# Patient Record
Sex: Male | Born: 1985
Health system: Southern US, Community
[De-identification: ages and names within clinical notes are randomized; demographics above are authoritative.]

## PROBLEM LIST (undated history)

## (undated) DIAGNOSIS — I428 Other cardiomyopathies: Secondary | ICD-10-CM

## (undated) DIAGNOSIS — F1410 Cocaine abuse, uncomplicated: Secondary | ICD-10-CM

## (undated) DIAGNOSIS — Z720 Tobacco use: Secondary | ICD-10-CM

## (undated) DIAGNOSIS — Z9889 Other specified postprocedural states: Secondary | ICD-10-CM

## (undated) DIAGNOSIS — I5020 Unspecified systolic (congestive) heart failure: Secondary | ICD-10-CM

## (undated) DIAGNOSIS — R002 Palpitations: Secondary | ICD-10-CM

## (undated) DIAGNOSIS — G4730 Sleep apnea, unspecified: Secondary | ICD-10-CM

## (undated) DIAGNOSIS — I509 Heart failure, unspecified: Secondary | ICD-10-CM

## (undated) DIAGNOSIS — I514 Myocarditis, unspecified: Secondary | ICD-10-CM

## (undated) HISTORY — DX: Unspecified systolic (congestive) heart failure: I50.20

## (undated) HISTORY — DX: Other cardiomyopathies: I42.8

## (undated) HISTORY — DX: Myocarditis, unspecified: I51.4

## (undated) HISTORY — PX: NO PAST SURGERIES: SHX2092

## (undated) HISTORY — DX: Other specified postprocedural states: Z98.890

## (undated) HISTORY — DX: Heart failure, unspecified: I50.9

## (undated) HISTORY — DX: Cocaine abuse, uncomplicated: F14.10

## (undated) HISTORY — PX: CORONARY ANGIOPLASTY: SHX604

---
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
## 2004-01-03 ENCOUNTER — Ambulatory Visit (HOSPITAL_COMMUNITY): Admission: RE | Admit: 2004-01-03 | Discharge: 2004-01-03 | Payer: Self-pay | Admitting: Internal Medicine

## 2005-03-10 ENCOUNTER — Emergency Department: Payer: Self-pay | Admitting: Unknown Physician Specialty

## 2006-03-17 HISTORY — PX: WISDOM TOOTH EXTRACTION: SHX21

## 2009-05-28 ENCOUNTER — Emergency Department: Payer: Self-pay | Admitting: Emergency Medicine

## 2012-08-03 ENCOUNTER — Emergency Department: Payer: Self-pay | Admitting: Emergency Medicine

## 2012-11-12 ENCOUNTER — Emergency Department: Payer: Self-pay | Admitting: Emergency Medicine

## 2013-07-19 ENCOUNTER — Emergency Department: Payer: Self-pay | Admitting: Emergency Medicine

## 2013-11-22 ENCOUNTER — Emergency Department: Payer: Self-pay | Admitting: Emergency Medicine

## 2014-04-23 ENCOUNTER — Emergency Department: Payer: Self-pay | Admitting: Emergency Medicine

## 2014-05-01 ENCOUNTER — Emergency Department: Payer: Self-pay | Admitting: Student

## 2014-09-04 ENCOUNTER — Emergency Department (HOSPITAL_COMMUNITY): Payer: Self-pay

## 2014-09-04 ENCOUNTER — Emergency Department (HOSPITAL_COMMUNITY)
Admission: EM | Admit: 2014-09-04 | Discharge: 2014-09-04 | Disposition: A | Discharge status: Home / Self Care | Payer: Self-pay | Attending: Emergency Medicine | Admitting: Emergency Medicine

## 2014-09-04 ENCOUNTER — Encounter (HOSPITAL_COMMUNITY): Payer: Self-pay | Admitting: *Deleted

## 2014-09-04 DIAGNOSIS — Z720 Tobacco use: Secondary | ICD-10-CM | POA: Insufficient documentation

## 2014-09-04 DIAGNOSIS — IMO0001 Reserved for inherently not codable concepts without codable children: Secondary | ICD-10-CM

## 2014-09-04 DIAGNOSIS — R1011 Right upper quadrant pain: Secondary | ICD-10-CM | POA: Insufficient documentation

## 2014-09-04 DIAGNOSIS — R630 Anorexia: Secondary | ICD-10-CM | POA: Insufficient documentation

## 2014-09-04 DIAGNOSIS — R030 Elevated blood-pressure reading, without diagnosis of hypertension: Secondary | ICD-10-CM | POA: Insufficient documentation

## 2014-09-04 LAB — COMPREHENSIVE METABOLIC PANEL
ALT: 56 U/L (ref 17–63)
AST: 88 U/L — ABNORMAL HIGH (ref 15–41)
Albumin: 3.5 g/dL (ref 3.5–5.0)
Alkaline Phosphatase: 87 U/L (ref 38–126)
Anion gap: 7 (ref 5–15)
BUN: 12 mg/dL (ref 6–20)
CO2: 28 mmol/L (ref 22–32)
Calcium: 8.9 mg/dL (ref 8.9–10.3)
Chloride: 104 mmol/L (ref 101–111)
Creatinine, Ser: 1.24 mg/dL (ref 0.61–1.24)
GFR calc Af Amer: >60 mL/min (ref >60)
GFR calc non Af Amer: >60 mL/min (ref >60)
Glucose, Bld: 107 mg/dL — ABNORMAL HIGH (ref 65–99)
Potassium: 3.6 mmol/L (ref 3.5–5.1)
Sodium: 139 mmol/L (ref 135–145)
Total Bilirubin: 0.5 mg/dL (ref 0.3–1.2)
Total Protein: 7 g/dL (ref 6.5–8.1)

## 2014-09-04 LAB — CBC WITH DIFFERENTIAL/PLATELET
Basophils Absolute: 0 10*3/uL (ref 0.0–0.1)
Basophils Relative: 1 % (ref 0–1)
Eosinophils Absolute: 0.1 10*3/uL (ref 0.0–0.7)
Eosinophils Relative: 1 % (ref 0–5)
HCT: 40.3 % (ref 39.0–52.0)
Hemoglobin: 13.7 g/dL (ref 13.0–17.0)
Lymphocytes Relative: 35 % (ref 12–46)
Lymphs Abs: 3.1 10*3/uL (ref 0.7–4.0)
MCH: 29.9 pg (ref 26.0–34.0)
MCHC: 34 g/dL (ref 30.0–36.0)
MCV: 88 fL (ref 78.0–100.0)
Monocytes Absolute: 0.5 10*3/uL (ref 0.1–1.0)
Monocytes Relative: 5 % (ref 3–12)
Neutro Abs: 5 10*3/uL (ref 1.7–7.7)
Neutrophils Relative %: 58 % (ref 43–77)
Platelets: 261 10*3/uL (ref 150–400)
RBC: 4.58 MIL/uL (ref 4.22–5.81)
RDW: 12.1 % (ref 11.5–15.5)
WBC: 8.7 10*3/uL (ref 4.0–10.5)

## 2014-09-04 LAB — LIPASE, BLOOD: Lipase: 47 U/L (ref 22–51)

## 2014-09-04 MED ORDER — MORPHINE SULFATE 4 MG/ML IJ SOLN
4.0000 mg | Freq: Once | INTRAMUSCULAR | Status: AC
  Administered 2014-09-04: 4 mg via INTRAVENOUS
  Filled 2014-09-04: qty 1

## 2014-09-04 MED ORDER — ONDANSETRON HCL 4 MG/2ML IJ SOLN
4.0000 mg | Freq: Once | INTRAMUSCULAR | Status: AC
  Administered 2014-09-04: 4 mg via INTRAVENOUS
  Filled 2014-09-04: qty 2

## 2014-09-04 MED ORDER — NAPROXEN 250 MG PO TABS: 250.0000 mg | ORAL_TABLET | Freq: Two times a day (BID) | ORAL | Status: DC

## 2014-09-04 MED ORDER — ONDANSETRON 4 MG PO TBDP
4.0000 mg | ORAL_TABLET | Freq: Three times a day (TID) | ORAL | Status: DC | PRN
Start: 2014-09-04 — End: 2016-07-10

## 2014-09-04 NOTE — ED Provider Notes (Signed)
CSN: [id number]     Arrival date & time 09/04/14  0654 History   First MD Initiated Contact with Patient 09/04/14 [phone]     Chief Complaint  Patient presents with  . Chest Pain   Carlos Brewer. is a 29 y.o. male who is otherwise healthy who presents to the ED complaining of right sided chest pain and right upper quadrant abdominal pain for the past 3 days and worsened last night. He his currently complaining of 7/10 right chest and RUQ pain that is worse with deep breath. He denies radiation to his back or shoulder. He describes his pain as pressure. He has taken nothing for pain today. He reports his pain increased last night about 4-5 hours after eating. He last ate a ham and cheese this morning at 0300. He denies previous abdominal surgeries. The patient denies personal or family history of MI. Patient is a smoker and smokes 5 cigarettes per day. Patient reports was utilized to enter the past days. The patient denies fevers, chills, injuries to his chest, shortness of breath, nausea, vomiting, diarrhea, constipation, palpitations, cough, hemoptysis, wheezing, urinary symptoms, previous abdominal surgeries. He denies history of hypertension.  (Consider location/radiation/quality/duration/timing/severity/associated sxs/prior Treatment) HPI  History reviewed. No pertinent past medical history. History reviewed. No pertinent past surgical history. History reviewed. No pertinent family history. History  Substance Use Topics  . Smoking status: Current Every Day Smoker -- 0.50 packs/day    Types: Cigarettes  . Smokeless tobacco: Never Used  . Alcohol Use: No    Review of Systems  Constitutional: Positive for appetite change (decrease ). Negative for fever and chills.  HENT: Negative for congestion and sore throat.   Eyes: Negative for visual disturbance.  Respiratory: Negative for cough, shortness of breath and wheezing.   Cardiovascular: Positive for chest pain. Negative for  palpitations and leg swelling.  Gastrointestinal: Positive for abdominal pain. Negative for nausea, vomiting and diarrhea.  Genitourinary: Negative for dysuria, hematuria and difficulty urinating.  Musculoskeletal: Negative for back pain and neck pain.  Skin: Negative for rash.  Neurological: Negative for dizziness, weakness and light-headedness.      Allergies  Review of patient's allergies indicates no known allergies.  Home Medications   Prior to Admission medications   Medication Sig Start Date End Date Taking? Authorizing Provider  naproxen (NAPROSYN) 250 MG tablet Take 1 tablet (250 mg total) by mouth 2 (two) times daily with a meal. 09/04/14   Waynetta Pean, PA-C  ondansetron (ZOFRAN ODT) 4 MG disintegrating tablet Take 1 tablet (4 mg total) by mouth every 8 (eight) hours as needed for nausea or vomiting. 09/04/14   Waynetta Pean, PA-C   BP 148/93 mmHg  Pulse 67  Temp(Src) 98.7 F (37.1 C) (Oral)  Resp 17  Ht 5\' 11"  (1.803 m)  Wt 280 lb (127.007 kg)  BMI 39.07 kg/m2  SpO2 95% Physical Exam  Constitutional: He is oriented to person, place, and time. He appears well-developed and well-nourished. No distress.  Nontoxic appearing.  HENT:  Head: Normocephalic and atraumatic.  Right Ear: External ear normal.  Left Ear: External ear normal.  Mouth/Throat: Oropharynx is clear and moist. No oropharyngeal exudate.  Eyes: Conjunctivae are normal. Pupils are equal, round, and reactive to light. Right eye exhibits no discharge. Left eye exhibits no discharge.  Neck: Normal range of motion. Neck supple. No JVD present. No tracheal deviation present.  Cardiovascular: Normal rate, regular rhythm, normal heart sounds and intact distal pulses.  Exam reveals no  gallop and no friction rub.   No murmur heard. Bilateral radial pulses are intact.  Pulmonary/Chest: Effort normal and breath sounds normal. No respiratory distress. He has no wheezes. He has no rales. He exhibits no tenderness.   Lungs are clear to auscultation bilaterally. No chest tenderness.  Abdominal: Soft. Bowel sounds are normal. He exhibits no distension and no mass. There is tenderness. There is no rebound and no guarding.  Abdomen is soft. Bowel sounds are present. Patient with right upper quadrant abdominal tenderness with positive Murphy's sign. No right lower quadrant tenderness. No epigastric tenderness. Negative psoas and obturator sign.  Musculoskeletal: He exhibits no edema or tenderness.  No lower extremity edema or tenderness.  Lymphadenopathy:    He has no cervical adenopathy.  Neurological: He is alert and oriented to person, place, and time. Coordination normal.  Skin: Skin is warm and dry. No rash noted. He is not diaphoretic. No erythema. No pallor.  Psychiatric: He has a normal mood and affect. His behavior is normal.  Nursing note and vitals reviewed.   ED Course  Procedures (including critical care time) Labs Review Labs Reviewed  COMPREHENSIVE METABOLIC PANEL - Abnormal; Notable for the following:    Glucose, Bld 107 (*)    AST 88 (*)    All other components within normal limits  LIPASE, BLOOD  CBC WITH DIFFERENTIAL/PLATELET    Imaging Review US Abdomen Complete  09/04/2014   CLINICAL DATA:  Right upper quadrant pain.  EXAM: ULTRASOUND ABDOMEN COMPLETE  COMPARISON:  None.  FINDINGS: Gallbladder: Gallbladder is partially contracted, the patient was not NPO. No gallstones noted. Gallbladder wall thickness 1.7 mm. Negative Murphy sign.  Common bile duct: Diameter: 6.1 mm  Liver: Liver is echogenic consistent with fatty infiltration. Knee area of focal fatty sparing appears be present adjacent to the gallbladder fossa.  IVC: No abnormality visualized.  Pancreas: Visualized portion unremarkable.  Spleen: Size and appearance within normal limits.  Right Kidney: Length: 11.5 cm. Echogenicity within normal limits. No mass or hydronephrosis visualized.  Left Kidney: Length: 11.6 cm.  Echogenicity within normal limits. No mass or hydronephrosis visualized.  Abdominal aorta: No aneurysm visualized.  Other findings: None.  IMPRESSION: Echogenic liver consistent with fatty infiltration and/or hepatocellular disease. Focal fatty sparing noted adjacent to the gallbladder fossa. Exam otherwise unremarkable.   Electronically Signed   By: Marcello Moores  Register   On: 09/04/2014 08:44     EKG Interpretation   Date/Time:  Monday September 04 2014 07:00:58 EDT Ventricular Rate:  87 PR Interval:  174 QRS Duration: 94 QT Interval:  378 QTC Calculation: 455 R Axis:   38 Text Interpretation:  Sinus rhythm Confirmed by ZAVITZ  MD, JOSHUA ([id number])  on 09/04/2014 7:05:09 AM      Filed Vitals:   09/04/14 [id number] 09/04/14 0715 09/04/14 0843 09/04/14 0845  BP: 155/108 146/105 140/103 148/93  Pulse: 89 83 73 67  Temp:      TempSrc:      Resp: 17 24 15 17   Height:      Weight:      SpO2: 99% 99% 98% 95%     MDM   Meds given in ED:  Medications  morphine 4 MG/ML injection 4 mg (4 mg Intravenous Given 09/04/14 0729)  ondansetron (ZOFRAN) injection 4 mg (4 mg Intravenous Given 09/04/14 0729)    New Prescriptions   NAPROXEN (NAPROSYN) 250 MG TABLET    Take 1 tablet (250 mg total) by mouth 2 (two) times daily with a  meal.   ONDANSETRON (ZOFRAN ODT) 4 MG DISINTEGRATING TABLET    Take 1 tablet (4 mg total) by mouth every 8 (eight) hours as needed for nausea or vomiting.    Final diagnoses:  RUQ abdominal pain  Elevated blood pressure   This is a 29 y.o. male who is otherwise healthy who presents to the ED complaining of right sided chest pain and right upper quadrant abdominal pain for the past 3 days and worsened last night. He his currently complaining of 7/10 right chest and RUQ pain that is worse with deep breath. He denies radiation to his back or shoulder. He describes his pain as pressure. He has taken nothing for pain today. He reports his pain increased last night about 4-5 hours after  eating. He last ate a ham and cheese this morning at 0300. On exam patient is afebrile and nontoxic appearing. His heart is regular rate and rhythm. He has no chest wall tenderness. Patient has right upper quadrant abdominal tenderness with a positive Murphy's sign. Patient denies previous abdominal surgeries. CMP is remarkable for mildly elevated AST at 88. His normal lipase. CBC is within normal limits. Abdominal ultrasound indicates an echogenic liver consistent fatty infiltration. There is focal fatty sparing noted adjacent to the gallbladder fossa. The exam is otherwise unremarkable. Is a normal common bile duct diameter. Normal gallbladder wall thickness. No gallstones noted. EKG shows normal sinus rhythm. Patient's pain is consistent with biliary colic. Extensive education provided on food choices to help with his pain. Patient given follow-up instructions with general surgery for further evaluation as well as his primary care provider to further manage his blood pressure. Patient denies history of hypertension. Patient needs to follow-up with his PCP for further check of his blood pressures. I advised the patient to follow-up with their primary care provider this week. I advised the patient to return to the emergency department with new or worsening symptoms or new concerns. The patient verbalized understanding and agreement with plan.    This patient was discussed with and evaluated by Dr. Reather Converse who agrees with assessment and plan.   Waynetta Pean, PA-C 09/04/14 [id number]  Elnora Morrison, MD 09/04/14 [id number]

## 2014-09-04 NOTE — ED Notes (Signed)
Patient transported to Ultrasound 

## 2014-09-04 NOTE — ED Notes (Signed)
Patient still out at [place].

## 2014-09-04 NOTE — ED Notes (Signed)
Patient returned from Ultrasound. 

## 2014-09-04 NOTE — ED Notes (Signed)
PA at bedside.

## 2014-09-04 NOTE — ED Notes (Signed)
Pt has been have right sided chest pain that starts under the breast and radiates down to the stomach. Denies NV and SOB. Pain comes and goes.

## 2014-09-04 NOTE — Discharge Instructions (Signed)
Abdominal Pain Many things can cause abdominal pain. Usually, abdominal pain is not caused by a disease and will improve without treatment. It can often be observed and treated at home. Your health care provider will do a physical exam and possibly order blood tests and X-rays to help determine the seriousness of your pain. However, in many cases, more time must pass before a clear cause of the pain can be found. Before that point, your health care provider may not know if you need more testing or further treatment. HOME CARE INSTRUCTIONS  Monitor your abdominal pain for any changes. The following actions may help to alleviate any discomfort you are experiencing:  Only take over-the-counter or prescription medicines as directed by your health care provider.  Do not take laxatives unless directed to do so by your health care provider.  Try a clear liquid diet (broth, tea, or water) as directed by your health care provider. Slowly move to a bland diet as tolerated. SEEK MEDICAL CARE IF:  You have unexplained abdominal pain.  You have abdominal pain associated with nausea or diarrhea.  You have pain when you urinate or have a bowel movement.  You experience abdominal pain that wakes you in the night.  You have abdominal pain that is worsened or improved by eating food.  You have abdominal pain that is worsened with eating fatty foods.  You have a fever. SEEK IMMEDIATE MEDICAL CARE IF:   Your pain does not go away within 2 hours.  You keep throwing up (vomiting).  Your pain is felt only in portions of the abdomen, such as the right side or the left lower portion of the abdomen.  You pass bloody or black tarry stools. MAKE SURE YOU:  Understand these instructions.   Will watch your condition.   Will get help right away if you are not doing well or get worse.  Document Released: 12/11/2004 Document Revised: 03/08/2013 Document Reviewed: 11/10/2012 [facility] Patient Information  2015 [place], [place]. This information is not intended to replace advice given to you by your health care provider. Make sure you discuss any questions you have with your health care provider.  Biliary Colic  Biliary colic is a steady or irregular pain in the upper abdomen. It is usually under the right side of the rib cage. It happens when gallstones interfere with the normal flow of bile from the gallbladder. Bile is a liquid that helps to digest fats. Bile is made in the liver and stored in the gallbladder. When you eat a meal, bile passes from the gallbladder through the cystic duct and the common bile duct into the small intestine. There, it mixes with partially digested food. If a gallstone blocks either of these ducts, the normal flow of bile is blocked. The muscle cells in the bile duct contract forcefully to try to move the stone. This causes the pain of biliary colic.  SYMPTOMS   A person with biliary colic usually complains of pain in the upper abdomen. This pain can be:  In the center of the upper abdomen just below the breastbone.  In the upper-right part of the abdomen, near the gallbladder and liver.  Spread back toward the right shoulder blade.  Nausea and vomiting.  The pain usually occurs after eating.  Biliary colic is usually triggered by the digestive system's demand for bile. The demand for bile is high after fatty meals. Symptoms can also occur when a person who has been fasting suddenly eats a very  large meal. Most episodes of biliary colic pass after 1 to 5 hours. After the most intense pain passes, your abdomen may continue to ache mildly for about 24 hours. DIAGNOSIS  After you describe your symptoms, your caregiver will perform a physical exam. He or she will pay attention to the upper right portion of your belly (abdomen). This is the area of your liver and gallbladder. An ultrasound will help your caregiver look for gallstones. Specialized scans of the gallbladder may  also be done. Blood tests may be done, especially if you have fever or if your pain persists. PREVENTION  Biliary colic can be prevented by controlling the risk factors for gallstones. Some of these risk factors, such as heredity, increasing age, and pregnancy are a normal part of life. Obesity and a high-fat diet are risk factors you can change through a healthy lifestyle. Women going through menopause who take hormone replacement therapy (estrogen) are also more likely to develop biliary colic. TREATMENT   Pain medication may be prescribed.  You may be encouraged to eat a fat-free diet.  If the first episode of biliary colic is severe, or episodes of colic keep retuning, surgery to remove the gallbladder (cholecystectomy) is usually recommended. This procedure can be done through small incisions using an instrument called a laparoscope. The procedure often requires a brief stay in the hospital. Some people can leave the hospital the same day. It is the most widely used treatment in people troubled by painful gallstones. It is effective and safe, with no complications in more than 90% of cases.  If surgery cannot be done, medication that dissolves gallstones may be used. This medication is expensive and can take months or years to work. Only small stones will dissolve.  Rarely, medication to dissolve gallstones is combined with a procedure called shock-wave lithotripsy. This procedure uses carefully aimed shock waves to break up gallstones. In many people treated with this procedure, gallstones form again within a few years. PROGNOSIS  If gallstones block your cystic duct or common bile duct, you are at risk for repeated episodes of biliary colic. There is also a 25% chance that you will develop a gallbladder infection(acute cholecystitis), or some other complication of gallstones within 10 to 20 years. If you have surgery, schedule it at a time that is convenient for you and at a time when you are  not sick. HOME CARE INSTRUCTIONS   Drink plenty of clear fluids.  Avoid fatty, greasy or fried foods, or any foods that make your pain worse.  Take medications as directed. SEEK MEDICAL CARE IF:   You develop a fever over 100.5 F (38.1 C).  Your pain gets worse over time.  You develop nausea that prevents you from eating and drinking.  You develop vomiting. SEEK IMMEDIATE MEDICAL CARE IF:   You have continuous or severe belly (abdominal) pain which is not relieved with medications.  You develop nausea and vomiting which is not relieved with medications.  You have symptoms of biliary colic and you suddenly develop a fever and shaking chills. This may signal cholecystitis. Call your caregiver immediately.  You develop a yellow color to your skin or the white part of your eyes (jaundice). Document Released: 08/04/2005 Document Revised: 05/26/2011 Document Reviewed: 10/14/2007 [facility] Patient Information 2015 [place], [place]. This information is not intended to replace advice given to you by your health care provider. Make sure you discuss any questions you have with your health care provider.   DASH Eating Plan  DASH stands for "Dietary Approaches to Stop Hypertension." The DASH eating plan is a healthy eating plan that has been shown to reduce high blood pressure (hypertension). Additional health benefits may include reducing the risk of type 2 diabetes mellitus, heart disease, and stroke. The DASH eating plan may also help with weight loss. WHAT DO I NEED TO KNOW ABOUT THE DASH EATING PLAN? For the DASH eating plan, you will follow these general guidelines:  Choose foods with a percent daily value for sodium of less than 5% (as listed on the food label).  Use salt-free seasonings or herbs instead of table salt or sea salt.  Check with your health care provider or pharmacist before using salt substitutes.  Eat lower-sodium products, often labeled as "lower sodium" or "no salt  added."  Eat fresh foods.  Eat more vegetables, fruits, and low-fat dairy products.  Choose whole grains. Look for the word "whole" as the first word in the ingredient list.  Choose fish and skinless chicken or [place] more often than red meat. Limit fish, poultry, and meat to 6 oz (170 g) each day.  Limit sweets, desserts, sugars, and sugary drinks.  Choose heart-healthy fats.  Limit cheese to 1 oz (28 g) per day.  Eat more home-cooked food and less restaurant, buffet, and fast food.  Limit fried foods.  Cook foods using methods other than frying.  Limit canned vegetables. If you do use them, rinse them well to decrease the sodium.  When eating at a restaurant, ask that your food be prepared with less salt, or no salt if possible. WHAT FOODS CAN I EAT? Seek help from a dietitian for individual calorie needs. Grains Whole grain or whole wheat bread. Brown rice. Whole grain or whole wheat pasta. Quinoa, bulgur, and whole grain cereals. Low-sodium cereals. Corn or whole wheat flour tortillas. Whole grain cornbread. Whole grain crackers. Low-sodium crackers. Vegetables Fresh or frozen vegetables (raw, steamed, roasted, or grilled). Low-sodium or reduced-sodium tomato and vegetable juices. Low-sodium or reduced-sodium tomato sauce and paste. Low-sodium or reduced-sodium canned vegetables.  Fruits All fresh, canned (in natural juice), or frozen fruits. Meat and Other Protein Products Ground beef (85% or leaner), grass-fed beef, or beef trimmed of fat. Skinless chicken or [place]. Ground chicken or [place]. Pork trimmed of fat. All fish and seafood. Eggs. Dried beans, peas, or lentils. Unsalted nuts and seeds. Unsalted canned beans. Dairy Low-fat dairy products, such as skim or 1% milk, 2% or reduced-fat cheeses, low-fat ricotta or cottage cheese, or plain low-fat yogurt. Low-sodium or reduced-sodium cheeses. Fats and Oils Tub margarines without trans fats. Light or reduced-fat  mayonnaise and salad dressings (reduced sodium). Avocado. Safflower, olive, or canola oils. Natural peanut or almond butter. Other Unsalted popcorn and pretzels. The items listed above may not be a complete list of recommended foods or beverages. Contact your dietitian for more options. WHAT FOODS ARE NOT RECOMMENDED? Grains White bread. White pasta. White rice. Refined cornbread. Bagels and croissants. Crackers that contain trans fat. Vegetables Creamed or fried vegetables. Vegetables in a cheese sauce. Regular canned vegetables. Regular canned tomato sauce and paste. Regular tomato and vegetable juices. Fruits Dried fruits. Canned fruit in light or heavy syrup. Fruit juice. Meat and Other Protein Products Fatty cuts of meat. Ribs, chicken wings, bacon, sausage, bologna, salami, chitterlings, fatback, hot dogs, bratwurst, and packaged luncheon meats. Salted nuts and seeds. Canned beans with salt. Dairy Whole or 2% milk, cream, half-and-half, and cream cheese. Whole-fat or sweetened yogurt. Full-fat cheeses or  blue cheese. Nondairy creamers and whipped toppings. Processed cheese, cheese spreads, or cheese curds. Condiments Onion and garlic salt, seasoned salt, table salt, and sea salt. Canned and packaged gravies. Worcestershire sauce. Tartar sauce. Barbecue sauce. Teriyaki sauce. Soy sauce, including reduced sodium. Steak sauce. Fish sauce. Oyster sauce. Cocktail sauce. Horseradish. Ketchup and mustard. Meat flavorings and tenderizers. Bouillon cubes. Hot sauce. Tabasco sauce. Marinades. Taco seasonings. Relishes. Fats and Oils Butter, stick margarine, lard, shortening, ghee, and bacon fat. Coconut, palm kernel, or palm oils. Regular salad dressings. Other Pickles and olives. Salted popcorn and pretzels. The items listed above may not be a complete list of foods and beverages to avoid. Contact your dietitian for more information. WHERE CAN I FIND MORE INFORMATION? National [employer], [employer], and  [employer] Institute: travelstabloid.com Document Released: 02/20/2011 Document Revised: 07/18/2013 Document Reviewed: 01/05/2013 [facility] Patient Information 2015 [place], [place]. This information is not intended to replace advice given to you by your health care provider. Make sure you discuss any questions you have with your health care provider.

## 2014-09-04 NOTE — ED Notes (Signed)
MD at bedside. 

## 2015-11-01 ENCOUNTER — Emergency Department
Admission: EM | Admit: 2015-11-01 | Discharge: 2015-11-01 | Disposition: A | Discharge status: Home / Self Care | Payer: Self-pay | Attending: Emergency Medicine | Admitting: Emergency Medicine

## 2015-11-01 ENCOUNTER — Emergency Department: Payer: Self-pay

## 2015-11-01 ENCOUNTER — Encounter: Payer: Self-pay | Admitting: Emergency Medicine

## 2015-11-01 DIAGNOSIS — R51 Headache: Secondary | ICD-10-CM | POA: Insufficient documentation

## 2015-11-01 DIAGNOSIS — R519 Headache, unspecified: Secondary | ICD-10-CM

## 2015-11-01 DIAGNOSIS — F17210 Nicotine dependence, cigarettes, uncomplicated: Secondary | ICD-10-CM | POA: Insufficient documentation

## 2015-11-01 DIAGNOSIS — R350 Frequency of micturition: Secondary | ICD-10-CM | POA: Insufficient documentation

## 2015-11-01 LAB — URINALYSIS COMPLETE WITH MICROSCOPIC (ARMC ONLY)
Bacteria, UA: NONE SEEN
Bilirubin Urine: NEGATIVE
Glucose, UA: NEGATIVE mg/dL
Hgb urine dipstick: NEGATIVE
Ketones, ur: NEGATIVE mg/dL
Leukocytes, UA: NEGATIVE
Nitrite: NEGATIVE
Protein, ur: NEGATIVE mg/dL
RBC / HPF: NONE SEEN RBC/hpf (ref 0–5)
Specific Gravity, Urine: 1.015 (ref 1.005–1.030)
Squamous Epithelial / LPF: NONE SEEN
pH: 7 (ref 5.0–8.0)

## 2015-11-01 LAB — CHLAMYDIA/NGC RT PCR (ARMC ONLY)
Chlamydia Tr: NOT DETECTED
N gonorrhoeae: NOT DETECTED

## 2015-11-01 LAB — GLUCOSE, CAPILLARY: Glucose-Capillary: 107 mg/dL — ABNORMAL HIGH (ref 65–99)

## 2015-11-01 MED ORDER — OXYCODONE-ACETAMINOPHEN 5-325 MG PO TABS
ORAL_TABLET | ORAL | Status: AC
  Filled 2015-11-01: qty 1

## 2015-11-01 MED ORDER — PROCHLORPERAZINE EDISYLATE 5 MG/ML IJ SOLN
10.0000 mg | Freq: Once | INTRAMUSCULAR | Status: AC
  Administered 2015-11-01: 10 mg via INTRAVENOUS
  Filled 2015-11-01: qty 2

## 2015-11-01 MED ORDER — SODIUM CHLORIDE 0.9 % IV BOLUS (SEPSIS)
1000.0000 mL | Freq: Once | INTRAVENOUS | Status: AC
  Administered 2015-11-01: 1000 mL via INTRAVENOUS

## 2015-11-01 MED ORDER — KETOROLAC TROMETHAMINE 30 MG/ML IJ SOLN
30.0000 mg | Freq: Once | INTRAMUSCULAR | Status: AC
  Administered 2015-11-01: 30 mg via INTRAVENOUS
  Filled 2015-11-01: qty 1

## 2015-11-01 MED ORDER — OXYCODONE-ACETAMINOPHEN 5-325 MG PO TABS
1.0000 | ORAL_TABLET | ORAL | Status: DC | PRN
  Administered 2015-11-01: 1 via ORAL

## 2015-11-01 MED ORDER — DIPHENHYDRAMINE HCL 50 MG/ML IJ SOLN
50.0000 mg | Freq: Once | INTRAMUSCULAR | Status: AC
  Administered 2015-11-01: 50 mg via INTRAVENOUS
  Filled 2015-11-01: qty 1

## 2015-11-01 NOTE — ED Triage Notes (Signed)
C/O headache since Sunday.  Also c/o nausea, chills, dizziness.  C/O pain to forehead and eyes.

## 2015-11-01 NOTE — ED Provider Notes (Signed)
[facility] Emergency Department Provider Note  ____________________________________________   First MD Initiated Contact with Patient 11/01/15 1525     (approximate)  I have reviewed the triage vital signs and the nursing notes.   HISTORY  Chief Complaint Migraine   HPI Carlos Brewer. is a 30 y.o. male without any chronic medical conditions was presenting to the emergency department with 3 days of worsening frontal headache. He says it feels like a throbbing and is a 78 out of 10 at this time. He says that it started out mild and has been progressively worse. Denies any fevers but says he has had chills. Says that he is also had some dizziness as well as nausea. Says that he does not a history of migraine headaches.  Denies any weakness or numbness. Says that he does feel restless.   History reviewed. No pertinent past medical history.  There are no active problems to display for this patient.   History reviewed. No pertinent surgical history.  Prior to Admission medications   Medication Sig Start Date End Date Taking? Authorizing Provider  naproxen (NAPROSYN) 250 MG tablet Take 1 tablet (250 mg total) by mouth 2 (two) times daily with a meal. 09/04/14   Waynetta Pean, PA-C  ondansetron (ZOFRAN ODT) 4 MG disintegrating tablet Take 1 tablet (4 mg total) by mouth every 8 (eight) hours as needed for nausea or vomiting. 09/04/14   Waynetta Pean, PA-C    Allergies Review of patient's allergies indicates no known allergies.  No family history on file.  Social History Social History  Substance Use Topics  . Smoking status: Current Every Day Smoker    Packs/day: 0.50    Types: Cigarettes  . Smokeless tobacco: Never Used  . Alcohol use No    Review of Systems Constitutional: No fever/chills Eyes: No visual changes. ENT: No sore throat. Cardiovascular: Denies chest pain. Respiratory: Denies shortness of breath. Gastrointestinal: No  abdominal pain.  no vomiting.  No diarrhea.  No constipation. Genitourinary: Negative for dysuria. Musculoskeletal: Negative for back pain. Skin: Negative for rash. Neurological: Negative for  focal weakness or numbness.  10-point ROS otherwise negative.  ____________________________________________   PHYSICAL EXAM:  VITAL SIGNS: ED Triage Vitals  Enc Vitals Group     BP 11/01/15 1337 (!) 139/103     Pulse Rate 11/01/15 1337 91     Resp 11/01/15 1337 20     Temp 11/01/15 1337 97.6 F (36.4 C)     Temp Source 11/01/15 1337 Oral     SpO2 11/01/15 1337 100 %     Weight 11/01/15 1337 270 lb (122.5 kg)     Height 11/01/15 1337 5\' 11"  (1.803 m)     Head Circumference --      Peak Flow --      Pain Score 11/01/15 1343 8     Pain Loc --      Pain Edu? --      Excl. in [place]? --     Constitutional: Alert and oriented. Well appearing and in no acute distress. Eyes: Conjunctivae are normal. PERRL. EOMI. Head: Atraumatic. Nose: No congestion/rhinnorhea. Mouth/Throat: Mucous membranes are moist.   Neck: No stridor.   Cardiovascular: Normal rate, regular rhythm. Grossly normal heart sounds.   Respiratory: Normal respiratory effort.  No retractions. Lungs CTAB. Gastrointestinal: Soft and nontender. No distention. Musculoskeletal: No lower extremity tenderness nor edema.  No joint effusions. Neurologic:  Normal speech and language. No gross focal neurologic deficits  are appreciated.  Skin:  Skin is warm, dry and intact. No rash noted. Psychiatric: Mood and affect are normal. Speech and behavior are normal.  ____________________________________________   LABS (all labs ordered are listed, but only abnormal results are displayed)  Labs Reviewed - No data to display ____________________________________________  EKG   ____________________________________________  RADIOLOGY  CT Head Wo Contrast (Accession [id number]) (Order [id number])  Imaging  Date: 11/01/2015 Department:  [facility] EMERGENCY DEPARTMENT Released By/Authorizing: Orbie Pyo, MD (auto-released)  PACS Images   Show images for CT Head Wo Contrast  Study Result   CLINICAL DATA:  Headache for 4 days. No relief with NSAIDs. Light sensitivity.  EXAM: CT HEAD WITHOUT CONTRAST  TECHNIQUE: Contiguous axial images were obtained from the base of the skull through the vertex without intravenous contrast.  COMPARISON:  None.  FINDINGS: Brain: No acute infarct, hemorrhage, or mass lesion is present. The ventricles are of normal size. No significant extraaxial fluid collection is present.  Vascular: No focal hyperdense vessels are present. There is no significant vascular calcification.  Skull: The calvarium is intact.  Sinuses/Orbits: The paranasal sinuses and mastoid air cells are clear. The globes and orbits are intact.  Other: No significant extracranial soft tissue lesion is present.  IMPRESSION: 1. Negative CT of the head.   Electronically Signed   By: San Morelle M.D.   On: 11/01/2015 16:44     ____________________________________________   PROCEDURES  Procedure(s) performed:  Angiocath insertion Performed by: Doran Stabler  Consent: Verbal consent obtained. Risks and benefits: risks, benefits and alternatives were discussed Time out: Immediately prior to procedure a "time out" was called to verify the correct patient, procedure, equipment, support staff and site/side marked as required.  Preparation: Patient was prepped and draped in the usual sterile fashion.  Vein Location: Right anterior fossa  Ultrasound Guided  Gauge: 20  Normal blood return and flush without difficulty Patient tolerance: Patient tolerated the procedure well with no immediate complications.    Procedures  Critical Care performed:   ____________________________________________   INITIAL IMPRESSION / ASSESSMENT AND PLAN /  ED COURSE  Pertinent labs & imaging results that were available during my care of the patient were reviewed by me and considered in my medical decision making (see chart for details).  ----------------------------------------- 7:01 PM on 11/01/2015 -----------------------------------------  Patient with resolution of his headache after the medicated. He is sedated after having the Benadryl but is arousable. Likely tension headache. Very reassuring CAT scan. Patient is saying that he has urinary frequency but with only very mildly elevated glucose and a reassuring urinalysis. I updated the patient about this as well as need for follow-up with primary care. The patient has primary care at the [facility]. He will be following her. Will be discharged home.  Clinical Course     ____________________________________________   FINAL CLINICAL IMPRESSION(S) / ED DIAGNOSES  Headache. Urinary frequency.    NEW MEDICATIONS STARTED DURING THIS VISIT:  New Prescriptions   No medications on file     Note:  This document was prepared using Dragon voice recognition software and may include unintentional dictation errors.    Orbie Pyo, MD 11/01/15 [phone]

## 2015-11-01 NOTE — ED Notes (Signed)
Pt in via triage with complaints of a headache since Sunday, taking ibuprofen but with no relief.  Pt states, "I just can't stay still."  Pt reports light sensitivity, denies any changes to vision.  Pt A/Ox4, no immediate distress at this time

## 2016-06-01 ENCOUNTER — Emergency Department
Admission: EM | Admit: 2016-06-01 | Discharge: 2016-06-01 | Disposition: A | Discharge status: Home / Self Care | Payer: Commercial Managed Care - PPO | Attending: Emergency Medicine | Admitting: Emergency Medicine

## 2016-06-01 ENCOUNTER — Emergency Department: Payer: Commercial Managed Care - PPO

## 2016-06-01 ENCOUNTER — Encounter: Payer: Self-pay | Admitting: Emergency Medicine

## 2016-06-01 DIAGNOSIS — X501XXA Overexertion from prolonged static or awkward postures, initial encounter: Secondary | ICD-10-CM | POA: Diagnosis not present

## 2016-06-01 DIAGNOSIS — S99911A Unspecified injury of right ankle, initial encounter: Secondary | ICD-10-CM | POA: Diagnosis present

## 2016-06-01 DIAGNOSIS — Y9389 Activity, other specified: Secondary | ICD-10-CM | POA: Diagnosis not present

## 2016-06-01 DIAGNOSIS — F17210 Nicotine dependence, cigarettes, uncomplicated: Secondary | ICD-10-CM | POA: Diagnosis not present

## 2016-06-01 DIAGNOSIS — Y929 Unspecified place or not applicable: Secondary | ICD-10-CM | POA: Insufficient documentation

## 2016-06-01 DIAGNOSIS — S93491A Sprain of other ligament of right ankle, initial encounter: Secondary | ICD-10-CM | POA: Insufficient documentation

## 2016-06-01 DIAGNOSIS — Y990 Civilian activity done for income or pay: Secondary | ICD-10-CM | POA: Insufficient documentation

## 2016-06-01 MED ORDER — IBUPROFEN 600 MG PO TABS: 600.0000 mg | ORAL_TABLET | Freq: Three times a day (TID) | ORAL | 0 refills | Status: DC | PRN

## 2016-06-01 NOTE — ED Notes (Signed)
When brace was put on patients rt. Ankle, pt. Stated noticeable decrease in pain.  Pt. Will follow up with PCP if condition worsens.

## 2016-06-01 NOTE — ED Notes (Signed)
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  Pt. Has swelling to outside of rt. Ankle.  No deformity noted.  No laceration noted.

## 2016-06-01 NOTE — Discharge Instructions (Signed)
It is normal for an ankle sprain to take up to a month to fully heal. Your pain will be worse tomorrow in the next day. Please keep your ankle wrapped and take ibuprofen as needed for pain control. Please establish care with a primary care physician within one week for recheck as needed.  It was a pleasure to take care of you today, and thank you for coming to our emergency department.  If you have any questions or concerns before leaving please ask the nurse to grab me and I'm more than happy to go through your aftercare instructions again.  If you were prescribed any opioid pain medication today such as Norco, Vicodin, Percocet, morphine, hydrocodone, or oxycodone please make sure you do not drive when you are taking this medication as it can alter your ability to drive safely.  If you have any concerns once you are home that you are not improving or are in fact getting worse before you can make it to your follow-up appointment, please do not hesitate to call 911 and come back for further evaluation.  Darel Hong MD  Results for orders placed or performed during the hospital encounter of 11/01/15  [place] rt PCR ([place] only)  Result Value Ref Range   Specimen source GC/Chlam ENDOCERVICAL    Chlamydia Tr NOT DETECTED NOT DETECTED   N gonorrhoeae NOT DETECTED NOT DETECTED  Glucose, capillary  Result Value Ref Range   Glucose-Capillary 107 (H) 65 - 99 mg/dL  Urinalysis complete, with microscopic ([facility] only)  Result Value Ref Range   Color, Urine YELLOW (A) YELLOW   APPearance CLOUDY (A) CLEAR   Glucose, UA NEGATIVE NEGATIVE mg/dL   Bilirubin Urine NEGATIVE NEGATIVE   Ketones, ur NEGATIVE NEGATIVE mg/dL   Specific Gravity, Urine 1.015 1.005 - 1.030   Hgb urine dipstick NEGATIVE NEGATIVE   pH 7.0 5.0 - 8.0   Protein, ur NEGATIVE NEGATIVE mg/dL   Nitrite NEGATIVE NEGATIVE   Leukocytes, UA NEGATIVE NEGATIVE   RBC / HPF NONE SEEN 0 - 5 RBC/hpf   WBC, UA 0-5 0 - 5 WBC/hpf   Bacteria, UA NONE SEEN NONE SEEN   Squamous Epithelial / LPF NONE SEEN NONE SEEN   Dg Ankle Complete Right  Result Date: 06/01/2016 CLINICAL DATA:  Ankle twisting injury EXAM: RIGHT ANKLE - COMPLETE 3+ VIEW COMPARISON:  None. FINDINGS: There is moderate lateral right ankle soft tissue swelling. The ankle mortise is approximated. There is no acute fracture. No ankle effusion. IMPRESSION: Moderate lateral right ankle soft tissue swelling. No acute osseous abnormality. Electronically Signed   By: Ulyses Jarred M.D.   On: 06/01/2016 01:30

## 2016-06-01 NOTE — ED Provider Notes (Addendum)
[facility] Emergency Department Provider Note  ____________________________________________   First MD Initiated Contact with Patient 06/01/16 [phone]     (approximate)  I have reviewed the triage vital signs and the nursing notes.   HISTORY  Chief Complaint Ankle Pain    HPI Carlos Brewer. is a 31 y.o. male who comes to the emergency department with moderate severity nonradiating sharp aching right lateral ankle pain that happened an hour prior to arrival when he rolled his ankle while getting out of a truck. He has been able to ambulate since. No knee pain. No numbness or weakness. He did not fall and strike his head.   History reviewed. No pertinent past medical history.  There are no active problems to display for this patient.   History reviewed. No pertinent surgical history.  Prior to Admission medications   Medication Sig Start Date End Date Taking? Authorizing Provider  ibuprofen (ADVIL,MOTRIN) 600 MG tablet Take 1 tablet (600 mg total) by mouth every 8 (eight) hours as needed. 06/01/16   Darel Hong, MD  naproxen (NAPROSYN) 250 MG tablet Take 1 tablet (250 mg total) by mouth 2 (two) times daily with a meal. 09/04/14   Waynetta Pean, PA-C  ondansetron (ZOFRAN ODT) 4 MG disintegrating tablet Take 1 tablet (4 mg total) by mouth every 8 (eight) hours as needed for nausea or vomiting. 09/04/14   Waynetta Pean, PA-C    Allergies Patient has no known allergies.  No family history on file.  Social History Social History  Substance Use Topics  . Smoking status: Current Every Day Smoker    Packs/day: 0.50    Types: Cigarettes  . Smokeless tobacco: Never Used  . Alcohol use No    Review of Systems Constitutional: No fever/chills Eyes: No visual changes. ENT: No sore throat. Cardiovascular: Denies chest pain. Respiratory: Denies shortness of breath. Gastrointestinal: No abdominal pain.  No nausea, no vomiting.  No diarrhea.  No  constipation. Genitourinary: Negative for dysuria. Musculoskeletal: Negative for back pain. Skin: Negative for rash. Neurological: Negative for headaches, focal weakness or numbness.  10-point ROS otherwise negative.  ____________________________________________   PHYSICAL EXAM:  VITAL SIGNS: ED Triage Vitals [06/01/16 0025]  Enc Vitals Group     BP      Pulse      Resp      Temp      Temp src      SpO2      Weight 210 lb (95.3 kg)     Height 5\' 11"  (1.803 m)     Head Circumference      Peak Flow      Pain Score 6     Pain Loc      Pain Edu?      Excl. in [place]?     Constitutional: Alert and oriented x 4 well appearing nontoxic no diaphoresis speaks in full, clear sentences Eyes: PERRL EOMI. Head: Atraumatic. Nose: No congestion/rhinnorhea. Mouth/Throat: No trismus Neck: No stridor.   Respiratory: Normal respiratory effort.  Musculoskeletal: No tenderness over medial mal some tenderness over lateral malleolus with significant tenderness over his ATFL. He can fire EHL, EDL, FHL, FDL, TA, G.  SILT sural, saphenous, tibial, dp, sp.  Skin closed, compartments soft No tenderness over proximal tibia or fibula Neurologic:  Normal speech and language. No gross focal neurologic deficits are appreciated. Skin:  Skin is warm, dry and intact. No rash noted. Psychiatric: Mood and affect are normal. Speech and behavior are  normal.    ____________________________________________   DIFFERENTIAL  Ankle sprain, ankle fracture, ankle dislocation ____________________________________________   LABS (all labs ordered are listed, but only abnormal results are displayed)  Labs Reviewed - No data to display   __________________________________________  EKG   ____________________________________________  RADIOLOGY  X-ray negative for fracture or dislocation ____________________________________________   PROCEDURES  Procedure(s) performed: yes  SPLINT  APPLICATION Authorized by: Darel Hong Consent: Verbal consent obtained. Risks and benefits: risks, benefits and alternatives were discussed Consent given by: patient Splint applied by: er technician Location details: right ankle Splint type: short leg wrap Post-procedure: The splinted body part was neurovascularly unchanged following the procedure. Patient tolerance: Patient tolerated the procedure well with no immediate complications.     Procedures  Critical Care performed: no  ____________________________________________   INITIAL IMPRESSION / ASSESSMENT AND PLAN / ED COURSE  Pertinent labs & imaging results that were available during my care of the patient were reviewed by me and considered in my medical decision making (see chart for details).  By the time I had seen the patient here any had x-rays which were negative for acute fracture dislocation. He is most tender over his ATFL which is consistent with an ankle sprain. He is able to bear weight but with some discomfort. Placed him in a splint for comfort and he will be discharged home with primary care follow-up. He is neurovascularly intact. He is stable to bear weight.      ____________________________________________   FINAL CLINICAL IMPRESSION(S) / ED DIAGNOSES  Final diagnoses:  Sprain of anterior talofibular ligament of right ankle, initial encounter      NEW MEDICATIONS STARTED DURING THIS VISIT:  Discharge Medication List as of 06/01/2016  3:54 AM    START taking these medications   Details  ibuprofen (ADVIL,MOTRIN) 600 MG tablet Take 1 tablet (600 mg total) by mouth every 8 (eight) hours as needed., Starting Sun 06/01/2016, Print         Note:  This document was prepared using Dragon voice recognition software and may include unintentional dictation errors.     Darel Hong, MD 06/05/16 [place], MD 06/05/16 [phone]

## 2016-06-01 NOTE — ED Triage Notes (Signed)
Patient states that he "rolled" his right ankle.

## 2016-06-23 ENCOUNTER — Emergency Department: Payer: Commercial Managed Care - PPO

## 2016-06-23 ENCOUNTER — Emergency Department
Admission: EM | Admit: 2016-06-23 | Discharge: 2016-06-23 | Disposition: A | Discharge status: Home / Self Care | Payer: Commercial Managed Care - PPO | Attending: Emergency Medicine | Admitting: Emergency Medicine

## 2016-06-23 ENCOUNTER — Encounter: Payer: Self-pay | Admitting: Emergency Medicine

## 2016-06-23 DIAGNOSIS — F17210 Nicotine dependence, cigarettes, uncomplicated: Secondary | ICD-10-CM | POA: Diagnosis not present

## 2016-06-23 DIAGNOSIS — R0789 Other chest pain: Secondary | ICD-10-CM | POA: Insufficient documentation

## 2016-06-23 DIAGNOSIS — R002 Palpitations: Secondary | ICD-10-CM | POA: Insufficient documentation

## 2016-06-23 DIAGNOSIS — R05 Cough: Secondary | ICD-10-CM | POA: Diagnosis not present

## 2016-06-23 DIAGNOSIS — R0602 Shortness of breath: Secondary | ICD-10-CM | POA: Insufficient documentation

## 2016-06-23 LAB — CBC
HCT: 42.6 % (ref 40.0–52.0)
Hemoglobin: 14.7 g/dL (ref 13.0–18.0)
MCH: 30.3 pg (ref 26.0–34.0)
MCHC: 34.4 g/dL (ref 32.0–36.0)
MCV: 88 fL (ref 80.0–100.0)
PLATELETS: 260 10*3/uL (ref 150–440)
RBC: 4.84 MIL/uL (ref 4.40–5.90)
RDW: 11.9 % (ref 11.5–14.5)
WBC: 8.7 10*3/uL (ref 3.8–10.6)

## 2016-06-23 LAB — BASIC METABOLIC PANEL
Anion gap: 7 (ref 5–15)
BUN: 12 mg/dL (ref 6–20)
CALCIUM: 9.4 mg/dL (ref 8.9–10.3)
CHLORIDE: 104 mmol/L (ref 101–111)
CO2: 27 mmol/L (ref 22–32)
CREATININE: 1.03 mg/dL (ref 0.61–1.24)
GFR calc non Af Amer: >60 mL/min (ref >60)
Glucose, Bld: 122 mg/dL — ABNORMAL HIGH (ref 65–99)
Potassium: 3.7 mmol/L (ref 3.5–5.1)
SODIUM: 138 mmol/L (ref 135–145)

## 2016-06-23 LAB — TROPONIN I

## 2016-06-23 LAB — TSH: TSH: 1.247 u[IU]/mL (ref 0.350–4.500)

## 2016-06-23 MED ORDER — ASPIRIN 81 MG PO CHEW
324.0000 mg | CHEWABLE_TABLET | Freq: Once | ORAL | Status: AC
  Administered 2016-06-23: 324 mg via ORAL

## 2016-06-23 MED ORDER — ASPIRIN 81 MG PO CHEW
CHEWABLE_TABLET | ORAL | Status: AC
  Filled 2016-06-23: qty 4

## 2016-06-23 NOTE — ED Provider Notes (Signed)
[facility] Emergency Department Provider Note  ____________________________________________  Time seen: Approximately 5:54 PM  I have reviewed the triage vital signs and the nursing notes.   HISTORY  Chief Complaint Chest Pain   HPI Carlos Brewer. is a 31 y.o. male no significant past medical history who presents for evaluation of of palpitations. Patient reports over the course of the last week he has had daily episodes where he feels his heart racing associated with left sided chest tightness/ squeezing. No dizziness, no syncopal episodes, no personal or family history of ischemic heart disease or sudden death. Patient is a smoker. He reports several episodes daily. Some episodes last 10 minutes, lasts a few hours. Today he had a more severe episode which was associated with shortness of breath. Patient is asymptomatic at this time. Patient also has had a cough productive of yellow phlegm over the course of the same week. No fever or chills, no wheezing, no nausea, no vomiting, no diarrhea.Patient reports that he drinks a lot of caffeine and multiple glasses of caffeinated soda daily.  History reviewed. No pertinent past medical history.  There are no active problems to display for this patient.   History reviewed. No pertinent surgical history.  Prior to Admission medications   Medication Sig Start Date End Date Taking? Authorizing Provider  ibuprofen (ADVIL,MOTRIN) 600 MG tablet Take 1 tablet (600 mg total) by mouth every 8 (eight) hours as needed. 06/01/16   Darel Hong, MD  naproxen (NAPROSYN) 250 MG tablet Take 1 tablet (250 mg total) by mouth 2 (two) times daily with a meal. 09/04/14   Waynetta Pean, PA-C  ondansetron (ZOFRAN ODT) 4 MG disintegrating tablet Take 1 tablet (4 mg total) by mouth every 8 (eight) hours as needed for nausea or vomiting. 09/04/14   Waynetta Pean, PA-C    Allergies Patient has no known allergies.  No family  history on file.  Social History Social History  Substance Use Topics  . Smoking status: Current Every Day Smoker    Packs/day: 0.50    Types: Cigarettes  . Smokeless tobacco: Never Used  . Alcohol use No    Review of Systems  Constitutional: Negative for fever. Eyes: Negative for visual changes. ENT: Negative for sore throat. Neck: No neck pain  Cardiovascular: + palpitations and chest tightness Respiratory: + shortness of breath. Gastrointestinal: Negative for abdominal pain, vomiting or diarrhea. Genitourinary: Negative for dysuria. Musculoskeletal: Negative for back pain. Skin: Negative for rash. Neurological: Negative for headaches, weakness or numbness. Psych: No SI or HI  ____________________________________________   PHYSICAL EXAM:  VITAL SIGNS: ED Triage Vitals  Enc Vitals Group     BP 06/23/16 1550 138/90     Pulse Rate 06/23/16 1550 (!) 102     Resp 06/23/16 1550 18     Temp 06/23/16 1550 98.6 F (37 C)     Temp Source 06/23/16 1550 Oral     SpO2 06/23/16 1550 96 %     Weight 06/23/16 1546 270 lb (122.5 kg)     Height 06/23/16 1546 5\' 11"  (1.803 m)     Head Circumference --      Peak Flow --      Pain Score 06/23/16 1546 4     Pain Loc --      Pain Edu? --      Excl. in [place]? --     Constitutional: Alert and oriented. Well appearing and in no apparent distress. HEENT:  Head: Normocephalic and atraumatic.         Eyes: Conjunctivae are normal. Sclera is non-icteric. EOMI. PERRL      Mouth/Throat: Mucous membranes are moist.       Neck: Supple with no signs of meningismus. Cardiovascular: Regular rate and rhythm. No murmurs, gallops, or rubs. 2+ symmetrical distal pulses are present in all extremities. No JVD. Respiratory: Normal respiratory effort. Lungs are clear to auscultation bilaterally. No wheezes, crackles, or rhonchi.  Gastrointestinal: Soft, non tender, and non distended with positive bowel sounds. No rebound or  guarding. Genitourinary: No CVA tenderness. Musculoskeletal: Nontender with normal range of motion in all extremities. No edema, cyanosis, or erythema of extremities. Neurologic: Normal speech and language. Face is symmetric. Moving all extremities. No gross focal neurologic deficits are appreciated. Skin: Skin is warm, dry and intact. No rash noted. Psychiatric: Mood and affect are normal. Speech and behavior are normal.  ____________________________________________   LABS (all labs ordered are listed, but only abnormal results are displayed)  Labs Reviewed  BASIC METABOLIC PANEL - Abnormal; Notable for the following:       Result Value   Glucose, Bld 122 (*)    All other components within normal limits  CBC  TROPONIN I  TROPONIN I  TSH   ____________________________________________  EKG  ED ECG REPORT I, Rudene Re, the attending physician, personally viewed and interpreted this ECG.  Normal sinus rhythm, normal axis, no STE or depressions, no evidence of HOCM, AV block, delta wave, ARVD, prolonged QTc, WPW. No changes when compared to EKG from 2016.  ____________________________________________  RADIOLOGY  CXR: negative  ____________________________________________   PROCEDURES  Procedure(s) performed: None Procedures Critical Care performed:  None ____________________________________________   INITIAL IMPRESSION / ASSESSMENT AND PLAN / ED COURSE   32 y.o. male no significant past medical history who presents for evaluation of of palpitations x 1 week. Patient is asymptomatic at this time. We'll watch patient on telemetry for any episodes. We'll check TSH, psych cardiac enzymes, check electrolytes, check blood work for anemia. If everything is negative patient be discharged with close follow-up with cardiology for a Holter monitor.     Clinical Course as of Jun 23 1949  Mon Jun 23, 2016  4827 Patient was monitored for 2.5 hours in the ED with no evidence  of arrhythmia. He remained stable with no CP, SOB or palpitations. Labs including troponin x 2 negative. Will dc with referral to Dr. Fletcher Anon for Holter monitoring. Discuss return precautions with patient for any episodes of CP, syncope, or new symptoms  [CV]    Clinical Course User Index [CV] Rudene Re, MD    Pertinent labs & imaging results that were available during my care of the patient were reviewed by me and considered in my medical decision making (see chart for details).    ____________________________________________   FINAL CLINICAL IMPRESSION(S) / ED DIAGNOSES  Final diagnoses:  Palpitations      NEW MEDICATIONS STARTED DURING THIS VISIT:  New Prescriptions   No medications on file     Note:  This document was prepared using Dragon voice recognition software and may include unintentional dictation errors.    Rudene Re, MD 06/23/16 [phone]

## 2016-06-23 NOTE — ED Notes (Signed)
States chest pain and SOB with cough, states "feeling as if his heart is racing" , states yellow productive cough, resp even and unlabored

## 2016-06-23 NOTE — ED Triage Notes (Signed)
Pt in via POV with complaints of intermittent left side chest pain x approximately one week.  Pt reports associated shortness of breath since this morning, denies any other symptoms.  Pt ambulatory to triage room without difficulty; NAD noted at this time.

## 2016-07-10 ENCOUNTER — Encounter: Payer: Self-pay | Admitting: Cardiology

## 2016-07-10 ENCOUNTER — Ambulatory Visit (INDEPENDENT_AMBULATORY_CARE_PROVIDER_SITE_OTHER): Payer: Commercial Managed Care - PPO | Admitting: Cardiology

## 2016-07-10 VITALS — BP 132/96 | HR 86 | Ht 71.0 in | Wt 288.5 lb

## 2016-07-10 DIAGNOSIS — Z6841 Body Mass Index (BMI) 40.0 and over, adult: Secondary | ICD-10-CM | POA: Diagnosis not present

## 2016-07-10 DIAGNOSIS — R002 Palpitations: Secondary | ICD-10-CM | POA: Diagnosis not present

## 2016-07-10 NOTE — Patient Instructions (Addendum)
Testing/Procedures: Decrease Caffeine and if symptoms persist the call to scheduled holter monitor  Your physician has recommended that you wear a holter monitor. Holter monitors are medical devices that record the heart's electrical activity. Doctors most often use these monitors to diagnose arrhythmias. Arrhythmias are problems with the speed or rhythm of the heartbeat. The monitor is a small, portable device. You can wear one while you do your normal daily activities. This is usually used to diagnose what is causing palpitations/syncope (passing out).    Follow-Up: Your physician recommends that you schedule a follow-up appointment as needed. Please call if you would like to schedule the holter monitor.    It was a pleasure seeing you today here in the office. Please do not hesitate to give [place] a call back if you have any further questions. Welch, BSN     Holter Monitoring A Holter monitor is a small device that is used to detect abnormal heart rhythms. It clips to your clothing and is connected by wires to flat, sticky disks (electrodes) that attach to your chest. It is worn continuously for 24-48 hours. Follow these instructions at home:  Wear your Holter monitor at all times, even while exercising and sleeping, for as long as directed by your health care provider.  Make sure that the Holter monitor is safely clipped to your clothing or close to your body as recommended by your health care provider.  Do not get the monitor or wires wet.  Do not put body lotion or moisturizer on your chest.  Keep your skin clean.  Keep a diary of your daily activities, such as walking and doing chores. If you feel that your heartbeat is abnormal or that your heart is fluttering or skipping a beat:  Record what you are doing when it happens.  Record what time of day the symptoms occur.  Return your Holter monitor as directed by your health care provider.  Keep all  follow-up visits as directed by your health care provider. This is important. Get help right away if:  You feel lightheaded or you faint.  You have trouble breathing.  You feel pain in your chest, upper arm, or jaw.  You feel sick to your stomach and your skin is pale, cool, or damp.  You heartbeat feels unusual or abnormal. This information is not intended to replace advice given to you by your health care provider. Make sure you discuss any questions you have with your health care provider. Document Released: 11/30/2003 Document Revised: 08/09/2015 Document Reviewed: 10/10/2013 Elsevier Interactive Patient Education  2017 [employer].

## 2016-07-10 NOTE — Progress Notes (Signed)
Cardiology Office Note   Date:  07/10/2016   ID:  Carlos Brewer., DOB 1985/07/29, MRN [id number]  Referring Doctor:  Lorelee Market, MD   Cardiologist:   Wende Bushy, MD   Reason for consultation:  Chief Complaint  Patient presents with  . other    ED f/u.  Pt c/o fast heart rate, states that when his emotions start to change thats when those episodes happen cocaine use but has stopped in the last 3 months.  Meds verbally reviewed with patient.       History of Present Illness: Carlos Brewer. is a 31 y.o. male who is being seen today for the evaluation of Palpitations at the request of Lorelee Market, MD.  Palpitations have been going on for 2 months now. Randomly occurring, symptoms on the chest, described as racing heartbeat, moderate intensity, last 15-20 minutes at a time. Denies chest pain and shortness of breath. No known precipitating or palliating factors. On further inquiry, patient reports that he was using cocaine, last time was in January. He did not have symptoms at that time.  Again on further inquiry, patient has been drinking probably more than 7 cans of regular soda a day. Once, he had an episode of fast heartbeat that woke him up from sleep and he had to sit up. He was given antacid by his wife/significant other, and he reported relief with that medication.  He was in the ER for 9 2018 for episode of palpitation. He was ruled out for acute coronary syndrome with 2 negative troponins, negative TSH as well.  He has not been to his PCP recently.  Otherwise, patient denies PND, orthopnea, edema. No chest pain, shortness of breath, lightheadedness.    ROS:  Please see the history of present illness. Aside from mentioned under HPI, all other systems are reviewed and negative.     History reviewed. No pertinent past medical history.  Past Surgical History:  Procedure Laterality Date  . WISDOM TOOTH EXTRACTION  2008     reports that he  has been smoking Cigarettes.  He has been smoking about 0.25 packs per day. He has never used smokeless tobacco. He reports that he does not drink alcohol or use drugs.   family history is not on file. No known CAD or SCD in the family  Outpatient Medications Prior to Visit  Medication Sig Dispense Refill  . ibuprofen (ADVIL,MOTRIN) 600 MG tablet Take 1 tablet (600 mg total) by mouth every 8 (eight) hours as needed. 30 tablet 0  . naproxen (NAPROSYN) 250 MG tablet Take 1 tablet (250 mg total) by mouth 2 (two) times daily with a meal. (Patient not taking: Reported on 07/10/2016) 30 tablet 0  . ondansetron (ZOFRAN ODT) 4 MG disintegrating tablet Take 1 tablet (4 mg total) by mouth every 8 (eight) hours as needed for nausea or vomiting. (Patient not taking: Reported on 07/10/2016) 10 tablet 0   No facility-administered medications prior to visit.      Allergies: Patient has no known allergies.    PHYSICAL EXAM: VS:  BP (!) 132/96 (BP Location: Left Arm, Patient Position: Sitting, Cuff Size: Large)   Pulse 86   Ht 5\' 11"  (1.803 m)   Wt 288 lb 8 oz (130.9 kg)   BMI 40.24 kg/m  , Body mass index is 40.24 kg/m. Wt Readings from Last 3 Encounters:  07/10/16 288 lb 8 oz (130.9 kg)  06/23/16 270 lb (122.5 kg)  06/01/16 210  lb (95.3 kg)    GENERAL:  well developed, well nourished, Morbidly obese, not in acute distress HEENT: normocephalic, pink conjunctivae, anicteric sclerae, no xanthelasma, normal dentition, oropharynx clear NECK:  no neck vein engorgement, JVP normal, no hepatojugular reflux, carotid upstroke brisk and symmetric, no bruit, no thyromegaly, no lymphadenopathy LUNGS:  good respiratory effort, clear to auscultation bilaterally CV:  PMI not displaced, no thrills, no lifts, S1 and S2 within normal limits, no palpable S3 or S4, no murmurs, no rubs, no gallops ABD:  Soft, nontender, nondistended, normoactive bowel sounds, no abdominal aortic bruit, no hepatomegaly, no  splenomegaly MS: nontender back, no kyphosis, no scoliosis, no joint deformities EXT:  2+ DP/PT pulses, no edema, no varicosities, no cyanosis, no clubbing SKIN: warm, nondiaphoretic, normal turgor, no ulcers NEUROPSYCH: alert, oriented to person, place, and time, sensory/motor grossly intact, normal mood, appropriate affect  Recent Labs: 06/23/2016: BUN 12; Creatinine, Ser 1.03; Hemoglobin 14.7; Platelets 260; Potassium 3.7; Sodium 138; TSH 1.247   Lipid Panel No results found for: CHOL, TRIG, HDL, CHOLHDL, VLDL, LDLCALC, LDLDIRECT   Other studies Reviewed:  EKG:  The ekg from 07/10/2016 was personally reviewed by me and it revealed sinus rhythm, 86 BPM.  Additional studies/ records that were reviewed personally reviewed by me today include: None available   ASSESSMENT AND PLAN: Palpitations Likely related to excessive intake of soda/caffeinated beverage. He reports that when he started decreasing the amount of soda that he drinks in a day, he noted some improvement in his palpitations. Recommend either doing a Holter now or wean off of the soda and do the Holter if his symptoms persisted. Patient would prefer that he try to wean off the soda and will call our office if he continued to have symptoms.  Morbid obesity Body mass index is 40.24 kg/m. Recommend aggressive weight loss through diet and increased physical activity.  Recommend regular follow up with PCP for screening blood work, etc. Patient verbalized understanding.   Current medicines are reviewed at length with the patient today.  The patient does not have concerns regarding medicines.  Labs/ tests ordered today include:  Orders Placed This Encounter  Procedures  . EKG 12-Lead    I had a lengthy and detailed discussion with the patient regarding diagnoses, prognosis, diagnostic options.  I counseled the patient on importance of lifestyle modification including heart healthy diet, regular physical  activity.   Disposition:   FU with Cardiology after tests   Thank you for this consultation. We will forwarding this consultation to referring physician.   I spent at least 60 minutes with the patient today and more than 50% of the time was spent counseling the patient and coordinating care.    Signed, Wende Bushy, MD  07/10/2016 11:10 AM    [place]  This note was generated in part with voice recognition software and I apologize for any typographical errors that were not detected and corrected.

## 2016-08-25 ENCOUNTER — Encounter: Payer: Self-pay | Admitting: Emergency Medicine

## 2016-08-25 ENCOUNTER — Emergency Department
Admission: EM | Admit: 2016-08-25 | Discharge: 2016-08-25 | Disposition: A | Discharge status: Home / Self Care | Payer: Commercial Managed Care - PPO | Attending: Emergency Medicine | Admitting: Emergency Medicine

## 2016-08-25 DIAGNOSIS — M5432 Sciatica, left side: Secondary | ICD-10-CM | POA: Insufficient documentation

## 2016-08-25 DIAGNOSIS — F17210 Nicotine dependence, cigarettes, uncomplicated: Secondary | ICD-10-CM | POA: Diagnosis not present

## 2016-08-25 DIAGNOSIS — M545 Low back pain: Secondary | ICD-10-CM | POA: Diagnosis present

## 2016-08-25 MED ORDER — PREDNISONE 10 MG (21) PO TBPK: ORAL_TABLET | ORAL | 0 refills | Status: DC

## 2016-08-25 MED ORDER — CYCLOBENZAPRINE HCL 10 MG PO TABS: 10.0000 mg | ORAL_TABLET | Freq: Three times a day (TID) | ORAL | 0 refills | Status: DC | PRN

## 2016-08-25 MED ORDER — HYDROCODONE-ACETAMINOPHEN 5-325 MG PO TABS: 1.0000 | ORAL_TABLET | ORAL | 0 refills | Status: DC | PRN

## 2016-08-25 NOTE — ED Provider Notes (Signed)
[facility] Emergency Department Provider Note ____________________________________________  Time seen: Approximately 4:53 PM  I have reviewed the triage vital signs and the nursing notes.   HISTORY  Chief Complaint Back Pain    HPI Carlos Alicea. is a 31 y.o. male who presents to the emergency department for evaluation of nontraumatic low back pain that started about 4 days ago. He states that he stepped off of an uneven surface and may have twisted his back, but didn't feel pain immediately afterward. Pain is in the left lower back and radiates into the left leg. He has not attempted any alleviating measures for this pain. He denies similar previous pain.  History reviewed. No pertinent past medical history.  There are no active problems to display for this patient.   Past Surgical History:  Procedure Laterality Date  . [place] EXTRACTION  2008    Prior to Admission medications   Medication Sig Start Date End Date Taking? Authorizing Provider  cyclobenzaprine (FLEXERIL) 10 MG tablet Take 1 tablet (10 mg total) by mouth 3 (three) times daily as needed for muscle spasms. 08/25/16   Lillieanna Tuohy, Johnette Abraham B, FNP  HYDROcodone-acetaminophen (NORCO/VICODIN) 5-325 MG tablet Take 1 tablet by mouth every 4 (four) hours as needed for moderate pain. 08/25/16 08/25/17  Jaliyah Fotheringham, Dessa Phi, FNP  ibuprofen (ADVIL,MOTRIN) 600 MG tablet Take 1 tablet (600 mg total) by mouth every 8 (eight) hours as needed. 06/01/16   Darel Hong, MD  predniSONE (STERAPRED UNI-PAK 21 TAB) 10 MG (21) TBPK tablet Take 6 tablets on day 1 Take 5 tablets on day 2 Take 4 tablets on day 3 Take 3 tablets on day 4 Take 2 tablets on day 5 Take 1 tablet on day 6 08/25/16   Sherrie George B, FNP    Allergies Patient has no known allergies.  No family history on file.  Social History Social History  Substance Use Topics  . Smoking status: Current Every Day Smoker    Packs/day: 0.25   Types: Cigarettes  . Smokeless tobacco: Never Used     Comment: about 5 cigarettes day.    . Alcohol use No     Comment: none    Review of Systems Constitutional: Negative for recent illness or injury Respiratory: Negative for cough or shortness of breath. Musculoskeletal: Positive for lower back pain. Skin: Negative for rash, lesion, or wound.  Neurological: Positive for radiculopathy--left lower back into posterior upper left leg  ____________________________________________   PHYSICAL EXAM:  VITAL SIGNS: ED Triage Vitals  Enc Vitals Group     BP 08/25/16 1620 127/88     Pulse Rate 08/25/16 1620 87     Resp 08/25/16 1620 20     Temp 08/25/16 1620 98.2 F (36.8 C)     Temp Source 08/25/16 1620 Oral     SpO2 08/25/16 1620 98 %     Weight 08/25/16 1620 280 lb (127 kg)     Height 08/25/16 1620 5\' 11"  (1.803 m)     Head Circumference --      Peak Flow --      Pain Score 08/25/16 1622 10     Pain Loc --      Pain Edu? --      Excl. in [place]? --     Constitutional: Alert and oriented. Well appearing and in no acute distress. Eyes: Conjunctivae are normal without erythema or drainage.  Head: Atraumatic Neck: Active, full range of motion. Respiratory: Respirations even and unlabored.  Musculoskeletal: Tender over the left lower back. Neurologic: Straight leg raise is positive on the left at about 40*.  Skin: Atraumatic  Psychiatric: Flat affect. Normal behavior.  ____________________________________________   LABS (all labs ordered are listed, but only abnormal results are displayed)  Labs Reviewed - No data to display ____________________________________________  RADIOLOGY  Not indicated. ____________________________________________   PROCEDURES  Procedure(s) performed: None  ____________________________________________   INITIAL IMPRESSION / ASSESSMENT AND PLAN / ED COURSE  Carlos Baston. is a 31 y.o. male who presents to the emergency department  for evaluation of lower back pain. Symptoms and exam are consistent with sciatica and he will be treated as such. Prednisone, flexeril, and norco prescriptions written. He is to follow up with his PCP for symptoms that are not improving over the week or return to the ER for symptoms that change or worsen if unable to schedule an appointment.   Pertinent labs & imaging results that were available during my care of the patient were reviewed by me and considered in my medical decision making (see chart for details).  _________________________________________   FINAL CLINICAL IMPRESSION(S) / ED DIAGNOSES  Final diagnoses:  Sciatica of left side    Discharge Medication List as of 08/25/2016  5:00 PM    START taking these medications   Details  cyclobenzaprine (FLEXERIL) 10 MG tablet Take 1 tablet (10 mg total) by mouth 3 (three) times daily as needed for muscle spasms., Starting Mon 08/25/2016, Print    HYDROcodone-acetaminophen (NORCO/VICODIN) 5-325 MG tablet Take 1 tablet by mouth every 4 (four) hours as needed for moderate pain., Starting Mon 08/25/2016, Until Tue 08/25/2017, Print    predniSONE (STERAPRED UNI-PAK 21 TAB) 10 MG (21) TBPK tablet Take 6 tablets on day 1 Take 5 tablets on day 2 Take 4 tablets on day 3 Take 3 tablets on day 4 Take 2 tablets on day 5 Take 1 tablet on day 6, Print        If controlled substance prescribed during this visit, 12 month history viewed on the [place] prior to issuing an initial prescription for Schedule II or III opiod.    Victorino Dike, FNP 08/25/16 1901    Orbie Pyo, MD 08/25/16 2112

## 2016-08-25 NOTE — ED Notes (Addendum)
See triage note  Developed lower back pain about 4 days ago   Unsure of injury  But may have twisted back when he stepped wrong. States pain moves into left leg Ambulates well to treatment room

## 2016-08-25 NOTE — ED Triage Notes (Signed)
Low back pain x 4 days, twisted while walking.

## 2016-10-12 ENCOUNTER — Ambulatory Visit (INDEPENDENT_AMBULATORY_CARE_PROVIDER_SITE_OTHER): Payer: Commercial Managed Care - PPO

## 2016-10-12 ENCOUNTER — Ambulatory Visit
Admission: EM | Admit: 2016-10-12 | Discharge: 2016-10-12 | Disposition: A | Discharge status: Home / Self Care | Payer: Commercial Managed Care - PPO | Attending: Registered Nurse | Admitting: Registered Nurse

## 2016-10-12 DIAGNOSIS — J301 Allergic rhinitis due to pollen: Secondary | ICD-10-CM

## 2016-10-12 DIAGNOSIS — H65112 Acute and subacute allergic otitis media (mucoid) (sanguinous) (serous), left ear: Secondary | ICD-10-CM | POA: Diagnosis not present

## 2016-10-12 DIAGNOSIS — J181 Lobar pneumonia, unspecified organism: Secondary | ICD-10-CM

## 2016-10-12 DIAGNOSIS — R05 Cough: Secondary | ICD-10-CM

## 2016-10-12 DIAGNOSIS — R0602 Shortness of breath: Secondary | ICD-10-CM | POA: Diagnosis not present

## 2016-10-12 DIAGNOSIS — J189 Pneumonia, unspecified organism: Secondary | ICD-10-CM

## 2016-10-12 MED ORDER — SALINE SPRAY 0.65 % NA SOLN: 2.0000 | NASAL | 0 refills | Status: DC

## 2016-10-12 MED ORDER — IPRATROPIUM-ALBUTEROL 0.5-2.5 (3) MG/3ML IN SOLN
3.0000 mL | Freq: Once | RESPIRATORY_TRACT | Status: AC
  Administered 2016-10-12: 3 mL via RESPIRATORY_TRACT

## 2016-10-12 MED ORDER — FLUTICASONE PROPIONATE 50 MCG/ACT NA SUSP: 1.0000 | Freq: Two times a day (BID) | NASAL | 0 refills | Status: DC

## 2016-10-12 MED ORDER — DOXYCYCLINE HYCLATE 100 MG PO CAPS: 100.0000 mg | ORAL_CAPSULE | Freq: Two times a day (BID) | ORAL | 0 refills | Status: DC

## 2016-10-12 MED ORDER — LORATADINE 10 MG PO TABS: 10.0000 mg | ORAL_TABLET | Freq: Every day | ORAL | 1 refills | Status: DC

## 2016-10-12 MED ORDER — AMOXICILLIN-POT CLAVULANATE 875-125 MG PO TABS: 1.0000 | ORAL_TABLET | Freq: Two times a day (BID) | ORAL | 0 refills | Status: DC

## 2016-10-12 MED ORDER — BENZONATATE 100 MG PO CAPS: 100.0000 mg | ORAL_CAPSULE | Freq: Three times a day (TID) | ORAL | 0 refills | Status: DC

## 2016-10-12 MED ORDER — ACETAMINOPHEN 500 MG PO TABS: 1000.0000 mg | ORAL_TABLET | Freq: Four times a day (QID) | ORAL | 0 refills | Status: DC | PRN

## 2016-10-12 NOTE — ED Provider Notes (Signed)
CSN: [id number]     Arrival date & time 10/12/16  1015 History   First MD Initiated Contact with Patient 10/12/16 1034     Chief Complaint  Patient presents with  . Cough  . Nasal Congestion  . Shortness of Breath   (Consider location/radiation/quality/duration/timing/severity/associated sxs/prior Treatment) 31y/o married african [place] male new patient here for evaluation SOB that started today along with  Productive cough x 1 week a lot of phelgm.  PMHx obesity, palpitations, anxiety, allergies pollen.  Has tried claritin and mucinex in the past for allergies none in the past month.  Denied others sick at home or work.   Has felt hot the past week.  Relationship stressors.  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  Was seen at ER a month ago for back strain.  Hx cocaine use denied use x 4 months.      History reviewed. No pertinent past medical history. Past Surgical History:  Procedure Laterality Date  . [place] EXTRACTION  2008   History reviewed. No pertinent family history. Social History  Substance Use Topics  . Smoking status: Current Every Day Smoker    Packs/day: 0.25    Types: Cigarettes  . Smokeless tobacco: Never Used     Comment: about 5 cigarettes day.    . Alcohol use No     Comment: none    Review of Systems  Constitutional: Positive for fever. Negative for activity change, appetite change, chills, diaphoresis, fatigue and unexpected weight change.  HENT: Positive for congestion, postnasal drip and rhinorrhea. Negative for dental problem, drooling, ear discharge, ear pain, facial swelling, hearing loss, mouth sores, nosebleeds, sinus pain, sinus pressure, sneezing, sore throat, tinnitus, trouble swallowing and voice change.   Eyes: Negative for photophobia, pain, discharge, redness, itching and visual disturbance.  Respiratory: Positive for cough, shortness of breath and wheezing. Negative for choking, chest tightness and stridor.   Cardiovascular: Negative for  chest pain, palpitations and leg swelling.  Gastrointestinal: Negative for abdominal distention, abdominal pain, blood in stool, constipation, diarrhea, nausea and vomiting.  Endocrine: Negative for cold intolerance and heat intolerance.  Genitourinary: Negative for dysuria.  Musculoskeletal: Negative for arthralgias, back pain, gait problem, joint swelling, myalgias, neck pain and neck stiffness.  Skin: Negative for color change, pallor, rash and wound.  Allergic/Immunologic: Positive for environmental allergies. Negative for food allergies and immunocompromised state.  Neurological: Negative for dizziness, tremors, seizures, syncope, facial asymmetry, speech difficulty, weakness, light-headedness, numbness and headaches.  Hematological: Negative for adenopathy. Does not bruise/bleed easily.  Psychiatric/Behavioral: Negative for agitation, behavioral problems, confusion and sleep disturbance.    Allergies  Patient has no known allergies.  Home Medications   Prior to Admission medications   Medication Sig Start Date End Date Taking? Authorizing Provider  acetaminophen (TYLENOL) 500 MG tablet Take 2 tablets (1,000 mg total) by mouth every 6 (six) hours as needed. 10/12/16   Betancourt, Aura Fey, NP  amoxicillin-clavulanate (AUGMENTIN) 875-125 MG tablet Take 1 tablet by mouth every 12 (twelve) hours. 10/12/16   Betancourt, Aura Fey, NP  benzonatate (TESSALON) 100 MG capsule Take 1 capsule (100 mg total) by mouth every 8 (eight) hours. 10/12/16   Betancourt, Aura Fey, NP  doxycycline (VIBRAMYCIN) 100 MG capsule Take 1 capsule (100 mg total) by mouth 2 (two) times daily. 10/12/16   Betancourt, Aura Fey, NP  fluticasone (FLONASE) 50 MCG/ACT nasal spray Place 1 spray into both nostrils 2 (two) times daily. 10/12/16   Betancourt, Aura Fey, NP  loratadine (CLARITIN) 10  MG tablet Take 1 tablet (10 mg total) by mouth daily. 10/12/16   Betancourt, Aura Fey, NP  sodium chloride (OCEAN) 0.65 % SOLN nasal spray Place 2  sprays into both nostrils every 2 (two) hours while awake. 10/12/16 11/11/16  BetancourtAura Fey, NP   Meds Ordered and Administered this Visit   Medications  ipratropium-albuterol (DUONEB) 0.5-2.5 (3) MG/3ML nebulizer solution 3 mL (3 mLs Nebulization Given 10/12/16 1058)    BP (!) 141/115 (BP Location: Left Arm)   Pulse 99   Temp 97.9 F (36.6 C) (Oral)   Resp 20   Ht 6' (1.829 m)   Wt 280 lb (127 kg)   SpO2 98%   BMI 37.97 kg/m  No data found.   Physical Exam  Constitutional: He is oriented to person, place, and time. Vital signs are normal. He appears well-developed and well-nourished. He is active and cooperative.  Non-toxic appearance. He does not have a sickly appearance. He appears ill. No distress.  HENT:  Head: Normocephalic and atraumatic.  Right Ear: Hearing, external ear and ear canal normal. A middle ear effusion is present.  Left Ear: Hearing, external ear and ear canal normal. Tympanic membrane is erythematous and bulging. A middle ear effusion is present.  Nose: Mucosal edema and rhinorrhea present. No nose lacerations, sinus tenderness, nasal deformity, septal deviation or nasal septal hematoma. No epistaxis.  No foreign bodies. Right sinus exhibits no maxillary sinus tenderness and no frontal sinus tenderness. Left sinus exhibits no maxillary sinus tenderness and no frontal sinus tenderness.  Mouth/Throat: Uvula is midline and mucous membranes are normal. Mucous membranes are not pale, not dry and not cyanotic. He does not have dentures. No oral lesions. No trismus in the jaw. Normal dentition. No dental abscesses, uvula swelling, lacerations or dental caries. Posterior oropharyngeal edema and posterior oropharyngeal erythema present. No oropharyngeal exudate or tonsillar abscesses. Tonsils are 2+ on the right. Tonsils are 2+ on the left.  Bilateral allergic shiners; lower eyelid swelling nonpitting 1-2+/4 bilaterally; cobblestoning posterior pharynx; bilateral TMs with  air fluid level left with erythema centrally; tonsils 2+/4 edema erythema;   Eyes: Pupils are equal, round, and reactive to light. Conjunctivae, EOM and lids are normal. Right eye exhibits no chemosis, no discharge, no exudate and no hordeolum. No foreign body present in the right eye. Left eye exhibits no chemosis, no discharge, no exudate and no hordeolum. No foreign body present in the left eye. Right conjunctiva is not injected. Right conjunctiva has no hemorrhage. Left conjunctiva is not injected. Left conjunctiva has no hemorrhage. No scleral icterus. Right eye exhibits normal extraocular motion and no nystagmus. Left eye exhibits normal extraocular motion and no nystagmus. Right pupil is round and reactive. Left pupil is round and reactive. Pupils are equal.  Neck: Trachea normal and normal range of motion. Neck supple. No tracheal tenderness, no spinous process tenderness and no muscular tenderness present. No neck rigidity. No tracheal deviation, no edema, no erythema and normal range of motion present. No thyroid mass and no thyromegaly present.  Cardiovascular: Normal rate, regular rhythm, S1 normal, S2 normal, normal heart sounds and intact distal pulses.  PMI is not displaced.  Exam reveals no gallop and no friction rub.   No murmur heard. Pulses:      Radial pulses are 2+ on the right side, and 2+ on the left side.  Pulmonary/Chest: Effort normal. No stridor. No respiratory distress. He has no decreased breath sounds. He has no wheezes. He has no rhonchi.  He has rales in the left middle field and the left lower field.  Frequent nonproductive cough noted in exam room; rales that cleared with coughing LMF and LLF; negative egophany all fields  Abdominal: Soft. Normal appearance. He exhibits no distension, no fluid wave and no ascites. There is no rigidity and no guarding. Hernia confirmed negative in the ventral area.  Musculoskeletal: Normal range of motion. He exhibits no edema or tenderness.        Right shoulder: Normal.       Left shoulder: Normal.       Right elbow: Normal.      Left elbow: Normal.       Right hip: Normal.       Left hip: Normal.       Right knee: Normal.       Left knee: Normal.       Cervical back: Normal.       Thoracic back: He exhibits pain. He exhibits normal range of motion, no tenderness, no bony tenderness, no swelling, no edema, no deformity, no laceration, no spasm and normal pulse.       Lumbar back: Normal.       Back:       Right hand: Normal.       Left hand: Normal.  Pain with cough mid thoracic left  Lymphadenopathy:       Head (right side): No submental, no submandibular, no tonsillar, no preauricular, no posterior auricular and no occipital adenopathy present.       Head (left side): No submental, no submandibular, no tonsillar, no preauricular, no posterior auricular and no occipital adenopathy present.    He has no cervical adenopathy.       Right cervical: No superficial cervical, no deep cervical and no posterior cervical adenopathy present.      Left cervical: No superficial cervical, no deep cervical and no posterior cervical adenopathy present.  Neurological: He is alert and oriented to person, place, and time. He has normal strength. He displays no atrophy and no tremor. No cranial nerve deficit or sensory deficit. He exhibits normal muscle tone. He displays no seizure activity. Coordination and gait normal. GCS eye subscore is 4. GCS verbal subscore is 5. GCS motor subscore is 6.  In/out of chair without difficulty; gait sure and smooth steady in hall  Skin: Skin is warm, dry and intact. Capillary refill takes less than 2 seconds. No abrasion, no bruising, no burn, no ecchymosis, no laceration, no lesion, no petechiae and no rash noted. He is not diaphoretic. No cyanosis or erythema. No pallor. Nails show no clubbing.  Psychiatric: He has a normal mood and affect. His speech is normal and behavior is normal. Judgment and thought  content normal. He is not actively hallucinating. Cognition and memory are normal. He is attentive.  Nursing note and vitals reviewed.   Urgent Care Course     Procedures (including critical care time)  Labs Review Labs Reviewed - No data to display  Imaging Review Dg Chest 2 View  Result Date: 10/12/2016 CLINICAL DATA:  Shortness of breath, cough EXAM: CHEST  2 VIEW COMPARISON:  06/23/2016 FINDINGS: Patchy bilateral airspace opacities in the lower lobes. Heart is mildly enlarged. Mild peribronchial thickening. No effusions or acute bony abnormality. IMPRESSION: Cardiomegaly. Bronchitic changes. Patchy bilateral lower lobe airspace opacities concerning for pneumonia. Electronically Signed   By: Rolm Baptise M.D.   On: 10/12/2016 11:05    CXR completed ambulatory to radiology without difficulty  Duoneb completed at 1058 by RN Norm Parcel.  1125 re-evaluated patient improved airflow BLL rare rales with inspiration.  Cough decreased after duoneb.  Patient reported he is feeling better pain and SOB decreased.  Discussed CXR results pneumonia and bronchitis and given copy of report.  Discussed treatment with patient agreed with plan of care except refused albuterol MDI Rx.  Will use OTC cough suppressant prn.  Work excuse for 48 hours. Spouse and children minors in room with patient now.  Spouse and  Patient verbalized understanding information/instructions, agreed with plan of care and had no further questions at this time.   MDM   1. Community acquired pneumonia of left lower lobe of lung ([place])   2. Acute allergic otitis media of left ear, recurrence not specified   3. Seasonal allergic rhinitis due to pollen    augmentin 875mg  po BID x 10 days #20 RF0 Treatment as ordered.  Symptomatic therapy suggested fluids, NSAIDs and rest.  May take Tylenol or Motrin for fevers.  Call or return to clinic as needed if these symptoms worsen or fail to improve as anticipated. Exitcare handout on  otitis media given to patient.  Patient verbalized agreement and understanding of treatment plan.   P2:  Hand washing    Patient may use normal saline nasal spray as needed.  Restart flonase 1 spray each nostril BID, saline 2 sprays each nostril q2h prn congestion and restart claritin 10mg  po daily #30 Rf0.  Avoid triggers if possible.  Shower prior to bedtime if exposed to triggers.  If allergic dust/dust mites recommend mattress/pillow covers/encasements; washing linens, vacuuming, sweeping, dusting weekly.  Call or return to clinic as needed if these symptoms worsen or fail to improve as anticipated.   Exitcare handout on sinus rinse, allergic rhinitis given to patient.  Patient verbalized understanding of instructions, agreed with plan of care and had no further questions at this time.  P2:  Avoidance and hand washing.  Refused steroids, albuterol inhaler.  Bronchitis simple, community acquired, may have started as viral (probably respiratory syncytial, parainfluenza, influenza, or adenovirus), but now evidence of acute purulent bronchitis with resultant bronchial edema and mucus formation.  Viruses are the most common cause of bronchial inflammation in otherwise healthy adults with acute bronchitis.  The appearance of sputum is not predictive of whether a bacterial infection is present.  Purulent sputum is most often caused by viral infections.  There are a small portion of those caused by non-viral agents being Mycoplamsa pneumonia.  Microscopic examination or C&S of sputum in the healthy adult with acute bronchitis is generally not helpful (usually negative or normal respiratory flora) other considerations being cough from upper respiratory tract infections, sinusitis or allergic syndromes (mild asthma or viral pneumonia).  Differential Diagnosis:  reactive airway disease (asthma, allergic aspergillosis (eosinophilia), chronic bronchitis, respiratory infection (Sinusitis, Common cold, pneumonia),  congestive heart failure, reflux esophagitis, bronchogenic tumor, aspiration syndromes and/or exposure irritants/tobacco smoke.  In this case, there is no evidence of any invasive bacterial illness.  Most likely viral etiology so will hold on antibiotic treatment.  Advise supportive care with rest, encourage fluids, good hygiene and watch for any worsening symptoms.  If they were to develop:  come back to the office or go to the emergency room if after hours. Without high fever, severe dyspnea, lack of physical findings or other risk factors, I will hold on  CBC at this time. I discussed that approximately 50% of patients with acute bronchitis have a cough  that lasts up to three weeks, and 25% for over a month.  Tylenol, one to two tablets every four hours as needed for fever or myalgias.   No aspirin.  Patient instructed to follow up in one week or sooner if symptoms worsen. Patient verbalized agreement and understanding of treatment plan.  P2:  hand washing and cover cough  Pneumonia community acquired simple, community acquired, may have started as viral (probably respiratory syncytial, parainfluenza, influenza, or adenovirus), but now evidence of acute periobronchial pneumonia BLL on chest xray.  Doxycycline 100mg  po BID x 10 days #20 RF0 and augmentin 875mg  po BID x 10 days #20 RF0 per up to date guidelines.  Differential Diagnoses:  Reactive Airway Disease (asthma, allergic aspergillosis eosinophilia), chronic bronchitis, respiratory infection (sinusitis, common cold, pneumonia), congestive heart failure, smoke/irritant exposure, reflux esophagitis, bronchogenic tumor, and/or aspiration syndromes.  I will give   I discussed that approximately 50% of patients with acute bronchitis have a cough that lasts up to three weeks, and 25% for over a month. Tylenol, 500mg  one to two tablets every four hours as needed for fever or myalgias. Patient instructed to follow up with PCM or sooner if symptoms worsen.  exitcare handout on community acquired pneumonia given to patient along with 48 hours work excuse.  Patient verbalized agreement and understanding of treatment plan.  P2:  hand washing and cover cough     Olen Cordial, NP 10/12/16 1132

## 2016-10-12 NOTE — ED Triage Notes (Signed)
Pt with several days of cough and nasal congestion. Productive of yellow green mucous. Today feels like he can't take a deep breath and feels like there's a knot in his back. Reports subjective fever 3 days ago but none since. Denies pain.

## 2016-10-20 ENCOUNTER — Ambulatory Visit (INDEPENDENT_AMBULATORY_CARE_PROVIDER_SITE_OTHER): Payer: Commercial Managed Care - PPO

## 2016-10-20 ENCOUNTER — Ambulatory Visit
Admission: EM | Admit: 2016-10-20 | Discharge: 2016-10-20 | Disposition: A | Discharge status: Home / Self Care | Payer: Commercial Managed Care - PPO | Attending: Family Medicine | Admitting: Family Medicine

## 2016-10-20 DIAGNOSIS — S51822A Laceration with foreign body of left forearm, initial encounter: Secondary | ICD-10-CM | POA: Diagnosis not present

## 2016-10-20 DIAGNOSIS — S51819A Laceration without foreign body of unspecified forearm, initial encounter: Secondary | ICD-10-CM | POA: Diagnosis not present

## 2016-10-20 DIAGNOSIS — W268XXA Contact with other sharp object(s), not elsewhere classified, initial encounter: Secondary | ICD-10-CM | POA: Diagnosis not present

## 2016-10-20 MED ORDER — MUPIROCIN 2 % EX OINT: 1.0000 "application " | TOPICAL_OINTMENT | Freq: Two times a day (BID) | CUTANEOUS | 0 refills | Status: DC

## 2016-10-20 NOTE — ED Triage Notes (Signed)
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 He said his last tdap was 2016 and his laceration is on his left arm. This happened today he did have a 4x4 on it and tape on it.

## 2016-10-20 NOTE — ED Provider Notes (Signed)
[facility]    CSN: [id number] Arrival date & time: 10/20/16  1908     History   Chief Complaint Chief Complaint  Patient presents with  . Extremity Laceration    HPI Carlos Brewer. is a 31 y.o. male.   Patient is a 31 year old black male with a laceration of the left forearm. This happened just before he arrived. He states he was looking at some trailers when he bumped against a uneven smooth surface and lacerated his left forearm. Last tetanus he states was in 2016 so should be up-to-date no known drug allergies only surgery is with him to physical extraction. He's on no chronic medications no chronic medical problems he does smoke. No pertinent family medical history relevant to today's visit. He is very nervous about receiving any injections when mentioned that we probably will need to trim away some of the mucosa tissue from the wound he was somewhat taken back. Interval he has tattoos he states does not like needles.   The history is provided by the patient. No language interpreter was used.    History reviewed. No pertinent past medical history.  There are no active problems to display for this patient.   Past Surgical History:  Procedure Laterality Date  . [place] EXTRACTION  2008       Home Medications    Prior to Admission medications   Medication Sig Start Date End Date Taking? Authorizing Provider  acetaminophen (TYLENOL) 500 MG tablet Take 2 tablets (1,000 mg total) by mouth every 6 (six) hours as needed. 10/12/16  Yes Betancourt, Aura Fey, NP  amoxicillin-clavulanate (AUGMENTIN) 875-125 MG tablet Take 1 tablet by mouth every 12 (twelve) hours. 10/12/16  Yes Betancourt, Aura Fey, NP  benzonatate (TESSALON) 100 MG capsule Take 1 capsule (100 mg total) by mouth every 8 (eight) hours. 10/12/16  Yes Betancourt, Aura Fey, NP  doxycycline (VIBRAMYCIN) 100 MG capsule Take 1 capsule (100 mg total) by mouth 2 (two) times daily. 10/12/16  Yes Betancourt,  Aura Fey, NP  fluticasone (FLONASE) 50 MCG/ACT nasal spray Place 1 spray into both nostrils 2 (two) times daily. 10/12/16  Yes Betancourt, Aura Fey, NP  loratadine (CLARITIN) 10 MG tablet Take 1 tablet (10 mg total) by mouth daily. 10/12/16   Betancourt, Aura Fey, NP  mupirocin ointment (BACTROBAN) 2 % Apply 1 application topically 2 (two) times daily. 10/20/16   Frederich Cha, MD  sodium chloride (OCEAN) 0.65 % SOLN nasal spray Place 2 sprays into both nostrils every 2 (two) hours while awake. 10/12/16 11/11/16  BetancourtAura Fey, NP    Family History No family history on file.  Social History Social History  Substance Use Topics  . Smoking status: Current Every Day Smoker    Packs/day: 0.25    Types: Cigarettes  . Smokeless tobacco: Never Used     Comment: about 5 cigarettes day.    . Alcohol use No     Comment: none     Allergies   Patient has no known allergies.   Review of Systems Review of Systems  Skin: Positive for wound.  All other systems reviewed and are negative.    Physical Exam Triage Vital Signs ED Triage Vitals  Enc Vitals Group     BP 10/20/16 1921 138/86     Pulse Rate 10/20/16 1921 97     Resp 10/20/16 1921 18     Temp 10/20/16 1921 98.2 F (36.8 C)     Temp Source  10/20/16 1921 Oral     SpO2 10/20/16 1921 99 %     Weight --      Height --      Head Circumference --      Peak Flow --      Pain Score 10/20/16 1924 1     Pain Loc --      Pain Edu? --      Excl. in [place]? --    No data found.   Updated Vital Signs BP 138/86 (BP Location: Right Arm)   Pulse 97   Temp 98.2 F (36.8 C) (Oral)   Resp 18   SpO2 99%   Visual Acuity Right Eye Distance:   Left Eye Distance:   Bilateral Distance:    Right Eye Near:   Left Eye Near:    Bilateral Near:     Physical Exam  Constitutional: He is oriented to person, place, and time. He appears well-developed and well-nourished. No distress.  HENT:  Head: Normocephalic and atraumatic.  Right Ear:  External ear normal.  Left Ear: External ear normal.  Mouth/Throat: Oropharynx is clear and moist.  Eyes: Pupils are equal, round, and reactive to light. Conjunctivae are normal.  Neck: Normal range of motion. Neck supple.  Pulmonary/Chest: Effort normal.  Musculoskeletal: Normal range of motion.  Neurological: He is alert and oriented to person, place, and time.  Skin: Skin is warm. Laceration noted. He is not diaphoretic.     Wound on the left forearm with a silver metal that was found as the wound was repaired.  Psychiatric: He has a normal mood and affect.  Vitals reviewed.    UC Treatments / Results  Labs (all labs ordered are listed, but only abnormal results are displayed) Labs Reviewed - No data to display  EKG  EKG Interpretation None       Radiology Dg Forearm Left  Result Date: 10/20/2016 CLINICAL DATA:  Laceration with removal of metallic soft tissue foreign body. EXAM: LEFT FOREARM - 2 VIEW COMPARISON:  None. FINDINGS: Two view exam of the left forearm shows skin staples over the posterolateral aspect of the proximal soft tissues. Gas in the soft tissues of this region is compatible with the reported history of laceration. There is no associated radiopaque soft tissue foreign body. IMPRESSION: No unexpected radiopaque soft tissue foreign body in the forearm. Electronically Signed   By: Misty Stanley M.D.   On: 10/20/2016 20:53    Procedures .Marland KitchenLaceration Repair Date/Time: 10/20/2016 8:47 PM Performed by: Frederich Cha Authorized by: Frederich Cha   Consent:    Consent obtained:  Verbal   Consent given by:  Patient   Risks discussed:  Infection and retained foreign body Anesthesia (see MAR for exact dosages):    Anesthesia method:  Local infiltration   Local anesthetic:  Lidocaine 1% WITH epi Laceration details:    Location:  Shoulder/arm   Shoulder/arm location:  L lower arm   Length (cm):  5 Repair type:    Repair type:  Intermediate Pre-procedure  details:    Preparation:  Patient was prepped and draped in usual sterile fashion Exploration:    Wound exploration: wound explored through full range of motion     Wound extent: fascia violated and foreign bodies/material     Wound extent: no nerve damage noted, no tendon damage noted, no underlying fracture noted and no vascular damage noted     Contaminated: yes   Treatment:    Area cleansed with:  Betadine and  soap and water   Amount of cleaning:  Standard   Visualized foreign bodies/material removed: yes   Skin repair:    Repair method:  Staples   Number of staples:  5 Approximation:    Approximation:  Close   Vermilion border: well-aligned   Post-procedure details:    Dressing:  Antibiotic ointment   Patient tolerance of procedure:  Tolerated well, no immediate complications Comments:     Patient sent after the sliver metal was found for x-rays of the wound to make sure no further slivers  were presernt.    (including critical care time)  Medications Ordered in UC Medications - No data to display   Initial Impression / Assessment and Plan / UC Course  I have reviewed the triage vital signs and the nursing notes.  Pertinent labs & imaging results that were available during my care of the patient were reviewed by me and considered in my medical decision making (see chart for details).   patient placed on Bactroban ointment twice a day for wound infection return in about 10 days for staple removal. Patient tolerated suturing well should also be noted offered or question whether this needs draw Worker's Comp. and he declined states he is handling it.    Final Clinical Impressions(s) / UC Diagnoses   Final diagnoses:  Forearm laceration with complication, initial encounter    New Prescriptions New Prescriptions   MUPIROCIN OINTMENT (BACTROBAN) 2 %    Apply 1 application topically 2 (two) times daily.     Controlled Substance Prescriptions [place] Controlled Substance  Registry consulted? Not Applicable   Frederich Cha, MD 10/20/16 2123

## 2016-10-24 ENCOUNTER — Emergency Department: Payer: Commercial Managed Care - PPO

## 2016-10-24 ENCOUNTER — Inpatient Hospital Stay
Admission: EM | Admit: 2016-10-24 | Discharge: 2016-10-30 | DRG: 280 | Disposition: A | Discharge status: Home / Self Care | Payer: Commercial Managed Care - PPO | Attending: Internal Medicine | Admitting: Internal Medicine

## 2016-10-24 ENCOUNTER — Inpatient Hospital Stay (HOSPITAL_COMMUNITY)
Admit: 2016-10-24 | Discharge: 2016-10-24 | Disposition: A | Discharge status: Home / Self Care | Payer: Commercial Managed Care - PPO | Attending: Internal Medicine | Admitting: Internal Medicine

## 2016-10-24 DIAGNOSIS — F17210 Nicotine dependence, cigarettes, uncomplicated: Secondary | ICD-10-CM | POA: Diagnosis present

## 2016-10-24 DIAGNOSIS — I272 Pulmonary hypertension, unspecified: Secondary | ICD-10-CM | POA: Diagnosis present

## 2016-10-24 DIAGNOSIS — Z79899 Other long term (current) drug therapy: Secondary | ICD-10-CM | POA: Diagnosis not present

## 2016-10-24 DIAGNOSIS — I214 Non-ST elevation (NSTEMI) myocardial infarction: Principal | ICD-10-CM | POA: Diagnosis present

## 2016-10-24 DIAGNOSIS — F4310 Post-traumatic stress disorder, unspecified: Secondary | ICD-10-CM | POA: Diagnosis present

## 2016-10-24 DIAGNOSIS — Z6838 Body mass index (BMI) 38.0-38.9, adult: Secondary | ICD-10-CM | POA: Diagnosis not present

## 2016-10-24 DIAGNOSIS — E785 Hyperlipidemia, unspecified: Secondary | ICD-10-CM | POA: Diagnosis present

## 2016-10-24 DIAGNOSIS — F1410 Cocaine abuse, uncomplicated: Secondary | ICD-10-CM | POA: Diagnosis present

## 2016-10-24 DIAGNOSIS — I5021 Acute systolic (congestive) heart failure: Secondary | ICD-10-CM | POA: Diagnosis not present

## 2016-10-24 DIAGNOSIS — I509 Heart failure, unspecified: Secondary | ICD-10-CM

## 2016-10-24 DIAGNOSIS — F1490 Cocaine use, unspecified, uncomplicated: Secondary | ICD-10-CM | POA: Diagnosis present

## 2016-10-24 DIAGNOSIS — I110 Hypertensive heart disease with heart failure: Secondary | ICD-10-CM | POA: Diagnosis present

## 2016-10-24 DIAGNOSIS — R7989 Other specified abnormal findings of blood chemistry: Secondary | ICD-10-CM

## 2016-10-24 DIAGNOSIS — J189 Pneumonia, unspecified organism: Secondary | ICD-10-CM | POA: Diagnosis present

## 2016-10-24 DIAGNOSIS — I400 Infective myocarditis: Secondary | ICD-10-CM | POA: Diagnosis present

## 2016-10-24 DIAGNOSIS — I428 Other cardiomyopathies: Secondary | ICD-10-CM | POA: Diagnosis not present

## 2016-10-24 DIAGNOSIS — R0603 Acute respiratory distress: Secondary | ICD-10-CM | POA: Diagnosis not present

## 2016-10-24 DIAGNOSIS — R739 Hyperglycemia, unspecified: Secondary | ICD-10-CM | POA: Diagnosis present

## 2016-10-24 DIAGNOSIS — J449 Chronic obstructive pulmonary disease, unspecified: Secondary | ICD-10-CM | POA: Diagnosis present

## 2016-10-24 HISTORY — DX: Tobacco use: Z72.0

## 2016-10-24 HISTORY — DX: Palpitations: R00.2

## 2016-10-24 HISTORY — DX: Morbid (severe) obesity due to excess calories: E66.01

## 2016-10-24 LAB — URINE DRUG SCREEN, QUALITATIVE (ARMC ONLY)
Amphetamines, Ur Screen: NOT DETECTED
BENZODIAZEPINE, UR SCRN: NOT DETECTED
Barbiturates, Ur Screen: NOT DETECTED
Cannabinoid 50 Ng, Ur ~~LOC~~: NOT DETECTED
Cocaine Metabolite,Ur ~~LOC~~: POSITIVE — AB
MDMA (Ecstasy)Ur Screen: NOT DETECTED
METHADONE SCREEN, URINE: NOT DETECTED
OPIATE, UR SCREEN: NOT DETECTED
PHENCYCLIDINE (PCP) UR S: NOT DETECTED
Tricyclic, Ur Screen: NOT DETECTED

## 2016-10-24 LAB — BASIC METABOLIC PANEL
ANION GAP: 10 (ref 5–15)
BUN: 10 mg/dL (ref 6–20)
CALCIUM: 9.5 mg/dL (ref 8.9–10.3)
CO2: 27 mmol/L (ref 22–32)
Chloride: 101 mmol/L (ref 101–111)
Creatinine, Ser: 1.14 mg/dL (ref 0.61–1.24)
Glucose, Bld: 125 mg/dL — ABNORMAL HIGH (ref 65–99)
POTASSIUM: 3.7 mmol/L (ref 3.5–5.1)
SODIUM: 138 mmol/L (ref 135–145)

## 2016-10-24 LAB — BRAIN NATRIURETIC PEPTIDE: B Natriuretic Peptide: 676 pg/mL — ABNORMAL HIGH (ref 0.0–100.0)

## 2016-10-24 LAB — TROPONIN I
TROPONIN I: 3.81 ng/mL — AB (ref <0.03)
TROPONIN I: 5.38 ng/mL — AB (ref <0.03)
TROPONIN I: 4.17 ng/mL — AB (ref <0.03)

## 2016-10-24 LAB — PROCALCITONIN: Procalcitonin: <0.1 ng/mL

## 2016-10-24 LAB — PROTIME-INR
INR: 0.95
PROTHROMBIN TIME: 12.7 s (ref 11.4–15.2)

## 2016-10-24 LAB — CBC
HEMATOCRIT: 42.7 % (ref 40.0–52.0)
HEMOGLOBIN: 14.6 g/dL (ref 13.0–18.0)
MCH: 30.3 pg (ref 26.0–34.0)
MCHC: 34.2 g/dL (ref 32.0–36.0)
MCV: 88.6 fL (ref 80.0–100.0)
Platelets: 306 10*3/uL (ref 150–440)
RBC: 4.82 MIL/uL (ref 4.40–5.90)
RDW: 12.7 % (ref 11.5–14.5)
WBC: 13.3 10*3/uL — AB (ref 3.8–10.6)

## 2016-10-24 LAB — HEPARIN LEVEL (UNFRACTIONATED): Heparin Unfractionated: 0.42 IU/mL (ref 0.30–0.70)

## 2016-10-24 LAB — ECHOCARDIOGRAM COMPLETE
Height: 72 in
Weight: 4480 oz

## 2016-10-24 LAB — LACTIC ACID, PLASMA
LACTIC ACID, VENOUS: 1.7 mmol/L (ref 0.5–1.9)
LACTIC ACID, VENOUS: 1.8 mmol/L (ref 0.5–1.9)

## 2016-10-24 LAB — APTT: APTT: 27 s (ref 24–36)

## 2016-10-24 MED ORDER — DEXTROSE 5 % IV SOLN
1.0000 g | Freq: Once | INTRAVENOUS | Status: AC
  Administered 2016-10-24: 1 g via INTRAVENOUS
  Filled 2016-10-24: qty 10

## 2016-10-24 MED ORDER — DEXTROSE 5 % IV SOLN
1.0000 g | INTRAVENOUS | Status: DC
  Administered 2016-10-25: 1 g via INTRAVENOUS
  Filled 2016-10-24 (×2): qty 10

## 2016-10-24 MED ORDER — CARVEDILOL 3.125 MG PO TABS
3.1250 mg | ORAL_TABLET | Freq: Two times a day (BID) | ORAL | Status: DC
  Administered 2016-10-24 – 2016-10-30 (×11): 3.125 mg via ORAL
  Filled 2016-10-24 (×13): qty 1

## 2016-10-24 MED ORDER — BENZONATATE 100 MG PO CAPS: 100.0000 mg | ORAL_CAPSULE | Freq: Three times a day (TID) | ORAL | Status: DC | PRN

## 2016-10-24 MED ORDER — ATORVASTATIN CALCIUM 20 MG PO TABS
80.0000 mg | ORAL_TABLET | Freq: Every day | ORAL | Status: DC
  Administered 2016-10-24 – 2016-10-26 (×3): 80 mg via ORAL
  Filled 2016-10-24 (×3): qty 4

## 2016-10-24 MED ORDER — ASPIRIN EC 81 MG PO TBEC
81.0000 mg | DELAYED_RELEASE_TABLET | Freq: Every day | ORAL | Status: DC
  Administered 2016-10-24 – 2016-10-30 (×7): 81 mg via ORAL
  Filled 2016-10-24 (×7): qty 1

## 2016-10-24 MED ORDER — BUDESONIDE 0.5 MG/2ML IN SUSP
0.5000 mg | Freq: Two times a day (BID) | RESPIRATORY_TRACT | Status: DC
  Administered 2016-10-25 – 2016-10-28 (×8): 0.5 mg via RESPIRATORY_TRACT
  Filled 2016-10-24 (×10): qty 2

## 2016-10-24 MED ORDER — IPRATROPIUM-ALBUTEROL 0.5-2.5 (3) MG/3ML IN SOLN
3.0000 mL | Freq: Four times a day (QID) | RESPIRATORY_TRACT | Status: DC
  Administered 2016-10-24 – 2016-10-25 (×5): 3 mL via RESPIRATORY_TRACT
  Filled 2016-10-24 (×5): qty 3

## 2016-10-24 MED ORDER — FUROSEMIDE 10 MG/ML IJ SOLN
40.0000 mg | Freq: Once | INTRAMUSCULAR | Status: AC
  Administered 2016-10-24: 40 mg via INTRAVENOUS
  Filled 2016-10-24: qty 4

## 2016-10-24 MED ORDER — DEXTROSE 5 % IV SOLN
500.0000 mg | INTRAVENOUS | Status: DC
  Administered 2016-10-25: 500 mg via INTRAVENOUS
  Filled 2016-10-24 (×2): qty 500

## 2016-10-24 MED ORDER — METHYLPREDNISOLONE SODIUM SUCC 40 MG IJ SOLR
40.0000 mg | Freq: Every day | INTRAMUSCULAR | Status: DC
  Administered 2016-10-24 – 2016-10-25 (×2): 40 mg via INTRAVENOUS
  Filled 2016-10-24 (×2): qty 1

## 2016-10-24 MED ORDER — ACETAMINOPHEN 650 MG RE SUPP: 650.0000 mg | Freq: Four times a day (QID) | RECTAL | Status: DC | PRN

## 2016-10-24 MED ORDER — ACETAMINOPHEN 325 MG PO TABS
650.0000 mg | ORAL_TABLET | Freq: Four times a day (QID) | ORAL | Status: DC | PRN
  Administered 2016-10-28: 650 mg via ORAL
  Filled 2016-10-24: qty 2

## 2016-10-24 MED ORDER — NICOTINE 21 MG/24HR TD PT24
21.0000 mg | MEDICATED_PATCH | Freq: Every day | TRANSDERMAL | Status: DC
  Administered 2016-10-24 – 2016-10-30 (×7): 21 mg via TRANSDERMAL
  Filled 2016-10-24 (×7): qty 1

## 2016-10-24 MED ORDER — HEPARIN (PORCINE) IN NACL 100-0.45 UNIT/ML-% IJ SOLN
1400.0000 [IU]/h | INTRAMUSCULAR | Status: DC
Start: 2016-10-24 — End: 2016-10-27
  Administered 2016-10-24 – 2016-10-26 (×2): 1400 [IU]/h via INTRAVENOUS
  Filled 2016-10-24 (×3): qty 250
  Filled 2016-10-24: qty 3500

## 2016-10-24 MED ORDER — FLUTICASONE PROPIONATE 50 MCG/ACT NA SUSP
1.0000 | Freq: Every day | NASAL | Status: DC
  Administered 2016-10-24 – 2016-10-29 (×6): 1 via NASAL
  Filled 2016-10-24: qty 16

## 2016-10-24 MED ORDER — HEPARIN BOLUS VIA INFUSION
4000.0000 [IU] | Freq: Once | INTRAVENOUS | Status: AC
  Administered 2016-10-24: 4000 [IU] via INTRAVENOUS
  Filled 2016-10-24: qty 4000

## 2016-10-24 MED ORDER — AZITHROMYCIN 500 MG IV SOLR
500.0000 mg | Freq: Once | INTRAVENOUS | Status: AC
  Administered 2016-10-24: 500 mg via INTRAVENOUS
  Filled 2016-10-24: qty 500

## 2016-10-24 MED ORDER — LORATADINE 10 MG PO TABS
10.0000 mg | ORAL_TABLET | Freq: Every day | ORAL | Status: DC
  Administered 2016-10-25 – 2016-10-30 (×6): 10 mg via ORAL
  Filled 2016-10-24 (×6): qty 1

## 2016-10-24 MED ORDER — LOSARTAN POTASSIUM 25 MG PO TABS
12.5000 mg | ORAL_TABLET | Freq: Every day | ORAL | Status: DC
  Administered 2016-10-24 – 2016-10-25 (×2): 12.5 mg via ORAL
  Filled 2016-10-24 (×2): qty 1

## 2016-10-24 MED ORDER — MUPIROCIN 2 % EX OINT
1.0000 "application " | TOPICAL_OINTMENT | Freq: Two times a day (BID) | CUTANEOUS | Status: DC
  Administered 2016-10-24 – 2016-10-30 (×12): 1 via TOPICAL
  Filled 2016-10-24: qty 22

## 2016-10-24 NOTE — Consult Note (Signed)
Cardiology Consult    Patient ID: [name] MRN: [id number], DOB/AGE: 09/19/1985   Admit date: 10/24/2016 Date of Consult: 10/24/2016  Primary Physician: Patient, No Pcp Per Primary Cardiologist: prev seen by A. Yvone Neu, MD now M. Fletcher Anon, MD  Requesting Provider: R. Leslye Peer, MD  Patient Profile    Carlos Brewer. is a 31 y.o. male with a recent h/o CAP, who is being seen today for the evaluation of chest pain and NSTEMI at the request of Carlos Brewer.  Past Medical History   Past Medical History:  Diagnosis Date  . Community acquired pneumonia    a. Bilat - dx 10/12/2016.  . Morbid obesity ([place])   . Palpitations   . Tobacco abuse     Past Surgical History:  Procedure Laterality Date  . [place] EXTRACTION  2008    Allergies  No Known Allergies  History of Present Illness    31 y/o ? with a h/o tobacco abuse.  He was seen by A. Ingal, MD for palpitations in 06/2016 in the setting of up to 7 caffeinated beverages daily.  He was advised to cut caffeine and no other testing was performed.  He was recently evaluated @ urgent care on 7/29 for dyspnea, cough, and nasal congestion.  CXR showed bibasilar opacities worrisome for pna.  He was also felt to have otitis media of the left ear.  He was placed on augmentin and doxycycline after a few days, noted improvement in dyspnea and congestion. He continued to work throughout the duration of his illness. He does exert himself a fair amount of work and he was not having limitations in work-related activities.  He did note mild wheezing yesterday morning but this eventually resolved.  This morning, he awoke at about 7 AM and noted wheezing with dyspnea and retrosternal chest discomfort. It felt like there was a bar squeezing down on his chest.he took his morning medications which included antibiotics, without relief.after 2 hours of ongoing symptoms, he po the emergency department. Here, ECG was nonacute however, troponin was  elevated at 3.81.  Chest x-ray showed worsening of bibasilar pneumonia. He was placed on IV heparin and also treated with IV rocephin and azithromycin.  He was admitted and we've been asked to eval.  He currently denies c/p or dyspnea.  Troponin has risen further to 5.38.  Inpatient Medications    . aspirin EC  81 mg Oral Daily  . atorvastatin  80 mg Oral q1800  . budesonide (PULMICORT) nebulizer solution  0.5 mg Nebulization BID  . carvedilol  3.125 mg Oral BID WC  . fluticasone  1 spray Each Nare QHS  . ipratropium-albuterol  3 mL Nebulization Q6H  . [START ON 10/25/2016] loratadine  10 mg Oral Daily  . losartan  12.5 mg Oral Daily  . methylPREDNISolone (SOLU-MEDROL) injection  40 mg Intravenous Daily  . mupirocin ointment  1 application Topical BID    Family History    Family History  Problem Relation Age of Onset  . Healthy Mother   . Healthy Father     Social History    Social History   Social History  . Marital status: Single    Spouse name: N/A  . Number of children: N/A  . Years of education: N/A   Occupational History  . Not on file.   Social History Main Topics  . Smoking status: Current Every Day Smoker    Packs/day: 0.25    Types: Cigarettes  .  Smokeless tobacco: Never Used     Comment: about 5 cigarettes day.    . Alcohol use No     Comment: none  . Drug use: No     Comment: Used cocaine, stopped 03/2016  . Sexual activity: Yes   Other Topics Concern  . Not on file   Social History Narrative   Lives in [place] with his spouse.  He does not routinely exercise.     Review of Systems    General:  No chills, fever, night sweats or weight changes.  Cardiovascular:  +++ chest pain, +++ dyspnea on exertion, no edema, orthopnea, palpitations, paroxysmal nocturnal dyspnea. Dermatological: No rash, lesions/masses Respiratory: +++ cough, +++ dyspnea, +++ wheezing Urologic: No hematuria, dysuria Abdominal:   +++ nausea, vomiting after prolonged coughing  episode, no diarrhea, bright red blood per rectum, melena, or hematemesis Neurologic:  No visual changes, wkns, changes in mental status. All other systems reviewed and are otherwise negative except as noted above.  Physical Exam    Blood pressure (!) 144/96, pulse 89, temperature 97.8 F (36.6 C), temperature source Oral, resp. rate 18, height 6' (1.829 m), weight 280 lb (127 kg), SpO2 96 %.  General: Pleasant, NAD Psych: Normal affect. Neuro: Alert and oriented X 3. Moves all extremities spontaneously. HEENT: Normal  Neck: Supple without bruits or JVD. Lungs:  Resp regular and unlabored, CTA. Heart: RRR no s3, s4, or murmurs. Abdomen: Soft, non-tender, non-distended, BS + x 4.  Extremities: No clubbing, cyanosis or edema. DP/PT/Radials 2+ and equal bilaterally.  Labs     Recent Labs  10/24/16 0954 10/24/16 1437  TROPONINI 3.81* 5.38*   Lab Results  Component Value Date   WBC 13.3 (H) 10/24/2016   HGB 14.6 10/24/2016   HCT 42.7 10/24/2016   MCV 88.6 10/24/2016   PLT 306 10/24/2016     Recent Labs Lab 10/24/16 0954  NA 138  K 3.7  CL 101  CO2 27  BUN 10  CREATININE 1.14  CALCIUM 9.5  GLUCOSE 125*    Radiology Studies    Dg Chest 2 View  Result Date: 10/24/2016 CLINICAL DATA:  Chest pain. EXAM: CHEST  2 VIEW COMPARISON:  10/12/2016 FINDINGS: Interval progression of the bibasilar airspace disease suggests worsening multifocal pneumonia. Cardiopericardial silhouette is at upper limits of normal for size. The visualized bony structures of the thorax are intact. IMPRESSION: Interval progression of bibasilar airspace disease suggests worsening multifocal pneumonia. Electronically Signed   By: Misty Stanley CarlosD.   On: 10/24/2016 10:20   Dg Chest 2 View  Result Date: 10/12/2016 CLINICAL DATA:  Shortness of breath, cough EXAM: CHEST  2 VIEW COMPARISON:  06/23/2016 FINDINGS: Patchy bilateral airspace opacities in the lower lobes. Heart is mildly enlarged. Mild  peribronchial thickening. No effusions or acute bony abnormality. IMPRESSION: Cardiomegaly. Bronchitic changes. Patchy bilateral lower lobe airspace opacities concerning for pneumonia. Electronically Signed   By: Rolm Baptise CarlosD.   On: 10/12/2016 11:05   Dg Forearm Left  Result Date: 10/20/2016 CLINICAL DATA:  Laceration with removal of metallic soft tissue foreign body. EXAM: LEFT FOREARM - 2 VIEW COMPARISON:  None. FINDINGS: Two view exam of the left forearm shows skin staples over the posterolateral aspect of the proximal soft tissues. Gas in the soft tissues of this region is compatible with the reported history of laceration. There is no associated radiopaque soft tissue foreign body. IMPRESSION: No unexpected radiopaque soft tissue foreign body in the forearm. Electronically Signed   By:  Misty Stanley CarlosD.   On: 10/20/2016 20:53    ECG & Cardiac Imaging    ST, 107, RAE, LVH.  Assessment & Plan    1.  NSTEMI:  Pt w/o prior personal or FH of CAD.  RF include HTN, tob abuse.  He remotely used cocaine.  He was recently dx and treated for PNA in the outpt setting with initial clinical improvement but then he developed mild wheezing yesterday and again today.  Today he was also more profoundly dyspneic and developed retrosternal chest tightness, prompting ER visitation.  Here, CXR shows worsening PNA.  ECG non-acute however, troponin elevated @ 3.81  5.38.  He is currently chest pain free.  Prelim echo shows severe LV dysfxn.  Though myocarditis is high on the differential given youth and recent/ongoing resp illness, we will also need to exclude obstructive CAD.  Cont asa/heparin.  I will add oral  blocker, high potency statin, and low dose ARB.  Plan on cath on Monday once he recovers some from PNA.  2.  CAP:  Worsening PNA noted on cxr despite outpt abx since 7/29.  He was noting improvement in Ss until this AM.  Abx per IM.  3.  Elevated blood pressure: No prior h/o HTN.  Adding low dose   blocker and ARB in the setting of above.  4.  Cardiomyopathy:  Marked LV dysfxn on echo in setting of above.  Adding low dose  blocker and ARB.  Will consider entresto as we move forward with eval - would like to see how bp tolerates  blocker/ARB first.  5.  Lipids: Add high potency statin in setting of ACS.  F/u lipids  6.  Tob Abuse:  Cessation advised.  7.  H/o cocaine abuse:  UDS pending.  He denies any recent drug use.  Signed, Murray Hodgkins, NP 10/24/2016, 4:50 PM

## 2016-10-24 NOTE — Progress Notes (Signed)
ANTICOAGULATION CONSULT NOTE - Initial Consult  Pharmacy Consult for Heparin Drip Indication: chest pain/ACS  No Known Allergies  Patient Measurements: Height: 6' (182.9 cm) Weight: 280 lb (127 kg) IBW/kg (Calculated) : 77.6 Heparin Dosing Weight: 106 kg  Vital Signs: Temp: 98.6 F (37 C) (08/10 1001) Temp Source: Oral (08/10 1001) BP: 121/95 (08/10 1201) Pulse Rate: 96 (08/10 1201)  Labs:  Recent Labs  10/24/16 0954 10/24/16 1133  HGB 14.6  --   HCT 42.7  --   PLT 306  --   APTT  --  27  LABPROT  --  12.7  INR  --  0.95  CREATININE 1.14  --   TROPONINI 3.81*  --     Estimated Creatinine Clearance: 128.2 mL/min (by C-G formula based on SCr of 1.14 mg/dL).   Medical History: History reviewed. No pertinent past medical history.  Medications:  Scheduled:  . budesonide (PULMICORT) nebulizer solution  0.5 mg Nebulization BID  . fluticasone  1 spray Each Nare QHS  . ipratropium-albuterol  3 mL Nebulization Q6H  . [START ON 10/25/2016] loratadine  10 mg Oral Daily  . methylPREDNISolone (SOLU-MEDROL) injection  40 mg Intravenous Daily  . mupirocin ointment  1 application Topical BID   Infusions:  . [START ON 10/25/2016] azithromycin    . cefTRIAXone (ROCEPHIN)  IV    . [START ON 10/25/2016] cefTRIAXone (ROCEPHIN) 1 g IVPB    . heparin 1,400 Units/hr (10/24/16 1212)    Assessment: 31 yo M w/ CP, SOB and elevated troponin. Suspect ?myocarditis.   Hgb 14.6  Plt 306  INR 0.95  APTT 27  Goal of Therapy:  Heparin level 0.3-0.7 units/ml Monitor platelets by anticoagulation protocol: Yes   Plan:  Give 4000 units bolus x 1 Start heparin infusion at 1400 units/hr Check anti-Xa level in 6 hours and daily while on heparin Continue to monitor H&H and platelets  Carlos Brewer A 10/24/2016,12:50 PM

## 2016-10-24 NOTE — ED Notes (Signed)
Lab reported pt has a troponin of 3.81, charge nurse notified and pt taken to room 4.

## 2016-10-24 NOTE — H&P (Signed)
[facility] [place] NAME: Carlos Brewer    MR#:  [id number]  DATE OF BIRTH:  September 23, 1985  DATE OF ADMISSION:  10/24/2016  PRIMARY CARE PHYSICIAN: Patient, No Pcp Per   REQUESTING/REFERRING PHYSICIAN: Dr Lavonia Drafts  CHIEF COMPLAINT:   Chief Complaint  Patient presents with  . Chest Pain    HISTORY OF PRESENT ILLNESS:  Carlos Brewer  is a 31 y.o. male with A recent diagnosis of pneumonia on doxycycline and Augmentin. He presents with tightness in his chest starting this morning and it's been constant. Ranging from 4 out of 10 in intensity now and 9 out of 10 intensity when he came in. Lying back helps. It feels like a tightness in ball lying on his chest. He feels like he can't take her breath. He's been wheezing and coughing up white phlegm. He's been coughing so much she is actually been vomiting. In the ER, he is found to have a worsening pneumonia on chest x-ray and a troponin up at 3.81. Hospitalist services were contacted for further evaluation.  PAST MEDICAL HISTORY:   Past Medical History:  Diagnosis Date  . Medical history non-contributory     PAST SURGICAL HISTORY:   Past Surgical History:  Procedure Laterality Date  . WISDOM TOOTH EXTRACTION  2008    SOCIAL HISTORY:   Social History  Substance Use Topics  . Smoking status: Current Every Day Smoker    Packs/day: 0.25    Types: Cigarettes  . Smokeless tobacco: Never Used     Comment: about 5 cigarettes day.    . Alcohol use No     Comment: none    FAMILY HISTORY:   Family History  Problem Relation Age of Onset  . Healthy Mother   . Healthy Father     DRUG ALLERGIES:  No Known Allergies  REVIEW OF SYSTEMS:  CONSTITUTIONAL: No fever, Chills or sweats. Positive for fatigue.  EYES: No blurred or double vision.  EARS, NOSE, AND THROAT: No tinnitus or ear pain. No sore throat RESPIRATORY: Positive for cough, shortness of breath, and wheezing. No  hemoptysis.  CARDIOVASCULAR: No chest pain, orthopnea, edema.  GASTROINTESTINAL: Positive for nausea and vomiting. Positive for diarrhea. No abdominal pain. No blood in bowel movements GENITOURINARY: No dysuria, hematuria.  ENDOCRINE: No polyuria, nocturia,  HEMATOLOGY: No anemia, easy bruising or bleeding SKIN: No rash or lesion. MUSCULOSKELETAL: No joint pain or arthritis.   NEUROLOGIC: No tingling, numbness, weakness. Positive for dizziness. PSYCHIATRY: No anxiety or depression.   MEDICATIONS AT HOME:   Prior to Admission medications   Medication Sig Start Date End Date Taking? Authorizing Provider  amoxicillin-clavulanate (AUGMENTIN) 875-125 MG tablet Take 1 tablet by mouth every 12 (twelve) hours. 10/12/16  Yes Betancourt, Aura Fey, NP  benzonatate (TESSALON) 100 MG capsule Take 1 capsule (100 mg total) by mouth every 8 (eight) hours. 10/12/16  Yes Betancourt, Aura Fey, NP  doxycycline (VIBRAMYCIN) 100 MG capsule Take 1 capsule (100 mg total) by mouth 2 (two) times daily. 10/12/16  Yes Betancourt, Aura Fey, NP  acetaminophen (TYLENOL) 500 MG tablet Take 2 tablets (1,000 mg total) by mouth every 6 (six) hours as needed. 10/12/16   Betancourt, Aura Fey, NP  fluticasone (FLONASE) 50 MCG/ACT nasal spray Place 1 spray into both nostrils 2 (two) times daily. 10/12/16   Betancourt, Aura Fey, NP  loratadine (CLARITIN) 10 MG tablet Take 1 tablet (10 mg total) by mouth daily. 10/12/16   Gerarda Fraction  A, NP  mupirocin ointment (BACTROBAN) 2 % Apply 1 application topically 2 (two) times daily. 10/20/16   Frederich Cha, MD  sodium chloride (OCEAN) 0.65 % SOLN nasal spray Place 2 sprays into both nostrils every 2 (two) hours while awake. 10/12/16 11/11/16  Betancourt, Aura Fey, NP      VITAL SIGNS:  Blood pressure (!) 121/95, pulse 96, temperature 98.6 F (37 C), temperature source Oral, resp. rate (!) 30, height 6' (1.829 m), weight 127 kg (280 lb), SpO2 96 %.  PHYSICAL EXAMINATION:  GENERAL:  31 y.o.-year-old  patient lying in the bed with no acute distress.  EYES: Pupils equal, round, reactive to light and accommodation. No scleral icterus. Extraocular muscles intact.  HEENT: Head atraumatic, normocephalic. Oropharynx and nasopharynx clear.  NECK:  Supple, no jugular venous distention. No thyroid enlargement, no tenderness.  LUNGS: Decreased breath sounds bilaterally, upper airway wheezing. Positive for rhonchi at bases No use of accessory muscles of respiration.  CARDIOVASCULAR: S1, S2 normal. No murmurs, rubs, or gallops.  ABDOMEN: Soft, nontender, nondistended. Bowel sounds present. No organomegaly or mass.  EXTREMITIES: Trace edema. No cyanosis, or clubbing.  NEUROLOGIC: Cranial nerves II through XII are intact. Muscle strength 5/5 in all extremities. Sensation intact. Gait not checked.  PSYCHIATRIC: The patient is alert and oriented x 3.  SKIN: No rash, lesion, or ulcer.   LABORATORY PANEL:   CBC  Recent Labs Lab 10/24/16 0954  WBC 13.3*  HGB 14.6  HCT 42.7  PLT 306   ------------------------------------------------------------------------------------------------------------------  Chemistries   Recent Labs Lab 10/24/16 0954  NA 138  K 3.7  CL 101  CO2 27  GLUCOSE 125*  BUN 10  CREATININE 1.14  CALCIUM 9.5   ------------------------------------------------------------------------------------------------------------------  Cardiac Enzymes  Recent Labs Lab 10/24/16 0954  TROPONINI 3.81*   ------------------------------------------------------------------------------------------------------------------  RADIOLOGY:  Dg Chest 2 View  Result Date: 10/24/2016 CLINICAL DATA:  Chest pain. EXAM: CHEST  2 VIEW COMPARISON:  10/12/2016 FINDINGS: Interval progression of the bibasilar airspace disease suggests worsening multifocal pneumonia. Cardiopericardial silhouette is at upper limits of normal for size. The visualized bony structures of the thorax are intact. IMPRESSION:  Interval progression of bibasilar airspace disease suggests worsening multifocal pneumonia. Electronically Signed   By: Misty Stanley M.D.   On: 10/24/2016 10:20    EKG:   Sinus tachycardia 107 bpm, right atrial enlargement  IMPRESSION AND PLAN:   1. Bilateral worsening pneumonia. Appreciate was on outpatient Augmentin and doxycycline. Switch to IV Rocephin and Zithromax to cover atypicals. Pro calcitonin is negative which goes against a bacterial pneumonia but since the patient is sick at this point I will give antibiotics. Recheck another Pro calcitonin. 2. COPD exacerbation. Start IV Solu-Medrol budesonide and DuoNeb nebulizers. 3. Elevated troponin. This is likely demand ischemia from respiratory issues versus a myocarditis. Check an echocardiogram. Case discussed with Dr. Velva Harman cardiology and he started a heparin drip. I also give aspirin at this time. 4. Tobacco abuse smoking cessation counseling done 4 minutes by me. 5. Send off urine drug toxicology  All the records are reviewed and case discussed with ED provider. Management plans discussed with the patient, family and they are in agreement.  CODE STATUS: full code  TOTAL TIME TAKING CARE OF THIS PATIENT: 50 minutes.    Loletha Grayer M.D on 10/24/2016 at 12:55 PM  Between 7am to 6pm - Pager - [phone]  After 6pm call admission pager [phone]  [facility] Office  [phone]  CC: Primary care physician; Patient, No  Pcp Per

## 2016-10-24 NOTE — Progress Notes (Signed)
ANTICOAGULATION CONSULT NOTE - Initial Consult  Pharmacy Consult for Heparin Drip Indication: chest pain/ACS  No Known Allergies  Patient Measurements: Height: 6' (182.9 cm) Weight: 280 lb (127 kg) IBW/kg (Calculated) : 77.6 Heparin Dosing Weight: 106 kg  Vital Signs: Temp: 97.8 F (36.6 C) (08/10 1410) Temp Source: Oral (08/10 1410) BP: 144/96 (08/10 1410) Pulse Rate: 89 (08/10 1410)  Labs:  Recent Labs  10/24/16 0954 10/24/16 1133 10/24/16 1437 10/24/16 1847  HGB 14.6  --   --   --   HCT 42.7  --   --   --   PLT 306  --   --   --   APTT  --  27  --   --   LABPROT  --  12.7  --   --   INR  --  0.95  --   --   HEPARINUNFRC  --   --   --  0.42  CREATININE 1.14  --   --   --   TROPONINI 3.81*  --  5.38* 4.17*    Estimated Creatinine Clearance: 128.2 mL/min (by C-G formula based on SCr of 1.14 mg/dL).   Medical History: Past Medical History:  Diagnosis Date  . Community acquired pneumonia    a. Bilat - dx 10/12/2016.  . Morbid obesity ([place])   . Palpitations   . Tobacco abuse     Medications:  Scheduled:  . aspirin EC  81 mg Oral Daily  . atorvastatin  80 mg Oral q1800  . budesonide (PULMICORT) nebulizer solution  0.5 mg Nebulization BID  . carvedilol  3.125 mg Oral BID WC  . fluticasone  1 spray Each Nare QHS  . ipratropium-albuterol  3 mL Nebulization Q6H  . [START ON 10/25/2016] loratadine  10 mg Oral Daily  . losartan  12.5 mg Oral Daily  . methylPREDNISolone (SOLU-MEDROL) injection  40 mg Intravenous Daily  . mupirocin ointment  1 application Topical BID   Infusions:  . [START ON 10/25/2016] azithromycin    . [START ON 10/25/2016] cefTRIAXone (ROCEPHIN) 1 g IVPB    . heparin 1,400 Units/hr (10/24/16 1212)    Assessment: 31 yo M w/ CP, SOB and elevated troponin. Suspect ?myocarditis.   Hgb 14.6  Plt 306  INR 0.95  APTT 27  Goal of Therapy:  Heparin level 0.3-0.7 units/ml Monitor platelets by anticoagulation protocol: Yes   Plan:   8/10  1847 Anti-Xa level therapeutic at 0.42. Will continue current rate of heparin 1400units/hr and recheck level in 6 hours. CBC with AM labs per protocol.    Pernell Dupre, PharmD, BCPS Clinical Pharmacist 10/24/2016 7:54 PM

## 2016-10-24 NOTE — ED Triage Notes (Signed)
Pt states he was seen at [facility] urgent care last week and dx with pneumonia , states he continues to have cough with congestion, now having chest pain with increased SOB.. Pt is in NAD, ambulatory to triage with no distress noted.Marland Kitchen

## 2016-10-24 NOTE — Progress Notes (Signed)
Pharmacy Antibiotic Note  Carlos Brewer. is a 31 y.o. male admitted on 10/24/2016 with pneumonia.  Pharmacy has been consulted for azithomycin/ceftriaxone dosing.  Patient went to urgent care 10 days ago and was diagnosed with a pneumonia via x-ray he completed his course of antibiotics (Doxycycline/Augmentin) and had been feeling better then developed SOB again.    Plan: Patient received Azithromycin 500mg  x 1 and Ceftriaxone 1 gram x 1 in ER. Will continue with Azithromycin 500mg  Q24h and and Ceftriaxone 1 gram IV q24h.    Height: 6' (182.9 cm) Weight: 280 lb (127 kg) IBW/kg (Calculated) : 77.6  Temp (24hrs), Avg:98.6 F (37 C), Min:98.6 F (37 C), Max:98.6 F (37 C)   Recent Labs Lab 10/24/16 0954 10/24/16 1118  WBC 13.3*  --   CREATININE 1.14  --   LATICACIDVEN  --  1.7    Estimated Creatinine Clearance: 128.2 mL/min (by C-G formula based on SCr of 1.14 mg/dL).    No Known Allergies  Antimicrobials this admission: CTX 8/10 >>   Azith8/10   >>    Dose adjustments this admission:    Microbiology results: 8/10 BCx: pending   UCx:      Sputum:      MRSA PCR:    Thank you for allowing pharmacy to be a part of this patient's care.  Patsie Mccardle A 10/24/2016 12:45 PM

## 2016-10-24 NOTE — ED Provider Notes (Signed)
[facility] Emergency Department Provider Note   ____________________________________________    I have reviewed the triage vital signs and the nursing notes.   HISTORY  Chief Complaint Chest Pain     HPI Carlos Brewer. is a 31 y.o. male who presents with complaints of chest pain and shortness of breath. Patient reports he was seen at urgent care 10 days ago and was diagnosed with a pneumonia via x-ray he completed his course of antibiotics and had been feeling better however the last couple of days he developed shortness of breath again. This morning he then developed chest tightness which she has never had before. He denies fevers, reports intermittent cough. No recent travel. No history of DVT. No leg swelling. No history of cardiac disease.   History reviewed. No pertinent past medical history.  There are no active problems to display for this patient.   Past Surgical History:  Procedure Laterality Date  . [place] EXTRACTION  2008    Prior to Admission medications   Medication Sig Start Date End Date Taking? Authorizing Provider  acetaminophen (TYLENOL) 500 MG tablet Take 2 tablets (1,000 mg total) by mouth every 6 (six) hours as needed. 10/12/16   Betancourt, Aura Fey, NP  amoxicillin-clavulanate (AUGMENTIN) 875-125 MG tablet Take 1 tablet by mouth every 12 (twelve) hours. 10/12/16   Betancourt, Aura Fey, NP  benzonatate (TESSALON) 100 MG capsule Take 1 capsule (100 mg total) by mouth every 8 (eight) hours. 10/12/16   Betancourt, Aura Fey, NP  doxycycline (VIBRAMYCIN) 100 MG capsule Take 1 capsule (100 mg total) by mouth 2 (two) times daily. 10/12/16   Betancourt, Aura Fey, NP  fluticasone (FLONASE) 50 MCG/ACT nasal spray Place 1 spray into both nostrils 2 (two) times daily. 10/12/16   Betancourt, Aura Fey, NP  loratadine (CLARITIN) 10 MG tablet Take 1 tablet (10 mg total) by mouth daily. 10/12/16   Betancourt, Aura Fey, NP  mupirocin ointment  (BACTROBAN) 2 % Apply 1 application topically 2 (two) times daily. 10/20/16   Frederich Cha, MD  sodium chloride (OCEAN) 0.65 % SOLN nasal spray Place 2 sprays into both nostrils every 2 (two) hours while awake. 10/12/16 11/11/16  BetancourtAura Fey, NP     Allergies Patient has no known allergies.  No family history on file.  Social History Social History  Substance Use Topics  . Smoking status: Current Every Day Smoker    Packs/day: 0.25    Types: Cigarettes  . Smokeless tobacco: Never Used     Comment: about 5 cigarettes day.    . Alcohol use No     Comment: none    Review of Systems  Constitutional: No fever/chills Eyes: No visual changes.  ENT: No sore throat. Cardiovascular: As above Respiratory: As above Gastrointestinal: No abdominal pain.  No nausea, no vomiting.   Genitourinary: Negative for dysuria. Musculoskeletal: Negative for back pain. Skin: Negative for rash. Neurological: Negative for headaches   ____________________________________________   PHYSICAL EXAM:  VITAL SIGNS: ED Triage Vitals  Enc Vitals Group     BP 10/24/16 1001 (!) 149/92     Pulse Rate 10/24/16 1001 (!) 105     Resp 10/24/16 1001 (!) 24     Temp 10/24/16 1001 98.6 F (37 C)     Temp Source 10/24/16 1001 Oral     SpO2 10/24/16 1001 94 %     Weight 10/24/16 0952 127 kg (280 lb)     Height 10/24/16 0952  1.829 m (6')     Head Circumference --      Peak Flow --      Pain Score 10/24/16 0952 7     Pain Loc --      Pain Edu? --      Excl. in [place]? --     Constitutional: Alert and oriented. No acute distress.  Eyes: Conjunctivae are normal.  . Nose: No congestion/rhinnorhea. Mouth/Throat: Mucous membranes are moist.    Cardiovascular: Normal rate, regular rhythm. No apparent rub although patient complains of worse pain in his chest when he leans forward. Good peripheral circulation. Respiratory: Normal respiratory effort.  No retractions. Lungs CTAB. Gastrointestinal: Soft and  nontender. No distention.  No CVA tenderness. Genitourinary: deferred Musculoskeletal: No lower extremity tenderness nor edema.  Warm and well perfused Neurologic:  Normal speech and language. No gross focal neurologic deficits are appreciated.  Skin:  Skin is warm, dry and intact. No rash noted. Psychiatric: Mood and affect are normal. Speech and behavior are normal.  ____________________________________________   LABS (all labs ordered are listed, but only abnormal results are displayed)  Labs Reviewed  BASIC METABOLIC PANEL - Abnormal; Notable for the following:       Result Value   Glucose, Bld 125 (*)    All other components within normal limits  CBC - Abnormal; Notable for the following:    WBC 13.3 (*)    All other components within normal limits  TROPONIN I - Abnormal; Notable for the following:    Troponin I 3.81 (*)    All other components within normal limits  CULTURE, BLOOD (ROUTINE X 2)  CULTURE, BLOOD (ROUTINE X 2)  LACTIC ACID, PLASMA  LACTIC ACID, PLASMA  BRAIN NATRIURETIC PEPTIDE   ____________________________________________  EKG  ED ECG REPORT I, Lavonia Drafts, the attending physician, personally viewed and interpreted this ECG.  Date: 10/24/2016  Rhythm: normal sinus rhythm QRS Axis: normal Intervals: normal ST/T Wave abnormalities: normal Narrative Interpretation: No significant change from prior  ____________________________________________  RADIOLOGY  Multi lobar pneumonia on chest x-ray ____________________________________________   PROCEDURES  Procedure(s) performed: No    Critical Care performed:yes  CRITICAL CARE Performed by: Lavonia Drafts   Total critical care time: 30 minutes  Critical care time was exclusive of separately billable procedures and treating other patients.  Critical care was necessary to treat or prevent imminent or life-threatening deterioration.  Critical care was time spent personally by me on the  following activities: development of treatment plan with patient and/or surrogate as well as nursing, discussions with consultants, evaluation of patient's response to treatment, examination of patient, obtaining history from patient or surrogate, ordering and performing treatments and interventions, ordering and review of laboratory studies, ordering and review of radiographic studies, pulse oximetry and re-evaluation of patient's condition. ____________________________________________   INITIAL IMPRESSION / ASSESSMENT AND PLAN / ED COURSE  Pertinent labs & imaging results that were available during my care of the patient were reviewed by me and considered in my medical decision making (see chart for details).  Patient presents with chest pain and shortness of breath. Recently treated for pneumonia. His pneumonia seems to have worsened on chest x-ray, more alarmingly his troponin is significantly elevated. Chest pain is worse when lying forward, EKG is unchanged from prior. Strongly suspect myocarditis. Discussed with Dr. Fletcher Anon of cardiology who agrees and recommends starting heparin. Discussed with hospitalist for admission     ____________________________________________   FINAL CLINICAL IMPRESSION(S) / ED DIAGNOSES  Final diagnoses:  Acute infective myocarditis, due to unspecified organism      NEW MEDICATIONS STARTED DURING THIS VISIT:  New Prescriptions   No medications on file     Note:  This document was prepared using Dragon voice recognition software and may include unintentional dictation errors.    Lavonia Drafts, MD 10/24/16 [phone]

## 2016-10-24 NOTE — Progress Notes (Signed)
*  PRELIMINARY RESULTS* Echocardiogram 2D Echocardiogram has been performed.  Sherrie Sport 10/24/2016, 3:55 PM

## 2016-10-25 LAB — HEPARIN LEVEL (UNFRACTIONATED): HEPARIN UNFRACTIONATED: 0.34 [IU]/mL (ref 0.30–0.70)

## 2016-10-25 LAB — CBC
HCT: 41 % (ref 40.0–52.0)
HEMOGLOBIN: 14 g/dL (ref 13.0–18.0)
MCH: 30.5 pg (ref 26.0–34.0)
MCHC: 34.2 g/dL (ref 32.0–36.0)
MCV: 89.1 fL (ref 80.0–100.0)
Platelets: 295 10*3/uL (ref 150–440)
RBC: 4.6 MIL/uL (ref 4.40–5.90)
RDW: 13 % (ref 11.5–14.5)
WBC: 8.8 10*3/uL (ref 3.8–10.6)

## 2016-10-25 LAB — BASIC METABOLIC PANEL
ANION GAP: 8 (ref 5–15)
BUN: 15 mg/dL (ref 6–20)
CHLORIDE: 104 mmol/L (ref 101–111)
CO2: 27 mmol/L (ref 22–32)
Calcium: 9.5 mg/dL (ref 8.9–10.3)
Creatinine, Ser: 1.26 mg/dL — ABNORMAL HIGH (ref 0.61–1.24)
GFR calc Af Amer: >60 mL/min (ref >60)
GFR calc non Af Amer: >60 mL/min (ref >60)
Glucose, Bld: 201 mg/dL — ABNORMAL HIGH (ref 65–99)
POTASSIUM: 3.7 mmol/L (ref 3.5–5.1)
SODIUM: 139 mmol/L (ref 135–145)

## 2016-10-25 LAB — HIV ANTIBODY (ROUTINE TESTING W REFLEX): HIV SCREEN 4TH GENERATION: NONREACTIVE

## 2016-10-25 MED ORDER — FUROSEMIDE 10 MG/ML IJ SOLN
40.0000 mg | Freq: Two times a day (BID) | INTRAMUSCULAR | Status: DC
  Administered 2016-10-25 – 2016-10-26 (×2): 40 mg via INTRAVENOUS
  Filled 2016-10-25 (×2): qty 4

## 2016-10-25 MED ORDER — SODIUM CHLORIDE 0.9% FLUSH
3.0000 mL | Freq: Two times a day (BID) | INTRAVENOUS | Status: DC
  Administered 2016-10-25 – 2016-10-29 (×4): 3 mL via INTRAVENOUS

## 2016-10-25 MED ORDER — SPIRONOLACTONE 25 MG PO TABS
12.5000 mg | ORAL_TABLET | Freq: Every day | ORAL | Status: DC
  Administered 2016-10-25 – 2016-10-30 (×6): 12.5 mg via ORAL
  Filled 2016-10-25 (×6): qty 1

## 2016-10-25 MED ORDER — POTASSIUM CHLORIDE CRYS ER 20 MEQ PO TBCR
40.0000 meq | EXTENDED_RELEASE_TABLET | Freq: Once | ORAL | Status: AC
  Administered 2016-10-25: 40 meq via ORAL
  Filled 2016-10-25: qty 2

## 2016-10-25 MED ORDER — IPRATROPIUM-ALBUTEROL 0.5-2.5 (3) MG/3ML IN SOLN
3.0000 mL | Freq: Three times a day (TID) | RESPIRATORY_TRACT | Status: DC
  Administered 2016-10-25 – 2016-10-26 (×3): 3 mL via RESPIRATORY_TRACT
  Filled 2016-10-25 (×3): qty 3

## 2016-10-25 MED ORDER — LOSARTAN POTASSIUM 25 MG PO TABS
12.5000 mg | ORAL_TABLET | Freq: Two times a day (BID) | ORAL | Status: DC
  Administered 2016-10-25 – 2016-10-26 (×2): 12.5 mg via ORAL
  Filled 2016-10-25 (×2): qty 1

## 2016-10-25 NOTE — Progress Notes (Signed)
[facility] [place] NAME: Carlos Brewer    MR#:  [id number]  DATE OF BIRTH:  December 09, 1985  SUBJECTIVE:  CHIEF COMPLAINT:   Chief Complaint  Patient presents with  . Chest Pain   Still chest pain and shortness of breath, on O2 [place] 2L this am. REVIEW OF SYSTEMS:  Review of Systems  Constitutional: Negative for chills, fever and malaise/fatigue.  HENT: Negative for sore throat.   Eyes: Negative for blurred vision and double vision.  Respiratory: Positive for cough, sputum production and shortness of breath. Negative for hemoptysis, wheezing and stridor.   Cardiovascular: Positive for chest pain. Negative for palpitations, orthopnea and leg swelling.  Gastrointestinal: Negative for abdominal pain, blood in stool, diarrhea, melena, nausea and vomiting.  Genitourinary: Negative for dysuria, flank pain and hematuria.  Musculoskeletal: Negative for back pain and joint pain.  Skin: Negative for rash.  Neurological: Negative for dizziness, sensory change, focal weakness, seizures, loss of consciousness, weakness and headaches.  Endo/Heme/Allergies: Negative for polydipsia.  Psychiatric/Behavioral: Negative for depression. The patient is nervous/anxious.     DRUG ALLERGIES:  No Known Allergies VITALS:  Blood pressure 118/82, pulse 91, temperature (!) 97.4 F (36.3 C), temperature source Oral, resp. rate 18, height 6' (1.829 m), weight 280 lb (127 kg), SpO2 97 %. PHYSICAL EXAMINATION:  Physical Exam  Constitutional: He is oriented to person, place, and time and well-developed, well-nourished, and in no distress.  Morbid obesity.  HENT:  Head: Normocephalic.  Mouth/Throat: Oropharynx is clear and moist.  Eyes: Pupils are equal, round, and reactive to light. Conjunctivae and EOM are normal. No scleral icterus.  Neck: Normal range of motion. Neck supple. No JVD present. No tracheal deviation present.  Cardiovascular: Normal rate, regular  rhythm and normal heart sounds.  Exam reveals no gallop.   No murmur heard. Pulmonary/Chest: Effort normal and breath sounds normal. No respiratory distress. He has no wheezes. He has no rales.  Abdominal: Soft. Bowel sounds are normal. He exhibits no distension. There is no tenderness. There is no rebound.  Musculoskeletal: Normal range of motion. He exhibits no edema or tenderness.  Neurological: He is alert and oriented to person, place, and time. No cranial nerve deficit.  Skin: No rash noted. No erythema.  Psychiatric: Affect normal.   LABORATORY PANEL:  Male CBC  Recent Labs Lab 10/25/16 0106  WBC 8.8  HGB 14.0  HCT 41.0  PLT 295   ------------------------------------------------------------------------------------------------------------------ Chemistries   Recent Labs Lab 10/25/16 0106  NA 139  K 3.7  CL 104  CO2 27  GLUCOSE 201*  BUN 15  CREATININE 1.26*  CALCIUM 9.5   RADIOLOGY:  No results found. ASSESSMENT AND PLAN:    1.  Acute systolic CHF with cardiomyopathy: Echo this admission with moderate LV dilation, EF 15-20%.  Per Dr. Aundra Dubin,  [place] with bilateral airspace disease, may actually be all pulmonary edema rather than PNA.  - Lasix 40 mg IV bid, give dose now and replace K.  - Can continue low dose Coreg.  - Increase losartan to 12.5 mg bid.  Eventual transition to [facility].  - Add spironolactone 12.5 mg daily.  - Plan for right/left heart cath on Monday.  2.  NSTEMI continue heparin drip, ASA and Lipitor. Plan for right/left heart cath on Monday.  3. Bilateral pulmonary edema, not pneumonia.  He was on outpatient Augmentin and doxycycline.  Discontinue IV Rocephin and Zithromax. Pro calcitonin are negative which is against bacterial pneumonia.  Repeat chest x-ray in a.m.  Not COPD exacerbation. Discontinue IV Solu-Medrol, continue budesonide, Continue DuoNeb nebulizers.  4. Tobacco abuse smoking cessation counseling done 4 minutes by me. 5.  Cocaine abuse. Urine toxicology showed positive cocaine.   Morbid obesity. Check lipid panel and hemoglobin A1c.  Pt was in the [employer] and has PTSD. Follow up psych consult.  All the records are reviewed and case discussed with Care Management/Social Worker. Management plans discussed with the patient, family and they are in agreement.  CODE STATUS: Full Code  TOTAL TIME TAKING CARE OF THIS PATIENT: 42 minutes.   More than 50% of the time was spent in counseling/coordination of care: YES  POSSIBLE D/C IN 3 DAYS, DEPENDING ON CLINICAL CONDITION.   Demetrios Loll M.D on 10/25/2016 at 3:19 PM  Between 7am to 6pm - Pager - [phone]  After 6pm go to [email or web address] - Proofreader  [facility]  Office  [phone]  CC: Primary care physician; Patient, No Pcp Per  Note: This dictation was prepared with Dragon dictation along with smaller phrase technology. Any transcriptional errors that result from this process are unintentional.

## 2016-10-25 NOTE — Progress Notes (Signed)
Patient ID: Carlos Brewer., male   DOB: 1985-08-07, 31 y.o.   MRN: [id number]      Subjective:    Breathing better today after getting IV Lasix yesterday.  Diuresed well. No chest pain.   Objective:   Weight Range: 280 lb (127 kg) Body mass index is 37.97 kg/m.   Vital Signs:   Temp:  [97.8 F (36.6 C)-98.6 F (37 C)] 98.6 F (37 C) (08/10 2010) Pulse Rate:  [89-98] 98 (08/11 0538) Resp:  [18-24] 18 (08/11 0538) BP: (125-144)/(72-96) 125/88 (08/11 0538) SpO2:  [94 %-98 %] 95 % (08/11 0538) Last BM Date: 10/23/16  Weight change: Filed Weights   10/24/16 0952  Weight: 280 lb (127 kg)    Intake/Output:   Intake/Output Summary (Last 24 hours) at 10/25/16 1247 Last data filed at 10/25/16 1127  Gross per 24 hour  Intake            489.2 ml  Output             3250 ml  Net          -2760.8 ml      Physical Exam    General:  Well appearing. No resp difficulty HEENT: Normal Neck: Supple. JVP 14 cm. Carotids 2+ bilat; no bruits. No lymphadenopathy or thyromegaly appreciated. Cor: PMI nondisplaced. Regular rate & rhythm. +S3.  No murmur. Lungs: Clear Abdomen: Soft, nontender, nondistended. No hepatosplenomegaly. No bruits or masses. Good bowel sounds. Extremities: No cyanosis, clubbing, rash, edema Neuro: Alert & orientedx3, cranial nerves grossly intact. moves all 4 extremities w/o difficulty. Affect pleasant   Telemetry   Personally reviewed, NSR  Labs    CBC  Recent Labs  10/24/16 0954 10/25/16 0106  WBC 13.3* 8.8  HGB 14.6 14.0  HCT 42.7 41.0  MCV 88.6 89.1  PLT 306 518   Basic Metabolic Panel  Recent Labs  10/24/16 0954 10/25/16 0106  NA 138 139  K 3.7 3.7  CL 101 104  CO2 27 27  GLUCOSE 125* 201*  BUN 10 15  CREATININE 1.14 1.26*  CALCIUM 9.5 9.5   Liver Function Tests No results for input(s): AST, ALT, ALKPHOS, BILITOT, PROT, ALBUMIN in the last 72 hours. No results for input(s): LIPASE, AMYLASE in the last 72 hours. Cardiac  Enzymes  Recent Labs  10/24/16 0954 10/24/16 1437 10/24/16 1847  TROPONINI 3.81* 5.38* 4.17*    BNP: BNP (last 3 results)  Recent Labs  10/24/16 1133  BNP 676.0*    ProBNP (last 3 results) No results for input(s): PROBNP in the last 8760 hours.   D-Dimer No results for input(s): DDIMER in the last 72 hours. Hemoglobin A1C No results for input(s): HGBA1C in the last 72 hours. Fasting Lipid Panel No results for input(s): CHOL, HDL, LDLCALC, TRIG, CHOLHDL, LDLDIRECT in the last 72 hours. Thyroid Function Tests No results for input(s): TSH, T4TOTAL, T3FREE, THYROIDAB in the last 72 hours.  Invalid input(s): FREET3  Other results:   Imaging     No results found.   Medications:     Scheduled Medications: . aspirin EC  81 mg Oral Daily  . atorvastatin  80 mg Oral q1800  . budesonide (PULMICORT) nebulizer solution  0.5 mg Nebulization BID  . carvedilol  3.125 mg Oral BID WC  . fluticasone  1 spray Each Nare QHS  . furosemide  40 mg Intravenous BID  . ipratropium-albuterol  3 mL Nebulization Q6H  . loratadine  10 mg Oral Daily  .  losartan  12.5 mg Oral BID  . methylPREDNISolone (SOLU-MEDROL) injection  40 mg Intravenous Daily  . mupirocin ointment  1 application Topical BID  . nicotine  21 mg Transdermal Daily  . potassium chloride  40 mEq Oral Once  . sodium chloride flush  3 mL Intravenous Q12H  . spironolactone  12.5 mg Oral Daily     Infusions: . azithromycin 500 mg (10/25/16 1122)  . cefTRIAXone (ROCEPHIN) 1 g IVPB 1 g (10/25/16 1123)  . heparin 1,400 Units/hr (10/24/16 1212)     PRN Medications:  acetaminophen **OR** acetaminophen, benzonatate    Patient Profile   31 recently diagnosed with PNA presented with worsening dyspnea and chest discomfort. CXR with bilateral infiltrates, elevated troponin, EF 15-20% on echo.   Assessment/Plan   1. Acute systolic CHF: Echo this admission with moderate LV dilation, EF 15-20%.  CXR with bilateral  airspace disease, may actually be all pulmonary edema rather than PNA. No family history of cardiomyopathy.  He does have history of HTN.  Urine positive for cocaine. Troponin elevated, peaked at 5.38.  Differential for cardiomyopathy = cocaine-related, CAD, viral myocarditis.  HIV negative. On exam, he is volume overloaded.  He diuresed well with IV Lasix yesterday.  - Lasix 40 mg IV bid, give dose now and replace K.  - Can continue low dose Coreg.  - Increase losartan to 12.5 mg bid.  Eventual transition to [facility].  - Add spironolactone 12.5 mg daily.  - Plan for right/left heart cath on Monday to assess filling pressure, cardiac output, and for coronary disease.  If no coronary disease, should eventually have cardiac MRI.  2. PNA: CXR may actually be reflective of pulmonary edema.  No fever or elevation in WBCs.  He is currently being covered with ceftriaxone/azithromycin.  3. Cocaine abuse: Needs to stop completely.   Length of Stay: 1  Loralie Champagne, MD  10/25/2016, 12:47 PM  Advanced Heart Failure Team Pager [phone] (M-F; 7a - 4p)  Please contact [facility] for night-coverage after hours (4p -7a ) and weekends on [email or web address]

## 2016-10-25 NOTE — Progress Notes (Signed)
ANTICOAGULATION CONSULT NOTE - Initial Consult  Pharmacy Consult for Heparin Drip Indication: chest pain/ACS  No Known Allergies  Patient Measurements: Height: 6' (182.9 cm) Weight: 280 lb (127 kg) IBW/kg (Calculated) : 77.6 Heparin Dosing Weight: 106 kg  Vital Signs: Temp: 98.6 F (37 C) (08/10 2010) Temp Source: Oral (08/10 2010) BP: 125/72 (08/10 2010) Pulse Rate: 95 (08/10 2010)  Labs:  Recent Labs  10/24/16 0954 10/24/16 1133 10/24/16 1437 10/24/16 1847 10/25/16 0106  HGB 14.6  --   --   --  14.0  HCT 42.7  --   --   --  41.0  PLT 306  --   --   --  295  APTT  --  27  --   --   --   LABPROT  --  12.7  --   --   --   INR  --  0.95  --   --   --   HEPARINUNFRC  --   --   --  0.42 0.34  CREATININE 1.14  --   --   --  1.26*  TROPONINI 3.81*  --  5.38* 4.17*  --     Estimated Creatinine Clearance: 116 mL/min (A) (by C-G formula based on SCr of 1.26 mg/dL (H)).   Medical History: Past Medical History:  Diagnosis Date  . Community acquired pneumonia    a. Bilat - dx 10/12/2016.  . Morbid obesity ([place])   . Palpitations   . Tobacco abuse     Medications:  Scheduled:  . aspirin EC  81 mg Oral Daily  . atorvastatin  80 mg Oral q1800  . budesonide (PULMICORT) nebulizer solution  0.5 mg Nebulization BID  . carvedilol  3.125 mg Oral BID WC  . fluticasone  1 spray Each Nare QHS  . ipratropium-albuterol  3 mL Nebulization Q6H  . loratadine  10 mg Oral Daily  . losartan  12.5 mg Oral Daily  . methylPREDNISolone (SOLU-MEDROL) injection  40 mg Intravenous Daily  . mupirocin ointment  1 application Topical BID  . nicotine  21 mg Transdermal Daily   Infusions:  . azithromycin    . cefTRIAXone (ROCEPHIN) 1 g IVPB    . heparin 1,400 Units/hr (10/24/16 1212)    Assessment: 31 yo M w/ CP, SOB and elevated troponin. Suspect ?myocarditis.   Hgb 14.6  Plt 306  INR 0.95  APTT 27  Goal of Therapy:  Heparin level 0.3-0.7 units/ml Monitor platelets by  anticoagulation protocol: Yes   Plan:   8/10 1847 Anti-Xa level therapeutic at 0.42. Will continue current rate of heparin 1400units/hr and recheck level in 6 hours. CBC with AM labs per protocol.   8/11 0100 heparin level 0.34. Continue current regimen. Recheck heparin level and CBC with tomorrow AM labs.   Eloise Harman, PharmD, BCPS Clinical Pharmacist 10/25/2016 4:32 AM

## 2016-10-25 NOTE — Progress Notes (Signed)
Pt was in the [employer] and has PTSD. MD notified for psych consult. I will continue to assess.

## 2016-10-25 NOTE — Progress Notes (Signed)
Attempted to show patient and SO video on congestive heart failure, however video would not play. Gave patient hand outs and extensive verbal education.

## 2016-10-26 LAB — LIPID PANEL
CHOL/HDL RATIO: 3.4 ratio
Cholesterol: 213 mg/dL — ABNORMAL HIGH (ref 0–200)
HDL: 62 mg/dL (ref >40)
LDL CALC: 130 mg/dL — AB (ref 0–99)
Triglycerides: 106 mg/dL (ref <150)
VLDL: 21 mg/dL (ref 0–40)

## 2016-10-26 LAB — CBC
HEMATOCRIT: 40.1 % (ref 40.0–52.0)
Hemoglobin: 13.5 g/dL (ref 13.0–18.0)
MCH: 30.7 pg (ref 26.0–34.0)
MCHC: 33.8 g/dL (ref 32.0–36.0)
MCV: 90.8 fL (ref 80.0–100.0)
Platelets: 281 10*3/uL (ref 150–440)
RBC: 4.42 MIL/uL (ref 4.40–5.90)
RDW: 12.6 % (ref 11.5–14.5)
WBC: 16.7 10*3/uL — ABNORMAL HIGH (ref 3.8–10.6)

## 2016-10-26 LAB — BASIC METABOLIC PANEL
Anion gap: 7 (ref 5–15)
BUN: 16 mg/dL (ref 6–20)
CALCIUM: 9.1 mg/dL (ref 8.9–10.3)
CHLORIDE: 104 mmol/L (ref 101–111)
CO2: 29 mmol/L (ref 22–32)
Creatinine, Ser: 1.12 mg/dL (ref 0.61–1.24)
GFR calc non Af Amer: >60 mL/min (ref >60)
GLUCOSE: 238 mg/dL — AB (ref 65–99)
Potassium: 3.5 mmol/L (ref 3.5–5.1)
Sodium: 140 mmol/L (ref 135–145)

## 2016-10-26 LAB — GLUCOSE, CAPILLARY
GLUCOSE-CAPILLARY: 169 mg/dL — AB (ref 65–99)
Glucose-Capillary: 155 mg/dL — ABNORMAL HIGH (ref 65–99)
Glucose-Capillary: 176 mg/dL — ABNORMAL HIGH (ref 65–99)

## 2016-10-26 LAB — MAGNESIUM: Magnesium: 2.1 mg/dL (ref 1.7–2.4)

## 2016-10-26 LAB — HEPARIN LEVEL (UNFRACTIONATED): HEPARIN UNFRACTIONATED: 0.34 [IU]/mL (ref 0.30–0.70)

## 2016-10-26 LAB — TROPONIN I: Troponin I: 1.26 ng/mL (ref <0.03)

## 2016-10-26 LAB — FIBRIN DERIVATIVES D-DIMER (ARMC ONLY): FIBRIN DERIVATIVES D-DIMER (ARMC): 227.94 (ref 0.00–499.00)

## 2016-10-26 MED ORDER — IPRATROPIUM-ALBUTEROL 0.5-2.5 (3) MG/3ML IN SOLN
3.0000 mL | Freq: Two times a day (BID) | RESPIRATORY_TRACT | Status: DC
  Administered 2016-10-26 – 2016-10-30 (×7): 3 mL via RESPIRATORY_TRACT
  Filled 2016-10-26 (×8): qty 3

## 2016-10-26 MED ORDER — SODIUM CHLORIDE 0.9 % IV SOLN: INTRAVENOUS | Status: DC

## 2016-10-26 MED ORDER — NITROGLYCERIN 0.4 MG SL SUBL
0.4000 mg | SUBLINGUAL_TABLET | SUBLINGUAL | Status: DC | PRN
  Administered 2016-10-27: 0.4 mg via SUBLINGUAL
  Filled 2016-10-26 (×2): qty 1

## 2016-10-26 MED ORDER — MORPHINE SULFATE (PF) 2 MG/ML IV SOLN
2.0000 mg | INTRAVENOUS | Status: DC | PRN
  Administered 2016-10-27: 2 mg via INTRAVENOUS
  Filled 2016-10-26 (×3): qty 1

## 2016-10-26 MED ORDER — INSULIN ASPART 100 UNIT/ML ~~LOC~~ SOLN
0.0000 [IU] | Freq: Three times a day (TID) | SUBCUTANEOUS | Status: DC
  Administered 2016-10-27 – 2016-10-28 (×4): 1 [IU] via SUBCUTANEOUS
  Filled 2016-10-26 (×5): qty 1

## 2016-10-26 MED ORDER — INSULIN ASPART 100 UNIT/ML ~~LOC~~ SOLN: 0.0000 [IU] | Freq: Every day | SUBCUTANEOUS | Status: DC

## 2016-10-26 MED ORDER — SACUBITRIL-VALSARTAN 24-26 MG PO TABS
1.0000 | ORAL_TABLET | Freq: Two times a day (BID) | ORAL | Status: DC
  Administered 2016-10-27 – 2016-10-30 (×6): 1 via ORAL
  Filled 2016-10-26 (×6): qty 1

## 2016-10-26 MED ORDER — MORPHINE SULFATE (PF) 4 MG/ML IV SOLN
4.0000 mg | INTRAVENOUS | Status: DC | PRN
  Filled 2016-10-26: qty 1

## 2016-10-26 MED ORDER — SODIUM CHLORIDE 0.9% FLUSH: 3.0000 mL | INTRAVENOUS | Status: DC | PRN

## 2016-10-26 MED ORDER — ASPIRIN 81 MG PO CHEW
81.0000 mg | CHEWABLE_TABLET | ORAL | Status: AC
  Administered 2016-10-27: 81 mg via ORAL
  Filled 2016-10-26: qty 1

## 2016-10-26 MED ORDER — OXYCODONE-ACETAMINOPHEN 5-325 MG PO TABS
1.0000 | ORAL_TABLET | Freq: Four times a day (QID) | ORAL | Status: DC | PRN
  Administered 2016-10-26 – 2016-10-27 (×2): 1 via ORAL
  Filled 2016-10-26 (×3): qty 1

## 2016-10-26 MED ORDER — FUROSEMIDE 40 MG PO TABS: 40.0000 mg | ORAL_TABLET | Freq: Every day | ORAL | Status: DC

## 2016-10-26 MED ORDER — POTASSIUM CHLORIDE CRYS ER 20 MEQ PO TBCR
40.0000 meq | EXTENDED_RELEASE_TABLET | Freq: Every day | ORAL | Status: DC
  Administered 2016-10-26 – 2016-10-28 (×3): 40 meq via ORAL
  Filled 2016-10-26 (×4): qty 2

## 2016-10-26 MED ORDER — SODIUM CHLORIDE 0.9% FLUSH
3.0000 mL | Freq: Two times a day (BID) | INTRAVENOUS | Status: DC
  Administered 2016-10-26: 3 mL via INTRAVENOUS

## 2016-10-26 MED ORDER — SODIUM CHLORIDE 0.9 % IV SOLN: 250.0000 mL | INTRAVENOUS | Status: DC | PRN

## 2016-10-26 NOTE — Progress Notes (Signed)
Patient ID: Carlos Slovak., male   DOB: 15-Nov-1985, 31 y.o.   MRN: [id number]      Subjective:    Good diuresis yesterday with IV Lasix.  Overnight, he had chest pain, non-pleuritic.  Upset because of slow nursing response.  Had morphine this morning and is chest pain-free currently.  Pain was not pleuritic.  Breathing is much better today after diuresis.   Objective:   Weight Range: 280 lb (127 kg) Body mass index is 37.97 kg/m.   Vital Signs:   Temp:  [97.4 F (36.3 C)-98 F (36.7 C)] 97.6 F (36.4 C) (08/12 1016) Pulse Rate:  [91-101] 94 (08/12 1016) Resp:  [18] 18 (08/12 0754) BP: (112-130)/(73-83) 112/73 (08/12 1016) SpO2:  [95 %-100 %] 97 % (08/12 1016) Last BM Date: 10/26/16  Weight change: Filed Weights   10/24/16 0952  Weight: 280 lb (127 kg)    Intake/Output:   Intake/Output Summary (Last 24 hours) at 10/26/16 1140 Last data filed at 10/26/16 1018  Gross per 24 hour  Intake           807.57 ml  Output             2930 ml  Net         -2122.43 ml      Physical Exam    General: NAD Neck: JVP 7-8 cm, no thyromegaly or thyroid nodule.  Lungs: Clear to auscultation bilaterally with normal respiratory effort. CV: Nondisplaced PMI.  Heart regular S1/S2, +S3, no murmur.  No peripheral edema.   Abdomen: Soft, nontender, no hepatosplenomegaly, no distention.  Skin: Intact without lesions or rashes.  Neurologic: Alert and oriented x 3.  Psych: Normal affect. Extremities: No clubbing or cyanosis.  HEENT: Normal.    Telemetry   Personally reviewed, NSR  Labs    CBC  Recent Labs  10/25/16 0106 10/26/16 0444  WBC 8.8 16.7*  HGB 14.0 13.5  HCT 41.0 40.1  MCV 89.1 90.8  PLT 295 440   Basic Metabolic Panel  Recent Labs  10/25/16 0106 10/26/16 0444  NA 139 140  K 3.7 3.5  CL 104 104  CO2 27 29  GLUCOSE 201* 238*  BUN 15 16  CREATININE 1.26* 1.12  CALCIUM 9.5 9.1  MG  --  2.1   Liver Function Tests No results for input(s): AST,  ALT, ALKPHOS, BILITOT, PROT, ALBUMIN in the last 72 hours. No results for input(s): LIPASE, AMYLASE in the last 72 hours. Cardiac Enzymes  Recent Labs  10/24/16 0954 10/24/16 1437 10/24/16 1847  TROPONINI 3.81* 5.38* 4.17*    BNP: BNP (last 3 results)  Recent Labs  10/24/16 1133  BNP 676.0*    ProBNP (last 3 results) No results for input(s): PROBNP in the last 8760 hours.   D-Dimer No results for input(s): DDIMER in the last 72 hours. Hemoglobin A1C No results for input(s): HGBA1C in the last 72 hours. Fasting Lipid Panel  Recent Labs  10/26/16 0444  CHOL 213*  HDL 62  LDLCALC 130*  TRIG 106  CHOLHDL 3.4   Thyroid Function Tests No results for input(s): TSH, T4TOTAL, T3FREE, THYROIDAB in the last 72 hours.  Invalid input(s): FREET3  Other results:   Imaging    No results found.   Medications:     Scheduled Medications: . aspirin EC  81 mg Oral Daily  . atorvastatin  80 mg Oral q1800  . budesonide (PULMICORT) nebulizer solution  0.5 mg Nebulization BID  . carvedilol  3.125 mg Oral BID WC  . fluticasone  1 spray Each Nare QHS  . [START ON 10/27/2016] furosemide  40 mg Oral Daily  . insulin aspart  0-5 Units Subcutaneous QHS  . insulin aspart  0-9 Units Subcutaneous TID WC  . ipratropium-albuterol  3 mL Nebulization TID  . loratadine  10 mg Oral Daily  . mupirocin ointment  1 application Topical BID  . nicotine  21 mg Transdermal Daily  . sacubitril-valsartan  1 tablet Oral BID  . sodium chloride flush  3 mL Intravenous Q12H  . spironolactone  12.5 mg Oral Daily    Infusions: . heparin 1,400 Units/hr (10/24/16 1212)    PRN Medications: acetaminophen **OR** acetaminophen, benzonatate, morphine injection, nitroGLYCERIN, oxyCODONE-acetaminophen    Patient Profile   31 recently diagnosed with PNA presented with worsening dyspnea and chest discomfort. CXR with bilateral infiltrates, elevated troponin, EF 15-20% on echo.   Assessment/Plan     1. Acute systolic CHF: Echo this admission with moderate LV dilation, EF 15-20%.  CXR with bilateral airspace disease, may actually be all pulmonary edema rather than PNA. No family history of cardiomyopathy.  He does have history of HTN.  Urine positive for cocaine. Troponin elevated, peaked at 5.38 => ?related to myocarditis versus coronary disease versus demand ischemia from volume overload (seems high for demand ischemia).  Differential for cardiomyopathy = cocaine-related, CAD, viral myocarditis.  HIV negative. Volume status much better on exam after IV diuresis.   - Got Lasix 40 mg IV x 1 this morning, transition to Lasix 40 mg po daily tomorrow.  - Can continue low dose Coreg.  - Stable BP, will stop losartan and start Entresto 24/26 bid.  - Continue spironolactone 12.5 mg daily.  - Plan for right/left heart cath on Monday to assess filling pressure, cardiac output, and for coronary disease.  Discussed risks/benefits, patient agrees to proceed.  If no coronary disease, should eventually have cardiac MRI.  2. PNA: CXR may have actually been reflective of pulmonary edema.  No fever or elevation in WBCs.  He is currently being covered with ceftriaxone/azithromycin.  3. Cocaine abuse: Needs to stop completely.  4. Chest pain: Had further chest pain overnight, now CP-free.  Possibly myopericarditis though cannot rule out CAD.  - D dimer sent (think PE is unlikely).  - Would repeat troponin - No acute ECG changes at admission, need to repeat ECG.  - As above, will have cath tomorrow.   Length of Stay: 2  Loralie Champagne, MD  10/26/2016, 11:40 AM  Advanced Heart Failure Team Pager [phone] (M-F; 7a - 4p)  Please contact [facility] for night-coverage after hours (4p -7a ) and weekends on [email or web address]

## 2016-10-26 NOTE — Progress Notes (Addendum)
ANTICOAGULATION CONSULT NOTE - Initial Consult  Pharmacy Consult for Heparin Drip Indication: chest pain/ACS  No Known Allergies  Patient Measurements: Height: 6' (182.9 cm) Weight: 280 lb (127 kg) IBW/kg (Calculated) : 77.6 Heparin Dosing Weight: 106 kg  Vital Signs: Temp: 98 F (36.7 C) (08/12 0510) Temp Source: Oral (08/12 0510) BP: 130/83 (08/12 0510) Pulse Rate: 95 (08/12 0510)  Labs:  Recent Labs  10/24/16 0954 10/24/16 1133 10/24/16 1437 10/24/16 1847 10/25/16 0106 10/26/16 0444  HGB 14.6  --   --   --  14.0 13.5  HCT 42.7  --   --   --  41.0 40.1  PLT 306  --   --   --  295 281  APTT  --  27  --   --   --   --   LABPROT  --  12.7  --   --   --   --   INR  --  0.95  --   --   --   --   HEPARINUNFRC  --   --   --  0.42 0.34 0.34  CREATININE 1.14  --   --   --  1.26* 1.12  TROPONINI 3.81*  --  5.38* 4.17*  --   --     Estimated Creatinine Clearance: 130.4 mL/min (by C-G formula based on SCr of 1.12 mg/dL).   Medical History: Past Medical History:  Diagnosis Date  . Community acquired pneumonia    a. Bilat - dx 10/12/2016.  . Morbid obesity ([place])   . Palpitations   . Tobacco abuse     Medications:  Scheduled:  . aspirin EC  81 mg Oral Daily  . atorvastatin  80 mg Oral q1800  . budesonide (PULMICORT) nebulizer solution  0.5 mg Nebulization BID  . carvedilol  3.125 mg Oral BID WC  . fluticasone  1 spray Each Nare QHS  . furosemide  40 mg Intravenous BID  . ipratropium-albuterol  3 mL Nebulization TID  . loratadine  10 mg Oral Daily  . losartan  12.5 mg Oral BID  . mupirocin ointment  1 application Topical BID  . nicotine  21 mg Transdermal Daily  . sodium chloride flush  3 mL Intravenous Q12H  . spironolactone  12.5 mg Oral Daily   Infusions:  . heparin 1,400 Units/hr (10/24/16 1212)    Assessment: 31 yo M w/ CP, SOB and elevated troponin. Suspect ?myocarditis.   Hgb 14.6  Plt 306  INR 0.95  APTT 27  Goal of Therapy:  Heparin level  0.3-0.7 units/ml Monitor platelets by anticoagulation protocol: Yes   Plan:   8/10 1847 Anti-Xa level therapeutic at 0.42. Will continue current rate of heparin 1400units/hr and recheck level in 6 hours. CBC with AM labs per protocol.   8/11 0100 heparin level 0.34. Continue current regimen. Recheck heparin level and CBC with tomorrow AM labs.  8/12 AM heparin level 0.34. Continue current regimen. Recheck heparin level and CBC with tomorrow AM labs.   Eloise Harman, PharmD, BCPS Clinical Pharmacist 10/26/2016 5:45 AM

## 2016-10-26 NOTE — Progress Notes (Signed)
Casemanagement notified. Pt is new HF patient and needs a home scale. CM acknowledged and will provide scale tomorrow.

## 2016-10-26 NOTE — Plan of Care (Signed)
Problem: Pain Managment: Goal: General experience of comfort will improve Outcome: Not Progressing Patient request morphine for pain 5/10, explained to patient about using appropriate pain medication for pain level.

## 2016-10-26 NOTE — Progress Notes (Addendum)
Carlos Brewer NAME: Carlos Brewer    MR#:  [id number]  DATE OF BIRTH:  1985-11-07  SUBJECTIVE:  CHIEF COMPLAINT:   Chief Complaint  Patient presents with  . Chest Pain   Still chest pain last night, no cough or shortness of breath, off O2 [place]. REVIEW OF SYSTEMS:  Review of Systems  Constitutional: Negative for chills, fever and malaise/fatigue.  HENT: Negative for sore throat.   Eyes: Negative for blurred vision and double vision.  Respiratory: Negative for cough, hemoptysis, sputum production, shortness of breath, wheezing and stridor.   Cardiovascular: Positive for chest pain. Negative for palpitations, orthopnea and leg swelling.  Gastrointestinal: Negative for abdominal pain, blood in stool, diarrhea, melena, nausea and vomiting.  Genitourinary: Negative for dysuria, flank pain and hematuria.  Musculoskeletal: Negative for back pain and joint pain.  Skin: Negative for rash.  Neurological: Negative for dizziness, sensory change, focal weakness, seizures, loss of consciousness, weakness and headaches.  Endo/Heme/Allergies: Negative for polydipsia.  Psychiatric/Behavioral: Negative for depression. The patient is not nervous/anxious.     DRUG ALLERGIES:  No Known Allergies VITALS:  Blood pressure 112/73, pulse 94, temperature 97.6 F (36.4 C), temperature source Oral, resp. rate 18, height 6' (1.829 m), weight 280 lb (127 kg), SpO2 97 %. PHYSICAL EXAMINATION:  Physical Exam  Constitutional: He is oriented to person, place, and time and well-developed, well-nourished, and in no distress.  Morbid obesity.  HENT:  Head: Normocephalic.  Mouth/Throat: Oropharynx is clear and moist.  Eyes: Pupils are equal, round, and reactive to light. Conjunctivae and EOM are normal. No scleral icterus.  Neck: Normal range of motion. Neck supple. No JVD present. No tracheal deviation present.  Cardiovascular: Normal rate, regular rhythm and  normal heart sounds.  Exam reveals no gallop.   No murmur heard. Pulmonary/Chest: Effort normal and breath sounds normal. No respiratory distress. He has no wheezes. He has no rales.  Abdominal: Soft. Bowel sounds are normal. He exhibits no distension. There is no tenderness. There is no rebound.  Musculoskeletal: Normal range of motion. He exhibits no edema or tenderness.  Neurological: He is alert and oriented to person, place, and time. No cranial nerve deficit.  Skin: No rash noted. No erythema.  Psychiatric: Affect normal.   LABORATORY PANEL:  Male CBC  Recent Labs Lab 10/26/16 0444  WBC 16.7*  HGB 13.5  HCT 40.1  PLT 281   ------------------------------------------------------------------------------------------------------------------ Chemistries   Recent Labs Lab 10/26/16 0444  NA 140  K 3.5  CL 104  CO2 29  GLUCOSE 238*  BUN 16  CREATININE 1.12  CALCIUM 9.1  MG 2.1   RADIOLOGY:  No results found. ASSESSMENT AND PLAN:    1.  Acute systolic CHF with cardiomyopathy: Echo this admission with moderate LV dilation, EF 15-20%.  Per Dr. Aundra Dubin,  [place] with bilateral airspace disease, may actually be all pulmonary edema rather than PNA.  - Lasix 40 mg IV bid, give dose now and replace K.  - Can continue low dose Coreg.  - Increased losartan to 12.5 mg bid.  Eventual transition to [facility].  - Added spironolactone 12.5 mg daily.  - Plan for right/left heart cath tomorrow.  2.  NSTEMI continue heparin drip, ASA and Lipitor. Plan for right/left heart cath on Monday.  3. Bilateral pulmonary edema, not pneumonia.  He was on outpatient Augmentin and doxycycline.  Discontinued IV Rocephin and Zithromax. Pro calcitonin are negative which is against bacterial pneumonia. Marland Kitchen  Not COPD exacerbation. Discontinued IV Solu-Medrol, continue budesonide, Continue DuoNeb nebulizers.  Leukocytosis. WBC increased today. Possible due to steroids. Follow-up CBC.  4. Tobacco abuse  smoking cessation counseling done 4 minutes. 5. Cocaine abuse. Urine toxicology showed positive cocaine.  Per psych consult,  Dr. Einar Grad, he need to call the substance abuse program at the [facility] once he is discharged. Number to call [phone]  Hyperlipidemia. Continue Lipitor. LDL 131. Morbid obesity. Check lipid panel and hemoglobin A1c.  PTSD. Consider starting him on Zoloft at 25 mg once daily and trazodone 50 mg at bedtime to help him sleep if medically okay per psych consult.  All the records are reviewed and case discussed with Care Management/Social Worker. Management plans discussed with the patient, family and they are in agreement.  CODE STATUS: Full Code  TOTAL TIME TAKING CARE OF THIS PATIENT: 42 minutes.   More than 50% of the time was spent in counseling/coordination of care: YES  POSSIBLE D/C IN 3 DAYS, DEPENDING ON CLINICAL CONDITION.   Demetrios Loll M.D on 10/26/2016 at 1:08 PM  Between 7am to 6pm - Pager - [phone]  After 6pm go to [email or web address] - Proofreader  [facility]  Office  [phone]  CC: Primary care physician; Patient, No Pcp Per  Note: This dictation was prepared with Dragon dictation along with smaller phrase technology. Any transcriptional errors that result from this process are unintentional.

## 2016-10-26 NOTE — Consult Note (Signed)
[facility] Consult   Reason for Consult:  PTSD, Cocaine abuse Referring Physician:  Dr.Chen Patient Identification: Carlos Brewer. MRN:  [id number] Principal Diagnosis: <principal problem not specified> Diagnosis:   Patient Active Problem List   Diagnosis Date Noted  . Pneumonia [J18.9] 10/24/2016    Total Time spent with patient: 1 hour  Subjective:   Carlos Brewer. is a 31 y.o. male patient admitted with Chest pain and a history of PTSD and cocaine abuse.  HPI:  Patient is a 31 year old African-American male who was admitted with the tightness in his chest and having severe chest pain. He was initially thought to have pneumonia and is being worked up for other cardiac issues as well. A consult was called to assess patient for his PTSD and ongoing cocaine abuse. Patient is pleasant and cooperative. He wanted to speak alone with this clinician. Patient did endorse that he was in the [place] for 4 years and was exposed to combat during that time. He also endorsed that he had a severe alcohol problem at that time and was in rehabilitation. Currently he reports to using the cocaine for about 4 years and over the past year using cocaine through IV. 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 States that he stopped using for a bit when his girlfriend was pregnant. He started using back again after she had a miscarriage. He does endorse significant anxiety and having constant thoughts about the combat and intrusive thoughts about the combat while he was in [place]. He denies any suicidal thoughts. Denies any homicidal thoughts. He  denies any visual or auditory hallucinations. We discussed the importance of him getting help for his cocaine abuse and patient is agreeable to this. States that he did contact the [place] but they given him an appointment 2 months down the line in September. He also reports he was prescribed Wellbutrin in the past and  it did not do anything for him.  Past Psychiatric History:   Risk to Self: Is patient at risk for suicide?: No Risk to Others:   Prior Inpatient Therapy:  none Prior Outpatient Therapy:  none per patient  Past Medical History:  Past Medical History:  Diagnosis Date  . Community acquired pneumonia    a. Bilat - dx 10/12/2016.  . Morbid obesity ([place])   . Palpitations   . Tobacco abuse     Past Surgical History:  Procedure Laterality Date  . WISDOM TOOTH EXTRACTION  2008   Family History:  Family History  Problem Relation Age of Onset  . Healthy Mother   . Healthy Father    Family Psychiatric  History: Denies Social History:  History  Alcohol Use No    Comment: none     History  Drug Use No    Comment: Used cocaine, stopped 03/2016    Social History   Social History  . Marital status: Single    Spouse name: N/A  . Number of children: N/A  . Years of education: N/A   Social History Main Topics  . Smoking status: Current Every Day Smoker    Packs/day: 0.25    Types: Cigarettes  . Smokeless tobacco: Never Used     Comment: about 5 cigarettes day.    . Alcohol use No     Comment: none  . Drug use: No     Comment: Used cocaine, stopped 03/2016  . Sexual activity: Yes   Other  Topics Concern  . None   Social History Narrative   Lives in [place] with his spouse.  He does not routinely exercise.   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 VALUE IS CONSISTENT WITH PREVIOUSLY  REPORTED/CALLED VALUE JJB  Procalcitonin - Baseline     Status: None   Collection Time: 10/24/16  2:37 PM  Result Value Ref Range   Procalcitonin <0.10 ng/mL    Comment:        Interpretation: PCT (Procalcitonin) <= 0.5 ng/mL: Systemic infection (sepsis) is not likely. Local bacterial infection is possible. (NOTE)         ICU PCT Algorithm               Non ICU PCT Algorithm    ----------------------------     ------------------------------         PCT < 0.25 ng/mL                 PCT < 0.1 ng/mL     Stopping of antibiotics            Stopping of antibiotics       strongly encouraged.               strongly encouraged.    ----------------------------     ------------------------------       PCT level decrease by               PCT < 0.25 ng/mL       >= 80% from peak PCT       OR PCT 0.25 - 0.5 ng/mL          Stopping of antibiotics                                             encouraged.     Stopping of antibiotics           encouraged.    ----------------------------     ------------------------------       PCT level decrease by              PCT >= 0.25 ng/mL       < 80% from peak PCT        AND PCT >= 0.5 ng/mL            Continuin g antibiotics                                              encouraged.       Continuing antibiotics            encouraged.    ----------------------------     ------------------------------  PCT level increase compared          PCT > 0.5 ng/mL         with peak PCT AND          PCT >= 0.5 ng/mL             Escalation of antibiotics                                          strongly encouraged.      Escalation of antibiotics        strongly encouraged.   HIV antibody (Routine Testing)     Status: None   Collection Time: 10/24/16  2:37 PM  Result Value Ref Range   HIV Screen 4th Generation wRfx Non Reactive Non Reactive    Comment: (NOTE) Performed At: [facility] [address] [place], [place] [id number] Lindon Romp MD  PT:[id number]   Urine Drug Screen, Qualitative ([place] only)     Status: Abnormal   Collection Time: 10/24/16  5:37 PM  Result Value Ref Range   Tricyclic, Ur Screen NONE DETECTED NONE DETECTED   Amphetamines, Ur Screen NONE DETECTED NONE DETECTED   MDMA (Ecstasy)Ur Screen NONE DETECTED NONE DETECTED   Cocaine Metabolite,Ur [place] POSITIVE (A) NONE DETECTED   Opiate, Ur Screen NONE DETECTED NONE DETECTED   Phencyclidine (PCP) Ur S NONE DETECTED NONE DETECTED   Cannabinoid 50 Ng, Ur [place] NONE DETECTED NONE DETECTED   Barbiturates, Ur Screen NONE DETECTED NONE DETECTED   Benzodiazepine, Ur Scrn NONE DETECTED NONE DETECTED   Methadone Scn, Ur NONE DETECTED NONE DETECTED    Comment: (NOTE) 353  Tricyclics, urine               Cutoff 1000 ng/mL 200  Amphetamines, urine             Cutoff 1000 ng/mL 300  MDMA (Ecstasy), urine           Cutoff 500 ng/mL 400  Cocaine Metabolite, urine       Cutoff 300 ng/mL 500  Opiate, urine                   Cutoff 300 ng/mL 600  Phencyclidine (PCP), urine      Cutoff 25 ng/mL 700  Cannabinoid, urine              Cutoff 50 ng/mL 800  Barbiturates, urine             Cutoff 200 ng/mL 900  Benzodiazepine, urine           Cutoff 200 ng/mL 1000 Methadone, urine                Cutoff 300 ng/mL 1100 1200 The urine drug screen provides only a preliminary, unconfirmed 1300 analytical test result and should not be used for non-medical 1400 purposes. Clinical consideration and professional judgment should 1500 be applied to any positive drug screen result due to possible 1600 interfering substances. A more specific alternate chemical method 1700 must be used in order to obtain a confirmed analytical result.  1800 Gas chromato graphy / mass spectrometry (GC/MS) is the preferred 1900 confirmatory method.   Heparin level (unfractionated)     Status: None   Collection Time: 10/24/16  6:47 PM  Result Value Ref Range   Heparin Unfractionated 0.42 0.30 - 0.70 IU/mL  Comment:        IF HEPARIN RESULTS ARE BELOW EXPECTED VALUES, AND PATIENT DOSAGE HAS BEEN CONFIRMED, SUGGEST FOLLOW UP TESTING OF ANTITHROMBIN III LEVELS.   Troponin I     Status: Abnormal   Collection Time: 10/24/16  6:47 PM  Result Value Ref Range   Troponin I 4.17 (HH) <0.03 ng/mL    Comment: CRITICAL VALUE NOTED. VALUE IS CONSISTENT WITH PREVIOUSLY REPORTED/CALLED VALUE JJB  CBC     Status: None   Collection Time: 10/25/16  1:06 AM  Result Value Ref Range   WBC 8.8 3.8 - 10.6 K/uL   RBC 4.60 4.40 - 5.90 MIL/uL   Hemoglobin 14.0 13.0 - 18.0 g/dL   HCT 41.0 40.0 - 52.0 %   MCV 89.1 80.0 - 100.0 fL   MCH 30.5 26.0 - 34.0 pg   MCHC 34.2 32.0 - 36.0 g/dL   RDW 13.0 11.5 - 14.5 %   Platelets 295 150 - 440 K/uL  Basic metabolic panel     Status: Abnormal   Collection Time: 10/25/16  1:06 AM  Result Value Ref Range   Sodium 139 135 - 145 mmol/L   Potassium 3.7 3.5 - 5.1 mmol/L   Chloride 104 101 - 111 mmol/L   CO2 27 22 - 32 mmol/L   Glucose, Bld 201 (H) 65 - 99 mg/dL   BUN 15 6 - 20 mg/dL   Creatinine, Ser 1.26 (H) 0.61 - 1.24 mg/dL   Calcium 9.5 8.9 - 10.3 mg/dL   GFR calc non Af Amer >60 >60 mL/min   GFR calc Af Amer >60 >60 mL/min    Comment: (NOTE) The eGFR has been calculated using the CKD EPI equation. This calculation has not been validated in all clinical situations. eGFR's persistently <60 mL/min signify possible Chronic Kidney Disease.    Anion gap 8 5 - 15  Heparin level (unfractionated)     Status: None   Collection Time: 10/25/16  1:06 AM  Result Value Ref Range   Heparin Unfractionated 0.34 0.30 - 0.70 IU/mL    Comment:        IF HEPARIN RESULTS ARE BELOW EXPECTED VALUES, AND PATIENT DOSAGE HAS BEEN CONFIRMED, SUGGEST FOLLOW UP TESTING OF ANTITHROMBIN III LEVELS.   Heparin level (unfractionated)     Status: None   Collection Time: 10/26/16  4:44 AM  Result Value Ref Range   Heparin Unfractionated 0.34 0.30 - 0.70 IU/mL    Comment:        IF  HEPARIN RESULTS ARE BELOW EXPECTED VALUES, AND PATIENT DOSAGE HAS BEEN CONFIRMED, SUGGEST FOLLOW UP TESTING OF ANTITHROMBIN III LEVELS.   CBC     Status: Abnormal   Collection Time: 10/26/16  4:44 AM  Result Value Ref Range   WBC 16.7 (H) 3.8 - 10.6 K/uL   RBC 4.42 4.40 - 5.90 MIL/uL   Hemoglobin 13.5 13.0 - 18.0 g/dL   HCT 40.1 40.0 - 52.0 %   MCV 90.8 80.0 - 100.0 fL   MCH 30.7 26.0 - 34.0 pg   MCHC 33.8 32.0 - 36.0 g/dL   RDW 12.6 11.5 - 14.5 %   Platelets 281 150 - 440 K/uL  Basic metabolic panel     Status: Abnormal   Collection Time: 10/26/16  4:44 AM  Result Value Ref Range   Sodium 140 135 - 145 mmol/L   Potassium 3.5 3.5 - 5.1 mmol/L   Chloride 104 101 - 111 mmol/L   CO2 29 22 - 32  mmol/L   Glucose, Bld 238 (H) 65 - 99 mg/dL   BUN 16 6 - 20 mg/dL   Creatinine, Ser 1.12 0.61 - 1.24 mg/dL   Calcium 9.1 8.9 - 10.3 mg/dL   GFR calc non Af Amer >60 >60 mL/min   GFR calc Af Amer >60 >60 mL/min    Comment: (NOTE) The eGFR has been calculated using the CKD EPI equation. This calculation has not been validated in all clinical situations. eGFR's persistently <60 mL/min signify possible Chronic Kidney Disease.    Anion gap 7 5 - 15  Magnesium     Status: None   Collection Time: 10/26/16  4:44 AM  Result Value Ref Range   Magnesium 2.1 1.7 - 2.4 mg/dL  Lipid panel     Status: Abnormal   Collection Time: 10/26/16  4:44 AM  Result Value Ref Range   Cholesterol 213 (H) 0 - 200 mg/dL   Triglycerides 106 <150 mg/dL   HDL 62 >40 mg/dL   Total CHOL/HDL Ratio 3.4 RATIO   VLDL 21 0 - 40 mg/dL   LDL Cholesterol 130 (H) 0 - 99 mg/dL    Comment:        Total Cholesterol/HDL:CHD Risk Coronary Heart Disease Risk Table                     Men   Women  1/2 Average Risk   3.4   3.3  Average Risk       5.0   4.4  2 X Average Risk   9.6   7.1  3 X Average Risk  23.4   11.0        Use the calculated Patient Ratio above and the CHD Risk Table to determine the patient's CHD  Risk.        ATP III CLASSIFICATION (LDL):  <100     mg/dL   Optimal  100-129  mg/dL   Near or Above                    Optimal  130-159  mg/dL   Borderline  160-189  mg/dL   High  >190     mg/dL   Very High   Troponin I     Status: Abnormal   Collection Time: 10/26/16  4:44 AM  Result Value Ref Range   Troponin I 1.26 (HH) <0.03 ng/mL    Comment: CRITICAL VALUE NOTED. VALUE IS CONSISTENT WITH PREVIOUSLY REPORTED/CALLED VALUE  SDR   Fibrin derivatives D-Dimer ([place] only)     Status: None   Collection Time: 10/26/16 10:46 AM  Result Value Ref Range   Fibrin derivatives D-dimer ([facility]) 227.94 0.00 - 499.00    Comment: (NOTE) <> Exclusion of Venous Thromboembolism (VTE) - OUTPATIENT ONLY   (Emergency Department or [place])   0-499 ng/ml (FEU): With a low to intermediate pretest probability                      for VTE this test result excludes the diagnosis                      of VTE.   >499 ng/ml (FEU) : VTE not excluded; additional work up for VTE is                      required. <> Testing on Inpatients and Evaluation of Disseminated Intravascular   Coagulation (DIC) Reference Range:  0-499 ng/ml (FEU)   Glucose, capillary     Status: Abnormal   Collection Time: 10/26/16 12:21 PM  Result Value Ref Range   Glucose-Capillary 169 (H) 65 - 99 mg/dL    Current Facility-Administered Medications  Medication Dose Route Frequency Provider Last Rate Last Dose  . acetaminophen (TYLENOL) tablet 650 mg  650 mg Oral Q6H PRN Loletha Grayer, MD       Or  . acetaminophen (TYLENOL) suppository 650 mg  650 mg Rectal Q6H PRN Loletha Grayer, MD      . aspirin EC tablet 81 mg  81 mg Oral Daily Loletha Grayer, MD   81 mg at 10/26/16 1014  . atorvastatin (LIPITOR) tablet 80 mg  80 mg Oral q1800 Rogelia Mire, NP   80 mg at 10/25/16 1816  . benzonatate (TESSALON) capsule 100 mg  100 mg Oral TID PRN Loletha Grayer, MD      . budesonide (PULMICORT) nebulizer solution 0.5 mg  0.5 mg  Nebulization BID Loletha Grayer, MD   0.5 mg at 10/26/16 0747  . carvedilol (COREG) tablet 3.125 mg  3.125 mg Oral BID WC Murray Hodgkins R, NP   3.125 mg at 10/26/16 1014  . fluticasone (FLONASE) 50 MCG/ACT nasal spray 1 spray  1 spray Each Nare QHS Loletha Grayer, MD   1 spray at 10/25/16 2107  . [START ON 10/27/2016] furosemide (LASIX) tablet 40 mg  40 mg Oral Daily Larey Dresser, MD      . heparin ADULT infusion 100 units/mL (25000 units/234m sodium chloride 0.45%)  1,400 Units/hr Intravenous Continuous KLavonia Drafts MD 14 mL/hr at 10/24/16 1212 1,400 Units/hr at 10/24/16 1212  . insulin aspart (novoLOG) injection 0-5 Units  0-5 Units Subcutaneous QHS CDemetrios Loll MD      . insulin aspart (novoLOG) injection 0-9 Units  0-9 Units Subcutaneous TID WC CDemetrios Loll MD      . ipratropium-albuterol (DUONEB) 0.5-2.5 (3) MG/3ML nebulizer solution 3 mL  3 mL Nebulization TID WLoletha Grayer MD   3 mL at 10/26/16 0747  . loratadine (CLARITIN) tablet 10 mg  10 mg Oral Daily WLoletha Grayer MD   10 mg at 10/26/16 1014  . morphine 2 MG/ML injection 2 mg  2 mg Intravenous Q4H PRN CDemetrios Loll MD      . mupirocin ointment (BACTROBAN) 2 % 1 application  1 application Topical BID WLoletha Grayer MD   1 application at [id number]  . nicotine (NICODERM CQ - dosed in mg/24 hours) patch 21 mg  21 mg Transdermal Daily DHarrie Foreman MD   21 mg at 10/26/16 1016  . nitroGLYCERIN (NITROSTAT) SL tablet 0.4 mg  0.4 mg Sublingual Q5 min PRN CDemetrios Loll MD      . oxyCODONE-acetaminophen (PERCOCET/ROXICET) 5-325 MG per tablet 1 tablet  1 tablet Oral Q6H PRN CDemetrios Loll MD      . [Derrill MemoON 10/27/2016] sacubitril-valsartan (ENTRESTO) 24-26 mg per tablet  1 tablet Oral BID MLarey Dresser MD      . sodium chloride flush (NS) 0.9 % injection 3 mL  3 mL Intravenous Q12H WLoletha Grayer MD   3 mL at 10/26/16 1018  . spironolactone (ALDACTONE) tablet 12.5 mg  12.5 mg Oral Daily MLarey Dresser MD   12.5 mg  at 10/26/16 1013    Musculoskeletal: Strength & Muscle Tone: within normal limits Gait & Station: normal Patient leans: N/A  Psychiatric Specialty Exam: Physical Exam  ROS  Blood pressure 112/73, pulse 94, temperature  97.6 F (36.4 C), temperature source Oral, resp. rate 18, height 6' (1.829 m), weight 280 lb (127 kg), SpO2 97 %.Body mass index is 37.97 kg/m.  General Appearance: Casual  Eye Contact:  Fair  Speech:  Clear and Coherent  Volume:  Decreased  Mood:  Anxious and Depressed  Affect:  Constricted  Thought Process:  Coherent  Orientation:  Full (Time, Place, and Person)  Thought Content:  WDL  Suicidal Thoughts:  No  Homicidal Thoughts:  No  Memory:  Immediate;   Fair Recent;   Fair Remote;   Fair  Judgement:  Impaired  Insight:  Shallow  Psychomotor Activity:  NA and Normal  Concentration:  Concentration: Fair and Attention Span: Fair  Recall:  [employer] of Knowledge:  Fair  Language:  Fair  Akathisia:  No  Handed:  Right  AIMS (if indicated):     Assets:  Communication Skills Desire for Improvement Financial Resources/Insurance Housing Social Support Vocational/Educational  ADL's:  Intact  Cognition:  WNL  Sleep:   poor     Treatment Plan Summary:Patient is a 31 year old Afro-American male exposed to combat during his time in [place] when he was in [employer] and subsequently having developed PTSD and anxiety. Per patient he started abusing cocaine to cope with his symptoms. Currently cocaine abuse is significant and has led to his medical problems as well. Discussed with patient the importance of obtaining treatment and told him to call the substance abuse program at the [facility] once he is discharged. Number to call [phone]  Discussed with him the importance of going to an intensive outpatient program or residential program to help with the cocaine addiction. Consider starting him on Zoloft at 25 mg once daily and trazodone 50 mg at  bedtime to help him sleep if medically okay. Thanks for the consult and I will sign off this consult for now and if needed please consult with the on-call physician tomorrow.  Disposition: No evidence of imminent risk to self or others at present.   Patient does not meet criteria for psychiatric inpatient admission. Supportive therapy provided about ongoing stressors. Refer to IOP. Discussed crisis plan, support from social network, calling 911, coming to the Emergency Department, and calling Suicide Hotline.  Elvin So, MD 10/26/2016 12:42 PM

## 2016-10-26 NOTE — Progress Notes (Signed)
Pt complains of left side chest pain 7/10. Pt also complains of pain with inspiration. MD notified, RN suggest d-dimer check and protocol orders for chest pain. RN will await orders and continue to assess.

## 2016-10-27 ENCOUNTER — Encounter
Admission: EM | Disposition: A | Discharge status: Home / Self Care | Payer: Self-pay | Source: Home / Self Care | Attending: Internal Medicine

## 2016-10-27 ENCOUNTER — Inpatient Hospital Stay: Payer: Commercial Managed Care - PPO

## 2016-10-27 ENCOUNTER — Encounter: Payer: Self-pay | Admitting: Cardiovascular Disease

## 2016-10-27 DIAGNOSIS — I5021 Acute systolic (congestive) heart failure: Secondary | ICD-10-CM

## 2016-10-27 HISTORY — PX: RIGHT/LEFT HEART CATH AND CORONARY ANGIOGRAPHY: CATH118266

## 2016-10-27 LAB — MISC LABCORP TEST (SEND OUT): Labcorp test code: 1453

## 2016-10-27 LAB — BASIC METABOLIC PANEL
Anion gap: 9 (ref 5–15)
BUN: 20 mg/dL (ref 6–20)
CHLORIDE: 104 mmol/L (ref 101–111)
CO2: 26 mmol/L (ref 22–32)
CREATININE: 1.09 mg/dL (ref 0.61–1.24)
Calcium: 9.3 mg/dL (ref 8.9–10.3)
GFR calc non Af Amer: >60 mL/min (ref >60)
GLUCOSE: 115 mg/dL — AB (ref 65–99)
Potassium: 3.7 mmol/L (ref 3.5–5.1)
Sodium: 139 mmol/L (ref 135–145)

## 2016-10-27 LAB — CBC
HEMATOCRIT: 43.5 % (ref 40.0–52.0)
Hemoglobin: 14.6 g/dL (ref 13.0–18.0)
MCH: 30.2 pg (ref 26.0–34.0)
MCHC: 33.6 g/dL (ref 32.0–36.0)
MCV: 89.9 fL (ref 80.0–100.0)
Platelets: 298 10*3/uL (ref 150–440)
RBC: 4.84 MIL/uL (ref 4.40–5.90)
RDW: 13 % (ref 11.5–14.5)
WBC: 16.9 10*3/uL — AB (ref 3.8–10.6)

## 2016-10-27 LAB — GLUCOSE, CAPILLARY
GLUCOSE-CAPILLARY: 122 mg/dL — AB (ref 65–99)
Glucose-Capillary: 128 mg/dL — ABNORMAL HIGH (ref 65–99)
Glucose-Capillary: 170 mg/dL — ABNORMAL HIGH (ref 65–99)

## 2016-10-27 LAB — HEPARIN LEVEL (UNFRACTIONATED): Heparin Unfractionated: 0.35 IU/mL (ref 0.30–0.70)

## 2016-10-27 SURGERY — RIGHT/LEFT HEART CATH AND CORONARY ANGIOGRAPHY
Anesthesia: Moderate Sedation

## 2016-10-27 MED ORDER — IOPAMIDOL (ISOVUE-300) INJECTION 61%
INTRAVENOUS | Status: DC | PRN
  Administered 2016-10-27: 60 mL via INTRA_ARTERIAL

## 2016-10-27 MED ORDER — SODIUM CHLORIDE 0.9 % IV SOLN: 250.0000 mL | INTRAVENOUS | Status: DC | PRN

## 2016-10-27 MED ORDER — SODIUM CHLORIDE 0.9 % IV SOLN: INTRAVENOUS | Status: DC

## 2016-10-27 MED ORDER — MIDAZOLAM HCL 2 MG/2ML IJ SOLN
INTRAMUSCULAR | Status: AC
  Filled 2016-10-27: qty 2

## 2016-10-27 MED ORDER — HEPARIN (PORCINE) IN NACL 2-0.9 UNIT/ML-% IJ SOLN
INTRAMUSCULAR | Status: AC
  Filled 2016-10-27: qty 1000

## 2016-10-27 MED ORDER — SODIUM CHLORIDE 0.9% FLUSH
3.0000 mL | Freq: Two times a day (BID) | INTRAVENOUS | Status: DC
  Administered 2016-10-27 – 2016-10-30 (×6): 3 mL via INTRAVENOUS

## 2016-10-27 MED ORDER — SODIUM CHLORIDE 0.9% FLUSH: 3.0000 mL | Freq: Two times a day (BID) | INTRAVENOUS | Status: DC

## 2016-10-27 MED ORDER — LIDOCAINE HCL (PF) 1 % IJ SOLN
INTRAMUSCULAR | Status: AC
  Filled 2016-10-27: qty 30

## 2016-10-27 MED ORDER — OXYCODONE-ACETAMINOPHEN 5-325 MG PO TABS
1.0000 | ORAL_TABLET | Freq: Four times a day (QID) | ORAL | Status: DC | PRN
  Filled 2016-10-27: qty 2

## 2016-10-27 MED ORDER — TRAZODONE HCL 50 MG PO TABS
50.0000 mg | ORAL_TABLET | Freq: Every day | ORAL | Status: DC
  Administered 2016-10-27 – 2016-10-29 (×3): 50 mg via ORAL
  Filled 2016-10-27 (×3): qty 1

## 2016-10-27 MED ORDER — ALUM & MAG HYDROXIDE-SIMETH 200-200-20 MG/5ML PO SUSP
30.0000 mL | Freq: Four times a day (QID) | ORAL | Status: DC | PRN
  Administered 2016-10-27: 30 mL via ORAL
  Filled 2016-10-27: qty 30

## 2016-10-27 MED ORDER — HEPARIN SODIUM (PORCINE) 1000 UNIT/ML IJ SOLN
INTRAMUSCULAR | Status: DC | PRN
  Administered 2016-10-27: 6000 [IU] via INTRAVENOUS

## 2016-10-27 MED ORDER — SODIUM CHLORIDE 0.9% FLUSH: 3.0000 mL | INTRAVENOUS | Status: DC | PRN

## 2016-10-27 MED ORDER — MIDAZOLAM HCL 2 MG/2ML IJ SOLN
INTRAMUSCULAR | Status: DC | PRN
  Administered 2016-10-27: 1 mg via INTRAVENOUS

## 2016-10-27 MED ORDER — FENTANYL CITRATE (PF) 100 MCG/2ML IJ SOLN
INTRAMUSCULAR | Status: AC
  Filled 2016-10-27: qty 2

## 2016-10-27 MED ORDER — VERAPAMIL HCL 2.5 MG/ML IV SOLN
INTRAVENOUS | Status: AC
  Filled 2016-10-27: qty 2

## 2016-10-27 MED ORDER — FENTANYL CITRATE (PF) 100 MCG/2ML IJ SOLN
INTRAMUSCULAR | Status: DC | PRN
  Administered 2016-10-27 (×2): 50 ug via INTRAVENOUS

## 2016-10-27 MED ORDER — FUROSEMIDE 10 MG/ML IJ SOLN
40.0000 mg | Freq: Two times a day (BID) | INTRAMUSCULAR | Status: DC
  Administered 2016-10-27 – 2016-10-29 (×4): 40 mg via INTRAVENOUS
  Filled 2016-10-27 (×4): qty 4

## 2016-10-27 MED ORDER — SERTRALINE HCL 50 MG PO TABS
25.0000 mg | ORAL_TABLET | Freq: Every day | ORAL | Status: DC
  Administered 2016-10-27 – 2016-10-30 (×4): 25 mg via ORAL
  Filled 2016-10-27 (×4): qty 1

## 2016-10-27 MED ORDER — ENOXAPARIN SODIUM 40 MG/0.4ML ~~LOC~~ SOLN
40.0000 mg | SUBCUTANEOUS | Status: DC
  Administered 2016-10-28 – 2016-10-30 (×3): 40 mg via SUBCUTANEOUS
  Filled 2016-10-27 (×3): qty 0.4

## 2016-10-27 MED ORDER — HEPARIN SODIUM (PORCINE) 1000 UNIT/ML IJ SOLN
INTRAMUSCULAR | Status: AC
  Filled 2016-10-27: qty 1

## 2016-10-27 SURGICAL SUPPLY — 10 items
CATH BALLN WEDGE 5F 110CM (CATHETERS) ×2 IMPLANT
CATH OPTITORQUE JACKY 4.0 5F (CATHETERS) ×2 IMPLANT
DEVICE RAD COMP TR BAND LRG (VASCULAR PRODUCTS) ×2 IMPLANT
GLIDESHEATH SLEND SS 6F .021 (SHEATH) ×2 IMPLANT
GUIDEWIRE .025 260CM (WIRE) ×2 IMPLANT
KIT MANI 3VAL PERCEP (MISCELLANEOUS) ×2 IMPLANT
KIT RIGHT HEART (MISCELLANEOUS) ×2 IMPLANT
PACK CARDIAC CATH (CUSTOM PROCEDURE TRAY) ×2 IMPLANT
SHEATH GLIDE SLENDER 4/5FR (SHEATH) ×2 IMPLANT
WIRE ROSEN-J .035X260CM (WIRE) ×2 IMPLANT

## 2016-10-27 NOTE — Progress Notes (Signed)
ANTICOAGULATION CONSULT NOTE - Initial Consult  Pharmacy Consult for Heparin Drip Indication: chest pain/ACS  No Known Allergies  Patient Measurements: Height: 6' (182.9 cm) Weight: 283 lb 3.2 oz (128.5 kg) IBW/kg (Calculated) : 77.6 Heparin Dosing Weight: 106 kg  Vital Signs: Temp: 97.8 F (36.6 C) (08/13 0337) Temp Source: Oral (08/13 0337) BP: 113/53 (08/13 0337) Pulse Rate: 79 (08/13 0337)  Labs:  Recent Labs  10/24/16 1133 10/24/16 1437  10/24/16 1847 10/25/16 0106 10/26/16 0444 10/27/16 0455  HGB  --   --   --   --  14.0 13.5 14.6  HCT  --   --   --   --  41.0 40.1 43.5  PLT  --   --   --   --  295 281 298  APTT 27  --   --   --   --   --   --   LABPROT 12.7  --   --   --   --   --   --   INR 0.95  --   --   --   --   --   --   HEPARINUNFRC  --   --   < > 0.42 0.34 0.34 0.35  CREATININE  --   --   --   --  1.26* 1.12 1.09  TROPONINI  --  5.38*  --  4.17*  --  1.26*  --   < > = values in this interval not displayed.  Estimated Creatinine Clearance: 134.9 mL/min (by C-G formula based on SCr of 1.09 mg/dL).   Medical History: Past Medical History:  Diagnosis Date  . Community acquired pneumonia    a. Bilat - dx 10/12/2016.  . Morbid obesity ([place])   . Palpitations   . Tobacco abuse     Medications:  Scheduled:  . aspirin EC  81 mg Oral Daily  . atorvastatin  80 mg Oral q1800  . budesonide (PULMICORT) nebulizer solution  0.5 mg Nebulization BID  . carvedilol  3.125 mg Oral BID WC  . fluticasone  1 spray Each Nare QHS  . furosemide  40 mg Oral Daily  . insulin aspart  0-5 Units Subcutaneous QHS  . insulin aspart  0-9 Units Subcutaneous TID WC  . ipratropium-albuterol  3 mL Nebulization BID  . loratadine  10 mg Oral Daily  . mupirocin ointment  1 application Topical BID  . nicotine  21 mg Transdermal Daily  . potassium chloride  40 mEq Oral Daily  . sacubitril-valsartan  1 tablet Oral BID  . sodium chloride flush  3 mL Intravenous Q12H  . sodium  chloride flush  3 mL Intravenous Q12H  . spironolactone  12.5 mg Oral Daily   Infusions:  . sodium chloride    . sodium chloride    . heparin 1,400 Units/hr (10/26/16 1534)    Assessment: 31 yo M w/ CP, SOB and elevated troponin. Suspect ?myocarditis.   Hgb 14.6  Plt 306  INR 0.95  APTT 27  Goal of Therapy:  Heparin level 0.3-0.7 units/ml Monitor platelets by anticoagulation protocol: Yes   Plan:   8/10 1847 Anti-Xa level therapeutic at 0.42. Will continue current rate of heparin 1400units/hr and recheck level in 6 hours. CBC with AM labs per protocol.   8/11 0100 heparin level 0.34. Continue current regimen. Recheck heparin level and CBC with tomorrow AM labs.  8/12 AM heparin level 0.34. Continue current regimen. Recheck heparin level and CBC with tomorrow  AM labs.  8/13 AM heparin level 0.35. Continue current regimen. Recheck heparin level and CBC with tomorrow AM labs.   Eloise Harman, PharmD, BCPS Clinical Pharmacist 10/27/2016 6:26 AM

## 2016-10-27 NOTE — Progress Notes (Signed)
Patient off the floor to specials

## 2016-10-27 NOTE — Progress Notes (Signed)
Patient complains of chest pain 6/10 after receiving sublingual nitroglycerin, maalox and morphine. Notified Dr.Gollan about current condition. Dr.Gollan increased dosage of percocet and frequency. Will notify rounding physician to discus causes of recurrent chest pain.

## 2016-10-27 NOTE — Progress Notes (Signed)
[facility] [place] NAME: Carlos Brewer    MR#:  [id number]  DATE OF BIRTH:  1985/09/28  SUBJECTIVE:  CHIEF COMPLAINT:   Chief Complaint  Patient presents with  . Chest Pain   No chest pain, cough or shortness of breath. REVIEW OF SYSTEMS:  Review of Systems  Constitutional: Negative for chills, fever and malaise/fatigue.  HENT: Negative for sore throat.   Eyes: Negative for blurred vision and double vision.  Respiratory: Negative for cough, hemoptysis, sputum production, shortness of breath, wheezing and stridor.   Cardiovascular: Negative for chest pain, palpitations, orthopnea and leg swelling.  Gastrointestinal: Negative for abdominal pain, blood in stool, diarrhea, melena, nausea and vomiting.  Genitourinary: Negative for dysuria, flank pain and hematuria.  Musculoskeletal: Negative for back pain and joint pain.  Skin: Negative for rash.  Neurological: Negative for dizziness, sensory change, focal weakness, seizures, loss of consciousness, weakness and headaches.  Endo/Heme/Allergies: Negative for polydipsia.  Psychiatric/Behavioral: Negative for depression. The patient is not nervous/anxious.     DRUG ALLERGIES:  No Known Allergies VITALS:  Blood pressure (!) 97/44, pulse 97, temperature 98.2 F (36.8 C), temperature source Oral, resp. rate 20, height 6' (1.829 m), weight 283 lb (128.4 kg), SpO2 95 %. PHYSICAL EXAMINATION:  Physical Exam  Constitutional: He is oriented to person, place, and time and well-developed, well-nourished, and in no distress.  Morbid obesity.  HENT:  Head: Normocephalic.  Mouth/Throat: Oropharynx is clear and moist.  Eyes: Pupils are equal, round, and reactive to light. Conjunctivae and EOM are normal. No scleral icterus.  Neck: Normal range of motion. Neck supple. No JVD present. No tracheal deviation present.  Cardiovascular: Normal rate, regular rhythm and normal heart sounds.  Exam reveals  no gallop.   No murmur heard. Pulmonary/Chest: Effort normal and breath sounds normal. No respiratory distress. He has no wheezes. He has no rales.  Abdominal: Soft. Bowel sounds are normal. He exhibits no distension. There is no tenderness. There is no rebound.  Musculoskeletal: Normal range of motion. He exhibits no edema or tenderness.  Neurological: He is alert and oriented to person, place, and time. No cranial nerve deficit.  Skin: No rash noted. No erythema.  Psychiatric: Affect normal.   LABORATORY PANEL:  Male CBC  Recent Labs Lab 10/27/16 0455  WBC 16.9*  HGB 14.6  HCT 43.5  PLT 298   ------------------------------------------------------------------------------------------------------------------ Chemistries   Recent Labs Lab 10/26/16 0444 10/27/16 0455  NA 140 139  K 3.5 3.7  CL 104 104  CO2 29 26  GLUCOSE 238* 115*  BUN 16 20  CREATININE 1.12 1.09  CALCIUM 9.1 9.3  MG 2.1  --    RADIOLOGY:  Dg Chest 1 View  Result Date: 10/27/2016 CLINICAL DATA:  Congestive heart failure, morbid obesity, smoker. EXAM: CHEST 1 VIEW COMPARISON:  PA and lateral chest x-ray of October 24, 2016 FINDINGS: The lungs are well-expanded. The interstitial markings have improved markedly and remain only minimally prominent at the bases. There is no pleural effusion or pneumothorax. The cardiac silhouette remains enlarged. The pulmonary vascularity is less engorged and more distinct. IMPRESSION: Marked improvement in the appearance of the pulmonary interstitium consistent with resolving pneumonia or edema. Stable mild cardiomegaly. Electronically Signed   By: David  Martinique M.D.   On: 10/27/2016 07:21   ASSESSMENT AND PLAN:    1.  Acute systolic CHF with cardiomyopathy: Echo this admission with moderate LV dilation, EF 15-20%.  Per Dr. Aundra Dubin,  CXR with bilateral airspace disease, may actually be all pulmonary edema rather than PNA.  require at least 2-3 days of IV lasix per Dr.  Fletcher Anon. - Continue low dose Coreg and spironolactone 12.5 mg daily.  Started Entresto and discontined losartan to 12.5 mg bid.  2.  NSTEMI Cardiac cath:  1. Minimal luminal irregularities with no evidence of obstructive coronary artery disease. 2. Severely reduced LV systolic function with an EF of 15% with global hypokinesis. 3. Right heart catheterization showed severely elevated filling pressures, moderate pulmonary hypertension and normal cardiac output. Off heparin drip, continue ASA and Lipitor.   3. Bilateral pulmonary edema, not pneumonia.  He was on outpatient Augmentin and doxycycline.  Discontinued IV Rocephin and Zithromax. Pro calcitonin are negative which is against bacterial pneumonia. .  Not COPD exacerbation. Discontinued IV Solu-Medrol, continue budesonide, Continue DuoNeb nebulizers.  Leukocytosis. WBC increased today. Possible due to steroids. Follow-up CBC.  4. Tobacco abuse smoking cessation counseling done 4 minutes. 5. Cocaine abuse. Urine toxicology showed positive cocaine.  Per psych consult,  Dr. Einar Grad, he need to call the substance abuse program at the [facility] once he is discharged. Number to call [phone]  Hyperlipidemia. Continue Lipitor. LDL 131. Morbid obesity. Check lipid panel and hemoglobin A1c.  PTSD. Consider starting him on Zoloft at 25 mg once daily and trazodone 50 mg at bedtime to help him sleep if medically okay per psych consult.  I discussed with Dr. Fletcher Anon. All the records are reviewed and case discussed with Care Management/Social Worker. Management plans discussed with the patient, family and they are in agreement.  CODE STATUS: Full Code  TOTAL TIME TAKING CARE OF THIS PATIENT: 35 minutes.   More than 50% of the time was spent in counseling/coordination of care: YES  POSSIBLE D/C IN 2-3 DAYS, DEPENDING ON CLINICAL CONDITION.   Demetrios Loll M.D on 10/27/2016 at 2:04 PM  Between 7am to 6pm - Pager -  [phone]  After 6pm go to [email or web address] - Proofreader  [facility]  Office  [phone]  CC: Primary care physician; Patient, No Pcp Per  Note: This dictation was prepared with Dragon dictation along with smaller phrase technology. Any transcriptional errors that result from this process are unintentional.

## 2016-10-27 NOTE — Progress Notes (Signed)
Chest pain still 5/10, nitro 0.5 sublingual given at this time, family at bedside,

## 2016-10-27 NOTE — Interval H&P Note (Signed)
Cath Lab Visit (complete for each Cath Lab visit)  Clinical Evaluation Leading to the Procedure:   ACS: Yes.    Non-ACS:  n/a   History and Physical Interval Note:  10/27/2016 9:06 AM  Carlos Brewer.  has presented today for surgery, with the diagnosis of nstemi  The various methods of treatment have been discussed with the patient and family. After consideration of risks, benefits and other options for treatment, the patient has consented to  Procedure(s): RIGHT/LEFT HEART CATH AND CORONARY ANGIOGRAPHY (N/A) as a surgical intervention .  The patient's history has been reviewed, patient examined, no change in status, stable for surgery.  I have reviewed the patient's chart and labs.  Questions were answered to the patient's satisfaction.     Kathlyn Sacramento

## 2016-10-27 NOTE — H&P (View-Only) (Signed)
Patient ID: Carlos Brewer., male   DOB: Dec 24, 1985, 31 y.o.   MRN: [id number]      Subjective:    Good diuresis yesterday with IV Lasix.  Overnight, he had chest pain, non-pleuritic.  Upset because of slow nursing response.  Had morphine this morning and is chest pain-free currently.  Pain was not pleuritic.  Breathing is much better today after diuresis.   Objective:   Weight Range: 280 lb (127 kg) Body mass index is 37.97 kg/m.   Vital Signs:   Temp:  [97.4 F (36.3 C)-98 F (36.7 C)] 97.6 F (36.4 C) (08/12 1016) Pulse Rate:  [91-101] 94 (08/12 1016) Resp:  [18] 18 (08/12 0754) BP: (112-130)/(73-83) 112/73 (08/12 1016) SpO2:  [95 %-100 %] 97 % (08/12 1016) Last BM Date: 10/26/16  Weight change: Filed Weights   10/24/16 0952  Weight: 280 lb (127 kg)    Intake/Output:   Intake/Output Summary (Last 24 hours) at 10/26/16 1140 Last data filed at 10/26/16 1018  Gross per 24 hour  Intake           807.57 ml  Output             2930 ml  Net         -2122.43 ml      Physical Exam    General: NAD Neck: JVP 7-8 cm, no thyromegaly or thyroid nodule.  Lungs: Clear to auscultation bilaterally with normal respiratory effort. CV: Nondisplaced PMI.  Heart regular S1/S2, +S3, no murmur.  No peripheral edema.   Abdomen: Soft, nontender, no hepatosplenomegaly, no distention.  Skin: Intact without lesions or rashes.  Neurologic: Alert and oriented x 3.  Psych: Normal affect. Extremities: No clubbing or cyanosis.  HEENT: Normal.    Telemetry   Personally reviewed, NSR  Labs    CBC  Recent Labs  10/25/16 0106 10/26/16 0444  WBC 8.8 16.7*  HGB 14.0 13.5  HCT 41.0 40.1  MCV 89.1 90.8  PLT 295 275   Basic Metabolic Panel  Recent Labs  10/25/16 0106 10/26/16 0444  NA 139 140  K 3.7 3.5  CL 104 104  CO2 27 29  GLUCOSE 201* 238*  BUN 15 16  CREATININE 1.26* 1.12  CALCIUM 9.5 9.1  MG  --  2.1   Liver Function Tests No results for input(s): AST,  ALT, ALKPHOS, BILITOT, PROT, ALBUMIN in the last 72 hours. No results for input(s): LIPASE, AMYLASE in the last 72 hours. Cardiac Enzymes  Recent Labs  10/24/16 0954 10/24/16 1437 10/24/16 1847  TROPONINI 3.81* 5.38* 4.17*    BNP: BNP (last 3 results)  Recent Labs  10/24/16 1133  BNP 676.0*    ProBNP (last 3 results) No results for input(s): PROBNP in the last 8760 hours.   D-Dimer No results for input(s): DDIMER in the last 72 hours. Hemoglobin A1C No results for input(s): HGBA1C in the last 72 hours. Fasting Lipid Panel  Recent Labs  10/26/16 0444  CHOL 213*  HDL 62  LDLCALC 130*  TRIG 106  CHOLHDL 3.4   Thyroid Function Tests No results for input(s): TSH, T4TOTAL, T3FREE, THYROIDAB in the last 72 hours.  Invalid input(s): FREET3  Other results:   Imaging    No results found.   Medications:     Scheduled Medications: . aspirin EC  81 mg Oral Daily  . atorvastatin  80 mg Oral q1800  . budesonide (PULMICORT) nebulizer solution  0.5 mg Nebulization BID  . carvedilol  3.125 mg Oral BID WC  . fluticasone  1 spray Each Nare QHS  . [START ON 10/27/2016] furosemide  40 mg Oral Daily  . insulin aspart  0-5 Units Subcutaneous QHS  . insulin aspart  0-9 Units Subcutaneous TID WC  . ipratropium-albuterol  3 mL Nebulization TID  . loratadine  10 mg Oral Daily  . mupirocin ointment  1 application Topical BID  . nicotine  21 mg Transdermal Daily  . sacubitril-valsartan  1 tablet Oral BID  . sodium chloride flush  3 mL Intravenous Q12H  . spironolactone  12.5 mg Oral Daily    Infusions: . heparin 1,400 Units/hr (10/24/16 1212)    PRN Medications: acetaminophen **OR** acetaminophen, benzonatate, morphine injection, nitroGLYCERIN, oxyCODONE-acetaminophen    Patient Profile   31 recently diagnosed with PNA presented with worsening dyspnea and chest discomfort. CXR with bilateral infiltrates, elevated troponin, EF 15-20% on echo.   Assessment/Plan     1. Acute systolic CHF: Echo this admission with moderate LV dilation, EF 15-20%.  CXR with bilateral airspace disease, may actually be all pulmonary edema rather than PNA. No family history of cardiomyopathy.  He does have history of HTN.  Urine positive for cocaine. Troponin elevated, peaked at 5.38 => ?related to myocarditis versus coronary disease versus demand ischemia from volume overload (seems high for demand ischemia).  Differential for cardiomyopathy = cocaine-related, CAD, viral myocarditis.  HIV negative. Volume status much better on exam after IV diuresis.   - Got Lasix 40 mg IV x 1 this morning, transition to Lasix 40 mg po daily tomorrow.  - Can continue low dose Coreg.  - Stable BP, will stop losartan and start Entresto 24/26 bid.  - Continue spironolactone 12.5 mg daily.  - Plan for right/left heart cath on Monday to assess filling pressure, cardiac output, and for coronary disease.  Discussed risks/benefits, patient agrees to proceed.  If no coronary disease, should eventually have cardiac MRI.  2. PNA: CXR may have actually been reflective of pulmonary edema.  No fever or elevation in WBCs.  He is currently being covered with ceftriaxone/azithromycin.  3. Cocaine abuse: Needs to stop completely.  4. Chest pain: Had further chest pain overnight, now CP-free.  Possibly myopericarditis though cannot rule out CAD.  - D dimer sent (think PE is unlikely).  - Would repeat troponin - No acute ECG changes at admission, need to repeat ECG.  - As above, will have cath tomorrow.   Length of Stay: 2  Loralie Champagne, MD  10/26/2016, 11:40 AM  Advanced Heart Failure Team Pager [phone] (M-F; 7a - 4p)  Please contact [facility] for night-coverage after hours (4p -7a ) and weekends on [email or web address]

## 2016-10-27 NOTE — Care Management (Signed)
Cardiac cath did not show any occlusive disease but has a very low EF of < 25%.  It is possible may trial entresto.

## 2016-10-27 NOTE — Progress Notes (Signed)
Patient c/o chest pain 6/10 , 2 morphine administer via Iv given, will continue to monitor

## 2016-10-28 DIAGNOSIS — I428 Other cardiomyopathies: Secondary | ICD-10-CM

## 2016-10-28 LAB — CBC
HCT: 43.9 % (ref 40.0–52.0)
HEMOGLOBIN: 14.9 g/dL (ref 13.0–18.0)
MCH: 30.5 pg (ref 26.0–34.0)
MCHC: 34 g/dL (ref 32.0–36.0)
MCV: 89.6 fL (ref 80.0–100.0)
Platelets: 289 10*3/uL (ref 150–440)
RBC: 4.9 MIL/uL (ref 4.40–5.90)
RDW: 12.8 % (ref 11.5–14.5)
WBC: 12.5 10*3/uL — ABNORMAL HIGH (ref 3.8–10.6)

## 2016-10-28 LAB — GLUCOSE, CAPILLARY
GLUCOSE-CAPILLARY: 130 mg/dL — AB (ref 65–99)
GLUCOSE-CAPILLARY: 145 mg/dL — AB (ref 65–99)
Glucose-Capillary: 115 mg/dL — ABNORMAL HIGH (ref 65–99)
Glucose-Capillary: 127 mg/dL — ABNORMAL HIGH (ref 65–99)
Glucose-Capillary: 106 mg/dL — ABNORMAL HIGH (ref 65–99)

## 2016-10-28 LAB — BASIC METABOLIC PANEL
ANION GAP: 9 (ref 5–15)
BUN: 17 mg/dL (ref 6–20)
CALCIUM: 9.5 mg/dL (ref 8.9–10.3)
CO2: 27 mmol/L (ref 22–32)
CREATININE: 0.96 mg/dL (ref 0.61–1.24)
Chloride: 102 mmol/L (ref 101–111)
GFR calc Af Amer: >60 mL/min (ref >60)
GFR calc non Af Amer: >60 mL/min (ref >60)
Glucose, Bld: 116 mg/dL — ABNORMAL HIGH (ref 65–99)
Potassium: 4 mmol/L (ref 3.5–5.1)
Sodium: 138 mmol/L (ref 135–145)

## 2016-10-28 MED ORDER — ATORVASTATIN CALCIUM 20 MG PO TABS
40.0000 mg | ORAL_TABLET | Freq: Every day | ORAL | Status: DC
  Administered 2016-10-28 – 2016-10-29 (×2): 40 mg via ORAL
  Filled 2016-10-28 (×2): qty 2

## 2016-10-28 NOTE — Care Management (Signed)
Have not received the PA Entresto form and contacted [employer].  Informed that "the test claim did not go through" so can not start authorization.  CM discussed  was not trying to get a claimed denied to initiate authorization.  Informed that the prior authorization has not been started and that "there is no way for an authorization form to be faxed to CM to expedite the process.

## 2016-10-28 NOTE — Care Management (Signed)
CM was unable to find if patient had any pharmacy benefits through [employer] and informed there was no pharmacy coverage.  CM contacted human resources at patient's employer and informed the pharmacy coverage is through [place] 1 [phone].  Information on file for patient does not meet  the required 3 data sources for [employer]'s HIPPA criteria. discussed with patient and he has since moved.  Instructed patient he will need to contact his employer and provide updated phone number and address.  [employer] then able to confirm patient does have pharmacy coverage/. 30 day generic copay: 5 dollars, Formulary meds: 25 dollars; Non formulary- 40 dollars. Delene Loll will require prior auth and informed that prescription needs to be sent to a pharmacy and denied before prior auth could be started.  CM explained care team wish to have the prior auth initiated prior to discharge to prevent issues obtaining medication. Company will initiate and fax form to CM.  Prior approval usually takes 48 hours.  An appointment has been made at the heart failure clinic.  Living with Heart Failure Education Booklet has been provided.  Provided patient with scales.  Provided with Entresto 30 day trial coupon and the copay assist coupon.  Instructed patient to activate the copay card prior to leaving hospital in case there are issues with the card.

## 2016-10-28 NOTE — Progress Notes (Signed)
[facility] at [place] NAME: Carlos Brewer    MR#:  [id number]  DATE OF BIRTH:  05/04/1985  SUBJECTIVE:  CHIEF COMPLAINT:   Chief Complaint  Patient presents with  . Chest Pain   No chest pain, cough or shortness of breath. He had an episode of right upper quadrant and chest pain last night  REVIEW OF SYSTEMS:  Review of Systems  Constitutional: Negative for chills, fever and malaise/fatigue.  HENT: Negative for sore throat.   Eyes: Negative for blurred vision and double vision.  Respiratory: Negative for cough, hemoptysis, sputum production, shortness of breath, wheezing and stridor.   Cardiovascular: Negative for chest pain, palpitations, orthopnea and leg swelling.  Gastrointestinal: Negative for abdominal pain, blood in stool, diarrhea, melena, nausea and vomiting.  Genitourinary: Negative for dysuria, flank pain and hematuria.  Musculoskeletal: Negative for back pain and joint pain.  Skin: Negative for rash.  Neurological: Negative for dizziness, sensory change, focal weakness, seizures, loss of consciousness, weakness and headaches.  Endo/Heme/Allergies: Negative for polydipsia.  Psychiatric/Behavioral: Negative for depression. The patient is not nervous/anxious.     DRUG ALLERGIES:  No Known Allergies VITALS:  Blood pressure (!) 99/42, pulse 100, temperature 98 F (36.7 C), temperature source Oral, resp. rate 18, height 6' (1.829 m), weight 287 lb 6.4 oz (130.4 kg), SpO2 98 %. PHYSICAL EXAMINATION:  Physical Exam  Constitutional: He is oriented to person, place, and time and well-developed, well-nourished, and in no distress.  Morbid obesity.  HENT:  Head: Normocephalic.  Mouth/Throat: Oropharynx is clear and moist.  Eyes: Pupils are equal, round, and reactive to light. Conjunctivae and EOM are normal. No scleral icterus.  Neck: Normal range of motion. Neck supple. No JVD present. No tracheal deviation present.    Cardiovascular: Normal rate, regular rhythm and normal heart sounds.  Exam reveals no gallop.   No murmur heard. Pulmonary/Chest: Effort normal and breath sounds normal. No respiratory distress. He has no wheezes. He has no rales.  Abdominal: Soft. Bowel sounds are normal. He exhibits no distension. There is no tenderness. There is no rebound.  Musculoskeletal: Normal range of motion. He exhibits no edema or tenderness.  Neurological: He is alert and oriented to person, place, and time. No cranial nerve deficit.  Skin: No rash noted. No erythema.  Psychiatric: Affect normal.   LABORATORY PANEL:  Male CBC  Recent Labs Lab 10/28/16 0619  WBC 12.5*  HGB 14.9  HCT 43.9  PLT 289   ------------------------------------------------------------------------------------------------------------------ Chemistries   Recent Labs Lab 10/26/16 0444  10/28/16 0619  NA 140  < > 138  K 3.5  < > 4.0  CL 104  < > 102  CO2 29  < > 27  GLUCOSE 238*  < > 116*  BUN 16  < > 17  CREATININE 1.12  < > 0.96  CALCIUM 9.1  < > 9.5  MG 2.1  --   --   < > = values in this interval not displayed. RADIOLOGY:  No results found. ASSESSMENT AND PLAN:    1.  Acute systolic CHF secondary to nonischemic cardiomyopathycardiomyopathy:  Echo this admission with moderate LV dilation, EF 15-20%.  Per Dr. Aundra Dubin,  CXR with bilateral airspace disease, may actually be all pulmonary edema rather than PNA.  require at least 2-3 days of IV lasix per Dr. Fletcher Anon. - Continue low dose Coreg and spironolactone 12.5 mg daily.  Started Entresto and discontined losartan to 12.5 mg bid. Per  Dr. Saunders Revel, he will likely need cardiac MRI and referral to the advanced heart failure clinic for further assessment and management of his nonischemic cardiomyopathy.  2.  NSTEMI Cardiac cath:  1. Minimal luminal irregularities with no evidence of obstructive coronary artery disease. 2. Severely reduced LV systolic function with an EF of  15% with global hypokinesis. 3. Right heart catheterization showed severely elevated filling pressures, moderate pulmonary hypertension and normal cardiac output. Off heparin drip, continue ASA and Lipitor.   3. Bilateral pulmonary edema, not pneumonia.  He was on outpatient Augmentin and doxycycline.  Discontinued IV Rocephin and Zithromax. Pro calcitonin are negative which is against bacterial pneumonia. .  Not COPD exacerbation. Discontinued IV Solu-Medrol, continue budesonide, Continue DuoNeb nebulizers.  Leukocytosis. WBC increased today. Possible due to steroids.  4. Tobacco abuse smoking cessation counseling done 4 minutes. 5. Cocaine abuse. Urine toxicology showed positive cocaine.  Per psych consult,  Dr. Einar Grad, he need to call the substance abuse program at the [facility] once he is discharged. Number to call [phone]  Hyperlipidemia. Continue Lipitor. LDL 131. Morbid obesity. Check lipid panel and hemoglobin A1c.  PTSD.  stared Zoloft at 25 mg once daily and trazodone 50 mg at bedtime to help him sleep.  All the records are reviewed and case discussed with Care Management/Social Worker. Management plans discussed with the patient, family and they are in agreement.  CODE STATUS: Full Code  TOTAL TIME TAKING CARE OF THIS PATIENT: 32 minutes.   More than 50% of the time was spent in counseling/coordination of care: YES  POSSIBLE D/C IN 2-3 DAYS, DEPENDING ON CLINICAL CONDITION.   Demetrios Loll M.D on 10/28/2016 at 1:40 PM  Between 7am to 6pm - Pager - [phone]  After 6pm go to [email or web address] - Proofreader  [facility]  Office  [phone]  CC: Primary care physician; Patient, No Pcp Per  Note: This dictation was prepared with Dragon dictation along with smaller phrase technology. Any transcriptional errors that result from this process are unintentional.

## 2016-10-28 NOTE — Progress Notes (Signed)
Progress Note  Patient Name: Carlos Brewer. Date of Encounter: 10/28/2016  Primary Cardiologist: M. Fletcher Anon, MD   Subjective   Breathing stable.  Had some focal, right sided chest discomfort last night.  Resolved w/ morphine - no recurrence.  Inpatient Medications    Scheduled Meds: . aspirin EC  81 mg Oral Daily  . budesonide (PULMICORT) nebulizer solution  0.5 mg Nebulization BID  . carvedilol  3.125 mg Oral BID WC  . enoxaparin (LOVENOX) injection  40 mg Subcutaneous Q24H  . fluticasone  1 spray Each Nare QHS  . furosemide  40 mg Intravenous Q12H  . insulin aspart  0-5 Units Subcutaneous QHS  . insulin aspart  0-9 Units Subcutaneous TID WC  . ipratropium-albuterol  3 mL Nebulization BID  . loratadine  10 mg Oral Daily  . mupirocin ointment  1 application Topical BID  . nicotine  21 mg Transdermal Daily  . potassium chloride  40 mEq Oral Daily  . sacubitril-valsartan  1 tablet Oral BID  . sertraline  25 mg Oral Daily  . sodium chloride flush  3 mL Intravenous Q12H  . sodium chloride flush  3 mL Intravenous Q12H  . spironolactone  12.5 mg Oral Daily  . traZODone  50 mg Oral QHS   Continuous Infusions: . sodium chloride     PRN Meds: sodium chloride, acetaminophen **OR** acetaminophen, alum & mag hydroxide-simeth, benzonatate, morphine injection, nitroGLYCERIN, oxyCODONE-acetaminophen, sodium chloride flush   Vital Signs    Vitals:   10/27/16 1943 10/27/16 1955 10/28/16 0330 10/28/16 0814  BP:  (!) 117/58 (!) 108/58 (!) 99/42  Pulse:  91 96 100  Resp:  17 17 18   Temp:  97.7 F (36.5 C) 97.9 F (36.6 C) 98 F (36.7 C)  TempSrc:  Oral Oral Oral  SpO2: 97% 100% 96% 98%  Weight:   287 lb 6.4 oz (130.4 kg)   Height:        Intake/Output Summary (Last 24 hours) at 10/28/16 0912 Last data filed at 10/28/16 0631  Gross per 24 hour  Intake              240 ml  Output             1400 ml  Net            -1160 ml   Filed Weights   10/27/16 0500 10/27/16  0831 10/28/16 0330  Weight: 283 lb 3.2 oz (128.5 kg) 283 lb (128.4 kg) 287 lb 6.4 oz (130.4 kg)    Physical Exam   GEN: Well nourished, well developed, in no acute distress.  HEENT: Grossly normal.  Neck: Supple, no JVD, carotid bruits, or masses. Cardiac: RRR, distant, no murmurs, rubs, or gallops. No clubbing, cyanosis, edema.  Radials/DP/PT 2+ and equal bilaterally. R wrist and brachial cath sites w/o bleeding/bruit/hematomas. Respiratory:  Respirations regular and unlabored, clear to auscultation bilaterally. GI: Soft, nontender, nondistended, BS + x 4. MS: no deformity or atrophy. Skin: warm and dry, no rash. Neuro:  Strength and sensation are intact. Psych: AAOx3.  Normal affect.  Labs    Chemistry Recent Labs Lab 10/26/16 0444 10/27/16 0455 10/28/16 0619  NA 140 139 138  K 3.5 3.7 4.0  CL 104 104 102  CO2 29 26 27   GLUCOSE 238* 115* 116*  BUN 16 20 17   CREATININE 1.12 1.09 0.96  CALCIUM 9.1 9.3 9.5  GFRNONAA >60 >60 >60  GFRAA >60 >60 >60  ANIONGAP 7 9 9  Hematology Recent Labs Lab 10/26/16 0444 10/27/16 0455 10/28/16 0619  WBC 16.7* 16.9* 12.5*  RBC 4.42 4.84 4.90  HGB 13.5 14.6 14.9  HCT 40.1 43.5 43.9  MCV 90.8 89.9 89.6  MCH 30.7 30.2 30.5  MCHC 33.8 33.6 34.0  RDW 12.6 13.0 12.8  PLT 281 298 289    Cardiac Enzymes Recent Labs Lab 10/24/16 0954 10/24/16 1437 10/24/16 1847 10/26/16 0444  TROPONINI 3.81* 5.38* 4.17* 1.26*     BNP Recent Labs Lab 10/24/16 1133  BNP 676.0*      Radiology    Dg Chest 1 View  Result Date: 10/27/2016 CLINICAL DATA:  Congestive heart failure, morbid obesity, smoker. EXAM: CHEST 1 VIEW COMPARISON:  PA and lateral chest x-ray of October 24, 2016 FINDINGS: The lungs are well-expanded. The interstitial markings have improved markedly and remain only minimally prominent at the bases. There is no pleural effusion or pneumothorax. The cardiac silhouette remains enlarged. The pulmonary vascularity is less  engorged and more distinct. IMPRESSION: Marked improvement in the appearance of the pulmonary interstitium consistent with resolving pneumonia or edema. Stable mild cardiomegaly. Electronically Signed   By: David  Martinique M.D.   On: 10/27/2016 07:21    Telemetry    RSR - Personally Reviewed  Cardiac Studies   2D Echocardiogram 8.10.2018  Study Conclusions   - Procedure narrative: Transthoracic echocardiography. The study   was technically difficult. - Left ventricle: The cavity size was moderately dilated. Wall   thickness was normal. Systolic function was severely reduced. The   estimated ejection fraction was in the range of 15% to 20%.   Features are consistent with a pseudonormal left ventricular   filling pattern, with concomitant abnormal relaxation and   increased filling pressure (grade 2 diastolic dysfunction). - Left atrium: The atrium was mildly dilated. - Right atrium: The atrium was mildly dilated. - Pulmonary arteries: Systolic pressure could not be accurately   estimated. _____________  Cardiac Catheterization 8.13.2018   There is severe left ventricular systolic dysfunction.  LV end diastolic pressure is severely elevated.  The left ventricular ejection fraction is less than 25% by visual estimate.   1. Minimal luminal irregularities with no evidence of obstructive coronary artery disease. 2. Severely reduced LV systolic function with an EF of 15% with global hypokinesis. 3. Right heart catheterization showed severely elevated filling pressures, moderate pulmonary hypertension and normal cardiac output. RA: 13 mmHg, RV: 59 over 18 mmHg, PA pressure: 51/33 with a mean of 42 mmHg, pulmonary capillary wedge pressure: 42 mmHg, left ventricular end-diastolic pressure was 47 mmHg. Cardiac output was 6.07 with a cardiac index of 2.46.   Patient Profile     31 y.o. male admitted 25/10 with a 2 wk h/o progressive dyspnea and acute resp failure, ? Pna, found to have  severe LV dysfxn (15-20%), + UDS for cocaine, and relatively nl cors (cath 8/13).  Assessment & Plan    1. Acute systolic CHF/NICM:  2-3 wk h/o progressive dyspnea despite outpt treatment for bilat PNA.  Admitted 8/10 with acut resp failure.  Initial CXR suggestive of worsening PNA.  EF 15-20% by echo.  Suspect CXR findings more likely pulmonary edema.  [place] 8.13 showed relatively nl cors with PAH and PCWP of 42.  He remains on IV lasix and is diuresing well.  Minus 1.2L overnight, minus 6.5L since admission.  Wts appear to be inaccurate and he will need to be stood.  Renal fxn stable.  Cont IV lasix - currently on 40  IV q12h.  Pressures soft - may not tolerate a higher dose or increased frequency of dosing.  Would cont to diurese until we see some bump in BUN/Creat/Bicarb.  Cont  blocker, entresto, spiro.  BP's soft.  No room to add bidil right now.  UDS + cocaine and cessation has been advised.  He will need f/u echo in 40 days to determine candidacy for ICD therapy, assuming he is compliant with meds/lifestyle.  2.  NSTEMI:  Peak trop 5.38.  Minimal CAD on cath.  ? Role of cocaine induced coronary vasospasm vs demand ischemia in setting of severe CHF/NICM.  Cont ASA.  He is on carvedilol and we have previously discussed the dangers of cocaine usage in the setting of underlying  blocker therapy.  Will add statin with LDL 130 and some amt of coronary dzs.  3.  Cocaine Abuse:  Cessation advised.  4.  PNA:  Now off of abx.  WBC coming down - had been on steroids.  Afebrile.  5.  Hyperglycemia:  SSI per IM.  Check A1c.  Signed, Murray Hodgkins, NP  10/28/2016, 9:12 AM

## 2016-10-29 DIAGNOSIS — F1410 Cocaine abuse, uncomplicated: Secondary | ICD-10-CM

## 2016-10-29 DIAGNOSIS — R0603 Acute respiratory distress: Secondary | ICD-10-CM

## 2016-10-29 LAB — BASIC METABOLIC PANEL
ANION GAP: 9 (ref 5–15)
BUN: 21 mg/dL — ABNORMAL HIGH (ref 6–20)
CHLORIDE: 101 mmol/L (ref 101–111)
CO2: 26 mmol/L (ref 22–32)
Calcium: 9.3 mg/dL (ref 8.9–10.3)
Creatinine, Ser: 1.29 mg/dL — ABNORMAL HIGH (ref 0.61–1.24)
GFR calc non Af Amer: >60 mL/min (ref >60)
Glucose, Bld: 114 mg/dL — ABNORMAL HIGH (ref 65–99)
Potassium: 3.9 mmol/L (ref 3.5–5.1)
Sodium: 136 mmol/L (ref 135–145)

## 2016-10-29 LAB — GLUCOSE, CAPILLARY
GLUCOSE-CAPILLARY: 110 mg/dL — AB (ref 65–99)
GLUCOSE-CAPILLARY: 115 mg/dL — AB (ref 65–99)
Glucose-Capillary: 116 mg/dL — ABNORMAL HIGH (ref 65–99)
Glucose-Capillary: 123 mg/dL — ABNORMAL HIGH (ref 65–99)

## 2016-10-29 LAB — FERRITIN: Ferritin: 336 ng/mL (ref 24–336)

## 2016-10-29 LAB — CULTURE, BLOOD (ROUTINE X 2)
Culture: NO GROWTH
Culture: NO GROWTH

## 2016-10-29 LAB — MISC LABCORP TEST (SEND OUT): Labcorp test code: 1453

## 2016-10-29 MED ORDER — SPIRONOLACTONE 25 MG PO TABS: 12.5000 mg | ORAL_TABLET | Freq: Every day | ORAL | 0 refills | Status: DC

## 2016-10-29 MED ORDER — FUROSEMIDE 20 MG PO TABS
20.0000 mg | ORAL_TABLET | Freq: Two times a day (BID) | ORAL | Status: DC
  Administered 2016-10-29 – 2016-10-30 (×2): 20 mg via ORAL
  Filled 2016-10-29 (×2): qty 1

## 2016-10-29 MED ORDER — FUROSEMIDE 20 MG PO TABS
20.0000 mg | ORAL_TABLET | Freq: Once | ORAL | Status: AC
  Administered 2016-10-29: 20 mg via ORAL
  Filled 2016-10-29: qty 1

## 2016-10-29 MED ORDER — ATORVASTATIN CALCIUM 40 MG PO TABS: 40.0000 mg | ORAL_TABLET | Freq: Every day | ORAL | 2 refills | Status: DC

## 2016-10-29 MED ORDER — SACUBITRIL-VALSARTAN 24-26 MG PO TABS: 1.0000 | ORAL_TABLET | Freq: Two times a day (BID) | ORAL | 0 refills | Status: DC

## 2016-10-29 MED ORDER — TRAZODONE HCL 50 MG PO TABS: 50.0000 mg | ORAL_TABLET | Freq: Every day | ORAL | 0 refills | Status: DC

## 2016-10-29 MED ORDER — FUROSEMIDE 20 MG PO TABS: 20.0000 mg | ORAL_TABLET | Freq: Two times a day (BID) | ORAL | 2 refills | Status: DC

## 2016-10-29 MED ORDER — CARVEDILOL 3.125 MG PO TABS: 3.1250 mg | ORAL_TABLET | Freq: Two times a day (BID) | ORAL | 2 refills | Status: DC

## 2016-10-29 MED ORDER — NITROGLYCERIN 0.4 MG SL SUBL: 0.4000 mg | SUBLINGUAL_TABLET | SUBLINGUAL | 2 refills | Status: DC | PRN

## 2016-10-29 MED ORDER — SERTRALINE HCL 25 MG PO TABS: 25.0000 mg | ORAL_TABLET | Freq: Every day | ORAL | 0 refills | Status: DC

## 2016-10-29 MED ORDER — ASPIRIN 81 MG PO TBEC: 81.0000 mg | DELAYED_RELEASE_TABLET | Freq: Every day | ORAL | 2 refills | Status: AC

## 2016-10-29 NOTE — Progress Notes (Signed)
[facility] [place] NAME: Samuele Storey    MR#:  [id number]  DATE OF BIRTH:  [birth year]  SUBJECTIVE:  CHIEF COMPLAINT:   Chief Complaint  Patient presents with  . Chest Pain   No chest pain, cough or shortness of breath.  REVIEW OF SYSTEMS:  Review of Systems  Constitutional: Negative for chills, fever and malaise/fatigue.  HENT: Negative for sore throat.   Eyes: Negative for blurred vision and double vision.  Respiratory: Negative for cough, hemoptysis, sputum production, shortness of breath, wheezing and stridor.   Cardiovascular: Negative for chest pain, palpitations, orthopnea and leg swelling.  Gastrointestinal: Negative for abdominal pain, blood in stool, diarrhea, melena, nausea and vomiting.  Genitourinary: Negative for dysuria, flank pain and hematuria.  Musculoskeletal: Negative for back pain and joint pain.  Skin: Negative for rash.  Neurological: Negative for dizziness, sensory change, focal weakness, seizures, loss of consciousness, weakness and headaches.  Endo/Heme/Allergies: Negative for polydipsia.  Psychiatric/Behavioral: Negative for depression. The patient is not nervous/anxious.     DRUG ALLERGIES:  No Known Allergies VITALS:  Blood pressure (!) 92/54, pulse 97, temperature 98.2 F (36.8 C), temperature source Oral, resp. rate 19, height 6' (1.829 m), weight 285 lb 1.6 oz (129.3 kg), SpO2 97 %. PHYSICAL EXAMINATION:  Physical Exam  Constitutional: He is oriented to person, place, and time and well-developed, well-nourished, and in no distress.  Morbid obesity.  HENT:  Head: Normocephalic.  Mouth/Throat: Oropharynx is clear and moist.  Eyes: Pupils are equal, round, and reactive to light. Conjunctivae and EOM are normal. No scleral icterus.  Neck: Normal range of motion. Neck supple. No JVD present. No tracheal deviation present.  Cardiovascular: Normal rate, regular rhythm and normal heart sounds.  Exam  reveals no gallop.   No murmur heard. Pulmonary/Chest: Effort normal and breath sounds normal. No respiratory distress. He has no wheezes. He has no rales.  Abdominal: Soft. Bowel sounds are normal. He exhibits no distension. There is no tenderness. There is no rebound.  Musculoskeletal: Normal range of motion. He exhibits no edema or tenderness.  Neurological: He is alert and oriented to person, place, and time. No cranial nerve deficit.  Skin: No rash noted. No erythema.  Psychiatric: Affect normal.   LABORATORY PANEL:  Male CBC  Recent Labs Lab 10/28/16 0619  WBC 12.5*  HGB 14.9  HCT 43.9  PLT 289   ------------------------------------------------------------------------------------------------------------------ Chemistries   Recent Labs Lab 10/26/16 0444  10/29/16 0421  NA 140  < > 136  K 3.5  < > 3.9  CL 104  < > 101  CO2 29  < > 26  GLUCOSE 238*  < > 114*  BUN 16  < > 21*  CREATININE 1.12  < > 1.29*  CALCIUM 9.1  < > 9.3  MG 2.1  --   --   < > = values in this interval not displayed. RADIOLOGY:  No results found. ASSESSMENT AND PLAN:    1.  Acute systolic CHF secondary to nonischemic cardiomyopathycardiomyopathy:  Echo this admission with moderate LV dilation, EF 15-20%.  Per Dr. Aundra Dubin,  CXR with bilateral airspace disease, may actually be all pulmonary edema rather than PNA.  require at least 2-3 days of IV lasix per Dr. Fletcher Anon. - Continue low dose Coreg and spironolactone 12.5 mg daily.  Started Entresto and discontined losartan to 12.5 mg bid. Per Dr. Saunders Revel, he will likely need cardiac MRI and referral to the advanced heart  failure clinic for further assessment and management of his nonischemic cardiomyopathy. Hold lasix due to elevated Creatinine. Start by mouth Lasix tonight and follow-up BMP in a.m.  2.  NSTEMI Cardiac cath:  1. Minimal luminal irregularities with no evidence of obstructive coronary artery disease. 2. Severely reduced LV systolic  function with an EF of 15% with global hypokinesis. 3. Right heart catheterization showed severely elevated filling pressures, moderate pulmonary hypertension and normal cardiac output. Off heparin drip, continue ASA and Lipitor.   3. Bilateral pulmonary edema, not pneumonia.  He was on outpatient Augmentin and doxycycline.  Discontinued IV Rocephin and Zithromax. Pro calcitonin are negative which is against bacterial pneumonia. .  Not COPD exacerbation. Discontinued IV Solu-Medrol, continue budesonide, Continue DuoNeb nebulizers.  Leukocytosis. Possible due to steroids. Improving.  4. Tobacco abuse smoking cessation counseling done 4 minutes. 5. Cocaine abuse. Urine toxicology showed positive cocaine.  Per psych consult,  Dr. Einar Grad, he need to call the substance abuse program at the [facility] once he is discharged. Number to call [phone]  Hyperlipidemia. Continue Lipitor. LDL 131. Morbid obesity. pending hemoglobin A1c.  PTSD.  stared Zoloft at 25 mg once daily and trazodone 50 mg at bedtime to help him sleep.  All the records are reviewed and case discussed with Care Management/Social Worker. Management plans discussed with the patient, family and they are in agreement.  CODE STATUS: Full Code  TOTAL TIME TAKING CARE OF THIS PATIENT: 26 minutes.   More than 50% of the time was spent in counseling/coordination of care: YES  POSSIBLE D/C IN 2-3 DAYS, DEPENDING ON CLINICAL CONDITION.   Demetrios Loll M.D on 10/29/2016 at 1:55 PM  Between 7am to 6pm - Pager - [phone]  After 6pm go to [email or web address] - Proofreader  [facility]  Office  [phone]  CC: Primary care physician; Patient, No Pcp Per  Note: This dictation was prepared with Dragon dictation along with smaller phrase technology. Any transcriptional errors that result from this process are unintentional.

## 2016-10-29 NOTE — Discharge Instructions (Signed)
Heart Failure Clinic appointment on November 04 2016 at 0900 with Darylene Price, [place]. Please call [phone] to reschedule.  Heart healthy diet. Smoking cessation Drug abuse cessation call the substance abuse program at the [facility] once he is discharged. Number to call [phone]

## 2016-10-29 NOTE — Progress Notes (Signed)
Patient Name: Carlos Brewer. Date of Encounter: 10/29/2016  Primary Cardiologist: [facility] Problem List     Active Problems:   Pneumonia   Acute systolic heart failure (HCC)     Subjective   Breathing continues to improve. No chest pain. Net - 6.2 L for the admission. + 200 mL for the past 24 hours. Renal function bumped this morning from 0.96-->1.29 with BUN 17-->21 and stable Bicarb. Not much of an appetite.   Inpatient Medications    Scheduled Meds: . aspirin EC  81 mg Oral Daily  . atorvastatin  40 mg Oral q1800  . budesonide (PULMICORT) nebulizer solution  0.5 mg Nebulization BID  . carvedilol  3.125 mg Oral BID WC  . enoxaparin (LOVENOX) injection  40 mg Subcutaneous Q24H  . fluticasone  1 spray Each Nare QHS  . furosemide  40 mg Intravenous Q12H  . insulin aspart  0-5 Units Subcutaneous QHS  . insulin aspart  0-9 Units Subcutaneous TID WC  . ipratropium-albuterol  3 mL Nebulization BID  . loratadine  10 mg Oral Daily  . mupirocin ointment  1 application Topical BID  . nicotine  21 mg Transdermal Daily  . potassium chloride  40 mEq Oral Daily  . sacubitril-valsartan  1 tablet Oral BID  . sertraline  25 mg Oral Daily  . sodium chloride flush  3 mL Intravenous Q12H  . sodium chloride flush  3 mL Intravenous Q12H  . spironolactone  12.5 mg Oral Daily  . traZODone  50 mg Oral QHS   Continuous Infusions: . sodium chloride     PRN Meds: sodium chloride, acetaminophen **OR** acetaminophen, alum & mag hydroxide-simeth, benzonatate, morphine injection, nitroGLYCERIN, oxyCODONE-acetaminophen, sodium chloride flush   Vital Signs    Vitals:   10/28/16 1656 10/28/16 1924 10/28/16 2012 10/29/16 0502  BP: (!) 100/51 97/67  (!) 101/52  Pulse: (!) 103 99  87  Resp:  17  18  Temp:  98.3 F (36.8 C)  98 F (36.7 C)  TempSrc:  Oral  Oral  SpO2:  95% 98% 93%  Weight:    285 lb 1.6 oz (129.3 kg)  Height:        Intake/Output Summary (Last 24 hours) at  10/29/16 0732 Last data filed at 10/29/16 0100  Gross per 24 hour  Intake              240 ml  Output                0 ml  Net              240 ml   Filed Weights   10/27/16 0831 10/28/16 0330 10/29/16 0502  Weight: 283 lb (128.4 kg) 287 lb 6.4 oz (130.4 kg) 285 lb 1.6 oz (129.3 kg)    Physical Exam    GEN: Well nourished, well developed, in no acute distress.  HEENT: Grossly normal.  Neck: Supple, no JVD, carotid bruits, or masses. Cardiac: RRR, no murmurs, rubs, or gallops. No clubbing, cyanosis, trace pre-tibial edema.  Radials/DP/PT 2+ and equal bilaterally.  Respiratory:  Respirations regular and unlabored, clear to auscultation bilaterally. GI: Soft, nontender, nondistended, BS + x 4. MS: no deformity or atrophy. Skin: warm and dry, no rash. Neuro:  Strength and sensation are intact. Psych: AAOx3.  Normal affect.  Labs    CBC  Recent Labs  10/27/16 0455 10/28/16 0619  WBC 16.9* 12.5*  HGB 14.6 14.9  HCT 43.5  43.9  MCV 89.9 89.6  PLT 298 308   Basic Metabolic Panel  Recent Labs  10/28/16 0619 10/29/16 0421  NA 138 136  K 4.0 3.9  CL 102 101  CO2 27 26  GLUCOSE 116* 114*  BUN 17 21*  CREATININE 0.96 1.29*  CALCIUM 9.5 9.3   Liver Function Tests No results for input(s): AST, ALT, ALKPHOS, BILITOT, PROT, ALBUMIN in the last 72 hours. No results for input(s): LIPASE, AMYLASE in the last 72 hours. Cardiac Enzymes No results for input(s): CKTOTAL, CKMB, CKMBINDEX, TROPONINI in the last 72 hours. BNP Invalid input(s): POCBNP D-Dimer No results for input(s): DDIMER in the last 72 hours. Hemoglobin A1C No results for input(s): HGBA1C in the last 72 hours. Fasting Lipid Panel No results for input(s): CHOL, HDL, LDLCALC, TRIG, CHOLHDL, LDLDIRECT in the last 72 hours. Thyroid Function Tests No results for input(s): TSH, T4TOTAL, T3FREE, THYROIDAB in the last 72 hours.  Invalid input(s): FREET3  Telemetry    NSR - Personally Reviewed  ECG    n/a -  Personally Reviewed  Radiology    No results found.  Cardiac Studies   TTE 10/24/2016: Study Conclusions  - Procedure narrative: Transthoracic echocardiography. The study   was technically difficult. - Left ventricle: The cavity size was moderately dilated. Wall   thickness was normal. Systolic function was severely reduced. The   estimated ejection fraction was in the range of 15% to 20%.   Features are consistent with a pseudonormal left ventricular   filling pattern, with concomitant abnormal relaxation and   increased filling pressure (grade 2 diastolic dysfunction). - Left atrium: The atrium was mildly dilated. - Right atrium: The atrium was mildly dilated. - Pulmonary arteries: Systolic pressure could not be accurately   estimated.  R/LHC 10/27/2016: Conclusion     There is severe left ventricular systolic dysfunction.  LV end diastolic pressure is severely elevated.  The left ventricular ejection fraction is less than 25% by visual estimate.   1. Minimal luminal irregularities with no evidence of obstructive coronary artery disease. 2. Severely reduced LV systolic function with an EF of 15% with global hypokinesis. 3. Right heart catheterization showed severely elevated filling pressures, moderate pulmonary hypertension and normal cardiac output. RA: 13 mmHg, RV: 59 over 18 mmHg, PA pressure: 51/33 with a mean of 42 mmHg, pulmonary capillary wedge pressure: 42 mmHg, left ventricular end-diastolic pressure was 47 mmHg. Cardiac output was 6.07 with a cardiac index of 2.46.  Recommendations: The patient has nonischemic cardiomyopathy with severely reduced LV systolic function. His filling pressures are severely elevated and I suspect that his infiltrates on x-ray are all due to pulmonary edema and heart failure. The patient will require at least 2-3 days of IV diuresis. I switched him to furosemide 40 mg intravenously twice daily. Continue other heart medications.      Patient Profile     31 y.o. male admitted 96/10 with a 2 wk h/o progressive dyspnea and acute resp failure, ? Pna, found to have severe LV dysfxn (15-20%), + UDS for cocaine, and relatively nl cors (cath 8/13).  Assessment & Plan    1. Acute systolic CHF/NICM:  2-3 wk h/o progressive dyspnea despite outpt treatment for bilat PNA.  Admitted 8/10 with acut resp failure.  Initial CXR suggestive of worsening PNA.  EF 15-20% by echo. Suspect CXR findings more likely pulmonary edema.  [place] 8.13 showed relatively nl cors with PAH and PCWP of 42.  He remains on IV Lasix 40  mg bid and is diuresing well.  Minus 6.2L for the admission, + 200 mL for the past 24 hours.  Wts appear to be inaccurate and he will need to be stood.  Renal fxn bumped this morning.  Hold IV lasix this AM.  Cont ? blocker, entresto, spiro.  BP's soft.  No room to add bidil right now.  UDS + cocaine and cessation has been advised.  He will need f/u echo in 40 days to determine candidacy for ICD therapy, assuming he is compliant with meds/lifestyle.  2.  NSTEMI:  Peak trop 5.38.  Minimal CAD on cath.  ? Role of cocaine induced coronary vasospasm vs demand ischemia in setting of severe CHF/NICM.  Cont ASA.  He is on carvedilol and we have previously discussed the dangers of cocaine usage in the setting of underlying ? blocker therapy.  Continue statin with LDL 130 and some amt of coronary dzs.  3.  Cocaine Abuse:  Cessation advised.  4.  PNA:  Now off of abx.  WBC coming down - had been on steroids.  Afebrile.  5.  Hyperglycemia:  SSI per IM.  Check A1c.  Signed, Christell Faith, PA-C [place] Pager: [phone] 10/29/2016, 7:32 AM   Attending Note Patient seen and examined, agree with detailed note above,  Patient presentation and plan discussed on rounds.   EKG lab work, chest x-ray, echocardiogram reviewed independently by myself  Feeling better, has not ambulated out of bed very much Shortness of breath  improving Reported having tightness around his abdomen and chest, this is improving as diuresis has progressed  On physical exam lungs clear to auscultation bilaterally, heart sounds regular, unable to estimate JVP, abdomen soft nontender, no significant lower extremity edema  --- Acute Systolic CHF Nonischemic, felt secondary to possible cocaine abuse Medications as above We'll change IV Lasix to 20 mg by mouth twice a day CHF education provided including daily weights, limiting fluid and salt intake, eating close follow-up in cardiology clinic in CHF clinic  --- Non-STEMI Nonobstructive disease, on catheterization Cocaine positive tox screen    Greater than 50% was spent in counseling and coordination of care with patient Total encounter time 25 minutes or more   Signed: Esmond Plants  M.D., Ph.D. [facility]

## 2016-10-29 NOTE — Care Management (Addendum)
Was able to initiate prior authorization with [employer]. [place]  Heart Failure Class is 3.  Application will be faxed to this CM ASAP per representative.  Provided patient with approval letter.  Approval Number [id number].  Placed on letter in the paper chart and provided patient with a copy to take to his pharmacy.  Patient has spoke of need for home nebulizer.  Per attending, patient is not going to require home nebulizer treatments.

## 2016-10-30 ENCOUNTER — Telehealth: Payer: Self-pay | Admitting: *Deleted

## 2016-10-30 LAB — BASIC METABOLIC PANEL
ANION GAP: 7 (ref 5–15)
BUN: 23 mg/dL — ABNORMAL HIGH (ref 6–20)
CALCIUM: 9.2 mg/dL (ref 8.9–10.3)
CO2: 28 mmol/L (ref 22–32)
Chloride: 104 mmol/L (ref 101–111)
Creatinine, Ser: 1.28 mg/dL — ABNORMAL HIGH (ref 0.61–1.24)
Glucose, Bld: 118 mg/dL — ABNORMAL HIGH (ref 65–99)
Potassium: 4.1 mmol/L (ref 3.5–5.1)
SODIUM: 139 mmol/L (ref 135–145)

## 2016-10-30 LAB — GLUCOSE, CAPILLARY: GLUCOSE-CAPILLARY: 125 mg/dL — AB (ref 65–99)

## 2016-10-30 MED ORDER — FUROSEMIDE 40 MG PO TABS
40.0000 mg | ORAL_TABLET | Freq: Two times a day (BID) | ORAL | Status: DC
  Administered 2016-10-30: 40 mg via ORAL
  Filled 2016-10-30: qty 1

## 2016-10-30 MED ORDER — FUROSEMIDE 40 MG PO TABS: 40.0000 mg | ORAL_TABLET | Freq: Two times a day (BID) | ORAL | 2 refills | Status: DC

## 2016-10-30 NOTE — Progress Notes (Addendum)
[place] at [place] was admitted to the Hospital on 10/24/2016 and Discharged  10/30/2016 and should be excused from work/school   for 21 days starting 10/24/2016 , until the patient see the PCP or cardiologist as outpatient.  Demetrios Loll M.D on 10/30/2016,at 1:30 PM  [place] at [facility]  [phone]

## 2016-10-30 NOTE — Care Management (Signed)
Called in discharge prescriptions because faxes were not going thru.  CM has reminded patient to activate his Entresto- monthly co pay coupon card.

## 2016-10-30 NOTE — Progress Notes (Signed)
Patient Name: Carlos Brewer. Date of Encounter: 10/30/2016  Primary Cardiologist: [facility] Problem List     Active Problems:   Pneumonia   Acute systolic heart failure (HCC)     Subjective   Breathing much improved. Not much UOP on Lasix 20 mg bid. Minus 6.3 L for the admission. BP on the soft side, though stable. Renal function mildly bumped, though stable.   Inpatient Medications    Scheduled Meds: . aspirin EC  81 mg Oral Daily  . atorvastatin  40 mg Oral q1800  . carvedilol  3.125 mg Oral BID WC  . enoxaparin (LOVENOX) injection  40 mg Subcutaneous Q24H  . fluticasone  1 spray Each Nare QHS  . furosemide  40 mg Oral BID  . insulin aspart  0-5 Units Subcutaneous QHS  . insulin aspart  0-9 Units Subcutaneous TID WC  . ipratropium-albuterol  3 mL Nebulization BID  . loratadine  10 mg Oral Daily  . mupirocin ointment  1 application Topical BID  . nicotine  21 mg Transdermal Daily  . sacubitril-valsartan  1 tablet Oral BID  . sertraline  25 mg Oral Daily  . sodium chloride flush  3 mL Intravenous Q12H  . sodium chloride flush  3 mL Intravenous Q12H  . spironolactone  12.5 mg Oral Daily  . traZODone  50 mg Oral QHS   Continuous Infusions: . sodium chloride     PRN Meds: sodium chloride, acetaminophen **OR** acetaminophen, alum & mag hydroxide-simeth, benzonatate, morphine injection, nitroGLYCERIN, oxyCODONE-acetaminophen, sodium chloride flush   Vital Signs    Vitals:   10/29/16 2149 10/30/16 0453 10/30/16 0500 10/30/16 0823  BP: 110/78 110/65  96/64  Pulse: 90 (!) 108  99  Resp:  18  14  Temp:  99.5 F (37.5 C)  98 F (36.7 C)  TempSrc:  Oral  Oral  SpO2:  100%  97%  Weight:   273 lb 4.8 oz (124 kg)   Height:        Intake/Output Summary (Last 24 hours) at 10/30/16 0930 Last data filed at 10/29/16 2030  Gross per 24 hour  Intake              120 ml  Output              400 ml  Net             -280 ml   Filed Weights   10/28/16  0330 10/29/16 0502 10/30/16 0500  Weight: 287 lb 6.4 oz (130.4 kg) 285 lb 1.6 oz (129.3 kg) 273 lb 4.8 oz (124 kg)    Physical Exam    GEN: Well nourished, well developed, in no acute distress.  HEENT: Grossly normal.  Neck: Supple, no JVD, carotid bruits, or masses. Cardiac: RRR, no murmurs, rubs, or gallops. No clubbing, cyanosis, edema.  Radials/DP/PT 2+ and equal bilaterally.  Respiratory:  Respirations regular and unlabored, clear to auscultation bilaterally. GI: Soft, nontender, nondistended, BS + x 4. MS: no deformity or atrophy. Skin: warm and dry, no rash. Neuro:  Strength and sensation are intact. Psych: AAOx3.  Normal affect.  Labs    CBC  Recent Labs  10/28/16 0619  WBC 12.5*  HGB 14.9  HCT 43.9  MCV 89.6  PLT 825   Basic Metabolic Panel  Recent Labs  10/29/16 0421 10/30/16 0444  NA 136 139  K 3.9 4.1  CL 101 104  CO2 26 28  GLUCOSE 114* 118*  BUN 21* 23*  CREATININE 1.29* 1.28*  CALCIUM 9.3 9.2   Liver Function Tests No results for input(s): AST, ALT, ALKPHOS, BILITOT, PROT, ALBUMIN in the last 72 hours. No results for input(s): LIPASE, AMYLASE in the last 72 hours. Cardiac Enzymes No results for input(s): CKTOTAL, CKMB, CKMBINDEX, TROPONINI in the last 72 hours. BNP Invalid input(s): POCBNP D-Dimer No results for input(s): DDIMER in the last 72 hours. Hemoglobin A1C No results for input(s): HGBA1C in the last 72 hours. Fasting Lipid Panel No results for input(s): CHOL, HDL, LDLCALC, TRIG, CHOLHDL, LDLDIRECT in the last 72 hours. Thyroid Function Tests No results for input(s): TSH, T4TOTAL, T3FREE, THYROIDAB in the last 72 hours.  Invalid input(s): FREET3  Telemetry    NSR - Personally Reviewed  ECG    n/a - Personally Reviewed  Radiology    No results found.  Cardiac Studies   TTE 10/24/2016: Study Conclusions  - Procedure narrative: Transthoracic echocardiography. The study was technically difficult. - Left ventricle:  The cavity size was moderately dilated. Wall thickness was normal. Systolic function was severely reduced. The estimated ejection fraction was in the range of 15% to 20%. Features are consistent with a pseudonormal left ventricular filling pattern, with concomitant abnormal relaxation and increased filling pressure (grade 2 diastolic dysfunction). - Left atrium: The atrium was mildly dilated. - Right atrium: The atrium was mildly dilated. - Pulmonary arteries: Systolic pressure could not be accurately estimated.  R/LHC 10/27/2016: Conclusion     There is severe left ventricular systolic dysfunction.  LV end diastolic pressure is severely elevated.  The left ventricular ejection fraction is less than 25% by visual estimate.  1. Minimal luminal irregularities with no evidence of obstructive coronary artery disease. 2. Severely reduced LV systolic function with an EF of 15% with global hypokinesis. 3. Right heart catheterization showed severely elevated filling pressures, moderate pulmonary hypertension and normal cardiac output. RA: 13 mmHg, RV: 59 over 18 mmHg, PA pressure: 51/33 with a mean of 42 mmHg, pulmonary capillary wedge pressure: 42 mmHg, left ventricular end-diastolic pressure was 47 mmHg. Cardiac output was 6.07 with a cardiac index of 2.46.  Recommendations: The patient has nonischemic cardiomyopathy with severely reduced LV systolic function. His filling pressures are severely elevated and I suspect that his infiltrates on x-ray are all due to pulmonary edema and heart failure. The patient will require at least 2-3 days of IV diuresis. I switched him to furosemide 40 mg intravenously twice daily. Continue other heart medications.     Patient Profile     31 y.o. male admitted 16/10 with a 2 wk h/o progressive dyspnea and acute resp failure, ? Pna, found to have severe LV dysfxn (15-20%), + UDS for cocaine, and relatively nl cors (cath 8/13).  Assessment  & Plan    1. Acute systolic CHF/NICM: 2-3 wk h/o progressive dyspnea despite outpt treatment for bilat PNA. Admitted 8/10 with acut resp failure. Initial CXR suggestive of worsening PNA. EF 15-20% by echo. Suspect CXR findings more likely pulmonary edema. [place] 8.13 showed relatively nl cors with PAH and PCWP of 42. Transitioned to PO Lasix 20 mg bid on 8/15 with patient reported decreased UOP. Minus 6.3L for the admission. Wts appear to be inaccurate and he will need to be stood. Renal fxn bumped though stable. Increase PO Lasix from 20 mg bid to 40 mg bid, would hold for decreasing weight and call for increasing weight. Cont ?blocker, entresto, spiro. BP's soft, though stable. Risks of cocaine abuse with  BB discussed. No room to add bidil right now. UDS + cocaine and cessation has been advised. He will need f/u echo in 40 days to determine candidacy for ICD therapy, assuming he is compliant with meds/lifestyle.  2. NSTEMI: Peak trop 5.38. Minimal CAD on cath. ? Role of cocaine induced coronary vasospasm vs demand ischemia in setting of severe CHF/NICM. Cont ASA. He is on carvedilol and we have previously discussed the dangers of cocaine usage in the setting of underlying ?blocker therapy. Continue statin with LDL 130 and some amt of coronary dzs.  3. Cocaine Abuse: Cessation advised.  4. PNA: Now off of abx. WBC coming down - had been on steroids. Afebrile.  5. Hyperglycemia: SSI per IM. Check A1c.  Signed, Christell Faith, PA-C [place] Pager: [phone] 10/30/2016, 9:30 AM   Attending Note Patient seen and examined, agree with detailed note above,  Patient presentation and plan discussed on rounds.   Reports feeling well, ambulating without significant shortness of breath Does not feel that Lasix 20 twice a day was strong enough, reports that he did not have much urine output, feels like he needs higher dose Tightness in chest has resolved even  when supine  On physical exam  JVP 10+ lungs clear to auscultation bilaterally,  heart sounds regular,  abdomen soft nontender, no significant lower extremity edema  Creatinine stable 1.28, BUN 23  A/P --- Acute Systolic CHF Nonischemic, felt secondary to possible cocaine abuse Again, CHF education provided including daily weights, limiting fluid and salt intake, eating close follow-up in cardiology clinic in CHF clinic He may benefit from cardiac rehabilitation We'll increase Lasix up to 40 twice a day Continue carvedilol, entresto, Aldactone  --- Non-STEMI Nonobstructive disease, on catheterization Cocaine positive tox screen   Long discussion with patient concerning titrating of diuretics, close monitoring of weight Greater than 50% was spent in counseling and coordination of care with patient Total encounter time 35 minutes or more   Signed: Esmond Plants  M.D., Ph.D. [facility]

## 2016-10-30 NOTE — Telephone Encounter (Signed)
Left detailed voicemail message per release form for patient to call back if he should have any questions regarding his discharge instructions or medications with appointment information and our return number.

## 2016-10-30 NOTE — Care Management (Signed)
Contacted [facility] in [place] and informed does not have Entresto in stock and unable to identify how long it would take to get a supply.  [place] has the  medication.  Manually faxed all of his discharge meds to [facility] per patient request.

## 2016-10-30 NOTE — Telephone Encounter (Signed)
-----   Message from Blain Pais sent at 10/30/2016  8:13 AM EDT ----- Regarding: tcm/ph 8/30 8am Christell Faith, PA

## 2016-10-30 NOTE — Plan of Care (Signed)
Problem: Education: Goal: Ability to demonstrate managment of disease process will improve Outcome: Completed/Met Date Met: 10/30/16 Reviewed better living with CHF.  Reviewed medications and their indications.  Stressed follow up appointments

## 2016-10-30 NOTE — Discharge Summary (Signed)
[place] [facility] [place] NAME: Carlos Brewer    MR#:  [id number]  DATE OF BIRTH:  28-Sep-1985  DATE OF ADMISSION:  10/24/2016   ADMITTING PHYSICIAN: Loletha Grayer, MD  DATE OF DISCHARGE: 10/30/2016 PRIMARY CARE PHYSICIAN: Patient, No Pcp Per   ADMISSION DIAGNOSIS:  Acute infective myocarditis, due to unspecified organism [I40.0] DISCHARGE DIAGNOSIS:  Active Problems:   Pneumonia   Acute systolic heart failure ([place])  SECONDARY DIAGNOSIS:   Past Medical History:  Diagnosis Date  . Community acquired pneumonia    a. Bilat - dx 10/12/2016.  . Morbid obesity ([place])   . Palpitations   . Tobacco abuse    HOSPITAL COURSE:  1. Acute systolic CHF secondary to nonischemic cardiomyopathycardiomyopathy:  Echo this admission with moderate LV dilation, EF 15-20%.  Per Dr. Aundra Dubin, CXR with bilateral airspace disease, may actually be all pulmonary edema rather than PNA.  require at least 2-3 days of IV lasix per Dr. Fletcher Anon. - Continue low dose Coreg and spironolactone 12.5 mg daily.  Started Entresto and discontined losartan to 12.5 mg bid. Per Dr. Saunders Revel, he will likely need cardiac MRI and referral to the advanced heart failure clinic for further assessment and management of his nonischemic cardiomyopathy. Hold lasix due to elevated Creatinine. Started by mouth Lasix 40 mg po bid since creatinine is stable.  2.  NSTEMI Cardiac cath:  1. Minimal luminal irregularities with no evidence of obstructive coronary artery disease. 2. Severely reduced LV systolic function with an EF of 15% with global hypokinesis. 3. Right heart catheterization showed severely elevated filling pressures, moderate pulmonary hypertension and normal cardiac output. Off heparin drip, continue ASA and Lipitor.   3. Bilateral pulmonary edema, not pneumonia.  He was on outpatient Augmentin and doxycycline.  Discontinued IV Rocephin and Zithromax. Pro calcitonin are negative  which is against bacterial pneumonia. .  Not COPD exacerbation. Discontinued IV Solu-Medrol, continue budesonide, Continue DuoNeb nebulizers.  Leukocytosis. Possible due to steroids. Improving.  4. Tobacco abuse smoking cessation counseling done 4 minutes. 5. Cocaine abuse. Urine toxicology showed positive cocaine.  Per psych consult,  Dr. Einar Grad, he need to call the substance abuse program [facility] once he is discharged. Number to call [phone]  Hyperlipidemia. Continue Lipitor. LDL 131. Morbid obesity. pending hemoglobin A1c.  PTSD.  stared Zoloft at 25 mg once daily and trazodone 50 mg at bedtime to help him sleep.  DISCHARGE CONDITIONS:  Stable, discharge to home today. CONSULTS OBTAINED:  Treatment Team:  Wellington Hampshire, MD Elvin So, MD DRUG ALLERGIES:  No Known Allergies DISCHARGE MEDICATIONS:   Allergies as of 10/30/2016   No Known Allergies     Medication List    STOP taking these medications   amoxicillin-clavulanate 875-125 MG tablet Commonly known as:  AUGMENTIN   benzonatate 100 MG capsule Commonly known as:  TESSALON   doxycycline 100 MG capsule Commonly known as:  VIBRAMYCIN     TAKE these medications   acetaminophen 500 MG tablet Commonly known as:  TYLENOL Take 2 tablets (1,000 mg total) by mouth every 6 (six) hours as needed.   aspirin 81 MG EC tablet Take 1 tablet (81 mg total) by mouth daily.   atorvastatin 40 MG tablet Commonly known as:  LIPITOR Take 1 tablet (40 mg total) by mouth daily at 6 PM.   carvedilol 3.125 MG tablet Commonly known as:  COREG Take 1 tablet (3.125 mg total) by mouth 2 (two)  times daily with a meal.   fluticasone 50 MCG/ACT nasal spray Commonly known as:  FLONASE Place 1 spray into both nostrils 2 (two) times daily.   furosemide 40 MG tablet Commonly known as:  LASIX Take 1 tablet (40 mg total) by mouth 2 (two) times daily.   loratadine 10 MG tablet Commonly known as:   CLARITIN Take 1 tablet (10 mg total) by mouth daily.   mupirocin ointment 2 % Commonly known as:  BACTROBAN Apply 1 application topically 2 (two) times daily.   nitroGLYCERIN 0.4 MG SL tablet Commonly known as:  NITROSTAT Place 1 tablet (0.4 mg total) under the tongue every 5 (five) minutes as needed for chest pain.   sacubitril-valsartan 24-26 MG Commonly known as:  ENTRESTO Take 1 tablet by mouth 2 (two) times daily.   sertraline 25 MG tablet Commonly known as:  ZOLOFT Take 1 tablet (25 mg total) by mouth daily.   sodium chloride 0.65 % Soln nasal spray Commonly known as:  OCEAN Place 2 sprays into both nostrils every 2 (two) hours while awake.   spironolactone 25 MG tablet Commonly known as:  ALDACTONE Take 0.5 tablets (12.5 mg total) by mouth daily.   traZODone 50 MG tablet Commonly known as:  DESYREL Take 1 tablet (50 mg total) by mouth at bedtime.        DISCHARGE INSTRUCTIONS:  See AVS.  If you experience worsening of your admission symptoms, develop shortness of breath, life threatening emergency, suicidal or homicidal thoughts you must seek medical attention immediately by calling 911 or calling your MD immediately  if symptoms less severe.  You Must read complete instructions/literature along with all the possible adverse reactions/side effects for all the Medicines you take and that have been prescribed to you. Take any new Medicines after you have completely understood and accpet all the possible adverse reactions/side effects.   Please note  You were cared for by a hospitalist during your hospital stay. If you have any questions about your discharge medications or the care you received while you were in the hospital after you are discharged, you can call the unit and asked to speak with the hospitalist on call if the hospitalist that took care of you is not available. Once you are discharged, your primary care physician will handle any further medical issues.  Please note that NO REFILLS for any discharge medications will be authorized once you are discharged, as it is imperative that you return to your primary care physician (or establish a relationship with a primary care physician if you do not have one) for your aftercare needs so that they can reassess your need for medications and monitor your lab values.    On the day of Discharge:  VITAL SIGNS:  Blood pressure 96/64, pulse 99, temperature 98 F (36.7 C), temperature source Oral, resp. rate 14, height 6' (1.829 m), weight 273 lb 4.8 oz (124 kg), SpO2 97 %. PHYSICAL EXAMINATION:  GENERAL:  31 y.o.-year-old patient lying in the bed with no acute distress. Morbid obesity. EYES: Pupils equal, round, reactive to light and accommodation. No scleral icterus. Extraocular muscles intact.  HEENT: Head atraumatic, normocephalic. Oropharynx and nasopharynx clear.  NECK:  Supple, no jugular venous distention. No thyroid enlargement, no tenderness.  LUNGS: Normal breath sounds bilaterally, no wheezing, rales,rhonchi or crepitation. No use of accessory muscles of respiration.  CARDIOVASCULAR: S1, S2 normal. No murmurs, rubs, or gallops.  ABDOMEN: Soft, non-tender, non-distended. Bowel sounds present. No organomegaly or mass.  EXTREMITIES: No pedal edema, cyanosis, or clubbing.  NEUROLOGIC: Cranial nerves II through XII are intact. Muscle strength 5/5 in all extremities. Sensation intact. Gait not checked.  PSYCHIATRIC: The patient is alert and oriented x 3.  SKIN: No obvious rash, lesion, or ulcer.  DATA REVIEW:   CBC  Recent Labs Lab 10/28/16 0619  WBC 12.5*  HGB 14.9  HCT 43.9  PLT 289    Chemistries   Recent Labs Lab 10/26/16 0444  10/30/16 0444  NA 140  < > 139  K 3.5  < > 4.1  CL 104  < > 104  CO2 29  < > 28  GLUCOSE 238*  < > 118*  BUN 16  < > 23*  CREATININE 1.12  < > 1.28*  CALCIUM 9.1  < > 9.2  MG 2.1  --   --   < > = values in this interval not displayed.   Microbiology  Results  Results for orders placed or performed during the hospital encounter of 10/24/16  Blood Culture (routine x 2)     Status: None   Collection Time: 10/24/16 11:18 AM  Result Value Ref Range Status   Specimen Description BLOOD RIGHT ANTECUBITAL  Final   Special Requests   Final    BOTTLES DRAWN AEROBIC AND ANAEROBIC Blood Culture results may not be optimal due to an excessive volume of blood received in culture bottles   Culture NO GROWTH 5 DAYS  Final   Report Status 10/29/2016 FINAL  Final  Blood Culture (routine x 2)     Status: None   Collection Time: 10/24/16 11:18 AM  Result Value Ref Range Status   Specimen Description BLOOD BLOOD LEFT HAND  Final   Special Requests   Final    BOTTLES DRAWN AEROBIC AND ANAEROBIC Blood Culture results may not be optimal due to an excessive volume of blood received in culture bottles   Culture NO GROWTH 5 DAYS  Final   Report Status 10/29/2016 FINAL  Final    RADIOLOGY:  No results found.   Management plans discussed with the patient, family and they are in agreement.  CODE STATUS: Full Code   TOTAL TIME TAKING CARE OF THIS PATIENT: 37 minutes.    Demetrios Loll M.D on 10/30/2016 at 9:56 AM  Between 7am to 6pm - Pager - [phone]  After 6pm go to [email or web address] - Proofreader  [facility]  Office  [phone]  CC: Primary care physician; Patient, No Pcp Per   Note: This dictation was prepared with Dragon dictation along with smaller phrase technology. Any transcriptional errors that result from this process are unintentional.

## 2016-10-31 ENCOUNTER — Telehealth: Payer: Self-pay | Admitting: Cardiovascular Disease

## 2016-10-31 NOTE — Telephone Encounter (Signed)
No answer/Voicemail box is full.  

## 2016-10-31 NOTE — Telephone Encounter (Signed)
PT came by to schedule a HFU for heart attack Was scheduled with PA but prefers to see [facility] Scheduled next available for 8/24 PT is interested in Cardiac Rehab

## 2016-10-31 NOTE — Telephone Encounter (Signed)
Patient dropped off fmla forms.  Signed roi and submitted $25 money order. Sent to [facility] via interoffice mail.

## 2016-11-04 ENCOUNTER — Ambulatory Visit: Payer: Commercial Managed Care - PPO | Admitting: Family

## 2016-11-05 ENCOUNTER — Telehealth: Payer: Self-pay | Admitting: Cardiovascular Disease

## 2016-11-05 ENCOUNTER — Encounter: Payer: Self-pay | Admitting: Family

## 2016-11-05 ENCOUNTER — Ambulatory Visit: Payer: Commercial Managed Care - PPO | Attending: Family | Admitting: Family

## 2016-11-05 DIAGNOSIS — F1490 Cocaine use, unspecified, uncomplicated: Secondary | ICD-10-CM | POA: Diagnosis not present

## 2016-11-05 DIAGNOSIS — I272 Pulmonary hypertension, unspecified: Secondary | ICD-10-CM | POA: Insufficient documentation

## 2016-11-05 DIAGNOSIS — Z87891 Personal history of nicotine dependence: Secondary | ICD-10-CM | POA: Insufficient documentation

## 2016-11-05 DIAGNOSIS — F419 Anxiety disorder, unspecified: Secondary | ICD-10-CM | POA: Insufficient documentation

## 2016-11-05 DIAGNOSIS — I5042 Chronic combined systolic (congestive) and diastolic (congestive) heart failure: Secondary | ICD-10-CM | POA: Insufficient documentation

## 2016-11-05 DIAGNOSIS — I214 Non-ST elevation (NSTEMI) myocardial infarction: Secondary | ICD-10-CM | POA: Insufficient documentation

## 2016-11-05 DIAGNOSIS — I5022 Chronic systolic (congestive) heart failure: Secondary | ICD-10-CM

## 2016-11-05 DIAGNOSIS — Z7982 Long term (current) use of aspirin: Secondary | ICD-10-CM | POA: Diagnosis not present

## 2016-11-05 DIAGNOSIS — E8771 Transfusion associated circulatory overload: Secondary | ICD-10-CM | POA: Diagnosis not present

## 2016-11-05 DIAGNOSIS — F1990 Other psychoactive substance use, unspecified, uncomplicated: Secondary | ICD-10-CM | POA: Insufficient documentation

## 2016-11-05 DIAGNOSIS — F4310 Post-traumatic stress disorder, unspecified: Secondary | ICD-10-CM | POA: Diagnosis not present

## 2016-11-05 LAB — BASIC METABOLIC PANEL
Anion gap: 11 (ref 5–15)
BUN: 18 mg/dL (ref 6–20)
CO2: 26 mmol/L (ref 22–32)
CREATININE: 1.01 mg/dL (ref 0.61–1.24)
Calcium: 9.7 mg/dL (ref 8.9–10.3)
Chloride: 104 mmol/L (ref 101–111)
Glucose, Bld: 115 mg/dL — ABNORMAL HIGH (ref 65–99)
POTASSIUM: 3.9 mmol/L (ref 3.5–5.1)
SODIUM: 141 mmol/L (ref 135–145)

## 2016-11-05 NOTE — Progress Notes (Signed)
Patient ID: Carlos Brewer., male    DOB: [birth year], 31 y.o.   MRN: [id number]  HPI  Carlos Brewer is a 31 y/o male with a history of PTSD, anxiety, previous tobacco use, recent cocaine use and chronic heart failure.  Echo report from 10/24/16 reviewed and shows an EF of 15-20%. Cardiac catheterization was done 10/27/16 and showed an EF of 25% along with LV end diastolic pressure being severely elevated with moderate pulmonary HTN.   Admitted 10/24/16 due to HF. Cardiology consult obtained. Initially needed IV diuretics and then transitioned to oral diuretics. Right/Left cardiac catheterization done due to NSTEMI without evidence of obstructive CAD. Psychiatry consult also obtained due to PTSD and cocaine use. Discharged home after 6 days. Was in the ED 5 times from 06/01/16-10/20/16 with various issues.   He presents today for his initial visit without a chief complaint. He denies any fatigue, shortness of breath, chest pain, edema or dizziness. Did have an overnight weight gain of 3 pounds after eating nachos bellgrande, taco, BBQ ribs and some steak all in one day.  Past Medical History:  Diagnosis Date  . CHF (congestive heart failure) ([place])   . Community acquired pneumonia    a. Bilat - dx 10/12/2016.  . Morbid obesity ([place])   . Palpitations   . Tobacco abuse    Past Surgical History:  Procedure Laterality Date  . CORONARY ANGIOPLASTY    . RIGHT/LEFT HEART CATH AND CORONARY ANGIOGRAPHY N/A 10/27/2016   Procedure: RIGHT/LEFT HEART CATH AND CORONARY ANGIOGRAPHY;  Surgeon: Wellington Hampshire, MD;  Location: [facility];  Service: Cardiovascular;  Laterality: N/A;  . WISDOM TOOTH EXTRACTION  2008   Family History  Problem Relation Age of Onset  . Healthy Mother   . Healthy Father    Social History  Substance Use Topics  . Smoking status: Former Smoker    Packs/day: 0.25    Types: Cigarettes    Quit date: 10/23/2016  . Smokeless tobacco: Never Used  . Alcohol use No      Comment: none   No Known Allergies Prior to Admission medications   Medication Sig Start Date End Date Taking? Authorizing Provider  acetaminophen (TYLENOL) 500 MG tablet Take 2 tablets (1,000 mg total) by mouth every 6 (six) hours as needed. 10/12/16  Yes Betancourt, Aura Fey, NP  aspirin EC 81 MG EC tablet Take 1 tablet (81 mg total) by mouth daily. 10/30/16  Yes Demetrios Loll, MD  atorvastatin (LIPITOR) 40 MG tablet Take 1 tablet (40 mg total) by mouth daily at 6 PM. 10/29/16  Yes Demetrios Loll, MD  carvedilol (COREG) 3.125 MG tablet Take 1 tablet (3.125 mg total) by mouth 2 (two) times daily with a meal. 10/29/16  Yes Demetrios Loll, MD  fluticasone [facility]) 50 MCG/ACT nasal spray Place 1 spray into both nostrils 2 (two) times daily. 10/12/16  Yes Betancourt, Aura Fey, NP  furosemide (LASIX) 40 MG tablet Take 1 tablet (40 mg total) by mouth 2 (two) times daily. 10/30/16  Yes Demetrios Loll, MD  loratadine (CLARITIN) 10 MG tablet Take 1 tablet (10 mg total) by mouth daily. 10/12/16  Yes Betancourt, Aura Fey, NP  mupirocin ointment (BACTROBAN) 2 % Apply 1 application topically 2 (two) times daily. 10/20/16  Yes Frederich Cha, MD  nitroGLYCERIN (NITROSTAT) 0.4 MG SL tablet Place 1 tablet (0.4 mg total) under the tongue every 5 (five) minutes as needed for chest pain. 10/29/16  Yes Demetrios Loll, MD  sacubitril-valsartan (  ENTRESTO) 24-26 MG Take 1 tablet by mouth 2 (two) times daily. 10/29/16  Yes Demetrios Loll, MD  sertraline (ZOLOFT) 25 MG tablet Take 1 tablet (25 mg total) by mouth daily. 10/30/16  Yes Demetrios Loll, MD  sodium chloride (OCEAN) 0.65 % SOLN nasal spray Place 2 sprays into both nostrils every 2 (two) hours while awake. 10/12/16 11/11/16 Yes Betancourt, Aura Fey, NP  spironolactone (ALDACTONE) 25 MG tablet Take 25 mg by mouth daily.   Yes [provider]  traZODone (DESYREL) 50 MG tablet Take 1 tablet (50 mg total) by mouth at bedtime. 10/29/16  Yes Demetrios Loll, MD   Review of Systems  Constitutional: Negative for  appetite change and fatigue.  HENT: Negative for congestion, postnasal drip and sore throat.   Eyes: Negative.   Respiratory: Negative for cough, chest tightness and shortness of breath.   Cardiovascular: Negative for chest pain and palpitations.  Gastrointestinal: Negative for abdominal distention and abdominal pain.  Endocrine: Negative.   Genitourinary: Negative.   Musculoskeletal: Negative for back pain and neck pain.  Skin: Negative.   Allergic/Immunologic: Negative.   Neurological: Negative for dizziness and light-headedness.  Hematological: Negative for adenopathy. Does not bruise/bleed easily.  Psychiatric/Behavioral: Negative for dysphoric mood and sleep disturbance (sleeping better). The patient is not nervous/anxious.    Vitals:   11/05/16 1020  BP: 110/80  Pulse: 79  Resp: 18  SpO2: 98%  Weight: 278 lb 8 oz (126.3 kg)  Height: 6' (1.829 m)   Wt Readings from Last 3 Encounters:  11/05/16 278 lb 8 oz (126.3 kg)  10/30/16 273 lb 4.8 oz (124 kg)  10/12/16 280 lb (127 kg)   Lab Results  Component Value Date   CREATININE 1.01 11/05/2016   CREATININE 1.28 (H) 10/30/2016   CREATININE 1.29 (H) 10/29/2016   Physical Exam  Constitutional: He is oriented to person, place, and time. He appears well-developed and well-nourished.  HENT:  Head: Normocephalic and atraumatic.  Neck: Normal range of motion. Neck supple. No JVD present.  Cardiovascular: Normal rate and regular rhythm.   Pulmonary/Chest: Effort normal. He has no wheezes. He has no rales.  Abdominal: Soft. He exhibits no distension. There is no tenderness.  Musculoskeletal: He exhibits no edema or tenderness.  Neurological: He is alert and oriented to person, place, and time.  Skin: Skin is warm and dry.  Psychiatric: He has a normal mood and affect. His behavior is normal. Thought content normal.  Nursing note and vitals reviewed.  Assessment & Plan:  1: Chronic heart failure with reduced ejection  fraction- - NYHA class I - euvolemic today - weighing daily with recent overnight weight gain of 3 pounds. Instructed to call for an overnight weight gain of >2 pounds or a weekly weight gain of >5 pounds - not adding salt but did eat quite a bit of salty foods the day prior (nachos, taco, BBQ ribs & steak). Discussed the importance of closely following a 2000mg  sodium diet and written dietary information was given to him about this - unsure of fluid intake, says that he was told to drink until he's not thirsty anymore. Instructed to maintain a fluid intake of 60 ounces of fluid daily - looking into get a bicycle for cardio as he says that he doesn't enjoy running - discussed increasing entresto/carvedilol in the future but may need to back off on diuretic when doing so due to BP - patient increased his spironolactone to 25mg  daily on his own because he felt  like the diuretic wasn't doing its job. Explained that all the extra fluid may be gone which is why he's not urinating like he previously was. Also discussed the importance of not adjusting medications on his own.  - will check a BMP today.  - PharmD went in and reviewed medications with the patient - has an appointment with cardiologist Carlos Brewer) 11/07/16 - does not meet ReDS vest criteria as patient is currently asymptomatic  2: Anxiety- - appears to be stable at this time - on sertaline and trazodone  3: Substance use- - recently tested positive for cocaine on 10/24/16; says he's no longer doing drugs - quit smoking January 2018  Patient did not bring his medications nor a list. Each medication was verbally reviewed with the patient and he was encouraged to bring the bottles to every visit to confirm accuracy of list.  Return in 1 month or sooner for any questions/problems before then.

## 2016-11-05 NOTE — Telephone Encounter (Signed)
Coverage Determination Request Form from [facility] Rx for Entresto completed and faxed to [phone]

## 2016-11-05 NOTE — Patient Instructions (Signed)
Continue weighing daily and call for an overnight weight gain of > 2 pounds or a weekly weight gain of >5 pounds. 

## 2016-11-06 ENCOUNTER — Telehealth: Payer: Self-pay | Admitting: Family

## 2016-11-06 NOTE — Telephone Encounter (Signed)
LM on voicemail that labs drawn yesterday (11/05/16) showed normal potassium and renal function.

## 2016-11-06 NOTE — Progress Notes (Signed)
Cardiology Office Note  Date:  11/07/2016   ID:  Carlos Brewer., DOB [birth year], MRN [id number]  PCP:  Patient, No Pcp Per   Chief Complaint  Patient presents with  . other    Follow up [name];  s/p cardiac cath & acute infective myocarditis. Pt. c/o while running around at a youths obstacle course, felt very fatigue, lightheaded and had shortness of breath.     HPI:  Carlos Brewer. is a 31 y.o. male  Hx of cocaine smoker Morbid obesity Cardiomyopathy, EF 15% CATH NONOBSTRUCTIVE DISEASE 10/27/16  D/c from hospital with lasix 40 BID Was previously on Lasix 20 twice a day prior to hospitalization Feels he is not eating enough Dry Weight at home 271 and 275 Cutting back on his meals out and fluid intake Tolerating current medications   went to all of his medications, denies any significant side effects Tested himself out, ran an obstacle course, had significant lightheadedness after short sprint Took him short time to recover  Mother involved in his care, presents with him today  EKG personally reviewed by myself on todays visit Normal sinus rhythm with rate 79 bpm no significant ST or T-wave changes  PMH:   has a past medical history of Acute infective myocarditis (10/2016); CHF (congestive heart failure) ([place]); Chronic CHF (congestive heart failure) ([place]); Community acquired pneumonia; Morbid obesity ([place]); Palpitations; and Tobacco abuse.  PSH:    Past Surgical History:  Procedure Laterality Date  . CORONARY ANGIOPLASTY    . RIGHT/LEFT HEART CATH AND CORONARY ANGIOGRAPHY N/A 10/27/2016   Procedure: RIGHT/LEFT HEART CATH AND CORONARY ANGIOGRAPHY;  Surgeon: Wellington Hampshire, MD;  Location: [facility];  Service: Cardiovascular;  Laterality: N/A;  . WISDOM TOOTH EXTRACTION  2008    Current Outpatient Prescriptions  Medication Sig Dispense Refill  . acetaminophen (TYLENOL) 500 MG tablet Take 2 tablets (1,000 mg total) by mouth every 6 (six) hours as  needed. 30 tablet 0  . aspirin EC 81 MG EC tablet Take 1 tablet (81 mg total) by mouth daily. 30 tablet 2  . atorvastatin (LIPITOR) 40 MG tablet Take 1 tablet (40 mg total) by mouth daily at 6 PM. 30 tablet 2  . carvedilol (COREG) 3.125 MG tablet Take 1 tablet (3.125 mg total) by mouth 2 (two) times daily with a meal. 60 tablet 2  . fluticasone (FLONASE) 50 MCG/ACT nasal spray Place 1 spray into both nostrils 2 (two) times daily. 16 g 0  . loratadine (CLARITIN) 10 MG tablet Take 1 tablet (10 mg total) by mouth daily. 30 tablet 1  . mupirocin ointment (BACTROBAN) 2 % Apply 1 application topically 2 (two) times daily. 22 g 0  . nitroGLYCERIN (NITROSTAT) 0.4 MG SL tablet Place 1 tablet (0.4 mg total) under the tongue every 5 (five) minutes as needed for chest pain. 30 tablet 2  . sacubitril-valsartan (ENTRESTO) 24-26 MG Take 1 tablet by mouth 2 (two) times daily. 60 tablet 0  . sertraline (ZOLOFT) 25 MG tablet Take 1 tablet (25 mg total) by mouth daily. 30 tablet 0  . spironolactone (ALDACTONE) 25 MG tablet Take 25 mg by mouth daily.    . traZODone (DESYREL) 50 MG tablet Take 1 tablet (50 mg total) by mouth at bedtime. 30 tablet 0  . torsemide (DEMADEX) 20 MG tablet Take 2 tablets (40 mg total) by mouth 2 (two) times daily. 120 tablet 7   No current facility-administered medications for this visit.  Allergies:   Patient has no known allergies.   Social History:  The patient  reports that he quit smoking about 2 weeks ago. His smoking use included Cigarettes. He smoked 0.25 packs per day. He has never used smokeless tobacco. He reports that he does not drink alcohol or use drugs.   Family History:   family history includes Healthy in his father and mother.    Review of Systems: Review of Systems  Constitutional: Negative.   Respiratory: Positive for shortness of breath.   Cardiovascular: Negative.   Gastrointestinal: Negative.   Musculoskeletal: Negative.   Neurological: Positive for  dizziness.  Psychiatric/Behavioral: Negative.   All other systems reviewed and are negative.    PHYSICAL EXAM: VS:  BP 92/60 (BP Location: Left Arm, Patient Position: Sitting, Cuff Size: Large)   Pulse 77   Ht 6' (1.829 m)   Wt 280 lb 4 oz (127.1 kg)   BMI 38.01 kg/m  , BMI Body mass index is 38.01 kg/m. GEN: Well nourished, well developed, in no acute distress , obese HEENT: normal  Neck: no JVD, carotid bruits, or masses Cardiac: RRR; no murmurs, rubs, or gallops,no edema  Respiratory:  clear to auscultation bilaterally, normal work of breathing GI: soft, nontender, nondistended, + BS MS: no deformity or atrophy  Skin: warm and dry, no rash Neuro:  Strength and sensation are intact Psych: euthymic mood, full affect    Recent Labs: 06/23/2016: TSH 1.247 10/24/2016: B Natriuretic Peptide 676.0 10/26/2016: Magnesium 2.1 10/28/2016: Hemoglobin 14.9; Platelets 289 11/05/2016: BUN 18; Creatinine, Ser 1.01; Potassium 3.9; Sodium 141    Lipid Panel Lab Results  Component Value Date   CHOL 213 (H) 10/26/2016   HDL 62 10/26/2016   LDLCALC 130 (H) 10/26/2016   TRIG 106 10/26/2016      Wt Readings from Last 3 Encounters:  11/07/16 280 lb 4 oz (127.1 kg)  11/05/16 278 lb 8 oz (126.3 kg)  10/30/16 273 lb 4.8 oz (124 kg)       ASSESSMENT AND PLAN:  Chronic systolic heart failure (HCC) -  We have recommended he stop Lasix, start torsemide 40 twice a day Recommended he call us for any significant weight change By our notes weight is up 7 pounds since his hospital discharge He reports it has been stable  Substance use disorder - Plan: EKG 12-Lead, AMB referral to cardiac rehabilitation Strongly recommended he avoid cocaine and other drugs of abuse Discussed effect on the heart Also talked about alcohol   Nonischemic cardiomyopathy ([place]) We will repeat echo in follow-up visit 3 months Unable to advance his medications at this time given low blood pressure Stressed  importance of watching his weight  Disposition:   F/U  3 months   Total encounter time more than 45 minutes  Greater than 50% was spent in counseling and coordination of care with the patient    Orders Placed This Encounter  Procedures  . AMB referral to cardiac rehabilitation  . EKG 12-Lead     Signed, Esmond Plants, M.D., Ph.D. 11/07/2016  [place], [place]

## 2016-11-07 ENCOUNTER — Telehealth: Payer: Self-pay | Admitting: Cardiovascular Disease

## 2016-11-07 ENCOUNTER — Encounter: Payer: Self-pay | Admitting: Cardiovascular Disease

## 2016-11-07 ENCOUNTER — Ambulatory Visit (INDEPENDENT_AMBULATORY_CARE_PROVIDER_SITE_OTHER): Payer: Commercial Managed Care - PPO | Admitting: Cardiovascular Disease

## 2016-11-07 VITALS — BP 92/60 | HR 77 | Ht 72.0 in | Wt 280.2 lb

## 2016-11-07 DIAGNOSIS — I5022 Chronic systolic (congestive) heart failure: Secondary | ICD-10-CM

## 2016-11-07 DIAGNOSIS — F1990 Other psychoactive substance use, unspecified, uncomplicated: Secondary | ICD-10-CM

## 2016-11-07 DIAGNOSIS — I428 Other cardiomyopathies: Secondary | ICD-10-CM | POA: Diagnosis not present

## 2016-11-07 MED ORDER — TORSEMIDE 20 MG PO TABS: 40.0000 mg | ORAL_TABLET | Freq: Two times a day (BID) | ORAL | 7 refills | Status: DC

## 2016-11-07 NOTE — Telephone Encounter (Signed)
Left voicemail message to call back. Discussed with Dr. Rockey Situ and recommendations are to check with patients PCP for referral for evaluation.

## 2016-11-07 NOTE — Patient Instructions (Addendum)
Medication Instructions:   Please stop lasix Change to torsemide 40 mg twice a day Eat banana  Labwork:  No new labs needed  Testing/Procedures:  No further testing at this time  We have placed a referral for cardiac rehab. They will call in about a week or so to schedule this. Their number is [phone] if you would like to call and check the status.   Follow-Up: It was a pleasure seeing you in the office today. Please call us if you have new issues that need to be addressed before your next appt.  [phone]  Your physician wants you to follow-up in: 3 months.  You will receive a reminder letter in the mail two months in advance. If you don't receive a letter, please call our office to schedule the follow-up appointment.  If you need a refill on your cardiac medications before your next appointment, please call your pharmacy.

## 2016-11-07 NOTE — Telephone Encounter (Signed)
Pt states primary had diagnosed him 6 years ago with sleep apnea He states he had a sleep study 6 years ago, unsure of name it was taken place but remembers it on [place] He would like a referral to have another sleep study  Please call to advise

## 2016-11-09 ENCOUNTER — Emergency Department: Payer: Commercial Managed Care - PPO

## 2016-11-09 ENCOUNTER — Emergency Department
Admission: EM | Admit: 2016-11-09 | Discharge: 2016-11-09 | Disposition: A | Discharge status: Home / Self Care | Payer: Commercial Managed Care - PPO | Attending: Emergency Medicine | Admitting: Emergency Medicine

## 2016-11-09 DIAGNOSIS — Z87891 Personal history of nicotine dependence: Secondary | ICD-10-CM | POA: Insufficient documentation

## 2016-11-09 DIAGNOSIS — F1410 Cocaine abuse, uncomplicated: Secondary | ICD-10-CM | POA: Insufficient documentation

## 2016-11-09 DIAGNOSIS — Z79899 Other long term (current) drug therapy: Secondary | ICD-10-CM | POA: Diagnosis not present

## 2016-11-09 DIAGNOSIS — Z7982 Long term (current) use of aspirin: Secondary | ICD-10-CM | POA: Diagnosis not present

## 2016-11-09 DIAGNOSIS — I5022 Chronic systolic (congestive) heart failure: Secondary | ICD-10-CM | POA: Insufficient documentation

## 2016-11-09 DIAGNOSIS — R079 Chest pain, unspecified: Secondary | ICD-10-CM | POA: Insufficient documentation

## 2016-11-09 LAB — TROPONIN I
Troponin I: 0.04 ng/mL (ref <0.03)
Troponin I: 0.03 ng/mL (ref <0.03)
Troponin I: 0.03 ng/mL (ref <0.03)

## 2016-11-09 LAB — URINE DRUG SCREEN, QUALITATIVE (ARMC ONLY)
Amphetamines, Ur Screen: NOT DETECTED
Barbiturates, Ur Screen: NOT DETECTED
Benzodiazepine, Ur Scrn: NOT DETECTED
CANNABINOID 50 NG, UR ~~LOC~~: NOT DETECTED
COCAINE METABOLITE, UR ~~LOC~~: POSITIVE — AB
MDMA (ECSTASY) UR SCREEN: NOT DETECTED
Methadone Scn, Ur: NOT DETECTED
OPIATE, UR SCREEN: NOT DETECTED
PHENCYCLIDINE (PCP) UR S: NOT DETECTED
Tricyclic, Ur Screen: NOT DETECTED

## 2016-11-09 LAB — CBC
HEMATOCRIT: 36.7 % — AB (ref 40.0–52.0)
HEMOGLOBIN: 13 g/dL (ref 13.0–18.0)
MCH: 31.4 pg (ref 26.0–34.0)
MCHC: 35.6 g/dL (ref 32.0–36.0)
MCV: 88.2 fL (ref 80.0–100.0)
Platelets: 264 10*3/uL (ref 150–440)
RBC: 4.16 MIL/uL — ABNORMAL LOW (ref 4.40–5.90)
RDW: 12.5 % (ref 11.5–14.5)
WBC: 7.8 10*3/uL (ref 3.8–10.6)

## 2016-11-09 LAB — BASIC METABOLIC PANEL
Anion gap: 8 (ref 5–15)
BUN: 21 mg/dL — AB (ref 6–20)
CALCIUM: 9.4 mg/dL (ref 8.9–10.3)
CO2: 27 mmol/L (ref 22–32)
CREATININE: 1.2 mg/dL (ref 0.61–1.24)
Chloride: 105 mmol/L (ref 101–111)
GFR calc non Af Amer: >60 mL/min (ref >60)
Glucose, Bld: 111 mg/dL — ABNORMAL HIGH (ref 65–99)
Potassium: 3.5 mmol/L (ref 3.5–5.1)
SODIUM: 140 mmol/L (ref 135–145)

## 2016-11-09 LAB — BRAIN NATRIURETIC PEPTIDE: B Natriuretic Peptide: 151 pg/mL — ABNORMAL HIGH (ref 0.0–100.0)

## 2016-11-09 MED ORDER — LORAZEPAM 2 MG/ML IJ SOLN
0.5000 mg | Freq: Once | INTRAMUSCULAR | Status: AC
  Administered 2016-11-09: 0.5 mg via INTRAVENOUS
  Filled 2016-11-09: qty 1

## 2016-11-09 MED ORDER — ASPIRIN 81 MG PO CHEW
324.0000 mg | CHEWABLE_TABLET | Freq: Once | ORAL | Status: AC
  Administered 2016-11-09: 324 mg via ORAL
  Filled 2016-11-09: qty 4

## 2016-11-09 NOTE — ED Provider Notes (Signed)
[facility] Emergency Department Provider Note   ____________________________________________   First MD Initiated Contact with Patient 11/09/16 0117     (approximate)  I have reviewed the triage vital signs and the nursing notes.   HISTORY  Chief Complaint Chest Pain    HPI Carlos Brewer. is a 31 y.o. male who presents to the ED from home with a chief complaint of chest pain.patient has a recent admission for acute infective myocarditis, acute systolic heart failure, cocaine abuse with EF 15%. Reports central chest tightness while resting approximately 2 hours prior to arrival. Symptoms were not associated with diaphoresis, shortness of breath, nauseating, palpitations or dizziness. States symptoms are very different from his presenting symptoms at last hospitalization. In total, patient took 4 nitroglycerin tablets without effect. Denies recent fever, chills, abdominal pain, diarrhea. Denies recent travel or trauma. Upon multiple queries, patient reluctantly admits to cocaine use yesterday.   Past Medical History:  Diagnosis Date  . Acute infective myocarditis 10/2016  . CHF (congestive heart failure) ([place])   . Chronic CHF (congestive heart failure) ([place])   . Community acquired pneumonia    a. Bilat - dx 10/12/2016.  . Morbid obesity ([place])   . Palpitations   . Tobacco abuse     Patient Active Problem List   Diagnosis Date Noted  . Nonischemic cardiomyopathy ([place]) 11/07/2016  . Chronic systolic heart failure ([place]) 11/05/2016  . Anxiety 11/05/2016  . Substance use disorder 11/05/2016  . Acute systolic heart failure ([place])   . Pneumonia 10/24/2016    Past Surgical History:  Procedure Laterality Date  . CORONARY ANGIOPLASTY    . RIGHT/LEFT HEART CATH AND CORONARY ANGIOGRAPHY N/A 10/27/2016   Procedure: RIGHT/LEFT HEART CATH AND CORONARY ANGIOGRAPHY;  Surgeon: Wellington Hampshire, MD;  Location: [place] [facility];  Service: Cardiovascular;   Laterality: N/A;  . WISDOM TOOTH EXTRACTION  2008    Prior to Admission medications   Medication Sig Start Date End Date Taking? Authorizing Provider  acetaminophen (TYLENOL) 500 MG tablet Take 2 tablets (1,000 mg total) by mouth every 6 (six) hours as needed. Patient taking differently: Take 1,000 mg by mouth every 6 (six) hours as needed for mild pain or fever.  10/12/16  Yes Betancourt, Aura Fey, NP  aspirin EC 81 MG EC tablet Take 1 tablet (81 mg total) by mouth daily. 10/30/16  Yes Demetrios Loll, MD  atorvastatin (LIPITOR) 40 MG tablet Take 1 tablet (40 mg total) by mouth daily at 6 PM. 10/29/16  Yes Demetrios Loll, MD  carvedilol (COREG) 3.125 MG tablet Take 1 tablet (3.125 mg total) by mouth 2 (two) times daily with a meal. 10/29/16  Yes Demetrios Loll, MD  fluticasone [facility]) 50 MCG/ACT nasal spray Place 1 spray into both nostrils 2 (two) times daily. 10/12/16  Yes Betancourt, Aura Fey, NP  loratadine (CLARITIN) 10 MG tablet Take 1 tablet (10 mg total) by mouth daily. 10/12/16  Yes Betancourt, Aura Fey, NP  mupirocin ointment (BACTROBAN) 2 % Apply 1 application topically 2 (two) times daily. 10/20/16  Yes Frederich Cha, MD  nitroGLYCERIN (NITROSTAT) 0.4 MG SL tablet Place 1 tablet (0.4 mg total) under the tongue every 5 (five) minutes as needed for chest pain. 10/29/16  Yes Demetrios Loll, MD  sacubitril-valsartan (ENTRESTO) 24-26 MG Take 1 tablet by mouth 2 (two) times daily. 10/29/16  Yes Demetrios Loll, MD  sertraline (ZOLOFT) 25 MG tablet Take 1 tablet (25 mg total) by mouth daily. 10/30/16  Yes  Demetrios Loll, MD  spironolactone (ALDACTONE) 25 MG tablet Take 25 mg by mouth daily.   Yes [provider]  torsemide (DEMADEX) 20 MG tablet Take 2 tablets (40 mg total) by mouth 2 (two) times daily. 11/07/16  Yes Minna Merritts, MD  traZODone (DESYREL) 50 MG tablet Take 1 tablet (50 mg total) by mouth at bedtime. 10/29/16  Yes Demetrios Loll, MD    Allergies Patient has no known allergies.  Family History  Problem  Relation Age of Onset  . Healthy Mother   . Healthy Father     Social History Social History  Substance Use Topics  . Smoking status: Former Smoker    Packs/day: 0.25    Types: Cigarettes    Quit date: 10/23/2016  . Smokeless tobacco: Never Used  . Alcohol use No     Comment: none    Review of Systems  Constitutional: No fever/chills. Eyes: No visual changes. ENT: No sore throat. Cardiovascular: Positive for chest pain. Respiratory: Denies shortness of breath. Gastrointestinal: No abdominal pain.  No nausea, no vomiting.  No diarrhea.  No constipation. Genitourinary: Negative for dysuria. Musculoskeletal: Negative for back pain. Skin: Negative for rash. Neurological: Negative for headaches, focal weakness or numbness.   ____________________________________________   PHYSICAL EXAM:  VITAL SIGNS: ED Triage Vitals  Enc Vitals Group     BP 11/09/16 0029 115/62     Pulse Rate 11/09/16 0029 75     Resp 11/09/16 0029 16     Temp --      Temp src --      SpO2 11/09/16 0029 100 %     Weight 11/09/16 0030 280 lb (127 kg)     Height 11/09/16 0030 6' (1.829 m)     Head Circumference --      Peak Flow --      Pain Score 11/09/16 0029 3     Pain Loc --      Pain Edu? --      Excl. in [place]? --     Constitutional: Alert and oriented. Well appearing and in no acute distress. Eyes: Conjunctivae are normal. PERRL. EOMI. Head: Atraumatic. Nose: No congestion/rhinnorhea. Mouth/Throat: Mucous membranes are moist.  Oropharynx non-erythematous. Neck: No stridor.   Cardiovascular: Normal rate, regular rhythm. Grossly normal heart sounds.  Good peripheral circulation. Respiratory: Normal respiratory effort.  No retractions. Lungs CTAB. Gastrointestinal: Soft and nontender. No distention. No abdominal bruits. No CVA tenderness. Musculoskeletal: No lower extremity tenderness nor edema.  No joint effusions. Neurologic:  Normal speech and language. No gross focal neurologic deficits  are appreciated. No gait instability. Skin:  Skin is warm, dry and intact. No rash noted. Psychiatric: Mood and affect are normal. Speech and behavior are normal.  ____________________________________________   LABS (all labs ordered are listed, but only abnormal results are displayed)  Labs Reviewed  BASIC METABOLIC PANEL - Abnormal; Notable for the following:       Result Value   Glucose, Bld 111 (*)    BUN 21 (*)    All other components within normal limits  CBC - Abnormal; Notable for the following:    RBC 4.16 (*)    HCT 36.7 (*)    All other components within normal limits  TROPONIN I - Abnormal; Notable for the following:    Troponin I 0.04 (*)    All other components within normal limits  URINE DRUG SCREEN, QUALITATIVE ([facility] ONLY) - Abnormal; Notable for the following:    Cocaine Metabolite,Ur  [place] POSITIVE (*)    All other components within normal limits  BRAIN NATRIURETIC PEPTIDE - Abnormal; Notable for the following:    B Natriuretic Peptide 151.0 (*)    All other components within normal limits  TROPONIN I - Abnormal; Notable for the following:    Troponin I 0.03 (*)    All other components within normal limits  TROPONIN I - Abnormal; Notable for the following:    Troponin I 0.03 (*)    All other components within normal limits   ____________________________________________  EKG  ED ECG REPORT I, SUNG,JADE J, the attending physician, personally viewed and interpreted this ECG.   Date: 11/09/2016  EKG Time: 0026  Rate: 78  Rhythm: normal EKG, normal sinus rhythm  Axis: normal  Intervals:none  ST&T Change: Nonspecific  ____________________________________________  RADIOLOGY  Dg Chest 2 View  Result Date: 11/09/2016 CLINICAL DATA:  Central chest pain. EXAM: CHEST  2 VIEW COMPARISON:  Radiograph 10/27/2016, additional priors FINDINGS: Decreasing cardiomegaly from prior exam. Mediastinal contours are unchanged. The lungs are clear. Pulmonary vasculature is  normal. No consolidation, pleural effusion, or pneumothorax. No acute osseous abnormalities are seen. IMPRESSION: Mild cardiomegaly, improved from prior.  No acute pulmonary process. Electronically Signed   By: Jeb Levering M.D.   On: 11/09/2016 01:12    ____________________________________________   PROCEDURES  Procedure(s) performed: None  Procedures  Critical Care performed: No  ____________________________________________   INITIAL IMPRESSION / ASSESSMENT AND PLAN / ED COURSE  Pertinent labs & imaging results that were available during my care of the patient were reviewed by me and considered in my medical decision making (see chart for details).  31 year old male presenting with chest pain with recent hospitalization for myocarditis, heart failure with EF 15%, in the setting of cocaine abuse.he is currently chest pain-free and resting in no acute distress.BMP, chest x-ray, troponin improved from prior. Will repeat timed troponin. Will administer aspirin, low-dose Ativan for cocaine-induced chest pain.  ----------------------------------------- 2:13 AM on 11/09/2016 -----------------------------------------   OBSERVATION CARE: This patient is being placed under observation care for the following reasons: Chest pain with repeat testing to rule out ischemia    Clinical Course as of Nov 09 520  Sun Nov 09, 2016  1610 No recurrence of CP. Strongly encouraged patient to discontinue cocaine use. Strict return precautions given. Patient verbalizes understanding and agrees with plan of care.  [JS]    Clinical Course User Index [JS] Paulette Blanch, MD    ----------------------------------------- 4:13 AM on 11/09/2016 -----------------------------------------   END OF OBSERVATION STATUS: After an appropriate period of observation, this patient is being discharged due to the following reason(s):  Improved troponin, bnp from recent admission. No CP. Cocaine use.    ____________________________________________   FINAL CLINICAL IMPRESSION(S) / ED DIAGNOSES  Final diagnoses:  Chest pain, unspecified type  Cocaine abuse      NEW MEDICATIONS STARTED DURING THIS VISIT:  New Prescriptions   No medications on file     Note:  This document was prepared using Dragon voice recognition software and may include unintentional dictation errors.    Paulette Blanch, MD 11/09/16 [phone]

## 2016-11-09 NOTE — ED Notes (Signed)
ED Provider at bedside. 

## 2016-11-09 NOTE — ED Triage Notes (Signed)
Pt states central chest pain that began approx one hour pta. Pt states he took 4ntg at home with improvement in pain. Pt with recent cardiac cath last week. Pt states he has an ejection frac of 15%. Pt appears in no acute distress.

## 2016-11-09 NOTE — ED Notes (Signed)
Verbal report given to Selinda Eon, [place]

## 2016-11-09 NOTE — Discharge Instructions (Signed)
1. Avoid cocaine use as this is making your heart function worse. 2. Return to the ER for worsening symptoms, persistent vomiting, difficulty breathing or other concerns.

## 2016-11-10 ENCOUNTER — Telehealth: Payer: Self-pay | Admitting: Cardiovascular Disease

## 2016-11-10 NOTE — Telephone Encounter (Signed)
Looks like pt's ED visit was 2/2 cocaine use.  Would recommend he f/u as scheduled.

## 2016-11-10 NOTE — Telephone Encounter (Signed)
Pt was seen in ED on 11/09/16 for CP, but patient was just in office on 11/07/16  Please advise if patient needs appointment

## 2016-11-11 ENCOUNTER — Telehealth: Payer: Self-pay | Admitting: Cardiovascular Disease

## 2016-11-11 NOTE — Telephone Encounter (Signed)
Received records request from [place], forwarded to [facility] for processing.

## 2016-11-11 NOTE — Telephone Encounter (Signed)
Faxed new roi to [employer].

## 2016-11-11 NOTE — Telephone Encounter (Signed)
Patient came to the office to sign roi for [employer] form.  Please call patient to discuss needs for an additional payment or if this is included in prior forms and fee .

## 2016-11-12 NOTE — Telephone Encounter (Signed)
Patient returning call.

## 2016-11-12 NOTE — Telephone Encounter (Signed)
Spoke with patient and let him know that he would need to check with his PCP for referral for possible sleep study. He verbalized understanding and stated he would check with the [facility] for that. He also wanted to know if his paperwork has been processed for short term disability. Advised him that they do require a fee to process any forms and that they are sent out and then back to the physician. He verbalized understanding and states that he will come by to take care of that so we can get those forms processed.

## 2016-11-13 ENCOUNTER — Ambulatory Visit: Payer: Commercial Managed Care - PPO | Admitting: Physician Assistant

## 2016-11-13 NOTE — Telephone Encounter (Signed)
Patient came to office  With $25 money order .  Faxed to [employer] . Mailed interoffice mail.  Patient needs asap .

## 2016-11-14 NOTE — Telephone Encounter (Signed)
Received paper work from [employer] in [place]

## 2016-11-14 NOTE — Telephone Encounter (Signed)
[employer] folder placed in Dr. Simon Rhein for review and signature.

## 2016-11-18 ENCOUNTER — Telehealth: Payer: Self-pay | Admitting: Cardiovascular Disease

## 2016-11-18 NOTE — Telephone Encounter (Signed)
Spoke with patient and he states that he went to see his PCP over at the [facility] and that they just called due to his low blood pressure. He denied any symptoms with that low pressure but states that his PCP physician wanted [place] to be aware. Advised him to start monitoring his blood pressures at home and keep a log of those readings so we can see how his blood pressure trends. He did want to know if we had received his paperwork that needs to be completed. Let him know that we do have it and it is pending physician review. He verbalized understanding of our conversation, agreement with plan, and had no further questions at this time.

## 2016-11-18 NOTE — Telephone Encounter (Signed)
Dr Tamala Julian from [place] (pcp)  Calling stating pt had low BP today States it was about 78/60 They are worried that was a low reading on patient Wanted to know if we had an advise for patient Pt is switching over to [facility] heart care but would like to know until then if we can help patient.

## 2016-11-18 NOTE — Telephone Encounter (Signed)
pcp Dr Tamala Julian d

## 2016-11-21 NOTE — Telephone Encounter (Signed)
[employer] packet placed in folder for Dr. Rockey Situ to review and sign.

## 2016-11-26 ENCOUNTER — Telehealth: Payer: Self-pay | Admitting: Cardiovascular Disease

## 2016-11-26 NOTE — Telephone Encounter (Signed)
Sent completed forms to [employer] forms

## 2016-11-26 NOTE — Telephone Encounter (Signed)
[employer] forms completed, signed, and placed in outgoing mail.

## 2016-11-27 ENCOUNTER — Encounter: Payer: Commercial Managed Care - PPO | Attending: Cardiovascular Disease

## 2016-11-27 DIAGNOSIS — Z79899 Other long term (current) drug therapy: Secondary | ICD-10-CM | POA: Insufficient documentation

## 2016-11-27 DIAGNOSIS — I5022 Chronic systolic (congestive) heart failure: Secondary | ICD-10-CM | POA: Insufficient documentation

## 2016-11-27 DIAGNOSIS — I214 Non-ST elevation (NSTEMI) myocardial infarction: Secondary | ICD-10-CM | POA: Insufficient documentation

## 2016-11-27 DIAGNOSIS — F1990 Other psychoactive substance use, unspecified, uncomplicated: Secondary | ICD-10-CM | POA: Insufficient documentation

## 2016-11-27 DIAGNOSIS — Z87891 Personal history of nicotine dependence: Secondary | ICD-10-CM | POA: Insufficient documentation

## 2016-11-27 DIAGNOSIS — Z7982 Long term (current) use of aspirin: Secondary | ICD-10-CM | POA: Insufficient documentation

## 2016-12-05 ENCOUNTER — Other Ambulatory Visit: Payer: Self-pay

## 2016-12-05 MED ORDER — SACUBITRIL-VALSARTAN 24-26 MG PO TABS: 1.0000 | ORAL_TABLET | Freq: Two times a day (BID) | ORAL | 3 refills | Status: DC

## 2016-12-05 NOTE — Telephone Encounter (Signed)
Please advise if ok to refill Entresto.

## 2016-12-08 ENCOUNTER — Telehealth: Payer: Self-pay | Admitting: Family

## 2016-12-08 ENCOUNTER — Ambulatory Visit: Payer: Commercial Managed Care - PPO | Admitting: Family

## 2016-12-08 NOTE — Telephone Encounter (Signed)
Patient did not show for his [facility] appointment on 12/08/16. Will attempt to reschedule.

## 2016-12-11 ENCOUNTER — Encounter: Payer: Commercial Managed Care - PPO | Admitting: *Deleted

## 2016-12-11 ENCOUNTER — Encounter: Payer: Self-pay | Admitting: *Deleted

## 2016-12-11 VITALS — Ht 72.0 in | Wt 282.3 lb

## 2016-12-11 DIAGNOSIS — Z79899 Other long term (current) drug therapy: Secondary | ICD-10-CM | POA: Diagnosis not present

## 2016-12-11 DIAGNOSIS — I5022 Chronic systolic (congestive) heart failure: Secondary | ICD-10-CM | POA: Diagnosis present

## 2016-12-11 DIAGNOSIS — I214 Non-ST elevation (NSTEMI) myocardial infarction: Secondary | ICD-10-CM | POA: Diagnosis not present

## 2016-12-11 DIAGNOSIS — Z7982 Long term (current) use of aspirin: Secondary | ICD-10-CM | POA: Diagnosis not present

## 2016-12-11 DIAGNOSIS — F1990 Other psychoactive substance use, unspecified, uncomplicated: Secondary | ICD-10-CM | POA: Diagnosis not present

## 2016-12-11 DIAGNOSIS — Z87891 Personal history of nicotine dependence: Secondary | ICD-10-CM | POA: Diagnosis not present

## 2016-12-11 NOTE — Progress Notes (Signed)
Cardiac Individual Treatment Plan  Patient Details  Name: Carlos Brewer. MRN: [id number] Date of Birth: [birth year] Referring Provider:     Cardiac Rehab from 12/11/2016 in [facility] Cardiac and Pulmonary Rehab  Referring Provider  Gollan      Initial Encounter Date:    Cardiac Rehab from 12/11/2016 in [facility] Cardiac and Pulmonary Rehab  Date  12/11/16  Referring Provider  Rockey Situ      Visit Diagnosis: NSTEMI (non-ST elevated myocardial infarction) ([place])  Heart failure, chronic systolic ([place])  Patient's Home Medications on Admission:  Current Outpatient Prescriptions:  .  acetaminophen (TYLENOL) 500 MG tablet, Take 2 tablets (1,000 mg total) by mouth every 6 (six) hours as needed. (Patient taking differently: Take 1,000 mg by mouth every 6 (six) hours as needed for mild pain or fever. ), Disp: 30 tablet, Rfl: 0 .  aspirin EC 81 MG EC tablet, Take 1 tablet (81 mg total) by mouth daily., Disp: 30 tablet, Rfl: 2 .  atorvastatin (LIPITOR) 40 MG tablet, Take 1 tablet (40 mg total) by mouth daily at 6 PM., Disp: 30 tablet, Rfl: 2 .  carvedilol (COREG) 3.125 MG tablet, Take 1 tablet (3.125 mg total) by mouth 2 (two) times daily with a meal., Disp: 60 tablet, Rfl: 2 .  fluticasone (FLONASE) 50 MCG/ACT nasal spray, Place 1 spray into both nostrils 2 (two) times daily., Disp: 16 g, Rfl: 0 .  loratadine (CLARITIN) 10 MG tablet, Take 1 tablet (10 mg total) by mouth daily., Disp: 30 tablet, Rfl: 1 .  nitroGLYCERIN (NITROSTAT) 0.4 MG SL tablet, Place 1 tablet (0.4 mg total) under the tongue every 5 (five) minutes as needed for chest pain., Disp: 30 tablet, Rfl: 2 .  sacubitril-valsartan (ENTRESTO) 24-26 MG, Take 1 tablet by mouth 2 (two) times daily., Disp: 60 tablet, Rfl: 3 .  sertraline (ZOLOFT) 100 MG tablet, TK 1 T PO QD, Disp: , Rfl: 2 .  spironolactone (ALDACTONE) 25 MG tablet, Take 25 mg by mouth daily., Disp: , Rfl:  .  torsemide (DEMADEX) 20 MG tablet, Take 2 tablets (40 mg total) by mouth 2  (two) times daily., Disp: 120 tablet, Rfl: 7 .  traZODone (DESYREL) 50 MG tablet, Take 1 tablet (50 mg total) by mouth at bedtime., Disp: 30 tablet, Rfl: 0 .  mupirocin ointment (BACTROBAN) 2 %, Apply 1 application topically 2 (two) times daily. (Patient not taking: Reported on 12/11/2016), Disp: 22 g, Rfl: 0 .  sertraline (ZOLOFT) 25 MG tablet, Take 1 tablet (25 mg total) by mouth daily. (Patient not taking: Reported on 12/11/2016), Disp: 30 tablet, Rfl: 0  Past Medical History: Past Medical History:  Diagnosis Date  . Acute infective myocarditis 10/2016  . CHF (congestive heart failure) ([employer])   . Chronic CHF (congestive heart failure) ([place])   . Community acquired pneumonia    a. Bilat - dx 10/12/2016.  . Morbid obesity ([place])   . Palpitations   . Tobacco abuse     Tobacco Use: History  Smoking Status  . Former Smoker  . Packs/day: 0.25  . Types: Cigarettes  . Quit date: 10/23/2016  Smokeless Tobacco  . Never Used    Labs: Recent Review Flowsheet Data    Labs for ITP Cardiac and Pulmonary Rehab Latest Ref Rng & Units 10/26/2016   Cholestrol 0 - 200 mg/dL 213(H)   LDLCALC 0 - 99 mg/dL 130(H)   HDL >40 mg/dL 62   Trlycerides <150 mg/dL 106       Exercise  Target Goals: Date: 12/11/16  Exercise Program Goal: Individual exercise prescription set with THRR, safety & activity barriers. Participant demonstrates ability to understand and report RPE using BORG scale, to self-measure pulse accurately, and to acknowledge the importance of the exercise prescription.  Exercise Prescription Goal: Starting with aerobic activity 30 plus minutes a day, 3 days per week for initial exercise prescription. Provide home exercise prescription and guidelines that participant acknowledges understanding prior to discharge.  Activity Barriers & Risk Stratification:     Activity Barriers & Cardiac Risk Stratification - 12/11/16 1424      Activity Barriers & Cardiac Risk Stratification   Activity  Barriers Chest Pain/Angina  He does not like to take his Nitro because he does not like the side effects. He does take his Aspirin and says that tends to help if he were to have pain.    Cardiac Risk Stratification High      6 Minute Walk:     6 Minute Walk    Row Name 12/11/16 1437         6 Minute Walk   Phase Initial     Distance 1548 feet     Walk Time 6 minutes     # of Rest Breaks 0     MPH 2.93     METS 5.1     RPE 11     VO2 Peak 17.85     Symptoms No     Resting HR 80 bpm     Resting BP 110/76     Resting Oxygen Saturation  98 %     Exercise Oxygen Saturation  during 6 min walk 98 %     Max Ex. HR 115 bpm     Max Ex. BP 154/92     2 Minute Post BP 116/74        Oxygen Initial Assessment:   Oxygen Re-Evaluation:   Oxygen Discharge (Final Oxygen Re-Evaluation):   Initial Exercise Prescription:     Initial Exercise Prescription - 12/11/16 1400      Date of Initial Exercise RX and Referring Provider   Date 12/11/16   Referring Provider Gollan     Treadmill   MPH 3   Grade 4   Minutes 15   METs 4.95     Recumbant Bike   Level 8   RPM 60   Watts 120   Minutes 15   METs 5     Elliptical   Level 1   Speed 3   Minutes 15   METs 5     REL-XR   Level 10   Speed 50   Minutes 15   METs 5     Prescription Details   Frequency (times per week) 3   Duration Progress to 45 minutes of aerobic exercise without signs/symptoms of physical distress     Intensity   THRR 40-80% of Max Heartrate 123-166   Ratings of Perceived Exertion 11-15   Perceived Dyspnea 0-4     Resistance Training   Training Prescription Yes   Weight 5 lb.   Reps 10-15      Perform Capillary Blood Glucose checks as needed.  Exercise Prescription Changes:     Exercise Prescription Changes    Row Name 12/11/16 1400             Response to Exercise   Blood Pressure (Admit) 110/76       Blood Pressure (Exercise) 154/94       Blood Pressure (  Exit) 116/74        Heart Rate (Admit) 80 bpm       Heart Rate (Exercise) 115 bpm       Heart Rate (Exit) 77 bpm       Oxygen Saturation (Admit) 98 %       Oxygen Saturation (Exercise) 98 %       Rating of Perceived Exertion (Exercise) 11          Exercise Comments:   Exercise Goals and Review:     Exercise Goals    Row Name 12/11/16 1439             Exercise Goals   Increase Physical Activity Yes       Intervention Provide advice, education, support and counseling about physical activity/exercise needs.;Develop an individualized exercise prescription for aerobic and resistive training based on initial evaluation findings, risk stratification, comorbidities and participant's personal goals.       Expected Outcomes Achievement of increased cardiorespiratory fitness and enhanced flexibility, muscular endurance and strength shown through measurements of functional capacity and personal statement of participant.       Increase Strength and Stamina Yes       Intervention Provide advice, education, support and counseling about physical activity/exercise needs.;Develop an individualized exercise prescription for aerobic and resistive training based on initial evaluation findings, risk stratification, comorbidities and participant's personal goals.       Expected Outcomes Achievement of increased cardiorespiratory fitness and enhanced flexibility, muscular endurance and strength shown through measurements of functional capacity and personal statement of participant.       Able to understand and use rate of perceived exertion (RPE) scale Yes       Intervention Provide education and explanation on how to use RPE scale       Expected Outcomes Short Term: Able to use RPE daily in rehab to express subjective intensity level;Long Term:  Able to use RPE to guide intensity level when exercising independently       Able to understand and use Dyspnea scale Yes       Intervention Provide education and explanation on  how to use Dyspnea scale       Expected Outcomes Short Term: Able to use Dyspnea scale daily in rehab to express subjective sense of shortness of breath during exertion;Long Term: Able to use Dyspnea scale to guide intensity level when exercising independently       Knowledge and understanding of Target Heart Rate Range (THRR) Yes       Intervention Provide education and explanation of THRR including how the numbers were predicted and where they are located for reference       Expected Outcomes Short Term: Able to state/look up THRR;Long Term: Able to use THRR to govern intensity when exercising independently;Short Term: Able to use daily as guideline for intensity in rehab       Able to check pulse independently Yes       Intervention Provide education and demonstration on how to check pulse in carotid and radial arteries.;Review the importance of being able to check your own pulse for safety during independent exercise       Expected Outcomes Short Term: Able to explain why pulse checking is important during independent exercise;Long Term: Able to check pulse independently and accurately       Understanding of Exercise Prescription Yes       Intervention Provide education, explanation, and written materials on patient's individual exercise prescription  Expected Outcomes Short Term: Able to explain program exercise prescription;Long Term: Able to explain home exercise prescription to exercise independently          Exercise Goals Re-Evaluation :   Discharge Exercise Prescription (Final Exercise Prescription Changes):     Exercise Prescription Changes - 12/11/16 1400      Response to Exercise   Blood Pressure (Admit) 110/76   Blood Pressure (Exercise) 154/94   Blood Pressure (Exit) 116/74   Heart Rate (Admit) 80 bpm   Heart Rate (Exercise) 115 bpm   Heart Rate (Exit) 77 bpm   Oxygen Saturation (Admit) 98 %   Oxygen Saturation (Exercise) 98 %   Rating of Perceived Exertion  (Exercise) 11      Nutrition:  Target Goals: Understanding of nutrition guidelines, daily intake of sodium 1500mg , cholesterol 200mg , calories 30% from fat and 7% or less from saturated fats, daily to have 5 or more servings of fruits and vegetables.  Biometrics:     Pre Biometrics - 12/11/16 1436      Pre Biometrics   Height 6' (1.829 m)   Weight 282 lb 4.8 oz (128.1 kg)   Waist Circumference 47.5 inches   Hip Circumference 50 inches   Waist to Hip Ratio 0.95 %   BMI (Calculated) 38.28   Single Leg Stand 30 seconds       Nutrition Therapy Plan and Nutrition Goals:   Nutrition Discharge: Rate Your Plate Scores:     Nutrition Assessments - 12/11/16 1408      MEDFICTS Scores   Pre Score 42      Nutrition Goals Re-Evaluation:   Nutrition Goals Discharge (Final Nutrition Goals Re-Evaluation):   Psychosocial: Target Goals: Acknowledge presence or absence of significant depression and/or stress, maximize coping skills, provide positive support system. Participant is able to verbalize types and ability to use techniques and skills needed for reducing stress and depression.   Initial Review & Psychosocial Screening:     Initial Psych Review & Screening - 12/11/16 1414      Initial Review   Current issues with Current Depression;History of Depression;Current Anxiety/Panic;Current Psychotropic Meds;Current Sleep Concerns;Current Stress Concerns   Source of Stress Concerns Poor Coping Skills;Financial;Chronic Illness;Unable to participate in former interests or hobbies   Comments Aydian has a history of insomnia dating back to his days in the [employer]. Even with sleep medicine, he still usually sleeps only about 3 hours a night. For his depression, he was put on Zoloft and it was originally helping, but now he is starting to feel more down lately. He wants to see if this program helps, but is stressed about other issues in his life that he did not want to talk about at this  time.      Family Dynamics   Good Support System? Yes  Girlfriend and Mom      Screening Interventions   Interventions Yes;Encouraged to exercise;Program counselor consult   Expected Outcomes Short Term goal: Utilizing psychosocial counselor, staff and physician to assist with identification of specific Stressors or current issues interfering with healing process. Setting desired goal for each stressor or current issue identified.;Long Term Goal: Stressors or current issues are controlled or eliminated.;Short Term goal: Identification and review with participant of any Quality of Life or Depression concerns found by scoring the questionnaire.;Long Term goal: The participant improves quality of Life and PHQ9 Scores as seen by post scores and/or verbalization of changes      Quality of Life Scores:  Quality of Life - 12/11/16 1420      Quality of Life Scores   Health/Function Pre 21 %   Socioeconomic Pre 19.71 %   Psych/Spiritual Pre 20.29 %   Family Pre 20.2 %   GLOBAL Pre 20.47 %      PHQ-9: Recent Review Flowsheet Data    Depression screen [facility] 2/9 12/11/2016 11/05/2016   Decreased Interest 2 0   Down, Depressed, Hopeless 3 0   PHQ - 2 Score 5 0   Altered sleeping 3 -   Tired, decreased energy 2 -   Change in appetite 0 -   Feeling bad or failure about yourself  3 -   Trouble concentrating 3 -   Moving slowly or fidgety/restless 0 -   Suicidal thoughts 1 -   PHQ-9 Score 17 -   Difficult doing work/chores Extremely dIfficult  -     Interpretation of Total Score  Total Score Depression Severity:  1-4 = Minimal depression, 5-9 = Mild depression, 10-14 = Moderate depression, 15-19 = Moderately severe depression, 20-27 = Severe depression   Psychosocial Evaluation and Intervention:   Psychosocial Re-Evaluation:   Psychosocial Discharge (Final Psychosocial Re-Evaluation):   Vocational Rehabilitation: Provide vocational rehab assistance to qualifying  candidates.   Vocational Rehab Evaluation & Intervention:     Vocational Rehab - 12/11/16 1421      Initial Vocational Rehab Evaluation & Intervention   Assessment shows need for Vocational Rehabilitation No      Education: Education Goals: Education classes will be provided on a variety of topics geared toward better understanding of heart health and risk factor modification. Participant will state understanding/return demonstration of topics presented as noted by education test scores.  Learning Barriers/Preferences:     Learning Barriers/Preferences - 12/11/16 1420      Learning Barriers/Preferences   Learning Barriers None   Learning Preferences None      Education Topics: General Nutrition Guidelines/Fats and Fiber: -Group instruction provided by verbal, written material, models and posters to present the general guidelines for heart healthy nutrition. Gives an explanation and review of dietary fats and fiber.   Controlling Sodium/Reading Food Labels: -Group verbal and written material supporting the discussion of sodium use in heart healthy nutrition. Review and explanation with models, verbal and written materials for utilization of the food label.   Exercise Physiology & Risk Factors: - Group verbal and written instruction with models to review the exercise physiology of the cardiovascular system and associated critical values. Details cardiovascular disease risk factors and the goals associated with each risk factor.   Aerobic Exercise & Resistance Training: - Gives group verbal and written discussion on the health impact of inactivity. On the components of aerobic and resistive training programs and the benefits of this training and how to safely progress through these programs.   Flexibility, Balance, General Exercise Guidelines: - Provides group verbal and written instruction on the benefits of flexibility and balance training programs. Provides general  exercise guidelines with specific guidelines to those with heart or lung disease. Demonstration and skill practice provided.   Stress Management: - Provides group verbal and written instruction about the health risks of elevated stress, cause of high stress, and healthy ways to reduce stress.   Depression: - Provides group verbal and written instruction on the correlation between heart/lung disease and depressed mood, treatment options, and the stigmas associated with seeking treatment.   Anatomy & Physiology of the Heart: - Group verbal and written instruction and models provide  basic cardiac anatomy and physiology, with the coronary electrical and arterial systems. Review of: AMI, Angina, Valve disease, Heart Failure, Cardiac Arrhythmia, Pacemakers, and the ICD.   Cardiac Procedures: - Group verbal and written instruction to review commonly prescribed medications for heart disease. Reviews the medication, class of the drug, and side effects. Includes the steps to properly store meds and maintain the prescription regimen. (beta blockers and nitrates)   Cardiac Medications I: - Group verbal and written instruction to review commonly prescribed medications for heart disease. Reviews the medication, class of the drug, and side effects. Includes the steps to properly store meds and maintain the prescription regimen.   Cardiac Medications II: -Group verbal and written instruction to review commonly prescribed medications for heart disease. Reviews the medication, class of the drug, and side effects. (all other drug classes)    Go Sex-Intimacy & Heart Disease, Get SMART - Goal Setting: - Group verbal and written instruction through game format to discuss heart disease and the return to sexual intimacy. Provides group verbal and written material to discuss and apply goal setting through the application of the S.M.A.R.T. Method.   Other Matters of the Heart: - Provides group verbal, written  materials and models to describe Heart Failure, Angina, Valve Disease, Peripheral Artery Disease, and Diabetes in the realm of heart disease. Includes description of the disease process and treatment options available to the cardiac patient.   Exercise & Equipment Safety: - Individual verbal instruction and demonstration of equipment use and safety with use of the equipment.   Cardiac Rehab from 12/11/2016 in [facility] Cardiac and Pulmonary Rehab  Date  12/11/16  Educator  KS  Instruction Review Code  1- Verbalizes Understanding      Infection Prevention: - Provides verbal and written material to individual with discussion of infection control including proper hand washing and proper equipment cleaning during exercise session.   Cardiac Rehab from 12/11/2016 in [facility] Cardiac and Pulmonary Rehab  Date  12/11/16  Educator  KS  Instruction Review Code  1- Verbalizes Understanding      Falls Prevention: - Provides verbal and written material to individual with discussion of falls prevention and safety.   Cardiac Rehab from 12/11/2016 in [facility] Cardiac and Pulmonary Rehab  Date  12/11/16  Educator  KS  Instruction Review Code  1- Verbalizes Understanding      Diabetes: - Individual verbal and written instruction to review signs/symptoms of diabetes, desired ranges of glucose level fasting, after meals and with exercise. Acknowledge that pre and post exercise glucose checks will be done for 3 sessions at entry of program.   Other: -Provides group and verbal instruction on various topics (see comments)    Knowledge Questionnaire Score:     Knowledge Questionnaire Score - 12/11/16 1420      Knowledge Questionnaire Score   Pre Score 22/28  Correct answers reviewed with Kacey      Core Components/Risk Factors/Patient Goals at Admission:     Personal Goals and Risk Factors at Admission - 12/11/16 1353      Core Components/Risk Factors/Patient Goals on Admission    Weight  Management Yes;Weight Loss;Obesity   Intervention Weight Management: Develop a combined nutrition and exercise program designed to reach desired caloric intake, while maintaining appropriate intake of nutrient and fiber, sodium and fats, and appropriate energy expenditure required for the weight goal.;Weight Management: Provide education and appropriate resources to help participant work on and attain dietary goals.;Weight Management/Obesity: Establish reasonable short term and long term  weight goals.;Obesity: Provide education and appropriate resources to help participant work on and attain dietary goals.   Admit Weight 282 lb 4.8 oz (128.1 kg)   Goal Weight: Short Term 278 lb (126.1 kg)   Goal Weight: Long Term 240 lb (108.9 kg)   Expected Outcomes Short Term: Continue to assess and modify interventions until short term weight is achieved;Long Term: Adherence to nutrition and physical activity/exercise program aimed toward attainment of established weight goal;Weight Loss: Understanding of general recommendations for a balanced deficit meal plan, which promotes 1-2 lb weight loss per week and includes a negative energy balance of [phone] kcal/d;Understanding recommendations for meals to include 15-35% energy as protein, 25-35% energy from fat, 35-60% energy from carbohydrates, less than 200mg  of dietary cholesterol, 20-35 gm of total fiber daily;Understanding of distribution of calorie intake throughout the day with the consumption of 4-5 meals/snacks   Heart Failure Yes   Intervention Provide a combined exercise and nutrition program that is supplemented with education, support and counseling about heart failure. Directed toward relieving symptoms such as shortness of breath, decreased exercise tolerance, and extremity edema.   Expected Outcomes Improve functional capacity of life;Short term: Attendance in program 2-3 days a week with increased exercise capacity. Reported lower sodium intake. Reported  increased fruit and vegetable intake. Reports medication compliance.;Short term: Daily weights obtained and reported for increase. Utilizing diuretic protocols set by physician.;Long term: Adoption of self-care skills and reduction of barriers for early signs and symptoms recognition and intervention leading to self-care maintenance.   Stress Yes  He was surprised by his new heart event. He also stated having issues with poor coping skills, and financial issues.    Intervention Offer individual and/or small group education and counseling on adjustment to heart disease, stress management and health-related lifestyle change. Teach and support self-help strategies.;Refer participants experiencing significant psychosocial distress to appropriate mental health specialists for further evaluation and treatment. When possible, include family members and significant others in education/counseling sessions.   Expected Outcomes Short Term: Participant demonstrates changes in health-related behavior, relaxation and other stress management skills, ability to obtain effective social support, and compliance with psychotropic medications if prescribed.;Long Term: Emotional wellbeing is indicated by absence of clinically significant psychosocial distress or social isolation.      Core Components/Risk Factors/Patient Goals Review:    Core Components/Risk Factors/Patient Goals at Discharge (Final Review):    ITP Comments:     ITP Comments    Row Name 12/11/16 1347           ITP Comments Med Review Completed. Initial ITP created. Diagnosis can be found in Office visit 11/07/16          Comments: Initial ITP

## 2016-12-11 NOTE — Progress Notes (Signed)
Daily Session Note  Patient Details  Name: Carlos Brewer. MRN: [id number] Date of Birth: 1985-03-25 Referring Provider:     Cardiac Rehab from 12/11/2016 in [facility]  Referring Provider  Gollan      Encounter Date: 12/11/2016  Check In:     Session Check In - 12/11/16 1346      Check-In   Location [facility]   Staff Present Renita Papa, RN Geralyn Corwin, RN Vickki Hearing, BA, ACSM CEP, Exercise Physiologist   Supervising physician immediately available to respond to emergencies See telemetry face sheet for immediately available ER MD   Medication changes reported     No   Fall or balance concerns reported    No   Warm-up and Cool-down Performed on first and last piece of equipment   Resistance Training Performed Yes   VAD Patient? No     Pain Assessment   Currently in Pain? No/denies           Exercise Prescription Changes - 12/11/16 1400      Response to Exercise   Blood Pressure (Admit) 110/76   Blood Pressure (Exercise) 154/94   Blood Pressure (Exit) 116/74   Heart Rate (Admit) 80 bpm   Heart Rate (Exercise) 115 bpm   Heart Rate (Exit) 77 bpm   Oxygen Saturation (Admit) 98 %   Oxygen Saturation (Exercise) 98 %   Rating of Perceived Exertion (Exercise) 11      History  Smoking Status  . Former Smoker  . Packs/day: 0.25  . Types: Cigarettes  . Quit date: 10/23/2016  Smokeless Tobacco  . Never Used    Goals Met:  Proper associated with RPD/PD & O2 Sat Exercise tolerated well Personal goals reviewed No report of cardiac concerns or symptoms Strength training completed today  Goals Unmet:  Not Applicable  Comments: Med Review Completed   Dr. Emily Filbert is Medical Director for [facility].

## 2016-12-11 NOTE — Patient Instructions (Signed)
Patient Instructions  Patient Details  Name: Carlos Brewer. MRN: [id number] Date of Birth: 1985/03/20 Referring Provider:  Minna Merritts, MD  Below are the personal goals you chose as well as exercise and nutrition goals. Our goal is to help you keep on track towards obtaining and maintaining your goals. We will be discussing your progress on these goals with you throughout the program.  Initial Exercise Prescription:     Initial Exercise Prescription - 12/11/16 1400      Date of Initial Exercise RX and Referring Provider   Date 12/11/16   Referring Provider Gollan     Treadmill   MPH 3   Grade 4   Minutes 15   METs 4.95     Recumbant Bike   Level 8   RPM 60   Watts 120   Minutes 15   METs 5     Elliptical   Level 1   Speed 3   Minutes 15   METs 5     REL-XR   Level 10   Speed 50   Minutes 15   METs 5     Prescription Details   Frequency (times per week) 3   Duration Progress to 45 minutes of aerobic exercise without signs/symptoms of physical distress     Intensity   THRR 40-80% of Max Heartrate 123-166   Ratings of Perceived Exertion 11-15   Perceived Dyspnea 0-4     Resistance Training   Training Prescription Yes   Weight 5 lb.   Reps 10-15      Exercise Goals: Frequency: Be able to perform aerobic exercise three times per week working toward 3-5 days per week.  Intensity: Work with a perceived exertion of 11 (fairly light) - 15 (hard) as tolerated. Follow your new exercise prescription and watch for changes in prescription as you progress with the program. Changes will be reviewed with you when they are made.  Duration: You should be able to do 30 minutes of continuous aerobic exercise in addition to a 5 minute warm-up and a 5 minute cool-down routine.  Nutrition Goals: Your personal nutrition goals will be established when you do your nutrition analysis with the dietician.  The following are nutrition guidelines to  follow: Cholesterol < 200mg /day Sodium < 1500mg /day Fiber: Men under 50 yrs - 38 grams per day  Personal Goals:     Personal Goals and Risk Factors at Admission - 12/11/16 1353      Core Components/Risk Factors/Patient Goals on Admission    Weight Management Yes;Weight Loss;Obesity   Intervention Weight Management: Develop a combined nutrition and exercise program designed to reach desired caloric intake, while maintaining appropriate intake of nutrient and fiber, sodium and fats, and appropriate energy expenditure required for the weight goal.;Weight Management: Provide education and appropriate resources to help participant work on and attain dietary goals.;Weight Management/Obesity: Establish reasonable short term and long term weight goals.;Obesity: Provide education and appropriate resources to help participant work on and attain dietary goals.   Admit Weight 282 lb 4.8 oz (128.1 kg)   Goal Weight: Short Term 278 lb (126.1 kg)   Goal Weight: Long Term 240 lb (108.9 kg)   Expected Outcomes Short Term: Continue to assess and modify interventions until short term weight is achieved;Long Term: Adherence to nutrition and physical activity/exercise program aimed toward attainment of established weight goal;Weight Loss: Understanding of general recommendations for a balanced deficit meal plan, which promotes 1-2 lb weight loss per week and includes  a negative energy balance of [id number] kcal/d;Understanding recommendations for meals to include 15-35% energy as protein, 25-35% energy from fat, 35-60% energy from carbohydrates, less than 200mg  of dietary cholesterol, 20-35 gm of total fiber daily;Understanding of distribution of calorie intake throughout the day with the consumption of 4-5 meals/snacks   Heart Failure Yes   Intervention Provide a combined exercise and nutrition program that is supplemented with education, support and counseling about heart failure. Directed toward relieving symptoms  such as shortness of breath, decreased exercise tolerance, and extremity edema.   Expected Outcomes Improve functional capacity of life;Short term: Attendance in program 2-3 days a week with increased exercise capacity. Reported lower sodium intake. Reported increased fruit and vegetable intake. Reports medication compliance.;Short term: Daily weights obtained and reported for increase. Utilizing diuretic protocols set by physician.;Long term: Adoption of self-care skills and reduction of barriers for early signs and symptoms recognition and intervention leading to self-care maintenance.   Stress Yes  He was surprised by his new heart event. He also stated having issues with poor coping skills, and financial issues.    Intervention Offer individual and/or small group education and counseling on adjustment to heart disease, stress management and health-related lifestyle change. Teach and support self-help strategies.;Refer participants experiencing significant psychosocial distress to appropriate mental health specialists for further evaluation and treatment. When possible, include family members and significant others in education/counseling sessions.   Expected Outcomes Short Term: Participant demonstrates changes in health-related behavior, relaxation and other stress management skills, ability to obtain effective social support, and compliance with psychotropic medications if prescribed.;Long Term: Emotional wellbeing is indicated by absence of clinically significant psychosocial distress or social isolation.      Tobacco Use Initial Evaluation: History  Smoking Status  . Former Smoker  . Packs/day: 0.25  . Types: Cigarettes  . Quit date: 10/23/2016  Smokeless Tobacco  . Never Used    Exercise Goals and Review:     Exercise Goals    Row Name 12/11/16 1439             Exercise Goals   Increase Physical Activity Yes       Intervention Provide advice, education, support and counseling  about physical activity/exercise needs.;Develop an individualized exercise prescription for aerobic and resistive training based on initial evaluation findings, risk stratification, comorbidities and participant's personal goals.       Expected Outcomes Achievement of increased cardiorespiratory fitness and enhanced flexibility, muscular endurance and strength shown through measurements of functional capacity and personal statement of participant.       Increase Strength and Stamina Yes       Intervention Provide advice, education, support and counseling about physical activity/exercise needs.;Develop an individualized exercise prescription for aerobic and resistive training based on initial evaluation findings, risk stratification, comorbidities and participant's personal goals.       Expected Outcomes Achievement of increased cardiorespiratory fitness and enhanced flexibility, muscular endurance and strength shown through measurements of functional capacity and personal statement of participant.       Able to understand and use rate of perceived exertion (RPE) scale Yes       Intervention Provide education and explanation on how to use RPE scale       Expected Outcomes Short Term: Able to use RPE daily in rehab to express subjective intensity level;Long Term:  Able to use RPE to guide intensity level when exercising independently       Able to understand and use Dyspnea scale Yes  Intervention Provide education and explanation on how to use Dyspnea scale       Expected Outcomes Short Term: Able to use Dyspnea scale daily in rehab to express subjective sense of shortness of breath during exertion;Long Term: Able to use Dyspnea scale to guide intensity level when exercising independently       Knowledge and understanding of Target Heart Rate Range (THRR) Yes       Intervention Provide education and explanation of THRR including how the numbers were predicted and where they are located for reference        Expected Outcomes Short Term: Able to state/look up THRR;Long Term: Able to use THRR to govern intensity when exercising independently;Short Term: Able to use daily as guideline for intensity in rehab       Able to check pulse independently Yes       Intervention Provide education and demonstration on how to check pulse in carotid and radial arteries.;Review the importance of being able to check your own pulse for safety during independent exercise       Expected Outcomes Short Term: Able to explain why pulse checking is important during independent exercise;Long Term: Able to check pulse independently and accurately       Understanding of Exercise Prescription Yes       Intervention Provide education, explanation, and written materials on patient's individual exercise prescription       Expected Outcomes Short Term: Able to explain program exercise prescription;Long Term: Able to explain home exercise prescription to exercise independently          Copy of goals given to participant.

## 2016-12-15 ENCOUNTER — Encounter: Payer: Commercial Managed Care - PPO | Attending: Cardiovascular Disease | Admitting: *Deleted

## 2016-12-15 DIAGNOSIS — Z7982 Long term (current) use of aspirin: Secondary | ICD-10-CM | POA: Insufficient documentation

## 2016-12-15 DIAGNOSIS — Z79899 Other long term (current) drug therapy: Secondary | ICD-10-CM | POA: Diagnosis not present

## 2016-12-15 DIAGNOSIS — I5022 Chronic systolic (congestive) heart failure: Secondary | ICD-10-CM | POA: Insufficient documentation

## 2016-12-15 DIAGNOSIS — Z87891 Personal history of nicotine dependence: Secondary | ICD-10-CM | POA: Diagnosis not present

## 2016-12-15 DIAGNOSIS — I214 Non-ST elevation (NSTEMI) myocardial infarction: Secondary | ICD-10-CM | POA: Diagnosis not present

## 2016-12-15 DIAGNOSIS — F1990 Other psychoactive substance use, unspecified, uncomplicated: Secondary | ICD-10-CM | POA: Diagnosis not present

## 2016-12-15 NOTE — Progress Notes (Signed)
Daily Session Note  Patient Details  Name: Carlos Brewer. MRN: [id number] Date of Birth: 1985-12-28 Referring Provider:     Cardiac Rehab from 12/11/2016 in [facility]  Referring Provider  Gollan      Encounter Date: 12/15/2016  Check In:     Session Check In - 12/15/16 0753      Check-In   Location [facility]   Staff Present Alberteen Sam, MA, ACSM RCEP, Exercise Physiologist;Meredith Sherryll Burger, RN Moises Blood, BS, ACSM CEP, Exercise Physiologist   Supervising physician immediately available to respond to emergencies See telemetry face sheet for immediately available ER MD   Medication changes reported     No   Fall or balance concerns reported    No   Warm-up and Cool-down Performed on first and last piece of equipment   Resistance Training Performed Yes   VAD Patient? No     Pain Assessment   Currently in Pain? No/denies   Multiple Pain Sites No         History  Smoking Status  . Former Smoker  . Packs/day: 0.25  . Types: Cigarettes  . Quit date: 10/23/2016  Smokeless Tobacco  . Never Used    Goals Met:  Exercise tolerated well Personal goals reviewed No report of cardiac concerns or symptoms Strength training completed today  Goals Unmet:  Not Applicable  Comments: First full day of exercise!  Patient was oriented to gym and equipment including functions, settings, policies, and procedures.  Patient's individual exercise prescription and treatment plan were reviewed.  All starting workloads were established based on the results of the 6 minute walk test done at initial orientation visit.  The plan for exercise progression was also introduced and progression will be customized based on patient's performance and goals.    Dr. Emily Filbert is Medical Director for [facility].

## 2016-12-19 ENCOUNTER — Telehealth: Payer: Self-pay | Admitting: *Deleted

## 2016-12-19 NOTE — Telephone Encounter (Signed)
Called Carlos Brewer to see why he has missed class. He reported feeling fine, he had forgotten about class. We talked about his medications and importance of taking his fluid pill. Encouragement given to weigh self everyday and to monitor fluid intake and diet. [name] said he should be there Monday.

## 2016-12-24 ENCOUNTER — Encounter: Payer: Self-pay | Admitting: *Deleted

## 2016-12-24 NOTE — Progress Notes (Signed)
Cardiac Individual Treatment Plan  Patient Details  Name: Carlos Brewer. MRN: [id number] Date of Birth: September 17, 1985 Referring Provider:     Cardiac Rehab from 12/11/2016 in [facility] Cardiac and Pulmonary Rehab  Referring Provider  Gollan      Initial Encounter Date:    Cardiac Rehab from 12/11/2016 in [facility] Cardiac and Pulmonary Rehab  Date  12/11/16  Referring Provider  Rockey Situ      Visit Diagnosis: No diagnosis found.  Patient's Home Medications on Admission:  Current Outpatient Prescriptions:  .  acetaminophen (TYLENOL) 500 MG tablet, Take 2 tablets (1,000 mg total) by mouth every 6 (six) hours as needed. (Patient taking differently: Take 1,000 mg by mouth every 6 (six) hours as needed for mild pain or fever. ), Disp: 30 tablet, Rfl: 0 .  aspirin EC 81 MG EC tablet, Take 1 tablet (81 mg total) by mouth daily., Disp: 30 tablet, Rfl: 2 .  atorvastatin (LIPITOR) 40 MG tablet, Take 1 tablet (40 mg total) by mouth daily at 6 PM., Disp: 30 tablet, Rfl: 2 .  carvedilol (COREG) 3.125 MG tablet, Take 1 tablet (3.125 mg total) by mouth 2 (two) times daily with a meal., Disp: 60 tablet, Rfl: 2 .  fluticasone (FLONASE) 50 MCG/ACT nasal spray, Place 1 spray into both nostrils 2 (two) times daily., Disp: 16 g, Rfl: 0 .  loratadine (CLARITIN) 10 MG tablet, Take 1 tablet (10 mg total) by mouth daily., Disp: 30 tablet, Rfl: 1 .  mupirocin ointment (BACTROBAN) 2 %, Apply 1 application topically 2 (two) times daily. (Patient not taking: Reported on 12/11/2016), Disp: 22 g, Rfl: 0 .  nitroGLYCERIN (NITROSTAT) 0.4 MG SL tablet, Place 1 tablet (0.4 mg total) under the tongue every 5 (five) minutes as needed for chest pain., Disp: 30 tablet, Rfl: 2 .  sacubitril-valsartan (ENTRESTO) 24-26 MG, Take 1 tablet by mouth 2 (two) times daily., Disp: 60 tablet, Rfl: 3 .  sertraline (ZOLOFT) 100 MG tablet, TK 1 T PO QD, Disp: , Rfl: 2 .  sertraline (ZOLOFT) 25 MG tablet, Take 1 tablet (25 mg total) by mouth daily.  (Patient not taking: Reported on 12/11/2016), Disp: 30 tablet, Rfl: 0 .  spironolactone (ALDACTONE) 25 MG tablet, Take 25 mg by mouth daily., Disp: , Rfl:  .  torsemide (DEMADEX) 20 MG tablet, Take 2 tablets (40 mg total) by mouth 2 (two) times daily., Disp: 120 tablet, Rfl: 7 .  traZODone (DESYREL) 50 MG tablet, Take 1 tablet (50 mg total) by mouth at bedtime., Disp: 30 tablet, Rfl: 0  Past Medical History: Past Medical History:  Diagnosis Date  . Acute infective myocarditis 10/2016  . CHF (congestive heart failure) ([place])   . Chronic CHF (congestive heart failure) ([place])   . Community acquired pneumonia    a. Bilat - dx 10/12/2016.  . Morbid obesity ([place])   . Palpitations   . Tobacco abuse     Tobacco Use: History  Smoking Status  . Former Smoker  . Packs/day: 0.25  . Types: Cigarettes  . Quit date: 10/23/2016  Smokeless Tobacco  . Never Used    Labs: Recent Review Flowsheet Data    Labs for ITP Cardiac and Pulmonary Rehab Latest Ref Rng & Units 10/26/2016   Cholestrol 0 - 200 mg/dL 213(H)   LDLCALC 0 - 99 mg/dL 130(H)   HDL >40 mg/dL 62   Trlycerides <150 mg/dL 106       Exercise Target Goals:    Exercise Program Goal: Individual  exercise prescription set with THRR, safety & activity barriers. Participant demonstrates ability to understand and report RPE using BORG scale, to self-measure pulse accurately, and to acknowledge the importance of the exercise prescription.  Exercise Prescription Goal: Starting with aerobic activity 30 plus minutes a day, 3 days per week for initial exercise prescription. Provide home exercise prescription and guidelines that participant acknowledges understanding prior to discharge.  Activity Barriers & Risk Stratification:     Activity Barriers & Cardiac Risk Stratification - 12/11/16 1424      Activity Barriers & Cardiac Risk Stratification   Activity Barriers Chest Pain/Angina  He does not like to take his Nitro because he does not  like the side effects. He does take his Aspirin and says that tends to help if he were to have pain.    Cardiac Risk Stratification High      6 Minute Walk:     6 Minute Walk    Row Name 12/11/16 1437         6 Minute Walk   Phase Initial     Distance 1548 feet     Walk Time 6 minutes     # of Rest Breaks 0     MPH 2.93     METS 5.1     RPE 11     VO2 Peak 17.85     Symptoms No     Resting HR 80 bpm     Resting BP 110/76     Resting Oxygen Saturation  98 %     Exercise Oxygen Saturation  during 6 min walk 98 %     Max Ex. HR 115 bpm     Max Ex. BP 154/92     2 Minute Post BP 116/74        Oxygen Initial Assessment:   Oxygen Re-Evaluation:   Oxygen Discharge (Final Oxygen Re-Evaluation):   Initial Exercise Prescription:     Initial Exercise Prescription - 12/11/16 1400      Date of Initial Exercise RX and Referring Provider   Date 12/11/16   Referring Provider Gollan     Treadmill   MPH 3   Grade 4   Minutes 15   METs 4.95     Recumbant Bike   Level 8   RPM 60   Watts 120   Minutes 15   METs 5     Elliptical   Level 1   Speed 3   Minutes 15   METs 5     REL-XR   Level 10   Speed 50   Minutes 15   METs 5     Prescription Details   Frequency (times per week) 3   Duration Progress to 45 minutes of aerobic exercise without signs/symptoms of physical distress     Intensity   THRR 40-80% of Max Heartrate 123-166   Ratings of Perceived Exertion 11-15   Perceived Dyspnea 0-4     Resistance Training   Training Prescription Yes   Weight 5 lb.   Reps 10-15      Perform Capillary Blood Glucose checks as needed.  Exercise Prescription Changes:     Exercise Prescription Changes    Row Name 12/11/16 1400 12/18/16 1300           Response to Exercise   Blood Pressure (Admit) 110/76 126/78      Blood Pressure (Exercise) 154/94 154/80      Blood Pressure (Exit) 116/74 114/60      Heart  Rate (Admit) 80 bpm 83 bpm      Heart Rate  (Exercise) 115 bpm 131 bpm      Heart Rate (Exit) 77 bpm 98 bpm      Oxygen Saturation (Admit) 98 %  -      Oxygen Saturation (Exercise) 98 %  -      Rating of Perceived Exertion (Exercise) 11 12      Symptoms  - none      Comments  - first full day of exercise      Duration  - Progress to 45 minutes of aerobic exercise without signs/symptoms of physical distress      Intensity  - THRR unchanged        Progression   Progression  - Continue to progress workloads to maintain intensity without signs/symptoms of physical distress.      Average METs  - 4.5        Resistance Training   Training Prescription  - Yes      Weight  - 5 lb.      Reps  - 10-15        Interval Training   Interval Training  - No        Treadmill   MPH  - 3      Grade  - 4      Minutes  - 15      METs  - 4.95        REL-XR   Level  - 8      Minutes  - 15      METs  - 4.1         Exercise Comments:     Exercise Comments    Row Name 12/15/16 0755           Exercise Comments First full day of exercise!  Patient was oriented to gym and equipment including functions, settings, policies, and procedures.  Patient's individual exercise prescription and treatment plan were reviewed.  All starting workloads were established based on the results of the 6 minute walk test done at initial orientation visit.  The plan for exercise progression was also introduced and progression will be customized based on patient's performance and goals.          Exercise Goals and Review:     Exercise Goals    Row Name 12/11/16 1439             Exercise Goals   Increase Physical Activity Yes       Intervention Provide advice, education, support and counseling about physical activity/exercise needs.;Develop an individualized exercise prescription for aerobic and resistive training based on initial evaluation findings, risk stratification, comorbidities and participant's personal goals.       Expected Outcomes  Achievement of increased cardiorespiratory fitness and enhanced flexibility, muscular endurance and strength shown through measurements of functional capacity and personal statement of participant.       Increase Strength and Stamina Yes       Intervention Provide advice, education, support and counseling about physical activity/exercise needs.;Develop an individualized exercise prescription for aerobic and resistive training based on initial evaluation findings, risk stratification, comorbidities and participant's personal goals.       Expected Outcomes Achievement of increased cardiorespiratory fitness and enhanced flexibility, muscular endurance and strength shown through measurements of functional capacity and personal statement of participant.       Able to understand and use rate of perceived exertion (RPE) scale Yes  Intervention Provide education and explanation on how to use RPE scale       Expected Outcomes Short Term: Able to use RPE daily in rehab to express subjective intensity level;Long Term:  Able to use RPE to guide intensity level when exercising independently       Able to understand and use Dyspnea scale Yes       Intervention Provide education and explanation on how to use Dyspnea scale       Expected Outcomes Short Term: Able to use Dyspnea scale daily in rehab to express subjective sense of shortness of breath during exertion;Long Term: Able to use Dyspnea scale to guide intensity level when exercising independently       Knowledge and understanding of Target Heart Rate Range (THRR) Yes       Intervention Provide education and explanation of THRR including how the numbers were predicted and where they are located for reference       Expected Outcomes Short Term: Able to state/look up THRR;Long Term: Able to use THRR to govern intensity when exercising independently;Short Term: Able to use daily as guideline for intensity in rehab       Able to check pulse independently Yes        Intervention Provide education and demonstration on how to check pulse in carotid and radial arteries.;Review the importance of being able to check your own pulse for safety during independent exercise       Expected Outcomes Short Term: Able to explain why pulse checking is important during independent exercise;Long Term: Able to check pulse independently and accurately       Understanding of Exercise Prescription Yes       Intervention Provide education, explanation, and written materials on patient's individual exercise prescription       Expected Outcomes Short Term: Able to explain program exercise prescription;Long Term: Able to explain home exercise prescription to exercise independently          Exercise Goals Re-Evaluation :     Exercise Goals Re-Evaluation    Row Name 12/15/16 0754 12/18/16 1342           Exercise Goal Re-Evaluation   Exercise Goals Review Understanding of Exercise Prescription;Knowledge and understanding of Target Heart Rate Range (THRR);Able to understand and use rate of perceived exertion (RPE) scale Increase Physical Activity;Increase Strength and Stamina      Comments Reviewed RPE scale, THR and program prescription with pt today.  Pt voiced understanding and was given a copy of goals to take home.  Riyan has completed his first full day of exercise.  We will continue to monitor his progression throughout the program.      Expected Outcomes Short: Use RPE daily to regulate intensity.  Long: Follow program prescription in THR. Short: Attend classes regularly.  Long: Continue to follow program prescription.          Discharge Exercise Prescription (Final Exercise Prescription Changes):     Exercise Prescription Changes - 12/18/16 1300      Response to Exercise   Blood Pressure (Admit) 126/78   Blood Pressure (Exercise) 154/80   Blood Pressure (Exit) 114/60   Heart Rate (Admit) 83 bpm   Heart Rate (Exercise) 131 bpm   Heart Rate (Exit) 98 bpm    Rating of Perceived Exertion (Exercise) 12   Symptoms none   Comments first full day of exercise   Duration Progress to 45 minutes of aerobic exercise without signs/symptoms of physical distress   Intensity  THRR unchanged     Progression   Progression Continue to progress workloads to maintain intensity without signs/symptoms of physical distress.   Average METs 4.5     Resistance Training   Training Prescription Yes   Weight 5 lb.   Reps 10-15     Interval Training   Interval Training No     Treadmill   MPH 3   Grade 4   Minutes 15   METs 4.95     REL-XR   Level 8   Minutes 15   METs 4.1      Nutrition:  Target Goals: Understanding of nutrition guidelines, daily intake of sodium <1562m, cholesterol <2080m calories 30% from fat and 7% or less from saturated fats, daily to have 5 or more servings of fruits and vegetables.  Biometrics:     Pre Biometrics - 12/11/16 1436      Pre Biometrics   Height 6' (1.829 m)   Weight 282 lb 4.8 oz (128.1 kg)   Waist Circumference 47.5 inches   Hip Circumference 50 inches   Waist to Hip Ratio 0.95 %   BMI (Calculated) 38.28   Single Leg Stand 30 seconds       Nutrition Therapy Plan and Nutrition Goals:   Nutrition Discharge: Rate Your Plate Scores:     Nutrition Assessments - 12/11/16 1408      MEDFICTS Scores   Pre Score 42      Nutrition Goals Re-Evaluation:   Nutrition Goals Discharge (Final Nutrition Goals Re-Evaluation):   Psychosocial: Target Goals: Acknowledge presence or absence of significant depression and/or stress, maximize coping skills, provide positive support system. Participant is able to verbalize types and ability to use techniques and skills needed for reducing stress and depression.   Initial Review & Psychosocial Screening:     Initial Psych Review & Screening - 12/11/16 1414      Initial Review   Current issues with Current Depression;History of Depression;Current  Anxiety/Panic;Current Psychotropic Meds;Current Sleep Concerns;Current Stress Concerns   Source of Stress Concerns Poor Coping Skills;Financial;Chronic Illness;Unable to participate in former interests or hobbies   Comments LaShaun[place]as a history of insomnia dating back to his days in the [employer] with sleep medicine, he still usually sleeps only about 3 hours a night. For his depression, he was put on Zoloft and it was originally helping, but now he is starting to feel more down lately. He wants to see if this program helps, but is stressed about other issues in his life that he did not want to talk about at this time.      Family Dynamics   Good Support System? Yes  Girlfriend and Mom      Screening Interventions   Interventions Yes;Encouraged to exercise;Program counselor consult   Expected Outcomes Short Term goal: Utilizing psychosocial counselor, staff and physician to assist with identification of specific Stressors or current issues interfering with healing process. Setting desired goal for each stressor or current issue identified.;Long Term Goal: Stressors or current issues are controlled or eliminated.;Short Term goal: Identification and review with participant of any Quality of Life or Depression concerns found by scoring the questionnaire.;Long Term goal: The participant improves quality of Life and PHQ9 Scores as seen by post scores and/or verbalization of changes      Quality of Life Scores:      Quality of Life - 12/11/16 1420      Quality of Life Scores   Health/Function Pre 21 %   Socioeconomic  Pre 19.71 %   Psych/Spiritual Pre 20.29 %   Family Pre 20.2 %   GLOBAL Pre 20.47 %      PHQ-9: Recent Review Flowsheet Data    Depression screen [facility] 2/9 12/11/2016 11/05/2016   Decreased Interest 2 0   Down, Depressed, Hopeless 3 0   PHQ - 2 Score 5 0   Altered sleeping 3 -   Tired, decreased energy 2 -   Change in appetite 0 -   Feeling bad or failure about yourself  3 -    Trouble concentrating 3 -   Moving slowly or fidgety/restless 0 -   Suicidal thoughts 1 -   PHQ-9 Score 17 -   Difficult doing work/chores Extremely dIfficult  -     Interpretation of Total Score  Total Score Depression Severity:  1-4 = Minimal depression, 5-9 = Mild depression, 10-14 = Moderate depression, 15-19 = Moderately severe depression, 20-27 = Severe depression   Psychosocial Evaluation and Intervention:     Psychosocial Evaluation - 12/15/16 0947      Psychosocial Evaluation & Interventions   Interventions Encouraged to exercise with the program and follow exercise prescription;Relaxation education;Stress management education   Comments Counselor met with Mr. Seppala Ma Rings) today for initial psychosocial evaluation.  He is a 31 year old who is experiencing low-ejection fraction and some symptoms of Angina.  He has a limited support system with a mother and significant other he lives with.  He is an [place] who has experienced PTSD symptoms since then; with symptoms of problems sleeping; feeling bad about himself; and depressive symptoms with his PHQ-9 being a "17" indicating moderately severe depression. He is on medications for mood and for sleep reporting the Zoloft has been increased and is being helpful; but the sleep meds are "hit or miss" with an average of (3) hours per night of sleep.  He admits to multiple stressors currently with finances and concerns about losing his job if he doesn't return soon.  He has goals to increase his confidence in his ability to work out regularly and decrease his anxiety about his health issues and ability to work out.  Counselor encouraged Hasnain to consider a group for PTSD through the [place] to develop skills to cope better.  Counselor will be following with Ma Rings throughout the course of this program.    Expected Outcomes Atanacio will benefit from consistent exercise to achieve his stated goals.  The educational and psychoeducational  components will be helpful in understanding and learning to cope better with his health and mental health condition.  [facility] groups for PTSD and consider attending.  Counselor will follow.   Continue Psychosocial Services  Follow up required by counselor      Psychosocial Re-Evaluation:   Psychosocial Discharge (Final Psychosocial Re-Evaluation):   Vocational Rehabilitation: Provide vocational rehab assistance to qualifying candidates.   Vocational Rehab Evaluation & Intervention:     Vocational Rehab - 12/11/16 1421      Initial Vocational Rehab Evaluation & Intervention   Assessment shows need for Vocational Rehabilitation No      Education: Education Goals: Education classes will be provided on a variety of topics geared toward better understanding of heart health and risk factor modification. Participant will state understanding/return demonstration of topics presented as noted by education test scores.  Learning Barriers/Preferences:     Learning Barriers/Preferences - 12/11/16 1420      Learning Barriers/Preferences   Learning Barriers None  Learning Preferences None      Education Topics: General Nutrition Guidelines/Fats and Fiber: -Group instruction provided by verbal, written material, models and posters to present the general guidelines for heart healthy nutrition. Gives an explanation and review of dietary fats and fiber.   Controlling Sodium/Reading Food Labels: -Group verbal and written material supporting the discussion of sodium use in heart healthy nutrition. Review and explanation with models, verbal and written materials for utilization of the food label.   Exercise Physiology & Risk Factors: - Group verbal and written instruction with models to review the exercise physiology of the cardiovascular system and associated critical values. Details cardiovascular disease risk factors and the goals associated with  each risk factor.   Aerobic Exercise & Resistance Training: - Gives group verbal and written discussion on the health impact of inactivity. On the components of aerobic and resistive training programs and the benefits of this training and how to safely progress through these programs.   Flexibility, Balance, General Exercise Guidelines: - Provides group verbal and written instruction on the benefits of flexibility and balance training programs. Provides general exercise guidelines with specific guidelines to those with heart or lung disease. Demonstration and skill practice provided.   Stress Management: - Provides group verbal and written instruction about the health risks of elevated stress, cause of high stress, and healthy ways to reduce stress.   Depression: - Provides group verbal and written instruction on the correlation between heart/lung disease and depressed mood, treatment options, and the stigmas associated with seeking treatment.   Anatomy & Physiology of the Heart: - Group verbal and written instruction and models provide basic cardiac anatomy and physiology, with the coronary electrical and arterial systems. Review of: AMI, Angina, Valve disease, Heart Failure, Cardiac Arrhythmia, Pacemakers, and the ICD.   Cardiac Procedures: - Group verbal and written instruction to review commonly prescribed medications for heart disease. Reviews the medication, class of the drug, and side effects. Includes the steps to properly store meds and maintain the prescription regimen. (beta blockers and nitrates)   Cardiac Medications I: - Group verbal and written instruction to review commonly prescribed medications for heart disease. Reviews the medication, class of the drug, and side effects. Includes the steps to properly store meds and maintain the prescription regimen.   Cardiac Rehab from 12/15/2016 in [facility] Cardiac and Pulmonary Rehab  Date  12/15/16  Educator  [facility]  Instruction Review  Code  1- Verbalizes Understanding      Cardiac Medications II: -Group verbal and written instruction to review commonly prescribed medications for heart disease. Reviews the medication, class of the drug, and side effects. (all other drug classes)    Go Sex-Intimacy & Heart Disease, Get SMART - Goal Setting: - Group verbal and written instruction through game format to discuss heart disease and the return to sexual intimacy. Provides group verbal and written material to discuss and apply goal setting through the application of the S.M.A.R.T. Method.   Other Matters of the Heart: - Provides group verbal, written materials and models to describe Heart Failure, Angina, Valve Disease, Peripheral Artery Disease, and Diabetes in the realm of heart disease. Includes description of the disease process and treatment options available to the cardiac patient.   Exercise & Equipment Safety: - Individual verbal instruction and demonstration of equipment use and safety with use of the equipment.   Cardiac Rehab from 12/15/2016 in [facility] Cardiac and Pulmonary Rehab  Date  12/11/16  Educator  [place]  Instruction Review Code  1- Verbalizes Understanding      Infection Prevention: - Provides verbal and written material to individual with discussion of infection control including proper hand washing and proper equipment cleaning during exercise session.   Cardiac Rehab from 12/15/2016 in [facility] Cardiac and Pulmonary Rehab  Date  12/11/16  Educator  [place]  Instruction Review Code  1- Verbalizes Understanding      Falls Prevention: - Provides verbal and written material to individual with discussion of falls prevention and safety.   Cardiac Rehab from 12/15/2016 in [facility] Cardiac and Pulmonary Rehab  Date  12/11/16  Educator  [place]  Instruction Review Code  1- Verbalizes Understanding      Diabetes: - Individual verbal and written instruction to review signs/symptoms of diabetes, desired ranges of glucose level  fasting, after meals and with exercise. Acknowledge that pre and post exercise glucose checks will be done for 3 sessions at entry of program.   Other: -Provides group and verbal instruction on various topics (see comments)    Knowledge Questionnaire Score:     Knowledge Questionnaire Score - 12/11/16 1420      Knowledge Questionnaire Score   Pre Score 22/28  Correct answers reviewed with Jermaine      Core Components/Risk Factors/Patient Goals at Admission:     Personal Goals and Risk Factors at Admission - 12/11/16 1353      Core Components/Risk Factors/Patient Goals on Admission    Weight Management Yes;Weight Loss;Obesity   Intervention Weight Management: Develop a combined nutrition and exercise program designed to reach desired caloric intake, while maintaining appropriate intake of nutrient and fiber, sodium and fats, and appropriate energy expenditure required for the weight goal.;Weight Management: Provide education and appropriate resources to help participant work on and attain dietary goals.;Weight Management/Obesity: Establish reasonable short term and long term weight goals.;Obesity: Provide education and appropriate resources to help participant work on and attain dietary goals.   Admit Weight 282 lb 4.8 oz (128.1 kg)   Goal Weight: Short Term 278 lb (126.1 kg)   Goal Weight: Long Term 240 lb (108.9 kg)   Expected Outcomes Short Term: Continue to assess and modify interventions until short term weight is achieved;Long Term: Adherence to nutrition and physical activity/exercise program aimed toward attainment of established weight goal;Weight Loss: Understanding of general recommendations for a balanced deficit meal plan, which promotes 1-2 lb weight loss per week and includes a negative energy balance of [phone] kcal/d;Understanding recommendations for meals to include 15-35% energy as protein, 25-35% energy from fat, 35-60% energy from carbohydrates, less than 297m of  dietary cholesterol, 20-35 gm of total fiber daily;Understanding of distribution of calorie intake throughout the day with the consumption of 4-5 meals/snacks   Heart Failure Yes   Intervention Provide a combined exercise and nutrition program that is supplemented with education, support and counseling about heart failure. Directed toward relieving symptoms such as shortness of breath, decreased exercise tolerance, and extremity edema.   Expected Outcomes Improve functional capacity of life;Short term: Attendance in program 2-3 days a week with increased exercise capacity. Reported lower sodium intake. Reported increased fruit and vegetable intake. Reports medication compliance.;Short term: Daily weights obtained and reported for increase. Utilizing diuretic protocols set by physician.;Long term: Adoption of self-care skills and reduction of barriers for early signs and symptoms recognition and intervention leading to self-care maintenance.   Stress Yes  He was surprised by his new heart event. He also stated having issues with poor coping skills, and financial issues.  Intervention Offer individual and/or small group education and counseling on adjustment to heart disease, stress management and health-related lifestyle change. Teach and support self-help strategies.;Refer participants experiencing significant psychosocial distress to appropriate mental health specialists for further evaluation and treatment. When possible, include family members and significant others in education/counseling sessions.   Expected Outcomes Short Term: Participant demonstrates changes in health-related behavior, relaxation and other stress management skills, ability to obtain effective social support, and compliance with psychotropic medications if prescribed.;Long Term: Emotional wellbeing is indicated by absence of clinically significant psychosocial distress or social isolation.      Core Components/Risk  Factors/Patient Goals Review:    Core Components/Risk Factors/Patient Goals at Discharge (Final Review):    ITP Comments:     ITP Comments    Row Name 12/11/16 1347 12/24/16 0628         ITP Comments Med Review Completed. Initial ITP created. Diagnosis can be found in Office visit 11/07/16 30 day Review. Continue with ITP unless directed changes per Medical Director Review.          Comments:

## 2016-12-25 ENCOUNTER — Encounter: Payer: Self-pay | Admitting: *Deleted

## 2016-12-25 NOTE — Progress Notes (Signed)
Cardiac Individual Treatment Plan  Patient Details  Name: Carlos Brewer. MRN: [id number] Date of Birth: 12-04-1985 Referring Provider:     Cardiac Rehab from 12/11/2016 in [facility] Cardiac and Pulmonary Rehab  Referring Provider  Gollan      Initial Encounter Date:    Cardiac Rehab from 12/11/2016 in [facility] Cardiac and Pulmonary Rehab  Date  12/11/16  Referring Provider  Rockey Situ      Visit Diagnosis: NSTEMI (non-ST elevated myocardial infarction) ([place])  Heart failure, chronic systolic ([place])  Patient's Home Medications on Admission:  Current Outpatient Prescriptions:  .  acetaminophen (TYLENOL) 500 MG tablet, Take 2 tablets (1,000 mg total) by mouth every 6 (six) hours as needed. (Patient taking differently: Take 1,000 mg by mouth every 6 (six) hours as needed for mild pain or fever. ), Disp: 30 tablet, Rfl: 0 .  aspirin EC 81 MG EC tablet, Take 1 tablet (81 mg total) by mouth daily., Disp: 30 tablet, Rfl: 2 .  atorvastatin (LIPITOR) 40 MG tablet, Take 1 tablet (40 mg total) by mouth daily at 6 PM., Disp: 30 tablet, Rfl: 2 .  carvedilol (COREG) 3.125 MG tablet, Take 1 tablet (3.125 mg total) by mouth 2 (two) times daily with a meal., Disp: 60 tablet, Rfl: 2 .  fluticasone (FLONASE) 50 MCG/ACT nasal spray, Place 1 spray into both nostrils 2 (two) times daily., Disp: 16 g, Rfl: 0 .  loratadine (CLARITIN) 10 MG tablet, Take 1 tablet (10 mg total) by mouth daily., Disp: 30 tablet, Rfl: 1 .  mupirocin ointment (BACTROBAN) 2 %, Apply 1 application topically 2 (two) times daily. (Patient not taking: Reported on 12/11/2016), Disp: 22 g, Rfl: 0 .  nitroGLYCERIN (NITROSTAT) 0.4 MG SL tablet, Place 1 tablet (0.4 mg total) under the tongue every 5 (five) minutes as needed for chest pain., Disp: 30 tablet, Rfl: 2 .  sacubitril-valsartan (ENTRESTO) 24-26 MG, Take 1 tablet by mouth 2 (two) times daily., Disp: 60 tablet, Rfl: 3 .  sertraline (ZOLOFT) 100 MG tablet, TK 1 T PO QD, Disp: , Rfl: 2 .   sertraline (ZOLOFT) 25 MG tablet, Take 1 tablet (25 mg total) by mouth daily. (Patient not taking: Reported on 12/11/2016), Disp: 30 tablet, Rfl: 0 .  spironolactone (ALDACTONE) 25 MG tablet, Take 25 mg by mouth daily., Disp: , Rfl:  .  torsemide (DEMADEX) 20 MG tablet, Take 2 tablets (40 mg total) by mouth 2 (two) times daily., Disp: 120 tablet, Rfl: 7 .  traZODone (DESYREL) 50 MG tablet, Take 1 tablet (50 mg total) by mouth at bedtime., Disp: 30 tablet, Rfl: 0  Past Medical History: Past Medical History:  Diagnosis Date  . Acute infective myocarditis 10/2016  . CHF (congestive heart failure) ([place])   . Chronic CHF (congestive heart failure) ([place])   . Community acquired pneumonia    a. Bilat - dx 10/12/2016.  . Morbid obesity ([place])   . Palpitations   . Tobacco abuse     Tobacco Use: History  Smoking Status  . Former Smoker  . Packs/day: 0.25  . Types: Cigarettes  . Quit date: 10/23/2016  Smokeless Tobacco  . Never Used    Labs: Recent Review Flowsheet Data    Labs for ITP Cardiac and Pulmonary Rehab Latest Ref Rng & Units 10/26/2016   Cholestrol 0 - 200 mg/dL 213(H)   LDLCALC 0 - 99 mg/dL 130(H)   HDL >40 mg/dL 62   Trlycerides <150 mg/dL 106       Exercise  Target Goals:    Exercise Program Goal: Individual exercise prescription set with THRR, safety & activity barriers. Participant demonstrates ability to understand and report RPE using BORG scale, to self-measure pulse accurately, and to acknowledge the importance of the exercise prescription.  Exercise Prescription Goal: Starting with aerobic activity 30 plus minutes a day, 3 days per week for initial exercise prescription. Provide home exercise prescription and guidelines that participant acknowledges understanding prior to discharge.  Activity Barriers & Risk Stratification:     Activity Barriers & Cardiac Risk Stratification - 12/11/16 1424      Activity Barriers & Cardiac Risk Stratification   Activity  Barriers Chest Pain/Angina  He does not like to take his Nitro because he does not like the side effects. He does take his Aspirin and says that tends to help if he were to have pain.    Cardiac Risk Stratification High      6 Minute Walk:     6 Minute Walk    Row Name 12/11/16 1437         6 Minute Walk   Phase Initial     Distance 1548 feet     Walk Time 6 minutes     # of Rest Breaks 0     MPH 2.93     METS 5.1     RPE 11     VO2 Peak 17.85     Symptoms No     Resting HR 80 bpm     Resting BP 110/76     Resting Oxygen Saturation  98 %     Exercise Oxygen Saturation  during 6 min walk 98 %     Max Ex. HR 115 bpm     Max Ex. BP 154/92     2 Minute Post BP 116/74        Oxygen Initial Assessment:   Oxygen Re-Evaluation:   Oxygen Discharge (Final Oxygen Re-Evaluation):   Initial Exercise Prescription:     Initial Exercise Prescription - 12/11/16 1400      Date of Initial Exercise RX and Referring Provider   Date 12/11/16   Referring Provider Gollan     Treadmill   MPH 3   Grade 4   Minutes 15   METs 4.95     Recumbant Bike   Level 8   RPM 60   Watts 120   Minutes 15   METs 5     Elliptical   Level 1   Speed 3   Minutes 15   METs 5     REL-XR   Level 10   Speed 50   Minutes 15   METs 5     Prescription Details   Frequency (times per week) 3   Duration Progress to 45 minutes of aerobic exercise without signs/symptoms of physical distress     Intensity   THRR 40-80% of Max Heartrate 123-166   Ratings of Perceived Exertion 11-15   Perceived Dyspnea 0-4     Resistance Training   Training Prescription Yes   Weight 5 lb.   Reps 10-15      Perform Capillary Blood Glucose checks as needed.  Exercise Prescription Changes:     Exercise Prescription Changes    Row Name 12/11/16 1400 12/18/16 1300           Response to Exercise   Blood Pressure (Admit) 110/76 126/78      Blood Pressure (Exercise) 154/94 154/80      Blood  Pressure (Exit) 116/74 114/60      Heart Rate (Admit) 80 bpm 83 bpm      Heart Rate (Exercise) 115 bpm 131 bpm      Heart Rate (Exit) 77 bpm 98 bpm      Oxygen Saturation (Admit) 98 %  -      Oxygen Saturation (Exercise) 98 %  -      Rating of Perceived Exertion (Exercise) 11 12      Symptoms  - none      Comments  - first full day of exercise      Duration  - Progress to 45 minutes of aerobic exercise without signs/symptoms of physical distress      Intensity  - THRR unchanged        Progression   Progression  - Continue to progress workloads to maintain intensity without signs/symptoms of physical distress.      Average METs  - 4.5        Resistance Training   Training Prescription  - Yes      Weight  - 5 lb.      Reps  - 10-15        Interval Training   Interval Training  - No        Treadmill   MPH  - 3      Grade  - 4      Minutes  - 15      METs  - 4.95        REL-XR   Level  - 8      Minutes  - 15      METs  - 4.1         Exercise Comments:     Exercise Comments    Row Name 12/15/16 0755           Exercise Comments First full day of exercise!  Patient was oriented to gym and equipment including functions, settings, policies, and procedures.  Patient's individual exercise prescription and treatment plan were reviewed.  All starting workloads were established based on the results of the 6 minute walk test done at initial orientation visit.  The plan for exercise progression was also introduced and progression will be customized based on patient's performance and goals.          Exercise Goals and Review:     Exercise Goals    Row Name 12/11/16 1439             Exercise Goals   Increase Physical Activity Yes       Intervention Provide advice, education, support and counseling about physical activity/exercise needs.;Develop an individualized exercise prescription for aerobic and resistive training based on initial evaluation findings, risk  stratification, comorbidities and participant's personal goals.       Expected Outcomes Achievement of increased cardiorespiratory fitness and enhanced flexibility, muscular endurance and strength shown through measurements of functional capacity and personal statement of participant.       Increase Strength and Stamina Yes       Intervention Provide advice, education, support and counseling about physical activity/exercise needs.;Develop an individualized exercise prescription for aerobic and resistive training based on initial evaluation findings, risk stratification, comorbidities and participant's personal goals.       Expected Outcomes Achievement of increased cardiorespiratory fitness and enhanced flexibility, muscular endurance and strength shown through measurements of functional capacity and personal statement of participant.       Able to understand and use rate  of perceived exertion (RPE) scale Yes       Intervention Provide education and explanation on how to use RPE scale       Expected Outcomes Short Term: Able to use RPE daily in rehab to express subjective intensity level;Long Term:  Able to use RPE to guide intensity level when exercising independently       Able to understand and use Dyspnea scale Yes       Intervention Provide education and explanation on how to use Dyspnea scale       Expected Outcomes Short Term: Able to use Dyspnea scale daily in rehab to express subjective sense of shortness of breath during exertion;Long Term: Able to use Dyspnea scale to guide intensity level when exercising independently       Knowledge and understanding of Target Heart Rate Range (THRR) Yes       Intervention Provide education and explanation of THRR including how the numbers were predicted and where they are located for reference       Expected Outcomes Short Term: Able to state/look up THRR;Long Term: Able to use THRR to govern intensity when exercising independently;Short Term: Able to use  daily as guideline for intensity in rehab       Able to check pulse independently Yes       Intervention Provide education and demonstration on how to check pulse in carotid and radial arteries.;Review the importance of being able to check your own pulse for safety during independent exercise       Expected Outcomes Short Term: Able to explain why pulse checking is important during independent exercise;Long Term: Able to check pulse independently and accurately       Understanding of Exercise Prescription Yes       Intervention Provide education, explanation, and written materials on patient's individual exercise prescription       Expected Outcomes Short Term: Able to explain program exercise prescription;Long Term: Able to explain home exercise prescription to exercise independently          Exercise Goals Re-Evaluation :     Exercise Goals Re-Evaluation    Row Name 12/15/16 0754 12/18/16 1342           Exercise Goal Re-Evaluation   Exercise Goals Review Understanding of Exercise Prescription;Knowledge and understanding of Target Heart Rate Range (THRR);Able to understand and use rate of perceived exertion (RPE) scale Increase Physical Activity;Increase Strength and Stamina      Comments Reviewed RPE scale, THR and program prescription with pt today.  Pt voiced understanding and was given a copy of goals to take home.  Carlos Brewer has completed his first full day of exercise.  We will continue to monitor his progression throughout the program.      Expected Outcomes Short: Use RPE daily to regulate intensity.  Long: Follow program prescription in THR. Short: Attend classes regularly.  Long: Continue to follow program prescription.          Discharge Exercise Prescription (Final Exercise Prescription Changes):     Exercise Prescription Changes - 12/18/16 1300      Response to Exercise   Blood Pressure (Admit) 126/78   Blood Pressure (Exercise) 154/80   Blood Pressure (Exit) 114/60    Heart Rate (Admit) 83 bpm   Heart Rate (Exercise) 131 bpm   Heart Rate (Exit) 98 bpm   Rating of Perceived Exertion (Exercise) 12   Symptoms none   Comments first full day of exercise   Duration Progress to 45  minutes of aerobic exercise without signs/symptoms of physical distress   Intensity THRR unchanged     Progression   Progression Continue to progress workloads to maintain intensity without signs/symptoms of physical distress.   Average METs 4.5     Resistance Training   Training Prescription Yes   Weight 5 lb.   Reps 10-15     Interval Training   Interval Training No     Treadmill   MPH 3   Grade 4   Minutes 15   METs 4.95     REL-XR   Level 8   Minutes 15   METs 4.1      Nutrition:  Target Goals: Understanding of nutrition guidelines, daily intake of sodium <1531m, cholesterol <2068m calories 30% from fat and 7% or less from saturated fats, daily to have 5 or more servings of fruits and vegetables.  Biometrics:     Pre Biometrics - 12/11/16 1436      Pre Biometrics   Height 6' (1.829 m)   Weight 282 lb 4.8 oz (128.1 kg)   Waist Circumference 47.5 inches   Hip Circumference 50 inches   Waist to Hip Ratio 0.95 %   BMI (Calculated) 38.28   Single Leg Stand 30 seconds       Nutrition Therapy Plan and Nutrition Goals:     Nutrition Therapy & Goals - 12/25/16 1356      Nutrition Therapy   RD appointment defered Yes  for now      Nutrition Discharge: Rate Your Plate Scores:     Nutrition Assessments - 12/11/16 1408      MEDFICTS Scores   Pre Score 42      Nutrition Goals Re-Evaluation:   Nutrition Goals Discharge (Final Nutrition Goals Re-Evaluation):   Psychosocial: Target Goals: Acknowledge presence or absence of significant depression and/or stress, maximize coping skills, provide positive support system. Participant is able to verbalize types and ability to use techniques and skills needed for reducing stress and depression.    Initial Review & Psychosocial Screening:     Initial Psych Review & Screening - 12/11/16 1414      Initial Review   Current issues with Current Depression;History of Depression;Current Anxiety/Panic;Current Psychotropic Meds;Current Sleep Concerns;Current Stress Concerns   Source of Stress Concerns Poor Coping Skills;Financial;Chronic Illness;Unable to participate in former interests or hobbies   Comments LaVinayakas a history of insomnia dating back to his days in the [employer] with sleep medicine, he still usually sleeps only about 3 hours a night. For his depression, he was put on Zoloft and it was originally helping, but now he is starting to feel more down lately. He wants to see if this program helps, but is stressed about other issues in his life that he did not want to talk about at this time.      Family Dynamics   Good Support System? Yes  Girlfriend and Mom      Screening Interventions   Interventions Yes;Encouraged to exercise;Program counselor consult   Expected Outcomes Short Term goal: Utilizing psychosocial counselor, staff and physician to assist with identification of specific Stressors or current issues interfering with healing process. Setting desired goal for each stressor or current issue identified.;Long Term Goal: Stressors or current issues are controlled or eliminated.;Short Term goal: Identification and review with participant of any Quality of Life or Depression concerns found by scoring the questionnaire.;Long Term goal: The participant improves quality of Life and PHQ9 Scores as seen by post scores  and/or verbalization of changes      Quality of Life Scores:      Quality of Life - 12/11/16 1420      Quality of Life Scores   Health/Function Pre 21 %   Socioeconomic Pre 19.71 %   Psych/Spiritual Pre 20.29 %   Family Pre 20.2 %   GLOBAL Pre 20.47 %      PHQ-9: Recent Review Flowsheet Data    Depression screen [facility] 2/9 12/11/2016 11/05/2016   Decreased  Interest 2 0   Down, Depressed, Hopeless 3 0   PHQ - 2 Score 5 0   Altered sleeping 3 -   Tired, decreased energy 2 -   Change in appetite 0 -   Feeling bad or failure about yourself  3 -   Trouble concentrating 3 -   Moving slowly or fidgety/restless 0 -   Suicidal thoughts 1 -   PHQ-9 Score 17 -   Difficult doing work/chores Extremely dIfficult  -     Interpretation of Total Score  Total Score Depression Severity:  1-4 = Minimal depression, 5-9 = Mild depression, 10-14 = Moderate depression, 15-19 = Moderately severe depression, 20-27 = Severe depression   Psychosocial Evaluation and Intervention:     Psychosocial Evaluation - 12/15/16 0947      Psychosocial Evaluation & Interventions   Interventions Encouraged to exercise with the program and follow exercise prescription;Relaxation education;Stress management education   Comments Counselor met with Mr. Hincapie Ma Rings) today for initial psychosocial evaluation.  He is a 31 year old who is experiencing low-ejection fraction and some symptoms of Angina.  He has a limited support system with a mother and significant other he lives with.  He is an [place] who has experienced PTSD symptoms since then; with symptoms of problems sleeping; feeling bad about himself; and depressive symptoms with his PHQ-9 being a "17" indicating moderately severe depression. He is on medications for mood and for sleep reporting the Zoloft has been increased and is being helpful; but the sleep meds are "hit or miss" with an average of (3) hours per night of sleep.  He admits to multiple stressors currently with finances and concerns about losing his job if he doesn't return soon.  He has goals to increase his confidence in his ability to work out regularly and decrease his anxiety about his health issues and ability to work out.  Counselor encouraged Guerin to consider a group for PTSD through the [facility] to develop skills to cope better.  Counselor will be  following with Ma Rings throughout the course of this program.    Expected Outcomes [place] will benefit from consistent exercise to achieve his stated goals.  The educational and psychoeducational components will be helpful in understanding and learning to cope better with his health and mental health condition.  [facility] possible support groups for PTSD and consider attending.  Counselor will follow.   Continue Psychosocial Services  Follow up required by counselor      Psychosocial Re-Evaluation:   Psychosocial Discharge (Final Psychosocial Re-Evaluation):   Vocational Rehabilitation: Provide vocational rehab assistance to qualifying candidates.   Vocational Rehab Evaluation & Intervention:     Vocational Rehab - 12/11/16 1421      Initial Vocational Rehab Evaluation & Intervention   Assessment shows need for Vocational Rehabilitation No      Education: Education Goals: Education classes will be provided on a variety of topics geared toward better understanding of heart health  and risk factor modification. Participant will state understanding/return demonstration of topics presented as noted by education test scores.  Learning Barriers/Preferences:     Learning Barriers/Preferences - 12/11/16 1420      Learning Barriers/Preferences   Learning Barriers None   Learning Preferences None      Education Topics: General Nutrition Guidelines/Fats and Fiber: -Group instruction provided by verbal, written material, models and posters to present the general guidelines for heart healthy nutrition. Gives an explanation and review of dietary fats and fiber.   Controlling Sodium/Reading Food Labels: -Group verbal and written material supporting the discussion of sodium use in heart healthy nutrition. Review and explanation with models, verbal and written materials for utilization of the food label.   Exercise Physiology & Risk Factors: - Group verbal and  written instruction with models to review the exercise physiology of the cardiovascular system and associated critical values. Details cardiovascular disease risk factors and the goals associated with each risk factor.   Aerobic Exercise & Resistance Training: - Gives group verbal and written discussion on the health impact of inactivity. On the components of aerobic and resistive training programs and the benefits of this training and how to safely progress through these programs.   Flexibility, Balance, General Exercise Guidelines: - Provides group verbal and written instruction on the benefits of flexibility and balance training programs. Provides general exercise guidelines with specific guidelines to those with heart or lung disease. Demonstration and skill practice provided.   Stress Management: - Provides group verbal and written instruction about the health risks of elevated stress, cause of high stress, and healthy ways to reduce stress.   Depression: - Provides group verbal and written instruction on the correlation between heart/lung disease and depressed mood, treatment options, and the stigmas associated with seeking treatment.   Anatomy & Physiology of the Heart: - Group verbal and written instruction and models provide basic cardiac anatomy and physiology, with the coronary electrical and arterial systems. Review of: AMI, Angina, Valve disease, Heart Failure, Cardiac Arrhythmia, Pacemakers, and the ICD.   Cardiac Procedures: - Group verbal and written instruction to review commonly prescribed medications for heart disease. Reviews the medication, class of the drug, and side effects. Includes the steps to properly store meds and maintain the prescription regimen. (beta blockers and nitrates)   Cardiac Medications I: - Group verbal and written instruction to review commonly prescribed medications for heart disease. Reviews the medication, class of the drug, and side effects.  Includes the steps to properly store meds and maintain the prescription regimen.   Cardiac Rehab from 12/15/2016 in [facility] Cardiac and Pulmonary Rehab  Date  12/15/16  Educator  [facility]  Instruction Review Code  1- Verbalizes Understanding      Cardiac Medications II: -Group verbal and written instruction to review commonly prescribed medications for heart disease. Reviews the medication, class of the drug, and side effects. (all other drug classes)    Go Sex-Intimacy & Heart Disease, Get SMART - Goal Setting: - Group verbal and written instruction through game format to discuss heart disease and the return to sexual intimacy. Provides group verbal and written material to discuss and apply goal setting through the application of the S.M.A.R.T. Method.   Other Matters of the Heart: - Provides group verbal, written materials and models to describe Heart Failure, Angina, Valve Disease, Peripheral Artery Disease, and Diabetes in the realm of heart disease. Includes description of the disease process and treatment options available to the cardiac patient.   Exercise &  Equipment Safety: - Individual verbal instruction and demonstration of equipment use and safety with use of the equipment.   Cardiac Rehab from 12/15/2016 in [facility] Cardiac and Pulmonary Rehab  Date  12/11/16  Educator  [place]  Instruction Review Code  1- Verbalizes Understanding      Infection Prevention: - Provides verbal and written material to individual with discussion of infection control including proper hand washing and proper equipment cleaning during exercise session.   Cardiac Rehab from 12/15/2016 in [facility] Cardiac and Pulmonary Rehab  Date  12/11/16  Educator  [place]  Instruction Review Code  1- Verbalizes Understanding      Falls Prevention: - Provides verbal and written material to individual with discussion of falls prevention and safety.   Cardiac Rehab from 12/15/2016 in [facility] Cardiac and Pulmonary Rehab  Date  12/11/16   Educator  [place]  Instruction Review Code  1- Verbalizes Understanding      Diabetes: - Individual verbal and written instruction to review signs/symptoms of diabetes, desired ranges of glucose level fasting, after meals and with exercise. Acknowledge that pre and post exercise glucose checks will be done for 3 sessions at entry of program.   Other: -Provides group and verbal instruction on various topics (see comments)    Knowledge Questionnaire Score:     Knowledge Questionnaire Score - 12/11/16 1420      Knowledge Questionnaire Score   Pre Score 22/28  Correct answers reviewed with Duvid      Core Components/Risk Factors/Patient Goals at Admission:     Personal Goals and Risk Factors at Admission - 12/11/16 1353      Core Components/Risk Factors/Patient Goals on Admission    Weight Management Yes;Weight Loss;Obesity   Intervention Weight Management: Develop a combined nutrition and exercise program designed to reach desired caloric intake, while maintaining appropriate intake of nutrient and fiber, sodium and fats, and appropriate energy expenditure required for the weight goal.;Weight Management: Provide education and appropriate resources to help participant work on and attain dietary goals.;Weight Management/Obesity: Establish reasonable short term and long term weight goals.;Obesity: Provide education and appropriate resources to help participant work on and attain dietary goals.   Admit Weight 282 lb 4.8 oz (128.1 kg)   Goal Weight: Short Term 278 lb (126.1 kg)   Goal Weight: Long Term 240 lb (108.9 kg)   Expected Outcomes Short Term: Continue to assess and modify interventions until short term weight is achieved;Long Term: Adherence to nutrition and physical activity/exercise program aimed toward attainment of established weight goal;Weight Loss: Understanding of general recommendations for a balanced deficit meal plan, which promotes 1-2 lb weight loss per week and  includes a negative energy balance of [phone] kcal/d;Understanding recommendations for meals to include 15-35% energy as protein, 25-35% energy from fat, 35-60% energy from carbohydrates, less than 222m of dietary cholesterol, 20-35 gm of total fiber daily;Understanding of distribution of calorie intake throughout the day with the consumption of 4-5 meals/snacks   Heart Failure Yes   Intervention Provide a combined exercise and nutrition program that is supplemented with education, support and counseling about heart failure. Directed toward relieving symptoms such as shortness of breath, decreased exercise tolerance, and extremity edema.   Expected Outcomes Improve functional capacity of life;Short term: Attendance in program 2-3 days a week with increased exercise capacity. Reported lower sodium intake. Reported increased fruit and vegetable intake. Reports medication compliance.;Short term: Daily weights obtained and reported for increase. Utilizing diuretic protocols set by physician.;Long term: Adoption of self-care skills and  reduction of barriers for early signs and symptoms recognition and intervention leading to self-care maintenance.   Stress Yes  He was surprised by his new heart event. He also stated having issues with poor coping skills, and financial issues.    Intervention Offer individual and/or small group education and counseling on adjustment to heart disease, stress management and health-related lifestyle change. Teach and support self-help strategies.;Refer participants experiencing significant psychosocial distress to appropriate mental health specialists for further evaluation and treatment. When possible, include family members and significant others in education/counseling sessions.   Expected Outcomes Short Term: Participant demonstrates changes in health-related behavior, relaxation and other stress management skills, ability to obtain effective social support, and compliance with  psychotropic medications if prescribed.;Long Term: Emotional wellbeing is indicated by absence of clinically significant psychosocial distress or social isolation.      Core Components/Risk Factors/Patient Goals Review:    Core Components/Risk Factors/Patient Goals at Discharge (Final Review):    ITP Comments:     ITP Comments    Row Name 12/11/16 1347 12/24/16 0628         ITP Comments Med Review Completed. Initial ITP created. Diagnosis can be found in Office visit 11/07/16 30 day Review. Continue with ITP unless directed changes per Medical Director Review.          Comments:

## 2016-12-29 ENCOUNTER — Encounter: Payer: Commercial Managed Care - PPO | Admitting: *Deleted

## 2016-12-29 DIAGNOSIS — I214 Non-ST elevation (NSTEMI) myocardial infarction: Secondary | ICD-10-CM

## 2016-12-29 DIAGNOSIS — I5022 Chronic systolic (congestive) heart failure: Secondary | ICD-10-CM

## 2016-12-29 NOTE — Progress Notes (Signed)
Daily Session Note  Patient Details  Name: Carlos Brewer. MRN: [id number] Date of Birth: [birth year] Referring Provider:     Cardiac Rehab from 12/11/2016 in [facility]  Referring Provider  [place]      Encounter Date: 12/29/2016  Check In:     Session Check In - 12/29/16 0836      Check-In   Location [facility]-Cardiac & Pulmonary Rehab   Staff Present Earlean Shawl, BS, ACSM CEP, Exercise Physiologist;Carroll Enterkin, RN, BSN;Laureen Owens Shark, BS, RRT, Respiratory Therapist   Supervising physician immediately available to respond to emergencies See telemetry face sheet for immediately available ER MD   Medication changes reported     No   Fall or balance concerns reported    No   Warm-up and Cool-down Performed on first and last piece of equipment   Resistance Training Performed Yes   VAD Patient? No     Pain Assessment   Currently in Pain? No/denies   Multiple Pain Sites No         History  Smoking Status  . Former Smoker  . Packs/day: 0.25  . Types: Cigarettes  . Quit date: 10/23/2016  Smokeless Tobacco  . Never Used    Goals Met:  Independence with exercise equipment Exercise tolerated well No report of cardiac concerns or symptoms Strength training completed today  Goals Unmet:  Not Applicable  Comments: Pt able to follow exercise prescription today without complaint.  Will continue to monitor for progression.    Dr. Emily Filbert is Medical Director for [facility].

## 2017-01-08 ENCOUNTER — Telehealth: Payer: Self-pay | Admitting: Cardiovascular Disease

## 2017-01-08 NOTE — Telephone Encounter (Signed)
-----   Message from Herminio Commons sent at 01/08/2017  9:00 AM EDT ----- Regarding: RE: FMLA  Contact: [phone] Good Morning,  Forms have been scanned into Epic. On 11/28/16 forms were faxed and originals mailed to Mr. Helget. He should have the fax confirmation and the form copies. If further assistance is needed just let me know.  Thanks, Glenard Haring ----- Message ----- From: Rudi Coco Sent: 01/08/2017   8:37 AM To: Herminio Commons Subject: FMLA                                           Morning,  I am trying to follow up on a request we received and finished on patient above.  The company called yesterday stating they never received any paper work for patient on his [place] like we sent it to [place] on 11/26/16   Would you be able to help [place] locate the competed forms  [place]

## 2017-01-08 NOTE — Telephone Encounter (Signed)
Lmov for patient to be aware of below.

## 2017-01-09 ENCOUNTER — Emergency Department
Admission: EM | Admit: 2017-01-09 | Discharge: 2017-01-09 | Disposition: A | Discharge status: Home / Self Care | Payer: Commercial Managed Care - PPO | Attending: Emergency Medicine | Admitting: Emergency Medicine

## 2017-01-09 ENCOUNTER — Encounter: Payer: Self-pay | Admitting: Emergency Medicine

## 2017-01-09 ENCOUNTER — Emergency Department: Payer: Commercial Managed Care - PPO

## 2017-01-09 DIAGNOSIS — Z7982 Long term (current) use of aspirin: Secondary | ICD-10-CM | POA: Insufficient documentation

## 2017-01-09 DIAGNOSIS — Z87891 Personal history of nicotine dependence: Secondary | ICD-10-CM | POA: Insufficient documentation

## 2017-01-09 DIAGNOSIS — Z79899 Other long term (current) drug therapy: Secondary | ICD-10-CM | POA: Insufficient documentation

## 2017-01-09 DIAGNOSIS — E876 Hypokalemia: Secondary | ICD-10-CM

## 2017-01-09 DIAGNOSIS — R079 Chest pain, unspecified: Secondary | ICD-10-CM | POA: Diagnosis present

## 2017-01-09 DIAGNOSIS — I509 Heart failure, unspecified: Secondary | ICD-10-CM | POA: Insufficient documentation

## 2017-01-09 LAB — CBC
HCT: 41.7 % (ref 40.0–52.0)
Hemoglobin: 14.3 g/dL (ref 13.0–18.0)
MCH: 30.7 pg (ref 26.0–34.0)
MCHC: 34.2 g/dL (ref 32.0–36.0)
MCV: 89.6 fL (ref 80.0–100.0)
PLATELETS: 286 10*3/uL (ref 150–440)
RBC: 4.65 MIL/uL (ref 4.40–5.90)
RDW: 12.5 % (ref 11.5–14.5)
WBC: 6.9 10*3/uL (ref 3.8–10.6)

## 2017-01-09 LAB — BASIC METABOLIC PANEL
Anion gap: 12 (ref 5–15)
BUN: 18 mg/dL (ref 6–20)
CHLORIDE: 103 mmol/L (ref 101–111)
CO2: 27 mmol/L (ref 22–32)
Calcium: 9.5 mg/dL (ref 8.9–10.3)
Creatinine, Ser: 1.26 mg/dL — ABNORMAL HIGH (ref 0.61–1.24)
Glucose, Bld: 115 mg/dL — ABNORMAL HIGH (ref 65–99)
Potassium: 3.3 mmol/L — ABNORMAL LOW (ref 3.5–5.1)
SODIUM: 142 mmol/L (ref 135–145)

## 2017-01-09 LAB — TROPONIN I

## 2017-01-09 MED ORDER — POTASSIUM CHLORIDE CRYS ER 20 MEQ PO TBCR
20.0000 meq | EXTENDED_RELEASE_TABLET | Freq: Once | ORAL | Status: AC
Start: 2017-01-09 — End: 2017-01-09
  Administered 2017-01-09: 20 meq via ORAL
  Filled 2017-01-09: qty 1

## 2017-01-09 MED ORDER — GI COCKTAIL ~~LOC~~
30.0000 mL | Freq: Once | ORAL | Status: AC
  Administered 2017-01-09: 30 mL via ORAL
  Filled 2017-01-09: qty 30

## 2017-01-09 NOTE — Discharge Instructions (Signed)
You are evaluated for chest discomfort, and although no certain cause was found, your exam and evaluation are overall reassuring in the emergency department today.  Return to the emergency department immediately for any worsening or new chest pain, or any associated nausea, sweats, dizziness, passing out, weakness, numbness, confusion or altered mental status, fevers, or any other symptoms concerning to you.

## 2017-01-09 NOTE — ED Triage Notes (Signed)
FIRST NURSE NOTE-c/o CP. Talking on cell phone at check in. Ambulatory. No distress.

## 2017-01-09 NOTE — ED Provider Notes (Signed)
[facility] Emergency Department Provider Note ____________________________________________   I have reviewed the triage vital signs and the triage nursing note.  HISTORY  Chief Complaint Chest Pain   Historian Patient  HPI Carlos Brewer. is a 31 y.o. male with a past medical history positive for CHF, morbid obesity, tobacco abuse, nonischemic cardiomyopathy, systolic heart failure, prior heart attack, as well as anxiety and PTSD from [place] war, presents today with chest discomfort.  He follows with Dr. Rockey Situ for cardiology.  He states he almost every day has left-sided twinges of chest discomfort.  He states he had nitroglycerin prescribed, but he really does not take it anymore.  What was different today is that he woke up this morning with a feeling of pressure or fullness in the epigastrium or lower part of the chest.  When he was found to have a heart attack he felt like there was a bar across his entire chest and so in some ways the symptoms feel similar, however its in a much smaller area.  States that he has not previously been diagnosed with indigestion or acid reflux, but he was questioning himself was not symptoms could be related to that.  In any case, he is pretty anxious about any chest discomfort or symptoms given his cardiac history.  Discomfort is considered mild, nothing makes it worse or better.  No associated symptoms of shortness of breath, coughing, nausea, sweats, dizziness or passing out or weakness.   Past Medical History:  Diagnosis Date  . Acute infective myocarditis 10/2016  . CHF (congestive heart failure) ([place])   . Chronic CHF (congestive heart failure) ([place])   . Community acquired pneumonia    a. Bilat - dx 10/12/2016.  . Morbid obesity ([place])   . Palpitations   . Tobacco abuse     Patient Active Problem List   Diagnosis Date Noted  . Nonischemic cardiomyopathy ([place]) 11/07/2016  . Chronic systolic heart failure ([place])  11/05/2016  . Anxiety 11/05/2016  . Substance use disorder 11/05/2016  . Acute systolic heart failure ([place])   . Pneumonia 10/24/2016    Past Surgical History:  Procedure Laterality Date  . CORONARY ANGIOPLASTY    . RIGHT/LEFT HEART CATH AND CORONARY ANGIOGRAPHY N/A 10/27/2016   Procedure: RIGHT/LEFT HEART CATH AND CORONARY ANGIOGRAPHY;  Surgeon: Wellington Hampshire, MD;  Location: [facility];  Service: Cardiovascular;  Laterality: N/A;  . WISDOM TOOTH EXTRACTION  2008    Prior to Admission medications   Medication Sig Start Date End Date Taking? Authorizing Provider  acetaminophen (TYLENOL) 500 MG tablet Take 2 tablets (1,000 mg total) by mouth every 6 (six) hours as needed. Patient taking differently: Take 1,000 mg by mouth every 6 (six) hours as needed for mild pain or fever.  10/12/16   Betancourt, Aura Fey, NP  aspirin EC 81 MG EC tablet Take 1 tablet (81 mg total) by mouth daily. 10/30/16   Demetrios Loll, MD  atorvastatin (LIPITOR) 40 MG tablet Take 1 tablet (40 mg total) by mouth daily at 6 PM. 10/29/16   Demetrios Loll, MD  carvedilol (COREG) 3.125 MG tablet Take 1 tablet (3.125 mg total) by mouth 2 (two) times daily with a meal. 10/29/16   Demetrios Loll, MD  fluticasone [facility]) 50 MCG/ACT nasal spray Place 1 spray into both nostrils 2 (two) times daily. 10/12/16   Betancourt, Aura Fey, NP  loratadine (CLARITIN) 10 MG tablet Take 1 tablet (10 mg total) by mouth daily. 10/12/16  Betancourt, Aura Fey, NP  mupirocin ointment (BACTROBAN) 2 % Apply 1 application topically 2 (two) times daily. Patient not taking: Reported on 12/11/2016 10/20/16   Frederich Cha, MD  nitroGLYCERIN (NITROSTAT) 0.4 MG SL tablet Place 1 tablet (0.4 mg total) under the tongue every 5 (five) minutes as needed for chest pain. 10/29/16   Demetrios Loll, MD  sacubitril-valsartan (ENTRESTO) 24-26 MG Take 1 tablet by mouth 2 (two) times daily. 12/05/16   Minna Merritts, MD  sertraline (ZOLOFT) 100 MG tablet TK 1 T PO QD 11/12/16    [provider]  sertraline (ZOLOFT) 25 MG tablet Take 1 tablet (25 mg total) by mouth daily. Patient not taking: Reported on 12/11/2016 10/30/16   Demetrios Loll, MD  spironolactone (ALDACTONE) 25 MG tablet Take 25 mg by mouth daily.    [provider]  torsemide (DEMADEX) 20 MG tablet Take 2 tablets (40 mg total) by mouth 2 (two) times daily. 11/07/16   Minna Merritts, MD  traZODone (DESYREL) 50 MG tablet Take 1 tablet (50 mg total) by mouth at bedtime. 10/29/16   Demetrios Loll, MD    No Known Allergies  Family History  Problem Relation Age of Onset  . Healthy Mother   . Healthy Father     Social History Social History  Substance Use Topics  . Smoking status: Former Smoker    Packs/day: 0.25    Types: Cigarettes    Quit date: 10/23/2016  . Smokeless tobacco: Never Used  . Alcohol use No     Comment: none    Review of Systems  Constitutional: Negative for fever. Eyes: Negative for visual changes. ENT: Negative for sore throat. Cardiovascular: Positive for chest pain. Respiratory: Negative for shortness of breath. Gastrointestinal: Negative for abdominal pain, vomiting and diarrhea. Genitourinary: Negative for dysuria. Musculoskeletal: Negative for back pain. Skin: Negative for rash. Neurological: Negative for headache.  ____________________________________________   PHYSICAL EXAM:  VITAL SIGNS: ED Triage Vitals  Enc Vitals Group     BP 01/09/17 1543 134/84     Pulse Rate 01/09/17 1543 88     Resp 01/09/17 1543 16     Temp 01/09/17 1543 (!) 97.3 F (36.3 C)     Temp Source 01/09/17 1543 Oral     SpO2 01/09/17 1543 97 %     Weight 01/09/17 1544 282 lb (127.9 kg)     Height 01/09/17 1544 6' (1.829 m)     Head Circumference --      Peak Flow --      Pain Score 01/09/17 1543 7     Pain Loc --      Pain Edu? --      Excl. in [place]? --      Constitutional: Alert and oriented. Well appearing and in no distress. HEENT   Head: Normocephalic and  atraumatic.      Eyes: Conjunctivae are normal. Pupils equal and round.       Ears:         Nose: No congestion/rhinnorhea.   Mouth/Throat: Mucous membranes are moist.   Neck: No stridor. Cardiovascular/Chest: Normal rate, regular rhythm.  No murmurs, rubs, or gallops. Respiratory: Normal respiratory effort without tachypnea nor retractions. Breath sounds are clear and equal bilaterally. No wheezes/rales/rhonchi. Gastrointestinal: Soft. No distention, no guarding, no rebound. Nontender.  Obese. Genitourinary/rectal:Deferred Musculoskeletal: Nontender with normal range of motion in all extremities. No joint effusions.  No lower extremity tenderness.  No edema. Neurologic:  Normal speech and language. No gross  or focal neurologic deficits are appreciated. Skin:  Skin is warm, dry and intact. No rash noted. Psychiatric: Mood and affect are normal. Speech and behavior are normal. Patient exhibits appropriate insight and judgment.   ____________________________________________  LABS (pertinent positives/negatives) I, Lisa Roca, MD the attending physician have reviewed the labs noted below.  Labs Reviewed  BASIC METABOLIC PANEL - Abnormal; Notable for the following:       Result Value   Potassium 3.3 (*)    Glucose, Bld 115 (*)    Creatinine, Ser 1.26 (*)    All other components within normal limits  CBC  TROPONIN I    ____________________________________________    EKG I, Lisa Roca, MD, the attending physician have personally viewed and interpreted all ECGs.  86 bpm.  Normal sinus rhythm.  Narrow QRS.  Normal axis.  Nonspecific T wave.  T wave anteriorly. ____________________________________________  RADIOLOGY All Xrays were viewed by me.  Imaging interpreted by Radiologist, and I, Lisa Roca, MD the attending physician have reviewed the radiologist interpretation noted below.  Chest x-ray two-view: Normal  chest. __________________________________________  PROCEDURES  Procedure(s) performed: None  Critical Care performed: None  ____________________________________________  No current facility-administered medications on file prior to encounter.    Current Outpatient Prescriptions on File Prior to Encounter  Medication Sig Dispense Refill  . acetaminophen (TYLENOL) 500 MG tablet Take 2 tablets (1,000 mg total) by mouth every 6 (six) hours as needed. (Patient taking differently: Take 1,000 mg by mouth every 6 (six) hours as needed for mild pain or fever. ) 30 tablet 0  . aspirin EC 81 MG EC tablet Take 1 tablet (81 mg total) by mouth daily. 30 tablet 2  . atorvastatin (LIPITOR) 40 MG tablet Take 1 tablet (40 mg total) by mouth daily at 6 PM. 30 tablet 2  . carvedilol (COREG) 3.125 MG tablet Take 1 tablet (3.125 mg total) by mouth 2 (two) times daily with a meal. 60 tablet 2  . fluticasone (FLONASE) 50 MCG/ACT nasal spray Place 1 spray into both nostrils 2 (two) times daily. 16 g 0  . loratadine (CLARITIN) 10 MG tablet Take 1 tablet (10 mg total) by mouth daily. 30 tablet 1  . mupirocin ointment (BACTROBAN) 2 % Apply 1 application topically 2 (two) times daily. (Patient not taking: Reported on 12/11/2016) 22 g 0  . nitroGLYCERIN (NITROSTAT) 0.4 MG SL tablet Place 1 tablet (0.4 mg total) under the tongue every 5 (five) minutes as needed for chest pain. 30 tablet 2  . sacubitril-valsartan (ENTRESTO) 24-26 MG Take 1 tablet by mouth 2 (two) times daily. 60 tablet 3  . sertraline (ZOLOFT) 100 MG tablet TK 1 T PO QD  2  . sertraline (ZOLOFT) 25 MG tablet Take 1 tablet (25 mg total) by mouth daily. (Patient not taking: Reported on 12/11/2016) 30 tablet 0  . spironolactone (ALDACTONE) 25 MG tablet Take 25 mg by mouth daily.    Marland Kitchen torsemide (DEMADEX) 20 MG tablet Take 2 tablets (40 mg total) by mouth 2 (two) times daily. 120 tablet 7  . traZODone (DESYREL) 50 MG tablet Take 1 tablet (50 mg total) by mouth  at bedtime. 30 tablet 0    ____________________________________________  ED COURSE / ASSESSMENT AND PLAN  Pertinent labs & imaging results that were available during my care of the patient were reviewed by me and considered in my medical decision making (see chart for details).    Carlos Brewer is overall well-appearing and states that his discomfort  is mild, but because of his cardiac history, he wanted to be evaluated.  His EKG is reassuring overall.  His laboratory studies are overall reassuring.  His troponin is negative and symptoms have been going on all day at this point, I doubt that he needs repeat testing.  Does not seem consistent with other emergency causes of chest or epigastric/abd pain.   He is quite anxious, both underlying symptoms with his PTSD, but certainly with having had cardiac issues, he is hypersensitive to chest discomfort however mild.  We discussed and he is reassured with his evaluation today.  He will follow-up with his cardiologist.  We discussed return precautions.  DIFFERENTIAL DIAGNOSIS: Differential diagnosis includes, but is not limited to, ACS, aortic dissection, pulmonary embolism, cardiac tamponade, pneumothorax, pneumonia, pericarditis/myocarditis, GI-related causes including esophagitis/gastritis, and musculoskeletal chest wall pain.    CONSULTATIONS:   None   Patient / Family / Caregiver informed of clinical course, medical decision-making process, and agree with plan.   I discussed return precautions, follow-up instructions, and discharge instructions with patient and/or family.  Discharge Instructions : You are evaluated for chest discomfort, and although no certain cause was found, your exam and evaluation are overall reassuring in the emergency department today.  Return to the emergency department immediately for any worsening or new chest pain, or any associated nausea, sweats, dizziness, passing out, weakness, numbness, confusion or altered  mental status, fevers, or any other symptoms concerning to you.  ___________________________________________   FINAL CLINICAL IMPRESSION(S) / ED DIAGNOSES   Final diagnoses:  Nonspecific chest pain  Hypokalemia              Note: This dictation was prepared with Dragon dictation. Any transcriptional errors that result from this process are unintentional    Lisa Roca, MD 01/09/17 1701

## 2017-01-09 NOTE — ED Triage Notes (Signed)
Pt to ED via POV c/o central chest pain since this morning. Pt states that he has hx/o MI, CHF, and myocarditis. Pt denies radiation of pain or any associated symptoms. Pt is in NAD at this time.

## 2017-01-14 ENCOUNTER — Encounter: Payer: Self-pay | Admitting: *Deleted

## 2017-01-14 ENCOUNTER — Telehealth: Payer: Self-pay | Admitting: *Deleted

## 2017-01-14 DIAGNOSIS — I214 Non-ST elevation (NSTEMI) myocardial infarction: Secondary | ICD-10-CM

## 2017-01-14 DIAGNOSIS — I5022 Chronic systolic (congestive) heart failure: Secondary | ICD-10-CM

## 2017-01-14 NOTE — Telephone Encounter (Signed)
Called to check on status of return.  Left voicemail.  According to chart, pt was in ED last week for atypical chest pain with no known cause and is to follow up as outpatient.

## 2017-01-16 ENCOUNTER — Encounter: Payer: Commercial Managed Care - PPO | Attending: Cardiovascular Disease

## 2017-01-16 DIAGNOSIS — Z7982 Long term (current) use of aspirin: Secondary | ICD-10-CM | POA: Insufficient documentation

## 2017-01-16 DIAGNOSIS — I5022 Chronic systolic (congestive) heart failure: Secondary | ICD-10-CM | POA: Insufficient documentation

## 2017-01-16 DIAGNOSIS — Z87891 Personal history of nicotine dependence: Secondary | ICD-10-CM | POA: Insufficient documentation

## 2017-01-16 DIAGNOSIS — I214 Non-ST elevation (NSTEMI) myocardial infarction: Secondary | ICD-10-CM | POA: Insufficient documentation

## 2017-01-16 DIAGNOSIS — Z79899 Other long term (current) drug therapy: Secondary | ICD-10-CM | POA: Insufficient documentation

## 2017-01-16 DIAGNOSIS — F1990 Other psychoactive substance use, unspecified, uncomplicated: Secondary | ICD-10-CM | POA: Insufficient documentation

## 2017-01-21 ENCOUNTER — Encounter: Payer: Self-pay | Admitting: *Deleted

## 2017-01-21 DIAGNOSIS — I5022 Chronic systolic (congestive) heart failure: Secondary | ICD-10-CM

## 2017-01-21 NOTE — Progress Notes (Signed)
Cardiac Individual Treatment Plan  Patient Details  Name: Carlos Brewer. MRN: [id number] Date of Birth: 11-Aug-1985 Referring Provider:     Cardiac Rehab from 12/11/2016 in [facility] Cardiac and Pulmonary Rehab  Referring Provider  Gollan      Initial Encounter Date:    Cardiac Rehab from 12/11/2016 in [facility] Cardiac and Pulmonary Rehab  Date  12/11/16  Referring Provider  Rockey Situ      Visit Diagnosis: Heart failure, chronic systolic ([place])  Patient's Home Medications on Admission:  Current Outpatient Medications:  .  acetaminophen (TYLENOL) 500 MG tablet, Take 2 tablets (1,000 mg total) by mouth every 6 (six) hours as needed. (Patient taking differently: Take 1,000 mg by mouth every 6 (six) hours as needed for mild pain or fever. ), Disp: 30 tablet, Rfl: 0 .  aspirin EC 81 MG EC tablet, Take 1 tablet (81 mg total) by mouth daily., Disp: 30 tablet, Rfl: 2 .  atorvastatin (LIPITOR) 40 MG tablet, Take 1 tablet (40 mg total) by mouth daily at 6 PM., Disp: 30 tablet, Rfl: 2 .  carvedilol (COREG) 3.125 MG tablet, Take 1 tablet (3.125 mg total) by mouth 2 (two) times daily with a meal., Disp: 60 tablet, Rfl: 2 .  fluticasone (FLONASE) 50 MCG/ACT nasal spray, Place 1 spray into both nostrils 2 (two) times daily., Disp: 16 g, Rfl: 0 .  loratadine (CLARITIN) 10 MG tablet, Take 1 tablet (10 mg total) by mouth daily., Disp: 30 tablet, Rfl: 1 .  mupirocin ointment (BACTROBAN) 2 %, Apply 1 application topically 2 (two) times daily. (Patient not taking: Reported on 12/11/2016), Disp: 22 g, Rfl: 0 .  nitroGLYCERIN (NITROSTAT) 0.4 MG SL tablet, Place 1 tablet (0.4 mg total) under the tongue every 5 (five) minutes as needed for chest pain., Disp: 30 tablet, Rfl: 2 .  sacubitril-valsartan (ENTRESTO) 24-26 MG, Take 1 tablet by mouth 2 (two) times daily., Disp: 60 tablet, Rfl: 3 .  sertraline (ZOLOFT) 100 MG tablet, TK 1 T PO QD, Disp: , Rfl: 2 .  sertraline (ZOLOFT) 25 MG tablet, Take 1 tablet (25 mg total)  by mouth daily. (Patient not taking: Reported on 12/11/2016), Disp: 30 tablet, Rfl: 0 .  spironolactone (ALDACTONE) 25 MG tablet, Take 25 mg by mouth daily., Disp: , Rfl:  .  torsemide (DEMADEX) 20 MG tablet, Take 2 tablets (40 mg total) by mouth 2 (two) times daily., Disp: 120 tablet, Rfl: 7 .  traZODone (DESYREL) 50 MG tablet, Take 1 tablet (50 mg total) by mouth at bedtime., Disp: 30 tablet, Rfl: 0  Past Medical History: Past Medical History:  Diagnosis Date  . Acute infective myocarditis 10/2016  . CHF (congestive heart failure) ([place])   . Chronic CHF (congestive heart failure) ([place])   . Community acquired pneumonia    a. Bilat - dx 10/12/2016.  . Morbid obesity ([place])   . Palpitations   . Tobacco abuse     Tobacco Use: Social History   Tobacco Use  Smoking Status Former Smoker  . Packs/day: 0.25  . Types: Cigarettes  . Last attempt to quit: 10/23/2016  . Years since quitting: 0.2  Smokeless Tobacco Never Used    Labs: Recent Review Scientist, physiological    Labs for ITP Cardiac and Pulmonary Rehab Latest Ref Rng & Units 10/26/2016   Cholestrol 0 - 200 mg/dL 213(H)   LDLCALC 0 - 99 mg/dL 130(H)   HDL >40 mg/dL 62   Trlycerides <150 mg/dL 106  Exercise Target Goals:    Exercise Program Goal: Individual exercise prescription set with THRR, safety & activity barriers. Participant demonstrates ability to understand and report RPE using BORG scale, to self-measure pulse accurately, and to acknowledge the importance of the exercise prescription.  Exercise Prescription Goal: Starting with aerobic activity 30 plus minutes a day, 3 days per week for initial exercise prescription. Provide home exercise prescription and guidelines that participant acknowledges understanding prior to discharge.  Activity Barriers & Risk Stratification: Activity Barriers & Cardiac Risk Stratification - 12/11/16 1424      Activity Barriers & Cardiac Risk Stratification   Activity Barriers  Chest  Pain/Angina He does not like to take his Nitro because he does not like the side effects. He does take his Aspirin and says that tends to help if he were to have pain.    He does not like to take his Nitro because he does not like the side effects. He does take his Aspirin and says that tends to help if he were to have pain.    Cardiac Risk Stratification  High       6 Minute Walk: 6 Minute Walk    Row Name 12/11/16 1437         6 Minute Walk   Phase  Initial     Distance  1548 feet     Walk Time  6 minutes     # of Rest Breaks  0     MPH  2.93     METS  5.1     RPE  11     VO2 Peak  17.85     Symptoms  No     Resting HR  80 bpm     Resting BP  110/76     Resting Oxygen Saturation   98 %     Exercise Oxygen Saturation  during 6 min walk  98 %     Max Ex. HR  115 bpm     Max Ex. BP  154/92     2 Minute Post BP  116/74        Oxygen Initial Assessment:   Oxygen Re-Evaluation:   Oxygen Discharge (Final Oxygen Re-Evaluation):   Initial Exercise Prescription: Initial Exercise Prescription - 12/11/16 1400      Date of Initial Exercise RX and Referring Provider   Date  12/11/16    Referring Provider  Gollan      Treadmill   MPH  3    Grade  4    Minutes  15    METs  4.95      Recumbant Bike   Level  8    RPM  60    Watts  120    Minutes  15    METs  5      Elliptical   Level  1    Speed  3    Minutes  15    METs  5      REL-XR   Level  10    Speed  50    Minutes  15    METs  5      Prescription Details   Frequency (times per week)  3    Duration  Progress to 45 minutes of aerobic exercise without signs/symptoms of physical distress      Intensity   THRR 40-80% of Max Heartrate  123-166    Ratings of Perceived Exertion  11-15    Perceived Dyspnea  0-4      Resistance Training   Training Prescription  Yes    Weight  5 lb.    Reps  10-15       Perform Capillary Blood Glucose checks as needed.  Exercise Prescription Changes: Exercise  Prescription Changes    Row Name 12/11/16 1400 12/18/16 1300 12/31/16 1500         Response to Exercise   Blood Pressure (Admit)  110/76  126/78  138/80     Blood Pressure (Exercise)  154/94  154/80  160/88     Blood Pressure (Exit)  116/74  114/60  122/72     Heart Rate (Admit)  80 bpm  83 bpm  97 bpm     Heart Rate (Exercise)  115 bpm  131 bpm  133 bpm     Heart Rate (Exit)  77 bpm  98 bpm  96 bpm     Oxygen Saturation (Admit)  98 %  -  -     Oxygen Saturation (Exercise)  98 %  -  -     Rating of Perceived Exertion (Exercise)  11  12  12      Symptoms  -  none  none     Comments  -  first full day of exercise  -     Duration  -  Progress to 45 minutes of aerobic exercise without signs/symptoms of physical distress  Progress to 45 minutes of aerobic exercise without signs/symptoms of physical distress     Intensity  -  THRR unchanged  THRR unchanged       Progression   Progression  -  Continue to progress workloads to maintain intensity without signs/symptoms of physical distress.  Continue to progress workloads to maintain intensity without signs/symptoms of physical distress.     Average METs  -  4.5  4.1       Resistance Training   Training Prescription  -  Yes  Yes     Weight  -  5 lb.  5 lb     Reps  -  10-15  10-15       Interval Training   Interval Training  -  No  No       Treadmill   MPH  -  3  -     Grade  -  4  -     Minutes  -  15  -     METs  -  4.95  -       Recumbant Bike   Level  -  -  8     RPM  -  -  60     Watts  -  -  86     Minutes  -  -  15     METs  -  -  4.06       REL-XR   Level  -  8  10     Speed  -  -  50     Minutes  -  15  15     METs  -  4.1  4.2        Exercise Comments: Exercise Comments    Row Name 12/15/16 0755           Exercise Comments  First full day of exercise!  Patient was oriented to gym and equipment including functions, settings, policies, and procedures.  Patient's individual exercise prescription and treatment  plan were reviewed.  All starting workloads were established based on the results of the 6 minute walk test done at initial orientation visit.  The plan for exercise progression was also introduced and progression will be customized based on patient's performance and goals.          Exercise Goals and Review: Exercise Goals    Row Name 12/11/16 1439             Exercise Goals   Increase Physical Activity  Yes       Intervention  Provide advice, education, support and counseling about physical activity/exercise needs.;Develop an individualized exercise prescription for aerobic and resistive training based on initial evaluation findings, risk stratification, comorbidities and participant's personal goals.       Expected Outcomes  Achievement of increased cardiorespiratory fitness and enhanced flexibility, muscular endurance and strength shown through measurements of functional capacity and personal statement of participant.       Increase Strength and Stamina  Yes       Intervention  Provide advice, education, support and counseling about physical activity/exercise needs.;Develop an individualized exercise prescription for aerobic and resistive training based on initial evaluation findings, risk stratification, comorbidities and participant's personal goals.       Expected Outcomes  Achievement of increased cardiorespiratory fitness and enhanced flexibility, muscular endurance and strength shown through measurements of functional capacity and personal statement of participant.       Able to understand and use rate of perceived exertion (RPE) scale  Yes       Intervention  Provide education and explanation on how to use RPE scale       Expected Outcomes  Short Term: Able to use RPE daily in rehab to express subjective intensity level;Long Term:  Able to use RPE to guide intensity level when exercising independently       Able to understand and use Dyspnea scale  Yes       Intervention  Provide  education and explanation on how to use Dyspnea scale       Expected Outcomes  Short Term: Able to use Dyspnea scale daily in rehab to express subjective sense of shortness of breath during exertion;Long Term: Able to use Dyspnea scale to guide intensity level when exercising independently       Knowledge and understanding of Target Heart Rate Range (THRR)  Yes       Intervention  Provide education and explanation of THRR including how the numbers were predicted and where they are located for reference       Expected Outcomes  Short Term: Able to state/look up THRR;Long Term: Able to use THRR to govern intensity when exercising independently;Short Term: Able to use daily as guideline for intensity in rehab       Able to check pulse independently  Yes       Intervention  Provide education and demonstration on how to check pulse in carotid and radial arteries.;Review the importance of being able to check your own pulse for safety during independent exercise       Expected Outcomes  Short Term: Able to explain why pulse checking is important during independent exercise;Long Term: Able to check pulse independently and accurately       Understanding of Exercise Prescription  Yes       Intervention  Provide education, explanation, and written materials on patient's individual exercise prescription       Expected Outcomes  Short Term: Able to explain program exercise prescription;Long Term: Able to explain  home exercise prescription to exercise independently          Exercise Goals Re-Evaluation : Exercise Goals Re-Evaluation    Row Name 12/15/16 0754 12/18/16 1342 12/31/16 1547 01/14/17 1542       Exercise Goal Re-Evaluation   Exercise Goals Review  Understanding of Exercise Prescription;Knowledge and understanding of Target Heart Rate Range (THRR);Able to understand and use rate of perceived exertion (RPE) scale  Increase Physical Activity;Increase Strength and Stamina  Increase Physical  Activity;Increase Strength and Stamina  -    Comments  Reviewed RPE scale, THR and program prescription with pt today.  Pt voiced understanding and was given a copy of goals to take home.   Vin has completed his first full day of exercise.  We will continue to monitor his progression throughout the program.  Shaquon continues to tolerate exercise well. Staff will monitor progress.  Out since last review    Expected Outcomes  Short: Use RPE daily to regulate intensity.  Long: Follow program prescription in THR.  Short: Attend classes regularly.  Long: Continue to follow program prescription.   Short - Staff will review hpme exercise with Melo.  Long - he will add 1-2 of exercise at home  -       Discharge Exercise Prescription (Final Exercise Prescription Changes): Exercise Prescription Changes - 12/31/16 1500      Response to Exercise   Blood Pressure (Admit)  138/80    Blood Pressure (Exercise)  160/88    Blood Pressure (Exit)  122/72    Heart Rate (Admit)  97 bpm    Heart Rate (Exercise)  133 bpm    Heart Rate (Exit)  96 bpm    Rating of Perceived Exertion (Exercise)  12    Symptoms  none    Duration  Progress to 45 minutes of aerobic exercise without signs/symptoms of physical distress    Intensity  THRR unchanged      Progression   Progression  Continue to progress workloads to maintain intensity without signs/symptoms of physical distress.    Average METs  4.1      Resistance Training   Training Prescription  Yes    Weight  5 lb    Reps  10-15      Interval Training   Interval Training  No      Recumbant Bike   Level  8    RPM  60    Watts  86    Minutes  15    METs  4.06      REL-XR   Level  10    Speed  50    Minutes  15    METs  4.2       Nutrition:  Target Goals: Understanding of nutrition guidelines, daily intake of sodium <1537m, cholesterol <2072m calories 30% from fat and 7% or less from saturated fats, daily to have 5 or more servings of fruits  and vegetables.  Biometrics: Pre Biometrics - 12/11/16 1436      Pre Biometrics   Height  6' (1.829 m)    Weight  282 lb 4.8 oz (128.1 kg)    Waist Circumference  47.5 inches    Hip Circumference  50 inches    Waist to Hip Ratio  0.95 %    BMI (Calculated)  38.28    Single Leg Stand  30 seconds        Nutrition Therapy Plan and Nutrition Goals: Nutrition Therapy & Goals - 12/25/16 1356  Nutrition Therapy   RD appointment defered  Yes for now   for now      Nutrition Discharge: Rate Your Plate Scores: Nutrition Assessments - 12/11/16 1408      MEDFICTS Scores   Pre Score  42       Nutrition Goals Re-Evaluation:   Nutrition Goals Discharge (Final Nutrition Goals Re-Evaluation):   Psychosocial: Target Goals: Acknowledge presence or absence of significant depression and/or stress, maximize coping skills, provide positive support system. Participant is able to verbalize types and ability to use techniques and skills needed for reducing stress and depression.   Initial Review & Psychosocial Screening: Initial Psych Review & Screening - 12/11/16 1414      Initial Review   Current issues with  Current Depression;History of Depression;Current Anxiety/Panic;Current Psychotropic Meds;Current Sleep Concerns;Current Stress Concerns    Source of Stress Concerns  Poor Coping Skills;Financial;Chronic Illness;Unable to participate in former interests or hobbies    Comments  Tryce has a history of insomnia dating back to his days in the [employer]. Even with sleep medicine, he still usually sleeps only about 3 hours a night. For his depression, he was put on Zoloft and it was originally helping, but now he is starting to feel more down lately. He wants to see if this program helps, but is stressed about other issues in his life that he did not want to talk about at this time.       Family Dynamics   Good Support System?  Yes Girlfriend and Mom    Girlfriend and Mom      Screening  Interventions   Interventions  Yes;Encouraged to exercise;Program counselor consult    Expected Outcomes  Short Term goal: Utilizing psychosocial counselor, staff and physician to assist with identification of specific Stressors or current issues interfering with healing process. Setting desired goal for each stressor or current issue identified.;Long Term Goal: Stressors or current issues are controlled or eliminated.;Short Term goal: Identification and review with participant of any Quality of Life or Depression concerns found by scoring the questionnaire.;Long Term goal: The participant improves quality of Life and PHQ9 Scores as seen by post scores and/or verbalization of changes       Quality of Life Scores:  Quality of Life - 12/11/16 1420      Quality of Life Scores   Health/Function Pre  21 %    Socioeconomic Pre  19.71 %    Psych/Spiritual Pre  20.29 %    Family Pre  20.2 %    GLOBAL Pre  20.47 %       PHQ-9: Recent Review Flowsheet Data    Depression screen [facility] 2/9 12/11/2016 11/05/2016   Decreased Interest 2 0   Down, Depressed, Hopeless 3 0   PHQ - 2 Score 5 0   Altered sleeping 3 -   Tired, decreased energy 2 -   Change in appetite 0 -   Feeling bad or failure about yourself  3 -   Trouble concentrating 3 -   Moving slowly or fidgety/restless 0 -   Suicidal thoughts 1 -   PHQ-9 Score 17 -   Difficult doing work/chores Extremely dIfficult  -     Interpretation of Total Score  Total Score Depression Severity:  1-4 = Minimal depression, 5-9 = Mild depression, 10-14 = Moderate depression, 15-19 = Moderately severe depression, 20-27 = Severe depression   Psychosocial Evaluation and Intervention: Psychosocial Evaluation - 12/15/16 0947      Psychosocial Evaluation &  Interventions   Interventions  Encouraged to exercise with the program and follow exercise prescription;Relaxation education;Stress management education    Comments  Counselor met with Mr. Quinney  Grobe) today for initial psychosocial evaluation.  He is a 31 year old who is experiencing low-ejection fraction and some symptoms of Angina.  He has a limited support system with a mother and significant other he lives with.  He is an [place] who has experienced PTSD symptoms since then; with symptoms of problems sleeping; feeling bad about himself; and depressive symptoms with his PHQ-9 being a "17" indicating moderately severe depression. He is on medications for mood and for sleep reporting the Zoloft has been increased and is being helpful; but the sleep meds are "hit or miss" with an average of (3) hours per night of sleep.  He admits to multiple stressors currently with finances and concerns about losing his job if he doesn't return soon.  He has goals to increase his confidence in his ability to work out regularly and decrease his anxiety about his health issues and ability to work out.  Counselor encouraged Efton to consider a group for PTSD through the [place] to develop skills to cope better.  Counselor will be following with Ma Rings throughout the course of this program.     Expected Outcomes  Baldwin will benefit from consistent exercise to achieve his stated goals.  The educational and psychoeducational components will be helpful in understanding and learning to cope better with his health and mental health condition.  [facility]ino [facility] groups for PTSD and consider attending.  Counselor will follow.    Continue Psychosocial Services   Follow up required by counselor       Psychosocial Re-Evaluation: Psychosocial Re-Evaluation    Narka Name 12/29/16 1002             Psychosocial Re-Evaluation   Current issues with  Current Sleep Concerns;History of Depression;Current Anxiety/Panic;Current Psychotropic Meds;Current Stress Concerns       Comments  Counselor met with Mr. Corella Klipfel) today for follow up.  He reports not being consistent with class due  to he and his significant other are moving currently.  He reports the increased Zoloft has been helpful and he especially recognized this when he forgot to take it two days and became extremely irritable.  Counselor had educated Arvo on his personality seemingly being that of an introvert during our initial evaluation.  He stated this was very helpful in his relationship with his significant other as they researched this and he reports now she understands him better vs stating "something is wrong with him."  Vollie reports his mood has remained about the same.  He continues to struggle with sleep problems and states the [employer]psy [employer]ore is "NOT WORKING!"  Counselor encouraged Kellyn to mention this to the [place] again since he was diagnosed years ago with sleep apnea and never got the equipment to use to sleep at night.  Destan agreed to do this.  Counselor mentioned a natural OTC sustained release sleep aid to mention to his Dr. or pharmacist in the meantime.  Counselor will continue to follow with Jaqualin.         Expected Outcomes  Ala will become more consistent with exercise to help with his physiological and emotional/stress issues.  He will speak with his Dr. about an updated sleep study and the OTC natural aid PRN in the meantime.         Interventions  Stress management education;Relaxation education       Continue Psychosocial Services   Follow up required by counselor          Psychosocial Discharge (Final Psychosocial Re-Evaluation): Psychosocial Re-Evaluation - 12/29/16 1002      Psychosocial Re-Evaluation   Current issues with  Current Sleep Concerns;History of Depression;Current Anxiety/Panic;Current Psychotropic Meds;Current Stress Concerns    Comments  Counselor met with Mr. Weninger Dansereau) today for follow up.  He reports not being consistent with class due to he and his significant other are moving currently.  He reports the increased Zoloft has been helpful and he especially  recognized this when he forgot to take it two days and became extremely irritable.  Counselor had educated Nicanor on his personality seemingly being that of an introvert during our initial evaluation.  He stated this was very helpful in his relationship with his significant other as they researched this and he reports now she understands him better vs stating "something is wrong with him."  Avari reports his mood has remained about the same.  He continues to struggle with sleep problems and states the Gypsy Lore is "NOT WORKING!"  Counselor encouraged Markis to mention this to the [place] again since he was diagnosed years ago with sleep apnea and never got the equipment to use to sleep at night.  Broedy agreed to do this.  Counselor mentioned a natural OTC sustained release sleep aid to mention to his Dr. or pharmacist in the meantime.  Counselor will continue to follow with Anterio.      Expected Outcomes  Lennix will become more consistent with exercise to help with his physiological and emotional/stress issues.  He will speak with his Dr. about an updated sleep study and the OTC natural aid PRN in the meantime.      Interventions  Stress management education;Relaxation education    Continue Psychosocial Services   Follow up required by counselor       Vocational Rehabilitation: Provide vocational rehab assistance to qualifying candidates.   Vocational Rehab Evaluation & Intervention: Vocational Rehab - 12/11/16 1421      Initial Vocational Rehab Evaluation & Intervention   Assessment shows need for Vocational Rehabilitation  No       Education: Education Goals: Education classes will be provided on a variety of topics geared toward better understanding of heart health and risk factor modification. Participant will state understanding/return demonstration of topics presented as noted by education test scores.  Learning Barriers/Preferences: Learning Barriers/Preferences - 12/11/16 1420       Learning Barriers/Preferences   Learning Barriers  None    Learning Preferences  None       Education Topics: General Nutrition Guidelines/Fats and Fiber: -Group instruction provided by verbal, written material, models and posters to present the general guidelines for heart healthy nutrition. Gives an explanation and review of dietary fats and fiber.   Cardiac Rehab from 12/29/2016 in [facility] Cardiac and Pulmonary Rehab  Date  12/29/16  Educator  CR  Instruction Review Code  1- Verbalizes Understanding      Controlling Sodium/Reading Food Labels: -Group verbal and written material supporting the discussion of sodium use in heart healthy nutrition. Review and explanation with models, verbal and written materials for utilization of the food label.   Exercise Physiology & Risk Factors: - Group verbal and written instruction with models to review the exercise physiology of the cardiovascular system and associated critical values. Details cardiovascular disease risk factors and the goals associated with each  risk factor.   Aerobic Exercise & Resistance Training: - Gives group verbal and written discussion on the health impact of inactivity. On the components of aerobic and resistive training programs and the benefits of this training and how to safely progress through these programs.   Flexibility, Balance, General Exercise Guidelines: - Provides group verbal and written instruction on the benefits of flexibility and balance training programs. Provides general exercise guidelines with specific guidelines to those with heart or lung disease. Demonstration and skill practice provided.   Stress Management: - Provides group verbal and written instruction about the health risks of elevated stress, cause of high stress, and healthy ways to reduce stress.   Depression: - Provides group verbal and written instruction on the correlation between heart/lung disease and depressed mood, treatment  options, and the stigmas associated with seeking treatment.   Anatomy & Physiology of the Heart: - Group verbal and written instruction and models provide basic cardiac anatomy and physiology, with the coronary electrical and arterial systems. Review of: AMI, Angina, Valve disease, Heart Failure, Cardiac Arrhythmia, Pacemakers, and the ICD.   Cardiac Procedures: - Group verbal and written instruction to review commonly prescribed medications for heart disease. Reviews the medication, class of the drug, and side effects. Includes the steps to properly store meds and maintain the prescription regimen. (beta blockers and nitrates)   Cardiac Medications I: - Group verbal and written instruction to review commonly prescribed medications for heart disease. Reviews the medication, class of the drug, and side effects. Includes the steps to properly store meds and maintain the prescription regimen.   Cardiac Rehab from 12/29/2016 in [facility] Cardiac and Pulmonary Rehab  Date  12/15/16  Educator  [facility]  Instruction Review Code  1- Verbalizes Understanding      Cardiac Medications II: -Group verbal and written instruction to review commonly prescribed medications for heart disease. Reviews the medication, class of the drug, and side effects. (all other drug classes)    Go Sex-Intimacy & Heart Disease, Get SMART - Goal Setting: - Group verbal and written instruction through game format to discuss heart disease and the return to sexual intimacy. Provides group verbal and written material to discuss and apply goal setting through the application of the S.M.A.R.T. Method.   Other Matters of the Heart: - Provides group verbal, written materials and models to describe Heart Failure, Angina, Valve Disease, Peripheral Artery Disease, and Diabetes in the realm of heart disease. Includes description of the disease process and treatment options available to the cardiac patient.   Exercise & Equipment Safety: -  Individual verbal instruction and demonstration of equipment use and safety with use of the equipment.   Cardiac Rehab from 12/29/2016 in [facility] Cardiac and Pulmonary Rehab  Date  12/11/16  Educator  [place]  Instruction Review Code  1- Verbalizes Understanding      Infection Prevention: - Provides verbal and written material to individual with discussion of infection control including proper hand washing and proper equipment cleaning during exercise session.   Cardiac Rehab from 12/29/2016 in [facility] Cardiac and Pulmonary Rehab  Date  12/11/16  Educator  [place]  Instruction Review Code  1- Verbalizes Understanding      Falls Prevention: - Provides verbal and written material to individual with discussion of falls prevention and safety.   Cardiac Rehab from 12/29/2016 in [facility] Cardiac and Pulmonary Rehab  Date  12/11/16  Educator  [place]  Instruction Review Code  1- Verbalizes Understanding      Diabetes: -  Individual verbal and written instruction to review signs/symptoms of diabetes, desired ranges of glucose level fasting, after meals and with exercise. Acknowledge that pre and post exercise glucose checks will be done for 3 sessions at entry of program.   Other: -Provides group and verbal instruction on various topics (see comments)    Knowledge Questionnaire Score: Knowledge Questionnaire Score - 12/11/16 1420      Knowledge Questionnaire Score   Pre Score  22/28 Correct answers reviewed with Khalid   Correct answers reviewed with Ma Rings      Core Components/Risk Factors/Patient Goals at Admission: Personal Goals and Risk Factors at Admission - 12/11/16 1353      Core Components/Risk Factors/Patient Goals on Admission    Weight Management  Yes;Weight Loss;Obesity    Intervention  Weight Management: Develop a combined nutrition and exercise program designed to reach desired caloric intake, while maintaining appropriate intake of nutrient and fiber, sodium and fats, and appropriate  energy expenditure required for the weight goal.;Weight Management: Provide education and appropriate resources to help participant work on and attain dietary goals.;Weight Management/Obesity: Establish reasonable short term and long term weight goals.;Obesity: Provide education and appropriate resources to help participant work on and attain dietary goals.    Admit Weight  282 lb 4.8 oz (128.1 kg)    Goal Weight: Short Term  278 lb (126.1 kg)    Goal Weight: Long Term  240 lb (108.9 kg)    Expected Outcomes  Short Term: Continue to assess and modify interventions until short term weight is achieved;Long Term: Adherence to nutrition and physical activity/exercise program aimed toward attainment of established weight goal;Weight Loss: Understanding of general recommendations for a balanced deficit meal plan, which promotes 1-2 lb weight loss per week and includes a negative energy balance of [phone] kcal/d;Understanding recommendations for meals to include 15-35% energy as protein, 25-35% energy from fat, 35-60% energy from carbohydrates, less than 268m of dietary cholesterol, 20-35 gm of total fiber daily;Understanding of distribution of calorie intake throughout the day with the consumption of 4-5 meals/snacks    Heart Failure  Yes    Intervention  Provide a combined exercise and nutrition program that is supplemented with education, support and counseling about heart failure. Directed toward relieving symptoms such as shortness of breath, decreased exercise tolerance, and extremity edema.    Expected Outcomes  Improve functional capacity of life;Short term: Attendance in program 2-3 days a week with increased exercise capacity. Reported lower sodium intake. Reported increased fruit and vegetable intake. Reports medication compliance.;Short term: Daily weights obtained and reported for increase. Utilizing diuretic protocols set by physician.;Long term: Adoption of self-care skills and reduction of barriers  for early signs and symptoms recognition and intervention leading to self-care maintenance.    Stress  Yes He was surprised by his new heart event. He also stated having issues with poor coping skills, and financial issues.    He was surprised by his new heart event. He also stated having issues with poor coping skills, and financial issues.    Intervention  Offer individual and/or small group education and counseling on adjustment to heart disease, stress management and health-related lifestyle change. Teach and support self-help strategies.;Refer participants experiencing significant psychosocial distress to appropriate mental health specialists for further evaluation and treatment. When possible, include family members and significant others in education/counseling sessions.    Expected Outcomes  Short Term: Participant demonstrates changes in health-related behavior, relaxation and other stress management skills, ability to obtain effective social support, and  compliance with psychotropic medications if prescribed.;Long Term: Emotional wellbeing is indicated by absence of clinically significant psychosocial distress or social isolation.       Core Components/Risk Factors/Patient Goals Review:    Core Components/Risk Factors/Patient Goals at Discharge (Final Review):    ITP Comments: ITP Comments    Row Name 12/11/16 1347 12/24/16 0628 01/14/17 1541 01/21/17 0629     ITP Comments  Med Review Completed. Initial ITP created. Diagnosis can be found in Office visit 11/07/16  30 day Review. Continue with ITP unless directed changes per Medical Director Review.   Called to check on status of return.  Left voicemail.  According to chart, pt was in ED last week for atypical chest pain with no known cause and is to follow up as outpatient.   30 day review. Continue with ITP unless directed changes per Medical Director review.  Remains absent from program with medical concerns       Comments:

## 2017-01-26 ENCOUNTER — Encounter: Payer: Self-pay | Admitting: *Deleted

## 2017-01-26 ENCOUNTER — Telehealth: Payer: Self-pay | Admitting: *Deleted

## 2017-01-26 DIAGNOSIS — I214 Non-ST elevation (NSTEMI) myocardial infarction: Secondary | ICD-10-CM

## 2017-01-26 DIAGNOSIS — I5022 Chronic systolic (congestive) heart failure: Secondary | ICD-10-CM

## 2017-01-26 NOTE — Telephone Encounter (Signed)
Called to check on status of return.  He has been busy with his move and has not had gas money to get here. He will try to get here on Wednesday and we will give him a gas card to help.

## 2017-01-28 DIAGNOSIS — Z87891 Personal history of nicotine dependence: Secondary | ICD-10-CM | POA: Diagnosis not present

## 2017-01-28 DIAGNOSIS — I214 Non-ST elevation (NSTEMI) myocardial infarction: Secondary | ICD-10-CM | POA: Diagnosis not present

## 2017-01-28 DIAGNOSIS — I5022 Chronic systolic (congestive) heart failure: Secondary | ICD-10-CM

## 2017-01-28 DIAGNOSIS — F1990 Other psychoactive substance use, unspecified, uncomplicated: Secondary | ICD-10-CM | POA: Diagnosis not present

## 2017-01-28 DIAGNOSIS — Z7982 Long term (current) use of aspirin: Secondary | ICD-10-CM | POA: Diagnosis not present

## 2017-01-28 DIAGNOSIS — Z79899 Other long term (current) drug therapy: Secondary | ICD-10-CM | POA: Diagnosis not present

## 2017-01-28 NOTE — Progress Notes (Signed)
Daily Session Note  Patient Details  Name: Carlos Brewer. MRN: [id number] Date of Birth: 22-Dec-1985 Referring Provider:     Cardiac Rehab from 12/11/2016 in [facility] Cardiac and Pulmonary Rehab  Referring Provider  [place]      Encounter Date: 01/28/2017  Check In: Session Check In - 01/28/17 0826      Check-In   Location  [facility]-Cardiac & Pulmonary Rehab    Staff Present  Justin Mend RCP,RRT,BSRT;Krista Frederico [place]man, RN BSN;Jessica Luan Pulling, MA, ACSM RCEP, Exercise Physiologist    Supervising physician immediately available to respond to emergencies  See telemetry face sheet for immediately available ER MD    Medication changes reported      No    Fall or balance concerns reported     No    Warm-up and Cool-down  Performed on first and last piece of equipment    Resistance Training Performed  Yes    VAD Patient?  No      Pain Assessment   Currently in Pain?  No/denies          Social History   Tobacco Use  Smoking Status Former Smoker  . Packs/day: 0.25  . Types: Cigarettes  . Last attempt to quit: 10/23/2016  . Years since quitting: 0.2  Smokeless Tobacco Never Used    Goals Met:  Independence with exercise equipment Exercise tolerated well No report of cardiac concerns or symptoms Strength training completed today  Goals Unmet:  Not Applicable  Comments: Reviewed home exercise with pt today.  Pt plans to walk at home 2 days a week and play basketball 1 day a week for exercise.  Reviewed THR, pulse, RPE, sign and symptoms, NTG use, and when to call 911 or MD.  Also discussed weather considerations and indoor options.  Pt voiced understanding. Pt able to follow exercise prescription today without complaint.  Will continue to monitor for progression.   Dr. Emily Filbert is Medical Director for [facility].

## 2017-02-02 ENCOUNTER — Encounter: Payer: Commercial Managed Care - PPO | Admitting: *Deleted

## 2017-02-04 ENCOUNTER — Encounter: Payer: Self-pay | Admitting: Nurse Practitioner

## 2017-02-04 ENCOUNTER — Ambulatory Visit: Payer: Commercial Managed Care - PPO | Admitting: Nurse Practitioner

## 2017-02-07 NOTE — Progress Notes (Deleted)
Cardiology Office Note  Date:  02/07/2017   ID:  Carlos Brewer., DOB 04/23/1985, MRN [id number]  PCP:  Shearon Stalls, MD   No chief complaint on file.   HPI:  Carlos Brewer. is a 31 y.o. male  Hx of cocaine smoker Morbid obesity Cardiomyopathy, EF 15% CATH NONOBSTRUCTIVE DISEASE 10/27/16  D/c from hospital with lasix 40 BID Was previously on Lasix 20 twice a day prior to hospitalization Feels he is not eating enough Dry Weight at home 271 and 275 Cutting back on his meals out and fluid intake Tolerating current medications   went to all of his medications, denies any significant side effects Tested himself out, ran an obstacle course, had significant lightheadedness after short sprint Took him short time to recover  Mother involved in his care, presents with him today  EKG personally reviewed by myself on todays visit Normal sinus rhythm with rate 79 bpm no significant ST or T-wave changes  PMH:   has a past medical history of Cocaine abuse ([place]), HFrEF (heart failure with reduced ejection fraction) ([place]), History of cardiac cath, Morbid obesity ([place]), Myocarditis ([place]), NICM (nonischemic cardiomyopathy) ([place]), Palpitations, and Tobacco abuse.  PSH:    Past Surgical History:  Procedure Laterality Date  . CORONARY ANGIOPLASTY    . RIGHT/LEFT HEART CATH AND CORONARY ANGIOGRAPHY N/A 10/27/2016   Procedure: RIGHT/LEFT HEART CATH AND CORONARY ANGIOGRAPHY;  Surgeon: Wellington Hampshire, MD;  Location: [facility];  Service: Cardiovascular;  Laterality: N/A;  . WISDOM TOOTH EXTRACTION  2008    Current Outpatient Medications  Medication Sig Dispense Refill  . acetaminophen (TYLENOL) 500 MG tablet Take 2 tablets (1,000 mg total) by mouth every 6 (six) hours as needed. (Patient taking differently: Take 1,000 mg by mouth every 6 (six) hours as needed for mild pain or fever. ) 30 tablet 0  . aspirin EC 81 MG EC tablet Take 1 tablet (81 mg total) by mouth daily. 30  tablet 2  . atorvastatin (LIPITOR) 40 MG tablet Take 1 tablet (40 mg total) by mouth daily at 6 PM. 30 tablet 2  . carvedilol (COREG) 3.125 MG tablet Take 1 tablet (3.125 mg total) by mouth 2 (two) times daily with a meal. 60 tablet 2  . fluticasone (FLONASE) 50 MCG/ACT nasal spray Place 1 spray into both nostrils 2 (two) times daily. 16 g 0  . loratadine (CLARITIN) 10 MG tablet Take 1 tablet (10 mg total) by mouth daily. 30 tablet 1  . mupirocin ointment (BACTROBAN) 2 % Apply 1 application topically 2 (two) times daily. (Patient not taking: Reported on 12/11/2016) 22 g 0  . nitroGLYCERIN (NITROSTAT) 0.4 MG SL tablet Place 1 tablet (0.4 mg total) under the tongue every 5 (five) minutes as needed for chest pain. 30 tablet 2  . sacubitril-valsartan (ENTRESTO) 24-26 MG Take 1 tablet by mouth 2 (two) times daily. 60 tablet 3  . sertraline (ZOLOFT) 100 MG tablet TK 1 T PO QD  2  . sertraline (ZOLOFT) 25 MG tablet Take 1 tablet (25 mg total) by mouth daily. (Patient not taking: Reported on 12/11/2016) 30 tablet 0  . spironolactone (ALDACTONE) 25 MG tablet Take 25 mg by mouth daily.    Marland Kitchen torsemide (DEMADEX) 20 MG tablet Take 2 tablets (40 mg total) by mouth 2 (two) times daily. 120 tablet 7  . traZODone (DESYREL) 50 MG tablet Take 1 tablet (50 mg total) by mouth at bedtime. 30 tablet 0   No  current facility-administered medications for this visit.      Allergies:   Patient has no known allergies.   Social History:  The patient  reports that he quit smoking about 3 months ago. His smoking use included cigarettes. He smoked 0.25 packs per day. he has never used smokeless tobacco. He reports that he does not drink alcohol or use drugs.   Family History:   family history includes Healthy in his father and mother.    Review of Systems: Review of Systems  Constitutional: Negative.   Respiratory: Positive for shortness of breath.   Cardiovascular: Negative.   Gastrointestinal: Negative.    Musculoskeletal: Negative.   Neurological: Positive for dizziness.  Psychiatric/Behavioral: Negative.   All other systems reviewed and are negative.    PHYSICAL EXAM: VS:  There were no vitals taken for this visit. , BMI There is no height or weight on file to calculate BMI. GEN: Well nourished, well developed, in no acute distress , obese HEENT: normal  Neck: no JVD, carotid bruits, or masses Cardiac: RRR; no murmurs, rubs, or gallops,no edema  Respiratory:  clear to auscultation bilaterally, normal work of breathing GI: soft, nontender, nondistended, + BS MS: no deformity or atrophy  Skin: warm and dry, no rash Neuro:  Strength and sensation are intact Psych: euthymic mood, full affect    Recent Labs: 06/23/2016: TSH 1.247 10/26/2016: Magnesium 2.1 11/09/2016: B Natriuretic Peptide 151.0 01/09/2017: BUN 18; Creatinine, Ser 1.26; Hemoglobin 14.3; Platelets 286; Potassium 3.3; Sodium 142    Lipid Panel Lab Results  Component Value Date   CHOL 213 (H) 10/26/2016   HDL 62 10/26/2016   LDLCALC 130 (H) 10/26/2016   TRIG 106 10/26/2016      Wt Readings from Last 3 Encounters:  01/09/17 282 lb (127.9 kg)  12/11/16 282 lb 4.8 oz (128.1 kg)  11/09/16 280 lb (127 kg)       ASSESSMENT AND PLAN:  Chronic systolic heart failure ([place]) -  We have recommended he stop Lasix, start torsemide 40 twice a day Recommended he call us for any significant weight change By our notes weight is up 7 pounds since his hospital discharge He reports it has been stable  Substance use disorder - Plan: EKG 12-Lead, AMB referral to cardiac rehabilitation Strongly recommended he avoid cocaine and other drugs of abuse Discussed effect on the heart Also talked about alcohol   Nonischemic cardiomyopathy ([place]) We will repeat echo in follow-up visit 3 months Unable to advance his medications at this time given low blood pressure Stressed importance of watching his weight  Disposition:   F/U  3  months   Total encounter time more than 45 minutes  Greater than 50% was spent in counseling and coordination of care with the patient    No orders of the defined types were placed in this encounter.    Signed, Esmond Plants, M.D., Ph.D. 02/07/2017  [place], [place]

## 2017-02-09 ENCOUNTER — Ambulatory Visit: Payer: Commercial Managed Care - PPO | Admitting: Cardiovascular Disease

## 2017-02-10 ENCOUNTER — Telehealth: Payer: Self-pay | Admitting: *Deleted

## 2017-02-10 ENCOUNTER — Encounter: Payer: Self-pay | Admitting: *Deleted

## 2017-02-10 ENCOUNTER — Encounter: Payer: Self-pay | Admitting: Cardiovascular Disease

## 2017-02-10 DIAGNOSIS — I214 Non-ST elevation (NSTEMI) myocardial infarction: Secondary | ICD-10-CM

## 2017-02-10 DIAGNOSIS — I5022 Chronic systolic (congestive) heart failure: Secondary | ICD-10-CM

## 2017-02-10 NOTE — Telephone Encounter (Signed)
Called to check on status of return.  Voicemail box was full, unable to leave message

## 2017-02-16 ENCOUNTER — Encounter: Payer: Commercial Managed Care - PPO | Attending: Cardiovascular Disease

## 2017-02-16 DIAGNOSIS — F1990 Other psychoactive substance use, unspecified, uncomplicated: Secondary | ICD-10-CM | POA: Insufficient documentation

## 2017-02-16 DIAGNOSIS — Z87891 Personal history of nicotine dependence: Secondary | ICD-10-CM | POA: Insufficient documentation

## 2017-02-16 DIAGNOSIS — Z7982 Long term (current) use of aspirin: Secondary | ICD-10-CM | POA: Insufficient documentation

## 2017-02-16 DIAGNOSIS — I214 Non-ST elevation (NSTEMI) myocardial infarction: Secondary | ICD-10-CM | POA: Insufficient documentation

## 2017-02-16 DIAGNOSIS — Z79899 Other long term (current) drug therapy: Secondary | ICD-10-CM | POA: Insufficient documentation

## 2017-02-16 DIAGNOSIS — I5022 Chronic systolic (congestive) heart failure: Secondary | ICD-10-CM | POA: Insufficient documentation

## 2017-02-18 ENCOUNTER — Encounter: Payer: Self-pay | Admitting: *Deleted

## 2017-02-18 DIAGNOSIS — I214 Non-ST elevation (NSTEMI) myocardial infarction: Secondary | ICD-10-CM

## 2017-02-18 NOTE — Progress Notes (Signed)
Cardiac Individual Treatment Plan  Patient Details  Name: Webb Weed. MRN: [id number] Date of Birth: 05-Apr-1985 Referring Provider:     Cardiac Rehab from 12/11/2016 in [facility] Cardiac and Pulmonary Rehab  Referring Provider  Gollan      Initial Encounter Date:    Cardiac Rehab from 12/11/2016 in [facility] Cardiac and Pulmonary Rehab  Date  12/11/16  Referring Provider  Rockey Situ      Visit Diagnosis: NSTEMI (non-ST elevated myocardial infarction) [facility])  Patient's Home Medications on Admission:  Current Outpatient Medications:  .  acetaminophen (TYLENOL) 500 MG tablet, Take 2 tablets (1,000 mg total) by mouth every 6 (six) hours as needed. (Patient taking differently: Take 1,000 mg by mouth every 6 (six) hours as needed for mild pain or fever. ), Disp: 30 tablet, Rfl: 0 .  aspirin EC 81 MG EC tablet, Take 1 tablet (81 mg total) by mouth daily., Disp: 30 tablet, Rfl: 2 .  atorvastatin (LIPITOR) 40 MG tablet, Take 1 tablet (40 mg total) by mouth daily at 6 PM., Disp: 30 tablet, Rfl: 2 .  carvedilol (COREG) 3.125 MG tablet, Take 1 tablet (3.125 mg total) by mouth 2 (two) times daily with a meal., Disp: 60 tablet, Rfl: 2 .  fluticasone (FLONASE) 50 MCG/ACT nasal spray, Place 1 spray into both nostrils 2 (two) times daily., Disp: 16 g, Rfl: 0 .  loratadine (CLARITIN) 10 MG tablet, Take 1 tablet (10 mg total) by mouth daily., Disp: 30 tablet, Rfl: 1 .  mupirocin ointment (BACTROBAN) 2 %, Apply 1 application topically 2 (two) times daily. (Patient not taking: Reported on 12/11/2016), Disp: 22 g, Rfl: 0 .  nitroGLYCERIN (NITROSTAT) 0.4 MG SL tablet, Place 1 tablet (0.4 mg total) under the tongue every 5 (five) minutes as needed for chest pain., Disp: 30 tablet, Rfl: 2 .  sacubitril-valsartan (ENTRESTO) 24-26 MG, Take 1 tablet by mouth 2 (two) times daily., Disp: 60 tablet, Rfl: 3 .  sertraline (ZOLOFT) 100 MG tablet, TK 1 T PO QD, Disp: , Rfl: 2 .  sertraline (ZOLOFT) 25 MG tablet, Take 1 tablet  (25 mg total) by mouth daily. (Patient not taking: Reported on 12/11/2016), Disp: 30 tablet, Rfl: 0 .  spironolactone (ALDACTONE) 25 MG tablet, Take 25 mg by mouth daily., Disp: , Rfl:  .  torsemide (DEMADEX) 20 MG tablet, Take 2 tablets (40 mg total) by mouth 2 (two) times daily., Disp: 120 tablet, Rfl: 7 .  traZODone (DESYREL) 50 MG tablet, Take 1 tablet (50 mg total) by mouth at bedtime., Disp: 30 tablet, Rfl: 0  Past Medical History: Past Medical History:  Diagnosis Date  . Cocaine abuse ([place])   . HFrEF (heart failure with reduced ejection fraction) ([employer])    a. 10/2016 Echo: EF 15-20%, Gr2 DD, mildly dil LA/RA.  Marland Kitchen History of cardiac cath    a. 10/2016 Cath: LM nl, LAD min irregs, LCX nl, RCA nl, EF 15%.  . Morbid obesity ([place])   . Myocarditis ([place])    a. 10/2016 Admit w/ CHF and trop elevation; b. 10/2016 Echo: EF 15-20%; c. 10/2016 Cath: Min irregs in LAD otw nl cors, EF 15%, glob HK.  Marland Kitchen NICM (nonischemic cardiomyopathy) ([place])    a. 10/2016 Echo: EF 15-20%, Gr2 DD; b. 10/2016 Cath: Nl cors.  . Palpitations   . Tobacco abuse     Tobacco Use: Social History   Tobacco Use  Smoking Status Former Smoker  . Packs/day: 0.25  . Types: Cigarettes  . Last  attempt to quit: 10/23/2016  . Years since quitting: 0.3  Smokeless Tobacco Never Used    Labs: Recent Review [facility] Cardiac and Pulmonary Rehab Latest Ref Rng & Units 10/26/2016   Cholestrol 0 - 200 mg/dL 213(H)   LDLCALC 0 - 99 mg/dL 130(H)   HDL >40 mg/dL 62   Trlycerides <150 mg/dL 106       Exercise Target Goals:    Exercise Program Goal: Individual exercise prescription set with THRR, safety & activity barriers. Participant demonstrates ability to understand and report RPE using BORG scale, to self-measure pulse accurately, and to acknowledge the importance of the exercise prescription.  Exercise Prescription Goal: Starting with aerobic activity 30 plus minutes a day, 3 days per week for initial  exercise prescription. Provide home exercise prescription and guidelines that participant acknowledges understanding prior to discharge.  Activity Barriers & Risk Stratification: Activity Barriers & Cardiac Risk Stratification - 12/11/16 1424      Activity Barriers & Cardiac Risk Stratification   Activity Barriers  Chest Pain/Angina He does not like to take his Nitro because he does not like the side effects. He does take his Aspirin and says that tends to help if he were to have pain.     Cardiac Risk Stratification  High       6 Minute Walk: 6 Minute Walk    Row Name 12/11/16 1437         6 Minute Walk   Phase  Initial     Distance  1548 feet     Walk Time  6 minutes     # of Rest Breaks  0     MPH  2.93     METS  5.1     RPE  11     VO2 Peak  17.85     Symptoms  No     Resting HR  80 bpm     Resting BP  110/76     Resting Oxygen Saturation   98 %     Exercise Oxygen Saturation  during 6 min walk  98 %     Max Ex. HR  115 bpm     Max Ex. BP  154/92     2 Minute Post BP  116/74        Oxygen Initial Assessment:   Oxygen Re-Evaluation:   Oxygen Discharge (Final Oxygen Re-Evaluation):   Initial Exercise Prescription: Initial Exercise Prescription - 12/11/16 1400      Date of Initial Exercise RX and Referring Provider   Date  12/11/16    Referring Provider  Gollan      Treadmill   MPH  3    Grade  4    Minutes  15    METs  4.95      Recumbant Bike   Level  8    RPM  60    Watts  120    Minutes  15    METs  5      Elliptical   Level  1    Speed  3    Minutes  15    METs  5      REL-XR   Level  10    Speed  50    Minutes  15    METs  5      Prescription Details   Frequency (times per week)  3    Duration  Progress to 45 minutes  of aerobic exercise without signs/symptoms of physical distress      Intensity   THRR 40-80% of Max Heartrate  123-166    Ratings of Perceived Exertion  11-15    Perceived Dyspnea  0-4      Resistance Training     Training Prescription  Yes    Weight  5 lb.    Reps  10-15       Perform Capillary Blood Glucose checks as needed.  Exercise Prescription Changes: Exercise Prescription Changes    Row Name 12/11/16 1400 12/18/16 1300 12/31/16 1500 01/28/17 0800 02/12/17 1300     Response to Exercise   Blood Pressure (Admit)  110/76  126/78  138/80  -  148/96   Blood Pressure (Exercise)  154/94  154/80  160/88  -  150/92   Blood Pressure (Exit)  116/74  114/60  122/72  -  130/82   Heart Rate (Admit)  80 bpm  83 bpm  97 bpm  -  88 bpm   Heart Rate (Exercise)  115 bpm  131 bpm  133 bpm  -  140 bpm   Heart Rate (Exit)  77 bpm  98 bpm  96 bpm  -  85 bpm   Oxygen Saturation (Admit)  98 %  -  -  -  -   Oxygen Saturation (Exercise)  98 %  -  -  -  -   Rating of Perceived Exertion (Exercise)  _0 -  12   Symptoms  -  none  none  -  none   Comments  -  first full day of exercise  -  -  -   Duration  -  Progress to 45 minutes of aerobic exercise without signs/symptoms of physical distress  Progress to 45 minutes of aerobic exercise without signs/symptoms of physical distress  -  Progress to 45 minutes of aerobic exercise without signs/symptoms of physical distress   Intensity  -  THRR unchanged  THRR unchanged  -  THRR unchanged     Progression   Progression  -  Continue to progress workloads to maintain intensity without signs/symptoms of physical distress.  Continue to progress workloads to maintain intensity without signs/symptoms of physical distress.  -  Continue to progress workloads to maintain intensity without signs/symptoms of physical distress.   Average METs  -  4.5  4.1  -  3.1     Resistance Training   Training Prescription  -  Yes  Yes  -  Yes   Weight  -  5 lb.  5 lb  -  5 lb   Reps  -  10-15  10-15  -  10-15     Interval Training   Interval Training  -  No  No  -  No     Treadmill   MPH  -  3  -  -  -   Grade  -  4  -  -  -   Minutes  -  15  -  -  -   METs  -  4.95  -  -  -      Recumbant Bike   Level  -  -  8  -  -   RPM  -  -  60  -  -   Watts  -  -  86  -  -   Minutes  -  -  15  -  -  METs  -  -  4.06  -  -     Elliptical   Level  -  -  -  -  1   Speed  -  -  -  -  3.5   Minutes  -  -  -  -  15     REL-XR   Level  -  8  10  -  10   Speed  -  -  50  -  -   Minutes  -  15  15  -  15   METs  -  4.1  4.2  -  3.1     Home Exercise Plan   Plans to continue exercise at  -  -  -  Home (comment) plans to walk 2 days a week and play basketball 1  Home (comment) plans to walk 2 days a week and play basketball 1   Frequency  -  -  -  Add 2 additional days to program exercise sessions.  Add 2 additional days to program exercise sessions.   Initial Home Exercises Provided  -  -  -  01/28/17  01/28/17      Exercise Comments: Exercise Comments    Row Name 12/15/16 0755           Exercise Comments  First full day of exercise!  Patient was oriented to gym and equipment including functions, settings, policies, and procedures.  Patient's individual exercise prescription and treatment plan were reviewed.  All starting workloads were established based on the results of the 6 minute walk test done at initial orientation visit.  The plan for exercise progression was also introduced and progression will be customized based on patient's performance and goals.          Exercise Goals and Review: Exercise Goals    Row Name 12/11/16 1439             Exercise Goals   Increase Physical Activity  Yes       Intervention  Provide advice, education, support and counseling about physical activity/exercise needs.;Develop an individualized exercise prescription for aerobic and resistive training based on initial evaluation findings, risk stratification, comorbidities and participant's personal goals.       Expected Outcomes  Achievement of increased cardiorespiratory fitness and enhanced flexibility, muscular endurance and strength shown through measurements of functional  capacity and personal statement of participant.       Increase Strength and Stamina  Yes       Intervention  Provide advice, education, support and counseling about physical activity/exercise needs.;Develop an individualized exercise prescription for aerobic and resistive training based on initial evaluation findings, risk stratification, comorbidities and participant's personal goals.       Expected Outcomes  Achievement of increased cardiorespiratory fitness and enhanced flexibility, muscular endurance and strength shown through measurements of functional capacity and personal statement of participant.       Able to understand and use rate of perceived exertion (RPE) scale  Yes       Intervention  Provide education and explanation on how to use RPE scale       Expected Outcomes  Short Term: Able to use RPE daily in rehab to express subjective intensity level;Long Term:  Able to use RPE to guide intensity level when exercising independently       Able to understand and use Dyspnea scale  Yes       Intervention  Provide education and explanation  on how to use Dyspnea scale       Expected Outcomes  Short Term: Able to use Dyspnea scale daily in rehab to express subjective sense of shortness of breath during exertion;Long Term: Able to use Dyspnea scale to guide intensity level when exercising independently       Knowledge and understanding of Target Heart Rate Range (THRR)  Yes       Intervention  Provide education and explanation of THRR including how the numbers were predicted and where they are located for reference       Expected Outcomes  Short Term: Able to state/look up THRR;Long Term: Able to use THRR to govern intensity when exercising independently;Short Term: Able to use daily as guideline for intensity in rehab       Able to check pulse independently  Yes       Intervention  Provide education and demonstration on how to check pulse in carotid and radial arteries.;Review the importance of  being able to check your own pulse for safety during independent exercise       Expected Outcomes  Short Term: Able to explain why pulse checking is important during independent exercise;Long Term: Able to check pulse independently and accurately       Understanding of Exercise Prescription  Yes       Intervention  Provide education, explanation, and written materials on patient's individual exercise prescription       Expected Outcomes  Short Term: Able to explain program exercise prescription;Long Term: Able to explain home exercise prescription to exercise independently          Exercise Goals Re-Evaluation : Exercise Goals Re-Evaluation    Row Name 12/15/16 0754 12/18/16 1342 12/31/16 1547 01/14/17 1542 01/26/17 1307     Exercise Goal Re-Evaluation   Exercise Goals Review  Understanding of Exercise Prescription;Knowledge and understanding of Target Heart Rate Range (THRR);Able to understand and use rate of perceived exertion (RPE) scale  Increase Physical Activity;Increase Strength and Stamina  Increase Physical Activity;Increase Strength and Stamina  -  -   Comments  Reviewed RPE scale, THR and program prescription with pt today.  Pt voiced understanding and was given a copy of goals to take home.   Ova has completed his first full day of exercise.  We will continue to monitor his progression throughout the program.  [place] continues to tolerate exercise well. Staff will monitor progress.  Out since last review  Out since last review, hopes to return on Wednesday   Expected Outcomes  Short: Use RPE daily to regulate intensity.  Long: Follow program prescription in THR.  Short: Attend classes regularly.  Long: Continue to follow program prescription.   Short - Staff will review hpme exercise with Reilley.  Long - he will add 1-2 of exercise at home  -  -   Row Name 01/28/17 0850 02/12/17 1334           Exercise Goal Re-Evaluation   Exercise Goals Review  Increase Physical  Activity;Increase Strength and Stamina  Increase Physical Activity;Increase Strength and Stamina      Comments  Reviewed home exercise with pt today.  Pt plans to walk at home 2 days a week and play basketball 1 day a week for exercise.  Reviewed THR, pulse, RPE, sign and symptoms, NTG use, and when to call 911 or MD.  Also discussed weather considerations and indoor options.  Pt voiced understanding.  Lane has only attended once since last reivew.  He  is now up to level 10 on the XR and using 5 lb weights.  We will continue to monitor his progression.       Expected Outcomes  Short: walk 2 days a week at home and play basketball 1 day a week. Long: add and manitain home exercise routine.  Short: Return to rehab regularly.  Long: Exercise routinely.         Discharge Exercise Prescription (Final Exercise Prescription Changes): Exercise Prescription Changes - 02/12/17 1300      Response to Exercise   Blood Pressure (Admit)  148/96    Blood Pressure (Exercise)  150/92    Blood Pressure (Exit)  130/82    Heart Rate (Admit)  88 bpm    Heart Rate (Exercise)  140 bpm    Heart Rate (Exit)  85 bpm    Rating of Perceived Exertion (Exercise)  12    Symptoms  none    Duration  Progress to 45 minutes of aerobic exercise without signs/symptoms of physical distress    Intensity  THRR unchanged      Progression   Progression  Continue to progress workloads to maintain intensity without signs/symptoms of physical distress.    Average METs  3.1      Resistance Training   Training Prescription  Yes    Weight  5 lb    Reps  10-15      Interval Training   Interval Training  No      Elliptical   Level  1    Speed  3.5    Minutes  15      REL-XR   Level  10    Minutes  15    METs  3.1      Home Exercise Plan   Plans to continue exercise at  Home (comment) plans to walk 2 days a week and play basketball 1    Frequency  Add 2 additional days to program exercise sessions.    Initial Home  Exercises Provided  01/28/17       Nutrition:  Target Goals: Understanding of nutrition guidelines, daily intake of sodium <1548m, cholesterol <2041m calories 30% from fat and 7% or less from saturated fats, daily to have 5 or more servings of fruits and vegetables.  Biometrics: Pre Biometrics - 12/11/16 1436      Pre Biometrics   Height  6' (1.829 m)    Weight  282 lb 4.8 oz (128.1 kg)    Waist Circumference  47.5 inches    Hip Circumference  50 inches    Waist to Hip Ratio  0.95 %    BMI (Calculated)  38.28    Single Leg Stand  30 seconds        Nutrition Therapy Plan and Nutrition Goals: Nutrition Therapy & Goals - 12/25/16 1356      Nutrition Therapy   RD appointment defered  Yes for now       Nutrition Discharge: Rate Your Plate Scores: Nutrition Assessments - 12/11/16 1408      MEDFICTS Scores   Pre Score  42       Nutrition Goals Re-Evaluation:   Nutrition Goals Discharge (Final Nutrition Goals Re-Evaluation):   Psychosocial: Target Goals: Acknowledge presence or absence of significant depression and/or stress, maximize coping skills, provide positive support system. Participant is able to verbalize types and ability to use techniques and skills needed for reducing stress and depression.   Initial Review & Psychosocial Screening: Initial Psych Review &  Screening - 12/11/16 1414      Initial Review   Current issues with  Current Depression;History of Depression;Current Anxiety/Panic;Current Psychotropic Meds;Current Sleep Concerns;Current Stress Concerns    Source of Stress Concerns  Poor Coping Skills;Financial;Chronic Illness;Unable to participate in former interests or hobbies    Comments  Jakobie has a history of insomnia dating back to his days in the [employer]. Even with sleep medicine, he still usually sleeps only about 3 hours a night. For his depression, he was put on Zoloft and it was originally helping, but now he is starting to feel more down  lately. He wants to see if this program helps, but is stressed about other issues in his life that he did not want to talk about at this time.       Family Dynamics   Good Support System?  Yes Girlfriend and Mom       Screening Interventions   Interventions  Yes;Encouraged to exercise;Program counselor consult    Expected Outcomes  Short Term goal: Utilizing psychosocial counselor, staff and physician to assist with identification of specific Stressors or current issues interfering with healing process. Setting desired goal for each stressor or current issue identified.;Long Term Goal: Stressors or current issues are controlled or eliminated.;Short Term goal: Identification and review with participant of any Quality of Life or Depression concerns found by scoring the questionnaire.;Long Term goal: The participant improves quality of Life and PHQ9 Scores as seen by post scores and/or verbalization of changes       Quality of Life Scores:  Quality of Life - 12/11/16 1420      Quality of Life Scores   Health/Function Pre  21 %    Socioeconomic Pre  19.71 %    Psych/Spiritual Pre  20.29 %    Family Pre  20.2 %    GLOBAL Pre  20.47 %       PHQ-9: Recent Review Flowsheet Data    Depression screen [facility] 2/9 12/11/2016 11/05/2016   Decreased Interest 2 0   Down, Depressed, Hopeless 3 0   PHQ - 2 Score 5 0   Altered sleeping 3 -   Tired, decreased energy 2 -   Change in appetite 0 -   Feeling bad or failure about yourself  3 -   Trouble concentrating 3 -   Moving slowly or fidgety/restless 0 -   Suicidal thoughts 1 -   PHQ-9 Score 17 -   Difficult doing work/chores Extremely dIfficult  -     Interpretation of Total Score  Total Score Depression Severity:  1-4 = Minimal depression, 5-9 = Mild depression, 10-14 = Moderate depression, 15-19 = Moderately severe depression, 20-27 = Severe depression   Psychosocial Evaluation and Intervention: Psychosocial Evaluation - 12/15/16 0947       Psychosocial Evaluation & Interventions   Interventions  Encouraged to exercise with the program and follow exercise prescription;Relaxation education;Stress management education    Comments  Counselor met with Mr. Lia Ma Rings) today for initial psychosocial evaluation.  He is a 31 year old who is experiencing low-ejection fraction and some symptoms of Angina.  He has a limited support system with a mother and significant other he lives with.  He is an [place] who has experienced PTSD symptoms since then; with symptoms of problems sleeping; feeling bad about himself; and depressive symptoms with his PHQ-9 being a "17" indicating moderately severe depression. He is on medications for mood and for sleep reporting the Zoloft has been increased  and is being helpful; but the sleep meds are "hit or miss" with an average of (3) hours per night of sleep.  He admits to multiple stressors currently with finances and concerns about losing his job if he doesn't return soon.  He has goals to increase his confidence in his ability to work out regularly and decrease his anxiety about his health issues and ability to work out.  Counselor encouraged Tyris to consider a group for PTSD through the [facility] to develop skills to cope better.  Counselor will be following with Ma Rings throughout the course of this program.     Expected Outcomes  Torence will benefit from consistent exercise to achieve his stated goals.  The educational and psychoeducational components will be helpful in understanding and learning to cope better with his health and mental health condition.  [facility] for [facility] and consider attending.  Counselor will follow.    Continue Psychosocial Services   Follow up required by counselor       Psychosocial Re-Evaluation: Psychosocial Re-Evaluation    [place] Name 12/29/16 1002 01/26/17 1306           Psychosocial Re-Evaluation   Current issues with  Current  Sleep Concerns;History of Depression;Current Anxiety/Panic;Current Psychotropic Meds;Current Stress Concerns  Current Stress Concerns      Comments  Counselor met with Mr. Schueller Drone) today for follow up.  He reports not being consistent with class due to he and his significant other are moving currently.  He reports the increased Zoloft has been helpful and he especially recognized this when he forgot to take it two days and became extremely irritable.  Counselor had educated Jehiel on his personality seemingly being that of an introvert during our initial evaluation.  He stated this was very helpful in his relationship with his significant other as they researched this and he reports now she understands him better vs stating "something is wrong with him."  Elazar reports his mood has remained about the same.  He continues to struggle with sleep problems and states the [employer] is "NOT WORKING!"  Counselor encouraged Keilan to mention this to the [place] again since he was diagnosed years ago with sleep apnea and never got the equipment to use to sleep at night.  Javares agreed to do this.  Counselor mentioned a natural OTC sustained release sleep aid to mention to his Dr. or pharmacist in the meantime.  Counselor will continue to follow with Bralyn.    Called to check on status of return.  He has been busy with his move and has not had gas money to get here. He will try to get here on Wednesday and we will give him a gas card to help.       Expected Outcomes  Cote will become more consistent with exercise to help with his physiological and emotional/stress issues.  He will speak with his Dr. about an updated sleep study and the OTC natural aid PRN in the meantime.    Short: Return to rehab and use gas card.  Long: Attend regularly and given gas card every 3 visits      Interventions  Stress management education;Relaxation education  -      Continue Psychosocial Services   Follow up required by  counselor  Follow up required by counselor        Initial Review   Source of Stress Concerns  -  Transportation  Psychosocial Discharge (Final Psychosocial Re-Evaluation): Psychosocial Re-Evaluation - 01/26/17 1306      Psychosocial Re-Evaluation   Current issues with  Current Stress Concerns    Comments  Called to check on status of return.  He has been busy with his move and has not had gas money to get here. He will try to get here on Wednesday and we will give him a gas card to help.     Expected Outcomes  Short: Return to rehab and use gas card.  Long: Attend regularly and given gas card every 3 visits    Continue Psychosocial Services   Follow up required by counselor      Initial Review   Source of Stress Concerns  Transportation       Vocational Rehabilitation: Provide vocational rehab assistance to qualifying candidates.   Vocational Rehab Evaluation & Intervention: Vocational Rehab - 12/11/16 1421      Initial Vocational Rehab Evaluation & Intervention   Assessment shows need for Vocational Rehabilitation  No       Education: Education Goals: Education classes will be provided on a variety of topics geared toward better understanding of heart health and risk factor modification. Participant will state understanding/return demonstration of topics presented as noted by education test scores.  Learning Barriers/Preferences: Learning Barriers/Preferences - 12/11/16 1420      Learning Barriers/Preferences   Learning Barriers  None    Learning Preferences  None       Education Topics: General Nutrition Guidelines/Fats and Fiber: -Group instruction provided by verbal, written material, models and posters to present the general guidelines for heart healthy nutrition. Gives an explanation and review of dietary fats and fiber.   Cardiac Rehab from 01/28/2017 in [facility] Cardiac and Pulmonary Rehab  Date  12/29/16  Educator  CR  Instruction Review Code  1-  Verbalizes Understanding      Controlling Sodium/Reading Food Labels: -Group verbal and written material supporting the discussion of sodium use in heart healthy nutrition. Review and explanation with models, verbal and written materials for utilization of the food label.   Exercise Physiology & Risk Factors: - Group verbal and written instruction with models to review the exercise physiology of the cardiovascular system and associated critical values. Details cardiovascular disease risk factors and the goals associated with each risk factor.   Aerobic Exercise & Resistance Training: - Gives group verbal and written discussion on the health impact of inactivity. On the components of aerobic and resistive training programs and the benefits of this training and how to safely progress through these programs.   Flexibility, Balance, General Exercise Guidelines: - Provides group verbal and written instruction on the benefits of flexibility and balance training programs. Provides general exercise guidelines with specific guidelines to those with heart or lung disease. Demonstration and skill practice provided.   Stress Management: - Provides group verbal and written instruction about the health risks of elevated stress, cause of high stress, and healthy ways to reduce stress.   Cardiac Rehab from 01/28/2017 in [facility] Cardiac and Pulmonary Rehab  Date  01/28/17  Educator  [facility]  Instruction Review Code  1- Verbalizes Understanding      Depression: - Provides group verbal and written instruction on the correlation between heart/lung disease and depressed mood, treatment options, and the stigmas associated with seeking treatment.   Anatomy & Physiology of the Heart: - Group verbal and written instruction and models provide basic cardiac anatomy and physiology, with the coronary electrical and arterial systems. Review  of: AMI, Angina, Valve disease, Heart Failure, Cardiac Arrhythmia, Pacemakers,  and the ICD.   Cardiac Procedures: - Group verbal and written instruction to review commonly prescribed medications for heart disease. Reviews the medication, class of the drug, and side effects. Includes the steps to properly store meds and maintain the prescription regimen. (beta blockers and nitrates)   Cardiac Medications I: - Group verbal and written instruction to review commonly prescribed medications for heart disease. Reviews the medication, class of the drug, and side effects. Includes the steps to properly store meds and maintain the prescription regimen.   Cardiac Rehab from 01/28/2017 in [facility] Cardiac and Pulmonary Rehab  Date  12/15/16  Educator  [facility]  Instruction Review Code  1- Verbalizes Understanding      Cardiac Medications II: -Group verbal and written instruction to review commonly prescribed medications for heart disease. Reviews the medication, class of the drug, and side effects. (all other drug classes)    Go Sex-Intimacy & Heart Disease, Get SMART - Goal Setting: - Group verbal and written instruction through game format to discuss heart disease and the return to sexual intimacy. Provides group verbal and written material to discuss and apply goal setting through the application of the S.M.A.R.T. Method.   Other Matters of the Heart: - Provides group verbal, written materials and models to describe Heart Failure, Angina, Valve Disease, Peripheral Artery Disease, and Diabetes in the realm of heart disease. Includes description of the disease process and treatment options available to the cardiac patient.   Exercise & Equipment Safety: - Individual verbal instruction and demonstration of equipment use and safety with use of the equipment.   Cardiac Rehab from 01/28/2017 in [facility] Cardiac and Pulmonary Rehab  Date  12/11/16  Educator  [place]  Instruction Review Code  1- Verbalizes Understanding      Infection Prevention: - Provides verbal and written material to  individual with discussion of infection control including proper hand washing and proper equipment cleaning during exercise session.   Cardiac Rehab from 01/28/2017 in [facility] Cardiac and Pulmonary Rehab  Date  12/11/16  Educator  [place]  Instruction Review Code  1- Verbalizes Understanding      Falls Prevention: - Provides verbal and written material to individual with discussion of falls prevention and safety.   Cardiac Rehab from 01/28/2017 in [facility] Cardiac and Pulmonary Rehab  Date  12/11/16  Educator  [place]  Instruction Review Code  1- Verbalizes Understanding      Diabetes: - Individual verbal and written instruction to review signs/symptoms of diabetes, desired ranges of glucose level fasting, after meals and with exercise. Acknowledge that pre and post exercise glucose checks will be done for 3 sessions at entry of program.   Other: -Provides group and verbal instruction on various topics (see comments)    Knowledge Questionnaire Score: Knowledge Questionnaire Score - 12/11/16 1420      Knowledge Questionnaire Score   Pre Score  22/28 Correct answers reviewed with Domique       Core Components/Risk Factors/Patient Goals at Admission: Personal Goals and Risk Factors at Admission - 12/11/16 1353      Core Components/Risk Factors/Patient Goals on Admission    Weight Management  Yes;Weight Loss;Obesity    Intervention  Weight Management: Develop a combined nutrition and exercise program designed to reach desired caloric intake, while maintaining appropriate intake of nutrient and fiber, sodium and fats, and appropriate energy expenditure required for the weight goal.;Weight Management: Provide education and appropriate resources to help participant  work on and attain dietary goals.;Weight Management/Obesity: Establish reasonable short term and long term weight goals.;Obesity: Provide education and appropriate resources to help participant work on and attain dietary goals.    Admit  Weight  282 lb 4.8 oz (128.1 kg)    Goal Weight: Short Term  278 lb (126.1 kg)    Goal Weight: Long Term  240 lb (108.9 kg)    Expected Outcomes  Short Term: Continue to assess and modify interventions until short term weight is achieved;Long Term: Adherence to nutrition and physical activity/exercise program aimed toward attainment of established weight goal;Weight Loss: Understanding of general recommendations for a balanced deficit meal plan, which promotes 1-2 lb weight loss per week and includes a negative energy balance of [phone] kcal/d;Understanding recommendations for meals to include 15-35% energy as protein, 25-35% energy from fat, 35-60% energy from carbohydrates, less than 267m of dietary cholesterol, 20-35 gm of total fiber daily;Understanding of distribution of calorie intake throughout the day with the consumption of 4-5 meals/snacks    Heart Failure  Yes    Intervention  Provide a combined exercise and nutrition program that is supplemented with education, support and counseling about heart failure. Directed toward relieving symptoms such as shortness of breath, decreased exercise tolerance, and extremity edema.    Expected Outcomes  Improve functional capacity of life;Short term: Attendance in program 2-3 days a week with increased exercise capacity. Reported lower sodium intake. Reported increased fruit and vegetable intake. Reports medication compliance.;Short term: Daily weights obtained and reported for increase. Utilizing diuretic protocols set by physician.;Long term: Adoption of self-care skills and reduction of barriers for early signs and symptoms recognition and intervention leading to self-care maintenance.    Stress  Yes He was surprised by his new heart event. He also stated having issues with poor coping skills, and financial issues.     Intervention  Offer individual and/or small group education and counseling on adjustment to heart disease, stress management and  health-related lifestyle change. Teach and support self-help strategies.;Refer participants experiencing significant psychosocial distress to appropriate mental health specialists for further evaluation and treatment. When possible, include family members and significant others in education/counseling sessions.    Expected Outcomes  Short Term: Participant demonstrates changes in health-related behavior, relaxation and other stress management skills, ability to obtain effective social support, and compliance with psychotropic medications if prescribed.;Long Term: Emotional wellbeing is indicated by absence of clinically significant psychosocial distress or social isolation.       Core Components/Risk Factors/Patient Goals Review:  Goals and Risk Factor Review    Row Name 02/12/17 1339             Core Components/Risk Factors/Patient Goals Review   Review  Only attended once since last review.           Core Components/Risk Factors/Patient Goals at Discharge (Final Review):  Goals and Risk Factor Review - 02/12/17 1339      Core Components/Risk Factors/Patient Goals Review   Review  Only attended once since last review.        ITP Comments: ITP Comments    Row Name 12/11/16 1347 12/24/16 0628 01/14/17 1541 01/21/17 0629 01/26/17 1306   ITP Comments  Med Review Completed. Initial ITP created. Diagnosis can be found in Office visit 11/07/16  30 day Review. Continue with ITP unless directed changes per Medical Director Review.   Called to check on status of return.  Left voicemail.  According to chart, pt was in ED last week for  atypical chest pain with no known cause and is to follow up as outpatient.   30 day review. Continue with ITP unless directed changes per Medical Director review.  Remains absent from program with medical concerns  Called to check on status of return.  He has been busy with his move and has not had gas money to get here. He will try to get here on Wednesday and we  will give him a gas card to help.    [place] Name 02/10/17 1330 02/18/17 0555         ITP Comments  Called to check on status of return.  Voicemail box was full, unable to leave message  30 day review. Continue with ITP unless directed changes per Medical Director review.          Comments:

## 2017-02-24 ENCOUNTER — Encounter: Payer: Self-pay | Admitting: *Deleted

## 2017-02-24 DIAGNOSIS — I214 Non-ST elevation (NSTEMI) myocardial infarction: Secondary | ICD-10-CM

## 2017-02-24 DIAGNOSIS — I5022 Chronic systolic (congestive) heart failure: Secondary | ICD-10-CM

## 2017-02-24 NOTE — Progress Notes (Signed)
Discharge Progress Report  Patient Details  Name: Carlos Brewer. MRN: [id number] Date of Birth: 1985-10-12 Referring Provider:     Cardiac Rehab from 12/11/2016 in [facility] Cardiac and Pulmonary Rehab  Referring Provider  [place]       Number of Visits: 7/36  Reason for Discharge:  Early Exit:  Lack of attendance  Smoking History:  Social History   Tobacco Use  Smoking Status Former Smoker  . Packs/day: 0.25  . Types: Cigarettes  . Last attempt to quit: 10/23/2016  . Years since quitting: 0.3  Smokeless Tobacco Never Used    Diagnosis:  NSTEMI (non-ST elevated myocardial infarction) ([place])  Heart failure, chronic systolic ([place])  ADL UCSD:   Initial Exercise Prescription: Initial Exercise Prescription - 12/11/16 1400      Date of Initial Exercise RX and Referring Provider   Date  12/11/16    Referring Provider  [place]      Treadmill   MPH  3    Grade  4    Minutes  15    METs  4.95      Recumbant Bike   Level  8    RPM  60    Watts  120    Minutes  15    METs  5      Elliptical   Level  1    Speed  3    Minutes  15    METs  5      REL-XR   Level  10    Speed  50    Minutes  15    METs  5      Prescription Details   Frequency (times per week)  3    Duration  Progress to 45 minutes of aerobic exercise without signs/symptoms of physical distress      Intensity   THRR 40-80% of Max Heartrate  123-166    Ratings of Perceived Exertion  11-15    Perceived Dyspnea  0-4      Resistance Training   Training Prescription  Yes    Weight  5 lb.    Reps  10-15       Discharge Exercise Prescription (Final Exercise Prescription Changes): Exercise Prescription Changes - 02/12/17 1300      Response to Exercise   Blood Pressure (Admit)  148/96    Blood Pressure (Exercise)  150/92    Blood Pressure (Exit)  130/82    Heart Rate (Admit)  88 bpm    Heart Rate (Exercise)  140 bpm    Heart Rate (Exit)  85 bpm    Rating of Perceived Exertion (Exercise)   12    Symptoms  none    Duration  Progress to 45 minutes of aerobic exercise without signs/symptoms of physical distress    Intensity  THRR unchanged      Progression   Progression  Continue to progress workloads to maintain intensity without signs/symptoms of physical distress.    Average METs  3.1      Resistance Training   Training Prescription  Yes    Weight  5 lb    Reps  10-15      Interval Training   Interval Training  No      Elliptical   Level  1    Speed  3.5    Minutes  15      REL-XR   Level  10    Minutes  15    METs  3.1      Home Exercise Plan   Plans to continue exercise at  Home (comment) plans to walk 2 days a week and play basketball 1    Frequency  Add 2 additional days to program exercise sessions.    Initial Home Exercises Provided  01/28/17       Functional Capacity: 6 Minute Walk    Row Name 12/11/16 1437         6 Minute Walk   Phase  Initial     Distance  1548 feet     Walk Time  6 minutes     # of Rest Breaks  0     MPH  2.93     METS  5.1     RPE  11     VO2 Peak  17.85     Symptoms  No     Resting HR  80 bpm     Resting BP  110/76     Resting Oxygen Saturation   98 %     Exercise Oxygen Saturation  during 6 min walk  98 %     Max Ex. HR  115 bpm     Max Ex. BP  154/92     2 Minute Post BP  116/74        Psychological, QOL, Others - Outcomes: PHQ 2/9: Depression screen [facility] 2/9 12/11/2016 11/05/2016  Decreased Interest 2 0  Down, Depressed, Hopeless 3 0  PHQ - 2 Score 5 0  Altered sleeping 3 -  Tired, decreased energy 2 -  Change in appetite 0 -  Feeling bad or failure about yourself  3 -  Trouble concentrating 3 -  Moving slowly or fidgety/restless 0 -  Suicidal thoughts 1 -  PHQ-9 Score 17 -  Difficult doing work/chores Extremely dIfficult -    Quality of Life: Quality of Life - 12/11/16 1420      Quality of Life Scores   Health/Function Pre  21 %    Socioeconomic Pre  19.71 %    Psych/Spiritual Pre  20.29 %     Family Pre  20.2 %    GLOBAL Pre  20.47 %       Personal Goals: Goals established at orientation with interventions provided to work toward goal. Personal Goals and Risk Factors at Admission - 12/11/16 1353      Core Components/Risk Factors/Patient Goals on Admission    Weight Management  Yes;Weight Loss;Obesity    Intervention  Weight Management: Develop a combined nutrition and exercise program designed to reach desired caloric intake, while maintaining appropriate intake of nutrient and fiber, sodium and fats, and appropriate energy expenditure required for the weight goal.;Weight Management: Provide education and appropriate resources to help participant work on and attain dietary goals.;Weight Management/Obesity: Establish reasonable short term and long term weight goals.;Obesity: Provide education and appropriate resources to help participant work on and attain dietary goals.    Admit Weight  282 lb 4.8 oz (128.1 kg)    Goal Weight: Short Term  278 lb (126.1 kg)    Goal Weight: Long Term  240 lb (108.9 kg)    Expected Outcomes  Short Term: Continue to assess and modify interventions until short term weight is achieved;Long Term: Adherence to nutrition and physical activity/exercise program aimed toward attainment of established weight goal;Weight Loss: Understanding of general recommendations for a balanced deficit meal plan, which promotes 1-2 lb weight loss per week and includes a negative energy balance of  [phone] kcal/d;Understanding recommendations for meals to include 15-35% energy as protein, 25-35% energy from fat, 35-60% energy from carbohydrates, less than 200mg  of dietary cholesterol, 20-35 gm of total fiber daily;Understanding of distribution of calorie intake throughout the day with the consumption of 4-5 meals/snacks    Heart Failure  Yes    Intervention  Provide a combined exercise and nutrition program that is supplemented with education, support and counseling about heart  failure. Directed toward relieving symptoms such as shortness of breath, decreased exercise tolerance, and extremity edema.    Expected Outcomes  Improve functional capacity of life;Short term: Attendance in program 2-3 days a week with increased exercise capacity. Reported lower sodium intake. Reported increased fruit and vegetable intake. Reports medication compliance.;Short term: Daily weights obtained and reported for increase. Utilizing diuretic protocols set by physician.;Long term: Adoption of self-care skills and reduction of barriers for early signs and symptoms recognition and intervention leading to self-care maintenance.    Stress  Yes He was surprised by his new heart event. He also stated having issues with poor coping skills, and financial issues.     Intervention  Offer individual and/or small group education and counseling on adjustment to heart disease, stress management and health-related lifestyle change. Teach and support self-help strategies.;Refer participants experiencing significant psychosocial distress to appropriate mental health specialists for further evaluation and treatment. When possible, include family members and significant others in education/counseling sessions.    Expected Outcomes  Short Term: Participant demonstrates changes in health-related behavior, relaxation and other stress management skills, ability to obtain effective social support, and compliance with psychotropic medications if prescribed.;Long Term: Emotional wellbeing is indicated by absence of clinically significant psychosocial distress or social isolation.        Personal Goals Discharge: Goals and Risk Factor Review    Row Name 02/12/17 1339             Core Components/Risk Factors/Patient Goals Review   Review  Only attended once since last review.           Exercise Goals and Review: Exercise Goals    Row Name 12/11/16 1439             Exercise Goals   Increase Physical  Activity  Yes       Intervention  Provide advice, education, support and counseling about physical activity/exercise needs.;Develop an individualized exercise prescription for aerobic and resistive training based on initial evaluation findings, risk stratification, comorbidities and participant's personal goals.       Expected Outcomes  Achievement of increased cardiorespiratory fitness and enhanced flexibility, muscular endurance and strength shown through measurements of functional capacity and personal statement of participant.       Increase Strength and Stamina  Yes       Intervention  Provide advice, education, support and counseling about physical activity/exercise needs.;Develop an individualized exercise prescription for aerobic and resistive training based on initial evaluation findings, risk stratification, comorbidities and participant's personal goals.       Expected Outcomes  Achievement of increased cardiorespiratory fitness and enhanced flexibility, muscular endurance and strength shown through measurements of functional capacity and personal statement of participant.       Able to understand and use rate of perceived exertion (RPE) scale  Yes       Intervention  Provide education and explanation on how to use RPE scale       Expected Outcomes  Short Term: Able to use RPE daily in rehab to express subjective intensity level;Long  Term:  Able to use RPE to guide intensity level when exercising independently       Able to understand and use Dyspnea scale  Yes       Intervention  Provide education and explanation on how to use Dyspnea scale       Expected Outcomes  Short Term: Able to use Dyspnea scale daily in rehab to express subjective sense of shortness of breath during exertion;Long Term: Able to use Dyspnea scale to guide intensity level when exercising independently       Knowledge and understanding of Target Heart Rate Range (THRR)  Yes       Intervention  Provide education and  explanation of THRR including how the numbers were predicted and where they are located for reference       Expected Outcomes  Short Term: Able to state/look up THRR;Long Term: Able to use THRR to govern intensity when exercising independently;Short Term: Able to use daily as guideline for intensity in rehab       Able to check pulse independently  Yes       Intervention  Provide education and demonstration on how to check pulse in carotid and radial arteries.;Review the importance of being able to check your own pulse for safety during independent exercise       Expected Outcomes  Short Term: Able to explain why pulse checking is important during independent exercise;Long Term: Able to check pulse independently and accurately       Understanding of Exercise Prescription  Yes       Intervention  Provide education, explanation, and written materials on patient's individual exercise prescription       Expected Outcomes  Short Term: Able to explain program exercise prescription;Long Term: Able to explain home exercise prescription to exercise independently          Nutrition & Weight - Outcomes: Pre Biometrics - 12/11/16 1436      Pre Biometrics   Height  6' (1.829 m)    Weight  282 lb 4.8 oz (128.1 kg)    Waist Circumference  47.5 inches    Hip Circumference  50 inches    Waist to Hip Ratio  0.95 %    BMI (Calculated)  38.28    Single Leg Stand  30 seconds        Nutrition: Nutrition Therapy & Goals - 12/25/16 1356      Nutrition Therapy   RD appointment defered  Yes for now       Nutrition Discharge: Nutrition Assessments - 12/11/16 1408      MEDFICTS Scores   Pre Score  42       Education Questionnaire Score: Knowledge Questionnaire Score - 12/11/16 1420      Knowledge Questionnaire Score   Pre Score  22/28 Correct answers reviewed with Ma Rings       Goals reviewed with patient; copy given to patient.

## 2017-02-24 NOTE — Progress Notes (Signed)
Cardiac Individual Treatment Plan  Patient Details  Name: Carlos Brewer. MRN: [id number] Date of Birth: 1985/10/31 Referring Provider:     Cardiac Rehab from 12/11/2016 in [facility] Cardiac and Pulmonary Rehab  Referring Provider  Gollan      Initial Encounter Date:    Cardiac Rehab from 12/11/2016 in [facility] Cardiac and Pulmonary Rehab  Date  12/11/16  Referring Provider  Rockey Situ      Visit Diagnosis: NSTEMI (non-ST elevated myocardial infarction) ([place])  Heart failure, chronic systolic ([place])  Patient's Home Medications on Admission:  Current Outpatient Medications:  .  acetaminophen (TYLENOL) 500 MG tablet, Take 2 tablets (1,000 mg total) by mouth every 6 (six) hours as needed. (Patient taking differently: Take 1,000 mg by mouth every 6 (six) hours as needed for mild pain or fever. ), Disp: 30 tablet, Rfl: 0 .  aspirin EC 81 MG EC tablet, Take 1 tablet (81 mg total) by mouth daily., Disp: 30 tablet, Rfl: 2 .  atorvastatin (LIPITOR) 40 MG tablet, Take 1 tablet (40 mg total) by mouth daily at 6 PM., Disp: 30 tablet, Rfl: 2 .  carvedilol (COREG) 3.125 MG tablet, Take 1 tablet (3.125 mg total) by mouth 2 (two) times daily with a meal., Disp: 60 tablet, Rfl: 2 .  fluticasone (FLONASE) 50 MCG/ACT nasal spray, Place 1 spray into both nostrils 2 (two) times daily., Disp: 16 g, Rfl: 0 .  loratadine (CLARITIN) 10 MG tablet, Take 1 tablet (10 mg total) by mouth daily., Disp: 30 tablet, Rfl: 1 .  mupirocin ointment (BACTROBAN) 2 %, Apply 1 application topically 2 (two) times daily. (Patient not taking: Reported on 12/11/2016), Disp: 22 g, Rfl: 0 .  nitroGLYCERIN (NITROSTAT) 0.4 MG SL tablet, Place 1 tablet (0.4 mg total) under the tongue every 5 (five) minutes as needed for chest pain., Disp: 30 tablet, Rfl: 2 .  sacubitril-valsartan (ENTRESTO) 24-26 MG, Take 1 tablet by mouth 2 (two) times daily., Disp: 60 tablet, Rfl: 3 .  sertraline (ZOLOFT) 100 MG tablet, TK 1 T PO QD, Disp: , Rfl: 2 .   sertraline (ZOLOFT) 25 MG tablet, Take 1 tablet (25 mg total) by mouth daily. (Patient not taking: Reported on 12/11/2016), Disp: 30 tablet, Rfl: 0 .  spironolactone (ALDACTONE) 25 MG tablet, Take 25 mg by mouth daily., Disp: , Rfl:  .  torsemide (DEMADEX) 20 MG tablet, Take 2 tablets (40 mg total) by mouth 2 (two) times daily., Disp: 120 tablet, Rfl: 7 .  traZODone (DESYREL) 50 MG tablet, Take 1 tablet (50 mg total) by mouth at bedtime., Disp: 30 tablet, Rfl: 0  Past Medical History: Past Medical History:  Diagnosis Date  . Cocaine abuse ([place])   . HFrEF (heart failure with reduced ejection fraction) ([place])    a. 10/2016 Echo: EF 15-20%, Gr2 DD, mildly dil LA/RA.  Marland Kitchen History of cardiac cath    a. 10/2016 Cath: LM nl, LAD min irregs, LCX nl, RCA nl, EF 15%.  . Morbid obesity ([place])   . Myocarditis ([place])    a. 10/2016 Admit w/ CHF and trop elevation; b. 10/2016 Echo: EF 15-20%; c. 10/2016 Cath: Min irregs in LAD otw nl cors, EF 15%, glob HK.  Marland Kitchen NICM (nonischemic cardiomyopathy) ([place])    a. 10/2016 Echo: EF 15-20%, Gr2 DD; b. 10/2016 Cath: Nl cors.  . Palpitations   . Tobacco abuse     Tobacco Use: Social History   Tobacco Use  Smoking Status Former Smoker  . Packs/day: 0.25  .  Types: Cigarettes  . Last attempt to quit: 10/23/2016  . Years since quitting: 0.3  Smokeless Tobacco Never Used    Labs: Recent Review [facility] for [facility] Cardiac and Pulmonary Rehab Latest Ref Rng & Units 10/26/2016   Cholestrol 0 - 200 mg/dL 213(H)   LDLCALC 0 - 99 mg/dL 130(H)   HDL >40 mg/dL 62   Trlycerides <150 mg/dL 106       Exercise Target Goals:    Exercise Program Goal: Individual exercise prescription set with THRR, safety & activity barriers. Participant demonstrates ability to understand and report RPE using BORG scale, to self-measure pulse accurately, and to acknowledge the importance of the exercise prescription.  Exercise Prescription Goal: Starting with aerobic activity 30 plus  minutes a day, 3 days per week for initial exercise prescription. Provide home exercise prescription and guidelines that participant acknowledges understanding prior to discharge.  Activity Barriers & Risk Stratification: Activity Barriers & Cardiac Risk Stratification - 12/11/16 1424      Activity Barriers & Cardiac Risk Stratification   Activity Barriers  Chest Pain/Angina He does not like to take his Nitro because he does not like the side effects. He does take his Aspirin and says that tends to help if he were to have pain.     Cardiac Risk Stratification  High       6 Minute Walk: 6 Minute Walk    Row Name 12/11/16 1437         6 Minute Walk   Phase  Initial     Distance  1548 feet     Walk Time  6 minutes     # of Rest Breaks  0     MPH  2.93     METS  5.1     RPE  11     VO2 Peak  17.85     Symptoms  No     Resting HR  80 bpm     Resting BP  110/76     Resting Oxygen Saturation   98 %     Exercise Oxygen Saturation  during 6 min walk  98 %     Max Ex. HR  115 bpm     Max Ex. BP  154/92     2 Minute Post BP  116/74        Oxygen Initial Assessment:   Oxygen Re-Evaluation:   Oxygen Discharge (Final Oxygen Re-Evaluation):   Initial Exercise Prescription: Initial Exercise Prescription - 12/11/16 1400      Date of Initial Exercise RX and Referring Provider   Date  12/11/16    Referring Provider  Gollan      Treadmill   MPH  3    Grade  4    Minutes  15    METs  4.95      Recumbant Bike   Level  8    RPM  60    Watts  120    Minutes  15    METs  5      Elliptical   Level  1    Speed  3    Minutes  15    METs  5      REL-XR   Level  10    Speed  50    Minutes  15    METs  5      Prescription Details   Frequency (times per week)  3    Duration  Progress to 45 minutes of aerobic exercise without signs/symptoms of physical distress      Intensity   THRR 40-80% of Max Heartrate  123-166    Ratings of Perceived Exertion  11-15     Perceived Dyspnea  0-4      Resistance Training   Training Prescription  Yes    Weight  5 lb.    Reps  10-15       Perform Capillary Blood Glucose checks as needed.  Exercise Prescription Changes: Exercise Prescription Changes    Row Name 12/11/16 1400 12/18/16 1300 12/31/16 1500 01/28/17 0800 02/12/17 1300     Response to Exercise   Blood Pressure (Admit)  110/76  126/78  138/80  -  148/96   Blood Pressure (Exercise)  154/94  154/80  160/88  -  150/92   Blood Pressure (Exit)  116/74  114/60  122/72  -  130/82   Heart Rate (Admit)  80 bpm  83 bpm  97 bpm  -  88 bpm   Heart Rate (Exercise)  115 bpm  131 bpm  133 bpm  -  140 bpm   Heart Rate (Exit)  77 bpm  98 bpm  96 bpm  -  85 bpm   Oxygen Saturation (Admit)  98 %  -  -  -  -   Oxygen Saturation (Exercise)  98 %  -  -  -  -   Rating of Perceived Exertion (Exercise)  11  12  12   -  12   Symptoms  -  none  none  -  none   Comments  -  first full day of exercise  -  -  -   Duration  -  Progress to 45 minutes of aerobic exercise without signs/symptoms of physical distress  Progress to 45 minutes of aerobic exercise without signs/symptoms of physical distress  -  Progress to 45 minutes of aerobic exercise without signs/symptoms of physical distress   Intensity  -  THRR unchanged  THRR unchanged  -  THRR unchanged     Progression   Progression  -  Continue to progress workloads to maintain intensity without signs/symptoms of physical distress.  Continue to progress workloads to maintain intensity without signs/symptoms of physical distress.  -  Continue to progress workloads to maintain intensity without signs/symptoms of physical distress.   Average METs  -  4.5  4.1  -  3.1     Resistance Training   Training Prescription  -  Yes  Yes  -  Yes   Weight  -  5 lb.  5 lb  -  5 lb   Reps  -  10-15  10-15  -  10-15     Interval Training   Interval Training  -  No  No  -  No     Treadmill   MPH  -  3  -  -  -   Grade  -  4  -  -  -    Minutes  -  15  -  -  -   METs  -  4.95  -  -  -     Recumbant Bike   Level  -  -  8  -  -   RPM  -  -  60  -  -   Watts  -  -  86  -  -   Minutes  -  -  15  -  -   METs  -  -  4.06  -  -     Elliptical   Level  -  -  -  -  1   Speed  -  -  -  -  3.5   Minutes  -  -  -  -  15     REL-XR   Level  -  8  10  -  10   Speed  -  -  50  -  -   Minutes  -  15  15  -  15   METs  -  4.1  4.2  -  3.1     Home Exercise Plan   Plans to continue exercise at  -  -  -  Home (comment) plans to walk 2 days a week and play basketball 1  Home (comment) plans to walk 2 days a week and play basketball 1   Frequency  -  -  -  Add 2 additional days to program exercise sessions.  Add 2 additional days to program exercise sessions.   Initial Home Exercises Provided  -  -  -  01/28/17  01/28/17      Exercise Comments: Exercise Comments    Row Name 12/15/16 0755           Exercise Comments  First full day of exercise!  Patient was oriented to gym and equipment including functions, settings, policies, and procedures.  Patient's individual exercise prescription and treatment plan were reviewed.  All starting workloads were established based on the results of the 6 minute walk test done at initial orientation visit.  The plan for exercise progression was also introduced and progression will be customized based on patient's performance and goals.          Exercise Goals and Review: Exercise Goals    Row Name 12/11/16 1439             Exercise Goals   Increase Physical Activity  Yes       Intervention  Provide advice, education, support and counseling about physical activity/exercise needs.;Develop an individualized exercise prescription for aerobic and resistive training based on initial evaluation findings, risk stratification, comorbidities and participant's personal goals.       Expected Outcomes  Achievement of increased cardiorespiratory fitness and enhanced flexibility, muscular endurance and  strength shown through measurements of functional capacity and personal statement of participant.       Increase Strength and Stamina  Yes       Intervention  Provide advice, education, support and counseling about physical activity/exercise needs.;Develop an individualized exercise prescription for aerobic and resistive training based on initial evaluation findings, risk stratification, comorbidities and participant's personal goals.       Expected Outcomes  Achievement of increased cardiorespiratory fitness and enhanced flexibility, muscular endurance and strength shown through measurements of functional capacity and personal statement of participant.       Able to understand and use rate of perceived exertion (RPE) scale  Yes       Intervention  Provide education and explanation on how to use RPE scale       Expected Outcomes  Short Term: Able to use RPE daily in rehab to express subjective intensity level;Long Term:  Able to use RPE to guide intensity level when exercising independently       Able to understand and use Dyspnea scale  Yes  Intervention  Provide education and explanation on how to use Dyspnea scale       Expected Outcomes  Short Term: Able to use Dyspnea scale daily in rehab to express subjective sense of shortness of breath during exertion;Long Term: Able to use Dyspnea scale to guide intensity level when exercising independently       Knowledge and understanding of Target Heart Rate Range (THRR)  Yes       Intervention  Provide education and explanation of THRR including how the numbers were predicted and where they are located for reference       Expected Outcomes  Short Term: Able to state/look up THRR;Long Term: Able to use THRR to govern intensity when exercising independently;Short Term: Able to use daily as guideline for intensity in rehab       Able to check pulse independently  Yes       Intervention  Provide education and demonstration on how to check pulse in carotid  and radial arteries.;Review the importance of being able to check your own pulse for safety during independent exercise       Expected Outcomes  Short Term: Able to explain why pulse checking is important during independent exercise;Long Term: Able to check pulse independently and accurately       Understanding of Exercise Prescription  Yes       Intervention  Provide education, explanation, and written materials on patient's individual exercise prescription       Expected Outcomes  Short Term: Able to explain program exercise prescription;Long Term: Able to explain home exercise prescription to exercise independently          Exercise Goals Re-Evaluation : Exercise Goals Re-Evaluation    Row Name 12/15/16 0754 12/18/16 1342 12/31/16 1547 01/14/17 1542 01/26/17 1307     Exercise Goal Re-Evaluation   Exercise Goals Review  Understanding of Exercise Prescription;Knowledge and understanding of Target Heart Rate Range (THRR);Able to understand and use rate of perceived exertion (RPE) scale  Increase Physical Activity;Increase Strength and Stamina  Increase Physical Activity;Increase Strength and Stamina  -  -   Comments  Reviewed RPE scale, THR and program prescription with pt today.  Pt voiced understanding and was given a copy of goals to take home.   Kinta has completed his first full day of exercise.  We will continue to monitor his progression throughout the program.  Eder continues to tolerate exercise well. Staff will monitor progress.  Out since last review  Out since last review, hopes to return on Wednesday   Expected Outcomes  Short: Use RPE daily to regulate intensity.  Long: Follow program prescription in THR.  Short: Attend classes regularly.  Long: Continue to follow program prescription.   Short - Staff will review hpme exercise with Broox.  Long - he will add 1-2 of exercise at home  -  -   Row Name 01/28/17 0850 02/12/17 1334           Exercise Goal Re-Evaluation   Exercise  Goals Review  Increase Physical Activity;Increase Strength and Stamina  Increase Physical Activity;Increase Strength and Stamina      Comments  Reviewed home exercise with pt today.  Pt plans to walk at home 2 days a week and play basketball 1 day a week for exercise.  Reviewed THR, pulse, RPE, sign and symptoms, NTG use, and when to call 911 or MD.  Also discussed weather considerations and indoor options.  Pt voiced understanding.  Teoman has only attended  once since last reivew.  He is now up to level 10 on the XR and using 5 lb weights.  We will continue to monitor his progression.       Expected Outcomes  Short: walk 2 days a week at home and play basketball 1 day a week. Long: add and manitain home exercise routine.  Short: Return to rehab regularly.  Long: Exercise routinely.         Discharge Exercise Prescription (Final Exercise Prescription Changes): Exercise Prescription Changes - 02/12/17 1300      Response to Exercise   Blood Pressure (Admit)  148/96    Blood Pressure (Exercise)  150/92    Blood Pressure (Exit)  130/82    Heart Rate (Admit)  88 bpm    Heart Rate (Exercise)  140 bpm    Heart Rate (Exit)  85 bpm    Rating of Perceived Exertion (Exercise)  12    Symptoms  none    Duration  Progress to 45 minutes of aerobic exercise without signs/symptoms of physical distress    Intensity  THRR unchanged      Progression   Progression  Continue to progress workloads to maintain intensity without signs/symptoms of physical distress.    Average METs  3.1      Resistance Training   Training Prescription  Yes    Weight  5 lb    Reps  10-15      Interval Training   Interval Training  No      Elliptical   Level  1    Speed  3.5    Minutes  15      REL-XR   Level  10    Minutes  15    METs  3.1      Home Exercise Plan   Plans to continue exercise at  Home (comment) plans to walk 2 days a week and play basketball 1    Frequency  Add 2 additional days to program exercise  sessions.    Initial Home Exercises Provided  01/28/17       Nutrition:  Target Goals: Understanding of nutrition guidelines, daily intake of sodium <1525m, cholesterol <2082m calories 30% from fat and 7% or less from saturated fats, daily to have 5 or more servings of fruits and vegetables.  Biometrics: Pre Biometrics - 12/11/16 1436      Pre Biometrics   Height  6' (1.829 m)    Weight  282 lb 4.8 oz (128.1 kg)    Waist Circumference  47.5 inches    Hip Circumference  50 inches    Waist to Hip Ratio  0.95 %    BMI (Calculated)  38.28    Single Leg Stand  30 seconds        Nutrition Therapy Plan and Nutrition Goals: Nutrition Therapy & Goals - 12/25/16 1356      Nutrition Therapy   RD appointment defered  Yes for now       Nutrition Discharge: Rate Your Plate Scores: Nutrition Assessments - 12/11/16 1408      MEDFICTS Scores   Pre Score  42       Nutrition Goals Re-Evaluation:   Nutrition Goals Discharge (Final Nutrition Goals Re-Evaluation):   Psychosocial: Target Goals: Acknowledge presence or absence of significant depression and/or stress, maximize coping skills, provide positive support system. Participant is able to verbalize types and ability to use techniques and skills needed for reducing stress and depression.   Initial Review &  Psychosocial Screening: Initial Psych Review & Screening - 12/11/16 1414      Initial Review   Current issues with  Current Depression;History of Depression;Current Anxiety/Panic;Current Psychotropic Meds;Current Sleep Concerns;Current Stress Concerns    Source of Stress Concerns  Poor Coping Skills;Financial;Chronic Illness;Unable to participate in former interests or hobbies    Comments  Jarred has a history of insomnia dating back to his days in the [employer]. Even with sleep medicine, he still usually sleeps only about 3 hours a night. For his depression, he was put on Zoloft and it was originally helping, but now he is  starting to feel more down lately. He wants to see if this program helps, but is stressed about other issues in his life that he did not want to talk about at this time.       Family Dynamics   Good Support System?  Yes Girlfriend and Mom       Screening Interventions   Interventions  Yes;Encouraged to exercise;Program counselor consult    Expected Outcomes  Short Term goal: Utilizing psychosocial counselor, staff and physician to assist with identification of specific Stressors or current issues interfering with healing process. Setting desired goal for each stressor or current issue identified.;Long Term Goal: Stressors or current issues are controlled or eliminated.;Short Term goal: Identification and review with participant of any Quality of Life or Depression concerns found by scoring the questionnaire.;Long Term goal: The participant improves quality of Life and PHQ9 Scores as seen by post scores and/or verbalization of changes       Quality of Life Scores:  Quality of Life - 12/11/16 1420      Quality of Life Scores   Health/Function Pre  21 %    Socioeconomic Pre  19.71 %    Psych/Spiritual Pre  20.29 %    Family Pre  20.2 %    GLOBAL Pre  20.47 %       PHQ-9: Recent Review Flowsheet Data    Depression screen [facility] 2/9 12/11/2016 11/05/2016   Decreased Interest 2 0   Down, Depressed, Hopeless 3 0   PHQ - 2 Score 5 0   Altered sleeping 3 -   Tired, decreased energy 2 -   Change in appetite 0 -   Feeling bad or failure about yourself  3 -   Trouble concentrating 3 -   Moving slowly or fidgety/restless 0 -   Suicidal thoughts 1 -   PHQ-9 Score 17 -   Difficult doing work/chores Extremely dIfficult  -     Interpretation of Total Score  Total Score Depression Severity:  1-4 = Minimal depression, 5-9 = Mild depression, 10-14 = Moderate depression, 15-19 = Moderately severe depression, 20-27 = Severe depression   Psychosocial Evaluation and Intervention: Psychosocial  Evaluation - 12/15/16 0947      Psychosocial Evaluation & Interventions   Interventions  Encouraged to exercise with the program and follow exercise prescription;Relaxation education;Stress management education    Comments  Counselor met with Mr. Pascal Ma Rings) today for initial psychosocial evaluation.  He is a 31 year old who is experiencing low-ejection fraction and some symptoms of Angina.  He has a limited support system with a mother and significant other he lives with.  He is an [place] who has experienced PTSD symptoms since then; with symptoms of problems sleeping; feeling bad about himself; and depressive symptoms with his PHQ-9 being a "17" indicating moderately severe depression. He is on medications for mood and for sleep  reporting the Zoloft has been increased and is being helpful; but the sleep meds are "hit or miss" with an average of (3) hours per night of sleep.  He admits to multiple stressors currently with finances and concerns about losing his job if he doesn't return soon.  He has goals to increase his confidence in his ability to work out regularly and decrease his anxiety about his health issues and ability to work out.  Counselor encouraged Daegan to consider a group for PTSD through the [facility] to develop skills to cope better.  Counselor will be following with Ma Rings throughout the course of this program.     Expected Outcomes  Ashrith will benefit from consistent exercise to achieve his stated goals.  The educational and psychoeducational components will be helpful in understanding and learning to cope better with his health and mental health condition.  [facility] for [facility] and consider attending.  Counselor will follow.    Continue Psychosocial Services   Follow up required by counselor       Psychosocial Re-Evaluation: Psychosocial Re-Evaluation    [place] Name 12/29/16 1002 01/26/17 1306           Psychosocial  Re-Evaluation   Current issues with  Current Sleep Concerns;History of Depression;Current Anxiety/Panic;Current Psychotropic Meds;Current Stress Concerns  Current Stress Concerns      Comments  Counselor met with Mr. Flanigan Cohen) today for follow up.  He reports not being consistent with class due to he and his significant other are moving currently.  He reports the increased Zoloft has been helpful and he especially recognized this when he forgot to take it two days and became extremely irritable.  Counselor had educated Kaesen on his personality seemingly being that of an introvert during our initial evaluation.  He stated this was very helpful in his relationship with his significant other as they researched this and he reports now she understands him better vs stating "something is wrong with him."  Ousmane reports his mood has remained about the same.  He continues to struggle with sleep problems and states the [employer] is "NOT WORKING!"  Counselor encouraged Anuar to mention this to the [place] again since he was diagnosed years ago with sleep apnea and never got the equipment to use to sleep at night.  Davidmichael agreed to do this.  Counselor mentioned a natural OTC sustained release sleep aid to mention to his Dr. or pharmacist in the meantime.  Counselor will continue to follow with Stiven.    Called to check on status of return.  He has been busy with his move and has not had gas money to get here. He will try to get here on Wednesday and we will give him a gas card to help.       Expected Outcomes  Lavern will become more consistent with exercise to help with his physiological and emotional/stress issues.  He will speak with his Dr. about an updated sleep study and the OTC natural aid PRN in the meantime.    Short: Return to rehab and use gas card.  Long: Attend regularly and given gas card every 3 visits      Interventions  Stress management education;Relaxation education  -      Continue  Psychosocial Services   Follow up required by counselor  Follow up required by counselor        Initial Review   Source of Stress Concerns  -  Transportation         Psychosocial Discharge (Final Psychosocial Re-Evaluation): Psychosocial Re-Evaluation - 01/26/17 1306      Psychosocial Re-Evaluation   Current issues with  Current Stress Concerns    Comments  Called to check on status of return.  He has been busy with his move and has not had gas money to get here. He will try to get here on Wednesday and we will give him a gas card to help.     Expected Outcomes  Short: Return to rehab and use gas card.  Long: Attend regularly and given gas card every 3 visits    Continue Psychosocial Services   Follow up required by counselor      Initial Review   Source of Stress Concerns  Transportation       Vocational Rehabilitation: Provide vocational rehab assistance to qualifying candidates.   Vocational Rehab Evaluation & Intervention: Vocational Rehab - 12/11/16 1421      Initial Vocational Rehab Evaluation & Intervention   Assessment shows need for Vocational Rehabilitation  No       Education: Education Goals: Education classes will be provided on a variety of topics geared toward better understanding of heart health and risk factor modification. Participant will state understanding/return demonstration of topics presented as noted by education test scores.  Learning Barriers/Preferences: Learning Barriers/Preferences - 12/11/16 1420      Learning Barriers/Preferences   Learning Barriers  None    Learning Preferences  None       Education Topics: General Nutrition Guidelines/Fats and Fiber: -Group instruction provided by verbal, written material, models and posters to present the general guidelines for heart healthy nutrition. Gives an explanation and review of dietary fats and fiber.   Cardiac Rehab from 01/28/2017 in [facility]  Date  12/29/16    Educator  CR  Instruction Review Code  1- Verbalizes Understanding      Controlling Sodium/Reading Food Labels: -Group verbal and written material supporting the discussion of sodium use in heart healthy nutrition. Review and explanation with models, verbal and written materials for utilization of the food label.   Exercise Physiology & Risk Factors: - Group verbal and written instruction with models to review the exercise physiology of the cardiovascular system and associated critical values. Details cardiovascular disease risk factors and the goals associated with each risk factor.   Aerobic Exercise & Resistance Training: - Gives group verbal and written discussion on the health impact of inactivity. On the components of aerobic and resistive training programs and the benefits of this training and how to safely progress through these programs.   Flexibility, Balance, General Exercise Guidelines: - Provides group verbal and written instruction on the benefits of flexibility and balance training programs. Provides general exercise guidelines with specific guidelines to those with heart or lung disease. Demonstration and skill practice provided.   Stress Management: - Provides group verbal and written instruction about the health risks of elevated stress, cause of high stress, and healthy ways to reduce stress.   Cardiac Rehab from 01/28/2017 in [facility]  Date  01/28/17  Educator  [facility]  Instruction Review Code  1- Verbalizes Understanding      Depression: - Provides group verbal and written instruction on the correlation between heart/lung disease and depressed mood, treatment options, and the stigmas associated with seeking treatment.   Anatomy & Physiology of the Heart: - Group verbal and written instruction and models provide basic cardiac anatomy  and physiology, with the coronary electrical and arterial systems. Review of: AMI, Angina, Valve disease,  Heart Failure, Cardiac Arrhythmia, Pacemakers, and the ICD.   Cardiac Procedures: - Group verbal and written instruction to review commonly prescribed medications for heart disease. Reviews the medication, class of the drug, and side effects. Includes the steps to properly store meds and maintain the prescription regimen. (beta blockers and nitrates)   Cardiac Medications I: - Group verbal and written instruction to review commonly prescribed medications for heart disease. Reviews the medication, class of the drug, and side effects. Includes the steps to properly store meds and maintain the prescription regimen.   Cardiac Rehab from 01/28/2017 in [facility] Cardiac [facility] Pulmonary Rehab  Date  12/15/16  Educator  [facility]  Instruction Review Code  1- Verbalizes Understanding      Cardiac Medications II: -Group verbal and written instruction to review commonly prescribed medications for heart disease. Reviews the medication, class of the drug, and side effects. (all other drug classes)    Go Sex-Intimacy & Heart Disease, Get SMART - Goal Setting: - Group verbal and written instruction through game format to discuss heart disease and the return to sexual intimacy. Provides group verbal and written material to discuss and apply goal setting through the application of the S.M.A.R.T. Method.   Other Matters of the Heart: - Provides group verbal, written materials and models to describe Heart Failure, Angina, Valve Disease, Peripheral Artery Disease, and Diabetes in the realm of heart disease. Includes description of the disease process and treatment options available to the cardiac patient.   Exercise & Equipment Safety: - Individual verbal instruction and demonstration of equipment use and safety with use of the equipment.   Cardiac Rehab from 01/28/2017 in [facility] Cardiac and Pulmonary Rehab  Date  12/11/16  Educator  [place]  Instruction Review Code  1- Verbalizes Understanding      Infection  Prevention: - Provides verbal and written material to individual with discussion of infection control including proper hand washing and proper equipment cleaning during exercise session.   Cardiac Rehab from 01/28/2017 in [facility] Rehab  Date  12/11/16  Educator  [place]  Instruction Review Code  1- Verbalizes Understanding      Falls Prevention: - Provides verbal and written material to individual with discussion of falls prevention and safety.   Cardiac Rehab from 01/28/2017 in [facility] Cardiac and Pulmonary Rehab  Date  12/11/16  Educator  [place]  Instruction Review Code  1- Verbalizes Understanding      Diabetes: - Individual verbal and written instruction to review signs/symptoms of diabetes, desired ranges of glucose level fasting, after meals and with exercise. Acknowledge that pre and post exercise glucose checks will be done for 3 sessions at entry of program.   Other: -Provides group and verbal instruction on various topics (see comments)    Knowledge Questionnaire Score: Knowledge Questionnaire Score - 12/11/16 1420      Knowledge Questionnaire Score   Pre Score  22/28 Correct answers reviewed with Darvell       Core Components/Risk Factors/Patient Goals at Admission: Personal Goals and Risk Factors at Admission - 12/11/16 1353      Core Components/Risk Factors/Patient Goals on Admission    Weight Management  Yes;Weight Loss;Obesity    Intervention  Weight Management: Develop a combined nutrition and exercise program designed to reach desired caloric intake, while maintaining appropriate intake of nutrient and fiber, sodium and fats, and appropriate energy expenditure required for the weight  goal.;Weight Management: Provide education and appropriate resources to help participant work on and attain dietary goals.;Weight Management/Obesity: Establish reasonable short term and long term weight goals.;Obesity: Provide education and appropriate resources to help  participant work on and attain dietary goals.    Admit Weight  282 lb 4.8 oz (128.1 kg)    Goal Weight: Short Term  278 lb (126.1 kg)    Goal Weight: Long Term  240 lb (108.9 kg)    Expected Outcomes  Short Term: Continue to assess and modify interventions until short term weight is achieved;Long Term: Adherence to nutrition and physical activity/exercise program aimed toward attainment of established weight goal;Weight Loss: Understanding of general recommendations for a balanced deficit meal plan, which promotes 1-2 lb weight loss per week and includes a negative energy balance of [phone] kcal/d;Understanding recommendations for meals to include 15-35% energy as protein, 25-35% energy from fat, 35-60% energy from carbohydrates, less than 26m of dietary cholesterol, 20-35 gm of total fiber daily;Understanding of distribution of calorie intake throughout the day with the consumption of 4-5 meals/snacks    Heart Failure  Yes    Intervention  Provide a combined exercise and nutrition program that is supplemented with education, support and counseling about heart failure. Directed toward relieving symptoms such as shortness of breath, decreased exercise tolerance, and extremity edema.    Expected Outcomes  Improve functional capacity of life;Short term: Attendance in program 2-3 days a week with increased exercise capacity. Reported lower sodium intake. Reported increased fruit and vegetable intake. Reports medication compliance.;Short term: Daily weights obtained and reported for increase. Utilizing diuretic protocols set by physician.;Long term: Adoption of self-care skills and reduction of barriers for early signs and symptoms recognition and intervention leading to self-care maintenance.    Stress  Yes He was surprised by his new heart event. He also stated having issues with poor coping skills, and financial issues.     Intervention  Offer individual and/or small group education and counseling on  adjustment to heart disease, stress management and health-related lifestyle change. Teach and support self-help strategies.;Refer participants experiencing significant psychosocial distress to appropriate mental health specialists for further evaluation and treatment. When possible, include family members and significant others in education/counseling sessions.    Expected Outcomes  Short Term: Participant demonstrates changes in health-related behavior, relaxation and other stress management skills, ability to obtain effective social support, and compliance with psychotropic medications if prescribed.;Long Term: Emotional wellbeing is indicated by absence of clinically significant psychosocial distress or social isolation.       Core Components/Risk Factors/Patient Goals Review:  Goals and Risk Factor Review    Row Name 02/12/17 1339             Core Components/Risk Factors/Patient Goals Review   Review  Only attended once since last review.           Core Components/Risk Factors/Patient Goals at Discharge (Final Review):  Goals and Risk Factor Review - 02/12/17 1339      Core Components/Risk Factors/Patient Goals Review   Review  Only attended once since last review.        ITP Comments: ITP Comments    Row Name 12/11/16 1347 12/24/16 0628 01/14/17 1541 01/21/17 0629 01/26/17 1306   ITP Comments  Med Review Completed. Initial ITP created. Diagnosis can be found in Office visit 11/07/16  30 day Review. Continue with ITP unless directed changes per Medical Director Review.   Called to check on status of return.  Left voicemail.  According to chart, pt was in ED last week for atypical chest pain with no known cause and is to follow up as outpatient.   30 day review. Continue with ITP unless directed changes per Medical Director review.  Remains absent from program with medical concerns  Called to check on status of return.  He has been busy with his move and has not had gas money to get  here. He will try to get here on Wednesday and we will give him a gas card to help.    [place] Name 02/10/17 1330 02/18/17 0555 02/24/17 1411       ITP Comments  Called to check on status of return.  Voicemail box was full, unable to leave message  30 day review. Continue with ITP unless directed changes per Medical Director review.   Johsua has not attended since 01/28/17.  We will discharge him at this time.   Discharge ITP sent and signed by Dr. Sabra Heck.  Discharge Summary routed to PCP and cardiologist.        Comments: Discharge ITP

## 2017-05-14 ENCOUNTER — Ambulatory Visit
Admission: EM | Admit: 2017-05-14 | Discharge: 2017-05-14 | Disposition: A | Discharge status: Home / Self Care | Payer: Commercial Managed Care - PPO | Attending: Family Medicine | Admitting: Family Medicine

## 2017-05-14 ENCOUNTER — Encounter: Payer: Self-pay | Admitting: *Deleted

## 2017-05-14 ENCOUNTER — Other Ambulatory Visit: Payer: Self-pay

## 2017-05-14 DIAGNOSIS — K1120 Sialoadenitis, unspecified: Secondary | ICD-10-CM | POA: Diagnosis not present

## 2017-05-14 MED ORDER — AMOXICILLIN-POT CLAVULANATE 875-125 MG PO TABS: 1.0000 | ORAL_TABLET | Freq: Two times a day (BID) | ORAL | 0 refills | Status: DC

## 2017-05-14 MED ORDER — NAPROXEN 500 MG PO TABS: 500.0000 mg | ORAL_TABLET | Freq: Two times a day (BID) | ORAL | 0 refills | Status: DC

## 2017-05-14 NOTE — Discharge Instructions (Signed)
Suck on sour hard candy throughout the day.  Follow up with ear nose and throat physician if not improving

## 2017-05-14 NOTE — ED Triage Notes (Signed)
PAtient started having right side lower jaw pain 3 days ago. No previous history of jaw pain.

## 2017-05-14 NOTE — ED Provider Notes (Signed)
[facility]    CSN: [id number] Arrival date & time: 05/14/17  1110     History   Chief Complaint Chief Complaint  Patient presents with  . Jaw Pain    HPI Carlos Brewer. is a 32 y.o. male.   HPI  32 year old male who presents with right-sided lower jaw pain that he has had for 3 days.  He states that it does hurt more when he chews.  He indicates that the area just below the angle of the jaw.  Also feels the pain at the base of his tongue posteriorly but deep in his throat where he cannot actually touch it.  He has had no problem with his teeth.  Denies any fever or chills.    Past Medical History:  Diagnosis Date  . Cocaine abuse ([place])   . HFrEF (heart failure with reduced ejection fraction) ([place])    a. 10/2016 Echo: EF 15-20%, Gr2 DD, mildly dil LA/RA.  Marland Kitchen History of cardiac cath    a. 10/2016 Cath: LM nl, LAD min irregs, LCX nl, RCA nl, EF 15%.  . Morbid obesity ([place])   . Myocarditis ([place])    a. 10/2016 Admit w/ CHF and trop elevation; b. 10/2016 Echo: EF 15-20%; c. 10/2016 Cath: Min irregs in LAD otw nl cors, EF 15%, glob HK.  Marland Kitchen NICM (nonischemic cardiomyopathy) ([place])    a. 10/2016 Echo: EF 15-20%, Gr2 DD; b. 10/2016 Cath: Nl cors.  . Palpitations   . Tobacco abuse     Patient Active Problem List   Diagnosis Date Noted  . Morbid obesity ([place]) 02/07/2017  . Nonischemic cardiomyopathy ([place]) 11/07/2016  . Chronic systolic heart failure ([place]) 11/05/2016  . Anxiety 11/05/2016  . Substance use disorder 11/05/2016  . Pneumonia 10/24/2016    Past Surgical History:  Procedure Laterality Date  . CORONARY ANGIOPLASTY    . RIGHT/LEFT HEART CATH AND CORONARY ANGIOGRAPHY N/A 10/27/2016   Procedure: RIGHT/LEFT HEART CATH AND CORONARY ANGIOGRAPHY;  Surgeon: Wellington Hampshire, MD;  Location: [place] [facility];  Service: Cardiovascular;  Laterality: N/A;  . WISDOM TOOTH EXTRACTION  2008       Home Medications    Prior to Admission medications     Medication Sig Start Date End Date Taking? Authorizing Provider  aspirin EC 81 MG EC tablet Take 1 tablet (81 mg total) by mouth daily. 10/30/16  Yes Demetrios Loll, MD  atorvastatin (LIPITOR) 40 MG tablet Take 1 tablet (40 mg total) by mouth daily at 6 PM. 10/29/16  Yes Demetrios Loll, MD  carvedilol (COREG) 3.125 MG tablet Take 1 tablet (3.125 mg total) by mouth 2 (two) times daily with a meal. 10/29/16  Yes Demetrios Loll, MD  sacubitril-valsartan (ENTRESTO) 24-26 MG Take 1 tablet by mouth 2 (two) times daily. 12/05/16  Yes Minna Merritts, MD  sertraline (ZOLOFT) 100 MG tablet TK 1 T PO QD 11/12/16  Yes [provider]  spironolactone (ALDACTONE) 25 MG tablet Take 25 mg by mouth daily.   Yes [provider]  torsemide (DEMADEX) 20 MG tablet Take 2 tablets (40 mg total) by mouth 2 (two) times daily. 11/07/16  Yes Minna Merritts, MD  traZODone (DESYREL) 50 MG tablet Take 1 tablet (50 mg total) by mouth at bedtime. 10/29/16  Yes Demetrios Loll, MD  acetaminophen (TYLENOL) 500 MG tablet Take 2 tablets (1,000 mg total) by mouth every 6 (six) hours as needed. Patient taking differently: Take 1,000 mg by mouth every 6 (  six) hours as needed for mild pain or fever.  10/12/16   Betancourt, Aura Fey, NP  amoxicillin-clavulanate (AUGMENTIN) 875-125 MG tablet Take 1 tablet by mouth every 12 (twelve) hours. 05/14/17   Lorin Picket, PA-C  naproxen (NAPROSYN) 500 MG tablet Take 1 tablet (500 mg total) by mouth 2 (two) times daily with a meal. 05/14/17   Lorin Picket, PA-C  nitroGLYCERIN (NITROSTAT) 0.4 MG SL tablet Place 1 tablet (0.4 mg total) under the tongue every 5 (five) minutes as needed for chest pain. 10/29/16   Demetrios Loll, MD    Family History Family History  Problem Relation Age of Onset  . Healthy Mother   . Healthy Father     Social History Social History   Tobacco Use  . Smoking status: Former Smoker    Packs/day: 0.25    Types: Cigarettes    Last attempt to quit: 10/23/2016     Years since quitting: 0.5  . Smokeless tobacco: Never Used  Substance Use Topics  . Alcohol use: No    Comment: none  . Drug use: No    Comment: 10/23/2016     Allergies   Patient has no known allergies.   Review of Systems Review of Systems  Constitutional: Positive for activity change. Negative for chills, fatigue and fever.  HENT: Positive for ear pain and trouble swallowing.   All other systems reviewed and are negative.    Physical Exam Triage Vital Signs ED Triage Vitals  Enc Vitals Group     BP 05/14/17 1140 139/88     Pulse Rate 05/14/17 1140 86     Resp 05/14/17 1140 16     Temp 05/14/17 1140 98.4 F (36.9 C)     Temp Source 05/14/17 1140 Oral     SpO2 05/14/17 1140 100 %     Weight 05/14/17 1144 273 lb (123.8 kg)     Height 05/14/17 1144 6' (1.829 m)     Head Circumference --      Peak Flow --      Pain Score 05/14/17 1143 4     Pain Loc --      Pain Edu? --      Excl. in [place]? --    No data found.  Updated Vital Signs BP 139/88 (BP Location: Left Arm)   Pulse 86   Temp 98.4 F (36.9 C) (Oral)   Resp 16   Ht 6' (1.829 m)   Wt 273 lb (123.8 kg)   SpO2 100%   BMI 37.03 kg/m   Visual Acuity Right Eye Distance:   Left Eye Distance:   Bilateral Distance:    Right Eye Near:   Left Eye Near:    Bilateral Near:     Physical Exam  Constitutional: He is oriented to person, place, and time. He appears well-developed and well-nourished. No distress.  HENT:  Head: Normocephalic.  Right Ear: External ear normal.  Left Ear: External ear normal.  Nose: Nose normal.  Mouth/Throat: Oropharynx is clear and moist. No oropharyngeal exudate.  Examination shows the patient to have a full beard obtruding contoures of his face.  Tenderness is maximal just below the angle of the jaw on the right reproducing his symptoms.  Patient inside his mouth on the right at the base of his tongue also is tender.  Teeth are in good repair.  No swelling of the gums no  evidence of any purulence.  He has no induration or fluctuance in the soft  tissues of the neck.  He does have tenderness with mastication over the TM joint this is mild.  Eyes: Pupils are equal, round, and reactive to light. Right eye exhibits no discharge. Left eye exhibits no discharge.  Neck: Normal range of motion. Neck supple.  Musculoskeletal: Normal range of motion.  Neurological: He is alert and oriented to person, place, and time.  Skin: Skin is warm and dry. He is not diaphoretic.  Psychiatric: He has a normal mood and affect. His behavior is normal. Judgment and thought content normal.  Nursing note and vitals reviewed.    UC Treatments / Results  Labs (all labs ordered are listed, but only abnormal results are displayed) Labs Reviewed - No data to display  EKG  EKG Interpretation None       Radiology No results found.  Procedures Procedures (including critical care time)  Medications Ordered in UC Medications - No data to display   Initial Impression / Assessment and Plan / UC Course  I have reviewed the triage vital signs and the nursing notes.  Pertinent labs & imaging results that were available during my care of the patient were reviewed by me and considered in my medical decision making (see chart for details).     Plan: 1. Test/x-ray results and diagnosis reviewed with patient 2. rx as per orders; risks, benefits, potential side effects reviewed with patient 3. Recommend supportive treatment with going on sour hard candy throughout the day.  We will place him on Augmentin for 7 days.  Also provide him with Naprosyn for pain control.  Provided him the name of an ENT that he may follow-up if he is not improving 4. F/u prn if symptoms worsen or don't improve   Final Clinical Impressions(s) / UC Diagnoses   Final diagnoses:  Sialadenitis    ED Discharge Orders        Ordered    amoxicillin-clavulanate (AUGMENTIN) 875-125 MG tablet  Every 12 hours,    Status:  Discontinued     05/14/17 1217    naproxen (NAPROSYN) 500 MG tablet  2 times daily with meals,   Status:  Discontinued     05/14/17 1217    amoxicillin-clavulanate (AUGMENTIN) 875-125 MG tablet  Every 12 hours     05/14/17 1224    naproxen (NAPROSYN) 500 MG tablet  2 times daily with meals     05/14/17 1224       Controlled Substance Prescriptions [place] Controlled Substance Registry consulted? Not Applicable   Lorin Picket, PA-C 05/14/17 1231

## 2017-07-01 ENCOUNTER — Other Ambulatory Visit: Payer: Self-pay | Admitting: Cardiovascular Disease

## 2017-07-10 ENCOUNTER — Telehealth: Payer: Self-pay | Admitting: Cardiovascular Disease

## 2017-07-10 NOTE — Telephone Encounter (Signed)
-----   Message from Blain Pais sent at 07/03/2017  1:16 PM EDT ----- Regarding: RE: patient needs an appointment  l mom to call and schedule f/u appt ----- Message ----- From: Janan Ridge, CMA Sent: 07/01/2017  11:52 AM To: [employer] Support Pool Subject: patient needs an appointment                   Can you please try to contact patient and schedule an appointment with Dr. Rockey Situ. Patient was last seen 10/2016 and was instructed to follow up in 3 months and never did.   Thank you!

## 2017-07-10 NOTE — Telephone Encounter (Signed)
No ans no vm   °

## 2017-07-14 NOTE — Telephone Encounter (Signed)
Lmov for patient to call back and schedule

## 2017-07-17 ENCOUNTER — Encounter: Payer: Self-pay | Admitting: Cardiovascular Disease

## 2017-07-17 NOTE — Telephone Encounter (Signed)
No ans no vm .   °Mailed Letter  °

## 2017-08-03 ENCOUNTER — Emergency Department: Payer: Commercial Managed Care - PPO

## 2017-08-03 ENCOUNTER — Encounter: Payer: Self-pay | Admitting: Emergency Medicine

## 2017-08-03 ENCOUNTER — Emergency Department
Admission: EM | Admit: 2017-08-03 | Discharge: 2017-08-03 | Disposition: A | Discharge status: Home / Self Care | Payer: Commercial Managed Care - PPO | Attending: Emergency Medicine | Admitting: Emergency Medicine

## 2017-08-03 ENCOUNTER — Other Ambulatory Visit: Payer: Self-pay

## 2017-08-03 DIAGNOSIS — S3992XA Unspecified injury of lower back, initial encounter: Secondary | ICD-10-CM | POA: Diagnosis not present

## 2017-08-03 DIAGNOSIS — Y92411 Interstate highway as the place of occurrence of the external cause: Secondary | ICD-10-CM | POA: Insufficient documentation

## 2017-08-03 DIAGNOSIS — Z79899 Other long term (current) drug therapy: Secondary | ICD-10-CM | POA: Diagnosis not present

## 2017-08-03 DIAGNOSIS — Z87891 Personal history of nicotine dependence: Secondary | ICD-10-CM | POA: Diagnosis not present

## 2017-08-03 DIAGNOSIS — Y999 Unspecified external cause status: Secondary | ICD-10-CM | POA: Diagnosis not present

## 2017-08-03 DIAGNOSIS — Z7982 Long term (current) use of aspirin: Secondary | ICD-10-CM | POA: Diagnosis not present

## 2017-08-03 DIAGNOSIS — Y939 Activity, unspecified: Secondary | ICD-10-CM | POA: Insufficient documentation

## 2017-08-03 MED ORDER — KETOROLAC TROMETHAMINE 10 MG PO TABS: 10.0000 mg | ORAL_TABLET | Freq: Four times a day (QID) | ORAL | 0 refills | Status: DC | PRN

## 2017-08-03 MED ORDER — KETOROLAC TROMETHAMINE 30 MG/ML IJ SOLN
30.0000 mg | Freq: Once | INTRAMUSCULAR | Status: AC
  Administered 2017-08-03: 30 mg via INTRAVENOUS
  Filled 2017-08-03: qty 1
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
## 2017-08-31 ENCOUNTER — Ambulatory Visit
Admission: EM | Admit: 2017-08-31 | Discharge: 2017-08-31 | Disposition: A | Discharge status: Home / Self Care | Payer: Commercial Managed Care - PPO | Attending: Family Medicine | Admitting: Family Medicine

## 2017-08-31 ENCOUNTER — Encounter: Payer: Self-pay | Admitting: Emergency Medicine

## 2017-08-31 ENCOUNTER — Other Ambulatory Visit: Payer: Self-pay

## 2017-08-31 ENCOUNTER — Ambulatory Visit (INDEPENDENT_AMBULATORY_CARE_PROVIDER_SITE_OTHER): Payer: Commercial Managed Care - PPO

## 2017-08-31 DIAGNOSIS — R0981 Nasal congestion: Secondary | ICD-10-CM

## 2017-08-31 DIAGNOSIS — R05 Cough: Secondary | ICD-10-CM | POA: Diagnosis not present

## 2017-08-31 DIAGNOSIS — J029 Acute pharyngitis, unspecified: Secondary | ICD-10-CM

## 2017-08-31 DIAGNOSIS — J988 Other specified respiratory disorders: Secondary | ICD-10-CM

## 2017-08-31 MED ORDER — IPRATROPIUM-ALBUTEROL 0.5-2.5 (3) MG/3ML IN SOLN
3.0000 mL | Freq: Once | RESPIRATORY_TRACT | Status: AC
  Administered 2017-08-31: 3 mL via RESPIRATORY_TRACT

## 2017-08-31 MED ORDER — ALBUTEROL SULFATE HFA 108 (90 BASE) MCG/ACT IN AERS: 1.0000 | INHALATION_SPRAY | Freq: Four times a day (QID) | RESPIRATORY_TRACT | 0 refills | Status: DC | PRN

## 2017-08-31 MED ORDER — PREDNISONE 50 MG PO TABS: ORAL_TABLET | ORAL | 0 refills | Status: DC

## 2017-08-31 NOTE — ED Triage Notes (Addendum)
Patient in today c/o productive cough, chest congestion and sob, headache and body aches x 2 days. Patient states he has been feverish, but hasn't taken temperature. Patient states he has a lot of "white stuff" on his tongue.

## 2017-08-31 NOTE — ED Provider Notes (Signed)
[facility]    CSN: [id number] Arrival date & time: 08/31/17  1532     History   Chief Complaint Chief Complaint  Patient presents with  . Cough  . chest congestion   HPI  32 year old male with an extensive past medical history including congestive heart failure and nonischemic cardia myopathy presents with the above complaints.  Patient states that his symptoms started abruptly yesterday.  He reports productive cough, sneezing, runny nose, sore throat.  He reports ongoing shortness of breath.  He has stopped many of his heart failure medications as of 6 months ago.  No fever or chills.  No known exacerbating relieving factors.  No reports of weight gain or edema.  No other associated symptoms.  No other complaints.  Past Medical History:  Diagnosis Date  . Cocaine abuse ([place])   . HFrEF (heart failure with reduced ejection fraction) ([place])    a. 10/2016 Echo: EF 15-20%, Gr2 DD, mildly dil LA/RA.  Marland Kitchen History of cardiac cath    a. 10/2016 Cath: LM nl, LAD min irregs, LCX nl, RCA nl, EF 15%.  . Morbid obesity ([place])   . Myocarditis ([place])    a. 10/2016 Admit w/ CHF and trop elevation; b. 10/2016 Echo: EF 15-20%; c. 10/2016 Cath: Min irregs in LAD otw nl cors, EF 15%, glob HK.  Marland Kitchen NICM (nonischemic cardiomyopathy) ([place])    a. 10/2016 Echo: EF 15-20%, Gr2 DD; b. 10/2016 Cath: Nl cors.  . Palpitations   . Tobacco abuse     Patient Active Problem List   Diagnosis Date Noted  . Morbid obesity ([place]) 02/07/2017  . Nonischemic cardiomyopathy ([place]) 11/07/2016  . Chronic systolic heart failure ([place]) 11/05/2016  . Anxiety 11/05/2016  . Substance use disorder 11/05/2016  . Pneumonia 10/24/2016    Past Surgical History:  Procedure Laterality Date  . CORONARY ANGIOPLASTY    . RIGHT/LEFT HEART CATH AND CORONARY ANGIOGRAPHY N/A 10/27/2016   Procedure: RIGHT/LEFT HEART CATH AND CORONARY ANGIOGRAPHY;  Surgeon: Wellington Hampshire, MD;  Location: [facility];  Service:  Cardiovascular;  Laterality: N/A;  . WISDOM TOOTH EXTRACTION  2008       Home Medications    Prior to Admission medications   Medication Sig Start Date End Date Taking? Authorizing Provider  aspirin EC 81 MG EC tablet Take 1 tablet (81 mg total) by mouth daily. 10/30/16  Yes Demetrios Loll, MD  sertraline (ZOLOFT) 100 MG tablet TK 1 T PO QD 11/12/16  Yes [provider]  torsemide (DEMADEX) 20 MG tablet Take 2 tablets (40 mg total) by mouth 2 (two) times daily. 11/07/16  Yes Minna Merritts, MD  acetaminophen (TYLENOL) 500 MG tablet Take 2 tablets (1,000 mg total) by mouth every 6 (six) hours as needed. Patient taking differently: Take 1,000 mg by mouth every 6 (six) hours as needed for mild pain or fever.  10/12/16   Betancourt, Aura Fey, NP  albuterol (PROVENTIL HFA;VENTOLIN HFA) 108 (90 Base) MCG/ACT inhaler Inhale 1-2 puffs into the lungs every 6 (six) hours as needed for wheezing or shortness of breath. 08/31/17   Coral Spikes, DO  atorvastatin (LIPITOR) 40 MG tablet Take 1 tablet (40 mg total) by mouth daily at 6 PM. 10/29/16   Demetrios Loll, MD  carvedilol (COREG) 3.125 MG tablet Take 1 tablet (3.125 mg total) by mouth 2 (two) times daily with a meal. 10/29/16   Demetrios Loll, MD  nitroGLYCERIN (NITROSTAT) 0.4 MG SL tablet Place 1 tablet (  0.4 mg total) under the tongue every 5 (five) minutes as needed for chest pain. 10/29/16   Demetrios Loll, MD  predniSONE (DELTASONE) 50 MG tablet 1 tablet daily x 5 days. 08/31/17   Gladis Soley, Barnie Del, DO  sacubitril-valsartan (ENTRESTO) 24-26 MG Take 1 tablet by mouth 2 (two) times daily. 12/05/16   Minna Merritts, MD  spironolactone (ALDACTONE) 25 MG tablet Take 25 mg by mouth daily.    [provider]  traZODone (DESYREL) 50 MG tablet Take 1 tablet (50 mg total) by mouth at bedtime. 10/29/16   Demetrios Loll, MD    Family History Family History  Problem Relation Age of Onset  . Healthy Mother   . Healthy Father     Social History Social History    Tobacco Use  . Smoking status: Former Smoker    Packs/day: 0.25    Types: Cigarettes    Last attempt to quit: 10/23/2016    Years since quitting: 0.8  . Smokeless tobacco: Never Used  Substance Use Topics  . Alcohol use: No    Comment: none  . Drug use: Not Currently    Types: Cocaine    Comment: quit 10/23/2016      Allergies   Patient has no known allergies.   Review of Systems Review of Systems  Constitutional: Negative for fever.  HENT: Positive for congestion, rhinorrhea, sneezing and sore throat.   Respiratory: Positive for cough and shortness of breath.    Physical Exam Triage Vital Signs ED Triage Vitals  Enc Vitals Group     BP 08/31/17 1556 123/76     Pulse Rate 08/31/17 1556 (!) 103     Resp 08/31/17 1556 (!) 24     Temp 08/31/17 1556 98.6 F (37 C)     Temp Source 08/31/17 1556 Oral     SpO2 08/31/17 1556 96 %     Weight 08/31/17 1557 300 lb (136.1 kg)     Height 08/31/17 1557 6' (1.829 m)     Head Circumference --      Peak Flow --      Pain Score 08/31/17 1556 4     Pain Loc --      Pain Edu? --      Excl. in [place]? --    Updated Vital Signs BP 123/76 (BP Location: Left Arm)   Pulse (!) 103   Temp 98.6 F (37 C) (Oral)   Resp (!) 24   Ht 6' (1.829 m)   Wt 300 lb (136.1 kg)   SpO2 96%   BMI 40.69 kg/m   Physical Exam  Constitutional: He is oriented to person, place, and time. He appears well-developed.  Mild increased work of breathing.  HENT:  Head: Normocephalic and atraumatic.  Oropharynx with mild erythema.  Cardiovascular:  Tachycardic.  Regular rhythm.  Pulmonary/Chest:  Mild increased work of breathing.  No adventitious breath sounds.  Neurological: He is alert and oriented to person, place, and time.  Psychiatric: He has a normal mood and affect. His behavior is normal.  Nursing note and vitals reviewed.  UC Treatments / Results  Labs (all labs ordered are listed, but only abnormal results are displayed) Labs Reviewed - No  data to display  EKG None  Radiology Dg Chest 2 View  Result Date: 08/31/2017 CLINICAL DATA:  Cough and shortness of breath EXAM: CHEST - 2 VIEW COMPARISON:  January 09, 2017 FINDINGS: Lungs are clear. Heart size and pulmonary vascularity are normal. No adenopathy. No  bone lesions. IMPRESSION: No edema or consolidation. Electronically Signed   By: Lowella Grip III M.D.   On: 08/31/2017 16:30    Procedures Procedures (including critical care time)  Medications Ordered in UC Medications  ipratropium-albuterol (DUONEB) 0.5-2.5 (3) MG/3ML nebulizer solution 3 mL (3 mLs Nebulization Given 08/31/17 1639)    Initial Impression / Assessment and Plan / UC Course  I have reviewed the triage vital signs and the nursing notes.  Pertinent labs & imaging results that were available during my care of the patient were reviewed by me and considered in my medical decision making (see chart for details).    32 year old male presents with a respiratory infection.  X-ray was obtained given his issues with congestive heart failure.  No evidence of consolidation or edema.  Advised that he needs to take his medications as prescribed.  Needs follow-up with cardiology.  Patient had improvement with DuoNeb.  Sending home on albuterol.  Prednisone as prescribed.  Final Clinical Impressions(s) / UC Diagnoses   Final diagnoses:  Respiratory infection     Discharge Instructions     Meds as prescribed.  Go see your heart doctor.  Take care  Dr. Lacinda Axon    ED Prescriptions    Medication Sig Dispense Auth. Provider   albuterol (PROVENTIL HFA;VENTOLIN HFA) 108 (90 Base) MCG/ACT inhaler Inhale 1-2 puffs into the lungs every 6 (six) hours as needed for wheezing or shortness of breath. 1 Inhaler Minnie Legros G, DO   predniSONE (DELTASONE) 50 MG tablet 1 tablet daily x 5 days. 5 tablet Coral Spikes, DO     Controlled Substance Prescriptions [place] Controlled Substance Registry consulted? Not Applicable     Coral Spikes, DO 08/31/17 1736

## 2017-08-31 NOTE — Discharge Instructions (Signed)
Meds as prescribed.  Go see your heart doctor.  Take care  Dr. Lacinda Axon

## 2017-09-30 ENCOUNTER — Emergency Department
Admission: EM | Admit: 2017-09-30 | Discharge: 2017-09-30 | Disposition: A | Discharge status: Home / Self Care | Payer: Commercial Managed Care - PPO | Attending: Emergency Medicine | Admitting: Emergency Medicine

## 2017-09-30 ENCOUNTER — Encounter: Payer: Self-pay | Admitting: *Deleted

## 2017-09-30 ENCOUNTER — Emergency Department: Payer: Commercial Managed Care - PPO

## 2017-09-30 ENCOUNTER — Other Ambulatory Visit: Payer: Self-pay

## 2017-09-30 DIAGNOSIS — I5022 Chronic systolic (congestive) heart failure: Secondary | ICD-10-CM | POA: Insufficient documentation

## 2017-09-30 DIAGNOSIS — Z79899 Other long term (current) drug therapy: Secondary | ICD-10-CM | POA: Diagnosis not present

## 2017-09-30 DIAGNOSIS — Z87891 Personal history of nicotine dependence: Secondary | ICD-10-CM | POA: Insufficient documentation

## 2017-09-30 DIAGNOSIS — Z7982 Long term (current) use of aspirin: Secondary | ICD-10-CM | POA: Insufficient documentation

## 2017-09-30 DIAGNOSIS — R0602 Shortness of breath: Secondary | ICD-10-CM | POA: Insufficient documentation

## 2017-09-30 MED ORDER — ALBUTEROL SULFATE (2.5 MG/3ML) 0.083% IN NEBU
INHALATION_SOLUTION | RESPIRATORY_TRACT | Status: AC
  Administered 2017-09-30: 5 mg via RESPIRATORY_TRACT
  Filled 2017-09-30: qty 6

## 2017-09-30 MED ORDER — ALBUTEROL SULFATE (2.5 MG/3ML) 0.083% IN NEBU
5.0000 mg | INHALATION_SOLUTION | Freq: Once | RESPIRATORY_TRACT | Status: AC
  Administered 2017-09-30: 5 mg via RESPIRATORY_TRACT

## 2017-09-30 NOTE — ED Provider Notes (Signed)
[facility] Emergency Department Provider Note  ____________________________________________   First MD Initiated Contact with Patient 09/30/17 1658     (approximate)  I have reviewed the triage vital signs and the nursing notes.   HISTORY  Chief Complaint Shortness of Breath    HPI Carlos Brewer. is a 32 y.o. male whose past medical history includes cocaine abuse leading to cardiomyopathy with an ejection fraction between 15 and 20% as of approximately 1 year ago.  He presents for evaluation of increased shortness of breath over the last 2 days.  The shortness of breath is worse at night and he says he knows he has sleep apnea and he is awaiting an appointment at the [facility] for further evaluation both of his heart problems and of the sleep apnea.  He states he thinks he has gained some fluid weight.  Exertion and position do not seem to make the symptoms any worse.  He has had no swelling in his abdomen or his legs.  He states that the cardiologist, Dr. Rockey Situ, put him on too much medication last year so he does not take any of it except for torsemide 20 mg once daily (he was prescribed, in addition to everything else on his list, torsemide 40 mg twice daily).  He states he has been doing fine on this regimen and does not need to take anything else.  He was seen a month ago by his PCP for shortness of breath and was given a prescription for an albuterol inhaler but he did not fill the prescription.  He describes some wheezing in the mornings.  He is currently asymptomatic.  Nothing in particular made the symptoms better.  He describes them as mild to moderate overall.  Past Medical History:  Diagnosis Date  . Cocaine abuse ([place])   . HFrEF (heart failure with reduced ejection fraction) ([place])    a. 10/2016 Echo: EF 15-20%, Gr2 DD, mildly dil LA/RA.  Marland Kitchen History of cardiac cath    a. 10/2016 Cath: LM nl, LAD min irregs, LCX nl, RCA nl, EF 15%.  . Morbid obesity ([place])    . Myocarditis ([place])    a. 10/2016 Admit w/ CHF and trop elevation; b. 10/2016 Echo: EF 15-20%; c. 10/2016 Cath: Min irregs in LAD otw nl cors, EF 15%, glob HK.  Marland Kitchen NICM (nonischemic cardiomyopathy) ([place])    a. 10/2016 Echo: EF 15-20%, Gr2 DD; b. 10/2016 Cath: Nl cors.  . Palpitations   . Tobacco abuse     Patient Active Problem List   Diagnosis Date Noted  . Morbid obesity ([place]) 02/07/2017  . Nonischemic cardiomyopathy ([place]) 11/07/2016  . Chronic systolic heart failure ([place]) 11/05/2016  . Anxiety 11/05/2016  . Substance use disorder 11/05/2016  . Pneumonia 10/24/2016    Past Surgical History:  Procedure Laterality Date  . CORONARY ANGIOPLASTY    . RIGHT/LEFT HEART CATH AND CORONARY ANGIOGRAPHY N/A 10/27/2016   Procedure: RIGHT/LEFT HEART CATH AND CORONARY ANGIOGRAPHY;  Surgeon: Wellington Hampshire, MD;  Location: [facility];  Service: Cardiovascular;  Laterality: N/A;  . WISDOM TOOTH EXTRACTION  2008    Prior to Admission medications   Medication Sig Start Date End Date Taking? Authorizing Provider  acetaminophen (TYLENOL) 500 MG tablet Take 2 tablets (1,000 mg total) by mouth every 6 (six) hours as needed. Patient taking differently: Take 1,000 mg by mouth every 6 (six) hours as needed for mild pain or fever.  10/12/16   Gerarda Fraction  A, NP  albuterol (PROVENTIL HFA;VENTOLIN HFA) 108 (90 Base) MCG/ACT inhaler Inhale 1-2 puffs into the lungs every 6 (six) hours as needed for wheezing or shortness of breath. 08/31/17   Coral Spikes, DO  aspirin EC 81 MG EC tablet Take 1 tablet (81 mg total) by mouth daily. 10/30/16   Demetrios Loll, MD  atorvastatin (LIPITOR) 40 MG tablet Take 1 tablet (40 mg total) by mouth daily at 6 PM. 10/29/16   Demetrios Loll, MD  carvedilol (COREG) 3.125 MG tablet Take 1 tablet (3.125 mg total) by mouth 2 (two) times daily with a meal. 10/29/16   Demetrios Loll, MD  nitroGLYCERIN (NITROSTAT) 0.4 MG SL tablet Place 1 tablet (0.4 mg total) under the tongue every 5 (five)  minutes as needed for chest pain. 10/29/16   Demetrios Loll, MD  predniSONE (DELTASONE) 50 MG tablet 1 tablet daily x 5 days. 08/31/17   Cook, Barnie Del, DO  sacubitril-valsartan (ENTRESTO) 24-26 MG Take 1 tablet by mouth 2 (two) times daily. 12/05/16   Minna Merritts, MD  sertraline (ZOLOFT) 100 MG tablet TK 1 T PO QD 11/12/16   [provider]  spironolactone (ALDACTONE) 25 MG tablet Take 25 mg by mouth daily.    [provider]  torsemide (DEMADEX) 20 MG tablet Take 2 tablets (40 mg total) by mouth 2 (two) times daily. 11/07/16   Minna Merritts, MD  traZODone (DESYREL) 50 MG tablet Take 1 tablet (50 mg total) by mouth at bedtime. 10/29/16   Demetrios Loll, MD    Allergies Patient has no known allergies.  Family History  Problem Relation Age of Onset  . Healthy Mother   . Healthy Father     Social History Social History   Tobacco Use  . Smoking status: Former Smoker    Packs/day: 0.25    Types: Cigarettes    Last attempt to quit: 10/23/2016    Years since quitting: 0.9  . Smokeless tobacco: Never Used  Substance Use Topics  . Alcohol use: No    Comment: none  . Drug use: Not Currently    Types: Cocaine    Comment: quit 10/23/2016     Review of Systems Constitutional: No fever/chills Eyes: No visual changes. ENT: No sore throat. Cardiovascular: Denies chest pain. Respiratory: Shortness of breath as described above Gastrointestinal: No abdominal pain.  No nausea, no vomiting.  No diarrhea.  No constipation. Genitourinary: Negative for dysuria. Musculoskeletal: Negative for neck pain.  Negative for back pain.  No swelling in the legs or abdomen Integumentary: Negative for rash. Neurological: Negative for headaches, focal weakness or numbness.   ____________________________________________   PHYSICAL EXAM:  VITAL SIGNS: ED Triage Vitals  Enc Vitals Group     BP 09/30/17 1553 122/76     Pulse Rate 09/30/17 1553 (!) 103     Resp 09/30/17 1553 20     Temp  09/30/17 1553 98.5 F (36.9 C)     Temp Source 09/30/17 1553 Oral     SpO2 09/30/17 1553 98 %     Weight 09/30/17 1556 136.1 kg (300 lb)     Height 09/30/17 1556 1.803 m (5\' 11" )     Head Circumference --      Peak Flow --      Pain Score 09/30/17 1601 0     Pain Loc --      Pain Edu? --      Excl. in [place]? --     Constitutional: Alert and  oriented. Well appearing and in no acute distress. Eyes: Conjunctivae are normal.  Head: Atraumatic. Nose: No congestion/rhinnorhea. Mouth/Throat: Mucous membranes are moist. Neck: No stridor.  No meningeal signs.   Cardiovascular: Normal rate, regular rhythm. Good peripheral circulation. Grossly normal heart sounds. Respiratory: Normal respiratory effort.  No retractions. Lungs CTAB. Gastrointestinal: Soft and nontender. No distention.  Musculoskeletal: No lower extremity tenderness nor edema. No gross deformities of extremities. Neurologic:  Normal speech and language. No gross focal neurologic deficits are appreciated.  Skin:  Skin is warm, dry and intact. No rash noted. Psychiatric: Mood and affect are normal. Speech and behavior are normal.  ____________________________________________   LABS (all labs ordered are listed, but only abnormal results are displayed)  Labs Reviewed - No data to display ____________________________________________  EKG  ED ECG REPORT I, Hinda Kehr, the attending physician, personally viewed and interpreted this ECG.  Date: 09/30/2017 EKG Time: 16: 07 Rate: 89 Rhythm: normal sinus rhythm QRS Axis: normal Intervals: Incomplete left bundle branch block ST/T Wave abnormalities: Non-specific ST segment / T-wave changes, but no evidence of acute ischemia. Narrative Interpretation: no evidence of acute ischemia   ____________________________________________  RADIOLOGY I, Hinda Kehr, personally viewed and evaluated these images (plain radiographs) as part of my medical decision making, as well as  reviewing the written report by the radiologist.  ED MD interpretation: No acute abnormalities on chest x-ray  Official radiology report(s): Dg Chest 2 View  Result Date: 09/30/2017 CLINICAL DATA:  32 year old male with a history of shortness of breath EXAM: CHEST - 2 VIEW COMPARISON:  A 08/31/2017, 01/09/2017 FINDINGS: Cardiomediastinal silhouette unchanged in size and contour. No evidence of central vascular congestion. No pneumothorax or pleural effusion. No confluent airspace disease. No acute displaced fracture IMPRESSION: Negative for acute cardiopulmonary disease Electronically Signed   By: Corrie Mckusick D.O.   On: 09/30/2017 16:39    ____________________________________________   PROCEDURES  Critical Care performed: No   Procedure(s) performed:   Procedures   ____________________________________________   INITIAL IMPRESSION / ASSESSMENT AND PLAN / ED COURSE  As part of my medical decision making, I reviewed the following data within the [place] notes reviewed and incorporated, EKG interpreted , Old chart reviewed, Radiograph reviewed  and Notes from prior ED visits    Differential diagnosis includes but is not limited to CHF exacerbation, restrictive lung disease secondary to morbid obesity, sleep apnea, and pneumonia, viral illness, asthma/COPD (the patient is a tobacco smoker), less likely ACS.  The patient's vital signs are within normal limits; the initial mild tachycardia resolved after he settled down in the exam room.  He has no chest pain and is asymptomatic at this time.  Lungs are clear and he has no evidence of anasarca that I can appreciate and no evidence of peripheral edema.  I had a long talk with him about medication compliance and he is absolutely resistant to the idea of taking the medications as prescribed.  I have explained to them that there is a reason for each of them including helping his heart get more healthy and helping  push the fluid out of his body and he refuses to take any more than 1 torsemide daily.  [facility] and I am also giving him the name and number for the heart failure clinic.  His chest x-ray and EKG are reassuring.  No labs were drawn in triage so I have no lab results to review.  I discussed this with him and encouraged him to let me send lab work specifically so I can check kidney function, BNP, exercise, etc., but he is refusing or other follow-up as an outpatient.  I gave my usual customary return precautions.     ____________________________________________  FINAL CLINICAL IMPRESSION(S) / ED DIAGNOSES  Final diagnoses:  SOB (shortness of breath)     MEDICATIONS GIVEN DURING THIS VISIT:  Medications  albuterol (PROVENTIL) (2.5 MG/3ML) 0.083% nebulizer solution 5 mg (5 mg Nebulization Given 09/30/17 1608)     ED Discharge Orders    None       Note:  This document was prepared using Dragon voice recognition software and may include unintentional dictation errors.    Hinda Kehr, MD 09/30/17 1726

## 2017-09-30 NOTE — ED Notes (Signed)
E-signature pad unavailable at this time.  Patient verbalized understanding of all discharge instructions and follow up care.

## 2017-09-30 NOTE — ED Triage Notes (Signed)
Pt reports increased SOB for the past 2 days. Pt takes lasix to treat edema r/t CHF. Pt denies having gained more fluid weight. Pt had a breathing treatment at PCP a month ago that he reports helped. PT does not have a hx of asthma and reported never filling his inhaler prescription. Pt denies cough or congestion and denies chest pain at this time. No dizziness or lightheadedness.

## 2017-09-30 NOTE — Discharge Instructions (Signed)
Your workup in the Emergency Department today was reassuring.  We did not find any specific abnormalities.  It is important that you take your medications as prescribed and follow-up either with your doctor at the [place], with the heart failure clinic, or with Dr. Rockey Situ.  Please read through the included information for additional tips about managing your cardiomyopathy (heart failure).  Please also discussed the possibility of the sleep apnea with your doctor.  Return to the Emergency Department if you develop new or worsening symptoms that concern you.

## 2017-10-14 ENCOUNTER — Emergency Department: Payer: Commercial Managed Care - PPO

## 2017-10-14 ENCOUNTER — Emergency Department
Admission: EM | Admit: 2017-10-14 | Discharge: 2017-10-14 | Disposition: A | Discharge status: Home / Self Care | Payer: Commercial Managed Care - PPO | Attending: Emergency Medicine | Admitting: Emergency Medicine

## 2017-10-14 ENCOUNTER — Encounter: Payer: Self-pay | Admitting: Emergency Medicine

## 2017-10-14 ENCOUNTER — Other Ambulatory Visit: Payer: Self-pay

## 2017-10-14 DIAGNOSIS — R1032 Left lower quadrant pain: Secondary | ICD-10-CM | POA: Diagnosis present

## 2017-10-14 DIAGNOSIS — Z955 Presence of coronary angioplasty implant and graft: Secondary | ICD-10-CM | POA: Diagnosis not present

## 2017-10-14 DIAGNOSIS — Z79899 Other long term (current) drug therapy: Secondary | ICD-10-CM | POA: Diagnosis not present

## 2017-10-14 DIAGNOSIS — I5022 Chronic systolic (congestive) heart failure: Secondary | ICD-10-CM | POA: Diagnosis not present

## 2017-10-14 DIAGNOSIS — F17210 Nicotine dependence, cigarettes, uncomplicated: Secondary | ICD-10-CM | POA: Diagnosis not present

## 2017-10-14 DIAGNOSIS — Z7982 Long term (current) use of aspirin: Secondary | ICD-10-CM | POA: Insufficient documentation

## 2017-10-14 DIAGNOSIS — K529 Noninfective gastroenteritis and colitis, unspecified: Secondary | ICD-10-CM

## 2017-10-14 DIAGNOSIS — K659 Peritonitis, unspecified: Secondary | ICD-10-CM | POA: Diagnosis not present

## 2017-10-14 DIAGNOSIS — K6389 Other specified diseases of intestine: Secondary | ICD-10-CM

## 2017-10-14 LAB — URINALYSIS, COMPLETE (UACMP) WITH MICROSCOPIC
BILIRUBIN URINE: NEGATIVE
Glucose, UA: NEGATIVE mg/dL
Hgb urine dipstick: NEGATIVE
Ketones, ur: NEGATIVE mg/dL
Leukocytes, UA: NEGATIVE
NITRITE: NEGATIVE
PROTEIN: NEGATIVE mg/dL
SPECIFIC GRAVITY, URINE: 1.029 (ref 1.005–1.030)
Squamous Epithelial / LPF: NONE SEEN (ref 0–5)
pH: 5 (ref 5.0–8.0)

## 2017-10-14 LAB — COMPREHENSIVE METABOLIC PANEL
ALBUMIN: 4.1 g/dL (ref 3.5–5.0)
ALK PHOS: 76 U/L (ref 38–126)
ALT: 32 U/L (ref 0–44)
AST: 30 U/L (ref 15–41)
Anion gap: 9 (ref 5–15)
BUN: 14 mg/dL (ref 6–20)
CALCIUM: 9.6 mg/dL (ref 8.9–10.3)
CHLORIDE: 106 mmol/L (ref 98–111)
CO2: 27 mmol/L (ref 22–32)
Creatinine, Ser: 1.07 mg/dL (ref 0.61–1.24)
GFR calc Af Amer: >60 mL/min (ref >60)
GFR calc non Af Amer: >60 mL/min (ref >60)
GLUCOSE: 126 mg/dL — AB (ref 70–99)
Potassium: 3.5 mmol/L (ref 3.5–5.1)
SODIUM: 142 mmol/L (ref 135–145)
Total Bilirubin: 0.3 mg/dL (ref 0.3–1.2)
Total Protein: 7.6 g/dL (ref 6.5–8.1)

## 2017-10-14 LAB — CBC
HCT: 42.3 % (ref 40.0–52.0)
HEMOGLOBIN: 14.8 g/dL (ref 13.0–18.0)
MCH: 31.6 pg (ref 26.0–34.0)
MCHC: 34.9 g/dL (ref 32.0–36.0)
MCV: 90.4 fL (ref 80.0–100.0)
PLATELETS: 233 10*3/uL (ref 150–440)
RBC: 4.68 MIL/uL (ref 4.40–5.90)
RDW: 12.5 % (ref 11.5–14.5)
WBC: 8.7 10*3/uL (ref 3.8–10.6)

## 2017-10-14 LAB — LIPASE, BLOOD: Lipase: 57 U/L — ABNORMAL HIGH (ref 11–51)

## 2017-10-14 MED ORDER — IBUPROFEN 600 MG PO TABS
ORAL_TABLET | ORAL | Status: AC
  Filled 2017-10-14: qty 1

## 2017-10-14 MED ORDER — IBUPROFEN 400 MG PO TABS
600.0000 mg | ORAL_TABLET | Freq: Once | ORAL | Status: AC
  Administered 2017-10-14: 600 mg via ORAL

## 2017-10-14 NOTE — ED Triage Notes (Signed)
Pt presents to ED with left lower abd pain that started this morning. Denies similar pain previously. Denies urinary symptoms or constipation. Pain has increased throughout the day.

## 2017-10-14 NOTE — ED Notes (Signed)
Patient discharged to home per MD order. Patient in stable condition, and deemed medically cleared by ED provider for discharge. Discharge instructions reviewed with patient/family using "Teach Back"; verbalized understanding of medication education and administration, and information about follow-up care. Denies further concerns. ° °

## 2017-10-14 NOTE — ED Notes (Signed)
Pt in with co LLQ pain that started this am, no hx of the same. Pt denies any n.v.d. Or dysuria. Pt states pain worse when he walks, abd tender on palpation to all quadrants.

## 2017-10-14 NOTE — ED Notes (Signed)
Patient transported to CT 

## 2017-10-14 NOTE — Discharge Instructions (Addendum)
Epiploic appendages are normal outpouchings of peritoneal fat on the anti-mesenteric surface of the colon. Epiploic appendagitis is usually a benign and self-limited condition.  It happens when this outpouching gets twisted.  Take Motrin, up to 2400 total milligrams a day, 604 times a day as useful, as tolerated and directed with food for the next few days.  If you have severe pain, high fever, persistent vomiting or other concerns return to the emergency room.  Follow-up with surgery if you have recurrent episodes, and make your primary care doctor aware.

## 2017-10-14 NOTE — ED Provider Notes (Signed)
[facility] Emergency Department Provider Note  ____________________________________________   I have reviewed the triage vital signs and the nursing notes. Where available I have reviewed prior notes and, if possible and indicated, outside hospital notes.    HISTORY  Chief Complaint Abdominal Pain    HPI Carlos Brewer. is a 32 y.o. male who presents today complaining of left lower quadrant abdominal pain, nothing makes it better nothing makes it worse is been there since he woke up this morning no other alleviating or aggravating factors no radiation, no other associated symptoms, no testicular pain or swelling, no trauma, no dysuria no urinary frequency, states he is never had this before.  No other complaints it is sharp sometimes significant.  Denies other antecedent treatment    Past Medical History:  Diagnosis Date  . CHF (congestive heart failure) ([place])   . Cocaine abuse ([place])   . HFrEF (heart failure with reduced ejection fraction) ([place])    a. 10/2016 Echo: EF 15-20%, Gr2 DD, mildly dil LA/RA.  Marland Kitchen History of cardiac cath    a. 10/2016 Cath: LM nl, LAD min irregs, LCX nl, RCA nl, EF 15%.  . Morbid obesity ([place])   . Myocarditis ([place])    a. 10/2016 Admit w/ CHF and trop elevation; b. 10/2016 Echo: EF 15-20%; c. 10/2016 Cath: Min irregs in LAD otw nl cors, EF 15%, glob HK.  Marland Kitchen NICM (nonischemic cardiomyopathy) ([place])    a. 10/2016 Echo: EF 15-20%, Gr2 DD; b. 10/2016 Cath: Nl cors.  . Palpitations   . Tobacco abuse     Patient Active Problem List   Diagnosis Date Noted  . Morbid obesity ([place]) 02/07/2017  . Nonischemic cardiomyopathy ([place]) 11/07/2016  . Chronic systolic heart failure ([place]) 11/05/2016  . Anxiety 11/05/2016  . Substance use disorder 11/05/2016  . Pneumonia 10/24/2016    Past Surgical History:  Procedure Laterality Date  . CORONARY ANGIOPLASTY    . RIGHT/LEFT HEART CATH AND CORONARY ANGIOGRAPHY N/A 10/27/2016   Procedure:  RIGHT/LEFT HEART CATH AND CORONARY ANGIOGRAPHY;  Surgeon: Wellington Hampshire, MD;  Location: [place] CV LAB;  Service: Cardiovascular;  Laterality: N/A;  . WISDOM TOOTH EXTRACTION  2008    Prior to Admission medications   Medication Sig Start Date End Date Taking? Authorizing Provider  acetaminophen (TYLENOL) 500 MG tablet Take 2 tablets (1,000 mg total) by mouth every 6 (six) hours as needed. Patient taking differently: Take 1,000 mg by mouth every 6 (six) hours as needed for mild pain or fever.  10/12/16   Betancourt, Aura Fey, NP  albuterol (PROVENTIL HFA;VENTOLIN HFA) 108 (90 Base) MCG/ACT inhaler Inhale 1-2 puffs into the lungs every 6 (six) hours as needed for wheezing or shortness of breath. 08/31/17   Coral Spikes, DO  aspirin EC 81 MG EC tablet Take 1 tablet (81 mg total) by mouth daily. 10/30/16   Demetrios Loll, MD  atorvastatin (LIPITOR) 40 MG tablet Take 1 tablet (40 mg total) by mouth daily at 6 PM. 10/29/16   Demetrios Loll, MD  carvedilol (COREG) 3.125 MG tablet Take 1 tablet (3.125 mg total) by mouth 2 (two) times daily with a meal. 10/29/16   Demetrios Loll, MD  nitroGLYCERIN (NITROSTAT) 0.4 MG SL tablet Place 1 tablet (0.4 mg total) under the tongue every 5 (five) minutes as needed for chest pain. 10/29/16   Demetrios Loll, MD  predniSONE (DELTASONE) 50 MG tablet 1 tablet daily x 5 days. 08/31/17   Coral Spikes,  DO  sacubitril-valsartan (ENTRESTO) 24-26 MG Take 1 tablet by mouth 2 (two) times daily. 12/05/16   Minna Merritts, MD  sertraline (ZOLOFT) 100 MG tablet TK 1 T PO QD 11/12/16   [provider]  spironolactone (ALDACTONE) 25 MG tablet Take 25 mg by mouth daily.    [provider]  torsemide (DEMADEX) 20 MG tablet Take 2 tablets (40 mg total) by mouth 2 (two) times daily. 11/07/16   Minna Merritts, MD  traZODone (DESYREL) 50 MG tablet Take 1 tablet (50 mg total) by mouth at bedtime. 10/29/16   Demetrios Loll, MD    Allergies Patient has no known allergies.  Family  History  Problem Relation Age of Onset  . Healthy Mother   . Healthy Father     Social History Social History   Tobacco Use  . Smoking status: Current Every Day Smoker    Packs/day: 0.50    Types: Cigarettes    Last attempt to quit: 10/23/2016    Years since quitting: 0.9  . Smokeless tobacco: Never Used  Substance Use Topics  . Alcohol use: No    Comment: none  . Drug use: Not Currently    Types: Cocaine    Comment: quit 10/23/2016     Review of Systems Constitutional: No fever/chills Eyes: No visual changes. ENT: No sore throat. No stiff neck no neck pain Cardiovascular: Denies chest pain. Respiratory: Denies shortness of breath. Gastrointestinal:   no vomiting.  No diarrhea.  No constipation. Genitourinary: Negative for dysuria. Musculoskeletal: Negative lower extremity swelling Skin: Negative for rash. Neurological: Negative for severe headaches, focal weakness or numbness.   ____________________________________________   PHYSICAL EXAM:  VITAL SIGNS: ED Triage Vitals  Enc Vitals Group     BP 10/14/17 1918 127/69     Pulse Rate 10/14/17 1918 (!) 102     Resp 10/14/17 1918 16     Temp 10/14/17 1918 98.4 F (36.9 C)     Temp Source 10/14/17 1918 Oral     SpO2 10/14/17 1918 100 %     Weight 10/14/17 1920 300 lb (136.1 kg)     Height 10/14/17 1920 5\' 11"  (1.803 m)     Head Circumference --      Peak Flow --      Pain Score 10/14/17 1920 7     Pain Loc --      Pain Edu? --      Excl. in [place]? --     Constitutional: Alert and oriented. Well appearing and in no acute distress. Eyes: Conjunctivae are normal Head: Atraumatic HEENT: No congestion/rhinnorhea. Mucous membranes are moist.  Oropharynx non-erythematous Neck:   Nontender with no meningismus, no masses, no stridor Cardiovascular: Normal rate, regular rhythm. Grossly normal heart sounds.  Good peripheral circulation. Respiratory: Normal respiratory effort.  No retractions. Lungs CTAB. Abdominal: Soft  and tennis palpation left lower quadrant no guarding no rebound nonsurgical abdomen. No distentionBack:  There is no focal tenderness or step off.  there is no midline tenderness there are no lesions noted. there is no CVA tenderness GU: Declines Musculoskeletal: No lower extremity tenderness, no upper extremity tenderness. No joint effusions, no DVT signs strong distal pulses no edema Neurologic:  Normal speech and language. No gross focal neurologic deficits are appreciated.  Skin:  Skin is warm, dry and intact. No rash noted. Psychiatric: Mood and affect are normal. Speech and behavior are normal.  ____________________________________________   LABS (all labs ordered are listed, but only abnormal  results are displayed)  Labs Reviewed  URINALYSIS, COMPLETE (UACMP) WITH MICROSCOPIC - Abnormal; Notable for the following components:      Result Value   Color, Urine YELLOW (*)    APPearance CLEAR (*)    Bacteria, UA RARE (*)    All other components within normal limits  COMPREHENSIVE METABOLIC PANEL - Abnormal; Notable for the following components:   Glucose, Bld 126 (*)    All other components within normal limits  LIPASE, BLOOD - Abnormal; Notable for the following components:   Lipase 57 (*)    All other components within normal limits  CBC    Pertinent labs  results that were available during my care of the patient were reviewed by me and considered in my medical decision making (see chart for details). ____________________________________________  EKG  I personally interpreted any EKGs ordered by me or triage  ____________________________________________  RADIOLOGY  Pertinent labs & imaging results that were available during my care of the patient were reviewed by me and considered in my medical decision making (see chart for details). If possible, patient and/or family made aware of any abnormal findings.  Ct Renal Stone Study  Result Date: 10/14/2017 CLINICAL DATA:   Left flank pain, onset today. Stone disease suspected. EXAM: CT ABDOMEN AND PELVIS WITHOUT CONTRAST TECHNIQUE: Multidetector CT imaging of the abdomen and pelvis was performed following the standard protocol without IV contrast. COMPARISON:  None. FINDINGS: Lower chest: Mild hypoventilatory atelectasis, partially motion obscured. Borderline cardiomegaly, particularly for age. Hepatobiliary: The liver is enlarged spanning 22 cm in cranial caudal. Diffusely decreased density consistent with steatosis, mild focal fatty sparing adjacent gallbladder fossa. Gallbladder physiologically distended, no calcified stone. No biliary dilatation. Pancreas: No ductal dilatation or inflammation. Spleen: Normal in size without focal abnormality. Adrenals/Urinary Tract: Normal adrenal glands. The kidneys are symmetric in size without stones or hydronephrosis. There is no perinephric stranding. Both ureters are decompressed without stones along the course. Urinary bladder is partially distended without stone. Stomach/Bowel: Inflamed fat lobule adjacent to the anti mesenteric border of the sigmoid/descending colon junction with mild fat stranding consistent with acute epiploic appendagitis. No associated bowel wall thickening. Stomach, small bowel, colon, and appendix are otherwise normal. Vascular/Lymphatic: Normal course and caliber of noncontrast abdominopelvic vasculature. Small pelvic lymph nodes not enlarged by size criteria. Reproductive: Prostate is unremarkable. Other: No free air, free fluid, or intra-abdominal fluid collection. Minimal fat in the inguinal canals. Small fat containing umbilical hernia. Musculoskeletal: There are no acute or suspicious osseous abnormalities. IMPRESSION: 1. Acute epiploic appendagitis arising from the descending/sigmoid colon junction in the left lower quadrant. 2.  No renal stones or obstructive uropathy. 3. Hepatomegaly and hepatic steatosis. Electronically Signed   By: Jeb Levering M.D.    On: 10/14/2017 21:45   ____________________________________________    PROCEDURES  Procedure(s) performed: None  Procedures  Critical Care performed: None  ____________________________________________   INITIAL IMPRESSION / ASSESSMENT AND PLAN / ED COURSE  Pertinent labs & imaging results that were available during my care of the patient were reviewed by me and considered in my medical decision making (see chart for details).  Patient no acute distress, has left lower quadrant pain since this morning, CT scan done to rule out kidney stone or diverticular disease is most likely entities, however, patient has epiploic appendagitis which certainly fits the clinical picture we will start her on nonsteroidals will refer him as needed to surgery, return precautions but given understood patient explained all of the findings and  he is very comfort with the plan.  He is in no acute distress, and happy to take Motrin as pain medication for this.    ____________________________________________   FINAL CLINICAL IMPRESSION(S) / ED DIAGNOSES  Final diagnoses:  None      This chart was dictated using voice recognition software.  Despite best efforts to proofread,  errors can occur which can change meaning.      Schuyler Amor, MD 10/14/17 2204

## 2017-10-14 NOTE — ED Notes (Signed)
MD at bedside. 

## 2017-10-15 ENCOUNTER — Telehealth: Payer: Self-pay | Admitting: Family

## 2017-10-15 ENCOUNTER — Ambulatory Visit: Payer: Commercial Managed Care - PPO | Admitting: Family

## 2017-10-15 NOTE — Telephone Encounter (Signed)
Patient did not show for his [facility] appointment on 10/15/17. Will attempt to reschedule.

## 2018-01-08 ENCOUNTER — Encounter: Payer: Self-pay | Admitting: Emergency Medicine

## 2018-01-08 ENCOUNTER — Emergency Department: Payer: Commercial Managed Care - PPO

## 2018-01-08 ENCOUNTER — Other Ambulatory Visit: Payer: Self-pay

## 2018-01-08 ENCOUNTER — Emergency Department
Admission: EM | Admit: 2018-01-08 | Discharge: 2018-01-08 | Disposition: A | Discharge status: Home / Self Care | Payer: Commercial Managed Care - PPO | Attending: Emergency Medicine | Admitting: Emergency Medicine

## 2018-01-08 DIAGNOSIS — F17210 Nicotine dependence, cigarettes, uncomplicated: Secondary | ICD-10-CM | POA: Diagnosis not present

## 2018-01-08 DIAGNOSIS — R0602 Shortness of breath: Secondary | ICD-10-CM | POA: Diagnosis present

## 2018-01-08 DIAGNOSIS — Z7982 Long term (current) use of aspirin: Secondary | ICD-10-CM | POA: Insufficient documentation

## 2018-01-08 DIAGNOSIS — I5023 Acute on chronic systolic (congestive) heart failure: Secondary | ICD-10-CM | POA: Insufficient documentation

## 2018-01-08 DIAGNOSIS — I509 Heart failure, unspecified: Secondary | ICD-10-CM

## 2018-01-08 DIAGNOSIS — Z79899 Other long term (current) drug therapy: Secondary | ICD-10-CM | POA: Insufficient documentation

## 2018-01-08 LAB — COMPREHENSIVE METABOLIC PANEL
ALBUMIN: 3.7 g/dL (ref 3.5–5.0)
ALK PHOS: 99 U/L (ref 38–126)
ALT: 96 U/L — ABNORMAL HIGH (ref 0–44)
ANION GAP: 6 (ref 5–15)
AST: 55 U/L — ABNORMAL HIGH (ref 15–41)
BILIRUBIN TOTAL: 0.7 mg/dL (ref 0.3–1.2)
BUN: 19 mg/dL (ref 6–20)
CALCIUM: 9 mg/dL (ref 8.9–10.3)
CO2: 25 mmol/L (ref 22–32)
Chloride: 109 mmol/L (ref 98–111)
Creatinine, Ser: 1.14 mg/dL (ref 0.61–1.24)
GFR calc Af Amer: >60 mL/min (ref >60)
GFR calc non Af Amer: >60 mL/min (ref >60)
Glucose, Bld: 135 mg/dL — ABNORMAL HIGH (ref 70–99)
POTASSIUM: 3.8 mmol/L (ref 3.5–5.1)
Sodium: 140 mmol/L (ref 135–145)
TOTAL PROTEIN: 6.9 g/dL (ref 6.5–8.1)

## 2018-01-08 LAB — CBC
HEMATOCRIT: 39.8 % (ref 39.0–52.0)
HEMOGLOBIN: 13.2 g/dL (ref 13.0–17.0)
MCH: 30.9 pg (ref 26.0–34.0)
MCHC: 33.2 g/dL (ref 30.0–36.0)
MCV: 93.2 fL (ref 80.0–100.0)
Platelets: 244 10*3/uL (ref 150–400)
RBC: 4.27 MIL/uL (ref 4.22–5.81)
RDW: 13 % (ref 11.5–15.5)
WBC: 6.5 10*3/uL (ref 4.0–10.5)
nRBC: 0 % (ref 0.0–0.2)

## 2018-01-08 LAB — TROPONIN I: TROPONIN I: 0.03 ng/mL — AB (ref <0.03)

## 2018-01-08 LAB — BRAIN NATRIURETIC PEPTIDE: B Natriuretic Peptide: 713 pg/mL — ABNORMAL HIGH (ref 0.0–100.0)

## 2018-01-08 MED ORDER — POTASSIUM CHLORIDE CRYS ER 20 MEQ PO TBCR
40.0000 meq | EXTENDED_RELEASE_TABLET | Freq: Once | ORAL | Status: AC
  Administered 2018-01-08: 40 meq via ORAL
  Filled 2018-01-08: qty 2

## 2018-01-08 MED ORDER — FUROSEMIDE 10 MG/ML IJ SOLN
60.0000 mg | Freq: Once | INTRAMUSCULAR | Status: AC
  Administered 2018-01-08: 60 mg via INTRAVENOUS
  Filled 2018-01-08: qty 8

## 2018-01-08 NOTE — ED Triage Notes (Signed)
C/O difficulty breathing and SOB x 3-4 days.  States had been coughing up gastric content a few days ago, but nothing since then.  Uses CPAP at night, which helps symptoms.  States breathing "worsens" when taking CPAP off.

## 2018-01-08 NOTE — ED Notes (Signed)
Pt reports he feels better. Pt's breathing remains unlabored regular breathing

## 2018-01-08 NOTE — ED Provider Notes (Signed)
[facility] Emergency Department Provider Note  ____________________________________________   First MD Initiated Contact with Patient 01/08/18 1059     (approximate)  I have reviewed the triage vital signs and the nursing notes.   HISTORY  Chief Complaint Shortness of breath   HPI Carlos Brewer. is a 32 y.o. male here for evaluation for shortness of breath  Reports 3 to 4 days increasing shortness of breath especially with walking and exerting himself.  Also reports it is noticeable when he lays down at night but he is using his home CPAP for sleep apnea that seems to provide him "more oxygen" and he feels better while wearing that.  Denies leg swelling.  No nausea vomiting.  No chest pain.  He has been coughing up a sort of strange yellow we will like sputum at times.  Does not feel infected though does not think he would have pneumonia  Patient does report that he occasionally takes his torsemide but not often, yesterday he did take 1 pill he reports he thinks it helped a little bit as he was able to urinate more shortness of breath seemed just a little better     Past Medical History:  Diagnosis Date  . CHF (congestive heart failure) ([place])   . Cocaine abuse ([place])   . HFrEF (heart failure with reduced ejection fraction) ([place])    a. 10/2016 Echo: EF 15-20%, Gr2 DD, mildly dil LA/RA.  Marland Kitchen History of cardiac cath    a. 10/2016 Cath: LM nl, LAD min irregs, LCX nl, RCA nl, EF 15%.  . Morbid obesity ([place])   . Myocarditis ([place])    a. 10/2016 Admit w/ CHF and trop elevation; b. 10/2016 Echo: EF 15-20%; c. 10/2016 Cath: Min irregs in LAD otw nl cors, EF 15%, glob HK.  Marland Kitchen NICM (nonischemic cardiomyopathy) ([place])    a. 10/2016 Echo: EF 15-20%, Gr2 DD; b. 10/2016 Cath: Nl cors.  . Palpitations   . Tobacco abuse     Patient Active Problem List   Diagnosis Date Noted  . Morbid obesity ([place]) 02/07/2017  . Nonischemic cardiomyopathy ([place]) 11/07/2016  . Chronic  systolic heart failure ([place]) 11/05/2016  . Anxiety 11/05/2016  . Substance use disorder 11/05/2016  . Pneumonia 10/24/2016    Past Surgical History:  Procedure Laterality Date  . CORONARY ANGIOPLASTY    . RIGHT/LEFT HEART CATH AND CORONARY ANGIOGRAPHY N/A 10/27/2016   Procedure: RIGHT/LEFT HEART CATH AND CORONARY ANGIOGRAPHY;  Surgeon: Wellington Hampshire, MD;  Location: [place] CV LAB;  Service: Cardiovascular;  Laterality: N/A;  . WISDOM TOOTH EXTRACTION  2008    Prior to Admission medications   Medication Sig Start Date End Date Taking? Authorizing Provider  acetaminophen (TYLENOL) 500 MG tablet Take 2 tablets (1,000 mg total) by mouth every 6 (six) hours as needed. Patient taking differently: Take 1,000 mg by mouth every 6 (six) hours as needed for mild pain or fever.  10/12/16   Betancourt, Aura Fey, NP  albuterol (PROVENTIL HFA;VENTOLIN HFA) 108 (90 Base) MCG/ACT inhaler Inhale 1-2 puffs into the lungs every 6 (six) hours as needed for wheezing or shortness of breath. 08/31/17   Coral Spikes, DO  aspirin EC 81 MG EC tablet Take 1 tablet (81 mg total) by mouth daily. 10/30/16   Demetrios Loll, MD  atorvastatin (LIPITOR) 40 MG tablet Take 1 tablet (40 mg total) by mouth daily at 6 PM. 10/29/16   Demetrios Loll, MD  carvedilol (COREG) 3.125  MG tablet Take 1 tablet (3.125 mg total) by mouth 2 (two) times daily with a meal. 10/29/16   Demetrios Loll, MD  nitroGLYCERIN (NITROSTAT) 0.4 MG SL tablet Place 1 tablet (0.4 mg total) under the tongue every 5 (five) minutes as needed for chest pain. 10/29/16   Demetrios Loll, MD  predniSONE (DELTASONE) 50 MG tablet 1 tablet daily x 5 days. 08/31/17   Cook, Barnie Del, DO  sacubitril-valsartan (ENTRESTO) 24-26 MG Take 1 tablet by mouth 2 (two) times daily. 12/05/16   Minna Merritts, MD  sertraline (ZOLOFT) 100 MG tablet TK 1 T PO QD 11/12/16   [provider]  spironolactone (ALDACTONE) 25 MG tablet Take 25 mg by mouth daily.    [provider]    torsemide (DEMADEX) 20 MG tablet Take 2 tablets (40 mg total) by mouth 2 (two) times daily. 11/07/16   Minna Merritts, MD  traZODone (DESYREL) 50 MG tablet Take 1 tablet (50 mg total) by mouth at bedtime. 10/29/16   Demetrios Loll, MD    Allergies Patient has no known allergies.  Family History  Problem Relation Age of Onset  . Healthy Mother   . Healthy Father     Social History Social History   Tobacco Use  . Smoking status: Current Every Day Smoker    Packs/day: 0.50    Types: Cigarettes    Last attempt to quit: 10/23/2016    Years since quitting: 1.2  . Smokeless tobacco: Never Used  Substance Use Topics  . Alcohol use: No    Comment: none  . Drug use: Not Currently    Types: Cocaine    Comment: quit 10/23/2016     Review of Systems Constitutional: No fever/chills Eyes: No visual changes. ENT: No sore throat. Cardiovascular: Denies chest pain. Respiratory: See HPI Gastrointestinal: No abdominal pain.   Genitourinary: Negative for dysuria. Musculoskeletal: Negative for back pain. Skin: Negative for rash. Neurological: Negative for headaches, areas of focal weakness or numbness.    ____________________________________________   PHYSICAL EXAM:  VITAL SIGNS: ED Triage Vitals  Enc Vitals Group     BP 01/08/18 1022 127/90     Pulse Rate 01/08/18 1022 (!) 145     Resp 01/08/18 1022 18     Temp 01/08/18 1022 97.8 F (36.6 C)     Temp Source 01/08/18 1022 Oral     SpO2 01/08/18 1022 92 %     Weight 01/08/18 1023 300 lb (136.1 kg)     Height 01/08/18 1023 6' (1.829 m)     Head Circumference --      Peak Flow --      Pain Score 01/08/18 1023 0     Pain Loc --      Pain Edu? --      Excl. in [place]? --    Constitutional: Alert and oriented. Well appearing and in no acute distress. Eyes: Conjunctivae are normal. Head: Atraumatic. Nose: No congestion/rhinnorhea. Mouth/Throat: Mucous membranes are moist. Neck: No stridor.  Cardiovascular: Normal rate, regular  rhythm. Grossly normal heart sounds.  Good peripheral circulation. Respiratory: Normal respiratory effort for slightly elevated heart rate.  No retractions.  There are faint crackles in the lower bases bilaterally.  Normal oxygen saturation while sitting up on room air. Gastrointestinal: Soft and nontender. No distention. Musculoskeletal: No lower extremity tenderness nor edema. Neurologic:  Normal speech and language. No gross focal neurologic deficits are appreciated.  Skin:  Skin is warm, dry and intact. No rash noted.  Psychiatric: Mood and affect are normal. Speech and behavior are normal.  ____________________________________________   LABS (all labs ordered are listed, but only abnormal results are displayed)  Labs Reviewed  COMPREHENSIVE METABOLIC PANEL - Abnormal; Notable for the following components:      Result Value   Glucose, Bld 135 (*)    AST 55 (*)    ALT 96 (*)    All other components within normal limits  TROPONIN I - Abnormal; Notable for the following components:   Troponin I 0.03 (*)    All other components within normal limits  BRAIN NATRIURETIC PEPTIDE - Abnormal; Notable for the following components:   B Natriuretic Peptide 713.0 (*)    All other components within normal limits  CBC   ____________________________________________  EKG  Reviewed and entered by me at 1125 Heart rate 95 QRS 110 QTc 430 Normal sinus rhythm, somewhat left bundle branch block like morphology, but no evidence of acute ischemia denoted. ____________________________________________  RADIOLOGY  Dg Chest Port 1 View  Result Date: 01/08/2018 CLINICAL DATA:  Difficulty breathing 3-4 days with coughing and vomiting. EXAM: PORTABLE CHEST 1 VIEW COMPARISON:  09/30/2017 and 08/31/2017 FINDINGS: Lordotic technique demonstrated. Lungs are adequately inflated without focal airspace consolidation or effusion. Mild hazy prominence of the perihilar markings which may be due to mild vascular  congestion. Borderline cardiomegaly. Remainder of the exam is unchanged. IMPRESSION: Borderline cardiomegaly with possible mild vascular congestion. Electronically Signed   By: Marin Olp M.D.   On: 01/08/2018 11:28    Chest x-ray reviewed, mild vascular congestion noted.  No notation of pulmonary edema, I reviewed the x-ray myself I do not see any evidence of acute edema. ____________________________________________   PROCEDURES  Procedure(s) performed: None  Procedures  Critical Care performed: No  ____________________________________________   INITIAL IMPRESSION / ASSESSMENT AND PLAN / ED COURSE  Pertinent labs & imaging results that were available during my care of the patient were reviewed by me and considered in my medical decision making (see chart for details).   Patient returns for dyspnea.  He also is experiencing orthopnea, has bilateral faint rales in the lower bases bilateral.  No fever no elevated white count no infectious symptoms.  I suspect he has at least mild to moderate volume overload, vascular congestion.  He was quite tachycardic on arrival but at rest his heart rate normalizes and oxygen level is normal.  No evidence of acute cardiac disease, previous history of slightly elevated troponins and known cardiomyopathy.   No pleuritic pain.  No chest pain at all.  ----------------------------------------- 11:36 AM on 01/08/2018 -----------------------------------------  Discussed with patient plan of care, will diurese with Lasix.  The patient reports that he strongly desires to be able to go to work at 3 PM today, I offered him a work note and also further observation in the hospital but he reports that he strongly desires to be able to be discharged and does not wish to stay.  He understands that offered to him, will trial a dose of Lasix evaluate for diuresis and reassess his symptoms and this is clear goal is to be discharged and really wishes to be at work  today at 3 PM.    Clinical Course as of Jan 09 1304  Fri Jan 08, 2018  1253 Patient is diuresed 1700 mL's at this point, he reports he is feeling much better and his lungs feel much better.  Patient alert and oriented, no distress with normal vital signs.  Alert  and oriented, appears appropriate for ongoing close outpatient follow-up in reports she will call to schedule appointment Monday with cardiology.  Return precautions and treatment recommendations and follow-up discussed with the patient who is agreeable with the plan.    [MQ]    Clinical Course User Index [MQ] Delman Kitten, MD   ----------------------------------------- 1:05 PM on 01/08/2018 -----------------------------------------  Return precautions and treatment recommendations and follow-up discussed with the patient who is agreeable with the plan.  Discussed medication compliance, importance of taking his torsemide as prescribed.  Patient agreeable.  ____________________________________________   FINAL CLINICAL IMPRESSION(S) / ED DIAGNOSES  Final diagnoses:  Acute on chronic congestive heart failure, unspecified heart failure type [facility])        Note:  This document was prepared using Dragon voice recognition software and may include unintentional dictation errors       Delman Kitten, MD 01/08/18 1305

## 2018-01-20 ENCOUNTER — Encounter: Payer: Self-pay | Admitting: Emergency Medicine

## 2018-01-20 ENCOUNTER — Emergency Department: Payer: Commercial Managed Care - PPO

## 2018-01-20 ENCOUNTER — Other Ambulatory Visit: Payer: Self-pay

## 2018-01-20 ENCOUNTER — Emergency Department
Admission: EM | Admit: 2018-01-20 | Discharge: 2018-01-20 | Disposition: A | Discharge status: Home / Self Care | Payer: Commercial Managed Care - PPO | Attending: Emergency Medicine | Admitting: Emergency Medicine

## 2018-01-20 DIAGNOSIS — S6992XA Unspecified injury of left wrist, hand and finger(s), initial encounter: Secondary | ICD-10-CM | POA: Diagnosis present

## 2018-01-20 DIAGNOSIS — Y929 Unspecified place or not applicable: Secondary | ICD-10-CM | POA: Insufficient documentation

## 2018-01-20 DIAGNOSIS — Y9389 Activity, other specified: Secondary | ICD-10-CM | POA: Diagnosis not present

## 2018-01-20 DIAGNOSIS — W2209XA Striking against other stationary object, initial encounter: Secondary | ICD-10-CM | POA: Diagnosis not present

## 2018-01-20 DIAGNOSIS — S60222A Contusion of left hand, initial encounter: Secondary | ICD-10-CM | POA: Diagnosis not present

## 2018-01-20 DIAGNOSIS — Y999 Unspecified external cause status: Secondary | ICD-10-CM | POA: Diagnosis not present

## 2018-01-20 MED ORDER — SERTRALINE HCL 50 MG PO TABS
ORAL_TABLET | ORAL | Status: AC
  Filled 2018-01-20: qty 4

## 2018-01-20 MED ORDER — SERTRALINE HCL 50 MG PO TABS
200.0000 mg | ORAL_TABLET | Freq: Every day | ORAL | Status: DC
  Administered 2018-01-20: 200 mg via ORAL

## 2018-01-20 NOTE — ED Triage Notes (Signed)
Patient ambulatory to triage with steady gait, without difficulty or distress noted; pt reports injuring left hand by "hitting it on refrigerator"; abrasion noted between 4th & 5th digit

## 2018-01-20 NOTE — ED Provider Notes (Signed)
[facility] Emergency Department Provider Note _________________________   First MD Initiated Contact with Patient 01/20/18 [phone]     (approximate)  I have reviewed the triage vital signs and the nursing notes.   HISTORY  Chief Complaint Hand Injury    HPI Parish Dubose is a 32 y.o. male presents to the emergency department with left hand fourth and fifth finger pain currently 4 out of 10 after punching a refrigerator.  Patient states that pain is worse with any movement of the hand.  Patient also states that he ran out of his Zoloft and is waiting for medication to arrive in the mail.  Patient requesting tablet of Zoloft at this time.  Patient denies any SI or HI.  Past medical history Anxiety disorder There are no active problems to display for this patient.   Past surgical history None  Prior to Admission medications   Not on File    Allergies No known drug allergies No family history on file.  Social History Social History   Tobacco Use  . Smoking status: Not on file  Substance Use Topics  . Alcohol use: Not on file  . Drug use: Not on file    Review of Systems Constitutional: No fever/chills Eyes: No visual changes. ENT: No sore throat. Cardiovascular: Denies chest pain. Respiratory: Denies shortness of breath. Gastrointestinal: No abdominal pain.  No nausea, no vomiting.  No diarrhea.  No constipation. Genitourinary: Negative for dysuria. Musculoskeletal: Negative for neck pain.  Negative for back pain.  Positive for left hand pain Integumentary: Negative for rash. Neurological: Negative for headaches, focal weakness or numbness.  ____________________________________________   PHYSICAL EXAM:  VITAL SIGNS: ED Triage Vitals  Enc Vitals Group     BP 01/20/18 0244 115/70     Pulse Rate 01/20/18 0244 90     Resp 01/20/18 0244 20     Temp 01/20/18 0244 98 F (36.7 C)     Temp Source 01/20/18 0244 Oral     SpO2 01/20/18  0244 97 %     Weight 01/20/18 0243 127 kg (280 lb)     Height 01/20/18 0243 1.829 m (6')     Head Circumference --      Peak Flow --      Pain Score 01/20/18 0243 6     Pain Loc --      Pain Edu? --      Excl. in [place]? --     Constitutional: Alert and oriented. Well appearing and in no acute distress. Eyes: Conjunctivae are normal. Head: Atraumatic. Mouth/Throat: Mucous membranes are moist.  Oropharynx non-erythematous Neck: No stridor.   Cardiovascular: Normal rate, regular rhythm. Good peripheral circulation. Grossly normal heart sounds. Respiratory: Normal respiratory effort.  No retractions. Lungs CTAB. Musculoskeletal: Abrasion noted over the left fourth and fifth knuckle.Marland Kitchen Neurologic:  Normal speech and language. No gross focal neurologic deficits are appreciated.  Skin:  Skin is warm, dry and intact. No rash noted. Psychiatric: Mood and affect are normal. Speech and behavior are normal.  ___________________________________________  RADIOLOGY I, Mingo N BROWN, personally viewed and evaluated these images (plain radiographs) as part of my medical decision making, as well as reviewing the written report by the radiologist.  ED MD interpretation: Left hand x-ray reveals soft tissue swelling however no fracture or dislocation.  Official radiology report(s): Dg Hand Complete Left  Result Date: 01/20/2018 CLINICAL DATA:  Left hand injury with abrasions between the fourth and fifth digits. EXAM: LEFT HAND -  COMPLETE 3+ VIEW COMPARISON:  None. FINDINGS: Soft tissue swelling over the dorsum of the left hand. No underlying fractures identified. No focal bone lesion or bone destruction. Bone cortex appears intact. No radiopaque soft tissue foreign bodies. IMPRESSION: Soft tissue swelling. No acute bony abnormalities. Electronically Signed   By: Lucienne Capers M.D.   On: 01/20/2018 03:30      Procedures   ____________________________________________   INITIAL IMPRESSION /  ASSESSMENT AND PLAN / ED COURSE  As part of my medical decision making, I reviewed the following data within the electronic MEDICAL RECORD NUMBER  32 year old male presenting with left hand injury after punching a refrigerator.  No fracture or dislocation noted on x-ray.  Patient states injury occurred secondary to verbal disagreement with his girlfriend.  Patient given 200 mg tablet of Zoloft in the emergency department. ____________________________________________  FINAL CLINICAL IMPRESSION(S) / ED DIAGNOSES  Final diagnoses:  Contusion of left hand, initial encounter     MEDICATIONS GIVEN DURING THIS VISIT:  Medications  sertraline (ZOLOFT) tablet 200 mg (200 mg Oral Given 01/20/18 3295)     ED Discharge Orders    None       Note:  This document was prepared using Dragon voice recognition software and may include unintentional dictation errors.    Gregor Hams, MD 01/20/18 2200

## 2018-02-04 ENCOUNTER — Emergency Department
Admission: EM | Admit: 2018-02-04 | Discharge: 2018-02-04 | Disposition: A | Discharge status: Home / Self Care | Payer: Commercial Managed Care - PPO | Attending: Emergency Medicine | Admitting: Emergency Medicine

## 2018-02-04 ENCOUNTER — Emergency Department: Payer: Commercial Managed Care - PPO

## 2018-02-04 ENCOUNTER — Other Ambulatory Visit: Payer: Self-pay

## 2018-02-04 ENCOUNTER — Encounter: Payer: Self-pay | Admitting: Emergency Medicine

## 2018-02-04 DIAGNOSIS — I5023 Acute on chronic systolic (congestive) heart failure: Secondary | ICD-10-CM | POA: Diagnosis not present

## 2018-02-04 DIAGNOSIS — F17210 Nicotine dependence, cigarettes, uncomplicated: Secondary | ICD-10-CM | POA: Insufficient documentation

## 2018-02-04 DIAGNOSIS — R0602 Shortness of breath: Secondary | ICD-10-CM | POA: Diagnosis present

## 2018-02-04 DIAGNOSIS — K852 Alcohol induced acute pancreatitis without necrosis or infection: Secondary | ICD-10-CM | POA: Insufficient documentation

## 2018-02-04 DIAGNOSIS — Z955 Presence of coronary angioplasty implant and graft: Secondary | ICD-10-CM | POA: Diagnosis not present

## 2018-02-04 LAB — BASIC METABOLIC PANEL
Anion gap: 7 (ref 5–15)
BUN: 15 mg/dL (ref 6–20)
CHLORIDE: 103 mmol/L (ref 98–111)
CO2: 30 mmol/L (ref 22–32)
Calcium: 8.7 mg/dL — ABNORMAL LOW (ref 8.9–10.3)
Creatinine, Ser: 1.25 mg/dL — ABNORMAL HIGH (ref 0.61–1.24)
GFR calc non Af Amer: >60 mL/min (ref >60)
Glucose, Bld: 155 mg/dL — ABNORMAL HIGH (ref 70–99)
POTASSIUM: 3.5 mmol/L (ref 3.5–5.1)
Sodium: 140 mmol/L (ref 135–145)

## 2018-02-04 LAB — CBC
HEMATOCRIT: 39.8 % (ref 39.0–52.0)
HEMOGLOBIN: 13.2 g/dL (ref 13.0–17.0)
MCH: 30.8 pg (ref 26.0–34.0)
MCHC: 33.2 g/dL (ref 30.0–36.0)
MCV: 93 fL (ref 80.0–100.0)
NRBC: 0 % (ref 0.0–0.2)
Platelets: 230 10*3/uL (ref 150–400)
RBC: 4.28 MIL/uL (ref 4.22–5.81)
RDW: 12.3 % (ref 11.5–15.5)
WBC: 7.6 10*3/uL (ref 4.0–10.5)

## 2018-02-04 LAB — HEPATIC FUNCTION PANEL
ALBUMIN: 3.6 g/dL (ref 3.5–5.0)
ALT: 46 U/L — ABNORMAL HIGH (ref 0–44)
AST: 38 U/L (ref 15–41)
Alkaline Phosphatase: 78 U/L (ref 38–126)
BILIRUBIN DIRECT: 0.1 mg/dL (ref 0.0–0.2)
Indirect Bilirubin: 0.4 mg/dL (ref 0.3–0.9)
TOTAL PROTEIN: 6.6 g/dL (ref 6.5–8.1)
Total Bilirubin: 0.5 mg/dL (ref 0.3–1.2)

## 2018-02-04 LAB — URINE DRUG SCREEN, QUALITATIVE (ARMC ONLY)
AMPHETAMINES, UR SCREEN: NOT DETECTED
Barbiturates, Ur Screen: NOT DETECTED
Benzodiazepine, Ur Scrn: NOT DETECTED
CANNABINOID 50 NG, UR ~~LOC~~: NOT DETECTED
Cocaine Metabolite,Ur ~~LOC~~: NOT DETECTED
MDMA (ECSTASY) UR SCREEN: NOT DETECTED
METHADONE SCREEN, URINE: NOT DETECTED
Opiate, Ur Screen: NOT DETECTED
Phencyclidine (PCP) Ur S: NOT DETECTED
TRICYCLIC, UR SCREEN: NOT DETECTED

## 2018-02-04 LAB — BRAIN NATRIURETIC PEPTIDE: B NATRIURETIC PEPTIDE 5: 760 pg/mL — AB (ref 0.0–100.0)

## 2018-02-04 LAB — ETHANOL

## 2018-02-04 LAB — LIPASE, BLOOD: LIPASE: 69 U/L — AB (ref 11–51)

## 2018-02-04 LAB — TROPONIN I: Troponin I: 0.03 ng/mL (ref <0.03)

## 2018-02-04 MED ORDER — OXYCODONE-ACETAMINOPHEN 5-325 MG PO TABS: 1.0000 | ORAL_TABLET | Freq: Three times a day (TID) | ORAL | 0 refills | Status: DC | PRN

## 2018-02-04 MED ORDER — HYDROMORPHONE HCL 1 MG/ML IJ SOLN
0.5000 mg | Freq: Once | INTRAMUSCULAR | Status: AC
  Administered 2018-02-04: 0.5 mg via INTRAVENOUS
  Filled 2018-02-04: qty 1

## 2018-02-04 MED ORDER — FAMOTIDINE 20 MG PO TABS: 20.0000 mg | ORAL_TABLET | Freq: Two times a day (BID) | ORAL | 1 refills | Status: DC

## 2018-02-04 MED ORDER — FUROSEMIDE 10 MG/ML IJ SOLN
80.0000 mg | Freq: Once | INTRAMUSCULAR | Status: AC
  Administered 2018-02-04: 80 mg via INTRAVENOUS
  Filled 2018-02-04: qty 8

## 2018-02-04 NOTE — ED Notes (Signed)
500 ml urine output in urinal.

## 2018-02-04 NOTE — ED Triage Notes (Signed)
Pt reports since Friday he has had intermittent heavy chest pain to his mid epigastric area that radiates around. Pt reports when he walks he gets short of breath. Pt does admit to "partying hard" this weekend but states he only drank a lot and did not use drugs. Pt was seen at the [facility] yesterday with no relief.

## 2018-02-04 NOTE — ED Notes (Signed)
Pt oxygen saturation dropped to 85% RA while sleeping.  Pt easily aroused and encouraged to take deep breaths and oxygen increased to 98% RA.  Pt tired after pain medication so placed on 2L  at this time.

## 2018-02-04 NOTE — ED Notes (Signed)
Date and time results received: 02/04/18 1150   Test: Troponin Critical Value: 0.03 ng/mL  Name of Provider Notified: Dr. Jimmye Norman

## 2018-02-04 NOTE — ED Notes (Signed)
Mother here to take patient home. Patient requesting change in lasix and sertraline. Advised patient doctor in with critical patient and would have to wait 15 minutes. Patient states he couldn't wait and wanted to leave. Patient ambulated out of ED with steady gait in no distress.

## 2018-02-04 NOTE — ED Notes (Signed)
Pt received IV narcotics and will remain in ER until 1601 for med hold as he does not have a ride home. Pt moved to hall bed.

## 2018-02-04 NOTE — ED Provider Notes (Signed)
[facility] Emergency Department Provider Note       Time seen: ----------------------------------------- 9:13 AM on 02/04/2018 -----------------------------------------   I have reviewed the triage vital signs and the nursing notes.  HISTORY   Chief Complaint Chest Pain and Shortness of Breath    HPI Carlos Londo. is a 32 y.o. male with a history of CHF, cocaine abuse, morbid obesity, myocarditis who presents for intermittent heavy chest pain to his mid epigastric area that radiates around.  Patient states when he walks he gets short of breath.  He admits to partying hard this weekend but states he only drank alcohol and does not use any drugs.  Patient was seen at the [facility] yesterday but did not have any relief.    Past Medical History:  Diagnosis Date  . CHF (congestive heart failure) ([place])   . Cocaine abuse ([place])   . HFrEF (heart failure with reduced ejection fraction) ([place])    a. 10/2016 Echo: EF 15-20%, Gr2 DD, mildly dil LA/RA.  Marland Kitchen History of cardiac cath    a. 10/2016 Cath: LM nl, LAD min irregs, LCX nl, RCA nl, EF 15%.  . Morbid obesity ([place])   . Myocarditis ([place])    a. 10/2016 Admit w/ CHF and trop elevation; b. 10/2016 Echo: EF 15-20%; c. 10/2016 Cath: Min irregs in LAD otw nl cors, EF 15%, glob HK.  Marland Kitchen NICM (nonischemic cardiomyopathy) ([place])    a. 10/2016 Echo: EF 15-20%, Gr2 DD; b. 10/2016 Cath: Nl cors.  . Palpitations   . Tobacco abuse     Patient Active Problem List   Diagnosis Date Noted  . Morbid obesity ([place]) 02/07/2017  . Nonischemic cardiomyopathy ([place]) 11/07/2016  . Chronic systolic heart failure ([place]) 11/05/2016  . Anxiety 11/05/2016  . Substance use disorder 11/05/2016  . Pneumonia 10/24/2016    Past Surgical History:  Procedure Laterality Date  . CORONARY ANGIOPLASTY    . RIGHT/LEFT HEART CATH AND CORONARY ANGIOGRAPHY N/A 10/27/2016   Procedure: RIGHT/LEFT HEART CATH AND CORONARY ANGIOGRAPHY;  Surgeon: Wellington Hampshire, MD;  Location: [place] [facility];  Service: Cardiovascular;  Laterality: N/A;  . WISDOM TOOTH EXTRACTION  2008    Allergies Patient has no known allergies.  Social History Social History   Tobacco Use  . Smoking status: Current Every Day Smoker    Packs/day: 0.50    Types: Cigarettes    Last attempt to quit: 10/23/2016    Years since quitting: 1.2  Substance Use Topics  . Alcohol use: No    Comment: none  . Drug use: Not Currently    Types: Cocaine    Comment: quit 10/23/2016    Review of Systems Constitutional: Negative for fever. Cardiovascular: Positive for chest pain Respiratory: Positive for shortness of breath Gastrointestinal: Negative for abdominal pain, vomiting and diarrhea. Musculoskeletal: Negative for back pain. Skin: Negative for rash. Neurological: Negative for headaches, focal weakness or numbness.  All systems negative/normal/unremarkable except as stated in the HPI  ____________________________________________   PHYSICAL EXAM:  VITAL SIGNS: ED Triage Vitals  Enc Vitals Group     BP 02/04/18 0906 116/84     Pulse Rate 02/04/18 0906 (!) 51     Resp 02/04/18 0906 20     Temp 02/04/18 0906 98.1 F (36.7 C)     Temp Source 02/04/18 0906 Oral     SpO2 02/04/18 0906 95 %     Weight 02/04/18 0907 290 lb (131.5 kg)  Height 02/04/18 0907 6' (1.829 m)     Head Circumference --      Peak Flow --      Pain Score 02/04/18 0907 6     Pain Loc --      Pain Edu? --      Excl. in [place]? --    Constitutional: Alert and oriented. Well appearing and in no distress. Eyes: Conjunctivae are normal. Normal extraocular movements. ENT   Head: Normocephalic and atraumatic.   Nose: No congestion/rhinnorhea.   Mouth/Throat: Mucous membranes are moist.   Neck: No stridor. Cardiovascular: Rapid rate, regular rhythm. No murmurs, rubs, or gallops. Respiratory: Normal respiratory effort without tachypnea nor retractions. Breath sounds are clear and  equal bilaterally. No wheezes/rales/rhonchi. Gastrointestinal: Soft and nontender. Normal bowel sounds Musculoskeletal: Nontender with normal range of motion in extremities. No lower extremity tenderness nor edema. Neurologic:  Normal speech and language. No gross focal neurologic deficits are appreciated.  Skin:  Skin is warm, dry and intact. No rash noted. Psychiatric: Mood and affect are normal. Speech and behavior are normal.  ____________________________________________  EKG: Interpreted by me.  Sinus tachycardia with rate of 112 bpm, biatrial enlargement, rightward axis, normal QT.  ____________________________________________  ED COURSE:  As part of my medical decision making, I reviewed the following data within the [place] History obtained from family if available, nursing notes, old chart and ekg, as well as notes from prior ED visits. Patient presented for chest pain and shortness of breath, we will assess with labs and imaging as indicated at this time.   Procedures ____________________________________________   LABS (pertinent positives/negatives)  Labs Reviewed  BASIC METABOLIC PANEL - Abnormal; Notable for the following components:      Result Value   Glucose, Bld 155 (*)    Creatinine, Ser 1.25 (*)    Calcium 8.7 (*)    All other components within normal limits  TROPONIN I - Abnormal; Notable for the following components:   Troponin I 0.03 (*)    All other components within normal limits  LIPASE, BLOOD - Abnormal; Notable for the following components:   Lipase 69 (*)    All other components within normal limits  BRAIN NATRIURETIC PEPTIDE - Abnormal; Notable for the following components:   B Natriuretic Peptide 760.0 (*)    All other components within normal limits  HEPATIC FUNCTION PANEL - Abnormal; Notable for the following components:   ALT 46 (*)    All other components within normal limits  CBC  URINE DRUG SCREEN, QUALITATIVE ([facility]  ONLY)  ETHANOL    RADIOLOGY Images were viewed by me  Chest x-ray IMPRESSION: Cardiomegaly with pulmonary venous congestion and bilateral mild interstitial prominence. Mild CHF cannot be excluded. ____________________________________________  DIFFERENTIAL DIAGNOSIS   Gastritis, pancreatitis, PE, pneumonia, CHF, MI, unstable angina  FINAL ASSESSMENT AND PLAN  CHF exacerbation, mild pancreatitis    Plan: The patient had presented for chest pain or shortness of breath. Patient's labs are consistent with chronic abnormalities without any acute process. Patient's imaging revealed cardiomegaly with mild CHF.  He was given 80 mg of IV Lasix and has diuresed well.  He also has mild pancreatitis related to recent heavy alcohol intake this weekend and he states he drank so much liquor he impressed himself.  I will place him on an antacid with pain medicine and encourage close outpatient follow-up.   Laurence Aly, MD   Note: This note was generated in part or whole with voice  recognition software. Voice recognition is usually quite accurate but there are transcription errors that can and very often do occur. I apologize for any typographical errors that were not detected and corrected.     Earleen Newport, MD 02/04/18 [phone]

## 2018-02-17 ENCOUNTER — Ambulatory Visit
Admission: EM | Admit: 2018-02-17 | Discharge: 2018-02-17 | Disposition: A | Discharge status: Home / Self Care | Payer: Commercial Managed Care - PPO | Attending: Family Medicine | Admitting: Family Medicine

## 2018-02-17 ENCOUNTER — Encounter: Payer: Self-pay | Admitting: Emergency Medicine

## 2018-02-17 ENCOUNTER — Other Ambulatory Visit: Payer: Self-pay

## 2018-02-17 DIAGNOSIS — J020 Streptococcal pharyngitis: Secondary | ICD-10-CM | POA: Diagnosis not present

## 2018-02-17 LAB — RAPID STREP SCREEN (MED CTR MEBANE ONLY): Streptococcus, Group A Screen (Direct): POSITIVE — AB

## 2018-02-17 LAB — RAPID INFLUENZA A&B ANTIGENS
Influenza A (ARMC): NEGATIVE
Influenza B (ARMC): NEGATIVE

## 2018-02-17 MED ORDER — PENICILLIN G BENZATHINE 1200000 UNIT/2ML IM SUSP
1.2000 10*6.[IU] | Freq: Once | INTRAMUSCULAR | Status: AC
  Administered 2018-02-17: 1.2 10*6.[IU] via INTRAMUSCULAR

## 2018-02-17 NOTE — Discharge Instructions (Addendum)
Take over-the-counter Tylenol or ibuprofen as needed.  Rest. Drink plenty of fluids.   Follow up with your primary care physician this week as needed. Return to Urgent care for new or worsening concerns.

## 2018-02-17 NOTE — ED Triage Notes (Signed)
Pt c/o sore throat, fatigue, dizziness, felt feverish, night sweats, chills, body aches. Started 2 days ago.

## 2018-02-17 NOTE — ED Provider Notes (Addendum)
[facility] ____________________________________________  Time seen: Approximately 11:52 AM  I have reviewed the triage vital signs and the nursing notes.   HISTORY  Chief Complaint Sore Throat and Generalized Body Aches   HPI Carlos Brewer. is a 32 y.o. male presenting for evaluation of 2 days of chills, intermittent body aches, sore throat and subjective fevers.  Patient states that he feels overall tired.  Denies much nasal congestion, and states occasional cough.  States recent family member with strep throat.  Denies other known sick contacts.  States sore throat is currently mild.  Decreased appetite but continues to drink fluids well.  Has not taken any over-the-counter medicine today.  Denies abdominal pain, chest pain, extremity swelling, or recent sickness.  Reports he has chronic shortness of breath, but denies any change or increase.  States chocolate milk helped a sore throat but has not tried anything else.  Denies other aggravating alleviating factors.  States otherwise doing well.    Past Medical History:  Diagnosis Date  . CHF (congestive heart failure) ([place])   . Cocaine abuse ([place])   . HFrEF (heart failure with reduced ejection fraction) ([place])    a. 10/2016 Echo: EF 15-20%, Gr2 DD, mildly dil LA/RA.  Marland Kitchen History of cardiac cath    a. 10/2016 Cath: LM nl, LAD min irregs, LCX nl, RCA nl, EF 15%.  . Morbid obesity ([place])   . Myocarditis ([place])    a. 10/2016 Admit w/ CHF and trop elevation; b. 10/2016 Echo: EF 15-20%; c. 10/2016 Cath: Min irregs in LAD otw nl cors, EF 15%, glob HK.  Marland Kitchen NICM (nonischemic cardiomyopathy) ([place])    a. 10/2016 Echo: EF 15-20%, Gr2 DD; b. 10/2016 Cath: Nl cors.  . Palpitations   . Tobacco abuse     Patient Active Problem List   Diagnosis Date Noted  . Morbid obesity ([place]) 02/07/2017  . Nonischemic cardiomyopathy ([place]) 11/07/2016  . Chronic systolic heart failure ([place]) 11/05/2016  . Anxiety 11/05/2016  . Substance use  disorder 11/05/2016  . Pneumonia 10/24/2016    Past Surgical History:  Procedure Laterality Date  . CORONARY ANGIOPLASTY    . RIGHT/LEFT HEART CATH AND CORONARY ANGIOGRAPHY N/A 10/27/2016   Procedure: RIGHT/LEFT HEART CATH AND CORONARY ANGIOGRAPHY;  Surgeon: Wellington Hampshire, MD;  Location: [place] [facility];  Service: Cardiovascular;  Laterality: N/A;  . WISDOM TOOTH EXTRACTION  2008     No current facility-administered medications for this encounter.   Current Outpatient Medications:  .  acetaminophen (TYLENOL) 500 MG tablet, Take 2 tablets (1,000 mg total) by mouth every 6 (six) hours as needed. (Patient taking differently: Take 1,000 mg by mouth every 6 (six) hours as needed for mild pain or fever. ), Disp: 30 tablet, Rfl: 0 .  albuterol (PROVENTIL HFA;VENTOLIN HFA) 108 (90 Base) MCG/ACT inhaler, Inhale 1-2 puffs into the lungs every 6 (six) hours as needed for wheezing or shortness of breath., Disp: 1 Inhaler, Rfl: 0 .  aspirin EC 81 MG EC tablet, Take 1 tablet (81 mg total) by mouth daily., Disp: 30 tablet, Rfl: 2 .  atorvastatin (LIPITOR) 40 MG tablet, Take 1 tablet (40 mg total) by mouth daily at 6 PM., Disp: 30 tablet, Rfl: 2 .  famotidine (PEPCID) 20 MG tablet, Take 1 tablet (20 mg total) by mouth 2 (two) times daily., Disp: 60 tablet, Rfl: 1 .  oxyCODONE-acetaminophen (PERCOCET) 5-325 MG tablet, Take 1 tablet by mouth every 8 (eight) hours as needed., Disp: 20  tablet, Rfl: 0 .  sacubitril-valsartan (ENTRESTO) 24-26 MG, Take 1 tablet by mouth 2 (two) times daily., Disp: 60 tablet, Rfl: 3 .  sertraline (ZOLOFT) 100 MG tablet, TK 1 T PO QD, Disp: , Rfl: 2 .  spironolactone (ALDACTONE) 25 MG tablet, Take 25 mg by mouth daily., Disp: , Rfl:  .  torsemide (DEMADEX) 20 MG tablet, Take 2 tablets (40 mg total) by mouth 2 (two) times daily., Disp: 120 tablet, Rfl: 7 .  traZODone (DESYREL) 50 MG tablet, Take 1 tablet (50 mg total) by mouth at bedtime., Disp: 30 tablet, Rfl: 0 .   carvedilol (COREG) 3.125 MG tablet, Take 1 tablet (3.125 mg total) by mouth 2 (two) times daily with a meal., Disp: 60 tablet, Rfl: 2  Allergies Patient has no known allergies.  Family History  Problem Relation Age of Onset  . Healthy Mother   . Healthy Father     Social History Social History   Tobacco Use  . Smoking status: Current Every Day Smoker    Packs/day: 0.50    Types: Cigarettes    Last attempt to quit: 10/23/2016    Years since quitting: 1.3  . Smokeless tobacco: Never Used  Substance Use Topics  . Alcohol use: No    Comment: none  . Drug use: Not Currently    Types: Cocaine    Comment: quit 10/23/2016     Review of Systems Constitutional: Positive subjective fevers. ENT: Positive sore throat Cardiovascular: Denies chest pain. Respiratory: Denies any change in chronic shortness of breath. Gastrointestinal: No abdominal pain.   Musculoskeletal: Negative for back pain. Skin: Negative for rash.   ____________________________________________   PHYSICAL EXAM:  VITAL SIGNS: ED Triage Vitals  Enc Vitals Group     BP 02/17/18 1014 (!) 112/91     Pulse Rate 02/17/18 1014 94     Resp 02/17/18 1014 18     Temp 02/17/18 1014 97.7 F (36.5 C)     Temp Source 02/17/18 1014 Oral     SpO2 02/17/18 1014 96 %     Weight 02/17/18 1010 280 lb (127 kg)     Height 02/17/18 1010 6' (1.829 m)     Head Circumference --      Peak Flow --      Pain Score 02/17/18 1009 3     Pain Loc --      Pain Edu? --      Excl. in [place]? --    Constitutional: Alert and oriented. Well appearing and in no acute distress. Eyes: Conjunctivae are normal. PERRL. EOMI. Head: Atraumatic. No sinus tenderness to palpation. No swelling. No erythema.  Ears: no erythema, normal TMs bilaterally.   Nose:No nasal congestion  Mouth/Throat: Mucous membranes are moist.moderate pharyngeal erythema.  2+ bilateral tonsillar swelling.  No exudate.  No uvular shift or deviation. Neck: No stridor.  No  cervical spine tenderness to palpation. Hematological/Lymphatic/Immunilogical: Anterior bilateral cervical lymphadenopathy. Cardiovascular: Normal rate, regular rhythm. Grossly normal heart sounds.  Good peripheral circulation. Respiratory: Normal respiratory effort.  No retractions. No wheezes, rales or rhonchi. Good air movement.  Gastrointestinal: Soft and nontender.  Musculoskeletal: Ambulatory with steady gait. No cervical, thoracic or lumbar tenderness to palpation. Neurologic:  Normal speech and language. No gait instability. Skin:  Skin appears warm, dry and intact. No rash noted. Psychiatric: Mood and affect are normal. Speech and behavior are normal. ___________________________________________   LABS (all labs ordered are listed, but only abnormal results are displayed)  Labs Reviewed  RAPID STREP SCREEN ([facility] ONLY) - Abnormal; Notable for the following components:      Result Value   Streptococcus, Group A Screen (Direct) POSITIVE (*)    All other components within normal limits  RAPID INFLUENZA A&B ANTIGENS ([facility] ONLY)   ____________________________________________   PROCEDURES Procedures   INITIAL IMPRESSION / ASSESSMENT AND PLAN / ED COURSE  Pertinent labs & imaging results that were available during my care of the patient were reviewed by me and considered in my medical decision making (see chart for details).  Well-appearing patient.  No acute distress.  Influenza negative.  Strep positive.  Discussed treatment options with patient.  Patient elected for Bicillin.  Bicillin 1,200,000 units given once IM.  Encourage rest, fluids, supportive care.  Work note given for today and tomorrow.Discussed indication, risks and benefits of medications with patient.  Discussed follow up with Primary care physician this week. Discussed follow up and return parameters including no resolution or any worsening concerns. Patient verbalized understanding and agreed to plan.    ____________________________________________   FINAL CLINICAL IMPRESSION(S) / ED DIAGNOSES  Final diagnoses:  Strep pharyngitis     ED Discharge Orders    None       Note: This dictation was prepared with Dragon dictation along with smaller phrase technology. Any transcriptional errors that result from this process are unintentional.        Marylene Land, NP 02/17/18 1156

## 2018-04-09 DIAGNOSIS — Z7982 Long term (current) use of aspirin: Secondary | ICD-10-CM | POA: Diagnosis not present

## 2018-04-09 DIAGNOSIS — F17210 Nicotine dependence, cigarettes, uncomplicated: Secondary | ICD-10-CM | POA: Insufficient documentation

## 2018-04-09 DIAGNOSIS — Z79899 Other long term (current) drug therapy: Secondary | ICD-10-CM | POA: Insufficient documentation

## 2018-04-09 DIAGNOSIS — I509 Heart failure, unspecified: Secondary | ICD-10-CM | POA: Insufficient documentation

## 2018-04-09 DIAGNOSIS — R0600 Dyspnea, unspecified: Secondary | ICD-10-CM | POA: Diagnosis present

## 2018-04-09 MED ORDER — SODIUM CHLORIDE 0.9% FLUSH: 3.0000 mL | Freq: Once | INTRAVENOUS | Status: DC

## 2018-04-09 NOTE — ED Triage Notes (Signed)
Patient c/o chest pain, SOB. Patient reports hx of CHF.

## 2018-04-10 ENCOUNTER — Emergency Department: Payer: Commercial Managed Care - PPO

## 2018-04-10 ENCOUNTER — Other Ambulatory Visit: Payer: Self-pay

## 2018-04-10 ENCOUNTER — Emergency Department
Admission: EM | Admit: 2018-04-10 | Discharge: 2018-04-10 | Disposition: A | Discharge status: Home / Self Care | Payer: Commercial Managed Care - PPO | Attending: Emergency Medicine | Admitting: Emergency Medicine

## 2018-04-10 DIAGNOSIS — I509 Heart failure, unspecified: Secondary | ICD-10-CM

## 2018-04-10 LAB — BASIC METABOLIC PANEL
Anion gap: 7 (ref 5–15)
BUN: 22 mg/dL — AB (ref 6–20)
CO2: 24 mmol/L (ref 22–32)
Calcium: 8.6 mg/dL — ABNORMAL LOW (ref 8.9–10.3)
Chloride: 107 mmol/L (ref 98–111)
Creatinine, Ser: 1.56 mg/dL — ABNORMAL HIGH (ref 0.61–1.24)
GFR calc Af Amer: >60 mL/min (ref >60)
GFR calc non Af Amer: 58 mL/min — ABNORMAL LOW (ref >60)
Glucose, Bld: 147 mg/dL — ABNORMAL HIGH (ref 70–99)
Potassium: 3.5 mmol/L (ref 3.5–5.1)
Sodium: 138 mmol/L (ref 135–145)

## 2018-04-10 LAB — TROPONIN I
Troponin I: 0.03 ng/mL (ref <0.03)
Troponin I: 0.03 ng/mL (ref <0.03)

## 2018-04-10 LAB — CBC
HCT: 43.2 % (ref 39.0–52.0)
Hemoglobin: 14.4 g/dL (ref 13.0–17.0)
MCH: 30.4 pg (ref 26.0–34.0)
MCHC: 33.3 g/dL (ref 30.0–36.0)
MCV: 91.1 fL (ref 80.0–100.0)
PLATELETS: 319 10*3/uL (ref 150–400)
RBC: 4.74 MIL/uL (ref 4.22–5.81)
RDW: 12.4 % (ref 11.5–15.5)
WBC: 8.8 10*3/uL (ref 4.0–10.5)
nRBC: 0 % (ref 0.0–0.2)

## 2018-04-10 LAB — BRAIN NATRIURETIC PEPTIDE: B NATRIURETIC PEPTIDE 5: 798 pg/mL — AB (ref 0.0–100.0)

## 2018-04-10 MED ORDER — KETOROLAC TROMETHAMINE 30 MG/ML IJ SOLN: 15.0000 mg | Freq: Once | INTRAMUSCULAR | Status: DC

## 2018-04-10 MED ORDER — LIDOCAINE 5 % EX PTCH
2.0000 | MEDICATED_PATCH | CUTANEOUS | Status: DC
  Administered 2018-04-10: 2 via TRANSDERMAL
  Filled 2018-04-10: qty 2

## 2018-04-10 MED ORDER — IOHEXOL 350 MG/ML SOLN
100.0000 mL | Freq: Once | INTRAVENOUS | Status: AC | PRN
  Administered 2018-04-10: 100 mL via INTRAVENOUS

## 2018-04-10 NOTE — ED Notes (Signed)
Peripheral IV discontinued. Catheter intact. No signs of infiltration or redness. Gauze applied to IV site.   Discharge instructions reviewed with patient. Questions fielded by this RN. Patient verbalizes understanding of instructions. Patient discharged home in stable condition per brown. No acute distress noted at time of discharge.

## 2018-04-10 NOTE — ED Notes (Addendum)
Pt reports sciatica pain left side predominately, EDP notified, pt is driving

## 2018-04-10 NOTE — ED Notes (Signed)
Patient transported to CT 

## 2018-04-10 NOTE — ED Notes (Signed)
ED Provider at bedside. 

## 2018-04-10 NOTE — ED Provider Notes (Signed)
[facility] Emergency Department Provider Note  ____________________________________________   First MD Initiated Contact with Patient 04/10/18 0106     (approximate)  I have reviewed the triage vital signs and the nursing notes.   HISTORY  Chief Complaint Shortness of Breath and Chest Pain    HPI Carlos Brewer. is a 33 y.o. male with below list of chronic medical conditions presents to the emergency department with progressive dyspnea over the course of today.  Patient states that he noted increased dyspnea yesterday morning and as such took an additional dose of furosemide.  Patient states that in total he is taking 6 torsemide approximate 20 mg tablets today stating that he took his first at approximately 7 AM subsequently at 3 and then last night at 10 PM.  Patient states that he has had very little urine output before arrival to the emergency department but states that while in the waiting room he has had multiple episodes of urination with much improved dyspnea.  Patient denies any chest pain.  Patient denies any orthopnea.  Patient denies any lower extremity pain or swelling.  Past Medical History:  Diagnosis Date  . CHF (congestive heart failure) ([place])   . Cocaine abuse ([place])   . HFrEF (heart failure with reduced ejection fraction) ([place])    a. 10/2016 Echo: EF 15-20%, Gr2 DD, mildly dil LA/RA.  Marland Kitchen History of cardiac cath    a. 10/2016 Cath: LM nl, LAD min irregs, LCX nl, RCA nl, EF 15%.  . Morbid obesity ([place])   . Myocarditis ([place])    a. 10/2016 Admit w/ CHF and trop elevation; b. 10/2016 Echo: EF 15-20%; c. 10/2016 Cath: Min irregs in LAD otw nl cors, EF 15%, glob HK.  Marland Kitchen NICM (nonischemic cardiomyopathy) ([place])    a. 10/2016 Echo: EF 15-20%, Gr2 DD; b. 10/2016 Cath: Nl cors.  . Palpitations   . Tobacco abuse     Patient Active Problem List   Diagnosis Date Noted  . Morbid obesity ([place]) 02/07/2017  . Nonischemic cardiomyopathy ([place])  11/07/2016  . Chronic systolic heart failure ([place]) 11/05/2016  . Anxiety 11/05/2016  . Substance use disorder 11/05/2016  . Pneumonia 10/24/2016    Past Surgical History:  Procedure Laterality Date  . CORONARY ANGIOPLASTY    . RIGHT/LEFT HEART CATH AND CORONARY ANGIOGRAPHY N/A 10/27/2016   Procedure: RIGHT/LEFT HEART CATH AND CORONARY ANGIOGRAPHY;  Surgeon: Wellington Hampshire, MD;  Location: [place] CV LAB;  Service: Cardiovascular;  Laterality: N/A;  . WISDOM TOOTH EXTRACTION  2008    Prior to Admission medications   Medication Sig Start Date End Date Taking? Authorizing Provider  acetaminophen (TYLENOL) 500 MG tablet Take 2 tablets (1,000 mg total) by mouth every 6 (six) hours as needed. Patient taking differently: Take 1,000 mg by mouth every 6 (six) hours as needed for mild pain or fever.  10/12/16   Betancourt, Aura Fey, NP  albuterol (PROVENTIL HFA;VENTOLIN HFA) 108 (90 Base) MCG/ACT inhaler Inhale 1-2 puffs into the lungs every 6 (six) hours as needed for wheezing or shortness of breath. 08/31/17   Coral Spikes, DO  aspirin EC 81 MG EC tablet Take 1 tablet (81 mg total) by mouth daily. 10/30/16   Demetrios Loll, MD  atorvastatin (LIPITOR) 40 MG tablet Take 1 tablet (40 mg total) by mouth daily at 6 PM. 10/29/16   Demetrios Loll, MD  carvedilol (COREG) 3.125 MG tablet Take 1 tablet (3.125 mg total) by mouth 2 (two)  times daily with a meal. 10/29/16   Demetrios Loll, MD  famotidine (PEPCID) 20 MG tablet Take 1 tablet (20 mg total) by mouth 2 (two) times daily. 02/04/18   Earleen Newport, MD  oxyCODONE-acetaminophen (PERCOCET) 5-325 MG tablet Take 1 tablet by mouth every 8 (eight) hours as needed. 02/04/18   Earleen Newport, MD  sacubitril-valsartan (ENTRESTO) 24-26 MG Take 1 tablet by mouth 2 (two) times daily. 12/05/16   Minna Merritts, MD  sertraline (ZOLOFT) 100 MG tablet TK 1 T PO QD 11/12/16   [provider]  spironolactone (ALDACTONE) 25 MG tablet Take 25 mg by mouth  daily.    [provider]  torsemide (DEMADEX) 20 MG tablet Take 2 tablets (40 mg total) by mouth 2 (two) times daily. 11/07/16   Minna Merritts, MD  traZODone (DESYREL) 50 MG tablet Take 1 tablet (50 mg total) by mouth at bedtime. 10/29/16   Demetrios Loll, MD    Allergies Patient has no known allergies.  Family History  Problem Relation Age of Onset  . Healthy Mother   . Healthy Father     Social History Social History   Tobacco Use  . Smoking status: Current Every Day Smoker    Packs/day: 0.50    Types: Cigarettes    Last attempt to quit: 10/23/2016    Years since quitting: 1.4  . Smokeless tobacco: Never Used  Substance Use Topics  . Alcohol use: No    Comment: none  . Drug use: Not Currently    Types: Cocaine    Comment: quit 10/23/2016     Review of Systems Constitutional: No fever/chills Eyes: No visual changes. ENT: No sore throat. Cardiovascular: Denies chest pain. Respiratory: Positive for dyspnea Gastrointestinal: No abdominal pain.  No nausea, no vomiting.  No diarrhea.  No constipation. Genitourinary: Negative for dysuria. Musculoskeletal: Negative for neck pain.  Negative for back pain. Integumentary: Negative for rash. Neurological: Negative for headaches, focal weakness or numbness.  ____________________________________________   PHYSICAL EXAM:  VITAL SIGNS: ED Triage Vitals [04/09/18 2355]  Enc Vitals Group     BP 113/81     Pulse Rate (!) 105     Resp (!) 22     Temp 97.9 F (36.6 C)     Temp src      SpO2 98 %     Weight 127 kg (280 lb)     Height 1.829 m (6')     Head Circumference      Peak Flow      Pain Score 3     Pain Loc      Pain Edu?      Excl. in [place]?     Constitutional: Alert and oriented. Well appearing and in no acute distress. Eyes: Conjunctivae are normal.  Mouth/Throat: Mucous membranes are moist.  Oropharynx non-erythematous. Neck: No stridor.   Cardiovascular: Normal rate, regular rhythm. Good peripheral  circulation. Grossly normal heart sounds. Respiratory: Normal respiratory effort.  No retractions. Lungs CTAB. Gastrointestinal: Soft and nontender. No distention.  Musculoskeletal: No lower extremity tenderness nor edema. No gross deformities of extremities. Neurologic:  Normal speech and language. No gross focal neurologic deficits are appreciated.  Skin:  Skin is warm, dry and intact. No rash noted. Psychiatric: Mood and affect are normal. Speech and behavior are normal.  ____________________________________________   LABS (all labs ordered are listed, but only abnormal results are displayed)  Labs Reviewed  BASIC METABOLIC PANEL - Abnormal; Notable for the following  components:      Result Value   Glucose, Bld 147 (*)    BUN 22 (*)    Creatinine, Ser 1.56 (*)    Calcium 8.6 (*)    GFR calc non Af Amer 58 (*)    All other components within normal limits  TROPONIN I - Abnormal; Notable for the following components:   Troponin I 0.03 (*)    All other components within normal limits  BRAIN NATRIURETIC PEPTIDE - Abnormal; Notable for the following components:   B Natriuretic Peptide 798.0 (*)    All other components within normal limits  TROPONIN I - Abnormal; Notable for the following components:   Troponin I 0.03 (*)    All other components within normal limits  CBC   ____________________________________________  EKG  ED ECG REPORT I, [place] Tashiana Lamarca, the attending physician, personally viewed and interpreted this ECG.   Date: 04/09/2018  EKG Time: 9:53 PM  Rate: 109  Rhythm: Sinus tachycardia  Axis: Rightward axis deviation  Intervals: Normal  ST&T Change: None  ____________________________________________  RADIOLOGY I, [place] Yehudah Standing, personally viewed and evaluated these images (plain radiographs) as part of my medical decision making, as well as reviewing the written report by the radiologist.  ED MD interpretation: Chest x-ray revealed stable mild  cardiomegaly with mild residual vascular congestion improved from the prior study per radiologist  CT scan PE protocol revealed no pulmonary emboli moderate cardiomegaly with mild pulmonary edema per radiologist.  Official radiology report(s): Dg Chest 2 View  Result Date: 04/10/2018 CLINICAL DATA:  Chest pain and shortness of breath. EXAM: CHEST - 2 VIEW COMPARISON:  Radiographs 03/06/2018 FINDINGS: Mild cardiomegaly, unchanged from prior exam. Mild residual vascular congestion, improved from prior. No pulmonary edema. No confluent airspace disease. No pleural effusion or pneumothorax. No acute osseous abnormalities. IMPRESSION: Stable mild cardiomegaly. Mild residual vascular congestion, improved from prior. Electronically Signed   By: Keith Rake M.D.   On: 04/10/2018 00:28   Ct Angio Chest Pe W And/or Wo Contrast  Result Date: 04/10/2018 CLINICAL DATA:  Chest pain and shortness of breath EXAM: CT ANGIOGRAPHY CHEST WITH CONTRAST TECHNIQUE: Multidetector CT imaging of the chest was performed using the standard protocol during bolus administration of intravenous contrast. Multiplanar CT image reconstructions and MIPs were obtained to evaluate the vascular anatomy. CONTRAST:  167mL OMNIPAQUE IOHEXOL 350 MG/ML SOLN COMPARISON:  None. FINDINGS: Cardiovascular: --Pulmonary arteries: Contrast injection is sufficient to demonstrate satisfactory opacification of the pulmonary arteries to the segmental level. There is no pulmonary embolus. The main pulmonary artery is within normal limits for size. --Aorta: Limited opacification of the aorta due to bolus timing optimization for the pulmonary arteries. Conventional 3 vessel aortic branching pattern. The aortic course and caliber are normal. There is no aortic atherosclerosis. --Heart: Moderate cardiomegaly.  No pericardial effusion. Mediastinum/Nodes: No mediastinal, hilar or axillary lymphadenopathy. The visualized thyroid and thoracic esophageal course are  unremarkable. Lungs/Pleura: Mild ground-glass opacity within the lungs, likely pulmonary edema. No pleural effusion or pneumothorax. No focal consolidation. Upper Abdomen: Contrast bolus timing is not optimized for evaluation of the abdominal organs. Within this limitation, the visualized organs of the upper abdomen are normal. Musculoskeletal: No chest wall abnormality. No acute or significant osseous findings. Review of the MIP images confirms the above findings. IMPRESSION: 1. No pulmonary embolus. 2. Moderate cardiomegaly with mild pulmonary edema. Electronically Signed   By: Ulyses Jarred M.D.   On: 04/10/2018 04:12      Procedures  ____________________________________________   INITIAL IMPRESSION / ASSESSMENT AND PLAN / ED COURSE  As part of my medical decision making, I reviewed the following data within the electronic MEDICAL RECORD NUMBER 33 year old male presenting with above-stated history and physical exam secondary to dyspnea which is improved since arrival to the emergency department.  Patient with no apparent respiratory difficulty on exam.  Consider the possibility of CHF exacerbation with resultant pulmonary edema, also considered possibility of MI and pulmonary emboli.  Patient's BNP 798 consistent with previous BNP's troponin persistently elevated at 0.03.  Troponin was checked twice and remained at 0.03.  Patient's creatinine 1.56 which is increased from previous creatinine of 1.25 which was performed in November.  I suspect this to be secondary to the fact that the patient took in total six 20 mg tablets (although patient is unsure of the exact grams of each tablet) of Turosemide over the course of today.  Patient continued to have urine production while in the emergency department with progressive improvement with resolution of dyspnea.  Given current initial complaint however CT scan of the chest was performed which revealed moderate cardiomegaly with mild pulmonary edema.  Spoke  with the patient at length regarding the necessity of following up with cardiology within 48 hours.  I advised the patient at length regarding warning signs that would warrant immediate return to the emergency department including worsening dyspnea chest pain increasing lower extremity edema or any other emergency medical concerns.  Clinical Course as of Apr 10 2237  Sat Apr 10, 2018  0141 Creatinine(!): 1.56 [RB]    Clinical Course User Index [RB] Gregor Hams, MD    ____________________________________________  FINAL CLINICAL IMPRESSION(S) / ED DIAGNOSES  Final diagnoses:  Acute on chronic congestive heart failure, unspecified heart failure type ([place])     MEDICATIONS GIVEN DURING THIS VISIT:  Medications  iohexol (OMNIPAQUE) 350 MG/ML injection 100 mL (100 mLs Intravenous Contrast Given 04/10/18 0346)     ED Discharge Orders    None       Note:  This document was prepared using Dragon voice recognition software and may include unintentional dictation errors.    Gregor Hams, MD 04/10/18 [phone]

## 2018-04-10 NOTE — ED Notes (Signed)
CT called for proper IV established

## 2018-06-13 ENCOUNTER — Emergency Department (HOSPITAL_BASED_OUTPATIENT_CLINIC_OR_DEPARTMENT_OTHER): Payer: Commercial Managed Care - PPO

## 2018-06-13 ENCOUNTER — Other Ambulatory Visit: Payer: Self-pay

## 2018-06-13 ENCOUNTER — Emergency Department (HOSPITAL_BASED_OUTPATIENT_CLINIC_OR_DEPARTMENT_OTHER)
Admission: EM | Admit: 2018-06-13 | Discharge: 2018-06-13 | Disposition: A | Discharge status: Home / Self Care | Payer: Commercial Managed Care - PPO | Attending: Emergency Medicine | Admitting: Emergency Medicine

## 2018-06-13 ENCOUNTER — Encounter (HOSPITAL_BASED_OUTPATIENT_CLINIC_OR_DEPARTMENT_OTHER): Payer: Self-pay

## 2018-06-13 DIAGNOSIS — I509 Heart failure, unspecified: Secondary | ICD-10-CM | POA: Diagnosis not present

## 2018-06-13 DIAGNOSIS — R0789 Other chest pain: Secondary | ICD-10-CM | POA: Insufficient documentation

## 2018-06-13 DIAGNOSIS — F17210 Nicotine dependence, cigarettes, uncomplicated: Secondary | ICD-10-CM | POA: Insufficient documentation

## 2018-06-13 DIAGNOSIS — R0602 Shortness of breath: Secondary | ICD-10-CM | POA: Diagnosis present

## 2018-06-13 LAB — BASIC METABOLIC PANEL
Anion gap: 8 (ref 5–15)
BUN: 17 mg/dL (ref 6–20)
CO2: 23 mmol/L (ref 22–32)
Calcium: 9.2 mg/dL (ref 8.9–10.3)
Chloride: 105 mmol/L (ref 98–111)
Creatinine, Ser: 1.12 mg/dL (ref 0.61–1.24)
GFR calc Af Amer: >60 mL/min (ref >60)
GFR calc non Af Amer: >60 mL/min (ref >60)
GLUCOSE: 151 mg/dL — AB (ref 70–99)
Potassium: 3.4 mmol/L — ABNORMAL LOW (ref 3.5–5.1)
Sodium: 136 mmol/L (ref 135–145)

## 2018-06-13 LAB — CBC
HCT: 41.2 % (ref 39.0–52.0)
Hemoglobin: 13.2 g/dL (ref 13.0–17.0)
MCH: 29.9 pg (ref 26.0–34.0)
MCHC: 32 g/dL (ref 30.0–36.0)
MCV: 93.2 fL (ref 80.0–100.0)
Platelets: 222 10*3/uL (ref 150–400)
RBC: 4.42 MIL/uL (ref 4.22–5.81)
RDW: 12.8 % (ref 11.5–15.5)
WBC: 6.2 10*3/uL (ref 4.0–10.5)
nRBC: 0 % (ref 0.0–0.2)

## 2018-06-13 LAB — TROPONIN I
Troponin I: 0.04 ng/mL (ref <0.03)
Troponin I: 0.04 ng/mL (ref <0.03)

## 2018-06-13 LAB — BRAIN NATRIURETIC PEPTIDE: B Natriuretic Peptide: 853.6 pg/mL — ABNORMAL HIGH (ref 0.0–100.0)

## 2018-06-13 MED ORDER — FUROSEMIDE 10 MG/ML IJ SOLN
80.0000 mg | Freq: Once | INTRAMUSCULAR | Status: AC
  Administered 2018-06-13: 80 mg via INTRAVENOUS
  Filled 2018-06-13: qty 8

## 2018-06-13 MED ORDER — SODIUM CHLORIDE 0.9% FLUSH
3.0000 mL | Freq: Once | INTRAVENOUS | Status: DC
  Filled 2018-06-13: qty 3

## 2018-06-13 NOTE — ED Notes (Signed)
Patient transported to X-ray 

## 2018-06-13 NOTE — Discharge Instructions (Addendum)
You were evaluated in the Emergency Department and after careful evaluation, we did not find any emergent condition requiring admission or further testing in the hospital.  Your symptoms today seem to be due to a flare of your heart failure.  Please call your cardiologist as soon as possible to discuss your fluid pill dosing.  Please return to the Emergency Department if you experience any worsening of your condition.  We encourage you to follow up with a primary care provider.  Thank you for allowing [place] to be a part of your care.

## 2018-06-13 NOTE — ED Notes (Signed)
Dr. Sedonia Small aware that the troponin is 0.04

## 2018-06-13 NOTE — ED Provider Notes (Signed)
[facility] Emergency Department Provider Note MRN:  [id number]  Arrival date & time: 06/13/18     Chief Complaint   Shortness of Breath   History of Present Illness   Carlos Life. is a 33 y.o. year-old male with a history of CHF, cocaine abuse presenting to the ED with chief complaint of shortness of breath.  3 to 4 days of progressively worsening shortness of breath, made worse with exertion or laying flat.  Denies fever, mild cough.  Notes a pressure-like sensation in the middle of his chest when he tries to breathe.  Denies abdominal pain, no numbness weakness to the arms or legs.  Has been taking his torsemide as directed.  Symptoms are moderate, constant.  Review of Systems  A complete 10 system review of systems was obtained and all systems are negative except as noted in the HPI and PMH.   Patient's Health History    Past Medical History:  Diagnosis Date  . CHF (congestive heart failure) ([place])   . Cocaine abuse ([place])   . HFrEF (heart failure with reduced ejection fraction) ([place])    a. 10/2016 Echo: EF 15-20%, Gr2 DD, mildly dil LA/RA.  Marland Kitchen History of cardiac cath    a. 10/2016 Cath: LM nl, LAD min irregs, LCX nl, RCA nl, EF 15%.  . Morbid obesity ([place])   . Myocarditis ([place])    a. 10/2016 Admit w/ CHF and trop elevation; b. 10/2016 Echo: EF 15-20%; c. 10/2016 Cath: Min irregs in LAD otw nl cors, EF 15%, glob HK.  Marland Kitchen NICM (nonischemic cardiomyopathy) ([place])    a. 10/2016 Echo: EF 15-20%, Gr2 DD; b. 10/2016 Cath: Nl cors.  . Palpitations   . Tobacco abuse     Past Surgical History:  Procedure Laterality Date  . CORONARY ANGIOPLASTY    . RIGHT/LEFT HEART CATH AND CORONARY ANGIOGRAPHY N/A 10/27/2016   Procedure: RIGHT/LEFT HEART CATH AND CORONARY ANGIOGRAPHY;  Surgeon: Wellington Hampshire, MD;  Location: [place] CV LAB;  Service: Cardiovascular;  Laterality: N/A;  . WISDOM TOOTH EXTRACTION  2008    Family History  Problem Relation Age of  Onset  . Healthy Mother   . Healthy Father     Social History   Socioeconomic History  . Marital status: Single    Spouse name: Not on file  . Number of children: Not on file  . Years of education: Not on file  . Highest education level: Not on file  Occupational History  . Not on file  Social Needs  . Financial resource strain: Not on file  . Food insecurity:    Worry: Not on file    Inability: Not on file  . Transportation needs:    Medical: Not on file    Non-medical: Not on file  Tobacco Use  . Smoking status: Current Every Day Smoker    Packs/day: 0.50    Types: Cigarettes    Last attempt to quit: 10/23/2016    Years since quitting: 1.6  . Smokeless tobacco: Never Used  Substance and Sexual Activity  . Alcohol use: Yes  . Drug use: Not Currently    Types: Cocaine    Comment: quit 10/23/2016   . Sexual activity: Yes    Birth control/protection: None  Lifestyle  . Physical activity:    Days per week: Not on file    Minutes per session: Not on file  . Stress: Not on file  Relationships  . Social connections:  Talks on phone: Not on file    Gets together: Not on file    Attends religious service: Not on file    Active member of club or organization: Not on file    Attends meetings of clubs or organizations: Not on file    Relationship status: Not on file  . Intimate partner violence:    Fear of current or ex partner: Not on file    Emotionally abused: Not on file    Physically abused: Not on file    Forced sexual activity: Not on file  Other Topics Concern  . Not on file  Social History Narrative   ** Merged History Encounter **       Lives in [place] with his spouse.  He does not routinely exercise.     Physical Exam  Vital Signs and Nursing Notes reviewed Vitals:   06/13/18 2100 06/13/18 2210  BP: 117/74   Pulse: (!) 104   Resp: (!) 21 20  Temp:    SpO2: 96%     CONSTITUTIONAL: Well-appearing, NAD NEURO:  Alert and oriented x 3, no focal  deficits EYES:  eyes equal and reactive ENT/NECK:  no LAD, no JVD CARDIO: Tachycardic rate, well-perfused, normal S1 and S2 PULM: Decreased breath sounds bilateral bases GI/GU:  normal bowel sounds, non-distended, non-tender MSK/SPINE:  No gross deformities, no edema SKIN:  no rash, atraumatic PSYCH:  Appropriate speech and behavior  Diagnostic and Interventional Summary    EKG Interpretation  Date/Time:  Sunday June 13 2018 18:39:34 EDT Ventricular Rate:  104 PR Interval:    QRS Duration: 104 QT Interval:  368 QTC Calculation: 484 R Axis:   95 Text Interpretation:  Sinus tachycardia Borderline prolonged PR interval Left atrial enlargement Anterior infarct, old Nonspecific T abnormalities, lateral leads Confirmed by Gerlene Fee [phone]) on 06/13/2018 7:13:49 PM      Labs Reviewed  BASIC METABOLIC PANEL - Abnormal; Notable for the following components:      Result Value   Potassium 3.4 (*)    Glucose, Bld 151 (*)    All other components within normal limits  BRAIN NATRIURETIC PEPTIDE - Abnormal; Notable for the following components:   B Natriuretic Peptide 853.6 (*)    All other components within normal limits  TROPONIN I - Abnormal; Notable for the following components:   Troponin I 0.04 (*)    All other components within normal limits  TROPONIN I - Abnormal; Notable for the following components:   Troponin I 0.04 (*)    All other components within normal limits  CBC    DG Chest 2 View  Final Result      Medications  sodium chloride flush (NS) 0.9 % injection 3 mL (3 mLs Intravenous Not Given 06/13/18 1920)  furosemide (LASIX) injection 80 mg (80 mg Intravenous Given 06/13/18 2021)     Procedures Critical Care  ED Course and Medical Decision Making  I have reviewed the triage vital signs and the nursing notes.  Pertinent labs & imaging results that were available during my care of the patient were reviewed by me and considered in my medical decision making (see  below for details).  Favoring CHF exacerbation in this 33 year old male with history of the same.  No significant lower extremity edema but patient explains this is normal for him, does not usually swell.  Little to no concern for pulmonary embolism given lack of signs of DVT, little to no risk factors.  Chest x-ray reveals mild  pulmonary edema, cardiomegaly, correlating with elevated BNP greater than 800.  Patient was given 80 mg IV Lasix and was able to make nearly 1 L of urine.  Upon reassessment, patient feels much better, no longer tachypneic, resting comfortably, was able to ambulate in the emergency department with normal oxygenation saturations.  Patient's initial troponin was 0.04, likely strain in the setting of CHF will repeat to ensure it is not rising significantly and then attempt discharge with follow-up with his cardiologist.  Troponin is flat at 0.04, patient continues to feel much improved and is appropriate for discharge.  After the discussed management above, the patient was determined to be safe for discharge.  The patient was in agreement with this plan and all questions regarding their care were answered.  ED return precautions were discussed and the patient will return to the ED with any significant worsening of condition.  Barth Kirks. Sedonia Small, [place] [email or web address]  Final Clinical Impressions(s) / ED Diagnoses     ICD-10-CM   1. Acute congestive heart failure, unspecified heart failure type [facility]) I50.9     ED Discharge Orders    None         Maudie Flakes, MD 06/13/18 2218

## 2018-06-13 NOTE — ED Notes (Signed)
Pt ambulated in room. SpO2 remains 99% with activity. No dyspnea noted with exertion.

## 2018-06-13 NOTE — ED Triage Notes (Signed)
Pt c/o ShOB for a few days. Pt denies fever, cough, sore throat. Pt traveled to [facility] 3/14-15.

## 2018-06-13 NOTE — ED Notes (Signed)
Notified lab to add on troponin and BNP

## 2018-07-02 ENCOUNTER — Other Ambulatory Visit: Payer: Self-pay | Admitting: Family Medicine

## 2018-07-12 ENCOUNTER — Encounter (HOSPITAL_BASED_OUTPATIENT_CLINIC_OR_DEPARTMENT_OTHER): Payer: Self-pay | Admitting: *Deleted

## 2018-07-12 ENCOUNTER — Other Ambulatory Visit: Payer: Self-pay

## 2018-07-12 ENCOUNTER — Emergency Department (HOSPITAL_BASED_OUTPATIENT_CLINIC_OR_DEPARTMENT_OTHER): Payer: Commercial Managed Care - PPO

## 2018-07-12 ENCOUNTER — Inpatient Hospital Stay (HOSPITAL_BASED_OUTPATIENT_CLINIC_OR_DEPARTMENT_OTHER)
Admission: EM | Admit: 2018-07-12 | Discharge: 2018-07-17 | DRG: 292 | Disposition: A | Discharge status: Home / Self Care | Payer: Commercial Managed Care - PPO | Attending: Internal Medicine | Admitting: Internal Medicine

## 2018-07-12 DIAGNOSIS — Z9861 Coronary angioplasty status: Secondary | ICD-10-CM | POA: Diagnosis not present

## 2018-07-12 DIAGNOSIS — G4733 Obstructive sleep apnea (adult) (pediatric): Secondary | ICD-10-CM | POA: Diagnosis not present

## 2018-07-12 DIAGNOSIS — F1420 Cocaine dependence, uncomplicated: Secondary | ICD-10-CM | POA: Diagnosis present

## 2018-07-12 DIAGNOSIS — Z7982 Long term (current) use of aspirin: Secondary | ICD-10-CM | POA: Diagnosis not present

## 2018-07-12 DIAGNOSIS — E876 Hypokalemia: Secondary | ICD-10-CM | POA: Diagnosis present

## 2018-07-12 DIAGNOSIS — Z8249 Family history of ischemic heart disease and other diseases of the circulatory system: Secondary | ICD-10-CM

## 2018-07-12 DIAGNOSIS — Z20828 Contact with and (suspected) exposure to other viral communicable diseases: Secondary | ICD-10-CM | POA: Diagnosis present

## 2018-07-12 DIAGNOSIS — Z79899 Other long term (current) drug therapy: Secondary | ICD-10-CM

## 2018-07-12 DIAGNOSIS — I509 Heart failure, unspecified: Secondary | ICD-10-CM | POA: Diagnosis not present

## 2018-07-12 DIAGNOSIS — I428 Other cardiomyopathies: Secondary | ICD-10-CM | POA: Diagnosis present

## 2018-07-12 DIAGNOSIS — N182 Chronic kidney disease, stage 2 (mild): Secondary | ICD-10-CM | POA: Diagnosis not present

## 2018-07-12 DIAGNOSIS — Z8701 Personal history of pneumonia (recurrent): Secondary | ICD-10-CM | POA: Diagnosis not present

## 2018-07-12 DIAGNOSIS — F1920 Other psychoactive substance dependence, uncomplicated: Secondary | ICD-10-CM

## 2018-07-12 DIAGNOSIS — Z9989 Dependence on other enabling machines and devices: Secondary | ICD-10-CM

## 2018-07-12 DIAGNOSIS — Z6839 Body mass index (BMI) 39.0-39.9, adult: Secondary | ICD-10-CM | POA: Diagnosis not present

## 2018-07-12 DIAGNOSIS — E669 Obesity, unspecified: Secondary | ICD-10-CM

## 2018-07-12 DIAGNOSIS — I5023 Acute on chronic systolic (congestive) heart failure: Secondary | ICD-10-CM | POA: Diagnosis not present

## 2018-07-12 DIAGNOSIS — K08409 Partial loss of teeth, unspecified cause, unspecified class: Secondary | ICD-10-CM | POA: Diagnosis present

## 2018-07-12 HISTORY — DX: Sleep apnea, unspecified: G47.30

## 2018-07-12 LAB — BASIC METABOLIC PANEL
Anion gap: 10 (ref 5–15)
BUN: 14 mg/dL (ref 6–20)
CO2: 22 mmol/L (ref 22–32)
Calcium: 9.2 mg/dL (ref 8.9–10.3)
Chloride: 104 mmol/L (ref 98–111)
Creatinine, Ser: 1.26 mg/dL — ABNORMAL HIGH (ref 0.61–1.24)
GFR calc Af Amer: >60 mL/min (ref >60)
GFR calc non Af Amer: >60 mL/min (ref >60)
Glucose, Bld: 164 mg/dL — ABNORMAL HIGH (ref 70–99)
Potassium: 3.5 mmol/L (ref 3.5–5.1)
Sodium: 136 mmol/L (ref 135–145)

## 2018-07-12 LAB — CBC WITH DIFFERENTIAL/PLATELET
Abs Immature Granulocytes: 0.05 10*3/uL (ref 0.00–0.07)
Basophils Absolute: 0.1 10*3/uL (ref 0.0–0.1)
Basophils Relative: 0 %
Eosinophils Absolute: 0.1 10*3/uL (ref 0.0–0.5)
Eosinophils Relative: 1 %
HCT: 43 % (ref 39.0–52.0)
Hemoglobin: 13.9 g/dL (ref 13.0–17.0)
Immature Granulocytes: 0 %
Lymphocytes Relative: 14 %
Lymphs Abs: 1.6 10*3/uL (ref 0.7–4.0)
MCH: 30.4 pg (ref 26.0–34.0)
MCHC: 32.3 g/dL (ref 30.0–36.0)
MCV: 94.1 fL (ref 80.0–100.0)
Monocytes Absolute: 0.5 10*3/uL (ref 0.1–1.0)
Monocytes Relative: 5 %
Neutro Abs: 9.3 10*3/uL — ABNORMAL HIGH (ref 1.7–7.7)
Neutrophils Relative %: 80 %
Platelets: 282 10*3/uL (ref 150–400)
RBC: 4.57 MIL/uL (ref 4.22–5.81)
RDW: 13 % (ref 11.5–15.5)
WBC: 11.7 10*3/uL — ABNORMAL HIGH (ref 4.0–10.5)
nRBC: 0 % (ref 0.0–0.2)

## 2018-07-12 LAB — SARS CORONAVIRUS 2 BY RT PCR (HOSPITAL ORDER, PERFORMED IN ~~LOC~~ HOSPITAL LAB): SARS Coronavirus 2: NEGATIVE

## 2018-07-12 LAB — TROPONIN I: Troponin I: 0.04 ng/mL (ref <0.03)

## 2018-07-12 LAB — BRAIN NATRIURETIC PEPTIDE: B Natriuretic Peptide: 868.7 pg/mL — ABNORMAL HIGH (ref 0.0–100.0)

## 2018-07-12 MED ORDER — FUROSEMIDE 10 MG/ML IJ SOLN
80.0000 mg | Freq: Once | INTRAMUSCULAR | Status: AC
  Administered 2018-07-12: 80 mg via INTRAVENOUS
  Filled 2018-07-12: qty 8

## 2018-07-12 MED ORDER — SODIUM CHLORIDE 0.9% FLUSH
3.0000 mL | Freq: Two times a day (BID) | INTRAVENOUS | Status: DC
  Administered 2018-07-13 – 2018-07-17 (×9): 3 mL via INTRAVENOUS

## 2018-07-12 MED ORDER — ONDANSETRON HCL 4 MG/2ML IJ SOLN: 4.0000 mg | Freq: Four times a day (QID) | INTRAMUSCULAR | Status: DC | PRN

## 2018-07-12 MED ORDER — SODIUM CHLORIDE 0.9% FLUSH: 3.0000 mL | INTRAVENOUS | Status: DC | PRN

## 2018-07-12 MED ORDER — SODIUM CHLORIDE 0.9 % IV SOLN: 250.0000 mL | INTRAVENOUS | Status: DC | PRN

## 2018-07-12 MED ORDER — ACETAMINOPHEN 325 MG PO TABS: 650.0000 mg | ORAL_TABLET | ORAL | Status: DC | PRN

## 2018-07-12 MED ORDER — FUROSEMIDE 10 MG/ML IJ SOLN: 80.0000 mg | Freq: Every day | INTRAMUSCULAR | Status: DC

## 2018-07-12 MED ORDER — ENOXAPARIN SODIUM 40 MG/0.4ML ~~LOC~~ SOLN
40.0000 mg | SUBCUTANEOUS | Status: DC
  Administered 2018-07-13 – 2018-07-17 (×5): 40 mg via SUBCUTANEOUS
  Filled 2018-07-12 (×5): qty 0.4

## 2018-07-12 NOTE — Plan of Care (Signed)
33 yo M with ongoing cocaine abuse, recurrent episodes of CHF, usually able to be diuresed in ED and sent home.  Today has good UOP but still SOB after Lasix.  BNP again over 800 and Trop of 0.10 (his usual) claims last cocaine use was last week.  Putting in for tele obs.  Strongly urged them to double check with patient when care link arrives and if at all better consider discharge rather than sending in to hospital due to risk of nosocomial COVID.  Not requiring oxygen.  No fever, but COVID-19 test was ordered so technically he becomes a PUI until he tests negative.

## 2018-07-12 NOTE — ED Triage Notes (Signed)
Pt c/o increased SOB x 3 days  HX CHF

## 2018-07-12 NOTE — ED Notes (Signed)
ED Provider at bedside. 

## 2018-07-12 NOTE — ED Notes (Signed)
Pt spouse updated with plan of care.

## 2018-07-12 NOTE — ED Notes (Signed)
Pt ambulatory to BR- unable to measure output

## 2018-07-12 NOTE — ED Notes (Signed)
Pt reports possible fevers last night. Reports waking up in sweats and having chills. Pt denies chest pain. Reports SOB that has gotten worse over the last few days. Pt reports dyspnea w/ exertion. Pt able to talk in complete sentences during assessment. Pt reports smoking hx and chf hx.

## 2018-07-12 NOTE — ED Provider Notes (Signed)
[facility] EMERGENCY DEPARTMENT Provider Note   CSN: [id number] Arrival date & time: 07/12/18  1506    History   Chief Complaint Chief Complaint  Patient presents with  . Shortness of Breath    HPI Carlos Brewer. is a 33 y.o. male.     Patient is a 34 year old male with a history of CHF and smoking history and cocaine abuse who presents with shortness of breath.  He states over the last 3 to 4 days has had increasing shortness of breath.  He denies any leg swelling but states he never gets leg swelling with his CHF exacerbations.  He feels a little fatigued.  He has a cough which is mostly dry.  He denies any chest pain or tightness.  No known fevers.  No URI symptoms.  Lately he has been using a friend's nebulizer machine because he he feels like it helps although it has not been helping over the last couple days.  He states previously he was given albuterol MDI but he is out of it.  He denies any known history of asthma or COPD although he is a smoker.  He quit smoking 2 days ago he says.  He states he has not missed any doses of his Lasix although he has not taken it today.     Past Medical History:  Diagnosis Date  . CHF (congestive heart failure) ([place])   . Cocaine abuse ([place])   . HFrEF (heart failure with reduced ejection fraction) ([place])    a. 10/2016 Echo: EF 15-20%, Gr2 DD, mildly dil LA/RA.  Marland Kitchen History of cardiac cath    a. 10/2016 Cath: LM nl, LAD min irregs, LCX nl, RCA nl, EF 15%.  . Morbid obesity ([place])   . Myocarditis ([place])    a. 10/2016 Admit w/ CHF and trop elevation; b. 10/2016 Echo: EF 15-20%; c. 10/2016 Cath: Min irregs in LAD otw nl cors, EF 15%, glob HK.  Marland Kitchen NICM (nonischemic cardiomyopathy) ([place])    a. 10/2016 Echo: EF 15-20%, Gr2 DD; b. 10/2016 Cath: Nl cors.  . Palpitations   . Tobacco abuse     Patient Active Problem List   Diagnosis Date Noted  . Acute on chronic systolic CHF (congestive heart failure) ([place]) 07/12/2018  . Morbid  obesity ([place]) 02/07/2017  . Nonischemic cardiomyopathy ([place]) 11/07/2016  . Chronic systolic heart failure ([place]) 11/05/2016  . Anxiety 11/05/2016  . Substance use disorder 11/05/2016  . Pneumonia 10/24/2016    Past Surgical History:  Procedure Laterality Date  . CORONARY ANGIOPLASTY    . RIGHT/LEFT HEART CATH AND CORONARY ANGIOGRAPHY N/A 10/27/2016   Procedure: RIGHT/LEFT HEART CATH AND CORONARY ANGIOGRAPHY;  Surgeon: Wellington Hampshire, MD;  Location: [facility];  Service: Cardiovascular;  Laterality: N/A;  . WISDOM TOOTH EXTRACTION  2008        Home Medications    Prior to Admission medications   Medication Sig Start Date End Date Taking? Authorizing Provider  acetaminophen (TYLENOL) 500 MG tablet Take 2 tablets (1,000 mg total) by mouth every 6 (six) hours as needed. Patient taking differently: Take 1,000 mg by mouth every 6 (six) hours as needed for mild pain or fever.  10/12/16   Betancourt, Aura Fey, NP  albuterol (PROVENTIL HFA;VENTOLIN HFA) 108 (90 Base) MCG/ACT inhaler Inhale 1-2 puffs into the lungs every 6 (six) hours as needed for wheezing or shortness of breath. 08/31/17   Coral Spikes, DO  aspirin EC 81 MG EC  tablet Take 1 tablet (81 mg total) by mouth daily. 10/30/16   Demetrios Loll, MD  atorvastatin (LIPITOR) 40 MG tablet Take 1 tablet (40 mg total) by mouth daily at 6 PM. 10/29/16   Demetrios Loll, MD  carvedilol (COREG) 3.125 MG tablet Take 1 tablet (3.125 mg total) by mouth 2 (two) times daily with a meal. 10/29/16   Demetrios Loll, MD  famotidine (PEPCID) 20 MG tablet Take 1 tablet (20 mg total) by mouth 2 (two) times daily. 02/04/18   Earleen Newport, MD  oxyCODONE-acetaminophen (PERCOCET) 5-325 MG tablet Take 1 tablet by mouth every 8 (eight) hours as needed. 02/04/18   Earleen Newport, MD  sacubitril-valsartan (ENTRESTO) 24-26 MG Take 1 tablet by mouth 2 (two) times daily. 12/05/16   Minna Merritts, MD  sertraline (ZOLOFT) 100 MG tablet TK 1 T PO QD 11/12/16    [provider]  spironolactone (ALDACTONE) 25 MG tablet Take 25 mg by mouth daily.    [provider]  torsemide (DEMADEX) 20 MG tablet Take 2 tablets (40 mg total) by mouth 2 (two) times daily. 11/07/16   Minna Merritts, MD  traZODone (DESYREL) 50 MG tablet Take 1 tablet (50 mg total) by mouth at bedtime. 10/29/16   Demetrios Loll, MD    Family History Family History  Problem Relation Age of Onset  . Healthy Mother   . Healthy Father     Social History Social History   Tobacco Use  . Smoking status: Current Every Day Smoker    Packs/day: 0.50    Types: Cigarettes    Last attempt to quit: 10/23/2016    Years since quitting: 1.7  . Smokeless tobacco: Never Used  Substance Use Topics  . Alcohol use: Yes  . Drug use: Not Currently    Types: Cocaine    Comment: quit 10/23/2016      Allergies   Patient has no known allergies.   Review of Systems Review of Systems  Constitutional: Positive for fatigue. Negative for chills, diaphoresis and fever.  HENT: Negative for congestion, rhinorrhea and sneezing.   Eyes: Negative.   Respiratory: Positive for cough and shortness of breath. Negative for chest tightness.   Cardiovascular: Negative for chest pain and leg swelling.  Gastrointestinal: Negative for abdominal pain, blood in stool, diarrhea, nausea and vomiting.  Genitourinary: Negative for difficulty urinating, flank pain, frequency and hematuria.  Musculoskeletal: Negative for arthralgias and back pain.  Skin: Negative for rash.  Neurological: Negative for dizziness, speech difficulty, weakness, numbness and headaches.     Physical Exam Updated Vital Signs BP (!) 115/91   Pulse (!) 106   Temp 97.9 F (36.6 C) (Oral)   Resp 19   Ht 5\' 11"  (1.803 m)   Wt 127 kg   SpO2 98%   BMI 39.05 kg/m   Physical Exam Constitutional:      Appearance: He is well-developed.  HENT:     Head: Normocephalic and atraumatic.  Eyes:     Pupils: Pupils are equal,  round, and reactive to light.  Neck:     Musculoskeletal: Normal range of motion and neck supple.  Cardiovascular:     Rate and Rhythm: Normal rate and regular rhythm.     Heart sounds: Normal heart sounds.  Pulmonary:     Effort: Pulmonary effort is normal. No respiratory distress.     Breath sounds: Normal breath sounds. No wheezing or rales.  Chest:     Chest wall: No tenderness.  Abdominal:     General: Bowel sounds are normal.     Palpations: Abdomen is soft.     Tenderness: There is no abdominal tenderness. There is no guarding or rebound.  Musculoskeletal: Normal range of motion.     Right lower leg: No edema.     Left lower leg: No edema.     Comments: No edema or calf tenderness  Lymphadenopathy:     Cervical: No cervical adenopathy.  Skin:    General: Skin is warm and dry.     Findings: No rash.  Neurological:     Mental Status: He is alert and oriented to person, place, and time.      ED Treatments / Results  Labs (all labs ordered are listed, but only abnormal results are displayed) Labs Reviewed  BASIC METABOLIC PANEL - Abnormal; Notable for the following components:      Result Value   Glucose, Bld 164 (*)    Creatinine, Ser 1.26 (*)    All other components within normal limits  CBC WITH DIFFERENTIAL/PLATELET - Abnormal; Notable for the following components:   WBC 11.7 (*)    Neutro Abs 9.3 (*)    All other components within normal limits  BRAIN NATRIURETIC PEPTIDE - Abnormal; Notable for the following components:   B Natriuretic Peptide 868.7 (*)    All other components within normal limits  TROPONIN I - Abnormal; Notable for the following components:   Troponin I 0.04 (*)    All other components within normal limits  SARS CORONAVIRUS 2 (HOSPITAL ORDER, [facility])    EKG EKG Interpretation  Date/Time:  Monday July 12 2018 15:14:09 EDT Ventricular Rate:  111 PR Interval:  192 QRS Duration: 112 QT Interval:  332 QTC  Calculation: 451 R Axis:   116 Text Interpretation:  Sinus tachycardia Biatrial enlargement Right axis deviation Anterior infarct , age undetermined Abnormal ECG since last tracing no significant change Confirmed by Malvin Johns [phone]) on 07/12/2018 3:38:06 PM   Radiology Dg Chest 2 View  Result Date: 07/12/2018 CLINICAL DATA:  33 year old male with shortness of breath EXAM: CHEST - 2 VIEW COMPARISON:  06/13/2018, 04/10/2018, CT 04/10/2018 FINDINGS: Cardiomediastinal silhouette unchanged in size and contour. Fullness in the central vasculature with peribronchial thickening. Reticular opacities. No evidence of pleural effusion or pneumothorax. No displaced fracture IMPRESSION: Ill-defined reticular opacities bilaterally, may represent early pulmonary edema, or potentially infection. Correlation with lab values may be useful. Cardiomegaly Electronically Signed   By: Corrie Mckusick D.O.   On: 07/12/2018 16:05    Procedures Procedures (including critical care time)  Medications Ordered in ED Medications  furosemide (LASIX) injection 80 mg (80 mg Intravenous Given 07/12/18 1737)     Initial Impression / Assessment and Plan / ED Course  I have reviewed the triage vital signs and the nursing notes.  Pertinent labs & imaging results that were available during my care of the patient were reviewed by me and considered in my medical decision making (see chart for details).       Patient is a 33 year old male with a history of CHF who presents with shortness of breath.  He has a nonproductive cough.  No fever.  His chest x-ray shows bilateral pulmonary edema versus infection.  His BNP is markedly elevated at 800.  His troponin is mildly elevated but similar to prior values that he has had during CHF exacerbations.  He does not report any chest pain.  He has no  ischemic changes on EKG.  He did have a negative COVID test.  His creatinine is mildly elevated but similar to prior values.  He was given 80  mg of Lasix and has diuresed about 500 to 700 cc of clear urine.  He feels a little bit better but still feels short of breath.  He still little tachypneic and still tachycardic with a heart rate around 110.  Given his ongoing symptoms, I will consult the hospitalist for admission for further diuresis.  I spoke with Dr. Alcario Drought who will admit the pt.  Final Clinical Impressions(s) / ED Diagnoses   Final diagnoses:  Congestive heart failure, unspecified HF chronicity, unspecified heart failure type [facility])    ED Discharge Orders    None       Malvin Johns, MD 07/12/18 2200

## 2018-07-12 NOTE — ED Notes (Signed)
[facility] arrived to [facility] 

## 2018-07-12 NOTE — ED Notes (Signed)
Called [facility] - Advised that bed was ready  [facility] 3E09   Was told that all trucks were out and it would be a while.

## 2018-07-12 NOTE — ED Notes (Signed)
Patient transported to X-ray 

## 2018-07-13 ENCOUNTER — Observation Stay (HOSPITAL_BASED_OUTPATIENT_CLINIC_OR_DEPARTMENT_OTHER): Payer: Commercial Managed Care - PPO

## 2018-07-13 ENCOUNTER — Encounter (HOSPITAL_COMMUNITY): Payer: Self-pay | Admitting: General Practice

## 2018-07-13 DIAGNOSIS — I5023 Acute on chronic systolic (congestive) heart failure: Secondary | ICD-10-CM | POA: Diagnosis present

## 2018-07-13 DIAGNOSIS — Z9861 Coronary angioplasty status: Secondary | ICD-10-CM | POA: Diagnosis not present

## 2018-07-13 DIAGNOSIS — I340 Nonrheumatic mitral (valve) insufficiency: Secondary | ICD-10-CM

## 2018-07-13 DIAGNOSIS — Z79899 Other long term (current) drug therapy: Secondary | ICD-10-CM | POA: Diagnosis not present

## 2018-07-13 DIAGNOSIS — G4733 Obstructive sleep apnea (adult) (pediatric): Secondary | ICD-10-CM | POA: Diagnosis present

## 2018-07-13 DIAGNOSIS — I428 Other cardiomyopathies: Secondary | ICD-10-CM | POA: Diagnosis present

## 2018-07-13 DIAGNOSIS — F1920 Other psychoactive substance dependence, uncomplicated: Secondary | ICD-10-CM | POA: Diagnosis not present

## 2018-07-13 DIAGNOSIS — E876 Hypokalemia: Secondary | ICD-10-CM | POA: Diagnosis present

## 2018-07-13 DIAGNOSIS — N182 Chronic kidney disease, stage 2 (mild): Secondary | ICD-10-CM | POA: Diagnosis present

## 2018-07-13 DIAGNOSIS — I371 Nonrheumatic pulmonary valve insufficiency: Secondary | ICD-10-CM

## 2018-07-13 DIAGNOSIS — I509 Heart failure, unspecified: Secondary | ICD-10-CM | POA: Diagnosis present

## 2018-07-13 DIAGNOSIS — E669 Obesity, unspecified: Secondary | ICD-10-CM | POA: Diagnosis not present

## 2018-07-13 DIAGNOSIS — Z8249 Family history of ischemic heart disease and other diseases of the circulatory system: Secondary | ICD-10-CM | POA: Diagnosis not present

## 2018-07-13 DIAGNOSIS — Z8701 Personal history of pneumonia (recurrent): Secondary | ICD-10-CM | POA: Diagnosis not present

## 2018-07-13 DIAGNOSIS — Z7982 Long term (current) use of aspirin: Secondary | ICD-10-CM | POA: Diagnosis not present

## 2018-07-13 DIAGNOSIS — F1420 Cocaine dependence, uncomplicated: Secondary | ICD-10-CM | POA: Diagnosis present

## 2018-07-13 DIAGNOSIS — Z20828 Contact with and (suspected) exposure to other viral communicable diseases: Secondary | ICD-10-CM | POA: Diagnosis present

## 2018-07-13 DIAGNOSIS — Z6839 Body mass index (BMI) 39.0-39.9, adult: Secondary | ICD-10-CM | POA: Diagnosis not present

## 2018-07-13 DIAGNOSIS — K08409 Partial loss of teeth, unspecified cause, unspecified class: Secondary | ICD-10-CM | POA: Diagnosis present

## 2018-07-13 LAB — BASIC METABOLIC PANEL
Anion gap: 10 (ref 5–15)
BUN: 13 mg/dL (ref 6–20)
CO2: 24 mmol/L (ref 22–32)
Calcium: 9 mg/dL (ref 8.9–10.3)
Chloride: 104 mmol/L (ref 98–111)
Creatinine, Ser: 1.31 mg/dL — ABNORMAL HIGH (ref 0.61–1.24)
GFR calc Af Amer: >60 mL/min (ref >60)
GFR calc non Af Amer: >60 mL/min (ref >60)
Glucose, Bld: 135 mg/dL — ABNORMAL HIGH (ref 70–99)
Potassium: 3.1 mmol/L — ABNORMAL LOW (ref 3.5–5.1)
Sodium: 138 mmol/L (ref 135–145)

## 2018-07-13 LAB — RAPID URINE DRUG SCREEN, HOSP PERFORMED
Amphetamines: NOT DETECTED
Barbiturates: NOT DETECTED
Benzodiazepines: NOT DETECTED
Cocaine: NOT DETECTED
Opiates: NOT DETECTED
Tetrahydrocannabinol: NOT DETECTED

## 2018-07-13 LAB — HIV ANTIBODY (ROUTINE TESTING W REFLEX): HIV Screen 4th Generation wRfx: NONREACTIVE

## 2018-07-13 LAB — ECHOCARDIOGRAM COMPLETE
Height: 72 in
Weight: 4504 oz

## 2018-07-13 MED ORDER — FUROSEMIDE 10 MG/ML IJ SOLN
80.0000 mg | Freq: Two times a day (BID) | INTRAMUSCULAR | Status: DC
  Administered 2018-07-13 – 2018-07-16 (×7): 80 mg via INTRAVENOUS
  Filled 2018-07-13 (×7): qty 8

## 2018-07-13 MED ORDER — PNEUMOCOCCAL VAC POLYVALENT 25 MCG/0.5ML IJ INJ
0.5000 mL | INJECTION | INTRAMUSCULAR | Status: DC
  Filled 2018-07-13: qty 0.5

## 2018-07-13 MED ORDER — ACETAMINOPHEN 500 MG PO TABS: 1000.0000 mg | ORAL_TABLET | Freq: Four times a day (QID) | ORAL | Status: DC | PRN

## 2018-07-13 MED ORDER — POTASSIUM CHLORIDE CRYS ER 20 MEQ PO TBCR
20.0000 meq | EXTENDED_RELEASE_TABLET | Freq: Every day | ORAL | Status: DC
  Administered 2018-07-13 – 2018-07-17 (×5): 20 meq via ORAL
  Filled 2018-07-13 (×5): qty 1

## 2018-07-13 MED ORDER — SPIRONOLACTONE 25 MG PO TABS
25.0000 mg | ORAL_TABLET | Freq: Every day | ORAL | Status: DC
  Administered 2018-07-13 – 2018-07-17 (×5): 25 mg via ORAL
  Filled 2018-07-13 (×5): qty 1

## 2018-07-13 MED ORDER — FAMOTIDINE 20 MG PO TABS
20.0000 mg | ORAL_TABLET | Freq: Two times a day (BID) | ORAL | Status: DC
  Administered 2018-07-13 – 2018-07-17 (×9): 20 mg via ORAL
  Filled 2018-07-13 (×9): qty 1

## 2018-07-13 MED ORDER — TRAZODONE HCL 50 MG PO TABS
50.0000 mg | ORAL_TABLET | Freq: Every day | ORAL | Status: DC
  Administered 2018-07-13 – 2018-07-16 (×5): 50 mg via ORAL
  Filled 2018-07-13 (×5): qty 1

## 2018-07-13 MED ORDER — POTASSIUM CHLORIDE CRYS ER 20 MEQ PO TBCR
40.0000 meq | EXTENDED_RELEASE_TABLET | Freq: Once | ORAL | Status: AC
  Administered 2018-07-13: 40 meq via ORAL
  Filled 2018-07-13: qty 2

## 2018-07-13 MED ORDER — OXYCODONE-ACETAMINOPHEN 5-325 MG PO TABS
1.0000 | ORAL_TABLET | Freq: Three times a day (TID) | ORAL | Status: DC | PRN
  Administered 2018-07-13 – 2018-07-16 (×4): 1 via ORAL
  Filled 2018-07-13 (×4): qty 1

## 2018-07-13 MED ORDER — SERTRALINE HCL 100 MG PO TABS
100.0000 mg | ORAL_TABLET | Freq: Every day | ORAL | Status: DC
  Administered 2018-07-13 – 2018-07-17 (×5): 100 mg via ORAL
  Filled 2018-07-13 (×5): qty 1

## 2018-07-13 MED ORDER — ASPIRIN EC 81 MG PO TBEC
81.0000 mg | DELAYED_RELEASE_TABLET | Freq: Every day | ORAL | Status: DC
  Administered 2018-07-13 – 2018-07-17 (×5): 81 mg via ORAL
  Filled 2018-07-13 (×5): qty 1

## 2018-07-13 MED ORDER — DIGOXIN 125 MCG PO TABS
0.1250 mg | ORAL_TABLET | Freq: Every day | ORAL | Status: DC
  Administered 2018-07-13 – 2018-07-17 (×5): 0.125 mg via ORAL
  Filled 2018-07-13 (×5): qty 1

## 2018-07-13 MED ORDER — CARVEDILOL 3.125 MG PO TABS
3.1250 mg | ORAL_TABLET | Freq: Two times a day (BID) | ORAL | Status: DC
  Administered 2018-07-13 (×2): 3.125 mg via ORAL
  Filled 2018-07-13 (×2): qty 1

## 2018-07-13 MED ORDER — SACUBITRIL-VALSARTAN 24-26 MG PO TABS
1.0000 | ORAL_TABLET | Freq: Two times a day (BID) | ORAL | Status: DC
  Administered 2018-07-13 – 2018-07-17 (×9): 1 via ORAL
  Filled 2018-07-13 (×9): qty 1

## 2018-07-13 MED ORDER — ATORVASTATIN CALCIUM 40 MG PO TABS
40.0000 mg | ORAL_TABLET | Freq: Every day | ORAL | Status: DC
  Administered 2018-07-13 – 2018-07-16 (×4): 40 mg via ORAL
  Filled 2018-07-13 (×4): qty 1

## 2018-07-13 MED ORDER — ALBUTEROL SULFATE (2.5 MG/3ML) 0.083% IN NEBU: 3.0000 mL | INHALATION_SOLUTION | Freq: Four times a day (QID) | RESPIRATORY_TRACT | Status: DC | PRN

## 2018-07-13 NOTE — Plan of Care (Signed)
  Problem: Activity: Goal: Risk for activity intolerance will decrease Outcome: Progressing   Problem: Coping: Goal: Level of anxiety will decrease Outcome: Progressing   Problem: Elimination: Goal: Will not experience complications related to bowel motility Outcome: Progressing   

## 2018-07-13 NOTE — Progress Notes (Signed)
Nutrition Education Note  RD working remotely.  RD consulted for nutrition education regarding new onset CHF.  RD attempted to speak with pt on phone, however, call was not answered when placing call. Phone message reported "call cannot be completed as dialed".  RD provided "Low Sodium Nutrition Therapy" and "Sodium Free Seasoning Tips" handouts from the Academy of Nutrition and Dietetics. RD attached these handouts to after visit summary/ discharge instructions for pt to refer to after discharge.   Body mass index is 38.18 kg/m. Patient meets criteria for obesity, class II based on current BMI.   Current diet order is Heart Healthy, patient is consuming approximately 100% of meals at this time. Labs and medications reviewed.   No nutrition interventions warranted at this time. If nutrition issues arise, please consult RD.   Miki Labuda A. Jimmye Norman, RD, LDN, [place] Registered Dietitian II Certified Diabetes Care and Education Specialist Pager: [phone] After hours Pager: [phone]

## 2018-07-13 NOTE — Progress Notes (Signed)
Patient seen and examined personally, I reviewed the chart, history and physical and admission note, done by admitting physician this morning and agree with the same with following addendum.  Please refer to the morning admission note for more detailed plan of care.  Briefly, 33 year old male with hx of cocaine abuse, morbid obesity with BMI 38, systolic NICM CHF with EF 15-20% p/w 3-4 days  Of ncreasing shortness of breath, dry cough , quit smoking 2 days ago taking Bumex in QG:BEEF 0.04, BNP 868, no fever, WBC 11.7k.  BNP usually runs 700-800.  He was admitted for acute CHF exacerbation.  COVID 19 was negative  This Morning reports he still feels the same, no leg swelling but has some shortness of breath.Has mild dry cough but no chest pain nausea vomiting abdominal pain.  Continue on IV diuretics Lasix 80 mg twice daily, Aldactone, Entresto, Coreg.  Follow-up echocardiogram, if his EF is here is 10 to 15%,consulted cardiology for eval. Patient reports dietary indiscretion, does not watch his fluid or salt intake but reports he is compliant with his medication.  Hypokalemia at 3.1, will replete with 60 KCl today and continue 20 KCl daily while on lasix.

## 2018-07-13 NOTE — TOC Initial Note (Signed)
Transition of Care (TOC) - Initial/Assessment Note    Patient Details  Name: Carlos Brewer. MRN: [id number] Date of Birth: 1985/07/26  Transition of Care [facility]) CM/SW Contact:    Sherrilyn Rist Phone Number: [phone] 07/13/2018, 10:51 AM  Clinical Narrative:                 Patient lives at home with spouse; goes to the [facility] for medical care and medication; back up pharmacy is [facility]; patient is independent of all of his ADL's, works full time. He states that he eats out a lot and endorses sodium in the diet. Nutritional Consult placed; has scales and knows to weigh himself daily; CM also talked to patient about smoking, ETOH and cocaine use; he stated that he would stop. CM encouraged him to talk to his PCP at the [facility] for additional support / resources available. Lots of encouragement given.   Expected Discharge Plan: Home/Self Care Barriers to Discharge: No Barriers Identified   Patient Goals and CMS Choice Patient states their goals for this hospitalization and ongoing recovery are:: to breathe better CMS Medicare.gov Compare Post Acute Care list provided to:: Patient Choice offered to / list presented to : NA  Expected Discharge Plan and Services Expected Discharge Plan: Home/Self Care In-house Referral: NA Discharge Planning Services: CM Consult Post Acute Care Choice: NA Living arrangements for the past 2 months: Single Family Home                 DME Arranged: N/A DME Agency: NA       HH Arranged: NA HH Agency: NA        Prior Living Arrangements/Services Living arrangements for the past 2 months: Single Family Home Lives with:: Spouse Patient language and need for interpreter reviewed:: Yes Do you feel safe going back to the place where you live?: Yes      Need for Family Participation in Patient Care: No (Comment) Care giver support system in place?: Yes (comment)   Criminal Activity/Legal Involvement Pertinent to Current  Situation/Hospitalization: No - Comment as needed  Activities of Daily Living Home Assistive Devices/Equipment: CPAP ADL Screening (condition at time of admission) Patient's cognitive ability adequate to safely complete daily activities?: Yes Is the patient deaf or have difficulty hearing?: No Does the patient have difficulty seeing, even when wearing glasses/contacts?: No Does the patient have difficulty concentrating, remembering, or making decisions?: No Patient able to express need for assistance with ADLs?: Yes Does the patient have difficulty dressing or bathing?: No Independently performs ADLs?: Yes (appropriate for developmental age) Does the patient have difficulty walking or climbing stairs?: No Weakness of Legs: None Weakness of Arms/Hands: None  Permission Sought/Granted Permission sought to share information with : Case Manager Permission granted to share information with : Yes, Verbal Permission Granted  Share Information with NAME: wife [place]           Emotional Assessment Appearance:: Developmentally appropriate Attitude/Demeanor/Rapport: Gracious, Engaged Affect (typically observed): Accepting Orientation: : Oriented to Self, Oriented to  Time, Oriented to Place, Oriented to Situation Alcohol / Substance Use: Not Applicable Psych Involvement: No (comment)  Admission diagnosis:  Congestive heart failure, unspecified HF chronicity, unspecified heart failure type [facility]) [I50.9] Patient Active Problem List   Diagnosis Date Noted  . Acute on chronic systolic CHF (congestive heart failure) ([place]) 07/12/2018  . Morbid obesity ([place]) 02/07/2017  . Nonischemic cardiomyopathy ([place]) 11/07/2016  . Chronic systolic heart failure ([place]) 11/05/2016  . Anxiety 11/05/2016  .  Substance use disorder 11/05/2016  . Pneumonia 10/24/2016   PCP:  Patient, No Pcp Per Pharmacy:   [id number] [address] [place] [address] Phone: [phone] Fax: [phone]  [facility] [place] [place] [address] Phone: [phone] Fax: [phone]     Social Determinants of Health (SDOH) Interventions    Readmission Risk Interventions No flowsheet data found.

## 2018-07-13 NOTE — Consult Note (Addendum)
Cardiology Consultation:   Patient ID: Oriel Rumbold.; [id number]; 02-21-1986   Admit date: 07/12/2018 Date of Consult: 07/13/2018  Primary Care Provider: Patient, No Pcp Per Primary Cardiologist: Ida Rogue, MD 11/07/2016 Primary Electrophysiologist:  None   Patient Profile:   Haydn Hutsell. is a 33 y.o. male with a hx of S-D-CHF w/ EF 15-20% in 2018 (no f/u w/ cards since), cocaine abuse, OSA on CPAP, tob abuse, obesity, acute infective myocarditis 2018, who is being seen today for the evaluation of CHF at the request of Dr Lupita Leash.  History of Present Illness:   Mr. Viana has not been seen by cards since 2018. He has been followed at the [facility] for cardiac issues.  In 2018 was admitted for respiratory failure and seen by Dr. Rockey Situ. Cath with minimal luminal irregularities. EF 15%. RA: 13 mmHg, RV: 59 over 18 mmHg, PA pressure: 51/33 with a mean of 42 mmHg, pulmonary capillary wedge pressure: 42 mmHg, left ventricular end-diastolic pressure was 47 mmHg. Cardiac output was 6.07 with a cardiac index of 2.46.  Has not followed up with Dr. Rockey Situ. Says he follows intermittently at the [facility] [place] recently stopped due to low BP. He did not feel light-headed, on side effect from the [facility] was palpitations. SBP was in the 80s, but he felt ok except for the palpitations. He was supposedly taking 24-26 mg bid but has not been taking meds routinely.   04/10/2018 ER visit for SOB and chest pain, wt 280 lbs, he had taken extra torsemide pta, good UOP in ER>>sx improved, cards f/u recommended 06/13/2018 ER visit for SOB, BNP 853, trop 0.04, was given Lasix 80 mg IV x 1, 1 L UOP>>sx improved. No wt  Over last week has had progressive IIIb-IV symptoms with bendopnea, fatigue, orthopnea/PND and dyspnea with minimal exertion. No CP  He was admitted 04/28, wt 281 lbs. He was started on Lasix 80 mg IV BID. Urine output picking up a bit.   Says he has not done cocaine in a  week. Drinks at least a 12 pack of beer and several shots on weekend days.    Past Medical History:  Diagnosis Date  . CHF (congestive heart failure) ([place])   . Cocaine abuse ([place])   . HFrEF (heart failure with reduced ejection fraction) ([place])    a. 10/2016 Echo: EF 15-20%, Gr2 DD, mildly dil LA/RA.  Marland Kitchen History of cardiac cath    a. 10/2016 Cath: LM nl, LAD min irregs, LCX nl, RCA nl, EF 15%.  . Morbid obesity ([place])   . Myocarditis ([place])    a. 10/2016 Admit w/ CHF and trop elevation; b. 10/2016 Echo: EF 15-20%; c. 10/2016 Cath: Min irregs in LAD otw nl cors, EF 15%, glob HK.  Marland Kitchen NICM (nonischemic cardiomyopathy) ([place])    a. 10/2016 Echo: EF 15-20%, Gr2 DD; b. 10/2016 Cath: Nl cors.  . Palpitations   . Sleep apnea    USES CPAP  . Tobacco abuse     Past Surgical History:  Procedure Laterality Date  . CORONARY ANGIOPLASTY    . RIGHT/LEFT HEART CATH AND CORONARY ANGIOGRAPHY N/A 10/27/2016   Procedure: RIGHT/LEFT HEART CATH AND CORONARY ANGIOGRAPHY;  Surgeon: Wellington Hampshire, MD;  Location: [place] [facility];  Service: Cardiovascular;  Laterality: N/A;  . WISDOM TOOTH EXTRACTION  2008     Prior to Admission medications   Medication Sig Start Date End Date Taking? Authorizing Provider  acetaminophen (TYLENOL) 500 MG tablet Take  2 tablets (1,000 mg total) by mouth every 6 (six) hours as needed. Patient taking differently: Take 1,000 mg by mouth every 6 (six) hours as needed for mild pain or fever.  10/12/16  Yes Betancourt, Aura Fey, NP  aspirin EC 81 MG EC tablet Take 1 tablet (81 mg total) by mouth daily. 10/30/16  Yes Demetrios Loll, MD  atorvastatin (LIPITOR) 40 MG tablet Take 1 tablet (40 mg total) by mouth daily at 6 PM. 10/29/16  Yes Demetrios Loll, MD  carvedilol (COREG) 3.125 MG tablet Take 1 tablet (3.125 mg total) by mouth 2 (two) times daily with a meal. 10/29/16  Yes Demetrios Loll, MD  famotidine (PEPCID) 20 MG tablet Take 1 tablet (20 mg total) by mouth 2 (two) times daily. 02/04/18  Yes  Earleen Newport, MD  sacubitril-valsartan (ENTRESTO) 24-26 MG Take 1 tablet by mouth 2 (two) times daily. 12/05/16  Yes Minna Merritts, MD  sertraline (ZOLOFT) 100 MG tablet Take 100 mg by mouth daily.  11/12/16  Yes [provider]  spironolactone (ALDACTONE) 25 MG tablet Take 25 mg by mouth daily.   Yes [provider]  torsemide (DEMADEX) 20 MG tablet Take 2 tablets (40 mg total) by mouth 2 (two) times daily. 11/07/16  Yes Minna Merritts, MD  traZODone (DESYREL) 50 MG tablet Take 1 tablet (50 mg total) by mouth at bedtime. 10/29/16  Yes Demetrios Loll, MD  albuterol (PROVENTIL HFA;VENTOLIN HFA) 108 (90 Base) MCG/ACT inhaler Inhale 1-2 puffs into the lungs every 6 (six) hours as needed for wheezing or shortness of breath. Patient not taking: Reported on 07/13/2018 08/31/17   Coral Spikes, DO    Inpatient Medications: Scheduled Meds: . aspirin EC  81 mg Oral Daily  . atorvastatin  40 mg Oral q1800  . carvedilol  3.125 mg Oral BID WC  . enoxaparin (LOVENOX) injection  40 mg Subcutaneous Q24H  . famotidine  20 mg Oral BID  . furosemide  80 mg Intravenous BID  . [START ON 07/14/2018] pneumococcal 23 valent vaccine  0.5 mL Intramuscular Tomorrow-1000  . potassium chloride  20 mEq Oral Daily  . sacubitril-valsartan  1 tablet Oral BID  . sertraline  100 mg Oral Daily  . sodium chloride flush  3 mL Intravenous Q12H  . spironolactone  25 mg Oral Daily  . traZODone  50 mg Oral QHS   Continuous Infusions: . sodium chloride     PRN Meds: sodium chloride, acetaminophen, albuterol, ondansetron (ZOFRAN) IV, oxyCODONE-acetaminophen, sodium chloride flush  Allergies:   No Known Allergies  Social History:   Social History   Socioeconomic History  . Marital status: Single    Spouse name: Not on file  . Number of children: Not on file  . Years of education: Not on file  . Highest education level: Not on file  Occupational History  . Occupation: switches out  Electronics engineer: [employer]  . Financial resource strain: Not on file  . Food insecurity:    Worry: Not on file    Inability: Not on file  . Transportation needs:    Medical: Not on file    Non-medical: Not on file  Tobacco Use  . Smoking status: Current Every Day Smoker    Packs/day: 0.50    Types: Cigarettes  . Smokeless tobacco: Never Used  Substance and Sexual Activity  . Alcohol use: Yes  . Drug use: Not Currently    Types: Cocaine  Comment: quit 10/23/2016   . Sexual activity: Yes    Birth control/protection: None  Lifestyle  . Physical activity:    Days per week: Not on file    Minutes per session: Not on file  . Stress: Not on file  Relationships  . Social connections:    Talks on phone: Not on file    Gets together: Not on file    Attends religious service: Not on file    Active member of club or organization: Not on file    Attends meetings of clubs or organizations: Not on file    Relationship status: Not on file  . Intimate partner violence:    Fear of current or ex partner: Not on file    Emotionally abused: Not on file    Physically abused: Not on file    Forced sexual activity: Not on file  Other Topics Concern  . Not on file  Social History Narrative   ** Merged History Encounter **       Lives in [place] w/ girlfriend.  He does not routinely exercise.    Family History:   Family History  Problem Relation Age of Onset  . Healthy Mother   . Heart failure Father        EF is 35%   Family Status: No history of SCD Family Status  Relation Name Status  . Mother  Alive  . Father  Alive   Review of Systems: [y] = yes, [ ]  = no    General: Weight gain [y]; Weight loss [ ] ; Anorexia [ ] ; Fatigue [y]; Fever [ ] ; Chills [ ] ; Weakness Blue.Reese ]   Cardiac: Chest pain/pressure [ ] ; Resting SOB [ y]; Exertional SOB [y]; Orthopnea Blue.Reese ]; Pedal Edema [] ; Palpitations [ ] ; Syncope [ ] ; Presyncope [ ] ; Paroxysmal nocturnal  dyspnea[ ]    Pulmonary: Cough [ ] ; Wheezing[ ] ; Hemoptysis[ ] ; Sputum [ ] ; Snoring [ ]    GI: Vomiting[ ] ; Dysphagia[ ] ; Melena[ ] ; Hematochezia [ ] ; Heartburn[ ] ; Abdominal pain [ ] ; Constipation [ ] ; Diarrhea [ ] ; BRBPR [ ]    GU: Hematuria[ ] ; Dysuria [ ] ; Nocturia[ ]   Vascular: Pain in legs with walking [ ] ; Pain in feet with lying flat [ ] ; Non-healing sores [ ] ; Stroke [ ] ; TIA [ ] ; Slurred speech [ ] ;   Neuro: Headaches[ ] ; Vertigo[ ] ; Seizures[ ] ; Paresthesias[ ] ;Blurred vision [ ] ; Diplopia [ ] ; Vision changes [ ]    Ortho/Skin: Arthritis [ ] ; Joint pain [ ] ; Muscle pain [ ] ; Joint swelling [ ] ; Back Pain [ ] ; Rash [ ]    Psych: Depression[ ] ; Anxiety[ ]    Heme: Bleeding problems [ ] ; Clotting disorders [ ] ; Anemia [ ]    Endocrine: Diabetes [ ] ; Thyroid dysfunction[ ]    Physical Exam/Data:   Vitals:   07/12/18 2254 07/13/18 0517 07/13/18 0849 07/13/18 1243  BP: (!) 119/97 (!) 107/94 101/68 (!) 95/59  Pulse: (!) 107 99 (!) 102 97  Resp: 20 18  18   Temp: 98.3 F (36.8 C) 98.7 F (37.1 C) 97.8 F (36.6 C) 98.3 F (36.8 C)  TempSrc: Oral Oral Oral Oral  SpO2: 100% (!) 89% 95% 90%  Weight: 127.3 kg 127.7 kg    Height: 6' (1.829 m)       Intake/Output Summary (Last 24 hours) at 07/13/2018 1550 Last data filed at 07/13/2018 1246 Gross per 24 hour  Intake 480 ml  Output 1500 ml  Net -1020 ml  Filed Weights   07/12/18 1513 07/12/18 2254 07/13/18 0517  Weight: 127 kg 127.3 kg 127.7 kg   Body mass index is 38.18 kg/m.  General:  Well nourished, well developed, in no acute distress HEENT: normal Lymph: no adenopathy Neck: JVD approx 10 cm but difficult to assess 2nd body habitus Endocrine:  No thryomegaly Vascular: No carotid bruits; 4/4 extremity pulses 2+, without bruits  Cardiac:  normal S1, S2; RRR; no murmur, +S3 Lungs: decreased BS bases w/ rales bilaterally, no wheezing, rhonchi    Abd: soft, nontender, no hepatomegaly  Ext: trace edema  Musculoskeletal:  No deformities, BUE and BLE strength normal and equal Skin: warm and dry  Neuro:  CNs 2-12 intact, no focal abnormalities noted Psych:  Normal affect   EKG:  The EKG was personally reviewed and demonstrates:  04/27 ECG is ST, HR 111, biatrial enlargement, poor R wave progression, no sig change from 06/13/2018 Telemetry:  Telemetry was personally reviewed and demonstrates:  Generally sinus tach, minimal ectopy, sometimes HR overestimated due to large P waves  Relevant CV Studies:  ECHO: 07/13/2018  1. The left ventricle has a visually estimated ejection fraction of 10-15%. The cavity size was severely dilated. Left ventricular diastolic Doppler parameters are consistent with restrictive filling.  2. The right ventricle has normal systolic function. The cavity was normal. There is no increase in right ventricular wall thickness.  3. Left atrial size was mildly dilated.  4. Right atrial size was mildly dilated.  5. Mitral valve regurgitation is mild to moderate by color flow Doppler.  6. Aortic valve regurgitation is trivial by color flow Doppler. No stenosis of the aortic valve.  7. The inferior vena cava was dilated in size with <50% respiratory variability.  8. The interatrial septum was not assessed.  FINDINGS  Left Ventricle: The left ventricle has a visually estimated ejection fraction of 10-15. The cavity size was severely dilated. There is no increase in left ventricular wall thickness. Left ventricular diastolic Doppler parameters are consistent with  restrictive filling.  Right Ventricle: The right ventricle has normal systolic function. The cavity was normal. There is no increase in right ventricular wall thickness.  Left Atrium: Left atrial size was mildly dilated.  Right Atrium: Right atrial size was mildly dilated. Right atrial pressure is estimated at 10 mmHg.  Interatrial Septum: The interatrial septum was not assessed.  Pericardium: There is no  evidence of pericardial effusion.  Mitral Valve: The mitral valve is normal in structure. Mitral valve regurgitation is mild to moderate by color flow Doppler.  Tricuspid Valve: The tricuspid valve is normal in structure. Tricuspid valve regurgitation is trivial by color flow Doppler.  Aortic Valve: The aortic valve is normal in structure. Aortic valve regurgitation is trivial by color flow Doppler. There is No stenosis of the aortic valve, with a calculated valve area of 3.09 cm.  Pulmonic Valve: The pulmonic valve was grossly normal. Pulmonic valve regurgitation is mild by color flow Doppler.  Venous: The inferior vena cava is dilated in size with less than 50% respiratory variability.   ECHO: 10/24/2016 - Procedure narrative: Transthoracic echocardiography. The study   was technically difficult. - Left ventricle: The cavity size was moderately dilated. Wall   thickness was normal. Systolic function was severely reduced. The   estimated ejection fraction was in the range of 15% to 20%.   Features are consistent with a pseudonormal left ventricular   filling pattern, with concomitant abnormal relaxation and   increased filling pressure (  grade 2 diastolic dysfunction). - Left atrium: The atrium was mildly dilated. - Right atrium: The atrium was mildly dilated. - Pulmonary arteries: Systolic pressure could not be accurately   estimated.  CATH: 10/27/2016  There is severe left ventricular systolic dysfunction.  LV end diastolic pressure is severely elevated.  The left ventricular ejection fraction is less than 25% by visual estimate.   1. Minimal luminal irregularities with no evidence of obstructive coronary artery disease. 2. Severely reduced LV systolic function with an EF of 15% with global hypokinesis. 3. Right heart catheterization showed severely elevated filling pressures, moderate pulmonary hypertension and normal cardiac output. RA: 13 mmHg, RV: 59 over 18 mmHg,  PA pressure: 51/33 with a mean of 42 mmHg, pulmonary capillary wedge pressure: 42 mmHg, left ventricular end-diastolic pressure was 47 mmHg. Cardiac output was 6.07 with a cardiac index of 2.46.  Recommendations: The patient has nonischemic cardiomyopathy with severely reduced LV systolic function. His filling pressures are severely elevated and I suspect that his infiltrates on x-ray are all due to pulmonary edema and heart failure. The patient will require at least 2-3 days of IV diuresis. I switched him to furosemide 40 mg intravenously twice daily. Continue other heart medications.  Laboratory Data:  Chemistry Recent Labs  Lab 07/12/18 1601 07/13/18 0337  NA 136 138  K 3.5 3.1*  CL 104 104  CO2 22 24  GLUCOSE 164* 135*  BUN 14 13  CREATININE 1.26* 1.31*  CALCIUM 9.2 9.0  GFRNONAA >60 >60  GFRAA >60 >60  ANIONGAP 10 10    Lab Results  Component Value Date   ALT 46 (H) 02/04/2018   AST 38 02/04/2018   ALKPHOS 78 02/04/2018   BILITOT 0.5 02/04/2018   Hematology Recent Labs  Lab 07/12/18 1601  WBC 11.7*  RBC 4.57  HGB 13.9  HCT 43.0  MCV 94.1  MCH 30.4  MCHC 32.3  RDW 13.0  PLT 282   Cardiac Enzymes Recent Labs  Lab 07/12/18 1601  TROPONINI 0.04*     BNP Recent Labs  Lab 07/12/18 1601  BNP 868.7*    TSH:  Lab Results  Component Value Date   TSH 1.247 06/23/2016   Lipids: Lab Results  Component Value Date   CHOL 213 (H) 10/26/2016   HDL 62 10/26/2016   LDLCALC 130 (H) 10/26/2016   TRIG 106 10/26/2016   CHOLHDL 3.4 10/26/2016   HgbA1c:No results found for: HGBA1C Magnesium:  Magnesium  Date Value Ref Range Status  10/26/2016 2.1 1.7 - 2.4 mg/dL Final   Drugs of Abuse     Component Value Date/Time   LABOPIA NONE DETECTED 07/13/2018 0212   COCAINSCRNUR NONE DETECTED 07/13/2018 0212   COCAINSCRNUR NONE DETECTED 02/04/2018 1010   LABBENZ NONE DETECTED 07/13/2018 0212   AMPHETMU NONE DETECTED 07/13/2018 0212   THCU NONE DETECTED  07/13/2018 0212   LABBARB NONE DETECTED 07/13/2018 0212      Radiology/Studies:  Dg Chest 2 View  Result Date: 07/12/2018 CLINICAL DATA:  33 year old male with shortness of breath EXAM: CHEST - 2 VIEW COMPARISON:  06/13/2018, 04/10/2018, CT 04/10/2018 FINDINGS: Cardiomediastinal silhouette unchanged in size and contour. Fullness in the central vasculature with peribronchial thickening. Reticular opacities. No evidence of pleural effusion or pneumothorax. No displaced fracture IMPRESSION: Ill-defined reticular opacities bilaterally, may represent early pulmonary edema, or potentially infection. Correlation with lab values may be useful. Cardiomegaly Electronically Signed   By: Corrie Mckusick D.O.   On: 07/12/2018 16:05    Assessment  and Plan:   1. Acute on Chronic systolic (?diastolic also) CHF:  - pt is volume overloaded by exam and by history - has had Lasix 80 mg IV x 2 doses since admission, total UOP 1500 cc since admission, but only 200 cc output recorded since Lasix given this am. - consider increasing Lasix to 120 mg IV bid or q 8 h vs Lasix gtt - BP runs low, may need milrinone - may benefit from CVP and Co-Ox measurements as well. - discuss w/ MD if pt would benefit from decreasing or d/c spiro, if that would keep BP high enough to get Entresto and BB on board as outpt.   2. Cocaine use - UDS neg this admit, pt say has been a week since use - cessation discussed and encouraged  3. OSA - reports compliance with CPAP   Otherwise, per IM Principal Problem:   Acute on chronic systolic CHF (congestive heart failure) ([place])    For questions or updates, please contact [place] Please consult [email or web address] for contact info under Cardiology/STEMI.   Signed, Rosaria Ferries, PA-C  07/13/2018 3:50 PM  Patient seen and examined with the above-signed Advanced Practice Provider and/or Housestaff. I personally reviewed laboratory data, imaging studies and relevant notes. I  independently examined the patient and formulated the important aspects of the plan. I have edited the note to reflect any of my changes or salient points. I have personally discussed the plan with the patient and/or family.  33 y/o obese male with severe systolic HF due to NICM in the setting of OSA and polysubstance abuse.   Has been minimally complaint with meds and continues with substance abuse. Now admitted with Class IV symptoms and probable low output.   Echo reviewed personally and EF 10-15 (markedly dilated) with severe RV dysfunction .  On exam he has JVP to his ear Cor PMI lateral displaced. Tachy + s3 Lungs diminished at bases Ab mildly distended NT Ext warm. No edema. Multiple tattoos  Suspect he has low-output HF with volume overload likely due to ETOH/cocaine CM. Will stop carvedilol. Continue IV lasix ans spiro (can give a dose of metolazone as needed). Continue entresto as BP tolerates. Start digoxin. Supp K. If not responding well to IV lasix will need PICC line for CVP and co-ox. Long talk about high-risk for mortality and need to stop ETOH/cocaine and comply with meds. The HF team will follow.   Glori Bickers, MD  6:11 PM

## 2018-07-13 NOTE — Progress Notes (Signed)
  Echocardiogram 2D Echocardiogram has been performed.  Madelaine Etienne 07/13/2018, 10:54 AM

## 2018-07-13 NOTE — H&P (Signed)
History and Physical    Carlos Brewer. BOF:[id number] DOB: 1985/09/27 DOA: 07/12/2018  PCP: Patient, No Pcp Per  Patient coming from: Home  I have personally briefly reviewed patient's old medical records in [employer]  Chief Complaint: SOB  HPI: Carlos Brewer. is a 33 y.o. male with medical history significant of cocaine abuse, systolic NICM CHF with EF 15-20%.  Patient says last time he used cocaine was last week.  He states over the last 3 to 4 days has had increasing shortness of breath.  He denies any leg swelling but states he never gets leg swelling with his CHF exacerbations.  He feels a little fatigued.  He has a cough which is mostly dry.  He denies any chest pain or tightness.  No known fevers.  No URI symptoms.  Lately he has been using a friend's nebulizer machine because he he feels like it helps although it has not been helping over the last couple days.  He states previously he was given albuterol MDI but he is out of it.  He denies any known history of asthma or COPD although he is a smoker.  He quit smoking 2 days ago he says.  He states he has not missed any doses of his Bumex although he has not taken it today.   ED Course: Trop 0.04, BNP 868, no fever, WBC 11.7k.  BNP usually runs 700-800 historically with CHF exacerbations / presentations to the ED.  He has frequent presentations to ED with CHF, though usually responds more quickly to 80mg  IV lasix and can be discharged home (last was x1 month ago on 3/29).  Today despite given 80mg  of lasix and having "some improvement" after what sounds like well over 1L UOP.  EDP felt that he was still SOB and needed obs admit.  COVID-19 neg.   Review of Systems: As per HPI otherwise 10 point review of systems negative.   Past Medical History:  Diagnosis Date  . CHF (congestive heart failure) ([place])   . Cocaine abuse ([place])   . HFrEF (heart failure with reduced ejection fraction) ([place])    a. 10/2016  Echo: EF 15-20%, Gr2 DD, mildly dil LA/RA.  Marland Kitchen History of cardiac cath    a. 10/2016 Cath: LM nl, LAD min irregs, LCX nl, RCA nl, EF 15%.  . Morbid obesity ([place])   . Myocarditis ([place])    a. 10/2016 Admit w/ CHF and trop elevation; b. 10/2016 Echo: EF 15-20%; c. 10/2016 Cath: Min irregs in LAD otw nl cors, EF 15%, glob HK.  Marland Kitchen NICM (nonischemic cardiomyopathy) ([place])    a. 10/2016 Echo: EF 15-20%, Gr2 DD; b. 10/2016 Cath: Nl cors.  . Palpitations   . Tobacco abuse     Past Surgical History:  Procedure Laterality Date  . CORONARY ANGIOPLASTY    . RIGHT/LEFT HEART CATH AND CORONARY ANGIOGRAPHY N/A 10/27/2016   Procedure: RIGHT/LEFT HEART CATH AND CORONARY ANGIOGRAPHY;  Surgeon: Wellington Hampshire, MD;  Location: [facility];  Service: Cardiovascular;  Laterality: N/A;  . WISDOM TOOTH EXTRACTION  2008     reports that he has been smoking cigarettes. He has been smoking about 0.50 packs per day. He has never used smokeless tobacco. He reports current alcohol use. He reports previous drug use. Drug: Cocaine.  No Known Allergies  Family History  Problem Relation Age of Onset  . Healthy Mother   . Healthy Father      Prior to  Admission medications   Medication Sig Start Date End Date Taking? Authorizing Provider  acetaminophen (TYLENOL) 500 MG tablet Take 2 tablets (1,000 mg total) by mouth every 6 (six) hours as needed. Patient taking differently: Take 1,000 mg by mouth every 6 (six) hours as needed for mild pain or fever.  10/12/16   Betancourt, Aura Fey, NP  albuterol (PROVENTIL HFA;VENTOLIN HFA) 108 (90 Base) MCG/ACT inhaler Inhale 1-2 puffs into the lungs every 6 (six) hours as needed for wheezing or shortness of breath. 08/31/17   Coral Spikes, DO  aspirin EC 81 MG EC tablet Take 1 tablet (81 mg total) by mouth daily. 10/30/16   Demetrios Loll, MD  atorvastatin (LIPITOR) 40 MG tablet Take 1 tablet (40 mg total) by mouth daily at 6 PM. 10/29/16   Demetrios Loll, MD  carvedilol (COREG) 3.125 MG  tablet Take 1 tablet (3.125 mg total) by mouth 2 (two) times daily with a meal. 10/29/16   Demetrios Loll, MD  famotidine (PEPCID) 20 MG tablet Take 1 tablet (20 mg total) by mouth 2 (two) times daily. 02/04/18   Earleen Newport, MD  oxyCODONE-acetaminophen (PERCOCET) 5-325 MG tablet Take 1 tablet by mouth every 8 (eight) hours as needed. 02/04/18   Earleen Newport, MD  sacubitril-valsartan (ENTRESTO) 24-26 MG Take 1 tablet by mouth 2 (two) times daily. 12/05/16   Minna Merritts, MD  sertraline (ZOLOFT) 100 MG tablet TK 1 T PO QD 11/12/16   [provider]  spironolactone (ALDACTONE) 25 MG tablet Take 25 mg by mouth daily.    [provider]  torsemide (DEMADEX) 20 MG tablet Take 2 tablets (40 mg total) by mouth 2 (two) times daily. 11/07/16   Minna Merritts, MD  traZODone (DESYREL) 50 MG tablet Take 1 tablet (50 mg total) by mouth at bedtime. 10/29/16   Demetrios Loll, MD    Physical Exam: Vitals:   07/12/18 1830 07/12/18 2103 07/12/18 2130 07/12/18 2254  BP: (!) 115/95 104/75 (!) 115/91 (!) 119/97  Pulse: (!) 106 (!) 107 (!) 106 (!) 107  Resp: 17 18 19 20   Temp:    98.3 F (36.8 C)  TempSrc:    Oral  SpO2: 96% 92% 98% 100%  Weight:    127.3 kg  Height:    6' (1.829 m)    Constitutional: NAD, calm, comfortable Eyes: PERRL, lids and conjunctivae normal ENMT: Mucous membranes are moist. Posterior pharynx clear of any exudate or lesions.Normal dentition.  Neck: normal, supple, no masses, no thyromegaly Respiratory: clear to auscultation bilaterally, no wheezing, no crackles. Normal respiratory effort. No accessory muscle use.  Cardiovascular: Regular rate and rhythm, no murmurs / rubs / gallops. No extremity edema. 2+ pedal pulses. No carotid bruits.  Abdomen: no tenderness, no masses palpated. No hepatosplenomegaly. Bowel sounds positive.  Musculoskeletal: no clubbing / cyanosis. No joint deformity upper and lower extremities. Good ROM, no contractures. Normal  muscle tone.  Skin: no rashes, lesions, ulcers. No induration Neurologic: CN 2-12 grossly intact. Sensation intact, DTR normal. Strength 5/5 in all 4.  Psychiatric: Normal judgment and insight. Alert and oriented x 3. Normal mood.    Labs on Admission: I have personally reviewed following labs and imaging studies  CBC: Recent Labs  Lab 07/12/18 1601  WBC 11.7*  NEUTROABS 9.3*  HGB 13.9  HCT 43.0  MCV 94.1  PLT 892   Basic Metabolic Panel: Recent Labs  Lab 07/12/18 1601  NA 136  K 3.5  CL 104  CO2 22  GLUCOSE 164*  BUN 14  CREATININE 1.26*  CALCIUM 9.2   GFR: Estimated Creatinine Clearance: 113.9 mL/min (A) (by C-G formula based on SCr of 1.26 mg/dL (H)). Liver Function Tests: No results for input(s): AST, ALT, ALKPHOS, BILITOT, PROT, ALBUMIN in the last 168 hours. No results for input(s): LIPASE, AMYLASE in the last 168 hours. No results for input(s): AMMONIA in the last 168 hours. Coagulation Profile: No results for input(s): INR, PROTIME in the last 168 hours. Cardiac Enzymes: Recent Labs  Lab 07/12/18 1601  TROPONINI 0.04*   BNP (last 3 results) No results for input(s): PROBNP in the last 8760 hours. HbA1C: No results for input(s): HGBA1C in the last 72 hours. CBG: No results for input(s): GLUCAP in the last 168 hours. Lipid Profile: No results for input(s): CHOL, HDL, LDLCALC, TRIG, CHOLHDL, LDLDIRECT in the last 72 hours. Thyroid Function Tests: No results for input(s): TSH, T4TOTAL, FREET4, T3FREE, THYROIDAB in the last 72 hours. Anemia Panel: No results for input(s): VITAMINB12, FOLATE, FERRITIN, TIBC, IRON, RETICCTPCT in the last 72 hours. Urine analysis:    Component Value Date/Time   COLORURINE YELLOW (A) 10/14/2017 1923   APPEARANCEUR CLEAR (A) 10/14/2017 1923   LABSPEC 1.029 10/14/2017 1923   PHURINE 5.0 10/14/2017 1923   GLUCOSEU NEGATIVE 10/14/2017 1923   HGBUR NEGATIVE 10/14/2017 1923   BILIRUBINUR NEGATIVE 10/14/2017 1923   KETONESUR  NEGATIVE 10/14/2017 1923   PROTEINUR NEGATIVE 10/14/2017 1923   NITRITE NEGATIVE 10/14/2017 1923   LEUKOCYTESUR NEGATIVE 10/14/2017 1923    Radiological Exams on Admission: Dg Chest 2 View  Result Date: 07/12/2018 CLINICAL DATA:  33 year old male with shortness of breath EXAM: CHEST - 2 VIEW COMPARISON:  06/13/2018, 04/10/2018, CT 04/10/2018 FINDINGS: Cardiomediastinal silhouette unchanged in size and contour. Fullness in the central vasculature with peribronchial thickening. Reticular opacities. No evidence of pleural effusion or pneumothorax. No displaced fracture IMPRESSION: Ill-defined reticular opacities bilaterally, may represent early pulmonary edema, or potentially infection. Correlation with lab values may be useful. Cardiomegaly Electronically Signed   By: Corrie Mckusick D.O.   On: 07/12/2018 16:05    EKG: Independently reviewed.  Assessment/Plan Principal Problem:   Acute on chronic systolic CHF (congestive heart failure) ([place])    1. Acute on chronic systolic CHF - 1. CHF pathway 2. Lasix 80mg  IV BID 3. Continue home aldactone 4. Continue Entresto 5. Continue Coreg 6. 2d echo ordered for update, looks like last was in 2018.  If EF still reduced, likely warrants AICD evaluation given young age and severity of EF reduction. 7. UDS pending  DVT prophylaxis: Lovenox Code Status: Full Family Communication: No family in room Disposition Plan: Home after admit Consults called: None Admission status: Place in 25    Danyka Merlin, [place] Hospitalists  How to contact the [facility] Attending or Consulting provider [place] or covering provider during after hours [place], for this patient?  1. Check the care team in [facility] and look for a) attending/consulting [facility] provider listed and b) the [facility] team listed 2. Log into [email or web address]  Amion Physician Scheduling and messaging for groups and whole hospitals  On call and physician scheduling software for group practices, residents,  hospitalists and other medical providers for call, clinic, rotation and shift schedules. On[employer] is a hospital-wide system for scheduling doctors and paging doctors on call. EasyPlot is for scientific plotting and data analysis.  [email or web address]  and use [place]'s universal password to access. If you do not have the  password, please contact the hospital operator.  3. Locate the [facility] provider you are looking for under [facility] and page to a number that you can be directly reached. 4. If you still have difficulty reaching the provider, please page the [facility] (Director on Call) for the Hospitalists listed on amion for assistance.  07/13/2018, 12:24 AM

## 2018-07-13 NOTE — Discharge Instructions (Signed)

## 2018-07-14 DIAGNOSIS — E669 Obesity, unspecified: Secondary | ICD-10-CM

## 2018-07-14 DIAGNOSIS — Z9989 Dependence on other enabling machines and devices: Secondary | ICD-10-CM

## 2018-07-14 DIAGNOSIS — G4733 Obstructive sleep apnea (adult) (pediatric): Secondary | ICD-10-CM

## 2018-07-14 DIAGNOSIS — N182 Chronic kidney disease, stage 2 (mild): Secondary | ICD-10-CM

## 2018-07-14 DIAGNOSIS — F1920 Other psychoactive substance dependence, uncomplicated: Secondary | ICD-10-CM

## 2018-07-14 LAB — BASIC METABOLIC PANEL
Anion gap: 12 (ref 5–15)
BUN: 15 mg/dL (ref 6–20)
CO2: 21 mmol/L — ABNORMAL LOW (ref 22–32)
Calcium: 9.3 mg/dL (ref 8.9–10.3)
Chloride: 107 mmol/L (ref 98–111)
Creatinine, Ser: 1.25 mg/dL — ABNORMAL HIGH (ref 0.61–1.24)
GFR calc Af Amer: >60 mL/min (ref >60)
GFR calc non Af Amer: >60 mL/min (ref >60)
Glucose, Bld: 113 mg/dL — ABNORMAL HIGH (ref 70–99)
Potassium: 3.9 mmol/L (ref 3.5–5.1)
Sodium: 140 mmol/L (ref 135–145)

## 2018-07-14 LAB — MAGNESIUM: Magnesium: 2.3 mg/dL (ref 1.7–2.4)

## 2018-07-14 MED ORDER — METOLAZONE 2.5 MG PO TABS
2.5000 mg | ORAL_TABLET | Freq: Once | ORAL | Status: AC
  Administered 2018-07-14: 2.5 mg via ORAL
  Filled 2018-07-14: qty 1

## 2018-07-14 MED ORDER — POTASSIUM CHLORIDE CRYS ER 20 MEQ PO TBCR
40.0000 meq | EXTENDED_RELEASE_TABLET | Freq: Once | ORAL | Status: AC
  Administered 2018-07-14: 13:00:00 40 meq via ORAL
  Filled 2018-07-14: qty 2

## 2018-07-14 NOTE — Progress Notes (Signed)
Patient ID: Carlos Rotan., male   DOB: [birth year], 33 y.o.   MRN: [id number]     Advanced Heart Failure Rounding Note  PCP-Cardiologist: Ida Rogue, MD   Subjective:    Patient reports good UOP, breathing better, able to lie more flat.   Echo: Severely dilated LV, EF 10-15%, mild-moderate MR.   Objective:   Weight Range: 127 kg Body mass index is 37.96 kg/m.   Vital Signs:   Temp:  [97.3 F (36.3 C)-98.4 F (36.9 C)] 97.3 F (36.3 C) (04/29 0915) Pulse Rate:  [97-102] 97 (04/29 0915) Resp:  [16-21] 21 (04/29 0915) BP: (92-112)/(59-92) 106/63 (04/29 0915) SpO2:  [90 %-100 %] 100 % (04/29 0915) Weight:  [778 kg] 127 kg (04/29 0436) Last BM Date: 07/13/18  Weight change: Filed Weights   07/12/18 2254 07/13/18 0517 07/14/18 0436  Weight: 127.3 kg 127.7 kg 127 kg    Intake/Output:   Intake/Output Summary (Last 24 hours) at 07/14/2018 1014 Last data filed at 07/14/2018 0917 Gross per 24 hour  Intake 720 ml  Output 1950 ml  Net -1230 ml      Physical Exam    General:  Well appearing. No resp difficulty HEENT: Normal Neck: Supple. JVP 14+. Carotids 2+ bilat; no bruits. No lymphadenopathy or thyromegaly appreciated. Cor: PMI lateral. Regular rate & rhythm. No rubs, murmurs. +S3.  Lungs: Clear Abdomen: Soft, nontender, nondistended. No hepatosplenomegaly. No bruits or masses. Good bowel sounds. Extremities: No cyanosis, clubbing, rash. Trace ankle edema.  Neuro: Alert & orientedx3, cranial nerves grossly intact. moves all 4 extremities w/o difficulty. Affect pleasant   Telemetry   NSR 90s (personally reviewed).   Labs    CBC Recent Labs    07/12/18 1601  WBC 11.7*  NEUTROABS 9.3*  HGB 13.9  HCT 43.0  MCV 94.1  PLT 242   Basic Metabolic Panel Recent Labs    07/13/18 0337 07/14/18 0353  NA 138 140  K 3.1* 3.9  CL 104 107  CO2 24 21*  GLUCOSE 135* 113*  BUN 13 15  CREATININE 1.31* 1.25*  CALCIUM 9.0 9.3  MG  --  2.3   Liver  Function Tests No results for input(s): AST, ALT, ALKPHOS, BILITOT, PROT, ALBUMIN in the last 72 hours. No results for input(s): LIPASE, AMYLASE in the last 72 hours. Cardiac Enzymes Recent Labs    07/12/18 1601  TROPONINI 0.04*    BNP: BNP (last 3 results) Recent Labs    04/10/18 0000 06/13/18 1856 07/12/18 1601  BNP 798.0* 853.6* 868.7*    ProBNP (last 3 results) No results for input(s): PROBNP in the last 8760 hours.   D-Dimer No results for input(s): DDIMER in the last 72 hours. Hemoglobin A1C No results for input(s): HGBA1C in the last 72 hours. Fasting Lipid Panel No results for input(s): CHOL, HDL, LDLCALC, TRIG, CHOLHDL, LDLDIRECT in the last 72 hours. Thyroid Function Tests No results for input(s): TSH, T4TOTAL, T3FREE, THYROIDAB in the last 72 hours.  Invalid input(s): FREET3  Other results:   Imaging     No results found.   Medications:     Scheduled Medications: . aspirin EC  81 mg Oral Daily  . atorvastatin  40 mg Oral q1800  . digoxin  0.125 mg Oral Daily  . enoxaparin (LOVENOX) injection  40 mg Subcutaneous Q24H  . famotidine  20 mg Oral BID  . furosemide  80 mg Intravenous BID  . pneumococcal 23 valent vaccine  0.5 mL Intramuscular Tomorrow-1000  .  potassium chloride  20 mEq Oral Daily  . sacubitril-valsartan  1 tablet Oral BID  . sertraline  100 mg Oral Daily  . sodium chloride flush  3 mL Intravenous Q12H  . spironolactone  25 mg Oral Daily  . traZODone  50 mg Oral QHS     Infusions: . sodium chloride       PRN Medications:  sodium chloride, acetaminophen, albuterol, ondansetron (ZOFRAN) IV, oxyCODONE-acetaminophen, sodium chloride flush   Assessment/Plan   1. Acute on chronic systolic CHF: Nonischemic cardiomyopathy.  LHC in 2018 with no significant coronary disease.  Cause of cardiomyopathy thought to be heavy ETOH + cocaine.  He has continued to drink and to use cocaine up until recently.  He is now on IV Lasix, good  response so far and feeling better. Concern for possible low output.  - Continue Lasix 80 mg IV bid today, will give a dose of metolazone 2.5 x 1 today.  If inadequate diuresis or rise in creatinine, will place PICC to follow CVP/co-ox.  - Continue digoxin.  - Continue Entresto 24/26 bid and spironolactone 25 daily.  - Hold beta blocker for now, would try to restart low dose prior to discharge with understanding that he must say off cocaine. - Not candidates for advanced therapies/home inotropes with substance abuse.   2. OSA: CPAP at night.  3. Polysubstance abuse: Counseled to stay off cocaine and ETOH as likely cause of his cardiomyopathy.   Length of Stay: 1  Loralie Champagne, MD  07/14/2018, 10:14 AM  Advanced Heart Failure Team Pager [phone] (M-F; 7a - 4p)  Please contact [facility] for night-coverage after hours (4p -7a ) and weekends on [email or web address]

## 2018-07-14 NOTE — Progress Notes (Signed)
PROGRESS NOTE  Carlos Brewer. HTD:[id number] DOB: 1985/06/13 DOA: 07/12/2018 PCP: Patient, No Pcp Per  33 year old male with hx of cocaine abuse, morbid obesity with BMI 38, systolic NICM CHF with EF 15-20% p/w 3-4 days  Of ncreasing shortness of breath, dry cough , quit smoking 2 days ago taking Bumex in [place]:IOMB 0.04, BNP 868, no fever, WBC 11.7k.  BNP usually runs 700-800.  He was admitted for acute CHF exacerbation.  COVID 19 was negative    HPI/Recap of past 24 hours: Sitting up in chair, no edema, no fever, on room air at rest  report significant sob with slight exertion   Assessment/Plan: Principal Problem:   Acute on chronic systolic CHF (congestive heart failure) (HCC)  Acute on chronic systolic CHF -low EF 55-97% -Nonischemic cardiomyopathy.  LHC in 2018 with no significant coronary disease.   -Cause of cardiomyopathy thought to be heavy ETOH + cocaine.  -feeling better on diuresis, management per cardiology  CKDII Cr at baseline, renal dosing meds  Obesity/OSA on cpap qhs Body mass index is 37.96 kg/m.   Polysubstance abuse: + cocaine, alcohol Smoker Consulted on abstinence   Code Status: full  Family Communication: patient   Disposition Plan: not ready to discharge   Consultants:  cardiology  Procedures:  none  Antibiotics:  none   Objective: BP 100/61 (BP Location: Left Arm)   Pulse 99   Temp 98.2 F (36.8 C) (Oral)   Resp 20   Ht 6' (1.829 m)   Wt 127 kg Comment: A scale  SpO2 95%   BMI 37.96 kg/m   Intake/Output Summary (Last 24 hours) at 07/14/2018 1619 Last data filed at 07/14/2018 1459 Gross per 24 hour  Intake 1300 ml  Output 3050 ml  Net -1750 ml   Filed Weights   07/12/18 2254 07/13/18 0517 07/14/18 0436  Weight: 127.3 kg 127.7 kg 127 kg    Exam: Patient is examined daily including today on 07/14/2018, exams remain the same as of yesterday except that has changed    General:  NAD  Cardiovascular: RRR   Respiratory: diminished, no wheezing, no rhonchi  Abdomen: Soft/ND/NT, positive BS  Musculoskeletal: No Edema  Neuro: alert, oriented   Data Reviewed: Basic Metabolic Panel: Recent Labs  Lab 07/12/18 1601 07/13/18 0337 07/14/18 0353  NA 136 138 140  K 3.5 3.1* 3.9  CL 104 104 107  CO2 22 24 21*  GLUCOSE 164* 135* 113*  BUN 14 13 15   CREATININE 1.26* 1.31* 1.25*  CALCIUM 9.2 9.0 9.3  MG  --   --  2.3   Liver Function Tests: No results for input(s): AST, ALT, ALKPHOS, BILITOT, PROT, ALBUMIN in the last 168 hours. No results for input(s): LIPASE, AMYLASE in the last 168 hours. No results for input(s): AMMONIA in the last 168 hours. CBC: Recent Labs  Lab 07/12/18 1601  WBC 11.7*  NEUTROABS 9.3*  HGB 13.9  HCT 43.0  MCV 94.1  PLT 282   Cardiac Enzymes:   Recent Labs  Lab 07/12/18 1601  TROPONINI 0.04*   BNP (last 3 results) Recent Labs    04/10/18 0000 06/13/18 1856 07/12/18 1601  BNP 798.0* 853.6* 868.7*    ProBNP (last 3 results) No results for input(s): PROBNP in the last 8760 hours.  CBG: No results for input(s): GLUCAP in the last 168 hours.  Recent Results (from the past 240 hour(s))  SARS Coronavirus 2 [facility] order, Performed in [facility] hospital lab)  Status: None   Collection Time: 07/12/18  5:00 PM  Result Value Ref Range Status   SARS Coronavirus 2 NEGATIVE NEGATIVE Final    Comment: (NOTE) If result is NEGATIVE SARS-CoV-2 target nucleic acids are NOT DETECTED. The SARS-CoV-2 RNA is generally detectable in upper and lower  respiratory specimens during the acute phase of infection. The lowest  concentration of SARS-CoV-2 viral copies this assay can detect is 250  copies / mL. A negative result does not preclude SARS-CoV-2 infection  and should not be used as the sole basis for treatment or other  patient management decisions.  A negative result may occur with  improper specimen collection / handling, submission of specimen other   than nasopharyngeal swab, presence of viral mutation(s) within the  areas targeted by this assay, and inadequate number of viral copies  (<250 copies / mL). A negative result must be combined with clinical  observations, patient history, and epidemiological information. If result is POSITIVE SARS-CoV-2 target nucleic acids are DETECTED. The SARS-CoV-2 RNA is generally detectable in upper and lower  respiratory specimens dur ing the acute phase of infection.  Positive  results are indicative of active infection with SARS-CoV-2.  Clinical  correlation with patient history and other diagnostic information is  necessary to determine patient infection status.  Positive results do  not rule out bacterial infection or co-infection with other viruses. If result is PRESUMPTIVE POSTIVE SARS-CoV-2 nucleic acids MAY BE PRESENT.   A presumptive positive result was obtained on the submitted specimen  and confirmed on repeat testing.  While 2019 novel coronavirus  (SARS-CoV-2) nucleic acids may be present in the submitted sample  additional confirmatory testing may be necessary for epidemiological  and / or clinical management purposes  to differentiate between  SARS-CoV-2 and other Sarbecovirus currently known to infect humans.  If clinically indicated additional testing with an alternate test  methodology [phone]) is advised. The SARS-CoV-2 RNA is generally  detectable in upper and lower respiratory sp ecimens during the acute  phase of infection. The expected result is Negative. Fact Sheet for Patients:  StrictlyIdeas.no Fact Sheet for Healthcare Providers: BankingDealers.co.za This test is not yet approved or cleared by the [place] FDA and has been authorized for detection and/or diagnosis of SARS-CoV-2 by FDA under an Emergency Use Authorization (EUA).  This EUA will remain in effect (meaning this test can be used) for the duration of the  COVID-19 declaration under Section 564(b)(1) of the Act, 21 U.S.C. section 360bbb-3(b)(1), unless the authorization is terminated or revoked sooner. Performed at [facility], [place] [address], [place], [place] [address]      Studies: No results found.  Scheduled Meds: . aspirin EC  81 mg Oral Daily  . atorvastatin  40 mg Oral q1800  . digoxin  0.125 mg Oral Daily  . enoxaparin (LOVENOX) injection  40 mg Subcutaneous Q24H  . famotidine  20 mg Oral BID  . furosemide  80 mg Intravenous BID  . metolazone  2.5 mg Oral Once  . pneumococcal 23 valent vaccine  0.5 mL Intramuscular Tomorrow-1000  . potassium chloride  20 mEq Oral Daily  . sacubitril-valsartan  1 tablet Oral BID  . sertraline  100 mg Oral Daily  . sodium chloride flush  3 mL Intravenous Q12H  . spironolactone  25 mg Oral Daily  . traZODone  50 mg Oral QHS    Continuous Infusions: . sodium chloride       Time spent: 31mins I have  personally reviewed and interpreted on  07/14/2018 daily labs, tele strips, imagings as discussed above under date review session and assessment and plans.  I reviewed all nursing notes, pharmacy notes, consultant notes,  vitals, pertinent old records  I have discussed plan of care as described above with RN , patient on 07/14/2018   Florencia Reasons MD, PhD  [facility] Pager [phone]. If 7PM-7AM, please contact night-coverage at [email or web address], password [facility] 07/14/2018, 4:19 PM  LOS: 1 day

## 2018-07-14 NOTE — Progress Notes (Signed)
Placed patient on CPAP At this time as documented.

## 2018-07-15 LAB — BASIC METABOLIC PANEL
Anion gap: 13 (ref 5–15)
BUN: 18 mg/dL (ref 6–20)
CO2: 23 mmol/L (ref 22–32)
Calcium: 9.6 mg/dL (ref 8.9–10.3)
Chloride: 103 mmol/L (ref 98–111)
Creatinine, Ser: 1.3 mg/dL — ABNORMAL HIGH (ref 0.61–1.24)
GFR calc Af Amer: >60 mL/min (ref >60)
GFR calc non Af Amer: >60 mL/min (ref >60)
Glucose, Bld: 101 mg/dL — ABNORMAL HIGH (ref 70–99)
Potassium: 3.6 mmol/L (ref 3.5–5.1)
Sodium: 139 mmol/L (ref 135–145)

## 2018-07-15 MED ORDER — METOLAZONE 5 MG PO TABS
5.0000 mg | ORAL_TABLET | Freq: Once | ORAL | Status: AC
  Administered 2018-07-15: 10:00:00 5 mg via ORAL
  Filled 2018-07-15: qty 1

## 2018-07-15 MED ORDER — POTASSIUM CHLORIDE CRYS ER 20 MEQ PO TBCR
40.0000 meq | EXTENDED_RELEASE_TABLET | Freq: Once | ORAL | Status: AC
  Administered 2018-07-15: 40 meq via ORAL
  Filled 2018-07-15: qty 2

## 2018-07-15 NOTE — Progress Notes (Signed)
PROGRESS NOTE  Carlos Brewer. OTL:[id number] DOB: 24-Jul-1985 DOA: 07/12/2018 PCP: Patient, No Pcp Per  33 year old male with hx of cocaine abuse, morbid obesity with BMI 38, systolic NICM CHF with EF 15-20% p/w 3-4 days  Of ncreasing shortness of breath, dry cough , quit smoking 2 days ago taking Bumex in HR:CBUL 0.04, BNP 868, no fever, WBC 11.7k.  BNP usually runs 700-800.  He was admitted for acute CHF exacerbation.  COVID 19 was negative    HPI/Recap of past 24 hours:  3.6liters of urine last 24hrs, bp low normal, cr hovering from 1.25 to 1.3 He denies pain, continue to reports feeling sob but better  no fever, no cough, on room air at rest   Assessment/Plan: Principal Problem:   Acute on chronic systolic CHF (congestive heart failure) (HCC) Active Problems:   Obesity (BMI 30-39.9)   OSA on CPAP   CKD (chronic kidney disease), stage II   Polysubstance (excluding opioids) dependence (HCC)  Acute on chronic systolic CHF -low EF 84-53% -Nonischemic cardiomyopathy.  LHC in 2018 with no significant coronary disease.   -Cause of cardiomyopathy thought to be heavy ETOH + cocaine.  -feeling better on diuresis, management per cardiology  CKDII Cr at baseline, renal dosing meds  Obesity/OSA on cpap qhs Body mass index is 37.8 kg/m.   Polysubstance abuse: + cocaine, alcohol Smoker Consulted on abstinence   Code Status: full  Family Communication: patient   Disposition Plan: not ready to discharge   Consultants:  cardiology  Procedures:  none  Antibiotics:  none   Objective: BP 93/61 (BP Location: Left Arm)   Pulse 99   Temp 97.7 F (36.5 C) (Oral)   Resp 19   Ht 6' (1.829 m)   Wt 126.4 kg Comment: A scale  SpO2 98%   BMI 37.80 kg/m   Intake/Output Summary (Last 24 hours) at 07/15/2018 0736 Last data filed at 07/15/2018 0631 Gross per 24 hour  Intake 1740 ml  Output 3600 ml  Net -1860 ml   Filed Weights   07/13/18 0517 07/14/18  0436 07/15/18 0057  Weight: 127.7 kg 127 kg 126.4 kg    Exam: Patient is examined daily including today on 07/15/2018, exams remain the same as of yesterday except that has changed    General:  NAD  Cardiovascular: RRR  Respiratory: diminished, no wheezing, no rhonchi  Abdomen: Soft/ND/NT, positive BS  Musculoskeletal: No Edema  Neuro: alert, oriented   Data Reviewed: Basic Metabolic Panel: Recent Labs  Lab 07/12/18 1601 07/13/18 0337 07/14/18 0353 07/15/18 0406  NA 136 138 140 139  K 3.5 3.1* 3.9 3.6  CL 104 104 107 103  CO2 22 24 21* 23  GLUCOSE 164* 135* 113* 101*  BUN 14 13 15 18   CREATININE 1.26* 1.31* 1.25* 1.30*  CALCIUM 9.2 9.0 9.3 9.6  MG  --   --  2.3  --    Liver Function Tests: No results for input(s): AST, ALT, ALKPHOS, BILITOT, PROT, ALBUMIN in the last 168 hours. No results for input(s): LIPASE, AMYLASE in the last 168 hours. No results for input(s): AMMONIA in the last 168 hours. CBC: Recent Labs  Lab 07/12/18 1601  WBC 11.7*  NEUTROABS 9.3*  HGB 13.9  HCT 43.0  MCV 94.1  PLT 282   Cardiac Enzymes:   Recent Labs  Lab 07/12/18 1601  TROPONINI 0.04*   BNP (last 3 results) Recent Labs    04/10/18 0000 06/13/18 1856 07/12/18 1601  BNP  798.0* 853.6* 868.7*    ProBNP (last 3 results) No results for input(s): PROBNP in the last 8760 hours.  CBG: No results for input(s): GLUCAP in the last 168 hours.  Recent Results (from the past 240 hour(s))  SARS Coronavirus 2 [facility] order, Performed in [facility] hospital lab)     Status: None   Collection Time: 07/12/18  5:00 PM  Result Value Ref Range Status   SARS Coronavirus 2 NEGATIVE NEGATIVE Final    Comment: (NOTE) If result is NEGATIVE SARS-CoV-2 target nucleic acids are NOT DETECTED. The SARS-CoV-2 RNA is generally detectable in upper and lower  respiratory specimens during the acute phase of infection. The lowest  concentration of SARS-CoV-2 viral copies this assay can  detect is 250  copies / mL. A negative result does not preclude SARS-CoV-2 infection  and should not be used as the sole basis for treatment or other  patient management decisions.  A negative result may occur with  improper specimen collection / handling, submission of specimen other  than nasopharyngeal swab, presence of viral mutation(s) within the  areas targeted by this assay, and inadequate number of viral copies  (<250 copies / mL). A negative result must be combined with clinical  observations, patient history, and epidemiological information. If result is POSITIVE SARS-CoV-2 target nucleic acids are DETECTED. The SARS-CoV-2 RNA is generally detectable in upper and lower  respiratory specimens dur ing the acute phase of infection.  Positive  results are indicative of active infection with SARS-CoV-2.  Clinical  correlation with patient history and other diagnostic information is  necessary to determine patient infection status.  Positive results do  not rule out bacterial infection or co-infection with other viruses. If result is PRESUMPTIVE POSTIVE SARS-CoV-2 nucleic acids MAY BE PRESENT.   A presumptive positive result was obtained on the submitted specimen  and confirmed on repeat testing.  While 2019 novel coronavirus  (SARS-CoV-2) nucleic acids may be present in the submitted sample  additional confirmatory testing may be necessary for epidemiological  and / or clinical management purposes  to differentiate between  SARS-CoV-2 and other Sarbecovirus currently known to infect humans.  If clinically indicated additional testing with an alternate test  methodology [phone]) is advised. The SARS-CoV-2 RNA is generally  detectable in upper and lower respiratory sp ecimens during the acute  phase of infection. The expected result is Negative. Fact Sheet for Patients:  StrictlyIdeas.no Fact Sheet for Healthcare Providers:  BankingDealers.co.za This test is not yet approved or cleared by the [place] FDA and has been authorized for detection and/or diagnosis of SARS-CoV-2 by FDA under an Emergency Use Authorization (EUA).  This EUA will remain in effect (meaning this test can be used) for the duration of the COVID-19 declaration under Section 564(b)(1) of the Act, 21 U.S.C. section 360bbb-3(b)(1), unless the authorization is terminated or revoked sooner. Performed at [facility], [place] [address], [place], [place] [address]      Studies: No results found.  Scheduled Meds: . aspirin EC  81 mg Oral Daily  . atorvastatin  40 mg Oral q1800  . digoxin  0.125 mg Oral Daily  . enoxaparin (LOVENOX) injection  40 mg Subcutaneous Q24H  . famotidine  20 mg Oral BID  . furosemide  80 mg Intravenous BID  . pneumococcal 23 valent vaccine  0.5 mL Intramuscular Tomorrow-1000  . potassium chloride  20 mEq Oral Daily  . sacubitril-valsartan  1 tablet Oral BID  . sertraline  100  mg Oral Daily  . sodium chloride flush  3 mL Intravenous Q12H  . spironolactone  25 mg Oral Daily  . traZODone  50 mg Oral QHS    Continuous Infusions: . sodium chloride       Time spent: 32mins I have personally reviewed and interpreted on  07/15/2018 daily labs, tele strips, imagings as discussed above under date review session and assessment and plans.  I reviewed all nursing notes, pharmacy notes, consultant notes,  vitals, pertinent old records  I have discussed plan of care as described above with RN , patient on 07/15/2018   Florencia Reasons MD, PhD  [facility] Pager [phone]. If 7PM-7AM, please contact night-coverage at [email or web address], password [facility] 07/15/2018, 7:36 AM  LOS: 2 days

## 2018-07-15 NOTE — Progress Notes (Signed)
Patient ID: Carlos Brewer., male   DOB: [birth year], 33 y.o.   MRN: [id number]     Advanced Heart Failure Rounding Note  PCP-Cardiologist: Ida Rogue, MD   Subjective:    Good diuresis again yesterday, weight starting to come down.  Breathing better. Creatinine stable at 1.3.   Echo: Severely dilated LV, EF 10-15%, mild-moderate MR.   Objective:   Weight Range: 126.4 kg Body mass index is 37.8 kg/m.   Vital Signs:   Temp:  [97.3 F (36.3 C)-98.2 F (36.8 C)] 97.7 F (36.5 C) (04/30 0428) Pulse Rate:  [95-103] 99 (04/30 0428) Resp:  [17-24] 19 (04/30 0541) BP: (90-112)/(60-74) 93/61 (04/30 0428) SpO2:  [95 %-100 %] 98 % (04/30 0428) Weight:  [126.4 kg] 126.4 kg (04/30 0057) Last BM Date: 07/13/18  Weight change: Filed Weights   07/13/18 0517 07/14/18 0436 07/15/18 0057  Weight: 127.7 kg 127 kg 126.4 kg    Intake/Output:   Intake/Output Summary (Last 24 hours) at 07/15/2018 0758 Last data filed at 07/15/2018 0742 Gross per 24 hour  Intake 1380 ml  Output 3575 ml  Net -2195 ml      Physical Exam    General: NAD Neck: JVP 14+, no thyromegaly or thyroid nodule.  Lungs: Clear to auscultation bilaterally with normal respiratory effort. CV: Lateral PMI.  Heart regular S1/S2, no S3/S4, no murmur.  Trace ankle edema.  Abdomen: Soft, nontender, no hepatosplenomegaly, no distention.  Skin: Intact without lesions or rashes.  Neurologic: Alert and oriented x 3.  Psych: Normal affect. Extremities: No clubbing or cyanosis.  HEENT: Normal.    Telemetry   NSR 90s (personally reviewed).   Labs    CBC Recent Labs    07/12/18 1601  WBC 11.7*  NEUTROABS 9.3*  HGB 13.9  HCT 43.0  MCV 94.1  PLT 751   Basic Metabolic Panel Recent Labs    07/14/18 0353 07/15/18 0406  NA 140 139  K 3.9 3.6  CL 107 103  CO2 21* 23  GLUCOSE 113* 101*  BUN 15 18  CREATININE 1.25* 1.30*  CALCIUM 9.3 9.6  MG 2.3  --    Liver Function Tests No results for  input(s): AST, ALT, ALKPHOS, BILITOT, PROT, ALBUMIN in the last 72 hours. No results for input(s): LIPASE, AMYLASE in the last 72 hours. Cardiac Enzymes Recent Labs    07/12/18 1601  TROPONINI 0.04*    BNP: BNP (last 3 results) Recent Labs    04/10/18 0000 06/13/18 1856 07/12/18 1601  BNP 798.0* 853.6* 868.7*    ProBNP (last 3 results) No results for input(s): PROBNP in the last 8760 hours.   D-Dimer No results for input(s): DDIMER in the last 72 hours. Hemoglobin A1C No results for input(s): HGBA1C in the last 72 hours. Fasting Lipid Panel No results for input(s): CHOL, HDL, LDLCALC, TRIG, CHOLHDL, LDLDIRECT in the last 72 hours. Thyroid Function Tests No results for input(s): TSH, T4TOTAL, T3FREE, THYROIDAB in the last 72 hours.  Invalid input(s): FREET3  Other results:   Imaging    No results found.   Medications:     Scheduled Medications: . aspirin EC  81 mg Oral Daily  . atorvastatin  40 mg Oral q1800  . digoxin  0.125 mg Oral Daily  . enoxaparin (LOVENOX) injection  40 mg Subcutaneous Q24H  . famotidine  20 mg Oral BID  . furosemide  80 mg Intravenous BID  . metolazone  5 mg Oral Once  . pneumococcal 23 valent  vaccine  0.5 mL Intramuscular Tomorrow-1000  . potassium chloride  20 mEq Oral Daily  . potassium chloride  40 mEq Oral Once  . sacubitril-valsartan  1 tablet Oral BID  . sertraline  100 mg Oral Daily  . sodium chloride flush  3 mL Intravenous Q12H  . spironolactone  25 mg Oral Daily  . traZODone  50 mg Oral QHS    Infusions: . sodium chloride      PRN Medications: sodium chloride, acetaminophen, albuterol, ondansetron (ZOFRAN) IV, oxyCODONE-acetaminophen, sodium chloride flush   Assessment/Plan   1. Acute on chronic systolic CHF: Nonischemic cardiomyopathy.  LHC in 2018 with no significant coronary disease.  Cause of cardiomyopathy thought to be heavy ETOH + cocaine.  He has continued to drink and to use cocaine up until  recently.  He is now on IV Lasix + metolazone, good response so far and feeling better. Concern for possible low output.  - Continue Lasix 80 mg IV bid today, will give a dose of metolazone 5 mg x 1 today.  If inadequate diuresis or rise in creatinine before he is fully diuresed, will place PICC to follow CVP/co-ox.  - Continue digoxin.  - Continue Entresto 24/26 bid and spironolactone 25 daily.  No BP room to titrate meds.  - Hold beta blocker for now, would try to restart low dose prior to discharge with understanding that he must say off cocaine. - Not candidates for advanced therapies/home inotropes with substance abuse.  Discussed need to avoid.  2. OSA: CPAP at night.  3. Polysubstance abuse: Counseled to stay off cocaine and ETOH as likely cause of his cardiomyopathy.   Length of Stay: 2  Loralie Champagne, MD  07/15/2018, 7:58 AM  Advanced Heart Failure Team Pager [phone] (M-F; 7a - 4p)  Please contact [facility] for night-coverage after hours (4p -7a ) and weekends on [email or web address]

## 2018-07-16 LAB — BASIC METABOLIC PANEL
Anion gap: 15 (ref 5–15)
BUN: 26 mg/dL — ABNORMAL HIGH (ref 6–20)
CO2: 21 mmol/L — ABNORMAL LOW (ref 22–32)
Calcium: 9.8 mg/dL (ref 8.9–10.3)
Chloride: 100 mmol/L (ref 98–111)
Creatinine, Ser: 1.54 mg/dL — ABNORMAL HIGH (ref 0.61–1.24)
GFR calc Af Amer: >60 mL/min (ref >60)
GFR calc non Af Amer: 58 mL/min — ABNORMAL LOW (ref >60)
Glucose, Bld: 114 mg/dL — ABNORMAL HIGH (ref 70–99)
Potassium: 3.2 mmol/L — ABNORMAL LOW (ref 3.5–5.1)
Sodium: 136 mmol/L (ref 135–145)

## 2018-07-16 MED ORDER — TORSEMIDE 20 MG PO TABS
60.0000 mg | ORAL_TABLET | Freq: Two times a day (BID) | ORAL | Status: DC
  Administered 2018-07-17: 60 mg via ORAL
  Filled 2018-07-16: qty 3

## 2018-07-16 MED ORDER — ALBUTEROL SULFATE HFA 108 (90 BASE) MCG/ACT IN AERS: 1.0000 | INHALATION_SPRAY | Freq: Four times a day (QID) | RESPIRATORY_TRACT | Status: DC | PRN

## 2018-07-16 MED ORDER — POTASSIUM CHLORIDE CRYS ER 20 MEQ PO TBCR
40.0000 meq | EXTENDED_RELEASE_TABLET | Freq: Once | ORAL | Status: AC
  Administered 2018-07-16: 40 meq via ORAL
  Filled 2018-07-16: qty 2

## 2018-07-16 NOTE — Progress Notes (Signed)
RT Note: Pt asleep on RT arrival. Pt does not want CPAP at this time I told him to call if he needs assistance later.

## 2018-07-16 NOTE — Progress Notes (Signed)
Patient ID: Carlos Brewer., male   DOB: [birth year], 33 y.o.   MRN: [id number]     Advanced Heart Failure Rounding Note  PCP-Cardiologist: Ida Rogue, MD   Subjective:    Creatinine higher today, 1.54.  SBP 90s.  He feels much better, was able to walk fast down the hall. Good UOP, not really reflective in weight loss.    Echo: Severely dilated LV, EF 10-15%, mild-moderate MR.   Objective:   Weight Range: 126.1 kg Body mass index is 37.7 kg/m.   Vital Signs:   Temp:  [97.3 F (36.3 C)-97.7 F (36.5 C)] 97.7 F (36.5 C) (05/01 0940) Pulse Rate:  [67-103] 91 (05/01 0940) Resp:  [16-20] 20 (05/01 0700) BP: (91-103)/(50-69) 93/69 (05/01 0940) SpO2:  [92 %-95 %] 95 % (05/01 0940) FiO2 (%):  [95 %] 95 % (05/01 0031) Weight:  [126.1 kg] 126.1 kg (05/01 0134) Last BM Date: 07/13/18  Weight change: Filed Weights   07/14/18 0436 07/15/18 0057 07/16/18 0134  Weight: 127 kg 126.4 kg 126.1 kg    Intake/Output:   Intake/Output Summary (Last 24 hours) at 07/16/2018 1206 Last data filed at 07/16/2018 1000 Gross per 24 hour  Intake 580 ml  Output 1750 ml  Net -1170 ml      Physical Exam    General: NAD Neck: No JVD, no thyromegaly or thyroid nodule.  Lungs: Clear to auscultation bilaterally with normal respiratory effort. CV: Nondisplaced PMI.  Heart regular S1/S2, no S3/S4, no murmur.  No peripheral edema.   Abdomen: Soft, nontender, no hepatosplenomegaly, no distention.  Skin: Intact without lesions or rashes.  Neurologic: Alert and oriented x 3.  Psych: Normal affect. Extremities: No clubbing or cyanosis.  HEENT: Normal.   Telemetry   NSR 90s (personally reviewed).   Labs    CBC No results for input(s): WBC, NEUTROABS, HGB, HCT, MCV, PLT in the last 72 hours. Basic Metabolic Panel Recent Labs    07/14/18 0353 07/15/18 0406 07/16/18 0406  NA 140 139 136  K 3.9 3.6 3.2*  CL 107 103 100  CO2 21* 23 21*  GLUCOSE 113* 101* 114*  BUN 15 18 26*   CREATININE 1.25* 1.30* 1.54*  CALCIUM 9.3 9.6 9.8  MG 2.3  --   --    Liver Function Tests No results for input(s): AST, ALT, ALKPHOS, BILITOT, PROT, ALBUMIN in the last 72 hours. No results for input(s): LIPASE, AMYLASE in the last 72 hours. Cardiac Enzymes No results for input(s): CKTOTAL, CKMB, CKMBINDEX, TROPONINI in the last 72 hours.  BNP: BNP (last 3 results) Recent Labs    04/10/18 0000 06/13/18 1856 07/12/18 1601  BNP 798.0* 853.6* 868.7*    ProBNP (last 3 results) No results for input(s): PROBNP in the last 8760 hours.   D-Dimer No results for input(s): DDIMER in the last 72 hours. Hemoglobin A1C No results for input(s): HGBA1C in the last 72 hours. Fasting Lipid Panel No results for input(s): CHOL, HDL, LDLCALC, TRIG, CHOLHDL, LDLDIRECT in the last 72 hours. Thyroid Function Tests No results for input(s): TSH, T4TOTAL, T3FREE, THYROIDAB in the last 72 hours.  Invalid input(s): FREET3  Other results:   Imaging    No results found.   Medications:     Scheduled Medications: . aspirin EC  81 mg Oral Daily  . atorvastatin  40 mg Oral q1800  . digoxin  0.125 mg Oral Daily  . enoxaparin (LOVENOX) injection  40 mg Subcutaneous Q24H  . famotidine  20  mg Oral BID  . furosemide  80 mg Intravenous BID  . pneumococcal 23 valent vaccine  0.5 mL Intramuscular Tomorrow-1000  . potassium chloride  20 mEq Oral Daily  . potassium chloride  40 mEq Oral Once  . sacubitril-valsartan  1 tablet Oral BID  . sertraline  100 mg Oral Daily  . sodium chloride flush  3 mL Intravenous Q12H  . spironolactone  25 mg Oral Daily  . traZODone  50 mg Oral QHS    Infusions: . sodium chloride      PRN Medications: sodium chloride, acetaminophen, albuterol, ondansetron (ZOFRAN) IV, oxyCODONE-acetaminophen, sodium chloride flush   Assessment/Plan   1. Acute on chronic systolic CHF: Nonischemic cardiomyopathy.  LHC in 2018 with no significant coronary disease.  Cause of  cardiomyopathy thought to be heavy ETOH + cocaine.  He has continued to drink and to use cocaine up until recently.  He has had IV Lasix + metolazone, good UOP but weight not down as much as I would have expected.  He feels much better and on exam today, does not appear volume overloaded.  Creatinine up to 1.56. - He got 1 dose of IV Lasix this morning, will transition to torsemide 60 mg bid for home dosing tomorrow.  - Continue digoxin.  - Continue Entresto 24/26 bid and spironolactone 25 daily.  No BP room to titrate meds.  - Beta blocker held initially, can restart back low dose Coreg tomorrow if BP stable.  - Not candidate for advanced therapies/home inotropes with substance abuse.  Discussed need to avoid.  2. OSA: CPAP at night.  3. Polysubstance abuse: Counseled to stay off cocaine and ETOH as likely cause of his cardiomyopathy.   Length of Stay: 3  Loralie Champagne, MD  07/16/2018, 12:06 PM  Advanced Heart Failure Team Pager [phone] (M-F; 7a - 4p)  Please contact [facility] for night-coverage after hours (4p -7a ) and weekends on [email or web address]

## 2018-07-16 NOTE — Progress Notes (Addendum)
PROGRESS NOTE  Carlos Brewer. WPY:[id number] DOB: 25-Dec-1985 DOA: 07/12/2018 PCP: Patient, No Pcp Per  33 year old male with hx of cocaine abuse, morbid obesity with BMI 38, systolic NICM CHF with EF 15-20% p/w 3-4 days  Of ncreasing shortness of breath, dry cough , quit smoking 2 days ago taking Bumex in KN:LZJQ 0.04, BNP 868, no fever, WBC 11.7k.  BNP usually runs 700-800.  He was admitted for acute CHF exacerbation.  COVID 19 was negative    HPI/Recap of past 24 hours:  2 liters of urine last 24hrs, bp low normal, cr appears to trend up, today is 1.54 He denies pain, continue to feel better, he walked in the hallway today,   no fever, no cough, on room air at rest   Assessment/Plan: Principal Problem:   Acute on chronic systolic CHF (congestive heart failure) (HCC) Active Problems:   Obesity (BMI 30-39.9)   OSA on CPAP   CKD (chronic kidney disease), stage II   Polysubstance (excluding opioids) dependence (HCC)  Acute on chronic systolic CHF -low EF 73-41% -Nonischemic cardiomyopathy.  LHC in 2018 with no significant coronary disease.   -Cause of cardiomyopathy thought to be heavy ETOH + cocaine.  -feeling better on diuresis, management per cardiology  Hypokalemia: replace k  CKDII Cr at baseline, renal dosing meds  Obesity/OSA on cpap qhs Body mass index is 37.7 kg/m.   Polysubstance abuse: + cocaine, alcohol Smoker Consulted on abstinence   Code Status: full  Family Communication: patient   Disposition Plan: likely on 5/2 if cardiology clears him He reports he gets his meds for free from [facility] He requested inhalor prescription prior to discharge  Consultants:  cardiology  Procedures:  none  Antibiotics:  none   Objective: BP (!) 91/50 (BP Location: Right Arm)   Pulse 86   Temp (!) 97.3 F (36.3 C) (Oral)   Resp 20   Ht 6' (1.829 m)   Wt 126.1 kg Comment: Ascale  SpO2 92%   BMI 37.70 kg/m   Intake/Output Summary (Last  24 hours) at 07/16/2018 0805 Last data filed at 07/16/2018 0700 Gross per 24 hour  Intake 580 ml  Output 1650 ml  Net -1070 ml   Filed Weights   07/14/18 0436 07/15/18 0057 07/16/18 0134  Weight: 127 kg 126.4 kg 126.1 kg    Exam: Patient is examined daily including today on 07/16/2018, exams remain the same as of yesterday except that has changed    General:  NAD  Cardiovascular: RRR  Respiratory: improved aeration, mild bibasilar crackles, no wheezing, no rhonchi  Abdomen: Soft/ND/NT, positive BS  Musculoskeletal: No Edema  Neuro: alert, oriented   Data Reviewed: Basic Metabolic Panel: Recent Labs  Lab 07/12/18 1601 07/13/18 0337 07/14/18 0353 07/15/18 0406 07/16/18 0406  NA 136 138 140 139 136  K 3.5 3.1* 3.9 3.6 3.2*  CL 104 104 107 103 100  CO2 22 24 21* 23 21*  GLUCOSE 164* 135* 113* 101* 114*  BUN 14 13 15 18  26*  CREATININE 1.26* 1.31* 1.25* 1.30* 1.54*  CALCIUM 9.2 9.0 9.3 9.6 9.8  MG  --   --  2.3  --   --    Liver Function Tests: No results for input(s): AST, ALT, ALKPHOS, BILITOT, PROT, ALBUMIN in the last 168 hours. No results for input(s): LIPASE, AMYLASE in the last 168 hours. No results for input(s): AMMONIA in the last 168 hours. CBC: Recent Labs  Lab 07/12/18 1601  WBC 11.7*  NEUTROABS 9.3*  HGB 13.9  HCT 43.0  MCV 94.1  PLT 282   Cardiac Enzymes:   Recent Labs  Lab 07/12/18 1601  TROPONINI 0.04*   BNP (last 3 results) Recent Labs    04/10/18 0000 06/13/18 1856 07/12/18 1601  BNP 798.0* 853.6* 868.7*    ProBNP (last 3 results) No results for input(s): PROBNP in the last 8760 hours.  CBG: No results for input(s): GLUCAP in the last 168 hours.  Recent Results (from the past 240 hour(s))  SARS Coronavirus 2 [facility] order, Performed in [facility] hospital lab)     Status: None   Collection Time: 07/12/18  5:00 PM  Result Value Ref Range Status   SARS Coronavirus 2 NEGATIVE NEGATIVE Final    Comment: (NOTE) If result is  NEGATIVE SARS-CoV-2 target nucleic acids are NOT DETECTED. The SARS-CoV-2 RNA is generally detectable in upper and lower  respiratory specimens during the acute phase of infection. The lowest  concentration of SARS-CoV-2 viral copies this assay can detect is 250  copies / mL. A negative result does not preclude SARS-CoV-2 infection  and should not be used as the sole basis for treatment or other  patient management decisions.  A negative result may occur with  improper specimen collection / handling, submission of specimen other  than nasopharyngeal swab, presence of viral mutation(s) within the  areas targeted by this assay, and inadequate number of viral copies  (<250 copies / mL). A negative result must be combined with clinical  observations, patient history, and epidemiological information. If result is POSITIVE SARS-CoV-2 target nucleic acids are DETECTED. The SARS-CoV-2 RNA is generally detectable in upper and lower  respiratory specimens dur ing the acute phase of infection.  Positive  results are indicative of active infection with SARS-CoV-2.  Clinical  correlation with patient history and other diagnostic information is  necessary to determine patient infection status.  Positive results do  not rule out bacterial infection or co-infection with other viruses. If result is PRESUMPTIVE POSTIVE SARS-CoV-2 nucleic acids MAY BE PRESENT.   A presumptive positive result was obtained on the submitted specimen  and confirmed on repeat testing.  While 2019 novel coronavirus  (SARS-CoV-2) nucleic acids may be present in the submitted sample  additional confirmatory testing may be necessary for epidemiological  and / or clinical management purposes  to differentiate between  SARS-CoV-2 and other Sarbecovirus currently known to infect humans.  If clinically indicated additional testing with an alternate test  methodology [phone]) is advised. The SARS-CoV-2 RNA is generally  detectable  in upper and lower respiratory sp ecimens during the acute  phase of infection. The expected result is Negative. Fact Sheet for Patients:  StrictlyIdeas.no Fact Sheet for Healthcare Providers: BankingDealers.co.za This test is not yet approved or cleared by the [place] FDA and has been authorized for detection and/or diagnosis of SARS-CoV-2 by FDA under an Emergency Use Authorization (EUA).  This EUA will remain in effect (meaning this test can be used) for the duration of the COVID-19 declaration under Section 564(b)(1) of the Act, 21 U.S.C. section 360bbb-3(b)(1), unless the authorization is terminated or revoked sooner. Performed at [facility], [place] [address] [place], [place] [address]      Studies: No results found.  Scheduled Meds: . aspirin EC  81 mg Oral Daily  . atorvastatin  40 mg Oral q1800  . digoxin  0.125 mg Oral Daily  . enoxaparin (LOVENOX) injection  40 mg Subcutaneous Q24H  .  famotidine  20 mg Oral BID  . furosemide  80 mg Intravenous BID  . pneumococcal 23 valent vaccine  0.5 mL Intramuscular Tomorrow-1000  . potassium chloride  20 mEq Oral Daily  . potassium chloride  40 mEq Oral Once  . sacubitril-valsartan  1 tablet Oral BID  . sertraline  100 mg Oral Daily  . sodium chloride flush  3 mL Intravenous Q12H  . spironolactone  25 mg Oral Daily  . traZODone  50 mg Oral QHS    Continuous Infusions: . sodium chloride       Time spent: 47mins I have personally reviewed and interpreted on  07/16/2018 daily labs, tele strips, imagings as discussed above under date review session and assessment and plans.  I reviewed all nursing notes, pharmacy notes, consultant notes,  vitals, pertinent old records  I have discussed plan of care as described above with RN , patient on 07/16/2018   Florencia Reasons MD, PhD  [facility] Pager [phone]. If 7PM-7AM, please contact night-coverage at [email or web address],  password [facility] 07/16/2018, 8:05 AM  LOS: 3 days

## 2018-07-17 LAB — BASIC METABOLIC PANEL WITH GFR
Anion gap: 11 (ref 5–15)
BUN: 27 mg/dL — ABNORMAL HIGH (ref 6–20)
CO2: 23 mmol/L (ref 22–32)
Calcium: 10.1 mg/dL (ref 8.9–10.3)
Chloride: 101 mmol/L (ref 98–111)
Creatinine, Ser: 1.45 mg/dL — ABNORMAL HIGH (ref 0.61–1.24)
GFR calc Af Amer: >60 mL/min
GFR calc non Af Amer: >60 mL/min
Glucose, Bld: 151 mg/dL — ABNORMAL HIGH (ref 70–99)
Potassium: 3.7 mmol/L (ref 3.5–5.1)
Sodium: 135 mmol/L (ref 135–145)

## 2018-07-17 LAB — MAGNESIUM: Magnesium: 2.2 mg/dL (ref 1.7–2.4)

## 2018-07-17 MED ORDER — ALBUTEROL SULFATE HFA 108 (90 BASE) MCG/ACT IN AERS: 1.0000 | INHALATION_SPRAY | Freq: Four times a day (QID) | RESPIRATORY_TRACT | 0 refills | Status: AC | PRN

## 2018-07-17 MED ORDER — POTASSIUM CHLORIDE CRYS ER 20 MEQ PO TBCR
40.0000 meq | EXTENDED_RELEASE_TABLET | Freq: Once | ORAL | Status: AC
  Administered 2018-07-17: 40 meq via ORAL
  Filled 2018-07-17: qty 2

## 2018-07-17 MED ORDER — POTASSIUM CHLORIDE CRYS ER 20 MEQ PO TBCR: 20.0000 meq | EXTENDED_RELEASE_TABLET | Freq: Every day | ORAL | 0 refills | Status: DC

## 2018-07-17 MED ORDER — DIGOXIN 125 MCG PO TABS: 0.1250 mg | ORAL_TABLET | Freq: Every day | ORAL | 0 refills | Status: DC

## 2018-07-17 MED ORDER — METOLAZONE 2.5 MG PO TABS: 2.5000 mg | ORAL_TABLET | Freq: Every day | ORAL | 0 refills | Status: AC | PRN

## 2018-07-17 NOTE — Progress Notes (Signed)
SATURATION QUALIFICATIONS: (This note is used to comply with regulatory documentation for home oxygen)  Patient Saturations on Room Air at Rest = 100%  Patient Saturations on Room Air while Ambulating = 98% 

## 2018-07-17 NOTE — Discharge Summary (Signed)
Discharge Summary  Carlos Brewer. [id number] DOB: 12/31/1985  PCP: Patient, No Pcp Per  Admit date: 07/12/2018 Discharge date: 07/17/2018  Time spent: 18mins  Recommendations for Outpatient Follow-up:  1. F/u with PCP at [facility] within a week  for hospital discharge follow up, repeat cbc/bmp at follow up. 2. F/u with cardiology for heart failure mangement  Discharge Diagnoses:  Active Hospital Problems   Diagnosis Date Noted  . Acute on chronic systolic CHF (congestive heart failure) ([place]) 07/12/2018  . Obesity (BMI 30-39.9)   . OSA on CPAP   . CKD (chronic kidney disease), stage II   . Polysubstance (excluding opioids) dependence [facility])     Resolved Hospital Problems  No resolved problems to display.    Discharge Condition: stable  Diet recommendation: heart healthy  Filed Weights   07/15/18 0057 07/16/18 0134 07/17/18 0051  Weight: 126.4 kg 126.1 kg 125.3 kg    History of present illness: (per admitting MD Dr Alcario Drought) Patient coming from: Home  I have personally briefly reviewed patient's old medical records in [place]  Chief Complaint: SOB  HPI: Carlos Brewer. is a 33 y.o. male with medical history significant of cocaine abuse, systolic NICM CHF with EF 15-20%.  Patient says last time he used cocaine was last week.  He states over the last 3 to 4 days has had increasing shortness of breath. He denies any leg swelling but states he never gets leg swelling with his CHF exacerbations. He feels a little fatigued. He has a cough which is mostly dry. He denies any chest pain or tightness. No known fevers. No URI symptoms. Lately he has been using a friend's nebulizer machine because he he feels like it helps although it has not been helping over the last couple days. He states previously he was given albuterol MDI but he is out of it. He denies any known history of asthma or COPD although he is a smoker. He quit smoking 2 days  ago he says. He states he has not missed any doses of his Bumex although he has not taken it today.   ED Course: Trop 0.04, BNP 868, no fever, WBC 11.7k.  BNP usually runs 700-800 historically with CHF exacerbations / presentations to the ED.  He has frequent presentations to ED with CHF, though usually responds more quickly to 80mg  IV lasix and can be discharged home (last was x1 month ago on 3/29).  Today despite given 80mg  of lasix and having "some improvement" after what sounds like well over 1L UOP.  EDP felt that he was still SOB and needed obs admit.  COVID-19 neg.  Hospital Course:  Principal Problem:   Acute on chronic systolic CHF (congestive heart failure) (HCC) Active Problems:   Obesity (BMI 30-39.9)   OSA on CPAP   CKD (chronic kidney disease), stage II   Polysubstance (excluding opioids) dependence (HCC)  Acute on chronic systolic CHF -low EF 57-84% -Nonischemic cardiomyopathy. LHC in 2018 with no significant coronary disease.  -Cause of cardiomyopathy thought to be heavy ETOH + cocaine.  -feeling better on diuresis, negative 7liters,  -he is cleared by cardiology to discharge home on the following meds, he is to follow up with cardiology closely   Torsemide 40 bid  Entresto 26/24 bid Carvedilol 3.125 bid  Arlyce Harman 25 daily Digoxin 0.125 daily   Metolazone 2.5 and kcl 79meq take prn only for 5 pound weight gain.   Hypokalemia: replaced k, discharged on KCl 21meq  daily  CKDII Cr at baseline, renal dosing meds  Obesity/OSA on cpap qhs Body mass index is 37.7 kg/m.   Polysubstance abuse: + cocaine, alcohol Smoker Consulted on abstinence   Code Status: full  Family Communication: patient   Disposition Plan: home on  5/2 with cardiology  clearance He reports he gets his meds for free from [place]:  cardiology  Procedures:  none  Antibiotics:  none  Discharge Exam: BP 99/60   Pulse 98   Temp 97.9 F (36.6 C)  (Oral)   Resp 18   Ht 6' (1.829 m)   Wt 125.3 kg   SpO2 100%   BMI 37.47 kg/m    General:  NAD  Cardiovascular: RRR  Respiratory: improved aeration, mild bibasilar crackles, no wheezing, no rhonchi  Abdomen: Soft/ND/NT, positive BS  Musculoskeletal: No Edema  Neuro: alert, oriented    Discharge Instructions You were cared for by a hospitalist during your hospital stay. If you have any questions about your discharge medications or the care you received while you were in the hospital after you are discharged, you can call the unit and asked to speak with the hospitalist on call if the hospitalist that took care of you is not available. Once you are discharged, your primary care physician will handle any further medical issues. Please note that NO REFILLS for any discharge medications will be authorized once you are discharged, as it is imperative that you return to your primary care physician (or establish a relationship with a primary care physician if you do not have one) for your aftercare needs so that they can reassess your need for medications and monitor your lab values.  Discharge Instructions    Diet - low sodium heart healthy   Complete by:  As directed    Discharge instructions   Complete by:  As directed    Please weigh yourself daily, please call your cardiologist if your weight goes up by 3lbs in three days,   Increase activity slowly   Complete by:  As directed      Allergies as of 07/17/2018   No Known Allergies     Medication List    TAKE these medications   acetaminophen 500 MG tablet Commonly known as:  TYLENOL Take 2 tablets (1,000 mg total) by mouth every 6 (six) hours as needed. What changed:  reasons to take this   albuterol 108 (90 Base) MCG/ACT inhaler Commonly known as:  VENTOLIN HFA Inhale 1-2 puffs into the lungs every 6 (six) hours as needed for wheezing or shortness of breath.   aspirin 81 MG EC tablet Take 1 tablet (81 mg total) by mouth  daily.   atorvastatin 40 MG tablet Commonly known as:  LIPITOR Take 1 tablet (40 mg total) by mouth daily at 6 PM.   carvedilol 3.125 MG tablet Commonly known as:  COREG Take 1 tablet (3.125 mg total) by mouth 2 (two) times daily with a meal.   digoxin 0.125 MG tablet Commonly known as:  LANOXIN Take 1 tablet (0.125 mg total) by mouth daily. Start taking on:  Jul 18, 2018   famotidine 20 MG tablet Commonly known as:  Pepcid Take 1 tablet (20 mg total) by mouth 2 (two) times daily.   metolazone 2.5 MG tablet Commonly known as:  ZAROXOLYN Take 1 tablet (2.5 mg total) by mouth daily as needed for up to 30 days. Take metolazone 2.5mg  x1 only for 5pound weight gain, please take potassium  4meq x1 with metolazone.   potassium chloride SA 20 MEQ tablet Commonly known as:  K-DUR Take 1 tablet (20 mEq total) by mouth daily. Can take extra one if taking metolazone. Start taking on:  Jul 18, 2018   sacubitril-valsartan 24-26 MG Commonly known as:  ENTRESTO Take 1 tablet by mouth 2 (two) times daily.   sertraline 100 MG tablet Commonly known as:  ZOLOFT Take 100 mg by mouth daily.   spironolactone 25 MG tablet Commonly known as:  ALDACTONE Take 25 mg by mouth daily.   torsemide 20 MG tablet Commonly known as:  DEMADEX Take 2 tablets (40 mg total) by mouth 2 (two) times daily.   traZODone 50 MG tablet Commonly known as:  DESYREL Take 1 tablet (50 mg total) by mouth at bedtime.      No Known Allergies Follow-up Information    [facility] Follow up.   Specialty:  Cardiology Why:  We will call you for telehealth visit.  Contact information: [address] [id number] [place] [phone]       Bensimhon, Shaune Pascal, MD Follow up.   Specialty:  Cardiology Why:  for heart failure management Contact information: [place] [address] [phone]        f[facility]u wiht pcp  at [facility] Follow up.            The results of significant diagnostics from this hospitalization (including imaging, microbiology, ancillary and laboratory) are listed below for reference.    Significant Diagnostic Studies: Dg Chest 2 View  Result Date: 07/12/2018 CLINICAL DATA:  33 year old male with shortness of breath EXAM: CHEST - 2 VIEW COMPARISON:  06/13/2018, 04/10/2018, CT 04/10/2018 FINDINGS: Cardiomediastinal silhouette unchanged in size and contour. Fullness in the central vasculature with peribronchial thickening. Reticular opacities. No evidence of pleural effusion or pneumothorax. No displaced fracture IMPRESSION: Ill-defined reticular opacities bilaterally, may represent early pulmonary edema, or potentially infection. Correlation with lab values may be useful. Cardiomegaly Electronically Signed   By: Corrie Mckusick D.O.   On: 07/12/2018 16:05    Microbiology: Recent Results (from the past 240 hour(s))  SARS Coronavirus 2 [facility] order, Performed in [facility] hospital lab)     Status: None   Collection Time: 07/12/18  5:00 PM  Result Value Ref Range Status   SARS Coronavirus 2 NEGATIVE NEGATIVE Final    Comment: (NOTE) If result is NEGATIVE SARS-CoV-2 target nucleic acids are NOT DETECTED. The SARS-CoV-2 RNA is generally detectable in upper and lower  respiratory specimens during the acute phase of infection. The lowest  concentration of SARS-CoV-2 viral copies this assay can detect is 250  copies / mL. A negative result does not preclude SARS-CoV-2 infection  and should not be used as the sole basis for treatment or other  patient management decisions.  A negative result may occur with  improper specimen collection / handling, submission of specimen other  than nasopharyngeal swab, presence of viral mutation(s) within the  areas targeted by this assay, and inadequate number of viral copies  (<250 copies / mL). A negative result must be combined with clinical   observations, patient history, and epidemiological information. If result is POSITIVE SARS-CoV-2 target nucleic acids are DETECTED. The SARS-CoV-2 RNA is generally detectable in upper and lower  respiratory specimens dur ing the acute phase of infection.  Positive  results are indicative of active infection with SARS-CoV-2.  Clinical  correlation with  patient history and other diagnostic information is  necessary to determine patient infection status.  Positive results do  not rule out bacterial infection or co-infection with other viruses. If result is PRESUMPTIVE POSTIVE SARS-CoV-2 nucleic acids MAY BE PRESENT.   A presumptive positive result was obtained on the submitted specimen  and confirmed on repeat testing.  While 2019 novel coronavirus  (SARS-CoV-2) nucleic acids may be present in the submitted sample  additional confirmatory testing may be necessary for epidemiological  and / or clinical management purposes  to differentiate between  SARS-CoV-2 and other Sarbecovirus currently known to infect humans.  If clinically indicated additional testing with an alternate test  methodology [id number]) is advised. The SARS-CoV-2 RNA is generally  detectable in upper and lower respiratory sp ecimens during the acute  phase of infection. The expected result is Negative. Fact Sheet for Patients:  StrictlyIdeas.no Fact Sheet for Healthcare Providers: BankingDealers.co.za This test is not yet approved or cleared by the [place] FDA and has been authorized for detection and/or diagnosis of SARS-CoV-2 by FDA under an Emergency Use Authorization (EUA).  This EUA will remain in effect (meaning this test can be used) for the duration of the COVID-19 declaration under Section 564(b)(1) of the Act, 21 U.S.C. section 360bbb-3(b)(1), unless the authorization is terminated or revoked sooner. Performed at [facility], [place] [address],  [place], [place] [address]      Labs: Basic Metabolic Panel: Recent Labs  Lab 07/13/18 [phone] 07/14/18 0353 07/15/18 0406 07/16/18 0406 07/17/18 0244  NA 138 140 139 136 135  K 3.1* 3.9 3.6 3.2* 3.7  CL 104 107 103 100 101  CO2 24 21* 23 21* 23  GLUCOSE 135* 113* 101* 114* 151*  BUN 13 15 18  26* 27*  CREATININE 1.31* 1.25* 1.30* 1.54* 1.45*  CALCIUM 9.0 9.3 9.6 9.8 10.1  MG  --  2.3  --   --  2.2   Liver Function Tests: No results for input(s): AST, ALT, ALKPHOS, BILITOT, PROT, ALBUMIN in the last 168 hours. No results for input(s): LIPASE, AMYLASE in the last 168 hours. No results for input(s): AMMONIA in the last 168 hours. CBC: Recent Labs  Lab 07/12/18 1601  WBC 11.7*  NEUTROABS 9.3*  HGB 13.9  HCT 43.0  MCV 94.1  PLT 282   Cardiac Enzymes: Recent Labs  Lab 07/12/18 1601  TROPONINI 0.04*   BNP: BNP (last 3 results) Recent Labs    04/10/18 0000 06/13/18 1856 07/12/18 1601  BNP 798.0* 853.6* 868.7*    ProBNP (last 3 results) No results for input(s): PROBNP in the last 8760 hours.  CBG: No results for input(s): GLUCAP in the last 168 hours.     Signed:  Florencia Reasons MD, PhD  [facility] 07/17/2018, 12:00 PM

## 2018-07-17 NOTE — Progress Notes (Signed)
Patient discharged: Home with family  Via: Wheelchair   Discharge paperwork given: to patient and family  Reviewed with teach back  IV and telemetry disconnected  Belongings given to patient    

## 2018-07-17 NOTE — Progress Notes (Addendum)
Patient ID: Carlos Brewer., male   DOB: Oct 15, 1985, 33 y.o.   MRN: [id number]     Advanced Heart Failure Rounding Note  PCP-Cardiologist: Ida Rogue, MD   Subjective:    Feels good. Denies CP or SOB. No orthopnea or PND. Weight down 2 more pounds. Creatinine improved to 1.4.   Echo: Severely dilated LV, EF 10-15%, mild-moderate MR.   Objective:   Weight Range: 125.3 kg Body mass index is 37.47 kg/m.   Vital Signs:   Temp:  [97.7 F (36.5 C)-97.9 F (36.6 C)] 97.9 F (36.6 C) (05/02 0653) Pulse Rate:  [67-102] 98 (05/02 0859) Resp:  [18] 18 (05/02 0859) BP: (92-105)/(60-83) 99/60 (05/02 0859) SpO2:  [95 %-100 %] 100 % (05/02 0859) Weight:  [125.3 kg] 125.3 kg (05/02 0051) Last BM Date: 07/17/18  Weight change: Filed Weights   07/15/18 0057 07/16/18 0134 07/17/18 0051  Weight: 126.4 kg 126.1 kg 125.3 kg    Intake/Output:   Intake/Output Summary (Last 24 hours) at 07/17/2018 0922 Last data filed at 07/17/2018 0905 Gross per 24 hour  Intake 840 ml  Output 2150 ml  Net -1310 ml      Physical Exam    General:  Well appearing. No resp difficulty HEENT: normal Neck: supple. no JVD. Carotids 2+ bilat; no bruits. No lymphadenopathy or thryomegaly appreciated. Cor: PMI laterally displaced. Regular rate & rhythm. No rubs, gallops or murmurs. Lungs: clear Abdomen: obese soft, nontender, nondistended. No hepatosplenomegaly. No bruits or masses. Good bowel sounds. Extremities: no cyanosis, clubbing, rash, edema Neuro: alert & orientedx3, cranial nerves grossly intact. moves all 4 extremities w/o difficulty. Affect pleasant   Telemetry   NSR 90s (personally reviewed).   Labs    CBC No results for input(s): WBC, NEUTROABS, HGB, HCT, MCV, PLT in the last 72 hours. Basic Metabolic Panel Recent Labs    07/16/18 0406 07/17/18 0244  NA 136 135  K 3.2* 3.7  CL 100 101  CO2 21* 23  GLUCOSE 114* 151*  BUN 26* 27*  CREATININE 1.54* 1.45*  CALCIUM 9.8  10.1  MG  --  2.2   Liver Function Tests No results for input(s): AST, ALT, ALKPHOS, BILITOT, PROT, ALBUMIN in the last 72 hours. No results for input(s): LIPASE, AMYLASE in the last 72 hours. Cardiac Enzymes No results for input(s): CKTOTAL, CKMB, CKMBINDEX, TROPONINI in the last 72 hours.  BNP: BNP (last 3 results) Recent Labs    04/10/18 0000 06/13/18 1856 07/12/18 1601  BNP 798.0* 853.6* 868.7*    ProBNP (last 3 results) No results for input(s): PROBNP in the last 8760 hours.   D-Dimer No results for input(s): DDIMER in the last 72 hours. Hemoglobin A1C No results for input(s): HGBA1C in the last 72 hours. Fasting Lipid Panel No results for input(s): CHOL, HDL, LDLCALC, TRIG, CHOLHDL, LDLDIRECT in the last 72 hours. Thyroid Function Tests No results for input(s): TSH, T4TOTAL, T3FREE, THYROIDAB in the last 72 hours.  Invalid input(s): FREET3  Other results:   Imaging    No results found.   Medications:     Scheduled Medications: . aspirin EC  81 mg Oral Daily  . atorvastatin  40 mg Oral q1800  . digoxin  0.125 mg Oral Daily  . enoxaparin (LOVENOX) injection  40 mg Subcutaneous Q24H  . famotidine  20 mg Oral BID  . pneumococcal 23 valent vaccine  0.5 mL Intramuscular Tomorrow-1000  . potassium chloride  20 mEq Oral Daily  . sacubitril-valsartan  1  tablet Oral BID  . sertraline  100 mg Oral Daily  . sodium chloride flush  3 mL Intravenous Q12H  . spironolactone  25 mg Oral Daily  . torsemide  60 mg Oral BID  . traZODone  50 mg Oral QHS    Infusions: . sodium chloride      PRN Medications: sodium chloride, acetaminophen, albuterol, ondansetron (ZOFRAN) IV, oxyCODONE-acetaminophen, sodium chloride flush   Assessment/Plan   1. Acute on chronic systolic CHF: Nonischemic cardiomyopathy.  LHC in 2018 with no significant coronary disease.  Cause of cardiomyopathy thought to be heavy ETOH + cocaine.  He has continued to drink and to use cocaine up  until recently.  He has had IV Lasix + metolazone, good UOP but weight not down as much as I would have expected.  He feels much better and on exam today, does not appear volume overloaded.  Creatinine stable 1.4   - Continue digoxin.  - Continue Entresto 24/26 bid and spironolactone 25 daily.  No BP room to titrate meds.  - Beta blocker held initially, can restart back low dose Coreg tomorrow if BP stable.  - Not candidate for advanced therapies/home inotropes with substance abuse.  Discussed need to avoid.  2. OSA: CPAP at night.  3. Polysubstance abuse: Counseled to stay off cocaine and ETOH as likely cause of his cardiomyopathy.   Ok for d/c today on   Torsemide 40 bid  Entresto 26/24 bid Carvedilol 3.125 bid  Arlyce Harman 25 daily Digoxin 0.125 daily   Metolazone 2.5 and kcl 16meq take prn only for 5 pound weight gain.   F/u with me in HF clinic. Also f/u at [facility]   Length of Stay: Cameron, MD  07/17/2018, 9:22 AM  Advanced Heart Failure Team Pager [phone] (M-F; 7a - 4p)  Please contact [facility] for night-coverage after hours (4p -7a ) and weekends on [email or web address]

## 2018-07-19 NOTE — Progress Notes (Signed)
Heart Failure TeleHealth Note  Due to national recommendations of social distancing due to [place] 19, telehealth visit is felt to be most appropriate for this patient at this time.  I discussed the limitations, risks, security and privacy concerns of performing an evaluation and management service by telephone and the availability of in person appointments. I also discussed with the patient that there may be a patient responsible charge related to this service. The patient expressed understanding and agreed to proceed.   ID:  Carlos Reap., DOB 27-Apr-1985, MRN [id number]  Location: Home  Provider location: [address] [place] Type of Visit: Established patient, hospital follow up  PCP: [facility] in [place]  Cardiologist:  Ida Rogue, MD Primary HF: Dr Haroldine Laws  Chief Complaint: Hospital f/u   History of Present Illness: Carlos Dock. is a 33 y.o. male with a history of chronic systolic HF, cocaine abuse, OSA on CPAP, tobacco abuse, obesity and hx of acute myocarditis 2018. Previously followed by the [facility] for cardiology.   Admitted 4/28-07/17/18 with volume overload. Echo showed EF down to 10-15%. Diuresed with IV lasix and metolazone then transitioned to torsemide 40 mg BID. HF meds optimized. He was given metolazone to take PRN. DC weight 276 lbs.  Patient presents via audio conferencing for a telehealth visit today.  Overall feeling fine. Energy a little better.  Mild SOB later in the day. Denies PND/Orthopnea. Appetite ok. No fever or chills. Weight at home 275- 276  pounds.  Uses CPAP. Taking all medications but has not been taking carvedilol. He says carvedilol was not sent in to the pharmacy.  He has not been smoking since discharge.  He denies cocaine and ETOH use. Currently on short term disability.   Pt denies symptoms of cough, fevers, chills, or new SOB worrisome for COVID 19.    Past Medical History:  Diagnosis Date  . CHF (congestive heart  failure) ([place])   . Cocaine abuse ([place])   . HFrEF (heart failure with reduced ejection fraction) ([place])    a. 10/2016 Echo: EF 15-20%, Gr2 DD, mildly dil LA/RA.  Marland Kitchen History of cardiac cath    a. 10/2016 Cath: LM nl, LAD min irregs, LCX nl, RCA nl, EF 15%.  . Morbid obesity ([place])   . Myocarditis ([place])    a. 10/2016 Admit w/ CHF and trop elevation; b. 10/2016 Echo: EF 15-20%; c. 10/2016 Cath: Min irregs in LAD otw nl cors, EF 15%, glob HK.  Marland Kitchen NICM (nonischemic cardiomyopathy) ([place])    a. 10/2016 Echo: EF 15-20%, Gr2 DD; b. 10/2016 Cath: Nl cors.  . Palpitations   . Sleep apnea    USES CPAP  . Tobacco abuse    Past Surgical History:  Procedure Laterality Date  . CORONARY ANGIOPLASTY    . RIGHT/LEFT HEART CATH AND CORONARY ANGIOGRAPHY N/A 10/27/2016   Procedure: RIGHT/LEFT HEART CATH AND CORONARY ANGIOGRAPHY;  Surgeon: Wellington Hampshire, MD;  Location: [place] [facility];  Service: Cardiovascular;  Laterality: N/A;  . WISDOM TOOTH EXTRACTION  2008     Current Outpatient Medications  Medication Sig Dispense Refill  . acetaminophen (TYLENOL) 500 MG tablet Take 2 tablets (1,000 mg total) by mouth every 6 (six) hours as needed. (Patient taking differently: Take 1,000 mg by mouth every 6 (six) hours as needed for mild pain or fever. ) 30 tablet 0  . albuterol (VENTOLIN HFA) 108 (90 Base) MCG/ACT inhaler Inhale 1-2 puffs into the lungs every  6 (six) hours as needed for wheezing or shortness of breath. 1 Inhaler 0  . aspirin EC 81 MG EC tablet Take 1 tablet (81 mg total) by mouth daily. 30 tablet 2  . atorvastatin (LIPITOR) 40 MG tablet Take 1 tablet (40 mg total) by mouth daily at 6 PM. 30 tablet 2  . carvedilol (COREG) 3.125 MG tablet Take 1 tablet (3.125 mg total) by mouth 2 (two) times daily with a meal. 60 tablet 2  . digoxin (LANOXIN) 0.125 MG tablet Take 1 tablet (0.125 mg total) by mouth daily. 30 tablet 0  . famotidine (PEPCID) 20 MG tablet Take 1 tablet (20 mg total) by mouth 2 (two) times  daily. 60 tablet 1  . metolazone (ZAROXOLYN) 2.5 MG tablet Take 1 tablet (2.5 mg total) by mouth daily as needed for up to 30 days. Take metolazone 2.5mg  x1 only for 5pound weight gain, please take potassium 11meq x1 with metolazone. 30 tablet 0  . potassium chloride SA (K-DUR) 20 MEQ tablet Take 1 tablet (20 mEq total) by mouth daily. Can take extra one if taking metolazone. 30 tablet 0  . sacubitril-valsartan (ENTRESTO) 24-26 MG Take 1 tablet by mouth 2 (two) times daily. 60 tablet 3  . sertraline (ZOLOFT) 100 MG tablet Take 100 mg by mouth daily.   2  . spironolactone (ALDACTONE) 25 MG tablet Take 25 mg by mouth daily.    Marland Kitchen torsemide (DEMADEX) 20 MG tablet Take 2 tablets (40 mg total) by mouth 2 (two) times daily. 120 tablet 7  . traZODone (DESYREL) 50 MG tablet Take 1 tablet (50 mg total) by mouth at bedtime. 30 tablet 0   No current facility-administered medications for this encounter.     Allergies:   Patient has no known allergies.   Social History:  The patient  reports that he has been smoking cigarettes. He has been smoking about 0.50 packs per day. He has never used smokeless tobacco. He reports current alcohol use. He reports previous drug use. Drug: Cocaine.   Family History:  The patient's family history includes Healthy in his mother; Heart failure in his father.   ROS:  Please see the history of present illness.   All other systems are personally reviewed and negative.    Exam:  Tele Health Call; Exam is subjective  General:  Speaks in full sentences. No resp difficulty. Lungs: Normal respiratory effort with conversation.  Abdomen: No distension per patient report Extremities: Pt denies edema. Neuro: Alert & oriented x 3.   Recent Labs: 02/04/2018: ALT 46 07/12/2018: B Natriuretic Peptide 868.7; Hemoglobin 13.9; Platelets 282 07/17/2018: BUN 27; Creatinine, Ser 1.45; Magnesium 2.2; Potassium 3.7; Sodium 135  Personally reviewed   Wt Readings from Last 3 Encounters:   07/17/18 125.3 kg (276 lb 4.8 oz)  06/13/18 129.3 kg (285 lb)  04/09/18 127 kg (280 lb)      ASSESSMENT AND PLAN: 1. Chronic systolic CHF: Nonischemic cardiomyopathy. Echo 10/2016 EF 15-20%. LHC in 2018 with no significant coronary disease.  Cause of cardiomyopathy thought to be heavy ETOH + cocaine.  He has continued to drink and to use cocaine up until recently.   - He does not have ICD.  - Echo 06/2018: EF 10-15%. Plan to repeat in 3-4 months after HF meds optimized.  - NYHA II-III Volume status sounds stabe.  - Continue torsemide 40 mg BID. Has metolazone to take PRN - Continue digoxin. Check dig level next visit.  - Continue Entresto 24/26 bid  -  Continue spironolactone 25 daily.   - Restart coreg 3.125 mg BID. This was not sent to the pharmacy at discharge.  - Not candidate for advanced therapies/home inotropes with substance abuse.   2. OSA: CPAP at night.  3. Polysubstance abuse: Counseled to stay off cocaine and ETOH as likely cause of his cardiomyopathy.   COVID screen The patient does not have any symptoms that suggest any further testing/ screening at this time.  Social distancing reinforced today.  Patient Risk: After full review of this patients clinical status, I feel that they are at moderate risk for cardiac decompensation at this time.  Orders/Follow up: Follow up in the HF clinic with APP in 3 weeks. Plan to check dig level and bmet. Reorder carvedilol 3.125 mg twice a day.   Today, I have spent 15 minutes with the patient with telehealth technology discussing the above medications and recommendations. Jeanmarie Hubert, NP  07/23/2018 9:41 AM   [facility] [address] Heart and [place] [address] [phone] (office) [phone] (fax)

## 2018-07-23 ENCOUNTER — Ambulatory Visit (HOSPITAL_COMMUNITY)
Admit: 2018-07-23 | Discharge: 2018-07-23 | Disposition: A | Discharge status: Home / Self Care | Payer: Commercial Managed Care - PPO | Attending: Internal Medicine | Admitting: Internal Medicine

## 2018-07-23 ENCOUNTER — Other Ambulatory Visit: Payer: Self-pay

## 2018-07-23 ENCOUNTER — Telehealth (HOSPITAL_COMMUNITY): Payer: Self-pay

## 2018-07-23 DIAGNOSIS — F1990 Other psychoactive substance use, unspecified, uncomplicated: Secondary | ICD-10-CM

## 2018-07-23 DIAGNOSIS — I5022 Chronic systolic (congestive) heart failure: Secondary | ICD-10-CM

## 2018-07-23 DIAGNOSIS — G4733 Obstructive sleep apnea (adult) (pediatric): Secondary | ICD-10-CM

## 2018-07-23 DIAGNOSIS — Z9989 Dependence on other enabling machines and devices: Secondary | ICD-10-CM

## 2018-07-23 DIAGNOSIS — I428 Other cardiomyopathies: Secondary | ICD-10-CM

## 2018-07-23 MED ORDER — CARVEDILOL 3.125 MG PO TABS: 3.1250 mg | ORAL_TABLET | Freq: Two times a day (BID) | ORAL | 0 refills | Status: DC

## 2018-07-23 NOTE — Patient Instructions (Addendum)
Follow up in the Heart Failure Clinic on 1 June @2 :30 pm, the garage code will be 8007.  Lab appointment has been made for 12 May @10 :30 am, the garage code is 8006.   Reorder Carvedilol 3.125 mg twice a day, this has been sent to [facility] on [address].  The office fax number is [phone]  The office number is [phone], option 2 for the nurses.

## 2018-07-23 NOTE — Telephone Encounter (Signed)
Reviewed AVS:  Follow up in the HF clinic with APP in 3 weeks. /scheduled 1 June @230   Plan to check dig level and bmet. /scheduled 12 May @1030    Reorder carvedilol 3.125 mg twice a day. /confirmed correct pharm   Please mail him a copy of Avs/completed  Also relayed office fax # to pt for disability paperwork

## 2018-07-23 NOTE — Addendum Note (Signed)
Encounter addended by: Jovita Kussmaul, RN on: 07/23/2018 10:45 AM  Actions taken: Order list changed, Diagnosis association updated, Clinical Note Signed

## 2018-07-27 ENCOUNTER — Other Ambulatory Visit (HOSPITAL_COMMUNITY): Payer: Commercial Managed Care - PPO

## 2018-08-16 ENCOUNTER — Encounter (HOSPITAL_COMMUNITY): Payer: Commercial Managed Care - PPO

## 2018-08-23 ENCOUNTER — Other Ambulatory Visit: Payer: Self-pay

## 2018-08-23 ENCOUNTER — Ambulatory Visit (HOSPITAL_COMMUNITY)
Admission: RE | Admit: 2018-08-23 | Discharge: 2018-08-23 | Disposition: A | Discharge status: Home / Self Care | Payer: Commercial Managed Care - PPO | Source: Ambulatory Visit | Attending: Cardiology | Admitting: Cardiology

## 2018-08-23 ENCOUNTER — Other Ambulatory Visit (HOSPITAL_COMMUNITY): Payer: Self-pay | Admitting: *Deleted

## 2018-08-23 DIAGNOSIS — F1990 Other psychoactive substance use, unspecified, uncomplicated: Secondary | ICD-10-CM

## 2018-08-23 DIAGNOSIS — I428 Other cardiomyopathies: Secondary | ICD-10-CM

## 2018-08-23 DIAGNOSIS — I5022 Chronic systolic (congestive) heart failure: Secondary | ICD-10-CM

## 2018-08-23 DIAGNOSIS — G4733 Obstructive sleep apnea (adult) (pediatric): Secondary | ICD-10-CM

## 2018-08-23 DIAGNOSIS — Z9989 Dependence on other enabling machines and devices: Secondary | ICD-10-CM

## 2018-08-23 MED ORDER — DIGOXIN 125 MCG PO TABS: 0.1250 mg | ORAL_TABLET | Freq: Every day | ORAL | 6 refills | Status: AC

## 2018-08-23 MED ORDER — SPIRONOLACTONE 25 MG PO TABS: 25.0000 mg | ORAL_TABLET | Freq: Every day | ORAL | 6 refills | Status: DC

## 2018-08-23 MED ORDER — TORSEMIDE 20 MG PO TABS: 40.0000 mg | ORAL_TABLET | Freq: Two times a day (BID) | ORAL | 6 refills | Status: AC

## 2018-08-23 MED ORDER — SPIRONOLACTONE 25 MG PO TABS: 25.0000 mg | ORAL_TABLET | Freq: Every day | ORAL | 6 refills | Status: AC

## 2018-08-23 MED ORDER — CARVEDILOL 3.125 MG PO TABS: 3.1250 mg | ORAL_TABLET | Freq: Two times a day (BID) | ORAL | 6 refills | Status: DC

## 2018-08-23 MED ORDER — TORSEMIDE 20 MG PO TABS: 40.0000 mg | ORAL_TABLET | Freq: Two times a day (BID) | ORAL | 6 refills | Status: DC

## 2018-08-23 MED ORDER — CARVEDILOL 3.125 MG PO TABS: 3.1250 mg | ORAL_TABLET | Freq: Two times a day (BID) | ORAL | 6 refills | Status: AC

## 2018-08-23 MED ORDER — ATORVASTATIN CALCIUM 40 MG PO TABS: 40.0000 mg | ORAL_TABLET | Freq: Every day | ORAL | 6 refills | Status: DC

## 2018-08-23 MED ORDER — DIGOXIN 125 MCG PO TABS: 0.1250 mg | ORAL_TABLET | Freq: Every day | ORAL | 6 refills | Status: DC

## 2018-08-23 MED ORDER — POTASSIUM CHLORIDE CRYS ER 20 MEQ PO TBCR: 20.0000 meq | EXTENDED_RELEASE_TABLET | Freq: Every day | ORAL | 6 refills | Status: AC

## 2018-08-23 MED ORDER — SACUBITRIL-VALSARTAN 24-26 MG PO TABS: 1.0000 | ORAL_TABLET | Freq: Two times a day (BID) | ORAL | 6 refills | Status: DC

## 2018-08-23 MED ORDER — ATORVASTATIN CALCIUM 40 MG PO TABS: 40.0000 mg | ORAL_TABLET | Freq: Every day | ORAL | 6 refills | Status: AC

## 2018-08-23 MED ORDER — POTASSIUM CHLORIDE CRYS ER 20 MEQ PO TBCR: 20.0000 meq | EXTENDED_RELEASE_TABLET | Freq: Every day | ORAL | 6 refills | Status: DC

## 2018-08-23 MED ORDER — SACUBITRIL-VALSARTAN 24-26 MG PO TABS: 1.0000 | ORAL_TABLET | Freq: Two times a day (BID) | ORAL | 6 refills | Status: AC

## 2018-08-23 MED FILL — TORSEMIDE 20 MG TABLET: 20 | 30 days supply | Qty: 120 | Fill #0

## 2018-08-23 MED FILL — ATORVASTATIN 40 MG TABLET: 40 | 34 days supply | Qty: 34 | Fill #0

## 2018-08-23 MED FILL — SPIRONOLACTONE 25 MG TABLET: 25 | 34 days supply | Qty: 34 | Fill #0

## 2018-08-23 MED FILL — CARVEDILOL 3.125 MG TABLET: 3.125 | 34 days supply | Qty: 68 | Fill #0

## 2018-08-23 MED FILL — DIGOXIN 0.125 MG TABLET: 125 | 34 days supply | Qty: 34 | Fill #0

## 2018-08-23 MED FILL — POTASSIUM CHLORIDE CRYS ER: 20 | 34 days supply | Qty: 34 | Fill #0

## 2018-08-23 NOTE — Addendum Note (Signed)
Encounter addended by: Scarlette Calico, RN on: 08/23/2018 5:23 PM  Actions taken: Clinical Note Signed

## 2018-08-23 NOTE — Telephone Encounter (Signed)
Pt request RX for all heart meds be faxed to [facility] at [phone], rx printed out will have Dr Haroldine Laws sign and fax

## 2018-08-23 NOTE — Addendum Note (Signed)
Addended by: Scarlette Calico on: 08/23/2018 05:22 PM   Modules accepted: Orders

## 2018-08-23 NOTE — Progress Notes (Signed)
Spoke w/pt and sch f/u appt

## 2018-08-23 NOTE — Progress Notes (Signed)
Heart Failure TeleHealth Note  Due to national recommendations of social distancing due to [place] 19, Audio/video telehealth visit is felt to be most appropriate for this patient at this time.  See MyChart message from today for patient consent regarding telehealth for [facility].  Date:  08/23/2018   ID:  Carlos Reap., DOB 01-Sep-1985, MRN [id number]  Location: Home  Provider location:  Advanced Heart Failure Type of Visit: Established patient  PCP:  Patient, No Pcp Per  Cardiologist:  Ida Rogue, MD Primary HF: Dr Haroldine Laws   Chief Complaint: Heart Failure   History of Present Illness: Carlos Penson. is a 33 y.o. male with a history of chronic systolic HF, cocaine abuse, OSA on CPAP, tobacco abuse, obesity and hx of acute myocarditis 2018. Previously followed by the [facility] for cardiology.   Admitted 4/28-07/17/18 with volume overload. Echo showed EF down to 10-15%. Diuresed with IV lasix and metolazone then transitioned to torsemide 40 mg BID. HF meds optimized. He was given metolazone to take PRN. DC weight 276 lbs.  He presents via [employer], [employer] (consulting) for a telehealth visit today.  Last visit carvedilol was restarted at 3. 125 mg twice a day however he did not start . He has been out spiro, torsemide, atorvastatin, and potassium.  Feeling fair. Says he has started having some shortness of breath. Denies PND/Orthopnea. Appetite ok. No fever or chills. Weight at home 276-280 pounds. Says he cant afford his medications.   he denies symptoms worrisome for COVID 19.   Past Medical History:  Diagnosis Date  . CHF (congestive heart failure) ([place])   . Cocaine abuse ([place])   . HFrEF (heart failure with reduced ejection fraction) ([place])    a. 10/2016 Echo: EF 15-20%, Gr2 DD, mildly dil LA/RA.  Marland Kitchen History of cardiac cath    a. 10/2016 Cath: LM nl, LAD min irregs, LCX nl, RCA nl, EF 15%.  . Morbid obesity ([place])   . Myocarditis ([place])    a. 10/2016 Admit  w/ CHF and trop elevation; b. 10/2016 Echo: EF 15-20%; c. 10/2016 Cath: Min irregs in LAD otw nl cors, EF 15%, glob HK.  Marland Kitchen NICM (nonischemic cardiomyopathy) ([place])    a. 10/2016 Echo: EF 15-20%, Gr2 DD; b. 10/2016 Cath: Nl cors.  . Palpitations   . Sleep apnea    USES CPAP  . Tobacco abuse    Past Surgical History:  Procedure Laterality Date  . CORONARY ANGIOPLASTY    . RIGHT/LEFT HEART CATH AND CORONARY ANGIOGRAPHY N/A 10/27/2016   Procedure: RIGHT/LEFT HEART CATH AND CORONARY ANGIOGRAPHY;  Surgeon: Wellington Hampshire, MD;  Location: [facility] CV [facility];  Service: Cardiovascular;  Laterality: N/A;  . WISDOM TOOTH EXTRACTION  2008     Current Outpatient Medications  Medication Sig Dispense Refill  . acetaminophen (TYLENOL) 500 MG tablet Take 2 tablets (1,000 mg total) by mouth every 6 (six) hours as needed. (Patient taking differently: Take 1,000 mg by mouth every 6 (six) hours as needed for mild pain or fever. ) 30 tablet 0  . albuterol (VENTOLIN HFA) 108 (90 Base) MCG/ACT inhaler Inhale 1-2 puffs into the lungs every 6 (six) hours as needed for wheezing or shortness of breath. 1 Inhaler 0  . aspirin EC 81 MG EC tablet Take 1 tablet (81 mg total) by mouth daily. 30 tablet 2  . atorvastatin (LIPITOR) 40 MG tablet Take 1 tablet (40 mg total) by mouth daily at 6 PM. 30 tablet  6  . carvedilol (COREG) 3.125 MG tablet Take 1 tablet (3.125 mg total) by mouth 2 (two) times daily with a meal. 60 tablet 6  . digoxin (LANOXIN) 0.125 MG tablet Take 1 tablet (0.125 mg total) by mouth daily. 30 tablet 6  . famotidine (PEPCID) 20 MG tablet Take 1 tablet (20 mg total) by mouth 2 (two) times daily. 60 tablet 1  . metolazone (ZAROXOLYN) 2.5 MG tablet Take 1 tablet (2.5 mg total) by mouth daily as needed for up to 30 days. Take metolazone 2.5mg  x1 only for 5pound weight gain, please take potassium 81meq x1 with metolazone. (Patient not taking: Reported on 07/23/2018) 30 tablet 0  . potassium chloride SA (K-DUR) 20  MEQ tablet Take 1 tablet (20 mEq total) by mouth daily. Can take extra one if taking metolazone. 40 tablet 6  . sacubitril-valsartan (ENTRESTO) 24-26 MG Take 1 tablet by mouth 2 (two) times daily. 60 tablet 6  . sertraline (ZOLOFT) 100 MG tablet Take 100 mg by mouth daily.   2  . spironolactone (ALDACTONE) 25 MG tablet Take 1 tablet (25 mg total) by mouth daily. 30 tablet 6  . torsemide (DEMADEX) 20 MG tablet Take 2 tablets (40 mg total) by mouth 2 (two) times daily. 120 tablet 6  . traZODone (DESYREL) 50 MG tablet Take 1 tablet (50 mg total) by mouth at bedtime. 30 tablet 0   No current facility-administered medications for this encounter.     Allergies:   Patient has no known allergies.   Social History:  The patient  reports that he has been smoking cigarettes. He has been smoking about 0.50 packs per day. He has never used smokeless tobacco. He reports current alcohol use. He reports previous drug use. Drug: Cocaine.   Family History:  The patient's family history includes Healthy in his mother; Heart failure in his father.   ROS:  Please see the history of present illness.   All other systems are personally reviewed and negative.   Exam:  Tele Health Call; Exam is subjective  General:  Speaks in full sentences. No resp difficulty. Lungs: Normal respiratory effort with conversation.  Abdomen: Non-distended per patient report Extremities: Pt denies edema. Neuro: Alert & oriented x 3.   Recent Labs: 02/04/2018: ALT 46 07/12/2018: B Natriuretic Peptide 868.7; Hemoglobin 13.9; Platelets 282 07/17/2018: BUN 27; Creatinine, Ser 1.45; Magnesium 2.2; Potassium 3.7; Sodium 135  Personally reviewed   Wt Readings from Last 3 Encounters:  07/17/18 125.3 kg (276 lb 4.8 oz)  06/13/18 129.3 kg (285 lb)  04/09/18 127 kg (280 lb)      ASSESSMENT AND PLAN:  1. Chronic systolic CHF: Nonischemic cardiomyopathy. Echo 10/2016 EF 15-20%.LHC in 2018 with no significant coronary disease. Cause of  cardiomyopathy thought to be heavy ETOH + cocaine. He has continued to drink and to use cocaine up until recently.  - He does not have ICD.  - Echo 06/2018: EF 10-15%. Plan to repeat in 3-4 months after HF meds optimized.  - NYHA III. Restart HF medications.  Today we will provide digoxin, torsemide, spiro, and coreg through the HF fund.  Hopefully he will be able to obtain HF meds soon from the [place] in [place].  -- Continue Entresto 24/26 bid  -  Not candidate for advanced therapies/home inotropes with substance abuse.   2. OSA: CPAP at night.  3. Polysubstance abuse: Discussed again today. Counseled to stay off cocaine and ETOH as likely cause of his cardiomyopathy.  COVID screen The patient does not have any symptoms that suggest any further testing/ screening at this time.  Social distancing reinforced today.  Patient Risk: After full review of this patients clinical status, I feel that they are at moderate risk for cardiac decompensation at this time.  Relevant cardiac medications were reviewed at length with the patient today. The patient does not have concerns regarding their medications at this time.   The following changes were made today:  Refill HF meds through HF fund.  Recommended follow-up:  F/U HF clinic in 2 weeks.Idont feel like we can manage him with HF Paramedicine and in person visit.  Plan to check BMET at that time.   Today, I have spent 15 minutes with the patient with telehealth technology discussing the above issues .    Jeanmarie Hubert, NP  08/23/2018 11:14 AM  [facility] [address] Heart and [place] [address] [phone] (office) [phone] (fax)

## 2018-08-25 ENCOUNTER — Encounter (HOSPITAL_COMMUNITY): Payer: Self-pay | Admitting: *Deleted

## 2018-08-25 NOTE — Progress Notes (Signed)
Pt's forms from [employer], completed and signed by Dr Haroldine Laws, forms along w/past OV notes faxed to them at [phone].  Pt is aware and copy of forms mailed to him.

## 2018-08-27 ENCOUNTER — Encounter (HOSPITAL_COMMUNITY): Payer: Self-pay

## 2018-08-27 ENCOUNTER — Emergency Department (HOSPITAL_COMMUNITY): Payer: No Typology Code available for payment source

## 2018-08-27 ENCOUNTER — Other Ambulatory Visit: Payer: Self-pay

## 2018-08-27 ENCOUNTER — Emergency Department (HOSPITAL_COMMUNITY)
Admission: EM | Admit: 2018-08-27 | Discharge: 2018-08-28 | Disposition: A | Discharge status: Home / Self Care | Payer: No Typology Code available for payment source | Attending: Emergency Medicine | Admitting: Emergency Medicine

## 2018-08-27 DIAGNOSIS — R109 Unspecified abdominal pain: Secondary | ICD-10-CM | POA: Insufficient documentation

## 2018-08-27 DIAGNOSIS — G8911 Acute pain due to trauma: Secondary | ICD-10-CM | POA: Insufficient documentation

## 2018-08-27 DIAGNOSIS — S30811A Abrasion of abdominal wall, initial encounter: Secondary | ICD-10-CM | POA: Insufficient documentation

## 2018-08-27 DIAGNOSIS — S30810A Abrasion of lower back and pelvis, initial encounter: Secondary | ICD-10-CM | POA: Insufficient documentation

## 2018-08-27 DIAGNOSIS — Y999 Unspecified external cause status: Secondary | ICD-10-CM | POA: Insufficient documentation

## 2018-08-27 DIAGNOSIS — S40811A Abrasion of right upper arm, initial encounter: Secondary | ICD-10-CM | POA: Diagnosis not present

## 2018-08-27 DIAGNOSIS — Z23 Encounter for immunization: Secondary | ICD-10-CM | POA: Insufficient documentation

## 2018-08-27 DIAGNOSIS — S40812A Abrasion of left upper arm, initial encounter: Secondary | ICD-10-CM | POA: Diagnosis not present

## 2018-08-27 DIAGNOSIS — S20412A Abrasion of left back wall of thorax, initial encounter: Secondary | ICD-10-CM | POA: Insufficient documentation

## 2018-08-27 DIAGNOSIS — Y929 Unspecified place or not applicable: Secondary | ICD-10-CM | POA: Diagnosis not present

## 2018-08-27 DIAGNOSIS — R0789 Other chest pain: Secondary | ICD-10-CM | POA: Diagnosis not present

## 2018-08-27 DIAGNOSIS — T07XXXA Unspecified multiple injuries, initial encounter: Secondary | ICD-10-CM | POA: Diagnosis present

## 2018-08-27 DIAGNOSIS — Y939 Activity, unspecified: Secondary | ICD-10-CM | POA: Diagnosis not present

## 2018-08-27 MED ORDER — TETANUS-DIPHTH-ACELL PERTUSSIS 5-2.5-18.5 LF-MCG/0.5 IM SUSP
0.5000 mL | Freq: Once | INTRAMUSCULAR | Status: AC
  Administered 2018-08-28: 0.5 mL via INTRAMUSCULAR
  Filled 2018-08-27: qty 0.5

## 2018-08-27 MED ORDER — SODIUM CHLORIDE 0.9 % IV BOLUS
1000.0000 mL | Freq: Once | INTRAVENOUS | Status: AC
  Administered 2018-08-28: 1000 mL via INTRAVENOUS

## 2018-08-27 MED ORDER — FENTANYL CITRATE (PF) 100 MCG/2ML IJ SOLN
100.0000 ug | Freq: Once | INTRAMUSCULAR | Status: AC
  Administered 2018-08-27: 100 ug via INTRAVENOUS
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
## 2018-08-27 NOTE — ED Triage Notes (Signed)
Pt c/o generalized pain. Given 158mcg Fentanyl IN by EMS in enroute. EMS unable to C-collar due to pt size. A&O x4 on arrival.

## 2018-08-28 ENCOUNTER — Emergency Department (HOSPITAL_COMMUNITY): Payer: No Typology Code available for payment source

## 2018-08-28 LAB — CBC
HCT: 47.9 % (ref 39.0–52.0)
Hemoglobin: 17.3 g/dL — ABNORMAL HIGH (ref 13.0–17.0)
MCH: 30.8 pg (ref 26.0–34.0)
MCHC: 36.1 g/dL — ABNORMAL HIGH (ref 30.0–36.0)
MCV: 85.2 fL (ref 80.0–100.0)
Platelets: 322 10*3/uL (ref 150–400)
RBC: 5.62 MIL/uL (ref 4.22–5.81)
RDW: 11.3 % — ABNORMAL LOW (ref 11.5–15.5)
WBC: 10.8 10*3/uL — ABNORMAL HIGH (ref 4.0–10.5)
nRBC: 0 % (ref 0.0–0.2)

## 2018-08-28 LAB — URINALYSIS, ROUTINE W REFLEX MICROSCOPIC
Bacteria, UA: NONE SEEN
Bilirubin Urine: NEGATIVE
Glucose, UA: >=500 mg/dL — AB
Ketones, ur: NEGATIVE mg/dL
Leukocytes,Ua: NEGATIVE
Nitrite: NEGATIVE
Protein, ur: 30 mg/dL — AB
Specific Gravity, Urine: 1.015 (ref 1.005–1.030)
pH: 6 (ref 5.0–8.0)

## 2018-08-28 LAB — PROTIME-INR
INR: 0.9 (ref 0.8–1.2)
Prothrombin Time: 11.9 seconds (ref 11.4–15.2)

## 2018-08-28 LAB — COMPREHENSIVE METABOLIC PANEL
ALT: 59 U/L — ABNORMAL HIGH (ref 0–44)
AST: 57 U/L — ABNORMAL HIGH (ref 15–41)
Albumin: 3.7 g/dL (ref 3.5–5.0)
Alkaline Phosphatase: 118 U/L (ref 38–126)
Anion gap: 15 (ref 5–15)
BUN: 18 mg/dL (ref 6–20)
CO2: 25 mmol/L (ref 22–32)
Calcium: 9.1 mg/dL (ref 8.9–10.3)
Chloride: 95 mmol/L — ABNORMAL LOW (ref 98–111)
Creatinine, Ser: 1.43 mg/dL — ABNORMAL HIGH (ref 0.61–1.24)
GFR calc Af Amer: >60 mL/min (ref >60)
GFR calc non Af Amer: >60 mL/min (ref >60)
Glucose, Bld: 289 mg/dL — ABNORMAL HIGH (ref 70–99)
Potassium: 2.9 mmol/L — ABNORMAL LOW (ref 3.5–5.1)
Sodium: 135 mmol/L (ref 135–145)
Total Bilirubin: 0.7 mg/dL (ref 0.3–1.2)
Total Protein: 7.6 g/dL (ref 6.5–8.1)

## 2018-08-28 LAB — SAMPLE TO BLOOD BANK

## 2018-08-28 LAB — LACTIC ACID, PLASMA: Lactic Acid, Venous: 4.5 mmol/L (ref 0.5–1.9)

## 2018-08-28 LAB — CDS SEROLOGY

## 2018-08-28 LAB — ETHANOL: Alcohol, Ethyl (B): <10 mg/dL (ref <10)

## 2018-08-28 MED ORDER — BACITRACIN ZINC 500 UNIT/GM EX OINT
1.0000 "application " | TOPICAL_OINTMENT | Freq: Two times a day (BID) | CUTANEOUS | Status: DC
  Administered 2018-08-28: 1 via TOPICAL

## 2018-08-28 MED ORDER — HYDROCODONE-ACETAMINOPHEN 5-325 MG PO TABS
2.0000 | ORAL_TABLET | Freq: Once | ORAL | Status: AC
  Administered 2018-08-28: 2 via ORAL
  Filled 2018-08-28: qty 2

## 2018-08-28 MED ORDER — BACITRACIN ZINC 500 UNIT/GM EX OINT: 1.0000 "application " | TOPICAL_OINTMENT | Freq: Two times a day (BID) | CUTANEOUS | 0 refills | Status: AC

## 2018-08-28 MED ORDER — HYDROCODONE-ACETAMINOPHEN 5-325 MG PO TABS: 1.0000 | ORAL_TABLET | Freq: Four times a day (QID) | ORAL | 0 refills | Status: DC | PRN

## 2018-08-28 MED ORDER — FENTANYL CITRATE (PF) 100 MCG/2ML IJ SOLN
100.0000 ug | Freq: Once | INTRAMUSCULAR | Status: AC
  Administered 2018-08-28: 100 ug via INTRAVENOUS
  Filled 2018-08-28: qty 2

## 2018-08-28 MED ORDER — IOHEXOL 300 MG/ML  SOLN
100.0000 mL | Freq: Once | INTRAMUSCULAR | Status: AC | PRN
  Administered 2018-08-28: 100 mL via INTRAVENOUS
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
## 2018-08-28 NOTE — ED Notes (Signed)
Pt ambulated to bathroom with no assistance. Pt tolerated well.

## 2018-08-30 ENCOUNTER — Encounter (HOSPITAL_COMMUNITY): Payer: Self-pay | Admitting: General Practice

## 2018-09-06 ENCOUNTER — Encounter (HOSPITAL_COMMUNITY): Payer: Commercial Managed Care - PPO | Admitting: Internal Medicine

## 2018-09-13 MED ORDER — POTASSIUM CHLORIDE CRYS ER 10 MEQ PO TBCR
20.00 | EXTENDED_RELEASE_TABLET | ORAL | Status: DC
Start: 2018-09-12 — End: 2018-09-13

## 2018-09-13 MED ORDER — GENERIC EXTERNAL MEDICATION
12.50 | Status: DC
Start: ? — End: 2018-09-13

## 2018-09-13 MED ORDER — GENERIC EXTERNAL MEDICATION
Status: DC
Start: ? — End: 2018-09-13

## 2018-09-13 MED ORDER — ATORVASTATIN CALCIUM 40 MG PO TABS
40.00 | ORAL_TABLET | ORAL | Status: DC
Start: 2018-09-11 — End: 2018-09-13

## 2018-09-13 MED ORDER — ASPIRIN 81 MG PO CHEW
81.00 | CHEWABLE_TABLET | ORAL | Status: DC
Start: 2018-09-12 — End: 2018-09-13

## 2018-09-13 MED ORDER — MELATONIN 3 MG PO TABS
3.00 | ORAL_TABLET | ORAL | Status: DC
Start: ? — End: 2018-09-13

## 2018-09-13 MED ORDER — ENOXAPARIN SODIUM 40 MG/0.4ML ~~LOC~~ SOLN
40.00 | SUBCUTANEOUS | Status: DC
Start: 2018-09-11 — End: 2018-09-13

## 2018-09-13 MED ORDER — CARVEDILOL 3.125 MG PO TABS
3.13 | ORAL_TABLET | ORAL | Status: DC
Start: 2018-09-11 — End: 2018-09-13

## 2018-09-13 MED ORDER — GLUCOSE BLOOD VI STRP
ORAL_STRIP | Status: DC
Start: ? — End: 2018-09-13

## 2018-09-13 MED ORDER — INSULIN LISPRO 100 UNIT/ML ~~LOC~~ SOLN
1.00 | SUBCUTANEOUS | Status: DC
Start: 2018-09-11 — End: 2018-09-13

## 2018-09-13 MED ORDER — DOCUSATE SODIUM 100 MG PO CAPS
100.00 | ORAL_CAPSULE | ORAL | Status: DC
Start: 2018-09-11 — End: 2018-09-13

## 2018-09-13 MED ORDER — VITAMIN D (ERGOCALCIFEROL) 1.25 MG (50000 UNIT) PO CAPS
50000.00 | ORAL_CAPSULE | ORAL | Status: DC
Start: 2018-09-15 — End: 2018-09-13

## 2018-09-13 MED ORDER — EUCERIN EX CREA
1.00 | TOPICAL_CREAM | CUTANEOUS | Status: DC
Start: ? — End: 2018-09-13

## 2018-09-13 MED ORDER — POLYETHYLENE GLYCOL 3350 17 G PO PACK
17.00 | PACK | ORAL | Status: DC
Start: ? — End: 2018-09-13

## 2018-09-13 MED ORDER — OXYCODONE HCL 5 MG PO TABS
15.00 | ORAL_TABLET | ORAL | Status: DC
Start: ? — End: 2018-09-13

## 2018-09-13 MED ORDER — GENERIC EXTERNAL MEDICATION
1.00 | Status: DC
Start: 2018-09-12 — End: 2018-09-13

## 2018-09-13 MED ORDER — SPIRONOLACTONE 25 MG PO TABS
25.00 | ORAL_TABLET | ORAL | Status: DC
Start: 2018-09-12 — End: 2018-09-13

## 2018-09-13 MED ORDER — GABAPENTIN 300 MG PO CAPS
300.00 | ORAL_CAPSULE | ORAL | Status: DC
Start: 2018-09-11 — End: 2018-09-13

## 2018-09-13 MED ORDER — INSULIN GLARGINE 100 UNIT/ML ~~LOC~~ SOLN
40.00 | SUBCUTANEOUS | Status: DC
Start: 2018-09-11 — End: 2018-09-13

## 2018-09-13 MED ORDER — DIGOXIN 125 MCG PO TABS
125.00 | ORAL_TABLET | ORAL | Status: DC
Start: 2018-09-12 — End: 2018-09-13

## 2018-09-13 MED ORDER — METFORMIN HCL 500 MG PO TABS
500.00 | ORAL_TABLET | ORAL | Status: DC
Start: 2018-09-11 — End: 2018-09-13

## 2018-09-13 MED ORDER — IPRATROPIUM-ALBUTEROL 0.5-2.5 (3) MG/3ML IN SOLN
3.00 | RESPIRATORY_TRACT | Status: DC
Start: ? — End: 2018-09-13

## 2018-09-13 MED ORDER — BACITRACIN 500 UNIT/GM EX OINT
1.00 | TOPICAL_OINTMENT | CUTANEOUS | Status: DC
Start: ? — End: 2018-09-13

## 2018-09-13 MED ORDER — INSULIN LISPRO 100 UNIT/ML ~~LOC~~ SOLN
16.00 | SUBCUTANEOUS | Status: DC
Start: 2018-09-11 — End: 2018-09-13

## 2018-09-13 MED ORDER — ONDANSETRON HCL 8 MG PO TABS
4.00 | ORAL_TABLET | ORAL | Status: DC
Start: ? — End: 2018-09-13

## 2018-09-13 MED ORDER — TRAZODONE HCL 50 MG PO TABS
50.00 | ORAL_TABLET | ORAL | Status: DC
Start: 2018-09-11 — End: 2018-09-13

## 2018-09-13 MED ORDER — SILVER SULFADIAZINE 1 % EX CREA
1.00 | TOPICAL_CREAM | CUTANEOUS | Status: DC
Start: 2018-09-12 — End: 2018-09-13

## 2018-09-13 MED ORDER — SACUBITRIL-VALSARTAN 24-26 MG PO TABS
1.00 | ORAL_TABLET | ORAL | Status: DC
Start: 2018-09-11 — End: 2018-09-13

## 2018-09-13 MED ORDER — GENERIC EXTERNAL MEDICATION
500.00 | Status: DC
Start: 2018-09-12 — End: 2018-09-13

## 2018-09-13 MED ORDER — SERTRALINE HCL 100 MG PO TABS
100.00 | ORAL_TABLET | ORAL | Status: DC
Start: 2018-09-12 — End: 2018-09-13

## 2018-09-13 MED ORDER — TORSEMIDE 20 MG PO TABS
20.00 | ORAL_TABLET | ORAL | Status: DC
Start: 2018-09-12 — End: 2018-09-13

## 2018-09-13 MED ORDER — HYDROXYZINE HCL 25 MG PO TABS
50.00 | ORAL_TABLET | ORAL | Status: DC
Start: ? — End: 2018-09-13

## 2018-12-13 ENCOUNTER — Emergency Department (HOSPITAL_BASED_OUTPATIENT_CLINIC_OR_DEPARTMENT_OTHER): Payer: HRSA Program

## 2018-12-13 ENCOUNTER — Inpatient Hospital Stay (HOSPITAL_BASED_OUTPATIENT_CLINIC_OR_DEPARTMENT_OTHER)
Admission: EM | Admit: 2018-12-13 | Discharge: 2018-12-18 | DRG: 177 | Disposition: A | Discharge status: Home / Self Care | Payer: HRSA Program | Attending: Internal Medicine | Admitting: Internal Medicine

## 2018-12-13 ENCOUNTER — Encounter (HOSPITAL_BASED_OUTPATIENT_CLINIC_OR_DEPARTMENT_OTHER): Payer: Self-pay | Admitting: Emergency Medicine

## 2018-12-13 ENCOUNTER — Other Ambulatory Visit: Payer: Self-pay

## 2018-12-13 DIAGNOSIS — G4733 Obstructive sleep apnea (adult) (pediatric): Secondary | ICD-10-CM | POA: Diagnosis present

## 2018-12-13 DIAGNOSIS — Z7982 Long term (current) use of aspirin: Secondary | ICD-10-CM | POA: Diagnosis not present

## 2018-12-13 DIAGNOSIS — U071 COVID-19: Secondary | ICD-10-CM | POA: Diagnosis present

## 2018-12-13 DIAGNOSIS — R0602 Shortness of breath: Secondary | ICD-10-CM

## 2018-12-13 DIAGNOSIS — J1289 Other viral pneumonia: Secondary | ICD-10-CM | POA: Diagnosis present

## 2018-12-13 DIAGNOSIS — F1410 Cocaine abuse, uncomplicated: Secondary | ICD-10-CM | POA: Diagnosis present

## 2018-12-13 DIAGNOSIS — N182 Chronic kidney disease, stage 2 (mild): Secondary | ICD-10-CM | POA: Diagnosis present

## 2018-12-13 DIAGNOSIS — I2510 Atherosclerotic heart disease of native coronary artery without angina pectoris: Secondary | ICD-10-CM | POA: Diagnosis present

## 2018-12-13 DIAGNOSIS — E1122 Type 2 diabetes mellitus with diabetic chronic kidney disease: Secondary | ICD-10-CM | POA: Diagnosis present

## 2018-12-13 DIAGNOSIS — I130 Hypertensive heart and chronic kidney disease with heart failure and stage 1 through stage 4 chronic kidney disease, or unspecified chronic kidney disease: Secondary | ICD-10-CM | POA: Diagnosis present

## 2018-12-13 DIAGNOSIS — Z9861 Coronary angioplasty status: Secondary | ICD-10-CM | POA: Diagnosis not present

## 2018-12-13 DIAGNOSIS — F17210 Nicotine dependence, cigarettes, uncomplicated: Secondary | ICD-10-CM | POA: Diagnosis present

## 2018-12-13 DIAGNOSIS — I428 Other cardiomyopathies: Secondary | ICD-10-CM | POA: Diagnosis present

## 2018-12-13 DIAGNOSIS — Z6838 Body mass index (BMI) 38.0-38.9, adult: Secondary | ICD-10-CM

## 2018-12-13 DIAGNOSIS — Z8249 Family history of ischemic heart disease and other diseases of the circulatory system: Secondary | ICD-10-CM

## 2018-12-13 DIAGNOSIS — I5022 Chronic systolic (congestive) heart failure: Secondary | ICD-10-CM | POA: Diagnosis present

## 2018-12-13 DIAGNOSIS — I959 Hypotension, unspecified: Secondary | ICD-10-CM | POA: Diagnosis not present

## 2018-12-13 DIAGNOSIS — E669 Obesity, unspecified: Secondary | ICD-10-CM | POA: Diagnosis present

## 2018-12-13 DIAGNOSIS — I5042 Chronic combined systolic (congestive) and diastolic (congestive) heart failure: Secondary | ICD-10-CM | POA: Diagnosis present

## 2018-12-13 DIAGNOSIS — J9601 Acute respiratory failure with hypoxia: Secondary | ICD-10-CM | POA: Diagnosis present

## 2018-12-13 DIAGNOSIS — J988 Other specified respiratory disorders: Secondary | ICD-10-CM | POA: Diagnosis not present

## 2018-12-13 DIAGNOSIS — I509 Heart failure, unspecified: Secondary | ICD-10-CM | POA: Diagnosis not present

## 2018-12-13 LAB — CBC WITH DIFFERENTIAL/PLATELET
Abs Immature Granulocytes: 0.01 10*3/uL (ref 0.00–0.07)
Basophils Absolute: 0 10*3/uL (ref 0.0–0.1)
Basophils Relative: 1 %
Eosinophils Absolute: 0 10*3/uL (ref 0.0–0.5)
Eosinophils Relative: 0 %
HCT: 40.5 % (ref 39.0–52.0)
Hemoglobin: 13.3 g/dL (ref 13.0–17.0)
Immature Granulocytes: 0 %
Lymphocytes Relative: 22 %
Lymphs Abs: 1.1 10*3/uL (ref 0.7–4.0)
MCH: 30.7 pg (ref 26.0–34.0)
MCHC: 32.8 g/dL (ref 30.0–36.0)
MCV: 93.5 fL (ref 80.0–100.0)
Monocytes Absolute: 0.3 10*3/uL (ref 0.1–1.0)
Monocytes Relative: 6 %
Neutro Abs: 3.7 10*3/uL (ref 1.7–7.7)
Neutrophils Relative %: 71 %
Platelets: 227 10*3/uL (ref 150–400)
RBC: 4.33 MIL/uL (ref 4.22–5.81)
RDW: 12.2 % (ref 11.5–15.5)
WBC: 5.2 10*3/uL (ref 4.0–10.5)
nRBC: 0 % (ref 0.0–0.2)

## 2018-12-13 LAB — COMPREHENSIVE METABOLIC PANEL
ALT: 45 U/L — ABNORMAL HIGH (ref 0–44)
AST: 35 U/L (ref 15–41)
Albumin: 3.8 g/dL (ref 3.5–5.0)
Alkaline Phosphatase: 68 U/L (ref 38–126)
Anion gap: 11 (ref 5–15)
BUN: 12 mg/dL (ref 6–20)
CO2: 26 mmol/L (ref 22–32)
Calcium: 9.8 mg/dL (ref 8.9–10.3)
Chloride: 99 mmol/L (ref 98–111)
Creatinine, Ser: 1.44 mg/dL — ABNORMAL HIGH (ref 0.61–1.24)
GFR calc Af Amer: >60 mL/min (ref >60)
GFR calc non Af Amer: >60 mL/min (ref >60)
Glucose, Bld: 206 mg/dL — ABNORMAL HIGH (ref 70–99)
Potassium: 3.9 mmol/L (ref 3.5–5.1)
Sodium: 136 mmol/L (ref 135–145)
Total Bilirubin: 1 mg/dL (ref 0.3–1.2)
Total Protein: 6.9 g/dL (ref 6.5–8.1)

## 2018-12-13 LAB — DIGOXIN LEVEL: Digoxin Level: 0.4 ng/mL — ABNORMAL LOW (ref 0.8–2.0)

## 2018-12-13 LAB — TROPONIN I (HIGH SENSITIVITY)
Troponin I (High Sensitivity): 47 ng/L — ABNORMAL HIGH (ref <18)
Troponin I (High Sensitivity): 43 ng/L — ABNORMAL HIGH (ref <18)

## 2018-12-13 LAB — LACTIC ACID, PLASMA
Lactic Acid, Venous: 1.8 mmol/L (ref 0.5–1.9)
Lactic Acid, Venous: 1.4 mmol/L (ref 0.5–1.9)

## 2018-12-13 LAB — BRAIN NATRIURETIC PEPTIDE: B Natriuretic Peptide: 1156.1 pg/mL — ABNORMAL HIGH (ref 0.0–100.0)

## 2018-12-13 LAB — SARS CORONAVIRUS 2 BY RT PCR (HOSPITAL ORDER, PERFORMED IN ~~LOC~~ HOSPITAL LAB): SARS Coronavirus 2: POSITIVE — AB

## 2018-12-13 LAB — D-DIMER, QUANTITATIVE: D-Dimer, Quant: 0.53 ug/mL-FEU — ABNORMAL HIGH (ref 0.00–0.50)

## 2018-12-13 MED ORDER — DEXAMETHASONE SODIUM PHOSPHATE 10 MG/ML IJ SOLN
6.0000 mg | Freq: Once | INTRAMUSCULAR | Status: AC
  Administered 2018-12-13: 6 mg via INTRAVENOUS
  Filled 2018-12-13: qty 1

## 2018-12-13 MED ORDER — FUROSEMIDE 10 MG/ML IJ SOLN
40.0000 mg | Freq: Once | INTRAMUSCULAR | Status: AC
  Administered 2018-12-13: 40 mg via INTRAVENOUS
  Filled 2018-12-13: qty 4

## 2018-12-13 MED ORDER — SODIUM CHLORIDE 0.9 % IV SOLN
2.0000 g | Freq: Once | INTRAVENOUS | Status: AC
  Administered 2018-12-13: 2 g via INTRAVENOUS
  Filled 2018-12-13: qty 20

## 2018-12-13 MED ORDER — SODIUM CHLORIDE 0.9 % IV SOLN
500.0000 mg | Freq: Once | INTRAVENOUS | Status: AC
  Administered 2018-12-13: 500 mg via INTRAVENOUS
  Filled 2018-12-13: qty 500

## 2018-12-13 MED ORDER — IOHEXOL 350 MG/ML SOLN
100.0000 mL | Freq: Once | INTRAVENOUS | Status: AC
  Administered 2018-12-13: 19:00:00 100 mL via INTRAVENOUS

## 2018-12-13 NOTE — ED Notes (Signed)
ED TO INPATIENT HANDOFF REPORT  ED Nurse Name and Phone #: Shelda Pal Drummond Name/Age/Gender Carlos Brewer. 33 y.o. male Room/Bed: MH04/MH04  Code Status   Code Status: Prior  Home/SNF/Other Home Patient oriented to: Is this baseline? Yes   Triage Complete: Triage complete  Chief Complaint covid sx  Triage Note Pt c/o sob x 2 days.  Pt hass tachypnea  In triage.  Pt states he has some coughing  And headache.     Allergies No Known Allergies  Level of Care/Admitting Diagnosis ED Disposition    ED Disposition Condition [facility] [facility]: [place] [[id number]]  Level of Care: Telemetry [5]  Covid Evaluation: Confirmed COVID Positive  Diagnosis: [id number]  Admitting Physician: Phillips Grout [id number]  Attending Physician: Derrill Kay A [[id number]  Estimated length of stay: past midnight tomorrow  Certification:: I certify this patient will need inpatient services for at least 2 midnights  PT Class (Do Not Modify): Inpatient [101]  PT Acc Code (Do Not Modify): Private [1]       B Medical/Surgery History Past Medical History:  Diagnosis Date  . CHF (congestive heart failure) ([place])   . Cocaine abuse ([place])   . HFrEF (heart failure with reduced ejection fraction) ([place])    a. 10/2016 Echo: EF 15-20%, Gr2 DD, mildly dil LA/RA.  Marland Kitchen History of cardiac cath    a. 10/2016 Cath: LM nl, LAD min irregs, LCX nl, RCA nl, EF 15%.  . Morbid obesity ([place])   . Myocarditis ([place])    a. 10/2016 Admit w/ CHF and trop elevation; b. 10/2016 Echo: EF 15-20%; c. 10/2016 Cath: Min irregs in LAD otw nl cors, EF 15%, glob HK.  Marland Kitchen NICM (nonischemic cardiomyopathy) ([place])    a. 10/2016 Echo: EF 15-20%, Gr2 DD; b. 10/2016 Cath: Nl cors.  . Palpitations   . Sleep apnea    USES CPAP  . Tobacco abuse    Past Surgical History:  Procedure Laterality Date  . CORONARY ANGIOPLASTY    . NO PAST SURGERIES    . RIGHT/LEFT HEART CATH AND CORONARY  ANGIOGRAPHY N/A 10/27/2016   Procedure: RIGHT/LEFT HEART CATH AND CORONARY ANGIOGRAPHY;  Surgeon: Wellington Hampshire, MD;  Location: [facility];  Service: Cardiovascular;  Laterality: N/A;  . WISDOM TOOTH EXTRACTION  2008     A IV Location/Drains/Wounds Patient Lines/Drains/Airways Status   Active Line/Drains/Airways    Name:   Placement date:   Placement time:   Site:   Days:   Peripheral IV 12/13/18 Right Antecubital   12/13/18    1750    Antecubital   less than 1          Intake/Output Last 24 hours  Intake/Output Summary (Last 24 hours) at 12/13/2018 2331 Last data filed at 12/13/2018 2307 Gross per 24 hour  Intake 352.62 ml  Output 1500 ml  Net -1147.38 ml    Labs/Imaging Results for orders placed or performed during the hospital encounter of 12/13/18 (from the past 48 hour(s))  Brain natriuretic peptide     Status: Abnormal   Collection Time: 12/13/18  5:50 PM  Result Value Ref Range   B Natriuretic Peptide 1,156.1 (H) 0.0 - 100.0 pg/mL    Comment: Performed at [facility], [address] [place], [place] [address]  CBC with Differential     Status: None   Collection Time: 12/13/18  5:51 PM  Result Value Ref  Range   WBC 5.2 4.0 - 10.5 K/uL   RBC 4.33 4.22 - 5.81 MIL/uL   Hemoglobin 13.3 13.0 - 17.0 g/dL   HCT 40.5 39.0 - 52.0 %   MCV 93.5 80.0 - 100.0 fL   MCH 30.7 26.0 - 34.0 pg   MCHC 32.8 30.0 - 36.0 g/dL   RDW 12.2 11.5 - 15.5 %   Platelets 227 150 - 400 K/uL   nRBC 0.0 0.0 - 0.2 %   Neutrophils Relative % 71 %   Neutro Abs 3.7 1.7 - 7.7 K/uL   Lymphocytes Relative 22 %   Lymphs Abs 1.1 0.7 - 4.0 K/uL   Monocytes Relative 6 %   Monocytes Absolute 0.3 0.1 - 1.0 K/uL   Eosinophils Relative 0 %   Eosinophils Absolute 0.0 0.0 - 0.5 K/uL   Basophils Relative 1 %   Basophils Absolute 0.0 0.0 - 0.1 K/uL   Immature Granulocytes 0 %   Abs Immature Granulocytes 0.01 0.00 - 0.07 K/uL    Comment: Performed at [facility], [address] [place], [place] [address]  Comprehensive metabolic panel     Status: Abnormal   Collection Time: 12/13/18  5:51 PM  Result Value Ref Range   Sodium 136 135 - 145 mmol/L   Potassium 3.9 3.5 - 5.1 mmol/L   Chloride 99 98 - 111 mmol/L   CO2 26 22 - 32 mmol/L   Glucose, Bld 206 (H) 70 - 99 mg/dL   BUN 12 6 - 20 mg/dL   Creatinine, Ser 1.44 (H) 0.61 - 1.24 mg/dL   Calcium 9.8 8.9 - 10.3 mg/dL   Total Protein 6.9 6.5 - 8.1 g/dL   Albumin 3.8 3.5 - 5.0 g/dL   AST 35 15 - 41 U/L   ALT 45 (H) 0 - 44 U/L   Alkaline Phosphatase 68 38 - 126 U/L   Total Bilirubin 1.0 0.3 - 1.2 mg/dL   GFR calc non Af Amer >60 >60 mL/min   GFR calc Af Amer >60 >60 mL/min   Anion gap 11 5 - 15    Comment: Performed at [facility], [address] [place], [place] [address]  D-dimer, quantitative (not at [facility])     Status: Abnormal   Collection Time: 12/13/18  5:51 PM  Result Value Ref Range   D-Dimer, Quant 0.53 (H) 0.00 - 0.50 ug/mL-FEU    Comment: (NOTE) At the manufacturer cut-off of 0.50 ug/mL FEU, this assay has been documented to exclude PE with a sensitivity and negative predictive value of 97 to 99%.  At this time, this assay has not been approved by the FDA to exclude DVT/VTE. Results should be correlated with clinical presentation. Performed at [facility], [address] [place], [place] [address]   Lactic acid, plasma     Status: None   Collection Time: 12/13/18  5:51 PM  Result Value Ref Range   Lactic Acid, Venous 1.8 0.5 - 1.9 mmol/L    Comment: Performed at [facility], [address] [place], [place] [address]  Troponin I (High Sensitivity)     Status: Abnormal   Collection Time: 12/13/18  5:51 PM  Result Value Ref Range   Troponin I (High Sensitivity) 47 (H) <18 ng/L    Comment: (NOTE) Elevated high sensitivity troponin I (hsTnI) values and significant  changes across serial measurements may suggest ACS but many other  chronic and  acute  conditions are known to elevate hsTnI results.  Refer to the "Links" section for chest pain algorithms and additional  guidance. Performed at [facility], [address] [place], [place] [address]   Digoxin level     Status: Abnormal   Collection Time: 12/13/18  5:56 PM  Result Value Ref Range   Digoxin Level 0.4 (L) 0.8 - 2.0 ng/mL    Comment: Performed at [facility], [address] [place], [place] [address]  SARS Coronavirus 2 [facility] order, Performed in [facility] hospital lab) Nasopharyngeal Nasopharyngeal Swab     Status: Abnormal   Collection Time: 12/13/18  7:40 PM   Specimen: Nasopharyngeal Swab  Result Value Ref Range   SARS Coronavirus 2 POSITIVE (A) NEGATIVE    Comment: CRITICAL RESULT CALLED TO, READ BACK BY AND VERIFIED WITH: Marsa Aris RN @2028  12/13/2018 OLSONM (NOTE) If result is NEGATIVE SARS-CoV-2 target nucleic acids are NOT DETECTED. The SARS-CoV-2 RNA is generally detectable in upper and lower  respiratory specimens during the acute phase of infection. The lowest  concentration of SARS-CoV-2 viral copies this assay can detect is 250  copies / mL. A negative result does not preclude SARS-CoV-2 infection  and should not be used as the sole basis for treatment or other  patient management decisions.  A negative result may occur with  improper specimen collection / handling, submission of specimen other  than nasopharyngeal swab, presence of viral mutation(s) within the  areas targeted by this assay, and inadequate number of viral copies  (<250 copies / mL). A negative result must be combined with clinical  observations, patient history, and epidemiological information. If result is POSITIVE SARS-CoV-2 target nucleic acids are  DETECTED. The SARS-CoV-2 RNA is generally detectable in upper and lower  respiratory specimens during the acute phase of infection.  Positive  results are indicative of active infection with  SARS-CoV-2.  Clinical  correlation with patient history and other diagnostic information is  necessary to determine patient infection status.  Positive results do  not rule out bacterial infection or co-infection with other viruses. If result is PRESUMPTIVE POSTIVE SARS-CoV-2 nucleic acids MAY BE PRESENT.   A presumptive positive result was obtained on the submitted specimen  and confirmed on repeat testing.  While 2019 novel coronavirus  (SARS-CoV-2) nucleic acids may be present in the submitted sample  additional confirmatory testing may be necessary for epidemiological  and / or clinical management purposes  to differentiate between  SARS-CoV-2 and other Sarbecovirus currently known to infect humans.  If clinically indicated additional testing with an alternate test  methodology  [phone]) is advised. The SARS-CoV-2 RNA is generally  detectable in upper and lower respiratory specimens during the acute  phase of infection. The expected result is Negative. Fact Sheet for Patients:  StrictlyIdeas.no Fact Sheet for Healthcare Providers: BankingDealers.co.za This test is not yet approved or cleared by the [place] FDA and has been authorized for detection and/or diagnosis of SARS-CoV-2 by FDA under an Emergency Use Authorization (EUA).  This EUA will remain in effect (meaning this test can be used) for the duration of the COVID-19 declaration under Section 564(b)(1) of the Act, 21 U.S.C. section 360bbb-3(b)(1), unless the authorization is terminated or revoked sooner. Performed at [facility], [address] [place], [place] [address]   Lactic acid, plasma     Status: None   Collection Time: 12/13/18  7:50 PM  Result Value Ref Range  Lactic Acid, Venous 1.4 0.5 - 1.9 mmol/L    Comment: Performed at [facility], [address] [place], [place] [address]  Troponin I (High Sensitivity)     Status: Abnormal    Collection Time: 12/13/18  7:50 PM  Result Value Ref Range   Troponin I (High Sensitivity) 43 (H) <18 ng/L    Comment: (NOTE) Elevated high sensitivity troponin I (hsTnI) values and significant  changes across serial measurements may suggest ACS but many other  chronic and acute conditions are known to elevate hsTnI results.  Refer to the "Links" section for chest pain algorithms and additional  guidance. Performed at [facility], [address] [place], [place] [address]   Culture, blood (Routine X 2) w Reflex to ID Panel     Status: None (Preliminary result)   Collection Time: 12/13/18  8:30 PM   Specimen: BLOOD RIGHT HAND  Result Value Ref Range   Specimen Description      BLOOD RIGHT HAND Performed at [facility], [place], [place], [place] [address]    Special Requests      BOTTLES DRAWN AEROBIC ONLY Blood Culture results may not be optimal due to an inadequate volume of blood received in culture bottles Performed at [facility], [address] [place], [place] [address]    Culture PENDING    Report Status PENDING    Ct Angio Chest Pe W Or Wo Contrast  Result Date: 12/13/2018 CLINICAL DATA:  33 year old male with shortness of breath for 2 days. Abnormal D-dimer. EXAM: CT ANGIOGRAPHY CHEST WITH CONTRAST TECHNIQUE: Multidetector CT imaging of the chest was performed using the standard protocol during bolus administration of intravenous contrast. Multiplanar CT image reconstructions and MIPs were obtained to evaluate the vascular anatomy. CONTRAST:  147mL OMNIPAQUE IOHEXOL 350 MG/ML SOLN COMPARISON:  Portable chest the same day. Chest CTA 04/10/2018. FINDINGS: Cardiovascular: Good contrast bolus timing in the pulmonary arterial tree. No focal filling defect identified in the pulmonary arteries to suggest acute pulmonary embolism. Stable cardiomegaly. No pericardial effusion. No contrast in the aorta today. No aortic atherosclerosis identified.  Mediastinum/Nodes: Prominent right paratracheal lymph nodes individually up to 14 millimeters short axis are stable since January but nonspecific. Smaller subcentimeter prevascular nodes are stable. More normal appearing bilateral hilar nodes are also stable. No axillary or thoracic inlet lymphadenopathy. Lungs/Pleura: Major airways are patent. There is centrilobular ground-glass nodularity throughout the right upper lobe (series 5, image 37) with ventilation elsewhere appearing stable to that in January. Mild mosaic attenuation in both lower lobes again noted. Trace layering right pleural effusion. Trace left pleural fluid. Mild linear atelectasis or scarring along the left hilum. Upper Abdomen: Mild reflux of contrast into the hepatic IVC and hepatic veins. Questionable hepatic steatosis. Negative visible spleen, pancreas, adrenal glands, stomach and left renal upper pole. Musculoskeletal: Left anterior 5th rib fracture appears partially healed on series 5, image 61 and was not apparent in June. No superimposed No acute osseous abnormality identified. Review of the MIP images confirms the above findings. IMPRESSION: 1. No evidence of acute pulmonary embolus. 2. Isolated right upper lobe centrilobular ground-glass nodularity is most suspicious for an acute respiratory infection. Differential considerations include hypersensitivity pneumonitis, pulmonary vasculitis. 3. Chronic cardiomegaly with trace layering right and trace left pleural effusions today. Mild reflux of contrast into the hepatic veins raising the possibility of right heart failure. 4. Nonspecific mediastinal lymphadenopathy is stable since January. This might  be reactive in the setting of recurrent CHF (#3). 5. Partially healed left anterior 5th rib fracture. Electronically Signed   By: Genevie Ann M.D.   On: 12/13/2018 19:30   Dg Chest Port 1 View  Result Date: 12/13/2018 CLINICAL DATA:  33 year old male with shortness of breath for 2 days. Abnormal  D-dimer. EXAM: PORTABLE CHEST 1 VIEW COMPARISON:  Chest CTA today reported separately. FINDINGS: Portable AP upright view at 1907 hours. Lower lung volumes compared to radiographs in April. Stable cardiomegaly and mediastinal contours. Visualized tracheal air column is within normal limits. Pulmonary vascularity appears mildly increased since April. Right upper lobe centrilobular nodularity and small pleural effusions were better demonstrated by CT today. IMPRESSION: 1. Mildly lower lung volumes and increased pulmonary vascularity compared to April radiographs. Stable cardiomegaly. 2. See also Chest CTA findings today reported separately. Electronically Signed   By: Genevie Ann M.D.   On: 12/13/2018 19:32    Pending Labs Unresulted Labs (From admission, onward)    Start     Ordered   12/13/18 2008  Culture, blood (Routine X 2) w Reflex to ID Panel  BLOOD CULTURE X 2,   R (with STAT occurrences)     12/13/18 2008          Vitals/Pain Today's Vitals   12/13/18 2036 12/13/18 2203 12/13/18 2230 12/13/18 2319  BP: 100/74 97/70 97/71  96/75  Pulse: (!) 101 (!) 109 (!) 103 (!) 103  Resp: 20  13 (!) 24  Temp:      SpO2: 95% 98% 99% 100%    Isolation Precautions No active isolations  Medications Medications  iohexol (OMNIPAQUE) 350 MG/ML injection 100 mL (100 mLs Intravenous Contrast Given 12/13/18 1902)  furosemide (LASIX) injection 40 mg (40 mg Intravenous Given 12/13/18 1917)  cefTRIAXone (ROCEPHIN) 2 g in sodium chloride 0.9 % 100 mL IVPB (0 g Intravenous Stopped 12/13/18 2112)  azithromycin (ZITHROMAX) 500 mg in sodium chloride 0.9 % 250 mL IVPB (0 mg Intravenous Stopped 12/13/18 2307)  dexamethasone (DECADRON) injection 6 mg (6 mg Intravenous Given 12/13/18 2158)    Mobility walks Low fall risk   Focused Assessments Cardiac Assessment Handoff:    Lab Results  Component Value Date   TROPONINI 0.04 (HH) 07/12/2018   Lab Results  Component Value Date   DDIMER 0.53 (H) 12/13/2018    Does the Patient currently have chest pain? No     R Recommendations: See Admitting Provider Note  Report given to:   Additional Notes:

## 2018-12-13 NOTE — ED Provider Notes (Signed)
[facility] Emergency Department Provider Note MRN:  [id number]  Arrival date & time: 12/13/18     Chief Complaint   Shortness of Breath   History of Present Illness   Carlos Loga. is a 33 y.o. year-old male with a history of CHF presenting to the ED with chief complaint of shortness of breath.  Sudden onset shortness of breath this morning, progressively worsening.  Long car ride to [place] recently.  Denies leg pain or swelling.  Denies chest pain.  No fever or cough, no abdominal pain.  Symptoms are moderate in severity, constant, worse with ambulation.  Review of Systems  A complete 10 system review of systems was obtained and all systems are negative except as noted in the HPI and PMH.   Patient's Health History    Past Medical History:  Diagnosis Date  . CHF (congestive heart failure) ([place])   . Cocaine abuse ([place])   . HFrEF (heart failure with reduced ejection fraction) ([place])    a. 10/2016 Echo: EF 15-20%, Gr2 DD, mildly dil LA/RA.  Marland Kitchen History of cardiac cath    a. 10/2016 Cath: LM nl, LAD min irregs, LCX nl, RCA nl, EF 15%.  . Morbid obesity ([place])   . Myocarditis ([place])    a. 10/2016 Admit w/ CHF and trop elevation; b. 10/2016 Echo: EF 15-20%; c. 10/2016 Cath: Min irregs in LAD otw nl cors, EF 15%, glob HK.  Marland Kitchen NICM (nonischemic cardiomyopathy) ([place])    a. 10/2016 Echo: EF 15-20%, Gr2 DD; b. 10/2016 Cath: Nl cors.  . Palpitations   . Sleep apnea    USES CPAP  . Tobacco abuse     Past Surgical History:  Procedure Laterality Date  . CORONARY ANGIOPLASTY    . NO PAST SURGERIES    . RIGHT/LEFT HEART CATH AND CORONARY ANGIOGRAPHY N/A 10/27/2016   Procedure: RIGHT/LEFT HEART CATH AND CORONARY ANGIOGRAPHY;  Surgeon: Wellington [place], MD;  Location: [facility];  Service: Cardiovascular;  Laterality: N/A;  . WISDOM TOOTH EXTRACTION  2008    Family History  Problem Relation Age of Onset  . Healthy Mother   . Heart failure  Father        EF is 35%    Social History   Socioeconomic History  . Marital status: Married    Spouse name: Not on file  . Number of children: Not on file  . Years of education: Not on file  . Highest education level: Not on file  Occupational History  . Occupation: switches out Electronics engineer: [employer]  . Financial resource strain: Not on file  . Food insecurity    Worry: Not on file    Inability: Not on file  . Transportation needs    Medical: Not on file    Non-medical: Not on file  Tobacco Use  . Smoking status: Current Every Day Smoker    Packs/day: 0.50    Types: Cigarettes  . Smokeless tobacco: Never Used  Substance and Sexual Activity  . Alcohol use: Yes  . Drug use: Not Currently    Types: Cocaine    Comment: quit 10/23/2016   . Sexual activity: Yes    Birth control/protection: None  Lifestyle  . Physical activity    Days per week: Not on file    Minutes per session: Not on file  . Stress: Not on file  Relationships  . Social connections  Talks on phone: Not on file    Gets together: Not on file    Attends religious service: Not on file    Active member of club or organization: Not on file    Attends meetings of clubs or organizations: Not on file    Relationship status: Not on file  . Intimate partner violence    Fear of current or ex partner: Not on file    Emotionally abused: Not on file    Physically abused: Not on file    Forced sexual activity: Not on file  Other Topics Concern  . Not on file  Social History Narrative   ** Merged History Encounter **       ** Merged History Encounter **   Lives in [place] w/ girlfriend.  He does not routinely exercise.     Physical Exam  Vital Signs and Nursing Notes reviewed Vitals:   12/13/18 1921 12/13/18 2036  BP: 104/80 100/74  Pulse: (!) 105 (!) 101  Resp: 15 20  Temp:    SpO2: 100% 95%    CONSTITUTIONAL: Well-appearing, NAD NEURO:  Alert and oriented  x 3, no focal deficits EYES:  eyes equal and reactive ENT/NECK:  no LAD, no JVD CARDIO: Tachycardic rate, well-perfused, normal S1 and S2 PULM:  CTAB no wheezing or rhonchi, mildly tachypneic GI/GU:  normal bowel sounds, non-distended, non-tender MSK/SPINE:  No gross deformities, no edema SKIN:  no rash, atraumatic PSYCH:  Appropriate speech and behavior  Diagnostic and Interventional Summary    EKG Interpretation  Date/Time:  Monday December 13 2018 17:40:39 EDT Ventricular Rate:  104 PR Interval:  194 QRS Duration: 116 QT Interval:  376 QTC Calculation: 494 R Axis:   104 Text Interpretation:  Sinus tachycardia Biatrial enlargement Rightward axis Incomplete left bundle branch block Abnormal ECG Confirmed by Gerlene Fee [phone]) on 12/13/2018 6:42:30 PM      Labs Reviewed  SARS CORONAVIRUS 2 (HOSPITAL ORDER, [facility]) - Abnormal; Notable for the following components:      Result Value   SARS Coronavirus 2 POSITIVE (*)    All other components within normal limits  COMPREHENSIVE METABOLIC PANEL - Abnormal; Notable for the following components:   Glucose, Bld 206 (*)    Creatinine, Ser 1.44 (*)    ALT 45 (*)    All other components within normal limits  D-DIMER, QUANTITATIVE (NOT AT [facility]) - Abnormal; Notable for the following components:   D-Dimer, Quant 0.53 (*)    All other components within normal limits  BRAIN NATRIURETIC PEPTIDE - Abnormal; Notable for the following components:   B Natriuretic Peptide 1,156.1 (*)    All other components within normal limits  DIGOXIN LEVEL - Abnormal; Notable for the following components:   Digoxin Level 0.4 (*)    All other components within normal limits  TROPONIN I (HIGH SENSITIVITY) - Abnormal; Notable for the following components:   Troponin I (High Sensitivity) 47 (*)    All other components within normal limits  TROPONIN I (HIGH SENSITIVITY) - Abnormal; Notable for the following components:    Troponin I (High Sensitivity) 43 (*)    All other components within normal limits  CULTURE, BLOOD (ROUTINE X 2)  CULTURE, BLOOD (ROUTINE X 2)  CBC WITH DIFFERENTIAL/PLATELET  LACTIC ACID, PLASMA  LACTIC ACID, PLASMA    DG Chest Port 1 View  Final Result    CT ANGIO CHEST PE W OR WO CONTRAST  Final Result  Medications  azithromycin (ZITHROMAX) 500 mg in sodium chloride 0.9 % 250 mL IVPB (500 mg Intravenous New Bag/Given 12/13/18 2112)  dexamethasone (DECADRON) injection 6 mg (has no administration in time range)  iohexol (OMNIPAQUE) 350 MG/ML injection 100 mL (100 mLs Intravenous Contrast Given 12/13/18 1902)  furosemide (LASIX) injection 40 mg (40 mg Intravenous Given 12/13/18 1917)  cefTRIAXone (ROCEPHIN) 2 g in sodium chloride 0.9 % 100 mL IVPB (0 g Intravenous Stopped 12/13/18 2112)     Procedures Critical Care Critical Care Documentation Critical care time provided by me (excluding procedures): 32 minutes  Condition necessitating critical care: Respiratory distress and hypoxia in the setting of COVID-19 and heart failure exacerbation  Components of critical care management: reviewing of prior records, laboratory and imaging interpretation, frequent re-examination and reassessment of vital signs, administration of IV Lasix, IV Decadron, discussion with consulting services.    ED Course and Medical Decision Making  I have reviewed the triage vital signs and the nursing notes.  Pertinent labs & imaging results that were available during my care of the patient were reviewed by me and considered in my medical decision making (see below for details).  Considering CHF versus pulmonary realism in this 33 year old male with acute shortness of breath, no significant lower extremity edema.  D-dimer elevated, will obtain CTA chest.  CTA chest is without signs of PE, some edema, no evidence to suggest infection.  Patient's COVID test is positive.  Admitted to [facility] for further  care.  Barth Kirks. [place] Small, [place] [email or web address]  Final Clinical Impressions(s) / ED Diagnoses     ICD-10-CM   1. COVID-19  U07.1    J98.8   2. SOB (shortness of breath)  R06.02 DG Chest [facility] 1 View    DG Chest [facility] 1 View  3. Acute on chronic congestive heart failure, unspecified heart failure type ([place])  I50.9     ED Discharge Orders    None      Discharge Instructions Discussed with and Provided to Patient: Discharge Instructions   None       Maudie Flakes, MD 12/13/18 2117

## 2018-12-13 NOTE — ED Triage Notes (Signed)
Pt c/o sob x 2 days.  Pt hass tachypnea  In triage.  Pt states he has some coughing  And headache.

## 2018-12-14 DIAGNOSIS — U071 COVID-19: Principal | ICD-10-CM | POA: Diagnosis present

## 2018-12-14 DIAGNOSIS — J988 Other specified respiratory disorders: Secondary | ICD-10-CM

## 2018-12-14 LAB — HEMOGLOBIN A1C
Hgb A1c MFr Bld: 9.7 % — ABNORMAL HIGH (ref 4.8–5.6)
Mean Plasma Glucose: 231.69 mg/dL

## 2018-12-14 LAB — RAPID URINE DRUG SCREEN, HOSP PERFORMED
Amphetamines: NOT DETECTED
Barbiturates: NOT DETECTED
Benzodiazepines: NOT DETECTED
Cocaine: POSITIVE — AB
Opiates: NOT DETECTED
Tetrahydrocannabinol: NOT DETECTED

## 2018-12-14 LAB — ABO/RH: ABO/RH(D): B POS

## 2018-12-14 LAB — C-REACTIVE PROTEIN: CRP: 2.6 mg/dL — ABNORMAL HIGH (ref <1.0)

## 2018-12-14 LAB — CBC
HCT: 37.6 % — ABNORMAL LOW (ref 39.0–52.0)
Hemoglobin: 12.8 g/dL — ABNORMAL LOW (ref 13.0–17.0)
MCH: 31.1 pg (ref 26.0–34.0)
MCHC: 34 g/dL (ref 30.0–36.0)
MCV: 91.5 fL (ref 80.0–100.0)
Platelets: 224 10*3/uL (ref 150–400)
RBC: 4.11 MIL/uL — ABNORMAL LOW (ref 4.22–5.81)
RDW: 12.1 % (ref 11.5–15.5)
WBC: 4.2 10*3/uL (ref 4.0–10.5)
nRBC: 0 % (ref 0.0–0.2)

## 2018-12-14 LAB — D-DIMER, QUANTITATIVE: D-Dimer, Quant: 0.36 ug/mL-FEU (ref 0.00–0.50)

## 2018-12-14 LAB — CREATININE, SERUM
Creatinine, Ser: 1.28 mg/dL — ABNORMAL HIGH (ref 0.61–1.24)
GFR calc Af Amer: >60 mL/min (ref >60)
GFR calc non Af Amer: >60 mL/min (ref >60)

## 2018-12-14 LAB — GLUCOSE, CAPILLARY
Glucose-Capillary: 457 mg/dL — ABNORMAL HIGH (ref 70–99)
Glucose-Capillary: 522 mg/dL (ref 70–99)

## 2018-12-14 MED ORDER — ACETAMINOPHEN 325 MG PO TABS: 650.0000 mg | ORAL_TABLET | Freq: Four times a day (QID) | ORAL | Status: DC | PRN

## 2018-12-14 MED ORDER — ASPIRIN EC 81 MG PO TBEC
81.0000 mg | DELAYED_RELEASE_TABLET | Freq: Every day | ORAL | Status: DC
  Administered 2018-12-14 – 2018-12-18 (×5): 81 mg via ORAL
  Filled 2018-12-14 (×5): qty 1

## 2018-12-14 MED ORDER — SODIUM CHLORIDE 0.9 % IV SOLN
200.0000 mg | Freq: Once | INTRAVENOUS | Status: AC
  Administered 2018-12-14: 200 mg via INTRAVENOUS
  Filled 2018-12-14: qty 40

## 2018-12-14 MED ORDER — VITAMIN C 500 MG PO TABS
500.0000 mg | ORAL_TABLET | Freq: Every day | ORAL | Status: DC
  Administered 2018-12-14 – 2018-12-18 (×5): 500 mg via ORAL
  Filled 2018-12-14 (×5): qty 1

## 2018-12-14 MED ORDER — SPIRONOLACTONE 25 MG PO TABS
25.0000 mg | ORAL_TABLET | Freq: Every day | ORAL | Status: DC
  Administered 2018-12-14 – 2018-12-16 (×3): 25 mg via ORAL
  Filled 2018-12-14 (×6): qty 1

## 2018-12-14 MED ORDER — DEXAMETHASONE 6 MG PO TABS
6.0000 mg | ORAL_TABLET | Freq: Every day | ORAL | Status: DC
  Administered 2018-12-14 – 2018-12-18 (×5): 6 mg via ORAL
  Filled 2018-12-14 (×5): qty 1

## 2018-12-14 MED ORDER — ZINC SULFATE 220 (50 ZN) MG PO CAPS
220.0000 mg | ORAL_CAPSULE | Freq: Every day | ORAL | Status: DC
  Administered 2018-12-14 – 2018-12-18 (×5): 220 mg via ORAL
  Filled 2018-12-14 (×5): qty 1

## 2018-12-14 MED ORDER — TORSEMIDE 20 MG PO TABS
40.0000 mg | ORAL_TABLET | Freq: Two times a day (BID) | ORAL | Status: DC
  Administered 2018-12-14 – 2018-12-15 (×2): 40 mg via ORAL
  Filled 2018-12-14 (×4): qty 2

## 2018-12-14 MED ORDER — ONDANSETRON HCL 4 MG/2ML IJ SOLN: 4.0000 mg | Freq: Four times a day (QID) | INTRAMUSCULAR | Status: DC | PRN

## 2018-12-14 MED ORDER — HYDROCORTISONE 1 % EX CREA
TOPICAL_CREAM | Freq: Four times a day (QID) | CUTANEOUS | Status: DC | PRN
  Administered 2018-12-14 – 2018-12-15 (×2): 1 via TOPICAL
  Administered 2018-12-16: 10:00:00 via TOPICAL
  Filled 2018-12-14 (×2): qty 28

## 2018-12-14 MED ORDER — ATORVASTATIN CALCIUM 40 MG PO TABS
40.0000 mg | ORAL_TABLET | Freq: Every day | ORAL | Status: DC
  Administered 2018-12-14 – 2018-12-17 (×4): 40 mg via ORAL
  Filled 2018-12-14 (×5): qty 1

## 2018-12-14 MED ORDER — HYDROCOD POLST-CPM POLST ER 10-8 MG/5ML PO SUER: 5.0000 mL | Freq: Two times a day (BID) | ORAL | Status: DC | PRN

## 2018-12-14 MED ORDER — PANTOPRAZOLE SODIUM 40 MG PO TBEC
40.0000 mg | DELAYED_RELEASE_TABLET | Freq: Every day | ORAL | Status: DC
  Administered 2018-12-14 – 2018-12-18 (×5): 40 mg via ORAL
  Filled 2018-12-14 (×4): qty 1

## 2018-12-14 MED ORDER — ADULT MULTIVITAMIN W/MINERALS CH
1.0000 | ORAL_TABLET | Freq: Every day | ORAL | Status: DC
  Administered 2018-12-14 – 2018-12-18 (×5): 1 via ORAL
  Filled 2018-12-14 (×4): qty 1

## 2018-12-14 MED ORDER — INSULIN ASPART 100 UNIT/ML ~~LOC~~ SOLN
10.0000 [IU] | Freq: Once | SUBCUTANEOUS | Status: AC
  Administered 2018-12-14: 23:00:00 10 [IU] via SUBCUTANEOUS

## 2018-12-14 MED ORDER — SODIUM CHLORIDE 0.9 % IV SOLN
100.0000 mg | INTRAVENOUS | Status: AC
  Administered 2018-12-15 – 2018-12-18 (×4): 100 mg via INTRAVENOUS
  Filled 2018-12-14 (×4): qty 20

## 2018-12-14 MED ORDER — SODIUM CHLORIDE 0.9% FLUSH: 3.0000 mL | INTRAVENOUS | Status: DC | PRN

## 2018-12-14 MED ORDER — INSULIN ASPART 100 UNIT/ML ~~LOC~~ SOLN
0.0000 [IU] | Freq: Every day | SUBCUTANEOUS | Status: DC
  Administered 2018-12-14: 5 [IU] via SUBCUTANEOUS

## 2018-12-14 MED ORDER — ENOXAPARIN SODIUM 60 MG/0.6ML ~~LOC~~ SOLN
60.0000 mg | SUBCUTANEOUS | Status: DC
  Administered 2018-12-14 – 2018-12-18 (×5): 60 mg via SUBCUTANEOUS
  Filled 2018-12-14 (×5): qty 0.6

## 2018-12-14 MED ORDER — SODIUM CHLORIDE 0.9% FLUSH
3.0000 mL | Freq: Two times a day (BID) | INTRAVENOUS | Status: DC
  Administered 2018-12-14 – 2018-12-18 (×8): 3 mL via INTRAVENOUS

## 2018-12-14 MED ORDER — DIGOXIN 125 MCG PO TABS
0.1250 mg | ORAL_TABLET | Freq: Every day | ORAL | Status: DC
  Administered 2018-12-14 – 2018-12-18 (×5): 0.125 mg via ORAL
  Filled 2018-12-14 (×6): qty 1

## 2018-12-14 MED ORDER — SACUBITRIL-VALSARTAN 24-26 MG PO TABS
1.0000 | ORAL_TABLET | Freq: Two times a day (BID) | ORAL | Status: DC
  Administered 2018-12-14 – 2018-12-17 (×5): 1 via ORAL
  Filled 2018-12-14 (×10): qty 1

## 2018-12-14 MED ORDER — GUAIFENESIN-DM 100-10 MG/5ML PO SYRP
10.0000 mL | ORAL_SOLUTION | ORAL | Status: DC | PRN
  Administered 2018-12-16: 10 mL via ORAL
  Filled 2018-12-14: qty 10

## 2018-12-14 MED ORDER — SODIUM CHLORIDE 0.9 % IV SOLN: 250.0000 mL | INTRAVENOUS | Status: DC | PRN

## 2018-12-14 MED ORDER — ONDANSETRON HCL 4 MG PO TABS: 4.0000 mg | ORAL_TABLET | Freq: Four times a day (QID) | ORAL | Status: DC | PRN

## 2018-12-14 MED ORDER — CARVEDILOL 3.125 MG PO TABS
3.1250 mg | ORAL_TABLET | Freq: Two times a day (BID) | ORAL | Status: DC
  Administered 2018-12-14 – 2018-12-18 (×4): 3.125 mg via ORAL
  Filled 2018-12-14 (×6): qty 1

## 2018-12-14 MED ORDER — INSULIN ASPART 100 UNIT/ML ~~LOC~~ SOLN
0.0000 [IU] | Freq: Three times a day (TID) | SUBCUTANEOUS | Status: DC
  Administered 2018-12-15: 5 [IU] via SUBCUTANEOUS

## 2018-12-14 NOTE — Progress Notes (Signed)
RN called about pt needing cpap at night. Unfortunately due to covid, Cpap is not allowed. RN informed pt will need to wear O2 and possibly higher flow in place of cpap tonight. ICU charge RN and [facility] aware

## 2018-12-14 NOTE — Progress Notes (Signed)
Pharmacy Antibiotic Note  Carlos Brewer. is a 33 y.o. male admitted on 12/13/2018 with COVID 19.  Pharmacy has been consulted for Remdesivir dosing.  CT chest shows isolated right upper lobe centrilobular ground-glass nodularity is most suspicious for an acute respiratory infection. Pt requiring supplemental oxygen (2L ) ALT 45  Plan: Remdesivir 200mg  IV now then 100mg  IV daily x 4 days Will f/u ALT and pt's clinical condition   Height: 6' (182.9 cm) Weight: 280 lb 3.3 oz (127.1 kg) IBW/kg (Calculated) : 77.6  Temp (24hrs), Avg:98.2 F (36.8 C), Min:97.4 F (36.3 C), Max:98.9 F (37.2 C)  Recent Labs  Lab 12/13/18 1751 12/13/18 1950  WBC 5.2  --   CREATININE 1.44*  --   LATICACIDVEN 1.8 1.4    Estimated Creatinine Clearance: 99.6 mL/min (A) (by C-G formula based on SCr of 1.44 mg/dL (H)).    No Known Allergies  Antimicrobials this admission: 9/28 Azith/Rocephin x 1 9/29 Remdesivir >> 10/3  Microbiology results: 9/28 BCx:   Thank you for allowing pharmacy to be a part of this patient's care.  Sherlon Handing, PharmD, BCPS CGV Clinical pharmacist phone [phone] 12/14/2018 6:25 AM

## 2018-12-14 NOTE — H&P (Signed)
History and Physical    Carlos Brewer. LX:[id number] DOB: 1986-03-13 DOA: 12/13/2018  PCP: Patient, No Pcp Per  Patient coming from: home  Chief Complaint:  sob  HPI: Carlos Tally. is a 33 y.o. male with medical history significant of NICM, chronic systolic chf, osa comes in with several days of sob, cough and fever and traveling in from [place] by car tonight.  cta neg for pe but showed bilateral infiltrates covid positive.  Denies chest pain.  No le edema or swelling.  Hypoxic on arrival normalized on 2l iters.  Pt referred for admission for covid infection, given decadron already.   Review of Systems: As per HPI otherwise 10 point review of systems negative.   Past Medical History:  Diagnosis Date   CHF (congestive heart failure) (HCC)    Cocaine abuse (HCC)    HFrEF (heart failure with reduced ejection fraction) ([place])    a. 10/2016 Echo: EF 15-20%, Gr2 DD, mildly dil LA/RA.   History of cardiac cath    a. 10/2016 Cath: LM nl, LAD min irregs, LCX nl, RCA nl, EF 15%.   Morbid obesity ([place])    Myocarditis ([place])    a. 10/2016 Admit w/ CHF and trop elevation; b. 10/2016 Echo: EF 15-20%; c. 10/2016 Cath: Min irregs in LAD otw nl cors, EF 15%, glob HK.   NICM (nonischemic cardiomyopathy) ([place])    a. 10/2016 Echo: EF 15-20%, Gr2 DD; b. 10/2016 Cath: Nl cors.   Palpitations    Sleep apnea    USES CPAP   Tobacco abuse     Past Surgical History:  Procedure Laterality Date   CORONARY ANGIOPLASTY     NO PAST SURGERIES     RIGHT/LEFT HEART CATH AND CORONARY ANGIOGRAPHY N/A 10/27/2016   Procedure: RIGHT/LEFT HEART CATH AND CORONARY ANGIOGRAPHY;  Surgeon: Wellington Hampshire, MD;  Location: [facility];  Service: Cardiovascular;  Laterality: N/A;   [place] EXTRACTION  2008     reports that he has been smoking cigarettes. He has been smoking about 0.50 packs per day. He has never used smokeless tobacco. He reports current alcohol use. He reports  previous drug use. Drug: Cocaine.  No Known Allergies  Family History  Problem Relation Age of Onset   Healthy Mother    Heart failure Father        EF is 35%    Prior to Admission medications   Medication Sig Start Date End Date Taking? Authorizing Provider  acetaminophen (TYLENOL) 500 MG tablet Take 2 tablets (1,000 mg total) by mouth every 6 (six) hours as needed. Patient not taking: Reported on 08/23/2018 10/12/16   Olen Cordial, NP  albuterol (VENTOLIN HFA) 108 (90 Base) MCG/ACT inhaler Inhale 1-2 puffs into the lungs every 6 (six) hours as needed for wheezing or shortness of breath. 07/17/18   Florencia Reasons, MD  aspirin EC 81 MG EC tablet Take 1 tablet (81 mg total) by mouth daily. 10/30/16   Demetrios Loll, MD  atorvastatin (LIPITOR) 40 MG tablet Take 1 tablet (40 mg total) by mouth daily at 6 PM. 08/23/18   Bensimhon, Shaune Pascal, MD  bacitracin ointment Apply 1 application topically 2 (two) times daily. 08/28/18   Montine Circle, PA-C  carvedilol (COREG) 3.125 MG tablet Take 1 tablet (3.125 mg total) by mouth 2 (two) times daily with a meal. 08/23/18   Bensimhon, Shaune Pascal, MD  digoxin (LANOXIN) 0.125 MG tablet Take 1 tablet (0.125 mg total) by  mouth daily. 08/23/18   Bensimhon, Shaune Pascal, MD  famotidine (PEPCID) 20 MG tablet Take 1 tablet (20 mg total) by mouth 2 (two) times daily. 02/04/18   Earleen Newport, MD  HYDROcodone-acetaminophen (NORCO/VICODIN) 5-325 MG tablet Take 1-2 tablets by mouth every 6 (six) hours as needed. 08/28/18   Montine Circle, PA-C  metolazone (ZAROXOLYN) 2.5 MG tablet Take 1 tablet (2.5 mg total) by mouth daily as needed for up to 30 days. Take metolazone 2.5mg  x1 only for 5pound weight gain, please take potassium 38meq x1 with metolazone. Patient not taking: Reported on 07/23/2018 07/17/18 08/16/18  Florencia Reasons, MD  potassium chloride SA (K-DUR) 20 MEQ tablet Take 1 tablet (20 mEq total) by mouth daily. Can take extra one if taking metolazone. 08/23/18   Bensimhon, Shaune Pascal,  MD  sacubitril-valsartan (ENTRESTO) 24-26 MG Take 1 tablet by mouth 2 (two) times daily. 08/23/18   Bensimhon, Shaune Pascal, MD  sertraline (ZOLOFT) 100 MG tablet Take 100 mg by mouth daily.  11/12/16   [provider]  spironolactone (ALDACTONE) 25 MG tablet Take 1 tablet (25 mg total) by mouth daily. 08/23/18   Bensimhon, Shaune Pascal, MD  torsemide (DEMADEX) 20 MG tablet Take 2 tablets (40 mg total) by mouth 2 (two) times daily. 08/23/18   Bensimhon, Shaune Pascal, MD  traZODone (DESYREL) 50 MG tablet Take 1 tablet (50 mg total) by mouth at bedtime. 10/29/16   Demetrios Loll, MD    Physical Exam: Vitals:   12/14/18 0200 12/14/18 0230 12/14/18 0330 12/14/18 0430  BP: 109/84 101/72 91/63 95/73   Pulse: 91 91  71  Resp: (!) 33 (!) 26 (!) 33 (!) 25  Temp:    98.9 F (37.2 C)  TempSrc:    Oral  SpO2: 99% 94%  94%      Constitutional: NAD, calm, comfortable Vitals:   12/14/18 0200 12/14/18 0230 12/14/18 0330 12/14/18 0430  BP: 109/84 101/72 91/63 95/73   Pulse: 91 91  71  Resp: (!) 33 (!) 26 (!) 33 (!) 25  Temp:    98.9 F (37.2 C)  TempSrc:    Oral  SpO2: 99% 94%  94%   Eyes: PERRL, lids and conjunctivae normal ENMT: Mucous membranes are moist. Posterior pharynx clear of any exudate or lesions.Normal dentition.  Neck: normal, supple, no masses, no thyromegaly Respiratory: clear to auscultation bilaterally, no wheezing, no crackles. Normal respiratory effort. No accessory muscle use.  Cardiovascular: Regular rate and rhythm, no murmurs / rubs / gallops. No extremity edema. 2+ pedal pulses. No carotid bruits.  Abdomen: no tenderness, no masses palpated. No hepatosplenomegaly. Bowel sounds positive.  Musculoskeletal: no clubbing / cyanosis. No joint deformity upper and lower extremities. Good ROM, no contractures. Normal muscle tone.  Skin: no rashes, lesions, ulcers. No induration Neurologic: CN 2-12 grossly intact. Sensation intact, DTR normal. Strength 5/5 in all 4.  Psychiatric: Normal  judgment and insight. Alert and oriented x 3. Normal mood.    Labs on Admission: I have personally reviewed following labs and imaging studies  CBC: Recent Labs  Lab 12/13/18 1751  WBC 5.2  NEUTROABS 3.7  HGB 13.3  HCT 40.5  MCV 93.5  PLT [id number]   Basic Metabolic Panel: Recent Labs  Lab 12/13/18 1751  NA 136  K 3.9  CL 99  CO2 26  GLUCOSE 206*  BUN 12  CREATININE 1.44*  CALCIUM 9.8   GFR: CrCl cannot be calculated (Unknown ideal weight.). Liver Function Tests: Recent Labs  Lab 12/13/18 1751  AST 35  ALT 45*  ALKPHOS 68  BILITOT 1.0  PROT 6.9  ALBUMIN 3.8   No results for input(s): LIPASE, AMYLASE in the last 168 hours. No results for input(s): AMMONIA in the last 168 hours. Coagulation Profile: No results for input(s): INR, PROTIME in the last 168 hours. Cardiac Enzymes: No results for input(s): CKTOTAL, CKMB, CKMBINDEX, TROPONINI in the last 168 hours. BNP (last 3 results) No results for input(s): PROBNP in the last 8760 hours. HbA1C: No results for input(s): HGBA1C in the last 72 hours. CBG: No results for input(s): GLUCAP in the last 168 hours. Lipid Profile: No results for input(s): CHOL, HDL, LDLCALC, TRIG, CHOLHDL, LDLDIRECT in the last 72 hours. Thyroid Function Tests: No results for input(s): TSH, T4TOTAL, FREET4, T3FREE, THYROIDAB in the last 72 hours. Anemia Panel: No results for input(s): VITAMINB12, FOLATE, FERRITIN, TIBC, IRON, RETICCTPCT in the last 72 hours. Urine analysis:    Component Value Date/Time   COLORURINE YELLOW 08/28/2018 0045   APPEARANCEUR CLEAR 08/28/2018 0045   LABSPEC 1.015 08/28/2018 0045   PHURINE 6.0 08/28/2018 0045   GLUCOSEU >=500 (A) 08/28/2018 0045   HGBUR SMALL (A) 08/28/2018 0045   BILIRUBINUR NEGATIVE 08/28/2018 0045   KETONESUR NEGATIVE 08/28/2018 0045   PROTEINUR 30 (A) 08/28/2018 0045   NITRITE NEGATIVE 08/28/2018 0045   LEUKOCYTESUR NEGATIVE 08/28/2018 0045   Sepsis Labs:  !!!!!!!!!!!!!!!!!!!!!!!!!!!!!!!!!!!!!!!!!!!! @LABRCNTIP (procalcitonin:4,lacticidven:4) ) Recent Results (from the past 240 hour(s))  SARS Coronavirus 2 [facility] order, Performed in [facility] hospital lab) Nasopharyngeal Nasopharyngeal Swab     Status: Abnormal   Collection Time: 12/13/18  7:40 PM   Specimen: Nasopharyngeal Swab  Result Value Ref Range Status   SARS Coronavirus 2 POSITIVE (A) NEGATIVE Final    Comment: CRITICAL RESULT CALLED TO, READ BACK BY AND VERIFIED WITH: KELLIE NEAL RN @2028  12/13/2018 OLSONM (NOTE) If result is NEGATIVE SARS-CoV-2 target nucleic acids are NOT DETECTED. The SARS-CoV-2 RNA is generally detectable in upper and lower  respiratory specimens during the acute phase of infection. The lowest  concentration of SARS-CoV-2 viral copies this assay can detect is 250  copies / mL. A negative result does not preclude SARS-CoV-2 infection  and should not be used as the sole basis for treatment or other  patient management decisions.  A negative result may occur with  improper specimen collection / handling, submission of specimen other  than nasopharyngeal swab, presence of viral mutation(s) within the  areas targeted by this assay, and inadequate number of viral copies  (<250 copies / mL). A negative result must be combined with clinical  observations, patient history, and epidemiological information. If result is POSITIVE SARS-CoV-2 target nucleic acids are  DETECTED. The SARS-CoV-2 RNA is generally detectable in upper and lower  respiratory specimens during the acute phase of infection.  Positive  results are indicative of active infection with SARS-CoV-2.  Clinical  correlation with patient history and other diagnostic information is  necessary to determine patient infection status.  Positive results do  not rule out bacterial infection or co-infection with other viruses. If result is PRESUMPTIVE POSTIVE SARS-CoV-2 nucleic acids MAY BE PRESENT.   A  presumptive positive result was obtained on the submitted specimen  and confirmed on repeat testing.  While 2019 novel coronavirus  (SARS-CoV-2) nucleic acids may be present in the submitted sample  additional confirmatory testing may be necessary for epidemiological  and / or clinical management purposes  to differentiate between  SARS-CoV-2 and other Sarbecovirus currently known to infect  humans.  If clinically indicated additional testing with an alternate test  methodology  [phone]) is advised. The SARS-CoV-2 RNA is generally  detectable in upper and lower respiratory specimens during the acute  phase of infection. The expected result is Negative. Fact Sheet for Patients:  StrictlyIdeas.no Fact Sheet for Healthcare Providers: BankingDealers.co.za This test is not yet approved or cleared by the [place] FDA and has been authorized for detection and/or diagnosis of SARS-CoV-2 by FDA under an Emergency Use Authorization (EUA).  This EUA will remain in effect (meaning this test can be used) for the duration of the COVID-19 declaration under Section 564(b)(1) of the Act, 21 U.S.C. section 360bbb-3(b)(1), unless the authorization is terminated or revoked sooner. Performed at [facility], [address] [place], [place] [address]   Culture, blood (Routine X 2) w Reflex to ID Panel     Status: None (Preliminary result)   Collection Time: 12/13/18  8:30 PM   Specimen: BLOOD RIGHT HAND  Result Value Ref Range Status   Specimen Description   Final    BLOOD RIGHT HAND Performed at [facility], [place] [address] [place], [place] [address]    Special Requests   Final    BOTTLES DRAWN AEROBIC ONLY Blood Culture results may not be optimal due to an inadequate volume of blood received in culture bottles Performed at [facility], [address] [place], [place] [address]    Culture PENDING  Incomplete   Report  Status PENDING  Incomplete     Radiological Exams on Admission: Ct Angio Chest Pe W Or Wo Contrast  Result Date: 12/13/2018 CLINICAL DATA:  33 year old male with shortness of breath for 2 days. Abnormal D-dimer. EXAM: CT ANGIOGRAPHY CHEST WITH CONTRAST TECHNIQUE: Multidetector CT imaging of the chest was performed using the standard protocol during bolus administration of intravenous contrast. Multiplanar CT image reconstructions and MIPs were obtained to evaluate the vascular anatomy. CONTRAST:  16mL OMNIPAQUE IOHEXOL 350 MG/ML SOLN COMPARISON:  Portable chest the same day. Chest CTA 04/10/2018. FINDINGS: Cardiovascular: Good contrast bolus timing in the pulmonary arterial tree. No focal filling defect identified in the pulmonary arteries to suggest acute pulmonary embolism. Stable cardiomegaly. No pericardial effusion. No contrast in the aorta today. No aortic atherosclerosis identified. Mediastinum/Nodes: Prominent right paratracheal lymph nodes individually up to 14 millimeters short axis are stable since January but nonspecific. Smaller subcentimeter prevascular nodes are stable. More normal appearing bilateral hilar nodes are also stable. No axillary or thoracic inlet lymphadenopathy. Lungs/Pleura: Major airways are patent. There is centrilobular ground-glass nodularity throughout the right upper lobe (series 5, image 37) with ventilation elsewhere appearing stable to that in January. Mild mosaic attenuation in both lower lobes again noted. Trace layering right pleural effusion. Trace left pleural fluid. Mild linear atelectasis or scarring along the left hilum. Upper Abdomen: Mild reflux of contrast into the hepatic IVC and hepatic veins. Questionable hepatic steatosis. Negative visible spleen, pancreas, adrenal glands, stomach and left renal upper pole. Musculoskeletal: Left anterior 5th rib fracture appears partially healed on series 5, image 61 and was not apparent in June. No superimposed No acute  osseous abnormality identified. Review of the MIP images confirms the above findings. IMPRESSION: 1. No evidence of acute pulmonary embolus. 2. Isolated right upper lobe centrilobular ground-glass nodularity is most suspicious for an acute respiratory infection. Differential considerations include hypersensitivity pneumonitis, pulmonary vasculitis. 3. Chronic cardiomegaly with trace layering right and trace left pleural effusions today. Mild  reflux of contrast into the hepatic veins raising the possibility of right heart failure. 4. Nonspecific mediastinal lymphadenopathy is stable since January. This might be reactive in the setting of recurrent CHF (#3). 5. Partially healed left anterior 5th rib fracture. Electronically Signed   By: Genevie Ann M.D.   On: 12/13/2018 19:30   Dg Chest Port 1 View  Result Date: 12/13/2018 CLINICAL DATA:  33 year old male with shortness of breath for 2 days. Abnormal D-dimer. EXAM: PORTABLE CHEST 1 VIEW COMPARISON:  Chest CTA today reported separately. FINDINGS: Portable AP upright view at 1907 hours. Lower lung volumes compared to radiographs in April. Stable cardiomegaly and mediastinal contours. Visualized tracheal air column is within normal limits. Pulmonary vascularity appears mildly increased since April. Right upper lobe centrilobular nodularity and small pleural effusions were better demonstrated by CT today. IMPRESSION: 1. Mildly lower lung volumes and increased pulmonary vascularity compared to April radiographs. Stable cardiomegaly. 2. See also Chest CTA findings today reported separately. Electronically Signed   By: Genevie Ann M.D.   On: 12/13/2018 19:32    Old chart reviewed Case discussed with edp  Assessment/Plan 33 yo male with acute hypoxia due to covid pna  Principal Problem:   COVID-19 pna - lovenox, decadron, pt agrees to remdisivir. Vit c/zinc.  Aspirin.  Supplemental oxygen and wean as tolerates.  Active Problems:   Chronic systolic heart failure  ([place])- stable at this time.  Clarify home meds    Nonischemic cardiomyopathy ([place])- noted, stable   CKD (chronic kidney disease), stage II- at baseline    med rec pending pharm review   DVT prophylaxis:  lovenox Code Status:  full Family Communication: none Disposition Plan:  days Consults called:  none Admission status:  admission   Kolbi Tofte A MD [facility]  If 7PM-7AM, please contact night-coverage [email or web address] Password [facility]  12/14/2018, 6:15 AM

## 2018-12-14 NOTE — Progress Notes (Signed)
Day RN reports pt is "possibly new onset" diabetes.  On assessment, pt reports his vision is blurred and he believes it is his blood sugar.  Pt states the last time he went to the [place] his blood sugar was over 800 and that the [place] put him on Levimir and "small pen that was short acting".  CBG checked = 457  No orders to cover DM on the chart.  MD notified.

## 2018-12-14 NOTE — Progress Notes (Signed)
PROGRESS NOTE                                                                                                                                                                                                             Patient Demographics:    Carlos Brewer, is a 33 y.o. male, DOB - 01/10/1986, [id number]  Admit date - 12/13/2018   Admitting Physician Phillips Grout, MD  Outpatient Primary MD for the patient is Patient, No Pcp Per  LOS - 1   Chief Complaint  Patient presents with  . Shortness of Breath       Brief Narrative   This is a no charge note as patient was seen and admitted earlier today.  Carlos Sparaco. is a 33 y.o. male with medical history significant of NICM, chronic systolic elevated CR osa comes in with several days of sob, cough and fever and traveling in from [place] by car tonight.  cta neg for pe but showed bilateral infiltrates covid positive.  Denies chest pain.  No le edema or swelling.  Hypoxic on arrival normalized on 2l [place]. Patient transferred to [facility] for further management.   Subjective:    Carlos Brewer today still reports some dyspnea, denies any chest pain, fever or chills.    Assessment  & Plan :    Principal Problem:   COVID-19 Active Problems:   Chronic systolic heart failure (HCC)   Nonischemic cardiomyopathy (HCC)   CKD (chronic kidney disease), stage II   COVID-19 virus infection   COVID-19 for pneumonia -CT chest with evidence of opacity, there is no hypoxia today, but given his significant history of systolic CHF, with elevated CRP, and high risk for developing respiratory failure from COVID-19 now continue with IV steroids and Remdesivir. -Was encouraged use incentive spirometry  COVID-19 Labs  Recent Labs    12/13/18 1751 12/14/18 0715  DDIMER 0.53* 0.36  CRP  --  2.6*    Lab Results  Component Value Date   SARSCOV2NAA POSITIVE (A) 12/13/2018   SARSCOV2NAA NEGATIVE  [id number]   Chronic systolic CHF -Most recent echo and records April of this year showing low EF 10-15% -Nonischemic cardiomyopathy. LHC in 2018 with no significant coronary disease.  -Cause of cardiomyopathy thought to be heavy ETOH + cocaine.  -Continue home regimen including beta-blockers, Entresto, Aldactone and  Demadex  CKDII Cr at baseline, renal dosing meds  Obesity/OSA on cpap qhs  Polysubstance abuse: + cocaine    Code Status : Full  Family Communication  : D/W patient  Disposition Plan  : Home  Consults  :  None  Procedures  : None  DVT Prophylaxis  :  Lilly lovenox  Lab Results  Component Value Date   PLT 224 12/14/2018    Antibiotics  :    Anti-infectives (From admission, onward)   Start     Dose/Rate Route Frequency Ordered Stop   12/15/18 1000  remdesivir 100 mg in sodium chloride 0.9 % 250 mL IVPB     100 mg 500 mL/hr over 30 Minutes Intravenous Every 24 hours 12/14/18 0631 12/19/18 0959   12/14/18 0800  remdesivir 200 mg in sodium chloride 0.9 % 250 mL IVPB     200 mg 500 mL/hr over 30 Minutes Intravenous Once 12/14/18 0631 12/14/18 0939   12/13/18 2015  cefTRIAXone (ROCEPHIN) 2 g in sodium chloride 0.9 % 100 mL IVPB     2 g 200 mL/hr over 30 Minutes Intravenous  Once 12/13/18 2008 12/13/18 2112   12/13/18 2015  azithromycin (ZITHROMAX) 500 mg in sodium chloride 0.9 % 250 mL IVPB     500 mg 250 mL/hr over 60 Minutes Intravenous  Once 12/13/18 2008 12/13/18 2307        Objective:   Vitals:   12/14/18 0430 12/14/18 0608 12/14/18 0845 12/14/18 0915  BP: 95/73 115/83 128/80   Pulse: 71     Resp: (!) 25 11 (!) 27 18  Temp: 98.9 F (37.2 C) (!) 97.4 F (36.3 C) 98.2 F (36.8 C)   TempSrc: Oral Oral Oral   SpO2: 94% 99% 97%   Weight:  127.1 kg    Height:  6' (1.829 m)      Wt Readings from Last 3 Encounters:  12/14/18 127.1 kg  08/27/18 122.5 kg  07/17/18 125.3 kg     Intake/Output Summary (Last 24 hours) at 12/14/2018 1450  Last data filed at 12/14/2018 0013 Gross per 24 hour  Intake 832.62 ml  Output 2200 ml  Net -1367.38 ml     Physical Exam  Awake Alert, Oriented X 3, No new F.N deficits, Normal affect Symmetrical Chest wall movement, Good air movement bilaterally, CTAB RRR,No Gallops,Rubs or new Murmurs, No Parasternal Heave +ve B.Sounds, Abd Soft, No tenderness,No rebound - guarding or rigidity. No Cyanosis, Clubbing or edema, No new Rash or bruise      Data Review:    CBC Recent Labs  Lab 12/13/18 1751 12/14/18 0715  WBC 5.2 4.2  HGB 13.3 12.8*  HCT 40.5 37.6*  PLT 227 224  MCV 93.5 91.5  MCH 30.7 31.1  MCHC 32.8 34.0  RDW 12.2 12.1  LYMPHSABS 1.1  --   MONOABS 0.3  --   EOSABS 0.0  --   BASOSABS 0.0  --     Chemistries  Recent Labs  Lab 12/13/18 1751 12/14/18 0715  NA 136  --   K 3.9  --   CL 99  --   CO2 26  --   GLUCOSE 206*  --   BUN 12  --   CREATININE 1.44* 1.28*  CALCIUM 9.8  --   AST 35  --   ALT 45*  --   ALKPHOS 68  --   BILITOT 1.0  --    ------------------------------------------------------------------------------------------------------------------ No results for input(s): CHOL, HDL, LDLCALC, TRIG, CHOLHDL, LDLDIRECT  in the last 72 hours.  No results found for: HGBA1C ------------------------------------------------------------------------------------------------------------------ No results for input(s): TSH, T4TOTAL, T3FREE, THYROIDAB in the last 72 hours.  Invalid input(s): FREET3 ------------------------------------------------------------------------------------------------------------------ No results for input(s): VITAMINB12, FOLATE, FERRITIN, TIBC, IRON, RETICCTPCT in the last 72 hours.  Coagulation profile No results for input(s): INR, PROTIME in the last 168 hours.  Recent Labs    12/13/18 1751 12/14/18 0715  DDIMER 0.53* 0.36    Cardiac Enzymes No results for input(s): CKMB, TROPONINI, MYOGLOBIN in the last 168 hours.   Invalid input(s): CK ------------------------------------------------------------------------------------------------------------------    Component Value Date/Time   BNP 1,156.1 (H) 12/13/2018 1750    Inpatient Medications  Scheduled Meds: . aspirin EC  81 mg Oral Daily  . atorvastatin  40 mg Oral q1800  . carvedilol  3.125 mg Oral BID WC  . dexamethasone  6 mg Oral Q0600  . digoxin  0.125 mg Oral Daily  . enoxaparin (LOVENOX) injection  60 mg Subcutaneous Q24H  . multivitamin with minerals  1 tablet Oral Daily  . pantoprazole  40 mg Oral Daily  . sacubitril-valsartan  1 tablet Oral BID  . spironolactone  25 mg Oral Daily  . torsemide  40 mg Oral BID  . vitamin C  500 mg Oral Daily  . zinc sulfate  220 mg Oral Daily   Continuous Infusions: . sodium chloride    . [START ON 12/15/2018] remdesivir 100 mg in NS 250 mL     PRN Meds:.sodium chloride, acetaminophen, chlorpheniramine-HYDROcodone, guaiFENesin-dextromethorphan, ondansetron **OR** ondansetron (ZOFRAN) IV, sodium chloride flush  Micro Results Recent Results (from the past 240 hour(s))  SARS Coronavirus 2 [facility] order, Performed in [facility] hospital lab) Nasopharyngeal Nasopharyngeal Swab     Status: Abnormal   Collection Time: 12/13/18  7:40 PM   Specimen: Nasopharyngeal Swab  Result Value Ref Range Status   SARS Coronavirus 2 POSITIVE (A) NEGATIVE Final    Comment: CRITICAL RESULT CALLED TO, READ BACK BY AND VERIFIED WITH: KELLIE NEAL RN @2028  12/13/2018 OLSONM (NOTE) If result is NEGATIVE SARS-CoV-2 target nucleic acids are NOT DETECTED. The SARS-CoV-2 RNA is generally detectable in upper and lower  respiratory specimens during the acute phase of infection. The lowest  concentration of SARS-CoV-2 viral copies this assay can detect is 250  copies / mL. A negative result does not preclude SARS-CoV-2 infection  and should not be used as the sole basis for treatment or other  patient management decisions.  A  negative result may occur with  improper specimen collection / handling, submission of specimen other  than nasopharyngeal swab, presence of viral mutation(s) within the  areas targeted by this assay, and inadequate number of viral copies  (<250 copies / mL). A negative result must be combined with clinical  observations, patient history, and epidemiological information. If result is POSITIVE SARS-CoV-2 target nucleic acids are  DETECTED. The SARS-CoV-2 RNA is generally detectable in upper and lower  respiratory specimens during the acute phase of infection.  Positive  results are indicative of active infection with SARS-CoV-2.  Clinical  correlation with patient history and other diagnostic information is  necessary to determine patient infection status.  Positive results do  not rule out bacterial infection or co-infection with other viruses. If result is PRESUMPTIVE POSTIVE SARS-CoV-2 nucleic acids MAY BE PRESENT.   A presumptive positive result was obtained on the submitted specimen  and confirmed on repeat testing.  While 2019 novel coronavirus  (SARS-CoV-2) nucleic acids may be present in the submitted  sample  additional confirmatory testing may be necessary for epidemiological  and / or clinical management purposes  to differentiate between  SARS-CoV-2 and other Sarbecovirus currently known to infect humans.  If clinically indicated additional testing with an alternate test  methodology  [phone]) is advised. The SARS-CoV-2 RNA is generally  detectable in upper and lower respiratory specimens during the acute  phase of infection. The expected result is Negative. Fact Sheet for Patients:  StrictlyIdeas.no Fact Sheet for Healthcare Providers: BankingDealers.co.za This test is not yet approved or cleared by the [place] FDA and has been authorized for detection and/or diagnosis of SARS-CoV-2 by FDA under an Emergency Use  Authorization (EUA).  This EUA will remain in effect (meaning this test can be used) for the duration of the COVID-19 declaration under Section 564(b)(1) of the Act, 21 U.S.C. section 360bbb-3(b)(1), unless the authorization is terminated or revoked sooner. Performed at [facility], [address] [place], [place] [address]   Culture, blood (Routine X 2) w Reflex to ID Panel     Status: None (Preliminary result)   Collection Time: 12/13/18  8:30 PM   Specimen: BLOOD RIGHT HAND  Result Value Ref Range Status   Specimen Description   Final    BLOOD RIGHT HAND Performed at [facility], [place] [address], [place], [place] [address]    Special Requests   Final    BOTTLES DRAWN AEROBIC ONLY Blood Culture results may not be optimal due to an inadequate volume of blood received in culture bottles Performed at [facility], [address] [place], [place] [address]    Culture   Final    NO GROWTH < 12 HOURS Performed at [facility], [place] [address] [place], [place] [address]    Report Status PENDING  Incomplete  Culture, blood (Routine X 2) w Reflex to ID Panel     Status: None (Preliminary result)   Collection Time: 12/13/18  8:32 PM   Specimen: BLOOD  Result Value Ref Range Status   Specimen Description   Final    BLOOD RIGHT ANTECUBITAL Performed at [facility], [address] [place], [place] [address]    Special Requests   Final    BOTTLES DRAWN AEROBIC AND ANAEROBIC Blood Culture adequate volume Performed at [facility], [address] [place], [place] [address]    Culture   Final    NO GROWTH < 12 HOURS Performed at [facility], [place] [address], [place], [place] [address]    Report Status PENDING  Incomplete    Radiology Reports Ct Angio Chest Pe W Or Wo Contrast  Result Date: 12/13/2018 CLINICAL DATA:  33 year old male with shortness of breath for 2 days. Abnormal D-dimer. EXAM: CT ANGIOGRAPHY CHEST WITH  CONTRAST TECHNIQUE: Multidetector CT imaging of the chest was performed using the standard protocol during bolus administration of intravenous contrast. Multiplanar CT image reconstructions and MIPs were obtained to evaluate the vascular anatomy. CONTRAST:  125mL OMNIPAQUE IOHEXOL 350 MG/ML SOLN COMPARISON:  Portable chest the same day. Chest CTA 04/10/2018. FINDINGS: Cardiovascular: Good contrast bolus timing in the pulmonary arterial tree. No focal filling defect identified in the pulmonary arteries to suggest acute pulmonary embolism. Stable cardiomegaly. No pericardial effusion. No contrast in the aorta today. No aortic atherosclerosis identified. Mediastinum/Nodes: Prominent right paratracheal lymph nodes individually up to 14 millimeters short axis are stable since January but nonspecific. Smaller subcentimeter  prevascular nodes are stable. More normal appearing bilateral hilar nodes are also stable. No axillary or thoracic inlet lymphadenopathy. Lungs/Pleura: Major airways are patent. There is centrilobular ground-glass nodularity throughout the right upper lobe (series 5, image 37) with ventilation elsewhere appearing stable to that in January. Mild mosaic attenuation in both lower lobes again noted. Trace layering right pleural effusion. Trace left pleural fluid. Mild linear atelectasis or scarring along the left hilum. Upper Abdomen: Mild reflux of contrast into the hepatic IVC and hepatic veins. Questionable hepatic steatosis. Negative visible spleen, pancreas, adrenal glands, stomach and left renal upper pole. Musculoskeletal: Left anterior 5th rib fracture appears partially healed on series 5, image 61 and was not apparent in June. No superimposed No acute osseous abnormality identified. Review of the MIP images confirms the above findings. IMPRESSION: 1. No evidence of acute pulmonary embolus. 2. Isolated right upper lobe centrilobular ground-glass nodularity is most suspicious for an acute respiratory  infection. Differential considerations include hypersensitivity pneumonitis, pulmonary vasculitis. 3. Chronic cardiomegaly with trace layering right and trace left pleural effusions today. Mild reflux of contrast into the hepatic veins raising the possibility of right heart failure. 4. Nonspecific mediastinal lymphadenopathy is stable since January. This might be reactive in the setting of recurrent CHF (#3). 5. Partially healed left anterior 5th rib fracture. Electronically Signed   By: Genevie Ann M.D.   On: 12/13/2018 19:30   Dg Chest Port 1 View  Result Date: 12/13/2018 CLINICAL DATA:  33 year old male with shortness of breath for 2 days. Abnormal D-dimer. EXAM: PORTABLE CHEST 1 VIEW COMPARISON:  Chest CTA today reported separately. FINDINGS: Portable AP upright view at 1907 hours. Lower lung volumes compared to radiographs in April. Stable cardiomegaly and mediastinal contours. Visualized tracheal air column is within normal limits. Pulmonary vascularity appears mildly increased since April. Right upper lobe centrilobular nodularity and small pleural effusions were better demonstrated by CT today. IMPRESSION: 1. Mildly lower lung volumes and increased pulmonary vascularity compared to April radiographs. Stable cardiomegaly. 2. See also Chest CTA findings today reported separately. Electronically Signed   By: Genevie Ann M.D.   On: 12/13/2018 19:32    Time Spent in minutes  No Charge   Phillips Climes M.D on 12/14/2018 at 2:50 PM  Between 7am to 7pm - Pager - [phone]  After 7pm go to [email or web address] - password [facility]  [facility] -  Office  [phone]

## 2018-12-14 NOTE — Progress Notes (Signed)
Pt arrived to the unit @ around 0553.  When nurse entered room pt standing on side of bed on cell phone.  Transport said pt went to bathroom when they brought him up.  Pt stated he "urinated".  Placed back on 2L [place] as pt did state he was having some slight SOB.  Connected to monitors and VS obtained.  Pt alert and oriented.  Skin intact, although pt does have what appears to be a 'dry rash' to his abdominal area and back.  Good strength throughout.

## 2018-12-14 NOTE — TOC Initial Note (Signed)
Transition of Care (TOC) - Initial/Assessment Note    Patient Details  Name: Kaimi Pfisterer. MRN: [id number] Date of Birth: 09-13-1985  Transition of Care [facility]) CM/SW Contact:    Ninfa Meeker, RN Phone Number: [phone] (working remotely) 12/14/2018, 3:34 PM  Clinical Narrative:    33 yr old male admitted for COVID 19 treatment. Patient is on Remdesivir, oxygen at 2L [place]. Case manager will schedule telephonic hospital follow up appt for patient, he is uninsured. Will continue to monitor for needs as patient medically improves. May he be blessed to do so.                    Patient Goals and CMS Choice        Expected Discharge Plan and Services                                                Prior Living Arrangements/Services                       Activities of Daily Living Home Assistive Devices/Equipment: None ADL Screening (condition at time of admission) Patient's cognitive ability adequate to safely complete daily activities?: No Is the patient deaf or have difficulty hearing?: No Does the patient have difficulty seeing, even when wearing glasses/contacts?: No Does the patient have difficulty concentrating, remembering, or making decisions?: No Patient able to express need for assistance with ADLs?: Yes Does the patient have difficulty dressing or bathing?: No Independently performs ADLs?: Yes (appropriate for developmental age) Does the patient have difficulty walking or climbing stairs?: No Weakness of Legs: None Weakness of Arms/Hands: None  Permission Sought/Granted                  Emotional Assessment              Admission diagnosis:  SOB (shortness of breath) [R06.02] Acute on chronic congestive heart failure, unspecified heart failure type ([place]) [I50.9] COVID-19 [U07.1, J98.8] Patient Active Problem List   Diagnosis Date Noted  . COVID-19 virus infection 12/14/2018  . COVID-19 12/13/2018  . Obesity  (BMI 30-39.9)   . OSA on CPAP   . CKD (chronic kidney disease), stage II   . Polysubstance (excluding opioids) dependence ([place])   . Acute on chronic systolic CHF (congestive heart failure) ([place]) 07/12/2018  . Morbid obesity ([place]) 02/07/2017  . Nonischemic cardiomyopathy ([place]) 11/07/2016  . Chronic systolic heart failure ([place]) 11/05/2016  . Anxiety 11/05/2016  . Substance use disorder 11/05/2016  . Pneumonia 10/24/2016   PCP:  Patient, No Pcp Per Pharmacy:   [facility] [place], [address] [place] [address] Phone: [phone] Fax: [phone]     Social Determinants of Health (SDOH) Interventions    Readmission Risk Interventions No flowsheet data found.

## 2018-12-15 LAB — CBC WITH DIFFERENTIAL/PLATELET
Abs Immature Granulocytes: 0.01 10*3/uL (ref 0.00–0.07)
Basophils Absolute: 0 10*3/uL (ref 0.0–0.1)
Basophils Relative: 0 %
Eosinophils Absolute: 0 10*3/uL (ref 0.0–0.5)
Eosinophils Relative: 0 %
HCT: 38.2 % — ABNORMAL LOW (ref 39.0–52.0)
Hemoglobin: 12.8 g/dL — ABNORMAL LOW (ref 13.0–17.0)
Immature Granulocytes: 0 %
Lymphocytes Relative: 18 %
Lymphs Abs: 0.9 10*3/uL (ref 0.7–4.0)
MCH: 30.6 pg (ref 26.0–34.0)
MCHC: 33.5 g/dL (ref 30.0–36.0)
MCV: 91.4 fL (ref 80.0–100.0)
Monocytes Absolute: 0.3 10*3/uL (ref 0.1–1.0)
Monocytes Relative: 7 %
Neutro Abs: 3.6 10*3/uL (ref 1.7–7.7)
Neutrophils Relative %: 75 %
Platelets: 238 10*3/uL (ref 150–400)
RBC: 4.18 MIL/uL — ABNORMAL LOW (ref 4.22–5.81)
RDW: 11.9 % (ref 11.5–15.5)
WBC: 4.9 10*3/uL (ref 4.0–10.5)
nRBC: 0 % (ref 0.0–0.2)

## 2018-12-15 LAB — GLUCOSE, CAPILLARY
Glucose-Capillary: 406 mg/dL — ABNORMAL HIGH (ref 70–99)
Glucose-Capillary: 283 mg/dL — ABNORMAL HIGH (ref 70–99)
Glucose-Capillary: 270 mg/dL — ABNORMAL HIGH (ref 70–99)
Glucose-Capillary: 447 mg/dL — ABNORMAL HIGH (ref 70–99)
Glucose-Capillary: 565 mg/dL (ref 70–99)
Glucose-Capillary: 422 mg/dL — ABNORMAL HIGH (ref 70–99)

## 2018-12-15 LAB — BASIC METABOLIC PANEL
Anion gap: 12 (ref 5–15)
BUN: 28 mg/dL — ABNORMAL HIGH (ref 6–20)
CO2: 23 mmol/L (ref 22–32)
Calcium: 9.6 mg/dL (ref 8.9–10.3)
Chloride: 100 mmol/L (ref 98–111)
Creatinine, Ser: 1.42 mg/dL — ABNORMAL HIGH (ref 0.61–1.24)
GFR calc Af Amer: >60 mL/min (ref >60)
GFR calc non Af Amer: >60 mL/min (ref >60)
Glucose, Bld: 526 mg/dL (ref 70–99)
Potassium: 4.2 mmol/L (ref 3.5–5.1)
Sodium: 135 mmol/L (ref 135–145)

## 2018-12-15 LAB — COMPREHENSIVE METABOLIC PANEL
ALT: 41 U/L (ref 0–44)
AST: 22 U/L (ref 15–41)
Albumin: 3.6 g/dL (ref 3.5–5.0)
Alkaline Phosphatase: 83 U/L (ref 38–126)
Anion gap: 11 (ref 5–15)
BUN: 31 mg/dL — ABNORMAL HIGH (ref 6–20)
CO2: 23 mmol/L (ref 22–32)
Calcium: 9.3 mg/dL (ref 8.9–10.3)
Chloride: 102 mmol/L (ref 98–111)
Creatinine, Ser: 1.29 mg/dL — ABNORMAL HIGH (ref 0.61–1.24)
GFR calc Af Amer: >60 mL/min (ref >60)
GFR calc non Af Amer: >60 mL/min (ref >60)
Glucose, Bld: 345 mg/dL — ABNORMAL HIGH (ref 70–99)
Potassium: 3.3 mmol/L — ABNORMAL LOW (ref 3.5–5.1)
Sodium: 136 mmol/L (ref 135–145)
Total Bilirubin: 0.7 mg/dL (ref 0.3–1.2)
Total Protein: 7 g/dL (ref 6.5–8.1)

## 2018-12-15 LAB — C-REACTIVE PROTEIN: CRP: 1.6 mg/dL — ABNORMAL HIGH (ref <1.0)

## 2018-12-15 LAB — BRAIN NATRIURETIC PEPTIDE: B Natriuretic Peptide: 1161 pg/mL — ABNORMAL HIGH (ref 0.0–100.0)

## 2018-12-15 LAB — D-DIMER, QUANTITATIVE: D-Dimer, Quant: 0.31 ug/mL-FEU (ref 0.00–0.50)

## 2018-12-15 MED ORDER — INSULIN ASPART 100 UNIT/ML ~~LOC~~ SOLN
15.0000 [IU] | Freq: Once | SUBCUTANEOUS | Status: AC
  Administered 2018-12-15: 15 [IU] via SUBCUTANEOUS

## 2018-12-15 MED ORDER — INSULIN ASPART 100 UNIT/ML ~~LOC~~ SOLN
10.0000 [IU] | Freq: Three times a day (TID) | SUBCUTANEOUS | Status: DC
  Administered 2018-12-16 – 2018-12-18 (×7): 10 [IU] via SUBCUTANEOUS

## 2018-12-15 MED ORDER — TORSEMIDE 20 MG PO TABS
40.0000 mg | ORAL_TABLET | Freq: Two times a day (BID) | ORAL | Status: DC
  Administered 2018-12-17: 40 mg via ORAL
  Filled 2018-12-15 (×8): qty 2

## 2018-12-15 MED ORDER — INSULIN ASPART 100 UNIT/ML ~~LOC~~ SOLN
0.0000 [IU] | Freq: Three times a day (TID) | SUBCUTANEOUS | Status: DC
  Administered 2018-12-15 (×2): 20 [IU] via SUBCUTANEOUS
  Administered 2018-12-16: 15 [IU] via SUBCUTANEOUS
  Administered 2018-12-16: 11 [IU] via SUBCUTANEOUS
  Administered 2018-12-16: 20 [IU] via SUBCUTANEOUS
  Administered 2018-12-17: 4 [IU] via SUBCUTANEOUS
  Administered 2018-12-17 (×2): 15 [IU] via SUBCUTANEOUS
  Administered 2018-12-18: 7 [IU] via SUBCUTANEOUS

## 2018-12-15 MED ORDER — INSULIN ASPART 100 UNIT/ML ~~LOC~~ SOLN
0.0000 [IU] | Freq: Every day | SUBCUTANEOUS | Status: DC
  Administered 2018-12-15: 5 [IU] via SUBCUTANEOUS
  Administered 2018-12-16 – 2018-12-17 (×2): 4 [IU] via SUBCUTANEOUS

## 2018-12-15 MED ORDER — INSULIN DETEMIR 100 UNIT/ML ~~LOC~~ SOLN
25.0000 [IU] | Freq: Every day | SUBCUTANEOUS | Status: DC
  Administered 2018-12-15 – 2018-12-18 (×4): 25 [IU] via SUBCUTANEOUS
  Filled 2018-12-15 (×4): qty 0.25

## 2018-12-15 MED ORDER — POTASSIUM CHLORIDE CRYS ER 20 MEQ PO TBCR
40.0000 meq | EXTENDED_RELEASE_TABLET | Freq: Once | ORAL | Status: AC
  Administered 2018-12-15: 40 meq via ORAL
  Filled 2018-12-15: qty 2

## 2018-12-15 MED ORDER — INSULIN ASPART 100 UNIT/ML ~~LOC~~ SOLN
3.0000 [IU] | Freq: Three times a day (TID) | SUBCUTANEOUS | Status: DC
  Administered 2018-12-15: 3 [IU] via SUBCUTANEOUS

## 2018-12-15 NOTE — Progress Notes (Signed)
CRITICAL VALUE ALERT  Critical Value:  Glucose 526  Date & Time Notied: 12/15/18 0223 Provider Notified:Dr. Shanon Brow  Orders Received/Actions taken:

## 2018-12-15 NOTE — Progress Notes (Signed)
Inpatient Diabetes Program Recommendations  AACE/ADA: New Consensus Statement on Inpatient Glycemic Control   Target Ranges:  Prepandial:   less than 140 mg/dL      Peak postprandial:   less than 180 mg/dL (1-2 hours)      Critically ill patients:  140 - 180 mg/dL   Results for CACEY, BARTZ (MRN [id number]) as of 12/15/2018 08:31  Ref. Range 12/14/2018 20:29 12/14/2018 22:44 12/15/2018 02:42 12/15/2018 05:16 12/15/2018 07:38  Glucose-Capillary Latest Ref Range: 70 - 99 mg/dL 457 (H) 522 (HH) 406 (H) 283 (H) 270 (H)   Review of Glycemic Control  Diabetes history: DM2 Outpatient Diabetes medications: Levemir 40 units daily, Novolog 10 units TID with meals Current orders for Inpatient glycemic control: Novolog 0-9 units TID with meals, Novolog 0-5 units QHS; Decadron 6 mg daily  Inpatient Diabetes Program Recommendations:   Insulin-Basal: If steroids are continued, please consider ordering Levemir 25 units daily.  Insulin-Meal Coverage: If steroids are continued, please consider ordering Novolog 5 units TID with meals for meal coverage if patient eats at least 50% of meals.  Insulin-Correction: Please consider increasing Novolog correction to resistant scale (0-20 units).  Thanks, Barnie Alderman, RN, MSN, CDE Diabetes Coordinator Inpatient Diabetes Program [phone] (Team Pager from 8am to 5pm)

## 2018-12-15 NOTE — Progress Notes (Addendum)
   Vital Signs MEWS/VS Documentation      12/15/2018 0957 12/15/2018 1641 12/15/2018 1900 12/15/2018 1930   MEWS Score:  1  1  2  2    MEWS Score Color:  Green  Green  Yellow  Yellow   Resp:  18  18  (!) 25  -   Pulse:  (!) 101  94  88  -   BP:  (!) 88/71  93/81  97/74  -   Temp:  98.6 F (37 C)  98.1 F (36.7 C)  98.5 F (36.9 C)  -   O2 Device:  Nasal Cannula  Room Air  Room Air  -   O2 Flow Rate (L/min):  3 L/min  -  -  -   Level of Consciousness:  -  -  Alert  Alert      Systolic BP improved from earlier today.  Respiratory rate fluctuates with activity.    Will continue to monitor   Carlos Brewer 12/15/2018,7:54 PM      Vital Signs MEWS/VS Documentation      12/15/2018 1641 12/15/2018 1900 12/15/2018 1930 12/15/2018 2114   MEWS Score:  1  2  2  3    MEWS Score Color:  Green  Yellow  Yellow  Yellow   Resp:  18  (!) 25  -  (!) 26   Pulse:  94  88  -  88   BP:  93/81  97/74  -  99/60   Temp:  98.1 F (36.7 C)  98.5 F (36.9 C)  -  97.9 F (36.6 C)   O2 Device:  Room Air  Room Air  -  Nasal Cannula   O2 Flow Rate (L/min):  -  -  -  3 L/min   Level of Consciousness:  -  Alert  Alert  Alert      Systolic BP continues to improve.  RR fluctuates from 16-27  Will continue to monitor  [place], Carlos Brewer 12/15/2018,9:16 PM

## 2018-12-15 NOTE — Progress Notes (Signed)
PROGRESS NOTE  Carlos Brewer. XY:[id number] DOB: [birth year] DOA: 12/13/2018 PCP: Patient, No Pcp Per   LOS: 2 days   Brief Narrative / Interim history: 33 year old male with history of nonischemic cardiomyopathy, systolic CHF with most recent EF 10-15% was admitted to the hospital on 12/13/2018 with several days of shortness of breath, cough, fever.  [place] and [place].  CT angios on admission was negative for PE but it did show bilateral infiltrates.  He was found to be covered positive, hypoxic and was admitted to the hospital  Subjective / 24h Interval events: Feeling better this morning, appreciates his breathing is improved.  He denies any chest pain, denies any palpitations.  No abdominal pain, no nausea or vomiting  Assessment & Plan: Principal Problem:   COVID-19 Active Problems:   Chronic systolic heart failure (HCC)   Nonischemic cardiomyopathy (HCC)   CKD (chronic kidney disease), stage II   COVID-19 virus infection   Principal Problem Acute Hypoxic Respiratory Failure due to Covid-19 Viral Illness -Currently hypoxic requiring 3 L nasal cannula but satting in the upper 90s.  We will wean off as tolerated. -He was started on Remdesivir and Decadron, continue. -Continue tentative spirometry, flutter valve   COVID-19 Labs  Recent Labs    12/13/18 1751 12/14/18 0715 12/15/18 0405  DDIMER 0.53* 0.36 0.31  CRP  --  2.6* 1.6*    Lab Results  Component Value Date   SARSCOV2NAA POSITIVE (A) 12/13/2018   [place] NEGATIVE 07/12/2018    Active Problems Chronic systolic CHF -Most recent echo EF 10-15% earlier 2020.  Left heart cath in 2018 without significant coronary disease, thought to have cardiomyopathy due to alcohol and cocaine -Continue home regimen including beta-blockers, Aldactone, Entresto and Demadex.  Hypotensive this morning, will change Entresto and Aldactone with holding parameters  CKDII -Cr at baseline,  renal dosing meds  Obesity/OSA  -on cpap qhs, cannot use in the Covid hospital  Polysubstance abuse + cocaine  Scheduled Meds: . aspirin EC  81 mg Oral Daily  . atorvastatin  40 mg Oral q1800  . carvedilol  3.125 mg Oral BID WC  . dexamethasone  6 mg Oral Q0600  . digoxin  0.125 mg Oral Daily  . enoxaparin (LOVENOX) injection  60 mg Subcutaneous Q24H  . insulin aspart  0-20 Units Subcutaneous TID WC  . insulin aspart  0-5 Units Subcutaneous QHS  . insulin detemir  25 Units Subcutaneous Daily  . multivitamin with minerals  1 tablet Oral Daily  . pantoprazole  40 mg Oral Daily  . sacubitril-valsartan  1 tablet Oral BID  . sodium chloride flush  3 mL Intravenous Q12H  . spironolactone  25 mg Oral Daily  . torsemide  40 mg Oral BID  . vitamin C  500 mg Oral Daily  . zinc sulfate  220 mg Oral Daily   Continuous Infusions: . sodium chloride    . remdesivir 100 mg in NS 250 mL 100 mg (12/15/18 1034)   PRN Meds:.sodium chloride, acetaminophen, chlorpheniramine-HYDROcodone, guaiFENesin-dextromethorphan, hydrocortisone cream, ondansetron **OR** ondansetron (ZOFRAN) IV, sodium chloride flush  DVT prophylaxis: Lovenox Code Status: Full code Family Communication: d/w patient  Disposition Plan: home when ready   Consultants:  None   Procedures:  None   Microbiology: None   Antimicrobials: None    Objective: Vitals:   12/14/18 2023 12/15/18 0407 12/15/18 0739 12/15/18 0957  BP: 118/87 109/60 (!) 85/67 (!) 88/71  Pulse: 95 89 94 (!) 101  Resp: 20 20 (!) 21 18  Temp: (!) 97.3 F (36.3 C) 97.7 F (36.5 C) 98.4 F (36.9 C) 98.6 F (37 C)  TempSrc: Oral Oral Oral Oral  SpO2: 97% 98% 99% 99%  Weight:      Height:        Intake/Output Summary (Last 24 hours) at 12/15/2018 1207 Last data filed at 12/15/2018 0630 Gross per 24 hour  Intake 1920 ml  Output 1550 ml  Net 370 ml   Filed Weights   12/14/18 0608  Weight: 127.1 kg    Examination:  Constitutional: NAD  Eyes: PERRL, lids and conjunctivae normal ENMT: Mucous membranes are moist.  Respiratory: clear to auscultation bilaterally, no wheezing, no crackles. Normal respiratory effort.  Cardiovascular: Regular rate and rhythm, no murmurs / rubs / gallops. No LE edema. 2+ pedal pulses. Abdomen: no tenderness. Bowel sounds positive.  Musculoskeletal: no clubbing / cyanosis.  Skin: no rashes Neurologic: CN 2-12 grossly intact. Strength 5/5 in all 4.  Psychiatric: Normal judgment and insight. Alert and oriented x 3. Normal mood.    Data Reviewed: I have independently reviewed following labs and imaging studies   CBC: Recent Labs  Lab 12/13/18 1751 12/14/18 0715 12/15/18 0405  WBC 5.2 4.2 4.9  NEUTROABS 3.7  --  3.6  HGB 13.3 12.8* 12.8*  HCT 40.5 37.6* 38.2*  MCV 93.5 91.5 91.4  PLT 227 224 [id number]   Basic Metabolic Panel: Recent Labs  Lab 12/13/18 1751 12/14/18 0715 12/14/18 2315 12/15/18 0405  NA 136  --  135 136  K 3.9  --  4.2 3.3*  CL 99  --  100 102  CO2 26  --  23 23  GLUCOSE 206*  --  526* 345*  BUN 12  --  28* 31*  CREATININE 1.44* 1.28* 1.42* 1.29*  CALCIUM 9.8  --  9.6 9.3   GFR: Estimated Creatinine Clearance: 111.2 mL/min (A) (by C-G formula based on SCr of 1.29 mg/dL (H)). Liver Function Tests: Recent Labs  Lab 12/13/18 1751 12/15/18 0405  AST 35 22  ALT 45* 41  ALKPHOS 68 83  BILITOT 1.0 0.7  PROT 6.9 7.0  ALBUMIN 3.8 3.6   No results for input(s): LIPASE, AMYLASE in the last 168 hours. No results for input(s): AMMONIA in the last 168 hours. Coagulation Profile: No results for input(s): INR, PROTIME in the last 168 hours. Cardiac Enzymes: No results for input(s): CKTOTAL, CKMB, CKMBINDEX, TROPONINI in the last 168 hours. BNP (last 3 results) No results for input(s): PROBNP in the last 8760 hours. HbA1C: Recent Labs    12/14/18 2047  HGBA1C 9.7*   CBG: Recent Labs  Lab 12/14/18 2244 12/15/18 0242 12/15/18 0516 12/15/18 0738 12/15/18 1107   GLUCAP 522* 406* 283* 270* 447*   Lipid Profile: No results for input(s): CHOL, HDL, LDLCALC, TRIG, CHOLHDL, LDLDIRECT in the last 72 hours. Thyroid Function Tests: No results for input(s): TSH, T4TOTAL, FREET4, T3FREE, THYROIDAB in the last 72 hours. Anemia Panel: No results for input(s): VITAMINB12, FOLATE, FERRITIN, TIBC, IRON, RETICCTPCT in the last 72 hours. Urine analysis:    Component Value Date/Time   COLORURINE YELLOW 08/28/2018 0045   APPEARANCEUR CLEAR 08/28/2018 0045   LABSPEC 1.015 08/28/2018 0045   PHURINE 6.0 08/28/2018 0045   GLUCOSEU >=500 (A) 08/28/2018 0045   HGBUR SMALL (A) 08/28/2018 0045   BILIRUBINUR NEGATIVE 08/28/2018 0045   KETONESUR NEGATIVE 08/28/2018 0045   PROTEINUR 30 (A) 08/28/2018 0045   NITRITE NEGATIVE 08/28/2018 0045  LEUKOCYTESUR NEGATIVE 08/28/2018 0045   Sepsis Labs: Invalid input(s): PROCALCITONIN, LACTICIDVEN  Recent Results (from the past 240 hour(s))  SARS Coronavirus 2 [facility] order, Performed in [facility] hospital lab) Nasopharyngeal Nasopharyngeal Swab     Status: Abnormal   Collection Time: 12/13/18  7:40 PM   Specimen: Nasopharyngeal Swab  Result Value Ref Range Status   SARS Coronavirus 2 POSITIVE (A) NEGATIVE Final    Comment: CRITICAL RESULT CALLED TO, READ BACK BY AND VERIFIED WITH: KELLIE NEAL RN @2028  12/13/2018 OLSONM (NOTE) If result is NEGATIVE SARS-CoV-2 target nucleic acids are NOT DETECTED. The SARS-CoV-2 RNA is generally detectable in upper and lower  respiratory specimens during the acute phase of infection. The lowest  concentration of SARS-CoV-2 viral copies this assay can detect is 250  copies / mL. A negative result does not preclude SARS-CoV-2 infection  and should not be used as the sole basis for treatment or other  patient management decisions.  A negative result may occur with  improper specimen collection / handling, submission of specimen other  than nasopharyngeal swab, presence of viral  mutation(s) within the  areas targeted by this assay, and inadequate number of viral copies  (<250 copies / mL). A negative result must be combined with clinical  observations, patient history, and epidemiological information. If result is POSITIVE SARS-CoV-2 target nucleic acids are  DETECTED. The SARS-CoV-2 RNA is generally detectable in upper and lower  respiratory specimens during the acute phase of infection.  Positive  results are indicative of active infection with SARS-CoV-2.  Clinical  correlation with patient history and other diagnostic information is  necessary to determine patient infection status.  Positive results do  not rule out bacterial infection or co-infection with other viruses. If result is PRESUMPTIVE POSTIVE SARS-CoV-2 nucleic acids MAY BE PRESENT.   A presumptive positive result was obtained on the submitted specimen  and confirmed on repeat testing.  While 2019 novel coronavirus  (SARS-CoV-2) nucleic acids may be present in the submitted sample  additional confirmatory testing may be necessary for epidemiological  and / or clinical management purposes  to differentiate between  SARS-CoV-2 and other Sarbecovirus currently known to infect humans.  If clinically indicated additional testing with an alternate test  methodology  [phone]) is advised. The SARS-CoV-2 RNA is generally  detectable in upper and lower respiratory specimens during the acute  phase of infection. The expected result is Negative. Fact Sheet for Patients:  StrictlyIdeas.no Fact Sheet for Healthcare Providers: BankingDealers.co.za This test is not yet approved or cleared by the [place] FDA and has been authorized for detection and/or diagnosis of SARS-CoV-2 by FDA under an Emergency Use Authorization (EUA).  This EUA will remain in effect (meaning this test can be used) for the duration of the COVID-19 declaration under Section 564(b)(1)  of the Act, 21 U.S.C. section 360bbb-3(b)(1), unless the authorization is terminated or revoked sooner. Performed at [facility], [address] [place], [place] [address]   Culture, blood (Routine X 2) w Reflex to ID Panel     Status: None (Preliminary result)   Collection Time: 12/13/18  8:30 PM   Specimen: BLOOD RIGHT HAND  Result Value Ref Range Status   Specimen Description   Final    BLOOD RIGHT HAND Performed at [facility], [place] [address], [place], [place] [address]    Special Requests   Final    BOTTLES DRAWN AEROBIC ONLY Blood Culture results may not be optimal due  to an inadequate volume of blood received in culture bottles Performed at [facility], [address] [place], [place] [address]    Culture   Final    NO GROWTH 2 DAYS Performed at [facility], [place] [address], [place], [place] [address]    Report Status PENDING  Incomplete  Culture, blood (Routine X 2) w Reflex to ID Panel     Status: None (Preliminary result)   Collection Time: 12/13/18  8:32 PM   Specimen: BLOOD  Result Value Ref Range Status   Specimen Description   Final    BLOOD RIGHT ANTECUBITAL Performed at [facility], [address] [place], [place] [address]    Special Requests   Final    BOTTLES DRAWN AEROBIC AND ANAEROBIC Blood Culture adequate volume Performed at [facility], [address] [place], [place] [address]    Culture   Final    NO GROWTH 2 DAYS Performed at [facility], [place] [address] [place], [place] [address]    Report Status PENDING  Incomplete      Radiology Studies: Ct Angio Chest Pe W Or Wo Contrast  Result Date: 12/13/2018 CLINICAL DATA:  33 year old male with shortness of breath for 2 days. Abnormal D-dimer. EXAM: CT ANGIOGRAPHY CHEST WITH CONTRAST TECHNIQUE: Multidetector CT imaging of the chest was performed using the standard protocol during bolus administration of intravenous contrast.  Multiplanar CT image reconstructions and MIPs were obtained to evaluate the vascular anatomy. CONTRAST:  136mL OMNIPAQUE IOHEXOL 350 MG/ML SOLN COMPARISON:  Portable chest the same day. Chest CTA 04/10/2018. FINDINGS: Cardiovascular: Good contrast bolus timing in the pulmonary arterial tree. No focal filling defect identified in the pulmonary arteries to suggest acute pulmonary embolism. Stable cardiomegaly. No pericardial effusion. No contrast in the aorta today. No aortic atherosclerosis identified. Mediastinum/Nodes: Prominent right paratracheal lymph nodes individually up to 14 millimeters short axis are stable since January but nonspecific. Smaller subcentimeter prevascular nodes are stable. More normal appearing bilateral hilar nodes are also stable. No axillary or thoracic inlet lymphadenopathy. Lungs/Pleura: Major airways are patent. There is centrilobular ground-glass nodularity throughout the right upper lobe (series 5, image 37) with ventilation elsewhere appearing stable to that in January. Mild mosaic attenuation in both lower lobes again noted. Trace layering right pleural effusion. Trace left pleural fluid. Mild linear atelectasis or scarring along the left hilum. Upper Abdomen: Mild reflux of contrast into the hepatic IVC and hepatic veins. Questionable hepatic steatosis. Negative visible spleen, pancreas, adrenal glands, stomach and left renal upper pole. Musculoskeletal: Left anterior 5th rib fracture appears partially healed on series 5, image 61 and was not apparent in June. No superimposed No acute osseous abnormality identified. Review of the MIP images confirms the above findings. IMPRESSION: 1. No evidence of acute pulmonary embolus. 2. Isolated right upper lobe centrilobular ground-glass nodularity is most suspicious for an acute respiratory infection. Differential considerations include hypersensitivity pneumonitis, pulmonary vasculitis. 3. Chronic cardiomegaly with trace layering right and  trace left pleural effusions today. Mild reflux of contrast into the hepatic veins raising the possibility of right heart failure. 4. Nonspecific mediastinal lymphadenopathy is stable since January. This might be reactive in the setting of recurrent CHF (#3). 5. Partially healed left anterior 5th rib fracture. Electronically Signed   By: Genevie Ann M.D.   On: 12/13/2018 19:30   Dg Chest Port 1 View  Result Date: 12/13/2018 CLINICAL DATA:  33 year old male with shortness  of breath for 2 days. Abnormal D-dimer. EXAM: PORTABLE CHEST 1 VIEW COMPARISON:  Chest CTA today reported separately. FINDINGS: Portable AP upright view at 1907 hours. Lower lung volumes compared to radiographs in April. Stable cardiomegaly and mediastinal contours. Visualized tracheal air column is within normal limits. Pulmonary vascularity appears mildly increased since April. Right upper lobe centrilobular nodularity and small pleural effusions were better demonstrated by CT today. IMPRESSION: 1. Mildly lower lung volumes and increased pulmonary vascularity compared to April radiographs. Stable cardiomegaly. 2. See also Chest CTA findings today reported separately. Electronically Signed   By: Genevie Ann M.D.   On: 12/13/2018 19:32    Marzetta Board, MD, PhD [facility]  Contact via  [email or web address]  [place] P: [phone] F: [phone]

## 2018-12-15 NOTE — Progress Notes (Signed)
Souse attempted to be contacted. No answer.

## 2018-12-16 LAB — CBC WITH DIFFERENTIAL/PLATELET
Abs Immature Granulocytes: 0.05 10*3/uL (ref 0.00–0.07)
Basophils Absolute: 0 10*3/uL (ref 0.0–0.1)
Basophils Relative: 0 %
Eosinophils Absolute: 0 10*3/uL (ref 0.0–0.5)
Eosinophils Relative: 0 %
HCT: 39.2 % (ref 39.0–52.0)
Hemoglobin: 13.3 g/dL (ref 13.0–17.0)
Immature Granulocytes: 1 %
Lymphocytes Relative: 17 %
Lymphs Abs: 1.4 10*3/uL (ref 0.7–4.0)
MCH: 31.1 pg (ref 26.0–34.0)
MCHC: 33.9 g/dL (ref 30.0–36.0)
MCV: 91.6 fL (ref 80.0–100.0)
Monocytes Absolute: 0.5 10*3/uL (ref 0.1–1.0)
Monocytes Relative: 6 %
Neutro Abs: 6.3 10*3/uL (ref 1.7–7.7)
Neutrophils Relative %: 76 %
Platelets: 268 10*3/uL (ref 150–400)
RBC: 4.28 MIL/uL (ref 4.22–5.81)
RDW: 12.1 % (ref 11.5–15.5)
WBC: 8.2 10*3/uL (ref 4.0–10.5)
nRBC: 0 % (ref 0.0–0.2)

## 2018-12-16 LAB — COMPREHENSIVE METABOLIC PANEL
ALT: 37 U/L (ref 0–44)
AST: 19 U/L (ref 15–41)
Albumin: 3.5 g/dL (ref 3.5–5.0)
Alkaline Phosphatase: 83 U/L (ref 38–126)
Anion gap: 8 (ref 5–15)
BUN: 30 mg/dL — ABNORMAL HIGH (ref 6–20)
CO2: 27 mmol/L (ref 22–32)
Calcium: 9.2 mg/dL (ref 8.9–10.3)
Chloride: 100 mmol/L (ref 98–111)
Creatinine, Ser: 1.23 mg/dL (ref 0.61–1.24)
GFR calc Af Amer: >60 mL/min (ref >60)
GFR calc non Af Amer: >60 mL/min (ref >60)
Glucose, Bld: 289 mg/dL — ABNORMAL HIGH (ref 70–99)
Potassium: 4 mmol/L (ref 3.5–5.1)
Sodium: 135 mmol/L (ref 135–145)
Total Bilirubin: 0.4 mg/dL (ref 0.3–1.2)
Total Protein: 7 g/dL (ref 6.5–8.1)

## 2018-12-16 LAB — GLUCOSE, CAPILLARY
Glucose-Capillary: 282 mg/dL — ABNORMAL HIGH (ref 70–99)
Glucose-Capillary: 273 mg/dL — ABNORMAL HIGH (ref 70–99)
Glucose-Capillary: 331 mg/dL — ABNORMAL HIGH (ref 70–99)
Glucose-Capillary: 443 mg/dL — ABNORMAL HIGH (ref 70–99)
Glucose-Capillary: 333 mg/dL — ABNORMAL HIGH (ref 70–99)

## 2018-12-16 LAB — C-REACTIVE PROTEIN: CRP: 1.2 mg/dL — ABNORMAL HIGH (ref <1.0)

## 2018-12-16 LAB — D-DIMER, QUANTITATIVE: D-Dimer, Quant: 0.3 ug/mL-FEU (ref 0.00–0.50)

## 2018-12-16 MED ORDER — DIPHENHYDRAMINE HCL 25 MG PO CAPS
50.0000 mg | ORAL_CAPSULE | Freq: Every evening | ORAL | Status: DC | PRN
  Administered 2018-12-16: 50 mg via ORAL
  Filled 2018-12-16: qty 2

## 2018-12-16 MED ORDER — FLUOXETINE HCL 20 MG PO CAPS
20.0000 mg | ORAL_CAPSULE | Freq: Every day | ORAL | Status: DC
  Administered 2018-12-16 – 2018-12-18 (×3): 20 mg via ORAL
  Filled 2018-12-16 (×3): qty 1

## 2018-12-16 NOTE — Progress Notes (Signed)
PROGRESS NOTE  Carlos Brewer. [id number] DOB: [birth year] DOA: 12/13/2018 PCP: Patient, No Pcp Per   LOS: 3 days   Brief Narrative / Interim history: 33 year old male with history of nonischemic cardiomyopathy, systolic CHF with most recent EF 10-15% was admitted to the hospital on 12/13/2018 with several days of shortness of breath, cough, fever.  [place].  CT angios on admission was negative for PE but it did show bilateral infiltrates.  He was found to be covered positive, hypoxic and was admitted to the hospital  Subjective / 24h Interval events: Had to use more oxygen overnight because of CPAP use at home and inability to use that here but he is on room air this morning, no shortness of breath, no chest pain  Assessment & Plan: Principal Problem:   COVID-19 Active Problems:   Chronic systolic heart failure (HCC)   Nonischemic cardiomyopathy (HCC)   CKD (chronic kidney disease), stage II   COVID-19 virus infection   Principal Problem Acute Hypoxic Respiratory Failure due to Covid-19 Viral Illness -Currently on room air but did require 3 L overnight due to history of sleep apnea -He was started on Remdesivir and Decadron, continue.  Last dose of Remdesivir is on Saturday -Continue incentive spirometry, flutter valve -CRP improving   COVID-19 Labs  Recent Labs    12/14/18 0715 12/15/18 0405 12/16/18 0242  DDIMER 0.36 0.31 0.30  CRP 2.6* 1.6* 1.2*    Lab Results  Component Value Date   SARSCOV2NAA POSITIVE (A) 12/13/2018   [place] NEGATIVE 07/12/2018    Active Problems Chronic systolic CHF -Most recent echo EF 10-15% earlier 2020.  Left heart cath in 2018 without significant coronary disease, thought to have cardiomyopathy due to alcohol and cocaine -Continue home regimen including beta-blockers, Aldactone, Entresto and Demadex. -Normally blood pressure runs in the 90s at home.  Asymptomatic.  Euvolemic.   Type 2 diabetes mellitus -Recently diagnosed, currently on steroids and CBGs are running on the high side.  CBG (last 3)  Recent Labs    12/15/18 2119 12/16/18 0323 12/16/18 0750  GLUCAP 422* 282* 273*     CKDII -Creatinine at baseline  Obesity/OSA  -on cpap qhs, cannot use in the Covid hospital  Polysubstance abuse + cocaine  Depression/PTSD -Seen at [facility], was supposed to be started on Prozac however that medication was not called in and he was not given a prescription.  Initiate Prozac here  Scheduled Meds: . aspirin EC  81 mg Oral Daily  . atorvastatin  40 mg Oral q1800  . carvedilol  3.125 mg Oral BID WC  . dexamethasone  6 mg Oral Q0600  . digoxin  0.125 mg Oral Daily  . enoxaparin (LOVENOX) injection  60 mg Subcutaneous Q24H  . FLUoxetine  20 mg Oral Daily  . insulin aspart  0-20 Units Subcutaneous TID WC  . insulin aspart  0-5 Units Subcutaneous QHS  . insulin aspart  10 Units Subcutaneous TID WC  . insulin detemir  25 Units Subcutaneous Daily  . multivitamin with minerals  1 tablet Oral Daily  . pantoprazole  40 mg Oral Daily  . sacubitril-valsartan  1 tablet Oral BID  . sodium chloride flush  3 mL Intravenous Q12H  . spironolactone  25 mg Oral Daily  . torsemide  40 mg Oral BID  . vitamin C  500 mg Oral Daily  . zinc sulfate  220 mg Oral Daily   Continuous Infusions: . sodium chloride    .  remdesivir 100 mg in NS 250 mL 100 mg (12/16/18 0947)   PRN Meds:.sodium chloride, acetaminophen, chlorpheniramine-HYDROcodone, guaiFENesin-dextromethorphan, hydrocortisone cream, ondansetron **OR** ondansetron (ZOFRAN) IV, sodium chloride flush  DVT prophylaxis: Lovenox Code Status: Full code Family Communication: d/w patient  Disposition Plan: home when ready   Consultants:  None   Procedures:  None   Microbiology: None   Antimicrobials: None    Objective: Vitals:   12/15/18 2114 12/16/18 0044 12/16/18 0328 12/16/18 0753  BP: 99/60 94/61 95/67   98/65  Pulse: 88  86 87  Resp: (!) 26  12 14   Temp: 97.9 F (36.6 C) 98.6 F (37 C) 98.3 F (36.8 C) 98.5 F (36.9 C)  TempSrc: Oral Oral Oral Oral  SpO2: 100%  100% 100%  Weight:      Height:        Intake/Output Summary (Last 24 hours) at 12/16/2018 1113 Last data filed at 12/16/2018 0649 Gross per 24 hour  Intake 1920 ml  Output 2050 ml  Net -130 ml   Filed Weights   12/14/18 0608  Weight: 127.1 kg    Examination:  Constitutional: No distress Eyes: No scleral icterus ENMT: Moist mucous membranes Respiratory: Clear bilaterally, no wheezing, no crackles Cardiovascular: Regular rate and rhythm, no murmurs.  No peripheral edema Abdomen: Soft, no distention, bowel sounds positive Musculoskeletal: no clubbing / cyanosis.  Skin: No rashes seen Neurologic: Nonfocal, equal strength   Data Reviewed: I have independently reviewed following labs and imaging studies   CBC: Recent Labs  Lab 12/13/18 1751 12/14/18 0715 12/15/18 0405 12/16/18 0242  WBC 5.2 4.2 4.9 8.2  NEUTROABS 3.7  --  3.6 6.3  HGB 13.3 12.8* 12.8* 13.3  HCT 40.5 37.6* 38.2* 39.2  MCV 93.5 91.5 91.4 91.6  PLT 227 224 238 [id number]   Basic Metabolic Panel: Recent Labs  Lab 12/13/18 1751 12/14/18 0715 12/14/18 2315 12/15/18 0405 12/16/18 0242  NA 136  --  135 136 135  K 3.9  --  4.2 3.3* 4.0  CL 99  --  100 102 100  CO2 26  --  23 23 27   GLUCOSE 206*  --  526* 345* 289*  BUN 12  --  28* 31* 30*  CREATININE 1.44* 1.28* 1.42* 1.29* 1.23  CALCIUM 9.8  --  9.6 9.3 9.2   GFR: Estimated Creatinine Clearance: 116.6 mL/min (by C-G formula based on SCr of 1.23 mg/dL). Liver Function Tests: Recent Labs  Lab 12/13/18 1751 12/15/18 0405 12/16/18 0242  AST 35 22 19  ALT 45* 41 37  ALKPHOS 68 83 83  BILITOT 1.0 0.7 0.4  PROT 6.9 7.0 7.0  ALBUMIN 3.8 3.6 3.5   No results for input(s): LIPASE, AMYLASE in the last 168 hours. No results for input(s): AMMONIA in the last 168 hours. Coagulation  Profile: No results for input(s): INR, PROTIME in the last 168 hours. Cardiac Enzymes: No results for input(s): CKTOTAL, CKMB, CKMBINDEX, TROPONINI in the last 168 hours. BNP (last 3 results) No results for input(s): PROBNP in the last 8760 hours. HbA1C: Recent Labs    12/14/18 2047  HGBA1C 9.7*   CBG: Recent Labs  Lab 12/15/18 1107 12/15/18 1641 12/15/18 2119 12/16/18 0323 12/16/18 0750  GLUCAP 447* 565* 422* 282* 273*   Lipid Profile: No results for input(s): CHOL, HDL, LDLCALC, TRIG, CHOLHDL, LDLDIRECT in the last 72 hours. Thyroid Function Tests: No results for input(s): TSH, T4TOTAL, FREET4, T3FREE, THYROIDAB in the last 72 hours. Anemia Panel: No results for input(s):  VITAMINB12, FOLATE, FERRITIN, TIBC, IRON, RETICCTPCT in the last 72 hours. Urine analysis:    Component Value Date/Time   COLORURINE YELLOW 08/28/2018 0045   APPEARANCEUR CLEAR 08/28/2018 0045   LABSPEC 1.015 08/28/2018 0045   PHURINE 6.0 08/28/2018 0045   GLUCOSEU >=500 (A) 08/28/2018 0045   HGBUR SMALL (A) 08/28/2018 0045   BILIRUBINUR NEGATIVE 08/28/2018 0045   KETONESUR NEGATIVE 08/28/2018 0045   PROTEINUR 30 (A) 08/28/2018 0045   NITRITE NEGATIVE 08/28/2018 0045   LEUKOCYTESUR NEGATIVE 08/28/2018 0045   Sepsis Labs: Invalid input(s): PROCALCITONIN, LACTICIDVEN  Recent Results (from the past 240 hour(s))  SARS Coronavirus 2 [facility] order, Performed in [facility] hospital lab) Nasopharyngeal Nasopharyngeal Swab     Status: Abnormal   Collection Time: 12/13/18  7:40 PM   Specimen: Nasopharyngeal Swab  Result Value Ref Range Status   SARS Coronavirus 2 POSITIVE (A) NEGATIVE Final    Comment: CRITICAL RESULT CALLED TO, READ BACK BY AND VERIFIED WITH: KELLIE NEAL RN @2028  12/13/2018 OLSONM (NOTE) If result is NEGATIVE SARS-CoV-2 target nucleic acids are NOT DETECTED. The SARS-CoV-2 RNA is generally detectable in upper and lower  respiratory specimens during the acute phase of infection.  The lowest  concentration of SARS-CoV-2 viral copies this assay can detect is 250  copies / mL. A negative result does not preclude SARS-CoV-2 infection  and should not be used as the sole basis for treatment or other  patient management decisions.  A negative result may occur with  improper specimen collection / handling, submission of specimen other  than nasopharyngeal swab, presence of viral mutation(s) within the  areas targeted by this assay, and inadequate number of viral copies  (<250 copies / mL). A negative result must be combined with clinical  observations, patient history, and epidemiological information. If result is POSITIVE SARS-CoV-2 target nucleic acids are  DETECTED. The SARS-CoV-2 RNA is generally detectable in upper and lower  respiratory specimens during the acute phase of infection.  Positive  results are indicative of active infection with SARS-CoV-2.  Clinical  correlation with patient history and other diagnostic information is  necessary to determine patient infection status.  Positive results do  not rule out bacterial infection or co-infection with other viruses. If result is PRESUMPTIVE POSTIVE SARS-CoV-2 nucleic acids MAY BE PRESENT.   A presumptive positive result was obtained on the submitted specimen  and confirmed on repeat testing.  While 2019 novel coronavirus  (SARS-CoV-2) nucleic acids may be present in the submitted sample  additional confirmatory testing may be necessary for epidemiological  and / or clinical management purposes  to differentiate between  SARS-CoV-2 and other Sarbecovirus currently known to infect humans.  If clinically indicated additional testing with an alternate test  methodology  [phone]) is advised. The SARS-CoV-2 RNA is generally  detectable in upper and lower respiratory specimens during the acute  phase of infection. The expected result is Negative. Fact Sheet for Patients:  StrictlyIdeas.no  Fact Sheet for Healthcare Providers: BankingDealers.co.za This test is not yet approved or cleared by the [place] FDA and has been authorized for detection and/or diagnosis of SARS-CoV-2 by FDA under an Emergency Use Authorization (EUA).  This EUA will remain in effect (meaning this test can be used) for the duration of the COVID-19 declaration under Section 564(b)(1) of the Act, 21 U.S.C. section 360bbb-3(b)(1), unless the authorization is terminated or revoked sooner. Performed at [facility], [address] [place], [place] [address]   Culture, blood (Routine  X 2) w Reflex to ID Panel     Status: None (Preliminary result)   Collection Time: 12/13/18  8:30 PM   Specimen: BLOOD RIGHT HAND  Result Value Ref Range Status   Specimen Description   Final    BLOOD RIGHT HAND Performed at [facility], [place] [address], [place], [place] [address]    Special Requests   Final    BOTTLES DRAWN AEROBIC ONLY Blood Culture results may not be optimal due to an inadequate volume of blood received in culture bottles Performed at [facility], [address] [place], [place] [address]    Culture   Final    NO GROWTH 3 DAYS Performed at [facility], [place] [address], [place], [place] [address]    Report Status PENDING  Incomplete  Culture, blood (Routine X 2) w Reflex to ID Panel     Status: None (Preliminary result)   Collection Time: 12/13/18  8:32 PM   Specimen: BLOOD  Result Value Ref Range Status   Specimen Description   Final    BLOOD RIGHT ANTECUBITAL Performed at [facility], [address] [place], [place] [address]    Special Requests   Final    BOTTLES DRAWN AEROBIC AND ANAEROBIC Blood Culture adequate volume Performed at [facility], [address] [place], [place] [address]    Culture   Final    NO GROWTH 3 DAYS Performed at [facility], [place] [address] [place], [place] [address]     Report Status PENDING  Incomplete      Radiology Studies: No results found.  Marzetta Board, MD, PhD [facility]  Contact via  [email or web address]  [place] P: [phone] F: [phone]

## 2018-12-16 NOTE — Progress Notes (Signed)
Inpatient Diabetes Program Recommendations  AACE/ADA: New Consensus Statement on Inpatient Glycemic Control   Target Ranges:  Prepandial:   less than 140 mg/dL      Peak postprandial:   less than 180 mg/dL (1-2 hours)      Critically ill patients:  140 - 180 mg/dL   Results for UNDRA, BLAZIER (MRN [id number]) as of 12/16/2018 09:12  Ref. Range 12/15/2018 07:38 12/15/2018 11:07 12/15/2018 16:41 12/15/2018 21:19 12/16/2018 03:23 12/16/2018 07:50  Glucose-Capillary Latest Ref Range: 70 - 99 mg/dL 270 (H) 447 (H) 565 (HH) 422 (H) 282 (H) 273 (H)    Review of Glycemic Control  Diabetes history: DM2 Outpatient Diabetes medications: Levemir 40 units daily, Novolog 10 units TID with meals Current orders for Inpatient glycemic control: Levemir 25 units daily, Novolog 0-20 units TID with meals, Novolog 0-5 units QHS, Novolog 10 units TID with meals for meal coverage; Decadron 6 mg daily  Inpatient Diabetes Program Recommendations:   Insulin-Basal: If steroids are continued, please consider ordering Levemir to 35 units daily.  Thanks, Barnie Alderman, RN, MSN, CDE Diabetes Coordinator Inpatient Diabetes Program [phone] (Team Pager from 8am to 5pm)

## 2018-12-16 NOTE — Progress Notes (Signed)
Attempted call, no answer by wife

## 2018-12-17 LAB — BASIC METABOLIC PANEL
Anion gap: 9 (ref 5–15)
BUN: 24 mg/dL — ABNORMAL HIGH (ref 6–20)
CO2: 25 mmol/L (ref 22–32)
Calcium: 9 mg/dL (ref 8.9–10.3)
Chloride: 103 mmol/L (ref 98–111)
Creatinine, Ser: 1.05 mg/dL (ref 0.61–1.24)
GFR calc Af Amer: >60 mL/min (ref >60)
GFR calc non Af Amer: >60 mL/min (ref >60)
Glucose, Bld: 233 mg/dL — ABNORMAL HIGH (ref 70–99)
Potassium: 3.8 mmol/L (ref 3.5–5.1)
Sodium: 137 mmol/L (ref 135–145)

## 2018-12-17 LAB — D-DIMER, QUANTITATIVE: D-Dimer, Quant: 0.34 ug/mL-FEU (ref 0.00–0.50)

## 2018-12-17 LAB — COMPREHENSIVE METABOLIC PANEL
ALT: 32 U/L (ref 0–44)
AST: 15 U/L (ref 15–41)
Albumin: 3.5 g/dL (ref 3.5–5.0)
Alkaline Phosphatase: 83 U/L (ref 38–126)
Anion gap: 8 (ref 5–15)
BUN: 24 mg/dL — ABNORMAL HIGH (ref 6–20)
CO2: 25 mmol/L (ref 22–32)
Calcium: 9 mg/dL (ref 8.9–10.3)
Chloride: 103 mmol/L (ref 98–111)
Creatinine, Ser: 1.06 mg/dL (ref 0.61–1.24)
GFR calc Af Amer: >60 mL/min (ref >60)
GFR calc non Af Amer: >60 mL/min (ref >60)
Glucose, Bld: 224 mg/dL — ABNORMAL HIGH (ref 70–99)
Potassium: 3.9 mmol/L (ref 3.5–5.1)
Sodium: 136 mmol/L (ref 135–145)
Total Bilirubin: 0.6 mg/dL (ref 0.3–1.2)
Total Protein: 6.6 g/dL (ref 6.5–8.1)

## 2018-12-17 LAB — GLUCOSE, CAPILLARY
Glucose-Capillary: 152 mg/dL — ABNORMAL HIGH (ref 70–99)
Glucose-Capillary: 303 mg/dL — ABNORMAL HIGH (ref 70–99)
Glucose-Capillary: 340 mg/dL — ABNORMAL HIGH (ref 70–99)
Glucose-Capillary: 318 mg/dL — ABNORMAL HIGH (ref 70–99)

## 2018-12-17 LAB — C-REACTIVE PROTEIN: CRP: 0.8 mg/dL (ref <1.0)

## 2018-12-17 NOTE — Progress Notes (Signed)
PROGRESS NOTE  Carlos Brewer. XY:[id number] DOB: 1985/10/10 DOA: 12/13/2018 PCP: Patient, No Pcp Per   LOS: 4 days   Brief Narrative / Interim history: 33 year old male with history of nonischemic cardiomyopathy, systolic CHF with most recent EF 10-15% was admitted to the hospital on 12/13/2018 with several days of shortness of breath, cough, fever.  [place] and [place].  CT angios on admission was negative for PE but it did show bilateral infiltrates.  He was found to be covered positive, hypoxic and was admitted to the hospital  Subjective / 24h Interval events: No significant overnight events, on room air this morning.  Slept with oxygen on because he is on CPAP at home  Assessment & Plan: Principal Problem:   COVID-19 Active Problems:   Chronic systolic heart failure (HCC)   Nonischemic cardiomyopathy (HCC)   CKD (chronic kidney disease), stage II   COVID-19 virus infection   Principal Problem Acute Hypoxic Respiratory Failure due to Covid-19 Viral Illness -On room air during the daytime, on oxygen overnight due to inability to use CPAP -He was started on Remdesivir and Decadron, continue.  Last dose of Remdesivir is tomorrow and then anticipate discharge -Continue incentive spirometry, flutter valve -Inflammatory labs continue to improve   COVID-19 Labs  Recent Labs    12/15/18 0405 12/16/18 0242 12/17/18 0130  DDIMER 0.31 0.30 0.34  CRP 1.6* 1.2* 0.8    Lab Results  Component Value Date   SARSCOV2NAA POSITIVE (A) 12/13/2018   [place] NEGATIVE 07/12/2018    Active Problems Chronic systolic CHF -Most recent echo EF 10-15% earlier 2020.  Left heart cath in 2018 without significant coronary disease, thought to have cardiomyopathy due to alcohol and cocaine -Continue home regimen including beta-blockers, Aldactone, Entresto and Demadex. -Normally blood pressure runs in the 90s at home.  Asymptomatic.  Euvolemic.  Type 2  diabetes mellitus -Recently diagnosed, currently on steroids and CBGs are running on the high side.  CBG (last 3)  Recent Labs    12/16/18 1647 12/16/18 2103 12/17/18 0759  GLUCAP 443* 333* 152*    CKDII -Creatinine at baseline  Obesity/OSA  -on cpap qhs, cannot use in the Covid hospital  Polysubstance abuse + cocaine  Depression/PTSD -Seen at [facility], was supposed to be started on Prozac however that medication was not called in and he was not given a prescription.  Started on Prozac here, no reported side effects this morning  Scheduled Meds: . aspirin EC  81 mg Oral Daily  . atorvastatin  40 mg Oral q1800  . carvedilol  3.125 mg Oral BID WC  . dexamethasone  6 mg Oral Q0600  . digoxin  0.125 mg Oral Daily  . enoxaparin (LOVENOX) injection  60 mg Subcutaneous Q24H  . FLUoxetine  20 mg Oral Daily  . insulin aspart  0-20 Units Subcutaneous TID WC  . insulin aspart  0-5 Units Subcutaneous QHS  . insulin aspart  10 Units Subcutaneous TID WC  . insulin detemir  25 Units Subcutaneous Daily  . multivitamin with minerals  1 tablet Oral Daily  . pantoprazole  40 mg Oral Daily  . sacubitril-valsartan  1 tablet Oral BID  . sodium chloride flush  3 mL Intravenous Q12H  . spironolactone  25 mg Oral Daily  . torsemide  40 mg Oral BID  . vitamin C  500 mg Oral Daily  . zinc sulfate  220 mg Oral Daily   Continuous Infusions: . sodium chloride    .  remdesivir 100 mg in NS 250 mL 100 mg (12/17/18 1028)   PRN Meds:.sodium chloride, acetaminophen, chlorpheniramine-HYDROcodone, diphenhydrAMINE, guaiFENesin-dextromethorphan, hydrocortisone cream, ondansetron **OR** ondansetron (ZOFRAN) IV, sodium chloride flush  DVT prophylaxis: Lovenox Code Status: Full code Family Communication: d/w patient  Disposition Plan: home when ready   Consultants:  None   Procedures:  None   Microbiology: None   Antimicrobials: None    Objective: Vitals:   12/17/18 0551 12/17/18 0553 12/17/18  0801 12/17/18 1014  BP:  95/65 98/68 91/62   Pulse:  75 93 97  Resp:  (!) 21 (!) 23   Temp: 97.9 F (36.6 C)  98 F (36.7 C)   TempSrc: Oral  Oral   SpO2:  100% 100%   Weight:      Height:        Intake/Output Summary (Last 24 hours) at 12/17/2018 1148 Last data filed at 12/17/2018 0900 Gross per 24 hour  Intake 1360 ml  Output 1000 ml  Net 360 ml   Filed Weights   12/14/18 0608  Weight: 127.1 kg    Examination:  Constitutional: NAD Eyes: No scleral icterus ENMT: Moist mucous membranes Respiratory: Lungs are clear to auscultation bilaterally, no wheezing, no crackles, good air movement Cardiovascular: Regular rate and rhythm, no significant murmurs appreciated.  No edema Abdomen: Soft, nondistended, positive bowel sounds Musculoskeletal: no clubbing / cyanosis.  Skin: No rashes seen Neurologic: Nonfocal, equal strength   Data Reviewed: I have independently reviewed following labs and imaging studies   CBC: Recent Labs  Lab 12/13/18 1751 12/14/18 0715 12/15/18 0405 12/16/18 0242  WBC 5.2 4.2 4.9 8.2  NEUTROABS 3.7  --  3.6 6.3  HGB 13.3 12.8* 12.8* 13.3  HCT 40.5 37.6* 38.2* 39.2  MCV 93.5 91.5 91.4 91.6  PLT 227 224 238 [id number]   Basic Metabolic Panel: Recent Labs  Lab 12/13/18 1751 12/14/18 0715 12/14/18 2315 12/15/18 0405 12/16/18 0242 12/17/18 0130  NA 136  --  135 136 135 136  137  K 3.9  --  4.2 3.3* 4.0 3.9  3.8  CL 99  --  100 102 100 103  103  CO2 26  --  23 23 27 25  25   GLUCOSE 206*  --  526* 345* 289* 224*  233*  BUN 12  --  28* 31* 30* 24*  24*  CREATININE 1.44* 1.28* 1.42* 1.29* 1.23 1.06  1.05  CALCIUM 9.8  --  9.6 9.3 9.2 9.0  9.0   GFR: Estimated Creatinine Clearance: 136.6 mL/min (by C-G formula based on SCr of 1.05 mg/dL). Liver Function Tests: Recent Labs  Lab 12/13/18 1751 12/15/18 0405 12/16/18 0242 12/17/18 0130  AST 35 22 19 15   ALT 45* 41 37 32  ALKPHOS 68 83 83 83  BILITOT 1.0 0.7 0.4 0.6  PROT 6.9 7.0 7.0  6.6  ALBUMIN 3.8 3.6 3.5 3.5   No results for input(s): LIPASE, AMYLASE in the last 168 hours. No results for input(s): AMMONIA in the last 168 hours. Coagulation Profile: No results for input(s): INR, PROTIME in the last 168 hours. Cardiac Enzymes: No results for input(s): CKTOTAL, CKMB, CKMBINDEX, TROPONINI in the last 168 hours. BNP (last 3 results) No results for input(s): PROBNP in the last 8760 hours. HbA1C: Recent Labs    12/14/18 2047  HGBA1C 9.7*   CBG: Recent Labs  Lab 12/16/18 0750 12/16/18 1147 12/16/18 1647 12/16/18 2103 12/17/18 0759  GLUCAP 273* 331* 443* 333* 152*   Lipid Profile: No results  for input(s): CHOL, HDL, LDLCALC, TRIG, CHOLHDL, LDLDIRECT in the last 72 hours. Thyroid Function Tests: No results for input(s): TSH, T4TOTAL, FREET4, T3FREE, THYROIDAB in the last 72 hours. Anemia Panel: No results for input(s): VITAMINB12, FOLATE, FERRITIN, TIBC, IRON, RETICCTPCT in the last 72 hours. Urine analysis:    Component Value Date/Time   COLORURINE YELLOW 08/28/2018 0045   APPEARANCEUR CLEAR 08/28/2018 0045   LABSPEC 1.015 08/28/2018 0045   PHURINE 6.0 08/28/2018 0045   GLUCOSEU >=500 (A) 08/28/2018 0045   HGBUR SMALL (A) 08/28/2018 0045   BILIRUBINUR NEGATIVE 08/28/2018 0045   KETONESUR NEGATIVE 08/28/2018 0045   PROTEINUR 30 (A) 08/28/2018 0045   NITRITE NEGATIVE 08/28/2018 0045   LEUKOCYTESUR NEGATIVE 08/28/2018 0045   Sepsis Labs: Invalid input(s): PROCALCITONIN, LACTICIDVEN  Recent Results (from the past 240 hour(s))  SARS Coronavirus 2 [facility] order, Performed in [facility] hospital lab) Nasopharyngeal Nasopharyngeal Swab     Status: Abnormal   Collection Time: 12/13/18  7:40 PM   Specimen: Nasopharyngeal Swab  Result Value Ref Range Status   SARS Coronavirus 2 POSITIVE (A) NEGATIVE Final    Comment: CRITICAL RESULT CALLED TO, READ BACK BY AND VERIFIED WITH: Marsa Aris RN @2028  12/13/2018 OLSONM (NOTE) If result is NEGATIVE  SARS-CoV-2 target nucleic acids are NOT DETECTED. The SARS-CoV-2 RNA is generally detectable in upper and lower  respiratory specimens during the acute phase of infection. The lowest  concentration of SARS-CoV-2 viral copies this assay can detect is 250  copies / mL. A negative result does not preclude SARS-CoV-2 infection  and should not be used as the sole basis for treatment or other  patient management decisions.  A negative result may occur with  improper specimen collection / handling, submission of specimen other  than nasopharyngeal swab, presence of viral mutation(s) within the  areas targeted by this assay, and inadequate number of viral copies  (<250 copies / mL). A negative result must be combined with clinical  observations, patient history, and epidemiological information. If result is POSITIVE SARS-CoV-2 target nucleic acids are  DETECTED. The SARS-CoV-2 RNA is generally detectable in upper and lower  respiratory specimens during the acute phase of infection.  Positive  results are indicative of active infection with SARS-CoV-2.  Clinical  correlation with patient history and other diagnostic information is  necessary to determine patient infection status.  Positive results do  not rule out bacterial infection or co-infection with other viruses. If result is PRESUMPTIVE POSTIVE SARS-CoV-2 nucleic acids MAY BE PRESENT.   A presumptive positive result was obtained on the submitted specimen  and confirmed on repeat testing.  While 2019 novel coronavirus  (SARS-CoV-2) nucleic acids may be present in the submitted sample  additional confirmatory testing may be necessary for epidemiological  and / or clinical management purposes  to differentiate between  SARS-CoV-2 and other Sarbecovirus currently known to infect humans.  If clinically indicated additional testing with an alternate test  methodology  [id number]) is advised. The SARS-CoV-2 RNA is generally  detectable in upper  and lower respiratory specimens during the acute  phase of infection. The expected result is Negative. Fact Sheet for Patients:  StrictlyIdeas.no Fact Sheet for Healthcare Providers: BankingDealers.co.za This test is not yet approved or cleared by the [place] FDA and has been authorized for detection and/or diagnosis of SARS-CoV-2 by FDA under an Emergency Use Authorization (EUA).  This EUA will remain in effect (meaning this test can be used) for the duration of the COVID-19 declaration under  Section 564(b)(1) of the Act, 21 U.S.C. section 360bbb-3(b)(1), unless the authorization is terminated or revoked sooner. Performed at [facility], [address] [place], [place] [address]   Culture, blood (Routine X 2) w Reflex to ID Panel     Status: None (Preliminary result)   Collection Time: 12/13/18  8:30 PM   Specimen: BLOOD RIGHT HAND  Result Value Ref Range Status   Specimen Description   Final    BLOOD RIGHT HAND Performed at [facility], [place] [address], [place], [place] [address]    Special Requests   Final    BOTTLES DRAWN AEROBIC ONLY Blood Culture results may not be optimal due to an inadequate volume of blood received in culture bottles Performed at [facility], [address] [place], [place] [address]    Culture   Final    NO GROWTH 4 DAYS Performed at [facility], [place] [address], [place], [place] [address]    Report Status PENDING  Incomplete  Culture, blood (Routine X 2) w Reflex to ID Panel     Status: None (Preliminary result)   Collection Time: 12/13/18  8:32 PM   Specimen: BLOOD  Result Value Ref Range Status   Specimen Description   Final    BLOOD RIGHT ANTECUBITAL Performed at [facility], [address] [place], [place] [address]    Special Requests   Final    BOTTLES DRAWN AEROBIC AND ANAEROBIC Blood Culture adequate volume Performed at [facility], [address] [place], [place] [address]    Culture   Final    NO GROWTH 4 DAYS Performed at [facility], [place] [address], [place], [place] [address]    Report Status PENDING  Incomplete      Radiology Studies: No results found.  Marzetta Board, MD, PhD [facility]  Contact via  [email or web address]  [place] P: [phone] F: [phone]

## 2018-12-17 NOTE — Progress Notes (Addendum)
13 beats of V tach at 1320 per Tele. Pt asymptomatic, resting in chair, VSS on 4L O2 [place]. 97/76 (82). Will continue to monitor. MD aware.

## 2018-12-17 NOTE — Progress Notes (Signed)
Patient's spouse called for update on plan of care. All questions answered.

## 2018-12-18 DIAGNOSIS — N182 Chronic kidney disease, stage 2 (mild): Secondary | ICD-10-CM

## 2018-12-18 DIAGNOSIS — I428 Other cardiomyopathies: Secondary | ICD-10-CM

## 2018-12-18 DIAGNOSIS — I5022 Chronic systolic (congestive) heart failure: Secondary | ICD-10-CM

## 2018-12-18 DIAGNOSIS — I509 Heart failure, unspecified: Secondary | ICD-10-CM

## 2018-12-18 LAB — COMPREHENSIVE METABOLIC PANEL
ALT: 32 U/L (ref 0–44)
AST: 23 U/L (ref 15–41)
Albumin: 3.6 g/dL (ref 3.5–5.0)
Alkaline Phosphatase: 82 U/L (ref 38–126)
Anion gap: 11 (ref 5–15)
BUN: 27 mg/dL — ABNORMAL HIGH (ref 6–20)
CO2: 25 mmol/L (ref 22–32)
Calcium: 8.8 mg/dL — ABNORMAL LOW (ref 8.9–10.3)
Chloride: 100 mmol/L (ref 98–111)
Creatinine, Ser: 1.09 mg/dL (ref 0.61–1.24)
GFR calc Af Amer: >60 mL/min (ref >60)
GFR calc non Af Amer: >60 mL/min (ref >60)
Glucose, Bld: 276 mg/dL — ABNORMAL HIGH (ref 70–99)
Potassium: 3.5 mmol/L (ref 3.5–5.1)
Sodium: 136 mmol/L (ref 135–145)
Total Bilirubin: 0.4 mg/dL (ref 0.3–1.2)
Total Protein: 6.8 g/dL (ref 6.5–8.1)

## 2018-12-18 LAB — D-DIMER, QUANTITATIVE: D-Dimer, Quant: 0.28 ug/mL-FEU (ref 0.00–0.50)

## 2018-12-18 LAB — CULTURE, BLOOD (ROUTINE X 2)
Culture: NO GROWTH
Culture: NO GROWTH
Special Requests: ADEQUATE

## 2018-12-18 LAB — C-REACTIVE PROTEIN: CRP: 1.2 mg/dL — ABNORMAL HIGH (ref <1.0)

## 2018-12-18 LAB — GLUCOSE, CAPILLARY: Glucose-Capillary: 227 mg/dL — ABNORMAL HIGH (ref 70–99)

## 2018-12-18 MED ORDER — DEXAMETHASONE 6 MG PO TABS: 6.0000 mg | ORAL_TABLET | Freq: Every day | ORAL | 0 refills | Status: AC

## 2018-12-18 MED ORDER — FLUOXETINE HCL 20 MG PO CAPS: 20.0000 mg | ORAL_CAPSULE | Freq: Every day | ORAL | 0 refills | Status: AC

## 2018-12-18 NOTE — Progress Notes (Signed)
IV removed with tip intact. Discharge instructions prescription and thermometer given to patient. Reviewed discharge instructions with patient including changes made to home medication list, how to follow quarantine instructions, mask wearing, cleaning common surfaces. Patient verbalized understanding. All questions answered. Patient's mother will transport home. Asked patient who he wanted called to review instructions with. Patient asked this writer not to call family with instructions and that he would update them. Patient has all belongings. Patient has court papers from MD.

## 2018-12-18 NOTE — Discharge Summary (Signed)
Physician Discharge Summary  Carlos Brewer. [id number] DOB: 1985/08/22 DOA: 12/13/2018  PCP: Patient, No Pcp Per  Admit date: 12/13/2018 Discharge date: 12/18/2018  Admitted From: home Disposition:  home  Recommendations for Outpatient Follow-up:  1. Follow up with PCP in 1-2 weeks 2. Please obtain BMP/CBC in one week 3. Continue Decadron for 5 additional days  Home Health: none Equipment/Devices: none  Discharge Condition: stable CODE STATUS: Full code Diet recommendation: low sodium  HPI: Per admitting MD, Carlos Brewer. is a 33 y.o. male with medical history significant of NICM, chronic systolic chf, osa comes in with several days of sob, cough and fever and traveling in from [place] by car tonight.  cta neg for pe but showed bilateral infiltrates covid positive.  Denies chest pain.  No le edema or swelling.  Hypoxic on arrival normalized on 2l iters.  Pt referred for admission for covid infection, given decadron already.  Hospital Course: Acute Hypoxic Respiratory Failure due to Covid-19 Viral Illness -patient was admitted to the hospital due to hypoxic respiratory failure in the setting of COVID-19 viral illness.  Patient is started on Remdesivir and has completed a 5-day course while hospitalized.  He was also started on Decadron for 10 total days, and he is to finish treatment as an outpatient with 5 remaining days.  His clinical condition improved, he was comfortable on room air without significant respiratory issues.  His Covid specific inflammatory labs are minimally elevated and stable  COVID-19 Labs  Recent Labs    12/16/18 0242 12/17/18 0130 12/18/18 0427  DDIMER 0.30 0.34 0.28  CRP 1.2* 0.8 1.2*    Active Problems Chronic systolic CHF -Most recent echo EF 10-15% earlier 2020.  Left heart cath in 2018 without significant coronary disease, thought to have nonischemic cardiomyopathy. Continue home regimen. Euvolemic on discharge. Normal BP  at home soft, in the 90s per patient. Type 2 diabetes mellitus -Recently diagnosed, continue home insulin regimen CKDII -Creatinine at baseline Obesity/OSA  -continue CPAP Cocaine use -UDS positive for cocaine, patient would benefit from cessation Depression/PTSD -Seen at [facility], was supposed to be started on Prozac however that medication was not called in and he was not given a prescription per patient.  Started on Prozac here, tolerated well, will provide a short prescription and he is to [facility] use  Discharge Diagnoses:  Principal Problem:   COVID-19 Active Problems:   Chronic systolic heart failure (HCC)   Nonischemic cardiomyopathy ([place])   CKD (chronic kidney disease), stage II   COVID-19 virus infection  Discharge Instructions  Allergies as of 12/18/2018   No Known Allergies     Medication List    STOP taking these medications   famotidine 20 MG tablet Commonly known as: Pepcid   HYDROcodone-acetaminophen 5-325 MG tablet Commonly known as: NORCO/VICODIN   sertraline 100 MG tablet Commonly known as: ZOLOFT   traZODone 50 MG tablet Commonly known as: DESYREL     TAKE these medications   acetaminophen 500 MG tablet Commonly known as: TYLENOL Take 2 tablets (1,000 mg total) by mouth every 6 (six) hours as needed.   albuterol 108 (90 Base) MCG/ACT inhaler Commonly known as: VENTOLIN HFA Inhale 1-2 puffs into the lungs every 6 (six) hours as needed for wheezing or shortness of breath.   albuterol 1.25 MG/3ML nebulizer solution Commonly known as: ACCUNEB Take 3 mLs by nebulization 4 (four) times daily as needed for wheezing or shortness of breath.  aspirin 81 MG EC tablet Take 1 tablet (81 mg total) by mouth daily.   atorvastatin 40 MG tablet Commonly known as: LIPITOR Take 1 tablet (40 mg total) by mouth daily at 6 PM.   bacitracin ointment Apply 1 application topically 2 (two) times daily.   carvedilol 3.125 MG tablet Commonly known  as: COREG Take 1 tablet (3.125 mg total) by mouth 2 (two) times daily with a meal.   dexamethasone 6 MG tablet Commonly known as: DECADRON Take 1 tablet (6 mg total) by mouth daily at 6 (six) AM for 5 days. Start taking on: December 19, 2018   digoxin 0.125 MG tablet Commonly known as: LANOXIN Take 1 tablet (0.125 mg total) by mouth daily.   FLUoxetine 20 MG capsule Commonly known as: PROZAC Take 1 capsule (20 mg total) by mouth daily. Start taking on: December 19, 2018   ibuprofen 200 MG tablet Commonly known as: ADVIL Take 600 mg by mouth every 6 (six) hours as needed for moderate pain.   insulin aspart 100 UNIT/ML injection Commonly known as: novoLOG Inject 10 Units into the skin 3 (three) times daily before meals.   Levemir 100 UNIT/ML injection Generic drug: insulin detemir Inject 40 Units into the skin daily.   metolazone 2.5 MG tablet Commonly known as: ZAROXOLYN Take 1 tablet (2.5 mg total) by mouth daily as needed for up to 30 days. Take metolazone 2.5mg  x1 only for 5pound weight gain, please take potassium 39meq x1 with metolazone.   Multi-Vitamin tablet Take 1 tablet by mouth daily.   pantoprazole 40 MG tablet Commonly known as: PROTONIX Take 40 mg by mouth daily.   potassium chloride SA 20 MEQ tablet Commonly known as: KLOR-CON Take 1 tablet (20 mEq total) by mouth daily. Can take extra one if taking metolazone.   sacubitril-valsartan 24-26 MG Commonly known as: ENTRESTO Take 1 tablet by mouth 2 (two) times daily.   spironolactone 25 MG tablet Commonly known as: ALDACTONE Take 1 tablet (25 mg total) by mouth daily.   torsemide 20 MG tablet Commonly known as: DEMADEX Take 2 tablets (40 mg total) by mouth 2 (two) times daily. What changed: when to take this      [facility] Follow up.   Why: Your are scheduled for a telephonic hospital followup appointment on 01/12/19 @ 9:30am, please be  available for this call. Should you have to reschedule please call them.  Contact information: [place] [phone]         Consultations:  None   Procedures/Studies:  Ct Angio Chest Pe W Or Wo Contrast  Result Date: 12/13/2018 CLINICAL DATA:  33 year old male with shortness of breath for 2 days. Abnormal D-dimer. EXAM: CT ANGIOGRAPHY CHEST WITH CONTRAST TECHNIQUE: Multidetector CT imaging of the chest was performed using the standard protocol during bolus administration of intravenous contrast. Multiplanar CT image reconstructions and MIPs were obtained to evaluate the vascular anatomy. CONTRAST:  144mL OMNIPAQUE IOHEXOL 350 MG/ML SOLN COMPARISON:  Portable chest the same day. Chest CTA 04/10/2018. FINDINGS: Cardiovascular: Good contrast bolus timing in the pulmonary arterial tree. No focal filling defect identified in the pulmonary arteries to suggest acute pulmonary embolism. Stable cardiomegaly. No pericardial effusion. No contrast in the aorta today. No aortic atherosclerosis identified. Mediastinum/Nodes: Prominent right paratracheal lymph nodes individually up to 14 millimeters short axis are stable since January but nonspecific. Smaller subcentimeter prevascular nodes are stable. More normal  appearing bilateral hilar nodes are also stable. No axillary or thoracic inlet lymphadenopathy. Lungs/Pleura: Major airways are patent. There is centrilobular ground-glass nodularity throughout the right upper lobe (series 5, image 37) with ventilation elsewhere appearing stable to that in January. Mild mosaic attenuation in both lower lobes again noted. Trace layering right pleural effusion. Trace left pleural fluid. Mild linear atelectasis or scarring along the left hilum. Upper Abdomen: Mild reflux of contrast into the hepatic IVC and hepatic veins. Questionable hepatic steatosis. Negative visible spleen, pancreas, adrenal glands, stomach and left  renal upper pole. Musculoskeletal: Left anterior 5th rib fracture appears partially healed on series 5, image 61 and was not apparent in June. No superimposed No acute osseous abnormality identified. Review of the MIP images confirms the above findings. IMPRESSION: 1. No evidence of acute pulmonary embolus. 2. Isolated right upper lobe centrilobular ground-glass nodularity is most suspicious for an acute respiratory infection. Differential considerations include hypersensitivity pneumonitis, pulmonary vasculitis. 3. Chronic cardiomegaly with trace layering right and trace left pleural effusions today. Mild reflux of contrast into the hepatic veins raising the possibility of right heart failure. 4. Nonspecific mediastinal lymphadenopathy is stable since January. This might be reactive in the setting of recurrent CHF (#3). 5. Partially healed left anterior 5th rib fracture. Electronically Signed   By: Genevie Ann M.D.   On: 12/13/2018 19:30   Dg Chest Port 1 View  Result Date: 12/13/2018 CLINICAL DATA:  33 year old male with shortness of breath for 2 days. Abnormal D-dimer. EXAM: PORTABLE CHEST 1 VIEW COMPARISON:  Chest CTA today reported separately. FINDINGS: Portable AP upright view at 1907 hours. Lower lung volumes compared to radiographs in April. Stable cardiomegaly and mediastinal contours. Visualized tracheal air column is within normal limits. Pulmonary vascularity appears mildly increased since April. Right upper lobe centrilobular nodularity and small pleural effusions were better demonstrated by CT today. IMPRESSION: 1. Mildly lower lung volumes and increased pulmonary vascularity compared to April radiographs. Stable cardiomegaly. 2. See also Chest CTA findings today reported separately. Electronically Signed   By: Genevie Ann M.D.   On: 12/13/2018 19:32     Subjective: - no chest pain, shortness of breath, no abdominal pain, nausea or vomiting.   Discharge Exam: BP 95/67 (BP Location: Left Arm)     Pulse 90    Temp 97.9 F (36.6 C) (Oral)    Resp 17    Ht 6' (1.829 m)    Wt 127.1 kg    SpO2 100%    BMI 38.00 kg/m   General: Pt is alert, awake, not in acute distress Cardiovascular: RRR, S1/S2 +, no rubs, no gallops Respiratory: CTA bilaterally, no wheezing, no rhonchi Abdominal: Soft, NT, ND, bowel sounds + Extremities: no edema, no cyanosis    The results of significant diagnostics from this hospitalization (including imaging, microbiology, ancillary and laboratory) are listed below for reference.     Microbiology: Recent Results (from the past 240 hour(s))  SARS Coronavirus 2 [facility] order, Performed in [facility] hospital lab) Nasopharyngeal Nasopharyngeal Swab     Status: Abnormal   Collection Time: 12/13/18  7:40 PM   Specimen: Nasopharyngeal Swab  Result Value Ref Range Status   SARS Coronavirus 2 POSITIVE (A) NEGATIVE Final    Comment: CRITICAL RESULT CALLED TO, READ BACK BY AND VERIFIED WITH: KELLIE NEAL RN @2028  12/13/2018 OLSONM (NOTE) If result is NEGATIVE SARS-CoV-2 target nucleic acids are NOT DETECTED. The SARS-CoV-2 RNA is generally detectable in upper and lower  respiratory specimens during the  acute phase of infection. The lowest  concentration of SARS-CoV-2 viral copies this assay can detect is 250  copies / mL. A negative result does not preclude SARS-CoV-2 infection  and should not be used as the sole basis for treatment or other  patient management decisions.  A negative result may occur with  improper specimen collection / handling, submission of specimen other  than nasopharyngeal swab, presence of viral mutation(s) within the  areas targeted by this assay, and inadequate number of viral copies  (<250 copies / mL). A negative result must be combined with clinical  observations, patient history, and epidemiological information. If result is POSITIVE SARS-CoV-2 target nucleic acids are  DETECTED. The SARS-CoV-2 RNA is generally detectable in upper  and lower  respiratory specimens during the acute phase of infection.  Positive  results are indicative of active infection with SARS-CoV-2.  Clinical  correlation with patient history and other diagnostic information is  necessary to determine patient infection status.  Positive results do  not rule out bacterial infection or co-infection with other viruses. If result is PRESUMPTIVE POSTIVE SARS-CoV-2 nucleic acids MAY BE PRESENT.   A presumptive positive result was obtained on the submitted specimen  and confirmed on repeat testing.  While 2019 novel coronavirus  (SARS-CoV-2) nucleic acids may be present in the submitted sample  additional confirmatory testing may be necessary for epidemiological  and / or clinical management purposes  to differentiate between  SARS-CoV-2 and other Sarbecovirus currently known to infect humans.  If clinically indicated additional testing with an alternate test  methodology  [phone]) is advised. The SARS-CoV-2 RNA is generally  detectable in upper and lower respiratory specimens during the acute  phase of infection. The expected result is Negative. Fact Sheet for Patients:  StrictlyIdeas.no Fact Sheet for Healthcare Providers: BankingDealers.co.za This test is not yet approved or cleared by the [place] FDA and has been authorized for detection and/or diagnosis of SARS-CoV-2 by FDA under an Emergency Use Authorization (EUA).  This EUA will remain in effect (meaning this test can be used) for the duration of the COVID-19 declaration under Section 564(b)(1) of the Act, 21 U.S.C. section 360bbb-3(b)(1), unless the authorization is terminated or revoked sooner. Performed at [facility], [address] [place], [place] [address]   Culture, blood (Routine X 2) w Reflex to ID Panel     Status: None (Preliminary result)   Collection Time: 12/13/18  8:30 PM   Specimen: BLOOD RIGHT HAND    Result Value Ref Range Status   Specimen Description   Final    BLOOD RIGHT HAND Performed at [facility], [place] [address] [place], [place] [address]    Special Requests   Final    BOTTLES DRAWN AEROBIC ONLY Blood Culture results may not be optimal due to an inadequate volume of blood received in culture bottles Performed at [facility], [address] [place], [place] [address]    Culture   Final    NO GROWTH 4 DAYS Performed at [facility], [place] [address], [place], [place] [address]    Report Status PENDING  Incomplete  Culture, blood (Routine X 2) w Reflex to ID Panel     Status: None (Preliminary result)   Collection Time: 12/13/18  8:32 PM   Specimen: BLOOD  Result Value Ref Range Status   Specimen Description   Final    BLOOD RIGHT ANTECUBITAL Performed at [facility],  [place], [place] [address]    Special Requests   Final    BOTTLES DRAWN AEROBIC AND ANAEROBIC Blood Culture adequate volume Performed at [facility], [address] [place], [place] [address]    Culture   Final    NO GROWTH 4 DAYS Performed at [facility], [place] [address], [place], [place] [address]    Report Status PENDING  Incomplete     Labs: BNP (last 3 results) Recent Labs    07/12/18 1601 12/13/18 1750 12/15/18 0405  BNP 868.7* 1,156.1* [id number]*   Basic Metabolic Panel: Recent Labs  Lab 12/14/18 2315 12/15/18 0405 12/16/18 0242 12/17/18 0130 12/18/18 0427  NA 135 136 135 136   137 136  K 4.2 3.3* 4.0 3.9   3.8 3.5  CL 100 102 100 103   103 100  CO2 23 23 27 25   25 25   GLUCOSE 526* 345* 289* 224*   233* 276*  BUN 28* 31* 30* 24*   24* 27*  CREATININE 1.42* 1.29* 1.23 1.06   1.05 1.09  CALCIUM 9.6 9.3 9.2 9.0   9.0 8.8*   Liver Function Tests: Recent Labs  Lab 12/13/18 1751 12/15/18 0405 12/16/18 0242 12/17/18 0130 12/18/18 0427  AST 35 22 19 15 23   ALT 45* 41 37 32 32  ALKPHOS 68 83 83 83 82   BILITOT 1.0 0.7 0.4 0.6 0.4  PROT 6.9 7.0 7.0 6.6 6.8  ALBUMIN 3.8 3.6 3.5 3.5 3.6   No results for input(s): LIPASE, AMYLASE in the last 168 hours. No results for input(s): AMMONIA in the last 168 hours. CBC: Recent Labs  Lab 12/13/18 1751 12/14/18 0715 12/15/18 0405 12/16/18 0242  WBC 5.2 4.2 4.9 8.2  NEUTROABS 3.7  --  3.6 6.3  HGB 13.3 12.8* 12.8* 13.3  HCT 40.5 37.6* 38.2* 39.2  MCV 93.5 91.5 91.4 91.6  PLT 227 224 238 268   Cardiac Enzymes: No results for input(s): CKTOTAL, CKMB, CKMBINDEX, TROPONINI in the last 168 hours. BNP: Invalid input(s): POCBNP CBG: Recent Labs  Lab 12/17/18 0759 12/17/18 1150 12/17/18 1701 12/17/18 2113 12/18/18 0822  GLUCAP 152* 303* 340* 318* 227*   D-Dimer Recent Labs    12/17/18 0130 12/18/18 0427  DDIMER 0.34 0.28   Hgb A1c No results for input(s): HGBA1C in the last 72 hours. Lipid Profile No results for input(s): CHOL, HDL, LDLCALC, TRIG, CHOLHDL, LDLDIRECT in the last 72 hours. Thyroid function studies No results for input(s): TSH, T4TOTAL, T3FREE, THYROIDAB in the last 72 hours.  Invalid input(s): FREET3 Anemia work up No results for input(s): VITAMINB12, FOLATE, FERRITIN, TIBC, IRON, RETICCTPCT in the last 72 hours. Urinalysis    Component Value Date/Time   COLORURINE YELLOW 08/28/2018 0045   APPEARANCEUR CLEAR 08/28/2018 0045   LABSPEC 1.015 08/28/2018 0045   PHURINE 6.0 08/28/2018 0045   GLUCOSEU >=500 (A) 08/28/2018 0045   HGBUR SMALL (A) 08/28/2018 0045   BILIRUBINUR NEGATIVE 08/28/2018 0045   KETONESUR NEGATIVE 08/28/2018 0045   PROTEINUR 30 (A) 08/28/2018 0045   NITRITE NEGATIVE 08/28/2018 0045   LEUKOCYTESUR NEGATIVE 08/28/2018 0045   Sepsis Labs Invalid input(s): PROCALCITONIN,  WBC,  LACTICIDVEN  FURTHER DISCHARGE INSTRUCTIONS:   Get Medicines reviewed and adjusted: Please take all your medications with you for your next visit with your Primary MD   Laboratory/radiological data: Please  request your Primary MD to go over all hospital tests and procedure/radiological results at the follow  up, please ask your Primary MD to get all Hospital records sent to his/her office.   In some cases, they will be blood work, cultures and biopsy results pending at the time of your discharge. Please request that your primary care M.D. goes through all the records of your hospital data and follows up on these results.   Also Note the following: If you experience worsening of your admission symptoms, develop shortness of breath, life threatening emergency, suicidal or homicidal thoughts you must seek medical attention immediately by calling 911 or calling your MD immediately  if symptoms less severe.   You must read complete instructions/literature along with all the possible adverse reactions/side effects for all the Medicines you take and that have been prescribed to you. Take any new Medicines after you have completely understood and accpet all the possible adverse reactions/side effects.    Do not drive when taking Pain medications or sleeping medications (Benzodaizepines)   Do not take more than prescribed Pain, Sleep and Anxiety Medications. It is not advisable to combine anxiety,sleep and pain medications without talking with your primary care practitioner   Special Instructions: If you have smoked or chewed Tobacco  in the last 2 yrs please stop smoking, stop any regular Alcohol  and or any Recreational drug use.   Wear Seat belts while driving.   Please note: You were cared for by a hospitalist during your hospital stay. Once you are discharged, your primary care physician will handle any further medical issues. Please note that NO REFILLS for any discharge medications will be authorized once you are discharged, as it is imperative that you return to your primary care physician (or establish a relationship with a primary care physician if you do not have one) for your post hospital  discharge needs so that they can reassess your need for medications and monitor your lab values.  Time coordinating discharge: 35 minutes  SIGNED:  Marzetta Board, MD, PhD 12/18/2018, 10:27 AM

## 2018-12-18 NOTE — Progress Notes (Signed)
Patient had 11 beats of Vtach. Patient asymptomatic sitting up in chair. MD notified. No further orders obtained.

## 2018-12-18 NOTE — Care Management (Signed)
CM spoke with pt regarding discharge planning.  Pt informed CM that he has insurance benefits via [place] - pt can not be matched.  CM provided pt information on Good rx and prescription cost with coupons.  Pt informed CM that he can pay out of pocket for discharge medications.  [facility] and [facility] prescriptions.

## 2018-12-18 NOTE — Discharge Instructions (Signed)
Follow with PCP in 5-7 days  Please get a complete blood count and chemistry panel checked by your Primary MD at your next visit, and again as instructed by your Primary MD. Please get your medications reviewed and adjusted by your Primary MD.  Please request your Primary MD to go over all Hospital Tests and Procedure/Radiological results at the follow up, please get all Hospital records sent to your Prim MD by signing hospital release before you go home.  In some cases, there will be blood work, cultures and biopsy results pending at the time of your discharge. Please request that your primary care M.D. goes through all the records of your hospital data and follows up on these results.  If you had Pneumonia of Lung problems at the Hospital: Please get a 2 view Chest X ray done in 6-8 weeks after hospital discharge or sooner if instructed by your Primary MD.  If you have Congestive Heart Failure: Please call your Cardiologist or Primary MD anytime you have any of the following symptoms:  1) 3 pound weight gain in 24 hours or 5 pounds in 1 week  2) shortness of breath, with or without a dry hacking cough  3) swelling in the hands, feet or stomach  4) if you have to sleep on extra pillows at night in order to breathe  Follow cardiac low salt diet and 1.5 lit/day fluid restriction.  If you have diabetes Accuchecks 4 times/day, Once in AM empty stomach and then before each meal. Log in all results and show them to your primary doctor at your next visit. If any glucose reading is under 80 or above 300 call your primary MD immediately.  If you have Seizure/Convulsions/Epilepsy: Please do not drive, operate heavy machinery, participate in activities at heights or participate in high speed sports until you have seen by Primary MD or a Neurologist and advised to do so again. Per [employer] [facility] statutes, patients with seizures are not allowed to drive until they have been seizure-free for six  months.  Use caution when using heavy equipment or power tools. Avoid working on ladders or at heights. Take showers instead of baths. Ensure the water temperature is not too high on the home water heater. Do not go swimming alone. Do not lock yourself in a room alone (i.e. bathroom). When caring for infants or small children, sit down when holding, feeding, or changing them to minimize risk of injury to the child in the event you have a seizure. Maintain good sleep hygiene. Avoid alcohol.   If you had Gastrointestinal Bleeding: Please ask your Primary MD to check a complete blood count within one week of discharge or at your next visit. Your endoscopic/colonoscopic biopsies that are pending at the time of discharge, will also need to followed by your Primary MD.  Get Medicines reviewed and adjusted. Please take all your medications with you for your next visit with your Primary MD  Please request your Primary MD to go over all hospital tests and procedure/radiological results at the follow up, please ask your Primary MD to get all Hospital records sent to his/her office.  If you experience worsening of your admission symptoms, develop shortness of breath, life threatening emergency, suicidal or homicidal thoughts you must seek medical attention immediately by calling 911 or calling your MD immediately  if symptoms less severe.  You must read complete instructions/literature along with all the possible adverse reactions/side effects for all the Medicines you take and that  have been prescribed to you. Take any new Medicines after you have completely understood and accpet all the possible adverse reactions/side effects.   Do not drive or operate heavy machinery when taking Pain medications.   Do not take more than prescribed Pain, Sleep and Anxiety Medications  Special Instructions: If you have smoked or chewed Tobacco  in the last 2 yrs please stop smoking, stop any regular Alcohol  and or any  Recreational drug use.  Wear Seat belts while driving.  Please note You were cared for by a hospitalist during your hospital stay. If you have any questions about your discharge medications or the care you received while you were in the hospital after you are discharged, you can call the unit and asked to speak with the hospitalist on call if the hospitalist that took care of you is not available. Once you are discharged, your primary care physician will handle any further medical issues. Please note that NO REFILLS for any discharge medications will be authorized once you are discharged, as it is imperative that you return to your primary care physician (or establish a relationship with a primary care physician if you do not have one) for your aftercare needs so that they can reassess your need for medications and monitor your lab values.  You can reach the hospitalist office at phone [phone] or fax [phone]   If you do not have a primary care physician, you can call [phone] for a physician referral.  Activity: As tolerated with Full fall precautions use walker/cane & assistance as needed    Diet: low sodium  Disposition Home    People who have recovered from the coronavirus, can help a person still fighting the virus and potentially help them recover by giving them convalescent plasma.  What is COVID-19 convalescent plasma? Convalescent plasma (CPP) is plasma collected from people who have recovered from the coronavirus. People who recover from coronavirus infection have developed antibodies to the virus that remain in the plasma portion of their blood. Transfusing the plasma that contains the antibodies into a person still fighting the virus can provide a boost to the patients immune system and potentially help them recover. The experimental treatment is approved by the FDA to be used on an emergency basis and is called COVID-19 convalescent plasma." Critically ill patients who  meet the FDA criteria to receive this therapy can be treated for life-threatening COVID-19.  Can I donate convalescent plasma or blood after being diagnosed with COVID-19? Donors must meet all the required screening criteria for blood donation and the additional FDA criteria, as follows:   Prior diagnosis of COVID-19 documented by an FDA approved laboratory test   A positive diagnostic test at the time of illness OR   A positive serological test for SARS-CoV-2 antibodies after recovery  Complete resolution of symptoms at least 14 days prior to donation and a documented negative COVID-19 FDA approved test OR   Complete resolution of symptoms at least 28 days prior to donation As a part of your pre-donation process you will be required to provide your COVID-19 test result(s).  Where can I donate COVID-19 convalescent plasma? You can complete the pre-donation form at [email or web address] and a donor specialist will be in contact within 24-48 hours.  What is the process for donating COVID-19 Convalescent Plasma?  1. The first step in the donation process is to obtain a copy of your test results or a letter from the testing facility notifying you of your positive  result and the date it was taken 2. Complete the pre-donation form at [email or web address] 3. A representative will be in contact within 24-48 hours to provide additional insight into the process 4. Once the process begins, [facility] will ensure you   Meet the required screening criteria for blood donation   Have the required test results   Meet the criteria for resolution of your symptoms   Complete resolution of symptoms at least 14 days prior to donation and a documented negative COVID-19 FDA approved test OR   Complete resolution of symptoms at least 28 days prior to donation 5. [facility] will then schedule a collection date and time to collect your COVID-19 convalescent plasma  Is the COVID-19 convalescent plasma donation  safe? It is safe to donate COVID-19 convalescent plasma and done in the same way that thousands of blood donors donate blood products every day.  My family member or friend is being treated for COVID-19. Can I donate to help them if I am eligible? A directed donation will need to be arranged in conjunction with your family members care provider.  The care provider will determine whether COVID-19 convalescent plasma is the best course of treatment   A directed donation must be arranged through the Clinician Referral page that can be found at [email or web address]   The clinician will need to provide [facility] with information about both you and the patient for which the donation is directed   Please note there are additional qualifications that are required such as your blood type and the patients blood type We encourage you to consider beginning the pre-registration process even if your loved one will not be receiving COVID-19 convalescent plasma as a part of their medical treatment. The need for COVID-19 convalescent plasma increases daily and your donated COVID-19 convalescent plasma could be life-saving for another COVID-19 patient.  Please go to the website [email or web address] if you would like to consider volunteer for plasma donation.       Person Under Monitoring Name: Kendan Yokoyama.  Location: [address] [place] [address]   Infection Prevention Recommendations for Individuals Confirmed to have, or Being Evaluated for, 2019 Novel Coronavirus (COVID-19) Infection Who Receive Care at Home  Individuals who are confirmed to have, or are being evaluated for, COVID-19 should follow the prevention steps below until a healthcare provider or local or state health department says they can return to normal activities.  Stay home except to get medical care You should restrict activities outside your home, except for getting medical care. Do not go to work, school,  or public areas, and do not use public transportation or taxis.  Call ahead before visiting your doctor Before your medical appointment, call the healthcare provider and tell them that you have, or are being evaluated for, COVID-19 infection. This will help the healthcare providers office take steps to keep other people from getting infected. Ask your healthcare provider to call the local or state health department.  Monitor your symptoms Seek prompt medical attention if your illness is worsening (e.g., difficulty breathing). Before going to your medical appointment, call the healthcare provider and tell them that you have, or are being evaluated for, COVID-19 infection. Ask your healthcare provider to call the local or state health department.  Wear a facemask You should wear a facemask that covers your nose and mouth when you are in the same room with other people and when you visit a healthcare provider. People who live with or visit you should  also wear a facemask while they are in the same room with you.  Separate yourself from other people in your home As much as possible, you should stay in a different room from other people in your home. Also, you should use a separate bathroom, if available.  Avoid sharing household items You should not share dishes, drinking glasses, cups, eating utensils, towels, bedding, or other items with other people in your home. After using these items, you should wash them thoroughly with soap and water.  Cover your coughs and sneezes Cover your mouth and nose with a tissue when you cough or sneeze, or you can cough or sneeze into your sleeve. Throw used tissues in a lined trash can, and immediately wash your hands with soap and water for at least 20 seconds or use an alcohol-based hand rub.  Wash your [facility] your hands often and thoroughly with soap and water for at least 20 seconds. You can use an alcohol-based hand sanitizer if soap and  water are not available and if your hands are not visibly dirty. Avoid touching your eyes, nose, and mouth with unwashed hands.   Prevention Steps for Caregivers and Household Members of Individuals Confirmed to have, or Being Evaluated for, COVID-19 Infection Being Cared for in the Home  If you live with, or provide care at home for, a person confirmed to have, or being evaluated for, COVID-19 infection please follow these guidelines to prevent infection:  Follow healthcare providers instructions Make sure that you understand and can help the patient follow any healthcare provider instructions for all care.  Provide for the patients basic needs You should help the patient with basic needs in the home and provide support for getting groceries, prescriptions, and other personal needs.  Monitor the patients symptoms If they are getting sicker, call his or her medical provider and tell them that the patient has, or is being evaluated for, COVID-19 infection. This will help the healthcare providers office take steps to keep other people from getting infected. Ask the healthcare provider to call the local or state health department.  Limit the number of people who have contact with the patient  If possible, have only one caregiver for the patient.  Other household members should stay in another home or place of residence. If this is not possible, they should stay  in another room, or be separated from the patient as much as possible. Use a separate bathroom, if available.  Restrict visitors who do not have an essential need to be in the home.  Keep older adults, very young children, and other sick people away from the patient Keep older adults, very young children, and those who have compromised immune systems or chronic health conditions away from the patient. This includes people with chronic heart, lung, or kidney conditions, diabetes, and cancer.  Ensure good ventilation Make  sure that shared spaces in the home have good air flow, such as from an air conditioner or an opened window, weather permitting.  Wash your hands often  Wash your hands often and thoroughly with soap and water for at least 20 seconds. You can use an alcohol based hand sanitizer if soap and water are not available and if your hands are not visibly dirty.  Avoid touching your eyes, nose, and mouth with unwashed hands.  Use disposable paper towels to dry your hands. If not available, use dedicated cloth towels and replace them when they become wet.  Wear a facemask and gloves  Wear a disposable facemask at all times in the room and gloves when you touch or have contact with the patients blood, body fluids, and/or secretions or excretions, such as sweat, saliva, sputum, nasal mucus, vomit, urine, or feces.  Ensure the mask fits over your nose and mouth tightly, and do not touch it during use.  Throw out disposable facemasks and gloves after using them. Do not reuse.  Wash your hands immediately after removing your facemask and gloves.  If your personal clothing becomes contaminated, carefully remove clothing and launder. Wash your hands after handling contaminated clothing.  Place all used disposable facemasks, gloves, and other waste in a lined container before disposing them with other household waste.  Remove gloves and wash your hands immediately after handling these items.  Do not share dishes, glasses, or other household items with the patient  Avoid sharing household items. You should not share dishes, drinking glasses, cups, eating utensils, towels, bedding, or other items with a patient who is confirmed to have, or being evaluated for, COVID-19 infection.  After the person uses these items, you should wash them thoroughly with soap and water.  Wash laundry thoroughly  Immediately remove and wash clothes or bedding that have blood, body fluids, and/or secretions or excretions,  such as sweat, saliva, sputum, nasal mucus, vomit, urine, or feces, on them.  Wear gloves when handling laundry from the patient.  Read and follow directions on labels of laundry or clothing items and detergent. In general, wash and dry with the warmest temperatures recommended on the label.  Clean all areas the individual has used often  Clean all touchable surfaces, such as counters, tabletops, doorknobs, bathroom fixtures, toilets, phones, keyboards, tablets, and bedside tables, every day. Also, clean any surfaces that may have blood, body fluids, and/or secretions or excretions on them.  Wear gloves when cleaning surfaces the patient has come in contact with.  Use a diluted bleach solution (e.g., dilute bleach with 1 part bleach and 10 parts water) or a household disinfectant with a label that says EPA-registered for coronaviruses. To make a bleach solution at home, add 1 tablespoon of bleach to 1 quart (4 cups) of water. For a larger supply, add  cup of bleach to 1 gallon (16 cups) of water.  Read labels of cleaning products and follow recommendations provided on product labels. Labels contain instructions for safe and effective use of the cleaning product including precautions you should take when applying the product, such as wearing gloves or eye protection and making sure you have good ventilation during use of the product.  Remove gloves and wash hands immediately after cleaning.  Monitor yourself for signs and symptoms of illness Caregivers and household members are considered close contacts, should monitor their health, and will be asked to limit movement outside of the home to the extent possible. Follow the monitoring steps for close contacts listed on the symptom monitoring form.   ? If you have additional questions, contact your local health department or call the epidemiologist on call at [phone] (available 24/7). ? This guidance is subject to change. For the most  up-to-date guidance from [facility], please refer to their website: [email or web address]

## 2018-12-29 ENCOUNTER — Other Ambulatory Visit: Payer: Self-pay

## 2018-12-29 DIAGNOSIS — Z20822 Contact with and (suspected) exposure to covid-19: Secondary | ICD-10-CM

## 2018-12-30 LAB — NOVEL CORONAVIRUS, NAA: SARS-CoV-2, NAA: NOT DETECTED

## 2019-01-03 ENCOUNTER — Other Ambulatory Visit: Payer: Self-pay

## 2019-01-03 DIAGNOSIS — Z20822 Contact with and (suspected) exposure to covid-19: Secondary | ICD-10-CM

## 2019-01-05 LAB — NOVEL CORONAVIRUS, NAA: SARS-CoV-2, NAA: NOT DETECTED

## 2019-01-12 ENCOUNTER — Other Ambulatory Visit: Payer: Self-pay

## 2019-01-12 ENCOUNTER — Encounter (INDEPENDENT_AMBULATORY_CARE_PROVIDER_SITE_OTHER): Payer: Self-pay | Admitting: Primary Care

## 2019-01-12 NOTE — Progress Notes (Signed)
No longer having symptoms 

## 2019-03-14 ENCOUNTER — Ambulatory Visit: Payer: Non-veteran care | Attending: Internal Medicine

## 2019-03-14 DIAGNOSIS — Z20822 Contact with and (suspected) exposure to covid-19: Secondary | ICD-10-CM

## 2019-03-16 LAB — NOVEL CORONAVIRUS, NAA: SARS-CoV-2, NAA: NOT DETECTED

## 2019-03-21 ENCOUNTER — Ambulatory Visit: Payer: Non-veteran care | Attending: Internal Medicine

## 2019-03-21 DIAGNOSIS — Z20822 Contact with and (suspected) exposure to covid-19: Secondary | ICD-10-CM

## 2019-03-23 LAB — NOVEL CORONAVIRUS, NAA: SARS-CoV-2, NAA: NOT DETECTED

## 2019-03-28 ENCOUNTER — Ambulatory Visit: Payer: Non-veteran care | Attending: Internal Medicine

## 2019-04-04 ENCOUNTER — Ambulatory Visit: Payer: Non-veteran care | Attending: Internal Medicine

## 2019-04-05 ENCOUNTER — Ambulatory Visit: Payer: Non-veteran care | Attending: Plastic Surgery

## 2019-04-05 DIAGNOSIS — Z20822 Contact with and (suspected) exposure to covid-19: Secondary | ICD-10-CM

## 2019-04-06 LAB — NOVEL CORONAVIRUS, NAA: SARS-CoV-2, NAA: NOT DETECTED

## 2019-04-12 ENCOUNTER — Ambulatory Visit: Payer: Non-veteran care | Attending: Internal Medicine

## 2019-04-12 DIAGNOSIS — Z20822 Contact with and (suspected) exposure to covid-19: Secondary | ICD-10-CM

## 2019-04-13 LAB — NOVEL CORONAVIRUS, NAA: SARS-CoV-2, NAA: NOT DETECTED

## 2019-04-22 ENCOUNTER — Encounter (HOSPITAL_COMMUNITY): Payer: Self-pay | Admitting: Radiology

## 2019-04-22 ENCOUNTER — Inpatient Hospital Stay (HOSPITAL_COMMUNITY): Payer: No Typology Code available for payment source

## 2019-04-22 ENCOUNTER — Emergency Department (HOSPITAL_COMMUNITY): Payer: No Typology Code available for payment source

## 2019-04-22 ENCOUNTER — Encounter (HOSPITAL_COMMUNITY): Admission: EM | Disposition: E | Payer: Self-pay | Source: Home / Self Care | Attending: Pulmonary Disease

## 2019-04-22 ENCOUNTER — Other Ambulatory Visit: Payer: Self-pay

## 2019-04-22 ENCOUNTER — Emergency Department (HOSPITAL_COMMUNITY): Payer: No Typology Code available for payment source | Admitting: Registered Nurse

## 2019-04-22 ENCOUNTER — Inpatient Hospital Stay (HOSPITAL_COMMUNITY)
Admission: EM | Admit: 2019-04-22 | Discharge: 2019-07-16 | DRG: 003 | Disposition: E | Payer: No Typology Code available for payment source | Attending: Internal Medicine | Admitting: Internal Medicine

## 2019-04-22 DIAGNOSIS — I509 Heart failure, unspecified: Secondary | ICD-10-CM | POA: Diagnosis not present

## 2019-04-22 DIAGNOSIS — R6521 Severe sepsis with septic shock: Secondary | ICD-10-CM | POA: Diagnosis not present

## 2019-04-22 DIAGNOSIS — I639 Cerebral infarction, unspecified: Secondary | ICD-10-CM

## 2019-04-22 DIAGNOSIS — I428 Other cardiomyopathies: Secondary | ICD-10-CM | POA: Diagnosis present

## 2019-04-22 DIAGNOSIS — I63312 Cerebral infarction due to thrombosis of left middle cerebral artery: Secondary | ICD-10-CM | POA: Diagnosis not present

## 2019-04-22 DIAGNOSIS — I67848 Other cerebrovascular vasospasm and vasoconstriction: Secondary | ICD-10-CM | POA: Diagnosis not present

## 2019-04-22 DIAGNOSIS — Z9989 Dependence on other enabling machines and devices: Secondary | ICD-10-CM

## 2019-04-22 DIAGNOSIS — T383X5A Adverse effect of insulin and oral hypoglycemic [antidiabetic] drugs, initial encounter: Secondary | ICD-10-CM | POA: Diagnosis not present

## 2019-04-22 DIAGNOSIS — A419 Sepsis, unspecified organism: Secondary | ICD-10-CM | POA: Diagnosis not present

## 2019-04-22 DIAGNOSIS — R042 Hemoptysis: Secondary | ICD-10-CM | POA: Diagnosis not present

## 2019-04-22 DIAGNOSIS — J398 Other specified diseases of upper respiratory tract: Secondary | ICD-10-CM

## 2019-04-22 DIAGNOSIS — R001 Bradycardia, unspecified: Secondary | ICD-10-CM | POA: Diagnosis present

## 2019-04-22 DIAGNOSIS — R197 Diarrhea, unspecified: Secondary | ICD-10-CM | POA: Diagnosis not present

## 2019-04-22 DIAGNOSIS — E870 Hyperosmolality and hypernatremia: Secondary | ICD-10-CM | POA: Diagnosis not present

## 2019-04-22 DIAGNOSIS — I4891 Unspecified atrial fibrillation: Secondary | ICD-10-CM | POA: Diagnosis not present

## 2019-04-22 DIAGNOSIS — B999 Unspecified infectious disease: Secondary | ICD-10-CM

## 2019-04-22 DIAGNOSIS — J982 Interstitial emphysema: Secondary | ICD-10-CM

## 2019-04-22 DIAGNOSIS — N390 Urinary tract infection, site not specified: Secondary | ICD-10-CM | POA: Diagnosis present

## 2019-04-22 DIAGNOSIS — R34 Anuria and oliguria: Secondary | ICD-10-CM | POA: Diagnosis not present

## 2019-04-22 DIAGNOSIS — G9349 Other encephalopathy: Secondary | ICD-10-CM | POA: Diagnosis not present

## 2019-04-22 DIAGNOSIS — Z794 Long term (current) use of insulin: Secondary | ICD-10-CM

## 2019-04-22 DIAGNOSIS — F17210 Nicotine dependence, cigarettes, uncomplicated: Secondary | ICD-10-CM | POA: Diagnosis present

## 2019-04-22 DIAGNOSIS — I513 Intracardiac thrombosis, not elsewhere classified: Secondary | ICD-10-CM | POA: Diagnosis present

## 2019-04-22 DIAGNOSIS — Z20822 Contact with and (suspected) exposure to covid-19: Secondary | ICD-10-CM | POA: Diagnosis present

## 2019-04-22 DIAGNOSIS — R310 Gross hematuria: Secondary | ICD-10-CM | POA: Diagnosis not present

## 2019-04-22 DIAGNOSIS — Z430 Encounter for attention to tracheostomy: Secondary | ICD-10-CM

## 2019-04-22 DIAGNOSIS — R7401 Elevation of levels of liver transaminase levels: Secondary | ICD-10-CM

## 2019-04-22 DIAGNOSIS — I498 Other specified cardiac arrhythmias: Secondary | ICD-10-CM | POA: Diagnosis not present

## 2019-04-22 DIAGNOSIS — J9621 Acute and chronic respiratory failure with hypoxia: Secondary | ICD-10-CM | POA: Diagnosis present

## 2019-04-22 DIAGNOSIS — J9500 Unspecified tracheostomy complication: Secondary | ICD-10-CM

## 2019-04-22 DIAGNOSIS — R1112 Projectile vomiting: Secondary | ICD-10-CM | POA: Diagnosis not present

## 2019-04-22 DIAGNOSIS — K7200 Acute and subacute hepatic failure without coma: Secondary | ICD-10-CM | POA: Diagnosis not present

## 2019-04-22 DIAGNOSIS — I472 Ventricular tachycardia: Secondary | ICD-10-CM | POA: Diagnosis not present

## 2019-04-22 DIAGNOSIS — I6602 Occlusion and stenosis of left middle cerebral artery: Secondary | ICD-10-CM

## 2019-04-22 DIAGNOSIS — L89626 Pressure-induced deep tissue damage of left heel: Secondary | ICD-10-CM | POA: Diagnosis not present

## 2019-04-22 DIAGNOSIS — I63412 Cerebral infarction due to embolism of left middle cerebral artery: Principal | ICD-10-CM | POA: Diagnosis present

## 2019-04-22 DIAGNOSIS — H5500 Unspecified nystagmus: Secondary | ICD-10-CM | POA: Diagnosis not present

## 2019-04-22 DIAGNOSIS — D7582 Heparin induced thrombocytopenia (HIT): Secondary | ICD-10-CM | POA: Diagnosis present

## 2019-04-22 DIAGNOSIS — D682 Hereditary deficiency of other clotting factors: Secondary | ICD-10-CM | POA: Diagnosis present

## 2019-04-22 DIAGNOSIS — Z930 Tracheostomy status: Secondary | ICD-10-CM

## 2019-04-22 DIAGNOSIS — R410 Disorientation, unspecified: Secondary | ICD-10-CM | POA: Diagnosis not present

## 2019-04-22 DIAGNOSIS — R509 Fever, unspecified: Secondary | ICD-10-CM

## 2019-04-22 DIAGNOSIS — J9600 Acute respiratory failure, unspecified whether with hypoxia or hypercapnia: Secondary | ICD-10-CM | POA: Diagnosis not present

## 2019-04-22 DIAGNOSIS — J9622 Acute and chronic respiratory failure with hypercapnia: Secondary | ICD-10-CM | POA: Diagnosis not present

## 2019-04-22 DIAGNOSIS — Z431 Encounter for attention to gastrostomy: Secondary | ICD-10-CM

## 2019-04-22 DIAGNOSIS — D696 Thrombocytopenia, unspecified: Secondary | ICD-10-CM | POA: Diagnosis not present

## 2019-04-22 DIAGNOSIS — Z8616 Personal history of COVID-19: Secondary | ICD-10-CM

## 2019-04-22 DIAGNOSIS — Z781 Physical restraint status: Secondary | ICD-10-CM

## 2019-04-22 DIAGNOSIS — J939 Pneumothorax, unspecified: Secondary | ICD-10-CM | POA: Diagnosis not present

## 2019-04-22 DIAGNOSIS — F1490 Cocaine use, unspecified, uncomplicated: Secondary | ICD-10-CM | POA: Diagnosis present

## 2019-04-22 DIAGNOSIS — R570 Cardiogenic shock: Secondary | ICD-10-CM | POA: Diagnosis not present

## 2019-04-22 DIAGNOSIS — E1122 Type 2 diabetes mellitus with diabetic chronic kidney disease: Secondary | ICD-10-CM | POA: Diagnosis present

## 2019-04-22 DIAGNOSIS — J988 Other specified respiratory disorders: Secondary | ICD-10-CM | POA: Diagnosis not present

## 2019-04-22 DIAGNOSIS — R579 Shock, unspecified: Secondary | ICD-10-CM | POA: Diagnosis present

## 2019-04-22 DIAGNOSIS — G9340 Encephalopathy, unspecified: Secondary | ICD-10-CM | POA: Diagnosis not present

## 2019-04-22 DIAGNOSIS — Z716 Tobacco abuse counseling: Secondary | ICD-10-CM

## 2019-04-22 DIAGNOSIS — R4701 Aphasia: Secondary | ICD-10-CM | POA: Diagnosis present

## 2019-04-22 DIAGNOSIS — R9431 Abnormal electrocardiogram [ECG] [EKG]: Secondary | ICD-10-CM | POA: Diagnosis not present

## 2019-04-22 DIAGNOSIS — Z7289 Other problems related to lifestyle: Secondary | ICD-10-CM

## 2019-04-22 DIAGNOSIS — F17200 Nicotine dependence, unspecified, uncomplicated: Secondary | ICD-10-CM | POA: Diagnosis not present

## 2019-04-22 DIAGNOSIS — D689 Coagulation defect, unspecified: Secondary | ICD-10-CM | POA: Diagnosis present

## 2019-04-22 DIAGNOSIS — Z9282 Status post administration of tPA (rtPA) in a different facility within the last 24 hours prior to admission to current facility: Secondary | ICD-10-CM

## 2019-04-22 DIAGNOSIS — I471 Supraventricular tachycardia: Secondary | ICD-10-CM | POA: Diagnosis not present

## 2019-04-22 DIAGNOSIS — J189 Pneumonia, unspecified organism: Secondary | ICD-10-CM | POA: Diagnosis not present

## 2019-04-22 DIAGNOSIS — J9501 Hemorrhage from tracheostomy stoma: Secondary | ICD-10-CM | POA: Diagnosis not present

## 2019-04-22 DIAGNOSIS — N186 End stage renal disease: Secondary | ICD-10-CM | POA: Diagnosis present

## 2019-04-22 DIAGNOSIS — I132 Hypertensive heart and chronic kidney disease with heart failure and with stage 5 chronic kidney disease, or end stage renal disease: Secondary | ICD-10-CM | POA: Diagnosis present

## 2019-04-22 DIAGNOSIS — I69391 Dysphagia following cerebral infarction: Secondary | ICD-10-CM | POA: Diagnosis not present

## 2019-04-22 DIAGNOSIS — Z0189 Encounter for other specified special examinations: Secondary | ICD-10-CM

## 2019-04-22 DIAGNOSIS — I519 Heart disease, unspecified: Secondary | ICD-10-CM | POA: Diagnosis present

## 2019-04-22 DIAGNOSIS — R29810 Facial weakness: Secondary | ICD-10-CM | POA: Diagnosis present

## 2019-04-22 DIAGNOSIS — Z931 Gastrostomy status: Secondary | ICD-10-CM

## 2019-04-22 DIAGNOSIS — Z8249 Family history of ischemic heart disease and other diseases of the circulatory system: Secondary | ICD-10-CM

## 2019-04-22 DIAGNOSIS — G92 Toxic encephalopathy: Secondary | ICD-10-CM | POA: Diagnosis not present

## 2019-04-22 DIAGNOSIS — I5022 Chronic systolic (congestive) heart failure: Secondary | ICD-10-CM | POA: Diagnosis not present

## 2019-04-22 DIAGNOSIS — R471 Dysarthria and anarthria: Secondary | ICD-10-CM | POA: Diagnosis present

## 2019-04-22 DIAGNOSIS — Z452 Encounter for adjustment and management of vascular access device: Secondary | ICD-10-CM

## 2019-04-22 DIAGNOSIS — I5042 Chronic combined systolic (congestive) and diastolic (congestive) heart failure: Secondary | ICD-10-CM | POA: Diagnosis present

## 2019-04-22 DIAGNOSIS — R5081 Fever presenting with conditions classified elsewhere: Secondary | ICD-10-CM | POA: Diagnosis not present

## 2019-04-22 DIAGNOSIS — I4892 Unspecified atrial flutter: Secondary | ICD-10-CM | POA: Diagnosis not present

## 2019-04-22 DIAGNOSIS — J9601 Acute respiratory failure with hypoxia: Secondary | ICD-10-CM | POA: Diagnosis not present

## 2019-04-22 DIAGNOSIS — I255 Ischemic cardiomyopathy: Secondary | ICD-10-CM | POA: Diagnosis not present

## 2019-04-22 DIAGNOSIS — F1920 Other psychoactive substance dependence, uncomplicated: Secondary | ICD-10-CM | POA: Diagnosis present

## 2019-04-22 DIAGNOSIS — I9589 Other hypotension: Secondary | ICD-10-CM | POA: Diagnosis not present

## 2019-04-22 DIAGNOSIS — D75829 Heparin-induced thrombocytopenia, unspecified: Secondary | ICD-10-CM | POA: Diagnosis not present

## 2019-04-22 DIAGNOSIS — Z978 Presence of other specified devices: Secondary | ICD-10-CM

## 2019-04-22 DIAGNOSIS — Z6831 Body mass index (BMI) 31.0-31.9, adult: Secondary | ICD-10-CM

## 2019-04-22 DIAGNOSIS — Z713 Dietary counseling and surveillance: Secondary | ICD-10-CM

## 2019-04-22 DIAGNOSIS — R0902 Hypoxemia: Secondary | ICD-10-CM

## 2019-04-22 DIAGNOSIS — L89159 Pressure ulcer of sacral region, unspecified stage: Secondary | ICD-10-CM | POA: Diagnosis not present

## 2019-04-22 DIAGNOSIS — I447 Left bundle-branch block, unspecified: Secondary | ICD-10-CM | POA: Clinically undetermined

## 2019-04-22 DIAGNOSIS — T80211A Bloodstream infection due to central venous catheter, initial encounter: Secondary | ICD-10-CM | POA: Diagnosis not present

## 2019-04-22 DIAGNOSIS — I6340 Cerebral infarction due to embolism of unspecified cerebral artery: Secondary | ICD-10-CM | POA: Diagnosis not present

## 2019-04-22 DIAGNOSIS — E785 Hyperlipidemia, unspecified: Secondary | ICD-10-CM

## 2019-04-22 DIAGNOSIS — L8990 Pressure ulcer of unspecified site, unspecified stage: Secondary | ICD-10-CM | POA: Diagnosis not present

## 2019-04-22 DIAGNOSIS — R809 Proteinuria, unspecified: Secondary | ICD-10-CM | POA: Diagnosis present

## 2019-04-22 DIAGNOSIS — N170 Acute kidney failure with tubular necrosis: Secondary | ICD-10-CM | POA: Diagnosis not present

## 2019-04-22 DIAGNOSIS — D72829 Elevated white blood cell count, unspecified: Secondary | ICD-10-CM | POA: Diagnosis not present

## 2019-04-22 DIAGNOSIS — Z992 Dependence on renal dialysis: Secondary | ICD-10-CM

## 2019-04-22 DIAGNOSIS — T85598A Other mechanical complication of other gastrointestinal prosthetic devices, implants and grafts, initial encounter: Secondary | ICD-10-CM

## 2019-04-22 DIAGNOSIS — Y95 Nosocomial condition: Secondary | ICD-10-CM | POA: Diagnosis not present

## 2019-04-22 DIAGNOSIS — R778 Other specified abnormalities of plasma proteins: Secondary | ICD-10-CM | POA: Diagnosis not present

## 2019-04-22 DIAGNOSIS — E872 Acidosis, unspecified: Secondary | ICD-10-CM | POA: Diagnosis present

## 2019-04-22 DIAGNOSIS — R0603 Acute respiratory distress: Secondary | ICD-10-CM | POA: Diagnosis present

## 2019-04-22 DIAGNOSIS — D631 Anemia in chronic kidney disease: Secondary | ICD-10-CM | POA: Diagnosis present

## 2019-04-22 DIAGNOSIS — Z4659 Encounter for fitting and adjustment of other gastrointestinal appliance and device: Secondary | ICD-10-CM

## 2019-04-22 DIAGNOSIS — R1312 Dysphagia, oropharyngeal phase: Secondary | ICD-10-CM | POA: Diagnosis present

## 2019-04-22 DIAGNOSIS — Z9911 Dependence on respirator [ventilator] status: Secondary | ICD-10-CM

## 2019-04-22 DIAGNOSIS — R29700 NIHSS score 0: Secondary | ICD-10-CM | POA: Diagnosis present

## 2019-04-22 DIAGNOSIS — Z515 Encounter for palliative care: Secondary | ICD-10-CM

## 2019-04-22 DIAGNOSIS — I5020 Unspecified systolic (congestive) heart failure: Secondary | ICD-10-CM | POA: Diagnosis not present

## 2019-04-22 DIAGNOSIS — Y838 Other surgical procedures as the cause of abnormal reaction of the patient, or of later complication, without mention of misadventure at the time of the procedure: Secondary | ICD-10-CM | POA: Diagnosis not present

## 2019-04-22 DIAGNOSIS — T183XXA Foreign body in small intestine, initial encounter: Secondary | ICD-10-CM

## 2019-04-22 DIAGNOSIS — R29898 Other symptoms and signs involving the musculoskeletal system: Secondary | ICD-10-CM

## 2019-04-22 DIAGNOSIS — G4700 Insomnia, unspecified: Secondary | ICD-10-CM | POA: Diagnosis not present

## 2019-04-22 DIAGNOSIS — I69321 Dysphasia following cerebral infarction: Secondary | ICD-10-CM

## 2019-04-22 DIAGNOSIS — R000 Tachycardia, unspecified: Secondary | ICD-10-CM | POA: Diagnosis present

## 2019-04-22 DIAGNOSIS — J9690 Respiratory failure, unspecified, unspecified whether with hypoxia or hypercapnia: Secondary | ICD-10-CM

## 2019-04-22 DIAGNOSIS — E7220 Disorder of urea cycle metabolism, unspecified: Secondary | ICD-10-CM | POA: Clinically undetermined

## 2019-04-22 DIAGNOSIS — Z22322 Carrier or suspected carrier of Methicillin resistant Staphylococcus aureus: Secondary | ICD-10-CM

## 2019-04-22 DIAGNOSIS — E874 Mixed disorder of acid-base balance: Secondary | ICD-10-CM | POA: Diagnosis present

## 2019-04-22 DIAGNOSIS — I5023 Acute on chronic systolic (congestive) heart failure: Secondary | ICD-10-CM | POA: Diagnosis not present

## 2019-04-22 DIAGNOSIS — L89606 Pressure-induced deep tissue damage of unspecified heel: Secondary | ICD-10-CM | POA: Diagnosis not present

## 2019-04-22 DIAGNOSIS — G9389 Other specified disorders of brain: Secondary | ICD-10-CM | POA: Diagnosis present

## 2019-04-22 DIAGNOSIS — I4729 Other ventricular tachycardia: Secondary | ICD-10-CM

## 2019-04-22 DIAGNOSIS — F1410 Cocaine abuse, uncomplicated: Secondary | ICD-10-CM | POA: Diagnosis not present

## 2019-04-22 DIAGNOSIS — F4310 Post-traumatic stress disorder, unspecified: Secondary | ICD-10-CM | POA: Diagnosis present

## 2019-04-22 DIAGNOSIS — D638 Anemia in other chronic diseases classified elsewhere: Secondary | ICD-10-CM | POA: Diagnosis present

## 2019-04-22 DIAGNOSIS — I5084 End stage heart failure: Secondary | ICD-10-CM | POA: Diagnosis not present

## 2019-04-22 DIAGNOSIS — I959 Hypotension, unspecified: Secondary | ICD-10-CM | POA: Diagnosis not present

## 2019-04-22 DIAGNOSIS — E871 Hypo-osmolality and hyponatremia: Secondary | ICD-10-CM | POA: Diagnosis not present

## 2019-04-22 DIAGNOSIS — I5021 Acute systolic (congestive) heart failure: Secondary | ICD-10-CM | POA: Diagnosis not present

## 2019-04-22 DIAGNOSIS — J9503 Malfunction of tracheostomy stoma: Secondary | ICD-10-CM | POA: Diagnosis not present

## 2019-04-22 DIAGNOSIS — Z79899 Other long term (current) drug therapy: Secondary | ICD-10-CM

## 2019-04-22 DIAGNOSIS — I10 Essential (primary) hypertension: Secondary | ICD-10-CM | POA: Diagnosis present

## 2019-04-22 DIAGNOSIS — K761 Chronic passive congestion of liver: Secondary | ICD-10-CM | POA: Diagnosis not present

## 2019-04-22 DIAGNOSIS — F329 Major depressive disorder, single episode, unspecified: Secondary | ICD-10-CM | POA: Diagnosis present

## 2019-04-22 DIAGNOSIS — J690 Pneumonitis due to inhalation of food and vomit: Secondary | ICD-10-CM | POA: Diagnosis present

## 2019-04-22 DIAGNOSIS — Z7982 Long term (current) use of aspirin: Secondary | ICD-10-CM

## 2019-04-22 DIAGNOSIS — I240 Acute coronary thrombosis not resulting in myocardial infarction: Secondary | ICD-10-CM | POA: Diagnosis present

## 2019-04-22 DIAGNOSIS — Z720 Tobacco use: Secondary | ICD-10-CM | POA: Diagnosis present

## 2019-04-22 DIAGNOSIS — I5043 Acute on chronic combined systolic (congestive) and diastolic (congestive) heart failure: Secondary | ICD-10-CM | POA: Diagnosis present

## 2019-04-22 DIAGNOSIS — T85898A Other specified complication of other internal prosthetic devices, implants and grafts, initial encounter: Secondary | ICD-10-CM | POA: Diagnosis not present

## 2019-04-22 DIAGNOSIS — E160 Drug-induced hypoglycemia without coma: Secondary | ICD-10-CM | POA: Diagnosis not present

## 2019-04-22 DIAGNOSIS — F419 Anxiety disorder, unspecified: Secondary | ICD-10-CM | POA: Diagnosis present

## 2019-04-22 DIAGNOSIS — N179 Acute kidney failure, unspecified: Secondary | ICD-10-CM

## 2019-04-22 DIAGNOSIS — R491 Aphonia: Secondary | ICD-10-CM | POA: Diagnosis present

## 2019-04-22 DIAGNOSIS — R791 Abnormal coagulation profile: Secondary | ICD-10-CM | POA: Diagnosis present

## 2019-04-22 DIAGNOSIS — E876 Hypokalemia: Secondary | ICD-10-CM | POA: Diagnosis present

## 2019-04-22 DIAGNOSIS — R063 Periodic breathing: Secondary | ICD-10-CM

## 2019-04-22 DIAGNOSIS — E8809 Other disorders of plasma-protein metabolism, not elsewhere classified: Secondary | ICD-10-CM | POA: Clinically undetermined

## 2019-04-22 DIAGNOSIS — J9602 Acute respiratory failure with hypercapnia: Secondary | ICD-10-CM | POA: Diagnosis not present

## 2019-04-22 DIAGNOSIS — E875 Hyperkalemia: Secondary | ICD-10-CM | POA: Diagnosis present

## 2019-04-22 DIAGNOSIS — F05 Delirium due to known physiological condition: Secondary | ICD-10-CM | POA: Diagnosis not present

## 2019-04-22 DIAGNOSIS — E669 Obesity, unspecified: Secondary | ICD-10-CM | POA: Diagnosis present

## 2019-04-22 DIAGNOSIS — J90 Pleural effusion, not elsewhere classified: Secondary | ICD-10-CM | POA: Diagnosis present

## 2019-04-22 DIAGNOSIS — G8191 Hemiplegia, unspecified affecting right dominant side: Secondary | ICD-10-CM | POA: Diagnosis present

## 2019-04-22 DIAGNOSIS — E1165 Type 2 diabetes mellitus with hyperglycemia: Secondary | ICD-10-CM | POA: Diagnosis present

## 2019-04-22 DIAGNOSIS — E1169 Type 2 diabetes mellitus with other specified complication: Secondary | ICD-10-CM | POA: Diagnosis present

## 2019-04-22 DIAGNOSIS — R0689 Other abnormalities of breathing: Secondary | ICD-10-CM | POA: Diagnosis not present

## 2019-04-22 DIAGNOSIS — G9341 Metabolic encephalopathy: Secondary | ICD-10-CM | POA: Diagnosis present

## 2019-04-22 DIAGNOSIS — E778 Other disorders of glycoprotein metabolism: Secondary | ICD-10-CM | POA: Diagnosis present

## 2019-04-22 DIAGNOSIS — N19 Unspecified kidney failure: Secondary | ICD-10-CM | POA: Diagnosis not present

## 2019-04-22 DIAGNOSIS — G4733 Obstructive sleep apnea (adult) (pediatric): Secondary | ICD-10-CM | POA: Diagnosis present

## 2019-04-22 DIAGNOSIS — R0602 Shortness of breath: Secondary | ICD-10-CM

## 2019-04-22 DIAGNOSIS — T189XXA Foreign body of alimentary tract, part unspecified, initial encounter: Secondary | ICD-10-CM | POA: Diagnosis not present

## 2019-04-22 DIAGNOSIS — I5189 Other ill-defined heart diseases: Secondary | ICD-10-CM | POA: Diagnosis present

## 2019-04-22 DIAGNOSIS — R29706 NIHSS score 6: Secondary | ICD-10-CM | POA: Diagnosis not present

## 2019-04-22 HISTORY — PX: RADIOLOGY WITH ANESTHESIA: SHX6223

## 2019-04-22 HISTORY — PX: IR PERCUTANEOUS ART THROMBECTOMY/INFUSION INTRACRANIAL INC DIAG ANGIO: IMG6087

## 2019-04-22 HISTORY — PX: IR CT HEAD LTD: IMG2386

## 2019-04-22 LAB — COMPREHENSIVE METABOLIC PANEL
ALT: 23 U/L (ref 0–44)
AST: 21 U/L (ref 15–41)
Albumin: 3.4 g/dL — ABNORMAL LOW (ref 3.5–5.0)
Alkaline Phosphatase: 95 U/L (ref 38–126)
Anion gap: 14 (ref 5–15)
BUN: 17 mg/dL (ref 6–20)
CO2: 24 mmol/L (ref 22–32)
Calcium: 9.5 mg/dL (ref 8.9–10.3)
Chloride: 98 mmol/L (ref 98–111)
Creatinine, Ser: 1.17 mg/dL (ref 0.61–1.24)
GFR calc Af Amer: >60 mL/min (ref >60)
GFR calc non Af Amer: >60 mL/min (ref >60)
Glucose, Bld: 153 mg/dL — ABNORMAL HIGH (ref 70–99)
Potassium: 3.4 mmol/L — ABNORMAL LOW (ref 3.5–5.1)
Sodium: 136 mmol/L (ref 135–145)
Total Bilirubin: 0.5 mg/dL (ref 0.3–1.2)
Total Protein: 6.6 g/dL (ref 6.5–8.1)

## 2019-04-22 LAB — CBC
HCT: 42.8 % (ref 39.0–52.0)
Hemoglobin: 14.9 g/dL (ref 13.0–17.0)
MCH: 31 pg (ref 26.0–34.0)
MCHC: 34.8 g/dL (ref 30.0–36.0)
MCV: 89 fL (ref 80.0–100.0)
Platelets: 243 10*3/uL (ref 150–400)
RBC: 4.81 MIL/uL (ref 4.22–5.81)
RDW: 12.1 % (ref 11.5–15.5)
WBC: 7.2 10*3/uL (ref 4.0–10.5)
nRBC: 0 % (ref 0.0–0.2)

## 2019-04-22 LAB — POCT I-STAT 7, (LYTES, BLD GAS, ICA,H+H)
Acid-base deficit: 4 mmol/L — ABNORMAL HIGH (ref 0.0–2.0)
Acid-base deficit: 11 mmol/L — ABNORMAL HIGH (ref 0.0–2.0)
Bicarbonate: 22.4 mmol/L (ref 20.0–28.0)
Bicarbonate: 13 mmol/L — ABNORMAL LOW (ref 20.0–28.0)
Calcium, Ion: 1.22 mmol/L (ref 1.15–1.40)
Calcium, Ion: 1.02 mmol/L — ABNORMAL LOW (ref 1.15–1.40)
HCT: 43 % (ref 39.0–52.0)
HCT: 44 % (ref 39.0–52.0)
Hemoglobin: 14.6 g/dL (ref 13.0–17.0)
Hemoglobin: 15 g/dL (ref 13.0–17.0)
O2 Saturation: 97 %
O2 Saturation: 93 %
Patient temperature: 96.6
Potassium: 4.2 mmol/L (ref 3.5–5.1)
Potassium: 7.3 mmol/L (ref 3.5–5.1)
Sodium: 136 mmol/L (ref 135–145)
Sodium: 134 mmol/L — ABNORMAL LOW (ref 135–145)
TCO2: 24 mmol/L (ref 22–32)
TCO2: 14 mmol/L — ABNORMAL LOW (ref 22–32)
pCO2 arterial: 47.1 mmHg (ref 32.0–48.0)
pCO2 arterial: 24.1 mmHg — ABNORMAL LOW (ref 32.0–48.0)
pH, Arterial: 7.286 — ABNORMAL LOW (ref 7.350–7.450)
pH, Arterial: 7.336 — ABNORMAL LOW (ref 7.350–7.450)
pO2, Arterial: 103 mmHg (ref 83.0–108.0)
pO2, Arterial: 67 mmHg — ABNORMAL LOW (ref 83.0–108.0)

## 2019-04-22 LAB — APTT: aPTT: 24 seconds (ref 24–36)

## 2019-04-22 LAB — I-STAT CHEM 8, ED
BUN: 17 mg/dL (ref 6–20)
Calcium, Ion: 1.21 mmol/L (ref 1.15–1.40)
Chloride: 102 mmol/L (ref 98–111)
Creatinine, Ser: 1.1 mg/dL (ref 0.61–1.24)
Glucose, Bld: 149 mg/dL — ABNORMAL HIGH (ref 70–99)
HCT: 41 % (ref 39.0–52.0)
Hemoglobin: 13.9 g/dL (ref 13.0–17.0)
Potassium: 3.4 mmol/L — ABNORMAL LOW (ref 3.5–5.1)
Sodium: 136 mmol/L (ref 135–145)
TCO2: 28 mmol/L (ref 22–32)

## 2019-04-22 LAB — RESPIRATORY PANEL BY RT PCR (FLU A&B, COVID)
Influenza A by PCR: NEGATIVE
Influenza B by PCR: NEGATIVE
SARS Coronavirus 2 by RT PCR: NEGATIVE

## 2019-04-22 LAB — GLUCOSE, CAPILLARY
Glucose-Capillary: 216 mg/dL — ABNORMAL HIGH (ref 70–99)
Glucose-Capillary: 249 mg/dL — ABNORMAL HIGH (ref 70–99)
Glucose-Capillary: 141 mg/dL — ABNORMAL HIGH (ref 70–99)
Glucose-Capillary: 188 mg/dL — ABNORMAL HIGH (ref 70–99)
Glucose-Capillary: 218 mg/dL — ABNORMAL HIGH (ref 70–99)

## 2019-04-22 LAB — PROTIME-INR
INR: 1 (ref 0.8–1.2)
Prothrombin Time: 12.6 seconds (ref 11.4–15.2)

## 2019-04-22 LAB — DIFFERENTIAL
Abs Immature Granulocytes: 0.02 10*3/uL (ref 0.00–0.07)
Basophils Absolute: 0 10*3/uL (ref 0.0–0.1)
Basophils Relative: 0 %
Eosinophils Absolute: 0.1 10*3/uL (ref 0.0–0.5)
Eosinophils Relative: 1 %
Immature Granulocytes: 0 %
Lymphocytes Relative: 31 %
Lymphs Abs: 2.2 10*3/uL (ref 0.7–4.0)
Monocytes Absolute: 0.5 10*3/uL (ref 0.1–1.0)
Monocytes Relative: 8 %
Neutro Abs: 4.3 10*3/uL (ref 1.7–7.7)
Neutrophils Relative %: 60 %

## 2019-04-22 LAB — POTASSIUM
Potassium: 7.2 mmol/L (ref 3.5–5.1)
Potassium: 5.6 mmol/L — ABNORMAL HIGH (ref 3.5–5.1)

## 2019-04-22 LAB — ETHANOL: Alcohol, Ethyl (B): <10 mg/dL (ref <10)

## 2019-04-22 LAB — MAGNESIUM: Magnesium: 2.1 mg/dL (ref 1.7–2.4)

## 2019-04-22 LAB — ECHOCARDIOGRAM COMPLETE
Height: 73 in
Weight: 4380.98 oz

## 2019-04-22 LAB — MRSA PCR SCREENING: MRSA by PCR: POSITIVE — AB

## 2019-04-22 LAB — TSH: TSH: 2.237 u[IU]/mL (ref 0.350–4.500)

## 2019-04-22 LAB — HEMOGLOBIN A1C
Hgb A1c MFr Bld: 6.5 % — ABNORMAL HIGH (ref 4.8–5.6)
Mean Plasma Glucose: 139.85 mg/dL

## 2019-04-22 SURGERY — RADIOLOGY WITH ANESTHESIA
Anesthesia: General

## 2019-04-22 MED ORDER — PANTOPRAZOLE SODIUM 40 MG IV SOLR: 40.0000 mg | Freq: Every day | INTRAVENOUS | Status: DC

## 2019-04-22 MED ORDER — NITROGLYCERIN 1 MG/10 ML FOR IR/CATH LAB
INTRA_ARTERIAL | Status: AC
  Administered 2019-04-22: 50 ug via INTRA_ARTERIAL
  Administered 2019-04-22: 25 ug via INTRA_ARTERIAL
  Filled 2019-04-22: qty 10

## 2019-04-22 MED ORDER — EPHEDRINE SULFATE-NACL 50-0.9 MG/10ML-% IV SOSY
PREFILLED_SYRINGE | INTRAVENOUS | Status: DC | PRN
  Administered 2019-04-22: 5 mg via INTRAVENOUS

## 2019-04-22 MED ORDER — IOHEXOL 350 MG/ML SOLN
75.0000 mL | Freq: Once | INTRAVENOUS | Status: AC | PRN
  Administered 2019-04-22: 04:00:00 75 mL via INTRAVENOUS

## 2019-04-22 MED ORDER — ACETAMINOPHEN 160 MG/5ML PO SOLN
650.0000 mg | ORAL | Status: DC | PRN
  Administered 2019-04-22 – 2019-04-23 (×4): 650 mg
  Filled 2019-04-22 (×3): qty 20.3

## 2019-04-22 MED ORDER — PHENYLEPHRINE HCL-NACL 10-0.9 MG/250ML-% IV SOLN
0.0000 ug/min | INTRAVENOUS | Status: DC
  Administered 2019-04-22: 13:00:00 200 ug/min via INTRAVENOUS
  Administered 2019-04-22 (×2): 400 ug/min via INTRAVENOUS
  Administered 2019-04-22: 18:00:00 380 ug/min via INTRAVENOUS
  Administered 2019-04-22 (×2): 200 ug/min via INTRAVENOUS
  Administered 2019-04-22 (×2): 400 ug/min via INTRAVENOUS
  Administered 2019-04-22: 380 ug/min via INTRAVENOUS
  Administered 2019-04-22: 170 ug/min via INTRAVENOUS
  Administered 2019-04-22: 14:00:00 13.333 ug/min via INTRAVENOUS
  Administered 2019-04-22: 170 ug/min via INTRAVENOUS
  Filled 2019-04-22: qty 250
  Filled 2019-04-22: qty 500
  Filled 2019-04-22: qty 750
  Filled 2019-04-22: qty 500
  Filled 2019-04-22: qty 250
  Filled 2019-04-22: qty 1000

## 2019-04-22 MED ORDER — ATORVASTATIN CALCIUM 40 MG PO TABS
40.0000 mg | ORAL_TABLET | Freq: Every day | ORAL | Status: DC
  Administered 2019-04-22: 40 mg
  Filled 2019-04-22: qty 1

## 2019-04-22 MED ORDER — FLUOXETINE HCL 20 MG PO CAPS
20.0000 mg | ORAL_CAPSULE | Freq: Every day | ORAL | Status: DC
  Filled 2019-04-22: qty 1

## 2019-04-22 MED ORDER — SODIUM BICARBONATE 8.4 % IV SOLN
50.0000 meq | Freq: Once | INTRAVENOUS | Status: AC
  Administered 2019-04-22: 50 meq via INTRAVENOUS
  Filled 2019-04-22: qty 50

## 2019-04-22 MED ORDER — DIGOXIN 125 MCG PO TABS
0.1250 mg | ORAL_TABLET | Freq: Every day | ORAL | Status: DC
  Administered 2019-04-22 – 2019-04-23 (×2): 0.125 mg
  Filled 2019-04-22 (×2): qty 1

## 2019-04-22 MED ORDER — ACETAMINOPHEN 325 MG PO TABS: 650.0000 mg | ORAL_TABLET | ORAL | Status: DC | PRN

## 2019-04-22 MED ORDER — MUPIROCIN 2 % EX OINT
1.0000 "application " | TOPICAL_OINTMENT | Freq: Two times a day (BID) | CUTANEOUS | Status: AC
  Administered 2019-04-22 – 2019-04-27 (×10): 1 via NASAL
  Filled 2019-04-22: qty 22

## 2019-04-22 MED ORDER — CHLORHEXIDINE GLUCONATE CLOTH 2 % EX PADS
6.0000 | MEDICATED_PAD | Freq: Every day | CUTANEOUS | Status: DC
  Administered 2019-04-25: 6 via TOPICAL

## 2019-04-22 MED ORDER — DOCUSATE SODIUM 50 MG/5ML PO LIQD
100.0000 mg | Freq: Two times a day (BID) | ORAL | Status: DC | PRN
  Administered 2019-06-19: 100 mg
  Filled 2019-04-22: qty 10

## 2019-04-22 MED ORDER — CEFAZOLIN SODIUM-DEXTROSE 2-3 GM-%(50ML) IV SOLR
INTRAVENOUS | Status: DC | PRN
  Administered 2019-04-22: 2 g via INTRAVENOUS

## 2019-04-22 MED ORDER — SODIUM CHLORIDE 0.9 % IV SOLN: INTRAVENOUS | Status: DC

## 2019-04-22 MED ORDER — SODIUM CHLORIDE 0.9% FLUSH
10.0000 mL | INTRAVENOUS | Status: DC | PRN
  Administered 2019-06-20 – 2019-06-27 (×3): 10 mL

## 2019-04-22 MED ORDER — TICAGRELOR 90 MG PO TABS
ORAL_TABLET | ORAL | Status: AC
  Filled 2019-04-22: qty 2

## 2019-04-22 MED ORDER — DIGOXIN 125 MCG PO TABS
0.1250 mg | ORAL_TABLET | Freq: Every day | ORAL | Status: DC
  Filled 2019-04-22: qty 1

## 2019-04-22 MED ORDER — BISACODYL 10 MG RE SUPP: 10.0000 mg | Freq: Every day | RECTAL | Status: DC | PRN

## 2019-04-22 MED ORDER — TIROFIBAN HCL IN NACL 5-0.9 MG/100ML-% IV SOLN
INTRAVENOUS | Status: AC
  Filled 2019-04-22: qty 100

## 2019-04-22 MED ORDER — ETOMIDATE 2 MG/ML IV SOLN
INTRAVENOUS | Status: DC | PRN
  Administered 2019-04-22: 12 mg via INTRAVENOUS

## 2019-04-22 MED ORDER — ATORVASTATIN CALCIUM 40 MG PO TABS: 40.0000 mg | ORAL_TABLET | Freq: Every day | ORAL | Status: DC

## 2019-04-22 MED ORDER — ALTEPLASE (STROKE) FULL DOSE INFUSION
90.0000 mg | Freq: Once | INTRAVENOUS | Status: AC
  Administered 2019-04-22: 05:00:00 90 mg via INTRAVENOUS
  Filled 2019-04-22: qty 100

## 2019-04-22 MED ORDER — SODIUM CHLORIDE 0.9% FLUSH
10.0000 mL | Freq: Two times a day (BID) | INTRAVENOUS | Status: DC
  Administered 2019-04-23 (×2): 10 mL
  Administered 2019-04-24: 20 mL
  Administered 2019-04-24 – 2019-04-25 (×2): 10 mL
  Administered 2019-04-25: 40 mL
  Administered 2019-04-27 – 2019-05-01 (×10): 10 mL
  Administered 2019-05-02 (×2): 30 mL
  Administered 2019-05-03 – 2019-05-04 (×2): 10 mL
  Administered 2019-05-04: 20 mL
  Administered 2019-05-05: 30 mL
  Administered 2019-05-05: 20 mL
  Administered 2019-05-06: 10 mL
  Administered 2019-05-06: 30 mL
  Administered 2019-05-06: 40 mL
  Administered 2019-05-07 – 2019-05-08 (×4): 10 mL
  Administered 2019-05-09: 20 mL
  Administered 2019-05-10 – 2019-05-11 (×3): 10 mL
  Administered 2019-05-12: 40 mL
  Administered 2019-05-12: 10 mL
  Administered 2019-05-13: 30 mL
  Administered 2019-05-13 – 2019-05-15 (×5): 10 mL
  Administered 2019-05-16: 30 mL
  Administered 2019-05-16: 10 mL
  Administered 2019-05-17: 40 mL
  Administered 2019-05-17: 10 mL
  Administered 2019-05-18 – 2019-05-19 (×3): 20 mL
  Administered 2019-05-19 – 2019-06-16 (×44): 10 mL
  Administered 2019-06-16: 20 mL
  Administered 2019-06-17 – 2019-06-21 (×9): 10 mL
  Administered 2019-06-22: 20 mL
  Administered 2019-06-22 – 2019-07-08 (×28): 10 mL
  Administered 2019-07-09: 20 mL
  Administered 2019-07-09: 10 mL

## 2019-04-22 MED ORDER — DEXMEDETOMIDINE HCL IN NACL 400 MCG/100ML IV SOLN
INTRAVENOUS | Status: DC | PRN
  Administered 2019-04-22: .7 ug/kg/h via INTRAVENOUS

## 2019-04-22 MED ORDER — PHENYLEPHRINE HCL-NACL 10-0.9 MG/250ML-% IV SOLN
0.0000 ug/min | INTRAVENOUS | Status: DC
  Administered 2019-04-22: 09:00:00 100 ug/min via INTRAVENOUS
  Filled 2019-04-22: qty 500

## 2019-04-22 MED ORDER — DEXTROSE 50 % IV SOLN
1.0000 | Freq: Once | INTRAVENOUS | Status: AC
  Administered 2019-04-22: 50 mL via INTRAVENOUS
  Filled 2019-04-22: qty 50

## 2019-04-22 MED ORDER — DEXMEDETOMIDINE HCL IN NACL 400 MCG/100ML IV SOLN
0.0000 ug/kg/h | INTRAVENOUS | Status: DC
  Administered 2019-04-22: 18:00:00 0.4 ug/kg/h via INTRAVENOUS
  Administered 2019-04-22: 1 ug/kg/h via INTRAVENOUS
  Administered 2019-04-23 (×3): 1.2 ug/kg/h via INTRAVENOUS
  Filled 2019-04-22 (×5): qty 100

## 2019-04-22 MED ORDER — CHLORHEXIDINE GLUCONATE 0.12% ORAL RINSE (MEDLINE KIT)
15.0000 mL | Freq: Two times a day (BID) | OROMUCOSAL | Status: DC
  Administered 2019-04-22 – 2019-06-28 (×135): 15 mL via OROMUCOSAL

## 2019-04-22 MED ORDER — IOHEXOL 300 MG/ML  SOLN
150.0000 mL | Freq: Once | INTRAMUSCULAR | Status: AC | PRN
  Administered 2019-04-22: 75 mL via INTRA_ARTERIAL

## 2019-04-22 MED ORDER — EPTIFIBATIDE 20 MG/10ML IV SOLN
INTRAVENOUS | Status: AC
  Filled 2019-04-22: qty 10

## 2019-04-22 MED ORDER — CEFAZOLIN SODIUM-DEXTROSE 2-4 GM/100ML-% IV SOLN
INTRAVENOUS | Status: AC
  Filled 2019-04-22: qty 100

## 2019-04-22 MED ORDER — PHENYLEPHRINE HCL-NACL 10-0.9 MG/250ML-% IV SOLN
INTRAVENOUS | Status: DC | PRN
  Administered 2019-04-22: 25 ug/min via INTRAVENOUS

## 2019-04-22 MED ORDER — FENTANYL CITRATE (PF) 100 MCG/2ML IJ SOLN
50.0000 ug | INTRAMUSCULAR | Status: DC | PRN
  Administered 2019-04-22 (×3): 100 ug via INTRAVENOUS
  Administered 2019-04-22: 200 ug via INTRAVENOUS
  Filled 2019-04-22 (×2): qty 4
  Filled 2019-04-22 (×3): qty 2

## 2019-04-22 MED ORDER — NICARDIPINE HCL IN NACL 20-0.86 MG/200ML-% IV SOLN: 0.0000 mg/h | INTRAVENOUS | Status: DC | PRN

## 2019-04-22 MED ORDER — ACETAMINOPHEN 160 MG/5ML PO SOLN: 650.0000 mg | ORAL | Status: DC | PRN

## 2019-04-22 MED ORDER — ORAL CARE MOUTH RINSE: 15.0000 mL | OROMUCOSAL | Status: DC

## 2019-04-22 MED ORDER — LABETALOL HCL 5 MG/ML IV SOLN
10.0000 mg | Freq: Once | INTRAVENOUS | Status: DC | PRN
  Filled 2019-04-22: qty 4

## 2019-04-22 MED ORDER — STROKE: EARLY STAGES OF RECOVERY BOOK
Freq: Once | Status: AC
  Filled 2019-04-22: qty 1

## 2019-04-22 MED ORDER — MIDAZOLAM HCL 2 MG/2ML IJ SOLN
2.0000 mg | INTRAMUSCULAR | Status: DC | PRN
  Administered 2019-04-22 – 2019-05-09 (×38): 2 mg via INTRAVENOUS
  Filled 2019-04-22 (×42): qty 2

## 2019-04-22 MED ORDER — PHENYLEPHRINE CONCENTRATED 100MG/250ML (0.4 MG/ML) INFUSION SIMPLE
0.0000 ug/min | INTRAVENOUS | Status: DC
  Administered 2019-04-22: 20 ug/min via INTRAVENOUS
  Filled 2019-04-22 (×2): qty 250

## 2019-04-22 MED ORDER — ASPIRIN 81 MG PO CHEW
CHEWABLE_TABLET | ORAL | Status: AC
  Filled 2019-04-22: qty 1

## 2019-04-22 MED ORDER — FLUOXETINE HCL 20 MG PO CAPS
20.0000 mg | ORAL_CAPSULE | Freq: Every day | ORAL | Status: DC
  Administered 2019-04-22 – 2019-04-25 (×4): 20 mg
  Filled 2019-04-22 (×4): qty 1

## 2019-04-22 MED ORDER — CLOPIDOGREL BISULFATE 300 MG PO TABS
ORAL_TABLET | ORAL | Status: AC
  Filled 2019-04-22: qty 1

## 2019-04-22 MED ORDER — ACETAMINOPHEN 650 MG RE SUPP: 650.0000 mg | RECTAL | Status: DC | PRN

## 2019-04-22 MED ORDER — FENTANYL CITRATE (PF) 250 MCG/5ML IJ SOLN
INTRAMUSCULAR | Status: DC | PRN
  Administered 2019-04-22 (×2): 100 ug via INTRAVENOUS

## 2019-04-22 MED ORDER — ORAL CARE MOUTH RINSE
15.0000 mL | OROMUCOSAL | Status: DC
  Administered 2019-04-22 – 2019-06-13 (×503): 15 mL via OROMUCOSAL

## 2019-04-22 MED ORDER — ROCURONIUM BROMIDE 10 MG/ML (PF) SYRINGE
PREFILLED_SYRINGE | INTRAVENOUS | Status: DC | PRN
  Administered 2019-04-22: 30 mg via INTRAVENOUS
  Administered 2019-04-22: 20 mg via INTRAVENOUS
  Administered 2019-04-22: 40 mg via INTRAVENOUS
  Administered 2019-04-22: 60 mg via INTRAVENOUS

## 2019-04-22 MED ORDER — SUCCINYLCHOLINE CHLORIDE 200 MG/10ML IV SOSY
PREFILLED_SYRINGE | INTRAVENOUS | Status: DC | PRN
  Administered 2019-04-22: 120 mg via INTRAVENOUS

## 2019-04-22 MED ORDER — NOREPINEPHRINE 16 MG/250ML-% IV SOLN
0.0000 ug/min | INTRAVENOUS | Status: DC
  Administered 2019-04-22: 2 ug/min via INTRAVENOUS
  Administered 2019-04-23: 36 ug/min via INTRAVENOUS
  Administered 2019-04-23: 22 ug/min via INTRAVENOUS
  Administered 2019-04-23: 40 ug/min via INTRAVENOUS
  Administered 2019-04-24: 08:00:00 26 ug/min via INTRAVENOUS
  Administered 2019-04-24: 05:00:00 30 ug/min via INTRAVENOUS
  Administered 2019-04-25: 8 ug/min via INTRAVENOUS
  Administered 2019-04-26: 32 ug/min via INTRAVENOUS
  Administered 2019-04-26: 06:00:00 26 ug/min via INTRAVENOUS
  Administered 2019-04-27: 28 ug/min via INTRAVENOUS
  Filled 2019-04-22 (×10): qty 250

## 2019-04-22 MED ORDER — SODIUM CHLORIDE 0.9 % IV SOLN: 50.0000 mL | Freq: Once | INTRAVENOUS | Status: DC

## 2019-04-22 MED ORDER — PANTOPRAZOLE SODIUM 40 MG IV SOLR
40.0000 mg | Freq: Every day | INTRAVENOUS | Status: DC
  Administered 2019-04-22 – 2019-04-25 (×4): 40 mg via INTRAVENOUS
  Filled 2019-04-22 (×5): qty 40

## 2019-04-22 MED ORDER — INSULIN ASPART 100 UNIT/ML IV SOLN
10.0000 [IU] | Freq: Once | INTRAVENOUS | Status: AC
  Administered 2019-04-22: 10 [IU] via INTRAVENOUS

## 2019-04-22 MED ORDER — CALCIUM GLUCONATE 10 % IV SOLN
1.0000 g | Freq: Once | INTRAVENOUS | Status: DC
  Filled 2019-04-22: qty 10

## 2019-04-22 MED ORDER — PHENYLEPHRINE 40 MCG/ML (10ML) SYRINGE FOR IV PUSH (FOR BLOOD PRESSURE SUPPORT)
PREFILLED_SYRINGE | INTRAVENOUS | Status: DC | PRN
  Administered 2019-04-22 (×2): 80 ug via INTRAVENOUS

## 2019-04-22 MED ORDER — LACTATED RINGERS IV SOLN: INTRAVENOUS | Status: DC | PRN

## 2019-04-22 MED ORDER — FENTANYL CITRATE (PF) 100 MCG/2ML IJ SOLN
INTRAMUSCULAR | Status: AC
  Administered 2019-04-22: 200 ug via INTRAVENOUS
  Filled 2019-04-22: qty 2

## 2019-04-22 MED ORDER — FENTANYL CITRATE (PF) 250 MCG/5ML IJ SOLN
INTRAMUSCULAR | Status: AC
  Filled 2019-04-22: qty 5

## 2019-04-22 MED ORDER — INSULIN ASPART 100 UNIT/ML ~~LOC~~ SOLN
0.0000 [IU] | SUBCUTANEOUS | Status: DC
  Administered 2019-04-22: 5 [IU] via SUBCUTANEOUS
  Administered 2019-04-22: 3 [IU] via SUBCUTANEOUS
  Administered 2019-04-22: 2 [IU] via SUBCUTANEOUS
  Administered 2019-04-22: 5 [IU] via SUBCUTANEOUS
  Administered 2019-04-23: 17:00:00 8 [IU] via SUBCUTANEOUS
  Administered 2019-04-23: 3 [IU] via SUBCUTANEOUS
  Administered 2019-04-23 (×2): 5 [IU] via SUBCUTANEOUS
  Administered 2019-04-23 (×2): 3 [IU] via SUBCUTANEOUS
  Administered 2019-04-24: 5 [IU] via SUBCUTANEOUS
  Administered 2019-04-24: 8 [IU] via SUBCUTANEOUS
  Administered 2019-04-24: 04:00:00 5 [IU] via SUBCUTANEOUS

## 2019-04-22 MED ORDER — IOHEXOL 350 MG/ML SOLN
50.0000 mL | Freq: Once | INTRAVENOUS | Status: AC | PRN
  Administered 2019-04-22: 50 mL via INTRAVENOUS

## 2019-04-22 MED ORDER — ONDANSETRON HCL 4 MG/2ML IJ SOLN
INTRAMUSCULAR | Status: DC | PRN
  Administered 2019-04-22: 4 mg via INTRAVENOUS

## 2019-04-22 MED ORDER — VASOPRESSIN 20 UNIT/ML IV SOLN
INTRAVENOUS | Status: DC | PRN
  Administered 2019-04-22: 2 [IU] via INTRAVENOUS
  Administered 2019-04-22: 1 [IU] via INTRAVENOUS
  Administered 2019-04-22 (×3): 2 [IU] via INTRAVENOUS

## 2019-04-22 MED ORDER — SODIUM CHLORIDE 0.9 % IV SOLN: 50.0000 mL/h | INTRAVENOUS | Status: DC

## 2019-04-22 MED ORDER — CHLORHEXIDINE GLUCONATE 0.12% ORAL RINSE (MEDLINE KIT): 15.0000 mL | Freq: Two times a day (BID) | OROMUCOSAL | Status: DC

## 2019-04-22 MED ORDER — ALBUTEROL SULFATE (2.5 MG/3ML) 0.083% IN NEBU
10.0000 mg | INHALATION_SOLUTION | Freq: Once | RESPIRATORY_TRACT | Status: AC
  Administered 2019-04-22: 23:00:00 10 mg via RESPIRATORY_TRACT
  Filled 2019-04-22: qty 12

## 2019-04-22 NOTE — Procedures (Signed)
Central Venous Catheter Insertion Procedure Note Carlos Brewer PO:[id number] Dec 10, 1985  Procedure: Insertion of Central Venous Catheter Indications: Assessment of intravascular volume, Drug and/or fluid administration and Frequent blood sampling  Procedure Details Consent: Risks of procedure as well as the alternatives and risks of each were explained to the (patient/caregiver).  Consent for procedure obtained. Time Out: Verified patient identification, verified procedure, site/side was marked, verified correct patient position, special equipment/implants available, medications/allergies/relevent history reviewed, required imaging and test results available.  Performed  Maximum sterile technique was used including antiseptics, cap, gloves, gown, hand hygiene, mask and sheet. Skin prep: Chlorhexidine; local anesthetic administered A antimicrobial bonded/coated triple lumen catheter was placed in the left internal jugular vein using the Seldinger technique. Catheter placed to 20 cm. Blood aspirated via all 3 ports and then flushed x 3. Line sutured x 2 and dressing applied.  Ultrasound guidance used.Yes.    Evaluation Blood flow good Complications: No apparent complications Patient did tolerate procedure well. Chest X-ray ordered to verify placement.  CXR: pending.   Georgann Housekeeper, AGACNP-BC [place]  See Amion for personal pager PCCM on call pager [phone]  04/23/2019 6:41 PM

## 2019-04-22 NOTE — Progress Notes (Signed)
  Echocardiogram 2D Echocardiogram has been performed.  Carlos Brewer 04/25/2019, 10:58 AM

## 2019-04-22 NOTE — Transfer of Care (Signed)
Immediate Anesthesia Transfer of Care Note  Patient: Carlos Brewer.  Procedure(s) Performed: RADIOLOGY WITH ANESTHESIA (N/A )  Patient Location: ICU  Anesthesia Type:General  Level of Consciousness: sedated and Patient remains intubated per anesthesia plan  Airway & Oxygen Therapy: Patient remains intubated per anesthesia plan and Patient placed on Ventilator (see vital sign flow sheet for setting)  Post-op Assessment: Report given to RN and Post -op Vital signs reviewed and stable  Post vital signs: Reviewed and stable  Last Vitals:  Vitals Value Taken Time  BP 110/72 04/21/2019 0745  Temp    Pulse 85 05/09/2019 0747  Resp 16 04/29/2019 0747  SpO2 100 % 05/05/2019 0747  Vitals shown include unvalidated device data.  Last Pain:  Vitals:   04/26/2019 0545  PainSc: 0-No pain         Complications: No apparent anesthesia complications

## 2019-04-22 NOTE — ED Provider Notes (Signed)
[facility] EMERGENCY DEPARTMENT Provider Note   CSN: [id number] Arrival date & time: 04/21/2019  [id number]  An emergency department physician performed an initial assessment on this suspected stroke patient at 0356.  History No chief complaint on file.   LEVEL 5 CAVEAT 2/2 ACUITY OF CONDITION  Carlos Looker. is a 34 y.o. male.   34 y/o male with hx of NICM (LVEF 10-15% in 06/2018), myocarditis, cocaine abuse, sleep apnea, obesity presents to the emergency department as a code stroke.  Last known well between 1 AM and 2 AM.  Patient reportedly had trouble gripping with his right hand as well as difficulty expressing speech.  Has had some spontaneous improvement to these deficits, but continues to report difficulty with word finding.  EMS denies any reported drug use tonight.  Patient is not chronically anticoagulated.  The history is provided by the patient. No language interpreter was used.       Past Medical History:  Diagnosis Date  . CHF (congestive heart failure) ([place])   . Cocaine abuse ([place])   . HFrEF (heart failure with reduced ejection fraction) ([place])    a. 10/2016 Echo: EF 15-20%, Gr2 DD, mildly dil LA/RA.  Marland Kitchen History of cardiac cath    a. 10/2016 Cath: LM nl, LAD min irregs, LCX nl, RCA nl, EF 15%.  . Morbid obesity ([place])   . Myocarditis ([place])    a. 10/2016 Admit w/ CHF and trop elevation; b. 10/2016 Echo: EF 15-20%; c. 10/2016 Cath: Min irregs in LAD otw nl cors, EF 15%, glob HK.  Marland Kitchen NICM (nonischemic cardiomyopathy) ([place])    a. 10/2016 Echo: EF 15-20%, Gr2 DD; b. 10/2016 Cath: Nl cors.  . Palpitations   . Sleep apnea    USES CPAP  . Tobacco abuse     Patient Active Problem List   Diagnosis Date Noted  . COVID-19 virus infection 12/14/2018  . COVID-19 12/13/2018  . Obesity (BMI 30-39.9)   . OSA on CPAP   . CKD (chronic kidney disease), stage II   . Polysubstance (excluding opioids) dependence ([place])   . Acute on chronic systolic CHF (congestive  heart failure) ([place]) 07/12/2018  . Morbid obesity ([place]) 02/07/2017  . Nonischemic cardiomyopathy ([place]) 11/07/2016  . Chronic systolic heart failure ([place]) 11/05/2016  . Anxiety 11/05/2016  . Substance use disorder 11/05/2016  . Pneumonia 10/24/2016    Past Surgical History:  Procedure Laterality Date  . CORONARY ANGIOPLASTY    . NO PAST SURGERIES    . RIGHT/LEFT HEART CATH AND CORONARY ANGIOGRAPHY N/A 10/27/2016   Procedure: RIGHT/LEFT HEART CATH AND CORONARY ANGIOGRAPHY;  Surgeon: Wellington Hampshire, MD;  Location: [facility];  Service: Cardiovascular;  Laterality: N/A;  . WISDOM TOOTH EXTRACTION  2008       Family History  Problem Relation Age of Onset  . Healthy Mother   . Heart failure Father        EF is 35%    Social History   Tobacco Use  . Smoking status: Current Every Day Smoker    Packs/day: 0.50    Types: Cigarettes  . Smokeless tobacco: Never Used  Substance Use Topics  . Alcohol use: Yes  . Drug use: Not Currently    Types: Cocaine    Comment: quit 10/23/2016     Home Medications Prior to Admission medications   Medication Sig Start Date End Date Taking? Authorizing Provider  acetaminophen (TYLENOL) 500 MG tablet Take 2 tablets (1,000 mg  total) by mouth every 6 (six) hours as needed. Patient not taking: Reported on 08/23/2018 10/12/16   Gerarda Fraction A, NP  albuterol (ACCUNEB) 1.25 MG/3ML nebulizer solution Take 3 mLs by nebulization 4 (four) times daily as needed for wheezing or shortness of breath. 08/23/18   [provider]  albuterol (VENTOLIN HFA) 108 (90 Base) MCG/ACT inhaler Inhale 1-2 puffs into the lungs every 6 (six) hours as needed for wheezing or shortness of breath. 07/17/18   Florencia Reasons, MD  aspirin EC 81 MG EC tablet Take 1 tablet (81 mg total) by mouth daily. 10/30/16   Demetrios Loll, MD  atorvastatin (LIPITOR) 40 MG tablet Take 1 tablet (40 mg total) by mouth daily at 6 PM. 08/23/18   Bensimhon, Shaune Pascal, MD  bacitracin ointment  Apply 1 application topically 2 (two) times daily. 08/28/18   Montine Circle, PA-C  carvedilol (COREG) 3.125 MG tablet Take 1 tablet (3.125 mg total) by mouth 2 (two) times daily with a meal. 08/23/18   Bensimhon, Shaune Pascal, MD  digoxin (LANOXIN) 0.125 MG tablet Take 1 tablet (0.125 mg total) by mouth daily. 08/23/18   Bensimhon, Shaune Pascal, MD  FLUoxetine (PROZAC) 20 MG capsule Take 1 capsule (20 mg total) by mouth daily. 12/19/18   Caren Griffins, MD  ibuprofen (ADVIL) 200 MG tablet Take 600 mg by mouth every 6 (six) hours as needed for moderate pain.    [provider]  insulin aspart (NOVOLOG) 100 UNIT/ML injection Inject 10 Units into the skin 3 (three) times daily before meals.    [provider]  insulin detemir (LEVEMIR) 100 UNIT/ML injection Inject 40 Units into the skin daily.    [provider]  metolazone (ZAROXOLYN) 2.5 MG tablet Take 1 tablet (2.5 mg total) by mouth daily as needed for up to 30 days. Take metolazone 2.5mg  x1 only for 5pound weight gain, please take potassium 17meq x1 with metolazone. Patient not taking: Reported on 07/23/2018 07/17/18 08/16/18  Florencia Reasons, MD  Multiple Vitamin (MULTI-VITAMIN) tablet Take 1 tablet by mouth daily.    [provider]  pantoprazole (PROTONIX) 40 MG tablet Take 40 mg by mouth daily.    [provider]  potassium chloride SA (K-DUR) 20 MEQ tablet Take 1 tablet (20 mEq total) by mouth daily. Can take extra one if taking metolazone. 08/23/18   Bensimhon, Shaune Pascal, MD  sacubitril-valsartan (ENTRESTO) 24-26 MG Take 1 tablet by mouth 2 (two) times daily. 08/23/18   Bensimhon, Shaune Pascal, MD  spironolactone (ALDACTONE) 25 MG tablet Take 1 tablet (25 mg total) by mouth daily. 08/23/18   Bensimhon, Shaune Pascal, MD  torsemide (DEMADEX) 20 MG tablet Take 2 tablets (40 mg total) by mouth 2 (two) times daily. Patient taking differently: Take 40 mg by mouth daily.  08/23/18   Bensimhon, Shaune Pascal, MD    Allergies    Patient has no  known allergies.  Review of Systems   Review of Systems  Unable to perform ROS: Acuity of condition    Physical Exam Updated Vital Signs BP 110/60 (BP Location: Right Arm)   Pulse 100   Resp 17   SpO2 99%   Physical Exam Vitals and nursing note reviewed.  Constitutional:      General: He is not in acute distress.    Appearance: He is well-developed. He is not diaphoretic.     Comments: Obese AA male  HENT:     Head: Normocephalic and atraumatic.  Eyes:  General: No scleral icterus.    Conjunctiva/sclera: Conjunctivae normal.  Pulmonary:     Effort: Pulmonary effort is normal. No respiratory distress.     Comments: Respirations even and unlabored Musculoskeletal:        General: Normal range of motion.     Cervical back: Normal range of motion.  Skin:    General: Skin is warm and dry.     Coloration: Skin is not pale.     Findings: No erythema or rash.  Neurological:     Mental Status: He is alert.     GCS: GCS eye subscore is 4. GCS verbal subscore is 4. GCS motor subscore is 6.     Comments: GCS 15. Speech is aphasic. No cranial nerve deficits appreciated; symmetric eyebrow raise, no facial drooping, tongue midline. Patient has equal grip strength bilaterally with preserved strength against resistance in all major muscle groups bilaterally. Sensation to light touch intact. Patient moves extremities without ataxia. No pronator drift. Gait not assessed.  Psychiatric:        Behavior: Behavior normal.     ED Results / Procedures / Treatments   Labs (all labs ordered are listed, but only abnormal results are displayed) Labs Reviewed  I-STAT CHEM 8, ED - Abnormal; Notable for the following components:      Result Value   Potassium 3.4 (*)    Glucose, Bld 149 (*)    All other components within normal limits  RESPIRATORY PANEL BY RT PCR (FLU A&B, COVID)  ETHANOL  PROTIME-INR  APTT  CBC  DIFFERENTIAL  COMPREHENSIVE METABOLIC PANEL  RAPID URINE DRUG SCREEN, HOSP  PERFORMED  URINALYSIS, ROUTINE W REFLEX MICROSCOPIC    EKG None  Radiology CT Code Stroke CTA Head W/WO contrast  Result Date: 05/05/2019 CLINICAL DATA:  34 year old male code stroke presentation. Suspicious left MCA sylvian fissure branch hyperdensity. History of cardiac issues with decreased cardiac ejection fraction. EXAM: CT ANGIOGRAPHY HEAD AND NECK TECHNIQUE: Multidetector CT imaging of the head and neck was performed using the standard protocol during bolus administration of intravenous contrast. Multiplanar CT image reconstructions and MIPs were obtained to evaluate the vascular anatomy. Carotid stenosis measurements (when applicable) are obtained utilizing NASCET criteria, using the distal internal carotid diameter as the denominator. CONTRAST:  66mL OMNIPAQUE IOHEXOL 350 MG/ML SOLN COMPARISON:  Plain head CT 0402 hours today. FINDINGS: CTA NECK Skeleton: Lower cervical spine endplate degeneration. Mild reversal of lordosis. Upper chest: Negative upper lungs and mediastinum. Other neck: Negative. Aortic arch: Suboptimal but adequate contrast bolus. Three vessel arch configuration with no arch atherosclerosis. Right carotid system: Negative right CCA and right carotid bifurcation. Tortuous but otherwise negative cervical right ICA. Left carotid system: Negative left CCA and left carotid bifurcation. Negative cervical left ICA. Vertebral arteries: The proximal right subclavian artery and cervical right vertebral artery appear normal. The right vertebral is dominant. Normal proximal left subclavian artery. The non dominant left vertebral artery is diminutive but remains patent to the cisterna magna. CTA HEAD Posterior circulation: The non dominant left vertebral artery seems to functionally terminates in PICA and/or an anterior spinal artery. The right vertebral artery supplies the basilar. No right vertebral or basilar artery stenosis identified. But both vessels are somewhat diminutive. Both  posterior communicating arteries are present. Patent SCA origins. Overall fetal type PCAs. Bilateral PCA branches are grossly normal. Anterior circulation: Both ICA siphons are patent without plaque or stenosis. Normal posterior communicating artery origins. Patent carotid termini. Normal MCA and ACA origins. Bilateral ACA  branches are within normal limits. Right MCA M1 and bifurcation are patent without stenosis. Right MCA branches are within normal limits. The left MCA M1 is patent and gives off an anterior temporal artery on series 9, image 118. Then at the expected location of the left MCA bifurcation or trifurcation the vessel is occluded (series 11, image 20, series 5, image 57). There is reconstitution of the dominant posterior division branch. Venous sinuses: Early contrast timing, not evaluated. Anatomic variants: Dominant right vertebral artery, the left appears to terminates in PICA and/or the anterior spinal artery. Fetal type PCA origins. Review of the MIP images confirms the above findings IMPRESSION: 1. Positive for emergent large vessel occlusion at the Left MCA bifurcation. There is reconstitution of the dominant posterior division branch. This preliminary result was communicated to Dr. Leonel Ramsay at 0413 hours, and then we discussed by telephone. 2. Elsewhere negative CTA head and neck. Dominant right vertebral artery which supplies the basilar. Fetal type PCA origins. Electronically Signed   By: Genevie Ann M.D.   On: 05/04/2019 04:22   CT Code Stroke CTA Neck W/WO contrast  Result Date: 05/07/2019 CLINICAL DATA:  34 year old male code stroke presentation. Suspicious left MCA sylvian fissure branch hyperdensity. History of cardiac issues with decreased cardiac ejection fraction. EXAM: CT ANGIOGRAPHY HEAD AND NECK TECHNIQUE: Multidetector CT imaging of the head and neck was performed using the standard protocol during bolus administration of intravenous contrast. Multiplanar CT image  reconstructions and MIPs were obtained to evaluate the vascular anatomy. Carotid stenosis measurements (when applicable) are obtained utilizing NASCET criteria, using the distal internal carotid diameter as the denominator. CONTRAST:  64mL OMNIPAQUE IOHEXOL 350 MG/ML SOLN COMPARISON:  Plain head CT 0402 hours today. FINDINGS: CTA NECK Skeleton: Lower cervical spine endplate degeneration. Mild reversal of lordosis. Upper chest: Negative upper lungs and mediastinum. Other neck: Negative. Aortic arch: Suboptimal but adequate contrast bolus. Three vessel arch configuration with no arch atherosclerosis. Right carotid system: Negative right CCA and right carotid bifurcation. Tortuous but otherwise negative cervical right ICA. Left carotid system: Negative left CCA and left carotid bifurcation. Negative cervical left ICA. Vertebral arteries: The proximal right subclavian artery and cervical right vertebral artery appear normal. The right vertebral is dominant. Normal proximal left subclavian artery. The non dominant left vertebral artery is diminutive but remains patent to the cisterna magna. CTA HEAD Posterior circulation: The non dominant left vertebral artery seems to functionally terminates in PICA and/or an anterior spinal artery. The right vertebral artery supplies the basilar. No right vertebral or basilar artery stenosis identified. But both vessels are somewhat diminutive. Both posterior communicating arteries are present. Patent SCA origins. Overall fetal type PCAs. Bilateral PCA branches are grossly normal. Anterior circulation: Both ICA siphons are patent without plaque or stenosis. Normal posterior communicating artery origins. Patent carotid termini. Normal MCA and ACA origins. Bilateral ACA branches are within normal limits. Right MCA M1 and bifurcation are patent without stenosis. Right MCA branches are within normal limits. The left MCA M1 is patent and gives off an anterior temporal artery on series 9,  image 118. Then at the expected location of the left MCA bifurcation or trifurcation the vessel is occluded (series 11, image 20, series 5, image 57). There is reconstitution of the dominant posterior division branch. Venous sinuses: Early contrast timing, not evaluated. Anatomic variants: Dominant right vertebral artery, the left appears to terminates in PICA and/or the anterior spinal artery. Fetal type PCA origins. Review of the MIP images confirms the above  findings IMPRESSION: 1. Positive for emergent large vessel occlusion at the Left MCA bifurcation. There is reconstitution of the dominant posterior division branch. This preliminary result was communicated to Dr. Leonel Ramsay at 0413 hours, and then we discussed by telephone. 2. Elsewhere negative CTA head and neck. Dominant right vertebral artery which supplies the basilar. Fetal type PCA origins. Electronically Signed   By: Genevie Ann M.D.   On: 04/21/2019 04:22   CT HEAD CODE STROKE WO CONTRAST  Result Date: 04/21/2019 CLINICAL DATA:  Code stroke.  34 year old male EXAM: CT HEAD WITHOUT CONTRAST TECHNIQUE: Contiguous axial images were obtained from the base of the skull through the vertex without intravenous contrast. COMPARISON:  Head CT 08/28/2018. FINDINGS: Brain: No midline shift, mass effect, or evidence of intracranial mass lesion. No ventriculomegaly. No acute intracranial hemorrhage identified. Evidence of chronic cortical encephalomalacia in the right frontal operculum (series 3, image 23). No superimposed acute cortically based infarct identified. Vascular: Suspicious asymmetric hyperdensity of a left MCA branch in the sylvian fissure on series 3, image 16. Skull: Stable, negative. Sinuses/Orbits: Visualized paranasal sinuses and mastoids are stable and well pneumatized. Other: No acute orbit or scalp soft tissue findings. ASPECTS [facility] Stroke Program Early CT Score) Total score (0-10 with 10 being normal): 10 (left MCA territory, chronic  cortical encephalomalacia right frontal operculum). IMPRESSION: 1. Asymmetric hyperdensity of a Left MCA branch in the Sylvian fissure suspicious for emergent large vessel occlusion in the setting. 2. No associated acute cortically based infarct, ASPECTS 10. No acute hemorrhage. 3. Chronic right frontal operculum encephalomalacia. 4. These results were communicated to Dr. Leonel Ramsay at 4:06 am on 04/25/2019 by text page via the [facility] messaging system. Electronically Signed   By: Genevie Ann M.D.   On: 05/02/2019 04:10    Procedures .Critical Care Performed by: Antonietta Breach, PA-C Authorized by: Antonietta Breach, PA-C   Critical care provider statement:    Critical care time (minutes):  45   Critical care was necessary to treat or prevent imminent or life-threatening deterioration of the following conditions:  CNS failure or compromise (Acute CVA)   Critical care was time spent personally by me on the following activities:  Discussions with consultants, evaluation of patient's response to treatment, examination of patient, ordering and performing treatments and interventions, ordering and review of laboratory studies, ordering and review of radiographic studies, pulse oximetry, re-evaluation of patient's condition, obtaining history from patient or surrogate and review of old charts   (including critical care time)  Medications Ordered in ED Medications  iohexol (OMNIPAQUE) 350 MG/ML injection 75 mL (75 mLs Intravenous Contrast Given 04/24/2019 0409)    ED Course  I have reviewed the triage vital signs and the nursing notes.  Pertinent labs & imaging results that were available during my care of the patient were reviewed by me and considered in my medical decision making (see chart for details).  Clinical Course as of Apr 21 599  Fri Apr 22, 2019  0418 CTA concerning for M2 occlusion. Will require admission to the NICU.   [KH]  [phone] Per wife, last known well between 1-2AM. Dr. Leonel Ramsay to initiate  orders for TPA.   [KH]  0600 Patient OTF to IR for thrombectomy. Decision was made to proceed with IR intervention after aphasia worsened following administration of TPA.    [KH]    Clinical Course User Index [KH] Antonietta Breach, PA-C   MDM Rules/Calculators/A&P  34 y/o male with hx of NICM (LVEF 10-15%), cocaine abuse, obesity presenting as code stroke for aphasia. Noted to have left MCA occlusion. Initially given TPA and subsequently taken to IR for intervention given worsening aphasia with development of drift and facial droop. Will be admitted to Neuro ICU post procedure.   Final Clinical Impression(s) / ED Diagnoses Final diagnoses:  Acute CVA (cerebrovascular accident) [facility])    Rx / [place] Orders ED Discharge Orders    None       Antonietta Breach, PA-C 04/27/2019 0603    Fatima Blank, MD 05/11/2019 [phone]

## 2019-04-22 NOTE — Progress Notes (Signed)
Spoke with Dr. Tamala Julian (PCCM), next time patient wakes up, verbal order given to switch him to pressure support. As long as he pulls good volumes, Dr. Tamala Julian said it would be okay to extubate. I updated RT.

## 2019-04-22 NOTE — Progress Notes (Signed)
ETT retracted 2 cm and secured at 23 cm at the lip.

## 2019-04-22 NOTE — Anesthesia Preprocedure Evaluation (Addendum)
Anesthesia Evaluation  Patient identified by MRN, date of birth, ID band Patient confused    Reviewed: Allergy & Precautions, H&P , NPO status , Patient's Chart, lab work & pertinent test results, reviewed documented beta blocker date and time   Airway Mallampati: II  TM Distance: >3 FB Neck ROM: Full    Dental no notable dental hx. (+) Teeth Intact, Dental Advisory Given   Pulmonary sleep apnea , Current Smoker and Patient abstained from smoking.,    Pulmonary exam normal breath sounds clear to auscultation       Cardiovascular +CHF   Rhythm:Regular Rate:Tachycardia     Neuro/Psych Anxiety CVA, Residual Symptoms    GI/Hepatic negative GI ROS, Neg liver ROS,   Endo/Other  negative endocrine ROSMorbid obesity  Renal/GU negative Renal ROS  negative genitourinary   Musculoskeletal   Abdominal   Peds  Hematology negative hematology ROS (+)   Anesthesia Other Findings   Reproductive/Obstetrics negative OB ROS                            Anesthesia Physical Anesthesia Plan  ASA: IV and emergent  Anesthesia Plan: General   Post-op Pain Management:    Induction: Intravenous, Rapid sequence and Cricoid pressure planned  PONV Risk Score and Plan: 1 and Ondansetron and Treatment may vary due to age or medical condition  Airway Management Planned: Oral ETT  Additional Equipment: Arterial line  Intra-op Plan:   Post-operative Plan: Possible Post-op intubation/ventilation  Informed Consent: I have reviewed the patients History and Physical, chart, labs and discussed the procedure including the risks, benefits and alternatives for the proposed anesthesia with the patient or authorized representative who has indicated his/her understanding and acceptance.     Dental advisory given  Plan Discussed with: CRNA  Anesthesia Plan Comments:        Anesthesia Quick Evaluation

## 2019-04-22 NOTE — Anesthesia Procedure Notes (Signed)
Arterial Line Insertion Start/End02/10/2019 6:00 AM, 04/22/2019 6:01 AM Performed by: Jearld Pies, CRNA, CRNA  Patient location: OOR procedure area. Emergency situation Left, radial was placed Catheter size: 20 G Hand hygiene performed  and Seldinger technique used Allen's test indicative of satisfactory collateral circulation Attempts: 1 Procedure performed without using ultrasound guided technique. Following insertion, Biopatch and dressing applied. Post procedure assessment: normal  Patient tolerated the procedure well with no immediate complications.

## 2019-04-22 NOTE — Anesthesia Postprocedure Evaluation (Signed)
Anesthesia Post Note  Patient: Carlos Brewer.  Procedure(s) Performed: RADIOLOGY WITH ANESTHESIA (N/A )     Patient location during evaluation: SICU Anesthesia Type: General Level of consciousness: sedated Pain management: pain level controlled Vital Signs Assessment: post-procedure vital signs reviewed and stable Respiratory status: patient remains intubated per anesthesia plan Cardiovascular status: stable Postop Assessment: no apparent nausea or vomiting Anesthetic complications: no    Last Vitals:  Vitals:   05/05/2019 0737 04/29/2019 0745  BP: 105/61 110/72  Pulse: 88 85  Resp: 19 15  Temp: 37.1 C   SpO2: 100% 100%    Last Pain:  Vitals:   05/08/2019 0737  TempSrc: Axillary  PainSc:                  Carlos Brewer,W. EDMOND

## 2019-04-22 NOTE — Progress Notes (Signed)
eLink Physician-Brief Progress Note Patient Name: Carlos Brewer. DOB: Sep 01, 1985 MRN: [id number]   Date of Service  04/29/2019  HPI/Events of Note  Request for CXR review regarding central line placement  eICU Interventions  CXR done today 1949 reviewed. Left sided central line seen at the level of SVC and OK to use. ET tube 6.5 cm above the carina and spoke with bedside RN to have it pushed back in 1-2 cm. RT informed as well     Intervention Category Intermediate Interventions: Diagnostic test evaluation  Judd Lien 04/24/2019, 8:39 PM

## 2019-04-22 NOTE — Anesthesia Procedure Notes (Signed)
Procedure Name: Intubation Date/Time: 05/11/2019 5:46 AM Performed by: Jearld Pies, CRNA Pre-anesthesia Checklist: Patient identified, Emergency Drugs available, Suction available and Patient being monitored Patient Re-evaluated:Patient Re-evaluated prior to induction Oxygen Delivery Method: Circle System Utilized Preoxygenation: Pre-oxygenation with 100% oxygen Induction Type: IV induction, Rapid sequence and Cricoid Pressure applied Laryngoscope Size: Glidescope and 3 Grade View: Grade I Tube type: Oral Tube size: 7.5 mm Number of attempts: 1 Airway Equipment and Method: Stylet Placement Confirmation: ETT inserted through vocal cords under direct vision,  breath sounds checked- equal and bilateral and CO2 detector Secured at: 22 cm Tube secured with: Tape Dental Injury: Teeth and Oropharynx as per pre-operative assessment  Comments: Glidescope utilized d/t COVID19 precautions.

## 2019-04-22 NOTE — Progress Notes (Signed)
Informed Lottie Dawson, PA, that neo gtt was maxed, unable to reach SBP goals of 120-140 per Dr. Arlean Hopping order. She informed me that she informed Dr. Estanislado Pandy, as well. We are hoping to get sheath removed this afternoon and extubating him, later. Hoping that extubating will help resolve BP issues, as well. Will continue to monitor closely.

## 2019-04-22 NOTE — ED Notes (Signed)
Patient transported to IR on monitor with ED RN (WILL), TPA infusing.

## 2019-04-22 NOTE — Progress Notes (Signed)
ABG results hand-delivered to Dr. Halford Chessman.  Rate increased to 18 per his order.

## 2019-04-22 NOTE — Progress Notes (Signed)
eLink Physician-Brief Progress Note Patient Name: Carlos Brewer. DOB: 1985-10-05 MRN: [id number]   Date of Service  05/06/2019  HPI/Events of Note  Notified of K 7.3 with frequent ectopies. Also maxed on neosynephrine and norepinephrine  eICU Interventions  Ordered D50/insulin, albuterol and bicarb push. Held off on calcium until digoxin level checked.   Most recent K 5 and pressor requirement significantly decreased. Digoxin level low  Intervention Category Major Interventions: Hypotension - evaluation and management;Electrolyte abnormality - evaluation and management  Judd Lien 05/06/2019, 10:10 PM

## 2019-04-22 NOTE — Progress Notes (Signed)
Initial Nutrition Assessment  DOCUMENTATION CODES:   Obesity unspecified  INTERVENTION:   If unable to extubate recommend  Vital High Protein @ 60 ml/hr (1440 ml/day) via OG tube 30 ml Prostat TID  Provides: 1740 kcal, 171 grams protein, and 1203 ml free water.    NUTRITION DIAGNOSIS:   Inadequate oral intake related to inability to eat as evidenced by NPO status.  GOAL:   Patient will meet greater than or equal to 90% of their needs  MONITOR:   Vent status  REASON FOR ASSESSMENT:   Ventilator    ASSESSMENT:   Pt with PMH of CHF, OSA, poorly controlled DM, cocaine abuse admitted with L MCA stroke s/p tPA and IR thrombectomy.   Pt discussed during ICU rounds and with RN.  Possible extubation tomorrow.   Patient is currently intubated on ventilator support MV: 10.9 L/min Temp (24hrs), Avg:98.6 F (37 C), Min:98.5 F (36.9 C), Max:98.7 F (37.1 C)  Medications reviewed and include:  Neo @ 200 mcg  Labs reviewed:  CBG's: 216-249    NUTRITION - FOCUSED PHYSICAL EXAM:    Most Recent Value  Orbital Region  No depletion  Upper Arm Region  No depletion  Thoracic and Lumbar Region  No depletion  Buccal Region  Unable to assess  Temple Region  No depletion  Clavicle Bone Region  No depletion  Clavicle and Acromion Bone Region  No depletion  Scapular Bone Region  Unable to assess  Dorsal Hand  No depletion  Patellar Region  No depletion  Anterior Thigh Region  No depletion  Posterior Calf Region  No depletion  Edema (RD Assessment)  None  Hair  Reviewed  Eyes  Unable to assess  Mouth  Unable to assess  Skin  Reviewed  Nails  Reviewed       Diet Order:   Diet Order            Diet NPO time specified  Diet effective now              EDUCATION NEEDS:   No education needs have been identified at this time  Skin:  Skin Assessment: Reviewed RN Assessment  Last BM:  unknown  Height:   Ht Readings from Last 1 Encounters:  04/26/2019 6\' 1"   (1.854 m)    Weight:   Wt Readings from Last 1 Encounters:  04/28/2019 124.2 kg    Ideal Body Weight:  83.6 kg  BMI:  Body mass index is 36.13 kg/m.  Estimated Nutritional Needs:   Kcal:  1600-1800  Protein:  167 grams  Fluid:  2 L/day  Lockie Pares., RD, LDN, CNSC See AMiON for contact information

## 2019-04-22 NOTE — Progress Notes (Addendum)
RT obtained ABG on pt with the following results. RT awaiting to hear from CCM MD. RT increased pts FIO2 from 80% to 100% at this time. RT will continue to monitor.   PH 7.283 PCO2 35.1 PaO2  46 HCO3 17.8  Results for Carlos Brewer, Carlos Brewer (MRN [id number]) as of 04/23/2019 00:06  Ref. Range 05/04/2019 23:38  Sample type Unknown ARTERIAL  pH, Arterial Latest Ref Range: 7.350 - 7.450  7.283 (L)  pCO2 arterial Latest Ref Range: 32.0 - 48.0 mmHg 38.6  pO2, Arterial Latest Ref Range: 83.0 - 108.0 mmHg 46.0 (L)  TCO2 Latest Ref Range: 22 - 32 mmol/L 19 (L)  Acid-base deficit Latest Ref Range: 0.0 - 2.0 mmol/L 7.0 (H)  Bicarbonate Latest Ref Range: 20.0 - 28.0 mmol/L 17.8 (L)  O2 Saturation Latest Units: % 70.0  Patient temperature Unknown 102.5 F  Collection site Unknown ARTERIAL LINE

## 2019-04-22 NOTE — Procedures (Addendum)
S/P Lt common carotid arteriogram followed by complete revascularization of occluded LT MCA sup division mid M2 seg with x 1 pass with 53mx 40 mm solitaire X ret river device and penumbra aspiration with TICI 3 revascularization S.Soua Lenk MD.  Post procedure CT brain neg for ICH or mass effect. Patient left intubated with Rt groin 2F sheath to arterial flush. Distal pulses DPs and PTs dopplerable bilaterally. S.Analicia Skibinski MD

## 2019-04-22 NOTE — Progress Notes (Signed)
SLP Cancellation Note  Patient Details Name: Carlos Brewer. MRN: [id number] DOB: [birth year]   Cancelled treatment:       Reason Eval/Treat Not Completed: Medical issues which prohibited therapy. Pt intubated. Will f/u for readiness.    Yuritzy Zehring, Katherene Ponto 05/03/2019, 8:23 AM

## 2019-04-22 NOTE — Consult Note (Signed)
NAME:  Carlos Ratley., MRN:  [id number], DOB:  [birth year], LOS: 0 ADMISSION DATE:  05/06/2019, CONSULTATION DATE:  05/07/2019 REFERRING MD:  Dr. Leonel Ramsay, CHIEF COMPLAINT:  Slurred speech  Brief History   34 yo male smoker found to have slurred speech and Rt sided weakness.  CT head showed M2 occlusion.  Ultimately treated with thrombolytic and thrombectomy by IR.  Required intubation for airway protection.  Past Medical History  Systolic CHF with non ischemic CM, Cocaine abuse, OSA, DM  Significant Hospital Events   2/05 Admit, tPA, IR thrombectomy  Consults:  Neuro IR  Procedures:  ETT 2/05 >>   Significant Diagnostic Tests:  CT angio head/neck 2/05 >> occlusion of Lt MCA bifurcation Echo 2/05 >>   Micro Data:  SARS CoV2 PCR 2/05 >> negative Influenza PCR 2/05 >> negative  Antimicrobials:    Interim history/subjective:  Sedated, paralyzed on vent after neuro IR procedure.  Objective   Blood pressure (!) 120/101, pulse (!) 117, temperature 98.5 F (36.9 C), resp. rate (!) 30, height 6\' 1"  (1.854 m), SpO2 100 %.    Vent Mode: PRVC FiO2 (%):  [65 %] 65 % Set Rate:  [14 bmp] 14 bmp Vt Set:  [630 mL] 630 mL PEEP:  [5 cmH20] 5 cmH20 Plateau Pressure:  [26 cmH20] 26 cmH20   Intake/Output Summary (Last 24 hours) at 04/19/2019 0748 Last data filed at 05/08/2019 [id number] Gross per 24 hour  Intake 50 ml  Output 10 ml  Net 40 ml   There were no vitals filed for this visit.  Examination:  General - sedated Eyes - pupils reactive ENT - ETT in place Cardiac - regular rate/rhythm, no murmur Chest - no wheeze Abdomen - soft, non tender, + bowel sounds Extremities - no cyanosis, clubbing, or edema Skin - no rashes Neuro - sedated, paralyzed   Resolved Hospital Problem list     Assessment & Plan:   Acute respiratory failure with hypoxia and compromised airway in setting of CVA. Hx of OSA. Tobacco abuse. - full vent support until neuro status stable - goal  SpO2 > 92% - f/u CXR, ABG  Acute CVA with Lt MCA occlusion s/p thrombolytic and thrombectomy. - per neuro and neuro IR - RASS goal 0 to -1 while on vent  Hx of cocaine abuse. depression. - f/u UDS - continue prozac  Non ischemic CM with chronic systolic CHF. Hx of HTN, HLD. - goal SBP 120 to 140 for now per neuro IR - f/u Echo - followed by [facility] in [place] - continue lipitor, digoxin - resume ASA when okay with neurology - hold outpt coreg, zaroxolyn, entresto, aldactone, torsemide for now  DM type II poorly controlled. - SSI  Best practice:  Diet: NPO DVT prophylaxis: SCDs GI prophylaxis: protonix Mobility: bed rest Code Status: full code Disposition: unable to obtain  Labs   CBC: Recent Labs  Lab 04/29/2019 0355 04/28/2019 0430  WBC 7.2  --   NEUTROABS 4.3  --   HGB 14.9 13.9  HCT 42.8 41.0  MCV 89.0  --   PLT 243  --     Basic Metabolic Panel: Recent Labs  Lab 05/13/2019 0355 04/24/2019 0430  NA 136 136  K 3.4* 3.4*  CL 98 102  CO2 24  --   GLUCOSE 153* 149*  BUN 17 17  CREATININE 1.17 1.10  CALCIUM 9.5  --    GFR: CrCl cannot be calculated (Unknown ideal weight.). Recent Labs  Lab 05/03/2019 0355  WBC 7.2    Liver Function Tests: Recent Labs  Lab 04/19/2019 0355  AST 21  ALT 23  ALKPHOS 95  BILITOT 0.5  PROT 6.6  ALBUMIN 3.4*   No results for input(s): LIPASE, AMYLASE in the last 168 hours. No results for input(s): AMMONIA in the last 168 hours.  ABG    Component Value Date/Time   TCO2 28 05/04/2019 0430     Coagulation Profile: Recent Labs  Lab 04/23/2019 0355  INR 1.0    Cardiac Enzymes: No results for input(s): CKTOTAL, CKMB, CKMBINDEX, TROPONINI in the last 168 hours.  HbA1C: Hgb A1c MFr Bld  Date/Time Value Ref Range Status  04/20/2019 03:55 AM 6.5 (H) 4.8 - 5.6 % Final    Comment:    (NOTE) Pre diabetes:          5.7%-6.4% Diabetes:              >6.4% Glycemic control for   <7.0% adults with diabetes    12/14/2018 08:47 PM 9.7 (H) 4.8 - 5.6 % Final    Comment:    (NOTE) Pre diabetes:          5.7%-6.4% Diabetes:              >6.4% Glycemic control for   <7.0% adults with diabetes     CBG: No results for input(s): GLUCAP in the last 168 hours.  Review of Systems:   Unable to obtain  Past Medical History  He,  has a past medical history of CHF (congestive heart failure) ([place]), Cocaine abuse ([place]), HFrEF (heart failure with reduced ejection fraction) ([place]), History of cardiac cath, Morbid obesity ([place]), Myocarditis ([place]), NICM (nonischemic cardiomyopathy) ([place]), Palpitations, Sleep apnea, and Tobacco abuse.   Surgical History    Past Surgical History:  Procedure Laterality Date  . CORONARY ANGIOPLASTY    . NO PAST SURGERIES    . RIGHT/LEFT HEART CATH AND CORONARY ANGIOGRAPHY N/A 10/27/2016   Procedure: RIGHT/LEFT HEART CATH AND CORONARY ANGIOGRAPHY;  Surgeon: Wellington Hampshire, MD;  Location: [facility];  Service: Cardiovascular;  Laterality: N/A;  . WISDOM TOOTH EXTRACTION  2008     Social History   reports that he has been smoking cigarettes. He has been smoking about 0.50 packs per day. He has never used smokeless tobacco. He reports current alcohol use. He reports previous drug use. Drug: Cocaine.   Family History   His family history includes Healthy in his mother; Heart failure in his father.   Allergies No Known Allergies   Home Medications  Prior to Admission medications   Medication Sig Start Date End Date Taking? Authorizing Provider  acetaminophen (TYLENOL) 500 MG tablet Take 2 tablets (1,000 mg total) by mouth every 6 (six) hours as needed. Patient not taking: Reported on 08/23/2018 10/12/16   Gerarda Fraction A, NP  albuterol (ACCUNEB) 1.25 MG/3ML nebulizer solution Take 3 mLs by nebulization 4 (four) times daily as needed for wheezing or shortness of breath. 08/23/18   [provider]  albuterol (VENTOLIN HFA) 108 (90 Base) MCG/ACT inhaler  Inhale 1-2 puffs into the lungs every 6 (six) hours as needed for wheezing or shortness of breath. 07/17/18   Florencia Reasons, MD  aspirin EC 81 MG EC tablet Take 1 tablet (81 mg total) by mouth daily. 10/30/16   Demetrios Loll, MD  atorvastatin (LIPITOR) 40 MG tablet Take 1 tablet (40 mg total) by mouth daily at 6 PM. 08/23/18  Bensimhon, Shaune Pascal, MD  bacitracin ointment Apply 1 application topically 2 (two) times daily. 08/28/18   Montine Circle, PA-C  carvedilol (COREG) 3.125 MG tablet Take 1 tablet (3.125 mg total) by mouth 2 (two) times daily with a meal. 08/23/18   Bensimhon, Shaune Pascal, MD  digoxin (LANOXIN) 0.125 MG tablet Take 1 tablet (0.125 mg total) by mouth daily. 08/23/18   Bensimhon, Shaune Pascal, MD  FLUoxetine (PROZAC) 20 MG capsule Take 1 capsule (20 mg total) by mouth daily. 12/19/18   Caren Griffins, MD  ibuprofen (ADVIL) 200 MG tablet Take 600 mg by mouth every 6 (six) hours as needed for moderate pain.    [provider]  insulin aspart (NOVOLOG) 100 UNIT/ML injection Inject 10 Units into the skin 3 (three) times daily before meals.    [provider]  insulin detemir (LEVEMIR) 100 UNIT/ML injection Inject 40 Units into the skin daily.    [provider]  metolazone (ZAROXOLYN) 2.5 MG tablet Take 1 tablet (2.5 mg total) by mouth daily as needed for up to 30 days. Take metolazone 2.5mg  x1 only for 5pound weight gain, please take potassium 37meq x1 with metolazone. Patient not taking: Reported on 07/23/2018 07/17/18 08/16/18  Florencia Reasons, MD  Multiple Vitamin (MULTI-VITAMIN) tablet Take 1 tablet by mouth daily.    [provider]  pantoprazole (PROTONIX) 40 MG tablet Take 40 mg by mouth daily.    [provider]  potassium chloride SA (K-DUR) 20 MEQ tablet Take 1 tablet (20 mEq total) by mouth daily. Can take extra one if taking metolazone. 08/23/18   Bensimhon, Shaune Pascal, MD  sacubitril-valsartan (ENTRESTO) 24-26 MG Take 1 tablet by mouth 2 (two) times daily. 08/23/18    Bensimhon, Shaune Pascal, MD  spironolactone (ALDACTONE) 25 MG tablet Take 1 tablet (25 mg total) by mouth daily. 08/23/18   Bensimhon, Shaune Pascal, MD  torsemide (DEMADEX) 20 MG tablet Take 2 tablets (40 mg total) by mouth 2 (two) times daily. Patient taking differently: Take 40 mg by mouth daily.  08/23/18   Bensimhon, Shaune Pascal, MD     Critical care time: 13 minutes    Chesley Mires, MD [place] 04/27/2019, 8:10 AM

## 2019-04-22 NOTE — Progress Notes (Signed)
Pharmacist Code Stroke Response  Notified to mix tPA at 0428 by Dr. Leonel Ramsay Delivered tPA to RN at [phone]  tPA dose = 9mg  bolus over 1 minute followed by 81mg  for a total dose of 90mg  over 1 hour  Issues/delays encountered (if applicable): Sx initially improved then pt declined again, leading to decision to give alteplase.  Wynona Neat, PharmD, BCPS  05/09/2019 4:40 AM

## 2019-04-22 NOTE — H&P (Signed)
Neurology H&P  CC: Speech difficulty  History is obtained from: Patient  HPI: Carlos Brewer. is a 34 y.o. male who was last in his normal state of health "sometime between 1 and 2 AM." At that time, he spoke with his daughter and he was speaking normally.  Around 3 AM, he began having difficulty with his speech and right-sided weakness.  He was try to use his phone and was unable to.  He also states that he might have had some trouble with his left hand as well that he noticed, but was more noticing his right hand.   He was brought in as a code stroke, and en route he completely resolved.  On arrival he had an NIHSS of 0 with preserved naming, repetition, fluency.  A CT head and CTA were obtained which demonstrated M2 occlusion.  Despite this, with NIH of 0 and an asymptomatic patient, TPA and thrombectomy were not considered.  However, after returning the room, he began having reworsening of his aphasia.  His NIH increased to 2, however this appeared to be a disabling aphasia(though mildly) and therefore he was given IV TPA. A this point I asked who should make decisions if he was unable to and he indicated Carlos Brewer, his fiance.   Given these sympotms, and possible LVO, he was taken for CTP. By the end of his CTP, his aphasia had become quite severe with mild right drift and droop. His NIHSS was 6 at that time and I discussed thrombectomy with his   LKW: 1 AM tpa given?:  Yes IR Thrombectomy? Yes Modified Rankin Scale: 1-No significant post stroke disability and can perform usual duties with stroke symptoms NIHSS:    ROS: A complete ROS was performed and is negative except as noted in the HPI.   Past Medical History:  Diagnosis Date  . CHF (congestive heart failure) ([place])   . Cocaine abuse ([place])   . HFrEF (heart failure with reduced ejection fraction) ([place])    a. 10/2016 Echo: EF 15-20%, Gr2 DD, mildly dil LA/RA.  Marland Kitchen History of cardiac cath    a. 10/2016 Cath: LM nl, LAD  min irregs, LCX nl, RCA nl, EF 15%.  . Morbid obesity ([place])   . Myocarditis ([place])    a. 10/2016 Admit w/ CHF and trop elevation; b. 10/2016 Echo: EF 15-20%; c. 10/2016 Cath: Min irregs in LAD otw nl cors, EF 15%, glob HK.  Marland Kitchen NICM (nonischemic cardiomyopathy) ([place])    a. 10/2016 Echo: EF 15-20%, Gr2 DD; b. 10/2016 Cath: Nl cors.  . Palpitations   . Sleep apnea    USES CPAP  . Tobacco abuse      Family History  Problem Relation Age of Onset  . Healthy Mother   . Heart failure Father        EF is 35%     Social History:  reports that he has been smoking cigarettes. He has been smoking about 0.50 packs per day. He has never used smokeless tobacco. He reports current alcohol use. He reports previous drug use. Drug: Cocaine.   Prior to Admission medications   Medication Sig Start Date End Date Taking? Authorizing Provider  acetaminophen (TYLENOL) 500 MG tablet Take 2 tablets (1,000 mg total) by mouth every 6 (six) hours as needed. Patient not taking: Reported on 08/23/2018 10/12/16   Gerarda Fraction A, NP  albuterol (ACCUNEB) 1.25 MG/3ML nebulizer solution Take 3 mLs by nebulization 4 (four) times daily as needed for  wheezing or shortness of breath. 08/23/18   [provider]  albuterol (VENTOLIN HFA) 108 (90 Base) MCG/ACT inhaler Inhale 1-2 puffs into the lungs every 6 (six) hours as needed for wheezing or shortness of breath. 07/17/18   Florencia Reasons, MD  aspirin EC 81 MG EC tablet Take 1 tablet (81 mg total) by mouth daily. 10/30/16   Demetrios Loll, MD  atorvastatin (LIPITOR) 40 MG tablet Take 1 tablet (40 mg total) by mouth daily at 6 PM. 08/23/18   Bensimhon, Shaune Pascal, MD  bacitracin ointment Apply 1 application topically 2 (two) times daily. 08/28/18   Montine Circle, PA-C  carvedilol (COREG) 3.125 MG tablet Take 1 tablet (3.125 mg total) by mouth 2 (two) times daily with a meal. 08/23/18   Bensimhon, Shaune Pascal, MD  digoxin (LANOXIN) 0.125 MG tablet Take 1 tablet (0.125 mg total) by mouth daily.  08/23/18   Bensimhon, Shaune Pascal, MD  FLUoxetine (PROZAC) 20 MG capsule Take 1 capsule (20 mg total) by mouth daily. 12/19/18   Caren Griffins, MD  ibuprofen (ADVIL) 200 MG tablet Take 600 mg by mouth every 6 (six) hours as needed for moderate pain.    [provider]  insulin aspart (NOVOLOG) 100 UNIT/ML injection Inject 10 Units into the skin 3 (three) times daily before meals.    [provider]  insulin detemir (LEVEMIR) 100 UNIT/ML injection Inject 40 Units into the skin daily.    [provider]  metolazone (ZAROXOLYN) 2.5 MG tablet Take 1 tablet (2.5 mg total) by mouth daily as needed for up to 30 days. Take metolazone 2.5mg  x1 only for 5pound weight gain, please take potassium 32meq x1 with metolazone. Patient not taking: Reported on 07/23/2018 07/17/18 08/16/18  Florencia Reasons, MD  Multiple Vitamin (MULTI-VITAMIN) tablet Take 1 tablet by mouth daily.    [provider]  pantoprazole (PROTONIX) 40 MG tablet Take 40 mg by mouth daily.    [provider]  potassium chloride SA (K-DUR) 20 MEQ tablet Take 1 tablet (20 mEq total) by mouth daily. Can take extra one if taking metolazone. 08/23/18   Bensimhon, Shaune Pascal, MD  sacubitril-valsartan (ENTRESTO) 24-26 MG Take 1 tablet by mouth 2 (two) times daily. 08/23/18   Bensimhon, Shaune Pascal, MD  spironolactone (ALDACTONE) 25 MG tablet Take 1 tablet (25 mg total) by mouth daily. 08/23/18   Bensimhon, Shaune Pascal, MD  torsemide (DEMADEX) 20 MG tablet Take 2 tablets (40 mg total) by mouth 2 (two) times daily. Patient taking differently: Take 40 mg by mouth daily.  08/23/18   Bensimhon, Shaune Pascal, MD     Exam: Current vital signs: BP 110/60 (BP Location: Right Arm)   Pulse 100   Resp 17   SpO2 99%    Physical Exam  Constitutional: Appears well-developed and well-nourished.  Psych: Affect appropriate to situation Eyes: No scleral injection HENT: No OP obstrucion Head: Normocephalic.  Cardiovascular: Normal rate and regular  rhythm.  Respiratory: Effort normal and breath sounds normal to anterior ascultation GI: Soft.  No distension. There is no tenderness.  Skin: WDI  Neuro: Mental Status: Patient is awake, alert, oriented to person, place, month, year, and situation. Patient is able to give a clear and coherent history. No signs of aphasia or neglect Cranial Nerves: II: Visual Fields are full. Pupils are equal, round, and reactive to light.   III,IV, VI: EOMI without ptosis or diploplia.  V: Facial sensation is symmetric to temperature VII: Facial movement is symmetric.  VIII: hearing is intact to voice X: Uvula elevates symmetrically XI: Shoulder shrug is symmetric. XII: tongue is midline without atrophy or fasciculations.  Motor: Tone is normal. Bulk is normal. 5/5 strength was present in all four extremities.  Sensory: Sensation is symmetric to light touch and temperature in the arms and legs. Deep Tendon Reflexes: 2+ and symmetric in the biceps and patellae.  Plantars: Toes are downgoing bilaterally.  Cerebellar: FNF intact bilaterally   Subsequently, he had progressive worsening of his aphasia, initially mild and he was therefore taken for  subsequently becoming fairly severe.  I have reviewed labs in epic and the pertinent results are:  CMP-unremarkable CBC-normal  I have reviewed the images obtained: CT/CTA-MCA bifurcation occlusion with reconstitution of the sylvian fissure branch.  Primary Diagnosis:  Cerebral infarction due to embolism of  left middle cerebral artery.   Secondary Diagnosis: Chronic systolic (congestive) heart failure, Type 2 diabetes mellitus w/o complications and Obesity   Impression: 34 year old male with what is likely on embolic infarct of the left MCA due to low EF.  He was not considered for TPA or thrombectomy when he first arrived, but then subsequently mildly worsened and was therefore given IV TPA, and then further worsened and was considered for  thrombectomy.  He has continued to wax and wane, sometimes being able to name simple objects with ease and sometimes being completely unable to name any objects even once he has made before.  Due to this, I discussed consideration of mechanical thrombectomy with both the patient and with his fiance, they both agreed with proceeding.  Plan: Stroke- - HgbA1c, fasting lipid panel - MRI  of the brain without contrast - Frequent neuro checks - Echocardiogram - Prophylactic therapy-Antiplatelet med: Aspirin - dose 325mg  PO or 300mg  PR - Risk factor modification - Telemetry monitoring - PT consult, OT consult, Speech consult - Stroke team to follow  CHF Continue home digoxin Would consider CHF consult in the morning to see if other antihypertensives can be held or not.  Diabetes SSI    This patient is critically ill and at significant risk of neurological worsening, death and care requires constant monitoring of vital signs, hemodynamics,respiratory and cardiac monitoring, neurological assessment, discussion with family, other specialists and medical decision making of high complexity. I spent 90 minutes of neurocritical care time  in the care of  this patient. This was time spent independent of any time provided by nurse practitioner or PA.  Roland Rack, MD [facility] [phone]  If 7pm- 7am, please page neurology on call as listed in [place].

## 2019-04-22 NOTE — Progress Notes (Signed)
PT Cancellation Note  Patient Details Name: Carlos Brewer. MRN: [id number] DOB: 11/20/1985   Cancelled Treatment:    Reason Eval/Treat Not Completed: Medical issues which prohibited therapy.  Pt on bedrest post imminent femoral sheath removal.  Will see pt 2/6 as able. 04/29/2019  Ginger Carne., PT Acute Rehabilitation Services [phone]  (pager) [phone]  (office)   Tessie Fass Vidur Knust 05/10/2019, 5:33 PM

## 2019-04-22 NOTE — Progress Notes (Signed)
Spoke with RN Anderson Malta concerning PICC line placement today. I asked the nurse to get a PICC line order so that the patient could be added to the PICC list.

## 2019-04-22 NOTE — Progress Notes (Signed)
Patient ID: Desiderio Moskwa., male   DOB: [birth year], 34 y.o.   MRN: [id number] INR. 23 Y RT H M LSW Approx 1 am MRSS  0 New onset aphasia and rt sided weakness. CT brain NO ICH ASPECTS 10  CTA Lt MCA sup division mid M2 occlusiom CTP penumbra of 60 ml Endovascular treatment D/W patient and fiance in a 3 way call. Reasons,risks alternatives  Were reviewed. Risks of ICH of 10 %,worsening neuro deficit ,death inability to revascularize were discussed . Fiance expressed understanding and provided consent to proceed with the treatment. S.Emaleigh Guimond MD

## 2019-04-22 NOTE — Progress Notes (Addendum)
Referring Physician(s): Code Stroke- Greta Doom  Supervising Physician: Luanne Bras  Patient Status:  [facility] - In-pt  Chief Complaint: None- intubated with sedation  Subjective:  Acute CVA s/p cerebral arteriogram with emergent mechanical thrombectomy of left MCA superior division mid M2 segment achieving a TICI 3 revascularization 04/19/2019 by Dr. Estanislado Pandy. Patient laying in bed intubated with sedation. Accompanied by male at bedside. Per RN patient follows commands and can spontaneously move all extremities. Right groin incision c/d/i with sheath in place.   Allergies: Patient has no known allergies.  Medications: Prior to Admission medications   Medication Sig Start Date End Date Taking? Authorizing Provider  albuterol (ACCUNEB) 1.25 MG/3ML nebulizer solution Take 3 mLs by nebulization 4 (four) times daily as needed for wheezing or shortness of breath. 08/23/18   [provider]  albuterol (VENTOLIN HFA) 108 (90 Base) MCG/ACT inhaler Inhale 1-2 puffs into the lungs every 6 (six) hours as needed for wheezing or shortness of breath. 07/17/18   Florencia Reasons, MD  aspirin EC 81 MG EC tablet Take 1 tablet (81 mg total) by mouth daily. 10/30/16   Demetrios Loll, MD  atorvastatin (LIPITOR) 40 MG tablet Take 1 tablet (40 mg total) by mouth daily at 6 PM. 08/23/18   Bensimhon, Shaune Pascal, MD  bacitracin ointment Apply 1 application topically 2 (two) times daily. 08/28/18   Montine Circle, PA-C  carvedilol (COREG) 3.125 MG tablet Take 1 tablet (3.125 mg total) by mouth 2 (two) times daily with a meal. 08/23/18   Bensimhon, Shaune Pascal, MD  digoxin (LANOXIN) 0.125 MG tablet Take 1 tablet (0.125 mg total) by mouth daily. 08/23/18   Bensimhon, Shaune Pascal, MD  FLUoxetine (PROZAC) 20 MG capsule Take 1 capsule (20 mg total) by mouth daily. 12/19/18   Caren Griffins, MD  ibuprofen (ADVIL) 200 MG tablet Take 600 mg by mouth every 6 (six) hours as needed for moderate pain.    [provider]  insulin aspart (NOVOLOG) 100 UNIT/ML injection Inject 10 Units into the skin 3 (three) times daily before meals.    [provider]  insulin detemir (LEVEMIR) 100 UNIT/ML injection Inject 40 Units into the skin daily.    [provider]  metolazone (ZAROXOLYN) 2.5 MG tablet Take 1 tablet (2.5 mg total) by mouth daily as needed for up to 30 days. Take metolazone 2.5mg  x1 only for 5pound weight gain, please take potassium 4meq x1 with metolazone. Patient not taking: Reported on 07/23/2018 07/17/18 08/16/18  Florencia Reasons, MD  Multiple Vitamin (MULTI-VITAMIN) tablet Take 1 tablet by mouth daily.    [provider]  pantoprazole (PROTONIX) 40 MG tablet Take 40 mg by mouth daily.    [provider]  potassium chloride SA (K-DUR) 20 MEQ tablet Take 1 tablet (20 mEq total) by mouth daily. Can take extra one if taking metolazone. 08/23/18   Bensimhon, Shaune Pascal, MD  sacubitril-valsartan (ENTRESTO) 24-26 MG Take 1 tablet by mouth 2 (two) times daily. 08/23/18   Bensimhon, Shaune Pascal, MD  spironolactone (ALDACTONE) 25 MG tablet Take 1 tablet (25 mg total) by mouth daily. 08/23/18   Bensimhon, Shaune Pascal, MD  torsemide (DEMADEX) 20 MG tablet Take 2 tablets (40 mg total) by mouth 2 (two) times daily. Patient taking differently: Take 40 mg by mouth daily.  08/23/18   Bensimhon, Shaune Pascal, MD     Vital Signs: BP 115/82   Pulse 87   Temp 98.7 F (37.1 C) (Axillary)  Resp 18   Ht 6\' 1"  (1.854 m)   Wt 273 lb 13 oz (124.2 kg)   SpO2 93%   BMI 36.13 kg/m   Physical Exam Vitals and nursing note reviewed.  Constitutional:      General: He is not in acute distress.    Comments: Intubated with sedation.  Pulmonary:     Effort: Pulmonary effort is normal. No respiratory distress.     Comments: Intubated with sedation. Skin:    General: Skin is warm and dry.     Comments: Right groin incision soft with sheath intact, no active bleeding or hematoma.  Neurological:      Comments: Intubated with sedation. PERRL bilaterally. Distal pulses 1+ bilaterally.  Psychiatric:     Comments: Intubated with sedation.     Imaging: CT Code Stroke CTA Head W/WO contrast  Result Date: 04/19/2019 CLINICAL DATA:  34 year old male code stroke presentation. Suspicious left MCA sylvian fissure branch hyperdensity. History of cardiac issues with decreased cardiac ejection fraction. EXAM: CT ANGIOGRAPHY HEAD AND NECK TECHNIQUE: Multidetector CT imaging of the head and neck was performed using the standard protocol during bolus administration of intravenous contrast. Multiplanar CT image reconstructions and MIPs were obtained to evaluate the vascular anatomy. Carotid stenosis measurements (when applicable) are obtained utilizing NASCET criteria, using the distal internal carotid diameter as the denominator. CONTRAST:  75mL OMNIPAQUE IOHEXOL 350 MG/ML SOLN COMPARISON:  Plain head CT 0402 hours today. FINDINGS: CTA NECK Skeleton: Lower cervical spine endplate degeneration. Mild reversal of lordosis. Upper chest: Negative upper lungs and mediastinum. Other neck: Negative. Aortic arch: Suboptimal but adequate contrast bolus. Three vessel arch configuration with no arch atherosclerosis. Right carotid system: Negative right CCA and right carotid bifurcation. Tortuous but otherwise negative cervical right ICA. Left carotid system: Negative left CCA and left carotid bifurcation. Negative cervical left ICA. Vertebral arteries: The proximal right subclavian artery and cervical right vertebral artery appear normal. The right vertebral is dominant. Normal proximal left subclavian artery. The non dominant left vertebral artery is diminutive but remains patent to the cisterna magna. CTA HEAD Posterior circulation: The non dominant left vertebral artery seems to functionally terminates in PICA and/or an anterior spinal artery. The right vertebral artery supplies the basilar. No right vertebral or basilar artery  stenosis identified. But both vessels are somewhat diminutive. Both posterior communicating arteries are present. Patent SCA origins. Overall fetal type PCAs. Bilateral PCA branches are grossly normal. Anterior circulation: Both ICA siphons are patent without plaque or stenosis. Normal posterior communicating artery origins. Patent carotid termini. Normal MCA and ACA origins. Bilateral ACA branches are within normal limits. Right MCA M1 and bifurcation are patent without stenosis. Right MCA branches are within normal limits. The left MCA M1 is patent and gives off an anterior temporal artery on series 9, image 118. Then at the expected location of the left MCA bifurcation or trifurcation the vessel is occluded (series 11, image 20, series 5, image 57). There is reconstitution of the dominant posterior division branch. Venous sinuses: Early contrast timing, not evaluated. Anatomic variants: Dominant right vertebral artery, the left appears to terminates in PICA and/or the anterior spinal artery. Fetal type PCA origins. Review of the MIP images confirms the above findings IMPRESSION: 1. Positive for emergent large vessel occlusion at the Left MCA bifurcation. There is reconstitution of the dominant posterior division branch. This preliminary result was communicated to Dr. Leonel Ramsay at 0413 hours, and then we discussed by telephone. 2. Elsewhere negative CTA head and neck.  Dominant right vertebral artery which supplies the basilar. Fetal type PCA origins. Electronically Signed   By: Genevie Ann M.D.   On: 05/04/2019 04:22   CT Code Stroke CTA Neck W/WO contrast  Result Date: 04/30/2019 CLINICAL DATA:  34 year old male code stroke presentation. Suspicious left MCA sylvian fissure branch hyperdensity. History of cardiac issues with decreased cardiac ejection fraction. EXAM: CT ANGIOGRAPHY HEAD AND NECK TECHNIQUE: Multidetector CT imaging of the head and neck was performed using the standard protocol during bolus  administration of intravenous contrast. Multiplanar CT image reconstructions and MIPs were obtained to evaluate the vascular anatomy. Carotid stenosis measurements (when applicable) are obtained utilizing NASCET criteria, using the distal internal carotid diameter as the denominator. CONTRAST:  72mL OMNIPAQUE IOHEXOL 350 MG/ML SOLN COMPARISON:  Plain head CT 0402 hours today. FINDINGS: CTA NECK Skeleton: Lower cervical spine endplate degeneration. Mild reversal of lordosis. Upper chest: Negative upper lungs and mediastinum. Other neck: Negative. Aortic arch: Suboptimal but adequate contrast bolus. Three vessel arch configuration with no arch atherosclerosis. Right carotid system: Negative right CCA and right carotid bifurcation. Tortuous but otherwise negative cervical right ICA. Left carotid system: Negative left CCA and left carotid bifurcation. Negative cervical left ICA. Vertebral arteries: The proximal right subclavian artery and cervical right vertebral artery appear normal. The right vertebral is dominant. Normal proximal left subclavian artery. The non dominant left vertebral artery is diminutive but remains patent to the cisterna magna. CTA HEAD Posterior circulation: The non dominant left vertebral artery seems to functionally terminates in PICA and/or an anterior spinal artery. The right vertebral artery supplies the basilar. No right vertebral or basilar artery stenosis identified. But both vessels are somewhat diminutive. Both posterior communicating arteries are present. Patent SCA origins. Overall fetal type PCAs. Bilateral PCA branches are grossly normal. Anterior circulation: Both ICA siphons are patent without plaque or stenosis. Normal posterior communicating artery origins. Patent carotid termini. Normal MCA and ACA origins. Bilateral ACA branches are within normal limits. Right MCA M1 and bifurcation are patent without stenosis. Right MCA branches are within normal limits. The left MCA M1 is  patent and gives off an anterior temporal artery on series 9, image 118. Then at the expected location of the left MCA bifurcation or trifurcation the vessel is occluded (series 11, image 20, series 5, image 57). There is reconstitution of the dominant posterior division branch. Venous sinuses: Early contrast timing, not evaluated. Anatomic variants: Dominant right vertebral artery, the left appears to terminates in PICA and/or the anterior spinal artery. Fetal type PCA origins. Review of the MIP images confirms the above findings IMPRESSION: 1. Positive for emergent large vessel occlusion at the Left MCA bifurcation. There is reconstitution of the dominant posterior division branch. This preliminary result was communicated to Dr. Leonel Ramsay at 0413 hours, and then we discussed by telephone. 2. Elsewhere negative CTA head and neck. Dominant right vertebral artery which supplies the basilar. Fetal type PCA origins. Electronically Signed   By: Genevie Ann M.D.   On: 05/07/2019 04:22   CT CEREBRAL PERFUSION W CONTRAST  Result Date: 05/08/2019 CLINICAL DATA:  34 year old male code stroke presentation with left MCA bifurcation occlusion on CTA. EXAM: CT PERFUSION BRAIN TECHNIQUE: Multiphase CT imaging of the brain was performed following IV bolus contrast injection. Subsequent parametric perfusion maps were calculated using RAPID software. CONTRAST:  41mL OMNIPAQUE IOHEXOL 350 MG/ML SOLN COMPARISON:  CTA head and neck, plain CT earlier today. FINDINGS: CT Brain Perfusion Findings: CBF (<30%) Volume: 0 mL. There is  a small 6-8 mL volume of less stringent and CBF <38% detected. Perfusion (Tmax>6.0s) volume: 60 mL. Hypoperfusion index 0.5 with 32 mL of T-max greater than 10s detected. Mismatch Volume: 60 mL ASPECTS on noncontrast CT Head: 10 at 0402 hours today. Infarction Location:Left frontal operculum, deep white matter. IMPRESSION: 1. CTP detects evidence of Left MCA territory oligemia concordant with the CTA findings.  2. No infarct core detected with standard CBF <30%. 3. 60 mL of penumbra detected with standard T-max > 6s. Electronically Signed   By: Genevie Ann M.D.   On: 04/23/2019 05:06   DG Chest Port 1 View  Result Date: 04/20/2019 CLINICAL DATA:  Intubation. EXAM: PORTABLE CHEST 1 VIEW COMPARISON:  Chest x-ray 12/13/2018. FINDINGS: Endotracheal tube tip noted just above the carina. Proximal repositioning of 2-3 cm should be considered. Stable cardiomegaly. Diffuse bilateral pulmonary infiltrates/edema. Small left pleural effusion. No pneumothorax. IMPRESSION: 1. Endotracheal tube tip noted just above the carina. Proximal repositioning of 2-3 should be considered. 2. Cardiomegaly again noted. Diffuse bilateral pulmonary infiltrates/edema. Small left pleural effusion. These results will be called to the ordering clinician or representative by the Radiologist Assistant, and communication documented in the PACS or zVision Dashboard. Electronically Signed   By: Marcello Moores  Register   On: 04/30/2019 08:41   DG Abd Portable 1V  Result Date: 05/09/2019 CLINICAL DATA:  OG tube placement. EXAM: PORTABLE ABDOMEN - 1 VIEW COMPARISON:  None FINDINGS: OG tube tip is in the distal stomach. Bowel gas pattern is normal. Contrast seen in the renal collecting systems bilaterally. No bone abnormality. IMPRESSION: OG tube tip in the distal stomach. Electronically Signed   By: Lorriane Shire M.D.   On: 04/23/2019 10:07   CT HEAD CODE STROKE WO CONTRAST  Result Date: 04/24/2019 CLINICAL DATA:  Code stroke.  34 year old male EXAM: CT HEAD WITHOUT CONTRAST TECHNIQUE: Contiguous axial images were obtained from the base of the skull through the vertex without intravenous contrast. COMPARISON:  Head CT 08/28/2018. FINDINGS: Brain: No midline shift, mass effect, or evidence of intracranial mass lesion. No ventriculomegaly. No acute intracranial hemorrhage identified. Evidence of chronic cortical encephalomalacia in the right frontal operculum (series  3, image 23). No superimposed acute cortically based infarct identified. Vascular: Suspicious asymmetric hyperdensity of a left MCA branch in the sylvian fissure on series 3, image 16. Skull: Stable, negative. Sinuses/Orbits: Visualized paranasal sinuses and mastoids are stable and well pneumatized. Other: No acute orbit or scalp soft tissue findings. ASPECTS [facility] Stroke Program Early CT Score) Total score (0-10 with 10 being normal): 10 (left MCA territory, chronic cortical encephalomalacia right frontal operculum). IMPRESSION: 1. Asymmetric hyperdensity of a Left MCA branch in the Sylvian fissure suspicious for emergent large vessel occlusion in the setting. 2. No associated acute cortically based infarct, ASPECTS 10. No acute hemorrhage. 3. Chronic right frontal operculum encephalomalacia. 4. These results were communicated to Dr. Leonel Ramsay at 4:06 am on 05/11/2019 by text page via the [facility] messaging system. Electronically Signed   By: Genevie Ann M.D.   On: 05/14/2019 04:10    Labs:  CBC: Recent Labs    12/14/18 0715 12/14/18 0715 12/15/18 0405 12/15/18 0405 12/16/18 0242 04/27/2019 0355 04/28/2019 0430 04/30/2019 0905  WBC 4.2  --  4.9  --  8.2 7.2  --   --   HGB 12.8*   < > 12.8*   < > 13.3 14.9 13.9 14.6  HCT 37.6*   < > 38.2*   < > 39.2 42.8 41.0 43.0  PLT 224  --  238  --  268 243  --   --    < > = values in this interval not displayed.    COAGS: Recent Labs    08/27/18 2342 04/20/2019 0355  INR 0.9 1.0  APTT  --  24    BMP: Recent Labs    12/16/18 0242 12/16/18 0242 12/17/18 0130 12/17/18 0130 12/18/18 0427 05/13/2019 0355 04/27/2019 0430 05/02/2019 0905  NA 135   < > 136  137   < > 136 136 136 136  K 4.0   < > 3.9  3.8   < > 3.5 3.4* 3.4* 4.2  CL 100   < > 103  103  --  100 98 102  --   CO2 27  --  25  25  --  25 24  --   --   GLUCOSE 289*   < > 224*  233*  --  276* 153* 149*  --   BUN 30*   < > 24*  24*  --  27* 17 17  --   CALCIUM 9.2  --  9.0  9.0  --  8.8*  9.5  --   --   CREATININE 1.23   < > 1.06  1.05  --  1.09 1.17 1.10  --   GFRNONAA >60  --  >60  >60  --  >60 >60  --   --   GFRAA >60  --  >60  >60  --  >60 >60  --   --    < > = values in this interval not displayed.    LIVER FUNCTION TESTS: Recent Labs    12/16/18 0242 12/17/18 0130 12/18/18 0427 04/21/2019 0355  BILITOT 0.4 0.6 0.4 0.5  AST 19 15 23 21   ALT 37 32 32 23  ALKPHOS 83 83 82 95  PROT 7.0 6.6 6.8 6.6  ALBUMIN 3.5 3.5 3.6 3.4*    Assessment and Plan:  Acute CVA s/p cerebral arteriogram with emergent mechanical thrombectomy of left MCA superior division mid M2 segment achieving a TICI 3 revascularization 04/27/2019 by Dr. Estanislado Pandy. Patient's condition stable- remains intubated/sedated, per RN follows commands and spontaneously moves all extremities when sedation turned off. Right groin incision stable with sheath in place- plan for sheath removal today at approximately 1600, post-sheath removal orders in place. Further plans per neurology/CCM- appreciate and agree with management. NIR to follow.   ADDENDUM: Due to emergent code stroke procedure, Dr. Estanislado Pandy recommends sheath removal tomorrow AM. Anderson Malta, RN aware. NIR to follow.   Electronically Signed: Earley Abide, PA-C 04/28/2019, 2:41 PM   I spent a total of 25 Minutes at the the patient's bedside AND on the patient's hospital floor or unit, greater than 50% of which was counseling/coordinating care for left MCA superior division mid M2 segment occlusion s/p revascularization.

## 2019-04-23 ENCOUNTER — Inpatient Hospital Stay (HOSPITAL_COMMUNITY): Payer: No Typology Code available for payment source

## 2019-04-23 ENCOUNTER — Other Ambulatory Visit (HOSPITAL_COMMUNITY): Payer: No Typology Code available for payment source

## 2019-04-23 ENCOUNTER — Encounter (HOSPITAL_COMMUNITY): Payer: Self-pay | Admitting: Radiology

## 2019-04-23 DIAGNOSIS — I63312 Cerebral infarction due to thrombosis of left middle cerebral artery: Secondary | ICD-10-CM | POA: Diagnosis not present

## 2019-04-23 DIAGNOSIS — J9690 Respiratory failure, unspecified, unspecified whether with hypoxia or hypercapnia: Secondary | ICD-10-CM | POA: Diagnosis present

## 2019-04-23 DIAGNOSIS — I5043 Acute on chronic combined systolic (congestive) and diastolic (congestive) heart failure: Secondary | ICD-10-CM | POA: Diagnosis not present

## 2019-04-23 DIAGNOSIS — R579 Shock, unspecified: Secondary | ICD-10-CM | POA: Diagnosis not present

## 2019-04-23 DIAGNOSIS — I5022 Chronic systolic (congestive) heart failure: Secondary | ICD-10-CM | POA: Diagnosis not present

## 2019-04-23 DIAGNOSIS — K7200 Acute and subacute hepatic failure without coma: Secondary | ICD-10-CM | POA: Diagnosis not present

## 2019-04-23 DIAGNOSIS — J988 Other specified respiratory disorders: Secondary | ICD-10-CM | POA: Diagnosis not present

## 2019-04-23 DIAGNOSIS — I6602 Occlusion and stenosis of left middle cerebral artery: Secondary | ICD-10-CM | POA: Diagnosis not present

## 2019-04-23 HISTORY — PX: IR PATIENT EVAL TECH 0-60 MINS: IMG5564

## 2019-04-23 LAB — CBC WITH DIFFERENTIAL/PLATELET
Abs Immature Granulocytes: 0.25 10*3/uL — ABNORMAL HIGH (ref 0.00–0.07)
Abs Immature Granulocytes: 0.11 10*3/uL — ABNORMAL HIGH (ref 0.00–0.07)
Basophils Absolute: 0 10*3/uL (ref 0.0–0.1)
Basophils Absolute: 0 10*3/uL (ref 0.0–0.1)
Basophils Relative: 0 %
Basophils Relative: 0 %
Eosinophils Absolute: 0 10*3/uL (ref 0.0–0.5)
Eosinophils Absolute: 0 10*3/uL (ref 0.0–0.5)
Eosinophils Relative: 0 %
Eosinophils Relative: 0 %
HCT: 45.2 % (ref 39.0–52.0)
HCT: 44 % (ref 39.0–52.0)
Hemoglobin: 15.4 g/dL (ref 13.0–17.0)
Hemoglobin: 14.6 g/dL (ref 13.0–17.0)
Immature Granulocytes: 1 %
Immature Granulocytes: 1 %
Lymphocytes Relative: 3 %
Lymphocytes Relative: 11 %
Lymphs Abs: 0.8 10*3/uL (ref 0.7–4.0)
Lymphs Abs: 1.8 10*3/uL (ref 0.7–4.0)
MCH: 30.9 pg (ref 26.0–34.0)
MCH: 30.7 pg (ref 26.0–34.0)
MCHC: 34.1 g/dL (ref 30.0–36.0)
MCHC: 33.2 g/dL (ref 30.0–36.0)
MCV: 90.8 fL (ref 80.0–100.0)
MCV: 92.6 fL (ref 80.0–100.0)
Monocytes Absolute: 1.6 10*3/uL — ABNORMAL HIGH (ref 0.1–1.0)
Monocytes Absolute: 0.8 10*3/uL (ref 0.1–1.0)
Monocytes Relative: 8 %
Monocytes Relative: 5 %
Neutro Abs: 19.2 10*3/uL — ABNORMAL HIGH (ref 1.7–7.7)
Neutro Abs: 13.5 10*3/uL — ABNORMAL HIGH (ref 1.7–7.7)
Neutrophils Relative %: 88 %
Neutrophils Relative %: 83 %
Platelets: 171 10*3/uL (ref 150–400)
Platelets: 145 10*3/uL — ABNORMAL LOW (ref 150–400)
RBC: 4.98 MIL/uL (ref 4.22–5.81)
RBC: 4.75 MIL/uL (ref 4.22–5.81)
RDW: 12.2 % (ref 11.5–15.5)
RDW: 12.2 % (ref 11.5–15.5)
WBC: 21.9 10*3/uL — ABNORMAL HIGH (ref 4.0–10.5)
WBC: 16.2 10*3/uL — ABNORMAL HIGH (ref 4.0–10.5)
nRBC: 0 % (ref 0.0–0.2)
nRBC: 0 % (ref 0.0–0.2)

## 2019-04-23 LAB — COMPREHENSIVE METABOLIC PANEL
ALT: 11085 U/L — ABNORMAL HIGH (ref 0–44)
ALT: 10132 U/L — ABNORMAL HIGH (ref 0–44)
AST: >10000 U/L — ABNORMAL HIGH (ref 15–41)
AST: >10000 U/L — ABNORMAL HIGH (ref 15–41)
Albumin: 2.9 g/dL — ABNORMAL LOW (ref 3.5–5.0)
Albumin: 2.8 g/dL — ABNORMAL LOW (ref 3.5–5.0)
Alkaline Phosphatase: 88 U/L (ref 38–126)
Alkaline Phosphatase: 97 U/L (ref 38–126)
Anion gap: 15 (ref 5–15)
Anion gap: 18 — ABNORMAL HIGH (ref 5–15)
BUN: 23 mg/dL — ABNORMAL HIGH (ref 6–20)
BUN: 28 mg/dL — ABNORMAL HIGH (ref 6–20)
CO2: 15 mmol/L — ABNORMAL LOW (ref 22–32)
CO2: 13 mmol/L — ABNORMAL LOW (ref 22–32)
Calcium: 7.7 mg/dL — ABNORMAL LOW (ref 8.9–10.3)
Calcium: 7.5 mg/dL — ABNORMAL LOW (ref 8.9–10.3)
Chloride: 109 mmol/L (ref 98–111)
Chloride: 109 mmol/L (ref 98–111)
Creatinine, Ser: 2.64 mg/dL — ABNORMAL HIGH (ref 0.61–1.24)
Creatinine, Ser: 4.21 mg/dL — ABNORMAL HIGH (ref 0.61–1.24)
GFR calc Af Amer: 35 mL/min — ABNORMAL LOW (ref >60)
GFR calc Af Amer: 20 mL/min — ABNORMAL LOW (ref >60)
GFR calc non Af Amer: 30 mL/min — ABNORMAL LOW (ref >60)
GFR calc non Af Amer: 17 mL/min — ABNORMAL LOW (ref >60)
Glucose, Bld: 220 mg/dL — ABNORMAL HIGH (ref 70–99)
Glucose, Bld: 229 mg/dL — ABNORMAL HIGH (ref 70–99)
Potassium: 5 mmol/L (ref 3.5–5.1)
Potassium: 4.6 mmol/L (ref 3.5–5.1)
Sodium: 139 mmol/L (ref 135–145)
Sodium: 140 mmol/L (ref 135–145)
Total Bilirubin: 1.6 mg/dL — ABNORMAL HIGH (ref 0.3–1.2)
Total Bilirubin: 1.3 mg/dL — ABNORMAL HIGH (ref 0.3–1.2)
Total Protein: 6.1 g/dL — ABNORMAL LOW (ref 6.5–8.1)
Total Protein: 5.6 g/dL — ABNORMAL LOW (ref 6.5–8.1)

## 2019-04-23 LAB — URINALYSIS, ROUTINE W REFLEX MICROSCOPIC
Bilirubin Urine: NEGATIVE
Glucose, UA: 50 mg/dL — AB
Ketones, ur: NEGATIVE mg/dL
Leukocytes,Ua: NEGATIVE
Nitrite: NEGATIVE
Protein, ur: >=300 mg/dL — AB
RBC / HPF: >50 RBC/hpf — ABNORMAL HIGH (ref 0–5)
Specific Gravity, Urine: 1.025 (ref 1.005–1.030)
pH: 6 (ref 5.0–8.0)

## 2019-04-23 LAB — RAPID URINE DRUG SCREEN, HOSP PERFORMED
Amphetamines: NOT DETECTED
Barbiturates: NOT DETECTED
Benzodiazepines: POSITIVE — AB
Cocaine: NOT DETECTED
Opiates: NOT DETECTED
Tetrahydrocannabinol: NOT DETECTED

## 2019-04-23 LAB — POCT I-STAT 7, (LYTES, BLD GAS, ICA,H+H)
Acid-base deficit: 11 mmol/L — ABNORMAL HIGH (ref 0.0–2.0)
Acid-base deficit: 15 mmol/L — ABNORMAL HIGH (ref 0.0–2.0)
Acid-base deficit: 7 mmol/L — ABNORMAL HIGH (ref 0.0–2.0)
Bicarbonate: 13.5 mmol/L — ABNORMAL LOW (ref 20.0–28.0)
Bicarbonate: 14.7 mmol/L — ABNORMAL LOW (ref 20.0–28.0)
Bicarbonate: 17.8 mmol/L — ABNORMAL LOW (ref 20.0–28.0)
Calcium, Ion: 0.93 mmol/L — ABNORMAL LOW (ref 1.15–1.40)
Calcium, Ion: 0.99 mmol/L — ABNORMAL LOW (ref 1.15–1.40)
Calcium, Ion: 1.08 mmol/L — ABNORMAL LOW (ref 1.15–1.40)
HCT: 40 % (ref 39.0–52.0)
HCT: 41 % (ref 39.0–52.0)
HCT: 48 % (ref 39.0–52.0)
Hemoglobin: 13.6 g/dL (ref 13.0–17.0)
Hemoglobin: 13.9 g/dL (ref 13.0–17.0)
Hemoglobin: 16.3 g/dL (ref 13.0–17.0)
O2 Saturation: 70 %
O2 Saturation: 93 %
O2 Saturation: 98 %
Patient temperature: 102.5
Patient temperature: 98.6
Patient temperature: 99.1
Potassium: 4.7 mmol/L (ref 3.5–5.1)
Potassium: 4.9 mmol/L (ref 3.5–5.1)
Potassium: 5.1 mmol/L (ref 3.5–5.1)
Sodium: 139 mmol/L (ref 135–145)
Sodium: 139 mmol/L (ref 135–145)
Sodium: 140 mmol/L (ref 135–145)
TCO2: 15 mmol/L — ABNORMAL LOW (ref 22–32)
TCO2: 16 mmol/L — ABNORMAL LOW (ref 22–32)
TCO2: 19 mmol/L — ABNORMAL LOW (ref 22–32)
pCO2 arterial: 32.5 mmHg (ref 32.0–48.0)
pCO2 arterial: 38.6 mmHg (ref 32.0–48.0)
pCO2 arterial: 41.5 mmHg (ref 32.0–48.0)
pH, Arterial: 7.123 — CL (ref 7.350–7.450)
pH, Arterial: 7.263 — ABNORMAL LOW (ref 7.350–7.450)
pH, Arterial: 7.283 — ABNORMAL LOW (ref 7.350–7.450)
pO2, Arterial: 133 mmHg — ABNORMAL HIGH (ref 83.0–108.0)
pO2, Arterial: 46 mmHg — ABNORMAL LOW (ref 83.0–108.0)
pO2, Arterial: 74 mmHg — ABNORMAL LOW (ref 83.0–108.0)

## 2019-04-23 LAB — LACTIC ACID, PLASMA
Lactic Acid, Venous: 6.5 mmol/L (ref 0.5–1.9)
Lactic Acid, Venous: 6.8 mmol/L (ref 0.5–1.9)

## 2019-04-23 LAB — LIPASE, BLOOD: Lipase: 85 U/L — ABNORMAL HIGH (ref 11–51)

## 2019-04-23 LAB — LIPID PANEL
Cholesterol: 265 mg/dL — ABNORMAL HIGH (ref 0–200)
HDL: 24 mg/dL — ABNORMAL LOW (ref >40)
LDL Cholesterol: 203 mg/dL — ABNORMAL HIGH (ref 0–99)
Total CHOL/HDL Ratio: 11 RATIO
Triglycerides: 191 mg/dL — ABNORMAL HIGH (ref <150)
VLDL: 38 mg/dL (ref 0–40)

## 2019-04-23 LAB — POCT I-STAT EG7
Acid-base deficit: 12 mmol/L — ABNORMAL HIGH (ref 0.0–2.0)
Bicarbonate: 15.2 mmol/L — ABNORMAL LOW (ref 20.0–28.0)
Calcium, Ion: 1.07 mmol/L — ABNORMAL LOW (ref 1.15–1.40)
HCT: 41 % (ref 39.0–52.0)
Hemoglobin: 13.9 g/dL (ref 13.0–17.0)
O2 Saturation: 79 %
Patient temperature: 38
Potassium: 5 mmol/L (ref 3.5–5.1)
Sodium: 139 mmol/L (ref 135–145)
TCO2: 16 mmol/L — ABNORMAL LOW (ref 22–32)
pCO2, Ven: 39.5 mmHg — ABNORMAL LOW (ref 44.0–60.0)
pH, Ven: 7.2 — ABNORMAL LOW (ref 7.250–7.430)
pO2, Ven: 55 mmHg — ABNORMAL HIGH (ref 32.0–45.0)

## 2019-04-23 LAB — MAGNESIUM: Magnesium: 1.9 mg/dL (ref 1.7–2.4)

## 2019-04-23 LAB — GLUCOSE, CAPILLARY
Glucose-Capillary: 197 mg/dL — ABNORMAL HIGH (ref 70–99)
Glucose-Capillary: 179 mg/dL — ABNORMAL HIGH (ref 70–99)
Glucose-Capillary: 182 mg/dL — ABNORMAL HIGH (ref 70–99)
Glucose-Capillary: 259 mg/dL — ABNORMAL HIGH (ref 70–99)
Glucose-Capillary: 243 mg/dL — ABNORMAL HIGH (ref 70–99)
Glucose-Capillary: 283 mg/dL — ABNORMAL HIGH (ref 70–99)

## 2019-04-23 LAB — NA AND K (SODIUM & POTASSIUM), RAND UR
Potassium Urine: 96 mmol/L
Sodium, Ur: 17 mmol/L

## 2019-04-23 LAB — HEMOGLOBIN A1C
Hgb A1c MFr Bld: 6.5 % — ABNORMAL HIGH (ref 4.8–5.6)
Mean Plasma Glucose: 139.85 mg/dL

## 2019-04-23 LAB — CK: Total CK: 602 U/L — ABNORMAL HIGH (ref 49–397)

## 2019-04-23 LAB — FIBRINOGEN: Fibrinogen: 117 mg/dL — ABNORMAL LOW (ref 210–475)

## 2019-04-23 LAB — POTASSIUM
Potassium: 5 mmol/L (ref 3.5–5.1)
Potassium: 5 mmol/L (ref 3.5–5.1)
Potassium: 5.8 mmol/L — ABNORMAL HIGH (ref 3.5–5.1)
Potassium: 5.9 mmol/L — ABNORMAL HIGH (ref 3.5–5.1)
Potassium: 5 mmol/L (ref 3.5–5.1)

## 2019-04-23 LAB — TROPONIN I (HIGH SENSITIVITY)
Troponin I (High Sensitivity): 16274 ng/L (ref <18)
Troponin I (High Sensitivity): 14226 ng/L (ref <18)

## 2019-04-23 LAB — ABO/RH: ABO/RH(D): B POS

## 2019-04-23 LAB — AMMONIA: Ammonia: 68 umol/L — ABNORMAL HIGH (ref 9–35)

## 2019-04-23 LAB — PROTIME-INR
INR: 3.1 — ABNORMAL HIGH (ref 0.8–1.2)
Prothrombin Time: 31.7 seconds — ABNORMAL HIGH (ref 11.4–15.2)

## 2019-04-23 LAB — DIGOXIN LEVEL: Digoxin Level: 0.4 ng/mL — ABNORMAL LOW (ref 0.8–2.0)

## 2019-04-23 LAB — PROCALCITONIN: Procalcitonin: 8.21 ng/mL

## 2019-04-23 LAB — BRAIN NATRIURETIC PEPTIDE: B Natriuretic Peptide: 2909.9 pg/mL — ABNORMAL HIGH (ref 0.0–100.0)

## 2019-04-23 MED ORDER — EPINEPHRINE PF 1 MG/ML IJ SOLN
0.5000 ug/min | INTRAVENOUS | Status: DC
  Administered 2019-04-23: 13 ug/min via INTRAVENOUS
  Administered 2019-04-23: 1 ug/min via INTRAVENOUS
  Filled 2019-04-23 (×4): qty 4

## 2019-04-23 MED ORDER — SODIUM CHLORIDE 0.9 % IV SOLN
1.0000 g | Freq: Three times a day (TID) | INTRAVENOUS | Status: AC
  Administered 2019-04-24 – 2019-04-29 (×18): 1 g via INTRAVENOUS
  Filled 2019-04-23 (×18): qty 1

## 2019-04-23 MED ORDER — HYDROCORTISONE NA SUCCINATE PF 100 MG IJ SOLR
50.0000 mg | Freq: Four times a day (QID) | INTRAMUSCULAR | Status: DC
  Administered 2019-04-23 – 2019-04-25 (×8): 50 mg via INTRAVENOUS
  Filled 2019-04-23 (×8): qty 2

## 2019-04-23 MED ORDER — FENTANYL 2500MCG IN NS 250ML (10MCG/ML) PREMIX INFUSION
50.0000 ug/h | INTRAVENOUS | Status: DC
  Administered 2019-04-23: 09:00:00 50 ug/h via INTRAVENOUS
  Administered 2019-04-23: 21:00:00 200 ug/h via INTRAVENOUS
  Administered 2019-04-25: 04:00:00 100 ug/h via INTRAVENOUS
  Filled 2019-04-23 (×3): qty 250

## 2019-04-23 MED ORDER — SODIUM BICARBONATE 8.4 % IV SOLN
100.0000 meq | Freq: Two times a day (BID) | INTRAVENOUS | Status: AC
  Administered 2019-04-23 (×2): 100 meq via INTRAVENOUS
  Filled 2019-04-23: qty 50
  Filled 2019-04-23: qty 100

## 2019-04-23 MED ORDER — PRISMASOL BGK 4/2.5 32-4-2.5 MEQ/L REPLACEMENT SOLN
Status: DC
  Filled 2019-04-23: qty 5000

## 2019-04-23 MED ORDER — FUROSEMIDE 10 MG/ML IJ SOLN
120.0000 mg | Freq: Four times a day (QID) | INTRAVENOUS | Status: DC
  Administered 2019-04-23: 15:00:00 120 mg via INTRAVENOUS
  Filled 2019-04-23 (×4): qty 12

## 2019-04-23 MED ORDER — FENTANYL CITRATE (PF) 100 MCG/2ML IJ SOLN: 50.0000 ug | Freq: Once | INTRAMUSCULAR | Status: DC

## 2019-04-23 MED ORDER — STERILE WATER FOR INJECTION IV SOLN
INTRAVENOUS | Status: DC
  Filled 2019-04-23 (×4): qty 150

## 2019-04-23 MED ORDER — ATORVASTATIN CALCIUM 80 MG PO TABS: 80.0000 mg | ORAL_TABLET | Freq: Every day | ORAL | Status: DC

## 2019-04-23 MED ORDER — FUROSEMIDE 10 MG/ML IJ SOLN
60.0000 mg | Freq: Once | INTRAMUSCULAR | Status: AC
  Administered 2019-04-23: 12:00:00 60 mg via INTRAVENOUS
  Filled 2019-04-23: qty 6

## 2019-04-23 MED ORDER — PRISMASOL BGK 4/2.5 32-4-2.5 MEQ/L IV SOLN
INTRAVENOUS | Status: DC
  Filled 2019-04-23 (×113): qty 5000

## 2019-04-23 MED ORDER — FUROSEMIDE 10 MG/ML IJ SOLN
10.0000 mg/h | INTRAVENOUS | Status: DC
  Filled 2019-04-23: qty 25

## 2019-04-23 MED ORDER — DEXTROSE 5 % IV SOLN
500.0000 mg | Freq: Once | INTRAVENOUS | Status: AC
  Administered 2019-04-23: 14:00:00 500 mg via INTRAVENOUS
  Filled 2019-04-23: qty 500

## 2019-04-23 MED ORDER — DEXTROSE 5 % IV SOLN
500.0000 mg | Freq: Once | INTRAVENOUS | Status: AC
  Filled 2019-04-23: qty 500

## 2019-04-23 MED ORDER — SODIUM BICARBONATE 8.4 % IV SOLN
25.0000 meq | Freq: Once | INTRAVENOUS | Status: AC
  Administered 2019-04-23: 25 meq via INTRAVENOUS
  Filled 2019-04-23: qty 50

## 2019-04-23 MED ORDER — SODIUM CHLORIDE 0.9% IV SOLUTION: Freq: Once | INTRAVENOUS | Status: AC

## 2019-04-23 MED ORDER — PIPERACILLIN-TAZOBACTAM 3.375 G IVPB
3.3750 g | Freq: Three times a day (TID) | INTRAVENOUS | Status: DC
  Administered 2019-04-23: 3.375 g via INTRAVENOUS
  Filled 2019-04-23 (×2): qty 50

## 2019-04-23 MED ORDER — VASOPRESSIN 20 UNIT/ML IV SOLN
0.0300 [IU]/min | INTRAVENOUS | Status: DC
  Administered 2019-04-23 – 2019-04-24 (×2): 0.03 [IU]/min via INTRAVENOUS
  Filled 2019-04-23 (×4): qty 2

## 2019-04-23 MED ORDER — FENTANYL BOLUS VIA INFUSION
50.0000 ug | INTRAVENOUS | Status: DC | PRN
  Administered 2019-04-23: 20:00:00 50 ug via INTRAVENOUS
  Filled 2019-04-23: qty 50

## 2019-04-23 MED ORDER — HEPARIN SODIUM (PORCINE) 1000 UNIT/ML DIALYSIS
1000.0000 [IU] | INTRAMUSCULAR | Status: DC | PRN
  Administered 2019-04-28: 2400 [IU] via INTRAVENOUS_CENTRAL
  Filled 2019-04-23: qty 3
  Filled 2019-04-23 (×2): qty 6

## 2019-04-23 MED ORDER — FUROSEMIDE 10 MG/ML IJ SOLN
20.0000 mg | Freq: Once | INTRAMUSCULAR | Status: AC
  Administered 2019-04-23: 10:00:00 20 mg via INTRAVENOUS
  Filled 2019-04-23: qty 2

## 2019-04-23 MED ORDER — SODIUM CHLORIDE 0.9 % IV SOLN
INTRAVENOUS | Status: DC | PRN
  Administered 2019-04-23 (×2): 500 mL via INTRAVENOUS
  Administered 2019-04-27: 250 mL via INTRAVENOUS
  Administered 2019-05-24: 500 mL via INTRAVENOUS
  Administered 2019-05-27: 250 mL via INTRAVENOUS
  Administered 2019-06-01: 500 mL via INTRAVENOUS
  Administered 2019-06-03: 1000 mL via INTRAVENOUS
  Administered 2019-06-15: 250 mL via INTRAVENOUS
  Administered 2019-06-16: 500 mL via INTRAVENOUS

## 2019-04-23 MED ORDER — PRISMASOL BGK 4/2.5 32-4-2.5 MEQ/L REPLACEMENT SOLN
Status: DC
  Filled 2019-04-23 (×2): qty 5000

## 2019-04-23 MED ORDER — MIDAZOLAM 50MG/50ML (1MG/ML) PREMIX INFUSION
0.5000 mg/h | INTRAVENOUS | Status: DC
  Filled 2019-04-23 (×2): qty 50

## 2019-04-23 MED ORDER — STERILE WATER FOR INJECTION IV SOLN
INTRAVENOUS | Status: DC
  Filled 2019-04-23 (×13): qty 150

## 2019-04-23 MED ORDER — HEPARIN (PORCINE) 2000 UNITS/L FOR CRRT
INTRAVENOUS_CENTRAL | Status: DC | PRN
  Filled 2019-04-23 (×6): qty 1000

## 2019-04-23 MED ORDER — VANCOMYCIN HCL 1250 MG/250ML IV SOLN
1250.0000 mg | INTRAVENOUS | Status: DC
  Administered 2019-04-23 – 2019-04-25 (×3): 1250 mg via INTRAVENOUS
  Filled 2019-04-23 (×4): qty 250

## 2019-04-23 MED ORDER — SODIUM CHLORIDE 0.9 % IV SOLN
2.0000 g | Freq: Three times a day (TID) | INTRAVENOUS | Status: AC
  Administered 2019-04-23: 2 g via INTRAVENOUS
  Filled 2019-04-23 (×2): qty 2

## 2019-04-23 NOTE — Progress Notes (Signed)
Pharmacy Antibiotic Note  Carlos Brewer. is a 34 y.o. male admitted on 04/23/2019 with sepsis.  Pharmacy has been consulted for vanc/meropenem dosing.  Just started on vanc/piptazo today but patient is worsening and Dr. Duwayne Heck wanted to broaden to meropenem. Spoke with Elink Dr. Theadora Rama who confirms that was the sign out he received. Pt also started on CRRT for acidosis and hyperkalemia, tolerating well so far.    Plan: Give meropenem 2g once then 1g Q8 hr Stop pip/tazo Continue vancomycin 1250mg  Q24hr  F/u AM random vancomycin level, adjust dose as needed  Monitor cultures, clinical status, renal fx, dialysis plan Narrow abx as able and f/u duration    Height: 6\' 1"  (185.4 cm) Weight: 273 lb 13 oz (124.2 kg) IBW/kg (Calculated) : 79.9  Temp (24hrs), Avg:101.2 F (38.4 C), Min:94.5 F (34.7 C), Max:103.2 F (39.6 C)  Recent Labs  Lab 05/12/2019 0355 04/26/2019 0430 04/23/19 0002 04/23/19 0208 04/23/19 0945 04/23/19 1122  WBC 7.2  --   --  21.9* 16.2*  --   CREATININE 1.17 1.10 2.64*  --  4.21*  --   LATICACIDVEN  --   --   --   --  6.5* 6.8*    Estimated Creatinine Clearance: 34.1 mL/min (A) (by C-G formula based on SCr of 4.21 mg/dL (H)).    No Known Allergies  Antimicrobials this admission: Vanc 2/6>> Meropenem 2/6 >> Zosyn 2/6  Microbiology results: 2/6 BCx: pend  2/6 UCx: pend  2/6 Sputum: few GPC, few GNC, rare GPR  2/5 MRSA PCR: neg  Benetta Spar, PharmD, BCPS, BCCP Clinical Pharmacist  Please check AMION for all [place] phone numbers After 10:00 PM, call [place] [phone]

## 2019-04-23 NOTE — Progress Notes (Signed)
Referring Physician(s): Code Stroke- Greta Doom  Supervising Physician: Luanne Bras  Patient Status:  [facility] - In-pt  Chief Complaint: None- intubated with sedation  Subjective:  Acute CVA s/p cerebral arteriogram with emergent mechanical thrombectomy of left MCA superior division mid M2 segment achieving a TICI 3 revascularization 04/24/2019 by Dr. Estanislado Pandy. Patient laying in bed intubated with sedation. Accompanied by significant other at bedside. No spontaneous movements of extremities however heavily sedated. Right groin incision c/d/i s/p sheath removal.   Allergies: Patient has no known allergies.  Medications: Prior to Admission medications   Medication Sig Start Date End Date Taking? Authorizing Provider  albuterol (ACCUNEB) 1.25 MG/3ML nebulizer solution Take 3 mLs by nebulization 4 (four) times daily as needed for wheezing or shortness of breath. 08/23/18  Yes [provider]  albuterol (VENTOLIN HFA) 108 (90 Base) MCG/ACT inhaler Inhale 1-2 puffs into the lungs every 6 (six) hours as needed for wheezing or shortness of breath. 07/17/18  Yes Florencia Reasons, MD  aspirin EC 81 MG EC tablet Take 1 tablet (81 mg total) by mouth daily. Patient taking differently: Take 81 mg by mouth at bedtime.  10/30/16  Yes Demetrios Loll, MD  atorvastatin (LIPITOR) 40 MG tablet Take 1 tablet (40 mg total) by mouth daily at 6 PM. 08/23/18  Yes Bensimhon, Shaune Pascal, MD  bacitracin ointment Apply 1 application topically 2 (two) times daily. Patient taking differently: Apply 1 application topically 2 (two) times daily as needed for wound care.  08/28/18  Yes Montine Circle, PA-C  carvedilol (COREG) 3.125 MG tablet Take 1 tablet (3.125 mg total) by mouth 2 (two) times daily with a meal. 08/23/18  Yes Bensimhon, Shaune Pascal, MD  digoxin (LANOXIN) 0.125 MG tablet Take 1 tablet (0.125 mg total) by mouth daily. 08/23/18  Yes Bensimhon, Shaune Pascal, MD  FLUoxetine (PROZAC) 20 MG capsule Take 1  capsule (20 mg total) by mouth daily. 12/19/18  Yes Gherghe, Vella Redhead, MD  insulin aspart (NOVOLOG) 100 UNIT/ML injection Inject 10 Units into the skin 3 (three) times daily before meals.   Yes [provider]  insulin detemir (LEVEMIR) 100 UNIT/ML injection Inject 40 Units into the skin at bedtime.    Yes [provider]  metolazone (ZAROXOLYN) 2.5 MG tablet Take 1 tablet (2.5 mg total) by mouth daily as needed for up to 30 days. Take metolazone 2.5mg  x1 only for 5pound weight gain, please take potassium 64meq x1 with metolazone. Patient taking differently: Take 2.5 mg by mouth daily as needed (for 5 lb weight gain (take additional potassium 20 meq with each dose).  07/17/18 06/15/19 Yes Florencia Reasons, MD  Multiple Vitamin (MULTIVITAMIN WITH MINERALS) TABS tablet Take 1 tablet by mouth daily with lunch.   Yes [provider]  pantoprazole (PROTONIX) 40 MG tablet Take 40 mg by mouth daily as needed (acid reflux/indigestion).    Yes [provider]  potassium chloride SA (K-DUR) 20 MEQ tablet Take 1 tablet (20 mEq total) by mouth daily. Can take extra one if taking metolazone. Patient taking differently: Take 20 mEq by mouth See admin instructions. Take one tablet (20 meq) by mouth every morning, also take an extra tablet (20 meq) with each dose of metolazone 08/23/18  Yes Bensimhon, Shaune Pascal, MD  sacubitril-valsartan (ENTRESTO) 24-26 MG Take 1 tablet by mouth 2 (two) times daily. 08/23/18  Yes Bensimhon, Shaune Pascal, MD  spironolactone (ALDACTONE) 25 MG tablet Take 1 tablet (25 mg total) by mouth daily. Patient taking  differently: Take 25 mg by mouth at bedtime.  08/23/18  Yes Bensimhon, Shaune Pascal, MD  torsemide (DEMADEX) 20 MG tablet Take 2 tablets (40 mg total) by mouth 2 (two) times daily. Patient taking differently: Take 40 mg by mouth daily.  08/23/18  Yes Bensimhon, Shaune Pascal, MD  Vitamin D, Ergocalciferol, (DRISDOL) 1.25 MG (50000 UNIT) CAPS capsule Take 50,000 Units by mouth every  Thursday.   Yes [provider]     Vital Signs: BP (!) 89/48   Pulse (!) 102   Temp (!) 102.2 F (39 C)   Resp (!) 30   Ht 6\' 1"  (1.854 m)   Wt 273 lb 13 oz (124.2 kg)   SpO2 100%   BMI 36.13 kg/m   Physical Exam Vitals and nursing note reviewed.  Constitutional:      Appearance: He is ill-appearing.     Comments: Intubated with sedation.  Pulmonary:     Effort: Pulmonary effort is normal. No respiratory distress.     Comments: Intubated with sedation. Skin:    General: Skin is warm and dry.     Comments: Right groin incision soft without active bleeding or hematoma.  Neurological:     Comments: Intubated with sedation. PERRL bilaterally. No spontaneous movements of extremities. DPs palpable bilaterally with Doppler.  Psychiatric:     Comments: Intubated with sedation.     Imaging: CT Code Stroke CTA Head W/WO contrast  Result Date: 05/04/2019 CLINICAL DATA:  34 year old male code stroke presentation. Suspicious left MCA sylvian fissure branch hyperdensity. History of cardiac issues with decreased cardiac ejection fraction. EXAM: CT ANGIOGRAPHY HEAD AND NECK TECHNIQUE: Multidetector CT imaging of the head and neck was performed using the standard protocol during bolus administration of intravenous contrast. Multiplanar CT image reconstructions and MIPs were obtained to evaluate the vascular anatomy. Carotid stenosis measurements (when applicable) are obtained utilizing NASCET criteria, using the distal internal carotid diameter as the denominator. CONTRAST:  31mL OMNIPAQUE IOHEXOL 350 MG/ML SOLN COMPARISON:  Plain head CT 0402 hours today. FINDINGS: CTA NECK Skeleton: Lower cervical spine endplate degeneration. Mild reversal of lordosis. Upper chest: Negative upper lungs and mediastinum. Other neck: Negative. Aortic arch: Suboptimal but adequate contrast bolus. Three vessel arch configuration with no arch atherosclerosis. Right carotid system: Negative right CCA and  right carotid bifurcation. Tortuous but otherwise negative cervical right ICA. Left carotid system: Negative left CCA and left carotid bifurcation. Negative cervical left ICA. Vertebral arteries: The proximal right subclavian artery and cervical right vertebral artery appear normal. The right vertebral is dominant. Normal proximal left subclavian artery. The non dominant left vertebral artery is diminutive but remains patent to the cisterna magna. CTA HEAD Posterior circulation: The non dominant left vertebral artery seems to functionally terminates in PICA and/or an anterior spinal artery. The right vertebral artery supplies the basilar. No right vertebral or basilar artery stenosis identified. But both vessels are somewhat diminutive. Both posterior communicating arteries are present. Patent SCA origins. Overall fetal type PCAs. Bilateral PCA branches are grossly normal. Anterior circulation: Both ICA siphons are patent without plaque or stenosis. Normal posterior communicating artery origins. Patent carotid termini. Normal MCA and ACA origins. Bilateral ACA branches are within normal limits. Right MCA M1 and bifurcation are patent without stenosis. Right MCA branches are within normal limits. The left MCA M1 is patent and gives off an anterior temporal artery on series 9, image 118. Then at the expected location of the left MCA bifurcation or trifurcation the vessel is occluded (  series 11, image 20, series 5, image 57). There is reconstitution of the dominant posterior division branch. Venous sinuses: Early contrast timing, not evaluated. Anatomic variants: Dominant right vertebral artery, the left appears to terminates in PICA and/or the anterior spinal artery. Fetal type PCA origins. Review of the MIP images confirms the above findings IMPRESSION: 1. Positive for emergent large vessel occlusion at the Left MCA bifurcation. There is reconstitution of the dominant posterior division branch. This preliminary  result was communicated to Dr. Leonel Ramsay at 0413 hours, and then we discussed by telephone. 2. Elsewhere negative CTA head and neck. Dominant right vertebral artery which supplies the basilar. Fetal type PCA origins. Electronically Signed   By: Genevie Ann M.D.   On: 05/09/2019 04:22   CT HEAD WO CONTRAST  Result Date: 04/23/2019 CLINICAL DATA:  Followup stroke.  Left MCA occlusion. EXAM: CT HEAD WITHOUT CONTRAST TECHNIQUE: Contiguous axial images were obtained from the base of the skull through the vertex without intravenous contrast. COMPARISON:  CT studies done yesterday. FINDINGS: Brain: No abnormality is seen affecting the brainstem or cerebellum. I think there is some artifactual low-density in the posterior fossa. Cannot completely rule out cerebellar infarctions but think that would be unlikely given the previous studies. Right hemisphere remains normal. I think there is a small area gray-white differentiation loss in the left frontal operculum. No hemorrhage or swelling. Vascular: No abnormal vascular finding. Skull: Negative Sinuses/Orbits: Clear/normal Other: None IMPRESSION: Small region of loss of gray-white differentiation in the left frontal operculum consistent with a fairly small area of acute infarction compared to the previous perfusion abnormality. No hemorrhage or mass effect. Areas of low-density in the cerebellum that I favor are artifactual. There did not seem to be any abnormalities in that region on the previous examinations. Electronically Signed   By: Nelson Chimes M.D.   On: 04/23/2019 05:01   CT Code Stroke CTA Neck W/WO contrast  Result Date: 05/07/2019 CLINICAL DATA:  34 year old male code stroke presentation. Suspicious left MCA sylvian fissure branch hyperdensity. History of cardiac issues with decreased cardiac ejection fraction. EXAM: CT ANGIOGRAPHY HEAD AND NECK TECHNIQUE: Multidetector CT imaging of the head and neck was performed using the standard protocol during bolus  administration of intravenous contrast. Multiplanar CT image reconstructions and MIPs were obtained to evaluate the vascular anatomy. Carotid stenosis measurements (when applicable) are obtained utilizing NASCET criteria, using the distal internal carotid diameter as the denominator. CONTRAST:  10mL OMNIPAQUE IOHEXOL 350 MG/ML SOLN COMPARISON:  Plain head CT 0402 hours today. FINDINGS: CTA NECK Skeleton: Lower cervical spine endplate degeneration. Mild reversal of lordosis. Upper chest: Negative upper lungs and mediastinum. Other neck: Negative. Aortic arch: Suboptimal but adequate contrast bolus. Three vessel arch configuration with no arch atherosclerosis. Right carotid system: Negative right CCA and right carotid bifurcation. Tortuous but otherwise negative cervical right ICA. Left carotid system: Negative left CCA and left carotid bifurcation. Negative cervical left ICA. Vertebral arteries: The proximal right subclavian artery and cervical right vertebral artery appear normal. The right vertebral is dominant. Normal proximal left subclavian artery. The non dominant left vertebral artery is diminutive but remains patent to the cisterna magna. CTA HEAD Posterior circulation: The non dominant left vertebral artery seems to functionally terminates in PICA and/or an anterior spinal artery. The right vertebral artery supplies the basilar. No right vertebral or basilar artery stenosis identified. But both vessels are somewhat diminutive. Both posterior communicating arteries are present. Patent SCA origins. Overall fetal type PCAs. Bilateral PCA branches  are grossly normal. Anterior circulation: Both ICA siphons are patent without plaque or stenosis. Normal posterior communicating artery origins. Patent carotid termini. Normal MCA and ACA origins. Bilateral ACA branches are within normal limits. Right MCA M1 and bifurcation are patent without stenosis. Right MCA branches are within normal limits. The left MCA M1 is  patent and gives off an anterior temporal artery on series 9, image 118. Then at the expected location of the left MCA bifurcation or trifurcation the vessel is occluded (series 11, image 20, series 5, image 57). There is reconstitution of the dominant posterior division branch. Venous sinuses: Early contrast timing, not evaluated. Anatomic variants: Dominant right vertebral artery, the left appears to terminates in PICA and/or the anterior spinal artery. Fetal type PCA origins. Review of the MIP images confirms the above findings IMPRESSION: 1. Positive for emergent large vessel occlusion at the Left MCA bifurcation. There is reconstitution of the dominant posterior division branch. This preliminary result was communicated to Dr. Leonel Ramsay at 0413 hours, and then we discussed by telephone. 2. Elsewhere negative CTA head and neck. Dominant right vertebral artery which supplies the basilar. Fetal type PCA origins. Electronically Signed   By: Genevie Ann M.D.   On: 05/07/2019 04:22   CT CEREBRAL PERFUSION W CONTRAST  Result Date: 04/23/2019 CLINICAL DATA:  34 year old male code stroke presentation with left MCA bifurcation occlusion on CTA. EXAM: CT PERFUSION BRAIN TECHNIQUE: Multiphase CT imaging of the brain was performed following IV bolus contrast injection. Subsequent parametric perfusion maps were calculated using RAPID software. CONTRAST:  86mL OMNIPAQUE IOHEXOL 350 MG/ML SOLN COMPARISON:  CTA head and neck, plain CT earlier today. FINDINGS: CT Brain Perfusion Findings: CBF (<30%) Volume: 0 mL. There is a small 6-8 mL volume of less stringent and CBF <38% detected. Perfusion (Tmax>6.0s) volume: 60 mL. Hypoperfusion index 0.5 with 32 mL of T-max greater than 10s detected. Mismatch Volume: 60 mL ASPECTS on noncontrast CT Head: 10 at 0402 hours today. Infarction Location:Left frontal operculum, deep white matter. IMPRESSION: 1. CTP detects evidence of Left MCA territory oligemia concordant with the CTA findings.  2. No infarct core detected with standard CBF <30%. 3. 60 mL of penumbra detected with standard T-max > 6s. Electronically Signed   By: Genevie Ann M.D.   On: 04/28/2019 05:06   DG Chest Port 1 View  Result Date: 04/23/2019 CLINICAL DATA:  Fever EXAM: PORTABLE CHEST 1 VIEW COMPARISON:  04/27/2019 FINDINGS: An endotracheal tube with tip 4 cm above the carina, NG tube entering the stomach with tip off the field of view and LEFT IJ central venous catheter with tip overlying the mid SVC again noted. Cardiomegaly again identified. Increasing patchy bilateral airspace opacities noted. No definite pleural effusion or pneumothorax. IMPRESSION: Increasing patchy bilateral airspace opacities which may represent edema or infection. Electronically Signed   By: Margarette Canada M.D.   On: 04/23/2019 07:57   DG CHEST PORT 1 VIEW  Result Date: 04/21/2019 CLINICAL DATA:  Central line placement EXAM: PORTABLE CHEST 1 VIEW COMPARISON:  04/27/2019, 8:16 a.m. FINDINGS: Interval placement of like neck vascular catheter, tip positioned near the superior cavoatrial junction. Interval retraction of endotracheal tube, now positioned just above the thoracic inlet, approximately 6.5 cm above the carina. Interval placement of esophagogastric tube, tip and side port below the diaphragm. Unchanged cardiomegaly and mild, diffuse bilateral interstitial airspace opacity. IMPRESSION: 1. Interval placement of neck vascular catheter, tip positioned near the superior cavoatrial junction. 2. Interval retraction of endotracheal tube, now positioned  just above the thoracic inlet, approximately 6.5 cm above the carina. 3. Interval placement of esophagogastric tube, tip and side port below the diaphragm. 4. Stable cardiomegaly and diffuse bilateral interstitial airspace opacity, likely edema. Electronically Signed   By: Eddie Candle M.D.   On: 04/30/2019 19:58   DG Chest Port 1 View  Result Date: 04/25/2019 CLINICAL DATA:  Intubation. EXAM: PORTABLE CHEST 1  VIEW COMPARISON:  Chest x-ray 12/13/2018. FINDINGS: Endotracheal tube tip noted just above the carina. Proximal repositioning of 2-3 cm should be considered. Stable cardiomegaly. Diffuse bilateral pulmonary infiltrates/edema. Small left pleural effusion. No pneumothorax. IMPRESSION: 1. Endotracheal tube tip noted just above the carina. Proximal repositioning of 2-3 should be considered. 2. Cardiomegaly again noted. Diffuse bilateral pulmonary infiltrates/edema. Small left pleural effusion. These results will be called to the ordering clinician or representative by the Radiologist Assistant, and communication documented in the PACS or zVision Dashboard. Electronically Signed   By: Marcello Moores  Register   On: 04/21/2019 08:41   DG Abd Portable 1V  Result Date: 05/10/2019 CLINICAL DATA:  OG tube placement. EXAM: PORTABLE ABDOMEN - 1 VIEW COMPARISON:  None FINDINGS: OG tube tip is in the distal stomach. Bowel gas pattern is normal. Contrast seen in the renal collecting systems bilaterally. No bone abnormality. IMPRESSION: OG tube tip in the distal stomach. Electronically Signed   By: Lorriane Shire M.D.   On: 05/13/2019 10:07   IR PATIENT EVAL TECH 0-60 MINS  Result Date: 04/23/2019 Darnelle Spangle     04/23/2019 10:29 AM 33fr sheath pulled from right groin using manual pressure and quikclot hemostasis gauze at 0938 hrs.  Hemostasis achieved at 1003 hrs.  Groin reviewed with patient's RN Anderson Malta.  PT pulse on right side is dopplerable.  Unable to palpate or doppler DP pulse.  RN Anderson Malta aware of pedal pulse situation on right side.  Quikclot hemostasis gauze needs to be removed no later than 1000 hrs 04/24/2019.  ECHOCARDIOGRAM COMPLETE  Result Date: 04/28/2019   ECHOCARDIOGRAM REPORT   Patient Name:   Maureen Haagen. Date of Exam: 05/07/2019 Medical Rec #:  [id number]                 Height:       73.0 in Accession #:    [id number]                Weight:       273.8 lb Date of Birth:  1985/07/06                  BSA:          2.46 m Patient Age:    33 years                  BP:           121/95 mmHg Patient Gender: M                         HR:           87 bpm. Exam Location:  Inpatient Procedure: 2D Echo Indications:    stroke  History:        Patient has prior history of Echocardiogram examinations, most                 recent 07/13/2018. Chronic kidney disease; Risk Factors:Sleep                 Apnea and  substance abuse.  Sonographer:    Johny Chess Referring Phys: [phone] [place]  Sonographer Comments: Echo performed with patient supine and on artificial respirator. IMPRESSIONS  1. Left ventricular ejection fraction, by visual estimation, is <20%. The left ventricle has severely decreased function. There is no left ventricular hypertrophy.  2. Apex is not well-visualized. In setting of stroke with severe LV systolic dysfunction, recommend repeat TTE with contrast to rule out LV apical thrombus  3. Severely dilated left ventricular internal cavity size.  4. Left ventricular diastolic parameters are consistent with Grade III diastolic dysfunction (restrictive).  5. Elevated left atrial pressure.  6. The left ventricle demonstrates global hypokinesis.  7. Global right ventricle has normal systolic function.The right ventricular size is mildly enlarged.  8. Left atrial size was moderately dilated.  9. Right atrial size was normal. 10. The mitral valve is normal in structure. Mild mitral valve regurgitation. 11. The tricuspid valve is normal in structure. Tricuspid valve regurgitation is trivial. 12. The aortic valve is normal in structure. Aortic valve regurgitation is not visualized. No evidence of aortic valve sclerosis or stenosis. 13. The pulmonic valve was not well visualized. Pulmonic valve regurgitation is not visualized. FINDINGS  Left Ventricle: Left ventricular ejection fraction, by visual estimation, is <20%. The left ventricle has severely decreased function. The left ventricle demonstrates  global hypokinesis. The left ventricular internal cavity size was severely dilated left ventricle. There is no left ventricular hypertrophy. Left ventricular diastolic parameters are consistent with Grade III diastolic dysfunction (restrictive). Elevated left atrial pressure. Right Ventricle: The right ventricular size is mildly enlarged. No increase in right ventricular wall thickness. Global RV systolic function is has normal systolic function. Left Atrium: Left atrial size was moderately dilated. Right Atrium: Right atrial size was normal in size Pericardium: There is no evidence of pericardial effusion. Mitral Valve: The mitral valve is normal in structure. Mild mitral valve regurgitation. Tricuspid Valve: The tricuspid valve is normal in structure. Tricuspid valve regurgitation is trivial. Aortic Valve: The aortic valve is normal in structure. Aortic valve regurgitation is not visualized. The aortic valve is structurally normal, with no evidence of sclerosis or stenosis. Pulmonic Valve: The pulmonic valve was not well visualized. Pulmonic valve regurgitation is not visualized. Pulmonic regurgitation is not visualized. Aorta: The aortic root is normal in size and structure. Venous: IVC assessment for right atrial pressure unable to be performed due to mechanical ventilation. IAS/Shunts: No atrial level shunt detected by color flow Doppler.  LEFT VENTRICLE PLAX 2D LVIDd:         7.20 cm       Diastology LVIDs:         6.40 cm       LV e' lateral:   6.53 cm/s LV PW:         1.00 cm       LV E/e' lateral: 12.5 LV IVS:        0.80 cm       LV e' medial:    3.92 cm/s LVOT diam:     2.00 cm       LV E/e' medial:  20.8 LV SV:         64 ml LV SV Index:   24.74 LVOT Area:     3.14 cm  LV Volumes (MOD) LV area d, A4C:    56.30 cm LV area s, A4C:    49.80 cm LV major d, A4C:   10.20 cm LV major s, A4C:  10.00 cm LV vol d, MOD A4C: 255.0 ml LV vol s, MOD A4C: 208.0 ml LV SV MOD A4C:     255.0 ml RIGHT VENTRICLE RV S  prime:     11.20 cm/s TAPSE (M-mode): 1.8 cm LEFT ATRIUM              Index       RIGHT ATRIUM           Index LA diam:        4.30 cm  1.75 cm/m  RA Area:     22.40 cm LA Vol (A2C):   104.0 ml 42.29 ml/m RA Volume:   70.50 ml  28.67 ml/m LA Vol (A4C):   97.1 ml  39.49 ml/m LA Biplane Vol: 104.0 ml 42.29 ml/m  AORTIC VALVE LVOT Vmax:   47.00 cm/s LVOT Vmean:  34.500 cm/s LVOT VTI:    0.070 m  AORTA Ao Root diam: 3.00 cm MITRAL VALVE MV Area (PHT): 4.06 cm            SHUNTS MV PHT:        54.23 msec          Systemic VTI:  0.07 m MV Decel Time: 187 msec            Systemic Diam: 2.00 cm MR PISA:        0.57 cm MR PISA Radius: 0.30 cm MV E velocity: 81.40 cm/s 103 cm/s  Oswaldo Milian MD Electronically signed by Oswaldo Milian MD Signature Date/Time: 04/27/2019/4:40:48 PM    Final    CT HEAD CODE STROKE WO CONTRAST  Result Date: 04/28/2019 CLINICAL DATA:  Code stroke.  34 year old male EXAM: CT HEAD WITHOUT CONTRAST TECHNIQUE: Contiguous axial images were obtained from the base of the skull through the vertex without intravenous contrast. COMPARISON:  Head CT 08/28/2018. FINDINGS: Brain: No midline shift, mass effect, or evidence of intracranial mass lesion. No ventriculomegaly. No acute intracranial hemorrhage identified. Evidence of chronic cortical encephalomalacia in the right frontal operculum (series 3, image 23). No superimposed acute cortically based infarct identified. Vascular: Suspicious asymmetric hyperdensity of a left MCA branch in the sylvian fissure on series 3, image 16. Skull: Stable, negative. Sinuses/Orbits: Visualized paranasal sinuses and mastoids are stable and well pneumatized. Other: No acute orbit or scalp soft tissue findings. ASPECTS [facility] Stroke Program Early CT Score) Total score (0-10 with 10 being normal): 10 (left MCA territory, chronic cortical encephalomalacia right frontal operculum). IMPRESSION: 1. Asymmetric hyperdensity of a Left MCA branch in the Sylvian  fissure suspicious for emergent large vessel occlusion in the setting. 2. No associated acute cortically based infarct, ASPECTS 10. No acute hemorrhage. 3. Chronic right frontal operculum encephalomalacia. 4. These results were communicated to Dr. Leonel Ramsay at 4:06 am on 05/14/2019 by text page via the [facility] messaging system. Electronically Signed   By: Genevie Ann M.D.   On: 05/05/2019 04:10    Labs:  CBC: Recent Labs    12/16/18 0242 12/16/18 0242 05/10/2019 0355 05/14/2019 0430 05/09/2019 2338 04/23/19 0208 04/23/19 0945 04/23/19 1022  WBC 8.2  --  7.2  --   --  21.9* 16.2*  --   HGB 13.3   < > 14.9   < > 16.3 15.4 14.6 13.9  HCT 39.2   < > 42.8   < > 48.0 45.2 44.0 41.0  PLT 268  --  243  --   --  171 145*  --    < > = values  in this interval not displayed.    COAGS: Recent Labs    08/27/18 2342 04/23/2019 0355  INR 0.9 1.0  APTT  --  24    BMP: Recent Labs    12/17/18 0130 12/17/18 0130 12/18/18 0427 12/18/18 0427 04/30/2019 0355 04/21/2019 0355 04/30/2019 0430 05/12/2019 0905 04/23/2019 2108 05/02/2019 2123 05/15/2019 2338 04/23/19 0002 04/23/19 0608 04/23/19 1022  NA 136  137   < > 136   < > 136   < > 136   < > 134*  --  139 139  --  140  K 3.9  3.8   < > 3.5   < > 3.4*   < > 3.4*   < > 7.3*   < > 5.1 5.0 5.0 4.7  CL 103  103   < > 100  --  98  --  102  --   --   --   --  109  --   --   CO2 25  25  --  25  --  24  --   --   --   --   --   --  15*  --   --   GLUCOSE 224*  233*   < > 276*  --  153*  --  149*  --   --   --   --  220*  --   --   BUN 24*  24*   < > 27*  --  17  --  17  --   --   --   --  23*  --   --   CALCIUM 9.0  9.0  --  8.8*  --  9.5  --   --   --   --   --   --  7.7*  --   --   CREATININE 1.06  1.05   < > 1.09  --  1.17  --  1.10  --   --   --   --  2.64*  --   --   GFRNONAA >60  >60  --  >60  --  >60  --   --   --   --   --   --  30*  --   --   GFRAA >60  >60  --  >60  --  >60  --   --   --   --   --   --  35*  --   --    < > = values in this  interval not displayed.    LIVER FUNCTION TESTS: Recent Labs    12/17/18 0130 12/18/18 0427 04/20/2019 0355 04/23/19 0002  BILITOT 0.6 0.4 0.5 1.6*  AST 15 23 21  >10,000*  ALT 32 32 23 11,085*  ALKPHOS 83 82 95 88  PROT 6.6 6.8 6.6 6.1*  ALBUMIN 3.5 3.6 3.4* 2.9*    Assessment and Plan:  Acute CVA s/p cerebral arteriogram with emergent mechanical thrombectomy of left MCA superior division mid M2 segment achieving a TICI 3 revascularization 04/28/2019 by Dr. Estanislado Pandy. Patient's condition stable- remains intubated/sedated, no movements of extremities. Right groin incision stable. Further plans per neurology/CCM/cardiology- appreciate and agree with management. NIR to follow.   Electronically Signed: Earley Abide, PA-C 04/23/2019, 10:50 AM   I spent a total of 25 Minutes at the the patient's bedside AND on the patient's hospital floor or unit, greater than 50% of which was counseling/coordinating care for left MCA  superior division mid M2 segment occlusion s/p revascularization.

## 2019-04-23 NOTE — Progress Notes (Addendum)
STROKE TEAM PROGRESS NOTE   HISTORY OF PRESENT ILLNESS (per record) Pleas Carneal. is a 34 y.o. male who was last in his normal state of health "sometime between 1 and 2 AM." At that time, he spoke with his daughter and he was speaking normally.  Around 3 AM, he began having difficulty with his speech and right-sided weakness.  He was try to use his phone and was unable to.  He also states that he might have had some trouble with his left hand as well that he noticed, but was more noticing his right hand.  He was brought in as a code stroke, and en route he completely resolved.  On arrival he had an NIHSS of 0 with preserved naming, repetition, fluency.  A CT head and CTA were obtained which demonstrated M2 occlusion. Despite this, with NIH of 0 and an asymptomatic patient, TPA and thrombectomy were not considered.  However, after returning the room, he began having reworsening of his aphasia.  His NIH increased to 2, however this appeared to be a disabling aphasia(though mildly) and therefore he was given IV TPA. A this point I asked who should make decisions if he was unable to and he indicated Elsie Amis, his fiance.  Given these sympotms, and possible LVO, he was taken for CTP. By the end of his CTP, his aphasia had become quite severe with mild right drift and droop. His NIHSS was 6 at that time and I discussed thrombectomy with his  LKW: 1 AM tpa given?:  Yes IR Thrombectomy? Yes Modified Rankin Scale: 1-No significant post stroke disability and can perform usual duties with stroke symptoms NIHSS: 6   INTERVAL HISTORY His fiancee is at the bedside.  Patient's condition has declined since yesterday developed pain refractory hypotension and hypoxemia and respiratory failure requiring increased FiO2 as well as maximum dose of pressors.  He had temperature spike of 103 and lab work from this morning showed significantly elevated liver enzymes with normal bilirubin suggestive of shock  liver.  His neurological exam is limited by his sedation but follow-up CT scan from this morning shows only small left frontal upper collateral infarct without any hemorrhage.  2D echo shows diminished ejection fraction less than 20% with apex not well visualized.   OBJECTIVE Vitals:   04/23/19 0130 04/23/19 0200 04/23/19 0230 04/23/19 0400  BP: 139/75 124/89 129/83   Pulse: 98 99 99   Resp: (!) 33 (!) 32 (!) 34   Temp:    (!) 103.2 F (39.6 C)  TempSrc:    Oral  SpO2: 96% 96% 100%   Weight:      Height:        CBC:  Recent Labs  Lab 05/06/2019 0355 05/05/2019 0430 04/27/2019 2338 04/23/19 0208  WBC 7.2  --   --  21.9*  NEUTROABS 4.3  --   --  19.2*  HGB 14.9   < > 16.3 15.4  HCT 42.8   < > 48.0 45.2  MCV 89.0  --   --  90.8  PLT 243  --   --  171   < > = values in this interval not displayed.    Basic Metabolic Panel:  Recent Labs  Lab 04/25/2019 0355 04/20/2019 0355 05/02/2019 0430 05/10/2019 0905 05/01/2019 2123 04/27/2019 2208 04/28/2019 2338 04/23/19 0002 04/23/19 0208  NA 136   < > 136   < >  --    < > 139 139  --  K 3.4*   < > 3.4*   < > 7.2*   < > 5.1 5.0  --   CL 98   < > 102  --   --   --   --  109  --   CO2 24  --   --   --   --   --   --  15*  --   GLUCOSE 153*   < > 149*  --   --   --   --  220*  --   BUN 17   < > 17  --   --   --   --  23*  --   CREATININE 1.17   < > 1.10  --   --   --   --  2.64*  --   CALCIUM 9.5  --   --   --   --   --   --  7.7*  --   MG  --   --   --   --  2.1  --   --   --  1.9   < > = values in this interval not displayed.    Lipid Panel:     Component Value Date/Time   CHOL 265 (H) 04/23/2019 0327   TRIG 191 (H) 04/23/2019 0327   HDL 24 (L) 04/23/2019 0327   CHOLHDL 11.0 04/23/2019 0327   VLDL 38 04/23/2019 0327   LDLCALC 203 (H) 04/23/2019 0327   HgbA1c:  Lab Results  Component Value Date   HGBA1C 6.5 (H) 04/23/2019   Urine Drug Screen:     Component Value Date/Time   LABOPIA NONE DETECTED 12/14/2018 1234   COCAINSCRNUR  POSITIVE (A) 12/14/2018 1234   COCAINSCRNUR NONE DETECTED 02/04/2018 1010   LABBENZ NONE DETECTED 12/14/2018 1234   AMPHETMU NONE DETECTED 12/14/2018 1234   THCU NONE DETECTED 12/14/2018 1234   LABBARB NONE DETECTED 12/14/2018 1234    Alcohol Level     Component Value Date/Time   ETH <10 05/09/2019 0355    IMAGING  CT Code Stroke CTA Head W/WO contrast CT Code Stroke CTA Neck W/WO contrast 05/06/2019 IMPRESSION:  1. Positive for emergent large vessel occlusion at the Left MCA bifurcation. There is reconstitution of the dominant posterior division branch.  2. Elsewhere negative CTA head and neck. Dominant right vertebral artery which supplies the basilar. Fetal type PCA origins.   MRI Head WO Contrast - pending 04/23/2019  CT HEAD WO CONTRAST 04/23/2019 IMPRESSION:  Small region of loss of gray-white differentiation in the left frontal operculum consistent with a fairly small area of acute infarction compared to the previous perfusion abnormality. No hemorrhage or mass effect. Areas of low-density in the cerebellum that I favor are artifactual. There did not seem to be any abnormalities in that region on the previous examinations.   CT CEREBRAL PERFUSION W CONTRAST 04/23/2019 IMPRESSION:  1. CTP detects evidence of Left MCA territory oligemia concordant with the CTA findings.  2. No infarct core detected with standard CBF <30%.  3. 60 mL of penumbra detected with standard T-max > 6s.   CT HEAD CODE STROKE WO CONTRAST 05/02/2019 IMPRESSION:  1. Asymmetric hyperdensity of a Left MCA branch in the Sylvian fissure suspicious for emergent large vessel occlusion in the setting.  2. No associated acute cortically based infarct, ASPECTS 10. No acute hemorrhage.  3. Chronic right frontal operculum encephalomalacia.    DG CHEST PORT 1 VIEW 05/07/2019 IMPRESSION:  1. Interval  placement of neck vascular catheter, tip positioned near the superior cavoatrial junction.  2. Interval retraction of  endotracheal tube, now positioned just above the thoracic inlet, approximately 6.5 cm above the carina.  3. Interval placement of esophagogastric tube, tip and side port below the diaphragm.  4. Stable cardiomegaly and diffuse bilateral interstitial airspace opacity, likely edema.   DG Chest Port 1 View 05/04/2019 IMPRESSION:  1. Endotracheal tube tip noted just above the carina. Proximal repositioning of 2-3 should be considered.  2. Cardiomegaly again noted. Diffuse bilateral pulmonary infiltrates/edema. Small left pleural effusion.   DG Abd Portable 1V 05/03/2019 IMPRESSION:  OG tube tip in the distal stomach.   ECHOCARDIOGRAM COMPLETE 05/06/2019 IMPRESSIONS   1. Left ventricular ejection fraction, by visual estimation, is <20%. The left ventricle has severely decreased function. There is no left ventricular hypertrophy.   2. Apex is not well-visualized. In setting of stroke with severe LV systolic dysfunction, recommend repeat TTE with contrast to rule out LV apical thrombus   3. Severely dilated left ventricular internal cavity size.   4. Left ventricular diastolic parameters are consistent with Grade III diastolic dysfunction (restrictive).   5. Elevated left atrial pressure.   6. The left ventricle demonstrates global hypokinesis.   7. Global right ventricle has normal systolic function.The right ventricular size is mildly enlarged.   8. Left atrial size was moderately dilated.   9. Right atrial size was normal.  10. The mitral valve is normal in structure. Mild mitral valve regurgitation.  11. The tricuspid valve is normal in structure. Tricuspid valve regurgitation is trivial.  12. The aortic valve is normal in structure. Aortic valve regurgitation is not visualized. No evidence of aortic valve sclerosis or stenosis.  13. The pulmonic valve was not well visualized. Pulmonic valve regurgitation is not visualized.   Interventional Neuro Radiology - Cerebral Angiogram with  Intervention 05/11/2019 S/P Lt common carotid arteriogram followed by complete revascularization of occluded LT MCA sup division mid M2 seg with x 1 pass with 73mx 40 mm solitaire X ret river device and penumbra aspiration with TICI 3 revascularization  ECG - SR rate 93 BPM. (See cardiology reading for complete details)  PHYSICAL EXAM Blood pressure 129/83, pulse 99, temperature (!) 103.2 F (39.6 C), temperature source Oral, resp. rate (!) 34, height 6' 1"  (1.854 m), weight 124.2 kg, SpO2 100 %. Morbidly obese young African-American male who is sedated intubated and in respiratory distress. . Afebrile. Head is nontraumatic. Neck is supple without bruit.    Cardiac exam no murmur or gallop. Lungs are clear to auscultation. Distal pulses are well felt.  Neurological Exam :   Patient is unresponsive with eyes closed.  He does not follow any commands.  Eyes are in primary position.  Pupils 4 mm reactive.  Corneal reflexes are present.  Doll's eye movements are sluggish.  Fundi not visualized.  There is minimum response to sternal rub.  Trace withdrawal in the lower extremities to painful stimuli none in the upper extremities.   ASSESSMENT/PLAN Mr. LLong Brimage is a 34y.o. male with history of NICM EF 15-20%, CHF, DM, Obesity, tobacco use, OSA, and hx of cocaine use presenting with aphasia and right sided weakness.  The patient received IV t-PA on Friday 04/21/2019 at 4:45 AM. Mechanical thrombectomy left M2. Patient seems to have developed sepsis with cardiogenic versus septic shock and shock liver and refractory hypoxemia Stroke:  L MCA infarct - embolic - likely secondary to low EF  Resultant aphasia  Code Stroke CT Head -  Asymmetric hyperdensity of a Left MCA branch in the Sylvian fissure suspicious for emergent large vessel occlusion in the setting. No associated acute cortically based infarct, ASPECTS 10.   CT head - Small region of loss of gray-white differentiation in the left  frontal operculum consistent with a fairly small area of acute infarction compared to the previous perfusion abnormality. Areas of low-density in the cerebellum that I favor are artifactual.  MRI head - pending  MRA head - not ordered  CTA H&N - Positive for emergent large vessel occlusion at the Left MCA bifurcation. Elsewhere negative CTA head and neck.  CT Perfusion - CTP detects evidence of Left MCA territory oligemia concordant with the CTA findings. No infarct core detected with standard CBF <30%. 60 mL of penumbra detected with standard T-max > 6s.   Carotid Doppler - CTA neck performed - carotid dopplers not indicated.  2D Echo - EF < 20% TEE recommended  [employer] Virus 2 - negative  LDL - 203  HgbA1c - 6.5  UDS - pending  VTE prophylaxis - SCDs Diet  Diet Order            Diet NPO time specified  Diet effective now              aspirin 81 mg daily prior to admission, now on No antithrombotic s/p tPA  Patient counseled to be compliant with his antithrombotic medications  Ongoing aggressive stroke risk factor management  Therapy recommendations:  pending  Disposition:  Pending  Hypertension  Home BP meds: Coreg, Entresto  Current BP meds: Labetalol and Cardene prn - Levophed  Stable . Permissive hypertension (OK if < 220/120) but gradually normalize in 5-7 days . Long-term BP goal normotensive  Hyperlipidemia  Home Lipid lowering medication: Lipitor 40 mg daily  LDL 203, goal < 70  Current lipid lowering medication: Lipitor 80 mg daily   Continue statin at discharge  Diabetes  Home diabetic meds: insulin  Current diabetic meds: insulin  HgbA1c 6.5, goal < 7.0 Recent Labs    05/03/2019 1931 05/10/2019 2324 04/23/19 0337  GLUCAP 188* 218* 197*    Other Stroke Risk Factors  Cigarette smoker - advised to stop smoking  ETOH use, advised to drink no more than 1 alcoholic beverage per day.  Obesity, Body mass index is 36.13 kg/m.,  recommend weight loss, diet and exercise as appropriate   Obstructive sleep apnea - uses Cpap  Congestive Heart Failure  Substance Abuse Hx.  EF 15 - 20 %  Other Active Problems  Code status - Full code  Fever - 103.2 (WBCs 21.9) currently not on abxs. U/A pending. Will order CXR and Blood cultures  AKI - creatinine - 1.10->2.64  Hypotension - Levophed  Hyperkalemia - 7.2->5.0  Intubated / NPO  shock liver elevated transamines  Sepsis  Heart failure-acute on chronic systolic heart failure   Hospital day # 1 I have personally obtained history,examined this patient, reviewed notes, independently viewed imaging studies, participated in medical decision making and plan of care.ROS completed by me personally and pertinent positives fully documented  I have made any additions or clarifications directly to the above note.  Patient presented with aphasia due to left M2 occlusion and underwent successful thrombectomy and follow-up CT scan only shows a small left MCA branch infarct but unfortunately has medically declined and developed likely septic versus cardiogenic shock as well as shock liver remains in respiratory failure. Long  discussion with Dr. Marchelle Gearing critical care medicine as well as patient's fiancee at the bedside.  Continue cardiorespiratory support as well as treatment for infection and GI consult for hepatic failure and nephrology for renal failure.  Patient will remain intubated till medical parameters improve over the next few days.  Continue aspirin for stroke prevention for now unless it has to be held due to his bleeding and liver dysfunction. This patient is critically ill and at significant risk of neurological worsening, death and care requires constant monitoring of vital signs, hemodynamics,respiratory and cardiac monitoring, extensive review of multiple databases, frequent neurological assessment, discussion with family, other specialists and medical decision  making of high complexity.I have made any additions or clarifications directly to the above note.This critical care time does not reflect procedure time, or teaching time or supervisory time of PA/NP/Med Resident etc but could involve care discussion time.  I spent 50 minutes of neurocritical care time  in the care of  this patient.      Antony Contras, MD Medical Director [facility] Pager: [phone] 04/23/2019 11:25 AM   To contact Stroke Continuity provider, please refer to [email or web address] After hours, contact General Neurology

## 2019-04-23 NOTE — Progress Notes (Signed)
Pharmacy Antibiotic Note  Carlos Brewer. is a 34 y.o. male admitted on 05/04/2019 with sepsis. Admitted yesterday with acute stroke  In aki - SCr 1.1 >> 2.64  Plan: Zosyn iv 3.375 gm q8h Vanc 1250 mg q24h - est auc 487 Monitor renal fx cx vanc lvls prn  Height: 6\' 1"  (185.4 cm) Weight: 273 lb 13 oz (124.2 kg) IBW/kg (Calculated) : 79.9  Temp (24hrs), Avg:100.9 F (38.3 C), Min:96.6 F (35.9 C), Max:103.2 F (39.6 C)  Recent Labs  Lab 04/29/2019 0355 04/24/2019 0430 04/23/19 0002 04/23/19 0208  WBC 7.2  --   --  21.9*  CREATININE 1.17 1.10 2.64*  --     Estimated Creatinine Clearance: 54.4 mL/min (A) (by C-G formula based on SCr of 2.64 mg/dL (H)).    No Known Allergies  Barth Kirks, PharmD, BCPS, BCCCP Clinical Pharmacist [phone]  Please check AMION for all [facility] numbers  04/23/2019 9:08 AM

## 2019-04-23 NOTE — Progress Notes (Signed)
RT and RN transported pt from 4N27 to CT and back without complication. Pts respiratory status stable throughout transport. RT will continue to monitor.

## 2019-04-23 NOTE — Progress Notes (Signed)
RT talked to NP Gleason, and was instructed to increase pts PEEP from 5 to 10 based on ABG PaO2. RT will continue to monitor.

## 2019-04-23 NOTE — Progress Notes (Signed)
OT Cancellation Note  Patient Details Name: Carlos Brewer. MRN: [id number] DOB: 1985/05/16   Cancelled Treatment:    Reason Eval/Treat Not Completed: Medical issues which prohibited therapy;Patient not medically ready RN requesting hold on therapy this date due to medical status. Will continue to follow as available and appropriate to initiate OT POC.  Zenovia Jarred, MSOT, OTR/L Acute Rehabilitation Services [facility] Office Number: [phone]  Zenovia Jarred 04/23/2019, 11:23 AM

## 2019-04-23 NOTE — Progress Notes (Signed)
   Venous O2 sat 79% - findings more consistent with primary septic shock. Agree with nephrology recommendations about higher dose lasix to augment diuresis. Continue current pressors. Troponin trending down - CK minimally elevated.  Pixie Casino, MD, [facility], [place] Director of the Advanced Lipid Disorders &  Cardiovascular Risk Reduction Clinic Diplomate of the American Board of Clinical Lipidology Attending Cardiologist  Direct Dial: [phone]  Fax: [phone]  Website:  [email or web address]

## 2019-04-23 NOTE — Progress Notes (Signed)
NAME:  Carlos Brewer., MRN:  [id number], DOB:  [birth year], LOS: 1 ADMISSION DATE:  05/15/2019, CONSULTATION DATE:  05/07/2019 REFERRING MD:  Dr. Leonel Ramsay, CHIEF COMPLAINT:  Slurred speech  Brief History   33 yo male smoker found to have slurred speech and Rt sided weakness.  CT head showed M2 occlusion.  Ultimately treated with thrombolytic and thrombectomy by IR.  Required intubation for airway protection.  Early AM 2/5  Progressive aphasia, R Drift and droop   Hx of CHF (EF 15%)  Hx myocarditis Smoker 1/2 ppd  OSA    Past Medical History  Systolic CHF with non ischemic CM, Cocaine abuse, OSA, DM  Significant Hospital Events   2/05 Admit, tPA, IR thrombectomy  Consults:  Neuro IR  Procedures:  ETT 2/05 >>   Significant Diagnostic Tests:  CT angio head/neck 2/05 >> occlusion of Lt MCA bifurcation Echo 2/05 >>   Micro Data:  SARS CoV2 PCR 2/05 >> negative Influenza PCR 2/05 >> negative  Antimicrobials:    Interim history/subjective:  Hospital course summary:   2/5 TPA given at 428 am (total of 90 Mg) 520 am went to IR  S/P Lt common carotid arteriogram followed by complete revascularization of occluded LT MCA sup division mid M2 seg with x 1 pass with 23mx 40 mm solitaire X ret river device and penumbra aspiration with TICI 3 revascularization  Post procedure CT brain neg for ICH or mass effect. Patient left intubated with Rt groin 72F sheath to arterial flush. Distal pulses DPs and PTs dopplerable bilaterally.  4pm trying to switch to ps.  6pm getting  Central line for hypotension.  Phenylephrine had been titrated up over course of day.  Precedex had been started in AM.   10pm getting hyperkalemia cocktail  Increasing O2 needs and hypotension at 2000,  Fever 2300 100% on fvent at 2300 Overnight requiring increasing levophed and phenylephrine.    Tylenol x 4  Versed pushes x 5    Objective   Blood pressure 126/77, pulse (!) 104, temperature  (!) 103.2 F (39.6 C), temperature source Axillary, resp. rate (!) 32, height 6' 1"  (1.854 m), weight 124.2 kg, SpO2 100 %.    Vent Mode: PRVC FiO2 (%):  [40 %-100 %] 100 % Set Rate:  [18 bmp] 18 bmp Vt Set:  [630 mL] 630 mL PEEP:  [5 cmH20-10 cmH20] 10 cmH20 Plateau Pressure:  [25 cmH20-32 cmH20] 32 cmH20   Intake/Output Summary (Last 24 hours) at 04/23/2019 0830 Last data filed at 04/23/2019 0700 Gross per 24 hour  Intake 5743.02 ml  Output 875 ml  Net 4868.02 ml   Filed Weights   05/04/2019 0737  Weight: 124.2 kg    Examination:  General - non responsive, labored breathing on vent,  Eyes - pupils reactive ENT - ETT in place Cardiac - sinus tach, no murmur Chest - no wheeze Abdomen - soft, non tender, + bowel sounds Extremities - no cyanosis, clubbing, or edema Skin - no rashes Neuro - sedated   Resolved Hospital Problem list     Assessment & Plan:  L MCA CVA: s/p tpa 430 AM on 2/5 S/p IR clot removal.  Post procedure had been awake on vent moving all ext.  CT this am showed resolution.  Holding off on ASA for now due to bleeding risk from possible DIC, will start when safe.  BP goals now loosened since >24 hrs after stroke/tpa  Shock, septic:  Worsening septic shock with organ failure (  transaminitis and AKI).   Unclear source. Blood, sputum and Ucx pending. Foley placed this Am with thick, yellow blood tinged urine.  UA pending.  Starting zosyn vanc.  Stress dose steroids.  Metabolic acidosis, anion gap, lactic acidosis.   Cont Vent, intermittent bicarb.   May need RRT if inadequate UOP today. Fever -cooling measures, avoid acetaminophen if possible.   Some bleeding (mild from mouth and foley)- hold off on asa.  Check DIC labs.  Hold dig  Acute hypoxemic respiratory failure: B infiltrates, ARDS vs cardiogenic pulm edema.  Over 5L positive since admission.  Phenylephrine stopped, pure vasoconstriction not likely helping in the setting of his low EF at baseline.    Attempt aggressive diuresis today.  If not successful, will likely need RRT.  LTV ventilation as tolerates.  Currently acidemic on 12m/kg, will continue this for now.  RR in low 30s.    Transaminitis 2/2 hypoperfusion (shock liver) GI consult.  Doubt tylenol doses contributing significantly to liver failure, but will hold off on further doses for now.  Check Pt/INR.   AKI;  NO uop this am Diuresis, RRT if not effective  Hyperkalemia, monitor.  Renal consulting  Hypocalcemia: monitor, replete if needed.   Hx of cocaine abuse. depression. - not active per UDS  - continue prozac  Non ischemic CM with chronic systolic CHF. Hx of HTN, HLD. - goal SBP 120 to 140 for now per neuro IR - f/u Echo - followed by CWixomin BCaspar- continue lipitor, digoxin - resume ASA when okay with neurology - hold outpt coreg, zaroxolyn, entresto, aldactone, torsemide for now Cardiology consulting  DM type II poorly controlled. - SSI       Best practice:  Diet: NPO DVT prophylaxis: SCDs GI prophylaxis: protonix Mobility: bed rest Code Status: full code Disposition: ICU Discussed with mother and fiance at  Bedside.  Critically ill with poor prognosis.Plan for aggressive care, esp given his young age.  He has many supportive family and also has young children.   Labs   CBC: Recent Labs  Lab 05/12/2019 0355 05/14/2019 0355 04/23/2019 0430 05/13/2019 0905 05/01/2019 2108 04/28/2019 2338 04/23/19 0208  WBC 7.2  --   --   --   --   --  21.9*  NEUTROABS 4.3  --   --   --   --   --  19.2*  HGB 14.9   < > 13.9 14.6 15.0 16.3 15.4  HCT 42.8   < > 41.0 43.0 44.0 48.0 45.2  MCV 89.0  --   --   --   --   --  90.8  PLT 243  --   --   --   --   --  171   < > = values in this interval not displayed.    Basic Metabolic Panel: Recent Labs  Lab 05/02/2019 0355 04/24/2019 0355 04/25/2019 0430 05/09/2019 0430 05/04/2019 0905 04/21/2019 0905 05/02/2019 2108 05/08/2019 2108 05/15/2019 2123  04/21/2019 2208 04/30/2019 2338 04/23/19 0002 04/23/19 0208 04/23/19 0608  NA 136   < > 136  --  136  --  134*  --   --   --  139 139  --   --   K 3.4*   < > 3.4*   < > 4.2   < > 7.3*   < > 7.2* 5.6* 5.1 5.0  --  5.0  CL 98  --  102  --   --   --   --   --   --   --   --  109  --   --   CO2 24  --   --   --   --   --   --   --   --   --   --  15*  --   --   GLUCOSE 153*  --  149*  --   --   --   --   --   --   --   --  220*  --   --   BUN 17  --  17  --   --   --   --   --   --   --   --  23*  --   --   CREATININE 1.17  --  1.10  --   --   --   --   --   --   --   --  2.64*  --   --   CALCIUM 9.5  --   --   --   --   --   --   --   --   --   --  7.7*  --   --   MG  --   --   --   --   --   --   --   --  2.1  --   --   --  1.9  --    < > = values in this interval not displayed.   GFR: Estimated Creatinine Clearance: 54.4 mL/min (A) (by C-G formula based on SCr of 2.64 mg/dL (H)). Recent Labs  Lab 04/30/2019 0355 04/23/19 0208  WBC 7.2 21.9*    Liver Function Tests: Recent Labs  Lab 05/06/2019 0355 04/23/19 0002  AST 21 >10,000*  ALT 23 11,085*  ALKPHOS 95 88  BILITOT 0.5 1.6*  PROT 6.6 6.1*  ALBUMIN 3.4* 2.9*   No results for input(s): LIPASE, AMYLASE in the last 168 hours. No results for input(s): AMMONIA in the last 168 hours.  ABG    Component Value Date/Time   PHART 7.283 (L) 04/30/2019 2338   PCO2ART 38.6 04/21/2019 2338   PO2ART 46.0 (L) 04/23/2019 2338   HCO3 17.8 (L) 05/13/2019 2338   TCO2 19 (L) 05/06/2019 2338   ACIDBASEDEF 7.0 (H) 04/30/2019 2338   O2SAT 70.0 05/04/2019 2338     Coagulation Profile: Recent Labs  Lab 04/19/2019 0355  INR 1.0    Cardiac Enzymes: No results for input(s): CKTOTAL, CKMB, CKMBINDEX, TROPONINI in the last 168 hours.  HbA1C: Hgb A1c MFr Bld  Date/Time Value Ref Range Status  04/23/2019 03:27 AM 6.5 (H) 4.8 - 5.6 % Final    Comment:    (NOTE) Pre diabetes:          5.7%-6.4% Diabetes:              >6.4% Glycemic control  for   <7.0% adults with diabetes   04/21/2019 03:55 AM 6.5 (H) 4.8 - 5.6 % Final    Comment:    (NOTE) Pre diabetes:          5.7%-6.4% Diabetes:              >6.4% Glycemic control for   <7.0% adults with diabetes     CBG: Recent Labs  Lab 04/21/2019 1607 05/11/2019 1931 04/29/2019 2324 04/23/19 0337 04/23/19 0813  GLUCAP 141* 188* 218* 197* 179*    Review of Systems:   Unable to obtain  Past Medical History  He,  has a past medical history of CHF (congestive heart failure) ([place]), Cocaine abuse ([place]), HFrEF (heart failure with reduced ejection fraction) ([place]), History of cardiac cath, Morbid obesity ([employer]), Myocarditis ([place]), NICM (nonischemic cardiomyopathy) ([place]), Palpitations, Sleep apnea, and Tobacco abuse.   Surgical History    Past Surgical History:  Procedure Laterality Date  . CORONARY ANGIOPLASTY    . NO PAST SURGERIES    . RIGHT/LEFT HEART CATH AND CORONARY ANGIOGRAPHY N/A 10/27/2016   Procedure: RIGHT/LEFT HEART CATH AND CORONARY ANGIOGRAPHY;  Surgeon: Wellington Hampshire, MD;  Location: [facility];  Service: Cardiovascular;  Laterality: N/A;  . WISDOM TOOTH EXTRACTION  2008     Social History   reports that he has been smoking cigarettes. He has been smoking about 0.50 packs per day. He has never used smokeless tobacco. He reports current alcohol use. He reports previous drug use. Drug: Cocaine.   Family History   His family history includes Healthy in his mother; Heart failure in his father.   Allergies No Known Allergies   Home Medications  Prior to Admission medications   Medication Sig Start Date End Date Taking? Authorizing Provider  acetaminophen (TYLENOL) 500 MG tablet Take 2 tablets (1,000 mg total) by mouth every 6 (six) hours as needed. Patient not taking: Reported on 08/23/2018 10/12/16   Gerarda Fraction A, NP  albuterol (ACCUNEB) 1.25 MG/3ML nebulizer solution Take 3 mLs by nebulization 4 (four) times daily as needed for wheezing or  shortness of breath. 08/23/18   [provider]  albuterol (VENTOLIN HFA) 108 (90 Base) MCG/ACT inhaler Inhale 1-2 puffs into the lungs every 6 (six) hours as needed for wheezing or shortness of breath. 07/17/18   Florencia Reasons, MD  aspirin EC 81 MG EC tablet Take 1 tablet (81 mg total) by mouth daily. 10/30/16   Demetrios Loll, MD  atorvastatin (LIPITOR) 40 MG tablet Take 1 tablet (40 mg total) by mouth daily at 6 PM. 08/23/18   Bensimhon, Shaune Pascal, MD  bacitracin ointment Apply 1 application topically 2 (two) times daily. 08/28/18   Montine Circle, PA-C  carvedilol (COREG) 3.125 MG tablet Take 1 tablet (3.125 mg total) by mouth 2 (two) times daily with a meal. 08/23/18   Bensimhon, Shaune Pascal, MD  digoxin (LANOXIN) 0.125 MG tablet Take 1 tablet (0.125 mg total) by mouth daily. 08/23/18   Bensimhon, Shaune Pascal, MD  FLUoxetine (PROZAC) 20 MG capsule Take 1 capsule (20 mg total) by mouth daily. 12/19/18   Caren Griffins, MD  ibuprofen (ADVIL) 200 MG tablet Take 600 mg by mouth every 6 (six) hours as needed for moderate pain.    [provider]  insulin aspart (NOVOLOG) 100 UNIT/ML injection Inject 10 Units into the skin 3 (three) times daily before meals.    [provider]  insulin detemir (LEVEMIR) 100 UNIT/ML injection Inject 40 Units into the skin daily.    [provider]  metolazone (ZAROXOLYN) 2.5 MG tablet Take 1 tablet (2.5 mg total) by mouth daily as needed for up to 30 days. Take metolazone 2.76m x1 only for 5pound weight gain, please take potassium 235m x1 with metolazone. Patient not taking: Reported on 07/23/2018 07/17/18 08/16/18  XuFlorencia ReasonsMD  Multiple Vitamin (MULTI-VITAMIN) tablet Take 1 tablet by mouth daily.    [provider]  pantoprazole (PROTONIX) 40 MG tablet Take 40 mg by mouth daily.    [provider]  potassium chloride SA (K-DUR) 20 MEQ tablet Take 1 tablet (20  mEq total) by mouth daily. Can take extra one if taking metolazone. 08/23/18    Bensimhon, Shaune Pascal, MD  sacubitril-valsartan (ENTRESTO) 24-26 MG Take 1 tablet by mouth 2 (two) times daily. 08/23/18   Bensimhon, Shaune Pascal, MD  spironolactone (ALDACTONE) 25 MG tablet Take 1 tablet (25 mg total) by mouth daily. 08/23/18   Bensimhon, Shaune Pascal, MD  torsemide (DEMADEX) 20 MG tablet Take 2 tablets (40 mg total) by mouth 2 (two) times daily. Patient taking differently: Take 40 mg by mouth daily.  08/23/18   Bensimhon, Shaune Pascal, MD     Critical care time: 10 minutes       Collier Bullock MD

## 2019-04-23 NOTE — Progress Notes (Signed)
PT Cancellation Note  Patient Details Name: Carlos Brewer. MRN: [id number] DOB: [birth year]   Cancelled Treatment:    Reason Eval/Treat Not Completed: Medical issues which prohibited therapy, per nsg will hold   Jaymon Dudek J Amarah Brossman 04/23/2019, 8:40 AM

## 2019-04-23 NOTE — Progress Notes (Signed)
eLink Physician-Brief Progress Note Patient Name: Carlos Brewer. DOB: March 17, 1986 MRN: [id number]   Date of Service  04/23/2019  HPI/Events of Note  Conducted general review of this patient's clinical status given critically ill state.  Briefly, their major current problem is shock (believed to be primarily septic in etiology rather than cardiac) and acidosis (due to combination of lactic acidosis and renal failure).  He started CRRT in the last few hours and is tolerating this well so far. RN reports she is net negative 150cc for the past hour. Maintaining MAP >\= 65 mmHg (currently MAP 67) on epi 16, levo 44, and vaso 0.03.  He is somewhat dyssynchronous with ventilator despite analgesia with fent drip at 175 (and a 50 mcg bolus a short while ago). BP is marginal so will hold off on adding additional sedative agents at this time. I suspect as his acidosis improves on CRRT his strong respiratory drive will decrease and this will improve.  Spoke to RT about his ventilator settings and she identified that the patient was dramatically more synchronous on SIMV-PC mode, compared to other modes including regular PCV.   eICU Interventions  Will change to SIMV-PC mode as per RT's suggestion since he appears more comfortable and synchronous on this. Otherwise continue current management.     Intervention Category Major Interventions: Hypotension - evaluation and management;Acid-Base disturbance - evaluation and management  Charlott Rakes 04/23/2019, 8:46 PM

## 2019-04-23 NOTE — Progress Notes (Signed)
Per CCM's request, notified nephrology of worsening pH from prior blood gas. Received verbal order from Dr. Carolin Sicks to increase bicarb pre/post filter rate to 150 mL/hr.

## 2019-04-23 NOTE — Consult Note (Signed)
[facility] Consult Note    Assessment/ Plan:   1. Acute kidney injury w/ oligouria: Multifactorial in the setting of multisystem organ failure/hypotension (septic/cardiogenic shock), contrast/antibiotics, large intravascular hemolysis load s/p thrombectomy, and fluid overload w/ EF 10-15%. Cr 1.1 >2.64 >4.2 today (2/6) with metabolic acidosis. Baseline 1.2-1.3. Will proceed with IV Lasix, bicarb, and obtain renal U/S. If continues to escalate (suspected will given critical illness), can consider CRRT.    2. Shock, presumed septic: Intubated. Unclear source, possibly respiratory. Vanc/Zosyn and Levophed/vasopressin to maintain MAP >65, per PCCM and cardiology.    3. NICM  EF 10-15%: BNP 2,909. HF medications held d/t shock, on pressors but will require diuresis as above.  4. Left MCA stroke: S/p TPA and mechanical thrombectomy via IR, continued work up per neurology.  5. Severe transaminitis: >10,000. Likely in setting of shock. Per CCM.   6. Cocaine + tobacco use: Encourage cessation in future, esp as contributed to severe comorbidities.  7. T2DM: A1c 6.5, controlled. Per CCM.   HPI:   Carlos Brewer is a 34 year old gentleman with a history significant for NICM/CHF (EF 10-15%), cocaine abuse, depression, T2DM, and OSA who presented on 2/5 with worsening aphasia, found to have CVA due to left MCA embolism, subsequently treated with IV and mechanical thrombectomy.  Required ventilation due to respiratory failure in the setting of CVA, currently intubated since 2/5.  Hospital course further complicated by developing hypotension requiring pressors, increasing oxygen requirement, significant transaminitis, and febrile concerning for septic/cardiogenic shock.  On vancomycin and Zosyn for empiric coverage.  Currently under CCM care given critical illness, cardiology has also been consulted with neurology on board. Nephrology was consulted due to AKI with declining UOP.    Creatinine  4.2 today from 2.64 overnight and 1.1 on 2/5.  Baseline appears around 1.2-1.3, EGFR >60. I&O up 5L since admit.  U/a showing amber urine with > 300 proteinuria with large hematuria.  AST/ALT >10,000.  Fianc and mom are at bedside.  Already given Lasix 20 mg and 60 mg this morning with little urine output thus far.   Objective:   BP (!) 89/48   Pulse (!) 102   Temp (!) 102.2 F (39 C)   Resp (!) 30   Ht 6' 1"  (1.854 m)   Wt 124.2 kg   SpO2 98%   BMI 36.13 kg/m   Intake/Output Summary (Last 24 hours) at 04/23/2019 1109 Last data filed at 04/23/2019 1000 Gross per 24 hour  Intake 5162.09 ml  Output 965 ml  Net 4197.09 ml   Weight change:   Physical Exam: General: Ill-appearing, intubated, under sedation.  In reverse Trendelenburg position. HEENT: ETT tube in place  Cardiac: Tachycardic, regular rate Lungs: Tachypneic, bibasilar crackles, currently intubated on full ventilation support Abdomen: soft  GU: Scant pink-colored urine within catheter. Ext: Warm, dry, no pitting edema bilaterally Neuro: Unresponsive, sedated and intubated.   Imaging: CT Code Stroke CTA Head W/WO contrast  Result Date: 05/11/2019 CLINICAL DATA:  34 year old male code stroke presentation. Suspicious left MCA sylvian fissure branch hyperdensity. History of cardiac issues with decreased cardiac ejection fraction. EXAM: CT ANGIOGRAPHY HEAD AND NECK TECHNIQUE: Multidetector CT imaging of the head and neck was performed using the standard protocol during bolus administration of intravenous contrast. Multiplanar CT image reconstructions and MIPs were obtained to evaluate the vascular anatomy. Carotid stenosis measurements (when applicable) are obtained utilizing NASCET criteria, using the distal internal carotid diameter as the denominator. CONTRAST:  70m OMNIPAQUE IOHEXOL 350 MG/ML  SOLN COMPARISON:  Plain head CT 0402 hours today. FINDINGS: CTA NECK Skeleton: Lower cervical spine endplate degeneration. Mild reversal  of lordosis. Upper chest: Negative upper lungs and mediastinum. Other neck: Negative. Aortic arch: Suboptimal but adequate contrast bolus. Three vessel arch configuration with no arch atherosclerosis. Right carotid system: Negative right CCA and right carotid bifurcation. Tortuous but otherwise negative cervical right ICA. Left carotid system: Negative left CCA and left carotid bifurcation. Negative cervical left ICA. Vertebral arteries: The proximal right subclavian artery and cervical right vertebral artery appear normal. The right vertebral is dominant. Normal proximal left subclavian artery. The non dominant left vertebral artery is diminutive but remains patent to the cisterna magna. CTA HEAD Posterior circulation: The non dominant left vertebral artery seems to functionally terminates in PICA and/or an anterior spinal artery. The right vertebral artery supplies the basilar. No right vertebral or basilar artery stenosis identified. But both vessels are somewhat diminutive. Both posterior communicating arteries are present. Patent SCA origins. Overall fetal type PCAs. Bilateral PCA branches are grossly normal. Anterior circulation: Both ICA siphons are patent without plaque or stenosis. Normal posterior communicating artery origins. Patent carotid termini. Normal MCA and ACA origins. Bilateral ACA branches are within normal limits. Right MCA M1 and bifurcation are patent without stenosis. Right MCA branches are within normal limits. The left MCA M1 is patent and gives off an anterior temporal artery on series 9, image 118. Then at the expected location of the left MCA bifurcation or trifurcation the vessel is occluded (series 11, image 20, series 5, image 57). There is reconstitution of the dominant posterior division branch. Venous sinuses: Early contrast timing, not evaluated. Anatomic variants: Dominant right vertebral artery, the left appears to terminates in PICA and/or the anterior spinal artery. Fetal  type PCA origins. Review of the MIP images confirms the above findings IMPRESSION: 1. Positive for emergent large vessel occlusion at the Left MCA bifurcation. There is reconstitution of the dominant posterior division branch. This preliminary result was communicated to Dr. Leonel Ramsay at 0413 hours, and then we discussed by telephone. 2. Elsewhere negative CTA head and neck. Dominant right vertebral artery which supplies the basilar. Fetal type PCA origins. Electronically Signed   By: Genevie Ann M.D.   On: 05/05/2019 04:22   CT HEAD WO CONTRAST  Result Date: 04/23/2019 CLINICAL DATA:  Followup stroke.  Left MCA occlusion. EXAM: CT HEAD WITHOUT CONTRAST TECHNIQUE: Contiguous axial images were obtained from the base of the skull through the vertex without intravenous contrast. COMPARISON:  CT studies done yesterday. FINDINGS: Brain: No abnormality is seen affecting the brainstem or cerebellum. I think there is some artifactual low-density in the posterior fossa. Cannot completely rule out cerebellar infarctions but think that would be unlikely given the previous studies. Right hemisphere remains normal. I think there is a small area gray-white differentiation loss in the left frontal operculum. No hemorrhage or swelling. Vascular: No abnormal vascular finding. Skull: Negative Sinuses/Orbits: Clear/normal Other: None IMPRESSION: Small region of loss of gray-white differentiation in the left frontal operculum consistent with a fairly small area of acute infarction compared to the previous perfusion abnormality. No hemorrhage or mass effect. Areas of low-density in the cerebellum that I favor are artifactual. There did not seem to be any abnormalities in that region on the previous examinations. Electronically Signed   By: Nelson Chimes M.D.   On: 04/23/2019 05:01   CT Code Stroke CTA Neck W/WO contrast  Result Date: 04/18/2019 CLINICAL DATA:  34 year old male code  stroke presentation. Suspicious left MCA sylvian  fissure branch hyperdensity. History of cardiac issues with decreased cardiac ejection fraction. EXAM: CT ANGIOGRAPHY HEAD AND NECK TECHNIQUE: Multidetector CT imaging of the head and neck was performed using the standard protocol during bolus administration of intravenous contrast. Multiplanar CT image reconstructions and MIPs were obtained to evaluate the vascular anatomy. Carotid stenosis measurements (when applicable) are obtained utilizing NASCET criteria, using the distal internal carotid diameter as the denominator. CONTRAST:  63m OMNIPAQUE IOHEXOL 350 MG/ML SOLN COMPARISON:  Plain head CT 0402 hours today. FINDINGS: CTA NECK Skeleton: Lower cervical spine endplate degeneration. Mild reversal of lordosis. Upper chest: Negative upper lungs and mediastinum. Other neck: Negative. Aortic arch: Suboptimal but adequate contrast bolus. Three vessel arch configuration with no arch atherosclerosis. Right carotid system: Negative right CCA and right carotid bifurcation. Tortuous but otherwise negative cervical right ICA. Left carotid system: Negative left CCA and left carotid bifurcation. Negative cervical left ICA. Vertebral arteries: The proximal right subclavian artery and cervical right vertebral artery appear normal. The right vertebral is dominant. Normal proximal left subclavian artery. The non dominant left vertebral artery is diminutive but remains patent to the cisterna magna. CTA HEAD Posterior circulation: The non dominant left vertebral artery seems to functionally terminates in PICA and/or an anterior spinal artery. The right vertebral artery supplies the basilar. No right vertebral or basilar artery stenosis identified. But both vessels are somewhat diminutive. Both posterior communicating arteries are present. Patent SCA origins. Overall fetal type PCAs. Bilateral PCA branches are grossly normal. Anterior circulation: Both ICA siphons are patent without plaque or stenosis. Normal posterior  communicating artery origins. Patent carotid termini. Normal MCA and ACA origins. Bilateral ACA branches are within normal limits. Right MCA M1 and bifurcation are patent without stenosis. Right MCA branches are within normal limits. The left MCA M1 is patent and gives off an anterior temporal artery on series 9, image 118. Then at the expected location of the left MCA bifurcation or trifurcation the vessel is occluded (series 11, image 20, series 5, image 57). There is reconstitution of the dominant posterior division branch. Venous sinuses: Early contrast timing, not evaluated. Anatomic variants: Dominant right vertebral artery, the left appears to terminates in PICA and/or the anterior spinal artery. Fetal type PCA origins. Review of the MIP images confirms the above findings IMPRESSION: 1. Positive for emergent large vessel occlusion at the Left MCA bifurcation. There is reconstitution of the dominant posterior division branch. This preliminary result was communicated to Dr. KLeonel Ramsayat 0413 hours, and then we discussed by telephone. 2. Elsewhere negative CTA head and neck. Dominant right vertebral artery which supplies the basilar. Fetal type PCA origins. Electronically Signed   By: HGenevie AnnM.D.   On: 04/21/2019 04:22   CT CEREBRAL PERFUSION W CONTRAST  Result Date: 04/21/2019 CLINICAL DATA:  34year old male code stroke presentation with left MCA bifurcation occlusion on CTA. EXAM: CT PERFUSION BRAIN TECHNIQUE: Multiphase CT imaging of the brain was performed following IV bolus contrast injection. Subsequent parametric perfusion maps were calculated using RAPID software. CONTRAST:  557mOMNIPAQUE IOHEXOL 350 MG/ML SOLN COMPARISON:  CTA head and neck, plain CT earlier today. FINDINGS: CT Brain Perfusion Findings: CBF (<30%) Volume: 0 mL. There is a small 6-8 mL volume of less stringent and CBF <38% detected. Perfusion (Tmax>6.0s) volume: 60 mL. Hypoperfusion index 0.5 with 32 mL of T-max greater than 10s  detected. Mismatch Volume: 60 mL ASPECTS on noncontrast CT Head: 10 at 0402 hours today. Infarction  Location:Left frontal operculum, deep white matter. IMPRESSION: 1. CTP detects evidence of Left MCA territory oligemia concordant with the CTA findings. 2. No infarct core detected with standard CBF <30%. 3. 60 mL of penumbra detected with standard T-max > 6s. Electronically Signed   By: Genevie Ann M.D.   On: 04/18/2019 05:06   DG Chest Port 1 View  Result Date: 04/23/2019 CLINICAL DATA:  Fever EXAM: PORTABLE CHEST 1 VIEW COMPARISON:  05/11/2019 FINDINGS: An endotracheal tube with tip 4 cm above the carina, NG tube entering the stomach with tip off the field of view and LEFT IJ central venous catheter with tip overlying the mid SVC again noted. Cardiomegaly again identified. Increasing patchy bilateral airspace opacities noted. No definite pleural effusion or pneumothorax. IMPRESSION: Increasing patchy bilateral airspace opacities which may represent edema or infection. Electronically Signed   By: Margarette Canada M.D.   On: 04/23/2019 07:57   DG CHEST PORT 1 VIEW  Result Date: 05/13/2019 CLINICAL DATA:  Central line placement EXAM: PORTABLE CHEST 1 VIEW COMPARISON:  04/24/2019, 8:16 a.m. FINDINGS: Interval placement of like neck vascular catheter, tip positioned near the superior cavoatrial junction. Interval retraction of endotracheal tube, now positioned just above the thoracic inlet, approximately 6.5 cm above the carina. Interval placement of esophagogastric tube, tip and side port below the diaphragm. Unchanged cardiomegaly and mild, diffuse bilateral interstitial airspace opacity. IMPRESSION: 1. Interval placement of neck vascular catheter, tip positioned near the superior cavoatrial junction. 2. Interval retraction of endotracheal tube, now positioned just above the thoracic inlet, approximately 6.5 cm above the carina. 3. Interval placement of esophagogastric tube, tip and side port below the diaphragm. 4.  Stable cardiomegaly and diffuse bilateral interstitial airspace opacity, likely edema. Electronically Signed   By: Eddie Candle M.D.   On: 04/19/2019 19:58   DG Chest Port 1 View  Result Date: 04/25/2019 CLINICAL DATA:  Intubation. EXAM: PORTABLE CHEST 1 VIEW COMPARISON:  Chest x-ray 12/13/2018. FINDINGS: Endotracheal tube tip noted just above the carina. Proximal repositioning of 2-3 cm should be considered. Stable cardiomegaly. Diffuse bilateral pulmonary infiltrates/edema. Small left pleural effusion. No pneumothorax. IMPRESSION: 1. Endotracheal tube tip noted just above the carina. Proximal repositioning of 2-3 should be considered. 2. Cardiomegaly again noted. Diffuse bilateral pulmonary infiltrates/edema. Small left pleural effusion. These results will be called to the ordering clinician or representative by the Radiologist Assistant, and communication documented in the PACS or zVision Dashboard. Electronically Signed   By: Marcello Moores  Register   On: 04/26/2019 08:41   DG Abd Portable 1V  Result Date: 05/04/2019 CLINICAL DATA:  OG tube placement. EXAM: PORTABLE ABDOMEN - 1 VIEW COMPARISON:  None FINDINGS: OG tube tip is in the distal stomach. Bowel gas pattern is normal. Contrast seen in the renal collecting systems bilaterally. No bone abnormality. IMPRESSION: OG tube tip in the distal stomach. Electronically Signed   By: Lorriane Shire M.D.   On: 04/25/2019 10:07   IR PATIENT EVAL TECH 0-60 MINS  Result Date: 04/23/2019 Darnelle Spangle     04/23/2019 10:29 AM 11f sheath pulled from right groin using manual pressure and quikclot hemostasis gauze at 0938 hrs.  Hemostasis achieved at 1003 hrs.  Groin reviewed with patient's RN JAnderson Malta  PT pulse on right side is dopplerable.  Unable to palpate or doppler DP pulse.  RN JAnderson Maltaaware of pedal pulse situation on right side.  Quikclot hemostasis gauze needs to be removed no later than 1000 hrs 04/24/2019.  ECHOCARDIOGRAM COMPLETE  Result Date:  05/07/2019    ECHOCARDIOGRAM REPORT   Patient Name:   Carlos Brewer. Date of Exam: 05/11/2019 Medical Rec #:  [id number]                 Height:       73.0 in Accession #:    [id number]                Weight:       273.8 lb Date of Birth:  [birth year]                 BSA:          2.46 m Patient Age:    33 years                  BP:           121/95 mmHg Patient Gender: M                         HR:           87 bpm. Exam Location:  Inpatient Procedure: 2D Echo Indications:    stroke  History:        Patient has prior history of Echocardiogram examinations, most                 recent 07/13/2018. Chronic kidney disease; Risk Factors:Sleep                 Apnea and substance abuse.  Sonographer:    Johny Chess Referring Phys: [phone] Catron KIRKPATRICK  Sonographer Comments: Echo performed with patient supine and on artificial respirator. IMPRESSIONS  1. Left ventricular ejection fraction, by visual estimation, is <20%. The left ventricle has severely decreased function. There is no left ventricular hypertrophy.  2. Apex is not well-visualized. In setting of stroke with severe LV systolic dysfunction, recommend repeat TTE with contrast to rule out LV apical thrombus  3. Severely dilated left ventricular internal cavity size.  4. Left ventricular diastolic parameters are consistent with Grade III diastolic dysfunction (restrictive).  5. Elevated left atrial pressure.  6. The left ventricle demonstrates global hypokinesis.  7. Global right ventricle has normal systolic function.The right ventricular size is mildly enlarged.  8. Left atrial size was moderately dilated.  9. Right atrial size was normal. 10. The mitral valve is normal in structure. Mild mitral valve regurgitation. 11. The tricuspid valve is normal in structure. Tricuspid valve regurgitation is trivial. 12. The aortic valve is normal in structure. Aortic valve regurgitation is not visualized. No evidence of aortic valve sclerosis or stenosis. 13. The  pulmonic valve was not well visualized. Pulmonic valve regurgitation is not visualized. FINDINGS  Left Ventricle: Left ventricular ejection fraction, by visual estimation, is <20%. The left ventricle has severely decreased function. The left ventricle demonstrates global hypokinesis. The left ventricular internal cavity size was severely dilated left ventricle. There is no left ventricular hypertrophy. Left ventricular diastolic parameters are consistent with Grade III diastolic dysfunction (restrictive). Elevated left atrial pressure. Right Ventricle: The right ventricular size is mildly enlarged. No increase in right ventricular wall thickness. Global RV systolic function is has normal systolic function. Left Atrium: Left atrial size was moderately dilated. Right Atrium: Right atrial size was normal in size Pericardium: There is no evidence of pericardial effusion. Mitral Valve: The mitral valve is normal in structure. Mild mitral valve regurgitation. Tricuspid Valve: The tricuspid valve is normal in structure. Tricuspid valve  regurgitation is trivial. Aortic Valve: The aortic valve is normal in structure. Aortic valve regurgitation is not visualized. The aortic valve is structurally normal, with no evidence of sclerosis or stenosis. Pulmonic Valve: The pulmonic valve was not well visualized. Pulmonic valve regurgitation is not visualized. Pulmonic regurgitation is not visualized. Aorta: The aortic root is normal in size and structure. Venous: IVC assessment for right atrial pressure unable to be performed due to mechanical ventilation. IAS/Shunts: No atrial level shunt detected by color flow Doppler.  LEFT VENTRICLE PLAX 2D LVIDd:         7.20 cm       Diastology LVIDs:         6.40 cm       LV e' lateral:   6.53 cm/s LV PW:         1.00 cm       LV E/e' lateral: 12.5 LV IVS:        0.80 cm       LV e' medial:    3.92 cm/s LVOT diam:     2.00 cm       LV E/e' medial:  20.8 LV SV:         64 ml LV SV Index:    24.74 LVOT Area:     3.14 cm  LV Volumes (MOD) LV area d, A4C:    56.30 cm LV area s, A4C:    49.80 cm LV major d, A4C:   10.20 cm LV major s, A4C:   10.00 cm LV vol d, MOD A4C: 255.0 ml LV vol s, MOD A4C: 208.0 ml LV SV MOD A4C:     255.0 ml RIGHT VENTRICLE RV S prime:     11.20 cm/s TAPSE (M-mode): 1.8 cm LEFT ATRIUM              Index       RIGHT ATRIUM           Index LA diam:        4.30 cm  1.75 cm/m  RA Area:     22.40 cm LA Vol (A2C):   104.0 ml 42.29 ml/m RA Volume:   70.50 ml  28.67 ml/m LA Vol (A4C):   97.1 ml  39.49 ml/m LA Biplane Vol: 104.0 ml 42.29 ml/m  AORTIC VALVE LVOT Vmax:   47.00 cm/s LVOT Vmean:  34.500 cm/s LVOT VTI:    0.070 m  AORTA Ao Root diam: 3.00 cm MITRAL VALVE MV Area (PHT): 4.06 cm            SHUNTS MV PHT:        54.23 msec          Systemic VTI:  0.07 m MV Decel Time: 187 msec            Systemic Diam: 2.00 cm MR PISA:        0.57 cm MR PISA Radius: 0.30 cm MV E velocity: 81.40 cm/s 103 cm/s  Oswaldo Milian MD Electronically signed by Oswaldo Milian MD Signature Date/Time: 05/02/2019/4:40:48 PM    Final    CT HEAD CODE STROKE WO CONTRAST  Result Date: 05/08/2019 CLINICAL DATA:  Code stroke.  34 year old male EXAM: CT HEAD WITHOUT CONTRAST TECHNIQUE: Contiguous axial images were obtained from the base of the skull through the vertex without intravenous contrast. COMPARISON:  Head CT 08/28/2018. FINDINGS: Brain: No midline shift, mass effect, or evidence of intracranial mass lesion. No ventriculomegaly. No acute intracranial hemorrhage identified. Evidence of  chronic cortical encephalomalacia in the right frontal operculum (series 3, image 23). No superimposed acute cortically based infarct identified. Vascular: Suspicious asymmetric hyperdensity of a left MCA branch in the sylvian fissure on series 3, image 16. Skull: Stable, negative. Sinuses/Orbits: Visualized paranasal sinuses and mastoids are stable and well pneumatized. Other: No acute orbit or scalp  soft tissue findings. ASPECTS [facility] Stroke Program Early CT Score) Total score (0-10 with 10 being normal): 10 (left MCA territory, chronic cortical encephalomalacia right frontal operculum). IMPRESSION: 1. Asymmetric hyperdensity of a Left MCA branch in the Sylvian fissure suspicious for emergent large vessel occlusion in the setting. 2. No associated acute cortically based infarct, ASPECTS 10. No acute hemorrhage. 3. Chronic right frontal operculum encephalomalacia. 4. These results were communicated to Dr. Leonel Ramsay at 4:06 am on 05/08/2019 by text page via the [facility] messaging system. Electronically Signed   By: Genevie Ann M.D.   On: 05/05/2019 04:10    Labs: [facility] Recent Labs  Lab 04/26/2019 0355 05/07/2019 0355 04/20/2019 0430 05/05/2019 0430 04/23/2019 0905 05/01/2019 0905 04/20/2019 2108 05/04/2019 2123 04/23/2019 2208 05/09/2019 2338 04/23/19 0002 04/23/19 0608 04/23/19 1022  NA 136  --  136  --  136  --  134*  --   --  139 139  --  140  K 3.4*   < > 3.4*   < > 4.2   < > 7.3* 7.2* 5.6* 5.1 5.0 5.0 4.7  CL 98  --  102  --   --   --   --   --   --   --  109  --   --   CO2 24  --   --   --   --   --   --   --   --   --  15*  --   --   GLUCOSE 153*  --  149*  --   --   --   --   --   --   --  220*  --   --   BUN 17  --  17  --   --   --   --   --   --   --  23*  --   --   CREATININE 1.17  --  1.10  --   --   --   --   --   --   --  2.64*  --   --   CALCIUM 9.5  --   --   --   --   --   --   --   --   --  7.7*  --   --    < > = values in this interval not displayed.   CBC Recent Labs  Lab 04/25/2019 0355 05/12/2019 0430 05/02/2019 2338 04/23/19 0208 04/23/19 0945 04/23/19 1022  WBC 7.2  --   --  21.9* 16.2*  --   NEUTROABS 4.3  --   --  19.2* 13.5*  --   HGB 14.9   < > 16.3 15.4 14.6 13.9  HCT 42.8   < > 48.0 45.2 44.0 41.0  MCV 89.0  --   --  90.8 92.6  --   PLT 243  --   --  171 145*  --    < > = values in this interval not displayed.    Medications:    . atorvastatin  80 mg Per Tube  q1800  . chlorhexidine gluconate (MEDLINE  KIT)  15 mL Mouth Rinse BID  . Chlorhexidine Gluconate Cloth  6 each Topical Q0600  . digoxin  0.125 mg Per Tube Daily  . fentaNYL (SUBLIMAZE) injection  50 mcg Intravenous Once  . FLUoxetine  20 mg Per Tube Daily  . furosemide  60 mg Intravenous Once  . hydrocortisone sod succinate (SOLU-CORTEF) inj  50 mg Intravenous Q6H  . insulin aspart  0-15 Units Subcutaneous Q4H  . mouth rinse  15 mL Mouth Rinse 10 times per day  . mupirocin ointment  1 application Nasal BID  . pantoprazole (PROTONIX) IV  40 mg Intravenous Daily  . sodium chloride flush  10-40 mL Intracatheter Kershaw, DO  Family Medicine PGY-2  04/23/2019, 11:09 AM

## 2019-04-23 NOTE — Procedures (Signed)
Hemodialysis Insertion Procedure Note Carlos Brewer. [id number] 01-24-1986  Procedure: Insertion of Hemodialysis Catheter Type: 3 port  Indications: Hemodialysis   Procedure Details Consent: Risks of procedure as well as the alternatives and risks of each were explained to the (patient/caregiver).  Consent for procedure obtained. Time Out: Verified patient identification, verified procedure, site/side was marked, verified correct patient position, special equipment/implants available, medications/allergies/relevent history reviewed, required imaging and test results available.  Performed  Maximum sterile technique was used including antiseptics, cap, gloves, gown, hand hygiene, mask and sheet. Skin prep: Chlorhexidine; local anesthetic administered A antimicrobial bonded/coated triple lumen catheter was placed in the right internal jugular vein using the Seldinger technique. Ultrasound guidance used.Yes.   Catheter placed to 16 cm. Blood aspirated via all 3 ports and then flushed x 3. Line sutured x 2 and dressing applied. 2 units ffp for INR 3.1 Evaluation Blood flow good Complications: No apparent complications Patient did tolerate procedure well. Chest X-ray ordered to verify placement.  CXR: pending.     [name] [name] ACNP Acute Care Nurse Practitioner [facility] Pulmonary/Critical Care Please consult Amion 04/23/2019, 2:40 PM

## 2019-04-23 NOTE — Consult Note (Signed)
CONSULTATION NOTE   Patient Name: Carlos Brewer. Date of Encounter: 04/23/2019 Cardiologist: Ida Rogue, MD  Chief Complaint   None (intubated/sedated)  Patient Profile   34 yo male with history of NICM and myocarditis, followed by the advanced CHF service, presented with acute MCA stroke treated with tPA and IR thrombectomy, now hypotensive, concerning for possible septic versus cardiogenic shock  HPI   Carlos Brewer. is a 34 y.o. male who is being seen today for the evaluation of cardiogenic shock at the request of Dr. Halford Chessman. This is a 34 year old male patient of Dr. Rockey Situ, followed by Dr. Haroldine Laws in the advanced heart failure service for NICM and history of myocarditis in 2018.  His LVEF has been as low as 10 to 15% and was admitted with volume overload last in April to May 2020, requiring diuresis with IV Lasix and metolazone and then transitioned over to torsemide.  His dry weight at discharge was 276 pounds.  He was seen in follow-up in June via telehealth visit.  Weight has stayed fairly stable although he had difficulty affording his medications.  He was diagnosed with Covid and September but subsequently has had numerous negative tests.  He presented yesterday with weakness and slurred speech and was found to have acute MCA stroke.  He was given TPA as well as had an IR intervention.  Repeat echo was performed showing EF remains below 20% with global hypokinesis.  Contrast was not given and the LV apex was not well visualized to rule out thrombus (however the patient was given systemic TPA).  Left ventricle was severely dilated with grade 3 diastolic dysfunction.  Cardiology was consulted today as the patient may have symptoms concerning for cardiogenic versus septic shock.  He has been febrile in fact is T-max is up to 103.2.  Initially he was on high-dose Neo-Synephrine however has been transitioned to Levophed and vasopressin by critical care.  He is  intubated and remains critically ill.  I's/O's indicate he is volume up about 5 L.  Weight on 05/07/2019 was 124 kg (272.8 pounds). He was given lasix today with little urine output - urine is bloody with clots, ?urologic source of possible sepsis. CVP 20 supine, now 9 in reverse trendelenburg.  Has a left IJ catheter. On milrinone and levophed, MAP now 65 via ART line.  PMHx   Past Medical History:  Diagnosis Date  . CHF (congestive heart failure) ([place])   . Cocaine abuse ([place])   . HFrEF (heart failure with reduced ejection fraction) ([place])    a. 10/2016 Echo: EF 15-20%, Gr2 DD, mildly dil LA/RA.  Marland Kitchen History of cardiac cath    a. 10/2016 Cath: LM nl, LAD min irregs, LCX nl, RCA nl, EF 15%.  . Morbid obesity ([place])   . Myocarditis ([place])    a. 10/2016 Admit w/ CHF and trop elevation; b. 10/2016 Echo: EF 15-20%; c. 10/2016 Cath: Min irregs in LAD otw nl cors, EF 15%, glob HK.  Marland Kitchen NICM (nonischemic cardiomyopathy) ([place])    a. 10/2016 Echo: EF 15-20%, Gr2 DD; b. 10/2016 Cath: Nl cors.  . Palpitations   . Sleep apnea    USES CPAP  . Tobacco abuse     Past Surgical History:  Procedure Laterality Date  . CORONARY ANGIOPLASTY    . IR PATIENT EVAL TECH 0-60 MINS  04/23/2019  . NO PAST SURGERIES    . RIGHT/LEFT HEART CATH AND CORONARY ANGIOGRAPHY N/A 10/27/2016   Procedure: RIGHT/LEFT  HEART CATH AND CORONARY ANGIOGRAPHY;  Surgeon: Wellington Hampshire, MD;  Location: [facility];  Service: Cardiovascular;  Laterality: N/A;  . WISDOM TOOTH EXTRACTION  2008    FAMHx   Family History  Problem Relation Age of Onset  . Healthy Mother   . Heart failure Father        EF is 35%    SOCHx    reports that he has been smoking cigarettes. He has been smoking about 0.50 packs per day. He has never used smokeless tobacco. He reports current alcohol use. He reports previous drug use. Drug: Cocaine.  Outpatient Medications   No current facility-administered medications on file prior to encounter.    Current Outpatient Medications on File Prior to Encounter  Medication Sig Dispense Refill  . albuterol (ACCUNEB) 1.25 MG/3ML nebulizer solution Take 3 mLs by nebulization 4 (four) times daily as needed for wheezing or shortness of breath.    Marland Kitchen albuterol (VENTOLIN HFA) 108 (90 Base) MCG/ACT inhaler Inhale 1-2 puffs into the lungs every 6 (six) hours as needed for wheezing or shortness of breath. 1 Inhaler 0  . aspirin EC 81 MG EC tablet Take 1 tablet (81 mg total) by mouth daily. (Patient taking differently: Take 81 mg by mouth at bedtime. ) 30 tablet 2  . atorvastatin (LIPITOR) 40 MG tablet Take 1 tablet (40 mg total) by mouth daily at 6 PM. 30 tablet 6  . bacitracin ointment Apply 1 application topically 2 (two) times daily. (Patient taking differently: Apply 1 application topically 2 (two) times daily as needed for wound care. ) 120 g 0  . carvedilol (COREG) 3.125 MG tablet Take 1 tablet (3.125 mg total) by mouth 2 (two) times daily with a meal. 60 tablet 6  . digoxin (LANOXIN) 0.125 MG tablet Take 1 tablet (0.125 mg total) by mouth daily. 30 tablet 6  . FLUoxetine (PROZAC) 20 MG capsule Take 1 capsule (20 mg total) by mouth daily. 30 capsule 0  . insulin aspart (NOVOLOG) 100 UNIT/ML injection Inject 10 Units into the skin 3 (three) times daily before meals.    . insulin detemir (LEVEMIR) 100 UNIT/ML injection Inject 40 Units into the skin at bedtime.     . metolazone (ZAROXOLYN) 2.5 MG tablet Take 1 tablet (2.5 mg total) by mouth daily as needed for up to 30 days. Take metolazone 2.75m x1 only for 5pound weight gain, please take potassium 228m x1 with metolazone. (Patient taking differently: Take 2.5 mg by mouth daily as needed (for 5 lb weight gain (take additional potassium 20 meq with each dose). ) 30 tablet 0  . Multiple Vitamin (MULTIVITAMIN WITH MINERALS) TABS tablet Take 1 tablet by mouth daily with lunch.    . pantoprazole (PROTONIX) 40 MG tablet Take 40 mg by mouth daily as needed  (acid reflux/indigestion).     . potassium chloride SA (K-DUR) 20 MEQ tablet Take 1 tablet (20 mEq total) by mouth daily. Can take extra one if taking metolazone. (Patient taking differently: Take 20 mEq by mouth See admin instructions. Take one tablet (20 meq) by mouth every morning, also take an extra tablet (20 meq) with each dose of metolazone) 40 tablet 6  . sacubitril-valsartan (ENTRESTO) 24-26 MG Take 1 tablet by mouth 2 (two) times daily. 60 tablet 6  . spironolactone (ALDACTONE) 25 MG tablet Take 1 tablet (25 mg total) by mouth daily. (Patient taking differently: Take 25 mg by mouth at bedtime. ) 30 tablet 6  . torsemide (  DEMADEX) 20 MG tablet Take 2 tablets (40 mg total) by mouth 2 (two) times daily. (Patient taking differently: Take 40 mg by mouth daily. ) 120 tablet 6  . Vitamin D, Ergocalciferol, (DRISDOL) 1.25 MG (50000 UNIT) CAPS capsule Take 50,000 Units by mouth every Thursday.      Inpatient Medications    Scheduled Meds: . atorvastatin  80 mg Per Tube q1800  . chlorhexidine gluconate (MEDLINE KIT)  15 mL Mouth Rinse BID  . Chlorhexidine Gluconate Cloth  6 each Topical Q0600  . digoxin  0.125 mg Per Tube Daily  . fentaNYL (SUBLIMAZE) injection  50 mcg Intravenous Once  . FLUoxetine  20 mg Per Tube Daily  . hydrocortisone sod succinate (SOLU-CORTEF) inj  50 mg Intravenous Q6H  . insulin aspart  0-15 Units Subcutaneous Q4H  . mouth rinse  15 mL Mouth Rinse 10 times per day  . mupirocin ointment  1 application Nasal BID  . pantoprazole (PROTONIX) IV  40 mg Intravenous Daily  . sodium chloride flush  10-40 mL Intracatheter Q12H    Continuous Infusions: . sodium chloride    . sodium chloride 30 mL/hr at 04/23/19 9628  . dexmedetomidine (PRECEDEX) IV infusion 1.2 mcg/kg/hr (04/23/19 0545)  . epinephrine    . fentaNYL infusion INTRAVENOUS 50 mcg/hr (04/23/19 0836)  . midazolam    . niCARDipine    . norepinephrine (LEVOPHED) Adult infusion 22 mcg/min (04/23/19 0745)  .  piperacillin-tazobactam (ZOSYN)  IV    . vancomycin    . vasopressin (PITRESSIN) infusion - *FOR SHOCK* 0.03 Units/min (04/23/19 1043)    PRN Meds: bisacodyl, docusate, fentaNYL, labetalol **AND** niCARDipine, midazolam, sodium chloride flush   ALLERGIES   No Known Allergies  ROS   Review of systems not obtained due to patient factors.  Vitals   Vitals:   04/23/19 0915 04/23/19 0928 04/23/19 0930 04/23/19 1000  BP: 134/82  109/67 (!) 89/48  Pulse: (!) 102 (!) 106 (!) 105 (!) 102  Resp: (!) 32 (!) 30 (!) 35 (!) 30  Temp:  (!) 100.6 F (38.1 C)  (!) 102.2 F (39 C)  TempSrc:      SpO2: 99%  100% 100%  Weight:      Height:        Intake/Output Summary (Last 24 hours) at 04/23/2019 1050 Last data filed at 04/23/2019 0700 Gross per 24 hour  Intake 5415.77 ml  Output 875 ml  Net 4540.77 ml   Filed Weights   05/13/2019 0737  Weight: 124.2 kg    Physical Exam   General appearance: intubated, sedated, no distress on vent Neck: no carotid bruit, no JVD and thyroid not enlarged, symmetric, no tenderness/mass/nodules Lungs: diminished breath sounds bilaterally and rales bibasilar Heart: regular tachycardia, no murmur Abdomen: soft, non-tender; bowel sounds normal; no masses,  no organomegaly Extremities: cool, no edema Pulses: faint pulses Skin: cool, dry, pale Neurologic: Mental status: intubated, sedated on vent Psych: Could not assess  Labs   Results for orders placed or performed during the hospital encounter of 05/11/2019 (from the past 48 hour(s))  Ethanol     Status: None   Collection Time: 04/19/2019  3:55 AM  Result Value Ref Range   Alcohol, Ethyl (B) <10 <10 mg/dL    Comment: (NOTE) Lowest detectable limit for serum alcohol is 10 mg/dL. For medical purposes only. Performed at [facility], [place] [address], [place], [place] [address]   Protime-INR     Status: None   Collection Time: 05/04/2019  3:55 AM  Result Value Ref Range   Prothrombin Time 12.6  11.4 - 15.2 seconds   INR 1.0 0.8 - 1.2    Comment: (NOTE) INR goal varies based on device and disease states. Performed at [facility], [place] [address] [place], [place] [address]   APTT     Status: None   Collection Time: 04/25/2019  3:55 AM  Result Value Ref Range   aPTT 24 24 - 36 seconds    Comment: Performed at [facility] [address], [place], [place] [address]  CBC     Status: None   Collection Time: 05/13/2019  3:55 AM  Result Value Ref Range   WBC 7.2 4.0 - 10.5 K/uL   RBC 4.81 4.22 - 5.81 MIL/uL   Hemoglobin 14.9 13.0 - 17.0 g/dL   HCT 42.8 39.0 - 52.0 %   MCV 89.0 80.0 - 100.0 fL   MCH 31.0 26.0 - 34.0 pg   MCHC 34.8 30.0 - 36.0 g/dL   RDW 12.1 11.5 - 15.5 %   Platelets 243 150 - 400 K/uL    Comment: REPEATED TO VERIFY   nRBC 0.0 0.0 - 0.2 %    Comment: Performed at [facility], [place] [address] [place], [place] [address]  Differential     Status: None   Collection Time: 05/10/2019  3:55 AM  Result Value Ref Range   Neutrophils Relative % 60 %   Neutro Abs 4.3 1.7 - 7.7 K/uL   Lymphocytes Relative 31 %   Lymphs Abs 2.2 0.7 - 4.0 K/uL   Monocytes Relative 8 %   Monocytes Absolute 0.5 0.1 - 1.0 K/uL   Eosinophils Relative 1 %   Eosinophils Absolute 0.1 0.0 - 0.5 K/uL   Basophils Relative 0 %   Basophils Absolute 0.0 0.0 - 0.1 K/uL   Immature Granulocytes 0 %   Abs Immature Granulocytes 0.02 0.00 - 0.07 K/uL    Comment: Performed at [facility], [place] [address] [place], [place] [address]  Comprehensive metabolic panel     Status: Abnormal   Collection Time: 04/18/2019  3:55 AM  Result Value Ref Range   Sodium 136 135 - 145 mmol/L   Potassium 3.4 (L) 3.5 - 5.1 mmol/L   Chloride 98 98 - 111 mmol/L   CO2 24 22 - 32 mmol/L   Glucose, Bld 153 (H) 70 - 99 mg/dL   BUN 17 6 - 20 mg/dL   Creatinine, Ser 1.17 0.61 - 1.24 mg/dL   Calcium 9.5 8.9 - 10.3 mg/dL   Total Protein 6.6 6.5 - 8.1 g/dL   Albumin 3.4 (L) 3.5 - 5.0 g/dL   AST 21 15 - 41  U/L   ALT 23 0 - 44 U/L   Alkaline Phosphatase 95 38 - 126 U/L   Total Bilirubin 0.5 0.3 - 1.2 mg/dL   GFR calc non Af Amer >60 >60 mL/min   GFR calc Af Amer >60 >60 mL/min   Anion gap 14 5 - 15    Comment: Performed at [facility] [address], [place], [place] [address]  Hemoglobin A1c     Status: Abnormal   Collection Time: 05/14/2019  3:55 AM  Result Value Ref Range   Hgb A1c MFr Bld 6.5 (H) 4.8 - 5.6 %    Comment: (NOTE) Pre diabetes:          5.7%-6.4% Diabetes:              >  6.4% Glycemic control for   <7.0% adults with diabetes    Mean Plasma Glucose 139.85 mg/dL    Comment: Performed at [facility] [address] [place], [place] [address]  I-stat chem 8, ED     Status: Abnormal   Collection Time: 04/29/2019  4:30 AM  Result Value Ref Range   Sodium 136 135 - 145 mmol/L   Potassium 3.4 (L) 3.5 - 5.1 mmol/L   Chloride 102 98 - 111 mmol/L   BUN 17 6 - 20 mg/dL   Creatinine, Ser 1.10 0.61 - 1.24 mg/dL   Glucose, Bld 149 (H) 70 - 99 mg/dL   Calcium, Ion 1.21 1.15 - 1.40 mmol/L   TCO2 28 22 - 32 mmol/L   Hemoglobin 13.9 13.0 - 17.0 g/dL   HCT 41.0 39.0 - 52.0 %  Respiratory Panel by RT PCR (Flu A&B, Covid) - Nasopharyngeal Swab     Status: None   Collection Time: 05/13/2019  4:42 AM   Specimen: Nasopharyngeal Swab  Result Value Ref Range   SARS Coronavirus 2 by RT PCR NEGATIVE NEGATIVE    Comment: (NOTE) SARS-CoV-2 target nucleic acids are NOT DETECTED. The SARS-CoV-2 RNA is generally detectable in upper respiratoy specimens during the acute phase of infection. The lowest concentration of SARS-CoV-2 viral copies this assay can detect is 131 copies/mL. A negative result does not preclude SARS-Cov-2 infection and should not be used as the sole basis for treatment or other patient management decisions. A negative result may occur with  improper specimen collection/handling, submission of specimen other than nasopharyngeal swab, presence of viral  mutation(s) within the areas targeted by this assay, and inadequate number of viral copies (<131 copies/mL). A negative result must be combined with clinical observations, patient history, and epidemiological information. The expected result is Negative. Fact Sheet for Patients:  PinkCheek.be Fact Sheet for Healthcare Providers:  GravelBags.it This test is not yet ap proved or cleared by the [place] FDA and  has been authorized for detection and/or diagnosis of SARS-CoV-2 by FDA under an Emergency Use Authorization (EUA). This EUA will remain  in effect (meaning this test can be used) for the duration of the COVID-19 declaration under Section 564(b)(1) of the Act, 21 U.S.C. section 360bbb-3(b)(1), unless the authorization is terminated or revoked sooner.    Influenza A by PCR NEGATIVE NEGATIVE   Influenza B by PCR NEGATIVE NEGATIVE    Comment: (NOTE) The Xpert Xpress SARS-CoV-2/FLU/RSV assay is intended as an aid in  the diagnosis of influenza from Nasopharyngeal swab specimens and  should not be used as a sole basis for treatment. Nasal washings and  aspirates are unacceptable for Xpert Xpress SARS-CoV-2/FLU/RSV  testing. Fact Sheet for Patients: PinkCheek.be Fact Sheet for Healthcare Providers: GravelBags.it This test is not yet approved or cleared by the [place] FDA and  has been authorized for detection and/or diagnosis of SARS-CoV-2 by  FDA under an Emergency Use Authorization (EUA). This EUA will remain  in effect (meaning this test can be used) for the duration of the  Covid-19 declaration under Section 564(b)(1) of the Act, 21  U.S.C. section 360bbb-3(b)(1), unless the authorization is  terminated or revoked. Performed at [facility], [place] [address], [place], [place] [address]   I-STAT 7, (LYTES, BLD GAS, ICA, H+H)     Status: Abnormal    Collection Time: 05/05/2019  9:05 AM  Result Value Ref Range   pH, Arterial 7.286 (L) 7.350 - 7.450  pCO2 arterial 47.1 32.0 - 48.0 mmHg   pO2, Arterial 103.0 83.0 - 108.0 mmHg   Bicarbonate 22.4 20.0 - 28.0 mmol/L   TCO2 24 22 - 32 mmol/L   O2 Saturation 97.0 %   Acid-base deficit 4.0 (H) 0.0 - 2.0 mmol/L   Sodium 136 135 - 145 mmol/L   Potassium 4.2 3.5 - 5.1 mmol/L   Calcium, Ion 1.22 1.15 - 1.40 mmol/L   HCT 43.0 39.0 - 52.0 %   Hemoglobin 14.6 13.0 - 17.0 g/dL   Patient temperature HIDE    Collection site ARTERIAL LINE    Drawn by RT    Sample type ARTERIAL   Glucose, capillary     Status: Abnormal   Collection Time: 05/02/2019  9:10 AM  Result Value Ref Range   Glucose-Capillary 216 (H) 70 - 99 mg/dL  Glucose, capillary     Status: Abnormal   Collection Time: 05/05/2019 11:02 AM  Result Value Ref Range   Glucose-Capillary 249 (H) 70 - 99 mg/dL  TSH     Status: None   Collection Time: 04/28/2019 12:40 PM  Result Value Ref Range   TSH 2.237 0.350 - 4.500 uIU/mL    Comment: Performed by a 3rd Generation assay with a functional sensitivity of <=0.01 uIU/mL. Performed at [facility], [place] [address], [place], [place] [address]   MRSA PCR Screening     Status: Abnormal   Collection Time: 05/15/2019  1:56 PM   Specimen: Nasal Mucosa; Nasopharyngeal  Result Value Ref Range   MRSA by PCR POSITIVE (A) NEGATIVE    Comment:        The GeneXpert MRSA Assay (FDA approved for NASAL specimens only), is one component of a comprehensive MRSA colonization surveillance program. It is not intended to diagnose MRSA infection nor to guide or monitor treatment for MRSA infections. RESULT CALLED TO, READ BACK BY AND VERIFIED WITH: Adrienne Mocha RN 16:20 05/09/2019 ([email or web address]) Performed at [facility], [place] [address], [place], [place] [address]   Glucose, capillary     Status: Abnormal   Collection Time: 05/12/2019  4:07 PM  Result Value Ref Range   Glucose-Capillary 141 (H) 70 - 99  mg/dL  Glucose, capillary     Status: Abnormal   Collection Time: 04/28/2019  7:31 PM  Result Value Ref Range   Glucose-Capillary 188 (H) 70 - 99 mg/dL  I-STAT 7, (LYTES, BLD GAS, ICA, H+H)     Status: Abnormal   Collection Time: 05/07/2019  9:08 PM  Result Value Ref Range   pH, Arterial 7.336 (L) 7.350 - 7.450   pCO2 arterial 24.1 (L) 32.0 - 48.0 mmHg   pO2, Arterial 67.0 (L) 83.0 - 108.0 mmHg   Bicarbonate 13.0 (L) 20.0 - 28.0 mmol/L   TCO2 14 (L) 22 - 32 mmol/L   O2 Saturation 93.0 %   Acid-base deficit 11.0 (H) 0.0 - 2.0 mmol/L   Sodium 134 (L) 135 - 145 mmol/L   Potassium 7.3 (HH) 3.5 - 5.1 mmol/L   Calcium, Ion 1.02 (L) 1.15 - 1.40 mmol/L   HCT 44.0 39.0 - 52.0 %   Hemoglobin 15.0 13.0 - 17.0 g/dL   Patient temperature 96.6 F    Collection site ARTERIAL LINE    Drawn by RT    Sample type ARTERIAL    Comment NOTIFIED PHYSICIAN   Potassium     Status: Abnormal   Collection Time: 04/29/2019  9:23 PM  Result Value Ref Range  Potassium 7.2 (HH) 3.5 - 5.1 mmol/L    Comment: CRITICAL RESULT CALLED TO, READ BACK BY AND VERIFIED WITH: RN R COE _0  05/07/2019 BY S GEZAHEGN Performed at [facility], [place] [address] [place], [place] [address]   Magnesium     Status: None   Collection Time: 04/18/2019  9:23 PM  Result Value Ref Range   Magnesium 2.1 1.7 - 2.4 mg/dL    Comment: Performed at [facility], [place] [address] [place], [place] [address]  Potassium     Status: Abnormal   Collection Time: 05/14/2019 10:08 PM  Result Value Ref Range   Potassium 5.6 (H) 3.5 - 5.1 mmol/L    Comment: Performed at [facility] [address] [place], [place] [address]  Digoxin level     Status: Abnormal   Collection Time: 04/21/2019 10:08 PM  Result Value Ref Range   Digoxin Level 0.4 (L) 0.8 - 2.0 ng/mL    Comment: Performed at [facility], [place] [address], [place], [place] [address]  Glucose, capillary     Status: Abnormal   Collection Time: 05/13/2019 11:24 PM  Result Value  Ref Range   Glucose-Capillary 218 (H) 70 - 99 mg/dL  I-STAT 7, (LYTES, BLD GAS, ICA, H+H)     Status: Abnormal   Collection Time: 04/25/2019 11:38 PM  Result Value Ref Range   pH, Arterial 7.283 (L) 7.350 - 7.450   pCO2 arterial 38.6 32.0 - 48.0 mmHg   pO2, Arterial 46.0 (L) 83.0 - 108.0 mmHg   Bicarbonate 17.8 (L) 20.0 - 28.0 mmol/L   TCO2 19 (L) 22 - 32 mmol/L   O2 Saturation 70.0 %   Acid-base deficit 7.0 (H) 0.0 - 2.0 mmol/L   Sodium 139 135 - 145 mmol/L   Potassium 5.1 3.5 - 5.1 mmol/L   Calcium, Ion 0.99 (L) 1.15 - 1.40 mmol/L   HCT 48.0 39.0 - 52.0 %   Hemoglobin 16.3 13.0 - 17.0 g/dL   Patient temperature 102.5 F    Collection site ARTERIAL LINE    Drawn by RT    Sample type ARTERIAL   Comprehensive metabolic panel     Status: Abnormal   Collection Time: 04/23/19 12:02 AM  Result Value Ref Range   Sodium 139 135 - 145 mmol/L   Potassium 5.0 3.5 - 5.1 mmol/L   Chloride 109 98 - 111 mmol/L   CO2 15 (L) 22 - 32 mmol/L   Glucose, Bld 220 (H) 70 - 99 mg/dL   BUN 23 (H) 6 - 20 mg/dL   Creatinine, Ser 2.64 (H) 0.61 - 1.24 mg/dL    Comment: DELTA CHECK NOTED   Calcium 7.7 (L) 8.9 - 10.3 mg/dL   Total Protein 6.1 (L) 6.5 - 8.1 g/dL   Albumin 2.9 (L) 3.5 - 5.0 g/dL   AST >10,000 (H) 15 - 41 U/L    Comment: RESULTS CONFIRMED BY MANUAL DILUTION   ALT 11,085 (H) 0 - 44 U/L    Comment: RESULTS CONFIRMED BY MANUAL DILUTION   Alkaline Phosphatase 88 38 - 126 U/L   Total Bilirubin 1.6 (H) 0.3 - 1.2 mg/dL   GFR calc non Af Amer 30 (L) >60 mL/min   GFR calc Af Amer 35 (L) >60 mL/min   Anion gap 15 5 - 15    Comment: Performed at [facility], [place] [address] [place],  [address]  CBC with Differential/Platelet     Status: Abnormal  Collection Time: 04/23/19  2:08 AM  Result Value Ref Range   WBC 21.9 (H) 4.0 - 10.5 K/uL   RBC 4.98 4.22 - 5.81 MIL/uL   Hemoglobin 15.4 13.0 - 17.0 g/dL   HCT 45.2 39.0 - 52.0 %   MCV 90.8 80.0 - 100.0 fL   MCH 30.9 26.0 - 34.0 pg    MCHC 34.1 30.0 - 36.0 g/dL   RDW 12.2 11.5 - 15.5 %   Platelets 171 150 - 400 K/uL    Comment: REPEATED TO VERIFY   nRBC 0.0 0.0 - 0.2 %   Neutrophils Relative % 88 %   Neutro Abs 19.2 (H) 1.7 - 7.7 K/uL   Lymphocytes Relative 3 %   Lymphs Abs 0.8 0.7 - 4.0 K/uL   Monocytes Relative 8 %   Monocytes Absolute 1.6 (H) 0.1 - 1.0 K/uL   Eosinophils Relative 0 %   Eosinophils Absolute 0.0 0.0 - 0.5 K/uL   Basophils Relative 0 %   Basophils Absolute 0.0 0.0 - 0.1 K/uL   Immature Granulocytes 1 %   Abs Immature Granulocytes 0.25 (H) 0.00 - 0.07 K/uL    Comment: Performed at [facility], [address], [place], [place] [address]  Magnesium     Status: None   Collection Time: 04/23/19  2:08 AM  Result Value Ref Range   Magnesium 1.9 1.7 - 2.4 mg/dL    Comment: Performed at [facility] [address] [place], [place] [address]  Hemoglobin A1c     Status: Abnormal   Collection Time: 04/23/19  3:27 AM  Result Value Ref Range   Hgb A1c MFr Bld 6.5 (H) 4.8 - 5.6 %    Comment: (NOTE) Pre diabetes:          5.7%-6.4% Diabetes:              >6.4% Glycemic control for   <7.0% adults with diabetes    Mean Plasma Glucose 139.85 mg/dL    Comment: Performed at [facility] [address] [place],  [address]  Lipid panel     Status: Abnormal   Collection Time: 04/23/19  3:27 AM  Result Value Ref Range   Cholesterol 265 (H) 0 - 200 mg/dL   Triglycerides 191 (H) <150 mg/dL   HDL 24 (L) >40 mg/dL   Total CHOL/HDL Ratio 11.0 RATIO   VLDL 38 0 - 40 mg/dL   LDL Cholesterol 203 (H) 0 - 99 mg/dL    Comment:        Total Cholesterol/HDL:CHD Risk Coronary Heart Disease Risk Table                     Men   Women  1/2 Average Risk   3.4   3.3  Average Risk       5.0   4.4  2 X Average Risk   9.6   7.1  3 X Average Risk  23.4   11.0        Use the calculated Patient Ratio above and the CHD Risk Table to determine the patient's CHD Risk.        ATP III  CLASSIFICATION (LDL):  <100     mg/dL   Optimal  100-129  mg/dL   Near or Above                    Optimal  130-159  mg/dL   Borderline  160-189  mg/dL   High  >190     mg/dL   Very High Performed at [facility] [address] [place], [place] [address]   Glucose, capillary     Status: Abnormal   Collection Time: 04/23/19  3:37 AM  Result Value Ref Range   Glucose-Capillary 197 (H) 70 - 99 mg/dL  Potassium     Status: None   Collection Time: 04/23/19  6:08 AM  Result Value Ref Range   Potassium 5.0 3.5 - 5.1 mmol/L    Comment: Performed at [facility], [place] [address] [place], [place] [address]  Glucose, capillary     Status: Abnormal   Collection Time: 04/23/19  8:13 AM  Result Value Ref Range   Glucose-Capillary 179 (H) 70 - 99 mg/dL   Comment 1 Notify RN    Comment 2 Document in Chart   Na and K (sodium & potassium), rand urine     Status: None   Collection Time: 04/23/19  9:30 AM  Result Value Ref Range   Sodium, Ur 17 mmol/L   Potassium Urine 96 mmol/L    Comment: Performed at [facility] [address], [place], [place] [address]  Urinalysis, Routine w reflex microscopic     Status: Abnormal   Collection Time: 04/23/19  9:30 AM  Result Value Ref Range   Color, Urine AMBER (A) YELLOW    Comment: BIOCHEMICALS MAY BE AFFECTED BY COLOR   APPearance CLOUDY (A) CLEAR   Specific Gravity, Urine 1.025 1.005 - 1.030   pH 6.0 5.0 - 8.0   Glucose, UA 50 (A) NEGATIVE mg/dL   Hgb urine dipstick LARGE (A) NEGATIVE   Bilirubin Urine NEGATIVE NEGATIVE   Ketones, ur NEGATIVE NEGATIVE mg/dL   Protein, ur >=300 (A) NEGATIVE mg/dL   Nitrite NEGATIVE NEGATIVE   Leukocytes,Ua NEGATIVE NEGATIVE   RBC / HPF >50 (H) 0 - 5 RBC/hpf   WBC, UA 11-20 0 - 5 WBC/hpf   Bacteria, UA FEW (A) NONE SEEN    Comment: Performed at [facility], [place] [address] [place], [place] [address]  Lactic acid, plasma     Status: Abnormal   Collection Time: 04/23/19  9:45 AM    Result Value Ref Range   Lactic Acid, Venous 6.5 (HH) 0.5 - 1.9 mmol/L    Comment: CRITICAL RESULT CALLED TO, READ BACK BY AND VERIFIED WITH: T.BLACKBURN RN @ [phone] 04/23/2019 BY C.EDENS Performed at [facility], [place] [address] [place], [place] [address]   CBC with Differential/Platelet     Status: Abnormal   Collection Time: 04/23/19  9:45 AM  Result Value Ref Range   WBC 16.2 (H) 4.0 - 10.5 K/uL   RBC 4.75 4.22 - 5.81 MIL/uL   Hemoglobin 14.6 13.0 - 17.0 g/dL   HCT 44.0 39.0 - 52.0 %   MCV 92.6 80.0 - 100.0 fL   MCH 30.7 26.0 - 34.0 pg   MCHC 33.2 30.0 - 36.0 g/dL   RDW 12.2 11.5 - 15.5 %   Platelets 145 (L) 150 - 400 K/uL   nRBC 0.0 0.0 - 0.2 %   Neutrophils Relative % 83 %   Neutro Abs 13.5 (H) 1.7 - 7.7 K/uL   Lymphocytes Relative 11 %   Lymphs Abs 1.8 0.7 - 4.0 K/uL   Monocytes Relative 5 %   Monocytes Absolute 0.8 0.1 - 1.0 K/uL   Eosinophils Relative 0 %   Eosinophils Absolute 0.0 0.0 -  0.5 K/uL   Basophils Relative 0 %   Basophils Absolute 0.0 0.0 - 0.1 K/uL   Immature Granulocytes 1 %   Abs Immature Granulocytes 0.11 (H) 0.00 - 0.07 K/uL    Comment: Performed at [facility], [place] [address] [place], [place] [address]  I-STAT 7, (LYTES, BLD GAS, ICA, H+H)     Status: Abnormal   Collection Time: 04/23/19 10:22 AM  Result Value Ref Range   pH, Arterial 7.263 (L) 7.350 - 7.450   pCO2 arterial 32.5 32.0 - 48.0 mmHg   pO2, Arterial 74.0 (L) 83.0 - 108.0 mmHg   Bicarbonate 14.7 (L) 20.0 - 28.0 mmol/L   TCO2 16 (L) 22 - 32 mmol/L   O2 Saturation 93.0 %   Acid-base deficit 11.0 (H) 0.0 - 2.0 mmol/L   Sodium 140 135 - 145 mmol/L   Potassium 4.7 3.5 - 5.1 mmol/L   Calcium, Ion 1.08 (L) 1.15 - 1.40 mmol/L   HCT 41.0 39.0 - 52.0 %   Hemoglobin 13.9 13.0 - 17.0 g/dL   Patient temperature 98.6 F    Collection site RADIAL, ALLEN'S TEST ACCEPTABLE    Drawn by RT    Sample type ARTERIAL     ECG   Sinus tachycardia 106, IVCD with left bundle pattern,  nonspecific ST-T wave changes- Personally Reviewed  Telemetry   Sinus tachycardia- Personally Reviewed  Radiology   CT Code Stroke CTA Head W/WO contrast  Result Date: 04/29/2019 CLINICAL DATA:  35 year old male code stroke presentation. Suspicious left MCA sylvian fissure branch hyperdensity. History of cardiac issues with decreased cardiac ejection fraction. EXAM: CT ANGIOGRAPHY HEAD AND NECK TECHNIQUE: Multidetector CT imaging of the head and neck was performed using the standard protocol during bolus administration of intravenous contrast. Multiplanar CT image reconstructions and MIPs were obtained to evaluate the vascular anatomy. Carotid stenosis measurements (when applicable) are obtained utilizing NASCET criteria, using the distal internal carotid diameter as the denominator. CONTRAST:  78m OMNIPAQUE IOHEXOL 350 MG/ML SOLN COMPARISON:  Plain head CT 0402 hours today. FINDINGS: CTA NECK Skeleton: Lower cervical spine endplate degeneration. Mild reversal of lordosis. Upper chest: Negative upper lungs and mediastinum. Other neck: Negative. Aortic arch: Suboptimal but adequate contrast bolus. Three vessel arch configuration with no arch atherosclerosis. Right carotid system: Negative right CCA and right carotid bifurcation. Tortuous but otherwise negative cervical right ICA. Left carotid system: Negative left CCA and left carotid bifurcation. Negative cervical left ICA. Vertebral arteries: The proximal right subclavian artery and cervical right vertebral artery appear normal. The right vertebral is dominant. Normal proximal left subclavian artery. The non dominant left vertebral artery is diminutive but remains patent to the cisterna magna. CTA HEAD Posterior circulation: The non dominant left vertebral artery seems to functionally terminates in PICA and/or an anterior spinal artery. The right vertebral artery supplies the basilar. No right vertebral or basilar artery stenosis identified. But both  vessels are somewhat diminutive. Both posterior communicating arteries are present. Patent SCA origins. Overall fetal type PCAs. Bilateral PCA branches are grossly normal. Anterior circulation: Both ICA siphons are patent without plaque or stenosis. Normal posterior communicating artery origins. Patent carotid termini. Normal MCA and ACA origins. Bilateral ACA branches are within normal limits. Right MCA M1 and bifurcation are patent without stenosis. Right MCA branches are within normal limits. The left MCA M1 is patent and gives off an anterior temporal artery on series 9, image 118. Then at the expected location of the left MCA bifurcation or trifurcation the  vessel is occluded (series 11, image 20, series 5, image 57). There is reconstitution of the dominant posterior division branch. Venous sinuses: Early contrast timing, not evaluated. Anatomic variants: Dominant right vertebral artery, the left appears to terminates in PICA and/or the anterior spinal artery. Fetal type PCA origins. Review of the MIP images confirms the above findings IMPRESSION: 1. Positive for emergent large vessel occlusion at the Left MCA bifurcation. There is reconstitution of the dominant posterior division branch. This preliminary result was communicated to Dr. Leonel Ramsay at 0413 hours, and then we discussed by telephone. 2. Elsewhere negative CTA head and neck. Dominant right vertebral artery which supplies the basilar. Fetal type PCA origins. Electronically Signed   By: Genevie Ann M.D.   On: 04/24/2019 04:22   CT HEAD WO CONTRAST  Result Date: 04/23/2019 CLINICAL DATA:  Followup stroke.  Left MCA occlusion. EXAM: CT HEAD WITHOUT CONTRAST TECHNIQUE: Contiguous axial images were obtained from the base of the skull through the vertex without intravenous contrast. COMPARISON:  CT studies done yesterday. FINDINGS: Brain: No abnormality is seen affecting the brainstem or cerebellum. I think there is some artifactual low-density in the  posterior fossa. Cannot completely rule out cerebellar infarctions but think that would be unlikely given the previous studies. Right hemisphere remains normal. I think there is a small area gray-white differentiation loss in the left frontal operculum. No hemorrhage or swelling. Vascular: No abnormal vascular finding. Skull: Negative Sinuses/Orbits: Clear/normal Other: None IMPRESSION: Small region of loss of gray-white differentiation in the left frontal operculum consistent with a fairly small area of acute infarction compared to the previous perfusion abnormality. No hemorrhage or mass effect. Areas of low-density in the cerebellum that I favor are artifactual. There did not seem to be any abnormalities in that region on the previous examinations. Electronically Signed   By: Nelson Chimes M.D.   On: 04/23/2019 05:01   CT Code Stroke CTA Neck W/WO contrast  Result Date: 05/04/2019 CLINICAL DATA:  34 year old male code stroke presentation. Suspicious left MCA sylvian fissure branch hyperdensity. History of cardiac issues with decreased cardiac ejection fraction. EXAM: CT ANGIOGRAPHY HEAD AND NECK TECHNIQUE: Multidetector CT imaging of the head and neck was performed using the standard protocol during bolus administration of intravenous contrast. Multiplanar CT image reconstructions and MIPs were obtained to evaluate the vascular anatomy. Carotid stenosis measurements (when applicable) are obtained utilizing NASCET criteria, using the distal internal carotid diameter as the denominator. CONTRAST:  48m OMNIPAQUE IOHEXOL 350 MG/ML SOLN COMPARISON:  Plain head CT 0402 hours today. FINDINGS: CTA NECK Skeleton: Lower cervical spine endplate degeneration. Mild reversal of lordosis. Upper chest: Negative upper lungs and mediastinum. Other neck: Negative. Aortic arch: Suboptimal but adequate contrast bolus. Three vessel arch configuration with no arch atherosclerosis. Right carotid system: Negative right CCA and right  carotid bifurcation. Tortuous but otherwise negative cervical right ICA. Left carotid system: Negative left CCA and left carotid bifurcation. Negative cervical left ICA. Vertebral arteries: The proximal right subclavian artery and cervical right vertebral artery appear normal. The right vertebral is dominant. Normal proximal left subclavian artery. The non dominant left vertebral artery is diminutive but remains patent to the cisterna magna. CTA HEAD Posterior circulation: The non dominant left vertebral artery seems to functionally terminates in PICA and/or an anterior spinal artery. The right vertebral artery supplies the basilar. No right vertebral or basilar artery stenosis identified. But both vessels are somewhat diminutive. Both posterior communicating arteries are present. Patent SCA origins. Overall fetal type PCAs.  Bilateral PCA branches are grossly normal. Anterior circulation: Both ICA siphons are patent without plaque or stenosis. Normal posterior communicating artery origins. Patent carotid termini. Normal MCA and ACA origins. Bilateral ACA branches are within normal limits. Right MCA M1 and bifurcation are patent without stenosis. Right MCA branches are within normal limits. The left MCA M1 is patent and gives off an anterior temporal artery on series 9, image 118. Then at the expected location of the left MCA bifurcation or trifurcation the vessel is occluded (series 11, image 20, series 5, image 57). There is reconstitution of the dominant posterior division branch. Venous sinuses: Early contrast timing, not evaluated. Anatomic variants: Dominant right vertebral artery, the left appears to terminates in PICA and/or the anterior spinal artery. Fetal type PCA origins. Review of the MIP images confirms the above findings IMPRESSION: 1. Positive for emergent large vessel occlusion at the Left MCA bifurcation. There is reconstitution of the dominant posterior division branch. This preliminary result was  communicated to Dr. Leonel Ramsay at 0413 hours, and then we discussed by telephone. 2. Elsewhere negative CTA head and neck. Dominant right vertebral artery which supplies the basilar. Fetal type PCA origins. Electronically Signed   By: Genevie Ann M.D.   On: 05/14/2019 04:22   CT CEREBRAL PERFUSION W CONTRAST  Result Date: 05/07/2019 CLINICAL DATA:  34 year old male code stroke presentation with left MCA bifurcation occlusion on CTA. EXAM: CT PERFUSION BRAIN TECHNIQUE: Multiphase CT imaging of the brain was performed following IV bolus contrast injection. Subsequent parametric perfusion maps were calculated using RAPID software. CONTRAST:  44m OMNIPAQUE IOHEXOL 350 MG/ML SOLN COMPARISON:  CTA head and neck, plain CT earlier today. FINDINGS: CT Brain Perfusion Findings: CBF (<30%) Volume: 0 mL. There is a small 6-8 mL volume of less stringent and CBF <38% detected. Perfusion (Tmax>6.0s) volume: 60 mL. Hypoperfusion index 0.5 with 32 mL of T-max greater than 10s detected. Mismatch Volume: 60 mL ASPECTS on noncontrast CT Head: 10 at 0402 hours today. Infarction Location:Left frontal operculum, deep white matter. IMPRESSION: 1. CTP detects evidence of Left MCA territory oligemia concordant with the CTA findings. 2. No infarct core detected with standard CBF <30%. 3. 60 mL of penumbra detected with standard T-max > 6s. Electronically Signed   By: HGenevie AnnM.D.   On: 04/27/2019 05:06   DG Chest Port 1 View  Result Date: 04/23/2019 CLINICAL DATA:  Fever EXAM: PORTABLE CHEST 1 VIEW COMPARISON:  04/21/2019 FINDINGS: An endotracheal tube with tip 4 cm above the carina, NG tube entering the stomach with tip off the field of view and LEFT IJ central venous catheter with tip overlying the mid SVC again noted. Cardiomegaly again identified. Increasing patchy bilateral airspace opacities noted. No definite pleural effusion or pneumothorax. IMPRESSION: Increasing patchy bilateral airspace opacities which may represent edema or  infection. Electronically Signed   By: JMargarette CanadaM.D.   On: 04/23/2019 07:57   DG CHEST PORT 1 VIEW  Result Date: 05/02/2019 CLINICAL DATA:  Central line placement EXAM: PORTABLE CHEST 1 VIEW COMPARISON:  04/26/2019, 8:16 a.m. FINDINGS: Interval placement of like neck vascular catheter, tip positioned near the superior cavoatrial junction. Interval retraction of endotracheal tube, now positioned just above the thoracic inlet, approximately 6.5 cm above the carina. Interval placement of esophagogastric tube, tip and side port below the diaphragm. Unchanged cardiomegaly and mild, diffuse bilateral interstitial airspace opacity. IMPRESSION: 1. Interval placement of neck vascular catheter, tip positioned near the superior cavoatrial junction. 2. Interval retraction of endotracheal  tube, now positioned just above the thoracic inlet, approximately 6.5 cm above the carina. 3. Interval placement of esophagogastric tube, tip and side port below the diaphragm. 4. Stable cardiomegaly and diffuse bilateral interstitial airspace opacity, likely edema. Electronically Signed   By: Eddie Candle M.D.   On: 05/09/2019 19:58   DG Chest Port 1 View  Result Date: 05/07/2019 CLINICAL DATA:  Intubation. EXAM: PORTABLE CHEST 1 VIEW COMPARISON:  Chest x-ray 12/13/2018. FINDINGS: Endotracheal tube tip noted just above the carina. Proximal repositioning of 2-3 cm should be considered. Stable cardiomegaly. Diffuse bilateral pulmonary infiltrates/edema. Small left pleural effusion. No pneumothorax. IMPRESSION: 1. Endotracheal tube tip noted just above the carina. Proximal repositioning of 2-3 should be considered. 2. Cardiomegaly again noted. Diffuse bilateral pulmonary infiltrates/edema. Small left pleural effusion. These results will be called to the ordering clinician or representative by the Radiologist Assistant, and communication documented in the PACS or zVision Dashboard. Electronically Signed   By: Marcello Moores  Register   On:  04/27/2019 08:41   DG Abd Portable 1V  Result Date: 04/28/2019 CLINICAL DATA:  OG tube placement. EXAM: PORTABLE ABDOMEN - 1 VIEW COMPARISON:  None FINDINGS: OG tube tip is in the distal stomach. Bowel gas pattern is normal. Contrast seen in the renal collecting systems bilaterally. No bone abnormality. IMPRESSION: OG tube tip in the distal stomach. Electronically Signed   By: Lorriane Shire M.D.   On: 04/23/2019 10:07   IR PATIENT EVAL TECH 0-60 MINS  Result Date: 04/23/2019 Carlos Brewer     04/23/2019 10:29 AM 78f sheath pulled from right groin using manual pressure and quikclot hemostasis gauze at 0938 hrs.  Hemostasis achieved at 1003 hrs.  Groin reviewed with patient's RN JAnderson Malta  PT pulse on right side is dopplerable.  Unable to palpate or doppler DP pulse.  RN JAnderson Maltaaware of pedal pulse situation on right side.  Quikclot hemostasis gauze needs to be removed no later than 1000 hrs 04/24/2019.  ECHOCARDIOGRAM COMPLETE  Result Date: 05/05/2019   ECHOCARDIOGRAM REPORT   Patient Name:   LJustun Brewer Date of Exam: 05/11/2019 Medical Rec #:  [id number]                Height:       73.0 in Accession #:    [id number]               Weight:       273.8 lb Date of Birth:  [birth year]                BSA:          2.46 m Patient Age:    33 years                  BP:           121/95 mmHg Patient Gender: M                         HR:           87 bpm. Exam Location:  Inpatient Procedure: 2D Echo Indications:    stroke  History:        Patient has prior history of Echocardiogram examinations, most                 recent 07/13/2018. Chronic kidney disease; Risk Factors:Sleep  Apnea and substance abuse.  Sonographer:    Johny Chess Referring Phys: [phone] [place] KIRKPATRICK  Sonographer Comments: Echo performed with patient supine and on artificial respirator. IMPRESSIONS  1. Left ventricular ejection fraction, by visual estimation, is <20%. The left ventricle has severely  decreased function. There is no left ventricular hypertrophy.  2. Apex is not well-visualized. In setting of stroke with severe LV systolic dysfunction, recommend repeat TTE with contrast to rule out LV apical thrombus  3. Severely dilated left ventricular internal cavity size.  4. Left ventricular diastolic parameters are consistent with Grade III diastolic dysfunction (restrictive).  5. Elevated left atrial pressure.  6. The left ventricle demonstrates global hypokinesis.  7. Global right ventricle has normal systolic function.The right ventricular size is mildly enlarged.  8. Left atrial size was moderately dilated.  9. Right atrial size was normal. 10. The mitral valve is normal in structure. Mild mitral valve regurgitation. 11. The tricuspid valve is normal in structure. Tricuspid valve regurgitation is trivial. 12. The aortic valve is normal in structure. Aortic valve regurgitation is not visualized. No evidence of aortic valve sclerosis or stenosis. 13. The pulmonic valve was not well visualized. Pulmonic valve regurgitation is not visualized. FINDINGS  Left Ventricle: Left ventricular ejection fraction, by visual estimation, is <20%. The left ventricle has severely decreased function. The left ventricle demonstrates global hypokinesis. The left ventricular internal cavity size was severely dilated left ventricle. There is no left ventricular hypertrophy. Left ventricular diastolic parameters are consistent with Grade III diastolic dysfunction (restrictive). Elevated left atrial pressure. Right Ventricle: The right ventricular size is mildly enlarged. No increase in right ventricular wall thickness. Global RV systolic function is has normal systolic function. Left Atrium: Left atrial size was moderately dilated. Right Atrium: Right atrial size was normal in size Pericardium: There is no evidence of pericardial effusion. Mitral Valve: The mitral valve is normal in structure. Mild mitral valve regurgitation.  Tricuspid Valve: The tricuspid valve is normal in structure. Tricuspid valve regurgitation is trivial. Aortic Valve: The aortic valve is normal in structure. Aortic valve regurgitation is not visualized. The aortic valve is structurally normal, with no evidence of sclerosis or stenosis. Pulmonic Valve: The pulmonic valve was not well visualized. Pulmonic valve regurgitation is not visualized. Pulmonic regurgitation is not visualized. Aorta: The aortic root is normal in size and structure. Venous: IVC assessment for right atrial pressure unable to be performed due to mechanical ventilation. IAS/Shunts: No atrial level shunt detected by color flow Doppler.  LEFT VENTRICLE PLAX 2D LVIDd:         7.20 cm       Diastology LVIDs:         6.40 cm       LV e' lateral:   6.53 cm/s LV PW:         1.00 cm       LV E/e' lateral: 12.5 LV IVS:        0.80 cm       LV e' medial:    3.92 cm/s LVOT diam:     2.00 cm       LV E/e' medial:  20.8 LV SV:         64 ml LV SV Index:   24.74 LVOT Area:     3.14 cm  LV Volumes (MOD) LV area d, A4C:    56.30 cm LV area s, A4C:    49.80 cm LV major d, A4C:   10.20 cm LV major  s, A4C:   10.00 cm LV vol d, MOD A4C: 255.0 ml LV vol s, MOD A4C: 208.0 ml LV SV MOD A4C:     255.0 ml RIGHT VENTRICLE RV S prime:     11.20 cm/s TAPSE (M-mode): 1.8 cm LEFT ATRIUM              Index       RIGHT ATRIUM           Index LA diam:        4.30 cm  1.75 cm/m  RA Area:     22.40 cm LA Vol (A2C):   104.0 ml 42.29 ml/m RA Volume:   70.50 ml  28.67 ml/m LA Vol (A4C):   97.1 ml  39.49 ml/m LA Biplane Vol: 104.0 ml 42.29 ml/m  AORTIC VALVE LVOT Vmax:   47.00 cm/s LVOT Vmean:  34.500 cm/s LVOT VTI:    0.070 m  AORTA Ao Root diam: 3.00 cm MITRAL VALVE MV Area (PHT): 4.06 cm            SHUNTS MV PHT:        54.23 msec          Systemic VTI:  0.07 m MV Decel Time: 187 msec            Systemic Diam: 2.00 cm MR PISA:        0.57 cm MR PISA Radius: 0.30 cm MV E velocity: 81.40 cm/s 103 cm/s  Carlos Milian  MD Electronically signed by Carlos Milian MD Signature Date/Time: 04/23/2019/4:40:48 PM    Final    CT HEAD CODE STROKE WO CONTRAST  Result Date: 04/21/2019 CLINICAL DATA:  Code stroke.  35 year old male EXAM: CT HEAD WITHOUT CONTRAST TECHNIQUE: Contiguous axial images were obtained from the base of the skull through the vertex without intravenous contrast. COMPARISON:  Head CT 08/28/2018. FINDINGS: Brain: No midline shift, mass effect, or evidence of intracranial mass lesion. No ventriculomegaly. No acute intracranial hemorrhage identified. Evidence of chronic cortical encephalomalacia in the right frontal operculum (series 3, image 23). No superimposed acute cortically based infarct identified. Vascular: Suspicious asymmetric hyperdensity of a left MCA branch in the sylvian fissure on series 3, image 16. Skull: Stable, negative. Sinuses/Orbits: Visualized paranasal sinuses and mastoids are stable and well pneumatized. Other: No acute orbit or scalp soft tissue findings. ASPECTS [facility] Stroke Program Early CT Score) Total score (0-10 with 10 being normal): 10 (left MCA territory, chronic cortical encephalomalacia right frontal operculum). IMPRESSION: 1. Asymmetric hyperdensity of a Left MCA branch in the Sylvian fissure suspicious for emergent large vessel occlusion in the setting. 2. No associated acute cortically based infarct, ASPECTS 10. No acute hemorrhage. 3. Chronic right frontal operculum encephalomalacia. 4. These results were communicated to Dr. Leonel Ramsay at 4:06 am on 05/02/2019 by text page via the [facility] messaging system. Electronically Signed   By: Genevie Ann M.D.   On: 04/30/2019 04:10    Cardiac Studies   See echo above  Impression   1. Active Problems: 2.   Occlusion of left middle cerebral artery 3.   Middle cerebral artery embolism, left 4.   Cerebrovascular accident (CVA) due to thrombosis of left middle cerebral artery ([place]) 5.   Shock ([place]) 6.   Recommendation    1. Shock: Suspect his shock is primarily septic given fevers, leukocytosis (improving from 22K -> 16K today), elevated lactate and procalcitonin with abnormal urine and other possible sources of infection.  Coags are abnormal including elevated INR,  low fibrinogen, markedly elevated liver enzymes, suggesting possible DIC.  I agree with broad-spectrum antibiotics and further management per PCCM service.  Agree with switch from Neo-Synephrine to Levophed and vasopressin to maintain MAP of 65 as per neurology recommendations.  Will likely need diuresis as tolerated to keep CVP less than 10.  Check co-ox from central line and monitor daily.  2. Chronic systolic congestive heart failure/nonischemic cardiomyopathy: History of nonischemic cardiomyopathy with myocarditis in 2018 and persistently low EF less than 20%.  Currently off of heart failure medications due to hypotension, however digoxin has been continued.  His level was 0.4 yesterday. Creatinine yesterday was 2.64 -proceed cautiously if renal function worsens.  Nephrology has been consulted.  BNP is elevated 2909, previously was 1161 in September 2020.  Will need additional diuresis -appreciate nephrology recommendations but likely will need Lasix 60 to 80 mg IV twice daily based on urine output.  DC IV fluids.  Check co-oximetry panels daily. 3. Elevated LFTs: Greater than 11,000 AST/ALT.  Suspect shock liver versus hepatic congestion.  Avoid hepatotoxic agents.  Discontinue statin. 4. Dyslipidemia: Total cholesterol 265, HDL 24, LDL 203 and triglycerides 191, despite therapy on atorvastatin 40 mg daily at home.  Findings concerning for possible familial combined hyperlipidemia.  Unfortunately need to stop atorvastatin due to markedly elevated liver enzymes. 5. Left MCA stroke: Status post TPA and IR intervention-management per neurology.  CRITICAL CARE TIME: I have spent a total of 65 minutes with patient reviewing hospital notes, telemetry, EKGs,  labs and examining the patient as well as establishing an assessment and plan that was discussed with the patient. > 50% of time was spent in direct patient care. The patient is critically ill with multi-organ system failure and requires high complexity decision making for assessment and support, frequent evaluation and titration of therapies, application of advanced monitoring technologies and extensive interpretation of multiple databases.   Length of Stay:  LOS: 1 day   Carlos Casino, MD, [facility], [place] Director of the [facility] Diplomate of the American Board of Clinical Lipidology Attending Cardiologist  Direct Dial: [phone]  Fax: [phone]  Website:  [email or web address] 04/23/2019, 10:50 AM

## 2019-04-23 NOTE — Procedures (Signed)
88fr sheath pulled from right groin using manual pressure and quikclot hemostasis gauze at 0938 hrs.  Hemostasis achieved at 1003 hrs.  Groin reviewed with patient's RN Anderson Malta.  PT pulse on right side is dopplerable.  Unable to palpate or doppler DP pulse.  RN Anderson Malta aware of pedal pulse situation on right side.  Quikclot hemostasis gauze needs to be removed no later than 1000 hrs 04/24/2019.

## 2019-04-23 NOTE — Consult Note (Addendum)
Referring Provider: Dr. Duwayne Heck Primary Care Physician:  Patient, No Pcp Per Primary Gastroenterologist:  Althia Forts  Reason for Consultation:  Elevated LFTs  HPI: Carlos Brewer. is a 34 y.o. male with multiple medical problems as stated below admitted for an acute stroke and has a history of severe CHF and cocaine abuse. Septic shock on ventilator, pressors, and broad-spectrum antibiotics. He is seen for a consult due markedly elevated LFTs. Prior to admit his fiance does not think he was taking NSAIDs or Tylenol except on a rare basis. She reports that he was trying to cut down on his alcohol use but she is not sure how much he drank prior to admit. He received Tylenol on admit for fevers. S/P TPA on admit. No report of bleeding. AST > 10,000; ALT 10,132; TB 1.3, ALP 97, Cr 4.2. LFTs were initially normal yesterday. He is sedated and intubated. Fiance and mother in room with nurse.  Past Medical History:  Diagnosis Date  . CHF (congestive heart failure) ([place])   . Cocaine abuse ([place])   . HFrEF (heart failure with reduced ejection fraction) ([place])    a. 10/2016 Echo: EF 15-20%, Gr2 DD, mildly dil LA/RA.  Marland Kitchen History of cardiac cath    a. 10/2016 Cath: LM nl, LAD min irregs, LCX nl, RCA nl, EF 15%.  . Morbid obesity ([place])   . Myocarditis ([place])    a. 10/2016 Admit w/ CHF and trop elevation; b. 10/2016 Echo: EF 15-20%; c. 10/2016 Cath: Min irregs in LAD otw nl cors, EF 15%, glob HK.  Marland Kitchen NICM (nonischemic cardiomyopathy) ([place])    a. 10/2016 Echo: EF 15-20%, Gr2 DD; b. 10/2016 Cath: Nl cors.  . Palpitations   . Sleep apnea    USES CPAP  . Tobacco abuse     Past Surgical History:  Procedure Laterality Date  . CORONARY ANGIOPLASTY    . IR PATIENT EVAL TECH 0-60 MINS  04/23/2019  . NO PAST SURGERIES    . RIGHT/LEFT HEART CATH AND CORONARY ANGIOGRAPHY N/A 10/27/2016   Procedure: RIGHT/LEFT HEART CATH AND CORONARY ANGIOGRAPHY;  Surgeon: Wellington Hampshire, MD;  Location: [facility];   Service: Cardiovascular;  Laterality: N/A;  . WISDOM TOOTH EXTRACTION  2008    Prior to Admission medications   Medication Sig Start Date End Date Taking? Authorizing Provider  albuterol (ACCUNEB) 1.25 MG/3ML nebulizer solution Take 3 mLs by nebulization 4 (four) times daily as needed for wheezing or shortness of breath. 08/23/18  Yes [provider]  albuterol (VENTOLIN HFA) 108 (90 Base) MCG/ACT inhaler Inhale 1-2 puffs into the lungs every 6 (six) hours as needed for wheezing or shortness of breath. 07/17/18  Yes Florencia Reasons, MD  aspirin EC 81 MG EC tablet Take 1 tablet (81 mg total) by mouth daily. Patient taking differently: Take 81 mg by mouth at bedtime.  10/30/16  Yes Demetrios Loll, MD  atorvastatin (LIPITOR) 40 MG tablet Take 1 tablet (40 mg total) by mouth daily at 6 PM. 08/23/18  Yes Bensimhon, Shaune Pascal, MD  bacitracin ointment Apply 1 application topically 2 (two) times daily. Patient taking differently: Apply 1 application topically 2 (two) times daily as needed for wound care.  08/28/18  Yes Montine Circle, PA-C  carvedilol (COREG) 3.125 MG tablet Take 1 tablet (3.125 mg total) by mouth 2 (two) times daily with a meal. 08/23/18  Yes Bensimhon, Shaune Pascal, MD  digoxin (LANOXIN) 0.125 MG tablet Take 1 tablet (0.125 mg total) by mouth  daily. 08/23/18  Yes Bensimhon, Shaune Pascal, MD  FLUoxetine (PROZAC) 20 MG capsule Take 1 capsule (20 mg total) by mouth daily. 12/19/18  Yes Gherghe, Vella Redhead, MD  insulin aspart (NOVOLOG) 100 UNIT/ML injection Inject 10 Units into the skin 3 (three) times daily before meals.   Yes [provider]  insulin detemir (LEVEMIR) 100 UNIT/ML injection Inject 40 Units into the skin at bedtime.    Yes [provider]  metolazone (ZAROXOLYN) 2.5 MG tablet Take 1 tablet (2.5 mg total) by mouth daily as needed for up to 30 days. Take metolazone 2.89m x1 only for 5pound weight gain, please take potassium 236m x1 with metolazone. Patient taking differently: Take  2.5 mg by mouth daily as needed (for 5 lb weight gain (take additional potassium 20 meq with each dose).  07/17/18 06/15/19 Yes XuFlorencia ReasonsMD  Multiple Vitamin (MULTIVITAMIN WITH MINERALS) TABS tablet Take 1 tablet by mouth daily with lunch.   Yes [provider]  pantoprazole (PROTONIX) 40 MG tablet Take 40 mg by mouth daily as needed (acid reflux/indigestion).    Yes [provider]  potassium chloride SA (K-DUR) 20 MEQ tablet Take 1 tablet (20 mEq total) by mouth daily. Can take extra one if taking metolazone. Patient taking differently: Take 20 mEq by mouth See admin instructions. Take one tablet (20 meq) by mouth every morning, also take an extra tablet (20 meq) with each dose of metolazone 08/23/18  Yes Bensimhon, DaShaune PascalMD  sacubitril-valsartan (ENTRESTO) 24-26 MG Take 1 tablet by mouth 2 (two) times daily. 08/23/18  Yes Bensimhon, DaShaune PascalMD  spironolactone (ALDACTONE) 25 MG tablet Take 1 tablet (25 mg total) by mouth daily. Patient taking differently: Take 25 mg by mouth at bedtime.  08/23/18  Yes Bensimhon, DaShaune PascalMD  torsemide (DEMADEX) 20 MG tablet Take 2 tablets (40 mg total) by mouth 2 (two) times daily. Patient taking differently: Take 40 mg by mouth daily.  08/23/18  Yes Bensimhon, DaShaune PascalMD  Vitamin D, Ergocalciferol, (DRISDOL) 1.25 MG (50000 UNIT) CAPS capsule Take 50,000 Units by mouth every Thursday.   Yes [provider]    Scheduled Meds: . sodium chloride   Intravenous Once  . chlorhexidine gluconate (MEDLINE KIT)  15 mL Mouth Rinse BID  . Chlorhexidine Gluconate Cloth  6 each Topical Q0600  . digoxin  0.125 mg Per Tube Daily  . fentaNYL (SUBLIMAZE) injection  50 mcg Intravenous Once  . FLUoxetine  20 mg Per Tube Daily  . hydrocortisone sod succinate (SOLU-CORTEF) inj  50 mg Intravenous Q6H  . insulin aspart  0-15 Units Subcutaneous Q4H  . mouth rinse  15 mL Mouth Rinse 10 times per day  . mupirocin ointment  1 application Nasal BID  .  pantoprazole (PROTONIX) IV  40 mg Intravenous Daily  . sodium bicarbonate  100 mEq Intravenous Q12H  . sodium chloride flush  10-40 mL Intracatheter Q12H   Continuous Infusions: . sodium chloride    . sodium chloride 500 mL (04/23/19 1321)  . chlorothiazide (DIURIL) IV    . chlorothiazide (DIURIL) IV    . dexmedetomidine (PRECEDEX) IV infusion 1.2 mcg/kg/hr (04/23/19 0545)  . epinephrine 1 mcg/min (04/23/19 1215)  . fentaNYL infusion INTRAVENOUS 50 mcg/hr (04/23/19 0836)  . furosemide    . midazolam    . niCARDipine    . norepinephrine (LEVOPHED) Adult infusion 22 mcg/min (04/23/19 0745)  . piperacillin-tazobactam (ZOSYN)  IV 3.375 g (04/23/19 1323)  . vancomycin 1,250 mg (  04/23/19 1219)  . vasopressin (PITRESSIN) infusion - *FOR SHOCK* 0.03 Units/min (04/23/19 1043)   PRN Meds:.sodium chloride, bisacodyl, docusate, fentaNYL, labetalol **AND** niCARDipine, midazolam, sodium chloride flush  Allergies as of 05/12/2019  . (No Known Allergies)    Family History  Problem Relation Age of Onset  . Healthy Mother   . Heart failure Father        EF is 35%    Social History   Socioeconomic History  . Marital status: Married    Spouse name: Not on file  . Number of children: Not on file  . Years of education: Not on file  . Highest education level: Not on file  Occupational History  . Occupation: switches out Electronics engineer: [employer]  Tobacco Use  . Smoking status: Current Every Day Smoker    Packs/day: 0.50    Types: Cigarettes  . Smokeless tobacco: Never Used  Substance and Sexual Activity  . Alcohol use: Yes  . Drug use: Not Currently    Types: Cocaine    Comment: quit 10/23/2016   . Sexual activity: Yes    Birth control/protection: None  Other Topics Concern  . Not on file  Social History Narrative   ** Merged History Encounter **       ** Merged History Encounter **   Lives in [place] w/ girlfriend.  He does not routinely exercise.    Social Determinants of Health   Financial Resource Strain:   . Difficulty of Paying Living Expenses: Not on file  Food Insecurity:   . Worried About Charity fundraiser in the Last Year: Not on file  . Ran Out of Food in the Last Year: Not on file  Transportation Needs:   . Lack of Transportation (Medical): Not on file  . Lack of Transportation (Non-Medical): Not on file  Physical Activity:   . Days of Exercise per Week: Not on file  . Minutes of Exercise per Session: Not on file  Stress:   . Feeling of Stress : Not on file  Social Connections:   . Frequency of Communication with Friends and Family: Not on file  . Frequency of Social Gatherings with Friends and Family: Not on file  . Attends Religious Services: Not on file  . Active Member of Clubs or Organizations: Not on file  . Attends Archivist Meetings: Not on file  . Marital Status: Not on file  Intimate Partner Violence:   . Fear of Current or Ex-Partner: Not on file  . Emotionally Abused: Not on file  . Physically Abused: Not on file  . Sexually Abused: Not on file    Review of Systems: All negative except as stated above in HPI.  Physical Exam: Vital signs: Vitals:   04/23/19 1300 04/23/19 1315  BP: (!) 95/50 (!) 95/43  Pulse: (!) 104 (!) 105  Resp: (!) 30 (!) 30  Temp: (!) 101.3 F (38.5 C) (!) 94.5 F (34.7 C)  SpO2: 98% 99%   Last BM Date: (pta) General:  Sedated, intubated, obese Head: normocephalic, atraumatic Eyes: anicteric sclera ENT: oropharynx clear Neck: supple, nontender Lungs:  Clear throughout to auscultation.   No wheezes, crackles, or rhonchi. No acute distress. Heart:  Regular rate and rhythm; no murmurs, clicks, rubs,  or gallops. Abdomen: soft, nontender, nondistended, +BS, obese Rectal:  Deferred Ext: no edema  GI:  Lab Results: Recent Labs    04/19/2019 0355 04/25/2019 0430 04/23/19 0208 04/23/19 0208 04/23/19 0945 04/23/19  1022 04/23/19 1237  WBC 7.2  --   21.9*  --  16.2*  --   --   HGB 14.9   < > 15.4   < > 14.6 13.9 13.9  HCT 42.8   < > 45.2   < > 44.0 41.0 41.0  PLT 243  --  171  --  145*  --   --    < > = values in this interval not displayed.   BMET Recent Labs    05/09/2019 0355 05/10/2019 0355 05/03/2019 0430 04/29/2019 0905 04/23/19 0002 04/23/19 0608 04/23/19 0945 04/23/19 0945 04/23/19 1022 04/23/19 1122 04/23/19 1237  NA 136   < > 136   < > 139  --  140  --  140  --  139  K 3.4*   < > 3.4*   < > 5.0   < > 4.6   < > 4.7 5.0 5.0  CL 98   < > 102  --  109  --  109  --   --   --   --   CO2 24  --   --   --  15*  --  13*  --   --   --   --   GLUCOSE 153*   < > 149*  --  220*  --  229*  --   --   --   --   BUN 17   < > 17  --  23*  --  28*  --   --   --   --   CREATININE 1.17   < > 1.10  --  2.64*  --  4.21*  --   --   --   --   CALCIUM 9.5  --   --   --  7.7*  --  7.5*  --   --   --   --    < > = values in this interval not displayed.   LFT Recent Labs    04/23/19 0945  PROT 5.6*  ALBUMIN 2.8*  AST >10,000*  ALT 10,132*  ALKPHOS 97  BILITOT 1.3*   PT/INR Recent Labs    05/11/2019 0355 04/23/19 0945  LABPROT 12.6 31.7*  INR 1.0 3.1*     Studies/Results: CT Code Stroke CTA Head W/WO contrast  Result Date: 05/12/2019 CLINICAL DATA:  34 year old male code stroke presentation. Suspicious left MCA sylvian fissure branch hyperdensity. History of cardiac issues with decreased cardiac ejection fraction. EXAM: CT ANGIOGRAPHY HEAD AND NECK TECHNIQUE: Multidetector CT imaging of the head and neck was performed using the standard protocol during bolus administration of intravenous contrast. Multiplanar CT image reconstructions and MIPs were obtained to evaluate the vascular anatomy. Carotid stenosis measurements (when applicable) are obtained utilizing NASCET criteria, using the distal internal carotid diameter as the denominator. CONTRAST:  40m OMNIPAQUE IOHEXOL 350 MG/ML SOLN COMPARISON:  Plain head CT 0402 hours today.  FINDINGS: CTA NECK Skeleton: Lower cervical spine endplate degeneration. Mild reversal of lordosis. Upper chest: Negative upper lungs and mediastinum. Other neck: Negative. Aortic arch: Suboptimal but adequate contrast bolus. Three vessel arch configuration with no arch atherosclerosis. Right carotid system: Negative right CCA and right carotid bifurcation. Tortuous but otherwise negative cervical right ICA. Left carotid system: Negative left CCA and left carotid bifurcation. Negative cervical left ICA. Vertebral arteries: The proximal right subclavian artery and cervical right vertebral artery appear normal. The right vertebral is dominant. Normal proximal left subclavian artery. The non dominant  left vertebral artery is diminutive but remains patent to the cisterna magna. CTA HEAD Posterior circulation: The non dominant left vertebral artery seems to functionally terminates in PICA and/or an anterior spinal artery. The right vertebral artery supplies the basilar. No right vertebral or basilar artery stenosis identified. But both vessels are somewhat diminutive. Both posterior communicating arteries are present. Patent SCA origins. Overall fetal type PCAs. Bilateral PCA branches are grossly normal. Anterior circulation: Both ICA siphons are patent without plaque or stenosis. Normal posterior communicating artery origins. Patent carotid termini. Normal MCA and ACA origins. Bilateral ACA branches are within normal limits. Right MCA M1 and bifurcation are patent without stenosis. Right MCA branches are within normal limits. The left MCA M1 is patent and gives off an anterior temporal artery on series 9, image 118. Then at the expected location of the left MCA bifurcation or trifurcation the vessel is occluded (series 11, image 20, series 5, image 57). There is reconstitution of the dominant posterior division branch. Venous sinuses: Early contrast timing, not evaluated. Anatomic variants: Dominant right vertebral  artery, the left appears to terminates in PICA and/or the anterior spinal artery. Fetal type PCA origins. Review of the MIP images confirms the above findings IMPRESSION: 1. Positive for emergent large vessel occlusion at the Left MCA bifurcation. There is reconstitution of the dominant posterior division branch. This preliminary result was communicated to Dr. Leonel Ramsay at 0413 hours, and then we discussed by telephone. 2. Elsewhere negative CTA head and neck. Dominant right vertebral artery which supplies the basilar. Fetal type PCA origins. Electronically Signed   By: Genevie Ann M.D.   On: 05/11/2019 04:22   CT HEAD WO CONTRAST  Result Date: 04/23/2019 CLINICAL DATA:  Followup stroke.  Left MCA occlusion. EXAM: CT HEAD WITHOUT CONTRAST TECHNIQUE: Contiguous axial images were obtained from the base of the skull through the vertex without intravenous contrast. COMPARISON:  CT studies done yesterday. FINDINGS: Brain: No abnormality is seen affecting the brainstem or cerebellum. I think there is some artifactual low-density in the posterior fossa. Cannot completely rule out cerebellar infarctions but think that would be unlikely given the previous studies. Right hemisphere remains normal. I think there is a small area gray-white differentiation loss in the left frontal operculum. No hemorrhage or swelling. Vascular: No abnormal vascular finding. Skull: Negative Sinuses/Orbits: Clear/normal Other: None IMPRESSION: Small region of loss of gray-white differentiation in the left frontal operculum consistent with a fairly small area of acute infarction compared to the previous perfusion abnormality. No hemorrhage or mass effect. Areas of low-density in the cerebellum that I favor are artifactual. There did not seem to be any abnormalities in that region on the previous examinations. Electronically Signed   By: Nelson Chimes M.D.   On: 04/23/2019 05:01   CT Code Stroke CTA Neck W/WO contrast  Result Date:  05/07/2019 CLINICAL DATA:  34 year old male code stroke presentation. Suspicious left MCA sylvian fissure branch hyperdensity. History of cardiac issues with decreased cardiac ejection fraction. EXAM: CT ANGIOGRAPHY HEAD AND NECK TECHNIQUE: Multidetector CT imaging of the head and neck was performed using the standard protocol during bolus administration of intravenous contrast. Multiplanar CT image reconstructions and MIPs were obtained to evaluate the vascular anatomy. Carotid stenosis measurements (when applicable) are obtained utilizing NASCET criteria, using the distal internal carotid diameter as the denominator. CONTRAST:  67m OMNIPAQUE IOHEXOL 350 MG/ML SOLN COMPARISON:  Plain head CT 0402 hours today. FINDINGS: CTA NECK Skeleton: Lower cervical spine endplate degeneration. Mild reversal of  lordosis. Upper chest: Negative upper lungs and mediastinum. Other neck: Negative. Aortic arch: Suboptimal but adequate contrast bolus. Three vessel arch configuration with no arch atherosclerosis. Right carotid system: Negative right CCA and right carotid bifurcation. Tortuous but otherwise negative cervical right ICA. Left carotid system: Negative left CCA and left carotid bifurcation. Negative cervical left ICA. Vertebral arteries: The proximal right subclavian artery and cervical right vertebral artery appear normal. The right vertebral is dominant. Normal proximal left subclavian artery. The non dominant left vertebral artery is diminutive but remains patent to the cisterna magna. CTA HEAD Posterior circulation: The non dominant left vertebral artery seems to functionally terminates in PICA and/or an anterior spinal artery. The right vertebral artery supplies the basilar. No right vertebral or basilar artery stenosis identified. But both vessels are somewhat diminutive. Both posterior communicating arteries are present. Patent SCA origins. Overall fetal type PCAs. Bilateral PCA branches are grossly normal. Anterior  circulation: Both ICA siphons are patent without plaque or stenosis. Normal posterior communicating artery origins. Patent carotid termini. Normal MCA and ACA origins. Bilateral ACA branches are within normal limits. Right MCA M1 and bifurcation are patent without stenosis. Right MCA branches are within normal limits. The left MCA M1 is patent and gives off an anterior temporal artery on series 9, image 118. Then at the expected location of the left MCA bifurcation or trifurcation the vessel is occluded (series 11, image 20, series 5, image 57). There is reconstitution of the dominant posterior division branch. Venous sinuses: Early contrast timing, not evaluated. Anatomic variants: Dominant right vertebral artery, the left appears to terminates in PICA and/or the anterior spinal artery. Fetal type PCA origins. Review of the MIP images confirms the above findings IMPRESSION: 1. Positive for emergent large vessel occlusion at the Left MCA bifurcation. There is reconstitution of the dominant posterior division branch. This preliminary result was communicated to Dr. Leonel Ramsay at 0413 hours, and then we discussed by telephone. 2. Elsewhere negative CTA head and neck. Dominant right vertebral artery which supplies the basilar. Fetal type PCA origins. Electronically Signed   By: Genevie Ann M.D.   On: 05/06/2019 04:22   CT CEREBRAL PERFUSION W CONTRAST  Result Date: 05/01/2019 CLINICAL DATA:  34 year old male code stroke presentation with left MCA bifurcation occlusion on CTA. EXAM: CT PERFUSION BRAIN TECHNIQUE: Multiphase CT imaging of the brain was performed following IV bolus contrast injection. Subsequent parametric perfusion maps were calculated using RAPID software. CONTRAST:  72m OMNIPAQUE IOHEXOL 350 MG/ML SOLN COMPARISON:  CTA head and neck, plain CT earlier today. FINDINGS: CT Brain Perfusion Findings: CBF (<30%) Volume: 0 mL. There is a small 6-8 mL volume of less stringent and CBF <38% detected. Perfusion  (Tmax>6.0s) volume: 60 mL. Hypoperfusion index 0.5 with 32 mL of T-max greater than 10s detected. Mismatch Volume: 60 mL ASPECTS on noncontrast CT Head: 10 at 0402 hours today. Infarction Location:Left frontal operculum, deep white matter. IMPRESSION: 1. CTP detects evidence of Left MCA territory oligemia concordant with the CTA findings. 2. No infarct core detected with standard CBF <30%. 3. 60 mL of penumbra detected with standard T-max > 6s. Electronically Signed   By: HGenevie AnnM.D.   On: 04/21/2019 05:06   DG Chest Port 1 View  Result Date: 04/23/2019 CLINICAL DATA:  Fever EXAM: PORTABLE CHEST 1 VIEW COMPARISON:  04/27/2019 FINDINGS: An endotracheal tube with tip 4 cm above the carina, NG tube entering the stomach with tip off the field of view and LEFT IJ central venous  catheter with tip overlying the mid SVC again noted. Cardiomegaly again identified. Increasing patchy bilateral airspace opacities noted. No definite pleural effusion or pneumothorax. IMPRESSION: Increasing patchy bilateral airspace opacities which may represent edema or infection. Electronically Signed   By: Margarette Canada M.D.   On: 04/23/2019 07:57   DG CHEST PORT 1 VIEW  Result Date: 04/30/2019 CLINICAL DATA:  Central line placement EXAM: PORTABLE CHEST 1 VIEW COMPARISON:  04/21/2019, 8:16 a.m. FINDINGS: Interval placement of like neck vascular catheter, tip positioned near the superior cavoatrial junction. Interval retraction of endotracheal tube, now positioned just above the thoracic inlet, approximately 6.5 cm above the carina. Interval placement of esophagogastric tube, tip and side port below the diaphragm. Unchanged cardiomegaly and mild, diffuse bilateral interstitial airspace opacity. IMPRESSION: 1. Interval placement of neck vascular catheter, tip positioned near the superior cavoatrial junction. 2. Interval retraction of endotracheal tube, now positioned just above the thoracic inlet, approximately 6.5 cm above the carina. 3.  Interval placement of esophagogastric tube, tip and side port below the diaphragm. 4. Stable cardiomegaly and diffuse bilateral interstitial airspace opacity, likely edema. Electronically Signed   By: Eddie Candle M.D.   On: 05/05/2019 19:58   DG Chest Port 1 View  Result Date: 04/25/2019 CLINICAL DATA:  Intubation. EXAM: PORTABLE CHEST 1 VIEW COMPARISON:  Chest x-ray 12/13/2018. FINDINGS: Endotracheal tube tip noted just above the carina. Proximal repositioning of 2-3 cm should be considered. Stable cardiomegaly. Diffuse bilateral pulmonary infiltrates/edema. Small left pleural effusion. No pneumothorax. IMPRESSION: 1. Endotracheal tube tip noted just above the carina. Proximal repositioning of 2-3 should be considered. 2. Cardiomegaly again noted. Diffuse bilateral pulmonary infiltrates/edema. Small left pleural effusion. These results will be called to the ordering clinician or representative by the Radiologist Assistant, and communication documented in the PACS or zVision Dashboard. Electronically Signed   By: Marcello Moores  Register   On: 05/01/2019 08:41   DG Abd Portable 1V  Result Date: 05/10/2019 CLINICAL DATA:  OG tube placement. EXAM: PORTABLE ABDOMEN - 1 VIEW COMPARISON:  None FINDINGS: OG tube tip is in the distal stomach. Bowel gas pattern is normal. Contrast seen in the renal collecting systems bilaterally. No bone abnormality. IMPRESSION: OG tube tip in the distal stomach. Electronically Signed   By: Lorriane Shire M.D.   On: 05/05/2019 10:07   IR PATIENT EVAL TECH 0-60 MINS  Result Date: 04/23/2019 Darnelle Spangle     04/23/2019 10:29 AM 55f sheath pulled from right groin using manual pressure and quikclot hemostasis gauze at 0938 hrs.  Hemostasis achieved at 1003 hrs.  Groin reviewed with patient's RN JAnderson Malta  PT pulse on right side is dopplerable.  Unable to palpate or doppler DP pulse.  RN JAnderson Maltaaware of pedal pulse situation on right side.  Quikclot hemostasis gauze needs to be removed no  later than 1000 hrs 04/24/2019.  ECHOCARDIOGRAM COMPLETE  Result Date: 04/23/2019   ECHOCARDIOGRAM REPORT   Patient Name:   LQuirino Kakos Date of Exam: 04/19/2019 Medical Rec #:  [id number]                Height:       73.0 in Accession #:    [id number]               Weight:       273.8 lb Date of Birth:  [birth year]                BSA:  2.46 m Patient Age:    33 years                  BP:           121/95 mmHg Patient Gender: M                         HR:           87 bpm. Exam Location:  Inpatient Procedure: 2D Echo Indications:    stroke  History:        Patient has prior history of Echocardiogram examinations, most                 recent 07/13/2018. Chronic kidney disease; Risk Factors:Sleep                 Apnea and substance abuse.  Sonographer:    Johny Chess Referring Phys: [phone] [place] KIRKPATRICK  Sonographer Comments: Echo performed with patient supine and on artificial respirator. IMPRESSIONS  1. Left ventricular ejection fraction, by visual estimation, is <20%. The left ventricle has severely decreased function. There is no left ventricular hypertrophy.  2. Apex is not well-visualized. In setting of stroke with severe LV systolic dysfunction, recommend repeat TTE with contrast to rule out LV apical thrombus  3. Severely dilated left ventricular internal cavity size.  4. Left ventricular diastolic parameters are consistent with Grade III diastolic dysfunction (restrictive).  5. Elevated left atrial pressure.  6. The left ventricle demonstrates global hypokinesis.  7. Global right ventricle has normal systolic function.The right ventricular size is mildly enlarged.  8. Left atrial size was moderately dilated.  9. Right atrial size was normal. 10. The mitral valve is normal in structure. Mild mitral valve regurgitation. 11. The tricuspid valve is normal in structure. Tricuspid valve regurgitation is trivial. 12. The aortic valve is normal in structure. Aortic valve  regurgitation is not visualized. No evidence of aortic valve sclerosis or stenosis. 13. The pulmonic valve was not well visualized. Pulmonic valve regurgitation is not visualized. FINDINGS  Left Ventricle: Left ventricular ejection fraction, by visual estimation, is <20%. The left ventricle has severely decreased function. The left ventricle demonstrates global hypokinesis. The left ventricular internal cavity size was severely dilated left ventricle. There is no left ventricular hypertrophy. Left ventricular diastolic parameters are consistent with Grade III diastolic dysfunction (restrictive). Elevated left atrial pressure. Right Ventricle: The right ventricular size is mildly enlarged. No increase in right ventricular wall thickness. Global RV systolic function is has normal systolic function. Left Atrium: Left atrial size was moderately dilated. Right Atrium: Right atrial size was normal in size Pericardium: There is no evidence of pericardial effusion. Mitral Valve: The mitral valve is normal in structure. Mild mitral valve regurgitation. Tricuspid Valve: The tricuspid valve is normal in structure. Tricuspid valve regurgitation is trivial. Aortic Valve: The aortic valve is normal in structure. Aortic valve regurgitation is not visualized. The aortic valve is structurally normal, with no evidence of sclerosis or stenosis. Pulmonic Valve: The pulmonic valve was not well visualized. Pulmonic valve regurgitation is not visualized. Pulmonic regurgitation is not visualized. Aorta: The aortic root is normal in size and structure. Venous: IVC assessment for right atrial pressure unable to be performed due to mechanical ventilation. IAS/Shunts: No atrial level shunt detected by color flow Doppler.  LEFT VENTRICLE PLAX 2D LVIDd:         7.20 cm       Diastology LVIDs:  6.40 cm       LV e' lateral:   6.53 cm/s LV PW:         1.00 cm       LV E/e' lateral: 12.5 LV IVS:        0.80 cm       LV e' medial:    3.92 cm/s  LVOT diam:     2.00 cm       LV E/e' medial:  20.8 LV SV:         64 ml LV SV Index:   24.74 LVOT Area:     3.14 cm  LV Volumes (MOD) LV area d, A4C:    56.30 cm LV area s, A4C:    49.80 cm LV major d, A4C:   10.20 cm LV major s, A4C:   10.00 cm LV vol d, MOD A4C: 255.0 ml LV vol s, MOD A4C: 208.0 ml LV SV MOD A4C:     255.0 ml RIGHT VENTRICLE RV S prime:     11.20 cm/s TAPSE (M-mode): 1.8 cm LEFT ATRIUM              Index       RIGHT ATRIUM           Index LA diam:        4.30 cm  1.75 cm/m  RA Area:     22.40 cm LA Vol (A2C):   104.0 ml 42.29 ml/m RA Volume:   70.50 ml  28.67 ml/m LA Vol (A4C):   97.1 ml  39.49 ml/m LA Biplane Vol: 104.0 ml 42.29 ml/m  AORTIC VALVE LVOT Vmax:   47.00 cm/s LVOT Vmean:  34.500 cm/s LVOT VTI:    0.070 m  AORTA Ao Root diam: 3.00 cm MITRAL VALVE MV Area (PHT): 4.06 cm            SHUNTS MV PHT:        54.23 msec          Systemic VTI:  0.07 m MV Decel Time: 187 msec            Systemic Diam: 2.00 cm MR PISA:        0.57 cm MR PISA Radius: 0.30 cm MV E velocity: 81.40 cm/s 103 cm/s  Oswaldo Milian MD Electronically signed by Oswaldo Milian MD Signature Date/Time: 05/13/2019/4:40:48 PM    Final    CT HEAD CODE STROKE WO CONTRAST  Result Date: 05/02/2019 CLINICAL DATA:  Code stroke.  34 year old male EXAM: CT HEAD WITHOUT CONTRAST TECHNIQUE: Contiguous axial images were obtained from the base of the skull through the vertex without intravenous contrast. COMPARISON:  Head CT 08/28/2018. FINDINGS: Brain: No midline shift, mass effect, or evidence of intracranial mass lesion. No ventriculomegaly. No acute intracranial hemorrhage identified. Evidence of chronic cortical encephalomalacia in the right frontal operculum (series 3, image 23). No superimposed acute cortically based infarct identified. Vascular: Suspicious asymmetric hyperdensity of a left MCA branch in the sylvian fissure on series 3, image 16. Skull: Stable, negative. Sinuses/Orbits: Visualized paranasal  sinuses and mastoids are stable and well pneumatized. Other: No acute orbit or scalp soft tissue findings. ASPECTS [facility] Stroke Program Early CT Score) Total score (0-10 with 10 being normal): 10 (left MCA territory, chronic cortical encephalomalacia right frontal operculum). IMPRESSION: 1. Asymmetric hyperdensity of a Left MCA branch in the Sylvian fissure suspicious for emergent large vessel occlusion in the setting. 2. No associated acute cortically based infarct, ASPECTS 10. No acute  hemorrhage. 3. Chronic right frontal operculum encephalomalacia. 4. These results were communicated to Dr. Leonel Ramsay at 4:06 am on 05/15/2019 by text page via the [facility] messaging system. Electronically Signed   By: Genevie Ann M.D.   On: 04/21/2019 04:10    Impression/Plan: Marked transaminitis in the setting of shock, CVA, and acute kidney injury with Cr. 4.2. Elevated LFTs likely multifactorial due to shock liver but also could be due to rhabdomyolysis and CK has been ordered and is pending. Doubt recent Tylenol causing this elevation unless he was taking it prior to admit with alcohol. Doubt primary liver failure but suspect due to passive congestion with CHF and shock liver as source as well as possible rhabdomyolysis. Check Lipase. Agree with [facility] for elevated INR. Abd U/S and dopplers pending. Continue aggressive management and supportive care. Will follow.    LOS: 1 day   [name]  04/23/2019, 1:45 PM  Questions please call [phone]

## 2019-04-23 NOTE — Progress Notes (Signed)
Starting CRRT.  Metabolic acidosis, hyperkalemia, and hypoxemia.  Septic shock - CVP stable around 10, SCVO2 79%.  NSTEMI: trop down trending.  Likely 2/2 demand in setting of shock.

## 2019-04-23 NOTE — Significant Event (Signed)
Nephrology follow up.  No response with IV diuretics.  Patient is becoming more acidotic and hyperkalemic.  Discussed with ICU team, plan to start CRRT.  Family agreed.  Dialysis with 4K bath, UF goal 100 to 150 cc an hour night if tolerated by BP, heparin in dialysis circuit only.  The dialysis catheter will be placed by ICU team.    Discussed with Dr. Patsey Berthold.

## 2019-04-23 NOTE — Progress Notes (Addendum)
RT obtained post vent change ABG on pt with the following results. Elink MD Stretch notified of critical pH of 7.123. Increase rate to 32 to assist w/correction of metabolic acidosis. RT will continue to monitor.    Results for Carlos Brewer, Carlos Brewer (MRN [id number]) as of 04/23/2019 23:17  Ref. Range 04/23/2019 22:49  Sample type Unknown ARTERIAL  pH, Arterial Latest Ref Range: 7.350 - 7.450  7.123 (LL)  pCO2 arterial Latest Ref Range: 32.0 - 48.0 mmHg 41.5  pO2, Arterial Latest Ref Range: 83.0 - 108.0 mmHg 133.0 (H)  TCO2 Latest Ref Range: 22 - 32 mmol/L 15 (L)  Acid-base deficit Latest Ref Range: 0.0 - 2.0 mmol/L 15.0 (H)  Bicarbonate Latest Ref Range: 20.0 - 28.0 mmol/L 13.5 (L)  O2 Saturation Latest Units: % 98.0  Patient temperature Unknown 99.1 F  Collection site Unknown ARTERIAL LINE

## 2019-04-24 DIAGNOSIS — I5022 Chronic systolic (congestive) heart failure: Secondary | ICD-10-CM | POA: Diagnosis not present

## 2019-04-24 DIAGNOSIS — J988 Other specified respiratory disorders: Secondary | ICD-10-CM | POA: Diagnosis not present

## 2019-04-24 DIAGNOSIS — R579 Shock, unspecified: Secondary | ICD-10-CM | POA: Diagnosis not present

## 2019-04-24 DIAGNOSIS — I63312 Cerebral infarction due to thrombosis of left middle cerebral artery: Secondary | ICD-10-CM | POA: Diagnosis not present

## 2019-04-24 DIAGNOSIS — I6602 Occlusion and stenosis of left middle cerebral artery: Secondary | ICD-10-CM | POA: Diagnosis not present

## 2019-04-24 DIAGNOSIS — K7200 Acute and subacute hepatic failure without coma: Secondary | ICD-10-CM | POA: Diagnosis not present

## 2019-04-24 DIAGNOSIS — I5043 Acute on chronic combined systolic (congestive) and diastolic (congestive) heart failure: Secondary | ICD-10-CM | POA: Diagnosis not present

## 2019-04-24 LAB — COMPREHENSIVE METABOLIC PANEL
ALT: 7495 U/L — ABNORMAL HIGH (ref 0–44)
AST: >10000 U/L — ABNORMAL HIGH (ref 15–41)
Albumin: 2.9 g/dL — ABNORMAL LOW (ref 3.5–5.0)
Alkaline Phosphatase: 101 U/L (ref 38–126)
Anion gap: 14 (ref 5–15)
BUN: 28 mg/dL — ABNORMAL HIGH (ref 6–20)
CO2: 27 mmol/L (ref 22–32)
Calcium: 7.1 mg/dL — ABNORMAL LOW (ref 8.9–10.3)
Chloride: 98 mmol/L (ref 98–111)
Creatinine, Ser: 4.77 mg/dL — ABNORMAL HIGH (ref 0.61–1.24)
GFR calc Af Amer: 17 mL/min — ABNORMAL LOW (ref >60)
GFR calc non Af Amer: 15 mL/min — ABNORMAL LOW (ref >60)
Glucose, Bld: 206 mg/dL — ABNORMAL HIGH (ref 70–99)
Potassium: 5.3 mmol/L — ABNORMAL HIGH (ref 3.5–5.1)
Sodium: 139 mmol/L (ref 135–145)
Total Bilirubin: 2.6 mg/dL — ABNORMAL HIGH (ref 0.3–1.2)
Total Protein: 6.1 g/dL — ABNORMAL LOW (ref 6.5–8.1)

## 2019-04-24 LAB — POCT I-STAT 7, (LYTES, BLD GAS, ICA,H+H)
Acid-Base Excess: 3 mmol/L — ABNORMAL HIGH (ref 0.0–2.0)
Acid-base deficit: 3 mmol/L — ABNORMAL HIGH (ref 0.0–2.0)
Bicarbonate: 25 mmol/L (ref 20.0–28.0)
Bicarbonate: 28.7 mmol/L — ABNORMAL HIGH (ref 20.0–28.0)
Bicarbonate: 29.1 mmol/L — ABNORMAL HIGH (ref 20.0–28.0)
Calcium, Ion: 0.95 mmol/L — ABNORMAL LOW (ref 1.15–1.40)
Calcium, Ion: 0.93 mmol/L — ABNORMAL LOW (ref 1.15–1.40)
Calcium, Ion: 0.95 mmol/L — ABNORMAL LOW (ref 1.15–1.40)
HCT: 40 % (ref 39.0–52.0)
HCT: 40 % (ref 39.0–52.0)
HCT: 37 % — ABNORMAL LOW (ref 39.0–52.0)
Hemoglobin: 13.6 g/dL (ref 13.0–17.0)
Hemoglobin: 13.6 g/dL (ref 13.0–17.0)
Hemoglobin: 12.6 g/dL — ABNORMAL LOW (ref 13.0–17.0)
O2 Saturation: 97 %
O2 Saturation: 98 %
O2 Saturation: 95 %
Patient temperature: 36.2
Patient temperature: 98.6
Patient temperature: 37
Potassium: 5.3 mmol/L — ABNORMAL HIGH (ref 3.5–5.1)
Potassium: 5.3 mmol/L — ABNORMAL HIGH (ref 3.5–5.1)
Potassium: 4 mmol/L (ref 3.5–5.1)
Sodium: 136 mmol/L (ref 135–145)
Sodium: 137 mmol/L (ref 135–145)
Sodium: 137 mmol/L (ref 135–145)
TCO2: 27 mmol/L (ref 22–32)
TCO2: 31 mmol/L (ref 22–32)
TCO2: 31 mmol/L (ref 22–32)
pCO2 arterial: 55 mmHg — ABNORMAL HIGH (ref 32.0–48.0)
pCO2 arterial: 61.8 mmHg — ABNORMAL HIGH (ref 32.0–48.0)
pCO2 arterial: 52.4 mmHg — ABNORMAL HIGH (ref 32.0–48.0)
pH, Arterial: 7.261 — ABNORMAL LOW (ref 7.350–7.450)
pH, Arterial: 7.275 — ABNORMAL LOW (ref 7.350–7.450)
pH, Arterial: 7.353 (ref 7.350–7.450)
pO2, Arterial: 100 mmHg (ref 83.0–108.0)
pO2, Arterial: 114 mmHg — ABNORMAL HIGH (ref 83.0–108.0)
pO2, Arterial: 84 mmHg (ref 83.0–108.0)

## 2019-04-24 LAB — CBC WITH DIFFERENTIAL/PLATELET
Abs Immature Granulocytes: 0.11 10*3/uL — ABNORMAL HIGH (ref 0.00–0.07)
Basophils Absolute: 0 10*3/uL (ref 0.0–0.1)
Basophils Relative: 0 %
Eosinophils Absolute: 0.4 10*3/uL (ref 0.0–0.5)
Eosinophils Relative: 2 %
HCT: 39.8 % (ref 39.0–52.0)
Hemoglobin: 13.7 g/dL (ref 13.0–17.0)
Immature Granulocytes: 1 %
Lymphocytes Relative: 4 %
Lymphs Abs: 0.8 10*3/uL (ref 0.7–4.0)
MCH: 31.8 pg (ref 26.0–34.0)
MCHC: 34.4 g/dL (ref 30.0–36.0)
MCV: 92.3 fL (ref 80.0–100.0)
Monocytes Absolute: 0.5 10*3/uL (ref 0.1–1.0)
Monocytes Relative: 3 %
Neutro Abs: 15.8 10*3/uL — ABNORMAL HIGH (ref 1.7–7.7)
Neutrophils Relative %: 90 %
Platelets: 137 10*3/uL — ABNORMAL LOW (ref 150–400)
RBC: 4.31 MIL/uL (ref 4.22–5.81)
RDW: 12.3 % (ref 11.5–15.5)
WBC: 17.6 10*3/uL — ABNORMAL HIGH (ref 4.0–10.5)
nRBC: 0 % (ref 0.0–0.2)

## 2019-04-24 LAB — COOXEMETRY PANEL
Carboxyhemoglobin: 0.8 % (ref 0.5–1.5)
Methemoglobin: 0.7 % (ref 0.0–1.5)
O2 Saturation: 83 %
Total hemoglobin: 13.8 g/dL (ref 12.0–16.0)

## 2019-04-24 LAB — RENAL FUNCTION PANEL
Albumin: 3 g/dL — ABNORMAL LOW (ref 3.5–5.0)
Albumin: 2.8 g/dL — ABNORMAL LOW (ref 3.5–5.0)
Anion gap: 20 — ABNORMAL HIGH (ref 5–15)
Anion gap: 15 (ref 5–15)
BUN: 29 mg/dL — ABNORMAL HIGH (ref 6–20)
BUN: 28 mg/dL — ABNORMAL HIGH (ref 6–20)
CO2: 20 mmol/L — ABNORMAL LOW (ref 22–32)
CO2: 26 mmol/L (ref 22–32)
Calcium: 7.1 mg/dL — ABNORMAL LOW (ref 8.9–10.3)
Calcium: 7.3 mg/dL — ABNORMAL LOW (ref 8.9–10.3)
Chloride: 98 mmol/L (ref 98–111)
Chloride: 98 mmol/L (ref 98–111)
Creatinine, Ser: 5.17 mg/dL — ABNORMAL HIGH (ref 0.61–1.24)
Creatinine, Ser: 4.32 mg/dL — ABNORMAL HIGH (ref 0.61–1.24)
GFR calc Af Amer: 16 mL/min — ABNORMAL LOW (ref >60)
GFR calc Af Amer: 19 mL/min — ABNORMAL LOW (ref >60)
GFR calc non Af Amer: 13 mL/min — ABNORMAL LOW (ref >60)
GFR calc non Af Amer: 17 mL/min — ABNORMAL LOW (ref >60)
Glucose, Bld: 276 mg/dL — ABNORMAL HIGH (ref 70–99)
Glucose, Bld: 159 mg/dL — ABNORMAL HIGH (ref 70–99)
Phosphorus: 4.9 mg/dL — ABNORMAL HIGH (ref 2.5–4.6)
Phosphorus: 4.5 mg/dL (ref 2.5–4.6)
Potassium: 5.7 mmol/L — ABNORMAL HIGH (ref 3.5–5.1)
Potassium: 4.3 mmol/L (ref 3.5–5.1)
Sodium: 138 mmol/L (ref 135–145)
Sodium: 139 mmol/L (ref 135–145)

## 2019-04-24 LAB — BPAM FFP
Blood Product Expiration Date: 202102112359
Blood Product Expiration Date: 202102112359
ISSUE DATE / TIME: 202102061445
ISSUE DATE / TIME: 202102061445
Unit Type and Rh: 1700
Unit Type and Rh: 7300

## 2019-04-24 LAB — PREPARE FRESH FROZEN PLASMA
Unit division: 0
Unit division: 0

## 2019-04-24 LAB — POTASSIUM
Potassium: 5.5 mmol/L — ABNORMAL HIGH (ref 3.5–5.1)
Potassium: 5.4 mmol/L — ABNORMAL HIGH (ref 3.5–5.1)
Potassium: 5.5 mmol/L — ABNORMAL HIGH (ref 3.5–5.1)
Potassium: 4.6 mmol/L (ref 3.5–5.1)

## 2019-04-24 LAB — PROCALCITONIN: Procalcitonin: 26.87 ng/mL

## 2019-04-24 LAB — VANCOMYCIN, RANDOM: Vancomycin Rm: 13

## 2019-04-24 LAB — FIBRINOGEN: Fibrinogen: 306 mg/dL (ref 210–475)

## 2019-04-24 LAB — CALCIUM, IONIZED: Calcium, Ionized, Serum: 3.4 mg/dL — ABNORMAL LOW (ref 4.5–5.6)

## 2019-04-24 LAB — PROTIME-INR
INR: 1.8 — ABNORMAL HIGH (ref 0.8–1.2)
Prothrombin Time: 20.3 seconds — ABNORMAL HIGH (ref 11.4–15.2)

## 2019-04-24 LAB — LACTIC ACID, PLASMA
Lactic Acid, Venous: 4.3 mmol/L (ref 0.5–1.9)
Lactic Acid, Venous: 3.5 mmol/L (ref 0.5–1.9)

## 2019-04-24 LAB — GLUCOSE, CAPILLARY
Glucose-Capillary: 233 mg/dL — ABNORMAL HIGH (ref 70–99)
Glucose-Capillary: 213 mg/dL — ABNORMAL HIGH (ref 70–99)
Glucose-Capillary: 177 mg/dL — ABNORMAL HIGH (ref 70–99)
Glucose-Capillary: 158 mg/dL — ABNORMAL HIGH (ref 70–99)
Glucose-Capillary: 152 mg/dL — ABNORMAL HIGH (ref 70–99)
Glucose-Capillary: 180 mg/dL — ABNORMAL HIGH (ref 70–99)

## 2019-04-24 LAB — MAGNESIUM
Magnesium: 1.9 mg/dL (ref 1.7–2.4)
Magnesium: 1.9 mg/dL (ref 1.7–2.4)

## 2019-04-24 MED ORDER — CALCIUM GLUCONATE-NACL 1-0.675 GM/50ML-% IV SOLN
1.0000 g | Freq: Once | INTRAVENOUS | Status: AC
  Administered 2019-04-24: 18:00:00 1000 mg via INTRAVENOUS
  Filled 2019-04-24: qty 50

## 2019-04-24 MED ORDER — PRISMASOL BGK 4/2.5 32-4-2.5 MEQ/L REPLACEMENT SOLN
Status: DC
  Filled 2019-04-24 (×26): qty 5000

## 2019-04-24 MED ORDER — PRISMASOL BGK 0/2.5 32-2.5 MEQ/L REPLACEMENT SOLN
Status: DC
  Filled 2019-04-24 (×5): qty 5000

## 2019-04-24 MED ORDER — INSULIN ASPART 100 UNIT/ML ~~LOC~~ SOLN
0.0000 [IU] | SUBCUTANEOUS | Status: DC
  Administered 2019-04-24 – 2019-04-25 (×5): 4 [IU] via SUBCUTANEOUS
  Administered 2019-04-25: 7 [IU] via SUBCUTANEOUS
  Administered 2019-04-25 (×4): 4 [IU] via SUBCUTANEOUS
  Administered 2019-04-26 (×2): 7 [IU] via SUBCUTANEOUS
  Administered 2019-04-26: 4 [IU] via SUBCUTANEOUS
  Administered 2019-04-26 (×2): 7 [IU] via SUBCUTANEOUS
  Administered 2019-04-27: 4 [IU] via SUBCUTANEOUS
  Administered 2019-04-27: 7 [IU] via SUBCUTANEOUS
  Administered 2019-04-27: 11 [IU] via SUBCUTANEOUS
  Administered 2019-04-27: 4 [IU] via SUBCUTANEOUS
  Administered 2019-04-27 (×2): 7 [IU] via SUBCUTANEOUS
  Administered 2019-04-27: 11 [IU] via SUBCUTANEOUS
  Administered 2019-04-28 – 2019-04-29 (×6): 4 [IU] via SUBCUTANEOUS
  Administered 2019-04-29: 7 [IU] via SUBCUTANEOUS
  Administered 2019-04-29 – 2019-04-30 (×5): 4 [IU] via SUBCUTANEOUS
  Administered 2019-04-30: 7 [IU] via SUBCUTANEOUS
  Administered 2019-04-30 (×4): 4 [IU] via SUBCUTANEOUS
  Administered 2019-05-01: 3 [IU] via SUBCUTANEOUS
  Administered 2019-05-01 (×5): 4 [IU] via SUBCUTANEOUS
  Administered 2019-05-01 – 2019-05-02 (×3): 3 [IU] via SUBCUTANEOUS
  Administered 2019-05-02 (×2): 4 [IU] via SUBCUTANEOUS
  Administered 2019-05-02: 3 [IU] via SUBCUTANEOUS
  Administered 2019-05-02: 4 [IU] via SUBCUTANEOUS
  Administered 2019-05-03: 3 [IU] via SUBCUTANEOUS
  Administered 2019-05-03: 4 [IU] via SUBCUTANEOUS
  Administered 2019-05-03: 3 [IU] via SUBCUTANEOUS
  Administered 2019-05-03: 4 [IU] via SUBCUTANEOUS
  Administered 2019-05-03: 3 [IU] via SUBCUTANEOUS
  Administered 2019-05-04: 4 [IU] via SUBCUTANEOUS
  Administered 2019-05-04: 11 [IU] via SUBCUTANEOUS
  Administered 2019-05-04: 3 [IU] via SUBCUTANEOUS
  Administered 2019-05-05 (×2): 4 [IU] via SUBCUTANEOUS
  Administered 2019-05-05: 3 [IU] via SUBCUTANEOUS
  Administered 2019-05-05: 7 [IU] via SUBCUTANEOUS
  Administered 2019-05-06: 3 [IU] via SUBCUTANEOUS
  Administered 2019-05-06 (×4): 4 [IU] via SUBCUTANEOUS
  Administered 2019-05-06: 7 [IU] via SUBCUTANEOUS
  Administered 2019-05-07 (×2): 11 [IU] via SUBCUTANEOUS
  Administered 2019-05-07: 15 [IU] via SUBCUTANEOUS
  Administered 2019-05-07 (×2): 11 [IU] via SUBCUTANEOUS
  Administered 2019-05-07: 15 [IU] via SUBCUTANEOUS
  Administered 2019-05-08 (×2): 11 [IU] via SUBCUTANEOUS
  Administered 2019-05-08: 7 [IU] via SUBCUTANEOUS
  Administered 2019-05-08: 20 [IU] via SUBCUTANEOUS
  Administered 2019-05-08: 4 [IU] via SUBCUTANEOUS
  Administered 2019-05-08: 7 [IU] via SUBCUTANEOUS
  Administered 2019-05-09: 11 [IU] via SUBCUTANEOUS
  Administered 2019-05-09 (×3): 7 [IU] via SUBCUTANEOUS
  Administered 2019-05-09: 15 [IU] via SUBCUTANEOUS
  Administered 2019-05-10: 7 [IU] via SUBCUTANEOUS
  Administered 2019-05-10: 4 [IU] via SUBCUTANEOUS
  Administered 2019-05-10: 7 [IU] via SUBCUTANEOUS
  Administered 2019-05-10: 15 [IU] via SUBCUTANEOUS
  Administered 2019-05-10: 7 [IU] via SUBCUTANEOUS
  Administered 2019-05-10 (×2): 11 [IU] via SUBCUTANEOUS
  Administered 2019-05-11 (×2): 7 [IU] via SUBCUTANEOUS
  Administered 2019-05-11: 4 [IU] via SUBCUTANEOUS
  Administered 2019-05-11: 7 [IU] via SUBCUTANEOUS
  Administered 2019-05-11: 11 [IU] via SUBCUTANEOUS
  Administered 2019-05-12: 4 [IU] via SUBCUTANEOUS
  Administered 2019-05-12: 7 [IU] via SUBCUTANEOUS
  Administered 2019-05-12: 4 [IU] via SUBCUTANEOUS
  Administered 2019-05-12 (×2): 11 [IU] via SUBCUTANEOUS
  Administered 2019-05-12: 15 [IU] via SUBCUTANEOUS
  Administered 2019-05-13: 4 [IU] via SUBCUTANEOUS
  Administered 2019-05-13: 3 [IU] via SUBCUTANEOUS
  Administered 2019-05-13: 11 [IU] via SUBCUTANEOUS
  Administered 2019-05-13 (×2): 4 [IU] via SUBCUTANEOUS
  Administered 2019-05-13: 7 [IU] via SUBCUTANEOUS
  Administered 2019-05-13 – 2019-05-15 (×10): 4 [IU] via SUBCUTANEOUS
  Administered 2019-05-15: 7 [IU] via SUBCUTANEOUS
  Administered 2019-05-15: 4 [IU] via SUBCUTANEOUS
  Administered 2019-05-15: 3 [IU] via SUBCUTANEOUS
  Administered 2019-05-16 (×5): 4 [IU] via SUBCUTANEOUS
  Administered 2019-05-17: 7 [IU] via SUBCUTANEOUS
  Administered 2019-05-17 (×4): 3 [IU] via SUBCUTANEOUS
  Administered 2019-05-18 (×3): 4 [IU] via SUBCUTANEOUS
  Administered 2019-05-18 (×2): 3 [IU] via SUBCUTANEOUS
  Administered 2019-05-18 – 2019-05-19 (×2): 4 [IU] via SUBCUTANEOUS
  Administered 2019-05-19: 7 [IU] via SUBCUTANEOUS
  Administered 2019-05-19: 4 [IU] via SUBCUTANEOUS
  Administered 2019-05-19 (×3): 3 [IU] via SUBCUTANEOUS
  Administered 2019-05-20 (×2): 4 [IU] via SUBCUTANEOUS
  Administered 2019-05-20: 3 [IU] via SUBCUTANEOUS
  Administered 2019-05-20: 4 [IU] via SUBCUTANEOUS
  Administered 2019-05-20 – 2019-05-21 (×3): 7 [IU] via SUBCUTANEOUS
  Administered 2019-05-21: 4 [IU] via SUBCUTANEOUS
  Administered 2019-05-21 (×2): 7 [IU] via SUBCUTANEOUS
  Administered 2019-05-21: 11 [IU] via SUBCUTANEOUS
  Administered 2019-05-21: 4 [IU] via SUBCUTANEOUS
  Administered 2019-05-22 (×2): 7 [IU] via SUBCUTANEOUS
  Administered 2019-05-22 – 2019-05-23 (×4): 4 [IU] via SUBCUTANEOUS
  Administered 2019-05-23: 7 [IU] via SUBCUTANEOUS
  Administered 2019-05-23: 4 [IU] via SUBCUTANEOUS
  Administered 2019-05-23: 3 [IU] via SUBCUTANEOUS
  Administered 2019-05-23 (×2): 4 [IU] via SUBCUTANEOUS
  Administered 2019-05-23: 3 [IU] via SUBCUTANEOUS
  Administered 2019-05-24 (×2): 4 [IU] via SUBCUTANEOUS
  Administered 2019-05-24 (×2): 7 [IU] via SUBCUTANEOUS
  Administered 2019-05-24: 3 [IU] via SUBCUTANEOUS
  Administered 2019-05-25: 4 [IU] via SUBCUTANEOUS
  Administered 2019-05-25 (×2): 7 [IU] via SUBCUTANEOUS
  Administered 2019-05-25 – 2019-05-26 (×4): 4 [IU] via SUBCUTANEOUS
  Administered 2019-05-26: 11 [IU] via SUBCUTANEOUS
  Administered 2019-05-26 (×2): 7 [IU] via SUBCUTANEOUS
  Administered 2019-05-26 (×2): 4 [IU] via SUBCUTANEOUS
  Administered 2019-05-27: 11 [IU] via SUBCUTANEOUS
  Administered 2019-05-27: 4 [IU] via SUBCUTANEOUS
  Administered 2019-05-27: 7 [IU] via SUBCUTANEOUS
  Administered 2019-05-27: 15 [IU] via SUBCUTANEOUS
  Administered 2019-05-27: 4 [IU] via SUBCUTANEOUS
  Administered 2019-05-27 (×2): 11 [IU] via SUBCUTANEOUS
  Administered 2019-05-28: 15 [IU] via SUBCUTANEOUS
  Administered 2019-05-28 (×3): 7 [IU] via SUBCUTANEOUS
  Administered 2019-05-28 – 2019-05-29 (×2): 11 [IU] via SUBCUTANEOUS
  Administered 2019-05-29: 4 [IU] via SUBCUTANEOUS
  Administered 2019-05-29: 11 [IU] via SUBCUTANEOUS
  Administered 2019-05-29 (×2): 7 [IU] via SUBCUTANEOUS
  Administered 2019-05-29: 11 [IU] via SUBCUTANEOUS
  Administered 2019-05-29 – 2019-05-30 (×2): 4 [IU] via SUBCUTANEOUS
  Administered 2019-05-30: 7 [IU] via SUBCUTANEOUS
  Administered 2019-05-30: 4 [IU] via SUBCUTANEOUS
  Administered 2019-05-30: 7 [IU] via SUBCUTANEOUS
  Administered 2019-05-30: 11 [IU] via SUBCUTANEOUS
  Administered 2019-05-30: 7 [IU] via SUBCUTANEOUS
  Administered 2019-05-31: 11 [IU] via SUBCUTANEOUS
  Administered 2019-05-31: 7 [IU] via SUBCUTANEOUS
  Administered 2019-05-31 (×2): 11 [IU] via SUBCUTANEOUS
  Administered 2019-05-31: 20 [IU] via SUBCUTANEOUS
  Administered 2019-05-31: 3 [IU] via SUBCUTANEOUS
  Administered 2019-06-01: 4 [IU] via SUBCUTANEOUS
  Administered 2019-06-01: 11 [IU] via SUBCUTANEOUS
  Administered 2019-06-01: 15 [IU] via SUBCUTANEOUS
  Administered 2019-06-01: 4 [IU] via SUBCUTANEOUS
  Administered 2019-06-01: 3 [IU] via SUBCUTANEOUS
  Administered 2019-06-01: 4 [IU] via SUBCUTANEOUS
  Administered 2019-06-02 – 2019-06-03 (×2): 3 [IU] via SUBCUTANEOUS
  Administered 2019-06-04 (×2): 4 [IU] via SUBCUTANEOUS
  Administered 2019-06-04 – 2019-06-08 (×10): 3 [IU] via SUBCUTANEOUS
  Administered 2019-06-08 (×2): 4 [IU] via SUBCUTANEOUS
  Administered 2019-06-08: 7 [IU] via SUBCUTANEOUS
  Administered 2019-06-08: 4 [IU] via SUBCUTANEOUS
  Administered 2019-06-08: 7 [IU] via SUBCUTANEOUS
  Administered 2019-06-08 – 2019-06-09 (×3): 4 [IU] via SUBCUTANEOUS
  Administered 2019-06-09 (×2): 3 [IU] via SUBCUTANEOUS
  Administered 2019-06-09 – 2019-06-10 (×2): 4 [IU] via SUBCUTANEOUS
  Administered 2019-06-10 (×2): 3 [IU] via SUBCUTANEOUS
  Administered 2019-06-10: 4 [IU] via SUBCUTANEOUS
  Administered 2019-06-11: 3 [IU] via SUBCUTANEOUS
  Administered 2019-06-11 – 2019-06-14 (×6): 4 [IU] via SUBCUTANEOUS
  Administered 2019-06-14: 3 [IU] via SUBCUTANEOUS
  Administered 2019-06-14: 7 [IU] via SUBCUTANEOUS
  Administered 2019-06-14: 3 [IU] via SUBCUTANEOUS
  Administered 2019-06-14 (×2): 4 [IU] via SUBCUTANEOUS
  Administered 2019-06-15: 7 [IU] via SUBCUTANEOUS
  Administered 2019-06-15: 4 [IU] via SUBCUTANEOUS
  Administered 2019-06-15 – 2019-06-16 (×3): 7 [IU] via SUBCUTANEOUS
  Administered 2019-06-16 (×2): 4 [IU] via SUBCUTANEOUS
  Administered 2019-06-16 – 2019-06-17 (×4): 3 [IU] via SUBCUTANEOUS
  Administered 2019-06-17 (×2): 4 [IU] via SUBCUTANEOUS
  Administered 2019-06-18 (×2): 3 [IU] via SUBCUTANEOUS
  Administered 2019-06-18: 16:00:00 4 [IU] via SUBCUTANEOUS
  Administered 2019-06-19 – 2019-06-20 (×3): 3 [IU] via SUBCUTANEOUS
  Administered 2019-06-20 – 2019-06-21 (×4): 4 [IU] via SUBCUTANEOUS
  Administered 2019-06-21 – 2019-06-25 (×11): 3 [IU] via SUBCUTANEOUS
  Administered 2019-06-26: 4 [IU] via SUBCUTANEOUS
  Administered 2019-06-26: 3 [IU] via SUBCUTANEOUS
  Administered 2019-06-26 (×4): 4 [IU] via SUBCUTANEOUS
  Administered 2019-06-27 – 2019-06-30 (×4): 3 [IU] via SUBCUTANEOUS
  Administered 2019-07-02 (×3): 4 [IU] via SUBCUTANEOUS
  Administered 2019-07-02: 16:00:00 3 [IU] via SUBCUTANEOUS
  Administered 2019-07-03: 4 [IU] via SUBCUTANEOUS
  Administered 2019-07-03: 3 [IU] via SUBCUTANEOUS
  Administered 2019-07-03: 4 [IU] via SUBCUTANEOUS
  Administered 2019-07-03: 3 [IU] via SUBCUTANEOUS
  Administered 2019-07-04: 4 [IU] via SUBCUTANEOUS
  Administered 2019-07-04: 12:00:00 3 [IU] via SUBCUTANEOUS
  Administered 2019-07-04: 4 [IU] via SUBCUTANEOUS
  Administered 2019-07-04: 7 [IU] via SUBCUTANEOUS
  Administered 2019-07-04 (×2): 4 [IU] via SUBCUTANEOUS
  Administered 2019-07-04: 7 [IU] via SUBCUTANEOUS
  Administered 2019-07-05 (×3): 4 [IU] via SUBCUTANEOUS
  Administered 2019-07-05: 3 [IU] via SUBCUTANEOUS
  Administered 2019-07-06: 4 [IU] via SUBCUTANEOUS
  Administered 2019-07-06: 3 [IU] via SUBCUTANEOUS
  Administered 2019-07-06: 4 [IU] via SUBCUTANEOUS
  Administered 2019-07-06 (×2): 3 [IU] via SUBCUTANEOUS
  Administered 2019-07-06 – 2019-07-08 (×6): 4 [IU] via SUBCUTANEOUS
  Administered 2019-07-08: 7 [IU] via SUBCUTANEOUS
  Administered 2019-07-08 (×3): 4 [IU] via SUBCUTANEOUS
  Administered 2019-07-08: 12:00:00 3 [IU] via SUBCUTANEOUS
  Administered 2019-07-09: 15 [IU] via SUBCUTANEOUS
  Administered 2019-07-09: 11 [IU] via SUBCUTANEOUS
  Administered 2019-07-09 (×2): 4 [IU] via SUBCUTANEOUS

## 2019-04-24 NOTE — Progress Notes (Signed)
DAILY PROGRESS NOTE   Patient Name: Carlos Brewer. Date of Encounter: 04/24/2019 Cardiologist: Ida Rogue, MD  Chief Complaint   Intubated, sedated on vent  Patient Profile   34 yo male with history of NICM and myocarditis, followed by the advanced CHF service, presented with acute MCA stroke treated with tPA and IR thrombectomy, now hypotensive, concerning for possible septic versus cardiogenic shock  Subjective   No changes overnight. Progressive metabolic acidosis. Vent changed. Co-OX 83 this am. Leukocytosis slightly worse, liver enyzmes pending (likely still very high), INR 1.8. Platelets down to 137. BP stable on levophed and vasopressin - MAP 82 today, may be able to wean down.  Objective   Vitals:   04/24/19 1000 04/24/19 1015 04/24/19 1030 04/24/19 1045  BP: 104/65  (!) 100/59   Pulse: 80 81 81 76  Resp: (!) 23 (!) 23 (!) 25 (!) 32  Temp: 97.9 F (36.6 C) 97.9 F (36.6 C) 98.1 F (36.7 C)   TempSrc:      SpO2: 100% 100% 100% 100%  Weight:      Height:        Intake/Output Summary (Last 24 hours) at 04/24/2019 1059 Last data filed at 04/24/2019 1017 Gross per 24 hour  Intake 3724.03 ml  Output 3527 ml  Net 197.03 ml   Filed Weights   04/26/2019 0737 04/24/19 0500  Weight: 124.2 kg 133 kg    Physical Exam   General appearance: intubated, sedated on vent Neck: no carotid bruit, no JVD, thyroid not enlarged, symmetric, no tenderness/mass/nodules and left IJ line, on CRRT Lungs: clear to auscultation bilaterally Heart: regular rate and rhythm Abdomen: soft, non-tender; bowel sounds normal; no masses,  no organomegaly and obese Extremities: extremities normal, atraumatic, no cyanosis or edema Pulses: 2+ and symmetric Skin: Skin color, texture, turgor normal. No rashes or lesions Neurologic: Mental status: intubated, sedated Psych: Cannot assess  Inpatient Medications    Scheduled Meds:  sodium chloride   Intravenous Once   chlorhexidine  gluconate (MEDLINE KIT)  15 mL Mouth Rinse BID   Chlorhexidine Gluconate Cloth  6 each Topical Q0600   fentaNYL (SUBLIMAZE) injection  50 mcg Intravenous Once   FLUoxetine  20 mg Per Tube Daily   hydrocortisone sod succinate (SOLU-CORTEF) inj  50 mg Intravenous Q6H   insulin aspart  0-20 Units Subcutaneous Q4H   mouth rinse  15 mL Mouth Rinse 10 times per day   mupirocin ointment  1 application Nasal BID   pantoprazole (PROTONIX) IV  40 mg Intravenous Daily   sodium chloride flush  10-40 mL Intracatheter Q12H    Continuous Infusions:  sodium chloride     sodium chloride Stopped (04/24/19 0832)   dexmedetomidine (PRECEDEX) IV infusion Stopped (04/23/19 0758)   epinephrine Stopped (04/24/19 0604)   fentaNYL infusion INTRAVENOUS Stopped (04/24/19 0738)   meropenem (MERREM) IV Stopped (04/24/19 0540)   midazolam     norepinephrine (LEVOPHED) Adult infusion 24 mcg/min (04/24/19 1000)   prismasol BGK 4/2.5 2,000 mL/hr at 04/24/19 1028   sodium bicarbonate (isotonic) 1000 mL infusion 150 mL/hr at 04/24/19 0840   sodium bicarbonate (isotonic) 1000 mL infusion 150 mL/hr at 04/24/19 0956   vancomycin 1,250 mg (04/24/19 1000)   vasopressin (PITRESSIN) infusion - *FOR SHOCK* 0.03 Units/min (04/24/19 1000)    PRN Meds: sodium chloride, bisacodyl, docusate, fentaNYL, heparin, heparin, labetalol **AND** [DISCONTINUED] niCARDipine, midazolam, sodium chloride flush   Labs   Results for orders placed or performed during the hospital encounter  of 04/19/2019 (from the past 48 hour(s))  Glucose, capillary     Status: Abnormal   Collection Time: 05/08/2019 11:02 AM  Result Value Ref Range   Glucose-Capillary 249 (H) 70 - 99 mg/dL  TSH     Status: None   Collection Time: 04/21/2019 12:40 PM  Result Value Ref Range   TSH 2.237 0.350 - 4.500 uIU/mL    Comment: Performed by a 3rd Generation assay with a functional sensitivity of <=0.01 uIU/mL. Performed at [facility],  [place] [address], [place], [place] [address]   MRSA PCR Screening     Status: Abnormal   Collection Time: 05/15/2019  1:56 PM   Specimen: Nasal Mucosa; Nasopharyngeal  Result Value Ref Range   MRSA by PCR POSITIVE (A) NEGATIVE    Comment:        The GeneXpert MRSA Assay (FDA approved for NASAL specimens only), is one component of a comprehensive MRSA colonization surveillance program. It is not intended to diagnose MRSA infection nor to guide or monitor treatment for MRSA infections. RESULT CALLED TO, READ BACK BY AND VERIFIED WITH: Adrienne Mocha RN 16:20 05/07/2019 ([email or web address]) Performed at [facility], [place] [address], [place], [place] [address]   Glucose, capillary     Status: Abnormal   Collection Time: 04/24/2019  4:07 PM  Result Value Ref Range   Glucose-Capillary 141 (H) 70 - 99 mg/dL  Glucose, capillary     Status: Abnormal   Collection Time: 04/30/2019  7:31 PM  Result Value Ref Range   Glucose-Capillary 188 (H) 70 - 99 mg/dL  I-STAT 7, (LYTES, BLD GAS, ICA, H+H)     Status: Abnormal   Collection Time: 04/20/2019  9:08 PM  Result Value Ref Range   pH, Arterial 7.336 (L) 7.350 - 7.450   pCO2 arterial 24.1 (L) 32.0 - 48.0 mmHg   pO2, Arterial 67.0 (L) 83.0 - 108.0 mmHg   Bicarbonate 13.0 (L) 20.0 - 28.0 mmol/L   TCO2 14 (L) 22 - 32 mmol/L   O2 Saturation 93.0 %   Acid-base deficit 11.0 (H) 0.0 - 2.0 mmol/L   Sodium 134 (L) 135 - 145 mmol/L   Potassium 7.3 (HH) 3.5 - 5.1 mmol/L   Calcium, Ion 1.02 (L) 1.15 - 1.40 mmol/L   HCT 44.0 39.0 - 52.0 %   Hemoglobin 15.0 13.0 - 17.0 g/dL   Patient temperature 96.6 F    Collection site ARTERIAL LINE    Drawn by RT    Sample type ARTERIAL    Comment NOTIFIED PHYSICIAN   Potassium     Status: Abnormal   Collection Time: 04/23/2019  9:23 PM  Result Value Ref Range   Potassium 7.2 (HH) 3.5 - 5.1 mmol/L    Comment: CRITICAL RESULT CALLED TO, READ BACK BY AND VERIFIED WITH: RN R COE @2241  05/04/2019 BY S GEZAHEGN Performed at [facility], [address] [place], [place] [address]   Magnesium     Status: None   Collection Time: 04/26/2019  9:23 PM  Result Value Ref Range   Magnesium 2.1 1.7 - 2.4 mg/dL    Comment: Performed at [facility], [place] [address], [place], [place] [address]  Potassium     Status: Abnormal   Collection Time: 05/02/2019 10:08 PM  Result Value Ref Range   Potassium 5.6 (H) 3.5 - 5.1 mmol/L    Comment: Performed at [facility] [address], [place], [place] [address]  Digoxin  level     Status: Abnormal   Collection Time: 04/28/2019 10:08 PM  Result Value Ref Range   Digoxin Level 0.4 (L) 0.8 - 2.0 ng/mL    Comment: Performed at [facility], [place] [address], [place], [place] [address]  Glucose, capillary     Status: Abnormal   Collection Time: 05/14/2019 11:24 PM  Result Value Ref Range   Glucose-Capillary 218 (H) 70 - 99 mg/dL  I-STAT 7, (LYTES, BLD GAS, ICA, H+H)     Status: Abnormal   Collection Time: 05/12/2019 11:38 PM  Result Value Ref Range   pH, Arterial 7.283 (L) 7.350 - 7.450   pCO2 arterial 38.6 32.0 - 48.0 mmHg   pO2, Arterial 46.0 (L) 83.0 - 108.0 mmHg   Bicarbonate 17.8 (L) 20.0 - 28.0 mmol/L   TCO2 19 (L) 22 - 32 mmol/L   O2 Saturation 70.0 %   Acid-base deficit 7.0 (H) 0.0 - 2.0 mmol/L   Sodium 139 135 - 145 mmol/L   Potassium 5.1 3.5 - 5.1 mmol/L   Calcium, Ion 0.99 (L) 1.15 - 1.40 mmol/L   HCT 48.0 39.0 - 52.0 %   Hemoglobin 16.3 13.0 - 17.0 g/dL   Patient temperature 102.5 F    Collection site ARTERIAL LINE    Drawn by RT    Sample type ARTERIAL   Comprehensive metabolic panel     Status: Abnormal   Collection Time: 04/23/19 12:02 AM  Result Value Ref Range   Sodium 139 135 - 145 mmol/L   Potassium 5.0 3.5 - 5.1 mmol/L   Chloride 109 98 - 111 mmol/L   CO2 15 (L) 22 - 32 mmol/L   Glucose, Bld 220 (H) 70 - 99 mg/dL   BUN 23 (H) 6 - 20 mg/dL   Creatinine, Ser 2.64 (H) 0.61 - 1.24 mg/dL    Comment: DELTA CHECK NOTED   Calcium 7.7 (L) 8.9 - 10.3  mg/dL   Total Protein 6.1 (L) 6.5 - 8.1 g/dL   Albumin 2.9 (L) 3.5 - 5.0 g/dL   AST >10,000 (H) 15 - 41 U/L    Comment: RESULTS CONFIRMED BY MANUAL DILUTION   ALT 11,085 (H) 0 - 44 U/L    Comment: RESULTS CONFIRMED BY MANUAL DILUTION   Alkaline Phosphatase 88 38 - 126 U/L   Total Bilirubin 1.6 (H) 0.3 - 1.2 mg/dL   GFR calc non Af Amer 30 (L) >60 mL/min   GFR calc Af Amer 35 (L) >60 mL/min   Anion gap 15 5 - 15    Comment: Performed at [facility], [place] [address] [place], [place] [address]  CBC with Differential/Platelet     Status: Abnormal   Collection Time: 04/23/19  2:08 AM  Result Value Ref Range   WBC 21.9 (H) 4.0 - 10.5 K/uL   RBC 4.98 4.22 - 5.81 MIL/uL   Hemoglobin 15.4 13.0 - 17.0 g/dL   HCT 45.2 39.0 - 52.0 %   MCV 90.8 80.0 - 100.0 fL   MCH 30.9 26.0 - 34.0 pg   MCHC 34.1 30.0 - 36.0 g/dL   RDW 12.2 11.5 - 15.5 %   Platelets 171 150 - 400 K/uL    Comment: REPEATED TO VERIFY   nRBC 0.0 0.0 - 0.2 %   Neutrophils Relative % 88 %   Neutro Abs 19.2 (H) 1.7 - 7.7 K/uL   Lymphocytes Relative 3 %   Lymphs Abs 0.8 0.7 - 4.0 K/uL   Monocytes Relative  8 %   Monocytes Absolute 1.6 (H) 0.1 - 1.0 K/uL   Eosinophils Relative 0 %   Eosinophils Absolute 0.0 0.0 - 0.5 K/uL   Basophils Relative 0 %   Basophils Absolute 0.0 0.0 - 0.1 K/uL   Immature Granulocytes 1 %   Abs Immature Granulocytes 0.25 (H) 0.00 - 0.07 K/uL    Comment: Performed at [facility] [address], [place], [place] [address]  Magnesium     Status: None   Collection Time: 04/23/19  2:08 AM  Result Value Ref Range   Magnesium 1.9 1.7 - 2.4 mg/dL    Comment: Performed at [facility] [address] [place], [place] [address]  Hemoglobin A1c     Status: Abnormal   Collection Time: 04/23/19  3:27 AM  Result Value Ref Range   Hgb A1c MFr Bld 6.5 (H) 4.8 - 5.6 %    Comment: (NOTE) Pre diabetes:          5.7%-6.4% Diabetes:              >6.4% Glycemic control for   <7.0% adults with  diabetes    Mean Plasma Glucose 139.85 mg/dL    Comment: Performed at [facility] [address] [place],  [address]  Lipid panel     Status: Abnormal   Collection Time: 04/23/19  3:27 AM  Result Value Ref Range   Cholesterol 265 (H) 0 - 200 mg/dL   Triglycerides 191 (H) <150 mg/dL   HDL 24 (L) >40 mg/dL   Total CHOL/HDL Ratio 11.0 RATIO   VLDL 38 0 - 40 mg/dL   LDL Cholesterol 203 (H) 0 - 99 mg/dL    Comment:        Total Cholesterol/HDL:CHD Risk Coronary Heart Disease Risk Table                     Men   Women  1/2 Average Risk   3.4   3.3  Average Risk       5.0   4.4  2 X Average Risk   9.6   7.1  3 X Average Risk  23.4   11.0        Use the calculated Patient Ratio above and the CHD Risk Table to determine the patient's CHD Risk.        ATP III CLASSIFICATION (LDL):  <100     mg/dL   Optimal  100-129  mg/dL   Near or Above                    Optimal  130-159  mg/dL   Borderline  160-189  mg/dL   High  >190     mg/dL   Very High Performed at [facility] [address], [place], [place] [address]   Glucose, capillary     Status: Abnormal   Collection Time: 04/23/19  3:37 AM  Result Value Ref Range   Glucose-Capillary 197 (H) 70 - 99 mg/dL  Potassium     Status: None   Collection Time: 04/23/19  6:08 AM  Result Value Ref Range   Potassium 5.0 3.5 - 5.1 mmol/L    Comment: Performed at [facility], [place] [address], [place], [place] [address]  Glucose, capillary     Status: Abnormal   Collection Time: 04/23/19  8:13 AM  Result Value Ref Range   Glucose-Capillary 179 (H) 70 - 99  mg/dL   Comment 1 Notify RN    Comment 2 Document in Chart   Rapid urine drug screen (hospital performed)     Status: Abnormal   Collection Time: 04/23/19  9:30 AM  Result Value Ref Range   Opiates NONE DETECTED NONE DETECTED   Cocaine NONE DETECTED NONE DETECTED   Benzodiazepines POSITIVE (A) NONE DETECTED   Amphetamines NONE DETECTED NONE DETECTED    Tetrahydrocannabinol NONE DETECTED NONE DETECTED   Barbiturates NONE DETECTED NONE DETECTED    Comment: (NOTE) DRUG SCREEN FOR MEDICAL PURPOSES ONLY.  IF CONFIRMATION IS NEEDED FOR ANY PURPOSE, NOTIFY LAB WITHIN 5 DAYS. LOWEST DETECTABLE LIMITS FOR URINE DRUG SCREEN Drug Class                     Cutoff (ng/mL) Amphetamine and metabolites    1000 Barbiturate and metabolites    200 Benzodiazepine                 009 Tricyclics and metabolites     300 Opiates and metabolites        300 Cocaine and metabolites        300 THC                            50 Performed at [facility], [place] [address], [place], [place] [address]   Na and K (sodium & potassium), rand urine     Status: None   Collection Time: 04/23/19  9:30 AM  Result Value Ref Range   Sodium, Ur 17 mmol/L   Potassium Urine 96 mmol/L    Comment: Performed at [facility] [address], [place], [place] [address]  Urinalysis, Routine w reflex microscopic     Status: Abnormal   Collection Time: 04/23/19  9:30 AM  Result Value Ref Range   Color, Urine AMBER (A) YELLOW    Comment: BIOCHEMICALS MAY BE AFFECTED BY COLOR   APPearance CLOUDY (A) CLEAR   Specific Gravity, Urine 1.025 1.005 - 1.030   pH 6.0 5.0 - 8.0   Glucose, UA 50 (A) NEGATIVE mg/dL   Hgb urine dipstick LARGE (A) NEGATIVE   Bilirubin Urine NEGATIVE NEGATIVE   Ketones, ur NEGATIVE NEGATIVE mg/dL   Protein, ur >=300 (A) NEGATIVE mg/dL   Nitrite NEGATIVE NEGATIVE   Leukocytes,Ua NEGATIVE NEGATIVE   RBC / HPF >50 (H) 0 - 5 RBC/hpf   WBC, UA 11-20 0 - 5 WBC/hpf   Bacteria, UA FEW (A) NONE SEEN    Comment: Performed at [facility], [place] [address] [place], [place] [address]  Procalcitonin - Baseline     Status: None   Collection Time: 04/23/19  9:45 AM  Result Value Ref Range   Procalcitonin 8.21 ng/mL    Comment:        Interpretation: PCT > 2 ng/mL: Systemic infection (sepsis) is likely, unless other causes are known. (NOTE)        Sepsis PCT Algorithm           Lower Respiratory Tract                                      Infection PCT Algorithm    ----------------------------     ----------------------------         PCT < 0.25 ng/mL  PCT < 0.10 ng/mL         Strongly encourage             Strongly discourage   discontinuation of antibiotics    initiation of antibiotics    ----------------------------     -----------------------------       PCT 0.25 - 0.50 ng/mL            PCT 0.10 - 0.25 ng/mL               OR       >80% decrease in PCT            Discourage initiation of                                            antibiotics      Encourage discontinuation           of antibiotics    ----------------------------     -----------------------------         PCT >= 0.50 ng/mL              PCT 0.26 - 0.50 ng/mL               AND       <80% decrease in PCT              Encourage initiation of                                             antibiotics       Encourage continuation           of antibiotics    ----------------------------     -----------------------------        PCT >= 0.50 ng/mL                  PCT > 0.50 ng/mL               AND         increase in PCT                  Strongly encourage                                      initiation of antibiotics    Strongly encourage escalation           of antibiotics                                     -----------------------------                                           PCT <= 0.25 ng/mL                                                 OR                                        >  80% decrease in PCT                                     Discontinue / Do not initiate                                             antibiotics Performed at [facility], [place] [address] [place], [place] [address]   Troponin I (High Sensitivity)     Status: Abnormal   Collection Time: 04/23/19  9:45 AM  Result Value Ref Range   Troponin I (High Sensitivity) 16,274  (HH) <18 ng/L    Comment: CRITICAL RESULT CALLED TO, READ BACK BY AND VERIFIED WITH: RN J CARMICHAEL AT 1157 04/23/19 BY L BENFIELD (NOTE) Elevated high sensitivity troponin I (hsTnI) values and significant  changes across serial measurements may suggest ACS but many other  chronic and acute conditions are known to elevate hsTnI results.  Refer to the Links section for chest pain algorithms and additional  guidance. Performed at [facility], [place] [address] [place], [place] [address]   Lactic acid, plasma     Status: Abnormal   Collection Time: 04/23/19  9:45 AM  Result Value Ref Range   Lactic Acid, Venous 6.5 (HH) 0.5 - 1.9 mmol/L    Comment: CRITICAL RESULT CALLED TO, READ BACK BY AND VERIFIED WITH: T.BLACKBURN RN @ 2956 04/23/2019 BY C.EDENS Performed at [facility], [place] [address], [place], [place] [address]   Comprehensive metabolic panel     Status: Abnormal   Collection Time: 04/23/19  9:45 AM  Result Value Ref Range   Sodium 140 135 - 145 mmol/L   Potassium 4.6 3.5 - 5.1 mmol/L   Chloride 109 98 - 111 mmol/L   CO2 13 (L) 22 - 32 mmol/L   Glucose, Bld 229 (H) 70 - 99 mg/dL   BUN 28 (H) 6 - 20 mg/dL   Creatinine, Ser 4.21 (H) 0.61 - 1.24 mg/dL    Comment: DELTA CHECK NOTED   Calcium 7.5 (L) 8.9 - 10.3 mg/dL   Total Protein 5.6 (L) 6.5 - 8.1 g/dL   Albumin 2.8 (L) 3.5 - 5.0 g/dL   AST >10,000 (H) 15 - 41 U/L    Comment: RESULTS CONFIRMED BY MANUAL DILUTION   ALT 10,132 (H) 0 - 44 U/L    Comment: RESULTS CONFIRMED BY MANUAL DILUTION   Alkaline Phosphatase 97 38 - 126 U/L   Total Bilirubin 1.3 (H) 0.3 - 1.2 mg/dL   GFR calc non Af Amer 17 (L) >60 mL/min   GFR calc Af Amer 20 (L) >60 mL/min   Anion gap 18 (H) 5 - 15    Comment: Performed at [facility], [place] [address] [place], [place] [address]  Protime-INR     Status: Abnormal   Collection Time: 04/23/19  9:45 AM  Result Value Ref Range   Prothrombin Time 31.7 (H) 11.4 - 15.2 seconds   INR 3.1 (H)  0.8 - 1.2    Comment: (NOTE) INR goal varies based on device and disease states. Performed at [facility], [place] [address], [place], [place] [address]   Fibrinogen     Status: Abnormal   Collection Time: 04/23/19  9:45 AM  Result Value  Ref Range   Fibrinogen 117 (L) 210 - 475 mg/dL    Comment: Performed at [facility] [address], [place], [place] [address]  CBC with Differential/Platelet     Status: Abnormal   Collection Time: 04/23/19  9:45 AM  Result Value Ref Range   WBC 16.2 (H) 4.0 - 10.5 K/uL   RBC 4.75 4.22 - 5.81 MIL/uL   Hemoglobin 14.6 13.0 - 17.0 g/dL   HCT 44.0 39.0 - 52.0 %   MCV 92.6 80.0 - 100.0 fL   MCH 30.7 26.0 - 34.0 pg   MCHC 33.2 30.0 - 36.0 g/dL   RDW 12.2 11.5 - 15.5 %   Platelets 145 (L) 150 - 400 K/uL   nRBC 0.0 0.0 - 0.2 %   Neutrophils Relative % 83 %   Neutro Abs 13.5 (H) 1.7 - 7.7 K/uL   Lymphocytes Relative 11 %   Lymphs Abs 1.8 0.7 - 4.0 K/uL   Monocytes Relative 5 %   Monocytes Absolute 0.8 0.1 - 1.0 K/uL   Eosinophils Relative 0 %   Eosinophils Absolute 0.0 0.0 - 0.5 K/uL   Basophils Relative 0 %   Basophils Absolute 0.0 0.0 - 0.1 K/uL   Immature Granulocytes 1 %   Abs Immature Granulocytes 0.11 (H) 0.00 - 0.07 K/uL    Comment: Performed at [facility] [address] [place], [place] [address]  Brain natriuretic peptide     Status: Abnormal   Collection Time: 04/23/19  9:45 AM  Result Value Ref Range   B Natriuretic Peptide 2,909.9 (H) 0.0 - 100.0 pg/mL    Comment: Performed at [facility] [address] [place], [place] [address]  I-STAT 7, (LYTES, BLD GAS, ICA, H+H)     Status: Abnormal   Collection Time: 04/23/19 10:22 AM  Result Value Ref Range   pH, Arterial 7.263 (L) 7.350 - 7.450   pCO2 arterial 32.5 32.0 - 48.0 mmHg   pO2, Arterial 74.0 (L) 83.0 - 108.0 mmHg   Bicarbonate 14.7 (L) 20.0 - 28.0 mmol/L   TCO2 16 (L) 22 - 32 mmol/L   O2 Saturation 93.0 %   Acid-base deficit 11.0 (H) 0.0 - 2.0  mmol/L   Sodium 140 135 - 145 mmol/L   Potassium 4.7 3.5 - 5.1 mmol/L   Calcium, Ion 1.08 (L) 1.15 - 1.40 mmol/L   HCT 41.0 39.0 - 52.0 %   Hemoglobin 13.9 13.0 - 17.0 g/dL   Patient temperature 98.6 F    Collection site RADIAL, ALLEN'S TEST ACCEPTABLE    Drawn by RT    Sample type ARTERIAL   Culture, respiratory (non-expectorated)     Status: None (Preliminary result)   Collection Time: 04/23/19 10:58 AM   Specimen: Tracheal Aspirate; Respiratory  Result Value Ref Range   Specimen Description TRACHEAL ASPIRATE    Special Requests NONE    Gram Stain      FEW WBC PRESENT, PREDOMINANTLY PMN FEW GRAM NEGATIVE COCCOBACILLI FEW GRAM POSITIVE COCCI IN PAIRS IN CLUSTERS RARE GRAM POSITIVE RODS    Culture      CULTURE REINCUBATED FOR BETTER GROWTH Performed at [facility], [place] [address], [place], [place] [address]    Report Status PENDING   Culture, blood (Routine X 2) w Reflex to ID Panel     Status: None (Preliminary result)   Collection Time: 04/23/19 11:20 AM   Specimen: BLOOD  Result Value Ref Range   Specimen Description  BLOOD RIGHT ANTECUBITAL    Special Requests      AEROBIC BOTTLE ONLY Blood Culture results may not be optimal due to an inadequate volume of blood received in culture bottles Performed at [facility] [address] [place], [place] [address]    Culture NO GROWTH < 24 HOURS    Report Status PENDING   Culture, blood (Routine X 2) w Reflex to ID Panel     Status: None (Preliminary result)   Collection Time: 04/23/19 11:21 AM   Specimen: BLOOD  Result Value Ref Range   Specimen Description BLOOD RIGHT ANTECUBITAL    Special Requests      AEROBIC BOTTLE ONLY Blood Culture results may not be optimal due to an inadequate volume of blood received in culture bottles Performed at [facility] [address], [place], [place] [address]    Culture NO GROWTH < 24 HOURS    Report Status PENDING   Potassium     Status: None   Collection Time:  04/23/19 11:22 AM  Result Value Ref Range   Potassium 5.0 3.5 - 5.1 mmol/L    Comment: Performed at [facility], [place] [address] [place], [place] [address]  Lactic acid, plasma     Status: Abnormal   Collection Time: 04/23/19 11:22 AM  Result Value Ref Range   Lactic Acid, Venous 6.8 (HH) 0.5 - 1.9 mmol/L    Comment: CRITICAL VALUE NOTED.  VALUE IS CONSISTENT WITH PREVIOUSLY REPORTED AND CALLED VALUE. Performed at [facility], [place] [address] [place], [place] [address]   Ammonia     Status: Abnormal   Collection Time: 04/23/19 11:22 AM  Result Value Ref Range   Ammonia 68 (H) 9 - 35 umol/L    Comment: Performed at [facility], [place] [address] [place], [place] [address]  Troponin I (High Sensitivity)     Status: Abnormal   Collection Time: 04/23/19 11:22 AM  Result Value Ref Range   Troponin I (High Sensitivity) 14,226 (HH) <18 ng/L    Comment: CRITICAL VALUE NOTED.  VALUE IS CONSISTENT WITH PREVIOUSLY REPORTED AND CALLED VALUE. (NOTE) Elevated high sensitivity troponin I (hsTnI) values and significant  changes across serial measurements may suggest ACS but many other  chronic and acute conditions are known to elevate hsTnI results.  Refer to the Links section for chest pain algorithms and additional  guidance. Performed at [facility], [place] [address] [place], [place] [address]   Glucose, capillary     Status: Abnormal   Collection Time: 04/23/19 12:32 PM  Result Value Ref Range   Glucose-Capillary 182 (H) 70 - 99 mg/dL   Comment 1 Notify RN    Comment 2 Document in Chart   POCT I-Stat EG7     Status: Abnormal   Collection Time: 04/23/19 12:37 PM  Result Value Ref Range   pH, Ven 7.200 (L) 7.250 - 7.430   pCO2, Ven 39.5 (L) 44.0 - 60.0 mmHg   pO2, Ven 55.0 (H) 32.0 - 45.0 mmHg   Bicarbonate 15.2 (L) 20.0 - 28.0 mmol/L   TCO2 16 (L) 22 - 32 mmol/L   O2 Saturation 79.0 %   Acid-base deficit 12.0 (H) 0.0 - 2.0 mmol/L   Sodium 139 135 - 145 mmol/L    Potassium 5.0 3.5 - 5.1 mmol/L   Calcium, Ion 1.07 (L) 1.15 - 1.40 mmol/L   HCT 41.0 39.0 - 52.0 %   Hemoglobin 13.9  13.0 - 17.0 g/dL   Patient temperature 38.0 C    Sample type VENOUS   Prepare fresh frozen plasma     Status: None   Collection Time: 04/23/19  1:15 PM  Result Value Ref Range   Unit Number [id number]    Blood Component Type THAWED PLASMA    Unit division 00    Status of Unit ISSUED,FINAL    Transfusion Status      OK TO TRANSFUSE Performed at [facility], [place] [address], [place], [place] [address]    Unit Number [id number]    Blood Component Type THAWED PLASMA    Unit division 00    Status of Unit ISSUED,FINAL    Transfusion Status OK TO TRANSFUSE   Potassium     Status: Abnormal   Collection Time: 04/23/19  1:56 PM  Result Value Ref Range   Potassium 5.8 (H) 3.5 - 5.1 mmol/L    Comment: Performed at [facility], [place] [address] [place], [place] [address]  CK     Status: Abnormal   Collection Time: 04/23/19  1:56 PM  Result Value Ref Range   Total CK 602 (H) 49 - 397 U/L    Comment: Performed at [facility], [place] [address] [place], [place] [address]  ABO/Rh     Status: None   Collection Time: 04/23/19  2:15 PM  Result Value Ref Range   ABO/RH(D)      B POS Performed at [facility] [address], [place], [place] [address]   Potassium     Status: Abnormal   Collection Time: 04/23/19  4:56 PM  Result Value Ref Range   Potassium 5.9 (H) 3.5 - 5.1 mmol/L    Comment: Performed at [facility] [address], [place], [place] [address]  Lipase, blood     Status: Abnormal   Collection Time: 04/23/19  4:56 PM  Result Value Ref Range   Lipase 85 (H) 11 - 51 U/L    Comment: Performed at [facility] [address], [place], [place] [address]  Glucose, capillary     Status: Abnormal   Collection Time: 04/23/19  5:03 PM  Result Value Ref Range   Glucose-Capillary 259 (H) 70 - 99 mg/dL  Glucose, capillary      Status: Abnormal   Collection Time: 04/23/19  7:48 PM  Result Value Ref Range   Glucose-Capillary 243 (H) 70 - 99 mg/dL  Potassium     Status: None   Collection Time: 04/23/19 10:14 PM  Result Value Ref Range   Potassium 5.0 3.5 - 5.1 mmol/L    Comment: Performed at [facility], [place] [address], [place], [place] [address]  I-STAT 7, (LYTES, BLD GAS, ICA, H+H)     Status: Abnormal   Collection Time: 04/23/19 10:49 PM  Result Value Ref Range   pH, Arterial 7.123 (LL) 7.350 - 7.450   pCO2 arterial 41.5 32.0 - 48.0 mmHg   pO2, Arterial 133.0 (H) 83.0 - 108.0 mmHg   Bicarbonate 13.5 (L) 20.0 - 28.0 mmol/L   TCO2 15 (L) 22 - 32 mmol/L   O2 Saturation 98.0 %   Acid-base deficit 15.0 (H) 0.0 - 2.0 mmol/L   Sodium 139 135 - 145 mmol/L   Potassium 4.9 3.5 - 5.1 mmol/L   Calcium, Ion 0.93 (L) 1.15 - 1.40 mmol/L   HCT 40.0 39.0 - 52.0 %   Hemoglobin 13.6 13.0 -  17.0 g/dL   Patient temperature 99.1 F    Collection site ARTERIAL LINE    Drawn by RT    Sample type ARTERIAL    Comment NOTIFIED PHYSICIAN   Glucose, capillary     Status: Abnormal   Collection Time: 04/23/19 11:26 PM  Result Value Ref Range   Glucose-Capillary 283 (H) 70 - 99 mg/dL  Potassium     Status: Abnormal   Collection Time: 04/24/19  2:08 AM  Result Value Ref Range   Potassium 5.5 (H) 3.5 - 5.1 mmol/L    Comment: Performed at [facility], [place] [address] [place], [place] [address]  Glucose, capillary     Status: Abnormal   Collection Time: 04/24/19  3:26 AM  Result Value Ref Range   Glucose-Capillary 233 (H) 70 - 99 mg/dL  .Cooxemetry Panel (carboxy, met, total hgb, O2 sat)     Status: None   Collection Time: 04/24/19  4:16 AM  Result Value Ref Range   Total hemoglobin 13.8 12.0 - 16.0 g/dL   O2 Saturation 83.0 %   Carboxyhemoglobin 0.8 0.5 - 1.5 %   Methemoglobin 0.7 0.0 - 1.5 %    Comment: Performed at [facility], [place] [address] [place], [place] [address]  Procalcitonin     Status: None    Collection Time: 04/24/19  4:35 AM  Result Value Ref Range   Procalcitonin 26.87 ng/mL    Comment:        Interpretation: PCT >= 10 ng/mL: Important systemic inflammatory response, almost exclusively due to severe bacterial sepsis or septic shock. (NOTE)       Sepsis PCT Algorithm           Lower Respiratory Tract                                      Infection PCT Algorithm    ----------------------------     ----------------------------         PCT < 0.25 ng/mL                PCT < 0.10 ng/mL         Strongly encourage             Strongly discourage   discontinuation of antibiotics    initiation of antibiotics    ----------------------------     -----------------------------       PCT 0.25 - 0.50 ng/mL            PCT 0.10 - 0.25 ng/mL               OR       >80% decrease in PCT            Discourage initiation of                                            antibiotics      Encourage discontinuation           of antibiotics    ----------------------------     -----------------------------         PCT >= 0.50 ng/mL              PCT 0.26 - 0.50 ng/mL  AND       <80% decrease in PCT             Encourage initiation of                                             antibiotics       Encourage continuation           of antibiotics    ----------------------------     -----------------------------        PCT >= 0.50 ng/mL                  PCT > 0.50 ng/mL               AND         increase in PCT                  Strongly encourage                                      initiation of antibiotics    Strongly encourage escalation           of antibiotics                                     -----------------------------                                           PCT <= 0.25 ng/mL                                                 OR                                        > 80% decrease in PCT                                     Discontinue / Do not initiate                                              antibiotics Performed at [facility], [place] [address], [place], [place] [address]   Renal function panel (daily at 0500)     Status: Abnormal   Collection Time: 04/24/19  4:35 AM  Result Value Ref Range   Sodium 138 135 - 145 mmol/L   Potassium 5.7 (H) 3.5 - 5.1 mmol/L   Chloride 98 98 - 111 mmol/L   CO2 20 (L) 22 - 32 mmol/L   Glucose, Bld 276 (H) 70 - 99 mg/dL   BUN 29 (H) 6 - 20 mg/dL   Creatinine, Ser 5.17 (H) 0.61 - 1.24  mg/dL   Calcium 7.1 (L) 8.9 - 10.3 mg/dL   Phosphorus 4.9 (H) 2.5 - 4.6 mg/dL   Albumin 3.0 (L) 3.5 - 5.0 g/dL   GFR calc non Af Amer 13 (L) >60 mL/min   GFR calc Af Amer 16 (L) >60 mL/min   Anion gap 20 (H) 5 - 15    Comment: Performed at [facility] [address] [place], [place] [address]  Magnesium     Status: None   Collection Time: 04/24/19  4:35 AM  Result Value Ref Range   Magnesium 1.9 1.7 - 2.4 mg/dL    Comment: Performed at [facility] [address] [place], [place] [address]  Vancomycin, random     Status: None   Collection Time: 04/24/19  4:35 AM  Result Value Ref Range   Vancomycin Rm 13     Comment:        Random Vancomycin therapeutic range is dependent on dosage and time of specimen collection. A peak range is 20.0-40.0 ug/mL A trough range is 5.0-15.0 ug/mL        Performed at [facility] [address] [place], [place] [address]   Potassium     Status: Abnormal   Collection Time: 04/24/19  6:14 AM  Result Value Ref Range   Potassium 5.4 (H) 3.5 - 5.1 mmol/L    Comment: Performed at [facility] [address] [place], [place] [address]  I-STAT 7, (LYTES, BLD GAS, ICA, H+H)     Status: Abnormal   Collection Time: 04/24/19  6:33 AM  Result Value Ref Range   pH, Arterial 7.261 (L) 7.350 - 7.450   pCO2 arterial 55.0 (H) 32.0 - 48.0 mmHg   pO2, Arterial 100.0 83.0 - 108.0 mmHg   Bicarbonate 25.0 20.0 - 28.0 mmol/L   TCO2 27 22 - 32 mmol/L   O2 Saturation 97.0 %    Acid-base deficit 3.0 (H) 0.0 - 2.0 mmol/L   Sodium 136 135 - 145 mmol/L   Potassium 5.3 (H) 3.5 - 5.1 mmol/L   Calcium, Ion 0.95 (L) 1.15 - 1.40 mmol/L   HCT 40.0 39.0 - 52.0 %   Hemoglobin 13.6 13.0 - 17.0 g/dL   Patient temperature 36.2 C    Collection site ARTERIAL LINE    Drawn by Nurse    Sample type ARTERIAL   Glucose, capillary     Status: Abnormal   Collection Time: 04/24/19  8:21 AM  Result Value Ref Range   Glucose-Capillary 213 (H) 70 - 99 mg/dL   Comment 1 Notify RN    Comment 2 Document in Chart   CBC with Differential/Platelet     Status: Abnormal   Collection Time: 04/24/19  8:43 AM  Result Value Ref Range   WBC 17.6 (H) 4.0 - 10.5 K/uL   RBC 4.31 4.22 - 5.81 MIL/uL   Hemoglobin 13.7 13.0 - 17.0 g/dL   HCT 39.8 39.0 - 52.0 %   MCV 92.3 80.0 - 100.0 fL   MCH 31.8 26.0 - 34.0 pg   MCHC 34.4 30.0 - 36.0 g/dL   RDW 12.3 11.5 - 15.5 %   Platelets 137 (L) 150 - 400 K/uL   nRBC 0.0 0.0 - 0.2 %   Neutrophils Relative % 90 %   Neutro Abs 15.8 (H) 1.7 - 7.7 K/uL   Lymphocytes Relative 4 %   Lymphs Abs 0.8 0.7 - 4.0 K/uL   Monocytes Relative 3 %  Monocytes Absolute 0.5 0.1 - 1.0 K/uL   Eosinophils Relative 2 %   Eosinophils Absolute 0.4 0.0 - 0.5 K/uL   Basophils Relative 0 %   Basophils Absolute 0.0 0.0 - 0.1 K/uL   Immature Granulocytes 1 %   Abs Immature Granulocytes 0.11 (H) 0.00 - 0.07 K/uL    Comment: Performed at [facility] [address] [place], [place] [address]  Comprehensive metabolic panel     Status: Abnormal (Preliminary result)   Collection Time: 04/24/19  8:43 AM  Result Value Ref Range   Sodium 139 135 - 145 mmol/L   Potassium 5.3 (H) 3.5 - 5.1 mmol/L   Chloride 98 98 - 111 mmol/L   CO2 27 22 - 32 mmol/L   Glucose, Bld 206 (H) 70 - 99 mg/dL   BUN 28 (H) 6 - 20 mg/dL   Creatinine, Ser 4.77 (H) 0.61 - 1.24 mg/dL   Calcium 7.1 (L) 8.9 - 10.3 mg/dL   Total Protein 6.1 (L) 6.5 - 8.1 g/dL   Albumin 2.9 (L) 3.5 - 5.0 g/dL   AST PENDING  15 - 41 U/L   ALT PENDING 0 - 44 U/L   Alkaline Phosphatase 101 38 - 126 U/L   Total Bilirubin 2.6 (H) 0.3 - 1.2 mg/dL   GFR calc non Af Amer 15 (L) >60 mL/min   GFR calc Af Amer 17 (L) >60 mL/min   Anion gap 14 5 - 15    Comment: Performed at [facility], [place] [address] [place], [place] [address]  Protime-INR     Status: Abnormal   Collection Time: 04/24/19  8:43 AM  Result Value Ref Range   Prothrombin Time 20.3 (H) 11.4 - 15.2 seconds   INR 1.8 (H) 0.8 - 1.2    Comment: (NOTE) INR goal varies based on device and disease states. Performed at [facility], [place] [address], [place], [place] [address]   Fibrinogen     Status: None   Collection Time: 04/24/19  8:43 AM  Result Value Ref Range   Fibrinogen 306 210 - 475 mg/dL    Comment: Performed at [facility] [address] [place], [place] [address]  Lactic acid, plasma     Status: Abnormal   Collection Time: 04/24/19  8:43 AM  Result Value Ref Range   Lactic Acid, Venous 4.3 (HH) 0.5 - 1.9 mmol/L    Comment: CRITICAL VALUE NOTED.  VALUE IS CONSISTENT WITH PREVIOUSLY REPORTED AND CALLED VALUE. Performed at [facility], [place] [address] [place], [place] [address]   I-STAT 7, (LYTES, BLD GAS, ICA, H+H)     Status: Abnormal   Collection Time: 04/24/19  9:11 AM  Result Value Ref Range   pH, Arterial 7.275 (L) 7.350 - 7.450   pCO2 arterial 61.8 (H) 32.0 - 48.0 mmHg   pO2, Arterial 114.0 (H) 83.0 - 108.0 mmHg   Bicarbonate 28.7 (H) 20.0 - 28.0 mmol/L   TCO2 31 22 - 32 mmol/L   O2 Saturation 98.0 %   Sodium 137 135 - 145 mmol/L   Potassium 5.3 (H) 3.5 - 5.1 mmol/L   Calcium, Ion 0.93 (L) 1.15 - 1.40 mmol/L   HCT 40.0 39.0 - 52.0 %   Hemoglobin 13.6 13.0 - 17.0 g/dL   Patient temperature 98.6 F    Collection site RADIAL, ALLEN'S TEST ACCEPTABLE    Drawn by RT    Sample type ARTERIAL     ECG  N/A  Telemetry   Sinus rhythm in the 70s - Personally Reviewed  Radiology    CT HEAD WO  CONTRAST  Result Date: 04/23/2019 CLINICAL DATA:  Followup stroke.  Left MCA occlusion. EXAM: CT HEAD WITHOUT CONTRAST TECHNIQUE: Contiguous axial images were obtained from the base of the skull through the vertex without intravenous contrast. COMPARISON:  CT studies done yesterday. FINDINGS: Brain: No abnormality is seen affecting the brainstem or cerebellum. I think there is some artifactual low-density in the posterior fossa. Cannot completely rule out cerebellar infarctions but think that would be unlikely given the previous studies. Right hemisphere remains normal. I think there is a small area gray-white differentiation loss in the left frontal operculum. No hemorrhage or swelling. Vascular: No abnormal vascular finding. Skull: Negative Sinuses/Orbits: Clear/normal Other: None IMPRESSION: Small region of loss of gray-white differentiation in the left frontal operculum consistent with a fairly small area of acute infarction compared to the previous perfusion abnormality. No hemorrhage or mass effect. Areas of low-density in the cerebellum that I favor are artifactual. There did not seem to be any abnormalities in that region on the previous examinations. Electronically Signed   By: Nelson Chimes M.D.   On: 04/23/2019 05:01   US Abdomen Complete  Result Date: 04/23/2019 CLINICAL DATA:  34 year old male with elevated LFTs EXAM: ABDOMEN ULTRASOUND COMPLETE COMPARISON:  Hepatic liver Doppler examination performed concurrently. FINDINGS: Gallbladder: No gallstones or wall thickening visualized. No sonographic Murphy sign noted by sonographer. Common bile duct: Diameter: Normal at 3 mm Liver: The liver is diffusely heterogeneous and predominantly echogenic. Additionally, there is coarsening of the echotexture. No discrete lesion identified. Portal vein is patent on color Doppler imaging with normal direction of blood flow towards the liver. IVC: No abnormality visualized. Pancreas: Not well seen due to overlying  bowel gas. Spleen: Size and appearance within normal limits. Right Kidney: Length: 12.3 cm. Echogenicity within normal limits. No mass or hydronephrosis visualized. Left Kidney: Length: 12.5 cm. Echogenicity within normal limits. No mass or hydronephrosis visualized. Abdominal aorta: No aneurysm visualized. Other findings: None. IMPRESSION: 1. Diffusely heterogeneous and echogenic hepatic parenchyma. While nonspecific, the imaging appearance is most suggestive of hepatic steatosis. 2. No evidence of cholelithiasis or biliary ductal dilatation. Electronically Signed   By: Jacqulynn Cadet M.D.   On: 04/23/2019 14:54   DG Chest Port 1 View  Result Date: 04/23/2019 CLINICAL DATA:  Central line placement EXAM: PORTABLE CHEST 1 VIEW COMPARISON:  04/23/2019 FINDINGS: Interval placement of a right neck vascular sheath, tip positioned over the right brachiocephalic vein. Left neck vascular catheter remains in position, tip over the mid SVC. Endotracheal tube and esophagogastric tube in unchanged position. Slight interval increase in diffuse bilateral interstitial and heterogeneous airspace opacity. Cardiomegaly. IMPRESSION: 1. Interval placement of a right neck vascular sheath, tip positioned over the right brachiocephalic vein. 2.  Other support apparatus in unchanged position. 3. Slight interval increase in diffuse bilateral interstitial and heterogeneous airspace opacity, consistent with multifocal infection, edema, and/or ARDS. 4.  Cardiomegaly. Electronically Signed   By: Eddie Candle M.D.   On: 04/23/2019 16:36   DG Chest Port 1 View  Result Date: 04/23/2019 CLINICAL DATA:  Fever EXAM: PORTABLE CHEST 1 VIEW COMPARISON:  04/29/2019 FINDINGS: An endotracheal tube with tip 4 cm above the carina, NG tube entering the stomach with tip off the field of view and LEFT IJ central venous catheter with tip overlying the mid SVC again noted. Cardiomegaly again identified. Increasing patchy bilateral airspace opacities  noted. No definite pleural effusion or pneumothorax. IMPRESSION: Increasing patchy bilateral airspace opacities which may represent edema or infection. Electronically Signed   By: Margarette Canada M.D.   On: 04/23/2019 07:57   DG CHEST PORT 1 VIEW  Result Date: 05/14/2019 CLINICAL DATA:  Central line placement EXAM: PORTABLE CHEST 1 VIEW COMPARISON:  05/12/2019, 8:16 a.m. FINDINGS: Interval placement of like neck vascular catheter, tip positioned near the superior cavoatrial junction. Interval retraction of endotracheal tube, now positioned just above the thoracic inlet, approximately 6.5 cm above the carina. Interval placement of esophagogastric tube, tip and side port below the diaphragm. Unchanged cardiomegaly and mild, diffuse bilateral interstitial airspace opacity. IMPRESSION: 1. Interval placement of neck vascular catheter, tip positioned near the superior cavoatrial junction. 2. Interval retraction of endotracheal tube, now positioned just above the thoracic inlet, approximately 6.5 cm above the carina. 3. Interval placement of esophagogastric tube, tip and side port below the diaphragm. 4. Stable cardiomegaly and diffuse bilateral interstitial airspace opacity, likely edema. Electronically Signed   By: Eddie Candle M.D.   On: 04/26/2019 19:58   IR PATIENT EVAL TECH 0-60 MINS  Result Date: 04/23/2019 Darnelle Spangle     04/23/2019 10:29 AM 56f sheath pulled from right groin using manual pressure and quikclot hemostasis gauze at 0938 hrs.  Hemostasis achieved at 1003 hrs.  Groin reviewed with patient's RN JAnderson Malta  PT pulse on right side is dopplerable.  Unable to palpate or doppler DP pulse.  RN JAnderson Maltaaware of pedal pulse situation on right side.  Quikclot hemostasis gauze needs to be removed no later than 1000 hrs 04/24/2019.  [place]IVER DOPPLER  Result Date: 04/23/2019 CLINICAL DATA:  34year old male currently intubated and ventilated with elevated LFTs. EXAM: DUPLEX ULTRASOUND OF LIVER TECHNIQUE:  Color and duplex Doppler ultrasound was performed to evaluate the hepatic in-flow and out-flow vessels. COMPARISON:  None. Concurrently obtained abdominal ultrasound FINDINGS: Liver: Diffusely heterogeneous hepatic parenchyma which is coarsened in echotexture and predominantly echogenic. Normal hepatic contour without nodularity. Main Portal Vein size: 1 cm Portal Vein Velocities Main Prox:  48 cm/sec with normal hepatopetal flow Main Mid: 48 cm/sec Main Dist:  49 cm/sec Right: 55 cm/sec with normal hepatopetal flow Left: 56 cm/sec with normal hepatopetal flow Hepatic Vein Velocities Right:  44 cm/sec Middle:  132 cm/sec Left:  127 cm/sec IVC: Present and patent with normal respiratory phasicity. Hepatic Artery Velocity:  185 cm/sec Splenic Vein Velocity:  80 cm/sec Spleen: 5.9 x 4.4 x 10.1 cm with a total volume of 136 cm^3 (411 cm^3 is upper limit normal) Portal Vein Occlusion/Thrombus: No Splenic Vein Occlusion/Thrombus: No Ascites: None Varices: None IMPRESSION: 1. Widely patent portal veins with normal hepatopetal directional flow. 2. No evidence of hepatic venous thrombosis. Electronically Signed   By: HJacqulynn CadetM.D.   On: 04/23/2019 15:03    Cardiac Studies   N/A  Assessment   1. Active Problems: 2.   Occlusion of left middle cerebral artery 3.   Middle cerebral artery embolism, left 4.   Cerebrovascular accident (CVA) due to thrombosis of left middle cerebral artery ([facility] 5.   Shock ([place] 6.   Shock liver 7.   Respiratory failure, acute ([place] 8.   Plan   1. Continue current therapy, wean vaso preferentially over levophed to keep MAP >65. Now on CRRT to help with volume overload and acidemia. Co-ox is in the 80's - still feel this is predominantly septic shock.   Time Spent Directly with Patient:  I have spent a total of 35 minutes with the patient reviewing hospital notes, telemetry, EKGs, labs and examining the patient as well as establishing an assessment and plan that was  discussed personally with the patient.  > 50% of time was spent in direct patient care.  Length of Stay:  LOS: 2 days   Pixie Casino, MD, [facility], [place] Director of the Advanced Lipid Disorders &  Cardiovascular Risk Reduction Clinic Diplomate of the American Board of Clinical Lipidology Attending Cardiologist  Direct Dial: [phone]   Fax: [phone]  Website:  [email or web address] Lakynn Halvorsen 04/24/2019, 10:59 AM

## 2019-04-24 NOTE — Progress Notes (Signed)
CRITICAL VALUE ALERT  Critical Value:  Troponin 16,274  Date & Time Notied:  04/23/19 1000  Provider Notified: Dr. Duwayne Heck  Orders Received/Actions taken: MD aware.

## 2019-04-24 NOTE — Progress Notes (Signed)
Assisted tele visit to patient with family member.  Mahira Petras P, RN  

## 2019-04-24 NOTE — Progress Notes (Signed)
[facility] Gastroenterology Progress Note  Carlos Brewer. 34 y.o. 30-Apr-1985   Subjective: Intubated. Not responding. Pressors being weaned. No BM overnight. Family and nurse in room.  Objective: Vital signs: Vitals:   04/24/19 1015 04/24/19 1030  BP:    Pulse: 81 81  Resp: (!) 23 (!) 25  Temp: 97.9 F (36.6 C) 98.1 F (36.7 C)  SpO2: 100% 100%  BP 104/65  Physical Exam: Gen: intubated, obese  CV: RRR Chest: Coarse breath sounds Abd: soft, nontender (no facial grimace), nondistended, +BS Ext: no edema  Lab Results: Recent Labs    04/23/19 0208 04/23/19 0608 04/23/19 0945 04/23/19 1022 04/24/19 0435 04/24/19 0614 04/24/19 0633 04/24/19 0911  NA  --   --  140   < > 138   < > 136 137  K  --    < > 4.6   < > 5.7*   < > 5.3* 5.3*  CL  --   --  109  --  98  --   --   --   CO2  --   --  13*  --  20*  --   --   --   GLUCOSE  --   --  229*  --  276*  --   --   --   BUN  --   --  28*  --  29*  --   --   --   CREATININE  --   --  4.21*  --  5.17*  --   --   --   CALCIUM  --   --  7.5*  --  7.1*  --   --   --   MG 1.9  --   --   --  1.9  --   --   --   PHOS  --   --   --   --  4.9*  --   --   --    < > = values in this interval not displayed.   Recent Labs    04/23/19 0002 04/23/19 0002 04/23/19 0945 04/24/19 0435  AST >10,000*  --  >10,000*  --   ALT 11,085*  --  10,132*  --   ALKPHOS 88  --  97  --   BILITOT 1.6*  --  1.3*  --   PROT 6.1*  --  5.6*  --   ALBUMIN 2.9*   < > 2.8* 3.0*   < > = values in this interval not displayed.   Recent Labs    04/23/19 0945 04/23/19 1022 04/24/19 0843 04/24/19 0911  WBC 16.2*  --  17.6*  --   NEUTROABS 13.5*  --  15.8*  --   HGB 14.6   < > 13.7 13.6  HCT 44.0   < > 39.8 40.0  MCV 92.6  --  92.3  --   PLT 145*  --  137*  --    < > = values in this interval not displayed.   INR 1.8 (3.1)   Assessment/Plan: Shock liver with markedly elevated LFTs. Today's LFTs pending. Abd U/S with dopplers unrevealing. Ok  to give minimal doses of Tylenol for high fevers. Supportive care. No new GI recs. GI will sign off. Call us back if questions.   Carlos Brewer 04/24/2019, 10:33 AM  Questions please call [phone] ID: Carlos Brewer., male   DOB: 1986/01/15, 34 y.o.   MRN: [id number]

## 2019-04-24 NOTE — Progress Notes (Signed)
STROKE TEAM PROGRESS NOTE   INTERVAL HISTORY His RN is at the bedside.  Patient's condition remains critically ill with progressive metabolic acidosis, sepsis and worsening chest x-ray showing infiltrates versus ARDS.  Blood pressure has been stabilized on Levophed and vasopressin and FiO2 is weaned down to 0.9 this morning.  Sedation has been stopped a few hours ago but he remains unresponsive to noxious stimulation.  Repeat LFTs yet pending  OBJECTIVE Vitals:   04/24/19 0745 04/24/19 0800 04/24/19 0829 04/24/19 0830  BP:  105/69  (!) 99/49  Pulse: 81 78  85  Resp: (!) 26 (!) 27  (!) 24  Temp: (!) 97.3 F (36.3 C) (!) 97.5 F (36.4 C)  97.7 F (36.5 C)  TempSrc:      SpO2: 100% 100% 100% 100%  Weight:      Height:        CBC:  Recent Labs  Lab 04/23/19 0208 04/23/19 0208 04/23/19 0945 04/23/19 1022 04/23/19 2249 04/24/19 0633  WBC 21.9*  --  16.2*  --   --   --   NEUTROABS 19.2*  --  13.5*  --   --   --   HGB 15.4   < > 14.6   < > 13.6 13.6  HCT 45.2   < > 44.0   < > 40.0 40.0  MCV 90.8  --  92.6  --   --   --   PLT 171  --  145*  --   --   --    < > = values in this interval not displayed.    Basic Metabolic Panel:  Recent Labs  Lab 04/23/19 0208 04/23/19 0608 04/23/19 0945 04/23/19 1022 04/24/19 0435 04/24/19 0435 04/24/19 0614 04/24/19 0633  NA  --   --  140   < > 138  --   --  136  K  --    < > 4.6   < > 5.7*   < > 5.4* 5.3*  CL  --   --  109  --  98  --   --   --   CO2  --   --  13*  --  20*  --   --   --   GLUCOSE  --   --  229*  --  276*  --   --   --   BUN  --   --  28*  --  29*  --   --   --   CREATININE  --   --  4.21*  --  5.17*  --   --   --   CALCIUM  --   --  7.5*  --  7.1*  --   --   --   MG 1.9  --   --   --  1.9  --   --   --   PHOS  --   --   --   --  4.9*  --   --   --    < > = values in this interval not displayed.    Lipid Panel:     Component Value Date/Time   CHOL 265 (H) 04/23/2019 0327   TRIG 191 (H) 04/23/2019 0327   HDL  24 (L) 04/23/2019 0327   CHOLHDL 11.0 04/23/2019 0327   VLDL 38 04/23/2019 0327   LDLCALC 203 (H) 04/23/2019 0327   HgbA1c:  Lab Results  Component Value Date   HGBA1C 6.5 (H) 04/23/2019  Urine Drug Screen:     Component Value Date/Time   LABOPIA NONE DETECTED 04/23/2019 0930   COCAINSCRNUR NONE DETECTED 04/23/2019 0930   COCAINSCRNUR NONE DETECTED 02/04/2018 1010   LABBENZ POSITIVE (A) 04/23/2019 0930   AMPHETMU NONE DETECTED 04/23/2019 0930   THCU NONE DETECTED 04/23/2019 0930   LABBARB NONE DETECTED 04/23/2019 0930    Alcohol Level     Component Value Date/Time   ETH <10 05/06/2019 0355    IMAGING  CT Code Stroke CTA Head W/WO contrast CT Code Stroke CTA Neck W/WO contrast 05/02/2019 IMPRESSION:  1. Positive for emergent large vessel occlusion at the Left MCA bifurcation. There is reconstitution of the dominant posterior division branch.  2. Elsewhere negative CTA head and neck. Dominant right vertebral artery which supplies the basilar. Fetal type PCA origins.   MRI Head WO Contrast - pending 04/23/2019  CT HEAD WO CONTRAST 04/23/2019 IMPRESSION:  Small region of loss of gray-white differentiation in the left frontal operculum consistent with a fairly small area of acute infarction compared to the previous perfusion abnormality. No hemorrhage or mass effect. Areas of low-density in the cerebellum that I favor are artifactual. There did not seem to be any abnormalities in that region on the previous examinations.   CT CEREBRAL PERFUSION W CONTRAST 05/03/2019 IMPRESSION:  1. CTP detects evidence of Left MCA territory oligemia concordant with the CTA findings.  2. No infarct core detected with standard CBF <30%.  3. 60 mL of penumbra detected with standard T-max > 6s.   CT HEAD CODE STROKE WO CONTRAST 04/30/2019 IMPRESSION:  1. Asymmetric hyperdensity of a Left MCA branch in the Sylvian fissure suspicious for emergent large vessel occlusion in the setting.  2. No  associated acute cortically based infarct, ASPECTS 10. No acute hemorrhage.  3. Chronic right frontal operculum encephalomalacia.    DG CHEST PORT 1 VIEW 05/10/2019 IMPRESSION:  1. Interval placement of neck vascular catheter, tip positioned near the superior cavoatrial junction.  2. Interval retraction of endotracheal tube, now positioned just above the thoracic inlet, approximately 6.5 cm above the carina.  3. Interval placement of esophagogastric tube, tip and side port below the diaphragm.  4. Stable cardiomegaly and diffuse bilateral interstitial airspace opacity, likely edema.   DG Chest Port 1 View 04/29/2019 IMPRESSION:  1. Endotracheal tube tip noted just above the carina. Proximal repositioning of 2-3 should be considered.  2. Cardiomegaly again noted. Diffuse bilateral pulmonary infiltrates/edema. Small left pleural effusion.   DG Chest Port 1 View  04/23/19 IMPRESSION: 1. Interval placement of a right neck vascular sheath, tip positioned over the right brachiocephalic vein. 2.  Other support apparatus in unchanged position. 3. Slight interval increase in diffuse bilateral interstitial and heterogeneous airspace opacity, consistent with multifocal infection, edema, and/or ARDS. 4.  Cardiomegaly.  DG Abd Portable 1V 05/01/2019 IMPRESSION:  OG tube tip in the distal stomach.   ECHOCARDIOGRAM COMPLETE 04/19/2019 IMPRESSIONS   1. Left ventricular ejection fraction, by visual estimation, is <20%. The left ventricle has severely decreased function. There is no left ventricular hypertrophy.   2. Apex is not well-visualized. In setting of stroke with severe LV systolic dysfunction, recommend repeat TTE with contrast to rule out LV apical thrombus   3. Severely dilated left ventricular internal cavity size.   4. Left ventricular diastolic parameters are consistent with Grade III diastolic dysfunction (restrictive).   5. Elevated left atrial pressure.   6. The left ventricle  demonstrates global hypokinesis.   7. Global right  ventricle has normal systolic function.The right ventricular size is mildly enlarged.   8. Left atrial size was moderately dilated.   9. Right atrial size was normal.  10. The mitral valve is normal in structure. Mild mitral valve regurgitation.  11. The tricuspid valve is normal in structure. Tricuspid valve regurgitation is trivial.  12. The aortic valve is normal in structure. Aortic valve regurgitation is not visualized. No evidence of aortic valve sclerosis or stenosis.  13. The pulmonic valve was not well visualized. Pulmonic valve regurgitation is not visualized.   Interventional Neuro Radiology - Cerebral Angiogram with Intervention 05/13/2019 S/P Lt common carotid arteriogram followed by complete revascularization of occluded LT MCA sup division mid M2 seg with x 1 pass with 32mx 40 mm solitaire X ret river device and penumbra aspiration with TICI 3 revascularization  ECG - SR rate 93 BPM. (See cardiology reading for complete details)  PHYSICAL EXAM Blood pressure (!) 99/49, pulse 85, temperature 97.7 F (36.5 C), resp. rate (!) 24, height 6' 1"  (1.854 m), weight 133 kg, SpO2 100 %. Morbidly obese young African-American male who is sedated intubated and in respiratory distress. . Afebrile. Head is nontraumatic. Neck is supple without bruit.    Cardiac exam no murmur or gallop. Lungs are clear to auscultation. Distal pulses are well felt.  Neurological Exam :   Patient is unresponsive with eyes closed.  He does not follow any commands.  Eyes are in primary position.  Pupils 4 mm reactive.  Corneal reflexes are present.  Doll's eye movements are sluggish.  Fundi not visualized.  There is minimum response to sternal rub.  Trace withdrawal in the lower extremities to painful stimuli none in the upper extremities.   ASSESSMENT/PLAN Mr. LRenold Brewer is a 34y.o. male with history of NICM EF 15-20%, CHF, DM, Obesity, tobacco  use, OSA, and hx of cocaine use presenting with aphasia and right sided weakness.  The patient received IV t-PA on Friday 04/30/2019 at 4:45 AM. Mechanical thrombectomy left M2. Patient seems to have developed sepsis with cardiogenic versus septic shock and shock liver and refractory hypoxemia Stroke:  L MCA infarct - embolic - likely secondary to low EF  Resultant aphasia  Code Stroke CT Head -  Asymmetric hyperdensity of a Left MCA branch in the Sylvian fissure suspicious for emergent large vessel occlusion in the setting. No associated acute cortically based infarct, ASPECTS 10.   CT head - Small region of loss of gray-white differentiation in the left frontal operculum consistent with a fairly small area of acute infarction compared to the previous perfusion abnormality. Areas of low-density in the cerebellum that I favor are artifactual.  MRI head - pending  MRA head - not ordered  CTA H&N - Positive for emergent large vessel occlusion at the Left MCA bifurcation. Elsewhere negative CTA head and neck.  CT Perfusion - CTP detects evidence of Left MCA territory oligemia concordant with the CTA findings. No infarct core detected with standard CBF <30%. 60 mL of penumbra detected with standard T-max > 6s.   Carotid Doppler - CTA neck performed - carotid dopplers not indicated.  2D Echo - EF < 20% TEE recommended  SHilton HotelsVirus 2 - negative  LDL - 203  HgbA1c - 6.5  UDS - pending  VTE prophylaxis - SCDs Diet  Diet Order            Diet NPO time specified  Diet effective now  aspirin 81 mg daily prior to admission, now on No antithrombotic s/p tPA  Patient counseled to be compliant with his antithrombotic medications  Ongoing aggressive stroke risk factor management  Therapy recommendations:  pending  Disposition:  Pending  Hypertension  Home BP meds: Coreg, Entresto  Current BP meds: Labetalol and Cardene prn - Levophed  Stable . Permissive  hypertension (OK if < 220/120) but gradually normalize in 5-7 days . Long-term BP goal normotensive  Hyperlipidemia  Home Lipid lowering medication: Lipitor 40 mg daily  LDL 203, goal < 70  Current lipid lowering medication: Lipitor 80 mg daily   Continue statin at discharge  Diabetes  Home diabetic meds: insulin  Current diabetic meds: insulin  HgbA1c 6.5, goal < 7.0 Recent Labs    04/23/19 2326 04/24/19 0326 04/24/19 0821  GLUCAP 283* 233* 213*    Other Stroke Risk Factors  Cigarette smoker - advised to stop smoking  ETOH use, advised to drink no more than 1 alcoholic beverage per day.  Obesity, Body mass index is 38.68 kg/m., recommend weight loss, diet and exercise as appropriate   Obstructive sleep apnea - uses Cpap  Congestive Heart Failure  Substance Abuse Hx.  EF 15 - 20 %  FEVER   103.2->97.5  WBCs 21->pending   CXR - consistent with multifocal infection, edema, and/or ARDS   Vancomycin started 04/23/19  U/A - (a few bacteria ; wbcs - normal) Urine culture - pending   Blood cultures - no growth < 24 hrs  Other Active Problems  Code status - Full code  AKI - creatinine - 1.10->2.64->5.17  (renal consult 04/23/19)  Hypotension -> Levophed  Hyperkalemia - 7.2->5.0->5.7->5.4->5.3  Intubated / NPO  Shock liver elevated transaminases (GI consult 04/23/19)  Sepsis  Heart failure-acute on chronic systolic heart failure (cardiology consult 04/23/19)   Hospital day # 2    Patient presented with aphasia due to left M2 occlusion and underwent successful thrombectomy and follow-up CT scan only shows a small left MCA branch infarct but unfortunately has medically declined and developed likely septic versus cardiogenic shock as well as shock liver and remains in respiratory failure. Discussion with Dr. Marchelle Gearing critical care medicine..  Continue cardiorespiratory support as well as treatment for infection and appreciate GI consult for hepatic  failure and nephrology for renal failure and cardiology for heart failure.  Patient will remain intubated till medical parameters improve over the next few days.  Continue aspirin for stroke prevention for now unless it has to be held due to his bleeding and liver dysfunction. This patient is critically ill and at significant risk of neurological worsening, death and care requires constant monitoring of vital signs, hemodynamics,respiratory and cardiac monitoring, extensive review of multiple databases, frequent neurological assessment, discussion with family, other specialists and medical decision making of high complexity.I have made any additions or clarifications directly to the above note.This critical care time does not reflect procedure time, or teaching time or supervisory time of PA/NP/Med Resident etc but could involve care discussion time.  I spent 35 minutes of neurocritical care time  in the care of  this patient.   Antony Contras, MD     To contact Stroke Continuity provider, please refer to [email or web address] After hours, contact General Neurology

## 2019-04-24 NOTE — Progress Notes (Signed)
Referring Physician(s): Code Stroke- Greta Doom  Supervising Physician: Luanne Bras  Patient Status:  Carlos Brewer - In-pt  Chief Complaint: None- intubated with sedation  Subjective:  Acute CVA s/p cerebral arteriogram with emergent mechanical thrombectomy of left MCA superior division mid M2 segment achieving a TICI3revascularization 05/13/2019 by Dr. Estanislado Pandy. Patient laying in bed intubated with sedation. No spontaneous movements of extremities however heavily sedated. On CRRT. Right groin incision c/d/i.   Allergies: Patient has no known allergies.  Medications: Prior to Admission medications   Medication Sig Start Date End Date Taking? Authorizing Provider  albuterol (ACCUNEB) 1.25 MG/3ML nebulizer solution Take 3 mLs by nebulization 4 (four) times daily as needed for wheezing or shortness of breath. 08/23/18  Yes [provider]  albuterol (VENTOLIN HFA) 108 (90 Base) MCG/ACT inhaler Inhale 1-2 puffs into the lungs every 6 (six) hours as needed for wheezing or shortness of breath. 07/17/18  Yes Florencia Reasons, MD  aspirin EC 81 MG EC tablet Take 1 tablet (81 mg total) by mouth daily. Patient taking differently: Take 81 mg by mouth at bedtime.  10/30/16  Yes Demetrios Loll, MD  atorvastatin (LIPITOR) 40 MG tablet Take 1 tablet (40 mg total) by mouth daily at 6 PM. 08/23/18  Yes Bensimhon, Shaune Pascal, MD  bacitracin ointment Apply 1 application topically 2 (two) times daily. Patient taking differently: Apply 1 application topically 2 (two) times daily as needed for wound care.  08/28/18  Yes Montine Circle, PA-C  carvedilol (COREG) 3.125 MG tablet Take 1 tablet (3.125 mg total) by mouth 2 (two) times daily with a meal. 08/23/18  Yes Bensimhon, Shaune Pascal, MD  digoxin (LANOXIN) 0.125 MG tablet Take 1 tablet (0.125 mg total) by mouth daily. 08/23/18  Yes Bensimhon, Shaune Pascal, MD  FLUoxetine (PROZAC) 20 MG capsule Take 1 capsule (20 mg total) by mouth daily. 12/19/18  Yes Gherghe,  Vella Redhead, MD  insulin aspart (NOVOLOG) 100 UNIT/ML injection Inject 10 Units into the skin 3 (three) times daily before meals.   Yes [provider]  insulin detemir (LEVEMIR) 100 UNIT/ML injection Inject 40 Units into the skin at bedtime.    Yes [provider]  metolazone (ZAROXOLYN) 2.5 MG tablet Take 1 tablet (2.5 mg total) by mouth daily as needed for up to 30 days. Take metolazone 2.5mg  x1 only for 5pound weight gain, please take potassium 36meq x1 with metolazone. Patient taking differently: Take 2.5 mg by mouth daily as needed (for 5 lb weight gain (take additional potassium 20 meq with each dose).  07/17/18 06/15/19 Yes Florencia Reasons, MD  Multiple Vitamin (MULTIVITAMIN WITH MINERALS) TABS tablet Take 1 tablet by mouth daily with lunch.   Yes [provider]  pantoprazole (PROTONIX) 40 MG tablet Take 40 mg by mouth daily as needed (acid reflux/indigestion).    Yes [provider]  potassium chloride SA (K-DUR) 20 MEQ tablet Take 1 tablet (20 mEq total) by mouth daily. Can take extra one if taking metolazone. Patient taking differently: Take 20 mEq by mouth See admin instructions. Take one tablet (20 meq) by mouth every morning, also take an extra tablet (20 meq) with each dose of metolazone 08/23/18  Yes Bensimhon, Shaune Pascal, MD  sacubitril-valsartan (ENTRESTO) 24-26 MG Take 1 tablet by mouth 2 (two) times daily. 08/23/18  Yes Bensimhon, Shaune Pascal, MD  spironolactone (ALDACTONE) 25 MG tablet Take 1 tablet (25 mg total) by mouth daily. Patient taking differently: Take 25 mg by mouth at bedtime.  08/23/18  Yes Bensimhon, Shaune Pascal, MD  torsemide (DEMADEX) 20 MG tablet Take 2 tablets (40 mg total) by mouth 2 (two) times daily. Patient taking differently: Take 40 mg by mouth daily.  08/23/18  Yes Bensimhon, Shaune Pascal, MD  Vitamin D, Ergocalciferol, (DRISDOL) 1.25 MG (50000 UNIT) CAPS capsule Take 50,000 Units by mouth every Thursday.   Yes [provider]     Vital  Signs: BP 101/64   Pulse 79   Temp 97.9 F (36.6 C)   Resp (!) 26   Ht 6\' 1"  (1.854 m)   Wt 293 lb 3.4 oz (133 kg)   SpO2 100%   BMI 38.68 kg/m   Physical Exam Vitals and nursing note reviewed.  Constitutional:      Appearance: He is ill-appearing.     Comments: Intubated with sedation.   Pulmonary:     Effort: Pulmonary effort is normal. No respiratory distress.     Comments: Intubated with sedation. Skin:    General: Skin is warm and dry.  Neurological:     Comments: Intubated with sedation. PERRL bilaterally. No spontaneous movements of extremities. Distal pulses Dopplerable bilaterally (DPs and PTs).  Psychiatric:     Comments: Intubated with sedation.      Imaging: CT Code Stroke CTA Head W/WO contrast  Result Date: 04/27/2019 CLINICAL DATA:  34 year old male code stroke presentation. Suspicious left MCA sylvian fissure branch hyperdensity. History of cardiac issues with decreased cardiac ejection fraction. EXAM: CT ANGIOGRAPHY HEAD AND NECK TECHNIQUE: Multidetector CT imaging of the head and neck was performed using the standard protocol during bolus administration of intravenous contrast. Multiplanar CT image reconstructions and MIPs were obtained to evaluate the vascular anatomy. Carotid stenosis measurements (when applicable) are obtained utilizing NASCET criteria, using the distal internal carotid diameter as the denominator. CONTRAST:  20mL OMNIPAQUE IOHEXOL 350 MG/ML SOLN COMPARISON:  Plain head CT 0402 hours today. FINDINGS: CTA NECK Skeleton: Lower cervical spine endplate degeneration. Mild reversal of lordosis. Upper chest: Negative upper lungs and mediastinum. Other neck: Negative. Aortic arch: Suboptimal but adequate contrast bolus. Three vessel arch configuration with no arch atherosclerosis. Right carotid system: Negative right CCA and right carotid bifurcation. Tortuous but otherwise negative cervical right ICA. Left carotid system: Negative left CCA and left  carotid bifurcation. Negative cervical left ICA. Vertebral arteries: The proximal right subclavian artery and cervical right vertebral artery appear normal. The right vertebral is dominant. Normal proximal left subclavian artery. The non dominant left vertebral artery is diminutive but remains patent to the cisterna magna. CTA HEAD Posterior circulation: The non dominant left vertebral artery seems to functionally terminates in PICA and/or an anterior spinal artery. The right vertebral artery supplies the basilar. No right vertebral or basilar artery stenosis identified. But both vessels are somewhat diminutive. Both posterior communicating arteries are present. Patent SCA origins. Overall fetal type PCAs. Bilateral PCA branches are grossly normal. Anterior circulation: Both ICA siphons are patent without plaque or stenosis. Normal posterior communicating artery origins. Patent carotid termini. Normal MCA and ACA origins. Bilateral ACA branches are within normal limits. Right MCA M1 and bifurcation are patent without stenosis. Right MCA branches are within normal limits. The left MCA M1 is patent and gives off an anterior temporal artery on series 9, image 118. Then at the expected location of the left MCA bifurcation or trifurcation the vessel is occluded (series 11, image 20, series 5, image 57). There is reconstitution of the dominant posterior division branch. Venous sinuses: Early contrast timing,  not evaluated. Anatomic variants: Dominant right vertebral artery, the left appears to terminates in PICA and/or the anterior spinal artery. Fetal type PCA origins. Review of the MIP images confirms the above findings IMPRESSION: 1. Positive for emergent large vessel occlusion at the Left MCA bifurcation. There is reconstitution of the dominant posterior division branch. This preliminary result was communicated to Dr. Leonel Ramsay at 0413 hours, and then we discussed by telephone. 2. Elsewhere negative CTA head and  neck. Dominant right vertebral artery which supplies the basilar. Fetal type PCA origins. Electronically Signed   By: Genevie Ann M.D.   On: 05/01/2019 04:22   CT HEAD WO CONTRAST  Result Date: 04/23/2019 CLINICAL DATA:  Followup stroke.  Left MCA occlusion. EXAM: CT HEAD WITHOUT CONTRAST TECHNIQUE: Contiguous axial images were obtained from the base of the skull through the vertex without intravenous contrast. COMPARISON:  CT studies done yesterday. FINDINGS: Brain: No abnormality is seen affecting the brainstem or cerebellum. I think there is some artifactual low-density in the posterior fossa. Cannot completely rule out cerebellar infarctions but think that would be unlikely given the previous studies. Right hemisphere remains normal. I think there is a small area gray-white differentiation loss in the left frontal operculum. No hemorrhage or swelling. Vascular: No abnormal vascular finding. Skull: Negative Sinuses/Orbits: Clear/normal Other: None IMPRESSION: Small region of loss of gray-white differentiation in the left frontal operculum consistent with a fairly small area of acute infarction compared to the previous perfusion abnormality. No hemorrhage or mass effect. Areas of low-density in the cerebellum that I favor are artifactual. There did not seem to be any abnormalities in that region on the previous examinations. Electronically Signed   By: Nelson Chimes M.D.   On: 04/23/2019 05:01   CT Code Stroke CTA Neck W/WO contrast  Result Date: 04/28/2019 CLINICAL DATA:  33 year old male code stroke presentation. Suspicious left MCA sylvian fissure branch hyperdensity. History of cardiac issues with decreased cardiac ejection fraction. EXAM: CT ANGIOGRAPHY HEAD AND NECK TECHNIQUE: Multidetector CT imaging of the head and neck was performed using the standard protocol during bolus administration of intravenous contrast. Multiplanar CT image reconstructions and MIPs were obtained to evaluate the vascular anatomy.  Carotid stenosis measurements (when applicable) are obtained utilizing NASCET criteria, using the distal internal carotid diameter as the denominator. CONTRAST:  65mL OMNIPAQUE IOHEXOL 350 MG/ML SOLN COMPARISON:  Plain head CT 0402 hours today. FINDINGS: CTA NECK Skeleton: Lower cervical spine endplate degeneration. Mild reversal of lordosis. Upper chest: Negative upper lungs and mediastinum. Other neck: Negative. Aortic arch: Suboptimal but adequate contrast bolus. Three vessel arch configuration with no arch atherosclerosis. Right carotid system: Negative right CCA and right carotid bifurcation. Tortuous but otherwise negative cervical right ICA. Left carotid system: Negative left CCA and left carotid bifurcation. Negative cervical left ICA. Vertebral arteries: The proximal right subclavian artery and cervical right vertebral artery appear normal. The right vertebral is dominant. Normal proximal left subclavian artery. The non dominant left vertebral artery is diminutive but remains patent to the cisterna magna. CTA HEAD Posterior circulation: The non dominant left vertebral artery seems to functionally terminates in PICA and/or an anterior spinal artery. The right vertebral artery supplies the basilar. No right vertebral or basilar artery stenosis identified. But both vessels are somewhat diminutive. Both posterior communicating arteries are present. Patent SCA origins. Overall fetal type PCAs. Bilateral PCA branches are grossly normal. Anterior circulation: Both ICA siphons are patent without plaque or stenosis. Normal posterior communicating artery origins. Patent carotid termini.  Normal MCA and ACA origins. Bilateral ACA branches are within normal limits. Right MCA M1 and bifurcation are patent without stenosis. Right MCA branches are within normal limits. The left MCA M1 is patent and gives off an anterior temporal artery on series 9, image 118. Then at the expected location of the left MCA bifurcation or  trifurcation the vessel is occluded (series 11, image 20, series 5, image 57). There is reconstitution of the dominant posterior division branch. Venous sinuses: Early contrast timing, not evaluated. Anatomic variants: Dominant right vertebral artery, the left appears to terminates in PICA and/or the anterior spinal artery. Fetal type PCA origins. Review of the MIP images confirms the above findings IMPRESSION: 1. Positive for emergent large vessel occlusion at the Left MCA bifurcation. There is reconstitution of the dominant posterior division branch. This preliminary result was communicated to Dr. Leonel Ramsay at 0413 hours, and then we discussed by telephone. 2. Elsewhere negative CTA head and neck. Dominant right vertebral artery which supplies the basilar. Fetal type PCA origins. Electronically Signed   By: Genevie Ann M.D.   On: 05/09/2019 04:22   US Abdomen Complete  Result Date: 04/23/2019 CLINICAL DATA:  34 year old male with elevated LFTs EXAM: ABDOMEN ULTRASOUND COMPLETE COMPARISON:  Hepatic liver Doppler examination performed concurrently. FINDINGS: Gallbladder: No gallstones or wall thickening visualized. No sonographic Murphy sign noted by sonographer. Common bile duct: Diameter: Normal at 3 mm Liver: The liver is diffusely heterogeneous and predominantly echogenic. Additionally, there is coarsening of the echotexture. No discrete lesion identified. Portal vein is patent on color Doppler imaging with normal direction of blood flow towards the liver. IVC: No abnormality visualized. Pancreas: Not well seen due to overlying bowel gas. Spleen: Size and appearance within normal limits. Right Kidney: Length: 12.3 cm. Echogenicity within normal limits. No mass or hydronephrosis visualized. Left Kidney: Length: 12.5 cm. Echogenicity within normal limits. No mass or hydronephrosis visualized. Abdominal aorta: No aneurysm visualized. Other findings: None. IMPRESSION: 1. Diffusely heterogeneous and echogenic  hepatic parenchyma. While nonspecific, the imaging appearance is most suggestive of hepatic steatosis. 2. No evidence of cholelithiasis or biliary ductal dilatation. Electronically Signed   By: Jacqulynn Cadet M.D.   On: 04/23/2019 14:54   CT CEREBRAL PERFUSION W CONTRAST  Result Date: 05/14/2019 CLINICAL DATA:  34 year old male code stroke presentation with left MCA bifurcation occlusion on CTA. EXAM: CT PERFUSION BRAIN TECHNIQUE: Multiphase CT imaging of the brain was performed following IV bolus contrast injection. Subsequent parametric perfusion maps were calculated using RAPID software. CONTRAST:  38mL OMNIPAQUE IOHEXOL 350 MG/ML SOLN COMPARISON:  CTA head and neck, plain CT earlier today. FINDINGS: CT Brain Perfusion Findings: CBF (<30%) Volume: 0 mL. There is a small 6-8 mL volume of less stringent and CBF <38% detected. Perfusion (Tmax>6.0s) volume: 60 mL. Hypoperfusion index 0.5 with 32 mL of T-max greater than 10s detected. Mismatch Volume: 60 mL ASPECTS on noncontrast CT Head: 10 at 0402 hours today. Infarction Location:Left frontal operculum, deep white matter. IMPRESSION: 1. CTP detects evidence of Left MCA territory oligemia concordant with the CTA findings. 2. No infarct core detected with standard CBF <30%. 3. 60 mL of penumbra detected with standard T-max > 6s. Electronically Signed   By: Genevie Ann M.D.   On: 04/28/2019 05:06   DG Chest Port 1 View  Result Date: 04/23/2019 CLINICAL DATA:  Central line placement EXAM: PORTABLE CHEST 1 VIEW COMPARISON:  04/23/2019 FINDINGS: Interval placement of a right neck vascular sheath, tip positioned over the right brachiocephalic  vein. Left neck vascular catheter remains in position, tip over the mid SVC. Endotracheal tube and esophagogastric tube in unchanged position. Slight interval increase in diffuse bilateral interstitial and heterogeneous airspace opacity. Cardiomegaly. IMPRESSION: 1. Interval placement of a right neck vascular sheath, tip  positioned over the right brachiocephalic vein. 2.  Other support apparatus in unchanged position. 3. Slight interval increase in diffuse bilateral interstitial and heterogeneous airspace opacity, consistent with multifocal infection, edema, and/or ARDS. 4.  Cardiomegaly. Electronically Signed   By: Eddie Candle M.D.   On: 04/23/2019 16:36   DG Chest Port 1 View  Result Date: 04/23/2019 CLINICAL DATA:  Fever EXAM: PORTABLE CHEST 1 VIEW COMPARISON:  04/21/2019 FINDINGS: An endotracheal tube with tip 4 cm above the carina, NG tube entering the stomach with tip off the field of view and LEFT IJ central venous catheter with tip overlying the mid SVC again noted. Cardiomegaly again identified. Increasing patchy bilateral airspace opacities noted. No definite pleural effusion or pneumothorax. IMPRESSION: Increasing patchy bilateral airspace opacities which may represent edema or infection. Electronically Signed   By: Margarette Canada M.D.   On: 04/23/2019 07:57   DG CHEST PORT 1 VIEW  Result Date: 04/24/2019 CLINICAL DATA:  Central line placement EXAM: PORTABLE CHEST 1 VIEW COMPARISON:  05/14/2019, 8:16 a.m. FINDINGS: Interval placement of like neck vascular catheter, tip positioned near the superior cavoatrial junction. Interval retraction of endotracheal tube, now positioned just above the thoracic inlet, approximately 6.5 cm above the carina. Interval placement of esophagogastric tube, tip and side port below the diaphragm. Unchanged cardiomegaly and mild, diffuse bilateral interstitial airspace opacity. IMPRESSION: 1. Interval placement of neck vascular catheter, tip positioned near the superior cavoatrial junction. 2. Interval retraction of endotracheal tube, now positioned just above the thoracic inlet, approximately 6.5 cm above the carina. 3. Interval placement of esophagogastric tube, tip and side port below the diaphragm. 4. Stable cardiomegaly and diffuse bilateral interstitial airspace opacity, likely edema.  Electronically Signed   By: Eddie Candle M.D.   On: 05/12/2019 19:58   DG Chest Port 1 View  Result Date: 04/26/2019 CLINICAL DATA:  Intubation. EXAM: PORTABLE CHEST 1 VIEW COMPARISON:  Chest x-ray 12/13/2018. FINDINGS: Endotracheal tube tip noted just above the carina. Proximal repositioning of 2-3 cm should be considered. Stable cardiomegaly. Diffuse bilateral pulmonary infiltrates/edema. Small left pleural effusion. No pneumothorax. IMPRESSION: 1. Endotracheal tube tip noted just above the carina. Proximal repositioning of 2-3 should be considered. 2. Cardiomegaly again noted. Diffuse bilateral pulmonary infiltrates/edema. Small left pleural effusion. These results will be called to the ordering clinician or representative by the Radiologist Assistant, and communication documented in the PACS or zVision Dashboard. Electronically Signed   By: Marcello Moores  Register   On: 04/28/2019 08:41   DG Abd Portable 1V  Result Date: 05/06/2019 CLINICAL DATA:  OG tube placement. EXAM: PORTABLE ABDOMEN - 1 VIEW COMPARISON:  None FINDINGS: OG tube tip is in the distal stomach. Bowel gas pattern is normal. Contrast seen in the renal collecting systems bilaterally. No bone abnormality. IMPRESSION: OG tube tip in the distal stomach. Electronically Signed   By: Lorriane Shire M.D.   On: 04/20/2019 10:07   IR PATIENT EVAL TECH 0-60 MINS  Result Date: 04/23/2019 Darnelle Spangle     04/23/2019 10:29 AM 45fr sheath pulled from right groin using manual pressure and quikclot hemostasis gauze at 0938 hrs.  Hemostasis achieved at 1003 hrs.  Groin reviewed with patient's RN Anderson Malta.  PT pulse on right side is  dopplerable.  Unable to palpate or doppler DP pulse.  RN Anderson Malta aware of pedal pulse situation on right side.  Quikclot hemostasis gauze needs to be removed no later than 1000 hrs 04/24/2019.  ECHOCARDIOGRAM COMPLETE  Result Date: 05/06/2019   ECHOCARDIOGRAM REPORT   Patient Name:   Carlos Brewer. Date of Exam:  05/02/2019 Medical Rec #:  [id number]                 Height:       73.0 in Accession #:    [id number]                Weight:       273.8 lb Date of Birth:  05/19/1985                 BSA:          2.46 m Patient Age:    33 years                  BP:           121/95 mmHg Patient Gender: M                         HR:           87 bpm. Exam Location:  Inpatient Procedure: 2D Echo Indications:    stroke  History:        Patient has prior history of Echocardiogram examinations, most                 recent 07/13/2018. Chronic kidney disease; Risk Factors:Sleep                 Apnea and substance abuse.  Sonographer:    Johny Chess Referring Phys: [id number] [place] KIRKPATRICK  Sonographer Comments: Echo performed with patient supine and on artificial respirator. IMPRESSIONS  1. Left ventricular ejection fraction, by visual estimation, is <20%. The left ventricle has severely decreased function. There is no left ventricular hypertrophy.  2. Apex is not well-visualized. In setting of stroke with severe LV systolic dysfunction, recommend repeat TTE with contrast to rule out LV apical thrombus  3. Severely dilated left ventricular internal cavity size.  4. Left ventricular diastolic parameters are consistent with Grade III diastolic dysfunction (restrictive).  5. Elevated left atrial pressure.  6. The left ventricle demonstrates global hypokinesis.  7. Global right ventricle has normal systolic function.The right ventricular size is mildly enlarged.  8. Left atrial size was moderately dilated.  9. Right atrial size was normal. 10. The mitral valve is normal in structure. Mild mitral valve regurgitation. 11. The tricuspid valve is normal in structure. Tricuspid valve regurgitation is trivial. 12. The aortic valve is normal in structure. Aortic valve regurgitation is not visualized. No evidence of aortic valve sclerosis or stenosis. 13. The pulmonic valve was not well visualized. Pulmonic valve regurgitation is not visualized.  FINDINGS  Left Ventricle: Left ventricular ejection fraction, by visual estimation, is <20%. The left ventricle has severely decreased function. The left ventricle demonstrates global hypokinesis. The left ventricular internal cavity size was severely dilated left ventricle. There is no left ventricular hypertrophy. Left ventricular diastolic parameters are consistent with Grade III diastolic dysfunction (restrictive). Elevated left atrial pressure. Right Ventricle: The right ventricular size is mildly enlarged. No increase in right ventricular wall thickness. Global RV systolic function is has normal systolic function. Left Atrium: Left atrial size was moderately dilated. Right  Atrium: Right atrial size was normal in size Pericardium: There is no evidence of pericardial effusion. Mitral Valve: The mitral valve is normal in structure. Mild mitral valve regurgitation. Tricuspid Valve: The tricuspid valve is normal in structure. Tricuspid valve regurgitation is trivial. Aortic Valve: The aortic valve is normal in structure. Aortic valve regurgitation is not visualized. The aortic valve is structurally normal, with no evidence of sclerosis or stenosis. Pulmonic Valve: The pulmonic valve was not well visualized. Pulmonic valve regurgitation is not visualized. Pulmonic regurgitation is not visualized. Aorta: The aortic root is normal in size and structure. Venous: IVC assessment for right atrial pressure unable to be performed due to mechanical ventilation. IAS/Shunts: No atrial level shunt detected by color flow Doppler.  LEFT VENTRICLE PLAX 2D LVIDd:         7.20 cm       Diastology LVIDs:         6.40 cm       LV e' lateral:   6.53 cm/s LV PW:         1.00 cm       LV E/e' lateral: 12.5 LV IVS:        0.80 cm       LV e' medial:    3.92 cm/s LVOT diam:     2.00 cm       LV E/e' medial:  20.8 LV SV:         64 ml LV SV Index:   24.74 LVOT Area:     3.14 cm  LV Volumes (MOD) LV area d, A4C:    56.30 cm LV area s, A4C:     49.80 cm LV major d, A4C:   10.20 cm LV major s, A4C:   10.00 cm LV vol d, MOD A4C: 255.0 ml LV vol s, MOD A4C: 208.0 ml LV SV MOD A4C:     255.0 ml RIGHT VENTRICLE RV S prime:     11.20 cm/s TAPSE (M-mode): 1.8 cm LEFT ATRIUM              Index       RIGHT ATRIUM           Index LA diam:        4.30 cm  1.75 cm/m  RA Area:     22.40 cm LA Vol (A2C):   104.0 ml 42.29 ml/m RA Volume:   70.50 ml  28.67 ml/m LA Vol (A4C):   97.1 ml  39.49 ml/m LA Biplane Vol: 104.0 ml 42.29 ml/m  AORTIC VALVE LVOT Vmax:   47.00 cm/s LVOT Vmean:  34.500 cm/s LVOT VTI:    0.070 m  AORTA Ao Root diam: 3.00 cm MITRAL VALVE MV Area (PHT): 4.06 cm            SHUNTS MV PHT:        54.23 msec          Systemic VTI:  0.07 m MV Decel Time: 187 msec            Systemic Diam: 2.00 cm MR PISA:        0.57 cm MR PISA Radius: 0.30 cm MV E velocity: 81.40 cm/s 103 cm/s  Oswaldo Milian MD Electronically signed by Oswaldo Milian MD Signature Date/Time: 05/13/2019/4:40:48 PM    Final    US LIVER DOPPLER  Result Date: 04/23/2019 CLINICAL DATA:  34 year old male currently intubated and ventilated with elevated LFTs. EXAM: DUPLEX ULTRASOUND OF LIVER TECHNIQUE: Color and duplex  Doppler ultrasound was performed to evaluate the hepatic in-flow and out-flow vessels. COMPARISON:  None. Concurrently obtained abdominal ultrasound FINDINGS: Liver: Diffusely heterogeneous hepatic parenchyma which is coarsened in echotexture and predominantly echogenic. Normal hepatic contour without nodularity. Main Portal Vein size: 1 cm Portal Vein Velocities Main Prox:  48 cm/sec with normal hepatopetal flow Main Mid: 48 cm/sec Main Dist:  49 cm/sec Right: 55 cm/sec with normal hepatopetal flow Left: 56 cm/sec with normal hepatopetal flow Hepatic Vein Velocities Right:  44 cm/sec Middle:  132 cm/sec Left:  127 cm/sec IVC: Present and patent with normal respiratory phasicity. Hepatic Artery Velocity:  185 cm/sec Splenic Vein Velocity:  80 cm/sec Spleen:  5.9 x 4.4 x 10.1 cm with a total volume of 136 cm^3 (411 cm^3 is upper limit normal) Portal Vein Occlusion/Thrombus: No Splenic Vein Occlusion/Thrombus: No Ascites: None Varices: None IMPRESSION: 1. Widely patent portal veins with normal hepatopetal directional flow. 2. No evidence of hepatic venous thrombosis. Electronically Signed   By: Jacqulynn Cadet M.D.   On: 04/23/2019 15:03   CT HEAD CODE STROKE WO CONTRAST  Result Date: 04/29/2019 CLINICAL DATA:  Code stroke.  34 year old male EXAM: CT HEAD WITHOUT CONTRAST TECHNIQUE: Contiguous axial images were obtained from the base of the skull through the vertex without intravenous contrast. COMPARISON:  Head CT 08/28/2018. FINDINGS: Brain: No midline shift, mass effect, or evidence of intracranial mass lesion. No ventriculomegaly. No acute intracranial hemorrhage identified. Evidence of chronic cortical encephalomalacia in the right frontal operculum (series 3, image 23). No superimposed acute cortically based infarct identified. Vascular: Suspicious asymmetric hyperdensity of a left MCA branch in the sylvian fissure on series 3, image 16. Skull: Stable, negative. Sinuses/Orbits: Visualized paranasal sinuses and mastoids are stable and well pneumatized. Other: No acute orbit or scalp soft tissue findings. ASPECTS [facility] Stroke Program Early CT Score) Total score (0-10 with 10 being normal): 10 (left MCA territory, chronic cortical encephalomalacia right frontal operculum). IMPRESSION: 1. Asymmetric hyperdensity of a Left MCA branch in the Sylvian fissure suspicious for emergent large vessel occlusion in the setting. 2. No associated acute cortically based infarct, ASPECTS 10. No acute hemorrhage. 3. Chronic right frontal operculum encephalomalacia. 4. These results were communicated to Dr. Leonel Ramsay at 4:06 am on 04/30/2019 by text page via the [facility] messaging system. Electronically Signed   By: Genevie Ann M.D.   On: 05/07/2019 04:10    Labs:  CBC: Recent  Labs    05/04/2019 0355 05/02/2019 0430 04/23/19 0208 04/23/19 0208 04/23/19 0945 04/23/19 1022 04/23/19 2249 04/24/19 [id number] 04/24/19 0843 04/24/19 0911  WBC 7.2  --  21.9*  --  16.2*  --   --   --  17.6*  --   HGB 14.9   < > 15.4   < > 14.6   < > 13.6 13.6 13.7 13.6  HCT 42.8   < > 45.2   < > 44.0   < > 40.0 40.0 39.8 40.0  PLT 243  --  171  --  145*  --   --   --  137*  --    < > = values in this interval not displayed.    COAGS: Recent Labs    08/27/18 2342 05/02/2019 0355 04/23/19 0945 04/24/19 0843  INR 0.9 1.0 3.1* 1.8*  APTT  --  24  --   --     BMP: Recent Labs    05/08/2019 0355 04/21/2019 0355 04/23/2019 0430 05/14/2019 [id number] 04/23/19 0002 04/23/19 [id number] 04/23/19 0945 04/23/19  1022 04/23/19 2249 04/24/19 0208 04/24/19 0435 04/24/19 0614 04/24/19 0633 04/24/19 0911  NA 136   < > 136   < > 139  --  140   < > 139  --  138  --  136 137  K 3.4*   < > 3.4*   < > 5.0   < > 4.6   < > 4.9   < > 5.7* 5.4* 5.3* 5.3*  CL 98   < > 102  --  109  --  109  --   --   --  98  --   --   --   CO2 24  --   --   --  15*  --  13*  --   --   --  20*  --   --   --   GLUCOSE 153*   < > 149*  --  220*  --  229*  --   --   --  276*  --   --   --   BUN 17   < > 17  --  23*  --  28*  --   --   --  29*  --   --   --   CALCIUM 9.5  --   --   --  7.7*  --  7.5*  --   --   --  7.1*  --   --   --   CREATININE 1.17   < > 1.10  --  2.64*  --  4.21*  --   --   --  5.17*  --   --   --   GFRNONAA >60  --   --   --  30*  --  17*  --   --   --  13*  --   --   --   GFRAA >60  --   --   --  35*  --  20*  --   --   --  16*  --   --   --    < > = values in this interval not displayed.    LIVER FUNCTION TESTS: Recent Labs    12/18/18 0427 12/18/18 0427 05/10/2019 0355 04/23/19 0002 04/23/19 0945 04/24/19 0435  BILITOT 0.4  --  0.5 1.6* 1.3*  --   AST 23  --  21 >10,000* >10,000*  --   ALT 32  --  23 11,085* 10,132*  --   ALKPHOS 82  --  95 88 97  --   PROT 6.8  --  6.6 6.1* 5.6*  --   ALBUMIN 3.6    < > 3.4* 2.9* 2.8* 3.0*   < > = values in this interval not displayed.    Assessment and Plan:  Acute CVA s/p cerebral arteriogram with emergent mechanical thrombectomy of left MCA superior division mid M2 segment achieving a TICI3revascularization 05/15/2019 by Dr. Estanislado Pandy. Patient's condition stable- remains intubated/sedated, no movements of extremities while sedated. Right groin incision stable, distal pulses palpable bilaterally with Doppler. On CRRT. Further plans per neurology/CCM/cardiology- appreciate and agree with management. Please call NIR with questions/concerns.   Electronically Signed: Earley Abide, PA-C 04/24/2019, 9:55 AM   I spent a total of 25 Minutes at the the patient's bedside AND on the patient's [facility] floor or unit, greater than 50% of which was counseling/coordinating care for left MCA superior division mid M2 segment occlusion s/p revascularization.

## 2019-04-24 NOTE — Progress Notes (Signed)
[facility] NEPHROLOGY PROGRESS NOTE  Assessment/ Plan: Pt is a 34 y.o. yo male  with morbid Brewer, Carlos Brewer, Carlos Brewer, Carlos Brewer, Carlos with acute MCA stroke who was treated with TPA and IR thrombectomy.  Patient developed shock which is possibly due to cardiogenic and septic and was a started on pressors.  Consulted for AKI.  #Acute kidney injury likely ischemic ATN due to shock likely both cardiogenic and septic concomitant with the use of contrast: UA with UTI, ultrasound with unremarkable kidneys. -Refractory to IV diuretics therefore started  CRRT on 2/6, pre and post filter with sodium bicarbonate because of acidosis.  Today the CO2 level has improved to 27 therefore changing to PrismaSol. -Grossly volume overloaded therefore try to UF 100-150 cc an hour as tolerated by BP.  # Shock: cardiogenic vs septic.  On pressors and broad-spectrum antibiotics.  Monitor map.  PCCM following.  #Hyperkalemia: Managed medically.  Adjust dialysate fluid.  #Metabolic acidosis: Initially dialyzed with sodium bicarbonate.  Improving.  Now patient has mostly respiratory acidosis.  #CHF exacerbation/fluid overload: Attempt to diurese as above.  Cardiology is following.  #Acute stroke status post TPA and IR thrombectomy.  Neurology is on board.  Subjective: Seen and examined at bedside.  Patient is sedated intubated and tolerating CRRT well.  Patient's mother at bedside. Objective Vital signs in last 24 hours: Vitals:   04/24/19 1115 04/24/19 1130 04/24/19 1145 04/24/19 1200  BP:  109/70  107/66  Pulse: 78 76 76 77  Resp: (!) 28 (!) 32 (!) 32 (!) 32  Temp: 98.1 F (36.7 C) 98.1 F (36.7 C) 98.1 F (36.7 C) 98.1 F (36.7 C)  TempSrc:      SpO2: 100% 100% 100% 100%  Weight:      Height:       Weight change: 8.8 kg  Intake/Output Summary (Last 24 hours) at 04/24/2019 1225 Last data filed at 04/24/2019 1200 Gross per 24 hour  Intake 3851.93  ml  Output 4163 ml  Net -311.07 ml       Labs: Basic Metabolic Panel: Recent Labs  Lab 04/23/19 0945 04/23/19 1022 04/24/19 0435 04/24/19 0614 04/24/19 0633 04/24/19 0843 04/24/19 0911  NA 140   < > 138   < > 136 139 137  K 4.6   < > 5.7*   < > 5.3* 5.3* 5.3*  CL 109  --  98  --   --  98  --   CO2 13*  --  20*  --   --  27  --   GLUCOSE 229*  --  276*  --   --  206*  --   BUN 28*  --  29*  --   --  28*  --   CREATININE 4.21*  --  5.17*  --   --  4.77*  --   CALCIUM 7.5*  --  7.1*  --   --  7.1*  --   PHOS  --   --  4.9*  --   --   --   --    < > = values in this interval not displayed.   Liver Function Tests: Recent Labs  Lab 04/23/19 0002 04/23/19 0002 04/23/19 0945 04/24/19 0435 04/24/19 0843  AST >10,000*  --  >10,000*  --  >10,000*  ALT 11,085*  --  10,132*  --  7,495*  ALKPHOS 88  --  97  --  101  BILITOT  1.6*  --  1.3*  --  2.6*  PROT 6.1*  --  5.6*  --  6.1*  ALBUMIN 2.9*   < > 2.8* 3.0* 2.9*   < > = values in this interval not displayed.   Recent Labs  Lab 04/23/19 1656  LIPASE 85*   Recent Labs  Lab 04/23/19 1122  AMMONIA 68*   CBC: Recent Labs  Lab 05/03/2019 0355 04/21/2019 0430 04/23/19 0208 04/23/19 0208 04/23/19 0945 04/23/19 1022 04/24/19 0633 04/24/19 0843 04/24/19 0911  WBC 7.2   < > 21.9*  --  16.2*  --   --  17.6*  --   NEUTROABS 4.3   < > 19.2*  --  13.5*  --   --  15.8*  --   HGB 14.9   < > 15.4   < > 14.6   < > 13.6 13.7 13.6  HCT 42.8   < > 45.2   < > 44.0   < > 40.0 39.8 40.0  MCV 89.0  --  90.8  --  92.6  --   --  92.3  --   PLT 243   < > 171  --  145*  --   --  137*  --    < > = values in this interval not displayed.   Cardiac Enzymes: Recent Labs  Lab 04/23/19 1356  CKTOTAL 602*   CBG: Recent Labs  Lab 04/23/19 1703 04/23/19 1948 04/23/19 2326 04/24/19 0326 04/24/19 0821  GLUCAP 259* 243* 283* 233* 213*    Iron Studies: No results for input(s): IRON, TIBC, TRANSFERRIN, FERRITIN in the last 72  hours. Studies/Results: CT HEAD WO CONTRAST  Result Date: 04/23/2019 CLINICAL DATA:  Followup stroke.  Left MCA occlusion. EXAM: CT HEAD WITHOUT CONTRAST TECHNIQUE: Contiguous axial images were obtained from the base of the skull through the vertex without intravenous contrast. COMPARISON:  CT studies done yesterday. FINDINGS: Brain: No abnormality is seen affecting the brainstem or cerebellum. I think there is some artifactual low-density in the posterior fossa. Cannot completely rule out cerebellar infarctions but think that would be unlikely given the previous studies. Right hemisphere remains normal. I think there is a small area gray-white differentiation loss in the left frontal operculum. No hemorrhage or swelling. Vascular: No abnormal vascular finding. Skull: Negative Sinuses/Orbits: Clear/normal Other: None IMPRESSION: Small region of loss of gray-white differentiation in the left frontal operculum consistent with a fairly small area of acute infarction compared to the previous perfusion abnormality. No hemorrhage or mass effect. Areas of low-density in the cerebellum that I favor are artifactual. There did not seem to be any abnormalities in that region on the previous examinations. Electronically Signed   By: Nelson Chimes M.D.   On: 04/23/2019 05:01   US Abdomen Complete  Result Date: 04/23/2019 CLINICAL DATA:  34 year old male with elevated LFTs EXAM: ABDOMEN ULTRASOUND COMPLETE COMPARISON:  Hepatic liver Doppler examination performed concurrently. FINDINGS: Gallbladder: No gallstones or wall thickening visualized. No sonographic Murphy sign noted by sonographer. Common bile duct: Diameter: Normal at 3 mm Liver: The liver is diffusely heterogeneous and predominantly echogenic. Additionally, there is coarsening of the echotexture. No discrete lesion identified. Portal vein is patent on color Doppler imaging with normal direction of blood flow towards the liver. IVC: No abnormality visualized.  Pancreas: Not well seen due to overlying bowel gas. Spleen: Size and appearance within normal limits. Right Kidney: Length: 12.3 cm. Echogenicity within normal limits. No mass or hydronephrosis visualized. Left Kidney:  Length: 12.5 cm. Echogenicity within normal limits. No mass or hydronephrosis visualized. Abdominal aorta: No aneurysm visualized. Other findings: None. IMPRESSION: 1. Diffusely heterogeneous and echogenic hepatic parenchyma. While nonspecific, the imaging appearance is most suggestive of hepatic steatosis. 2. No evidence of cholelithiasis or biliary ductal dilatation. Electronically Signed   By: Jacqulynn Cadet M.D.   On: 04/23/2019 14:54   DG Chest Port 1 View  Result Date: 04/23/2019 CLINICAL DATA:  Central line placement EXAM: PORTABLE CHEST 1 VIEW COMPARISON:  04/23/2019 FINDINGS: Interval placement of a right neck vascular sheath, tip positioned over the right brachiocephalic vein. Left neck vascular catheter remains in position, tip over the mid SVC. Endotracheal tube and esophagogastric tube in unchanged position. Slight interval increase in diffuse bilateral interstitial and heterogeneous airspace opacity. Cardiomegaly. IMPRESSION: 1. Interval placement of a right neck vascular sheath, tip positioned over the right brachiocephalic vein. 2.  Other support apparatus in unchanged position. 3. Slight interval increase in diffuse bilateral interstitial and heterogeneous airspace opacity, consistent with multifocal infection, edema, and/or ARDS. 4.  Cardiomegaly. Electronically Signed   By: Eddie Candle M.D.   On: 04/23/2019 16:36   DG Chest Port 1 View  Result Date: 04/23/2019 CLINICAL DATA:  Fever EXAM: PORTABLE CHEST 1 VIEW COMPARISON:  05/12/2019 FINDINGS: An endotracheal tube with tip 4 cm above the carina, NG tube entering the stomach with tip off the field of view and LEFT IJ central venous catheter with tip overlying the mid SVC again noted. Cardiomegaly again identified. Increasing  patchy bilateral airspace opacities noted. No definite pleural effusion or pneumothorax. IMPRESSION: Increasing patchy bilateral airspace opacities which may represent edema or infection. Electronically Signed   By: Margarette Canada M.D.   On: 04/23/2019 07:57   DG CHEST PORT 1 VIEW  Result Date: 05/05/2019 CLINICAL DATA:  Central line placement EXAM: PORTABLE CHEST 1 VIEW COMPARISON:  05/15/2019, 8:16 a.m. FINDINGS: Interval placement of like neck vascular catheter, tip positioned near the superior cavoatrial junction. Interval retraction of endotracheal tube, now positioned just above the thoracic inlet, approximately 6.5 cm above the carina. Interval placement of esophagogastric tube, tip and side port below the diaphragm. Unchanged cardiomegaly and mild, diffuse bilateral interstitial airspace opacity. IMPRESSION: 1. Interval placement of neck vascular catheter, tip positioned near the superior cavoatrial junction. 2. Interval retraction of endotracheal tube, now positioned just above the thoracic inlet, approximately 6.5 cm above the carina. 3. Interval placement of esophagogastric tube, tip and side port below the diaphragm. 4. Stable cardiomegaly and diffuse bilateral interstitial airspace opacity, likely edema. Electronically Signed   By: Eddie Candle M.D.   On: 05/02/2019 19:58   IR PATIENT EVAL TECH 0-60 MINS  Result Date: 04/23/2019 Darnelle Spangle     04/23/2019 10:29 AM 50f sheath pulled from right groin using manual pressure and quikclot hemostasis gauze at 0938 hrs.  Hemostasis achieved at 1003 hrs.  Groin reviewed with patient's RN JAnderson Malta  PT pulse on right side is dopplerable.  Unable to palpate or doppler DP pulse.  RN JAnderson Maltaaware of pedal pulse situation on right side.  Quikclot hemostasis gauze needs to be removed no later than 1000 hrs 04/24/2019.  [place]PL[place]  Result Date: 04/23/2019 CLINICAL DATA:  34year old male currently intubated and ventilated with elevated LFTs. EXAM:  DUPLEX ULTRASOUND OF LIVER TECHNIQUE: Color and duplex Doppler ultrasound was performed to evaluate the hepatic in-flow and out-flow vessels. COMPARISON:  None. Concurrently obtained abdominal ultrasound FINDINGS: Liver: Diffusely heterogeneous hepatic parenchyma which is  coarsened in echotexture and predominantly echogenic. Normal hepatic contour without nodularity. Main Portal Vein size: 1 cm Portal Vein Velocities Main Prox:  48 cm/sec with normal hepatopetal flow Main Mid: 48 cm/sec Main Dist:  49 cm/sec Right: 55 cm/sec with normal hepatopetal flow Left: 56 cm/sec with normal hepatopetal flow Hepatic Vein Velocities Right:  44 cm/sec Middle:  132 cm/sec Left:  127 cm/sec IVC: Present and patent with normal respiratory phasicity. Hepatic Artery Velocity:  185 cm/sec Splenic Vein Velocity:  80 cm/sec Spleen: 5.9 x 4.4 x 10.1 cm with a total volume of 136 cm^3 (411 cm^3 is upper limit normal) Portal Vein Occlusion/Thrombus: No Splenic Vein Occlusion/Thrombus: No Ascites: None Varices: None IMPRESSION: 1. Widely patent portal veins with normal hepatopetal directional flow. 2. No evidence of hepatic venous thrombosis. Electronically Signed   By: Jacqulynn Cadet M.D.   On: 04/23/2019 15:03    Medications: Infusions: . sodium chloride    . sodium chloride Stopped (04/24/19 0832)  . dexmedetomidine (PRECEDEX) IV infusion Stopped (04/23/19 0758)  . epinephrine Stopped (04/24/19 0604)  . fentaNYL infusion INTRAVENOUS Stopped (04/24/19 0738)  . meropenem (MERREM) IV Stopped (04/24/19 0540)  . midazolam    . norepinephrine (LEVOPHED) Adult infusion 20 mcg/min (04/24/19 1200)  . prismasol BGK 4/2.5 2,000 mL/hr at 04/24/19 1028  . sodium bicarbonate (isotonic) 1000 mL infusion 150 mL/hr at 04/24/19 0840  . sodium bicarbonate (isotonic) 1000 mL infusion 150 mL/hr at 04/24/19 0956  . vancomycin Stopped (04/24/19 1133)  . vasopressin (PITRESSIN) infusion - *FOR SHOCK* 0.03 Units/min (04/24/19 1200)     Scheduled Medications: . sodium chloride   Intravenous Once  . chlorhexidine gluconate (MEDLINE KIT)  15 mL Mouth Rinse BID  . Chlorhexidine Gluconate Cloth  6 each Topical Q0600  . fentaNYL (SUBLIMAZE) injection  50 mcg Intravenous Once  . FLUoxetine  20 mg Per Tube Daily  . hydrocortisone sod succinate (SOLU-CORTEF) inj  50 mg Intravenous Q6H  . insulin aspart  0-20 Units Subcutaneous Q4H  . mouth rinse  15 mL Mouth Rinse 10 times per day  . mupirocin ointment  1 application Nasal BID  . pantoprazole (PROTONIX) IV  40 mg Intravenous Daily  . sodium chloride flush  10-40 mL Intracatheter Q12H    have reviewed scheduled and prn medications.  Physical Exam: General: Sedated, intubated Heart:RRR, s1s2 nl Lungs: Coarse breath sound bilateral b/l, no wheezing Abdomen:soft, Non-tender, non-distended Extremities: Anasarca Dialysis Access: Right IJ temporary dialysis catheter placed on 2/6 by ICU.  Betsaida [place] Tanna Furry 04/24/2019,12:25 PM  LOS: 2 days  Pager: [phone]

## 2019-04-24 NOTE — Progress Notes (Addendum)
At 1621, this nurse entered the room upon seeing the ART line reading 67/45 (51) and HR 128. Levo and vasopressin gtts titrated, cuff pressure (70/52 (59)) and changed the CRRT machine to pull even (instead of 150 ml/hr). Dr. Duwayne Heck notified  Dr. Duwayne Heck gave verbal orders to resume sedation and change the ventilator RR from 28 to 32.   50 mcg Fentanyl bolus given prior to starting the gtt.   @1624  ART 118/65 (76), Cuff 104/63 (73) and HR immediately converted to rate of 62.  Upon review of ECG history, the episode starting with a 3-beat run of non-sustained runs of VTach. This was recorded to the echart.

## 2019-04-24 NOTE — Progress Notes (Addendum)
 NAME:  Carlos Alvin Basu Jr., MRN:  [id number], DOB:  04/17/1985, LOS: 2 ADMISSION DATE:  04/27/2019, CONSULTATION DATE:  04/19/2019 REFERRING MD:  Dr. Kirkpatrick, CHIEF COMPLAINT:  Slurred speech  Brief History   33 yo male smoker found to have slurred speech and Rt sided weakness.  CT head showed M2 occlusion.  Ultimately treated with thrombolytic and thrombectomy by IR.  Required intubation for airway protection.  Early AM 2/5  Progressive aphasia, R Drift and droop   Hx of CHF (EF 15%)  Hx myocarditis Smoker 1/2 ppd  OSA    Past Medical History  Systolic CHF with non ischemic CM, Cocaine abuse, OSA, DM  Significant Hospital Events   2/05 Admit, tPA, IR thrombectomy  Consults:  Neuro IR  Procedures:  ETT 2/05 >>   Significant Diagnostic Tests:  CT angio head/neck 2/05 >> occlusion of Lt MCA bifurcation Echo 2/05 >>   Micro Data:  SARS CoV2 PCR 2/05 >> negative Influenza PCR 2/05 >> negative  Antimicrobials:    Interim history/subjective:  Hospital course summary:   2/5-2/6:  TPA given at 428 am (total of 90 Mg) 520 am went to IR  S/P Lt common carotid arteriogram followed by complete revascularization of occluded LT MCA sup division mid M2 seg with x 1 pass with 4mmx 40 mm solitaire X ret river device and penumbra aspiration with TICI 3 revascularization  Post procedure CT brain neg for ICH or mass effect. Patient left intubated with Rt groin 8F sheath to arterial flush. Distal pulses DPs and PTs dopplerable bilaterally.  4pm trying to switch to ps.  6pm getting  Central line for hypotension.  Phenylephrine had been titrated up over course of day.  Precedex had been started in AM.   10pm getting hyperkalemia cocktail  Increasing O2 needs and hypotension at 2000,  Fever 2300 100% on fvent at 2300 Overnight requiring increasing levophed and phenylephrine.    Tylenol x 4  Versed pushes x 5   2/6: switched to levophed, epi, started antibiotics,  started on CRRT.    Objective   Blood pressure 102/67, pulse 82, temperature (!) 97.3 F (36.3 C), resp. rate (!) 31, height 6' 1" (1.854 m), weight 133 kg, SpO2 100 %. CVP:  [9 mmHg-21 mmHg] 14 mmHg  Vent Mode: SIMV/PC/PS FiO2 (%):  [100 %] 100 % Set Rate:  [18 bmp-32 bmp] 32 bmp Vt Set:  [630 mL] 630 mL PEEP:  [10 cmH20] 10 cmH20 Pressure Support:  [10 cmH20] 10 cmH20 Plateau Pressure:  [18 cmH20-25 cmH20] 22 cmH20   Intake/Output Summary (Last 24 hours) at 04/24/2019 0746 Last data filed at 04/24/2019 0700 Gross per 24 hour  Intake 3765.96 ml  Output 3068 ml  Net 697.96 ml   Filed Weights   05/05/2019 0737 04/24/19 0500  Weight: 124.2 kg 133 kg    Examination:  General - non responsive, labored breathing on vent,  Eyes - pupils reactive ENT - ETT in place Cardiac - sinus tach, no murmur Chest - no wheeze Abdomen - soft, non tender, + bowel sounds Extremities - no cyanosis, clubbing, or edema Skin - no rashes Neuro - sedated   Resolved Hospital Problem list     Assessment & Plan:  L MCA CVA: s/p tpa 430 AM on 2/5 S/p IR clot removal.  Post procedure had been awake on vent moving all ext.  CT this am showed resolution.  Holding off on ASA for now due to bleeding risk. BP goals now   loosened since >24 hrs after stroke/tpa  Shock, septic:  Septic shock with organ failure (transaminitis and AKI).   Possibly UTI vs PNA.   Blood, sputum and Ucx pending. Foley placed 2/6 am thick, yellow blood tinged urine.  Remains on mero, vanc, stress dose steroids.  Metabolic acidosis, anion gap, lactic acidosis -- improving on CRRT.   Cont Vent, LTV as tolerates.  Fevers have improved.   Encephalopathy:  Minimally responsive today.  Likely metabolic (AKI, shock, transaminitis), combined with residual fentanyl.  Starting to move spontaneously, open eyes to stimulation today.  Cont to monitor.   Acute hypoxemic respiratory failure: B infiltrates, ARDS vs cardiogenic pulm edema.  Oxygenation has improved somewhat since initiated volume removal with CRRT.  LTV ventilation as tolerates.  Currently remains with a respiratory acidosis on 8ml/kg, RR 32 will continue this for now.     Transaminitis 2/2 hypoperfusion (shock liver) Appreciate GI consult.  Caution with acetaminophen if needed for fever.  INR improved today (FFP x 2 yesterday)  AKI;  Still minimal UOP>  Monitor electrolytes.   Hypocalcemia: monitor, replete if needed.  Giving 1 dose CaGluc today for persistent hypocalcemia.    Hx of cocaine abuse. depression. - not active per UDS  - continue prozac  Non ischemic CM with chronic systolic CHF. Hx of HTN, HLD. Dx 2018 - followed by [facility] in [place] - continue lipitor, digoxin - resume ASA when okay with neurology - hold outpt coreg, zaroxolyn, entresto, aldactone, torsemide for now Cardiology consulting Holding home digoxin.   DM type II poorly controlled. - SSI -some hyperglycemia today, monitor closely.    Best practice:  Diet: NPO, hold off on tube feeds today, still very unstable.  DVT prophylaxis: SCDs GI prophylaxis: protonix Mobility: bed rest Code Status: full code Disposition: ICU Discussed with mother at bedside.  Improved slightly today but still critically ill with multiple organ dysfunction. .Plan for aggressive care, esp given his young age.  He has many supportive family and also has young children.   Labs   CBC: Recent Labs  Lab 05/05/2019 0355 05/02/2019 0430 04/23/19 0208 04/23/19 0208 04/23/19 0945 04/23/19 1022 04/23/19 1237 04/23/19 2249 04/24/19 0633  WBC 7.2  --  21.9*  --  16.2*  --   --   --   --   NEUTROABS 4.3  --  19.2*  --  13.5*  --   --   --   --   HGB 14.9   < > 15.4   < > 14.6 13.9 13.9 13.6 13.6  HCT 42.8   < > 45.2   < > 44.0 41.0 41.0 40.0 40.0  MCV 89.0  --  90.8  --  92.6  --   --   --   --   PLT 243  --  171  --  145*  --   --   --   --    < > = values in this interval not  displayed.    Basic Metabolic Panel: Recent Labs  Lab  0000 05/07/2019 0355 05/02/2019 0355 04/25/2019 0430 05/11/2019 0905 04/21/2019 2123 05/04/2019 2208 04/23/19 0002 04/23/19 0208 04/23/19 0608 04/23/19 0945 04/23/19 0945 04/23/19 1022 04/23/19 1122 04/23/19 1237 04/23/19 1356 04/23/19 2249 04/24/19 0208 04/24/19 0435 04/24/19 0614 04/24/19 0633  NA   < > 136   < > 136   < >  --    < > 139  --   --  140   < >   140  --  139  --  139  --  138  --  136  K  --  3.4*   < > 3.4*   < > 7.2*   < > 5.0  --    < > 4.6   < > 4.7   < > 5.0   < > 4.9 5.5* 5.7* 5.4* 5.3*  CL  --  98  --  102  --   --   --  109  --   --  109  --   --   --   --   --   --   --  98  --   --   CO2  --  24  --   --   --   --   --  15*  --   --  13*  --   --   --   --   --   --   --  20*  --   --   GLUCOSE  --  153*  --  149*  --   --   --  220*  --   --  229*  --   --   --   --   --   --   --  276*  --   --   BUN  --  17  --  17  --   --   --  23*  --   --  28*  --   --   --   --   --   --   --  29*  --   --   CREATININE  --  1.17  --  1.10  --   --   --  2.64*  --   --  4.21*  --   --   --   --   --   --   --  5.17*  --   --   CALCIUM  --  9.5  --   --   --   --   --  7.7*  --   --  7.5*  --   --   --   --   --   --   --  7.1*  --   --   MG  --   --   --   --   --  2.1  --   --  1.9  --   --   --   --   --   --   --   --   --  1.9  --   --   PHOS  --   --   --   --   --   --   --   --   --   --   --   --   --   --   --   --   --   --  4.9*  --   --    < > = values in this interval not displayed.   GFR: Estimated Creatinine Clearance: 28.8 mL/min (A) (by C-G formula based on SCr of 5.17 mg/dL (H)). Recent Labs  Lab 04/23/2019 0355 04/23/19 0208 04/23/19 0945 04/23/19 1122 04/24/19 0435  PROCALCITON  --   --  8.21  --  26.87  WBC 7.2 21.9* 16.2*  --   --   LATICACIDVEN  --   --  6.5* 6.8*  --       Liver Function Tests: Recent Labs  Lab 05/03/2019 0355 04/23/19 0002 04/23/19 0945 04/24/19 0435  AST 21  >10,000* >10,000*  --   ALT 23 11,085* 10,132*  --   ALKPHOS 95 88 97  --   BILITOT 0.5 1.6* 1.3*  --   PROT 6.6 6.1* 5.6*  --   ALBUMIN 3.4* 2.9* 2.8* 3.0*   Recent Labs  Lab 04/23/19 1656  LIPASE 85*   Recent Labs  Lab 04/23/19 1122  AMMONIA 68*    ABG    Component Value Date/Time   PHART 7.261 (L) 04/24/2019 0633   PCO2ART 55.0 (H) 04/24/2019 0633   PO2ART 100.0 04/24/2019 0633   HCO3 25.0 04/24/2019 0633   TCO2 27 04/24/2019 0633   ACIDBASEDEF 3.0 (H) 04/24/2019 0633   O2SAT 97.0 04/24/2019 0633     Coagulation Profile: Recent Labs  Lab 04/30/2019 0355 04/23/19 0945  INR 1.0 3.1*    Cardiac Enzymes: Recent Labs  Lab 04/23/19 1356  CKTOTAL 602*    HbA1C: Hgb A1c MFr Bld  Date/Time Value Ref Range Status  04/23/2019 03:27 AM 6.5 (H) 4.8 - 5.6 % Final    Comment:    (NOTE) Pre diabetes:          5.7%-6.4% Diabetes:              >6.4% Glycemic control for   <7.0% adults with diabetes   05/11/2019 03:55 AM 6.5 (H) 4.8 - 5.6 % Final    Comment:    (NOTE) Pre diabetes:          5.7%-6.4% Diabetes:              >6.4% Glycemic control for   <7.0% adults with diabetes     CBG: Recent Labs  Lab 04/23/19 1232 04/23/19 1703 04/23/19 1948 04/23/19 2326 04/24/19 0326  GLUCAP 182* 259* 243* 283* 233*    Review of Systems:   Unable to obtain  Past Medical History  He,  has a past medical history of CHF (congestive heart failure) (HCC), Cocaine abuse (HCC), HFrEF (heart failure with reduced ejection fraction) (HCC), History of cardiac cath, Morbid obesity (HCC), Myocarditis (HCC), NICM (nonischemic cardiomyopathy) (HCC), Palpitations, Sleep apnea, and Tobacco abuse.   Surgical History    Past Surgical History:  Procedure Laterality Date  . CORONARY ANGIOPLASTY    . IR PATIENT EVAL TECH 0-60 MINS  04/23/2019  . NO PAST SURGERIES    . RIGHT/LEFT HEART CATH AND CORONARY ANGIOGRAPHY N/A 10/27/2016   Procedure: RIGHT/LEFT HEART CATH AND CORONARY  ANGIOGRAPHY;  Surgeon: Arida, Muhammad A, MD;  Location: [facility] INVASIVE CV LAB;  Service: Cardiovascular;  Laterality: N/A;  . WISDOM TOOTH EXTRACTION  2008     Social History   reports that he has been smoking cigarettes. He has been smoking about 0.50 packs per day. He has never used smokeless tobacco. He reports current alcohol use. He reports previous drug use. Drug: Cocaine.   Family History   His family history includes Healthy in his mother; Heart failure in his father.   Allergies No Known Allergies   Home Medications  Prior to Admission medications   Medication Sig Start Date End Date Taking? Authorizing Provider  acetaminophen (TYLENOL) 500 MG tablet Take 2 tablets (1,000 mg total) by mouth every 6 (six) hours as needed. Patient not taking: Reported on 08/23/2018 10/12/16   Betancourt, Tina A, NP  albuterol (ACCUNEB) 1.25 MG/3ML nebulizer solution Take 3 mLs by nebulization 4 (four) times daily as   needed for wheezing or shortness of breath. 08/23/18   [provider]  albuterol (VENTOLIN HFA) 108 (90 Base) MCG/ACT inhaler Inhale 1-2 puffs into the lungs every 6 (six) hours as needed for wheezing or shortness of breath. 07/17/18   Xu, Fang, MD  aspirin EC 81 MG EC tablet Take 1 tablet (81 mg total) by mouth daily. 10/30/16   Chen, Qing, MD  atorvastatin (LIPITOR) 40 MG tablet Take 1 tablet (40 mg total) by mouth daily at 6 PM. 08/23/18   Bensimhon, Daniel R, MD  bacitracin ointment Apply 1 application topically 2 (two) times daily. 08/28/18   Browning, Robert, PA-C  carvedilol (COREG) 3.125 MG tablet Take 1 tablet (3.125 mg total) by mouth 2 (two) times daily with a meal. 08/23/18   Bensimhon, Daniel R, MD  digoxin (LANOXIN) 0.125 MG tablet Take 1 tablet (0.125 mg total) by mouth daily. 08/23/18   Bensimhon, Daniel R, MD  FLUoxetine (PROZAC) 20 MG capsule Take 1 capsule (20 mg total) by mouth daily. 12/19/18   Gherghe, Costin M, MD  ibuprofen (ADVIL) 200 MG tablet Take 600 mg by mouth  every 6 (six) hours as needed for moderate pain.    [provider]  insulin aspart (NOVOLOG) 100 UNIT/ML injection Inject 10 Units into the skin 3 (three) times daily before meals.    [provider]  insulin detemir (LEVEMIR) 100 UNIT/ML injection Inject 40 Units into the skin daily.    [provider]  metolazone (ZAROXOLYN) 2.5 MG tablet Take 1 tablet (2.5 mg total) by mouth daily as needed for up to 30 days. Take metolazone 2.5mg x1 only for 5pound weight gain, please take potassium 20meq x1 with metolazone. Patient not taking: Reported on 07/23/2018 07/17/18 08/16/18  Xu, Fang, MD  Multiple Vitamin (MULTI-VITAMIN) tablet Take 1 tablet by mouth daily.    [provider]  pantoprazole (PROTONIX) 40 MG tablet Take 40 mg by mouth daily.    [provider]  potassium chloride SA (K-DUR) 20 MEQ tablet Take 1 tablet (20 mEq total) by mouth daily. Can take extra one if taking metolazone. 08/23/18   Bensimhon, Daniel R, MD  sacubitril-valsartan (ENTRESTO) 24-26 MG Take 1 tablet by mouth 2 (two) times daily. 08/23/18   Bensimhon, Daniel R, MD  spironolactone (ALDACTONE) 25 MG tablet Take 1 tablet (25 mg total) by mouth daily. 08/23/18   Bensimhon, Daniel R, MD  torsemide (DEMADEX) 20 MG tablet Take 2 tablets (40 mg total) by mouth 2 (two) times daily. Patient taking differently: Take 40 mg by mouth daily.  08/23/18   Bensimhon, Daniel R, MD     Critical care time: 75 minutes         MD 

## 2019-04-24 NOTE — Progress Notes (Signed)
Assisted tele visit to patient with family member.  Galo Sayed P, RN  

## 2019-04-24 NOTE — Progress Notes (Signed)
Changed vent mode per MD

## 2019-04-25 ENCOUNTER — Inpatient Hospital Stay (HOSPITAL_COMMUNITY): Payer: No Typology Code available for payment source

## 2019-04-25 DIAGNOSIS — A419 Sepsis, unspecified organism: Secondary | ICD-10-CM

## 2019-04-25 DIAGNOSIS — R6521 Severe sepsis with septic shock: Secondary | ICD-10-CM

## 2019-04-25 DIAGNOSIS — I639 Cerebral infarction, unspecified: Secondary | ICD-10-CM | POA: Diagnosis not present

## 2019-04-25 DIAGNOSIS — I6602 Occlusion and stenosis of left middle cerebral artery: Secondary | ICD-10-CM | POA: Diagnosis not present

## 2019-04-25 DIAGNOSIS — I9589 Other hypotension: Secondary | ICD-10-CM

## 2019-04-25 DIAGNOSIS — E785 Hyperlipidemia, unspecified: Secondary | ICD-10-CM | POA: Diagnosis not present

## 2019-04-25 DIAGNOSIS — I5023 Acute on chronic systolic (congestive) heart failure: Secondary | ICD-10-CM

## 2019-04-25 DIAGNOSIS — R5081 Fever presenting with conditions classified elsewhere: Secondary | ICD-10-CM

## 2019-04-25 DIAGNOSIS — D72829 Elevated white blood cell count, unspecified: Secondary | ICD-10-CM

## 2019-04-25 DIAGNOSIS — J9600 Acute respiratory failure, unspecified whether with hypoxia or hypercapnia: Secondary | ICD-10-CM

## 2019-04-25 DIAGNOSIS — I63312 Cerebral infarction due to thrombosis of left middle cerebral artery: Secondary | ICD-10-CM | POA: Diagnosis not present

## 2019-04-25 DIAGNOSIS — N179 Acute kidney failure, unspecified: Secondary | ICD-10-CM | POA: Diagnosis not present

## 2019-04-25 DIAGNOSIS — R579 Shock, unspecified: Secondary | ICD-10-CM | POA: Diagnosis not present

## 2019-04-25 LAB — CULTURE, RESPIRATORY W GRAM STAIN: Culture: NORMAL

## 2019-04-25 LAB — POCT I-STAT 7, (LYTES, BLD GAS, ICA,H+H)
Acid-Base Excess: 4 mmol/L — ABNORMAL HIGH (ref 0.0–2.0)
Bicarbonate: 30.2 mmol/L — ABNORMAL HIGH (ref 20.0–28.0)
Calcium, Ion: 1 mmol/L — ABNORMAL LOW (ref 1.15–1.40)
HCT: 34 % — ABNORMAL LOW (ref 39.0–52.0)
Hemoglobin: 11.6 g/dL — ABNORMAL LOW (ref 13.0–17.0)
O2 Saturation: 99 %
Patient temperature: 96.8
Potassium: 3.8 mmol/L (ref 3.5–5.1)
Sodium: 135 mmol/L (ref 135–145)
TCO2: 32 mmol/L (ref 22–32)
pCO2 arterial: 49.1 mmHg — ABNORMAL HIGH (ref 32.0–48.0)
pH, Arterial: 7.393 (ref 7.350–7.450)
pO2, Arterial: 167 mmHg — ABNORMAL HIGH (ref 83.0–108.0)

## 2019-04-25 LAB — RENAL FUNCTION PANEL
Albumin: 2.9 g/dL — ABNORMAL LOW (ref 3.5–5.0)
Albumin: 2.8 g/dL — ABNORMAL LOW (ref 3.5–5.0)
Anion gap: 19 — ABNORMAL HIGH (ref 5–15)
Anion gap: 17 — ABNORMAL HIGH (ref 5–15)
BUN: 31 mg/dL — ABNORMAL HIGH (ref 6–20)
BUN: 35 mg/dL — ABNORMAL HIGH (ref 6–20)
CO2: 26 mmol/L (ref 22–32)
CO2: 28 mmol/L (ref 22–32)
Calcium: 7.7 mg/dL — ABNORMAL LOW (ref 8.9–10.3)
Calcium: 7.6 mg/dL — ABNORMAL LOW (ref 8.9–10.3)
Chloride: 93 mmol/L — ABNORMAL LOW (ref 98–111)
Chloride: 93 mmol/L — ABNORMAL LOW (ref 98–111)
Creatinine, Ser: 4.09 mg/dL — ABNORMAL HIGH (ref 0.61–1.24)
Creatinine, Ser: 3.91 mg/dL — ABNORMAL HIGH (ref 0.61–1.24)
GFR calc Af Amer: 21 mL/min — ABNORMAL LOW (ref >60)
GFR calc Af Amer: 22 mL/min — ABNORMAL LOW (ref >60)
GFR calc non Af Amer: 18 mL/min — ABNORMAL LOW (ref >60)
GFR calc non Af Amer: 19 mL/min — ABNORMAL LOW (ref >60)
Glucose, Bld: 171 mg/dL — ABNORMAL HIGH (ref 70–99)
Glucose, Bld: 171 mg/dL — ABNORMAL HIGH (ref 70–99)
Phosphorus: 4 mg/dL (ref 2.5–4.6)
Phosphorus: 4.9 mg/dL — ABNORMAL HIGH (ref 2.5–4.6)
Potassium: 4.2 mmol/L (ref 3.5–5.1)
Potassium: 4.3 mmol/L (ref 3.5–5.1)
Sodium: 138 mmol/L (ref 135–145)
Sodium: 138 mmol/L (ref 135–145)

## 2019-04-25 LAB — GLUCOSE, CAPILLARY
Glucose-Capillary: 160 mg/dL — ABNORMAL HIGH (ref 70–99)
Glucose-Capillary: 162 mg/dL — ABNORMAL HIGH (ref 70–99)
Glucose-Capillary: 164 mg/dL — ABNORMAL HIGH (ref 70–99)
Glucose-Capillary: 168 mg/dL — ABNORMAL HIGH (ref 70–99)
Glucose-Capillary: 173 mg/dL — ABNORMAL HIGH (ref 70–99)
Glucose-Capillary: 206 mg/dL — ABNORMAL HIGH (ref 70–99)

## 2019-04-25 LAB — HEPATIC FUNCTION PANEL
ALT: 2219 U/L — ABNORMAL HIGH (ref 0–44)
AST: 1920 U/L — ABNORMAL HIGH (ref 15–41)
Albumin: 2.9 g/dL — ABNORMAL LOW (ref 3.5–5.0)
Alkaline Phosphatase: 94 U/L (ref 38–126)
Bilirubin, Direct: 1.5 mg/dL — ABNORMAL HIGH (ref 0.0–0.2)
Indirect Bilirubin: 2.4 mg/dL — ABNORMAL HIGH (ref 0.3–0.9)
Total Bilirubin: 3.9 mg/dL — ABNORMAL HIGH (ref 0.3–1.2)
Total Protein: 5.9 g/dL — ABNORMAL LOW (ref 6.5–8.1)

## 2019-04-25 LAB — CBC
HCT: 36.5 % — ABNORMAL LOW (ref 39.0–52.0)
Hemoglobin: 12.5 g/dL — ABNORMAL LOW (ref 13.0–17.0)
MCH: 30.6 pg (ref 26.0–34.0)
MCHC: 34.2 g/dL (ref 30.0–36.0)
MCV: 89.5 fL (ref 80.0–100.0)
Platelets: 124 10*3/uL — ABNORMAL LOW (ref 150–400)
RBC: 4.08 MIL/uL — ABNORMAL LOW (ref 4.22–5.81)
RDW: 12.1 % (ref 11.5–15.5)
WBC: 15.6 10*3/uL — ABNORMAL HIGH (ref 4.0–10.5)
nRBC: 0.3 % — ABNORMAL HIGH (ref 0.0–0.2)

## 2019-04-25 LAB — COOXEMETRY PANEL
Carboxyhemoglobin: 1.6 % — ABNORMAL HIGH (ref 0.5–1.5)
Methemoglobin: 1 % (ref 0.0–1.5)
O2 Saturation: 85 %
Total hemoglobin: 12.7 g/dL (ref 12.0–16.0)

## 2019-04-25 LAB — URINE CULTURE: Culture: NO GROWTH

## 2019-04-25 LAB — ECHOCARDIOGRAM LIMITED
Height: 73 in
Weight: 4515.02 oz

## 2019-04-25 LAB — POTASSIUM
Potassium: 4.4 mmol/L (ref 3.5–5.1)
Potassium: 3.9 mmol/L (ref 3.5–5.1)

## 2019-04-25 LAB — MAGNESIUM: Magnesium: 2.1 mg/dL (ref 1.7–2.4)

## 2019-04-25 LAB — PROCALCITONIN: Procalcitonin: 28.31 ng/mL

## 2019-04-25 MED ORDER — VITAL HIGH PROTEIN PO LIQD
1000.0000 mL | ORAL | Status: DC
  Administered 2019-04-25 – 2019-05-05 (×11): 1000 mL
  Filled 2019-04-25: qty 1000

## 2019-04-25 MED ORDER — PRISMASOL BGK 4/2.5 32-4-2.5 MEQ/L REPLACEMENT SOLN
Status: DC
  Filled 2019-04-25 (×10): qty 5000

## 2019-04-25 MED ORDER — VITAL HIGH PROTEIN PO LIQD: 1000.0000 mL | ORAL | Status: DC

## 2019-04-25 MED ORDER — FENTANYL CITRATE (PF) 100 MCG/2ML IJ SOLN
25.0000 ug | INTRAMUSCULAR | Status: DC | PRN
  Administered 2019-04-28 – 2019-05-02 (×22): 100 ug via INTRAVENOUS
  Administered 2019-05-02: 14:00:00 50 ug via INTRAVENOUS
  Administered 2019-05-02 – 2019-05-08 (×5): 100 ug via INTRAVENOUS
  Filled 2019-04-25 (×30): qty 2

## 2019-04-25 MED ORDER — HEPARIN SODIUM (PORCINE) 1000 UNIT/ML DIALYSIS: 1000.0000 [IU] | INTRAMUSCULAR | Status: DC | PRN

## 2019-04-25 MED ORDER — CHLORHEXIDINE GLUCONATE CLOTH 2 % EX PADS
6.0000 | MEDICATED_PAD | Freq: Every day | CUTANEOUS | Status: AC
  Administered 2019-04-26 (×2): 6 via TOPICAL

## 2019-04-25 MED ORDER — CHLORHEXIDINE GLUCONATE CLOTH 2 % EX PADS
6.0000 | MEDICATED_PAD | Freq: Every day | CUTANEOUS | Status: DC
  Administered 2019-04-28 – 2019-05-09 (×13): 6 via TOPICAL

## 2019-04-25 MED ORDER — PERFLUTREN LIPID MICROSPHERE
1.0000 mL | INTRAVENOUS | Status: DC | PRN
  Administered 2019-04-25: 3 mL via INTRAVENOUS
  Filled 2019-04-25: qty 10

## 2019-04-25 MED ORDER — PRO-STAT SUGAR FREE PO LIQD
60.0000 mL | Freq: Three times a day (TID) | ORAL | Status: DC
  Administered 2019-04-25 – 2019-05-19 (×69): 60 mL
  Filled 2019-04-25 (×68): qty 60

## 2019-04-25 MED ORDER — HYDROCORTISONE NA SUCCINATE PF 100 MG IJ SOLR
50.0000 mg | Freq: Two times a day (BID) | INTRAMUSCULAR | Status: DC
  Administered 2019-04-25 – 2019-04-28 (×6): 50 mg via INTRAVENOUS
  Filled 2019-04-25 (×6): qty 2

## 2019-04-25 MED ORDER — HEPARIN (PORCINE) 25000 UT/250ML-% IV SOLN
1300.0000 [IU]/h | INTRAVENOUS | Status: DC
  Administered 2019-04-25: 1250 [IU]/h via INTRAVENOUS
  Administered 2019-04-26: 1950 [IU]/h via INTRAVENOUS
  Administered 2019-04-27 (×2): 2000 [IU]/h via INTRAVENOUS
  Administered 2019-04-28 (×2): 1500 [IU]/h via INTRAVENOUS
  Administered 2019-04-29: 1000 [IU]/h via INTRAVENOUS
  Administered 2019-04-30: 1300 [IU]/h via INTRAVENOUS
  Administered 2019-04-30: 1250 [IU]/h via INTRAVENOUS
  Administered 2019-05-01: 1300 [IU]/h via INTRAVENOUS
  Filled 2019-04-25 (×9): qty 250

## 2019-04-25 NOTE — Progress Notes (Signed)
Nutrition Follow-up  DOCUMENTATION CODES:   Obesity unspecified  INTERVENTION:   Vital High Protein @ 20 ml/hr via OG tube  Recommend increase to goal:  Vital High Protein @ 50 ml/hr (1262ml/day) via OG tube 60 ml Prostat TID  Provides: 1800 kcal, 195 grams protein, and 1003 ml free water.    NUTRITION DIAGNOSIS:   Inadequate oral intake related to inability to eat as evidenced by NPO status. Ongoing.   GOAL:   Patient will meet greater than or equal to 90% of their needs Progressing.   MONITOR:   Vent status  REASON FOR ASSESSMENT:   Ventilator    ASSESSMENT:   Pt with PMH of CHF, OSA, poorly controlled DM, cocaine abuse admitted with L MCA stroke s/p tPA and IR thrombectomy.   Pt discussed during ICU rounds and with RN. Per Nephrology pt will continue CRRT as he has no evidence of renal recovery. Pt with negative balance of 5200/hr  2/6 started on CRRT with increasing pressor requirements.    Patient is currently intubated on ventilator support MV: 10.1 L/min Temp (24hrs), Avg:98.6 F (37 C), Min:97.7 F (36.5 C), Max:99.5 F (37.5 C)  Medications reviewed and include:  Solucortef, SSI Levo @ 6 mcg   Labs reviewed:  AST: 1920 (H) ALT: 2219 (H)   2 L positive  14 F OG tube CRRT: 4591 ml   Diet Order:   Diet Order            Diet NPO time specified  Diet effective now              EDUCATION NEEDS:   No education needs have been identified at this time  Skin:  Skin Assessment: Reviewed RN Assessment  Last BM:  unknown  Height:   Ht Readings from Last 1 Encounters:  04/21/2019 6\' 1"  (1.854 m)    Weight:   Wt Readings from Last 1 Encounters:  04/25/19 128 kg    Ideal Body Weight:  83.6 kg  BMI:  Body mass index is 37.23 kg/m.  Estimated Nutritional Needs:   Kcal:  1600-1800  Protein:  167 grams  Fluid:  2 L/day  Lockie Pares., RD, LDN, CNSC See AMiON for contact information

## 2019-04-25 NOTE — Progress Notes (Addendum)
UNASSIGNED PATIENT Subjective: [name], he is a 34 year old black male with multiple medical problems including severe CHF cocaine abuse septic shock on a ventilator requiring pressors and broad-spectrum antibiotics with markedly elevated LFTs admitted with an acute stroke on admission his AST was over 10,000 and ALT was 10,132.  He was admitted with slurred speech and right-sided weakness and CT of the head showed M2 occlusion he was treated with thrombolytics and thrombectomy by IR was done required intubation for airway protection he has an EF of 15% with a history of nonischemic cardiomyopathy sleep apnea and diabetes.    Objective: Vital signs in last 24 hours: Temp:  [96.4 F (35.8 C)-99.5 F (37.5 C)] 96.4 F (35.8 C) (02/08 1400) Pulse Rate:  [61-129] 70 (02/08 1400) Resp:  [21-32] 21 (02/08 1400) BP: (70-121)/(52-86) 107/70 (02/08 1400) SpO2:  [99 %-100 %] 100 % (02/08 1400) Arterial Line BP: (64-131)/(43-78) 111/68 (02/08 1200) FiO2 (%):  [50 %-70 %] 50 % (02/08 1108) Weight:  LB:1403352 kg] 128 kg (02/08 0500) Last BM Date: (pta)  Intake/Output from previous day: 02/07 0701 - 02/08 0700 In: 1479.3 [I.V.:708.3; NG/GT:60; IV Piggyback:700] Out: 4705 [Urine:14; Emesis/NG output:100] Intake/Output this shift: Total I/O In: 354.4 [I.V.:104.2; IV Piggyback:250.1] Out: 1140 [Other:1140]  General appearance: appears stated age and morbidly obese, intubated and sedated on the ventilator Resp: clear to auscultation bilaterally Cardio: regular rate and rhythm, S1, S2 normal, no murmur, click, rub or gallop GI: soft, non-tender; bowel sounds normal; no masses,  no organomegaly Extremities: extremities normal, atraumatic, no cyanosis or edema  Lab Results: Recent Labs    04/23/19 0945 04/23/19 1022 04/24/19 0843 04/24/19 0843 04/24/19 0911 04/24/19 1611 04/25/19 0358  WBC 16.2*  --  17.6*  --   --   --  15.6*  HGB 14.6   < > 13.7   < > 13.6 12.6* 12.5*  HCT  44.0   < > 39.8   < > 40.0 37.0* 36.5*  PLT 145*  --  137*  --   --   --  124*   < > = values in this interval not displayed.   BMET Recent Labs    04/24/19 0843 04/24/19 0911 04/24/19 1611 04/24/19 1611 04/24/19 1708 04/24/19 2023 04/24/19 2315 04/25/19 0358 04/25/19 1008  NA 139   < > 137  --  139  --   --  138  --   K 5.3*   < > 4.0   < > 4.3   < > 4.4 4.2 3.9  CL 98  --   --   --  98  --   --  93*  --   CO2 27  --   --   --  26  --   --  26  --   GLUCOSE 206*  --   --   --  159*  --   --  171*  --   BUN 28*  --   --   --  28*  --   --  31*  --   CREATININE 4.77*  --   --   --  4.32*  --   --  4.09*  --   CALCIUM 7.1*  --   --   --  7.3*  --   --  7.7*  --    < > = values in this interval not displayed.   LFT Recent Labs    04/25/19 1008  PROT 5.9*  ALBUMIN  2.9*  AST 1,920*  ALT 2,219*  ALKPHOS 94  BILITOT 3.9*  BILIDIR 1.5*  IBILI 2.4*   PT/INR Recent Labs    04/23/19 0945 04/24/19 0843  LABPROT 31.7* 20.3*  INR 3.1* 1.8*   Studies/Results: US Abdomen Complete  Result Date: 04/23/2019 CLINICAL DATA:  34 year old male with elevated LFTs EXAM: ABDOMEN ULTRASOUND COMPLETE COMPARISON:  Hepatic liver Doppler examination performed concurrently. FINDINGS: Gallbladder: No gallstones or wall thickening visualized. No sonographic Murphy sign noted by sonographer. Common bile duct: Diameter: Normal at 3 mm Liver: The liver is diffusely heterogeneous and predominantly echogenic. Additionally, there is coarsening of the echotexture. No discrete lesion identified. Portal vein is patent on color Doppler imaging with normal direction of blood flow towards the liver. IVC: No abnormality visualized. Pancreas: Not well seen due to overlying bowel gas. Spleen: Size and appearance within normal limits. Right Kidney: Length: 12.3 cm. Echogenicity within normal limits. No mass or hydronephrosis visualized. Left Kidney: Length: 12.5 cm. Echogenicity within normal limits. No mass or  hydronephrosis visualized. Abdominal aorta: No aneurysm visualized. Other findings: None. IMPRESSION: 1. Diffusely heterogeneous and echogenic hepatic parenchyma. While nonspecific, the imaging appearance is most suggestive of hepatic steatosis. 2. No evidence of cholelithiasis or biliary ductal dilatation. Electronically Signed   By: Jacqulynn Cadet M.D.   On: 04/23/2019 14:54   DG CHEST PORT 1 VIEW  Result Date: 04/25/2019 CLINICAL DATA:  Respiratory failure, COVID-19 positivity EXAM: PORTABLE CHEST 1 VIEW COMPARISON:  04/23/2019 FINDINGS: Endotracheal tube, gastric catheter and bilateral jugular central lines are again noted and stable. Cardiac shadow remains enlarged but accentuated by the portable technique. Previously seen airspace opacities have improved significantly with some mild persistent basilar changes. Small effusions are seen. No bony abnormality is noted. IMPRESSION: Overall improved aeration from the prior exam although persistent opacities and small effusions are noted. Electronically Signed   By: Inez Catalina M.D.   On: 04/25/2019 09:46   DG Chest Port 1 View  Result Date: 04/23/2019 CLINICAL DATA:  Central line placement EXAM: PORTABLE CHEST 1 VIEW COMPARISON:  04/23/2019 FINDINGS: Interval placement of a right neck vascular sheath, tip positioned over the right brachiocephalic vein. Left neck vascular catheter remains in position, tip over the mid SVC. Endotracheal tube and esophagogastric tube in unchanged position. Slight interval increase in diffuse bilateral interstitial and heterogeneous airspace opacity. Cardiomegaly. IMPRESSION: 1. Interval placement of a right neck vascular sheath, tip positioned over the right brachiocephalic vein. 2.  Other support apparatus in unchanged position. 3. Slight interval increase in diffuse bilateral interstitial and heterogeneous airspace opacity, consistent with multifocal infection, edema, and/or ARDS. 4.  Cardiomegaly. Electronically Signed    By: Eddie Candle M.D.   On: 04/23/2019 16:36   US LIVER DOPPLER  Result Date: 04/23/2019 CLINICAL DATA:  34 year old male currently intubated and ventilated with elevated LFTs. EXAM: DUPLEX ULTRASOUND OF LIVER TECHNIQUE: Color and duplex Doppler ultrasound was performed to evaluate the hepatic in-flow and out-flow vessels. COMPARISON:  None. Concurrently obtained abdominal ultrasound FINDINGS: Liver: Diffusely heterogeneous hepatic parenchyma which is coarsened in echotexture and predominantly echogenic. Normal hepatic contour without nodularity. Main Portal Vein size: 1 cm Portal Vein Velocities Main Prox:  48 cm/sec with normal hepatopetal flow Main Mid: 48 cm/sec Main Dist:  49 cm/sec Right: 55 cm/sec with normal hepatopetal flow Left: 56 cm/sec with normal hepatopetal flow Hepatic Vein Velocities Right:  44 cm/sec Middle:  132 cm/sec Left:  127 cm/sec IVC: Present and patent with normal respiratory phasicity. Hepatic Artery  Velocity:  185 cm/sec Splenic Vein Velocity:  80 cm/sec Spleen: 5.9 x 4.4 x 10.1 cm with a total volume of 136 cm^3 (411 cm^3 is upper limit normal) Portal Vein Occlusion/Thrombus: No Splenic Vein Occlusion/Thrombus: No Ascites: None Varices: None IMPRESSION: 1. Widely patent portal veins with normal hepatopetal directional flow. 2. No evidence of hepatic venous thrombosis. Electronically Signed   By: Jacqulynn Cadet M.D.   On: 04/23/2019 15:03   Medications: I have reviewed the patient's current medications.  Assessment/Plan: 1) Acute stroke with left middle cerebral artery embolism status post thrombolytics and thrombectomy by IR in acute septic shock with shock liver and significantly elevated LFTs that seem to be improving.  2) Acute respiratory failure. 3) Acute renal failure. 4) Nonischemic cardiomyopathy-EF 15 to 20%.  LOS: 3 days   Juanita Craver 04/25/2019, 2:20 PM

## 2019-04-25 NOTE — Progress Notes (Signed)
NAME:  Carlos Brewer., MRN:  [id number], DOB:  February 04, 1986, LOS: 3 ADMISSION DATE:  05/04/2019, CONSULTATION DATE:  05/01/2019 REFERRING MD:  Dr. Leonel Ramsay, CHIEF COMPLAINT:  Slurred speech  Brief History   34 yo male smoker found to have slurred speech and Rt sided weakness.  CT head showed M2 occlusion.  Ultimately treated with thrombolytic and thrombectomy by IR.  Required intubation for airway protection.    Past Medical History  Systolic CHF with non ischemic CM, Cocaine abuse, OSA, DM Hx of CHF (EF 15%)  Hx myocarditis Smoker 1/2 ppd   Significant Hospital Events   2/05 Admit, tPA, IR thrombectomy 2/6 100% on fvent at 2300  Overnight requiring increasing levophed and phenylephrine.    2/6: switched to levophed, epi, started antibiotics, started on CRRT.   Consults:  Neuro IR  Procedures:  ETT 2/05 >>  [facility] CVL 2/5 >> RIJ HD cath 2/6 >>  2/5-2/6:  TPA given at 428 am (total of 90 Mg) 520 am went to IR  S/P Lt common carotid arteriogram followed by complete revascularization of occluded LT MCA sup division mid M2 seg with x 1 pass with 52mx 40 mm solitaire X ret river device and penumbra aspiration with TICI 3 revascularization   Significant Diagnostic Tests:  CT angio head/neck 2/05 >> occlusion of Lt MCA bifurcation Echo 2/05 >> EF less than 20%, cannot rule out apical thrombus  Micro Data:  SARS CoV2 PCR 2/05 >> negative Influenza PCR 2/05 >> negative Urine 2/6 >> ng resp 2/6 >> BC 2/6 >>ng  Antimicrobials:  mero 2/7 >> vanc 2/6 >>  Interim history/subjective:   Critically ill, intubated, on low-dose fentanyl drip On CRRT On low-dose Levophed Anuric    Objective   Blood pressure 109/78, pulse 72, temperature 98.2 F (36.8 C), resp. rate (!) 24, height 6' 1"  (1.854 m), weight 128 kg, SpO2 100 %. CVP:  [10 mmHg-11 mmHg] 11 mmHg  Vent Mode: PCV FiO2 (%):  [50 %-70 %] 50 % Set Rate:  [24 bmp-65 bmp] 24 bmp Vt Set:  [630 mL] 630 mL PEEP:   [5 cmH20-10 cmH20] 5 cmH20 Plateau Pressure:  [25 cmH20-28 cmH20] 26 cmH20   Intake/Output Summary (Last 24 hours) at 04/25/2019 0914 Last data filed at 04/25/2019 0900 Gross per 24 hour  Intake 1452.95 ml  Output 4685 ml  Net -3232.05 ml   Filed Weights   05/14/2019 0737 04/24/19 0500 04/25/19 0500  Weight: 124.2 kg 133 kg 128 kg    Examination:  General -young obese well-built, no distress Eyes -left upward gaze with nystagmus ENT - ETT in place , mild pallor, no icterus Cardiac -S1-S2 distant, no murmur Chest - no wheeze, bilateral ventilated breath sounds Abdomen - soft, non tender, + bowel sounds Extremities - no cyanosis, clubbing, or edema Skin - no rashes Neuro -opens eyes to name, does not follow commands, right hemiplegia, moves left arm and leg to painful stimulus, left upward gaze with nystagmus   Chest x-ray 2/6 personally reviewed shows lines and tubes in position, increased bilateral interstitial and airspace infiltrates   Labs show normal electrolytes, high procalcitonin, low ionized calcium , slight decrease in leukocytosis , stable thrombocytopenia Resolved Hospital Problem list     Assessment & Plan:  L MCA CVA: s/p tpa 430 AM on 2/5 S/p IR clot removal.    Acute metabolic encephalopathy:   Likely metabolic (AKI, shock, transaminitis), combined with residual fentanyl.   Shock, septic:  with organ failure (  transaminitis and AKI).   Possibly UTI vs PNA.  Remains on mero, vanc-simplify once cultures back Decrease hydrocortisone to 50 every 12    Acute hypoxemic respiratory failure: B infiltrates, ARDS vs cardiogenic pulm edema. Oxygenation has improved  since initiated volume removal with CRRT.  LTV ventilation as tolerates.  -Decrease PEEP to 5 Decrease respiratory rate to 24 Start spontaneous breathing trials  Transaminitis 2/2 hypoperfusion (shock liver) Coagulopathy - INR improved  (FFP x 2  2/6) Appreciate GI consult.  Caution with  acetaminophen if needed for fever.  Follow LFTs   AKI;  Continue CRRT per renal, tolerating negative balance of 5200/hour Replete hypocalcemia as needed   Hx of cocaine abuse. depression. - not active per UDS  - continue prozac  #Cardiogenic shock Non ischemic CM with chronic systolic CHF. Hx of HTN, HLD. Dx 2018 - followed by [facility] in [place] - continue lipitor, digoxin - resume ASA when okay with neurology - hold outpt coreg, zaroxolyn, entresto, aldactone, torsemide for now Cardiology consulting Holding home digoxin.   DM type II poorly controlled. - SSI -CBG 1 40-1 8 acceptable  Summary-improving shock, remains to be seen with a regular renal recovery.  Mental status remains poor  Best practice:  Diet: NPO, start TFs DVT prophylaxis: SCDs GI prophylaxis: protonix Mobility: bed rest Code Status: full code Disposition: ICU Family - mother . Marland KitchenPlan for aggressive care, esp given his young age.  He has many supportive family and also has young children.    The patient is critically ill with multiple organ systems failure and requires high complexity decision making for assessment and support, frequent evaluation and titration of therapies, application of advanced monitoring technologies and extensive interpretation of multiple databases. Critical Care Time devoted to patient care services described in this note independent of APP/resident  time is 35 minutes.    [name] MD. [facility] Flood. [facility]  If no response to pager , please call [phone]   04/25/2019

## 2019-04-25 NOTE — Progress Notes (Addendum)
STROKE TEAM PROGRESS NOTE   INTERVAL HISTORY RN and pt mom at bedside. Pt is still intubated, and on CRRT at bedside. Able to open eyes with voice and pain stimulation, still left gaze preference and not following commands. Withdraw slightly in all extremities to pain. BP still soft on pressors.  Afebrile and leukocytosis is improving.    OBJECTIVE Vitals:   04/25/19 0645 04/25/19 0700 04/25/19 0726 04/25/19 0800  BP:  111/83  113/82  Pulse: 72 71  69  Resp: (!) 32 (!) 32  (!) 32  Temp: 99 F (37.2 C) 99 F (37.2 C)  98.8 F (37.1 C)  TempSrc:      SpO2: 100% 100% 100% 100%  Weight:      Height:        CBC:  Recent Labs  Lab 04/23/19 0945 04/23/19 1022 04/24/19 0843 04/24/19 0911 04/24/19 1611 04/25/19 0358  WBC 16.2*   < > 17.6*  --   --  15.6*  NEUTROABS 13.5*  --  15.8*  --   --   --   HGB 14.6   < > 13.7   < > 12.6* 12.5*  HCT 44.0   < > 39.8   < > 37.0* 36.5*  MCV 92.6   < > 92.3  --   --  89.5  PLT 145*   < > 137*  --   --  124*   < > = values in this interval not displayed.    Basic Metabolic Panel:  Recent Labs  Lab 04/24/19 1708 04/24/19 2023 04/24/19 2315 04/25/19 0358  NA 139  --   --  138  K 4.3   < > 4.4 4.2  CL 98  --   --  93*  CO2 26  --   --  26  GLUCOSE 159*  --   --  171*  BUN 28*  --   --  31*  CREATININE 4.32*  --   --  4.09*  CALCIUM 7.3*  --   --  7.7*  MG 1.9  --   --  2.1  PHOS 4.5  --   --  4.0   < > = values in this interval not displayed.   Lipid Panel:     Component Value Date/Time   CHOL 265 (H) 04/23/2019 0327   TRIG 191 (H) 04/23/2019 0327   HDL 24 (L) 04/23/2019 0327   CHOLHDL 11.0 04/23/2019 0327   VLDL 38 04/23/2019 0327   LDLCALC 203 (H) 04/23/2019 0327   HgbA1c:  Lab Results  Component Value Date   HGBA1C 6.5 (H) 04/23/2019   Urine Drug Screen:     Component Value Date/Time   LABOPIA NONE DETECTED 04/23/2019 0930   COCAINSCRNUR NONE DETECTED 04/23/2019 0930   COCAINSCRNUR NONE DETECTED 02/04/2018 1010    LABBENZ POSITIVE (A) 04/23/2019 0930   AMPHETMU NONE DETECTED 04/23/2019 0930   THCU NONE DETECTED 04/23/2019 0930   LABBARB NONE DETECTED 04/23/2019 0930    Alcohol Level     Component Value Date/Time   ETH <10 05/04/2019 0355    IMAGING  CT HEAD CODE STROKE WO CONTRAST 05/04/2019 IMPRESSION:  1. Asymmetric hyperdensity of a Left MCA branch in the Sylvian fissure suspicious for emergent large vessel occlusion in the setting.  2. No associated acute cortically based infarct, ASPECTS 10. No acute hemorrhage.  3. Chronic right frontal operculum encephalomalacia.   CT Code Stroke CTA Head W/WO contrast CT Code Stroke CTA Neck W/WO  contrast 05/11/2019 IMPRESSION:  1. Positive for emergent large vessel occlusion at the Left MCA bifurcation. There is reconstitution of the dominant posterior division branch.  2. Elsewhere negative CTA head and neck. Dominant right vertebral artery which supplies the basilar. Fetal type PCA origins.   CT CEREBRAL PERFUSION W CONTRAST 05/03/2019 IMPRESSION:  1. CTP detects evidence of Left MCA territory oligemia concordant with the CTA findings.  2. No infarct core detected with standard CBF <30%.  3. 60 mL of penumbra detected with standard T-max > 6s.   CT HEAD WO CONTRAST 04/23/2019 IMPRESSION:  Small region of loss of gray-white differentiation in the left frontal operculum consistent with a fairly small area of acute infarction compared to the previous perfusion abnormality. No hemorrhage or mass effect. Areas of low-density in the cerebellum that I favor are artifactual. There did not seem to be any abnormalities in that region on the previous examinations.   MRI Head WO Contrast - pending  DG CHEST PORT 1 VIEW 05/01/2019 IMPRESSION:  1. Interval placement of neck vascular catheter, tip positioned near the superior cavoatrial junction.  2. Interval retraction of endotracheal tube, now positioned just above the thoracic inlet, approximately 6.5 cm  above the carina.  3. Interval placement of esophagogastric tube, tip and side port below the diaphragm.  4. Stable cardiomegaly and diffuse bilateral interstitial airspace opacity, likely edema.   DG Chest Port 1 View 04/25/2019 IMPRESSION:  1. Endotracheal tube tip noted just above the carina. Proximal repositioning of 2-3 should be considered.  2. Cardiomegaly again noted. Diffuse bilateral pulmonary infiltrates/edema. Small left pleural effusion.   DG Chest Port 1 View  04/23/19 IMPRESSION: 1. Interval placement of a right neck vascular sheath, tip positioned over the right brachiocephalic vein. 2.  Other support apparatus in unchanged position. 3. Slight interval increase in diffuse bilateral interstitial and heterogeneous airspace opacity, consistent with multifocal infection, edema, and/or ARDS. 4.  Cardiomegaly.  ECHOCARDIOGRAM COMPLETE 04/18/2019 IMPRESSIONS   1. Left ventricular ejection fraction, by visual estimation, is <20%. The left ventricle has severely decreased function. There is no left ventricular hypertrophy.   2. Apex is not well-visualized. In setting of stroke with severe LV systolic dysfunction, recommend repeat TTE with contrast to rule out LV apical thrombus   3. Severely dilated left ventricular internal cavity size.   4. Left ventricular diastolic parameters are consistent with Grade III diastolic dysfunction (restrictive).   5. Elevated left atrial pressure.   6. The left ventricle demonstrates global hypokinesis.   7. Global right ventricle has normal systolic function.The right ventricular size is mildly enlarged.   8. Left atrial size was moderately dilated.   9. Right atrial size was normal.  10. The mitral valve is normal in structure. Mild mitral valve regurgitation.  11. The tricuspid valve is normal in structure. Tricuspid valve regurgitation is trivial.  12. The aortic valve is normal in structure. Aortic valve regurgitation is not visualized. No  evidence of aortic valve sclerosis or stenosis.  13. The pulmonic valve was not well visualized. Pulmonic valve regurgitation is not visualized.   Interventional Neuro Radiology - Cerebral Angiogram with Intervention 04/19/2019 S/P Lt common carotid arteriogram followed by complete revascularization of occluded LT MCA sup division mid M2 seg with x 1 pass with 53mx 40 mm solitaire X ret river device and penumbra aspiration with TICI 3 revascularization  ECG - SR rate 93 BPM. (See cardiology reading for complete details)  PHYSICAL EXAM   Temp:  [98.1 F (36.7  C)-99.5 F (37.5 C)] 98.1 F (36.7 C) (02/08 1000) Pulse Rate:  [61-129] 72 (02/08 1000) Resp:  [24-32] 24 (02/08 1000) BP: (70-121)/(52-86) 106/76 (02/08 1000) SpO2:  [99 %-100 %] 100 % (02/08 1000) Arterial Line BP: (64-131)/(43-78) 118/69 (02/08 1000) FiO2 (%):  [50 %-70 %] 50 % (02/08 0726) Weight:  [093 kg] 128 kg (02/08 0500)  General - Well nourished, well developed, intubated just off sedation.  Ophthalmologic - fundi not visualized due to noncooperation.  Cardiovascular - Regular rate and rhythm.  Neuro - intubated just off sedation, eyes closed initially but easily open on voice, not following commands. With eye opening, eyes in left gaze preference position, not able to cross midline but tracking on the left, blinking to visual threat bilaterally, PERRL. Corneal reflex present, gag and cough present. Breathing over the vent.  Facial symmetry not able to test due to ET tube.  Tongue protrusion not cooperative. On pain stimulation, slightly withdraw in all extremities. DTR 1+ and no babinski. Sensation, coordination and gait not tested.   ASSESSMENT/PLAN [name]. is a 34 y.o. male with history of NICM EF 15-20%, CHF, DM, Obesity, tobacco use, OSA, and hx of cocaine use presenting with aphasia and right sided weakness. The patient received IV t-PA on Friday 05/14/2019 at 4:45 AM. Mechanical thrombectomy  left M2.  Stroke:  L MCA infarct s/p tPA & IR with left M2 TICI3 reperfusion - embolic - likely secondary to severe cardiomyopathy with low EF  Code Stroke CT Head - left MCA hyperdensity.   CTA H&N - emergent large vessel occlusion at the Left MCA bifurcation.   CT Perfusion - 60 mL of penumbra detected.   CT head repeat - Small region of loss of gray-white differentiation in the left frontal operculum   MRI head - pending once stable   2D Echo - EF < 20%   2D with contrast - possible LV apical thrombus 0.8 x 0.6  Sars Corona Virus 2 - negative  LDL - 203  HgbA1c - 6.5  VTE prophylaxis - heparin IV  aspirin 81 mg daily prior to admission, now on heparin IV per stroke protocol.  Therapy recommendations:  pending  Disposition:  Pending  Acute Hypoxemic Respiratory failure  Intubated  sedated   CCM on board  CXR - consistent with multifocal infection, edema, and/or ARDS  Cardiogenic vs Septic Shock  Fever, TMax 103.2->97.5->99   Leukocytosis, WBCs 21->15.6   CXR - consistent with multifocal infection, edema, and/or ARDS   On Mero, Vancomycin started 04/23/19>>  On hydrocortisone  U/A neg, Urine culture no growth   Blood cultures - pending    On levophed  LV thrombus Severe cardiomyopathy CHF Elevated troponin  2D Echo - EF < 20%   2D with contrast - possible LV apical thrombus 0.8 x 0.6  On heparin IV  Cardiology on board  AKI  Creatinine - 1.10->2.64->5.17->4.09  renal consult 04/23/19  Minimal urinary output  On CRRT  Hypotension w/ shock Hx Hypertension  Home BP meds: Coreg, Entresto  Now on levophed  Vasopressin weaned off  SBP goal  >110  Stable  Shock liver   GI on board  AST/ALT - >10000/10132 ->1920/2219  INR 3.1->1.8-> pending  improving  Hyperlipidemia  Home Lipid lowering medication: Lipitor 40 mg daily  LDL 203, goal < 70  Current lipid lowering medication: Lipitor 80 mg daily   Continue statin at  discharge  Diabetes  Home diabetic meds: insulin  Current  diabetic meds: SS insulin    HgbA1c 6.5, goal < 7.0  CBG monitoring   Dysphagia . Secondary to stroke . NPO . On tube feeding @ 50 . Speech on board  Tobacco abuse  Current smoker  Mom confirmed tobacco use  Smoking cessation counseling will be provided  Other Stroke Risk Factors  ETOH use, advised to drink no more than 1 alcoholic beverage per day.  Hx cocaine abuse  Obesity, Body mass index is 37.23 kg/m., recommend weight loss, diet and exercise as appropriate   Obstructive sleep apnea - uses Cpap  Substance Abuse Hx of cocaine use  Other Active Problems  Code status - Full code  Hyperkalemia - 7.2->...8.3->5.0->7.5   Metabolic acidosis, resolved  Depression on prozac  Hospital day # 3  This patient is critically ill and at significant risk of neurological worsening, death and care requires constant monitoring of vital signs, hemodynamics,respiratory and cardiac monitoring, extensive review of multiple databases, frequent neurological assessment, discussion with family, other specialists and medical decision making of high complexity. I spent 40 minutes of neurocritical care time  in the care of  this patient. I had long discussion with mom at bedside, updated pt current condition, treatment plan and potential prognosis, and answered all the questions. She expressed understanding and appreciation. I also discussed with Dr. Elsworth Soho.  Rosalin Hawking, MD PhD Stroke Neurology 04/25/2019 7:35 PM   To contact Stroke Continuity provider, please refer to [email or web address] After hours, contact General Neurology

## 2019-04-25 NOTE — Progress Notes (Addendum)
The patient has been seen in conjunction with Rosaria Ferries, PA-C. All aspects of care have been considered and discussed. The patient has been personally interviewed, examined, and all clinical data has been reviewed.   Nothing additional to add.  Continue respiratory, renal, and vasopressor support.  Guarded prognosis for meaningful recovery.   DAILY PROGRESS NOTE   Patient Name: Carlos Brewer. Date of Encounter: 04/25/2019 Cardiologist: Ida Rogue, MD  Patient Profile   34 yo male with history of NICM and myocarditis, followed by the advanced CHF service, presented 02/05 with acute MCA stroke treated with tPA and IR thrombectomy, now hypotensive, concerning for possible septic versus cardiogenic shock  Subjective   Weaning sedation, pt opens eyes at times, gaze generally to the L, no directed movements  Objective   Vitals:   04/25/19 0700 04/25/19 0726 04/25/19 0800 04/25/19 0900  BP: 111/83  113/82 109/78  Pulse: 71  69 72  Resp: (!) 32  (!) 32 (!) 24  Temp: 99 F (37.2 C)  98.8 F (37.1 C) 98.2 F (36.8 C)  TempSrc:      SpO2: 100% 100% 100% 100%  Weight:      Height:        Intake/Output Summary (Last 24 hours) at 04/25/2019 5732 Last data filed at 04/25/2019 0900 Gross per 24 hour  Intake 1452.95 ml  Output 4685 ml  Net -3232.05 ml   Filed Weights   04/26/2019 0737 04/24/19 0500 04/25/19 0500  Weight: 124.2 kg 133 kg 128 kg    Physical Exam   General appearance: intubated, sedated on vent Neck: no carotid bruit, no JVD, thyroid not enlarged, symmetric, no tenderness/mass/nodules and left IJ line, on CRRT Lungs: clear to auscultation bilaterally Heart: regular rate and rhythm Abdomen: soft, non-tender; bowel sounds normal; no masses,  no organomegaly and obese Extremities: extremities normal, atraumatic, no cyanosis or edema Pulses: 2+ and symmetric Skin: Skin color, texture, turgor normal. No rashes or lesions Neurologic: Mental status:  intubated, sedated Psych: Cannot assess  Inpatient Medications    Scheduled Meds: . sodium chloride   Intravenous Once  . chlorhexidine gluconate (MEDLINE KIT)  15 mL Mouth Rinse BID  . Chlorhexidine Gluconate Cloth  6 each Topical Q0600  . fentaNYL (SUBLIMAZE) injection  50 mcg Intravenous Once  . FLUoxetine  20 mg Per Tube Daily  . hydrocortisone sod succinate (SOLU-CORTEF) inj  50 mg Intravenous Q12H  . insulin aspart  0-20 Units Subcutaneous Q4H  . mouth rinse  15 mL Mouth Rinse 10 times per day  . mupirocin ointment  1 application Nasal BID  . pantoprazole (PROTONIX) IV  40 mg Intravenous Daily  . sodium chloride flush  10-40 mL Intracatheter Q12H    Continuous Infusions: .  prismasol BGK 4/2.5 500 mL/hr at 04/25/19 0616  . sodium chloride    . sodium chloride Stopped (04/24/19 0832)  . fentaNYL infusion INTRAVENOUS 50 mcg/hr (04/25/19 0900)  . meropenem (MERREM) IV Stopped (04/25/19 0541)  . norepinephrine (LEVOPHED) Adult infusion 5 mcg/min (04/25/19 0900)  . prismasol BGK 4/2.5 2,000 mL/hr at 04/25/19 0818  . sodium bicarbonate (isotonic) 1000 mL infusion 150 mL/hr at 04/25/19 0355  . vancomycin Stopped (04/24/19 1133)    PRN Meds: sodium chloride, bisacodyl, docusate, fentaNYL, heparin, heparin, labetalol **AND** [DISCONTINUED] niCARDipine, midazolam, sodium chloride flush   Labs   Results for orders placed or performed during the hospital encounter of 04/27/2019 (from the past 48 hour(s))  Rapid urine drug screen (hospital performed)  Status: Abnormal   Collection Time: 04/23/19  9:30 AM  Result Value Ref Range   Opiates NONE DETECTED NONE DETECTED   Cocaine NONE DETECTED NONE DETECTED   Benzodiazepines POSITIVE (A) NONE DETECTED   Amphetamines NONE DETECTED NONE DETECTED   Tetrahydrocannabinol NONE DETECTED NONE DETECTED   Barbiturates NONE DETECTED NONE DETECTED    Comment: (NOTE) DRUG SCREEN FOR MEDICAL PURPOSES ONLY.  IF CONFIRMATION IS NEEDED FOR ANY  PURPOSE, NOTIFY LAB WITHIN 5 DAYS. LOWEST DETECTABLE LIMITS FOR URINE DRUG SCREEN Drug Class                     Cutoff (ng/mL) Amphetamine and metabolites    1000 Barbiturate and metabolites    200 Benzodiazepine                 945 Tricyclics and metabolites     300 Opiates and metabolites        300 Cocaine and metabolites        300 THC                            50 Performed at [facility], [place] [address], [place], [place] [address]   Na and K (sodium & potassium), rand urine     Status: None   Collection Time: 04/23/19  9:30 AM  Result Value Ref Range   Sodium, Ur 17 mmol/L   Potassium Urine 96 mmol/L    Comment: Performed at [facility] [address] [place], [place] [address]  Urinalysis, Routine w reflex microscopic     Status: Abnormal   Collection Time: 04/23/19  9:30 AM  Result Value Ref Range   Color, Urine AMBER (A) YELLOW    Comment: BIOCHEMICALS MAY BE AFFECTED BY COLOR   APPearance CLOUDY (A) CLEAR   Specific Gravity, Urine 1.025 1.005 - 1.030   pH 6.0 5.0 - 8.0   Glucose, UA 50 (A) NEGATIVE mg/dL   Hgb urine dipstick LARGE (A) NEGATIVE   Bilirubin Urine NEGATIVE NEGATIVE   Ketones, ur NEGATIVE NEGATIVE mg/dL   Protein, ur >=300 (A) NEGATIVE mg/dL   Nitrite NEGATIVE NEGATIVE   Leukocytes,Ua NEGATIVE NEGATIVE   RBC / HPF >50 (H) 0 - 5 RBC/hpf   WBC, UA 11-20 0 - 5 WBC/hpf   Bacteria, UA FEW (A) NONE SEEN    Comment: Performed at [facility], [place] [address] [place], [place] [address]  Procalcitonin - Baseline     Status: None   Collection Time: 04/23/19  9:45 AM  Result Value Ref Range   Procalcitonin 8.21 ng/mL    Comment:        Interpretation: PCT > 2 ng/mL: Systemic infection (sepsis) is likely, unless other causes are known. (NOTE)       Sepsis PCT Algorithm           Lower Respiratory Tract                                      Infection PCT Algorithm    ----------------------------     ----------------------------          PCT < 0.25 ng/mL                PCT < 0.10 ng/mL         Strongly encourage  Strongly discourage   discontinuation of antibiotics    initiation of antibiotics    ----------------------------     -----------------------------       PCT 0.25 - 0.50 ng/mL            PCT 0.10 - 0.25 ng/mL               OR       >80% decrease in PCT            Discourage initiation of                                            antibiotics      Encourage discontinuation           of antibiotics    ----------------------------     -----------------------------         PCT >= 0.50 ng/mL              PCT 0.26 - 0.50 ng/mL               AND       <80% decrease in PCT              Encourage initiation of                                             antibiotics       Encourage continuation           of antibiotics    ----------------------------     -----------------------------        PCT >= 0.50 ng/mL                  PCT > 0.50 ng/mL               AND         increase in PCT                  Strongly encourage                                      initiation of antibiotics    Strongly encourage escalation           of antibiotics                                     -----------------------------                                           PCT <= 0.25 ng/mL                                                 OR                                        >  80% decrease in PCT                                     Discontinue / Do not initiate                                             antibiotics Performed at [facility], [place] [address], [place], [place] [address]   Troponin I (High Sensitivity)     Status: Abnormal   Collection Time: 04/23/19  9:45 AM  Result Value Ref Range   Troponin I (High Sensitivity) 16,274 (HH) <18 ng/L    Comment: CRITICAL RESULT CALLED TO, READ BACK BY AND VERIFIED WITH: RN J CARMICHAEL AT 1157 04/23/19 BY L BENFIELD (NOTE) Elevated high sensitivity troponin I (hsTnI)  values and significant  changes across serial measurements may suggest ACS but many other  chronic and acute conditions are known to elevate hsTnI results.  Refer to the Links section for chest pain algorithms and additional  guidance. Performed at [facility], [place] [address], [place], [place] [address]   Lactic acid, plasma     Status: Abnormal   Collection Time: 04/23/19  9:45 AM  Result Value Ref Range   Lactic Acid, Venous 6.5 (HH) 0.5 - 1.9 mmol/L    Comment: CRITICAL RESULT CALLED TO, READ BACK BY AND VERIFIED WITH: T.BLACKBURN RN @ 8295 04/23/2019 BY C.EDENS Performed at [facility], [place] [address], [place], [place] [address]   Comprehensive metabolic panel     Status: Abnormal   Collection Time: 04/23/19  9:45 AM  Result Value Ref Range   Sodium 140 135 - 145 mmol/L   Potassium 4.6 3.5 - 5.1 mmol/L   Chloride 109 98 - 111 mmol/L   CO2 13 (L) 22 - 32 mmol/L   Glucose, Bld 229 (H) 70 - 99 mg/dL   BUN 28 (H) 6 - 20 mg/dL   Creatinine, Ser 4.21 (H) 0.61 - 1.24 mg/dL    Comment: DELTA CHECK NOTED   Calcium 7.5 (L) 8.9 - 10.3 mg/dL   Total Protein 5.6 (L) 6.5 - 8.1 g/dL   Albumin 2.8 (L) 3.5 - 5.0 g/dL   AST >10,000 (H) 15 - 41 U/L    Comment: RESULTS CONFIRMED BY MANUAL DILUTION   ALT 10,132 (H) 0 - 44 U/L    Comment: RESULTS CONFIRMED BY MANUAL DILUTION   Alkaline Phosphatase 97 38 - 126 U/L   Total Bilirubin 1.3 (H) 0.3 - 1.2 mg/dL   GFR calc non Af Amer 17 (L) >60 mL/min   GFR calc Af Amer 20 (L) >60 mL/min   Anion gap 18 (H) 5 - 15    Comment: Performed at [facility], [place] [address] [place], [place] [address]  Protime-INR     Status: Abnormal   Collection Time: 04/23/19  9:45 AM  Result Value Ref Range   Prothrombin Time 31.7 (H) 11.4 - 15.2 seconds   INR 3.1 (H) 0.8 - 1.2    Comment: (NOTE) INR goal varies based on device and disease states. Performed at [facility], [place] [address] [place], [place] [address]   Fibrinogen      Status: Abnormal   Collection Time: 04/23/19  9:45 AM  Result Value  Ref Range   Fibrinogen 117 (L) 210 - 475 mg/dL    Comment: Performed at [facility] [address], [place], [place] [address]  CBC with Differential/Platelet     Status: Abnormal   Collection Time: 04/23/19  9:45 AM  Result Value Ref Range   WBC 16.2 (H) 4.0 - 10.5 K/uL   RBC 4.75 4.22 - 5.81 MIL/uL   Hemoglobin 14.6 13.0 - 17.0 g/dL   HCT 44.0 39.0 - 52.0 %   MCV 92.6 80.0 - 100.0 fL   MCH 30.7 26.0 - 34.0 pg   MCHC 33.2 30.0 - 36.0 g/dL   RDW 12.2 11.5 - 15.5 %   Platelets 145 (L) 150 - 400 K/uL   nRBC 0.0 0.0 - 0.2 %   Neutrophils Relative % 83 %   Neutro Abs 13.5 (H) 1.7 - 7.7 K/uL   Lymphocytes Relative 11 %   Lymphs Abs 1.8 0.7 - 4.0 K/uL   Monocytes Relative 5 %   Monocytes Absolute 0.8 0.1 - 1.0 K/uL   Eosinophils Relative 0 %   Eosinophils Absolute 0.0 0.0 - 0.5 K/uL   Basophils Relative 0 %   Basophils Absolute 0.0 0.0 - 0.1 K/uL   Immature Granulocytes 1 %   Abs Immature Granulocytes 0.11 (H) 0.00 - 0.07 K/uL    Comment: Performed at [facility] [address], [place], [place] [address]  Brain natriuretic peptide     Status: Abnormal   Collection Time: 04/23/19  9:45 AM  Result Value Ref Range   B Natriuretic Peptide 2,909.9 (H) 0.0 - 100.0 pg/mL    Comment: Performed at [facility] [address], [place], [place] [address]  I-STAT 7, (LYTES, BLD GAS, ICA, H+H)     Status: Abnormal   Collection Time: 04/23/19 10:22 AM  Result Value Ref Range   pH, Arterial 7.263 (L) 7.350 - 7.450   pCO2 arterial 32.5 32.0 - 48.0 mmHg   pO2, Arterial 74.0 (L) 83.0 - 108.0 mmHg   Bicarbonate 14.7 (L) 20.0 - 28.0 mmol/L   TCO2 16 (L) 22 - 32 mmol/L   O2 Saturation 93.0 %   Acid-base deficit 11.0 (H) 0.0 - 2.0 mmol/L   Sodium 140 135 - 145 mmol/L   Potassium 4.7 3.5 - 5.1 mmol/L   Calcium, Ion 1.08 (L) 1.15 - 1.40 mmol/L   HCT 41.0 39.0 - 52.0 %   Hemoglobin 13.9 13.0 - 17.0 g/dL    Patient temperature 98.6 F    Collection site RADIAL, ALLEN'S TEST ACCEPTABLE    Drawn by RT    Sample type ARTERIAL   Culture, respiratory (non-expectorated)     Status: None (Preliminary result)   Collection Time: 04/23/19 10:58 AM   Specimen: Tracheal Aspirate; Respiratory  Result Value Ref Range   Specimen Description TRACHEAL ASPIRATE    Special Requests NONE    Gram Stain      FEW WBC PRESENT, PREDOMINANTLY PMN FEW GRAM NEGATIVE COCCOBACILLI FEW GRAM POSITIVE COCCI IN PAIRS IN CLUSTERS RARE GRAM POSITIVE RODS    Culture      CULTURE REINCUBATED FOR BETTER GROWTH Performed at [facility], [place] [address], [place], [place] [address]    Report Status PENDING   Culture, blood (Routine X 2) w Reflex to ID Panel     Status: None (Preliminary result)   Collection Time: 04/23/19 11:20 AM   Specimen: BLOOD  Result Value Ref Range   Specimen Description  BLOOD RIGHT ANTECUBITAL    Special Requests      AEROBIC BOTTLE ONLY Blood Culture results may not be optimal due to an inadequate volume of blood received in culture bottles Performed at [facility] [address], [place], [place] [address]    Culture NO GROWTH < 24 HOURS    Report Status PENDING   Culture, blood (Routine X 2) w Reflex to ID Panel     Status: None (Preliminary result)   Collection Time: 04/23/19 11:21 AM   Specimen: BLOOD  Result Value Ref Range   Specimen Description BLOOD RIGHT ANTECUBITAL    Special Requests      AEROBIC BOTTLE ONLY Blood Culture results may not be optimal due to an inadequate volume of blood received in culture bottles Performed at [facility] [address], [place], [place] [address]    Culture NO GROWTH < 24 HOURS    Report Status PENDING   Potassium     Status: None   Collection Time: 04/23/19 11:22 AM  Result Value Ref Range   Potassium 5.0 3.5 - 5.1 mmol/L    Comment: Performed at [facility], [place] [address] [place], [place] [address]  Lactic acid,  plasma     Status: Abnormal   Collection Time: 04/23/19 11:22 AM  Result Value Ref Range   Lactic Acid, Venous 6.8 (HH) 0.5 - 1.9 mmol/L    Comment: CRITICAL VALUE NOTED.  VALUE IS CONSISTENT WITH PREVIOUSLY REPORTED AND CALLED VALUE. Performed at [facility], [place] [address], [place], [place] [address]   Ammonia     Status: Abnormal   Collection Time: 04/23/19 11:22 AM  Result Value Ref Range   Ammonia 68 (H) 9 - 35 umol/L    Comment: Performed at [facility], [place] [address] [place], [place] [address]  Troponin I (High Sensitivity)     Status: Abnormal   Collection Time: 04/23/19 11:22 AM  Result Value Ref Range   Troponin I (High Sensitivity) 14,226 (HH) <18 ng/L    Comment: CRITICAL VALUE NOTED.  VALUE IS CONSISTENT WITH PREVIOUSLY REPORTED AND CALLED VALUE. (NOTE) Elevated high sensitivity troponin I (hsTnI) values and significant  changes across serial measurements may suggest ACS but many other  chronic and acute conditions are known to elevate hsTnI results.  Refer to the Links section for chest pain algorithms and additional  guidance. Performed at [facility], [place] [address], [place], [place] [address]   Glucose, capillary     Status: Abnormal   Collection Time: 04/23/19 12:32 PM  Result Value Ref Range   Glucose-Capillary 182 (H) 70 - 99 mg/dL   Comment 1 Notify RN    Comment 2 Document in Chart   POCT I-Stat EG7     Status: Abnormal   Collection Time: 04/23/19 12:37 PM  Result Value Ref Range   pH, Ven 7.200 (L) 7.250 - 7.430   pCO2, Ven 39.5 (L) 44.0 - 60.0 mmHg   pO2, Ven 55.0 (H) 32.0 - 45.0 mmHg   Bicarbonate 15.2 (L) 20.0 - 28.0 mmol/L   TCO2 16 (L) 22 - 32 mmol/L   O2 Saturation 79.0 %   Acid-base deficit 12.0 (H) 0.0 - 2.0 mmol/L   Sodium 139 135 - 145 mmol/L   Potassium 5.0 3.5 - 5.1 mmol/L   Calcium, Ion 1.07 (L) 1.15 - 1.40 mmol/L   HCT 41.0 39.0 - 52.0 %   Hemoglobin 13.9 13.0 -  17.0 g/dL   Patient temperature 38.0 C    Sample  type VENOUS   Prepare fresh frozen plasma     Status: None   Collection Time: 04/23/19  1:15 PM  Result Value Ref Range   Unit Number [id number]    Blood Component Type THAWED PLASMA    Unit division 00    Status of Unit ISSUED,FINAL    Transfusion Status      OK TO TRANSFUSE Performed at [facility], [place] [address] [place], [place] [address]    Unit Number [phone]    Blood Component Type THAWED PLASMA    Unit division 00    Status of Unit ISSUED,FINAL    Transfusion Status OK TO TRANSFUSE   Potassium     Status: Abnormal   Collection Time: 04/23/19  1:56 PM  Result Value Ref Range   Potassium 5.8 (H) 3.5 - 5.1 mmol/L    Comment: Performed at [facility], [place] [address] [place], [place] [address]  CK     Status: Abnormal   Collection Time: 04/23/19  1:56 PM  Result Value Ref Range   Total CK 602 (H) 49 - 397 U/L    Comment: Performed at [facility], [place] [address] [place], [place] [address]  Culture, Urine     Status: None   Collection Time: 04/23/19  1:56 PM   Specimen: Urine, Random  Result Value Ref Range   Specimen Description URINE, RANDOM    Special Requests      NONE Performed at [facility], [place] [address], [place], [place] [address]    Culture NO GROWTH    Report Status 04/25/2019 FINAL   ABO/Rh     Status: None   Collection Time: 04/23/19  2:15 PM  Result Value Ref Range   ABO/RH(D)      B POS Performed at [name], [place] [address] [place], [place] [address]   Potassium     Status: Abnormal   Collection Time: 04/23/19  4:56 PM  Result Value Ref Range   Potassium 5.9 (H) 3.5 - 5.1 mmol/L    Comment: Performed at [facility] [address], [place], [place] [address]  Lipase, blood     Status: Abnormal   Collection Time: 04/23/19  4:56 PM  Result Value Ref Range   Lipase 85 (H) 11 - 51 U/L    Comment: Performed at [facility] [address], [place], [place] [address]  Glucose, capillary      Status: Abnormal   Collection Time: 04/23/19  5:03 PM  Result Value Ref Range   Glucose-Capillary 259 (H) 70 - 99 mg/dL  Glucose, capillary     Status: Abnormal   Collection Time: 04/23/19  7:48 PM  Result Value Ref Range   Glucose-Capillary 243 (H) 70 - 99 mg/dL  Potassium     Status: None   Collection Time: 04/23/19 10:14 PM  Result Value Ref Range   Potassium 5.0 3.5 - 5.1 mmol/L    Comment: Performed at [facility], [place] [address], [place], [place] [address]  Calcium, ionized     Status: Abnormal   Collection Time: 04/23/19 10:14 PM  Result Value Ref Range   Calcium, Ionized, Serum 3.4 (L) 4.5 - 5.6 mg/dL    Comment: (NOTE) Performed At: [facility] [place], [place] [id number] Rush Farmer MD TA:[id number]   I-STAT 7, (LYTES, BLD  GAS, ICA, H+H)     Status: Abnormal   Collection Time: 04/23/19 10:49 PM  Result Value Ref Range   pH, Arterial 7.123 (LL) 7.350 - 7.450   pCO2 arterial 41.5 32.0 - 48.0 mmHg   pO2, Arterial 133.0 (H) 83.0 - 108.0 mmHg   Bicarbonate 13.5 (L) 20.0 - 28.0 mmol/L   TCO2 15 (L) 22 - 32 mmol/L   O2 Saturation 98.0 %   Acid-base deficit 15.0 (H) 0.0 - 2.0 mmol/L   Sodium 139 135 - 145 mmol/L   Potassium 4.9 3.5 - 5.1 mmol/L   Calcium, Ion 0.93 (L) 1.15 - 1.40 mmol/L   HCT 40.0 39.0 - 52.0 %   Hemoglobin 13.6 13.0 - 17.0 g/dL   Patient temperature 99.1 F    Collection site ARTERIAL LINE    Drawn by RT    Sample type ARTERIAL    Comment NOTIFIED PHYSICIAN   Glucose, capillary     Status: Abnormal   Collection Time: 04/23/19 11:26 PM  Result Value Ref Range   Glucose-Capillary 283 (H) 70 - 99 mg/dL  Potassium     Status: Abnormal   Collection Time: 04/24/19  2:08 AM  Result Value Ref Range   Potassium 5.5 (H) 3.5 - 5.1 mmol/L    Comment: Performed at [facility], [address], [place], [place] [address]  Glucose, capillary     Status: Abnormal   Collection Time: 04/24/19  3:26 AM  Result Value Ref  Range   Glucose-Capillary 233 (H) 70 - 99 mg/dL  .Cooxemetry Panel (carboxy, met, total hgb, O2 sat)     Status: None   Collection Time: 04/24/19  4:16 AM  Result Value Ref Range   Total hemoglobin 13.8 12.0 - 16.0 g/dL   O2 Saturation 83.0 %   Carboxyhemoglobin 0.8 0.5 - 1.5 %   Methemoglobin 0.7 0.0 - 1.5 %    Comment: Performed at [facility], [place] [address], [place], [place] [address]  Procalcitonin     Status: None   Collection Time: 04/24/19  4:35 AM  Result Value Ref Range   Procalcitonin 26.87 ng/mL    Comment:        Interpretation: PCT >= 10 ng/mL: Important systemic inflammatory response, almost exclusively due to severe bacterial sepsis or septic shock. (NOTE)       Sepsis PCT Algorithm           Lower Respiratory Tract                                      Infection PCT Algorithm    ----------------------------     ----------------------------         PCT < 0.25 ng/mL                PCT < 0.10 ng/mL         Strongly encourage             Strongly discourage   discontinuation of antibiotics    initiation of antibiotics    ----------------------------     -----------------------------       PCT 0.25 - 0.50 ng/mL            PCT 0.10 - 0.25 ng/mL               OR       >80% decrease in PCT  Discourage initiation of                                            antibiotics      Encourage discontinuation           of antibiotics    ----------------------------     -----------------------------         PCT >= 0.50 ng/mL              PCT 0.26 - 0.50 ng/mL                AND       <80% decrease in PCT             Encourage initiation of                                             antibiotics       Encourage continuation           of antibiotics    ----------------------------     -----------------------------        PCT >= 0.50 ng/mL                  PCT > 0.50 ng/mL               AND         increase in PCT                  Strongly encourage                                       initiation of antibiotics    Strongly encourage escalation           of antibiotics                                     -----------------------------                                           PCT <= 0.25 ng/mL                                                 OR                                        > 80% decrease in PCT                                     Discontinue / Do not initiate  antibiotics Performed at [facility], [place] [address], [place], [place] [address]   Renal function panel (daily at 0500)     Status: Abnormal   Collection Time: 04/24/19  4:35 AM  Result Value Ref Range   Sodium 138 135 - 145 mmol/L   Potassium 5.7 (H) 3.5 - 5.1 mmol/L   Chloride 98 98 - 111 mmol/L   CO2 20 (L) 22 - 32 mmol/L   Glucose, Bld 276 (H) 70 - 99 mg/dL   BUN 29 (H) 6 - 20 mg/dL   Creatinine, Ser 5.17 (H) 0.61 - 1.24 mg/dL   Calcium 7.1 (L) 8.9 - 10.3 mg/dL   Phosphorus 4.9 (H) 2.5 - 4.6 mg/dL   Albumin 3.0 (L) 3.5 - 5.0 g/dL   GFR calc non Af Amer 13 (L) >60 mL/min   GFR calc Af Amer 16 (L) >60 mL/min   Anion gap 20 (H) 5 - 15    Comment: Performed at [facility], [address] [place], [place] [address]  Magnesium     Status: None   Collection Time: 04/24/19  4:35 AM  Result Value Ref Range   Magnesium 1.9 1.7 - 2.4 mg/dL    Comment: Performed at [facility] [address], [place], [place] [address]  Vancomycin, random     Status: None   Collection Time: 04/24/19  4:35 AM  Result Value Ref Range   Vancomycin Rm 13     Comment:        Random Vancomycin therapeutic range is dependent on dosage and time of specimen collection. A peak range is 20.0-40.0 ug/mL A trough range is 5.0-15.0 ug/mL        Performed at [facility] [address], [place],  [address]   Potassium     Status: Abnormal   Collection Time: 04/24/19  6:14 AM  Result Value Ref Range   Potassium 5.4 (H) 3.5  - 5.1 mmol/L    Comment: Performed at [facility] [address] [place], [place] [address]  I-STAT 7, (LYTES, BLD GAS, ICA, H+H)     Status: Abnormal   Collection Time: 04/24/19  6:33 AM  Result Value Ref Range   pH, Arterial 7.261 (L) 7.350 - 7.450   pCO2 arterial 55.0 (H) 32.0 - 48.0 mmHg   pO2, Arterial 100.0 83.0 - 108.0 mmHg   Bicarbonate 25.0 20.0 - 28.0 mmol/L   TCO2 27 22 - 32 mmol/L   O2 Saturation 97.0 %   Acid-base deficit 3.0 (H) 0.0 - 2.0 mmol/L   Sodium 136 135 - 145 mmol/L   Potassium 5.3 (H) 3.5 - 5.1 mmol/L   Calcium, Ion 0.95 (L) 1.15 - 1.40 mmol/L   HCT 40.0 39.0 - 52.0 %   Hemoglobin 13.6 13.0 - 17.0 g/dL   Patient temperature 36.2 C    Collection site ARTERIAL LINE    Drawn by Nurse    Sample type ARTERIAL   Glucose, capillary     Status: Abnormal   Collection Time: 04/24/19  8:21 AM  Result Value Ref Range   Glucose-Capillary 213 (H) 70 - 99 mg/dL   Comment 1 Notify RN    Comment 2 Document in Chart   CBC with Differential/Platelet     Status: Abnormal   Collection Time: 04/24/19  8:43 AM  Result Value Ref Range   WBC 17.6 (H) 4.0 - 10.5 K/uL   RBC 4.31 4.22 - 5.81 MIL/uL  Hemoglobin 13.7 13.0 - 17.0 g/dL   HCT 39.8 39.0 - 52.0 %   MCV 92.3 80.0 - 100.0 fL   MCH 31.8 26.0 - 34.0 pg   MCHC 34.4 30.0 - 36.0 g/dL   RDW 12.3 11.5 - 15.5 %   Platelets 137 (L) 150 - 400 K/uL   nRBC 0.0 0.0 - 0.2 %   Neutrophils Relative % 90 %   Neutro Abs 15.8 (H) 1.7 - 7.7 K/uL   Lymphocytes Relative 4 %   Lymphs Abs 0.8 0.7 - 4.0 K/uL   Monocytes Relative 3 %   Monocytes Absolute 0.5 0.1 - 1.0 K/uL   Eosinophils Relative 2 %   Eosinophils Absolute 0.4 0.0 - 0.5 K/uL   Basophils Relative 0 %   Basophils Absolute 0.0 0.0 - 0.1 K/uL   Immature Granulocytes 1 %   Abs Immature Granulocytes 0.11 (H) 0.00 - 0.07 K/uL    Comment: Performed at [facility] [address], [place], [place] [address]  Comprehensive metabolic panel     Status: Abnormal    Collection Time: 04/24/19  8:43 AM  Result Value Ref Range   Sodium 139 135 - 145 mmol/L   Potassium 5.3 (H) 3.5 - 5.1 mmol/L   Chloride 98 98 - 111 mmol/L   CO2 27 22 - 32 mmol/L   Glucose, Bld 206 (H) 70 - 99 mg/dL   BUN 28 (H) 6 - 20 mg/dL   Creatinine, Ser 4.77 (H) 0.61 - 1.24 mg/dL   Calcium 7.1 (L) 8.9 - 10.3 mg/dL   Total Protein 6.1 (L) 6.5 - 8.1 g/dL   Albumin 2.9 (L) 3.5 - 5.0 g/dL   AST >10,000 (H) 15 - 41 U/L    Comment: RESULTS CONFIRMED BY MANUAL DILUTION   ALT 7,495 (H) 0 - 44 U/L    Comment: RESULTS CONFIRMED BY MANUAL DILUTION   Alkaline Phosphatase 101 38 - 126 U/L   Total Bilirubin 2.6 (H) 0.3 - 1.2 mg/dL   GFR calc non Af Amer 15 (L) >60 mL/min   GFR calc Af Amer 17 (L) >60 mL/min   Anion gap 14 5 - 15    Comment: Performed at [facility], [place] [address] [place], [place] [address]  Protime-INR     Status: Abnormal   Collection Time: 04/24/19  8:43 AM  Result Value Ref Range   Prothrombin Time 20.3 (H) 11.4 - 15.2 seconds   INR 1.8 (H) 0.8 - 1.2    Comment: (NOTE) INR goal varies based on device and disease states. Performed at [facility], [place] [address], [place], [place] [address]   Fibrinogen     Status: None   Collection Time: 04/24/19  8:43 AM  Result Value Ref Range   Fibrinogen 306 210 - 475 mg/dL    Comment: Performed at [facility] [address], [place], [place] [address]  Lactic acid, plasma     Status: Abnormal   Collection Time: 04/24/19  8:43 AM  Result Value Ref Range   Lactic Acid, Venous 4.3 (HH) 0.5 - 1.9 mmol/L    Comment: CRITICAL VALUE NOTED.  VALUE IS CONSISTENT WITH PREVIOUSLY REPORTED AND CALLED VALUE. Performed at [facility], [place] [address] [place], [place] [address]   I-STAT 7, (LYTES, BLD GAS, ICA, H+H)     Status: Abnormal   Collection Time: 04/24/19  9:11 AM  Result Value Ref Range   pH, Arterial 7.275 (L)  7.350 - 7.450   pCO2 arterial 61.8 (H) 32.0 - 48.0 mmHg   pO2, Arterial 114.0 (H)  83.0 - 108.0 mmHg   Bicarbonate 28.7 (H) 20.0 - 28.0 mmol/L   TCO2 31 22 - 32 mmol/L   O2 Saturation 98.0 %   Sodium 137 135 - 145 mmol/L   Potassium 5.3 (H) 3.5 - 5.1 mmol/L   Calcium, Ion 0.93 (L) 1.15 - 1.40 mmol/L   HCT 40.0 39.0 - 52.0 %   Hemoglobin 13.6 13.0 - 17.0 g/dL   Patient temperature 98.6 F    Collection site RADIAL, ALLEN'S TEST ACCEPTABLE    Drawn by RT    Sample type ARTERIAL   Lactic acid, plasma     Status: Abnormal   Collection Time: 04/24/19 11:59 AM  Result Value Ref Range   Lactic Acid, Venous 3.5 (HH) 0.5 - 1.9 mmol/L    Comment: CRITICAL VALUE NOTED.  VALUE IS CONSISTENT WITH PREVIOUSLY REPORTED AND CALLED VALUE. Performed at [facility], [place] [address], [place], [place] [address]   Glucose, capillary     Status: Abnormal   Collection Time: 04/24/19 12:55 PM  Result Value Ref Range   Glucose-Capillary 177 (H) 70 - 99 mg/dL  Potassium     Status: Abnormal   Collection Time: 04/24/19  1:07 PM  Result Value Ref Range   Potassium 5.5 (H) 3.5 - 5.1 mmol/L    Comment: Performed at [facility], [place] [address] [place], [place] [address]  Glucose, capillary     Status: Abnormal   Collection Time: 04/24/19  4:07 PM  Result Value Ref Range   Glucose-Capillary 158 (H) 70 - 99 mg/dL   Comment 1 Notify RN    Comment 2 Document in Chart   I-STAT 7, (LYTES, BLD GAS, ICA, H+H)     Status: Abnormal   Collection Time: 04/24/19  4:11 PM  Result Value Ref Range   pH, Arterial 7.353 7.350 - 7.450   pCO2 arterial 52.4 (H) 32.0 - 48.0 mmHg   pO2, Arterial 84.0 83.0 - 108.0 mmHg   Bicarbonate 29.1 (H) 20.0 - 28.0 mmol/L   TCO2 31 22 - 32 mmol/L   O2 Saturation 95.0 %   Acid-Base Excess 3.0 (H) 0.0 - 2.0 mmol/L   Sodium 137 135 - 145 mmol/L   Potassium 4.0 3.5 - 5.1 mmol/L   Calcium, Ion 0.95 (L) 1.15 - 1.40 mmol/L   HCT 37.0 (L) 39.0 - 52.0 %   Hemoglobin 12.6 (L) 13.0 - 17.0 g/dL   Patient temperature 37.0 C    Sample type ARTERIAL   Renal function  panel (daily at 1600)     Status: Abnormal   Collection Time: 04/24/19  5:08 PM  Result Value Ref Range   Sodium 139 135 - 145 mmol/L   Potassium 4.3 3.5 - 5.1 mmol/L   Chloride 98 98 - 111 mmol/L   CO2 26 22 - 32 mmol/L   Glucose, Bld 159 (H) 70 - 99 mg/dL   BUN 28 (H) 6 - 20 mg/dL   Creatinine, Ser 4.32 (H) 0.61 - 1.24 mg/dL   Calcium 7.3 (L) 8.9 - 10.3 mg/dL   Phosphorus 4.5 2.5 - 4.6 mg/dL   Albumin 2.8 (L) 3.5 - 5.0 g/dL   GFR calc non Af Amer 17 (L) >60 mL/min   GFR calc Af Amer 19 (L) >60 mL/min   Anion gap 15 5 - 15    Comment: Performed at [facility], [place] [address] [place], [place] [address]  Magnesium     Status: None   Collection Time: 04/24/19  5:08 PM  Result Value Ref Range   Magnesium 1.9 1.7 - 2.4 mg/dL    Comment: Performed at [facility], [place] [address], [place], [place] [address]  Glucose, capillary     Status: Abnormal   Collection Time: 04/24/19  8:21 PM  Result Value Ref Range   Glucose-Capillary 152 (H) 70 - 99 mg/dL  Potassium     Status: None   Collection Time: 04/24/19  8:23 PM  Result Value Ref Range   Potassium 4.6 3.5 - 5.1 mmol/L    Comment: Performed at [facility], [place] [address] [place], [place] [address]  Potassium     Status: None   Collection Time: 04/24/19 11:15 PM  Result Value Ref Range   Potassium 4.4 3.5 - 5.1 mmol/L    Comment: Performed at [facility] [address], [place], [place] [address]  Glucose, capillary     Status: Abnormal   Collection Time: 04/24/19 11:21 PM  Result Value Ref Range   Glucose-Capillary 180 (H) 70 - 99 mg/dL  .Cooxemetry Panel (carboxy, met, total hgb, O2 sat)     Status: Abnormal   Collection Time: 04/25/19  3:32 AM  Result Value Ref Range   Total hemoglobin 12.7 12.0 - 16.0 g/dL   O2 Saturation 85.0 %   Carboxyhemoglobin 1.6 (H) 0.5 - 1.5 %   Methemoglobin 1.0 0.0 - 1.5 %    Comment: Performed at [facility] [address] [place], [place] [address]    Glucose, capillary     Status: Abnormal   Collection Time: 04/25/19  3:47 AM  Result Value Ref Range   Glucose-Capillary 162 (H) 70 - 99 mg/dL  Procalcitonin     Status: None   Collection Time: 04/25/19  3:58 AM  Result Value Ref Range   Procalcitonin 28.31 ng/mL    Comment:        Interpretation: PCT >= 10 ng/mL: Important systemic inflammatory response, almost exclusively due to severe bacterial sepsis or septic shock. (NOTE)       Sepsis PCT Algorithm           Lower Respiratory Tract                                      Infection PCT Algorithm    ----------------------------     ----------------------------         PCT < 0.25 ng/mL                PCT < 0.10 ng/mL         Strongly encourage             Strongly discourage   discontinuation of antibiotics    initiation of antibiotics    ----------------------------     -----------------------------       PCT 0.25 - 0.50 ng/mL            PCT 0.10 - 0.25 ng/mL               OR       >80% decrease in PCT            Discourage initiation of  antibiotics      Encourage discontinuation           of antibiotics    ----------------------------     -----------------------------         PCT >= 0.50 ng/mL              PCT 0.26 - 0.50 ng/mL                AND       <80% decrease in PCT             Encourage initiation of                                             antibiotics       Encourage continuation           of antibiotics    ----------------------------     -----------------------------        PCT >= 0.50 ng/mL                  PCT > 0.50 ng/mL               AND         increase in PCT                  Strongly encourage                                      initiation of antibiotics    Strongly encourage escalation           of antibiotics                                     -----------------------------                                           PCT <= 0.25 ng/mL                                                  OR                                        > 80% decrease in PCT                                     Discontinue / Do not initiate                                             antibiotics Performed at [facility], [place] [address], [place], [place] [address]   Renal function panel (daily at 0500)     Status: Abnormal  Collection Time: 04/25/19  3:58 AM  Result Value Ref Range   Sodium 138 135 - 145 mmol/L   Potassium 4.2 3.5 - 5.1 mmol/L   Chloride 93 (L) 98 - 111 mmol/L   CO2 26 22 - 32 mmol/L   Glucose, Bld 171 (H) 70 - 99 mg/dL   BUN 31 (H) 6 - 20 mg/dL   Creatinine, Ser 4.09 (H) 0.61 - 1.24 mg/dL   Calcium 7.7 (L) 8.9 - 10.3 mg/dL   Phosphorus 4.0 2.5 - 4.6 mg/dL   Albumin 2.9 (L) 3.5 - 5.0 g/dL   GFR calc non Af Amer 18 (L) >60 mL/min   GFR calc Af Amer 21 (L) >60 mL/min   Anion gap 19 (H) 5 - 15    Comment: Performed at [facility], [address] [place], [place] [address]  Magnesium     Status: None   Collection Time: 04/25/19  3:58 AM  Result Value Ref Range   Magnesium 2.1 1.7 - 2.4 mg/dL    Comment: Performed at [facility] [address], [place], [place] [address]  CBC     Status: Abnormal   Collection Time: 04/25/19  3:58 AM  Result Value Ref Range   WBC 15.6 (H) 4.0 - 10.5 K/uL   RBC 4.08 (L) 4.22 - 5.81 MIL/uL   Hemoglobin 12.5 (L) 13.0 - 17.0 g/dL   HCT 36.5 (L) 39.0 - 52.0 %   MCV 89.5 80.0 - 100.0 fL   MCH 30.6 26.0 - 34.0 pg   MCHC 34.2 30.0 - 36.0 g/dL   RDW 12.1 11.5 - 15.5 %   Platelets 124 (L) 150 - 400 K/uL    Comment: REPEATED TO VERIFY   nRBC 0.3 (H) 0.0 - 0.2 %    Comment: Performed at [facility], [place] [address] [place] [address] [place], [place] [address]  Glucose, capillary     Status: Abnormal   Collection Time: 04/25/19  8:11 AM  Result Value Ref Range   Glucose-Capillary 164 (H) 70 - 99 mg/dL   Comment 1 Notify RN    Comment 2 Document in Chart     ECG   N/A  Telemetry   SR, ST, PVCs &  pairs, 3 bt salvos - Personally Reviewed  Radiology    US Abdomen Complete  Result Date: 04/23/2019 CLINICAL DATA:  34 year old male with elevated LFTs EXAM: ABDOMEN ULTRASOUND COMPLETE COMPARISON:  Hepatic liver Doppler examination performed concurrently. FINDINGS: Gallbladder: No gallstones or wall thickening visualized. No sonographic Murphy sign noted by sonographer. Common bile duct: Diameter: Normal at 3 mm Liver: The liver is diffusely heterogeneous and predominantly echogenic. Additionally, there is coarsening of the echotexture. No discrete lesion identified. Portal vein is patent on color Doppler imaging with normal direction of blood flow towards the liver. IVC: No abnormality visualized. Pancreas: Not well seen due to overlying bowel gas. Spleen: Size and appearance within normal limits. Right Kidney: Length: 12.3 cm. Echogenicity within normal limits. No mass or hydronephrosis visualized. Left Kidney: Length: 12.5 cm. Echogenicity within normal limits. No mass or hydronephrosis visualized. Abdominal aorta: No aneurysm visualized. Other findings: None. IMPRESSION: 1. Diffusely heterogeneous and echogenic hepatic parenchyma. While nonspecific, the imaging appearance is most suggestive of hepatic steatosis. 2. No evidence of cholelithiasis or biliary ductal dilatation. Electronically Signed   By: Jacqulynn Cadet M.D.   On: 04/23/2019 14:54   DG Chest Port 1 View  Result Date: 04/23/2019 CLINICAL DATA:  Central line placement EXAM:  PORTABLE CHEST 1 VIEW COMPARISON:  04/23/2019 FINDINGS: Interval placement of a right neck vascular sheath, tip positioned over the right brachiocephalic vein. Left neck vascular catheter remains in position, tip over the mid SVC. Endotracheal tube and esophagogastric tube in unchanged position. Slight interval increase in diffuse bilateral interstitial and heterogeneous airspace opacity. Cardiomegaly. IMPRESSION: 1. Interval placement of a right neck vascular sheath,  tip positioned over the right brachiocephalic vein. 2.  Other support apparatus in unchanged position. 3. Slight interval increase in diffuse bilateral interstitial and heterogeneous airspace opacity, consistent with multifocal infection, edema, and/or ARDS. 4.  Cardiomegaly. Electronically Signed   By: Eddie Candle M.D.   On: 04/23/2019 16:36   IR PATIENT EVAL TECH 0-60 MINS  Result Date: 04/23/2019 Darnelle Spangle     04/23/2019 10:29 AM 17f sheath pulled from right groin using manual pressure and quikclot hemostasis gauze at 0938 hrs.  Hemostasis achieved at 1003 hrs.  Groin reviewed with patient's RN JAnderson Malta  PT pulse on right side is dopplerable.  Unable to palpate or doppler DP pulse.  RN JAnderson Maltaaware of pedal pulse situation on right side.  Quikclot hemostasis gauze needs to be removed no later than 1000 hrs 04/24/2019.  [place]IVER DOPPLER  Result Date: 04/23/2019 CLINICAL DATA:  34year old male currently intubated and ventilated with elevated LFTs. EXAM: DUPLEX ULTRASOUND OF LIVER TECHNIQUE: Color and duplex Doppler ultrasound was performed to evaluate the hepatic in-flow and out-flow vessels. COMPARISON:  None. Concurrently obtained abdominal ultrasound FINDINGS: Liver: Diffusely heterogeneous hepatic parenchyma which is coarsened in echotexture and predominantly echogenic. Normal hepatic contour without nodularity. Main Portal Vein size: 1 cm Portal Vein Velocities Main Prox:  48 cm/sec with normal hepatopetal flow Main Mid: 48 cm/sec Main Dist:  49 cm/sec Right: 55 cm/sec with normal hepatopetal flow Left: 56 cm/sec with normal hepatopetal flow Hepatic Vein Velocities Right:  44 cm/sec Middle:  132 cm/sec Left:  127 cm/sec IVC: Present and patent with normal respiratory phasicity. Hepatic Artery Velocity:  185 cm/sec Splenic Vein Velocity:  80 cm/sec Spleen: 5.9 x 4.4 x 10.1 cm with a total volume of 136 cm^3 (411 cm^3 is upper limit normal) Portal Vein Occlusion/Thrombus: No Splenic Vein  Occlusion/Thrombus: No Ascites: None Varices: None IMPRESSION: 1. Widely patent portal veins with normal hepatopetal directional flow. 2. No evidence of hepatic venous thrombosis. Electronically Signed   By: HJacqulynn CadetM.D.   On: 04/23/2019 15:03    Cardiac Studies   ECHO: 04/24/2019 1. Left ventricular ejection fraction, by visual estimation, is <20%. The  left ventricle has severely decreased function. There is no left  ventricular hypertrophy.  2. Apex is not well-visualized. In setting of stroke with severe LV  systolic dysfunction, recommend repeat TTE with contrast to rule out LV  apical thrombus  3. Severely dilated left ventricular internal cavity size.  4. Left ventricular diastolic parameters are consistent with Grade III  diastolic dysfunction (restrictive).  5. Elevated left atrial pressure.  6. The left ventricle demonstrates global hypokinesis.  7. Global right ventricle has normal systolic function.The right  ventricular size is mildly enlarged.  8. Left atrial size was moderately dilated.  9. Right atrial size was normal.  10. The mitral valve is normal in structure. Mild mitral valve  regurgitation.  11. The tricuspid valve is normal in structure. Tricuspid valve  regurgitation is trivial.  12. The aortic valve is normal in structure. Aortic valve regurgitation is  not visualized. No evidence of aortic valve sclerosis or  stenosis.  13. The pulmonic valve was not well visualized. Pulmonic valve  regurgitation is not visualized.    Assessment and Plan   1. Shock - believe predominantly septic - at one point, on Epi, Norepi, phenylephrine and Vasopressin gtts to maintain BP - currently on Norepi only, at 5 mcg/min, Vasopressin being d/c'd this am - Neuro requests SBP >/= 110 -  [place] 85 today, was 83 yesterday - metabolic acidosis is improving  2. ARF - BUN/Cr 17/1.17 on admit - peak 29/5.17, now on CVVH w/ Cr 4.09  3. Shock liver - initial  LFTs ok, then AST>10,000 & ALT 11,085  - 02/07 AST > 10,000 & ALT 7,495 - CK 602 - GI has seen, continue supportive care  4. CVA - s/p TPA and thrombectomy by IR - Neuro following - concern that cause was apical thrombus, apex not well seen on initial echo - will order limited echo w/ contrast to look for apical thrombus  5. Elevated troponin, hx CHF - 06/2018 EF not significantly different from 04/21/2019 - hsTrop 16,274>>14,226 - volume mgt per Nephrology, on CVVH - MD advise if any further eval needed  Active Problems:   Occlusion of left middle cerebral artery   Middle cerebral artery embolism, left   Cerebrovascular accident (CVA) due to thrombosis of left middle cerebral artery ([place])   Shock ([place])   Shock liver   Respiratory failure, acute ([place])  >30" w/ pt and chart, > 50% w/ patient  Length of Stay:  LOS: 3 days   Rhonda Barrett 04/25/2019, 9:28 AM

## 2019-04-25 NOTE — Progress Notes (Signed)
  Echocardiogram 2D Echocardiogram has been performed.  04/25/2019, 11:20 AM

## 2019-04-25 NOTE — Progress Notes (Signed)
PT Cancellation Note  Patient Details Name: Carlos Brewer. MRN: [id number] DOB: [birth year]   Cancelled Treatment:    Reason Eval/Treat Not Completed: Medical issues which prohibited therapy; patient remains on CRRT and not ready to initiate PT per RN.  Will sign off and anticipate new orders when pt appropriate.  Thanks.   Reginia Naas 04/25/2019, 1:00 PM  Magda Kiel, [place] [phone] 04/25/2019

## 2019-04-25 NOTE — Progress Notes (Signed)
Patient ID: Carlos Brewer., male   DOB: September 17, 1985, 34 y.o.   MRN: [id number] [facility] Progress Note   Assessment/ Plan:   1. Acute kidney Injury: Anuric.  Consistent with ATN with ischemic/nephrotoxic etiologies and exacerbated by cardiogenic shock.  Continue current CRRT prescription as he does not have any evidence of renal recovery. 2.  Shock: Cardiogenic versus septic with data so far favoring the latter (pulmonary versus urinary etiology).  On meropenem and vancomycin.  Remains pressor dependent on Levophed and vasopressin.  With evidence of shock liver. 3.  Acute CVA from right MCA occlusion: Status post TPA/thrombectomy by IR. 4.  Acute hypoxic respiratory failure: Remains ventilator dependent with attempts to correct underlying metabolic acidosis with CRRT/bicarbonate.  Subjective:   Without acute events overnight.  CRRT filter clotted once at shift change.  Hematuria in urine collection bag.   Objective:   BP 113/82   Pulse 69   Temp 98.8 F (37.1 C)   Resp (!) 32   Ht _0  (1.854 m)   Wt 128 kg   SpO2 100%   BMI 37.23 kg/m   Intake/Output Summary (Last 24 hours) at 04/25/2019 4854 Last data filed at 04/25/2019 0800 Gross per 24 hour  Intake 1465.15 ml  Output 4691 ml  Net -3225.85 ml   Weight change: -5 kg  Physical Exam: Gen: Intubated, sedated, unresponsive CVS: Pulse regular rhythm, normal rate, S1 and S2 normal Resp: Anteriorly transmitted breath sounds on ventilator. Abd: Soft, obese, nontender Ext: Trace lower extremity edema  Imaging: US Abdomen Complete  Result Date: 04/23/2019 CLINICAL DATA:  34 year old male with elevated LFTs EXAM: ABDOMEN ULTRASOUND COMPLETE COMPARISON:  Hepatic liver Doppler examination performed concurrently. FINDINGS: Gallbladder: No gallstones or wall thickening visualized. No sonographic Murphy sign noted by sonographer. Common bile duct: Diameter: Normal at 3 mm Liver: The liver is diffusely heterogeneous  and predominantly echogenic. Additionally, there is coarsening of the echotexture. No discrete lesion identified. Portal vein is patent on color Doppler imaging with normal direction of blood flow towards the liver. IVC: No abnormality visualized. Pancreas: Not well seen due to overlying bowel gas. Spleen: Size and appearance within normal limits. Right Kidney: Length: 12.3 cm. Echogenicity within normal limits. No mass or hydronephrosis visualized. Left Kidney: Length: 12.5 cm. Echogenicity within normal limits. No mass or hydronephrosis visualized. Abdominal aorta: No aneurysm visualized. Other findings: None. IMPRESSION: 1. Diffusely heterogeneous and echogenic hepatic parenchyma. While nonspecific, the imaging appearance is most suggestive of hepatic steatosis. 2. No evidence of cholelithiasis or biliary ductal dilatation. Electronically Signed   By: Jacqulynn Cadet M.D.   On: 04/23/2019 14:54   DG Chest Port 1 View  Result Date: 04/23/2019 CLINICAL DATA:  Central line placement EXAM: PORTABLE CHEST 1 VIEW COMPARISON:  04/23/2019 FINDINGS: Interval placement of a right neck vascular sheath, tip positioned over the right brachiocephalic vein. Left neck vascular catheter remains in position, tip over the mid SVC. Endotracheal tube and esophagogastric tube in unchanged position. Slight interval increase in diffuse bilateral interstitial and heterogeneous airspace opacity. Cardiomegaly. IMPRESSION: 1. Interval placement of a right neck vascular sheath, tip positioned over the right brachiocephalic vein. 2.  Other support apparatus in unchanged position. 3. Slight interval increase in diffuse bilateral interstitial and heterogeneous airspace opacity, consistent with multifocal infection, edema, and/or ARDS. 4.  Cardiomegaly. Electronically Signed   By: Eddie Candle M.D.   On: 04/23/2019 16:36   IR PATIENT EVAL TECH 0-60 MINS  Result Date: 04/23/2019 [place] General  A     04/23/2019 10:29 AM 89f sheath pulled  from right groin using manual pressure and quikclot hemostasis gauze at 0938 hrs.  Hemostasis achieved at 1003 hrs.  Groin reviewed with patient's RN JAnderson Malta  PT pulse on right side is dopplerable.  Unable to palpate or doppler DP pulse.  RN JAnderson Maltaaware of pedal pulse situation on right side.  Quikclot hemostasis gauze needs to be removed no later than 1000 hrs 04/24/2019.  [place]IVER DOPPLER  Result Date: 04/23/2019 CLINICAL DATA:  34year old male currently intubated and ventilated with elevated LFTs. EXAM: DUPLEX ULTRASOUND OF LIVER TECHNIQUE: Color and duplex Doppler ultrasound was performed to evaluate the hepatic in-flow and out-flow vessels. COMPARISON:  None. Concurrently obtained abdominal ultrasound FINDINGS: Liver: Diffusely heterogeneous hepatic parenchyma which is coarsened in echotexture and predominantly echogenic. Normal hepatic contour without nodularity. Main Portal Vein size: 1 cm Portal Vein Velocities Main Prox:  48 cm/sec with normal hepatopetal flow Main Mid: 48 cm/sec Main Dist:  49 cm/sec Right: 55 cm/sec with normal hepatopetal flow Left: 56 cm/sec with normal hepatopetal flow Hepatic Vein Velocities Right:  44 cm/sec Middle:  132 cm/sec Left:  127 cm/sec IVC: Present and patent with normal respiratory phasicity. Hepatic Artery Velocity:  185 cm/sec Splenic Vein Velocity:  80 cm/sec Spleen: 5.9 x 4.4 x 10.1 cm with a total volume of 136 cm^3 (411 cm^3 is upper limit normal) Portal Vein Occlusion/Thrombus: No Splenic Vein Occlusion/Thrombus: No Ascites: None Varices: None IMPRESSION: 1. Widely patent portal veins with normal hepatopetal directional flow. 2. No evidence of hepatic venous thrombosis. Electronically Signed   By: HJacqulynn CadetM.D.   On: 04/23/2019 15:03    Labs: BDIRECTVRecent Labs  Lab 05/15/2019 0355 04/27/2019 0355 05/08/2019 0430 05/12/2019 0905 04/23/19 0002 04/23/19 0350002/06/21 0945 04/23/19 1022 04/24/19 0435 04/24/19 0938102/07/21 0829902/07/21 03716 04/24/19 0843 04/24/19 0843 04/24/19 0911 04/24/19 1307 04/24/19 1611 04/24/19 1708 04/24/19 2023 04/24/19 2315 04/25/19 0358  NA 136   < > 136   < > 139  --  140   < > 138  --  136  --  139  --  137  --  137 139  --   --  138  K 3.4*   < > 3.4*   < > 5.0   < > 4.6   < > 5.7*   < > 5.3*   < > 5.3*   < > 5.3* 5.5* 4.0 4.3 4.6 4.4 4.2  CL 98   < > 102  --  109  --  109  --  98  --   --   --  98  --   --   --   --  98  --   --  93*  CO2 24  --   --   --  15*  --  13*  --  20*  --   --   --  27  --   --   --   --  26  --   --  26  GLUCOSE 153*   < > 149*  --  220*  --  229*  --  276*  --   --   --  206*  --   --   --   --  159*  --   --  171*  BUN 17   < > 17  --  23*  --  28*  --  29*  --   --   --  28*  --   --   --   --  28*  --   --  31*  CREATININE 1.17   < > 1.10  --  2.64*  --  4.21*  --  5.17*  --   --   --  4.77*  --   --   --   --  4.32*  --   --  4.09*  CALCIUM 9.5  --   --   --  7.7*  --  7.5*  --  7.1*  --   --   --  7.1*  --   --   --   --  7.3*  --   --  7.7*  PHOS  --   --   --   --   --   --   --   --  4.9*  --   --   --   --   --   --   --   --  4.5  --   --  4.0   < > = values in this interval not displayed.   CBC Recent Labs  Lab 05/14/2019 0355 04/19/2019 0430 04/23/19 0208 04/23/19 0208 04/23/19 0945 04/23/19 1022 04/24/19 0843 04/24/19 0911 04/24/19 1611 04/25/19 0358  WBC 7.2   < > 21.9*  --  16.2*  --  17.6*  --   --  15.6*  NEUTROABS 4.3  --  19.2*  --  13.5*  --  15.8*  --   --   --   HGB 14.9   < > 15.4   < > 14.6   < > 13.7 13.6 12.6* 12.5*  HCT 42.8   < > 45.2   < > 44.0   < > 39.8 40.0 37.0* 36.5*  MCV 89.0   < > 90.8  --  92.6  --  92.3  --   --  89.5  PLT 243   < > 171  --  145*  --  137*  --   --  124*   < > = values in this interval not displayed.    Medications:    . sodium chloride   Intravenous Once  . chlorhexidine gluconate (MEDLINE KIT)  15 mL Mouth Rinse BID  . Chlorhexidine Gluconate Cloth  6 each Topical Q0600  . fentaNYL (SUBLIMAZE)  injection  50 mcg Intravenous Once  . FLUoxetine  20 mg Per Tube Daily  . hydrocortisone sod succinate (SOLU-CORTEF) inj  50 mg Intravenous Q6H  . insulin aspart  0-20 Units Subcutaneous Q4H  . mouth rinse  15 mL Mouth Rinse 10 times per day  . mupirocin ointment  1 application Nasal BID  . pantoprazole (PROTONIX) IV  40 mg Intravenous Daily  . sodium chloride flush  10-40 mL Intracatheter Q12H   Elmarie Shiley, MD 04/25/2019, 8:32 AM

## 2019-04-25 NOTE — Progress Notes (Signed)
Pharmacist Heart Failure Core Measure Documentation  Assessment: Carlos Brewer. has an EF documented as <20% on 05/04/2019 by Oswaldo Milian.  Rationale: Heart failure patients with left ventricular systolic dysfunction (LVSD) and an EF < 40% should be prescribed an angiotensin converting enzyme inhibitor (ACEI) or angiotensin receptor blocker (ARB) at discharge unless a contraindication is documented in the medical record.  This patient is not currently on an ACEI or ARB for HF.  This note is being placed in the record in order to provide documentation that a contraindication to the use of these agents is present for this encounter.  ACE Inhibitor or Angiotensin Receptor Blocker is contraindicated (specify all that apply)  []   ACEI allergy AND ARB allergy []   Angioedema []   Moderate or severe aortic stenosis []   Hyperkalemia []   Hypotension []   Renal artery stenosis [x]   Worsening renal function, preexisting renal disease or dysfunction   Elicia Lamp, PharmD, BCPS Please check AMION for all [place] contact numbers Clinical Pharmacist 04/25/2019 3:21 PM

## 2019-04-25 NOTE — Progress Notes (Signed)
ANTICOAGULATION CONSULT NOTE - Initial Consult  Pharmacy Consult for heparin Indication: stroke, LV thrombus   No Known Allergies  Patient Measurements: Height: 6\' 1"  (185.4 cm) Weight: 282 lb 3 oz (128 kg) IBW/kg (Calculated) : 79.9 Heparin Dosing Weight: 107 kg  Vital Signs: Temp: 96.4 F (35.8 C) (02/08 1900) BP: 103/72 (02/08 1900) Pulse Rate: 84 (02/08 1900)  Labs: Recent Labs    04/23/19 0945 04/23/19 1022 04/23/19 1122 04/23/19 1237 04/23/19 1356 04/23/19 2249 04/24/19 0843 04/24/19 0843 04/24/19 0911 04/24/19 0911 04/24/19 1611 04/24/19 1708 04/25/19 0358 04/25/19 1600  HGB 14.6   < >  --    < >  --    < > 13.7   < > 13.6   < > 12.6*  --  12.5*  --   HCT 44.0   < >  --    < >  --    < > 39.8   < > 40.0  --  37.0*  --  36.5*  --   PLT 145*  --   --   --   --   --  137*  --   --   --   --   --  124*  --   LABPROT 31.7*  --   --   --   --   --  20.3*  --   --   --   --   --   --   --   INR 3.1*  --   --   --   --   --  1.8*  --   --   --   --   --   --   --   CREATININE 4.21*  --   --   --   --    < > 4.77*   < >  --   --   --  4.32* 4.09* 3.91*  CKTOTAL  --   --   --   --  602*  --   --   --   --   --   --   --   --   --   TROPONINIHS 16,274*  --  14,226*  --   --   --   --   --   --   --   --   --   --   --    < > = values in this interval not displayed.    Estimated Creatinine Clearance: 37.3 mL/min (A) (by C-G formula based on SCr of 3.91 mg/dL (H)).  Assessment: 34 yo m initially presented with aphasia and right sided weaknesss. Found to have left MCA infarct - received TPA and revascularization with IR on 2/5  PMH: NICM EF 15 - 20%, HF, DM  Now found to have LV apical thrombus on ECHO  H&H 12.5/36.5, Plt 124  Goal of Therapy:  Heparin level 0.3 - 0.5 units/ml Monitor platelets by anticoagulation protocol: Yes   Plan:  Heparin gtt 1250 units/hr - no bolus d/t stroke Initial hep lvl 0200 Daily hep lvl cbc  Barth Kirks, PharmD, BCPS,  BCCCP Clinical Pharmacist [phone]  Please check AMION for all [place] numbers  04/25/2019 7:27 PM

## 2019-04-25 NOTE — Progress Notes (Signed)
OT Cancellation Note  Patient Details Name: Carlos Brewer. MRN: [id number] DOB: [birth year]   Cancelled Treatment:    Reason Eval/Treat Not Completed: Patient not medically ready(Intubated and on CRRT. Will return as schedule allows and pt medically ready.)  [place], OTR/L Acute Rehab Pager: [phone] Office: [phone] 04/25/2019, 1:10 PM

## 2019-04-25 NOTE — Progress Notes (Signed)
Assisted tele visit to patient with family member.  Janey Petron R, RN  

## 2019-04-26 ENCOUNTER — Inpatient Hospital Stay (HOSPITAL_COMMUNITY): Payer: No Typology Code available for payment source

## 2019-04-26 DIAGNOSIS — I6602 Occlusion and stenosis of left middle cerebral artery: Secondary | ICD-10-CM

## 2019-04-26 DIAGNOSIS — N179 Acute kidney failure, unspecified: Secondary | ICD-10-CM | POA: Diagnosis not present

## 2019-04-26 DIAGNOSIS — I471 Supraventricular tachycardia: Secondary | ICD-10-CM | POA: Diagnosis not present

## 2019-04-26 DIAGNOSIS — J9601 Acute respiratory failure with hypoxia: Secondary | ICD-10-CM | POA: Diagnosis not present

## 2019-04-26 DIAGNOSIS — Z978 Presence of other specified devices: Secondary | ICD-10-CM | POA: Diagnosis not present

## 2019-04-26 DIAGNOSIS — E785 Hyperlipidemia, unspecified: Secondary | ICD-10-CM | POA: Diagnosis not present

## 2019-04-26 DIAGNOSIS — I472 Ventricular tachycardia: Secondary | ICD-10-CM | POA: Diagnosis not present

## 2019-04-26 DIAGNOSIS — R579 Shock, unspecified: Secondary | ICD-10-CM | POA: Diagnosis not present

## 2019-04-26 DIAGNOSIS — F17200 Nicotine dependence, unspecified, uncomplicated: Secondary | ICD-10-CM

## 2019-04-26 DIAGNOSIS — I255 Ischemic cardiomyopathy: Secondary | ICD-10-CM

## 2019-04-26 DIAGNOSIS — I639 Cerebral infarction, unspecified: Secondary | ICD-10-CM | POA: Diagnosis not present

## 2019-04-26 DIAGNOSIS — A419 Sepsis, unspecified organism: Secondary | ICD-10-CM | POA: Diagnosis not present

## 2019-04-26 DIAGNOSIS — I63312 Cerebral infarction due to thrombosis of left middle cerebral artery: Secondary | ICD-10-CM | POA: Diagnosis not present

## 2019-04-26 LAB — CBC
HCT: 37.2 % — ABNORMAL LOW (ref 39.0–52.0)
Hemoglobin: 12.7 g/dL — ABNORMAL LOW (ref 13.0–17.0)
MCH: 30.9 pg (ref 26.0–34.0)
MCHC: 34.1 g/dL (ref 30.0–36.0)
MCV: 90.5 fL (ref 80.0–100.0)
Platelets: 157 10*3/uL (ref 150–400)
RBC: 4.11 MIL/uL — ABNORMAL LOW (ref 4.22–5.81)
RDW: 12.3 % (ref 11.5–15.5)
WBC: 20.4 10*3/uL — ABNORMAL HIGH (ref 4.0–10.5)
nRBC: 0.6 % — ABNORMAL HIGH (ref 0.0–0.2)

## 2019-04-26 LAB — COOXEMETRY PANEL
Carboxyhemoglobin: 1.1 % (ref 0.5–1.5)
Methemoglobin: 0.7 % (ref 0.0–1.5)
O2 Saturation: 98.4 %
Total hemoglobin: 13.4 g/dL (ref 12.0–16.0)

## 2019-04-26 LAB — COMPREHENSIVE METABOLIC PANEL
ALT: 4801 U/L — ABNORMAL HIGH (ref 0–44)
AST: 2846 U/L — ABNORMAL HIGH (ref 15–41)
Albumin: 2.8 g/dL — ABNORMAL LOW (ref 3.5–5.0)
Alkaline Phosphatase: 104 U/L (ref 38–126)
Anion gap: 14 (ref 5–15)
BUN: 42 mg/dL — ABNORMAL HIGH (ref 6–20)
CO2: 25 mmol/L (ref 22–32)
Calcium: 7.7 mg/dL — ABNORMAL LOW (ref 8.9–10.3)
Chloride: 96 mmol/L — ABNORMAL LOW (ref 98–111)
Creatinine, Ser: 3.64 mg/dL — ABNORMAL HIGH (ref 0.61–1.24)
GFR calc Af Amer: 24 mL/min — ABNORMAL LOW (ref >60)
GFR calc non Af Amer: 21 mL/min — ABNORMAL LOW (ref >60)
Glucose, Bld: 212 mg/dL — ABNORMAL HIGH (ref 70–99)
Potassium: 3.9 mmol/L (ref 3.5–5.1)
Sodium: 135 mmol/L (ref 135–145)
Total Bilirubin: 3.1 mg/dL — ABNORMAL HIGH (ref 0.3–1.2)
Total Protein: 6 g/dL — ABNORMAL LOW (ref 6.5–8.1)

## 2019-04-26 LAB — GLUCOSE, CAPILLARY
Glucose-Capillary: 175 mg/dL — ABNORMAL HIGH (ref 70–99)
Glucose-Capillary: 201 mg/dL — ABNORMAL HIGH (ref 70–99)
Glucose-Capillary: 207 mg/dL — ABNORMAL HIGH (ref 70–99)
Glucose-Capillary: 215 mg/dL — ABNORMAL HIGH (ref 70–99)
Glucose-Capillary: 220 mg/dL — ABNORMAL HIGH (ref 70–99)
Glucose-Capillary: 226 mg/dL — ABNORMAL HIGH (ref 70–99)

## 2019-04-26 LAB — RENAL FUNCTION PANEL
Albumin: 2.9 g/dL — ABNORMAL LOW (ref 3.5–5.0)
Anion gap: 14 (ref 5–15)
BUN: 42 mg/dL — ABNORMAL HIGH (ref 6–20)
CO2: 23 mmol/L (ref 22–32)
Calcium: 8.2 mg/dL — ABNORMAL LOW (ref 8.9–10.3)
Chloride: 99 mmol/L (ref 98–111)
Creatinine, Ser: 3.18 mg/dL — ABNORMAL HIGH (ref 0.61–1.24)
GFR calc Af Amer: 28 mL/min — ABNORMAL LOW (ref >60)
GFR calc non Af Amer: 24 mL/min — ABNORMAL LOW (ref >60)
Glucose, Bld: 231 mg/dL — ABNORMAL HIGH (ref 70–99)
Phosphorus: 4 mg/dL (ref 2.5–4.6)
Potassium: 4.7 mmol/L (ref 3.5–5.1)
Sodium: 136 mmol/L (ref 135–145)

## 2019-04-26 LAB — HEPARIN LEVEL (UNFRACTIONATED)
Heparin Unfractionated: <0.1 IU/mL — ABNORMAL LOW (ref 0.30–0.70)
Heparin Unfractionated: <0.1 IU/mL — ABNORMAL LOW (ref 0.30–0.70)
Heparin Unfractionated: 0.2 IU/mL — ABNORMAL LOW (ref 0.30–0.70)

## 2019-04-26 LAB — MAGNESIUM: Magnesium: 2.6 mg/dL — ABNORMAL HIGH (ref 1.7–2.4)

## 2019-04-26 LAB — PHOSPHORUS: Phosphorus: 4.1 mg/dL (ref 2.5–4.6)

## 2019-04-26 LAB — PROTIME-INR
INR: 1.2 (ref 0.8–1.2)
Prothrombin Time: 14.7 seconds (ref 11.4–15.2)

## 2019-04-26 LAB — TROPONIN I (HIGH SENSITIVITY): Troponin I (High Sensitivity): 2409 ng/L (ref <18)

## 2019-04-26 MED ORDER — VASOPRESSIN 20 UNIT/ML IV SOLN
0.0300 [IU]/min | INTRAVENOUS | Status: DC
  Administered 2019-04-26 – 2019-04-27 (×2): 0.03 [IU]/min via INTRAVENOUS
  Filled 2019-04-26 (×2): qty 2

## 2019-04-26 MED ORDER — AMIODARONE HCL IN DEXTROSE 360-4.14 MG/200ML-% IV SOLN
60.0000 mg/h | INTRAVENOUS | Status: DC
  Administered 2019-04-26 (×2): 60 mg/h via INTRAVENOUS
  Filled 2019-04-26: qty 200

## 2019-04-26 MED ORDER — AMIODARONE LOAD VIA INFUSION
150.0000 mg | Freq: Once | INTRAVENOUS | Status: AC
  Administered 2019-04-26: 03:00:00 150 mg via INTRAVENOUS
  Filled 2019-04-26: qty 83.34

## 2019-04-26 MED ORDER — CALCIUM GLUCONATE 10 % IV SOLN
2.0000 g | Freq: Once | INTRAVENOUS | Status: DC
  Filled 2019-04-26: qty 20

## 2019-04-26 MED ORDER — AMIODARONE IV BOLUS ONLY 150 MG/100ML
150.0000 mg | Freq: Once | INTRAVENOUS | Status: AC
  Administered 2019-04-26: 150 mg via INTRAVENOUS
  Filled 2019-04-26: qty 100

## 2019-04-26 MED ORDER — PANTOPRAZOLE SODIUM 40 MG PO PACK
40.0000 mg | PACK | Freq: Every day | ORAL | Status: DC
  Administered 2019-04-26 – 2019-07-09 (×67): 40 mg
  Filled 2019-04-26 (×70): qty 20

## 2019-04-26 MED ORDER — POTASSIUM CHLORIDE 10 MEQ/50ML IV SOLN: 10.0000 meq | INTRAVENOUS | Status: AC

## 2019-04-26 MED ORDER — INSULIN DETEMIR 100 UNIT/ML ~~LOC~~ SOLN
10.0000 [IU] | Freq: Every day | SUBCUTANEOUS | Status: DC
  Administered 2019-04-26: 10 [IU] via SUBCUTANEOUS
  Filled 2019-04-26 (×2): qty 0.1

## 2019-04-26 MED ORDER — CALCIUM GLUCONATE-NACL 2-0.675 GM/100ML-% IV SOLN
2.0000 g | Freq: Once | INTRAVENOUS | Status: AC
  Administered 2019-04-26: 2000 mg via INTRAVENOUS
  Filled 2019-04-26: qty 100

## 2019-04-26 MED ORDER — AMIODARONE HCL IN DEXTROSE 360-4.14 MG/200ML-% IV SOLN
30.0000 mg/h | INTRAVENOUS | Status: DC
  Administered 2019-04-26 – 2019-05-02 (×13): 30 mg/h via INTRAVENOUS
  Filled 2019-04-26 (×13): qty 200

## 2019-04-26 MED ORDER — AMIODARONE HCL IN DEXTROSE 360-4.14 MG/200ML-% IV SOLN
INTRAVENOUS | Status: AC
  Filled 2019-04-26: qty 200

## 2019-04-26 NOTE — Progress Notes (Signed)
eLink Physician-Brief Progress Note Patient Name: Carlos Brewer. DOB: [birth year] MRN: [id number]   Date of Service  04/26/2019  HPI/Events of Note  Wide complex tachycardia with a transient drop in his blood pressure  eICU Interventions  Calcium, potasium, Phosphorus repletion, reduce UF temporarily to 50 ml, check electrolytes, troponin. Amiodarone 150 mg iv x 1        Oday Ridings U Kalen Neidert 04/26/2019, 2:04 AM

## 2019-04-26 NOTE — Progress Notes (Addendum)
STROKE TEAM PROGRESS NOTE   INTERVAL HISTORY Pt still intubated on CRRT. Eyes spontaneous open, minimal tracking and not following commands. Not moving extremities even on pain. Weaning down on pressors. Afebrile, AST/ALT trending down. Discussed with Dr. Elsworth Soho, will consider MRI brain tomorrow once he is more stable and once between CRRT changing bags.   OBJECTIVE Vitals:   04/27/19 0645 04/27/19 0700 04/27/19 0800 04/27/19 0801  BP: (!) 124/92 (!) 130/92 (!) 126/100 133/84  Pulse: 79 79 85 82  Resp: (!) 24 (!) 24 (!) 21 (!) 24  Temp: 98.6 F (37 C) 98.6 F (37 C) 99 F (37.2 C)   TempSrc:      SpO2: 100% 100% 100% 100%  Weight:      Height:        CBC:  Recent Labs  Lab 04/23/19 0945 04/23/19 1022 04/24/19 0843 04/24/19 0911 04/25/19 0358 04/25/19 0358 04/25/19 2319 04/26/19 0158  WBC 16.2*   < > 17.6*   < > 15.6*  --   --  20.4*  NEUTROABS 13.5*  --  15.8*  --   --   --   --   --   HGB 14.6   < > 13.7   < > 12.5*   < > 11.6* 12.7*  HCT 44.0   < > 39.8   < > 36.5*   < > 34.0* 37.2*  MCV 92.6   < > 92.3   < > 89.5  --   --  90.5  PLT 145*   < > 137*   < > 124*  --   --  157   < > = values in this interval not displayed.    Basic Metabolic Panel:  Recent Labs  Lab 04/26/19 0158 04/26/19 0158 04/26/19 1710 04/27/19 0427  NA 135   < > 136 136  K 3.9   < > 4.7 4.8  CL 96*   < > 99 101  CO2 25   < > 23 21*  GLUCOSE 212*   < > 231* 217*  BUN 42*   < > 42* 44*  CREATININE 3.64*   < > 3.18* 3.10*  CALCIUM 7.7*   < > 8.2* 8.5*  MG 2.6*  --   --  2.7*  PHOS 4.1   < > 4.0 4.1   < > = values in this interval not displayed.   Lipid Panel:     Component Value Date/Time   CHOL 265 (H) 04/23/2019 0327   TRIG 191 (H) 04/23/2019 0327   HDL 24 (L) 04/23/2019 0327   CHOLHDL 11.0 04/23/2019 0327   VLDL 38 04/23/2019 0327   LDLCALC 203 (H) 04/23/2019 0327   HgbA1c:  Lab Results  Component Value Date   HGBA1C 6.5 (H) 04/23/2019   Urine Drug Screen:     Component  Value Date/Time   LABOPIA NONE DETECTED 04/23/2019 0930   COCAINSCRNUR NONE DETECTED 04/23/2019 0930   COCAINSCRNUR NONE DETECTED 02/04/2018 1010   LABBENZ POSITIVE (A) 04/23/2019 0930   AMPHETMU NONE DETECTED 04/23/2019 0930   THCU NONE DETECTED 04/23/2019 0930   LABBARB NONE DETECTED 04/23/2019 0930    Alcohol Level     Component Value Date/Time   ETH <10 05/11/2019 0355    IMAGING CT HEAD CODE STROKE WO CONTRAST 04/25/2019 1. Asymmetric hyperdensity of a Left MCA branch in the Sylvian fissure suspicious for emergent large vessel occlusion in the setting.  2. No associated acute cortically based infarct, ASPECTS 10.  No acute hemorrhage.  3. Chronic right frontal operculum encephalomalacia.   CT Code Stroke CTA Head W/WO contrast CT Code Stroke CTA Neck W/WO contrast 05/05/2019 1. Positive for emergent large vessel occlusion at the Left MCA bifurcation. There is reconstitution of the dominant posterior division branch.  2. Elsewhere negative CTA head and neck. Dominant right vertebral artery which supplies the basilar. Fetal type PCA origins.   CT CEREBRAL PERFUSION W CONTRAST 04/24/2019 1. CTP detects evidence of Left MCA territory oligemia concordant with the CTA findings.  2. No infarct core detected with standard CBF <30%.  3. 60 mL of penumbra detected with standard T-max > 6s.   CT HEAD WO CONTRAST 04/23/2019 Small region of loss of gray-white differentiation in the left frontal operculum consistent with a fairly small area of acute infarction compared to the previous perfusion abnormality. No hemorrhage or mass effect. Areas of low-density in the cerebellum that I favor are artifactual. There did not seem to be any abnormalities in that region on the previous examinations.   MRI Head WO Contrast - pending  DG CHEST PORT 1 VIEW 04/26/2019 0854 1. Lines and tubes as described above. 2. Cardiomegaly with basilar airspace disease, likely associated with effusions. 3. Interstitial  and alveolar opacities elsewhere may represent a combination of infection and edema and are little changed. 04/25/2019 0946 Overall improved aeration from the prior exam although persistent opacities and small effusions are noted. 04/23/19 1636 1. Interval placement of a right neck vascular sheath, tip positioned over the right brachiocephalic vein. 2.  Other support apparatus in unchanged position. 3. Slight interval increase in diffuse bilateral interstitial and heterogeneous airspace opacity, consistent with multifocal infection, edema, and/or ARDS. 4.  Cardiomegaly. 04/23/2019 0757 Increasing patchy bilateral airspace opacities which may represent edema or infection. 04/26/2019 1958 1. Interval placement of neck vascular catheter, tip positioned near the superior cavoatrial junction.  2. Interval retraction of endotracheal tube, now positioned just above the thoracic inlet, approximately 6.5 cm above the carina.  3. Interval placement of esophagogastric tube, tip and side port below the diaphragm. 4. Stable cardiomegaly and diffuse bilateral interstitial airspace opacity, likely edema.  04/24/2019 0841 1. Endotracheal tube tip noted just above the carina. Proximal repositioning of 2-3 should be considered. 2. Cardiomegaly again noted. Diffuse bilateral pulmonary infiltrates/edema. Small left pleural effusion.   ECHOCARDIOGRAM COMPLETE 05/14/2019 1. Left ventricular ejection fraction, by visual estimation, is <20%. The left ventricle has severely decreased function. There is no left ventricular hypertrophy.   2. Apex is not well-visualized. In setting of stroke with severe LV systolic dysfunction, recommend repeat TTE with contrast to rule out LV apical thrombus   3. Severely dilated left ventricular internal cavity size.   4. Left ventricular diastolic parameters are consistent with Grade III diastolic dysfunction (restrictive).   5. Elevated left atrial pressure.   6. The left ventricle demonstrates  global hypokinesis.   7. Global right ventricle has normal systolic function.The right ventricular size is mildly enlarged.   8. Left atrial size was moderately dilated.   9. Right atrial size was normal.  10. The mitral valve is normal in structure. Mild mitral valve regurgitation.  11. The tricuspid valve is normal in structure. Tricuspid valve regurgitation is trivial.  12. The aortic valve is normal in structure. Aortic valve regurgitation is not visualized. No evidence of aortic valve sclerosis or stenosis.  13. The pulmonic valve was not well visualized. Pulmonic valve regurgitation is not visualized.   Interventional Neuro Radiology -  Cerebral Angiogram with Intervention 04/18/2019 S/P Lt common carotid arteriogram followed by complete revascularization of occluded LT MCA sup division mid M2 seg with x 1 pass with 52mx 40 mm solitaire X ret river device and penumbra aspiration with TICI 3 revascularization  ECG - SR rate 93 BPM. (See cardiology reading for complete details)  PHYSICAL EXAM    Temp:  [96.4 F (35.8 C)-99 F (37.2 C)] 99 F (37.2 C) (02/10 0800) Pulse Rate:  [36-115] 82 (02/10 0801) Resp:  [13-29] 24 (02/10 0801) BP: (74-138)/(40-103) 133/84 (02/10 0801) SpO2:  [96 %-100 %] 100 % (02/10 0801) Arterial Line BP: (111-160)/(59-98) 136/89 (02/10 0800) FiO2 (%):  [40 %] 40 % (02/10 0801) Weight:  [124.7 kg] 124.7 kg (02/10 0424)  General - Well nourished, well developed, intubated off sedation.  Ophthalmologic - fundi not visualized due to noncooperation.  Cardiovascular - Regular rhythm but tachycardia.  Neuro - intubated off sedation still on pressors, eyes spontaneously open, but not following commands. Eyes in mid position but slow rolling movement, more right gaze than left gaze today, but incomplete bilaterally, blinking to visual threat bilaterally, PERRL. Corneal reflex strongly present, gag and cough present. Breathing over the vent.  Facial symmetry not  able to test due to ET tube.  Tongue protrusion not cooperative. On pain stimulation, no movement of all limbs. DTR 1+ and bilateral positive babinski. Sensation, coordination and gait not tested.   ASSESSMENT/PLAN Mr. LDawit Tankard is a 34y.o. male with history of NICM EF 15-20%, CHF, DM, Obesity, tobacco use, OSA, and hx of cocaine use presenting with aphasia and right sided weakness. The patient received IV t-PA on Friday 05/06/2019 at 4:45 AM. Mechanical thrombectomy left M2.  Stroke:  L MCA infarct s/p tPA & IR with left M2 TICI3 reperfusion - embolic - likely secondary to severe cardiomyopathy with low EF and LV thrombus  Code Stroke CT Head - left MCA hyperdensity.   CTA H&N - emergent large vessel occlusion at the Left MCA bifurcation.   CT Perfusion - 60 mL of penumbra detected.   CT head repeat - Small region of loss of gray-white differentiation in the left frontal operculum   MRI head - will consider tomorrow once stable and between CRRT changing bags  2D Echo - EF < 20%   2D with contrast - possible LV apical thrombus 0.8 x 0.6  Sars Corona Virus 2 - negative  LDL - 203  HgbA1c - 6.5  VTE prophylaxis - heparin IV  aspirin 81 mg daily prior to admission, now on heparin IV per stroke protocol.  Therapy recommendations:  pending  Disposition:  Pending  Acute Hypoxemic Respiratory failure  Intubated  Off sedation now   CCM on board  CXR - consistent with multifocal infection, edema, and/or ARDS (unchanged)  Cardiogenic vs Septic Shock  Fever, TMax 103.2->97.5->99->99.1->afebrile  Leukocytosis, WBCs 21->15.6->20.4   CXR - consistent with multifocal infection, edema, and/or ARDS (unchanged)  On Meropenum and Vancomycin 04/23/19>>  On hydrocortisone  U/A neg, Urine culture no growth   Blood cultures neg   On levophed  LV thrombus Severe cardiomyopathy CHF  2D Echo - EF < 20%   2D with contrast - possible LV apical thrombus 0.8 x  0.6  On heparin IV  On amiodarone gtt for wide-complex tachycardia and hypotension  Cardiology on board  V-tach Elevated troponin  On amiodarone drip  Troponin trending down 16274->14226->2409   Cardiology on board  May consider cardioversion if needed  AKI  Creatinine - 1.10->2.64->5.17->4.09->3.91->3.64->3.18->3.10  renal consult 04/23/19  Still minimal urinary output, no signs of renal recovery  On CRRT  Hypotension w/ shock Hx Hypertension  Home BP meds: Coreg, Entresto  Now on levophed  Vasopressin weaned off  SBP goal  >110  Stable  Shock liver   GI on board  AST/ALT - >10000/10132 ->1920/2219->2846/4801->1059/3520   INR 3.1->1.8->1.2  CK 602  Improving  Hyperlipidemia  Home Lipid lowering medication: Lipitor 40 mg daily  LDL 203, goal < 70  Current lipid lowering medication: Lipitor 80 mg daily   Continue statin at discharge  Diabetes type II, uncontrolled  Home diabetic meds: insulin  Current diabetic meds: SS insulin, Levemir 20, novolog 4u q6h  HgbA1c 6.5, goal < 7.0  CBG monitoring   SSI  Still has hyperglycemia 200s  Dysphagia  Secondary to stroke  NPO  On tube feeding @ 50  Speech on board  Tobacco abuse  Current smoker  Mom confirmed tobacco use  Smoking cessation counseling will be provided  Other Stroke Risk Factors  ETOH use, advised to drink no more than 1 alcoholic beverage per day.  Hx cocaine abuse, UDS neg   Obesity, Body mass index is 36.27 kg/m., recommend weight loss, diet and exercise as appropriate   Obstructive sleep apnea - uses Cpap  Substance Abuse Hx of cocaine use  Other Active Problems  Code status - Full code  Hyperkalemia - 3.6->...->6.2->9.4->7.6   Metabolic acidosis, resolved  Depression on prozac  Hospital day # 5  This patient is critically ill and at significant risk of neurological worsening, death and care requires constant monitoring of vital  signs, hemodynamics,respiratory and cardiac monitoring, extensive review of multiple databases, frequent neurological assessment, discussion with family, other specialists and medical decision making of high complexity. I spent 48mnutes of neurocritical care time in the care of this patient. I also discussed with Dr. AElsworth Soho   JRosalin Hawking MD PhD Stroke Neurology 04/27/2019 9:30 AM   To contact Stroke Continuity provider, please refer to [email or web address] After hours, contact General Neurology

## 2019-04-26 NOTE — Progress Notes (Signed)
ANTICOAGULATION CONSULT NOTE  Pharmacy Consult for heparin Indication: stroke, LV thrombus   No Known Allergies  Patient Measurements: Height: 6\' 1"  (185.4 cm) Weight: 282 lb 3 oz (128 kg) IBW/kg (Calculated) : 79.9 Heparin Dosing Weight: 107 kg  Vital Signs: Temp: 97.7 F (36.5 C) (02/09 0200) Temp Source: Bladder (02/09 0000) BP: 106/64 (02/09 0200) Pulse Rate: 128 (02/09 0212)  Labs: Recent Labs    04/23/19 0945 04/23/19 1022 04/23/19 1122 04/23/19 1237 04/23/19 1356 04/23/19 2249 04/24/19 0843 04/24/19 0911 04/24/19 1611 04/24/19 1708 04/25/19 0358 04/25/19 0358 04/25/19 1600 04/25/19 2319 04/26/19 0155 04/26/19 0158  HGB 14.6   < >  --    < >  --    < > 13.7   < >   < >  --  12.5*   < >  --  11.6*  --  12.7*  HCT 44.0   < >  --    < >  --    < > 39.8   < >   < >  --  36.5*  --   --  34.0*  --  37.2*  PLT 145*  --   --    < >  --   --  137*  --   --   --  124*  --   --   --   --  157  LABPROT 31.7*  --   --   --   --   --  20.3*  --   --   --   --   --   --   --   --  14.7  INR 3.1*  --   --   --   --   --  1.8*  --   --   --   --   --   --   --   --  1.2  HEPARINUNFRC  --   --   --   --   --   --   --   --   --   --   --   --   --   --  <0.10*  --   CREATININE 4.21*  --   --   --   --    < > 4.77*   < >  --  4.32* 4.09*  --  3.91*  --   --   --   CKTOTAL  --   --   --   --  602*  --   --   --   --   --   --   --   --   --   --   --   TROPONINIHS [id number]*  --  14,226*  --   --   --   --   --   --   --   --   --   --   --   --   --    < > = values in this interval not displayed.    Estimated Creatinine Clearance: 37.3 mL/min (A) (by C-G formula based on SCr of 3.91 mg/dL (H)).  Assessment: 34 yo m initially presented with aphasia and right sided weaknesss. Found to have left MCA infarct - received TPA and revascularization with IR on 2/5. Now found to have LV apical thrombus on ECHO. To begin heparin with no bolus and low goal.  Heparin level undetectable on  1250 units/hr. No issues with line or bleeding  reported per RN.  Goal of Therapy:  Heparin level 0.3 - 0.5 units/ml Monitor platelets by anticoagulation protocol: Yes   Plan:  Increase heparin gtt to 1600 units/hr. Will f/u 6 hr heparin level  Sherlon Handing, PharmD, BCPS Please see amion for complete clinical pharmacist phone list 04/26/2019 2:41 AM

## 2019-04-26 NOTE — Progress Notes (Signed)
OT Cancellation Note  Patient Details Name: Carlos Brewer. MRN: [id number] DOB: 15-Jan-1986   Cancelled Treatment:    Reason Eval/Treat Not Completed: Patient not medically ready.  Pt remains intubated, on pressors, with elevated troponins, and on CRRT. Will sign off at this time.  Please re order when medically appropriate for therapies.   Nilsa Nutting., OTR/L Acute Rehabilitation Services Pager [phone] Office [phone]   Lucille Passy M 04/26/2019, 5:41 AM

## 2019-04-26 NOTE — Progress Notes (Signed)
Assisted tele visit to patient with family member.  Mauri Temkin R, RN  

## 2019-04-26 NOTE — Progress Notes (Addendum)
ANTICOAGULATION CONSULT NOTE  Pharmacy Consult for heparin Indication: stroke, LV thrombus   No Known Allergies  Patient Measurements: Height: 6\' 1"  (185.4 cm) Weight: 276 lb 0.3 oz (125.2 kg) IBW/kg (Calculated) : 79.9 Heparin Dosing Weight: 107 kg  Vital Signs: Temp: 98.1 F (36.7 C) (02/09 1300) BP: 109/90 (02/09 1700) Pulse Rate: 70 (02/09 1700)  Labs: Recent Labs    04/24/19 0843 04/24/19 0911 04/25/19 0358 04/25/19 0358 04/25/19 1600 04/25/19 2319 04/26/19 0155 04/26/19 0158 04/26/19 0927 04/26/19 1700  HGB 13.7   < > 12.5*   < >  --  11.6*  --  12.7*  --   --   HCT 39.8   < > 36.5*  --   --  34.0*  --  37.2*  --   --   PLT 137*  --  124*  --   --   --   --  157  --   --   LABPROT 20.3*  --   --   --   --   --   --  14.7  --   --   INR 1.8*  --   --   --   --   --   --  1.2  --   --   HEPARINUNFRC  --   --   --   --   --   --  <0.10*  --  <0.10* 0.20*  CREATININE 4.77*   < > 4.09*  --  3.91*  --   --  3.64*  --   --   TROPONINIHS  --   --   --   --   --   --   --  2,409*  --   --    < > = values in this interval not displayed.    Estimated Creatinine Clearance: 39.6 mL/min (A) (by C-G formula based on SCr of 3.64 mg/dL (H)).  Assessment: 34 yr old male initially presented with aphasia and right-sided weakness and was found to have left MCA infarct - received TPA and revascularization with IR on 2/5. Now, found to have LV apical thrombus on ECHO - to begin heparin with no bolus and low goal.  Heparin level after heparin infusion was decreased to  2000 units/hr was 0.44 units/ml, which is within the desired goal range for this patient. No issues with line or bleeding reported per RN.  Goal of Therapy:  Heparin level 0.3 - 0.5 units/ml Monitor platelets by anticoagulation protocol: Yes   Plan:  Continue heparin infusion at 2000 units/hr Check confirmatory 6-hr heparin level Monitor daily heparin level, CBC Monitor for signs/symptoms of bleeding  Alanda Slim, PharmD, [facility] Clinical Pharmacist Please see AMION for all Pharmacists' Contact Phone Numbers 04/27/2019, 10:48 AM

## 2019-04-26 NOTE — Progress Notes (Addendum)
The patient has been seen in conjunction with Rosaria Ferries, PA-C. All aspects of care have been considered and discussed. The patient has been personally interviewed, examined, and all clinical data has been reviewed.   Arrhythmia difficulty during the night and early this morning that includes predominantly SVT but also nonsustained VT.  Agree with institution of IV amiodarone which should manage both.  Hopefully, with rhythm stability pressor requirement can be reduced.  Escalation of pressor therapy is a poor prognostic indicator in this situation.  No further cardiac recommendations  DAILY PROGRESS NOTE   Patient Name: Carlos Brewer. Date of Encounter: 04/26/2019 Cardiologist: Ida Rogue, MD  Patient Profile   34 yo male with history of NICM and myocarditis, followed by the advanced CHF service, presented 02/05 with acute MCA stroke treated with tPA and IR thrombectomy, now hypotensive, concerning for possible septic versus cardiogenic shock  Subjective   Opens eyes to touch. No response to commands, no directed movements  Objective   Vitals:   04/26/19 0645 04/26/19 0700 04/26/19 0715 04/26/19 0800  BP: 103/70 108/73 115/74 105/79  Pulse: (!) 118 (!) 119 (!) 118 (!) 116  Resp: (!) 30 (!) 30 (!) 29 (!) 29  Temp: 98.8 F (37.1 C) 98.6 F (37 C) 98.6 F (37 C) 98.4 F (36.9 C)  TempSrc:      SpO2: 100% 100% 100% 100%  Weight:      Height:        Intake/Output Summary (Last 24 hours) at 04/26/2019 0912 Last data filed at 04/26/2019 0800 Gross per 24 hour  Intake 2217.69 ml  Output 3936 ml  Net -1718.31 ml   Filed Weights   04/24/19 0500 04/25/19 0500 04/26/19 0500  Weight: 133 kg 128 kg 125.2 kg    Physical Exam   General: Well developed, well nourished, male in no acute distress Head: Eyes PERRLA, Head normocephalic and atraumatic Lungs: clear bilaterally to auscultation. Heart: HRRR S1 S2, without rub or gallop. No murmur. 4/4 extremity  pulses are 2+ & equal. No JVD. Abdomen: Bowel sounds are present, abdomen soft and non-tender without masses or  hernias noted. Extremities: No clubbing, cyanosis or edema.    Skin:  No rashes or lesions noted. Neuro: wakens to touch, no verbal response, no directed movements   Inpatient Medications    Scheduled Meds: . sodium chloride   Intravenous Once  . chlorhexidine gluconate (MEDLINE KIT)  15 mL Mouth Rinse BID  . Chlorhexidine Gluconate Cloth  6 each Topical Q0600  . Chlorhexidine Gluconate Cloth  6 each Topical Q0600  . feeding supplement (PRO-STAT SUGAR FREE 64)  60 mL Per Tube TID  . fentaNYL (SUBLIMAZE) injection  50 mcg Intravenous Once  . hydrocortisone sod succinate (SOLU-CORTEF) inj  50 mg Intravenous Q12H  . insulin aspart  0-20 Units Subcutaneous Q4H  . mouth rinse  15 mL Mouth Rinse 10 times per day  . mupirocin ointment  1 application Nasal BID  . pantoprazole sodium  40 mg Per Tube Q1200  . sodium chloride flush  10-40 mL Intracatheter Q12H    Continuous Infusions: .  prismasol BGK 4/2.5 500 mL/hr at 04/26/19 0517  .  prismasol BGK 4/2.5 200 mL/hr at 04/25/19 2349  . sodium chloride    . sodium chloride 10 mL/hr at 04/26/19 0800  . amiodarone 30 mg/hr (04/26/19 0905)  . feeding supplement (VITAL HIGH PROTEIN) 50 mL/hr at 04/26/19 0700  . heparin 1,600 Units/hr (04/26/19 0800)  . meropenem (  MERREM) IV Stopped (04/26/19 0630)  . norepinephrine (LEVOPHED) Adult infusion 30 mcg/min (04/26/19 0800)  . prismasol BGK 4/2.5 2,000 mL/hr at 04/26/19 0803  . vancomycin Stopped (04/25/19 1147)  . vasopressin (PITRESSIN) infusion - *FOR SHOCK*      PRN Meds: sodium chloride, bisacodyl, docusate, fentaNYL, fentaNYL (SUBLIMAZE) injection, heparin, heparin, labetalol **AND** [DISCONTINUED] niCARDipine, midazolam, sodium chloride flush   Labs   Results for orders placed or performed during the hospital encounter of 05/12/2019 (from the past 48 hour(s))  Lactic acid,  plasma     Status: Abnormal   Collection Time: 04/24/19 11:59 AM  Result Value Ref Range   Lactic Acid, Venous 3.5 (HH) 0.5 - 1.9 mmol/L    Comment: CRITICAL VALUE NOTED.  VALUE IS CONSISTENT WITH PREVIOUSLY REPORTED AND CALLED VALUE. Performed at [facility], [place] [address] [place], [place] [address]   Glucose, capillary     Status: Abnormal   Collection Time: 04/24/19 12:55 PM  Result Value Ref Range   Glucose-Capillary 177 (H) 70 - 99 mg/dL  Potassium     Status: Abnormal   Collection Time: 04/24/19  1:07 PM  Result Value Ref Range   Potassium 5.5 (H) 3.5 - 5.1 mmol/L    Comment: Performed at [facility], [place] [address], [place], [place] [address]  Glucose, capillary     Status: Abnormal   Collection Time: 04/24/19  4:07 PM  Result Value Ref Range   Glucose-Capillary 158 (H) 70 - 99 mg/dL   Comment 1 Notify RN    Comment 2 Document in Chart   I-STAT 7, (LYTES, BLD GAS, ICA, H+H)     Status: Abnormal   Collection Time: 04/24/19  4:11 PM  Result Value Ref Range   pH, Arterial 7.353 7.350 - 7.450   pCO2 arterial 52.4 (H) 32.0 - 48.0 mmHg   pO2, Arterial 84.0 83.0 - 108.0 mmHg   Bicarbonate 29.1 (H) 20.0 - 28.0 mmol/L   TCO2 31 22 - 32 mmol/L   O2 Saturation 95.0 %   Acid-Base Excess 3.0 (H) 0.0 - 2.0 mmol/L   Sodium 137 135 - 145 mmol/L   Potassium 4.0 3.5 - 5.1 mmol/L   Calcium, Ion 0.95 (L) 1.15 - 1.40 mmol/L   HCT 37.0 (L) 39.0 - 52.0 %   Hemoglobin 12.6 (L) 13.0 - 17.0 g/dL   Patient temperature 37.0 C    Sample type ARTERIAL   Renal function panel (daily at 1600)     Status: Abnormal   Collection Time: 04/24/19  5:08 PM  Result Value Ref Range   Sodium 139 135 - 145 mmol/L   Potassium 4.3 3.5 - 5.1 mmol/L   Chloride 98 98 - 111 mmol/L   CO2 26 22 - 32 mmol/L   Glucose, Bld 159 (H) 70 - 99 mg/dL   BUN 28 (H) 6 - 20 mg/dL   Creatinine, Ser 4.32 (H) 0.61 - 1.24 mg/dL   Calcium 7.3 (L) 8.9 - 10.3 mg/dL   Phosphorus 4.5 2.5 - 4.6 mg/dL   Albumin 2.8  (L) 3.5 - 5.0 g/dL   GFR calc non Af Amer 17 (L) >60 mL/min   GFR calc Af Amer 19 (L) >60 mL/min   Anion gap 15 5 - 15    Comment: Performed at [facility], [place] [address] [place], [place] [address]  Magnesium     Status: None   Collection Time: 04/24/19  5:08 PM  Result Value Ref Range  Magnesium 1.9 1.7 - 2.4 mg/dL    Comment: Performed at [facility], [place] [address] [place], [place] [address]  Glucose, capillary     Status: Abnormal   Collection Time: 04/24/19  8:21 PM  Result Value Ref Range   Glucose-Capillary 152 (H) 70 - 99 mg/dL  Potassium     Status: None   Collection Time: 04/24/19  8:23 PM  Result Value Ref Range   Potassium 4.6 3.5 - 5.1 mmol/L    Comment: Performed at [facility], [place] [address] [place], [place] [address]  Potassium     Status: None   Collection Time: 04/24/19 11:15 PM  Result Value Ref Range   Potassium 4.4 3.5 - 5.1 mmol/L    Comment: Performed at [facility] [address] [place], [place] [address]  Glucose, capillary     Status: Abnormal   Collection Time: 04/24/19 11:21 PM  Result Value Ref Range   Glucose-Capillary 180 (H) 70 - 99 mg/dL  .Cooxemetry Panel (carboxy, met, total hgb, O2 sat)     Status: Abnormal   Collection Time: 04/25/19  3:32 AM  Result Value Ref Range   Total hemoglobin 12.7 12.0 - 16.0 g/dL   O2 Saturation 85.0 %   Carboxyhemoglobin 1.6 (H) 0.5 - 1.5 %   Methemoglobin 1.0 0.0 - 1.5 %    Comment: Performed at [facility] [address] [place], [place] [address]  Glucose, capillary     Status: Abnormal   Collection Time: 04/25/19  3:47 AM  Result Value Ref Range   Glucose-Capillary 162 (H) 70 - 99 mg/dL  Procalcitonin     Status: None   Collection Time: 04/25/19  3:58 AM  Result Value Ref Range   Procalcitonin 28.31 ng/mL    Comment:        Interpretation: PCT >= 10 ng/mL: Important systemic inflammatory response, almost exclusively due to severe bacterial sepsis or  septic shock. (NOTE)       Sepsis PCT Algorithm           Lower Respiratory Tract                                      Infection PCT Algorithm    ----------------------------     ----------------------------         PCT < 0.25 ng/mL                PCT < 0.10 ng/mL         Strongly encourage             Strongly discourage   discontinuation of antibiotics    initiation of antibiotics    ----------------------------     -----------------------------       PCT 0.25 - 0.50 ng/mL            PCT 0.10 - 0.25 ng/mL               OR       >80% decrease in PCT            Discourage initiation of                                            antibiotics  Encourage discontinuation           of antibiotics    ----------------------------     -----------------------------         PCT >= 0.50 ng/mL              PCT 0.26 - 0.50 ng/mL                AND       <80% decrease in PCT             Encourage initiation of                                             antibiotics       Encourage continuation           of antibiotics    ----------------------------     -----------------------------        PCT >= 0.50 ng/mL                  PCT > 0.50 ng/mL               AND         increase in PCT                  Strongly encourage                                      initiation of antibiotics    Strongly encourage escalation           of antibiotics                                     -----------------------------                                           PCT <= 0.25 ng/mL                                                 OR                                        > 80% decrease in PCT                                     Discontinue / Do not initiate                                             antibiotics Performed at [facility], [place] [address], [place], [place] [address]   Renal function panel (daily at 0500)     Status: Abnormal   Collection Time: 04/25/19  3:58 AM  Result Value Ref Range    Sodium 138 135 - 145 mmol/L   Potassium 4.2 3.5 - 5.1 mmol/L   Chloride 93 (L) 98 - 111 mmol/L   CO2 26 22 - 32 mmol/L   Glucose, Bld 171 (H) 70 - 99 mg/dL   BUN 31 (H) 6 - 20 mg/dL   Creatinine, Ser 4.09 (H) 0.61 - 1.24 mg/dL   Calcium 7.7 (L) 8.9 - 10.3 mg/dL   Phosphorus 4.0 2.5 - 4.6 mg/dL   Albumin 2.9 (L) 3.5 - 5.0 g/dL   GFR calc non Af Amer 18 (L) >60 mL/min   GFR calc Af Amer 21 (L) >60 mL/min   Anion gap 19 (H) 5 - 15    Comment: Performed at [facility], [address], [place], [place] [address]  Magnesium     Status: None   Collection Time: 04/25/19  3:58 AM  Result Value Ref Range   Magnesium 2.1 1.7 - 2.4 mg/dL    Comment: Performed at [facility] [address], [place], [place] [address]  CBC     Status: Abnormal   Collection Time: 04/25/19  3:58 AM  Result Value Ref Range   WBC 15.6 (H) 4.0 - 10.5 K/uL   RBC 4.08 (L) 4.22 - 5.81 MIL/uL   Hemoglobin 12.5 (L) 13.0 - 17.0 g/dL   HCT 36.5 (L) 39.0 - 52.0 %   MCV 89.5 80.0 - 100.0 fL   MCH 30.6 26.0 - 34.0 pg   MCHC 34.2 30.0 - 36.0 g/dL   RDW 12.1 11.5 - 15.5 %   Platelets 124 (L) 150 - 400 K/uL    Comment: REPEATED TO VERIFY   nRBC 0.3 (H) 0.0 - 0.2 %    Comment: Performed at [facility], [place] [address], [place], [place] [address]  Glucose, capillary     Status: Abnormal   Collection Time: 04/25/19  8:11 AM  Result Value Ref Range   Glucose-Capillary 164 (H) 70 - 99 mg/dL   Comment 1 Notify RN    Comment 2 Document in Chart   Potassium     Status: None   Collection Time: 04/25/19 10:08 AM  Result Value Ref Range   Potassium 3.9 3.5 - 5.1 mmol/L    Comment: Performed at [facility], [place] [address], [place], [place] [address]  Hepatic function panel     Status: Abnormal   Collection Time: 04/25/19 10:08 AM  Result Value Ref Range   Total Protein 5.9 (L) 6.5 - 8.1 g/dL   Albumin 2.9 (L) 3.5 - 5.0 g/dL   AST 1,920 (H) 15 - 41 U/L    Comment: RESULTS CONFIRMED BY MANUAL  DILUTION   ALT 2,219 (H) 0 - 44 U/L    Comment: RESULTS CONFIRMED BY MANUAL DILUTION   Alkaline Phosphatase 94 38 - 126 U/L   Total Bilirubin 3.9 (H) 0.3 - 1.2 mg/dL   Bilirubin, Direct 1.5 (H) 0.0 - 0.2 mg/dL   Indirect Bilirubin 2.4 (H) 0.3 - 0.9 mg/dL    Comment: Performed at [facility] [address] [place] [address], [place], [place] [address]  Glucose, capillary     Status: Abnormal   Collection Time: 04/25/19 12:19 PM  Result Value Ref Range   Glucose-Capillary 168 (H) 70 - 99 mg/dL   Comment 1 Notify RN    Comment 2 Document in Chart   Glucose, capillary     Status: Abnormal   Collection Time:  04/25/19  3:53 PM  Result Value Ref Range   Glucose-Capillary 160 (H) 70 - 99 mg/dL   Comment 1 Notify RN    Comment 2 Document in Chart   Renal function panel (daily at 1600)     Status: Abnormal   Collection Time: 04/25/19  4:00 PM  Result Value Ref Range   Sodium 138 135 - 145 mmol/L   Potassium 4.3 3.5 - 5.1 mmol/L   Chloride 93 (L) 98 - 111 mmol/L   CO2 28 22 - 32 mmol/L   Glucose, Bld 171 (H) 70 - 99 mg/dL   BUN 35 (H) 6 - 20 mg/dL   Creatinine, Ser 3.91 (H) 0.61 - 1.24 mg/dL   Calcium 7.6 (L) 8.9 - 10.3 mg/dL   Phosphorus 4.9 (H) 2.5 - 4.6 mg/dL   Albumin 2.8 (L) 3.5 - 5.0 g/dL   GFR calc non Af Amer 19 (L) >60 mL/min   GFR calc Af Amer 22 (L) >60 mL/min   Anion gap 17 (H) 5 - 15    Comment: Performed at [facility], [address], [place], [place] [address]  Glucose, capillary     Status: Abnormal   Collection Time: 04/25/19  8:09 PM  Result Value Ref Range   Glucose-Capillary 173 (H) 70 - 99 mg/dL  I-STAT 7, (LYTES, BLD GAS, ICA, H+H)     Status: Abnormal   Collection Time: 04/25/19 11:19 PM  Result Value Ref Range   pH, Arterial 7.393 7.350 - 7.450   pCO2 arterial 49.1 (H) 32.0 - 48.0 mmHg   pO2, Arterial 167.0 (H) 83.0 - 108.0 mmHg   Bicarbonate 30.2 (H) 20.0 - 28.0 mmol/L   TCO2 32 22 - 32 mmol/L   O2 Saturation 99.0 %   Acid-Base Excess 4.0 (H) 0.0 - 2.0  mmol/L   Sodium 135 135 - 145 mmol/L   Potassium 3.8 3.5 - 5.1 mmol/L   Calcium, Ion 1.00 (L) 1.15 - 1.40 mmol/L   HCT 34.0 (L) 39.0 - 52.0 %   Hemoglobin 11.6 (L) 13.0 - 17.0 g/dL   Patient temperature 96.8 F    Collection site RADIAL, ALLEN'S TEST ACCEPTABLE    Drawn by RT    Sample type ARTERIAL   Glucose, capillary     Status: Abnormal   Collection Time: 04/25/19 11:41 PM  Result Value Ref Range   Glucose-Capillary 206 (H) 70 - 99 mg/dL  Heparin level (unfractionated)     Status: Abnormal   Collection Time: 04/26/19  1:55 AM  Result Value Ref Range   Heparin Unfractionated <0.10 (L) 0.30 - 0.70 IU/mL    Comment: (NOTE) If heparin results are below expected values, and patient dosage has  been confirmed, suggest follow up testing of antithrombin III levels. Performed at [facility], [place] [address], [place], [place] [address]   Magnesium     Status: Abnormal   Collection Time: 04/26/19  1:58 AM  Result Value Ref Range   Magnesium 2.6 (H) 1.7 - 2.4 mg/dL    Comment: Performed at [facility] [address] [place], [place] [address]  CBC     Status: Abnormal   Collection Time: 04/26/19  1:58 AM  Result Value Ref Range   WBC 20.4 (H) 4.0 - 10.5 K/uL   RBC 4.11 (L) 4.22 - 5.81 MIL/uL   Hemoglobin 12.7 (L) 13.0 - 17.0 g/dL   HCT 37.2 (L) 39.0 - 52.0 %   MCV 90.5 80.0 -  100.0 fL   MCH 30.9 26.0 - 34.0 pg   MCHC 34.1 30.0 - 36.0 g/dL   RDW 12.3 11.5 - 15.5 %   Platelets 157 150 - 400 K/uL   nRBC 0.6 (H) 0.0 - 0.2 %    Comment: Performed at [facility] [address] [place], [place] [address]  Comprehensive metabolic panel     Status: Abnormal   Collection Time: 04/26/19  1:58 AM  Result Value Ref Range   Sodium 135 135 - 145 mmol/L   Potassium 3.9 3.5 - 5.1 mmol/L   Chloride 96 (L) 98 - 111 mmol/L   CO2 25 22 - 32 mmol/L   Glucose, Bld 212 (H) 70 - 99 mg/dL   BUN 42 (H) 6 - 20 mg/dL   Creatinine, Ser 3.64 (H) 0.61 - 1.24 mg/dL   Calcium 7.7  (L) 8.9 - 10.3 mg/dL   Total Protein 6.0 (L) 6.5 - 8.1 g/dL   Albumin 2.8 (L) 3.5 - 5.0 g/dL   AST 2,846 (H) 15 - 41 U/L    Comment: RESULTS CONFIRMED BY MANUAL DILUTION   ALT 4,801 (H) 0 - 44 U/L    Comment: RESULTS CONFIRMED BY MANUAL DILUTION   Alkaline Phosphatase 104 38 - 126 U/L   Total Bilirubin 3.1 (H) 0.3 - 1.2 mg/dL   GFR calc non Af Amer 21 (L) >60 mL/min   GFR calc Af Amer 24 (L) >60 mL/min   Anion gap 14 5 - 15    Comment: Performed at [facility], [place] [address], [place], [place] [address]  Protime-INR     Status: None   Collection Time: 04/26/19  1:58 AM  Result Value Ref Range   Prothrombin Time 14.7 11.4 - 15.2 seconds   INR 1.2 0.8 - 1.2    Comment: (NOTE) INR goal varies based on device and disease states. Performed at [facility], [place] [address] [place], [place] [address]   Troponin I (High Sensitivity)     Status: Abnormal   Collection Time: 04/26/19  1:58 AM  Result Value Ref Range   Troponin I (High Sensitivity) 2,409 (HH) <18 ng/L    Comment: CRITICAL RESULT CALLED TO, READ BACK BY AND VERIFIED WITH: RN J Mellany Dinsmore @0306  04/26/19 BY S GEZAHEGN (NOTE) Elevated high sensitivity troponin I (hsTnI) values and significant  changes across serial measurements may suggest ACS but many other  chronic and acute conditions are known to elevate hsTnI results.  Refer to the Links section for chest pain algorithms and additional  guidance. Performed at [facility], [place] [address] [place], [place] [address]   Phosphorus     Status: None   Collection Time: 04/26/19  1:58 AM  Result Value Ref Range   Phosphorus 4.1 2.5 - 4.6 mg/dL    Comment: Performed at [facility] [address], [place], [place] [address]  Glucose, capillary     Status: Abnormal   Collection Time: 04/26/19  3:40 AM  Result Value Ref Range   Glucose-Capillary 226 (H) 70 - 99 mg/dL  .Cooxemetry Panel (carboxy, met, total hgb, O2 sat)     Status: None   Collection Time:  04/26/19  5:46 AM  Result Value Ref Range   Total hemoglobin 13.4 12.0 - 16.0 g/dL   O2 Saturation 98.4 %   Carboxyhemoglobin 1.1 0.5 - 1.5 %   Methemoglobin 0.7 0.0 - 1.5 %    Comment: Performed at [facility]  [facility], [place] [address], [place], [place] [address]  Glucose, capillary     Status: Abnormal   Collection Time: 04/26/19  8:11 AM  Result Value Ref Range   Glucose-Capillary 220 (H) 70 - 99 mg/dL   Comment 1 Notify RN    Comment 2 Document in Chart    Lab Results  Component Value Date   TSH 2.237 05/15/2019     ECG   ECG:  02/09 AT 1:48 am, ?atrial vs junctional tach, HR 125, QRS 158 ms 02/09 at 3:26 am, same morphology, HR 119 On previous ECG, QRS duration 114 ms  Telemetry   Last pm had 9 bt run NSVT, followed by SVT, ?Atrial tach - Personally Reviewed  Radiology    DG Chest Port 1 View  Result Date: 04/26/2019 CLINICAL DATA:  Acute respiratory failure, ET tube, post stroke. EXAM: PORTABLE CHEST 1 VIEW COMPARISON:  04/25/2019 9:32 a.m. FINDINGS: Endotracheal tube projects approximately 4.5 cm above the carina similar position between the clavicular heads as compared to most recent study. Left-sided central venous catheter terminates in proximal SVC. Right IJ catheter dual lumen terminating in the right brachiocephalic vein. Tip just at the brachiocephalic junction. Cardiomediastinal contours remain enlarged and are largely obscured particularly in the left chest. Interstitial prominence/congestive changes. Similar to prior study. Visualized skeletal structures are unremarkable. IMPRESSION: 1. Lines and tubes as described above. 2. Cardiomegaly with basilar airspace disease, likely associated with effusions. 3. Interstitial and alveolar opacities elsewhere may represent a combination of infection and edema and are little changed. Electronically Signed   By: Zetta Bills M.D.   On: 04/26/2019 08:54   DG CHEST PORT 1 VIEW  Result Date: 04/25/2019 CLINICAL DATA:   Respiratory failure, COVID-19 positivity EXAM: PORTABLE CHEST 1 VIEW COMPARISON:  04/23/2019 FINDINGS: Endotracheal tube, gastric catheter and bilateral jugular central lines are again noted and stable. Cardiac shadow remains enlarged but accentuated by the portable technique. Previously seen airspace opacities have improved significantly with some mild persistent basilar changes. Small effusions are seen. No bony abnormality is noted. IMPRESSION: Overall improved aeration from the prior exam although persistent opacities and small effusions are noted. Electronically Signed   By: Inez Catalina M.D.   On: 04/25/2019 09:46   ECHOCARDIOGRAM LIMITED  Result Date: 04/25/2019    ECHOCARDIOGRAM LIMITED REPORT   Patient Name:   Carlos Brewer. Date of Exam: 04/25/2019 Medical Rec #:  [id number]                 Height:       73.0 in Accession #:    [id number]                Weight:       282.2 lb Date of Birth:  1985-12-05                 BSA:          2.49 m Patient Age:    33 years                  BP:           106/76 mmHg Patient Gender: M                         HR:           70 bpm. Exam Location:  Inpatient Procedure: Limited Echo and Intracardiac Opacification Agent Indications:    Stoke I163.9;  Apex not seen well on initial echo, evauluation                 for LV thrombus.  History:        Patient has prior history of Echocardiogram examinations, most                 recent 04/25/2019. Non-ischemic Cardiomyopathy and CHF; Risk                 Factors:Cocaine Abuse and Current Smoker.  Sonographer:    Dustin Flock Referring Phys: [place]  1. Left ventricular ejection fraction, by estimation, is <20%. The left ventricle has severely decreased function. The left ventricle demonstrates global hypokinesis. The left ventricular internal cavity size was severely dilated. Possible small 0.8 x 0.6 cm apical thrombus. Somewhat subtle finding, could confirm with cardiac MRI. However, if  patient has had CVA, would be reasonable to anticoagulate given severity of LV dysfunction if no contraindications to anticoagulation.  2. Limited echo. FINDINGS  Left Ventricle: Left ventricular ejection fraction, by estimation, is <20%. The left ventricle has severely decreased function. The left ventricle demonstrates global hypokinesis. The left ventricular internal cavity size was severely dilated. There is no left ventricular hypertrophy. Loralie Champagne MD Electronically signed by Loralie Champagne MD Signature Date/Time: 04/25/2019/5:35:07 PM    Final     Cardiac Studies   ECHO Limited: 04/25/2019 1. Left ventricular ejection fraction, by estimation, is <20%. The left  ventricle has severely decreased function. The left ventricle demonstrates  global hypokinesis. The left ventricular internal cavity size was severely  dilated. Possible small 0.8 x 0.6 cm apical thrombus.  Somewhat subtle finding, could confirm with cardiac  MRI. However, if patient has had CVA, would be reasonable to  anticoagulate given severity of LV dysfunction if no contraindications to  anticoagulation.  2. Limited echo.   ECHO: 04/24/2019 1. Left ventricular ejection fraction, by visual estimation, is <20%. The  left ventricle has severely decreased function. There is no left  ventricular hypertrophy.  2. Apex is not well-visualized. In setting of stroke with severe LV  systolic dysfunction, recommend repeat TTE with contrast to rule out LV  apical thrombus  3. Severely dilated left ventricular internal cavity size.  4. Left ventricular diastolic parameters are consistent with Grade III  diastolic dysfunction (restrictive).  5. Elevated left atrial pressure.  6. The left ventricle demonstrates global hypokinesis.  7. Global right ventricle has normal systolic function.The right  ventricular size is mildly enlarged.  8. Left atrial size was moderately dilated.  9. Right atrial size was normal.  10. The  mitral valve is normal in structure. Mild mitral valve  regurgitation.  11. The tricuspid valve is normal in structure. Tricuspid valve  regurgitation is trivial.  12. The aortic valve is normal in structure. Aortic valve regurgitation is  not visualized. No evidence of aortic valve sclerosis or stenosis.  13. The pulmonic valve was not well visualized. Pulmonic valve  regurgitation is not visualized.    Assessment and Plan   1. Shock - after brief NSVT, pt in WCT, amio started, BP dropped - Levo increased and vasopressin ordered - believed to be septic shock - Coox today 98  2. ARF - BUN/Cr 17/1.17 on admit - peak BUN/Cr 29/5.17 - anuric - Nephrology managing but after NSVT, they are keeping him fluid even, will start ultrafiltration once more hemodynamically stable - CVP 22 today  3. Shock liver - LFTs initially  ok, then AST & ALT > 10,000 - CK 602 - Improving.  4. CVA - IR did TPA and thrombectomy of R-MCA - apical thrombus seen on limited echo w/ Definity - s/p TPA and thrombectomy by IR - on heparin  5. Elevated troponin, hx CHF - EF < 20% is unchanged - trop >16,000 - hx NICM - no further workup at this time  6. Tachycardia - brief NSVT, then WCT, believed to be either atrial tach or junctional - on amio - because of pressors, no BB/CCB - no need for DCCV - Keep K+ > 4.0, Mg > 2.0 - K+ 3.9 today, Mg 2.6  Otherwise, per Neuro, CCM Active Problems:   Occlusion of left middle cerebral artery   Middle cerebral artery embolism, left   Cerebrovascular accident (CVA) due to thrombosis of left middle cerebral artery ([place])   Shock ([place])   Shock liver   Respiratory failure, acute ([place])  > 30" w/ pt/chart, > 50% w/ pt  Length of Stay:  LOS: 4 days   Rhonda Barrett 04/26/2019, 9:12 AM

## 2019-04-26 NOTE — Progress Notes (Signed)
Inpatient Diabetes Program Recommendations  AACE/ADA: New Consensus Statement on Inpatient Glycemic Control (2015)  Target Ranges:  Prepandial:   less than 140 mg/dL      Peak postprandial:   less than 180 mg/dL (1-2 hours)      Critically ill patients:  140 - 180 mg/dL   Lab Results  Component Value Date   GLUCAP 215 (H) 04/26/2019   HGBA1C 6.5 (H) 04/23/2019    Review of Glycemic Control Results for BERTHEL, KAPAUN (MRN [id number]) as of 04/26/2019 14:27  Ref. Range 04/26/2019 03:40 04/26/2019 08:11 04/26/2019 11:24  Glucose-Capillary Latest Ref Range: 70 - 99 mg/dL 226 (H) 220 (H) 215 (H)   Diabetes history: Type 2 DM Outpatient Diabetes medications: Levemir 40 units QHS, Novolog 10 units TID Current orders for Inpatient glycemic control: Levemir 10 units QD, Novolog 0-20 units Q4H Solucortef 50 mg BID Vital @50ml /hr  Inpatient Diabetes Program Recommendations:    Consider adding Novolog 4 units Q4H for tube feed coverage (to be stopped or held if tube feeds are held).  Thanks, Bronson Curb, MSN, RNC-OB Diabetes Coordinator [phone] (8a-5p)

## 2019-04-26 NOTE — Progress Notes (Signed)
STROKE TEAM PROGRESS NOTE   INTERVAL HISTORY RN at bedside. Pt is still intubated, and on CRRT at bedside. Overnight had tachycardia with V-tach. Cardiology on board, on amiodarone IV drip. Initially planned for cardioversion but eventually not needed. This morning still has tachycardia at 110s. Still open eyes on stimulation but not following commands.     OBJECTIVE Vitals:   04/26/19 0715 04/26/19 0800 04/26/19 0900 04/26/19 1000  BP: 115/74 105/79 110/81   Pulse: (!) 118 (!) 116 (!) 115 (!) 115  Resp: (!) 29 (!) 29 (!) 29 (!) 29  Temp: 98.6 F (37 C) 98.4 F (36.9 C) 98.2 F (36.8 C) 98.1 F (36.7 C)  TempSrc:      SpO2: 100% 100% 99% 99%  Weight:      Height:        CBC:  Recent Labs  Lab 04/23/19 0945 04/23/19 1022 04/24/19 0843 04/24/19 0911 04/25/19 0358 04/25/19 0358 04/25/19 2319 04/26/19 0158  WBC 16.2*   < > 17.6*   < > 15.6*  --   --  20.4*  NEUTROABS 13.5*  --  15.8*  --   --   --   --   --   HGB 14.6   < > 13.7   < > 12.5*   < > 11.6* 12.7*  HCT 44.0   < > 39.8   < > 36.5*   < > 34.0* 37.2*  MCV 92.6   < > 92.3   < > 89.5  --   --  90.5  PLT 145*   < > 137*   < > 124*  --   --  157   < > = values in this interval not displayed.    Basic Metabolic Panel:  Recent Labs  Lab 04/25/19 0358 04/25/19 1008 04/25/19 1600 04/25/19 1600 04/25/19 2319 04/26/19 0158  NA 138  --  138   < > 135 135  K 4.2   < > 4.3   < > 3.8 3.9  CL 93*  --  93*  --   --  96*  CO2 26  --  28  --   --  25  GLUCOSE 171*  --  171*  --   --  212*  BUN 31*  --  35*  --   --  42*  CREATININE 4.09*  --  3.91*  --   --  3.64*  CALCIUM 7.7*  --  7.6*  --   --  7.7*  MG 2.1  --   --   --   --  2.6*  PHOS 4.0  --  4.9*  --   --  4.1   < > = values in this interval not displayed.   Lipid Panel:     Component Value Date/Time   CHOL 265 (H) 04/23/2019 0327   TRIG 191 (H) 04/23/2019 0327   HDL 24 (L) 04/23/2019 0327   CHOLHDL 11.0 04/23/2019 0327   VLDL 38 04/23/2019 0327    LDLCALC 203 (H) 04/23/2019 0327   HgbA1c:  Lab Results  Component Value Date   HGBA1C 6.5 (H) 04/23/2019   Urine Drug Screen:     Component Value Date/Time   LABOPIA NONE DETECTED 04/23/2019 0930   COCAINSCRNUR NONE DETECTED 04/23/2019 0930   COCAINSCRNUR NONE DETECTED 02/04/2018 1010   LABBENZ POSITIVE (A) 04/23/2019 0930   AMPHETMU NONE DETECTED 04/23/2019 0930   THCU NONE DETECTED 04/23/2019 0930   LABBARB NONE DETECTED 04/23/2019 0930  Alcohol Level     Component Value Date/Time   ETH <10 05/11/2019 0355    IMAGING  CT HEAD CODE STROKE WO CONTRAST 05/03/2019 IMPRESSION:  1. Asymmetric hyperdensity of a Left MCA branch in the Sylvian fissure suspicious for emergent large vessel occlusion in the setting.  2. No associated acute cortically based infarct, ASPECTS 10. No acute hemorrhage.  3. Chronic right frontal operculum encephalomalacia.   CT Code Stroke CTA Head W/WO contrast CT Code Stroke CTA Neck W/WO contrast 05/09/2019 IMPRESSION:  1. Positive for emergent large vessel occlusion at the Left MCA bifurcation. There is reconstitution of the dominant posterior division branch.  2. Elsewhere negative CTA head and neck. Dominant right vertebral artery which supplies the basilar. Fetal type PCA origins.   CT CEREBRAL PERFUSION W CONTRAST 05/05/2019 IMPRESSION:  1. CTP detects evidence of Left MCA territory oligemia concordant with the CTA findings.  2. No infarct core detected with standard CBF <30%.  3. 60 mL of penumbra detected with standard T-max > 6s.   CT HEAD WO CONTRAST 04/23/2019 IMPRESSION:  Small region of loss of gray-white differentiation in the left frontal operculum consistent with a fairly small area of acute infarction compared to the previous perfusion abnormality. No hemorrhage or mass effect. Areas of low-density in the cerebellum that I favor are artifactual. There did not seem to be any abnormalities in that region on the previous examinations.    DG CHEST PORT 1 VIEW 05/12/2019 IMPRESSION:  1. Interval placement of neck vascular catheter, tip positioned near the superior cavoatrial junction.  2. Interval retraction of endotracheal tube, now positioned just above the thoracic inlet, approximately 6.5 cm above the carina.  3. Interval placement of esophagogastric tube, tip and side port below the diaphragm.  4. Stable cardiomegaly and diffuse bilateral interstitial airspace opacity, likely edema.   DG Chest Port 1 View 04/19/2019 IMPRESSION:  1. Endotracheal tube tip noted just above the carina. Proximal repositioning of 2-3 should be considered.  2. Cardiomegaly again noted. Diffuse bilateral pulmonary infiltrates/edema. Small left pleural effusion.   DG Chest Port 1 View  04/23/19 IMPRESSION: 1. Interval placement of a right neck vascular sheath, tip positioned over the right brachiocephalic vein. 2.  Other support apparatus in unchanged position. 3. Slight interval increase in diffuse bilateral interstitial and heterogeneous airspace opacity, consistent with multifocal infection, edema, and/or ARDS. 4.  Cardiomegaly.  ECHOCARDIOGRAM COMPLETE 05/02/2019 IMPRESSIONS   1. Left ventricular ejection fraction, by visual estimation, is <20%. The left ventricle has severely decreased function. There is no left ventricular hypertrophy.   2. Apex is not well-visualized. In setting of stroke with severe LV systolic dysfunction, recommend repeat TTE with contrast to rule out LV apical thrombus   3. Severely dilated left ventricular internal cavity size.   4. Left ventricular diastolic parameters are consistent with Grade III diastolic dysfunction (restrictive).   5. Elevated left atrial pressure.   6. The left ventricle demonstrates global hypokinesis.   7. Global right ventricle has normal systolic function.The right ventricular size is mildly enlarged.   8. Left atrial size was moderately dilated.   9. Right atrial size was normal.  10.  The mitral valve is normal in structure. Mild mitral valve regurgitation.  11. The tricuspid valve is normal in structure. Tricuspid valve regurgitation is trivial.  12. The aortic valve is normal in structure. Aortic valve regurgitation is not visualized. No evidence of aortic valve sclerosis or stenosis.  13. The pulmonic valve was not well visualized.  Pulmonic valve regurgitation is not visualized.   Interventional Neuro Radiology - Cerebral Angiogram with Intervention 05/12/2019 S/P Lt common carotid arteriogram followed by complete revascularization of occluded LT MCA sup division mid M2 seg with x 1 pass with 63mx 40 mm solitaire X ret river device and penumbra aspiration with TICI 3 revascularization  ECG - SR rate 93 BPM. (See cardiology reading for complete details)  PHYSICAL EXAM   Temp:  [95.9 F (35.5 C)-99.1 F (37.3 C)] 98.1 F (36.7 C) (02/09 1000) Pulse Rate:  [63-129] 115 (02/09 1000) Resp:  [20-32] 29 (02/09 1000) BP: (92-117)/(57-81) 110/81 (02/09 0900) SpO2:  [96 %-100 %] 99 % (02/09 1000) Arterial Line BP: (88-127)/(51-88) 115/75 (02/09 0900) FiO2 (%):  [40 %-50 %] 40 % (02/09 0741) Weight:  [125.2 kg] 125.2 kg (02/09 0500)  General - Well nourished, well developed, intubated off sedation.  Ophthalmologic - fundi not visualized due to noncooperation.  Cardiovascular - Regular rhythm but tachycardia.  Neuro - intubated off sedation, eyes easily open on voice, not following commands. With eye opening, eyes in left gaze preference position, not able to cross midline, some tracking on the left, blinking to visual threat bilaterally, PERRL. Corneal reflex present, gag and cough present. Breathing over the vent.  Facial symmetry not able to test due to ET tube.  Tongue protrusion not cooperative. On pain stimulation, mild withdraw on RUE proximally, no significant movement BLEs or LUE. DTR 1+ and bilateral positive babinski. Sensation, coordination and gait not  tested.   ASSESSMENT/PLAN Mr. LLavarr President is a 34y.o. male with history of NICM EF 15-20%, CHF, DM, Obesity, tobacco use, OSA, and hx of cocaine use presenting with aphasia and right sided weakness. The patient received IV t-PA on Friday 04/21/2019 at 4:45 AM. Mechanical thrombectomy left M2.  Stroke:  L MCA infarct s/p tPA & IR with left M2 TICI3 reperfusion - embolic - likely secondary to severe cardiomyopathy with low EF  Code Stroke CT Head - left MCA hyperdensity.   CTA H&N - emergent large vessel occlusion at the Left MCA bifurcation.   CT Perfusion - 60 mL of penumbra detected.   CT head repeat - Small region of loss of gray-white differentiation in the left frontal operculum   MRI head - will consider once stable   2D Echo - EF < 20%   2D with contrast - possible LV apical thrombus 0.8 x 0.6  Sars Corona Virus 2 - negative  LDL - 203  HgbA1c - 6.5  VTE prophylaxis - heparin IV  aspirin 81 mg daily prior to admission, now on heparin IV per stroke protocol.  Therapy recommendations:  pending  Disposition:  Pending  Acute Hypoxemic Respiratory failure  Intubated  sedated   CCM on board  CXR - consistent with multifocal infection, edema, and/or ARDS  Cardiogenic vs Septic Shock  Fever, TMax 103.2->97.5->99   Leukocytosis, WBCs 21->15.6->20.4   CXR - consistent with multifocal infection, edema, and/or ARDS   On Meropenum and vanco 04/23/19>>  On hydrocortisone  U/A neg, Urine culture no growth   Blood cultures - pending    On levophed   LV thrombus Severe cardiomyopathy CHF   2D Echo - EF < 20%   2D with contrast - possible LV apical thrombus 0.8 x 0.6  On heparin IV  Cardiology on board  V-tach  Elevated troponin  On amiodarone drip  Troponin trending down 16274->14226->2409  Cardiology on board  May consider cardioversion if needed  AKI  Creatinine - 1.10->2.64->5.17->4.09->3.64  renal consult 04/23/19  Minimal  urinary output  On CRRT  Hypotension w/ shock Hx Hypertension  Home BP meds: Coreg, Entresto  Now on levophed  Vasopressin weaned off  SBP goal  >110  Stable  Shock liver   GI on board  AST/ALT - >10000/10132 ->1920/2219  INR 3.1->1.8-> 1.2  improving  Hyperlipidemia  Home Lipid lowering medication: Lipitor 40 mg daily  LDL 203, goal < 70  Current lipid lowering medication: Lipitor 80 mg daily   Continue statin at discharge  Diabetes  Home diabetic meds: insulin  HgbA1c 6.5, goal < 7.0  CBG monitoring  SSI  Still has hyperglycemia  200s  Dysphagia . Secondary to stroke . NPO . On tube feeding @ 50 . Speech on board  Tobacco abuse  Current smoker  Mom confirmed tobacco use  Smoking cessation counseling will be provided  Other Stroke Risk Factors  ETOH use, advised to drink no more than 1 alcoholic beverage per day.  Hx cocaine abuse  Obesity, Body mass index is 36.42 kg/m., recommend weight loss, diet and exercise as appropriate   Obstructive sleep apnea - uses Cpap  Substance Abuse Hx of cocaine use  Other Active Problems  Code status - Full code  Hyperkalemia - 7.2->...1.6->1.0->9.6->0.4  Metabolic acidosis, resolved  Depression on prozac  Hospital day # 4  This patient is critically ill and at significant risk of neurological worsening, death and care requires constant monitoring of vital signs, hemodynamics,respiratory and cardiac monitoring, extensive review of multiple databases, frequent neurological assessment, discussion with family, other specialists and medical decision making of high complexity. I spent 35 minutes of neurocritical care time  in the care of  this patient. I had long discussion with mom at bedside, updated pt current condition, treatment plan and potential prognosis, and answered all the questions. She expressed understanding and appreciation. I also discussed with Dr. Elsworth Soho.  Rosalin Hawking, MD PhD Stroke  Neurology 04/26/2019 10:03 AM   To contact Stroke Continuity provider, please refer to [email or web address] After hours, contact General Neurology

## 2019-04-26 NOTE — Progress Notes (Signed)
Patient ID: Carlos Satterly., male   DOB: 1985/08/02, 34 y.o.   MRN: [id number] [facility] Progress Note   Assessment/ Plan:   1. Acute kidney Injury: Anuric.  Consistent with ATN with ischemic/nephrotoxic etiologies and exacerbated by cardiogenic shock.  He does not have any evidence of renal recovery and I will continue CRRT with prescription to keep him "fluid even" until we have better hemodynamic stability when we can restart ultrafiltration. 2.  Shock: Cardiogenic versus septic with data so far favoring the latter (pulmonary versus urinary etiology).  On meropenem and vancomycin.  Remains pressor dependent and with higher LFTs today likely as a result of V. tach/transient hypotension yesterday. 3.  Acute CVA from right MCA occlusion: Status post TPA/thrombectomy by IR.  Likely with significant residual neurological injury. 4.  Acute hypoxic respiratory failure: Remains ventilator dependent with efforts at trying to volume unloading with CRRT.  Subjective:   With sustained ventricular tachycardia overnight and transient hypotension.   Objective:   BP 105/79   Pulse (!) 116   Temp 98.4 F (36.9 C)   Resp (!) 29   Ht 6' 1"  (1.854 m)   Wt 125.2 kg   SpO2 100%   BMI 36.42 kg/m   Intake/Output Summary (Last 24 hours) at 04/26/2019 0849 Last data filed at 04/26/2019 0800 Gross per 24 hour  Intake 2242.05 ml  Output 4108 ml  Net -1865.95 ml   Weight change: -2.8 kg  Physical Exam: Gen: Intubated, opens eyes to calling out his name CVS: Pulse regular rhythm, normal rate, S1 and S2 normal Resp: Anteriorly transmitted breath sounds on ventilator. Abd: Soft, obese, nontender Ext: Trace lower extremity edema, 1+ presacral edema  Imaging: DG CHEST PORT 1 VIEW  Result Date: 04/25/2019 CLINICAL DATA:  Respiratory failure, COVID-19 positivity EXAM: PORTABLE CHEST 1 VIEW COMPARISON:  04/23/2019 FINDINGS: Endotracheal tube, gastric catheter and bilateral jugular  central lines are again noted and stable. Cardiac shadow remains enlarged but accentuated by the portable technique. Previously seen airspace opacities have improved significantly with some mild persistent basilar changes. Small effusions are seen. No bony abnormality is noted. IMPRESSION: Overall improved aeration from the prior exam although persistent opacities and small effusions are noted. Electronically Signed   By: Inez Catalina M.D.   On: 04/25/2019 09:46   ECHOCARDIOGRAM LIMITED  Result Date: 04/25/2019    ECHOCARDIOGRAM LIMITED REPORT   Patient Name:   Carlos Brewer. Date of Exam: 04/25/2019 Medical Rec #:  [id number]                 Height:       73.0 in Accession #:    [id number]                Weight:       282.2 lb Date of Birth:  07/17/1985                 BSA:          2.49 m Patient Age:    33 years                  BP:           106/76 mmHg Patient Gender: M                         HR:           70 bpm. Exam Location:  Inpatient Procedure: Limited  Echo and Intracardiac Opacification Agent Indications:    Stoke I163.9; Apex not seen well on initial echo, evauluation                 for LV thrombus.  History:        Patient has prior history of Echocardiogram examinations, most                 recent 05/14/2019. Non-ischemic Cardiomyopathy and CHF; Risk                 Factors:Cocaine Abuse and Current Smoker.  Sonographer:    Dustin Flock Referring Phys: [place]  1. Left ventricular ejection fraction, by estimation, is <20%. The left ventricle has severely decreased function. The left ventricle demonstrates global hypokinesis. The left ventricular internal cavity size was severely dilated. Possible small 0.8 x 0.6 cm apical thrombus. Somewhat subtle finding, could confirm with cardiac MRI. However, if patient has had CVA, would be reasonable to anticoagulate given severity of LV dysfunction if no contraindications to anticoagulation.  2. Limited echo. FINDINGS   Left Ventricle: Left ventricular ejection fraction, by estimation, is <20%. The left ventricle has severely decreased function. The left ventricle demonstrates global hypokinesis. The left ventricular internal cavity size was severely dilated. There is no left ventricular hypertrophy. Loralie Champagne MD Electronically signed by Loralie Champagne MD Signature Date/Time: 04/25/2019/5:35:07 PM    Final     Labs: BMET Recent Labs  Lab 04/23/19 0945 04/23/19 1022 04/24/19 0435 04/24/19 2297 04/24/19 9892 04/24/19 1194 04/24/19 0911 04/24/19 1307 04/24/19 1611 04/24/19 1611 04/24/19 1708 04/24/19 1708 04/24/19 2023 04/24/19 2315 04/25/19 0358 04/25/19 1008 04/25/19 1600 04/25/19 2319 04/26/19 0158  NA 140   < > 138   < > 139   < > 137  --  137  --  139  --   --   --  138  --  138 135 135  K 4.6   < > 5.7*   < > 5.3*   < > 5.3*   < > 4.0   < > 4.3   < > 4.6 4.4 4.2 3.9 4.3 3.8 3.9  CL 109  --  98  --  98  --   --   --   --   --  98  --   --   --  93*  --  93*  --  96*  CO2 13*  --  20*  --  27  --   --   --   --   --  26  --   --   --  26  --  28  --  25  GLUCOSE 229*  --  276*  --  206*  --   --   --   --   --  159*  --   --   --  171*  --  171*  --  212*  BUN 28*  --  29*  --  28*  --   --   --   --   --  28*  --   --   --  31*  --  35*  --  42*  CREATININE 4.21*  --  5.17*  --  4.77*  --   --   --   --   --  4.32*  --   --   --  4.09*  --  3.91*  --  3.64*  CALCIUM  7.5*  --  7.1*  --  7.1*  --   --   --   --   --  7.3*  --   --   --  7.7*  --  7.6*  --  7.7*  PHOS  --   --  4.9*  --   --   --   --   --   --   --  4.5  --   --   --  4.0  --  4.9*  --  4.1   < > = values in this interval not displayed.   CBC Recent Labs  Lab 04/26/2019 0355 04/29/2019 0430 04/23/19 0208 04/23/19 0208 04/23/19 0945 04/23/19 1022 04/24/19 0843 04/24/19 0911 04/24/19 1611 04/25/19 0358 04/25/19 2319 04/26/19 0158  WBC 7.2   < > 21.9*   < > 16.2*  --  17.6*  --   --  15.6*  --  20.4*  NEUTROABS 4.3  --   19.2*  --  13.5*  --  15.8*  --   --   --   --   --   HGB 14.9   < > 15.4   < > 14.6   < > 13.7   < > 12.6* 12.5* 11.6* 12.7*  HCT 42.8   < > 45.2   < > 44.0   < > 39.8   < > 37.0* 36.5* 34.0* 37.2*  MCV 89.0   < > 90.8   < > 92.6  --  92.3  --   --  89.5  --  90.5  PLT 243   < > 171   < > 145*  --  137*  --   --  124*  --  157   < > = values in this interval not displayed.    Medications:    . sodium chloride   Intravenous Once  . chlorhexidine gluconate (MEDLINE KIT)  15 mL Mouth Rinse BID  . Chlorhexidine Gluconate Cloth  6 each Topical Q0600  . Chlorhexidine Gluconate Cloth  6 each Topical Q0600  . feeding supplement (PRO-STAT SUGAR FREE 64)  60 mL Per Tube TID  . fentaNYL (SUBLIMAZE) injection  50 mcg Intravenous Once  . FLUoxetine  20 mg Per Tube Daily  . hydrocortisone sod succinate (SOLU-CORTEF) inj  50 mg Intravenous Q12H  . insulin aspart  0-20 Units Subcutaneous Q4H  . mouth rinse  15 mL Mouth Rinse 10 times per day  . mupirocin ointment  1 application Nasal BID  . pantoprazole (PROTONIX) IV  40 mg Intravenous Daily  . sodium chloride flush  10-40 mL Intracatheter Q12H   Elmarie Shiley, MD 04/26/2019, 8:49 AM

## 2019-04-26 NOTE — Progress Notes (Signed)
NAME:  Carlos Brewer., MRN:  [id number], DOB:  12/19/1985, LOS: 4 ADMISSION DATE:  04/24/2019, CONSULTATION DATE:  04/26/2019 REFERRING MD:  Dr. Leonel Ramsay, CHIEF COMPLAINT:  Slurred speech  Brief History   34 yo male smoker found to have slurred speech and Rt sided weakness.  CT head showed M2 occlusion.  Ultimately treated with thrombolytic and thrombectomy by IR.  Required intubation for airway protection.    Past Medical History  Systolic CHF with non ischemic CM, Cocaine abuse, OSA, DM Hx of CHF (EF 15%)  Hx myocarditis Smoker 1/2 ppd   Significant Hospital Events   2/05 Admit, tPA, IR thrombectomy 2/6 100% on fvent at 2300  Overnight requiring increasing levophed and phenylephrine.    2/6: switched to levophed, epi, started antibiotics, started on CRRT.   Consults:  Neuro IR  Procedures:  ETT 2/05 >>  LIJ CVL 2/5 >> RIJ HD cath 2/6 >>  2/5-2/6:  TPA given at 428 am (total of 90 Mg) 520 am went to IR  S/P Lt common carotid arteriogram followed by complete revascularization of occluded LT MCA sup division mid M2 seg with x 1 pass with 13mx 40 mm solitaire X ret river device and penumbra aspiration with TICI 3 revascularization   Significant Diagnostic Tests:  CT angio head/neck 2/05 >> occlusion of Lt MCA bifurcation Echo 2/05 >> EF less than 20%, cannot rule out apical thrombus Limited echo 2/8 >> Possible small 0.8 x 0.6 cm apical thrombus  Micro Data:  SARS CoV2 PCR 2/05 >> negative Influenza PCR 2/05 >> negative Urine 2/6 >> ng resp 2/6 >> nml flora BC 2/6 >>ng  Antimicrobials:  mero 2/7 >> vanc 2/6 >> 2/9  Interim history/subjective:   Critically ill, intubated,On CRRT Anuric Early a.m., developed wide-complex tachycardia and hypotension, started on amiodarone drip     Objective   Blood pressure 110/81, pulse (!) 115, temperature 98.2 F (36.8 C), resp. rate (!) 29, height '6\' 1"'$  (1.854 m), weight 125.2 kg, SpO2 99 %. CVP:  [1 mmHg-21  mmHg] 1 mmHg  Vent Mode: PCV FiO2 (%):  [40 %-50 %] 40 % Set Rate:  [24 bmp] 24 bmp PEEP:  [5 cmH20] 5 cmH20 Pressure Support:  [10 cmH20] 10 cmH20 Plateau Pressure:  [20 cmH20-25 cmH20] 25 cmH20   Intake/Output Summary (Last 24 hours) at 04/26/2019 0947 Last data filed at 04/26/2019 0900 Gross per 24 hour  Intake 2355.03 ml  Output 4114 ml  Net -1758.97 ml   Filed Weights   04/24/19 0500 04/25/19 0500 04/26/19 0500  Weight: 133 kg 128 kg 125.2 kg    Examination:  General -young obese well-built, no distress Eyes - upward gaze no nystagmus ENT - ETT in place , mild pallor, no icterus Cardiac -S1-S2 distant, no murmur Chest - no wheeze, bilateral ventilated breath sounds Abdomen - soft, non tender, + bowel sounds Extremities - no cyanosis, clubbing, or edema Skin - no rashes Neuro -opens eyes to name, does not follow commands, right hemiplegia, moves left arm and leg to painful stimulus, bilateral upward gaze   Chest x-ray 2/9 personally reviewed shows lines and tubes in position, improved aeration from prior although bilateral interstitial alveolar opacities and small effusions persist   Labs show normal electrolytes, increase leukocytosis, stable thrombocytopenia, LFTs which were decreasing has increased slightly Resolved Hospital Problem list     Assessment & Plan:  Shock, septic + cardiogenic:   Increased pressor requirements 2/9 due to arrhythmia/amiodarone Possibly UTI vs PNA.  Remains on mero, discontinue vancomycin, negative cultures Ct  hydrocortisone to 50 every 12  #Cardiogenic shock LV apical thrombus Non ischemic CM with chronic systolic CHF. 2/9 wide-complex tachycardia Dx 2018 - followed by [facility] in [place] - continue lipitor, digoxin - hold outpt coreg, zaroxolyn, entresto, aldactone, torsemide for now Cardiology following, continue IV amiodarone -IV heparin for apical thrombus  Acute hypoxemic respiratory failure: B infiltrates, ARDS vs  cardiogenic pulm edema. Oxygenation has improved volume removal with CRRT.  LTV ventilation as tolerates.  -Lowered PEEP to 5 -Not ready for spontaneous breathing trials due to high pressor requirements  AKI;  Continue CRRT per renal, keep equal balance Replete hypocalcemia as needed  L MCA CVA: Likely embolic, s/p tpa 799 AM on 2/5 ,S/p IR clot removal.  Acute metabolic encephalopathy:  Likely metabolic (AKI, shock, transaminitis), combined with residual fentanyl.  -Using intermittent fentanyl, goal RASS 0   Transaminitis 2/2 hypoperfusion (shock liver) Coagulopathy - INR improved  (FFP x 2  2/6) , LFTs slight bump 2/9 due to increased hypotension Appreciate GI consult.  Caution with acetaminophen if needed for fever.  Follow LFTs   Hx of cocaine abuse. depression. - not active per UDS  - continue prozac   DM type II poorly controlled. - SSI -Add 10 units Levemir daily -CBG 1 40-180 acceptable  Summary-shock is worse which may be related to arrhythmia overnight, no other source of sepsis identified, culture negative  Best practice:  Diet: NPO, start TFs DVT prophylaxis: SCDs GI prophylaxis: protonix Mobility: bed rest Code Status: full code Disposition: ICU Family -fianc Shakita updated 2/9.Plan for aggressive care, esp given his young age.  He has many supportive family and also has young children.    The patient is critically ill with multiple organ systems failure and requires high complexity decision making for assessment and support, frequent evaluation and titration of therapies, application of advanced monitoring technologies and extensive interpretation of multiple databases. Critical Care Time devoted to patient care services described in this note independent of APP/resident  time is 35 minutes.  Care was coordinated with other consultants including cardiology and neurology   [name] MD. FCCP. [facility]  If no response to  pager , please call [phone]   04/26/2019

## 2019-04-26 NOTE — Progress Notes (Signed)
Subjective: No acute events.  Objective: Vital signs in last 24 hours: Temp:  [95.9 F (35.5 C)-99.1 F (37.3 C)] 98.1 F (36.7 C) (02/09 1300) Pulse Rate:  [36-129] 70 (02/09 1700) Resp:  [20-32] 23 (02/09 1700) BP: (74-118)/(40-99) 109/90 (02/09 1700) SpO2:  [96 %-100 %] 100 % (02/09 1700) Arterial Line BP: (88-160)/(51-88) 120/82 (02/09 1700) FiO2 (%):  [40 %-50 %] 40 % (02/09 1556) Weight:  [125.2 kg] 125.2 kg (02/09 0500) Last BM Date: 04/26/19  Intake/Output from previous day: 02/08 0701 - 02/09 0700 In: 2133 [I.V.:843.5; NG/GT:739.2; IV Piggyback:550.3] Out: 4031 [Urine:12] Intake/Output this shift: Total I/O In: 1423.8 [I.V.:824; NG/GT:500; IV Piggyback:99.9] Out: 1376 [Other:1351; Stool:25]  General appearance: intubated GI: soft, non-tender; bowel sounds normal; no masses,  no organomegaly  Lab Results: Recent Labs    04/24/19 0843 04/24/19 0911 04/25/19 0358 04/25/19 2319 04/26/19 0158  WBC 17.6*  --  15.6*  --  20.4*  HGB 13.7   < > 12.5* 11.6* 12.7*  HCT 39.8   < > 36.5* 34.0* 37.2*  PLT 137*  --  124*  --  157   < > = values in this interval not displayed.   BMET Recent Labs    04/25/19 0358 04/25/19 1008 04/25/19 1600 04/25/19 2319 04/26/19 0158  NA 138  --  138 135 135  K 4.2   < > 4.3 3.8 3.9  CL 93*  --  93*  --  96*  CO2 26  --  28  --  25  GLUCOSE 171*  --  171*  --  212*  BUN 31*  --  35*  --  42*  CREATININE 4.09*  --  3.91*  --  3.64*  CALCIUM 7.7*  --  7.6*  --  7.7*   < > = values in this interval not displayed.   LFT Recent Labs    04/25/19 1008 04/25/19 1600 04/26/19 0158  PROT 5.9*  --  6.0*  ALBUMIN 2.9*   < > 2.8*  AST 1,920*  --  2,846*  ALT 2,219*  --  4,801*  ALKPHOS 94  --  104  BILITOT 3.9*  --  3.1*  BILIDIR 1.5*  --   --   IBILI 2.4*  --   --    < > = values in this interval not displayed.   PT/INR Recent Labs    04/24/19 0843 04/26/19 0158  LABPROT 20.3* 14.7  INR 1.8* 1.2   Hepatitis Panel No  results for input(s): HEPBSAG, HCVAB, HEPAIGM, HEPBIGM in the last 72 hours. C-Diff No results for input(s): CDIFFTOX in the last 72 hours. Fecal Lactopherrin No results for input(s): FECLLACTOFRN in the last 72 hours.  Studies/Results: DG Chest Port 1 View  Result Date: 04/26/2019 CLINICAL DATA:  Acute respiratory failure, ET tube, post stroke. EXAM: PORTABLE CHEST 1 VIEW COMPARISON:  04/25/2019 9:32 a.m. FINDINGS: Endotracheal tube projects approximately 4.5 cm above the carina similar position between the clavicular heads as compared to most recent study. Left-sided central venous catheter terminates in proximal SVC. Right IJ catheter dual lumen terminating in the right brachiocephalic vein. Tip just at the brachiocephalic junction. Cardiomediastinal contours remain enlarged and are largely obscured particularly in the left chest. Interstitial prominence/congestive changes. Similar to prior study. Visualized skeletal structures are unremarkable. IMPRESSION: 1. Lines and tubes as described above. 2. Cardiomegaly with basilar airspace disease, likely associated with effusions. 3. Interstitial and alveolar opacities elsewhere may represent a combination of infection and edema and  are little changed. Electronically Signed   By: Zetta Bills M.D.   On: 04/26/2019 08:54   DG CHEST PORT 1 VIEW  Result Date: 04/25/2019 CLINICAL DATA:  Respiratory failure, COVID-19 positivity EXAM: PORTABLE CHEST 1 VIEW COMPARISON:  04/23/2019 FINDINGS: Endotracheal tube, gastric catheter and bilateral jugular central lines are again noted and stable. Cardiac shadow remains enlarged but accentuated by the portable technique. Previously seen airspace opacities have improved significantly with some mild persistent basilar changes. Small effusions are seen. No bony abnormality is noted. IMPRESSION: Overall improved aeration from the prior exam although persistent opacities and small effusions are noted. Electronically Signed    By: Inez Catalina M.D.   On: 04/25/2019 09:46   ECHOCARDIOGRAM LIMITED  Result Date: 04/25/2019    ECHOCARDIOGRAM LIMITED REPORT   Patient Name:   Carlos Brewer. Date of Exam: 04/25/2019 Medical Rec #:  [id number]                 Height:       73.0 in Accession #:    [id number]                Weight:       282.2 lb Date of Birth:  04/14/1985                 BSA:          2.49 m Patient Age:    34 years                  BP:           106/76 mmHg Patient Gender: M                         HR:           70 bpm. Exam Location:  Inpatient Procedure: Limited Echo and Intracardiac Opacification Agent Indications:    Stoke I163.9; Apex not seen well on initial echo, evauluation                 for LV thrombus.  History:        Patient has prior history of Echocardiogram examinations, most                 recent 04/21/2019. Non-ischemic Cardiomyopathy and CHF; Risk                 Factors:Cocaine Abuse and Current Smoker.  Sonographer:    Dustin Flock Referring Phys: [place]  1. Left ventricular ejection fraction, by estimation, is <20%. The left ventricle has severely decreased function. The left ventricle demonstrates global hypokinesis. The left ventricular internal cavity size was severely dilated. Possible small 0.8 x 0.6 cm apical thrombus. Somewhat subtle finding, could confirm with cardiac MRI. However, if patient has had CVA, would be reasonable to anticoagulate given severity of LV dysfunction if no contraindications to anticoagulation.  2. Limited echo. FINDINGS  Left Ventricle: Left ventricular ejection fraction, by estimation, is <20%. The left ventricle has severely decreased function. The left ventricle demonstrates global hypokinesis. The left ventricular internal cavity size was severely dilated. There is no left ventricular hypertrophy. Loralie Champagne MD Electronically signed by Loralie Champagne MD Signature Date/Time: 04/25/2019/5:35:07 PM    Final     Medications:   Scheduled: . sodium chloride   Intravenous Once  . chlorhexidine gluconate (MEDLINE KIT)  15 mL Mouth Rinse BID  . Chlorhexidine Gluconate Cloth  6 each Topical Q0600  . Chlorhexidine Gluconate Cloth  6 each Topical Q0600  . feeding supplement (PRO-STAT SUGAR FREE 64)  60 mL Per Tube TID  . fentaNYL (SUBLIMAZE) injection  50 mcg Intravenous Once  . hydrocortisone sod succinate (SOLU-CORTEF) inj  50 mg Intravenous Q12H  . insulin aspart  0-20 Units Subcutaneous Q4H  . insulin detemir  10 Units Subcutaneous Daily  . mouth rinse  15 mL Mouth Rinse 10 times per day  . mupirocin ointment  1 application Nasal BID  . pantoprazole sodium  40 mg Per Tube Q1200  . sodium chloride flush  10-40 mL Intracatheter Q12H   Continuous: .  prismasol BGK 4/2.5 500 mL/hr at 04/26/19 1548  .  prismasol BGK 4/2.5 200 mL/hr at 04/25/19 2349  . sodium chloride    . sodium chloride Stopped (04/26/19 1444)  . amiodarone 30 mg/hr (04/26/19 1700)  . amiodarone 150 mg (04/26/19 1708)  . feeding supplement (VITAL HIGH PROTEIN) 50 mL/hr at 04/26/19 0700  . heparin 1,950 Units/hr (04/26/19 1700)  . meropenem (MERREM) IV Stopped (04/26/19 1514)  . norepinephrine (LEVOPHED) Adult infusion 32 mcg/min (04/26/19 1700)  . prismasol BGK 4/2.5 2,000 mL/hr at 04/26/19 1557  . vasopressin (PITRESSIN) infusion - *FOR SHOCK* 0.03 Units/min (04/26/19 1700)    Assessment/Plan: 1) Shock liver. 2) CVA. 3) CHF.   The patient's liver enzymes did increase.  He was experiencing issues with V-Tach.  Vital signs were relatively preserved with his blood pressure, but there were a couple of readings that did show hypotension.  It is likely he suffered another shock liver event.  Fortunately his synthetic function is normal.  Plan: 1) Continue supportive care. 2) Management of arrhythmia and CHF per cardiology.  LOS: 4 days   Angles Trevizo D 04/26/2019, 5:16 PM

## 2019-04-26 NOTE — Progress Notes (Signed)
0148- Patient's heart rhythm changed from 1st degree heart block to v-tach rate in the 130s. Bedside EKG performed and CCM notified. Morning labs drawn at this time, troponin and phosphorous added to the morning labs. New order to give patient bolus dose only of amiorodone and 2G of calcium gluconate.   77- Cardiology notified of patient's status change. Only recommendation per Dr. Aundra Dubin was to give patient another bolus dose of amiorodone and start patient on a continuous drip afterwards. CCM made aware of recommendation and placed order.   0308- troponin level critical 2409, CCM made aware, Cardiology also made aware. Patient's rate still in 120s despite second dose of amiodorone, Cardiology recommending cardioversion. CCM made aware of second recommendation. Second EKG performed at 0326, based off of results, cardioversion held off for now. Verbal order from [place] with CCM to attempt to wean down the levophed to help with tachycardia. SBP goal per neurology is 110. New additional goal to keep MAP >70

## 2019-04-26 NOTE — Progress Notes (Signed)
ANTICOAGULATION CONSULT NOTE  Pharmacy Consult for heparin Indication: stroke, LV thrombus   No Known Allergies  Patient Measurements: Height: 6\' 1"  (185.4 cm) Weight: 276 lb 0.3 oz (125.2 kg) IBW/kg (Calculated) : 79.9 Heparin Dosing Weight: 107 kg  Vital Signs: Temp: 98.1 F (36.7 C) (02/09 1000) Temp Source: Bladder (02/09 0400) BP: 110/81 (02/09 0900) Pulse Rate: 115 (02/09 1000)  Labs: Recent Labs    04/23/19 1122 04/23/19 1237 04/23/19 1356 04/23/19 2249 04/24/19 0843 04/24/19 0911 04/25/19 0358 04/25/19 0358 04/25/19 1600 04/25/19 2319 04/26/19 0155 04/26/19 0158 04/26/19 0927  HGB  --    < >  --    < > 13.7   < > 12.5*   < >  --  11.6*  --  12.7*  --   HCT  --    < >  --    < > 39.8   < > 36.5*  --   --  34.0*  --  37.2*  --   PLT  --   --   --   --  137*  --  124*  --   --   --   --  157  --   LABPROT  --   --   --   --  20.3*  --   --   --   --   --   --  14.7  --   INR  --   --   --   --  1.8*  --   --   --   --   --   --  1.2  --   HEPARINUNFRC  --   --   --   --   --   --   --   --   --   --  <0.10*  --  <0.10*  CREATININE  --   --   --    < > 4.77*   < > 4.09*  --  3.91*  --   --  3.64*  --   CKTOTAL  --   --  602*  --   --   --   --   --   --   --   --   --   --   TROPONINIHS 14,226*  --   --   --   --   --   --   --   --   --   --  2,409*  --    < > = values in this interval not displayed.    Estimated Creatinine Clearance: 39.6 mL/min (A) (by C-G formula based on SCr of 3.64 mg/dL (H)).  Assessment: 34 yo m initially presented with aphasia and right sided weaknesss. Found to have left MCA infarct - received TPA and revascularization with IR on 2/5. Now found to have LV apical thrombus on ECHO. To begin heparin with no bolus and low goal.  Heparin level undetectable on 1600 units/hr. No issues with line or bleeding reported per RN.  Goal of Therapy:  Heparin level 0.3 - 0.5 units/ml Monitor platelets by anticoagulation protocol: Yes   Plan:   Increase heparin gtt to 1950 units/hr. Will f/u 6 hr heparin level  Alanda Slim, PharmD, [facility] Clinical Pharmacist Please see AMION for all Pharmacists' Contact Phone Numbers 04/26/2019, 10:35 AM

## 2019-04-26 NOTE — Progress Notes (Signed)
Nephrology paged after an ABG was drawn. Patient on bicarb for replacement fluid. Bicarb on ABG was 30.2. New order to switch replacement fluid to the normal bags of 4K/2.5Ca at 230mL.

## 2019-04-26 NOTE — Progress Notes (Addendum)
Pt rhythm changed to ventricular bigeminy for appx 1 minute after which he converted to NSR with rate in the 70s.  Cards and CCM notified and strip saved in the chart.  No new orders, may consider amio bolus if repeated and sustained (per Cards).

## 2019-04-26 NOTE — Progress Notes (Signed)
eLink Physician-Brief Progress Note Patient Name: Carlos Brewer. DOB: [birth year] MRN: [id number]   Date of Service  04/26/2019  HPI/Events of Note  Arrhythmia persists with RVR  eICU Interventions  Amiodarone load  + infusion.        Kerry Kass Kaylah Chiasson 04/26/2019, 2:51 AM

## 2019-04-26 NOTE — Progress Notes (Addendum)
  Pt has had 2 episodes of bigeminy, several minutes in duration. Will rebolus amio. If happens again, will need to rebolus and increase gtt back up to 60 mg/hr.   Will recheck BMET as well, to make sure K+ 4.0   Rosaria Ferries, PA-C 04/26/2019 5:05 PM

## 2019-04-26 NOTE — Progress Notes (Signed)
SLP Cancellation Note  Patient Details Name: Carlos Brewer. MRN: [id number] DOB: 03-04-1986   Cancelled treatment:       Reason Eval/Treat Not Completed: Patient not medically ready, remains on vent. SLP to sign off for now. Please reorder when ready.    Osie Bond., M.A. [facility] Pager [phone] Office [phone]  04/26/2019, 7:57 AM

## 2019-04-27 ENCOUNTER — Inpatient Hospital Stay (HOSPITAL_COMMUNITY): Payer: No Typology Code available for payment source

## 2019-04-27 DIAGNOSIS — N179 Acute kidney failure, unspecified: Secondary | ICD-10-CM | POA: Diagnosis not present

## 2019-04-27 DIAGNOSIS — I428 Other cardiomyopathies: Secondary | ICD-10-CM

## 2019-04-27 DIAGNOSIS — R509 Fever, unspecified: Secondary | ICD-10-CM

## 2019-04-27 DIAGNOSIS — I639 Cerebral infarction, unspecified: Secondary | ICD-10-CM | POA: Diagnosis not present

## 2019-04-27 DIAGNOSIS — K7200 Acute and subacute hepatic failure without coma: Secondary | ICD-10-CM | POA: Diagnosis not present

## 2019-04-27 DIAGNOSIS — A419 Sepsis, unspecified organism: Secondary | ICD-10-CM | POA: Diagnosis not present

## 2019-04-27 DIAGNOSIS — R6521 Severe sepsis with septic shock: Secondary | ICD-10-CM | POA: Diagnosis not present

## 2019-04-27 DIAGNOSIS — I6602 Occlusion and stenosis of left middle cerebral artery: Secondary | ICD-10-CM | POA: Diagnosis not present

## 2019-04-27 DIAGNOSIS — I63312 Cerebral infarction due to thrombosis of left middle cerebral artery: Secondary | ICD-10-CM | POA: Diagnosis not present

## 2019-04-27 DIAGNOSIS — J9601 Acute respiratory failure with hypoxia: Secondary | ICD-10-CM | POA: Diagnosis not present

## 2019-04-27 DIAGNOSIS — R579 Shock, unspecified: Secondary | ICD-10-CM | POA: Diagnosis not present

## 2019-04-27 LAB — RENAL FUNCTION PANEL
Albumin: 3 g/dL — ABNORMAL LOW (ref 3.5–5.0)
Albumin: 2.8 g/dL — ABNORMAL LOW (ref 3.5–5.0)
Anion gap: 14 (ref 5–15)
Anion gap: 13 (ref 5–15)
BUN: 44 mg/dL — ABNORMAL HIGH (ref 6–20)
BUN: 48 mg/dL — ABNORMAL HIGH (ref 6–20)
CO2: 21 mmol/L — ABNORMAL LOW (ref 22–32)
CO2: 23 mmol/L (ref 22–32)
Calcium: 8.5 mg/dL — ABNORMAL LOW (ref 8.9–10.3)
Calcium: 8.4 mg/dL — ABNORMAL LOW (ref 8.9–10.3)
Chloride: 101 mmol/L (ref 98–111)
Chloride: 100 mmol/L (ref 98–111)
Creatinine, Ser: 3.1 mg/dL — ABNORMAL HIGH (ref 0.61–1.24)
Creatinine, Ser: 3.09 mg/dL — ABNORMAL HIGH (ref 0.61–1.24)
GFR calc Af Amer: 29 mL/min — ABNORMAL LOW (ref >60)
GFR calc Af Amer: 29 mL/min — ABNORMAL LOW (ref >60)
GFR calc non Af Amer: 25 mL/min — ABNORMAL LOW (ref >60)
GFR calc non Af Amer: 25 mL/min — ABNORMAL LOW (ref >60)
Glucose, Bld: 217 mg/dL — ABNORMAL HIGH (ref 70–99)
Glucose, Bld: 160 mg/dL — ABNORMAL HIGH (ref 70–99)
Phosphorus: 4.1 mg/dL (ref 2.5–4.6)
Phosphorus: 3 mg/dL (ref 2.5–4.6)
Potassium: 4.8 mmol/L (ref 3.5–5.1)
Potassium: 4.3 mmol/L (ref 3.5–5.1)
Sodium: 136 mmol/L (ref 135–145)
Sodium: 136 mmol/L (ref 135–145)

## 2019-04-27 LAB — GLUCOSE, CAPILLARY
Glucose-Capillary: 195 mg/dL — ABNORMAL HIGH (ref 70–99)
Glucose-Capillary: 206 mg/dL — ABNORMAL HIGH (ref 70–99)
Glucose-Capillary: 282 mg/dL — ABNORMAL HIGH (ref 70–99)
Glucose-Capillary: 159 mg/dL — ABNORMAL HIGH (ref 70–99)
Glucose-Capillary: 196 mg/dL — ABNORMAL HIGH (ref 70–99)
Glucose-Capillary: 268 mg/dL — ABNORMAL HIGH (ref 70–99)

## 2019-04-27 LAB — HEPATIC FUNCTION PANEL
ALT: 3520 U/L — ABNORMAL HIGH (ref 0–44)
AST: 1059 U/L — ABNORMAL HIGH (ref 15–41)
Albumin: 2.9 g/dL — ABNORMAL LOW (ref 3.5–5.0)
Alkaline Phosphatase: 128 U/L — ABNORMAL HIGH (ref 38–126)
Bilirubin, Direct: 1.8 mg/dL — ABNORMAL HIGH (ref 0.0–0.2)
Indirect Bilirubin: 1.4 mg/dL — ABNORMAL HIGH (ref 0.3–0.9)
Total Bilirubin: 3.2 mg/dL — ABNORMAL HIGH (ref 0.3–1.2)
Total Protein: 6.6 g/dL (ref 6.5–8.1)

## 2019-04-27 LAB — HEPARIN LEVEL (UNFRACTIONATED)
Heparin Unfractionated: 0.53 IU/mL (ref 0.30–0.70)
Heparin Unfractionated: 0.44 IU/mL (ref 0.30–0.70)
Heparin Unfractionated: 0.81 IU/mL — ABNORMAL HIGH (ref 0.30–0.70)
Heparin Unfractionated: 0.88 IU/mL — ABNORMAL HIGH (ref 0.30–0.70)

## 2019-04-27 LAB — COOXEMETRY PANEL
Carboxyhemoglobin: 1.1 % (ref 0.5–1.5)
Methemoglobin: 0.7 % (ref 0.0–1.5)
O2 Saturation: 98.1 %
Total hemoglobin: 13.5 g/dL (ref 12.0–16.0)

## 2019-04-27 LAB — MAGNESIUM: Magnesium: 2.7 mg/dL — ABNORMAL HIGH (ref 1.7–2.4)

## 2019-04-27 MED ORDER — INSULIN ASPART 100 UNIT/ML ~~LOC~~ SOLN
4.0000 [IU] | SUBCUTANEOUS | Status: DC
  Administered 2019-04-27 – 2019-05-08 (×58): 4 [IU] via SUBCUTANEOUS

## 2019-04-27 MED ORDER — INSULIN ASPART 100 UNIT/ML ~~LOC~~ SOLN
4.0000 [IU] | Freq: Four times a day (QID) | SUBCUTANEOUS | Status: DC
  Administered 2019-04-27 (×2): 4 [IU] via SUBCUTANEOUS

## 2019-04-27 MED ORDER — INSULIN ASPART 100 UNIT/ML ~~LOC~~ SOLN: 4.0000 [IU] | SUBCUTANEOUS | Status: DC

## 2019-04-27 MED ORDER — INSULIN ASPART 100 UNIT/ML ~~LOC~~ SOLN: 4.0000 [IU] | Freq: Four times a day (QID) | SUBCUTANEOUS | Status: DC

## 2019-04-27 MED ORDER — INSULIN DETEMIR 100 UNIT/ML ~~LOC~~ SOLN
20.0000 [IU] | Freq: Every day | SUBCUTANEOUS | Status: DC
  Administered 2019-04-27 – 2019-05-07 (×11): 20 [IU] via SUBCUTANEOUS
  Filled 2019-04-27 (×11): qty 0.2

## 2019-04-27 NOTE — Progress Notes (Signed)
ANTICOAGULATION CONSULT NOTE - Follow Up Consult  Pharmacy Consult for heparin Indication: stroke and LV thrombus  Labs: Recent Labs    04/24/19 0843 04/24/19 0911 04/25/19 0358 04/25/19 0358 04/25/19 1600 04/25/19 2319 04/26/19 0155 04/26/19 0158 04/26/19 0927 04/26/19 1700 04/26/19 1710 04/27/19 0047  HGB 13.7   < > 12.5*   < >  --  11.6*  --  12.7*  --   --   --   --   HCT 39.8   < > 36.5*  --   --  34.0*  --  37.2*  --   --   --   --   PLT 137*  --  124*  --   --   --   --  157  --   --   --   --   LABPROT 20.3*  --   --   --   --   --   --  14.7  --   --   --   --   INR 1.8*  --   --   --   --   --   --  1.2  --   --   --   --   HEPARINUNFRC  --   --   --   --   --   --    < >  --  <0.10* 0.20*  --  0.53  CREATININE 4.77*   < > 4.09*   < > 3.91*  --   --  3.64*  --   --  3.18*  --   TROPONINIHS  --   --   --   --   --   --   --  2,409*  --   --   --   --    < > = values in this interval not displayed.    Assessment: 34yo male now slightly supratherapeutic on heparin after rate change.  Goal of Therapy:  Heparin level 0.3-0.5 units/ml   Plan:  Will decrease heparin gtt by 1 unit/kg/hr to 2000 units/hr and check level in 6-8 hours.    Wynona Neat, PharmD, BCPS  04/27/2019,1:21 AM

## 2019-04-27 NOTE — Progress Notes (Addendum)
The patient has been seen in conjunction with Cecilie Kicks, NP. All aspects of care have been considered and discussed. The patient has been personally interviewed, examined, and all clinical data has been reviewed.   Mother is at bedside.  When challenged physically and verbally he is unable to focus but has less disconjugation of gaze.  Continue support including IV amiodarone for rhythm control.  Neurological recovery is the most important prognostic indicator at this time.   Progress Note  Patient Name: Carlos Brewer. Date of Encounter: 04/27/2019  Primary Cardiologist: Ida Rogue, MD  AHF:  Dr. Haroldine Laws  Subjective   Sedated on vent   Inpatient Medications    Scheduled Meds: . sodium chloride   Intravenous Once  . chlorhexidine gluconate (MEDLINE KIT)  15 mL Mouth Rinse BID  . Chlorhexidine Gluconate Cloth  6 each Topical Q0600  . feeding supplement (PRO-STAT SUGAR FREE 64)  60 mL Per Tube TID  . fentaNYL (SUBLIMAZE) injection  50 mcg Intravenous Once  . hydrocortisone sod succinate (SOLU-CORTEF) inj  50 mg Intravenous Q12H  . insulin aspart  0-20 Units Subcutaneous Q4H  . insulin detemir  10 Units Subcutaneous Daily  . mouth rinse  15 mL Mouth Rinse 10 times per day  . mupirocin ointment  1 application Nasal BID  . pantoprazole sodium  40 mg Per Tube Q1200  . sodium chloride flush  10-40 mL Intracatheter Q12H   Continuous Infusions: .  prismasol BGK 4/2.5 500 mL/hr at 04/27/19 0301  .  prismasol BGK 4/2.5 200 mL/hr at 04/27/19 0245  . sodium chloride    . sodium chloride Stopped (04/27/19 0700)  . amiodarone 30 mg/hr (04/27/19 0800)  . feeding supplement (VITAL HIGH PROTEIN) 50 mL/hr at 04/27/19 0700  . heparin 2,000 Units/hr (04/27/19 0800)  . meropenem (MERREM) IV Stopped (04/27/19 6962)  . norepinephrine (LEVOPHED) Adult infusion 18 mcg/min (04/27/19 0800)  . prismasol BGK 4/2.5 2,000 mL/hr at 04/27/19 0848  . vasopressin (PITRESSIN)  infusion - *FOR SHOCK* 0.03 Units/min (04/27/19 0800)   PRN Meds: sodium chloride, bisacodyl, docusate, fentaNYL, fentaNYL (SUBLIMAZE) injection, heparin, heparin, labetalol **AND** [DISCONTINUED] niCARDipine, midazolam, sodium chloride flush   Vital Signs    Vitals:   04/27/19 0630 04/27/19 0645 04/27/19 0700 04/27/19 0800  BP: (!) 126/93 (!) 124/92 (!) 130/92 (!) 126/100  Pulse: 79 79 79 85  Resp: (!) 24 (!) 24 (!) 24 (!) 21  Temp: 98.6 F (37 C) 98.6 F (37 C) 98.6 F (37 C) 99 F (37.2 C)  TempSrc:      SpO2: 100% 100% 100% 100%  Weight:      Height:        Intake/Output Summary (Last 24 hours) at 04/27/2019 0901 Last data filed at 04/27/2019 0800 Gross per 24 hour  Intake 3388.97 ml  Output 3354 ml  Net 34.97 ml   Last 3 Weights 04/27/2019 04/26/2019 04/25/2019  Weight (lbs) 274 lb 14.6 oz 276 lb 0.3 oz 282 lb 3 oz  Weight (kg) 124.7 kg 125.2 kg 128 kg      Telemetry    ST some PVCs occ though lst PM run of Bigeminy  - Personally Reviewed  ECG    ST 119 LAD Q waves in III, AVF  - Personally Reviewed  Physical Exam   GEN: No acute distress.   Neck: No JVD Cardiac: RRR, no murmurs, rubs, or gallops.  Respiratory: Clear to auscultation bilaterally. GI: Soft, nontender, non-distended  MS: No edema; No  deformity. Neuro:  Nonfocal  Psych: Normal affect   Labs    High Sensitivity Troponin:   Recent Labs  Lab 04/23/19 0945 04/23/19 1122 04/26/19 0158  TROPONINIHS 16,274* 14,226* 2,409*      Chemistry Recent Labs  Lab 04/25/19 1008 04/25/19 1600 04/26/19 0158 04/26/19 1710 04/27/19 0427  NA  --    < > 135 136 136  K 3.9   < > 3.9 4.7 4.8  CL  --    < > 96* 99 101  CO2  --    < > 25 23 21*  GLUCOSE  --    < > 212* 231* 217*  BUN  --    < > 42* 42* 44*  CREATININE  --    < > 3.64* 3.18* 3.10*  CALCIUM  --    < > 7.7* 8.2* 8.5*  PROT 5.9*  --  6.0*  --  6.6  ALBUMIN 2.9*   < > 2.8* 2.9* 2.9*  3.0*  AST 1,920*  --  2,846*  --  1,059*  ALT 2,219*   --  4,801*  --  3,520*  ALKPHOS 94  --  104  --  128*  BILITOT 3.9*  --  3.1*  --  3.2*  GFRNONAA  --    < > 21* 24* 25*  GFRAA  --    < > 24* 28* 29*  ANIONGAP  --    < > 14 14 14    < > = values in this interval not displayed.     Hematology Recent Labs  Lab 04/24/19 1423 04/24/19 0911 04/25/19 0358 04/25/19 2319 04/26/19 0158  WBC 17.6*  --  15.6*  --  20.4*  RBC 4.31  --  4.08*  --  4.11*  HGB 13.7   < > 12.5* 11.6* 12.7*  HCT 39.8   < > 36.5* 34.0* 37.2*  MCV 92.3  --  89.5  --  90.5  MCH 31.8  --  30.6  --  30.9  MCHC 34.4  --  34.2  --  34.1  RDW 12.3  --  12.1  --  12.3  PLT 137*  --  124*  --  157   < > = values in this interval not displayed.    BNP Recent Labs  Lab 04/23/19 0945  BNP 2,909.9*     DDimer No results for input(s): DDIMER in the last 168 hours.   Radiology    DG Chest Port 1 View  Result Date: 04/27/2019 CLINICAL DATA:  Intubated patient, update status EXAM: PORTABLE CHEST 1 VIEW COMPARISON:  04/26/2019 FINDINGS: Bilateral internal jugular central venous lines are unchanged in position, tip of right IJ catheter in right brachycephalic vein. Left internal jugular catheter in the proximal SVC. Endotracheal tube projecting between the clavicles unchanged. Gastric tube in the stomach, tip off the field of the radiograph. Cardiomediastinal contours remain markedly enlarged. Patchy bilateral airspace and interstitial changes are similar with perhaps slightly improved aeration at the left lung base when compared to the prior study. Findings of small right pleural effusion again suggested with thickening of the minor fissure in the right chest likely with effusion at the left base as well. Visualized skeletal structures are unremarkable. IMPRESSION: Perhaps slightly improved aeration at the left lung base. Otherwise no change in the bilateral patchy airspace and interstitial changes, likely associated with small effusions. Lines and tubes unchanged.  Electronically Signed   By: Zetta Bills M.D.   On: 04/27/2019  08:03   DG Chest Port 1 View  Result Date: 04/26/2019 CLINICAL DATA:  Acute respiratory failure, ET tube, post stroke. EXAM: PORTABLE CHEST 1 VIEW COMPARISON:  04/25/2019 9:32 a.m. FINDINGS: Endotracheal tube projects approximately 4.5 cm above the carina similar position between the clavicular heads as compared to most recent study. Left-sided central venous catheter terminates in proximal SVC. Right IJ catheter dual lumen terminating in the right brachiocephalic vein. Tip just at the brachiocephalic junction. Cardiomediastinal contours remain enlarged and are largely obscured particularly in the left chest. Interstitial prominence/congestive changes. Similar to prior study. Visualized skeletal structures are unremarkable. IMPRESSION: 1. Lines and tubes as described above. 2. Cardiomegaly with basilar airspace disease, likely associated with effusions. 3. Interstitial and alveolar opacities elsewhere may represent a combination of infection and edema and are little changed. Electronically Signed   By: Zetta Bills M.D.   On: 04/26/2019 08:54   DG CHEST PORT 1 VIEW  Result Date: 04/25/2019 CLINICAL DATA:  Respiratory failure, COVID-19 positivity EXAM: PORTABLE CHEST 1 VIEW COMPARISON:  04/23/2019 FINDINGS: Endotracheal tube, gastric catheter and bilateral jugular central lines are again noted and stable. Cardiac shadow remains enlarged but accentuated by the portable technique. Previously seen airspace opacities have improved significantly with some mild persistent basilar changes. Small effusions are seen. No bony abnormality is noted. IMPRESSION: Overall improved aeration from the prior exam although persistent opacities and small effusions are noted. Electronically Signed   By: Inez Catalina M.D.   On: 04/25/2019 09:46   ECHOCARDIOGRAM LIMITED  Result Date: 04/25/2019    ECHOCARDIOGRAM LIMITED REPORT   Patient Name:   Carlos Brewer. Date of Exam: 04/25/2019 Medical Rec #:  [id number]                 Height:       73.0 in Accession #:    [id number]                Weight:       282.2 lb Date of Birth:  01-29-1986                 BSA:          2.49 m Patient Age:    33 years                  BP:           106/76 mmHg Patient Gender: M                         HR:           70 bpm. Exam Location:  Inpatient Procedure: Limited Echo and Intracardiac Opacification Agent Indications:    Stoke I163.9; Apex not seen well on initial echo, evauluation                 for LV thrombus.  History:        Patient has prior history of Echocardiogram examinations, most                 recent 05/10/2019. Non-ischemic Cardiomyopathy and CHF; Risk                 Factors:Cocaine Abuse and Current Smoker.  Sonographer:    Dustin Flock Referring Phys: [place]  1. Left ventricular ejection fraction, by estimation, is <20%. The left ventricle has severely decreased function. The left ventricle demonstrates global hypokinesis. The left  ventricular internal cavity size was severely dilated. Possible small 0.8 x 0.6 cm apical thrombus. Somewhat subtle finding, could confirm with cardiac MRI. However, if patient has had CVA, would be reasonable to anticoagulate given severity of LV dysfunction if no contraindications to anticoagulation.  2. Limited echo. FINDINGS  Left Ventricle: Left ventricular ejection fraction, by estimation, is <20%. The left ventricle has severely decreased function. The left ventricle demonstrates global hypokinesis. The left ventricular internal cavity size was severely dilated. There is no left ventricular hypertrophy. Loralie Champagne MD Electronically signed by Loralie Champagne MD Signature Date/Time: 04/25/2019/5:35:07 PM    Final     Cardiac Studies   ECHO Limited: 04/25/2019 1. Left ventricular ejection fraction, by estimation, is <20%. The left  ventricle has severely decreased function. The left ventricle  demonstrates  global hypokinesis. The left ventricular internal cavity size was severely  dilated. Possible small 0.8 x 0.6 cm apical thrombus.  Somewhat subtle finding, could confirm with cardiac  MRI. However, if patient has had CVA, would be reasonable to  anticoagulate given severity of LV dysfunction if no contraindications to  anticoagulation.  2. Limited echo.   ECHO: 04/30/2019 1. Left ventricular ejection fraction, by visual estimation, is <20%. The  left ventricle has severely decreased function. There is no left  ventricular hypertrophy.  2. Apex is not well-visualized. In setting of stroke with severe LV  systolic dysfunction, recommend repeat TTE with contrast to rule out LV  apical thrombus  3. Severely dilated left ventricular internal cavity size.  4. Left ventricular diastolic parameters are consistent with Grade III  diastolic dysfunction (restrictive).  5. Elevated left atrial pressure.  6. The left ventricle demonstrates global hypokinesis.  7. Global right ventricle has normal systolic function.The right  ventricular size is mildly enlarged.  8. Left atrial size was moderately dilated.  9. Right atrial size was normal.  10. The mitral valve is normal in structure. Mild mitral valve  regurgitation.  11. The tricuspid valve is normal in structure. Tricuspid valve  regurgitation is trivial.  12. The aortic valve is normal in structure. Aortic valve regurgitation is  not visualized. No evidence of aortic valve sclerosis or stenosis.  13. The pulmonic valve was not well visualized. Pulmonic valve  regurgitation is not visualized.    Patient Profile     34 y.o. male with history of NICM and myocarditis, followed by the advanced CHF service, presented 02/05 with acute MCA stroke treated with tPA and IR thrombectomy, now hypotensive, concerning for possible septic versus cardiogenic shock, SVT , VT.  Assessment & Plan    1.  Shock most likely septic    - after brief NSVT, pt in WCT, amio started, BP dropped - Levo weaning and vasopressin continues - believed to be septic shock - Coox today 98 --04/27/19  2. ARF - BUN/Cr 17/1.17 on admit - peak BUN/Cr 29/5.17 now 44/3.10  - anuric - Nephrology managing but after NSVT, they are keeping him fluid even, CRRT stable  - CVP 23 yesterday none today   3. Shock liver - LFTs initially ok, then AST & ALT > 10,000 - CK 602 - Improving. But still elevated  4. CVA acute  - IR did TPA and thrombectomy of R-MCA - apical thrombus seen on limited echo w/ Definity - s/p TPA and thrombectomy by IR - on heparin  5. Elevated troponin, hx CHF - EF < 20% is unchanged - trop >16,000 - hx NICM - no further workup at this  time  6. Tachycardia - brief NSVT, then WCT, believed to be either atrial tach or junctional today some obvious ST Does have PVCs last bigeminy last pm  - on amio - because of pressors, no BB/CCB - no need for DCCV - Keep K+ > 4.0, Mg > 2.0 - K+ 4.8 today, Mg 2.7      For questions or updates, please contact [facility] Please consult [email or web address] for contact info under        Signed, Cecilie Kicks, NP  04/27/2019, 9:02 AM

## 2019-04-27 NOTE — Progress Notes (Signed)
Patient ID: Carlos Brewer., male   DOB: 1986-03-05, 34 y.o.   MRN: [id number] [facility] Progress Note   Assessment/ Plan:   1. Acute kidney Injury: Anuric.  Consistent with ATN with ischemic/nephrotoxic etiologies and exacerbated by cardiogenic shock.  He does not have any evidence of renal recovery and I will continue him on CRRT with resumption of efforts at trying to volume unloading with ultrafiltration of net -50 cc an hour. He remains on Levophed with efforts to try and wean this. 2.  Shock: Cardiogenic versus septic with data so far favoring the latter (pulmonary versus urinary etiology).  On meropenem and vancomycin.  Remains on Levophed with attempt to wean.  LFTs trending down. 3.  Acute CVA from right MCA occlusion: Status post TPA/thrombectomy by IR.  Likely with significant residual neurological injury. 4.  Acute hypoxic respiratory failure: Remains ventilator dependent with efforts at trying to volume unloading with CRRT.  Subjective:   Without acute events noted overnight, continues to tolerate CRRT without problem.   Objective:   BP (!) 126/100   Pulse 85   Temp 99 F (37.2 C)   Resp (!) 21   Ht 6' 1"  (1.854 m)   Wt 124.7 kg   SpO2 100%   BMI 36.27 kg/m   Intake/Output Summary (Last 24 hours) at 04/27/2019 0835 Last data filed at 04/27/2019 0800 Gross per 24 hour  Intake 3526.31 ml  Output 3532 ml  Net -5.69 ml   Weight change: -0.5 kg  Physical Exam: Gen: Intubated, sedated CVS: Pulse regular rhythm, normal rate, S1 and S2 normal Resp: Anteriorly transmitted breath sounds on ventilator. Abd: Soft, obese, nontender Ext: Trace lower extremity edema, 1+ presacral edema  Imaging: DG Chest Port 1 View  Result Date: 04/27/2019 CLINICAL DATA:  Intubated patient, update status EXAM: PORTABLE CHEST 1 VIEW COMPARISON:  04/26/2019 FINDINGS: Bilateral internal jugular central venous lines are unchanged in position, tip of right IJ catheter in  right brachycephalic vein. Left internal jugular catheter in the proximal SVC. Endotracheal tube projecting between the clavicles unchanged. Gastric tube in the stomach, tip off the field of the radiograph. Cardiomediastinal contours remain markedly enlarged. Patchy bilateral airspace and interstitial changes are similar with perhaps slightly improved aeration at the left lung base when compared to the prior study. Findings of small right pleural effusion again suggested with thickening of the minor fissure in the right chest likely with effusion at the left base as well. Visualized skeletal structures are unremarkable. IMPRESSION: Perhaps slightly improved aeration at the left lung base. Otherwise no change in the bilateral patchy airspace and interstitial changes, likely associated with small effusions. Lines and tubes unchanged. Electronically Signed   By: Zetta Bills M.D.   On: 04/27/2019 08:03   DG Chest Port 1 View  Result Date: 04/26/2019 CLINICAL DATA:  Acute respiratory failure, ET tube, post stroke. EXAM: PORTABLE CHEST 1 VIEW COMPARISON:  04/25/2019 9:32 a.m. FINDINGS: Endotracheal tube projects approximately 4.5 cm above the carina similar position between the clavicular heads as compared to most recent study. Left-sided central venous catheter terminates in proximal SVC. Right IJ catheter dual lumen terminating in the right brachiocephalic vein. Tip just at the brachiocephalic junction. Cardiomediastinal contours remain enlarged and are largely obscured particularly in the left chest. Interstitial prominence/congestive changes. Similar to prior study. Visualized skeletal structures are unremarkable. IMPRESSION: 1. Lines and tubes as described above. 2. Cardiomegaly with basilar airspace disease, likely associated with effusions. 3. Interstitial and alveolar opacities  elsewhere may represent a combination of infection and edema and are little changed. Electronically Signed   By: Zetta Bills M.D.    On: 04/26/2019 08:54   DG CHEST PORT 1 VIEW  Result Date: 04/25/2019 CLINICAL DATA:  Respiratory failure, COVID-19 positivity EXAM: PORTABLE CHEST 1 VIEW COMPARISON:  04/23/2019 FINDINGS: Endotracheal tube, gastric catheter and bilateral jugular central lines are again noted and stable. Cardiac shadow remains enlarged but accentuated by the portable technique. Previously seen airspace opacities have improved significantly with some mild persistent basilar changes. Small effusions are seen. No bony abnormality is noted. IMPRESSION: Overall improved aeration from the prior exam although persistent opacities and small effusions are noted. Electronically Signed   By: Inez Catalina M.D.   On: 04/25/2019 09:46   ECHOCARDIOGRAM LIMITED  Result Date: 04/25/2019    ECHOCARDIOGRAM LIMITED REPORT   Patient Name:   Carlos Brewer. Date of Exam: 04/25/2019 Medical Rec #:  [id number]                 Height:       73.0 in Accession #:    [id number]                Weight:       282.2 lb Date of Birth:  03-22-1985                 BSA:          2.49 m Patient Age:    34 years                  BP:           106/76 mmHg Patient Gender: M                         HR:           70 bpm. Exam Location:  Inpatient Procedure: Limited Echo and Intracardiac Opacification Agent Indications:    Stoke I163.9; Apex not seen well on initial echo, evauluation                 for LV thrombus.  History:        Patient has prior history of Echocardiogram examinations, most                 recent 05/12/2019. Non-ischemic Cardiomyopathy and CHF; Risk                 Factors:Cocaine Abuse and Current Smoker.  Sonographer:    Dustin Flock Referring Phys: [place]  1. Left ventricular ejection fraction, by estimation, is <20%. The left ventricle has severely decreased function. The left ventricle demonstrates global hypokinesis. The left ventricular internal cavity size was severely dilated. Possible small 0.8 x 0.6  cm apical thrombus. Somewhat subtle finding, could confirm with cardiac MRI. However, if patient has had CVA, would be reasonable to anticoagulate given severity of LV dysfunction if no contraindications to anticoagulation.  2. Limited echo. FINDINGS  Left Ventricle: Left ventricular ejection fraction, by estimation, is <20%. The left ventricle has severely decreased function. The left ventricle demonstrates global hypokinesis. The left ventricular internal cavity size was severely dilated. There is no left ventricular hypertrophy. Loralie Champagne MD Electronically signed by Loralie Champagne MD Signature Date/Time: 04/25/2019/5:35:07 PM    Final     Labs: BMET Recent Labs  Lab 04/24/19 2841 04/24/19 3244 04/24/19 0102 04/24/19 7253 04/24/19 1708 04/24/19  2023 04/25/19 0358 04/25/19 1008 04/25/19 1600 04/25/19 2319 04/26/19 0158 04/26/19 1710 04/27/19 0427  NA 138   < > 139   < > 139  --  138  --  138 135 135 136 136  K 5.7*   < > 5.3*   < > 4.3   < > 4.2 3.9 4.3 3.8 3.9 4.7 4.8  CL 98   < > 98  --  98  --  93*  --  93*  --  96* 99 101  CO2 20*   < > 27  --  26  --  26  --  28  --  25 23 21*  GLUCOSE 276*   < > 206*  --  159*  --  171*  --  171*  --  212* 231* 217*  BUN 29*   < > 28*  --  28*  --  31*  --  35*  --  42* 42* 44*  CREATININE 5.17*   < > 4.77*  --  4.32*  --  4.09*  --  3.91*  --  3.64* 3.18* 3.10*  CALCIUM 7.1*   < > 7.1*  --  7.3*  --  7.7*  --  7.6*  --  7.7* 8.2* 8.5*  PHOS 4.9*  --   --   --  4.5  --  4.0  --  4.9*  --  4.1 4.0 4.1   < > = values in this interval not displayed.   CBC Recent Labs  Lab 04/30/2019 0355 05/15/2019 0430 04/23/19 0208 04/23/19 0208 04/23/19 0945 04/23/19 1022 04/24/19 0843 04/24/19 0911 04/24/19 1611 04/25/19 0358 04/25/19 2319 04/26/19 0158  WBC 7.2   < > 21.9*   < > 16.2*  --  17.6*  --   --  15.6*  --  20.4*  NEUTROABS 4.3  --  19.2*  --  13.5*  --  15.8*  --   --   --   --   --   HGB 14.9   < > 15.4   < > 14.6   < > 13.7   < >  12.6* 12.5* 11.6* 12.7*  HCT 42.8   < > 45.2   < > 44.0   < > 39.8   < > 37.0* 36.5* 34.0* 37.2*  MCV 89.0   < > 90.8   < > 92.6  --  92.3  --   --  89.5  --  90.5  PLT 243   < > 171   < > 145*  --  137*  --   --  124*  --  157   < > = values in this interval not displayed.    Medications:    . sodium chloride   Intravenous Once  . chlorhexidine gluconate (MEDLINE KIT)  15 mL Mouth Rinse BID  . Chlorhexidine Gluconate Cloth  6 each Topical Q0600  . feeding supplement (PRO-STAT SUGAR FREE 64)  60 mL Per Tube TID  . fentaNYL (SUBLIMAZE) injection  50 mcg Intravenous Once  . hydrocortisone sod succinate (SOLU-CORTEF) inj  50 mg Intravenous Q12H  . insulin aspart  0-20 Units Subcutaneous Q4H  . insulin detemir  10 Units Subcutaneous Daily  . mouth rinse  15 mL Mouth Rinse 10 times per day  . mupirocin ointment  1 application Nasal BID  . pantoprazole sodium  40 mg Per Tube Q1200  . sodium chloride flush  10-40 mL Intracatheter Q12H  Elmarie Shiley, MD 04/27/2019, 8:35 AM

## 2019-04-27 NOTE — Progress Notes (Addendum)
Inpatient Diabetes Program Recommendations  AACE/ADA: New Consensus Statement on Inpatient Glycemic Control (2015)  Target Ranges:  Prepandial:   less than 140 mg/dL      Peak postprandial:   less than 180 mg/dL (1-2 hours)      Critically ill patients:  140 - 180 mg/dL   Lab Results  Component Value Date   GLUCAP 206 (H) 04/27/2019   HGBA1C 6.5 (H) 04/23/2019    Review of Glycemic Control  Diabetes history: DM 2 Outpatient Diabetes medications: Levemir 40 units qhs, Novolog 10 units tid  Current orders for Inpatient glycemic control:  Levemir 10 units Daily Novolog 0-20 units Q4 hours  A1c 6.5% on 2/6 Solucortef 50 mg Q12 hours Vital HP 50 ml/hour  Inpatient Diabetes Program Recommendations:    Consider Novolog 4 units Q4 hours Tube Feed Coverage  Thanks,  Tama Headings RN, MSN, BC-ADM Inpatient Diabetes Coordinator Team Pager [phone] (8a-5p)

## 2019-04-27 NOTE — Progress Notes (Signed)
NAME:  Carlos Brewer., MRN:  [id number], DOB:  [birth year], LOS: 5 ADMISSION DATE:  04/23/2019, CONSULTATION DATE:  05/11/2019 REFERRING MD:  Dr. Leonel Brewer, CHIEF COMPLAINT:  Slurred speech  Brief History   34 yo male smoker found to have slurred speech and Rt sided weakness.  CT head showed M2 occlusion.  Ultimately treated with thrombolytic and thrombectomy by IR.  Required intubation for airway protection.    Past Medical History  Systolic CHF with non ischemic CM, Cocaine abuse, OSA, DM Hx of CHF (EF 15%)  Hx myocarditis Smoker 1/2 ppd   Significant Hospital Events   2/05 Admit, tPA, IR thrombectomy 2/6 100% on fvent at 2300  Overnight requiring increasing levophed and phenylephrine.    2/6: switched to levophed, epi, started antibiotics, started on CRRT.  2/9  developed wide-complex tachycardia and hypotension, started on amiodarone drip, increase Levophed drip  Consults:  Neuro IR  Procedures:  ETT 2/05 >>  LIJ CVL 2/5 >> RIJ HD cath 2/6 >>  2/5-2/6:  TPA given at 428 am (total of 90 Mg) 520 am went to IR  S/P Lt common carotid arteriogram followed by complete revascularization of occluded LT MCA sup division mid M2 seg with x 1 pass with 52mx 40 mm solitaire X ret river device and penumbra aspiration with TICI 3 revascularization   Significant Diagnostic Tests:  CT angio head/neck 2/05 >> occlusion of Lt MCA bifurcation Echo 2/05 >> EF less than 20%, cannot rule out apical thrombus Limited echo 2/8 >> Possible small 0.8 x 0.6 cm apical thrombus  Micro Data:  SARS CoV2 PCR 2/05 >> negative Influenza PCR 2/05 >> negative Urine 2/6 >> ng resp 2/6 >> nml flora BC 2/6 >>ng  Antimicrobials:  mero 2/7 >> vanc 2/6 >> 2/9  Interim history/subjective:   Remains critically ill, intubated On CRRT On amnio drip, developed bigeminy and then reverted to sinus rhythm Anuric    Objective   Blood pressure 133/84, pulse 82, temperature 99 F (37.2 C),  resp. rate (!) 24, height 6' 1"  (1.854 m), weight 124.7 kg, SpO2 100 %.    Vent Mode: PCV FiO2 (%):  [40 %] 40 % Set Rate:  [24 bmp] 24 bmp PEEP:  [5 cmH20] 5 cmH20 Plateau Pressure:  [20 cmH20-24 cmH20] 23 cmH20   Intake/Output Summary (Last 24 hours) at 04/27/2019 [id number] data filed at 04/27/2019 0800 Gross per 24 hour  Intake 3388.97 ml  Output 3354 ml  Net 34.97 ml   Filed Weights   04/25/19 0500 04/26/19 0500 04/27/19 0424  Weight: 128 kg 125.2 kg 124.7 kg    Examination:  General -young obese well-built, no distress Eyes - upward gaze with roving eye movements ,no nystagmus ENT - ETT in place , mild pallor, no icterus, catheter exit sites no erythema Cardiac -S1-S2 distant, no murmur Chest - no wheeze, bilateral ventilated breath sounds Abdomen - soft, non tender, + bowel sounds Extremities - no cyanosis, clubbing, or edema Skin - no rashes Neuro -opens eyes to name, roving eye movements, does not follow commands, right hemiplegia, moves left arm and leg to painful stimulus   Chest x-ray 2/10 personally reviewed shows lines and tubes in position, much improved aeration compared to prior   Labs show normal electrolytes, elevated sugars, decreasing LFTs Resolved Hospital Problem list     Assessment & Plan:  Shock, septic + cardiogenic:   Increased pressor requirements 2/9 due to arrhythmia/amiodarone -now decreasing Levophed again Possibly UTI vs PNA.  Continue meropenem, negative cultures Ct  hydrocortisone  50 q12 while on pressors  #Cardiogenic shock -excellent coox reassuring LV apical thrombus Non ischemic CM with chronic systolic CHF. 2/9 wide-complex tachycardia Dx 2018 - followed by [facility] in [place] - continue lipitor, digoxin - hold outpt coreg, zaroxolyn, entresto, aldactone, torsemide for now Cardiology following, continue IV amiodarone -IV heparin for apical thrombus   Acute hypoxemic respiratory failure: B infiltrates, ARDS vs  cardiogenic pulm edema. Oxygenation has improved volume removal with CRRT.  LTV ventilation as tolerates.  --Can start spontaneous breathing trials but will not extubate due to mental status  AKI;  Continue CRRT per renal, keep equal balance No signs of renal recovery  L MCA CVA: Likely embolic, s/p tpa 218 AM on 2/5 ,S/p IR clot removal.  Acute metabolic encephalopathy:  Likely metabolic (AKI, shock, transaminitis), combined with residual fentanyl-getting more concerned about anoxic damage,   -Using intermittent fentanyl, goal RASS 0 -May need repeat imaging at some point -Check ammonia in a.m.   Transaminitis 2/2 hypoperfusion (shock liver) Coagulopathy - INR improved  (FFP x 2  2/6) , LFTs slight bump 2/9 due to increased hypotension Appreciate GI consult.  Caution with acetaminophen if needed for fever.  Follow LFTs daily, now decreasing   Hx of cocaine abuse. depression. - not active per UDS  - continue prozac   DM type II poorly controlled. - SSI -Increase to 20 units Levemir daily -Add 4 units NovoLog every 4 hours to feed coverage -CBG 1 40-180 acceptable  Summary-shock improving again, main concern here is his poor mental status.  Not sure if we are to expect renal recovery  Best practice:  Diet: NPO, start TFs DVT prophylaxis: SCDs GI prophylaxis: protonix Mobility: bed rest Code Status: full code Disposition: ICU Family -fianc Carlos Brewer updated 2/10.Plan for aggressive care, esp given his young age.  He has many supportive family and also has young children.    The patient is critically ill with multiple organ systems failure and requires high complexity decision making for assessment and support, frequent evaluation and titration of therapies, application of advanced monitoring technologies and extensive interpretation of multiple databases. Critical Care Time devoted to patient care services described in this note independent of APP/resident  time is 35  minutes.   Care was coordinated with other consultants including cardiology and neurology   Carlos Mead MD. FCCP. [facility]  If no response to pager , please call [phone]   04/27/2019    04/27/2019

## 2019-04-27 NOTE — Progress Notes (Signed)
The chaplain visited as a result of rounding. The chaplain offered spiritual support and prayer for the patient and family. The chaplain will follow-up with this patient later.  Brion Aliment Chaplain Resident For questions concerning this note please contact me by pager [phone]

## 2019-04-27 NOTE — Progress Notes (Signed)
ANTICOAGULATION CONSULT NOTE  Pharmacy Consult for heparin Indication: stroke, LV thrombus   No Known Allergies  Patient Measurements: Height: 6\' 1"  (185.4 cm) Weight: 274 lb 14.6 oz (124.7 kg) IBW/kg (Calculated) : 79.9 Heparin Dosing Weight: 107 kg  Vital Signs: Temp: 98.4 F (36.9 C) (02/10 1700) BP: 118/80 (02/10 1700) Pulse Rate: 84 (02/10 1700)  Labs: Recent Labs    04/25/19 0358 04/25/19 1600 04/25/19 2319 04/26/19 0155 04/26/19 0158 04/26/19 0927 04/26/19 1710 04/27/19 0047 04/27/19 0427 04/27/19 0817 04/27/19 1608 04/27/19 1704  HGB 12.5*  --  11.6*  --  12.7*  --   --   --   --   --   --   --   HCT 36.5*  --  34.0*  --  37.2*  --   --   --   --   --   --   --   PLT 124*  --   --   --  157  --   --   --   --   --   --   --   LABPROT  --   --   --   --  14.7  --   --   --   --   --   --   --   INR  --   --   --   --  1.2  --   --   --   --   --   --   --   HEPARINUNFRC  --   --   --    < >  --    < >  --    < >  --  0.44 0.81* 0.88*  CREATININE 4.09*   < >  --   --  3.64*  --  3.18*  --  3.10*  --   --   --   TROPONINIHS  --   --   --   --  2,409*  --   --   --   --   --   --   --    < > = values in this interval not displayed.    Estimated Creatinine Clearance: 46.4 mL/min (A) (by C-G formula based on SCr of 3.1 mg/dL (H)).  Assessment: 34 yo m initially presented with aphasia and right sided weaknesss. Found to have left MCA infarct - received TPA and revascularization with IR on 2/5. Now found to have LV apical thrombus on ECHO.  Pharmacy consulted to dose IV heparin.  Heparin level is now supra-therapeutic (0.81, confirmed 0.88).  Appears to be accumulating.  No issue with lab draw and no bleeding reported.  Goal of Therapy:  Heparin level 0.3 - 0.5 units/ml Monitor platelets by anticoagulation protocol: Yes   Plan:  Reduce heparin infusion to 1800 units/hr Check 6 hr heparin level  Daviona Herbert D. Mina Marble, PharmD, BCPS, [place] 04/27/2019, 5:46 PM

## 2019-04-27 NOTE — Progress Notes (Addendum)
Assisted tele visit to patient with spouse.  Maryelizabeth Rowan, RN

## 2019-04-28 ENCOUNTER — Inpatient Hospital Stay (HOSPITAL_COMMUNITY): Payer: No Typology Code available for payment source

## 2019-04-28 DIAGNOSIS — I472 Ventricular tachycardia: Secondary | ICD-10-CM | POA: Diagnosis not present

## 2019-04-28 DIAGNOSIS — N179 Acute kidney failure, unspecified: Secondary | ICD-10-CM | POA: Diagnosis not present

## 2019-04-28 DIAGNOSIS — I639 Cerebral infarction, unspecified: Secondary | ICD-10-CM | POA: Diagnosis not present

## 2019-04-28 DIAGNOSIS — I6602 Occlusion and stenosis of left middle cerebral artery: Secondary | ICD-10-CM | POA: Diagnosis not present

## 2019-04-28 DIAGNOSIS — I5022 Chronic systolic (congestive) heart failure: Secondary | ICD-10-CM | POA: Diagnosis not present

## 2019-04-28 DIAGNOSIS — A419 Sepsis, unspecified organism: Secondary | ICD-10-CM | POA: Diagnosis not present

## 2019-04-28 DIAGNOSIS — Z4659 Encounter for fitting and adjustment of other gastrointestinal appliance and device: Secondary | ICD-10-CM

## 2019-04-28 DIAGNOSIS — I471 Supraventricular tachycardia: Secondary | ICD-10-CM | POA: Diagnosis not present

## 2019-04-28 DIAGNOSIS — I63312 Cerebral infarction due to thrombosis of left middle cerebral artery: Secondary | ICD-10-CM | POA: Diagnosis not present

## 2019-04-28 DIAGNOSIS — I5021 Acute systolic (congestive) heart failure: Secondary | ICD-10-CM

## 2019-04-28 DIAGNOSIS — E785 Hyperlipidemia, unspecified: Secondary | ICD-10-CM | POA: Diagnosis not present

## 2019-04-28 DIAGNOSIS — R6521 Severe sepsis with septic shock: Secondary | ICD-10-CM | POA: Diagnosis not present

## 2019-04-28 LAB — RENAL FUNCTION PANEL
Albumin: 2.8 g/dL — ABNORMAL LOW (ref 3.5–5.0)
Albumin: 2.7 g/dL — ABNORMAL LOW (ref 3.5–5.0)
Anion gap: 16 — ABNORMAL HIGH (ref 5–15)
Anion gap: 11 (ref 5–15)
BUN: 54 mg/dL — ABNORMAL HIGH (ref 6–20)
BUN: 56 mg/dL — ABNORMAL HIGH (ref 6–20)
CO2: 24 mmol/L (ref 22–32)
CO2: 24 mmol/L (ref 22–32)
Calcium: 8.7 mg/dL — ABNORMAL LOW (ref 8.9–10.3)
Calcium: 8.4 mg/dL — ABNORMAL LOW (ref 8.9–10.3)
Chloride: 97 mmol/L — ABNORMAL LOW (ref 98–111)
Chloride: 100 mmol/L (ref 98–111)
Creatinine, Ser: 3.11 mg/dL — ABNORMAL HIGH (ref 0.61–1.24)
Creatinine, Ser: 3.07 mg/dL — ABNORMAL HIGH (ref 0.61–1.24)
GFR calc Af Amer: 29 mL/min — ABNORMAL LOW (ref >60)
GFR calc Af Amer: 29 mL/min — ABNORMAL LOW (ref >60)
GFR calc non Af Amer: 25 mL/min — ABNORMAL LOW (ref >60)
GFR calc non Af Amer: 25 mL/min — ABNORMAL LOW (ref >60)
Glucose, Bld: 138 mg/dL — ABNORMAL HIGH (ref 70–99)
Glucose, Bld: 177 mg/dL — ABNORMAL HIGH (ref 70–99)
Phosphorus: 3.3 mg/dL (ref 2.5–4.6)
Phosphorus: 4.2 mg/dL (ref 2.5–4.6)
Potassium: 4 mmol/L (ref 3.5–5.1)
Potassium: 4 mmol/L (ref 3.5–5.1)
Sodium: 137 mmol/L (ref 135–145)
Sodium: 135 mmol/L (ref 135–145)

## 2019-04-28 LAB — CBC
HCT: 39.5 % (ref 39.0–52.0)
Hemoglobin: 12.8 g/dL — ABNORMAL LOW (ref 13.0–17.0)
MCH: 30.8 pg (ref 26.0–34.0)
MCHC: 32.4 g/dL (ref 30.0–36.0)
MCV: 95 fL (ref 80.0–100.0)
Platelets: 164 10*3/uL (ref 150–400)
RBC: 4.16 MIL/uL — ABNORMAL LOW (ref 4.22–5.81)
RDW: 13.4 % (ref 11.5–15.5)
WBC: 22.3 10*3/uL — ABNORMAL HIGH (ref 4.0–10.5)
nRBC: 1.4 % — ABNORMAL HIGH (ref 0.0–0.2)

## 2019-04-28 LAB — GLUCOSE, CAPILLARY
Glucose-Capillary: 157 mg/dL — ABNORMAL HIGH (ref 70–99)
Glucose-Capillary: 169 mg/dL — ABNORMAL HIGH (ref 70–99)
Glucose-Capillary: 193 mg/dL — ABNORMAL HIGH (ref 70–99)
Glucose-Capillary: 163 mg/dL — ABNORMAL HIGH (ref 70–99)
Glucose-Capillary: 145 mg/dL — ABNORMAL HIGH (ref 70–99)
Glucose-Capillary: 161 mg/dL — ABNORMAL HIGH (ref 70–99)

## 2019-04-28 LAB — HEPARIN LEVEL (UNFRACTIONATED)
Heparin Unfractionated: 0.8 IU/mL — ABNORMAL HIGH (ref 0.30–0.70)
Heparin Unfractionated: 0.72 IU/mL — ABNORMAL HIGH (ref 0.30–0.70)
Heparin Unfractionated: 0.73 IU/mL — ABNORMAL HIGH (ref 0.30–0.70)
Heparin Unfractionated: 0.55 IU/mL (ref 0.30–0.70)

## 2019-04-28 LAB — CULTURE, BLOOD (ROUTINE X 2)
Culture: NO GROWTH
Culture: NO GROWTH

## 2019-04-28 LAB — HEPATIC FUNCTION PANEL
ALT: 1909 U/L — ABNORMAL HIGH (ref 0–44)
AST: 402 U/L — ABNORMAL HIGH (ref 15–41)
Albumin: 2.8 g/dL — ABNORMAL LOW (ref 3.5–5.0)
Alkaline Phosphatase: 166 U/L — ABNORMAL HIGH (ref 38–126)
Bilirubin, Direct: 1 mg/dL — ABNORMAL HIGH (ref 0.0–0.2)
Indirect Bilirubin: 1.4 mg/dL — ABNORMAL HIGH (ref 0.3–0.9)
Total Bilirubin: 2.4 mg/dL — ABNORMAL HIGH (ref 0.3–1.2)
Total Protein: 6.3 g/dL — ABNORMAL LOW (ref 6.5–8.1)

## 2019-04-28 LAB — MAGNESIUM: Magnesium: 2.9 mg/dL — ABNORMAL HIGH (ref 1.7–2.4)

## 2019-04-28 LAB — COOXEMETRY PANEL
Carboxyhemoglobin: 1.7 % — ABNORMAL HIGH (ref 0.5–1.5)
Methemoglobin: 1 % (ref 0.0–1.5)
O2 Saturation: 99.1 %
Total hemoglobin: 12.9 g/dL (ref 12.0–16.0)

## 2019-04-28 MED ORDER — NOREPINEPHRINE 4 MG/250ML-% IV SOLN: 0.0000 ug/min | INTRAVENOUS | Status: DC

## 2019-04-28 MED ORDER — DEXMEDETOMIDINE HCL IN NACL 400 MCG/100ML IV SOLN
0.4000 ug/kg/h | INTRAVENOUS | Status: DC
  Administered 2019-04-28: 0.4 ug/kg/h via INTRAVENOUS
  Administered 2019-04-29 (×2): 0.6 ug/kg/h via INTRAVENOUS
  Administered 2019-04-29: 0.3 ug/kg/h via INTRAVENOUS
  Administered 2019-04-30: 23:00:00 0.6 ug/kg/h via INTRAVENOUS
  Administered 2019-04-30: 0.4 ug/kg/h via INTRAVENOUS
  Administered 2019-04-30: 0.7 ug/kg/h via INTRAVENOUS
  Administered 2019-04-30: 0.4 ug/kg/h via INTRAVENOUS
  Administered 2019-05-01 (×4): 0.6 ug/kg/h via INTRAVENOUS
  Administered 2019-05-02: 0.4 ug/kg/h via INTRAVENOUS
  Administered 2019-05-02 (×2): 0.6 ug/kg/h via INTRAVENOUS
  Administered 2019-05-03: 22:00:00 0.7 ug/kg/h via INTRAVENOUS
  Administered 2019-05-03 – 2019-05-04 (×2): 0.5 ug/kg/h via INTRAVENOUS
  Administered 2019-05-04: 0.7 ug/kg/h via INTRAVENOUS
  Administered 2019-05-04 (×2): 0.6 ug/kg/h via INTRAVENOUS
  Administered 2019-05-05: 0.8 ug/kg/h via INTRAVENOUS
  Administered 2019-05-05: 23:00:00 1 ug/kg/h via INTRAVENOUS
  Administered 2019-05-05 (×5): 1.2 ug/kg/h via INTRAVENOUS
  Administered 2019-05-06: 0.7 ug/kg/h via INTRAVENOUS
  Administered 2019-05-06: 18:00:00 0.5 ug/kg/h via INTRAVENOUS
  Administered 2019-05-06: 1 ug/kg/h via INTRAVENOUS
  Administered 2019-05-06: 0.5 ug/kg/h via INTRAVENOUS
  Administered 2019-05-06: 1 ug/kg/h via INTRAVENOUS
  Administered 2019-05-07: 0.5 ug/kg/h via INTRAVENOUS
  Filled 2019-04-28 (×3): qty 100
  Filled 2019-04-28: qty 300
  Filled 2019-04-28: qty 100
  Filled 2019-04-28: qty 200
  Filled 2019-04-28 (×2): qty 100
  Filled 2019-04-28: qty 200
  Filled 2019-04-28 (×4): qty 100
  Filled 2019-04-28: qty 200
  Filled 2019-04-28 (×9): qty 100
  Filled 2019-04-28: qty 200
  Filled 2019-04-28 (×6): qty 100

## 2019-04-28 MED ORDER — ONDANSETRON HCL 4 MG/2ML IJ SOLN: 4.0000 mg | Freq: Four times a day (QID) | INTRAMUSCULAR | Status: DC | PRN

## 2019-04-28 MED ORDER — NOREPINEPHRINE 16 MG/250ML-% IV SOLN
0.0000 ug/min | INTRAVENOUS | Status: DC
  Administered 2019-04-28: 4 ug/min via INTRAVENOUS
  Administered 2019-04-29: 24 ug/min via INTRAVENOUS
  Administered 2019-04-30: 18 ug/min via INTRAVENOUS
  Administered 2019-04-30: 02:00:00 21 ug/min via INTRAVENOUS
  Administered 2019-05-01: 14 ug/min via INTRAVENOUS
  Administered 2019-05-01: 35 ug/min via INTRAVENOUS
  Administered 2019-05-04: 2 ug/min via INTRAVENOUS
  Administered 2019-05-05: 9 ug/min via INTRAVENOUS
  Administered 2019-05-06: 13:00:00 2 ug/min via INTRAVENOUS
  Filled 2019-04-28 (×8): qty 250

## 2019-04-28 NOTE — Progress Notes (Signed)
Nutrition Follow-up  DOCUMENTATION CODES:   Obesity unspecified  INTERVENTION:   Vital High Protein @ 50 ml/hr (1225ml/day) via OG tube 60 ml Prostat TID  Provides: 1800 kcal, 195 grams protein, and 1003 ml free water.    NUTRITION DIAGNOSIS:   Inadequate oral intake related to inability to eat as evidenced by NPO status. Ongoing.   GOAL:   Patient will meet greater than or equal to 90% of their needs Meeting with TF.   MONITOR:   Vent status  REASON FOR ASSESSMENT:   Ventilator    ASSESSMENT:   Pt with PMH of CHF, OSA, poorly controlled DM, cocaine abuse admitted with L MCA stroke s/p tPA and IR thrombectomy.   Pt with cardiogenic vs septic shock and AKI. Per Nephrology pt will continue CRRT as he has no evidence of renal recovery. Pt with negative balance of 50/hr  LFTs decreasing, pt now off pressors.   2/6 started on CRRT with increasing pressor requirements.    Patient is currently intubated on ventilator support MV: 10.4 L/min Temp (24hrs), Avg:98.2 F (36.8 C), Min:97.7 F (36.5 C), Max:98.4 F (36.9 C)  Medications reviewed and include:  4 units novolog every 4 hours, 20 units levemir daily,  Labs reviewed:  AST: 402 (H) ALT: 1909 (H)   I&O: -775 ml  14 F OG tube CRRT: 3824 ml   Diet Order:   Diet Order            Diet NPO time specified  Diet effective now              EDUCATION NEEDS:   No education needs have been identified at this time  Skin:  Skin Assessment: Reviewed RN Assessment  Last BM:  150 ml via rectal tube  Height:   Ht Readings from Last 1 Encounters:  05/01/2019 6\' 1"  (1.854 m)    Weight:   Wt Readings from Last 1 Encounters:  04/28/19 122.3 kg    Ideal Body Weight:  83.6 kg  BMI:  Body mass index is 35.57 kg/m.  Estimated Nutritional Needs:   Kcal:  1800-2100  Protein:  167 -209 grams  Fluid:  2 L/day  Lockie Pares., RD, LDN, CNSC See AMiON for contact information

## 2019-04-28 NOTE — Progress Notes (Signed)
[facility] for heparin Indication: stroke, LV thrombus   No Known Allergies  Patient Measurements: Height: 6\' 1"  (185.4 cm) Weight: 274 lb 14.6 oz (124.7 kg) IBW/kg (Calculated) : 79.9 Heparin Dosing Weight: 107 kg  Vital Signs: Temp: 98.2 F (36.8 C) (02/11 0200) Temp Source: Bladder (02/11 0000) BP: 124/86 (02/11 0200) Pulse Rate: 93 (02/11 0200)  Labs: Recent Labs    04/25/19 0358 04/25/19 1600 04/25/19 2319 04/26/19 0155 04/26/19 0158 04/26/19 0927 04/26/19 1710 04/27/19 0047 04/27/19 0427 04/27/19 0817 04/27/19 1608 04/27/19 1704 04/28/19 0204  HGB 12.5*  --  11.6*  --  12.7*  --   --   --   --   --   --   --   --   HCT 36.5*  --  34.0*  --  37.2*  --   --   --   --   --   --   --   --   PLT 124*  --   --   --  157  --   --   --   --   --   --   --   --   LABPROT  --   --   --   --  14.7  --   --   --   --   --   --   --   --   INR  --   --   --   --  1.2  --   --   --   --   --   --   --   --   HEPARINUNFRC  --   --   --    < >  --    < >  --    < >  --    < > 0.81* 0.88* 0.80*  CREATININE 4.09*   < >  --   --  3.64*  --  3.18*  --  3.10*  --  3.09*  --   --   TROPONINIHS  --   --   --   --  2,409*  --   --   --   --   --   --   --   --    < > = values in this interval not displayed.    Estimated Creatinine Clearance: 46.6 mL/min (A) (by C-G formula based on SCr of 3.09 mg/dL (H)).  Assessment: 34 y.o. male with CVA and LV thrombus for heparin  Goal of Therapy:  Heparin level 0.3 - 0.5 units/ml Monitor platelets by anticoagulation protocol: Yes   Plan:  Decrease Heparin 1500 units/hr Check heparin level in 8 hours.   Phillis Knack, PharmD, BCPS  04/28/2019, 2:56 AM

## 2019-04-28 NOTE — Progress Notes (Signed)
STROKE TEAM PROGRESS NOTE   INTERVAL HISTORY Pt still intubated on CRRT. Eyes closed, not open on voice today. As per RN, he did not spontaneous open eyes this am and then bite on the ET tube, had to give fentanyl for sedation. AST/ALT continue improve, and off pressor now. AKI stable but no improvement. Will do MRI once able.    OBJECTIVE Vitals:   04/28/19 0745 04/28/19 0800 04/28/19 0900 04/28/19 1000  BP: 132/83 (!) 132/109 113/78 120/85  Pulse: 78 82 76 78  Resp: (!) 21 18 19 20   Temp:  98.2 F (36.8 C) 98.2 F (36.8 C) 98.1 F (36.7 C)  TempSrc:      SpO2: 100% 100% 100% 100%  Weight:      Height:        CBC:  Recent Labs  Lab 04/23/19 0945 04/23/19 1022 04/24/19 0843 04/24/19 0911 04/26/19 0158 04/28/19 0549  WBC 16.2*   < > 17.6*   < > 20.4* 22.3*  NEUTROABS 13.5*  --  15.8*  --   --   --   HGB 14.6   < > 13.7   < > 12.7* 12.8*  HCT 44.0   < > 39.8   < > 37.2* 39.5  MCV 92.6   < > 92.3   < > 90.5 95.0  PLT 145*   < > 137*   < > 157 164   < > = values in this interval not displayed.    Basic Metabolic Panel:  Recent Labs  Lab 04/27/19 0427 04/27/19 0427 04/27/19 1608 04/28/19 0549  NA 136   < > 136 137  K 4.8   < > 4.3 4.0  CL 101   < > 100 97*  CO2 21*   < > 23 24  GLUCOSE 217*   < > 160* 138*  BUN 44*   < > 48* 54*  CREATININE 3.10*   < > 3.09* 3.11*  CALCIUM 8.5*   < > 8.4* 8.7*  MG 2.7*  --   --  2.9*  PHOS 4.1   < > 3.0 3.3   < > = values in this interval not displayed.   Lipid Panel:     Component Value Date/Time   CHOL 265 (H) 04/23/2019 0327   TRIG 191 (H) 04/23/2019 0327   HDL 24 (L) 04/23/2019 0327   CHOLHDL 11.0 04/23/2019 0327   VLDL 38 04/23/2019 0327   LDLCALC 203 (H) 04/23/2019 0327   HgbA1c:  Lab Results  Component Value Date   HGBA1C 6.5 (H) 04/23/2019   Urine Drug Screen:     Component Value Date/Time   LABOPIA NONE DETECTED 04/23/2019 0930   COCAINSCRNUR NONE DETECTED 04/23/2019 0930   COCAINSCRNUR NONE DETECTED  02/04/2018 1010   LABBENZ POSITIVE (A) 04/23/2019 0930   AMPHETMU NONE DETECTED 04/23/2019 0930   THCU NONE DETECTED 04/23/2019 0930   LABBARB NONE DETECTED 04/23/2019 0930    Alcohol Level     Component Value Date/Time   ETH <10 05/05/2019 0355    IMAGING CT HEAD CODE STROKE WO CONTRAST 04/20/2019 1. Asymmetric hyperdensity of a Left MCA branch in the Sylvian fissure suspicious for emergent large vessel occlusion in the setting.  2. No associated acute cortically based infarct, ASPECTS 10. No acute hemorrhage.  3. Chronic right frontal operculum encephalomalacia.   CT Code Stroke CTA Head W/WO contrast CT Code Stroke CTA Neck W/WO contrast 05/02/2019 1. Positive for emergent large vessel occlusion at the Left  MCA bifurcation. There is reconstitution of the dominant posterior division branch.  2. Elsewhere negative CTA head and neck. Dominant right vertebral artery which supplies the basilar. Fetal type PCA origins.   CT CEREBRAL PERFUSION W CONTRAST 05/07/2019 1. CTP detects evidence of Left MCA territory oligemia concordant with the CTA findings.  2. No infarct core detected with standard CBF <30%.  3. 60 mL of penumbra detected with standard T-max > 6s.   CT HEAD WO CONTRAST 04/23/2019 Small region of loss of gray-white differentiation in the left frontal operculum consistent with a fairly small area of acute infarction compared to the previous perfusion abnormality. No hemorrhage or mass effect. Areas of low-density in the cerebellum that I favor are artifactual. There did not seem to be any abnormalities in that region on the previous examinations.   MRI Head WO Contrast - pending  DG CHEST PORT 1 VIEW 04/26/2019 0854 1. Lines and tubes as described above. 2. Cardiomegaly with basilar airspace disease, likely associated with effusions. 3. Interstitial and alveolar opacities elsewhere may represent a combination of infection and edema and are little changed. 04/25/2019 0946 Overall  improved aeration from the prior exam although persistent opacities and small effusions are noted. 04/23/19 1636 1. Interval placement of a right neck vascular sheath, tip positioned over the right brachiocephalic vein. 2.  Other support apparatus in unchanged position. 3. Slight interval increase in diffuse bilateral interstitial and heterogeneous airspace opacity, consistent with multifocal infection, edema, and/or ARDS. 4.  Cardiomegaly. 04/23/2019 0757 Increasing patchy bilateral airspace opacities which may represent edema or infection. 05/15/2019 1958 1. Interval placement of neck vascular catheter, tip positioned near the superior cavoatrial junction.  2. Interval retraction of endotracheal tube, now positioned just above the thoracic inlet, approximately 6.5 cm above the carina.  3. Interval placement of esophagogastric tube, tip and side port below the diaphragm. 4. Stable cardiomegaly and diffuse bilateral interstitial airspace opacity, likely edema.  05/06/2019 0841 1. Endotracheal tube tip noted just above the carina. Proximal repositioning of 2-3 should be considered. 2. Cardiomegaly again noted. Diffuse bilateral pulmonary infiltrates/edema. Small left pleural effusion.   ECHOCARDIOGRAM COMPLETE 05/15/2019 1. Left ventricular ejection fraction, by visual estimation, is <20%. The left ventricle has severely decreased function. There is no left ventricular hypertrophy.   2. Apex is not well-visualized. In setting of stroke with severe LV systolic dysfunction, recommend repeat TTE with contrast to rule out LV apical thrombus   3. Severely dilated left ventricular internal cavity size.   4. Left ventricular diastolic parameters are consistent with Grade III diastolic dysfunction (restrictive).   5. Elevated left atrial pressure.   6. The left ventricle demonstrates global hypokinesis.   7. Global right ventricle has normal systolic function.The right ventricular size is mildly enlarged.   8. Left  atrial size was moderately dilated.   9. Right atrial size was normal.  10. The mitral valve is normal in structure. Mild mitral valve regurgitation.  11. The tricuspid valve is normal in structure. Tricuspid valve regurgitation is trivial.  12. The aortic valve is normal in structure. Aortic valve regurgitation is not visualized. No evidence of aortic valve sclerosis or stenosis.  13. The pulmonic valve was not well visualized. Pulmonic valve regurgitation is not visualized.   Interventional Neuro Radiology - Cerebral Angiogram with Intervention 04/26/2019 S/P Lt common carotid arteriogram followed by complete revascularization of occluded LT MCA sup division mid M2 seg with x 1 pass with 39mx 40 mm solitaire X ret river device and  penumbra aspiration with TICI 3 revascularization  ECG - SR rate 93 BPM. (See cardiology reading for complete details)  PHYSICAL EXAM    Temp:  [98.1 F (36.7 C)-99.5 F (37.5 C)] 98.1 F (36.7 C) (02/11 1000) Pulse Rate:  [76-103] 78 (02/11 1000) Resp:  [18-25] 20 (02/11 1000) BP: (107-140)/(72-109) 120/85 (02/11 1000) SpO2:  [100 %] 100 % (02/11 1000) Arterial Line BP: (112-150)/(63-95) 136/83 (02/11 1000) FiO2 (%):  [40 %] 40 % (02/11 0745) Weight:  [122.3 kg] 122.3 kg (02/11 0500)  General - Well nourished, well developed, intubated off sedation.  Ophthalmologic - fundi not visualized due to noncooperation.  Cardiovascular - Regular rhythm but tachycardia.  Neuro - intubated off sedation and off pressors but just had fentanyl bolus, eyes closed not following commands. Eyes slow horizontal rolling movement, but incomplete bilaterally, not blinking to visual threat bilaterally, PERRL. Corneal reflex present, gag and cough present. Breathing over the vent.  Facial symmetry not able to test due to ET tube.  Tongue protrusion not cooperative. On pain stimulation, no movement of all limbs. DTR 1+ and bilateral positive babinski. Sensation, coordination and  gait not tested.   ASSESSMENT/PLAN Mr. Harrington Jobe. is a 34 y.o. male with history of NICM EF 15-20%, CHF, DM, Obesity, tobacco use, OSA, and hx of cocaine use presenting with aphasia and right sided weakness. The patient received IV t-PA on Friday 05/03/2019 at 4:45 AM. Mechanical thrombectomy left M2.  Stroke:  L MCA infarct s/p tPA & IR with left M2 TICI3 reperfusion - embolic - likely secondary to severe cardiomyopathy with low EF and LV thrombus  Code Stroke CT Head - left MCA hyperdensity.   CTA H&N - emergent large vessel occlusion at the Left MCA bifurcation.   CT Perfusion - 60 mL of penumbra detected.   CT head repeat - Small region of loss of gray-white differentiation in the left frontal operculum   MRI head - pending  2D Echo - EF < 20%   2D with contrast - possible LV apical thrombus 0.8 x 0.6  Sars Corona Virus 2 - negative  LDL - 203  HgbA1c - 6.5  VTE prophylaxis - heparin IV  aspirin 81 mg daily prior to admission, now on heparin IV per stroke protocol.  Therapy recommendations:  pending  Disposition:  Pending  Encephalopathy  Eyes spontaneous open yesterday  Today eyes closed but rolling horizontally  Not following commands or moving extremities  Concerning for encephalopathy vs. Anoxic brain injury  MRI pending  May consider EEG if needed for prognostication  Acute Hypoxemic Respiratory failure  Intubated  Off sedation now   CCM on board  CXR - consistent with multifocal infection, edema, and/or ARDS (unchanged)  Cardiogenic vs Septic Shock  Fever, TMax 103.2->97.5->99->99.1->afebrile  Leukocytosis, WBCs 21->15.6->20.4->22.3  CXR - consistent with multifocal infection, edema, and/or ARDS (unchanged)  On Meropenum and Vancomycin 04/23/19>>  On hydrocortisone  U/A neg, Urine culture no growth   Blood cultures neg   Off pressors  LV thrombus Severe cardiomyopathy CHF  2D Echo - EF < 20%   2D with contrast -  possible LV apical thrombus 0.8 x 0.6  On heparin IV  On amiodarone gtt for wide-complex tachycardia and hypotension  Cardiology on board  V-tach  Elevated troponin  On amiodarone drip  Troponin trending down 16274->14226->2409   Cardiology on board  May consider cardioversion if needed  AKI  Creatinine - 1.10->2.64->5.17->4.09->3.91->3.64->3.18->3.11  BUN 44->48->54  renal consult 04/23/19  Still minimal urinary output, no signs of renal recovery  On CRRT  Hypotension w/ shock Hx Hypertension  Home BP meds: Coreg, Entresto  Now off pressors  SBP goal  >110  Stable  Shock liver, improving  GI on board  AST/ALT - >10000/10132 ->1920/2219->2846/4801->1059/3520->402/1909  INR 3.1->1.8->1.2  CK 602  Hyperlipidemia  Home Lipid lowering medication: Lipitor 40 mg daily  LDL 203, goal < 70  Current lipid lowering medication: Lipitor 80 mg daily   Continue statin at discharge  Diabetes type II, uncontrolled  Home diabetic meds: insulin  Current diabetic meds: SS insulin, Levemir 20, novolog 4u q6h  HgbA1c 6.5, goal < 7.0  CBG monitoring   SSI  Dysphagia . Secondary to stroke . NPO . On tube feeding @ 50 . Speech on board  Tobacco abuse  Current smoker  Mom confirmed tobacco use  Smoking cessation counseling will be provided  Other Stroke Risk Factors  ETOH use, advised to drink no more than 1 alcoholic beverage per day.  Hx cocaine abuse, UDS neg   Obesity, Body mass index is 35.57 kg/m., recommend weight loss, diet and exercise as appropriate   Obstructive sleep apnea - uses Cpap  Substance Abuse Hx of cocaine use  Other Active Problems  Code status - Full code  Hyperkalemia - 1.6->...->1.0->9.6->0.4->5.4  Metabolic acidosis, resolved  Depression on prozac  Hospital day # 6  This patient is critically ill and at significant risk of neurological worsening, death and care requires constant monitoring of vital signs,  hemodynamics,respiratory and cardiac monitoring, extensive review of multiple databases, frequent neurological assessment, discussion with family, other specialists and medical decision making of high complexity. I spent 35 minutes of neurocritical care time  in the care of  this patient. I also discussed with Dr. Elsworth Soho. I had long discussion with mom at bedside, updated pt current condition, treatment plan and potential prognosis, and answered all the questions. She expressed understanding and appreciation.    Rosalin Hawking, MD PhD Stroke Neurology 04/28/2019 10:29 AM   To contact Stroke Continuity provider, please refer to [email or web address] After hours, contact General Neurology

## 2019-04-28 NOTE — Progress Notes (Signed)
ANTICOAGULATION CONSULT NOTE  Pharmacy Consult for Heparin Indication: stroke, LV thrombus   No Known Allergies  Patient Measurements: Height: 6\' 1"  (185.4 cm) Weight: 269 lb 10 oz (122.3 kg) IBW/kg (Calculated) : 79.9 Heparin Dosing Weight: 107 kg  Vital Signs: Temp: 99.9 F (37.7 C) (02/11 2300) Temp Source: Bladder (02/11 2000) BP: 93/57 (02/11 2300) Pulse Rate: 75 (02/11 2325)  Labs: Recent Labs    04/26/19 0158 04/26/19 0927 04/27/19 1608 04/27/19 1704 04/28/19 0549 04/28/19 1158 04/28/19 1602 04/28/19 2310  HGB 12.7*  --   --   --  12.8*  --   --   --   HCT 37.2*  --   --   --  39.5  --   --   --   PLT 157  --   --   --  164  --   --   --   LABPROT 14.7  --   --   --   --   --   --   --   INR 1.2  --   --   --   --   --   --   --   HEPARINUNFRC  --    < > 0.81*   < > 0.72* 0.73*  --  0.55  CREATININE 3.64*   < > 3.09*  --  3.11*  --  3.07*  --   TROPONINIHS 2,409*  --   --   --   --   --   --   --    < > = values in this interval not displayed.    Estimated Creatinine Clearance: 46.5 mL/min (A) (by C-G formula based on SCr of 3.07 mg/dL (H)).  Assessment: 34 yo m initially presented with aphasia and right sided weaknesss. Found to have left MCA infarct - received TPA and revascularization with IR on 2/5. Now found to have LV apical thrombus on ECHO.  Pharmacy consulted to dose IV heparin.  Heparin level remains just above goal  Goal of Therapy:  Heparin level 0.3 - 0.5 units/ml Monitor platelets by anticoagulation protocol: Yes   Plan:  Reduce heparin infusion to 1000 units/hr Re-check heparin level in 6-8 hours  Narda Bonds, PharmD, [place] Pharmacist Phone: [phone]

## 2019-04-28 NOTE — Progress Notes (Signed)
eLink Physician-Brief Progress Note Patient Name: Carlos Brewer. DOB: 07/10/1985 MRN: [id number]   Date of Service  04/28/2019  HPI/Events of Note  Hypotension - BP = 93/57. SBP goal > 110.  eICU Interventions  Will order: 1. Norepinephrine IV Infusion. Titrate to SBP  > 110. 2. Monitor CVP now and Q 4 hours.      Intervention Category Major Interventions: Hypotension - evaluation and management  Ariyan Sinnett Eugene 04/28/2019, 11:06 PM

## 2019-04-28 NOTE — Progress Notes (Deleted)
eLink Physician-Brief Progress Note Patient Name: Carlos Brewer. DOB: [birth year] MRN: [id number]   Date of Service  04/28/2019  HPI/Events of Note  Pt with nausea  eICU Interventions  Zofran 4 mg iv Q 6  hours PRN nausea        Frederik Pear 04/28/2019, 4:19 AM

## 2019-04-28 NOTE — Progress Notes (Signed)
Assisted tele visit to patient with family member.  Nyimah Shadduck R, RN  

## 2019-04-28 NOTE — Progress Notes (Addendum)
The patient has been seen in conjunction with Cecilie Kicks, NP. All aspects of care have been considered and discussed. The patient has been personally interviewed, examined, and all clinical data has been reviewed.  Verbal stimuli causes the patient to open his eyes.  There is still no purposeful eye movement and slight disconjugate action. The status is unchanged.  Will consider switching amiodarone to per NG sometime over the next few days.   Progress Note  Patient Name: Carlos Brewer. Date of Encounter: 04/28/2019  Primary Cardiologist: Ida Rogue, MD  AHF:  Dr. Haroldine Laws  Subjective   Sedated on vent -opens eyes spontaneous for MRI later  Inpatient Medications    Scheduled Meds:  sodium chloride   Intravenous Once   chlorhexidine gluconate (MEDLINE KIT)  15 mL Mouth Rinse BID   Chlorhexidine Gluconate Cloth  6 each Topical Q0600   feeding supplement (PRO-STAT SUGAR FREE 64)  60 mL Per Tube TID   fentaNYL (SUBLIMAZE) injection  50 mcg Intravenous Once   insulin aspart  0-20 Units Subcutaneous Q4H   insulin aspart  4 Units Subcutaneous Q4H   insulin detemir  20 Units Subcutaneous Daily   mouth rinse  15 mL Mouth Rinse 10 times per day   pantoprazole sodium  40 mg Per Tube Q1200   sodium chloride flush  10-40 mL Intracatheter Q12H   Continuous Infusions:   prismasol BGK 4/2.5 500 mL/hr at 04/28/19 1018    prismasol BGK 4/2.5 200 mL/hr at 04/27/19 0245   sodium chloride     sodium chloride Stopped (04/28/19 0631)   amiodarone 30 mg/hr (04/28/19 1300)   feeding supplement (VITAL HIGH PROTEIN) 1,000 mL (04/28/19 0953)   heparin 1,500 Units/hr (04/28/19 1300)   meropenem (MERREM) IV Stopped (04/28/19 0628)   prismasol BGK 4/2.5 2,000 mL/hr at 04/28/19 1257   PRN Meds: sodium chloride, bisacodyl, docusate, fentaNYL, fentaNYL (SUBLIMAZE) injection, heparin, heparin, labetalol **AND** [DISCONTINUED] niCARDipine, midazolam, sodium chloride flush   Vital  Signs    Vitals:   04/28/19 1000 04/28/19 1100 04/28/19 1146 04/28/19 1200  BP: 120/85 104/77 132/78 122/85  Pulse: 78 73 80 79  Resp: 20 20 20 20   Temp: 98.1 F (36.7 C) 98.1 F (36.7 C)  97.9 F (36.6 C)  TempSrc:      SpO2: 100% 100% 100% 100%  Weight:      Height:        Intake/Output Summary (Last 24 hours) at 04/28/2019 1313 Last data filed at 04/28/2019 1300 Gross per 24 hour  Intake 2484.72 ml  Output 3970 ml  Net -1485.28 ml   Last 3 Weights 04/28/2019 04/27/2019 04/26/2019  Weight (lbs) 269 lb 10 oz 274 lb 14.6 oz 276 lb 0.3 oz  Weight (kg) 122.3 kg 124.7 kg 125.2 kg      Telemetry    SR with PVCs rare bigeminy and short burst of NSVT 4 beats.  - Personally Reviewed  ECG    No new - Personally Reviewed  Physical Exam   GEN: No acute distress. On vent and sedated. On CRRT   Neck: No JVD Cardiac: RRR, no murmurs, rubs, or gallops.  Respiratory: Clear to auscultation bilaterally. GI: Soft, nontender, non-distended  MS: No to trace lower ext edema; No deformity. Neuro:  Nonfocal  Psych: Normal affect   Labs    High Sensitivity Troponin:   Recent Labs  Lab 04/23/19 0945 04/23/19 1122 04/26/19 0158  TROPONINIHS 16,274* 14,226* 2,409*      Chemistry  Recent Labs  Lab 04/26/19 0158 04/26/19 1710 04/27/19 0427 04/27/19 1608 04/28/19 0549  NA 135   < > 136 136 137  K 3.9   < > 4.8 4.3 4.0  CL 96*   < > 101 100 97*  CO2 25   < > 21* 23 24  GLUCOSE 212*   < > 217* 160* 138*  BUN 42*   < > 44* 48* 54*  CREATININE 3.64*   < > 3.10* 3.09* 3.11*  CALCIUM 7.7*   < > 8.5* 8.4* 8.7*  PROT 6.0*  --  6.6  --  6.3*  ALBUMIN 2.8*   < > 2.9*  3.0* 2.8* 2.8*  2.8*  AST 2,846*  --  1,059*  --  402*  ALT 4,801*  --  3,520*  --  1,909*  ALKPHOS 104  --  128*  --  166*  BILITOT 3.1*  --  3.2*  --  2.4*  GFRNONAA 21*   < > 25* 25* 25*  GFRAA 24*   < > 29* 29* 29*  ANIONGAP 14   < > 14 13 16*   < > = values in this interval not displayed.      Hematology Recent Labs  Lab 04/25/19 0358 04/25/19 0358 04/25/19 2319 04/26/19 0158 04/28/19 0549  WBC 15.6*  --   --  20.4* 22.3*  RBC 4.08*  --   --  4.11* 4.16*  HGB 12.5*   < > 11.6* 12.7* 12.8*  HCT 36.5*   < > 34.0* 37.2* 39.5  MCV 89.5  --   --  90.5 95.0  MCH 30.6  --   --  30.9 30.8  MCHC 34.2  --   --  34.1 32.4  RDW 12.1  --   --  12.3 13.4  PLT 124*  --   --  157 164   < > = values in this interval not displayed.    BNP Recent Labs  Lab 04/23/19 0945  BNP 2,909.9*     DDimer No results for input(s): DDIMER in the last 168 hours.   Radiology    DG Chest Port 1 View  Result Date: 04/27/2019 CLINICAL DATA:  Intubated patient, update status EXAM: PORTABLE CHEST 1 VIEW COMPARISON:  04/26/2019 FINDINGS: Bilateral internal jugular central venous lines are unchanged in position, tip of right IJ catheter in right brachycephalic vein. Left internal jugular catheter in the proximal SVC. Endotracheal tube projecting between the clavicles unchanged. Gastric tube in the stomach, tip off the field of the radiograph. Cardiomediastinal contours remain markedly enlarged. Patchy bilateral airspace and interstitial changes are similar with perhaps slightly improved aeration at the left lung base when compared to the prior study. Findings of small right pleural effusion again suggested with thickening of the minor fissure in the right chest likely with effusion at the left base as well. Visualized skeletal structures are unremarkable. IMPRESSION: Perhaps slightly improved aeration at the left lung base. Otherwise no change in the bilateral patchy airspace and interstitial changes, likely associated with small effusions. Lines and tubes unchanged. Electronically Signed   By: Zetta Bills M.D.   On: 04/27/2019 08:03    Cardiac Studies   04/25/19 limited echo IMPRESSIONS     1. Left ventricular ejection fraction, by estimation, is <20%. The left  ventricle has severely decreased  function. The left ventricle demonstrates  global hypokinesis. The left ventricular internal cavity size was severely  dilated. Possible small 0.8 x  0.6 cm apical  thrombus. Somewhat subtle finding, could confirm with  cardiac MRI. However, if patient has had CVA, would be reasonable to  anticoagulate given severity of LV dysfunction if no contraindications to  anticoagulation.   2. Limited echo.   FINDINGS   Left Ventricle: Left ventricular ejection fraction, by estimation, is  <20%. The left ventricle has severely decreased function. The left  ventricle demonstrates global hypokinesis. The left ventricular internal  cavity size was severely dilated. There is  no left ventricular hypertrophy.    ECHO 05/05/2019 IMPRESSIONS     1. Left ventricular ejection fraction, by visual estimation, is <20%. The  left ventricle has severely decreased function. There is no left  ventricular hypertrophy.   2. Apex is not well-visualized. In setting of stroke with severe LV  systolic dysfunction, recommend repeat TTE with contrast to rule out LV  apical thrombus   3. Severely dilated left ventricular internal cavity size.   4. Left ventricular diastolic parameters are consistent with Grade III  diastolic dysfunction (restrictive).   5. Elevated left atrial pressure.   6. The left ventricle demonstrates global hypokinesis.   7. Global right ventricle has normal systolic function.The right  ventricular size is mildly enlarged.   8. Left atrial size was moderately dilated.   9. Right atrial size was normal.  10. The mitral valve is normal in structure. Mild mitral valve  regurgitation.  11. The tricuspid valve is normal in structure. Tricuspid valve  regurgitation is trivial.  12. The aortic valve is normal in structure. Aortic valve regurgitation is  not visualized. No evidence of aortic valve sclerosis or stenosis.  13. The pulmonic valve was not well visualized. Pulmonic valve  regurgitation  is not visualized.   FINDINGS   Left Ventricle: Left ventricular ejection fraction, by visual estimation,  is <20%. The left ventricle has severely decreased function. The left  ventricle demonstrates global hypokinesis. The left ventricular internal  cavity size was severely dilated  left ventricle. There is no left ventricular hypertrophy. Left ventricular  diastolic parameters are consistent with Grade III diastolic dysfunction  (restrictive). Elevated left atrial pressure.   Right Ventricle: The right ventricular size is mildly enlarged. No  increase in right ventricular wall thickness. Global RV systolic function  is has normal systolic function.   Left Atrium: Left atrial size was moderately dilated.   Right Atrium: Right atrial size was normal in size   Pericardium: There is no evidence of pericardial effusion.   Mitral Valve: The mitral valve is normal in structure. Mild mitral valve  regurgitation.   Tricuspid Valve: The tricuspid valve is normal in structure. Tricuspid  valve regurgitation is trivial.   Aortic Valve: The aortic valve is normal in structure. Aortic valve  regurgitation is not visualized. The aortic valve is structurally normal,  with no evidence of sclerosis or stenosis.   Pulmonic Valve: The pulmonic valve was not well visualized. Pulmonic valve  regurgitation is not visualized. Pulmonic regurgitation is not visualized.   Aorta: The aortic root is normal in size and structure.   Venous: IVC assessment for right atrial pressure unable to be performed  due to mechanical ventilation.  Patient Profile     34 y.o. male with history of NICM and myocarditis, followed by the advanced CHF service, presented 02/05 with acute MCA stroke treated with tPA and IR thrombectomy, now hypotensive, concerning for possible septic versus cardiogenic shock, SVT , VT.  Assessment & Plan     Shock most likely septic  -  after brief NSVT, pt in WCT, amio started, BP  dropped - Levo weaning and vasopressin continues doing better - believed to be septic shock - Coox today 99 --04/28/19   2. ARF - BUN/Cr 17/1.17 on admit - peak BUN/Cr 29/5.17 now 54/3.11  - anuric - Nephrology managing but after NSVT, they are keeping him fluid even, CRRT stable  - CVP 23 04/26/19 none today    3. Shock liver - LFTs initially ok, then AST & ALT > 10,000 - CK 602 - Improving. But still elevated   4. CVA acute  - IR did TPA and thrombectomy of R-MCA - apical thrombus seen on limited echo w/ Definity - s/p TPA and thrombectomy by IR - on heparin --for MRI today   5. Elevated troponin, hx CHF - EF < 20% is unchanged - trop >16,000 - hx NICM - no further workup at this time   6. Tachycardia - brief NSVT, then WCT, believed to be either atrial tach or junctional today some obvious ST Does have PVCs last bigeminy last pm  - on amio - because of pressors, no BB/CCB but almost off - no need for DCCV - Keep K+ > 4.0, Mg > 2.0 - K+ 4.8 today, Mg 2.7       For questions or updates, please contact [facility] Please consult [email or web address] for contact info under        Signed, Cecilie Kicks, NP  04/28/2019, 1:13 PM

## 2019-04-28 NOTE — Progress Notes (Addendum)
ANTICOAGULATION CONSULT NOTE  Pharmacy Consult for heparin Indication: stroke, LV thrombus   No Known Allergies  Patient Measurements: Height: 6\' 1"  (185.4 cm) Weight: 269 lb 10 oz (122.3 kg) IBW/kg (Calculated) : 79.9 Heparin Dosing Weight: 107 kg  Vital Signs: Temp: 97.9 F (36.6 C) (02/11 1600) BP: 116/82 (02/11 1600) Pulse Rate: 88 (02/11 1600)  Labs: Recent Labs    04/25/19 2319 04/26/19 0155 04/26/19 0158 04/26/19 0927 04/27/19 0427 04/27/19 0817 04/27/19 1608 04/27/19 1704 04/28/19 0204 04/28/19 0549 04/28/19 1158  HGB 11.6*   < > 12.7*  --   --   --   --   --   --  12.8*  --   HCT 34.0*  --  37.2*  --   --   --   --   --   --  39.5  --   PLT  --   --  157  --   --   --   --   --   --  164  --   LABPROT  --   --  14.7  --   --   --   --   --   --   --   --   INR  --   --  1.2  --   --   --   --   --   --   --   --   HEPARINUNFRC  --    < >  --    < >  --    < > 0.81*   < > 0.80* 0.72* 0.73*  CREATININE  --   --  3.64*   < > 3.10*  --  3.09*  --   --  3.11*  --   TROPONINIHS  --   --  2,409*  --   --   --   --   --   --   --   --    < > = values in this interval not displayed.    Estimated Creatinine Clearance: 45.9 mL/min (A) (by C-G formula based on SCr of 3.11 mg/dL (H)).  Assessment: 34 yo m initially presented with aphasia and right sided weaknesss. Found to have left MCA infarct - received TPA and revascularization with IR on 2/5. Now found to have LV apical thrombus on ECHO.  Pharmacy consulted to dose IV heparin.  Heparin level remains supra-therapeutic despite multiple rate reductions.  Lab drawn appropriately and no bleeding per RN.  Not getting heparin through CRRT.  Goal of Therapy:  Heparin level 0.3 - 0.5 units/ml Monitor platelets by anticoagulation protocol: Yes   Plan:  Reduce heparin infusion to 1100 units/hr Check 6 hr heparin level  Aibhlinn Kalmar D. Mina Marble, PharmD, BCPS, [place] 04/28/2019, 4:16 PM

## 2019-04-28 NOTE — Progress Notes (Signed)
NAME:  Jane Broughton., MRN:  [id number], DOB:  03/15/1986, LOS: 6 ADMISSION DATE:  05/08/2019, CONSULTATION DATE:  04/21/2019 REFERRING MD:  Dr. Leonel Ramsay, CHIEF COMPLAINT:  Slurred speech  Brief History   34 yo male smoker found to have slurred speech and Rt sided weakness.  CT head showed M2 occlusion.  Ultimately treated with thrombolytic and thrombectomy by IR.  Required intubation for airway protection.    Past Medical History  Systolic CHF with non ischemic CM, Cocaine abuse, OSA, DM Hx of CHF (EF 15%)  Hx myocarditis Smoker 1/2 ppd   Significant Hospital Events   2/05 Admit, tPA, IR thrombectomy 2/6 100% on fvent at 2300  Overnight requiring increasing levophed and phenylephrine.    2/6: switched to levophed, epi, started antibiotics, started on CRRT.  2/9  developed wide-complex tachycardia and hypotension, started on amiodarone drip, increase Levophed drip  Consults:  Neuro IR  Procedures:  ETT 2/05 >>  LIJ CVL 2/5 >> RIJ HD cath 2/6 >>  2/5-2/6:  TPA given at 428 am (total of 90 Mg) 520 am went to IR  S/P Lt common carotid arteriogram followed by complete revascularization of occluded LT MCA sup division mid M2 seg with x 1 pass with 74mx 40 mm solitaire X ret river device and penumbra aspiration with TICI 3 revascularization   Significant Diagnostic Tests:  CT angio head/neck 2/05 >> occlusion of Lt MCA bifurcation Echo 2/05 >> EF less than 20%, cannot rule out apical thrombus Limited echo 2/8 >> Possible small 0.8 x 0.6 cm apical thrombus  Micro Data:  SARS CoV2 PCR 2/05 >> negative Influenza PCR 2/05 >> negative Urine 2/6 >> ng resp 2/6 >> nml flora BC 2/6 >>ng  Antimicrobials:  mero 2/7 >> vanc 2/6 >> 2/9  Interim history/subjective:  Off Levophed. But remains critically ill, intubated, on CRRT Biting ET tube this morning per RN, hence given fentanyl and Versed Anuric   Objective   Blood pressure (!) 132/109, pulse 82, temperature  98.2 F (36.8 C), resp. rate 18, height 6' 1"  (1.854 m), weight 122.3 kg, SpO2 100 %.    Vent Mode: PSV;CPAP FiO2 (%):  [40 %] 40 % Set Rate:  [24 bmp] 24 bmp Vt Set:  [630 mL] 630 mL PEEP:  [5 cmH20] 5 cmH20 Pressure Support:  [15 cmH20] 15 cmH20 Plateau Pressure:  [17 cmH20-24 cmH20] 17 cmH20   Intake/Output Summary (Last 24 hours) at 04/28/2019 0913 Last data filed at 04/28/2019 0900 Gross per 24 hour  Intake 2718.9 ml  Output 3856 ml  Net -1137.1 ml   Filed Weights   04/26/19 0500 04/27/19 0424 04/28/19 0500  Weight: 125.2 kg 124.7 kg 122.3 kg    Examination:  General -young obese well-built, no distress Eyes -mild pallor, no icterus,no nystagmus ENT - ETT in place , catheter exit sites no erythema Cardiac -S1-S2 distant, no murmur Chest - no wheeze, bilateral ventilated breath sounds Abdomen - soft, non tender, + bowel sounds Extremities - no cyanosis, clubbing, or edema Skin - no rashes Neuro -eyes closed, roving eye movements, does not follow commands, right hemiplegia, moves left arm and leg to painful stimulus   Chest x-ray 2/10 personally reviewed -improved infiltrates from prior   Labs show normal electrolytes, elevated sugars, decreasing LFTs, stable leukocytosis Resolved Hospital Problem list     Assessment & Plan:  Shock, septic + cardiogenic: Now off Levophed Possibly UTI vs PNA.  Continue meropenem x 7ds total , negative cultures dc  hydrocortisone   #Cardiogenic shock -excellent coox reassuring LV apical thrombus Non ischemic CM with chronic systolic CHF. 2/9 wide-complex tachycardia - Dx 2018 - followed by [facility] in [place] - continue lipitor, digoxin - hold outpt coreg, zaroxolyn, entresto, aldactone, torsemide for now Cardiology following, continue IV amiodarone -IV heparin for apical thrombus   Acute hypoxemic respiratory failure: B infiltrates, ARDS vs cardiogenic pulm edema. Oxygenation has improved volume removal with CRRT.   - LTV ventilation  --Can start spontaneous breathing trials but will not extubate due to mental status  AKI;  Continue CRRT per renal, neg 50/h No signs of renal recovery, but hopeful now that off pressors  L MCA CVA: Likely embolic, s/p tpa 967 AM on 2/5 ,S/p IR clot removal.  Acute metabolic encephalopathy:  Possibly metabolic (AKI, shock, transaminitis) but residual sedation should have cleared by now-getting more concerned about anoxic damage,   -Using intermittent fentanyl, goal RASS 0 -Plan for MRI -Await repeat ammonia level   Transaminitis 2/2 hypoperfusion (shock liver) Coagulopathy - INR improved  (FFP x 2  2/6) , LFTs slight bump 2/9 due to increased hypotension, trending back down again Appreciate GI consult.  Caution with acetaminophen if needed for fever.  Follow LFTs every other day, now decreasing   Hx of cocaine abuse. depression. - not active per UDS  - continue prozac   DM type II poorly controlled. - SSI -Ct 20 units Levemir daily -Ct 4 units NovoLog every 4 hours TF coverage -CBG 1 40-180 acceptable -Expect to improve now that off steroids  Summary-shock resolved, main concern here is his poor mental status -off sedative drips now for 3 days.  Not sure if we are to expect renal recovery  Best practice:  Diet: NPO, start TFs DVT prophylaxis: SCDs GI prophylaxis: protonix Mobility: bed rest Code Status: full code Disposition: ICU Family -fianc [name] updated 2/11 -Per neurology note he designated her as [facility] POA .mother visits daily .some disagreement between them but both agree to plan for aggressive care, esp given his young age.  He has many supportive family and also has young children.    Care was coordinated with other consultants including cardiology and neurology  The patient is critically ill with multiple organ systems failure and requires high complexity decision making for assessment and support, frequent evaluation and titration of  therapies, application of advanced monitoring technologies and extensive interpretation of multiple databases. Critical Care Time devoted to patient care services described in this note independent of APP/resident  time is 35 minutes.     [name] MD. Shade Flood. [facility]  If no response to pager , please call [phone]     04/28/2019

## 2019-04-28 NOTE — Progress Notes (Signed)
eLink Physician-Brief Progress Note Patient Name: Bernardo Jodoin. DOB: 1985-07-08 MRN: [id number]   Date of Service  04/28/2019  HPI/Events of Note  Agitation - Patient biting on ETT associated with episodes of hypoxia down into 70's and bradycardia. Patient has bite block.   eICU Interventions  Will order: 1. Precedex IV infusion. Titrate to RASS = 0 to -1.      Intervention Category Major Interventions: Delirium, psychosis, severe agitation - evaluation and management  Bryley Kovacevic Eugene 04/28/2019, 7:26 PM

## 2019-04-28 NOTE — Progress Notes (Signed)
Patient ID: Carlos Bones., male   DOB: Jul 27, 1985, 34 y.o.   MRN: [id number] [facility] Progress Note   Assessment/ Plan:   1. Acute kidney Injury: Anuric and without evidence of renal recovery (hematuria noted in collection bag).  Etiology likely ATN with ischemic/nephrotoxic insults and exacerbated by cardiogenic shock.  Continue current CRRT prescription with increased ultrafiltration goal now that he is off pressors. 2.  Shock: Cardiogenic versus septic with data so far favoring the latter (pulmonary versus urinary etiology).  On meropenem and vancomycin.  Off pressors with improving shock markers 3.  Acute CVA from right MCA occlusion: Status post TPA/thrombectomy by IR.  Likely with significant residual neurological injury. 4.  Acute hypoxic respiratory failure: Remains ventilator dependent with efforts at trying to volume unloading with CRRT.  Subjective:   Tolerating CRRT without problems, weaned off of pressors overnight.   Objective:   BP 104/77   Pulse 73   Temp 98.1 F (36.7 C)   Resp 20   Ht 6' 1"  (1.854 m)   Wt 122.3 kg   SpO2 100%   BMI 35.57 kg/m   Intake/Output Summary (Last 24 hours) at 04/28/2019 1126 Last data filed at 04/28/2019 1100 Gross per 24 hour  Intake 2516.99 ml  Output 3733 ml  Net -1216.01 ml   Weight change: -2.4 kg  Physical Exam: Gen: Intubated, awakens to touch/calling out his name CVS: Pulse regular rhythm, normal rate, S1 and S2 normal Resp: Anteriorly transmitted breath sounds on ventilator. Abd: Soft, obese, nontender Ext: Trace-1+ lower extremity edema, 1+ presacral edema  Imaging: DG Chest Port 1 View  Result Date: 04/27/2019 CLINICAL DATA:  Intubated patient, update status EXAM: PORTABLE CHEST 1 VIEW COMPARISON:  04/26/2019 FINDINGS: Bilateral internal jugular central venous lines are unchanged in position, tip of right IJ catheter in right brachycephalic vein. Left internal jugular catheter in the proximal  SVC. Endotracheal tube projecting between the clavicles unchanged. Gastric tube in the stomach, tip off the field of the radiograph. Cardiomediastinal contours remain markedly enlarged. Patchy bilateral airspace and interstitial changes are similar with perhaps slightly improved aeration at the left lung base when compared to the prior study. Findings of small right pleural effusion again suggested with thickening of the minor fissure in the right chest likely with effusion at the left base as well. Visualized skeletal structures are unremarkable. IMPRESSION: Perhaps slightly improved aeration at the left lung base. Otherwise no change in the bilateral patchy airspace and interstitial changes, likely associated with small effusions. Lines and tubes unchanged. Electronically Signed   By: Zetta Bills M.D.   On: 04/27/2019 08:03    Labs: BMET Recent Labs  Lab 04/25/19 0358 04/25/19 1008 04/25/19 1600 04/25/19 2319 04/26/19 0158 04/26/19 1710 04/27/19 0427 04/27/19 1608 04/28/19 0549  NA 138  --  138 135 135 136 136 136 137  K 4.2   < > 4.3 3.8 3.9 4.7 4.8 4.3 4.0  CL 93*  --  93*  --  96* 99 101 100 97*  CO2 26  --  28  --  25 23 21* 23 24  GLUCOSE 171*  --  171*  --  212* 231* 217* 160* 138*  BUN 31*  --  35*  --  42* 42* 44* 48* 54*  CREATININE 4.09*  --  3.91*  --  3.64* 3.18* 3.10* 3.09* 3.11*  CALCIUM 7.7*  --  7.6*  --  7.7* 8.2* 8.5* 8.4* 8.7*  PHOS 4.0  --  4.9*  --  4.1 4.0 4.1 3.0 3.3   < > = values in this interval not displayed.   CBC Recent Labs  Lab 05/03/2019 0355 05/11/2019 0430 04/23/19 0208 04/23/19 0208 04/23/19 0945 04/23/19 1022 04/24/19 0843 04/24/19 0911 04/25/19 0358 04/25/19 2319 04/26/19 0158 04/28/19 0549  WBC 7.2   < > 21.9*   < > 16.2*   < > 17.6*  --  15.6*  --  20.4* 22.3*  NEUTROABS 4.3  --  19.2*  --  13.5*  --  15.8*  --   --   --   --   --   HGB 14.9   < > 15.4   < > 14.6   < > 13.7   < > 12.5* 11.6* 12.7* 12.8*  HCT 42.8   < > 45.2   < >  44.0   < > 39.8   < > 36.5* 34.0* 37.2* 39.5  MCV 89.0   < > 90.8   < > 92.6   < > 92.3  --  89.5  --  90.5 95.0  PLT 243   < > 171   < > 145*   < > 137*  --  124*  --  157 164   < > = values in this interval not displayed.    Medications:    . sodium chloride   Intravenous Once  . chlorhexidine gluconate (MEDLINE KIT)  15 mL Mouth Rinse BID  . Chlorhexidine Gluconate Cloth  6 each Topical Q0600  . feeding supplement (PRO-STAT SUGAR FREE 64)  60 mL Per Tube TID  . fentaNYL (SUBLIMAZE) injection  50 mcg Intravenous Once  . insulin aspart  0-20 Units Subcutaneous Q4H  . insulin aspart  4 Units Subcutaneous Q4H  . insulin detemir  20 Units Subcutaneous Daily  . mouth rinse  15 mL Mouth Rinse 10 times per day  . pantoprazole sodium  40 mg Per Tube Q1200  . sodium chloride flush  10-40 mL Intracatheter Q12H   Elmarie Shiley, MD 04/28/2019, 11:26 AM

## 2019-04-29 ENCOUNTER — Inpatient Hospital Stay (HOSPITAL_COMMUNITY): Payer: No Typology Code available for payment source

## 2019-04-29 DIAGNOSIS — I63312 Cerebral infarction due to thrombosis of left middle cerebral artery: Secondary | ICD-10-CM | POA: Diagnosis not present

## 2019-04-29 DIAGNOSIS — K7200 Acute and subacute hepatic failure without coma: Secondary | ICD-10-CM | POA: Diagnosis not present

## 2019-04-29 DIAGNOSIS — Z452 Encounter for adjustment and management of vascular access device: Secondary | ICD-10-CM

## 2019-04-29 DIAGNOSIS — J9600 Acute respiratory failure, unspecified whether with hypoxia or hypercapnia: Secondary | ICD-10-CM | POA: Diagnosis not present

## 2019-04-29 DIAGNOSIS — I6340 Cerebral infarction due to embolism of unspecified cerebral artery: Secondary | ICD-10-CM

## 2019-04-29 DIAGNOSIS — I639 Cerebral infarction, unspecified: Secondary | ICD-10-CM | POA: Diagnosis not present

## 2019-04-29 DIAGNOSIS — I513 Intracardiac thrombosis, not elsewhere classified: Secondary | ICD-10-CM

## 2019-04-29 DIAGNOSIS — J9601 Acute respiratory failure with hypoxia: Secondary | ICD-10-CM | POA: Diagnosis not present

## 2019-04-29 DIAGNOSIS — I5023 Acute on chronic systolic (congestive) heart failure: Secondary | ICD-10-CM | POA: Diagnosis not present

## 2019-04-29 DIAGNOSIS — N179 Acute kidney failure, unspecified: Secondary | ICD-10-CM | POA: Diagnosis not present

## 2019-04-29 DIAGNOSIS — R6521 Severe sepsis with septic shock: Secondary | ICD-10-CM | POA: Diagnosis not present

## 2019-04-29 DIAGNOSIS — I472 Ventricular tachycardia: Secondary | ICD-10-CM | POA: Diagnosis not present

## 2019-04-29 DIAGNOSIS — E785 Hyperlipidemia, unspecified: Secondary | ICD-10-CM | POA: Diagnosis not present

## 2019-04-29 DIAGNOSIS — A419 Sepsis, unspecified organism: Secondary | ICD-10-CM | POA: Diagnosis not present

## 2019-04-29 LAB — RENAL FUNCTION PANEL
Albumin: 2.8 g/dL — ABNORMAL LOW (ref 3.5–5.0)
Albumin: 2.6 g/dL — ABNORMAL LOW (ref 3.5–5.0)
Anion gap: 14 (ref 5–15)
Anion gap: 13 (ref 5–15)
BUN: 63 mg/dL — ABNORMAL HIGH (ref 6–20)
BUN: 66 mg/dL — ABNORMAL HIGH (ref 6–20)
CO2: 23 mmol/L (ref 22–32)
CO2: 22 mmol/L (ref 22–32)
Calcium: 8.8 mg/dL — ABNORMAL LOW (ref 8.9–10.3)
Calcium: 8.4 mg/dL — ABNORMAL LOW (ref 8.9–10.3)
Chloride: 99 mmol/L (ref 98–111)
Chloride: 101 mmol/L (ref 98–111)
Creatinine, Ser: 3.23 mg/dL — ABNORMAL HIGH (ref 0.61–1.24)
Creatinine, Ser: 3.44 mg/dL — ABNORMAL HIGH (ref 0.61–1.24)
GFR calc Af Amer: 27 mL/min — ABNORMAL LOW (ref >60)
GFR calc Af Amer: 25 mL/min — ABNORMAL LOW (ref >60)
GFR calc non Af Amer: 24 mL/min — ABNORMAL LOW (ref >60)
GFR calc non Af Amer: 22 mL/min — ABNORMAL LOW (ref >60)
Glucose, Bld: 222 mg/dL — ABNORMAL HIGH (ref 70–99)
Glucose, Bld: 169 mg/dL — ABNORMAL HIGH (ref 70–99)
Phosphorus: 4.3 mg/dL (ref 2.5–4.6)
Phosphorus: 5 mg/dL — ABNORMAL HIGH (ref 2.5–4.6)
Potassium: 4 mmol/L (ref 3.5–5.1)
Potassium: 4 mmol/L (ref 3.5–5.1)
Sodium: 136 mmol/L (ref 135–145)
Sodium: 136 mmol/L (ref 135–145)

## 2019-04-29 LAB — HEPATIC FUNCTION PANEL
ALT: 1338 U/L — ABNORMAL HIGH (ref 0–44)
AST: 216 U/L — ABNORMAL HIGH (ref 15–41)
Albumin: 2.9 g/dL — ABNORMAL LOW (ref 3.5–5.0)
Alkaline Phosphatase: 160 U/L — ABNORMAL HIGH (ref 38–126)
Bilirubin, Direct: 0.7 mg/dL — ABNORMAL HIGH (ref 0.0–0.2)
Indirect Bilirubin: 1.1 mg/dL — ABNORMAL HIGH (ref 0.3–0.9)
Total Bilirubin: 1.8 mg/dL — ABNORMAL HIGH (ref 0.3–1.2)
Total Protein: 6.9 g/dL (ref 6.5–8.1)

## 2019-04-29 LAB — CBC
HCT: 42.2 % (ref 39.0–52.0)
Hemoglobin: 13.4 g/dL (ref 13.0–17.0)
MCH: 30.7 pg (ref 26.0–34.0)
MCHC: 31.8 g/dL (ref 30.0–36.0)
MCV: 96.6 fL (ref 80.0–100.0)
Platelets: 172 10*3/uL (ref 150–400)
RBC: 4.37 MIL/uL (ref 4.22–5.81)
RDW: 14 % (ref 11.5–15.5)
WBC: 24 10*3/uL — ABNORMAL HIGH (ref 4.0–10.5)
nRBC: 0.8 % — ABNORMAL HIGH (ref 0.0–0.2)

## 2019-04-29 LAB — GLUCOSE, CAPILLARY
Glucose-Capillary: 164 mg/dL — ABNORMAL HIGH (ref 70–99)
Glucose-Capillary: 177 mg/dL — ABNORMAL HIGH (ref 70–99)
Glucose-Capillary: 221 mg/dL — ABNORMAL HIGH (ref 70–99)
Glucose-Capillary: 160 mg/dL — ABNORMAL HIGH (ref 70–99)
Glucose-Capillary: 158 mg/dL — ABNORMAL HIGH (ref 70–99)
Glucose-Capillary: 157 mg/dL — ABNORMAL HIGH (ref 70–99)

## 2019-04-29 LAB — MAGNESIUM: Magnesium: 2.7 mg/dL — ABNORMAL HIGH (ref 1.7–2.4)

## 2019-04-29 LAB — HEPARIN LEVEL (UNFRACTIONATED)
Heparin Unfractionated: 0.26 IU/mL — ABNORMAL LOW (ref 0.30–0.70)
Heparin Unfractionated: 0.19 IU/mL — ABNORMAL LOW (ref 0.30–0.70)

## 2019-04-29 LAB — COOXEMETRY PANEL
Carboxyhemoglobin: 1.1 % (ref 0.5–1.5)
Methemoglobin: 0.5 % (ref 0.0–1.5)
O2 Saturation: 98.9 %
Total hemoglobin: 13.2 g/dL (ref 12.0–16.0)

## 2019-04-29 NOTE — Progress Notes (Signed)
STROKE TEAM PROGRESS NOTE   INTERVAL HISTORY Pt mom at bedside. Pt still open eyes spontaneously, eyes rolling horizontally, more right side rolling than left, not following commands, not moving extremities. MRI done and reviewed with mom at beside, showed bilaterally multifocal moderate infarcts and right large cerebellar infarct. Prognosis poor at this time, but mom still hopeful.    OBJECTIVE Vitals:   04/29/19 0930 04/29/19 0945 04/29/19 1000 04/29/19 1015  BP: 133/83  (!) 125/92   Pulse: 83 65 64 64  Resp: (!) 25 20 20 20   Temp:      TempSrc:      SpO2: 100% 100% 100% 100%  Weight:      Height:        CBC:  Recent Labs  Lab 04/23/19 0945 04/23/19 1022 04/24/19 0843 04/24/19 0911 04/28/19 0549 04/29/19 0512  WBC 16.2*   < > 17.6*   < > 22.3* 24.0*  NEUTROABS 13.5*  --  15.8*  --   --   --   HGB 14.6   < > 13.7   < > 12.8* 13.4  HCT 44.0   < > 39.8   < > 39.5 42.2  MCV 92.6   < > 92.3   < > 95.0 96.6  PLT 145*   < > 137*   < > 164 172   < > = values in this interval not displayed.    Basic Metabolic Panel:  Recent Labs  Lab 04/28/19 0549 04/28/19 0549 04/28/19 1602 04/29/19 0512  NA 137   < > 135 136  K 4.0   < > 4.0 4.0  CL 97*   < > 100 99  CO2 24   < > 24 23  GLUCOSE 138*   < > 177* 222*  BUN 54*   < > 56* 63*  CREATININE 3.11*   < > 3.07* 3.23*  CALCIUM 8.7*   < > 8.4* 8.8*  MG 2.9*  --   --  2.7*  PHOS 3.3   < > 4.2 4.3   < > = values in this interval not displayed.   Lipid Panel:     Component Value Date/Time   CHOL 265 (H) 04/23/2019 0327   TRIG 191 (H) 04/23/2019 0327   HDL 24 (L) 04/23/2019 0327   CHOLHDL 11.0 04/23/2019 0327   VLDL 38 04/23/2019 0327   LDLCALC 203 (H) 04/23/2019 0327   HgbA1c:  Lab Results  Component Value Date   HGBA1C 6.5 (H) 04/23/2019   Urine Drug Screen:     Component Value Date/Time   LABOPIA NONE DETECTED 04/23/2019 0930   COCAINSCRNUR NONE DETECTED 04/23/2019 0930   COCAINSCRNUR NONE DETECTED 02/04/2018  1010   LABBENZ POSITIVE (A) 04/23/2019 0930   AMPHETMU NONE DETECTED 04/23/2019 0930   THCU NONE DETECTED 04/23/2019 0930   LABBARB NONE DETECTED 04/23/2019 0930    Alcohol Level     Component Value Date/Time   ETH <10 05/04/2019 0355    IMAGING CT HEAD CODE STROKE WO CONTRAST 04/28/2019 1. Asymmetric hyperdensity of a Left MCA branch in the Sylvian fissure suspicious for emergent large vessel occlusion in the setting.  2. No associated acute cortically based infarct, ASPECTS 10. No acute hemorrhage.  3. Chronic right frontal operculum encephalomalacia.   CT Code Stroke CTA Head W/WO contrast CT Code Stroke CTA Neck W/WO contrast 05/05/2019 1. Positive for emergent large vessel occlusion at the Left MCA bifurcation. There is reconstitution of the dominant posterior division branch.  2. Elsewhere negative CTA head and neck. Dominant right vertebral artery which supplies the basilar. Fetal type PCA origins.   CT CEREBRAL PERFUSION W CONTRAST 05/03/2019 1. CTP detects evidence of Left MCA territory oligemia concordant with the CTA findings.  2. No infarct core detected with standard CBF <30%.  3. 60 mL of penumbra detected with standard T-max > 6s.   CT HEAD WO CONTRAST 04/23/2019 Small region of loss of gray-white differentiation in the left frontal operculum consistent with a fairly small area of acute infarction compared to the previous perfusion abnormality. No hemorrhage or mass effect. Areas of low-density in the cerebellum that I favor are artifactual. There did not seem to be any abnormalities in that region on the previous examinations.   MRI / MRA Head WO Contrast  04/28/2019 Extensive acute infarction in the right cerebellum with swelling and petechial blood products. Acute infarction of the superior cerebellar peduncle on the right, with some infarction in the mid brain. Extensive acute infarction in the right temporal lobe and insula. Small acute infarctions in the frontal  operculum region and right parietal region. Infarction affecting the right posterior basal ganglia and caudate nucleus. Petechial blood products present without frank hematoma. Acute infarction in the left insula and frontal operculum. Petechial blood products present without frank hematoma. Small punctate acute infarctions higher in the left frontoparietal junction region. No large or medium vessel occlusion seen presently. Restoration of flow in the left MCA. Flow appears to be present in posterior circulation main vessels and branch vessels presently.   DG CHEST PORT 1 VIEW 04/29/2019 Patchy opacities consistent with the given clinical history. Tubes and lines as described.  04/27/2019 Perhaps slightly improved aeration at the left lung base. Otherwise no change in the bilateral patchy airspace and interstitial changes, likely associated with small effusions. Lines and tubes unchanged. 04/26/2019 0854 1. Lines and tubes as described above. 2. Cardiomegaly with basilar airspace disease, likely associated with effusions. 3. Interstitial and alveolar opacities elsewhere may represent a combination of infection and edema and are little changed. 04/25/2019 0946 Overall improved aeration from the prior exam although persistent opacities and small effusions are noted. 04/23/19 1636 1. Interval placement of a right neck vascular sheath, tip positioned over the right brachiocephalic vein. 2.  Other support apparatus in unchanged position. 3. Slight interval increase in diffuse bilateral interstitial and heterogeneous airspace opacity, consistent with multifocal infection, edema, and/or ARDS. 4. Cardiomegaly. 04/23/2019 0757 Increasing patchy bilateral airspace opacities which may represent edema or infection. 05/06/2019 1958 1. Interval placement of neck vascular catheter, tip positioned near the superior cavoatrial junction.  2. Interval retraction of endotracheal tube, now positioned just above the thoracic inlet,  approximately 6.5 cm above the carina.  3. Interval placement of esophagogastric tube, tip and side port below the diaphragm. 4. Stable cardiomegaly and diffuse bilateral interstitial airspace opacity, likely edema.  05/10/2019 0841 1. Endotracheal tube tip noted just above the carina. Proximal repositioning of 2-3 should be considered. 2. Cardiomegaly again noted. Diffuse bilateral pulmonary infiltrates/edema. Small left pleural effusion.   ECHOCARDIOGRAM COMPLETE 05/10/2019 1. Left ventricular ejection fraction, by visual estimation, is <20%. The left ventricle has severely decreased function. There is no left ventricular hypertrophy.   2. Apex is not well-visualized. In setting of stroke with severe LV systolic dysfunction, recommend repeat TTE with contrast to rule out LV apical thrombus   3. Severely dilated left ventricular internal cavity size.   4. Left ventricular diastolic parameters are consistent with Grade III  diastolic dysfunction (restrictive).   5. Elevated left atrial pressure.   6. The left ventricle demonstrates global hypokinesis.   7. Global right ventricle has normal systolic function.The right ventricular size is mildly enlarged.   8. Left atrial size was moderately dilated.   9. Right atrial size was normal.  10. The mitral valve is normal in structure. Mild mitral valve regurgitation.  11. The tricuspid valve is normal in structure. Tricuspid valve regurgitation is trivial.  12. The aortic valve is normal in structure. Aortic valve regurgitation is not visualized. No evidence of aortic valve sclerosis or stenosis.  13. The pulmonic valve was not well visualized. Pulmonic valve regurgitation is not visualized.   Interventional Neuro Radiology - Cerebral Angiogram with Intervention 05/10/2019 S/P Lt common carotid arteriogram followed by complete revascularization of occluded LT MCA sup division mid M2 seg with x 1 pass with 48mx 40 mm solitaire X ret river device and penumbra  aspiration with TICI 3 revascularization  ECG - SR rate 93 BPM. (See cardiology reading for complete details)  PHYSICAL EXAM   Temp:  [97 F (36.1 C)-100 F (37.8 C)] 97.5 F (36.4 C) (02/12 0915) Pulse Rate:  [35-90] 64 (02/12 1015) Resp:  [17-25] 20 (02/12 1015) BP: (91-133)/(56-93) 125/92 (02/12 1000) SpO2:  [98 %-100 %] 100 % (02/12 1015) Arterial Line BP: (90-143)/(56-88) 116/69 (02/12 1015) FiO2 (%):  [40 %] 40 % (02/12 0810) Weight:  [121.5 kg] 121.5 kg (02/12 0500)  General - Well nourished, well developed, intubated off sedation.  Ophthalmologic - fundi not visualized due to noncooperation.  Cardiovascular - Regular rhythm but tachycardia.  Neuro - intubated off sedation and off pressors on weaning, eyes spontaneous open not following commands. Eyes slow horizontal rolling movement, right direction more than left, not consistently blinking to visual threat bilaterally, PERRL. Corneal reflex present, gag and cough present. Breathing over the vent.  Facial symmetry not able to test due to ET tube.  Tongue protrusion not cooperative. On pain stimulation, no movement of all limbs. DTR 1+ and bilateral positive babinski. Sensation, coordination and gait not tested.   ASSESSMENT/PLAN Mr. LLloyd Ayo is a 34y.o. male with history of NICM EF 15-20%, CHF, DM, Obesity, tobacco use, OSA, and hx of cocaine use presenting with aphasia and right sided weakness. The patient received IV t-PA on Friday 05/10/2019 at 4:45 AM. Mechanical thrombectomy left M2.  Stroke:  Extensive R cerebellar and R cerebral infarcts and L MCA infarcts s/p tPA & IR with left M2 TICI3 reperfusion - embolic - likely secondary to severe cardiomyopathy with low EF and LV thrombus  Code Stroke CT Head - left MCA hyperdensity.   CTA H&N - emergent large vessel occlusion at the Left MCA bifurcation.   CT Perfusion - 60 mL of penumbra detected.   CT head repeat - Small region of loss of gray-white  differentiation in the left frontal operculum   MRI head - extensive R cerebellar infarct w/ petechial hemorrhage. R cerebellar peduncle and midbrain infarct. Extensive R temporal lobe and insula infarct w/ R frontal operculum and parietal infarcts. R posterior basal ganglia and caudate head infarct w/ petechial hemorrhage. L insula and L frontal operculum infarcts w/ petechial hemorrhage. Small L frontoparietal jxn infarcts.   MRA head restoration L MCA flow. Flow in posterior circulation.   2D Echo - EF < 20%   2D with contrast - possible LV apical thrombus 0.8 x 0.6  Sars Corona Virus 2 - negative  LDL -  203  HgbA1c - 6.5  VTE prophylaxis - heparin IV  aspirin 81 mg daily prior to admission, now on heparin IV per stroke protocol.  Therapy recommendations:  pending  Disposition:  Pending  Encephalopathy  Eyes spontaneous open yesterday  Today eyes closed but rolling horizontally  Not following commands or moving extremities  MRI extensive R and L brain and R cerebellar infarcts  Prognosis is poor, concerning for vegetative state vs minimally conscious state  Mom still hopeful, will need ongoing [place] discussion  Acute Hypoxemic Respiratory failure  Intubated  Off sedation now   CCM on board  CXR - consistent with multifocal infection, edema, and/or ARDS (unchanged)  Prn precedex as BP will allow  Cardiogenic  Fever, TMax 103.2->97.5->99->99.1->afebrile->100  Leukocytosis, WBCs 21->15.6->20.4->22.3->24.0   CXR - consistent with multifocal infection, edema, and/or ARDS (unchanged)  On Meropenum and Vancomycin 04/23/19>>  On hydrocortisone   U/A neg, Urine culture no growth   Blood cultures neg   Off pressors  LV thrombus Severe nonischemic cardiomyopathy Chronic systolic CHF  2D Echo - EF < 20%   2D with contrast - possible LV apical thrombus 0.8 x 0.6  On heparin IV  On amiodarone gtt for wide-complex tachycardia and hypotension. Card plans  to change to enteral form over next few days  Cardiology on board  V-tach  Elevated troponin  On amiodarone drip  Troponin trending down 16274->14226->2409   Cardiology on board  May consider cardioversion if needed  AKI  Creatinine - 1.10->2.64->5.17->4.09->3.91->3.64->3.18->3.11->3.07->3.23   BUN 44->48->54->56->63   renal consult 04/23/19  Still minimal urinary output, no signs of renal recovery  On CRRT  Hypotension w/ shock Hx Hypertension  Home BP meds: Coreg, Entresto  Now off pressors  SBP goal  >110  Stable  Shock liver d/t hypoperfusion, improving  GI on board  AST/ALT - >10000/10132 ->2846/4801->1059/3520->402/1909->216/1338  INR 3.1->1.8->1.2  CK 602  Hyperlipidemia  Home Lipid lowering medication: Lipitor 40 mg daily  LDL 203, goal < 70  Current lipid lowering medication: Lipitor 80 mg daily   Continue statin at discharge  Diabetes type II, uncontrolled  Home diabetic meds: insulin  Current diabetic meds: SS insulin, Levemir 20, novolog 4u q6h  HgbA1c 6.5, goal < 7.0  CBG monitoring   SSI  Dysphagia . Secondary to stroke . NPO . On tube feeding @ 50 . Speech on board  Tobacco abuse  Current smoker  Mom confirmed tobacco use  Smoking cessation counseling will be provided  Other Stroke Risk Factors  ETOH use, advised to drink no more than 1 alcoholic beverage per day.  Hx cocaine abuse, UDS neg   Obesity, Body mass index is 35.34 kg/m., recommend weight loss, diet and exercise as appropriate   Obstructive sleep apnea - uses Cpap  Substance Abuse Hx of cocaine use  Other Active Problems  Code status - Full code  Hyperkalemia - 1.2->...->1.9->7.5->8.8->3.2->5.4->9.8  Metabolic acidosis, resolved  Depression on prozac  Hospital day # 7  This patient is critically ill and at significant risk of neurological worsening, death and care requires constant monitoring of vital signs, hemodynamics,respiratory  and cardiac monitoring, extensive review of multiple databases, frequent neurological assessment, discussion with family, other specialists and medical decision making of high complexity. I spent 40 minutes of neurocritical care time  in the care of  this patient. I had long discussion with mom at bedside, updated pt current condition, treatment plan and potential prognosis, and answered all the questions. She expressed understanding and appreciation.  Rosalin Hawking, MD PhD Stroke Neurology 04/29/2019 10:53 AM   To contact Stroke Continuity provider, please refer to [email or web address] After hours, contact General Neurology

## 2019-04-29 NOTE — Progress Notes (Signed)
ANTICOAGULATION CONSULT NOTE  Pharmacy Consult for heparin Indication: stroke, LV thrombus   No Known Allergies  Patient Measurements: Height: 6\' 1"  (185.4 cm) Weight: 267 lb 13.7 oz (121.5 kg) IBW/kg (Calculated) : 79.9 Heparin Dosing Weight: 107 kg  Vital Signs: Temp: 97.3 F (36.3 C) (02/12 0730) Temp Source: Bladder (02/12 0400) BP: 105/75 (02/12 0730) Pulse Rate: 60 (02/12 0730)  Labs: Recent Labs    04/28/19 0549 04/28/19 0549 04/28/19 1158 04/28/19 1602 04/28/19 2310 04/29/19 0512  HGB 12.8*  --   --   --   --  13.4  HCT 39.5  --   --   --   --  42.2  PLT 164  --   --   --   --  172  HEPARINUNFRC 0.72*   < > 0.73*  --  0.55 0.26*  CREATININE 3.11*  --   --  3.07*  --  3.23*   < > = values in this interval not displayed.    Estimated Creatinine Clearance: 44 mL/min (A) (by C-G formula based on SCr of 3.23 mg/dL (H)).  Assessment: 34 yo m initially presented with aphasia and right sided weaknesss. Found to have left MCA infarct - received TPA and revascularization with IR on 2/5. Now found to have LV apical thrombus on ECHO.  Pharmacy consulted to dose IV heparin.  Heparin level now slightly subtherapeutic after rate reductions for supratherapeutic levels. CBC wnl. No bleeding or issues with infusion per discussion with RN. Not getting heparin through CRRT.  Goal of Therapy:  Heparin level 0.3 - 0.5 units/ml Monitor platelets by anticoagulation protocol: Yes   Plan:  Increase heparin infusion slightly to 1050 units/hr Check 8 hr heparin level Monitor daily CBC, s/sx bleeding   Elicia Lamp, PharmD, BCPS Please check AMION for all [place] contact numbers Clinical Pharmacist 04/29/2019 7:45 AM

## 2019-04-29 NOTE — Progress Notes (Signed)
NAME:  Carlos Diaz., MRN:  [id number], DOB:  Oct 24, 1985, LOS: 7 ADMISSION DATE:  04/20/2019, CONSULTATION DATE:  05/15/2019 REFERRING MD:  Dr. Leonel Ramsay, CHIEF COMPLAINT:  Slurred speech  Brief History   34 yo male smoker found to have slurred speech and Rt sided weakness.  CT head showed M2 occlusion.  Ultimately treated with thrombolytic and thrombectomy by IR.  Required intubation for airway protection.  Past Medical History  Systolic CHF with non ischemic CM, Cocaine abuse, OSA, DM Hx of CHF (EF 15%)  Hx myocarditis Smoker 1/2 ppd   Significant Hospital Events   2/05 Admit, tPA, IR thrombectomy 2/6 100% on fvent at 2300  Overnight requiring increasing levophed and phenylephrine.    2/6: switched to levophed, epi, started antibiotics, started on CRRT.  2/9  developed wide-complex tachycardia and hypotension, started on amiodarone drip, increase Levophed drip 2/11 overnight agitation with significant biting of ETT requiring sedation with precedex  Consults:  Neuro IR  Procedures:  ETT 2/05 >>  [facility]J CVL 2/5 >> RIJ HD cath 2/6 >>  2/5-2/6:  TPA given at 428 am (total of 90 Mg) 520 am went to IR  S/P Lt common carotid arteriogram followed by complete revascularization of occluded LT MCA sup division mid M2 seg with x 1 pass with 65mx 40 mm solitaire X ret river device and penumbra aspiration with TICI 3 revascularization   Significant Diagnostic Tests:  CT angio head/neck 2/05 >> occlusion of Lt MCA bifurcation Echo 2/05 >> EF less than 20%, cannot rule out apical thrombus Limited echo 2/8 >> Possible small 0.8 x 0.6 cm apical thrombus MRI brain 2/11 > extensive acute infarction of multiple areas without large or medium vessel occlusion  Micro Data:  SARS CoV2 PCR 2/05 >> negative Influenza PCR 2/05 >> negative Urine 2/6 >> ng resp 2/6 >> nml flora BC 2/6 >>ng  Antimicrobials:  mero 2/7 >> vanc 2/6 >> 2/9  Interim history/subjective:  Back on low  levophed as well as precedex due to biting of tube overnight.   Objective   Blood pressure 105/75, pulse 60, temperature (!) 97.3 F (36.3 C), resp. rate (!) 24, height _0  (1.854 m), weight 121.5 kg, SpO2 100 %. CVP:  [14 mmHg-31 mmHg] 19 mmHg  Vent Mode: PCV FiO2 (%):  [40 %] 40 % Set Rate:  [24 bmp] 24 bmp PEEP:  [5 cmH20] 5 cmH20 Pressure Support:  [15 cmH20] 15 cmH20 Plateau Pressure:  [20 cOFH21-97cmH20] 20 cmH20   Intake/Output Summary (Last 24 hours) at 04/29/2019 0750 Last data filed at 04/29/2019 0700 Gross per 24 hour  Intake 2664.35 ml  Output 4187 ml  Net -1522.65 ml   Filed Weights   04/27/19 0424 04/28/19 0500 04/29/19 0500  Weight: 124.7 kg 122.3 kg 121.5 kg    Examination:  General -young obese well-built, no distress Eyes -mild pallor, no icterus,no nystagmus ENT - ETT in place  Cardiac - RRR, no M/R/G Chest - no wheeze, bilateral ventilated breath sounds Abdomen - soft, non tender, + bowel sounds Extremities - no cyanosis, clubbing, or edema Skin - no rashes Neuro - sedated, does not follow commands, right hemiplegia, moves left arm and leg to painful stimulus   Assessment & Plan:   Shock, septic + cardiogenic - possibly UTI vs PNA.  - Continue meropenem x 7ds total , negative cultures - Levophed back on (mainly for boost while needing extra precedex).  LV apical thrombus.  Non ischemic CM with chronic  systolic CHF. - continie digoxin, amio, heparin per cards - hold outpt lipitor, coreg, zaroxolyn, entresto, aldactone, torsemide for now.  Acute hypoxemic respiratory failure: B infiltrates, ARDS vs cardiogenic pulm edema. Oxygenation has improved volume removal with CRRT.  - LTV ventilation  - Can continue spontaneous breathing trials but will not extubate due to mental status  AKI - No signs of renal recovery quite yet but hopeful to see some soon - Continue CRRT per renal, neg 50/h  L MCA CVA: Likely embolic, s/p tpa 520 AM on 2/5 ,S/p IR  clot removal.  Acute metabolic encephalopathy: Possibly metabolic (AKI, shock, transaminitis) but residual sedation should have cleared by now.  MRI 2/11 with extensive infarct.  Suspect ongoing encephalopathy is due to the extent of his infarct.  - Continue sedation with precedex gtt and PRN fentanyl, goal RASS 0 - Per neuro. - Likely has poor prognosis for meaningful recovery.  Transaminitis 2/2 hypoperfusion (shock liver). Coagulopathy - INR improved  (FFP x 2  2/6), LFTs slight bump 2/9 due to increased hypotension, trending back down again - Appreciate GI input. - Hold lipitor - Caution with acetaminophen if needed for fever.  - Follow LFTs every other day, now decreasing  Hx of cocaine abuse (not active per UDS), depression. - continue prozac  DM type II poorly controlled.  Worsened while on steroids but expect to improve now that steroids are off. - SSI - Ct 20 units Levemir daily - Ct 4 units NovoLog every 4 hours TF coverage   Best practice:  Diet: TF's DVT prophylaxis: SCDs GI prophylaxis: protonix Mobility: bed rest Code Status: full code Disposition: ICU Family - fianc [name] updated 2/11 -Per neurology note he designated her as [facility] POA .mother visits daily .some disagreement between them but both agree to plan for aggressive care, esp given his young age.  He has many supportive family and also has young children.   CC time: 40 min.   Montey Hora, [facility] 04/29/2019, 8:16 AM

## 2019-04-29 NOTE — Progress Notes (Signed)
Levophed order was needed last night. After gtt was started, only pulled off net 6mL on CRRT machine for the rest of the night.

## 2019-04-29 NOTE — Progress Notes (Signed)
Progress Note  Patient Name: Carlos Brewer. Date of Encounter: 04/29/2019  Primary Cardiologist: Ida Rogue, MD   Subjective   The mother is at the bedside.  Very difficult clinical situation.  Inpatient Medications    Scheduled Meds: . sodium chloride   Intravenous Once  . chlorhexidine gluconate (MEDLINE KIT)  15 mL Mouth Rinse BID  . Chlorhexidine Gluconate Cloth  6 each Topical Q0600  . feeding supplement (PRO-STAT SUGAR FREE 64)  60 mL Per Tube TID  . fentaNYL (SUBLIMAZE) injection  50 mcg Intravenous Once  . insulin aspart  0-20 Units Subcutaneous Q4H  . insulin aspart  4 Units Subcutaneous Q4H  . insulin detemir  20 Units Subcutaneous Daily  . mouth rinse  15 mL Mouth Rinse 10 times per day  . pantoprazole sodium  40 mg Per Tube Q1200  . sodium chloride flush  10-40 mL Intracatheter Q12H   Continuous Infusions: .  prismasol BGK 4/2.5 500 mL/hr at 04/29/19 1025  .  prismasol BGK 4/2.5 200 mL/hr at 04/28/19 2359  . sodium chloride    . sodium chloride Stopped (04/29/19 1601)  . amiodarone 30 mg/hr (04/29/19 1200)  . dexmedetomidine (PRECEDEX) IV infusion Stopped (04/29/19 0815)  . feeding supplement (VITAL HIGH PROTEIN) 1,000 mL (04/29/19 0945)  . heparin 1,050 Units/hr (04/29/19 1200)  . meropenem (MERREM) IV Stopped (04/29/19 0636)  . norepinephrine (LEVOPHED) Adult infusion 24 mcg/min (04/29/19 1200)  . prismasol BGK 4/2.5 2,000 mL/hr at 04/29/19 1158   PRN Meds: sodium chloride, bisacodyl, docusate, fentaNYL, fentaNYL (SUBLIMAZE) injection, heparin, heparin, labetalol **AND** [DISCONTINUED] niCARDipine, midazolam, sodium chloride flush   Vital Signs    Vitals:   04/29/19 1100 04/29/19 1115 04/29/19 1155 04/29/19 1200  BP: 103/77  124/73 103/77  Pulse: 64 64 67 63  Resp: 20 20 (!) 21 20  Temp:      TempSrc:      SpO2: 100% 99% 100% 100%  Weight:      Height:        Intake/Output Summary (Last 24 hours) at 04/29/2019 1255 Last data  filed at 04/29/2019 1202 Gross per 24 hour  Intake 2878.69 ml  Output 4157 ml  Net -1278.31 ml   Last 3 Weights 04/29/2019 04/28/2019 04/27/2019  Weight (lbs) 267 lb 13.7 oz 269 lb 10 oz 274 lb 14.6 oz  Weight (kg) 121.5 kg 122.3 kg 124.7 kg      Telemetry    Sinus rhythm- Personally Reviewed  ECG    No new tracing 62/11/2019- Personally Reviewed  Physical Exam  Young, intubated, with no spontaneous movement. GEN:  Moderate obesity Neck:  Not easy to evaluate for JVD Cardiac: Difficult due to support normal was. Respiratory: Clear anterior lung fields.  Marland Kitchen GI: Soft, nontender, non-distended  MS: No edema; No deformity. Neuro:  Opens eyes but does not focus or track.   Labs    High Sensitivity Troponin:   Recent Labs  Lab 04/23/19 0945 04/23/19 1122 04/26/19 0158  TROPONINIHS 16,274* 14,226* 2,409*      Chemistry Recent Labs  Lab 04/27/19 0427 04/27/19 1608 04/28/19 0549 04/28/19 1602 04/29/19 0512  NA 136   < > 137 135 136  K 4.8   < > 4.0 4.0 4.0  CL 101   < > 97* 100 99  CO2 21*   < > _0 GLUCOSE 217*   < > 138* 177* 222*  BUN 44*   < > 54* 56* 63*  CREATININE  3.10*   < > 3.11* 3.07* 3.23*  CALCIUM 8.5*   < > 8.7* 8.4* 8.8*  PROT 6.6  --  6.3*  --  6.9  ALBUMIN 2.9*  3.0*   < > 2.8*  2.8* 2.7* 2.9*  2.8*  AST 1,059*  --  402*  --  216*  ALT 3,520*  --  1,909*  --  1,338*  ALKPHOS 128*  --  166*  --  160*  BILITOT 3.2*  --  2.4*  --  1.8*  GFRNONAA 25*   < > 25* 25* 24*  GFRAA 29*   < > 29* 29* 27*  ANIONGAP 14   < > 16* 11 14   < > = values in this interval not displayed.     Hematology Recent Labs  Lab 04/26/19 0158 04/28/19 0549 04/29/19 0512  WBC 20.4* 22.3* 24.0*  RBC 4.11* 4.16* 4.37  HGB 12.7* 12.8* 13.4  HCT 37.2* 39.5 42.2  MCV 90.5 95.0 96.6  MCH 30.9 30.8 30.7  MCHC 34.1 32.4 31.8  RDW 12.3 13.4 14.0  PLT 157 164 172    BNP Recent Labs  Lab 04/23/19 0945  BNP 2,909.9*     DDimer No results for input(s): DDIMER  in the last 168 hours.   Radiology    MR ANGIO HEAD WO CONTRAST  Result Date: 04/28/2019 CLINICAL DATA:  Stroke.  Follow-up tPA.  Left MCA occlusion. EXAM: MRI HEAD WITHOUT CONTRAST MRA HEAD WITHOUT CONTRAST TECHNIQUE: Multiplanar, multiecho pulse sequences of the brain and surrounding structures were obtained without intravenous contrast. Angiographic images of the head were obtained using MRA technique without contrast. COMPARISON:  CT 04/23/2019 FINDINGS: MRI HEAD FINDINGS Brain: Extensive infarction in the right cerebellum. Areas of infarction also affect the right superior cerebellar peduncle and mid brain. 3 cm region of acute infarction in the left insula and frontal operculum ir region. Right cerebral hemisphere shows multiple areas of infarction in the right temporal lobe, insula, frontal operculum ir region and parietal region. Infarction also affects the left posterior basal ganglia and caudate nucleus. Punctate acute infarction at the left frontoparietal vertex. Areas of infarction show mild swelling. Minor petechial blood products are present, particularly in the left frontal operculum ir infarction, but there is no frank hematoma. No hydrocephalus. No midline shift. No extra-axial collection. Vascular: See below. Skull and upper cervical spine: Negative Sinuses/Orbits: No significant sinus disease.  Orbits negative. Other: None MRA HEAD FINDINGS Both internal carotid arteries are patent through the skull base and siphon regions. There is flow within the anterior and middle cerebral vessels. I do not see any large or medium vessel occlusion presently. Dominant right vertebral artery patent to the basilar. Non dominant left vertebral artery serves PICA and then gives a minimal supply to the basilar. Basilar artery is patent. Flow is present within the anterior inferior cerebellar arteries, superior cerebellar arteries and posterior cerebral arteries. Posterior cerebral arteries receive most of  there supply from the anterior circulation, with only minor contributions from the basilar tip. IMPRESSION: Extensive acute infarction in the right cerebellum with swelling and petechial blood products. Acute infarction of the superior cerebellar peduncle on the right, with some infarction in the mid brain. Extensive acute infarction in the right temporal lobe and insula. Small acute infarctions in the frontal operculum region and right parietal region. Infarction affecting the right posterior basal ganglia and caudate nucleus. Petechial blood products present without frank hematoma. Acute infarction in the left insula and frontal operculum. Petechial  blood products present without frank hematoma. Small punctate acute infarctions higher in the left frontoparietal junction region. No large or medium vessel occlusion seen presently. Restoration of flow in the left MCA. Flow appears to be present in posterior circulation main vessels and branch vessels presently. Electronically Signed   By: Nelson Chimes M.D.   On: 04/28/2019 22:19   MR BRAIN WO CONTRAST  Result Date: 04/28/2019 CLINICAL DATA:  Stroke.  Follow-up tPA.  Left MCA occlusion. EXAM: MRI HEAD WITHOUT CONTRAST MRA HEAD WITHOUT CONTRAST TECHNIQUE: Multiplanar, multiecho pulse sequences of the brain and surrounding structures were obtained without intravenous contrast. Angiographic images of the head were obtained using MRA technique without contrast. COMPARISON:  CT 04/23/2019 FINDINGS: MRI HEAD FINDINGS Brain: Extensive infarction in the right cerebellum. Areas of infarction also affect the right superior cerebellar peduncle and mid brain. 3 cm region of acute infarction in the left insula and frontal operculum ir region. Right cerebral hemisphere shows multiple areas of infarction in the right temporal lobe, insula, frontal operculum ir region and parietal region. Infarction also affects the left posterior basal ganglia and caudate nucleus. Punctate acute  infarction at the left frontoparietal vertex. Areas of infarction show mild swelling. Minor petechial blood products are present, particularly in the left frontal operculum ir infarction, but there is no frank hematoma. No hydrocephalus. No midline shift. No extra-axial collection. Vascular: See below. Skull and upper cervical spine: Negative Sinuses/Orbits: No significant sinus disease.  Orbits negative. Other: None MRA HEAD FINDINGS Both internal carotid arteries are patent through the skull base and siphon regions. There is flow within the anterior and middle cerebral vessels. I do not see any large or medium vessel occlusion presently. Dominant right vertebral artery patent to the basilar. Non dominant left vertebral artery serves PICA and then gives a minimal supply to the basilar. Basilar artery is patent. Flow is present within the anterior inferior cerebellar arteries, superior cerebellar arteries and posterior cerebral arteries. Posterior cerebral arteries receive most of there supply from the anterior circulation, with only minor contributions from the basilar tip. IMPRESSION: Extensive acute infarction in the right cerebellum with swelling and petechial blood products. Acute infarction of the superior cerebellar peduncle on the right, with some infarction in the mid brain. Extensive acute infarction in the right temporal lobe and insula. Small acute infarctions in the frontal operculum region and right parietal region. Infarction affecting the right posterior basal ganglia and caudate nucleus. Petechial blood products present without frank hematoma. Acute infarction in the left insula and frontal operculum. Petechial blood products present without frank hematoma. Small punctate acute infarctions higher in the left frontoparietal junction region. No large or medium vessel occlusion seen presently. Restoration of flow in the left MCA. Flow appears to be present in posterior circulation main vessels and  branch vessels presently. Electronically Signed   By: Nelson Chimes M.D.   On: 04/28/2019 22:19   DG Chest Port 1 View  Result Date: 04/29/2019 CLINICAL DATA:  Respiratory failure EXAM: PORTABLE CHEST 1 VIEW COMPARISON:  04/27/2019 FINDINGS: Cardiac shadow is enlarged but stable. Endotracheal tube, gastric catheter and bilateral jugular catheters are again noted and stable. Patchy airspace opacities are again identified bilaterally. The overall appearance is stable from the prior exam. IMPRESSION: Patchy opacities consistent with the given clinical history. Tubes and lines as described. Electronically Signed   By: Inez Catalina M.D.   On: 04/29/2019 08:33    Cardiac Studies   2D Doppler echocardiogram April 25, 2019: IMPRESSIONS  1. Left ventricular ejection fraction, by estimation, is <20%. The left  ventricle has severely decreased function. The left ventricle demonstrates  global hypokinesis. The left ventricular internal cavity size was severely  dilated. Possible small 0.8 x  0.6 cm apical thrombus. Somewhat subtle finding, could confirm with  cardiac MRI. However, if patient has had CVA, would be reasonable to  anticoagulate given severity of LV dysfunction if no contraindications to  anticoagulation.  2. Limited echo.   Patient Profile     34 y.o. male with history of NICM and myocarditis, followed by the advanced CHF service, presented 02/05 with acute MCA stroke treated with tPA and IR thrombectomy, shock secondary to combined sepsis and cardiogenic etiology, apical thrombus noted on 2D Doppler echocardiogram, wide-complex and narrow complex tachycardia requiring amiodarone, acute anuric renal failure requiring dialysis, and persistent significant neurological defects.  Family wants aggressive care.  Assessment & Plan    1. Wide complex and narrow complex tachycardia: The WCT raise concern for ventricular tachycardia.  IV amiodarone was started to control both potential VT  and PSVT.  There is possibility that PSVT with aberration accounted for the [facility] 2. Nonischemic cardiomyopathy with left ventricular apical thrombus (subtle): The patient is on IV heparin anticoagulation 3. Nonischemic cardiomyopathy: Most recent echo as noted above reveals EF less than 20%.  Guideline directed therapy for systolic dysfunction is being held because of low blood pressures and hemodynamic instability. 4. Amiodarone therapy: Started when the patient developed wide-complex tachycardia on 04/26/2019.  Should be able to switch over to oral therapy this weekend.  Plan to continue aggressive support as is the wish of the family.      For questions or updates, please contact [place] Please consult [email or web address] for contact info under        Signed, Sinclair Grooms, MD  04/29/2019, 12:55 PM

## 2019-04-29 NOTE — Progress Notes (Signed)
NAME:  Carlos Deman., MRN:  [id number], DOB:  1985-08-02, LOS: 7 ADMISSION DATE:  05/13/2019, CONSULTATION DATE:  05/05/2019 REFERRING MD:  Dr. Leonel Ramsay, CHIEF COMPLAINT:  Slurred speech  Brief History   34 yo male smoker found to have slurred speech and Rt sided weakness.  CT head showed M2 occlusion.  Ultimately treated with thrombolytic and thrombectomy by IR.  Required intubation for airway protection.    Past Medical History  Systolic CHF with non ischemic CM, Cocaine abuse, OSA, DM Hx of CHF (EF 15%)  Hx myocarditis Smoker 1/2 ppd   Significant Hospital Events   2/05 Admit, tPA, IR thrombectomy 2/6 100% on fvent at 2300  Overnight requiring increasing levophed and phenylephrine.    2/6: switched to levophed, epi, started antibiotics, started on CRRT.  2/9  developed wide-complex tachycardia and hypotension, started on amiodarone drip, increase Levophed drip  Consults:  Neuro IR  Procedures:  ETT 2/05 >>  LIJ CVL 2/5 >> RIJ HD cath 2/6 >>  2/5-2/6:  TPA given at 428 am (total of 90 Mg) 520 am went to IR  S/P Lt common carotid arteriogram followed by complete revascularization of occluded LT MCA sup division mid M2 seg with x 1 pass with 57mx 40 mm solitaire X ret river device and penumbra aspiration with TICI 3 revascularization   Significant Diagnostic Tests:  CT angio head/neck 2/05 >> occlusion of Lt MCA bifurcation Echo 2/05 >> EF less than 20%, cannot rule out apical thrombus Limited echo 2/8 >> Possible small 0.8 x 0.6 cm apical thrombus MRI brain 2/11 > extensive acute infarction of multiple areas without large or medium vessel occlusion  Micro Data:  SARS CoV2 PCR 2/05 >> negative Influenza PCR 2/05 >> negative Urine 2/6 >> ng resp 2/6 >> nml flora BC 2/6 >>ng  Antimicrobials:  mero 2/7 >> vanc 2/6 >> 2/9  Interim history/subjective:   Intermittent agitation with biting of ET tube requiring sedation to be turned back on. Remains  critically ill, intubated, on CRRT Anuric Afebrile  Objective   Blood pressure 116/85, pulse 63, temperature (!) 97.5 F (36.4 C), resp. rate (!) 21, height 6' 1"  (1.854 m), weight 121.5 kg, SpO2 100 %. CVP:  [8 mmHg-31 mmHg] 10 mmHg  Vent Mode: PSV;CPAP FiO2 (%):  [40 %] 40 % Set Rate:  [24 bmp] 24 bmp PEEP:  [5 cmH20] 5 cmH20 Pressure Support:  [15 cmH20] 15 cmH20 Plateau Pressure:  [20 cQPY19-50cmH20] 20 cmH20   Intake/Output Summary (Last 24 hours) at 04/29/2019 09326Last data filed at 04/29/2019 0900 Gross per 24 hour  Intake 2669.24 ml  Output 4264 ml  Net -1594.76 ml   Filed Weights   04/27/19 0424 04/28/19 0500 04/29/19 0500  Weight: 124.7 kg 122.3 kg 121.5 kg    Examination:  General -young obese well-built, no distress Eyes -mild pallor, no icterus,no nystagmus ENT - ETT in place , catheter exit sites no erythema Cardiac -S1-S2 distant, no murmur Chest - no wheeze, bilateral ventilated breath sounds Abdomen - soft, non tender, + bowel sounds Extremities - no cyanosis, clubbing, or edema Skin - no rashes Neuro -eyes open on low-dose Precedex, , does not follow commands, right hemiplegia, moves left arm and leg to painful stimulus   Chest x-ray 2/12 personally reviewed -bilateral airspace disease and effusions Labs show leukocytosis, decreasing LFTs, normal electrolytes   Resolved Hospital Problem list     Assessment & Plan:  Shock, septic + cardiogenic: Back on Levophed  but this is more likely related to agitation/sedation rather than Possibly UTI vs PNA.  Continue meropenem x 7ds total , negative cultures Off steroids   #Cardiogenic shock -excellent coox reassuring LV apical thrombus Non ischemic CM with chronic systolic CHF. 2/9 wide-complex tachycardia - Dx 2018 - followed by [place] in [place] - continue lipitor, digoxin - hold outpt coreg, zaroxolyn, entresto, aldactone, torsemide for now Cardiology following, continue IV amiodarone  -Ct IV heparin for apical thrombus and emboli   Acute hypoxemic respiratory failure: B infiltrates, ARDS vs cardiogenic pulm edema. Oxygenation has improved volume removal with CRRT - LTV ventilation  --Can start spontaneous breathing trials but will not extubate due to mental status  AKI;  Continue CRRT per renal, neg balance as blood pressure permits No signs of renal recovery, DC Foley  L MCA CVA: Likely embolic, s/p tpa 568 AM on 2/5 ,S/p IR clot removal.  MRI 2/11 shows multiple embolic strokes which is likely cause of Acute  encephalopathy:   -Using intermittent fentanyl, goal RASS 0 -Low-dose Precedex okay if blood pressure permits   Transaminitis 2/2 hypoperfusion (shock liver) Coagulopathy - INR improved  (FFP x 2  2/6) , LFTs slight bump 2/9 due to increased hypotension, trending back down again Appreciate GI consult.  Caution with acetaminophen if needed for fever.  Follow LFTs every other day, now decreasing   Hx of cocaine abuse. depression. - not active per UDS  - continue prozac   DM type II poorly controlled. - SSI -Ct 20 units Levemir daily -Ct 4 units NovoLog every 4 hours TF coverage -CBG 1 40-180 acceptable   Summary-intermittent pressor requirement based on agitation and sedation, unfortunately poor mental status is due to multiple strokes and not sure how much improvement to expect here.  Also no signs of renal recovery yet.  We will need to plan for tracheostomy next week  Best practice:  Diet: NPO,  TFs DVT prophylaxis: SCDs GI prophylaxis: protonix Mobility: bed rest Code Status: full code Disposition: ICU Family -fianc [name] updated 2/12 -Per neurology note he designated her as [facility] POA .mother visits daily .some disagreement between them but both agree to plan for aggressive care, esp given his young age.  He has many supportive family and also has young children.    Care was coordinated with other consultants including cardiology and  neurology  The patient is critically ill with multiple organ systems failure and requires high complexity decision making for assessment and support, frequent evaluation and titration of therapies, application of advanced monitoring technologies and extensive interpretation of multiple databases. Critical Care Time devoted to patient care services described in this note independent of APP/resident  time is 35 minutes.     [name] MD. [facility] Flood. [facility]  If no response to pager , please call [phone]     04/29/2019

## 2019-04-29 NOTE — Progress Notes (Signed)
ANTICOAGULATION CONSULT NOTE  Pharmacy Consult for heparin Indication: stroke, LV thrombus   No Known Allergies  Patient Measurements: Height: 6\' 1"  (185.4 cm) Weight: 267 lb 13.7 oz (121.5 kg) IBW/kg (Calculated) : 79.9 Heparin Dosing Weight: 107 kg  Vital Signs: Temp: 97.6 F (36.4 C) (02/12 1434) Temp Source: Axillary (02/12 1200) BP: 111/76 (02/12 1600) Pulse Rate: 76 (02/12 1600)  Labs: Recent Labs    04/28/19 0549 04/28/19 1158 04/28/19 1602 04/28/19 2310 04/29/19 0512 04/29/19 1600  HGB 12.8*  --   --   --  13.4  --   HCT 39.5  --   --   --  42.2  --   PLT 164  --   --   --  172  --   HEPARINUNFRC 0.72*   < >  --  0.55 0.26* 0.19*  CREATININE 3.11*  --  3.07*  --  3.23* 3.44*   < > = values in this interval not displayed.    Estimated Creatinine Clearance: 41.3 mL/min (A) (by C-G formula based on SCr of 3.44 mg/dL (H)).  Assessment: 34 yo m initially presented with aphasia and right sided weaknesss. Found to have left MCA infarct - received TPA and revascularization with IR on 2/5. Now found to have LV apical thrombus on ECHO.  Pharmacy consulted to dose IV heparin.  Heparin level is now sub-therapeutic and trending down.  No issue with heparin infusion and labs have been consistently drawn from the [facility].  No bleeding reported.  Goal of Therapy:  Heparin level 0.3 - 0.5 units/ml Monitor platelets by anticoagulation protocol: Yes   Plan:  Increase heparin infusion to 1250 units/hr Check 8 hr heparin level  Kina Shiffman D. Mina Marble, PharmD, BCPS, [place] 04/29/2019, 4:47 PM

## 2019-04-29 NOTE — Progress Notes (Signed)
Patient ID: Carlos Brewer., male   DOB: [birth year], 34 y.o.   MRN: [id number] [facility] Progress Note   Assessment/ Plan:   1. Acute kidney Injury: Anuric and without evidence of renal recovery (bloody urine in collection bag).  Etiology likely ATN with ischemic/nephrotoxic insults and exacerbated by cardiogenic shock.  Continue current CRRT prescription with efforts at volume unloading with UF. 2.  Shock: Cardiogenic versus septic with data so far favoring the latter (pulmonary versus urinary etiology).  On meropenem and vancomycin.  Off pressors with improving shock markers 3.  Acute CVA from right MCA occlusion: Status post TPA/thrombectomy by IR.  Likely with significant residual neurological injury. 4.  Acute hypoxic respiratory failure: Remains ventilator dependent with efforts at trying to volume unloading with CRRT.  Subjective:   Pressors restarted overnight with adjustment of ultrafiltration goal to 50 cc/h.   Objective:   BP 92/62   Pulse 77   Temp (!) 97.3 F (36.3 C)   Resp (!) 22   Ht 6' 1"  (1.854 m)   Wt 121.5 kg   SpO2 100%   BMI 35.34 kg/m   Intake/Output Summary (Last 24 hours) at 04/29/2019 8841 Last data filed at 04/29/2019 0800 Gross per 24 hour  Intake 2698.61 ml  Output 4218 ml  Net -1519.39 ml   Weight change: -0.8 kg  Physical Exam: Gen: Intubated, attempts to open eyes when calling out his name CVS: Pulse regular rhythm, normal rate, S1 and S2 normal Resp: Anteriorly transmitted breath sounds on ventilator. Abd: Soft, obese, nontender Ext: Trace to 1+ bilateral ankle edema with 1+ presacral edema  Imaging: MR ANGIO HEAD WO CONTRAST  Result Date: 04/28/2019 CLINICAL DATA:  Stroke.  Follow-up tPA.  Left MCA occlusion. EXAM: MRI HEAD WITHOUT CONTRAST MRA HEAD WITHOUT CONTRAST TECHNIQUE: Multiplanar, multiecho pulse sequences of the brain and surrounding structures were obtained without intravenous contrast. Angiographic images  of the head were obtained using MRA technique without contrast. COMPARISON:  CT 04/23/2019 FINDINGS: MRI HEAD FINDINGS Brain: Extensive infarction in the right cerebellum. Areas of infarction also affect the right superior cerebellar peduncle and mid brain. 3 cm region of acute infarction in the left insula and frontal operculum ir region. Right cerebral hemisphere shows multiple areas of infarction in the right temporal lobe, insula, frontal operculum ir region and parietal region. Infarction also affects the left posterior basal ganglia and caudate nucleus. Punctate acute infarction at the left frontoparietal vertex. Areas of infarction show mild swelling. Minor petechial blood products are present, particularly in the left frontal operculum ir infarction, but there is no frank hematoma. No hydrocephalus. No midline shift. No extra-axial collection. Vascular: See below. Skull and upper cervical spine: Negative Sinuses/Orbits: No significant sinus disease.  Orbits negative. Other: None MRA HEAD FINDINGS Both internal carotid arteries are patent through the skull base and siphon regions. There is flow within the anterior and middle cerebral vessels. I do not see any large or medium vessel occlusion presently. Dominant right vertebral artery patent to the basilar. Non dominant left vertebral artery serves PICA and then gives a minimal supply to the basilar. Basilar artery is patent. Flow is present within the anterior inferior cerebellar arteries, superior cerebellar arteries and posterior cerebral arteries. Posterior cerebral arteries receive most of there supply from the anterior circulation, with only minor contributions from the basilar tip. IMPRESSION: Extensive acute infarction in the right cerebellum with swelling and petechial blood products. Acute infarction of the superior cerebellar peduncle on the right,  with some infarction in the mid brain. Extensive acute infarction in the right temporal lobe and  insula. Small acute infarctions in the frontal operculum region and right parietal region. Infarction affecting the right posterior basal ganglia and caudate nucleus. Petechial blood products present without frank hematoma. Acute infarction in the left insula and frontal operculum. Petechial blood products present without frank hematoma. Small punctate acute infarctions higher in the left frontoparietal junction region. No large or medium vessel occlusion seen presently. Restoration of flow in the left MCA. Flow appears to be present in posterior circulation main vessels and branch vessels presently. Electronically Signed   By: Nelson Chimes M.D.   On: 04/28/2019 22:19   MR BRAIN WO CONTRAST  Result Date: 04/28/2019 CLINICAL DATA:  Stroke.  Follow-up tPA.  Left MCA occlusion. EXAM: MRI HEAD WITHOUT CONTRAST MRA HEAD WITHOUT CONTRAST TECHNIQUE: Multiplanar, multiecho pulse sequences of the brain and surrounding structures were obtained without intravenous contrast. Angiographic images of the head were obtained using MRA technique without contrast. COMPARISON:  CT 04/23/2019 FINDINGS: MRI HEAD FINDINGS Brain: Extensive infarction in the right cerebellum. Areas of infarction also affect the right superior cerebellar peduncle and mid brain. 3 cm region of acute infarction in the left insula and frontal operculum ir region. Right cerebral hemisphere shows multiple areas of infarction in the right temporal lobe, insula, frontal operculum ir region and parietal region. Infarction also affects the left posterior basal ganglia and caudate nucleus. Punctate acute infarction at the left frontoparietal vertex. Areas of infarction show mild swelling. Minor petechial blood products are present, particularly in the left frontal operculum ir infarction, but there is no frank hematoma. No hydrocephalus. No midline shift. No extra-axial collection. Vascular: See below. Skull and upper cervical spine: Negative Sinuses/Orbits: No  significant sinus disease.  Orbits negative. Other: None MRA HEAD FINDINGS Both internal carotid arteries are patent through the skull base and siphon regions. There is flow within the anterior and middle cerebral vessels. I do not see any large or medium vessel occlusion presently. Dominant right vertebral artery patent to the basilar. Non dominant left vertebral artery serves PICA and then gives a minimal supply to the basilar. Basilar artery is patent. Flow is present within the anterior inferior cerebellar arteries, superior cerebellar arteries and posterior cerebral arteries. Posterior cerebral arteries receive most of there supply from the anterior circulation, with only minor contributions from the basilar tip. IMPRESSION: Extensive acute infarction in the right cerebellum with swelling and petechial blood products. Acute infarction of the superior cerebellar peduncle on the right, with some infarction in the mid brain. Extensive acute infarction in the right temporal lobe and insula. Small acute infarctions in the frontal operculum region and right parietal region. Infarction affecting the right posterior basal ganglia and caudate nucleus. Petechial blood products present without frank hematoma. Acute infarction in the left insula and frontal operculum. Petechial blood products present without frank hematoma. Small punctate acute infarctions higher in the left frontoparietal junction region. No large or medium vessel occlusion seen presently. Restoration of flow in the left MCA. Flow appears to be present in posterior circulation main vessels and branch vessels presently. Electronically Signed   By: Nelson Chimes M.D.   On: 04/28/2019 22:19    Labs: BMET Recent Labs  Lab 04/26/19 0158 04/26/19 1710 04/27/19 0427 04/27/19 1608 04/28/19 0549 04/28/19 1602 04/29/19 0512  NA 135 136 136 136 137 135 136  K 3.9 4.7 4.8 4.3 4.0 4.0 4.0  CL 96* 99 101 100  97* 100 99  CO2 25 23 21* 23 24 24 23    GLUCOSE 212* 231* 217* 160* 138* 177* 222*  BUN 42* 42* 44* 48* 54* 56* 63*  CREATININE 3.64* 3.18* 3.10* 3.09* 3.11* 3.07* 3.23*  CALCIUM 7.7* 8.2* 8.5* 8.4* 8.7* 8.4* 8.8*  PHOS 4.1 4.0 4.1 3.0 3.3 4.2 4.3   CBC Recent Labs  Lab 04/23/19 0208 04/23/19 0208 04/23/19 0945 04/23/19 1022 04/24/19 0843 04/24/19 0911 04/25/19 0358 04/25/19 0358 04/25/19 2319 04/26/19 0158 04/28/19 0549 04/29/19 0512  WBC 21.9*   < > 16.2*   < > 17.6*   < > 15.6*  --   --  20.4* 22.3* 24.0*  NEUTROABS 19.2*  --  13.5*  --  15.8*  --   --   --   --   --   --   --   HGB 15.4   < > 14.6   < > 13.7   < > 12.5*   < > 11.6* 12.7* 12.8* 13.4  HCT 45.2   < > 44.0   < > 39.8   < > 36.5*   < > 34.0* 37.2* 39.5 42.2  MCV 90.8   < > 92.6   < > 92.3   < > 89.5  --   --  90.5 95.0 96.6  PLT 171   < > 145*   < > 137*   < > 124*  --   --  157 164 172   < > = values in this interval not displayed.    Medications:    . sodium chloride   Intravenous Once  . chlorhexidine gluconate (MEDLINE KIT)  15 mL Mouth Rinse BID  . Chlorhexidine Gluconate Cloth  6 each Topical Q0600  . feeding supplement (PRO-STAT SUGAR FREE 64)  60 mL Per Tube TID  . fentaNYL (SUBLIMAZE) injection  50 mcg Intravenous Once  . insulin aspart  0-20 Units Subcutaneous Q4H  . insulin aspart  4 Units Subcutaneous Q4H  . insulin detemir  20 Units Subcutaneous Daily  . mouth rinse  15 mL Mouth Rinse 10 times per day  . pantoprazole sodium  40 mg Per Tube Q1200  . sodium chloride flush  10-40 mL Intracatheter Q12H   Elmarie Shiley, MD 04/29/2019, 8:32 AM

## 2019-04-30 ENCOUNTER — Inpatient Hospital Stay (HOSPITAL_COMMUNITY): Payer: No Typology Code available for payment source

## 2019-04-30 DIAGNOSIS — I63312 Cerebral infarction due to thrombosis of left middle cerebral artery: Secondary | ICD-10-CM | POA: Diagnosis not present

## 2019-04-30 DIAGNOSIS — J9600 Acute respiratory failure, unspecified whether with hypoxia or hypercapnia: Secondary | ICD-10-CM | POA: Diagnosis not present

## 2019-04-30 DIAGNOSIS — I472 Ventricular tachycardia: Secondary | ICD-10-CM | POA: Diagnosis not present

## 2019-04-30 DIAGNOSIS — R9431 Abnormal electrocardiogram [ECG] [EKG]: Secondary | ICD-10-CM | POA: Diagnosis not present

## 2019-04-30 DIAGNOSIS — I6602 Occlusion and stenosis of left middle cerebral artery: Secondary | ICD-10-CM | POA: Diagnosis not present

## 2019-04-30 DIAGNOSIS — I639 Cerebral infarction, unspecified: Secondary | ICD-10-CM | POA: Diagnosis not present

## 2019-04-30 DIAGNOSIS — R579 Shock, unspecified: Secondary | ICD-10-CM | POA: Diagnosis not present

## 2019-04-30 LAB — CBC
HCT: 41.2 % (ref 39.0–52.0)
Hemoglobin: 13.1 g/dL (ref 13.0–17.0)
MCH: 30.6 pg (ref 26.0–34.0)
MCHC: 31.8 g/dL (ref 30.0–36.0)
MCV: 96.3 fL (ref 80.0–100.0)
Platelets: 118 10*3/uL — ABNORMAL LOW (ref 150–400)
RBC: 4.28 MIL/uL (ref 4.22–5.81)
RDW: 14.6 % (ref 11.5–15.5)
WBC: 19.9 10*3/uL — ABNORMAL HIGH (ref 4.0–10.5)
nRBC: 0.5 % — ABNORMAL HIGH (ref 0.0–0.2)

## 2019-04-30 LAB — COOXEMETRY PANEL
Carboxyhemoglobin: 1.7 % — ABNORMAL HIGH (ref 0.5–1.5)
Methemoglobin: 1 % (ref 0.0–1.5)
O2 Saturation: 93.1 %
Total hemoglobin: 12.2 g/dL (ref 12.0–16.0)

## 2019-04-30 LAB — GLUCOSE, CAPILLARY
Glucose-Capillary: 164 mg/dL — ABNORMAL HIGH (ref 70–99)
Glucose-Capillary: 177 mg/dL — ABNORMAL HIGH (ref 70–99)
Glucose-Capillary: 225 mg/dL — ABNORMAL HIGH (ref 70–99)
Glucose-Capillary: 172 mg/dL — ABNORMAL HIGH (ref 70–99)
Glucose-Capillary: 176 mg/dL — ABNORMAL HIGH (ref 70–99)

## 2019-04-30 LAB — RENAL FUNCTION PANEL
Albumin: 2.8 g/dL — ABNORMAL LOW (ref 3.5–5.0)
Albumin: 2.7 g/dL — ABNORMAL LOW (ref 3.5–5.0)
Anion gap: 14 (ref 5–15)
Anion gap: 12 (ref 5–15)
BUN: 61 mg/dL — ABNORMAL HIGH (ref 6–20)
BUN: 62 mg/dL — ABNORMAL HIGH (ref 6–20)
CO2: 21 mmol/L — ABNORMAL LOW (ref 22–32)
CO2: 22 mmol/L (ref 22–32)
Calcium: 8.9 mg/dL (ref 8.9–10.3)
Calcium: 8.7 mg/dL — ABNORMAL LOW (ref 8.9–10.3)
Chloride: 100 mmol/L (ref 98–111)
Chloride: 102 mmol/L (ref 98–111)
Creatinine, Ser: 3.02 mg/dL — ABNORMAL HIGH (ref 0.61–1.24)
Creatinine, Ser: 3.2 mg/dL — ABNORMAL HIGH (ref 0.61–1.24)
GFR calc Af Amer: 30 mL/min — ABNORMAL LOW (ref >60)
GFR calc Af Amer: 28 mL/min — ABNORMAL LOW (ref >60)
GFR calc non Af Amer: 26 mL/min — ABNORMAL LOW (ref >60)
GFR calc non Af Amer: 24 mL/min — ABNORMAL LOW (ref >60)
Glucose, Bld: 174 mg/dL — ABNORMAL HIGH (ref 70–99)
Glucose, Bld: 178 mg/dL — ABNORMAL HIGH (ref 70–99)
Phosphorus: 5 mg/dL — ABNORMAL HIGH (ref 2.5–4.6)
Phosphorus: 5.2 mg/dL — ABNORMAL HIGH (ref 2.5–4.6)
Potassium: 4 mmol/L (ref 3.5–5.1)
Potassium: 4.4 mmol/L (ref 3.5–5.1)
Sodium: 135 mmol/L (ref 135–145)
Sodium: 136 mmol/L (ref 135–145)

## 2019-04-30 LAB — HEPATIC FUNCTION PANEL
ALT: 897 U/L — ABNORMAL HIGH (ref 0–44)
AST: 129 U/L — ABNORMAL HIGH (ref 15–41)
Albumin: 2.8 g/dL — ABNORMAL LOW (ref 3.5–5.0)
Alkaline Phosphatase: 152 U/L — ABNORMAL HIGH (ref 38–126)
Bilirubin, Direct: 0.5 mg/dL — ABNORMAL HIGH (ref 0.0–0.2)
Indirect Bilirubin: 1.3 mg/dL — ABNORMAL HIGH (ref 0.3–0.9)
Total Bilirubin: 1.8 mg/dL — ABNORMAL HIGH (ref 0.3–1.2)
Total Protein: 7 g/dL (ref 6.5–8.1)

## 2019-04-30 LAB — MAGNESIUM: Magnesium: 2.8 mg/dL — ABNORMAL HIGH (ref 1.7–2.4)

## 2019-04-30 LAB — PHOSPHORUS: Phosphorus: 4.8 mg/dL — ABNORMAL HIGH (ref 2.5–4.6)

## 2019-04-30 LAB — HEPARIN LEVEL (UNFRACTIONATED)
Heparin Unfractionated: 0.3 IU/mL (ref 0.30–0.70)
Heparin Unfractionated: 0.26 IU/mL — ABNORMAL LOW (ref 0.30–0.70)
Heparin Unfractionated: 0.31 IU/mL (ref 0.30–0.70)

## 2019-04-30 MED ORDER — MIDAZOLAM HCL 2 MG/2ML IJ SOLN
1.0000 mg | Freq: Once | INTRAMUSCULAR | Status: AC
  Administered 2019-04-30: 1 mg via INTRAVENOUS

## 2019-04-30 NOTE — Progress Notes (Signed)
Assisted tele visit to patient with family member.  Braya Habermehl Ann, RN  

## 2019-04-30 NOTE — Progress Notes (Signed)
When restarting the CRRT machine, the heparinized saline was not matching the order in the [facility] and prompting an error message when scanned. The pharmacist could not safely confirm that the current bag was ok to use. A representative from materials brought up both types of bags of heparinized saline, and neither matched the MAR.   After speaking with Dr. Mickel Crow, he gave the approval to use the bags of heparinized saline (2,000 units/L), if unable to scan again.

## 2019-04-30 NOTE — Progress Notes (Signed)
Unable to pull blood back from distal port of CVC. Patient needs AM co ox, IV consult placed.

## 2019-04-30 NOTE — Progress Notes (Signed)
eLink Physician-Brief Progress Note Patient Name: Carlos Brewer. DOB: 1986-01-02 MRN: [id number]   Date of Service  04/30/2019  HPI/Events of Note  Patient hadfrequent bigemeny, HRwent up to 140s,is onaAmiodoarone gtt at 30mg /hbut dropped to SR    eICU Interventions  Will ask bedside nurse to send AM labs now.      Intervention Category Major Interventions: Arrhythmia - evaluation and management  Rotha Cassels [place] 04/30/2019, 4:32 AM

## 2019-04-30 NOTE — Progress Notes (Signed)
ANTICOAGULATION CONSULT NOTE  Pharmacy Consult for Heparin Indication: stroke, LV thrombus   No Known Allergies  Patient Measurements: Height: 6\' 1"  (185.4 cm) Weight: 264 lb 5.3 oz (119.9 kg) IBW/kg (Calculated) : 79.9 Heparin Dosing Weight: 107 kg  Vital Signs: Temp: 98.3 F (36.8 C) (02/13 1600) Temp Source: Axillary (02/13 1600) BP: 114/80 (02/13 1700) Pulse Rate: 70 (02/13 1715)  Labs: Recent Labs    04/28/19 0549 04/28/19 1158 04/29/19 0512 04/29/19 0512 04/29/19 1600 04/29/19 1600 04/30/19 0101 04/30/19 0437 04/30/19 1612  HGB 12.8*  --  13.4  --   --   --   --  13.1  --   HCT 39.5  --  42.2  --   --   --   --  41.2  --   PLT 164  --  172  --   --   --   --  118*  --   HEPARINUNFRC 0.72*   < > 0.26*   < > 0.19*   < > 0.30 0.26* 0.31  CREATININE 3.11*   < > 3.23*   < > 3.44*  --   --  3.02* 3.20*   < > = values in this interval not displayed.    Estimated Creatinine Clearance: 44.1 mL/min (A) (by C-G formula based on SCr of 3.2 mg/dL (H)).  Assessment: 34 yo m initially presented with aphasia and right sided weaknesss. Found to have left MCA infarct - received TPA and revascularization with IR on 2/5. Now found to have LV apical thrombus on ECHO.  Pharmacy consulted to dose IV heparin.  Repeat heparin level therapeutic at 0.31.  Goal of Therapy:  Heparin level 0.3 - 0.5 units/ml Monitor platelets by anticoagulation protocol: Yes   Plan:  -Continue heparin 1300 units/h -Daily heparin level and CBC   Arrie Senate, PharmD, BCPS Clinical Pharmacist [phone] Please check AMION for all [place] numbers 04/30/2019

## 2019-04-30 NOTE — Progress Notes (Signed)
ANTICOAGULATION CONSULT NOTE  Pharmacy Consult for Heparin Indication: stroke, LV thrombus   No Known Allergies  Patient Measurements: Height: 6\' 1"  (185.4 cm) Weight: 267 lb 13.7 oz (121.5 kg) IBW/kg (Calculated) : 79.9 Heparin Dosing Weight: 107 kg  Vital Signs: Temp: 97.5 F (36.4 C) (02/13 0000) Temp Source: Oral (02/13 0000) BP: 103/75 (02/13 0100) Pulse Rate: 64 (02/13 0145)  Labs: Recent Labs    04/28/19 0549 04/28/19 1158 04/28/19 1602 04/28/19 2310 04/29/19 0512 04/29/19 1600 04/30/19 0101  HGB 12.8*  --   --   --  13.4  --   --   HCT 39.5  --   --   --  42.2  --   --   PLT 164  --   --   --  172  --   --   HEPARINUNFRC 0.72*   < >  --    < > 0.26* 0.19* 0.30  CREATININE 3.11*  --  3.07*  --  3.23* 3.44*  --    < > = values in this interval not displayed.    Estimated Creatinine Clearance: 41.3 mL/min (A) (by C-G formula based on SCr of 3.44 mg/dL (H)).  Assessment: 34 yo m initially presented with aphasia and right sided weaknesss. Found to have left MCA infarct - received TPA and revascularization with IR on 2/5. Now found to have LV apical thrombus on ECHO.  Pharmacy consulted to dose IV heparin.  Heparin level therapeutic x 1 after rate increase  Goal of Therapy:  Heparin level 0.3 - 0.5 units/ml Monitor platelets by anticoagulation protocol: Yes   Plan:  Cont heparin at 1250 units/hr Confirmatory heparin level with AM labs  Narda Bonds, PharmD, [place] Pharmacist Phone: [phone]

## 2019-04-30 NOTE — Progress Notes (Signed)
Assisted tele visit to patient with family member.  Keneshia Tena Ann, RN  

## 2019-04-30 NOTE — Progress Notes (Addendum)
STROKE TEAM PROGRESS NOTE   INTERVAL HISTORY Heavily sedated and intubated due biting down a lot on tube. Vent weaned down yesterday but not following commands. Case discussed with family on video.      OBJECTIVE Vitals:   04/30/19 0515 04/30/19 0530 04/30/19 0545 04/30/19 0600  BP:    113/89  Pulse: 62 61 62 62  Resp: (!) 22 (!) 24 (!) 22 (!) 24  Temp:      TempSrc:      SpO2: 100% 100% 100% 100%  Weight:      Height:        CBC:  Recent Labs  Lab 04/23/19 0945 04/23/19 1022 04/24/19 0843 04/24/19 0911 04/29/19 0512 04/30/19 0437  WBC 16.2*   < > 17.6*   < > 24.0* 19.9*  NEUTROABS 13.5*  --  15.8*  --   --   --   HGB 14.6   < > 13.7   < > 13.4 13.1  HCT 44.0   < > 39.8   < > 42.2 41.2  MCV 92.6   < > 92.3   < > 96.6 96.3  PLT 145*   < > 137*   < > 172 118*   < > = values in this interval not displayed.    Basic Metabolic Panel:  Recent Labs  Lab 04/29/19 0512 04/29/19 0512 04/29/19 1600 04/30/19 0437  NA 136   < > 136 135  K 4.0   < > 4.0 4.0  CL 99   < > 101 100  CO2 23   < > 22 21*  GLUCOSE 222*   < > 169* 174*  BUN 63*   < > 66* 61*  CREATININE 3.23*   < > 3.44* 3.02*  CALCIUM 8.8*   < > 8.4* 8.9  MG 2.7*  --   --  2.8*  PHOS 4.3   < > 5.0* 4.8*  5.0*   < > = values in this interval not displayed.   Lipid Panel:     Component Value Date/Time   CHOL 265 (H) 04/23/2019 0327   TRIG 191 (H) 04/23/2019 0327   HDL 24 (L) 04/23/2019 0327   CHOLHDL 11.0 04/23/2019 0327   VLDL 38 04/23/2019 0327   LDLCALC 203 (H) 04/23/2019 0327   HgbA1c:  Lab Results  Component Value Date   HGBA1C 6.5 (H) 04/23/2019   Urine Drug Screen:     Component Value Date/Time   LABOPIA NONE DETECTED 04/23/2019 0930   COCAINSCRNUR NONE DETECTED 04/23/2019 0930   COCAINSCRNUR NONE DETECTED 02/04/2018 1010   LABBENZ POSITIVE (A) 04/23/2019 0930   AMPHETMU NONE DETECTED 04/23/2019 0930   THCU NONE DETECTED 04/23/2019 0930   LABBARB NONE DETECTED 04/23/2019 0930     Alcohol Level     Component Value Date/Time   ETH <10 05/06/2019 0355    IMAGING CT HEAD CODE STROKE WO CONTRAST 04/21/2019 1. Asymmetric hyperdensity of a Left MCA branch in the Sylvian fissure suspicious for emergent large vessel occlusion in the setting.  2. No associated acute cortically based infarct, ASPECTS 10. No acute hemorrhage.  3. Chronic right frontal operculum encephalomalacia.   CT Code Stroke CTA Head W/WO contrast CT Code Stroke CTA Neck W/WO contrast 04/24/2019 1. Positive for emergent large vessel occlusion at the Left MCA bifurcation. There is reconstitution of the dominant posterior division branch.  2. Elsewhere negative CTA head and neck. Dominant right vertebral artery which supplies the basilar. Fetal type PCA origins.  CT CEREBRAL PERFUSION W CONTRAST 04/26/2019 1. CTP detects evidence of Left MCA territory oligemia concordant with the CTA findings.  2. No infarct core detected with standard CBF <30%.  3. 60 mL of penumbra detected with standard T-max > 6s.   CT HEAD WO CONTRAST 04/23/2019 Small region of loss of gray-white differentiation in the left frontal operculum consistent with a fairly small area of acute infarction compared to the previous perfusion abnormality. No hemorrhage or mass effect. Areas of low-density in the cerebellum that I favor are artifactual. There did not seem to be any abnormalities in that region on the previous examinations.   MRI / MRA Head WO Contrast  04/28/2019 Extensive acute infarction in the right cerebellum with swelling and petechial blood products. Acute infarction of the superior cerebellar peduncle on the right, with some infarction in the mid brain. Extensive acute infarction in the right temporal lobe and insula. Small acute infarctions in the frontal operculum region and right parietal region. Infarction affecting the right posterior basal ganglia and caudate nucleus. Petechial blood products present without frank  hematoma. Acute infarction in the left insula and frontal operculum. Petechial blood products present without frank hematoma. Small punctate acute infarctions higher in the left frontoparietal junction region. No large or medium vessel occlusion seen presently. Restoration of flow in the left MCA. Flow appears to be present in posterior circulation main vessels and branch vessels presently.   DG CHEST PORT 1 VIEW 04/29/2019 Patchy opacities consistent with the given clinical history. Tubes and lines as described.  04/27/2019 Perhaps slightly improved aeration at the left lung base. Otherwise no change in the bilateral patchy airspace and interstitial changes, likely associated with small effusions. Lines and tubes unchanged. 04/26/2019 0854 1. Lines and tubes as described above. 2. Cardiomegaly with basilar airspace disease, likely associated with effusions. 3. Interstitial and alveolar opacities elsewhere may represent a combination of infection and edema and are little changed. 04/25/2019 0946 Overall improved aeration from the prior exam although persistent opacities and small effusions are noted. 04/23/19 1636 1. Interval placement of a right neck vascular sheath, tip positioned over the right brachiocephalic vein. 2.  Other support apparatus in unchanged position. 3. Slight interval increase in diffuse bilateral interstitial and heterogeneous airspace opacity, consistent with multifocal infection, edema, and/or ARDS. 4. Cardiomegaly. 04/23/2019 0757 Increasing patchy bilateral airspace opacities which may represent edema or infection. 04/19/2019 1958 1. Interval placement of neck vascular catheter, tip positioned near the superior cavoatrial junction.  2. Interval retraction of endotracheal tube, now positioned just above the thoracic inlet, approximately 6.5 cm above the carina.  3. Interval placement of esophagogastric tube, tip and side port below the diaphragm. 4. Stable cardiomegaly and diffuse  bilateral interstitial airspace opacity, likely edema.  04/29/2019 0841 1. Endotracheal tube tip noted just above the carina. Proximal repositioning of 2-3 should be considered. 2. Cardiomegaly again noted. Diffuse bilateral pulmonary infiltrates/edema. Small left pleural effusion.   ECHOCARDIOGRAM COMPLETE 05/09/2019 1. Left ventricular ejection fraction, by visual estimation, is <20%. The left ventricle has severely decreased function. There is no left ventricular hypertrophy.   2. Apex is not well-visualized. In setting of stroke with severe LV systolic dysfunction, recommend repeat TTE with contrast to rule out LV apical thrombus   3. Severely dilated left ventricular internal cavity size.   4. Left ventricular diastolic parameters are consistent with Grade III diastolic dysfunction (restrictive).   5. Elevated left atrial pressure.   6. The left ventricle demonstrates global hypokinesis.  7. Global right ventricle has normal systolic function.The right ventricular size is mildly enlarged.   8. Left atrial size was moderately dilated.   9. Right atrial size was normal.  10. The mitral valve is normal in structure. Mild mitral valve regurgitation.  11. The tricuspid valve is normal in structure. Tricuspid valve regurgitation is trivial.  12. The aortic valve is normal in structure. Aortic valve regurgitation is not visualized. No evidence of aortic valve sclerosis or stenosis.  13. The pulmonic valve was not well visualized. Pulmonic valve regurgitation is not visualized.   Interventional Neuro Radiology - Cerebral Angiogram with Intervention 05/04/2019 S/P Lt common carotid arteriogram followed by complete revascularization of occluded LT MCA sup division mid M2 seg with x 1 pass with 85mx 40 mm solitaire X ret river device and penumbra aspiration with TICI 3 revascularization  ECG - SR rate 93 BPM. (See cardiology reading for complete details)  PHYSICAL EXAM   Temp:  [96.6 F (35.9  C)-98.3 F (36.8 C)] 97.5 F (36.4 C) (02/13 0400) Pulse Rate:  [29-253] 62 (02/13 0600) Resp:  [17-30] 24 (02/13 0600) BP: (92-133)/(62-92) 113/89 (02/13 0600) SpO2:  [87 %-100 %] 100 % (02/13 0600) Arterial Line BP: (93-177)/(55-101) 115/73 (02/13 0600) FiO2 (%):  [40 %] 40 % (02/13 0425) Weight:  [119.9 kg] 119.9 kg (02/13 0400)  General - Well nourished, well developed, intubated off sedation.  Ophthalmologic - fundi not visualized due to noncooperation.  Cardiovascular - Regular rhythm but tachycardia.  Neuro - intubated off sedation and off pressors on weaning; No eye opening to pain at this - on significant sedation; not consistently blinking to visual threat bilaterally, PERRL. Corneal reflex present, gag and cough present. Breathing over the vent.  Facial symmetry not able to test due to ET tube.  Tongue protrusion not cooperative. On pain stimulation, no movement of all limbs. DTR 1+ and bilateral positive babinski. Sensation, coordination and gait not tested.   MRI reviewed in person shows moderate bilateral frontal infarct L more than R with petechial hemorrhage on SWI. Also large R cerebellar infarct.   ASSESSMENT/PLAN [name] is a 34y.o. male with history of NICM EF 15-20%, CHF, DM, Obesity, tobacco use, OSA, and hx of cocaine use presenting with aphasia and right sided weakness. The patient received IV t-PA on Friday 04/27/2019 at 4:45 AM. Mechanical thrombectomy left M2.  Stroke:  Extensive R cerebellar and R cerebral infarcts and L MCA infarcts s/p tPA & IR with left M2 TICI3 reperfusion - embolic - likely secondary to severe cardiomyopathy with low EF and LV thrombus  Code Stroke CT Head - left MCA hyperdensity.   CTA H&N - emergent large vessel occlusion at the Left MCA bifurcation.   CT Perfusion - 60 mL of penumbra detected.   CT head repeat - Small region of loss of gray-white differentiation in the left frontal operculum   MRI head -  04/28/2019 - extensive R cerebellar infarct w/ petechial hemorrhage. R cerebellar peduncle and midbrain infarct. Extensive R temporal lobe and insula infarct w/ R frontal operculum and parietal infarcts. R posterior basal ganglia and caudate head infarct w/ petechial hemorrhage. L insula and L frontal operculum infarcts w/ petechial hemorrhage. Small L frontoparietal jxn infarcts.   MRA head restoration L MCA flow. Flow in posterior circulation.   2D Echo - EF < 20%   2D with contrast - possible LV apical thrombus 0.8 x 0.6  Sars Corona Virus 2 - negative  LDL - 203  HgbA1c - 6.5  VTE prophylaxis - heparin IV  aspirin 81 mg daily prior to admission, now on heparin IV per stroke protocol.  Therapy recommendations:  pending  Disposition:  Pending  Encephalopathy  Eyes spontaneous open yesterday  Today eyes closed but rolling horizontally  Not following commands or moving extremities  MRI extensive R and L brain and R cerebellar infarcts  Prognosis is poor, concerning for vegetative state vs minimally conscious state  Mom still hopeful, will need ongoing [place] discussion  Acute Hypoxemic Respiratory failure  Intubated  Off sedation now   CCM on board  CXR - consistent with multifocal infection, edema, and/or ARDS (unchanged)  Prn precedex as BP will allow  Cardiogenic  Fever, TMax 103.2->97.5->99->99.1->afebrile->100->97.5  Leukocytosis, WBCs 21->15.6->20.4->22.3->24.0->19.9  CXR - consistent with multifocal infection, edema, and/or ARDS (unchanged) CXR - pending Saturday  On Meropenum and Vancomycin 04/23/19>>antibiotics completed  On hydrocortisone->off  U/A neg, Urine culture no growth   Blood cultures neg   Off pressors  LV thrombus Severe nonischemic cardiomyopathy Chronic systolic CHF  2D Echo - EF < 20%   2D with contrast - possible LV apical thrombus 0.8 x 0.6  On heparin IV  On amiodarone gtt for wide-complex tachycardia and hypotension.  Card plans to change to enteral form over next few days  Cardiology on board  V-tach  Elevated troponin  On amiodarone drip  Troponin trending down 16274->14226->2409   Cardiology on board  May consider cardioversion if needed  E Link 4:30 AM - Patient hadfrequent bigemeny, HRwent up to 140s,is onaAmiodoarone gtt at 29m/hbut dropped to SR   AKI  Creatinine - 1.10->2.64->5.17->4.09->3.91->3.64->3.18->3.11->3.07->3.23->3.02  BUN 44->48->54->56->63->61  Renal consult 04/23/19  Still minimal urinary output, no signs of renal recovery  On CRRT  Hypotension w/ shock Hx Hypertension  Home BP meds: Coreg, Entresto  Now off pressors  SBP goal  >110  Stable  Shock liver d/t hypoperfusion, improving  GI on board  AST/ALT - >10000/10132 ->2846/4801->1059/3520->402/1909->216/1338  INR 3.1->1.8->1.2  CK 602  Hyperlipidemia  Home Lipid lowering medication: Lipitor 40 mg daily  LDL 203, goal < 70  Current lipid lowering medication: Lipitor 80 mg daily   Continue statin at discharge  Diabetes type II, uncontrolled  Home diabetic meds: insulin  Current diabetic meds: SS insulin, Levemir 20, novolog 4u q6h  HgbA1c 6.5, goal < 7.0  CBG monitoring   SSI  Dysphagia . Secondary to stroke . NPO . On tube feeding @ 50 . Speech on board  Tobacco abuse  Current smoker  Mom confirmed tobacco use  Smoking cessation counseling will be provided  Other Stroke Risk Factors  ETOH use, advised to drink no more than 1 alcoholic beverage per day.  Hx cocaine abuse, UDS neg   Obesity, Body mass index is 34.87 kg/m., recommend weight loss, diet and exercise as appropriate   Obstructive sleep apnea - uses Cpap  Substance Abuse Hx of cocaine use  Other Active Problems  Code status - Full code  Hyperkalemia - 70.2->...->4.0->9.7->3.5->3.2->9.9->2.4->2.6 Metabolic acidosis, resolved  Depression on prozac  Mild Thrombocytopenia -  118  Hypermagnesemia - 2.8  Hyperphosphatemia - 4.8/5.0   Hospital day # 8  This patient is critically ill and at significant risk of neurological worsening, death and care requires constant monitoring of vital signs, hemodynamics,respiratory and cardiac monitoring, extensive review of multiple databases, frequent neurological assessment, discussion with family, other specialists and medical decision making of high complexity. I spent 35 minutes of  neurocritical care time  in the care of  this patient. I had long discussion with mom at bedside, updated pt current condition, treatment plan and potential prognosis, and answered all the questions. She expressed understanding and appreciation.      To contact Stroke Continuity provider, please refer to [email or web address] After hours, contact General Neurology

## 2019-04-30 NOTE — Progress Notes (Signed)
Patient ID: Carlos Housman., male   DOB: 14-Dec-1985, 34 y.o.   MRN: [id number] [facility] Progress Note   Assessment/ Plan:   1. Acute kidney Injury: Anuric and without evidence of renal recovery (bloody urine in collection bag).  Etiology likely ATN with ischemic/nephrotoxic insults and exacerbated by cardiogenic shock.  Continue CRRT at this time with efforts at ultrafiltration.  Overall prognosis appears poor and I suspect that he will have long-term dialysis dependency.  Unable to transition to IHD at this point.  Foley catheter discontinued with daily bladder scan planned-32 cc this a.m. 2.  Shock: Cardiogenic versus septic with data so far favoring the latter (pulmonary versus urinary etiology).  On meropenem and vancomycin and Levophed while on CRRT. 3.  Acute CVA from right MCA occlusion: Status post TPA/thrombectomy by IR.  Likely with significant residual neurological injury.  Poor prognosis 4.  Acute hypoxic respiratory failure: Remains ventilator dependent with efforts at trying to volume unloading with CRRT.  Subjective:   Remains on pressors overnight and tolerating UF on CRRT.  Seen earlier today by Dr. Rayann Heman of EP for ventricular tachycardia-limited management options currently restricted to amiodarone.   Objective:   BP 116/90   Pulse 61   Temp (!) 96.7 F (35.9 C) (Axillary)   Resp 17   Ht 6' 1"  (1.854 m)   Wt 119.9 kg   SpO2 100%   BMI 34.87 kg/m   Intake/Output Summary (Last 24 hours) at 04/30/2019 1030 Last data filed at 04/30/2019 1014 Gross per 24 hour  Intake 3291.73 ml  Output 4265 ml  Net -973.27 ml   Weight change: -1.6 kg  Physical Exam: Gen: Intubated, unresponsive to calling out his voice.  Mother at bedside CVS: Pulse regular rhythm, normal rate, S1 and S2 normal Resp: Transmitted ventilator breath sounds bilaterally. Abd: Soft, obese, nontender Ext: 1+ bilateral lower extremity and dependent edema  Imaging: MR ANGIO HEAD  WO CONTRAST  Result Date: 04/28/2019 CLINICAL DATA:  Stroke.  Follow-up tPA.  Left MCA occlusion. EXAM: MRI HEAD WITHOUT CONTRAST MRA HEAD WITHOUT CONTRAST TECHNIQUE: Multiplanar, multiecho pulse sequences of the brain and surrounding structures were obtained without intravenous contrast. Angiographic images of the head were obtained using MRA technique without contrast. COMPARISON:  CT 04/23/2019 FINDINGS: MRI HEAD FINDINGS Brain: Extensive infarction in the right cerebellum. Areas of infarction also affect the right superior cerebellar peduncle and mid brain. 3 cm region of acute infarction in the left insula and frontal operculum ir region. Right cerebral hemisphere shows multiple areas of infarction in the right temporal lobe, insula, frontal operculum ir region and parietal region. Infarction also affects the left posterior basal ganglia and caudate nucleus. Punctate acute infarction at the left frontoparietal vertex. Areas of infarction show mild swelling. Minor petechial blood products are present, particularly in the left frontal operculum ir infarction, but there is no frank hematoma. No hydrocephalus. No midline shift. No extra-axial collection. Vascular: See below. Skull and upper cervical spine: Negative Sinuses/Orbits: No significant sinus disease.  Orbits negative. Other: None MRA HEAD FINDINGS Both internal carotid arteries are patent through the skull base and siphon regions. There is flow within the anterior and middle cerebral vessels. I do not see any large or medium vessel occlusion presently. Dominant right vertebral artery patent to the basilar. Non dominant left vertebral artery serves PICA and then gives a minimal supply to the basilar. Basilar artery is patent. Flow is present within the anterior inferior cerebellar arteries, superior cerebellar arteries  and posterior cerebral arteries. Posterior cerebral arteries receive most of there supply from the anterior circulation, with only minor  contributions from the basilar tip. IMPRESSION: Extensive acute infarction in the right cerebellum with swelling and petechial blood products. Acute infarction of the superior cerebellar peduncle on the right, with some infarction in the mid brain. Extensive acute infarction in the right temporal lobe and insula. Small acute infarctions in the frontal operculum region and right parietal region. Infarction affecting the right posterior basal ganglia and caudate nucleus. Petechial blood products present without frank hematoma. Acute infarction in the left insula and frontal operculum. Petechial blood products present without frank hematoma. Small punctate acute infarctions higher in the left frontoparietal junction region. No large or medium vessel occlusion seen presently. Restoration of flow in the left MCA. Flow appears to be present in posterior circulation main vessels and branch vessels presently. Electronically Signed   By: Nelson Chimes M.D.   On: 04/28/2019 22:19   MR BRAIN WO CONTRAST  Result Date: 04/28/2019 CLINICAL DATA:  Stroke.  Follow-up tPA.  Left MCA occlusion. EXAM: MRI HEAD WITHOUT CONTRAST MRA HEAD WITHOUT CONTRAST TECHNIQUE: Multiplanar, multiecho pulse sequences of the brain and surrounding structures were obtained without intravenous contrast. Angiographic images of the head were obtained using MRA technique without contrast. COMPARISON:  CT 04/23/2019 FINDINGS: MRI HEAD FINDINGS Brain: Extensive infarction in the right cerebellum. Areas of infarction also affect the right superior cerebellar peduncle and mid brain. 3 cm region of acute infarction in the left insula and frontal operculum ir region. Right cerebral hemisphere shows multiple areas of infarction in the right temporal lobe, insula, frontal operculum ir region and parietal region. Infarction also affects the left posterior basal ganglia and caudate nucleus. Punctate acute infarction at the left frontoparietal vertex. Areas of  infarction show mild swelling. Minor petechial blood products are present, particularly in the left frontal operculum ir infarction, but there is no frank hematoma. No hydrocephalus. No midline shift. No extra-axial collection. Vascular: See below. Skull and upper cervical spine: Negative Sinuses/Orbits: No significant sinus disease.  Orbits negative. Other: None MRA HEAD FINDINGS Both internal carotid arteries are patent through the skull base and siphon regions. There is flow within the anterior and middle cerebral vessels. I do not see any large or medium vessel occlusion presently. Dominant right vertebral artery patent to the basilar. Non dominant left vertebral artery serves PICA and then gives a minimal supply to the basilar. Basilar artery is patent. Flow is present within the anterior inferior cerebellar arteries, superior cerebellar arteries and posterior cerebral arteries. Posterior cerebral arteries receive most of there supply from the anterior circulation, with only minor contributions from the basilar tip. IMPRESSION: Extensive acute infarction in the right cerebellum with swelling and petechial blood products. Acute infarction of the superior cerebellar peduncle on the right, with some infarction in the mid brain. Extensive acute infarction in the right temporal lobe and insula. Small acute infarctions in the frontal operculum region and right parietal region. Infarction affecting the right posterior basal ganglia and caudate nucleus. Petechial blood products present without frank hematoma. Acute infarction in the left insula and frontal operculum. Petechial blood products present without frank hematoma. Small punctate acute infarctions higher in the left frontoparietal junction region. No large or medium vessel occlusion seen presently. Restoration of flow in the left MCA. Flow appears to be present in posterior circulation main vessels and branch vessels presently. Electronically Signed   By: Nelson Chimes M.D.   On:  04/28/2019 22:19   DG Chest Port 1 View  Result Date: 04/30/2019 CLINICAL DATA:  Respiratory failure. EXAM: PORTABLE CHEST 1 VIEW COMPARISON:  04/29/2019 FINDINGS: Endotracheal tube terminates at the clavicles, unchanged. Right jugular catheter terminates in the region of the right brachiocephalic vein, unchanged. Left jugular catheter terminates over the upper SVC, also unchanged. Enteric tube courses into the abdomen with tip not imaged. The cardiac silhouette remains enlarged. Patchy bilateral airspace opacities have decreased. No large pleural effusion or pneumothorax is identified. IMPRESSION: Improvement of bilateral airspace disease. Electronically Signed   By: Logan Bores M.D.   On: 04/30/2019 08:27   DG Chest Port 1 View  Result Date: 04/29/2019 CLINICAL DATA:  Respiratory failure EXAM: PORTABLE CHEST 1 VIEW COMPARISON:  04/27/2019 FINDINGS: Cardiac shadow is enlarged but stable. Endotracheal tube, gastric catheter and bilateral jugular catheters are again noted and stable. Patchy airspace opacities are again identified bilaterally. The overall appearance is stable from the prior exam. IMPRESSION: Patchy opacities consistent with the given clinical history. Tubes and lines as described. Electronically Signed   By: Inez Catalina M.D.   On: 04/29/2019 08:33    Labs: BMET Recent Labs  Lab 04/27/19 0427 04/27/19 1608 04/28/19 0549 04/28/19 1602 04/29/19 0512 04/29/19 1600 04/30/19 0437  NA 136 136 137 135 136 136 135  K 4.8 4.3 4.0 4.0 4.0 4.0 4.0  CL 101 100 97* 100 99 101 100  CO2 21* 23 24 24 23 22  21*  GLUCOSE 217* 160* 138* 177* 222* 169* 174*  BUN 44* 48* 54* 56* 63* 66* 61*  CREATININE 3.10* 3.09* 3.11* 3.07* 3.23* 3.44* 3.02*  CALCIUM 8.5* 8.4* 8.7* 8.4* 8.8* 8.4* 8.9  PHOS 4.1 3.0 3.3 4.2 4.3 5.0* 4.8*  5.0*   CBC Recent Labs  Lab 04/24/19 0843 04/24/19 0911 04/26/19 0158 04/28/19 0549 04/29/19 0512 04/30/19 0437  WBC 17.6*   < > 20.4* 22.3*  24.0* 19.9*  NEUTROABS 15.8*  --   --   --   --   --   HGB 13.7   < > 12.7* 12.8* 13.4 13.1  HCT 39.8   < > 37.2* 39.5 42.2 41.2  MCV 92.3   < > 90.5 95.0 96.6 96.3  PLT 137*   < > 157 164 172 118*   < > = values in this interval not displayed.    Medications:    . sodium chloride   Intravenous Once  . chlorhexidine gluconate (MEDLINE KIT)  15 mL Mouth Rinse BID  . Chlorhexidine Gluconate Cloth  6 each Topical Q0600  . feeding supplement (PRO-STAT SUGAR FREE 64)  60 mL Per Tube TID  . fentaNYL (SUBLIMAZE) injection  50 mcg Intravenous Once  . insulin aspart  0-20 Units Subcutaneous Q4H  . insulin aspart  4 Units Subcutaneous Q4H  . insulin detemir  20 Units Subcutaneous Daily  . mouth rinse  15 mL Mouth Rinse 10 times per day  . pantoprazole sodium  40 mg Per Tube Q1200  . sodium chloride flush  10-40 mL Intracatheter Q12H   Elmarie Shiley, MD 04/30/2019, 10:30 AM

## 2019-04-30 NOTE — Progress Notes (Signed)
CT because is stroke may be much larger initially: 1 1 stepdown vasopressin drip  NAME:  Carlos Vest., MRN:  [id number], DOB:  1986/02/06, LOS: 8 ADMISSION DATE:  04/18/2019, CONSULTATION DATE:  04/20/2019 REFERRING MD:  Dr. Leonel Ramsay, CHIEF COMPLAINT:  Slurred speech  Brief History   34 yo male smoker found to have slurred speech and Rt sided weakness.  CT head showed M2 occlusion.  Ultimately treated with thrombolytic and thrombectomy by IR.  Required intubation for airway protection.  Past Medical History  Systolic CHF with non ischemic CM, Cocaine abuse, OSA, DM Hx of CHF (EF 15%)  Hx myocarditis Smoker 1/2 ppd   Significant Hospital Events   2/05 Admit, tPA, IR thrombectomy 2/6 100% on fvent at 2300  Overnight requiring increasing levophed and phenylephrine.    2/6: switched to levophed, epi, started antibiotics, started on CRRT.  2/9  developed wide-complex tachycardia and hypotension, started on amiodarone drip, increase Levophed drip  Consults:  Neuro IR  Procedures:  ETT 2/05 >>  [facility] CVL 2/5 >> [facility] HD cath 2/6 >>  2/5-2/6:  TPA given at 428 am (total of 90 Mg) 520 am went to IR  S/P Lt common carotid arteriogram followed by complete revascularization of occluded LT MCA sup division mid M2 seg with x 1 pass with 81mx 40 mm solitaire X ret river device and penumbra aspiration with TICI 3 revascularization   Significant Diagnostic Tests:  CT angio head/neck 2/05 >> occlusion of Lt MCA bifurcation Echo 2/05 >> EF less than 20%, cannot rule out apical thrombus Limited echo 2/8 >> Possible small 0.8 x 0.6 cm apical thrombus MRI brain 2/11 > extensive acute infarction of multiple areas without large or medium vessel occlusion  Micro Data:  SARS CoV2 PCR 2/05 >> negative Influenza PCR 2/05 >> negative Urine 2/6 >> ng resp 2/6 >> nml flora BC 2/6 >>ng  Antimicrobials:  mero 2/7 >> vanc 2/6 >> 2/9  Interim history/subjective:   Intermittent agitation  with biting of ET tube requiring sedation to be turned back on. Remains critically ill, intubated, Anuric-on CRRT Afebrile  Objective   Blood pressure (!) 128/93, pulse 64, temperature (!) 96.7 F (35.9 C), temperature source Axillary, resp. rate 18, height 6' 1"  (1.854 m), weight 119.9 kg, SpO2 100 %. CVP:  [6 mmHg-20 mmHg] 16 mmHg  Vent Mode: CPAP;PSV FiO2 (%):  [40 %] 40 % Set Rate:  [24 bmp] 24 bmp PEEP:  [5 cmH20] 5 cmH20 Pressure Support:  [10 cmH20-15 cmH20] 10 cmH20 Plateau Pressure:  [16 cmH20-21 cmH20] 19 cmH20   Intake/Output Summary (Last 24 hours) at 04/30/2019 1202 Last data filed at 04/30/2019 1100 Gross per 24 hour  Intake 3164.98 ml  Output 4141 ml  Net -976.02 ml   Filed Weights   04/28/19 0500 04/29/19 0500 04/30/19 0400  Weight: 122.3 kg 121.5 kg 119.9 kg    Examination:  General -young obese well-built, no distress Eyes -pupils reactive ENT -ET tube in place Cardiac -S1-S2 distant, no murmur Chest -  clear breath sounds Abdomen - soft, non tender, + bowel sounds Extremities - no cyanosis, clubbing, or edema Skin - no rashes Neuro -not responsive to voice, not opening eyes   Chest x-ray 2/12-reviewed Labs reviewed   Resolved Hospital Problem list     Assessment & Plan:  Shock Septic, cardiogenic -On pressors -UTI versus pneumonia -7 days of meropenem -Off steroids  Cardiogenic shock Left ventricle apical thrombus Nonischemic cardiomyopathy with systolic congestive heart failure  Wide-complex tachycardia on 2/9 -Continue Lipitor, digoxin -Hold Coreg, hold Zaroxolyn, hold Entresto, hold Aldactone, hold torsemide  -cardiology following -Continue amiodarone -Continue anticoagulation with heparin  Acute hypoxemic respiratory failure -Continue ventilator support -Not a candidate for extubation secondary to altered mentation of present  Acute kidney injury -Continue CRRT -Appreciate renal input    Left MCA CVA Likely embolic  Encephalopathy -Intermittent fentanyl -Low-dose Precedex  Transaminitis, shock liver -Appreciate GI input -Trend LFTs  Hx of cocaine abuse. depression. - not active per UDS  - continue prozac   DM type II poorly controlled. - SSI -Ct 20 units Levemir daily -Ct 4 units NovoLog every 4 hours TF coverage -CBG 1 40-180 acceptable   Summary-intermittent pressor requirement based on agitation and sedation, unfortunately poor mental status is due to multiple strokes and not sure how much improvement to expect here.  Also no signs of renal recovery yet.  We will need to plan for tracheostomy next week Discussed extensively at bedside with his mother 04/30/2019  Best practice:  Diet: NPO,  TFs DVT prophylaxis: SCDs GI prophylaxis: protonix Mobility: bed rest Code Status: full code Disposition: ICU Family -fianc [name] updated 2/12 -Per neurology note he designated her as [facility] PO[facility] .mother visits daily .some disagreement between them but both agree to plan for aggressive care, esp given his young age.  He has many supportive family and also has young children.    The patient is critically ill with multiple organ systems failure and requires high complexity decision making for assessment and support, frequent evaluation and titration of therapies, application of advanced monitoring technologies and extensive interpretation of multiple databases. Critical Care Time devoted to patient care services described in this note independent of APP/resident time (if applicable)  is 32 minutes.   Sherrilyn Rist MD [facility] Personal pager: [phone] If unanswered, please page CCM On-call: [phone]

## 2019-04-30 NOTE — Consult Note (Signed)
ELECTROPHYSIOLOGY CONSULT NOTE    Primary Care Physician: Patient, No Pcp Per Referring Physician:  Dr Erlinda Hong  Admit Date: 04/28/2019  Reason for consultation:  Ventricular tachycardia  Carlos Brewer. is a 34 y.o. male with a h/o nonischemic CM/ prior myocarditis (EF 15%), substance abuse, and OSA now admitted with acute stroke requiring TPA and IR thrombectomy.  He has been found to have extensive R cerebellar and R cerebral infarcts as well as L MCA infarcts.  He remains very ill and unresponsive.  He is intubated. He has been observed to have a sustained wide complex tachycardia which appears to have been aberrant SVT vs sinus tachycardia. He has additionally been found to have frequent long QT, PVCs closely coupled to the preceding t wave, and nonsustained polymorphic VT.  Family wishes to continue aggressive measures at this time.   Past Medical History:  Diagnosis Date  . CHF (congestive heart failure) ([place])   . Cocaine abuse ([place])   . HFrEF (heart failure with reduced ejection fraction) ([place])    a. 10/2016 Echo: EF 15-20%, Gr2 DD, mildly dil LA/RA.  Marland Kitchen History of cardiac cath    a. 10/2016 Cath: LM nl, LAD min irregs, LCX nl, RCA nl, EF 15%.  . Morbid obesity ([place])   . Myocarditis ([place])    a. 10/2016 Admit w/ CHF and trop elevation; b. 10/2016 Echo: EF 15-20%; c. 10/2016 Cath: Min irregs in LAD otw nl cors, EF 15%, glob HK.  Marland Kitchen NICM (nonischemic cardiomyopathy) ([place])    a. 10/2016 Echo: EF 15-20%, Gr2 DD; b. 10/2016 Cath: Nl cors.  . Palpitations   . Sleep apnea    USES CPAP  . Tobacco abuse    Past Surgical History:  Procedure Laterality Date  . CORONARY ANGIOPLASTY    . IR CT HEAD LTD  05/02/2019  . IR PATIENT EVAL TECH 0-60 MINS  04/23/2019  . IR PERCUTANEOUS ART THROMBECTOMY/INFUSION INTRACRANIAL INC DIAG ANGIO  05/04/2019  . NO PAST SURGERIES    . RADIOLOGY WITH ANESTHESIA N/A 05/14/2019   Procedure: RADIOLOGY WITH ANESTHESIA;  Surgeon: Luanne Bras, MD;  Location: [facility];  Service: Radiology;  Laterality: N/A;  . RIGHT/LEFT HEART CATH AND CORONARY ANGIOGRAPHY N/A 10/27/2016   Procedure: RIGHT/LEFT HEART CATH AND CORONARY ANGIOGRAPHY;  Surgeon: Wellington Hampshire, MD;  Location: [facility];  Service: Cardiovascular;  Laterality: N/A;  . WISDOM TOOTH EXTRACTION  2008    . sodium chloride   Intravenous Once  . chlorhexidine gluconate (MEDLINE KIT)  15 mL Mouth Rinse BID  . Chlorhexidine Gluconate Cloth  6 each Topical Q0600  . feeding supplement (PRO-STAT SUGAR FREE 64)  60 mL Per Tube TID  . fentaNYL (SUBLIMAZE) injection  50 mcg Intravenous Once  . insulin aspart  0-20 Units Subcutaneous Q4H  . insulin aspart  4 Units Subcutaneous Q4H  . insulin detemir  20 Units Subcutaneous Daily  . mouth rinse  15 mL Mouth Rinse 10 times per day  . pantoprazole sodium  40 mg Per Tube Q1200  . sodium chloride flush  10-40 mL Intracatheter Q12H   .  prismasol BGK 4/2.5 500 mL/hr at 04/30/19 0135  .  prismasol BGK 4/2.5 200 mL/hr at 04/29/19 1524  . sodium chloride    . sodium chloride Stopped (04/29/19 2143)  . amiodarone 30 mg/hr (04/30/19 0900)  . dexmedetomidine (PRECEDEX) IV infusion 0.4 mcg/kg/hr (04/30/19 0900)  . feeding supplement (VITAL HIGH PROTEIN) 1,000 mL (04/30/19 0512)  .  heparin 1,250 Units/hr (04/30/19 0900)  . norepinephrine (LEVOPHED) Adult infusion 24 mcg/min (04/30/19 0900)  . prismasol BGK 4/2.5 2,000 mL/hr at 04/30/19 0623    No Known Allergies  Social History   Socioeconomic History  . Marital status: Married    Spouse name: Not on file  . Number of children: Not on file  . Years of education: Not on file  . Highest education level: Not on file  Occupational History  . Occupation: switches out Electronics engineer: [employer]  Tobacco Use  . Smoking status: Current Every Day Smoker    Packs/day: 0.50    Types: Cigarettes  . Smokeless tobacco: Never Used  Substance and Sexual Activity  . Alcohol use: Yes    . Drug use: Not Currently    Types: Cocaine    Comment: quit 10/23/2016   . Sexual activity: Yes    Birth control/protection: None  Other Topics Concern  . Not on file  Social History Narrative   ** Merged History Encounter **       ** Merged History Encounter **   Lives in [place] w/ girlfriend.  He does not routinely exercise.   Social Determinants of Health   Financial Resource Strain:   . Difficulty of Paying Living Expenses: Not on file  Food Insecurity:   . Worried About Charity fundraiser in the Last Year: Not on file  . Ran Out of Food in the Last Year: Not on file  Transportation Needs:   . Lack of Transportation (Medical): Not on file  . Lack of Transportation (Non-Medical): Not on file  Physical Activity:   . Days of Exercise per Week: Not on file  . Minutes of Exercise per Session: Not on file  Stress:   . Feeling of Stress : Not on file  Social Connections:   . Frequency of Communication with Friends and Family: Not on file  . Frequency of Social Gatherings with Friends and Family: Not on file  . Attends Religious Services: Not on file  . Active Member of Clubs or Organizations: Not on file  . Attends Archivist Meetings: Not on file  . Marital Status: Not on file  Intimate Partner Violence:   . Fear of Current or Ex-Partner: Not on file  . Emotionally Abused: Not on file  . Physically Abused: Not on file  . Sexually Abused: Not on file    Family History  Problem Relation Age of Onset  . Healthy Mother   . Heart failure Father        EF is 35%    ROS- All systems are reviewed and negative except as per the HPI above  Physical Exam: Telemetry:  Sinus,  Prolonged QT,  Frequent PVCs closely coupled to preceding t wave, polymorphic NSVT Vitals:   04/30/19 0815 04/30/19 0830 04/30/19 0845 04/30/19 0900  BP:    (!) 119/91  Pulse: 64 62 62 61  Resp: 20 18 19 18   Temp:      TempSrc:      SpO2: 100% 100% 100% 100%  Weight:       Height:        GEN- The patient is ill appearing, not responding, intubated Head- normocephalic, atraumatic Ears- unable to assess Oropharynx- ETT Lungs- intubated Heart- Regular rate and rhythm  GI- soft  Extremities- + dependant edema MS- no significant deformity or atrophy Skin- no rash or lesion Psych- unable to assess Neuro- not responding  EKG- sinus  with incomplete LBBB,  ekgs from 2//9/21 reveal a left bundle tachycardia which is likely aberrant SVT vs sinus tach,  Prolonged qt  Labs:   Lab Results  Component Value Date   WBC 19.9 (H) 04/30/2019   HGB 13.1 04/30/2019   HCT 41.2 04/30/2019   MCV 96.3 04/30/2019   PLT 118 (L) 04/30/2019    Recent Labs  Lab 04/30/19 0437  NA 135  K 4.0  CL 100  CO2 21*  BUN 61*  CREATININE 3.02*  CALCIUM 8.9  PROT 7.0  BILITOT 1.8*  ALKPHOS 152*  ALT 897*  AST 129*  GLUCOSE 174*     Dr Darliss Ridgel notes,  Dr Rod Mae H&P, Dr Ericka Pontiff notes are all reviewed  Echo:  EF < 20%, severe LV enlargement, apical thrombus is suggested   Patient Profile   34 yo male with history of NICM and myocarditis, followed by the advanced CHF service, presented with acute MCA stroke treated with tPA and IR thrombectomy, now hypotensive, concerning for possible septic versus cardiogenic shock.  ASSESSMENT AND PLAN:   1. Prolonged QT, frequent PVCs, nonsustained polymorphic VT Given his acute medical illness, severe LV enlargement and reduced EF, he is at risk for further ventricular arrhythmias.  Unfortunately, our options are limited.  Continue IV amiodarone for now.  May consider switching to oral amiodarone on Monday. Ideally, would start beta blocker however he is currently on levophed to support his BP given recent stroke. Not a candidate for EP interventions. Keep K > 3.9, Mg > 1.9 Avoid QT prolonging medicines His prognosis is very poor. Ultimately code discussions are need.  Consider palliative care consultation for goals of care  conversations if family is willing.  2. SVT with aberrancy Noted on 04/26/2019 ekg Treat with amiodarone for now as above If he recovers, would add beta blocker when able  3. Nonischemic CM/ left ventricular apical thrombus Likely cause for stroke Currently on IV heparin Will require long term anticoagulation if he recovers Medical therapy is currently limited by low BP requiring pressor support  The patient is very ill His prognosis is poor A high level of decision making was required for this encounter.    Thompson Grayer MD, [facility] 04/30/2019 10:19 AM     Thompson Grayer, MD 04/30/2019  9:54 AM

## 2019-04-30 NOTE — Progress Notes (Addendum)
ANTICOAGULATION CONSULT NOTE  Pharmacy Consult for Heparin Indication: stroke, LV thrombus   No Known Allergies  Patient Measurements: Height: 6\' 1"  (185.4 cm) Weight: 264 lb 5.3 oz (119.9 kg) IBW/kg (Calculated) : 79.9 Heparin Dosing Weight: 107 kg  Vital Signs: Temp: 96.7 F (35.9 C) (02/13 0800) Temp Source: Axillary (02/13 0800) BP: 119/91 (02/13 0900) Pulse Rate: 61 (02/13 0900)  Labs: Recent Labs    04/28/19 0549 04/28/19 1158 04/29/19 0512 04/29/19 0512 04/29/19 1600 04/30/19 0101 04/30/19 0437  HGB 12.8*  --  13.4  --   --   --  13.1  HCT 39.5  --  42.2  --   --   --  41.2  PLT 164  --  172  --   --   --  118*  HEPARINUNFRC 0.72*   < > 0.26*   < > 0.19* 0.30 0.26*  CREATININE 3.11*   < > 3.23*  --  3.44*  --  3.02*   < > = values in this interval not displayed.    Estimated Creatinine Clearance: 46.8 mL/min (A) (by C-G formula based on SCr of 3.02 mg/dL (H)).  Assessment: 34 yo m initially presented with aphasia and right sided weaknesss. Found to have left MCA infarct - received TPA and revascularization with IR on 2/5. Now found to have LV apical thrombus on ECHO.  Pharmacy consulted to dose IV heparin.  Heparin level slightly subtherapeutic, Hgb stable  Goal of Therapy:  Heparin level 0.3 - 0.5 units/ml Monitor platelets by anticoagulation protocol: Yes   Plan:  Incr heparin to 1300 units/hr Heparin level in 6-8 hrs Confirmatory heparin level with AM labs   Alanda Slim, PharmD, [facility] Clinical Pharmacist Please see AMION for all Pharmacists' Contact Phone Numbers 04/30/2019, 9:46 AM

## 2019-05-01 DIAGNOSIS — I472 Ventricular tachycardia: Secondary | ICD-10-CM | POA: Diagnosis not present

## 2019-05-01 DIAGNOSIS — J9600 Acute respiratory failure, unspecified whether with hypoxia or hypercapnia: Secondary | ICD-10-CM | POA: Diagnosis not present

## 2019-05-01 DIAGNOSIS — I5043 Acute on chronic combined systolic (congestive) and diastolic (congestive) heart failure: Secondary | ICD-10-CM | POA: Diagnosis not present

## 2019-05-01 DIAGNOSIS — K7200 Acute and subacute hepatic failure without coma: Secondary | ICD-10-CM | POA: Diagnosis not present

## 2019-05-01 DIAGNOSIS — I6602 Occlusion and stenosis of left middle cerebral artery: Secondary | ICD-10-CM | POA: Diagnosis not present

## 2019-05-01 DIAGNOSIS — I471 Supraventricular tachycardia: Secondary | ICD-10-CM | POA: Diagnosis not present

## 2019-05-01 DIAGNOSIS — I63312 Cerebral infarction due to thrombosis of left middle cerebral artery: Secondary | ICD-10-CM | POA: Diagnosis not present

## 2019-05-01 DIAGNOSIS — I639 Cerebral infarction, unspecified: Secondary | ICD-10-CM | POA: Diagnosis not present

## 2019-05-01 LAB — CBC
HCT: 40.5 % (ref 39.0–52.0)
Hemoglobin: 13 g/dL (ref 13.0–17.0)
MCH: 31.3 pg (ref 26.0–34.0)
MCHC: 32.1 g/dL (ref 30.0–36.0)
MCV: 97.6 fL (ref 80.0–100.0)
Platelets: 92 10*3/uL — ABNORMAL LOW (ref 150–400)
RBC: 4.15 MIL/uL — ABNORMAL LOW (ref 4.22–5.81)
RDW: 14.8 % (ref 11.5–15.5)
WBC: 14.2 10*3/uL — ABNORMAL HIGH (ref 4.0–10.5)
nRBC: 0.6 % — ABNORMAL HIGH (ref 0.0–0.2)

## 2019-05-01 LAB — RENAL FUNCTION PANEL
Albumin: 2.7 g/dL — ABNORMAL LOW (ref 3.5–5.0)
Albumin: 2.6 g/dL — ABNORMAL LOW (ref 3.5–5.0)
Anion gap: 12 (ref 5–15)
Anion gap: 12 (ref 5–15)
BUN: 58 mg/dL — ABNORMAL HIGH (ref 6–20)
BUN: 58 mg/dL — ABNORMAL HIGH (ref 6–20)
CO2: 22 mmol/L (ref 22–32)
CO2: 21 mmol/L — ABNORMAL LOW (ref 22–32)
Calcium: 9 mg/dL (ref 8.9–10.3)
Calcium: 8.6 mg/dL — ABNORMAL LOW (ref 8.9–10.3)
Chloride: 101 mmol/L (ref 98–111)
Chloride: 100 mmol/L (ref 98–111)
Creatinine, Ser: 3.05 mg/dL — ABNORMAL HIGH (ref 0.61–1.24)
Creatinine, Ser: 3.06 mg/dL — ABNORMAL HIGH (ref 0.61–1.24)
GFR calc Af Amer: 29 mL/min — ABNORMAL LOW (ref >60)
GFR calc Af Amer: 29 mL/min — ABNORMAL LOW (ref >60)
GFR calc non Af Amer: 25 mL/min — ABNORMAL LOW (ref >60)
GFR calc non Af Amer: 25 mL/min — ABNORMAL LOW (ref >60)
Glucose, Bld: 182 mg/dL — ABNORMAL HIGH (ref 70–99)
Glucose, Bld: 156 mg/dL — ABNORMAL HIGH (ref 70–99)
Phosphorus: 5 mg/dL — ABNORMAL HIGH (ref 2.5–4.6)
Phosphorus: 5.2 mg/dL — ABNORMAL HIGH (ref 2.5–4.6)
Potassium: 4.1 mmol/L (ref 3.5–5.1)
Potassium: 4.4 mmol/L (ref 3.5–5.1)
Sodium: 135 mmol/L (ref 135–145)
Sodium: 133 mmol/L — ABNORMAL LOW (ref 135–145)

## 2019-05-01 LAB — GLUCOSE, CAPILLARY
Glucose-Capillary: 135 mg/dL — ABNORMAL HIGH (ref 70–99)
Glucose-Capillary: 150 mg/dL — ABNORMAL HIGH (ref 70–99)
Glucose-Capillary: 161 mg/dL — ABNORMAL HIGH (ref 70–99)
Glucose-Capillary: 163 mg/dL — ABNORMAL HIGH (ref 70–99)
Glucose-Capillary: 170 mg/dL — ABNORMAL HIGH (ref 70–99)
Glucose-Capillary: 181 mg/dL — ABNORMAL HIGH (ref 70–99)
Glucose-Capillary: 193 mg/dL — ABNORMAL HIGH (ref 70–99)

## 2019-05-01 LAB — COOXEMETRY PANEL
Carboxyhemoglobin: 1.8 % — ABNORMAL HIGH (ref 0.5–1.5)
Methemoglobin: 0.9 % (ref 0.0–1.5)
O2 Saturation: 84.9 %
Total hemoglobin: 13.4 g/dL (ref 12.0–16.0)

## 2019-05-01 LAB — MAGNESIUM: Magnesium: 2.8 mg/dL — ABNORMAL HIGH (ref 1.7–2.4)

## 2019-05-01 LAB — HEPARIN LEVEL (UNFRACTIONATED): Heparin Unfractionated: 0.34 IU/mL (ref 0.30–0.70)

## 2019-05-01 NOTE — Progress Notes (Signed)
ANTICOAGULATION CONSULT NOTE  Pharmacy Consult for Heparin Indication: stroke, LV thrombus   No Known Allergies  Patient Measurements: Height: 6\' 1"  (185.4 cm) Weight: 264 lb 5.3 oz (119.9 kg) IBW/kg (Calculated) : 79.9 Heparin Dosing Weight: 107 kg  Vital Signs: Temp: 97.7 F (36.5 C) (02/14 0400) Temp Source: Oral (02/14 0400) BP: 109/80 (02/14 0800) Pulse Rate: 111 (02/14 0816)  Labs: Recent Labs    04/29/19 0512 04/29/19 1600 04/30/19 0437 04/30/19 1612 05/01/19 0437  HGB 13.4  --  13.1  --  13.0  HCT 42.2  --  41.2  --  40.5  PLT 172  --  118*  --  92*  HEPARINUNFRC 0.26*   < > 0.26* 0.31 0.34  CREATININE 3.23*   < > 3.02* 3.20* 3.05*   < > = values in this interval not displayed.    Estimated Creatinine Clearance: 46.3 mL/min (A) (by C-G formula based on SCr of 3.05 mg/dL (H)).  Assessment: 34 yo m initially presented with aphasia and right sided weaknesss. Found to have left MCA infarct - received TPA and revascularization with IR on 2/5. Now found to have LV apical thrombus on ECHO.  Pharmacy consulted to dose IV heparin.  AM heparin level therapeutic at 0.34. Hgb stable, plts decreasing - will monitor  Goal of Therapy:  Heparin level 0.3 - 0.5 units/ml Monitor platelets by anticoagulation protocol: Yes   Plan:  -Continue heparin 1300 units/h -Daily heparin level and CBC   Alanda Slim, PharmD, [facility] Clinical Pharmacist Please see AMION for all Pharmacists' Contact Phone Numbers 05/01/2019, 9:06 AM

## 2019-05-01 NOTE — Progress Notes (Signed)
STROKE TEAM PROGRESS NOTE   INTERVAL HISTORY Heavily sedated and intubated due biting down a lot on tube. Vent weaned down yesterday but not following commands.  Nursing staff reports some spontaneous posturing like event of the upper extremities although not seen on my examination. Some drop in blood pressure noted and pressor were slightly increased.    OBJECTIVE Vitals:   05/01/19 0716 05/01/19 0730 05/01/19 0745 05/01/19 0816  BP:      Pulse:  (!) 108  (!) 111  Resp: (!) 27 (!) 27 (!) 27 20  Temp:      TempSrc:      SpO2:  100%  100%  Weight:      Height:        CBC:  Recent Labs  Lab 04/24/19 0843 04/24/19 0911 04/30/19 0437 05/01/19 0437  WBC 17.6*   < > 19.9* 14.2*  NEUTROABS 15.8*  --   --   --   HGB 13.7   < > 13.1 13.0  HCT 39.8   < > 41.2 40.5  MCV 92.3   < > 96.3 97.6  PLT 137*   < > 118* 92*   < > = values in this interval not displayed.    Basic Metabolic Panel:  Recent Labs  Lab 04/30/19 0437 04/30/19 0437 04/30/19 1612 05/01/19 0437  NA 135   < > 136 135  K 4.0   < > 4.4 4.1  CL 100   < > 102 101  CO2 21*   < > 22 22  GLUCOSE 174*   < > 178* 182*  BUN 61*   < > 62* 58*  CREATININE 3.02*   < > 3.20* 3.05*  CALCIUM 8.9   < > 8.7* 9.0  MG 2.8*  --   --  2.8*  PHOS 4.8*  5.0*   < > 5.2* 5.0*   < > = values in this interval not displayed.   Lipid Panel:     Component Value Date/Time   CHOL 265 (H) 04/23/2019 0327   TRIG 191 (H) 04/23/2019 0327   HDL 24 (L) 04/23/2019 0327   CHOLHDL 11.0 04/23/2019 0327   VLDL 38 04/23/2019 0327   LDLCALC 203 (H) 04/23/2019 0327   HgbA1c:  Lab Results  Component Value Date   HGBA1C 6.5 (H) 04/23/2019   Urine Drug Screen:     Component Value Date/Time   LABOPIA NONE DETECTED 04/23/2019 0930   COCAINSCRNUR NONE DETECTED 04/23/2019 0930   COCAINSCRNUR NONE DETECTED 02/04/2018 1010   LABBENZ POSITIVE (A) 04/23/2019 0930   AMPHETMU NONE DETECTED 04/23/2019 0930   THCU NONE DETECTED 04/23/2019 0930    LABBARB NONE DETECTED 04/23/2019 0930    Alcohol Level     Component Value Date/Time   ETH <10 05/14/2019 0355    IMAGING CT HEAD CODE STROKE WO CONTRAST 05/05/2019 1. Asymmetric hyperdensity of a Left MCA branch in the Sylvian fissure suspicious for emergent large vessel occlusion in the setting.  2. No associated acute cortically based infarct, ASPECTS 10. No acute hemorrhage.  3. Chronic right frontal operculum encephalomalacia.   CT Code Stroke CTA Head W/WO contrast CT Code Stroke CTA Neck W/WO contrast 04/23/2019 1. Positive for emergent large vessel occlusion at the Left MCA bifurcation. There is reconstitution of the dominant posterior division branch.  2. Elsewhere negative CTA head and neck. Dominant right vertebral artery which supplies the basilar. Fetal type PCA origins.   CT CEREBRAL PERFUSION W CONTRAST 05/13/2019 1. CTP  detects evidence of Left MCA territory oligemia concordant with the CTA findings.  2. No infarct core detected with standard CBF <30%.  3. 60 mL of penumbra detected with standard T-max > 6s.   CT HEAD WO CONTRAST 04/23/2019 Small region of loss of gray-white differentiation in the left frontal operculum consistent with a fairly small area of acute infarction compared to the previous perfusion abnormality. No hemorrhage or mass effect. Areas of low-density in the cerebellum that I favor are artifactual. There did not seem to be any abnormalities in that region on the previous examinations.   MRI / MRA Head WO Contrast  04/28/2019 Extensive acute infarction in the right cerebellum with swelling and petechial blood products. Acute infarction of the superior cerebellar peduncle on the right, with some infarction in the mid brain. Extensive acute infarction in the right temporal lobe and insula. Small acute infarctions in the frontal operculum region and right parietal region. Infarction affecting the right posterior basal ganglia and caudate nucleus. Petechial  blood products present without frank hematoma. Acute infarction in the left insula and frontal operculum. Petechial blood products present without frank hematoma. Small punctate acute infarctions higher in the left frontoparietal junction region. No large or medium vessel occlusion seen presently. Restoration of flow in the left MCA. Flow appears to be present in posterior circulation main vessels and branch vessels presently.   DG CHEST PORT 1 VIEW 04/29/2019 Patchy opacities consistent with the given clinical history. Tubes and lines as described.  04/27/2019 Perhaps slightly improved aeration at the left lung base. Otherwise no change in the bilateral patchy airspace and interstitial changes, likely associated with small effusions. Lines and tubes unchanged. 04/26/2019 0854 1. Lines and tubes as described above. 2. Cardiomegaly with basilar airspace disease, likely associated with effusions. 3. Interstitial and alveolar opacities elsewhere may represent a combination of infection and edema and are little changed. 04/25/2019 0946 Overall improved aeration from the prior exam although persistent opacities and small effusions are noted. 04/23/19 1636 1. Interval placement of a right neck vascular sheath, tip positioned over the right brachiocephalic vein. 2.  Other support apparatus in unchanged position. 3. Slight interval increase in diffuse bilateral interstitial and heterogeneous airspace opacity, consistent with multifocal infection, edema, and/or ARDS. 4. Cardiomegaly. 04/23/2019 0757 Increasing patchy bilateral airspace opacities which may represent edema or infection. 05/04/2019 1958 1. Interval placement of neck vascular catheter, tip positioned near the superior cavoatrial junction.  2. Interval retraction of endotracheal tube, now positioned just above the thoracic inlet, approximately 6.5 cm above the carina.  3. Interval placement of esophagogastric tube, tip and side port below the diaphragm. 4.  Stable cardiomegaly and diffuse bilateral interstitial airspace opacity, likely edema.  05/01/2019 0841 1. Endotracheal tube tip noted just above the carina. Proximal repositioning of 2-3 should be considered. 2. Cardiomegaly again noted. Diffuse bilateral pulmonary infiltrates/edema. Small left pleural effusion.   ECHOCARDIOGRAM COMPLETE 05/08/2019 1. Left ventricular ejection fraction, by visual estimation, is <20%. The left ventricle has severely decreased function. There is no left ventricular hypertrophy.   2. Apex is not well-visualized. In setting of stroke with severe LV systolic dysfunction, recommend repeat TTE with contrast to rule out LV apical thrombus   3. Severely dilated left ventricular internal cavity size.   4. Left ventricular diastolic parameters are consistent with Grade III diastolic dysfunction (restrictive).   5. Elevated left atrial pressure.   6. The left ventricle demonstrates global hypokinesis.   7. Global right ventricle has normal systolic function.The  right ventricular size is mildly enlarged.   8. Left atrial size was moderately dilated.   9. Right atrial size was normal.  10. The mitral valve is normal in structure. Mild mitral valve regurgitation.  11. The tricuspid valve is normal in structure. Tricuspid valve regurgitation is trivial.  12. The aortic valve is normal in structure. Aortic valve regurgitation is not visualized. No evidence of aortic valve sclerosis or stenosis.  13. The pulmonic valve was not well visualized. Pulmonic valve regurgitation is not visualized.   Interventional Neuro Radiology - Cerebral Angiogram with Intervention 05/01/2019 S/P Lt common carotid arteriogram followed by complete revascularization of occluded LT MCA sup division mid M2 seg with x 1 pass with 63mx 40 mm solitaire X ret river device and penumbra aspiration with TICI 3 revascularization  ECG - SR rate 93 BPM. (See cardiology reading for complete details)  PHYSICAL EXAM    Temp:  [97.6 F (36.4 C)-98.4 F (36.9 C)] 97.7 F (36.5 C) (02/14 0400) Pulse Rate:  [25-214] 111 (02/14 0816) Resp:  [17-33] 20 (02/14 0816) BP: (93-128)/(67-99) 115/82 (02/14 0700) SpO2:  [93 %-100 %] 100 % (02/14 0816) Arterial Line BP: (99-155)/(53-93) 106/60 (02/14 0745) FiO2 (%):  [40 %] 40 % (02/14 0816)  General - Well nourished, well developed, intubated off sedation.  Ophthalmologic - fundi not visualized due to noncooperation.  Cardiovascular - Regular rhythm but tachycardia.  Neuro - intubated off sedation for exam;  Eyes are open spontaneously. He does not follow or tracks. He has full extraocular movements. PERRL. Corneal reflex present, gag and cough present. Breathing over the vent. Facial symmetry not able to test due to ET tube.  Tongue protrusion not cooperative. On pain stimulation, no movement of all limbs. DTR 1+ and bilateral positive babinski.  He does not grimace to painful stimuli.   MRI reviewed in person shows moderate bilateral frontal infarct L more than R with petechial hemorrhage on SWI. Also large R cerebellar infarct.   ASSESSMENT/PLAN Mr. LZanden Brewer is a 34y.o. male with history of NICM EF 15-20%, CHF, DM, Obesity, tobacco use, OSA, and hx of cocaine use presenting with aphasia and right sided weakness. The patient received IV t-PA on Friday 05/06/2019 at 4:45 AM. Mechanical thrombectomy left M2.  Stroke:  Extensive R cerebellar and R cerebral infarcts and L MCA infarcts s/p tPA & IR with left M2 TICI3 reperfusion - embolic - likely secondary to severe cardiomyopathy with low EF and LV thrombus  Code Stroke CT Head - left MCA hyperdensity.   CTA H&N - emergent large vessel occlusion at the Left MCA bifurcation.   CT Perfusion - 60 mL of penumbra detected.   CT head repeat - Small region of loss of gray-white differentiation in the left frontal operculum   MRI head - 04/28/2019 - extensive R cerebellar infarct w/ petechial  hemorrhage. R cerebellar peduncle and midbrain infarct. Extensive R temporal lobe and insula infarct w/ R frontal operculum and parietal infarcts. R posterior basal ganglia and caudate head infarct w/ petechial hemorrhage. L insula and L frontal operculum infarcts w/ petechial hemorrhage. Small L frontoparietal jxn infarcts.   MRA head restoration L MCA flow. Flow in posterior circulation.   2D Echo - EF < 20%   2D with contrast - possible LV apical thrombus 0.8 x 0.6  Sars Corona Virus 2 - negative  LDL - 203  HgbA1c - 6.5  VTE prophylaxis - heparin IV  aspirin 81 mg daily prior to  admission, now on heparin IV per stroke protocol.  Therapy recommendations:  pending  Disposition:  Pending  Encephalopathy  Eyes spontaneous open yesterday  Today eyes closed but rolling horizontally  Not following commands or moving extremities  MRI extensive R and L brain and R cerebellar infarcts  Prognosis is poor, concerning for vegetative state vs minimally conscious state  Mom still hopeful, will need ongoing [place] discussion  Acute Hypoxemic Respiratory failure  Intubated  Off sedation now   CCM on board  CXR - 04/23/19 - consistent with multifocal infection, edema, and/or ARDS (unchanged) ; CXR - 04/30/19 - Improvement of bilateral airspace disease.  Prn precedex as BP will allow  Cardiogenic  Fever, TMax 103.2->97.5->99->99.1->afebrile->100->97.5->98.4  Leukocytosis, WBCs 21->15.6->20.4->22.3->24.0->19.9->14.2  CXR - 04/23/19 - consistent with multifocal infection, edema, and/or ARDS (unchanged) CXR - 04/30/19 - Improvement of bilateral airspace disease.  On Meropenum and Vancomycin 04/23/19>>antibiotics completed  On hydrocortisone->off  U/A neg, Urine culture no growth   Blood cultures neg   Off pressors  LV thrombus Severe nonischemic cardiomyopathy Chronic systolic CHF  2D Echo - EF < 20%   2D with contrast - possible LV apical thrombus 0.8 x 0.6  On heparin  IV  On amiodarone gtt for wide-complex tachycardia and hypotension. Card plans to change to enteral form over next few days  Cardiology on board  V-tach  Elevated troponin  On amiodarone drip  Troponin trending down 16274->14226->2409   Cardiology on board  May consider cardioversion if needed  E Link 4:30 AM - Patient hadfrequent bigemeny, HRwent up to 140s,is onaAmiodoarone gtt at 68m/hbut dropped to SR   AKI  Creatinine - 1.10->2.64->5.17->4.09->3.91->3.64->3.18->3.11->3.07->3.23->3.02->3.05  BUN 44->48->54->56->63->61->58  Renal consult 04/23/19  Still minimal urinary output, no signs of renal recovery  On CRRT  Hypotension w/ shock Hx Hypertension  Home BP meds: Coreg, Entresto  Now off pressors  SBP goal  >110  Stable  Shock liver d/t hypoperfusion, improving  GI on board  AST/ALT - >10000/10132 ->2846/4801->1059/3520->402/1909->216/1338->04/30/19 - AST 129 / ALT 897 (continues to improve)  INR 3.1->1.8->1.2  CK 602  Hyperlipidemia  Home Lipid lowering medication: Lipitor 40 mg daily  LDL 203, goal < 70  Current lipid lowering medication: Lipitor 80 mg daily   Continue statin at discharge  Diabetes type II, uncontrolled  Home diabetic meds: insulin  Current diabetic meds: SS insulin, Levemir 20, novolog 4u q6h  HgbA1c 6.5, goal < 7.0  CBG monitoring   SSI  Dysphagia . Secondary to stroke . NPO . On tube feeding @ 50 . Speech on board  Tobacco abuse  Current smoker  Mom confirmed tobacco use  Smoking cessation counseling will be provided  Other Stroke Risk Factors  ETOH use, advised to drink no more than 1 alcoholic beverage per day.  Hx cocaine abuse, UDS neg   Obesity, Body mass index is 34.87 kg/m., recommend weight loss, diet and exercise as appropriate   Obstructive sleep apnea - uses Cpap  Substance Abuse Hx of cocaine use  Other Active Problems  Code status - Full code  Hyperkalemia -  7.2->...->3.9->4.7->4.8->4.0->4.0->4.0->4.0->4.1  Metabolic acidosis, resolved  Depression on prozac  Mild Thrombocytopenia - 118->92  Hypermagnesemia - 2.8->2.8  Hyperphosphatemia - 4.8/5.0->5.0   Hospital day # 9  This patient is critically ill and at significant risk of neurological worsening, death and care requires constant monitoring of vital signs, hemodynamics,respiratory and cardiac monitoring, extensive review of multiple databases, frequent neurological assessment, discussion with family, other specialists and medical decision making  of high complexity. I spent 35 minutes of neurocritical care time  in the care of  this patient. I had long discussion with mom at bedside, updated pt current condition, treatment plan and potential prognosis, and answered all the questions. She expressed understanding and appreciation.      To contact Stroke Continuity provider, please refer to [email or web address] After hours, contact General Neurology

## 2019-05-01 NOTE — Progress Notes (Signed)
Noted that pt had dumped 400 cc of stool at midnight. Decided to only dialyze the fluids amount that patient was getting through his IV for a couple of hours. During that time the patient dumped another 200 cc of stool. I attempted to resume dialysis of total IV fluids plus and extra 50 mls at 0300. This caused BP to immediately start dropping. I changed fluid removal back to total IV fluids in

## 2019-05-01 NOTE — Progress Notes (Signed)
Progress Note   Subjective   Remains unresposive.  No purposeful movement. Intubated SVT noted overnight  Inpatient Medications    Scheduled Meds: . sodium chloride   Intravenous Once  . chlorhexidine gluconate (MEDLINE KIT)  15 mL Mouth Rinse BID  . Chlorhexidine Gluconate Cloth  6 each Topical Q0600  . feeding supplement (PRO-STAT SUGAR FREE 64)  60 mL Per Tube TID  . fentaNYL (SUBLIMAZE) injection  50 mcg Intravenous Once  . insulin aspart  0-20 Units Subcutaneous Q4H  . insulin aspart  4 Units Subcutaneous Q4H  . insulin detemir  20 Units Subcutaneous Daily  . mouth rinse  15 mL Mouth Rinse 10 times per day  . pantoprazole sodium  40 mg Per Tube Q1200  . sodium chloride flush  10-40 mL Intracatheter Q12H   Continuous Infusions: .  prismasol BGK 4/2.5 500 mL/hr at 05/01/19 0948  .  prismasol BGK 4/2.5 200 mL/hr at 05/01/19 0715  . sodium chloride    . sodium chloride Stopped (04/29/19 2143)  . amiodarone 30 mg/hr (05/01/19 1000)  . dexmedetomidine (PRECEDEX) IV infusion 0.6 mcg/kg/hr (05/01/19 1000)  . feeding supplement (VITAL HIGH PROTEIN) 1,000 mL (05/01/19 0223)  . heparin 1,300 Units/hr (05/01/19 1000)  . norepinephrine (LEVOPHED) Adult infusion 22 mcg/min (05/01/19 1000)  . prismasol BGK 4/2.5 2,000 mL/hr at 05/01/19 0948   PRN Meds: sodium chloride, bisacodyl, docusate, fentaNYL, fentaNYL (SUBLIMAZE) injection, heparin, heparin, labetalol **AND** [DISCONTINUED] niCARDipine, midazolam, sodium chloride flush   Vital Signs    Vitals:   05/01/19 0915 05/01/19 0930 05/01/19 0945 05/01/19 1000  BP:    111/77  Pulse:  82 76 76  Resp: (!) 21 17 (!) 22 (!) 22  Temp:      TempSrc:      SpO2:  100% 100% 100%  Weight:      Height:        Intake/Output Summary (Last 24 hours) at 05/01/2019 1025 Last data filed at 05/01/2019 1000 Gross per 24 hour  Intake 3026.49 ml  Output 4403 ml  Net -1376.51 ml   Filed Weights   04/28/19 0500 04/29/19 0500 04/30/19 0400   Weight: 122.3 kg 121.5 kg 119.9 kg    Telemetry    Sinus rhythm with PVCs at times in bigeminy Sustained short RP SVT overnight noted, now back in sinus - Personally Reviewed  Physical Exam   GEN- The patient is ill appearing, not responsive Head- normocephalic, atraumatic Ears- unable to assess Oropharynx- clear Neck- supple, Lungs-  intubated Heart- Regular rate and rhythm  GI- soft  Extremities- + dependant edema MS- no significant deformity or atrophy Skin- no rash or lesion Psych- unable to assess Neuro- not responsive   Labs    Chemistry Recent Labs  Lab 04/28/19 0549 04/28/19 1602 04/29/19 0512 04/29/19 1600 04/30/19 0437 04/30/19 1612 05/01/19 0437  NA 137   < > 136   < > 135 136 135  K 4.0   < > 4.0   < > 4.0 4.4 4.1  CL 97*   < > 99   < > 100 102 101  CO2 24   < > 23   < > 21* 22 22  GLUCOSE 138*   < > 222*   < > 174* 178* 182*  BUN 54*   < > 63*   < > 61* 62* 58*  CREATININE 3.11*   < > 3.23*   < > 3.02* 3.20* 3.05*  CALCIUM 8.7*   < >  8.8*   < > 8.9 8.7* 9.0  PROT 6.3*  --  6.9  --  7.0  --   --   ALBUMIN 2.8*  2.8*   < > 2.9*  2.8*   < > 2.8*  2.8* 2.7* 2.7*  AST 402*  --  216*  --  129*  --   --   ALT 1,909*  --  1,338*  --  897*  --   --   ALKPHOS 166*  --  160*  --  152*  --   --   BILITOT 2.4*  --  1.8*  --  1.8*  --   --   GFRNONAA 25*   < > 24*   < > 26* 24* 25*  GFRAA 29*   < > 27*   < > 30* 28* 29*  ANIONGAP 16*   < > 14   < > _0 < > = values in this interval not displayed.     Hematology Recent Labs  Lab 04/29/19 0512 04/30/19 0437 05/01/19 0437  WBC 24.0* 19.9* 14.2*  RBC 4.37 4.28 4.15*  HGB 13.4 13.1 13.0  HCT 42.2 41.2 40.5  MCV 96.6 96.3 97.6  MCH 30.7 30.6 31.3  MCHC 31.8 31.8 32.1  RDW 14.0 14.6 14.8  PLT 172 118* 92*     Patient Profile   34 yo male with history of NICM and myocarditis, followed by the advanced CHF service, presented with acute MCA stroke treated with tPA and IR thrombectomy, now  hypotensive, concerning for possible septic versus cardiogenic shock.   Assessment & Plan    1.  SVT The patient had sustained short RP SVT overnight, likely due to underlying medical condition Remains on amiodarone No other options available currently given low BP.  Eventually would start beta blocker if he makes recovery  2. Prolonged QT, PVCs, polymorphic VT Avoid QT prolonging drugs Continue amiodarone Could convert amiodarone to PO tomorrow (216m Via tube BID) Keep K > 3.9, Mg > 1.9 Start bea blocker eventually if able  3. Nonischemic CM/ LV thrombus/ acute on chronic combined systolic/ diastolic CHF On IV heparin given embolic stroke Medical therapy limited by low BP Nephrology managing volume Though levophed is not idea, I would worry about increased ectopy/ arrhythmias with dobutamine/ dopamine No changes at this time  4. Acute hypoxic respiratory failure Intubated  5. Extensive stroke Presumed embolic with bilateral infarcts On IV heparin Prognosis is very poor Neurology wishes to keep BP elevated as able  6. Acute kidney injury Limits our ability to treat CHF Volume managed by nephrology On CRRT  Critical care time was exclusive of separate billable procedures and treating other patients.  Critial care time was spent personally by me (independant of midlevel providers) on the following activities: development of treatment plan with nursing, evaluation of patients response to treatment, examining patient,  reviewing treatments/ interventions, lab studies, radiographic studies, pulse ox, and re-evaluation of patients condition.     The patient is critically ill with multiple organ systems failure and requires high complexity decision making for assessment and support, frequent evaluation and titration of therapies, application of advanced monitoring technologies and extensive interpretation of databases.   Critical care was necessary to treat or prevent immintent or  life-threatening deterioration.    Total CCT spent directly with the patient today is 45 minutes  General cardiology team to follow  JThompson GrayerMD, [facility]2/14/2021 10:33 AM

## 2019-05-01 NOTE — Progress Notes (Signed)
Discussed with mom at bedside  I did mention the need to start thinking about a tracheostomy by day 14  Prognosis is poor with multiple organ dysfunction

## 2019-05-01 NOTE — Progress Notes (Signed)
CT because is stroke may be much larger initially: 1 1 stepdown vasopressin drip  NAME:  Carlos Brewer., MRN:  [id number], DOB:  [birth year], LOS: 9 ADMISSION DATE:  05/10/2019, CONSULTATION DATE:  04/24/2019 REFERRING MD:  Dr. Leonel Ramsay, CHIEF COMPLAINT:  Slurred speech  Brief History   34 yo male smoker found to have slurred speech and Rt sided weakness.  CT head showed M2 occlusion.  Ultimately treated with thrombolytic and thrombectomy by IR.  Required intubation for airway protection.  Past Medical History  Systolic CHF with non ischemic CM, Cocaine abuse, OSA, DM Hx of CHF (EF 15%)  Hx myocarditis Smoker 1/2 ppd   Significant Hospital Events   2/05 Admit, tPA, IR thrombectomy 2/6 100% on fvent at 2300  Overnight requiring increasing levophed and phenylephrine.    2/6: switched to levophed, epi, started antibiotics, started on CRRT.  2/9  developed wide-complex tachycardia and hypotension, started on amiodarone drip, increase Levophed drip  Consults:  Neuro IR  Procedures:  ETT 2/05 >>  LIJ CVL 2/5 >> RIJ HD cath 2/6 >>  2/5-2/6:  TPA given at 428 am (total of 90 Mg) 520 am went to IR  S/P Lt common carotid arteriogram followed by complete revascularization of occluded LT MCA sup division mid M2 seg with x 1 pass with 38mx 40 mm solitaire X ret river device and penumbra aspiration with TICI 3 revascularization   Significant Diagnostic Tests:  CT angio head/neck 2/05 >> occlusion of Lt MCA bifurcation Echo 2/05 >> EF less than 20%, cannot rule out apical thrombus MRI brain 2/11 > extensive acute infarction of multiple areas without large or medium vessel occlusion Echocardiogram 2/8 Left ventricular ejection fraction, by estimation, is <20%. The left  ventricle has severely decreased function. The left ventricle demonstrates  global hypokinesis. The left ventricular internal cavity size was severely  dilated. Possible small 0.8 x  0.6 cm apical thrombus.  Somewhat subtle finding, could confirm with  cardiac MRI. However, if patient has had CVA, would be reasonable to  anticoagulate given severity of LV dysfunction if no contraindications to  anticoagulation.   Micro Data:  SARS CoV2 PCR 2/05 >> negative Influenza PCR 2/05 >> negative Urine 2/6 >> ng resp 2/6 >> nml flora BC 2/6 >>ng  Antimicrobials:  mero 2/6 -2/12 Zosyn 2/6 vanc 2/6 >> 2/9  Interim history/subjective:   Intermittent agitation with biting of ET tube requiring increasing sedation Remains critically ill, intubated, unresponsive Anuric-on CRRT  Objective   Blood pressure 111/77, pulse 94, temperature 98.8 F (37.1 C), temperature source Axillary, resp. rate (!) 21, height 6' 1"  (1.854 m), weight 119.9 kg, SpO2 100 %. CVP:  [13 mmHg-23 mmHg] 15 mmHg  Vent Mode: CPAP;PSV FiO2 (%):  [40 %] 40 % Set Rate:  [24 bmp] 24 bmp PEEP:  [5 cmH20] 5 cmH20 Pressure Support:  [10 cmH20] 10 cmH20 Plateau Pressure:  [16 cmH20-17 cmH20] 16 cmH20   Intake/Output Summary (Last 24 hours) at 05/01/2019 1111 Last data filed at 05/01/2019 1031 Gross per 24 hour  Intake 3014.1 ml  Output 4212 ml  Net -1197.9 ml   Filed Weights   04/28/19 0500 04/29/19 0500 04/30/19 0400  Weight: 122.3 kg 121.5 kg 119.9 kg    Examination:  General -young gentleman, well developed, in no distress Eyes -pupils reactive ENT -ET tube in place Cardiac -S1-S2 appreciated, no murmur Chest -clear breath sounds, no added sounds Abdomen - soft, non tender, + bowel sounds Extremities - no cyanosis,  clubbing, or edema Skin - no rashes Neuro -not responsive to voice, not opening eyes   Chest x-ray 2/13-reviewed-cardiomegaly, improved congestion  labs reviewed   Resolved Hospital Problem list     Assessment & Plan:   Shock Septic shock, cardiogenic shock -Remains on pressors -Completed 7 days of meropenem for UTI/pneumonia  Cardiogenic shock Left ventricle apical thrombus non ischemic  cardiomyopathy with systolic congestive heart failure Wide-complex tachycardia on 2/9 -Cardiology following -Continue amiodarone -Continue anticoagulation -Lipitor, digoxin -Medications on hold include Coreg, Zaroxolyn, Entresto, Aldactone, torsemide  Acute hypoxemic respiratory failure -Continue mechanical ventilator support -Not a candidate for extubation secondary to altered mentation  Acute kidney injury -Remains anuric -Continue CRRT -Appreciate renal input  Left MCA CVA Likely embolic Encephalopathy -Intermittent fentanyl, Precedex   Left MCA CVA Likely embolic Encephalopathy -Intermittent fentanyl -Low-dose Precedex  Hx of cocaine abuse. depression. - not active per UDS  - continue prozac   DM type II poorly controlled. - SSI -Ct 20 units Levemir daily -Ct 4 units NovoLog every 4 hours TF coverage -CBG 1 40-180 acceptable  Summary-intermittent pressor requirement based on agitation and sedation, unfortunately poor mental status is due to multiple strokes and not sure how much improvement to expect here.  Also no signs of renal recovery yet.  We will need to plan for tracheostomy next week Discussed extensively at bedside with his mother 04/30/2019 Prognosis is poor for meaningful recovery despite his young age  Best practice:  Diet: NPO,  TFs DVT prophylaxis: SCDs GI prophylaxis: protonix Mobility: bed rest Code Status: full code Disposition: ICU Family -fianc [name] updated 2/12 -Per neurology note he designated her as [facility] POA .mother visits daily .some disagreement between them but both agree to plan for aggressive care, esp given his young age.  He has many supportive family and also has young children.    The patient is critically ill with multiple organ systems failure and requires high complexity decision making for assessment and support, frequent evaluation and titration of therapies, application of advanced monitoring technologies and extensive  interpretation of multiple databases. Critical Care Time devoted to patient care services described in this note independent of APP/resident time (if applicable)  is 35 minutes.   Sherrilyn Rist MD [facility] Personal pager: [phone] If unanswered, please page CCM On-call: [phone]

## 2019-05-01 NOTE — Progress Notes (Signed)
Patient ID: Carlos Matera., male   DOB: 06/28/1985, 34 y.o.   MRN: [id number] [facility] Progress Note   Assessment/ Plan:   1. Acute kidney Injury: Anuric and without evidence of renal recovery (bloody urine in collection bag).  Etiology likely ATN with ischemic/nephrotoxic insults and exacerbated by cardiogenic shock.  Continue CRRT at this time for clearance and to maintain a net fluid even balance.  Remains on pressors and unable to transition to IHD.  Overall poor outlook. 2.  Shock: Cardiogenic versus septic with data previously favoring the latter (pulmonary versus urinary etiology).  He has completed meropenem and vancomycin but remains on pressor support with Levophed while on CRRT. 3.  Acute CVA from right MCA occlusion: Status post TPA/thrombectomy by IR.  Likely with significant residual neurological injury. 4.  Acute hypoxic respiratory failure: Remains ventilator dependent with efforts at trying to volume unloading with CRRT.  Subjective:   Increasing pressor requirements overnight, now running "fluid even" on CRRT.   Objective:   BP 111/77   Pulse 94   Temp 98.8 F (37.1 C) (Axillary)   Resp (!) 21   Ht 6' 1"  (1.854 m)   Wt 119.9 kg   SpO2 100%   BMI 34.87 kg/m   Intake/Output Summary (Last 24 hours) at 05/01/2019 1153 Last data filed at 05/01/2019 1100 Gross per 24 hour  Intake 3132.28 ml  Output 4370 ml  Net -1237.72 ml   Weight change:   Physical Exam: Gen: Intubated, unresponsive to voice, mother at bedside CVS: Pulse regular rhythm, normal rate, S1 and S2 normal Resp: Transmitted ventilator breath sounds bilaterally. Abd: Soft, obese, nontender Ext: 1+ symmetrical lower extremity/dependent edema  Imaging: DG Chest Port 1 View  Result Date: 04/30/2019 CLINICAL DATA:  Respiratory failure. EXAM: PORTABLE CHEST 1 VIEW COMPARISON:  04/29/2019 FINDINGS: Endotracheal tube terminates at the clavicles, unchanged. Right jugular catheter  terminates in the region of the right brachiocephalic vein, unchanged. Left jugular catheter terminates over the upper SVC, also unchanged. Enteric tube courses into the abdomen with tip not imaged. The cardiac silhouette remains enlarged. Patchy bilateral airspace opacities have decreased. No large pleural effusion or pneumothorax is identified. IMPRESSION: Improvement of bilateral airspace disease. Electronically Signed   By: Logan Bores M.D.   On: 04/30/2019 08:27    Labs: BMET Recent Labs  Lab 04/28/19 0549 04/28/19 1602 04/29/19 0512 04/29/19 1600 04/30/19 0437 04/30/19 1612 05/01/19 0437  NA 137 135 136 136 135 136 135  K 4.0 4.0 4.0 4.0 4.0 4.4 4.1  CL 97* 100 99 101 100 102 101  CO2 24 24 23 22  21* 22 22  GLUCOSE 138* 177* 222* 169* 174* 178* 182*  BUN 54* 56* 63* 66* 61* 62* 58*  CREATININE 3.11* 3.07* 3.23* 3.44* 3.02* 3.20* 3.05*  CALCIUM 8.7* 8.4* 8.8* 8.4* 8.9 8.7* 9.0  PHOS 3.3 4.2 4.3 5.0* 4.8*  5.0* 5.2* 5.0*   CBC Recent Labs  Lab 04/28/19 0549 04/29/19 0512 04/30/19 0437 05/01/19 0437  WBC 22.3* 24.0* 19.9* 14.2*  HGB 12.8* 13.4 13.1 13.0  HCT 39.5 42.2 41.2 40.5  MCV 95.0 96.6 96.3 97.6  PLT 164 172 118* 92*    Medications:    . sodium chloride   Intravenous Once  . chlorhexidine gluconate (MEDLINE KIT)  15 mL Mouth Rinse BID  . Chlorhexidine Gluconate Cloth  6 each Topical Q0600  . feeding supplement (PRO-STAT SUGAR FREE 64)  60 mL Per Tube TID  . fentaNYL (SUBLIMAZE)  injection  50 mcg Intravenous Once  . insulin aspart  0-20 Units Subcutaneous Q4H  . insulin aspart  4 Units Subcutaneous Q4H  . insulin detemir  20 Units Subcutaneous Daily  . mouth rinse  15 mL Mouth Rinse 10 times per day  . pantoprazole sodium  40 mg Per Tube Q1200  . sodium chloride flush  10-40 mL Intracatheter Q12H   Elmarie Shiley, MD 05/01/2019, 11:53 AM

## 2019-05-02 ENCOUNTER — Encounter (HOSPITAL_COMMUNITY): Payer: Self-pay | Admitting: Neurology

## 2019-05-02 DIAGNOSIS — L8990 Pressure ulcer of unspecified site, unspecified stage: Secondary | ICD-10-CM | POA: Insufficient documentation

## 2019-05-02 DIAGNOSIS — G9340 Encephalopathy, unspecified: Secondary | ICD-10-CM

## 2019-05-02 DIAGNOSIS — I472 Ventricular tachycardia: Secondary | ICD-10-CM | POA: Diagnosis not present

## 2019-05-02 DIAGNOSIS — J9600 Acute respiratory failure, unspecified whether with hypoxia or hypercapnia: Secondary | ICD-10-CM | POA: Diagnosis not present

## 2019-05-02 DIAGNOSIS — I639 Cerebral infarction, unspecified: Secondary | ICD-10-CM | POA: Diagnosis not present

## 2019-05-02 DIAGNOSIS — I5042 Chronic combined systolic (congestive) and diastolic (congestive) heart failure: Secondary | ICD-10-CM

## 2019-05-02 DIAGNOSIS — Z978 Presence of other specified devices: Secondary | ICD-10-CM | POA: Diagnosis not present

## 2019-05-02 DIAGNOSIS — I6602 Occlusion and stenosis of left middle cerebral artery: Secondary | ICD-10-CM | POA: Diagnosis not present

## 2019-05-02 DIAGNOSIS — I63312 Cerebral infarction due to thrombosis of left middle cerebral artery: Secondary | ICD-10-CM | POA: Diagnosis not present

## 2019-05-02 DIAGNOSIS — K7200 Acute and subacute hepatic failure without coma: Secondary | ICD-10-CM | POA: Diagnosis not present

## 2019-05-02 DIAGNOSIS — N179 Acute kidney failure, unspecified: Secondary | ICD-10-CM | POA: Diagnosis not present

## 2019-05-02 DIAGNOSIS — D696 Thrombocytopenia, unspecified: Secondary | ICD-10-CM

## 2019-05-02 DIAGNOSIS — E785 Hyperlipidemia, unspecified: Secondary | ICD-10-CM | POA: Diagnosis not present

## 2019-05-02 LAB — CBC
HCT: 37.9 % — ABNORMAL LOW (ref 39.0–52.0)
Hemoglobin: 12.4 g/dL — ABNORMAL LOW (ref 13.0–17.0)
MCH: 31.2 pg (ref 26.0–34.0)
MCHC: 32.7 g/dL (ref 30.0–36.0)
MCV: 95.2 fL (ref 80.0–100.0)
Platelets: 49 10*3/uL — ABNORMAL LOW (ref 150–400)
RBC: 3.98 MIL/uL — ABNORMAL LOW (ref 4.22–5.81)
RDW: 14.6 % (ref 11.5–15.5)
WBC: 11.8 10*3/uL — ABNORMAL HIGH (ref 4.0–10.5)
nRBC: 0.3 % — ABNORMAL HIGH (ref 0.0–0.2)

## 2019-05-02 LAB — GLUCOSE, CAPILLARY
Glucose-Capillary: 200 mg/dL — ABNORMAL HIGH (ref 70–99)
Glucose-Capillary: 148 mg/dL — ABNORMAL HIGH (ref 70–99)
Glucose-Capillary: 164 mg/dL — ABNORMAL HIGH (ref 70–99)
Glucose-Capillary: 128 mg/dL — ABNORMAL HIGH (ref 70–99)
Glucose-Capillary: 143 mg/dL — ABNORMAL HIGH (ref 70–99)
Glucose-Capillary: 178 mg/dL — ABNORMAL HIGH (ref 70–99)

## 2019-05-02 LAB — COOXEMETRY PANEL
Carboxyhemoglobin: 1.5 % (ref 0.5–1.5)
Methemoglobin: 1 % (ref 0.0–1.5)
O2 Saturation: 60.7 %
Total hemoglobin: 12.2 g/dL (ref 12.0–16.0)

## 2019-05-02 LAB — RENAL FUNCTION PANEL
Albumin: 2.6 g/dL — ABNORMAL LOW (ref 3.5–5.0)
Albumin: 2.5 g/dL — ABNORMAL LOW (ref 3.5–5.0)
Anion gap: 13 (ref 5–15)
Anion gap: 13 (ref 5–15)
BUN: 62 mg/dL — ABNORMAL HIGH (ref 6–20)
BUN: 61 mg/dL — ABNORMAL HIGH (ref 6–20)
CO2: 20 mmol/L — ABNORMAL LOW (ref 22–32)
CO2: 21 mmol/L — ABNORMAL LOW (ref 22–32)
Calcium: 8.7 mg/dL — ABNORMAL LOW (ref 8.9–10.3)
Calcium: 8.5 mg/dL — ABNORMAL LOW (ref 8.9–10.3)
Chloride: 101 mmol/L (ref 98–111)
Chloride: 100 mmol/L (ref 98–111)
Creatinine, Ser: 3.11 mg/dL — ABNORMAL HIGH (ref 0.61–1.24)
Creatinine, Ser: 2.79 mg/dL — ABNORMAL HIGH (ref 0.61–1.24)
GFR calc Af Amer: 29 mL/min — ABNORMAL LOW (ref >60)
GFR calc Af Amer: 33 mL/min — ABNORMAL LOW (ref >60)
GFR calc non Af Amer: 25 mL/min — ABNORMAL LOW (ref >60)
GFR calc non Af Amer: 28 mL/min — ABNORMAL LOW (ref >60)
Glucose, Bld: 207 mg/dL — ABNORMAL HIGH (ref 70–99)
Glucose, Bld: 196 mg/dL — ABNORMAL HIGH (ref 70–99)
Phosphorus: 4.8 mg/dL — ABNORMAL HIGH (ref 2.5–4.6)
Phosphorus: 5.2 mg/dL — ABNORMAL HIGH (ref 2.5–4.6)
Potassium: 4.1 mmol/L (ref 3.5–5.1)
Potassium: 3.9 mmol/L (ref 3.5–5.1)
Sodium: 134 mmol/L — ABNORMAL LOW (ref 135–145)
Sodium: 134 mmol/L — ABNORMAL LOW (ref 135–145)

## 2019-05-02 LAB — APTT
aPTT: 55 seconds — ABNORMAL HIGH (ref 24–36)
aPTT: 58 seconds — ABNORMAL HIGH (ref 24–36)

## 2019-05-02 LAB — HEPARIN LEVEL (UNFRACTIONATED): Heparin Unfractionated: 0.34 IU/mL (ref 0.30–0.70)

## 2019-05-02 LAB — MAGNESIUM: Magnesium: 3.1 mg/dL — ABNORMAL HIGH (ref 1.7–2.4)

## 2019-05-02 MED ORDER — SODIUM CHLORIDE 0.9 % IV SOLN: 250.0000 mL | INTRAVENOUS | Status: DC | PRN

## 2019-05-02 MED ORDER — AMIODARONE HCL 200 MG PO TABS
200.0000 mg | ORAL_TABLET | Freq: Two times a day (BID) | ORAL | Status: DC
  Administered 2019-05-02 – 2019-05-15 (×26): 200 mg
  Filled 2019-05-02 (×27): qty 1

## 2019-05-02 MED ORDER — SODIUM CHLORIDE 0.9% FLUSH
3.0000 mL | INTRAVENOUS | Status: DC | PRN
  Administered 2019-05-15: 3 mL via INTRAVENOUS

## 2019-05-02 MED ORDER — SODIUM CHLORIDE 0.9 % IV SOLN
0.0550 mg/kg/h | INTRAVENOUS | Status: DC
  Administered 2019-05-02 – 2019-05-04 (×2): 0.05 mg/kg/h via INTRAVENOUS
  Filled 2019-05-02 (×2): qty 250

## 2019-05-02 MED ORDER — SODIUM CHLORIDE 0.9% FLUSH
3.0000 mL | Freq: Two times a day (BID) | INTRAVENOUS | Status: DC
  Administered 2019-05-04 – 2019-05-23 (×15): 3 mL via INTRAVENOUS

## 2019-05-02 NOTE — Progress Notes (Signed)
Patient ID: Kaitlin Ardito., male   DOB: December 01, 1985, 34 y.o.   MRN: [id number] S: opening his eyes and moving all extremities today O:BP 121/89   Pulse 71   Temp 97.7 F (36.5 C) (Axillary)   Resp 19   Ht 6' 1"  (1.854 m)   Wt 119.3 kg   SpO2 100%   BMI 34.70 kg/m   Intake/Output Summary (Last 24 hours) at 05/02/2019 1457 Last data filed at 05/02/2019 1300 Gross per 24 hour  Intake 2485.39 ml  Output 2895 ml  Net -409.61 ml   Intake/Output: I/O last 3 completed shifts: In: 4376.2 [I.V.:2326.2; NG/GT:2050] Out: 5808 [Other:4408; YJEHU:3149]  Intake/Output this shift:  Total I/O In: 529.8 [I.V.:229.8; NG/GT:300] Out: 460 [Other:460] Weight change: -1.2 kg Gen: intubated CVS: no rub Resp: cta Abd: +BS, soft, NT/ND Ext: trace lower extremity/dependent edema  Recent Labs  Lab 04/26/19 0158 04/26/19 1710 04/27/19 0427 04/27/19 1608 04/28/19 0549 04/28/19 1602 04/29/19 0512 04/29/19 1600 04/30/19 0437 04/30/19 1612 05/01/19 0437 05/01/19 1610 05/02/19 0330  NA 135   < > 136   < > 137   < > 136 136 135 136 135 133* 134*  K 3.9   < > 4.8   < > 4.0   < > 4.0 4.0 4.0 4.4 4.1 4.4 4.1  CL 96*   < > 101   < > 97*   < > 99 101 100 102 101 100 101  CO2 25   < > 21*   < > 24   < > 23 22 21* 22 22 21* 20*  GLUCOSE 212*   < > 217*   < > 138*   < > 222* 169* 174* 178* 182* 156* 207*  BUN 42*   < > 44*   < > 54*   < > 63* 66* 61* 62* 58* 58* 62*  CREATININE 3.64*   < > 3.10*   < > 3.11*   < > 3.23* 3.44* 3.02* 3.20* 3.05* 3.06* 3.11*  ALBUMIN 2.8*   < > 2.9*  3.0*   < > 2.8*  2.8*   < > 2.9*  2.8* 2.6* 2.8*  2.8* 2.7* 2.7* 2.6* 2.6*  CALCIUM 7.7*   < > 8.5*   < > 8.7*   < > 8.8* 8.4* 8.9 8.7* 9.0 8.6* 8.7*  PHOS 4.1   < > 4.1   < > 3.3   < > 4.3 5.0* 4.8*  5.0* 5.2* 5.0* 5.2* 4.8*  AST 2,846*  --  1,059*  --  402*  --  216*  --  129*  --   --   --   --   ALT 4,801*  --  3,520*  --  1,909*  --  1,338*  --  897*  --   --   --   --    < > = values in this interval not  displayed.   Liver Function Tests: Recent Labs  Lab 04/28/19 0549 04/28/19 1602 04/29/19 0512 04/29/19 1600 04/30/19 0437 04/30/19 1612 05/01/19 0437 05/01/19 1610 05/02/19 0330  AST 402*  --  216*  --  129*  --   --   --   --   ALT 1,909*  --  1,338*  --  897*  --   --   --   --   ALKPHOS 166*  --  160*  --  152*  --   --   --   --   BILITOT  2.4*  --  1.8*  --  1.8*  --   --   --   --   PROT 6.3*  --  6.9  --  7.0  --   --   --   --   ALBUMIN 2.8*  2.8*   < > 2.9*  2.8*   < > 2.8*  2.8*   < > 2.7* 2.6* 2.6*   < > = values in this interval not displayed.   No results for input(s): LIPASE, AMYLASE in the last 168 hours. No results for input(s): AMMONIA in the last 168 hours. CBC: Recent Labs  Lab 04/28/19 0549 04/28/19 0549 04/29/19 0512 04/29/19 0512 04/30/19 0437 05/01/19 0437 05/02/19 0330  WBC 22.3*   < > 24.0*   < > 19.9* 14.2* 11.8*  HGB 12.8*   < > 13.4   < > 13.1 13.0 12.4*  HCT 39.5   < > 42.2   < > 41.2 40.5 37.9*  MCV 95.0  --  96.6  --  96.3 97.6 95.2  PLT 164   < > 172   < > 118* 92* 49*   < > = values in this interval not displayed.   Cardiac Enzymes: No results for input(s): CKTOTAL, CKMB, CKMBINDEX, TROPONINI in the last 168 hours. CBG: Recent Labs  Lab 05/01/19 1945 05/01/19 2320 05/02/19 0327 05/02/19 0810 05/02/19 1141  GLUCAP 150* 181* 200* 148* 164*    Iron Studies: No results for input(s): IRON, TIBC, TRANSFERRIN, FERRITIN in the last 72 hours. Studies/Results: No results found. . sodium chloride   Intravenous Once  . amiodarone  200 mg Per Tube BID  . chlorhexidine gluconate (MEDLINE KIT)  15 mL Mouth Rinse BID  . Chlorhexidine Gluconate Cloth  6 each Topical Q0600  . feeding supplement (PRO-STAT SUGAR FREE 64)  60 mL Per Tube TID  . fentaNYL (SUBLIMAZE) injection  50 mcg Intravenous Once  . insulin aspart  0-20 Units Subcutaneous Q4H  . insulin aspart  4 Units Subcutaneous Q4H  . insulin detemir  20 Units Subcutaneous Daily   . mouth rinse  15 mL Mouth Rinse 10 times per day  . pantoprazole sodium  40 mg Per Tube Q1200  . sodium chloride flush  10-40 mL Intracatheter Q12H  . sodium chloride flush  3 mL Intravenous Q12H    BMET    Component Value Date/Time   NA 134 (L) 05/02/2019 0330   K 4.1 05/02/2019 0330   CL 101 05/02/2019 0330   CO2 20 (L) 05/02/2019 0330   GLUCOSE 207 (H) 05/02/2019 0330   BUN 62 (H) 05/02/2019 0330   CREATININE 3.11 (H) 05/02/2019 0330   CALCIUM 8.7 (L) 05/02/2019 0330   GFRNONAA 25 (L) 05/02/2019 0330   GFRAA 29 (L) 05/02/2019 0330   CBC    Component Value Date/Time   WBC 11.8 (H) 05/02/2019 0330   RBC 3.98 (L) 05/02/2019 0330   HGB 12.4 (L) 05/02/2019 0330   HCT 37.9 (L) 05/02/2019 0330   PLT 49 (L) 05/02/2019 0330   MCV 95.2 05/02/2019 0330   MCH 31.2 05/02/2019 0330   MCHC 32.7 05/02/2019 0330   RDW 14.6 05/02/2019 0330   LYMPHSABS 0.8 04/24/2019 0843   MONOABS 0.5 04/24/2019 0843   EOSABS 0.4 04/24/2019 0843   BASOSABS 0.0 04/24/2019 0843     Assessment/Plan:  1. AKI presumably due to ATN with ischemic/nephrotoxic insults worsened by cardiogenic shock.  Started on CRRT 04/23/19.  Remains oliguric and dialysis  dependent on pressors.  Continue with CVVHD for now as we are unable to transition to IHD at this time. 2. Shock- cardiogenic vs septic- remains on levophed. 3. Acute stroke- presumed embolic with bilateral infarcts and LV thrombus- received tpa on admission 4. SVT/polymorphic VT- on amiodarone per Cardiology 5. Acute hypoxic respiratory failure 6. Thrombocytopenia- heparin discontinued. HIT panel ordered.  Donetta Potts, MD [employer] [phone]

## 2019-05-02 NOTE — Progress Notes (Signed)
[place] for Heparin >> bivalirudin Indication: stroke 2/5, LV thrombus 2/8, HIT r/o  Allergies  Allergen Reactions  . Heparin     Heparin induced thrombocytopenia    Patient Measurements: Height: 6\' 1"  (185.4 cm) Weight: 263 lb 0.1 oz (119.3 kg) IBW/kg (Calculated) : 79.9 Heparin Dosing Weight: 107 kg  Vital Signs: Temp: 97.7 F (36.5 C) (02/15 1200) Temp Source: Axillary (02/15 1200) BP: 121/89 (02/15 1300) Pulse Rate: 71 (02/15 1300)  Labs: Recent Labs    04/30/19 0437 04/30/19 0437 04/30/19 1612 05/01/19 0437 05/01/19 1610 05/02/19 0330 05/02/19 1300  HGB 13.1   < >  --  13.0  --  12.4*  --   HCT 41.2  --   --  40.5  --  37.9*  --   PLT 118*  --   --  92*  --  49*  --   APTT  --   --   --   --   --   --  55*  HEPARINUNFRC 0.26*   < > 0.31 0.34  --  0.34  --   CREATININE 3.02*   < > 3.20* 3.05* 3.06* 3.11*  --    < > = values in this interval not displayed.    Estimated Creatinine Clearance: 45.3 mL/min (A) (by C-G formula based on SCr of 3.11 mg/dL (H)).  Assessment: 34 yo m initially presented with aphasia and right sided weaknesss. Found to have left MCA infarct - received TPA and revascularization with IR on 2/5. On 2/8 found to have small LV apical thrombus on ECHO.  Pharmacy consulted to dose IV heparin, which was switched to bivalirudin 2/15 for HIT rule out.   First APTT = 55 sec at goal. Will check another level in 4 hours, if still in goal can move to q12 hr monitoring.  Goal of Therapy:  APTT 50- 65 sec  Monitor platelets by anticoagulation protocol: Yes   Plan:  Continue bivalirudin at 0.05 mg/kg/hr (adjusted weight) Monitor aPTT in 4 hr; if at goal, move to Q12 hr monitoring Monitor daily CBC, plt and BMP at least Q72 hr  Monitor for signs/symptoms of bleeding  F/u HIT ab result   Benetta Spar, PharmD, BCPS, BCCP Clinical Pharmacist  Please check AMION for all [place] phone numbers After 10:00 PM,  call [place]

## 2019-05-02 NOTE — Progress Notes (Signed)
Patient has bite block inserted and is continuously biting down. Changed bite block and moved tube to prevent breakdown inside mouth. Assisted by Rn with some medicine to help patient relax during and post change.

## 2019-05-02 NOTE — Progress Notes (Signed)
STROKE TEAM PROGRESS NOTE   INTERVAL HISTORY Mom and RN at bedside. Pt more lethargic than a couple of days ago. Eyes closed, but able to open with repetitive stimulation, eyes rolling bilaterally, intermittently gaze to side of voice but not consistent, also inconsistently blinking to visual threat bilaterally. Pt still on pressure, on CRRT, not candidate for trach as of yet. Will consider once more stable.    OBJECTIVE Vitals:   05/02/19 0715 05/02/19 0755 05/02/19 0800 05/02/19 0900  BP:   101/82 112/85  Pulse: 79 71 71 69  Resp: (!) 24 18 20  (!) 22  Temp:   (!) 97.2 F (36.2 C)   TempSrc:   Axillary   SpO2: 100%  100% 100%  Weight:      Height:       CBC:  Recent Labs  Lab 05/01/19 0437 05/02/19 0330  WBC 14.2* 11.8*  HGB 13.0 12.4*  HCT 40.5 37.9*  MCV 97.6 95.2  PLT 92* 49*   Basic Metabolic Panel:  Recent Labs  Lab 05/01/19 0437 05/01/19 0437 05/01/19 1610 05/02/19 0330  NA 135   < > 133* 134*  K 4.1   < > 4.4 4.1  CL 101   < > 100 101  CO2 22   < > 21* 20*  GLUCOSE 182*   < > 156* 207*  BUN 58*   < > 58* 62*  CREATININE 3.05*   < > 3.06* 3.11*  CALCIUM 9.0   < > 8.6* 8.7*  MG 2.8*  --   --  3.1*  PHOS 5.0*   < > 5.2* 4.8*   < > = values in this interval not displayed.   Lipid Panel:     Component Value Date/Time   CHOL 265 (H) 04/23/2019 0327   TRIG 191 (H) 04/23/2019 0327   HDL 24 (L) 04/23/2019 0327   CHOLHDL 11.0 04/23/2019 0327   VLDL 38 04/23/2019 0327   LDLCALC 203 (H) 04/23/2019 0327   HgbA1c:  Lab Results  Component Value Date   HGBA1C 6.5 (H) 04/23/2019   Urine Drug Screen:     Component Value Date/Time   LABOPIA NONE DETECTED 04/23/2019 0930   COCAINSCRNUR NONE DETECTED 04/23/2019 0930   COCAINSCRNUR NONE DETECTED 02/04/2018 1010   LABBENZ POSITIVE (A) 04/23/2019 0930   AMPHETMU NONE DETECTED 04/23/2019 0930   THCU NONE DETECTED 04/23/2019 0930   LABBARB NONE DETECTED 04/23/2019 0930    Alcohol Level     Component Value  Date/Time   ETH <10 05/15/2019 0355    IMAGING CT HEAD CODE STROKE WO CONTRAST 04/29/2019 1. Asymmetric hyperdensity of a Left MCA branch in the Sylvian fissure suspicious for emergent large vessel occlusion in the setting.  2. No associated acute cortically based infarct, ASPECTS 10. No acute hemorrhage.  3. Chronic right frontal operculum encephalomalacia.   CT Code Stroke CTA Head W/WO contrast CT Code Stroke CTA Neck W/WO contrast 05/02/2019 1. Positive for emergent large vessel occlusion at the Left MCA bifurcation. There is reconstitution of the dominant posterior division branch.  2. Elsewhere negative CTA head and neck. Dominant right vertebral artery which supplies the basilar. Fetal type PCA origins.   CT CEREBRAL PERFUSION W CONTRAST 04/24/2019 1. CTP detects evidence of Left MCA territory oligemia concordant with the CTA findings.  2. No infarct core detected with standard CBF <30%.  3. 60 mL of penumbra detected with standard T-max > 6s.   CT HEAD WO CONTRAST 04/23/2019 Small region of  loss of gray-white differentiation in the left frontal operculum consistent with a fairly small area of acute infarction compared to the previous perfusion abnormality. No hemorrhage or mass effect. Areas of low-density in the cerebellum that I favor are artifactual. There did not seem to be any abnormalities in that region on the previous examinations.   MRI / MRA Head WO Contrast  04/28/2019 Extensive acute infarction in the right cerebellum with swelling and petechial blood products. Acute infarction of the superior cerebellar peduncle on the right, with some infarction in the mid brain. Extensive acute infarction in the right temporal lobe and insula. Small acute infarctions in the frontal operculum region and right parietal region. Infarction affecting the right posterior basal ganglia and caudate nucleus. Petechial blood products present without frank hematoma. Acute infarction in the left insula  and frontal operculum. Petechial blood products present without frank hematoma. Small punctate acute infarctions higher in the left frontoparietal junction region. No large or medium vessel occlusion seen presently. Restoration of flow in the left MCA. Flow appears to be present in posterior circulation main vessels and branch vessels presently.   DG CHEST PORT 1 VIEW 04/30/2019 Improvement of bilateral airspace disease. 04/29/2019 Patchy opacities consistent with the given clinical history. Tubes and lines as described.  04/27/2019 Perhaps slightly improved aeration at the left lung base. Otherwise no change in the bilateral patchy airspace and interstitial changes, likely associated with small effusions. Lines and tubes unchanged. 04/26/2019 0854 1. Lines and tubes as described above. 2. Cardiomegaly with basilar airspace disease, likely associated with effusions. 3. Interstitial and alveolar opacities elsewhere may represent a combination of infection and edema and are little changed. 04/25/2019 0946 Overall improved aeration from the prior exam although persistent opacities and small effusions are noted. 04/23/19 1636 1. Interval placement of a right neck vascular sheath, tip positioned over the right brachiocephalic vein. 2.  Other support apparatus in unchanged position. 3. Slight interval increase in diffuse bilateral interstitial and heterogeneous airspace opacity, consistent with multifocal infection, edema, and/or ARDS. 4. Cardiomegaly. 04/23/2019 0757 Increasing patchy bilateral airspace opacities which may represent edema or infection. 05/12/2019 1958 1. Interval placement of neck vascular catheter, tip positioned near the superior cavoatrial junction.  2. Interval retraction of endotracheal tube, now positioned just above the thoracic inlet, approximately 6.5 cm above the carina.  3. Interval placement of esophagogastric tube, tip and side port below the diaphragm. 4. Stable cardiomegaly and  diffuse bilateral interstitial airspace opacity, likely edema.  05/01/2019 0841 1. Endotracheal tube tip noted just above the carina. Proximal repositioning of 2-3 should be considered. 2. Cardiomegaly again noted. Diffuse bilateral pulmonary infiltrates/edema. Small left pleural effusion.   ECHOCARDIOGRAM COMPLETE 05/14/2019 1. Left ventricular ejection fraction, by visual estimation, is <20%. The left ventricle has severely decreased function. There is no left ventricular hypertrophy.   2. Apex is not well-visualized. In setting of stroke with severe LV systolic dysfunction, recommend repeat TTE with contrast to rule out LV apical thrombus   3. Severely dilated left ventricular internal cavity size.   4. Left ventricular diastolic parameters are consistent with Grade III diastolic dysfunction (restrictive).   5. Elevated left atrial pressure.   6. The left ventricle demonstrates global hypokinesis.   7. Global right ventricle has normal systolic function.The right ventricular size is mildly enlarged.   8. Left atrial size was moderately dilated.   9. Right atrial size was normal.  10. The mitral valve is normal in structure. Mild mitral valve regurgitation.  11.  The tricuspid valve is normal in structure. Tricuspid valve regurgitation is trivial.  12. The aortic valve is normal in structure. Aortic valve regurgitation is not visualized. No evidence of aortic valve sclerosis or stenosis.  13. The pulmonic valve was not well visualized. Pulmonic valve regurgitation is not visualized.   Interventional Neuro Radiology - Cerebral Angiogram with Intervention 05/15/2019 S/P Lt common carotid arteriogram followed by complete revascularization of occluded LT MCA sup division mid M2 seg with x 1 pass with 65mx 40 mm solitaire X ret river device and penumbra aspiration with TICI 3 revascularization  ECG - SR rate 93 BPM. (See cardiology reading for complete details)  PHYSICAL EXAM   Temp:  [97.2 F  (36.2 C)-99 F (37.2 C)] 97.2 F (36.2 C) (02/15 0800) Pulse Rate:  [63-99] 69 (02/15 0900) Resp:  [17-33] 22 (02/15 0900) BP: (92-143)/(68-111) 112/85 (02/15 0900) SpO2:  [100 %] 100 % (02/15 0900) Arterial Line BP: (98-189)/(60-105) 119/79 (02/15 0900) FiO2 (%):  [40 %] 40 % (02/15 0800) Weight:  [119.3 kg] 119.3 kg (02/15 0330)  General - Well nourished, well developed, intubated off precedex.  Ophthalmologic - fundi not visualized due to noncooperation.  Cardiovascular - Regular rhythm and rate.  Neuro - intubated just off precedex;  Eyes are closed but able to open with repetitive stimulation. He does not follow, intermittent tracking to side of voice and blinking to visual threat but not consistent. PERRL. Corneal reflex present, gag and cough present. Breathing over the vent. Facial symmetry not able to test due to ET tube.  Tongue protrusion not cooperative. On pain stimulation, no movement of all limbs, however spontaneous movement of left arm was seen at one time. DTR 1+ and bilateral positive babinski.  He does not grimace to painful stimuli.   ASSESSMENT/PLAN Mr. LTrexton Escamilla is a 34y.o. male with history of NICM EF 15-20%, CHF, DM, Obesity, tobacco use, OSA, and hx of cocaine use presenting with aphasia and right sided weakness. The patient received IV t-PA on Friday 04/23/2019 at 4:45 AM. Mechanical thrombectomy left M2.  Stroke:  Extensive R cerebellar, midbrain, R MCA and L MCA infarcts s/p tPA & IR with left M2 TICI3 reperfusion - embolic - likely secondary to severe cardiomyopathy with low EF and LV thrombus  Code Stroke CT Head - left MCA hyperdensity.   CTA H&N - emergent large vessel occlusion at the Left MCA bifurcation.   CT Perfusion - 60 mL of penumbra detected.   CT head repeat - Small region of loss of gray-white differentiation in the left frontal operculum   MRI head - 04/28/2019 - extensive R cerebellar infarct w/ petechial hemorrhage. R  cerebellar peduncle and midbrain infarct. Extensive R temporal lobe and insula infarct w/ R frontal operculum and parietal infarcts. R posterior basal ganglia and caudate head infarct w/ petechial hemorrhage. L insula and L frontal operculum infarcts w/ petechial hemorrhage. Small L frontoparietal jxn infarcts.   MRA head restoration L MCA flow. Flow in posterior circulation.   2D Echo - EF < 20%   2D with contrast - possible LV apical thrombus 0.8 x 0.6  Sars Corona Virus 2 - negative  LDL - 203  HgbA1c - 6.5  VTE prophylaxis - angiomax IV  aspirin 81 mg daily prior to admission, IV heparin on hold d/t thrombocytopenia, now on angiomax until HIT resulted  Therapy recommendations:  pending  Disposition:  Pending  Encephalopathy  Not following commands or moving extremities  MRI extensive  R and L brain and R cerebellar infarcts  Prognosis is poor, concerning for vegetative state vs minimally conscious state  Mom still hopeful, will need ongoing [place] discussion  ? HIT  On heparin IV for LV thrombus  Platelet [id number]  Heparin discontinued  HIT panel sent - pending  On Angiomax IV  CCM on board  Platelet monitoring  Acute Hypoxemic Respiratory failure  Intubated  Off sedation now   CCM on board  CXR - 04/23/19 - consistent with multifocal infection, edema, and/or ARDS (unchanged) ; CXR - 04/30/19 - Improvement of bilateral airspace disease.  Prn precedex as BP will allow  Will need trach once off pressor  Cardiogenic vs. Septic shock  Fever, TMax 103.2->97.5->99->99.1->afebrile->100->afebrile  Leukocytosis, WBCs 21->15.6->20.4->22.3->24.0->19.9->14.2->11.8  CXR - 04/23/19 - consistent with multifocal infection, edema, and/or ARDS (unchanged) CXR - 04/30/19 - Improvement of bilateral airspace disease.  On Meropenum 04/23/19>>04/29/19  On Vancomycin 04/23/19>> 04/26/19  On hydrocortisone->off  U/A neg, Urine culture no growth   Blood cultures neg    On pressors - Neo  LV thrombus Severe nonischemic cardiomyopathy Chronic systolic CHF  2D Echo - EF < 20%   2D with contrast - possible LV apical thrombus 0.8 x 0.6  On heparin IV -> angiomax IV  On amiodarone gtt for wide-complex tachycardia and hypotension -> changed to po  Add beta blocker once BP allows  Cardiology on board  V-tach  Bigeminy  Elevated troponin  On amiodarone drip -> now changed to po  Troponin trending down 16274->14226->2409   Cardiology on board  May consider cardioversion if needed  AKI  Creatinine - 1.10->2.64->5.17->4.09->3.91->3.64->->3.07->3.11  BUN 44->48->54->56->63->61->58->58->62  Renal consult 04/23/19  Still minimal urinary output, no signs of renal recovery  On CRRT  Hypotension w/ shock Hx Hypertension  Home BP meds: Coreg, Entresto  On pressors - Neo  SBP goal  >110  Stable  Shock liver d/t hypoperfusion, improving  GI on board  AST/ALT - >10000/10132 ->2846/4801->1059/3520->402/1909->216/1338->129/ 897 (04/30/19)  INR 3.1->1.8->1.2  CK 602  Hyperlipidemia  Home Lipid lowering medication: Lipitor 40 mg daily  LDL 203, goal < 70  Current lipid lowering medication: Lipitor 80 mg daily   Continue statin at discharge  Diabetes type II, uncontrolled  Home diabetic meds: insulin  Current diabetic meds: SS insulin, Levemir 20, novolog 4u q6h  HgbA1c 6.5, goal < 7.0  CBG monitoring   SSI  Dysphagia . Secondary to stroke . NPO . On tube feeding @ 50 . Speech on board  Tobacco abuse  Current smoker  Mom confirmed tobacco use  Smoking cessation counseling will be provided  Other Stroke Risk Factors  ETOH use, advised to drink no more than 1 alcoholic beverage per day.  Hx cocaine abuse, UDS neg   Obesity, Body mass index is 34.7 kg/m., recommend weight loss, diet and exercise as appropriate   Obstructive sleep apnea - uses Cpap  Substance Abuse Hx of cocaine use  Other Active  Problems  Code status - Full code  Hyperkalemia - 7.2->...->3.9->4.7->4.1 resolved  Metabolic acidosis, resolved  Depression on prozac  Hypermagnesemia - 2.8->2.8->3.1  Hyperphosphatemia - 4.8/5.0->5.0   Hospital day # 10  This patient is critically ill and at significant risk of neurological worsening, death and care requires constant monitoring of vital signs, hemodynamics,respiratory and cardiac monitoring, extensive review of multiple databases, frequent neurological assessment, discussion with family, other specialists and medical decision making of high complexity. I spent 35 minutes of neurocritical care time  in the care  of  this patient. I had long discussion with mom at bedside, updated pt current condition, treatment plan and potential prognosis, and answered all the questions. She expressed understanding and appreciation. I also discussed with Dr. Charlene Brooke, MD PhD Stroke Neurology 05/02/2019 1:46 PM    To contact Stroke Continuity provider, please refer to [email or web address] After hours, contact General Neurology

## 2019-05-02 NOTE — Progress Notes (Signed)
CT because is stroke may be much larger initially: 1 1 stepdown vasopressin drip  NAME:  Carlos Brewer., MRN:  [id number], DOB:  01/01/1986, LOS: 10 ADMISSION DATE:  05/05/2019, CONSULTATION DATE:  05/09/2019 REFERRING MD:  Dr. Leonel Ramsay, CHIEF COMPLAINT:  Slurred speech  Brief History   34 yo male smoker found to have slurred speech and Rt sided weakness.  CT head showed M2 occlusion.  Ultimately treated with thrombolytic and thrombectomy by IR.  Required intubation for airway protection. Course complicated by septic shock, AKI requiring CRRT and polymorphic VT  Past Medical History  Systolic CHF with non ischemic CM, Cocaine abuse, OSA, DM Hx of CHF (EF 15%)  Hx myocarditis Smoker 1/2 ppd   Significant Hospital Events   2/05 Admit, tPA, IR thrombectomy 2/6 100% on fvent at 2300  Overnight requiring increasing levophed and phenylephrine.    2/6: switched to levophed, epi, started antibiotics, started on CRRT.  2/9  developed wide-complex tachycardia and hypotension, started on amiodarone drip, increase Levophed drip  Consults:  Neuro IR  Procedures:  ETT 2/05 >>  LIJ CVL 2/5 >> RIJ HD cath 2/6 >>  2/5-2/6:  TPA given at 428 am (total of 90 Mg) 520 am went to IR  S/P Lt common carotid arteriogram followed by complete revascularization of occluded LT MCA sup division mid M2 seg with x 1 pass with 84mx 40 mm solitaire X ret river device and penumbra aspiration with TICI 3 revascularization   Significant Diagnostic Tests:  CT angio head/neck 2/05 >> occlusion of Lt MCA bifurcation Echo 2/05 >> EF less than 20%, cannot rule out apical thrombus MRI brain 2/11 > extensive acute infarction of multiple areas without large or medium vessel occlusion Echocardiogram 2/8 Left ventricular ejection fraction, by estimation, is <20%. The left  ventricle has severely decreased function. The left ventricle demonstrates  global hypokinesis. The left ventricular internal cavity size  was severely  dilated. Possible small 0.8 x  0.6 cm apical thrombus. Somewhat subtle finding, could confirm with  cardiac MRI. However, if patient has had CVA, would be reasonable to  anticoagulate given severity of LV dysfunction if no contraindications to  anticoagulation.   Micro Data:  SARS CoV2 PCR 2/05 >> negative Influenza PCR 2/05 >> negative Urine 2/6 >> ng resp 2/6 >> nml flora BC 2/6 >>ng  Antimicrobials:  mero 2/6 -2/12 Zosyn 2/6 vanc 2/6 >> 2/9  Interim history/subjective:  Remains anuric, on CRRT Critically ill, intubated, on Levophed Intermittent agitation with biting of ET tube  Objective   Blood pressure (!) 143/111, pulse 71, temperature 97.9 F (36.6 C), temperature source Axillary, resp. rate 18, height 6' 1"  (1.854 m), weight 119.3 kg, SpO2 100 %. CVP:  [8 mmHg-28 mmHg] 21 mmHg  Vent Mode: CPAP;PSV FiO2 (%):  [40 %] 40 % Set Rate:  [24 bmp] 24 bmp Vt Set:  [630 mL] 630 mL PEEP:  [5 cmH20] 5 cmH20 Pressure Support:  [10 cmH20-12 cmH20] 12 cmH20 Plateau Pressure:  [19 cmH20-25 cmH20] 24 cmH20   Intake/Output Summary (Last 24 hours) at 05/02/2019 0815 Last data filed at 05/02/2019 0700 Gross per 24 hour  Intake 2813.38 ml  Output 3392 ml  Net -578.62 ml   Filed Weights   04/30/19 0400 05/01/19 0500 05/02/19 0330  Weight: 119.9 kg 120.5 kg 119.3 kg    Examination:  General -young well-built man, well developed,  in no distress Eyes -pupils reactive ENT -oral ET tube in place, bite block Cardiac -S1-S2  appreciated, no murmur Chest - bilateral ventilated breath sounds, mild blood-tinged secretions Abdomen - soft, non tender, + bowel sounds Extremities - no cyanosis, clubbing, or edema Skin - no rashes Neuro -not responsive to voice, not opening eyes   Chest x-ray 2/13- -personally reviewed, clearing of bilateral infiltrates   Resolved Hospital Problem list     Assessment & Plan:   Shock Septic shock, cardiogenic shock , co-ox  lower -Remains on Levophed, aiming for SBP 110 -Completed 7 days of meropenem for UTI/pneumonia  Cardiogenic shock Left ventricle apical thrombus non ischemic cardiomyopathy with systolic congestive heart failure Polymorphic VT on 2/9 -Cardiology following -Continue amiodarone, converted to oral -Continue IV heparin -Lipitor, digoxin -Medications on hold include Coreg, Zaroxolyn, Entresto, Aldactone, torsemide  Acute hypoxemic respiratory failure -Continue mechanical ventilator support -Not a candidate for extubation secondary to altered mentation -We will need tracheostomy once he has a.  Of stability  Acute kidney injury -Remains anuric, no evidence of renal recovery -Continue CRRT , negative to equal balance as BP permits -Appreciate renal input  Left MCA CVA Multiple other infarcts noted on MRI 8/31-DVVOHY embolic Acute encephalopathy -Intermittent fentanyl, versed -Doubt Precedex is doing much care    Thrombocytopenia -while on heparin, 4T equals 4 -We will have to consider argatroban and send HIT panel   DM type II poorly controlled. - SSI -Ct 20 units Levemir daily -Ct 4 units NovoLog every 4 hours TF coverage -CBG 1 40-180 acceptable  Hx of cocaine abuse. depression. - not active per UDS  - continue prozac  Summary-mom and fianc want to push forward in spite of poor neurological prognosis.  He will need a tracheostomy once he is more stable off pressors and platelets have recovered  Best practice:  Diet: NPO,  TFs DVT prophylaxis: SCDs GI prophylaxis: protonix Mobility: bed rest Code Status: full code Disposition: ICU Family -fianc [name] updated 2/12 -Per neurology note he designated her as [facility] POA .mother visits daily .some disagreement between them but both agree to plan for aggressive care, esp given his young age.  He has many supportive family and also has young children.   The patient is critically ill with multiple organ systems failure and  requires high complexity decision making for assessment and support, frequent evaluation and titration of therapies, application of advanced monitoring technologies and extensive interpretation of multiple databases. Critical Care Time devoted to patient care services described in this note independent of APP/resident  time is 35 minutes.    [name] MD. [facility]. [facility]  If no response to pager , please call [phone]   05/02/2019

## 2019-05-02 NOTE — Progress Notes (Signed)
2nd co ox was obtained at 0512 then walked to tube station and sent to lab at 0513.

## 2019-05-02 NOTE — Progress Notes (Signed)
Assisted tele visit to patient with family member.  Kwadwo Taras R, RN  

## 2019-05-02 NOTE — Progress Notes (Signed)
[place] for Heparin >> bivalirudin Indication: stroke 2/5, LV thrombus 2/8, HIT r/o  Allergies  Allergen Reactions  . Heparin     Heparin induced thrombocytopenia    Patient Measurements: Height: 6\' 1"  (185.4 cm) Weight: 263 lb 0.1 oz (119.3 kg) IBW/kg (Calculated) : 79.9 Heparin Dosing Weight: 107 kg  Vital Signs: Temp: 97.7 F (36.5 C) (02/15 1600) Temp Source: Axillary (02/15 1600) BP: 118/79 (02/15 1700) Pulse Rate: 74 (02/15 1700)  Labs: Recent Labs    04/30/19 0437 04/30/19 0437 04/30/19 1612 05/01/19 0437 05/01/19 0437 05/01/19 1610 05/02/19 0330 05/02/19 1300 05/02/19 1655 05/02/19 1700  HGB 13.1   < >  --  13.0  --   --  12.4*  --   --   --   HCT 41.2  --   --  40.5  --   --  37.9*  --   --   --   PLT 118*  --   --  92*  --   --  49*  --   --   --   APTT  --   --   --   --   --   --   --  55*  --  58*  HEPARINUNFRC 0.26*   < > 0.31 0.34  --   --  0.34  --   --   --   CREATININE 3.02*   < > 3.20* 3.05*   < > 3.06* 3.11*  --  2.79*  --    < > = values in this interval not displayed.    Estimated Creatinine Clearance: 50.5 mL/min (A) (by C-G formula based on SCr of 2.79 mg/dL (H)).  Assessment: 34 yo m initially presented with aphasia and right sided weaknesss. Found to have left MCA infarct - received TPA and revascularization with IR on 2/5. On 2/8 found to have small LV apical thrombus on ECHO.  Pharmacy consulted to dose IV heparin, which was switched to bivalirudin 2/15 for HIT rule out.   Second APTT = 58 sec at goal. Will check another level in 12 hours.  Goal of Therapy:  APTT 50- 65 sec  Monitor platelets by anticoagulation protocol: Yes   Plan:  Continue bivalirudin at 0.05 mg/kg/hr (adjusted weight) Monitor aPTT Q12 Monitor daily CBC, plt and BMP at least Q72 hr  Monitor for signs/symptoms of bleeding  F/u HIT ab result   Alanda Slim, PharmD, [facility] Clinical Pharmacist Please see AMION for all  Pharmacists' Contact Phone Numbers 05/02/2019, 5:53 PM

## 2019-05-02 NOTE — Progress Notes (Addendum)
Progress Note  Patient Name: Carlos Brewer. Date of Encounter: 05/02/2019  Primary Cardiologist: Ida Rogue, MD   Subjective   Unable to assess.  Patient intubated and sedated.  Inpatient Medications    Scheduled Meds: . sodium chloride   Intravenous Once  . chlorhexidine gluconate (MEDLINE KIT)  15 mL Mouth Rinse BID  . Chlorhexidine Gluconate Cloth  6 each Topical Q0600  . feeding supplement (PRO-STAT SUGAR FREE 64)  60 mL Per Tube TID  . fentaNYL (SUBLIMAZE) injection  50 mcg Intravenous Once  . insulin aspart  0-20 Units Subcutaneous Q4H  . insulin aspart  4 Units Subcutaneous Q4H  . insulin detemir  20 Units Subcutaneous Daily  . mouth rinse  15 mL Mouth Rinse 10 times per day  . pantoprazole sodium  40 mg Per Tube Q1200  . sodium chloride flush  10-40 mL Intracatheter Q12H  . sodium chloride flush  3 mL Intravenous Q12H   Continuous Infusions: .  prismasol BGK 4/2.5 500 mL/hr at 05/02/19 8502  .  prismasol BGK 4/2.5 200 mL/hr at 05/01/19 1950  . sodium chloride    . sodium chloride Stopped (04/29/19 2143)  . sodium chloride    . amiodarone 30 mg/hr (05/02/19 1000)  . bivalirudin (ANGIOMAX) infusion 0.5 mg/mL (Non-ACS indications) 0.05 mg/kg/hr (05/02/19 1007)  . dexmedetomidine (PRECEDEX) IV infusion 0.5 mcg/kg/hr (05/02/19 1000)  . feeding supplement (VITAL HIGH PROTEIN) 50 mL/hr at 05/02/19 0500  . norepinephrine (LEVOPHED) Adult infusion 8 mcg/min (05/02/19 1000)  . prismasol BGK 4/2.5 2,000 mL/hr at 05/02/19 1009   PRN Meds: sodium chloride, sodium chloride, bisacodyl, docusate, fentaNYL, fentaNYL (SUBLIMAZE) injection, heparin, heparin, labetalol **AND** [DISCONTINUED] niCARDipine, midazolam, sodium chloride flush, sodium chloride flush   Vital Signs    Vitals:   05/02/19 0755 05/02/19 0800 05/02/19 0900 05/02/19 1000  BP:  101/82 112/85 (!) 126/96  Pulse: 71 71 69 78  Resp: 18 20 (!) 22 15  Temp:  (!) 97.2 F (36.2 C)    TempSrc:   Axillary    SpO2:  100% 100% 100%  Weight:      Height:        Intake/Output Summary (Last 24 hours) at 05/02/2019 1025 Last data filed at 05/02/2019 1000 Gross per 24 hour  Intake 2853.86 ml  Output 3285 ml  Net -431.14 ml   Last 3 Weights 05/02/2019 05/01/2019 04/30/2019  Weight (lbs) 263 lb 0.1 oz 265 lb 10.5 oz 264 lb 5.3 oz  Weight (kg) 119.3 kg 120.5 kg 119.9 kg      Telemetry    Sinus rhythm.  PVCs.- Personally Reviewed  ECG    n/a - Personally Reviewed  Physical Exam   VS:  BP (!) 126/96   Pulse 78   Temp (!) 97.2 F (36.2 C) (Axillary)   Resp 15   Ht 6' 1"  (1.854 m)   Wt 119.3 kg   SpO2 100%   BMI 34.70 kg/m  , BMI Body mass index is 34.7 kg/m. GENERAL: Critically ill-appearing.  Comfortably sedated HEENT: Oral mucosa unremarkable.  ET tube in place NECK:  Unable to assess JVD.  IJ catheter in place.  Carotid upstroke brisk and symmetric, no bruits LUNGS:  Vented breath sounds HEART:  RRR.  PMI not displaced or sustained,S1 and S2 within normal limits, no S3, no S4, no clicks, no rubs, no murmurs ABD:  Flat, positive bowel sounds normal in frequency in pitch, no bruits, no rebound, no guarding, no midline pulsatile mass,  no hepatomegaly, no splenomegaly EXT:  2 plus pulses throughout, no edema, no cyanosis no clubbing SKIN:  No rashes no nodules NEURO:  Unable to assess.  Intubated and sedated PSYCH:  Unable to assess.  Intubated and sedated   Labs    High Sensitivity Troponin:   Recent Labs  Lab 04/23/19 0945 04/23/19 1122 04/26/19 0158  TROPONINIHS 16,274* 14,226* 2,409*      Chemistry Recent Labs  Lab 04/28/19 0549 04/28/19 1602 04/29/19 0512 04/29/19 1600 04/30/19 0437 04/30/19 1612 05/01/19 0437 05/01/19 1610 05/02/19 0330  NA 137   < > 136   < > 135   < > 135 133* 134*  K 4.0   < > 4.0   < > 4.0   < > 4.1 4.4 4.1  CL 97*   < > 99   < > 100   < > 101 100 101  CO2 24   < > 23   < > 21*   < > 22 21* 20*  GLUCOSE 138*   < > 222*   <  > 174*   < > 182* 156* 207*  BUN 54*   < > 63*   < > 61*   < > 58* 58* 62*  CREATININE 3.11*   < > 3.23*   < > 3.02*   < > 3.05* 3.06* 3.11*  CALCIUM 8.7*   < > 8.8*   < > 8.9   < > 9.0 8.6* 8.7*  PROT 6.3*  --  6.9  --  7.0  --   --   --   --   ALBUMIN 2.8*  2.8*   < > 2.9*  2.8*   < > 2.8*  2.8*   < > 2.7* 2.6* 2.6*  AST 402*  --  216*  --  129*  --   --   --   --   ALT 1,909*  --  1,338*  --  897*  --   --   --   --   ALKPHOS 166*  --  160*  --  152*  --   --   --   --   BILITOT 2.4*  --  1.8*  --  1.8*  --   --   --   --   GFRNONAA 25*   < > 24*   < > 26*   < > 25* 25* 25*  GFRAA 29*   < > 27*   < > 30*   < > 29* 29* 29*  ANIONGAP 16*   < > 14   < > 14   < > 12 12 13    < > = values in this interval not displayed.     Hematology Recent Labs  Lab 04/30/19 0437 05/01/19 0437 05/02/19 0330  WBC 19.9* 14.2* 11.8*  RBC 4.28 4.15* 3.98*  HGB 13.1 13.0 12.4*  HCT 41.2 40.5 37.9*  MCV 96.3 97.6 95.2  MCH 30.6 31.3 31.2  MCHC 31.8 32.1 32.7  RDW 14.6 14.8 14.6  PLT 118* 92* 49*    BNPNo results for input(s): BNP, PROBNP in the last 168 hours.   DDimer No results for input(s): DDIMER in the last 168 hours.   Radiology    No results found.  Cardiac Studies   Echo 04/25/19: IMPRESSIONS   1. Left ventricular ejection fraction, by estimation, is <20%. The left  ventricle has severely decreased function. The left ventricle demonstrates  global hypokinesis. The left ventricular  internal cavity size was severely  dilated. Possible small 0.8 x  0.6 cm apical thrombus. Somewhat subtle finding, could confirm with  cardiac MRI. However, if patient has had CVA, would be reasonable to  anticoagulate given severity of LV dysfunction if no contraindications to  anticoagulation.  2. Limited echo.   Patient Profile     34 y.o. male chronic systolic diastolic heart failure, LVEF 15%, polysubstance abuse, Tobacco abuse, admitted with stroke requiring TPA.  Hospitalization  complicated by acute kidney injury requiring CVVHD and polymorphic VT.  Patient remains intubated and without purposeful movement.  Assessment & Plan    # Chronic systolic and diastolic heart failure: Remains on levophed.  No milrinone 2/2 AKI on CRRT.  Not on dopamine/dobutamine 2/2 SVT and VT.  Home digoxin on hold 2/2 renal dysfunction.  Volume managed with CRRT.  CVP 21.  Continue with fluid removal as BP tolerates.   # LV thrombus: Currently anticoagulated.  Heparin switched to bivalirudin 2/2 concern for HITT.  # Acute stroke: Remains intubated 2/2 lack of neurologic recovery.  Presumed ebolic with bilateral infarcts.  Received tpa this admission.  LV thrombus noted on echo.  # Polymorphic VT: # Prolonged QT: # SVT: Switch amiodarone to oral.  Add beta blocker when able.  Avoid QT prolonging agents.   # Acute hypoxic respiratory failure:  Remains intubated as above.  # AKI:  No UOP.  On CRRT.      For questions or updates, please contact [place] Please consult [email or web address] for contact info under        Signed, Skeet Latch, MD  05/02/2019, 10:25 AM

## 2019-05-02 NOTE — Progress Notes (Addendum)
[facility] for Heparin >> bivalirudin Indication: stroke 2/5, LV thrombus 2/8, HIT r/o  No Known Allergies  Patient Measurements: Height: 6\' 1"  (185.4 cm) Weight: 263 lb 0.1 oz (119.3 kg) IBW/kg (Calculated) : 79.9 Heparin Dosing Weight: 107 kg  Vital Signs: Temp: 97.9 F (36.6 C) (02/15 0400) Temp Source: Axillary (02/15 0400) BP: 143/111 (02/15 0700) Pulse Rate: 79 (02/15 0715)  Labs: Recent Labs    04/30/19 0437 04/30/19 0437 04/30/19 1612 05/01/19 0437 05/01/19 1610 05/02/19 0330  HGB 13.1   < >  --  13.0  --  12.4*  HCT 41.2  --   --  40.5  --  37.9*  PLT 118*  --   --  92*  --  49*  HEPARINUNFRC 0.26*   < > 0.31 0.34  --  0.34  CREATININE 3.02*   < > 3.20* 3.05* 3.06* 3.11*   < > = values in this interval not displayed.    Estimated Creatinine Clearance: 45.3 mL/min (A) (by C-G formula based on SCr of 3.11 mg/dL (H)).  Assessment: 34 yo m initially presented with aphasia and right sided weaknesss. Found to have left MCA infarct - received TPA and revascularization with IR on 2/5. On 2/8 found to have small LV apical thrombus on ECHO.  Pharmacy consulted to dose IV heparin.  AM heparin level therapeutic at 0.34. Hgb stable, plt down to 49, suspect due to shocked liver and less likely HIT. Had thrombus before receiving any heparin so 4T = 5. Noted heparin given in CRRT circuit as well.   ADDENDUM: after discussion with Dr. Elsworth Soho, will start argatroban until HIT antibody results. Given patient with liver and kidney dysfunction, there will be decrease clearance of argatroban (liver metabolized) and bivalirudin (metabolized by blood enzymes and cleared by kidney). Per RN, CRRT rate has been very stable on a small dose of norepinephrine so will start bivalirudin given unclear liver function. Patient had a baseline aPTT before starting heparin of 24. Recommended target of 1.5 to 2.5x baseline =  36 - 60 sec. [place] guideline target is  50-85 sec so will target lower end of this goal. Will use adjusted weight so dose will not fluctuate with fluid shifts.   Goal of Therapy:  APTT 50- 65 sec  Monitor platelets by anticoagulation protocol: Yes   Plan:  Stop heparin  Start bivalirudin at 0.05 mg/kg/hr Monitor aPTT Q4 hr until at goal for 2 consecutive levels, then Q12 hr Monitor daily CBC, plt and BMP at least Q72 hr  Monitor for signs/symptoms of bleeding  F/u HIT ab result   Benetta Spar, PharmD, BCPS, BCCP Clinical Pharmacist  Please check AMION for all [place] phone numbers After 10:00 PM, call [place]

## 2019-05-03 ENCOUNTER — Inpatient Hospital Stay (HOSPITAL_COMMUNITY): Payer: No Typology Code available for payment source

## 2019-05-03 DIAGNOSIS — I63312 Cerebral infarction due to thrombosis of left middle cerebral artery: Secondary | ICD-10-CM | POA: Diagnosis not present

## 2019-05-03 DIAGNOSIS — G9340 Encephalopathy, unspecified: Secondary | ICD-10-CM | POA: Diagnosis not present

## 2019-05-03 DIAGNOSIS — J9600 Acute respiratory failure, unspecified whether with hypoxia or hypercapnia: Secondary | ICD-10-CM | POA: Diagnosis not present

## 2019-05-03 DIAGNOSIS — N179 Acute kidney failure, unspecified: Secondary | ICD-10-CM | POA: Diagnosis not present

## 2019-05-03 DIAGNOSIS — I6602 Occlusion and stenosis of left middle cerebral artery: Secondary | ICD-10-CM | POA: Diagnosis not present

## 2019-05-03 LAB — RENAL FUNCTION PANEL
Albumin: 2.4 g/dL — ABNORMAL LOW (ref 3.5–5.0)
Albumin: 2.5 g/dL — ABNORMAL LOW (ref 3.5–5.0)
Anion gap: 11 (ref 5–15)
Anion gap: 10 (ref 5–15)
BUN: 62 mg/dL — ABNORMAL HIGH (ref 6–20)
BUN: 56 mg/dL — ABNORMAL HIGH (ref 6–20)
CO2: 22 mmol/L (ref 22–32)
CO2: 22 mmol/L (ref 22–32)
Calcium: 8.8 mg/dL — ABNORMAL LOW (ref 8.9–10.3)
Calcium: 8.6 mg/dL — ABNORMAL LOW (ref 8.9–10.3)
Chloride: 102 mmol/L (ref 98–111)
Chloride: 102 mmol/L (ref 98–111)
Creatinine, Ser: 3.02 mg/dL — ABNORMAL HIGH (ref 0.61–1.24)
Creatinine, Ser: 2.85 mg/dL — ABNORMAL HIGH (ref 0.61–1.24)
GFR calc Af Amer: 30 mL/min — ABNORMAL LOW (ref >60)
GFR calc Af Amer: 32 mL/min — ABNORMAL LOW (ref >60)
GFR calc non Af Amer: 26 mL/min — ABNORMAL LOW (ref >60)
GFR calc non Af Amer: 28 mL/min — ABNORMAL LOW (ref >60)
Glucose, Bld: 175 mg/dL — ABNORMAL HIGH (ref 70–99)
Glucose, Bld: 182 mg/dL — ABNORMAL HIGH (ref 70–99)
Phosphorus: 4.2 mg/dL (ref 2.5–4.6)
Phosphorus: 4.6 mg/dL (ref 2.5–4.6)
Potassium: 4 mmol/L (ref 3.5–5.1)
Potassium: 4.2 mmol/L (ref 3.5–5.1)
Sodium: 135 mmol/L (ref 135–145)
Sodium: 134 mmol/L — ABNORMAL LOW (ref 135–145)

## 2019-05-03 LAB — GLUCOSE, CAPILLARY
Glucose-Capillary: 162 mg/dL — ABNORMAL HIGH (ref 70–99)
Glucose-Capillary: 154 mg/dL — ABNORMAL HIGH (ref 70–99)
Glucose-Capillary: 155 mg/dL — ABNORMAL HIGH (ref 70–99)
Glucose-Capillary: 140 mg/dL — ABNORMAL HIGH (ref 70–99)
Glucose-Capillary: 150 mg/dL — ABNORMAL HIGH (ref 70–99)
Glucose-Capillary: 156 mg/dL — ABNORMAL HIGH (ref 70–99)

## 2019-05-03 LAB — CBC
HCT: 35.2 % — ABNORMAL LOW (ref 39.0–52.0)
Hemoglobin: 11.3 g/dL — ABNORMAL LOW (ref 13.0–17.0)
MCH: 30.9 pg (ref 26.0–34.0)
MCHC: 32.1 g/dL (ref 30.0–36.0)
MCV: 96.2 fL (ref 80.0–100.0)
Platelets: 53 10*3/uL — ABNORMAL LOW (ref 150–400)
RBC: 3.66 MIL/uL — ABNORMAL LOW (ref 4.22–5.81)
RDW: 14.5 % (ref 11.5–15.5)
WBC: 9.4 10*3/uL (ref 4.0–10.5)
nRBC: 0.3 % — ABNORMAL HIGH (ref 0.0–0.2)

## 2019-05-03 LAB — COOXEMETRY PANEL
Carboxyhemoglobin: 1.4 % (ref 0.5–1.5)
Methemoglobin: 0.4 % (ref 0.0–1.5)
O2 Saturation: 99.3 %
Total hemoglobin: 15.5 g/dL (ref 12.0–16.0)

## 2019-05-03 LAB — HEPARIN INDUCED PLATELET AB (HIT ANTIBODY): Heparin Induced Plt Ab: 1.692 OD — ABNORMAL HIGH (ref 0.000–0.400)

## 2019-05-03 LAB — APTT
aPTT: 58 seconds — ABNORMAL HIGH (ref 24–36)
aPTT: 58 seconds — ABNORMAL HIGH (ref 24–36)

## 2019-05-03 LAB — MAGNESIUM: Magnesium: 2.7 mg/dL — ABNORMAL HIGH (ref 1.7–2.4)

## 2019-05-03 NOTE — Progress Notes (Signed)
STROKE TEAM PROGRESS NOTE   INTERVAL HISTORY Patient remains sedated and intubated and on ventilatory support for respiratory failure.  He was able to come off pressors last night.  He remains CVVHD and cardiogenic shock appears to be improving he is now off Levophed.  Schock liver also improving.He responds by opening his eyes and can do some tracking but is not consistently following commands   OBJECTIVE Vitals:   05/03/19 0700 05/03/19 0723 05/03/19 0800 05/03/19 0900  BP: (!) 141/91 (!) 141/91 116/78 (!) 136/101  Pulse: 99 79 84 91  Resp: (!) 24 (!) 24 (!) 23 16  Temp:   98.4 F (36.9 C)   TempSrc:   Axillary   SpO2: 100% 100% 100% 100%  Weight:      Height:       CBC:  Recent Labs  Lab 05/02/19 0330 05/03/19 0358  WBC 11.8* 9.4  HGB 12.4* 11.3*  HCT 37.9* 35.2*  MCV 95.2 96.2  PLT 49* 53*   Basic Metabolic Panel:  Recent Labs  Lab 05/02/19 0330 05/02/19 0330 05/02/19 1655 05/03/19 0358  NA 134*   < > 134* 135  K 4.1   < > 3.9 4.0  CL 101   < > 100 102  CO2 20*   < > 21* 22  GLUCOSE 207*   < > 196* 175*  BUN 62*   < > 61* 62*  CREATININE 3.11*   < > 2.79* 3.02*  CALCIUM 8.7*   < > 8.5* 8.8*  MG 3.1*  --   --  2.7*  PHOS 4.8*   < > 5.2* 4.2   < > = values in this interval not displayed.   Lipid Panel:     Component Value Date/Time   CHOL 265 (H) 04/23/2019 0327   TRIG 191 (H) 04/23/2019 0327   HDL 24 (L) 04/23/2019 0327   CHOLHDL 11.0 04/23/2019 0327   VLDL 38 04/23/2019 0327   LDLCALC 203 (H) 04/23/2019 0327   HgbA1c:  Lab Results  Component Value Date   HGBA1C 6.5 (H) 04/23/2019   Urine Drug Screen:     Component Value Date/Time   LABOPIA NONE DETECTED 04/23/2019 0930   COCAINSCRNUR NONE DETECTED 04/23/2019 0930   COCAINSCRNUR NONE DETECTED 02/04/2018 1010   LABBENZ POSITIVE (A) 04/23/2019 0930   AMPHETMU NONE DETECTED 04/23/2019 0930   THCU NONE DETECTED 04/23/2019 0930   LABBARB NONE DETECTED 04/23/2019 0930    Alcohol Level      Component Value Date/Time   ETH <10 05/11/2019 0355    IMAGING CT HEAD CODE STROKE WO CONTRAST 05/01/2019 1. Asymmetric hyperdensity of a Left MCA branch in the Sylvian fissure suspicious for emergent large vessel occlusion in the setting.  2. No associated acute cortically based infarct, ASPECTS 10. No acute hemorrhage.  3. Chronic right frontal operculum encephalomalacia.   CT Code Stroke CTA Head W/WO contrast CT Code Stroke CTA Neck W/WO contrast 04/28/2019 1. Positive for emergent large vessel occlusion at the Left MCA bifurcation. There is reconstitution of the dominant posterior division branch.  2. Elsewhere negative CTA head and neck. Dominant right vertebral artery which supplies the basilar. Fetal type PCA origins.   CT CEREBRAL PERFUSION W CONTRAST 04/29/2019 1. CTP detects evidence of Left MCA territory oligemia concordant with the CTA findings.  2. No infarct core detected with standard CBF <30%.  3. 60 mL of penumbra detected with standard T-max > 6s.   CT HEAD WO CONTRAST 04/23/2019 Small region  of loss of gray-white differentiation in the left frontal operculum consistent with a fairly small area of acute infarction compared to the previous perfusion abnormality. No hemorrhage or mass effect. Areas of low-density in the cerebellum that I favor are artifactual. There did not seem to be any abnormalities in that region on the previous examinations.   MRI / MRA Head WO Contrast  04/28/2019 Extensive acute infarction in the right cerebellum with swelling and petechial blood products. Acute infarction of the superior cerebellar peduncle on the right, with some infarction in the mid brain. Extensive acute infarction in the right temporal lobe and insula. Small acute infarctions in the frontal operculum region and right parietal region. Infarction affecting the right posterior basal ganglia and caudate nucleus. Petechial blood products present without frank hematoma. Acute infarction  in the left insula and frontal operculum. Petechial blood products present without frank hematoma. Small punctate acute infarctions higher in the left frontoparietal junction region. No large or medium vessel occlusion seen presently. Restoration of flow in the left MCA. Flow appears to be present in posterior circulation main vessels and branch vessels presently.   DG CHEST PORT 1 VIEW 05/03/2019 1. Coarsening patchy opacity in the left lung and right lung base, asymmetric edema versus infection. 2. Stable cardiomegaly. 3. Stable support apparatus. 04/30/2019 Improvement of bilateral airspace disease. 04/29/2019 Patchy opacities consistent with the given clinical history. Tubes and lines as described.  04/27/2019 Perhaps slightly improved aeration at the left lung base. Otherwise no change in the bilateral patchy airspace and interstitial changes, likely associated with small effusions. Lines and tubes unchanged. 04/26/2019 0854 1. Lines and tubes as described above. 2. Cardiomegaly with basilar airspace disease, likely associated with effusions. 3. Interstitial and alveolar opacities elsewhere may represent a combination of infection and edema and are little changed. 04/25/2019 0946 Overall improved aeration from the prior exam although persistent opacities and small effusions are noted. 04/23/19 1636 1. Interval placement of a right neck vascular sheath, tip positioned over the right brachiocephalic vein. 2.  Other support apparatus in unchanged position. 3. Slight interval increase in diffuse bilateral interstitial and heterogeneous airspace opacity, consistent with multifocal infection, edema, and/or ARDS. 4. Cardiomegaly. 04/23/2019 0757 Increasing patchy bilateral airspace opacities which may represent edema or infection. 04/27/2019 1958 1. Interval placement of neck vascular catheter, tip positioned near the superior cavoatrial junction.  2. Interval retraction of endotracheal tube, now positioned just  above the thoracic inlet, approximately 6.5 cm above the carina.  3. Interval placement of esophagogastric tube, tip and side port below the diaphragm. 4. Stable cardiomegaly and diffuse bilateral interstitial airspace opacity, likely edema.  05/02/2019 0841 1. Endotracheal tube tip noted just above the carina. Proximal repositioning of 2-3 should be considered. 2. Cardiomegaly again noted. Diffuse bilateral pulmonary infiltrates/edema. Small left pleural effusion.   ECHOCARDIOGRAM COMPLETE 05/12/2019 1. Left ventricular ejection fraction, by visual estimation, is <20%. The left ventricle has severely decreased function. There is no left ventricular hypertrophy.   2. Apex is not well-visualized. In setting of stroke with severe LV systolic dysfunction, recommend repeat TTE with contrast to rule out LV apical thrombus   3. Severely dilated left ventricular internal cavity size.   4. Left ventricular diastolic parameters are consistent with Grade III diastolic dysfunction (restrictive).   5. Elevated left atrial pressure.   6. The left ventricle demonstrates global hypokinesis.   7. Global right ventricle has normal systolic function.The right ventricular size is mildly enlarged.   8. Left atrial size was  moderately dilated.   9. Right atrial size was normal.  10. The mitral valve is normal in structure. Mild mitral valve regurgitation.  11. The tricuspid valve is normal in structure. Tricuspid valve regurgitation is trivial.  12. The aortic valve is normal in structure. Aortic valve regurgitation is not visualized. No evidence of aortic valve sclerosis or stenosis.  13. The pulmonic valve was not well visualized. Pulmonic valve regurgitation is not visualized.   Interventional Neuro Radiology - Cerebral Angiogram with Intervention 05/13/2019 S/P Lt common carotid arteriogram followed by complete revascularization of occluded LT MCA sup division mid M2 seg with x 1 pass with 54mx 40 mm solitaire X ret  river device and penumbra aspiration with TICI 3 revascularization  ECG - SR rate 93 BPM. (See cardiology reading for complete details)  PHYSICAL EXAM   Temp:  [97.7 F (36.5 C)-98.4 F (36.9 C)] 98.4 F (36.9 C) (02/16 0800) Pulse Rate:  [36-99] 91 (02/16 0900) Resp:  [15-24] 16 (02/16 0900) BP: (103-141)/(69-101) 136/101 (02/16 0900) SpO2:  [100 %] 100 % (02/16 0900) Arterial Line BP: (103-156)/(58-101) 140/90 (02/16 0900) FiO2 (%):  [40 %] 40 % (02/16 0728) Weight:  [[211kg] 119 kg (02/16 0500)  General - obese young [place], intubated off precedex.  Ophthalmologic - fundi not visualized due to noncooperation.  Cardiovascular - Regular rhythm and rate.  Neuro - intubated    Eyes are closed but able to open with repetitive stimulation. He does not follow, intermittent tracking to side of voice and blinking to visual threat but not consistent. PERRL. Corneal reflex present, gag and cough present. Breathing over the vent. Facial symmetry not able to test due to ET tube.  Tongue protrusion not cooperative. On pain stimulation, no movement of all limbs, however spontaneous movement of left arm was seen at one time. DTR 1+ and bilateral positive babinski.  He does not grimace to painful stimuli.   ASSESSMENT/PLAN Carlos Brewer is a 34y.o. male with history of NICM EF 15-20%, CHF, DM, Obesity, tobacco use, OSA, and hx of cocaine use presenting with aphasia and right sided weakness. The patient received IV t-PA on Friday 05/02/2019 at 4:45 AM. Mechanical thrombectomy left M2.  Stroke:  Extensive R cerebellar, midbrain, R MCA and L MCA infarcts s/p tPA & IR with left M2 TICI3 reperfusion - embolic - likely secondary to severe cardiomyopathy with low EF and LV thrombus  Code Stroke CT Head - left MCA hyperdensity.   CTA H&N - emergent large vessel occlusion at the Left MCA bifurcation.   CT Perfusion - 60 mL of penumbra detected.   CT head repeat - Small  region of loss of gray-white differentiation in the left frontal operculum   MRI head - 04/28/2019 - extensive R cerebellar infarct w/ petechial hemorrhage. R cerebellar peduncle and midbrain infarct. Extensive R temporal lobe and insula infarct w/ R frontal operculum and parietal infarcts. R posterior basal ganglia and caudate head infarct w/ petechial hemorrhage. L insula and L frontal operculum infarcts w/ petechial hemorrhage. Small L frontoparietal jxn infarcts.   MRA head restoration L MCA flow. Flow in posterior circulation.   2D Echo - EF < 20%   2D with contrast - possible LV apical thrombus 0.8 x 0.6  [place] Corona Virus 2 - negative  LDL - 203  HgbA1c - 6.5  VTE prophylaxis - angiomax IV  aspirin 81 mg daily prior to admission, IV heparin on hold d/t thrombocytopenia, now on  angiomax until HIT resulted  Therapy recommendations:  pending  Disposition:  Pending  Encephalopathy  Not following commands or moving extremities  MRI extensive R and L brain and R cerebellar infarcts  Prognosis is poor, concerning for vegetative state vs minimally conscious state  Fiance present on camera during rounds. Ongoing GOC discussion  ? HIT  On heparin IV for LV thrombus  Platelet [id number]     Heparin discontinued  HIT panel sent - pending  On Angiomax IV  CCM on board  Platelet monitoring  Acute Hypoxemic Respiratory failure  Intubated  Off sedation now   CCM on board  CXR - 05/03/19 - coarsening patchy opacity L and R lung bases edema vs infection  Prn precedex as BP will allow  Will need trach once off pressor  Cardiogenic vs. Septic shock  Fever, TMax afebrile  Leukocytosis, WBCs 9.4  CXR - 04/23/19 - consistent with multifocal infection, edema, and/or ARDS (unchanged) CXR - 04/30/19 - Improvement of bilateral airspace disease.  On Meropenum 04/23/19>>04/29/19  On Vancomycin 04/23/19>> 04/26/19  On hydrocortisone->off  U/A neg, Urine culture no  growth   Blood cultures neg   On pressors - Neo  LV thrombus Severe nonischemic cardiomyopathy Chronic systolic CHF  2D Echo - EF < 20%   2D with contrast - possible LV apical thrombus 0.8 x 0.6  On heparin IV -> angiomax IV  On amiodarone gtt for wide-complex tachycardia and hypotension -> changed to po  Add beta blocker once BP allows  Cardiology on board  Polymorphic V-tach / SVT [place]  Elevated troponin Prolonged QT  On amiodarone drip -> now changed to po  Troponin trending down 16274->14226->2409   Cardiology on board  May consider cardioversion if needed  AKI  Creatinine - 3.02  BUN 62  Renal consult 04/23/19  Still minimal urinary output, no signs of renal recovery  On CRRT  Hypotension w/ shock Hx Hypertension  Home BP meds: Coreg, Entresto  On pressors - Neo  SBP goal  >110  Stable  Shock liver d/t hypoperfusion, improving  GI on board  AST/ALT - >10000/10132 ->2846/4801->1059/3520->402/1909->216/1338->129/ 897 (04/30/19)  INR 3.1->1.8->1.2  CK 602  Hyperlipidemia  Home Lipid lowering medication: Lipitor 40 mg daily  LDL 203, goal < 70  Current lipid lowering medication: Lipitor 80 mg daily   Continue statin at discharge  Diabetes type II, uncontrolled  Home diabetic meds: insulin  Current diabetic meds: SS insulin, Levemir 20, novolog 4u q6h  HgbA1c 6.5, goal < 7.0  CBG monitoring   SSI  Dysphagia . Secondary to stroke . NPO . On tube feeding @ 50 . Speech on board  Tobacco abuse  Current smoker  Mom confirmed tobacco use  Smoking cessation counseling will be provided  Other Stroke Risk Factors  ETOH use, advised to drink no more than 1 alcoholic beverage per day.  Hx cocaine abuse, UDS neg   Obesity, Body mass index is 34.61 kg/m., recommend weight loss, diet and exercise as appropriate   Obstructive sleep apnea - uses Cpap  Substance Abuse Hx of cocaine use  Other Active Problems  Code  status - Full code  Hyperkalemia - 4.0 resolved  Metabolic acidosis, resolved  Depression on prozac  Hypermagnesemia - 2.7  Hyperphosphatemia - 4.8/5.0->5.0  Hospital day # 11 Patient remains critically ill with respiratory failure requiring ventilatory support as well as CRRT for renal failure.  Platelet counts also appear to be low likely due to heparin-induced thrombocytopenia Long discussion with  the patient's mother at the bedside about his prognosis and neurological condition and answered questions.  Discussed with Dr. Elsworth Soho. This patient is critically ill and at significant risk of neurological worsening, death and care requires constant monitoring of vital signs, hemodynamics,respiratory and cardiac monitoring, extensive review of multiple databases, frequent neurological assessment, discussion with family, other specialists and medical decision making of high complexity.I have made any additions or clarifications directly to the above note.This critical care time does not reflect procedure time, or teaching time or supervisory time of PA/NP/Med Resident etc but could involve care discussion time.  I spent 30 minutes of neurocritical care time  in the care of  this patient.   Antony Contras, MD To contact Stroke Continuity provider, please refer to [email or web address] After hours, contact General Neurology

## 2019-05-03 NOTE — Progress Notes (Addendum)
[facility] for Heparin >> Bivalirudin Indication: stroke 2/5, LV thrombus 2/8, HIT r/o  Allergies  Allergen Reactions  . Heparin     Heparin induced thrombocytopenia    Patient Measurements: Height: 6\' 1"  (185.4 cm) Weight: 262 lb 5.6 oz (119 kg) IBW/kg (Calculated) : 79.9   Vital Signs: Temp: 97.1 F (36.2 C) (02/16 1600) Temp Source: Axillary (02/16 1600) BP: 123/92 (02/16 1700) Pulse Rate: 74 (02/16 1700)  Labs: Recent Labs    05/01/19 0437 05/01/19 1610 05/02/19 0330 05/02/19 1300 05/02/19 1655 05/02/19 1700 05/03/19 0358 05/03/19 1645  HGB 13.0  --  12.4*  --   --   --  11.3*  --   HCT 40.5  --  37.9*  --   --   --  35.2*  --   PLT 92*  --  49*  --   --   --  53*  --   APTT  --   --   --    < >  --  58* 58* 58*  HEPARINUNFRC 0.34  --  0.34  --   --   --   --   --   CREATININE 3.05*   < > 3.11*  --  2.79*  --  3.02* 2.85*   < > = values in this interval not displayed.    Estimated Creatinine Clearance: 49.3 mL/min (A) (by C-G formula based on SCr of 2.85 mg/dL (H)).  Assessment: 34 yr old male initially presented with aphasia and right sided weaknesss. Found to have left MCA infarct - received TPA and revascularization with IR on 2/5. On 2/8 found to have small LV apical thrombus on ECHO.  Pharmacy consulted to dose IV heparin, which was switched to bivalirudin 2/15 for HIT rule out.   APTT is very stable at goal = 58 sec. Per RN, no issues with infusion. Had some bleeding in ET tube that started yesterday morning while still on heparin, not a large amount, and not worsening, per RN. H/H trending down, platelet low stable (53). HIT ab pending. Continue Q 12 hour checks and consider daily soon if continues to be stable.   Heparin for CRRT still ordered, none given since 2/14. Asked nephrology if this can be changed to sodium citrate or another anticoagulant.   HIT antibody sent yesterday resulted this afternoon: 1.692  OD  Goal of Therapy:  APTT 50- 65 sec  Monitor platelets by anticoagulation protocol: Yes   Plan:  Continue bivalirudin at 0.05 mg/kg/hr (adjusted weight) Monitor aPTT Q 12 hr Monitor daily CBC, plt and BMP at least Q 72 hr  Monitor for signs/symptoms of bleeding  F/u CRRT heparin change   Gillermina Hu, PharmD, BCPS, [facility] Clinical Pharmacist 05/03/19, 18:05 PM

## 2019-05-03 NOTE — Progress Notes (Addendum)
[facility] [place] for Heparin >> bivalirudin Indication: stroke 2/5, LV thrombus 2/8, HIT r/o  Allergies  Allergen Reactions  . Heparin     Heparin induced thrombocytopenia    Patient Measurements: Height: 6\' 1"  (185.4 cm) Weight: 262 lb 5.6 oz (119 kg) IBW/kg (Calculated) : 79.9 Heparin Dosing Weight: 107 kg  Vital Signs: Temp: 98 F (36.7 C) (02/16 0400) Temp Source: Axillary (02/16 0400) BP: 141/91 (02/16 0700) Pulse Rate: 99 (02/16 0700)  Labs: Recent Labs    04/30/19 1612 04/30/19 1612 05/01/19 0437 05/01/19 1610 05/02/19 0330 05/02/19 1300 05/02/19 1655 05/02/19 1700 05/03/19 0358  HGB  --    < > 13.0  --  12.4*  --   --   --  11.3*  HCT  --   --  40.5  --  37.9*  --   --   --  35.2*  PLT  --   --  92*  --  49*  --   --   --  53*  APTT  --   --   --   --   --  55*  --  58* 58*  HEPARINUNFRC 0.31  --  0.34  --  0.34  --   --   --   --   CREATININE 3.20*   < > 3.05*   < > 3.11*  --  2.79*  --  3.02*   < > = values in this interval not displayed.    Estimated Creatinine Clearance: 46.6 mL/min (A) (by C-G formula based on SCr of 3.02 mg/dL (H)).  Assessment: 34 yo m initially presented with aphasia and right sided weaknesss. Found to have left MCA infarct - received TPA and revascularization with IR on 2/5. On 2/8 found to have small LV apical thrombus on ECHO.  Pharmacy consulted to dose IV heparin, which was switched to bivalirudin 2/15 for HIT rule out.   APTT is very stable at goal = 58 sec. Per RN, no issues with infusion. Had some bleeding in ET tube that started yesterday morning while still on heparin, not a large amount. H/H trending down, platelet low stable. HIT ab pending. Continue Q12 hour checks and consider daily soon if continues to be stable.   Heparin for CRRT still ordered, none given since 2/14. Asked nephrology if this can be changed to sodium citrate or another anticoagulant.   Goal of Therapy:  APTT 50- 65  sec  Monitor platelets by anticoagulation protocol: Yes   Plan:  Continue bivalirudin at 0.05 mg/kg/hr (adjusted weight) Monitor aPTT Q12hr Monitor daily CBC, plt and BMP at least Q72 hr  Monitor for signs/symptoms of bleeding  F/u HIT ab result (sent 2/15) F/u CRRT heparin change   Benetta Spar, PharmD, BCPS, [facility] Clinical Pharmacist  Please check AMION for all [place] phone numbers After 10:00 PM, call [place]

## 2019-05-03 NOTE — Progress Notes (Signed)
Patient ID: Carlos Brewer., male   DOB: 1986-01-02, 34 y.o.   MRN: [id number] S: Tolerating CVVHD and was able to get off of pressors last night.  Changed filter for first time in 2 days. O:BP (!) 130/98   Pulse 77   Temp (!) 97.5 F (36.4 C) (Axillary)   Resp (!) 22   Ht 6' 1"  (1.854 m)   Wt 119 kg   SpO2 100%   BMI 34.61 kg/m   Intake/Output Summary (Last 24 hours) at 05/03/2019 1241 Last data filed at 05/03/2019 1200 Gross per 24 hour  Intake 1652.39 ml  Output 1374 ml  Net 278.39 ml   Intake/Output: I/O last 3 completed shifts: In: 3090.5 [I.V.:1230.5; NG/GT:1860] Out: 3283 [Other:2633; Stool:650]  Intake/Output this shift:  Total I/O In: 319.7 [I.V.:69.7; NG/GT:250] Out: 255 [Other:255] Weight change: -0.3 kg Gen: intubated and sedated CVS: no rub Resp: occ rhonchi Abd: +BS, soft, NT Ext: no edema  Recent Labs  Lab 04/27/19 0427 04/27/19 1608 04/28/19 0549 04/28/19 1602 04/29/19 0512 04/29/19 1600 04/30/19 0437 04/30/19 1612 05/01/19 0437 05/01/19 1610 05/02/19 0330 05/02/19 1655 05/03/19 0358  NA 136   < > 137   < > 136   < > 135 136 135 133* 134* 134* 135  K 4.8   < > 4.0   < > 4.0   < > 4.0 4.4 4.1 4.4 4.1 3.9 4.0  CL 101   < > 97*   < > 99   < > 100 102 101 100 101 100 102  CO2 21*   < > 24   < > 23   < > 21* 22 22 21* 20* 21* 22  GLUCOSE 217*   < > 138*   < > 222*   < > 174* 178* 182* 156* 207* 196* 175*  BUN 44*   < > 54*   < > 63*   < > 61* 62* 58* 58* 62* 61* 62*  CREATININE 3.10*   < > 3.11*   < > 3.23*   < > 3.02* 3.20* 3.05* 3.06* 3.11* 2.79* 3.02*  ALBUMIN 2.9*  3.0*   < > 2.8*  2.8*   < > 2.9*  2.8*   < > 2.8*  2.8* 2.7* 2.7* 2.6* 2.6* 2.5* 2.4*  CALCIUM 8.5*   < > 8.7*   < > 8.8*   < > 8.9 8.7* 9.0 8.6* 8.7* 8.5* 8.8*  PHOS 4.1   < > 3.3   < > 4.3   < > 4.8*  5.0* 5.2* 5.0* 5.2* 4.8* 5.2* 4.2  AST 1,059*  --  402*  --  216*  --  129*  --   --   --   --   --   --   ALT 3,520*  --  1,909*  --  1,338*  --  897*  --   --   --    --   --   --    < > = values in this interval not displayed.   Liver Function Tests: Recent Labs  Lab 04/28/19 0549 04/28/19 1602 04/29/19 0512 04/29/19 1600 04/30/19 0437 04/30/19 1612 05/02/19 0330 05/02/19 1655 05/03/19 0358  AST 402*  --  216*  --  129*  --   --   --   --   ALT 1,909*  --  1,338*  --  897*  --   --   --   --   ALKPHOS 166*  --  160*  --  152*  --   --   --   --   BILITOT 2.4*  --  1.8*  --  1.8*  --   --   --   --   PROT 6.3*  --  6.9  --  7.0  --   --   --   --   ALBUMIN 2.8*  2.8*   < > 2.9*  2.8*   < > 2.8*  2.8*   < > 2.6* 2.5* 2.4*   < > = values in this interval not displayed.   No results for input(s): LIPASE, AMYLASE in the last 168 hours. No results for input(s): AMMONIA in the last 168 hours. CBC: Recent Labs  Lab 04/29/19 0512 04/29/19 0512 04/30/19 0437 04/30/19 0437 05/01/19 0437 05/02/19 0330 05/03/19 0358  WBC 24.0*   < > 19.9*   < > 14.2* 11.8* 9.4  HGB 13.4   < > 13.1   < > 13.0 12.4* 11.3*  HCT 42.2   < > 41.2   < > 40.5 37.9* 35.2*  MCV 96.6  --  96.3  --  97.6 95.2 96.2  PLT 172   < > 118*   < > 92* 49* 53*   < > = values in this interval not displayed.   Cardiac Enzymes: No results for input(s): CKTOTAL, CKMB, CKMBINDEX, TROPONINI in the last 168 hours. CBG: Recent Labs  Lab 05/02/19 1937 05/02/19 2324 05/03/19 0335 05/03/19 0801 05/03/19 1205  GLUCAP 143* 178* 162* 154* 155*    Iron Studies: No results for input(s): IRON, TIBC, TRANSFERRIN, FERRITIN in the last 72 hours. Studies/Results: DG Chest Port 1 View  Result Date: 05/03/2019 CLINICAL DATA:  Acute respiratory failure. EXAM: PORTABLE CHEST 1 VIEW COMPARISON:  Radiograph 04/30/2019 FINDINGS: Endotracheal tube tip 4.1 cm from the carina. Enteric tube in place with tip below the diaphragm not included in the field of view. Left internal central venous catheter tip projects over the upper SVC. Right jugular catheter in the region of the brachiocephalic vein.  Stable cardiomegaly. Worsening patchy opacities in the left lung and right lung base. No pneumothorax. IMPRESSION: 1. Coarsening patchy opacity in the left lung and right lung base, asymmetric edema versus infection. 2. Stable cardiomegaly. 3. Stable support apparatus. Electronically Signed   By: Keith Rake M.D.   On: 05/03/2019 06:36   . sodium chloride   Intravenous Once  . amiodarone  200 mg Per Tube BID  . chlorhexidine gluconate (MEDLINE KIT)  15 mL Mouth Rinse BID  . Chlorhexidine Gluconate Cloth  6 each Topical Q0600  . feeding supplement (PRO-STAT SUGAR FREE 64)  60 mL Per Tube TID  . fentaNYL (SUBLIMAZE) injection  50 mcg Intravenous Once  . insulin aspart  0-20 Units Subcutaneous Q4H  . insulin aspart  4 Units Subcutaneous Q4H  . insulin detemir  20 Units Subcutaneous Daily  . mouth rinse  15 mL Mouth Rinse 10 times per day  . pantoprazole sodium  40 mg Per Tube Q1200  . sodium chloride flush  10-40 mL Intracatheter Q12H  . sodium chloride flush  3 mL Intravenous Q12H    BMET    Component Value Date/Time   NA 135 05/03/2019 0358   K 4.0 05/03/2019 0358   CL 102 05/03/2019 0358   CO2 22 05/03/2019 0358   GLUCOSE 175 (H) 05/03/2019 0358   BUN 62 (H) 05/03/2019 0358   CREATININE 3.02 (H) 05/03/2019 0358   CALCIUM  8.8 (L) 05/03/2019 0358   GFRNONAA 26 (L) 05/03/2019 0358   GFRAA 30 (L) 05/03/2019 0358   CBC    Component Value Date/Time   WBC 9.4 05/03/2019 0358   RBC 3.66 (L) 05/03/2019 0358   HGB 11.3 (L) 05/03/2019 0358   HCT 35.2 (L) 05/03/2019 0358   PLT 53 (L) 05/03/2019 0358   MCV 96.2 05/03/2019 0358   MCH 30.9 05/03/2019 0358   MCHC 32.1 05/03/2019 0358   RDW 14.5 05/03/2019 0358   LYMPHSABS 0.8 04/24/2019 0843   MONOABS 0.5 04/24/2019 0843   EOSABS 0.4 04/24/2019 0843   BASOSABS 0.0 04/24/2019 0843    Assessment/Plan:  1. AKI presumably due to ATN with ischemic/nephrotoxic insults worsened by cardiogenic shock.  Started on CRRT 04/23/19.  Remains  oliguric and dialysis dependent but was able to come off of pressors last night.   1. Continue with CVVHD for now as we are unable to transition to IHD at this time. 2. Increase UF rate to 50-100 ml/hr since his CVP is 19 2. Cardiogenic shock- currently off of levophed. 3. Acute stroke- presumed embolic with bilateral infarcts and LV thrombus- received tpa on admission 4. SVT/polymorphic VT- on amiodarone per Cardiology 5. Acute hypoxic respiratory failure- on vent and for trach soon if he remains off of pressors and platelets improve per PCCM 6. Thrombocytopenia- heparin discontinued. HIT panel ordered.  Donetta Potts, MD [employer] [phone]

## 2019-05-03 NOTE — Progress Notes (Signed)
NAME:  Carlos Matheson., MRN:  [id number], DOB:  October 17, 1985, LOS: 11 ADMISSION DATE:  05/05/2019, CONSULTATION DATE:  05/15/2019 REFERRING MD:  Dr. Leonel Ramsay, CHIEF COMPLAINT:  Slurred speech  Brief History   34 yo male smoker found to have slurred speech and Rt sided weakness.  CT head showed M2 occlusion.  Ultimately treated with thrombolytic and thrombectomy by IR.  Required intubation for airway protection. Course complicated by septic shock, AKI requiring CRRT and polymorphic VT  Past Medical History  Systolic CHF with non ischemic CM, Cocaine abuse, OSA, DM Hx of CHF (EF 15%)  Hx myocarditis Smoker 1/2 ppd   Significant Hospital Events   2/05 Admit, tPA, IR thrombectomy 2/6 100% on fvent at 2300  Overnight requiring increasing levophed and phenylephrine.    2/6: switched to levophed, epi, started antibiotics, started on CRRT.  2/9  developed wide-complex tachycardia and hypotension, started on amiodarone drip, increase Levophed drip 2/15 drop in platelets, heparin stopped, bivalved started  Consults:  Neuro IR  Procedures:  ETT 2/05 >>  [facility]J CVL 2/5 >> RIJ HD cath 2/6 >>  2/5-2/6:  TPA given at 428 am (total of 90 Mg) 520 am went to IR  S/P Lt common carotid arteriogram followed by complete revascularization of occluded LT MCA sup division mid M2 seg with x 1 pass with 81mx 40 mm solitaire X ret river device and penumbra aspiration with TICI 3 revascularization   Significant Diagnostic Tests:  CT angio head/neck 2/05 >> occlusion of Lt MCA bifurcation Echo 2/05 >> EF less than 20%, cannot rule out apical thrombus MRI brain 2/11 > extensive acute infarction of multiple areas without large or medium vessel occlusion Echocardiogram 2/8 Left ventricular ejection fraction, by estimation, is <20%. The left  ventricle has severely decreased function. The left ventricle demonstrates  global hypokinesis. The left ventricular internal cavity size was severely   dilated. Possible small 0.8 x  0.6 cm apical thrombus. Somewhat subtle finding, could confirm with  cardiac MRI. However, if patient has had CVA, would be reasonable to  anticoagulate given severity of LV dysfunction if no contraindications to  anticoagulation.   Micro Data:  SARS CoV2 PCR 2/05 >> negative Influenza PCR 2/05 >> negative Urine 2/6 >> ng resp 2/6 >> nml flora BC 2/6 >>ng  Antimicrobials:  mero 2/6 -2/12 Zosyn 2/6 vanc 2/6 >> 2/9  Interim history/subjective:  Remains critically ill, intubated, now off Levophed On CRRT, anuric Still has bite block    Objective   Blood pressure 131/89, pulse 80, temperature 98.4 F (36.9 C), temperature source Axillary, resp. rate (!) 23, height 6' 1"  (1.854 m), weight 119 kg, SpO2 100 %. CVP:  [11 mmHg-22 mmHg] 11 mmHg  Vent Mode: CPAP;PSV FiO2 (%):  [40 %] 40 % Set Rate:  [24 bmp] 24 bmp Vt Set:  [630 mL] 630 mL PEEP:  [5 cmH20] 5 cmH20 Pressure Support:  [12 cmH20] 12 cmH20 Plateau Pressure:  [22 cmH20-27 cmH20] 23 cmH20   Intake/Output Summary (Last 24 hours) at 05/03/2019 1033 Last data filed at 05/03/2019 1000 Gross per 24 hour  Intake 1695.25 ml  Output 1448 ml  Net 247.25 ml   Filed Weights   05/01/19 0500 05/02/19 0330 05/03/19 0500  Weight: 120.5 kg 119.3 kg 119 kg    Examination:  General -young well-built man, well developed,  in no distress Eyes -pupils reactive ENT -oral ET tube in place, bite block Cardiac -S1-S2 normal, no murmur Chest - bilateral ventilated  breath sounds, mild blood-tinged secretions Abdomen - soft, non tender, + bowel sounds Extremities - no cyanosis, clubbing, or edema Skin - no rashes Neuro -spontaneous eye opening, does not track, does not follow commands   Chest x-ray 2/16 personally reviewed shows patchy opacity in the left lung and right base, slight increase compared to 2/13  Labs show normal electrolytes, no leukocytosis, stable anemia, stable thrombocytopenia  Resolved Hospital Problem list     Assessment & Plan:   Shock Septic shock, cardiogenic shock , co-ox improved -Off Levophed -Completed 7 days of meropenem for UTI/pneumonia  Cardiogenic shock Left ventricle apical thrombus non ischemic cardiomyopathy with systolic congestive heart failure Polymorphic VT on 2/9 -Cardiology following -Continue amiodarone PO -Add beta-blocker if remains of Levophed -Lipitor, digoxin -Medications on hold include Coreg, Zaroxolyn, Entresto, Aldactone, torsemide  Acute hypoxemic respiratory failure -Start spontaneous breathing trials -We will need tracheostomy if he remains off Levophed and platelets improve -Shave off beard in anticipation  Acute kidney injury -Remains anuric, no evidence of renal recovery -Continue CRRT , negative to equal balance as BP permits -Transition to intermittent dialysis if remains off pressors -May need lines changed towards the end of the week   Left MCA CVA Multiple other infarcts noted on MRI 0/35-WSFKCL embolic Acute encephalopathy -Intermittent fentanyl, versed -Off Precedex    Thrombocytopenia -while on heparin, 4T equals 4  -Continue Angiomax, await HIT panel   DM type II poorly controlled. - SSI -Ct 20 units Levemir daily -Ct 4 units NovoLog every 4 hours TF coverage -CBG 1 40-180 acceptable  Hx of cocaine abuse. depression. - not active per UDS  - continue prozac  Summary-neurological prognosis is guarded, discussed with mom who does desire to push forward with a tracheostomy.  Explained implications of tracheostomy and will likely require SNF placement, best case scenario  Best practice:  Diet: NPO,  TFs DVT prophylaxis: SCDs GI prophylaxis: protonix Mobility: bed rest Code Status: full code Disposition: ICU Family -fianc [name] & mom updated 2/16  The patient is critically ill with multiple organ systems failure and requires high complexity decision making for assessment and  support, frequent evaluation and titration of therapies, application of advanced monitoring technologies and extensive interpretation of multiple databases. Critical Care Time devoted to patient care services described in this note independent of APP/resident  time is 33 minutes.     [name] MD. [place]. [facility]  If no response to pager , please call [phone]   05/03/2019

## 2019-05-03 NOTE — Progress Notes (Signed)
Assisted tele visit to patient with provider.  Hassie Mandt R, RN  

## 2019-05-03 NOTE — Progress Notes (Addendum)
Pharmacy Heparin Induced Thrombocytopenia (HIT) Note:  Carlos Brewer. is an 34 y.o. male being evaluated for HIT. Heparin was started 2/8 for LV thrombus, and baseline platelets were 124.  HIT labs were ordered on 2/15 when platelets dropped to 49.  Auto-populate labs:  Heparin Induced Plt Ab  Date/Time Value Ref Range Status  05/02/2019 10:59 AM 1.692 (H) 0.000 - 0.400 OD Final    Comment:    (NOTE) Performed At: [facility] [place], [place] [id number] Rush Farmer MD [id number]      CALCULATE SCORE:  4Ts (see the HIT Algorithm) Score  Thrombocytopenia 1  Timing 2  Thrombosis 2  Other causes of thrombocytopenia 1  Total 6     Recommendations (A or B) are based on available lab results (HIT antibody and/or SRA) and the HIT algorithm    A. HIT antibody result available  Possible HIT    Order SRA:  Yes  Discontinue heparin / LMWH:  Already d/c'd  Initiate alternative anticoagulation:  Already on bivalirudin  Document heparin allergy:  Already documented    B. SRA result availability  SRA not available   Name of MD Contacted: Ogan (Elink) via secure chat  Plan (Discussed with provider) Labs ordered:  SRA ordered  Heparin allergy:  Heparin allergy documented or updated. Anticoagulation plans:  Heparin drip already discontinued and alternative anticoagulation with bivalirudin begun.   Heparin for CRRT still ordered, none given since 2/14. Pharmacy sked nephrology if this can be changed to sodium citrate or another anticoagulant.   Elicia Lamp, PharmD, BCPS Please check AMION for all [facility] contact numbers Clinical Pharmacist 05/03/2019 7:36 PM

## 2019-05-04 DIAGNOSIS — N179 Acute kidney failure, unspecified: Secondary | ICD-10-CM | POA: Diagnosis not present

## 2019-05-04 DIAGNOSIS — G9340 Encephalopathy, unspecified: Secondary | ICD-10-CM | POA: Diagnosis not present

## 2019-05-04 DIAGNOSIS — I6602 Occlusion and stenosis of left middle cerebral artery: Secondary | ICD-10-CM | POA: Diagnosis not present

## 2019-05-04 DIAGNOSIS — J9601 Acute respiratory failure with hypoxia: Secondary | ICD-10-CM | POA: Diagnosis not present

## 2019-05-04 LAB — POCT I-STAT EG7
Acid-base deficit: 7 mmol/L — ABNORMAL HIGH (ref 0.0–2.0)
Acid-base deficit: 8 mmol/L — ABNORMAL HIGH (ref 0.0–2.0)
Acid-base deficit: 8 mmol/L — ABNORMAL HIGH (ref 0.0–2.0)
Acid-base deficit: 4 mmol/L — ABNORMAL HIGH (ref 0.0–2.0)
Acid-base deficit: 6 mmol/L — ABNORMAL HIGH (ref 0.0–2.0)
Bicarbonate: 21.6 mmol/L (ref 20.0–28.0)
Bicarbonate: 21.1 mmol/L (ref 20.0–28.0)
Bicarbonate: 21.6 mmol/L (ref 20.0–28.0)
Bicarbonate: 23.3 mmol/L (ref 20.0–28.0)
Bicarbonate: 22.3 mmol/L (ref 20.0–28.0)
Calcium, Ion: <0.3 mmol/L — CL (ref 1.15–1.40)
Calcium, Ion: <0.3 mmol/L — CL (ref 1.15–1.40)
Calcium, Ion: <0.3 mmol/L — CL (ref 1.15–1.40)
Calcium, Ion: 0.31 mmol/L — CL (ref 1.15–1.40)
Calcium, Ion: 0.33 mmol/L — CL (ref 1.15–1.40)
HCT: 36 % — ABNORMAL LOW (ref 39.0–52.0)
HCT: 37 % — ABNORMAL LOW (ref 39.0–52.0)
HCT: 38 % — ABNORMAL LOW (ref 39.0–52.0)
HCT: 36 % — ABNORMAL LOW (ref 39.0–52.0)
HCT: 38 % — ABNORMAL LOW (ref 39.0–52.0)
Hemoglobin: 12.2 g/dL — ABNORMAL LOW (ref 13.0–17.0)
Hemoglobin: 12.6 g/dL — ABNORMAL LOW (ref 13.0–17.0)
Hemoglobin: 12.9 g/dL — ABNORMAL LOW (ref 13.0–17.0)
Hemoglobin: 12.2 g/dL — ABNORMAL LOW (ref 13.0–17.0)
Hemoglobin: 12.9 g/dL — ABNORMAL LOW (ref 13.0–17.0)
O2 Saturation: 50 %
O2 Saturation: 71 %
O2 Saturation: 45 %
O2 Saturation: 56 %
O2 Saturation: 63 %
Patient temperature: 99
Patient temperature: 99
Potassium: 4.4 mmol/L (ref 3.5–5.1)
Potassium: 4.3 mmol/L (ref 3.5–5.1)
Potassium: 4.3 mmol/L (ref 3.5–5.1)
Potassium: 4.2 mmol/L (ref 3.5–5.1)
Potassium: 4.2 mmol/L (ref 3.5–5.1)
Sodium: 139 mmol/L (ref 135–145)
Sodium: 138 mmol/L (ref 135–145)
Sodium: 137 mmol/L (ref 135–145)
Sodium: 137 mmol/L (ref 135–145)
Sodium: 138 mmol/L (ref 135–145)
TCO2: 23 mmol/L (ref 22–32)
TCO2: 23 mmol/L (ref 22–32)
TCO2: 23 mmol/L (ref 22–32)
TCO2: 25 mmol/L (ref 22–32)
TCO2: 24 mmol/L (ref 22–32)
pCO2, Ven: 60.1 mmHg — ABNORMAL HIGH (ref 44.0–60.0)
pCO2, Ven: 61.5 mmHg — ABNORMAL HIGH (ref 44.0–60.0)
pCO2, Ven: 63 mmHg — ABNORMAL HIGH (ref 44.0–60.0)
pCO2, Ven: 52.8 mmHg (ref 44.0–60.0)
pCO2, Ven: 55.9 mmHg (ref 44.0–60.0)
pH, Ven: 7.166 — CL (ref 7.250–7.430)
pH, Ven: 7.145 — CL (ref 7.250–7.430)
pH, Ven: 7.143 — CL (ref 7.250–7.430)
pH, Ven: 7.252 (ref 7.250–7.430)
pH, Ven: 7.209 — ABNORMAL LOW (ref 7.250–7.430)
pO2, Ven: 35 mmHg (ref 32.0–45.0)
pO2, Ven: 49 mmHg — ABNORMAL HIGH (ref 32.0–45.0)
pO2, Ven: 33 mmHg (ref 32.0–45.0)
pO2, Ven: 34 mmHg (ref 32.0–45.0)
pO2, Ven: 40 mmHg (ref 32.0–45.0)

## 2019-05-04 LAB — POCT I-STAT 7, (LYTES, BLD GAS, ICA,H+H)
Acid-Base Excess: 1 mmol/L (ref 0.0–2.0)
Acid-Base Excess: 2 mmol/L (ref 0.0–2.0)
Acid-base deficit: 3 mmol/L — ABNORMAL HIGH (ref 0.0–2.0)
Acid-base deficit: 1 mmol/L (ref 0.0–2.0)
Acid-base deficit: 3 mmol/L — ABNORMAL HIGH (ref 0.0–2.0)
Bicarbonate: 22.8 mmol/L (ref 20.0–28.0)
Bicarbonate: 21.6 mmol/L (ref 20.0–28.0)
Bicarbonate: 22.4 mmol/L (ref 20.0–28.0)
Bicarbonate: 24.7 mmol/L (ref 20.0–28.0)
Bicarbonate: 22.3 mmol/L (ref 20.0–28.0)
Calcium, Ion: 0.83 mmol/L — CL (ref 1.15–1.40)
Calcium, Ion: 0.93 mmol/L — ABNORMAL LOW (ref 1.15–1.40)
Calcium, Ion: 0.84 mmol/L — CL (ref 1.15–1.40)
Calcium, Ion: 0.88 mmol/L — CL (ref 1.15–1.40)
Calcium, Ion: 0.97 mmol/L — ABNORMAL LOW (ref 1.15–1.40)
HCT: 31 % — ABNORMAL LOW (ref 39.0–52.0)
HCT: 34 % — ABNORMAL LOW (ref 39.0–52.0)
HCT: 33 % — ABNORMAL LOW (ref 39.0–52.0)
HCT: 32 % — ABNORMAL LOW (ref 39.0–52.0)
HCT: 34 % — ABNORMAL LOW (ref 39.0–52.0)
Hemoglobin: 10.5 g/dL — ABNORMAL LOW (ref 13.0–17.0)
Hemoglobin: 11.6 g/dL — ABNORMAL LOW (ref 13.0–17.0)
Hemoglobin: 11.2 g/dL — ABNORMAL LOW (ref 13.0–17.0)
Hemoglobin: 10.9 g/dL — ABNORMAL LOW (ref 13.0–17.0)
Hemoglobin: 11.6 g/dL — ABNORMAL LOW (ref 13.0–17.0)
O2 Saturation: 100 %
O2 Saturation: 100 %
O2 Saturation: 100 %
O2 Saturation: 100 %
O2 Saturation: 99 %
Patient temperature: 99
Patient temperature: 99
Potassium: 5.6 mmol/L — ABNORMAL HIGH (ref 3.5–5.1)
Potassium: 4.7 mmol/L (ref 3.5–5.1)
Potassium: 4.8 mmol/L (ref 3.5–5.1)
Potassium: 4.6 mmol/L (ref 3.5–5.1)
Potassium: 4.2 mmol/L (ref 3.5–5.1)
Sodium: 134 mmol/L — ABNORMAL LOW (ref 135–145)
Sodium: 135 mmol/L (ref 135–145)
Sodium: 134 mmol/L — ABNORMAL LOW (ref 135–145)
Sodium: 134 mmol/L — ABNORMAL LOW (ref 135–145)
Sodium: 136 mmol/L (ref 135–145)
TCO2: 24 mmol/L (ref 22–32)
TCO2: 23 mmol/L (ref 22–32)
TCO2: 23 mmol/L (ref 22–32)
TCO2: 26 mmol/L (ref 22–32)
TCO2: 23 mmol/L (ref 22–32)
pCO2 arterial: 26.7 mmHg — ABNORMAL LOW (ref 32.0–48.0)
pCO2 arterial: 34.7 mmHg (ref 32.0–48.0)
pCO2 arterial: 31.6 mmHg — ABNORMAL LOW (ref 32.0–48.0)
pCO2 arterial: 31.8 mmHg — ABNORMAL LOW (ref 32.0–48.0)
pCO2 arterial: 38.1 mmHg (ref 32.0–48.0)
pH, Arterial: 7.54 — ABNORMAL HIGH (ref 7.350–7.450)
pH, Arterial: 7.404 (ref 7.350–7.450)
pH, Arterial: 7.459 — ABNORMAL HIGH (ref 7.350–7.450)
pH, Arterial: 7.497 — ABNORMAL HIGH (ref 7.350–7.450)
pH, Arterial: 7.376 (ref 7.350–7.450)
pO2, Arterial: 197 mmHg — ABNORMAL HIGH (ref 83.0–108.0)
pO2, Arterial: 188 mmHg — ABNORMAL HIGH (ref 83.0–108.0)
pO2, Arterial: 258 mmHg — ABNORMAL HIGH (ref 83.0–108.0)
pO2, Arterial: 205 mmHg — ABNORMAL HIGH (ref 83.0–108.0)
pO2, Arterial: 149 mmHg — ABNORMAL HIGH (ref 83.0–108.0)

## 2019-05-04 LAB — GLUCOSE, CAPILLARY
Glucose-Capillary: 101 mg/dL — ABNORMAL HIGH (ref 70–99)
Glucose-Capillary: 116 mg/dL — ABNORMAL HIGH (ref 70–99)
Glucose-Capillary: 131 mg/dL — ABNORMAL HIGH (ref 70–99)
Glucose-Capillary: 220 mg/dL — ABNORMAL HIGH (ref 70–99)
Glucose-Capillary: 253 mg/dL — ABNORMAL HIGH (ref 70–99)
Glucose-Capillary: 94 mg/dL (ref 70–99)

## 2019-05-04 LAB — CBC
HCT: 35 % — ABNORMAL LOW (ref 39.0–52.0)
Hemoglobin: 11.1 g/dL — ABNORMAL LOW (ref 13.0–17.0)
MCH: 30.3 pg (ref 26.0–34.0)
MCHC: 31.7 g/dL (ref 30.0–36.0)
MCV: 95.6 fL (ref 80.0–100.0)
Platelets: 65 10*3/uL — ABNORMAL LOW (ref 150–400)
RBC: 3.66 MIL/uL — ABNORMAL LOW (ref 4.22–5.81)
RDW: 14.4 % (ref 11.5–15.5)
WBC: 9.9 10*3/uL (ref 4.0–10.5)
nRBC: 0 % (ref 0.0–0.2)

## 2019-05-04 LAB — RENAL FUNCTION PANEL
Albumin: 2.5 g/dL — ABNORMAL LOW (ref 3.5–5.0)
Albumin: 2.4 g/dL — ABNORMAL LOW (ref 3.5–5.0)
Anion gap: 12 (ref 5–15)
Anion gap: 13 (ref 5–15)
BUN: 59 mg/dL — ABNORMAL HIGH (ref 6–20)
BUN: 71 mg/dL — ABNORMAL HIGH (ref 6–20)
CO2: 20 mmol/L — ABNORMAL LOW (ref 22–32)
CO2: 20 mmol/L — ABNORMAL LOW (ref 22–32)
Calcium: 8.7 mg/dL — ABNORMAL LOW (ref 8.9–10.3)
Calcium: 8.6 mg/dL — ABNORMAL LOW (ref 8.9–10.3)
Chloride: 102 mmol/L (ref 98–111)
Chloride: 101 mmol/L (ref 98–111)
Creatinine, Ser: 2.72 mg/dL — ABNORMAL HIGH (ref 0.61–1.24)
Creatinine, Ser: 3.43 mg/dL — ABNORMAL HIGH (ref 0.61–1.24)
GFR calc Af Amer: 34 mL/min — ABNORMAL LOW (ref >60)
GFR calc Af Amer: 26 mL/min — ABNORMAL LOW (ref >60)
GFR calc non Af Amer: 29 mL/min — ABNORMAL LOW (ref >60)
GFR calc non Af Amer: 22 mL/min — ABNORMAL LOW (ref >60)
Glucose, Bld: 138 mg/dL — ABNORMAL HIGH (ref 70–99)
Glucose, Bld: 155 mg/dL — ABNORMAL HIGH (ref 70–99)
Phosphorus: 4 mg/dL (ref 2.5–4.6)
Phosphorus: 4.5 mg/dL (ref 2.5–4.6)
Potassium: 4.6 mmol/L (ref 3.5–5.1)
Potassium: 4.7 mmol/L (ref 3.5–5.1)
Sodium: 134 mmol/L — ABNORMAL LOW (ref 135–145)
Sodium: 134 mmol/L — ABNORMAL LOW (ref 135–145)

## 2019-05-04 LAB — COOXEMETRY PANEL
Carboxyhemoglobin: 1.6 % — ABNORMAL HIGH (ref 0.5–1.5)
Methemoglobin: 1.2 % (ref 0.0–1.5)
O2 Saturation: 42 %
Total hemoglobin: 11.6 g/dL — ABNORMAL LOW (ref 12.0–16.0)

## 2019-05-04 LAB — HEPATIC FUNCTION PANEL
ALT: 192 U/L — ABNORMAL HIGH (ref 0–44)
AST: 57 U/L — ABNORMAL HIGH (ref 15–41)
Albumin: 2.5 g/dL — ABNORMAL LOW (ref 3.5–5.0)
Alkaline Phosphatase: 117 U/L (ref 38–126)
Bilirubin, Direct: 0.4 mg/dL — ABNORMAL HIGH (ref 0.0–0.2)
Indirect Bilirubin: 1.2 mg/dL — ABNORMAL HIGH (ref 0.3–0.9)
Total Bilirubin: 1.6 mg/dL — ABNORMAL HIGH (ref 0.3–1.2)
Total Protein: 7.4 g/dL (ref 6.5–8.1)

## 2019-05-04 LAB — APTT: aPTT: 57 seconds — ABNORMAL HIGH (ref 24–36)

## 2019-05-04 LAB — MAGNESIUM: Magnesium: 2.7 mg/dL — ABNORMAL HIGH (ref 1.7–2.4)

## 2019-05-04 MED ORDER — PRISMASOL B22GK 4/0 22-4 MEQ/L REPLACEMENT SOLN
Status: DC
  Filled 2019-05-04 (×2): qty 5000

## 2019-05-04 MED ORDER — ATORVASTATIN CALCIUM 40 MG PO TABS
40.0000 mg | ORAL_TABLET | Freq: Every day | ORAL | Status: DC
  Administered 2019-05-04 – 2019-07-09 (×62): 40 mg
  Filled 2019-05-04 (×63): qty 1

## 2019-05-04 MED ORDER — ACD FORMULA A 0.73-2.45-2.2 GM/100ML VI SOLN
3000.0000 mL | Status: DC
  Administered 2019-05-04 – 2019-05-05 (×6): 3000 mL via INTRAVENOUS_CENTRAL
  Filled 2019-05-04 (×3): qty 3000

## 2019-05-04 MED ORDER — ACD FORMULA A 0.73-2.45-2.2 GM/100ML VI SOLN
3000.0000 mL | Status: DC
  Filled 2019-05-04: qty 3000

## 2019-05-04 MED ORDER — DEXTROSE 5 % IV SOLN
20.0000 g | INTRAVENOUS | Status: DC
  Administered 2019-05-04 (×2): 20 g via INTRAVENOUS_CENTRAL
  Filled 2019-05-04 (×2): qty 200

## 2019-05-04 MED ORDER — PRISMASOL B22GK 4/0 22-4 MEQ/L REPLACEMENT SOLN
Status: DC
  Filled 2019-05-04 (×4): qty 5000

## 2019-05-04 MED ORDER — ASPIRIN 81 MG PO CHEW
81.0000 mg | CHEWABLE_TABLET | Freq: Every day | ORAL | Status: DC
  Administered 2019-05-04 – 2019-07-09 (×62): 81 mg
  Filled 2019-05-04 (×63): qty 1

## 2019-05-04 MED ORDER — PRISMASOL B22GK 4/0 22-4 MEQ/L IV SOLN
INTRAVENOUS | Status: DC
  Filled 2019-05-04 (×13): qty 5000

## 2019-05-04 MED ORDER — ANTICOAGULANT SODIUM CITRATE 4% (200MG/5ML) IV SOLN
5.0000 mL | Status: DC | PRN
  Filled 2019-05-04: qty 5

## 2019-05-04 MED ORDER — SODIUM CHLORIDE 0.9 % FOR CRRT
INTRAVENOUS_CENTRAL | Status: DC | PRN
  Filled 2019-05-04: qty 1000

## 2019-05-04 NOTE — Progress Notes (Signed)
The chaplain followed up with the patient to offer prayer. The chaplain will continue to follow this patient.  Brion Aliment Chaplain Resident For questions concerning this note please contact me by pager [phone]

## 2019-05-04 NOTE — Progress Notes (Signed)
NAME:  Carlos Dearden., MRN:  [id number], DOB:  [birth year], LOS: 12 ADMISSION DATE:  05/07/2019, CONSULTATION DATE:  04/30/2019 REFERRING MD:  Dr. Leonel Ramsay, CHIEF COMPLAINT:  Slurred speech  Brief History   34 yo male smoker found to have slurred speech and Rt sided weakness.  CT head showed M2 occlusion.  Ultimately treated with thrombolytic and thrombectomy by IR.  Required intubation for airway protection. Course complicated by septic shock, AKI requiring CRRT and polymorphic VT  Past Medical History  Systolic CHF with non ischemic CM, Cocaine abuse, OSA, DM Hx of CHF (EF 15%)  Hx myocarditis Smoker 1/2 ppd   Significant Hospital Events   2/05 Admit, tPA, IR thrombectomy 2/6 100% on fvent at 2300  Overnight requiring increasing levophed and phenylephrine.    2/6: switched to levophed, epi, started antibiotics, started on CRRT.  2/9  developed wide-complex tachycardia and hypotension, started on amiodarone drip, increase Levophed drip 2/15 drop in platelets, heparin stopped, bivalved started  Consults:  Neuro IR  Procedures:  ETT 2/05 >>  [facility] CVL 2/5 >> [facility] HD cath 2/6 >>  2/5-2/6:  TPA given at 428 am (total of 90 Mg) 520 am went to IR  S/P Lt common carotid arteriogram followed by complete revascularization of occluded LT MCA sup division mid M2 seg with x 1 pass with 24mx 40 mm solitaire X ret river device and penumbra aspiration with TICI 3 revascularization   Significant Diagnostic Tests:  CT angio head/neck 2/05 >> occlusion of Lt MCA bifurcation Echo 2/05 >> EF less than 20%, cannot rule out apical thrombus MRI brain 2/11 > extensive acute infarction of multiple areas without large or medium vessel occlusion Echocardiogram 2/8 Left ventricular ejection fraction, by estimation, is <20%. The left  ventricle has severely decreased function. The left ventricle demonstrates  global hypokinesis. The left ventricular internal cavity size was severely    dilated. Possible small 0.8 x  0.6 cm apical thrombus. Somewhat subtle finding, could confirm with  cardiac MRI. However, if patient has had CVA, would be reasonable to  anticoagulate given severity of LV dysfunction if no contraindications to  anticoagulation.   Micro Data:  SARS CoV2 PCR 2/05 >> negative Influenza PCR 2/05 >> negative Urine 2/6 >> ng resp 2/6 >> nml flora BC 2/6 >>ng  Antimicrobials:  mero 2/6 -2/12 Zosyn 2/6 vanc 2/6 >> 2/9  Interim history/subjective:  Remains off pressors On low-dose Precedex, critically ill, intubated On CRRT, minimal urine output Still has bite block    Objective   Blood pressure 101/73, pulse 73, temperature (!) 97.2 F (36.2 C), temperature source Axillary, resp. rate (!) 24, height 6' 1"  (1.854 m), weight 120 kg, SpO2 100 %. CVP:  [15 mmHg-21 mmHg] 20 mmHg  Vent Mode: PRVC FiO2 (%):  [40 %] 40 % Set Rate:  [24 bmp] 24 bmp Vt Set:  [630 mL] 630 mL PEEP:  [5 cmH20] 5 cmH20 Pressure Support:  [8 cmH20] 8 cmH20 Plateau Pressure:  [23 cmH20-25 cmH20] 23 cmH20   Intake/Output Summary (Last 24 hours) at 05/04/2019 1043 Last data filed at 05/04/2019 1000 Gross per 24 hour  Intake 1952.32 ml  Output 3024 ml  Net -1071.68 ml   Filed Weights   05/02/19 0330 05/03/19 0500 05/04/19 0500  Weight: 119.3 kg 119 kg 120 kg    Examination:  General -young well-built man, intubated, in no distress Eyes -pupils reactive , mild pallor, no icterus ENT -oral ET tube in place, bite block  Cardiac -S1-S2 normal, no murmur Chest - bilateral ventilated breath sounds, mild blood-tinged secretions Abdomen - soft, non tender, + bowel sounds Extremities - no cyanosis, clubbing, or edema Skin - no rashes Neuro -unchanged, spontaneous eye opening, does not track, does not follow commands   Chest x-ray 2/16 personally reviewed shows patchy opacity in the left lung and right base, slight increase compared to 2/13  Labs show no leukocytosis,  stable anemia, LFTs have decreased, mild hyponatremia Resolved Hospital Problem list     Assessment & Plan:   Septic shock vs cardiogenic shock -resolved -Co-ox again low 2/17 (off Levophed ) , defer to cardiology regarding need for inotropes -Completed 7 days of meropenem for UTI/pneumonia  Cardiogenic shock Left ventricle apical thrombus non ischemic cardiomyopathy with systolic congestive heart failure Polymorphic VT on 2/9 -Cardiology following -Continue amiodarone PO -Add low-dose Coreg if remains off Levophed -Lipitor, digoxin -Medications on hold include Coreg, Zaroxolyn, Entresto, Aldactone, torsemide  Acute hypoxemic respiratory failure -Start spontaneous breathing trials -Plan for tracheostomy, noted platelets improving   Acute kidney injury -Remains anuric, no evidence of renal recovery -On CRRT , negative to equal balance as BP permits  -Transition to intermittent dialysis if remains off pressors -May need lines changed towards the end of the week   Left MCA CVA Multiple other infarcts noted on MRI [id number]/[id number] embolic Acute encephalopathy -Intermittent fentanyl, versed -Off Precedex    Thrombocytopenia - HIT +, await SRA -Continue Angiomax, avoid heparin (listed as allergy now)   DM type II poorly controlled. - SSI -Ct 20 units Levemir daily -Ct 4 units NovoLog every 4 hours TF coverage -CBG 1 40-180 acceptable  Hx of cocaine abuse. depression. - not active per UDS  - continue prozac  Summary-plan is to push forward with tracheostomy, remains to be seen if he will have renal recovery now that he is off pressors  Best practice:  Diet: NPO,  TFs DVT prophylaxis: SCDs GI prophylaxis: protonix Mobility: bed rest Code Status: full code Disposition: ICU Family -fianc [name] & mom updated 2/16  The patient is critically ill with multiple organ systems failure and requires high complexity decision making for assessment and support, frequent  evaluation and titration of therapies, application of advanced monitoring technologies and extensive interpretation of multiple databases. Critical Care Time devoted to patient care services described in this note independent of APP/resident  time is 33 minutes.     [name] MD. [facility]. [facility]  If no response to pager , please call [phone]   05/04/2019

## 2019-05-04 NOTE — Progress Notes (Signed)
Patient ID: Carlos Lant., male   DOB: 01/02/1986, 34 y.o.   MRN: [id number] S: REmains off of pressors but no significant improvement of CVP with UF O:BP 122/88   Pulse (!) 105   Temp 98 F (36.7 C) (Axillary)   Resp (!) 24   Ht _0  (1.854 m)   Wt 120 kg   SpO2 100%   BMI 34.90 kg/m   Intake/Output Summary (Last 24 hours) at 05/04/2019 1328 Last data filed at 05/04/2019 1300 Gross per 24 hour  Intake 2005.76 ml  Output 3128 ml  Net -1122.24 ml   Intake/Output: I/O last 3 completed shifts: In: 2801.5 [I.V.:841.5; NG/GT:1960] Out: 2595 [GLOVF:6433]  Intake/Output this shift:  Total I/O In: 459.1 [I.V.:159.1; NG/GT:300] Out: 688 [Other:688] Weight change: 1 kg Gen: intubated and sedated CVS: tachy Resp: occ rhonchi Abd: obese, +BS, soft, NT/ND Ext: no edema  Recent Labs  Lab 04/28/19 0549 04/28/19 1602 04/29/19 0512 04/29/19 1600 04/30/19 0437 04/30/19 1612 05/01/19 0437 05/01/19 0437 05/01/19 1610 05/02/19 0330 05/02/19 1655 05/03/19 0358 05/03/19 1645 05/04/19 0427 05/04/19 0453  NA 137   < > 136   < > 135   < > 135  --  133* 134* 134* 135 134* 134*  --   K 4.0   < > 4.0   < > 4.0   < > 4.1  --  4.4 4.1 3.9 4.0 4.2 4.6  --   CL 97*   < > 99   < > 100   < > 101  --  100 101 100 102 102 102  --   CO2 24   < > 23   < > 21*   < > 22  --  21* 20* 21* 22 22 20*  --   GLUCOSE 138*   < > 222*   < > 174*   < > 182*  --  156* 207* 196* 175* 182* 138*  --   BUN 54*   < > 63*   < > 61*   < > 58*  --  58* 62* 61* 62* 56* 59*  --   CREATININE 3.11*   < > 3.23*   < > 3.02*   < > 3.05*  --  3.06* 3.11* 2.79* 3.02* 2.85* 2.72*  --   ALBUMIN 2.8*  2.8*   < > 2.9*  2.8*   < > 2.8*  2.8*   < > 2.7*   < > 2.6* 2.6* 2.5* 2.4* 2.5* 2.5* 2.5*  CALCIUM 8.7*   < > 8.8*   < > 8.9   < > 9.0  --  8.6* 8.7* 8.5* 8.8* 8.6* 8.7*  --   PHOS 3.3   < > 4.3   < > 4.8*  5.0*   < > 5.0*  --  5.2* 4.8* 5.2* 4.2 4.6 4.0  --   AST 402*  --  216*  --  129*  --   --   --   --   --    --   --   --   --  57*  ALT 1,909*  --  1,338*  --  897*  --   --   --   --   --   --   --   --   --  192*   < > = values in this interval not displayed.   Liver Function Tests: Recent Labs  Lab 04/29/19 0512 04/29/19 1600 04/30/19 0437  04/30/19 1612 05/03/19 1645 05/04/19 0427 05/04/19 0453  AST 216*  --  129*  --   --   --  57*  ALT 1,338*  --  897*  --   --   --  192*  ALKPHOS 160*  --  152*  --   --   --  117  BILITOT 1.8*  --  1.8*  --   --   --  1.6*  PROT 6.9  --  7.0  --   --   --  7.4  ALBUMIN 2.9*  2.8*   < > 2.8*  2.8*   < > 2.5* 2.5* 2.5*   < > = values in this interval not displayed.   No results for input(s): LIPASE, AMYLASE in the last 168 hours. No results for input(s): AMMONIA in the last 168 hours. CBC: Recent Labs  Lab 04/30/19 0437 04/30/19 0437 05/01/19 0437 05/01/19 0437 05/02/19 0330 05/03/19 0358 05/04/19 0427  WBC 19.9*   < > 14.2*   < > 11.8* 9.4 9.9  HGB 13.1   < > 13.0   < > 12.4* 11.3* 11.1*  HCT 41.2   < > 40.5   < > 37.9* 35.2* 35.0*  MCV 96.3  --  97.6  --  95.2 96.2 95.6  PLT 118*   < > 92*   < > 49* 53* 65*   < > = values in this interval not displayed.   Cardiac Enzymes: No results for input(s): CKTOTAL, CKMB, CKMBINDEX, TROPONINI in the last 168 hours. CBG: Recent Labs  Lab 05/03/19 1934 05/03/19 2325 05/04/19 0326 05/04/19 0755 05/04/19 1126  GLUCAP 150* 156* 94 101* 131*    Iron Studies: No results for input(s): IRON, TIBC, TRANSFERRIN, FERRITIN in the last 72 hours. Studies/Results: DG Chest Port 1 View  Result Date: 05/03/2019 CLINICAL DATA:  Acute respiratory failure. EXAM: PORTABLE CHEST 1 VIEW COMPARISON:  Radiograph 04/30/2019 FINDINGS: Endotracheal tube tip 4.1 cm from the carina. Enteric tube in place with tip below the diaphragm not included in the field of view. Left internal central venous catheter tip projects over the upper SVC. Right jugular catheter in the region of the brachiocephalic vein. Stable  cardiomegaly. Worsening patchy opacities in the left lung and right lung base. No pneumothorax. IMPRESSION: 1. Coarsening patchy opacity in the left lung and right lung base, asymmetric edema versus infection. 2. Stable cardiomegaly. 3. Stable support apparatus. Electronically Signed   By: Keith Rake M.D.   On: 05/03/2019 06:36   . sodium chloride   Intravenous Once  . amiodarone  200 mg Per Tube BID  . aspirin  81 mg Per Tube Daily  . atorvastatin  40 mg Per Tube q1800  . chlorhexidine gluconate (MEDLINE KIT)  15 mL Mouth Rinse BID  . Chlorhexidine Gluconate Cloth  6 each Topical Q0600  . feeding supplement (PRO-STAT SUGAR FREE 64)  60 mL Per Tube TID  . fentaNYL (SUBLIMAZE) injection  50 mcg Intravenous Once  . insulin aspart  0-20 Units Subcutaneous Q4H  . insulin aspart  4 Units Subcutaneous Q4H  . insulin detemir  20 Units Subcutaneous Daily  . mouth rinse  15 mL Mouth Rinse 10 times per day  . pantoprazole sodium  40 mg Per Tube Q1200  . sodium chloride flush  10-40 mL Intracatheter Q12H  . sodium chloride flush  3 mL Intravenous Q12H    BMET    Component Value Date/Time   NA 134 (L) 05/04/2019 5638  K 4.6 05/04/2019 0427   CL 102 05/04/2019 0427   CO2 20 (L) 05/04/2019 0427   GLUCOSE 138 (H) 05/04/2019 0427   BUN 59 (H) 05/04/2019 0427   CREATININE 2.72 (H) 05/04/2019 0427   CALCIUM 8.7 (L) 05/04/2019 0427   GFRNONAA 29 (L) 05/04/2019 0427   GFRAA 34 (L) 05/04/2019 0427   CBC    Component Value Date/Time   WBC 9.9 05/04/2019 0427   RBC 3.66 (L) 05/04/2019 0427   HGB 11.1 (L) 05/04/2019 0427   HCT 35.0 (L) 05/04/2019 0427   PLT 65 (L) 05/04/2019 0427   MCV 95.6 05/04/2019 0427   MCH 30.3 05/04/2019 0427   MCHC 31.7 05/04/2019 0427   RDW 14.4 05/04/2019 0427   LYMPHSABS 0.8 04/24/2019 0843   MONOABS 0.5 04/24/2019 0843   EOSABS 0.4 04/24/2019 0843   BASOSABS 0.0 04/24/2019 0843    Assessment/Plan:  1. AKIpresumably due to ATN with  ischemic/nephrotoxic insults worsened by cardiogenic shock. Started on CRRT 04/23/19. Remains oliguric and dialysis dependent but was able to come off of pressors last night.  1. Continue with CVVHD for now as we are unable to transition to IHD at this time. 2. Increase UF rate to 50-100 ml/hr since his CVP is 19 2. Cardiogenic shock- currently off of levophed. 3. Acute stroke- presumed embolic with bilateral infarcts and LV thrombus- received tpa on admission 4. Vascular access- will need a new HD catheter placement as the current catheter has been in place since 04/23/19.  Hopefully we can stop CVVHD sometime tomorrow and pull line and replace on 05/06/19 if platelets improve.  Discussed with Dr. Elsworth Soho of PCCM who has kindly agreed for placement. 5. SVT/polymorphic VT- on amiodarone per Cardiology 6. Acute hypoxic respiratory failure- on vent and for trach soon if he remains off of pressors and platelets improve per PCCM 7. Thrombocytopenia- heparin discontinued. HIT panel ordered.  Platelets improving. Donetta Potts, MD [employer] [phone]

## 2019-05-04 NOTE — Progress Notes (Signed)
STROKE TEAM PROGRESS NOTE   INTERVAL HISTORY Patient remains critically ill and intubated for respiratory failure, renal failure, heart failure and with heparin-induced thrombocytopenia.  Platelet counts, liver enzymes and renal function appears to be improving neurological exam remains quite poor with patient intermittently having roving horizontal eye movements but will not follow commands.  He does withdraw lower extremities partially to painful stimuli but not the upper extremities.   OBJECTIVE Vitals:   05/04/19 0600 05/04/19 0700 05/04/19 0719 05/04/19 0800  BP: 115/90 90/61 90/61  101/73  Pulse: 89 65 64 73  Resp: (!) 21 (!) 24 (!) 24 (!) 24  Temp:    (!) 97.2 F (36.2 C)  TempSrc:    Axillary  SpO2: 100% 100% 100% 100%  Weight:      Height:       CBC:  Recent Labs  Lab 05/03/19 0358 05/04/19 0427  WBC 9.4 9.9  HGB 11.3* 11.1*  HCT 35.2* 35.0*  MCV 96.2 95.6  PLT 53* 65*   Basic Metabolic Panel:  Recent Labs  Lab 05/03/19 0358 05/03/19 0358 05/03/19 1645 05/04/19 0427  NA 135   < > 134* 134*  K 4.0   < > 4.2 4.6  CL 102   < > 102 102  CO2 22   < > 22 20*  GLUCOSE 175*   < > 182* 138*  BUN 62*   < > 56* 59*  CREATININE 3.02*   < > 2.85* 2.72*  CALCIUM 8.8*   < > 8.6* 8.7*  MG 2.7*  --   --  2.7*  PHOS 4.2   < > 4.6 4.0   < > = values in this interval not displayed.    IMAGING CT HEAD CODE STROKE WO CONTRAST 04/21/2019 1. Asymmetric hyperdensity of a Left MCA branch in the Sylvian fissure suspicious for emergent large vessel occlusion in the setting.  2. No associated acute cortically based infarct, ASPECTS 10. No acute hemorrhage.  3. Chronic right frontal operculum encephalomalacia.   CT Code Stroke CTA Head W/WO contrast CT Code Stroke CTA Neck W/WO contrast 04/29/2019 1. Positive for emergent large vessel occlusion at the Left MCA bifurcation. There is reconstitution of the dominant posterior division branch.  2. Elsewhere negative CTA head and neck.  Dominant right vertebral artery which supplies the basilar. Fetal type PCA origins.   CT CEREBRAL PERFUSION W CONTRAST 05/01/2019 1. CTP detects evidence of Left MCA territory oligemia concordant with the CTA findings.  2. No infarct core detected with standard CBF <30%.  3. 60 mL of penumbra detected with standard T-max > 6s.   CT HEAD WO CONTRAST 04/23/2019 Small region of loss of gray-white differentiation in the left frontal operculum consistent with a fairly small area of acute infarction compared to the previous perfusion abnormality. No hemorrhage or mass effect. Areas of low-density in the cerebellum that I favor are artifactual. There did not seem to be any abnormalities in that region on the previous examinations.   MRI / MRA Head WO Contrast  04/28/2019 Extensive acute infarction in the right cerebellum with swelling and petechial blood products. Acute infarction of the superior cerebellar peduncle on the right, with some infarction in the mid brain. Extensive acute infarction in the right temporal lobe and insula. Small acute infarctions in the frontal operculum region and right parietal region. Infarction affecting the right posterior basal ganglia and caudate nucleus. Petechial blood products present without frank hematoma. Acute infarction in the left insula and frontal operculum.  Petechial blood products present without frank hematoma. Small punctate acute infarctions higher in the left frontoparietal junction region. No large or medium vessel occlusion seen presently. Restoration of flow in the left MCA. Flow appears to be present in posterior circulation main vessels and branch vessels presently.   DG CHEST PORT 1 VIEW 05/03/2019 1. Coarsening patchy opacity in the left lung and right lung base, asymmetric edema versus infection. 2. Stable cardiomegaly. 3. Stable support apparatus. 04/30/2019 Improvement of bilateral airspace disease. 04/29/2019 Patchy opacities consistent with the  given clinical history. Tubes and lines as described.  04/27/2019 Perhaps slightly improved aeration at the left lung base. Otherwise no change in the bilateral patchy airspace and interstitial changes, likely associated with small effusions. Lines and tubes unchanged. 04/26/2019 0854 1. Lines and tubes as described above. 2. Cardiomegaly with basilar airspace disease, likely associated with effusions. 3. Interstitial and alveolar opacities elsewhere may represent a combination of infection and edema and are little changed. 04/25/2019 0946 Overall improved aeration from the prior exam although persistent opacities and small effusions are noted. 04/23/19 1636 1. Interval placement of a right neck vascular sheath, tip positioned over the right brachiocephalic vein. 2.  Other support apparatus in unchanged position. 3. Slight interval increase in diffuse bilateral interstitial and heterogeneous airspace opacity, consistent with multifocal infection, edema, and/or ARDS. 4. Cardiomegaly. 04/23/2019 0757 Increasing patchy bilateral airspace opacities which may represent edema or infection. 04/20/2019 1958 1. Interval placement of neck vascular catheter, tip positioned near the superior cavoatrial junction.  2. Interval retraction of endotracheal tube, now positioned just above the thoracic inlet, approximately 6.5 cm above the carina.  3. Interval placement of esophagogastric tube, tip and side port below the diaphragm. 4. Stable cardiomegaly and diffuse bilateral interstitial airspace opacity, likely edema.  05/13/2019 0841 1. Endotracheal tube tip noted just above the carina. Proximal repositioning of 2-3 should be considered. 2. Cardiomegaly again noted. Diffuse bilateral pulmonary infiltrates/edema. Small left pleural effusion.   ECHOCARDIOGRAM COMPLETE 05/05/2019 1. Left ventricular ejection fraction, by visual estimation, is <20%. The left ventricle has severely decreased function. There is no left ventricular  hypertrophy.   2. Apex is not well-visualized. In setting of stroke with severe LV systolic dysfunction, recommend repeat TTE with contrast to rule out LV apical thrombus   3. Severely dilated left ventricular internal cavity size.   4. Left ventricular diastolic parameters are consistent with Grade III diastolic dysfunction (restrictive).   5. Elevated left atrial pressure.   6. The left ventricle demonstrates global hypokinesis.   7. Global right ventricle has normal systolic function.The right ventricular size is mildly enlarged.   8. Left atrial size was moderately dilated.   9. Right atrial size was normal.  10. The mitral valve is normal in structure. Mild mitral valve regurgitation.  11. The tricuspid valve is normal in structure. Tricuspid valve regurgitation is trivial.  12. The aortic valve is normal in structure. Aortic valve regurgitation is not visualized. No evidence of aortic valve sclerosis or stenosis.  13. The pulmonic valve was not well visualized. Pulmonic valve regurgitation is not visualized.   Interventional Neuro Radiology - Cerebral Angiogram with Intervention 05/13/2019 S/P Lt common carotid arteriogram followed by complete revascularization of occluded LT MCA sup division mid M2 seg with x 1 pass with 33mx 40 mm solitaire X ret river device and penumbra aspiration with TICI 3 revascularization  ECG - SR rate 93 BPM. (See cardiology reading for complete details)  PHYSICAL EXAM  Temp:  [97.1 F (36.2 C)-97.7 F (36.5 C)] 97.2 F (36.2 C) (02/17 0800) Pulse Rate:  [52-103] 73 (02/17 0800) Resp:  [13-25] 24 (02/17 0800) BP: (84-146)/(42-114) 101/73 (02/17 0800) SpO2:  [99 %-100 %] 100 % (02/17 0800) Arterial Line BP: (90-148)/(64-97) 98/70 (02/17 0800) FiO2 (%):  [40 %] 40 % (02/17 0800) Weight:  [120 kg] 120 kg (02/17 0500)  General - obese young african [place] male, intubated off precedex.  Ophthalmologic - fundi not visualized due to  noncooperation.  Cardiovascular - Regular rhythm and rate.  Neuro - intubated    Eyes are closed but able to open with repetitive stimulation. He does not follow, commands or gaze but has intermittent tracking to side of voice and blinking to visual threat but not consistent. PERRL. Corneal reflex present, gag and cough present. Breathing over the vent. Facial symmetry not able to test due to ET tube.  Tongue protrusion not cooperative. On pain stimulation, no movement of both upper extremities but has trace withdrawal in both lower extremities.Marland Kitchen DTR 1+ and bilateral positive babinski.  He does not grimace to painful stimuli.   ASSESSMENT/PLAN Mr. Jakobie Henslee. is a 34 y.o. male with history of NICM EF 15-20%, CHF, DM, Obesity, tobacco use, OSA, and hx of cocaine use presenting with aphasia and right sided weakness. The patient received IV t-PA on Friday 04/20/2019 at 4:45 AM. Mechanical thrombectomy left M2.  Stroke:  Extensive R cerebellar, midbrain, R MCA and L MCA infarcts s/p tPA & IR with left M2 TICI3 reperfusion - embolic - likely secondary to severe cardiomyopathy with low EF and LV thrombus  Code Stroke CT Head - left MCA hyperdensity.   CTA H&N - emergent large vessel occlusion at the Left MCA bifurcation.   CT Perfusion - 60 mL of penumbra detected.   CT head repeat - Small region of loss of gray-white differentiation in the left frontal operculum   MRI head - 04/28/2019 - extensive R cerebellar infarct w/ petechial hemorrhage. R cerebellar peduncle and midbrain infarct. Extensive R temporal lobe and insula infarct w/ R frontal operculum and parietal infarcts. R posterior basal ganglia and caudate head infarct w/ petechial hemorrhage. L insula and L frontal operculum infarcts w/ petechial hemorrhage. Small L frontoparietal jxn infarcts.   MRA head restoration L MCA flow. Flow in posterior circulation.   2D Echo - EF < 20%   2D with contrast - possible LV apical thrombus  0.8 x 0.6  Sars Corona Virus 2 - negative  LDL - 203  HgbA1c - 6.5  VTE prophylaxis - angiomax IV  aspirin 81 mg daily prior to admission, IV heparin on hold d/t thrombocytopenia, now on angiomax. HIT elevated. SRA pending. As PLTS increasing, added low dose aspirin 81 to Angiomax.     Therapy recommendations:  pending - order when able to participate   Disposition:  Pending  Change neuro checks to q2h  Encephalopathy  Not following commands or moving extremities  MRI extensive R and L brain and R cerebellar infarcts  Prognosis is poor, concerning for vegetative state vs minimally conscious state  Fiance present on camera during rounds again today. Clarified that mother is next of kin. Fiance designated by pt on arrival to receive information. Fiance agreeable to clarification. Ongoing GOC discussion  ? HIT  On heparin IV for LV thrombus  Platelet [id number]  Heparin discontinued  HIT panel sent - HIT ab elevated. SRA pending    On Angiomax IV  CCM  on board  Platelet monitoring  Acute Hypoxemic Respiratory failure  Intubated  Off sedation now   CCM on board  CXR - 05/03/19 - coarsening patchy opacity L and R lung bases edema vs infection  Prn precedex as BP will allow  For trach   Starting spontaneous breathing trails  Cardiogenic vs. Septic shock  Fever, TMax afebrile  Leukocytosis, WBCs 9.4  CXR - 04/23/19 - consistent with multifocal infection, edema, and/or ARDS (unchanged) CXR - 04/30/19 - Improvement of bilateral airspace disease.  On Meropenum 04/23/19>>04/29/19  On Vancomycin 04/23/19>> 04/26/19  On hydrocortisone->off  U/A neg, Urine culture no growth   Blood cultures 2/6 neg   resp Cx 2/16 reincubated  Off pressors    CCM plans low dose coreg, dig if remains off pressors   LV thrombus Severe nonischemic cardiomyopathy Chronic systolic CHF  2D Echo - EF < 20%   2D with contrast - possible LV apical thrombus 0.8 x  0.6  On heparin IV -> angiomax IV    On amiodarone gtt for wide-complex tachycardia and hypotension -> changed to po  Add beta blocker once BP allows  Cardiology on board  Polymorphic V-tach / SVT [place]  Elevated troponin Prolonged QT  On amiodarone drip -> now changed to po  Troponin trending down 16274->14226->2409   Cardiology on board  May consider cardioversion if needed  AKI  Creatinine - 2.72  BUN 59  Renal consult 04/23/19  Still minimal urinary output, no signs of renal recovery  On CRRT, pharmacist contacted about removing heparin from soln  Plan transition to intermittent dialysis  Hypotension w/ shock Hx Hypertension  Home BP meds: Coreg, Entresto  On pressors - Neo  SBP goal  >110  Stable  Shock liver d/t hypoperfusion, improving  GI on board  AST/ALT - >10000/10132 ->... ->57/192 (05/04/19)    INR 3.1->1.8->1.2  CK 602  Hyperlipidemia  Home Lipid lowering medication: Lipitor 40 mg daily  LDL 203, goal < 70  Ok to add back: Lipitor 40 mg daily   Will not use high intensity statin at this time given hx shock liver  Continue statin at discharge  Diabetes type II, uncontrolled  Home diabetic meds: insulin  Current diabetic meds: SS insulin, Levemir 20, novolog 4u q6h  HgbA1c 6.5, goal < 7.0  CBG monitoring   SSI  Dysphagia . Secondary to stroke . NPO . On tube feeding @ 50 . Speech on board  Tobacco abuse  Current smoker  Mom confirmed tobacco use  Smoking cessation counseling will be provided  Other Stroke Risk Factors  ETOH use, advised to drink no more than 1 alcoholic beverage per day.  Hx cocaine abuse, UDS neg   Obesity, Body mass index is 34.9 kg/m., recommend weight loss, diet and exercise as appropriate   Obstructive sleep apnea - uses Cpap  Substance Abuse Hx of cocaine use  Other Active Problems  Code status - Full code  Hyperkalemia - 4.0 resolved  Metabolic acidosis,  resolved  Depression on prozac  Hypermagnesemia - 2.7-2.7  Hyperphosphatemia - 4.8/5.0->5.0  Hospital day # 12  Neurological exam remains poor with patient only partially arousable but not following any commands with intermittent roving eye movements but not tracking consistently. Patient remains critically ill with respiratory failure requiring ventilatory support as well as CRRT for renal failure.  Platelet counts also appear to be low likely due to heparin-induced thrombocytopenia but appear to be improving   Discussed with Dr. Elsworth Soho. This patient is  critically ill and at significant risk of neurological worsening, death and care requires constant monitoring of vital signs, hemodynamics,respiratory and cardiac monitoring, extensive review of multiple databases, frequent neurological assessment, discussion with family, other specialists and medical decision making of high complexity.I have made any additions or clarifications directly to the above note.This critical care time does not reflect procedure time, or teaching time or supervisory time of PA/NP/Med Resident etc but could involve care discussion time.  I spent 30 minutes of neurocritical care time  in the care of  this patient.   Antony Contras, MD  To contact Stroke Continuity provider, please refer to [email or web address] After hours, contact General Neurology

## 2019-05-04 NOTE — Progress Notes (Signed)
[place] for Heparin >> Bivalirudin Indication: stroke 2/5, LV thrombus 2/8, HIT 2/15   Allergies  Allergen Reactions  . Heparin Other (See Comments)    Heparin induced thrombocytopenia. 2/15 HIT OD 1.692.     Patient Measurements: Height: 6\' 1"  (185.4 cm) Weight: 264 lb 8.8 oz (120 kg) IBW/kg (Calculated) : 79.9   Vital Signs: Temp: 97.2 F (36.2 C) (02/17 0800) Temp Source: Axillary (02/17 0800) BP: 101/73 (02/17 0800) Pulse Rate: 73 (02/17 0800)  Labs: Recent Labs    05/02/19 0330 05/02/19 1300 05/03/19 0358 05/03/19 1645 05/04/19 0427  HGB 12.4*  --  11.3*  --  11.1*  HCT 37.9*  --  35.2*  --  35.0*  PLT 49*  --  53*  --  65*  APTT  --    < > 58* 58* 57*  HEPARINUNFRC 0.34  --   --   --   --   CREATININE 3.11*   < > 3.02* 2.85* 2.72*   < > = values in this interval not displayed.    Estimated Creatinine Clearance: 51.9 mL/min (A) (by C-G formula based on SCr of 2.72 mg/dL (H)).  Assessment: 34 yr old male with left MCA infarct - received TPA and revascularization with IR on 2/5. On 2/8 found to have small LV apical thrombus on ECHO. Pharmacy consulted to dose IV heparin, which was switched to bivalirudin 2/15 for HIT.    APTT has been very stable x5 at goal. Confirmed with RN, no issues with infusion. No further bleeding in ET tube. H/H stable, platelets trending up. 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 SRA pending.    DC'd PRN heparin for CRRT, changed to sodium citrate.    Goal of Therapy:  APTT 50- 65 sec  Monitor platelets by anticoagulation protocol: Yes   Plan:  Continue bivalirudin at 0.05 mg/kg/hr (adjusted weight) F/u SRA result Monitor aPTT QAM given stability  Monitor daily CBC, plt and BMP at least Q 72 hr  Monitor for signs/symptoms of bleeding  Will need non-heparin anticoagulation for at least 3 months (= 07/24/19)  Benetta Spar, PharmD, BCPS, [place]  Pharmacist  Please check AMION for all [facility] phone numbers After 10:00 PM, call [place] [phone]

## 2019-05-04 NOTE — Progress Notes (Signed)
Patient clotted two CVVHDF within two hours. Dr.Coladonato notified, awaiting new orders. Lianne Bushy RN BSN.

## 2019-05-05 ENCOUNTER — Inpatient Hospital Stay (HOSPITAL_COMMUNITY): Payer: No Typology Code available for payment source

## 2019-05-05 DIAGNOSIS — J9601 Acute respiratory failure with hypoxia: Secondary | ICD-10-CM | POA: Diagnosis not present

## 2019-05-05 DIAGNOSIS — Z930 Tracheostomy status: Secondary | ICD-10-CM

## 2019-05-05 DIAGNOSIS — I6602 Occlusion and stenosis of left middle cerebral artery: Secondary | ICD-10-CM | POA: Diagnosis not present

## 2019-05-05 DIAGNOSIS — R570 Cardiogenic shock: Secondary | ICD-10-CM | POA: Diagnosis not present

## 2019-05-05 DIAGNOSIS — N179 Acute kidney failure, unspecified: Secondary | ICD-10-CM | POA: Diagnosis not present

## 2019-05-05 LAB — POCT I-STAT, CHEM 8
BUN: 51 mg/dL — ABNORMAL HIGH (ref 6–20)
BUN: 69 mg/dL — ABNORMAL HIGH (ref 6–20)
BUN: 47 mg/dL — ABNORMAL HIGH (ref 6–20)
BUN: 49 mg/dL — ABNORMAL HIGH (ref 6–20)
BUN: 57 mg/dL — ABNORMAL HIGH (ref 6–20)
BUN: 59 mg/dL — ABNORMAL HIGH (ref 6–20)
BUN: 60 mg/dL — ABNORMAL HIGH (ref 6–20)
BUN: 67 mg/dL — ABNORMAL HIGH (ref 6–20)
BUN: 44 mg/dL — ABNORMAL HIGH (ref 6–20)
BUN: 53 mg/dL — ABNORMAL HIGH (ref 6–20)
BUN: 56 mg/dL — ABNORMAL HIGH (ref 6–20)
BUN: 42 mg/dL — ABNORMAL HIGH (ref 6–20)
Calcium, Ion: 0.41 mmol/L — CL (ref 1.15–1.40)
Calcium, Ion: 0.58 mmol/L — CL (ref 1.15–1.40)
Calcium, Ion: <0.3 mmol/L — CL (ref 1.15–1.40)
Calcium, Ion: <0.3 mmol/L — CL (ref 1.15–1.40)
Calcium, Ion: 0.83 mmol/L — CL (ref 1.15–1.40)
Calcium, Ion: 0.89 mmol/L — CL (ref 1.15–1.40)
Calcium, Ion: 0.91 mmol/L — ABNORMAL LOW (ref 1.15–1.40)
Calcium, Ion: 0.93 mmol/L — ABNORMAL LOW (ref 1.15–1.40)
Calcium, Ion: <0.3 mmol/L — CL (ref 1.15–1.40)
Calcium, Ion: 0.85 mmol/L — CL (ref 1.15–1.40)
Calcium, Ion: 0.87 mmol/L — CL (ref 1.15–1.40)
Calcium, Ion: <0.3 mmol/L — CL (ref 1.15–1.40)
Chloride: 97 mmol/L — ABNORMAL LOW (ref 98–111)
Chloride: 97 mmol/L — ABNORMAL LOW (ref 98–111)
Chloride: 97 mmol/L — ABNORMAL LOW (ref 98–111)
Chloride: 97 mmol/L — ABNORMAL LOW (ref 98–111)
Chloride: 100 mmol/L (ref 98–111)
Chloride: 100 mmol/L (ref 98–111)
Chloride: 100 mmol/L (ref 98–111)
Chloride: 101 mmol/L (ref 98–111)
Chloride: 98 mmol/L (ref 98–111)
Chloride: 100 mmol/L (ref 98–111)
Chloride: 102 mmol/L (ref 98–111)
Chloride: 97 mmol/L — ABNORMAL LOW (ref 98–111)
Creatinine, Ser: 2.4 mg/dL — ABNORMAL HIGH (ref 0.61–1.24)
Creatinine, Ser: 3.3 mg/dL — ABNORMAL HIGH (ref 0.61–1.24)
Creatinine, Ser: 2 mg/dL — ABNORMAL HIGH (ref 0.61–1.24)
Creatinine, Ser: 2.1 mg/dL — ABNORMAL HIGH (ref 0.61–1.24)
Creatinine, Ser: 3.1 mg/dL — ABNORMAL HIGH (ref 0.61–1.24)
Creatinine, Ser: 3.2 mg/dL — ABNORMAL HIGH (ref 0.61–1.24)
Creatinine, Ser: 3.2 mg/dL — ABNORMAL HIGH (ref 0.61–1.24)
Creatinine, Ser: 3.4 mg/dL — ABNORMAL HIGH (ref 0.61–1.24)
Creatinine, Ser: 2 mg/dL — ABNORMAL HIGH (ref 0.61–1.24)
Creatinine, Ser: 3 mg/dL — ABNORMAL HIGH (ref 0.61–1.24)
Creatinine, Ser: 3 mg/dL — ABNORMAL HIGH (ref 0.61–1.24)
Creatinine, Ser: 2 mg/dL — ABNORMAL HIGH (ref 0.61–1.24)
Glucose, Bld: 248 mg/dL — ABNORMAL HIGH (ref 70–99)
Glucose, Bld: 250 mg/dL — ABNORMAL HIGH (ref 70–99)
Glucose, Bld: 220 mg/dL — ABNORMAL HIGH (ref 70–99)
Glucose, Bld: 235 mg/dL — ABNORMAL HIGH (ref 70–99)
Glucose, Bld: 211 mg/dL — ABNORMAL HIGH (ref 70–99)
Glucose, Bld: 222 mg/dL — ABNORMAL HIGH (ref 70–99)
Glucose, Bld: 255 mg/dL — ABNORMAL HIGH (ref 70–99)
Glucose, Bld: 261 mg/dL — ABNORMAL HIGH (ref 70–99)
Glucose, Bld: 223 mg/dL — ABNORMAL HIGH (ref 70–99)
Glucose, Bld: 181 mg/dL — ABNORMAL HIGH (ref 70–99)
Glucose, Bld: 215 mg/dL — ABNORMAL HIGH (ref 70–99)
Glucose, Bld: 200 mg/dL — ABNORMAL HIGH (ref 70–99)
HCT: 33 % — ABNORMAL LOW (ref 39.0–52.0)
HCT: 37 % — ABNORMAL LOW (ref 39.0–52.0)
HCT: 38 % — ABNORMAL LOW (ref 39.0–52.0)
HCT: 42 % (ref 39.0–52.0)
HCT: 31 % — ABNORMAL LOW (ref 39.0–52.0)
HCT: 33 % — ABNORMAL LOW (ref 39.0–52.0)
HCT: 34 % — ABNORMAL LOW (ref 39.0–52.0)
HCT: 36 % — ABNORMAL LOW (ref 39.0–52.0)
HCT: 41 % (ref 39.0–52.0)
HCT: 34 % — ABNORMAL LOW (ref 39.0–52.0)
HCT: 36 % — ABNORMAL LOW (ref 39.0–52.0)
HCT: 41 % (ref 39.0–52.0)
Hemoglobin: 11.2 g/dL — ABNORMAL LOW (ref 13.0–17.0)
Hemoglobin: 12.6 g/dL — ABNORMAL LOW (ref 13.0–17.0)
Hemoglobin: 12.9 g/dL — ABNORMAL LOW (ref 13.0–17.0)
Hemoglobin: 14.3 g/dL (ref 13.0–17.0)
Hemoglobin: 10.5 g/dL — ABNORMAL LOW (ref 13.0–17.0)
Hemoglobin: 11.2 g/dL — ABNORMAL LOW (ref 13.0–17.0)
Hemoglobin: 11.6 g/dL — ABNORMAL LOW (ref 13.0–17.0)
Hemoglobin: 12.2 g/dL — ABNORMAL LOW (ref 13.0–17.0)
Hemoglobin: 13.9 g/dL (ref 13.0–17.0)
Hemoglobin: 11.6 g/dL — ABNORMAL LOW (ref 13.0–17.0)
Hemoglobin: 12.2 g/dL — ABNORMAL LOW (ref 13.0–17.0)
Hemoglobin: 13.9 g/dL (ref 13.0–17.0)
Potassium: 4.1 mmol/L (ref 3.5–5.1)
Potassium: 4.3 mmol/L (ref 3.5–5.1)
Potassium: 4.2 mmol/L (ref 3.5–5.1)
Potassium: 4.3 mmol/L (ref 3.5–5.1)
Potassium: 4.2 mmol/L (ref 3.5–5.1)
Potassium: 4.3 mmol/L (ref 3.5–5.1)
Potassium: 4.3 mmol/L (ref 3.5–5.1)
Potassium: 4.6 mmol/L (ref 3.5–5.1)
Potassium: 4.2 mmol/L (ref 3.5–5.1)
Potassium: 4.7 mmol/L (ref 3.5–5.1)
Potassium: 4.8 mmol/L (ref 3.5–5.1)
Potassium: 4.4 mmol/L (ref 3.5–5.1)
Sodium: 137 mmol/L (ref 135–145)
Sodium: 139 mmol/L (ref 135–145)
Sodium: 139 mmol/L (ref 135–145)
Sodium: 139 mmol/L (ref 135–145)
Sodium: 135 mmol/L (ref 135–145)
Sodium: 136 mmol/L (ref 135–145)
Sodium: 136 mmol/L (ref 135–145)
Sodium: 137 mmol/L (ref 135–145)
Sodium: 138 mmol/L (ref 135–145)
Sodium: 136 mmol/L (ref 135–145)
Sodium: 136 mmol/L (ref 135–145)
Sodium: 139 mmol/L (ref 135–145)
TCO2: 25 mmol/L (ref 22–32)
TCO2: 25 mmol/L (ref 22–32)
TCO2: 24 mmol/L (ref 22–32)
TCO2: 25 mmol/L (ref 22–32)
TCO2: 24 mmol/L (ref 22–32)
TCO2: 24 mmol/L (ref 22–32)
TCO2: 25 mmol/L (ref 22–32)
TCO2: 25 mmol/L (ref 22–32)
TCO2: 24 mmol/L (ref 22–32)
TCO2: 24 mmol/L (ref 22–32)
TCO2: 24 mmol/L (ref 22–32)
TCO2: 26 mmol/L (ref 22–32)

## 2019-05-05 LAB — RENAL FUNCTION PANEL
Albumin: 2.6 g/dL — ABNORMAL LOW (ref 3.5–5.0)
Albumin: 2.5 g/dL — ABNORMAL LOW (ref 3.5–5.0)
Anion gap: 17 — ABNORMAL HIGH (ref 5–15)
Anion gap: 16 — ABNORMAL HIGH (ref 5–15)
BUN: 63 mg/dL — ABNORMAL HIGH (ref 6–20)
BUN: 79 mg/dL — ABNORMAL HIGH (ref 6–20)
CO2: 19 mmol/L — ABNORMAL LOW (ref 22–32)
CO2: 20 mmol/L — ABNORMAL LOW (ref 22–32)
Calcium: 7.8 mg/dL — ABNORMAL LOW (ref 8.9–10.3)
Calcium: 9.1 mg/dL (ref 8.9–10.3)
Chloride: 98 mmol/L (ref 98–111)
Chloride: 101 mmol/L (ref 98–111)
Creatinine, Ser: 3.05 mg/dL — ABNORMAL HIGH (ref 0.61–1.24)
Creatinine, Ser: 4.01 mg/dL — ABNORMAL HIGH (ref 0.61–1.24)
GFR calc Af Amer: 29 mL/min — ABNORMAL LOW (ref >60)
GFR calc Af Amer: 21 mL/min — ABNORMAL LOW (ref >60)
GFR calc non Af Amer: 25 mL/min — ABNORMAL LOW (ref >60)
GFR calc non Af Amer: 18 mL/min — ABNORMAL LOW (ref >60)
Glucose, Bld: 209 mg/dL — ABNORMAL HIGH (ref 70–99)
Glucose, Bld: 82 mg/dL (ref 70–99)
Phosphorus: 4.4 mg/dL (ref 2.5–4.6)
Phosphorus: 6.1 mg/dL — ABNORMAL HIGH (ref 2.5–4.6)
Potassium: 4.6 mmol/L (ref 3.5–5.1)
Potassium: 5.3 mmol/L — ABNORMAL HIGH (ref 3.5–5.1)
Sodium: 134 mmol/L — ABNORMAL LOW (ref 135–145)
Sodium: 137 mmol/L (ref 135–145)

## 2019-05-05 LAB — GLUCOSE, CAPILLARY
Glucose-Capillary: 281 mg/dL — ABNORMAL HIGH (ref 70–99)
Glucose-Capillary: 184 mg/dL — ABNORMAL HIGH (ref 70–99)
Glucose-Capillary: 163 mg/dL — ABNORMAL HIGH (ref 70–99)
Glucose-Capillary: 144 mg/dL — ABNORMAL HIGH (ref 70–99)
Glucose-Capillary: 82 mg/dL (ref 70–99)
Glucose-Capillary: 77 mg/dL (ref 70–99)
Glucose-Capillary: 110 mg/dL — ABNORMAL HIGH (ref 70–99)

## 2019-05-05 LAB — CBC
HCT: 35.2 % — ABNORMAL LOW (ref 39.0–52.0)
Hemoglobin: 11.2 g/dL — ABNORMAL LOW (ref 13.0–17.0)
MCH: 30.8 pg (ref 26.0–34.0)
MCHC: 31.8 g/dL (ref 30.0–36.0)
MCV: 96.7 fL (ref 80.0–100.0)
Platelets: 123 10*3/uL — ABNORMAL LOW (ref 150–400)
RBC: 3.64 MIL/uL — ABNORMAL LOW (ref 4.22–5.81)
RDW: 14.5 % (ref 11.5–15.5)
WBC: 16.3 10*3/uL — ABNORMAL HIGH (ref 4.0–10.5)
nRBC: 0 % (ref 0.0–0.2)

## 2019-05-05 LAB — APTT
aPTT: 49 seconds — ABNORMAL HIGH (ref 24–36)
aPTT: 53 seconds — ABNORMAL HIGH (ref 24–36)
aPTT: 57 seconds — ABNORMAL HIGH (ref 24–36)

## 2019-05-05 LAB — COOXEMETRY PANEL
Carboxyhemoglobin: 1.3 % (ref 0.5–1.5)
Methemoglobin: 1.2 % (ref 0.0–1.5)
O2 Saturation: 40.1 %
Total hemoglobin: 11.4 g/dL — ABNORMAL LOW (ref 12.0–16.0)

## 2019-05-05 LAB — CULTURE, RESPIRATORY W GRAM STAIN: Culture: NORMAL

## 2019-05-05 LAB — MAGNESIUM: Magnesium: 2.3 mg/dL (ref 1.7–2.4)

## 2019-05-05 MED ORDER — SODIUM CHLORIDE 0.9 % IV SOLN
0.0800 mg/kg/h | INTRAVENOUS | Status: DC
  Administered 2019-05-05 – 2019-05-07 (×2): 0.055 mg/kg/h via INTRAVENOUS
  Administered 2019-05-09 – 2019-05-11 (×2): 0.06 mg/kg/h via INTRAVENOUS
  Administered 2019-05-12: 0.08 mg/kg/h via INTRAVENOUS
  Filled 2019-05-05 (×7): qty 250

## 2019-05-05 MED ORDER — MIDAZOLAM HCL 2 MG/2ML IJ SOLN
2.0000 mg | Freq: Once | INTRAMUSCULAR | Status: AC
  Administered 2019-05-05: 13:00:00 2 mg via INTRAVENOUS

## 2019-05-05 MED ORDER — FENTANYL CITRATE (PF) 100 MCG/2ML IJ SOLN
INTRAMUSCULAR | Status: AC
  Filled 2019-05-05: qty 2

## 2019-05-05 MED ORDER — SODIUM CHLORIDE 0.9 % IV SOLN
0.0550 mg/kg/h | INTRAVENOUS | Status: DC
  Administered 2019-05-05: 0.055 mg/kg/h via INTRAVENOUS
  Filled 2019-05-05: qty 250

## 2019-05-05 MED ORDER — VITAL AF 1.2 CAL PO LIQD
1000.0000 mL | ORAL | Status: DC
  Administered 2019-05-06 – 2019-05-10 (×5): 1000 mL
  Filled 2019-05-05: qty 1000

## 2019-05-05 MED ORDER — MIDAZOLAM HCL 2 MG/2ML IJ SOLN
INTRAMUSCULAR | Status: AC
  Filled 2019-05-05: qty 2

## 2019-05-05 MED ORDER — FENTANYL CITRATE (PF) 100 MCG/2ML IJ SOLN
100.0000 ug | Freq: Once | INTRAMUSCULAR | Status: AC
  Administered 2019-05-05: 100 ug via INTRAVENOUS

## 2019-05-05 MED ORDER — ROCURONIUM BROMIDE 50 MG/5ML IV SOLN
80.0000 mg | Freq: Once | INTRAVENOUS | Status: AC
  Administered 2019-05-05: 13:00:00 80 mg via INTRAVENOUS

## 2019-05-05 MED ORDER — WHITE PETROLATUM EX OINT
TOPICAL_OINTMENT | CUTANEOUS | Status: AC
  Filled 2019-05-05: qty 28.35

## 2019-05-05 MED ORDER — ACD FORMULA A 0.73-2.45-2.2 GM/100ML VI SOLN
1000.0000 mL | Status: DC
  Filled 2019-05-05 (×4): qty 1000

## 2019-05-05 MED ORDER — ETOMIDATE 2 MG/ML IV SOLN
20.0000 mg | Freq: Once | INTRAVENOUS | Status: AC
  Administered 2019-05-05: 13:00:00 20 mg via INTRAVENOUS

## 2019-05-05 NOTE — Procedures (Signed)
Procedure: Percutaneous Tracheostomy CPT 31600 Performed by: Dr. Erskine Emery Bronchoscopy Assistant: Dr Tamala Julian   Indications: Chronic respiratory failure and need for ongoing mechanical ventilation.  Consent: Given patient's intubation and sedation, the patient was unable to provide consent. Discussed the procedure with the patient's decision maker, including the indications, risks, benefits, and alternatives. All questions were answered. Verbal witnessed consent was obtained and placed in the chart.  Preprocedure: Universal protocol was followed for this procedure. Prior to the initiation of sedation or the procedure, a timeout/"Pause for the Cause" was performed. The patient's identity was verified by confirming the patient's wrist band for name, date of birth, and medical record number. Everyone in the room was in agreement with the patient identify, the procedure to be performed, consent was in place and matched the planned procedure, and the procedure site. The area was cleaned with a CHG scrub and draped with large sterile barrier. Hand hygiene was performed, and cap, mask, sterile gown, and sterile gloves were worn. The patient was covered by a large sterile drape. Sterile technique was maintained for the entire procedure.  Anesthesia: The patient was intubated and sedated prior to the procedure. Additional midazolam, etomidate, fentanyl and rocuronium were given for sedation and paralysis with close attention to vital signs throughout procedure. Please refer to the accompanying procedural sedation form for additional details. Once the patient was adequately sedated and with continuous BIS monitoring, vecuronium was administered for paralysis.  Procedure: The patient was placed in the supine position. The anterior neck was prepped and draped in usual sterile fashion. 1% lidocaine was administered approximately 2 fingerbreadths above the sternal notch for local anesthesia. A 1.5-cm vertical incision  was then performed 2 fingerbreadths above the sternal notch. Using a curved Kelly, blunt dissection was performed down to the level of the pretracheal fascia. At this point, the bronchoscope was introduced through the endotracheal tube and the trachea was properly visualized. The endotracheal tube was then gradually withdrawn within the trachea under direct bronchoscopic visualization. Proper midline position was confirmed by bouncing the needle from the tracheostomy tray over the trachea with bronchoscopic examination. The needle was advanced into the trachea and proper positioning was confirmed with direct visualization. The needle was then removed leaving a white outer cannula in position. The wire from the tracheostomy tray was then advanced through the white outer cannula. The cannula was then removed. The small, blue dilator was then advanced over the wire into the trachea. Once proper dilatation was achieved, the dilator was removed. The large, tapered dilator was then advanced over the wire into the trachea. The dilator was removed leaving the wire and white inner cannula in position. A number 6 cuffed  percutaneous Shiley tracheostomy tube was then advanced over the wire and white inner cannula into the trachea. Proper positioning was confirmed with bronchoscopic visualization. The tracheostomy tube was then sutured in place with four nylon sutures. It was further secured with a tracheostomy tie.  Estimated blood loss: Less than 5 mL.  Complications: None immediate.  CXR ordered.  Erick Colace ACNP-BC [place] Pager # [phone] OR # [phone] if no answer

## 2019-05-05 NOTE — Progress Notes (Addendum)
[place] for Heparin >> Bivalirudin Indication: stroke 2/5, LV thrombus 2/8, HIT 2/15   Allergies  Allergen Reactions  . Heparin Other (See Comments)    Heparin induced thrombocytopenia. 2/15 HIT OD 1.692.     Patient Measurements: Height: 6\' 1"  (185.4 cm) Weight: 254 lb 6.6 oz (115.4 kg) IBW/kg (Calculated) : 79.9   Vital Signs: Temp: 99.8 F (37.7 C) (02/18 1646) Temp Source: Axillary (02/18 1646) BP: 122/87 (02/18 1900) Pulse Rate: 89 (02/18 1900)  Labs: Recent Labs    05/03/19 0358 05/03/19 1645 05/04/19 0427 05/04/19 1739 05/05/19 0500 05/05/19 0501 05/05/19 0647 05/05/19 0647 05/05/19 0848 05/05/19 0850 05/05/19 1657  HGB 11.3*  --  11.1*   < > 11.2*   < > 13.9   < > 13.9 12.2*  --   HCT 35.2*  --  35.0*   < > 35.2*   < > 41.0  --  41.0 36.0*  --   PLT 53*  --  65*  --  123*  --   --   --   --   --   --   APTT 58*   < > 57*  --  49*  --   --   --   --   --  53*  CREATININE 3.02*   < > 2.72*   < > 3.05*   < > 2.00*   < > 2.00* 3.00* 4.01*   < > = values in this interval not displayed.    Estimated Creatinine Clearance: 34.5 mL/min (A) (by C-G formula based on SCr of 4.01 mg/dL (H)).  Assessment: 34 year old male with left MCA infarct - received TPA and revascularization with IR on 2/5. On 2/8 found to have small LV apical thrombus on ECHO. Pharmacy consulted to dose IV heparin, which was switched to bivalirudin on 2/15 for HIT.  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 SRA pending.    Patient had Perc Trach placement at ~1PM today.  Bivalirudin was held for placement but resumed at 1400 PM per CCM.  APTT is therapeutic at 53 sec on 0.055 mg/kg/hr after restart.  No issues or bleeding per RN.   Goal of Therapy:  APTT 50- 65 sec  Monitor platelets by anticoagulation protocol: Yes   Plan:  Increase bivalirudin to 0.055 mg/kg/hr (AdjBW, goal 50-65sec) Repeat aPTT in 4 hours  (confirmatory).  Daily aPTT and CBC F/u SRA  Will need non-heparin anticoagulation for at least 3 months (= 07/24/19)  Maricela Bo, PharmD, BCPS, BCCCP Clinical Pharmacist Please refer to [facility] for [place] numbers 05/05/2019, 7:45 PM

## 2019-05-05 NOTE — Progress Notes (Addendum)
[place] for Heparin >> Bivalirudin Indication: stroke 2/5, LV thrombus 2/8, HIT 2/15   Allergies  Allergen Reactions  . Heparin Other (See Comments)    Heparin induced thrombocytopenia. 2/15 HIT OD 1.692.     Patient Measurements: Height: 6\' 1"  (185.4 cm) Weight: 254 lb 6.6 oz (115.4 kg) IBW/kg (Calculated) : 79.9   Vital Signs: Temp: 97.7 F (36.5 C) (02/18 0400) Temp Source: Axillary (02/18 0400) BP: 110/91 (02/18 0600) Pulse Rate: 79 (02/18 0300)  Labs: Recent Labs    05/03/19 0358 05/03/19 0358 05/03/19 1645 05/04/19 0427 05/04/19 1739 05/05/19 0500 05/05/19 0500 05/05/19 0501 05/05/19 0501 05/05/19 0507 05/05/19 0647  HGB 11.3*   < >  --  11.1*   < > 11.2*   < > 14.3   < > 12.2* 13.9  HCT 35.2*   < >  --  35.0*   < > 35.2*   < > 42.0  --  36.0* 41.0  PLT 53*  --   --  65*  --  123*  --   --   --   --   --   APTT 58*   < > 58* 57*  --  49*  --   --   --   --   --   CREATININE 3.02*   < > 2.85* 2.72*   < > 3.05*   < > 2.00*  --  3.10* 2.00*   < > = values in this interval not displayed.    Estimated Creatinine Clearance: 69.3 mL/min (A) (by C-G formula based on SCr of 2 mg/dL (H)).  Assessment: 34 yr old male with left MCA infarct - received TPA and revascularization with IR on 2/5. On 2/8 found to have small LV apical thrombus on ECHO. Pharmacy consulted to dose IV heparin, which was switched to bivalirudin 2/15 for HIT.  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 SRA pending.    APTT has been stable, but decreased to slightly sub-therapeutic level this AM.  No issue with infusion.  No bleeding reported.  Goal of Therapy:  APTT 50- 65 sec  Monitor platelets by anticoagulation protocol: Yes   Plan:  Increase bivalirudin to 0.055 mg/kg/hr (AdjBW, goal 50-65sec) Repeat aPTT in 3 hrs Daily aPTT and CBC F/u SRA  Will need non-heparin anticoagulation for at least 3 months (=  07/24/19)  Remigio Eisenmenger D. Mina Marble, PharmD, BCPS, [place] 05/05/2019, 7:43 AM  ==================================  Addendum: Argatroban held this AM for trach placement Now s/p trach and med to resume at 1400 per CCM Will check a follow-up aPTT Monitor for bleeding  Ahri Olson D. Mina Marble, PharmD, BCPS, [place] 05/05/2019, 1:49 PM

## 2019-05-05 NOTE — Procedures (Signed)
Bedside Bronchoscopy Procedure Note Carlos Brewer PO:[id number] April 15, 1985  Procedure: Bronchoscopy Indications: Diagnostic evaluation of the airways  Procedure Details: ET Tube Size: ET Tube secured at lip (cm): Bite block in place: Yes In preparation for procedure, Patient hyper-oxygenated with 100 % FiO2 Airway entered and the following bronchi were examined: RUL, RML, RLL, LUL, LLL and Bronchi.   Bronchoscope removed.    Evaluation BP 120/85   Pulse 86   Temp 98 F (36.7 C) (Axillary)   Resp (!) 24   Ht 6\' 1"  (1.854 m)   Wt 115.4 kg   SpO2 99%   BMI 33.57 kg/m  Breath Sounds:Clear and Diminished O2 sats: stable throughout Patient's Current Condition: stable Specimens:  None Complications: No apparent complications Patient did tolerate procedure well.   [name] 05/05/2019, 2:37 PM

## 2019-05-05 NOTE — Progress Notes (Signed)
NAME:  Carlos Vallone., MRN:  [id number], DOB:  03-22-1985, LOS: 13 ADMISSION DATE:  05/02/2019, CONSULTATION DATE:  05/08/2019 REFERRING MD:  Dr. Leonel Ramsay, CHIEF COMPLAINT:  Slurred speech  Brief History   34 yo male smoker found to have slurred speech and Rt sided weakness.  CT head showed M2 occlusion.  Ultimately treated with thrombolytic and thrombectomy by IR.  Required intubation for airway protection. Course complicated by septic shock, AKI requiring CRRT and polymorphic VT  Past Medical History  Systolic CHF with non ischemic CM, Cocaine abuse, OSA, DM Hx of CHF (EF 15%)  Hx myocarditis Smoker 1/2 ppd   Significant Hospital Events   2/05 Admit, tPA, IR thrombectomy 2/6 100% on fvent at 2300  Overnight requiring increasing levophed and phenylephrine.    2/6: switched to levophed, epi, started antibiotics, started on CRRT.  2/9  developed wide-complex tachycardia and hypotension, started on amiodarone drip, increase Levophed drip 2/15 drop in platelets, heparin stopped, bivalved started  Consults:  Neuro IR  Procedures:  ETT 2/05 >>  [facility] CVL 2/5 >> RIJ HD cath 2/6 >>  2/5-2/6:  TPA given at 428 am (total of 90 Mg) 520 am went to IR  S/P Lt common carotid arteriogram followed by complete revascularization of occluded LT MCA sup division mid M2 seg with x 1 pass with 40mx 40 mm solitaire X ret river device and penumbra aspiration with TICI 3 revascularization   Significant Diagnostic Tests:  CT angio head/neck 2/05 >> occlusion of Lt MCA bifurcation Echo 2/05 >> EF less than 20%, cannot rule out apical thrombus MRI brain 2/11 > extensive acute infarction of multiple areas without large or medium vessel occlusion Echocardiogram 2/8 Left ventricular ejection fraction, by estimation, is <20%. The left  ventricle has severely decreased function. The left ventricle demonstrates  global hypokinesis. The left ventricular internal cavity size was severely    dilated. Possible small 0.8 x  0.6 cm apical thrombus. Somewhat subtle finding, could confirm with  cardiac MRI. However, if patient has had CVA, would be reasonable to  anticoagulate given severity of LV dysfunction if no contraindications to  anticoagulation.   Micro Data:  SARS CoV2 PCR 2/05 >> negative Influenza PCR 2/05 >> negative Urine 2/6 >> ng resp 2/6 >> nml flora BC 2/6 >>ng  Antimicrobials:  mero 2/6 -2/12 Zosyn 2/6 vanc 2/6 >> 2/9  Interim history/subjective:  Acutely ill, intubated Remains on CRRT Hypotensive and back on Levophed Sedated with Precedex   Objective   Blood pressure (!) 122/98, pulse 78, temperature 97.7 F (36.5 C), temperature source Axillary, resp. rate (!) 22, height 6' 1"  (1.854 m), weight 115.4 kg, SpO2 100 %. CVP:  [12 mmHg-20 mmHg] 12 mmHg  Vent Mode: PRVC FiO2 (%):  [40 %] 40 % Set Rate:  [24 bmp] 24 bmp Vt Set:  [630 mL] 630 mL PEEP:  [5 cmH20] 5 cmH20 Plateau Pressure:  [21 cmH20-27 cmH20] 22 cmH20   Intake/Output Summary (Last 24 hours) at 05/05/2019 0926 Last data filed at 05/05/2019 0900 Gross per 24 hour  Intake 3260.16 ml  Output 4447 ml  Net -1186.84 ml   Filed Weights   05/03/19 0500 05/04/19 0500 05/05/19 0500  Weight: 119 kg 120 kg 115.4 kg    Examination:  General -young well-built man, intubated, in no distress Eyes -pupils reactive , mild pallor, no icterus ENT -oral ET tube in place, bite block Cardiac -S1-S2 tacky, no murmur Chest - bilateral ventilated breath sounds, mild  blood-tinged secretions Abdomen - soft, non tender, + bowel sounds Extremities - no cyanosis, clubbing, or edema Skin - no rashes Neuro -more awake, tracks did not follow commands for me, no nystagmus   Chest x-ray 2/16 personally reviewed shows patchy opacity in the left lung and right base, slight increase compared to 2/13  Labs show mild leukocytosis, hypocalcemia, normal electrolytes   Resolved Hospital Problem list      Assessment & Plan:   Septic shock vs cardiogenic shock -resolved 2/15, back on pressors 2/17 Pressor requirements seem to be related to sedation and cardiogenic rather than sepsis  -Co-ox again low 2/17 (off Levophed ) , consider milrinone -Completed 7 days of meropenem for UTI/pneumonia  Cardiogenic shock Left ventricle apical thrombus non ischemic cardiomyopathy with systolic congestive heart failure Polymorphic VT on 2/9 -Cardiology following -Continue amiodarone PO -Add low-dose Coreg if remains off Levophed -Lipitor, digoxin -Medications on hold include Coreg, Zaroxolyn, Entresto, Aldactone, torsemide  Acute hypoxemic respiratory failure -Plan for tracheostomy, platelets improved   Acute kidney injury -Remains anuric, no evidence of renal recovery -Give break on CRRT to enable change of lines   Left MCA CVA Multiple other infarcts noted on MRI 7/53-DFPBHE embolic Acute encephalopathy -Intermittent fentanyl, versed -Back on Precedex , goal RASS 0 to -1   Thrombocytopenia - HIT +, await SRA -Continue Angiomax -hold for procedures today - avoid heparin (listed as allergy now)   DM type II poorly controlled. - SSI -Ct 20 units Levemir daily -Ct 4 units NovoLog every 4 hours TF coverage -CBG 1 40-180 acceptable  Hx of cocaine abuse. depression. - not active per UDS  - continue prozac  Summary-plan for tracheostomy and change of lines today, give a break from CRRT  Best practice:  Diet: NPO,  TFs DVT prophylaxis: SCDs GI prophylaxis: protonix Mobility: bed rest Code Status: full code Disposition: ICU Family -fianc [name] & mom updated  The patient is critically ill with multiple organ systems failure and requires high complexity decision making for assessment and support, frequent evaluation and titration of therapies, application of advanced monitoring technologies and extensive interpretation of multiple databases. Critical Care Time devoted to  patient care services described in this note independent of APP/resident  time is 32 minutes.     [name] MD. Shade Flood. [facility]  If no response to pager , please call [phone]   05/05/2019

## 2019-05-05 NOTE — Procedures (Signed)
Procedure: Central Line Placement Indication: hypotension, suspected sepsis  Procedure: After obtaining consent and performing a time out, the area over the right subclavian was prepped with chlorhexidine and widely draped. Sterile garb was donned and the vein easily cannulated. A wire was gently passed, the skin sharply incised and the tract dilated. A 20 cm 7FR triple lumen catheter was placed over the wire . There was good flow from all ports. The catheter was sutured in place at 17 cm. A sterile dressing was applied.   CXR shows good placement and no ptx.

## 2019-05-05 NOTE — Progress Notes (Signed)
Nutrition Follow-up  DOCUMENTATION CODES:   Obesity unspecified  INTERVENTION:   Once Cortrak placed start Vital 1.2 @ 50 ml/hr (1200 ml/day)  60 ml Prostat TID  Provides: 2040 kcal, 180 grams protein, and 973 ml free water.   NUTRITION DIAGNOSIS:   Inadequate oral intake related to inability to eat as evidenced by NPO status. Ongoing.   GOAL:   Patient will meet greater than or equal to 90% of their needs Meeting with TF.   MONITOR:   Vent status  REASON FOR ASSESSMENT:   Ventilator    ASSESSMENT:   Pt with PMH of CHF, OSA, poorly controlled DM, cocaine abuse admitted with L MCA stroke s/p tPA and IR thrombectomy.   Pt with cardiogenic vs septic shock and AKI. Per Nephrology pt will continue CRRT as he has no evidence of renal recovery.   2/6 started on CRRT  2/18 s/p trach placement, CRRT stopped for line replacement scheduled for tomorrow and will then resume CRRT. Plan for cortrak placement tomorrow as well.   Patient is currently intubated on ventilator support MV: 12.9 L/min Temp (24hrs), Avg:98.4 F (36.9 C), Min:97.7 F (36.5 C), Max:99.9 F (37.7 C)  Medications reviewed and include:  4 units novolog every 4 hours, 20 units levemir daily,  Levo @ 11 mcg Labs reviewed:  CBGs: 163-144   I&O: -1129 ml last 24 hrs  CRRT: 4188 ml   TF: Vital High Protein @ 50 ml/hr and 60 ml Prostat TID Provides: 1800 kcal, 195 grams protein  Diet Order:   Diet Order            Diet NPO time specified  Diet effective now              EDUCATION NEEDS:   No education needs have been identified at this time  Skin:  Skin Assessment: Skin Integrity Issues: Skin Integrity Issues:: Stage II, Other (Comment) Stage II: R buttocks Other: open wound buttocks  Last BM:  100 ml via rectal tube  Height:   Ht Readings from Last 1 Encounters:  04/21/2019 6\' 1"  (1.854 m)    Weight:   Wt Readings from Last 1 Encounters:  05/05/19 115.4 kg    Ideal Body  Weight:  83.6 kg  BMI:  Body mass index is 33.57 kg/m.  Estimated Nutritional Needs:   Kcal:  2000-2300  Protein:  167 -209 grams  Fluid:  2 L/day  Lockie Pares., RD, LDN, CNSC See AMiON for contact information

## 2019-05-05 NOTE — Progress Notes (Signed)
STROKE TEAM PROGRESS NOTE   INTERVAL HISTORY Patient neurological condition has not significantly changed.  He is a little bit more physically active and moves his neck side-to-side in his lower extremities sometimes but not to commands.  He does have some horizontal roving eye movements but again will not track or follow commands consistently.  Continues to be critically ill with renal failure requiring CRRT for dialysis as well as ventilatory support for respiratory failure.  He remains hypotensive and is vasopressor dependent.  CCM team plans tracheostomy soon   OBJECTIVE Vitals:   05/05/19 0600 05/05/19 0700 05/05/19 0800 05/05/19 0806  BP: (!) 110/91 112/69 (!) 88/77 108/74  Pulse:    78  Resp: (!) 24 (!) 24 (!) 24 (!) 26  Temp:      TempSrc:      SpO2: 100%   100%  Weight:      Height:       CBC:  Recent Labs  Lab 05/04/19 0427 05/04/19 1808 05/05/19 0500 05/05/19 0501 05/05/19 0507 05/05/19 0647  WBC 9.9  --  16.3*  --   --   --   HGB 11.1*   < > 11.2*   < > 12.2* 13.9  HCT 35.0*   < > 35.2*   < > 36.0* 41.0  MCV 95.6  --  96.7  --   --   --   PLT 65*  --  123*  --   --   --    < > = values in this interval not displayed.   Basic Metabolic Panel:  Recent Labs  Lab 05/04/19 0427 05/04/19 0427 05/04/19 1739 05/04/19 1808 05/05/19 0500 05/05/19 0501 05/05/19 0507 05/05/19 0647  NA 134*   < > 134*   < > 134*   < > 136 138  K 4.6   < > 4.7   < > 4.6   < > 4.6 4.2  CL 102   < > 101   < > 98   < > 101 98  CO2 20*   < > 20*  --  19*  --   --   --   GLUCOSE 138*   < > 155*   < > 209*   < > 211* 223*  BUN 59*   < > 71*   < > 63*   < > 57* 44*  CREATININE 2.72*   < > 3.43*   < > 3.05*   < > 3.10* 2.00*  CALCIUM 8.7*   < > 8.6*  --  7.8*  --   --   --   MG 2.7*  --   --   --  2.3  --   --   --   PHOS 4.0   < > 4.5  --  4.4  --   --   --    < > = values in this interval not displayed.    IMAGING CT HEAD CODE STROKE WO CONTRAST 05/02/2019 1. Asymmetric hyperdensity  of a Left MCA branch in the Sylvian fissure suspicious for emergent large vessel occlusion in the setting.  2. No associated acute cortically based infarct, ASPECTS 10. No acute hemorrhage.  3. Chronic right frontal operculum encephalomalacia.   CT Code Stroke CTA Head W/WO contrast CT Code Stroke CTA Neck W/WO contrast 05/08/2019 1. Positive for emergent large vessel occlusion at the Left MCA bifurcation. There is reconstitution of the dominant posterior division branch.  2. Elsewhere negative CTA head and  neck. Dominant right vertebral artery which supplies the basilar. Fetal type PCA origins.   CT CEREBRAL PERFUSION W CONTRAST 05/02/2019 1. CTP detects evidence of Left MCA territory oligemia concordant with the CTA findings.  2. No infarct core detected with standard CBF <30%.  3. 60 mL of penumbra detected with standard T-max > 6s.   CT HEAD WO CONTRAST 04/23/2019 Small region of loss of gray-white differentiation in the left frontal operculum consistent with a fairly small area of acute infarction compared to the previous perfusion abnormality. No hemorrhage or mass effect. Areas of low-density in the cerebellum that I favor are artifactual. There did not seem to be any abnormalities in that region on the previous examinations.   MRI / MRA Head WO Contrast  04/28/2019 Extensive acute infarction in the right cerebellum with swelling and petechial blood products. Acute infarction of the superior cerebellar peduncle on the right, with some infarction in the mid brain. Extensive acute infarction in the right temporal lobe and insula. Small acute infarctions in the frontal operculum region and right parietal region. Infarction affecting the right posterior basal ganglia and caudate nucleus. Petechial blood products present without frank hematoma. Acute infarction in the left insula and frontal operculum. Petechial blood products present without frank hematoma. Small punctate acute infarctions higher  in the left frontoparietal junction region. No large or medium vessel occlusion seen presently. Restoration of flow in the left MCA. Flow appears to be present in posterior circulation main vessels and branch vessels presently.   DG CHEST PORT 1 VIEW 05/03/2019 1. Coarsening patchy opacity in the left lung and right lung base, asymmetric edema versus infection. 2. Stable cardiomegaly. 3. Stable support apparatus. 04/30/2019 Improvement of bilateral airspace disease. 04/29/2019 Patchy opacities consistent with the given clinical history. Tubes and lines as described.  04/27/2019 Perhaps slightly improved aeration at the left lung base. Otherwise no change in the bilateral patchy airspace and interstitial changes, likely associated with small effusions. Lines and tubes unchanged. 04/26/2019 0854 1. Lines and tubes as described above. 2. Cardiomegaly with basilar airspace disease, likely associated with effusions. 3. Interstitial and alveolar opacities elsewhere may represent a combination of infection and edema and are little changed. 04/25/2019 0946 Overall improved aeration from the prior exam although persistent opacities and small effusions are noted. 04/23/19 1636 1. Interval placement of a right neck vascular sheath, tip positioned over the right brachiocephalic vein. 2.  Other support apparatus in unchanged position. 3. Slight interval increase in diffuse bilateral interstitial and heterogeneous airspace opacity, consistent with multifocal infection, edema, and/or ARDS. 4. Cardiomegaly. 04/23/2019 0757 Increasing patchy bilateral airspace opacities which may represent edema or infection. 05/03/2019 1958 1. Interval placement of neck vascular catheter, tip positioned near the superior cavoatrial junction.  2. Interval retraction of endotracheal tube, now positioned just above the thoracic inlet, approximately 6.5 cm above the carina.  3. Interval placement of esophagogastric tube, tip and side port below  the diaphragm. 4. Stable cardiomegaly and diffuse bilateral interstitial airspace opacity, likely edema.  04/28/2019 0841 1. Endotracheal tube tip noted just above the carina. Proximal repositioning of 2-3 should be considered. 2. Cardiomegaly again noted. Diffuse bilateral pulmonary infiltrates/edema. Small left pleural effusion.   ECHOCARDIOGRAM COMPLETE 05/11/2019 1. Left ventricular ejection fraction, by visual estimation, is <20%. The left ventricle has severely decreased function. There is no left ventricular hypertrophy.   2. Apex is not well-visualized. In setting of stroke with severe LV systolic dysfunction, recommend repeat TTE with contrast to rule out LV  apical thrombus   3. Severely dilated left ventricular internal cavity size.   4. Left ventricular diastolic parameters are consistent with Grade III diastolic dysfunction (restrictive).   5. Elevated left atrial pressure.   6. The left ventricle demonstrates global hypokinesis.   7. Global right ventricle has normal systolic function.The right ventricular size is mildly enlarged.   8. Left atrial size was moderately dilated.   9. Right atrial size was normal.  10. The mitral valve is normal in structure. Mild mitral valve regurgitation.  11. The tricuspid valve is normal in structure. Tricuspid valve regurgitation is trivial.  12. The aortic valve is normal in structure. Aortic valve regurgitation is not visualized. No evidence of aortic valve sclerosis or stenosis.  13. The pulmonic valve was not well visualized. Pulmonic valve regurgitation is not visualized.   Interventional Neuro Radiology - Cerebral Angiogram with Intervention 05/07/2019 S/P Lt common carotid arteriogram followed by complete revascularization of occluded LT MCA sup division mid M2 seg with x 1 pass with 25mx 40 mm solitaire X ret river device and penumbra aspiration with TICI 3 revascularization  ECG - SR rate 93 BPM. (See cardiology reading for complete  details)  PHYSICAL EXAM     Temp:  [97.7 F (36.5 C)-99.9 F (37.7 C)] 97.7 F (36.5 C) (02/18 0400) Pulse Rate:  [64-105] 78 (02/18 0806) Resp:  [20-26] 26 (02/18 0806) BP: (87-126)/(62-108) 108/74 (02/18 0806) SpO2:  [100 %] 100 % (02/18 0806) Arterial Line BP: (76-127)/(49-85) 105/72 (02/18 0800) FiO2 (%):  [40 %] 40 % (02/18 0806) Weight:  [115.4 kg] 115.4 kg (02/18 0500)  General - obese young african [place], intubated off precedex.  Ophthalmologic - fundi not visualized due to noncooperation.  Cardiovascular - Regular rhythm and rate.  Neuro - intubated    Eyes are closed but able to open with repetitive stimulation.  He does intermittently spontaneously move his neck from side to side.  He does not follow, commands or gaze but has intermittent tracking to side of voice and blinking to visual threat but not consistent. PERRL. Corneal reflex present, gag and cough present. Breathing over the vent. Facial symmetry not able to test due to ET tube.  Tongue protrusion not cooperative. On pain stimulation, no movement of both upper extremities but has trace withdrawal in both lower extremities..Marland KitchenDTR 1+ and bilateral positive babinski.  He does not grimace to painful stimuli.   ASSESSMENT/PLAN Mr. LAyan Heffington is a 34y.o. male with history of NICM EF 15-20%, CHF, DM, Obesity, tobacco use, OSA, and hx of cocaine use presenting with aphasia and right sided weakness. The patient received IV t-PA on Friday 04/25/2019 at 4:45 AM. Mechanical thrombectomy left M2.  Stroke:  Extensive R cerebellar, midbrain, R MCA and L MCA infarcts s/p tPA & IR with left M2 TICI3 reperfusion - embolic - likely secondary to severe cardiomyopathy with low EF and LV thrombus  Code Stroke CT Head - left MCA hyperdensity.   CTA H&N - emergent large vessel occlusion at the Left MCA bifurcation.   CT Perfusion - 60 mL of penumbra detected.   CT head repeat - Small region of loss of gray-white  differentiation in the left frontal operculum   MRI head - 04/28/2019 - extensive R cerebellar infarct w/ petechial hemorrhage. R cerebellar peduncle and midbrain infarct. Extensive R temporal lobe and insula infarct w/ R frontal operculum and parietal infarcts. R posterior basal ganglia and caudate head infarct w/ petechial hemorrhage. L  insula and L frontal operculum infarcts w/ petechial hemorrhage. Small L frontoparietal jxn infarcts.   MRA head restoration L MCA flow. Flow in posterior circulation.   2D Echo - EF < 20%   2D with contrast - possible LV apical thrombus 0.8 x 0.6  Sars Corona Virus 2 - negative  LDL - 203  HgbA1c - 6.5  VTE prophylaxis - angiomax IV  aspirin 81 mg daily prior to admission, IV heparin on hold d/t thrombocytopenia, now on angiomax. HIT elevated. SRA pending. As PLTS increasing, added low dose aspirin 81 to Angiomax.     Therapy recommendations:  pending - order when able to participate   Disposition:  Pending  Change neuro checks to q2h  Encephalopathy  Not following commands or moving extremities  MRI extensive R and L brain and R cerebellar infarcts  Prognosis is poor, concerning for vegetative state vs minimally conscious state  Fiance present on camera during rounds again today. Clarified that mother is next of kin. Fiance designated by pt on arrival to receive information. Fiance agreeable to clarification. Ongoing GOC discussion  ? HIT  On heparin IV for LV thrombus  Platelet [id number]  Heparin discontinued  HIT panel sent - HIT ab elevated. SRA pending    On Angiomax IV  CCM on board  Platelet monitoring  Acute Hypoxemic Respiratory failure  Intubated  Off sedation now   CCM on board  CXR - 05/03/19 - coarsening patchy opacity L and R lung bases edema vs infection  Prn precedex as BP will allow  For trach   Starting spontaneous breathing trails  Cardiogenic vs. Septic shock  Fever, TMax  afebrile  Leukocytosis, WBCs 9.4  CXR - 04/23/19 - consistent with multifocal infection, edema, and/or ARDS (unchanged) CXR - 04/30/19 - Improvement of bilateral airspace disease.  On Meropenum 04/23/19>>04/29/19  On Vancomycin 04/23/19>> 04/26/19  On hydrocortisone->off  U/A neg, Urine culture no growth   Blood cultures 2/6 neg   resp Cx 2/16 reincubated  Off pressors    CCM plans low dose coreg, dig if remains off pressors   LV thrombus Severe nonischemic cardiomyopathy Chronic systolic CHF  2D Echo - EF < 20%   2D with contrast - possible LV apical thrombus 0.8 x 0.6  On heparin IV -> angiomax IV    On amiodarone gtt for wide-complex tachycardia and hypotension -> changed to po  Add beta blocker once BP allows  Cardiology on board  Polymorphic V-tach / SVT [place]  Elevated troponin Prolonged QT  On amiodarone drip -> now changed to po  Troponin trending down 16274->14226->2409   Cardiology on board  May consider cardioversion if needed  AKI  Creatinine - 2.72  BUN 59  Renal consult 04/23/19  Still minimal urinary output, no signs of renal recovery  On CRRT, pharmacist contacted about removing heparin from soln  Plan transition to intermittent dialysis  Hypotension w/ shock Hx Hypertension  Home BP meds: Coreg, Entresto  On pressors - Neo  SBP goal  >110  Stable  Shock liver d/t hypoperfusion, improving  GI on board  AST/ALT - >10000/10132 ->... ->57/192 (05/04/19)    INR 3.1->1.8->1.2  CK 602  Hyperlipidemia  Home Lipid lowering medication: Lipitor 40 mg daily  LDL 203, goal < 70  Ok to add back: Lipitor 40 mg daily   Will not use high intensity statin at this time given hx shock liver  Continue statin at discharge  Diabetes type II, uncontrolled  Home diabetic meds:  insulin  Current diabetic meds: SS insulin, Levemir 20, novolog 4u q6h  HgbA1c 6.5, goal < 7.0  CBG monitoring   SSI  Dysphagia . Secondary to  stroke . NPO . On tube feeding @ 50 . Speech on board  Tobacco abuse  Current smoker  Mom confirmed tobacco use  Smoking cessation counseling will be provided  Other Stroke Risk Factors  ETOH use, advised to drink no more than 1 alcoholic beverage per day.  Hx cocaine abuse, UDS neg   Obesity, Body mass index is 33.57 kg/m., recommend weight loss, diet and exercise as appropriate   Obstructive sleep apnea - uses Cpap  Substance Abuse Hx of cocaine use  Other Active Problems  Code status - Full code  Hyperkalemia - 4.0 resolved  Metabolic acidosis, resolved  Depression on prozac  Hypermagnesemia - 2.7-2.7  Hyperphosphatemia - 4.8/5.0->5.0  Hospital day # 13  Neurological exam remains poor with patient only partially arousable but not following any commands with intermittent roving eye movements and some spontaneous neck and leg movements but not tracking consistently. Patient remains critically ill with respiratory failure requiring ventilatory support as well as CRRT for renal failure.  Platelet counts also appear to be low likely due to heparin-induced thrombocytopenia but appear to be improving   Discussed with Dr. Elsworth Soho.,  And Dr. Marval Regal This patient is critically ill and at significant risk of neurological worsening, death and care requires constant monitoring of vital signs, hemodynamics,respiratory and cardiac monitoring, extensive review of multiple databases, frequent neurological assessment, discussion with family, other specialists and medical decision making of high complexity.I have made any additions or clarifications directly to the above note.This critical care time does not reflect procedure time, or teaching time or supervisory time of PA/NP/Med Resident etc but could involve care discussion time.  I spent 30 minutes of neurocritical care time  in the care of  this patient.   Antony Contras, MD  To contact Stroke Continuity provider, please refer  to [email or web address] After hours, contact General Neurology

## 2019-05-05 NOTE — Progress Notes (Signed)
Patient ID: Carlos Dommer., male   DOB: 26-Jan-1986, 34 y.o.   MRN: [id number] S: Started on citrate yesterday afternoon due to clotting of system.  Also restarted on levophed after dropping bp with sedation O:BP (!) 122/98   Pulse 78   Temp 97.7 F (36.5 C) (Axillary)   Resp (!) 22   Ht _0  (1.854 m)   Wt 115.4 kg   SpO2 100%   BMI 33.57 kg/m   Intake/Output Summary (Last 24 hours) at 05/05/2019 0936 Last data filed at 05/05/2019 0900 Gross per 24 hour  Intake 3260.16 ml  Output 4447 ml  Net -1186.84 ml   Intake/Output: I/O last 3 completed shifts: In: 4266.6 [I.V.:2216.6; NG/GT:2050] Out: 6047 [OLMBE:6754; Stool:100]  Intake/Output this shift:  Total I/O In: 201.7 [I.V.:201.7] Out: 425 [Other:425] Weight change: -4.6 kg Gen: intubated and sedated CVS: no rbu Resp: occ rhonchi Abd: +BS, soft, NT/ND Ext: no edema  Recent Labs  Lab 04/29/19 0512 04/29/19 1600 04/30/19 0437 04/30/19 1612 05/02/19 0330 05/02/19 0330 05/02/19 1655 05/02/19 1655 05/03/19 0358 05/03/19 0358 05/03/19 1645 05/03/19 1645 05/04/19 0427 05/04/19 0427 05/04/19 0453 05/04/19 1739 05/04/19 1808 05/05/19 0310 05/05/19 0500 05/05/19 0501 05/05/19 0507 05/05/19 0646 05/05/19 0647 05/05/19 0850  NA 136   < > 135   < > 134*   < > 134*   < > 135   < > 134*   < > 134*   < >  --  134*   < > 136 134* 139 136 136 138 136  K 4.0   < > 4.0   < > 4.1   < > 3.9   < > 4.0   < > 4.2   < > 4.6   < >  --  4.7   < > 4.3 4.6 4.3 4.6 4.7 4.2 4.8  CL 99   < > 100   < > 101   < > 100   < > 102   < > 102   < > 102   < >  --  101   < > 100 98 97* 101 100 98 102  CO2 23   < > 21*   < > 20*  --  21*  --  22  --  22  --  20*  --   --  20*  --   --  19*  --   --   --   --   --   GLUCOSE 222*   < > 174*   < > 207*   < > 196*   < > 175*   < > 182*   < > 138*   < >  --  155*   < > 222* 209* 220* 211* 215* 223* 181*  BUN 63*   < > 61*   < > 62*   < > 61*   < > 62*   < > 56*   < > 59*   < >  --  71*   < >  60* 63* 47* 57* 56* 44* 53*  CREATININE 3.23*   < > 3.02*   < > 3.11*   < > 2.79*   < > 3.02*   < > 2.85*   < > 2.72*   < >  --  3.43*   < > 3.20* 3.05* 2.00* 3.10* 3.00* 2.00* 3.00*  ALBUMIN 2.9*  2.8*   < > 2.8*  2.8*   < >  2.6*   < > 2.5*  --  2.4*  --  2.5*  --  2.5*  --  2.5* 2.4*  --   --  2.6*  --   --   --   --   --   CALCIUM 8.8*   < > 8.9   < > 8.7*  --  8.5*  --  8.8*  --  8.6*  --  8.7*  --   --  8.6*  --   --  7.8*  --   --   --   --   --   PHOS 4.3   < > 4.8*  5.0*   < > 4.8*  --  5.2*  --  4.2  --  4.6  --  4.0  --   --  4.5  --   --  4.4  --   --   --   --   --   AST 216*  --  129*  --   --   --   --   --   --   --   --   --   --   --  57*  --   --   --   --   --   --   --   --   --   ALT 1,338*  --  897*  --   --   --   --   --   --   --   --   --   --   --  192*  --   --   --   --   --   --   --   --   --    < > = values in this interval not displayed.   Liver Function Tests: Recent Labs  Lab 04/29/19 0512 04/29/19 1600 04/30/19 0437 04/30/19 1612 05/04/19 0453 05/04/19 1739 05/05/19 0500  AST 216*  --  129*  --  57*  --   --   ALT 1,338*  --  897*  --  192*  --   --   ALKPHOS 160*  --  152*  --  117  --   --   BILITOT 1.8*  --  1.8*  --  1.6*  --   --   PROT 6.9  --  7.0  --  7.4  --   --   ALBUMIN 2.9*  2.8*   < > 2.8*  2.8*   < > 2.5* 2.4* 2.6*   < > = values in this interval not displayed.   No results for input(s): LIPASE, AMYLASE in the last 168 hours. No results for input(s): AMMONIA in the last 168 hours. CBC: Recent Labs  Lab 05/01/19 0437 05/01/19 0437 05/02/19 0330 05/02/19 0330 05/03/19 0358 05/03/19 0358 05/04/19 0427 05/04/19 1808 05/05/19 0500 05/05/19 0501 05/05/19 0646 05/05/19 0647 05/05/19 0850  WBC 14.2*   < > 11.8*   < > 9.4  --  9.9  --  16.3*  --   --   --   --   HGB 13.0   < > 12.4*   < > 11.3*   < > 11.1*   < > 11.2*   < > 11.6* 13.9 12.2*  HCT 40.5   < > 37.9*   < > 35.2*   < > 35.0*   < > 35.2*   < > 34.0* 41.0 36.0*   MCV 97.6  --  95.2  --  96.2  --  95.6  --  96.7  --   --   --   --   PLT 92*   < > 49*   < > 53*  --  65*  --  123*  --   --   --   --    < > = values in this interval not displayed.   Cardiac Enzymes: No results for input(s): CKTOTAL, CKMB, CKMBINDEX, TROPONINI in the last 168 hours. CBG: Recent Labs  Lab 05/04/19 1935 05/04/19 2108 05/04/19 2332 05/05/19 0333 05/05/19 0703  GLUCAP 253* 281* 220* 184* 163*    Iron Studies: No results for input(s): IRON, TIBC, TRANSFERRIN, FERRITIN in the last 72 hours. Studies/Results: No results found. . sodium chloride   Intravenous Once  . amiodarone  200 mg Per Tube BID  . aspirin  81 mg Per Tube Daily  . atorvastatin  40 mg Per Tube q1800  . chlorhexidine gluconate (MEDLINE KIT)  15 mL Mouth Rinse BID  . Chlorhexidine Gluconate Cloth  6 each Topical Q0600  . feeding supplement (PRO-STAT SUGAR FREE 64)  60 mL Per Tube TID  . fentaNYL (SUBLIMAZE) injection  50 mcg Intravenous Once  . insulin aspart  0-20 Units Subcutaneous Q4H  . insulin aspart  4 Units Subcutaneous Q4H  . insulin detemir  20 Units Subcutaneous Daily  . mouth rinse  15 mL Mouth Rinse 10 times per day  . pantoprazole sodium  40 mg Per Tube Q1200  . sodium chloride flush  10-40 mL Intracatheter Q12H  . sodium chloride flush  3 mL Intravenous Q12H    BMET    Component Value Date/Time   NA 136 05/05/2019 0850   K 4.8 05/05/2019 0850   CL 102 05/05/2019 0850   CO2 19 (L) 05/05/2019 0500   GLUCOSE 181 (H) 05/05/2019 0850   BUN 53 (H) 05/05/2019 0850   CREATININE 3.00 (H) 05/05/2019 0850   CALCIUM 7.8 (L) 05/05/2019 0500   GFRNONAA 25 (L) 05/05/2019 0500   GFRAA 29 (L) 05/05/2019 0500   CBC    Component Value Date/Time   WBC 16.3 (H) 05/05/2019 0500   RBC 3.64 (L) 05/05/2019 0500   HGB 12.2 (L) 05/05/2019 0850   HCT 36.0 (L) 05/05/2019 0850   PLT 123 (L) 05/05/2019 0500   MCV 96.7 05/05/2019 0500   MCH 30.8 05/05/2019 0500   MCHC 31.8 05/05/2019 0500    RDW 14.5 05/05/2019 0500   LYMPHSABS 0.8 04/24/2019 0843   MONOABS 0.5 04/24/2019 0843   EOSABS 0.4 04/24/2019 0843   BASOSABS 0.0 04/24/2019 0843    Assessment/Plan:  1. AKIpresumably due to ATN with ischemic/nephrotoxic insults worsened by cardiogenic shock. Started on CRRT 04/23/19. Remains oliguric and dialysis dependentbut was able to come off of pressors last night. 1. Due to need to replace HD catheter will discontinue CVVHD today and resume tomorrow after HD catheter is replaced. 2. Cardiogenic shock-currently back on levophed but likely due to sedation. 3. Acute stroke- presumed embolic with bilateral infarcts and LV thrombus- received tpa on admission 4. Vascular access- will need a new HD catheter placement as the current catheter has been in place since 04/23/19.   1. Will stop CVVHD today and removed RIJ HD cathter and replace HD catheter sometime tomorrow.  Plan discussed with Dr. Elsworth Soho 5. SVT/polymorphic VT- on amiodarone per Cardiology 6. Acute hypoxic respiratory failure- on vent and for trach soon if he remains off of pressors and  platelets improve per PCCM 7. Thrombocytopenia- heparin discontinued. HIT panel ordered.  Platelets improving.  Donetta Potts, MD [employer] [phone]

## 2019-05-05 NOTE — Progress Notes (Signed)
Assisted tele visit to patient with family member.  Coty Student R, RN  

## 2019-05-05 NOTE — Procedures (Signed)
Bronchoscopy  Indication: Perc Trach Placement  Consent: Signed in chart  Anesthesia: Etomidate, Rocuronium, Versed, Fentanyl  Procedure - Timeout performed - Bronchoscope advanced through ETT - Airways examined down to subsegmental level - Following airway examination, direct visualization of percutaneous tracheostomy placement was performed  Findings - Mild thick secretions suctioned - Appropriate positioning of tracheostomy tube  Specimen(s): None  Complications: None immediate  Erskine Emery MD

## 2019-05-06 DIAGNOSIS — R570 Cardiogenic shock: Secondary | ICD-10-CM | POA: Diagnosis not present

## 2019-05-06 DIAGNOSIS — J9600 Acute respiratory failure, unspecified whether with hypoxia or hypercapnia: Secondary | ICD-10-CM | POA: Diagnosis not present

## 2019-05-06 DIAGNOSIS — I5023 Acute on chronic systolic (congestive) heart failure: Secondary | ICD-10-CM | POA: Diagnosis not present

## 2019-05-06 DIAGNOSIS — N170 Acute kidney failure with tubular necrosis: Secondary | ICD-10-CM

## 2019-05-06 DIAGNOSIS — Z930 Tracheostomy status: Secondary | ICD-10-CM

## 2019-05-06 DIAGNOSIS — J9601 Acute respiratory failure with hypoxia: Secondary | ICD-10-CM | POA: Diagnosis not present

## 2019-05-06 DIAGNOSIS — I6602 Occlusion and stenosis of left middle cerebral artery: Secondary | ICD-10-CM | POA: Diagnosis not present

## 2019-05-06 LAB — BASIC METABOLIC PANEL
Anion gap: 16 — ABNORMAL HIGH (ref 5–15)
BUN: 119 mg/dL — ABNORMAL HIGH (ref 6–20)
CO2: 19 mmol/L — ABNORMAL LOW (ref 22–32)
Calcium: 9.3 mg/dL (ref 8.9–10.3)
Chloride: 99 mmol/L (ref 98–111)
Creatinine, Ser: 6.48 mg/dL — ABNORMAL HIGH (ref 0.61–1.24)
GFR calc Af Amer: 12 mL/min — ABNORMAL LOW (ref >60)
GFR calc non Af Amer: 10 mL/min — ABNORMAL LOW (ref >60)
Glucose, Bld: 257 mg/dL — ABNORMAL HIGH (ref 70–99)
Potassium: 6.4 mmol/L (ref 3.5–5.1)
Sodium: 134 mmol/L — ABNORMAL LOW (ref 135–145)

## 2019-05-06 LAB — POCT I-STAT, CHEM 8
BUN: 121 mg/dL — ABNORMAL HIGH (ref 6–20)
BUN: 101 mg/dL — ABNORMAL HIGH (ref 6–20)
BUN: 109 mg/dL — ABNORMAL HIGH (ref 6–20)
Calcium, Ion: 1.07 mmol/L — ABNORMAL LOW (ref 1.15–1.40)
Calcium, Ion: 0.35 mmol/L — CL (ref 1.15–1.40)
Calcium, Ion: 0.98 mmol/L — ABNORMAL LOW (ref 1.15–1.40)
Chloride: 99 mmol/L (ref 98–111)
Chloride: 96 mmol/L — ABNORMAL LOW (ref 98–111)
Chloride: 98 mmol/L (ref 98–111)
Creatinine, Ser: 6.3 mg/dL — ABNORMAL HIGH (ref 0.61–1.24)
Creatinine, Ser: 4.6 mg/dL — ABNORMAL HIGH (ref 0.61–1.24)
Creatinine, Ser: 5.9 mg/dL — ABNORMAL HIGH (ref 0.61–1.24)
Glucose, Bld: 222 mg/dL — ABNORMAL HIGH (ref 70–99)
Glucose, Bld: 260 mg/dL — ABNORMAL HIGH (ref 70–99)
Glucose, Bld: 259 mg/dL — ABNORMAL HIGH (ref 70–99)
HCT: 26 % — ABNORMAL LOW (ref 39.0–52.0)
HCT: 30 % — ABNORMAL LOW (ref 39.0–52.0)
HCT: 28 % — ABNORMAL LOW (ref 39.0–52.0)
Hemoglobin: 8.8 g/dL — ABNORMAL LOW (ref 13.0–17.0)
Hemoglobin: 10.2 g/dL — ABNORMAL LOW (ref 13.0–17.0)
Hemoglobin: 9.5 g/dL — ABNORMAL LOW (ref 13.0–17.0)
Potassium: 4.6 mmol/L (ref 3.5–5.1)
Potassium: 4.2 mmol/L (ref 3.5–5.1)
Potassium: 4.2 mmol/L (ref 3.5–5.1)
Sodium: 135 mmol/L (ref 135–145)
Sodium: 138 mmol/L (ref 135–145)
Sodium: 136 mmol/L (ref 135–145)
TCO2: 22 mmol/L (ref 22–32)
TCO2: 23 mmol/L (ref 22–32)
TCO2: 22 mmol/L (ref 22–32)

## 2019-05-06 LAB — RENAL FUNCTION PANEL
Albumin: 2.3 g/dL — ABNORMAL LOW (ref 3.5–5.0)
Albumin: 2 g/dL — ABNORMAL LOW (ref 3.5–5.0)
Anion gap: 17 — ABNORMAL HIGH (ref 5–15)
Anion gap: 16 — ABNORMAL HIGH (ref 5–15)
BUN: 110 mg/dL — ABNORMAL HIGH (ref 6–20)
BUN: 115 mg/dL — ABNORMAL HIGH (ref 6–20)
CO2: 20 mmol/L — ABNORMAL LOW (ref 22–32)
CO2: 19 mmol/L — ABNORMAL LOW (ref 22–32)
Calcium: 9.2 mg/dL (ref 8.9–10.3)
Calcium: 8.3 mg/dL — ABNORMAL LOW (ref 8.9–10.3)
Chloride: 97 mmol/L — ABNORMAL LOW (ref 98–111)
Chloride: 100 mmol/L (ref 98–111)
Creatinine, Ser: 6.23 mg/dL — ABNORMAL HIGH (ref 0.61–1.24)
Creatinine, Ser: 6.07 mg/dL — ABNORMAL HIGH (ref 0.61–1.24)
GFR calc Af Amer: 12 mL/min — ABNORMAL LOW (ref >60)
GFR calc Af Amer: 13 mL/min — ABNORMAL LOW (ref >60)
GFR calc non Af Amer: 11 mL/min — ABNORMAL LOW (ref >60)
GFR calc non Af Amer: 11 mL/min — ABNORMAL LOW (ref >60)
Glucose, Bld: 171 mg/dL — ABNORMAL HIGH (ref 70–99)
Glucose, Bld: 225 mg/dL — ABNORMAL HIGH (ref 70–99)
Phosphorus: 7.2 mg/dL — ABNORMAL HIGH (ref 2.5–4.6)
Phosphorus: 8.1 mg/dL — ABNORMAL HIGH (ref 2.5–4.6)
Potassium: 7.3 mmol/L (ref 3.5–5.1)
Potassium: 4.6 mmol/L (ref 3.5–5.1)
Sodium: 134 mmol/L — ABNORMAL LOW (ref 135–145)
Sodium: 135 mmol/L (ref 135–145)

## 2019-05-06 LAB — COOXEMETRY PANEL
Carboxyhemoglobin: 1.6 % — ABNORMAL HIGH (ref 0.5–1.5)
Carboxyhemoglobin: 2.6 % — ABNORMAL HIGH (ref 0.5–1.5)
Methemoglobin: 0.7 % (ref 0.0–1.5)
Methemoglobin: 1 % (ref 0.0–1.5)
O2 Saturation: 58.4 %
O2 Saturation: 99.1 %
Total hemoglobin: 10 g/dL — ABNORMAL LOW (ref 12.0–16.0)
Total hemoglobin: 9 g/dL — ABNORMAL LOW (ref 12.0–16.0)

## 2019-05-06 LAB — MAGNESIUM: Magnesium: 2.3 mg/dL (ref 1.7–2.4)

## 2019-05-06 LAB — CBC
HCT: 31.1 % — ABNORMAL LOW (ref 39.0–52.0)
Hemoglobin: 9.7 g/dL — ABNORMAL LOW (ref 13.0–17.0)
MCH: 30.3 pg (ref 26.0–34.0)
MCHC: 31.2 g/dL (ref 30.0–36.0)
MCV: 97.2 fL (ref 80.0–100.0)
Platelets: 165 10*3/uL (ref 150–400)
RBC: 3.2 MIL/uL — ABNORMAL LOW (ref 4.22–5.81)
RDW: 14.4 % (ref 11.5–15.5)
WBC: 15.6 10*3/uL — ABNORMAL HIGH (ref 4.0–10.5)
nRBC: 0.1 % (ref 0.0–0.2)

## 2019-05-06 LAB — APTT
aPTT: 62 seconds — ABNORMAL HIGH (ref 24–36)
aPTT: 53 seconds — ABNORMAL HIGH (ref 24–36)

## 2019-05-06 LAB — POTASSIUM: Potassium: 5.7 mmol/L — ABNORMAL HIGH (ref 3.5–5.1)

## 2019-05-06 LAB — GLUCOSE, CAPILLARY
Glucose-Capillary: 148 mg/dL — ABNORMAL HIGH (ref 70–99)
Glucose-Capillary: 227 mg/dL — ABNORMAL HIGH (ref 70–99)
Glucose-Capillary: 165 mg/dL — ABNORMAL HIGH (ref 70–99)
Glucose-Capillary: 168 mg/dL — ABNORMAL HIGH (ref 70–99)
Glucose-Capillary: 200 mg/dL — ABNORMAL HIGH (ref 70–99)
Glucose-Capillary: 200 mg/dL — ABNORMAL HIGH (ref 70–99)

## 2019-05-06 LAB — CALCIUM, IONIZED: Calcium, Ionized, Serum: 3.6 mg/dL — ABNORMAL LOW (ref 4.5–5.6)

## 2019-05-06 MED ORDER — ACD FORMULA A 0.73-2.45-2.2 GM/100ML VI SOLN
3000.0000 mL | Status: AC
  Administered 2019-05-06 – 2019-05-09 (×10): 3000 mL via INTRAVENOUS_CENTRAL
  Filled 2019-05-06: qty 3000
  Filled 2019-05-06: qty 1000
  Filled 2019-05-06 (×4): qty 3000
  Filled 2019-05-06: qty 2000

## 2019-05-06 MED ORDER — SODIUM BICARBONATE 8.4 % IV SOLN
50.0000 meq | Freq: Once | INTRAVENOUS | Status: AC
  Administered 2019-05-06: 50 meq via INTRAVENOUS
  Filled 2019-05-06: qty 50

## 2019-05-06 MED ORDER — VANCOMYCIN HCL 2000 MG/400ML IV SOLN
2000.0000 mg | Freq: Once | INTRAVENOUS | Status: AC
  Administered 2019-05-06: 2000 mg via INTRAVENOUS
  Filled 2019-05-06: qty 400

## 2019-05-06 MED ORDER — ACD FORMULA A 0.73-2.45-2.2 GM/100ML VI SOLN
2000.0000 mL | Status: DC
  Filled 2019-05-06: qty 2000

## 2019-05-06 MED ORDER — MILRINONE LACTATE IN DEXTROSE 20-5 MG/100ML-% IV SOLN
0.1250 ug/kg/min | INTRAVENOUS | Status: DC
  Administered 2019-05-06 – 2019-05-09 (×8): 0.25 ug/kg/min via INTRAVENOUS
  Administered 2019-05-10 – 2019-05-15 (×6): 0.125 ug/kg/min via INTRAVENOUS
  Filled 2019-05-06 (×16): qty 100

## 2019-05-06 MED ORDER — PRISMASOL BGK 0/2.5 32-2.5 MEQ/L IV SOLN
INTRAVENOUS | Status: DC
  Filled 2019-05-06 (×4): qty 5000

## 2019-05-06 MED ORDER — PRISMASOL B22GK 4/0 22-4 MEQ/L REPLACEMENT SOLN
Status: DC
  Filled 2019-05-06 (×8): qty 5000

## 2019-05-06 MED ORDER — CALCIUM GLUCONATE-NACL 1-0.675 GM/50ML-% IV SOLN
1.0000 g | Freq: Once | INTRAVENOUS | Status: AC
  Administered 2019-05-06: 1000 mg via INTRAVENOUS
  Filled 2019-05-06: qty 50

## 2019-05-06 MED ORDER — PRISMASOL B22GK 4/0 22-4 MEQ/L IV SOLN
INTRAVENOUS | Status: DC
  Filled 2019-05-06 (×40): qty 5000

## 2019-05-06 MED ORDER — SODIUM CHLORIDE 0.9 % FOR CRRT
INTRAVENOUS_CENTRAL | Status: DC | PRN
  Filled 2019-05-06: qty 1000

## 2019-05-06 MED ORDER — PIPERACILLIN-TAZOBACTAM 3.375 G IVPB
3.3750 g | Freq: Four times a day (QID) | INTRAVENOUS | Status: DC
  Administered 2019-05-06 – 2019-05-08 (×9): 3.375 g via INTRAVENOUS
  Filled 2019-05-06 (×14): qty 50

## 2019-05-06 MED ORDER — PRISMASOL B22GK 4/0 22-4 MEQ/L REPLACEMENT SOLN
Status: DC
  Filled 2019-05-06 (×13): qty 5000

## 2019-05-06 MED ORDER — DEXTROSE 5 % IV SOLN
20.0000 g | INTRAVENOUS | Status: DC
  Administered 2019-05-06 – 2019-05-10 (×7): 20 g via INTRAVENOUS_CENTRAL
  Filled 2019-05-06 (×9): qty 200

## 2019-05-06 MED ORDER — PRISMASOL BGK 0/2.5 32-2.5 MEQ/L IV SOLN
INTRAVENOUS | Status: DC
  Filled 2019-05-06 (×5): qty 5000

## 2019-05-06 MED ORDER — INSULIN ASPART 100 UNIT/ML IV SOLN
5.0000 [IU] | Freq: Once | INTRAVENOUS | Status: AC
  Administered 2019-05-06: 5 [IU] via INTRAVENOUS

## 2019-05-06 MED ORDER — ALBUTEROL SULFATE (2.5 MG/3ML) 0.083% IN NEBU
10.0000 mg | INHALATION_SOLUTION | Freq: Once | RESPIRATORY_TRACT | Status: AC
  Administered 2019-05-06: 08:00:00 10 mg via RESPIRATORY_TRACT
  Filled 2019-05-06: qty 12

## 2019-05-06 MED ORDER — DEXTROSE 50 % IV SOLN
1.0000 | Freq: Once | INTRAVENOUS | Status: AC
  Administered 2019-05-06: 50 mL via INTRAVENOUS
  Filled 2019-05-06: qty 50

## 2019-05-06 MED ORDER — PRISMASOL BGK 0/2.5 32-2.5 MEQ/L IV SOLN
INTRAVENOUS | Status: DC
  Filled 2019-05-06 (×2): qty 5000

## 2019-05-06 MED ORDER — ACD FORMULA A 0.73-2.45-2.2 GM/100ML VI SOLN
3000.0000 mL | Status: DC
  Filled 2019-05-06: qty 3000

## 2019-05-06 MED ORDER — VANCOMYCIN HCL IN DEXTROSE 1-5 GM/200ML-% IV SOLN
1000.0000 mg | INTRAVENOUS | Status: DC
  Administered 2019-05-07 – 2019-05-08 (×2): 1000 mg via INTRAVENOUS
  Filled 2019-05-06 (×2): qty 200

## 2019-05-06 NOTE — Progress Notes (Signed)
STROKE TEAM PROGRESS NOTE   INTERVAL HISTORY Patient   is a little bit more physically active and moves his neck side-to-side , and intermittently yawning and moving n his lower extremities sometimes but not to commands.  He does have some horizontal roving eye movements but again will not track or follow commands consistently.  Continues to be critically ill with renal failure requiring CRRT for dialysis as well as ventilatory support for respiratory failure.  And heparin-induced thrombocytopenia.  He remains hypotensive and is vasopressor dependent.  CCM team plans tracheostomy soon.  He has a new dialysis catheter and hemodialysis will be started soon   OBJECTIVE Vitals:   05/06/19 1330 05/06/19 1345 05/06/19 1400 05/06/19 1441  BP: 106/79 116/75 (!) 82/48 131/71  Pulse: 72 79 66 82  Resp: 17 17 17 14   Temp:      TempSrc:      SpO2: 100% 100% 100% 100%  Weight:      Height:       CBC:  Recent Labs  Lab 05/05/19 0500 05/05/19 0501 05/05/19 0850 05/06/19 0500  WBC 16.3*  --   --  15.6*  HGB 11.2*   < > 12.2* 9.7*  HCT 35.2*   < > 36.0* 31.1*  MCV 96.7  --   --  97.2  PLT 123*  --   --  165   < > = values in this interval not displayed.   Basic Metabolic Panel:  Recent Labs  Lab 05/05/19 0500 05/05/19 0501 05/05/19 1657 05/05/19 1657 05/06/19 0500 05/06/19 0500 05/06/19 0807 05/06/19 1156  NA 134*   < > 137   < > 134*  --  134*  --   K 4.6   < > 5.3*   < > 7.3*   < > 6.4* 5.7*  CL 98   < > 101   < > 97*  --  99  --   CO2 19*   < > 20*   < > 20*  --  19*  --   GLUCOSE 209*   < > 82   < > 171*  --  257*  --   BUN 63*   < > 79*   < > 110*  --  119*  --   CREATININE 3.05*   < > 4.01*   < > 6.23*  --  6.48*  --   CALCIUM 7.8*   < > 9.1   < > 9.2  --  9.3  --   MG 2.3  --   --   --  2.3  --   --   --   PHOS 4.4   < > 6.1*  --  7.2*  --   --   --    < > = values in this interval not displayed.    IMAGING CT HEAD CODE STROKE WO CONTRAST 05/05/2019 1. Asymmetric  hyperdensity of a Left MCA branch in the Sylvian fissure suspicious for emergent large vessel occlusion in the setting.  2. No associated acute cortically based infarct, ASPECTS 10. No acute hemorrhage.  3. Chronic right frontal operculum encephalomalacia.   CT Code Stroke CTA Head W/WO contrast CT Code Stroke CTA Neck W/WO contrast 04/20/2019 1. Positive for emergent large vessel occlusion at the Left MCA bifurcation. There is reconstitution of the dominant posterior division branch.  2. Elsewhere negative CTA head and neck. Dominant right vertebral artery which supplies the basilar. Fetal type PCA origins.   CT CEREBRAL  PERFUSION W CONTRAST 04/19/2019 1. CTP detects evidence of Left MCA territory oligemia concordant with the CTA findings.  2. No infarct core detected with standard CBF <30%.  3. 60 mL of penumbra detected with standard T-max > 6s.   CT HEAD WO CONTRAST 04/23/2019 Small region of loss of gray-white differentiation in the left frontal operculum consistent with a fairly small area of acute infarction compared to the previous perfusion abnormality. No hemorrhage or mass effect. Areas of low-density in the cerebellum that I favor are artifactual. There did not seem to be any abnormalities in that region on the previous examinations.   MRI / MRA Head WO Contrast  04/28/2019 Extensive acute infarction in the right cerebellum with swelling and petechial blood products. Acute infarction of the superior cerebellar peduncle on the right, with some infarction in the mid brain. Extensive acute infarction in the right temporal lobe and insula. Small acute infarctions in the frontal operculum region and right parietal region. Infarction affecting the right posterior basal ganglia and caudate nucleus. Petechial blood products present without frank hematoma. Acute infarction in the left insula and frontal operculum. Petechial blood products present without frank hematoma. Small punctate acute  infarctions higher in the left frontoparietal junction region. No large or medium vessel occlusion seen presently. Restoration of flow in the left MCA. Flow appears to be present in posterior circulation main vessels and branch vessels presently.   DG CHEST PORT 1 VIEW 05/03/2019 1. Coarsening patchy opacity in the left lung and right lung base, asymmetric edema versus infection. 2. Stable cardiomegaly. 3. Stable support apparatus. 04/30/2019 Improvement of bilateral airspace disease. 04/29/2019 Patchy opacities consistent with the given clinical history. Tubes and lines as described.  04/27/2019 Perhaps slightly improved aeration at the left lung base. Otherwise no change in the bilateral patchy airspace and interstitial changes, likely associated with small effusions. Lines and tubes unchanged. 04/26/2019 0854 1. Lines and tubes as described above. 2. Cardiomegaly with basilar airspace disease, likely associated with effusions. 3. Interstitial and alveolar opacities elsewhere may represent a combination of infection and edema and are little changed. 04/25/2019 0946 Overall improved aeration from the prior exam although persistent opacities and small effusions are noted. 04/23/19 1636 1. Interval placement of a right neck vascular sheath, tip positioned over the right brachiocephalic vein. 2.  Other support apparatus in unchanged position. 3. Slight interval increase in diffuse bilateral interstitial and heterogeneous airspace opacity, consistent with multifocal infection, edema, and/or ARDS. 4. Cardiomegaly. 04/23/2019 0757 Increasing patchy bilateral airspace opacities which may represent edema or infection. 04/21/2019 1958 1. Interval placement of neck vascular catheter, tip positioned near the superior cavoatrial junction.  2. Interval retraction of endotracheal tube, now positioned just above the thoracic inlet, approximately 6.5 cm above the carina.  3. Interval placement of esophagogastric tube, tip  and side port below the diaphragm. 4. Stable cardiomegaly and diffuse bilateral interstitial airspace opacity, likely edema.  04/30/2019 0841 1. Endotracheal tube tip noted just above the carina. Proximal repositioning of 2-3 should be considered. 2. Cardiomegaly again noted. Diffuse bilateral pulmonary infiltrates/edema. Small left pleural effusion.   ECHOCARDIOGRAM COMPLETE 05/08/2019 1. Left ventricular ejection fraction, by visual estimation, is <20%. The left ventricle has severely decreased function. There is no left ventricular hypertrophy.   2. Apex is not well-visualized. In setting of stroke with severe LV systolic dysfunction, recommend repeat TTE with contrast to rule out LV apical thrombus   3. Severely dilated left ventricular internal cavity size.   4. Left ventricular  diastolic parameters are consistent with Grade III diastolic dysfunction (restrictive).   5. Elevated left atrial pressure.   6. The left ventricle demonstrates global hypokinesis.   7. Global right ventricle has normal systolic function.The right ventricular size is mildly enlarged.   8. Left atrial size was moderately dilated.   9. Right atrial size was normal.  10. The mitral valve is normal in structure. Mild mitral valve regurgitation.  11. The tricuspid valve is normal in structure. Tricuspid valve regurgitation is trivial.  12. The aortic valve is normal in structure. Aortic valve regurgitation is not visualized. No evidence of aortic valve sclerosis or stenosis.  13. The pulmonic valve was not well visualized. Pulmonic valve regurgitation is not visualized.   Interventional Neuro Radiology - Cerebral Angiogram with Intervention 04/23/2019 S/P Lt common carotid arteriogram followed by complete revascularization of occluded LT MCA sup division mid M2 seg with x 1 pass with 46mx 40 mm solitaire X ret river device and penumbra aspiration with TICI 3 revascularization  ECG - SR rate 93 BPM. (See cardiology reading  for complete details)  PHYSICAL EXAM     Temp:  [98.9 F (37.2 C)-103 F (39.4 C)] 99.8 F (37.7 C) (02/19 0800) Pulse Rate:  [65-98] 82 (02/19 1441) Resp:  [14-26] 14 (02/19 1441) BP: (82-131)/(48-92) 131/71 (02/19 1441) SpO2:  [100 %] 100 % (02/19 1441) Arterial Line BP: (89-137)/(49-79) 95/49 (02/19 1400) FiO2 (%):  [40 %] 40 % (02/19 1441) Weight:  [114.2 kg] 114.2 kg (02/19 0500)  General - obese young [place], intubated off precedex.  Ophthalmologic - fundi not visualized due to noncooperation.  Cardiovascular - Regular rhythm and rate.  Neuro - intubated    Eyes are closed but able to open with repetitive stimulation.  He does intermittently spontaneously move his neck from side to side.  He does not follow, commands or gaze but has intermittent tracking to side of voice and blinking to visual threat but not consistent. PERRL. Corneal reflex present, gag and cough present. Breathing over the vent. Facial symmetry not able to test due to ET tube.  Tongue protrusion not cooperative. On pain stimulation, no movement of both upper extremities but has trace withdrawal in both lower extremities..Marland KitchenDTR 1+ and bilateral positive babinski.  He does not grimace to painful stimuli.   ASSESSMENT/PLAN Carlos Brewer is a 34y.o. male with history of NICM EF 15-20%, CHF, DM, Obesity, tobacco use, OSA, and hx of cocaine use presenting with aphasia and right sided weakness. The patient received IV t-PA on Friday 04/26/2019 at 4:45 AM. Mechanical thrombectomy left M2.  Stroke:  Extensive R cerebellar, midbrain, R MCA and L MCA infarcts s/p tPA & IR with left M2 TICI3 reperfusion - embolic - likely secondary to severe cardiomyopathy with low EF and LV thrombus  Code Stroke CT Head - left MCA hyperdensity.   CTA H&N - emergent large vessel occlusion at the Left MCA bifurcation.   CT Perfusion - 60 mL of penumbra detected.   CT head repeat - Small region of loss of  gray-white differentiation in the left frontal operculum   MRI head - 04/28/2019 - extensive R cerebellar infarct w/ petechial hemorrhage. R cerebellar peduncle and midbrain infarct. Extensive R temporal lobe and insula infarct w/ R frontal operculum and parietal infarcts. R posterior basal ganglia and caudate head infarct w/ petechial hemorrhage. L insula and L frontal operculum infarcts w/ petechial hemorrhage. Small L frontoparietal jxn infarcts.   MRA  head restoration L MCA flow. Flow in posterior circulation.   2D Echo - EF < 20%   2D with contrast - possible LV apical thrombus 0.8 x 0.6  Sars Corona Virus 2 - negative  LDL - 203  HgbA1c - 6.5  VTE prophylaxis - angiomax IV  aspirin 81 mg daily prior to admission, IV heparin on hold d/t thrombocytopenia, now on angiomax. HIT elevated. SRA pending. As PLTS increasing, added low dose aspirin 81 to Angiomax.     Therapy recommendations:  pending - order when able to participate   Disposition:  Pending  Change neuro checks to q2h  Encephalopathy  Not following commands or moving extremities  MRI extensive R and L brain and R cerebellar infarcts  Prognosis is poor, concerning for vegetative state vs minimally conscious state  Fiance present on camera during rounds again today. Clarified that mother is next of kin. Fiance designated by pt on arrival to receive information. Fiance agreeable to clarification. Ongoing GOC discussion  ? HIT  On heparin IV for LV thrombus  Platelet [id number]  Heparin discontinued  HIT panel sent - HIT ab elevated. SRA pending    On Angiomax IV  CCM on board  Platelet monitoring  Acute Hypoxemic Respiratory failure  Intubated  Off sedation now   CCM on board  CXR - 05/03/19 - coarsening patchy opacity L and R lung bases edema vs infection  Prn precedex as BP will allow  For trach   Starting spontaneous breathing trails  Cardiogenic vs. Septic shock  Fever,  TMax afebrile  Leukocytosis, WBCs 9.4  CXR - 04/23/19 - consistent with multifocal infection, edema, and/or ARDS (unchanged) CXR - 04/30/19 - Improvement of bilateral airspace disease.  On Meropenum 04/23/19>>04/29/19  On Vancomycin 04/23/19>> 04/26/19  On hydrocortisone->off  U/A neg, Urine culture no growth   Blood cultures 2/6 neg   resp Cx 2/16 reincubated  Off pressors    CCM plans low dose coreg, dig if remains off pressors   LV thrombus Severe nonischemic cardiomyopathy Chronic systolic CHF  2D Echo - EF < 20%   2D with contrast - possible LV apical thrombus 0.8 x 0.6  On heparin IV -> angiomax IV    On amiodarone gtt for wide-complex tachycardia and hypotension -> changed to po  Add beta blocker once BP allows  Cardiology on board  Polymorphic V-tach / SVT [place]  Elevated troponin Prolonged QT  On amiodarone drip -> now changed to po  Troponin trending down 16274->14226->2409   Cardiology on board  May consider cardioversion if needed  AKI  Creatinine - 2.72  BUN 59  Renal consult 04/23/19  Still minimal urinary output, no signs of renal recovery  On CRRT, pharmacist contacted about removing heparin from soln  Plan transition to intermittent dialysis  Hypotension w/ shock Hx Hypertension  Home BP meds: Coreg, Entresto  On pressors - Neo  SBP goal  >110  Stable  Shock liver d/t hypoperfusion, improving  GI on board  AST/ALT - >10000/10132 ->... ->57/192 (05/04/19)    INR 3.1->1.8->1.2  CK 602  Hyperlipidemia  Home Lipid lowering medication: Lipitor 40 mg daily  LDL 203, goal < 70  Ok to add back: Lipitor 40 mg daily   Will not use high intensity statin at this time given hx shock liver  Continue statin at discharge  Diabetes type II, uncontrolled  Home diabetic meds: insulin  Current diabetic meds: SS insulin, Levemir 20, novolog 4u q6h  HgbA1c 6.5, goal <  7.0  CBG monitoring   SSI  Dysphagia . Secondary to  stroke . NPO . On tube feeding @ 50 . Speech on board  Tobacco abuse  Current smoker  Mom confirmed tobacco use  Smoking cessation counseling will be provided  Other Stroke Risk Factors  ETOH use, advised to drink no more than 1 alcoholic beverage per day.  Hx cocaine abuse, UDS neg   Obesity, Body mass index is 33.22 kg/m., recommend weight loss, diet and exercise as appropriate   Obstructive sleep apnea - uses Cpap  Substance Abuse Hx of cocaine use  Other Active Problems  Code status - Full code  Hyperkalemia - 4.0 resolved  Metabolic acidosis, resolved  Depression on prozac  Hypermagnesemia - 2.7-2.7  Hyperphosphatemia - 4.8/5.0->5.0  Hospital day # 14  Neurological exam remains poor with patient only partially arousable but not following any commands with intermittent roving eye movements and some spontaneous neck and leg movements but not tracking consistently. Patient remains critically ill with respiratory failure requiring ventilatory support as well as CRRT for renal failure.  Platelet counts also appear to be low likely due to heparin-induced thrombocytopenia but appear to be improving   Discussed with Dr. Elsworth Soho. CCM, and Dr. Marval Regal Nephrologist This patient is critically ill and at significant risk of neurological worsening, death and care requires constant monitoring of vital signs, hemodynamics,respiratory and cardiac monitoring, extensive review of multiple databases, frequent neurological assessment, discussion with family, other specialists and medical decision making of high complexity.I have made any additions or clarifications directly to the above note.This critical care time does not reflect procedure time, or teaching time or supervisory time of PA/NP/Med Resident etc but could involve care discussion time.  I spent 30 minutes of neurocritical care time  in the care of  this patient.   Antony Contras, MD  To contact Stroke Continuity  provider, please refer to [email or web address] After hours, contact General Neurology

## 2019-05-06 NOTE — Progress Notes (Signed)
NAME:  Carlos Hires., MRN:  [id number], DOB:  30-Sep-1985, LOS: 64 ADMISSION DATE:  04/25/2019, CONSULTATION DATE:  05/07/2019 REFERRING MD:  Dr. Leonel Ramsay, CHIEF COMPLAINT:  Slurred speech  Brief History   34 yo male smoker found to have slurred speech and Rt sided weakness.  CT head showed M2 occlusion.  Ultimately treated with thrombolytic and thrombectomy by IR.  Required intubation for airway protection. Course complicated by septic shock, AKI requiring CRRT and polymorphic VT  Past Medical History  Systolic CHF with non ischemic CM, Cocaine abuse, OSA, DM Hx of CHF (EF 15%)  Hx myocarditis Smoker 1/2 ppd   Significant Hospital Events   2/05 Admit, tPA, IR thrombectomy 2/6 100% on fvent at 2300  Overnight requiring increasing levophed and phenylephrine.    2/6: switched to levophed, epi, started antibiotics, started on CRRT.  2/9  developed wide-complex tachycardia and hypotension, started on amiodarone drip, increase Levophed drip 2/15 drop in platelets, heparin stopped, bival started 2/17 Hypotensive and back on Levophed 2/18 trach, lines changed   Consults:  Neuro IR  Procedures:  ETT 2/05 >> 2/18 [facility] CVL 2/5 >>2/18  RT [place] CVL 2/18 >> [facility] HD cath 2/6 >>2/18  RT fem HD 2/19 >>  2/5-2/6:  TPA given at 428 am (total of 90 Mg) 520 am went to IR  S/P Lt common carotid arteriogram followed by complete revascularization of occluded LT MCA sup division mid M2 seg with x 1 pass with 46mx 40 mm solitaire X ret river device and penumbra aspiration with TICI 3 revascularization   Significant Diagnostic Tests:  CT angio head/neck 2/05 >> occlusion of Lt MCA bifurcation Echo 2/05 >> EF less than 20%, cannot rule out apical thrombus MRI brain 2/11 > extensive acute infarction of multiple areas without large or medium vessel occlusion Echocardiogram 2/8 Left ventricular ejection fraction, by estimation, is <20%. The left  ventricle has severely decreased function.  The left ventricle demonstrates  global hypokinesis. The left ventricular internal cavity size was severely  dilated. Possible small 0.8 x  0.6 cm apical thrombus. Somewhat subtle finding, could confirm with  cardiac MRI. However, if patient has had CVA, would be reasonable to  anticoagulate given severity of LV dysfunction if no contraindications to  anticoagulation.   Micro Data:  SARS CoV2 PCR 2/05 >> negative Influenza PCR 2/05 >> negative Urine 2/6 >> ng resp 2/6 >> nml flora BC 2/6 >>ng resp 2/16 >> [facility] >>  Antimicrobials:  mero 2/6 -2/12 Zosyn 2/6 , 2/19 >> vanc 2/6 >> 2/9 , 2/19 >>  Interim history/subjective:   Febrile 103 Remains critically ill, off CRRT Anuric Off Levophed this morning   Objective   Blood pressure 98/68, pulse 74, temperature 99.8 F (37.7 C), temperature source Axillary, resp. rate (!) 24, height 6' 1"  (1.854 m), weight 114.2 kg, SpO2 100 %.    Vent Mode: PRVC FiO2 (%):  [40 %] 40 % Set Rate:  [24 bmp] 24 bmp Vt Set:  [630 mL] 630 mL PEEP:  [5 cmH20] 5 cmH20 Plateau Pressure:  [22 cmH20-25 cmH20] 23 cmH20   Intake/Output Summary (Last 24 hours) at 05/06/2019 0926 Last data filed at 05/06/2019 0800 Gross per 24 hour  Intake 1137.85 ml  Output 50 ml  Net 1087.85 ml   Filed Weights   05/04/19 0500 05/05/19 0500 05/06/19 0500  Weight: 120 kg 115.4 kg 114.2 kg    Examination:  General -young well-built man, intubated, in no distress  Eyes -pupils reactive , mild pallor, no icterus ENT -tracheostomy site, no bleeding Cardiac -S1-S2 regular, no murmur Chest - bilateral ventilated breath sounds, mild blood-tinged secretions Abdomen - soft, non tender, + bowel sounds Extremities - no cyanosis, clubbing, or edema Skin - no rashes Neuro - awake, tracks but does not follow commands, yawning and chewing movements   Chest x-ray 2/18 personally reviewed -patchy opacity bilateral slight progression right upper  Labs show mild  leukocytosis, per kalemia improved from 7.3-6.4, rising creatinine, stable anemia   Resolved Hospital Problem list     Assessment & Plan:   Septic shock vs cardiogenic shock -resolved 2/15, back on pressors 2/17 Pressor requirements seem to be related to sedation and cardiogenic rather than sepsis Fever 2/18 -Co-ox low, start milrinone 0.25  -Completed 7 days of meropenem for UTI/pneumonia -We will resume empiric Zosyn/vancomycin for H CAP/line sepsis  Cardiogenic shock Left ventricle apical thrombus non ischemic cardiomyopathy with systolic congestive heart failure Polymorphic VT on 2/9 -Cardiology following -Continue amiodarone PO -Add low-dose Coreg if remains off Levophed consistently for 2 to 3 days -Lipitor, digoxin -Medications on hold include Coreg, Zaroxolyn, Entresto, Aldactone, torsemide  Acute hypoxemic respiratory failure Tracheostomy status  -Proceed with spontaneous breathing trials with goal trach collar   Acute kidney injury Hyperkalemia -Remains anuric, no evidence of renal recovery -Resume CRRT ,NO heparin, use citrate instead   Left MCA CVA Multiple other infarcts noted on MRI 0/16-[id number] embolic Acute metabolic encephalopathy -Intermittent fentanyl, versed -Ct  Precedex , goal RASS 0 to -1   Thrombocytopenia - HIT +, await SRA, platelets recovering -Continue Angiomax  - avoid heparin (listed as allergy now)   DM type II poorly controlled. - SSI -Ct 20 units Levemir daily -Ct 4 units NovoLog every 4 hours TF coverage -CBG 1 40-180 acceptable  Hx of cocaine abuse. depression. - not active per UDS  - continue prozac  Summary-developed hyperkalemia and rising creatinine within 24 hours of stopping CRRT, indicating that he is catabolic.  Has high fever-so we will treat empirically for line sepsis/H CAP, presently off pressors  Best practice:  Diet: NPO,  TFs DVT prophylaxis: SCDs GI prophylaxis: protonix Mobility: bed rest Code Status:  full code Disposition: ICU Family -fianc [name] & mom updated  The patient is critically ill with multiple organ systems failure and requires high complexity decision making for assessment and support, frequent evaluation and titration of therapies, application of advanced monitoring technologies and extensive interpretation of multiple databases. Critical Care Time devoted to patient care services described in this note independent of APP/resident  time is 35 minutes.     [name] MD. [place]. [facility]  If no response to pager , please call [phone]   05/06/2019

## 2019-05-06 NOTE — Progress Notes (Signed)
PT Cancellation Note  Patient Details Name: Carlos Brewer. MRN: [id number] DOB: 1985-05-07   Cancelled Treatment:    Reason Eval/Treat Not Completed: Medical issues which prohibited therapy - Pt remains intubated, sedated, on CRRT. PT to sign off, please reconsult when pt is medically ready.   [place] Pager [phone]  Office [phone]    Roxine Caddy D Elonda Husky 05/06/2019, 11:38 AM

## 2019-05-06 NOTE — Procedures (Signed)
Cortrak  Person Inserting Tube:  Jaqualin Serpa, RD Tube Type:  Cortrak - 43 inches Tube Location:  Right nare Initial Placement:  Stomach Secured by: Bridle Technique Used to Measure Tube Placement:  Documented cm marking at nare/ corner of mouth Cortrak Secured At:  70 cm    No x-ray is required. RN may begin using tube.   If the tube becomes dislodged please keep the tube and contact the Cortrak team at [email or web address] (password [email or web address]) for replacement.  If after hours and replacement cannot be delayed, place a NG tube and confirm placement with an abdominal x-ray.    Naethan Bracewell RD, LDN Clinical Nutrition Pager listed in AMION   

## 2019-05-06 NOTE — Consult Note (Addendum)
Advanced Heart Failure Team Consult Note   Primary Physician: Patient, No Pcp Per PCP-Cardiologist:  Ida Rogue, MD  [place]: Dr. Haroldine Laws   Reason for Consultation: cardiogenic shock   HPI:    Carlos Novosel. is seen today for evaluation of cardiogenic shock at the request of Dr. Elsworth Soho, Critical Care  Carlos Brewer. is a 34 y.o. male with a history of chronic systolic HF, cocaine abuse, OSA on CPAP, tobacco abuse, obesity and hx of acute myocarditis 2018. Echo 10/2016 EF 15-20%.LHC in 2018 with no significant coronary disease. Cause of cardiomyopathy thought to be heavy ETOH + cocaine. Not candidate for advanced therapies/home inotropes with substance abuse.  Admitted 4/28-07/17/18 with volume overload. Echo showed EF down to 10-15%. Diuresed with IV lasix and metolazone then transitioned to torsemide 40 mg BID. HF meds optimized. He was given metolazone to take PRN. DC weight 276 lbs.   Admitted 2/5 w/ acute MCA stroke, treated w/ tPA and IR thrombectomy. Source felt cardioembolic. Echo showed LV thrombus. EF < 20%. Started on heparin but develop thrombocytopenia.  Heparin switched to bivalirudin 2/2 concern for HITT. Course further c/b shock (felt combined septic + cardiogenic shock) and MSOF. Critically ill, remains intubated and requiring CRRT for renal failure. Neurological status is poor. He is arousable  but not able to follow commands. Also w/ polymorphic VT requiring amiodarone.   He remains HD dependent. Oliguric. SCr 6.48 (baseline 1). K 5.7. Could not tolerate stopping CVVHD due to hypercatabolic state. Unable to wean off inotrope/ pressor support. Remains on milrinone 0.25. Intermittently requiring NE. Currently off.    Echo 05/02/2019 1. Left ventricular ejection fraction, by visual estimation, is <20%. The left ventricle has severely decreased function. There is no left ventricular hypertrophy. 2. Apex is not well-visualized. In setting of stroke with  severe LV systolic dysfunction, recommend repeat TTE with contrast to rule out LV apical thrombus 3. Severely dilated left ventricular internal cavity size. 4. Left ventricular diastolic parameters are consistent with Grade III diastolic dysfunction (restrictive). 5. Elevated left atrial pressure. 6. The left ventricle demonstrates global hypokinesis. 7. Global right ventricle has normal systolic function.The right ventricular size is mildly enlarged. 8. Left atrial size was moderately dilated. 9. Right atrial size was normal. 10. The mitral valve is normal in structure. Mild mitral valve regurgitation. 11. The tricuspid valve is normal in structure. Tricuspid valve regurgitation is trivial. 12. The aortic valve is normal in structure. Aortic valve regurgitation is not visualized. No evidence of aortic valve sclerosis or stenosis. 13. The pulmonic valve was not well visualized. Pulmonic valve regurgitation is not visualized.  Review of Systems: [y] = yes, [ ]  = no   . General: Weight gain [ ] ; Weight loss [ ] ; Anorexia [ ] ; Fatigue [ ] ; Fever [ ] ; Chills [ ] ; Weakness [ ]   . Cardiac: Chest pain/pressure [ ] ; Resting SOB [ ] ; Exertional SOB [ ] ; Orthopnea [ ] ; Pedal Edema [ ] ; Palpitations [ ] ; Syncope [ ] ; Presyncope [ ] ; Paroxysmal nocturnal dyspnea[ ]   . Pulmonary: Cough [ ] ; Wheezing[ ] ; Hemoptysis[ ] ; Sputum [ ] ; Snoring [ ]   . GI: Vomiting[ ] ; Dysphagia[ ] ; Melena[ ] ; Hematochezia [ ] ; Heartburn[ ] ; Abdominal pain [ ] ; Constipation [ ] ; Diarrhea [ ] ; BRBPR [ ]   . GU: Hematuria[ ] ; Dysuria [ ] ; Nocturia[ ]   . Vascular: Pain in legs with walking [ ] ; Pain in feet with lying flat [ ] ; Non-healing sores [ ] ;  Stroke [ ] ; TIA [ ] ; Slurred speech [ ] ;  . Neuro: Headaches[ ] ; Vertigo[ ] ; Seizures[ ] ; Paresthesias[ ] ;Blurred vision [ ] ; Diplopia [ ] ; Vision changes [ ]   . Ortho/Skin: Arthritis [ ] ; Joint pain [ ] ; Muscle pain [ ] ; Joint swelling [ ] ; Back Pain [ ] ; Rash [ ]   . Psych:  Depression[ ] ; Anxiety[ ]   . Heme: Bleeding problems [ ] ; Clotting disorders [ ] ; Anemia [ ]   . Endocrine: Diabetes [ ] ; Thyroid dysfunction[ ]   Home Medications Prior to Admission medications   Medication Sig Start Date End Date Taking? Authorizing Provider  albuterol (ACCUNEB) 1.25 MG/3ML nebulizer solution Take 3 mLs by nebulization 4 (four) times daily as needed for wheezing or shortness of breath. 08/23/18  Yes [provider]  albuterol (VENTOLIN HFA) 108 (90 Base) MCG/ACT inhaler Inhale 1-2 puffs into the lungs every 6 (six) hours as needed for wheezing or shortness of breath. 07/17/18  Yes Florencia Reasons, MD  aspirin EC 81 MG EC tablet Take 1 tablet (81 mg total) by mouth daily. Patient taking differently: Take 81 mg by mouth at bedtime.  10/30/16  Yes Demetrios Loll, MD  atorvastatin (LIPITOR) 40 MG tablet Take 1 tablet (40 mg total) by mouth daily at 6 PM. 08/23/18  Yes Makinzey Banes, Shaune Pascal, MD  bacitracin ointment Apply 1 application topically 2 (two) times daily. Patient taking differently: Apply 1 application topically 2 (two) times daily as needed for wound care.  08/28/18  Yes Montine Circle, PA-C  carvedilol (COREG) 3.125 MG tablet Take 1 tablet (3.125 mg total) by mouth 2 (two) times daily with a meal. 08/23/18  Yes Naje Rice, Shaune Pascal, MD  digoxin (LANOXIN) 0.125 MG tablet Take 1 tablet (0.125 mg total) by mouth daily. 08/23/18  Yes Trenyce Loera, Shaune Pascal, MD  FLUoxetine (PROZAC) 20 MG capsule Take 1 capsule (20 mg total) by mouth daily. 12/19/18  Yes Gherghe, Vella Redhead, MD  insulin aspart (NOVOLOG) 100 UNIT/ML injection Inject 10 Units into the skin 3 (three) times daily before meals.   Yes [provider]  insulin detemir (LEVEMIR) 100 UNIT/ML injection Inject 40 Units into the skin at bedtime.    Yes [provider]  metolazone (ZAROXOLYN) 2.5 MG tablet Take 1 tablet (2.5 mg total) by mouth daily as needed for up to 30 days. Take metolazone 2.47m x1 only for 5pound weight  gain, please take potassium 250m x1 with metolazone. Patient taking differently: Take 2.5 mg by mouth daily as needed (for 5 lb weight gain (take additional potassium 20 meq with each dose).  07/17/18 06/15/19 Yes XuFlorencia ReasonsMD  Multiple Vitamin (MULTIVITAMIN WITH MINERALS) TABS tablet Take 1 tablet by mouth daily with lunch.   Yes [provider]  pantoprazole (PROTONIX) 40 MG tablet Take 40 mg by mouth daily as needed (acid reflux/indigestion).    Yes [provider]  potassium chloride SA (K-DUR) 20 MEQ tablet Take 1 tablet (20 mEq total) by mouth daily. Can take extra one if taking metolazone. Patient taking differently: Take 20 mEq by mouth See admin instructions. Take one tablet (20 meq) by mouth every morning, also take an extra tablet (20 meq) with each dose of metolazone 08/23/18  Yes Mousa Prout, DaShaune PascalMD  sacubitril-valsartan (ENTRESTO) 24-26 MG Take 1 tablet by mouth 2 (two) times daily. 08/23/18  Yes Savon Cobbs, DaShaune PascalMD  spironolactone (ALDACTONE) 25 MG tablet Take 1 tablet (25 mg total) by mouth daily. Patient taking differently: Take 25 mg by  mouth at bedtime.  08/23/18  Yes Dashae Wilcher, Shaune Pascal, MD  torsemide (DEMADEX) 20 MG tablet Take 2 tablets (40 mg total) by mouth 2 (two) times daily. Patient taking differently: Take 40 mg by mouth daily.  08/23/18  Yes Kamerin Grumbine, Shaune Pascal, MD  Vitamin D, Ergocalciferol, (DRISDOL) 1.25 MG (50000 UNIT) CAPS capsule Take 50,000 Units by mouth every Thursday.   Yes [provider]    Past Medical History: Past Medical History:  Diagnosis Date  . CHF (congestive heart failure) ([place])   . Cocaine abuse ([place])   . HFrEF (heart failure with reduced ejection fraction) ([place])    a. 10/2016 Echo: EF 15-20%, Gr2 DD, mildly dil LA/RA.  Marland Kitchen History of cardiac cath    a. 10/2016 Cath: LM nl, LAD min irregs, LCX nl, RCA nl, EF 15%.  . Morbid obesity ([place])   . Myocarditis ([place])    a. 10/2016 Admit w/ CHF and trop elevation; b. 10/2016 Echo:  EF 15-20%; c. 10/2016 Cath: Min irregs in LAD otw nl cors, EF 15%, glob HK.  Marland Kitchen NICM (nonischemic cardiomyopathy) ([place])    a. 10/2016 Echo: EF 15-20%, Gr2 DD; b. 10/2016 Cath: Nl cors.  . Palpitations   . Sleep apnea    USES CPAP  . Tobacco abuse     Past Surgical History: Past Surgical History:  Procedure Laterality Date  . CORONARY ANGIOPLASTY    . IR CT HEAD LTD  04/21/2019  . IR PATIENT EVAL TECH 0-60 MINS  04/23/2019  . IR PERCUTANEOUS ART THROMBECTOMY/INFUSION INTRACRANIAL INC DIAG ANGIO  05/15/2019  . NO PAST SURGERIES    . RADIOLOGY WITH ANESTHESIA N/A 04/21/2019   Procedure: RADIOLOGY WITH ANESTHESIA;  Surgeon: Luanne Bras, MD;  Location: [facility];  Service: Radiology;  Laterality: N/A;  . RIGHT/LEFT HEART CATH AND CORONARY ANGIOGRAPHY N/A 10/27/2016   Procedure: RIGHT/LEFT HEART CATH AND CORONARY ANGIOGRAPHY;  Surgeon: Wellington Hampshire, MD;  Location: [facility];  Service: Cardiovascular;  Laterality: N/A;  . WISDOM TOOTH EXTRACTION  2008    Family History: Family History  Problem Relation Age of Onset  . Healthy Mother   . Heart failure Father        EF is 35%    Social History: Social History   Socioeconomic History  . Marital status: Married    Spouse name: Not on file  . Number of children: Not on file  . Years of education: Not on file  . Highest education level: Not on file  Occupational History  . 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 Smoking status: Current Every Day Smoker    Packs/day: 0.50    Types: Cigarettes  . Smokeless tobacco: Never Used  Substance and Sexual Activity  . Alcohol use: Yes  . Drug use: Not Currently    Types: Cocaine    Comment: quit 10/23/2016   . Sexual activity: Yes    Birth control/protection: None  Other Topics Concern  . Not on file  Social History Narrative   ** Merged History Encounter **       ** Merged History Encounter **   Lives in [place] w/ girlfriend.  He does not routinely  exercise.   Social Determinants of Health   Financial Resource Strain:   . Difficulty of Paying Living Expenses: Not on file  Food Insecurity:   . Worried About Charity fundraiser in the Last Year: Not on file  . Ran  Out of Food in the Last Year: Not on file  Transportation Needs:   . Lack of Transportation (Medical): Not on file  . Lack of Transportation (Non-Medical): Not on file  Physical Activity:   . Days of Exercise per Week: Not on file  . Minutes of Exercise per Session: Not on file  Stress:   . Feeling of Stress : Not on file  Social Connections:   . Frequency of Communication with Friends and Family: Not on file  . Frequency of Social Gatherings with Friends and Family: Not on file  . Attends Religious Services: Not on file  . Active Member of Clubs or Organizations: Not on file  . Attends Archivist Meetings: Not on file  . Marital Status: Not on file    Allergies:  Allergies  Allergen Reactions  . Heparin Other (See Comments)    Heparin induced thrombocytopenia. 2/15 HIT OD 1.692.     Objective:    Vital Signs:   Temp:  [98.9 F (37.2 C)-103 F (39.4 C)] 99.8 F (37.7 C) (02/19 0800) Pulse Rate:  [65-98] 82 (02/19 1441) Resp:  [14-26] 14 (02/19 1441) BP: (82-131)/(48-92) 131/71 (02/19 1441) SpO2:  [100 %] 100 % (02/19 1441) Arterial Line BP: (89-137)/(49-79) 95/49 (02/19 1400) FiO2 (%):  [40 %] 40 % (02/19 1441) Weight:  [114.2 kg] 114.2 kg (02/19 0500) Last BM Date: 05/05/19  Weight change: Filed Weights   05/04/19 0500 05/05/19 0500 05/06/19 0500  Weight: 120 kg 115.4 kg 114.2 kg    Intake/Output:   Intake/Output Summary (Last 24 hours) at 05/06/2019 1601 Last data filed at 05/06/2019 1400 Gross per 24 hour  Intake 1577.1 ml  Output 620 ml  Net 957.1 ml      Physical Exam    General:  Critically ill, on vent through trach, awake but not following commands HEENT: + TC, + Cor-trak  Neck: supple. JVP . Carotids 2+ bilat; no  bruits. No lymphadenopathy or thyromegaly appreciated.  Cor: PMI nondisplaced. Regular rate & rhythm. No rubs, gallops or murmurs. Lungs: clear, on vent through trach  Abdomen: obese, soft, nontender, nondistended. No hepatosplenomegaly. No bruits or masses. Good bowel sounds. Extremities: no cyanosis, clubbing, rash, edema Neuro: awake but not following commands   Telemetry   **  Labs   Basic Metabolic Panel: Recent Labs  Lab 05/02/19 0330 05/02/19 1655 05/03/19 0358 05/03/19 1645 05/04/19 0427 05/04/19 0427 05/04/19 1739 05/04/19 1808 05/05/19 0500 05/05/19 0500 05/05/19 0501 05/05/19 0848 05/05/19 0848 05/05/19 0850 05/05/19 1657 05/06/19 0500 05/06/19 0807 05/06/19 1156  NA 134*   < > 135   < > 134*   < > 134*   < > 134*  --    < > 139  --  136 137 134* 134*  --   K 4.1   < > 4.0   < > 4.6   < > 4.7   < > 4.6  --    < > 4.4   < > 4.8 5.3* 7.3* 6.4* 5.7*  CL 101   < > 102   < > 102   < > 101   < > 98  --    < > 97*  --  102 101 97* 99  --   CO2 20*   < > 22   < > 20*   < > 20*  --  19*  --   --   --   --   --  20* 20*  19*  --   GLUCOSE 207*   < > 175*   < > 138*   < > 155*   < > 209*  --    < > 200*  --  181* 82 171* 257*  --   BUN 62*   < > 62*   < > 59*   < > 71*   < > 63*  --    < > 42*  --  53* 79* 110* 119*  --   CREATININE 3.11*   < > 3.02*   < > 2.72*   < > 3.43*   < > 3.05*  --    < > 2.00*  --  3.00* 4.01* 6.23* 6.48*  --   CALCIUM 8.7*   < > 8.8*   < > 8.7*   < > 8.6*   < > 7.8*   < >  --   --   --   --  9.1 9.2 9.3  --   MG 3.1*  --  2.7*  --  2.7*  --   --   --  2.3  --   --   --   --   --   --  2.3  --   --   PHOS 4.8*   < > 4.2   < > 4.0  --  4.5  --  4.4  --   --   --   --   --  6.1* 7.2*  --   --    < > = values in this interval not displayed.    Liver Function Tests: Recent Labs  Lab 04/30/19 0437 04/30/19 1612 05/04/19 0453 05/04/19 1739 05/05/19 0500 05/05/19 1657 05/06/19 0500  AST 129*  --  57*  --   --   --   --   ALT 897*  --  192*   --   --   --   --   ALKPHOS 152*  --  117  --   --   --   --   BILITOT 1.8*  --  1.6*  --   --   --   --   PROT 7.0  --  7.4  --   --   --   --   ALBUMIN 2.8*  2.8*   < > 2.5* 2.4* 2.6* 2.5* 2.3*   < > = values in this interval not displayed.   No results for input(s): LIPASE, AMYLASE in the last 168 hours. No results for input(s): AMMONIA in the last 168 hours.  CBC: Recent Labs  Lab 05/02/19 0330 05/02/19 0330 05/03/19 0358 05/03/19 0358 05/04/19 0427 05/04/19 1808 05/05/19 0500 05/05/19 0501 05/05/19 0646 05/05/19 0647 05/05/19 0848 05/05/19 0850 05/06/19 0500  WBC 11.8*  --  9.4  --  9.9  --  16.3*  --   --   --   --   --  15.6*  HGB 12.4*   < > 11.3*   < > 11.1*   < > 11.2*   < > 11.6* 13.9 13.9 12.2* 9.7*  HCT 37.9*   < > 35.2*   < > 35.0*   < > 35.2*   < > 34.0* 41.0 41.0 36.0* 31.1*  MCV 95.2  --  96.2  --  95.6  --  96.7  --   --   --   --   --  97.2  PLT 49*  --  53*  --  65*  --  123*  --   --   --   --   --  165   < > = values in this interval not displayed.    Cardiac Enzymes: No results for input(s): CKTOTAL, CKMB, CKMBINDEX, TROPONINI in the last 168 hours.  BNP: BNP (last 3 results) Recent Labs    12/13/18 1750 12/15/18 0405 04/23/19 0945  BNP 1,156.1* 1,161.0* 2,909.9*    ProBNP (last 3 results) No results for input(s): PROBNP in the last 8760 hours.   CBG: Recent Labs  Lab 05/05/19 2326 05/06/19 0333 05/06/19 0813 05/06/19 1136 05/06/19 1551  GLUCAP 110* 148* 227* 165* 168*    Coagulation Studies: No results for input(s): LABPROT, INR in the last 72 hours.   Imaging    No results found.   Medications:     Current Medications: . sodium chloride   Intravenous Once  . amiodarone  200 mg Per Tube BID  . aspirin  81 mg Per Tube Daily  . atorvastatin  40 mg Per Tube q1800  . chlorhexidine gluconate (MEDLINE KIT)  15 mL Mouth Rinse BID  . Chlorhexidine Gluconate Cloth  6 each Topical Q0600  . feeding supplement (PRO-STAT  SUGAR FREE 64)  60 mL Per Tube TID  . fentaNYL (SUBLIMAZE) injection  50 mcg Intravenous Once  . insulin aspart  0-20 Units Subcutaneous Q4H  . insulin aspart  4 Units Subcutaneous Q4H  . insulin detemir  20 Units Subcutaneous Daily  . mouth rinse  15 mL Mouth Rinse 10 times per day  . pantoprazole sodium  40 mg Per Tube Q1200  . sodium chloride flush  10-40 mL Intracatheter Q12H  . sodium chloride flush  3 mL Intravenous Q12H     Infusions: . sodium chloride    . sodium chloride Stopped (05/04/19 0200)  . sodium chloride    . bivalirudin (ANGIOMAX) infusion 0.5 mg/mL (Non-ACS indications) 0.0543 mg/kg/hr (05/06/19 1400)  . calcium gluconate infusion for CRRT 20 g (05/06/19 1545)  . citrate dextrose 3,000 mL (05/06/19 1511)  . dexmedetomidine (PRECEDEX) IV infusion Stopped (05/06/19 1232)  . feeding supplement (VITAL AF 1.2 CAL) 50 mL/hr at 05/06/19 1400  . milrinone 0.25 mcg/kg/min (05/06/19 1400)  . norepinephrine (LEVOPHED) Adult infusion Stopped (05/06/19 1307)  . piperacillin-tazobactam Stopped (05/06/19 1118)  . prismasol B22GK 4/0    . prismasol B22GK 4/0 300 mL/hr at 05/06/19 1512  . prismasol B22GK 4/0 1,500 mL/hr at 05/06/19 1513  . [START ON 05/07/2019] vancomycin       Assessment/Plan   1. Acute on chronic systolic HF due to NICM-> cardiogenic shock - Echo EF < 20% - inotrope dependent 2. AKI -> ESRD - started on CRRT 04/23/19 3. Acute CVA with severe residual deficits due to extensive R cerebellar, midbrain, R MCA and L MCA infarcts  - likely cardioembolic - s/p clot extraction 4. Acute hypoxic respiratory failure - s/p trach  5. VT  Critically ill 34 y/o male w/ chronic systolic HF at baseline 2/2 NICM w/ EF 20%, admitted for acute MCA stroke, likely carioembolic from LV thrombus found on echo. Neurological recovery has been poor and w/ MSOF, on vent and requiring CRRT for renal failure. He is now CRRT dependent (olguric) and unable to wean off inotrope/  pressor support. Prognosis is very poor and with his severe HF, he will not be able to tolerate full HD.     Length of Stay: [address] PA-C  05/06/2019, 4:01 PM  Advanced Heart Failure  Team Pager [phone] (M-F; [place])  Please contact [facility] for night-coverage after hours (4p -7a ) and weekends on [email or web address]  Agree with above.   Unfortunate case. 34 y/o male with known severe NICM with systolic HF. EF < 05%. Admitted with extensive cerebral and cerebellar infarcts likely cardioembolic in nature. Underwent thrombectomy but has severe residual neurological deficits. Course complicated by sepsis and MSOF.   Now has VDRF with trach, on CRRT and inotrope dependent. NE weaned off and now on milrinone but co-ox drops every time milrinone is weaned.   On exam General:  Ill appearing. Seems to direct attention to voice but won't follow commands HEENT: normal Neck: supple. + trach  subclav TLC. Carotids 2+ bilat; no bruits. No lymphadenopathy or thryomegaly appreciated. Cor: PMI nondisplaced. Regular rate & rhythm. No rubs, gallops or murmurs. Lungs: coarse Abdomen: obese soft, nontender, nondistended. No hepatosplenomegaly. No bruits or masses. Good bowel sounds. Extremities: no cyanosis, clubbing, rash, edema multiple tattoos RFV Trialysis Neuro: Awake on vent. Seems to direct attention to voice but won't follow commands  I had long talk with his mother. Also d/w Dr. Elsworth Soho. Unfortunately I think options and prognosis are very poor. He obviously is not a candidate for advanced HF therapies.  Furthermore, given his degree of LV dysfunction and low output, I do not think he will tolerate intermittent HD for any significant length of time. Thus, without Renal recovery I think he has no options for survival outside of the ICU and I told his Mom this. In the interim, would continue support with milrinone to try and keep co-ox > 55% and use midodrine +/- NE to preserve BP and renal  perfusion to see if we can promote Renal recovery over the next week or so.   I will check in again with him on Monday. I wish I had better options for him and his family.   CRITICAL CARE Performed by: Glori Bickers  Total critical care time: 35 minutes  Critical care time was exclusive of separately billable procedures and treating other patients.  Critical care was necessary to treat or prevent imminent or life-threatening deterioration.  Critical care was time spent personally by me (independent of midlevel providers or residents) on the following activities: development of treatment plan with patient and/or surrogate as well as nursing, discussions with consultants, evaluation of patient's response to treatment, examination of patient, obtaining history from patient or surrogate, ordering and performing treatments and interventions, ordering and review of laboratory studies, ordering and review of radiographic studies, pulse oximetry and re-evaluation of patient's condition.  Glori Bickers, MD  6:43 PM

## 2019-05-06 NOTE — Progress Notes (Signed)
Assisted tele visit to patient with family member.  Carlos Schaper, RN.  

## 2019-05-06 NOTE — Progress Notes (Signed)
Patients hemodialysis catheter dressing saturated. Dr. Pearline Cables aware. Dressing removed, site cleaned, and quick clot placed with pressure dressing by Dr. Pearline Cables at [phone]. Verbal order to leave on for 24 hours from Dr. Pearline Cables. No signs of bleeding at this time. On coming shift notified of removal time. Lianne Bushy RN BSN.

## 2019-05-06 NOTE — TOC Initial Note (Signed)
Transition of Care (TOC) - Initial/Assessment Note    Patient Details  Name: Carlos Brewer. MRN: [id number] Date of Birth: 1985-12-14  Transition of Care [employer] [facility]) CM/SW Contact:    Ella Bodo, RN Phone Number: 05/06/2019, 10:03 AM  Clinical Narrative:   34 yo male smoker found to have slurred speech and Rt sided weakness.  CT head showed M2 occlusion.  Ultimately treated with thrombolytic and thrombectomy by IR.  Required intubation for airway protection. Course complicated by septic shock, AKI requiring CRRT and polymorphic VT.  [facility] notified of pt admission on 05/14/2019; spoke with April at [facility].  [place] contacted today to provide clinical update; again spoke with April.   Per April, pt's [place] PCP is at the [facility].  For discharge needs, we will need to contact [place] transfer coordinator RN to assist: Geannie Risen [phone], ext [phone] Or Huey Bienenstock [phone], ext [phone]               Expected Discharge Plan: Long Term Acute Care (LTAC) Barriers to Discharge: Continued Medical Work up   Patient Goals and CMS Choice        Expected Discharge Plan and Services Expected Discharge Plan: Long Term Acute Care (LTAC)                                              Prior Living Arrangements/Services                       Activities of Daily Living Home Assistive Devices/Equipment: CBG Meter, Blood pressure cuff ADL Screening (condition at time of admission) Patient's cognitive ability adequate to safely complete daily activities?: Yes Is the patient deaf or have difficulty hearing?: No Does the patient have difficulty seeing, even when wearing glasses/contacts?: Yes Does the patient have difficulty concentrating, remembering, or making decisions?: No Patient able to express need for assistance with ADLs?: Yes Does the patient have difficulty dressing or bathing?: No Independently performs ADLs?: Yes (appropriate for  developmental age) Does the patient have difficulty walking or climbing stairs?: No Weakness of Legs: None Weakness of Arms/Hands: None  Permission Sought/Granted                  Emotional Assessment              Admission diagnosis:  Stroke [facility]) [I63.9] Stroke (cerebrum) ([place]) [I63.9] Acute CVA (cerebrovascular accident) [facility]) [I63.9] Cerebrovascular accident (CVA) due to thrombosis of left middle cerebral artery ([place]) [I63.312] Middle cerebral artery embolism, left [I66.02] Patient Active Problem List   Diagnosis Date Noted  . Tracheostomy status ([place])   . Pressure injury of skin 05/02/2019  . Shock liver 04/23/2019  . Acute respiratory failure ([place]) 04/23/2019  . Occlusion of left middle cerebral artery 04/30/2019  . Middle cerebral artery embolism, left 04/21/2019  . Cerebrovascular accident (CVA) due to thrombosis of left middle cerebral artery ([place])   . Shock ([place])   . COVID-19 virus infection 12/14/2018  . COVID-19 12/13/2018  . Obesity (BMI 30-39.9)   . OSA on CPAP   . CKD (chronic kidney disease), stage II   . Polysubstance (excluding opioids) dependence ([place])   . Acute on chronic systolic CHF (congestive heart failure) ([place]) 07/12/2018  . Morbid obesity ([place]) 02/07/2017  . Nonischemic cardiomyopathy ([place]) 11/07/2016  . Chronic systolic heart failure (  [place]) 11/05/2016  . Anxiety 11/05/2016  . Substance use disorder 11/05/2016  . Pneumonia 10/24/2016   PCP:  Patient, No Pcp Per Pharmacy:   [facility] [place], [place] [address] Phone: [phone] Fax: [phone]     Social Determinants of Health (SDOH) Interventions    Readmission Risk Interventions No flowsheet data found.  Reinaldo Raddle, RN, BSN  Trauma/Neuro ICU Case Manager [phone]

## 2019-05-06 NOTE — Progress Notes (Signed)
[place] for Heparin >> Bivalirudin Indication: stroke 2/5, LV thrombus 2/8, HIT 2/15   Allergies  Allergen Reactions  . Heparin Other (See Comments)    Heparin induced thrombocytopenia. 2/15 HIT OD 1.692.     Patient Measurements: Height: 6\' 1"  (185.4 cm) Weight: 251 lb 12.3 oz (114.2 kg) IBW/kg (Calculated) : 79.9   Vital Signs: Temp: 99.8 F (37.7 C) (02/19 0800) Temp Source: Axillary (02/19 0800) BP: 122/77 (02/19 1022) Pulse Rate: 98 (02/19 1022)  Labs: Recent Labs    05/04/19 0427 05/04/19 1739 05/05/19 0500 05/05/19 0500 05/05/19 0501 05/05/19 0848 05/05/19 0848 05/05/19 0850 05/05/19 1657 05/05/19 1657 05/05/19 2300 05/06/19 0500 05/06/19 0807 05/06/19 1156  HGB 11.1*   < > 11.2*  --    < > 13.9   < > 12.2*  --   --   --  9.7*  --   --   HCT 35.0*   < > 35.2*  --    < > 41.0  --  36.0*  --   --   --  31.1*  --   --   PLT 65*  --  123*  --   --   --   --   --   --   --   --  165  --   --   APTT 57*  --  49*   < >  --   --   --   --  53*   < > 57* 62*  --  53*  CREATININE 2.72*   < > 3.05*   < >   < > 2.00*   < > 3.00* 4.01*  --   --  6.23* 6.48*  --    < > = values in this interval not displayed.    Estimated Creatinine Clearance: 21.3 mL/min (A) (by C-G formula based on SCr of 6.48 mg/dL (H)).  Assessment: 34 yr old male with left MCA infarct - received TPA and revascularization with IR on 2/5. On 2/8 found to have small LV apical thrombus on ECHO. Pharmacy consulted to dose IV heparin, which was switched to bivalirudin 2/15 for HIT.  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 SRA pending.    APTT therapeutic this AM and again after argatroban was resumed post dialysis catheter placement.  No bleeding reported.  Goal of Therapy:  APTT 50- 65 sec  Monitor platelets by anticoagulation protocol: Yes   Plan:  Continue bivalirudin at 0.055 mg/kg/hr (adjusted weight) Daily aPTT and  CBC F/u SRA  Janis Cuffe D. Mina Marble, PharmD, BCPS, [place] 05/06/2019, 1:08 PM

## 2019-05-06 NOTE — Progress Notes (Signed)
[place] for Heparin >> Bivalirudin Indication: stroke 2/5, LV thrombus 2/8, HIT 2/15    Assessment: 34 year old male with left MCA infarct - received TPA and revascularization with IR on 2/5. On 2/8 found to have small LV apical thrombus on ECHO. Pharmacy consulted to dose IV heparin, which was switched to bivalirudin on 2/15 for HIT.  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 SRA pending.    APTT 57 sec  Goal of Therapy:  APTT 50- 65 sec  Monitor platelets by anticoagulation protocol: Yes   Plan:  Cont bivalirudin at 0.055 mg/kg/hr (AdjBW, goal 50-65sec) Daily aPTT and CBC F/u SRA  Will need non-heparin anticoagulation for at least 3 months (= 07/24/19)  Thanks for allowing pharmacy to be a part of this patient's care.  Excell Seltzer, PharmD Clinical Pharmacist 05/06/2019, 12:41 AM

## 2019-05-06 NOTE — Progress Notes (Addendum)
eLink Physician-Brief Progress Note Patient Name: Carlos Brewer. DOB: [birth year] MRN: [id number]   Date of Service  05/06/2019  HPI/Events of Note  K+ 7.3  eICU Interventions  Stat EKG ordered, severe hyperkalemia Rx protocol ordered, PCCM on the ground requested to place a dialysis catheter for emergent dialysis, Nephrology on call stat paged for dialysis/ CRRT orders as appropriate.        Frederik Pear 05/06/2019, 6:37 AM

## 2019-05-06 NOTE — Procedures (Signed)
Hemodialysis Catheter Insertion Procedure Note Carlos Brewer [id number] [birth year]  Procedure: Insertion of Hemodialysis Catheter Indications: Dialysis Access   Procedure Details Consent: Unable to obtain consent because of altered level of consciousness. Time Out: Verified patient identification, verified procedure, site/side was marked, verified correct patient position, special equipment/implants available, medications/allergies/relevent history reviewed, required imaging and test results available.  Performed  Maximum sterile technique was used including antiseptics, cap, gloves, gown, hand hygiene, mask and sheet. Skin prep: Chlorhexidine; local anesthetic administered Double lumen hemodialysis catheter was inserted into right femoral vein due to patient being a dialysis patient using the Seldinger technique. Ultrasound guidance used.      Evaluation Blood flow good Complications: No apparent complications Patient did tolerate procedure well. Chest X-ray ordered to verify placement.  CXR: N/A.   Carlos Brewer Carlos Brewer 05/06/2019

## 2019-05-06 NOTE — Progress Notes (Signed)
SLP Note  Patient Details Name: Carlos Brewer. MRN: [id number] DOB: Jan 26, 1986   Cancelled treatment:       Reason Eval/Treat Not Completed: Other (comment) Patient with new tracheostomy on previous date. Orders for SLP eval and treat for PMSV received. Will follow pt closely for readiness for SLP interventions as appropriate.      Osie Bond., M.A. [place] [facility] Pager [phone] Office [phone]  05/06/2019, 11:11 AM

## 2019-05-06 NOTE — Progress Notes (Signed)
Pharmacy Antibiotic Note  Carlos Brewer. is a 34 y.o. male admitted on 05/13/2019 as a code stroke and completed a course of antibiotics for sepsis.   Now with a fever and leukocytosis, so Pharmacy has been consulted to restart vancomycin and Zosyn for sepsis.  Patient has been on CRRT, which was held yesterday for catheter placement this AM.  CRRT to resume today.  Tmax 102, WBC up 15.6.  Plan: Vanc 2gm IV x 1, then 1gm IV Q24H Zosyn 3.375gm IV Q6H, 30 min infusion Monitor CRRT tolerance/interruption, clinical progress, vanc level as indicated  Height: 6\' 1"  (185.4 cm) Weight: 251 lb 12.3 oz (114.2 kg) IBW/kg (Calculated) : 79.9  Temp (24hrs), Avg:100.7 F (38.2 C), Min:98 F (36.7 C), Max:103 F (39.4 C)  Recent Labs  Lab 05/02/19 0330 05/02/19 1655 05/03/19 0358 05/03/19 1645 05/04/19 0427 05/04/19 1739 05/05/19 0500 05/05/19 0501 05/05/19 0848 05/05/19 0850 05/05/19 1657 05/06/19 0500 05/06/19 0807  WBC 11.8*  --  9.4  --  9.9  --  16.3*  --   --   --   --  15.6*  --   CREATININE 3.11*   < > 3.02*   < > 2.72*   < > 3.05*   < > 2.00* 3.00* 4.01* 6.23* 6.48*   < > = values in this interval not displayed.    Estimated Creatinine Clearance: 21.3 mL/min (A) (by C-G formula based on SCr of 6.48 mg/dL (H)).    Allergies  Allergen Reactions  . Heparin Other (See Comments)    Heparin induced thrombocytopenia. 2/15 HIT OD 1.692.     Vanc 2/6>>2/9, restart 2/19 >> Zosyn 2/6x1, restart 2/19 >> Merrem 2/6 >> 2/12  2/5 covid / flu - negative 2/5 MRSA PCR - positive 2/6 UCx - negative 2/6 TA - negative 2/6 BCx - negative 2/16 TA - negative 2/19 BCx -   Chalonda Schlatter D. Mina Marble, PharmD, BCPS, [place] 05/06/2019, 9:47 AM

## 2019-05-06 NOTE — Progress Notes (Signed)
Patient ID: Carlos Brewer., male   DOB: March 14, 1986, 35 y.o.   MRN: [id number] S: developed hyperkalemia after being off of CVVHD for only 12 hours.  Had right femoral HD catheter placed this morning by PCCM. O:BP 122/77   Pulse 98   Temp 99.8 F (37.7 C) (Axillary)   Resp (!) 21   Ht 6' 1"  (1.854 m)   Wt 114.2 kg   SpO2 100%   BMI 33.22 kg/m   Intake/Output Summary (Last 24 hours) at 05/06/2019 1220 Last data filed at 05/06/2019 0900 Gross per 24 hour  Intake 1031.81 ml  Output 50 ml  Net 981.81 ml   Intake/Output: I/O last 3 completed shifts: In: 102 [I.V.:2731; NG/GT:750] Out: 3786 [Urine:50; YIRSW:5462; Stool:100]  Intake/Output this shift:  Total I/O In: 108.6 [I.V.:58.6; IV Piggyback:50] Out: -  Weight change: -1.2 kg Gen: intubated and sedated CVS: rrr Resp: occ rhonchi Abd: obese, +BS, soft, Nt/nd Ext: no edema, right femoral hd cath in place  Recent Labs  Lab 04/30/19 0437 04/30/19 1612 05/03/19 0358 05/03/19 0358 05/03/19 1645 05/03/19 1645 05/04/19 0427 05/04/19 0427 05/04/19 0453 05/04/19 1739 05/04/19 1808 05/05/19 0500 05/05/19 0501 05/05/19 7035 05/05/19 0093 05/05/19 0848 05/05/19 0850 05/05/19 1657 05/06/19 0500 05/06/19 0807  NA 135   < > 135   < > 134*   < > 134*   < >  --  134*   < > 134*   < > 136 138 139 136 137 134* 134*  K 4.0   < > 4.0   < > 4.2   < > 4.6   < >  --  4.7   < > 4.6   < > 4.7 4.2 4.4 4.8 5.3* 7.3* 6.4*  CL 100   < > 102   < > 102   < > 102   < >  --  101   < > 98   < > 100 98 97* 102 101 97* 99  CO2 21*   < > 22   < > 22  --  20*  --   --  20*  --  19*  --   --   --   --   --  20* 20* 19*  GLUCOSE 174*   < > 175*   < > 182*   < > 138*   < >  --  155*   < > 209*   < > 215* 223* 200* 181* 82 171* 257*  BUN 61*   < > 62*   < > 56*   < > 59*   < >  --  71*   < > 63*   < > 56* 44* 42* 53* 79* 110* 119*  CREATININE 3.02*   < > 3.02*   < > 2.85*   < > 2.72*   < >  --  3.43*   < > 3.05*   < > 3.00* 2.00* 2.00* 3.00*  4.01* 6.23* 6.48*  ALBUMIN 2.8*  2.8*   < > 2.4*   < > 2.5*  --  2.5*  --  2.5* 2.4*  --  2.6*  --   --   --   --   --  2.5* 2.3*  --   CALCIUM 8.9   < > 8.8*   < > 8.6*  --  8.7*  --   --  8.6*  --  7.8*  --   --   --   --   --  9.1 9.2 9.3  PHOS 4.8*  5.0*   < > 4.2  --  4.6  --  4.0  --   --  4.5  --  4.4  --   --   --   --   --  6.1* 7.2*  --   AST 129*  --   --   --   --   --   --   --  57*  --   --   --   --   --   --   --   --   --   --   --   ALT 897*  --   --   --   --   --   --   --  192*  --   --   --   --   --   --   --   --   --   --   --    < > = values in this interval not displayed.   Liver Function Tests: Recent Labs  Lab 04/30/19 0437 04/30/19 1612 05/04/19 0453 05/04/19 1739 05/05/19 0500 05/05/19 1657 05/06/19 0500  AST 129*  --  57*  --   --   --   --   ALT 897*  --  192*  --   --   --   --   ALKPHOS 152*  --  117  --   --   --   --   BILITOT 1.8*  --  1.6*  --   --   --   --   PROT 7.0  --  7.4  --   --   --   --   ALBUMIN 2.8*  2.8*   < > 2.5*   < > 2.6* 2.5* 2.3*   < > = values in this interval not displayed.   No results for input(s): LIPASE, AMYLASE in the last 168 hours. No results for input(s): AMMONIA in the last 168 hours. CBC: Recent Labs  Lab 05/02/19 0330 05/02/19 0330 05/03/19 0358 05/03/19 0358 05/04/19 0427 05/04/19 1808 05/05/19 0500 05/05/19 0501 05/05/19 0848 05/05/19 0850 05/06/19 0500  WBC 11.8*   < > 9.4   < > 9.9  --  16.3*  --   --   --  15.6*  HGB 12.4*   < > 11.3*   < > 11.1*   < > 11.2*   < > 13.9 12.2* 9.7*  HCT 37.9*   < > 35.2*   < > 35.0*   < > 35.2*   < > 41.0 36.0* 31.1*  MCV 95.2  --  96.2  --  95.6  --  96.7  --   --   --  97.2  PLT 49*   < > 53*   < > 65*  --  123*  --   --   --  165   < > = values in this interval not displayed.   Cardiac Enzymes: No results for input(s): CKTOTAL, CKMB, CKMBINDEX, TROPONINI in the last 168 hours. CBG: Recent Labs  Lab 05/05/19 1530 05/05/19 1929 05/05/19 2326  05/06/19 0333 05/06/19 1136  GLUCAP 82 77 110* 148* 165*    Iron Studies: No results for input(s): IRON, TIBC, TRANSFERRIN, FERRITIN in the last 72 hours. Studies/Results: DG Chest Port 1 View  Result Date: 05/05/2019 CLINICAL DATA:  Post tracheostomy. EXAM: PORTABLE CHEST 1 VIEW COMPARISON:  Chest radiograph 05/03/2019 FINDINGS: A  tracheostomy tube is now present with tip projecting just beneath the level of the clavicular heads. Unchanged position of a left IJ approach central venous catheter with tip projecting in the region of the upper SVC. A right subclavian approach central venous catheter is now present with tip projecting in the region of the lower SVC. A previously demonstrated right IJ approach central venous catheter has been removed. Overlying cardiac monitoring leads. Stable cardiomegaly. Patchy airspace disease throughout both lungs is overall similar as compared to prior exam with possible slight interval progression within the right upper to mid lung field. A left-sided pleural effusion is difficult to exclude. No evidence of pneumothorax. IMPRESSION: 1. Interval placement of a tracheostomy tube with tip projecting just beneath the level of the clavicular heads. 2. Additional support apparatus as described. 3. Patchy airspace disease throughout both lungs is overall similar as compared to prior exam 05/03/2019 with possible slight interval progression in the right upper to mid lung field. 4. Stable cardiomegaly. Electronically Signed   By: Kellie Simmering DO   On: 05/05/2019 13:29   . sodium chloride   Intravenous Once  . amiodarone  200 mg Per Tube BID  . aspirin  81 mg Per Tube Daily  . atorvastatin  40 mg Per Tube q1800  . chlorhexidine gluconate (MEDLINE KIT)  15 mL Mouth Rinse BID  . Chlorhexidine Gluconate Cloth  6 each Topical Q0600  . feeding supplement (PRO-STAT SUGAR FREE 64)  60 mL Per Tube TID  . fentaNYL (SUBLIMAZE) injection  50 mcg Intravenous Once  . insulin aspart   0-20 Units Subcutaneous Q4H  . insulin aspart  4 Units Subcutaneous Q4H  . insulin detemir  20 Units Subcutaneous Daily  . mouth rinse  15 mL Mouth Rinse 10 times per day  . pantoprazole sodium  40 mg Per Tube Q1200  . sodium chloride flush  10-40 mL Intracatheter Q12H  . sodium chloride flush  3 mL Intravenous Q12H    BMET    Component Value Date/Time   NA 134 (L) 05/06/2019 0807   K 6.4 (HH) 05/06/2019 0807   CL 99 05/06/2019 0807   CO2 19 (L) 05/06/2019 0807   GLUCOSE 257 (H) 05/06/2019 0807   BUN 119 (H) 05/06/2019 0807   CREATININE 6.48 (H) 05/06/2019 0807   CALCIUM 9.3 05/06/2019 0807   GFRNONAA 10 (L) 05/06/2019 0807   GFRAA 12 (L) 05/06/2019 0807   CBC    Component Value Date/Time   WBC 15.6 (H) 05/06/2019 0500   RBC 3.20 (L) 05/06/2019 0500   HGB 9.7 (L) 05/06/2019 0500   HCT 31.1 (L) 05/06/2019 0500   PLT 165 05/06/2019 0500   MCV 97.2 05/06/2019 0500   MCH 30.3 05/06/2019 0500   MCHC 31.2 05/06/2019 0500   RDW 14.4 05/06/2019 0500   LYMPHSABS 0.8 04/24/2019 0843   MONOABS 0.5 04/24/2019 0843   EOSABS 0.4 04/24/2019 0843   BASOSABS 0.0 04/24/2019 0843    Assessment/Plan:  1. AKIpresumably due to ATN with ischemic/nephrotoxic insults worsened by cardiogenic shock. Started on CRRT 04/23/19. Remains oliguric and dialysis dependentbut was able to come off of pressors last night. 1. Could not tolerate stopping CVVHD likely due to hypercatabolic state 2. Resumed CVVHD this morning due to hyperkalemia and will use 2K/2.5 Ca baths without calcium citrate for now (only option for dialysate without calcium was 4K so will likely need to change to citrate protocol if he clots system but wanted to drop potassium as fast as  possible) 2. Cardiogenic shock-currently back on levophed but likely due to sedation.   Discussed case with Dr. Haroldine Laws who will be seeing him later today, however given his hemodynamics, the prognosis is poor.  3. Acute stroke- presumed embolic  with bilateral infarcts and LV thrombus- received tpa on admission 4. Vascular access- right fem HD catheter placed 05/06/19 5. SVT/polymorphic VT- on amiodarone per Cardiology 6. Acute hypoxic respiratory failure- on vent and for trach soon if he remains off of pressors and platelets improve per PCCM 7. Thrombocytopenia- heparin discontinued. HIT panel ordered.Platelets improving. 8. Disposition- overall prognosis is poor given need for trach and ongoing HD (cannot tolerate stopping CVVHD due to hypercatabolic state) as well as cardiogenic shock/cardiomyopathy.  Recommend palliative care consult to help set goals/limits of care.  Donetta Potts, MD [employer] [phone]

## 2019-05-07 DIAGNOSIS — I639 Cerebral infarction, unspecified: Secondary | ICD-10-CM | POA: Diagnosis not present

## 2019-05-07 DIAGNOSIS — N179 Acute kidney failure, unspecified: Secondary | ICD-10-CM | POA: Diagnosis not present

## 2019-05-07 DIAGNOSIS — I63312 Cerebral infarction due to thrombosis of left middle cerebral artery: Secondary | ICD-10-CM | POA: Diagnosis not present

## 2019-05-07 DIAGNOSIS — G9340 Encephalopathy, unspecified: Secondary | ICD-10-CM | POA: Diagnosis not present

## 2019-05-07 DIAGNOSIS — Z930 Tracheostomy status: Secondary | ICD-10-CM | POA: Diagnosis not present

## 2019-05-07 DIAGNOSIS — R7401 Elevation of levels of liver transaminase levels: Secondary | ICD-10-CM

## 2019-05-07 DIAGNOSIS — J9601 Acute respiratory failure with hypoxia: Secondary | ICD-10-CM | POA: Diagnosis not present

## 2019-05-07 LAB — POCT I-STAT, CHEM 8
BUN: 100 mg/dL — ABNORMAL HIGH (ref 6–20)
BUN: 102 mg/dL — ABNORMAL HIGH (ref 6–20)
BUN: 108 mg/dL — ABNORMAL HIGH (ref 6–20)
BUN: 115 mg/dL — ABNORMAL HIGH (ref 6–20)
BUN: 48 mg/dL — ABNORMAL HIGH (ref 6–20)
BUN: 50 mg/dL — ABNORMAL HIGH (ref 6–20)
BUN: 52 mg/dL — ABNORMAL HIGH (ref 6–20)
BUN: 54 mg/dL — ABNORMAL HIGH (ref 6–20)
BUN: 59 mg/dL — ABNORMAL HIGH (ref 6–20)
BUN: 64 mg/dL — ABNORMAL HIGH (ref 6–20)
BUN: 67 mg/dL — ABNORMAL HIGH (ref 6–20)
BUN: 72 mg/dL — ABNORMAL HIGH (ref 6–20)
BUN: 74 mg/dL — ABNORMAL HIGH (ref 6–20)
BUN: 76 mg/dL — ABNORMAL HIGH (ref 6–20)
BUN: 80 mg/dL — ABNORMAL HIGH (ref 6–20)
BUN: 82 mg/dL — ABNORMAL HIGH (ref 6–20)
BUN: 84 mg/dL — ABNORMAL HIGH (ref 6–20)
BUN: 86 mg/dL — ABNORMAL HIGH (ref 6–20)
BUN: 88 mg/dL — ABNORMAL HIGH (ref 6–20)
BUN: 90 mg/dL — ABNORMAL HIGH (ref 6–20)
BUN: 92 mg/dL — ABNORMAL HIGH (ref 6–20)
BUN: 94 mg/dL — ABNORMAL HIGH (ref 6–20)
Calcium, Ion: 0.3 mmol/L — CL (ref 1.15–1.40)
Calcium, Ion: 0.31 mmol/L — CL (ref 1.15–1.40)
Calcium, Ion: 0.32 mmol/L — CL (ref 1.15–1.40)
Calcium, Ion: 0.32 mmol/L — CL (ref 1.15–1.40)
Calcium, Ion: 0.35 mmol/L — CL (ref 1.15–1.40)
Calcium, Ion: 0.35 mmol/L — CL (ref 1.15–1.40)
Calcium, Ion: 0.35 mmol/L — CL (ref 1.15–1.40)
Calcium, Ion: 0.38 mmol/L — CL (ref 1.15–1.40)
Calcium, Ion: 0.38 mmol/L — CL (ref 1.15–1.40)
Calcium, Ion: 0.4 mmol/L — CL (ref 1.15–1.40)
Calcium, Ion: 0.42 mmol/L — CL (ref 1.15–1.40)
Calcium, Ion: 0.5 mmol/L — CL (ref 1.15–1.40)
Calcium, Ion: 0.87 mmol/L — CL (ref 1.15–1.40)
Calcium, Ion: 0.88 mmol/L — CL (ref 1.15–1.40)
Calcium, Ion: 0.9 mmol/L — ABNORMAL LOW (ref 1.15–1.40)
Calcium, Ion: 0.93 mmol/L — ABNORMAL LOW (ref 1.15–1.40)
Calcium, Ion: 0.94 mmol/L — ABNORMAL LOW (ref 1.15–1.40)
Calcium, Ion: 0.95 mmol/L — ABNORMAL LOW (ref 1.15–1.40)
Calcium, Ion: 0.97 mmol/L — ABNORMAL LOW (ref 1.15–1.40)
Calcium, Ion: 0.98 mmol/L — ABNORMAL LOW (ref 1.15–1.40)
Calcium, Ion: 1.02 mmol/L — ABNORMAL LOW (ref 1.15–1.40)
Calcium, Ion: <0.3 mmol/L — CL (ref 1.15–1.40)
Chloride: 94 mmol/L — ABNORMAL LOW (ref 98–111)
Chloride: 94 mmol/L — ABNORMAL LOW (ref 98–111)
Chloride: 94 mmol/L — ABNORMAL LOW (ref 98–111)
Chloride: 94 mmol/L — ABNORMAL LOW (ref 98–111)
Chloride: 95 mmol/L — ABNORMAL LOW (ref 98–111)
Chloride: 95 mmol/L — ABNORMAL LOW (ref 98–111)
Chloride: 96 mmol/L — ABNORMAL LOW (ref 98–111)
Chloride: 96 mmol/L — ABNORMAL LOW (ref 98–111)
Chloride: 96 mmol/L — ABNORMAL LOW (ref 98–111)
Chloride: 96 mmol/L — ABNORMAL LOW (ref 98–111)
Chloride: 96 mmol/L — ABNORMAL LOW (ref 98–111)
Chloride: 96 mmol/L — ABNORMAL LOW (ref 98–111)
Chloride: 97 mmol/L — ABNORMAL LOW (ref 98–111)
Chloride: 97 mmol/L — ABNORMAL LOW (ref 98–111)
Chloride: 97 mmol/L — ABNORMAL LOW (ref 98–111)
Chloride: 97 mmol/L — ABNORMAL LOW (ref 98–111)
Chloride: 98 mmol/L (ref 98–111)
Chloride: 98 mmol/L (ref 98–111)
Chloride: 98 mmol/L (ref 98–111)
Chloride: 99 mmol/L (ref 98–111)
Chloride: 99 mmol/L (ref 98–111)
Chloride: 99 mmol/L (ref 98–111)
Creatinine, Ser: 3 mg/dL — ABNORMAL HIGH (ref 0.61–1.24)
Creatinine, Ser: 3.1 mg/dL — ABNORMAL HIGH (ref 0.61–1.24)
Creatinine, Ser: 3.1 mg/dL — ABNORMAL HIGH (ref 0.61–1.24)
Creatinine, Ser: 3.3 mg/dL — ABNORMAL HIGH (ref 0.61–1.24)
Creatinine, Ser: 3.4 mg/dL — ABNORMAL HIGH (ref 0.61–1.24)
Creatinine, Ser: 3.5 mg/dL — ABNORMAL HIGH (ref 0.61–1.24)
Creatinine, Ser: 3.6 mg/dL — ABNORMAL HIGH (ref 0.61–1.24)
Creatinine, Ser: 3.7 mg/dL — ABNORMAL HIGH (ref 0.61–1.24)
Creatinine, Ser: 3.7 mg/dL — ABNORMAL HIGH (ref 0.61–1.24)
Creatinine, Ser: 3.9 mg/dL — ABNORMAL HIGH (ref 0.61–1.24)
Creatinine, Ser: 4 mg/dL — ABNORMAL HIGH (ref 0.61–1.24)
Creatinine, Ser: 4.2 mg/dL — ABNORMAL HIGH (ref 0.61–1.24)
Creatinine, Ser: 4.2 mg/dL — ABNORMAL HIGH (ref 0.61–1.24)
Creatinine, Ser: 4.4 mg/dL — ABNORMAL HIGH (ref 0.61–1.24)
Creatinine, Ser: 4.5 mg/dL — ABNORMAL HIGH (ref 0.61–1.24)
Creatinine, Ser: 4.7 mg/dL — ABNORMAL HIGH (ref 0.61–1.24)
Creatinine, Ser: 4.7 mg/dL — ABNORMAL HIGH (ref 0.61–1.24)
Creatinine, Ser: 4.8 mg/dL — ABNORMAL HIGH (ref 0.61–1.24)
Creatinine, Ser: 5 mg/dL — ABNORMAL HIGH (ref 0.61–1.24)
Creatinine, Ser: 5.3 mg/dL — ABNORMAL HIGH (ref 0.61–1.24)
Creatinine, Ser: 5.4 mg/dL — ABNORMAL HIGH (ref 0.61–1.24)
Creatinine, Ser: 5.9 mg/dL — ABNORMAL HIGH (ref 0.61–1.24)
Glucose, Bld: 238 mg/dL — ABNORMAL HIGH (ref 70–99)
Glucose, Bld: 243 mg/dL — ABNORMAL HIGH (ref 70–99)
Glucose, Bld: 246 mg/dL — ABNORMAL HIGH (ref 70–99)
Glucose, Bld: 247 mg/dL — ABNORMAL HIGH (ref 70–99)
Glucose, Bld: 247 mg/dL — ABNORMAL HIGH (ref 70–99)
Glucose, Bld: 253 mg/dL — ABNORMAL HIGH (ref 70–99)
Glucose, Bld: 254 mg/dL — ABNORMAL HIGH (ref 70–99)
Glucose, Bld: 268 mg/dL — ABNORMAL HIGH (ref 70–99)
Glucose, Bld: 280 mg/dL — ABNORMAL HIGH (ref 70–99)
Glucose, Bld: 283 mg/dL — ABNORMAL HIGH (ref 70–99)
Glucose, Bld: 285 mg/dL — ABNORMAL HIGH (ref 70–99)
Glucose, Bld: 287 mg/dL — ABNORMAL HIGH (ref 70–99)
Glucose, Bld: 291 mg/dL — ABNORMAL HIGH (ref 70–99)
Glucose, Bld: 294 mg/dL — ABNORMAL HIGH (ref 70–99)
Glucose, Bld: 296 mg/dL — ABNORMAL HIGH (ref 70–99)
Glucose, Bld: 308 mg/dL — ABNORMAL HIGH (ref 70–99)
Glucose, Bld: 310 mg/dL — ABNORMAL HIGH (ref 70–99)
Glucose, Bld: 313 mg/dL — ABNORMAL HIGH (ref 70–99)
Glucose, Bld: 314 mg/dL — ABNORMAL HIGH (ref 70–99)
Glucose, Bld: 314 mg/dL — ABNORMAL HIGH (ref 70–99)
Glucose, Bld: 321 mg/dL — ABNORMAL HIGH (ref 70–99)
Glucose, Bld: 336 mg/dL — ABNORMAL HIGH (ref 70–99)
HCT: 24 % — ABNORMAL LOW (ref 39.0–52.0)
HCT: 25 % — ABNORMAL LOW (ref 39.0–52.0)
HCT: 26 % — ABNORMAL LOW (ref 39.0–52.0)
HCT: 26 % — ABNORMAL LOW (ref 39.0–52.0)
HCT: 27 % — ABNORMAL LOW (ref 39.0–52.0)
HCT: 28 % — ABNORMAL LOW (ref 39.0–52.0)
HCT: 28 % — ABNORMAL LOW (ref 39.0–52.0)
HCT: 28 % — ABNORMAL LOW (ref 39.0–52.0)
HCT: 28 % — ABNORMAL LOW (ref 39.0–52.0)
HCT: 28 % — ABNORMAL LOW (ref 39.0–52.0)
HCT: 28 % — ABNORMAL LOW (ref 39.0–52.0)
HCT: 29 % — ABNORMAL LOW (ref 39.0–52.0)
HCT: 29 % — ABNORMAL LOW (ref 39.0–52.0)
HCT: 30 % — ABNORMAL LOW (ref 39.0–52.0)
HCT: 30 % — ABNORMAL LOW (ref 39.0–52.0)
HCT: 30 % — ABNORMAL LOW (ref 39.0–52.0)
HCT: 30 % — ABNORMAL LOW (ref 39.0–52.0)
HCT: 32 % — ABNORMAL LOW (ref 39.0–52.0)
HCT: 32 % — ABNORMAL LOW (ref 39.0–52.0)
HCT: 32 % — ABNORMAL LOW (ref 39.0–52.0)
HCT: 36 % — ABNORMAL LOW (ref 39.0–52.0)
HCT: 51 % (ref 39.0–52.0)
Hemoglobin: 10.2 g/dL — ABNORMAL LOW (ref 13.0–17.0)
Hemoglobin: 10.2 g/dL — ABNORMAL LOW (ref 13.0–17.0)
Hemoglobin: 10.2 g/dL — ABNORMAL LOW (ref 13.0–17.0)
Hemoglobin: 10.2 g/dL — ABNORMAL LOW (ref 13.0–17.0)
Hemoglobin: 10.9 g/dL — ABNORMAL LOW (ref 13.0–17.0)
Hemoglobin: 10.9 g/dL — ABNORMAL LOW (ref 13.0–17.0)
Hemoglobin: 10.9 g/dL — ABNORMAL LOW (ref 13.0–17.0)
Hemoglobin: 12.2 g/dL — ABNORMAL LOW (ref 13.0–17.0)
Hemoglobin: 17.3 g/dL — ABNORMAL HIGH (ref 13.0–17.0)
Hemoglobin: 8.2 g/dL — ABNORMAL LOW (ref 13.0–17.0)
Hemoglobin: 8.5 g/dL — ABNORMAL LOW (ref 13.0–17.0)
Hemoglobin: 8.8 g/dL — ABNORMAL LOW (ref 13.0–17.0)
Hemoglobin: 8.8 g/dL — ABNORMAL LOW (ref 13.0–17.0)
Hemoglobin: 9.2 g/dL — ABNORMAL LOW (ref 13.0–17.0)
Hemoglobin: 9.5 g/dL — ABNORMAL LOW (ref 13.0–17.0)
Hemoglobin: 9.5 g/dL — ABNORMAL LOW (ref 13.0–17.0)
Hemoglobin: 9.5 g/dL — ABNORMAL LOW (ref 13.0–17.0)
Hemoglobin: 9.5 g/dL — ABNORMAL LOW (ref 13.0–17.0)
Hemoglobin: 9.5 g/dL — ABNORMAL LOW (ref 13.0–17.0)
Hemoglobin: 9.5 g/dL — ABNORMAL LOW (ref 13.0–17.0)
Hemoglobin: 9.9 g/dL — ABNORMAL LOW (ref 13.0–17.0)
Hemoglobin: 9.9 g/dL — ABNORMAL LOW (ref 13.0–17.0)
Potassium: 3.9 mmol/L (ref 3.5–5.1)
Potassium: 4 mmol/L (ref 3.5–5.1)
Potassium: 4 mmol/L (ref 3.5–5.1)
Potassium: 4.1 mmol/L (ref 3.5–5.1)
Potassium: 4.1 mmol/L (ref 3.5–5.1)
Potassium: 4.1 mmol/L (ref 3.5–5.1)
Potassium: 4.1 mmol/L (ref 3.5–5.1)
Potassium: 4.2 mmol/L (ref 3.5–5.1)
Potassium: 4.2 mmol/L (ref 3.5–5.1)
Potassium: 4.2 mmol/L (ref 3.5–5.1)
Potassium: 4.2 mmol/L (ref 3.5–5.1)
Potassium: 4.2 mmol/L (ref 3.5–5.1)
Potassium: 4.2 mmol/L (ref 3.5–5.1)
Potassium: 4.2 mmol/L (ref 3.5–5.1)
Potassium: 4.2 mmol/L (ref 3.5–5.1)
Potassium: 4.2 mmol/L (ref 3.5–5.1)
Potassium: 4.2 mmol/L (ref 3.5–5.1)
Potassium: 4.3 mmol/L (ref 3.5–5.1)
Potassium: 4.3 mmol/L (ref 3.5–5.1)
Potassium: 4.3 mmol/L (ref 3.5–5.1)
Potassium: 4.4 mmol/L (ref 3.5–5.1)
Potassium: 4.4 mmol/L (ref 3.5–5.1)
Sodium: 135 mmol/L (ref 135–145)
Sodium: 135 mmol/L (ref 135–145)
Sodium: 136 mmol/L (ref 135–145)
Sodium: 136 mmol/L (ref 135–145)
Sodium: 136 mmol/L (ref 135–145)
Sodium: 136 mmol/L (ref 135–145)
Sodium: 136 mmol/L (ref 135–145)
Sodium: 136 mmol/L (ref 135–145)
Sodium: 136 mmol/L (ref 135–145)
Sodium: 137 mmol/L (ref 135–145)
Sodium: 138 mmol/L (ref 135–145)
Sodium: 138 mmol/L (ref 135–145)
Sodium: 138 mmol/L (ref 135–145)
Sodium: 138 mmol/L (ref 135–145)
Sodium: 138 mmol/L (ref 135–145)
Sodium: 138 mmol/L (ref 135–145)
Sodium: 138 mmol/L (ref 135–145)
Sodium: 138 mmol/L (ref 135–145)
Sodium: 138 mmol/L (ref 135–145)
Sodium: 138 mmol/L (ref 135–145)
Sodium: 138 mmol/L (ref 135–145)
Sodium: 138 mmol/L (ref 135–145)
TCO2: 21 mmol/L — ABNORMAL LOW (ref 22–32)
TCO2: 21 mmol/L — ABNORMAL LOW (ref 22–32)
TCO2: 22 mmol/L (ref 22–32)
TCO2: 22 mmol/L (ref 22–32)
TCO2: 22 mmol/L (ref 22–32)
TCO2: 23 mmol/L (ref 22–32)
TCO2: 23 mmol/L (ref 22–32)
TCO2: 24 mmol/L (ref 22–32)
TCO2: 24 mmol/L (ref 22–32)
TCO2: 24 mmol/L (ref 22–32)
TCO2: 24 mmol/L (ref 22–32)
TCO2: 24 mmol/L (ref 22–32)
TCO2: 24 mmol/L (ref 22–32)
TCO2: 24 mmol/L (ref 22–32)
TCO2: 25 mmol/L (ref 22–32)
TCO2: 25 mmol/L (ref 22–32)
TCO2: 26 mmol/L (ref 22–32)
TCO2: 26 mmol/L (ref 22–32)
TCO2: 26 mmol/L (ref 22–32)
TCO2: 26 mmol/L (ref 22–32)
TCO2: 27 mmol/L (ref 22–32)
TCO2: 28 mmol/L (ref 22–32)

## 2019-05-07 LAB — RENAL FUNCTION PANEL
Albumin: 2.1 g/dL — ABNORMAL LOW (ref 3.5–5.0)
Albumin: 2.3 g/dL — ABNORMAL LOW (ref 3.5–5.0)
Anion gap: 17 — ABNORMAL HIGH (ref 5–15)
Anion gap: 16 — ABNORMAL HIGH (ref 5–15)
BUN: 86 mg/dL — ABNORMAL HIGH (ref 6–20)
BUN: 64 mg/dL — ABNORMAL HIGH (ref 6–20)
CO2: 23 mmol/L (ref 22–32)
CO2: 24 mmol/L (ref 22–32)
Calcium: 7.9 mg/dL — ABNORMAL LOW (ref 8.9–10.3)
Calcium: 8.5 mg/dL — ABNORMAL LOW (ref 8.9–10.3)
Chloride: 94 mmol/L — ABNORMAL LOW (ref 98–111)
Chloride: 94 mmol/L — ABNORMAL LOW (ref 98–111)
Creatinine, Ser: 4.36 mg/dL — ABNORMAL HIGH (ref 0.61–1.24)
Creatinine, Ser: 3.77 mg/dL — ABNORMAL HIGH (ref 0.61–1.24)
GFR calc Af Amer: 19 mL/min — ABNORMAL LOW (ref >60)
GFR calc Af Amer: 23 mL/min — ABNORMAL LOW (ref >60)
GFR calc non Af Amer: 16 mL/min — ABNORMAL LOW (ref >60)
GFR calc non Af Amer: 20 mL/min — ABNORMAL LOW (ref >60)
Glucose, Bld: 304 mg/dL — ABNORMAL HIGH (ref 70–99)
Glucose, Bld: 280 mg/dL — ABNORMAL HIGH (ref 70–99)
Phosphorus: 4.5 mg/dL (ref 2.5–4.6)
Phosphorus: 4.2 mg/dL (ref 2.5–4.6)
Potassium: 4.3 mmol/L (ref 3.5–5.1)
Potassium: 4.2 mmol/L (ref 3.5–5.1)
Sodium: 134 mmol/L — ABNORMAL LOW (ref 135–145)
Sodium: 134 mmol/L — ABNORMAL LOW (ref 135–145)

## 2019-05-07 LAB — BASIC METABOLIC PANEL
Anion gap: 17 — ABNORMAL HIGH (ref 5–15)
BUN: 94 mg/dL — ABNORMAL HIGH (ref 6–20)
CO2: 22 mmol/L (ref 22–32)
Calcium: 7.8 mg/dL — ABNORMAL LOW (ref 8.9–10.3)
Chloride: 97 mmol/L — ABNORMAL LOW (ref 98–111)
Creatinine, Ser: 4.78 mg/dL — ABNORMAL HIGH (ref 0.61–1.24)
GFR calc Af Amer: 17 mL/min — ABNORMAL LOW (ref >60)
GFR calc non Af Amer: 15 mL/min — ABNORMAL LOW (ref >60)
Glucose, Bld: 253 mg/dL — ABNORMAL HIGH (ref 70–99)
Potassium: 4.2 mmol/L (ref 3.5–5.1)
Sodium: 136 mmol/L (ref 135–145)

## 2019-05-07 LAB — CBC
HCT: 26.4 % — ABNORMAL LOW (ref 39.0–52.0)
Hemoglobin: 8.4 g/dL — ABNORMAL LOW (ref 13.0–17.0)
MCH: 30.3 pg (ref 26.0–34.0)
MCHC: 31.8 g/dL (ref 30.0–36.0)
MCV: 95.3 fL (ref 80.0–100.0)
Platelets: 194 10*3/uL (ref 150–400)
RBC: 2.77 MIL/uL — ABNORMAL LOW (ref 4.22–5.81)
RDW: 13.7 % (ref 11.5–15.5)
WBC: 14 10*3/uL — ABNORMAL HIGH (ref 4.0–10.5)
nRBC: 0 % (ref 0.0–0.2)

## 2019-05-07 LAB — GLUCOSE, CAPILLARY
Glucose-Capillary: 264 mg/dL — ABNORMAL HIGH (ref 70–99)
Glucose-Capillary: 300 mg/dL — ABNORMAL HIGH (ref 70–99)
Glucose-Capillary: 321 mg/dL — ABNORMAL HIGH (ref 70–99)
Glucose-Capillary: 253 mg/dL — ABNORMAL HIGH (ref 70–99)
Glucose-Capillary: 278 mg/dL — ABNORMAL HIGH (ref 70–99)
Glucose-Capillary: 319 mg/dL — ABNORMAL HIGH (ref 70–99)

## 2019-05-07 LAB — APTT: aPTT: 56 seconds — ABNORMAL HIGH (ref 24–36)

## 2019-05-07 LAB — MAGNESIUM: Magnesium: 2.2 mg/dL (ref 1.7–2.4)

## 2019-05-07 MED ORDER — "THROMBI-PAD 3""X3"" EX PADS"
1.0000 | MEDICATED_PAD | Freq: Once | CUTANEOUS | Status: AC
  Administered 2019-05-07: 1 via TOPICAL
  Filled 2019-05-07: qty 1

## 2019-05-07 MED ORDER — INSULIN DETEMIR 100 UNIT/ML ~~LOC~~ SOLN
25.0000 [IU] | Freq: Every day | SUBCUTANEOUS | Status: DC
  Administered 2019-05-08 – 2019-05-09 (×2): 25 [IU] via SUBCUTANEOUS
  Filled 2019-05-07 (×2): qty 0.25

## 2019-05-07 MED ORDER — AMIODARONE IV BOLUS ONLY 150 MG/100ML
150.0000 mg | Freq: Once | INTRAVENOUS | Status: AC
  Administered 2019-05-07: 150 mg via INTRAVENOUS
  Filled 2019-05-07: qty 100

## 2019-05-07 MED ORDER — VASOPRESSIN 20 UNIT/ML IV SOLN
0.0300 [IU]/min | INTRAVENOUS | Status: DC
  Administered 2019-05-07: 03:00:00 0.03 [IU]/min via INTRAVENOUS
  Filled 2019-05-07 (×2): qty 2

## 2019-05-07 NOTE — Progress Notes (Signed)
Patient ID: Carlos Brewer., male   DOB: [birth year], 34 y.o.   MRN: [id number] S: Pt developed a fib with RVR and levophed stopped and started on vasopressin for hypotension.  Also started on amiodarone due to wide complex tachycardia. O:BP 124/82   Pulse (!) 101   Temp 97.8 F (36.6 C) (Oral)   Resp (!) 25   Ht 6' 1"  (1.854 m)   Wt 115.4 kg   SpO2 97%   BMI 33.57 kg/m   Intake/Output Summary (Last 24 hours) at 05/07/2019 1120 Last data filed at 05/07/2019 1100 Gross per 24 hour  Intake 11733.77 ml  Output 4482 ml  Net 7251.77 ml   Intake/Output: I/O last 3 completed shifts: In: 11665.4 [I.V.:5796.5; NG/GT:935; IV Piggyback:4933.9] Out: 3244 [Urine:50; Other:3371]  Intake/Output this shift:  Total I/O In: 893.2 [I.V.:477.9; NG/GT:250; IV Piggyback:165.3] Out: 1111 [Other:1111] Weight change: 1.2 kg Gen: on vent via trach CVS: tachy Resp: occ rhonchi Abd:+BS, soft, NT Ext: no edema  Recent Labs  Lab 05/04/19 0427 05/04/19 0427 05/04/19 0453 05/04/19 1739 05/04/19 1808 05/05/19 0500 05/05/19 0501 05/05/19 1657 05/05/19 1657 05/06/19 0500 05/06/19 0500 05/06/19 0807 05/06/19 1156 05/06/19 1558 05/06/19 1559 05/07/19 0246 05/07/19 0343 05/07/19 0347 05/07/19 0541 05/07/19 0546 05/07/19 0555 05/07/19 0630 05/07/19 0821 05/07/19 0826  NA 134*   < >  --  134*   < > 134*   < > 137   < > 134*   < > 134*   < > 135  135   < > 136   < > 135 138 138 135 134* 136 138  K 4.6   < >  --  4.7   < > 4.6   < > 5.3*   < > 7.3*   < > 6.4*   < > 4.6  4.6   < > 4.2   < > 4.3 4.2 4.1 4.4 4.3 4.2 4.2  CL 102   < >  --  101   < > 98   < > 101   < > 97*   < > 99   < > 99  100   < > 97*   < > 98 94* 96* 96* 94* 96* 94*  CO2 20*   < >  --  20*   < > 19*  --  20*  --  20*  --  19*  --  19*  --  22  --   --   --   --   --  23  --   --   GLUCOSE 138*   < >  --  155*   < > 209*   < > 82   < > 171*   < > 257*   < > 222*  225*   < > 253*   < > 287* 314* 313* 310* 304* 314* 321*   BUN 59*   < >  --  71*   < > 63*   < > 79*   < > 110*   < > 119*   < > 121*  115*   < > 94*   < > 86* 74* 67* 90* 86* 76* 72*  CREATININE 2.72*   < >  --  3.43*   < > 3.05*   < > 4.01*   < > 6.23*   < > 6.48*   < > 6.30*  6.07*   < > 4.78*   < > 4.50*  3.50* 3.60* 4.70* 4.36* 4.20* 3.40*  ALBUMIN 2.5*   < > 2.5* 2.4*  --  2.6*  --  2.5*  --  2.3*  --   --   --  2.0*  --   --   --   --   --   --   --  2.1*  --   --   CALCIUM 8.7*   < >  --  8.6*   < > 7.8*  --  9.1  --  9.2  --  9.3  --  8.3*  --  7.8*  --   --   --   --   --  7.9*  --   --   PHOS 4.0  --   --  4.5  --  4.4  --  6.1*  --  7.2*  --   --   --  8.1*  --   --   --   --   --   --   --  4.5  --   --   AST  --   --  57*  --   --   --   --   --   --   --   --   --   --   --   --   --   --   --   --   --   --   --   --   --   ALT  --   --  192*  --   --   --   --   --   --   --   --   --   --   --   --   --   --   --   --   --   --   --   --   --    < > = values in this interval not displayed.   Liver Function Tests: Recent Labs  Lab 05/04/19 0453 05/04/19 1739 05/06/19 0500 05/06/19 1558 05/07/19 0630  AST 57*  --   --   --   --   ALT 192*  --   --   --   --   ALKPHOS 117  --   --   --   --   BILITOT 1.6*  --   --   --   --   PROT 7.4  --   --   --   --   ALBUMIN 2.5*   < > 2.3* 2.0* 2.1*   < > = values in this interval not displayed.   No results for input(s): LIPASE, AMYLASE in the last 168 hours. No results for input(s): AMMONIA in the last 168 hours. CBC: Recent Labs  Lab 05/03/19 0358 05/03/19 0358 05/04/19 0427 05/04/19 1808 05/05/19 0500 05/05/19 0501 05/06/19 0500 05/06/19 1558 05/07/19 0246 05/07/19 0343 05/07/19 0555 05/07/19 0821 05/07/19 0826  WBC 9.4   < > 9.9  --  16.3*  --  15.6*  --  14.0*  --   --   --   --   HGB 11.3*   < > 11.1*   < > 11.2*   < > 9.7*   < > 8.4*   < > 8.5* 9.5* 9.9*  HCT 35.2*   < > 35.0*   < > 35.2*   < > 31.1*   < > 26.4*   < > 25.0* 28.0* 29.0*  MCV 96.2  --  95.6  --   96.7  --  97.2  --  95.3  --   --   --   --   PLT 53*   < > 65*  --  123*  --  165  --  194  --   --   --   --    < > = values in this interval not displayed.   Cardiac Enzymes: No results for input(s): CKTOTAL, CKMB, CKMBINDEX, TROPONINI in the last 168 hours. CBG: Recent Labs  Lab 05/06/19 1551 05/06/19 1928 05/06/19 2309 05/07/19 0328 05/07/19 0818  GLUCAP 168* 200* 200* 264* 300*    Iron Studies: No results for input(s): IRON, TIBC, TRANSFERRIN, FERRITIN in the last 72 hours. Studies/Results: DG Chest Port 1 View  Result Date: 05/05/2019 CLINICAL DATA:  Post tracheostomy. EXAM: PORTABLE CHEST 1 VIEW COMPARISON:  Chest radiograph 05/03/2019 FINDINGS: A tracheostomy tube is now present with tip projecting just beneath the level of the clavicular heads. Unchanged position of a left IJ approach central venous catheter with tip projecting in the region of the upper SVC. A right subclavian approach central venous catheter is now present with tip projecting in the region of the lower SVC. A previously demonstrated right IJ approach central venous catheter has been removed. Overlying cardiac monitoring leads. Stable cardiomegaly. Patchy airspace disease throughout both lungs is overall similar as compared to prior exam with possible slight interval progression within the right upper to mid lung field. A left-sided pleural effusion is difficult to exclude. No evidence of pneumothorax. IMPRESSION: 1. Interval placement of a tracheostomy tube with tip projecting just beneath the level of the clavicular heads. 2. Additional support apparatus as described. 3. Patchy airspace disease throughout both lungs is overall similar as compared to prior exam 05/03/2019 with possible slight interval progression in the right upper to mid lung field. 4. Stable cardiomegaly. Electronically Signed   By: Kellie Simmering DO   On: 05/05/2019 13:29   . sodium chloride   Intravenous Once  . amiodarone  200 mg Per Tube BID   . aspirin  81 mg Per Tube Daily  . atorvastatin  40 mg Per Tube q1800  . chlorhexidine gluconate (MEDLINE KIT)  15 mL Mouth Rinse BID  . Chlorhexidine Gluconate Cloth  6 each Topical Q0600  . feeding supplement (PRO-STAT SUGAR FREE 64)  60 mL Per Tube TID  . fentaNYL (SUBLIMAZE) injection  50 mcg Intravenous Once  . insulin aspart  0-20 Units Subcutaneous Q4H  . insulin aspart  4 Units Subcutaneous Q4H  . [START ON 05/08/2019] insulin detemir  25 Units Subcutaneous Daily  . mouth rinse  15 mL Mouth Rinse 10 times per day  . pantoprazole sodium  40 mg Per Tube Q1200  . sodium chloride flush  10-40 mL Intracatheter Q12H  . sodium chloride flush  3 mL Intravenous Q12H    BMET    Component Value Date/Time   NA 138 05/07/2019 0826   K 4.2 05/07/2019 0826   CL 94 (L) 05/07/2019 0826   CO2 23 05/07/2019 0630   GLUCOSE 321 (H) 05/07/2019 0826   BUN 72 (H) 05/07/2019 0826   CREATININE 3.40 (H) 05/07/2019 0826   CALCIUM 7.9 (L) 05/07/2019 0630   GFRNONAA 16 (L) 05/07/2019 0630   GFRAA 19 (L) 05/07/2019 0630   CBC    Component Value Date/Time   WBC 14.0 (H) 05/07/2019 0246   RBC 2.77 (L) 05/07/2019 0246  HGB 9.9 (L) 05/07/2019 0826   HCT 29.0 (L) 05/07/2019 0826   PLT 194 05/07/2019 0246   MCV 95.3 05/07/2019 0246   MCH 30.3 05/07/2019 0246   MCHC 31.8 05/07/2019 0246   RDW 13.7 05/07/2019 0246   LYMPHSABS 0.8 04/24/2019 0843   MONOABS 0.5 04/24/2019 0843   EOSABS 0.4 04/24/2019 0843   BASOSABS 0.0 04/24/2019 0843   Assessment/Plan:  1. AKIpresumably due to ATN with ischemic/nephrotoxic insults worsened by cardiogenic shock. Started on CRRT 04/23/19. Remains oliguric and dialysis dependentbut was able to come off of pressors last night. 1. Could not tolerate stopping CVVHD likely due to hypercatabolic state 2. Resumed CVVHD 2/19/21due to hyperkalemia which has resolved 3. Using 4K/0Ca baths for all fluids due to use of citrate 2. Cardiogenic shock-currentlyon  vasopressin and milrinone due to development of a fib with rvr and wide complex tachycardia.   1. Discussed case with Dr. Haroldine Laws who felt that he has a known severe NICM with EF <20% and did not feel that he would tolerate IHD but would continue to support with milrinone and midodrine/pressors to help promote renal perfusion.  If not would recommend transition to comfort care as his prognosis remains poor.  3. Acute stroke- presumed embolic with bilateral infarcts and LV thrombus- received tpa on admission 4. Vascular access- right fem HD catheter placed 05/06/19 5. SVT/polymorphic VT- on amiodarone per Cardiology 6. Acute hypoxic respiratory failure- on vent and for trach soon if he remains off of pressors and platelets improve per PCCM 7. Thrombocytopenia- heparin discontinued. HIT panel ordered.Platelets improving. 8. Disposition- overall prognosis is poor given need for trach and ongoing HD (cannot tolerate stopping CVVHD due to hypercatabolic state) as well as cardiogenic shock/cardiomyopathy.  Recommend palliative care consult to help set goals/limits of care.  Donetta Potts, MD [employer] [phone]

## 2019-05-07 NOTE — Progress Notes (Signed)
eLink Physician-Brief Progress Note Patient Name: Carlos Brewer. DOB: 19-Feb-1986 MRN: [id number]   Date of Service  05/07/2019  HPI/Events of Note  Pt developed  ? Atrial Fibrillation with RVR while on Levophed infusion, Pt has a history of polymorphic VT and had a K+ of 7.2 this a.m.  eICU Interventions  Discontinue Levophed due to arrythmogenic potential, substitute Vasopressin for hypotension, check BMET, Mg++, EKG.        Kerry Kass Roxann Vierra 05/07/2019, 2:08 AM

## 2019-05-07 NOTE — Progress Notes (Signed)
ANTICOAGULATION CONSULT NOTE  Pharmacy Consult:  Bivalirudin Indication: stroke 2/5, LV thrombus 2/8, HIT 2/15   Allergies  Allergen Reactions  . Heparin Other (See Comments)    Heparin induced thrombocytopenia. 2/15 HIT OD 1.692.     Patient Measurements: Height: 6\' 1"  (185.4 cm) Weight: 254 lb 6.6 oz (115.4 kg) IBW/kg (Calculated) : 79.9   Vital Signs: Temp: 97.8 F (36.6 C) (02/20 0400) Temp Source: Oral (02/20 0400) BP: 135/91 (02/20 0800) Pulse Rate: 103 (02/20 0900)  Labs: Recent Labs    05/05/19 0500 05/05/19 0501 05/06/19 0500 05/06/19 0807 05/06/19 1156 05/06/19 1558 05/07/19 0246 05/07/19 0343 05/07/19 0555 05/07/19 0555 05/07/19 0630 05/07/19 0821 05/07/19 0826  HGB 11.2*   < > 9.7*  --   --    < > 8.4*   < > 8.5*   < >  --  9.5* 9.9*  HCT 35.2*   < > 31.1*  --   --    < > 26.4*   < > 25.0*  --   --  28.0* 29.0*  PLT 123*  --  165  --   --   --  194  --   --   --   --   --   --   APTT 49*   < > 62*  --  53*  --  56*  --   --   --   --   --   --   CREATININE 3.05*   < > 6.23*   < >  --    < > 4.78*   < > 4.70*   < > 4.36* 4.20* 3.40*   < > = values in this interval not displayed.    Estimated Creatinine Clearance: 40.7 mL/min (A) (by C-G formula based on SCr of 3.4 mg/dL (H)).  Assessment: 34 yr old male with left MCA infarct - received TPA and revascularization with IR on 2/5. On 2/8 found to have small LV apical thrombus on ECHO. Pharmacy consulted to dose IV heparin, which was switched to bivalirudin 2/15 for HIT.  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 SRA pending.    APTT therapeutic; no bleeding reported.  Goal of Therapy:  APTT 50- 65 sec  Monitor platelets by anticoagulation protocol: Yes   Plan:  Continue bivalirudin at 0.055 mg/kg/hr (AdjBW) Daily aPTT and CBC F/u SRA  Mirakle Tomlin D. Mina Marble, PharmD, BCPS, [place] 05/07/2019, 9:58 AM

## 2019-05-07 NOTE — Progress Notes (Signed)
Assisted tele visit to patient with family member.  Shilee Biggs P, RN  

## 2019-05-07 NOTE — Progress Notes (Signed)
eLink Physician-Brief Progress Note Patient Name: Carlos Brewer. DOB: 03/04/1986 MRN: [id number]   Date of Service  05/07/2019  HPI/Events of Note  Wide complex tachycardia - junctional?  eICU Interventions  Will treat with a 150 mg iv Amiodarone bolus, the concern being that the rhythm if left untreated, could degenerate into a more lethal rhythm such as polymorphic VT, which he has a history of.        Kerry Kass Shital Crayton 05/07/2019, 2:28 AM

## 2019-05-07 NOTE — Progress Notes (Signed)
NAME:  Carlos Kunath., MRN:  [id number], DOB:  1985-08-02, LOS: 23 ADMISSION DATE:  05/14/2019, CONSULTATION DATE:  05/14/2019 REFERRING MD:  Dr. Leonel Ramsay, CHIEF COMPLAINT:  Slurred speech  Brief History   34 yo male smoker found to have slurred speech and Rt sided weakness.  CT head showed M2 occlusion.  Ultimately treated with thrombolytic and thrombectomy by IR.  Required intubation for airway protection. Course complicated by septic shock, AKI requiring CRRT and polymorphic VT  Past Medical History  Systolic CHF with non ischemic CM, Cocaine abuse, OSA, DM Hx of CHF (EF 15%)  Hx myocarditis Smoker 1/2 ppd   Significant Hospital Events   2/05 Admit, tPA, IR thrombectomy 2/6 100% on fvent at 2300  Overnight requiring increasing levophed and phenylephrine.    2/6: switched to levophed, epi, started antibiotics, started on CRRT.  2/9  developed wide-complex tachycardia and hypotension, started on amiodarone drip, increase Levophed drip 2/15 drop in platelets, heparin stopped, bival started 2/17 Hypotensive and back on Levophed 2/18 trach, lines changed 2/19 started milrinone , fever 103   Consults:  Neuro IR  Procedures:  ETT 2/05 >> 2/18 [facility] CVL 2/5 >>2/18  RT [place] CVL 2/18 >> RIJ HD cath 2/6 >>2/18  RT fem HD 2/19 >>  2/5-2/6:  TPA given at 428 am (total of 90 Mg) 520 am went to IR  S/P Lt common carotid arteriogram followed by complete revascularization of occluded LT MCA sup division mid M2 seg with x 1 pass with 93mx 40 mm solitaire X ret river device and penumbra aspiration with TICI 3 revascularization   Significant Diagnostic Tests:  CT angio head/neck 2/05 >> occlusion of Lt MCA bifurcation Echo 2/05 >> EF less than 20%, cannot rule out apical thrombus MRI brain 2/11 > extensive acute infarction of multiple areas without large or medium vessel occlusion Echocardiogram 2/8 Left ventricular ejection fraction, by estimation, is <20%. The left    ventricle has severely decreased function. The left ventricle demonstrates  global hypokinesis. The left ventricular internal cavity size was severely  dilated. Possible small 0.8 x  0.6 cm apical thrombus. Somewhat subtle finding, could confirm with  cardiac MRI. However, if patient has had CVA, would be reasonable to  anticoagulate given severity of LV dysfunction if no contraindications to  anticoagulation.   Micro Data:  SARS CoV2 PCR 2/05 >> negative Influenza PCR 2/05 >> negative Urine 2/6 >> ng resp 2/6 >> nml flora BC 2/6 >>ng resp 2/16 >> [facility] >>  Antimicrobials:  mero 2/6 -2/12 Zosyn 2/6 , 2/19 >> vanc 2/6 >> 2/9 , 2/19 >>  Interim history/subjective:   Episode of wide-complex tachycardia yesterday, changed from Levophed to vasopressin Remains on CRRT, attempted negative balance Has defervesced Remains anuric Less agitation on low-dose Precedex   Objective   Blood pressure 124/82, pulse 100, temperature 97.8 F (36.6 C), temperature source Oral, resp. rate (!) 22, height 6' 1"  (1.854 m), weight 115.4 kg, SpO2 97 %.    Vent Mode: PSV;CPAP FiO2 (%):  [40 %] 40 % Set Rate:  [24 bmp] 24 bmp Vt Set:  [630 mL] 630 mL PEEP:  [5 cmH20] 5 cmH20 Pressure Support:  [10 cmH20] 10 cmH20 Plateau Pressure:  [19 cmH20-24 cmH20] 19 cmH20   Intake/Output Summary (Last 24 hours) at 05/07/2019 1044 Last data filed at 05/07/2019 1000 Gross per 24 hour  Intake 11525.19 ml  Output 4023 ml  Net 7502.19 ml   FAutoliv  05/05/19 0500 05/06/19 0500 05/07/19 0500  Weight: 115.4 kg 114.2 kg 115.4 kg    Examination:  General -young well-built man, intubated, in no distress Eyes -pupils reactive , mild pallor, no icterus ENT -tracheostomy site, no bleeding Cardiac -S1-S2 regular, no murmur Chest - bilateral ventilated breath sounds, mild blood-tinged secretions Abdomen - soft, non tender, + bowel sounds Extremities - no cyanosis, clubbing, or edema Skin - no  rashes Neuro -RASS 0  awake, tracks but does not follow commands, yawning and chewing movements   Chest x-ray 2/18 personally reviewed -patchy opacity bilateral slight progression right upper  Labs show mild leukocytosis, hyperkalemia resolved, high sugars   Resolved Hospital Problem list     Assessment & Plan:   Septic shock vs cardiogenic shock -resolved 2/15, back on pressors 2/17 Pressor requirements seem to be related to sedation and cardiogenic rather than sepsis Fever 2/18, -Completed 7 days of meropenem for UTI/pneumonia -Co-ox improved on milrinone 0.25  -empiric Zosyn/vancomycin for H CAP, lines changed 2/18 -Using vasopressin instead of Levophed for arrhythmias  Cardiogenic shock Left ventricle apical thrombus non ischemic cardiomyopathy with systolic congestive heart failure Polymorphic VT on 2/9, WCT 2/19  -Continue amiodarone PO, distal IV given 2/19 -Add low-dose Coreg if remains off Levophed consistently for 2 to 3 days -Lipitor, digoxin -Medications on hold include Coreg, Zaroxolyn, Entresto, Aldactone, torsemide  Acute hypoxemic respiratory failure Tracheostomy status -Proceed with spontaneous breathing trials with goal trach collar   Acute kidney injury Hyperkalemia -Remains anuric, no evidence of renal recovery -Resumed CRRT ,NO heparin, use citrate instead   Left MCA CVA Multiple other infarcts noted on MRI 1/95-[id number] embolic Acute metabolic encephalopathy -Intermittent fentanyl, versed -Taper Precedex , goal RASS 0 to -1   Thrombocytopenia - HIT +, await SRA, platelets recovering -Continue bivalirudin - avoid heparin (listed as allergy now)   DM type II poorly controlled. - SSI -Increase 25 units Levemir daily -Ct 4 units NovoLog every 4 hours TF coverage -CBG 1 40-180 acceptable  Hx of cocaine abuse. depression. - not active per UDS  - continue prozac  Summary-goal here would be to try and liberate him from the vent and see if he  will tolerate coming off CRRT, doubtful that he will do so with his weak heart  Best practice:  Diet: NPO,  TFs DVT prophylaxis: SCDs GI prophylaxis: protonix Mobility: bed rest Code Status: full code Disposition: ICU Family -fianc [name] & mom updated   The patient is critically ill with multiple organ systems failure and requires high complexity decision making for assessment and support, frequent evaluation and titration of therapies, application of advanced monitoring technologies and extensive interpretation of multiple databases. Critical Care Time devoted to patient care services described in this note independent of APP/resident  time is 33 minutes.     [name] MD. Shade Flood. [facility]  If no response to pager , please call [phone]   05/07/2019

## 2019-05-07 NOTE — Progress Notes (Signed)
STROKE TEAM PROGRESS NOTE   INTERVAL HISTORY Mom at bedside. Pt seems more awake alert and able to track on both sides with head moving from side to side. Still not following commands. Mild withdraw to pain on the RUE but no other limbs. B/l babinski positive. Still on CRRT, pressor, and abx, as well as angiomax.    OBJECTIVE Vitals:   05/07/19 0600 05/07/19 0700 05/07/19 0728 05/07/19 0732  BP: 115/82 100/62 126/84   Pulse: (!) 103 81 (!) 104   Resp: (!) 23 20 (!) 25   Temp:      TempSrc:      SpO2: 100% 100% 100% 100%  Weight:      Height:       CBC:  Recent Labs  Lab 05/06/19 0500 05/06/19 1558 05/07/19 0246 05/07/19 0347  WBC 15.6*  --  14.0*  --   HGB 9.7*   < > 8.4* 9.5*  HCT 31.1*   < > 26.4* 28.0*  MCV 97.2  --  95.3  --   PLT 165  --  194  --    < > = values in this interval not displayed.   Basic Metabolic Panel:  Recent Labs  Lab 05/06/19 0500 05/06/19 0807 05/06/19 1558 05/06/19 1759 05/07/19 0158 05/07/19 0206 05/07/19 0246 05/07/19 0246 05/07/19 0347 05/07/19 0630  NA 134*   < > 135  135   < >   < >  --  136   < > 135 134*  K 7.3*   < > 4.6  4.6   < >   < >  --  4.2   < > 4.3 4.3  CL 97*   < > 99  100   < >   < >  --  97*   < > 98 94*  CO2 20*   < > 19*   < >  --   --  22  --   --  23  GLUCOSE 171*   < > 222*  225*   < >   < >  --  253*   < > 287* 304*  BUN 110*   < > 121*  115*   < >   < >  --  94*   < > 86* 86*  CREATININE 6.23*   < > 6.30*  6.07*   < >   < >  --  4.78*   < > 4.50* 4.36*  CALCIUM 9.2   < > 8.3*   < >  --   --  7.8*  --   --  7.9*  MG 2.3  --   --   --   --  2.2  --   --   --   --   PHOS 7.2*   < > 8.1*  --   --   --   --   --   --  4.5   < > = values in this interval not displayed.    IMAGING CT HEAD CODE STROKE WO CONTRAST 04/26/2019 1. Asymmetric hyperdensity of a Left MCA branch in the Sylvian fissure suspicious for emergent large vessel occlusion in the setting.  2. No associated acute cortically based infarct,  ASPECTS 10. No acute hemorrhage.  3. Chronic right frontal operculum encephalomalacia.   CT Code Stroke CTA Head W/WO contrast CT Code Stroke CTA Neck W/WO contrast 04/20/2019 1. Positive for emergent large vessel occlusion at the Left MCA bifurcation. There is  reconstitution of the dominant posterior division branch.  2. Elsewhere negative CTA head and neck. Dominant right vertebral artery which supplies the basilar. Fetal type PCA origins.   CT CEREBRAL PERFUSION W CONTRAST 05/13/2019 1. CTP detects evidence of Left MCA territory oligemia concordant with the CTA findings.  2. No infarct core detected with standard CBF <30%.  3. 60 mL of penumbra detected with standard T-max > 6s.   CT HEAD WO CONTRAST 04/23/2019 Small region of loss of gray-white differentiation in the left frontal operculum consistent with a fairly small area of acute infarction compared to the previous perfusion abnormality. No hemorrhage or mass effect. Areas of low-density in the cerebellum that I favor are artifactual. There did not seem to be any abnormalities in that region on the previous examinations.   MRI / MRA Head WO Contrast  04/28/2019 Extensive acute infarction in the right cerebellum with swelling and petechial blood products. Acute infarction of the superior cerebellar peduncle on the right, with some infarction in the mid brain. Extensive acute infarction in the right temporal lobe and insula. Small acute infarctions in the frontal operculum region and right parietal region. Infarction affecting the right posterior basal ganglia and caudate nucleus. Petechial blood products present without frank hematoma. Acute infarction in the left insula and frontal operculum. Petechial blood products present without frank hematoma. Small punctate acute infarctions higher in the left frontoparietal junction region. No large or medium vessel occlusion seen presently. Restoration of flow in the left MCA. Flow appears to be present  in posterior circulation main vessels and branch vessels presently.   DG CHEST PORT 1 VIEW 05/03/2019 1. Coarsening patchy opacity in the left lung and right lung base, asymmetric edema versus infection. 2. Stable cardiomegaly. 3. Stable support apparatus. 04/30/2019 Improvement of bilateral airspace disease. 04/29/2019 Patchy opacities consistent with the given clinical history. Tubes and lines as described.  04/27/2019 Perhaps slightly improved aeration at the left lung base. Otherwise no change in the bilateral patchy airspace and interstitial changes, likely associated with small effusions. Lines and tubes unchanged. 04/26/2019 0854 1. Lines and tubes as described above. 2. Cardiomegaly with basilar airspace disease, likely associated with effusions. 3. Interstitial and alveolar opacities elsewhere may represent a combination of infection and edema and are little changed. 04/25/2019 0946 Overall improved aeration from the prior exam although persistent opacities and small effusions are noted. 04/23/19 1636 1. Interval placement of a right neck vascular sheath, tip positioned over the right brachiocephalic vein. 2.  Other support apparatus in unchanged position. 3. Slight interval increase in diffuse bilateral interstitial and heterogeneous airspace opacity, consistent with multifocal infection, edema, and/or ARDS. 4. Cardiomegaly. 04/23/2019 0757 Increasing patchy bilateral airspace opacities which may represent edema or infection. 05/10/2019 1958 1. Interval placement of neck vascular catheter, tip positioned near the superior cavoatrial junction.  2. Interval retraction of endotracheal tube, now positioned just above the thoracic inlet, approximately 6.5 cm above the carina.  3. Interval placement of esophagogastric tube, tip and side port below the diaphragm. 4. Stable cardiomegaly and diffuse bilateral interstitial airspace opacity, likely edema.  04/26/2019 0841 1. Endotracheal tube tip noted just  above the carina. Proximal repositioning of 2-3 should be considered. 2. Cardiomegaly again noted. Diffuse bilateral pulmonary infiltrates/edema. Small left pleural effusion.   ECHOCARDIOGRAM COMPLETE 04/26/2019 1. Left ventricular ejection fraction, by visual estimation, is <20%. The left ventricle has severely decreased function. There is no left ventricular hypertrophy.   2. Apex is not well-visualized. In setting of stroke  with severe LV systolic dysfunction, recommend repeat TTE with contrast to rule out LV apical thrombus   3. Severely dilated left ventricular internal cavity size.   4. Left ventricular diastolic parameters are consistent with Grade III diastolic dysfunction (restrictive).   5. Elevated left atrial pressure.   6. The left ventricle demonstrates global hypokinesis.   7. Global right ventricle has normal systolic function.The right ventricular size is mildly enlarged.   8. Left atrial size was moderately dilated.   9. Right atrial size was normal.  10. The mitral valve is normal in structure. Mild mitral valve regurgitation.  11. The tricuspid valve is normal in structure. Tricuspid valve regurgitation is trivial.  12. The aortic valve is normal in structure. Aortic valve regurgitation is not visualized. No evidence of aortic valve sclerosis or stenosis.  13. The pulmonic valve was not well visualized. Pulmonic valve regurgitation is not visualized.   Interventional Neuro Radiology - Cerebral Angiogram with Intervention 04/27/2019 S/P Lt common carotid arteriogram followed by complete revascularization of occluded LT MCA sup division mid M2 seg with x 1 pass with 18mx 40 mm solitaire X ret river device and penumbra aspiration with TICI 3 revascularization  ECG - SR rate 93 BPM. (See cardiology reading for complete details)  PHYSICAL EXAM     Temp:  [96.3 F (35.7 C)-98.9 F (37.2 C)] 97.8 F (36.6 C) (02/20 0400) Pulse Rate:  [65-137] 104 (02/20 0728) Resp:  [14-26] 25  (02/20 0728) BP: (77-132)/(48-97) 126/84 (02/20 0728) SpO2:  [100 %] 100 % (02/20 0732) Arterial Line BP: (89-140)/(49-81) 108/58 (02/20 0700) FiO2 (%):  [40 %] 40 % (02/20 0732) Weight:  [115.4 kg] 115.4 kg (02/20 0500)  General - obese young [place], intubated just off precedex.  Ophthalmologic - fundi not visualized due to noncooperation.  Cardiovascular - Regular rhythm and rate.  Neuro - on trach collar, eyes are spontaneously open.  He moves his neck from side to side responding to voice. Seems able to have bilateral gaze responding to voice also, however, he does not follow commands. Able to blinking to visual threat bilaterally. PERRL. Corneal reflex present, gag and cough present. Facial seems to be symmetric although bilaterally weakness.  Tongue protrusion not cooperative. On pain stimulation, only mild withdraw of RUE, no movement of other limbs. DTR 3+ BUEs and BLEs and bilateral positive babinski.  Sensation, coordination and gait not tested.   ASSESSMENT/PLAN Carlos Brewer is a 34y.o. male with history of NICM EF 15-20%, CHF, DM, Obesity, tobacco use, OSA, and hx of cocaine use presenting with aphasia and right sided weakness. The patient received IV t-PA on Friday 05/05/2019 at 4:45 AM. Mechanical thrombectomy left M2.  Stroke:  Extensive R cerebellar, midbrain, R MCA and L MCA infarcts s/p tPA & IR with left M2 TICI3 reperfusion - embolic - likely secondary to severe cardiomyopathy with low EF and LV thrombus  Code Stroke CT Head - left MCA hyperdensity.   CTA H&N - emergent large vessel occlusion at the Left MCA bifurcation.   CT Perfusion - 60 mL of penumbra detected.   CT head repeat - Small region of loss of gray-white differentiation in the left frontal operculum   MRI head - 04/28/2019 - extensive R cerebellar infarct w/ petechial hemorrhage. R cerebellar peduncle and midbrain infarct. Extensive R temporal lobe and insula infarct w/ R  frontal operculum and parietal infarcts. R posterior basal ganglia and caudate head infarct w/ petechial hemorrhage. L insula  and L frontal operculum infarcts w/ petechial hemorrhage. Small L frontoparietal jxn infarcts.   MRA head restoration L MCA flow. Flow in posterior circulation.   2D Echo - EF < 20%   2D with contrast - possible LV apical thrombus 0.8 x 0.6  Sars Corona Virus 2 - negative  LDL - 203  HgbA1c - 6.5  VTE prophylaxis - angiomax IV  aspirin 81 mg daily prior to admission, IV heparin discontinued d/t HIT, now on angiomax. As platelet count improving, added low dose aspirin 81 to Angiomax.     Therapy recommendations:  pending - order when able to participate   Disposition:  Pending  Change neuro checks to q2h  Encephalopathy  Not following commands or moving extremities  MRI extensive R and L brain and R cerebellar infarcts  Prognosis is poor, concerning for vegetative state vs minimally conscious state  Mother is next of kin. Ongoing GOC discussion, mom remains hopeful  HIT  On heparin IV for LV thrombus  Platelet [id number]->94  Heparin discontinued  HIT panel - HIT ab elevated. SRA pending    On Angiomax IV  CCM on board  Platelet monitoring  Acute Hypoxemic Respiratory failure  Intubated  Off sedation now   CCM on board  CXR - 05/03/19 - coarsening patchy opacity L and R lung bases edema vs infection  Prn precedex as BP will allow  S/p trach 2/18  Starting SBTs  Cardiogenic vs. Septic shock  Fever TMax 100.9->afebrile  Leukocytosis, WBCs 9.4 -> 14.0  CXR - 2/18 Patchy airspace disease throughout both lungs  .  On Meropenum 04/23/19>>04/29/19  Now on Vancomycin and Zosyn 05/06/19 >>  On hydrocortisone->off  U/A neg, Urine culture no growth   Blood cultures 2/6 neg   resp Cx 2/16 reincubated  Still on pressor with vasopressin  CCM plans low dose coreg, dig if able to be off pressors   LV  thrombus Severe nonischemic cardiomyopathy Chronic systolic CHF  2D Echo - EF < 20%   2D with contrast - possible LV apical thrombus 0.8 x 0.6  On heparin IV -> angiomax IV    On amiodarone gtt for wide-complex tachycardia and hypotension -> changed to po  Add beta blocker once BP allows  Cardiology on board  Polymorphic V-tach / SVT [place]  Elevated troponin Prolonged QT  On amiodarone drip -> now changed to po  Troponin trending down 16274->14226->2409   Cardiology on board  May consider cardioversion if needed  Episodes WCT 2:00 AM today -> Amiodarone  AKI  Creatinine - 2.72->4.36->4.20->3.40  BUN 59->86->72  Renal consult 04/23/19  Still minimal urinary output, no signs of renal recovery  On CRRT, pharmacist contacted about removing heparin from soln  Plan transition to intermittent dialysis  Hypotension w/ shock Hx Hypertension  Home BP meds: Coreg, Entresto  On pressors - vasopressin  SBP goal  >110  Stable  Shock liver d/t hypoperfusion, improving  GI on board  AST/ALT - >10000/10132 ->... ->57/192 (05/04/19)    INR 3.1->1.8->1.2  CK 602  Hyperlipidemia  Home Lipid lowering medication: Lipitor 40 mg daily  LDL 203, goal < 70  Ok to add back: Lipitor 40 mg daily   Will not use high intensity statin at this time given hx shock liver  Continue statin at discharge  Diabetes type II, uncontrolled  Home diabetic meds: insulin  Current diabetic meds: SS insulin, Levemir 20, novolog 4u q6h  HgbA1c 6.5, goal < 7.0  CBG monitoring  SSI  Dysphagia . Secondary to stroke . NPO . On tube feeding @ 50 . Speech on board  Tobacco abuse  Current smoker  Mom confirmed tobacco use  Smoking cessation counseling will be provided  Other Stroke Risk Factors  ETOH use, advised to drink no more than 1 alcoholic beverage per day.  Hx cocaine abuse, UDS neg   Obesity, Body mass index is 33.57 kg/m., recommend weight loss, diet  and exercise as appropriate   Obstructive sleep apnea - uses Cpap  Substance Abuse Hx of cocaine use  Other Active Problems  Code status - Full code  Hyperkalemia - 4.0 resolved  Metabolic acidosis, resolved  Depression on prozac  Hyperglycemia   Hospital day # 15  This patient is critically ill and at significant risk of neurological worsening, death and care requires constant monitoring of vital signs, hemodynamics,respiratory and cardiac monitoring, extensive review of multiple databases, frequent neurological assessment, discussion with family, other specialists and medical decision making of high complexity. I spent 35 minutes of neurocritical care time  in the care of  this patient. I had long discussion with mom at bedside, updated pt current condition, treatment plan and potential prognosis, and answered all the questions. She expressed understanding and appreciation.   Rosalin Hawking, MD PhD Stroke Neurology 05/07/2019 2:58 PM   To contact Stroke Continuity provider, please refer to [email or web address] After hours, contact General Neurology

## 2019-05-08 ENCOUNTER — Inpatient Hospital Stay (HOSPITAL_COMMUNITY): Payer: No Typology Code available for payment source

## 2019-05-08 DIAGNOSIS — Z930 Tracheostomy status: Secondary | ICD-10-CM | POA: Diagnosis not present

## 2019-05-08 DIAGNOSIS — J9600 Acute respiratory failure, unspecified whether with hypoxia or hypercapnia: Secondary | ICD-10-CM | POA: Diagnosis not present

## 2019-05-08 DIAGNOSIS — I639 Cerebral infarction, unspecified: Secondary | ICD-10-CM | POA: Diagnosis not present

## 2019-05-08 DIAGNOSIS — G9340 Encephalopathy, unspecified: Secondary | ICD-10-CM | POA: Diagnosis not present

## 2019-05-08 DIAGNOSIS — I63312 Cerebral infarction due to thrombosis of left middle cerebral artery: Secondary | ICD-10-CM | POA: Diagnosis not present

## 2019-05-08 DIAGNOSIS — N179 Acute kidney failure, unspecified: Secondary | ICD-10-CM | POA: Diagnosis not present

## 2019-05-08 LAB — CALCIUM, IONIZED
Calcium, Ionized, Serum: 4 mg/dL — ABNORMAL LOW (ref 4.5–5.6)
Calcium, Ionized, Serum: 4.3 mg/dL — ABNORMAL LOW (ref 4.5–5.6)

## 2019-05-08 LAB — POCT I-STAT, CHEM 8
BUN: 37 mg/dL — ABNORMAL HIGH (ref 6–20)
BUN: 37 mg/dL — ABNORMAL HIGH (ref 6–20)
BUN: 38 mg/dL — ABNORMAL HIGH (ref 6–20)
BUN: 40 mg/dL — ABNORMAL HIGH (ref 6–20)
BUN: 40 mg/dL — ABNORMAL HIGH (ref 6–20)
BUN: 44 mg/dL — ABNORMAL HIGH (ref 6–20)
BUN: 54 mg/dL — ABNORMAL HIGH (ref 6–20)
Calcium, Ion: 0.34 mmol/L — CL (ref 1.15–1.40)
Calcium, Ion: 0.37 mmol/L — CL (ref 1.15–1.40)
Calcium, Ion: 0.38 mmol/L — CL (ref 1.15–1.40)
Calcium, Ion: 0.51 mmol/L — CL (ref 1.15–1.40)
Calcium, Ion: 0.96 mmol/L — ABNORMAL LOW (ref 1.15–1.40)
Calcium, Ion: 0.99 mmol/L — ABNORMAL LOW (ref 1.15–1.40)
Calcium, Ion: 1.35 mmol/L (ref 1.15–1.40)
Chloride: 94 mmol/L — ABNORMAL LOW (ref 98–111)
Chloride: 94 mmol/L — ABNORMAL LOW (ref 98–111)
Chloride: 94 mmol/L — ABNORMAL LOW (ref 98–111)
Chloride: 95 mmol/L — ABNORMAL LOW (ref 98–111)
Chloride: 95 mmol/L — ABNORMAL LOW (ref 98–111)
Chloride: 95 mmol/L — ABNORMAL LOW (ref 98–111)
Chloride: 96 mmol/L — ABNORMAL LOW (ref 98–111)
Creatinine, Ser: 2.7 mg/dL — ABNORMAL HIGH (ref 0.61–1.24)
Creatinine, Ser: 2.8 mg/dL — ABNORMAL HIGH (ref 0.61–1.24)
Creatinine, Ser: 2.9 mg/dL — ABNORMAL HIGH (ref 0.61–1.24)
Creatinine, Ser: 3 mg/dL — ABNORMAL HIGH (ref 0.61–1.24)
Creatinine, Ser: 3.4 mg/dL — ABNORMAL HIGH (ref 0.61–1.24)
Creatinine, Ser: 3.5 mg/dL — ABNORMAL HIGH (ref 0.61–1.24)
Creatinine, Ser: 3.8 mg/dL — ABNORMAL HIGH (ref 0.61–1.24)
Glucose, Bld: 221 mg/dL — ABNORMAL HIGH (ref 70–99)
Glucose, Bld: 253 mg/dL — ABNORMAL HIGH (ref 70–99)
Glucose, Bld: 276 mg/dL — ABNORMAL HIGH (ref 70–99)
Glucose, Bld: 285 mg/dL — ABNORMAL HIGH (ref 70–99)
Glucose, Bld: 292 mg/dL — ABNORMAL HIGH (ref 70–99)
Glucose, Bld: 317 mg/dL — ABNORMAL HIGH (ref 70–99)
Glucose, Bld: 334 mg/dL — ABNORMAL HIGH (ref 70–99)
HCT: 30 % — ABNORMAL LOW (ref 39.0–52.0)
HCT: 31 % — ABNORMAL LOW (ref 39.0–52.0)
HCT: 32 % — ABNORMAL LOW (ref 39.0–52.0)
HCT: 33 % — ABNORMAL LOW (ref 39.0–52.0)
HCT: 34 % — ABNORMAL LOW (ref 39.0–52.0)
HCT: 34 % — ABNORMAL LOW (ref 39.0–52.0)
HCT: 35 % — ABNORMAL LOW (ref 39.0–52.0)
Hemoglobin: 10.2 g/dL — ABNORMAL LOW (ref 13.0–17.0)
Hemoglobin: 10.5 g/dL — ABNORMAL LOW (ref 13.0–17.0)
Hemoglobin: 10.9 g/dL — ABNORMAL LOW (ref 13.0–17.0)
Hemoglobin: 11.2 g/dL — ABNORMAL LOW (ref 13.0–17.0)
Hemoglobin: 11.6 g/dL — ABNORMAL LOW (ref 13.0–17.0)
Hemoglobin: 11.6 g/dL — ABNORMAL LOW (ref 13.0–17.0)
Hemoglobin: 11.9 g/dL — ABNORMAL LOW (ref 13.0–17.0)
Potassium: 3.8 mmol/L (ref 3.5–5.1)
Potassium: 3.8 mmol/L (ref 3.5–5.1)
Potassium: 4 mmol/L (ref 3.5–5.1)
Potassium: 4 mmol/L (ref 3.5–5.1)
Potassium: 4.1 mmol/L (ref 3.5–5.1)
Potassium: 4.1 mmol/L (ref 3.5–5.1)
Potassium: 4.3 mmol/L (ref 3.5–5.1)
Sodium: 136 mmol/L (ref 135–145)
Sodium: 136 mmol/L (ref 135–145)
Sodium: 138 mmol/L (ref 135–145)
Sodium: 138 mmol/L (ref 135–145)
Sodium: 140 mmol/L (ref 135–145)
Sodium: 140 mmol/L (ref 135–145)
Sodium: 140 mmol/L (ref 135–145)
TCO2: 28 mmol/L (ref 22–32)
TCO2: 28 mmol/L (ref 22–32)
TCO2: 28 mmol/L (ref 22–32)
TCO2: 28 mmol/L (ref 22–32)
TCO2: 29 mmol/L (ref 22–32)
TCO2: 29 mmol/L (ref 22–32)
TCO2: 31 mmol/L (ref 22–32)

## 2019-05-08 LAB — CBC
HCT: 29.2 % — ABNORMAL LOW (ref 39.0–52.0)
Hemoglobin: 9.3 g/dL — ABNORMAL LOW (ref 13.0–17.0)
MCH: 30.9 pg (ref 26.0–34.0)
MCHC: 31.8 g/dL (ref 30.0–36.0)
MCV: 97 fL (ref 80.0–100.0)
Platelets: 362 10*3/uL (ref 150–400)
RBC: 3.01 MIL/uL — ABNORMAL LOW (ref 4.22–5.81)
RDW: 14.2 % (ref 11.5–15.5)
WBC: 18.3 10*3/uL — ABNORMAL HIGH (ref 4.0–10.5)
nRBC: 0 % (ref 0.0–0.2)

## 2019-05-08 LAB — GLUCOSE, CAPILLARY
Glucose-Capillary: 200 mg/dL — ABNORMAL HIGH (ref 70–99)
Glucose-Capillary: 203 mg/dL — ABNORMAL HIGH (ref 70–99)
Glucose-Capillary: 239 mg/dL — ABNORMAL HIGH (ref 70–99)
Glucose-Capillary: 270 mg/dL — ABNORMAL HIGH (ref 70–99)
Glucose-Capillary: 285 mg/dL — ABNORMAL HIGH (ref 70–99)
Glucose-Capillary: 372 mg/dL — ABNORMAL HIGH (ref 70–99)

## 2019-05-08 LAB — RENAL FUNCTION PANEL
Albumin: 2.3 g/dL — ABNORMAL LOW (ref 3.5–5.0)
Albumin: 2.4 g/dL — ABNORMAL LOW (ref 3.5–5.0)
Anion gap: 18 — ABNORMAL HIGH (ref 5–15)
Anion gap: 18 — ABNORMAL HIGH (ref 5–15)
BUN: 53 mg/dL — ABNORMAL HIGH (ref 6–20)
BUN: 44 mg/dL — ABNORMAL HIGH (ref 6–20)
CO2: 26 mmol/L (ref 22–32)
CO2: 27 mmol/L (ref 22–32)
Calcium: 9.1 mg/dL (ref 8.9–10.3)
Calcium: 10 mg/dL (ref 8.9–10.3)
Chloride: 94 mmol/L — ABNORMAL LOW (ref 98–111)
Chloride: 93 mmol/L — ABNORMAL LOW (ref 98–111)
Creatinine, Ser: 3.62 mg/dL — ABNORMAL HIGH (ref 0.61–1.24)
Creatinine, Ser: 3.35 mg/dL — ABNORMAL HIGH (ref 0.61–1.24)
GFR calc Af Amer: 24 mL/min — ABNORMAL LOW (ref >60)
GFR calc Af Amer: 26 mL/min — ABNORMAL LOW (ref >60)
GFR calc non Af Amer: 21 mL/min — ABNORMAL LOW (ref >60)
GFR calc non Af Amer: 23 mL/min — ABNORMAL LOW (ref >60)
Glucose, Bld: 280 mg/dL — ABNORMAL HIGH (ref 70–99)
Glucose, Bld: 204 mg/dL — ABNORMAL HIGH (ref 70–99)
Phosphorus: 4.4 mg/dL (ref 2.5–4.6)
Phosphorus: 3.8 mg/dL (ref 2.5–4.6)
Potassium: 4 mmol/L (ref 3.5–5.1)
Potassium: 3.9 mmol/L (ref 3.5–5.1)
Sodium: 138 mmol/L (ref 135–145)
Sodium: 138 mmol/L (ref 135–145)

## 2019-05-08 LAB — COOXEMETRY PANEL
Carboxyhemoglobin: 2 % — ABNORMAL HIGH (ref 0.5–1.5)
Methemoglobin: 1.1 % (ref 0.0–1.5)
O2 Saturation: 75.5 %
Total hemoglobin: 9.7 g/dL — ABNORMAL LOW (ref 12.0–16.0)

## 2019-05-08 LAB — APTT: aPTT: 50 seconds — ABNORMAL HIGH (ref 24–36)

## 2019-05-08 MED ORDER — INSULIN ASPART 100 UNIT/ML ~~LOC~~ SOLN: 8.0000 [IU] | SUBCUTANEOUS | Status: DC

## 2019-05-08 MED ORDER — PIPERACILLIN-TAZOBACTAM 3.375 G IVPB
3.3750 g | Freq: Four times a day (QID) | INTRAVENOUS | Status: DC
  Administered 2019-05-08 – 2019-05-09 (×3): 3.375 g via INTRAVENOUS
  Filled 2019-05-08 (×4): qty 50

## 2019-05-08 MED ORDER — INSULIN ASPART 100 UNIT/ML ~~LOC~~ SOLN
10.0000 [IU] | SUBCUTANEOUS | Status: DC
  Administered 2019-05-08 – 2019-05-09 (×6): 10 [IU] via SUBCUTANEOUS

## 2019-05-08 NOTE — Progress Notes (Signed)
Assisted tele visit to patient with family member.  Raygen Linquist P, RN  

## 2019-05-08 NOTE — Progress Notes (Addendum)
NAME:  Carlos Brewer., MRN:  [id number], DOB:  May 22, 1985, LOS: 36 ADMISSION DATE:  04/24/2019, CONSULTATION DATE:  04/19/2019 REFERRING MD:  Dr. Leonel Ramsay, CHIEF COMPLAINT:  Slurred speech  Brief History   34 yo male smoker found to have slurred speech and Rt sided weakness.  CT head showed M2 occlusion.  Ultimately treated with thrombolytic and thrombectomy by IR.  Required intubation for airway protection. Course complicated by septic shock, AKI requiring CRRT and polymorphic VT  Past Medical History  Systolic CHF with non ischemic CM, Cocaine abuse, OSA, DM Hx of CHF (EF 15%)  Hx myocarditis Smoker 1/2 ppd   Significant Hospital Events   2/05 Admit, tPA, IR thrombectomy 2/6 100% on fvent at 2300  Overnight requiring increasing levophed and phenylephrine.    2/6: switched to levophed, epi, started antibiotics, started on CRRT.  2/9  developed wide-complex tachycardia and hypotension, started on amiodarone drip, increase Levophed drip 2/15 drop in platelets, heparin stopped, bival started 2/17 Hypotensive and back on Levophed 2/18 trach, lines changed 2/19 started milrinone , fever 103 2/20 Episode of wide-complex tachycardia yesterday, changed from Levophed to vasopressin   Consults:  Neuro IR  Procedures:  ETT 2/05 >> 2/18 [facility] CVL 2/5 >>2/18  RT [facility] CVL 2/18 >> [facility] HD cath 2/6 >>2/18  RT fem HD 2/19 >>  2/5-2/6:  TPA given at 428 am (total of 90 Mg) 520 am went to IR  S/P Lt common carotid arteriogram followed by complete revascularization of occluded LT MCA sup division mid M2 seg with x 1 pass with 80mx 40 mm solitaire X ret river device and penumbra aspiration with TICI 3 revascularization   Significant Diagnostic Tests:  CT angio head/neck 2/05 >> occlusion of Lt MCA bifurcation Echo 2/05 >> EF less than 20%, cannot rule out apical thrombus MRI brain 2/11 > extensive acute infarction of multiple areas without large or medium vessel  occlusion Echocardiogram 2/8 Left ventricular ejection fraction, by estimation, is <20%. The left  ventricle has severely decreased function. The left ventricle demonstrates  global hypokinesis. The left ventricular internal cavity size was severely  dilated. Possible small 0.8 x  0.6 cm apical thrombus. Somewhat subtle finding, could confirm with  cardiac MRI. However, if patient has had CVA, would be reasonable to  anticoagulate given severity of LV dysfunction if no contraindications to  anticoagulation.   Micro Data:  SARS CoV2 PCR 2/05 >> negative Influenza PCR 2/05 >> negative Urine 2/6 >> ng resp 2/6 >> nml flora BC 2/6 >>ng resp 2/16 >> [facility] >>  Antimicrobials:  mero 2/6 -2/12 Zosyn 2/6 , 2/19 >> vanc 2/6 >> 2/9 , 2/19 >>  Interim history/subjective:   Remains critically ill, on CRRT Off vasopressin Trach, vent Anuric Did not sleep overnight per RN  Objective   Blood pressure 138/79, pulse (!) 127, temperature 98.1 F (36.7 C), temperature source Axillary, resp. rate 17, height 6' 1"  (1.854 m), weight 111.1 kg, SpO2 98 %.    Vent Mode: PRVC FiO2 (%):  [40 %] 40 % Set Rate:  [24 bmp] 24 bmp Vt Set:  [630 mL] 630 mL PEEP:  [5 cmH20] 5 cmH20 Plateau Pressure:  [16 cmH20-19 cmH20] 19 cmH20   Intake/Output Summary (Last 24 hours) at 05/08/2019 0851 Last data filed at 05/08/2019 0800 Gross per 24 hour  Intake 3970.63 ml  Output 8048 ml  Net -4077.37 ml   Filed Weights   05/06/19 0500 05/07/19 0500 05/08/19 0500  Weight: 114.2 kg 115.4 kg 111.1 kg    Examination:  General -young well-built man, intubated, in no distress Eyes -pupils reactive , mild pallor, no icterus ENT -tracheostomy site, no bleeding Cardiac -S1-S2 tacky, no murmur Chest - bilateral ventilated breath sounds, mild blood-tinged secretions Abdomen - soft, non tender, + bowel sounds Extremities - no cyanosis, clubbing, or edema Skin - no rashes Neuro -RASS 0, awake, tracks,  intermittently follows one-step commands, yawning and chewing movements   Chest x-ray 2/20 personally reviewed, unchanged bilateral patchy infiltrates  Labs show increasing leukocytosis, elevated sugars, normal electrolytes   Resolved Hospital Problem list     Assessment & Plan:   Septic shock vs cardiogenic shock -resolved 2/15, back on pressors 2/17 Pressor requirements seem to be related to sedation and cardiogenic rather than sepsis  -Completed 7 days of meropenem for UTI/pneumonia -Fever 2/18, leukocytosis, lines changed -Co-ox improved on milrinone 0.25 -continue for another 24 to 48 hours to see if there is renal recovery -empiric Zosyn/vancomycin for H CAP -can DC vancomycin if blood culture remains negative  2/22   Cardiogenic shock Left ventricle apical thrombus non ischemic cardiomyopathy with systolic congestive heart failure Polymorphic VT on 2/9, WCT 2/19  -Continue amiodarone PO -Add low-dose Coreg if remains off pressors consistently for 2 to 3 days -Lipitor, digoxin -Medications on hold include Coreg, Zaroxolyn, Entresto, Aldactone, torsemide  Acute hypoxemic respiratory failure Tracheostomy status -Awaiting trach collar daytime vent at night -Attempt 24 hours , proceed with speech eval -PT eval may need to be mobilized in spite of femoral catheter   Acute kidney injury  -Remains anuric, no evidence of renal recovery -Became hyperkalemic within 1 day off CRRT suggesting highly catabolic -Ct CRRT ,NO heparin, use citrate instead -at some point will need to attempt off CRRT again -Femoral catheter may need to be changed if this interferes with mobilization   Left MCA CVA Multiple other infarcts noted on MRI [id number] embolic Acute metabolic encephalopathy -Intermittent fentanyl, versed - goal RASS 0 to +1 -   Thrombocytopenia - HIT +, await SRA, platelets recovered -Continue bivalirudin - avoid heparin (listed as allergy now)   DM type II  poorly controlled. - SSI -Increase 25 units Levemir daily -Increase 8 units NovoLog every 4 hours TF coverage -CBG 1 40-180 acceptable  Hx of cocaine abuse. depression. - not active per UDS  - continue prozac  Summary-he is coming off the vent, remains to be seen if he will come off CRRT and can transition to intermittent HD given his severe cardiomyopathy  Best practice:  Diet: NPO,  TFs DVT prophylaxis: SCDs GI prophylaxis: protonix Mobility: bed rest Code Status: full code Disposition: ICU Family -fianc Lurline Idol 2/21 & mom updated   The patient is critically ill with multiple organ systems failure and requires high complexity decision making for assessment and support, frequent evaluation and titration of therapies, application of advanced monitoring technologies and extensive interpretation of multiple databases. Critical Care Time devoted to patient care services described in this note independent of APP/resident  time is 32 minutes.      [name] MD. Shade Flood. [facility]  If no response to pager , please call [phone]   05/08/2019

## 2019-05-08 NOTE — Progress Notes (Signed)
Patient ID: Carlos Brewer., male   DOB: April 02, 1985, 34 y.o.   MRN: [id number] S: Able to wean off of pressors with addition of milrinone. O:BP 126/77 (BP Location: Left Arm)   Pulse (!) 119   Temp 98.1 F (36.7 C) (Axillary)   Resp (!) 22   Ht _0  (1.854 m)   Wt 111.1 kg   SpO2 100%   BMI 32.31 kg/m   Intake/Output Summary (Last 24 hours) at 05/08/2019 1259 Last data filed at 05/08/2019 1200 Gross per 24 hour  Intake 3952.44 ml  Output 7982 ml  Net -4029.56 ml   Intake/Output: I/O last 3 completed shifts: In: 6268.5 [I.V.:3723.8; NG/GT:1750; IV Piggyback:794.6] Out: 1093 [Other:9324; Stool:600]  Intake/Output this shift:  Total I/O In: 1083 [I.V.:494.7; NG/GT:350; IV Piggyback:238.3] Out: 1602 [Other:1502; Stool:100] Weight change: -4.3 kg Gen: awake in bed, on vent via trach, nonverbal and not following commands but is tracking CVS: tachy at 119 Resp:cta Abd: benign Ext: no edema, right femoral HD catheter  Recent Labs  Lab 05/04/19 0453 05/04/19 1739 05/05/19 0500 05/05/19 0501 05/05/19 1657 05/05/19 1657 05/06/19 0500 05/06/19 0500 05/06/19 0807 05/06/19 1156 05/06/19 1558 05/06/19 1559 05/07/19 0246 05/07/19 0343 05/07/19 0630 05/07/19 0821 05/07/19 1611 05/07/19 1843 05/07/19 1953 05/07/19 2000 05/07/19 2022 05/07/19 2349 05/08/19 0307  NA  --    < > 134*   < > 137   < > 134*   < > 134*   < > 135  135   < > 136   < > 134*   < > 136 138 138 136 134* 138 138  K  --    < > 4.6   < > 5.3*   < > 7.3*   < > 6.4*   < > 4.6  4.6   < > 4.2   < > 4.3   < > 4.2 4.2 4.2 4.3 4.2 4.2 4.0  CL  --    < > 98   < > 101   < > 97*   < > 99   < > 99  100   < > 97*   < > 94*   < > 97* 95* 95* 96* 94* 94* 94*  CO2  --    < > 19*   < > 20*   < > 20*  --  19*  --  19*  --  22  --  23  --   --   --   --   --  24  --  26  GLUCOSE  --    < > 209*   < > 82   < > 171*   < > 257*   < > 222*  225*   < > 253*   < > 304*   < > 285* 308* 296* 291* 280* 336* 280*  BUN   --    < > 63*   < > 79*   < > 110*   < > 119*   < > 121*  115*   < > 94*   < > 86*   < > 64* 52* 50* 59* 64* 48* 53*  CREATININE  --    < > 3.05*   < > 4.01*   < > 6.23*   < > 6.48*   < > 6.30*  6.07*   < > 4.78*   < > 4.36*   < > 4.20* 3.10* 3.10* 3.90* 3.77* 3.00* 3.62*  ALBUMIN 2.5*   < > 2.6*  --  2.5*  --  2.3*  --   --   --  2.0*  --   --   --  2.1*  --   --   --   --   --  2.3*  --  2.3*  CALCIUM  --    < > 7.8*   < > 9.1   < > 9.2  --  9.3  --  8.3*  --  7.8*  --  7.9*  --   --   --   --   --  8.5*  --  9.1  PHOS  --    < > 4.4  --  6.1*  --  7.2*  --   --   --  8.1*  --   --   --  4.5  --   --   --   --   --  4.2  --  4.4  AST 57*  --   --   --   --   --   --   --   --   --   --   --   --   --   --   --   --   --   --   --   --   --   --   ALT 192*  --   --   --   --   --   --   --   --   --   --   --   --   --   --   --   --   --   --   --   --   --   --    < > = values in this interval not displayed.   Liver Function Tests: Recent Labs  Lab 05/04/19 0453 05/04/19 1739 05/07/19 0630 05/07/19 2022 05/08/19 0307  AST 57*  --   --   --   --   ALT 192*  --   --   --   --   ALKPHOS 117  --   --   --   --   BILITOT 1.6*  --   --   --   --   PROT 7.4  --   --   --   --   ALBUMIN 2.5*   < > 2.1* 2.3* 2.3*   < > = values in this interval not displayed.   No results for input(s): LIPASE, AMYLASE in the last 168 hours. No results for input(s): AMMONIA in the last 168 hours. CBC: Recent Labs  Lab 05/04/19 0427 05/04/19 1808 05/05/19 0500 05/05/19 0501 05/06/19 0500 05/06/19 1558 05/07/19 0246 05/07/19 0343 05/07/19 2000 05/07/19 2349 05/08/19 0307  WBC 9.9  --  16.3*   < > 15.6*  --  14.0*  --   --   --  18.3*  HGB 11.1*   < > 11.2*   < > 9.7*   < > 8.4*   < > 9.9* 10.2* 9.3*  HCT 35.0*   < > 35.2*   < > 31.1*   < > 26.4*   < > 29.0* 30.0* 29.2*  MCV 95.6  --  96.7  --  97.2  --  95.3  --   --   --  97.0  PLT 65*  --  123*   < > 165  --  194  --   --   --  362   < > =  values in this interval not displayed.   Cardiac Enzymes: No results for input(s): CKTOTAL, CKMB, CKMBINDEX, TROPONINI in the last 168 hours. CBG: Recent Labs  Lab 05/07/19 1934 05/07/19 2329 05/08/19 0331 05/08/19 0813 05/08/19 1115  GLUCAP 278* 319* 285* 270* 372*    Iron Studies: No results for input(s): IRON, TIBC, TRANSFERRIN, FERRITIN in the last 72 hours. Studies/Results: DG Chest Port 1 View  Result Date: 05/08/2019 CLINICAL DATA:  Acute respiratory failure. EXAM: PORTABLE CHEST 1 VIEW COMPARISON:  05/05/2019 FINDINGS: Tracheostomy tube tip at the thoracic inlet. Weighted enteric tube tip below the diaphragm not included in the field of view. Right central line in the SVC. Cardiomegaly is not significantly changed. Patchy bilateral airspace disease is not significantly changed from prior. No pneumothorax. No large pleural effusion. IMPRESSION: 1. Unchanged patchy bilateral airspace disease. Stable cardiomegaly. 2. Stable support apparatus. Electronically Signed   By: Keith Rake M.D.   On: 05/08/2019 03:58   . sodium chloride   Intravenous Once  . amiodarone  200 mg Per Tube BID  . aspirin  81 mg Per Tube Daily  . atorvastatin  40 mg Per Tube q1800  . chlorhexidine gluconate (MEDLINE KIT)  15 mL Mouth Rinse BID  . Chlorhexidine Gluconate Cloth  6 each Topical Q0600  . feeding supplement (PRO-STAT SUGAR FREE 64)  60 mL Per Tube TID  . fentaNYL (SUBLIMAZE) injection  50 mcg Intravenous Once  . insulin aspart  0-20 Units Subcutaneous Q4H  . insulin aspart  10 Units Subcutaneous Q4H  . insulin detemir  25 Units Subcutaneous Daily  . mouth rinse  15 mL Mouth Rinse 10 times per day  . pantoprazole sodium  40 mg Per Tube Q1200  . sodium chloride flush  10-40 mL Intracatheter Q12H  . sodium chloride flush  3 mL Intravenous Q12H    BMET    Component Value Date/Time   NA 138 05/08/2019 0307   K 4.0 05/08/2019 0307   CL 94 (L) 05/08/2019 0307   CO2 26 05/08/2019 0307    GLUCOSE 280 (H) 05/08/2019 0307   BUN 53 (H) 05/08/2019 0307   CREATININE 3.62 (H) 05/08/2019 0307   CALCIUM 9.1 05/08/2019 0307   GFRNONAA 21 (L) 05/08/2019 0307   GFRAA 24 (L) 05/08/2019 0307   CBC    Component Value Date/Time   WBC 18.3 (H) 05/08/2019 0307   RBC 3.01 (L) 05/08/2019 0307   HGB 9.3 (L) 05/08/2019 0307   HCT 29.2 (L) 05/08/2019 0307   PLT 362 05/08/2019 0307   MCV 97.0 05/08/2019 0307   MCH 30.9 05/08/2019 0307   MCHC 31.8 05/08/2019 0307   RDW 14.2 05/08/2019 0307   LYMPHSABS 0.8 04/24/2019 0843   MONOABS 0.5 04/24/2019 0843   EOSABS 0.4 04/24/2019 0843   BASOSABS 0.0 04/24/2019 0843   Assessment/Plan:  1. AKIpresumably due to ATN with ischemic/nephrotoxic insults worsened by cardiogenic shock. Started on CRRT 04/23/19. Remains oliguric and dialysis dependentbut was able to come off of pressors last night. 1. Could not tolerate stopping CVVHD likely due to hypercatabolic state 2. Resumed CVVHD 2/19/21due to hyperkalemia which has resolved 3. Using 4K/0Ca baths for all fluids due to use of citrate 4. Hoping we can try to stop CVVHD if he remains off of pressors for another 24 hours.  He may be able to tolerate it now that he is no longer febrile and on pressors. 2. Cardiogenic shock-currentlyon vasopressin and milrinone due to  development of a fib with rvr and wide complex tachycardia.   1. Discussed case with Dr. Haroldine Laws who felt that he has a known severe NICM with EF <20% and did not feel that he would tolerate IHD but would continue to support with milrinone and midodrine/pressors to help promote renal perfusion.  If not would recommend transition to comfort care as his prognosis remains poor. 3. Acute stroke- presumed embolic with bilateral infarcts and LV thrombus- received tpa on admission 4. Vascular access-right fem HD catheter placed 05/06/19 5. SVT/polymorphic VT- on amiodarone per Cardiology 6. Acute hypoxic respiratory failure- on vent and  for trach soon if he remains off of pressors and platelets improve per PCCM 7. Thrombocytopenia- heparin discontinued. HIT panel ordered.Platelets improving. 8. Disposition- better today and is off of pressors however his overall prognosis is poor given need for trach and ongoing HD (cannot tolerate stopping CVVHD due to hypercatabolic state) as well as cardiogenic shock/cardiomyopathy. Recommend palliative care consult to help set goals/limits of care. Donetta Potts, MD [employer] [phone]

## 2019-05-08 NOTE — Progress Notes (Signed)
STROKE TEAM PROGRESS NOTE   INTERVAL HISTORY No family at bedside. Pt a little more awake alert than yesterday and able to track on both sides with head moving from side to side. Still not following commands. On trach collar today, tolerating well. Able to spontaneous moving RLE against gravity today. Off pressor, but still on CRRT, abx, and angiomax.    OBJECTIVE Vitals:   05/08/19 0600 05/08/19 0700 05/08/19 0731 05/08/19 0737  BP: (!) 140/101     Pulse: (!) 117 (!) 118 (!) 117 (!) 120  Resp: (!) 22 19 (!) 26 (!) 23  Temp:      TempSrc:      SpO2: 100% 100% 100% 100%  Weight:      Height:       CBC:  Recent Labs  Lab 05/07/19 0246 05/07/19 0343 05/07/19 2349 05/08/19 0307  WBC 14.0*  --   --  18.3*  HGB 8.4*   < > 10.2* 9.3*  HCT 26.4*   < > 30.0* 29.2*  MCV 95.3  --   --  97.0  PLT 194  --   --  362   < > = values in this interval not displayed.   Basic Metabolic Panel:  Recent Labs  Lab 05/06/19 0500 05/06/19 0807 05/07/19 0206 05/07/19 0246 05/07/19 2022 05/07/19 2022 05/07/19 2349 05/08/19 0307  NA 134*   < >  --    < > 134*   < > 138 138  K 7.3*   < >  --    < > 4.2   < > 4.2 4.0  CL 97*   < >  --    < > 94*   < > 94* 94*  CO2 20*   < >  --    < > 24  --   --  26  GLUCOSE 171*   < >  --    < > 280*   < > 336* 280*  BUN 110*   < >  --    < > 64*   < > 48* 53*  CREATININE 6.23*   < >  --    < > 3.77*   < > 3.00* 3.62*  CALCIUM 9.2   < >  --    < > 8.5*  --   --  9.1  MG 2.3  --  2.2  --   --   --   --   --   PHOS 7.2*   < >  --    < > 4.2  --   --  4.4   < > = values in this interval not displayed.    IMAGING CT HEAD CODE STROKE WO CONTRAST 05/15/2019 1. Asymmetric hyperdensity of a Left MCA branch in the Sylvian fissure suspicious for emergent large vessel occlusion in the setting.  2. No associated acute cortically based infarct, ASPECTS 10. No acute hemorrhage.  3. Chronic right frontal operculum encephalomalacia.   CT Code Stroke CTA Head W/WO  contrast CT Code Stroke CTA Neck W/WO contrast 04/20/2019 1. Positive for emergent large vessel occlusion at the Left MCA bifurcation. There is reconstitution of the dominant posterior division branch.  2. Elsewhere negative CTA head and neck. Dominant right vertebral artery which supplies the basilar. Fetal type PCA origins.   CT CEREBRAL PERFUSION W CONTRAST 04/19/2019 1. CTP detects evidence of Left MCA territory oligemia concordant with the CTA findings.  2. No infarct core detected with standard CBF <  30%.  3. 60 mL of penumbra detected with standard T-max > 6s.   CT HEAD WO CONTRAST 04/23/2019 Small region of loss of gray-white differentiation in the left frontal operculum consistent with a fairly small area of acute infarction compared to the previous perfusion abnormality. No hemorrhage or mass effect. Areas of low-density in the cerebellum that I favor are artifactual. There did not seem to be any abnormalities in that region on the previous examinations.   MRI / MRA Head WO Contrast  04/28/2019 Extensive acute infarction in the right cerebellum with swelling and petechial blood products. Acute infarction of the superior cerebellar peduncle on the right, with some infarction in the mid brain. Extensive acute infarction in the right temporal lobe and insula. Small acute infarctions in the frontal operculum region and right parietal region. Infarction affecting the right posterior basal ganglia and caudate nucleus. Petechial blood products present without frank hematoma. Acute infarction in the left insula and frontal operculum. Petechial blood products present without frank hematoma. Small punctate acute infarctions higher in the left frontoparietal junction region. No large or medium vessel occlusion seen presently. Restoration of flow in the left MCA. Flow appears to be present in posterior circulation main vessels and branch vessels presently.   DG CHEST PORT 1 VIEW 05/03/2019 1. Coarsening  patchy opacity in the left lung and right lung base, asymmetric edema versus infection. 2. Stable cardiomegaly. 3. Stable support apparatus. 04/30/2019 Improvement of bilateral airspace disease. 04/29/2019 Patchy opacities consistent with the given clinical history. Tubes and lines as described.  04/27/2019 Perhaps slightly improved aeration at the left lung base. Otherwise no change in the bilateral patchy airspace and interstitial changes, likely associated with small effusions. Lines and tubes unchanged. 04/26/2019 0854 1. Lines and tubes as described above. 2. Cardiomegaly with basilar airspace disease, likely associated with effusions. 3. Interstitial and alveolar opacities elsewhere may represent a combination of infection and edema and are little changed. 04/25/2019 0946 Overall improved aeration from the prior exam although persistent opacities and small effusions are noted. 04/23/19 1636 1. Interval placement of a right neck vascular sheath, tip positioned over the right brachiocephalic vein. 2.  Other support apparatus in unchanged position. 3. Slight interval increase in diffuse bilateral interstitial and heterogeneous airspace opacity, consistent with multifocal infection, edema, and/or ARDS. 4. Cardiomegaly. 04/23/2019 0757 Increasing patchy bilateral airspace opacities which may represent edema or infection. 04/28/2019 1958 1. Interval placement of neck vascular catheter, tip positioned near the superior cavoatrial junction.  2. Interval retraction of endotracheal tube, now positioned just above the thoracic inlet, approximately 6.5 cm above the carina.  3. Interval placement of esophagogastric tube, tip and side port below the diaphragm. 4. Stable cardiomegaly and diffuse bilateral interstitial airspace opacity, likely edema.  04/20/2019 0841 1. Endotracheal tube tip noted just above the carina. Proximal repositioning of 2-3 should be considered. 2. Cardiomegaly again noted. Diffuse bilateral  pulmonary infiltrates/edema. Small left pleural effusion.   ECHOCARDIOGRAM COMPLETE 04/21/2019 1. Left ventricular ejection fraction, by visual estimation, is <20%. The left ventricle has severely decreased function. There is no left ventricular hypertrophy.   2. Apex is not well-visualized. In setting of stroke with severe LV systolic dysfunction, recommend repeat TTE with contrast to rule out LV apical thrombus   3. Severely dilated left ventricular internal cavity size.   4. Left ventricular diastolic parameters are consistent with Grade III diastolic dysfunction (restrictive).   5. Elevated left atrial pressure.   6. The left ventricle demonstrates global hypokinesis.  7. Global right ventricle has normal systolic function.The right ventricular size is mildly enlarged.   8. Left atrial size was moderately dilated.   9. Right atrial size was normal.  10. The mitral valve is normal in structure. Mild mitral valve regurgitation.  11. The tricuspid valve is normal in structure. Tricuspid valve regurgitation is trivial.  12. The aortic valve is normal in structure. Aortic valve regurgitation is not visualized. No evidence of aortic valve sclerosis or stenosis.  13. The pulmonic valve was not well visualized. Pulmonic valve regurgitation is not visualized.   Interventional Neuro Radiology - Cerebral Angiogram with Intervention 05/04/2019 S/P Lt common carotid arteriogram followed by complete revascularization of occluded LT MCA sup division mid M2 seg with x 1 pass with 60mx 40 mm solitaire X ret river device and penumbra aspiration with TICI 3 revascularization  ECG - SR rate 93 BPM. (See cardiology reading for complete details)  PHYSICAL EXAM     Temp:  [97.7 F (36.5 C)-98.6 F (37 C)] 97.7 F (36.5 C) (02/21 0400) Pulse Rate:  [99-124] 120 (02/21 0737) Resp:  [16-33] 23 (02/21 0737) BP: (112-140)/(78-101) 140/101 (02/21 0600) SpO2:  [97 %-100 %] 100 % (02/21 0737) Arterial Line BP:  (119-166)/(69-101) 164/101 (02/21 0700) FiO2 (%):  [40 %] 40 % (02/21 0737) Weight:  [111.1 kg] 111.1 kg (02/21 0500)  General - obese young [place], intubated off sedation.  Ophthalmologic - fundi not visualized due to noncooperation.  Cardiovascular - Regular rhythm and rate.  Neuro - on trach collar, eyes are spontaneously open.  He moves his neck from side to side responding to voice. Seems able to have bilateral gaze to track, however, he does not follow commands except close/open eyes but still inconsistent. Able to blinking to visual threat bilaterally. PERRL. Corneal reflex present, gag and cough present. Facial seems to be symmetric although bilaterally weakness.  Tongue protrusion not cooperative. Spontaneous movement of RLE against gravity. On pain stimulation, only mild withdraw of RUE, no movement of LUE or LLE. Bilateral positive babinski.  Sensation, coordination and gait not tested.   ASSESSMENT/PLAN [name] is a 34y.o. male with history of NICM EF 15-20%, CHF, DM, Obesity, tobacco use, OSA, and hx of cocaine use presenting with aphasia and right sided weakness. The patient received IV t-PA on Friday 04/27/2019 at 4:45 AM. Mechanical thrombectomy left M2.  Stroke:  Extensive R cerebellar, midbrain, R MCA and L MCA infarcts s/p tPA & IR with left M2 TICI3 reperfusion - embolic - likely secondary to severe cardiomyopathy with low EF and LV thrombus  Code Stroke CT Head - left MCA hyperdensity.   CTA H&N - emergent large vessel occlusion at the Left MCA bifurcation.   CT Perfusion - 60 mL of penumbra detected.   CT head repeat - Small region of loss of gray-white differentiation in the left frontal operculum   MRI head - 04/28/2019 - extensive R cerebellar infarct w/ petechial hemorrhage. R cerebellar peduncle and midbrain infarct. Extensive R temporal lobe and insula infarct w/ R frontal operculum and parietal infarcts. R posterior basal  ganglia and caudate head infarct w/ petechial hemorrhage. L insula and L frontal operculum infarcts w/ petechial hemorrhage. Small L frontoparietal jxn infarcts.   MRA head restoration L MCA flow. Flow in posterior circulation.   2D Echo - EF < 20%   2D with contrast - possible LV apical thrombus 0.8 x 0.6  Sars Corona Virus 2 - negative  LDL - 203  HgbA1c - 6.5  VTE prophylaxis - angiomax IV  aspirin 81 mg daily prior to admission, IV heparin discontinued d/t HIT, now on angiomax. As platelet count improving, also on ASA 81 in addition to Angiomax.     Therapy recommendations:  pending - order when able to participate   Disposition:  Pending  Change neuro checks to q2h  Encephalopathy  multifactorial  Not following commands or moving extremities on request  MRI extensive R and L brain and R cerebellar infarcts  Prognosis is guarded  Mother is next of kin. Ongoing GOC discussion, mom remains hopeful  HIT  On heparin IV for LV thrombus  Platelet [id number]->94->362  Heparin discontinued  HIT panel - HIT ab elevated. SRA pending    On Angiomax IV  CCM on board  Platelet monitoring  Acute Hypoxemic Respiratory failure  Intubated  Off sedation now   CCM on board  CXR - 05/03/19 - coarsening patchy opacity L and R lung bases edema vs infection  Prn precedex as BP will allow  S/p trach 2/18  On trach collar  Cardiogenic vs. Septic shock  Fever TMax 100.9->afebrile-> 97.7  Leukocytosis, WBCs 9.4 ->14.0->18.3  CXR - 2/18 Patchy airspace disease throughout both lungs  .  On Meropenum 04/23/19>>04/29/19  Now on Vancomycin and Zosyn 05/06/19 >>  On hydrocortisone->off  U/A neg, Urine culture no growth   Blood cultures 2/6 neg   resp Cx 2/16 normal flora  Still on pressor with vasopressin  CCM plans low dose coreg, dig if able to be off pressors   LV thrombus Severe nonischemic cardiomyopathy Chronic systolic CHF  2D Echo -  EF < 20%   2D with contrast - possible LV apical thrombus 0.8 x 0.6  On heparin IV -> angiomax IV    On amiodarone gtt for wide-complex tachycardia and hypotension -> changed to po  Add beta blocker once BP allows  Cardiology on board  Polymorphic V-tach / SVT [place]  Elevated troponin Prolonged QT  On amiodarone drip -> now changed to po  Troponin trending down 16274->14226->2409   Cardiology on board  May consider cardioversion if needed  Episodes WCT 2:00 AM today -> Amiodarone  Tachycardia 117->120  AKI  Creatinine - 2.72->4.36->4.20->3.40->3.62  BUN 59->86->72->53  Renal consult 04/23/19  Still minimal urinary output, no signs of renal recovery  On CRRT, pharmacist contacted about removing heparin from [place] transition to intermittent dialysis once able  Hypotension w/ shock Hx Hypertension  Home BP meds: Coreg, Entresto  Off pressors  SBP goal  >110  Stable  Shock liver d/t hypoperfusion, improving  GI on board  AST/ALT - >10000/10132 ->... ->57/192 (05/04/19)    INR 3.1->1.8->1.2  CK 602  Hyperlipidemia  Home Lipid lowering medication: Lipitor 40 mg daily  LDL 203, goal < 70  Ok to add back: Lipitor 40 mg daily   Will not use high intensity statin at this time given hx shock liver  Continue statin at discharge  Diabetes type II, uncontrolled  Home diabetic meds: insulin  Current diabetic meds: SS insulin, Levemir 20, novolog 4u q6h  HgbA1c 6.5, goal < 7.0  CBG monitoring   SSI  Dysphagia . Secondary to stroke . NPO . On tube feeding @ 50 . Speech on board  Tobacco abuse  Current smoker  Mom confirmed tobacco use  Smoking cessation counseling will be provided  Other Stroke Risk Factors  ETOH use, advised to drink no more than 1  alcoholic beverage per day.  Hx cocaine abuse, UDS neg   Obesity, Body mass index is 32.31 kg/m., recommend weight loss, diet and exercise as appropriate   Obstructive  sleep apnea - uses Cpap  Substance Abuse Hx of cocaine use  Other Active Problems  Code status - Full code  Hyperkalemia - 4.0 resolved  Metabolic acidosis, resolved  Depression on prozac  Hyperglycemia   Anemia - Hb - 8.4->10.2->9.3  Hospital day # 16  This patient is critically ill and at significant risk of neurological worsening, death and care requires constant monitoring of vital signs, hemodynamics,respiratory and cardiac monitoring, extensive review of multiple databases, frequent neurological assessment, discussion with family, other specialists and medical decision making of high complexity. I spent 30 minutes of neurocritical care time  in the care of  this patient. I discussed with Dr. Elsworth Soho.   Rosalin Hawking, MD PhD Stroke Neurology 05/08/2019 4:30 PM  To contact Stroke Continuity provider, please refer to [email or web address] After hours, contact General Neurology

## 2019-05-08 NOTE — Progress Notes (Signed)
ANTICOAGULATION CONSULT NOTE  Pharmacy Consult:  Bivalirudin Indication: stroke 2/5, LV thrombus 2/8, HIT 2/15   Allergies  Allergen Reactions  . Heparin Other (See Comments)    Heparin induced thrombocytopenia. 2/15 HIT OD 1.692.     Patient Measurements: Height: 6\' 1"  (185.4 cm) Weight: 244 lb 14.9 oz (111.1 kg) IBW/kg (Calculated) : 79.9   Vital Signs: Temp: 98.1 F (36.7 C) (02/21 0800) Temp Source: Axillary (02/21 0800) BP: 133/94 (02/21 1000) Pulse Rate: 120 (02/21 1000)  Labs: Recent Labs    05/06/19 0500 05/06/19 0807 05/06/19 1156 05/06/19 1558 05/07/19 0246 05/07/19 0343 05/07/19 2000 05/07/19 2000 05/07/19 2022 05/07/19 2349 05/08/19 0307  HGB 9.7*  --   --    < > 8.4*   < > 9.9*   < >  --  10.2* 9.3*  HCT 31.1*  --   --    < > 26.4*   < > 29.0*  --   --  30.0* 29.2*  PLT 165  --   --   --  194  --   --   --   --   --  362  APTT 62*   < > 53*  --  56*  --   --   --   --   --  50*  CREATININE 6.23*   < >  --    < > 4.78*   < > 3.90*   < > 3.77* 3.00* 3.62*   < > = values in this interval not displayed.    Estimated Creatinine Clearance: 37.6 mL/min (A) (by C-G formula based on SCr of 3.62 mg/dL (H)).  Assessment: 34 yr old male with left MCA infarct - received TPA and revascularization with IR on 2/5. On 2/8 found to have small LV apical thrombus on ECHO. Pharmacy consulted to dose IV heparin, which was switched to bivalirudin 2/15 for HIT.  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 SRA pending.    APTT therapeutic and at the low end of normal; no issue with infusion per RN.  No bleeding reported.  Goal of Therapy:  APTT 50- 65 sec  Monitor platelets by anticoagulation protocol: Yes   Plan:  Increase bivalirudin slightly to 0.06 mg/kg/hr (AdjBW) Daily aPTT and CBC F/u SRA  Vlada Uriostegui D. Mina Marble, PharmD, BCPS, [place] 05/08/2019, 10:29 AM

## 2019-05-09 ENCOUNTER — Other Ambulatory Visit: Payer: Self-pay

## 2019-05-09 DIAGNOSIS — N179 Acute kidney failure, unspecified: Secondary | ICD-10-CM | POA: Diagnosis not present

## 2019-05-09 DIAGNOSIS — I639 Cerebral infarction, unspecified: Secondary | ICD-10-CM | POA: Diagnosis not present

## 2019-05-09 DIAGNOSIS — N170 Acute kidney failure with tubular necrosis: Secondary | ICD-10-CM | POA: Diagnosis not present

## 2019-05-09 DIAGNOSIS — R570 Cardiogenic shock: Secondary | ICD-10-CM | POA: Diagnosis not present

## 2019-05-09 DIAGNOSIS — E785 Hyperlipidemia, unspecified: Secondary | ICD-10-CM | POA: Diagnosis not present

## 2019-05-09 DIAGNOSIS — J9600 Acute respiratory failure, unspecified whether with hypoxia or hypercapnia: Secondary | ICD-10-CM | POA: Diagnosis not present

## 2019-05-09 DIAGNOSIS — I5023 Acute on chronic systolic (congestive) heart failure: Secondary | ICD-10-CM | POA: Diagnosis not present

## 2019-05-09 LAB — POCT I-STAT, CHEM 8
BUN: 40 mg/dL — ABNORMAL HIGH (ref 6–20)
BUN: 31 mg/dL — ABNORMAL HIGH (ref 6–20)
BUN: 38 mg/dL — ABNORMAL HIGH (ref 6–20)
BUN: 30 mg/dL — ABNORMAL HIGH (ref 6–20)
BUN: 36 mg/dL — ABNORMAL HIGH (ref 6–20)
BUN: 31 mg/dL — ABNORMAL HIGH (ref 6–20)
BUN: 31 mg/dL — ABNORMAL HIGH (ref 6–20)
BUN: 32 mg/dL — ABNORMAL HIGH (ref 6–20)
BUN: 36 mg/dL — ABNORMAL HIGH (ref 6–20)
BUN: 32 mg/dL — ABNORMAL HIGH (ref 6–20)
BUN: 40 mg/dL — ABNORMAL HIGH (ref 6–20)
Calcium, Ion: 1 mmol/L — ABNORMAL LOW (ref 1.15–1.40)
Calcium, Ion: 0.3 mmol/L — CL (ref 1.15–1.40)
Calcium, Ion: 1.01 mmol/L — ABNORMAL LOW (ref 1.15–1.40)
Calcium, Ion: 0.43 mmol/L — CL (ref 1.15–1.40)
Calcium, Ion: 0.99 mmol/L — ABNORMAL LOW (ref 1.15–1.40)
Calcium, Ion: 0.5 mmol/L — CL (ref 1.15–1.40)
Calcium, Ion: >2.5 mmol/L (ref 1.15–1.40)
Calcium, Ion: 0.48 mmol/L — CL (ref 1.15–1.40)
Calcium, Ion: 1.02 mmol/L — ABNORMAL LOW (ref 1.15–1.40)
Calcium, Ion: 0.44 mmol/L — CL (ref 1.15–1.40)
Calcium, Ion: 1.02 mmol/L — ABNORMAL LOW (ref 1.15–1.40)
Chloride: 98 mmol/L (ref 98–111)
Chloride: 94 mmol/L — ABNORMAL LOW (ref 98–111)
Chloride: 95 mmol/L — ABNORMAL LOW (ref 98–111)
Chloride: 94 mmol/L — ABNORMAL LOW (ref 98–111)
Chloride: 95 mmol/L — ABNORMAL LOW (ref 98–111)
Chloride: 79 mmol/L — ABNORMAL LOW (ref 98–111)
Chloride: 94 mmol/L — ABNORMAL LOW (ref 98–111)
Chloride: 94 mmol/L — ABNORMAL LOW (ref 98–111)
Chloride: 95 mmol/L — ABNORMAL LOW (ref 98–111)
Chloride: 95 mmol/L — ABNORMAL LOW (ref 98–111)
Chloride: 96 mmol/L — ABNORMAL LOW (ref 98–111)
Creatinine, Ser: 3.2 mg/dL — ABNORMAL HIGH (ref 0.61–1.24)
Creatinine, Ser: 2.3 mg/dL — ABNORMAL HIGH (ref 0.61–1.24)
Creatinine, Ser: 3.1 mg/dL — ABNORMAL HIGH (ref 0.61–1.24)
Creatinine, Ser: 2.5 mg/dL — ABNORMAL HIGH (ref 0.61–1.24)
Creatinine, Ser: 3.2 mg/dL — ABNORMAL HIGH (ref 0.61–1.24)
Creatinine, Ser: 2.6 mg/dL — ABNORMAL HIGH (ref 0.61–1.24)
Creatinine, Ser: 2.7 mg/dL — ABNORMAL HIGH (ref 0.61–1.24)
Creatinine, Ser: 2.5 mg/dL — ABNORMAL HIGH (ref 0.61–1.24)
Creatinine, Ser: 3.3 mg/dL — ABNORMAL HIGH (ref 0.61–1.24)
Creatinine, Ser: 2.4 mg/dL — ABNORMAL HIGH (ref 0.61–1.24)
Creatinine, Ser: 3.3 mg/dL — ABNORMAL HIGH (ref 0.61–1.24)
Glucose, Bld: 253 mg/dL — ABNORMAL HIGH (ref 70–99)
Glucose, Bld: 260 mg/dL — ABNORMAL HIGH (ref 70–99)
Glucose, Bld: 274 mg/dL — ABNORMAL HIGH (ref 70–99)
Glucose, Bld: 295 mg/dL — ABNORMAL HIGH (ref 70–99)
Glucose, Bld: 300 mg/dL — ABNORMAL HIGH (ref 70–99)
Glucose, Bld: 313 mg/dL — ABNORMAL HIGH (ref 70–99)
Glucose, Bld: >700 mg/dL (ref 70–99)
Glucose, Bld: 291 mg/dL — ABNORMAL HIGH (ref 70–99)
Glucose, Bld: 306 mg/dL — ABNORMAL HIGH (ref 70–99)
Glucose, Bld: 315 mg/dL — ABNORMAL HIGH (ref 70–99)
Glucose, Bld: 318 mg/dL — ABNORMAL HIGH (ref 70–99)
HCT: 33 % — ABNORMAL LOW (ref 39.0–52.0)
HCT: 35 % — ABNORMAL LOW (ref 39.0–52.0)
HCT: 36 % — ABNORMAL LOW (ref 39.0–52.0)
HCT: 35 % — ABNORMAL LOW (ref 39.0–52.0)
HCT: 38 % — ABNORMAL LOW (ref 39.0–52.0)
HCT: 28 % — ABNORMAL LOW (ref 39.0–52.0)
HCT: 37 % — ABNORMAL LOW (ref 39.0–52.0)
HCT: 36 % — ABNORMAL LOW (ref 39.0–52.0)
HCT: 38 % — ABNORMAL LOW (ref 39.0–52.0)
HCT: 35 % — ABNORMAL LOW (ref 39.0–52.0)
HCT: 38 % — ABNORMAL LOW (ref 39.0–52.0)
Hemoglobin: 11.2 g/dL — ABNORMAL LOW (ref 13.0–17.0)
Hemoglobin: 11.9 g/dL — ABNORMAL LOW (ref 13.0–17.0)
Hemoglobin: 12.2 g/dL — ABNORMAL LOW (ref 13.0–17.0)
Hemoglobin: 11.9 g/dL — ABNORMAL LOW (ref 13.0–17.0)
Hemoglobin: 12.9 g/dL — ABNORMAL LOW (ref 13.0–17.0)
Hemoglobin: 12.6 g/dL — ABNORMAL LOW (ref 13.0–17.0)
Hemoglobin: 9.5 g/dL — ABNORMAL LOW (ref 13.0–17.0)
Hemoglobin: 12.2 g/dL — ABNORMAL LOW (ref 13.0–17.0)
Hemoglobin: 12.9 g/dL — ABNORMAL LOW (ref 13.0–17.0)
Hemoglobin: 11.9 g/dL — ABNORMAL LOW (ref 13.0–17.0)
Hemoglobin: 12.9 g/dL — ABNORMAL LOW (ref 13.0–17.0)
Potassium: 3.7 mmol/L (ref 3.5–5.1)
Potassium: 3.7 mmol/L (ref 3.5–5.1)
Potassium: 3.8 mmol/L (ref 3.5–5.1)
Potassium: 3.8 mmol/L (ref 3.5–5.1)
Potassium: 3.8 mmol/L (ref 3.5–5.1)
Potassium: 3 mmol/L — ABNORMAL LOW (ref 3.5–5.1)
Potassium: 3.8 mmol/L (ref 3.5–5.1)
Potassium: 3.8 mmol/L (ref 3.5–5.1)
Potassium: 3.8 mmol/L (ref 3.5–5.1)
Potassium: 3.8 mmol/L (ref 3.5–5.1)
Potassium: 3.8 mmol/L (ref 3.5–5.1)
Sodium: 139 mmol/L (ref 135–145)
Sodium: 138 mmol/L (ref 135–145)
Sodium: 140 mmol/L (ref 135–145)
Sodium: 137 mmol/L (ref 135–145)
Sodium: 139 mmol/L (ref 135–145)
Sodium: 108 mmol/L — CL (ref 135–145)
Sodium: 139 mmol/L (ref 135–145)
Sodium: 137 mmol/L (ref 135–145)
Sodium: 139 mmol/L (ref 135–145)
Sodium: 138 mmol/L (ref 135–145)
Sodium: 139 mmol/L (ref 135–145)
TCO2: 28 mmol/L (ref 22–32)
TCO2: 28 mmol/L (ref 22–32)
TCO2: 28 mmol/L (ref 22–32)
TCO2: 28 mmol/L (ref 22–32)
TCO2: 30 mmol/L (ref 22–32)
TCO2: 24 mmol/L (ref 22–32)
TCO2: 28 mmol/L (ref 22–32)
TCO2: 28 mmol/L (ref 22–32)
TCO2: 29 mmol/L (ref 22–32)
TCO2: 26 mmol/L (ref 22–32)
TCO2: 29 mmol/L (ref 22–32)

## 2019-05-09 LAB — BASIC METABOLIC PANEL
Anion gap: 18 — ABNORMAL HIGH (ref 5–15)
BUN: 42 mg/dL — ABNORMAL HIGH (ref 6–20)
CO2: 25 mmol/L (ref 22–32)
Calcium: 9.4 mg/dL (ref 8.9–10.3)
Chloride: 94 mmol/L — ABNORMAL LOW (ref 98–111)
Creatinine, Ser: 3.28 mg/dL — ABNORMAL HIGH (ref 0.61–1.24)
GFR calc Af Amer: 27 mL/min — ABNORMAL LOW (ref >60)
GFR calc non Af Amer: 23 mL/min — ABNORMAL LOW (ref >60)
Glucose, Bld: 321 mg/dL — ABNORMAL HIGH (ref 70–99)
Potassium: 3.6 mmol/L (ref 3.5–5.1)
Sodium: 137 mmol/L (ref 135–145)

## 2019-05-09 LAB — GLUCOSE, CAPILLARY
Glucose-Capillary: 206 mg/dL — ABNORMAL HIGH (ref 70–99)
Glucose-Capillary: 227 mg/dL — ABNORMAL HIGH (ref 70–99)
Glucose-Capillary: 249 mg/dL — ABNORMAL HIGH (ref 70–99)
Glucose-Capillary: 251 mg/dL — ABNORMAL HIGH (ref 70–99)
Glucose-Capillary: 301 mg/dL — ABNORMAL HIGH (ref 70–99)
Glucose-Capillary: 301 mg/dL — ABNORMAL HIGH (ref 70–99)

## 2019-05-09 LAB — CBC
HCT: 32.3 % — ABNORMAL LOW (ref 39.0–52.0)
Hemoglobin: 9.9 g/dL — ABNORMAL LOW (ref 13.0–17.0)
MCH: 30.2 pg (ref 26.0–34.0)
MCHC: 30.7 g/dL (ref 30.0–36.0)
MCV: 98.5 fL (ref 80.0–100.0)
Platelets: 419 10*3/uL — ABNORMAL HIGH (ref 150–400)
RBC: 3.28 MIL/uL — ABNORMAL LOW (ref 4.22–5.81)
RDW: 14.1 % (ref 11.5–15.5)
WBC: 11.1 10*3/uL — ABNORMAL HIGH (ref 4.0–10.5)
nRBC: 0 % (ref 0.0–0.2)

## 2019-05-09 LAB — COMPREHENSIVE METABOLIC PANEL
ALT: 78 U/L — ABNORMAL HIGH (ref 0–44)
AST: 44 U/L — ABNORMAL HIGH (ref 15–41)
Albumin: 2.6 g/dL — ABNORMAL LOW (ref 3.5–5.0)
Alkaline Phosphatase: 164 U/L — ABNORMAL HIGH (ref 38–126)
Anion gap: 19 — ABNORMAL HIGH (ref 5–15)
BUN: 39 mg/dL — ABNORMAL HIGH (ref 6–20)
CO2: 26 mmol/L (ref 22–32)
Calcium: 9.7 mg/dL (ref 8.9–10.3)
Chloride: 92 mmol/L — ABNORMAL LOW (ref 98–111)
Creatinine, Ser: 3.32 mg/dL — ABNORMAL HIGH (ref 0.61–1.24)
GFR calc Af Amer: 27 mL/min — ABNORMAL LOW (ref >60)
GFR calc non Af Amer: 23 mL/min — ABNORMAL LOW (ref >60)
Glucose, Bld: 286 mg/dL — ABNORMAL HIGH (ref 70–99)
Potassium: 3.9 mmol/L (ref 3.5–5.1)
Sodium: 137 mmol/L (ref 135–145)
Total Bilirubin: 1.4 mg/dL — ABNORMAL HIGH (ref 0.3–1.2)
Total Protein: 8.2 g/dL — ABNORMAL HIGH (ref 6.5–8.1)

## 2019-05-09 LAB — RENAL FUNCTION PANEL
Albumin: 2.7 g/dL — ABNORMAL LOW (ref 3.5–5.0)
Anion gap: 19 — ABNORMAL HIGH (ref 5–15)
BUN: 36 mg/dL — ABNORMAL HIGH (ref 6–20)
CO2: 26 mmol/L (ref 22–32)
Calcium: 10.1 mg/dL (ref 8.9–10.3)
Chloride: 92 mmol/L — ABNORMAL LOW (ref 98–111)
Creatinine, Ser: 3.14 mg/dL — ABNORMAL HIGH (ref 0.61–1.24)
GFR calc Af Amer: 28 mL/min — ABNORMAL LOW (ref >60)
GFR calc non Af Amer: 25 mL/min — ABNORMAL LOW (ref >60)
Glucose, Bld: 302 mg/dL — ABNORMAL HIGH (ref 70–99)
Phosphorus: 3.3 mg/dL (ref 2.5–4.6)
Potassium: 3.7 mmol/L (ref 3.5–5.1)
Sodium: 137 mmol/L (ref 135–145)

## 2019-05-09 LAB — PHOSPHORUS: Phosphorus: 3.9 mg/dL (ref 2.5–4.6)

## 2019-05-09 LAB — COOXEMETRY PANEL
Carboxyhemoglobin: 2.1 % — ABNORMAL HIGH (ref 0.5–1.5)
Methemoglobin: 1.2 % (ref 0.0–1.5)
O2 Saturation: 66.1 %
Total hemoglobin: 10.3 g/dL — ABNORMAL LOW (ref 12.0–16.0)

## 2019-05-09 LAB — MAGNESIUM: Magnesium: 1.9 mg/dL (ref 1.7–2.4)

## 2019-05-09 LAB — APTT: aPTT: 58 seconds — ABNORMAL HIGH (ref 24–36)

## 2019-05-09 MED ORDER — PIPERACILLIN-TAZOBACTAM 3.375 G IVPB 30 MIN
3.3750 g | Freq: Four times a day (QID) | INTRAVENOUS | Status: AC
  Administered 2019-05-09 – 2019-05-10 (×6): 3.375 g via INTRAVENOUS
  Filled 2019-05-09 (×11): qty 50

## 2019-05-09 MED ORDER — AMIODARONE IV BOLUS ONLY 150 MG/100ML
150.0000 mg | Freq: Once | INTRAVENOUS | Status: AC
  Administered 2019-05-09: 23:00:00 150 mg via INTRAVENOUS
  Filled 2019-05-09: qty 100

## 2019-05-09 MED ORDER — PHENYLEPHRINE HCL-NACL 10-0.9 MG/250ML-% IV SOLN
0.0000 ug/min | INTRAVENOUS | Status: DC
  Administered 2019-05-09: 23:00:00 25 ug/min via INTRAVENOUS
  Filled 2019-05-09: qty 250

## 2019-05-09 MED ORDER — DEXTROSE 5 % IV SOLN
Status: DC
  Filled 2019-05-09 (×7): qty 1500

## 2019-05-09 MED ORDER — QUETIAPINE FUMARATE 25 MG PO TABS
25.0000 mg | ORAL_TABLET | Freq: Every day | ORAL | Status: DC
  Filled 2019-05-09: qty 1

## 2019-05-09 MED ORDER — INSULIN ASPART 100 UNIT/ML ~~LOC~~ SOLN: 15.0000 [IU] | SUBCUTANEOUS | Status: DC

## 2019-05-09 MED ORDER — IVABRADINE HCL 5 MG PO TABS
2.5000 mg | ORAL_TABLET | Freq: Two times a day (BID) | ORAL | Status: DC
  Administered 2019-05-09 – 2019-05-10 (×2): 2.5 mg
  Filled 2019-05-09 (×2): qty 1

## 2019-05-09 MED ORDER — NOREPINEPHRINE 4 MG/250ML-% IV SOLN
INTRAVENOUS | Status: AC
  Filled 2019-05-09: qty 250

## 2019-05-09 MED ORDER — IVABRADINE HCL 5 MG PO TABS
2.5000 mg | ORAL_TABLET | Freq: Two times a day (BID) | ORAL | Status: DC
  Filled 2019-05-09 (×2): qty 1

## 2019-05-09 MED ORDER — PHENYLEPHRINE CONCENTRATED 100MG/250ML (0.4 MG/ML) INFUSION SIMPLE
0.0000 ug/min | INTRAVENOUS | Status: DC
  Administered 2019-05-10: 125 ug/min via INTRAVENOUS
  Filled 2019-05-09: qty 250

## 2019-05-09 MED ORDER — ADENOSINE 6 MG/2ML IV SOLN
INTRAVENOUS | Status: AC
  Administered 2019-05-09: 6 mg
  Filled 2019-05-09: qty 6

## 2019-05-09 MED ORDER — INSULIN ASPART 100 UNIT/ML ~~LOC~~ SOLN
10.0000 [IU] | SUBCUTANEOUS | Status: DC
  Administered 2019-05-09 – 2019-05-17 (×48): 10 [IU] via SUBCUTANEOUS

## 2019-05-09 MED ORDER — INSULIN DETEMIR 100 UNIT/ML ~~LOC~~ SOLN
25.0000 [IU] | Freq: Two times a day (BID) | SUBCUTANEOUS | Status: DC
  Administered 2019-05-09 – 2019-05-10 (×3): 25 [IU] via SUBCUTANEOUS
  Filled 2019-05-09 (×5): qty 0.25

## 2019-05-09 MED ORDER — QUETIAPINE FUMARATE 25 MG PO TABS
25.0000 mg | ORAL_TABLET | Freq: Every day | ORAL | Status: DC
  Administered 2019-05-09 – 2019-05-18 (×11): 25 mg
  Filled 2019-05-09 (×10): qty 1

## 2019-05-09 NOTE — Progress Notes (Signed)
Pharmacy Antibiotic Note  Carlos Brewer. is a 34 y.o. male admitted on 05/06/2019 as a code stroke and completed a course of antibiotics for sepsis.   Now with a fever and leukocytosis, so Pharmacy has been consulted to restart vancomycin and Zosyn for sepsis.  Patient on CRRT. Cultures negative to date.  Will d/c vancomycin and continue Zosyn for 5 days total.  Plan: D/C Vancomycin Continue Zosyn 3.375gm IV Q6H, 30 min infusion for 6 more doses to complete course  Height: 6\' 1"  (185.4 cm) Weight: 234 lb 9.1 oz (106.4 kg) IBW/kg (Calculated) : 79.9  Temp (24hrs), Avg:98 F (36.7 C), Min:97.7 F (36.5 C), Max:98.3 F (36.8 C)  Recent Labs  Lab 05/05/19 0500 05/05/19 0501 05/06/19 0500 05/06/19 0807 05/07/19 0246 05/07/19 0343 05/08/19 0307 05/08/19 0816 05/08/19 2013 05/08/19 2021 05/09/19 0500 05/09/19 0631 05/09/19 0637  WBC 16.3*  --  15.6*  --  14.0*  --  18.3*  --   --   --  11.1*  --   --   CREATININE 3.05*   < > 6.23*   < > 4.78*   < > 3.62*   < > 2.70* 3.20* 3.32* 2.30* 3.10*   < > = values in this interval not displayed.    Estimated Creatinine Clearance: 43 mL/min (A) (by C-G formula based on SCr of 3.1 mg/dL (H)).    Allergies  Allergen Reactions  . Heparin Other (See Comments)    Heparin induced thrombocytopenia. 2/15 HIT OD 1.692.     Vanc 2/6>>2/9, restart 2/19 >> Zosyn 2/6x1, restart 2/19 >> Merrem 2/6 >> 2/12  2/5 covid / flu - negative 2/5 MRSA PCR - positive 2/6 UCx - negative 2/6 TA - negative 2/6 BCx - negative 2/16 TA - negative 2/19 BCx - ngtd  Alanda Slim, PharmD, [facility] Clinical Pharmacist Please see AMION for all Pharmacists' Contact Phone Numbers 05/09/2019, 10:32 AM

## 2019-05-09 NOTE — Progress Notes (Signed)
Advanced Heart Failure Rounding Note   Subjective:     Remains on vent through trach. Continues w/ significant neurological deficits but able to follow some commands (able to close eyes when instructed).  Remains anuric and CRRT dependent.   On milrinone 0.25 mcg. Co-ox 66%. Off NE. MAPs in 70s   Tachycardic on tele in the 120s, persistent most of the day. I gave adenosine 10m IV with slowing of sinus tach and then sped back up     Objective:   Weight Range:  Vital Signs:   Temp:  [97.6 F (36.4 C)-98.2 F (36.8 C)] 98.2 F (36.8 C) (02/22 1600) Pulse Rate:  [99-126] 126 (02/22 1800) Resp:  [17-29] 18 (02/22 1800) BP: (85-130)/(51-89) 95/71 (02/22 1800) SpO2:  [96 %-100 %] 99 % (02/22 1800) FiO2 (%):  [28 %-40 %] 28 % (02/22 1600) Weight:  [106.4 kg] 106.4 kg (02/22 0500) Last BM Date: 05/08/19  Weight change: Filed Weights   05/07/19 0500 05/08/19 0500 05/09/19 0500  Weight: 115.4 kg 111.1 kg 106.4 kg    Intake/Output:   Intake/Output Summary (Last 24 hours) at 05/09/2019 1813 Last data filed at 05/09/2019 1800 Gross per 24 hour  Intake 31687.44 ml  Output 5976 ml  Net 25711.44 ml     Physical Exam: General:  Awake on trach. Will track and follow some simple commands HEENT: normal Neck: supple. + trach collar  Carotids 2+ bilat; no bruits. No lymphadenopathy or thryomegaly appreciated. Cor: PMI nondisplaced. Regular tachy + s3 Lungs: clear Abdomen: obese soft, nontender, nondistended. No hepatosplenomegaly. No bruits or masses. Good bowel sounds. Extremities: no cyanosis, clubbing, rash, edema Neuro:Awake on trach. Will track and follow some simple commands  Telemetry: On 2/20 developed several hours of atrial tach. Since that time has had sinus tach with progressively increasing HRs. Now ST 120s. Personally reviewed   Labs: Basic Metabolic Panel: Recent Labs  Lab 05/03/19 0358 05/03/19 1645 05/04/19 0427 05/04/19 1739 05/05/19 0500  05/05/19 0501 05/06/19 0500 05/06/19 0807 05/07/19 0206 05/07/19 0246 05/07/19 2022 05/07/19 2349 05/08/19 0307 05/08/19 0816 05/08/19 1608 05/08/19 1609 05/08/19 2021 05/09/19 0500 05/09/19 0631 05/09/19 0637 05/09/19 1655  NA 135   < > 134*   < > 134*   < > 134*   < >  --    < > 134*   < > 138   < > 138   < > 139 137 140 138 137  K 4.0   < > 4.6   < > 4.6   < > 7.3*   < >  --    < > 4.2   < > 4.0   < > 3.9   < > 3.7 3.9 3.8 3.7 3.7  CL 102   < > 102   < > 98   < > 97*   < >  --    < > 94*   < > 94*   < > 93*   < > 98 92* 95* 94* 92*  CO2 22   < > 20*   < > 19*   < > 20*   < >  --    < > 24  --  26  --  27  --   --  26  --   --  26  GLUCOSE 175*   < > 138*   < > 209*   < > 171*   < >  --    < >  280*   < > 280*   < > 204*   < > 253* 286* 274* 260* 302*  BUN 62*   < > 59*   < > 63*   < > 110*   < >  --    < > 64*   < > 53*   < > 44*   < > 40* 39* 31* 38* 36*  CREATININE 3.02*   < > 2.72*   < > 3.05*   < > 6.23*   < >  --    < > 3.77*   < > 3.62*   < > 3.35*   < > 3.20* 3.32* 2.30* 3.10* 3.14*  CALCIUM 8.8*   < > 8.7*   < > 7.8*   < > 9.2   < >  --    < > 8.5*   < > 9.1  --  10.0  --   --  9.7  --   --  10.1  MG 2.7*  --  2.7*  --  2.3  --  2.3  --  2.2  --   --   --   --   --   --   --   --   --   --   --   --   PHOS 4.2   < > 4.0   < > 4.4   < > 7.2*   < >  --    < > 4.2  --  4.4  --  3.8  --   --  3.9  --   --  3.3   < > = values in this interval not displayed.    Liver Function Tests: Recent Labs  Lab 05/04/19 0453 05/04/19 1739 05/07/19 2022 05/08/19 0307 05/08/19 1608 05/09/19 0500 05/09/19 1655  AST 57*  --   --   --   --  44*  --   ALT 192*  --   --   --   --  78*  --   ALKPHOS 117  --   --   --   --  164*  --   BILITOT 1.6*  --   --   --   --  1.4*  --   PROT 7.4  --   --   --   --  8.2*  --   ALBUMIN 2.5*   < > 2.3* 2.3* 2.4* 2.6* 2.7*   < > = values in this interval not displayed.   No results for input(s): LIPASE, AMYLASE in the last 168 hours. No results  for input(s): AMMONIA in the last 168 hours.  CBC: Recent Labs  Lab 05/05/19 0500 05/05/19 0501 05/06/19 0500 05/06/19 1558 05/07/19 0246 05/07/19 0343 05/08/19 0307 05/08/19 0816 05/08/19 2013 05/08/19 2021 05/09/19 0500 05/09/19 0631 05/09/19 0637  WBC 16.3*  --  15.6*  --  14.0*  --  18.3*  --   --   --  11.1*  --   --   HGB 11.2*   < > 9.7*   < > 8.4*   < > 9.3*   < > 11.2* 11.2* 9.9* 12.2* 11.9*  HCT 35.2*   < > 31.1*   < > 26.4*   < > 29.2*   < > 33.0* 33.0* 32.3* 36.0* 35.0*  MCV 96.7  --  97.2  --  95.3  --  97.0  --   --   --  98.5  --   --  PLT 123*  --  165  --  194  --  362  --   --   --  419*  --   --    < > = values in this interval not displayed.    Cardiac Enzymes: No results for input(s): CKTOTAL, CKMB, CKMBINDEX, TROPONINI in the last 168 hours.  BNP: BNP (last 3 results) Recent Labs    12/13/18 1750 12/15/18 0405 04/23/19 0945  BNP 1,156.1* 1,161.0* 2,909.9*    ProBNP (last 3 results) No results for input(s): PROBNP in the last 8760 hours.    Other results:  Imaging: DG Chest Port 1 View  Result Date: 05/08/2019 CLINICAL DATA:  Acute respiratory failure. EXAM: PORTABLE CHEST 1 VIEW COMPARISON:  05/05/2019 FINDINGS: Tracheostomy tube tip at the thoracic inlet. Weighted enteric tube tip below the diaphragm not included in the field of view. Right central line in the SVC. Cardiomegaly is not significantly changed. Patchy bilateral airspace disease is not significantly changed from prior. No pneumothorax. No large pleural effusion. IMPRESSION: 1. Unchanged patchy bilateral airspace disease. Stable cardiomegaly. 2. Stable support apparatus. Electronically Signed   By: Keith Rake M.D.   On: 05/08/2019 03:58      Medications:     Scheduled Medications: . sodium chloride   Intravenous Once  . amiodarone  200 mg Per Tube BID  . aspirin  81 mg Per Tube Daily  . atorvastatin  40 mg Per Tube q1800  . chlorhexidine gluconate (MEDLINE KIT)  15  mL Mouth Rinse BID  . Chlorhexidine Gluconate Cloth  6 each Topical Q0600  . feeding supplement (PRO-STAT SUGAR FREE 64)  60 mL Per Tube TID  . insulin aspart  0-20 Units Subcutaneous Q4H  . insulin aspart  10 Units Subcutaneous Q4H  . insulin detemir  25 Units Subcutaneous BID  . mouth rinse  15 mL Mouth Rinse 10 times per day  . pantoprazole sodium  40 mg Per Tube Q1200  . QUEtiapine  25 mg Per Tube QHS  . sodium chloride flush  10-40 mL Intracatheter Q12H  . sodium chloride flush  3 mL Intravenous Q12H     Infusions: . sodium chloride Stopped (05/04/19 0200)  . sodium chloride    . bivalirudin (ANGIOMAX) infusion 0.5 mg/mL (Non-ACS indications) 0.06 mg/kg/hr (05/09/19 1800)  . calcium gluconate infusion for CRRT 60 mL/hr at 05/09/19 1800  . feeding supplement (VITAL AF 1.2 CAL) 50 mL/hr at 05/09/19 1800  . milrinone 0.25 mcg/kg/min (05/09/19 1804)  . norepinephrine Stopped (05/09/19 1752)  . piperacillin-tazobactam 3.375 g (05/09/19 1806)  . prismasol B22GK 4/0 500 mL/hr at 05/07/19 0513  . prismasol B22GK 4/0 300 mL/hr at 05/09/19 0310  . prismasol B22GK 4/0 1,500 mL/hr at 05/09/19 1142  . sodium citrate 2 %/dextrose 2.5% solution 3000 mL 260 mL/hr at 05/09/19 1703     PRN Medications:  sodium chloride, sodium chloride, bisacodyl, docusate, fentaNYL, fentaNYL (SUBLIMAZE) injection, labetalol **AND** [DISCONTINUED] niCARDipine, midazolam, sodium chloride, sodium chloride flush, sodium chloride flush   Assessment/Plan:   1. Acute on chronic systolic HF due to NICM-> cardiogenic shock - Echo EF < 20% - now on milrinone 0.25 with co-ox 66% - will wean milrinone to 0.125 2. AKI -> ESRD - started on CRRT 04/23/19 - remains anuric  - volume status improved 3. Acute CVA with severe residual deficits due to extensive R cerebellar, midbrain, R MCA and L MCA infarcts  - likely cardioembolic - s/p clot extraction - mental status improving slowly  but still quite compromised 4.  Acute hypoxic respiratory failure - s/p trach  - now tolerating for > 24 hours 5. Wide complex tachycardia - tele reviewed extensively and discussed with EP - On 2/20 developed several hours of atrial tach. Since that time has had sinus tach with progressively increasing HRs. Now ST 120s.  I gave adenosine 12m IV with slowing of sinus tach and then sped back up - I do not see any evidence of VT currently.  - Will add low-dose ivabradine 2.5 bid. Continue amio for now given atrial tach  Remains critically ill but somewhat improved from last week. I am concerned that given his severe HF he will not be able to tolerate iHD at this point - so I remain hopeful that renal function will begin to recover soon. If no renal recovery then we will do our best to adjust oral meds and see if we can find a way for him to tolerate iHD but I would not be optimistic.   For today, will wean milrinone to 0.125. Add ivabradine.   CRITICAL CARE Performed by: BGlori Bickers Total critical care time: 45 minutes  Critical care time was exclusive of separately billable procedures and treating other patients.  Critical care was necessary to treat or prevent imminent or life-threatening deterioration.  Critical care was time spent personally by me (independent of midlevel providers or residents) on the following activities: development of treatment plan with patient and/or surrogate as well as nursing, discussions with consultants, evaluation of patient's response to treatment, examination of patient, obtaining history from patient or surrogate, ordering and performing treatments and interventions, ordering and review of laboratory studies, ordering and review of radiographic studies, pulse oximetry and re-evaluation of patient's condition.   Length of Stay: 17   DGlori BickersMD 05/09/2019, 6:13 PM  Advanced Heart Failure Team Pager [phone](M-F; 7a - 4p)  Please contact [email or web address] for  night-coverage after hours (4p -7a ) and weekends on [email or web address]

## 2019-05-09 NOTE — Progress Notes (Signed)
eLink Physician-Brief Progress Note Patient Name: Carlos Brewer. DOB: [birth year] MRN: [id number]   Date of Service  05/09/2019  HPI/Events of Note  Notified that patient has not been able to sleep day and night for the past few days. He was given Fentanyl 100 and Versed 2 mg the night before without success  eICU Interventions  Will give a trial of Seroquel 25 x 1. May need further increase in dosage     Intervention Category Minor Interventions: Routine modifications to care plan (e.g. PRN medications for pain, fever)  Shona Needles Hailea Eaglin 05/09/2019, 3:44 AM

## 2019-05-09 NOTE — Progress Notes (Signed)
Assisted tele visit to patient with family member.  Suman Trivedi P, RN  

## 2019-05-09 NOTE — Progress Notes (Addendum)
STROKE TEAM PROGRESS NOTE   INTERVAL HISTORY Patient mom at bedside.  Patient no acute event overnight, still able to tracking bilaterally but not following commands.  However, right leg spontaneous movement much frequent than before, with withdrawal in right arm and left leg.  BP on the lower end today, however, off pressors.   OBJECTIVE Vitals:   05/09/19 0600 05/09/19 0800 05/09/19 0815 05/09/19 0900  BP: 110/61 107/61 107/61 (!) 85/54  Pulse: (!) 125 (!) 117 99 (!) 120  Resp: (!) 23 (!) 25 (!) 21 (!) 21  Temp:   97.7 F (36.5 C)   TempSrc:      SpO2: 100% 98%  96%  Weight:      Height:       CBC:  Recent Labs  Lab 05/08/19 0307 05/08/19 0816 05/09/19 0500 05/09/19 0500 05/09/19 0631 05/09/19 0637  WBC 18.3*  --  11.1*  --   --   --   HGB 9.3*   < > 9.9*   < > 12.2* 11.9*  HCT 29.2*   < > 32.3*   < > 36.0* 35.0*  MCV 97.0  --  98.5  --   --   --   PLT 362  --  419*  --   --   --    < > = values in this interval not displayed.   Basic Metabolic Panel:  Recent Labs  Lab 05/06/19 0500 05/06/19 0807 05/07/19 0206 05/07/19 0246 05/08/19 1608 05/08/19 1609 05/09/19 0500 05/09/19 0500 05/09/19 0631 05/09/19 0637  NA 134*   < >  --    < > 138   < > 137   < > 140 138  K 7.3*   < >  --    < > 3.9   < > 3.9   < > 3.8 3.7  CL 97*   < >  --    < > 93*   < > 92*   < > 95* 94*  CO2 20*   < >  --    < > 27  --  26  --   --   --   GLUCOSE 171*   < >  --    < > 204*   < > 286*   < > 274* 260*  BUN 110*   < >  --    < > 44*   < > 39*   < > 31* 38*  CREATININE 6.23*   < >  --    < > 3.35*   < > 3.32*   < > 2.30* 3.10*  CALCIUM 9.2   < >  --    < > 10.0  --  9.7  --   --   --   MG 2.3  --  2.2  --   --   --   --   --   --   --   PHOS 7.2*   < >  --    < > 3.8  --  3.9  --   --   --    < > = values in this interval not displayed.    IMAGING CT HEAD CODE STROKE WO CONTRAST 05/09/2019 1. Asymmetric hyperdensity of a Left MCA branch in the Sylvian fissure suspicious for  emergent large vessel occlusion in the setting.  2. No associated acute cortically based infarct, ASPECTS 10. No acute hemorrhage.  3. Chronic right frontal operculum encephalomalacia.   CT  Code Stroke CTA Head W/WO contrast CT Code Stroke CTA Neck W/WO contrast 05/07/2019 1. Positive for emergent large vessel occlusion at the Left MCA bifurcation. There is reconstitution of the dominant posterior division branch.  2. Elsewhere negative CTA head and neck. Dominant right vertebral artery which supplies the basilar. Fetal type PCA origins.   CT CEREBRAL PERFUSION W CONTRAST 05/03/2019 1. CTP detects evidence of Left MCA territory oligemia concordant with the CTA findings.  2. No infarct core detected with standard CBF <30%.  3. 60 mL of penumbra detected with standard T-max > 6s.   CT HEAD WO CONTRAST 04/23/2019 Small region of loss of gray-white differentiation in the left frontal operculum consistent with a fairly small area of acute infarction compared to the previous perfusion abnormality. No hemorrhage or mass effect. Areas of low-density in the cerebellum that I favor are artifactual. There did not seem to be any abnormalities in that region on the previous examinations.   MRI / MRA Head WO Contrast  04/28/2019 Extensive acute infarction in the right cerebellum with swelling and petechial blood products. Acute infarction of the superior cerebellar peduncle on the right, with some infarction in the mid brain. Extensive acute infarction in the right temporal lobe and insula. Small acute infarctions in the frontal operculum region and right parietal region. Infarction affecting the right posterior basal ganglia and caudate nucleus. Petechial blood products present without frank hematoma. Acute infarction in the left insula and frontal operculum. Petechial blood products present without frank hematoma. Small punctate acute infarctions higher in the left frontoparietal junction region. No large or  medium vessel occlusion seen presently. Restoration of flow in the left MCA. Flow appears to be present in posterior circulation main vessels and branch vessels presently.   DG CHEST PORT 1 VIEW 05/08/2019 1. Unchanged patchy bilateral airspace disease. Stable cardiomegaly. 2. Stable support apparatus. 05/05/2019 1. Interval placement of a tracheostomy tube with tip projecting just beneath the level of the clavicular heads. 2. Additional support apparatus as described. 3. Patchy airspace disease throughout both lungs is overall similar as compared to prior exam 05/03/2019 with possible slight interval progression in the right upper to mid lung field. 4. Stable cardiomegaly. 05/03/2019 1. Coarsening patchy opacity in the left lung and right lung base, asymmetric edema versus infection. 2. Stable cardiomegaly. 3. Stable support apparatus. 04/30/2019 Improvement of bilateral airspace disease. 04/29/2019 Patchy opacities consistent with the given clinical history. Tubes and lines as described.  04/27/2019 Perhaps slightly improved aeration at the left lung base. Otherwise no change in the bilateral patchy airspace and interstitial changes, likely associated with small effusions. Lines and tubes unchanged. 04/26/2019 0854 1. Lines and tubes as described above. 2. Cardiomegaly with basilar airspace disease, likely associated with effusions. 3. Interstitial and alveolar opacities elsewhere may represent a combination of infection and edema and are little changed. 04/25/2019 0946 Overall improved aeration from the prior exam although persistent opacities and small effusions are noted. 04/23/19 1636 1. Interval placement of a right neck vascular sheath, tip positioned over the right brachiocephalic vein. 2.  Other support apparatus in unchanged position. 3. Slight interval increase in diffuse bilateral interstitial and heterogeneous airspace opacity, consistent with multifocal infection, edema, and/or ARDS. 4.  Cardiomegaly. 04/23/2019 0757 Increasing patchy bilateral airspace opacities which may represent edema or infection. 04/19/2019 1958 1. Interval placement of neck vascular catheter, tip positioned near the superior cavoatrial junction.  2. Interval retraction of endotracheal tube, now positioned just above the thoracic inlet, approximately 6.5 cm  above the carina.  3. Interval placement of esophagogastric tube, tip and side port below the diaphragm. 4. Stable cardiomegaly and diffuse bilateral interstitial airspace opacity, likely edema.  04/26/2019 0841 1. Endotracheal tube tip noted just above the carina. Proximal repositioning of 2-3 should be considered. 2. Cardiomegaly again noted. Diffuse bilateral pulmonary infiltrates/edema. Small left pleural effusion.   ECHOCARDIOGRAM COMPLETE 05/02/2019 1. Left ventricular ejection fraction, by visual estimation, is <20%. The left ventricle has severely decreased function. There is no left ventricular hypertrophy.   2. Apex is not well-visualized. In setting of stroke with severe LV systolic dysfunction, recommend repeat TTE with contrast to rule out LV apical thrombus   3. Severely dilated left ventricular internal cavity size.   4. Left ventricular diastolic parameters are consistent with Grade III diastolic dysfunction (restrictive).   5. Elevated left atrial pressure.   6. The left ventricle demonstrates global hypokinesis.   7. Global right ventricle has normal systolic function.The right ventricular size is mildly enlarged.   8. Left atrial size was moderately dilated.   9. Right atrial size was normal.  10. The mitral valve is normal in structure. Mild mitral valve regurgitation.  11. The tricuspid valve is normal in structure. Tricuspid valve regurgitation is trivial.  12. The aortic valve is normal in structure. Aortic valve regurgitation is not visualized. No evidence of aortic valve sclerosis or stenosis.  13. The pulmonic valve was not well  visualized. Pulmonic valve regurgitation is not visualized.   Interventional Neuro Radiology - Cerebral Angiogram with Intervention 04/20/2019 S/P Lt common carotid arteriogram followed by complete revascularization of occluded LT MCA sup division mid M2 seg with x 1 pass with 74mx 40 mm solitaire X ret river device and penumbra aspiration with TICI 3 revascularization  ECG - SR rate 93 BPM. (See cardiology reading for complete details)  PHYSICAL EXAM   Temp:  [97.7 F (36.5 C)-98.3 F (36.8 C)] 97.7 F (36.5 C) (02/22 0815) Pulse Rate:  [99-125] 120 (02/22 0900) Resp:  [19-28] 21 (02/22 0900) BP: (85-133)/(54-94) 85/54 (02/22 0900) SpO2:  [96 %-100 %] 96 % (02/22 0900) FiO2 (%):  [28 %-40 %] 28 % (02/22 0815) Weight:  [106.4 kg] 106.4 kg (02/22 0500)  General - obese young af[place], intubated off sedation.  Ophthalmologic - fundi not visualized due to noncooperation.  Cardiovascular - Regular rhythm and rate.  Neuro - on trach collar, eyes are spontaneously open.  He moves his neck from side to side responding to voice. Aable to have bilateral gaze and track, however, he does not follow commands except close/open eyes but still inconsistent. Able to blinking to visual threat bilaterally. PERRL. Corneal reflex present, gag and cough present. Facial seems to be symmetric although bilaterally weakness.  Tongue protrusion not cooperative. Spontaneous movement of RLE against gravity. On pain stimulation, mild withdraw of RUE and LLE, no movement of LUE. Bilateral positive babinski.  Sensation, coordination and gait not tested.   ASSESSMENT/PLAN Mr. LDonterius Filley is a 34y.o. male with history of NICM EF 15-20%, CHF, DM, Obesity, tobacco use, OSA, and hx of cocaine use presenting with aphasia and right sided weakness. The patient received IV t-PA on Friday 05/14/2019 at 4:45 AM. Mechanical thrombectomy left M2.  Stroke:  Extensive R cerebellar, midbrain, R MCA and L  MCA infarcts s/p tPA & IR with left M2 TICI3 reperfusion - embolic - likely secondary to severe cardiomyopathy with low EF and LV thrombus  Code Stroke CT Head -  left MCA hyperdensity.   CTA H&N - emergent large vessel occlusion at the Left MCA bifurcation.   CT Perfusion - 60 mL of penumbra detected.   CT head repeat - Small region of loss of gray-white differentiation in the left frontal operculum   MRI head - 04/28/2019 - extensive R cerebellar infarct w/ petechial hemorrhage. R cerebellar peduncle and midbrain infarct. Extensive R temporal lobe and insula infarct w/ R frontal operculum and parietal infarcts. R posterior basal ganglia and caudate head infarct w/ petechial hemorrhage. L insula and L frontal operculum infarcts w/ petechial hemorrhage. Small L frontoparietal jxn infarcts.   MRA head restoration L MCA flow. Flow in posterior circulation.   2D Echo - EF < 20%   2D with contrast - possible LV apical thrombus 0.8 x 0.6  Sars Corona Virus 2 - negative  LDL - 203  HgbA1c - 6.5  VTE prophylaxis - angiomax IV  aspirin 81 mg daily prior to admission, IV heparin discontinued d/t HIT, now on angiomax. As platelet count improving, also on ASA 81 in addition to Angiomax.     Therapy recommendations:  pending - order when able to participate   Disposition:  Pending  Encephalopathy  multifactorial  Not following commands or moving extremities on request  MRI extensive R and L brain and R cerebellar infarcts  Prognosis is guarded  Mother is next of kin. Ongoing GOC discussion, mom remains hopeful  HIT  On heparin IV for LV thrombus  Platelet [id number]->94->362->419   Heparin discontinued  HIT panel - HIT ab elevated. SRA pending     On Angiomax IV  CCM on board  Platelet monitoring  Acute Hypoxemic Respiratory failure  Intubated  Off sedation now   CCM on board  CXR - 05/08/19 - stable patchy B airspace dz   Prn precedex as BP will  allow  S/p trach 2/18  On trach collar  Cardiogenic vs. Septic shock  Fever TMax 100.9->afebrile  Leukocytosis, WBCs 9.4 ->14.0->18.3->11.1   CXR - 2/21  Stable Patchy airspace disease throughout both lungs    On Meropenum 04/23/19>>04/29/19  Vancomycin 2/6>>2/9, 05/06/19 >>05/09/19  Zosyn 2/6 x 1, 05/06/19>> 05/10/19 (5days total)  Merrem 2/6>>2/12  On hydrocortisone->off  U/A neg, Urine culture no growth   Blood cultures 2/6 & 2/19 neg   resp Cx 2/16 normal flora  Off pressor  CCM plans low dose coreg, dig if able to be off pressors   LV thrombus Severe nonischemic cardiomyopathy Chronic systolic CHF  2D Echo - EF < 20%   2D with contrast - possible LV apical thrombus 0.8 x 0.6  On heparin IV -> angiomax IV    On amiodarone gtt for wide-complex tachycardia and hypotension -> changed to po  Add beta blocker once BP allows  Cardiology on board  Polymorphic V-tach / SVT [place]  Elevated troponin Prolonged QT  On amiodarone drip -> now changed to po  Troponin trending down 16274->14226->2409   Cardiology on board  May consider cardioversion if needed  Episodes WCT 2:00 AM today -> Amiodarone  Tachycardia 117->120  AKI  Creatinine - 3.32->2.3->3.10   BUN 39->31->38   Renal consult 04/23/19  Still minimal urinary output, no signs of renal recovery  On CVVHDF - unable to come off d/t hypercatabolic state, hope stop soon  Plan transition to intermittent dialysis once able  Hypotension w/ shock Hx Hypertension  Home BP meds: Coreg, Entresto  Off pressors  SBP goal  >110  Stable  Shock  liver d/t hypoperfusion, improving  GI on board  AST/ALT - >10000/10132 ->... ->57/192 (05/04/19) >44/78 (05/09/19)    INR 3.1->1.8->1.2  CK 602  Hyperlipidemia  Home Lipid lowering medication: Lipitor 40 mg daily  LDL 203, goal < 70  Ok to add back: Lipitor 40 mg daily   Will not use high intensity statin at this time given hx shock  liver  Continue statin at discharge  Diabetes type II, uncontrolled  Home diabetic meds: insulin  Current diabetic meds: SS insulin, Levemir 25 bid, novolog 10u q4h  HgbA1c 6.5, goal < 7.0  CBG monitoring   SSI  DB RB following  Dysphagia . Secondary to stroke . NPO . On tube feeding @ 50 . Speech on board  Tobacco abuse  Current smoker  Mom confirmed tobacco use  Smoking cessation counseling will be provided  Other Stroke Risk Factors  ETOH use, advised to drink no more than 1 alcoholic beverage per day.  Hx cocaine abuse, UDS neg   Obesity, Body mass index is 30.95 kg/m., recommend weight loss, diet and exercise as appropriate   Obstructive sleep apnea - uses Cpap  Substance Abuse Hx of cocaine use  Other Active Problems  Code status - Full code  Hyperkalemia - 4.0 resolved  Metabolic acidosis, resolved  Depression on prozac  Hyperglycemia   Anemia - Hb - 9.9->12.2->11.9   Insomnia. seroquel 25 q hs  Hospital day # 17  No new recommendations from neurology at this time. Neurology will follow peripherally. Feel free to call with any questions. I had long discussion with mom at bedside, updated pt current condition, treatment plan and potential prognosis, and answered all the questions. Mom expressed understanding and appreciation. I also discussed with Dr. Lamonte Sakai from [facility]    Rosalin Hawking, MD PhD Stroke Neurology 05/09/2019 9:33 AM  To contact Stroke Continuity provider, please refer to [email or web address] After hours, contact General Neurology

## 2019-05-09 NOTE — Progress Notes (Signed)
Nephrology Progress Note:  Patient ID: Carlos Brewer., male   DOB: 1985-09-17, 34 y.o.   MRN: [id number]   S: has been on milrinone and off pressors.  Seen on CRRT. Procedure supervised. Had 5.6 liters UF with CRRT charted over 2/21. Anuric.  Noted HIT positive.  Goal of net net 100-200 /hr currently.  On calcium.    Review of systems: unable to obtain 2/2 pt AMS/nonverbal   O:BP 107/61   Pulse 99   Temp 97.7 F (36.5 C)   Resp (!) 21   Ht 6' 1"  (1.854 m)   Wt 106.4 kg   SpO2 98%   BMI 30.95 kg/m   Intake/Output Summary (Last 24 hours) at 05/09/2019 0845 Last data filed at 05/09/2019 0800 Gross per 24 hour  Intake 29681.03 ml  Output 6132 ml  Net 23549.03 ml   Intake/Output: I/O last 3 completed shifts: In: 5840.4 [I.V.:3577.5; NG/GT:1774.2; IV Piggyback:488.8] Out: 9518 [ACZYS:0630; Stool:1100]    Intake/Output this shift:  Total I/O In: 25878.3 [I.V.:99.9; NG/GT:50; IV Piggyback:25728.4] Out: 204 [Other:204] Weight change: -4.7 kg  Physical exam:   Gen: awake in bed, trach collar, nonverbal and not following commands but is tracking HEENT - trach in place CVS: tachy; S1S2 no rub Resp: mechanical breath sounds  Abd: soft/ND Ext: trace to no edema Access: right femoral nontunneled catheter  Recent Labs  Lab 05/04/19 0453 05/04/19 1739 05/06/19 0500 05/06/19 0807 05/06/19 1558 05/06/19 1559 05/07/19 0246 05/07/19 0343 05/07/19 0630 05/07/19 0821 05/07/19 2022 05/07/19 2349 05/08/19 0307 05/08/19 0816 05/08/19 1608 05/08/19 1609 05/08/19 1614 05/08/19 2013 05/08/19 2021 05/09/19 0500 05/09/19 0631  NA  --    < > 134*   < > 135  135   < > 136   < > 134*   < > 134*   < > 138   < > 138 136 140 140 139 137 140  K  --    < > 7.3*   < > 4.6  4.6   < > 4.2   < > 4.3   < > 4.2   < > 4.0   < > 3.9 3.8 3.8 4.0 3.7 3.9 3.8  CL  --    < > 97*   < > 99  100   < > 97*   < > 94*   < > 94*   < > 94*   < > 93* 95* 95* 94* 98 92* 95*  CO2  --    < > 20*    < > 19*  --  22  --  23  --  24  --  26  --  27  --   --   --   --  26  --   GLUCOSE  --    < > 171*   < > 222*  225*   < > 253*   < > 304*   < > 280*   < > 280*   < > 204* 253* 221* 276* 253* 286* 274*  BUN  --    < > 110*   < > 121*  115*   < > 94*   < > 86*   < > 64*   < > 53*   < > 44* 40* 37* 37* 40* 39* 31*  CREATININE  --    < > 6.23*   < > 6.30*  6.07*   < > 4.78*   < > 4.36*   < >  3.77*   < > 3.62*   < > 3.35* 3.40* 2.80* 2.70* 3.20* 3.32* 2.30*  ALBUMIN 2.5*   < > 2.3*  --  2.0*  --   --   --  2.1*  --  2.3*  --  2.3*  --  2.4*  --   --   --   --  2.6*  --   CALCIUM  --    < > 9.2   < > 8.3*  --  7.8*  --  7.9*  --  8.5*  --  9.1  --  10.0  --   --   --   --  9.7  --   PHOS  --    < > 7.2*  --  8.1*  --   --   --  4.5  --  4.2  --  4.4  --  3.8  --   --   --   --  3.9  --   AST 57*  --   --   --   --   --   --   --   --   --   --   --   --   --   --   --   --   --   --  44*  --   ALT 192*  --   --   --   --   --   --   --   --   --   --   --   --   --   --   --   --   --   --  78*  --    < > = values in this interval not displayed.   Liver Function Tests: Recent Labs  Lab 05/04/19 0453 05/04/19 1739 05/08/19 0307 05/08/19 1608 05/09/19 0500  AST 57*  --   --   --  44*  ALT 192*  --   --   --  78*  ALKPHOS 117  --   --   --  164*  BILITOT 1.6*  --   --   --  1.4*  PROT 7.4  --   --   --  8.2*  ALBUMIN 2.5*   < > 2.3* 2.4* 2.6*   < > = values in this interval not displayed.   CBC: Recent Labs  Lab 05/05/19 0500 05/05/19 0501 05/06/19 0500 05/06/19 1558 05/07/19 0246 05/07/19 0343 05/08/19 0307 05/08/19 0816 05/08/19 2021 05/09/19 0500 05/09/19 0631  WBC 16.3*   < > 15.6*  --  14.0*  --  18.3*  --   --  11.1*  --   HGB 11.2*   < > 9.7*   < > 8.4*   < > 9.3*   < > 11.2* 9.9* 12.2*  HCT 35.2*   < > 31.1*   < > 26.4*   < > 29.2*   < > 33.0* 32.3* 36.0*  MCV 96.7  --  97.2  --  95.3  --  97.0  --   --  98.5  --   PLT 123*   < > 165  --  194  --  362  --   --  419*   --    < > = values in this interval not displayed.   Cardiac Enzymes: No results for input(s): CKTOTAL, CKMB, CKMBINDEX, TROPONINI in the last 168 hours. CBG: Recent Labs  Lab 05/08/19 1605  05/08/19 2007 05/08/19 2345 05/09/19 0343 05/09/19 0747  GLUCAP 203* 239* 200* 206* 227*    Studies/Results: DG Chest Port 1 View  Result Date: 05/08/2019 CLINICAL DATA:  Acute respiratory failure. EXAM: PORTABLE CHEST 1 VIEW COMPARISON:  05/05/2019 FINDINGS: Tracheostomy tube tip at the thoracic inlet. Weighted enteric tube tip below the diaphragm not included in the field of view. Right central line in the SVC. Cardiomegaly is not significantly changed. Patchy bilateral airspace disease is not significantly changed from prior. No pneumothorax. No large pleural effusion. IMPRESSION: 1. Unchanged patchy bilateral airspace disease. Stable cardiomegaly. 2. Stable support apparatus. Electronically Signed   By: Keith Rake M.D.   On: 05/08/2019 03:58   . sodium chloride   Intravenous Once  . amiodarone  200 mg Per Tube BID  . aspirin  81 mg Per Tube Daily  . atorvastatin  40 mg Per Tube q1800  . chlorhexidine gluconate (MEDLINE KIT)  15 mL Mouth Rinse BID  . Chlorhexidine Gluconate Cloth  6 each Topical Q0600  . feeding supplement (PRO-STAT SUGAR FREE 64)  60 mL Per Tube TID  . fentaNYL (SUBLIMAZE) injection  50 mcg Intravenous Once  . insulin aspart  0-20 Units Subcutaneous Q4H  . insulin aspart  10 Units Subcutaneous Q4H  . insulin detemir  25 Units Subcutaneous Daily  . mouth rinse  15 mL Mouth Rinse 10 times per day  . pantoprazole sodium  40 mg Per Tube Q1200  . QUEtiapine  25 mg Per Tube QHS  . sodium chloride flush  10-40 mL Intracatheter Q12H  . sodium chloride flush  3 mL Intravenous Q12H    BMET    Component Value Date/Time   NA 140 05/09/2019 0631   K 3.8 05/09/2019 0631   CL 95 (L) 05/09/2019 0631   CO2 26 05/09/2019 0500   GLUCOSE 274 (H) 05/09/2019 0631   BUN 31 (H)  05/09/2019 0631   CREATININE 2.30 (H) 05/09/2019 0631   CALCIUM 9.7 05/09/2019 0500   GFRNONAA 23 (L) 05/09/2019 0500   GFRAA 27 (L) 05/09/2019 0500   CBC    Component Value Date/Time   WBC 11.1 (H) 05/09/2019 0500   RBC 3.28 (L) 05/09/2019 0500   HGB 12.2 (L) 05/09/2019 0631   HCT 36.0 (L) 05/09/2019 0631   PLT 419 (H) 05/09/2019 0500   MCV 98.5 05/09/2019 0500   MCH 30.2 05/09/2019 0500   MCHC 30.7 05/09/2019 0500   RDW 14.1 05/09/2019 0500   LYMPHSABS 0.8 04/24/2019 0843   MONOABS 0.5 04/24/2019 0843   EOSABS 0.4 04/24/2019 0843   BASOSABS 0.0 04/24/2019 0843   Assessment/Plan:  1. AKIpresumably due to ATN with ischemic/nephrotoxic insults worsened by cardiogenic shock. Started on CRRT 04/23/19. Remains oliguric and dialysis dependentbut was able to come off of pressors last night. 1. Could not tolerate stopping CVVHD likely due to hypercatabolic state 2. Resumed CVVHD 05/06/19 due to hyperkalemia which has resolved 3. Using 4K/0Ca baths for all fluids due to use of citrate.systemic and post-filter calcium being monitored per protocol; on infusion 4. May try to stop CVVHDF soon if remains off of pressors for another 24 hours 5. Reduce UF goal to net neg 50 to 100 an hour as tolerated 2. Cardiogenic shock-  Hx milrinone due to development of a fib with rvr and wide complex tachycardia.   1. Previous nephrologist discussed case with Dr. Haroldine Laws who felt that he has a known severe NICM with EF <20% and did not feel that he  would tolerate IHD but would continue to support with milrinone and midodrine/pressors as needed to help promote renal perfusion.  3. Acute stroke- presumed embolic with bilateral infarcts and LV thrombus- received tpa on admission 4. Vascular access-right fem HD catheter placed 05/06/19 5. SVT/polymorphic VT- per Cardiology 6. Acute hypoxic respiratory failure- s/p trach  7. Thrombocytopenia with HIT - heparin discontinued. HIT Ab+.Platelets  improving. 8. Disposition- overall prognosis is poor with ongoing RRT (previously felt could not tolerate stopping CVVHD due to hypercatabolic state), trach, as well as cardiogenic shock/cardiomyopathy. Recommend palliative care consult to help set goals/limits of care.    Claudia Desanctis, MD 05/09/2019

## 2019-05-09 NOTE — Progress Notes (Signed)
PT Cancellation Note  Patient Details Name: Carlos Brewer. MRN: [id number] DOB: 1985-12-08   Cancelled Treatment:    Reason Eval/Treat Not Completed: Medical issues which prohibited therapy(CRRT R femoral)  Birdie Hopes, DPT, PT Acute Rehab [phone]   Birdie Hopes 05/09/2019, 12:19 PM

## 2019-05-09 NOTE — Progress Notes (Signed)
Inpatient Diabetes Program Recommendations  AACE/ADA: New Consensus Statement on Inpatient Glycemic Control (2015)  Target Ranges:  Prepandial:   less than 140 mg/dL      Peak postprandial:   less than 180 mg/dL (1-2 hours)      Critically ill patients:  140 - 180 mg/dL   Lab Results  Component Value Date   GLUCAP 227 (H) 05/09/2019   HGBA1C 6.5 (H) 04/23/2019    Review of Glycemic Control  Results for BRETON, ROMINE (MRN [id number]) as of 05/09/2019 10:54  Ref. Range 05/08/2019 03:31 05/08/2019 08:13 05/08/2019 11:15 05/08/2019 16:05 05/08/2019 20:07 05/08/2019 23:45 05/09/2019 03:43 05/09/2019 07:47  Glucose-Capillary Latest Ref Range: 70 - 99 mg/dL 285 (H)  15 units Novolg   270 (H)  15 units Novolog 372 (H)  30 units Novolog 203 (H)  17 units Novolog 239 (H)  17 units Novolog 200 (H)  14 units Novolog 206 (H)  17 units Novolog 227 (H)  17 units Novolog     Diabetes history: DM2  Current orders for Inpatient glycemic control: Levemir 25 units daily + Novolog 0-20  units Q4H + Novolog 15 units Q4H tube feed coverage  Inpatient Diabetes Program Recommendations:    Noted that patient has received a total of 110 units of Novolog over the last 24 hrs for correction and tube feed coverage  -Please consider increasing Levemir to 25 units BID and decreasing Novolog 10 units Q4H (stop if tube feeds are held or discontinued)   Thank you, Reche Dixon, RN, BSN Diabetes Coordinator Inpatient Diabetes Program [phone] (team pager from 8a-5p)

## 2019-05-09 NOTE — Progress Notes (Signed)
NAME:  Carlos Brewer., MRN:  [id number], DOB:  [birth year], LOS: 40 ADMISSION DATE:  04/19/2019, CONSULTATION DATE:  04/30/2019 REFERRING MD:  Dr. Leonel Ramsay, CHIEF COMPLAINT:  Slurred speech  Brief History   34 yo male smoker found to have slurred speech and Rt sided weakness.  CT head showed M2 occlusion.  Ultimately treated with thrombolytic and thrombectomy by IR.  Required intubation for airway protection. Course complicated by septic shock, AKI requiring CRRT and polymorphic VT  Past Medical History  Systolic CHF with non ischemic CM, Cocaine abuse, OSA, DM Hx of CHF (EF 15%)  Hx myocarditis Smoker 1/2 ppd   Significant Hospital Events   2/05 Admit, tPA, IR thrombectomy 2/6 100% on fvent at 2300  Overnight requiring increasing levophed and phenylephrine.    2/6: switched to levophed, epi, started antibiotics, started on CRRT.  2/9  developed wide-complex tachycardia and hypotension, started on amiodarone drip, increase Levophed drip 2/15 drop in platelets, heparin stopped, bival started 2/17 Hypotensive and back on Levophed 2/18 trach, lines changed 2/19 started milrinone , fever 103 2/20 Episode of wide-complex tachycardia yesterday, changed from Levophed to vasopressin 2/22 stopping vanc. Still off pressors. Still on CRRT.  Consults:  Neuro IR  Procedures:  ETT 2/05 >> 2/18 [facility] CVL 2/5 >>2/18  RT [facility] CVL 2/18 >> RIJ HD cath 2/6 >>2/18  RT fem HD 2/19 >>  2/5-2/6:  TPA given at 428 am (total of 90 Mg) 520 am went to IR  S/P Lt common carotid arteriogram followed by complete revascularization of occluded LT MCA sup division mid M2 seg with x 1 pass with 80mx 40 mm solitaire X ret river device and penumbra aspiration with TICI 3 revascularization   Significant Diagnostic Tests:  CT angio head/neck 2/05 >> occlusion of Lt MCA bifurcation Echo 2/05 >> EF less than 20%, cannot rule out apical thrombus MRI brain 2/11 > extensive acute infarction of multiple  areas without large or medium vessel occlusion Echocardiogram 2/8 Left ventricular ejection fraction, by estimation, is <20%. The left  ventricle has severely decreased function. The left ventricle demonstrates  global hypokinesis. The left ventricular internal cavity size was severely  dilated. Possible small 0.8 x  0.6 cm apical thrombus. Somewhat subtle finding, could confirm with  cardiac MRI. However, if patient has had CVA, would be reasonable to  anticoagulate given severity of LV dysfunction if no contraindications to  anticoagulation.   Micro Data:  SARS CoV2 PCR 2/05 >> negative Influenza PCR 2/05 >> negative Urine 2/6 >> ng resp 2/6 >> nml flora BC 2/6 >>ng resp 2/16 >> [facility] >>  Antimicrobials:  mero 2/6 -2/12 Zosyn 2/6 , 2/19 >> vanc 2/6 >> 2/9 , 2/19 >>2/22  Interim history/subjective:  Looks comfortable on atc  Objective   Blood pressure (Abnormal) 85/54, pulse (Abnormal) 120, temperature 97.7 F (36.5 C), resp. rate (Abnormal) 21, height 6' 1"  (1.854 m), weight 106.4 kg, SpO2 96 %.    Vent Mode: Stand-by FiO2 (%):  [28 %-40 %] 28 %   Intake/Output Summary (Last 24 hours) at 05/09/2019 0953 Last data filed at 05/09/2019 0900 Gross per 24 hour  Intake 29972.11 ml  Output 6049 ml  Net 23923.11 ml   Filed Weights   05/07/19 0500 05/08/19 0500 05/09/19 0500  Weight: 115.4 kg 111.1 kg 106.4 kg    Examination:  General this is a 34year old black male resting in bed no distress on ATC HENT NCAT. 6 cuffed trach  unremarkable Pulm equal chest rise. No accessory use. occ rhonchi w/ decreased breath sounds in bases. Currently on 28% ATC Card wide complex. Still 90-100s no MRG abd not tender tol tube feeds Neuro awake. Slow to follow commands w/ right hand.  gu anuric  Ext warm and dry HD cath right fem unremarkable   Resolved Hospital Problem list     Assessment & Plan:   Left MCA CVA Multiple other infarcts noted on MRI 0/09-QZRAQT  embolic Acute metabolic encephalopathy Plan Supportive care Rehab efforts   Circulatory shock.  Initially felt Septic shock vs cardiogenic shock -resolved 2/15, back on pressors 2/17 Pressor requirements seem to be related to sedation and cardiogenic rather than sepsis Plan Cont milrinone for now Cont tele   Acute systolic HF w/ NICM Plan Cont digoxin and lipitor Add low dose coreg if/when we can get him off pressors and midodrine Volume removal per nephrology  Holding zaroxalyn, entresto, aldactone and toresemide Advanced heart failure team following  Left ventricle apical thrombus non ischemic cardiomyopathy with systolic congestive heart failure Polymorphic VT on 2/9, WCT 2/19 Plan Cont amiodarone and anticoagulation w/ bivalirudin  Cont tele   Fever. Leukocytosis and possible HCAP. afeb but is on CRRT -CVL changed -sputum neg -Cultures pending Plan abx day 4. Stop vanc. Will complete one more day of zosyn (5d course)  Acute hypoxemic respiratory failure now tracheostomy dependent after stroke Tolerating ATC Plan Cont ATC as tolerated Keep vent as stand bye for another 24 hrs VAP bundle  Am cxr.    Acute kidney injury. Still anuric  Plan Cont CRRT per nephro  Serial chemistries    Thrombocytopenia - HIT +, await SRA, platelets recovered on bivalirudin Plan Avoid heparin products    DM type II poorly controlled. Plan Cont ssi and levemir 25 daily and increased his novolog to 15 q 4 w/ssi Adjust basal dosing  Goal 140-180  Hx of cocaine abuse. depression. - not active per UDS  Plan Cont prozac    Best practice:  Diet: NPO,  TFs DVT prophylaxis: SCDs GI prophylaxis: protonix Mobility: bed rest Code Status: full code Disposition: ICU-->the issue will be if he can come off CRRT and tolerate traditional HD w/ his CM  Family -fianc [name] 2/21 My cct 57 minutes  Erick Colace ACNP-BC [place] Pager # [phone] OR #  [phone] if no answer

## 2019-05-09 NOTE — Progress Notes (Signed)
eLink Physician-Brief Progress Note Patient Name: Coye Deraad. DOB: 1985-04-11 MRN: [id number]   Date of Service  05/09/2019  HPI/Events of Note  EKG rhythm change - Going in and out of new EKG rhythm. LVEF = 15-20%. HR = 121 with widened  Complex. BP = 63/32 with MAP = 42.   eICU Interventions  Will order: 1. 12 Lead EKG STAT. 2. Amiodarone 150 mg IV over 10 minutes now.  3. BMP and Mg++ level STAT. 4. Phenylephrine IV infusion. Titrate to MAP >= 65. 5.Bedside nurse to contact cardiology for further management of heart rhythm in this complex patient with known LVEF = 15-20%.     Intervention Category Major Interventions: Arrhythmia - evaluation and management  Annel Zunker Cornelia Copa 05/09/2019, 10:17 PM

## 2019-05-09 NOTE — Progress Notes (Signed)
ANTICOAGULATION CONSULT NOTE  Pharmacy Consult:  Bivalirudin Indication: stroke 2/5, LV thrombus 2/8, HIT 2/15   Allergies  Allergen Reactions  . Heparin Other (See Comments)    Heparin induced thrombocytopenia. 2/15 HIT OD 1.692.     Patient Measurements: Height: 6\' 1"  (185.4 cm) Weight: 234 lb 9.1 oz (106.4 kg) IBW/kg (Calculated) : 79.9   Vital Signs: Temp: 97.9 F (36.6 C) (02/22 0400) Temp Source: Axillary (02/22 0400) BP: 110/61 (02/22 0600) Pulse Rate: 125 (02/22 0600)  Labs: Recent Labs    05/07/19 0246 05/07/19 0343 05/08/19 0307 05/08/19 0816 05/08/19 2021 05/08/19 2021 05/09/19 0500 05/09/19 0631  HGB 8.4*   < > 9.3*   < > 11.2*   < > 9.9* 12.2*  HCT 26.4*   < > 29.2*   < > 33.0*  --  32.3* 36.0*  PLT 194  --  362  --   --   --  419*  --   APTT 56*  --  50*  --   --   --  58*  --   CREATININE 4.78*   < > 3.62*   < > 3.20*  --  3.32* 2.30*   < > = values in this interval not displayed.    Estimated Creatinine Clearance: 57.9 mL/min (A) (by C-G formula based on SCr of 2.3 mg/dL (H)).  Assessment: 34 yr old male with left MCA infarct - received TPA and revascularization with IR on 2/5. On 2/8 found to have small LV apical thrombus on ECHO. Pharmacy consulted to dose IV heparin, which was switched to bivalirudin 2/15 for HIT.  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 SRA pending (drawn 2/16).    APTT therapeutic; no issue with infusion per RN.  No bleeding reported.  Goal of Therapy:  APTT 50- 65 sec  Monitor platelets by anticoagulation protocol: Yes   Plan:  Continue bivalirudin at 0.06 mg/kg/hr (AdjBW) Daily aPTT and CBC F/u SRA  Alanda Slim, PharmD, [facility] Clinical Pharmacist Please see AMION for all Pharmacists' Contact Phone Numbers 05/09/2019, 8:05 AM

## 2019-05-09 NOTE — Evaluation (Signed)
[facility] Speaking Valve - Evaluation Patient Details  Name: Carlos Brewer. MRN: [id number] Date of Birth: 16-Aug-1985  Today's Date: 05/09/2019 Time: [id number] SLP Time Calculation (min) (ACUTE ONLY): 22 min  Past Medical History:  Past Medical History:  Diagnosis Date  . CHF (congestive heart failure) ([place])   . Cocaine abuse ([place])   . HFrEF (heart failure with reduced ejection fraction) ([place])    a. 10/2016 Echo: EF 15-20%, Gr2 DD, mildly dil LA/RA.  Marland Kitchen History of cardiac cath    a. 10/2016 Cath: LM nl, LAD min irregs, LCX nl, RCA nl, EF 15%.  . Morbid obesity ([place])   . Myocarditis ([place])    a. 10/2016 Admit w/ CHF and trop elevation; b. 10/2016 Echo: EF 15-20%; c. 10/2016 Cath: Min irregs in LAD otw nl cors, EF 15%, glob HK.  Marland Kitchen NICM (nonischemic cardiomyopathy) ([place])    a. 10/2016 Echo: EF 15-20%, Gr2 DD; b. 10/2016 Cath: Nl cors.  . Palpitations   . Sleep apnea    USES CPAP  . Tobacco abuse    Past Surgical History:  Past Surgical History:  Procedure Laterality Date  . CORONARY ANGIOPLASTY    . IR CT HEAD LTD  04/26/2019  . IR PATIENT EVAL TECH 0-60 MINS  04/23/2019  . IR PERCUTANEOUS ART THROMBECTOMY/INFUSION INTRACRANIAL INC DIAG ANGIO  04/19/2019  . NO PAST SURGERIES    . RADIOLOGY WITH ANESTHESIA N/A 05/03/2019   Procedure: RADIOLOGY WITH ANESTHESIA;  Surgeon: Luanne Bras, MD;  Location: [facility];  Service: Radiology;  Laterality: N/A;  . RIGHT/LEFT HEART CATH AND CORONARY ANGIOGRAPHY N/A 10/27/2016   Procedure: RIGHT/LEFT HEART CATH AND CORONARY ANGIOGRAPHY;  Surgeon: Wellington Hampshire, MD;  Location: [place] [facility];  Service: Cardiovascular;  Laterality: N/A;  . WISDOM TOOTH EXTRACTION  2008   HPI:  34 yo male smoker found to have slurred speech and Rt sided weakness.  CT head showed M2 occlusion.  Ultimately treated with thrombolytic and thrombectomy by IR.  Required intubation for airway protection. Course complicated by septic shock, AKI requiring CRRT and  polymorphic VT. Intubated 2/5-2/18.   Assessment / Plan / Recommendation Clinical Impression   Pt encountered in bed, alert/cooperative. Cuff deflation was tolerated for ~20 minutes, with x1 initial cough productive of tracheal secretions. PMV was trialed and tolerated for a total for ~15 minutes, with no distress or back pressure as removed. Pt's vitals remained stable t/o. The pt also has receptive/expressive aphasia, which made it difficult for pt to follow commands. He did not attempt to phonate or expectorate secretions orally. SLP utilized music to attempt to initiate vocalizations, but he continued to remain aphonic. Recommend pt to wear PMV intermittently with full supervision via staff. Pt also may benefit from a cognitive-linguistic evaluation to further determine communication deficits.  SLP Visit Diagnosis: Aphonia (R49.1)    SLP Assessment  Patient needs continued Speech Lanaguage Pathology Services    Follow Up Recommendations  Other (comment)(tbd)    Frequency and Duration min 2x/week  2 weeks    PMSV Trial PMSV was placed for: 15 minutes Able to redirect subglottic air through upper airway: Yes Able to Attain Phonation: No attempt to phonate Voice Quality: Aphonic Able to Expectorate Secretions: No attempts Level of Secretion Expectoration with PMSV: Not observed Respirations During Trial: 20 SpO2 During Trial: 99 % Pulse During Trial: 109 Behavior: Alert;Cooperative   Tracheostomy Tube       Vent Dependency  FiO2 (%): 28 %  Cuff Deflation Trial  GO   Aline August, Student SLP Office: [phone]  Tolerated Cuff Deflation: Yes Length of Time for Cuff Deflation Trial: 20 minutes Behavior: Alert;Cooperative        05/09/2019, 3:39 PM

## 2019-05-10 ENCOUNTER — Inpatient Hospital Stay (HOSPITAL_COMMUNITY): Payer: No Typology Code available for payment source

## 2019-05-10 DIAGNOSIS — N170 Acute kidney failure with tubular necrosis: Secondary | ICD-10-CM | POA: Diagnosis not present

## 2019-05-10 DIAGNOSIS — N179 Acute kidney failure, unspecified: Secondary | ICD-10-CM | POA: Diagnosis not present

## 2019-05-10 DIAGNOSIS — I639 Cerebral infarction, unspecified: Secondary | ICD-10-CM | POA: Diagnosis not present

## 2019-05-10 DIAGNOSIS — R570 Cardiogenic shock: Secondary | ICD-10-CM | POA: Diagnosis not present

## 2019-05-10 DIAGNOSIS — J9600 Acute respiratory failure, unspecified whether with hypoxia or hypercapnia: Secondary | ICD-10-CM | POA: Diagnosis not present

## 2019-05-10 DIAGNOSIS — I5023 Acute on chronic systolic (congestive) heart failure: Secondary | ICD-10-CM | POA: Diagnosis not present

## 2019-05-10 LAB — POCT I-STAT, CHEM 8
BUN: 35 mg/dL — ABNORMAL HIGH (ref 6–20)
BUN: 40 mg/dL — ABNORMAL HIGH (ref 6–20)
BUN: 32 mg/dL — ABNORMAL HIGH (ref 6–20)
BUN: 36 mg/dL — ABNORMAL HIGH (ref 6–20)
BUN: 28 mg/dL — ABNORMAL HIGH (ref 6–20)
BUN: 35 mg/dL — ABNORMAL HIGH (ref 6–20)
BUN: 31 mg/dL — ABNORMAL HIGH (ref 6–20)
BUN: 33 mg/dL — ABNORMAL HIGH (ref 6–20)
BUN: 31 mg/dL — ABNORMAL HIGH (ref 6–20)
BUN: 37 mg/dL — ABNORMAL HIGH (ref 6–20)
BUN: 31 mg/dL — ABNORMAL HIGH (ref 6–20)
BUN: 37 mg/dL — ABNORMAL HIGH (ref 6–20)
BUN: 34 mg/dL — ABNORMAL HIGH (ref 6–20)
BUN: 41 mg/dL — ABNORMAL HIGH (ref 6–20)
BUN: 35 mg/dL — ABNORMAL HIGH (ref 6–20)
BUN: 41 mg/dL — ABNORMAL HIGH (ref 6–20)
Calcium, Ion: 0.48 mmol/L — CL (ref 1.15–1.40)
Calcium, Ion: 0.98 mmol/L — ABNORMAL LOW (ref 1.15–1.40)
Calcium, Ion: 0.4 mmol/L — CL (ref 1.15–1.40)
Calcium, Ion: 1.01 mmol/L — ABNORMAL LOW (ref 1.15–1.40)
Calcium, Ion: 0.41 mmol/L — CL (ref 1.15–1.40)
Calcium, Ion: 1.05 mmol/L — ABNORMAL LOW (ref 1.15–1.40)
Calcium, Ion: 0.47 mmol/L — CL (ref 1.15–1.40)
Calcium, Ion: 1.09 mmol/L — ABNORMAL LOW (ref 1.15–1.40)
Calcium, Ion: 0.46 mmol/L — CL (ref 1.15–1.40)
Calcium, Ion: 1.02 mmol/L — ABNORMAL LOW (ref 1.15–1.40)
Calcium, Ion: 0.46 mmol/L — CL (ref 1.15–1.40)
Calcium, Ion: 1.06 mmol/L — ABNORMAL LOW (ref 1.15–1.40)
Calcium, Ion: 0.45 mmol/L — CL (ref 1.15–1.40)
Calcium, Ion: 1.1 mmol/L — ABNORMAL LOW (ref 1.15–1.40)
Calcium, Ion: 0.44 mmol/L — CL (ref 1.15–1.40)
Calcium, Ion: 1.01 mmol/L — ABNORMAL LOW (ref 1.15–1.40)
Chloride: 93 mmol/L — ABNORMAL LOW (ref 98–111)
Chloride: 95 mmol/L — ABNORMAL LOW (ref 98–111)
Chloride: 93 mmol/L — ABNORMAL LOW (ref 98–111)
Chloride: 95 mmol/L — ABNORMAL LOW (ref 98–111)
Chloride: 95 mmol/L — ABNORMAL LOW (ref 98–111)
Chloride: 97 mmol/L — ABNORMAL LOW (ref 98–111)
Chloride: 94 mmol/L — ABNORMAL LOW (ref 98–111)
Chloride: 98 mmol/L (ref 98–111)
Chloride: 93 mmol/L — ABNORMAL LOW (ref 98–111)
Chloride: 95 mmol/L — ABNORMAL LOW (ref 98–111)
Chloride: 92 mmol/L — ABNORMAL LOW (ref 98–111)
Chloride: 92 mmol/L — ABNORMAL LOW (ref 98–111)
Chloride: 91 mmol/L — ABNORMAL LOW (ref 98–111)
Chloride: 91 mmol/L — ABNORMAL LOW (ref 98–111)
Chloride: 90 mmol/L — ABNORMAL LOW (ref 98–111)
Chloride: 91 mmol/L — ABNORMAL LOW (ref 98–111)
Creatinine, Ser: 2.5 mg/dL — ABNORMAL HIGH (ref 0.61–1.24)
Creatinine, Ser: 3.1 mg/dL — ABNORMAL HIGH (ref 0.61–1.24)
Creatinine, Ser: 2.5 mg/dL — ABNORMAL HIGH (ref 0.61–1.24)
Creatinine, Ser: 3.1 mg/dL — ABNORMAL HIGH (ref 0.61–1.24)
Creatinine, Ser: 2.4 mg/dL — ABNORMAL HIGH (ref 0.61–1.24)
Creatinine, Ser: 3.2 mg/dL — ABNORMAL HIGH (ref 0.61–1.24)
Creatinine, Ser: 2.5 mg/dL — ABNORMAL HIGH (ref 0.61–1.24)
Creatinine, Ser: 3.1 mg/dL — ABNORMAL HIGH (ref 0.61–1.24)
Creatinine, Ser: 2.5 mg/dL — ABNORMAL HIGH (ref 0.61–1.24)
Creatinine, Ser: 3.2 mg/dL — ABNORMAL HIGH (ref 0.61–1.24)
Creatinine, Ser: 2.7 mg/dL — ABNORMAL HIGH (ref 0.61–1.24)
Creatinine, Ser: 3.3 mg/dL — ABNORMAL HIGH (ref 0.61–1.24)
Creatinine, Ser: 2.7 mg/dL — ABNORMAL HIGH (ref 0.61–1.24)
Creatinine, Ser: 3.5 mg/dL — ABNORMAL HIGH (ref 0.61–1.24)
Creatinine, Ser: 2.8 mg/dL — ABNORMAL HIGH (ref 0.61–1.24)
Creatinine, Ser: 3.3 mg/dL — ABNORMAL HIGH (ref 0.61–1.24)
Glucose, Bld: 297 mg/dL — ABNORMAL HIGH (ref 70–99)
Glucose, Bld: 311 mg/dL — ABNORMAL HIGH (ref 70–99)
Glucose, Bld: 217 mg/dL — ABNORMAL HIGH (ref 70–99)
Glucose, Bld: 262 mg/dL — ABNORMAL HIGH (ref 70–99)
Glucose, Bld: 203 mg/dL — ABNORMAL HIGH (ref 70–99)
Glucose, Bld: 242 mg/dL — ABNORMAL HIGH (ref 70–99)
Glucose, Bld: 227 mg/dL — ABNORMAL HIGH (ref 70–99)
Glucose, Bld: 259 mg/dL — ABNORMAL HIGH (ref 70–99)
Glucose, Bld: 302 mg/dL — ABNORMAL HIGH (ref 70–99)
Glucose, Bld: 331 mg/dL — ABNORMAL HIGH (ref 70–99)
Glucose, Bld: 263 mg/dL — ABNORMAL HIGH (ref 70–99)
Glucose, Bld: 289 mg/dL — ABNORMAL HIGH (ref 70–99)
Glucose, Bld: 322 mg/dL — ABNORMAL HIGH (ref 70–99)
Glucose, Bld: 340 mg/dL — ABNORMAL HIGH (ref 70–99)
Glucose, Bld: 280 mg/dL — ABNORMAL HIGH (ref 70–99)
Glucose, Bld: 314 mg/dL — ABNORMAL HIGH (ref 70–99)
HCT: 37 % — ABNORMAL LOW (ref 39.0–52.0)
HCT: 40 % (ref 39.0–52.0)
HCT: 38 % — ABNORMAL LOW (ref 39.0–52.0)
HCT: 41 % (ref 39.0–52.0)
HCT: 38 % — ABNORMAL LOW (ref 39.0–52.0)
HCT: 42 % (ref 39.0–52.0)
HCT: 37 % — ABNORMAL LOW (ref 39.0–52.0)
HCT: 43 % (ref 39.0–52.0)
HCT: 37 % — ABNORMAL LOW (ref 39.0–52.0)
HCT: 41 % (ref 39.0–52.0)
HCT: 39 % (ref 39.0–52.0)
HCT: 42 % (ref 39.0–52.0)
HCT: 39 % (ref 39.0–52.0)
HCT: 41 % (ref 39.0–52.0)
HCT: 39 % (ref 39.0–52.0)
HCT: 44 % (ref 39.0–52.0)
Hemoglobin: 12.6 g/dL — ABNORMAL LOW (ref 13.0–17.0)
Hemoglobin: 13.6 g/dL (ref 13.0–17.0)
Hemoglobin: 12.9 g/dL — ABNORMAL LOW (ref 13.0–17.0)
Hemoglobin: 13.9 g/dL (ref 13.0–17.0)
Hemoglobin: 12.9 g/dL — ABNORMAL LOW (ref 13.0–17.0)
Hemoglobin: 14.3 g/dL (ref 13.0–17.0)
Hemoglobin: 12.6 g/dL — ABNORMAL LOW (ref 13.0–17.0)
Hemoglobin: 14.6 g/dL (ref 13.0–17.0)
Hemoglobin: 12.6 g/dL — ABNORMAL LOW (ref 13.0–17.0)
Hemoglobin: 13.9 g/dL (ref 13.0–17.0)
Hemoglobin: 13.3 g/dL (ref 13.0–17.0)
Hemoglobin: 14.3 g/dL (ref 13.0–17.0)
Hemoglobin: 13.3 g/dL (ref 13.0–17.0)
Hemoglobin: 13.9 g/dL (ref 13.0–17.0)
Hemoglobin: 13.3 g/dL (ref 13.0–17.0)
Hemoglobin: 15 g/dL (ref 13.0–17.0)
Potassium: 3.6 mmol/L (ref 3.5–5.1)
Potassium: 3.8 mmol/L (ref 3.5–5.1)
Potassium: 3.6 mmol/L (ref 3.5–5.1)
Potassium: 3.8 mmol/L (ref 3.5–5.1)
Potassium: 3.7 mmol/L (ref 3.5–5.1)
Potassium: 3.8 mmol/L (ref 3.5–5.1)
Potassium: 3.8 mmol/L (ref 3.5–5.1)
Potassium: 3.9 mmol/L (ref 3.5–5.1)
Potassium: 3.9 mmol/L (ref 3.5–5.1)
Potassium: 4 mmol/L (ref 3.5–5.1)
Potassium: 3.8 mmol/L (ref 3.5–5.1)
Potassium: 3.9 mmol/L (ref 3.5–5.1)
Potassium: 3.7 mmol/L (ref 3.5–5.1)
Potassium: 3.7 mmol/L (ref 3.5–5.1)
Potassium: 3.7 mmol/L (ref 3.5–5.1)
Potassium: 3.7 mmol/L (ref 3.5–5.1)
Sodium: 139 mmol/L (ref 135–145)
Sodium: 139 mmol/L (ref 135–145)
Sodium: 140 mmol/L (ref 135–145)
Sodium: 141 mmol/L (ref 135–145)
Sodium: 139 mmol/L (ref 135–145)
Sodium: 140 mmol/L (ref 135–145)
Sodium: 139 mmol/L (ref 135–145)
Sodium: 140 mmol/L (ref 135–145)
Sodium: 138 mmol/L (ref 135–145)
Sodium: 138 mmol/L (ref 135–145)
Sodium: 138 mmol/L (ref 135–145)
Sodium: 140 mmol/L (ref 135–145)
Sodium: 138 mmol/L (ref 135–145)
Sodium: 138 mmol/L (ref 135–145)
Sodium: 138 mmol/L (ref 135–145)
Sodium: 140 mmol/L (ref 135–145)
TCO2: 27 mmol/L (ref 22–32)
TCO2: 29 mmol/L (ref 22–32)
TCO2: 29 mmol/L (ref 22–32)
TCO2: 30 mmol/L (ref 22–32)
TCO2: 28 mmol/L (ref 22–32)
TCO2: 30 mmol/L (ref 22–32)
TCO2: 27 mmol/L (ref 22–32)
TCO2: 28 mmol/L (ref 22–32)
TCO2: 29 mmol/L (ref 22–32)
TCO2: 30 mmol/L (ref 22–32)
TCO2: 30 mmol/L (ref 22–32)
TCO2: 33 mmol/L — ABNORMAL HIGH (ref 22–32)
TCO2: 32 mmol/L (ref 22–32)
TCO2: 34 mmol/L — ABNORMAL HIGH (ref 22–32)
TCO2: 32 mmol/L (ref 22–32)
TCO2: 33 mmol/L — ABNORMAL HIGH (ref 22–32)

## 2019-05-10 LAB — RENAL FUNCTION PANEL
Albumin: 2.9 g/dL — ABNORMAL LOW (ref 3.5–5.0)
Albumin: 2.9 g/dL — ABNORMAL LOW (ref 3.5–5.0)
Anion gap: 21 — ABNORMAL HIGH (ref 5–15)
Anion gap: 20 — ABNORMAL HIGH (ref 5–15)
BUN: 37 mg/dL — ABNORMAL HIGH (ref 6–20)
BUN: 38 mg/dL — ABNORMAL HIGH (ref 6–20)
CO2: 27 mmol/L (ref 22–32)
CO2: 28 mmol/L (ref 22–32)
Calcium: 10 mg/dL (ref 8.9–10.3)
Calcium: 10.6 mg/dL — ABNORMAL HIGH (ref 8.9–10.3)
Chloride: 92 mmol/L — ABNORMAL LOW (ref 98–111)
Chloride: 91 mmol/L — ABNORMAL LOW (ref 98–111)
Creatinine, Ser: 3.47 mg/dL — ABNORMAL HIGH (ref 0.61–1.24)
Creatinine, Ser: 3.31 mg/dL — ABNORMAL HIGH (ref 0.61–1.24)
GFR calc Af Amer: 25 mL/min — ABNORMAL LOW (ref >60)
GFR calc Af Amer: 27 mL/min — ABNORMAL LOW (ref >60)
GFR calc non Af Amer: 22 mL/min — ABNORMAL LOW (ref >60)
GFR calc non Af Amer: 23 mL/min — ABNORMAL LOW (ref >60)
Glucose, Bld: 195 mg/dL — ABNORMAL HIGH (ref 70–99)
Glucose, Bld: 263 mg/dL — ABNORMAL HIGH (ref 70–99)
Phosphorus: 4.1 mg/dL (ref 2.5–4.6)
Phosphorus: 4.4 mg/dL (ref 2.5–4.6)
Potassium: 3.5 mmol/L (ref 3.5–5.1)
Potassium: 3.8 mmol/L (ref 3.5–5.1)
Sodium: 140 mmol/L (ref 135–145)
Sodium: 139 mmol/L (ref 135–145)

## 2019-05-10 LAB — COOXEMETRY PANEL
Carboxyhemoglobin: 1.9 % — ABNORMAL HIGH (ref 0.5–1.5)
Methemoglobin: 1 % (ref 0.0–1.5)
O2 Saturation: 60 %
Total hemoglobin: 11.7 g/dL — ABNORMAL LOW (ref 12.0–16.0)

## 2019-05-10 LAB — GLUCOSE, CAPILLARY
Glucose-Capillary: 189 mg/dL — ABNORMAL HIGH (ref 70–99)
Glucose-Capillary: 204 mg/dL — ABNORMAL HIGH (ref 70–99)
Glucose-Capillary: 225 mg/dL — ABNORMAL HIGH (ref 70–99)
Glucose-Capillary: 249 mg/dL — ABNORMAL HIGH (ref 70–99)
Glucose-Capillary: 279 mg/dL — ABNORMAL HIGH (ref 70–99)
Glucose-Capillary: 293 mg/dL — ABNORMAL HIGH (ref 70–99)

## 2019-05-10 LAB — CALCIUM, IONIZED: Calcium, Ionized, Serum: 4.5 mg/dL (ref 4.5–5.6)

## 2019-05-10 LAB — APTT: aPTT: 50 seconds — ABNORMAL HIGH (ref 24–36)

## 2019-05-10 MED ORDER — NOREPINEPHRINE 16 MG/250ML-% IV SOLN
0.0000 ug/min | INTRAVENOUS | Status: DC
  Administered 2019-05-10: 8 ug/min via INTRAVENOUS
  Administered 2019-05-11: 14 ug/min via INTRAVENOUS
  Administered 2019-05-12: 20 ug/min via INTRAVENOUS
  Administered 2019-05-13: 14 ug/min via INTRAVENOUS
  Administered 2019-05-15: 12 ug/min via INTRAVENOUS
  Filled 2019-05-10 (×6): qty 250

## 2019-05-10 MED ORDER — NOREPINEPHRINE 4 MG/250ML-% IV SOLN: 0.0000 ug/min | INTRAVENOUS | Status: DC

## 2019-05-10 MED ORDER — AMIODARONE LOAD VIA INFUSION
150.0000 mg | Freq: Once | INTRAVENOUS | Status: AC
  Administered 2019-05-10: 150 mg via INTRAVENOUS
  Filled 2019-05-10: qty 83.34

## 2019-05-10 MED ORDER — VITAL AF 1.2 CAL PO LIQD
1000.0000 mL | ORAL | Status: AC
  Administered 2019-05-10 – 2019-05-19 (×8): 1000 mL
  Filled 2019-05-10 (×2): qty 1000

## 2019-05-10 MED ORDER — AMIODARONE IV BOLUS ONLY 150 MG/100ML: 150.0000 mg | Freq: Once | INTRAVENOUS | Status: DC

## 2019-05-10 MED ORDER — POTASSIUM CHLORIDE 10 MEQ/100ML IV SOLN
10.0000 meq | INTRAVENOUS | Status: AC
  Administered 2019-05-10 (×2): 10 meq via INTRAVENOUS
  Filled 2019-05-10 (×2): qty 100

## 2019-05-10 MED ORDER — IVABRADINE HCL 5 MG PO TABS
5.0000 mg | ORAL_TABLET | Freq: Two times a day (BID) | ORAL | Status: DC
  Administered 2019-05-10 – 2019-05-16 (×12): 5 mg
  Filled 2019-05-10 (×12): qty 1

## 2019-05-10 NOTE — Progress Notes (Signed)
PT Cancellation Note  Patient Details Name: Carlos Brewer. MRN: [id number] DOB: 1985/12/08   Cancelled Treatment:    Reason Eval/Treat Not Completed: Patient not medically ready; RN reports continued reliance on CVVHD with non-tunneled femoral cath.  She reports Conservation officer, historic buildings and mother performing ROM exercises.  Will sign off for PT for right now.  Please order PT again when able to participate.    Reginia Naas 05/10/2019, 12:09 PM  Magda Kiel, [place] [phone] 05/10/2019

## 2019-05-10 NOTE — Progress Notes (Signed)
Nephrology Progress Note:  Patient ID: Carlos Thrun., male   DOB: 13-May-1985, 34 y.o.   MRN: [id number]   S:  On milrinone and now on levo at 3 mcg/min.  Seen on CRRT. Procedure supervised. Right femoral nontunneled catheter.  Has a goal of net neg 50 to 100 an hour.  Had 4.5 liters UF with CRRT charted over 2/22. Anuric.  On calcium and citrate with the CRRT.  Adenosine last night for arrythmia.  Review of systems: unable to obtain 2/2 pt AMS/nonverbal   O:BP 93/71   Pulse (!) 105   Temp 98.6 F (37 C) (Axillary)   Resp (!) 24   Ht 6' 1"  (1.854 m)   Wt 108 kg   SpO2 100%   BMI 31.41 kg/m   Intake/Output Summary (Last 24 hours) at 05/10/2019 0714 Last data filed at 05/10/2019 0700 Gross per 24 hour  Intake 32061.43 ml  Output 5589 ml  Net 26472.43 ml   Intake/Output: I/O last 3 completed shifts: In: 33948.9 [I.V.:3674.7; NG/GT:1910; IV Piggyback:28364.3] Out: 4270 [Other:7327; WCBJS:2831]    Intake/Output this shift:  No intake/output data recorded. Weight change: 1.6 kg  Physical exam:  Gen: awake in bed, trach collar, nonverbal and not following commands but is tracking HEENT - trach in place CVS: tachy; S1S2 no rub; HR 103; 103/74 Resp: mechanical breath sounds  Abd: soft/ND Ext: no edema Access: right femoral nontunneled catheter  Recent Labs  Lab 05/04/19 0453 05/04/19 1739 05/07/19 0630 05/07/19 0821 05/07/19 2022 05/07/19 2349 05/08/19 0307 05/08/19 0816 05/08/19 1608 05/08/19 1609 05/09/19 0500 05/09/19 0631 05/09/19 1655 05/09/19 1657 05/09/19 2215 05/09/19 2252 05/09/19 2253 05/10/19 0108 05/10/19 0109 05/10/19 0420 05/10/19 0504  NA  --    < > 134*   < > 134*   < > 138   < > 138   < > 137   < > 137   < > 137 139 138 139 139 141 140  K  --    < > 4.3   < > 4.2   < > 4.0   < > 3.9   < > 3.9   < > 3.7   < > 3.6 3.8 3.8 3.8 3.6 3.6 3.5  CL  --    < > 94*   < > 94*   < > 94*   < > 93*   < > 92*   < > 92*   < > 94* 95* 96* 93* 95* 93*  92*  CO2  --    < > 23   < > 24  --  26  --  27  --  26  --  26  --  25  --   --   --   --   --  27  GLUCOSE  --    < > 304*   < > 280*   < > 280*   < > 204*   < > 286*   < > 302*   < > 321* 318* 315* 311* 297* 262* 195*  BUN  --    < > 86*   < > 64*   < > 53*   < > 44*   < > 39*   < > 36*   < > 42* 32* 40* 35* 40* 32* 37*  CREATININE  --    < > 4.36*   < > 3.77*   < > 3.62*   < > 3.35*   < >  3.32*   < > 3.14*   < > 3.28* 2.40* 3.30* 2.50* 3.10* 2.50* 3.47*  ALBUMIN 2.5*   < > 2.1*  --  2.3*  --  2.3*  --  2.4*  --  2.6*  --  2.7*  --   --   --   --   --   --   --  2.9*  CALCIUM  --    < > 7.9*   < > 8.5*  --  9.1  --  10.0  --  9.7  --  10.1  --  9.4  --   --   --   --   --  10.0  PHOS  --    < > 4.5  --  4.2  --  4.4  --  3.8  --  3.9  --  3.3  --   --   --   --   --   --   --  4.1  AST 57*  --   --   --   --   --   --   --   --   --  44*  --   --   --   --   --   --   --   --   --   --   ALT 192*  --   --   --   --   --   --   --   --   --  78*  --   --   --   --   --   --   --   --   --   --    < > = values in this interval not displayed.   Liver Function Tests: Recent Labs  Lab 05/04/19 0453 05/04/19 1739 05/09/19 0500 05/09/19 1655 05/10/19 0504  AST 57*  --  44*  --   --   ALT 192*  --  78*  --   --   ALKPHOS 117  --  164*  --   --   BILITOT 1.6*  --  1.4*  --   --   PROT 7.4  --  8.2*  --   --   ALBUMIN 2.5*   < > 2.6* 2.7* 2.9*   < > = values in this interval not displayed.   CBC: Recent Labs  Lab 05/05/19 0500 05/05/19 0501 05/06/19 0500 05/06/19 1558 05/07/19 0246 05/07/19 0343 05/08/19 0307 05/08/19 0816 05/09/19 0500 05/09/19 0631 05/10/19 0108 05/10/19 0109 05/10/19 0420  WBC 16.3*   < > 15.6*  --  14.0*  --  18.3*  --  11.1*  --   --   --   --   HGB 11.2*   < > 9.7*   < > 8.4*   < > 9.3*   < > 9.9*   < > 13.6 12.6* 13.9  HCT 35.2*   < > 31.1*   < > 26.4*   < > 29.2*   < > 32.3*   < > 40.0 37.0* 41.0  MCV 96.7  --  97.2  --  95.3  --  97.0  --  98.5  --    --   --   --   PLT 123*   < > 165  --  194  --  362  --  419*  --   --   --   --    < > =  values in this interval not displayed.   Cardiac Enzymes: No results for input(s): CKTOTAL, CKMB, CKMBINDEX, TROPONINI in the last 168 hours. CBG: Recent Labs  Lab 05/09/19 1142 05/09/19 1521 05/09/19 1943 05/09/19 2337 05/10/19 0337  GLUCAP 249* 251* 301* 301* 204*    Studies/Results: No results found. . sodium chloride   Intravenous Once  . amiodarone  200 mg Per Tube BID  . aspirin  81 mg Per Tube Daily  . atorvastatin  40 mg Per Tube q1800  . chlorhexidine gluconate (MEDLINE KIT)  15 mL Mouth Rinse BID  . Chlorhexidine Gluconate Cloth  6 each Topical Q0600  . feeding supplement (PRO-STAT SUGAR FREE 64)  60 mL Per Tube TID  . insulin aspart  0-20 Units Subcutaneous Q4H  . insulin aspart  10 Units Subcutaneous Q4H  . insulin detemir  25 Units Subcutaneous BID  . ivabradine  2.5 mg Per Tube BID WC  . mouth rinse  15 mL Mouth Rinse 10 times per day  . pantoprazole sodium  40 mg Per Tube Q1200  . QUEtiapine  25 mg Per Tube QHS  . sodium chloride flush  10-40 mL Intracatheter Q12H  . sodium chloride flush  3 mL Intravenous Q12H    BMET    Component Value Date/Time   NA 140 05/10/2019 0504   K 3.5 05/10/2019 0504   CL 92 (L) 05/10/2019 0504   CO2 27 05/10/2019 0504   GLUCOSE 195 (H) 05/10/2019 0504   BUN 37 (H) 05/10/2019 0504   CREATININE 3.47 (H) 05/10/2019 0504   CALCIUM 10.0 05/10/2019 0504   GFRNONAA 22 (L) 05/10/2019 0504   GFRAA 25 (L) 05/10/2019 0504   CBC    Component Value Date/Time   WBC 11.1 (H) 05/09/2019 0500   RBC 3.28 (L) 05/09/2019 0500   HGB 13.9 05/10/2019 0420   HCT 41.0 05/10/2019 0420   PLT 419 (H) 05/09/2019 0500   MCV 98.5 05/09/2019 0500   MCH 30.2 05/09/2019 0500   MCHC 30.7 05/09/2019 0500   RDW 14.1 05/09/2019 0500   LYMPHSABS 0.8 04/24/2019 0843   MONOABS 0.5 04/24/2019 0843   EOSABS 0.4 04/24/2019 0843   BASOSABS 0.0 04/24/2019 0843    Assessment/Plan:  1. AKIpresumably due to ATN with ischemic/nephrotoxic insults worsened by cardiogenic shock. Started on CRRT 04/23/19.  1. Resumed CVVHD 05/06/19 due to hyperkalemia which has resolved 2. Using 4K/0Ca baths for all fluids due to use of citrate.systemic and post-filter calcium being monitored per protocol; on infusion 3. May try to stop CVVHDF soon if off pressors x 24 hours - unsure if will tolerate 4. UF goal net neg 50 to 100 an hour as tolerated 2. Cardiogenic shock-  Hx milrinone due to development of a fib with rvr and wide complex tachycardia.   1. Previous nephrologist discussed case with Dr. Haroldine Laws who felt that he has a known severe NICM with EF <20% and did not feel that he would tolerate IHD but would continue to support with milrinone and midodrine/pressors as needed to help promote renal perfusion.  3. Acute stroke- presumed embolic with bilateral infarcts and LV thrombus- received tpa on admission 4. Vascular access-right fem HD catheter placed 05/06/19 5. SVT/polymorphic VT- per Cardiology 6. Acute hypoxic respiratory failure- s/p trach  7. Thrombocytopenia with HIT - heparin discontinued. HIT Ab+.Platelets improving. 8. Disposition- overall prognosis is poor with ongoing RRT (previously felt could not tolerate stopping CVVHD due to hypercatabolic state), trach, as well as cardiogenic shock/cardiomyopathy. palliative care  consulted    Claudia Desanctis, MD 05/10/2019 7:27 AM

## 2019-05-10 NOTE — Progress Notes (Signed)
Patient flipped back into a wide complex ST w/ rate 120s and developed persistant hypotension as low as 52/36 MAP 43, not currently on pressors. Milrinone gtt stopped for now. Notified CCM, new orders for neo gtt & amio bolus and to page cards to update them. Notified cards who also agreed with amio bolus and neo gtt. EKG done and stat BMET, mag, phos pending.

## 2019-05-10 NOTE — Progress Notes (Signed)
Nutrition Follow-up  DOCUMENTATION CODES:   Obesity unspecified  INTERVENTION:   Increase Vital 1.2 to 60 ml/hr via Cortrak tube 60 ml Prostat TID  Provides: 2328 kcal, 198 grams protein  NUTRITION DIAGNOSIS:   Inadequate oral intake related to inability to eat as evidenced by NPO status. Ongoing.   GOAL:   Patient will meet greater than or equal to 90% of their needs Meeting with TF.   MONITOR:   Vent status  REASON FOR ASSESSMENT:   Ventilator    ASSESSMENT:   Pt with PMH of CHF, OSA, poorly controlled DM, cocaine abuse admitted with L MCA stroke s/p tPA and IR thrombectomy.   Per Nephrology pt will continue CRRT as he has no evidence of renal recovery. Will attempt another trial of holding CRRT in 1-2 days to see if he could tolerate transition to iHD.  Pt has now tolerated trach collar almost 48 hours Followed by Heart Failure team due to EF <20%. Pt on milrinone, ivabradine, and amio due to atrial tach.  Pt remains on pressor  2/6 started on CRRT  2/18 s/p trach placement 2/19 Cortrak placement  Medications reviewed and include:  10 units novolog every 4 hours, 25 units levemir BID  milrinone Levo @ 5 mcg Labs reviewed:  CBGs: 189-249   I&O:+21 L last 24 hrs  CRRT: 4489 ml   Vital 1.5 @ 50 ml with 60 ml Prostat TID Provides: 2040 kcal, 180 grams protein  Diet Order:   Diet Order            Diet NPO time specified  Diet effective now              EDUCATION NEEDS:   No education needs have been identified at this time  Skin:  Skin Assessment: Skin Integrity Issues: Skin Integrity Issues:: Stage II, Other (Comment) Stage II: R buttocks, penis Other: open wound buttocks  Last BM:  1100 ml via rectal tube  Height:   Ht Readings from Last 1 Encounters:  05/02/2019 6\' 1"  (1.854 m)    Weight:   Wt Readings from Last 1 Encounters:  05/10/19 108 kg    Ideal Body Weight:  83.6 kg  BMI:  Body mass index is 31.41 kg/m.  Estimated  Nutritional Needs:   Kcal:  2200-2400  Protein:  167 -209 grams  Fluid:  2 L/day  Lockie Pares., RD, LDN, CNSC See AMiON for contact information

## 2019-05-10 NOTE — Progress Notes (Signed)
Dr. Levada Schilling note reviewed on my nighttime chart review.   Based on ECG in computer, patient currently in NSR. Rate slowed with ivabradine from 120s to now 100-105.   Given cardiogenic shock would not use phenylephrine to support BP would titrate norepinephrine as need to support BP.   Glori Bickers, MD  12:04 AM

## 2019-05-10 NOTE — Progress Notes (Addendum)
Advanced Heart Failure Rounding Note   Subjective:    Remains on vent through trach. FiO2 40%. PEEP 5.   Continues w/ significant neurological deficits but able to follow some commands (able to close eyes and squeeze hand when instructed ).  Remains anuric and CRRT dependent. 4.5L removed yesterday. SCr improving, 3.47>>3.20>>2.40.  Developed tachycardia up into the 120s, persistent most of the day yesterday. Was given adenosine 43m IV with slowing of sinus tach and then sped back up.   Now on ivabradine and IV amiodarone. Currently NSR, HR 90s.   Weaning down milrinone, now on 0.125 mcg. Co-ox 60%.   Developed hypotension overnight and NE added back, currently 5 mcg. MAPs 70s-80s   Objective:   Weight Range:  Vital Signs:   Temp:  [97.9 F (36.6 C)-98.6 F (37 C)] 97.9 F (36.6 C) (02/23 1200) Pulse Rate:  [35-129] 85 (02/23 1245) Resp:  [12-34] 20 (02/23 1245) BP: (52-113)/(32-83) 91/62 (02/23 1245) SpO2:  [96 %-100 %] 96 % (02/23 1245) FiO2 (%):  [28 %] 28 % (02/23 1200) Weight:  [108 kg] 108 kg (02/23 0500) Last BM Date: 05/09/19  Weight change: Filed Weights   05/08/19 0500 05/09/19 0500 05/10/19 0500  Weight: 111.1 kg 106.4 kg 108 kg    Intake/Output:   Intake/Output Summary (Last 24 hours) at 05/10/2019 1323 Last data filed at 05/10/2019 1300 Gross per 24 hour  Intake 4896.95 ml  Output 5454 ml  Net -557.05 ml     Physical Exam: General:  Awake on trach. Will track and follow some simple commands HEENT: normal Neck: supple. + trach collar  Carotids 2+ bilat; no bruits. No lymphadenopathy or thryomegaly appreciated. Cor: PMI nondisplaced. RRR + s3 Lungs: clear Abdomen: obese, soft, nontender, nondistended. No hepatosplenomegaly. No bruits or masses. Good bowel sounds. Extremities: no cyanosis, clubbing, rash, edema Neuro: Awake on trach. Will track and follow some simple commands  Telemetry: currently NSR, 90s, brief run of ventricular  bigeminy Personally reviewed   Labs: Basic Metabolic Panel: Recent Labs  Lab 05/04/19 0427 05/04/19 1739 05/05/19 0500 05/05/19 0501 05/06/19 0500 05/06/19 0854602/20/21 0206 05/07/19 0246 05/08/19 0307 05/08/19 0816 05/08/19 1608 05/08/19 1609 05/09/19 0500 05/09/19 0631 05/09/19 1655 05/09/19 1657 05/09/19 2215 05/09/19 2252 05/10/19 0420 05/10/19 0422 05/10/19 0504 05/10/19 0757 05/10/19 0803  NA 134*   < > 134*   < > 134*   < >  --    < > 138   < > 138   < > 137   < > 137   < > 137   < > 141 140 140 139 140  K 4.6   < > 4.6   < > 7.3*   < >  --    < > 4.0   < > 3.9   < > 3.9   < > 3.7   < > 3.6   < > 3.6 3.8 3.5 3.7 3.8  CL 102   < > 98   < > 97*   < >  --    < > 94*   < > 93*   < > 92*   < > 92*   < > 94*   < > 93* 95* 92* 97* 95*  CO2 20*   < > 19*   < > 20*   < >  --    < > 26  --  27  --  26  --  26  --  25  --   --   --  27  --   --   GLUCOSE 138*   < > 209*   < > 171*   < >  --    < > 280*   < > 204*   < > 286*   < > 302*   < > 321*   < > 262* 217* 195* 203* 242*  BUN 59*   < > 63*   < > 110*   < >  --    < > 53*   < > 44*   < > 39*   < > 36*   < > 42*   < > 32* 36* 37* 35* 28*  CREATININE 2.72*   < > 3.05*   < > 6.23*   < >  --    < > 3.62*   < > 3.35*   < > 3.32*   < > 3.14*   < > 3.28*   < > 2.50* 3.10* 3.47* 3.20* 2.40*  CALCIUM 8.7*   < > 7.8*   < > 9.2   < >  --    < > 9.1  --  10.0   < > 9.7   < > 10.1  --  9.4  --   --   --  10.0  --   --   MG 2.7*  --  2.3  --  2.3  --  2.2  --   --   --   --   --   --   --   --   --  1.9  --   --   --   --   --   --   PHOS 4.0   < > 4.4   < > 7.2*   < >  --    < > 4.4  --  3.8  --  3.9  --  3.3  --   --   --   --   --  4.1  --   --    < > = values in this interval not displayed.    Liver Function Tests: Recent Labs  Lab 05/04/19 0453 05/04/19 1739 05/08/19 0307 05/08/19 1608 05/09/19 0500 05/09/19 1655 05/10/19 0504  AST 57*  --   --   --  44*  --   --   ALT 192*  --   --   --  78*  --   --   ALKPHOS 117  --    --   --  164*  --   --   BILITOT 1.6*  --   --   --  1.4*  --   --   PROT 7.4  --   --   --  8.2*  --   --   ALBUMIN 2.5*   < > 2.3* 2.4* 2.6* 2.7* 2.9*   < > = values in this interval not displayed.   No results for input(s): LIPASE, AMYLASE in the last 168 hours. No results for input(s): AMMONIA in the last 168 hours.  CBC: Recent Labs  Lab 05/05/19 0500 05/05/19 0501 05/06/19 0500 05/06/19 1558 05/07/19 0246 05/07/19 0343 05/08/19 0307 05/08/19 0102 05/09/19 0500 05/09/19 0631 05/10/19 0109 05/10/19 0420 05/10/19 0422 05/10/19 0757 05/10/19 0803  WBC 16.3*  --  15.6*  --  14.0*  --  18.3*  --  11.1*  --   --   --   --   --   --  HGB 11.2*   < > 9.7*   < > 8.4*   < > 9.3*   < > 9.9*   < > 12.6* 13.9 12.9* 12.9* 14.3  HCT 35.2*   < > 31.1*   < > 26.4*   < > 29.2*   < > 32.3*   < > 37.0* 41.0 38.0* 38.0* 42.0  MCV 96.7  --  97.2  --  95.3  --  97.0  --  98.5  --   --   --   --   --   --   PLT 123*  --  165  --  194  --  362  --  419*  --   --   --   --   --   --    < > = values in this interval not displayed.    Cardiac Enzymes: No results for input(s): CKTOTAL, CKMB, CKMBINDEX, TROPONINI in the last 168 hours.  BNP: BNP (last 3 results) Recent Labs    12/13/18 1750 12/15/18 0405 04/23/19 0945  BNP 1,156.1* 1,161.0* 2,909.9*    ProBNP (last 3 results) No results for input(s): PROBNP in the last 8760 hours.    Other results:  Imaging: DG Chest Port 1 View  Result Date: 05/10/2019 CLINICAL DATA:  Pneumonia, trach EXAM: PORTABLE CHEST 1 VIEW COMPARISON:  05/08/2019 FINDINGS: Cardiomegaly with pulmonary vascular congestion. Mild bibasilar opacities, likely atelectasis. No frank interstitial edema. No pleural effusion or pneumothorax. Tracheostomy in satisfactory position. Right chest port terminates cavoatrial junction. Enteric tube coursing into the distal stomach. IMPRESSION: Cardiomegaly pelvis congestion.  No frank interstitial edema. Mild bibasilar  opacities, likely atelectasis. Support apparatus as above. Electronically Signed   By: Julian Hy M.D.   On: 05/10/2019 08:48     Medications:     Scheduled Medications: . sodium chloride   Intravenous Once  . amiodarone  200 mg Per Tube BID  . aspirin  81 mg Per Tube Daily  . atorvastatin  40 mg Per Tube q1800  . chlorhexidine gluconate (MEDLINE KIT)  15 mL Mouth Rinse BID  . Chlorhexidine Gluconate Cloth  6 each Topical Q0600  . feeding supplement (PRO-STAT SUGAR FREE 64)  60 mL Per Tube TID  . insulin aspart  0-20 Units Subcutaneous Q4H  . insulin aspart  10 Units Subcutaneous Q4H  . insulin detemir  25 Units Subcutaneous BID  . ivabradine  2.5 mg Per Tube BID WC  . mouth rinse  15 mL Mouth Rinse 10 times per day  . pantoprazole sodium  40 mg Per Tube Q1200  . QUEtiapine  25 mg Per Tube QHS  . sodium chloride flush  10-40 mL Intracatheter Q12H  . sodium chloride flush  3 mL Intravenous Q12H    Infusions: . sodium chloride Stopped (05/04/19 0200)  . sodium chloride    . bivalirudin (ANGIOMAX) infusion 0.5 mg/mL (Non-ACS indications) 0.06 mg/kg/hr (05/10/19 1300)  . calcium gluconate infusion for CRRT 70 mL/hr at 05/10/19 1300  . feeding supplement (VITAL AF 1.2 CAL) 50 mL/hr at 05/10/19 1300  . milrinone 0.125 mcg/kg/min (05/10/19 1300)  . norepinephrine (LEVOPHED) Adult infusion 5 mcg/min (05/10/19 1300)  . piperacillin-tazobactam Stopped (05/10/19 1234)  . prismasol B22GK 4/0 500 mL/hr at 05/07/19 0513  . prismasol B22GK 4/0 300 mL/hr at 05/09/19 2129  . prismasol B22GK 4/0 1,500 mL/hr at 05/10/19 1154  . sodium citrate 2 %/dextrose 2.5% solution 3000 mL 300 mL/hr at 05/10/19 0425    PRN  Medications: sodium chloride, sodium chloride, bisacodyl, docusate, fentaNYL, fentaNYL (SUBLIMAZE) injection, labetalol **AND** [DISCONTINUED] niCARDipine, midazolam, sodium chloride, sodium chloride flush, sodium chloride flush   Assessment/Plan:   1. Acute on chronic  systolic HF due to NICM-> cardiogenic shock - Echo EF < 20% - now on milrinone and weaning down, now on 0.125 mgc. Co-ox 60% 2. AKI -> ESRD - started on CRRT 04/23/19 - remains anuric  - volume status improved - SCr gradually improving  3. Acute CVA with severe residual deficits due to extensive R cerebellar, midbrain, R MCA and L MCA infarcts  - likely cardioembolic - s/p clot extraction - mental status improving slowly but still quite compromised 4. Acute hypoxic respiratory failure - s/p trach  - now tolerating for > 24 hours 5. Wide complex tachycardia - tele reviewed extensively and discussed with EP - On 2/20 developed several hours of atrial tach. Since that time had had sinus tach with progressively increasing HRs, up into the 120s.  He recieved 1 x dose of adenosine 70m IV with slowing of sinus tach and then sped back up. Started on ivabradine. HR improved into the 90s. - Do not see any evidence of VT currently.  - Continue low dose ivabradine 2.5 bid.  -Continue amio 200 mg bid for now given atrial tach  Length of Stay: 1[place] PA-C 05/10/2019, 1:23 PM  Advanced Heart Failure Team Pager [phone](M-F; 7a - 4p)  Please contact [facility] for night-coverage after hours (4p -7a ) and weekends on [email or web address]  Agree with above.   Hypotensive overnight and now on low-dose NE at 4 + milrinone 0.125. SBP ~110. Started on ivabradine yesterday with significant improvement in resting sinus tach. HR now in 90s. Having periods of frequent PVCs and bigeminy but no sustained VT. On IV amio.   Remains anuric on CVVHD. Able to follow basic commands but still with significant neuro deficits  General:  Sitting in bed on TC Can wiggle toes on R foot to command. Tracks  HEENT: normal Neck: supple. + trach no  obviousJVD. Carotids 2+ bilat; no bruits. No lymphadenopathy or thryomegaly appreciated. Cor: PMI nondisplaced. Regular rate & rhythm. No rubs, gallops or  murmurs. Lungs: clear Abdomen: obese soft, nontender, nondistended. No hepatosplenomegaly. No bruits or masses. Good bowel sounds. Extremities: no cyanosis, clubbing, rash, edema Neuro: Sitting in bed on TC Can wiggle toes on R foot to command. Tracks   Back on NE for BP support. Will add midodrine to facilitate pressor wean. Continue milrinone for now. HR improved on ivabradine - will increase to 5 bid. For PVCs would keep K > 4.0 and Mg > 2.0.  Remains anuric. If no renal recovery doubt he will be able to tolerate iHD with severe cardiomyopathy and hypotension but will continue to take one day at a time. D/w Dr. FRoyce Macadamia(Renal). If unable to switch to iHD would be a palliative situation.   CRITICAL CARE Performed by: BGlori Bickers Total critical care time: 35 minutes  Critical care time was exclusive of separately billable procedures and treating other patients.  Critical care was necessary to treat or prevent imminent or life-threatening deterioration.  Critical care was time spent personally by me (independent of midlevel providers or residents) on the following activities: development of treatment plan with patient and/or surrogate as well as nursing, discussions with consultants, evaluation of patient's response to treatment, examination of patient, obtaining history from patient or surrogate, ordering and performing treatments and  interventions, ordering and review of laboratory studies, ordering and review of radiographic studies, pulse oximetry and re-evaluation of patient's condition.  Glori Bickers, MD  2:51 PM

## 2019-05-10 NOTE — Progress Notes (Signed)
The chaplain continues to support the patient and family through this difficult sickness. The chaplain offered prayer and support for the patients mother at bedside. The mother appears to be optimistic about the patient's current health because the patient's eyes are open. The mother is hoping for her son to return to full health and believes that he is getting better. The chaplain will continue to follow this patient.   Brion Aliment Chaplain Resident For questions concerning this note please contact me by pager [phone]

## 2019-05-10 NOTE — Progress Notes (Signed)
Assisted tele visit to patient with family member.  Ayden Hardwick R, RN  

## 2019-05-10 NOTE — Progress Notes (Signed)
NAME:  Carlos Iwanicki., MRN:  [id number], DOB:  [birth year], LOS: 75 ADMISSION DATE:  04/30/2019, CONSULTATION DATE:  05/04/2019 REFERRING MD:  Dr. Leonel Ramsay, CHIEF COMPLAINT:  Slurred speech  Brief History   34 yo male smoker found to have slurred speech and Rt sided weakness.  CT head showed M2 occlusion.  Ultimately treated with thrombolytic and thrombectomy by IR.  Required intubation for airway protection. Course complicated by septic shock, AKI requiring CRRT and polymorphic VT  Past Medical History  Systolic CHF with non ischemic CM, Cocaine abuse, OSA, DM Hx of CHF (EF 15%)  Hx myocarditis Smoker 1/2 ppd   Significant Hospital Events   2/05 Admit, tPA, IR thrombectomy 2/6 100% on fvent at 2300  Overnight requiring increasing levophed and phenylephrine.    2/6: switched to levophed, epi, started antibiotics, started on CRRT.  2/9  developed wide-complex tachycardia and hypotension, started on amiodarone drip, increase Levophed drip 2/15 drop in platelets, heparin stopped, bival started 2/17 Hypotensive and back on Levophed 2/18 trach, lines changed 2/19 started milrinone , fever 103 2/20 Episode of wide-complex tachycardia yesterday, changed from Levophed to vasopressin 2/22 stopping vanc. Still on inotrope support. Some AF w/ RVR. Had to be placed back on pressors.  2/23 still on pressors.  Consults:  Neuro IR  Procedures:  ETT 2/05 >> 2/18 [facility] CVL 2/5 >>2/18  RT [place] CVL 2/18 >> RIJ HD cath 2/6 >>2/18  RT fem HD 2/19 >>  2/5-2/6:  TPA given at 428 am (total of 90 Mg) 520 am went to IR  S/P Lt common carotid arteriogram followed by complete revascularization of occluded LT MCA sup division mid M2 seg with x 1 pass with 60mx 40 mm solitaire X ret river device and penumbra aspiration with TICI 3 revascularization   Significant Diagnostic Tests:  CT angio head/neck 2/05 >> occlusion of Lt MCA bifurcation Echo 2/05 >> EF less than 20%, cannot rule out apical  thrombus MRI brain 2/11 > extensive acute infarction of multiple areas without large or medium vessel occlusion Echocardiogram 2/8 Left ventricular ejection fraction, by estimation, is <20%. The left  ventricle has severely decreased function. The left ventricle demonstrates  global hypokinesis. The left ventricular internal cavity size was severely  dilated. Possible small 0.8 x  0.6 cm apical thrombus. Somewhat subtle finding, could confirm with  cardiac MRI. However, if patient has had CVA, would be reasonable to  anticoagulate given severity of LV dysfunction if no contraindications to  anticoagulation.   Micro Data:  SARS CoV2 PCR 2/05 >> negative Influenza PCR 2/05 >> negative Urine 2/6 >> ng resp 2/6 >> nml flora BC 2/6 >>ng resp 2/16 >> [facility] >>  Antimicrobials:  mero 2/6 -2/12 Zosyn 2/6 , 2/19 >> vanc 2/6 >> 2/9 , 2/19 >>2/22  Interim history/subjective:  Appears comfortable  Objective   Blood pressure (Abnormal) 89/71, pulse (Abnormal) 109, temperature 98.6 F (37 C), temperature source Axillary, resp. rate (Abnormal) 22, height 6' 1"  (1.854 m), weight 108 kg, SpO2 99 %.    Vent Mode: Stand-by FiO2 (%):  [28 %] 28 %   Intake/Output Summary (Last 24 hours) at 05/10/2019 0835 Last data filed at 05/10/2019 0800 Gross per 24 hour  Intake 6341.48 ml  Output 5667 ml  Net 674.48 ml   Filed Weights   05/08/19 0500 05/09/19 0500 05/10/19 0500  Weight: 111.1 kg 106.4 kg 108 kg    Examination:  General this 34year old black male he sitting  up in bed, interactive but not following commands HEENT normocephalic atraumatic mucous membranes moist he does track throughout the room.  He has a size 6 cuffed tracheostomy, the cuff is deflated Pulmonary: Clear to auscultation diminished bases no accessory use Cardiac: Tachycardic regular rhythm Abdomen: Soft nontender Extremities: Warm dry brisk cap refill Neuro: Awake, will not follow commands currently, does move  extremities. GU: An uric  Resolved Hospital Problem list     Assessment & Plan:   Left MCA CVA Multiple other infarcts noted on MRI 0/92-[id number] embolic Acute metabolic encephalopathy Plan Continuing supportive care HS Seroquel  Acute systolic HF with cardiogenic shock in setting of NICM, ejection fraction is less than 20% Plan Continue inotropic support as directed by heart failure team  Titrating norepinephrine for systolic blood pressure greater than 90  Holding his home Zaroxolyn Entresto Aldactone and torsemide  Volume removal per nephrology   Left ventricle apical thrombus non ischemic cardiomyopathy with systolic congestive heart failure Polymorphic VT on 2/9, WCT 2/19 Plan Continue amiodarone Continue Corlanor   Fever. Leukocytosis and possible HCAP. afeb but is on CRRT -CVL changed -sputum neg -Cultures pending Plan Discontinue Zosyn  Acute hypoxemic respiratory failure now tracheostomy dependent after stroke Tolerating ATC Plan Remove ventilator from room Routine tracheostomy care Remove trach sutures on 2/25  Acute kidney injury. 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 Plan Lantus changed to twice daily, continuing sliding scale insulin CBG goal 140-180  Hx of cocaine abuse. depression. - not active per UDS  Plan Continue Prozac  Best practice:  Diet: NPO,  TFs DVT prophylaxis: SCDs GI prophylaxis: protonix Mobility: bed rest Code Status: full code Disposition: ICU-->the issue will be if he can come off CRRT and tolerate traditional HD w/ his CM  Family -fianc [name] 2/21 My cct 77 minutes  Erick Colace ACNP-BC [place] Pager # [phone] OR # [phone] if no answer

## 2019-05-10 NOTE — Progress Notes (Signed)
ANTICOAGULATION CONSULT NOTE  Pharmacy Consult:  Bivalirudin Indication: stroke 2/5, LV thrombus 2/8, HIT 2/15   Allergies  Allergen Reactions  . Heparin Other (See Comments)    Heparin induced thrombocytopenia. 2/15 HIT OD 1.692.     Patient Measurements: Height: 6\' 1"  (185.4 cm) Weight: 238 lb 1.6 oz (108 kg) IBW/kg (Calculated) : 79.9   Vital Signs: Temp: 98.6 F (37 C) (02/23 0400) Temp Source: Axillary (02/23 0400) BP: 93/71 (02/23 0700) Pulse Rate: 105 (02/23 0700)  Labs: Recent Labs    05/08/19 0307 05/08/19 0816 05/09/19 0500 05/09/19 0631 05/10/19 0108 05/10/19 0108 05/10/19 0109 05/10/19 0420 05/10/19 0504  HGB 9.3*   < > 9.9*   < > 13.6   < > 12.6* 13.9  --   HCT 29.2*   < > 32.3*   < > 40.0  --  37.0* 41.0  --   PLT 362  --  419*  --   --   --   --   --   --   APTT 50*  --  58*  --   --   --   --   --  50*  CREATININE 3.62*   < > 3.32*   < > 2.50*   < > 3.10* 2.50* 3.47*   < > = values in this interval not displayed.    Estimated Creatinine Clearance: 38.7 mL/min (A) (by C-G formula based on SCr of 3.47 mg/dL (H)).  Assessment: 34 yr old male with left MCA infarct - received TPA and revascularization with IR on 2/5. On 2/8 found to have small LV apical thrombus on ECHO. Pharmacy consulted to dose IV heparin, which was switched to bivalirudin 2/15 for HIT.  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 SRA pending (drawn 2/16).    APTT therapeutic; no issue with infusion.  No bleeding reported.  Goal of Therapy:  APTT 50- 65 sec  Monitor platelets by anticoagulation protocol: Yes   Plan:  Continue bivalirudin at 0.06 mg/kg/hr (AdjBW) Daily aPTT and CBC F/u SRA  Alanda Slim, PharmD, [facility] Clinical Pharmacist Please see AMION for all Pharmacists' Contact Phone Numbers 05/10/2019, 8:03 AM

## 2019-05-11 DIAGNOSIS — I639 Cerebral infarction, unspecified: Secondary | ICD-10-CM | POA: Diagnosis not present

## 2019-05-11 DIAGNOSIS — N170 Acute kidney failure with tubular necrosis: Secondary | ICD-10-CM | POA: Diagnosis not present

## 2019-05-11 DIAGNOSIS — J9600 Acute respiratory failure, unspecified whether with hypoxia or hypercapnia: Secondary | ICD-10-CM | POA: Diagnosis not present

## 2019-05-11 DIAGNOSIS — N179 Acute kidney failure, unspecified: Secondary | ICD-10-CM | POA: Diagnosis not present

## 2019-05-11 DIAGNOSIS — R570 Cardiogenic shock: Secondary | ICD-10-CM | POA: Diagnosis not present

## 2019-05-11 LAB — CALCIUM, IONIZED: Calcium, Ionized, Serum: 4.6 mg/dL (ref 4.5–5.6)

## 2019-05-11 LAB — RENAL FUNCTION PANEL
Albumin: 3.1 g/dL — ABNORMAL LOW (ref 3.5–5.0)
Albumin: 2.9 g/dL — ABNORMAL LOW (ref 3.5–5.0)
Anion gap: 22 — ABNORMAL HIGH (ref 5–15)
Anion gap: 16 — ABNORMAL HIGH (ref 5–15)
BUN: 44 mg/dL — ABNORMAL HIGH (ref 6–20)
BUN: 43 mg/dL — ABNORMAL HIGH (ref 6–20)
CO2: 30 mmol/L (ref 22–32)
CO2: 24 mmol/L (ref 22–32)
Calcium: 11 mg/dL — ABNORMAL HIGH (ref 8.9–10.3)
Calcium: 9.9 mg/dL (ref 8.9–10.3)
Chloride: 88 mmol/L — ABNORMAL LOW (ref 98–111)
Chloride: 98 mmol/L (ref 98–111)
Creatinine, Ser: 3.41 mg/dL — ABNORMAL HIGH (ref 0.61–1.24)
Creatinine, Ser: 3.29 mg/dL — ABNORMAL HIGH (ref 0.61–1.24)
GFR calc Af Amer: 26 mL/min — ABNORMAL LOW (ref >60)
GFR calc Af Amer: 27 mL/min — ABNORMAL LOW (ref >60)
GFR calc non Af Amer: 22 mL/min — ABNORMAL LOW (ref >60)
GFR calc non Af Amer: 23 mL/min — ABNORMAL LOW (ref >60)
Glucose, Bld: 226 mg/dL — ABNORMAL HIGH (ref 70–99)
Glucose, Bld: 223 mg/dL — ABNORMAL HIGH (ref 70–99)
Phosphorus: 5.7 mg/dL — ABNORMAL HIGH (ref 2.5–4.6)
Phosphorus: 5.7 mg/dL — ABNORMAL HIGH (ref 2.5–4.6)
Potassium: 3.9 mmol/L (ref 3.5–5.1)
Potassium: 3.9 mmol/L (ref 3.5–5.1)
Sodium: 140 mmol/L (ref 135–145)
Sodium: 138 mmol/L (ref 135–145)

## 2019-05-11 LAB — POCT I-STAT, CHEM 8
BUN: 36 mg/dL — ABNORMAL HIGH (ref 6–20)
BUN: 45 mg/dL — ABNORMAL HIGH (ref 6–20)
BUN: 40 mg/dL — ABNORMAL HIGH (ref 6–20)
Calcium, Ion: 0.41 mmol/L — CL (ref 1.15–1.40)
Calcium, Ion: 1.03 mmol/L — ABNORMAL LOW (ref 1.15–1.40)
Calcium, Ion: 0.98 mmol/L — ABNORMAL LOW (ref 1.15–1.40)
Chloride: 91 mmol/L — ABNORMAL LOW (ref 98–111)
Chloride: 92 mmol/L — ABNORMAL LOW (ref 98–111)
Chloride: 92 mmol/L — ABNORMAL LOW (ref 98–111)
Creatinine, Ser: 2.6 mg/dL — ABNORMAL HIGH (ref 0.61–1.24)
Creatinine, Ser: 3.4 mg/dL — ABNORMAL HIGH (ref 0.61–1.24)
Creatinine, Ser: 3.5 mg/dL — ABNORMAL HIGH (ref 0.61–1.24)
Glucose, Bld: 241 mg/dL — ABNORMAL HIGH (ref 70–99)
Glucose, Bld: 286 mg/dL — ABNORMAL HIGH (ref 70–99)
Glucose, Bld: 281 mg/dL — ABNORMAL HIGH (ref 70–99)
HCT: 40 % (ref 39.0–52.0)
HCT: 44 % (ref 39.0–52.0)
HCT: 42 % (ref 39.0–52.0)
Hemoglobin: 13.6 g/dL (ref 13.0–17.0)
Hemoglobin: 15 g/dL (ref 13.0–17.0)
Hemoglobin: 14.3 g/dL (ref 13.0–17.0)
Potassium: 3.6 mmol/L (ref 3.5–5.1)
Potassium: 3.7 mmol/L (ref 3.5–5.1)
Potassium: 3.9 mmol/L (ref 3.5–5.1)
Sodium: 139 mmol/L (ref 135–145)
Sodium: 140 mmol/L (ref 135–145)
Sodium: 138 mmol/L (ref 135–145)
TCO2: 31 mmol/L (ref 22–32)
TCO2: 32 mmol/L (ref 22–32)
TCO2: 31 mmol/L (ref 22–32)

## 2019-05-11 LAB — GLUCOSE, CAPILLARY
Glucose-Capillary: 211 mg/dL — ABNORMAL HIGH (ref 70–99)
Glucose-Capillary: 254 mg/dL — ABNORMAL HIGH (ref 70–99)
Glucose-Capillary: 235 mg/dL — ABNORMAL HIGH (ref 70–99)
Glucose-Capillary: 188 mg/dL — ABNORMAL HIGH (ref 70–99)
Glucose-Capillary: 226 mg/dL — ABNORMAL HIGH (ref 70–99)
Glucose-Capillary: 225 mg/dL — ABNORMAL HIGH (ref 70–99)

## 2019-05-11 LAB — COOXEMETRY PANEL
Carboxyhemoglobin: 1.8 % — ABNORMAL HIGH (ref 0.5–1.5)
Methemoglobin: 0.9 % (ref 0.0–1.5)
O2 Saturation: 63.4 %
Total hemoglobin: 12.7 g/dL (ref 12.0–16.0)

## 2019-05-11 LAB — CULTURE, BLOOD (ROUTINE X 2)
Culture: NO GROWTH
Culture: NO GROWTH
Special Requests: ADEQUATE

## 2019-05-11 LAB — APTT: aPTT: 47 seconds — ABNORMAL HIGH (ref 24–36)

## 2019-05-11 LAB — MAGNESIUM: Magnesium: 2.4 mg/dL (ref 1.7–2.4)

## 2019-05-11 MED ORDER — CHLORHEXIDINE GLUCONATE CLOTH 2 % EX PADS
6.0000 | MEDICATED_PAD | Freq: Every day | CUTANEOUS | Status: DC
  Administered 2019-05-11 – 2019-05-26 (×16): 6 via TOPICAL

## 2019-05-11 MED ORDER — PRISMASOL BGK 4/2.5 32-4-2.5 MEQ/L REPLACEMENT SOLN
Status: DC
  Filled 2019-05-11 (×4): qty 5000

## 2019-05-11 MED ORDER — PRISMASOL BGK 4/2.5 32-4-2.5 MEQ/L REPLACEMENT SOLN
Status: DC
  Filled 2019-05-11 (×3): qty 5000

## 2019-05-11 MED ORDER — PRISMASOL BGK 4/2.5 32-4-2.5 MEQ/L IV SOLN
INTRAVENOUS | Status: DC
  Filled 2019-05-11 (×15): qty 5000

## 2019-05-11 MED ORDER — INSULIN DETEMIR 100 UNIT/ML ~~LOC~~ SOLN
30.0000 [IU] | Freq: Two times a day (BID) | SUBCUTANEOUS | Status: DC
  Administered 2019-05-11 – 2019-05-13 (×5): 30 [IU] via SUBCUTANEOUS
  Filled 2019-05-11 (×6): qty 0.3

## 2019-05-11 MED ORDER — FLUOXETINE HCL 10 MG PO CAPS
20.0000 mg | ORAL_CAPSULE | Freq: Every day | ORAL | Status: DC
  Administered 2019-05-11 – 2019-07-09 (×56): 20 mg
  Filled 2019-05-11 (×7): qty 1
  Filled 2019-05-11: qty 2
  Filled 2019-05-11 (×2): qty 1
  Filled 2019-05-11: qty 2
  Filled 2019-05-11 (×4): qty 1
  Filled 2019-05-11: qty 2
  Filled 2019-05-11 (×2): qty 1
  Filled 2019-05-11: qty 2
  Filled 2019-05-11 (×3): qty 1
  Filled 2019-05-11: qty 2
  Filled 2019-05-11 (×4): qty 1
  Filled 2019-05-11: qty 2
  Filled 2019-05-11 (×15): qty 1
  Filled 2019-05-11: qty 2
  Filled 2019-05-11 (×7): qty 1
  Filled 2019-05-11: qty 2
  Filled 2019-05-11 (×3): qty 1
  Filled 2019-05-11: qty 2
  Filled 2019-05-11: qty 1

## 2019-05-11 MED ORDER — MIDODRINE HCL 5 MG PO TABS
5.0000 mg | ORAL_TABLET | Freq: Three times a day (TID) | ORAL | Status: DC
  Administered 2019-05-11 – 2019-05-13 (×5): 5 mg
  Filled 2019-05-11 (×4): qty 1

## 2019-05-11 MED ORDER — SODIUM CHLORIDE 0.9 % IV BOLUS
500.0000 mL | Freq: Once | INTRAVENOUS | Status: AC
  Administered 2019-05-11: 18:00:00 500 mL via INTRAVENOUS

## 2019-05-11 MED ORDER — MIDODRINE HCL 5 MG PO TABS
5.0000 mg | ORAL_TABLET | Freq: Three times a day (TID) | ORAL | Status: DC
  Filled 2019-05-11: qty 1

## 2019-05-11 MED ORDER — ANTICOAGULANT SODIUM CITRATE 4% (200MG/5ML) IV SOLN
5.0000 mL | Status: DC | PRN
  Administered 2019-05-11: 20:00:00 2.8 mL via INTRAVENOUS_CENTRAL
  Administered 2019-05-19: 3.2 mL via INTRAVENOUS_CENTRAL
  Administered 2019-05-22 – 2019-05-30 (×3): 5 mL via INTRAVENOUS_CENTRAL
  Administered 2019-06-01: 3.2 mL via INTRAVENOUS_CENTRAL
  Administered 2019-06-01 – 2019-06-09 (×3): 5 mL via INTRAVENOUS_CENTRAL
  Filled 2019-05-11 (×4): qty 5
  Filled 2019-05-11: qty 2.8
  Filled 2019-05-11 (×6): qty 5
  Filled 2019-05-11: qty 3.2
  Filled 2019-05-11 (×2): qty 5

## 2019-05-11 MED ORDER — SODIUM CHLORIDE 0.9 % IV BOLUS
500.0000 mL | Freq: Once | INTRAVENOUS | Status: AC
  Administered 2019-05-11: 500 mL via INTRAVENOUS

## 2019-05-11 NOTE — Plan of Care (Addendum)
Patient getting a 500 mL bolus per CHF (not to be removed with CRRT).  Spoke with nursing.    Will keep positive 100 ml/hr x 6 hrs then keep even.  Has clotted x 2 today.  If clots again tonight overnight do not restart and will reassess on AM rounds.   Claudia Desanctis  05/11/2019 6:29 PM

## 2019-05-11 NOTE — Progress Notes (Addendum)
Nephrology Progress Note:  Patient ID: Carlos Brewer., male   DOB: 04-04-1985, 34 y.o.   MRN: [id number]   S:  On milrinone.  On levo at 14.  Seen on CRRT. Procedure supervised. Right femoral nontunneled catheter.  Had 4.1 liters UF with CRRT charted over 2/23. Anuric.  On calcium and citrate with the CRRT.    Review of systems: unable to obtain 2/2 pt AMS/nonverbal   O:BP (!) 103/59   Pulse (!) 40   Temp 98.4 F (36.9 C) (Axillary)   Resp 20   Ht 6' 1"  (1.854 m)   Wt 105.4 kg   SpO2 100%   BMI 30.66 kg/m   Intake/Output Summary (Last 24 hours) at 05/11/2019 6789 Last data filed at 05/11/2019 0700 Gross per 24 hour  Intake 3970.05 ml  Output 5356 ml  Net -1385.95 ml   Intake/Output: I/O last 3 completed shifts: In: 6118.2 [I.V.:3667.1; NG/GT:2050.3; IV Piggyback:400.8] Out: 8340 [FYBOF:7510; CHENI:7782]    Intake/Output this shift:  No intake/output data recorded. Weight change: -2.6 kg  Physical exam:  Gen: awake in bed, trach collar, nonverbal and not following commands but is tracking HEENT - trach in place CVS: tachy; S1S2 no rub; 98/71 and HR 88 Resp: mechanical breath sounds  Abd: soft/ND Ext: no edema Access: right femoral nontunneled catheter  Recent Labs  Lab 05/08/19 0307 05/08/19 0816 05/08/19 1608 05/08/19 1609 05/09/19 0500 05/09/19 0631 05/09/19 1655 05/09/19 1657 05/09/19 2215 05/09/19 2252 05/10/19 0504 05/10/19 0757 05/10/19 1557 05/10/19 1559 05/10/19 1951 05/10/19 2002 05/10/19 2154 05/10/19 2155 05/11/19 0210 05/11/19 0214 05/11/19 0445  NA 138   < > 138   < > 137   < > 137   < > 137   < > 140   < > 139   < > 138 138 138 140 140 139 140  K 4.0   < > 3.9   < > 3.9   < > 3.7   < > 3.6   < > 3.5   < > 3.8   < > 3.7 3.7 3.7 3.7 3.6 3.7 3.9  CL 94*   < > 93*   < > 92*   < > 92*   < > 94*   < > 92*   < > 91*   < > 91* 91* 91* 90* 91* 92* 88*  CO2 26  --  27  --  26  --  26  --  25  --  27  --  28  --   --   --   --   --   --    --  30  GLUCOSE 280*   < > 204*   < > 286*   < > 302*   < > 321*   < > 195*   < > 263*   < > 340* 322* 280* 314* 286* 241* 226*  BUN 53*   < > 44*   < > 39*   < > 36*   < > 42*   < > 37*   < > 38*   < > 34* 41* 41* 35* 36* 45* 44*  CREATININE 3.62*   < > 3.35*   < > 3.32*   < > 3.14*   < > 3.28*   < > 3.47*   < > 3.31*   < > 2.70* 3.50* 3.30* 2.80* 2.60* 3.40* 3.41*  ALBUMIN 2.3*  --  2.4*  --  2.6*  --  2.7*  --   --   --  2.9*  --  2.9*  --   --   --   --   --   --   --  3.1*  CALCIUM 9.1  --  10.0  --  9.7  --  10.1  --  9.4  --  10.0  --  10.6*  --   --   --   --   --   --   --  11.0*  PHOS 4.4  --  3.8  --  3.9  --  3.3  --   --   --  4.1  --  4.4  --   --   --   --   --   --   --  5.7*  AST  --   --   --   --  44*  --   --   --   --   --   --   --   --   --   --   --   --   --   --   --   --   ALT  --   --   --   --  78*  --   --   --   --   --   --   --   --   --   --   --   --   --   --   --   --    < > = values in this interval not displayed.   Liver Function Tests: Recent Labs  Lab 05/09/19 0500 05/09/19 1655 05/10/19 0504 05/10/19 1557 05/11/19 0445  AST 44*  --   --   --   --   ALT 78*  --   --   --   --   ALKPHOS 164*  --   --   --   --   BILITOT 1.4*  --   --   --   --   PROT 8.2*  --   --   --   --   ALBUMIN 2.6*   < > 2.9* 2.9* 3.1*   < > = values in this interval not displayed.   CBC: Recent Labs  Lab 05/05/19 0500 05/05/19 0501 05/06/19 0500 05/06/19 1558 05/07/19 0246 05/07/19 0343 05/08/19 0307 05/08/19 0816 05/09/19 0500 05/09/19 0631 05/10/19 2155 05/11/19 0210 05/11/19 0214  WBC 16.3*   < > 15.6*  --  14.0*  --  18.3*  --  11.1*  --   --   --   --   HGB 11.2*   < > 9.7*   < > 8.4*   < > 9.3*   < > 9.9*   < > 15.0 15.0 13.6  HCT 35.2*   < > 31.1*   < > 26.4*   < > 29.2*   < > 32.3*   < > 44.0 44.0 40.0  MCV 96.7  --  97.2  --  95.3  --  97.0  --  98.5  --   --   --   --   PLT 123*   < > 165  --  194  --  362  --  419*  --   --   --   --    < > =  values in this interval not displayed.   Cardiac Enzymes: No results for input(s): CKTOTAL, CKMB, CKMBINDEX,  TROPONINI in the last 168 hours. CBG: Recent Labs  Lab 05/10/19 1136 05/10/19 1557 05/10/19 2003 05/10/19 2333 05/11/19 0327  GLUCAP 249* 225* 279* 293* 211*    Studies/Results: DG Chest Port 1 View  Result Date: 05/10/2019 CLINICAL DATA:  Pneumonia, trach EXAM: PORTABLE CHEST 1 VIEW COMPARISON:  05/08/2019 FINDINGS: Cardiomegaly with pulmonary vascular congestion. Mild bibasilar opacities, likely atelectasis. No frank interstitial edema. No pleural effusion or pneumothorax. Tracheostomy in satisfactory position. Right chest port terminates cavoatrial junction. Enteric tube coursing into the distal stomach. IMPRESSION: Cardiomegaly pelvis congestion.  No frank interstitial edema. Mild bibasilar opacities, likely atelectasis. Support apparatus as above. Electronically Signed   By: Julian Hy M.D.   On: 05/10/2019 08:48   . sodium chloride   Intravenous Once  . amiodarone  200 mg Per Tube BID  . aspirin  81 mg Per Tube Daily  . atorvastatin  40 mg Per Tube q1800  . chlorhexidine gluconate (MEDLINE KIT)  15 mL Mouth Rinse BID  . Chlorhexidine Gluconate Cloth  6 each Topical Q0600  . feeding supplement (PRO-STAT SUGAR FREE 64)  60 mL Per Tube TID  . insulin aspart  0-20 Units Subcutaneous Q4H  . insulin aspart  10 Units Subcutaneous Q4H  . insulin detemir  25 Units Subcutaneous BID  . ivabradine  5 mg Per Tube BID WC  . mouth rinse  15 mL Mouth Rinse 10 times per day  . pantoprazole sodium  40 mg Per Tube Q1200  . QUEtiapine  25 mg Per Tube QHS  . sodium chloride flush  10-40 mL Intracatheter Q12H  . sodium chloride flush  3 mL Intravenous Q12H    BMET    Component Value Date/Time   NA 140 05/11/2019 0445   K 3.9 05/11/2019 0445   CL 88 (L) 05/11/2019 0445   CO2 30 05/11/2019 0445   GLUCOSE 226 (H) 05/11/2019 0445   BUN 44 (H) 05/11/2019 0445   CREATININE 3.41  (H) 05/11/2019 0445   CALCIUM 11.0 (H) 05/11/2019 0445   GFRNONAA 22 (L) 05/11/2019 0445   GFRAA 26 (L) 05/11/2019 0445   CBC    Component Value Date/Time   WBC 11.1 (H) 05/09/2019 0500   RBC 3.28 (L) 05/09/2019 0500   HGB 13.6 05/11/2019 0214   HCT 40.0 05/11/2019 0214   PLT 419 (H) 05/09/2019 0500   MCV 98.5 05/09/2019 0500   MCH 30.2 05/09/2019 0500   MCHC 30.7 05/09/2019 0500   RDW 14.1 05/09/2019 0500   LYMPHSABS 0.8 04/24/2019 0843   MONOABS 0.5 04/24/2019 0843   EOSABS 0.4 04/24/2019 0843   BASOSABS 0.0 04/24/2019 0843   Assessment/Plan:  1. AKIpresumably due to ATN with ischemic/nephrotoxic insults worsened by cardiogenic shock. Started on CRRT 04/23/19.  1. Resumed CVVHD 05/06/19 due to hyperkalemia which has resolved 2. Using 4K/0Ca baths for all fluids due to use of citrate.systemic and post-filter calcium being monitored per protocol; on infusion 3. Increase to 1.8 liters/hr 4. Can reassess stopping CVVHDF if off pressors x 24 hours though unsure if will tolerate 5. UF goal keep even for now with the pressor requirement 2. Cardiogenic shock-  Hx milrinone due to development of a fib with rvr and wide complex tachycardia.   1. Discussed with Dr. Haroldine Laws with advanced CHF who felt that he has a known severe NICM with EF <20% and did not feel that he would tolerate IHD but would continue to support with milrinone and midodrine/pressors as needed to help promote renal  perfusion.  3. Acute stroke- presumed embolic with bilateral infarcts and LV thrombus- received tpa on admission 4. SVT/polymorphic VT- per Cardiology 5. Acute hypoxic respiratory failure- s/p trach  6. 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 HIT Ab+.Platelets improving. 7. Disposition- overall prognosis is poor with ongoing RRT (felt that he would not tolerate stopping CVVHD due to hypercatabolic state), trach, as well as cardiogenic shock/cardiomyopathy. palliative care consulted    Claudia Desanctis, MD 05/11/2019 7:28 AM  Patient with hypercalcemia.  On bivalrudin managed per pharmacy.  Will discontinue citrate and transition to routine CRRT fluids.  Continue CRRT for clearance.  Keep even and bolus once now normal saline (do not remove with CRRT).  Claudia Desanctis 05/11/2019 8:04 AM

## 2019-05-11 NOTE — Progress Notes (Addendum)
Advanced Heart Failure Rounding Note   Subjective:    Remains on vent through trach. FiO2 40%. PEEP 5.   Continues w/ significant neurological deficits but able to follow some commands (able to close eyes and squeeze hand when instructed). Remains nonverbal.   Remains on Milrinone 0.125 and still requiring high dose of NE, currently at 18 mcg/min. Co-ox 63%. MAPs 70s-80s.    Remains anuric and CRRT dependent. 4.1L removed yesterday. SCr now trending back up 3.47>>3.20>>2.40>>3.41>>3.50.   HR improved w/ ivabradine. Currently NSR, HR 90s but continues to have frequent PVCs and runs of bigeminy. Off IV amio. Currently on PO. Getting tube feeds.     Objective:   Weight Range:  Vital Signs:   Temp:  [98.2 F (36.8 C)-98.7 F (37.1 C)] 98.7 F (37.1 C) (02/24 1200) Pulse Rate:  [37-98] 79 (02/24 1531) Resp:  [12-29] 20 (02/24 1531) BP: (73-133)/(40-112) 94/69 (02/24 1531) SpO2:  [92 %-100 %] 98 % (02/24 1531) FiO2 (%):  [28 %] 28 % (02/24 1531) Weight:  [105.4 kg] 105.4 kg (02/24 0500) Last BM Date: 05/10/19  Weight change: Filed Weights   05/09/19 0500 05/10/19 0500 05/11/19 0500  Weight: 106.4 kg 108 kg 105.4 kg    Intake/Output:   Intake/Output Summary (Last 24 hours) at 05/11/2019 1537 Last data filed at 05/11/2019 1500 Gross per 24 hour  Intake 3908.54 ml  Output 4822 ml  Net -913.46 ml     Physical Exam: General:  Awake on trach. Will track and follow some simple commands HEENT: normal Neck: supple. + trach collar  Carotids 2+ bilat; no bruits. No lymphadenopathy or thryomegaly appreciated. Cor: PMI nondisplaced. RRR + s3 Lungs: clear Abdomen: obese, soft, nontender, nondistended. No hepatosplenomegaly. No bruits or masses. Good bowel sounds. Extremities: no cyanosis, clubbing, rash, edema Neuro: Awake on trach. Will track and follow some simple commands. Non verbal   Telemetry: currently NSR, 90s, frequent PVCs and ventricular bigeminy Personally  reviewed   Labs: Basic Metabolic Panel: Recent Labs  Lab 05/05/19 0500 05/05/19 0501 05/06/19 0500 05/06/19 0807 05/07/19 0206 05/07/19 0246 05/09/19 0500 05/09/19 0631 05/09/19 1655 05/09/19 1657 05/09/19 2215 05/09/19 2252 05/10/19 0504 05/10/19 0757 05/10/19 1557 05/10/19 1559 05/10/19 2155 05/11/19 0210 05/11/19 0214 05/11/19 0445 05/11/19 0825  NA 134*   < > 134*   < >  --    < > 137   < > 137   < > 137   < > 140   < > 139   < > 140 140 139 140 138  K 4.6   < > 7.3*   < >  --    < > 3.9   < > 3.7   < > 3.6   < > 3.5   < > 3.8   < > 3.7 3.6 3.7 3.9 3.9  CL 98   < > 97*   < >  --    < > 92*   < > 92*   < > 94*   < > 92*   < > 91*   < > 90* 91* 92* 88* 92*  CO2 19*   < > 20*   < >  --    < > 26   < > 26  --  25  --  27  --  28  --   --   --   --  30  --   GLUCOSE 209*   < > 171*   < >  --    < >  286*   < > 302*   < > 321*   < > 195*   < > 263*   < > 314* 286* 241* 226* 281*  BUN 63*   < > 110*   < >  --    < > 39*   < > 36*   < > 42*   < > 37*   < > 38*   < > 35* 36* 45* 44* 40*  CREATININE 3.05*   < > 6.23*   < >  --    < > 3.32*   < > 3.14*   < > 3.28*   < > 3.47*   < > 3.31*   < > 2.80* 2.60* 3.40* 3.41* 3.50*  CALCIUM 7.8*   < > 9.2   < >  --    < > 9.7   < > 10.1   < > 9.4   < > 10.0  --  10.6*  --   --   --   --  11.0*  --   MG 2.3  --  2.3  --  2.2  --   --   --   --   --  1.9  --   --   --   --   --   --   --   --  2.4  --   PHOS 4.4   < > 7.2*   < >  --    < > 3.9  --  3.3  --   --   --  4.1  --  4.4  --   --   --   --  5.7*  --    < > = values in this interval not displayed.    Liver Function Tests: Recent Labs  Lab 05/09/19 0500 05/09/19 1655 05/10/19 0504 05/10/19 1557 05/11/19 0445  AST 44*  --   --   --   --   ALT 78*  --   --   --   --   ALKPHOS 164*  --   --   --   --   BILITOT 1.4*  --   --   --   --   PROT 8.2*  --   --   --   --   ALBUMIN 2.6* 2.7* 2.9* 2.9* 3.1*   No results for input(s): LIPASE, AMYLASE in the last 168 hours. No results  for input(s): AMMONIA in the last 168 hours.  CBC: Recent Labs  Lab 05/05/19 0500 05/05/19 0501 05/06/19 0500 05/06/19 1558 05/07/19 0246 05/07/19 0343 05/08/19 0307 05/08/19 0816 05/09/19 0500 05/09/19 0631 05/10/19 2154 05/10/19 2155 05/11/19 0210 05/11/19 0214 05/11/19 0825  WBC 16.3*  --  15.6*  --  14.0*  --  18.3*  --  11.1*  --   --   --   --   --   --   HGB 11.2*   < > 9.7*   < > 8.4*   < > 9.3*   < > 9.9*   < > 13.3 15.0 15.0 13.6 14.3  HCT 35.2*   < > 31.1*   < > 26.4*   < > 29.2*   < > 32.3*   < > 39.0 44.0 44.0 40.0 42.0  MCV 96.7  --  97.2  --  95.3  --  97.0  --  98.5  --   --   --   --   --   --  PLT 123*  --  165  --  194  --  362  --  419*  --   --   --   --   --   --    < > = values in this interval not displayed.    Cardiac Enzymes: No results for input(s): CKTOTAL, CKMB, CKMBINDEX, TROPONINI in the last 168 hours.  BNP: BNP (last 3 results) Recent Labs    12/13/18 1750 12/15/18 0405 04/23/19 0945  BNP 1,156.1* 1,161.0* 2,909.9*    ProBNP (last 3 results) No results for input(s): PROBNP in the last 8760 hours.    Other results:  Imaging: DG Chest Port 1 View  Result Date: 05/10/2019 CLINICAL DATA:  Pneumonia, trach EXAM: PORTABLE CHEST 1 VIEW COMPARISON:  05/08/2019 FINDINGS: Cardiomegaly with pulmonary vascular congestion. Mild bibasilar opacities, likely atelectasis. No frank interstitial edema. No pleural effusion or pneumothorax. Tracheostomy in satisfactory position. Right chest port terminates cavoatrial junction. Enteric tube coursing into the distal stomach. IMPRESSION: Cardiomegaly pelvis congestion.  No frank interstitial edema. Mild bibasilar opacities, likely atelectasis. Support apparatus as above. Electronically Signed   By: Julian Hy M.D.   On: 05/10/2019 08:48     Medications:     Scheduled Medications: . sodium chloride   Intravenous Once  . amiodarone  200 mg Per Tube BID  . aspirin  81 mg Per Tube Daily  .  atorvastatin  40 mg Per Tube q1800  . chlorhexidine gluconate (MEDLINE KIT)  15 mL Mouth Rinse BID  . Chlorhexidine Gluconate Cloth  6 each Topical Q0600  . feeding supplement (PRO-STAT SUGAR FREE 64)  60 mL Per Tube TID  . FLUoxetine  20 mg Per Tube Daily  . insulin aspart  0-20 Units Subcutaneous Q4H  . insulin aspart  10 Units Subcutaneous Q4H  . insulin detemir  30 Units Subcutaneous BID  . ivabradine  5 mg Per Tube BID WC  . mouth rinse  15 mL Mouth Rinse 10 times per day  . pantoprazole sodium  40 mg Per Tube Q1200  . QUEtiapine  25 mg Per Tube QHS  . sodium chloride flush  10-40 mL Intracatheter Q12H  . sodium chloride flush  3 mL Intravenous Q12H    Infusions: .  prismasol BGK 4/2.5 400 mL/hr at 05/11/19 0842  .  prismasol BGK 4/2.5 300 mL/hr at 05/11/19 0838  . sodium chloride Stopped (05/04/19 0200)  . sodium chloride    . bivalirudin (ANGIOMAX) infusion 0.5 mg/mL (Non-ACS indications) 0.0701 mg/kg/hr (05/11/19 1500)  . feeding supplement (VITAL AF 1.2 CAL) 60 mL/hr at 05/11/19 0700  . milrinone 0.125 mcg/kg/min (05/11/19 1500)  . norepinephrine (LEVOPHED) Adult infusion 18 mcg/min (05/11/19 1500)  . prismasol BGK 4/2.5 1,800 mL/hr at 05/11/19 1458    PRN Medications: sodium chloride, sodium chloride, bisacodyl, docusate, fentaNYL, fentaNYL (SUBLIMAZE) injection, labetalol **AND** [DISCONTINUED] niCARDipine, midazolam, sodium chloride flush, sodium chloride flush   Assessment/Plan:   1. Acute on chronic systolic HF due to NICM-> cardiogenic shock - Echo EF < 20% - now on milrinone and weaning down, now on 0.125 mgc. Co-ox 63% - remains on high dose of NE, 18 mcg/min.  - Will add midodrine to facilitate pressor wean, 5 mg tid - Volume controlled through CRRT 2. AKI -> ESRD - started on CRRT 04/23/19 - remains anuric  - volume status improved - SCr trending back up,  3.47>>3.20>>2.40>>3.41>>3.50. 3. Acute CVA with severe residual deficits due to extensive R  cerebellar, midbrain, R MCA and  L MCA infarcts  - likely cardioembolic - s/p clot extraction - mental status improving slowly but still quite compromised 4. Acute hypoxic respiratory failure - s/p trach  - now tolerating for > 24 hours 5. Wide complex tachycardia - tele reviewed extensively and discussed with EP - On 2/20 developed several hours of atrial tach. Since that time had had sinus tach with progressively increasing HRs, up into the 120s.  He recieved 1 x dose of adenosine 67m IV with slowing of sinus tach and then sped back up. Started on ivabradine. HR improved into the 90s. - Do not see any evidence of VT currently.  - Continue ivabradine 5 bid.  - Continue amio 200 mg bid for now given atrial tach - Keep K >4.0, Mg >2.0   Length of Stay: [place] PA-C 05/11/2019, 3:37 PM  Advanced Heart Failure Team Pager [phone](M-F; 7a - 4p)  Please contact [facility] for night-coverage after hours (4p -7a ) and weekends on [email or web address]    Agree with above.  Much more alert today. Follows commands. Remains on milrinone 0.125 and NE 14-18. Got 500 cc volume back earlier today. Remains on CVVHD keeping even. Still with frequent PVCs.   General: Awake on TC. No resp difficulty HEENT: normal Neck: supple. JVP flat + trach. Carotids 2+ bilat; no bruits. No lymphadenopathy or thryomegaly appreciated. Cor: PMI nondisplaced. Regular rate & rhythm. No rubs, gallops or murmurs. Lungs: clear Abdomen: obese soft, nontender, nondistended. No hepatosplenomegaly. No bruits or masses. Good bowel sounds. Extremities: no cyanosis, clubbing, rash, edema RFV trialysis Neuro: alert follows commands. Nonverbal  Remains on milrinone and NE/ Co-ox ok. Unable to wean NE.  We measured CVP personally and is 0-1. Will give 500cc more fluid back and will likely need more to get CVP to at least 5. Suspect this will help wean NE. Will also start midodrine. Keep K > 4.0 and MG > 2.0. Sinus  tach improved with ivabradine. D/w Dr. FRoyce Macadamiapersonally.   CRITICAL CARE Performed by: BGlori Bickers Total critical care time: 35 minutes  Critical care time was exclusive of separately billable procedures and treating other patients.  Critical care was necessary to treat or prevent imminent or life-threatening deterioration.  Critical care was time spent personally by me (independent of midlevel providers or residents) on the following activities: development of treatment plan with patient and/or surrogate as well as nursing, discussions with consultants, evaluation of patient's response to treatment, examination of patient, obtaining history from patient or surrogate, ordering and performing treatments and interventions, ordering and review of laboratory studies, ordering and review of radiographic studies, pulse oximetry and re-evaluation of patient's condition.  DGlori Bickers MD  6:19 PM

## 2019-05-11 NOTE — Progress Notes (Addendum)
NAME:  Carlos Bou., MRN:  [id number], DOB:  April 29, 1985, LOS: 87 ADMISSION DATE:  04/21/2019, CONSULTATION DATE:  05/14/2019 REFERRING MD:  Dr. Leonel Ramsay, CHIEF COMPLAINT:  Slurred speech  Brief History   34 yo male smoker found to have slurred speech and Rt sided weakness.  Admitted with left MCA CVA and multiple smaller embolic infarcts s/p tPA and thrombectomy in IR.  Additionally found to have left ventricular atrial thrombus.    Course complicated by septic shock r/t HCAP, AKI requiring CRRT, polymorphic VT and cardiogenic shock.   Past Medical History  Systolic CHF with non ischemic CM, Cocaine abuse, OSA, DM Hx of CHF (EF 15%)  Hx myocarditis Smoker 1/2 ppd   Significant Hospital Events   2/05 Admit, tPA, IR thrombectomy 2/6 100% on fvent at 2300  Overnight requiring increasing levophed and phenylephrine.    2/6: switched to levophed, epi, started antibiotics, started on CRRT.  2/9  developed wide-complex tachycardia and hypotension, started on amiodarone drip, increase Levophed drip 2/15 drop in platelets, heparin stopped, bival started 2/17 Hypotensive and back on Levophed 2/18 trach, lines changed 2/19 started milrinone , fever 103 2/20 Episode of wide-complex tachycardia yesterday, changed from Levophed to vasopressin 2/22 stopping vanc. Still on inotrope support. Some AF w/ RVR. Had to be placed back on pressors. ivabradine added 2/23 still on pressors/ CRRT continued  Consults:  Neuro IR Cardiology  Nephrology Heart Failure  EP   Procedures:  ETT 2/05 >> 2/18 [facility] CVL 2/5 >>2/18  RT [place] CVL 2/18 >> RIJ HD cath 2/6 >>2/18  RT fem HD 2/19 >>  2/5-2/6:  TPA given at 428 am (total of 90 Mg) 520 am went to IR  S/P Lt common carotid arteriogram followed by complete revascularization of occluded LT MCA sup division mid M2 seg with x 1 pass with 60mx 40 mm solitaire X ret river device and penumbra aspiration with TICI 3  revascularization   Significant Diagnostic Tests:  CT angio head/neck 2/05 >> occlusion of Lt MCA bifurcation Echo 2/05 >> EF less than 20%, cannot rule out apical thrombus MRI brain 2/11 > extensive acute infarction of multiple areas without large or medium vessel occlusion Echocardiogram 2/8 Left ventricular ejection fraction, by estimation, is <20%. The left  ventricle has severely decreased function. The left ventricle demonstrates  global hypokinesis. The left ventricular internal cavity size was severely  dilated. Possible small 0.8 x  0.6 cm apical thrombus. Somewhat subtle finding, could confirm with  cardiac MRI. However, if patient has had CVA, would be reasonable to  anticoagulate given severity of LV dysfunction if no contraindications to  anticoagulation.   Micro Data:  SARS CoV2 PCR 2/05 >> negative Influenza PCR 2/05 >> negative Urine 2/6 >> ng resp 2/6 >> nml flora BC 2/6 >>ng resp 2/16 >> ng [facility] >>  Antimicrobials:  mero 2/6 -2/12 Zosyn 2/6 , 2/19 >> 2/23 vanc 2/6 >> 2/9 , 2/19 >>2/22  Interim history/subjective:  tmax 98.4 Remains on CRRT, even UF, remains anuric Levophed at 14 mcg/min Milrinone at 0.125 mcg/kg/min Coox 63.4 Doing well this morning on ATC, no events overnight.   Objective   Blood pressure 96/80, pulse 95, temperature 98.4 F (36.9 C), temperature source Axillary, resp. rate (!) 22, height 6' 1"  (1.854 m), weight 105.4 kg, SpO2 100 %.    FiO2 (%):  [28 %] 28 %   Intake/Output Summary (Last 24 hours) at 05/11/2019 0815 Last data filed at 05/11/2019 0800 Gross  per 24 hour  Intake 3970.34 ml  Output 5268 ml  Net -1297.66 ml   Filed Weights   05/09/19 0500 05/10/19 0500 05/11/19 0500  Weight: 106.4 kg 108 kg 105.4 kg    Examination: General:  Adult AAM sitting upright in bed in NAD HEENT: MM pink/moist, pupils 4/reactive, right nare cortrak, midline sutured 6 shiley trach, deflated cuff -wnl Neuro: Awake, follows intermittent  commands- more so in lower extremities, moves all extremities spont, left more than right  CV: rr, slightly ST PULM:  Non labored, clear anteriorly, diminished in bases, no secretions GI: soft, NT, bs active, flexiseal remains in  Extremities: warm/dry, ** edema  Skin: no rashes or lesions  Resolved Hospital Problem list     Assessment & Plan:   Left MCA CVA Multiple other infarcts noted on MRI 8/84-[id number] embolic Acute metabolic encephalopathy Plan Continuing supportive care PT/ OT efforts when able HS Seroquel, monitor QTc  Acute systolic HF with cardiogenic shock in setting of NICM, ejection fraction is less than 20% Plan Milrinone per HF  daily coox  Levophed for goal SBP > 90 (changing UF to even, hopefully will help weaning efforts)   Left ventricle apical thrombus non ischemic cardiomyopathy with systolic congestive heart failure Polymorphic VT on 2/9, WCT 2/19 HLD Plan Tele monitoring  Per HF team, appreciate input Continue amiodarone PO ivabradine per HF/ cards  Ongoing bivalirudin Daily ASA, lipitor  Fever/ Leukocytosis and possible HCAP- resolved  -CVL changed 2/18 -sputum neg Plan Monitor off abx Follow cultures Trend WBC/ fever curve   Acute hypoxemic respiratory failure now tracheostomy dependent after stroke Tolerating ATC, last MV 2/21 Plan Ongoing pulmonary hygiene Trach care Will need trach sutures removed 2/25 SLP following  Acute kidney injury. Still anuric  Plan Continue CRRT, even UF Bladder scan prn  Daily renal panel/ mag  Thrombocytopenia - HIT +, await SRA, platelets recovered on bivalirudin Plan Avoiding heparin Trend CBC  DM type II poorly controlled Plan Ongoing CBGs in 200's levemir 25 units BID, increase to 30 units BID SSI resistant  TF coverage 10 units q 4hr CBG goal 140-180  Hx of cocaine abuse. Depression. - not active per UDS  Plan Restart Prozac  Best practice:  Diet: NPO,  TFs DVT prophylaxis:  SCDs GI prophylaxis: protonix Mobility: bed rest Code Status: full code Disposition: ICU  Family -fianc [name] updated 2/21, will update at bedside  CCT: 30 mins   Kennieth Rad, MSN, AGACNP-BC [facility] 05/11/2019, 8:15 AM

## 2019-05-11 NOTE — Progress Notes (Signed)
ANTICOAGULATION CONSULT NOTE  Pharmacy Consult:  Bivalirudin Indication: stroke 2/5, LV thrombus 2/8, HIT 2/15   Allergies  Allergen Reactions  . Heparin Other (See Comments)    Heparin induced thrombocytopenia. 2/15 HIT OD 1.692.     Patient Measurements: Height: 6\' 1"  (185.4 cm) Weight: 232 lb 5.8 oz (105.4 kg) IBW/kg (Calculated) : 79.9   Vital Signs: Temp: 98.4 F (36.9 C) (02/24 0400) Temp Source: Axillary (02/24 0400) BP: 104/79 (02/24 0743) Pulse Rate: 95 (02/24 0743)  Labs: Recent Labs    05/09/19 0500 05/09/19 0631 05/10/19 0504 05/10/19 0757 05/10/19 2155 05/10/19 2155 05/11/19 0210 05/11/19 0214 05/11/19 0445  HGB 9.9*   < >  --    < > 15.0   < > 15.0 13.6  --   HCT 32.3*   < >  --    < > 44.0  --  44.0 40.0  --   PLT 419*  --   --   --   --   --   --   --   --   APTT 58*  --  50*  --   --   --   --   --  47*  CREATININE 3.32*   < > 3.47*   < > 2.80*   < > 2.60* 3.40* 3.41*   < > = values in this interval not displayed.    Estimated Creatinine Clearance: 38.9 mL/min (A) (by C-G formula based on SCr of 3.41 mg/dL (H)).  Assessment: 34 yr old male with left MCA infarct - received TPA and revascularization with IR on 2/5. On 2/8 found to have small LV apical thrombus on ECHO. Pharmacy consulted to dose IV heparin, which was switched to bivalirudin 2/15 for HIT.  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 SRA pending (drawn 2/16).    APTT slightly subtherapeutic; no issue with infusion.  No bleeding reported.  Goal of Therapy:  APTT 50- 65 sec  Monitor platelets by anticoagulation protocol: Yes   Plan:  Increase bivalirudin at 0.07 mg/kg/hr (AdjBW) Daily aPTT and CBC F/u SRA  Alanda Slim, PharmD, [facility] Clinical Pharmacist Please see AMION for all Pharmacists' Contact Phone Numbers 05/11/2019, 7:57 AM

## 2019-05-12 DIAGNOSIS — J9600 Acute respiratory failure, unspecified whether with hypoxia or hypercapnia: Secondary | ICD-10-CM | POA: Diagnosis not present

## 2019-05-12 DIAGNOSIS — R579 Shock, unspecified: Secondary | ICD-10-CM | POA: Diagnosis not present

## 2019-05-12 DIAGNOSIS — N170 Acute kidney failure with tubular necrosis: Secondary | ICD-10-CM | POA: Diagnosis not present

## 2019-05-12 DIAGNOSIS — R570 Cardiogenic shock: Secondary | ICD-10-CM | POA: Diagnosis not present

## 2019-05-12 DIAGNOSIS — I639 Cerebral infarction, unspecified: Secondary | ICD-10-CM | POA: Diagnosis not present

## 2019-05-12 DIAGNOSIS — N179 Acute kidney failure, unspecified: Secondary | ICD-10-CM | POA: Diagnosis not present

## 2019-05-12 LAB — POCT I-STAT, CHEM 8
BUN: 58 mg/dL — ABNORMAL HIGH (ref 6–20)
BUN: 86 mg/dL — ABNORMAL HIGH (ref 6–20)
BUN: 54 mg/dL — ABNORMAL HIGH (ref 6–20)
BUN: 78 mg/dL — ABNORMAL HIGH (ref 6–20)
BUN: 56 mg/dL — ABNORMAL HIGH (ref 6–20)
BUN: 76 mg/dL — ABNORMAL HIGH (ref 6–20)
BUN: 50 mg/dL — ABNORMAL HIGH (ref 6–20)
BUN: 70 mg/dL — ABNORMAL HIGH (ref 6–20)
Calcium, Ion: 0.39 mmol/L — CL (ref 1.15–1.40)
Calcium, Ion: 1.01 mmol/L — ABNORMAL LOW (ref 1.15–1.40)
Calcium, Ion: 0.44 mmol/L — CL (ref 1.15–1.40)
Calcium, Ion: 1.04 mmol/L — ABNORMAL LOW (ref 1.15–1.40)
Calcium, Ion: 0.37 mmol/L — CL (ref 1.15–1.40)
Calcium, Ion: 1.05 mmol/L — ABNORMAL LOW (ref 1.15–1.40)
Calcium, Ion: 0.4 mmol/L — CL (ref 1.15–1.40)
Calcium, Ion: 1.05 mmol/L — ABNORMAL LOW (ref 1.15–1.40)
Chloride: 101 mmol/L (ref 98–111)
Chloride: 103 mmol/L (ref 98–111)
Chloride: 101 mmol/L (ref 98–111)
Chloride: 103 mmol/L (ref 98–111)
Chloride: 100 mmol/L (ref 98–111)
Chloride: 104 mmol/L (ref 98–111)
Chloride: 100 mmol/L (ref 98–111)
Chloride: 102 mmol/L (ref 98–111)
Creatinine, Ser: 4.1 mg/dL — ABNORMAL HIGH (ref 0.61–1.24)
Creatinine, Ser: 6.2 mg/dL — ABNORMAL HIGH (ref 0.61–1.24)
Creatinine, Ser: 3.8 mg/dL — ABNORMAL HIGH (ref 0.61–1.24)
Creatinine, Ser: 6 mg/dL — ABNORMAL HIGH (ref 0.61–1.24)
Creatinine, Ser: 3.5 mg/dL — ABNORMAL HIGH (ref 0.61–1.24)
Creatinine, Ser: 6 mg/dL — ABNORMAL HIGH (ref 0.61–1.24)
Creatinine, Ser: 3.4 mg/dL — ABNORMAL HIGH (ref 0.61–1.24)
Creatinine, Ser: 5.3 mg/dL — ABNORMAL HIGH (ref 0.61–1.24)
Glucose, Bld: 317 mg/dL — ABNORMAL HIGH (ref 70–99)
Glucose, Bld: 321 mg/dL — ABNORMAL HIGH (ref 70–99)
Glucose, Bld: 259 mg/dL — ABNORMAL HIGH (ref 70–99)
Glucose, Bld: 269 mg/dL — ABNORMAL HIGH (ref 70–99)
Glucose, Bld: 268 mg/dL — ABNORMAL HIGH (ref 70–99)
Glucose, Bld: 277 mg/dL — ABNORMAL HIGH (ref 70–99)
Glucose, Bld: 216 mg/dL — ABNORMAL HIGH (ref 70–99)
Glucose, Bld: 240 mg/dL — ABNORMAL HIGH (ref 70–99)
HCT: 37 % — ABNORMAL LOW (ref 39.0–52.0)
HCT: 40 % (ref 39.0–52.0)
HCT: 37 % — ABNORMAL LOW (ref 39.0–52.0)
HCT: 39 % (ref 39.0–52.0)
HCT: 37 % — ABNORMAL LOW (ref 39.0–52.0)
HCT: 40 % (ref 39.0–52.0)
HCT: 37 % — ABNORMAL LOW (ref 39.0–52.0)
HCT: 40 % (ref 39.0–52.0)
Hemoglobin: 12.6 g/dL — ABNORMAL LOW (ref 13.0–17.0)
Hemoglobin: 13.6 g/dL (ref 13.0–17.0)
Hemoglobin: 12.6 g/dL — ABNORMAL LOW (ref 13.0–17.0)
Hemoglobin: 13.3 g/dL (ref 13.0–17.0)
Hemoglobin: 12.6 g/dL — ABNORMAL LOW (ref 13.0–17.0)
Hemoglobin: 13.6 g/dL (ref 13.0–17.0)
Hemoglobin: 12.6 g/dL — ABNORMAL LOW (ref 13.0–17.0)
Hemoglobin: 13.6 g/dL (ref 13.0–17.0)
Potassium: 4 mmol/L (ref 3.5–5.1)
Potassium: 4.1 mmol/L (ref 3.5–5.1)
Potassium: 3.9 mmol/L (ref 3.5–5.1)
Potassium: 3.9 mmol/L (ref 3.5–5.1)
Potassium: 3.8 mmol/L (ref 3.5–5.1)
Potassium: 4.1 mmol/L (ref 3.5–5.1)
Potassium: 3.7 mmol/L (ref 3.5–5.1)
Potassium: 3.7 mmol/L (ref 3.5–5.1)
Sodium: 139 mmol/L (ref 135–145)
Sodium: 141 mmol/L (ref 135–145)
Sodium: 140 mmol/L (ref 135–145)
Sodium: 141 mmol/L (ref 135–145)
Sodium: 139 mmol/L (ref 135–145)
Sodium: 140 mmol/L (ref 135–145)
Sodium: 140 mmol/L (ref 135–145)
Sodium: 141 mmol/L (ref 135–145)
TCO2: 23 mmol/L (ref 22–32)
TCO2: 23 mmol/L (ref 22–32)
TCO2: 24 mmol/L (ref 22–32)
TCO2: 24 mmol/L (ref 22–32)
TCO2: 24 mmol/L (ref 22–32)
TCO2: 25 mmol/L (ref 22–32)
TCO2: 25 mmol/L (ref 22–32)
TCO2: 26 mmol/L (ref 22–32)

## 2019-05-12 LAB — CBC
HCT: 38 % — ABNORMAL LOW (ref 39.0–52.0)
Hemoglobin: 11.8 g/dL — ABNORMAL LOW (ref 13.0–17.0)
MCH: 30.8 pg (ref 26.0–34.0)
MCHC: 31.1 g/dL (ref 30.0–36.0)
MCV: 99.2 fL (ref 80.0–100.0)
Platelets: 625 10*3/uL — ABNORMAL HIGH (ref 150–400)
RBC: 3.83 MIL/uL — ABNORMAL LOW (ref 4.22–5.81)
RDW: 14.6 % (ref 11.5–15.5)
WBC: 21.4 10*3/uL — ABNORMAL HIGH (ref 4.0–10.5)
nRBC: 0.3 % — ABNORMAL HIGH (ref 0.0–0.2)

## 2019-05-12 LAB — RENAL FUNCTION PANEL
Albumin: 2.9 g/dL — ABNORMAL LOW (ref 3.5–5.0)
Albumin: 2.8 g/dL — ABNORMAL LOW (ref 3.5–5.0)
Anion gap: 18 — ABNORMAL HIGH (ref 5–15)
Anion gap: 18 — ABNORMAL HIGH (ref 5–15)
BUN: 71 mg/dL — ABNORMAL HIGH (ref 6–20)
BUN: 81 mg/dL — ABNORMAL HIGH (ref 6–20)
CO2: 22 mmol/L (ref 22–32)
CO2: 22 mmol/L (ref 22–32)
Calcium: 10 mg/dL (ref 8.9–10.3)
Calcium: 9.7 mg/dL (ref 8.9–10.3)
Chloride: 98 mmol/L (ref 98–111)
Chloride: 98 mmol/L (ref 98–111)
Creatinine, Ser: 4.89 mg/dL — ABNORMAL HIGH (ref 0.61–1.24)
Creatinine, Ser: 5.8 mg/dL — ABNORMAL HIGH (ref 0.61–1.24)
GFR calc Af Amer: 17 mL/min — ABNORMAL LOW (ref >60)
GFR calc Af Amer: 14 mL/min — ABNORMAL LOW (ref >60)
GFR calc non Af Amer: 14 mL/min — ABNORMAL LOW (ref >60)
GFR calc non Af Amer: 12 mL/min — ABNORMAL LOW (ref >60)
Glucose, Bld: 226 mg/dL — ABNORMAL HIGH (ref 70–99)
Glucose, Bld: 285 mg/dL — ABNORMAL HIGH (ref 70–99)
Phosphorus: 6.2 mg/dL — ABNORMAL HIGH (ref 2.5–4.6)
Phosphorus: 6.1 mg/dL — ABNORMAL HIGH (ref 2.5–4.6)
Potassium: 4.2 mmol/L (ref 3.5–5.1)
Potassium: 3.9 mmol/L (ref 3.5–5.1)
Sodium: 138 mmol/L (ref 135–145)
Sodium: 138 mmol/L (ref 135–145)

## 2019-05-12 LAB — GLUCOSE, CAPILLARY
Glucose-Capillary: 185 mg/dL — ABNORMAL HIGH (ref 70–99)
Glucose-Capillary: 199 mg/dL — ABNORMAL HIGH (ref 70–99)
Glucose-Capillary: 226 mg/dL — ABNORMAL HIGH (ref 70–99)
Glucose-Capillary: 261 mg/dL — ABNORMAL HIGH (ref 70–99)
Glucose-Capillary: 265 mg/dL — ABNORMAL HIGH (ref 70–99)
Glucose-Capillary: 311 mg/dL — ABNORMAL HIGH (ref 70–99)

## 2019-05-12 LAB — C DIFFICILE QUICK SCREEN W PCR REFLEX
C Diff antigen: NEGATIVE
C Diff interpretation: NOT DETECTED
C Diff toxin: NEGATIVE

## 2019-05-12 LAB — CORTISOL: Cortisol, Plasma: 27.4 ug/dL

## 2019-05-12 LAB — COOXEMETRY PANEL
Carboxyhemoglobin: 2.3 % — ABNORMAL HIGH (ref 0.5–1.5)
Methemoglobin: 0.9 % (ref 0.0–1.5)
O2 Saturation: 92.7 %
Total hemoglobin: 11.9 g/dL — ABNORMAL LOW (ref 12.0–16.0)

## 2019-05-12 LAB — CALCIUM, IONIZED: Calcium, Ionized, Serum: 4.9 mg/dL (ref 4.5–5.6)

## 2019-05-12 LAB — APTT: aPTT: 46 seconds — ABNORMAL HIGH (ref 24–36)

## 2019-05-12 LAB — MAGNESIUM: Magnesium: 2.6 mg/dL — ABNORMAL HIGH (ref 1.7–2.4)

## 2019-05-12 MED ORDER — PRISMASOL B22GK 4/0 22-4 MEQ/L IV SOLN
INTRAVENOUS | Status: DC
  Filled 2019-05-12 (×73): qty 5000

## 2019-05-12 MED ORDER — DEXTROSE 5 % IV SOLN
20.0000 g | INTRAVENOUS | Status: DC
  Administered 2019-05-12 – 2019-05-17 (×6): 20 g via INTRAVENOUS_CENTRAL
  Filled 2019-05-12 (×11): qty 200

## 2019-05-12 MED ORDER — PRISMASOL B22GK 4/0 22-4 MEQ/L REPLACEMENT SOLN
Status: DC
  Filled 2019-05-12 (×12): qty 5000

## 2019-05-12 MED ORDER — ACD FORMULA A 0.73-2.45-2.2 GM/100ML VI SOLN
3000.0000 mL | Status: DC
  Filled 2019-05-12: qty 3000

## 2019-05-12 MED ORDER — SODIUM CHLORIDE 0.9 % IV BOLUS
250.0000 mL | Freq: Once | INTRAVENOUS | Status: AC
  Administered 2019-05-12: 23:00:00 250 mL via INTRAVENOUS

## 2019-05-12 MED ORDER — DEXTROSE 5 % IV SOLN
Status: DC
  Filled 2019-05-12 (×24): qty 1500

## 2019-05-12 NOTE — Progress Notes (Signed)
  Speech Language Pathology Treatment: Nada Boozer Speaking valve  Patient Details Name: Carlos Brewer. MRN: [id number] DOB: [birth year] Today's Date: 05/12/2019 Time: [id number] SLP Time Calculation (min) (ACUTE ONLY): 16 min  Assessment / Plan / Recommendation Clinical Impression  Pt was alert and upright with fiance videochatting into his room upon SLP arrival. SLP placed PMV again with good tolerance but no phonation observed despite Max cues. His fiance shared that he really enjoys Demetrius Charity, so SLP played two of his favorite songs. As each song started pt immediately focused his attention to the phone playing the music, showing expression and seemingly recognition through his eyes and smiling. He did not hum or sing to the music but tapped his foot a few times in a row when cued to do so. PMV was removed upon completion of session. Would continue to place it intermittently with staff supervision.   HPI HPI: 34 yo male smoker found to have slurred speech and Rt sided weakness.  CT head showed M2 occlusion.  Ultimately treated with thrombolytic and thrombectomy by IR.  Required intubation for airway protection. Course complicated by septic shock, AKI requiring CRRT and polymorphic VT. Intubated 2/5-2/18.      SLP Plan  Continue with current plan of care       Recommendations         Patient may use Passy-Muir Speech Valve: Intermittently with supervision PMSV Supervision: Full         Oral Care Recommendations: Oral care QID Follow up Recommendations: (tba) SLP Visit Diagnosis: Aphonia (R49.1) Plan: Continue with current plan of care       GO                 Osie Bond., M.A. [facility] Pager [phone] Office [phone]  05/12/2019, 9:49 AM

## 2019-05-12 NOTE — Progress Notes (Signed)
RT NOTE: Trach sutures removed per protocol as patient is day 7 post trach procedure. Trach care completed and trach ties changed.Lurline Idol is secure. Vitals are stable. RT will continue to monitor.

## 2019-05-12 NOTE — Progress Notes (Signed)
NAME:  Carlos Streety., MRN:  [id number], DOB:  30-Oct-1985, LOS: 32 ADMISSION DATE:  04/20/2019, CONSULTATION DATE:  04/25/2019 REFERRING MD:  Dr. Leonel Ramsay, CHIEF COMPLAINT:  Slurred speech  Brief History   34 yo male smoker found to have slurred speech and Rt sided weakness.  Admitted with left MCA CVA and multiple smaller embolic infarcts s/p tPA and thrombectomy in IR.  Additionally found to have left ventricular atrial thrombus.    Course complicated by septic shock r/t HCAP, AKI requiring CRRT, polymorphic VT and cardiogenic shock.   Past Medical History  Systolic CHF with non ischemic CM, Cocaine abuse, OSA, DM Hx of CHF (EF 15%)  Hx myocarditis Smoker 1/2 ppd   Significant Hospital Events   2/05 Admit, tPA, IR thrombectomy 2/6 100% on fvent at 2300  Overnight requiring increasing levophed and phenylephrine.    2/6: switched to levophed, epi, started antibiotics, started on CRRT.  2/9  developed wide-complex tachycardia and hypotension, started on amiodarone drip, increase Levophed drip 2/15 drop in platelets, heparin stopped, bival started 2/17 Hypotensive and back on Levophed 2/18 trach, lines changed 2/19 started milrinone , fever 103 2/20 Episode of wide-complex tachycardia yesterday, changed from Levophed to vasopressin 2/22 stopping vanc. Still on inotrope support. Some AF w/ RVR. Had to be placed back on pressors. ivabradine added 2/23 still on pressors/ CRRT continued 2/24 tmax 98.4, Remains on CRRT, even UF, remains anuric, Levophed at 14 mcg/min, Milrinone at 0.125 mcg/kg/min, Coox 63.4 Doing well this morning on ATC, no events overnight.  Midodrine added    Consults:  Neuro IR Cardiology  Nephrology Heart Failure  EP   Procedures:  ETT 2/05 >> 2/18 [facility] CVL 2/5 >>2/18  RT [place] CVL 2/18 >> RIJ HD cath 2/6 >>2/18  RT fem HD 2/19 >>  2/5-2/6:  TPA given at 428 am (total of 90 Mg) 520 am went to IR  S/P Lt common carotid arteriogram followed by  complete revascularization of occluded LT MCA sup division mid M2 seg with x 1 pass with 70mx 40 mm solitaire X ret river device and penumbra aspiration with TICI 3 revascularization   Significant Diagnostic Tests:  CT angio head/neck 2/05 >> occlusion of Lt MCA bifurcation Echo 2/05 >> EF less than 20%, cannot rule out apical thrombus MRI brain 2/11 > extensive acute infarction of multiple areas without large or medium vessel occlusion Echocardiogram 2/8 Left ventricular ejection fraction, by estimation, is <20%. The left  ventricle has severely decreased function. The left ventricle demonstrates  global hypokinesis. The left ventricular internal cavity size was severely  dilated. Possible small 0.8 x  0.6 cm apical thrombus. Somewhat subtle finding, could confirm with  cardiac MRI. However, if patient has had CVA, would be reasonable to  anticoagulate given severity of LV dysfunction if no contraindications to  anticoagulation.   Micro Data:  SARS CoV2 PCR 2/05 >> negative Influenza PCR 2/05 >> negative Urine 2/6 >> ng resp 2/6 >> nml flora BC 2/6 >>ng resp 2/16 >> [facility] >> negative   Antimicrobials:  mero 2/6 -2/12 Zosyn 2/6 , 2/19 >> 2/23 vanc 2/6 >> 2/9 , 2/19 >>2/22  Interim history/subjective:   Alert, following commands fully today  Remains on trach collar at 28% FiO2  tmax 99.5/ WBC 21.4 ~900 ml liquid stool/ 24 hours Levophed up to 20 mcg/min milrinone remains at 0.125 mcg/kg/min coox 92.7 Off CRRT since last night ~2000 s/p clotted x 3 off citrate  Objective  Blood pressure 105/61, pulse 94, temperature 99.5 F (37.5 C), temperature source Axillary, resp. rate (!) 27, height 6' 1"  (1.854 m), weight 105.7 kg, SpO2 95 %.    FiO2 (%):  [28 %] 28 %   Intake/Output Summary (Last 24 hours) at 05/12/2019 0751 Last data filed at 05/12/2019 0700 Gross per 24 hour  Intake 3288.94 ml  Output 2288 ml  Net 1000.94 ml   Filed Weights   05/10/19 0500 05/11/19  0500 05/12/19 0500  Weight: 108 kg 105.4 kg 105.7 kg    Examination:  General:  Young adult male sitting upright in bed in NAD, tracking me, more interactive today  HEENT: MM pink/moist, pupils 4/reactive, midline 6 shiley cuffed trach- sutured  Neuro:  Awake, tracks, following commands in all extremities today, moves LE spont more than UE CV: rr PULM:  Non labored on ATC, minimal insp wheeze/ slightly coarse, strong cough with minimal amount of tan secretions GI: obese, soft, NT/ ND, +bs, flexiseal remains  Extremities: warm/dry, +1 edema  Skin: no rashes    Resolved Hospital Problem list     Assessment & Plan:   Left MCA CVA Multiple other infarcts noted on MRI 9/45-OPFYTW embolic Acute metabolic encephalopathy Plan Ongoing supportive care Push PT/ OT efforts when able- limited by femoral HD cath  qHS seroquel  Acute systolic HF with cardiogenic shock in setting of NICM, ejection fraction is less than 20% Plan Milrinone per HF  daily coox  Midodrine added 2/24, 5 mg TID to help wean pressor requirement Levophed for goal SBP > 90, unable to wean over last 24 hours  Check cortisol  Left ventricle apical thrombus non ischemic cardiomyopathy with systolic congestive heart failure Polymorphic VT on 2/9, WCT 2/19 HLD Plan Tele monitoring  Cards following Remains on PO amiodarone, and ivabradine Daily ASA, lipitor Goal K> 4, Mag >2  Leukocytosis - treated for HCAP 2/19- 2/23 zosyn/ vanc Plan tmax 99.5 but WBC up and given ongoing and increased pressor requirement, concern for developing sepsis remains in differential  Recent cultures from 2/18, 2/19 negative after line holiday  If spikes fever, re-culture  Send stool for Cdiff given increased amounts of watery diarrhea Trend WBC/ fever curve   Acute hypoxemic respiratory failure now tracheostomy dependent after stroke Tolerating ATC, last on MV 2/21 Plan Aggressive pulm hygiene Trach care  Sutures to be removed  from trach today with ongoing trach care.   Consider changing to cuffless trach in several days if he continues to do well  SLP following  Intermittent CXR Prn nebs   Acute kidney injury. Still anuric  - CRRT started 2/6 [facility], starting CRRT back this am with citrate Bladder scan prn  Daily renal panel/ mag  Will need to consider long term goals if patient is unable to transition off CRRT and changing HD cath site, currently femoral in the near future.   Thrombocytopenia - HIT + (2/15) - platelets recovered Plan Avoiding heparin Continue bivalirudin Trending CBC  DM type II poorly controlled Plan Improved control from yesterday  Continue levemir 30 units BID  SSI resistant  TF coverage 10 units q 4hr  Hx of cocaine abuse. Depression. - not active per UDS  Plan Continue  Prozac  Best practice:  Diet: NPO,  TFs DVT prophylaxis: SCDs/ bivalirudin GI prophylaxis: protonix Mobility: progress when able Code Status: full code Disposition: ICU  Family -fianc [name] updated by Dr. Lamonte Sakai 2/24. Pending for today.   CCT: 45 mins  Kennieth Rad, MSN, AGACNP-BC [facility] 05/12/2019, 7:51 AM

## 2019-05-12 NOTE — Progress Notes (Signed)
Assisted tele visit to patient with partner. Adella Hare, RN.

## 2019-05-12 NOTE — Progress Notes (Signed)
ANTICOAGULATION CONSULT NOTE  Pharmacy Consult:  Bivalirudin Indication: stroke 2/5, LV thrombus 2/8, HIT 2/15   Allergies  Allergen Reactions  . Heparin Other (See Comments)    Heparin induced thrombocytopenia. 2/15 HIT OD 1.692.     Patient Measurements: Height: 6\' 1"  (185.4 cm) Weight: 233 lb 0.4 oz (105.7 kg) IBW/kg (Calculated) : 79.9   Vital Signs: Temp: 99.5 F (37.5 C) (02/25 0400) Temp Source: Axillary (02/25 0400) BP: 121/67 (02/25 0806) Pulse Rate: 101 (02/25 0806)  Labs: Recent Labs    05/10/19 0504 05/10/19 0757 05/11/19 0214 05/11/19 0214 05/11/19 0445 05/11/19 0825 05/11/19 1645 05/12/19 0206 05/12/19 0207  HGB  --    < > 13.6   < >  --  14.3  --  11.8*  --   HCT  --    < > 40.0  --   --  42.0  --  38.0*  --   PLT  --   --   --   --   --   --   --  625*  --   APTT 50*  --   --   --  47*  --   --  46*  --   CREATININE 3.47*   < > 3.40*   < > 3.41* 3.50* 3.29*  --  4.89*   < > = values in this interval not displayed.    Estimated Creatinine Clearance: 27.2 mL/min (A) (by C-G formula based on SCr of 4.89 mg/dL (H)).  Assessment: 34 yr old male with left MCA infarct - received TPA and revascularization with IR on 2/5. On 2/8 found to have small LV apical thrombus on ECHO. Pharmacy consulted to dose IV heparin, which was switched to bivalirudin 2/15 for HIT.  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 SRA pending (drawn 2/16).    APTT slightly subtherapeutic; no issue with infusion.  No bleeding reported.  Goal of Therapy:  APTT 50- 65 sec  Monitor platelets by anticoagulation protocol: Yes   Plan:  Increase bivalirudin at 0.08 mg/kg/hr (AdjBW) Daily aPTT and CBC F/u SRA from 2/16  Alanda Slim, PharmD, [facility] Clinical Pharmacist Please see AMION for all Pharmacists' Contact Phone Numbers 05/12/2019, 9:48 AM

## 2019-05-12 NOTE — Progress Notes (Addendum)
Advanced Heart Failure Rounding Note   Subjective:    Continues w/ trach collar   Continues w/ significant neurological deficits but able to follow some commands. Slow improvement. Now nodding head and smiling. Remains nonverbal.   Remains on Milrinone 0.125 and still requiring high dose of NE, currently 12 mcg. Midodrine added yesterday. MAPs in the 75s. Co-ox 93%  Remains anuric and CRRT dependent. CRRT clotted last night and held. Has been restarted.  500 cc IVF given last PM for low CVP. CVP 2 today  WBC ct trending up 11>>21 but AF.   HR improved w/ ivabradine. Currently NSR, HR 70s. Less frequent PVCs.   Mother at bedside.    Objective:   Weight Range:  Vital Signs:   Temp:  [98.3 F (36.8 C)-99.5 F (37.5 C)] 98.5 F (36.9 C) (02/25 0800) Pulse Rate:  [37-103] 72 (02/25 1517) Resp:  [16-35] 23 (02/25 1517) BP: (77-121)/(45-77) 108/71 (02/25 1517) SpO2:  [91 %-100 %] 96 % (02/25 1517) FiO2 (%):  [28 %] 28 % (02/25 1517) Weight:  [105.7 kg] 105.7 kg (02/25 0500) Last BM Date: 05/12/19  Weight change: Filed Weights   05/10/19 0500 05/11/19 0500 05/12/19 0500  Weight: 108 kg 105.4 kg 105.7 kg    Intake/Output:   Intake/Output Summary (Last 24 hours) at 05/12/2019 1603 Last data filed at 05/12/2019 1500 Gross per 24 hour  Intake 2801.32 ml  Output 1214 ml  Net 1587.32 ml     Physical Exam: General:  Awake on trach. Will track and follow some simple commands HEENT: normal Neck: supple. + trach collar  Carotids 2+ bilat; no bruits. No lymphadenopathy or thryomegaly appreciated. Cor: PMI nondisplaced. RRR + s3 Lungs: clear Abdomen: obese, soft, nontender, nondistended. No hepatosplenomegaly. No bruits or masses. Good bowel sounds. Extremities: no cyanosis, clubbing, rash, edema Neuro: Awake on trach. Will track and follow some simple commands. Non verbal   Telemetry: currently NSR 70s. Few PVCs Personally reviewed   Labs: Basic Metabolic  Panel: Recent Labs  Lab 05/06/19 0500 05/06/19 0807 05/07/19 0206 05/07/19 0246 05/09/19 2215 05/09/19 2252 05/10/19 8832 05/10/19 0757 05/10/19 1557 05/10/19 1559 05/11/19 0445 05/11/19 0825 05/11/19 1645 05/12/19 0206 05/12/19 0207 05/12/19 1411  NA 134*   < >  --    < > 137   < > 140   < > 139   < > 140 138 138  --  138 139  K 7.3*   < >  --    < > 3.6   < > 3.5   < > 3.8   < > 3.9 3.9 3.9  --  4.2 4.1  CL 97*   < >  --    < > 94*   < > 92*   < > 91*   < > 88* 92* 98  --  98 103  CO2 20*   < >  --    < > 25   < > 27  --  28  --  30  --  24  --  22  --   GLUCOSE 171*   < >  --    < > 321*   < > 195*   < > 263*   < > 226* 281* 223*  --  226* 321*  BUN 110*   < >  --    < > 42*   < > 37*   < > 38*   < > 44* 40* 43*  --  71* 86*  CREATININE 6.23*   < >  --    < > 3.28*   < > 3.47*   < > 3.31*   < > 3.41* 3.50* 3.29*  --  4.89* 6.20*  CALCIUM 9.2   < >  --    < > 9.4   < > 10.0   < > 10.6*   < > 11.0*  --  9.9  --  10.0  --   MG 2.3  --  2.2  --  1.9  --   --   --   --   --  2.4  --   --  2.6*  --   --   PHOS 7.2*   < >  --    < >  --   --  4.1  --  4.4  --  5.7*  --  5.7*  --  6.2*  --    < > = values in this interval not displayed.    Liver Function Tests: Recent Labs  Lab 05/09/19 0500 05/09/19 1655 05/10/19 0504 05/10/19 1557 05/11/19 0445 05/11/19 1645 05/12/19 0207  AST 44*  --   --   --   --   --   --   ALT 78*  --   --   --   --   --   --   ALKPHOS 164*  --   --   --   --   --   --   BILITOT 1.4*  --   --   --   --   --   --   PROT 8.2*  --   --   --   --   --   --   ALBUMIN 2.6*   < > 2.9* 2.9* 3.1* 2.9* 2.9*   < > = values in this interval not displayed.   No results for input(s): LIPASE, AMYLASE in the last 168 hours. No results for input(s): AMMONIA in the last 168 hours.  CBC: Recent Labs  Lab 05/06/19 0500 05/06/19 1558 05/07/19 0246 05/07/19 0343 05/08/19 0307 05/08/19 0816 05/09/19 0500 05/09/19 0631 05/11/19 0210 05/11/19 0214  05/11/19 0825 05/12/19 0206 05/12/19 1411  WBC 15.6*  --  14.0*  --  18.3*  --  11.1*  --   --   --   --  21.4*  --   HGB 9.7*   < > 8.4*   < > 9.3*   < > 9.9*   < > 15.0 13.6 14.3 11.8* 12.6*  HCT 31.1*   < > 26.4*   < > 29.2*   < > 32.3*   < > 44.0 40.0 42.0 38.0* 37.0*  MCV 97.2  --  95.3  --  97.0  --  98.5  --   --   --   --  99.2  --   PLT 165  --  194  --  362  --  419*  --   --   --   --  625*  --    < > = values in this interval not displayed.    Cardiac Enzymes: No results for input(s): CKTOTAL, CKMB, CKMBINDEX, TROPONINI in the last 168 hours.  BNP: BNP (last 3 results) Recent Labs    12/13/18 1750 12/15/18 0405 04/23/19 0945  BNP 1,156.1* 1,161.0* 2,909.9*    ProBNP (last 3 results) No results for input(s): PROBNP in the last 8760 hours.    Other results:  Imaging: No results found.   Medications:     Scheduled Medications: . sodium chloride   Intravenous Once  . amiodarone  200 mg Per Tube BID  . aspirin  81 mg Per Tube Daily  . atorvastatin  40 mg Per Tube q1800  . chlorhexidine gluconate (MEDLINE KIT)  15 mL Mouth Rinse BID  . Chlorhexidine Gluconate Cloth  6 each Topical Q0600  . feeding supplement (PRO-STAT SUGAR FREE 64)  60 mL Per Tube TID  . FLUoxetine  20 mg Per Tube Daily  . insulin aspart  0-20 Units Subcutaneous Q4H  . insulin aspart  10 Units Subcutaneous Q4H  . insulin detemir  30 Units Subcutaneous BID  . ivabradine  5 mg Per Tube BID WC  . mouth rinse  15 mL Mouth Rinse 10 times per day  . midodrine  5 mg Per Tube TID WC  . pantoprazole sodium  40 mg Per Tube Q1200  . QUEtiapine  25 mg Per Tube QHS  . sodium chloride flush  10-40 mL Intracatheter Q12H  . sodium chloride flush  3 mL Intravenous Q12H    Infusions: . sodium chloride Stopped (05/04/19 0200)  . sodium chloride    . anticoagulant sodium citrate    . bivalirudin (ANGIOMAX) infusion 0.5 mg/mL (Non-ACS indications) 0.0802 mg/kg/hr (05/12/19 1500)  . calcium gluconate  infusion for CRRT 20 g (05/12/19 1017)  . feeding supplement (VITAL AF 1.2 CAL) 60 mL/hr at 05/12/19 0700  . milrinone 0.125 mcg/kg/min (05/12/19 1500)  . norepinephrine (LEVOPHED) Adult infusion 12 mcg/min (05/12/19 1500)  . prismasol [id number] 4/0 300 mL/hr at 05/12/19 1016  . prismasol B22GK 4/0 1,800 mL/hr at 05/12/19 1435  . sodium citrate 2 %/dextrose 2.5% solution 3000 mL 250 mL/hr at 05/12/19 1016    PRN Medications: sodium chloride, sodium chloride, anticoagulant sodium citrate, bisacodyl, docusate, fentaNYL, fentaNYL (SUBLIMAZE) injection, labetalol **AND** [DISCONTINUED] niCARDipine, midazolam, sodium chloride flush, sodium chloride flush   Assessment/Plan:   1. Acute on chronic systolic HF due to NICM-> cardiogenic shock - Echo EF < 20% - now on milrinone 0.125 mgc. Co-ox 93% - unable to wean off NE, remains on high dose 12 mcg/min w/ low CVP and rising WBC, 11>>21 but AF. ? Component of septic shock. Pan cultures pending. C-diff negative. Consider repeat CXR, PCT. ? Adding VP and broad spectrum abx  - Continue midodrine to facilitate pressor wean, 5 mg tid - Volume controlled through CRRT 2. AKI -> ESRD - started on CRRT 04/23/19 - remains anuric  - SCr trending back up,  3.47>>3.20>>2.40>>3.41>>3.50>>4.89>>6.20 - management per nephrology  3. Acute CVA with severe residual deficits due to extensive R cerebellar, midbrain, R MCA and L MCA infarcts  - likely cardioembolic - s/p clot extraction - mental status improving slowly but still quite compromised 4. Acute hypoxic respiratory failure - s/p trach  - now tolerating for > 24 hours 5. Wide complex tachycardia - tele reviewed extensively and discussed with EP - On 2/20 developed several hours of atrial tach. Since that time had had sinus tach with progressively increasing HRs, up into the 120s.  He recieved 1 x dose of adenosine 32m IV with slowing of sinus tach and then sped back up. Started on ivabradine. HR improved into  the 70s. - Do not see any evidence of VT currently.  - Continue ivabradine 5 bid.  - Continue amio 200 mg bid for now given atrial tach - Keep K >4.0, Mg >2.0   Length of Stay:  [place]  PA-C 05/12/2019, 4:03 PM  Advanced Heart Failure Team Pager [phone] (M-F; 7a - 4p)  Please contact [facility] for night-coverage after hours (4p -7a ) and weekends on [email or web address]  Agree with above. Given IVF yesterday due to low CVP and ongoing hypotension requiring NE. NE requirements now going up and WBC climbing. Awake on trach collar. Will track and follow some commands  General:  lying in bed on TC will track and follow simple commande HEENT: normal Neck: supple. no JVD. Carotids 2+ bilat; no bruits. No lymphadenopathy or thryomegaly appreciated. Cor: PMI nondisplaced. Regular rate & rhythm. +s3 Lungs: clear Abdomen: soft, nontender, nondistended. No hepatosplenomegaly. No bruits or masses. Good bowel sounds. Extremities: no cyanosis, clubbing, rash, edema RFV trialysis Neuro:  lying in bed on TC will track and follow simple commande  Making little to no progress from hemodynamic standpoint. With low CVP and rising WBC concern for impending sepsis. CCM managing.  Would give IVF as needed to get CVP at least > 5 and culture. Remove all non-critical lines.   CRITICAL CARE Performed by: Glori Bickers  Total critical care time: 35 minutes  Critical care time was exclusive of separately billable procedures and treating other patients.  Critical care was necessary to treat or prevent imminent or life-threatening deterioration.  Critical care was time spent personally by me (independent of midlevel providers or residents) on the following activities: development of treatment plan with patient and/or surrogate as well as nursing, discussions with consultants, evaluation of patient's response to treatment, examination of patient, obtaining history from patient or surrogate,  ordering and performing treatments and interventions, ordering and review of laboratory studies, ordering and review of radiographic studies, pulse oximetry and re-evaluation of patient's condition.  Glori Bickers, MD  7:57 PM

## 2019-05-12 NOTE — Progress Notes (Signed)
Nephrology Progress Note:  Patient ID: Carlos Brewer., male   DOB: 19-Apr-1985, 34 y.o.   MRN: [id number]   S:  On milrinone.  On levo at 20.  CRRT clotted around 6pm on 2/24 per nursing and was not restarted (per our order).  Clotted two other times during the day.  Had 1.4 liters UF on 2/24 with CRRT.  Has been anuric.  Had 650 mL stool overnight per nursing report as well as 250 during day.   Review of systems: unable to obtain 2/2 pt AMS/nonverbal   O:BP (!) 102/59   Pulse 100   Temp 99.5 F (37.5 C) (Axillary)   Resp (!) 31   Ht 6' 1"  (1.854 m)   Wt 105.7 kg   SpO2 92%   BMI 30.74 kg/m   Intake/Output Summary (Last 24 hours) at 05/12/2019 0645 Last data filed at 05/12/2019 0500 Gross per 24 hour  Intake 3255.98 ml  Output 1855 ml  Net 1400.98 ml   Intake/Output: I/O last 3 completed shifts: In: 5903.7 [I.V.:2718.9; NG/GT:2110.3; IV Piggyback:1074.5] Out: 279 587 3576 [KVQQV:9563; OVFIE:3329]    Intake/Output this shift:  Total I/O In: 1163.9 [I.V.:327.4; NG/GT:660; IV Piggyback:176.5] Out: -  Weight change: 0.3 kg  Physical exam:    Gen: awake in bed, trach collar, nonverbal and not following commands but is tracking HEENT - trach in place CVS: tachy at 98; S1S2 no rub Resp: mechanical breath sounds  Abd: soft/ND Ext: he has no edema Access: right femoral nontunneled catheter  Recent Labs  Lab 05/09/19 0500 05/09/19 0631 05/09/19 1655 05/09/19 1657 05/09/19 2215 05/09/19 2252 05/10/19 0504 05/10/19 0757 05/10/19 1557 05/10/19 1559 05/10/19 2155 05/11/19 0210 05/11/19 0214 05/11/19 0445 05/11/19 0825 05/11/19 1645 05/12/19 0207  NA 137   < > 137   < > 137   < > 140   < > 139   < > 140 140 139 140 138 138 138  K 3.9   < > 3.7   < > 3.6   < > 3.5   < > 3.8   < > 3.7 3.6 3.7 3.9 3.9 3.9 4.2  CL 92*   < > 92*   < > 94*   < > 92*   < > 91*   < > 90* 91* 92* 88* 92* 98 98  CO2 26   < > 26  --  25  --  27  --  28  --   --   --   --  30  --  24 22   GLUCOSE 286*   < > 302*   < > 321*   < > 195*   < > 263*   < > 314* 286* 241* 226* 281* 223* 226*  BUN 39*   < > 36*   < > 42*   < > 37*   < > 38*   < > 35* 36* 45* 44* 40* 43* 71*  CREATININE 3.32*   < > 3.14*   < > 3.28*   < > 3.47*   < > 3.31*   < > 2.80* 2.60* 3.40* 3.41* 3.50* 3.29* 4.89*  ALBUMIN 2.6*  --  2.7*  --   --   --  2.9*  --  2.9*  --   --   --   --  3.1*  --  2.9* 2.9*  CALCIUM 9.7   < > 10.1  --  9.4  --  10.0  --  10.6*  --   --   --   --  11.0*  --  9.9 10.0  PHOS 3.9  --  3.3  --   --   --  4.1  --  4.4  --   --   --   --  5.7*  --  5.7* 6.2*  AST 44*  --   --   --   --   --   --   --   --   --   --   --   --   --   --   --   --   ALT 78*  --   --   --   --   --   --   --   --   --   --   --   --   --   --   --   --    < > = values in this interval not displayed.   Liver Function Tests: Recent Labs  Lab 05/09/19 0500 05/09/19 1655 05/11/19 0445 05/11/19 1645 05/12/19 0207  AST 44*  --   --   --   --   ALT 78*  --   --   --   --   ALKPHOS 164*  --   --   --   --   BILITOT 1.4*  --   --   --   --   PROT 8.2*  --   --   --   --   ALBUMIN 2.6*   < > 3.1* 2.9* 2.9*   < > = values in this interval not displayed.   CBC: Recent Labs  Lab 05/06/19 0500 05/06/19 1558 05/07/19 0246 05/07/19 0343 05/08/19 0307 05/08/19 0816 05/09/19 0500 05/09/19 0631 05/11/19 0214 05/11/19 0825 05/12/19 0206  WBC 15.6*  --  14.0*   < > 18.3*  --  11.1*  --   --   --  21.4*  HGB 9.7*   < > 8.4*   < > 9.3*   < > 9.9*   < > 13.6 14.3 11.8*  HCT 31.1*   < > 26.4*   < > 29.2*   < > 32.3*   < > 40.0 42.0 38.0*  MCV 97.2  --  95.3  --  97.0  --  98.5  --   --   --  99.2  PLT 165  --  194   < > 362  --  419*  --   --   --  625*   < > = values in this interval not displayed.   Cardiac Enzymes: No results for input(s): CKTOTAL, CKMB, CKMBINDEX, TROPONINI in the last 168 hours. CBG: Recent Labs  Lab 05/11/19 1159 05/11/19 1612 05/11/19 1942 05/11/19 2321 05/12/19 0328  GLUCAP  235* 188* 226* 225* 185*    Studies/Results: No results found. . sodium chloride   Intravenous Once  . amiodarone  200 mg Per Tube BID  . aspirin  81 mg Per Tube Daily  . atorvastatin  40 mg Per Tube q1800  . chlorhexidine gluconate (MEDLINE KIT)  15 mL Mouth Rinse BID  . Chlorhexidine Gluconate Cloth  6 each Topical Q0600  . feeding supplement (PRO-STAT SUGAR FREE 64)  60 mL Per Tube TID  . FLUoxetine  20 mg Per Tube Daily  . insulin aspart  0-20 Units Subcutaneous Q4H  . insulin aspart  10 Units Subcutaneous Q4H  . insulin detemir  30 Units Subcutaneous BID  . ivabradine  5 mg Per  Tube BID WC  . mouth rinse  15 mL Mouth Rinse 10 times per day  . midodrine  5 mg Per Tube TID WC  . pantoprazole sodium  40 mg Per Tube Q1200  . QUEtiapine  25 mg Per Tube QHS  . sodium chloride flush  10-40 mL Intracatheter Q12H  . sodium chloride flush  3 mL Intravenous Q12H    BMET    Component Value Date/Time   NA 138 05/12/2019 0207   K 4.2 05/12/2019 0207   CL 98 05/12/2019 0207   CO2 22 05/12/2019 0207   GLUCOSE 226 (H) 05/12/2019 0207   BUN 71 (H) 05/12/2019 0207   CREATININE 4.89 (H) 05/12/2019 0207   CALCIUM 10.0 05/12/2019 0207   GFRNONAA 14 (L) 05/12/2019 0207   GFRAA 17 (L) 05/12/2019 0207   CBC    Component Value Date/Time   WBC 21.4 (H) 05/12/2019 0206   RBC 3.83 (L) 05/12/2019 0206   HGB 11.8 (L) 05/12/2019 0206   HCT 38.0 (L) 05/12/2019 0206   PLT 625 (H) 05/12/2019 0206   MCV 99.2 05/12/2019 0206   MCH 30.8 05/12/2019 0206   MCHC 31.1 05/12/2019 0206   RDW 14.6 05/12/2019 0206   LYMPHSABS 0.8 04/24/2019 0843   MONOABS 0.5 04/24/2019 0843   EOSABS 0.4 04/24/2019 0843   BASOSABS 0.0 04/24/2019 0843   Assessment/Plan:  1. AKIpresumably due to ATN with ischemic/nephrotoxic insults worsened by cardiogenic shock. Started on CRRT 04/23/19.  1. Resumed CVVHD 05/06/19 due to hyperkalemia which has resolved. 2. Resume CRRT  3. Using 4K/0Ca baths for all fluids due to  use of citrate.systemic and post-filter calcium being monitored per protocol 2. Cardiogenic shock-  Hx milrinone due to development of a fib with rvr and wide complex tachycardia.   1. Discussed with Dr. Haroldine Laws with advanced CHF who felt that he has a known severe NICM with EF <20% and did not feel that he would tolerate IHD but would continue to support with milrinone and midodrine/pressors as needed to help promote renal perfusion.  3. Acute stroke- presumed embolic with bilateral infarcts and LV thrombus- received tpa on admission 4. SVT/polymorphic VT- per Cardiology 5. Acute hypoxic respiratory failure- s/p trach  6. 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 HIT Ab+.Platelets improving. 7. Disposition- overall prognosis is poor with ongoing RRT (felt that he would not tolerate stopping CVVHD due to hypercatabolic state), trach, as well as cardiogenic shock/cardiomyopathy. palliative care consulted    Claudia Desanctis, MD 05/12/2019 7:07 AM

## 2019-05-13 DIAGNOSIS — R579 Shock, unspecified: Secondary | ICD-10-CM | POA: Diagnosis not present

## 2019-05-13 DIAGNOSIS — R570 Cardiogenic shock: Secondary | ICD-10-CM | POA: Diagnosis not present

## 2019-05-13 DIAGNOSIS — I639 Cerebral infarction, unspecified: Secondary | ICD-10-CM | POA: Diagnosis not present

## 2019-05-13 DIAGNOSIS — J9601 Acute respiratory failure with hypoxia: Secondary | ICD-10-CM | POA: Diagnosis not present

## 2019-05-13 DIAGNOSIS — N179 Acute kidney failure, unspecified: Secondary | ICD-10-CM | POA: Diagnosis not present

## 2019-05-13 DIAGNOSIS — N170 Acute kidney failure with tubular necrosis: Secondary | ICD-10-CM | POA: Diagnosis not present

## 2019-05-13 LAB — POCT I-STAT, CHEM 8
BUN: 47 mg/dL — ABNORMAL HIGH (ref 6–20)
BUN: 63 mg/dL — ABNORMAL HIGH (ref 6–20)
BUN: 43 mg/dL — ABNORMAL HIGH (ref 6–20)
BUN: 55 mg/dL — ABNORMAL HIGH (ref 6–20)
BUN: 63 mg/dL — ABNORMAL HIGH (ref 6–20)
BUN: 35 mg/dL — ABNORMAL HIGH (ref 6–20)
BUN: 48 mg/dL — ABNORMAL HIGH (ref 6–20)
BUN: 34 mg/dL — ABNORMAL HIGH (ref 6–20)
BUN: 43 mg/dL — ABNORMAL HIGH (ref 6–20)
Calcium, Ion: 0.4 mmol/L — CL (ref 1.15–1.40)
Calcium, Ion: 1.09 mmol/L — ABNORMAL LOW (ref 1.15–1.40)
Calcium, Ion: 0.41 mmol/L — CL (ref 1.15–1.40)
Calcium, Ion: 1.05 mmol/L — ABNORMAL LOW (ref 1.15–1.40)
Calcium, Ion: 1.24 mmol/L (ref 1.15–1.40)
Calcium, Ion: 0.48 mmol/L — CL (ref 1.15–1.40)
Calcium, Ion: 1.05 mmol/L — ABNORMAL LOW (ref 1.15–1.40)
Calcium, Ion: 0.45 mmol/L — CL (ref 1.15–1.40)
Calcium, Ion: 1.04 mmol/L — ABNORMAL LOW (ref 1.15–1.40)
Chloride: 100 mmol/L (ref 98–111)
Chloride: 101 mmol/L (ref 98–111)
Chloride: 101 mmol/L (ref 98–111)
Chloride: 99 mmol/L (ref 98–111)
Chloride: 105 mmol/L (ref 98–111)
Chloride: 102 mmol/L (ref 98–111)
Chloride: 103 mmol/L (ref 98–111)
Chloride: 102 mmol/L (ref 98–111)
Chloride: 103 mmol/L (ref 98–111)
Creatinine, Ser: 3 mg/dL — ABNORMAL HIGH (ref 0.61–1.24)
Creatinine, Ser: 4.6 mg/dL — ABNORMAL HIGH (ref 0.61–1.24)
Creatinine, Ser: 2.8 mg/dL — ABNORMAL HIGH (ref 0.61–1.24)
Creatinine, Ser: 4.3 mg/dL — ABNORMAL HIGH (ref 0.61–1.24)
Creatinine, Ser: 5.1 mg/dL — ABNORMAL HIGH (ref 0.61–1.24)
Creatinine, Ser: 2.4 mg/dL — ABNORMAL HIGH (ref 0.61–1.24)
Creatinine, Ser: 3.9 mg/dL — ABNORMAL HIGH (ref 0.61–1.24)
Creatinine, Ser: 2.4 mg/dL — ABNORMAL HIGH (ref 0.61–1.24)
Creatinine, Ser: 3.6 mg/dL — ABNORMAL HIGH (ref 0.61–1.24)
Glucose, Bld: 189 mg/dL — ABNORMAL HIGH (ref 70–99)
Glucose, Bld: 219 mg/dL — ABNORMAL HIGH (ref 70–99)
Glucose, Bld: 239 mg/dL — ABNORMAL HIGH (ref 70–99)
Glucose, Bld: 264 mg/dL — ABNORMAL HIGH (ref 70–99)
Glucose, Bld: 161 mg/dL — ABNORMAL HIGH (ref 70–99)
Glucose, Bld: 148 mg/dL — ABNORMAL HIGH (ref 70–99)
Glucose, Bld: 185 mg/dL — ABNORMAL HIGH (ref 70–99)
Glucose, Bld: 133 mg/dL — ABNORMAL HIGH (ref 70–99)
Glucose, Bld: 93 mg/dL (ref 70–99)
HCT: 37 % — ABNORMAL LOW (ref 39.0–52.0)
HCT: 40 % (ref 39.0–52.0)
HCT: 37 % — ABNORMAL LOW (ref 39.0–52.0)
HCT: 41 % (ref 39.0–52.0)
HCT: 35 % — ABNORMAL LOW (ref 39.0–52.0)
HCT: 32 % — ABNORMAL LOW (ref 39.0–52.0)
HCT: 36 % — ABNORMAL LOW (ref 39.0–52.0)
HCT: 35 % — ABNORMAL LOW (ref 39.0–52.0)
HCT: 31 % — ABNORMAL LOW (ref 39.0–52.0)
Hemoglobin: 12.6 g/dL — ABNORMAL LOW (ref 13.0–17.0)
Hemoglobin: 13.6 g/dL (ref 13.0–17.0)
Hemoglobin: 12.6 g/dL — ABNORMAL LOW (ref 13.0–17.0)
Hemoglobin: 13.9 g/dL (ref 13.0–17.0)
Hemoglobin: 11.9 g/dL — ABNORMAL LOW (ref 13.0–17.0)
Hemoglobin: 10.9 g/dL — ABNORMAL LOW (ref 13.0–17.0)
Hemoglobin: 12.2 g/dL — ABNORMAL LOW (ref 13.0–17.0)
Hemoglobin: 11.9 g/dL — ABNORMAL LOW (ref 13.0–17.0)
Hemoglobin: 10.5 g/dL — ABNORMAL LOW (ref 13.0–17.0)
Potassium: 3.7 mmol/L (ref 3.5–5.1)
Potassium: 3.7 mmol/L (ref 3.5–5.1)
Potassium: 3.8 mmol/L (ref 3.5–5.1)
Potassium: 3.8 mmol/L (ref 3.5–5.1)
Potassium: 4.5 mmol/L (ref 3.5–5.1)
Potassium: 3.9 mmol/L (ref 3.5–5.1)
Potassium: 4 mmol/L (ref 3.5–5.1)
Potassium: 3.9 mmol/L (ref 3.5–5.1)
Potassium: 3.8 mmol/L (ref 3.5–5.1)
Sodium: 140 mmol/L (ref 135–145)
Sodium: 142 mmol/L (ref 135–145)
Sodium: 139 mmol/L (ref 135–145)
Sodium: 141 mmol/L (ref 135–145)
Sodium: 140 mmol/L (ref 135–145)
Sodium: 140 mmol/L (ref 135–145)
Sodium: 141 mmol/L (ref 135–145)
Sodium: 142 mmol/L (ref 135–145)
Sodium: 141 mmol/L (ref 135–145)
TCO2: 24 mmol/L (ref 22–32)
TCO2: 26 mmol/L (ref 22–32)
TCO2: 26 mmol/L (ref 22–32)
TCO2: 27 mmol/L (ref 22–32)
TCO2: 27 mmol/L (ref 22–32)
TCO2: 25 mmol/L (ref 22–32)
TCO2: 26 mmol/L (ref 22–32)
TCO2: 24 mmol/L (ref 22–32)
TCO2: 27 mmol/L (ref 22–32)

## 2019-05-13 LAB — RENAL FUNCTION PANEL
Albumin: 2.8 g/dL — ABNORMAL LOW (ref 3.5–5.0)
Albumin: 2.6 g/dL — ABNORMAL LOW (ref 3.5–5.0)
Anion gap: 19 — ABNORMAL HIGH (ref 5–15)
Anion gap: 15 (ref 5–15)
BUN: 65 mg/dL — ABNORMAL HIGH (ref 6–20)
BUN: 61 mg/dL — ABNORMAL HIGH (ref 6–20)
CO2: 23 mmol/L (ref 22–32)
CO2: 22 mmol/L (ref 22–32)
Calcium: 9.6 mg/dL (ref 8.9–10.3)
Calcium: 9 mg/dL (ref 8.9–10.3)
Chloride: 95 mmol/L — ABNORMAL LOW (ref 98–111)
Chloride: 103 mmol/L (ref 98–111)
Creatinine, Ser: 4.32 mg/dL — ABNORMAL HIGH (ref 0.61–1.24)
Creatinine, Ser: 3.98 mg/dL — ABNORMAL HIGH (ref 0.61–1.24)
GFR calc Af Amer: 19 mL/min — ABNORMAL LOW (ref >60)
GFR calc Af Amer: 21 mL/min — ABNORMAL LOW (ref >60)
GFR calc non Af Amer: 17 mL/min — ABNORMAL LOW (ref >60)
GFR calc non Af Amer: 18 mL/min — ABNORMAL LOW (ref >60)
Glucose, Bld: 248 mg/dL — ABNORMAL HIGH (ref 70–99)
Glucose, Bld: 223 mg/dL — ABNORMAL HIGH (ref 70–99)
Phosphorus: 5.8 mg/dL — ABNORMAL HIGH (ref 2.5–4.6)
Phosphorus: 5.2 mg/dL — ABNORMAL HIGH (ref 2.5–4.6)
Potassium: 3.7 mmol/L (ref 3.5–5.1)
Potassium: 3.9 mmol/L (ref 3.5–5.1)
Sodium: 137 mmol/L (ref 135–145)
Sodium: 140 mmol/L (ref 135–145)

## 2019-05-13 LAB — SEROTONIN RELEASE ASSAY (SRA)
SRA .2 IU/mL UFH Ser-aCnc: 90 % — ABNORMAL HIGH (ref 0–20)
SRA 100IU/mL UFH Ser-aCnc: 2 % (ref 0–20)

## 2019-05-13 LAB — CBC
HCT: 32.6 % — ABNORMAL LOW (ref 39.0–52.0)
Hemoglobin: 10 g/dL — ABNORMAL LOW (ref 13.0–17.0)
MCH: 31 pg (ref 26.0–34.0)
MCHC: 30.7 g/dL (ref 30.0–36.0)
MCV: 100.9 fL — ABNORMAL HIGH (ref 80.0–100.0)
Platelets: 431 10*3/uL — ABNORMAL HIGH (ref 150–400)
RBC: 3.23 MIL/uL — ABNORMAL LOW (ref 4.22–5.81)
RDW: 14.7 % (ref 11.5–15.5)
WBC: 14.4 10*3/uL — ABNORMAL HIGH (ref 4.0–10.5)
nRBC: 0 % (ref 0.0–0.2)

## 2019-05-13 LAB — BASIC METABOLIC PANEL
Anion gap: 14 (ref 5–15)
BUN: 48 mg/dL — ABNORMAL HIGH (ref 6–20)
CO2: 23 mmol/L (ref 22–32)
Calcium: 8.7 mg/dL — ABNORMAL LOW (ref 8.9–10.3)
Chloride: 103 mmol/L (ref 98–111)
Creatinine, Ser: 3.58 mg/dL — ABNORMAL HIGH (ref 0.61–1.24)
GFR calc Af Amer: 24 mL/min — ABNORMAL LOW (ref >60)
GFR calc non Af Amer: 21 mL/min — ABNORMAL LOW (ref >60)
Glucose, Bld: 117 mg/dL — ABNORMAL HIGH (ref 70–99)
Potassium: 3.6 mmol/L (ref 3.5–5.1)
Sodium: 140 mmol/L (ref 135–145)

## 2019-05-13 LAB — GLUCOSE, CAPILLARY
Glucose-Capillary: 134 mg/dL — ABNORMAL HIGH (ref 70–99)
Glucose-Capillary: 172 mg/dL — ABNORMAL HIGH (ref 70–99)
Glucose-Capillary: 178 mg/dL — ABNORMAL HIGH (ref 70–99)
Glucose-Capillary: 191 mg/dL — ABNORMAL HIGH (ref 70–99)
Glucose-Capillary: 195 mg/dL — ABNORMAL HIGH (ref 70–99)
Glucose-Capillary: 200 mg/dL — ABNORMAL HIGH (ref 70–99)

## 2019-05-13 LAB — COOXEMETRY PANEL
Carboxyhemoglobin: 1.8 % — ABNORMAL HIGH (ref 0.5–1.5)
Methemoglobin: 0.6 % (ref 0.0–1.5)
O2 Saturation: 99.7 %
Total hemoglobin: 11.5 g/dL — ABNORMAL LOW (ref 12.0–16.0)

## 2019-05-13 LAB — APTT
aPTT: 45 seconds — ABNORMAL HIGH (ref 24–36)
aPTT: 50 seconds — ABNORMAL HIGH (ref 24–36)
aPTT: 49 seconds — ABNORMAL HIGH (ref 24–36)
aPTT: 46 seconds — ABNORMAL HIGH (ref 24–36)
aPTT: 47 seconds — ABNORMAL HIGH (ref 24–36)

## 2019-05-13 LAB — MAGNESIUM: Magnesium: 2.3 mg/dL (ref 1.7–2.4)

## 2019-05-13 MED ORDER — MIDODRINE HCL 5 MG PO TABS
10.0000 mg | ORAL_TABLET | Freq: Three times a day (TID) | ORAL | Status: DC
  Administered 2019-05-13 – 2019-05-15 (×7): 10 mg
  Filled 2019-05-13 (×7): qty 2

## 2019-05-13 MED ORDER — POTASSIUM CHLORIDE 20 MEQ/15ML (10%) PO SOLN
20.0000 meq | Freq: Once | ORAL | Status: AC
  Administered 2019-05-13: 20 meq
  Filled 2019-05-13: qty 15

## 2019-05-13 MED ORDER — FENTANYL CITRATE (PF) 100 MCG/2ML IJ SOLN
25.0000 ug | INTRAMUSCULAR | Status: DC | PRN
  Administered 2019-05-15 – 2019-05-28 (×18): 50 ug via INTRAVENOUS
  Filled 2019-05-13 (×16): qty 2

## 2019-05-13 MED ORDER — ALTEPLASE 2 MG IJ SOLR
2.0000 mg | Freq: Once | INTRAMUSCULAR | Status: AC
  Administered 2019-05-13: 08:00:00 2 mg

## 2019-05-13 MED ORDER — SODIUM CHLORIDE 0.9 % IV BOLUS
500.0000 mL | Freq: Once | INTRAVENOUS | Status: AC
  Administered 2019-05-13: 500 mL via INTRAVENOUS

## 2019-05-13 MED ORDER — ALTEPLASE 2 MG IJ SOLR
2.0000 mg | Freq: Once | INTRAMUSCULAR | Status: AC
  Administered 2019-05-13: 2 mg

## 2019-05-13 MED ORDER — SODIUM CHLORIDE 0.9 % IV BOLUS
500.0000 mL | Freq: Once | INTRAVENOUS | Status: AC
  Administered 2019-05-13: 20:00:00 500 mL via INTRAVENOUS

## 2019-05-13 MED ORDER — POTASSIUM CHLORIDE 20 MEQ/15ML (10%) PO SOLN
20.0000 meq | Freq: Once | ORAL | Status: AC
  Administered 2019-05-13: 10:00:00 20 meq

## 2019-05-13 MED ORDER — SODIUM CHLORIDE 0.9 % IV BOLUS
1000.0000 mL | Freq: Once | INTRAVENOUS | Status: AC
  Administered 2019-05-13: 13:00:00 1000 mL via INTRAVENOUS

## 2019-05-13 MED ORDER — SODIUM CHLORIDE 0.9 % IV SOLN
0.0870 mg/kg/h | INTRAVENOUS | Status: DC
  Administered 2019-05-13: 0.087 mg/kg/h via INTRAVENOUS
  Filled 2019-05-13: qty 250

## 2019-05-13 MED ORDER — INSULIN DETEMIR 100 UNIT/ML ~~LOC~~ SOLN
35.0000 [IU] | Freq: Two times a day (BID) | SUBCUTANEOUS | Status: DC
  Administered 2019-05-13 – 2019-05-17 (×8): 35 [IU] via SUBCUTANEOUS
  Filled 2019-05-13 (×11): qty 0.35

## 2019-05-13 MED ORDER — SODIUM CHLORIDE 0.9 % IV SOLN
0.1500 mg/kg/h | INTRAVENOUS | Status: DC
  Administered 2019-05-13: 0.087 mg/kg/h via INTRAVENOUS
  Administered 2019-05-14 – 2019-05-17 (×6): 0.175 mg/kg/h via INTRAVENOUS
  Filled 2019-05-13 (×7): qty 250

## 2019-05-13 NOTE — Progress Notes (Signed)
ANTICOAGULATION CONSULT NOTE  Pharmacy Consult:  Bivalirudin Indication: stroke 2/5, LV thrombus 2/8, HIT 2/15   Allergies  Allergen Reactions  . Heparin Other (See Comments)    Heparin induced thrombocytopenia. 2/15 HIT OD 1.692.     Patient Measurements: Height: 6\' 1"  (185.4 cm) Weight: 230 lb 2.6 oz (104.4 kg) IBW/kg (Calculated) : 79.9    Vital Signs: Temp: 97.7 F (36.5 C) (02/26 0400) Temp Source: Oral (02/26 0400) BP: 86/43 (02/26 0715) Pulse Rate: 72 (02/26 0715)  Labs: Recent Labs    05/11/19 0445 05/11/19 0825 05/12/19 0206 05/12/19 0207 05/13/19 [id number] 05/13/19 [id number] 05/13/19 0503 05/13/19 0623 05/13/19 0626  HGB  --    < > 11.8*   < > 12.6*   < >  --  13.9 12.6*  HCT  --    < > 38.0*   < > 37.0*  --   --  41.0 37.0*  PLT  --   --  625*  --   --   --   --   --   --   APTT 47*  --  46*  --   --   --  45*  --   --   CREATININE 3.41*   < >  --    < > 4.60*   < > 4.32* 2.80* 4.30*   < > = values in this interval not displayed.    Estimated Creatinine Clearance: 30.7 mL/min (A) (by C-G formula based on SCr of 4.3 mg/dL (H)).  Assessment: 34 yr old male with left MCA infarct - received TPA and revascularization with IR on 2/5. On 2/8 found to have small LV apical thrombus on ECHO. Pharmacy consulted to dose IV heparin, which was switched to bivalirudin 2/15 for HIT.    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 SRA still pending (drawn 2/16).    APTT remains slightly subtherapeutic after increasing bivalrudin - currently using Adjusted body weight but will increase to Actual body weight based on protocol; no issue with infusion.  No bleeding reported. Hemoglobin is low but stable. Platelets are elevated on last check at 625.  Goal of Therapy:  APTT 50- 65 sec  Monitor platelets by anticoagulation protocol: Yes   Plan:  Increase bivalrudin to 0.087 mg/kg/hr and change to actual body weight-- this will be a 20% increase based  on protocol recommendations. Recheck aPTT in 2 hours.  Daily aPTT and CBC F/u SRA from 2/16  Sloan Leiter, PharmD, BCPS, BCCCP Clinical Pharmacist Please refer to [facility] for [place] numbers 05/13/2019, 7:28 AM

## 2019-05-13 NOTE — Progress Notes (Signed)
NAME:  Carlos Mcentee., MRN:  [id number], DOB:  1985-07-20, LOS: 21 ADMISSION DATE:  05/02/2019, CONSULTATION DATE:  05/15/2019 REFERRING MD:  Dr. Leonel Ramsay, CHIEF COMPLAINT:  Slurred speech  Brief History   34 yo male smoker found to have slurred speech and Rt sided weakness.  Admitted with left MCA CVA and multiple smaller embolic infarcts s/p tPA and thrombectomy in IR.  Additionally found to have left ventricular atrial thrombus.    Course complicated by septic shock r/t HCAP, AKI requiring CRRT, polymorphic VT and cardiogenic shock.   Past Medical History  Systolic CHF with non ischemic CM, Cocaine abuse, OSA, DM Hx of CHF (EF 15%)  Hx myocarditis Smoker 1/2 ppd   Significant Hospital Events   2/05 Admit, tPA, IR thrombectomy 2/6 100% on fvent at 2300  Overnight requiring increasing levophed and phenylephrine.    2/6: switched to levophed, epi, started antibiotics, started on CRRT.  2/9  developed wide-complex tachycardia and hypotension, started on amiodarone drip, increase Levophed drip 2/15 drop in platelets, heparin stopped, bival started 2/17 Hypotensive and back on Levophed 2/18 trach, lines changed 2/19 started milrinone , fever 103 2/20 Episode of wide-complex tachycardia yesterday, changed from Levophed to vasopressin 2/22 stopping vanc. Still on inotrope support. Some AF w/ RVR. Had to be placed back on pressors. ivabradine added 2/23 still on pressors/ CRRT continued 2/24 tmax 98.4, Remains on CRRT, even UF, remains anuric, Levophed at 14 mcg/min, Milrinone at 0.125 mcg/kg/min, Coox 63.4 Doing well this morning on ATC, no events overnight.  Midodrine added   2/25 more interactive, remains on ATC, tmax 99.5/ WBC 21.4, ~900 ml liquid stool/ 24 hours, neg Cdiff, Levophed up to 20 mcg/min, CVP 2, ,milrinone remains at 0.125 mcg/kg/min, coox 92.7, Off CRRT since last night ~2000 s/p clotted x 3 off citrate, restarted   Consults:  Neuro IR Cardiology   Nephrology Heart Failure  EP   Procedures:  2/5-2/6:  TPA given at 428 am (total of 90 Mg) 520 am went to IR  S/P Lt common carotid arteriogram followed by complete revascularization of occluded LT MCA sup division mid M2 seg with x 1 pass with 18mx 40 mm solitaire X ret river device and penumbra aspiration with TICI 3 revascularization  ETT 2/05 >> 2/18 [facility] CVL 2/5 >>2/18  RT [place] CVL 2/18 >> RIJ HD cath 2/6 >>2/18 6 shiley cuffed trach 2/18  >>  RT fem HD 2/19 >>  Significant Diagnostic Tests:  CT angio head/neck 2/05 >> occlusion of Lt MCA bifurcation Echo 2/05 >> EF less than 20%, cannot rule out apical thrombus MRI brain 2/11 > extensive acute infarction of multiple areas without large or medium vessel occlusion Echocardiogram 2/8 Left ventricular ejection fraction, by estimation, is <20%. The left  ventricle has severely decreased function. The left ventricle demonstrates  global hypokinesis. The left ventricular internal cavity size was severely  dilated. Possible small 0.8 x  0.6 cm apical thrombus. Somewhat subtle finding, could confirm with  cardiac MRI. However, if patient has had CVA, would be reasonable to  anticoagulate given severity of LV dysfunction if no contraindications to  anticoagulation.   Micro Data:  SARS CoV2 PCR 2/05 >> negative Influenza PCR 2/05 >> negative Urine 2/6 >> ng resp 2/6 >> nml flora BC 2/6 >>ng resp 2/16 >> [facility] >> negative  BC x 2 2/25 >> Cdiff 2/25 >> neg  Antimicrobials:  mero 2/6 -2/12 Zosyn 2/6 , 2/19 >> 2/23 vanc 2/6 >>  2/9 , 2/19 >>2/22  Interim history/subjective:  tmax 97.8 CVP 5 Levophed at 10-12 mcg/min coox 99.7 Frequent runs of bigeminy this morning  Objective   Blood pressure (!) 93/55, pulse 75, temperature 97.8 F (36.6 C), temperature source Oral, resp. rate (!) 21, height 6' 1"  (1.854 m), weight 104.4 kg, SpO2 98 %. CVP:  [4 mmHg-41 mmHg] 6 mmHg  FiO2 (%):  [28 %] 28 %   Intake/Output  Summary (Last 24 hours) at 05/13/2019 6789 Last data filed at 05/13/2019 0700 Gross per 24 hour  Intake 3645.84 ml  Output 3008 ml  Net 637.84 ml   Filed Weights   05/11/19 0500 05/12/19 0500 05/13/19 0500  Weight: 105.4 kg 105.7 kg 104.4 kg    Examination: General:  Younger adult AAM, in no distress, not has active currently, more interactive earlier this morning HEENT: MM pink/moist, midline trach with minimal tan secretions, pupils 5/reactive Neuro: difficult to arouse, will briefly stay awake, f/c minimal commands CV: rr ir PULM:  Non labored, strong cough, CTA, diminished in bases GI: soft, NT/ ND, +bs, flexiseal Extremities: warm/dry, trace LE edema  Skin: no rashes    Resolved Hospital Problem list     Assessment & Plan:   Left MCA CVA Multiple other infarcts noted on MRI 3/81-OFBPZW embolic Acute metabolic encephalopathy Plan Supportive care PT/OT efforts when able qhs Seroquel   Acute systolic HF with cardiogenic shock in setting of NICM, ejection fraction is less than 20% Plan Per HF team  Daily coox- today 99.7- difficult to interpret  Milrinone per HF, currently at 0.125 mcg/kg/min Increase midodrine to TID to help wean levophed for MAP goal >65 Trend CVP, ideally goal > 4-6 while on pressors S/p fluid bolus by Nephrology this am  Cortisol 27.4 Goal K> 4, Mag >2 KCL 40 meq now per tube given bigeminy   Left ventricle apical thrombus non ischemic cardiomyopathy with systolic congestive heart failure Polymorphic VT on 2/9, WCT 2/19 HLD Plan Tele monitoring  Per Cards  Remains on PO amiodarone, and ivabradine Daily ASA, lipitor  Leukocytosis - treated for HCAP 2/19- 2/23 zosyn/ vanc Plan Remains afebrile.  No obvious source of infection at this point but high risk, monitor clinically Cdiff neg   Acute hypoxemic respiratory failure now tracheostomy dependent after stroke - last on MV 2/21 Plan Ongoing pulm hygiene Trach care SLP following   Consider changing to cuffless trach in several days if he continues to do well   Acute kidney injury. Still anuric  - CRRT started 2/6 Plan Remains net negative 7.8 L, wts 104.4 (lowest thus far) Per Nephrology On CRRT with citrate, having catheter issues- high pressures, unable to tolerate higher flow rates, is tPA catheter.  If we have to change catheters, consider longer term catheter placement by IR  trending iCa Bladder scan prn, I/O for > 200 ml  Daily renal panel/ mag   Thrombocytopenia - HIT + (2/15) - platelets recovered Plan Avoiding heparin Continue bivalirudin SSA still pending from 2/16 Trending CBC  DM type II poorly controlled Plan Improved control from yesterday  Increase levemir from 30 to 35 units BID  SSI resistant  TF coverage 10 units q 4hr  Hx of cocaine abuse. Depression. - not active per UDS  Plan Continue  Prozac  Best practice:  Diet: NPO,  TFs DVT prophylaxis: SCDs/ bivalirudin GI prophylaxis: protonix Mobility: progress when able Code Status: full code Disposition: ICU  Family -pending   CCT: 35 mins   Brooke  Moshe Cipro, MSN, AGACNP-BC [facility] 05/13/2019, 9:26 AM

## 2019-05-13 NOTE — Progress Notes (Signed)
Advanced Heart Failure Rounding Note   Subjective:    Much more alert and interactive today.   Remains on trach collar.   On milrinone 0.125 and NE up to 20 for BP support.   WBC up to 21K yesterday but not rechecked today. BCx drawn. Remains AF.   Back on CVVHD. Keeping even.  CVP 2  Co-ox inaccurate (99%)    Objective:   Weight Range:  Vital Signs:   Temp:  [97.7 F (36.5 C)-97.8 F (36.6 C)] 97.8 F (36.6 C) (02/26 0800) Pulse Rate:  [35-88] 74 (02/26 1600) Resp:  [15-31] 20 (02/26 1600) BP: (76-126)/(43-82) 114/61 (02/26 1545) SpO2:  [92 %-100 %] 100 % (02/26 1600) FiO2 (%):  [28 %] 28 % (02/26 1130) Weight:  [104.4 kg] 104.4 kg (02/26 0500) Last BM Date: 05/13/19  Weight change: Filed Weights   05/11/19 0500 05/12/19 0500 05/13/19 0500  Weight: 105.4 kg 105.7 kg 104.4 kg    Intake/Output:   Intake/Output Summary (Last 24 hours) at 05/13/2019 1623 Last data filed at 05/13/2019 1600 Gross per 24 hour  Intake 4341.96 ml  Output 2959 ml  Net 1382.96 ml     Physical Exam: General:  Awake on trach. Will track, smile and follow commands HEENT: normal Neck: supple. + trach no JVD. Carotids 2+ bilat; no bruits. No lymphadenopathy or thryomegaly appreciated. Cor: PMI nondisplaced. Regular rate & rhythm. No rubs, gallops or murmurs. Lungs: clear Abdomen: obese soft, nontender, nondistended. No hepatosplenomegaly. No bruits or masses. Good bowel sounds. Extremities: no cyanosis, clubbing, rash, edema +RFV dialysis cath Neuro: Awake on trach. Will track, smile and follow commands   Telemetry: currently NSR 70s. Frequent bigeminy  Personally reviewed    Labs: Basic Metabolic Panel: Recent Labs  Lab 05/07/19 0206 05/07/19 0246 05/09/19 2215 05/09/19 2252 05/11/19 0445 05/11/19 0825 05/11/19 1645 05/11/19 1645 05/12/19 0206 05/12/19 0207 05/12/19 1408 05/12/19 1700 05/12/19 1805 05/13/19 0208 05/13/19 0503 05/13/19 0623 05/13/19 0626  05/13/19 1100  NA  --    < > 137   < > 140   < > 138   < >  --  138   < > 138   < > 140 137 141 139 140  K  --    < > 3.6   < > 3.9   < > 3.9   < >  --  4.2   < > 3.9   < > 3.7 3.7 3.8 3.8 4.5  CL  --    < > 94*   < > 88*   < > 98   < >  --  98   < > 98   < > 101 95* 99 101 105  CO2  --    < > 25   < > 30  --  24  --   --  22  --  22  --   --  23  --   --   --   GLUCOSE  --    < > 321*   < > 226*   < > 223*   < >  --  226*   < > 285*   < > 189* 248* 264* 239* 161*  BUN  --    < > 42*   < > 44*   < > 43*   < >  --  71*   < > 81*   < > 63* 65* 43* 55* 63*  CREATININE  --    < >  3.28*   < > 3.41*   < > 3.29*   < >  --  4.89*   < > 5.80*   < > 4.60* 4.32* 2.80* 4.30* 5.10*  CALCIUM  --    < > 9.4   < > 11.0*   < > 9.9   < >  --  10.0  --  9.7  --   --  9.6  --   --   --   MG 2.2  --  1.9  --  2.4  --   --   --  2.6*  --   --   --   --   --  2.3  --   --   --   PHOS  --    < >  --    < > 5.7*  --  5.7*  --   --  6.2*  --  6.1*  --   --  5.8*  --   --   --    < > = values in this interval not displayed.    Liver Function Tests: Recent Labs  Lab 05/09/19 0500 05/09/19 1655 05/11/19 0445 05/11/19 1645 05/12/19 0207 05/12/19 1700 05/13/19 0503  AST 44*  --   --   --   --   --   --   ALT 78*  --   --   --   --   --   --   ALKPHOS 164*  --   --   --   --   --   --   BILITOT 1.4*  --   --   --   --   --   --   PROT 8.2*  --   --   --   --   --   --   ALBUMIN 2.6*   < > 3.1* 2.9* 2.9* 2.8* 2.8*   < > = values in this interval not displayed.   No results for input(s): LIPASE, AMYLASE in the last 168 hours. No results for input(s): AMMONIA in the last 168 hours.  CBC: Recent Labs  Lab 05/07/19 0246 05/07/19 0343 05/08/19 0307 05/08/19 0816 05/09/19 0500 05/09/19 0631 05/12/19 0206 05/12/19 1408 05/13/19 0205 05/13/19 0208 05/13/19 0623 05/13/19 0626 05/13/19 1100  WBC 14.0*  --  18.3*  --  11.1*  --  21.4*  --   --   --   --   --   --   HGB 8.4*   < > 9.3*   < > 9.9*   < > 11.8*    < > 13.6 12.6* 13.9 12.6* 11.9*  HCT 26.4*   < > 29.2*   < > 32.3*   < > 38.0*   < > 40.0 37.0* 41.0 37.0* 35.0*  MCV 95.3  --  97.0  --  98.5  --  99.2  --   --   --   --   --   --   PLT 194  --  362  --  419*  --  625*  --   --   --   --   --   --    < > = values in this interval not displayed.    Cardiac Enzymes: No results for input(s): CKTOTAL, CKMB, CKMBINDEX, TROPONINI in the last 168 hours.  BNP: BNP (last 3 results) Recent Labs    12/13/18 1750 12/15/18 0405 04/23/19 0945  BNP 1,156.1* 1,161.0* 2,909.9*  ProBNP (last 3 results) No results for input(s): PROBNP in the last 8760 hours.    Other results:  Imaging: No results found.   Medications:     Scheduled Medications: . sodium chloride   Intravenous Once  . amiodarone  200 mg Per Tube BID  . aspirin  81 mg Per Tube Daily  . atorvastatin  40 mg Per Tube q1800  . chlorhexidine gluconate (MEDLINE KIT)  15 mL Mouth Rinse BID  . Chlorhexidine Gluconate Cloth  6 each Topical Q0600  . feeding supplement (PRO-STAT SUGAR FREE 64)  60 mL Per Tube TID  . FLUoxetine  20 mg Per Tube Daily  . insulin aspart  0-20 Units Subcutaneous Q4H  . insulin aspart  10 Units Subcutaneous Q4H  . insulin detemir  35 Units Subcutaneous BID  . ivabradine  5 mg Per Tube BID WC  . mouth rinse  15 mL Mouth Rinse 10 times per day  . midodrine  10 mg Per Tube TID WC  . pantoprazole sodium  40 mg Per Tube Q1200  . QUEtiapine  25 mg Per Tube QHS  . sodium chloride flush  10-40 mL Intracatheter Q12H  . sodium chloride flush  3 mL Intravenous Q12H    Infusions: . sodium chloride Stopped (05/04/19 0200)  . sodium chloride    . anticoagulant sodium citrate    . bivalirudin (ANGIOMAX) infusion 0.5 mg/mL (Non-ACS indications) 0.087 mg/kg/hr (05/13/19 1600)  . calcium gluconate infusion for CRRT 20 g (05/13/19 1157)  . feeding supplement (VITAL AF 1.2 CAL) 60 mL/hr at 05/13/19 0700  . milrinone 0.125 mcg/kg/min (05/13/19 1600)  .  norepinephrine (LEVOPHED) Adult infusion 15 mcg/min (05/13/19 1600)  . prismasol [id number] 4/0 300 mL/hr at 05/12/19 2309  . prismasol [id number] 4/0 2,000 mL/hr at 05/13/19 1533  . sodium citrate 2 %/dextrose 2.5% solution 3000 mL 260 mL/hr at 05/12/19 2351    PRN Medications: sodium chloride, sodium chloride, anticoagulant sodium citrate, bisacodyl, docusate, fentaNYL (SUBLIMAZE) injection, sodium chloride flush, sodium chloride flush   Assessment/Plan:   1. Acute on chronic systolic HF due to NICM-> cardiogenic shock - Echo EF < 20% - now on milrinone 0.125 mg and NE 20 Co-ox inaccurate - unable to wean off NE. Suspect due in part to low CVP. Weight weight down. CVP 2. Will give another liter of IVF to try to keep CVP > 5. Wean NE as tolerated. Repeat co-ox - Continue midodrine to facilitate pressor wean -> increase to 10 tid  2. AKI -> ESRD - started on CRRT 04/23/19 - remains anuric  - management per nephrology  - volume low. Will give back fluid - if unable to get him to tolerate iHD of pressors would be a palliative situation 3. Acute CVA with severe residual deficits due to extensive R cerebellar, midbrain, R MCA and L MCA infarcts  - likely cardioembolic - s/p clot extraction - neuro status continues to improve but remains quite weak bilaterally 4. Acute hypoxic respiratory failure - s/p trach  - on trach collar  5. Wide complex tachycardia/frequent PVCs/ventricular bigeminy - suspect sinus tach with bundle. Also with frequent PVCs/bigeminy - HR improved with ivabradine  - continue po amio  - Keep K >4.0, Mg >2.0  6. Leukocytosis - high risk for sepsis - repeat CBC - cultures redrawn 2/25  CRITICAL CARE Performed by: Glori Bickers  Total critical care time: 35 minutes  Critical care time was exclusive of separately billable procedures and treating other patients.  Critical  care was necessary to treat or prevent imminent or life-threatening deterioration.  Critical  care was time spent personally by me (independent of midlevel providers or residents) on the following activities: development of treatment plan with patient and/or surrogate as well as nursing, discussions with consultants, evaluation of patient's response to treatment, examination of patient, obtaining history from patient or surrogate, ordering and performing treatments and interventions, ordering and review of laboratory studies, ordering and review of radiographic studies, pulse oximetry and re-evaluation of patient's condition.   Length of Stay: 21   Glori Bickers  MD 05/13/2019, 4:23 PM  Advanced Heart Failure Team Pager [phone] (M-F; 7a - 4p)  Please contact [facility] for night-coverage after hours (4p -7a ) and weekends on [email or web address]

## 2019-05-13 NOTE — Progress Notes (Addendum)
ANTICOAGULATION CONSULT NOTE  Pharmacy Consult:  Bivalirudin Indication: stroke 2/5, LV thrombus 2/8, HIT 2/15   Allergies  Allergen Reactions  . Heparin Other (See Comments)    Heparin induced thrombocytopenia. 2/15 HIT OD 1.692.     Patient Measurements: Height: 6\' 1"  (185.4 cm) Weight: 230 lb 2.6 oz (104.4 kg) IBW/kg (Calculated) : 79.9    Vital Signs: Temp: 97.8 F (36.6 C) (02/26 0800) Temp Source: Oral (02/26 0800) BP: 114/61 (02/26 1545) Pulse Rate: 79 (02/26 1700)  Labs: Recent Labs    05/12/19 0206 05/12/19 0207 05/13/19 [id number] 05/13/19 0503 05/13/19 [id number] 05/13/19 0623 05/13/19 0626 05/13/19 1100 05/13/19 1234 05/13/19 1600  HGB 11.8*   < >   < >  --  13.9   < > 12.6* 11.9*  --   --   HCT 38.0*   < >   < >  --  41.0  --  37.0* 35.0*  --   --   PLT 625*  --   --   --   --   --   --   --   --   --   APTT 46*   < >  --  45*  --   --   --   --  50* 49*  CREATININE  --    < >   < > 4.32* 2.80*   < > 4.30* 5.10*  --  3.98*   < > = values in this interval not displayed.    Estimated Creatinine Clearance: 33.2 mL/min (A) (by C-G formula based on SCr of 3.98 mg/dL (H)).  Assessment: 34 yr old male with left MCA infarct - received TPA and revascularization with IR on 2/5. On 2/8 found to have small LV apical thrombus on ECHO. Pharmacy consulted to dose IV heparin, which was switched to bivalirudin 2/15 for HIT.    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 SRA still pending (drawn 2/16).    APTT subtherapeutic with no issue with infusion. Weight adjusted in pump to match actual body weight after discussing with RN. No bleeding reported. Hemoglobin is low but stable. Platelets are elevated on last check at 625.  Goal of Therapy:  APTT 50- 65 sec  Monitor platelets by anticoagulation protocol: Yes   Plan:  Increase bivalrudin to 0.104 mg/kg/hr (21.7  mL/hr)  Recheck aPTT in 2 hours - if remains therapeutic then will go back to  daily aPTTs. Daily aPTT and CBC F/u SRA from 2/16  Kaylanie Capili A. Levada Dy, PharmD, BCPS, FNKF Clinical Pharmacist [place] Please utilize Amion for appropriate phone number to reach the unit pharmacist ([place])  Addendum:  Two hour PTT decreased to 46, Will continue to increase drip to 0.125 mg/kg/hr and get repeat aPTT in 2 hours   05/13/2019, 5:12 PM

## 2019-05-13 NOTE — Progress Notes (Signed)
Nephrology Progress Note:  Patient ID: Carlos Brewer., male   DOB: 1985-09-28, 34 y.o.   MRN: [id number]   S:  On milrinone. Levo has been weaned to 13.  He was transitioned back to CRRT with citrate yesterday.  Note BF was reduced to 150.  Had 2.4 liters UF over 2/25 with CRRT.  Anuric.  Spoke with day and night shift nursing.  Seen on CRRT - 88/62 and HR 77 with right fem nontunneled cath; procedure supervised.  Review of systems: unable to obtain 2/2 pt AMS/nonverbal   O:BP (!) 86/43   Pulse 72   Temp 97.7 F (36.5 C) (Oral)   Resp 18   Ht 6' 1" (1.854 m)   Wt 104.4 kg   SpO2 100%   BMI 30.37 kg/m   Intake/Output Summary (Last 24 hours) at 05/13/2019 0721 Last data filed at 05/13/2019 0700 Gross per 24 hour  Intake 3645.84 ml  Output 3008 ml  Net 637.84 ml   Intake/Output: I/O last 3 completed shifts: In: 5003.9 [I.V.:2254.3; NG/GT:2320; IV Piggyback:429.6] Out: 7846 [Other:2408; NGEXB:2841]    Intake/Output this shift:  No intake/output data recorded. Weight change: -1.3 kg  Physical exam:   Gen: awake in bed, trach collar, nonverbal. following command to blink hard HEENT - trach in place CVS: S1S2 no rub Resp: mechanical breath sounds  Abd: soft/ND Ext: he has no edema Access: right femoral nontunneled catheter  Recent Labs  Lab 05/09/19 0500 05/09/19 0631 05/10/19 0504 05/10/19 0757 05/10/19 1557 05/10/19 1559 05/11/19 0445 05/11/19 0825 05/11/19 1645 05/11/19 1645 05/12/19 0207 05/12/19 1408 05/12/19 1700 05/12/19 1805 05/12/19 2152 05/12/19 2154 05/13/19 0205 05/13/19 0208 05/13/19 0503 05/13/19 0623 05/13/19 0626  NA 137   < > 140   < > 139   < > 140   < > 138   < > 138   < > 138   < > 141 140 142 140 137 141 139  K 3.9   < > 3.5   < > 3.8   < > 3.9   < > 3.9   < > 4.2   < > 3.9   < > 3.7 3.7 3.7 3.7 3.7 3.8 3.8  CL 92*   < > 92*   < > 91*   < > 88*   < > 98   < > 98   < > 98   < > 100 102 100 101 95* 99 101  CO2 26   < > 27  --   28  --  30  --  24  --  22  --  22  --   --   --   --   --  23  --   --   GLUCOSE 286*   < > 195*   < > 263*   < > 226*   < > 223*   < > 226*   < > 285*   < > 240* 216* 219* 189* 248* 264* 239*  BUN 39*   < > 37*   < > 38*   < > 44*   < > 43*   < > 71*   < > 81*   < > 50* 70* 47* 63* 65* 43* 55*  CREATININE 3.32*   < > 3.47*   < > 3.31*   < > 3.41*   < > 3.29*   < > 4.89*   < > 5.80*   < >  3.40* 5.30* 3.00* 4.60* 4.32* 2.80* 4.30*  ALBUMIN 2.6*   < > 2.9*  --  2.9*  --  3.1*  --  2.9*  --  2.9*  --  2.8*  --   --   --   --   --  2.8*  --   --   CALCIUM 9.7   < > 10.0  --  10.6*  --  11.0*  --  9.9  --  10.0  --  9.7  --   --   --   --   --  9.6  --   --   PHOS 3.9   < > 4.1  --  4.4  --  5.7*  --  5.7*  --  6.2*  --  6.1*  --   --   --   --   --  5.8*  --   --   AST 44*  --   --   --   --   --   --   --   --   --   --   --   --   --   --   --   --   --   --   --   --   ALT 78*  --   --   --   --   --   --   --   --   --   --   --   --   --   --   --   --   --   --   --   --    < > = values in this interval not displayed.   Liver Function Tests: Recent Labs  Lab 05/09/19 0500 05/09/19 1655 05/12/19 0207 05/12/19 1700 05/13/19 0503  AST 44*  --   --   --   --   ALT 78*  --   --   --   --   ALKPHOS 164*  --   --   --   --   BILITOT 1.4*  --   --   --   --   PROT 8.2*  --   --   --   --   ALBUMIN 2.6*   < > 2.9* 2.8* 2.8*   < > = values in this interval not displayed.   CBC: Recent Labs  Lab 05/07/19 0246 05/07/19 0343 05/08/19 0307 05/08/19 0816 05/09/19 0500 05/09/19 0631 05/12/19 0206 05/12/19 1408 05/13/19 0208 05/13/19 0623 05/13/19 0626  WBC 14.0*   < > 18.3*  --  11.1*  --  21.4*  --   --   --   --   HGB 8.4*   < > 9.3*   < > 9.9*   < > 11.8*   < > 12.6* 13.9 12.6*  HCT 26.4*   < > 29.2*   < > 32.3*   < > 38.0*   < > 37.0* 41.0 37.0*  MCV 95.3  --  97.0  --  98.5  --  99.2  --   --   --   --   PLT 194   < > 362  --  419*  --  625*  --   --   --   --    < > = values in  this interval not displayed.   Cardiac Enzymes: No results for input(s): CKTOTAL, CKMB, CKMBINDEX, TROPONINI in the last 168 hours.  CBG: Recent Labs  Lab 05/12/19 1224 05/12/19 1657 05/12/19 1938 05/12/19 2332 05/13/19 0420  GLUCAP 311* 265* 261* 226* 200*    Studies/Results: No results found. . sodium chloride   Intravenous Once  . amiodarone  200 mg Per Tube BID  . aspirin  81 mg Per Tube Daily  . atorvastatin  40 mg Per Tube q1800  . chlorhexidine gluconate (MEDLINE KIT)  15 mL Mouth Rinse BID  . Chlorhexidine Gluconate Cloth  6 each Topical Q0600  . feeding supplement (PRO-STAT SUGAR FREE 64)  60 mL Per Tube TID  . FLUoxetine  20 mg Per Tube Daily  . insulin aspart  0-20 Units Subcutaneous Q4H  . insulin aspart  10 Units Subcutaneous Q4H  . insulin detemir  30 Units Subcutaneous BID  . ivabradine  5 mg Per Tube BID WC  . mouth rinse  15 mL Mouth Rinse 10 times per day  . midodrine  5 mg Per Tube TID WC  . pantoprazole sodium  40 mg Per Tube Q1200  . QUEtiapine  25 mg Per Tube QHS  . sodium chloride flush  10-40 mL Intracatheter Q12H  . sodium chloride flush  3 mL Intravenous Q12H    BMET    Component Value Date/Time   NA 139 05/13/2019 0626   K 3.8 05/13/2019 0626   CL 101 05/13/2019 0626   CO2 23 05/13/2019 0503   GLUCOSE 239 (H) 05/13/2019 0626   BUN 55 (H) 05/13/2019 0626   CREATININE 4.30 (H) 05/13/2019 0626   CALCIUM 9.6 05/13/2019 0503   GFRNONAA 17 (L) 05/13/2019 0503   GFRAA 19 (L) 05/13/2019 0503   CBC    Component Value Date/Time   WBC 21.4 (H) 05/12/2019 0206   RBC 3.83 (L) 05/12/2019 0206   HGB 12.6 (L) 05/13/2019 0626   HCT 37.0 (L) 05/13/2019 0626   PLT 625 (H) 05/12/2019 0206   MCV 99.2 05/12/2019 0206   MCH 30.8 05/12/2019 0206   MCHC 31.1 05/12/2019 0206   RDW 14.6 05/12/2019 0206   LYMPHSABS 0.8 04/24/2019 0843   MONOABS 0.5 04/24/2019 0843   EOSABS 0.4 04/24/2019 0843   BASOSABS 0.0 04/24/2019 0843    Assessment/Plan:  1. AKIpresumably due to ATN with ischemic/nephrotoxic insults worsened by cardiogenic shock. Started on CRRT 04/23/19. Resumed CVVHD 05/06/19 due to hyperkalemia which has resolved. After trial of CRRT without citrate with clotting x multiple in short time, CRRT was initially held overnight.  Then resumed on 2/25 with citrate for clearance.  1. Continue CRRT.  Inc to 2.0 liters.  Nursing is increasing blood flow to 250 as tolerated for clearance.  May need to tPA catheter if recurrent issues with blood flow  2. Normal saline bolus 500 mL discussed with nurse to not remove volume with CRRT 3. Using 4K/0Ca baths for all fluids due to use of citrate.systemic and post-filter calcium being monitored per protocol 2. Cardiogenic shock-  Hx milrinone due to development of a fib with rvr and wide complex tachycardia.   - Bolus normal saline once as above  - Discussed with Dr. Haroldine Laws with advanced CHF who felt that he has a known severe NICM with EF <20% and did not feel that he would tolerate IHD but would continue to support with milrinone and midodrine/pressors as needed to help promote renal perfusion.  3. Leukocytosis  - trend and abx per primary team.  Note femoral catheter.  Blood cultures sent 2/26 4. Acute stroke- presumed embolic with bilateral infarcts  and LV thrombus- received tpa on admission 5. SVT/polymorphic VT- per Cardiology 6. Acute hypoxic respiratory failure- s/p trach  7. 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 HIT Ab+. 8. Disposition- overall prognosis is poor with ongoing RRT (has not tolerated stopping CVVHD due to hypercatabolic state and concern for poor tolerance of intermittent HD), trach, as well as cardiogenic shock/cardiomyopathy.  Claudia Desanctis, MD 05/13/2019 7:40 AM

## 2019-05-13 NOTE — Progress Notes (Signed)
ANTICOAGULATION CONSULT NOTE  Pharmacy Consult:  Bivalirudin Indication: stroke 2/5, LV thrombus 2/8, HIT 2/15   Allergies  Allergen Reactions  . Heparin Other (See Comments)    Heparin induced thrombocytopenia. 2/15 HIT OD 1.692.     Patient Measurements: Height: 6\' 1"  (185.4 cm) Weight: 230 lb 2.6 oz (104.4 kg) IBW/kg (Calculated) : 79.9    Vital Signs: Temp: 97.8 F (36.6 C) (02/26 0800) Temp Source: Oral (02/26 0800) BP: 108/55 (02/26 1315) Pulse Rate: 75 (02/26 1315)  Labs: Recent Labs    05/12/19 0206 05/12/19 0207 05/13/19 [id number] 05/13/19 [id number] 05/13/19 0503 05/13/19 0623 05/13/19 0626 05/13/19 1234  HGB 11.8*   < > 12.6*   < >  --  13.9 12.6*  --   HCT 38.0*   < > 37.0*  --   --  41.0 37.0*  --   PLT 625*  --   --   --   --   --   --   --   APTT 46*  --   --   --  45*  --   --  50*  CREATININE  --    < > 4.60*   < > 4.32* 2.80* 4.30*  --    < > = values in this interval not displayed.    Estimated Creatinine Clearance: 30.7 mL/min (A) (by C-G formula based on SCr of 4.3 mg/dL (H)).  Assessment: 34 yr old male with left MCA infarct - received TPA and revascularization with IR on 2/5. On 2/8 found to have small LV apical thrombus on ECHO. Pharmacy consulted to dose IV heparin, which was switched to bivalirudin 2/15 for HIT.    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 SRA still pending (drawn 2/16).    APTT now therapeutic after increasing bivalrudin using actual body weight based on protocol; no issue with infusion. Weight adjusted in pump to match actual body weight after discussing with RN. No bleeding reported. Hemoglobin is low but stable. Platelets are elevated on last check at 625.  Goal of Therapy:  APTT 50- 65 sec  Monitor platelets by anticoagulation protocol: Yes   Plan:  Continue bivalrudin to 0.087 mg/kg/hr (18.2 mL/hr)  Recheck aPTT in 2 hours to confirm - if remains therapeutic then will go back to daily  aPTTs. Daily aPTT and CBC F/u SRA from 2/16  Sloan Leiter, PharmD, BCPS, BCCCP Clinical Pharmacist Please refer to [facility] for [place] numbers 05/13/2019, 1:21 PM

## 2019-05-13 NOTE — Progress Notes (Signed)
Chaplain engaged in initial visit with Carlos Brewer and his mom.  Chaplain offered support to family as Carlos Brewer mom shared her faith with chaplain.  Carlos Brewer appeared to be in good spirits and smiled a couple of times throughout visit.   Mom was happy chaplain stopped by and chaplain will follow-up.

## 2019-05-13 NOTE — Progress Notes (Signed)
Assisted tele visit to patient with family member.  Adrean Heitz Anderson, RN   

## 2019-05-14 DIAGNOSIS — I639 Cerebral infarction, unspecified: Secondary | ICD-10-CM | POA: Diagnosis not present

## 2019-05-14 DIAGNOSIS — N170 Acute kidney failure with tubular necrosis: Secondary | ICD-10-CM | POA: Diagnosis not present

## 2019-05-14 DIAGNOSIS — R570 Cardiogenic shock: Secondary | ICD-10-CM | POA: Diagnosis not present

## 2019-05-14 DIAGNOSIS — I63312 Cerebral infarction due to thrombosis of left middle cerebral artery: Secondary | ICD-10-CM | POA: Diagnosis not present

## 2019-05-14 DIAGNOSIS — I498 Other specified cardiac arrhythmias: Secondary | ICD-10-CM

## 2019-05-14 LAB — POCT I-STAT, CHEM 8
BUN: 34 mg/dL — ABNORMAL HIGH (ref 6–20)
BUN: 40 mg/dL — ABNORMAL HIGH (ref 6–20)
BUN: 27 mg/dL — ABNORMAL HIGH (ref 6–20)
BUN: 39 mg/dL — ABNORMAL HIGH (ref 6–20)
BUN: 29 mg/dL — ABNORMAL HIGH (ref 6–20)
BUN: 36 mg/dL — ABNORMAL HIGH (ref 6–20)
Calcium, Ion: 0.41 mmol/L — CL (ref 1.15–1.40)
Calcium, Ion: 1.04 mmol/L — ABNORMAL LOW (ref 1.15–1.40)
Calcium, Ion: 0.34 mmol/L — CL (ref 1.15–1.40)
Calcium, Ion: 1.08 mmol/L — ABNORMAL LOW (ref 1.15–1.40)
Calcium, Ion: 0.38 mmol/L — CL (ref 1.15–1.40)
Calcium, Ion: 1.12 mmol/L — ABNORMAL LOW (ref 1.15–1.40)
Chloride: 101 mmol/L (ref 98–111)
Chloride: 98 mmol/L (ref 98–111)
Chloride: 100 mmol/L (ref 98–111)
Chloride: 99 mmol/L (ref 98–111)
Chloride: 100 mmol/L (ref 98–111)
Chloride: 97 mmol/L — ABNORMAL LOW (ref 98–111)
Creatinine, Ser: 2.4 mg/dL — ABNORMAL HIGH (ref 0.61–1.24)
Creatinine, Ser: 3.3 mg/dL — ABNORMAL HIGH (ref 0.61–1.24)
Creatinine, Ser: 1.9 mg/dL — ABNORMAL HIGH (ref 0.61–1.24)
Creatinine, Ser: 3.2 mg/dL — ABNORMAL HIGH (ref 0.61–1.24)
Creatinine, Ser: 2.3 mg/dL — ABNORMAL HIGH (ref 0.61–1.24)
Creatinine, Ser: 3.1 mg/dL — ABNORMAL HIGH (ref 0.61–1.24)
Glucose, Bld: 163 mg/dL — ABNORMAL HIGH (ref 70–99)
Glucose, Bld: 197 mg/dL — ABNORMAL HIGH (ref 70–99)
Glucose, Bld: 157 mg/dL — ABNORMAL HIGH (ref 70–99)
Glucose, Bld: 203 mg/dL — ABNORMAL HIGH (ref 70–99)
Glucose, Bld: 124 mg/dL — ABNORMAL HIGH (ref 70–99)
Glucose, Bld: 180 mg/dL — ABNORMAL HIGH (ref 70–99)
HCT: 32 % — ABNORMAL LOW (ref 39.0–52.0)
HCT: 33 % — ABNORMAL LOW (ref 39.0–52.0)
HCT: 32 % — ABNORMAL LOW (ref 39.0–52.0)
HCT: 35 % — ABNORMAL LOW (ref 39.0–52.0)
HCT: 31 % — ABNORMAL LOW (ref 39.0–52.0)
HCT: 34 % — ABNORMAL LOW (ref 39.0–52.0)
Hemoglobin: 10.9 g/dL — ABNORMAL LOW (ref 13.0–17.0)
Hemoglobin: 11.2 g/dL — ABNORMAL LOW (ref 13.0–17.0)
Hemoglobin: 10.9 g/dL — ABNORMAL LOW (ref 13.0–17.0)
Hemoglobin: 11.9 g/dL — ABNORMAL LOW (ref 13.0–17.0)
Hemoglobin: 10.5 g/dL — ABNORMAL LOW (ref 13.0–17.0)
Hemoglobin: 11.6 g/dL — ABNORMAL LOW (ref 13.0–17.0)
Potassium: 3.6 mmol/L (ref 3.5–5.1)
Potassium: 3.7 mmol/L (ref 3.5–5.1)
Potassium: 3.6 mmol/L (ref 3.5–5.1)
Potassium: 3.7 mmol/L (ref 3.5–5.1)
Potassium: 3.4 mmol/L — ABNORMAL LOW (ref 3.5–5.1)
Potassium: 3.5 mmol/L (ref 3.5–5.1)
Sodium: 140 mmol/L (ref 135–145)
Sodium: 140 mmol/L (ref 135–145)
Sodium: 139 mmol/L (ref 135–145)
Sodium: 141 mmol/L (ref 135–145)
Sodium: 139 mmol/L (ref 135–145)
Sodium: 142 mmol/L (ref 135–145)
TCO2: 25 mmol/L (ref 22–32)
TCO2: 27 mmol/L (ref 22–32)
TCO2: 25 mmol/L (ref 22–32)
TCO2: 28 mmol/L (ref 22–32)
TCO2: 28 mmol/L (ref 22–32)
TCO2: 29 mmol/L (ref 22–32)

## 2019-05-14 LAB — BASIC METABOLIC PANEL
Anion gap: 14 (ref 5–15)
BUN: 38 mg/dL — ABNORMAL HIGH (ref 6–20)
CO2: 27 mmol/L (ref 22–32)
Calcium: 9.1 mg/dL (ref 8.9–10.3)
Chloride: 98 mmol/L (ref 98–111)
Creatinine, Ser: 2.95 mg/dL — ABNORMAL HIGH (ref 0.61–1.24)
GFR calc Af Amer: 31 mL/min — ABNORMAL LOW (ref >60)
GFR calc non Af Amer: 26 mL/min — ABNORMAL LOW (ref >60)
Glucose, Bld: 151 mg/dL — ABNORMAL HIGH (ref 70–99)
Potassium: 3.3 mmol/L — ABNORMAL LOW (ref 3.5–5.1)
Sodium: 139 mmol/L (ref 135–145)

## 2019-05-14 LAB — APTT
aPTT: 55 seconds — ABNORMAL HIGH (ref 24–36)
aPTT: 57 seconds — ABNORMAL HIGH (ref 24–36)

## 2019-05-14 LAB — MAGNESIUM
Magnesium: 2.2 mg/dL (ref 1.7–2.4)
Magnesium: 2 mg/dL (ref 1.7–2.4)

## 2019-05-14 LAB — GLUCOSE, CAPILLARY
Glucose-Capillary: 158 mg/dL — ABNORMAL HIGH (ref 70–99)
Glucose-Capillary: 192 mg/dL — ABNORMAL HIGH (ref 70–99)
Glucose-Capillary: 194 mg/dL — ABNORMAL HIGH (ref 70–99)
Glucose-Capillary: 153 mg/dL — ABNORMAL HIGH (ref 70–99)
Glucose-Capillary: 155 mg/dL — ABNORMAL HIGH (ref 70–99)
Glucose-Capillary: 177 mg/dL — ABNORMAL HIGH (ref 70–99)

## 2019-05-14 LAB — RENAL FUNCTION PANEL
Albumin: 2.6 g/dL — ABNORMAL LOW (ref 3.5–5.0)
Albumin: 2.6 g/dL — ABNORMAL LOW (ref 3.5–5.0)
Anion gap: 16 — ABNORMAL HIGH (ref 5–15)
Anion gap: 15 (ref 5–15)
BUN: 44 mg/dL — ABNORMAL HIGH (ref 6–20)
BUN: 39 mg/dL — ABNORMAL HIGH (ref 6–20)
CO2: 25 mmol/L (ref 22–32)
CO2: 27 mmol/L (ref 22–32)
Calcium: 9 mg/dL (ref 8.9–10.3)
Calcium: 9.7 mg/dL (ref 8.9–10.3)
Chloride: 99 mmol/L (ref 98–111)
Chloride: 97 mmol/L — ABNORMAL LOW (ref 98–111)
Creatinine, Ser: 3.13 mg/dL — ABNORMAL HIGH (ref 0.61–1.24)
Creatinine, Ser: 2.95 mg/dL — ABNORMAL HIGH (ref 0.61–1.24)
GFR calc Af Amer: 29 mL/min — ABNORMAL LOW (ref >60)
GFR calc Af Amer: 31 mL/min — ABNORMAL LOW (ref >60)
GFR calc non Af Amer: 25 mL/min — ABNORMAL LOW (ref >60)
GFR calc non Af Amer: 26 mL/min — ABNORMAL LOW (ref >60)
Glucose, Bld: 163 mg/dL — ABNORMAL HIGH (ref 70–99)
Glucose, Bld: 139 mg/dL — ABNORMAL HIGH (ref 70–99)
Phosphorus: 4.3 mg/dL (ref 2.5–4.6)
Phosphorus: 4.3 mg/dL (ref 2.5–4.6)
Potassium: 3.5 mmol/L (ref 3.5–5.1)
Potassium: 3.5 mmol/L (ref 3.5–5.1)
Sodium: 140 mmol/L (ref 135–145)
Sodium: 139 mmol/L (ref 135–145)

## 2019-05-14 LAB — CALCIUM, IONIZED: Calcium, Ionized, Serum: 4.4 mg/dL — ABNORMAL LOW (ref 4.5–5.6)

## 2019-05-14 LAB — COOXEMETRY PANEL
Carboxyhemoglobin: 1.9 % — ABNORMAL HIGH (ref 0.5–1.5)
Methemoglobin: 0.6 % (ref 0.0–1.5)
O2 Saturation: 96.7 %
Total hemoglobin: 10.1 g/dL — ABNORMAL LOW (ref 12.0–16.0)

## 2019-05-14 MED ORDER — ALBUMIN HUMAN 5 % IV SOLN
INTRAVENOUS | Status: AC
  Administered 2019-05-14: 22:00:00 12.5 g via INTRAVENOUS
  Filled 2019-05-14: qty 250

## 2019-05-14 MED ORDER — SODIUM CHLORIDE 0.9 % IV BOLUS
500.0000 mL | Freq: Once | INTRAVENOUS | Status: AC
  Administered 2019-05-14: 500 mL via INTRAVENOUS

## 2019-05-14 MED ORDER — ALBUMIN HUMAN 5 % IV SOLN: 12.5000 g | Freq: Once | INTRAVENOUS | Status: AC

## 2019-05-14 NOTE — Progress Notes (Signed)
Nephrology Progress Note:  Patient ID: Carlos Warman., male   DOB: Jul 16, 1985, 34 y.o.   MRN: [id number]   S:  On milrinone. Levo has been weaned to 65.  Got a 500 mL normal saline bolus (was not removed with CRRT).  He has around 170 ml/hr inputs.  Had 1.9 liters UF over 2/26 with CRRT.  Anuric. 500 mL stool.  Seen on CRRT - 115/58 and HR 68 with right fem nontunneled cath; procedure supervised.  Access tpa'd yesterday.  Review of systems: unable to obtain 2/2 pt nonverbal   O:BP (!) 115/58   Pulse 74   Temp 99.3 F (37.4 C) (Axillary)   Resp 17   Ht 6' 1" (1.854 m)   Wt 108.7 kg   SpO2 100%   BMI 31.62 kg/m   Intake/Output Summary (Last 24 hours) at 05/14/2019 0643 Last data filed at 05/14/2019 0600 Gross per 24 hour  Intake 5489.56 ml  Output 2542 ml  Net 2947.56 ml   Intake/Output: I/O last 3 completed shifts: In: 6785.5 [I.V.:2850.3; NG/GT:2260; IV Piggyback:1675.3] Out: 5188 [CZYSA:6301; Stool:800]    Intake/Output this shift:  Total I/O In: 2201.9 [I.V.:1149.9; NG/GT:552; IV Piggyback:500] Out: 1734 [Other:1429; Stool:305] Weight change: 4.3 kg  Physical exam:   Gen: awake in bed, trach collar, nonverbal. following commands (I.e. to blink hard) HEENT - trach in place CVS: S1S2 no rub Resp: coarse with occ rhonchi mechanical breath sounds  Abd: soft/ND Ext: he has no edema Access: right femoral nontunneled catheter  Recent Labs  Lab 05/09/19 0500 05/09/19 0631 05/11/19 0445 05/11/19 0825 05/11/19 1645 05/11/19 1645 05/12/19 0207 05/12/19 1408 05/12/19 1700 05/12/19 1805 05/13/19 0503 05/13/19 0623 05/13/19 1600 05/13/19 1923 05/13/19 2116 05/13/19 2144 05/13/19 2205 05/14/19 0428 05/14/19 0443 05/14/19 0444 05/14/19 0639  NA 137   < > 140   < > 138   < > 138   < > 138   < > 137   < > 140   < > 142 141 140 140 140 140 139  K 3.9   < > 3.9   < > 3.9   < > 4.2   < > 3.9   < > 3.7   < > 3.9   < > 3.9 3.8 3.6 3.5 3.6 3.7 3.6  CL 92*   < >  88*   < > 98   < > 98   < > 98   < > 95*   < > 103   < > 102 103 103 99 98 101 100  CO2 26   < > 30   < > 24  --  22  --  22  --  23  --  22  --   --   --  23 25  --   --   --   GLUCOSE 286*   < > 226*   < > 223*   < > 226*   < > 285*   < > 248*   < > 223*   < > 133* 93 117* 163* 197* 163* 157*  BUN 39*   < > 44*   < > 43*   < > 71*   < > 81*   < > 65*   < > 61*   < > 34* 43* 48* 44* 34* 40* 39*  CREATININE 3.32*   < > 3.41*   < > 3.29*   < > 4.89*   < > 5.80*   < >  4.32*   < > 3.98*   < > 2.40* 3.60* 3.58* 3.13* 2.40* 3.30* 3.20*  ALBUMIN 2.6*   < > 3.1*  --  2.9*  --  2.9*  --  2.8*  --  2.8*  --  2.6*  --   --   --   --  2.6*  --   --   --   CALCIUM 9.7   < > 11.0*   < > 9.9  --  10.0  --  9.7  --  9.6  --  9.0  --   --   --  8.7* 9.0  --   --   --   PHOS 3.9   < > 5.7*  --  5.7*  --  6.2*  --  6.1*  --  5.8*  --  5.2*  --   --   --   --  4.3  --   --   --   AST 44*  --   --   --   --   --   --   --   --   --   --   --   --   --   --   --   --   --   --   --   --   ALT 78*  --   --   --   --   --   --   --   --   --   --   --   --   --   --   --   --   --   --   --   --    < > = values in this interval not displayed.   Liver Function Tests: Recent Labs  Lab 05/09/19 0500 05/09/19 1655 05/13/19 0503 05/13/19 1600 05/14/19 0428  AST 44*  --   --   --   --   ALT 78*  --   --   --   --   ALKPHOS 164*  --   --   --   --   BILITOT 1.4*  --   --   --   --   PROT 8.2*  --   --   --   --   ALBUMIN 2.6*   < > 2.8* 2.6* 2.6*   < > = values in this interval not displayed.   CBC: Recent Labs  Lab 05/08/19 0307 05/08/19 0816 05/09/19 0500 05/09/19 0631 05/12/19 0206 05/12/19 1408 05/13/19 1649 05/13/19 1923 05/14/19 0443 05/14/19 0444 05/14/19 0639  WBC 18.3*  --  11.1*  --  21.4*  --  14.4*  --   --   --   --   HGB 9.3*   < > 9.9*   < > 11.8*   < > 10.0*   < > 11.2* 10.9* 10.9*  HCT 29.2*   < > 32.3*   < > 38.0*   < > 32.6*   < > 33.0* 32.0* 32.0*  MCV 97.0  --  98.5  --  99.2  --   100.9*  --   --   --   --   PLT 362  --  419*  --  625*  --  431*  --   --   --   --    < > = values in this interval not displayed.   Cardiac Enzymes: No results for input(s): CKTOTAL, CKMB,  CKMBINDEX, TROPONINI in the last 168 hours. CBG: Recent Labs  Lab 05/13/19 1143 05/13/19 1520 05/13/19 1933 05/13/19 2331 05/14/19 0318  GLUCAP 172* 191* 134* 178* 158*    Studies/Results: No results found. . sodium chloride   Intravenous Once  . amiodarone  200 mg Per Tube BID  . aspirin  81 mg Per Tube Daily  . atorvastatin  40 mg Per Tube q1800  . chlorhexidine gluconate (MEDLINE KIT)  15 mL Mouth Rinse BID  . Chlorhexidine Gluconate Cloth  6 each Topical Q0600  . feeding supplement (PRO-STAT SUGAR FREE 64)  60 mL Per Tube TID  . FLUoxetine  20 mg Per Tube Daily  . insulin aspart  0-20 Units Subcutaneous Q4H  . insulin aspart  10 Units Subcutaneous Q4H  . insulin detemir  35 Units Subcutaneous BID  . ivabradine  5 mg Per Tube BID WC  . mouth rinse  15 mL Mouth Rinse 10 times per day  . midodrine  10 mg Per Tube TID WC  . pantoprazole sodium  40 mg Per Tube Q1200  . QUEtiapine  25 mg Per Tube QHS  . sodium chloride flush  10-40 mL Intracatheter Q12H  . sodium chloride flush  3 mL Intravenous Q12H    BMET    Component Value Date/Time   NA 139 05/14/2019 0639   K 3.6 05/14/2019 0639   CL 100 05/14/2019 0639   CO2 25 05/14/2019 0428   GLUCOSE 157 (H) 05/14/2019 0639   BUN 39 (H) 05/14/2019 0639   CREATININE 3.20 (H) 05/14/2019 0639   CALCIUM 9.0 05/14/2019 0428   GFRNONAA 25 (L) 05/14/2019 0428   GFRAA 29 (L) 05/14/2019 0428   CBC    Component Value Date/Time   WBC 14.4 (H) 05/13/2019 1649   RBC 3.23 (L) 05/13/2019 1649   HGB 10.9 (L) 05/14/2019 0639   HCT 32.0 (L) 05/14/2019 0639   PLT 431 (H) 05/13/2019 1649   MCV 100.9 (H) 05/13/2019 1649   MCH 31.0 05/13/2019 1649   MCHC 30.7 05/13/2019 1649   RDW 14.7 05/13/2019 1649   LYMPHSABS 0.8 04/24/2019 0843   MONOABS  0.5 04/24/2019 0843   EOSABS 0.4 04/24/2019 0843   BASOSABS 0.0 04/24/2019 0843   Assessment/Plan:  1. AKIpresumably due to ATN with ischemic/nephrotoxic insults worsened by cardiogenic shock. Started on CRRT 04/23/19. Resumed CVVHD 05/06/19 due to hyperkalemia which has resolved. After trial of CRRT without citrate with clotting x multiple in short time, CRRT was initially held overnight.  Then resumed on 2/25 with citrate for clearance.  1. Continue CRRT.  Increased dialysate to 2.0 liters/hr on 2/26 and asked today again to increase BF.  Tolerating 200 ml/min and will attempt 250.  Keep even for now. 2. Using 4K/0Ca baths for all fluids due to use of citrate.systemic and post-filter calcium being monitored per protocol 2. Cardiogenic shock-  Hx milrinone due to development of a fib with rvr and wide complex tachycardia.   - Bolus normal saline once as above  - Please concentrate all inputs as able  - Discussed with Dr. Haroldine Laws with advanced CHF who felt that he has a known severe NICM with EF <20% and did not feel that he would tolerate IHD but would continue to support with milrinone and midodrine/pressors as needed to help promote renal perfusion.  3. Leukocytosis  - improving last check. abx per primary team.  Note femoral catheter.  Blood cultures sent 2/25 - listed as in process  4. Acute  stroke- presumed embolic with bilateral infarcts and LV thrombus- received tpa on admission 5. SVT/polymorphic VT- per Cardiology 6. Acute hypoxic respiratory failure- s/p trach  7. 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 HIT Ab+. 8. Disposition- overall prognosis is poor with ongoing RRT (has not tolerated stopping CVVHD due to hypercatabolic state and concern for poor tolerance of intermittent HD), trach, as well as cardiogenic shock/cardiomyopathy.  Claudia Desanctis, MD 05/14/2019 6:56 AM

## 2019-05-14 NOTE — Progress Notes (Signed)
ANTICOAGULATION CONSULT NOTE  Pharmacy Consult:  Bivalirudin Indication: stroke 2/5, LV thrombus 2/8, HIT 2/15   Allergies  Allergen Reactions  . Heparin Other (See Comments)    Heparin induced thrombocytopenia. 2/15 HIT OD 1.692.     Patient Measurements: Height: 6\' 1"  (185.4 cm) Weight: 239 lb 10.2 oz (108.7 kg) IBW/kg (Calculated) : 79.9    Vital Signs: Temp: 99.3 F (37.4 C) (02/27 0400) Temp Source: Axillary (02/27 0400) BP: 125/52 (02/27 0645) Pulse Rate: 34 (02/27 0645)  Labs: Recent Labs    05/12/19 0206 05/12/19 0207 05/13/19 1649 05/13/19 1923 05/13/19 1929 05/13/19 2116 05/13/19 2144 05/13/19 2205 05/13/19 2259 05/14/19 0428 05/14/19 0443 05/14/19 0443 05/14/19 0444 05/14/19 0639  HGB 11.8*   < > 10.0*   < >  --    < >   < >  --   --   --  11.2*   < > 10.9* 10.9*  HCT 38.0*   < > 32.6*   < >  --    < >   < >  --   --   --  33.0*  --  32.0* 32.0*  PLT 625*  --  431*  --   --   --   --   --   --   --   --   --   --   --   APTT 46*   < >  --    < > 46*  --   --   --  47* 55*  --   --   --   --   CREATININE  --    < >  --    < >  --    < >   < >   < >  --  3.13* 2.40*  --  3.30* 3.20*   < > = values in this interval not displayed.    Estimated Creatinine Clearance: 42.1 mL/min (A) (by C-G formula based on SCr of 3.2 mg/dL (H)).  Assessment: 34 yr old male with left MCA infarct - received TPA and revascularization with IR on 2/5. On 2/8 found to have small LV apical thrombus on ECHO. Pharmacy consulted to dose IV heparin, which was switched to bivalirudin 2/15 for HIT.    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 SRA still pending (drawn 2/16).    APTT subtherapeutic with no issue with infusion. Weight adjusted in pump to match actual body weight after discussing with RN. No bleeding reported. Hemoglobin is low but stable. Platelets are elevated on last check at 625.  2/27 AM update:  APTT therapeutic x 1 after rate  increase  Goal of Therapy:  APTT 50- 65 sec  Monitor platelets by anticoagulation protocol: Yes   Plan:  Cont bivalrudin at 0.175 mg/kg/hr Recheck aPTT in 2 hours - if remains therapeutic then will go back to daily aPTTs. Daily aPTT and CBC  Narda Bonds, PharmD, BCPS Clinical Pharmacist Phone: [phone]

## 2019-05-14 NOTE — Progress Notes (Addendum)
eLink Physician-Brief Progress Note Patient Name: Carlos Brewer. DOB: 03-27-1985 MRN: [id number]   Date of Service  05/14/2019  HPI/Events of Note  Pt with drop in blood pressure, sinus tachycardia following initiation of ultrafiltration.  eICU Interventions  Albumin  5 %  50  ml fluid bolus was given with BP increase to 119/56 and HR down to 75, BMET, Mg++ stat.        Frederik Pear 05/14/2019, 9:36 PM

## 2019-05-14 NOTE — Progress Notes (Signed)
Assisted tele visit to patient with family member.  Allycia Pitz P, RN  

## 2019-05-14 NOTE — Progress Notes (Signed)
NAME:  Carlos Stowers., MRN:  [id number], DOB:  12-15-1985, LOS: 68 ADMISSION DATE:  04/24/2019, CONSULTATION DATE:  04/24/2019 REFERRING MD:  Dr. Leonel Ramsay, CHIEF COMPLAINT:  Slurred speech  Brief History   34 yo male smoker found to have slurred speech and Rt sided weakness.  Admitted with left MCA CVA and multiple smaller embolic infarcts s/p tPA and thrombectomy in IR.  Additionally found to have left ventricular atrial thrombus.    Course complicated by septic shock r/t HCAP, AKI requiring CRRT, polymorphic VT and cardiogenic shock.   Past Medical History  Systolic CHF with non ischemic CM, Cocaine abuse, OSA, DM Hx of CHF (EF 15%)  Hx myocarditis Smoker 1/2 ppd   Significant Hospital Events   2/05 Admit, tPA, IR thrombectomy 2/6 100% on fvent at 2300  Overnight requiring increasing levophed and phenylephrine.    2/6: switched to levophed, epi, started antibiotics, started on CRRT.  2/9  developed wide-complex tachycardia and hypotension, started on amiodarone drip, increase Levophed drip 2/15 drop in platelets, heparin stopped, bival started 2/17 Hypotensive and back on Levophed 2/18 trach, lines changed 2/19 started milrinone , fever 103 2/20 Episode of wide-complex tachycardia yesterday, changed from Levophed to vasopressin 2/22 stopping vanc. Still on inotrope support. Some AF w/ RVR. Had to be placed back on pressors. ivabradine added 2/23 still on pressors/ CRRT continued 2/24 tmax 98.4, Remains on CRRT, even UF, remains anuric, Levophed at 14 mcg/min, Milrinone at 0.125 mcg/kg/min, Coox 63.4 Doing well this morning on ATC, no events overnight.  Midodrine added   2/25 more interactive, remains on ATC, tmax 99.5/ WBC 21.4, ~900 ml liquid stool/ 24 hours, neg Cdiff, Levophed up to 20 mcg/min, CVP 2, ,milrinone remains at 0.125 mcg/kg/min, coox 92.7, Off CRRT since last night ~2000 s/p clotted x 3 off citrate, restarted   Consults:  Neuro IR Cardiology   Nephrology Heart Failure  EP   Procedures:  2/5-2/6:  TPA given at 428 am (total of 90 Mg) 520 am went to IR  S/P Lt common carotid arteriogram followed by complete revascularization of occluded LT MCA sup division mid M2 seg with x 1 pass with 6mx 40 mm solitaire X ret river device and penumbra aspiration with TICI 3 revascularization  ETT 2/05 >> 2/18 [facility] CVL 2/5 >>2/18  RT [place] CVL 2/18 >> RIJ HD cath 2/6 >>2/18 6 shiley cuffed trach 2/18  >>  RT fem HD 2/19 >>  Significant Diagnostic Tests:  CT angio head/neck 2/05 >> occlusion of Lt MCA bifurcation Echo 2/05 >> EF less than 20%, cannot rule out apical thrombus MRI brain 2/11 > extensive acute infarction of multiple areas without large or medium vessel occlusion Echocardiogram 2/8 Left ventricular ejection fraction, by estimation, is <20%. The left  ventricle has severely decreased function. The left ventricle demonstrates  global hypokinesis. The left ventricular internal cavity size was severely  dilated. Possible small 0.8 x  0.6 cm apical thrombus. Somewhat subtle finding, could confirm with  cardiac MRI. However, if patient has had CVA, would be reasonable to  anticoagulate given severity of LV dysfunction if no contraindications to  anticoagulation.   Micro Data:  SARS CoV2 PCR 2/05 >> negative Influenza PCR 2/05 >> negative Urine 2/6 >> ng resp 2/6 >> nml flora BC 2/6 >>ng resp 2/16 >> [facility] negative  BC x 2 2/25 >> Cdiff 2/25 >> neg  Antimicrobials:  mero 2/6 -2/12 Zosyn 2/6 , 2/19 >> 2/23 vanc 2/6 >>  2/9 , 2/19 >>2/22  Interim history/subjective:  RN reports initiate events overnight, CVP is ranging between 5-7.  Patient does wean off Levophed.  Remains on CRRT and milrinone  Objective   Blood pressure 110/67, pulse 75, temperature 97.8 F (36.6 C), temperature source Axillary, resp. rate 19, height 6' 1"  (1.854 m), weight 108.7 kg, SpO2 100 %. CVP:  [0 mmHg-46 mmHg] 14 mmHg  FiO2 (%):   [28 %] 28 %   Intake/Output Summary (Last 24 hours) at 05/14/2019 1148 Last data filed at 05/14/2019 1100 Gross per 24 hour  Intake 5186.29 ml  Output 2869 ml  Net 2317.29 ml   Filed Weights   05/12/19 0500 05/13/19 0500 05/14/19 0500  Weight: 105.7 kg 104.4 kg 108.7 kg    Examination: General: Adult African-American male on ATC lying in bed no acute distress HEENT: Trach midline, MM pink/moist, PERRL,  Neuro: Alert and able to follow simple commands CV: s1s2 regular rate and rhythm, no murmur, rubs, or gallops,  PULM: Clear to auscultation bilaterally, no increased work of breathing or added breath sounds GI: soft, bowel sounds active in all 4 quadrants, non-tender, non-distended, tolerating TF Extremities: warm/dry, no edema  Skin: no rashes or lesions   Resolved Hospital Problem list     Assessment & Plan:   Left MCA CVA Multiple other infarcts noted on MRI 5/42-[id number] embolic Acute metabolic encephalopathy P: Supportive care PT/OT efforts as able Continue nightly Seroquel  Acute systolic HF with cardiogenic shock in setting of NICM, ejection fraction is less than 20% P: Heart failure team following, appreciate assistance Getting coox Continue milrinone infusion per heart failure Continue increase midodrine Trend CVP, ideally daily goal 4-6 Potassium goal greater than 4, magnesium goal greater than 2 Supplement electrolytes as needed  Left ventricle apical thrombus non ischemic cardiomyopathy with systolic congestive heart failure Polymorphic VT on 2/9, WCT 2/19 HLD P: Continuous telemetry Primary management per cardiology Remains on p.o. amiodarone and Ivabradine Daily aspirin and Lipitor  Leukocytosis, improving - treated for HCAP 2/19- 2/23 zosyn/ vanc Plan Continue to remain afebrile No obvious source of infection Monitor closely Trend CBC and fever curve  Acute hypoxemic respiratory failure now tracheostomy dependent after stroke - last on MV  2/21 Plan Continue aggressive pulmonary hygiene Trach care Remains on ATC, tolerating well SLP following Can consider transitioning to cuffless trach if continues to do well next several days  Acute kidney injury. Still anuric  - CRRT started 2/6 Plan Remains net negative, currently 3.5 L Primary management per nephrology Remains on CRRT May need long-term catheter placement  Bladder scans as needed Follow renal panel and magnesium  Thrombocytopenia - HIT + (2/15) - platelets recovered -SRA positive at 90 Plan Avoid penicillin Continue Bivalirudin Trend CBC  DM type II poorly controlled Plan Improved control Continue to monitor and short acting insulin coverage SSI resistant scale To be coverage  Hx of cocaine abuse. Depression. - not active per UDS  Plan Continue Prozac  Best practice:  Diet: NPO,  TFs DVT prophylaxis: SCDs/ bivalirudin GI prophylaxis: protonix Mobility: progress when able Code Status: full code Disposition: ICU  Family -pending    Performed by: Johnsie Cancel   Total critical care time: 34 minutes  Critical care time was exclusive of separately billable procedures and treating other patients.  Critical care was necessary to treat or prevent imminent or life-threatening deterioration.  Critical care was time spent personally by me on the following activities: development of treatment plan with patient  and/or surrogate as well as nursing, discussions with consultants, evaluation of patient's response to treatment, examination of patient, obtaining history from patient or surrogate, ordering and performing treatments and interventions, ordering and review of laboratory studies, ordering and review of radiographic studies, pulse oximetry and re-evaluation of patient's condition.   Johnsie Cancel, NP-C [facility] Contact / Pager information can be found on Amion  05/14/2019, 12:34 PM

## 2019-05-14 NOTE — Progress Notes (Signed)
Pt with run of ST with drop in BP. Levophed titrated, EKG obtained, K and Mg sent. Dialysis set for filtration only. Will return to fluid removal when OK by physician and vital signs return to normal

## 2019-05-14 NOTE — Progress Notes (Signed)
Advanced Heart Failure Rounding Note   Subjective:    Alert and interactive though nonverbal.   Remains on trach collar.   Given IVF yesterday for CVP 2. On milrinone 0.125 and NE 20 ->8  WBC up to 21K -> 14.4 yesterday but not rechecked today. BCx 2/25 drawn. NGTD  Remains afebrile  Back on CVVHD. Keeping even. Having profuse diarrhea.   CVP 2-3 (checked personally)  Co-ox inaccurate (97%)   Objective:   Weight Range:  Vital Signs:   Temp:  [97.8 F (36.6 C)-99.3 F (37.4 C)] 97.8 F (36.6 C) (02/27 0800) Pulse Rate:  [32-83] 75 (02/27 1000) Resp:  [15-25] 19 (02/27 1000) BP: (79-133)/(50-90) 110/67 (02/27 1000) SpO2:  [97 %-100 %] 100 % (02/27 1000) FiO2 (%):  [28 %] 28 % (02/27 0817) Weight:  [108.7 kg] 108.7 kg (02/27 0500) Last BM Date: 05/14/19  Weight change: Filed Weights   05/12/19 0500 05/13/19 0500 05/14/19 0500  Weight: 105.7 kg 104.4 kg 108.7 kg    Intake/Output:   Intake/Output Summary (Last 24 hours) at 05/14/2019 1129 Last data filed at 05/14/2019 1100 Gross per 24 hour  Intake 5186.29 ml  Output 2869 ml  Net 2317.29 ml     Physical Exam: General:  Awake on trach. Will track, smile and follow commands HEENT: normal Neck: supple. no JVD. Carotids 2+ bilat; no bruits. No lymphadenopathy or thryomegaly appreciated. Cor: PMI nondisplaced. Regular rate & rhythm. +s3 Lungs: clear Abdomen: obese soft, nontender, nondistended. No hepatosplenomegaly. No bruits or masses. Good bowel sounds. Extremities: no cyanosis, clubbing, rash, edema Neuro: Awake on trach. Will track, smile and follow commands   Telemetry:  Sinus 70s in/out bigeminy Personally reviewed    Labs: Basic Metabolic Panel: Recent Labs  Lab 05/09/19 2215 05/09/19 2252 05/11/19 0445 05/11/19 0825 05/11/19 1645 05/12/19 0206 05/12/19 0207 05/12/19 1408 05/12/19 1700 05/12/19 1805 05/13/19 0503 05/13/19 0623 05/13/19 1600 05/13/19 1923 05/13/19 2205 05/14/19 0428  05/14/19 0443 05/14/19 0444 05/14/19 0639  NA 137   < > 140   < >   < >  --  138   < > 138   < > 137   < > 140   < > 140 140 140 140 139  K 3.6   < > 3.9   < >   < >  --  4.2   < > 3.9   < > 3.7   < > 3.9   < > 3.6 3.5 3.6 3.7 3.6  CL 94*   < > 88*   < >   < >  --  98   < > 98   < > 95*   < > 103   < > 103 99 98 101 100  CO2 25   < > 30   < >   < >  --  22  --  22  --  23  --  22  --  23 25  --   --   --   GLUCOSE 321*   < > 226*   < >   < >  --  226*   < > 285*   < > 248*   < > 223*   < > 117* 163* 197* 163* 157*  BUN 42*   < > 44*   < >   < >  --  71*   < > 81*   < > 65*   < > 61*   < >  48* 44* 34* 40* 39*  CREATININE 3.28*   < > 3.41*   < >   < >  --  4.89*   < > 5.80*   < > 4.32*   < > 3.98*   < > 3.58* 3.13* 2.40* 3.30* 3.20*  CALCIUM 9.4   < > 11.0*   < >   < >  --  10.0  --  9.7   < > 9.6   < > 9.0  --  8.7* 9.0  --   --   --   MG 1.9  --  2.4  --   --  2.6*  --   --   --   --  2.3  --   --   --   --  2.2  --   --   --   PHOS  --    < > 5.7*   < >   < >  --  6.2*  --  6.1*  --  5.8*  --  5.2*  --   --  4.3  --   --   --    < > = values in this interval not displayed.    Liver Function Tests: Recent Labs  Lab 05/09/19 0500 05/09/19 1655 05/12/19 0207 05/12/19 1700 05/13/19 0503 05/13/19 1600 05/14/19 0428  AST 44*  --   --   --   --   --   --   ALT 78*  --   --   --   --   --   --   ALKPHOS 164*  --   --   --   --   --   --   BILITOT 1.4*  --   --   --   --   --   --   PROT 8.2*  --   --   --   --   --   --   ALBUMIN 2.6*   < > 2.9* 2.8* 2.8* 2.6* 2.6*   < > = values in this interval not displayed.   No results for input(s): LIPASE, AMYLASE in the last 168 hours. No results for input(s): AMMONIA in the last 168 hours.  CBC: Recent Labs  Lab 05/08/19 0307 05/08/19 0816 05/09/19 0500 05/09/19 0631 05/12/19 0206 05/12/19 1408 05/13/19 1649 05/13/19 1923 05/13/19 2116 05/13/19 2144 05/14/19 0443 05/14/19 0444 05/14/19 0639  WBC 18.3*  --  11.1*  --  21.4*  --   14.4*  --   --   --   --   --   --   HGB 9.3*   < > 9.9*   < > 11.8*   < > 10.0*   < > 11.9* 10.5* 11.2* 10.9* 10.9*  HCT 29.2*   < > 32.3*   < > 38.0*   < > 32.6*   < > 35.0* 31.0* 33.0* 32.0* 32.0*  MCV 97.0  --  98.5  --  99.2  --  100.9*  --   --   --   --   --   --   PLT 362  --  419*  --  625*  --  431*  --   --   --   --   --   --    < > = values in this interval not displayed.    Cardiac Enzymes: No results for input(s): CKTOTAL, CKMB, CKMBINDEX, TROPONINI in the last 168 hours.  BNP: BNP (  last 3 results) Recent Labs    12/13/18 1750 12/15/18 0405 04/23/19 0945  BNP 1,156.1* 1,161.0* 2,909.9*    ProBNP (last 3 results) No results for input(s): PROBNP in the last 8760 hours.    Other results:  Imaging: No results found.   Medications:     Scheduled Medications: . sodium chloride   Intravenous Once  . amiodarone  200 mg Per Tube BID  . aspirin  81 mg Per Tube Daily  . atorvastatin  40 mg Per Tube q1800  . chlorhexidine gluconate (MEDLINE KIT)  15 mL Mouth Rinse BID  . Chlorhexidine Gluconate Cloth  6 each Topical Q0600  . feeding supplement (PRO-STAT SUGAR FREE 64)  60 mL Per Tube TID  . FLUoxetine  20 mg Per Tube Daily  . insulin aspart  0-20 Units Subcutaneous Q4H  . insulin aspart  10 Units Subcutaneous Q4H  . insulin detemir  35 Units Subcutaneous BID  . ivabradine  5 mg Per Tube BID WC  . mouth rinse  15 mL Mouth Rinse 10 times per day  . midodrine  10 mg Per Tube TID WC  . pantoprazole sodium  40 mg Per Tube Q1200  . QUEtiapine  25 mg Per Tube QHS  . sodium chloride flush  10-40 mL Intracatheter Q12H  . sodium chloride flush  3 mL Intravenous Q12H    Infusions: . sodium chloride Stopped (05/04/19 0200)  . sodium chloride    . anticoagulant sodium citrate    . bivalirudin (ANGIOMAX) infusion 0.5 mg/mL (Non-ACS indications) 0.175 mg/kg/hr (05/14/19 1100)  . calcium gluconate infusion for CRRT 20 g (05/14/19 0845)  . feeding supplement (VITAL AF  1.2 CAL) 1,000 mL (05/13/19 2048)  . milrinone 0.125 mcg/kg/min (05/14/19 1100)  . norepinephrine (LEVOPHED) Adult infusion 8 mcg/min (05/14/19 1100)  . prismasol [id number] 4/0 300 mL/hr at 05/13/19 2244  . prismasol B22GK 4/0 2,000 mL/hr at 05/14/19 0650  . sodium citrate 2 %/dextrose 2.5% solution 3000 mL 300 mL/hr at 05/14/19 0500    PRN Medications: sodium chloride, sodium chloride, anticoagulant sodium citrate, bisacodyl, docusate, fentaNYL (SUBLIMAZE) injection, sodium chloride flush, sodium chloride flush   Assessment/Plan:   1. Acute on chronic systolic HF due to NICM-> cardiogenic shock - Echo EF < 20% - now on milrinone 0.125 mg and NE 8  - Need to get him off NE. Keep CVP 8-10. Continue midodrine at 10 tid. Will give another 500 cc NS 2. AKI -> ESRD - started on CRRT 04/23/19 - remains anuric  - keep CVP 8-10 - if unable to get him to tolerate iHD of pressors would be a palliative situation 3. Acute CVA with severe residual deficits due to extensive R cerebellar, midbrain, R MCA and L MCA infarcts  - likely cardioembolic - s/p clot extraction - neuro status continues to improve but remains quite weak bilaterally - no change today 4. Acute hypoxic respiratory failure - s/p trach  - on trach collar  5. Wide complex tachycardia/frequent PVCs/ventricular bigeminy - continues with frequent bigeminy - HR improved with ivabradine  - continue po amio  - Keep K >4.0, Mg >2.0  - no change 6. Leukocytosis - high risk for sepsis - remains AF - follow CBC - cultures redrawn 2/25 remain NGTD - ? C. Diff with diarrhea  CRITICAL CARE Performed by: Glori Bickers  Total critical care time: 35 minutes  Critical care time was exclusive of separately billable procedures and treating other patients.  Critical care was necessary to  treat or prevent imminent or life-threatening deterioration.  Critical care was time spent personally by me (independent of midlevel providers or  residents) on the following activities: development of treatment plan with patient and/or surrogate as well as nursing, discussions with consultants, evaluation of patient's response to treatment, examination of patient, obtaining history from patient or surrogate, ordering and performing treatments and interventions, ordering and review of laboratory studies, ordering and review of radiographic studies, pulse oximetry and re-evaluation of patient's condition.    Length of Stay: 22   Glori Bickers  MD 05/14/2019, 11:29 AM  Advanced Heart Failure Team Pager [phone] (M-F; [place])  Please contact [facility] for night-coverage after hours (4p -7a ) and weekends on [email or web address]

## 2019-05-14 NOTE — Progress Notes (Signed)
ANTICOAGULATION CONSULT NOTE  Pharmacy Consult:  Bivalirudin Indication: stroke 2/5, LV thrombus 2/8, HIT 2/15   Allergies  Allergen Reactions  . Heparin Other (See Comments)    Heparin induced thrombocytopenia. 2/15 HIT OD 1.692. 2/16 SRA positive-90.     Patient Measurements: Height: 6\' 1"  (185.4 cm) Weight: 239 lb 10.2 oz (108.7 kg) IBW/kg (Calculated) : 79.9    Vital Signs: Temp: 97.8 F (36.6 C) (02/27 0800) Temp Source: Axillary (02/27 0800) BP: 114/63 (02/27 0800) Pulse Rate: 76 (02/27 0817)  Labs: Recent Labs    05/12/19 0206 05/12/19 0207 05/13/19 1649 05/13/19 1923 05/13/19 2144 05/13/19 2205 05/13/19 2259 05/14/19 0428 05/14/19 0443 05/14/19 0443 05/14/19 0444 05/14/19 0639 05/14/19 0820  HGB 11.8*   < > 10.0*   < >   < >  --   --   --  11.2*   < > 10.9* 10.9*  --   HCT 38.0*   < > 32.6*   < >   < >  --   --   --  33.0*  --  32.0* 32.0*  --   PLT 625*  --  431*  --   --   --   --   --   --   --   --   --   --   APTT 46*   < >  --    < >  --   --  47* 55*  --   --   --   --  57*  CREATININE  --    < >  --    < >   < >   < >  --  3.13* 2.40*  --  3.30* 3.20*  --    < > = values in this interval not displayed.    Estimated Creatinine Clearance: 42.1 mL/min (A) (by C-G formula based on SCr of 3.2 mg/dL (H)).  Assessment: 34 yr old male with left MCA infarct - received TPA and revascularization with IR on 2/5. On 2/8 found to have small LV apical thrombus on ECHO. Pharmacy consulted to dose IV heparin, which was switched to bivalirudin 2/15 for HIT.    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 SRA very positive at 29. Heparin allergy has appropriately been added to chart and updated with SRA results.   APTT remains therapeutic on rate of 0.175 mg/kg/hr (36.5 mL/hr) with no issues with infusion. Weight adjusted in pump to match actual body weight of 104.4 kg (fluctuating some but this is most consistent weight therefore  order specific weight for drip). No bleeding reported. Hemoglobin is low but stable. Platelets are elevated on last check at 431 but trending back down to normal. Remains on CRRT - effluent rate ~25.    Goal of Therapy:  APTT 50- 65 sec  Monitor platelets by anticoagulation protocol: Yes   Plan:  Continue bivalrudin at 0.175 mg/kg/hr Daily aPTT and CBC  Sloan Leiter, PharmD, BCPS, BCCCP Clinical Pharmacist Clinical phone 05/14/2019 until 3P [phone] Please refer to [facility] for [place] numbers 05/14/2019, 8:53 AM

## 2019-05-14 NOTE — Progress Notes (Signed)
ANTICOAGULATION CONSULT NOTE Pharmacy Consult:  Bivalirudin Indication: stroke 2/5, LV thrombus 2/8, HIT 2/15   Allergies  Allergen Reactions  . Heparin Other (See Comments)    Heparin induced thrombocytopenia. 2/15 HIT OD 1.692.     Patient Measurements: Height: 6\' 1"  (185.4 cm) Weight: 230 lb 2.6 oz (104.4 kg) IBW/kg (Calculated) : 79.9    Vital Signs: Temp: 99.3 F (37.4 C) (02/26 2000) Temp Source: Oral (02/26 2000) BP: 125/59 (02/27 0000) Pulse Rate: 62 (02/27 0000)  Labs: Recent Labs    05/12/19 0206 05/12/19 0207 05/13/19 1100 05/13/19 1600 05/13/19 1649 05/13/19 1923 05/13/19 1924 05/13/19 1924 05/13/19 1929 05/13/19 2116 05/13/19 2144 05/13/19 2205 05/13/19 2259  HGB 11.8*   < >   < >  --  10.0*   < > 10.9*   < >  --  11.9* 10.5*  --   --   HCT 38.0*   < >   < >  --  32.6*   < > 32.0*  --   --  35.0* 31.0*  --   --   PLT 625*  --   --   --  431*  --   --   --   --   --   --   --   --   APTT 46*   < >  --  49*  --   --   --   --  46*  --   --   --  47*  CREATININE  --    < >  --  3.98*  --    < > 3.90*   < >  --  2.40* 3.60* 3.58*  --    < > = values in this interval not displayed.    Estimated Creatinine Clearance: 36.9 mL/min (A) (by C-G formula based on SCr of 3.58 mg/dL (H)).  Assessment: 34 yr old male with LV thrombus and HIT for bivalirudin  Goal of Therapy:  APTT 50- 65 sec  Monitor platelets by anticoagulation protocol: Yes   Plan:  Increase bivalrudin to 0.175 mg/kg/hr  F/U APTT in am  Phillis Knack, PharmD, BCPS  05/14/2019, 12:06 AM

## 2019-05-15 DIAGNOSIS — R570 Cardiogenic shock: Secondary | ICD-10-CM | POA: Diagnosis not present

## 2019-05-15 DIAGNOSIS — N170 Acute kidney failure with tubular necrosis: Secondary | ICD-10-CM | POA: Diagnosis not present

## 2019-05-15 DIAGNOSIS — I471 Supraventricular tachycardia: Secondary | ICD-10-CM | POA: Diagnosis not present

## 2019-05-15 DIAGNOSIS — I639 Cerebral infarction, unspecified: Secondary | ICD-10-CM | POA: Diagnosis not present

## 2019-05-15 LAB — POCT I-STAT, CHEM 8
BUN: 25 mg/dL — ABNORMAL HIGH (ref 6–20)
BUN: 26 mg/dL — ABNORMAL HIGH (ref 6–20)
BUN: 28 mg/dL — ABNORMAL HIGH (ref 6–20)
BUN: 28 mg/dL — ABNORMAL HIGH (ref 6–20)
BUN: 28 mg/dL — ABNORMAL HIGH (ref 6–20)
BUN: 28 mg/dL — ABNORMAL HIGH (ref 6–20)
BUN: 28 mg/dL — ABNORMAL HIGH (ref 6–20)
BUN: 28 mg/dL — ABNORMAL HIGH (ref 6–20)
BUN: 29 mg/dL — ABNORMAL HIGH (ref 6–20)
BUN: 31 mg/dL — ABNORMAL HIGH (ref 6–20)
BUN: 31 mg/dL — ABNORMAL HIGH (ref 6–20)
BUN: 31 mg/dL — ABNORMAL HIGH (ref 6–20)
BUN: 32 mg/dL — ABNORMAL HIGH (ref 6–20)
BUN: 33 mg/dL — ABNORMAL HIGH (ref 6–20)
BUN: 33 mg/dL — ABNORMAL HIGH (ref 6–20)
BUN: 34 mg/dL — ABNORMAL HIGH (ref 6–20)
BUN: 34 mg/dL — ABNORMAL HIGH (ref 6–20)
BUN: 35 mg/dL — ABNORMAL HIGH (ref 6–20)
BUN: 36 mg/dL — ABNORMAL HIGH (ref 6–20)
Calcium, Ion: 0.46 mmol/L — CL (ref 1.15–1.40)
Calcium, Ion: 0.46 mmol/L — CL (ref 1.15–1.40)
Calcium, Ion: 0.48 mmol/L — CL (ref 1.15–1.40)
Calcium, Ion: 0.48 mmol/L — CL (ref 1.15–1.40)
Calcium, Ion: 0.49 mmol/L — CL (ref 1.15–1.40)
Calcium, Ion: 0.5 mmol/L — CL (ref 1.15–1.40)
Calcium, Ion: 0.51 mmol/L — CL (ref 1.15–1.40)
Calcium, Ion: 0.55 mmol/L — CL (ref 1.15–1.40)
Calcium, Ion: 0.6 mmol/L — CL (ref 1.15–1.40)
Calcium, Ion: 0.94 mmol/L — ABNORMAL LOW (ref 1.15–1.40)
Calcium, Ion: 0.95 mmol/L — ABNORMAL LOW (ref 1.15–1.40)
Calcium, Ion: 0.98 mmol/L — ABNORMAL LOW (ref 1.15–1.40)
Calcium, Ion: 0.99 mmol/L — ABNORMAL LOW (ref 1.15–1.40)
Calcium, Ion: 1.01 mmol/L — ABNORMAL LOW (ref 1.15–1.40)
Calcium, Ion: 1.02 mmol/L — ABNORMAL LOW (ref 1.15–1.40)
Calcium, Ion: 1.02 mmol/L — ABNORMAL LOW (ref 1.15–1.40)
Calcium, Ion: 1.02 mmol/L — ABNORMAL LOW (ref 1.15–1.40)
Calcium, Ion: 1.04 mmol/L — ABNORMAL LOW (ref 1.15–1.40)
Calcium, Ion: 1.05 mmol/L — ABNORMAL LOW (ref 1.15–1.40)
Chloride: 100 mmol/L (ref 98–111)
Chloride: 104 mmol/L (ref 98–111)
Chloride: 95 mmol/L — ABNORMAL LOW (ref 98–111)
Chloride: 96 mmol/L — ABNORMAL LOW (ref 98–111)
Chloride: 96 mmol/L — ABNORMAL LOW (ref 98–111)
Chloride: 96 mmol/L — ABNORMAL LOW (ref 98–111)
Chloride: 96 mmol/L — ABNORMAL LOW (ref 98–111)
Chloride: 96 mmol/L — ABNORMAL LOW (ref 98–111)
Chloride: 97 mmol/L — ABNORMAL LOW (ref 98–111)
Chloride: 97 mmol/L — ABNORMAL LOW (ref 98–111)
Chloride: 98 mmol/L (ref 98–111)
Chloride: 98 mmol/L (ref 98–111)
Chloride: 98 mmol/L (ref 98–111)
Chloride: 98 mmol/L (ref 98–111)
Chloride: 98 mmol/L (ref 98–111)
Chloride: 98 mmol/L (ref 98–111)
Chloride: 99 mmol/L (ref 98–111)
Chloride: 99 mmol/L (ref 98–111)
Chloride: 99 mmol/L (ref 98–111)
Creatinine, Ser: 2 mg/dL — ABNORMAL HIGH (ref 0.61–1.24)
Creatinine, Ser: 2.1 mg/dL — ABNORMAL HIGH (ref 0.61–1.24)
Creatinine, Ser: 2.1 mg/dL — ABNORMAL HIGH (ref 0.61–1.24)
Creatinine, Ser: 2.1 mg/dL — ABNORMAL HIGH (ref 0.61–1.24)
Creatinine, Ser: 2.1 mg/dL — ABNORMAL HIGH (ref 0.61–1.24)
Creatinine, Ser: 2.1 mg/dL — ABNORMAL HIGH (ref 0.61–1.24)
Creatinine, Ser: 2.2 mg/dL — ABNORMAL HIGH (ref 0.61–1.24)
Creatinine, Ser: 2.4 mg/dL — ABNORMAL HIGH (ref 0.61–1.24)
Creatinine, Ser: 2.4 mg/dL — ABNORMAL HIGH (ref 0.61–1.24)
Creatinine, Ser: 2.7 mg/dL — ABNORMAL HIGH (ref 0.61–1.24)
Creatinine, Ser: 2.8 mg/dL — ABNORMAL HIGH (ref 0.61–1.24)
Creatinine, Ser: 2.8 mg/dL — ABNORMAL HIGH (ref 0.61–1.24)
Creatinine, Ser: 2.8 mg/dL — ABNORMAL HIGH (ref 0.61–1.24)
Creatinine, Ser: 2.8 mg/dL — ABNORMAL HIGH (ref 0.61–1.24)
Creatinine, Ser: 3 mg/dL — ABNORMAL HIGH (ref 0.61–1.24)
Creatinine, Ser: 3.1 mg/dL — ABNORMAL HIGH (ref 0.61–1.24)
Creatinine, Ser: 3.1 mg/dL — ABNORMAL HIGH (ref 0.61–1.24)
Creatinine, Ser: 3.1 mg/dL — ABNORMAL HIGH (ref 0.61–1.24)
Creatinine, Ser: 3.2 mg/dL — ABNORMAL HIGH (ref 0.61–1.24)
Glucose, Bld: 133 mg/dL — ABNORMAL HIGH (ref 70–99)
Glucose, Bld: 149 mg/dL — ABNORMAL HIGH (ref 70–99)
Glucose, Bld: 152 mg/dL — ABNORMAL HIGH (ref 70–99)
Glucose, Bld: 161 mg/dL — ABNORMAL HIGH (ref 70–99)
Glucose, Bld: 162 mg/dL — ABNORMAL HIGH (ref 70–99)
Glucose, Bld: 175 mg/dL — ABNORMAL HIGH (ref 70–99)
Glucose, Bld: 178 mg/dL — ABNORMAL HIGH (ref 70–99)
Glucose, Bld: 179 mg/dL — ABNORMAL HIGH (ref 70–99)
Glucose, Bld: 185 mg/dL — ABNORMAL HIGH (ref 70–99)
Glucose, Bld: 187 mg/dL — ABNORMAL HIGH (ref 70–99)
Glucose, Bld: 190 mg/dL — ABNORMAL HIGH (ref 70–99)
Glucose, Bld: 193 mg/dL — ABNORMAL HIGH (ref 70–99)
Glucose, Bld: 199 mg/dL — ABNORMAL HIGH (ref 70–99)
Glucose, Bld: 202 mg/dL — ABNORMAL HIGH (ref 70–99)
Glucose, Bld: 212 mg/dL — ABNORMAL HIGH (ref 70–99)
Glucose, Bld: 213 mg/dL — ABNORMAL HIGH (ref 70–99)
Glucose, Bld: 213 mg/dL — ABNORMAL HIGH (ref 70–99)
Glucose, Bld: 218 mg/dL — ABNORMAL HIGH (ref 70–99)
Glucose, Bld: 224 mg/dL — ABNORMAL HIGH (ref 70–99)
HCT: 26 % — ABNORMAL LOW (ref 39.0–52.0)
HCT: 26 % — ABNORMAL LOW (ref 39.0–52.0)
HCT: 27 % — ABNORMAL LOW (ref 39.0–52.0)
HCT: 28 % — ABNORMAL LOW (ref 39.0–52.0)
HCT: 28 % — ABNORMAL LOW (ref 39.0–52.0)
HCT: 28 % — ABNORMAL LOW (ref 39.0–52.0)
HCT: 29 % — ABNORMAL LOW (ref 39.0–52.0)
HCT: 29 % — ABNORMAL LOW (ref 39.0–52.0)
HCT: 30 % — ABNORMAL LOW (ref 39.0–52.0)
HCT: 31 % — ABNORMAL LOW (ref 39.0–52.0)
HCT: 31 % — ABNORMAL LOW (ref 39.0–52.0)
HCT: 31 % — ABNORMAL LOW (ref 39.0–52.0)
HCT: 32 % — ABNORMAL LOW (ref 39.0–52.0)
HCT: 32 % — ABNORMAL LOW (ref 39.0–52.0)
HCT: 32 % — ABNORMAL LOW (ref 39.0–52.0)
HCT: 32 % — ABNORMAL LOW (ref 39.0–52.0)
HCT: 33 % — ABNORMAL LOW (ref 39.0–52.0)
HCT: 33 % — ABNORMAL LOW (ref 39.0–52.0)
HCT: 34 % — ABNORMAL LOW (ref 39.0–52.0)
Hemoglobin: 10.2 g/dL — ABNORMAL LOW (ref 13.0–17.0)
Hemoglobin: 10.5 g/dL — ABNORMAL LOW (ref 13.0–17.0)
Hemoglobin: 10.5 g/dL — ABNORMAL LOW (ref 13.0–17.0)
Hemoglobin: 10.5 g/dL — ABNORMAL LOW (ref 13.0–17.0)
Hemoglobin: 10.9 g/dL — ABNORMAL LOW (ref 13.0–17.0)
Hemoglobin: 10.9 g/dL — ABNORMAL LOW (ref 13.0–17.0)
Hemoglobin: 10.9 g/dL — ABNORMAL LOW (ref 13.0–17.0)
Hemoglobin: 10.9 g/dL — ABNORMAL LOW (ref 13.0–17.0)
Hemoglobin: 11.2 g/dL — ABNORMAL LOW (ref 13.0–17.0)
Hemoglobin: 11.2 g/dL — ABNORMAL LOW (ref 13.0–17.0)
Hemoglobin: 11.6 g/dL — ABNORMAL LOW (ref 13.0–17.0)
Hemoglobin: 8.8 g/dL — ABNORMAL LOW (ref 13.0–17.0)
Hemoglobin: 8.8 g/dL — ABNORMAL LOW (ref 13.0–17.0)
Hemoglobin: 9.2 g/dL — ABNORMAL LOW (ref 13.0–17.0)
Hemoglobin: 9.5 g/dL — ABNORMAL LOW (ref 13.0–17.0)
Hemoglobin: 9.5 g/dL — ABNORMAL LOW (ref 13.0–17.0)
Hemoglobin: 9.5 g/dL — ABNORMAL LOW (ref 13.0–17.0)
Hemoglobin: 9.9 g/dL — ABNORMAL LOW (ref 13.0–17.0)
Hemoglobin: 9.9 g/dL — ABNORMAL LOW (ref 13.0–17.0)
Potassium: 3.2 mmol/L — ABNORMAL LOW (ref 3.5–5.1)
Potassium: 3.2 mmol/L — ABNORMAL LOW (ref 3.5–5.1)
Potassium: 3.4 mmol/L — ABNORMAL LOW (ref 3.5–5.1)
Potassium: 3.4 mmol/L — ABNORMAL LOW (ref 3.5–5.1)
Potassium: 3.4 mmol/L — ABNORMAL LOW (ref 3.5–5.1)
Potassium: 3.5 mmol/L (ref 3.5–5.1)
Potassium: 3.5 mmol/L (ref 3.5–5.1)
Potassium: 3.5 mmol/L (ref 3.5–5.1)
Potassium: 3.5 mmol/L (ref 3.5–5.1)
Potassium: 3.5 mmol/L (ref 3.5–5.1)
Potassium: 3.5 mmol/L (ref 3.5–5.1)
Potassium: 3.5 mmol/L (ref 3.5–5.1)
Potassium: 3.5 mmol/L (ref 3.5–5.1)
Potassium: 3.5 mmol/L (ref 3.5–5.1)
Potassium: 3.6 mmol/L (ref 3.5–5.1)
Potassium: 3.7 mmol/L (ref 3.5–5.1)
Potassium: 3.7 mmol/L (ref 3.5–5.1)
Potassium: 3.8 mmol/L (ref 3.5–5.1)
Potassium: 3.8 mmol/L (ref 3.5–5.1)
Sodium: 139 mmol/L (ref 135–145)
Sodium: 139 mmol/L (ref 135–145)
Sodium: 140 mmol/L (ref 135–145)
Sodium: 140 mmol/L (ref 135–145)
Sodium: 140 mmol/L (ref 135–145)
Sodium: 140 mmol/L (ref 135–145)
Sodium: 140 mmol/L (ref 135–145)
Sodium: 140 mmol/L (ref 135–145)
Sodium: 140 mmol/L (ref 135–145)
Sodium: 140 mmol/L (ref 135–145)
Sodium: 140 mmol/L (ref 135–145)
Sodium: 141 mmol/L (ref 135–145)
Sodium: 141 mmol/L (ref 135–145)
Sodium: 141 mmol/L (ref 135–145)
Sodium: 141 mmol/L (ref 135–145)
Sodium: 141 mmol/L (ref 135–145)
Sodium: 141 mmol/L (ref 135–145)
Sodium: 141 mmol/L (ref 135–145)
Sodium: 141 mmol/L (ref 135–145)
TCO2: 26 mmol/L (ref 22–32)
TCO2: 27 mmol/L (ref 22–32)
TCO2: 28 mmol/L (ref 22–32)
TCO2: 28 mmol/L (ref 22–32)
TCO2: 28 mmol/L (ref 22–32)
TCO2: 28 mmol/L (ref 22–32)
TCO2: 28 mmol/L (ref 22–32)
TCO2: 28 mmol/L (ref 22–32)
TCO2: 29 mmol/L (ref 22–32)
TCO2: 29 mmol/L (ref 22–32)
TCO2: 29 mmol/L (ref 22–32)
TCO2: 30 mmol/L (ref 22–32)
TCO2: 30 mmol/L (ref 22–32)
TCO2: 30 mmol/L (ref 22–32)
TCO2: 30 mmol/L (ref 22–32)
TCO2: 30 mmol/L (ref 22–32)
TCO2: 31 mmol/L (ref 22–32)
TCO2: 32 mmol/L (ref 22–32)
TCO2: 32 mmol/L (ref 22–32)

## 2019-05-15 LAB — GLUCOSE, CAPILLARY
Glucose-Capillary: 152 mg/dL — ABNORMAL HIGH (ref 70–99)
Glucose-Capillary: 172 mg/dL — ABNORMAL HIGH (ref 70–99)
Glucose-Capillary: 174 mg/dL — ABNORMAL HIGH (ref 70–99)
Glucose-Capillary: 184 mg/dL — ABNORMAL HIGH (ref 70–99)
Glucose-Capillary: 195 mg/dL — ABNORMAL HIGH (ref 70–99)
Glucose-Capillary: 222 mg/dL — ABNORMAL HIGH (ref 70–99)

## 2019-05-15 LAB — CBC
HCT: 29.8 % — ABNORMAL LOW (ref 39.0–52.0)
Hemoglobin: 9.2 g/dL — ABNORMAL LOW (ref 13.0–17.0)
MCH: 31 pg (ref 26.0–34.0)
MCHC: 30.9 g/dL (ref 30.0–36.0)
MCV: 100.3 fL — ABNORMAL HIGH (ref 80.0–100.0)
Platelets: 354 10*3/uL (ref 150–400)
RBC: 2.97 MIL/uL — ABNORMAL LOW (ref 4.22–5.81)
RDW: 14.6 % (ref 11.5–15.5)
WBC: 10.2 10*3/uL (ref 4.0–10.5)
nRBC: 0 % (ref 0.0–0.2)

## 2019-05-15 LAB — RENAL FUNCTION PANEL
Albumin: 2.7 g/dL — ABNORMAL LOW (ref 3.5–5.0)
Albumin: 2.6 g/dL — ABNORMAL LOW (ref 3.5–5.0)
Anion gap: 15 (ref 5–15)
Anion gap: 14 (ref 5–15)
BUN: 32 mg/dL — ABNORMAL HIGH (ref 6–20)
BUN: 32 mg/dL — ABNORMAL HIGH (ref 6–20)
CO2: 28 mmol/L (ref 22–32)
CO2: 27 mmol/L (ref 22–32)
Calcium: 8.5 mg/dL — ABNORMAL LOW (ref 8.9–10.3)
Calcium: 8.5 mg/dL — ABNORMAL LOW (ref 8.9–10.3)
Chloride: 96 mmol/L — ABNORMAL LOW (ref 98–111)
Chloride: 98 mmol/L (ref 98–111)
Creatinine, Ser: 2.59 mg/dL — ABNORMAL HIGH (ref 0.61–1.24)
Creatinine, Ser: 3.02 mg/dL — ABNORMAL HIGH (ref 0.61–1.24)
GFR calc Af Amer: 36 mL/min — ABNORMAL LOW (ref >60)
GFR calc Af Amer: 30 mL/min — ABNORMAL LOW (ref >60)
GFR calc non Af Amer: 31 mL/min — ABNORMAL LOW (ref >60)
GFR calc non Af Amer: 26 mL/min — ABNORMAL LOW (ref >60)
Glucose, Bld: 205 mg/dL — ABNORMAL HIGH (ref 70–99)
Glucose, Bld: 195 mg/dL — ABNORMAL HIGH (ref 70–99)
Phosphorus: 3.7 mg/dL (ref 2.5–4.6)
Phosphorus: 4 mg/dL (ref 2.5–4.6)
Potassium: 3.2 mmol/L — ABNORMAL LOW (ref 3.5–5.1)
Potassium: 3.6 mmol/L (ref 3.5–5.1)
Sodium: 139 mmol/L (ref 135–145)
Sodium: 139 mmol/L (ref 135–145)

## 2019-05-15 LAB — COOXEMETRY PANEL
Carboxyhemoglobin: 2.6 % — ABNORMAL HIGH (ref 0.5–1.5)
Carboxyhemoglobin: 2.4 % — ABNORMAL HIGH (ref 0.5–1.5)
Methemoglobin: 1 % (ref 0.0–1.5)
Methemoglobin: 0.9 % (ref 0.0–1.5)
O2 Saturation: 95.9 %
O2 Saturation: 65.4 %
Total hemoglobin: 9.6 g/dL — ABNORMAL LOW (ref 12.0–16.0)
Total hemoglobin: 9 g/dL — ABNORMAL LOW (ref 12.0–16.0)

## 2019-05-15 LAB — CALCIUM, IONIZED
Calcium, Ionized, Serum: 4.4 mg/dL — ABNORMAL LOW (ref 4.5–5.6)
Calcium, Ionized, Serum: 4.1 mg/dL — ABNORMAL LOW (ref 4.5–5.6)

## 2019-05-15 LAB — MAGNESIUM: Magnesium: 1.9 mg/dL (ref 1.7–2.4)

## 2019-05-15 LAB — APTT: aPTT: 55 seconds — ABNORMAL HIGH (ref 24–36)

## 2019-05-15 MED ORDER — ALTEPLASE 2 MG IJ SOLR
2.0000 mg | Freq: Once | INTRAMUSCULAR | Status: AC
  Administered 2019-05-15: 2 mg

## 2019-05-15 MED ORDER — AMIODARONE HCL IN DEXTROSE 360-4.14 MG/200ML-% IV SOLN
30.0000 mg/h | INTRAVENOUS | Status: DC
  Administered 2019-05-15 – 2019-05-18 (×5): 30 mg/h via INTRAVENOUS
  Filled 2019-05-15 (×6): qty 200

## 2019-05-15 MED ORDER — MIDODRINE HCL 5 MG PO TABS
15.0000 mg | ORAL_TABLET | Freq: Three times a day (TID) | ORAL | Status: DC
  Administered 2019-05-15 – 2019-06-03 (×56): 15 mg
  Filled 2019-05-15 (×56): qty 3

## 2019-05-15 MED ORDER — AMIODARONE LOAD VIA INFUSION
150.0000 mg | Freq: Once | INTRAVENOUS | Status: AC
  Administered 2019-05-15: 150 mg via INTRAVENOUS
  Filled 2019-05-15: qty 83.34

## 2019-05-15 MED ORDER — POTASSIUM CHLORIDE 20 MEQ/15ML (10%) PO SOLN
40.0000 meq | Freq: Once | ORAL | Status: AC
  Administered 2019-05-15: 40 meq via ORAL
  Filled 2019-05-15: qty 30

## 2019-05-15 MED ORDER — SODIUM CHLORIDE 0.9 % IV BOLUS
500.0000 mL | Freq: Once | INTRAVENOUS | Status: AC
  Administered 2019-05-15: 500 mL via INTRAVENOUS

## 2019-05-15 MED ORDER — AMIODARONE HCL IN DEXTROSE 360-4.14 MG/200ML-% IV SOLN
60.0000 mg/h | INTRAVENOUS | Status: AC
  Administered 2019-05-15 (×2): 60 mg/h via INTRAVENOUS
  Filled 2019-05-15: qty 200

## 2019-05-15 NOTE — Progress Notes (Signed)
Assisted tele visit to patient with family member.  Myra Weng M, RN   

## 2019-05-15 NOTE — Progress Notes (Signed)
Nephrology Progress Note:  Patient ID: Jabe Jeanbaptiste., male   DOB: Oct 19, 1985, 34 y.o.   MRN: [id number]   S:  On milrinone. Levo 10.  Had 2 liters UF over 2/27 with CRRT.  Seen on CRRT. right fem nontunneled cath with blood flow 230; procedure supervised.  CVP 5.  Has been on keep even.   Review of systems: unable to obtain 2/2 pt nonverbal   O:BP 122/61   Pulse (!) 36   Temp 97.9 F (36.6 C) (Oral)   Resp 18   Ht _0  (1.854 m)   Wt 109.6 kg   SpO2 100%   BMI 31.88 kg/m   Intake/Output Summary (Last 24 hours) at 05/15/2019 0743 Last data filed at 05/15/2019 0700 Gross per 24 hour  Intake 4833.93 ml  Output 2601 ml  Net 2232.93 ml   Intake/Output: I/O last 3 completed shifts: In: 7207 [I.V.:3804.9; Other:242.5; NG/GT:2052; IV Piggyback:1107.5] Out: 3267 [TIWPY:0998; Stool:950]    Intake/Output this shift:  No intake/output data recorded. Weight change: 0.9 kg  Physical exam:  Gen: awake in bed, trach collar, nonverbal. following commands (I.e. to blink hard) HEENT - trach in place CVS: S1S2 no rub Resp: reduced breath sounds   Abd: soft/ND Ext: he has no edema Access: right femoral nontunneled catheter  Recent Labs  Lab 05/09/19 0500 05/09/19 0631 05/12/19 0207 05/12/19 1408 05/12/19 1700 05/12/19 1805 05/13/19 0503 05/13/19 0623 05/13/19 1600 05/13/19 1923 05/13/19 2205 05/13/19 2205 05/14/19 0428 05/14/19 0443 05/14/19 1510 05/14/19 1510 05/14/19 2144 05/14/19 2150 05/15/19 0110 05/15/19 0234 05/15/19 0250 05/15/19 0435 05/15/19 0441 05/15/19 0445 05/15/19 0626  NA 137   < > 138   < > 138   < > 137   < > 140   < > 140   < > 140   < > 139   < > 139   < > 141 141 140 139 139 141 141  K 3.9   < > 4.2   < > 3.9   < > 3.7   < > 3.9   < > 3.6   < > 3.5   < > 3.5   < > 3.3*   < > 3.4* 3.8 3.5 3.2* 3.2* 3.4* 3.5  CL 92*   < > 98   < > 98   < > 95*   < > 103   < > 103   < > 99   < > 97*   < > 98   < > 98 95* 96* 96* 96* 96* 96*  CO2 26   < >  22  --  22   < > 23  --  22  --  23  --  25  --  27  --  27  --   --   --   --  28  --   --   --   GLUCOSE 286*   < > 226*   < > 285*   < > 248*   < > 223*   < > 117*   < > 163*   < > 139*   < > 151*   < > 178* 202* 162* 205* 199* 224* 175*  BUN 39*   < > 71*   < > 81*   < > 65*   < > 61*   < > 48*   < > 44*   < > 39*   < > 38*   < >  35* 29* 34* 32* 31* 26* 25*  CREATININE 3.32*   < > 4.89*   < > 5.80*   < > 4.32*   < > 3.98*   < > 3.58*   < > 3.13*   < > 2.95*   < > 2.95*   < > 2.80* 2.10* 2.80* 2.59* 2.80* 2.00* 2.10*  ALBUMIN 2.6*   < > 2.9*  --  2.8*  --  2.8*  --  2.6*  --   --   --  2.6*  --  2.6*  --   --   --   --   --   --  2.7*  --   --   --   CALCIUM 9.7   < > 10.0  --  9.7   < > 9.6  --  9.0  --  8.7*  --  9.0  --  9.7  --  9.1  --   --   --   --  8.5*  --   --   --   PHOS 3.9   < > 6.2*  --  6.1*  --  5.8*  --  5.2*  --   --   --  4.3  --  4.3  --   --   --   --   --   --  3.7  --   --   --   AST 44*  --   --   --   --   --   --   --   --   --   --   --   --   --   --   --   --   --   --   --   --   --   --   --   --   ALT 78*  --   --   --   --   --   --   --   --   --   --   --   --   --   --   --   --   --   --   --   --   --   --   --   --    < > = values in this interval not displayed.   Liver Function Tests: Recent Labs  Lab 05/09/19 0500 05/09/19 1655 05/14/19 0428 05/14/19 1510 05/15/19 0435  AST 44*  --   --   --   --   ALT 78*  --   --   --   --   ALKPHOS 164*  --   --   --   --   BILITOT 1.4*  --   --   --   --   PROT 8.2*  --   --   --   --   ALBUMIN 2.6*   < > 2.6* 2.6* 2.7*   < > = values in this interval not displayed.   CBC: Recent Labs  Lab 05/09/19 0500 05/09/19 0631 05/12/19 0206 05/12/19 1408 05/13/19 1649 05/13/19 1923 05/15/19 0441 05/15/19 0445 05/15/19 0626  WBC 11.1*  --  21.4*  --  14.4*  --   --   --   --   HGB 9.9*   < > 11.8*   < > 10.0*   < > 10.2* 10.5* 10.5*  HCT 32.3*   < > 38.0*   < >  32.6*   < > 30.0* 31.0* 31.0*  MCV 98.5  --   99.2  --  100.9*  --   --   --   --   PLT 419*  --  625*  --  431*  --   --   --   --    < > = values in this interval not displayed.    Studies/Results: No results found. . sodium chloride   Intravenous Once  . amiodarone  200 mg Per Tube BID  . aspirin  81 mg Per Tube Daily  . atorvastatin  40 mg Per Tube q1800  . chlorhexidine gluconate (MEDLINE KIT)  15 mL Mouth Rinse BID  . Chlorhexidine Gluconate Cloth  6 each Topical Q0600  . feeding supplement (PRO-STAT SUGAR FREE 64)  60 mL Per Tube TID  . FLUoxetine  20 mg Per Tube Daily  . insulin aspart  0-20 Units Subcutaneous Q4H  . insulin aspart  10 Units Subcutaneous Q4H  . insulin detemir  35 Units Subcutaneous BID  . ivabradine  5 mg Per Tube BID WC  . mouth rinse  15 mL Mouth Rinse 10 times per day  . midodrine  10 mg Per Tube TID WC  . pantoprazole sodium  40 mg Per Tube Q1200  . potassium chloride  40 mEq Oral Once  . QUEtiapine  25 mg Per Tube QHS  . sodium chloride flush  10-40 mL Intracatheter Q12H  . sodium chloride flush  3 mL Intravenous Q12H    BMET    Component Value Date/Time   NA 141 05/15/2019 0626   K 3.5 05/15/2019 0626   CL 96 (L) 05/15/2019 0626   CO2 28 05/15/2019 0435   GLUCOSE 175 (H) 05/15/2019 0626   BUN 25 (H) 05/15/2019 0626   CREATININE 2.10 (H) 05/15/2019 0626   CALCIUM 8.5 (L) 05/15/2019 0435   GFRNONAA 31 (L) 05/15/2019 0435   GFRAA 36 (L) 05/15/2019 0435   CBC    Component Value Date/Time   WBC 14.4 (H) 05/13/2019 1649   RBC 3.23 (L) 05/13/2019 1649   HGB 10.5 (L) 05/15/2019 0626   HCT 31.0 (L) 05/15/2019 0626   PLT 431 (H) 05/13/2019 1649   MCV 100.9 (H) 05/13/2019 1649   MCH 31.0 05/13/2019 1649   MCHC 30.7 05/13/2019 1649   RDW 14.7 05/13/2019 1649   LYMPHSABS 0.8 04/24/2019 0843   MONOABS 0.5 04/24/2019 0843   EOSABS 0.4 04/24/2019 0843   BASOSABS 0.0 04/24/2019 0843   Assessment/Plan:  1. AKIpresumably due to ATN with ischemic/nephrotoxic insults worsened by  cardiogenic shock. Started on CRRT 04/23/19. Resumed CVVHD 05/06/19 due to hyperkalemia which has resolved. After trial of CRRT without citrate with clotting x multiple in short time, CRRT was initially held overnight.  Then resumed on 2/25 with citrate for clearance.  1. Continue CRRT.  Increased dialysate to 2.0 liters/hr on 2/26 and asked today again to increase BF.  Better clearance on this regimen. 2. NS bolus once 500 ml over 2 hours  3. Then keep Positive 100/hr x 6 hours then return to keep even until further instructions from nephro 4. Discussed with nursing to tpa access again now 2 hours  5. Using 4K/0Ca baths for all fluids due to use of citrate.systemic and post-filter calcium being monitored per protocol 2. Cardiogenic shock-  Hx milrinone due to development of a fib with rvr and wide complex tachycardia.   1. Give normal saline bolus 500 mL do not  remove with CRRT and UF goals as above  - Please concentrate all inputs as able  - Discussed with Dr. Haroldine Laws with advanced CHF who felt that he has a known severe NICM with EF <20% and did not feel that he would tolerate IHD but would continue to support with milrinone and midodrine/pressors as needed to help promote renal perfusion.  3. Leukocytosis  - improving last check. abx per primary team.  Note femoral catheter.  Blood cultures sent 2/25 - listed as NGTD though low volume.  CBC ordered 2/28 4. Acute stroke- presumed embolic with bilateral infarcts and LV thrombus- received tpa on admission 5. SVT/polymorphic VT- per Cardiology 6. Acute hypoxic respiratory failure- s/p trach  7. 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 HIT Ab+. 8. Disposition- overall prognosis is poor with ongoing RRT (has not tolerated stopping CVVHD due to hypercatabolic state and concern for poor tolerance of intermittent HD), trach, as well as cardiogenic shock/cardiomyopathy.  Claudia Desanctis, MD 05/15/2019 8:05 AM

## 2019-05-15 NOTE — Progress Notes (Signed)
NAME:  Carlos Brewer., MRN:  [id number], DOB:  [birth year], LOS: 23 ADMISSION DATE:  05/12/2019, CONSULTATION DATE:  05/09/2019 REFERRING MD:  Dr. Leonel Ramsay, CHIEF COMPLAINT:  Slurred speech  Brief History   34 yo male smoker found to have slurred speech and Rt sided weakness.  Admitted with left MCA CVA and multiple smaller embolic infarcts s/p tPA and thrombectomy in IR.  Additionally found to have left ventricular atrial thrombus.    Course complicated by septic shock r/t HCAP, AKI requiring CRRT, polymorphic VT and cardiogenic shock.   Past Medical History  Systolic CHF with non ischemic CM, Cocaine abuse, OSA, DM Hx of CHF (EF 15%)  Hx myocarditis Smoker 1/2 ppd   Significant Hospital Events   2/05 Admit, tPA, IR thrombectomy 2/6 100% on fvent at 2300  Overnight requiring increasing levophed and phenylephrine.    2/6: switched to levophed, epi, started antibiotics, started on CRRT.  2/9  developed wide-complex tachycardia and hypotension, started on amiodarone drip, increase Levophed drip 2/15 drop in platelets, heparin stopped, bival started 2/17 Hypotensive and back on Levophed 2/18 trach, lines changed 2/19 started milrinone , fever 103 2/20 Episode of wide-complex tachycardia yesterday, changed from Levophed to vasopressin 2/22 stopping vanc. Still on inotrope support. Some AF w/ RVR. Had to be placed back on pressors. ivabradine added 2/23 still on pressors/ CRRT continued 2/24 tmax 98.4, Remains on CRRT, even UF, remains anuric, Levophed at 14 mcg/min, Milrinone at 0.125 mcg/kg/min, Coox 63.4 Doing well this morning on ATC, no events overnight.  Midodrine added   2/25 more interactive, remains on ATC, tmax 99.5/ WBC 21.4, ~900 ml liquid stool/ 24 hours, neg Cdiff, Levophed up to 20 mcg/min, CVP 2, ,milrinone remains at 0.125 mcg/kg/min, coox 92.7, Off CRRT since last night ~2000 s/p clotted x 3 off citrate, restarted   Consults:  Neuro IR Cardiology   Nephrology Heart Failure  EP   Procedures:  2/5-2/6:  TPA given at 428 am (total of 90 Mg) 520 am went to IR  S/P Lt common carotid arteriogram followed by complete revascularization of occluded LT MCA sup division mid M2 seg with x 1 pass with 52mx 40 mm solitaire X ret river device and penumbra aspiration with TICI 3 revascularization  ETT 2/05 >> 2/18 [facility] CVL 2/5 >>2/18  RT [facility] CVL 2/18 >> RIJ HD cath 2/6 >>2/18 6 shiley cuffed trach 2/18  >>  RT fem HD 2/19 >>  Significant Diagnostic Tests:  CT angio head/neck 2/05 >> occlusion of Lt MCA bifurcation Echo 2/05 >> EF less than 20%, cannot rule out apical thrombus MRI brain 2/11 > extensive acute infarction of multiple areas without large or medium vessel occlusion Echocardiogram 2/8 Left ventricular ejection fraction, by estimation, is <20%. The left  ventricle has severely decreased function. The left ventricle demonstrates  global hypokinesis. The left ventricular internal cavity size was severely  dilated. Possible small 0.8 x  0.6 cm apical thrombus. Somewhat subtle finding, could confirm with  cardiac MRI. However, if patient has had CVA, would be reasonable to  anticoagulate given severity of LV dysfunction if no contraindications to  anticoagulation.   Micro Data:  SARS CoV2 PCR 2/05 >> negative Influenza PCR 2/05 >> negative Urine 2/6 >> ng resp 2/6 >> nml flora BC 2/6 >>ng resp 2/16 >> [facility] >> negative  BC x 2 2/25 >> Cdiff 2/25 >> neg  Antimicrobials:  mero 2/6 -2/12 Zosyn 2/6 , 2/19 >> 2/23 vanc 2/6 >>  2/9 , 2/19 >>2/22  Interim history/subjective:  RN reports no acute event overnight, patient remains alert and interactive this morning. Remains on milrinone and CRRT  Objective   Blood pressure (!) 121/48, pulse 83, temperature 97.9 F (36.6 C), temperature source Axillary, resp. rate (!) 21, height 6' 1"  (1.854 m), weight 109.6 kg, SpO2 99 %. CVP:  [0 mmHg-12 mmHg] 7 mmHg  FiO2 (%):  [28 %]  28 %   Intake/Output Summary (Last 24 hours) at 05/15/2019 1111 Last data filed at 05/15/2019 0700 Gross per 24 hour  Intake 4202.33 ml  Output 2311 ml  Net 1891.33 ml   Filed Weights   05/13/19 0500 05/14/19 0500 05/15/19 0500  Weight: 104.4 kg 108.7 kg 109.6 kg    Examination: General: Adult African-American male on his last check, lying in bed in no acute distress HEENT: Louise/AT trach midline, MM pink/moist, PERRL,  Neuro: Alert and interactive, able to follow simple commands  CV: s1s2 regular rate and rhythm, no murmur, rubs, or gallops,  PULM:  Clear to ascultation bilaterally, no increased work of breathing, no added breath sounds  GI: soft, bowel sounds active in all 4 quadrants, non-tender, non-distended, tolerating TF Extremities: warm/dry, no  edema  Skin: no rashes or lesions   Resolved Hospital Problem list     Assessment & Plan:   Left MCA CVA Multiple other infarcts noted on MRI 8/40-[id number] embolic Acute metabolic encephalopathy P: Supportive care  PT/OT efforts as able  Continue nightly [place]  Acute systolic HF with cardiogenic shock in setting of NICM, ejection fraction is less than 20% P: Heart failure team following, appreciate assistance Follow Coox Monitor CVP Continue Milrinone and Levophed  Trend CVP ideally daily goal of 4-6 K goal >4, Mg goal > 2  Left ventricle apical thrombus non ischemic cardiomyopathy with systolic congestive heart failure Polymorphic VT on 2/9, WCT 2/19 HLD P: Continuous telemetry  Primary management per cardiology  Continue PO Amiodarone and Ivabradine  Daily ASA and Lipitor   Leukocytosis, improving - treated for HCAP 2/19- 2/23 zosyn/ vanc Plan: Remains afebrile  Monitor closely  Trend CBC and fever curve   Acute hypoxemic respiratory failure now tracheostomy dependent after stroke - last on MV 2/21 Plan Continue aggressive pulmonary hygiene  Trach care  Remain on ATC SLP following  Can consider  cuffless trach in the near future   Acute kidney injury. Still anuric  - CRRT started 2/6 Plan Remains net negative Continue on CRRT  Nephrology following  Bladder scan as needed  Follow renal panel and mg  Thrombocytopenia - HIT + (2/15) - platelets recovered -SRA positive at 90 Plan Avoid penicillin  Continue Bivalirdin  Trend CBC   DM type II poorly controlled Plan Improved control  Continue current regiment  SSI   Hx of cocaine abuse. Depression. - not active per UDS  Plan Continue Prozac   Best practice:  Diet: NPO,  TFs DVT prophylaxis: SCDs/ bivalirudin GI prophylaxis: protonix Mobility: progress when able Code Status: full code Disposition: ICU  Family -pending    Performed by: Johnsie Cancel   Total critical care time: 36 minutes  Critical care time was exclusive of separately billable procedures and treating other patients.  Critical care was necessary to treat or prevent imminent or life-threatening deterioration.  Critical care was time spent personally by me on the following activities: development of treatment plan with patient and/or surrogate as well as nursing, discussions with consultants, evaluation of patient's response to treatment,  examination of patient, obtaining history from patient or surrogate, ordering and performing treatments and interventions, ordering and review of laboratory studies, ordering and review of radiographic studies, pulse oximetry and re-evaluation of patient's condition.   Johnsie Cancel, NP-C [facility] Contact / Pager information can be found on Amion  05/15/2019, 11:11 AM

## 2019-05-15 NOTE — Progress Notes (Signed)
Advanced Heart Failure Rounding Note   Subjective:    Less alert today. Remains on trach collar.   Given more IVF yesterday for CVP 2. On milrinone 0.125 and NE was off for a bit yesterday. Developed atrial tach/flutter last night and was put back on NE with low BP. Now back up to NE 10.   Back in NSR with ventricular bigeminy. Given 500cc more IVF by Renal today. CVP 5. Remains on CVVHD for UF. Anuric  WBC up to 21K -> 14.4 -> 10.2 . BCx 2/25 remain NGTD  AF   Objective:   Weight Range:  Vital Signs:   Temp:  [97.7 F (36.5 C)-99.3 F (37.4 C)] 97.9 F (36.6 C) (02/28 0800) Pulse Rate:  [32-135] 83 (02/28 1000) Resp:  [14-24] 21 (02/28 1000) BP: (84-127)/(39-78) 121/48 (02/28 1000) SpO2:  [97 %-100 %] 99 % (02/28 1000) FiO2 (%):  [28 %] 28 % (02/28 0805) Weight:  [109.6 kg] 109.6 kg (02/28 0500) Last BM Date: 05/15/19  Weight change: Filed Weights   05/13/19 0500 05/14/19 0500 05/15/19 0500  Weight: 104.4 kg 108.7 kg 109.6 kg    Intake/Output:   Intake/Output Summary (Last 24 hours) at 05/15/2019 1049 Last data filed at 05/15/2019 0700 Gross per 24 hour  Intake 4310.68 ml  Output 2511 ml  Net 1799.68 ml     Physical Exam: General:  On TC. Less alert but arousable  HEENT: normal Neck: supple. no JVD. + trach Carotids 2+ bilat; no bruits. No lymphadenopathy or thryomegaly appreciated. Cor: PMI nondisplaced. Irregular rate & rhythm. s3 Lungs: clear Abdomen: obese soft, nontender, nondistended. No hepatosplenomegaly. No bruits or masses. Good bowel sounds. Extremities: no cyanosis, clubbing, rash, edema RFV trialysis Neuro:  Awake on trach. Less alert but arousable  Telemetry:  Sinus 70-80s with frequent PVCs in/out bigeminy several hour run of atrial tach/flutter overnight Personally reviewed    Labs: Basic Metabolic Panel: Recent Labs  Lab 05/12/19 0206 05/12/19 0207 05/13/19 0503 05/13/19 6269 05/13/19 1600 05/13/19 1923 05/13/19 2205  05/13/19 2205 05/14/19 0428 05/14/19 0443 05/14/19 1510 05/14/19 1510 05/14/19 2144 05/14/19 2150 05/15/19 0250 05/15/19 0435 05/15/19 0441 05/15/19 0445 05/15/19 0626  NA  --    < > 137   < > 140   < > 140   < > 140   < > 139   < > 139   < > 140 139 139 141 141  K  --    < > 3.7   < > 3.9   < > 3.6   < > 3.5   < > 3.5   < > 3.3*   < > 3.5 3.2* 3.2* 3.4* 3.5  CL  --    < > 95*   < > 103   < > 103   < > 99   < > 97*   < > 98   < > 96* 96* 96* 96* 96*  CO2  --    < > 23   < > 22   < > 23  --  25  --  27  --  27  --   --  28  --   --   --   GLUCOSE  --    < > 248*   < > 223*   < > 117*   < > 163*   < > 139*   < > 151*   < > 162* 205* 199* 224* 175*  BUN  --    < >  65*   < > 61*   < > 48*   < > 44*   < > 39*   < > 38*   < > 34* 32* 31* 26* 25*  CREATININE  --    < > 4.32*   < > 3.98*   < > 3.58*   < > 3.13*   < > 2.95*   < > 2.95*   < > 2.80* 2.59* 2.80* 2.00* 2.10*  CALCIUM  --    < > 9.6   < > 9.0   < > 8.7*   < > 9.0   < > 9.7  --  9.1  --   --  8.5*  --   --   --   MG 2.6*  --  2.3  --   --   --   --   --  2.2  --   --   --  2.0  --   --  1.9  --   --   --   PHOS  --    < > 5.8*  --  5.2*  --   --   --  4.3  --  4.3  --   --   --   --  3.7  --   --   --    < > = values in this interval not displayed.    Liver Function Tests: Recent Labs  Lab 05/09/19 0500 05/09/19 1655 05/13/19 0503 05/13/19 1600 05/14/19 0428 05/14/19 1510 05/15/19 0435  AST 44*  --   --   --   --   --   --   ALT 78*  --   --   --   --   --   --   ALKPHOS 164*  --   --   --   --   --   --   BILITOT 1.4*  --   --   --   --   --   --   PROT 8.2*  --   --   --   --   --   --   ALBUMIN 2.6*   < > 2.8* 2.6* 2.6* 2.6* 2.7*   < > = values in this interval not displayed.   No results for input(s): LIPASE, AMYLASE in the last 168 hours. No results for input(s): AMMONIA in the last 168 hours.  CBC: Recent Labs  Lab 05/09/19 0500 05/09/19 0631 05/12/19 0206 05/12/19 1408 05/13/19 1649 05/13/19 1923  05/15/19 0250 05/15/19 0441 05/15/19 0445 05/15/19 0626 05/15/19 0900  WBC 11.1*  --  21.4*  --  14.4*  --   --   --   --   --  10.2  HGB 9.9*   < > 11.8*   < > 10.0*   < > 9.9* 10.2* 10.5* 10.5* 9.2*  HCT 32.3*   < > 38.0*   < > 32.6*   < > 29.0* 30.0* 31.0* 31.0* 29.8*  MCV 98.5  --  99.2  --  100.9*  --   --   --   --   --  100.3*  PLT 419*  --  625*  --  431*  --   --   --   --   --  354   < > = values in this interval not displayed.    Cardiac Enzymes: No results for input(s): CKTOTAL, CKMB, CKMBINDEX, TROPONINI in the last 168 hours.  BNP: BNP (  last 3 results) Recent Labs    12/13/18 1750 12/15/18 0405 04/23/19 0945  BNP 1,156.1* 1,161.0* 2,909.9*    ProBNP (last 3 results) No results for input(s): PROBNP in the last 8760 hours.    Other results:  Imaging: No results found.   Medications:     Scheduled Medications: . sodium chloride   Intravenous Once  . amiodarone  200 mg Per Tube BID  . aspirin  81 mg Per Tube Daily  . atorvastatin  40 mg Per Tube q1800  . chlorhexidine gluconate (MEDLINE KIT)  15 mL Mouth Rinse BID  . Chlorhexidine Gluconate Cloth  6 each Topical Q0600  . feeding supplement (PRO-STAT SUGAR FREE 64)  60 mL Per Tube TID  . FLUoxetine  20 mg Per Tube Daily  . insulin aspart  0-20 Units Subcutaneous Q4H  . insulin aspart  10 Units Subcutaneous Q4H  . insulin detemir  35 Units Subcutaneous BID  . ivabradine  5 mg Per Tube BID WC  . mouth rinse  15 mL Mouth Rinse 10 times per day  . midodrine  10 mg Per Tube TID WC  . pantoprazole sodium  40 mg Per Tube Q1200  . QUEtiapine  25 mg Per Tube QHS  . sodium chloride flush  10-40 mL Intracatheter Q12H  . sodium chloride flush  3 mL Intravenous Q12H    Infusions: . sodium chloride Stopped (05/04/19 0200)  . sodium chloride    . anticoagulant sodium citrate    . bivalirudin (ANGIOMAX) infusion 0.5 mg/mL (Non-ACS indications) 0.175 mg/kg/hr (05/15/19 1034)  . calcium gluconate infusion for  CRRT 20 g (05/15/19 0651)  . feeding supplement (VITAL AF 1.2 CAL) 60 mL/hr at 05/14/19 1600  . milrinone 0.125 mcg/kg/min (05/15/19 0700)  . norepinephrine (LEVOPHED) Adult infusion 12 mcg/min (05/15/19 0700)  . prismasol B22GK 4/0 300 mL/hr at 05/15/19 0629  . prismasol B22GK 4/0 2,000 mL/hr at 05/15/19 0530  . sodium citrate 2 %/dextrose 2.5% solution 3000 mL 320 mL/hr at 05/15/19 0037    PRN Medications: sodium chloride, sodium chloride, anticoagulant sodium citrate, bisacodyl, docusate, fentaNYL (SUBLIMAZE) injection, sodium chloride flush, sodium chloride flush   Assessment/Plan:   1. Acute on chronic systolic HF due to NICM-> cardiogenic shock - Echo EF < 20% - now on milrinone 0.125 mg and NE 8 -> 10 - Need to get him off NE and see if he can tolerate iHD. Will try to allow CVP to drift up to 8-10 range as respiratory status tolerates. Increase midodrine at 15 tid.  2. AKI -> ESRD - started on CRRT 04/23/19 - remains anuric  - let CVP climb as above - if unable to get him to tolerate iHD of pressors would be a palliative situation 3. Acute CVA with severe residual deficits due to extensive R cerebellar, midbrain, R MCA and L MCA infarcts  - likely cardioembolic - s/p clot extraction - neuro status continues to improve but remains quite weak bilaterally - no change 4. Acute hypoxic respiratory failure - s/p trach  - on trach collar  - stable 5. Wide complex tachycardia/frequent PVCs/ventricular bigeminy - continues with frequent bigeminy - had recurrent atrial tach/flutter yesterday - HR improved with ivabradine  - will start IV amio  - Keep K >4.0, Mg >2.0  - no change. No b-blocker with shock 6. Leukocytosis - high risk for sepsis - remains AF - follow CBC. WBC falling - cultures redrawn 2/25 remain NGTD   CRITICAL CARE Performed by: Glori Bickers  Total critical care time: 35 minutes  Critical care time was exclusive of separately billable procedures  and treating other patients.  Critical care was necessary to treat or prevent imminent or life-threatening deterioration.  Critical care was time spent personally by me (independent of midlevel providers or residents) on the following activities: development of treatment plan with patient and/or surrogate as well as nursing, discussions with consultants, evaluation of patient's response to treatment, examination of patient, obtaining history from patient or surrogate, ordering and performing treatments and interventions, ordering and review of laboratory studies, ordering and review of radiographic studies, pulse oximetry and re-evaluation of patient's condition.    Length of Stay: 23   Glori Bickers  MD 05/15/2019, 10:49 AM  Advanced Heart Failure Team Pager [phone] (M-F; [place])  Please contact [facility] for night-coverage after hours (4p -7a ) and weekends on [email or web address]

## 2019-05-15 NOTE — Progress Notes (Signed)
Assisted tele visit to patient with family member.  Weslee Fogg M, RN  

## 2019-05-15 NOTE — Progress Notes (Signed)
[facility] RN Jody assisted tele visit to patient with wife.  Rosser Collington M, RN

## 2019-05-15 NOTE — Progress Notes (Signed)
ANTICOAGULATION CONSULT NOTE  Pharmacy Consult:  Bivalirudin Indication: stroke 2/5, LV thrombus 2/8, HIT 2/15   Allergies  Allergen Reactions  . Heparin Other (See Comments)    Heparin induced thrombocytopenia. 2/15 HIT OD 1.692. 2/16 SRA positive-90.     Patient Measurements: Height: 6\' 1"  (185.4 cm) Weight: 241 lb 10 oz (109.6 kg) IBW/kg (Calculated) : 79.9    Vital Signs: Temp: 97.9 F (36.6 C) (02/28 0400) Temp Source: Oral (02/28 0400) BP: 122/61 (02/28 0700) Pulse Rate: 36 (02/28 0700)  Labs: Recent Labs    05/13/19 1649 05/13/19 1923 05/14/19 0428 05/14/19 0443 05/14/19 0820 05/14/19 1446 05/15/19 0250 05/15/19 0435 05/15/19 0441 05/15/19 0441 05/15/19 0445 05/15/19 0626  HGB 10.0*   < >  --    < >  --    < >   < >  --  10.2*   < > 10.5* 10.5*  HCT 32.6*   < >  --    < >  --    < >   < >  --  30.0*  --  31.0* 31.0*  PLT 431*  --   --   --   --   --   --   --   --   --   --   --   APTT  --    < > 55*  --  57*  --   --  55*  --   --   --   --   CREATININE  --    < > 3.13*   < >  --    < >   < > 2.59* 2.80*  --  2.00* 2.10*   < > = values in this interval not displayed.    Estimated Creatinine Clearance: 64.4 mL/min (A) (by C-G formula based on SCr of 2.1 mg/dL (H)).  Assessment: 34 yr old male with left MCA infarct - received TPA and revascularization with IR on 2/5. On 2/8 found to have small LV apical thrombus on ECHO. Pharmacy consulted to dose IV heparin, which was switched to bivalirudin 2/15 for HIT.    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 SRA very positive at 22. Heparin allergy has appropriately been added to chart and updated with SRA results.   APTT remains therapeutic on rate of 0.175 mg/kg/hr (36.5 mL/hr) with no issues with infusion. Weight adjusted in pump to match actual body weight of 104.4 kg (fluctuating some but this is most consistent weight therefore order specific weight for drip). No bleeding  reported. Hemoglobin is low but stable. Platelets are elevated on last check at 431 but trending back down to normal. Remains on CRRT.   Goal of Therapy:  APTT 50- 65 sec  Monitor platelets by anticoagulation protocol: Yes   Plan:  Continue bivalrudin at 0.175 mg/kg/hr Daily aPTT and CBC  Sloan Leiter, PharmD, BCPS, BCCCP Clinical Pharmacist Clinical phone 05/15/2019 until 3P [phone] Please refer to [facility] for [place] numbers 05/15/2019, 7:08 AM

## 2019-05-16 DIAGNOSIS — J9600 Acute respiratory failure, unspecified whether with hypoxia or hypercapnia: Secondary | ICD-10-CM | POA: Diagnosis not present

## 2019-05-16 DIAGNOSIS — N179 Acute kidney failure, unspecified: Secondary | ICD-10-CM | POA: Diagnosis not present

## 2019-05-16 DIAGNOSIS — R570 Cardiogenic shock: Secondary | ICD-10-CM | POA: Diagnosis not present

## 2019-05-16 DIAGNOSIS — Z930 Tracheostomy status: Secondary | ICD-10-CM | POA: Diagnosis not present

## 2019-05-16 DIAGNOSIS — N170 Acute kidney failure with tubular necrosis: Secondary | ICD-10-CM | POA: Diagnosis not present

## 2019-05-16 LAB — POCT I-STAT, CHEM 8
BUN: 27 mg/dL — ABNORMAL HIGH (ref 6–20)
BUN: 33 mg/dL — ABNORMAL HIGH (ref 6–20)
BUN: 26 mg/dL — ABNORMAL HIGH (ref 6–20)
BUN: 31 mg/dL — ABNORMAL HIGH (ref 6–20)
BUN: 25 mg/dL — ABNORMAL HIGH (ref 6–20)
BUN: 31 mg/dL — ABNORMAL HIGH (ref 6–20)
BUN: 24 mg/dL — ABNORMAL HIGH (ref 6–20)
BUN: 24 mg/dL — ABNORMAL HIGH (ref 6–20)
BUN: 24 mg/dL — ABNORMAL HIGH (ref 6–20)
BUN: 22 mg/dL — ABNORMAL HIGH (ref 6–20)
BUN: 24 mg/dL — ABNORMAL HIGH (ref 6–20)
BUN: 27 mg/dL — ABNORMAL HIGH (ref 6–20)
BUN: 23 mg/dL — ABNORMAL HIGH (ref 6–20)
BUN: 27 mg/dL — ABNORMAL HIGH (ref 6–20)
Calcium, Ion: 0.5 mmol/L — CL (ref 1.15–1.40)
Calcium, Ion: 0.94 mmol/L — ABNORMAL LOW (ref 1.15–1.40)
Calcium, Ion: 0.63 mmol/L — CL (ref 1.15–1.40)
Calcium, Ion: 1.08 mmol/L — ABNORMAL LOW (ref 1.15–1.40)
Calcium, Ion: 0.59 mmol/L — CL (ref 1.15–1.40)
Calcium, Ion: 1.01 mmol/L — ABNORMAL LOW (ref 1.15–1.40)
Calcium, Ion: 0.57 mmol/L — CL (ref 1.15–1.40)
Calcium, Ion: 0.55 mmol/L — CL (ref 1.15–1.40)
Calcium, Ion: 0.54 mmol/L — CL (ref 1.15–1.40)
Calcium, Ion: 0.48 mmol/L — CL (ref 1.15–1.40)
Calcium, Ion: 0.53 mmol/L — CL (ref 1.15–1.40)
Calcium, Ion: 1.01 mmol/L — ABNORMAL LOW (ref 1.15–1.40)
Calcium, Ion: 0.55 mmol/L — CL (ref 1.15–1.40)
Calcium, Ion: 1.03 mmol/L — ABNORMAL LOW (ref 1.15–1.40)
Chloride: 96 mmol/L — ABNORMAL LOW (ref 98–111)
Chloride: 96 mmol/L — ABNORMAL LOW (ref 98–111)
Chloride: 95 mmol/L — ABNORMAL LOW (ref 98–111)
Chloride: 96 mmol/L — ABNORMAL LOW (ref 98–111)
Chloride: 96 mmol/L — ABNORMAL LOW (ref 98–111)
Chloride: 97 mmol/L — ABNORMAL LOW (ref 98–111)
Chloride: 95 mmol/L — ABNORMAL LOW (ref 98–111)
Chloride: 95 mmol/L — ABNORMAL LOW (ref 98–111)
Chloride: 95 mmol/L — ABNORMAL LOW (ref 98–111)
Chloride: 95 mmol/L — ABNORMAL LOW (ref 98–111)
Chloride: 95 mmol/L — ABNORMAL LOW (ref 98–111)
Chloride: 96 mmol/L — ABNORMAL LOW (ref 98–111)
Chloride: 93 mmol/L — ABNORMAL LOW (ref 98–111)
Chloride: 94 mmol/L — ABNORMAL LOW (ref 98–111)
Creatinine, Ser: 2.1 mg/dL — ABNORMAL HIGH (ref 0.61–1.24)
Creatinine, Ser: 2.6 mg/dL — ABNORMAL HIGH (ref 0.61–1.24)
Creatinine, Ser: 2.1 mg/dL — ABNORMAL HIGH (ref 0.61–1.24)
Creatinine, Ser: 2.6 mg/dL — ABNORMAL HIGH (ref 0.61–1.24)
Creatinine, Ser: 2.1 mg/dL — ABNORMAL HIGH (ref 0.61–1.24)
Creatinine, Ser: 2.6 mg/dL — ABNORMAL HIGH (ref 0.61–1.24)
Creatinine, Ser: 2 mg/dL — ABNORMAL HIGH (ref 0.61–1.24)
Creatinine, Ser: 1.9 mg/dL — ABNORMAL HIGH (ref 0.61–1.24)
Creatinine, Ser: 1.9 mg/dL — ABNORMAL HIGH (ref 0.61–1.24)
Creatinine, Ser: 1.9 mg/dL — ABNORMAL HIGH (ref 0.61–1.24)
Creatinine, Ser: 1.9 mg/dL — ABNORMAL HIGH (ref 0.61–1.24)
Creatinine, Ser: 2.3 mg/dL — ABNORMAL HIGH (ref 0.61–1.24)
Creatinine, Ser: 1.9 mg/dL — ABNORMAL HIGH (ref 0.61–1.24)
Creatinine, Ser: 2.4 mg/dL — ABNORMAL HIGH (ref 0.61–1.24)
Glucose, Bld: 221 mg/dL — ABNORMAL HIGH (ref 70–99)
Glucose, Bld: 190 mg/dL — ABNORMAL HIGH (ref 70–99)
Glucose, Bld: 159 mg/dL — ABNORMAL HIGH (ref 70–99)
Glucose, Bld: 178 mg/dL — ABNORMAL HIGH (ref 70–99)
Glucose, Bld: 172 mg/dL — ABNORMAL HIGH (ref 70–99)
Glucose, Bld: 204 mg/dL — ABNORMAL HIGH (ref 70–99)
Glucose, Bld: 208 mg/dL — ABNORMAL HIGH (ref 70–99)
Glucose, Bld: 226 mg/dL — ABNORMAL HIGH (ref 70–99)
Glucose, Bld: 208 mg/dL — ABNORMAL HIGH (ref 70–99)
Glucose, Bld: 203 mg/dL — ABNORMAL HIGH (ref 70–99)
Glucose, Bld: 152 mg/dL — ABNORMAL HIGH (ref 70–99)
Glucose, Bld: 194 mg/dL — ABNORMAL HIGH (ref 70–99)
Glucose, Bld: 135 mg/dL — ABNORMAL HIGH (ref 70–99)
Glucose, Bld: 178 mg/dL — ABNORMAL HIGH (ref 70–99)
HCT: 29 % — ABNORMAL LOW (ref 39.0–52.0)
HCT: 29 % — ABNORMAL LOW (ref 39.0–52.0)
HCT: 27 % — ABNORMAL LOW (ref 39.0–52.0)
HCT: 29 % — ABNORMAL LOW (ref 39.0–52.0)
HCT: 27 % — ABNORMAL LOW (ref 39.0–52.0)
HCT: 31 % — ABNORMAL LOW (ref 39.0–52.0)
HCT: 30 % — ABNORMAL LOW (ref 39.0–52.0)
HCT: 30 % — ABNORMAL LOW (ref 39.0–52.0)
HCT: 30 % — ABNORMAL LOW (ref 39.0–52.0)
HCT: 30 % — ABNORMAL LOW (ref 39.0–52.0)
HCT: 28 % — ABNORMAL LOW (ref 39.0–52.0)
HCT: 29 % — ABNORMAL LOW (ref 39.0–52.0)
HCT: 28 % — ABNORMAL LOW (ref 39.0–52.0)
HCT: 29 % — ABNORMAL LOW (ref 39.0–52.0)
Hemoglobin: 9.9 g/dL — ABNORMAL LOW (ref 13.0–17.0)
Hemoglobin: 9.9 g/dL — ABNORMAL LOW (ref 13.0–17.0)
Hemoglobin: 9.2 g/dL — ABNORMAL LOW (ref 13.0–17.0)
Hemoglobin: 9.9 g/dL — ABNORMAL LOW (ref 13.0–17.0)
Hemoglobin: 10.5 g/dL — ABNORMAL LOW (ref 13.0–17.0)
Hemoglobin: 9.2 g/dL — ABNORMAL LOW (ref 13.0–17.0)
Hemoglobin: 10.2 g/dL — ABNORMAL LOW (ref 13.0–17.0)
Hemoglobin: 10.2 g/dL — ABNORMAL LOW (ref 13.0–17.0)
Hemoglobin: 10.2 g/dL — ABNORMAL LOW (ref 13.0–17.0)
Hemoglobin: 10.2 g/dL — ABNORMAL LOW (ref 13.0–17.0)
Hemoglobin: 9.5 g/dL — ABNORMAL LOW (ref 13.0–17.0)
Hemoglobin: 9.9 g/dL — ABNORMAL LOW (ref 13.0–17.0)
Hemoglobin: 9.5 g/dL — ABNORMAL LOW (ref 13.0–17.0)
Hemoglobin: 9.9 g/dL — ABNORMAL LOW (ref 13.0–17.0)
Potassium: 3.4 mmol/L — ABNORMAL LOW (ref 3.5–5.1)
Potassium: 3.3 mmol/L — ABNORMAL LOW (ref 3.5–5.1)
Potassium: 3.3 mmol/L — ABNORMAL LOW (ref 3.5–5.1)
Potassium: 3.3 mmol/L — ABNORMAL LOW (ref 3.5–5.1)
Potassium: 3.4 mmol/L — ABNORMAL LOW (ref 3.5–5.1)
Potassium: 3.6 mmol/L (ref 3.5–5.1)
Potassium: 3.6 mmol/L (ref 3.5–5.1)
Potassium: 3.6 mmol/L (ref 3.5–5.1)
Potassium: 3.6 mmol/L (ref 3.5–5.1)
Potassium: 3.4 mmol/L — ABNORMAL LOW (ref 3.5–5.1)
Potassium: 3.3 mmol/L — ABNORMAL LOW (ref 3.5–5.1)
Potassium: 3.6 mmol/L (ref 3.5–5.1)
Potassium: 3.3 mmol/L — ABNORMAL LOW (ref 3.5–5.1)
Potassium: 3.3 mmol/L — ABNORMAL LOW (ref 3.5–5.1)
Sodium: 140 mmol/L (ref 135–145)
Sodium: 139 mmol/L (ref 135–145)
Sodium: 140 mmol/L (ref 135–145)
Sodium: 140 mmol/L (ref 135–145)
Sodium: 139 mmol/L (ref 135–145)
Sodium: 140 mmol/L (ref 135–145)
Sodium: 139 mmol/L (ref 135–145)
Sodium: 140 mmol/L (ref 135–145)
Sodium: 140 mmol/L (ref 135–145)
Sodium: 139 mmol/L (ref 135–145)
Sodium: 139 mmol/L (ref 135–145)
Sodium: 140 mmol/L (ref 135–145)
Sodium: 139 mmol/L (ref 135–145)
Sodium: 139 mmol/L (ref 135–145)
TCO2: 29 mmol/L (ref 22–32)
TCO2: 31 mmol/L (ref 22–32)
TCO2: 29 mmol/L (ref 22–32)
TCO2: 31 mmol/L (ref 22–32)
TCO2: 29 mmol/L (ref 22–32)
TCO2: 30 mmol/L (ref 22–32)
TCO2: 28 mmol/L (ref 22–32)
TCO2: 29 mmol/L (ref 22–32)
TCO2: 29 mmol/L (ref 22–32)
TCO2: 29 mmol/L (ref 22–32)
TCO2: 30 mmol/L (ref 22–32)
TCO2: 32 mmol/L (ref 22–32)
TCO2: 30 mmol/L (ref 22–32)
TCO2: 32 mmol/L (ref 22–32)

## 2019-05-16 LAB — RENAL FUNCTION PANEL
Albumin: 2.6 g/dL — ABNORMAL LOW (ref 3.5–5.0)
Albumin: 2.6 g/dL — ABNORMAL LOW (ref 3.5–5.0)
Anion gap: 14 (ref 5–15)
Anion gap: 14 (ref 5–15)
BUN: 31 mg/dL — ABNORMAL HIGH (ref 6–20)
BUN: 25 mg/dL — ABNORMAL HIGH (ref 6–20)
CO2: 29 mmol/L (ref 22–32)
CO2: 29 mmol/L (ref 22–32)
Calcium: 8.7 mg/dL — ABNORMAL LOW (ref 8.9–10.3)
Calcium: 8.8 mg/dL — ABNORMAL LOW (ref 8.9–10.3)
Chloride: 97 mmol/L — ABNORMAL LOW (ref 98–111)
Chloride: 96 mmol/L — ABNORMAL LOW (ref 98–111)
Creatinine, Ser: 2.61 mg/dL — ABNORMAL HIGH (ref 0.61–1.24)
Creatinine, Ser: 2.33 mg/dL — ABNORMAL HIGH (ref 0.61–1.24)
GFR calc Af Amer: 36 mL/min — ABNORMAL LOW (ref >60)
GFR calc Af Amer: 41 mL/min — ABNORMAL LOW (ref >60)
GFR calc non Af Amer: 31 mL/min — ABNORMAL LOW (ref >60)
GFR calc non Af Amer: 35 mL/min — ABNORMAL LOW (ref >60)
Glucose, Bld: 157 mg/dL — ABNORMAL HIGH (ref 70–99)
Glucose, Bld: 166 mg/dL — ABNORMAL HIGH (ref 70–99)
Phosphorus: 3.8 mg/dL (ref 2.5–4.6)
Phosphorus: 3.6 mg/dL (ref 2.5–4.6)
Potassium: 3.2 mmol/L — ABNORMAL LOW (ref 3.5–5.1)
Potassium: 3.3 mmol/L — ABNORMAL LOW (ref 3.5–5.1)
Sodium: 140 mmol/L (ref 135–145)
Sodium: 139 mmol/L (ref 135–145)

## 2019-05-16 LAB — CBC
HCT: 29.1 % — ABNORMAL LOW (ref 39.0–52.0)
Hemoglobin: 9 g/dL — ABNORMAL LOW (ref 13.0–17.0)
MCH: 31.3 pg (ref 26.0–34.0)
MCHC: 30.9 g/dL (ref 30.0–36.0)
MCV: 101 fL — ABNORMAL HIGH (ref 80.0–100.0)
Platelets: 294 10*3/uL (ref 150–400)
RBC: 2.88 MIL/uL — ABNORMAL LOW (ref 4.22–5.81)
RDW: 14.5 % (ref 11.5–15.5)
WBC: 10 10*3/uL (ref 4.0–10.5)
nRBC: 0 % (ref 0.0–0.2)

## 2019-05-16 LAB — GLUCOSE, CAPILLARY
Glucose-Capillary: 153 mg/dL — ABNORMAL HIGH (ref 70–99)
Glucose-Capillary: 162 mg/dL — ABNORMAL HIGH (ref 70–99)
Glucose-Capillary: 173 mg/dL — ABNORMAL HIGH (ref 70–99)
Glucose-Capillary: 183 mg/dL — ABNORMAL HIGH (ref 70–99)
Glucose-Capillary: 187 mg/dL — ABNORMAL HIGH (ref 70–99)
Glucose-Capillary: 204 mg/dL — ABNORMAL HIGH (ref 70–99)

## 2019-05-16 LAB — COOXEMETRY PANEL
Carboxyhemoglobin: 2.1 % — ABNORMAL HIGH (ref 0.5–1.5)
Carboxyhemoglobin: 1.6 % — ABNORMAL HIGH (ref 0.5–1.5)
Methemoglobin: 0.4 % (ref 0.0–1.5)
Methemoglobin: 1 % (ref 0.0–1.5)
O2 Saturation: 90.9 %
O2 Saturation: 48.4 %
Total hemoglobin: 8.9 g/dL — ABNORMAL LOW (ref 12.0–16.0)
Total hemoglobin: 9.2 g/dL — ABNORMAL LOW (ref 12.0–16.0)

## 2019-05-16 LAB — APTT: aPTT: 60 seconds — ABNORMAL HIGH (ref 24–36)

## 2019-05-16 LAB — CALCIUM, IONIZED: Calcium, Ionized, Serum: 4.3 mg/dL — ABNORMAL LOW (ref 4.5–5.6)

## 2019-05-16 LAB — MAGNESIUM: Magnesium: 1.9 mg/dL (ref 1.7–2.4)

## 2019-05-16 MED ORDER — CEFAZOLIN SODIUM-DEXTROSE 2-4 GM/100ML-% IV SOLN
2.0000 g | INTRAVENOUS | Status: AC
  Administered 2019-05-17: 2 g via INTRAVENOUS
  Filled 2019-05-16 (×2): qty 100

## 2019-05-16 MED ORDER — SODIUM CHLORIDE 0.9 % IV BOLUS
500.0000 mL | Freq: Once | INTRAVENOUS | Status: AC
  Administered 2019-05-16: 500 mL via INTRAVENOUS

## 2019-05-16 MED ORDER — MAGNESIUM SULFATE 2 GM/50ML IV SOLN
2.0000 g | Freq: Once | INTRAVENOUS | Status: AC
  Administered 2019-05-16: 2 g via INTRAVENOUS
  Filled 2019-05-16: qty 50

## 2019-05-16 NOTE — Progress Notes (Addendum)
Nutrition Follow-up  DOCUMENTATION CODES:   Obesity unspecified  INTERVENTION:   Continue:  Vital 1.2 @ 60 ml/hr via Cortrak tube 60 ml Prostat TID  Provides: 2328 kcal, 198 grams protein  NUTRITION DIAGNOSIS:   Inadequate oral intake related to inability to eat as evidenced by NPO status. Ongoing.   GOAL:   Patient will meet greater than or equal to 90% of their needs Meeting with TF.   MONITOR:   Vent status  REASON FOR ASSESSMENT:   Ventilator    ASSESSMENT:   Pt with PMH of CHF, OSA, poorly controlled DM, cocaine abuse admitted with L MCA stroke s/p tPA and IR thrombectomy.     Pt tolerating TC, per MD may change femoral HD cath to allow pt to work with therapies.  Followed by Heart Failure team due to EF <20%. Pt on milrinone, ivabradine, and amio due to atrial tach.   2/6 started on CRRT  2/18 s/p trach placement 2/19 Cortrak placement  Medications reviewed and include:  10 units novolog every 4 hours, 35 units levemir BID  milrinone Levo @ 8 mcg Mag sulfate x 1  Labs reviewed:  CBGs: 183-187   I&O:+21 L last 24 hrs  CRRT: 4489 ml   Vital 1.5 @ 50 ml with 60 ml Prostat TID Provides: 2040 kcal, 180 grams protein  Diet Order:   Diet Order            Diet NPO time specified  Diet effective now              EDUCATION NEEDS:   No education needs have been identified at this time  Skin:  Skin Assessment: Skin Integrity Issues: Skin Integrity Issues:: Stage II, Other (Comment) Stage II: R buttocks, penis Other: open wound buttocks  Last BM:  425 ml via rectal tube  Height:   Ht Readings from Last 1 Encounters:  05/14/2019 6\' 1"  (1.854 m)    Weight:   Wt Readings from Last 1 Encounters:  05/16/19 110.4 kg    Ideal Body Weight:  83.6 kg  BMI:  Body mass index is 32.11 kg/m.  Estimated Nutritional Needs:   Kcal:  2300-2500  Protein:  167 -209 grams  Fluid:  2 L/day  Lockie Pares., RD, LDN, CNSC See [facility] for contact  information

## 2019-05-16 NOTE — Progress Notes (Signed)
Assisted tele visit to patient with family member.  Jovahn Breit P, RN  

## 2019-05-16 NOTE — Progress Notes (Signed)
[facility] Progress Note     Assessment/ Plan:   1. AKIpresumably due to ATN with ischemic/nephrotoxic insults worsened by cardiogenic shock. Started on CRRT 04/23/19. Resumed CVVHD2/19/21 due to hyperkalemiawhich has resolved. After trial of CRRT without citrate with clotting x multiple in short time, CRRT was initially held overnight.  Then resumed on 2/25 with citrate for clearance.  1. Continue CRRT.  Increased dialysate to 2.0 liters/hr on 2/26 and asked today again to increase BF.  Better clearance on this regimen. 2. Using 4K/0Ca baths for dialysate and post filter fluids due to use of citrate.systemic and post-filter calcium being monitored per protocol 3. Requested VIR to place [facility] TC; restart angiomax for now if confirm cath can't be placed till tomorrow. 4. Plan on CRRT holiday tomorrow and possibly Wednesday -> then challenge with iHD. 2. Cardiogenic shock-  Hx milrinone due to development of a fib with rvr and wide complex tachycardia. - Known severe NICM with EF <20% and may not tolerate but also can't continue CRRT indefinitely. - If he doesn't tolerate iHD will need meeting with mother to define goals of care.   3. Leukocytosis  - improving last check. abx per primary team.  Note femoral catheter.  Blood cultures sent 2/25 - listed as NGTD though low volume.  CBC ordered 2/28 4. Acute stroke- presumed embolic with bilateral infarcts and LV thrombus- received tpa on admission 5. SVT/polymorphic VT- per Cardiology 6. Acute hypoxic respiratory failure- s/p trach  7. 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 HIT Ab+. 8. Disposition- overall prognosis is poor with ongoing RRT (has not tolerated stopping CVVHD due to hypercatabolic state and concern for poor tolerance of intermittent HD), trach, as well as cardiogenic shock/cardiomyopathy.  Subjective:   Continues to be on milrinone, levo and midodrine.  Rt fem non tunneled vasc cath.  CVP5   Objective:    BP 115/65   Pulse 81   Temp 98.2 F (36.8 C) (Oral)   Resp 20   Ht '6\' 1"'$  (1.854 m)   Wt 110.4 kg   SpO2 100%   BMI 32.11 kg/m   Intake/Output Summary (Last 24 hours) at 05/16/2019 1343 Last data filed at 05/16/2019 1300 Gross per 24 hour  Intake 4219.85 ml  Output 3185 ml  Net 1034.85 ml   Weight change: 0.8 kg  Physical Exam: Gen: awake in bed, trach collar, nonverbal. following commands (I.e. to blink hard, squeezing hand, moving extremities) HEENT - trach in place CVS: S1S2 no rub Resp: reduced breath sounds   Abd: soft/ND Ext: he has no edema Access: right femoral nontunneled catheter  Imaging: No results found.  Labs: BMET Recent Labs  Lab 05/13/19 0503 05/13/19 1245 05/13/19 1600 05/13/19 1923 05/13/19 2205 05/13/19 2205 05/14/19 0428 05/14/19 0443 05/14/19 1510 05/14/19 1510 05/14/19 2144 05/14/19 2150 05/15/19 0435 05/15/19 0441 05/15/19 1650 05/15/19 1713 05/15/19 2334 05/16/19 0128 05/16/19 0132 05/16/19 0544 05/16/19 0552 05/16/19 0751 05/16/19 0753  NA 137   < > 140   < > 140   < > 140   < > 139   < > 139   < > 139   < > 139   < > 140 140 139 140  140 140 140 139  K 3.7   < > 3.9   < > 3.6   < > 3.5   < > 3.5   < > 3.3*   < > 3.2*   < > 3.6   < > 3.5  3.4* 3.3* 3.2*  3.3* 3.3* 3.6 3.4*  CL 95*   < > 103   < > 103   < > 99   < > 97*   < > 98   < > 96*   < > 98   < > 97* 96* 96* 97*  96* 95* 96* 97*  CO2 23   < > 22   < > 23  --  25  --  27  --  27  --  28  --  27  --   --   --   --  29  --   --   --   GLUCOSE 248*   < > 223*   < > 117*   < > 163*   < > 139*   < > 151*   < > 205*   < > 195*   < > 193* 221* 190* 157*  178* 159* 204* 172*  BUN 65*   < > 61*   < > 48*   < > 44*   < > 39*   < > 38*   < > 32*   < > 32*   < > 33* 27* 33* 31*  26* 31* 25* 31*  CREATININE 4.32*   < > 3.98*   < > 3.58*   < > 3.13*   < > 2.95*   < > 2.95*   < > 2.59*   < > 3.02*   < > 2.70* 2.10* 2.60* 2.61*  2.10* 2.60* 2.10* 2.60*  CALCIUM 9.6   < > 9.0   < >  8.7*  --  9.0  --  9.7  --  9.1  --  8.5*  --  8.5*  --   --   --   --  8.7*  --   --   --   PHOS 5.8*  --  5.2*  --   --   --  4.3  --  4.3  --   --   --  3.7  --  4.0  --   --   --   --  3.8  --   --   --    < > = values in this interval not displayed.   CBC Recent Labs  Lab 05/12/19 0206 05/12/19 1408 05/13/19 1649 05/13/19 1923 05/15/19 0900 05/15/19 1519 05/16/19 0552 05/16/19 0751 05/16/19 0753 05/16/19 1233  WBC 21.4*  --  14.4*  --  10.2  --   --   --   --  10.0  HGB 11.8*   < > 10.0*   < > 9.2*   < > 9.2* 10.5* 9.2* 9.0*  HCT 38.0*   < > 32.6*   < > 29.8*   < > 27.0* 31.0* 27.0* 29.1*  MCV 99.2  --  100.9*  --  100.3*  --   --   --   --  101.0*  PLT 625*  --  431*  --  354  --   --   --   --  294   < > = values in this interval not displayed.    Medications:    . sodium chloride   Intravenous Once  . aspirin  81 mg Per Tube Daily  . atorvastatin  40 mg Per Tube q1800  . chlorhexidine gluconate (MEDLINE KIT)  15 mL Mouth Rinse BID  . Chlorhexidine Gluconate Cloth  6 each Topical Q0600  . feeding  supplement (PRO-STAT SUGAR FREE 64)  60 mL Per Tube TID  . FLUoxetine  20 mg Per Tube Daily  . insulin aspart  0-20 Units Subcutaneous Q4H  . insulin aspart  10 Units Subcutaneous Q4H  . insulin detemir  35 Units Subcutaneous BID  . mouth rinse  15 mL Mouth Rinse 10 times per day  . midodrine  15 mg Per Tube TID WC  . pantoprazole sodium  40 mg Per Tube Q1200  . QUEtiapine  25 mg Per Tube QHS  . sodium chloride flush  10-40 mL Intracatheter Q12H  . sodium chloride flush  3 mL Intravenous Q12H      Otelia Santee, MD 05/16/2019, 1:43 PM

## 2019-05-16 NOTE — Progress Notes (Signed)
Patient Status: [facility] IP  Assessment and Plan: Patient in need of venous access.   Tunneled dialysis catheter placement  ______________________________________________________________________   History of Present Illness: Carlos Wyndham. is a 34 y.o. male   Covid + Oct 2019 Acute CVA  resp failure-- trach in place since 2/18  AKI due to ATN with ischemic/nephrotoxic insults Cardiogenic shock: milrinone Severe NICM  Started CRRT 04/23/19 Resumed CVVHD 05/06/19----trial CRRT without citrate with clotting Held and then resumed 2/25  Need for ongoing dialysis  Using Rt femoral catheter now   Allergies and medications reviewed.   Review of Systems: A 12 point ROS discussed and pertinent positives are indicated in the HPI above.  All other systems are negative.   Vital Signs: BP 107/67   Pulse 73   Temp 98.2 F (36.8 C) (Oral)   Resp 18   Ht 6\' 1"  (1.854 m)   Wt 243 lb 6.2 oz (110.4 kg)   SpO2 100%   BMI 32.11 kg/m   Physical Exam Vitals reviewed.  Cardiovascular:     Rate and Rhythm: Normal rate and regular rhythm.  Skin:    General: Skin is warm and dry.  Psychiatric:     Comments: Will consent mother for dialysis catheter      Imaging reviewed.   Labs:  COAGS: Recent Labs    05/13/2019 0355 04/23/19 0945 04/24/19 0843 04/26/19 0158 05/02/19 1300 05/14/19 0428 05/14/19 0820 05/15/19 0435 05/16/19 0544  INR 1.0 3.1* 1.8* 1.2  --   --   --   --   --   APTT 24  --   --   --    < > 55* 57* 55* 60*   < > = values in this interval not displayed.    BMP: Recent Labs    05/14/19 2144 05/14/19 2150 05/15/19 0435 05/15/19 0441 05/15/19 1650 05/15/19 1713 05/16/19 0544 05/16/19 0552 05/16/19 0751 05/16/19 0753  NA 139   < > 139   < > 139   < > 140  140 140 140 139  K 3.3*   < > 3.2*   < > 3.6   < > 3.2*  3.3* 3.3* 3.6 3.4*  CL 98   < > 96*   < > 98   < > 97*  96* 95* 96* 97*  CO2 27  --  28  --  27  --  29  --   --   --     GLUCOSE 151*   < > 205*   < > 195*   < > 157*  178* 159* 204* 172*  BUN 38*   < > 32*   < > 32*   < > 31*  26* 31* 25* 31*  CALCIUM 9.1  --  8.5*  --  8.5*  --  8.7*  --   --   --   CREATININE 2.95*   < > 2.59*   < > 3.02*   < > 2.61*  2.10* 2.60* 2.10* 2.60*  GFRNONAA 26*  --  31*  --  26*  --  31*  --   --   --   GFRAA 31*  --  36*  --  30*  --  36*  --   --   --    < > = values in this interval not displayed.   Scheduled for tunneled hemodialysis catheter placement Risks and benefits will be discussed with the patient's mother including, but  not limited to bleeding, infection, vascular injury, pneumothorax which may require chest tube placement, air embolism or even death   Electronically Signed: Lavonia Drafts, PA-C 05/16/2019, 2:16 PM   I spent a total of 15 minutes in face to face in clinical consultation, greater than 50% of which was counseling/coordinating care for venous access.Patient ID: Carlos Fichter., male   DOB: [birth year], 34 y.o.   MRN: [id number]

## 2019-05-16 NOTE — Evaluation (Signed)
Speech Language Pathology Evaluation Patient Details Name: Carlos Brewer. MRN: [id number] DOB: [birth year] Today's Date: 05/16/2019 Time: [id number] SLP Time Calculation (min) (ACUTE ONLY): 23 min  Problem List:  Patient Active Problem List   Diagnosis Date Noted  . Acute CVA (cerebrovascular accident) ([place])   . Acute renal failure ([place])   . Tracheostomy status ([place])   . Pressure injury of skin 05/02/2019  . Shock liver 04/23/2019  . Respiratory failure ([place]) 04/23/2019  . Occlusion of left middle cerebral artery 04/23/2019  . Middle cerebral artery embolism, left 05/12/2019  . Cerebrovascular accident (CVA) due to thrombosis of left middle cerebral artery ([place])   . Shock ([place])   . COVID-19 virus infection 12/14/2018  . COVID-19 12/13/2018  . Obesity (BMI 30-39.9)   . OSA on CPAP   . CKD (chronic kidney disease), stage II   . Polysubstance (excluding opioids) dependence ([place])   . Acute on chronic systolic CHF (congestive heart failure) ([place]) 07/12/2018  . Morbid obesity ([place]) 02/07/2017  . Nonischemic cardiomyopathy ([place]) 11/07/2016  . Chronic systolic heart failure ([place]) 11/05/2016  . Anxiety 11/05/2016  . Substance use disorder 11/05/2016  . Pneumonia 10/24/2016   Past Medical History:  Past Medical History:  Diagnosis Date  . CHF (congestive heart failure) ([place])   . Cocaine abuse ([place])   . HFrEF (heart failure with reduced ejection fraction) ([place])    a. 10/2016 Echo: EF 15-20%, Gr2 DD, mildly dil LA/RA.  Marland Kitchen History of cardiac cath    a. 10/2016 Cath: LM nl, LAD min irregs, LCX nl, RCA nl, EF 15%.  . Morbid obesity ([place])   . Myocarditis ([place])    a. 10/2016 Admit w/ CHF and trop elevation; b. 10/2016 Echo: EF 15-20%; c. 10/2016 Cath: Min irregs in LAD otw nl cors, EF 15%, glob HK.  Marland Kitchen NICM (nonischemic cardiomyopathy) ([place])    a. 10/2016 Echo: EF 15-20%, Gr2 DD; b. 10/2016 Cath: Nl cors.  . Palpitations   . Sleep apnea    USES CPAP  . Tobacco abuse    Past Surgical  History:  Past Surgical History:  Procedure Laterality Date  . CORONARY ANGIOPLASTY    . IR CT HEAD LTD  05/03/2019  . IR PATIENT EVAL TECH 0-60 MINS  04/23/2019  . IR PERCUTANEOUS ART THROMBECTOMY/INFUSION INTRACRANIAL INC DIAG ANGIO  04/27/2019  . NO PAST SURGERIES    . RADIOLOGY WITH ANESTHESIA N/A 05/07/2019   Procedure: RADIOLOGY WITH ANESTHESIA;  Surgeon: Luanne Bras, MD;  Location: [facility];  Service: Radiology;  Laterality: N/A;  . RIGHT/LEFT HEART CATH AND CORONARY ANGIOGRAPHY N/A 10/27/2016   Procedure: RIGHT/LEFT HEART CATH AND CORONARY ANGIOGRAPHY;  Surgeon: Wellington Hampshire, MD;  Location: [facility];  Service: Cardiovascular;  Laterality: N/A;  . WISDOM TOOTH EXTRACTION  2008   HPI:  34 yo male smoker found to have slurred speech and Rt sided weakness.  CT head showed M2 occlusion.  Ultimately treated with thrombolytic and thrombectomy by IR.  Required intubation for airway protection. Course complicated by septic shock, AKI requiring CRRT and polymorphic VT. Intubated 2/5-2/18.   Assessment / Plan / Recommendation Clinical Impression   Pt assessed for cognitive functioning, presenting with impaired verbal expression. Pt's mother was present and PMV was placed and tolerated for session. Pt primarily communicates via gestures, but multiple audible brief sounds were noted, including initial phonemes for his name and 1-2 days of the week. When presented with open ended questions or automatic speech (days  of week/counting), pt did not respond, or would attempt to mouth words, but was ultimately unintelligible. As he was presented with yes/no questions, he accurately answered questions via gestures (shaking head) with 100% accuracy. He was also able to follow 1 step commands with ~75-80% accuracy. Will continue to follow to target functional communication with PMV.    SLP Assessment  SLP Recommendation/Assessment: Patient needs continued Speech Lanaguage Pathology Services SLP  Visit Diagnosis: Aphonia (R49.1);Cognitive communication deficit (R41.841)    Follow Up Recommendations  Other (comment)(tbd)    Frequency and Duration min 2x/week  2 weeks      SLP Evaluation Cognition  Overall Cognitive Status: Impaired/Different from baseline Arousal/Alertness: Awake/alert Attention: Sustained Sustained Attention: Appears intact       Comprehension  Auditory Comprehension Overall Auditory Comprehension: Appears within functional limits for tasks assessed    Expression Expression Primary Mode of Expression: Nonverbal - gestures Verbal Expression Overall Verbal Expression: Impaired Initiation: Impaired Automatic Speech: Day of week;Counting Level of Generative/Spontaneous Verbalization: Word Repetition: Impaired Level of Impairment: Word level Effective Techniques: Open ended questions Non-Verbal Means of Communication: Gestures   Oral / Motor  Oral Motor/Sensory Function Overall Oral Motor/Sensory Function: Within functional limits   GO                   Aline August, Student SLP Office: [phone]  05/16/2019, 4:57 PM

## 2019-05-16 NOTE — Progress Notes (Signed)
NAME:  Chaddrick Brue., MRN:  [id number], DOB:  14-Mar-1986, LOS: 24 ADMISSION DATE:  04/23/2019, CONSULTATION DATE:  05/12/2019 REFERRING MD:  Dr. Leonel Ramsay, CHIEF COMPLAINT:  Slurred speech  Brief History   34 yo male smoker found to have slurred speech and Rt sided weakness.  Admitted with left MCA CVA and multiple smaller embolic infarcts s/p tPA and thrombectomy in IR.  Additionally found to have left ventricular atrial thrombus.    Course complicated by septic shock r/t HCAP, AKI requiring CRRT, polymorphic VT and cardiogenic shock.   Past Medical History  Systolic CHF with non ischemic CM, Cocaine abuse, OSA, DM Hx of CHF (EF 15%)  Hx myocarditis Smoker 1/2 ppd   Significant Hospital Events   2/05 Admit, tPA, IR thrombectomy 2/6 100% on fvent at 2300  Overnight requiring increasing levophed and phenylephrine.    2/6: switched to levophed, epi, started antibiotics, started on CRRT.  2/9  developed wide-complex tachycardia and hypotension, started on amiodarone drip, increase Levophed drip 2/15 drop in platelets, heparin stopped, bival started 2/17 Hypotensive and back on Levophed 2/18 trach, lines changed 2/19 started milrinone , fever 103 2/20 Episode of wide-complex tachycardia yesterday, changed from Levophed to vasopressin 2/22 stopping vanc. Still on inotrope support. Some AF w/ RVR. Had to be placed back on pressors. ivabradine added 2/23 still on pressors/ CRRT continued 2/24 tmax 98.4, Remains on CRRT, even UF, remains anuric, Levophed at 14 mcg/min, Milrinone at 0.125 mcg/kg/min, Coox 63.4 Doing well this morning on ATC, no events overnight.  Midodrine added   2/25 more interactive, remains on ATC, tmax 99.5/ WBC 21.4, ~900 ml liquid stool/ 24 hours, neg Cdiff, Levophed up to 20 mcg/min, CVP 2, ,milrinone remains at 0.125 mcg/kg/min, coox 92.7, Off CRRT since last night ~2000 s/p clotted x 3 off citrate, restarted   Consults:  Neuro IR Cardiology   Nephrology Heart Failure  EP   Procedures:  2/5-2/6:  TPA given at 428 am (total of 90 Mg) 520 am went to IR  S/P Lt common carotid arteriogram followed by complete revascularization of occluded LT MCA sup division mid M2 seg with x 1 pass with 56mx 40 mm solitaire X ret river device and penumbra aspiration with TICI 3 revascularization  ETT 2/05 >> 2/18 LIJ CVL 2/5 >>2/18  RT [place] CVL 2/18 >> RIJ HD cath 2/6 >>2/18 6 shiley cuffed trach 2/18  >>  RT fem HD 2/19 >>  Significant Diagnostic Tests:  CT angio head/neck 2/05 >> occlusion of Lt MCA bifurcation Echo 2/05 >> EF less than 20%, cannot rule out apical thrombus MRI brain 2/11 > extensive acute infarction of multiple areas without large or medium vessel occlusion Echocardiogram 2/8 Left ventricular ejection fraction, by estimation, is <20%. The left  ventricle has severely decreased function. The left ventricle demonstrates  global hypokinesis. The left ventricular internal cavity size was severely  dilated. Possible small 0.8 x  0.6 cm apical thrombus. Somewhat subtle finding, could confirm with  cardiac MRI. However, if patient has had CVA, would be reasonable to  anticoagulate given severity of LV dysfunction if no contraindications to  anticoagulation.   Micro Data:  SARS CoV2 PCR 2/05 >> negative Influenza PCR 2/05 >> negative Urine 2/6 >> ng resp 2/6 >> nml flora BC 2/6 >>ng resp 2/16 >> [facility] >> negative  BC x 2 2/25 >> Cdiff 2/25 >> neg  Antimicrobials:  mero 2/6 -2/12 Zosyn 2/6 , 2/19 >> 2/23 vanc 2/6 >>  2/9 , 2/19 >>2/22  Interim history/subjective:   Amnio drip restarted for WCT/atrial flutter Remains critically ill, on CRRT On milrinone 0.125 and Levophed   Objective   Blood pressure 121/64, pulse 73, temperature 98.2 F (36.8 C), temperature source Oral, resp. rate (!) 24, height '6\' 1"'$  (1.854 m), weight 110.4 kg, SpO2 100 %. CVP:  [0 mmHg-13 mmHg] 3 mmHg  FiO2 (%):  [28 %] 28 %    Intake/Output Summary (Last 24 hours) at 05/16/2019 1613 Last data filed at 05/16/2019 1600 Gross per 24 hour  Intake 4148.66 ml  Output 3621 ml  Net 527.66 ml   Filed Weights   05/14/19 0500 05/15/19 0500 05/16/19 0600  Weight: 108.7 kg 109.6 kg 110.4 kg    Examination: General: Well-built, well-nourished, lying in bed in no acute distress HEENT: [place]/AT trach midline, MM pink/moist, PERRL,  Neuro: Alert and interactive, able to follow simple commands , moves all 4 extremities CV: s1s2 regular rate and rhythm, no murmur, rubs, or gallops,  PULM:  Clear to ascultation bilaterally, no accessory muscle use, mild secretions GI: soft, bowel sounds active in all 4 quadrants, non-tender, non-distended, tolerating TF Extremities: warm/dry, no  edema  Skin: no rashes or lesions  Labs show no leukocytosis, stable anemia, low co-ox   Resolved Hospital Problem list     HCAP 2/19- 2/23 zosyn/ vanc  Assessment & Plan:   Left MCA CVA Multiple other infarcts noted on MRI 6/60-[id number] embolic Acute metabolic encephalopathy -resolved P: PT/OT efforts as able , hope removal of femoral line will facilitate Continue nightly Seroquel  Acute systolic HF with cardiogenic shock in setting of NICM, ejection fraction is less than 20% P: Heart failure team following, appreciate assistance Continue Levophed Stopping milrinone, but coox seems low   Left ventricle apical thrombus non ischemic cardiomyopathy with systolic congestive heart failure Polymorphic VT on 2/9, WCT 2/19 , 2/28 HLD P:  telemetry  Primary management per cardiology  Continue IV Amiodarone  Daily ASA and Lipitor     Acute hypoxemic respiratory failure now tracheostomy dependent after stroke - last on MV 2/21 Plan Continue aggressive pulmonary hygiene  Trach care  Remain on ATC SLP following , and progress with PMV valve Can consider cuffless trach in the near future   Acute kidney injury. Still anuric  - CRRT  started 2/6 Plan Continue on CRRT , negative balance as able Nephrology following  We will plan for permacath since he is having an infection free interval  HIT without thrombosis but needs anticoagulation for embolic CVA - platelets recovered -SRA positive at 90 Plan Avoid heparin Continue Bivalirdin -transition to DOAC eventually Trend CBC   DM type II poorly controlled Plan Improved control  CBG 1 40-1 80 acceptable SSI   Hx of cocaine abuse. Depression. - not active per UDS  Plan Continue Prozac   Best practice:  Diet: NPO,  TFs DVT prophylaxis: SCDs/ bivalirudin GI prophylaxis: protonix Mobility: progress when able Code Status: full code Disposition: ICU  Family -mother updated at bedside  The patient is critically ill with multiple organ systems failure and requires high complexity decision making for assessment and support, frequent evaluation and titration of therapies, application of advanced monitoring technologies and extensive interpretation of multiple databases. Critical Care Time devoted to patient care services described in this note independent of APP/resident  time is 35 minutes.   [name] MD. [place]. [facility]  If no response to pager , please call [phone]  05/16/2019    

## 2019-05-16 NOTE — Progress Notes (Deleted)
ANTICOAGULATION CONSULT NOTE  Pharmacy Consult:  Bivalirudin Indication: stroke 2/5, LV thrombus 2/8, HIT 2/15   Allergies  Allergen Reactions  . Heparin Other (See Comments)    Heparin induced thrombocytopenia. 2/15 HIT OD 1.692. 2/16 SRA positive-90.     Patient Measurements: Height: 6\' 1"  (185.4 cm) Weight: 243 lb 6.2 oz (110.4 kg) IBW/kg (Calculated) : 79.9    Vital Signs: Temp: 99.1 F (37.3 C) (03/01 0800) Temp Source: Axillary (03/01 0800) BP: 113/70 (03/01 0900) Pulse Rate: 81 (03/01 0900)  Labs: Recent Labs    05/13/19 1649 05/13/19 1923 05/14/19 0820 05/14/19 1446 05/15/19 0435 05/15/19 0441 05/15/19 0900 05/15/19 1519 05/16/19 0544 05/16/19 0544 05/16/19 0552 05/16/19 0751  HGB 10.0*   < >  --    < >  --    < > 9.2*   < > 9.9*   < > 9.2* 10.5*  HCT 32.6*   < >  --    < >  --    < > 29.8*   < > 29.0*  --  27.0* 31.0*  PLT 431*  --   --   --   --   --  354  --   --   --   --   --   APTT  --    < > 57*  --  55*  --   --   --  60*  --   --   --   CREATININE  --    < >  --    < > 2.59*   < >  --    < > 2.61*  2.10*  --  2.60* 2.10*   < > = values in this interval not displayed.    Estimated Creatinine Clearance: 64.6 mL/min (A) (by C-G formula based on SCr of 2.1 mg/dL (H)).  Assessment: 34 yr old male with left MCA infarct - received TPA and revascularization with IR on 2/5. On 2/8 found to have small LV apical thrombus on ECHO. Pharmacy consulted to dose IV heparin, which was switched to bivalirudin 2/15 for HIT.    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 SRA very positive at 77. Heparin allergy has appropriately been added to chart and updated with SRA results.   APTT remains therapeutic on rate of 0.175 mg/kg/hr (36.5 mL/hr) with no issues with infusion. Weight adjusted in pump to match actual body weight of 104.4 kg (fluctuating some but this is most consistent weight therefore order specific weight for drip). No  bleeding reported. Hemoglobin is low but stable. Platelets are elevated on last check at 431 but trending back down to normal. Remains on CRRT.   Goal of Therapy:  APTT 50- 65 sec  Monitor platelets by anticoagulation protocol: Yes   Plan:  Continue bivalrudin at 0.175 mg/kg/hr Daily aPTT and CBC  Alanda Slim, PharmD, [facility] Clinical Pharmacist Please see AMION for all Pharmacists' Contact Phone Numbers 05/16/2019, 9:11 AM

## 2019-05-16 NOTE — Evaluation (Signed)
Clinical/Bedside Swallow Evaluation Patient Details  Name: Carlos Brewer. MRN: [id number] Date of Birth: 04/15/1985  Today's Date: 05/16/2019 Time: SLP Start Time (ACUTE ONLY): [id number] SLP Stop Time (ACUTE ONLY): 1620 SLP Time Calculation (min) (ACUTE ONLY): 23 min  Past Medical History:  Past Medical History:  Diagnosis Date  . CHF (congestive heart failure) ([place])   . Cocaine abuse ([place])   . HFrEF (heart failure with reduced ejection fraction) ([place])    a. 10/2016 Echo: EF 15-20%, Gr2 DD, mildly dil LA/RA.  Marland Kitchen History of cardiac cath    a. 10/2016 Cath: LM nl, LAD min irregs, LCX nl, RCA nl, EF 15%.  . Morbid obesity ([place])   . Myocarditis ([place])    a. 10/2016 Admit w/ CHF and trop elevation; b. 10/2016 Echo: EF 15-20%; c. 10/2016 Cath: Min irregs in LAD otw nl cors, EF 15%, glob HK.  Marland Kitchen NICM (nonischemic cardiomyopathy) ([place])    a. 10/2016 Echo: EF 15-20%, Gr2 DD; b. 10/2016 Cath: Nl cors.  . Palpitations   . Sleep apnea    USES CPAP  . Tobacco abuse    Past Surgical History:  Past Surgical History:  Procedure Laterality Date  . CORONARY ANGIOPLASTY    . IR CT HEAD LTD  05/09/2019  . IR PATIENT EVAL TECH 0-60 MINS  04/23/2019  . IR PERCUTANEOUS ART THROMBECTOMY/INFUSION INTRACRANIAL INC DIAG ANGIO  04/19/2019  . NO PAST SURGERIES    . RADIOLOGY WITH ANESTHESIA N/A 04/18/2019   Procedure: RADIOLOGY WITH ANESTHESIA;  Surgeon: Luanne Bras, MD;  Location: [place];  Service: Radiology;  Laterality: N/A;  . RIGHT/LEFT HEART CATH AND CORONARY ANGIOGRAPHY N/A 10/27/2016   Procedure: RIGHT/LEFT HEART CATH AND CORONARY ANGIOGRAPHY;  Surgeon: Wellington Hampshire, MD;  Location: [facility];  Service: Cardiovascular;  Laterality: N/A;  . WISDOM TOOTH EXTRACTION  2008   HPI:  34 yo male smoker found to have slurred speech and Rt sided weakness.  CT head showed M2 occlusion.  Ultimately treated with thrombolytic and thrombectomy by IR.  Required intubation for airway protection. Course  complicated by septic shock, AKI requiring CRRT and polymorphic VT. Intubated 2/5-2/18.   Assessment / Plan / Recommendation Clinical Impression   Pt upright in bed, with PMV placed and tolerated during session. Pt observed with x4 ice chips. Following all trials, the pt was observed with prolonged oral transit, suspected delayed swallow initiation, and immediate/delayed coughing, concerning for aspiration. Initial spontaneous cough seemed strong, but additional coughing seemed weak despite cues for increased effort. Given pt's current medical status, s/sx of aspiration and prolonged intubation, further instrumental testing will be necessary, possibly later within the week. Will continue to follow to determine readiness.   SLP Visit Diagnosis: Dysphagia, oropharyngeal phase (R13.12);Aphonia (R49.1)    Aspiration Risk  Moderate aspiration risk    Diet Recommendation NPO        Other  Recommendations Oral Care Recommendations: Oral care BID   Follow up Recommendations Other (comment)(tbd)      Frequency and Duration min 2x/week  2 weeks       Prognosis Prognosis for Safe Diet Advancement: Fair Barriers to Reach Goals: Severity of deficits      Swallow Study   General HPI: 34 yo male smoker found to have slurred speech and Rt sided weakness.  CT head showed M2 occlusion.  Ultimately treated with thrombolytic and thrombectomy by IR.  Required intubation for airway protection. Course complicated by septic shock, AKI requiring CRRT and  polymorphic VT. Intubated 2/5-2/18. Type of Study: Bedside Swallow Evaluation Previous Swallow Assessment: none in chart Diet Prior to this Study: NPO Temperature Spikes Noted: No Respiratory Status: Room air History of Recent Intubation: Yes Length of Intubations (days): 13 days Date extubated: 05/05/19 Behavior/Cognition: Alert;Cooperative Oral Cavity Assessment: Within Functional Limits Oral Care Completed by SLP: No Oral Cavity - Dentition:  Adequate natural dentition Vision: Functional for self-feeding Self-Feeding Abilities: Needs assist Patient Positioning: Upright in bed Baseline Vocal Quality: Aphonic Volitional Swallow: Able to elicit    Oral/Motor/Sensory Function Overall Oral Motor/Sensory Function: Within functional limits   Ice Chips Ice chips: Impaired Presentation: Spoon Oral Phase Functional Implications: Prolonged oral transit Pharyngeal Phase Impairments: Suspected delayed Swallow;Cough - Immediate;Cough - Delayed   Thin Liquid Thin Liquid: Not tested    Nectar Thick Nectar Thick Liquid: Not tested   Honey Thick Honey Thick Liquid: Not tested   Puree Puree: Not tested   Solid   Aline August, Student SLP Office: [phone]   Solid: Not tested     05/16/2019,4:38 PM

## 2019-05-16 NOTE — Progress Notes (Addendum)
Advanced Heart Failure Rounding Note   Subjective:     On milrinone 0.125 and NE 8 mcg. CO-OX 91%.   On CVVHD   Yesterday IV amio started for A flutter/WCT  WBC up to 21K -> 14.4 -> 10.2->pending  BCx 2/25 remain NGTD    Following commands. Denies pain.    Objective:   Weight Range:  Vital Signs:   Temp:  [98.2 F (36.8 C)-100 F (37.8 C)] 99.1 F (37.3 C) (03/01 0800) Pulse Rate:  [29-92] 70 (03/01 1128) Resp:  [12-26] 22 (03/01 1100) BP: (95-133)/(52-83) 117/74 (03/01 1128) SpO2:  [96 %-100 %] 100 % (03/01 1100) FiO2 (%):  [28 %] 28 % (03/01 1128) Weight:  [110.4 kg] 110.4 kg (03/01 0600) Last BM Date: 05/16/19  Weight change: Filed Weights   05/14/19 0500 05/15/19 0500 05/16/19 0600  Weight: 108.7 kg 109.6 kg 110.4 kg    Intake/Output:   Intake/Output Summary (Last 24 hours) at 05/16/2019 1135 Last data filed at 05/16/2019 1100 Gross per 24 hour  Intake 4226.12 ml  Output 2844 ml  Net 1382.12 ml     Physical Exam: CVP 2  General:  Awake on TC.  HEENT: normal Neck: supple. no JVD. Carotids 2+ bilat; no bruits. No lymphadenopathy or thryomegaly appreciated. Cor: PMI nondisplaced. Regular rate & rhythm. No rubs, gallops or murmurs. R subclavian TLC.  Lungs: clear on trach collar. Abdomen: soft, nontender, nondistended. No hepatosplenomegaly. No bruits or masses. Good bowel sounds. Extremities: no cyanosis, clubbing, rash, edema. R femoral HD cath Neuro: alert . Follows all commands. MAE.   Telemetry:  SR with PVCs. 70-80s     Labs: Basic Metabolic Panel: Recent Labs  Lab 05/13/19 0503 05/13/19 3646 05/14/19 0428 05/14/19 0443 05/14/19 1510 05/14/19 1510 05/14/19 2144 05/14/19 2150 05/15/19 0435 05/15/19 0441 05/15/19 1650 05/15/19 1713 05/16/19 0132 05/16/19 0544 05/16/19 0552 05/16/19 0751 05/16/19 0753  NA 137   < > 140   < > 139   < > 139   < > 139   < > 139   < > 139 140  140 140 140 139  K 3.7   < > 3.5   < > 3.5   < >  3.3*   < > 3.2*   < > 3.6   < > 3.3* 3.2*  3.3* 3.3* 3.6 3.4*  CL 95*   < > 99   < > 97*   < > 98   < > 96*   < > 98   < > 96* 97*  96* 95* 96* 97*  CO2 23   < > 25   < > 27  --  27  --  28  --  27  --   --  29  --   --   --   GLUCOSE 248*   < > 163*   < > 139*   < > 151*   < > 205*   < > 195*   < > 190* 157*  178* 159* 204* 172*  BUN 65*   < > 44*   < > 39*   < > 38*   < > 32*   < > 32*   < > 33* 31*  26* 31* 25* 31*  CREATININE 4.32*   < > 3.13*   < > 2.95*   < > 2.95*   < > 2.59*   < > 3.02*   < > 2.60* 2.61*  2.10* 2.60*  2.10* 2.60*  CALCIUM 9.6   < > 9.0   < > 9.7   < > 9.1   < > 8.5*  --  8.5*  --   --  8.7*  --   --   --   MG 2.3  --  2.2  --   --   --  2.0  --  1.9  --   --   --   --  1.9  --   --   --   PHOS 5.8*   < > 4.3  --  4.3  --   --   --  3.7  --  4.0  --   --  3.8  --   --   --    < > = values in this interval not displayed.    Liver Function Tests: Recent Labs  Lab 05/14/19 0428 05/14/19 1510 05/15/19 0435 05/15/19 1650 05/16/19 0544  ALBUMIN 2.6* 2.6* 2.7* 2.6* 2.6*   No results for input(s): LIPASE, AMYLASE in the last 168 hours. No results for input(s): AMMONIA in the last 168 hours.  CBC: Recent Labs  Lab 05/12/19 0206 05/12/19 1408 05/13/19 1649 05/13/19 1923 05/15/19 0900 05/15/19 1519 05/16/19 0132 05/16/19 0544 05/16/19 0552 05/16/19 0751 05/16/19 0753  WBC 21.4*  --  14.4*  --  10.2  --   --   --   --   --   --   HGB 11.8*   < > 10.0*   < > 9.2*   < > 9.9* 9.9* 9.2* 10.5* 9.2*  HCT 38.0*   < > 32.6*   < > 29.8*   < > 29.0* 29.0* 27.0* 31.0* 27.0*  MCV 99.2  --  100.9*  --  100.3*  --   --   --   --   --   --   PLT 625*  --  431*  --  354  --   --   --   --   --   --    < > = values in this interval not displayed.    Cardiac Enzymes: No results for input(s): CKTOTAL, CKMB, CKMBINDEX, TROPONINI in the last 168 hours.  BNP: BNP (last 3 results) Recent Labs    12/13/18 1750 12/15/18 0405 04/23/19 0945  BNP 1,156.1* 1,161.0*  2,909.9*    ProBNP (last 3 results) No results for input(s): PROBNP in the last 8760 hours.    Other results:  Imaging: No results found.   Medications:     Scheduled Medications: . sodium chloride   Intravenous Once  . aspirin  81 mg Per Tube Daily  . atorvastatin  40 mg Per Tube q1800  . chlorhexidine gluconate (MEDLINE KIT)  15 mL Mouth Rinse BID  . Chlorhexidine Gluconate Cloth  6 each Topical Q0600  . feeding supplement (PRO-STAT SUGAR FREE 64)  60 mL Per Tube TID  . FLUoxetine  20 mg Per Tube Daily  . insulin aspart  0-20 Units Subcutaneous Q4H  . insulin aspart  10 Units Subcutaneous Q4H  . insulin detemir  35 Units Subcutaneous BID  . ivabradine  5 mg Per Tube BID WC  . mouth rinse  15 mL Mouth Rinse 10 times per day  . midodrine  15 mg Per Tube TID WC  . pantoprazole sodium  40 mg Per Tube Q1200  . QUEtiapine  25 mg Per Tube QHS  . sodium chloride flush  10-40 mL Intracatheter Q12H  . sodium  chloride flush  3 mL Intravenous Q12H    Infusions: . sodium chloride Stopped (05/04/19 0200)  . sodium chloride    . amiodarone 30 mg/hr (05/16/19 0600)  . anticoagulant sodium citrate    . bivalirudin (ANGIOMAX) infusion 0.5 mg/mL (Non-ACS indications) 0.175 mg/kg/hr (05/16/19 1100)  . calcium gluconate infusion for CRRT 20 g (05/16/19 1027)  . feeding supplement (VITAL AF 1.2 CAL) 1,000 mL (05/16/19 0558)  . milrinone 0.125 mcg/kg/min (05/16/19 1100)  . norepinephrine (LEVOPHED) Adult infusion 8 mcg/min (05/16/19 1100)  . prismasol [id number] 4/0 300 mL/hr at 05/16/19 0528  . prismasol B22GK 4/0 2,000 mL/hr at 05/16/19 1047  . sodium citrate 2 %/dextrose 2.5% solution 3000 mL 250 mL/hr at 05/16/19 0414    PRN Medications: sodium chloride, sodium chloride, anticoagulant sodium citrate, bisacodyl, docusate, fentaNYL (SUBLIMAZE) injection, sodium chloride flush, sodium chloride flush   Assessment/Plan:   1. Acute on chronic systolic HF due to NICM-> cardiogenic  shock - Echo EF < 20% - now on milrinone 0.125 mg and NE 8 . Repeating CO-OX  - CVP 2.  - Continue midodrine at 15 tid.  2. AKI -> ESRD - started on CRRT 04/23/19 -making some urine per nursing staff but but not captured due to incontinence.   - let CVP climb as above - if unable to get him to tolerate iHD of pressors would be a palliative situation 3. Acute CVA with severe residual deficits due to extensive R cerebellar, midbrain, R MCA and L MCA infarcts  - likely cardioembolic - s/p clot extraction 4. Acute hypoxic respiratory failure - s/p trach  - stable on trac collar 28% 5. Wide complex tachycardia/frequent PVCs/ventricular bigeminy - continues with frequent bigeminy - had recurrent atrial tach/flutter yesterday--> Stop ivabradine.  - - Continue IV amiodarone.   - Keep K >4.0, Mg >2.0 - K 3.4. Supp K.   - Mag 1.9. Give 2 grams Mag  - no change. No b-blocker with shock 6. Leukocytosis - high risk for sepsis - remains AF - CBC pending.  - cultures redrawn 2/25 remain NGTD  Length of Stay: [place]  NP-C  05/16/2019, 11:35 AM  Advanced Heart Failure Team Pager [phone] (M-F; [place])  Please contact [facility] for night-coverage after hours (4p -7a ) and weekends on [email or web address]  Agree.  He remains awake on trach collar. On CVVHD keeping even. On milrinone 0.125. Was off NE overnight but now back on at 4. Co-ox pending. CVP 3. Bigeminy improved with addition of IV amio yesterday.   General:  Awake on trach collar. Following commands  HEENT: normal Neck: supple. no JVD. + trach Carotids 2+ bilat; no bruits. No lymphadenopathy or thryomegaly appreciated. Cor: PMI nondisplaced. Regular rate & rhythm. No rubs, gallops or murmurs. Lungs: clear Abdomen: obese soft, nontender, nondistended. No hepatosplenomegaly. No bruits or masses. Good bowel sounds. Extremities: no cyanosis, clubbing, rash, edema Neuro: Awake on trach collar. Following commands   He remains  quite tenuous. Was off NE for a while last night but now back on. CVP remains low despite being 10 pounds up over the last few days, However weight still about 30 pounds below outpatient weight of 270. Will give another 500cc IVF back. Wil repeat co-ox and stop milrinone as likely adding to vasoplegia. Continue to wean NE as possible. If cannot tolerated iHD of pressors then would be looking at comfort care situation.   Bigeminy and atrial tach stabilized on IV amio. Wil continue.  D/w Dr. Elsworth Soho.   CRITICAL CARE Performed by: Glori Bickers  Total critical care time: 35 minutes  Critical care time was exclusive of separately billable procedures and treating other patients.  Critical care was necessary to treat or prevent imminent or life-threatening deterioration.  Critical care was time spent personally by me (independent of midlevel providers or residents) on the following activities: development of treatment plan with patient and/or surrogate as well as nursing, discussions with consultants, evaluation of patient's response to treatment, examination of patient, obtaining history from patient or surrogate, ordering and performing treatments and interventions, ordering and review of laboratory studies, ordering and review of radiographic studies, pulse oximetry and re-evaluation of patient's condition.  Glori Bickers, MD  3:20 PM

## 2019-05-16 NOTE — Progress Notes (Signed)
ANTICOAGULATION CONSULT NOTE  Pharmacy Consult:  Bivalirudin Indication: stroke 2/5, LV thrombus 2/8, HIT 2/15   Allergies  Allergen Reactions  . Heparin Other (See Comments)    Heparin induced thrombocytopenia. 2/15 HIT OD 1.692. 2/16 SRA positive-90.     Patient Measurements: Height: 6\' 1"  (185.4 cm) Weight: 243 lb 6.2 oz (110.4 kg) IBW/kg (Calculated) : 79.9    Vital Signs: Temp: 98.2 F (36.8 C) (03/01 0400) Temp Source: Axillary (03/01 0400) BP: 110/80 (03/01 0806) Pulse Rate: 84 (03/01 0806)  Labs: Recent Labs    05/13/19 1649 05/13/19 1923 05/14/19 0820 05/14/19 1446 05/15/19 0435 05/15/19 0441 05/15/19 0900 05/15/19 1519 05/16/19 0132 05/16/19 0132 05/16/19 0544 05/16/19 0552  HGB 10.0*   < >  --    < >  --    < > 9.2*   < > 9.9*   < > 9.9* 9.2*  HCT 32.6*   < >  --    < >  --    < > 29.8*   < > 29.0*  --  29.0* 27.0*  PLT 431*  --   --   --   --   --  354  --   --   --   --   --   APTT  --    < > 57*  --  55*  --   --   --   --   --  60*  --   CREATININE  --    < >  --    < > 2.59*   < >  --    < > 2.60*  --  2.61*  2.10* 2.60*   < > = values in this interval not displayed.    Estimated Creatinine Clearance: 52.2 mL/min (A) (by C-G formula based on SCr of 2.6 mg/dL (H)).  Assessment: 34 yr old male with left MCA infarct - received TPA and revascularization with IR on 2/5. On 2/8 found to have small LV apical thrombus on ECHO. Pharmacy consulted to dose IV heparin, which was switched to bivalirudin 2/15 for HIT.    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 SRA very positive at 46. Heparin allergy has appropriately been added to chart and updated with SRA results.   APTT remains therapeutic at 60 seconds on rate of 0.175 mg/kg/hr (36.5 mL/hr) with no issues with infusion. Weight adjusted in pump to match actual body weight of 104.4 kg (fluctuating some but this is most consistent weight therefore order specific weight for  drip). No bleeding reported. Hemoglobin is low but stable. Platelets are back down to normal. Remains on CRRT.   Goal of Therapy:  APTT 50- 65 sec  Monitor platelets by anticoagulation protocol: Yes   Plan:  Continue bivalrudin at 0.175 mg/kg/hr Daily aPTT and CBC  [name], PharmD PGY1 Pharmacy Resident

## 2019-05-16 DEATH — deceased

## 2019-05-17 ENCOUNTER — Inpatient Hospital Stay (HOSPITAL_COMMUNITY): Payer: No Typology Code available for payment source

## 2019-05-17 DIAGNOSIS — N170 Acute kidney failure with tubular necrosis: Secondary | ICD-10-CM | POA: Diagnosis not present

## 2019-05-17 DIAGNOSIS — R6521 Severe sepsis with septic shock: Secondary | ICD-10-CM | POA: Diagnosis not present

## 2019-05-17 DIAGNOSIS — A419 Sepsis, unspecified organism: Secondary | ICD-10-CM | POA: Diagnosis not present

## 2019-05-17 DIAGNOSIS — Z930 Tracheostomy status: Secondary | ICD-10-CM | POA: Diagnosis not present

## 2019-05-17 HISTORY — PX: IR US GUIDE VASC ACCESS RIGHT: IMG2390

## 2019-05-17 HISTORY — PX: IR FLUORO GUIDE CV LINE RIGHT: IMG2283

## 2019-05-17 LAB — RENAL FUNCTION PANEL
Albumin: 2.6 g/dL — ABNORMAL LOW (ref 3.5–5.0)
Albumin: 2.6 g/dL — ABNORMAL LOW (ref 3.5–5.0)
Anion gap: 14 (ref 5–15)
Anion gap: 13 (ref 5–15)
BUN: 28 mg/dL — ABNORMAL HIGH (ref 6–20)
BUN: 25 mg/dL — ABNORMAL HIGH (ref 6–20)
CO2: 33 mmol/L — ABNORMAL HIGH (ref 22–32)
CO2: 30 mmol/L (ref 22–32)
Calcium: 9.6 mg/dL (ref 8.9–10.3)
Calcium: 9.3 mg/dL (ref 8.9–10.3)
Chloride: 93 mmol/L — ABNORMAL LOW (ref 98–111)
Chloride: 97 mmol/L — ABNORMAL LOW (ref 98–111)
Creatinine, Ser: 2.34 mg/dL — ABNORMAL HIGH (ref 0.61–1.24)
Creatinine, Ser: 2.65 mg/dL — ABNORMAL HIGH (ref 0.61–1.24)
GFR calc Af Amer: 41 mL/min — ABNORMAL LOW (ref >60)
GFR calc Af Amer: 35 mL/min — ABNORMAL LOW (ref >60)
GFR calc non Af Amer: 35 mL/min — ABNORMAL LOW (ref >60)
GFR calc non Af Amer: 30 mL/min — ABNORMAL LOW (ref >60)
Glucose, Bld: 118 mg/dL — ABNORMAL HIGH (ref 70–99)
Glucose, Bld: 106 mg/dL — ABNORMAL HIGH (ref 70–99)
Phosphorus: 4.4 mg/dL (ref 2.5–4.6)
Phosphorus: 4.9 mg/dL — ABNORMAL HIGH (ref 2.5–4.6)
Potassium: 3.1 mmol/L — ABNORMAL LOW (ref 3.5–5.1)
Potassium: 3.2 mmol/L — ABNORMAL LOW (ref 3.5–5.1)
Sodium: 140 mmol/L (ref 135–145)
Sodium: 140 mmol/L (ref 135–145)

## 2019-05-17 LAB — MAGNESIUM: Magnesium: 2.2 mg/dL (ref 1.7–2.4)

## 2019-05-17 LAB — POCT I-STAT, CHEM 8
BUN: 26 mg/dL — ABNORMAL HIGH (ref 6–20)
BUN: 29 mg/dL — ABNORMAL HIGH (ref 6–20)
BUN: 22 mg/dL — ABNORMAL HIGH (ref 6–20)
BUN: 28 mg/dL — ABNORMAL HIGH (ref 6–20)
Calcium, Ion: 0.59 mmol/L — CL (ref 1.15–1.40)
Calcium, Ion: 1.07 mmol/L — ABNORMAL LOW (ref 1.15–1.40)
Calcium, Ion: 0.54 mmol/L — CL (ref 1.15–1.40)
Calcium, Ion: 1.02 mmol/L — ABNORMAL LOW (ref 1.15–1.40)
Chloride: 91 mmol/L — ABNORMAL LOW (ref 98–111)
Chloride: 93 mmol/L — ABNORMAL LOW (ref 98–111)
Chloride: 93 mmol/L — ABNORMAL LOW (ref 98–111)
Chloride: 93 mmol/L — ABNORMAL LOW (ref 98–111)
Creatinine, Ser: 2.1 mg/dL — ABNORMAL HIGH (ref 0.61–1.24)
Creatinine, Ser: 2.5 mg/dL — ABNORMAL HIGH (ref 0.61–1.24)
Creatinine, Ser: 1.8 mg/dL — ABNORMAL HIGH (ref 0.61–1.24)
Creatinine, Ser: 2.3 mg/dL — ABNORMAL HIGH (ref 0.61–1.24)
Glucose, Bld: 144 mg/dL — ABNORMAL HIGH (ref 70–99)
Glucose, Bld: 188 mg/dL — ABNORMAL HIGH (ref 70–99)
Glucose, Bld: 141 mg/dL — ABNORMAL HIGH (ref 70–99)
Glucose, Bld: 177 mg/dL — ABNORMAL HIGH (ref 70–99)
HCT: 27 % — ABNORMAL LOW (ref 39.0–52.0)
HCT: 30 % — ABNORMAL LOW (ref 39.0–52.0)
HCT: 28 % — ABNORMAL LOW (ref 39.0–52.0)
HCT: 30 % — ABNORMAL LOW (ref 39.0–52.0)
Hemoglobin: 10.2 g/dL — ABNORMAL LOW (ref 13.0–17.0)
Hemoglobin: 9.2 g/dL — ABNORMAL LOW (ref 13.0–17.0)
Hemoglobin: 10.2 g/dL — ABNORMAL LOW (ref 13.0–17.0)
Hemoglobin: 9.5 g/dL — ABNORMAL LOW (ref 13.0–17.0)
Potassium: 3.3 mmol/L — ABNORMAL LOW (ref 3.5–5.1)
Potassium: 3.3 mmol/L — ABNORMAL LOW (ref 3.5–5.1)
Potassium: 3.4 mmol/L — ABNORMAL LOW (ref 3.5–5.1)
Potassium: 3.5 mmol/L (ref 3.5–5.1)
Sodium: 139 mmol/L (ref 135–145)
Sodium: 140 mmol/L (ref 135–145)
Sodium: 139 mmol/L (ref 135–145)
Sodium: 139 mmol/L (ref 135–145)
TCO2: 32 mmol/L (ref 22–32)
TCO2: 36 mmol/L — ABNORMAL HIGH (ref 22–32)
TCO2: 32 mmol/L (ref 22–32)
TCO2: 34 mmol/L — ABNORMAL HIGH (ref 22–32)

## 2019-05-17 LAB — GLUCOSE, CAPILLARY
Glucose-Capillary: 90 mg/dL (ref 70–99)
Glucose-Capillary: 137 mg/dL — ABNORMAL HIGH (ref 70–99)
Glucose-Capillary: 139 mg/dL — ABNORMAL HIGH (ref 70–99)
Glucose-Capillary: 82 mg/dL (ref 70–99)
Glucose-Capillary: 145 mg/dL — ABNORMAL HIGH (ref 70–99)
Glucose-Capillary: 149 mg/dL — ABNORMAL HIGH (ref 70–99)

## 2019-05-17 LAB — CULTURE, BLOOD (ROUTINE X 2)
Culture: NO GROWTH
Culture: NO GROWTH

## 2019-05-17 LAB — COOXEMETRY PANEL
Carboxyhemoglobin: 2 % — ABNORMAL HIGH (ref 0.5–1.5)
Methemoglobin: 1 % (ref 0.0–1.5)
O2 Saturation: 62.3 %
Total hemoglobin: 9.1 g/dL — ABNORMAL LOW (ref 12.0–16.0)

## 2019-05-17 LAB — CBC
HCT: 28 % — ABNORMAL LOW (ref 39.0–52.0)
Hemoglobin: 8.8 g/dL — ABNORMAL LOW (ref 13.0–17.0)
MCH: 31.5 pg (ref 26.0–34.0)
MCHC: 31.4 g/dL (ref 30.0–36.0)
MCV: 100.4 fL — ABNORMAL HIGH (ref 80.0–100.0)
Platelets: 252 10*3/uL (ref 150–400)
RBC: 2.79 MIL/uL — ABNORMAL LOW (ref 4.22–5.81)
RDW: 14.6 % (ref 11.5–15.5)
WBC: 6.8 10*3/uL (ref 4.0–10.5)
nRBC: 0 % (ref 0.0–0.2)

## 2019-05-17 LAB — PROTIME-INR
INR: 1.5 — ABNORMAL HIGH (ref 0.8–1.2)
Prothrombin Time: 17.6 seconds — ABNORMAL HIGH (ref 11.4–15.2)

## 2019-05-17 LAB — APTT
aPTT: 70 seconds — ABNORMAL HIGH (ref 24–36)
aPTT: 68 seconds — ABNORMAL HIGH (ref 24–36)

## 2019-05-17 MED ORDER — ANTICOAGULANT SODIUM CITRATE 4% (200MG/5ML) IV SOLN
5.0000 mL | Freq: Once | Status: AC
  Administered 2019-05-17: 3.2 mL
  Filled 2019-05-17: qty 5

## 2019-05-17 MED ORDER — SODIUM CHLORIDE 0.9 % IV SOLN
0.0700 mg/kg/h | INTRAVENOUS | Status: DC
  Administered 2019-05-18 – 2019-05-21 (×3): 0.07 mg/kg/h via INTRAVENOUS
  Administered 2019-05-23 – 2019-05-25 (×2): 0.05 mg/kg/h via INTRAVENOUS
  Administered 2019-05-27 – 2019-05-28 (×2): 0.07 mg/kg/h via INTRAVENOUS
  Filled 2019-05-17 (×7): qty 250

## 2019-05-17 MED ORDER — LIDOCAINE HCL 1 % IJ SOLN
INTRAMUSCULAR | Status: AC | PRN
  Administered 2019-05-17: 10 mL

## 2019-05-17 MED ORDER — MIDAZOLAM HCL 2 MG/2ML IJ SOLN
INTRAMUSCULAR | Status: AC | PRN
  Administered 2019-05-17: 1 mg via INTRAVENOUS

## 2019-05-17 MED ORDER — GELATIN ABSORBABLE 12-7 MM EX MISC
CUTANEOUS | Status: AC
  Filled 2019-05-17: qty 1

## 2019-05-17 MED ORDER — MIDAZOLAM HCL 2 MG/2ML IJ SOLN
INTRAMUSCULAR | Status: AC
  Filled 2019-05-17: qty 4

## 2019-05-17 MED ORDER — FENTANYL CITRATE (PF) 100 MCG/2ML IJ SOLN
INTRAMUSCULAR | Status: AC | PRN
  Administered 2019-05-17: 50 ug via INTRAVENOUS

## 2019-05-17 MED ORDER — FENTANYL CITRATE (PF) 100 MCG/2ML IJ SOLN
INTRAMUSCULAR | Status: AC
  Filled 2019-05-17: qty 4

## 2019-05-17 MED ORDER — LIDOCAINE HCL 1 % IJ SOLN
INTRAMUSCULAR | Status: AC
  Filled 2019-05-17: qty 20

## 2019-05-17 NOTE — Progress Notes (Signed)
Patient out of the room at this time. RT will check patient when they return.

## 2019-05-17 NOTE — Progress Notes (Addendum)
Responded to consult to obtain IV access so TL CL could be removed as it has been in place for 14 days. Assessed bilateral arms with ultrasound and visualized one appropriate vessel in left arm and possible appropriate vessel in right arm (small). Pt remains on amiodarone at this time and other pressors were turned off a few hours ago. Due to pt's status and need for access, VAST RN recommends that CL remain in place at this time. This RN spoke with Dr. Elsworth Soho who agreed to leave CL in place until tomorrow at which time he is hoping to transition patient to PO meds.  Plan for HD Permacath to be placed this afternoon and temporary femoral HDC to be removed.

## 2019-05-17 NOTE — Procedures (Signed)
Interventional Radiology Procedure Note  Procedure: Placement of a right IJ approach Tunneled HD catheter.  Tip is positioned at the superior cavoatrial junction and catheter is ready for immediate use.  Complications: None Recommendations:  - Ok to use - Do not submerge - Routine care   Signed,  Dulcy Fanny. Earleen Newport, DO

## 2019-05-17 NOTE — Progress Notes (Signed)
Assisted tele visit to patient with partner. ° °Thomas, Josefina Rynders Renee, RN  ° °

## 2019-05-17 NOTE — Progress Notes (Signed)
Assisted tele visit to patient with  significant other.  Zacaria Pousson, Philis Nettle, RN

## 2019-05-17 NOTE — Progress Notes (Signed)
[facility] Progress Note    Assessment/ Plan:   1. AKIpresumably due to ATN with ischemic/nephrotoxic insults worsened by cardiogenic shock. Started on CRRT 04/23/19. Resumed CVVHD2/19/21 due to hyperkalemiawhich has resolved. After trial of CRRT without citrate with clotting x multiple in short time, CRRT was initially held overnight. Then resumed on 2/25 with citrate for clearance.  1. Continue CRRT.  2. Using 4K/0Ca baths for dialysate and post filter fluids due to use of citrate.systemic and post-filter calcium being monitored per protocol 3. Appreciate VIR who are going to place [facility] TC; continue  angiomax until VIR stops it prior to placement of TC. 4. Will plan on CRRT holiday  Wednesday if tolerated -> then challenge with iHD if there are no signs of renal recovery. 2. Cardiogenic shock- Hx milrinone due to development of a fib with rvr and wide complex tachycardia. - Known severe NICM with EF <20% and may not tolerate but also can't continue CRRT indefinitely. - If he doesn't tolerate iHD will need meeting with mother to define goals of care.   3. Leukocytosis - improving last check. abx per primary team. Note femoral catheter. Blood cultures sent 2/25 - listed as NGTD though low volume.CBC ordered 2/28 4. Acute stroke- presumed embolic with bilateral infarcts and LV thrombus- received tpa on admission 5. SVT/polymorphic VT- per Cardiology 6. Acute hypoxic respiratory failure- s/p trach  7. 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 HIT Ab+ on Argatroban. 8. Disposition- overall prognosis is poor with ongoing RRT (has not tolerated stopping CVVHD due to hypercatabolic state and concern for poor tolerance of intermittent HD), trach, as well as cardiogenic shock/cardiomyopathy.  Subjective:   Off milrinone, levo but has not been given  Midodrine yet.  Rt fem non tunneled vasc cath.  Mom bedside and d/w options w/ her as well.   Objective:   BP 92/62    Pulse 78   Temp 97.7 F (36.5 C) (Oral)   Resp (!) 22   Ht 6' 1"  (1.854 m)   Wt 109.3 kg   SpO2 100%   BMI 31.79 kg/m   Intake/Output Summary (Last 24 hours) at 05/17/2019 1137 Last data filed at 05/17/2019 1100 Gross per 24 hour  Intake 3602.28 ml  Output 3625 ml  Net -22.72 ml   Weight change: -1.1 kg  Physical Exam: Gen: awake in bed, trach collar, nonverbal. following commands (I.e. to blink hard, squeezing hand, moving extremities) HEENT - trach in place CVS: S1S2 no rub Resp:reduced breath sounds Abd: soft/ND Ext: he has no edema Access: right femoral nontunneled catheter  Imaging: No results found.  Labs: [employer] Recent Labs  Lab 05/14/19 0428 05/14/19 0443 05/14/19 1510 05/14/19 1510 05/14/19 2144 05/14/19 2150 05/15/19 0435 05/15/19 0441 05/15/19 1650 05/15/19 1713 05/16/19 0544 05/16/19 0552 05/16/19 1633 05/16/19 1639 05/16/19 2204 05/16/19 2207 05/17/19 0156 05/17/19 0159 05/17/19 0511 05/17/19 0636 05/17/19 0639  NA 140   < > 139   < > 139   < > 139   < > 139   < > 140  140   < > 139   < > 139 139 140 139 140 139 139  K 3.5   < > 3.5   < > 3.3*   < > 3.2*   < > 3.6   < > 3.2*  3.3*   < > 3.3*   < > 3.3* 3.3* 3.3* 3.3* 3.1* 3.4* 3.5  CL 99   < > 97*   < >  98   < > 96*   < > 98   < > 97*  96*   < > 96*   < > 93* 94* 91* 93* 93* 93* 93*  CO2 25   < > 27  --  27  --  28  --  27  --  29  --  29  --   --   --   --   --  33*  --   --   GLUCOSE 163*   < > 139*   < > 151*   < > 205*   < > 195*   < > 157*  178*   < > 166*   < > 178* 135* 188* 144* 118* 177* 141*  BUN 44*   < > 39*   < > 38*   < > 32*   < > 32*   < > 31*  26*   < > 25*   < > 23* 27* 26* 29* 28* 22* 28*  CREATININE 3.13*   < > 2.95*   < > 2.95*   < > 2.59*   < > 3.02*   < > 2.61*  2.10*   < > 2.33*   < > 1.90* 2.40* 2.10* 2.50* 2.34* 1.80* 2.30*  CALCIUM 9.0   < > 9.7  --  9.1  --  8.5*  --  8.5*  --  8.7*  --  8.8*  --   --   --   --   --  9.6  --   --   PHOS 4.3  --  4.3  --   --    --  3.7  --  4.0  --  3.8  --  3.6  --   --   --   --   --  4.4  --   --    < > = values in this interval not displayed.   CBC Recent Labs  Lab 05/13/19 1649 05/13/19 1923 05/15/19 0900 05/15/19 1519 05/16/19 1233 05/16/19 1237 05/17/19 0159 05/17/19 0511 05/17/19 0636 05/17/19 0639  WBC 14.4*  --  10.2  --  10.0  --   --  6.8  --   --   HGB 10.0*   < > 9.2*   < > 9.0*   < > 9.2* 8.8* 10.2* 9.5*  HCT 32.6*   < > 29.8*   < > 29.1*   < > 27.0* 28.0* 30.0* 28.0*  MCV 100.9*  --  100.3*  --  101.0*  --   --  100.4*  --   --   PLT 431*  --  354  --  294  --   --  252  --   --    < > = values in this interval not displayed.    Medications:    . sodium chloride   Intravenous Once  . aspirin  81 mg Per Tube Daily  . atorvastatin  40 mg Per Tube q1800  . chlorhexidine gluconate (MEDLINE KIT)  15 mL Mouth Rinse BID  . Chlorhexidine Gluconate Cloth  6 each Topical Q0600  . feeding supplement (PRO-STAT SUGAR FREE 64)  60 mL Per Tube TID  . FLUoxetine  20 mg Per Tube Daily  . insulin aspart  0-20 Units Subcutaneous Q4H  . insulin aspart  10 Units Subcutaneous Q4H  . insulin detemir  35 Units Subcutaneous BID  . mouth rinse  15 mL Mouth Rinse  10 times per day  . midodrine  15 mg Per Tube TID WC  . pantoprazole sodium  40 mg Per Tube Q1200  . QUEtiapine  25 mg Per Tube QHS  . sodium chloride flush  10-40 mL Intracatheter Q12H  . sodium chloride flush  3 mL Intravenous Q12H      Otelia Santee, MD 05/17/2019, 11:37 AM

## 2019-05-17 NOTE — Progress Notes (Signed)
NAME:  Kaito Schulenburg., MRN:  [id number], DOB:  November 24, 1985, LOS: 25 ADMISSION DATE:  04/29/2019, CONSULTATION DATE:  04/19/2019 REFERRING MD:  Dr. Leonel Ramsay, CHIEF COMPLAINT:  Slurred speech  Brief History   34 yo male smoker found to have slurred speech and Rt sided weakness.  Admitted with left MCA CVA and multiple smaller embolic infarcts s/p tPA and thrombectomy in IR.  Additionally found to have left ventricular  thrombus.    Course complicated by septic shock r/t HCAP, AKI requiring CRRT, polymorphic VT and cardiogenic shock.  Intermittent pressor dependence, required tracheostomy  Past Medical History  Systolic CHF with non ischemic CM, Cocaine abuse, OSA, DM Hx of CHF (EF 15%)  Hx myocarditis Smoker 1/2 ppd   Significant Hospital Events   2/05 Admit, tPA, IR thrombectomy 2/6 100% on fvent at 2300  Overnight requiring increasing levophed and phenylephrine.    2/6: switched to levophed, epi, started antibiotics, started on CRRT.  2/9  developed wide-complex tachycardia and hypotension, started on amiodarone drip, increase Levophed drip 2/15 drop in platelets, heparin stopped, bival started 2/17 Hypotensive and back on Levophed 2/18 trach, lines changed 2/19 started milrinone , fever 103 2/20 Episode of wide-complex tachycardia yesterday, changed from Levophed to vasopressin 2/22 stopping vanc. Still on inotrope support. Some AF w/ RVR. Had to be placed back on pressors. ivabradine added 2/23 still on pressors/ CRRT continued 2/24 tmax 98.4, Remains on CRRT, even UF, remains anuric, Levophed at 14 mcg/min, Milrinone at 0.125 mcg/kg/min, Coox 63.4 Doing well this morning on ATC, no events overnight.  Midodrine added   2/25 more interactive, remains on ATC, tmax 99.5/ WBC 21.4, ~900 ml liquid stool/ 24 hours, neg Cdiff, Levophed up to 20 mcg/min, CVP 2, ,milrinone remains at 0.125 mcg/kg/min, coox 92.7, Off CRRT since last night ~2000 s/p clotted x 3 off citrate, restarted    3/1 Amio drip restarted for WCT/atrial flutter, milrinone turned off  Consults:  Neuro IR Cardiology  Nephrology Heart Failure  EP   Procedures:  2/5-2/6:  TPA given at 428 am (total of 90 Mg) 520 am went to IR  S/P Lt common carotid arteriogram followed by complete revascularization of occluded LT MCA sup division mid M2 seg with x 1 pass with 11mx 40 mm solitaire X ret river device and penumbra aspiration with TICI 3 revascularization  ETT 2/05 >> 2/18 [facility] CVL 2/5 >>2/18  RT [place] CVL 2/18 >>3/2 RIJ HD cath 2/6 >>2/18 6 shiley cuffed trach 2/18  >>  RT fem HD 2/19 >>  Significant Diagnostic Tests:  CT angio head/neck 2/05 >> occlusion of Lt MCA bifurcation Echo 2/05 >> EF less than 20%, cannot rule out apical thrombus MRI brain 2/11 > extensive acute infarction of multiple areas without large or medium vessel occlusion Echocardiogram 2/8 Left ventricular ejection fraction, by estimation, is <20%. The left  ventricle has severely decreased function. The left ventricle demonstrates  global hypokinesis. The left ventricular internal cavity size was severely  dilated. Possible small 0.8 x  0.6 cm apical thrombus. Somewhat subtle finding, could confirm with  cardiac MRI. However, if patient has had CVA, would be reasonable to  anticoagulate given severity of LV dysfunction if no contraindications to  anticoagulation.   Micro Data:  SARS CoV2 PCR 2/05 >> negative Influenza PCR 2/05 >> negative Urine 2/6 >> ng resp 2/6 >> nml flora BC 2/6 >>ng resp 2/16 >> [facility] >> negative  BC x 2 2/25 >> Cdiff 2/25 >>  neg  Antimicrobials:  mero 2/6 -2/12 Zosyn 2/6 , 2/19 >> 2/23 vanc 2/6 >> 2/9 , 2/19 >>2/22  Interim history/subjective:   Off milrinone. Remains on trach collar, on CRRT   Objective   Blood pressure 104/73, pulse 77, temperature 97.7 F (36.5 C), temperature source Oral, resp. rate 20, height '6\' 1"'$  (1.854 m), weight 109.3 kg, SpO2 100 %. CVP:  [3 mmHg-13  mmHg] 3 mmHg  FiO2 (%):  [28 %] 28 %   Intake/Output Summary (Last 24 hours) at 05/17/2019 4235 Last data filed at 05/17/2019 0900 Gross per 24 hour  Intake 3677.43 ml  Output 3668 ml  Net 9.43 ml   Filed Weights   05/15/19 0500 05/16/19 0600 05/17/19 0500  Weight: 109.6 kg 110.4 kg 109.3 kg    Examination: General: Well-built, well-nourished, lying in bed in no acute distress, intermittent shivering on CRRT HEENT: [place]/AT trach midline, MM pink/moist, PERRL,  Neuro: Alert and interactive, able to follow simple commands , moves all 4 extremities CV: s1s2 regular rate and rhythm, no murmur, rubs, or gallops,  PULM:  Clear to ascultation bilaterally, no accessory muscle use, mild secretions GI: soft, bowel sounds active in all 4 quadrants, non-tender, non-distended, tolerating TF Extremities: warm/dry, no  edema  Skin: no rashes or lesions  Labs-no leukocytosis, stable anemia, mild hypokalemia and hypocalcemia   Resolved Hospital Problem list     HCAP 2/19- 2/23 zosyn/ vanc  Assessment & Plan:   Left MCA CVA Multiple other infarcts noted on MRI 3/61-[id number] embolic Acute metabolic encephalopathy -resolved P: PT/OT efforts as able , hope removal of femoral line will facilitate Continue nightly Seroquel  Acute systolic HF with cardiogenic shock in setting of NICM, ejection fraction is less than 20% P: Heart failure team following, appreciate assistance Off Levophed Off milrinone   Left ventricle apical thrombus non ischemic cardiomyopathy with systolic congestive heart failure Polymorphic VT on 2/9, WCT 2/19 , 2/28 HLD P:  telemetry  Primary management per cardiology  Continue IV Amiodarone  Daily ASA and Lipitor     Acute hypoxemic respiratory failure now tracheostomy dependent after stroke - last on MV 2/21 Plan Continue aggressive pulmonary hygiene  Trach care , tolerating ATC SLP following , and progress with PMV valve Can consider cuffless trach in the near  future   Acute kidney injury. Still anuric  - CRRT started 2/6 Plan Nephrology following  Permacath planned for today, plan for CRRT holiday then intermittent hemodialysis if remains of Levophed   HIT without thrombosis but needs anticoagulation for embolic CVA - platelets recovered -SRA positive at 90 Plan Avoid heparin Continue Bivalirdin -transition to [place] eventually Trend CBC   DM type II poorly controlled Plan Improved control  CBG 1 40-1 80 acceptable SSI   Hx of cocaine abuse. Depression. - not active per UDS  Plan Continue Prozac   Summary-he has made some progress, liberated off the vent,, off pressors, remains to be seen whether he will tolerate intermittent dialysis He will also need central line to be changed today  Best practice:  Diet: NPO,  TFs DVT prophylaxis: SCDs/ bivalirudin GI prophylaxis: protonix Mobility: progress when able Code Status: full code Disposition: ICU  Family -mother updated at bedside  The patient is critically ill with multiple organ systems failure and requires high complexity decision making for assessment and support, frequent evaluation and titration of therapies, application of advanced monitoring technologies and extensive interpretation of multiple databases. Critical Care Time devoted to patient care services described  in this note independent of APP/resident  time is 32 minutes.    [name] MD. [place]. [facility]  If no response to pager , please call [phone]   05/17/2019

## 2019-05-17 NOTE — Progress Notes (Signed)
ANTICOAGULATION CONSULT NOTE  Pharmacy Consult:  Bivalirudin Indication: stroke 2/5, LV thrombus 2/8, HIT 2/15   Allergies  Allergen Reactions  . Heparin Other (See Comments)    Heparin induced thrombocytopenia. 2/15 HIT OD 1.692. 2/16 SRA positive-90.     Patient Measurements: Height: 6\' 1"  (185.4 cm) Weight: 240 lb 15.4 oz (109.3 kg) IBW/kg (Calculated) : 79.9    Vital Signs: Temp: 97.7 F (36.5 C) (03/02 0803) Temp Source: Oral (03/02 0803) BP: 90/71 (03/02 0730) Pulse Rate: 91 (03/02 0803)  Labs: Recent Labs    05/15/19 0435 05/15/19 0441 05/15/19 0900 05/15/19 1519 05/16/19 0544 05/16/19 0552 05/16/19 1233 05/16/19 1237 05/17/19 0511 05/17/19 0511 05/17/19 0636 05/17/19 0639  HGB  --    < > 9.2*   < > 9.9*   < > 9.0*   < > 8.8*   < > 10.2* 9.5*  HCT  --    < > 29.8*   < > 29.0*   < > 29.1*   < > 28.0*  --  30.0* 28.0*  PLT  --   --  354  --   --   --  294  --  252  --   --   --   APTT 55*  --   --   --  60*  --   --   --  70*  --   --   --   LABPROT  --   --   --   --   --   --   --   --  17.6*  --   --   --   INR  --   --   --   --   --   --   --   --  1.5*  --   --   --   CREATININE 2.59*   < >  --    < > 2.61*  2.10*   < >  --    < > 2.34*  --  1.80* 2.30*   < > = values in this interval not displayed.    Estimated Creatinine Clearance: 58.7 mL/min (A) (by C-G formula based on SCr of 2.3 mg/dL (H)).  Assessment: 34 yr old male with left MCA infarct - received TPA and revascularization with IR on 2/5. On 2/8 found to have small LV apical thrombus on ECHO. Pharmacy consulted to dose IV heparin, which was switched to bivalirudin 2/15 for HIT. 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 SRA very positive at 32. Heparin allergy has appropriately been added to chart and updated with SRA results.   APTT supratherapeutic this morning at 70 seconds on rate of 0.175 mg/kg/hr (36.5 mL/hr). Repeat level at 68 seconds, still  supratherapeutic.Hemoglobin low, but stable. Platelet count WNL. No bleeding per the RN. Patient remains on CRRT.   Patient will undergo TDF placement with bivalirudin infusion held for 2 hours prior to placement and 6 hours following placement. For now, will decrease rate to 0.15 mg/kg/hr.   * Weight adjusted in pump to match actual body weight of 104.4 kg (fluctuating some but this is most consistent weight therefore order specific weight for drip).  Goal of Therapy:  APTT 50- 65 sec  Monitor platelets by anticoagulation protocol: Yes   Plan:  - Decrease bivalirudin to 0.15 mg/kg/hr - Will check aPTT level after restarting bivalirudin IV infusion following TDF placement (6 hours after placement) - Daily aPTT and  CBC - Monitor for s/sx of bleeding  [name], PharmD PGY1 Pharmacy Resident

## 2019-05-17 NOTE — Sedation Documentation (Signed)
Paused Ancef 2gm upon arrival to radiology nurse station.

## 2019-05-17 NOTE — Sedation Documentation (Signed)
Transported patient to IR room. Restarted Ancef 2 gm

## 2019-05-17 NOTE — Progress Notes (Addendum)
Advanced Heart Failure Rounding Note   Subjective:    Yesterday milrinone and norepi stopped.   On CVVHD   Remains on IV amio for A flutter/WCT. Still with some bigeminy.   WBC down to 6.8   BCx 2/25 remain NGTD     Denies pain.    Objective:   Weight Range:  Vital Signs:   Temp:  [97.7 F (36.5 C)-98.4 F (36.9 C)] 97.7 F (36.5 C) (03/02 0803) Pulse Rate:  [29-92] 77 (03/02 0845) Resp:  [12-29] 20 (03/02 0845) BP: (84-141)/(48-125) 104/73 (03/02 0830) SpO2:  [97 %-100 %] 100 % (03/02 0845) FiO2 (%):  [28 %] 28 % (03/02 0803) Weight:  [109.3 kg] 109.3 kg (03/02 0500) Last BM Date: 05/16/19  Weight change: Filed Weights   05/15/19 0500 05/16/19 0600 05/17/19 0500  Weight: 109.6 kg 110.4 kg 109.3 kg    Intake/Output:   Intake/Output Summary (Last 24 hours) at 05/17/2019 1009 Last data filed at 05/17/2019 0900 Gross per 24 hour  Intake 3509.13 ml  Output 3525 ml  Net -15.87 ml    CVP 6  Physical Exam:  General:  NAD in bed. Awake following commands. On trach HEENT: Trach collar Neck: supple. no JVD. Carotids 2+ bilat; no bruits. No lymphadenopathy or thryomegaly appreciated. Cor: PMI nondisplaced. Regular rate & rhythm. No rubs, gallops or murmurs. R subclavian central line.  Lungs: clear no wheeze Abdomen: obese  soft, nontender, nondistended. No hepatosplenomegaly. No bruits or masses. Good bowel sounds. Extremities: no cyanosis, clubbing, rash, edema.. R femoral HD cath.  Neuro: awake on Trach collar. Follows commands. Weaker on R   Telemetry:  SR with PVCs. 70-80s     Labs: Basic Metabolic Panel: Recent Labs  Lab 05/14/19 0428 05/14/19 0443 05/14/19 2144 05/14/19 2150 05/15/19 0435 05/15/19 0441 05/15/19 1650 05/15/19 1713 05/16/19 0544 05/16/19 0552 05/16/19 1633 05/16/19 1639 05/17/19 0156 05/17/19 0159 05/17/19 0511 05/17/19 0636 05/17/19 0639  NA 140   < > 139   < > 139   < > 139   < > 140  140   < > 139   < > 140 139 140  139 139  K 3.5   < > 3.3*   < > 3.2*   < > 3.6   < > 3.2*  3.3*   < > 3.3*   < > 3.3* 3.3* 3.1* 3.4* 3.5  CL 99   < > 98   < > 96*   < > 98   < > 97*  96*   < > 96*   < > 91* 93* 93* 93* 93*  CO2 25   < > 27   < > 28  --  27  --  29  --  29  --   --   --  33*  --   --   GLUCOSE 163*   < > 151*   < > 205*   < > 195*   < > 157*  178*   < > 166*   < > 188* 144* 118* 177* 141*  BUN 44*   < > 38*   < > 32*   < > 32*   < > 31*  26*   < > 25*   < > 26* 29* 28* 22* 28*  CREATININE 3.13*   < > 2.95*   < > 2.59*   < > 3.02*   < > 2.61*  2.10*   < > 2.33*   < >  2.10* 2.50* 2.34* 1.80* 2.30*  CALCIUM 9.0   < > 9.1   < > 8.5*   < > 8.5*   < > 8.7*  --  8.8*  --   --   --  9.6  --   --   MG 2.2  --  2.0  --  1.9  --   --   --  1.9  --   --   --   --   --  2.2  --   --   PHOS 4.3   < >  --   --  3.7  --  4.0  --  3.8  --  3.6  --   --   --  4.4  --   --    < > = values in this interval not displayed.    Liver Function Tests: Recent Labs  Lab 05/15/19 0435 05/15/19 1650 05/16/19 0544 05/16/19 1633 05/17/19 0511  ALBUMIN 2.7* 2.6* 2.6* 2.6* 2.6*   No results for input(s): LIPASE, AMYLASE in the last 168 hours. No results for input(s): AMMONIA in the last 168 hours.  CBC: Recent Labs  Lab 05/12/19 0206 05/12/19 1408 05/13/19 1649 05/13/19 1923 05/15/19 0900 05/15/19 1519 05/16/19 1233 05/16/19 1237 05/17/19 0156 05/17/19 0159 05/17/19 0511 05/17/19 0636 05/17/19 0639  WBC 21.4*  --  14.4*  --  10.2  --  10.0  --   --   --  6.8  --   --   HGB 11.8*   < > 10.0*   < > 9.2*   < > 9.0*   < > 10.2* 9.2* 8.8* 10.2* 9.5*  HCT 38.0*   < > 32.6*   < > 29.8*   < > 29.1*   < > 30.0* 27.0* 28.0* 30.0* 28.0*  MCV 99.2  --  100.9*  --  100.3*  --  101.0*  --   --   --  100.4*  --   --   PLT 625*  --  431*  --  354  --  294  --   --   --  252  --   --    < > = values in this interval not displayed.    Cardiac Enzymes: No results for input(s): CKTOTAL, CKMB, CKMBINDEX, TROPONINI in the last 168  hours.  BNP: BNP (last 3 results) Recent Labs    12/13/18 1750 12/15/18 0405 04/23/19 0945  BNP 1,156.1* 1,161.0* 2,909.9*    ProBNP (last 3 results) No results for input(s): PROBNP in the last 8760 hours.    Other results:  Imaging: No results found.   Medications:     Scheduled Medications: . sodium chloride   Intravenous Once  . aspirin  81 mg Per Tube Daily  . atorvastatin  40 mg Per Tube q1800  . chlorhexidine gluconate (MEDLINE KIT)  15 mL Mouth Rinse BID  . Chlorhexidine Gluconate Cloth  6 each Topical Q0600  . feeding supplement (PRO-STAT SUGAR FREE 64)  60 mL Per Tube TID  . FLUoxetine  20 mg Per Tube Daily  . insulin aspart  0-20 Units Subcutaneous Q4H  . insulin aspart  10 Units Subcutaneous Q4H  . insulin detemir  35 Units Subcutaneous BID  . mouth rinse  15 mL Mouth Rinse 10 times per day  . midodrine  15 mg Per Tube TID WC  . pantoprazole sodium  40 mg Per Tube Q1200  . QUEtiapine  25 mg Per Tube QHS  .  sodium chloride flush  10-40 mL Intracatheter Q12H  . sodium chloride flush  3 mL Intravenous Q12H    Infusions: . sodium chloride Stopped (05/04/19 0200)  . sodium chloride    . amiodarone 30 mg/hr (05/17/19 0900)  . anticoagulant sodium citrate    . bivalirudin (ANGIOMAX) infusion 0.5 mg/mL (Non-ACS indications) 0.175 mg/kg/hr (05/17/19 0900)  . calcium gluconate infusion for CRRT 20 g (05/17/19 0738)  .  ceFAZolin (ANCEF) IV    . feeding supplement (VITAL AF 1.2 CAL) Stopped (05/17/19 0023)  . norepinephrine (LEVOPHED) Adult infusion Stopped (05/17/19 0007)  . prismasol B22GK 4/0 300 mL/hr at 05/16/19 2340  . prismasol B22GK 4/0 2,000 mL/hr at 05/17/19 0800  . sodium citrate 2 %/dextrose 2.5% solution 3000 mL 350 mL/hr at 05/17/19 0942    PRN Medications: sodium chloride, sodium chloride, anticoagulant sodium citrate, bisacodyl, docusate, fentaNYL (SUBLIMAZE) injection, sodium chloride flush, sodium chloride flush   Assessment/Plan:    1. Acute on chronic systolic HF due to NICM-> cardiogenic shock - Echo EF < 20% - Off Norepi and milrinone. CO-OX 52%.  - Continue midodrine at 15 tid.  2. AKI -> ESRD - started on CRRT 04/23/19 -making some urine per nursing staff but but not captured due to incontinence.   - if unable to get him to tolerate iHD of pressors would be a palliative situation 3. Acute CVA with severe residual deficits due to extensive R cerebellar, midbrain, R MCA and L MCA infarcts  - likely cardioembolic - s/p clot extraction 4. Acute hypoxic respiratory failure - s/p trach  - stable on trac collar 28% 5. Wide complex tachycardia/frequent PVCs/ventricular bigeminy - continues with frequent bigeminy - had recurrent atrial tach/flutter. Ivabradine was stopped. .  - - Continue IV amiodarone.   - Keep K >4.0, Mg >2.0 - K 3.5. Supp K.   - no change. No b-blocker with shock 6. Leukocytosis - high risk for sepsis - remains AF - WBC 6.8  - cultures redrawn 2/25 remain NGTD  Length of Stay: [place]  NP-C  05/17/2019, 10:09 AM  Advanced Heart Failure Team Pager [phone] (M-F; [place])  Please contact [facility] for night-coverage after hours (4p -7a ) and weekends on [email or web address]   Patient seen and examined with the above-signed Advanced Practice Provider and/or Housestaff. I personally reviewed laboratory data, imaging studies and relevant notes. I independently examined the patient and formulated the important aspects of the plan. I have edited the note to reflect any of my changes or salient points. I have personally discussed the plan with the patient and/or family.  Milrinone and NE stopped yesterday. Given some fluid back. CVP now 5-6. SBP > 100. Co-ox 62%. Rhythm stable on IV amio. Getting tunneled cath for HD today.   Currently stable off inotropes. Will need to see if he can tolerated HD. Continue IV amio for now to stabilize rhythm.  Glori Bickers, MD  3:06 PM

## 2019-05-17 NOTE — Progress Notes (Signed)
Assisted tele visit to patient with significant other.  Prudence Heiny, Philis Nettle, RN

## 2019-05-18 DIAGNOSIS — N179 Acute kidney failure, unspecified: Secondary | ICD-10-CM | POA: Diagnosis not present

## 2019-05-18 DIAGNOSIS — R570 Cardiogenic shock: Secondary | ICD-10-CM | POA: Diagnosis not present

## 2019-05-18 DIAGNOSIS — N170 Acute kidney failure with tubular necrosis: Secondary | ICD-10-CM | POA: Diagnosis not present

## 2019-05-18 DIAGNOSIS — J9600 Acute respiratory failure, unspecified whether with hypoxia or hypercapnia: Secondary | ICD-10-CM | POA: Diagnosis not present

## 2019-05-18 DIAGNOSIS — Z930 Tracheostomy status: Secondary | ICD-10-CM | POA: Diagnosis not present

## 2019-05-18 LAB — GLUCOSE, CAPILLARY
Glucose-Capillary: 124 mg/dL — ABNORMAL HIGH (ref 70–99)
Glucose-Capillary: 145 mg/dL — ABNORMAL HIGH (ref 70–99)
Glucose-Capillary: 153 mg/dL — ABNORMAL HIGH (ref 70–99)
Glucose-Capillary: 163 mg/dL — ABNORMAL HIGH (ref 70–99)
Glucose-Capillary: 166 mg/dL — ABNORMAL HIGH (ref 70–99)
Glucose-Capillary: 171 mg/dL — ABNORMAL HIGH (ref 70–99)

## 2019-05-18 LAB — RENAL FUNCTION PANEL
Albumin: 2.4 g/dL — ABNORMAL LOW (ref 3.5–5.0)
Albumin: 2.4 g/dL — ABNORMAL LOW (ref 3.5–5.0)
Anion gap: 14 (ref 5–15)
Anion gap: 15 (ref 5–15)
BUN: 46 mg/dL — ABNORMAL HIGH (ref 6–20)
BUN: 64 mg/dL — ABNORMAL HIGH (ref 6–20)
CO2: 30 mmol/L (ref 22–32)
CO2: 27 mmol/L (ref 22–32)
Calcium: 9.1 mg/dL (ref 8.9–10.3)
Calcium: 9.2 mg/dL (ref 8.9–10.3)
Chloride: 94 mmol/L — ABNORMAL LOW (ref 98–111)
Chloride: 97 mmol/L — ABNORMAL LOW (ref 98–111)
Creatinine, Ser: 3.82 mg/dL — ABNORMAL HIGH (ref 0.61–1.24)
Creatinine, Ser: 4.92 mg/dL — ABNORMAL HIGH (ref 0.61–1.24)
GFR calc Af Amer: 22 mL/min — ABNORMAL LOW (ref >60)
GFR calc Af Amer: 17 mL/min — ABNORMAL LOW (ref >60)
GFR calc non Af Amer: 19 mL/min — ABNORMAL LOW (ref >60)
GFR calc non Af Amer: 14 mL/min — ABNORMAL LOW (ref >60)
Glucose, Bld: 121 mg/dL — ABNORMAL HIGH (ref 70–99)
Glucose, Bld: 156 mg/dL — ABNORMAL HIGH (ref 70–99)
Phosphorus: 6 mg/dL — ABNORMAL HIGH (ref 2.5–4.6)
Phosphorus: 5.7 mg/dL — ABNORMAL HIGH (ref 2.5–4.6)
Potassium: 2.9 mmol/L — ABNORMAL LOW (ref 3.5–5.1)
Potassium: 3.1 mmol/L — ABNORMAL LOW (ref 3.5–5.1)
Sodium: 138 mmol/L (ref 135–145)
Sodium: 139 mmol/L (ref 135–145)

## 2019-05-18 LAB — MAGNESIUM: Magnesium: 1.9 mg/dL (ref 1.7–2.4)

## 2019-05-18 LAB — CBC
HCT: 27.4 % — ABNORMAL LOW (ref 39.0–52.0)
Hemoglobin: 8.3 g/dL — ABNORMAL LOW (ref 13.0–17.0)
MCH: 30.5 pg (ref 26.0–34.0)
MCHC: 30.3 g/dL (ref 30.0–36.0)
MCV: 100.7 fL — ABNORMAL HIGH (ref 80.0–100.0)
Platelets: 223 10*3/uL (ref 150–400)
RBC: 2.72 MIL/uL — ABNORMAL LOW (ref 4.22–5.81)
RDW: 14.3 % (ref 11.5–15.5)
WBC: 6.2 10*3/uL (ref 4.0–10.5)
nRBC: 0 % (ref 0.0–0.2)

## 2019-05-18 LAB — COOXEMETRY PANEL
Carboxyhemoglobin: 2.2 % — ABNORMAL HIGH (ref 0.5–1.5)
Methemoglobin: 1 % (ref 0.0–1.5)
O2 Saturation: 65.3 %
Total hemoglobin: 8.8 g/dL — ABNORMAL LOW (ref 12.0–16.0)

## 2019-05-18 LAB — APTT
aPTT: 72 seconds — ABNORMAL HIGH (ref 24–36)
aPTT: 77 seconds — ABNORMAL HIGH (ref 24–36)
aPTT: 59 seconds — ABNORMAL HIGH (ref 24–36)
aPTT: 57 seconds — ABNORMAL HIGH (ref 24–36)

## 2019-05-18 LAB — CALCIUM, IONIZED: Calcium, Ionized, Serum: 4.6 mg/dL (ref 4.5–5.6)

## 2019-05-18 MED ORDER — AMIODARONE HCL 200 MG PO TABS
200.0000 mg | ORAL_TABLET | Freq: Two times a day (BID) | ORAL | Status: DC
  Filled 2019-05-18: qty 1

## 2019-05-18 MED ORDER — AMIODARONE HCL 200 MG PO TABS
200.0000 mg | ORAL_TABLET | Freq: Two times a day (BID) | ORAL | Status: DC
  Administered 2019-05-18 – 2019-06-20 (×64): 200 mg
  Filled 2019-05-18 (×63): qty 1

## 2019-05-18 MED ORDER — INSULIN ASPART 100 UNIT/ML ~~LOC~~ SOLN
5.0000 [IU] | SUBCUTANEOUS | Status: DC
  Administered 2019-05-18 – 2019-06-01 (×82): 5 [IU] via SUBCUTANEOUS

## 2019-05-18 MED ORDER — POTASSIUM CHLORIDE 20 MEQ/15ML (10%) PO SOLN
20.0000 meq | Freq: Once | ORAL | Status: AC
  Administered 2019-05-18: 20 meq
  Filled 2019-05-18: qty 15

## 2019-05-18 MED ORDER — INSULIN DETEMIR 100 UNIT/ML ~~LOC~~ SOLN
25.0000 [IU] | Freq: Two times a day (BID) | SUBCUTANEOUS | Status: DC
  Administered 2019-05-18 – 2019-05-25 (×15): 25 [IU] via SUBCUTANEOUS
  Filled 2019-05-18 (×17): qty 0.25

## 2019-05-18 NOTE — Evaluation (Signed)
Physical Therapy Evaluation Patient Details Name: Carlos Brewer. MRN: [id number] DOB: Jan 21, 1986 Today's Date: 05/18/2019   History of Present Illness  Pt is a 34 y/o male smoker who initially presented on 2/5 with slurred speech and Rt sided weakness. Admitted with left MCA CVA and multiple smaller embolic infarcts s/p tPA and thrombectomy in IR.  Additionally found to have left ventricular  thrombus. Hospital course complicated by septic shock r/t HCAP, AKI requiring CRRT, polymorphic VT and cardiogenic shock. Echo EF < 20%. Pt with Intermittent pressor dependence, required tracheostomy.   Clinical Impression  Patient presents with mobility limited due to generalized weakness, decreased balance, decreased activity tolerance, decreased cognition and he will benefit from skilled PT in the acute setting to allow return home with family support following CIR level rehab stay.  He was able to sit EOB for about 20 minutes and participated in LE and UE therex and in balance activities.  He was previously independent living with girlfriend and enjoyed motorcycles.      Follow Up Recommendations CIR;Supervision/Assistance - 24 hour    Equipment Recommendations  Other (comment)(TBA)    Recommendations for Other Services Rehab consult     Precautions / Restrictions Precautions Precautions: Fall Precaution Comments: flexiseal, NG      Mobility  Bed Mobility Overal bed mobility: Needs Assistance Bed Mobility: Rolling;Sidelying to Sit;Sit to Sidelying Rolling: Min guard Sidelying to sit: Max assist;+2 for safety/equipment     Sit to sidelying: Max assist;+2 for safety/equipment General bed mobility comments: assist for LEs and trunk, cues for pt to self assist with transitions  Transfers                 General transfer comment: deferred, pt declined attempts today  Ambulation/Gait                Stairs            Wheelchair Mobility    Modified Rankin  (Stroke Patients Only) Modified Rankin (Stroke Patients Only) Pre-Morbid Rankin Score: No symptoms Modified Rankin: Severe disability     Balance Overall balance assessment: Needs assistance Sitting-balance support: Feet supported;Single extremity supported Sitting balance-Leahy Scale: Fair Sitting balance - Comments: pt able to maintain balance with close minguard assist, intermittent minA. Pt testing out balance while upright, swaying laterally, posteriorly, and anteriorly but for the majority is able to return to midline without physical assist; sat EOB about 20 minutes                                     Pertinent Vitals/Pain Pain Assessment: No/denies pain    Home Living Family/patient expects to be discharged to:: Private residence Living Arrangements: Spouse/significant other Available Help at Discharge: Family(mother hopeful for pt to d/c to her home with her and pt's father) Type of Home: House Home Access: Stairs to enter   [employer] of Steps: 1 Home Layout: Two level;Able to live on main level with bedroom/bathroom Home Equipment: None      Prior Function Level of Independence: Independent         Comments: driving, not working, pt's mother hopeful for pt to return home with her     Hand Dominance        Extremity/Trunk Assessment   Upper Extremity Assessment Upper Extremity Assessment: Defer to OT evaluation    Lower Extremity Assessment Lower Extremity Assessment: RLE deficits/detail;LLE deficits/detail RLE Deficits /  Details: AAROM WFL, strength hip flexion 2/5, knee extension 4-/5, ankle DF 3/5 RLE Coordination: decreased fine motor;decreased gross motor LLE Deficits / Details: AAROM WFL, strength hip flexion 2/5, knee extension 4-/5 ankle DF 3/5 LLE Coordination: decreased gross motor;decreased fine motor       Communication   Communication: Tracheostomy(has PMSV, but no verbalizations)  Cognition  Arousal/Alertness: Awake/alert Behavior During Therapy: Flat affect;WFL for tasks assessed/performed Overall Cognitive Status: Difficult to assess                                 General Comments: pt following approx 75% of simple commands, intermittently requires cues/repetition to do so. Applied PMSV during session, pt with few attempts to vocalize but requires max encouragement to do so      General Comments General comments (skin integrity, edema, etc.): VSS on trach collar while wearing PMSV through half the session, mother present and supportive    Exercises General Exercises - Lower Extremity Long Arc Quad: AROM;Both;5 reps Other Exercises Other Exercises: reaching with bil UEs seated EOB, requires active assist to perform Other Exercises: scap squeezes x 3   Assessment/Plan    PT Assessment Patient needs continued PT services  PT Problem List Decreased strength;Decreased activity tolerance;Decreased mobility;Decreased cognition;Decreased safety awareness;Cardiopulmonary status limiting activity;Decreased knowledge of precautions;Decreased knowledge of use of DME;Decreased coordination;Decreased balance       PT Treatment Interventions DME instruction;Therapeutic activities;Balance training;Functional mobility training;Therapeutic exercise;Neuromuscular re-education;Patient/family education;Gait training    PT Goals (Current goals can be found in the Care Plan section)  Acute Rehab PT Goals Patient Stated Goal: to d/c home with mother after rehab PT Goal Formulation: With patient/family Time For Goal Achievement: 06/01/19 Potential to Achieve Goals: Good    Frequency Min 4X/week   Barriers to discharge        Co-evaluation PT/OT/SLP Co-Evaluation/Treatment: Yes Reason for Co-Treatment: Complexity of the patient's impairments (multi-system involvement);Necessary to address cognition/behavior during functional activity;For patient/therapist safety;To  address functional/ADL transfers PT goals addressed during session: Mobility/safety with mobility;Balance;Strengthening/ROM         AM-PAC PT "6 Clicks" Mobility  Outcome Measure Help needed turning from your back to your side while in a flat bed without using bedrails?: A Little Help needed moving from lying on your back to sitting on the side of a flat bed without using bedrails?: A Lot Help needed moving to and from a bed to a chair (including a wheelchair)?: Total Help needed standing up from a chair using your arms (e.g., wheelchair or bedside chair)?: Total Help needed to walk in hospital room?: Total Help needed climbing 3-5 steps with a railing? : Total 6 Click Score: 9    End of Session   Activity Tolerance: Patient tolerated treatment well Patient left: in bed;with call bell/phone within reach   PT Visit Diagnosis: Other abnormalities of gait and mobility (R26.89);Muscle weakness (generalized) (M62.81);Other symptoms and signs involving the nervous system (R29.898)    Time: [id number] PT Time Calculation (min) (ACUTE ONLY): 43 min   Charges:   PT Evaluation $PT Eval Moderate Complexity: [place], [place] Acute Rehabilitation Services [phone] 05/18/2019   Reginia Naas 05/18/2019, 5:12 PM

## 2019-05-18 NOTE — Evaluation (Addendum)
Occupational Therapy Evaluation Patient Details Name: Carlos Brewer. MRN: [id number] DOB: 1985/07/22 Today's Date: 05/18/2019    History of Present Illness Pt is a 34 y/o male smoker who initially presented on 2/5 with slurred speech and Rt sided weakness. Admitted with left MCA CVA and multiple smaller embolic infarcts s/p tPA and thrombectomy in IR.  Additionally found to have left ventricular  thrombus. Hospital course complicated by septic shock r/t HCAP, AKI requiring CRRT, polymorphic VT and cardiogenic shock. Echo EF < 20%. Pt with Intermittent pressor dependence, required tracheostomy.    Clinical Impression   This 34 y/o male presents with the above. PTA pt independent with ADL, iADL and functional mobility. PLOF obtained from pt's mother as pt currently with trach, applied PMSV however pt with only minimal attempts to vocalize. Pt presenting with weakness (R>L side), impaired balance, and cognitive impairments. Overall pt tolerating PT/OT session well today, tolerating sitting EOB >10 min while completed simple grooming ADL and simple ROM to bil UE/LE. Pt requiring two person assist for safe completion of mobility tasks; was able to maintain sitting balance intermittently with close minguard assist, completing simple grooming ADL using dominant RUE with minA. Pt's mother present and supportive throughout. Pt will benefit from continued acute OT services and feel he is an excellent candidate for CIR level therapies at time of discharge to progress pt towards his PLOF. Will continue to follow.     Follow Up Recommendations  CIR;Supervision/Assistance - 24 hour    Equipment Recommendations  Other (comment)(TBD)           Precautions / Restrictions Precautions Precautions: Fall Precaution Comments: flexiseal, NG Restrictions Weight Bearing Restrictions: No      Mobility Bed Mobility Overal bed mobility: Needs Assistance Bed Mobility: Rolling;Sidelying to Sit;Sit to  Sidelying Rolling: Mod assist Sidelying to sit: Max assist;+2 for safety/equipment     Sit to sidelying: Max assist;+2 for safety/equipment General bed mobility comments: assist for LEs and trunk, cues for pt to self assist with transitions  Transfers                 General transfer comment: deferred, pt declined attempts today    Balance Overall balance assessment: Needs assistance Sitting-balance support: Feet supported;Single extremity supported Sitting balance-Leahy Scale: Fair Sitting balance - Comments: pt able to maintain balance with close minguard assist, intermittent minA. Pt testing out balance while upright, swaying laterally, posteriorly, and anteriorly but for the majority is able to return to midline without physical assist                                    ADL either performed or assessed with clinical judgement   ADL Overall ADL's : Needs assistance/impaired Eating/Feeding: NPO   Grooming: Minimal assistance;Wash/dry face Grooming Details (indicate cue type and reason): assist to support R elbow  Upper Body Bathing: Moderate assistance;Sitting   Lower Body Bathing: Maximal assistance;+2 for physical assistance;+2 for safety/equipment;Sitting/lateral leans   Upper Body Dressing : Moderate assistance;Sitting   Lower Body Dressing: Maximal assistance;Total assistance;+2 for physical assistance;+2 for safety/equipment;Sitting/lateral leans Lower Body Dressing Details (indicate cue type and reason): totalA to don socks               General ADL Comments: pt tolerated sitting EOB >10 min during session. Presenting with UB/LB weakness, decreased sitting balance, impaired cognition  Pertinent Vitals/Pain Pain Assessment: No/denies pain     Hand Dominance     Extremity/Trunk Assessment Upper Extremity Assessment Upper Extremity Assessment: Generalized weakness;RUE deficits/detail;LUE  deficits/detail RUE Deficits / Details: Notable weakness bil UEs. RUE weakness > LUE RUE Coordination: decreased fine motor;decreased gross motor LUE Deficits / Details: grossly 2-3/5 throughout LUE Coordination: decreased fine motor;decreased gross motor   Lower Extremity Assessment Lower Extremity Assessment: Defer to PT evaluation       Communication Communication Communication: Tracheostomy(has PMSV but pt with minimal vocalizations)   Cognition Arousal/Alertness: Awake/alert Behavior During Therapy: Flat affect;WFL for tasks assessed/performed Overall Cognitive Status: Difficult to assess                                 General Comments: pt following approx 75% of simple commands, intermittently requires cues/repetition to do so. Applied PMSV during session, pt with few attempts to vocalize but requires max encouragement to do so   General Comments  VSS on trach collar. Pt's mother present and supportive throughout    Exercises Exercises: Other exercises;General Lower Extremity General Exercises - Lower Extremity Long Arc Quad: AROM;Both;5 reps Other Exercises Other Exercises: reaching with bil UEs seated EOB, requires active assist to perform   Shoulder Instructions      Home Living Family/patient expects to be discharged to:: Private residence Living Arrangements: Spouse/significant other Available Help at Discharge: Family(mother hopeful for d/c to her house with her/father) Type of Home: House Home Access: Stairs to enter [employer] of Steps: 1   Home Layout: Two level;Able to live on main level with bedroom/bathroom Alternate Level Stairs-Number of Steps: 10, landing, 4    Bathroom Shower/Tub: Teacher, early years/pre: Standard     Home Equipment: None          Prior Functioning/Environment Level of Independence: Independent        Comments: driving, not working, pt's mother hopeful for pt to return home with her         OT Problem List: Decreased strength;Decreased range of motion;Decreased activity tolerance;Impaired balance (sitting and/or standing);Decreased cognition;Decreased safety awareness;Decreased knowledge of use of DME or AE;Impaired UE functional use;Decreased coordination;Cardiopulmonary status limiting activity      OT Treatment/Interventions: Self-care/ADL training;Therapeutic exercise;Neuromuscular education;Energy conservation;DME and/or AE instruction;Therapeutic activities;Cognitive remediation/compensation;Visual/perceptual remediation/compensation;Patient/family education;Balance training    OT Goals(Current goals can be found in the care plan section) Acute Rehab OT Goals Patient Stated Goal: none stated, agreeable to working with therapies OT Goal Formulation: With patient Time For Goal Achievement: 06/01/19 Potential to Achieve Goals: Good  OT Frequency: Min 2X/week(will benefit from 3x)   Barriers to D/C:            Co-evaluation PT/OT/SLP Co-Evaluation/Treatment: Yes Reason for Co-Treatment: Complexity of the patient's impairments (multi-system involvement);For patient/therapist safety;To address functional/ADL transfers;Necessary to address cognition/behavior during functional activity   OT goals addressed during session: ADL's and self-care;Strengthening/ROM      AM-PAC OT "6 Clicks" Daily Activity     Outcome Measure Help from another person eating meals?: Total(NPO) Help from another person taking care of personal grooming?: A Lot Help from another person toileting, which includes using toliet, bedpan, or urinal?: Total Help from another person bathing (including washing, rinsing, drying)?: A Lot Help from another person to put on and taking off regular upper body clothing?: A Lot Help from another person to put on and taking off regular lower body clothing?: Total 6  Click Score: 9   End of Session Equipment Utilized During Treatment: Oxygen Nurse  Communication: Mobility status  Activity Tolerance: Patient tolerated treatment well Patient left: in bed;with call bell/phone within reach;with family/visitor present  OT Visit Diagnosis: Muscle weakness (generalized) (M62.81);Other abnormalities of gait and mobility (R26.89)                Time: [id number] OT Time Calculation (min): 43 min Charges:  OT General Charges $OT Visit: 1 Visit OT Evaluation $OT Eval Moderate Complexity: 1 Mod OT Treatments $Self Care/Home Management : 8-22 mins  Lou Cal, OT Acute Rehabilitation Services Pager [phone] Office [phone]   Raymondo Band 05/18/2019, 3:10 PM

## 2019-05-18 NOTE — Progress Notes (Addendum)
Advanced Heart Failure Rounding Note   Subjective:    IV amio stopped earlier today and placed on po amio 200 mg twice a day.   Working with therapy. Denies SOB/pain.    Objective:   Weight Range:  Vital Signs:   Temp:  [97.9 F (36.6 C)-99 F (37.2 C)] 98.6 F (37 C) (03/03 1200) Pulse Rate:  [54-111] 88 (03/03 1144) Resp:  [11-24] 16 (03/03 1144) BP: (92-121)/(47-78) 104/69 (03/03 1144) SpO2:  [93 %-100 %] 100 % (03/03 1144) FiO2 (%):  [21 %-28 %] 21 % (03/03 1144) Weight:  [110.1 kg] 110.1 kg (03/03 0500) Last BM Date: 05/17/19  Weight change: Filed Weights   05/16/19 0600 05/17/19 0500 05/18/19 0500  Weight: 110.4 kg 109.3 kg 110.1 kg    Intake/Output:   Intake/Output Summary (Last 24 hours) at 05/18/2019 1251 Last data filed at 05/18/2019 1030 Gross per 24 hour  Intake 1740.93 ml  Output 825 ml  Net 915.93 ml    CVP 5-6  Physical Exam: General:  Sitting on the side of the bed with therapy.  No resp difficulty HEENT: normal anicteric Neck: + trach,supple. no JVD. Carotids 2+ bilat; no bruits. No lymphadenopathy or thryomegaly appreciated. RIJ HD catheter Cor: PMI nondisplaced. Regular rate & rhythm. No rubs, gallops or murmurs. Lungs: clear no wheeze  Abdomen: obese soft, nontender, nondistended. No hepatosplenomegaly. No bruits or masses. Good bowel sounds. Extremities: no cyanosis, clubbing, rash, edema Neuro: Trach, alert, follows commands. cranial nerves grossly intact. moves all 4 extremities w/o difficulty.  Telemetry:  SR with occasional PVCs 70-80s Personally reviewed    Labs: Basic Metabolic Panel: Recent Labs  Lab 05/14/19 2144 05/14/19 2150 05/15/19 0435 05/15/19 0441 05/16/19 0544 05/16/19 0552 05/16/19 1633 05/16/19 1639 05/17/19 0511 05/17/19 0636 05/17/19 0639 05/17/19 1705 05/18/19 0304  NA 139   < > 139   < > 140  140   < > 139   < > 140 139 139 140 138  K 3.3*   < > 3.2*   < > 3.2*  3.3*   < > 3.3*   < > 3.1* 3.4* 3.5  3.2* 2.9*  CL 98   < > 96*   < > 97*  96*   < > 96*   < > 93* 93* 93* 97* 94*  CO2 27   < > 28   < > 29  --  29  --  33*  --   --  30 30  GLUCOSE 151*   < > 205*   < > 157*  178*   < > 166*   < > 118* 177* 141* 106* 121*  BUN 38*   < > 32*   < > 31*  26*   < > 25*   < > 28* 22* 28* 25* 46*  CREATININE 2.95*   < > 2.59*   < > 2.61*  2.10*   < > 2.33*   < > 2.34* 1.80* 2.30* 2.65* 3.82*  CALCIUM 9.1   < > 8.5*   < > 8.7*   < > 8.8*   < > 9.6  --   --  9.3 9.1  MG 2.0  --  1.9  --  1.9  --   --   --  2.2  --   --   --  1.9  PHOS  --   --  3.7   < > 3.8  --  3.6  --  4.4  --   --  4.9* 6.0*   < > = values in this interval not displayed.    Liver Function Tests: Recent Labs  Lab 05/16/19 0544 05/16/19 1633 05/17/19 0511 05/17/19 1705 05/18/19 0304  ALBUMIN 2.6* 2.6* 2.6* 2.6* 2.4*   No results for input(s): LIPASE, AMYLASE in the last 168 hours. No results for input(s): AMMONIA in the last 168 hours.  CBC: Recent Labs  Lab 05/13/19 1649 05/13/19 1923 05/15/19 0900 05/15/19 1519 05/16/19 1233 05/16/19 1237 05/17/19 0159 05/17/19 0511 05/17/19 0636 05/17/19 0639 05/18/19 0304  WBC 14.4*  --  10.2  --  10.0  --   --  6.8  --   --  6.2  HGB 10.0*   < > 9.2*   < > 9.0*   < > 9.2* 8.8* 10.2* 9.5* 8.3*  HCT 32.6*   < > 29.8*   < > 29.1*   < > 27.0* 28.0* 30.0* 28.0* 27.4*  MCV 100.9*  --  100.3*  --  101.0*  --   --  100.4*  --   --  100.7*  PLT 431*  --  354  --  294  --   --  252  --   --  223   < > = values in this interval not displayed.    Cardiac Enzymes: No results for input(s): CKTOTAL, CKMB, CKMBINDEX, TROPONINI in the last 168 hours.  BNP: BNP (last 3 results) Recent Labs    12/13/18 1750 12/15/18 0405 04/23/19 0945  BNP 1,156.1* 1,161.0* 2,909.9*    ProBNP (last 3 results) No results for input(s): PROBNP in the last 8760 hours.    Other results:  Imaging: IR Fluoro Guide CV Line Right  Result Date: 05/17/2019 INDICATION: 34 year old male  referred for tunneled hemodialysis catheter EXAM: TUNNELED CENTRAL VENOUS HEMODIALYSIS CATHETER PLACEMENT WITH ULTRASOUND AND FLUOROSCOPIC GUIDANCE MEDICATIONS: 2 g Ancef. The antibiotic was given in an appropriate time interval prior to skin puncture. ANESTHESIA/SEDATION: Moderate (conscious) sedation was employed during this procedure. A total of Versed 1.0 mg and Fentanyl 50 mcg was administered intravenously. Moderate Sedation Time: 19 minutes. The patient's level of consciousness and vital signs were monitored continuously by radiology nursing throughout the procedure under my direct supervision. FLUOROSCOPY TIME:  Fluoroscopy Time: 1 minutes 0 seconds (45 mGy). COMPLICATIONS: None PROCEDURE: Informed written consent was obtained from the patient after a discussion of the risks, benefits, and alternatives to treatment. Questions regarding the procedure were encouraged and answered. The right neck and chest were prepped with chlorhexidine in a sterile fashion, and a sterile drape was applied covering the operative field. Maximum barrier sterile technique with sterile gowns and gloves were used for the procedure. A timeout was performed prior to the initiation of the procedure. After creating a small venotomy incision, a micropuncture kit was utilized to access the right internal jugular vein under direct, real-time ultrasound guidance after the overlying soft tissues were anesthetized with 1% lidocaine with epinephrine. Ultrasound image documentation was performed. The microwire was marked to measure appropriate internal catheter length. External tunneled length was estimated. A total tip to cuff length of 19 cm was selected. Skin and subcutaneous tissues of chest wall below the clavicle were generously infiltrated with 1% lidocaine for local anesthesia. A small stab incision was made with 11 blade scalpel. The selected hemodialysis catheter was tunneled in a retrograde fashion from the anterior chest wall to  the venotomy incision. A guidewire was advanced to the level of the IVC and the micropuncture sheath was  exchanged for a peel-away sheath. The catheter was then placed through the peel-away sheath with tips ultimately positioned within the superior aspect of the right atrium. Final catheter positioning was confirmed and documented with a spot radiographic image. The catheter aspirates and flushes normally. The catheter was flushed with appropriate volume heparin dwells. The catheter exit site was secured with a 0-Prolene retention suture. The venotomy incision was closed Derma bond and sterile dressing. Dressings were applied at the chest wall. Patient tolerated the procedure well and remained hemodynamically stable throughout. No complications were encountered and no significant blood loss encountered. IMPRESSION: Status post right IJ tunneled hemodialysis catheter. Signed, Dulcy Fanny. Dellia Nims, RPVI [facility] Rad[facility] Radiology Electronically Signed   By: Corrie Mckusick D.O.   On: 05/17/2019 16:33   IR US Guide Vasc Access Right  Result Date: 05/17/2019 INDICATION: 35 year old male referred for tunneled hemodialysis catheter EXAM: TUNNELED CENTRAL VENOUS HEMODIALYSIS CATHETER PLACEMENT WITH ULTRASOUND AND FLUOROSCOPIC GUIDANCE MEDICATIONS: 2 g Ancef. The antibiotic was given in an appropriate time interval prior to skin puncture. ANESTHESIA/SEDATION: Moderate (conscious) sedation was employed during this procedure. A total of Versed 1.0 mg and Fentanyl 50 mcg was administered intravenously. Moderate Sedation Time: 19 minutes. The patient's level of consciousness and vital signs were monitored continuously by radiology nursing throughout the procedure under my direct supervision. FLUOROSCOPY TIME:  Fluoroscopy Time: 1 minutes 0 seconds (45 mGy). COMPLICATIONS: None PROCEDURE: Informed written consent was obtained from the patient after a discussion of the risks, benefits,  and alternatives to treatment. Questions regarding the procedure were encouraged and answered. The right neck and chest were prepped with chlorhexidine in a sterile fashion, and a sterile drape was applied covering the operative field. Maximum barrier sterile technique with sterile gowns and gloves were used for the procedure. A timeout was performed prior to the initiation of the procedure. After creating a small venotomy incision, a micropuncture kit was utilized to access the right internal jugular vein under direct, real-time ultrasound guidance after the overlying soft tissues were anesthetized with 1% lidocaine with epinephrine. Ultrasound image documentation was performed. The microwire was marked to measure appropriate internal catheter length. External tunneled length was estimated. A total tip to cuff length of 19 cm was selected. Skin and subcutaneous tissues of chest wall below the clavicle were generously infiltrated with 1% lidocaine for local anesthesia. A small stab incision was made with 11 blade scalpel. The selected hemodialysis catheter was tunneled in a retrograde fashion from the anterior chest wall to the venotomy incision. A guidewire was advanced to the level of the IVC and the micropuncture sheath was exchanged for a peel-away sheath. The catheter was then placed through the peel-away sheath with tips ultimately positioned within the superior aspect of the right atrium. Final catheter positioning was confirmed and documented with a spot radiographic image. The catheter aspirates and flushes normally. The catheter was flushed with appropriate volume heparin dwells. The catheter exit site was secured with a 0-Prolene retention suture. The venotomy incision was closed Derma bond and sterile dressing. Dressings were applied at the chest wall. Patient tolerated the procedure well and remained hemodynamically stable throughout. No complications were encountered and no significant blood loss  encountered. IMPRESSION: Status post right IJ tunneled hemodialysis catheter. Signed, Dulcy Fanny. Dellia Nims, RPVI [facility] Inter[facility] Radiology Special[facility] Radiology Electronically Signed   By: Corrie Mckusick D.O.   On: 05/17/2019 16:33     Medications:     Scheduled Medications: .  sodium chloride   Intravenous Once  . amiodarone  200 mg Oral BID  . aspirin  81 mg Per Tube Daily  . atorvastatin  40 mg Per Tube q1800  . chlorhexidine gluconate (MEDLINE KIT)  15 mL Mouth Rinse BID  . Chlorhexidine Gluconate Cloth  6 each Topical Q0600  . feeding supplement (PRO-STAT SUGAR FREE 64)  60 mL Per Tube TID  . FLUoxetine  20 mg Per Tube Daily  . insulin aspart  0-20 Units Subcutaneous Q4H  . insulin aspart  5 Units Subcutaneous Q4H  . insulin detemir  25 Units Subcutaneous BID  . mouth rinse  15 mL Mouth Rinse 10 times per day  . midodrine  15 mg Per Tube TID WC  . pantoprazole sodium  40 mg Per Tube Q1200  . QUEtiapine  25 mg Per Tube QHS  . sodium chloride flush  10-40 mL Intracatheter Q12H  . sodium chloride flush  3 mL Intravenous Q12H    Infusions: . sodium chloride Stopped (05/04/19 0200)  . sodium chloride    . anticoagulant sodium citrate    . bivalirudin (ANGIOMAX) infusion 0.5 mg/mL (Non-ACS indications) 0.07 mg/kg/hr (05/18/19 1000)  . calcium gluconate infusion for CRRT Stopped (05/17/19 1400)  . feeding supplement (VITAL AF 1.2 CAL) 60 mL/hr at 05/18/19 0700  . prismasol B22GK 4/0 300 mL/hr at 05/16/19 2340  . prismasol B22GK 4/0 Stopped (05/17/19 1900)  . sodium citrate 2 %/dextrose 2.5% solution 3000 mL Stopped (05/17/19 1900)    PRN Medications: sodium chloride, sodium chloride, anticoagulant sodium citrate, bisacodyl, docusate, fentaNYL (SUBLIMAZE) injection, sodium chloride flush, sodium chloride flush   Assessment/Plan:   1. Acute on chronic systolic HF due to NICM-> cardiogenic shock - Echo EF < 20% - Off Norepi and milrinone. CO-OX stabe 65%.   - No bb with shock.  - No arb/spiro with ESRD/hypotension - Continue midodrine at 15 tid.  2. AKI -> ESRD - started on CRRT 04/23/19 -making some urine per nursing staff but but not captured due to incontinence.   - Possibe iHD tomorrow.  - if unable to get him to tolerate iHD of pressors would be a palliative situation 3. Acute CVA with severe residual deficits due to extensive R cerebellar, midbrain, R MCA and L MCA infarcts  - likely cardioembolic - s/p clot extraction - Working with PT/OT  4. Acute hypoxic respiratory failure - s/p trach  - stable on trac collar 28% 5. Wide complex tachycardia/frequent PVCs/ventricular bigeminy - continues with frequent bigeminy - had recurrent atrial tach/flutter. Ivabradine was stopped. .  -IV amio stopped earlier today. Now on amio 200 mg twice a day.  -  Keep K >4.0, Mg >2.0 - K 2.9  - Supp K.  - No beta blocker with shock 6. Leukocytosis - high risk for sepsis - remains AF - WBC 6.8  - cultures redrawn 2/25 remain NGTD 7. Apical Thrombus No heparin with + SRA. On Bival . ? Transtion to coumadin.   Length of Stay: [place]  NP-C  05/18/2019, 12:51 PM  Advanced Heart Failure Team Pager [phone] (M-F; 7a - 4p)  Please contact [facility] for night-coverage after hours (4p -7a ) and weekends on [email or web address]  Co-ox stable off inotropes. Volume status ok. Rhythm has stabilized. Now switched to po amio. Remains anuric. Now on CVVHD. Plan to trial iHD soon. Hopefully he will tolerate.   Glori Bickers, MD  5:47 PM

## 2019-05-18 NOTE — Progress Notes (Signed)
NAME:  Carlos Borawski., MRN:  [id number], DOB:  Apr 05, 1985, LOS: 26 ADMISSION DATE:  05/08/2019, CONSULTATION DATE:  04/25/2019 REFERRING MD:  Dr. Leonel Ramsay, CHIEF COMPLAINT:  Slurred speech  Brief History   34 yo male smoker found to have slurred speech and Rt sided weakness.  Admitted with left MCA CVA and multiple smaller embolic infarcts s/p tPA and thrombectomy in IR.  Additionally found to have left ventricular  thrombus.    Course complicated by septic shock r/t HCAP, AKI requiring CRRT, polymorphic VT and cardiogenic shock.  Intermittent pressor dependence, required tracheostomy  Past Medical History  Systolic CHF with non ischemic CM, Cocaine abuse, OSA, DM Hx of CHF (EF 15%)  Hx myocarditis Smoker 1/2 ppd   Significant Hospital Events   2/05 Admit, tPA, IR thrombectomy 2/6 100% on fvent at 2300  Overnight requiring increasing levophed and phenylephrine.    2/6: switched to levophed, epi, started antibiotics, started on CRRT.  2/9  developed wide-complex tachycardia and hypotension, started on amiodarone drip, increase Levophed drip 2/15 drop in platelets, heparin stopped, bival started 2/17 Hypotensive and back on Levophed 2/18 trach, lines changed 2/19 started milrinone , fever 103 2/20 Episode of wide-complex tachycardia yesterday, changed from Levophed to vasopressin 2/22 stopping vanc. Still on inotrope support. Some AF w/ RVR. Had to be placed back on pressors. ivabradine added 2/23 still on pressors/ CRRT continued 2/24 tmax 98.4, Remains on CRRT, even UF, remains anuric, Levophed at 14 mcg/min, Milrinone at 0.125 mcg/kg/min, Coox 63.4 Doing well this morning on ATC, no events overnight.  Midodrine added   2/25 more interactive, remains on ATC, tmax 99.5/ WBC 21.4, ~900 ml liquid stool/ 24 hours, neg Cdiff, Levophed up to 20 mcg/min, CVP 2, ,milrinone remains at 0.125 mcg/kg/min, coox 92.7, Off CRRT since last night ~2000 s/p clotted x 3 off citrate, restarted    3/1 Amio drip restarted for WCT/atrial flutter, milrinone turned off 3/2 CRRT stopped, permacath placed  Consults:  Neuro IR Cardiology  Nephrology Heart Failure  EP   Procedures:  2/5-2/6:  TPA given at 428 am (total of 90 Mg) 520 am went to IR  S/P Lt common carotid arteriogram followed by complete revascularization of occluded LT MCA sup division mid M2 seg with x 1 pass with 69mx 40 mm solitaire X ret river device and penumbra aspiration with TICI 3 revascularization  ETT 2/05 >> 2/18 LIJ CVL 2/5 >>2/18  RT [place] CVL 2/18 >>3/2 RIJ HD cath 2/6 >>2/18 6 shiley cuffed trach 2/18  >>  RT fem HD 2/19 >>3/2 RIJ permacath 3/2>>  Significant Diagnostic Tests:  CT angio head/neck 2/05 >> occlusion of Lt MCA bifurcation Echo 2/05 >> EF less than 20%, cannot rule out apical thrombus MRI brain 2/11 > extensive acute infarction of multiple areas without large or medium vessel occlusion Echocardiogram 2/8 Left ventricular ejection fraction, by estimation, is <20%. The left  ventricle has severely decreased function. The left ventricle demonstrates  global hypokinesis. The left ventricular internal cavity size was severely  dilated. Possible small 0.8 x  0.6 cm apical thrombus. Somewhat subtle finding, could confirm with  cardiac MRI. However, if patient has had CVA, would be reasonable to  anticoagulate given severity of LV dysfunction if no contraindications to  anticoagulation.   Micro Data:  SARS CoV2 PCR 2/05 >> negative Influenza PCR 2/05 >> negative Urine 2/6 >> ng resp 2/6 >> nml flora BC 2/6 >>ng resp 2/16 >> [facility] >> negative  BC x 2 2/25 >>ng Cdiff 2/25 >> neg  Antimicrobials:  mero 2/6 -2/12 Zosyn 2/6 , 2/19 >> 2/23 vanc 2/6 >> 2/9 , 2/19 >>2/22  Interim history/subjective:   Remains on trach collar, transient desaturations Afebrile Off CRRT Making some urine   Objective   Blood pressure (!) 92/56, pulse 66, temperature 98.6 F (37 C),  temperature source Axillary, resp. rate 15, height 6' 1"  (1.854 m), weight 110.1 kg, SpO2 95 %. CVP:  [4 mmHg-11 mmHg] 4 mmHg  FiO2 (%):  [21 %-28 %] 21 %   Intake/Output Summary (Last 24 hours) at 05/18/2019 1001 Last data filed at 05/18/2019 0700 Gross per 24 hour  Intake 1864.85 ml  Output 872 ml  Net 992.85 ml   Filed Weights   05/16/19 0600 05/17/19 0500 05/18/19 0500  Weight: 110.4 kg 109.3 kg 110.1 kg    Examination: General: Well-built, well-nourished, lying in bed in no acute distress HEENT: [place]/AT trach midline, MM pink/moist, mild pallor, no icterus Neuro: Alert and interactive, able to follow simple commands , moves all 4 extremities CV: s1s2 regular rate and rhythm, no murmur, rubs, or gallops,  PULM:  Clear to ascultation bilaterally, no accessory muscle use, mild secretions GI: soft, bowel sounds active in all 4 quadrants, non-tender, non-distended Extremities: warm/dry, no  edema  Skin: no rashes or lesions  Labs-no leukocytosis, stable anemia, hypokalemia   Resolved Hospital Problem list     HCAP 2/19- 2/23 zosyn/ vanc  Assessment & Plan:   Left MCA CVA Multiple other infarcts noted on MRI 5/00-XFGHWE embolic Acute metabolic encephalopathy -resolved P: Now that femoral line out, aggressive PT Continue nightly Seroquel  Acute systolic HF with cardiogenic shock in setting of NICM, ejection fraction is less than 20% P: Heart failure team following, appreciate assistance Off Levophed Off milrinone, coox okay   Left ventricle apical thrombus non ischemic cardiomyopathy with systolic congestive heart failure Polymorphic VT on 2/9, WCT 2/19 , 2/28 HLD P: Primary management per cardiology  Change to oral amiodarone  Daily ASA and Lipitor     Acute hypoxemic respiratory failure now tracheostomy dependent after stroke - last on MV 2/21 Plan Continue aggressive pulmonary hygiene  Trach care , tolerating ATC,Can consider cuffless trach in the near future   SLP following , and progress with PMV valve   Acute kidney injury. Still anuric  - CRRT started 2/6 Plan Nephrology following  Permacath placed, making some urine but if no renal recovery then attempt regular dialysis, no indication yet   HIT without thrombosis but needs anticoagulation for embolic CVA/ LV thrombus - platelets recovered -SRA positive at 90 Plan Avoid heparin Continue Bivalirdin -transition to coumadin eventually   DM type II poorly controlled Plan Improved control  CBG 1 40-1 80 acceptable SSI   Hx of cocaine abuse. Depression. - not active per UDS  Plan Continue Prozac   Summary-he has made some progress, liberated off the vent,, off pressors, remains to be seen whether he will tolerate intermittent dialysis   Best practice:  Diet: NPO,  TFs DVT prophylaxis: SCDs/ bivalirudin GI prophylaxis: protonix Mobility: progress when able Code Status: full code Disposition: ICU  Family -mother updated at bedside  The patient is critically ill with multiple organ systems failure and requires high complexity decision making for assessment and support, frequent evaluation and titration of therapies, application of advanced monitoring technologies and extensive interpretation of multiple databases. Critical Care Time devoted to patient care services described in this note independent of  APP/resident  time is 31 minutes.      [name] MD. Shade Flood. [facility]  If no response to pager , please call [phone]   05/18/2019

## 2019-05-18 NOTE — Progress Notes (Signed)
ANTICOAGULATION CONSULT NOTE  Pharmacy Consult:  Bivalirudin Indication: stroke 2/5, LV thrombus 2/8, HIT 2/15   Allergies  Allergen Reactions  . Heparin Other (See Comments)    Heparin induced thrombocytopenia. 2/15 HIT OD 1.692. 2/16 SRA positive-90.     Patient Measurements: Height: 6\' 1"  (185.4 cm) Weight: 240 lb 15.4 oz (109.3 kg) IBW/kg (Calculated) : 79.9    Vital Signs: Temp: 98.3 F (36.8 C) (03/03 0000) Temp Source: Axillary (03/03 0000) BP: 94/60 (03/03 0200) Pulse Rate: 99 (03/03 0200)  Labs: Recent Labs    05/15/19 0900 05/15/19 1519 05/16/19 1233 05/16/19 1237 05/17/19 0511 05/17/19 0511 05/17/19 0636 05/17/19 0639 05/17/19 0900 05/17/19 1705 05/18/19 0059  HGB 9.2*   < > 9.0*   < > 8.8*   < > 10.2* 9.5*  --   --   --   HCT 29.8*   < > 29.1*   < > 28.0*  --  30.0* 28.0*  --   --   --   PLT 354  --  294  --  252  --   --   --   --   --   --   APTT  --    < >  --   --  70*  --   --   --  68*  --  72*  LABPROT  --   --   --   --  17.6*  --   --   --   --   --   --   INR  --   --   --   --  1.5*  --   --   --   --   --   --   CREATININE  --    < >  --    < > 2.34*   < > 1.80* 2.30*  --  2.65*  --    < > = values in this interval not displayed.    Estimated Creatinine Clearance: 50.9 mL/min (A) (by C-G formula based on SCr of 2.65 mg/dL (H)).  Assessment: 34 yr old male with left MCA infarct - received TPA and revascularization with IR on 2/5. On 2/8 found to have small LV apical thrombus on ECHO. Pharmacy consulted to dose IV heparin, which was switched to bivalirudin 2/15 for HIT. 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 SRA very positive at 8. Heparin allergy has appropriately been added to chart and updated with SRA results.   APTT supratherapeutic this morning at 70 seconds on rate of 0.175 mg/kg/hr (36.5 mL/hr). Repeat level at 68 seconds, still supratherapeutic.Hemoglobin low, but stable. Platelet count WNL. No  bleeding per the RN. Patient remains on CRRT.   Patient will undergo TDF placement with bivalirudin infusion held for 2 hours prior to placement and 6 hours following placement. For now, will decrease rate to 0.15 mg/kg/hr.   3/3 AM update:  APTT remains just above low goal No issue per RN  Goal of Therapy:  APTT 50- 65 sec  Monitor platelets by anticoagulation protocol: Yes   Plan:  - Decrease bivalirudin to 0.12 mg/kg/hr - 0600 aPTT - Daily aPTT and CBC - Monitor for s/sx of bleeding  Narda Bonds, PharmD, BCPS Clinical Pharmacist Phone: [phone]

## 2019-05-18 NOTE — Progress Notes (Signed)
Rehab Admissions Coordinator Note:  Per PT and OT recommendation, this patient was screened by Raechel Ache for appropriateness for an Inpatient Acute Rehab Consult.   Noted pt may trial iHD to see if he can tolerate. Will follow along to see if pt able to tolerate the transition and will follow for greater tolerance with therapies prior to placing consult order.    Raechel Ache 05/18/2019, 6:16 PM  I can be reached at [phone].

## 2019-05-18 NOTE — Progress Notes (Signed)
Assisted tele visit to patient with family member.  Anasofia Micallef M, RN  

## 2019-05-18 NOTE — Progress Notes (Signed)
[facility] Progress Note     Assessment/ Plan:   1. AKIpresumably due to ATN with ischemic/nephrotoxic insults worsened by cardiogenic shock. Started on CRRT 04/23/19. Resumed CVVHD2/19/21 due to hyperkalemiawhich has resolved. After trial of CRRT without citrate with clotting x multiple in short time, CRRT was initially held overnight. Then resumed on 2/25-3/2  with citrate for clearance.  1. Appreciate VIR  placing RIJ TC. 2. Will plan on CRRT holiday  Wednesday if tolerated -> then challenge with iHD if there are no signs of renal recovery. 3. No indication for HD today and UOP actually 450 /24hrs. 2. Cardiogenic shock- Hx milrinone due to development of a fib with rvr and wide complex tachycardia. -Known severe NICM with EF <20% andmay not tolerate but also can't continue CRRT indefinitely. - If he doesn't tolerate iHD will need meeting with mother to define goals of care.  3. Leukocytosis - improving last check. abx per primary team. Note femoral catheter. Blood cultures sent 2/25 - listed as NGTD though low volume.CBC ordered 2/28 4. Acute stroke- presumed embolic with bilateral infarcts and LV thrombus- received tpa on admission 5. SVT/polymorphic VT- per Cardiology 6. Acute hypoxic respiratory failure- s/p trach  7. 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 HIT Ab+ on Argatroban. 8. Disposition- overall prognosis is poor with ongoing RRT (has not tolerated stopping CVVHD due to hypercatabolic state and concern for poor tolerance of intermittent HD), trach, as well as cardiogenic shock/cardiomyopathy.  Subjective:   Off milrinone and Levo  UOP 450/ 24hrs    Objective:   BP (!) 92/56 (BP Location: Left Arm)   Pulse 66   Temp 98.6 F (37 C) (Axillary)   Resp 15   Ht 6' 1"  (1.854 m)   Wt 110.1 kg   SpO2 95%   BMI 32.02 kg/m   Intake/Output Summary (Last 24 hours) at 05/18/2019 1028 Last data filed at 05/18/2019 0700 Gross per 24 hour   Intake 1864.85 ml  Output 872 ml  Net 992.85 ml   Weight change: 0.8 kg  Physical Exam: Gen: awake in bed, trach collar, nonverbal. following commands (I.e. to blink hard, squeezing hand, moving extremities) HEENT - trach in place CVS: S1S2 no rub Resp:reduced breath sounds Abd: soft/ND Ext: he has no edema Access: right IJ tunneled catheter  Imaging: IR Fluoro Guide CV Line Right  Result Date: 05/17/2019 INDICATION: 34 year old male referred for tunneled hemodialysis catheter EXAM: TUNNELED CENTRAL VENOUS HEMODIALYSIS CATHETER PLACEMENT WITH ULTRASOUND AND FLUOROSCOPIC GUIDANCE MEDICATIONS: 2 g Ancef. The antibiotic was given in an appropriate time interval prior to skin puncture. ANESTHESIA/SEDATION: Moderate (conscious) sedation was employed during this procedure. A total of Versed 1.0 mg and Fentanyl 50 mcg was administered intravenously. Moderate Sedation Time: 19 minutes. The patient's level of consciousness and vital signs were monitored continuously by radiology nursing throughout the procedure under my direct supervision. FLUOROSCOPY TIME:  Fluoroscopy Time: 1 minutes 0 seconds (45 mGy). COMPLICATIONS: None PROCEDURE: Informed written consent was obtained from the patient after a discussion of the risks, benefits, and alternatives to treatment. Questions regarding the procedure were encouraged and answered. The right neck and chest were prepped with chlorhexidine in a sterile fashion, and a sterile drape was applied covering the operative field. Maximum barrier sterile technique with sterile gowns and gloves were used for the procedure. A timeout was performed prior to the initiation of the procedure. After creating a small venotomy incision, a micropuncture kit was utilized to access the right internal jugular  vein under direct, real-time ultrasound guidance after the overlying soft tissues were anesthetized with 1% lidocaine with epinephrine. Ultrasound image documentation was  performed. The microwire was marked to measure appropriate internal catheter length. External tunneled length was estimated. A total tip to cuff length of 19 cm was selected. Skin and subcutaneous tissues of chest wall below the clavicle were generously infiltrated with 1% lidocaine for local anesthesia. A small stab incision was made with 11 blade scalpel. The selected hemodialysis catheter was tunneled in a retrograde fashion from the anterior chest wall to the venotomy incision. A guidewire was advanced to the level of the IVC and the micropuncture sheath was exchanged for a peel-away sheath. The catheter was then placed through the peel-away sheath with tips ultimately positioned within the superior aspect of the right atrium. Final catheter positioning was confirmed and documented with a spot radiographic image. The catheter aspirates and flushes normally. The catheter was flushed with appropriate volume heparin dwells. The catheter exit site was secured with a 0-Prolene retention suture. The venotomy incision was closed Derma bond and sterile dressing. Dressings were applied at the chest wall. Patient tolerated the procedure well and remained hemodynamically stable throughout. No complications were encountered and no significant blood loss encountered. IMPRESSION: Status post right IJ tunneled hemodialysis catheter. Signed, Dulcy Fanny. Dellia Nims, RPVI [facility] Electronically Signed   By: Corrie Mckusick D.O.   On: 05/17/2019 16:33   IR US Guide Vasc Access Right  Result Date: 05/17/2019 INDICATION: 34 year old male referred for tunneled hemodialysis catheter EXAM: TUNNELED CENTRAL VENOUS HEMODIALYSIS CATHETER PLACEMENT WITH ULTRASOUND AND FLUOROSCOPIC GUIDANCE MEDICATIONS: 2 g Ancef. The antibiotic was given in an appropriate time interval prior to skin puncture. ANESTHESIA/SEDATION: Moderate (conscious) sedation was employed during this procedure. A  total of Versed 1.0 mg and Fentanyl 50 mcg was administered intravenously. Moderate Sedation Time: 19 minutes. The patient's level of consciousness and vital signs were monitored continuously by radiology nursing throughout the procedure under my direct supervision. FLUOROSCOPY TIME:  Fluoroscopy Time: 1 minutes 0 seconds (45 mGy). COMPLICATIONS: None PROCEDURE: Informed written consent was obtained from the patient after a discussion of the risks, benefits, and alternatives to treatment. Questions regarding the procedure were encouraged and answered. The right neck and chest were prepped with chlorhexidine in a sterile fashion, and a sterile drape was applied covering the operative field. Maximum barrier sterile technique with sterile gowns and gloves were used for the procedure. A timeout was performed prior to the initiation of the procedure. After creating a small venotomy incision, a micropuncture kit was utilized to access the right internal jugular vein under direct, real-time ultrasound guidance after the overlying soft tissues were anesthetized with 1% lidocaine with epinephrine. Ultrasound image documentation was performed. The microwire was marked to measure appropriate internal catheter length. External tunneled length was estimated. A total tip to cuff length of 19 cm was selected. Skin and subcutaneous tissues of chest wall below the clavicle were generously infiltrated with 1% lidocaine for local anesthesia. A small stab incision was made with 11 blade scalpel. The selected hemodialysis catheter was tunneled in a retrograde fashion from the anterior chest wall to the venotomy incision. A guidewire was advanced to the level of the IVC and the micropuncture sheath was exchanged for a peel-away sheath. The catheter was then placed through the peel-away sheath with tips ultimately positioned within the superior aspect of the right atrium. Final catheter positioning was confirmed and documented with a  spot  radiographic image. The catheter aspirates and flushes normally. The catheter was flushed with appropriate volume heparin dwells. The catheter exit site was secured with a 0-Prolene retention suture. The venotomy incision was closed Derma bond and sterile dressing. Dressings were applied at the chest wall. Patient tolerated the procedure well and remained hemodynamically stable throughout. No complications were encountered and no significant blood loss encountered. IMPRESSION: Status post right IJ tunneled hemodialysis catheter. Signed, Dulcy Fanny. Dellia Nims, RPVI [facility] Electronically Signed   By: Corrie Mckusick D.O.   On: 05/17/2019 16:33    Labs: BMET Recent Labs  Lab 05/15/19 0435 05/15/19 0441 05/15/19 1650 05/15/19 1713 05/16/19 0544 05/16/19 3532 05/16/19 1633 05/16/19 1639 05/17/19 0156 05/17/19 0159 05/17/19 0511 05/17/19 0636 05/17/19 0639 05/17/19 1705 05/18/19 0304  NA 139   < > 139   < > 140  140   < > 139   < > 140 139 140 139 139 140 138  K 3.2*   < > 3.6   < > 3.2*  3.3*   < > 3.3*   < > 3.3* 3.3* 3.1* 3.4* 3.5 3.2* 2.9*  CL 96*   < > 98   < > 97*  96*   < > 96*   < > 91* 93* 93* 93* 93* 97* 94*  CO2 28  --  27  --  29  --  29  --   --   --  33*  --   --  30 30  GLUCOSE 205*   < > 195*   < > 157*  178*   < > 166*   < > 188* 144* 118* 177* 141* 106* 121*  BUN 32*   < > 32*   < > 31*  26*   < > 25*   < > 26* 29* 28* 22* 28* 25* 46*  CREATININE 2.59*   < > 3.02*   < > 2.61*  2.10*   < > 2.33*   < > 2.10* 2.50* 2.34* 1.80* 2.30* 2.65* 3.82*  CALCIUM 8.5*  --  8.5*  --  8.7*  --  8.8*  --   --   --  9.6  --   --  9.3 9.1  PHOS 3.7  --  4.0  --  3.8  --  3.6  --   --   --  4.4  --   --  4.9* 6.0*   < > = values in this interval not displayed.   CBC Recent Labs  Lab 05/15/19 0900 05/15/19 1519 05/16/19 1233 05/16/19 1237 05/17/19 0511 05/17/19 0636 05/17/19 0639 05/18/19 0304  WBC 10.2  --  10.0   --  6.8  --   --  6.2  HGB 9.2*   < > 9.0*   < > 8.8* 10.2* 9.5* 8.3*  HCT 29.8*   < > 29.1*   < > 28.0* 30.0* 28.0* 27.4*  MCV 100.3*  --  101.0*  --  100.4*  --   --  100.7*  PLT 354  --  294  --  252  --   --  223   < > = values in this interval not displayed.    Medications:    . sodium chloride   Intravenous Once  . amiodarone  200 mg Oral BID  . aspirin  81 mg Per Tube Daily  . atorvastatin  40 mg Per Tube q1800  .  chlorhexidine gluconate (MEDLINE KIT)  15 mL Mouth Rinse BID  . Chlorhexidine Gluconate Cloth  6 each Topical Q0600  . feeding supplement (PRO-STAT SUGAR FREE 64)  60 mL Per Tube TID  . FLUoxetine  20 mg Per Tube Daily  . insulin aspart  0-20 Units Subcutaneous Q4H  . insulin aspart  5 Units Subcutaneous Q4H  . insulin detemir  25 Units Subcutaneous BID  . mouth rinse  15 mL Mouth Rinse 10 times per day  . midodrine  15 mg Per Tube TID WC  . pantoprazole sodium  40 mg Per Tube Q1200  . QUEtiapine  25 mg Per Tube QHS  . sodium chloride flush  10-40 mL Intracatheter Q12H  . sodium chloride flush  3 mL Intravenous Q12H      Otelia Santee, MD 05/18/2019, 10:28 AM

## 2019-05-18 NOTE — Progress Notes (Signed)
ANTICOAGULATION CONSULT NOTE  Pharmacy Consult:  Bivalirudin Indication: stroke 2/5, LV thrombus 2/8, HIT 2/15   Allergies  Allergen Reactions  . Heparin Other (See Comments)    Heparin induced thrombocytopenia. 2/15 HIT OD 1.692. 2/16 SRA positive-90.     Patient Measurements: Height: 6\' 1"  (185.4 cm) Weight: 242 lb 11.6 oz (110.1 kg) IBW/kg (Calculated) : 79.9    Vital Signs: Temp: 98.6 F (37 C) (03/03 1200) Temp Source: Oral (03/03 1200) BP: 105/74 (03/03 1528) Pulse Rate: 88 (03/03 1528)  Labs: Recent Labs    05/16/19 1233 05/16/19 1237 05/17/19 0511 05/17/19 0511 05/17/19 0636 05/17/19 0636 05/17/19 0639 05/17/19 0900 05/17/19 1705 05/18/19 0059 05/18/19 0304 05/18/19 0600 05/18/19 1235  HGB 9.0*   < > 8.8*   < > 10.2*   < > 9.5*  --   --   --  8.3*  --   --   HCT 29.1*   < > 28.0*   < > 30.0*  --  28.0*  --   --   --  27.4*  --   --   PLT 294  --  252  --   --   --   --   --   --   --  223  --   --   APTT  --   --  70*  --   --   --   --    < >  --  72*  --  77* 59*  LABPROT  --   --  17.6*  --   --   --   --   --   --   --   --   --   --   INR  --   --  1.5*  --   --   --   --   --   --   --   --   --   --   CREATININE  --    < > 2.34*   < > 1.80*   < > 2.30*  --  2.65*  --  3.82*  --   --    < > = values in this interval not displayed.    Estimated Creatinine Clearance: 35.5 mL/min (A) (by C-G formula based on SCr of 3.82 mg/dL (H)).  Assessment: 34 yr old male with left MCA infarct - received TPA and revascularization with IR on 2/5. On 2/8 found to have small LV apical thrombus on ECHO. Pharmacy consulted to dose IV heparin, which was switched to bivalirudin 2/15 for HIT. 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 SRA very positive at 29. Heparin allergy has appropriately been added to chart and updated with SRA results.   APTT therapeutic this morning at 59 seconds on rate of 0.07 mg/kg/hr. Hemoglobin low, but stable.  Platelet count WNL. No bleeding noted.   Goal of Therapy:  APTT 50- 65 sec  Monitor platelets by anticoagulation protocol: Yes   Plan:  - Continue 0.07 mg/kg/hr - Repeat confirmatory aPTT stat - Daily aPTT and CBC - Monitor for s/sx of bleeding - F/u transition to warfarin  [name], PharmD PGY1 Pharmacy Resident

## 2019-05-18 NOTE — Progress Notes (Signed)
ANTICOAGULATION CONSULT NOTE  Pharmacy Consult:  Bivalirudin Indication: stroke 2/5, LV thrombus 2/8, HIT 2/15   Allergies  Allergen Reactions  . Heparin Other (See Comments)    Heparin induced thrombocytopenia. 2/15 HIT OD 1.692. 2/16 SRA positive-90.     Patient Measurements: Height: 6\' 1"  (185.4 cm) Weight: 242 lb 11.6 oz (110.1 kg) IBW/kg (Calculated) : 79.9    Vital Signs: Temp: 99 F (37.2 C) (03/03 0400) Temp Source: Axillary (03/03 0400) BP: 102/67 (03/03 0630) Pulse Rate: 85 (03/03 0630)  Labs: Recent Labs    05/16/19 1233 05/16/19 1237 05/17/19 0511 05/17/19 0511 05/17/19 0636 05/17/19 0636 05/17/19 0639 05/17/19 0900 05/17/19 1705 05/18/19 0059 05/18/19 0304 05/18/19 0600  HGB 9.0*   < > 8.8*   < > 10.2*   < > 9.5*  --   --   --  8.3*  --   HCT 29.1*   < > 28.0*   < > 30.0*  --  28.0*  --   --   --  27.4*  --   PLT 294  --  252  --   --   --   --   --   --   --  223  --   APTT  --   --  70*   < >  --   --   --  68*  --  72*  --  77*  LABPROT  --   --  17.6*  --   --   --   --   --   --   --   --   --   INR  --   --  1.5*  --   --   --   --   --   --   --   --   --   CREATININE  --    < > 2.34*   < > 1.80*   < > 2.30*  --  2.65*  --  3.82*  --    < > = values in this interval not displayed.    Estimated Creatinine Clearance: 35.5 mL/min (A) (by C-G formula based on SCr of 3.82 mg/dL (H)).  Assessment: 34 yr old male with left MCA infarct - received TPA and revascularization with IR on 2/5. On 2/8 found to have small LV apical thrombus on ECHO. Pharmacy consulted to dose IV heparin, which was switched to bivalirudin 2/15 for HIT. 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 SRA very positive at 25. Heparin allergy has appropriately been added to chart and updated with SRA results.   APTT supratherapeutic this morning at 70 seconds on rate of 0.175 mg/kg/hr (36.5 mL/hr). Repeat level at 68 seconds, still  supratherapeutic.Hemoglobin low, but stable. Platelet count WNL. No bleeding per the RN. Patient remains on CRRT.   Patient will undergo TDF placement with bivalirudin infusion held for 2 hours prior to placement and 6 hours following placement. For now, will decrease rate to 0.15 mg/kg/hr.   3/3 AM update:  -APTT still above goal, likely due to CRRT being stopped and Scr rising -No issue per RN  Goal of Therapy:  APTT 50- 65 sec  Monitor platelets by anticoagulation protocol: Yes   Plan:  - Decrease bivalirudin to 0.07 mg/kg/hr - 1100 aPTT - Daily aPTT and CBC - Monitor for s/sx of bleeding  Narda Bonds, PharmD, BCPS Clinical Pharmacist Phone: [phone]

## 2019-05-18 NOTE — Progress Notes (Signed)
Assisted tele visit to patient with partner.  Namrata Dangler P, RN  

## 2019-05-18 NOTE — Progress Notes (Signed)
ANTICOAGULATION CONSULT NOTE  Pharmacy Consult:  Bivalirudin Indication: stroke 2/5, LV thrombus 2/8, HIT 2/15   Allergies  Allergen Reactions  . Heparin Other (See Comments)    Heparin induced thrombocytopenia. 2/15 HIT OD 1.692. 2/16 SRA positive-90.     Patient Measurements: Height: 6\' 1"  (185.4 cm) Weight: 242 lb 11.6 oz (110.1 kg) IBW/kg (Calculated) : 79.9   Vital Signs: Temp: 98.2 F (36.8 C) (03/03 1600) Temp Source: Oral (03/03 1600) BP: 111/75 (03/03 1600) Pulse Rate: 86 (03/03 1600)  Labs: Recent Labs    05/16/19 1233 05/16/19 1237 05/17/19 0511 05/17/19 0511 05/17/19 0636 05/17/19 0636 05/17/19 0639 05/17/19 0900 05/17/19 1705 05/18/19 0059 05/18/19 0304 05/18/19 0600 05/18/19 1235 05/18/19 1700 05/18/19 1711  HGB 9.0*   < > 8.8*   < > 10.2*   < > 9.5*  --   --   --  8.3*  --   --   --   --   HCT 29.1*   < > 28.0*   < > 30.0*  --  28.0*  --   --   --  27.4*  --   --   --   --   PLT 294  --  252  --   --   --   --   --   --   --  223  --   --   --   --   APTT  --   --  70*  --   --   --   --    < >  --    < >  --  77* 59*  --  57*  LABPROT  --   --  17.6*  --   --   --   --   --   --   --   --   --   --   --   --   INR  --   --  1.5*  --   --   --   --   --   --   --   --   --   --   --   --   CREATININE  --    < > 2.34*   < > 1.80*   < > 2.30*   < > 2.65*  --  3.82*  --   --  4.92*  --    < > = values in this interval not displayed.    Estimated Creatinine Clearance: 27.5 mL/min (A) (by C-G formula based on SCr of 4.92 mg/dL (H)).  Assessment: 34 yr old male with left MCA infarct - received TPA and revascularization with IR on 2/5. On 2/8 found to have small LV apical thrombus on ECHO. Pharmacy consulted to dose IV heparin, which was switched to bivalirudin 2/15 for HIT. 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 SRA very positive at 60. Heparin allergy has appropriately been added to chart and updated with SRA results.    APTT therapeutic; no bleeding reported.  Goal of Therapy:  APTT 50-65 sec  Monitor platelets by anticoagulation protocol: Yes   Plan:  - Continue bivalirudin infusion at 0.07 mg/kg/hr - Daily aPTT and CBC - Monitor for s/sx of bleeding - F/u transition to warfarin  Pasco Marchitto D. Mina Marble, PharmD, BCPS, [place] 05/18/2019, 6:26 PM

## 2019-05-19 ENCOUNTER — Inpatient Hospital Stay (HOSPITAL_COMMUNITY): Payer: No Typology Code available for payment source

## 2019-05-19 DIAGNOSIS — N179 Acute kidney failure, unspecified: Secondary | ICD-10-CM | POA: Diagnosis not present

## 2019-05-19 DIAGNOSIS — J9500 Unspecified tracheostomy complication: Secondary | ICD-10-CM

## 2019-05-19 DIAGNOSIS — Z930 Tracheostomy status: Secondary | ICD-10-CM | POA: Diagnosis not present

## 2019-05-19 DIAGNOSIS — I6602 Occlusion and stenosis of left middle cerebral artery: Secondary | ICD-10-CM | POA: Diagnosis not present

## 2019-05-19 DIAGNOSIS — J9600 Acute respiratory failure, unspecified whether with hypoxia or hypercapnia: Secondary | ICD-10-CM | POA: Diagnosis not present

## 2019-05-19 DIAGNOSIS — Z978 Presence of other specified devices: Secondary | ICD-10-CM | POA: Diagnosis present

## 2019-05-19 LAB — RENAL FUNCTION PANEL
Albumin: 2.5 g/dL — ABNORMAL LOW (ref 3.5–5.0)
Albumin: 2.6 g/dL — ABNORMAL LOW (ref 3.5–5.0)
Anion gap: 15 (ref 5–15)
Anion gap: 19 — ABNORMAL HIGH (ref 5–15)
BUN: 86 mg/dL — ABNORMAL HIGH (ref 6–20)
BUN: 95 mg/dL — ABNORMAL HIGH (ref 6–20)
CO2: 27 mmol/L (ref 22–32)
CO2: 21 mmol/L — ABNORMAL LOW (ref 22–32)
Calcium: 9.2 mg/dL (ref 8.9–10.3)
Calcium: 9 mg/dL (ref 8.9–10.3)
Chloride: 97 mmol/L — ABNORMAL LOW (ref 98–111)
Chloride: 99 mmol/L (ref 98–111)
Creatinine, Ser: 5.63 mg/dL — ABNORMAL HIGH (ref 0.61–1.24)
Creatinine, Ser: 6.2 mg/dL — ABNORMAL HIGH (ref 0.61–1.24)
GFR calc Af Amer: 14 mL/min — ABNORMAL LOW (ref >60)
GFR calc Af Amer: 12 mL/min — ABNORMAL LOW (ref >60)
GFR calc non Af Amer: 12 mL/min — ABNORMAL LOW (ref >60)
GFR calc non Af Amer: 11 mL/min — ABNORMAL LOW (ref >60)
Glucose, Bld: 184 mg/dL — ABNORMAL HIGH (ref 70–99)
Glucose, Bld: 167 mg/dL — ABNORMAL HIGH (ref 70–99)
Phosphorus: 6.5 mg/dL — ABNORMAL HIGH (ref 2.5–4.6)
Phosphorus: 5.3 mg/dL — ABNORMAL HIGH (ref 2.5–4.6)
Potassium: 3 mmol/L — ABNORMAL LOW (ref 3.5–5.1)
Potassium: 3.6 mmol/L (ref 3.5–5.1)
Sodium: 139 mmol/L (ref 135–145)
Sodium: 139 mmol/L (ref 135–145)

## 2019-05-19 LAB — POCT I-STAT, CHEM 8
BUN: 28 mg/dL — ABNORMAL HIGH (ref 6–20)
Calcium, Ion: 0.65 mmol/L — CL (ref 1.15–1.40)
Chloride: 99 mmol/L (ref 98–111)
Creatinine, Ser: 2.6 mg/dL — ABNORMAL HIGH (ref 0.61–1.24)
Glucose, Bld: 154 mg/dL — ABNORMAL HIGH (ref 70–99)
HCT: 29 % — ABNORMAL LOW (ref 39.0–52.0)
Hemoglobin: 9.9 g/dL — ABNORMAL LOW (ref 13.0–17.0)
Potassium: 3.6 mmol/L (ref 3.5–5.1)
Sodium: 141 mmol/L (ref 135–145)
TCO2: 29 mmol/L (ref 22–32)

## 2019-05-19 LAB — GLUCOSE, CAPILLARY
Glucose-Capillary: 163 mg/dL — ABNORMAL HIGH (ref 70–99)
Glucose-Capillary: 149 mg/dL — ABNORMAL HIGH (ref 70–99)
Glucose-Capillary: 226 mg/dL — ABNORMAL HIGH (ref 70–99)
Glucose-Capillary: 148 mg/dL — ABNORMAL HIGH (ref 70–99)
Glucose-Capillary: 158 mg/dL — ABNORMAL HIGH (ref 70–99)
Glucose-Capillary: 134 mg/dL — ABNORMAL HIGH (ref 70–99)

## 2019-05-19 LAB — MAGNESIUM: Magnesium: 2 mg/dL (ref 1.7–2.4)

## 2019-05-19 LAB — CBC
HCT: 28 % — ABNORMAL LOW (ref 39.0–52.0)
Hemoglobin: 8.5 g/dL — ABNORMAL LOW (ref 13.0–17.0)
MCH: 30.5 pg (ref 26.0–34.0)
MCHC: 30.4 g/dL (ref 30.0–36.0)
MCV: 100.4 fL — ABNORMAL HIGH (ref 80.0–100.0)
Platelets: 216 10*3/uL (ref 150–400)
RBC: 2.79 MIL/uL — ABNORMAL LOW (ref 4.22–5.81)
RDW: 14.1 % (ref 11.5–15.5)
WBC: 6 10*3/uL (ref 4.0–10.5)
nRBC: 0 % (ref 0.0–0.2)

## 2019-05-19 LAB — APTT
aPTT: 49 seconds — ABNORMAL HIGH (ref 24–36)
aPTT: 55 seconds — ABNORMAL HIGH (ref 24–36)

## 2019-05-19 LAB — COOXEMETRY PANEL
Carboxyhemoglobin: 2 % — ABNORMAL HIGH (ref 0.5–1.5)
Methemoglobin: 1.1 % (ref 0.0–1.5)
O2 Saturation: 58 %
Total hemoglobin: 9 g/dL — ABNORMAL LOW (ref 12.0–16.0)

## 2019-05-19 LAB — CALCIUM, IONIZED: Calcium, Ionized, Serum: 5 mg/dL (ref 4.5–5.6)

## 2019-05-19 MED ORDER — FENTANYL CITRATE (PF) 100 MCG/2ML IJ SOLN: 200.0000 ug | Freq: Once | INTRAMUSCULAR | Status: AC

## 2019-05-19 MED ORDER — POTASSIUM CHLORIDE IN NACL 20-0.9 MEQ/L-% IV SOLN
Freq: Once | INTRAVENOUS | Status: AC
  Filled 2019-05-19: qty 1000

## 2019-05-19 MED ORDER — FENTANYL CITRATE (PF) 100 MCG/2ML IJ SOLN
INTRAMUSCULAR | Status: AC
  Administered 2019-05-19: 200 ug via INTRAVENOUS
  Filled 2019-05-19: qty 2

## 2019-05-19 MED ORDER — VITAL 1.5 CAL PO LIQD: 1000.0000 mL | ORAL | Status: DC

## 2019-05-19 MED ORDER — MIDAZOLAM HCL 2 MG/2ML IJ SOLN: 6.0000 mg | Freq: Once | INTRAMUSCULAR | Status: AC

## 2019-05-19 MED ORDER — MIDAZOLAM HCL 2 MG/2ML IJ SOLN
INTRAMUSCULAR | Status: AC
  Filled 2019-05-19: qty 2

## 2019-05-19 MED ORDER — SODIUM CHLORIDE 0.9 % IV SOLN
0.6000 mg/kg/h | INTRAVENOUS | Status: DC
  Administered 2019-05-19: 0.6 mg/kg/h via INTRAVENOUS
  Filled 2019-05-19: qty 5

## 2019-05-19 MED ORDER — MIDAZOLAM HCL 2 MG/2ML IJ SOLN
INTRAMUSCULAR | Status: AC
  Administered 2019-05-19: 6 mg via INTRAVENOUS
  Filled 2019-05-19: qty 2

## 2019-05-19 MED ORDER — ROCURONIUM BROMIDE 50 MG/5ML IV SOLN
100.0000 mg | Freq: Once | INTRAVENOUS | Status: AC
  Administered 2019-05-19: 100 mg via INTRAVENOUS
  Filled 2019-05-19: qty 10

## 2019-05-19 MED ORDER — PRO-STAT SUGAR FREE PO LIQD
60.0000 mL | Freq: Two times a day (BID) | ORAL | Status: DC
  Administered 2019-05-19 – 2019-06-29 (×76): 60 mL
  Filled 2019-05-19 (×75): qty 60

## 2019-05-19 MED ORDER — LOPERAMIDE HCL 1 MG/7.5ML PO SUSP
1.0000 mg | Freq: Two times a day (BID) | ORAL | Status: DC | PRN
  Administered 2019-05-19 – 2019-06-05 (×10): 1 mg
  Filled 2019-05-19 (×11): qty 7.5

## 2019-05-19 MED ORDER — NOREPINEPHRINE 4 MG/250ML-% IV SOLN
INTRAVENOUS | Status: AC
  Filled 2019-05-19: qty 250

## 2019-05-19 MED ORDER — VITAL 1.5 CAL PO LIQD
1000.0000 mL | ORAL | Status: DC
  Administered 2019-05-20 – 2019-05-31 (×15): 1000 mL
  Filled 2019-05-19 (×22): qty 1000

## 2019-05-19 MED ORDER — ZOLPIDEM TARTRATE 5 MG PO TABS
5.0000 mg | ORAL_TABLET | Freq: Every evening | ORAL | Status: DC | PRN
  Administered 2019-05-24 – 2019-05-26 (×3): 10 mg via ORAL
  Filled 2019-05-19 (×3): qty 2

## 2019-05-19 MED ORDER — FENTANYL CITRATE (PF) 100 MCG/2ML IJ SOLN
INTRAMUSCULAR | Status: AC
  Filled 2019-05-19: qty 2

## 2019-05-19 NOTE — Progress Notes (Signed)
ANTICOAGULATION CONSULT NOTE  Pharmacy Consult:  Bivalirudin Indication: stroke 2/5, LV thrombus 2/8, HIT 2/15   Allergies  Allergen Reactions  . Heparin Other (See Comments)    Heparin induced thrombocytopenia. 2/15 HIT OD 1.692. 2/16 SRA positive-90.     Patient Measurements: Height: 6\' 1"  (185.4 cm) Weight: 238 lb 5.1 oz (108.1 kg) IBW/kg (Calculated) : 79.9   Vital Signs: Temp: 97.9 F (36.6 C) (03/04 1600) Temp Source: Oral (03/04 1600) BP: 129/76 (03/04 1900) Pulse Rate: 93 (03/04 1900)  Labs: Recent Labs    05/17/19 0511 05/17/19 0636 05/17/19 0639 05/17/19 0900 05/18/19 0304 05/18/19 0600 05/18/19 1235 05/18/19 1700 05/18/19 1711 05/19/19 0447 05/19/19 1348 05/19/19 1627 05/19/19 1844  HGB 8.8*   < > 9.5*   < > 8.3*  --   --   --   --  8.5*  --   --   --   HCT 28.0*   < > 28.0*  --  27.4*  --   --   --   --  28.0*  --   --   --   PLT 252  --   --   --  223  --   --   --   --  216  --   --   --   APTT 70*  --   --    < >  --    < >   < >  --  57*  --  49*  --  55*  LABPROT 17.6*  --   --   --   --   --   --   --   --   --   --   --   --   INR 1.5*  --   --   --   --   --   --   --   --   --   --   --   --   CREATININE 2.34*   < > 2.30*   < > 3.82*   < >  --  4.92*  --  5.63*  --  6.20*  --    < > = values in this interval not displayed.    Estimated Creatinine Clearance: 21.7 mL/min (A) (by C-G formula based on SCr of 6.2 mg/dL (H)).  Assessment: 34 yr old male with left MCA infarct - received TPA and revascularization with IR on 2/5. On 2/8 found to have small LV apical thrombus on ECHO. Pharmacy consulted to dose IV heparin, which was switched to bivalirudin 2/15 for HIT. 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 SRA very positive at 56. Heparin allergy has appropriately been added to chart and updated with SRA results.   APTT is therapeutic with recheck; no bleeding reported.  Goal of Therapy:  APTT 50-65 sec   Monitor platelets by anticoagulation protocol: Yes   Plan:  - Continue bivalirudin infusion at 0.07 mg/kg/hr - Daily aPTT and CBC - Monitor for s/sx of bleeding - F/u transition to warfarin  Ariane Ditullio D. Mina Marble, PharmD, BCPS, [place] 05/19/2019, 7:38 PM

## 2019-05-19 NOTE — Progress Notes (Signed)
Assisted tele visit to patient with partner.  Elainah Rhyne P, RN  

## 2019-05-19 NOTE — Progress Notes (Signed)
NAME:  Carlos Brewer., MRN:  [id number], DOB:  04/09/1985, LOS: 36 ADMISSION DATE:  05/06/2019, CONSULTATION DATE:  04/24/2019 REFERRING MD:  Dr. Leonel Ramsay, CHIEF COMPLAINT:  Slurred speech  Brief History   34 yo male smoker found to have slurred speech and Rt sided weakness.  Admitted with left MCA CVA and multiple smaller embolic infarcts s/p tPA and thrombectomy in IR.  Additionally found to have left ventricular  thrombus.    Course complicated by septic shock r/t HCAP, AKI requiring CRRT, polymorphic VT and cardiogenic shock.  Intermittent pressor dependence, required tracheostomy  Past Medical History  Systolic CHF with non ischemic CM, Cocaine abuse, OSA, DM Hx of CHF (EF 15%)  Hx myocarditis Smoker 1/2 ppd   Significant Hospital Events   2/05 Admit, tPA, IR thrombectomy 2/6 100% on fvent at 2300  Overnight requiring increasing levophed and phenylephrine.    2/6: switched to levophed, epi, started antibiotics, started on CRRT.  2/9  developed wide-complex tachycardia and hypotension, started on amiodarone drip, increase Levophed drip 2/15 drop in platelets, heparin stopped, bival started 2/17 Hypotensive and back on Levophed 2/18 trach, lines changed 2/19 started milrinone , fever 103 2/20 Episode of wide-complex tachycardia yesterday, changed from Levophed to vasopressin 2/22 stopping vanc. Still on inotrope support. Some AF w/ RVR. Had to be placed back on pressors. ivabradine added 2/23 still on pressors/ CRRT continued 2/24 tmax 98.4, Remains on CRRT, even UF, remains anuric, Levophed at 14 mcg/min, Milrinone at 0.125 mcg/kg/min, Coox 63.4 Doing well this morning on ATC, no events overnight.  Midodrine added   2/25 more interactive, remains on ATC, tmax 99.5/ WBC 21.4, ~900 ml liquid stool/ 24 hours, neg Cdiff, Levophed up to 20 mcg/min, CVP 2, ,milrinone remains at 0.125 mcg/kg/min, coox 92.7, Off CRRT since last night ~2000 s/p clotted x 3 off citrate, restarted    3/1 Amio drip restarted for WCT/atrial flutter, milrinone turned off 3/2 CRRT stopped, permacath placed  Consults:  Neuro IR Cardiology  Nephrology Heart Failure  EP   Procedures:  2/5-2/6:  TPA given at 428 am (total of 90 Mg) 520 am went to IR  S/P Lt common carotid arteriogram followed by complete revascularization of occluded LT MCA sup division mid M2 seg with x 1 pass with 54mx 40 mm solitaire X ret river device and penumbra aspiration with TICI 3 revascularization  ETT 2/05 >> 2/18 [facility] CVL 2/5 >>2/18  RT [place] CVL 2/18 >>3/2 RIJ HD cath 2/6 >>2/18 6 shiley cuffed trach 2/18  >>  RT fem HD 2/19 >>3/2 RIJ permacath 3/2>>  Significant Diagnostic Tests:  CT angio head/neck 2/05 >> occlusion of Lt MCA bifurcation Echo 2/05 >> EF less than 20%, cannot rule out apical thrombus MRI brain 2/11 > extensive acute infarction of multiple areas without large or medium vessel occlusion Echocardiogram 2/8 Left ventricular ejection fraction, by estimation, is <20%. The left  ventricle has severely decreased function. The left ventricle demonstrates  global hypokinesis. The left ventricular internal cavity size was severely  dilated. Possible small 0.8 x  0.6 cm apical thrombus. Somewhat subtle finding, could confirm with  cardiac MRI. However, if patient has had CVA, would be reasonable to  anticoagulate given severity of LV dysfunction if no contraindications to  anticoagulation.   Micro Data:  SARS CoV2 PCR 2/05 >> negative Influenza PCR 2/05 >> negative Urine 2/6 >> ng resp 2/6 >> nml flora BC 2/6 >>ng resp 2/16 [facility] negative  BC x 2 2/25 >>ng Cdiff 2/25 >> neg  Antimicrobials:  mero 2/6 -2/12 Zosyn 2/6 , 2/19 >> 2/23 vanc 2/6 >> 2/9 , 2/19 >>2/22  Interim history/subjective:   Continues on trach collar Afebrile Remains off CRRT Loose stools, with Flexi-Seal   Objective   Blood pressure 121/73, pulse 96, temperature 97.7 F (36.5 C), temperature  source Oral, resp. rate 20, height _0  (1.854 m), weight 108.1 kg, SpO2 100 %. CVP:  [6 mmHg-11 mmHg] 6 mmHg  FiO2 (%):  [21 %] 21 %   Intake/Output Summary (Last 24 hours) at 05/19/2019 1008 Last data filed at 05/19/2019 1000 Gross per 24 hour  Intake 1684.6 ml  Output 850 ml  Net 834.6 ml   Filed Weights   05/17/19 0500 05/18/19 0500 05/19/19 0500  Weight: 109.3 kg 110.1 kg 108.1 kg    Examination: Unchanged General: Well-built, well-nourished, lying in bed in no acute distress HEENT: [place]/AT trach midline, MM pink/moist, mild pallor, no icterus Neuro: Alert and interactive, able to follow simple commands , moves all 4 extremities CV: s1s2 regular rate and rhythm, no murmur, rubs, or gallops,  PULM:  Clear to ascultation bilaterally, no accessory muscle use, mild secretions GI: soft, bowel sounds active in all 4 quadrants, non-tender, non-distended Extremities: warm/dry, no  edema  Skin: no rashes or lesions  Labs-hypokalemia , stable anemia   Resolved Hospital Problem list     HCAP 2/19- 2/23 zosyn/ vanc  Assessment & Plan:   Left MCA CVA Multiple other infarcts noted on MRI 1/74-BSWHQP embolic Acute metabolic encephalopathy -resolved P:  aggressive PT Dc  Seroquel , use Ambien as needed for insomnia  Acute systolic HF with cardiogenic shock in setting of NICM, ejection fraction is less than 20% P: Heart failure team following, appreciate assistance Off Levophed Off milrinone On midodrine   Left ventricle apical thrombus non ischemic cardiomyopathy with systolic congestive heart failure Polymorphic VT on 2/9, WCT 2/19 , 2/28 HLD P: Primary management per cardiology  Change to oral amiodarone  Daily ASA and Lipitor     Acute hypoxemic respiratory failure now tracheostomy dependent after stroke - last on MV 2/21 Plan Continue aggressive pulmonary hygiene  Trach care , tolerating ATC, changed to cuffless tracheostomy #4 SLP following , and progress with  PMV valve Advance swallow   Acute kidney injury -starting to make urine - CRRT started 2/6 Plan Nephrology following  Permacath placed, making for 50 cc urine  No indication for dialysis currently Hypokalemia will be repleted, nephrology planning to give 1 L of fluids   HIT without thrombosis but needs anticoagulation for embolic CVA/ LV thrombus - platelets recovered -SRA positive at 90 Plan Avoid heparin Continue Bivalirdin -transition to coumadin eventually   DM type II poorly controlled Plan Improved control  CBG 1 40-1 80 acceptable SSI   Hx of cocaine abuse. Depression. - not active per UDS  Plan Continue Prozac   Summary-he has made some progress, liberated off the vent,, off pressors, hopefully he is having some renal recovery Plan to downsize trach today and advance swallow If it is clear that he does not need PEG, then will transition to oral anticoagulation   Best practice:  Diet: NPO,  TFs DVT prophylaxis: SCDs/ bivalirudin GI prophylaxis: protonix Mobility: progress when able Code Status: full code Disposition: ICU  Family -mother updated at bedside  The patient is critically ill with multiple organ systems failure and requires high complexity decision making for assessment and support, frequent  evaluation and titration of therapies, application of advanced monitoring technologies and extensive interpretation of multiple databases. Critical Care Time devoted to patient care services described in this note independent of APP/resident  time is 31 minutes.     [name] MD. Shade Flood. [facility]  If no response to pager , please call [phone]   05/19/2019

## 2019-05-19 NOTE — Progress Notes (Signed)
Physical Therapy Treatment Patient Details Name: Carlos Brewer. MRN: [id number] DOB: April 15, 1985 Today's Date: 05/19/2019    History of Present Illness Pt is a 34 y/o male smoker who initially presented on 2/5 with slurred speech and Rt sided weakness. Admitted with left MCA CVA and multiple smaller embolic infarcts s/p tPA and thrombectomy in IR.  Additionally found to have left ventricular  thrombus. Hospital course complicated by septic shock r/t HCAP, AKI requiring CRRT, polymorphic VT and cardiogenic shock. Echo EF < 20%. Pt with Intermittent pressor dependence, required tracheostomy.     PT Comments    Patient progressing this session to OOB and tolerating sit to stand with use of lift equipment.  Needed mod A for lifting initially, but higher Stedy seat min a for sit to stand, but did not tolerate standing long.  Progressing with extremity strengthening activities.  Feel patient remains excellent candidate for CIR level rehab upon d/c.  PT to follow acutely.    Follow Up Recommendations  CIR;Supervision/Assistance - 24 hour     Equipment Recommendations  Other (comment)(TBA)    Recommendations for Other Services       Precautions / Restrictions Precautions Precautions: Fall Precaution Comments: flexiseal, NG    Mobility  Bed Mobility Overal bed mobility: Needs Assistance Bed Mobility: Rolling;Sidelying to Sit Rolling: Mod assist Sidelying to sit: Mod assist;+2 for safety/equipment       General bed mobility comments: assist for LEs and trunk, cues for pt to self assist with transitions  Transfers Overall transfer level: Needs assistance   Transfers: Sit to/from Stand;Stand Pivot Transfers Sit to Stand: Mod assist;+2 safety/equipment;From elevated surface Stand pivot transfers: Total assist       General transfer comment: up from bed at elevated height to [facility] [address] with mod A for safety and initially with lifting help. up from Parks seat to sit on recliner  with min A; total A for pivoting with Stedy  Ambulation/Gait                 Stairs             Wheelchair Mobility    Modified Rankin (Stroke Patients Only)       Balance                                            Cognition Arousal/Alertness: Awake/alert Behavior During Therapy: Flat affect;WFL for tasks assessed/performed Overall Cognitive Status: Difficult to assess                                 General Comments: wearinv PMSV throughout session, but no attempts to verbalize, communicates with gestures and head nods, followed commands 100% today with increased time      Exercises Other Exercises Other Exercises: seated in recliner performed core strengthening leaning forward and back without UE support x 5; seated scap squeezes with towel roll at spine x 5 w/ 5 sec hold, reaching forward with UE's AAROM x 5 Other Exercises: Supine therex ankle pumps x 10, heel slides AAROM x 10    General Comments        Pertinent Vitals/Pain Faces Pain Scale: No hurt    Home Living  Prior Function            PT Goals (current goals can now be found in the care plan section) Progress towards PT goals: Progressing toward goals    Frequency    Min 4X/week      PT Plan Current plan remains appropriate    Co-evaluation              AM-PAC PT "6 Clicks" Mobility   Outcome Measure  Help needed turning from your back to your side while in a flat bed without using bedrails?: A Little Help needed moving from lying on your back to sitting on the side of a flat bed without using bedrails?: A Lot Help needed moving to and from a bed to a chair (including a wheelchair)?: A Lot Help needed standing up from a chair using your arms (e.g., wheelchair or bedside chair)?: A Lot Help needed to walk in hospital room?: Total Help needed climbing 3-5 steps with a railing? : Total 6 Click Score: 11     End of Session Equipment Utilized During Treatment: Oxygen(trach collar) Activity Tolerance: Patient tolerated treatment well Patient left: in chair;with call bell/phone within reach;with family/visitor present;with chair alarm set   PT Visit Diagnosis: Other abnormalities of gait and mobility (R26.89);Muscle weakness (generalized) (M62.81);Other symptoms and signs involving the nervous system (R29.898)     Time: [id number] PT Time Calculation (min) (ACUTE ONLY): 31 min  Charges:  $Therapeutic Exercise: 8-22 mins $Therapeutic Activity: 8-22 mins                     Magda Kiel, [place] Morrill [phone] 05/19/2019    Reginia Naas 05/19/2019, 5:15 PM

## 2019-05-19 NOTE — Progress Notes (Signed)
ANTICOAGULATION CONSULT NOTE  Pharmacy Consult:  Bivalirudin Indication: stroke 2/5, LV thrombus 2/8, HIT 2/15   Allergies  Allergen Reactions  . Heparin Other (See Comments)    Heparin induced thrombocytopenia. 2/15 HIT OD 1.692. 2/16 SRA positive-90.     Patient Measurements: Height: 6\' 1"  (185.4 cm) Weight: 238 lb 5.1 oz (108.1 kg) IBW/kg (Calculated) : 79.9   Vital Signs: Temp: 98 F (36.7 C) (03/04 1200) Temp Source: Oral (03/04 1200) BP: 111/70 (03/04 1529) Pulse Rate: 95 (03/04 1529)  Labs: Recent Labs    05/17/19 0511 05/17/19 0636 05/17/19 0639 05/17/19 0900 05/18/19 0304 05/18/19 0600 05/18/19 1235 05/18/19 1700 05/18/19 1711 05/19/19 0447 05/19/19 1348  HGB 8.8*   < > 9.5*   < > 8.3*  --   --   --   --  8.5*  --   HCT 28.0*   < > 28.0*  --  27.4*  --   --   --   --  28.0*  --   PLT 252  --   --   --  223  --   --   --   --  216  --   APTT 70*  --   --    < >  --    < > 59*  --  57*  --  49*  LABPROT 17.6*  --   --   --   --   --   --   --   --   --   --   INR 1.5*  --   --   --   --   --   --   --   --   --   --   CREATININE 2.34*   < > 2.30*   < > 3.82*  --   --  4.92*  --  5.63*  --    < > = values in this interval not displayed.    Estimated Creatinine Clearance: 23.8 mL/min (A) (by C-G formula based on SCr of 5.63 mg/dL (H)).  Assessment: 34 yr old male with left MCA infarct - received TPA and revascularization with IR on 2/5. On 2/8 found to have small LV apical thrombus on ECHO. Pharmacy consulted to dose IV heparin, which was switched to bivalirudin 2/15 for HIT. 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 SRA very positive at 27. Heparin allergy has appropriately been added to chart and updated with SRA results.   APTT slightly subtherapeutic; no bleeding reported. Hgb low but stable. Platelet count WNL.   Goal of Therapy:  APTT 50-65 sec  Monitor platelets by anticoagulation protocol: Yes   Plan:  - Continue  bivalirudin infusion at 0.07 mg/kg/hr - Will recheck aPTT in 2 hours - Daily aPTT and CBC - Monitor for s/sx of bleeding - F/u transition to warfarin  [name], PharmD PGY1 Pharmacy Resident

## 2019-05-19 NOTE — Procedures (Signed)
Percutaneous Tracheostomy Procedure Note Patient Details:   Name: Carlos Brewer. DOB: 1985-07-17 MRN: [id number]  Consent: spoke to patient's girlfriend prior to this event.  Procedure was done under emergent consent.   Indications: Pt evaluated and found to have inadvertent decannulation Noted to have episodes of desaturation and increased work of breathing Requiring securing airway with endotracheal intubation followed by bronchoscopic evaluation and re-cannulation for placement of a stable tracheostomy  Personnel:  2 RTs at bedside NP operating Bronchoscope All personnel in PPE   Patient Preparation:  Patient in supine position with shoulder roll beneath scapulae for extension of neck exposure of anterior neck. This was done prior to intubation.  Pt was endotracheally intubated with glideoscope with a size 8 ETT prior to procedure.  He was adequately sedated with fentanyl 100 mg Versed 6mg   and received NMB Rocuronium 100 mg. Subsequently started on  ketamine IV.  Once intubated end tidal and b/l breath sounds confirmed position and pt was placed on PRVC FiO2 100% PEEP 5 with TV 8 cc/kg.  O2 saturations >98%   Procedure: Swivel adaptor was connected to the ETT by RT, and was then re-connected to circuit and ventilation resumed.  Bronchoscope inserted into adapter connected to size 8 ETT and confirmed in airway and at 2 cm above carina.  Existing size 4 trach removed at that time  Bronchoscope inserted into the previous dilated incision for examination, tract not aligned. Subcutaneous tissue abutting. Not able to push bronchoscope safely in.  Bronchoscope removed and re-inserted into the oral endotracheal tube for the remainder of the procedure to ensure control of the airway.   Skin of anterior neck was cleaned thoroughly with Chlorhexidine small amount of granulation tissue noted around previous incision.  Maximal barrier precautions used for sterile technique including:  Surgical gown, mask, cap, gloves, handwashing, eye protection. Sterile Drape placed around neck, Skin of the anterior neck was cleaned again with clorrhexidine Bronchoscope inserted into size 8 ETT Finder needle was not needed due to preexisting dilated skin incision being patent Used left hand to hold trachea in position and realign skin opening with previous tracheal ostomy then introduced small tracheal dilator through preexisting skin incision using bronchoscopic  visualization. Simultaneously RT deflated ETT and pulled it back slowly under bronchoscopic guidance.  Small tracheal dilator was visualized. Once visualized the balloon of ETT was re-inflated. Guidewire introduced through small tracheal dilator and advanced under bronchoscopic visualization distally toward the carina. Small tracheal dilator removed while keeping the guidewire in place.  Single stage progressive dilator with protective sheath loaded was introduced over guidewire. Protective sheath and wire kept in place during dilation.  Dilator removed. Introduced cuffed non fenestrated size 6 trach with curve directed toward patient's head over protective sheath and guide wire. Once tracheostomy tube was visualized under bronchoscopy it was rotated 180 degrees to normal orientation to prevent creation of a pretracheal plane. Protective sheath and guidewire were removed. Secondary confirmation of size 6 trach in airway with bronchoscopic evaluation through trach cannula. Inflated balloon on tracheostomy cuff and inserted inner cannula. ETCO2 confirmed color change and pt was connected to mechanical ventilation with matching Vti and VTe and O2 sat 100% Secured Tracheostomy tube with a Tracheostomy collar and inserted a slpit guaze between trach and skin. No sutures were performed.    Evaluation post procedure:  O2 sats: stable throughout Complications: No apparent complications Patient did tolerate procedure well. Bilateral breath dounds  equal VTi and VTe  CXR confirms tracheostomy in position.  Rise Paganini Avenell Sellers 05/19/2019, 10:51 PM

## 2019-05-19 NOTE — Significant Event (Signed)
PCCM OVERNIGHT  Called to bedside by E link NP Simpson at bedside Inserted bronchoscope into existing Trach size 4 and it is not in the airway. It is abutting subcutaneous tissue, does not advance easily. ETCO2 was connected to trach with no color change   No CXR post exchange to size 4 on chart review Pt placed on nasal cannula and had improvement in O2 sats Remains awake and alert with non verbal response HR 102-108 with stable BP Without passive oxygenation pt has audible grunting desaturates to 88% and uses accessory muscles.  Discussed with RN, NP and RT Will intubated pt from above so we have a stable airway  Then evaluate and remove malpositioned size 4 and properly position size 6 cuffed trach with guidewire using Seldinger technique to ensure that trach is in airway. Once positioned will wean pt back to trach collar.   Marland Kitchen.Signed Dr Seward [facility] Locums

## 2019-05-19 NOTE — Progress Notes (Signed)
Assisted tele visit to patient with family member.  Cece Milhouse R, RN  

## 2019-05-19 NOTE — Procedures (Signed)
Objective Swallowing Evaluation: Type of Study: FEES-Fiberoptic Endoscopic Evaluation of Swallow   Patient Details  Name: Carlos Brewer. MRN: [id number] Date of Birth: 1985-04-25  Today's Date: 05/19/2019 Time: SLP Start Time (ACUTE ONLY): 1340 -SLP Stop Time (ACUTE ONLY): 1402  SLP Time Calculation (min) (ACUTE ONLY): 22 min   Past Medical History:  Past Medical History:  Diagnosis Date  . CHF (congestive heart failure) ([place])   . Cocaine abuse ([place])   . HFrEF (heart failure with reduced ejection fraction) ([place])    a. 10/2016 Echo: EF 15-20%, Gr2 DD, mildly dil LA/RA.  Marland Kitchen History of cardiac cath    a. 10/2016 Cath: LM nl, LAD min irregs, LCX nl, RCA nl, EF 15%.  . Morbid obesity ([place])   . Myocarditis ([place])    a. 10/2016 Admit w/ CHF and trop elevation; b. 10/2016 Echo: EF 15-20%; c. 10/2016 Cath: Min irregs in LAD otw nl cors, EF 15%, glob HK.  Marland Kitchen NICM (nonischemic cardiomyopathy) ([place])    a. 10/2016 Echo: EF 15-20%, Gr2 DD; b. 10/2016 Cath: Nl cors.  . Palpitations   . Sleep apnea    USES CPAP  . Tobacco abuse    Past Surgical History:  Past Surgical History:  Procedure Laterality Date  . CORONARY ANGIOPLASTY    . IR CT HEAD LTD  04/29/2019  . IR FLUORO GUIDE CV LINE RIGHT  05/17/2019  . IR PATIENT EVAL TECH 0-60 MINS  04/23/2019  . IR PERCUTANEOUS ART THROMBECTOMY/INFUSION INTRACRANIAL INC DIAG ANGIO  05/12/2019  . IR US GUIDE VASC ACCESS RIGHT  05/17/2019  . NO PAST SURGERIES    . RADIOLOGY WITH ANESTHESIA N/A 04/27/2019   Procedure: RADIOLOGY WITH ANESTHESIA;  Surgeon: Luanne Bras, MD;  Location: [facility];  Service: Radiology;  Laterality: N/A;  . RIGHT/LEFT HEART CATH AND CORONARY ANGIOGRAPHY N/A 10/27/2016   Procedure: RIGHT/LEFT HEART CATH AND CORONARY ANGIOGRAPHY;  Surgeon: Wellington Hampshire, MD;  Location: [facility];  Service: Cardiovascular;  Laterality: N/A;  . WISDOM TOOTH EXTRACTION  2008   HPI: 34 yo male smoker found to have slurred speech and Rt sided  weakness.  CT head showed M2 occlusion.  Ultimately treated with thrombolytic and thrombectomy by IR.  Required intubation for airway protection. Course complicated by septic shock, AKI requiring CRRT and polymorphic VT. Intubated 2/5-2/18.   Subjective: alert, cooperative    Assessment / Plan / Recommendation  CHL IP CLINICAL IMPRESSIONS 05/19/2019  Clinical Impression Pt seen for FEES just after trach change to 4 cuffless, PMSV in place with improved breath support for voice with single words. Pt demonstrates mild oral dysphagia with slow oral movement and manipulation, but adequate and improving throughout session. Mastication slightly efforful. Otherwise, pharyngeal function WNL, pt able to tolerate consecutive straw sips without aspiration. Recommend pt iniiate a dys 2/thin liquid diet with full supervision for self feeding. SLP will follow for tolerance.   SLP Visit Diagnosis Dysphagia, oropharyngeal phase (R13.12)  Attention and concentration deficit following --  Frontal lobe and executive function deficit following --  Impact on safety and function Mild aspiration risk      CHL IP TREATMENT RECOMMENDATION 05/19/2019  Treatment Recommendations Therapy as outlined in treatment plan below     Prognosis 05/19/2019  Prognosis for Safe Diet Advancement Good  Barriers to Reach Goals --  Barriers/Prognosis Comment --    CHL IP DIET RECOMMENDATION 05/19/2019  SLP Diet Recommendations Dysphagia 2 (Fine chop) solids;Thin liquid  Liquid Administration via Straw  Medication Administration Whole meds with puree  Compensations Slow rate;Small sips/bites  Postural Changes Seated upright at 90 degrees;Remain semi-upright after after feeds/meals (Comment)      CHL IP OTHER RECOMMENDATIONS 05/19/2019  Recommended Consults --  Oral Care Recommendations Oral care BID  Other Recommendations --      CHL IP FOLLOW UP RECOMMENDATIONS 05/19/2019  Follow up Recommendations Inpatient Rehab      CHL IP  FREQUENCY AND DURATION 05/19/2019  Speech Therapy Frequency (ACUTE ONLY) min 2x/week  Treatment Duration 2 weeks           CHL IP ORAL PHASE 05/19/2019  Oral Phase Impaired  Oral - Pudding Teaspoon --  Oral - Pudding Cup --  Oral - Honey Teaspoon --  Oral - Honey Cup --  Oral - Nectar Teaspoon (No Data)  Oral - Nectar Cup --  Oral - Nectar Straw Other (Comment)  Oral - Thin Teaspoon Other (Comment)  Oral - Thin Cup --  Oral - Thin Straw Other (Comment)  Oral - Puree Other (Comment)  Oral - Mech Soft (No Data)  Oral - Regular --  Oral - Multi-Consistency --  Oral - Pill --  Oral Phase - Comment --    CHL IP PHARYNGEAL PHASE 05/19/2019  Pharyngeal Phase WFL  Pharyngeal- Pudding Teaspoon --  Pharyngeal --  Pharyngeal- Pudding Cup --  Pharyngeal --  Pharyngeal- Honey Teaspoon --  Pharyngeal --  Pharyngeal- Honey Cup --  Pharyngeal --  Pharyngeal- Nectar Teaspoon --  Pharyngeal --  Pharyngeal- Nectar Cup --  Pharyngeal --  Pharyngeal- Nectar Straw --  Pharyngeal --  Pharyngeal- Thin Teaspoon --  Pharyngeal --  Pharyngeal- Thin Cup --  Pharyngeal --  Pharyngeal- Thin Straw --  Pharyngeal --  Pharyngeal- Puree --  Pharyngeal --  Pharyngeal- Mechanical Soft --  Pharyngeal --  Pharyngeal- Regular --  Pharyngeal --  Pharyngeal- Multi-consistency --  Pharyngeal --  Pharyngeal- Pill --  Pharyngeal --  Pharyngeal Comment --     No flowsheet data found.  Herbie Baltimore, MA [place]  Acute Rehabilitation Services Pager [phone] Office [phone]  Lynann Beaver 05/19/2019, 3:25 PM

## 2019-05-19 NOTE — Progress Notes (Signed)
[facility] Progress Note     Assessment/ Plan:   1. AKIpresumably due to ATN with ischemic/nephrotoxic insults worsened by cardiogenic shock. Started on CRRT 04/23/19. Resumed CVVHD2/19/21 due to hyperkalemiawhich has resolved. After trial of CRRT without citrate with clotting x multiple in short time, CRRT was initially held overnight. Then resumed on 2/25-3/2  with citrate for clearance.  1. AppreciateVIR placing RIJ TC. 2. He's actually making ~450 ml /24 hrs for 2 days in a row now. Even though numbers are worse would like to challenge him w/ fluids before  challenging with iHD. 3. Plan on challenging with iHD Friday if fluid challenge fails. 4. No absolute indication for HD today. 2. Cardiogenic shock- Hx milrinone due to development of a fib with rvr and wide complex tachycardia. -Known severe NICM with EF <20% andmay not tolerate but also can't continue CRRT indefinitely. - If he doesn't tolerate iHD will need meeting with mother to define goals of care.  3. Leukocytosis - improving last check. abx per primary team. Note femoral catheter. Blood cultures sent 2/25 - listed as NGTD though low volume. 4. Acute stroke- presumed embolic with bilateral infarcts and LV thrombus- received tpa on admission 5. SVT/polymorphic VT- per Cardiology 6. Acute hypoxic respiratory failure- s/p trach  7. 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 HIT Ab+on Argatroban. 8. Disposition- overall prognosis is poor with ongoing RRT (has not tolerated stopping CVVHD due to hypercatabolic state and concern for poor tolerance of intermittent HD), trach, as well as cardiogenic shock/cardiomyopathy.  Subjective:   Offmilrinone and Levo  UOP 450/450 48hrs   Objective:   BP 106/77   Pulse 91   Temp 97.8 F (36.6 C) (Axillary)   Resp 17   Ht 6' 1"  (1.854 m)   Wt 108.1 kg   SpO2 100%   BMI 31.44 kg/m   Intake/Output Summary (Last 24 hours) at 05/19/2019 0802 Last data  filed at 05/19/2019 0700 Gross per 24 hour  Intake 1798.66 ml  Output 850 ml  Net 948.66 ml   Weight change: -2 kg  Physical Exam: Gen: awake in bed, trach collar, nonverbal. following commands (I.e. to blink hard, squeezing hand, moving extremities) HEENT - trach in place CVS: S1S2 no rub Resp:reduced breath sounds Abd: soft/ND Ext: he has no edema Access: right IJ tunneled catheter  Imaging: IR Fluoro Guide CV Line Right  Result Date: 05/17/2019 INDICATION: 34 year old male referred for tunneled hemodialysis catheter EXAM: TUNNELED CENTRAL VENOUS HEMODIALYSIS CATHETER PLACEMENT WITH ULTRASOUND AND FLUOROSCOPIC GUIDANCE MEDICATIONS: 2 g Ancef. The antibiotic was given in an appropriate time interval prior to skin puncture. ANESTHESIA/SEDATION: Moderate (conscious) sedation was employed during this procedure. A total of Versed 1.0 mg and Fentanyl 50 mcg was administered intravenously. Moderate Sedation Time: 19 minutes. The patient's level of consciousness and vital signs were monitored continuously by radiology nursing throughout the procedure under my direct supervision. FLUOROSCOPY TIME:  Fluoroscopy Time: 1 minutes 0 seconds (45 mGy). COMPLICATIONS: None PROCEDURE: Informed written consent was obtained from the patient after a discussion of the risks, benefits, and alternatives to treatment. Questions regarding the procedure were encouraged and answered. The right neck and chest were prepped with chlorhexidine in a sterile fashion, and a sterile drape was applied covering the operative field. Maximum barrier sterile technique with sterile gowns and gloves were used for the procedure. A timeout was performed prior to the initiation of the procedure. After creating a small venotomy incision, a micropuncture kit was utilized to access  the right internal jugular vein under direct, real-time ultrasound guidance after the overlying soft tissues were anesthetized with 1% lidocaine with epinephrine.  Ultrasound image documentation was performed. The microwire was marked to measure appropriate internal catheter length. External tunneled length was estimated. A total tip to cuff length of 19 cm was selected. Skin and subcutaneous tissues of chest wall below the clavicle were generously infiltrated with 1% lidocaine for local anesthesia. A small stab incision was made with 11 blade scalpel. The selected hemodialysis catheter was tunneled in a retrograde fashion from the anterior chest wall to the venotomy incision. A guidewire was advanced to the level of the IVC and the micropuncture sheath was exchanged for a peel-away sheath. The catheter was then placed through the peel-away sheath with tips ultimately positioned within the superior aspect of the right atrium. Final catheter positioning was confirmed and documented with a spot radiographic image. The catheter aspirates and flushes normally. The catheter was flushed with appropriate volume heparin dwells. The catheter exit site was secured with a 0-Prolene retention suture. The venotomy incision was closed Derma bond and sterile dressing. Dressings were applied at the chest wall. Patient tolerated the procedure well and remained hemodynamically stable throughout. No complications were encountered and no significant blood loss encountered. IMPRESSION: Status post right IJ tunneled hemodialysis catheter. Signed, Dulcy Fanny. Dellia Nims, RPVI [facility] Radiology Electronically Signed   By: Corrie Mckusick D.O.   On: 05/17/2019 16:33   IR US Guide Vasc Access Right  Result Date: 05/17/2019 INDICATION: 34 year old male referred for tunneled hemodialysis catheter EXAM: TUNNELED CENTRAL VENOUS HEMODIALYSIS CATHETER PLACEMENT WITH ULTRASOUND AND FLUOROSCOPIC GUIDANCE MEDICATIONS: 2 g Ancef. The antibiotic was given in an appropriate time interval prior to skin puncture. ANESTHESIA/SEDATION: Moderate (conscious) sedation  was employed during this procedure. A total of Versed 1.0 mg and Fentanyl 50 mcg was administered intravenously. Moderate Sedation Time: 19 minutes. The patient's level of consciousness and vital signs were monitored continuously by radiology nursing throughout the procedure under my direct supervision. FLUOROSCOPY TIME:  Fluoroscopy Time: 1 minutes 0 seconds (45 mGy). COMPLICATIONS: None PROCEDURE: Informed written consent was obtained from the patient after a discussion of the risks, benefits, and alternatives to treatment. Questions regarding the procedure were encouraged and answered. The right neck and chest were prepped with chlorhexidine in a sterile fashion, and a sterile drape was applied covering the operative field. Maximum barrier sterile technique with sterile gowns and gloves were used for the procedure. A timeout was performed prior to the initiation of the procedure. After creating a small venotomy incision, a micropuncture kit was utilized to access the right internal jugular vein under direct, real-time ultrasound guidance after the overlying soft tissues were anesthetized with 1% lidocaine with epinephrine. Ultrasound image documentation was performed. The microwire was marked to measure appropriate internal catheter length. External tunneled length was estimated. A total tip to cuff length of 19 cm was selected. Skin and subcutaneous tissues of chest wall below the clavicle were generously infiltrated with 1% lidocaine for local anesthesia. A small stab incision was made with 11 blade scalpel. The selected hemodialysis catheter was tunneled in a retrograde fashion from the anterior chest wall to the venotomy incision. A guidewire was advanced to the level of the IVC and the micropuncture sheath was exchanged for a peel-away sheath. The catheter was then placed through the peel-away sheath with tips ultimately positioned within the superior aspect of the right atrium. Final catheter positioning  was  confirmed and documented with a spot radiographic image. The catheter aspirates and flushes normally. The catheter was flushed with appropriate volume heparin dwells. The catheter exit site was secured with a 0-Prolene retention suture. The venotomy incision was closed Derma bond and sterile dressing. Dressings were applied at the chest wall. Patient tolerated the procedure well and remained hemodynamically stable throughout. No complications were encountered and no significant blood loss encountered. IMPRESSION: Status post right IJ tunneled hemodialysis catheter. Signed, Dulcy Fanny. Dellia Nims, RPVI [facility] Radiology Electronically Signed   By: Corrie Mckusick D.O.   On: 05/17/2019 16:33    Labs: BMET Recent Labs  Lab 05/16/19 0544 05/16/19 0552 05/16/19 1633 05/16/19 1639 05/17/19 0511 05/17/19 0636 05/17/19 0639 05/17/19 1705 05/18/19 0304 05/18/19 1700 05/19/19 0447  NA 140  140   < > 139   < > 140 139 139 140 138 139 139  K 3.2*  3.3*   < > 3.3*   < > 3.1* 3.4* 3.5 3.2* 2.9* 3.1* 3.0*  CL 97*  96*   < > 96*   < > 93* 93* 93* 97* 94* 97* 97*  CO2 29  --  29  --  33*  --   --  30 30 27 27   GLUCOSE 157*  178*   < > 166*   < > 118* 177* 141* 106* 121* 156* 184*  BUN 31*  26*   < > 25*   < > 28* 22* 28* 25* 46* 64* 86*  CREATININE 2.61*  2.10*   < > 2.33*   < > 2.34* 1.80* 2.30* 2.65* 3.82* 4.92* 5.63*  CALCIUM 8.7*  --  8.8*  --  9.6  --   --  9.3 9.1 9.2 9.2  PHOS 3.8  --  3.6  --  4.4  --   --  4.9* 6.0* 5.7* 6.5*   < > = values in this interval not displayed.   CBC Recent Labs  Lab 05/16/19 1233 05/16/19 1237 05/17/19 0511 05/17/19 0511 05/17/19 0636 05/17/19 0639 05/18/19 0304 05/19/19 0447  WBC 10.0  --  6.8  --   --   --  6.2 6.0  HGB 9.0*   < > 8.8*   < > 10.2* 9.5* 8.3* 8.5*  HCT 29.1*   < > 28.0*   < > 30.0* 28.0* 27.4* 28.0*  MCV 101.0*  --  100.4*  --   --   --  100.7* 100.4*  PLT 294  --  252  --   --    --  223 216   < > = values in this interval not displayed.    Medications:    . sodium chloride   Intravenous Once  . amiodarone  200 mg Per Tube BID  . aspirin  81 mg Per Tube Daily  . atorvastatin  40 mg Per Tube q1800  . chlorhexidine gluconate (MEDLINE KIT)  15 mL Mouth Rinse BID  . Chlorhexidine Gluconate Cloth  6 each Topical Q0600  . feeding supplement (PRO-STAT SUGAR FREE 64)  60 mL Per Tube TID  . FLUoxetine  20 mg Per Tube Daily  . insulin aspart  0-20 Units Subcutaneous Q4H  . insulin aspart  5 Units Subcutaneous Q4H  . insulin detemir  25 Units Subcutaneous BID  . mouth rinse  15 mL Mouth Rinse 10 times per day  . midodrine  15 mg Per Tube TID WC  . pantoprazole sodium  40  mg Per Tube Q1200  . QUEtiapine  25 mg Per Tube QHS  . sodium chloride flush  10-40 mL Intracatheter Q12H  . sodium chloride flush  3 mL Intravenous Q12H      Otelia Santee, MD 05/19/2019, 8:02 AM

## 2019-05-19 NOTE — Progress Notes (Signed)
  Speech Language Pathology Treatment: Dysphagia;Cognitive-Linquistic;Passy Damita Lack Speaking valve  Patient Details Name: Carlos Brewer. MRN: [id number] DOB: [birth year] Today's Date: 05/19/2019 Time: [id number] SLP Time Calculation (min) (ACUTE ONLY): 29 min  Assessment / Plan / Recommendation Clinical Impression  Excellent session today, pt able to tolerate PMSV for 30 minutes without change in vital signs. Pt able to articulate single words with visual model x4. Pt needed increased cueing to coordinate phonation and deep exhalation for audible volume; able to do so x5 but very soft. Volume na duration of phonation improved with cues for controlled deep inspiration with visual feedback and then transitioning to initiating phonation on exhalation. Each trial yielded longer, louder phonation; family able to hear on video call. Pt required min verbal cues to sustain attention to speaker on the right, easily distracted by environment. Pt able to follow complex commands repeatedly during session.  Pt also able to tolerate moderate sips of honey thick liquids and spoon bites of puree with prolonged oral phase but present transit and swallow response. Again verbal cues successful for improving pts effort and decreasing oral residue. Signs of aspiration present with large sips of water. Recommend instrumental assessment for diet initiation. Prognosis favorable.   HPI HPI: 34 yo male smoker found to have slurred speech and Rt sided weakness.  CT head showed M2 occlusion.  Ultimately treated with thrombolytic and thrombectomy by IR.  Required intubation for airway protection. Course complicated by septic shock, AKI requiring CRRT and polymorphic VT. Intubated 2/5-2/18.      SLP Plan  Continue with current plan of care(FEES today)       Recommendations  Diet recommendations: Dysphagia 1 (puree) Medication Administration: Via alternative means      Patient may use Passy-Muir Speech Valve: During  all therapies with supervision;During all waking hours (remove during sleep)(close intermittent supervision) PMSV Supervision: Intermittent MD: Please consider changing trach tube to : Smaller size;Cuffless         Oral Care Recommendations: Oral care BID Plan: Continue with current plan of care(FEES today)       GO               Herbie Baltimore, MA [place] Pager [phone] Office [phone]  Lynann Beaver 05/19/2019, 10:24 AM

## 2019-05-19 NOTE — Procedures (Signed)
Bronchoscopy Procedure Note Carlos Brewer [id number] 11/20/1985  Procedure: Bronchoscopy Indications: Diagnostic evaluation of the airways and for tracheostomy placement  Procedure Details Consent: Unable to obtain consent because of emergent medical necessity. Time Out: Verified patient identification, verified procedure, site/side was marked, verified correct patient position, special equipment/implants available, medications/allergies/relevent history reviewed, required imaging and test results available.  Performed  In preparation for procedure, patient was given 100% FiO2 and bronchoscope lubricated. Sedation: Benzodiazepines and fentanyl   Airway entered via ETT.  Carina visualized. ETT was pulled back to visualize entire procedure of perc trach placement.  Following procedure, bronch removed from ETT and inserted into new trach with carina visualized.   Evaluation Hemodynamic Status: BP stable throughout; O2 sats: stable throughout Patient's Current Condition: stable Specimens:  None Complications: No apparent complications Patient did tolerate procedure well.   Kennieth Rad, MSN, AGACNP-BC [facility] 05/19/2019, 9:40 PM

## 2019-05-19 NOTE — Procedures (Signed)
Tracheostomy Change Note  Patient Details:   Name: Carlos Brewer. DOB: 08/09/1985 MRN: [id number]    Airway Documentation:     Evaluation  O2 sats: stable throughout Complications: No apparent complications Patient did tolerate procedure well. Bilateral Breath Sounds: Clear, Diminished     RT along with RT Ashley Mariner removed patient's #6 Shiley cuffed and replaced with #4 Shiley cuffless per CCM order. Patient tolerated trach change well with no complications. Positive color change was noted on ETCO2 detector. Clear BBS. Vitals are stable. RT will continue to monitor.   Noga Fogg Clyda Greener 05/19/2019, 11:37 AM

## 2019-05-19 NOTE — Progress Notes (Signed)
eLink Physician-Brief Progress Note Patient Name: Carlos Brewer. DOB: 1985/12/08 MRN: [id number]   Date of Service  05/19/2019  HPI/Events of Note  Had his trach downsized today from Size 6 Shiley (cuffed) to Size 4 Shiley (uncuffed). He started having desats and RT is unable to pass a suction catheter but is able to bag-ventilate patient via the trach.  I suspect the distal end of the trach is abutting the tracheal wall leading to obstruction. While this is often a positional issue, neither supine nor upright positioning seems to improve the situation in this patient (suction catheter can't be passed regardless).   eICU Interventions  Needs bedside assessment +/- bronchoscopy to evaluate further. Bedside team informed.  RT to monitor airway and bag-ventilate via trach as needed until bedside team can evaluate.     Intervention Category Major Interventions: Respiratory failure - evaluation and management  Charlott Rakes 05/19/2019, 8:36 PM

## 2019-05-19 NOTE — Procedures (Addendum)
Intubation Procedure Note Carlos Brewer [id number] June 16, 1985  Procedure: Intubation Indications: Respiratory insufficiency For stable airway to reposition existing tracheostomy tube  Procedure Details Consent: Unable to obtain consent because of emergent medical necessity. Time Out: Verified patient identification, verified procedure, site/side was marked, verified correct patient position, special equipment/implants available, medications/allergies/relevent history reviewed, required imaging and test results available.  Performed  Maximum sterile technique was used including antiseptics, cap, gloves and hand hygiene.  MAC and 3 glidescope  8.0 ETT visually passed through cords, placed to 26 at lip and secured with tube holder.  Bilateral chest movement and ETCO2 change positive.   Evaluation Hemodynamic Status: BP stable throughout; O2 sats: stable throughout Patient's Current Condition: stable Complications: No apparent complications Patient did tolerate procedure well. Confirmed with Bronchoscopic direct evaluation: tube position acceptable.  Signed Dr Seward [facility] Critical [facility] Locums

## 2019-05-19 NOTE — Progress Notes (Signed)
Nutrition Follow-up  DOCUMENTATION CODES:   Obesity unspecified  INTERVENTION:   D/C Vital AF 1.2   Vital 1.5 @ 60 ml/hr (1440 ml/day) via Cortrak tube 60 ml Prostat BID  Provides: 2560 kcal, 157 grams protein, and 1100 ml free water.    NUTRITION DIAGNOSIS:   Inadequate oral intake related to inability to eat as evidenced by NPO status. Ongoing.   GOAL:   Patient will meet greater than or equal to 90% of their needs Meeting with TF.   MONITOR:   Vent status  REASON FOR ASSESSMENT:   Ventilator   ASSESSMENT:   Pt with PMH of CHF, OSA, poorly controlled DM, cocaine abuse admitted with L MCA stroke s/p tPA and IR thrombectomy.   Pt tolerating TC. Trach downsized plan for swallow eval today.  Renal following, now off CRRT and making some urine. Plan for fluid challenge and possible iHD if needed.   2/6 started on CRRT  2/18 s/p trach placement 2/19 Cortrak placement  Medications reviewed and include:  5 units novolog every 4 hours, 25 units Labs reviewed: K+ 3 (L), PO4: 6.5 (H), BUN/Cr: 86/5.63 (H) CBGs: [id number]   UOP: 450 x 24 hrs  Vital 1.2 @ 60 ml/hr and 60 ml Prostat TID Provides: 2328 kcal, 198 grams protein  Diet Order:   Diet Order            Diet NPO time specified  Diet effective now              EDUCATION NEEDS:   No education needs have been identified at this time  Skin:  Skin Assessment: Skin Integrity Issues: Skin Integrity Issues:: Stage II, Other (Comment) Stage II: R buttocks, penis Other: open wound buttocks  Last BM:  400 ml via rectal tube  Height:   Ht Readings from Last 1 Encounters:  05/12/2019 6\' 1"  (1.854 m)    Weight:   Wt Readings from Last 1 Encounters:  05/19/19 108.1 kg    Ideal Body Weight:  83.6 kg  BMI:  Body mass index is 31.44 kg/m.  Estimated Nutritional Needs:   Kcal:  2300-2500  Protein:  150-170 grams  Fluid:  2 L/day  Lockie Pares., RD, LDN, CNSC See AMiON for contact information

## 2019-05-19 NOTE — Procedures (Addendum)
Bronchoscopy Procedure Note Carlos Brewer PO:[id number] 11/14/1985  Procedure: Bronchoscopy Indications: Diagnostic evaluation of the airways  Trach had been down sized from 6 to 4 uncuffed trach earlier in the day and since developed hypoxia in the 80's and respiratory distress.  RT using BVM to oxygenate without improvement in saturations.   Procedure Details Consent: Unable to obtain consent because of emergent medical necessity. Time Out: Verified patient identification, verified procedure, site/side was marked, verified correct patient position, special equipment/implants available, medications/allergies/relevent history reviewed, required imaging and test results available.  Performed  In preparation for procedure, patient was given 100% FiO2 and bronchoscope lubricated. Sedation: not needed  Slim bronch inserted briefly in existing 4 shiley and found to be in a false lumen track.        Evaluation Hemodynamic Status: BP stable throughout; O2 sats: consistenly in 80-90's Patient's Current Condition: stable Specimens:  None Complications: No apparent complications Patient did tolerate procedure well.  Attending MD notified.  Placed on HFNC with improvement in saturations.  Prepping for intubation and beside perc trach.    Kennieth Rad, MSN, AGACNP-BC [facility] 05/19/2019, 10:43 PM

## 2019-05-20 ENCOUNTER — Inpatient Hospital Stay (HOSPITAL_COMMUNITY): Payer: No Typology Code available for payment source

## 2019-05-20 DIAGNOSIS — J9500 Unspecified tracheostomy complication: Secondary | ICD-10-CM

## 2019-05-20 DIAGNOSIS — Z978 Presence of other specified devices: Secondary | ICD-10-CM | POA: Diagnosis not present

## 2019-05-20 DIAGNOSIS — J9601 Acute respiratory failure with hypoxia: Secondary | ICD-10-CM | POA: Diagnosis not present

## 2019-05-20 DIAGNOSIS — I5023 Acute on chronic systolic (congestive) heart failure: Secondary | ICD-10-CM | POA: Diagnosis not present

## 2019-05-20 DIAGNOSIS — N170 Acute kidney failure with tubular necrosis: Secondary | ICD-10-CM | POA: Diagnosis not present

## 2019-05-20 LAB — POCT I-STAT 7, (LYTES, BLD GAS, ICA,H+H)
Acid-Base Excess: 1 mmol/L (ref 0.0–2.0)
Acid-Base Excess: 1 mmol/L (ref 0.0–2.0)
Bicarbonate: 25.6 mmol/L (ref 20.0–28.0)
Bicarbonate: 24.8 mmol/L (ref 20.0–28.0)
Bicarbonate: 27.1 mmol/L (ref 20.0–28.0)
Calcium, Ion: 0.99 mmol/L — ABNORMAL LOW (ref 1.15–1.40)
Calcium, Ion: 0.91 mmol/L — ABNORMAL LOW (ref 1.15–1.40)
Calcium, Ion: 0.93 mmol/L — ABNORMAL LOW (ref 1.15–1.40)
HCT: 28 % — ABNORMAL LOW (ref 39.0–52.0)
HCT: 28 % — ABNORMAL LOW (ref 39.0–52.0)
HCT: 30 % — ABNORMAL LOW (ref 39.0–52.0)
Hemoglobin: 9.5 g/dL — ABNORMAL LOW (ref 13.0–17.0)
Hemoglobin: 9.5 g/dL — ABNORMAL LOW (ref 13.0–17.0)
Hemoglobin: 10.2 g/dL — ABNORMAL LOW (ref 13.0–17.0)
O2 Saturation: 83 %
O2 Saturation: 91 %
O2 Saturation: 77 %
Patient temperature: 97.7
Patient temperature: 97.8
Potassium: 3.3 mmol/L — ABNORMAL LOW (ref 3.5–5.1)
Potassium: 3.7 mmol/L (ref 3.5–5.1)
Potassium: 3.8 mmol/L (ref 3.5–5.1)
Sodium: 139 mmol/L (ref 135–145)
Sodium: 139 mmol/L (ref 135–145)
Sodium: 140 mmol/L (ref 135–145)
TCO2: 27 mmol/L (ref 22–32)
TCO2: 26 mmol/L (ref 22–32)
TCO2: 29 mmol/L (ref 22–32)
pCO2 arterial: 41.9 mmHg (ref 32.0–48.0)
pCO2 arterial: 36.1 mmHg (ref 32.0–48.0)
pCO2 arterial: 50.9 mmHg — ABNORMAL HIGH (ref 32.0–48.0)
pH, Arterial: 7.392 (ref 7.350–7.450)
pH, Arterial: 7.443 (ref 7.350–7.450)
pH, Arterial: 7.334 — ABNORMAL LOW (ref 7.350–7.450)
pO2, Arterial: 47 mmHg — ABNORMAL LOW (ref 83.0–108.0)
pO2, Arterial: 58 mmHg — ABNORMAL LOW (ref 83.0–108.0)
pO2, Arterial: 45 mmHg — ABNORMAL LOW (ref 83.0–108.0)

## 2019-05-20 LAB — GLUCOSE, CAPILLARY
Glucose-Capillary: 146 mg/dL — ABNORMAL HIGH (ref 70–99)
Glucose-Capillary: 185 mg/dL — ABNORMAL HIGH (ref 70–99)
Glucose-Capillary: 188 mg/dL — ABNORMAL HIGH (ref 70–99)
Glucose-Capillary: 222 mg/dL — ABNORMAL HIGH (ref 70–99)
Glucose-Capillary: 187 mg/dL — ABNORMAL HIGH (ref 70–99)
Glucose-Capillary: 227 mg/dL — ABNORMAL HIGH (ref 70–99)

## 2019-05-20 LAB — POCT I-STAT, CHEM 8
BUN: 85 mg/dL — ABNORMAL HIGH (ref 6–20)
BUN: 80 mg/dL — ABNORMAL HIGH (ref 6–20)
BUN: 87 mg/dL — ABNORMAL HIGH (ref 6–20)
BUN: 68 mg/dL — ABNORMAL HIGH (ref 6–20)
BUN: 82 mg/dL — ABNORMAL HIGH (ref 6–20)
BUN: 58 mg/dL — ABNORMAL HIGH (ref 6–20)
BUN: 69 mg/dL — ABNORMAL HIGH (ref 6–20)
Calcium, Ion: 0.63 mmol/L — CL (ref 1.15–1.40)
Calcium, Ion: 0.57 mmol/L — CL (ref 1.15–1.40)
Calcium, Ion: 0.96 mmol/L — ABNORMAL LOW (ref 1.15–1.40)
Calcium, Ion: 0.55 mmol/L — CL (ref 1.15–1.40)
Calcium, Ion: 0.94 mmol/L — ABNORMAL LOW (ref 1.15–1.40)
Calcium, Ion: 0.47 mmol/L — CL (ref 1.15–1.40)
Calcium, Ion: 0.89 mmol/L — CL (ref 1.15–1.40)
Chloride: 98 mmol/L (ref 98–111)
Chloride: 99 mmol/L (ref 98–111)
Chloride: 99 mmol/L (ref 98–111)
Chloride: 97 mmol/L — ABNORMAL LOW (ref 98–111)
Chloride: 99 mmol/L (ref 98–111)
Chloride: 100 mmol/L (ref 98–111)
Chloride: 97 mmol/L — ABNORMAL LOW (ref 98–111)
Creatinine, Ser: 5.5 mg/dL — ABNORMAL HIGH (ref 0.61–1.24)
Creatinine, Ser: 5.1 mg/dL — ABNORMAL HIGH (ref 0.61–1.24)
Creatinine, Ser: 6.3 mg/dL — ABNORMAL HIGH (ref 0.61–1.24)
Creatinine, Ser: 4.4 mg/dL — ABNORMAL HIGH (ref 0.61–1.24)
Creatinine, Ser: 5.8 mg/dL — ABNORMAL HIGH (ref 0.61–1.24)
Creatinine, Ser: 4 mg/dL — ABNORMAL HIGH (ref 0.61–1.24)
Creatinine, Ser: 4.8 mg/dL — ABNORMAL HIGH (ref 0.61–1.24)
Glucose, Bld: 228 mg/dL — ABNORMAL HIGH (ref 70–99)
Glucose, Bld: 223 mg/dL — ABNORMAL HIGH (ref 70–99)
Glucose, Bld: 240 mg/dL — ABNORMAL HIGH (ref 70–99)
Glucose, Bld: 243 mg/dL — ABNORMAL HIGH (ref 70–99)
Glucose, Bld: 246 mg/dL — ABNORMAL HIGH (ref 70–99)
Glucose, Bld: 208 mg/dL — ABNORMAL HIGH (ref 70–99)
Glucose, Bld: 236 mg/dL — ABNORMAL HIGH (ref 70–99)
HCT: 28 % — ABNORMAL LOW (ref 39.0–52.0)
HCT: 29 % — ABNORMAL LOW (ref 39.0–52.0)
HCT: 31 % — ABNORMAL LOW (ref 39.0–52.0)
HCT: 26 % — ABNORMAL LOW (ref 39.0–52.0)
HCT: 30 % — ABNORMAL LOW (ref 39.0–52.0)
HCT: 29 % — ABNORMAL LOW (ref 39.0–52.0)
HCT: 32 % — ABNORMAL LOW (ref 39.0–52.0)
Hemoglobin: 9.5 g/dL — ABNORMAL LOW (ref 13.0–17.0)
Hemoglobin: 10.5 g/dL — ABNORMAL LOW (ref 13.0–17.0)
Hemoglobin: 9.9 g/dL — ABNORMAL LOW (ref 13.0–17.0)
Hemoglobin: 10.2 g/dL — ABNORMAL LOW (ref 13.0–17.0)
Hemoglobin: 8.8 g/dL — ABNORMAL LOW (ref 13.0–17.0)
Hemoglobin: 10.9 g/dL — ABNORMAL LOW (ref 13.0–17.0)
Hemoglobin: 9.9 g/dL — ABNORMAL LOW (ref 13.0–17.0)
Potassium: 3.4 mmol/L — ABNORMAL LOW (ref 3.5–5.1)
Potassium: 3.4 mmol/L — ABNORMAL LOW (ref 3.5–5.1)
Potassium: 3.5 mmol/L (ref 3.5–5.1)
Potassium: 3.8 mmol/L (ref 3.5–5.1)
Potassium: 3.8 mmol/L (ref 3.5–5.1)
Potassium: 3.6 mmol/L (ref 3.5–5.1)
Potassium: 3.7 mmol/L (ref 3.5–5.1)
Sodium: 140 mmol/L (ref 135–145)
Sodium: 139 mmol/L (ref 135–145)
Sodium: 140 mmol/L (ref 135–145)
Sodium: 138 mmol/L (ref 135–145)
Sodium: 140 mmol/L (ref 135–145)
Sodium: 140 mmol/L (ref 135–145)
Sodium: 140 mmol/L (ref 135–145)
TCO2: 25 mmol/L (ref 22–32)
TCO2: 24 mmol/L (ref 22–32)
TCO2: 26 mmol/L (ref 22–32)
TCO2: 26 mmol/L (ref 22–32)
TCO2: 27 mmol/L (ref 22–32)
TCO2: 27 mmol/L (ref 22–32)
TCO2: 27 mmol/L (ref 22–32)

## 2019-05-20 LAB — RENAL FUNCTION PANEL
Albumin: 2.3 g/dL — ABNORMAL LOW (ref 3.5–5.0)
Albumin: 2.4 g/dL — ABNORMAL LOW (ref 3.5–5.0)
Anion gap: 15 (ref 5–15)
Anion gap: 14 (ref 5–15)
BUN: 104 mg/dL — ABNORMAL HIGH (ref 6–20)
BUN: 77 mg/dL — ABNORMAL HIGH (ref 6–20)
CO2: 23 mmol/L (ref 22–32)
CO2: 24 mmol/L (ref 22–32)
Calcium: 8.9 mg/dL (ref 8.9–10.3)
Calcium: 8 mg/dL — ABNORMAL LOW (ref 8.9–10.3)
Chloride: 102 mmol/L (ref 98–111)
Chloride: 100 mmol/L (ref 98–111)
Creatinine, Ser: 7.14 mg/dL — ABNORMAL HIGH (ref 0.61–1.24)
Creatinine, Ser: 5.4 mg/dL — ABNORMAL HIGH (ref 0.61–1.24)
GFR calc Af Amer: 11 mL/min — ABNORMAL LOW (ref >60)
GFR calc Af Amer: 15 mL/min — ABNORMAL LOW (ref >60)
GFR calc non Af Amer: 9 mL/min — ABNORMAL LOW (ref >60)
GFR calc non Af Amer: 13 mL/min — ABNORMAL LOW (ref >60)
Glucose, Bld: 184 mg/dL — ABNORMAL HIGH (ref 70–99)
Glucose, Bld: 240 mg/dL — ABNORMAL HIGH (ref 70–99)
Phosphorus: 6.5 mg/dL — ABNORMAL HIGH (ref 2.5–4.6)
Phosphorus: 4 mg/dL (ref 2.5–4.6)
Potassium: 3.3 mmol/L — ABNORMAL LOW (ref 3.5–5.1)
Potassium: 3.8 mmol/L (ref 3.5–5.1)
Sodium: 140 mmol/L (ref 135–145)
Sodium: 138 mmol/L (ref 135–145)

## 2019-05-20 LAB — APTT: aPTT: 57 seconds — ABNORMAL HIGH (ref 24–36)

## 2019-05-20 LAB — COOXEMETRY PANEL
Carboxyhemoglobin: 1.8 % — ABNORMAL HIGH (ref 0.5–1.5)
Methemoglobin: 0.6 % (ref 0.0–1.5)
O2 Saturation: 99.4 %
Total hemoglobin: 8.2 g/dL — ABNORMAL LOW (ref 12.0–16.0)

## 2019-05-20 LAB — CBC
HCT: 25.1 % — ABNORMAL LOW (ref 39.0–52.0)
Hemoglobin: 7.9 g/dL — ABNORMAL LOW (ref 13.0–17.0)
MCH: 30.7 pg (ref 26.0–34.0)
MCHC: 31.5 g/dL (ref 30.0–36.0)
MCV: 97.7 fL (ref 80.0–100.0)
Platelets: 213 10*3/uL (ref 150–400)
RBC: 2.57 MIL/uL — ABNORMAL LOW (ref 4.22–5.81)
RDW: 14.3 % (ref 11.5–15.5)
WBC: 8.4 10*3/uL (ref 4.0–10.5)
nRBC: 0 % (ref 0.0–0.2)

## 2019-05-20 LAB — MAGNESIUM: Magnesium: 1.9 mg/dL (ref 1.7–2.4)

## 2019-05-20 LAB — CALCIUM, IONIZED: Calcium, Ionized, Serum: 5 mg/dL (ref 4.5–5.6)

## 2019-05-20 MED ORDER — SODIUM CHLORIDE 0.9 % IV SOLN: INTRAVENOUS | Status: DC | PRN

## 2019-05-20 MED ORDER — MIDAZOLAM HCL 2 MG/2ML IJ SOLN
INTRAMUSCULAR | Status: AC
  Filled 2019-05-20: qty 2

## 2019-05-20 MED ORDER — MIDAZOLAM HCL 2 MG/2ML IJ SOLN
2.0000 mg | Freq: Once | INTRAMUSCULAR | Status: AC
  Administered 2019-05-20: 2 mg via INTRAVENOUS

## 2019-05-20 MED ORDER — DEXTROSE 5 % IV SOLN
Status: DC
  Filled 2019-05-20 (×39): qty 1500

## 2019-05-20 MED ORDER — FENTANYL CITRATE (PF) 100 MCG/2ML IJ SOLN
INTRAMUSCULAR | Status: AC
  Administered 2019-05-20: 100 ug
  Filled 2019-05-20: qty 2

## 2019-05-20 MED ORDER — MIDAZOLAM HCL 2 MG/2ML IJ SOLN
INTRAMUSCULAR | Status: AC
  Administered 2019-05-20: 1 mg
  Filled 2019-05-20: qty 2

## 2019-05-20 MED ORDER — VECURONIUM BROMIDE 10 MG IV SOLR: 10.0000 mg | Freq: Once | INTRAVENOUS | Status: DC

## 2019-05-20 MED ORDER — FENTANYL 2500MCG IN NS 250ML (10MCG/ML) PREMIX INFUSION
0.0000 ug/h | INTRAVENOUS | Status: DC
  Administered 2019-05-20: 100 ug/h via INTRAVENOUS
  Administered 2019-05-21: 350 ug/h via INTRAVENOUS
  Administered 2019-05-21: 75 ug/h via INTRAVENOUS
  Administered 2019-05-21: 250 ug/h via INTRAVENOUS
  Administered 2019-05-23: 100 ug/h via INTRAVENOUS
  Filled 2019-05-20 (×5): qty 250

## 2019-05-20 MED ORDER — DEXTROSE 5 % IV SOLN
20.0000 g | INTRAVENOUS | Status: DC
  Administered 2019-05-20 – 2019-05-22 (×4): 20 g via INTRAVENOUS_CENTRAL
  Filled 2019-05-20 (×14): qty 200

## 2019-05-20 MED ORDER — PRISMASOL B22GK 4/0 22-4 MEQ/L IV SOLN
INTRAVENOUS | Status: DC
  Filled 2019-05-20 (×68): qty 5000

## 2019-05-20 MED ORDER — ACD FORMULA A 0.73-2.45-2.2 GM/100ML VI SOLN: 3000.0000 mL | Status: DC

## 2019-05-20 MED ORDER — PRISMASOL B22GK 4/0 22-4 MEQ/L REPLACEMENT SOLN
Status: DC
  Filled 2019-05-20 (×11): qty 5000

## 2019-05-20 MED ORDER — VECURONIUM BROMIDE 10 MG IV SOLR
INTRAVENOUS | Status: AC
  Administered 2019-05-20: 10 mg
  Filled 2019-05-20: qty 10

## 2019-05-20 NOTE — Progress Notes (Signed)
Trach downsized 3/4 to cuffless 4 after several days on trach collar He then required upsize again to #6 cuffed overnight 3/5 AM noted to have poor returning tidal volumes, bronchoscopy did not show any abnormalities and trach in good position Chest x-ray showed worsening acute pulmonary edema, required 100% FiO2  In the afternoon trach was changed to #6 cuffed distal XLT, this improved tidal volumes however hypoxia persisted requiring 100%/PEEP of 10.  I was called emergently to the bedside again now to assess for desaturation requiring bagging, frothy blood-tinged secretions being suctioned out Chest x-ray again obtained which showed edema pattern with tracheostomy in position, bilateral air entry noted, no pneumothorax or pneumomediastinum ABG shows hypoxia 100%/PEEP of 10 with PO2 58  Impression-acute pulmonary edema , trigger related to trach change events, possibly noncardiogenic, no new cardiac events in the last 24 hours Remains on CRRT  Plan -We will increase sedation goal RASS -3 to -4 with fentanyl drip and Versed as needed -Once deep sedation achieved will paralyzed with vecuronium x1 -Continue 100%/PEEP of 10, hopefully achieving vent synchrony will allow better oxygenation while volume removal continues with CRRT -If deep sedation not achieved with fentanyl drip alone and Versed pushes, then can use ketamine.  He does not tolerate Precedex or propofol very well due to hypotension  The patient is critically ill with multiple organ systems failure and requires high complexity decision making for assessment and support, frequent evaluation and titration of therapies, application of advanced monitoring technologies and extensive interpretation of multiple databases. Critical Care Time devoted to patient care services described in this note independent of APP/resident  time is 35 minutes.    Leanna Sato Elsworth Soho MD

## 2019-05-20 NOTE — Procedures (Signed)
Bronchoscopy Procedure Note Carlos Brewer [id number] 01-11-1986  Procedure: Bronchoscopy Indications: Diagnostic evaluation of the airways and emergent for trach placement  Procedure Details Consent: Risks of procedure as well as the alternatives and risks of each were explained to the (patient/caregiver).  Consent for procedure obtained. Time Out: Verified patient identification, verified procedure, site/side was marked, verified correct patient position, special equipment/implants available, medications/allergies/relevent history reviewed, required imaging and test results available.  Performed  In preparation for procedure, bronchoscope lubricated. Sedation: Benzodiazepines  Airway entered and the following bronchi were examined: trach   Procedures performed: Brushings performed Bronchoscope removed.    Evaluation Hemodynamic Status: BP stable throughout; O2 sats: stable throughout Patient's Current Condition: stable Specimens:  none Complications: No apparent complications Patient did tolerate procedure well.   Audria Nine 05/20/2019

## 2019-05-20 NOTE — Progress Notes (Signed)
Afternoon rounds  Trach changed earlier today.  Since the change he has had rather significant increase in work of breathing, + accessory use. PCXR w/ fairly marked increase in bilateral pulmonary edema.   I suspect that this might be mix of both negative pressure pulmonary edema and probably element of negative pressure edema as well which occurred during trach change  Plan Increase sedation Increase peep to 10 Change volume removal goal to 100-150 hr as bp allows  Erick Colace ACNP-BC [place] Pager # [phone] OR # [phone] if no answer

## 2019-05-20 NOTE — Progress Notes (Signed)
RT NOTE: RT called by RN due to patient desating and increased work of breathing. Patient also making grunting sound. Upon arrival RT placed patient back on ventilator at 100% and sats quickly went up to 100%. CC MD and RN also in room. RT placed ETCO2 on trach with resulting good color change. BBS heard. CCM performed bronchoscopy and also ordered stat chest x-ray as patient also losing volumes on ventilator. Vitals are stable. RT will continue to monitor.

## 2019-05-20 NOTE — Plan of Care (Signed)
?  Problem: Nutrition: ?Goal: Dietary intake will improve ?Outcome: Progressing ?  ?

## 2019-05-20 NOTE — Progress Notes (Signed)
NAME:  Carlos Radle., MRN:  [id number], DOB:  03-09-1986, LOS: 21 ADMISSION DATE:  04/24/2019, CONSULTATION DATE:  04/18/2019 REFERRING MD:  Dr. Leonel Ramsay, CHIEF COMPLAINT:  Slurred speech  Brief History   34 yo male smoker found to have slurred speech and Rt sided weakness.  Admitted with left MCA CVA and multiple smaller embolic infarcts s/p tPA and thrombectomy in IR.  Additionally found to have left ventricular  thrombus.    Course complicated by septic shock r/t HCAP, AKI requiring CRRT, polymorphic VT and cardiogenic shock.  Intermittent pressor dependence, required tracheostomy  Past Medical History  Systolic CHF with non ischemic CM, Cocaine abuse, OSA, DM Hx of CHF (EF 15%)  Hx myocarditis Smoker 1/2 ppd   Significant Hospital Events   2/05 Admit, tPA, IR thrombectomy 2/6 100% on fvent at 2300  Overnight requiring increasing levophed and phenylephrine.    2/6: switched to levophed, epi, started antibiotics, started on CRRT.  2/9  developed wide-complex tachycardia and hypotension, started on amiodarone drip, increase Levophed drip 2/15 drop in platelets, heparin stopped, bival started 2/17 Hypotensive and back on Levophed 2/18 trach, lines changed 2/19 started milrinone , fever 103 2/20 Episode of wide-complex tachycardia yesterday, changed from Levophed to vasopressin 2/22 stopping vanc. Still on inotrope support. Some AF w/ RVR. Had to be placed back on pressors. ivabradine added 2/23 still on pressors/ CRRT continued 2/24 tmax 98.4, Remains on CRRT, even UF, remains anuric, Levophed at 14 mcg/min, Milrinone at 0.125 mcg/kg/min, Coox 63.4 Doing well this morning on ATC, no events overnight.  Midodrine added   2/25 more interactive, remains on ATC, tmax 99.5/ WBC 21.4, ~900 ml liquid stool/ 24 hours, neg Cdiff, Levophed up to 20 mcg/min, CVP 2, ,milrinone remains at 0.125 mcg/kg/min, coox 92.7, Off CRRT since last night ~2000 s/p clotted x 3 off citrate, restarted    3/1 Amio drip restarted for WCT/atrial flutter, milrinone turned off 3/2 CRRT stopped, permacath placed  Consults:  Neuro IR Cardiology  Nephrology Heart Failure  EP   Procedures:  2/5-2/6:  TPA given at 428 am (total of 90 Mg) 520 am went to IR  S/P Lt common carotid arteriogram followed by complete revascularization of occluded LT MCA sup division mid M2 seg with x 1 pass with 65mx 40 mm solitaire X ret river device and penumbra aspiration with TICI 3 revascularization  ETT 2/05 >> 2/18 [facility] CVL 2/5 >>2/18  RT [place] CVL 2/18 >>3/2 [facility] HD cath 2/6 >>2/18 6 shiley cuffed trach 2/18  >>  RT fem HD 2/19 >>3/2 [facility] permacath 3/2>>  Significant Diagnostic Tests:  CT angio head/neck 2/05 >> occlusion of Lt MCA bifurcation Echo 2/05 >> EF less than 20%, cannot rule out apical thrombus MRI brain 2/11 > extensive acute infarction of multiple areas without large or medium vessel occlusion Echocardiogram 2/8 Left ventricular ejection fraction, by estimation, is <20%. The left  ventricle has severely decreased function. The left ventricle demonstrates  global hypokinesis. The left ventricular internal cavity size was severely  dilated. Possible small 0.8 x  0.6 cm apical thrombus. Somewhat subtle finding, could confirm with  cardiac MRI. However, if patient has had CVA, would be reasonable to  anticoagulate given severity of LV dysfunction if no contraindications to  anticoagulation.   Micro Data:  SARS CoV2 PCR 2/05 >> negative Influenza PCR 2/05 >> negative Urine 2/6 >> ng resp 2/6 >> nml flora BC 2/6 >>ng resp 2/16 >> [facility] >> negative  BC x 2 2/25 >>ng Cdiff 2/25 >> neg  Antimicrobials:  mero 2/6 -2/12 Zosyn 2/6 , 2/19 >> 2/23 vanc 2/6 >> 2/9 , 2/19 >>2/22  Interim history/subjective:  3/5: events last pm reviewed, trach was downsized earlier in day to uncuffed shiley4 but pt had desaturations and inability to pass suction catheter, bronch revealed dislodging of  trach and pt was intubated from oral and perc trach redone with a 6 shiley cuffed placed. This am pt with grunting and increased wob on atc. Changed back to vent with loss of volumes. Stat cxr stable and emergent bronch eval with good position and no tissue seen overlaping trach.  3/4: Continues on trach collar Afebrile Remains off CRRT Loose stools, with Flexi-Seal   Objective   Blood pressure 100/62, pulse 97, temperature 98.7 F (37.1 C), temperature source Axillary, resp. rate (!) 21, height 6' 1" (1.854 m), weight 111.5 kg, SpO2 96 %.    Vent Mode: PRVC FiO2 (%):  [21 %-80 %] 28 % Set Rate:  [18 bmp] 18 bmp Vt Set:  [640 mL] 640 mL PEEP:  [5 cmH20] 5 cmH20 Plateau Pressure:  [20 cmH20] 20 cmH20   Intake/Output Summary (Last 24 hours) at 05/20/2019 1023 Last data filed at 05/20/2019 0700 Gross per 24 hour  Intake 2541.37 ml  Output 1000 ml  Net 1541.37 ml   Filed Weights   05/18/19 0500 05/19/19 0500 05/20/19 1000  Weight: 110.1 kg 108.1 kg 111.5 kg    Examination: Unchanged General: Well-built, well-nourished, appears in acute distress with increased wob.  HEENT: [place]/AT trach midline, MM pink/moist, mild pallor, no icterus Neuro: Alert and interactive, able to follow simple commands , moves all 4 extremities, restless CV: s1s2 regular rate and rhythm, no murmur, rubs, or gallops,  PULM:  Clear to ascultation bilaterally,+ accessory muscle use, mild secretions GI: soft, bowel sounds active in all 4 quadrants, non-tender, non-distended Extremities: warm/dry, no  edema  Skin: no rashes or lesions  Lab Results  Component Value Date   WBC 8.4 05/20/2019   HGB 7.9 (L) 05/20/2019   HCT 25.1 (L) 05/20/2019   MCV 97.7 05/20/2019   PLT 213 05/20/2019   Lab Results  Component Value Date   CREATININE 7.14 (H) 05/20/2019   BUN 104 (H) 05/20/2019   NA 140 05/20/2019   K 3.3 (L) 05/20/2019   CL 102 05/20/2019   CO2 23 05/20/2019       Resolved Hospital Problem list      HCAP 2/19- 2/23 zosyn/ vanc  Assessment & Plan:   Left MCA CVA Multiple other infarcts noted on MRI 2/11-likely embolic Acute metabolic encephalopathy -resolved P:  aggressive PT Dc  Seroquel , use Ambien as needed for insomnia  Acute systolic HF with cardiogenic shock in setting of NICM, ejection fraction is less than 20% P: Heart failure team following, appreciate assistance On midodrine -coox 99   Left ventricle apical thrombus non ischemic cardiomyopathy with systolic congestive heart failure Polymorphic VT on 2/9, WCT 2/19 , 2/28 HLD P: Primary management per cardiology  Cont oral amiodarone  Daily ASA and Lipitor     Acute hypoxemic respiratory failure now tracheostomy dependent after stroke - last on MV 2/21 Plan Continue aggressive pulmonary hygiene  -see event notes from overnight -shiley 6 cuffed replaced, re-eval'd this am.  -atc as tolerated SLP following , and progress with PMV valve Advance swallow   Acute kidney injury -starting to make urine - CRRT started 2/6 Plan Nephrology following    Back on crrt Hypokalemia will be repleted per nephro   HIT without thrombosis but needs anticoagulation for embolic CVA/ LV thrombus - platelets recovered -SRA positive at 90 Plan Avoid heparin Continue Bivalirdin -transition to coumadin eventually once able to consistently stay off vent and take po Normocytic anemia:  -stable hgb -no overt bleeding and no acute indication for transfusion   DM type II with hyperglycemia Plan improved SSI   Hx of cocaine abuse. Depression. - not active per UDS  Plan Continue Prozac    Best practice:  Diet: NPO,  TFs DVT prophylaxis: SCDs/ bivalirudin GI prophylaxis: protonix Mobility: progress when able Code Status: full code Disposition: ICU  Family -mother update 3/5 at bedside.   Critical care time: The patient is critically ill with multiple organ systems failure and requires high complexity decision  making for assessment and support, frequent evaluation and titration of therapies, application of advanced monitoring technologies and extensive interpretation of multiple databases.  Critical care time 40 mins. This represents my time independent of the NPs time taking care of the pt. This is excluding procedures.    [facility] 05/20/2019, 10:24 AM

## 2019-05-20 NOTE — Progress Notes (Signed)
Sputum Sample collected and sent to lab.

## 2019-05-20 NOTE — Progress Notes (Signed)
This note also relates to the following rows which could not be included: SpO2 - Cannot attach notes to unvalidated device data  FiO2 increased to 35% ATC, but pt continued to have episodes where he would desat into the 80's, and HR increased. Wean terminated at this time and pt placed back on full support.

## 2019-05-20 NOTE — Progress Notes (Signed)
Physical Therapy Treatment Patient Details Name: Carlos Brewer. MRN: [id number] DOB: 09-Jan-1986 Today's Date: 05/20/2019    History of Present Illness Pt is a 34 y/o male smoker who initially presented on 2/5 with slurred speech and Rt sided weakness. Admitted with left MCA CVA and multiple smaller embolic infarcts s/p tPA and thrombectomy in IR.  Additionally found to have left ventricular  thrombus. Hospital course complicated by septic shock r/t HCAP, AKI requiring CRRT, polymorphic VT and cardiogenic shock. Echo EF < 20%. Pt with Intermittent pressor dependence, required tracheostomy.     PT Comments    Patient with limited progress due to respiratory issues overnight and continued with increased WOB during session on trach collar.  Per RN to start CRRT today.  Still able to participate in bedside PT with sit to stand x 2 and changing linens.  PT to continue to follow acutely.   Follow Up Recommendations  CIR;Supervision/Assistance - 24 hour     Equipment Recommendations  Other (comment)(TBA)    Recommendations for Other Services       Precautions / Restrictions Precautions Precautions: Fall Precaution Comments: flexiseal, NG Restrictions Weight Bearing Restrictions: No    Mobility  Bed Mobility Overal bed mobility: Needs Assistance Bed Mobility: Rolling;Sidelying to Sit;Sit to Supine Rolling: Mod assist Sidelying to sit: Mod assist;+2 for safety/equipment     Sit to sidelying: Max assist;+2 for safety/equipment;+2 for physical assistance General bed mobility comments: Pt requiring Mod A to roll to right side and then facilitate BLEs over EOB and elevate trunk. Max A +2 for returning to support wiht support for trunk and BLEs.   Transfers Overall transfer level: Needs assistance Equipment used: (back of recliner with handles to pull up) Transfers: Sit to/from Stand Sit to Stand: Mod assist;+2 safety/equipment;From elevated surface;+2 physical assistance          General transfer comment: Mod A +2 to power up into standing with lifting help from hips, pt pulling up on back of recliner  Ambulation/Gait             General Gait Details: cues for side steps toward [facility], but pt unable to take steps   Stairs             Wheelchair Mobility    Modified Rankin (Stroke Patients Only) Modified Rankin (Stroke Patients Only) Pre-Morbid Rankin Score: No symptoms Modified Rankin: Severe disability     Balance Overall balance assessment: Needs assistance Sitting-balance support: Feet supported Sitting balance-Leahy Scale: Fair Sitting balance - Comments: fatiguing leaning on L elbow, close S for safety                                    Cognition Arousal/Alertness: Awake/alert Behavior During Therapy: Flat affect;WFL for tasks assessed/performed Overall Cognitive Status: Difficult to assess                                 General Comments: Pt shacking his head yes/no to questions ~25% of time. Following simple commands. Pt with flat expression. Will continue to assess.      Exercises      General Comments General comments (skin integrity, edema, etc.): HR elevated to 120's SpO2 dropping to 80's, but poor pleth on monitor, RR up to 30's      Pertinent Vitals/Pain Pain Assessment: Faces Faces Pain Scale: No hurt  Pain Intervention(s): Monitored during session    Home Living                      Prior Function            PT Goals (current goals can now be found in the care plan section) Acute Rehab PT Goals Patient Stated Goal: to d/c home with mother after rehab Progress towards PT goals: Progressing toward goals    Frequency    Min 4X/week      PT Plan Current plan remains appropriate    Co-evaluation PT/OT/SLP Co-Evaluation/Treatment: Yes Reason for Co-Treatment: For patient/therapist safety;To address functional/ADL transfers PT goals addressed during session:  Mobility/safety with mobility;Balance;Strengthening/ROM OT goals addressed during session: ADL's and self-care      AM-PAC PT "6 Clicks" Mobility   Outcome Measure  Help needed turning from your back to your side while in a flat bed without using bedrails?: A Lot Help needed moving from lying on your back to sitting on the side of a flat bed without using bedrails?: A Lot Help needed moving to and from a bed to a chair (including a wheelchair)?: A Lot Help needed standing up from a chair using your arms (e.g., wheelchair or bedside chair)?: A Lot Help needed to walk in hospital room?: Total Help needed climbing 3-5 steps with a railing? : Total 6 Click Score: 10    End of Session Equipment Utilized During Treatment: Oxygen(trach collar) Activity Tolerance: Patient limited by fatigue Patient left: in bed;with call bell/phone within reach;with nursing/sitter in room   PT Visit Diagnosis: Other abnormalities of gait and mobility (R26.89);Muscle weakness (generalized) (M62.81);Other symptoms and signs involving the nervous system (M08.676)     Time: 1950-9326 PT Time Calculation (min) (ACUTE ONLY): 26 min  Charges:  $Therapeutic Activity: 8-22 mins                     Magda Kiel, PT Acute Rehabilitation Services [phone] 05/20/2019    Reginia Naas 05/20/2019, 1:03 PM

## 2019-05-20 NOTE — Progress Notes (Signed)
Wasted 85 ml of Ketamine 500mg  in 100 ml with Marica Otter.

## 2019-05-20 NOTE — Progress Notes (Signed)
ANTICOAGULATION CONSULT NOTE  Pharmacy Consult:  Bivalirudin Indication: stroke 2/5, LV thrombus 2/8, HIT 2/15   Allergies  Allergen Reactions  . Heparin Other (See Comments)    Heparin induced thrombocytopenia. 2/15 HIT OD 1.692. 2/16 SRA positive-90.     Patient Measurements: Height: 6\' 1"  (185.4 cm) Weight: 238 lb 5.1 oz (108.1 kg) IBW/kg (Calculated) : 79.9   Vital Signs: Temp: 99 F (37.2 C) (03/05 0400) Temp Source: Oral (03/05 0400) BP: 100/62 (03/05 0700) Pulse Rate: 97 (03/05 0700)  Labs: Recent Labs    05/18/19 0304 05/18/19 0600 05/18/19 1711 05/19/19 0447 05/19/19 1348 05/19/19 1627 05/19/19 1844 05/20/19 0612  HGB 8.3*   < >  --  8.5*  --   --   --  7.9*  HCT 27.4*  --   --  28.0*  --   --   --  25.1*  PLT 223  --   --  216  --   --   --  213  APTT  --    < >   < >  --  49*  --  55* 57*  CREATININE 3.82*   < >  --  5.63*  --  6.20*  --  7.14*   < > = values in this interval not displayed.    Estimated Creatinine Clearance: 18.8 mL/min (A) (by C-G formula based on SCr of 7.14 mg/dL (H)).  Assessment: 34 yr old male with left MCA infarct - received TPA and revascularization with IR on 2/5. On 2/8 found to have small LV apical thrombus on ECHO. Pharmacy consulted to dose IV heparin, which was switched to bivalirudin 2/15 for HIT. 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 SRA very positive at 51. Heparin allergy has appropriately been added to chart and updated with SRA results.   APTT is therapeutic this morning at 57; no bleeding reported. Hemoglobin trending down slightly at 7.9. Platelet count WNL.   Goal of Therapy:  APTT 50-65 sec  Monitor platelets by anticoagulation protocol: Yes   Plan:  - Continue bivalirudin infusion at 0.07 mg/kg/hr - Daily aPTT and CBC - Monitor for s/sx of bleeding - F/u transition to warfarin  [name], PharmD PGY1 Pharmacy Resident

## 2019-05-20 NOTE — Procedures (Signed)
Arterial Catheter Insertion Procedure Note Carlos Brewer. [id number] 1985/05/11  Procedure: Insertion of Arterial Catheter  Indications: Blood pressure monitoring and Frequent blood sampling  Procedure Details Consent: Unable to obtain consent because of altered level of consciousness. Time Out: Verified patient identification, verified procedure, site/side was marked, verified correct patient position, special equipment/implants available, medications/allergies/relevent history reviewed, required imaging and test results available.  Performed  Maximum sterile technique was used including antiseptics, cap, gloves, gown, hand hygiene, mask and sheet. Skin prep: Chlorhexidine; local anesthetic administered 20 gauge catheter was inserted into right radial artery using the Seldinger technique. ULTRASOUND GUIDANCE USED: NO Evaluation Blood flow good; BP tracing good. Complications: No apparent complications.   Carlos Brewer 05/20/2019

## 2019-05-20 NOTE — Progress Notes (Signed)
[facility] Progress Note     Assessment/ Plan:   1. AKIpresumably due to ATN with ischemic/nephrotoxic insults worsened by cardiogenic shock. Started on CRRT 04/23/19. Resumed CVVHD2/19/21 due to hyperkalemiawhich has resolved. After trial of CRRT without citrate with clotting x multiple in short time, CRRT was initially held overnight. Then resumed on 2/25-3/2with citrate for clearance.  1. He's actually making but not getting clearance. 2. Failed  fluid challenge -> plan on challenging with iHD Friday but unfortunately due to staffing issues we will have to use CRRT. 3. Goal is to challenge in iHD early  Next week. 4. Has had clotting issues when off citrate even on angiomax. 2. Cardiogenic shock- Hx milrinone due to development of a fib with rvr and wide complex tachycardia. -Known severe NICM with EF <20% andmay not tolerate but also can't continue CRRT indefinitely. - If he doesn't tolerate iHD will need meeting with mother to define goals of care.  3. Leukocytosis - improving last check. abx per primary team.  Blood cultures sent 2/25 - listed as NGTD though low volume. 4. Acute stroke- presumed embolic with bilateral infarcts and LV thrombus- received tpa on admission 5. SVT/polymorphic VT- per Cardiology 6. Acute hypoxic respiratory failure- s/p trach  7. 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 HIT Ab+on Argatroban. 8. Disposition- overall prognosis is poor with ongoing RRT (has not tolerated stopping CVVHD due to hypercatabolic state and concern for poor tolerance of intermittent HD), trach, as well as cardiogenic shock/cardiomyopathy.  Subjective:   Off milrinone and levo uop 450/450/500  BP still very soft   Objective:   BP 100/62   Pulse 97   Temp 99 F (37.2 C) (Oral)   Resp (!) 21   Ht 6' 1" (1.854 m)   Wt 108.1 kg   SpO2 96%   BMI 31.44 kg/m   Intake/Output Summary (Last 24 hours) at 05/20/2019 0800 Last data filed at  05/20/2019 0700 Gross per 24 hour  Intake 2595.2 ml  Output 1000 ml  Net 1595.2 ml   Weight change:   Physical Exam: Gen: awake in bed, trach collar, nonverbal. HEENT - trach in place CVS: S1S2 no rub Resp:reduced breath sounds Abd: soft/ND Ext: he has no edema Access: rightIJtunneled catheter  Imaging: Portable Chest x-ray  Result Date: 05/19/2019 CLINICAL DATA:  34 year old male with shortness of breath. EXAM: PORTABLE CHEST 1 VIEW COMPARISON:  Chest radiograph dated 05/10/2019. FINDINGS: Tracheostomy with tip approximately 5 cm above the carina. A feeding tube extends below the diaphragm with tip beyond the inferior margin of the image. Right sided dialysis catheter with tip in the region of the cavoatrial junction. There is diffuse interstitial and vascular prominence and hazy airspace opacity most consistent with edema. Pneumonia is not excluded. Clinical correlation is recommended. No large pleural effusion. No pneumothorax. There is cardiomegaly. No acute osseous pathology. IMPRESSION: Cardiomegaly with findings of edema or fluid overload. Pneumonia is not excluded. Clinical correlation is recommended. Electronically Signed   By: Anner Crete M.D.   On: 05/19/2019 21:57    Labs: BMET Recent Labs  Lab 05/17/19 0511 05/17/19 0636 05/17/19 0639 05/17/19 1705 05/18/19 0304 05/18/19 1700 05/19/19 0447 05/19/19 1627 05/20/19 0612  NA 140   < > 139 140 138 139 139 139 140  K 3.1*   < > 3.5 3.2* 2.9* 3.1* 3.0* 3.6 3.3*  CL 93*   < > 93* 97* 94* 97* 97* 99 102  CO2 33*  --   --  _0 21* 23  GLUCOSE 118*   < > 141* 106* 121* 156* 184* 167* 184*  BUN 28*   < > 28* 25* 46* 64* 86* 95* 104*  CREATININE 2.34*   < > 2.30* 2.65* 3.82* 4.92* 5.63* 6.20* 7.14*  CALCIUM 9.6  --   --  9.3 9.1 9.2 9.2 9.0 8.9  PHOS 4.4  --   --  4.9* 6.0* 5.7* 6.5* 5.3* 6.5*   < > = values in this interval not displayed.   CBC Recent Labs  Lab 05/17/19 0511 05/17/19 0636  05/17/19 0639 05/18/19 0304 05/19/19 0447 05/20/19 0612  WBC 6.8  --   --  6.2 6.0 8.4  HGB 8.8*   < > 9.5* 8.3* 8.5* 7.9*  HCT 28.0*   < > 28.0* 27.4* 28.0* 25.1*  MCV 100.4*  --   --  100.7* 100.4* 97.7  PLT 252  --   --  223 216 213   < > = values in this interval not displayed.    Medications:    . sodium chloride   Intravenous Once  . amiodarone  200 mg Per Tube BID  . aspirin  81 mg Per Tube Daily  . atorvastatin  40 mg Per Tube q1800  . chlorhexidine gluconate (MEDLINE KIT)  15 mL Mouth Rinse BID  . Chlorhexidine Gluconate Cloth  6 each Topical Q0600  . feeding supplement (PRO-STAT SUGAR FREE 64)  60 mL Per Tube BID  . fentaNYL      . FLUoxetine  20 mg Per Tube Daily  . insulin aspart  0-20 Units Subcutaneous Q4H  . insulin aspart  5 Units Subcutaneous Q4H  . insulin detemir  25 Units Subcutaneous BID  . mouth rinse  15 mL Mouth Rinse 10 times per day  . midodrine  15 mg Per Tube TID WC  . pantoprazole sodium  40 mg Per Tube Q1200  . sodium chloride flush  10-40 mL Intracatheter Q12H  . sodium chloride flush  3 mL Intravenous Q12H      Otelia Santee, MD 05/20/2019, 8:00 AM

## 2019-05-20 NOTE — Progress Notes (Addendum)
Occupational Therapy Treatment Patient Details Name: Carlos Brewer. MRN: [id number] DOB: [birth year] Today's Date: 05/20/2019    History of present illness Pt is a 34 y/o male smoker who initially presented on 2/5 with slurred speech and Rt sided weakness. Admitted with left MCA CVA and multiple smaller embolic infarcts s/p tPA and thrombectomy in IR.  Additionally found to have left ventricular  thrombus. Hospital course complicated by septic shock r/t HCAP, AKI requiring CRRT, polymorphic VT and cardiogenic shock. Echo EF < 20%. Pt with Intermittent pressor dependence, required tracheostomy.    OT comments   Pt progressing towards established OT goals and continues to present with increased motivation to participate in therapy. Pt continues to present with decreased balance, strength, cognition, and activity tolerance. Pt performing bed mobility with Mod-Max A +2 and tolerating sitting at EOB for ~10 minutes. Pt performing sit<>stand twice with Mod A +2. Pt requiring Max A +2 for peri care in standing with slight leak at flexiseal; RN notified. Continues to recommend dc to CIR for intensive OT and will continue to follow acutely as admitted.    Follow Up Recommendations  CIR;Supervision/Assistance - 24 hour    Equipment Recommendations  Other (comment)(TBD)    Recommendations for Other Services      Precautions / Restrictions Precautions Precautions: Fall Precaution Comments: flexiseal, NG Restrictions Weight Bearing Restrictions: No       Mobility Bed Mobility Overal bed mobility: Needs Assistance Bed Mobility: Rolling;Sidelying to Sit;Sit to Supine Rolling: Mod assist Sidelying to sit: Mod assist;+2 for safety/equipment     Sit to sidelying: Max assist;+2 for safety/equipment;+2 for physical assistance General bed mobility comments: Pt requiring Mod A to roll to right side and then facilitate BLEs over EOB and elevate trunk. Max A +2 for returning to support wiht  support for trunk and BLEs.   Transfers Overall transfer level: Needs assistance Equipment used: (recliner position away from pt for him to hold back) Transfers: Sit to/from Stand Sit to Stand: Mod assist;+2 safety/equipment;From elevated surface;+2 physical assistance         General transfer comment: Mod A +2 to power up into standing with bilateral knees blocks and use of pad to lift hips    Balance Overall balance assessment: Needs assistance Sitting-balance support: Feet supported;Single extremity supported Sitting balance-Leahy Scale: Fair                                     ADL either performed or assessed with clinical judgement   ADL Overall ADL's : Needs assistance/impaired Eating/Feeding: NPO                       Toilet Transfer: Moderate assistance;+2 for physical assistance(sit<>stand at EOB)   Toileting- Clothing Manipulation and Hygiene: Maximal assistance;+2 for physical assistance Toileting - Clothing Manipulation Details (indicate cue type and reason): Max A for peri care after leak from flexi seal. Second person to assist in maintaining balance while standing.        General ADL Comments: Pt tolerating sit<>stand twice to change sheets and perform peri care. Pt requiring seat break in between stands with therapy providing back support. Continues to present with decreased balance, strength, and cognition.     Vision       Perception     Praxis      Cognition Arousal/Alertness: Awake/alert Behavior During Therapy: Flat affect;WFL for tasks assessed/performed  Overall Cognitive Status: Difficult to assess                                 General Comments: Pt shacking his head yes/no to questions ~25% of time. Following simple commands. Pt with flat expression. Will continue to assess.        Exercises     Shoulder Instructions       General Comments HR elevating to 120s. SpO2 dropping to 80s however with  poor pleth line; trach collar on. RR elevating to 30s and noting heavy breathing.    Pertinent Vitals/ Pain       Pain Assessment: Faces Faces Pain Scale: No hurt Pain Intervention(s): Monitored during session  Home Living                                          Prior Functioning/Environment              Frequency  Min 2X/week(will benefit from 3x)        Progress Toward Goals  OT Goals(current goals can now be found in the care plan section)  Progress towards OT goals: Progressing toward goals  Acute Rehab OT Goals Patient Stated Goal: to d/c home with mother after rehab OT Goal Formulation: With patient Time For Goal Achievement: 06/01/19 Potential to Achieve Goals: Good ADL Goals Pt Will Perform Grooming: with modified independence;sitting Pt Will Perform Upper Body Dressing: sitting;with supervision Pt/caregiver will Perform Home Exercise Program: Increased strength;Increased ROM;Both right and left upper extremity;With Supervision;With written HEP provided Additional ADL Goal #1: Pt will maintain sitting balance EOB >10 min at supervision level in prep for ADL. Additional ADL Goal #2: Pt will follow multi step commands with 75% accuracy during functional task completion. Additional ADL Goal #3: Pt will perform bed mobility with minguard assist as precursor to EOB/OOB ADL.  Plan Discharge plan remains appropriate    Co-evaluation    PT/OT/SLP Co-Evaluation/Treatment: Yes Reason for Co-Treatment: Complexity of the patient's impairments (multi-system involvement);For patient/therapist safety;To address functional/ADL transfers   OT goals addressed during session: ADL's and self-care      AM-PAC OT "6 Clicks" Daily Activity     Outcome Measure   Help from another person eating meals?: Total(NPO) Help from another person taking care of personal grooming?: A Lot Help from another person toileting, which includes using toliet, bedpan, or  urinal?: Total Help from another person bathing (including washing, rinsing, drying)?: A Lot Help from another person to put on and taking off regular upper body clothing?: A Lot Help from another person to put on and taking off regular lower body clothing?: Total 6 Click Score: 9    End of Session Equipment Utilized During Treatment: Oxygen;Gait belt  OT Visit Diagnosis: Muscle weakness (generalized) (M62.81);Other abnormalities of gait and mobility (R26.89)   Activity Tolerance Patient tolerated treatment well   Patient Left in bed;with call bell/phone within reach;with nursing/sitter in room   Nurse Communication Mobility status        Time: 8144-8185 OT Time Calculation (min): 26 min  Charges: OT General Charges $OT Visit: 1 Visit OT Treatments $Self Care/Home Management : 8-22 mins  [place], OTR/L Acute Rehab Pager: [phone] Office: [place] 05/20/2019, 10:10 AM

## 2019-05-20 NOTE — Progress Notes (Signed)
Chaplain engaged in follow-up visit with [name]' mom.  His mother would like for [name]' girlfriend's access to medical updates to be limited.  Mother is worried about the ways in which girlfriend has been communicating to medical staff.  Chaplain suggests changing password, allowing Mother and who she chooses to have access to medical information.  Mother agrees that it is ok for girlfriend to still utilize E-link to see Carlos Brewer and offer support.   Chaplain will pass information along to Unit Chaplain for follow-up.

## 2019-05-20 NOTE — Progress Notes (Signed)
Updated mother Langley Gauss on the events of the evening. She requested to speak with a Education officer, museum when available during the day.

## 2019-05-20 NOTE — Progress Notes (Signed)
Assisted tele visit to patient with partner.  Adam Phenix, RN

## 2019-05-20 NOTE — Progress Notes (Signed)
Pt awake post bedside trach procedure. Pt placed back on 28% ATC and is tolerating well at this time. Moderate amount of bloody secretions. RT will continue to monitor.

## 2019-05-20 NOTE — Clinical Social Work Note (Signed)
CSW spoke with patient's mom Carlos Brewer per her request. She is requesting comfirmation about who is the decision maker for the patient.   CSW reviewed patient's chart and on 2/5 patient told MD that if he were not able to make decisions he would like his fiancee Carlos Brewer to make them on his behalf.   CSW informed Carlos Brewer of this. Carlos Brewer disagreed stating I am his mother, but that is okay I have money I will get an attorney.   Carlos Brewer is requesting a copy of the patient bill of rights. CSW will inform unit RN.

## 2019-05-20 NOTE — Procedures (Signed)
Tracheostomy tube change:  Verbal timeout was performed prior to the procedure. A guide wire was placed thru The old  #6  Cuffed  trach was carefully removed. the tracheostomy site appeared: unremarkable. A new #  6 Cuffed distal XLT  trach was easily placed in the tracheostomy stoma over the guide wire using Rhino dilator and secured with velcro trach ties. The tracheostomy was patent, good color change observed via EZ-CAP, and the patient was easily able to voice with finger occlusion and tolerated the procedure well with no immediate complications.   Erick Colace ACNP-BC [place] Pager # [phone] OR # [phone] if no answer

## 2019-05-20 NOTE — Progress Notes (Signed)
SLP Cancellation Note  Patient Details Name: Carlos Brewer. MRN: [id number] DOB: 19-Nov-1985   Cancelled treatment:       Reason Eval/Treat Not Completed: Patient not medically ready;Medical issues which prohibited therapy. Pt on ventilator this pm.    Maxx Pham, Katherene Ponto 05/20/2019, 1:03 PM

## 2019-05-20 NOTE — Progress Notes (Addendum)
Advanced Heart Failure Rounding Note   Subjective:    Trach changed earlier today>>developed increased WOB. F/U CXR w/ bilateral pulmonary edema.   Remains on CVVHD. UF goal increased to 100-150 hr. Oliguric 500cc in UOP yesterday  Remains on midodrine for BP support.   Currently sedated    Objective:   Weight Range:  Vital Signs:   Temp:  [97.7 F (36.5 C)-99 F (37.2 C)] 97.7 F (36.5 C) (03/05 1200) Pulse Rate:  [32-123] 99 (03/05 1545) Resp:  [16-37] 25 (03/05 1545) BP: (82-131)/(58-99) 86/63 (03/05 1545) SpO2:  [67 %-100 %] 100 % (03/05 1545) FiO2 (%):  [21 %-100 %] 100 % (03/05 1332) Weight:  [111.5 kg] 111.5 kg (03/05 1000) Last BM Date: 05/20/19  Weight change: Filed Weights   05/18/19 0500 05/19/19 0500 05/20/19 1000  Weight: 110.1 kg 108.1 kg 111.5 kg    Intake/Output:   Intake/Output Summary (Last 24 hours) at 05/20/2019 1621 Last data filed at 05/20/2019 1500 Gross per 24 hour  Intake 2666.47 ml  Output 1095 ml  Net 1571.47 ml    PHYSICAL EXAM: General:  Sedated on TC. No respiratory difficulty HEENT: normal Neck: supple. +TC no JVD. Carotids 2+ bilat; no bruits. No lymphadenopathy or thyromegaly appreciated. Cor: PMI nondisplaced. Regular rate & rhythm. No rubs, gallops or murmurs. Lungs: course BS bilaterally  Abdomen: soft, nontender, nondistended. No hepatosplenomegaly. No bruits or masses. Good bowel sounds. Extremities: no cyanosis, clubbing, rash, edema Neuro: sedated on TC   Telemetry:  NSR 70s-80s Personally reviewed    Labs: Basic Metabolic Panel: Recent Labs  Lab 05/16/19 0544 05/16/19 0552 05/17/19 0511 05/17/19 0636 05/18/19 0304 05/18/19 0304 05/18/19 1700 05/18/19 1700 05/19/19 0447 05/19/19 0447 05/19/19 1627 05/20/19 0612 05/20/19 1203 05/20/19 1354 05/20/19 1414  NA 140  140   < > 140   < > 138   < > 139   < > 139   < > 139 140 140 140 139  K 3.2*  3.3*   < > 3.1*   < > 2.9*   < > 3.1*   < > 3.0*   < >  3.6 3.3* 3.4* 3.5 3.3*  CL 97*  96*   < > 93*   < > 94*   < > 97*   < > 97*  --  99 102 98 99  --   CO2 29   < > 33*   < > 30  --  27  --  27  --  21* 23  --   --   --   GLUCOSE 157*  178*   < > 118*   < > 121*   < > 156*   < > 184*  --  167* 184* 228* 240*  --   BUN 31*  26*   < > 28*   < > 46*   < > 64*   < > 86*  --  95* 104* 85* 80*  --   CREATININE 2.61*  2.10*   < > 2.34*   < > 3.82*   < > 4.92*   < > 5.63*  --  6.20* 7.14* 5.50* 5.10*  --   CALCIUM 8.7*   < > 9.6   < > 9.1   < > 9.2   < > 9.2  --  9.0 8.9  --   --   --   MG 1.9  --  2.2  --  1.9  --   --   --  2.0  --   --  1.9  --   --   --   PHOS 3.8   < > 4.4   < > 6.0*  --  5.7*  --  6.5*  --  5.3* 6.5*  --   --   --    < > = values in this interval not displayed.    Liver Function Tests: Recent Labs  Lab 05/18/19 0304 05/18/19 1700 05/19/19 0447 05/19/19 1627 05/20/19 0612  ALBUMIN 2.4* 2.4* 2.5* 2.6* 2.3*   No results for input(s): LIPASE, AMYLASE in the last 168 hours. No results for input(s): AMMONIA in the last 168 hours.  CBC: Recent Labs  Lab 05/16/19 1233 05/16/19 1237 05/17/19 0511 05/17/19 0636 05/18/19 0304 05/18/19 0304 05/19/19 0447 05/20/19 0612 05/20/19 1203 05/20/19 1354 05/20/19 1414  WBC 10.0  --  6.8  --  6.2  --  6.0 8.4  --   --   --   HGB 9.0*   < > 8.8*   < > 8.3*   < > 8.5* 7.9* 9.5* 10.5* 9.5*  HCT 29.1*   < > 28.0*   < > 27.4*   < > 28.0* 25.1* 28.0* 31.0* 28.0*  MCV 101.0*  --  100.4*  --  100.7*  --  100.4* 97.7  --   --   --   PLT 294  --  252  --  223  --  216 213  --   --   --    < > = values in this interval not displayed.    Cardiac Enzymes: No results for input(s): CKTOTAL, CKMB, CKMBINDEX, TROPONINI in the last 168 hours.  BNP: BNP (last 3 results) Recent Labs    12/13/18 1750 12/15/18 0405 04/23/19 0945  BNP 1,156.1* 1,161.0* 2,909.9*    ProBNP (last 3 results) No results for input(s): PROBNP in the last 8760 hours.    Other  results:  Imaging: Portable Chest x-ray  Result Date: 05/19/2019 CLINICAL DATA:  34 year old male with shortness of breath. EXAM: PORTABLE CHEST 1 VIEW COMPARISON:  Chest radiograph dated 05/10/2019. FINDINGS: Tracheostomy with tip approximately 5 cm above the carina. A feeding tube extends below the diaphragm with tip beyond the inferior margin of the image. Right sided dialysis catheter with tip in the region of the cavoatrial junction. There is diffuse interstitial and vascular prominence and hazy airspace opacity most consistent with edema. Pneumonia is not excluded. Clinical correlation is recommended. No large pleural effusion. No pneumothorax. There is cardiomegaly. No acute osseous pathology. IMPRESSION: Cardiomegaly with findings of edema or fluid overload. Pneumonia is not excluded. Clinical correlation is recommended. Electronically Signed   By: Anner Crete M.D.   On: 05/19/2019 21:57     Medications:     Scheduled Medications: . sodium chloride   Intravenous Once  . amiodarone  200 mg Per Tube BID  . aspirin  81 mg Per Tube Daily  . atorvastatin  40 mg Per Tube q1800  . chlorhexidine gluconate (MEDLINE KIT)  15 mL Mouth Rinse BID  . Chlorhexidine Gluconate Cloth  6 each Topical Q0600  . feeding supplement (PRO-STAT SUGAR FREE 64)  60 mL Per Tube BID  . FLUoxetine  20 mg Per Tube Daily  . insulin aspart  0-20 Units Subcutaneous Q4H  . insulin aspart  5 Units Subcutaneous Q4H  . insulin detemir  25 Units Subcutaneous BID  . mouth rinse  15 mL Mouth Rinse 10 times per day  .  midodrine  15 mg Per Tube TID WC  . pantoprazole sodium  40 mg Per Tube Q1200  . sodium chloride flush  10-40 mL Intracatheter Q12H  . sodium chloride flush  3 mL Intravenous Q12H    Infusions: . sodium chloride Stopped (05/04/19 0200)  . sodium chloride    . anticoagulant sodium citrate    . bivalirudin (ANGIOMAX) infusion 0.5 mg/mL (Non-ACS indications) 0.07 mg/kg/hr (05/20/19 1500)  . calcium  gluconate infusion for CRRT 20 g (05/20/19 1001)  . feeding supplement (VITAL 1.5 CAL) 1,000 mL (05/20/19 0334)  . ketamine (KETALAR) Adult IV Infusion Stopped (05/19/19 2259)  . prismasol B22GK 4/0 300 mL/hr at 05/20/19 1010  . prismasol B22GK 4/0 2,000 mL/hr at 05/20/19 1248  . sodium chloride    . sodium citrate 2 %/dextrose 2.5% solution 3000 mL 250 mL/hr at 05/20/19 1009    PRN Medications: sodium chloride, sodium chloride, anticoagulant sodium citrate, bisacodyl, docusate, fentaNYL (SUBLIMAZE) injection, loperamide HCl, sodium chloride, sodium chloride flush, sodium chloride flush, zolpidem   Assessment/Plan:   1. Acute on chronic systolic HF due to NICM-> cardiogenic shock - Echo EF < 20% - Off Norepi and milrinone.  - No bb with shock.  - No arb/spiro with ESRD/hypotension - Continue midodrine at 15 tid.  2. AKI -> ESRD - started on CRRT 04/23/19 - making some urine, 500 cc out yesterday - per nephrology, goal is to challenge in iHD early next week - if unable to get him to tolerate iHD of pressors would be a palliative situation 3. Acute CVA with severe residual deficits due to extensive R cerebellar, midbrain, R MCA and L MCA infarcts  - likely cardioembolic - s/p clot extraction - Working with PT/OT  4. Acute hypoxic respiratory failure - s/p trach  - CXR 3/5 w/ worsening pulmonary edema post trach change.  - PEEP increased to 10 - UF goal increased to 100-150 hr 5. Wide complex tachycardia/frequent PVCs/ventricular bigeminy - continues with frequent bigeminy - had recurrent atrial tach/flutter. Ivabradine was stopped.   - Now on amio 200 mg twice a day.  -  Keep K >4.0, Mg >2.0 - No beta blocker with shock 6. Leukocytosis - high risk for sepsis - remains AF - WBC 8.4  - cultures redrawn 2/25 remain NGTD 7. Apical Thrombus No heparin with + SRA. On Bival . ? Transtion to coumadin.   Length of Stay: [address]  PA-C 05/20/2019, 4:21  PM  Advanced Heart Failure Team Pager [phone] (M-F; 7a - 4p)  Please contact [facility] for night-coverage after hours (4p -7a ) and weekends on [email or web address]  Agree with above.   Trach dislodged overnight now replaced. Developed acute pulmonary edema and now back on vent. Sedated. Starting to make urine but not clearing well. On CVVHD pulling 50-100. No CVP line set up   Sedated on vent HEENT ok Neck on trach  Cor RRR +s3 RSC tunneled cath Lungs + crackles Ab obese NT/ND Ext warm no edema Neuro sedated on vent  Major setback overnight with trach dislodgement and development of pulmonary. Now back on vent. Renal function seems to be coming around a bit. Continue volume removal with CVVHD for now. Can consider trial of IV lasix tomorrow.   Off pressors. No line for co-ox.  Rhythm stable on po amio. Can restart IV as needed.   CRITICAL CARE Performed by: Glori Bickers  Total critical care time: 35 minutes  Critical care time was exclusive of  separately billable procedures and treating other patients.  Critical care was necessary to treat or prevent imminent or life-threatening deterioration.  Critical care was time spent personally by me (independent of midlevel providers or residents) on the following activities: development of treatment plan with patient and/or surrogate as well as nursing, discussions with consultants, evaluation of patient's response to treatment, examination of patient, obtaining history from patient or surrogate, ordering and performing treatments and interventions, ordering and review of laboratory studies, ordering and review of radiographic studies, pulse oximetry and re-evaluation of patient's condition.  Glori Bickers, MD  8:32 PM

## 2019-05-21 ENCOUNTER — Inpatient Hospital Stay (HOSPITAL_COMMUNITY): Payer: No Typology Code available for payment source

## 2019-05-21 DIAGNOSIS — N170 Acute kidney failure with tubular necrosis: Secondary | ICD-10-CM | POA: Diagnosis not present

## 2019-05-21 DIAGNOSIS — J9601 Acute respiratory failure with hypoxia: Secondary | ICD-10-CM | POA: Diagnosis not present

## 2019-05-21 DIAGNOSIS — R570 Cardiogenic shock: Secondary | ICD-10-CM | POA: Diagnosis not present

## 2019-05-21 LAB — POCT I-STAT, CHEM 8
BUN: 51 mg/dL — ABNORMAL HIGH (ref 6–20)
BUN: 55 mg/dL — ABNORMAL HIGH (ref 6–20)
BUN: 66 mg/dL — ABNORMAL HIGH (ref 6–20)
BUN: 47 mg/dL — ABNORMAL HIGH (ref 6–20)
BUN: 36 mg/dL — ABNORMAL HIGH (ref 6–20)
BUN: 37 mg/dL — ABNORMAL HIGH (ref 6–20)
BUN: 41 mg/dL — ABNORMAL HIGH (ref 6–20)
BUN: 46 mg/dL — ABNORMAL HIGH (ref 6–20)
BUN: 54 mg/dL — ABNORMAL HIGH (ref 6–20)
BUN: 58 mg/dL — ABNORMAL HIGH (ref 6–20)
BUN: 29 mg/dL — ABNORMAL HIGH (ref 6–20)
BUN: 30 mg/dL — ABNORMAL HIGH (ref 6–20)
BUN: 32 mg/dL — ABNORMAL HIGH (ref 6–20)
BUN: 32 mg/dL — ABNORMAL HIGH (ref 6–20)
BUN: 32 mg/dL — ABNORMAL HIGH (ref 6–20)
BUN: 33 mg/dL — ABNORMAL HIGH (ref 6–20)
BUN: 34 mg/dL — ABNORMAL HIGH (ref 6–20)
BUN: 35 mg/dL — ABNORMAL HIGH (ref 6–20)
BUN: 38 mg/dL — ABNORMAL HIGH (ref 6–20)
BUN: 38 mg/dL — ABNORMAL HIGH (ref 6–20)
BUN: 40 mg/dL — ABNORMAL HIGH (ref 6–20)
BUN: 44 mg/dL — ABNORMAL HIGH (ref 6–20)
BUN: 26 mg/dL — ABNORMAL HIGH (ref 6–20)
BUN: 31 mg/dL — ABNORMAL HIGH (ref 6–20)
Calcium, Ion: 0.61 mmol/L — CL (ref 1.15–1.40)
Calcium, Ion: 0.88 mmol/L — CL (ref 1.15–1.40)
Calcium, Ion: 0.92 mmol/L — ABNORMAL LOW (ref 1.15–1.40)
Calcium, Ion: 0.91 mmol/L — ABNORMAL LOW (ref 1.15–1.40)
Calcium, Ion: 0.46 mmol/L — CL (ref 1.15–1.40)
Calcium, Ion: 0.46 mmol/L — CL (ref 1.15–1.40)
Calcium, Ion: 0.48 mmol/L — CL (ref 1.15–1.40)
Calcium, Ion: 0.49 mmol/L — CL (ref 1.15–1.40)
Calcium, Ion: 0.51 mmol/L — CL (ref 1.15–1.40)
Calcium, Ion: 0.9 mmol/L — ABNORMAL LOW (ref 1.15–1.40)
Calcium, Ion: 0.45 mmol/L — CL (ref 1.15–1.40)
Calcium, Ion: 0.46 mmol/L — CL (ref 1.15–1.40)
Calcium, Ion: 0.46 mmol/L — CL (ref 1.15–1.40)
Calcium, Ion: 0.47 mmol/L — CL (ref 1.15–1.40)
Calcium, Ion: 0.48 mmol/L — CL (ref 1.15–1.40)
Calcium, Ion: 0.64 mmol/L — CL (ref 1.15–1.40)
Calcium, Ion: 0.89 mmol/L — CL (ref 1.15–1.40)
Calcium, Ion: 0.9 mmol/L — ABNORMAL LOW (ref 1.15–1.40)
Calcium, Ion: 0.91 mmol/L — ABNORMAL LOW (ref 1.15–1.40)
Calcium, Ion: 0.93 mmol/L — ABNORMAL LOW (ref 1.15–1.40)
Calcium, Ion: 0.94 mmol/L — ABNORMAL LOW (ref 1.15–1.40)
Calcium, Ion: 0.96 mmol/L — ABNORMAL LOW (ref 1.15–1.40)
Calcium, Ion: 0.48 mmol/L — CL (ref 1.15–1.40)
Calcium, Ion: 0.96 mmol/L — ABNORMAL LOW (ref 1.15–1.40)
Chloride: 96 mmol/L — ABNORMAL LOW (ref 98–111)
Chloride: 96 mmol/L — ABNORMAL LOW (ref 98–111)
Chloride: 97 mmol/L — ABNORMAL LOW (ref 98–111)
Chloride: 96 mmol/L — ABNORMAL LOW (ref 98–111)
Chloride: 92 mmol/L — ABNORMAL LOW (ref 98–111)
Chloride: 93 mmol/L — ABNORMAL LOW (ref 98–111)
Chloride: 93 mmol/L — ABNORMAL LOW (ref 98–111)
Chloride: 94 mmol/L — ABNORMAL LOW (ref 98–111)
Chloride: 96 mmol/L — ABNORMAL LOW (ref 98–111)
Chloride: 98 mmol/L (ref 98–111)
Chloride: 89 mmol/L — ABNORMAL LOW (ref 98–111)
Chloride: 90 mmol/L — ABNORMAL LOW (ref 98–111)
Chloride: 90 mmol/L — ABNORMAL LOW (ref 98–111)
Chloride: 91 mmol/L — ABNORMAL LOW (ref 98–111)
Chloride: 92 mmol/L — ABNORMAL LOW (ref 98–111)
Chloride: 92 mmol/L — ABNORMAL LOW (ref 98–111)
Chloride: 93 mmol/L — ABNORMAL LOW (ref 98–111)
Chloride: 93 mmol/L — ABNORMAL LOW (ref 98–111)
Chloride: 93 mmol/L — ABNORMAL LOW (ref 98–111)
Chloride: 93 mmol/L — ABNORMAL LOW (ref 98–111)
Chloride: 93 mmol/L — ABNORMAL LOW (ref 98–111)
Chloride: 97 mmol/L — ABNORMAL LOW (ref 98–111)
Chloride: 90 mmol/L — ABNORMAL LOW (ref 98–111)
Chloride: 92 mmol/L — ABNORMAL LOW (ref 98–111)
Creatinine, Ser: 3.8 mg/dL — ABNORMAL HIGH (ref 0.61–1.24)
Creatinine, Ser: 4.2 mg/dL — ABNORMAL HIGH (ref 0.61–1.24)
Creatinine, Ser: 4.5 mg/dL — ABNORMAL HIGH (ref 0.61–1.24)
Creatinine, Ser: 4 mg/dL — ABNORMAL HIGH (ref 0.61–1.24)
Creatinine, Ser: 2.9 mg/dL — ABNORMAL HIGH (ref 0.61–1.24)
Creatinine, Ser: 3.1 mg/dL — ABNORMAL HIGH (ref 0.61–1.24)
Creatinine, Ser: 3.3 mg/dL — ABNORMAL HIGH (ref 0.61–1.24)
Creatinine, Ser: 3.6 mg/dL — ABNORMAL HIGH (ref 0.61–1.24)
Creatinine, Ser: 3.7 mg/dL — ABNORMAL HIGH (ref 0.61–1.24)
Creatinine, Ser: 4.4 mg/dL — ABNORMAL HIGH (ref 0.61–1.24)
Creatinine, Ser: 2.5 mg/dL — ABNORMAL HIGH (ref 0.61–1.24)
Creatinine, Ser: 2.5 mg/dL — ABNORMAL HIGH (ref 0.61–1.24)
Creatinine, Ser: 2.6 mg/dL — ABNORMAL HIGH (ref 0.61–1.24)
Creatinine, Ser: 2.6 mg/dL — ABNORMAL HIGH (ref 0.61–1.24)
Creatinine, Ser: 2.7 mg/dL — ABNORMAL HIGH (ref 0.61–1.24)
Creatinine, Ser: 2.8 mg/dL — ABNORMAL HIGH (ref 0.61–1.24)
Creatinine, Ser: 3 mg/dL — ABNORMAL HIGH (ref 0.61–1.24)
Creatinine, Ser: 3 mg/dL — ABNORMAL HIGH (ref 0.61–1.24)
Creatinine, Ser: 3.3 mg/dL — ABNORMAL HIGH (ref 0.61–1.24)
Creatinine, Ser: 3.5 mg/dL — ABNORMAL HIGH (ref 0.61–1.24)
Creatinine, Ser: 3.5 mg/dL — ABNORMAL HIGH (ref 0.61–1.24)
Creatinine, Ser: 3.7 mg/dL — ABNORMAL HIGH (ref 0.61–1.24)
Creatinine, Ser: 2.4 mg/dL — ABNORMAL HIGH (ref 0.61–1.24)
Creatinine, Ser: 2.8 mg/dL — ABNORMAL HIGH (ref 0.61–1.24)
Glucose, Bld: 251 mg/dL — ABNORMAL HIGH (ref 70–99)
Glucose, Bld: 291 mg/dL — ABNORMAL HIGH (ref 70–99)
Glucose, Bld: 294 mg/dL — ABNORMAL HIGH (ref 70–99)
Glucose, Bld: 289 mg/dL — ABNORMAL HIGH (ref 70–99)
Glucose, Bld: 262 mg/dL — ABNORMAL HIGH (ref 70–99)
Glucose, Bld: 274 mg/dL — ABNORMAL HIGH (ref 70–99)
Glucose, Bld: 288 mg/dL — ABNORMAL HIGH (ref 70–99)
Glucose, Bld: 299 mg/dL — ABNORMAL HIGH (ref 70–99)
Glucose, Bld: 303 mg/dL — ABNORMAL HIGH (ref 70–99)
Glucose, Bld: 311 mg/dL — ABNORMAL HIGH (ref 70–99)
Glucose, Bld: 198 mg/dL — ABNORMAL HIGH (ref 70–99)
Glucose, Bld: 227 mg/dL — ABNORMAL HIGH (ref 70–99)
Glucose, Bld: 227 mg/dL — ABNORMAL HIGH (ref 70–99)
Glucose, Bld: 235 mg/dL — ABNORMAL HIGH (ref 70–99)
Glucose, Bld: 248 mg/dL — ABNORMAL HIGH (ref 70–99)
Glucose, Bld: 254 mg/dL — ABNORMAL HIGH (ref 70–99)
Glucose, Bld: 259 mg/dL — ABNORMAL HIGH (ref 70–99)
Glucose, Bld: 269 mg/dL — ABNORMAL HIGH (ref 70–99)
Glucose, Bld: 271 mg/dL — ABNORMAL HIGH (ref 70–99)
Glucose, Bld: 273 mg/dL — ABNORMAL HIGH (ref 70–99)
Glucose, Bld: 277 mg/dL — ABNORMAL HIGH (ref 70–99)
Glucose, Bld: 302 mg/dL — ABNORMAL HIGH (ref 70–99)
Glucose, Bld: 194 mg/dL — ABNORMAL HIGH (ref 70–99)
Glucose, Bld: 228 mg/dL — ABNORMAL HIGH (ref 70–99)
HCT: 29 % — ABNORMAL LOW (ref 39.0–52.0)
HCT: 29 % — ABNORMAL LOW (ref 39.0–52.0)
HCT: 32 % — ABNORMAL LOW (ref 39.0–52.0)
HCT: 28 % — ABNORMAL LOW (ref 39.0–52.0)
HCT: 28 % — ABNORMAL LOW (ref 39.0–52.0)
HCT: 29 % — ABNORMAL LOW (ref 39.0–52.0)
HCT: 31 % — ABNORMAL LOW (ref 39.0–52.0)
HCT: 31 % — ABNORMAL LOW (ref 39.0–52.0)
HCT: 31 % — ABNORMAL LOW (ref 39.0–52.0)
HCT: 32 % — ABNORMAL LOW (ref 39.0–52.0)
HCT: 25 % — ABNORMAL LOW (ref 39.0–52.0)
HCT: 25 % — ABNORMAL LOW (ref 39.0–52.0)
HCT: 26 % — ABNORMAL LOW (ref 39.0–52.0)
HCT: 26 % — ABNORMAL LOW (ref 39.0–52.0)
HCT: 27 % — ABNORMAL LOW (ref 39.0–52.0)
HCT: 28 % — ABNORMAL LOW (ref 39.0–52.0)
HCT: 28 % — ABNORMAL LOW (ref 39.0–52.0)
HCT: 29 % — ABNORMAL LOW (ref 39.0–52.0)
HCT: 29 % — ABNORMAL LOW (ref 39.0–52.0)
HCT: 29 % — ABNORMAL LOW (ref 39.0–52.0)
HCT: 31 % — ABNORMAL LOW (ref 39.0–52.0)
HCT: 35 % — ABNORMAL LOW (ref 39.0–52.0)
HCT: 24 % — ABNORMAL LOW (ref 39.0–52.0)
HCT: 26 % — ABNORMAL LOW (ref 39.0–52.0)
Hemoglobin: 10.9 g/dL — ABNORMAL LOW (ref 13.0–17.0)
Hemoglobin: 9.9 g/dL — ABNORMAL LOW (ref 13.0–17.0)
Hemoglobin: 9.9 g/dL — ABNORMAL LOW (ref 13.0–17.0)
Hemoglobin: 9.5 g/dL — ABNORMAL LOW (ref 13.0–17.0)
Hemoglobin: 10.5 g/dL — ABNORMAL LOW (ref 13.0–17.0)
Hemoglobin: 10.5 g/dL — ABNORMAL LOW (ref 13.0–17.0)
Hemoglobin: 10.5 g/dL — ABNORMAL LOW (ref 13.0–17.0)
Hemoglobin: 10.9 g/dL — ABNORMAL LOW (ref 13.0–17.0)
Hemoglobin: 9.5 g/dL — ABNORMAL LOW (ref 13.0–17.0)
Hemoglobin: 9.9 g/dL — ABNORMAL LOW (ref 13.0–17.0)
Hemoglobin: 10.5 g/dL — ABNORMAL LOW (ref 13.0–17.0)
Hemoglobin: 11.9 g/dL — ABNORMAL LOW (ref 13.0–17.0)
Hemoglobin: 8.5 g/dL — ABNORMAL LOW (ref 13.0–17.0)
Hemoglobin: 8.5 g/dL — ABNORMAL LOW (ref 13.0–17.0)
Hemoglobin: 8.8 g/dL — ABNORMAL LOW (ref 13.0–17.0)
Hemoglobin: 8.8 g/dL — ABNORMAL LOW (ref 13.0–17.0)
Hemoglobin: 9.2 g/dL — ABNORMAL LOW (ref 13.0–17.0)
Hemoglobin: 9.5 g/dL — ABNORMAL LOW (ref 13.0–17.0)
Hemoglobin: 9.5 g/dL — ABNORMAL LOW (ref 13.0–17.0)
Hemoglobin: 9.9 g/dL — ABNORMAL LOW (ref 13.0–17.0)
Hemoglobin: 9.9 g/dL — ABNORMAL LOW (ref 13.0–17.0)
Hemoglobin: 9.9 g/dL — ABNORMAL LOW (ref 13.0–17.0)
Hemoglobin: 8.2 g/dL — ABNORMAL LOW (ref 13.0–17.0)
Hemoglobin: 8.8 g/dL — ABNORMAL LOW (ref 13.0–17.0)
Potassium: 4.1 mmol/L (ref 3.5–5.1)
Potassium: 4.8 mmol/L (ref 3.5–5.1)
Potassium: 5.1 mmol/L (ref 3.5–5.1)
Potassium: 4.6 mmol/L (ref 3.5–5.1)
Potassium: 4.1 mmol/L (ref 3.5–5.1)
Potassium: 4.3 mmol/L (ref 3.5–5.1)
Potassium: 4.3 mmol/L (ref 3.5–5.1)
Potassium: 4.4 mmol/L (ref 3.5–5.1)
Potassium: 4.7 mmol/L (ref 3.5–5.1)
Potassium: 4.7 mmol/L (ref 3.5–5.1)
Potassium: 3.7 mmol/L (ref 3.5–5.1)
Potassium: 3.8 mmol/L (ref 3.5–5.1)
Potassium: 3.8 mmol/L (ref 3.5–5.1)
Potassium: 3.9 mmol/L (ref 3.5–5.1)
Potassium: 3.9 mmol/L (ref 3.5–5.1)
Potassium: 4 mmol/L (ref 3.5–5.1)
Potassium: 4 mmol/L (ref 3.5–5.1)
Potassium: 4.2 mmol/L (ref 3.5–5.1)
Potassium: 4.3 mmol/L (ref 3.5–5.1)
Potassium: 4.3 mmol/L (ref 3.5–5.1)
Potassium: 4.3 mmol/L (ref 3.5–5.1)
Potassium: 4.4 mmol/L (ref 3.5–5.1)
Potassium: 3.4 mmol/L — ABNORMAL LOW (ref 3.5–5.1)
Potassium: 3.5 mmol/L (ref 3.5–5.1)
Sodium: 137 mmol/L (ref 135–145)
Sodium: 138 mmol/L (ref 135–145)
Sodium: 138 mmol/L (ref 135–145)
Sodium: 137 mmol/L (ref 135–145)
Sodium: 137 mmol/L (ref 135–145)
Sodium: 138 mmol/L (ref 135–145)
Sodium: 138 mmol/L (ref 135–145)
Sodium: 138 mmol/L (ref 135–145)
Sodium: 138 mmol/L (ref 135–145)
Sodium: 139 mmol/L (ref 135–145)
Sodium: 137 mmol/L (ref 135–145)
Sodium: 137 mmol/L (ref 135–145)
Sodium: 137 mmol/L (ref 135–145)
Sodium: 138 mmol/L (ref 135–145)
Sodium: 138 mmol/L (ref 135–145)
Sodium: 138 mmol/L (ref 135–145)
Sodium: 138 mmol/L (ref 135–145)
Sodium: 138 mmol/L (ref 135–145)
Sodium: 138 mmol/L (ref 135–145)
Sodium: 139 mmol/L (ref 135–145)
Sodium: 139 mmol/L (ref 135–145)
Sodium: 140 mmol/L (ref 135–145)
Sodium: 137 mmol/L (ref 135–145)
Sodium: 139 mmol/L (ref 135–145)
TCO2: 28 mmol/L (ref 22–32)
TCO2: 29 mmol/L (ref 22–32)
TCO2: 30 mmol/L (ref 22–32)
TCO2: 32 mmol/L (ref 22–32)
TCO2: 28 mmol/L (ref 22–32)
TCO2: 29 mmol/L (ref 22–32)
TCO2: 30 mmol/L (ref 22–32)
TCO2: 30 mmol/L (ref 22–32)
TCO2: 31 mmol/L (ref 22–32)
TCO2: 33 mmol/L — ABNORMAL HIGH (ref 22–32)
TCO2: 31 mmol/L (ref 22–32)
TCO2: 31 mmol/L (ref 22–32)
TCO2: 31 mmol/L (ref 22–32)
TCO2: 31 mmol/L (ref 22–32)
TCO2: 32 mmol/L (ref 22–32)
TCO2: 33 mmol/L — ABNORMAL HIGH (ref 22–32)
TCO2: 33 mmol/L — ABNORMAL HIGH (ref 22–32)
TCO2: 33 mmol/L — ABNORMAL HIGH (ref 22–32)
TCO2: 35 mmol/L — ABNORMAL HIGH (ref 22–32)
TCO2: 35 mmol/L — ABNORMAL HIGH (ref 22–32)
TCO2: 37 mmol/L — ABNORMAL HIGH (ref 22–32)
TCO2: 38 mmol/L — ABNORMAL HIGH (ref 22–32)
TCO2: 30 mmol/L (ref 22–32)
TCO2: 35 mmol/L — ABNORMAL HIGH (ref 22–32)

## 2019-05-21 LAB — POCT I-STAT 7, (LYTES, BLD GAS, ICA,H+H)
Acid-Base Excess: 5 mmol/L — ABNORMAL HIGH (ref 0.0–2.0)
Bicarbonate: 31.5 mmol/L — ABNORMAL HIGH (ref 20.0–28.0)
Calcium, Ion: 0.93 mmol/L — ABNORMAL LOW (ref 1.15–1.40)
HCT: 28 % — ABNORMAL LOW (ref 39.0–52.0)
Hemoglobin: 9.5 g/dL — ABNORMAL LOW (ref 13.0–17.0)
O2 Saturation: 99 %
Patient temperature: 98.9
Potassium: 4.5 mmol/L (ref 3.5–5.1)
Sodium: 137 mmol/L (ref 135–145)
TCO2: 33 mmol/L — ABNORMAL HIGH (ref 22–32)
pCO2 arterial: 59.2 mmHg — ABNORMAL HIGH (ref 32.0–48.0)
pH, Arterial: 7.335 — ABNORMAL LOW (ref 7.350–7.450)
pO2, Arterial: 165 mmHg — ABNORMAL HIGH (ref 83.0–108.0)

## 2019-05-21 LAB — GLUCOSE, CAPILLARY
Glucose-Capillary: 239 mg/dL — ABNORMAL HIGH (ref 70–99)
Glucose-Capillary: 262 mg/dL — ABNORMAL HIGH (ref 70–99)
Glucose-Capillary: 212 mg/dL — ABNORMAL HIGH (ref 70–99)
Glucose-Capillary: 237 mg/dL — ABNORMAL HIGH (ref 70–99)
Glucose-Capillary: 176 mg/dL — ABNORMAL HIGH (ref 70–99)
Glucose-Capillary: 191 mg/dL — ABNORMAL HIGH (ref 70–99)

## 2019-05-21 LAB — CBC
HCT: 28 % — ABNORMAL LOW (ref 39.0–52.0)
Hemoglobin: 8.2 g/dL — ABNORMAL LOW (ref 13.0–17.0)
MCH: 29.7 pg (ref 26.0–34.0)
MCHC: 29.3 g/dL — ABNORMAL LOW (ref 30.0–36.0)
MCV: 101.4 fL — ABNORMAL HIGH (ref 80.0–100.0)
Platelets: 241 10*3/uL (ref 150–400)
RBC: 2.76 MIL/uL — ABNORMAL LOW (ref 4.22–5.81)
RDW: 14.7 % (ref 11.5–15.5)
WBC: 11 10*3/uL — ABNORMAL HIGH (ref 4.0–10.5)
nRBC: 0 % (ref 0.0–0.2)

## 2019-05-21 LAB — RENAL FUNCTION PANEL
Albumin: 2.4 g/dL — ABNORMAL LOW (ref 3.5–5.0)
Anion gap: 14 (ref 5–15)
BUN: 45 mg/dL — ABNORMAL HIGH (ref 6–20)
CO2: 30 mmol/L (ref 22–32)
Calcium: 8.2 mg/dL — ABNORMAL LOW (ref 8.9–10.3)
Chloride: 94 mmol/L — ABNORMAL LOW (ref 98–111)
Creatinine, Ser: 3.89 mg/dL — ABNORMAL HIGH (ref 0.61–1.24)
GFR calc Af Amer: 22 mL/min — ABNORMAL LOW (ref >60)
GFR calc non Af Amer: 19 mL/min — ABNORMAL LOW (ref >60)
Glucose, Bld: 295 mg/dL — ABNORMAL HIGH (ref 70–99)
Phosphorus: 3.8 mg/dL (ref 2.5–4.6)
Potassium: 4.3 mmol/L (ref 3.5–5.1)
Sodium: 138 mmol/L (ref 135–145)

## 2019-05-21 LAB — MAGNESIUM: Magnesium: 1.9 mg/dL (ref 1.7–2.4)

## 2019-05-21 LAB — APTT: aPTT: 59 seconds — ABNORMAL HIGH (ref 24–36)

## 2019-05-21 LAB — CALCIUM, IONIZED: Calcium, Ionized, Serum: 4.8 mg/dL (ref 4.5–5.6)

## 2019-05-21 MED ORDER — PHENYLEPHRINE CONCENTRATED 100MG/250ML (0.4 MG/ML) INFUSION SIMPLE: 0.0000 ug/min | INTRAVENOUS | Status: DC

## 2019-05-21 MED ORDER — PHENYLEPHRINE HCL-NACL 10-0.9 MG/250ML-% IV SOLN
25.0000 ug/min | INTRAVENOUS | Status: DC
  Administered 2019-05-21: 25 ug/min via INTRAVENOUS
  Administered 2019-05-21 (×3): 55 ug/min via INTRAVENOUS
  Administered 2019-05-21: 35 ug/min via INTRAVENOUS
  Administered 2019-05-22: 55 ug/min via INTRAVENOUS
  Administered 2019-05-22: 35 ug/min via INTRAVENOUS
  Administered 2019-05-22: 55 ug/min via INTRAVENOUS
  Filled 2019-05-21 (×8): qty 250

## 2019-05-21 MED ORDER — SODIUM CHLORIDE 0.9 % IV SOLN
250.0000 mL | INTRAVENOUS | Status: DC
  Administered 2019-06-18: 250 mL via INTRAVENOUS

## 2019-05-21 NOTE — Progress Notes (Signed)
ANTICOAGULATION CONSULT NOTE  Pharmacy Consult:  Bivalirudin Indication: stroke 2/5, LV thrombus 2/8, HIT 2/15   Allergies  Allergen Reactions  . Heparin Other (See Comments)    Heparin induced thrombocytopenia. 2/15 HIT OD 1.692. 2/16 SRA positive-90.     Patient Measurements: Height: 6\' 1"  (185.4 cm) Weight: 245 lb 13 oz (111.5 kg) IBW/kg (Calculated) : 79.9   Vital Signs: Temp: 98.9 F (37.2 C) (03/06 0400) Temp Source: Axillary (03/06 0400) BP: 89/63 (03/06 1030) Pulse Rate: 94 (03/06 0900)  Labs: Recent Labs    05/19/19 0447 05/19/19 0447 05/19/19 1348 05/19/19 1844 05/20/19 0612 05/20/19 1203 05/21/19 0500 05/21/19 0500 05/21/19 0621 05/21/19 0816  HGB 8.5*   < >  --   --  7.9*   < > 8.2*   < > 9.9* 10.5*  HCT 28.0*   < >  --   --  25.1*   < > 28.0*  --  29.0* 31.0*  PLT 216  --   --   --  213  --  241  --   --   --   APTT  --   --    < > 55* 57*  --  59*  --   --   --   CREATININE 5.63*   < >   < >  --  7.14*   < > 3.89*  --  3.10* 2.90*   < > = values in this interval not displayed.    Estimated Creatinine Clearance: 47 mL/min (A) (by C-G formula based on SCr of 2.9 mg/dL (H)).  Assessment: 34 yr old male with left MCA infarct - received TPA and revascularization with IR on 2/5. On 2/8 found to have small LV apical thrombus on ECHO. Pharmacy consulted to dose IV heparin, which was switched to bivalirudin 2/15 for HIT. 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 SRA very positive at 23. Heparin allergy has appropriately been added to chart and updated with SRA results.   APTT is therapeutic this morning at 59; no bleeding reported. Hemoglobin stable 8.2. Platelet count WNL.   Goal of Therapy:  APTT 50-65 sec  Monitor platelets by anticoagulation protocol: Yes   Plan:  - Continue bivalirudin infusion at 0.07 mg/kg/hr - Daily aPTT and CBC - Monitor for s/sx of bleeding - F/u transition to warfarin  Alanda Slim, PharmD,  [facility] Clinical Pharmacist Please see AMION for all Pharmacists' Contact Phone Numbers 05/21/2019, 11:05 AM

## 2019-05-21 NOTE — Progress Notes (Signed)
eLink Physician-Brief Progress Note Patient Name: Carlos Brewer. DOB: 06-Jun-1985 MRN: [id number]   Date of Service  05/21/2019  HPI/Events of Note  O2 sat now 100% on pressure AC 28/25 (TV 400s)/ 100%/ 10 PEEP  eICU Interventions  Repeat ABG this am and titrate down FiO2 if appropriate     Intervention Category Major Interventions: Respiratory failure - evaluation and management  Judd Lien 05/21/2019, 4:19 AM

## 2019-05-21 NOTE — Plan of Care (Signed)
  Problem: Education: Goal: Knowledge of General Education information will improve Description: Including pain rating scale, medication(s)/side effects and non-pharmacologic comfort measures Outcome: Not Progressing   

## 2019-05-21 NOTE — Progress Notes (Signed)
[facility] Progress Note     Assessment/ Plan:   1. AKIpresumably due to ATN with ischemic/nephrotoxic insults worsened by cardiogenic shock. Started on CRRT 04/23/19. Resumed CVVHD2/19/21 due to hyperkalemiawhich has resolved. After trial of CRRT without citrate with clotting x multiple in short time, CRRT was initially held overnight. Then resumed on 2/25-3/2with citrate for clearance.  1. He's actually making urine but not getting clearance. 2. Failed  fluid challenge -> plan on challenging with iHD Friday but unfortunately due to staffing issues we will have to use CRRT. 3. Goal is to challenge in iHD early next week. 4. Has had clotting issues when off citrate even on angiomax. 5. BP very soft and UF had to be turned down; will give 1-2hr net even then can try again at 50ml/hr. 6. If he clots off will hold CRRT or when due for next filter change (tues) 2. Cardiogenic shock- Hx milrinone due to development of a fib with rvr and wide complex tachycardia. -Known severe NICM with EF <20% andmay not tolerate but also can't continue CRRT indefinitely. - If he doesn't tolerate iHD will need meeting with mother to define goals of care.  3. Leukocytosis - improving last check. abx per primary team.  Blood cultures sent 2/25 - listed as NGTD though low volume. 4. Acute stroke- presumed embolic with bilateral infarcts and LV thrombus- received tpa on admission 5. SVT/polymorphic VT- per Cardiology 6. Acute hypoxic respiratory failure- s/p trach  7. 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 HIT Ab+on Argatroban. 8. Disposition- overall prognosis is poor with ongoing RRT (has not tolerated stopping CVVHD due to hypercatabolic state and concern for poor tolerance of intermittent HD), trach, as well as cardiogenic shock/cardiomyopathy.  Subjective:   BP still very soft On neo.   Objective:   BP (!) 89/63   Pulse 94   Temp 98.9 F (37.2 C) (Axillary)   Resp  (!) 28   Ht 6' 1" (1.854 m)   Wt 111.5 kg   SpO2 100%   BMI 32.43 kg/m   Intake/Output Summary (Last 24 hours) at 05/21/2019 1115 Last data filed at 05/21/2019 1100 Gross per 24 hour  Intake 4420.26 ml  Output 4917 ml  Net -496.74 ml   Weight change:   Physical Exam: Gen: awake in bed, trach collar, nonverbal. HEENT - trach in place CVS: S1S2 no rub Resp:reduced breath sounds Abd: soft/ND Ext: he has no edema Access: rightIJtunneled catheter  Imaging: DG Chest Port 1 View  Result Date: 05/21/2019 CLINICAL DATA:  Ventilator dependent respiratory failure. Follow-up pneumonia. EXAM: PORTABLE CHEST 1 VIEW COMPARISON:  05/20/2019 and earlier. FINDINGS: Tracheostomy tube tip in satisfactory position approximately 4 cm above the carina. RIGHT jugular dual lumen central venous catheter tip projects over the LOWER SVC, unchanged. Feeding tube courses below the diaphragm into the stomach though its tip is not included on the image. Cardiac silhouette moderately enlarged, unchanged. Diffuse airspace opacities throughout both lungs, most confluent in the LEFT LOWER LOBE, improved since yesterday, though moderate airspace opacities persist. No new pulmonary parenchymal abnormalities. IMPRESSION: 1. Support apparatus satisfactory. 2. Improved BILATERAL pneumonia, most confluent in the LEFT LOWER LOBE, though moderate airspace opacities persist. 3. No new abnormalities. Electronically Signed   By: Thomas  Lawrence M.D.   On: 05/21/2019 09:39   DG CHEST PORT 1 VIEW  Result Date: 05/20/2019 CLINICAL DATA:  33-year-old male with shortness of breath. EXAM: PORTABLE CHEST 1 VIEW COMPARISON:  Chest radiograph dated 05/20/2019. FINDINGS: Tracheostomy   with tip approximately 4 cm above the carina. Feeding tube extends below the diaphragm with tip beyond the inferior margin of the image. Right IJ dialysis catheter in similar position. Diffuse bilateral airspace opacities progressed since the earlier  radiograph. No pneumothorax. Stable cardiomediastinal silhouette. No acute osseous pathology. IMPRESSION: Interval progression of the diffuse bilateral airspace opacities. Electronically Signed   By: Arash  Radparvar M.D.   On: 05/20/2019 18:51   DG CHEST PORT 1 VIEW  Result Date: 05/20/2019 CLINICAL DATA:  33-year-old male with history of acute respiratory failure with hypoxemia. EXAM: PORTABLE CHEST 1 VIEW COMPARISON:  Chest x-ray 05/20/2019. FINDINGS: A tracheostomy tube is in place with tip 5.0 cm above the carina. Right internal jugular PermCath with tip terminating in the distal superior vena cava. A feeding tube is seen extending into the abdomen, however, the tip of the feeding tube extends below the lower margin of the image. Patchy multifocal airspace consolidation again noted throughout the lungs bilaterally (left greater than right), without significant improvement in aeration. No pleural effusions. Pulmonary vasculature is obscured. Heart size is mildly enlarged. Upper mediastinal contours are obscured. IMPRESSION: 1. Support apparatus, as above. 2. Severe multilobar pneumonia redemonstrated, as above. No significant change. 3. Cardiomegaly. Electronically Signed   By: Daniel  Entrikin M.D.   On: 05/20/2019 14:52   DG CHEST PORT 1 VIEW  Result Date: 05/20/2019 CLINICAL DATA:  Respiratory distress. Shortness of breath. History of heart failure, myocarditis. EXAM: PORTABLE CHEST 1 VIEW COMPARISON:  Radiographs 05/19/2019 and 05/10/2019. CT 12/13/2018. FINDINGS: 1136 hours. The tracheostomy, feeding tube and right IJ dialysis catheter appear unchanged. There are worsening bilateral airspace opacities with confluent components, especially in the right upper lobe. No pneumothorax or significant pleural effusion. The heart is partly obscured by these airspace opacities. No significant osseous abnormalities. IMPRESSION: Significant worsening of bilateral airspace opacities since yesterday, consistent  with edema, hemorrhage or infection. No pneumothorax or significant pleural effusion. Stable support system. Electronically Signed   By: William  Veazey M.D.   On: 05/20/2019 11:52   Portable Chest x-ray  Result Date: 05/19/2019 CLINICAL DATA:  33-year-old male with shortness of breath. EXAM: PORTABLE CHEST 1 VIEW COMPARISON:  Chest radiograph dated 05/10/2019. FINDINGS: Tracheostomy with tip approximately 5 cm above the carina. A feeding tube extends below the diaphragm with tip beyond the inferior margin of the image. Right sided dialysis catheter with tip in the region of the cavoatrial junction. There is diffuse interstitial and vascular prominence and hazy airspace opacity most consistent with edema. Pneumonia is not excluded. Clinical correlation is recommended. No large pleural effusion. No pneumothorax. There is cardiomegaly. No acute osseous pathology. IMPRESSION: Cardiomegaly with findings of edema or fluid overload. Pneumonia is not excluded. Clinical correlation is recommended. Electronically Signed   By: Arash  Radparvar M.D.   On: 05/19/2019 21:57    Labs: BMET Recent Labs  Lab 05/18/19 0304 05/18/19 0304 05/18/19 1700 05/18/19 1700 05/19/19 0447 05/19/19 0447 05/19/19 1627 05/19/19 1627 05/20/19 0612 05/20/19 1203 05/20/19 1627 05/20/19 1718 05/20/19 2114 05/20/19 2114 05/20/19 2312 05/21/19 0059 05/21/19 0340 05/21/19 0427 05/21/19 0500 05/21/19 0621 05/21/19 0816  NA 138   < > 139   < > 139   < > 139   < > 140   < > 138   < > 138   < > 137  138 138  137 138  137 137 138 138 138  K 2.9*   < > 3.1*   < > 3.0*   < >   3.6   < > 3.3*   < > 3.8   < > 4.1   < > 4.7  4.8 4.7  5.1 4.4  4.6 4.5 4.3 4.3 4.3  CL 94*   < > 97*   < > 97*   < > 99   < > 102   < > 100   < > 97*  --  98  96* 94*  96* 93*  96*  --  94* 93* 92*  CO2 30  --  27  --  27  --  21*  --  23  --  24  --   --   --   --   --   --   --  30  --   --   GLUCOSE 121*   < > 156*   < > 184*   < > 167*   < >  184*   < > 240*   < > 251*  --  274*  291* 303*  294* 299*  289*  --  295* 311* 288*  BUN 46*   < > 64*   < > 86*   < > 95*   < > 104*   < > 77*   < > 66*  --  58*  51* 46*  55* 41*  47*  --  45* 37* 36*  CREATININE 3.82*   < > 4.92*   < > 5.63*   < > 6.20*   < > 7.14*   < > 5.40*   < > 4.50*  --  4.40*  3.80* 3.60*  4.20* 3.30*  4.00*  --  3.89* 3.10* 2.90*  CALCIUM 9.1  --  9.2  --  9.2  --  9.0  --  8.9  --  8.0*  --   --   --   --   --   --   --  8.2*  --   --   PHOS 6.0*  --  5.7*  --  6.5*  --  5.3*  --  6.5*  --  4.0  --   --   --   --   --   --   --  3.8  --   --    < > = values in this interval not displayed.   CBC Recent Labs  Lab 05/18/19 0304 05/18/19 0304 05/19/19 0447 05/19/19 0447 05/20/19 0612 05/20/19 1203 05/21/19 0427 05/21/19 0500 05/21/19 0621 05/21/19 0816  WBC 6.2  --  6.0  --  8.4  --   --  11.0*  --   --   HGB 8.3*   < > 8.5*   < > 7.9*   < > 9.5* 8.2* 9.9* 10.5*  HCT 27.4*   < > 28.0*   < > 25.1*   < > 28.0* 28.0* 29.0* 31.0*  MCV 100.7*  --  100.4*  --  97.7  --   --  101.4*  --   --   PLT 223  --  216  --  213  --   --  241  --   --    < > = values in this interval not displayed.    Medications:    . sodium chloride   Intravenous Once  . amiodarone  200 mg Per Tube BID  . aspirin  81 mg Per Tube Daily  . atorvastatin  40 mg Per Tube q1800  . chlorhexidine gluconate (MEDLINE   KIT)  15 mL Mouth Rinse BID  . Chlorhexidine Gluconate Cloth  6 each Topical Q0600  . feeding supplement (PRO-STAT SUGAR FREE 64)  60 mL Per Tube BID  . FLUoxetine  20 mg Per Tube Daily  . insulin aspart  0-20 Units Subcutaneous Q4H  . insulin aspart  5 Units Subcutaneous Q4H  . insulin detemir  25 Units Subcutaneous BID  . mouth rinse  15 mL Mouth Rinse 10 times per day  . midodrine  15 mg Per Tube TID WC  . pantoprazole sodium  40 mg Per Tube Q1200  . sodium chloride flush  10-40 mL Intracatheter Q12H  . sodium chloride flush  3 mL Intravenous Q12H  . vecuronium   10 mg Intravenous Once      Otelia Santee, MD 05/21/2019, 11:15 AM

## 2019-05-21 NOTE — Progress Notes (Signed)
Advanced Heart Failure Rounding Note   Subjective:    Trach changed on 3/5 >>developed increased WOB. F/U CXR w/ bilateral pulmonary edema.   He remains on the ventilator at 80% via trach.  He is sedated but will arouse.  He does not follow commands for me at this time.  He is on CVVHD.  Blood pressure was dropping of fluid removal.  Now keeping even.  Weight is back to near baseline.  Remains on neo at 35.  Rhythm stable on p.o. amnio.  Objective:   Weight Range:  Vital Signs:   Temp:  [97.8 F (36.6 C)-100 F (37.8 C)] 98.9 F (37.2 C) (03/06 0400) Pulse Rate:  [31-116] 88 (03/06 1208) Resp:  [17-36] 28 (03/06 1208) BP: (79-121)/(42-90) 83/61 (03/06 1230) SpO2:  [83 %-100 %] 100 % (03/06 1208) Arterial Line BP: (98-132)/(53-65) 101/53 (03/06 1230) FiO2 (%):  [60 %-100 %] 60 % (03/06 1208) Last BM Date: 05/20/19  Weight change: Filed Weights   05/18/19 0500 05/19/19 0500 05/20/19 1000  Weight: 110.1 kg 108.1 kg 111.5 kg    Intake/Output:   Intake/Output Summary (Last 24 hours) at 05/21/2019 1318 Last data filed at 05/21/2019 1300 Gross per 24 hour  Intake 4688.13 ml  Output 5291 ml  Net -602.87 ml    PHYSICAL EXAM: General: Sedated on vent via trach.  Will arouse but would not follow commands. HEENT: normal Neck: supple. no JVD.  Trach collar  carotids 2+ bilat; no bruits. No lymphadenopathy or thryomegaly appreciated. Cor: PMI nondisplaced. Regular rate & rhythm. No rubs, gallops or murmurs.  Tunneled dialysis catheter Lungs: clear Abdomen: Obese  soft, nontender, nondistended. No hepatosplenomegaly. No bruits or masses. Good bowel sounds. Extremities: no cyanosis, clubbing, rash, edema Neuro: Sedated on vent will awaken but not follow commands.   Telemetry: Normal sinus rhythm 70 to 80s personally reviewed    Labs: Basic Metabolic Panel: Recent Labs  Lab 05/17/19 0511 05/17/19 0636 05/18/19 0304 05/18/19 1700 05/19/19 0447 05/19/19 0447  05/19/19 1627 05/19/19 1627 05/20/19 0612 05/20/19 1203 05/20/19 1627 05/20/19 1718 05/21/19 0059 05/21/19 0059 05/21/19 0340 05/21/19 0427 05/21/19 0500 05/21/19 0621 05/21/19 0816  NA 140   < > 138   < > 139   < > 139   < > 140   < > 138   < > 138  137   < > 138  137 137 138 138 138  K 3.1*   < > 2.9*   < > 3.0*   < > 3.6   < > 3.3*   < > 3.8   < > 4.7  5.1   < > 4.4  4.6 4.5 4.3 4.3 4.3  CL 93*   < > 94*   < > 97*   < > 99   < > 102   < > 100   < > 94*  96*  --  93*  96*  --  94* 93* 92*  CO2 33*   < > 30   < > 27  --  21*  --  23  --  24  --   --   --   --   --  30  --   --   GLUCOSE 118*   < > 121*   < > 184*   < > 167*   < > 184*   < > 240*   < > 303*  294*  --  299*  289*  --  295* 311* 288*  BUN 28*   < > 46*   < > 86*   < > 95*   < > 104*   < > 77*   < > 46*  55*  --  41*  47*  --  45* 37* 36*  CREATININE 2.34*   < > 3.82*   < > 5.63*   < > 6.20*   < > 7.14*   < > 5.40*   < > 3.60*  4.20*  --  3.30*  4.00*  --  3.89* 3.10* 2.90*  CALCIUM 9.6   < > 9.1   < > 9.2   < > 9.0   < > 8.9  --  8.0*  --   --   --   --   --  8.2*  --   --   MG 2.2  --  1.9  --  2.0  --   --   --  1.9  --   --   --   --   --   --   --  1.9  --   --   PHOS 4.4   < > 6.0*   < > 6.5*  --  5.3*  --  6.5*  --  4.0  --   --   --   --   --  3.8  --   --    < > = values in this interval not displayed.    Liver Function Tests: Recent Labs  Lab 05/19/19 0447 05/19/19 1627 05/20/19 0612 05/20/19 1627 05/21/19 0500  ALBUMIN 2.5* 2.6* 2.3* 2.4* 2.4*   No results for input(s): LIPASE, AMYLASE in the last 168 hours. No results for input(s): AMMONIA in the last 168 hours.  CBC: Recent Labs  Lab 05/17/19 0511 05/17/19 0636 05/18/19 0304 05/18/19 0304 05/19/19 0447 05/19/19 0447 05/20/19 0612 05/20/19 1203 05/21/19 0340 05/21/19 0427 05/21/19 0500 05/21/19 0621 05/21/19 0816  WBC 6.8  --  6.2  --  6.0  --  8.4  --   --   --  11.0*  --   --   HGB 8.8*   < > 8.3*   < > 8.5*   < > 7.9*    < > 10.9*  9.5* 9.5* 8.2* 9.9* 10.5*  HCT 28.0*   < > 27.4*   < > 28.0*   < > 25.1*   < > 32.0*  28.0* 28.0* 28.0* 29.0* 31.0*  MCV 100.4*  --  100.7*  --  100.4*  --  97.7  --   --   --  101.4*  --   --   PLT 252  --  223  --  216  --  213  --   --   --  241  --   --    < > = values in this interval not displayed.    Cardiac Enzymes: No results for input(s): CKTOTAL, CKMB, CKMBINDEX, TROPONINI in the last 168 hours.  BNP: BNP (last 3 results) Recent Labs    12/13/18 1750 12/15/18 0405 04/23/19 0945  BNP 1,156.1* 1,161.0* 2,909.9*    ProBNP (last 3 results) No results for input(s): PROBNP in the last 8760 hours.    Other results:  Imaging: DG Chest Port 1 View  Result Date: 05/21/2019 CLINICAL DATA:  Ventilator dependent respiratory failure. Follow-up pneumonia. EXAM: PORTABLE CHEST 1 VIEW COMPARISON:  05/20/2019 and earlier. FINDINGS: Tracheostomy tube tip in satisfactory position approximately 4 cm  above the carina. RIGHT jugular dual lumen central venous catheter tip projects over the LOWER SVC, unchanged. Feeding tube courses below the diaphragm into the stomach though its tip is not included on the image. Cardiac silhouette moderately enlarged, unchanged. Diffuse airspace opacities throughout both lungs, most confluent in the LEFT LOWER LOBE, improved since yesterday, though moderate airspace opacities persist. No new pulmonary parenchymal abnormalities. IMPRESSION: 1. Support apparatus satisfactory. 2. Improved BILATERAL pneumonia, most confluent in the LEFT LOWER LOBE, though moderate airspace opacities persist. 3. No new abnormalities. Electronically Signed   By: Evangeline Dakin M.D.   On: 05/21/2019 09:39   DG CHEST PORT 1 VIEW  Result Date: 05/20/2019 CLINICAL DATA:  34 year old male with shortness of breath. EXAM: PORTABLE CHEST 1 VIEW COMPARISON:  Chest radiograph dated 05/20/2019. FINDINGS: Tracheostomy with tip approximately 4 cm above the carina. Feeding tube extends  below the diaphragm with tip beyond the inferior margin of the image. Right IJ dialysis catheter in similar position. Diffuse bilateral airspace opacities progressed since the earlier radiograph. No pneumothorax. Stable cardiomediastinal silhouette. No acute osseous pathology. IMPRESSION: Interval progression of the diffuse bilateral airspace opacities. Electronically Signed   By: Anner Crete M.D.   On: 05/20/2019 18:51   DG CHEST PORT 1 VIEW  Result Date: 05/20/2019 CLINICAL DATA:  34 year old male with history of acute respiratory failure with hypoxemia. EXAM: PORTABLE CHEST 1 VIEW COMPARISON:  Chest x-ray 05/20/2019. FINDINGS: A tracheostomy tube is in place with tip 5.0 cm above the carina. Right internal jugular PermCath with tip terminating in the distal superior vena cava. A feeding tube is seen extending into the abdomen, however, the tip of the feeding tube extends below the lower margin of the image. Patchy multifocal airspace consolidation again noted throughout the lungs bilaterally (left greater than right), without significant improvement in aeration. No pleural effusions. Pulmonary vasculature is obscured. Heart size is mildly enlarged. Upper mediastinal contours are obscured. IMPRESSION: 1. Support apparatus, as above. 2. Severe multilobar pneumonia redemonstrated, as above. No significant change. 3. Cardiomegaly. Electronically Signed   By: Vinnie Langton M.D.   On: 05/20/2019 14:52   DG CHEST PORT 1 VIEW  Result Date: 05/20/2019 CLINICAL DATA:  Respiratory distress. Shortness of breath. History of heart failure, myocarditis. EXAM: PORTABLE CHEST 1 VIEW COMPARISON:  Radiographs 05/19/2019 and 05/10/2019. CT 12/13/2018. FINDINGS: 1136 hours. The tracheostomy, feeding tube and right IJ dialysis catheter appear unchanged. There are worsening bilateral airspace opacities with confluent components, especially in the right upper lobe. No pneumothorax or significant pleural effusion. The heart  is partly obscured by these airspace opacities. No significant osseous abnormalities. IMPRESSION: Significant worsening of bilateral airspace opacities since yesterday, consistent with edema, hemorrhage or infection. No pneumothorax or significant pleural effusion. Stable support system. Electronically Signed   By: Richardean Sale M.D.   On: 05/20/2019 11:52   Portable Chest x-ray  Result Date: 05/19/2019 CLINICAL DATA:  34 year old male with shortness of breath. EXAM: PORTABLE CHEST 1 VIEW COMPARISON:  Chest radiograph dated 05/10/2019. FINDINGS: Tracheostomy with tip approximately 5 cm above the carina. A feeding tube extends below the diaphragm with tip beyond the inferior margin of the image. Right sided dialysis catheter with tip in the region of the cavoatrial junction. There is diffuse interstitial and vascular prominence and hazy airspace opacity most consistent with edema. Pneumonia is not excluded. Clinical correlation is recommended. No large pleural effusion. No pneumothorax. There is cardiomegaly. No acute osseous pathology. IMPRESSION: Cardiomegaly with findings of edema or fluid overload.  Pneumonia is not excluded. Clinical correlation is recommended. Electronically Signed   By: Anner Crete M.D.   On: 05/19/2019 21:57     Medications:     Scheduled Medications: . sodium chloride   Intravenous Once  . amiodarone  200 mg Per Tube BID  . aspirin  81 mg Per Tube Daily  . atorvastatin  40 mg Per Tube q1800  . chlorhexidine gluconate (MEDLINE KIT)  15 mL Mouth Rinse BID  . Chlorhexidine Gluconate Cloth  6 each Topical Q0600  . feeding supplement (PRO-STAT SUGAR FREE 64)  60 mL Per Tube BID  . FLUoxetine  20 mg Per Tube Daily  . insulin aspart  0-20 Units Subcutaneous Q4H  . insulin aspart  5 Units Subcutaneous Q4H  . insulin detemir  25 Units Subcutaneous BID  . mouth rinse  15 mL Mouth Rinse 10 times per day  . midodrine  15 mg Per Tube TID WC  . pantoprazole sodium  40 mg Per  Tube Q1200  . sodium chloride flush  10-40 mL Intracatheter Q12H  . sodium chloride flush  3 mL Intravenous Q12H  . vecuronium  10 mg Intravenous Once    Infusions: . sodium chloride Stopped (05/04/19 0200)  . sodium chloride    . sodium chloride Stopped (05/21/19 0940)  . anticoagulant sodium citrate    . bivalirudin (ANGIOMAX) infusion 0.5 mg/mL (Non-ACS indications) 0.07 mg/kg/hr (05/21/19 1300)  . calcium gluconate infusion for CRRT 90 mL/hr at 05/21/19 0131  . feeding supplement (VITAL 1.5 CAL) 60 mL/hr at 05/20/19 1700  . fentaNYL infusion INTRAVENOUS 200 mcg/hr (05/21/19 1300)  . phenylephrine (NEO-SYNEPHRINE) Adult infusion 35 mcg/min (05/21/19 1300)  . prismasol B22GK 4/0 300 mL/hr at 05/21/19 0333  . prismasol B22GK 4/0 2,000 mL/hr at 05/21/19 1153  . sodium chloride    . sodium citrate 2 %/dextrose 2.5% solution 3000 mL 420 mL/hr at 05/21/19 1205    PRN Medications: sodium chloride, sodium chloride, anticoagulant sodium citrate, bisacodyl, docusate, fentaNYL (SUBLIMAZE) injection, loperamide HCl, sodium chloride, sodium chloride flush, sodium chloride flush, zolpidem   Assessment/Plan:   1. Acute on chronic systolic HF due to NICM-> cardiogenic shock - Echo EF < 20% - Off Norepi and milrinone.  - Recurrent pulmonary edema in the setting of trach change on 3/5.  Chest x-ray improving with volume removal through CVVHD.  Now keeping even.  2. AKI -> ESRD - started on CRRT 04/23/19 - making some urine, - per nephrology, goal is to challenge in iHD early next week - if unable to get him to tolerate iHD of pressors would be a palliative situation -[place] on CVVHD currently.  Discussed with nephrology.  3. Acute CVA with severe residual deficits due to extensive R cerebellar, midbrain, R MCA and L MCA infarcts  - likely cardioembolic - s/p clot extraction - Working with PT/OT   4. Acute hypoxic respiratory failure - s/p trach  - CXR 3/5 w/ worsening pulmonary edema  post trach change.  - Now weaning back ventilator per CCM.  5. Frequent PVCs/ventricular bigeminy/atrial tachycardia flutter - Rhythm improved on p.o. amio.  Can switch back to IV as needed  6. Apical Thrombus -Remains on bivalirudin.  Length of Stay: 29   Glori Bickers  MD 05/21/2019, 1:18 PM  Advanced Heart Failure Team Pager [phone] (M-F; 7a - 4p)  Please contact [facility] for night-coverage after hours (4p -7a ) and weekends on [email or web address]

## 2019-05-21 NOTE — Progress Notes (Signed)
NAME:  Carlos Rando., MRN:  [id number], DOB:  06-15-1985, LOS: 28 ADMISSION DATE:  04/20/2019, CONSULTATION DATE:  05/02/2019 REFERRING MD:  Dr. Leonel Ramsay, CHIEF COMPLAINT:  Slurred speech  Brief History   34 yo male smoker found to have slurred speech and Rt sided weakness.  Admitted with left MCA CVA and multiple smaller embolic infarcts s/p tPA and thrombectomy in IR.  Additionally found to have left ventricular  thrombus.    Course complicated by septic shock r/t HCAP, AKI requiring CRRT, polymorphic VT and cardiogenic shock.  Intermittent pressor dependence, required tracheostomy  Past Medical History  Systolic CHF with non ischemic CM, Cocaine abuse, OSA, DM Hx of CHF (EF 15%)  Hx myocarditis Smoker 1/2 ppd   Significant Hospital Events   2/05 Admit, tPA, IR thrombectomy 2/6 100% on fvent at 2300  Overnight requiring increasing levophed and phenylephrine.    2/6: switched to levophed, epi, started antibiotics, started on CRRT.  2/9  developed wide-complex tachycardia and hypotension, started on amiodarone drip, increase Levophed drip 2/15 drop in platelets, heparin stopped, bival started 2/17 Hypotensive and back on Levophed 2/18 trach, lines changed 2/19 started milrinone , fever 103 2/20 Episode of wide-complex tachycardia yesterday, changed from Levophed to vasopressin 2/22 stopping vanc. Still on inotrope support. Some AF w/ RVR. Had to be placed back on pressors. ivabradine added 2/23 still on pressors/ CRRT continued 2/24 tmax 98.4, Remains on CRRT, even UF, remains anuric, Levophed at 14 mcg/min, Milrinone at 0.125 mcg/kg/min, Coox 63.4 Doing well this morning on ATC, no events overnight.  Midodrine added   2/25 more interactive, remains on ATC, tmax 99.5/ WBC 21.4, ~900 ml liquid stool/ 24 hours, neg Cdiff, Levophed up to 20 mcg/min, CVP 2, ,milrinone remains at 0.125 mcg/kg/min, coox 92.7, Off CRRT since last night ~2000 s/p clotted x 3 off citrate, restarted   3/1 Amio drip restarted for WCT/atrial flutter, milrinone turned off 3/2 CRRT stopped, permacath placed  Consults:  Neuro IR Cardiology  Nephrology Heart Failure  EP   Procedures:  2/5-2/6:  TPA given at 428 am (total of 90 Mg) 520 am went to IR  S/P Lt common carotid arteriogram followed by complete revascularization of occluded LT MCA sup division mid M2 seg with x 1 pass with 26mx 40 mm solitaire X ret river device and penumbra aspiration with TICI 3 revascularization  ETT 2/05 >> 2/18 [facility]J CVL 2/5 >>2/18  RT [place] CVL 2/18 >>3/2 RIJ HD cath 2/6 >>2/18 6 shiley cuffed trach 2/18  >>3/4  RT fem HD 2/19 >>3/2 RIJ permacath 3/2>> 3/4 shiley 4 uncuffed.... dislodged placed back 6cuffed shiley that evening 3/5: change to #6 cuffed distal XLT  Significant Diagnostic Tests:  CT angio head/neck 2/05 >> occlusion of Lt MCA bifurcation Echo 2/05 >> EF less than 20%, cannot rule out apical thrombus MRI brain 2/11 > extensive acute infarction of multiple areas without large or medium vessel occlusion Echocardiogram 2/8 Left ventricular ejection fraction, by estimation, is <20%. The left  ventricle has severely decreased function. The left ventricle demonstrates  global hypokinesis. The left ventricular internal cavity size was severely  dilated. Possible small 0.8 x  0.6 cm apical thrombus. Somewhat subtle finding, could confirm with  cardiac MRI. However, if patient has had CVA, would be reasonable to  anticoagulate given severity of LV dysfunction if no contraindications to  anticoagulation.   Micro Data:  SARS CoV2 PCR 2/05 >> negative Influenza PCR 2/05 >> negative Urine 2/6 >>  ng resp 2/6 >> nml flora BC 2/6 >>ng resp 2/16 >> ng [facility] 2/19 >> negative  BC x 2 2/25 >>ng Cdiff 2/25 >> neg  Antimicrobials:  mero 2/6 -2/12 Zosyn 2/6 , 2/19 >> 2/23 vanc 2/6 >> 2/9 , 2/19 >>2/22  Interim history/subjective:  3/6: events noted yesterday evening as well post change to XLT.  Maintained on crrt and bp a bit soft this am. Will add low peripheral neo to cont to pull some volume if possible.   3/5: events last pm reviewed, trach was downsized earlier in day to uncuffed shiley4 but pt had desaturations and inability to pass suction catheter, bronch revealed dislodging of trach and pt was intubated from oral and perc trach redone with a 6 shiley cuffed placed. This am pt with grunting and increased wob on atc. Changed back to vent with loss of volumes. Stat cxr stable and emergent bronch eval with good position and no tissue seen overlaping trach.  3/4: Continues on trach collar Afebrile Remains off CRRT Loose stools, with Flexi-Seal   Objective   Blood pressure (!) 89/63, pulse 94, temperature 98.9 F (37.2 C), temperature source Axillary, resp. rate (!) 28, height 6' 1"  (1.854 m), weight 111.5 kg, SpO2 100 %.    Vent Mode: PCV FiO2 (%):  [80 %-100 %] 80 % Set Rate:  [18 bmp-28 bmp] 28 bmp Vt Set:  [640 mL] 640 mL PEEP:  [5 cmH20-10 cmH20] 10 cmH20 Plateau Pressure:  [26 cmH20-36 cmH20] 26 cmH20   Intake/Output Summary (Last 24 hours) at 05/21/2019 1122 Last data filed at 05/21/2019 1100 Gross per 24 hour  Intake 4420.26 ml  Output 4917 ml  Net -496.74 ml   Filed Weights   05/18/19 0500 05/19/19 0500 05/20/19 1000  Weight: 110.1 kg 108.1 kg 111.5 kg    Examination: Unchanged General: Well-built, well-nourished, sedated on vent HEENT: [place]/AT trach midline, MM pink/moist, mild pallor, no icterus Neuro: arousable, able to follow simple commands , moves all 4 extremities, restless CV: s1s2 regular rate and rhythm, no murmur, rubs, or gallops,  PULM:  Clear to ascultation bilaterally GI: soft, bowel sounds active in all 4 quadrants, non-tender, non-distended Extremities: warm/dry, no  edema  Skin: no rashes or lesions  Lab Results  Component Value Date   WBC 11.0 (H) 05/21/2019   HGB 10.5 (L) 05/21/2019   HCT 31.0 (L) 05/21/2019   MCV 101.4 (H) 05/21/2019    PLT 241 05/21/2019   Lab Results  Component Value Date   CREATININE 2.90 (H) 05/21/2019   BUN 36 (H) 05/21/2019   NA 138 05/21/2019   K 4.3 05/21/2019   CL 92 (L) 05/21/2019   CO2 30 05/21/2019       Resolved Hospital Problem list     HCAP 2/19- 2/23 zosyn/ vanc  Assessment & Plan:   Left MCA CVA Multiple other infarcts noted on MRI [id number]-[id number] embolic Acute metabolic encephalopathy -resolved P: aggressive PT -tolerating Ambien as needed for insomnia  Acute systolic HF with cardiogenic shock in setting of NICM, ejection fraction is less than 20% P: Heart failure team following, appreciate assistance On midodrine pulm edema yesterday improved today.     Left ventricle apical thrombus non ischemic cardiomyopathy with systolic congestive heart failure Polymorphic VT on 2/9, WCT 2/19 , 2/28 HLD P: Primary management per cardiology  Cont oral amiodarone  Daily ASA and Lipitor     Acute hypoxemic respiratory failure now tracheostomy dependent after stroke - last on MV 2/21 Plan  Continue aggressive pulmonary hygiene  -see event notes from overnight -#6cuffed distal XLT placed 3/5 SLP following , and progress with PMV valve per their recs Advance swallow per their recs -aeration improved on cxr, personally reviewed by me (l base is more)   Acute kidney injury -starting to make urine - CRRT started 2/6 Plan Nephrology following  Back on crrt    HIT without thrombosis but needs anticoagulation for embolic CVA/ LV thrombus - platelets recovered -SRA positive at 90 Plan Avoid heparin Continue Bivalirdin -transition to coumadin eventually once able to consistently stay off vent and take po Normocytic anemia:  -stable hgb -no overt bleeding and no acute indication for transfusion   DM type II with hyperglycemia Plan improved SSI   Hx of cocaine abuse. Depression. - not active per UDS  Plan Continue Prozac    Best practice:  Diet: NPO,  TFs  DVT prophylaxis: SCDs/ bivalirudin GI prophylaxis: protonix Mobility: progress when able Code Status: full code Disposition: ICU  Family -pending 3/6  Critical care time: The patient is critically ill with multiple organ systems failure and requires high complexity decision making for assessment and support, frequent evaluation and titration of therapies, application of advanced monitoring technologies and extensive interpretation of multiple databases.  Critical care time 38 mins. This represents my time independent of the NPs time taking care of the pt. This is excluding procedures.    [facility] 05/21/2019, 11:22 AM

## 2019-05-21 NOTE — Plan of Care (Signed)
  Problem: Clinical Measurements: Goal: Respiratory complications will improve Outcome: Progressing   

## 2019-05-21 NOTE — Progress Notes (Signed)
Assisted tele visit to patient with family member.  Rasaan Brotherton M, RN  

## 2019-05-22 DIAGNOSIS — J9601 Acute respiratory failure with hypoxia: Secondary | ICD-10-CM | POA: Diagnosis not present

## 2019-05-22 LAB — POCT I-STAT, CHEM 8
BUN: 25 mg/dL — ABNORMAL HIGH (ref 6–20)
BUN: 27 mg/dL — ABNORMAL HIGH (ref 6–20)
BUN: 27 mg/dL — ABNORMAL HIGH (ref 6–20)
BUN: 29 mg/dL — ABNORMAL HIGH (ref 6–20)
BUN: 30 mg/dL — ABNORMAL HIGH (ref 6–20)
BUN: 31 mg/dL — ABNORMAL HIGH (ref 6–20)
BUN: 25 mg/dL — ABNORMAL HIGH (ref 6–20)
BUN: 25 mg/dL — ABNORMAL HIGH (ref 6–20)
BUN: 28 mg/dL — ABNORMAL HIGH (ref 6–20)
BUN: 29 mg/dL — ABNORMAL HIGH (ref 6–20)
BUN: 22 mg/dL — ABNORMAL HIGH (ref 6–20)
BUN: 23 mg/dL — ABNORMAL HIGH (ref 6–20)
BUN: 24 mg/dL — ABNORMAL HIGH (ref 6–20)
BUN: 24 mg/dL — ABNORMAL HIGH (ref 6–20)
BUN: 27 mg/dL — ABNORMAL HIGH (ref 6–20)
BUN: 23 mg/dL — ABNORMAL HIGH (ref 6–20)
BUN: 24 mg/dL — ABNORMAL HIGH (ref 6–20)
BUN: 24 mg/dL — ABNORMAL HIGH (ref 6–20)
BUN: 26 mg/dL — ABNORMAL HIGH (ref 6–20)
BUN: 26 mg/dL — ABNORMAL HIGH (ref 6–20)
BUN: 27 mg/dL — ABNORMAL HIGH (ref 6–20)
BUN: 28 mg/dL — ABNORMAL HIGH (ref 6–20)
BUN: 20 mg/dL (ref 6–20)
BUN: 23 mg/dL — ABNORMAL HIGH (ref 6–20)
Calcium, Ion: 0.46 mmol/L — CL (ref 1.15–1.40)
Calcium, Ion: 0.48 mmol/L — CL (ref 1.15–1.40)
Calcium, Ion: 0.49 mmol/L — CL (ref 1.15–1.40)
Calcium, Ion: 0.96 mmol/L — ABNORMAL LOW (ref 1.15–1.40)
Calcium, Ion: 1 mmol/L — ABNORMAL LOW (ref 1.15–1.40)
Calcium, Ion: 1.01 mmol/L — ABNORMAL LOW (ref 1.15–1.40)
Calcium, Ion: 0.46 mmol/L — CL (ref 1.15–1.40)
Calcium, Ion: 0.47 mmol/L — CL (ref 1.15–1.40)
Calcium, Ion: 0.99 mmol/L — ABNORMAL LOW (ref 1.15–1.40)
Calcium, Ion: 1.01 mmol/L — ABNORMAL LOW (ref 1.15–1.40)
Calcium, Ion: 0.46 mmol/L — CL (ref 1.15–1.40)
Calcium, Ion: 0.47 mmol/L — CL (ref 1.15–1.40)
Calcium, Ion: 0.47 mmol/L — CL (ref 1.15–1.40)
Calcium, Ion: 1.07 mmol/L — ABNORMAL LOW (ref 1.15–1.40)
Calcium, Ion: 1.07 mmol/L — ABNORMAL LOW (ref 1.15–1.40)
Calcium, Ion: 0.47 mmol/L — CL (ref 1.15–1.40)
Calcium, Ion: 0.47 mmol/L — CL (ref 1.15–1.40)
Calcium, Ion: 0.48 mmol/L — CL (ref 1.15–1.40)
Calcium, Ion: 1.02 mmol/L — ABNORMAL LOW (ref 1.15–1.40)
Calcium, Ion: 1.04 mmol/L — ABNORMAL LOW (ref 1.15–1.40)
Calcium, Ion: 1.06 mmol/L — ABNORMAL LOW (ref 1.15–1.40)
Calcium, Ion: 1.08 mmol/L — ABNORMAL LOW (ref 1.15–1.40)
Calcium, Ion: 0.45 mmol/L — CL (ref 1.15–1.40)
Calcium, Ion: 1.06 mmol/L — ABNORMAL LOW (ref 1.15–1.40)
Chloride: 87 mmol/L — ABNORMAL LOW (ref 98–111)
Chloride: 88 mmol/L — ABNORMAL LOW (ref 98–111)
Chloride: 89 mmol/L — ABNORMAL LOW (ref 98–111)
Chloride: 89 mmol/L — ABNORMAL LOW (ref 98–111)
Chloride: 90 mmol/L — ABNORMAL LOW (ref 98–111)
Chloride: 91 mmol/L — ABNORMAL LOW (ref 98–111)
Chloride: 85 mmol/L — ABNORMAL LOW (ref 98–111)
Chloride: 87 mmol/L — ABNORMAL LOW (ref 98–111)
Chloride: 87 mmol/L — ABNORMAL LOW (ref 98–111)
Chloride: 88 mmol/L — ABNORMAL LOW (ref 98–111)
Chloride: 83 mmol/L — ABNORMAL LOW (ref 98–111)
Chloride: 84 mmol/L — ABNORMAL LOW (ref 98–111)
Chloride: 85 mmol/L — ABNORMAL LOW (ref 98–111)
Chloride: 85 mmol/L — ABNORMAL LOW (ref 98–111)
Chloride: 85 mmol/L — ABNORMAL LOW (ref 98–111)
Chloride: 82 mmol/L — ABNORMAL LOW (ref 98–111)
Chloride: 84 mmol/L — ABNORMAL LOW (ref 98–111)
Chloride: 85 mmol/L — ABNORMAL LOW (ref 98–111)
Chloride: 86 mmol/L — ABNORMAL LOW (ref 98–111)
Chloride: 87 mmol/L — ABNORMAL LOW (ref 98–111)
Chloride: 87 mmol/L — ABNORMAL LOW (ref 98–111)
Chloride: 87 mmol/L — ABNORMAL LOW (ref 98–111)
Chloride: 81 mmol/L — ABNORMAL LOW (ref 98–111)
Chloride: 83 mmol/L — ABNORMAL LOW (ref 98–111)
Creatinine, Ser: 2.2 mg/dL — ABNORMAL HIGH (ref 0.61–1.24)
Creatinine, Ser: 2.2 mg/dL — ABNORMAL HIGH (ref 0.61–1.24)
Creatinine, Ser: 2.2 mg/dL — ABNORMAL HIGH (ref 0.61–1.24)
Creatinine, Ser: 2.6 mg/dL — ABNORMAL HIGH (ref 0.61–1.24)
Creatinine, Ser: 2.7 mg/dL — ABNORMAL HIGH (ref 0.61–1.24)
Creatinine, Ser: 2.7 mg/dL — ABNORMAL HIGH (ref 0.61–1.24)
Creatinine, Ser: 2.1 mg/dL — ABNORMAL HIGH (ref 0.61–1.24)
Creatinine, Ser: 2.2 mg/dL — ABNORMAL HIGH (ref 0.61–1.24)
Creatinine, Ser: 2.4 mg/dL — ABNORMAL HIGH (ref 0.61–1.24)
Creatinine, Ser: 2.5 mg/dL — ABNORMAL HIGH (ref 0.61–1.24)
Creatinine, Ser: 1.9 mg/dL — ABNORMAL HIGH (ref 0.61–1.24)
Creatinine, Ser: 2 mg/dL — ABNORMAL HIGH (ref 0.61–1.24)
Creatinine, Ser: 2 mg/dL — ABNORMAL HIGH (ref 0.61–1.24)
Creatinine, Ser: 2.3 mg/dL — ABNORMAL HIGH (ref 0.61–1.24)
Creatinine, Ser: 2.4 mg/dL — ABNORMAL HIGH (ref 0.61–1.24)
Creatinine, Ser: 1.9 mg/dL — ABNORMAL HIGH (ref 0.61–1.24)
Creatinine, Ser: 1.9 mg/dL — ABNORMAL HIGH (ref 0.61–1.24)
Creatinine, Ser: 2.1 mg/dL — ABNORMAL HIGH (ref 0.61–1.24)
Creatinine, Ser: 2.3 mg/dL — ABNORMAL HIGH (ref 0.61–1.24)
Creatinine, Ser: 2.4 mg/dL — ABNORMAL HIGH (ref 0.61–1.24)
Creatinine, Ser: 2.4 mg/dL — ABNORMAL HIGH (ref 0.61–1.24)
Creatinine, Ser: 2.4 mg/dL — ABNORMAL HIGH (ref 0.61–1.24)
Creatinine, Ser: 1.9 mg/dL — ABNORMAL HIGH (ref 0.61–1.24)
Creatinine, Ser: 2.2 mg/dL — ABNORMAL HIGH (ref 0.61–1.24)
Glucose, Bld: 204 mg/dL — ABNORMAL HIGH (ref 70–99)
Glucose, Bld: 229 mg/dL — ABNORMAL HIGH (ref 70–99)
Glucose, Bld: 238 mg/dL — ABNORMAL HIGH (ref 70–99)
Glucose, Bld: 240 mg/dL — ABNORMAL HIGH (ref 70–99)
Glucose, Bld: 258 mg/dL — ABNORMAL HIGH (ref 70–99)
Glucose, Bld: 270 mg/dL — ABNORMAL HIGH (ref 70–99)
Glucose, Bld: 270 mg/dL — ABNORMAL HIGH (ref 70–99)
Glucose, Bld: 294 mg/dL — ABNORMAL HIGH (ref 70–99)
Glucose, Bld: 300 mg/dL — ABNORMAL HIGH (ref 70–99)
Glucose, Bld: 319 mg/dL — ABNORMAL HIGH (ref 70–99)
Glucose, Bld: 211 mg/dL — ABNORMAL HIGH (ref 70–99)
Glucose, Bld: 223 mg/dL — ABNORMAL HIGH (ref 70–99)
Glucose, Bld: 224 mg/dL — ABNORMAL HIGH (ref 70–99)
Glucose, Bld: 265 mg/dL — ABNORMAL HIGH (ref 70–99)
Glucose, Bld: 305 mg/dL — ABNORMAL HIGH (ref 70–99)
Glucose, Bld: 167 mg/dL — ABNORMAL HIGH (ref 70–99)
Glucose, Bld: 218 mg/dL — ABNORMAL HIGH (ref 70–99)
Glucose, Bld: 262 mg/dL — ABNORMAL HIGH (ref 70–99)
Glucose, Bld: 263 mg/dL — ABNORMAL HIGH (ref 70–99)
Glucose, Bld: 267 mg/dL — ABNORMAL HIGH (ref 70–99)
Glucose, Bld: 273 mg/dL — ABNORMAL HIGH (ref 70–99)
Glucose, Bld: 300 mg/dL — ABNORMAL HIGH (ref 70–99)
Glucose, Bld: 233 mg/dL — ABNORMAL HIGH (ref 70–99)
Glucose, Bld: 288 mg/dL — ABNORMAL HIGH (ref 70–99)
HCT: 23 % — ABNORMAL LOW (ref 39.0–52.0)
HCT: 23 % — ABNORMAL LOW (ref 39.0–52.0)
HCT: 24 % — ABNORMAL LOW (ref 39.0–52.0)
HCT: 26 % — ABNORMAL LOW (ref 39.0–52.0)
HCT: 26 % — ABNORMAL LOW (ref 39.0–52.0)
HCT: 26 % — ABNORMAL LOW (ref 39.0–52.0)
HCT: 23 % — ABNORMAL LOW (ref 39.0–52.0)
HCT: 24 % — ABNORMAL LOW (ref 39.0–52.0)
HCT: 24 % — ABNORMAL LOW (ref 39.0–52.0)
HCT: 25 % — ABNORMAL LOW (ref 39.0–52.0)
HCT: 24 % — ABNORMAL LOW (ref 39.0–52.0)
HCT: 25 % — ABNORMAL LOW (ref 39.0–52.0)
HCT: 25 % — ABNORMAL LOW (ref 39.0–52.0)
HCT: 26 % — ABNORMAL LOW (ref 39.0–52.0)
HCT: 27 % — ABNORMAL LOW (ref 39.0–52.0)
HCT: 23 % — ABNORMAL LOW (ref 39.0–52.0)
HCT: 24 % — ABNORMAL LOW (ref 39.0–52.0)
HCT: 25 % — ABNORMAL LOW (ref 39.0–52.0)
HCT: 26 % — ABNORMAL LOW (ref 39.0–52.0)
HCT: 26 % — ABNORMAL LOW (ref 39.0–52.0)
HCT: 27 % — ABNORMAL LOW (ref 39.0–52.0)
HCT: 27 % — ABNORMAL LOW (ref 39.0–52.0)
HCT: 23 % — ABNORMAL LOW (ref 39.0–52.0)
HCT: 25 % — ABNORMAL LOW (ref 39.0–52.0)
Hemoglobin: 7.8 g/dL — ABNORMAL LOW (ref 13.0–17.0)
Hemoglobin: 7.8 g/dL — ABNORMAL LOW (ref 13.0–17.0)
Hemoglobin: 8.2 g/dL — ABNORMAL LOW (ref 13.0–17.0)
Hemoglobin: 8.8 g/dL — ABNORMAL LOW (ref 13.0–17.0)
Hemoglobin: 8.8 g/dL — ABNORMAL LOW (ref 13.0–17.0)
Hemoglobin: 8.8 g/dL — ABNORMAL LOW (ref 13.0–17.0)
Hemoglobin: 7.8 g/dL — ABNORMAL LOW (ref 13.0–17.0)
Hemoglobin: 8.2 g/dL — ABNORMAL LOW (ref 13.0–17.0)
Hemoglobin: 8.2 g/dL — ABNORMAL LOW (ref 13.0–17.0)
Hemoglobin: 8.5 g/dL — ABNORMAL LOW (ref 13.0–17.0)
Hemoglobin: 8.2 g/dL — ABNORMAL LOW (ref 13.0–17.0)
Hemoglobin: 8.5 g/dL — ABNORMAL LOW (ref 13.0–17.0)
Hemoglobin: 8.5 g/dL — ABNORMAL LOW (ref 13.0–17.0)
Hemoglobin: 8.8 g/dL — ABNORMAL LOW (ref 13.0–17.0)
Hemoglobin: 9.2 g/dL — ABNORMAL LOW (ref 13.0–17.0)
Hemoglobin: 7.8 g/dL — ABNORMAL LOW (ref 13.0–17.0)
Hemoglobin: 8.2 g/dL — ABNORMAL LOW (ref 13.0–17.0)
Hemoglobin: 8.5 g/dL — ABNORMAL LOW (ref 13.0–17.0)
Hemoglobin: 8.8 g/dL — ABNORMAL LOW (ref 13.0–17.0)
Hemoglobin: 8.8 g/dL — ABNORMAL LOW (ref 13.0–17.0)
Hemoglobin: 9.2 g/dL — ABNORMAL LOW (ref 13.0–17.0)
Hemoglobin: 9.2 g/dL — ABNORMAL LOW (ref 13.0–17.0)
Hemoglobin: 7.8 g/dL — ABNORMAL LOW (ref 13.0–17.0)
Hemoglobin: 8.5 g/dL — ABNORMAL LOW (ref 13.0–17.0)
Potassium: 3.2 mmol/L — ABNORMAL LOW (ref 3.5–5.1)
Potassium: 3.2 mmol/L — ABNORMAL LOW (ref 3.5–5.1)
Potassium: 3.3 mmol/L — ABNORMAL LOW (ref 3.5–5.1)
Potassium: 3.3 mmol/L — ABNORMAL LOW (ref 3.5–5.1)
Potassium: 3.4 mmol/L — ABNORMAL LOW (ref 3.5–5.1)
Potassium: 3.4 mmol/L — ABNORMAL LOW (ref 3.5–5.1)
Potassium: 3.1 mmol/L — ABNORMAL LOW (ref 3.5–5.1)
Potassium: 3.1 mmol/L — ABNORMAL LOW (ref 3.5–5.1)
Potassium: 3.1 mmol/L — ABNORMAL LOW (ref 3.5–5.1)
Potassium: 3.2 mmol/L — ABNORMAL LOW (ref 3.5–5.1)
Potassium: 3.2 mmol/L — ABNORMAL LOW (ref 3.5–5.1)
Potassium: 3.3 mmol/L — ABNORMAL LOW (ref 3.5–5.1)
Potassium: 3.3 mmol/L — ABNORMAL LOW (ref 3.5–5.1)
Potassium: 3.4 mmol/L — ABNORMAL LOW (ref 3.5–5.1)
Potassium: 3.4 mmol/L — ABNORMAL LOW (ref 3.5–5.1)
Potassium: 3.2 mmol/L — ABNORMAL LOW (ref 3.5–5.1)
Potassium: 3.2 mmol/L — ABNORMAL LOW (ref 3.5–5.1)
Potassium: 3.3 mmol/L — ABNORMAL LOW (ref 3.5–5.1)
Potassium: 3.4 mmol/L — ABNORMAL LOW (ref 3.5–5.1)
Potassium: 3.4 mmol/L — ABNORMAL LOW (ref 3.5–5.1)
Potassium: 3.5 mmol/L (ref 3.5–5.1)
Potassium: 3.5 mmol/L (ref 3.5–5.1)
Potassium: 3.1 mmol/L — ABNORMAL LOW (ref 3.5–5.1)
Potassium: 3.2 mmol/L — ABNORMAL LOW (ref 3.5–5.1)
Sodium: 137 mmol/L (ref 135–145)
Sodium: 138 mmol/L (ref 135–145)
Sodium: 138 mmol/L (ref 135–145)
Sodium: 139 mmol/L (ref 135–145)
Sodium: 139 mmol/L (ref 135–145)
Sodium: 140 mmol/L (ref 135–145)
Sodium: 137 mmol/L (ref 135–145)
Sodium: 138 mmol/L (ref 135–145)
Sodium: 139 mmol/L (ref 135–145)
Sodium: 140 mmol/L (ref 135–145)
Sodium: 139 mmol/L (ref 135–145)
Sodium: 139 mmol/L (ref 135–145)
Sodium: 140 mmol/L (ref 135–145)
Sodium: 140 mmol/L (ref 135–145)
Sodium: 141 mmol/L (ref 135–145)
Sodium: 138 mmol/L (ref 135–145)
Sodium: 138 mmol/L (ref 135–145)
Sodium: 139 mmol/L (ref 135–145)
Sodium: 139 mmol/L (ref 135–145)
Sodium: 140 mmol/L (ref 135–145)
Sodium: 140 mmol/L (ref 135–145)
Sodium: 141 mmol/L (ref 135–145)
Sodium: 139 mmol/L (ref 135–145)
Sodium: 140 mmol/L (ref 135–145)
TCO2: 33 mmol/L — ABNORMAL HIGH (ref 22–32)
TCO2: 33 mmol/L — ABNORMAL HIGH (ref 22–32)
TCO2: 34 mmol/L — ABNORMAL HIGH (ref 22–32)
TCO2: 37 mmol/L — ABNORMAL HIGH (ref 22–32)
TCO2: 37 mmol/L — ABNORMAL HIGH (ref 22–32)
TCO2: 37 mmol/L — ABNORMAL HIGH (ref 22–32)
TCO2: 34 mmol/L — ABNORMAL HIGH (ref 22–32)
TCO2: 35 mmol/L — ABNORMAL HIGH (ref 22–32)
TCO2: 38 mmol/L — ABNORMAL HIGH (ref 22–32)
TCO2: 39 mmol/L — ABNORMAL HIGH (ref 22–32)
TCO2: 36 mmol/L — ABNORMAL HIGH (ref 22–32)
TCO2: 38 mmol/L — ABNORMAL HIGH (ref 22–32)
TCO2: 41 mmol/L — ABNORMAL HIGH (ref 22–32)
TCO2: 43 mmol/L — ABNORMAL HIGH (ref 22–32)
TCO2: 44 mmol/L — ABNORMAL HIGH (ref 22–32)
TCO2: 37 mmol/L — ABNORMAL HIGH (ref 22–32)
TCO2: 38 mmol/L — ABNORMAL HIGH (ref 22–32)
TCO2: 40 mmol/L — ABNORMAL HIGH (ref 22–32)
TCO2: 41 mmol/L — ABNORMAL HIGH (ref 22–32)
TCO2: 42 mmol/L — ABNORMAL HIGH (ref 22–32)
TCO2: 44 mmol/L — ABNORMAL HIGH (ref 22–32)
TCO2: 46 mmol/L — ABNORMAL HIGH (ref 22–32)
TCO2: 43 mmol/L — ABNORMAL HIGH (ref 22–32)
TCO2: 43 mmol/L — ABNORMAL HIGH (ref 22–32)

## 2019-05-22 LAB — RENAL FUNCTION PANEL
Albumin: 2.2 g/dL — ABNORMAL LOW (ref 3.5–5.0)
Albumin: 2.4 g/dL — ABNORMAL LOW (ref 3.5–5.0)
Anion gap: 18 — ABNORMAL HIGH (ref 5–15)
Anion gap: 18 — ABNORMAL HIGH (ref 5–15)
BUN: 31 mg/dL — ABNORMAL HIGH (ref 6–20)
BUN: 25 mg/dL — ABNORMAL HIGH (ref 6–20)
CO2: 33 mmol/L — ABNORMAL HIGH (ref 22–32)
CO2: 39 mmol/L — ABNORMAL HIGH (ref 22–32)
Calcium: 9.8 mg/dL (ref 8.9–10.3)
Calcium: 10.7 mg/dL — ABNORMAL HIGH (ref 8.9–10.3)
Chloride: 88 mmol/L — ABNORMAL LOW (ref 98–111)
Chloride: 85 mmol/L — ABNORMAL LOW (ref 98–111)
Creatinine, Ser: 2.59 mg/dL — ABNORMAL HIGH (ref 0.61–1.24)
Creatinine, Ser: 2.38 mg/dL — ABNORMAL HIGH (ref 0.61–1.24)
GFR calc Af Amer: 36 mL/min — ABNORMAL LOW (ref >60)
GFR calc Af Amer: 40 mL/min — ABNORMAL LOW (ref >60)
GFR calc non Af Amer: 31 mL/min — ABNORMAL LOW (ref >60)
GFR calc non Af Amer: 34 mL/min — ABNORMAL LOW (ref >60)
Glucose, Bld: 266 mg/dL — ABNORMAL HIGH (ref 70–99)
Glucose, Bld: 165 mg/dL — ABNORMAL HIGH (ref 70–99)
Phosphorus: 2.4 mg/dL — ABNORMAL LOW (ref 2.5–4.6)
Phosphorus: 3 mg/dL (ref 2.5–4.6)
Potassium: 3.3 mmol/L — ABNORMAL LOW (ref 3.5–5.1)
Potassium: 3.5 mmol/L (ref 3.5–5.1)
Sodium: 139 mmol/L (ref 135–145)
Sodium: 142 mmol/L (ref 135–145)

## 2019-05-22 LAB — CBC
HCT: 24.2 % — ABNORMAL LOW (ref 39.0–52.0)
Hemoglobin: 7.3 g/dL — ABNORMAL LOW (ref 13.0–17.0)
MCH: 30.4 pg (ref 26.0–34.0)
MCHC: 30.2 g/dL (ref 30.0–36.0)
MCV: 100.8 fL — ABNORMAL HIGH (ref 80.0–100.0)
Platelets: 222 10*3/uL (ref 150–400)
RBC: 2.4 MIL/uL — ABNORMAL LOW (ref 4.22–5.81)
RDW: 14.2 % (ref 11.5–15.5)
WBC: 6.6 10*3/uL (ref 4.0–10.5)
nRBC: 0 % (ref 0.0–0.2)

## 2019-05-22 LAB — MAGNESIUM: Magnesium: 1.8 mg/dL (ref 1.7–2.4)

## 2019-05-22 LAB — GLUCOSE, CAPILLARY
Glucose-Capillary: 221 mg/dL — ABNORMAL HIGH (ref 70–99)
Glucose-Capillary: 243 mg/dL — ABNORMAL HIGH (ref 70–99)
Glucose-Capillary: 196 mg/dL — ABNORMAL HIGH (ref 70–99)
Glucose-Capillary: 152 mg/dL — ABNORMAL HIGH (ref 70–99)
Glucose-Capillary: 200 mg/dL — ABNORMAL HIGH (ref 70–99)
Glucose-Capillary: 144 mg/dL — ABNORMAL HIGH (ref 70–99)

## 2019-05-22 LAB — APTT: aPTT: 59 seconds — ABNORMAL HIGH (ref 24–36)

## 2019-05-22 LAB — CALCIUM, IONIZED
Calcium, Ionized, Serum: 3.8 mg/dL — ABNORMAL LOW (ref 4.5–5.6)
Calcium, Ionized, Serum: 4.6 mg/dL (ref 4.5–5.6)

## 2019-05-22 MED ORDER — MAGNESIUM SULFATE IN D5W 1-5 GM/100ML-% IV SOLN
1.0000 g | Freq: Once | INTRAVENOUS | Status: AC
  Administered 2019-05-22: 1 g via INTRAVENOUS
  Filled 2019-05-22: qty 100

## 2019-05-22 MED ORDER — POTASSIUM CHLORIDE 20 MEQ/15ML (10%) PO SOLN
20.0000 meq | Freq: Once | ORAL | Status: AC
  Administered 2019-05-22: 20 meq
  Filled 2019-05-22: qty 15

## 2019-05-22 NOTE — Progress Notes (Signed)
eLink Physician-Brief Progress Note Patient Name: Carlos Brewer. DOB: 01/23/1986 MRN: [id number]   Date of Service  05/22/2019  HPI/Events of Note  K 3.1, Mg 1.8 ongoing CRRT  eICU Interventions  Ordered k 20 meqs per tube and MgSO4 1 gram IV Will defer further corrections to nephrology     Intervention Category Major Interventions: Electrolyte abnormality - evaluation and management  Judd Lien 05/22/2019, 6:32 AM

## 2019-05-22 NOTE — Progress Notes (Signed)
Assisted tele visit to patient with family member.  Tenee Wish M, RN  

## 2019-05-22 NOTE — Progress Notes (Signed)
ANTICOAGULATION CONSULT NOTE  Pharmacy Consult:  Bivalirudin Indication: stroke 2/5, LV thrombus 2/8, HIT 2/15   Allergies  Allergen Reactions  . Heparin Other (See Comments)    Heparin induced thrombocytopenia. 2/15 HIT OD 1.692. 2/16 SRA positive-90.     Patient Measurements: Height: 6\' 1"  (185.4 cm) Weight: 244 lb 11.4 oz (111 kg) IBW/kg (Calculated) : 79.9   Vital Signs: Temp: 98.3 F (36.8 C) (03/07 0400) Temp Source: Axillary (03/07 0400) BP: 96/69 (03/07 0752) Pulse Rate: 77 (03/07 0752)  Labs: Recent Labs    05/20/19 0612 05/20/19 1203 05/21/19 0500 05/21/19 8421 05/22/19 0447 05/22/19 0447 05/22/19 0605 05/22/19 0611  HGB 7.9*   < > 8.2*   < > 7.3*   < > 8.2* 7.8*  HCT 25.1*   < > 28.0*   < > 24.2*  --  24.0* 23.0*  PLT 213  --  241  --  222  --   --   --   APTT 57*  --  59*  --  59*  --   --   --   CREATININE 7.14*   < > 3.89*   < > 2.59*  --  2.20* 2.40*   < > = values in this interval not displayed.    Estimated Creatinine Clearance: 56.6 mL/min (A) (by C-G formula based on SCr of 2.4 mg/dL (H)).  Assessment: 34 yr old male with left MCA infarct - received TPA and revascularization with IR on 2/5. On 2/8 found to have small LV apical thrombus on ECHO. Pharmacy consulted to dose IV heparin, which was switched to bivalirudin 2/15 for HIT. 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 SRA very positive at 24. Heparin allergy has appropriately been added to chart and updated with SRA results.   APTT is therapeutic this morning at 59; no bleeding reported. Hemoglobin stable 8.2. Platelet count WNL.   Goal of Therapy:  APTT 50-65 sec  Monitor platelets by anticoagulation protocol: Yes   Plan:  - Continue bivalirudin infusion at 0.07 mg/kg/hr - Daily aPTT and CBC - Monitor for s/sx of bleeding - F/u transition to warfarin  Alanda Slim, PharmD, [facility] Clinical Pharmacist Please see AMION for all Pharmacists' Contact Phone  Numbers 05/22/2019, 8:49 AM

## 2019-05-22 NOTE — Progress Notes (Signed)
Witnessed Risk manager asking the pt if he would like his mother to be making his medical decisions.  Pt nodded yes to this.

## 2019-05-22 NOTE — Progress Notes (Signed)
[facility] Progress Note    Assessment/ Plan:   1. AKIpresumably due to ATN with ischemic/nephrotoxic insults worsened by cardiogenic shock. Started on CRRT 04/23/19. Resumed CVVHD2/19/21 due to hyperkalemiawhich has resolved.  CRRT resumed on 2/25-3/2with citrate for clearance and then 3/5- present .  1. He's actually makingurine but not getting clearance. 2. Failedfluid challenge -> plan on challenging with iHD Fridaybut unfortunately due to staffing issues we will have to use CRRT. 3. Has had clotting issues when off citrate even on angiomax. 4. Seen on  CRRT -> Plan to stop Mon afternoon ~4PM and don't restart if filter clots off. Goal is to challenge in iHD early next week (Tues or Wed) -> if he doesn't tolerate iHD will need  meeting with mother to define goals of care. Can't continue CRRT indefinitely. 2. Cardiogenic shock- Hx milrinone due to development of a fib with rvr and wide complex tachycardia. -Known severe NICM with EF <20% andmay not tolerate but also can't continue CRRT indefinitely. - If he doesn't tolerate iHD will need meeting with mother to define goals of care.  3. Leukocytosis - improving last check. abx per primary team. Blood cultures sent 2/25 - listed as NGTD though low volume. 4. Acute stroke- presumed embolic with bilateral infarcts and LV thrombus- received tpa on admission 5. SVT/polymorphic VT- per Cardiology 6. Acute hypoxic respiratory failure- s/p trach  7. 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 HIT Ab+on Argatroban. 8. Disposition- overall prognosis is poor with ongoing RRT (has not tolerated stopping CVVHD due to hypercatabolic state and concern for poor tolerance of intermittent HD), trach, as well as cardiogenic shock/cardiomyopathy.  Subjective:   BP still  Soft; he's more awake today On neo.   Objective:   BP 99/70   Pulse 94   Temp 97.8 F (36.6 C) (Axillary)   Resp (!) 28   Ht 6' 1"  (1.854 m)    Wt 111 kg   SpO2 100%   BMI 32.29 kg/m   Intake/Output Summary (Last 24 hours) at 05/22/2019 1310 Last data filed at 05/22/2019 1300 Gross per 24 hour  Intake 6739 ml  Output 7025 ml  Net -286 ml   Weight change: -0.5 kg  Physical Exam: Gen: awake in bed, trach collar, nonverbal. HEENT - trach in place CVS: S1S2 no rub Resp:reduced breath sounds Abd: soft/ND Ext: he has no edema Access: rightIJtunneled catheter  Imaging: DG Chest Port 1 View  Result Date: 05/21/2019 CLINICAL DATA:  Ventilator dependent respiratory failure. Follow-up pneumonia. EXAM: PORTABLE CHEST 1 VIEW COMPARISON:  05/20/2019 and earlier. FINDINGS: Tracheostomy tube tip in satisfactory position approximately 4 cm above the carina. RIGHT jugular dual lumen central venous catheter tip projects over the LOWER SVC, unchanged. Feeding tube courses below the diaphragm into the stomach though its tip is not included on the image. Cardiac silhouette moderately enlarged, unchanged. Diffuse airspace opacities throughout both lungs, most confluent in the LEFT LOWER LOBE, improved since yesterday, though moderate airspace opacities persist. No new pulmonary parenchymal abnormalities. IMPRESSION: 1. Support apparatus satisfactory. 2. Improved BILATERAL pneumonia, most confluent in the LEFT LOWER LOBE, though moderate airspace opacities persist. 3. No new abnormalities. Electronically Signed   By: Evangeline Dakin M.D.   On: 05/21/2019 09:39   DG CHEST PORT 1 VIEW  Result Date: 05/20/2019 CLINICAL DATA:  34 year old male with shortness of breath. EXAM: PORTABLE CHEST 1 VIEW COMPARISON:  Chest radiograph dated 05/20/2019. FINDINGS: Tracheostomy with tip approximately 4 cm above the carina. Feeding tube  extends below the diaphragm with tip beyond the inferior margin of the image. Right IJ dialysis catheter in similar position. Diffuse bilateral airspace opacities progressed since the earlier radiograph. No pneumothorax. Stable  cardiomediastinal silhouette. No acute osseous pathology. IMPRESSION: Interval progression of the diffuse bilateral airspace opacities. Electronically Signed   By: Anner Crete M.D.   On: 05/20/2019 18:51   DG CHEST PORT 1 VIEW  Result Date: 05/20/2019 CLINICAL DATA:  34 year old male with history of acute respiratory failure with hypoxemia. EXAM: PORTABLE CHEST 1 VIEW COMPARISON:  Chest x-ray 05/20/2019. FINDINGS: A tracheostomy tube is in place with tip 5.0 cm above the carina. Right internal jugular PermCath with tip terminating in the distal superior vena cava. A feeding tube is seen extending into the abdomen, however, the tip of the feeding tube extends below the lower margin of the image. Patchy multifocal airspace consolidation again noted throughout the lungs bilaterally (left greater than right), without significant improvement in aeration. No pleural effusions. Pulmonary vasculature is obscured. Heart size is mildly enlarged. Upper mediastinal contours are obscured. IMPRESSION: 1. Support apparatus, as above. 2. Severe multilobar pneumonia redemonstrated, as above. No significant change. 3. Cardiomegaly. Electronically Signed   By: Vinnie Langton M.D.   On: 05/20/2019 14:52    Labs: [employer] Recent Labs  Lab 05/18/19 1700 05/18/19 1700 05/19/19 0447 05/19/19 0447 05/19/19 1627 05/19/19 1627 05/20/19 0612 05/20/19 1203 05/20/19 1627 05/20/19 1718 05/21/19 0500 05/21/19 7902 05/22/19 0202 05/22/19 4097 05/22/19 0414 05/22/19 0420 05/22/19 0447 05/22/19 0605 05/22/19 0611  NA 139   < > 139   < > 139   < > 140   < > 138   < > 138   < > 139 138 139 137 139 140 138  K 3.1*   < > 3.0*   < > 3.6   < > 3.3*   < > 3.8   < > 4.3   < > 3.3* 3.3* 3.2* 3.1* 3.3* 3.1* 3.1*  CL 97*   < > 97*   < > 99   < > 102   < > 100   < > 94*   < > 88* 89* 87* 88* 88* 85* 87*  CO2 27  --  27  --  21*  --  23  --  24  --  30  --   --   --   --   --  33*  --   --   GLUCOSE 156*   < > 184*   < > 167*    < > 184*   < > 240*   < > 295*   < > 258* 229* 300* 270* 266* 319* 294*  BUN 64*   < > 86*   < > 95*   < > 104*   < > 77*   < > 45*   < > 27* 30* 25* 29* 31* 25* 28*  CREATININE 4.92*   < > 5.63*   < > 6.20*   < > 7.14*   < > 5.40*   < > 3.89*   < > 2.20* 2.70* 2.10* 2.50* 2.59* 2.20* 2.40*  CALCIUM 9.2  --  9.2  --  9.0  --  8.9  --  8.0*  --  8.2*  --   --   --   --   --  9.8  --   --   PHOS 5.7*  --  6.5*  --  5.3*  --  6.5*  --  4.0  --  3.8  --   --   --   --   --  2.4*  --   --    < > = values in this interval not displayed.   CBC Recent Labs  Lab 05/19/19 0447 05/19/19 0447 05/20/19 0612 05/20/19 1203 05/21/19 0500 05/21/19 0621 05/22/19 0420 05/22/19 0447 05/22/19 0605 05/22/19 0611  WBC 6.0  --  8.4  --  11.0*  --   --  6.6  --   --   HGB 8.5*   < > 7.9*   < > 8.2*   < > 8.2* 7.3* 8.2* 7.8*  HCT 28.0*   < > 25.1*   < > 28.0*   < > 24.0* 24.2* 24.0* 23.0*  MCV 100.4*  --  97.7  --  101.4*  --   --  100.8*  --   --   PLT 216  --  213  --  241  --   --  222  --   --    < > = values in this interval not displayed.    Medications:    . amiodarone  200 mg Per Tube BID  . aspirin  81 mg Per Tube Daily  . atorvastatin  40 mg Per Tube q1800  . chlorhexidine gluconate (MEDLINE KIT)  15 mL Mouth Rinse BID  . Chlorhexidine Gluconate Cloth  6 each Topical Q0600  . feeding supplement (PRO-STAT SUGAR FREE 64)  60 mL Per Tube BID  . FLUoxetine  20 mg Per Tube Daily  . insulin aspart  0-20 Units Subcutaneous Q4H  . insulin aspart  5 Units Subcutaneous Q4H  . insulin detemir  25 Units Subcutaneous BID  . mouth rinse  15 mL Mouth Rinse 10 times per day  . midodrine  15 mg Per Tube TID WC  . pantoprazole sodium  40 mg Per Tube Q1200  . sodium chloride flush  10-40 mL Intracatheter Q12H  . sodium chloride flush  3 mL Intravenous Q12H  . vecuronium  10 mg Intravenous Once      Otelia Santee, MD 05/22/2019, 1:10 PM

## 2019-05-22 NOTE — Progress Notes (Signed)
NAME:  Carlos Brewer., MRN:  [id number], DOB:  03-May-1985, LOS: 13 ADMISSION DATE:  05/02/2019, CONSULTATION DATE:  04/18/2019 REFERRING MD:  Dr. Leonel Ramsay, CHIEF COMPLAINT:  Slurred speech  Brief History   34 yo male smoker found to have slurred speech and Rt sided weakness.  Admitted with left MCA CVA and multiple smaller embolic infarcts s/p tPA and thrombectomy in IR.  Additionally found to have left ventricular  thrombus.    Course complicated by septic shock r/t HCAP, AKI requiring CRRT, polymorphic VT and cardiogenic shock.  Intermittent pressor dependence, required tracheostomy  Past Medical History  Systolic CHF with non ischemic CM, Cocaine abuse, OSA, DM Hx of CHF (EF 15%)  Hx myocarditis Smoker 1/2 ppd   Significant Hospital Events   2/05 Admit, tPA, IR thrombectomy 2/6 100% on fvent at 2300  Overnight requiring increasing levophed and phenylephrine.    2/6: switched to levophed, epi, started antibiotics, started on CRRT.  2/9  developed wide-complex tachycardia and hypotension, started on amiodarone drip, increase Levophed drip 2/15 drop in platelets, heparin stopped, bival started 2/17 Hypotensive and back on Levophed 2/18 trach, lines changed 2/19 started milrinone , fever 103 2/20 Episode of wide-complex tachycardia yesterday, changed from Levophed to vasopressin 2/22 stopping vanc. Still on inotrope support. Some AF w/ RVR. Had to be placed back on pressors. ivabradine added 2/23 still on pressors/ CRRT continued 2/24 tmax 98.4, Remains on CRRT, even UF, remains anuric, Levophed at 14 mcg/min, Milrinone at 0.125 mcg/kg/min, Coox 63.4 Doing well this morning on ATC, no events overnight.  Midodrine added   2/25 more interactive, remains on ATC, tmax 99.5/ WBC 21.4, ~900 ml liquid stool/ 24 hours, neg Cdiff, Levophed up to 20 mcg/min, CVP 2, ,milrinone remains at 0.125 mcg/kg/min, coox 92.7, Off CRRT since last night ~2000 s/p clotted x 3 off citrate, restarted    3/1 Amio drip restarted for WCT/atrial flutter, milrinone turned off 3/2 CRRT stopped, permacath placed 3/5 crrt resumed 3/6 started on pressors for crrt tolerance  Consults:  Neuro IR Cardiology  Nephrology Heart Failure  EP   Procedures:  2/5-2/6:  TPA given at 428 am (total of 90 Mg) 520 am went to IR  S/P Lt common carotid arteriogram followed by complete revascularization of occluded LT MCA sup division mid M2 seg with x 1 pass with 38mx 40 mm solitaire X ret river device and penumbra aspiration with TICI 3 revascularization  ETT 2/05 >> 2/18 LIJ CVL 2/5 >>2/18  RT [place] CVL 2/18 >>3/2 RIJ HD cath 2/6 >>2/18 6 shiley cuffed trach 2/18  >>3/4  RT fem HD 2/19 >>3/2 RIJ permacath 3/2>> 3/4 shiley 4 uncuffed.... dislodged placed back 6cuffed shiley that evening 3/5: change to #6 cuffed distal XLT  Significant Diagnostic Tests:  CT angio head/neck 2/05 >> occlusion of Lt MCA bifurcation Echo 2/05 >> EF less than 20%, cannot rule out apical thrombus MRI brain 2/11 > extensive acute infarction of multiple areas without large or medium vessel occlusion Echocardiogram 2/8 Left ventricular ejection fraction, by estimation, is <20%. The left  ventricle has severely decreased function. The left ventricle demonstrates  global hypokinesis. The left ventricular internal cavity size was severely  dilated. Possible small 0.8 x  0.6 cm apical thrombus. Somewhat subtle finding, could confirm with  cardiac MRI. However, if patient has had CVA, would be reasonable to  anticoagulate given severity of LV dysfunction if no contraindications to  anticoagulation.   Micro Data:  SARS CoV2 PCR  2/05 >> negative Influenza PCR 2/05 >> negative Urine 2/6 >> ng resp 2/6 >> nml flora BC 2/6 >>ng resp 2/16 >> ng [facility] 2/19 >> negative  BC x 2 2/25 >>ng Cdiff 2/25 >> neg  Antimicrobials:  mero 2/6 -2/12 Zosyn 2/6 , 2/19 >> 2/23 vanc 2/6 >> 2/9 , 2/19 >>2/22  Interim history/subjective:   3/7: remains on pressors. Awake and following commands this am. On 40% and 10 peep (I turned to 8). Cont to wean for pst vs atc again.  3/6: events noted yesterday evening as well post change to XLT. Maintained on crrt and bp a bit soft this am. Will add low peripheral neo to cont to pull some volume if possible.   3/5: events last pm reviewed, trach was downsized earlier in day to uncuffed shiley4 but pt had desaturations and inability to pass suction catheter, bronch revealed dislodging of trach and pt was intubated from oral and perc trach redone with a 6 shiley cuffed placed. This am pt with grunting and increased wob on atc. Changed back to vent with loss of volumes. Stat cxr stable and emergent bronch eval with good position and no tissue seen overlaping trach.  3/4: Continues on trach collar Afebrile Remains off CRRT Loose stools, with Flexi-Seal   Objective   Blood pressure 100/64, pulse 77, temperature 97.8 F (36.6 C), temperature source Axillary, resp. rate (!) 28, height 6' 1"  (1.854 m), weight 111 kg, SpO2 100 %.    Vent Mode: PCV FiO2 (%):  [40 %-60 %] 40 % Set Rate:  [28 bmp] 28 bmp PEEP:  [10 cmH20] 10 cmH20 Plateau Pressure:  [27 cmH20-29 cmH20] 27 cmH20   Intake/Output Summary (Last 24 hours) at 05/22/2019 0942 Last data filed at 05/22/2019 0076 Gross per 24 hour  Intake 6880.21 ml  Output 6353 ml  Net 527.21 ml   Filed Weights   05/19/19 0500 05/20/19 1000 05/22/19 0500  Weight: 108.1 kg 111.5 kg 111 kg    Examination: Unchanged General: Well-built, well-nourished,on vent appears comfortable HEENT: [place]/AT trach midline, MM pink/moist, mild pallor, no icterus Neuro: awake, able to follow simple commands , moves all 4 extremities, restless CV: s1s2 regular rate and rhythm, no murmur, rubs, or gallops,  PULM:  Clear to ascultation bilaterally GI: soft, bowel sounds active in all 4 quadrants, non-tender, non-distended Extremities: warm/dry, + edema  Skin: no rashes  or lesions  Lab Results  Component Value Date   WBC 6.6 05/22/2019   HGB 7.8 (L) 05/22/2019   HCT 23.0 (L) 05/22/2019   MCV 100.8 (H) 05/22/2019   PLT 222 05/22/2019   Lab Results  Component Value Date   CREATININE 2.40 (H) 05/22/2019   BUN 28 (H) 05/22/2019   NA 138 05/22/2019   K 3.1 (L) 05/22/2019   CL 87 (L) 05/22/2019   CO2 33 (H) 05/22/2019       Resolved Hospital Problem list     HCAP 2/19- 2/23 zosyn/ vanc  Assessment & Plan:   Left MCA CVA Multiple other infarcts noted on MRI 2/26-[id number] embolic Acute metabolic encephalopathy -resolved P: aggressive PT -tolerating Ambien as needed for insomnia  Acute systolic HF with cardiogenic shock in setting of NICM, ejection fraction is less than 20% P: Heart failure team following, appreciate assistance On midodrine pulm edema improving -cxr in am.  -noted HF note re: need for pressors and crrt, concern for if unable to come off pressors for iHD and need for palliation at that time. -  Personally holding on conversations with the mother regarding this until we can determine better, weaning peep which may help as well.     Left ventricle apical thrombus non ischemic cardiomyopathy with systolic congestive heart failure Polymorphic VT on 2/9, WCT 2/19 , 2/28 HLD P: Primary management per cardiology  Cont oral amiodarone  Daily ASA and Lipitor     Acute hypoxemic respiratory failure now tracheostomy dependent after stroke - last on MV 2/21 Plan Continue aggressive pulmonary hygiene  -see event notes from overnight -#6cuffed distal XLT placed 3/5 SLP following , and progress with PMV valve per their recs... weaning vent to hopefully transition to pst vs atc soon.  -cxr in am   Acute kidney injury - CRRT started 2/6 Plan Nephrology following  remains on crrt -unsure how to get pt to [facility] as unable to pull volume as it is on crrt and still req pressors.  -161m uop yesterday Hypokalemia:  -per  renal    HIT without thrombosis but needs anticoagulation for embolic CVA/ LV thrombus - platelets recovered -SRA positive at 90 Plan Avoid heparin Continue Bivalirdin -transition to coumadin eventually once able to consistently stay off vent and take po Normocytic anemia:  -relatively stable hgb -no overt bleeding and no acute indication for transfusion   DM type II with hyperglycemia Plan improved SSI   Hx of cocaine abuse. Depression. - not active per UDS  Plan Continue Prozac    Best practice:  Diet: NPO,  TFs DVT prophylaxis: SCDs/ bivalirudin GI prophylaxis: protonix Mobility: progress when able Code Status: full code Disposition: ICU  Family -mother at bedside 3/7  Critical care time: The patient is critically ill with multiple organ systems failure and requires high complexity decision making for assessment and support, frequent evaluation and titration of therapies, application of advanced monitoring technologies and extensive interpretation of multiple databases.  Critical care time 36 mins. This represents my time independent of the NPs time taking care of the pt. This is excluding procedures.    JKings BeachPulmonary and Critical Care 05/22/2019, 9:42 AM

## 2019-05-22 NOTE — Progress Notes (Signed)
CRRT stopped at this time because filter was clotting.  Not restarted per plan.

## 2019-05-22 NOTE — Progress Notes (Signed)
The patient adamantly shook his head "no" when asked if he wanted to video chat [name]. At this time Mother, POA would like to change the password to [phone]). Per families request, please do not give anyone patient information over the phone unless they have the new password.

## 2019-05-22 NOTE — Progress Notes (Signed)
Patient nodded that he wanted his mother making his medical decisions. I spoke with Elsie Amis and told her that I could not release information unless she had the password.

## 2019-05-23 ENCOUNTER — Inpatient Hospital Stay (HOSPITAL_COMMUNITY): Payer: No Typology Code available for payment source

## 2019-05-23 DIAGNOSIS — J9601 Acute respiratory failure with hypoxia: Secondary | ICD-10-CM | POA: Diagnosis not present

## 2019-05-23 LAB — COOXEMETRY PANEL
Carboxyhemoglobin: 2.4 % — ABNORMAL HIGH (ref 0.5–1.5)
Methemoglobin: 0.5 % (ref 0.0–1.5)
O2 Saturation: 97.6 %
Total hemoglobin: 7.9 g/dL — ABNORMAL LOW (ref 12.0–16.0)

## 2019-05-23 LAB — CBC
HCT: 22.9 % — ABNORMAL LOW (ref 39.0–52.0)
Hemoglobin: 7.2 g/dL — ABNORMAL LOW (ref 13.0–17.0)
MCH: 30.9 pg (ref 26.0–34.0)
MCHC: 31.4 g/dL (ref 30.0–36.0)
MCV: 98.3 fL (ref 80.0–100.0)
Platelets: 247 10*3/uL (ref 150–400)
RBC: 2.33 MIL/uL — ABNORMAL LOW (ref 4.22–5.81)
RDW: 13.8 % (ref 11.5–15.5)
WBC: 5.6 10*3/uL (ref 4.0–10.5)
nRBC: 0 % (ref 0.0–0.2)

## 2019-05-23 LAB — GLUCOSE, CAPILLARY
Glucose-Capillary: 139 mg/dL — ABNORMAL HIGH (ref 70–99)
Glucose-Capillary: 154 mg/dL — ABNORMAL HIGH (ref 70–99)
Glucose-Capillary: 154 mg/dL — ABNORMAL HIGH (ref 70–99)
Glucose-Capillary: 154 mg/dL — ABNORMAL HIGH (ref 70–99)
Glucose-Capillary: 173 mg/dL — ABNORMAL HIGH (ref 70–99)
Glucose-Capillary: 213 mg/dL — ABNORMAL HIGH (ref 70–99)

## 2019-05-23 LAB — MAGNESIUM: Magnesium: 1.6 mg/dL — ABNORMAL LOW (ref 1.7–2.4)

## 2019-05-23 LAB — CULTURE, RESPIRATORY W GRAM STAIN: Culture: NO GROWTH

## 2019-05-23 LAB — RENAL FUNCTION PANEL
Albumin: 2.3 g/dL — ABNORMAL LOW (ref 3.5–5.0)
Anion gap: 16 — ABNORMAL HIGH (ref 5–15)
BUN: 32 mg/dL — ABNORMAL HIGH (ref 6–20)
CO2: 40 mmol/L — ABNORMAL HIGH (ref 22–32)
Calcium: 10 mg/dL (ref 8.9–10.3)
Chloride: 86 mmol/L — ABNORMAL LOW (ref 98–111)
Creatinine, Ser: 3.2 mg/dL — ABNORMAL HIGH (ref 0.61–1.24)
GFR calc Af Amer: 28 mL/min — ABNORMAL LOW (ref >60)
GFR calc non Af Amer: 24 mL/min — ABNORMAL LOW (ref >60)
Glucose, Bld: 159 mg/dL — ABNORMAL HIGH (ref 70–99)
Phosphorus: 2.5 mg/dL (ref 2.5–4.6)
Potassium: 3.2 mmol/L — ABNORMAL LOW (ref 3.5–5.1)
Sodium: 142 mmol/L (ref 135–145)

## 2019-05-23 LAB — APTT
aPTT: 71 seconds — ABNORMAL HIGH (ref 24–36)
aPTT: 63 seconds — ABNORMAL HIGH (ref 24–36)

## 2019-05-23 MED ORDER — POTASSIUM CHLORIDE 20 MEQ/15ML (10%) PO SOLN
40.0000 meq | ORAL | Status: AC
  Administered 2019-05-23 (×2): 40 meq via ORAL
  Filled 2019-05-23 (×2): qty 30

## 2019-05-23 MED ORDER — DARBEPOETIN ALFA 100 MCG/0.5ML IJ SOSY
100.0000 ug | PREFILLED_SYRINGE | INTRAMUSCULAR | Status: DC
  Administered 2019-05-23 – 2019-05-30 (×2): 100 ug via SUBCUTANEOUS
  Filled 2019-05-23 (×2): qty 0.5

## 2019-05-23 NOTE — Progress Notes (Signed)
ANTICOAGULATION CONSULT NOTE  Pharmacy Consult:  Bivalirudin Indication: stroke 2/5, LV thrombus 2/8, HIT 2/15   Allergies  Allergen Reactions  . Heparin Other (See Comments)    Heparin induced thrombocytopenia. 2/15 HIT OD 1.692. 2/16 SRA positive-90.     Patient Measurements: Height: 6\' 1"  (185.4 cm) Weight: 247 lb 5.7 oz (112.2 kg) IBW/kg (Calculated) : 79.9   Vital Signs: Temp: 99.8 F (37.7 C) (03/08 0400) Temp Source: Oral (03/08 0400) BP: 91/50 (03/08 0742) Pulse Rate: 97 (03/08 0742)  Labs: Recent Labs    05/21/19 0500 05/21/19 0621 05/22/19 0447 05/22/19 0605 05/22/19 2013 05/22/19 2013 05/22/19 2019 05/23/19 0522  HGB 8.2*   < > 7.3*   < > 8.5*   < > 7.8* 7.2*  HCT 28.0*   < > 24.2*   < > 25.0*  --  23.0* 22.9*  PLT 241  --  222  --   --   --   --  247  APTT 59*  --  59*  --   --   --   --  71*  CREATININE 3.89*   < > 2.59*   < > 1.90*  --  2.20* 3.20*   < > = values in this interval not displayed.    Estimated Creatinine Clearance: 42.7 mL/min (A) (by C-G formula based on SCr of 3.2 mg/dL (H)).  Assessment: 34 yr old male with left MCA infarct - received TPA and revascularization with IR on 2/5. On 2/8 found to have small LV apical thrombus on ECHO. Pharmacy consulted to dose IV heparin, which was switched to bivalirudin 2/15 for HIT. 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 SRA very positive at 65. Heparin allergy has appropriately been added to chart and updated with SRA results.   APTT is supratherapeutic this morning at 71 at 0.07 mg/kg/hr; no bleeding reported. Supratherapeutic aPTT seems to be occurring when CRRT is discontinued. Hemoglobin trending down 7.8>7.2. Platelet count WNL.   Goal of Therapy:  APTT 50-65 sec  Monitor platelets by anticoagulation protocol: Yes   Plan:  - Decrease bivalirudin infusion to 0.05 mg/kg/hr - Recheck aPTT in two hours - Daily aPTT and CBC - Monitor for s/sx of bleeding - F/u  transition to warfarin  [name], PharmD PGY1 Pharmacy Resident

## 2019-05-23 NOTE — Progress Notes (Signed)
Physical Therapy Treatment Patient Details Name: Carlos Brewer. MRN: [id number] DOB: September 16, 1985 Today's Date: 05/23/2019    History of Present Illness Pt is a 34 y/o male smoker who initially presented on 2/5 with slurred speech and Rt sided weakness. Admitted with left MCA CVA and multiple smaller embolic infarcts s/p tPA and thrombectomy in IR.  Additionally found to have left ventricular  thrombus. Hospital course complicated by septic shock r/t HCAP, AKI requiring CRRT, polymorphic VT and cardiogenic shock. Echo EF < 20%. Pt with Intermittent pressor dependence, required tracheostomy. Tolerating trach collar 3/4, back on vent 3/5 with flash pulmonary edema.    PT Comments    Patient with limited progress as declined over weekend back on vent and on Fentnyl drip so less interactive and more quickly fatigued.  PT to follow along and continue to recommend CIR level rehab at d/c.    Follow Up Recommendations  CIR;Supervision/Assistance - 24 hour     Equipment Recommendations  Other (comment)(TBA)    Recommendations for Other Services       Precautions / Restrictions Precautions Precautions: Fall Precaution Comments: NG    Mobility  Bed Mobility Overal bed mobility: Needs Assistance Bed Mobility: Sidelying to Sit;Rolling Rolling: Mod assist Sidelying to sit: Mod assist;+2 for safety/equipment   Sit to supine: Mod assist;+2 for physical assistance   General bed mobility comments: assist for trunk coming up to sit, pt moving legs off bed on his own, +2 for safety; to supine assist for legs and trunk for safety  Transfers Overall transfer level: Needs assistance Equipment used: 2 person hand held assist Transfers: Sit to/from Stand Sit to Stand: Mod assist;+2 physical assistance;From elevated surface         General transfer comment: stood momentarily after lifted to stand, then sat down  Ambulation/Gait                 Stairs              Wheelchair Mobility    Modified Rankin (Stroke Patients Only) Modified Rankin (Stroke Patients Only) Pre-Morbid Rankin Score: No symptoms Modified Rankin: Severe disability     Balance Overall balance assessment: Needs assistance Sitting-balance support: Feet supported Sitting balance-Leahy Scale: Poor Sitting balance - Comments: moving around restlessly seated EOB and at times using back support or leaning on R elbow; sat EBO about 12 minutes   Standing balance support: Bilateral upper extremity supported Standing balance-Leahy Scale: Poor Standing balance comment: bilat UE support for standing balance momentarily tolerated                            Cognition Arousal/Alertness: Awake/alert Behavior During Therapy: Flat affect Overall Cognitive Status: Impaired/Different from baseline Area of Impairment: Attention;Safety/judgement;Following commands;Problem solving                   Current Attention Level: Focused   Following Commands: Follows one step commands inconsistently;Follows one step commands with increased time Safety/Judgement: Decreased awareness of deficits;Decreased awareness of safety   Problem Solving: Slow processing;Decreased initiation;Requires verbal cues;Requires tactile cues General Comments: lethargic on Fentnyl drip; responding slowly and inconsistently to commands and unable to keep focus      Exercises Other Exercises Other Exercises: performed a few seated LAQ and AAROM for UE ROM at EOB    General Comments General comments (skin integrity, edema, etc.): on vent via trch with FiO2 30% with PEEP of 5  Pertinent Vitals/Pain Pain Assessment: Faces Faces Pain Scale: No hurt    Home Living                      Prior Function            PT Goals (current goals can now be found in the care plan section) Progress towards PT goals: Progressing toward goals(slowly)    Frequency    Min 4X/week       PT Plan Current plan remains appropriate    Co-evaluation PT/OT/SLP Co-Evaluation/Treatment: Yes Reason for Co-Treatment: Complexity of the patient's impairments (multi-system involvement);Necessary to address cognition/behavior during functional activity;For patient/therapist safety;To address functional/ADL transfers PT goals addressed during session: Mobility/safety with mobility;Balance        AM-PAC PT "6 Clicks" Mobility   Outcome Measure  Help needed turning from your back to your side while in a flat bed without using bedrails?: A Lot Help needed moving from lying on your back to sitting on the side of a flat bed without using bedrails?: A Lot Help needed moving to and from a bed to a chair (including a wheelchair)?: A Lot Help needed standing up from a chair using your arms (e.g., wheelchair or bedside chair)?: A Lot Help needed to walk in hospital room?: Total Help needed climbing 3-5 steps with a railing? : Total 6 Click Score: 10    End of Session Equipment Utilized During Treatment: (vent) Activity Tolerance: Patient limited by fatigue Patient left: in bed;with call bell/phone within reach;with bed alarm set   PT Visit Diagnosis: Other abnormalities of gait and mobility (R26.89);Muscle weakness (generalized) (M62.81);Other symptoms and signs involving the nervous system (R29.898)     Time: 4193-7902 PT Time Calculation (min) (ACUTE ONLY): 31 min  Charges:  $Therapeutic Activity: 8-22 mins                     Magda Kiel, [place] [facility] [phone] 05/23/2019    Reginia Naas 05/23/2019, 5:13 PM

## 2019-05-23 NOTE — Progress Notes (Signed)
ANTICOAGULATION CONSULT NOTE  Pharmacy Consult:  Bivalirudin Indication: stroke 2/5, LV thrombus 2/8, HIT 2/15   Allergies  Allergen Reactions  . Heparin Other (See Comments)    Heparin induced thrombocytopenia. 2/15 HIT OD 1.692. 2/16 SRA positive-90.     Patient Measurements: Height: 6\' 1"  (185.4 cm) Weight: 247 lb 5.7 oz (112.2 kg) IBW/kg (Calculated) : 79.9   Vital Signs: Temp: 101.5 F (38.6 C) (03/08 0800) Temp Source: Oral (03/08 0800) BP: 104/64 (03/08 1117) Pulse Rate: 89 (03/08 1117)  Labs: Recent Labs    05/21/19 0500 05/21/19 0621 05/22/19 0447 05/22/19 0605 05/22/19 2013 05/22/19 2013 05/22/19 2019 05/23/19 0522 05/23/19 1028  HGB 8.2*   < > 7.3*   < > 8.5*   < > 7.8* 7.2*  --   HCT 28.0*   < > 24.2*   < > 25.0*  --  23.0* 22.9*  --   PLT 241  --  222  --   --   --   --  247  --   APTT 59*   < > 59*  --   --   --   --  71* 63*  CREATININE 3.89*   < > 2.59*   < > 1.90*  --  2.20* 3.20*  --    < > = values in this interval not displayed.    Estimated Creatinine Clearance: 42.7 mL/min (A) (by C-G formula based on SCr of 3.2 mg/dL (H)).  Assessment: 34 yr old male with left MCA infarct - received TPA and revascularization with IR on 2/5. On 2/8 found to have small LV apical thrombus on ECHO. Pharmacy consulted to dose IV heparin, which was switched to bivalirudin 2/15 for HIT. 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 SRA very positive at 60. Heparin allergy has appropriately been added to chart and updated with SRA results.   APTT is now therapeutic at 64 at 0.05 mg/kg/hr; no bleeding reported. Supratherapeutic aPTT seems to be occurring when CRRT is discontinued. Hemoglobin trending down 7.8>7.2 - nephrology ordered iron stores and starting Aranesp. Platelet count WNL.   Goal of Therapy:  APTT 50-65 sec  Monitor platelets by anticoagulation protocol: Yes   Plan:  - Continue bivalirudin infusion at 0.05 mg/kg/hr -  Daily aPTT and CBC - Monitor for s/sx of bleeding - F/u transition to warfarin  [name], PharmD PGY1 Pharmacy Resident

## 2019-05-23 NOTE — Progress Notes (Signed)
NAME:  Carlos Finelli., MRN:  [id number], DOB:  Aug 13, 1985, LOS: 77 ADMISSION DATE:  04/21/2019, CONSULTATION DATE:  05/15/2019 REFERRING MD:  Dr. Leonel Ramsay, CHIEF COMPLAINT:  Slurred speech  Brief History   34 yo male smoker found to have slurred speech and Rt sided weakness.  Admitted with left MCA CVA and multiple smaller embolic infarcts s/p tPA and thrombectomy in IR.  Additionally found to have left ventricular  thrombus.    Course complicated by septic shock r/t HCAP, AKI requiring CRRT, polymorphic VT and cardiogenic shock.  Intermittent pressor dependence, required tracheostomy  Past Medical History  Systolic CHF with non ischemic CM, Cocaine abuse, OSA, DM Hx of CHF (EF 15%)  Hx myocarditis Smoker 1/2 ppd   Significant Hospital Events   2/05 Admit, tPA, IR thrombectomy 2/6 100% on fvent at 2300  Overnight requiring increasing levophed and phenylephrine.    2/6: switched to levophed, epi, started antibiotics, started on CRRT.  2/9  developed wide-complex tachycardia and hypotension, started on amiodarone drip, increase Levophed drip 2/15 drop in platelets, heparin stopped, bival started 2/17 Hypotensive and back on Levophed 2/18 trach, lines changed 2/19 started milrinone , fever 103 2/20 Episode of wide-complex tachycardia yesterday, changed from Levophed to vasopressin 2/22 stopping vanc. Still on inotrope support. Some AF w/ RVR. Had to be placed back on pressors. ivabradine added 2/23 still on pressors/ CRRT continued 2/24 tmax 98.4, Remains on CRRT, even UF, remains anuric, Levophed at 14 mcg/min, Milrinone at 0.125 mcg/kg/min, Coox 63.4 Doing well this morning on ATC, no events overnight.  Midodrine added   2/25 more interactive, remains on ATC, tmax 99.5/ WBC 21.4, ~900 ml liquid stool/ 24 hours, neg Cdiff, Levophed up to 20 mcg/min, CVP 2, ,milrinone remains at 0.125 mcg/kg/min, coox 92.7, Off CRRT since last night ~2000 s/p clotted x 3 off citrate, restarted   3/1 Amio drip restarted for WCT/atrial flutter, milrinone turned off 3/2 CRRT stopped, permacath placed 3/5 crrt resumed 3/6 started on pressors for crrt tolerance 3/8 No longer on CRRT or pressor support   Consults:  Neuro IR Cardiology  Nephrology Heart Failure  EP   Procedures:  2/5-2/6:  TPA given at 428 am (total of 90 Mg) 520 am went to IR  S/P Lt common carotid arteriogram followed by complete revascularization of occluded LT MCA sup division mid M2 seg with x 1 pass with 25mx 40 mm solitaire X ret river device and penumbra aspiration with TICI 3 revascularization  ETT 2/05 >> 2/18 LIJ CVL 2/5 >>2/18  RT [place] CVL 2/18 >>3/2 RIJ HD cath 2/6 >>2/18 6 shiley cuffed trach 2/18  >>3/4  RT fem HD 2/19 >>3/2 RIJ permacath 3/2>> 3/4 shiley 4 uncuffed.... dislodged placed back 6cuffed shiley that evening 3/5: change to #6 cuffed distal XLT  Significant Diagnostic Tests:  CT angio head/neck 2/05 >> occlusion of Lt MCA bifurcation Echo 2/05 >> EF less than 20%, cannot rule out apical thrombus MRI brain 2/11 > extensive acute infarction of multiple areas without large or medium vessel occlusion Echocardiogram 2/8 Left ventricular ejection fraction, by estimation, is <20%. The left  ventricle has severely decreased function. The left ventricle demonstrates  global hypokinesis. The left ventricular internal cavity size was severely  dilated. Possible small 0.8 x  0.6 cm apical thrombus. Somewhat subtle finding, could confirm with  cardiac MRI. However, if patient has had CVA, would be reasonable to  anticoagulate given severity of LV dysfunction if no contraindications to  anticoagulation.  Micro Data:  SARS CoV2 PCR 2/05 >> negative Influenza PCR 2/05 >> negative Urine 2/6 >> ng resp 2/6 >> nml flora BC 2/6 >>ng resp 2/16 >> ng [facility] 2/19 >> negative  BC x 2 2/25 >>ng Cdiff 2/25 >> neg  Antimicrobials:  mero 2/6 -2/12 Zosyn 2/6 , 2/19 >> 2/23 vanc 2/6 >> 2/9 , 2/19  >>2/22  Interim history/subjective:  Sitting up in bed in no acute distress, remains on vent this AM through trach. No acute events overnight.   Objective   Blood pressure (!) 91/50, pulse 97, temperature (!) 101.5 F (38.6 C), temperature source Oral, resp. rate (!) 30, height 6' 1" (1.854 m), weight 112.2 kg, SpO2 99 %.    Vent Mode: PCV FiO2 (%):  [40 %] 40 % Set Rate:  [28 bmp] 28 bmp PEEP:  [5 cmH20-8 cmH20] 5 cmH20 Plateau Pressure:  [18 cmH20-24 cmH20] 18 cmH20   Intake/Output Summary (Last 24 hours) at 05/23/2019 2297 Last data filed at 05/23/2019 0800 Gross per 24 hour  Intake 3145.77 ml  Output 3226 ml  Net -80.23 ml   Filed Weights   05/20/19 1000 05/22/19 0500 05/23/19 0500  Weight: 111.5 kg 111 kg 112.2 kg    Examination: General: Adult male deconditioned lying in bed in NAD HEENT: [place]/ATTrach midline, MM pink/moist, PERRL,  Neuro: Alert and able to follow simple commands, non-focal  CV: s1s2 regular rate and rhythm, no murmur, rubs, or gallops,  PULM:  Clear to ascultation bilaterally, no increased work of breathing, remains on vent with pressure support  GI: soft, bowel sounds active in all 4 quadrants, non-tender, non-distended, tolerating TF Extremities: warm/dry, no edema  Skin: no rashes or lesions   Lab Results  Component Value Date   WBC 5.6 05/23/2019   HGB 7.2 (L) 05/23/2019   HCT 22.9 (L) 05/23/2019   MCV 98.3 05/23/2019   PLT 247 05/23/2019   Lab Results  Component Value Date   CREATININE 3.20 (H) 05/23/2019   BUN 32 (H) 05/23/2019   NA 142 05/23/2019   K 3.2 (L) 05/23/2019   CL 86 (L) 05/23/2019   CO2 40 (H) 05/23/2019       Resolved Hospital Problem list     HCAP 2/19- 2/23 zosyn/ vanc  Assessment & Plan:   Left MCA CVA Multiple other infarcts noted on MRI 9/89-QJJHER embolic Acute metabolic encephalopathy -resolved Plan: Neurology following Continue with aggressive PT/OT Ambien as needed for insomnia   Acute systolic HF  with cardiogenic shock in setting of NICM, ejection fraction is less than 20% Plan: Heart failure team following On midodrine  CXR with improving pulmonary edema  CRRT stopped overnight as planned, see below  Close monitoring of volume status  Goal to keep even   Left ventricle apical thrombus non ischemic cardiomyopathy with systolic congestive heart failure Polymorphic VT on 2/9, WCT 2/19 , 2/28 HLD Plan: Primary management per cardiology  Continue oral Amiodarone Daily ASA and Lipitor   Acute hypoxemic respiratory failure now tracheostomy dependent after stroke - last on MV 2/21 Plan Continue aggressive pulmonary hygiene  Remains with #6 cuffed distal XLT trach placed 3/5 SLP following  Follow intermittent CXR   Acute kidney injury Hypokalemia  - CRRT started 2/6 stopped again 3/7 Plan Nephrology following  CRRT on hold with plan to transition to [facility] 3/9-3/10 If unable to tolerate iHD goals of care discussion will need to be held with family  Trend Bmet  Supplement K as needed  HIT without thrombosis but needs anticoagulation for embolic CVA/ LV thrombus - platelets recovered -SRA positive at 90 Normocytic anemia  Plan Avoid heparin  Continue Bivalirdin  Plan for transition to coumadin when able to take oral meds  HGB stable  Monitor to signs of bleeding   DM type II with hyperglycemia, improved  Plan: SSI CBG Q4hrs  Hx of cocaine abuse. Depression. - not active per UDS  Plan Continue Prozac    Best practice:  Diet: NPO,  TFs DVT prophylaxis: SCDs/ bivalirudin GI prophylaxis: protonix Mobility: progress when able Code Status: full code Disposition: ICU  Family -mother at bedside 3/7  Critical care time:    Performed by: Johnsie Cancel  Total critical care time: 38 minutes  Critical care time was exclusive of separately billable procedures and treating other patients.  Critical care was necessary to treat or prevent imminent or  life-threatening deterioration.  Critical care was time spent personally by me on the following activities: development of treatment plan with patient and/or surrogate as well as nursing, discussions with consultants, evaluation of patient's response to treatment, examination of patient, obtaining history from patient or surrogate, ordering and performing treatments and interventions, ordering and review of laboratory studies, ordering and review of radiographic studies, pulse oximetry and re-evaluation of patient's condition.  Johnsie Cancel, NP-C [facility] Contact / Pager information can be found on Amion  05/23/2019, 9:25 AM

## 2019-05-23 NOTE — Progress Notes (Signed)
Occupational Therapy Treatment Patient Details Name: Carlos Brewer. MRN: [id number] DOB: 1985/10/27 Today's Date: 05/23/2019    History of present illness Pt is a 34 y/o male smoker who initially presented on 2/5 with slurred speech and Rt sided weakness. Admitted with left MCA CVA and multiple smaller embolic infarcts s/p tPA and thrombectomy in IR.  Additionally found to have left ventricular  thrombus. Hospital course complicated by septic shock r/t HCAP, AKI requiring CRRT, polymorphic VT and cardiogenic shock. Echo EF < 20%. Pt with Intermittent pressor dependence, required tracheostomy. Tolerating trach collar 3/4, back on vent 3/5 with flash pulmonary edema.   OT comments  Pt with recent decline over the weekend and back on vent. Pt with decreased attention this session and quickly distracted; on Fentnyl drip. Pt fatigues quickly. Focused session on sitting balance at EOB, exercises, and activity tolerance. Continue to recommend dc to CIR and will continue to follow acutely as admitted.    Follow Up Recommendations  CIR;Supervision/Assistance - 24 hour    Equipment Recommendations  Other (comment)(TBD)    Recommendations for Other Services      Precautions / Restrictions Precautions Precautions: Fall Precaution Comments: NG Restrictions Weight Bearing Restrictions: No       Mobility Bed Mobility Overal bed mobility: Needs Assistance Bed Mobility: Sidelying to Sit;Rolling;Sit to Supine Rolling: Mod assist Sidelying to sit: Mod assist;+2 for safety/equipment   Sit to supine: Mod assist;+2 for physical assistance Sit to sidelying: Max assist;+2 for safety/equipment;+2 for physical assistance General bed mobility comments: assist for trunk coming up to sit, pt moving legs off bed on his own, +2 for safety; to supine assist for legs and trunk for safety  Transfers Overall transfer level: Needs assistance Equipment used: 2 person hand held assist Transfers: Sit  to/from Stand Sit to Stand: Mod assist;+2 physical assistance;From elevated surface         General transfer comment: Mod A +2 for power up. stood momentarily after lifted to stand, then sat down    Balance Overall balance assessment: Needs assistance Sitting-balance support: Feet supported Sitting balance-Leahy Scale: Poor Sitting balance - Comments: moving around restlessly seated EOB and at times using back support or leaning on R elbow; sat EBO about 12 minutes   Standing balance support: Bilateral upper extremity supported Standing balance-Leahy Scale: Poor Standing balance comment: bilat UE support for standing balance momentarily tolerated                           ADL either performed or assessed with clinical judgement   ADL Overall ADL's : Needs assistance/impaired                                       General ADL Comments: Focused session on sitting balance, activity tolernce, and exercises. Pt tolerating sitting at EOB ~10 minutes but requiring Max A for support      Vision       Perception     Praxis      Cognition Arousal/Alertness: Awake/alert Behavior During Therapy: Flat affect Overall Cognitive Status: Impaired/Different from baseline Area of Impairment: Attention;Safety/judgement;Following commands;Problem solving                   Current Attention Level: Focused   Following Commands: Follows one step commands inconsistently;Follows one step commands with increased time Safety/Judgement: Decreased awareness of deficits;Decreased  awareness of safety   Problem Solving: Slow processing;Decreased initiation;Requires verbal cues;Requires tactile cues General Comments: lethargic on Fentnyl drip; responding slowly and inconsistently to commands and unable to keep focus        Exercises Exercises: General Upper Extremity;General Lower Extremity;Other exercises General Exercises - Upper Extremity Shoulder Flexion:  PROM;AAROM;Both;5 reps;Seated(Poor sustaining of attention to complete [facility]) General Exercises - Lower Extremity Long Arc Quad: AROM;Both;5 reps Other Exercises Other Exercises: Sitting balance and weight shifting   Shoulder Instructions       General Comments on vent via trch with FiO2 30% with PEEP of 5    Pertinent Vitals/ Pain       Pain Assessment: Faces Faces Pain Scale: No hurt Pain Intervention(s): Monitored during session  Home Living                                          Prior Functioning/Environment              Frequency  Min 2X/week(will benefit from 3x)        Progress Toward Goals  OT Goals(current goals can now be found in the care plan section)  Progress towards OT goals: Progressing toward goals  Acute Rehab OT Goals Patient Stated Goal: to d/c home with mother after rehab OT Goal Formulation: With patient Time For Goal Achievement: 06/01/19 Potential to Achieve Goals: Good ADL Goals Pt Will Perform Grooming: with modified independence;sitting Pt Will Perform Upper Body Dressing: sitting;with supervision Pt/caregiver will Perform Home Exercise Program: Increased strength;Increased ROM;Both right and left upper extremity;With Supervision;With written HEP provided Additional ADL Goal #1: Pt will maintain sitting balance EOB >10 min at supervision level in prep for ADL. Additional ADL Goal #2: Pt will follow multi step commands with 75% accuracy during functional task completion. Additional ADL Goal #3: Pt will perform bed mobility with minguard assist as precursor to EOB/OOB ADL.  Plan Discharge plan remains appropriate    Co-evaluation    PT/OT/SLP Co-Evaluation/Treatment: Yes Reason for Co-Treatment: For patient/therapist safety;To address functional/ADL transfers;Complexity of the patient's impairments (multi-system involvement) PT goals addressed during session: Mobility/safety with mobility;Balance OT goals addressed  during session: ADL's and self-care      AM-PAC OT "6 Clicks" Daily Activity     Outcome Measure   Help from another person eating meals?: Total(NPO) Help from another person taking care of personal grooming?: A Lot Help from another person toileting, which includes using toliet, bedpan, or urinal?: Total Help from another person bathing (including washing, rinsing, drying)?: A Lot Help from another person to put on and taking off regular upper body clothing?: A Lot Help from another person to put on and taking off regular lower body clothing?: Total 6 Click Score: 9    End of Session Equipment Utilized During Treatment: Oxygen  OT Visit Diagnosis: Muscle weakness (generalized) (M62.81);Other abnormalities of gait and mobility (R26.89)   Activity Tolerance Patient tolerated treatment well   Patient Left in bed;with call bell/phone within reach;with bed alarm set   Nurse Communication Mobility status        Time: 1308-6578 OT Time Calculation (min): 30 min  Charges: OT General Charges $OT Visit: 1 Visit OT Treatments $Self Care/Home Management : 8-22 mins  [place], OTR/L Acute Rehab Pager: [phone] Office: [place] 05/23/2019, 6:13 PM

## 2019-05-23 NOTE — Progress Notes (Signed)
[facility] Progress Note    Assessment/ Plan:   1. AKIpresumably due to ATN with ischemic/nephrotoxic insults worsened by cardiogenic shock. Started on CRRT 04/23/19. Off and on but stopped last night (3/7) again.  1. Has had clotting issues  On CRRT when off citrate even on angiomax. Now off CRRT.  Goal is to challenge with iHD eventually,  thinking tomorrow or Wed based on HD need, -> but now with inc UOP and no HD need,  we will see 2. Cardiogenic shock- Hx milrinone due to development of a fib with rvr and wide complex tachycardia. -Known severe NICM with EF <20% andmay not tolerate  IHD but also can't continue CRRT indefinitely. - If he doesn't tolerate iHD will need meeting with mother to define goals of care.  3. Leukocytosis - resolved 4. Acute stroke- presumed embolic with bilateral infarcts and LV thrombus- received tpa on admission 5. SVT/polymorphic VT- per Cardiology 6. Acute hypoxic respiratory failure- s/p trach  7. 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 HIT Ab+on Argatroban. 8. HTN/vol-  BP low-  Does not appear to have volume on board- on max dose midodrine  9. Anemia-  Will check iron stores and give ESA  10. Bones-  Phos ok right now-  Just off of CRRT-  Check intact PTH 11. Disposition- overall prognosis is poor with ongoing RRT (has not tolerated stopping CVVHD due to hypercatabolic state and concern for poor tolerance of intermittent HD), trach, as well as cardiogenic shock/cardiomyopathy.  Subjective:   980 UOP - which is more- no pressors but BP is soft.  CRRT stopped last night at 8:45 PM   Objective:   BP (!) 91/50   Pulse 97   Temp (!) 101.5 F (38.6 C) (Oral)   Resp (!) 30   Ht 6' 1" (1.854 m)   Wt 112.2 kg   SpO2 99%   BMI 32.63 kg/m   Intake/Output Summary (Last 24 hours) at 05/23/2019 1014 Last data filed at 05/23/2019 0930 Gross per 24 hour  Intake 2630.13 ml  Output 2893 ml  Net -262.87 ml   Weight change: 1.2  kg  Physical Exam: Gen: awake in bed, trach collar, nonverbal. Follows commands but distracted  HEENT - trach in place CVS: S1S2 no rub Resp:reduced breath sounds Abd: soft/ND Ext: he has no edema Access: rightIJtunneled catheter  Imaging: DG CHEST PORT 1 VIEW  Result Date: 05/23/2019 CLINICAL DATA:  Code stroke. EXAM: PORTABLE CHEST 1 VIEW COMPARISON:  05/21/2019 and older exams. FINDINGS: Mild enlargement of the cardiac silhouette. Improved lung aeration with a decrease in lung opacities, particularly when compared to 05/20/2019. Residual opacities are mostly in the lung bases. No new lung abnormalities. No pneumothorax. Tracheostomy tube and right internal jugular dual-lumen tunneled central venous catheter are stable and well positioned. Enteric tube passes below the diaphragm and below the included field of view. IMPRESSION: 1. Improved lung aeration with decreased airspace lung opacities, particularly compared to 05/20/2019. Given the relatively rapid improvement, the findings support improved pulmonary edema. 2. No new lung abnormalities. 3. Stable support apparatus. Electronically Signed   By: Lajean Manes M.D.   On: 05/23/2019 06:05    Labs: [employer] Recent Labs  Lab 05/19/19 1627 05/19/19 1627 05/20/19 0612 05/20/19 1203 05/20/19 1627 05/20/19 1718 05/21/19 0500 05/21/19 7782 05/22/19 0447 05/22/19 0605 05/22/19 1642 05/22/19 1642 05/22/19 1649 05/22/19 1650 05/22/19 1816 05/22/19 1822 05/22/19 2013 05/22/19 2019 05/23/19 0522  NA 139   < > 140   < >  138   < > 138   < > 139   < > 142   < > 139 141 141 139 140 139 142  K 3.6   < > 3.3*   < > 3.8   < > 4.3   < > 3.3*   < > 3.5   < > 3.5 3.3* 3.2* 3.2* 3.2* 3.1* 3.2*  CL 99   < > 102   < > 100   < > 94*   < > 88*   < > 85*   < > 86* 84* 82* 83* 81* 83* 86*  CO2 21*  --  23  --  24  --  30  --  33*  --  39*  --   --   --   --   --   --   --  40*  GLUCOSE 167*   < > 184*   < > 240*   < > 295*   < > 266*   < > 165*    < > 167* 223* 263* 211* 288* 233* 159*  BUN 95*   < > 104*   < > 77*   < > 45*   < > 31*   < > 25*   < > 26* 22* 23* 24* 20 23* 32*  CREATININE 6.20*   < > 7.14*   < > 5.40*   < > 3.89*   < > 2.59*   < > 2.38*   < > 2.30* 2.00* 1.90* 2.30* 1.90* 2.20* 3.20*  CALCIUM 9.0  --  8.9  --  8.0*  --  8.2*  --  9.8  --  10.7*  --   --   --   --   --   --   --  10.0  PHOS 5.3*  --  6.5*  --  4.0  --  3.8  --  2.4*  --  3.0  --   --   --   --   --   --   --  2.5   < > = values in this interval not displayed.   CBC Recent Labs  Lab 05/20/19 0612 05/20/19 1203 05/21/19 0500 05/21/19 0621 05/22/19 0447 05/22/19 0605 05/22/19 1822 05/22/19 2013 05/22/19 2019 05/23/19 0522  WBC 8.4  --  11.0*  --  6.6  --   --   --   --  5.6  HGB 7.9*   < > 8.2*   < > 7.3*   < > 8.5* 8.5* 7.8* 7.2*  HCT 25.1*   < > 28.0*   < > 24.2*   < > 25.0* 25.0* 23.0* 22.9*  MCV 97.7  --  101.4*  --  100.8*  --   --   --   --  98.3  PLT 213  --  241  --  222  --   --   --   --  247   < > = values in this interval not displayed.    Medications:    . amiodarone  200 mg Per Tube BID  . aspirin  81 mg Per Tube Daily  . atorvastatin  40 mg Per Tube q1800  . chlorhexidine gluconate (MEDLINE KIT)  15 mL Mouth Rinse BID  . Chlorhexidine Gluconate Cloth  6 each Topical Q0600  . feeding supplement (PRO-STAT SUGAR FREE 64)  60 mL Per Tube BID  . FLUoxetine  20 mg Per Tube Daily  .  insulin aspart  0-20 Units Subcutaneous Q4H  . insulin aspart  5 Units Subcutaneous Q4H  . insulin detemir  25 Units Subcutaneous BID  . mouth rinse  15 mL Mouth Rinse 10 times per day  . midodrine  15 mg Per Tube TID WC  . pantoprazole sodium  40 mg Per Tube Q1200  . potassium chloride  40 mEq Oral Q4H  . sodium chloride flush  10-40 mL Intracatheter Q12H  . sodium chloride flush  3 mL Intravenous Q12H  . vecuronium  10 mg Intravenous Once       A   05/23/2019, 10:14 AM

## 2019-05-23 NOTE — Progress Notes (Signed)
SLP Cancellation Note  Patient Details Name: Carlos Brewer. MRN: [id number] DOB: 10-31-1985   Cancelled treatment:       Reason Eval/Treat Not Completed: Medical issues which prohibited therapy. Pt on the vent this morning but per RN, he may transition to TC later today. Will f/u once back on TC.    Osie Bond., M.A. [place] [facility] Pager [phone] Office [phone]  05/23/2019, 9:35 AM

## 2019-05-24 DIAGNOSIS — R570 Cardiogenic shock: Secondary | ICD-10-CM | POA: Diagnosis not present

## 2019-05-24 DIAGNOSIS — J9602 Acute respiratory failure with hypercapnia: Secondary | ICD-10-CM | POA: Diagnosis not present

## 2019-05-24 DIAGNOSIS — N186 End stage renal disease: Secondary | ICD-10-CM

## 2019-05-24 DIAGNOSIS — J9601 Acute respiratory failure with hypoxia: Secondary | ICD-10-CM | POA: Diagnosis not present

## 2019-05-24 DIAGNOSIS — I5023 Acute on chronic systolic (congestive) heart failure: Secondary | ICD-10-CM | POA: Diagnosis not present

## 2019-05-24 DIAGNOSIS — N179 Acute kidney failure, unspecified: Secondary | ICD-10-CM | POA: Diagnosis not present

## 2019-05-24 LAB — MAGNESIUM: Magnesium: 1.9 mg/dL (ref 1.7–2.4)

## 2019-05-24 LAB — RENAL FUNCTION PANEL
Albumin: 2.5 g/dL — ABNORMAL LOW (ref 3.5–5.0)
Albumin: 2.3 g/dL — ABNORMAL LOW (ref 3.5–5.0)
Anion gap: 17 — ABNORMAL HIGH (ref 5–15)
Anion gap: 16 — ABNORMAL HIGH (ref 5–15)
BUN: 60 mg/dL — ABNORMAL HIGH (ref 6–20)
BUN: 72 mg/dL — ABNORMAL HIGH (ref 6–20)
CO2: 37 mmol/L — ABNORMAL HIGH (ref 22–32)
CO2: 35 mmol/L — ABNORMAL HIGH (ref 22–32)
Calcium: 9.8 mg/dL (ref 8.9–10.3)
Calcium: 9.2 mg/dL (ref 8.9–10.3)
Chloride: 91 mmol/L — ABNORMAL LOW (ref 98–111)
Chloride: 93 mmol/L — ABNORMAL LOW (ref 98–111)
Creatinine, Ser: 5.34 mg/dL — ABNORMAL HIGH (ref 0.61–1.24)
Creatinine, Ser: 5.78 mg/dL — ABNORMAL HIGH (ref 0.61–1.24)
GFR calc Af Amer: 16 mL/min — ABNORMAL LOW (ref >60)
GFR calc Af Amer: 14 mL/min — ABNORMAL LOW (ref >60)
GFR calc non Af Amer: 14 mL/min — ABNORMAL LOW (ref >60)
GFR calc non Af Amer: 12 mL/min — ABNORMAL LOW (ref >60)
Glucose, Bld: 190 mg/dL — ABNORMAL HIGH (ref 70–99)
Glucose, Bld: 187 mg/dL — ABNORMAL HIGH (ref 70–99)
Phosphorus: 3.4 mg/dL (ref 2.5–4.6)
Phosphorus: 4 mg/dL (ref 2.5–4.6)
Potassium: 3.6 mmol/L (ref 3.5–5.1)
Potassium: 3.6 mmol/L (ref 3.5–5.1)
Sodium: 145 mmol/L (ref 135–145)
Sodium: 144 mmol/L (ref 135–145)

## 2019-05-24 LAB — IRON AND TIBC
Iron: 55 ug/dL (ref 45–182)
Saturation Ratios: 14 % — ABNORMAL LOW (ref 17.9–39.5)
TIBC: 400 ug/dL (ref 250–450)
UIBC: 345 ug/dL

## 2019-05-24 LAB — CBC
HCT: 24.9 % — ABNORMAL LOW (ref 39.0–52.0)
Hemoglobin: 7.6 g/dL — ABNORMAL LOW (ref 13.0–17.0)
MCH: 30.8 pg (ref 26.0–34.0)
MCHC: 30.5 g/dL (ref 30.0–36.0)
MCV: 100.8 fL — ABNORMAL HIGH (ref 80.0–100.0)
Platelets: 329 10*3/uL (ref 150–400)
RBC: 2.47 MIL/uL — ABNORMAL LOW (ref 4.22–5.81)
RDW: 14.7 % (ref 11.5–15.5)
WBC: 9.6 10*3/uL (ref 4.0–10.5)
nRBC: 0 % (ref 0.0–0.2)

## 2019-05-24 LAB — GLUCOSE, CAPILLARY
Glucose-Capillary: 149 mg/dL — ABNORMAL HIGH (ref 70–99)
Glucose-Capillary: 159 mg/dL — ABNORMAL HIGH (ref 70–99)
Glucose-Capillary: 175 mg/dL — ABNORMAL HIGH (ref 70–99)
Glucose-Capillary: 198 mg/dL — ABNORMAL HIGH (ref 70–99)
Glucose-Capillary: 220 mg/dL — ABNORMAL HIGH (ref 70–99)
Glucose-Capillary: 221 mg/dL — ABNORMAL HIGH (ref 70–99)

## 2019-05-24 LAB — FERRITIN: Ferritin: 445 ng/mL — ABNORMAL HIGH (ref 24–336)

## 2019-05-24 LAB — APTT: aPTT: 58 seconds — ABNORMAL HIGH (ref 24–36)

## 2019-05-24 LAB — CALCIUM, IONIZED: Calcium, Ionized, Serum: 5.5 mg/dL (ref 4.5–5.6)

## 2019-05-24 MED ORDER — SODIUM CHLORIDE 0.9 % IV SOLN
510.0000 mg | Freq: Once | INTRAVENOUS | Status: AC
  Administered 2019-05-24: 510 mg via INTRAVENOUS
  Filled 2019-05-24: qty 17

## 2019-05-24 MED ORDER — PATIENT'S GUIDE TO USING COUMADIN BOOK
Freq: Once | Status: AC
  Filled 2019-05-24: qty 1

## 2019-05-24 MED ORDER — CHLORHEXIDINE GLUCONATE CLOTH 2 % EX PADS: 6.0000 | MEDICATED_PAD | Freq: Every day | CUTANEOUS | Status: DC

## 2019-05-24 MED ORDER — WARFARIN SODIUM 5 MG PO TABS
5.0000 mg | ORAL_TABLET | Freq: Once | ORAL | Status: AC
  Administered 2019-05-24: 5 mg via ORAL
  Filled 2019-05-24: qty 1

## 2019-05-24 MED ORDER — WARFARIN - PHARMACIST DOSING INPATIENT: Freq: Every day | Status: DC

## 2019-05-24 NOTE — Progress Notes (Signed)
Physical Therapy Treatment Patient Details Name: Carlos Brewer. MRN: [id number] DOB: 1986-03-05 Today's Date: 05/24/2019    History of Present Illness Pt is a 34 y/o male smoker who initially presented on 2/5 with slurred speech and Rt sided weakness. Admitted with left MCA CVA and multiple smaller embolic infarcts s/p tPA and thrombectomy in IR.  Additionally found to have left ventricular  thrombus. Hospital course complicated by septic shock r/t HCAP, AKI requiring CRRT, polymorphic VT and cardiogenic shock. Echo EF < 20%. Pt with Intermittent pressor dependence, required tracheostomy. Tolerating trach collar 3/4, back on vent 3/5 with flash pulmonary edema.    PT Comments    Pt participated well today.  Needed multimodal cues for best results.  Pt wavered with focus, frequently squirming and needing redirection.  Emphasis on transitions, sitting balance, sit to stands in the STEDY,  Sitting balance/tolerance in the STEDY and then sitting in the chair.  Mom present and will supervise while she is here.    Follow Up Recommendations  CIR;Supervision/Assistance - 24 hour     Equipment Recommendations  Other (comment)(TBA)    Recommendations for Other Services       Precautions / Restrictions Precautions Precautions: Fall Precaution Comments: NG    Mobility  Bed Mobility Overal bed mobility: Needs Assistance Bed Mobility: Rolling;Sidelying to Sit Rolling: Mod assist Sidelying to sit: Mod assist;+2 for safety/equipment       General bed mobility comments: up via L elbow.  directional cues with time for processing.  assist forward and to the side then up.  Transfers Overall transfer level: Needs assistance Equipment used: (STEDY) Transfers: Sit to/from Stand Sit to Stand: Mod assist;+2 safety/equipment         General transfer comment: mod of 1 from mildly raised bed.  Stood x4  Ambulation/Gait                 Environmental education officer Rankin (Stroke Patients Only) Modified Rankin (Stroke Patients Only) Pre-Morbid Rankin Score: No symptoms Modified Rankin: Severe disability     Balance Overall balance assessment: Needs assistance Sitting-balance support: Feet supported;Single extremity supported;No upper extremity supported Sitting balance-Leahy Scale: Poor Sitting balance - Comments: pt was able to sit upright and sway around his BOS.  Sometimes he can maintain balance for 10-15 secs, but he must be guarded for safety due to his squirminess and impulsivness.   Standing balance support: Bilateral upper extremity supported Standing balance-Leahy Scale: Poor Standing balance comment: needing external support.  Is able to stand fully upright for 21-30 seconds before sitting down, question if this from loss of focus, fatigue or what.                            Cognition Arousal/Alertness: Awake/alert Behavior During Therapy: Flat affect Overall Cognitive Status: Impaired/Different from baseline                     Current Attention Level: Focused   Following Commands: Follows one step commands with increased time;Follows one step commands inconsistently Safety/Judgement: Decreased awareness of safety;Decreased awareness of deficits   Problem Solving: Requires tactile cues;Requires verbal cues        Exercises Other Exercises Other Exercises: hip/knee warm up ROM with AA flexion and graded resistance extention.    General Comments General comments (skin integrity, edema, etc.): vss  Pertinent Vitals/Pain Pain Assessment: Faces Faces Pain Scale: Hurts little more Pain Location: rectal pain Pain Descriptors / Indicators: Burning;Grimacing(squirming) Pain Intervention(s): Monitored during session    Home Living                      Prior Function            PT Goals (current goals can now be found in the care plan section) Acute Rehab PT  Goals Patient Stated Goal: to d/c home with mother after rehab PT Goal Formulation: With patient/family Time For Goal Achievement: 06/01/19 Potential to Achieve Goals: Good Progress towards PT goals: Progressing toward goals    Frequency    Min 4X/week      PT Plan Current plan remains appropriate    Co-evaluation              AM-PAC PT "6 Clicks" Mobility   Outcome Measure  Help needed turning from your back to your side while in a flat bed without using bedrails?: A Lot Help needed moving from lying on your back to sitting on the side of a flat bed without using bedrails?: A Lot Help needed moving to and from a bed to a chair (including a wheelchair)?: A Lot Help needed standing up from a chair using your arms (e.g., wheelchair or bedside chair)?: A Lot Help needed to walk in hospital room?: Total Help needed climbing 3-5 steps with a railing? : Total 6 Click Score: 10    End of Session   Activity Tolerance: Patient tolerated treatment well;Other (comment)(limited by loose stools) Patient left: in chair;Other (comment);with call bell/phone within reach;with family/visitor present("posey" type alarm belt.) Nurse Communication: Mobility status PT Visit Diagnosis: Other abnormalities of gait and mobility (R26.89);Muscle weakness (generalized) (M62.81);Other symptoms and signs involving the nervous system (R29.898)     Time: 0454-0981 PT Time Calculation (min) (ACUTE ONLY): 30 min  Charges:  $Therapeutic Activity: 8-22 mins $Neuromuscular Re-education: 8-22 mins                     05/24/2019  Ginger Carne., PT Acute Rehabilitation Services [phone]  (pager) [phone]  (office)   Tessie Fass Darryl Willner 05/24/2019, 12:58 PM

## 2019-05-24 NOTE — Progress Notes (Signed)
[facility] Progress Note    Assessment/ Plan:   1. AKIpresumably due to ATN with ischemic/nephrotoxic insults worsened by cardiogenic shock. Started on CRRT 04/23/19. Off and on but stopped on 3/7 again.  1. Has had clotting issues  On CRRT when off citrate even on angiomax. Now off CRRT.  Goal is to challenge with iHD eventually,  thinking tomorrow since BUN and crt cont to rise 2. Cardiogenic shock- Hx milrinone due to development of a fib with rvr and wide complex tachycardia. -Known severe NICM with EF <20% andmay not tolerate  IHD but also can't continue CRRT indefinitely. - If he doesn't tolerate iHD will need meeting with mother to define goals of care.  3. Leukocytosis - resolved 4. Acute stroke- presumed embolic with bilateral infarcts and LV thrombus- received tpa on admission 5. SVT/polymorphic VT- per Cardiology 6. Acute hypoxic respiratory failure- s/p trach  7. 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 HIT Ab+on Argatroban. 8. HTN/vol-  BP low-  Does not appear to have volume on board- on max dose midodrine  9. Anemia-   iron stores low, will replete   giving ESA  10. Bones-  Phos ok right now-  Just off of CRRT-  Check intact PTH 11. Disposition- overall prognosis is poor with ongoing RRT (has not tolerated stopping CVVHD due to hypercatabolic state and concern for poor tolerance of intermittent HD), trach, as well as cardiogenic shock/cardiomyopathy.  Subjective:   826 UOP - BUN and crt worse -  Alert but difficult to tell how much information he is taking in    Objective:   BP (!) 118/105   Pulse (!) 112   Temp 98.5 F (36.9 C) (Axillary)   Resp (!) 30   Ht 6' 1"  (1.854 m)   Wt 110.2 kg   SpO2 100%   BMI 32.05 kg/m   Intake/Output Summary (Last 24 hours) at 05/24/2019 1050 Last data filed at 05/24/2019 8937 Gross per 24 hour  Intake 2427.07 ml  Output 1026 ml  Net 1401.07 ml   Weight change: -2 kg  Physical Exam: Gen: awake in  bed, trach collar, nonverbal. Follows commands but distracted  HEENT - trach in place CVS: S1S2 no rub Resp:reduced breath sounds Abd: soft/ND Ext: he has no edema Access: rightIJtunneled catheter  Imaging: DG CHEST PORT 1 VIEW  Result Date: 05/23/2019 CLINICAL DATA:  Code stroke. EXAM: PORTABLE CHEST 1 VIEW COMPARISON:  05/21/2019 and older exams. FINDINGS: Mild enlargement of the cardiac silhouette. Improved lung aeration with a decrease in lung opacities, particularly when compared to 05/20/2019. Residual opacities are mostly in the lung bases. No new lung abnormalities. No pneumothorax. Tracheostomy tube and right internal jugular dual-lumen tunneled central venous catheter are stable and well positioned. Enteric tube passes below the diaphragm and below the included field of view. IMPRESSION: 1. Improved lung aeration with decreased airspace lung opacities, particularly compared to 05/20/2019. Given the relatively rapid improvement, the findings support improved pulmonary edema. 2. No new lung abnormalities. 3. Stable support apparatus. Electronically Signed   By: Lajean Manes M.D.   On: 05/23/2019 06:05    Labs: [facility] Recent Labs  Lab 05/20/19 0612 05/20/19 1203 05/20/19 1627 05/20/19 1718 05/21/19 0500 05/21/19 3428 05/22/19 0447 05/22/19 7681 05/22/19 1642 05/22/19 1649 05/22/19 1650 05/22/19 1816 05/22/19 1822 05/22/19 2013 05/22/19 2019 05/23/19 0522 05/24/19 0520  NA 140   < > 138   < > 138   < > 139   < >  142   < > 141 141 139 140 139 142 145  K 3.3*   < > 3.8   < > 4.3   < > 3.3*   < > 3.5   < > 3.3* 3.2* 3.2* 3.2* 3.1* 3.2* 3.6  CL 102   < > 100   < > 94*   < > 88*   < > 85*   < > 84* 82* 83* 81* 83* 86* 91*  CO2 23  --  24  --  30  --  33*  --  39*  --   --   --   --   --   --  40* 37*  GLUCOSE 184*   < > 240*   < > 295*   < > 266*   < > 165*   < > 223* 263* 211* 288* 233* 159* 190*  BUN 104*   < > 77*   < > 45*   < > 31*   < > 25*   < > 22* 23* 24* 20 23*  32* 60*  CREATININE 7.14*   < > 5.40*   < > 3.89*   < > 2.59*   < > 2.38*   < > 2.00* 1.90* 2.30* 1.90* 2.20* 3.20* 5.34*  CALCIUM 8.9  --  8.0*  --  8.2*  --  9.8  --  10.7*  --   --   --   --   --   --  10.0 9.8  PHOS 6.5*  --  4.0  --  3.8  --  2.4*  --  3.0  --   --   --   --   --   --  2.5 3.4   < > = values in this interval not displayed.   CBC Recent Labs  Lab 05/21/19 0500 05/21/19 0621 05/22/19 0447 05/22/19 0605 05/22/19 2013 05/22/19 2019 05/23/19 0522 05/24/19 0920  WBC 11.0*  --  6.6  --   --   --  5.6 9.6  HGB 8.2*   < > 7.3*   < > 8.5* 7.8* 7.2* 7.6*  HCT 28.0*   < > 24.2*   < > 25.0* 23.0* 22.9* 24.9*  MCV 101.4*  --  100.8*  --   --   --  98.3 100.8*  PLT 241  --  222  --   --   --  247 329   < > = values in this interval not displayed.    Medications:    . amiodarone  200 mg Per Tube BID  . aspirin  81 mg Per Tube Daily  . atorvastatin  40 mg Per Tube q1800  . chlorhexidine gluconate (MEDLINE KIT)  15 mL Mouth Rinse BID  . Chlorhexidine Gluconate Cloth  6 each Topical Q0600  . darbepoetin (ARANESP) injection - NON-DIALYSIS  100 mcg Subcutaneous Q Mon-1800  . feeding supplement (PRO-STAT SUGAR FREE 64)  60 mL Per Tube BID  . FLUoxetine  20 mg Per Tube Daily  . insulin aspart  0-20 Units Subcutaneous Q4H  . insulin aspart  5 Units Subcutaneous Q4H  . insulin detemir  25 Units Subcutaneous BID  . mouth rinse  15 mL Mouth Rinse 10 times per day  . midodrine  15 mg Per Tube TID WC  . pantoprazole sodium  40 mg Per Tube Q1200  . sodium chloride flush  10-40 mL Intracatheter Q12H      Louis Meckel  05/24/2019, 10:50 AM

## 2019-05-24 NOTE — Progress Notes (Signed)
Nutrition Follow-up  DOCUMENTATION CODES:   Obesity unspecified  INTERVENTION:   Continue:  Vital 1.5 @ 60 ml/hr (1440 ml/day) via Cortrak tube 60 ml Prostat BID  Provides: 2560 kcal, 157 grams protein, and 1100 ml free water.    NUTRITION DIAGNOSIS:   Inadequate oral intake related to inability to eat as evidenced by NPO status. Ongoing.   GOAL:   Patient will meet greater than or equal to 90% of their needs Meeting with TF.   MONITOR:   TF tolerance  REASON FOR ASSESSMENT:   Ventilator   ASSESSMENT:   Pt with PMH of CHF, OSA, poorly controlled DM, cocaine abuse admitted with L MCA stroke s/p tPA and IR thrombectomy.   Pt tolerating TC. Pt off pressors.  Renal following, Started on CRRT 2/6 but now off CRRT and making some urine. Plan for possible iHD 3/10.  Pt continues to work with PT.   MV: 12.5 L/min Temp (24hrs), Avg:98.7 F (37.1 C), Min:98.2 F (36.8 C), Max:100 F (37.8 C)   Medications reviewed and include:  5 units novolog every 4 hours, 25 units levemir BID  Labs reviewed: BUN/Cr: 60/5.34 (H) CBGs: 149-159-220   UOP: 826 x 24 hrs  Diet Order:   Diet Order    None      EDUCATION NEEDS:   No education needs have been identified at this time  Skin:  Skin Assessment: Skin Integrity Issues: Skin Integrity Issues:: Stage II, Other (Comment) Stage II: R buttocks, penis Other: open wound buttocks  Last BM:  3/9 medium/type 7  Height:   Ht Readings from Last 1 Encounters:  05/13/2019 6\' 1"  (1.854 m)    Weight:   Wt Readings from Last 1 Encounters:  05/24/19 110.2 kg    Ideal Body Weight:  83.6 kg  BMI:  Body mass index is 32.05 kg/m.  Estimated Nutritional Needs:   Kcal:  2300-2500  Protein:  150-170 grams  Fluid:  2 L/day  Lockie Pares., RD, LDN, CNSC See AMiON for contact information

## 2019-05-24 NOTE — Progress Notes (Signed)
ANTICOAGULATION CONSULT NOTE  Pharmacy Consult:  Bivalirudin + add Coumadin Indication: stroke 2/5, LV thrombus 2/8, HIT 2/15   Allergies  Allergen Reactions  . Heparin Other (See Comments)    Heparin induced thrombocytopenia. 2/15 HIT OD 1.692. 2/16 SRA positive-90.     Patient Measurements: Height: 6\' 1"  (185.4 cm) Weight: 242 lb 15.2 oz (110.2 kg) IBW/kg (Calculated) : 79.9   Vital Signs: Temp: 99.1 F (37.3 C) (03/09 1200) Temp Source: Oral (03/09 1200) BP: 114/76 (03/09 1600) Pulse Rate: 103 (03/09 1600)  Labs: Recent Labs    05/22/19 0447 05/22/19 0605 05/22/19 2019 05/22/19 2019 05/23/19 0522 05/23/19 1028 05/24/19 0520 05/24/19 0920  HGB 7.3*   < > 7.8*   < > 7.2*  --   --  7.6*  HCT 24.2*   < > 23.0*  --  22.9*  --   --  24.9*  PLT 222  --   --   --  247  --   --  329  APTT 59*   < >  --   --  71* 63* 58*  --   CREATININE 2.59*   < > 2.20*  --  3.20*  --  5.34*  --    < > = values in this interval not displayed.    Estimated Creatinine Clearance: 25.4 mL/min (A) (by C-G formula based on SCr of 5.34 mg/dL (H)).  Assessment: 34 yr old male with left MCA infarct - received TPA and revascularization with IR on 2/5. On 2/8 found to have small LV apical thrombus on ECHO. Pharmacy consulted to dose IV heparin, which was switched to bivalirudin 2/15 for HIT. 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 SRA very positive at 50. Heparin allergy has appropriately been added to chart and updated with SRA results.   APTT is therapeutic at 58 at 0.05 mg/kg/hr; no bleeding reported. Supratherapeutic aPTT typically occurs when CRRT is discontinued. Hemoglobin 7.6 nephrology ordered iron stores and started Aranesp. Platelet count 329.  Goal of Therapy:  APTT 50-65 sec  Monitor platelets by anticoagulation protocol: Yes   Plan:  - Continue bivalirudin infusion at 0.05 mg/kg/hr - Daily aPTT and CBC - Monitor for s/sx of bleeding - F/u  transition to warfarin  Carlos Brewer, PharmD PGY1 Pharmacy Resident  Addendum: Pharmacy asked to start Coumadin tonight.Baseline INR 1.5, also on amiodarone.  Will start Coumadin 5 mg x 1 tonight.  Daily INR.  Carlos Brewer, [facility] Clinical Pharmacist Phone [phone]  05/24/2019 4:29 PM

## 2019-05-24 NOTE — Progress Notes (Signed)
Inpatient Rehabilitation-Admissions Coordinator   [facility] continues to follow pt's medical workup. Noted plans to challenge with iHD eventually. If pt can tolerate iHD, he may be a candidate for CIR. Will continue to follow closely and will request consult order if appropriate.   Raechel Ache, OTR/L  Rehab Admissions Coordinator  [phone] 05/24/2019 8:08 AM

## 2019-05-24 NOTE — Progress Notes (Signed)
Advanced Heart Failure Rounding Note   Subjective:    Remains on vent through trach. Failed PS wean. Off CVVHD. Making urine but no clearance. Off inotropes. Volume ok  Objective:   Weight Range:  Vital Signs:   Temp:  [98.2 F (36.8 C)-100.1 F (37.8 C)] 98.5 F (36.9 C) (03/09 0800) Pulse Rate:  [57-152] 112 (03/09 0802) Resp:  [15-32] 30 (03/09 0802) BP: (88-118)/(60-105) 118/105 (03/09 0802) SpO2:  [88 %-100 %] 100 % (03/09 0802) FiO2 (%):  [30 %-50 %] 50 % (03/09 0838) Weight:  [110.2 kg] 110.2 kg (03/09 0500) Last BM Date: 05/20/19  Weight change: Filed Weights   05/22/19 0500 05/23/19 0500 05/24/19 0500  Weight: 111 kg 112.2 kg 110.2 kg    Intake/Output:   Intake/Output Summary (Last 24 hours) at 05/24/2019 1033 Last data filed at 05/24/2019 0921 Gross per 24 hour  Intake 2427.07 ml  Output 1026 ml  Net 1401.07 ml    PHYSICAL EXAM: General:  Awake on vent through trach Restless. Will follow commands HEENT: normal Neck: supple. no JVD. + trach  Carotids 2+ bilat; no bruits. No lymphadenopathy or thryomegaly appreciated. Cor: PMI nondisplaced. Regular tachy No rubs, gallops or murmurs. Lungs: clear Abdomen: obese soft, nontender, nondistended. No hepatosplenomegaly. No bruits or masses. Good bowel sounds. Extremities: no cyanosis, clubbing, rash, edema Neuro: awake on trach follows comamnds    Telemetry: Sinus 100-115 personally reviewed    Labs: Basic Metabolic Panel: Recent Labs  Lab 05/20/19 0612 05/20/19 1203 05/21/19 0500 05/21/19 0621 05/22/19 0447 05/22/19 0605 05/22/19 1642 05/22/19 1649 05/22/19 1822 05/22/19 2013 05/22/19 2019 05/23/19 0522 05/24/19 0520  NA 140   < > 138   < > 139   < > 142   < > 139 140 139 142 145  K 3.3*   < > 4.3   < > 3.3*   < > 3.5   < > 3.2* 3.2* 3.1* 3.2* 3.6  CL 102   < > 94*   < > 88*   < > 85*   < > 83* 81* 83* 86* 91*  CO2 23   < > 30  --  33*  --  39*  --   --   --   --  40* 37*  GLUCOSE 184*    < > 295*   < > 266*   < > 165*   < > 211* 288* 233* 159* 190*  BUN 104*   < > 45*   < > 31*   < > 25*   < > 24* 20 23* 32* 60*  CREATININE 7.14*   < > 3.89*   < > 2.59*   < > 2.38*   < > 2.30* 1.90* 2.20* 3.20* 5.34*  CALCIUM 8.9   < > 8.2*   < > 9.8   < > 10.7*  --   --   --   --  10.0 9.8  MG 1.9  --  1.9  --  1.8  --   --   --   --   --   --  1.6* 1.9  PHOS 6.5*   < > 3.8  --  2.4*  --  3.0  --   --   --   --  2.5 3.4   < > = values in this interval not displayed.    Liver Function Tests: Recent Labs  Lab 05/21/19 0500 05/22/19 0447 05/22/19 1642 05/23/19 0522 05/24/19 9381  ALBUMIN 2.4* 2.2* 2.4* 2.3* 2.5*   No results for input(s): LIPASE, AMYLASE in the last 168 hours. No results for input(s): AMMONIA in the last 168 hours.  CBC: Recent Labs  Lab 05/20/19 0612 05/20/19 1203 05/21/19 0500 05/21/19 0621 05/22/19 0447 05/22/19 0605 05/22/19 1822 05/22/19 2013 05/22/19 2019 05/23/19 0522 05/24/19 0920  WBC 8.4  --  11.0*  --  6.6  --   --   --   --  5.6 9.6  HGB 7.9*   < > 8.2*   < > 7.3*   < > 8.5* 8.5* 7.8* 7.2* 7.6*  HCT 25.1*   < > 28.0*   < > 24.2*   < > 25.0* 25.0* 23.0* 22.9* 24.9*  MCV 97.7  --  101.4*  --  100.8*  --   --   --   --  98.3 100.8*  PLT 213  --  241  --  222  --   --   --   --  247 329   < > = values in this interval not displayed.    Cardiac Enzymes: No results for input(s): CKTOTAL, CKMB, CKMBINDEX, TROPONINI in the last 168 hours.  BNP: BNP (last 3 results) Recent Labs    12/13/18 1750 12/15/18 0405 04/23/19 0945  BNP 1,156.1* 1,161.0* 2,909.9*    ProBNP (last 3 results) No results for input(s): PROBNP in the last 8760 hours.    Other results:  Imaging: DG CHEST PORT 1 VIEW  Result Date: 05/23/2019 CLINICAL DATA:  Code stroke. EXAM: PORTABLE CHEST 1 VIEW COMPARISON:  05/21/2019 and older exams. FINDINGS: Mild enlargement of the cardiac silhouette. Improved lung aeration with a decrease in lung opacities, particularly when  compared to 05/20/2019. Residual opacities are mostly in the lung bases. No new lung abnormalities. No pneumothorax. Tracheostomy tube and right internal jugular dual-lumen tunneled central venous catheter are stable and well positioned. Enteric tube passes below the diaphragm and below the included field of view. IMPRESSION: 1. Improved lung aeration with decreased airspace lung opacities, particularly compared to 05/20/2019. Given the relatively rapid improvement, the findings support improved pulmonary edema. 2. No new lung abnormalities. 3. Stable support apparatus. Electronically Signed   By: Lajean Manes M.D.   On: 05/23/2019 06:05     Medications:     Scheduled Medications: . amiodarone  200 mg Per Tube BID  . aspirin  81 mg Per Tube Daily  . atorvastatin  40 mg Per Tube q1800  . chlorhexidine gluconate (MEDLINE KIT)  15 mL Mouth Rinse BID  . Chlorhexidine Gluconate Cloth  6 each Topical Q0600  . darbepoetin (ARANESP) injection - NON-DIALYSIS  100 mcg Subcutaneous Q Mon-1800  . feeding supplement (PRO-STAT SUGAR FREE 64)  60 mL Per Tube BID  . FLUoxetine  20 mg Per Tube Daily  . insulin aspart  0-20 Units Subcutaneous Q4H  . insulin aspart  5 Units Subcutaneous Q4H  . insulin detemir  25 Units Subcutaneous BID  . mouth rinse  15 mL Mouth Rinse 10 times per day  . midodrine  15 mg Per Tube TID WC  . pantoprazole sodium  40 mg Per Tube Q1200  . sodium chloride flush  10-40 mL Intracatheter Q12H    Infusions: . sodium chloride Stopped (05/04/19 0200)  . sodium chloride Stopped (05/21/19 0940)  . anticoagulant sodium citrate    . bivalirudin (ANGIOMAX) infusion 0.5 mg/mL (Non-ACS indications) 0.05 mg/kg/hr (05/24/19 0800)  . calcium gluconate infusion for CRRT Stopped (05/22/19  2049)  . feeding supplement (VITAL 1.5 CAL) 1,000 mL (05/24/19 1000)  . fentaNYL infusion INTRAVENOUS Stopped (05/23/19 1939)  . phenylephrine (NEO-SYNEPHRINE) Adult infusion Stopped (05/22/19 1349)  .  prismasol B22GK 4/0 Stopped (05/22/19 2049)  . prismasol B22GK 4/0 Stopped (05/22/19 2049)  . sodium chloride    . sodium citrate 2 %/dextrose 2.5% solution 3000 mL Stopped (05/22/19 2049)    PRN Medications: sodium chloride, anticoagulant sodium citrate, bisacodyl, docusate, fentaNYL (SUBLIMAZE) injection, loperamide HCl, sodium chloride, sodium chloride flush, zolpidem   Assessment/Plan:   1. Acute on chronic systolic HF due to NICM-> cardiogenic shock - Echo EF < 20% - Off inotropes. - Recurrent pulmonary edema in the setting of trach change on 3/5.  - Now improved.  - On midodrine for BP support so unable to start HF meds  2. AKI -> ESRD - started on CRRT 04/23/19 - making some urine but poor clearance - per nephrology, goal is to challenge in iHD early this week - if unable to get him to tolerate iHD would be a palliative situation  3. Acute CVA with severe residual deficits due to extensive R cerebellar, midbrain, R MCA and L MCA infarcts  - likely cardioembolic - s/p clot extraction - Working with PT/OT   4. Acute hypoxic respiratory failure - s/p trach  - failed vent wean this am   5. Frequent PVCs/ventricular bigeminy/atrial tachycardia flutter - Rhythm improved on p.o. amio.  - continue ivabradine for tachycarida  6. Apical Thrombus -Remains on bivalirudin. Switch to coumadin when able  We wil follow from a distance. Please call me with questions.   Length of Stay: 68   Glori Bickers  MD 05/24/2019, 10:33 AM  Advanced Heart Failure Team Pager [phone] (M-F; 7a - 4p)  Please contact [facility] for night-coverage after hours (4p -7a ) and weekends on [email or web address]

## 2019-05-24 NOTE — Progress Notes (Signed)
NAME:  Carlos Froh., MRN:  [id number], DOB:  November 25, 1985, LOS: 15 ADMISSION DATE:  05/02/2019, CONSULTATION DATE:  04/21/2019 REFERRING MD:  Dr. Leonel Ramsay, CHIEF COMPLAINT:  Slurred speech  Brief History   34 yo male smoker found to have slurred speech and Rt sided weakness.  Admitted with left MCA CVA and multiple smaller embolic infarcts s/p tPA and thrombectomy in IR.  Additionally found to have left ventricular  thrombus.    Course complicated by septic shock r/t HCAP, AKI requiring CRRT, polymorphic VT and cardiogenic shock.  Intermittent pressor dependence, required tracheostomy  Past Medical History  Systolic CHF with non ischemic CM, Cocaine abuse, OSA, DM Hx of CHF (EF 15%)  Hx myocarditis Smoker 1/2 ppd   Significant Hospital Events   2/05 Admit, tPA, IR thrombectomy 2/6 100% on fvent at 2300  Overnight requiring increasing levophed and phenylephrine.    2/6: switched to levophed, epi, started antibiotics, started on CRRT.  2/9  developed wide-complex tachycardia and hypotension, started on amiodarone drip, increase Levophed drip 2/15 drop in platelets, heparin stopped, bival started 2/17 Hypotensive and back on Levophed 2/18 trach, lines changed 2/19 started milrinone , fever 103 2/20 Episode of wide-complex tachycardia yesterday, changed from Levophed to vasopressin 2/22 stopping vanc. Still on inotrope support. Some AF w/ RVR. Had to be placed back on pressors. ivabradine added 2/23 still on pressors/ CRRT continued 2/24 tmax 98.4, Remains on CRRT, even UF, remains anuric, Levophed at 14 mcg/min, Milrinone at 0.125 mcg/kg/min, Coox 63.4 Doing well this morning on ATC, no events overnight.  Midodrine added   2/25 more interactive, remains on ATC, tmax 99.5/ WBC 21.4, ~900 ml liquid stool/ 24 hours, neg Cdiff, Levophed up to 20 mcg/min, CVP 2, ,milrinone remains at 0.125 mcg/kg/min, coox 92.7, Off CRRT since last night ~2000 s/p clotted x 3 off citrate, restarted    3/1 Amio drip restarted for WCT/atrial flutter, milrinone turned off 3/2 CRRT stopped, permacath placed 3/5 crrt resumed 3/6 started on pressors for crrt tolerance 3/8 No longer on CRRT or pressor support   Consults:  Neuro IR Cardiology  Nephrology Heart Failure  EP   Procedures:  2/5-2/6:  TPA given at 428 am (total of 90 Mg) 520 am went to IR  S/P Lt common carotid arteriogram followed by complete revascularization of occluded LT MCA sup division mid M2 seg with x 1 pass with 78mx 40 mm solitaire X ret river device and penumbra aspiration with TICI 3 revascularization  ETT 2/05 >> 2/18 LIJ CVL 2/5 >>2/18  RT [place] CVL 2/18 >>3/2 RIJ HD cath 2/6 >>2/18 6 shiley cuffed trach 2/18  >>3/4  RT fem HD 2/19 >>3/2 RIJ permacath 3/2>> 3/4 shiley 4 uncuffed.... dislodged placed back 6cuffed shiley that evening 3/5: change to #6 cuffed distal XLT  Significant Diagnostic Tests:  CT angio head/neck 2/05 >> occlusion of Lt MCA bifurcation Echo 2/05 >> EF less than 20%, cannot rule out apical thrombus MRI brain 2/11 > extensive acute infarction of multiple areas without large or medium vessel occlusion Echocardiogram 2/8 Left ventricular ejection fraction, by estimation, is <20%. The left  ventricle has severely decreased function. The left ventricle demonstrates  global hypokinesis. The left ventricular internal cavity size was severely  dilated. Possible small 0.8 x  0.6 cm apical thrombus. Somewhat subtle finding, could confirm with  cardiac MRI. However, if patient has had CVA, would be reasonable to  anticoagulate given severity of LV dysfunction if no contraindications to  anticoagulation.   Micro Data:  SARS CoV2 PCR 2/05 >> negative Influenza PCR 2/05 >> negative Urine 2/6 >> ng resp 2/6 >> nml flora BC 2/6 >>ng resp 2/16 >> ng [facility] 2/19 >> negative  BC x 2 2/25 >>ng Cdiff 2/25 >> neg  Antimicrobials:  mero 2/6 -2/12 Zosyn 2/6 , 2/19 >> 2/23 vanc 2/6 >> 2/9 , 2/19  >>2/22  Interim history/subjective:  Remains on full vent support.  Objective   Blood pressure 100/60, pulse 94, temperature 98.6 F (37 C), temperature source Axillary, resp. rate (!) 28, height 6' 1"  (1.854 m), weight 110.2 kg, SpO2 100 %.    Vent Mode: PCV FiO2 (%):  [30 %-40 %] 30 % Set Rate:  [28 bmp] 28 bmp PEEP:  [5 cmH20] 5 cmH20 Plateau Pressure:  [18 cmH20-24 cmH20] 24 cmH20   Intake/Output Summary (Last 24 hours) at 05/24/2019 0733 Last data filed at 05/24/2019 0600 Gross per 24 hour  Intake 2343.73 ml  Output 826 ml  Net 1517.73 ml   Filed Weights   05/22/19 0500 05/23/19 0500 05/24/19 0500  Weight: 111 kg 112.2 kg 110.2 kg    Examination:  General - alert Eyes - pupils reactive ENT - trach site clean Cardiac - regular rate/rhythm, no murmur Chest - b/l rhonchi Abdomen - soft, non tender, + bowel sounds Extremities - no cyanosis, clubbing, or edema Skin - no rashes Neuro - follows commands    Resolved Hospital Problem list   HCAP 5/88, Acute metabolic encephalopathy 2nd to hypoxia and renal failure, Cardiogenic shock  Assessment & Plan:   Acute hypoxic, hypercapnic respiratory failure with compromised airway in setting of CVA. Failure to wean from vent s/p tracheostomy. - pressure control as rest mode - wean to pressure support/TC as able - #6 XLT trach placed 3/05 - f/u with speech therapy - f/u CXR intermittently - trach care  Acute on chronic systolic CHF, non-ischemic. Polymorphic VT. Apical thrombus in left ventricle. HLD. - goal negative fluid balance - continue amiodarone - ASA, lipitor - cardiology following  Lt MCA CVA likely embolic. Hx of cocaine abuse, depression. - continue PT/OT - continue prozac  AKI from ATN in setting of hypoxia, cardiogenic shock. - transitioned off CRRT 3/07 - f/u BMET - if unable to tolerate iHD, then will need to discuss goals of care with family  HIT. Anemia of critical illness. - heparin induced  Plt Ab 1.692 from 05/02/19 - SRA positive, 90% from 05/03/19 - continue bivalirudin - f/u CBC - transfuse for Hb < 7 or significant bleeding  DM type II poorly controlled with hyperglycemia. - SSI with levemir  Insomnia. - prn ambien  Best practice:  Diet: NPO,  TFs DVT prophylaxis: SCDs/ bivalirudin GI prophylaxis: protonix Mobility: progress when able Code Status: full code Disposition: ICU   Labs:   CMP Latest Ref Rng & Units 05/23/2019 05/22/2019 05/22/2019  Glucose 70 - 99 mg/dL 159(H) 233(H) 288(H)  BUN 6 - 20 mg/dL 32(H) 23(H) 20  Creatinine 0.61 - 1.24 mg/dL 3.20(H) 2.20(H) 1.90(H)  Sodium 135 - 145 mmol/L 142 139 140  Potassium 3.5 - 5.1 mmol/L 3.2(L) 3.1(L) 3.2(L)  Chloride 98 - 111 mmol/L 86(L) 83(L) 81(L)  CO2 22 - 32 mmol/L 40(H) - -  Calcium 8.9 - 10.3 mg/dL 10.0 - -  Total Protein 6.5 - 8.1 g/dL - - -  Total Bilirubin 0.3 - 1.2 mg/dL - - -  Alkaline Phos 38 - 126 U/L - - -  AST 15 -  41 U/L - - -  ALT 0 - 44 U/L - - -    CBC Latest Ref Rng & Units 05/23/2019 05/22/2019 05/22/2019  WBC 4.0 - 10.5 K/uL 5.6 - -  Hemoglobin 13.0 - 17.0 g/dL 7.2(L) 7.8(L) 8.5(L)  Hematocrit 39.0 - 52.0 % 22.9(L) 23.0(L) 25.0(L)  Platelets 150 - 400 K/uL 247 - -    ABG    Component Value Date/Time   PHART 7.335 (L) 05/21/2019 0427   PCO2ART 59.2 (H) 05/21/2019 0427   PO2ART 165.0 (H) 05/21/2019 0427   HCO3 31.5 (H) 05/21/2019 0427   TCO2 43 (H) 05/22/2019 2019   ACIDBASEDEF 3.0 (H) 05/04/2019 2218   O2SAT 97.6 05/23/2019 0343    CBG (last 3)  Recent Labs    05/23/19 1957 05/23/19 2332 05/24/19 [place], MD [place] 05/24/2019, 7:43 AM

## 2019-05-24 NOTE — Progress Notes (Signed)
ANTICOAGULATION CONSULT NOTE  Pharmacy Consult:  Bivalirudin Indication: stroke 2/5, LV thrombus 2/8, HIT 2/15   Allergies  Allergen Reactions  . Heparin Other (See Comments)    Heparin induced thrombocytopenia. 2/15 HIT OD 1.692. 2/16 SRA positive-90.     Patient Measurements: Height: 6\' 1"  (185.4 cm) Weight: 242 lb 15.2 oz (110.2 kg) IBW/kg (Calculated) : 79.9   Vital Signs: Temp: 98.5 F (36.9 C) (03/09 0800) Temp Source: Axillary (03/09 0800) BP: 118/105 (03/09 0802) Pulse Rate: 112 (03/09 0802)  Labs: Recent Labs    05/22/19 0447 05/22/19 0447 05/22/19 0605 05/22/19 2013 05/22/19 2013 05/22/19 2019 05/23/19 0522 05/23/19 1028 05/24/19 0520  HGB 7.3*  --    < > 8.5*   < > 7.8* 7.2*  --   --   HCT 24.2*  --    < > 25.0*  --  23.0* 22.9*  --   --   PLT 222  --   --   --   --   --  247  --   --   APTT 59*   < >  --   --   --   --  71* 63* 58*  CREATININE 2.59*  --    < > 1.90*   < > 2.20* 3.20*  --  5.34*   < > = values in this interval not displayed.    Estimated Creatinine Clearance: 25.4 mL/min (A) (by C-G formula based on SCr of 5.34 mg/dL (H)).  Assessment: 34 yr old male with left MCA infarct - received TPA and revascularization with IR on 2/5. On 2/8 found to have small LV apical thrombus on ECHO. Pharmacy consulted to dose IV heparin, which was switched to bivalirudin 2/15 for HIT. 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 SRA very positive at 2. Heparin allergy has appropriately been added to chart and updated with SRA results.   APTT is therapeutic at 58 at 0.05 mg/kg/hr; no bleeding reported. Supratherapeutic aPTT typically occurs when CRRT is discontinued. Hemoglobin 7.6 nephrology ordered iron stores and started Aranesp. Platelet count 329.  Goal of Therapy:  APTT 50-65 sec  Monitor platelets by anticoagulation protocol: Yes   Plan:  - Continue bivalirudin infusion at 0.05 mg/kg/hr - Daily aPTT and CBC - Monitor  for s/sx of bleeding - F/u transition to warfarin  Carlos Brewer, PharmD PGY1 Pharmacy Resident

## 2019-05-25 ENCOUNTER — Inpatient Hospital Stay (HOSPITAL_COMMUNITY): Payer: No Typology Code available for payment source

## 2019-05-25 DIAGNOSIS — J9601 Acute respiratory failure with hypoxia: Secondary | ICD-10-CM | POA: Diagnosis not present

## 2019-05-25 DIAGNOSIS — N170 Acute kidney failure with tubular necrosis: Secondary | ICD-10-CM | POA: Diagnosis not present

## 2019-05-25 DIAGNOSIS — I639 Cerebral infarction, unspecified: Secondary | ICD-10-CM | POA: Diagnosis not present

## 2019-05-25 LAB — RENAL FUNCTION PANEL
Albumin: 2.4 g/dL — ABNORMAL LOW (ref 3.5–5.0)
Albumin: 2.5 g/dL — ABNORMAL LOW (ref 3.5–5.0)
Anion gap: 16 — ABNORMAL HIGH (ref 5–15)
Anion gap: 15 (ref 5–15)
BUN: 80 mg/dL — ABNORMAL HIGH (ref 6–20)
BUN: 44 mg/dL — ABNORMAL HIGH (ref 6–20)
CO2: 34 mmol/L — ABNORMAL HIGH (ref 22–32)
CO2: 27 mmol/L (ref 22–32)
Calcium: 9.2 mg/dL (ref 8.9–10.3)
Calcium: 8.9 mg/dL (ref 8.9–10.3)
Chloride: 95 mmol/L — ABNORMAL LOW (ref 98–111)
Chloride: 98 mmol/L (ref 98–111)
Creatinine, Ser: 5.9 mg/dL — ABNORMAL HIGH (ref 0.61–1.24)
Creatinine, Ser: 3.93 mg/dL — ABNORMAL HIGH (ref 0.61–1.24)
GFR calc Af Amer: 13 mL/min — ABNORMAL LOW (ref >60)
GFR calc Af Amer: 22 mL/min — ABNORMAL LOW (ref >60)
GFR calc non Af Amer: 11 mL/min — ABNORMAL LOW (ref >60)
GFR calc non Af Amer: 19 mL/min — ABNORMAL LOW (ref >60)
Glucose, Bld: 196 mg/dL — ABNORMAL HIGH (ref 70–99)
Glucose, Bld: 229 mg/dL — ABNORMAL HIGH (ref 70–99)
Phosphorus: 3.7 mg/dL (ref 2.5–4.6)
Phosphorus: 2.8 mg/dL (ref 2.5–4.6)
Potassium: 3.6 mmol/L (ref 3.5–5.1)
Potassium: 4.2 mmol/L (ref 3.5–5.1)
Sodium: 145 mmol/L (ref 135–145)
Sodium: 140 mmol/L (ref 135–145)

## 2019-05-25 LAB — GLUCOSE, CAPILLARY
Glucose-Capillary: 168 mg/dL — ABNORMAL HIGH (ref 70–99)
Glucose-Capillary: 175 mg/dL — ABNORMAL HIGH (ref 70–99)
Glucose-Capillary: 179 mg/dL — ABNORMAL HIGH (ref 70–99)
Glucose-Capillary: 192 mg/dL — ABNORMAL HIGH (ref 70–99)
Glucose-Capillary: 201 mg/dL — ABNORMAL HIGH (ref 70–99)
Glucose-Capillary: 220 mg/dL — ABNORMAL HIGH (ref 70–99)

## 2019-05-25 LAB — PROTIME-INR
INR: 1.4 — ABNORMAL HIGH (ref 0.8–1.2)
Prothrombin Time: 17 seconds — ABNORMAL HIGH (ref 11.4–15.2)

## 2019-05-25 LAB — CBC
HCT: 23.6 % — ABNORMAL LOW (ref 39.0–52.0)
Hemoglobin: 7.3 g/dL — ABNORMAL LOW (ref 13.0–17.0)
MCH: 30.8 pg (ref 26.0–34.0)
MCHC: 30.9 g/dL (ref 30.0–36.0)
MCV: 99.6 fL (ref 80.0–100.0)
Platelets: 315 10*3/uL (ref 150–400)
RBC: 2.37 MIL/uL — ABNORMAL LOW (ref 4.22–5.81)
RDW: 14.9 % (ref 11.5–15.5)
WBC: 8.1 10*3/uL (ref 4.0–10.5)
nRBC: 0 % (ref 0.0–0.2)

## 2019-05-25 LAB — COOXEMETRY PANEL
Carboxyhemoglobin: 2.9 % — ABNORMAL HIGH (ref 0.5–1.5)
Methemoglobin: 1.2 % (ref 0.0–1.5)
O2 Saturation: >100 %
Total hemoglobin: 7.6 g/dL — ABNORMAL LOW (ref 12.0–16.0)

## 2019-05-25 LAB — APTT
aPTT: 47 seconds — ABNORMAL HIGH (ref 24–36)
aPTT: 49 seconds — ABNORMAL HIGH (ref 24–36)

## 2019-05-25 LAB — MAGNESIUM: Magnesium: 1.9 mg/dL (ref 1.7–2.4)

## 2019-05-25 LAB — PARATHYROID HORMONE, INTACT (NO CA): PTH: 70 pg/mL — ABNORMAL HIGH (ref 15–65)

## 2019-05-25 LAB — MRSA PCR SCREENING: MRSA by PCR: POSITIVE — AB

## 2019-05-25 LAB — CALCIUM, IONIZED: Calcium, Ionized, Serum: 5.2 mg/dL (ref 4.5–5.6)

## 2019-05-25 MED ORDER — HEPARIN SODIUM (PORCINE) 1000 UNIT/ML IJ SOLN
INTRAMUSCULAR | Status: AC
  Filled 2019-05-25: qty 4

## 2019-05-25 MED ORDER — ANTICOAGULANT SODIUM CITRATE 4% (200MG/5ML) IV SOLN
5.0000 mL | Freq: Once | Status: AC
  Administered 2019-05-25: 5 mL
  Filled 2019-05-25: qty 5

## 2019-05-25 MED ORDER — WARFARIN SODIUM 5 MG PO TABS
5.0000 mg | ORAL_TABLET | Freq: Once | ORAL | Status: AC
  Administered 2019-05-25: 5 mg via ORAL
  Filled 2019-05-25: qty 1

## 2019-05-25 MED ORDER — CHLORHEXIDINE GLUCONATE CLOTH 2 % EX PADS
6.0000 | MEDICATED_PAD | Freq: Every day | CUTANEOUS | Status: DC
  Administered 2019-05-27 – 2019-05-29 (×3): 6 via TOPICAL

## 2019-05-25 MED ORDER — MUPIROCIN 2 % EX OINT
1.0000 "application " | TOPICAL_OINTMENT | Freq: Two times a day (BID) | CUTANEOUS | Status: AC
  Administered 2019-05-25 – 2019-05-29 (×10): 1 via NASAL
  Filled 2019-05-25 (×2): qty 22

## 2019-05-25 NOTE — Progress Notes (Signed)
[facility] Progress Note    Assessment/ Plan:   1. AKIpresumably due to ATN with ischemic/nephrotoxic insults worsened by cardiogenic shock. Started on CRRT 04/23/19. Off and on but stopped on 3/7 again.  1. Has had clotting issues  On CRRT when off citrate even on angiomax. Now off CRRT.  Goal is to challenge with iHD eventually,  Planning for today  2. Cardiogenic shock- Hx milrinone due to development of a fib with rvr and wide complex tachycardia. -Known severe NICM with EF <20% andmay not tolerate  IHD but also can't continue CRRT indefinitely. - If he doesn't tolerate iHD will need meeting with mother to define goals of care.  3. Leukocytosis - resolved 4. Acute stroke- presumed embolic with bilateral infarcts and LV thrombus- received tpa on admission 5. SVT/polymorphic VT- per Cardiology 6. Acute hypoxic respiratory failure- s/p trach  7. 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 HIT Ab+on Argatroban. 8. HTN/vol-  BP low-  Does not appear to have volume on board- on max dose midodrine  9. Anemia-   iron stores low, will replete- also  giving ESA  10. Bones-  Phos ok right now-  Just off of CRRT-   intact PTH 70 11. Disposition- overall prognosis is poor with ongoing RRT (has not tolerated stopping CVVHD due to hypercatabolic state and concern for poor tolerance of intermittent HD), trach, as well as cardiogenic shock/cardiomyopathy.  Whether or not he tolerates IHD will be a big step  Subjective:   725 UOP - BUN and crt worse again -  Alert but difficult to tell how much information he is taking in.  Plan is for IHD today   Objective:   BP (!) 117/97   Pulse (!) 115   Temp (!) 100.5 F (38.1 C) (Oral)   Resp (!) 22   Ht _0  (1.854 m)   Wt 110.8 kg   SpO2 95%   BMI 32.23 kg/m   Intake/Output Summary (Last 24 hours) at 05/25/2019 3244 Last data filed at 05/25/2019 0102 Gross per 24 hour  Intake 1845.5 ml  Output 585 ml  Net 1260.5 ml    Weight change: 0.6 kg  Physical Exam: Gen: awake in bed, trach collar, nonverbal. Follows commands but distracted  HEENT - trach in place CVS: S1S2 no rub Resp:reduced breath sounds Abd: soft/ND Ext: he has no edema Access: rightIJtunneled catheter  Imaging: DG Chest Port 1 View  Result Date: 05/25/2019 CLINICAL DATA:  Respiratory failure. EXAM: PORTABLE CHEST 1 VIEW COMPARISON:  05/23/2019 FINDINGS: The tracheostomy tube, feeding tube and right IJ dialysis catheters are stable. Stable mild cardiac enlargement and slight worsening pulmonary edema. No large pleural effusions or pneumothorax. IMPRESSION: Slight worsening pulmonary edema. Stable support apparatus. Electronically Signed   By: Marijo Sanes M.D.   On: 05/25/2019 08:33    Labs: BMET Recent Labs  Lab 05/21/19 0500 05/21/19 0621 05/22/19 0447 05/22/19 0605 05/22/19 1642 05/22/19 1649 05/22/19 1822 05/22/19 2013 05/22/19 2019 05/23/19 0522 05/24/19 0520 05/24/19 1830 05/25/19 0133  NA 138   < > 139   < > 142   < > 139 140 139 142 145 144 145  K 4.3   < > 3.3*   < > 3.5   < > 3.2* 3.2* 3.1* 3.2* 3.6 3.6 3.6  CL 94*   < > 88*   < > 85*   < > 83* 81* 83* 86* 91* 93* 95*  CO2 30  --  33*  --  39*  --   --   --   --  40* 37* 35* 34*  GLUCOSE 295*   < > 266*   < > 165*   < > 211* 288* 233* 159* 190* 187* 196*  BUN 45*   < > 31*   < > 25*   < > 24* 20 23* 32* 60* 72* 80*  CREATININE 3.89*   < > 2.59*   < > 2.38*   < > 2.30* 1.90* 2.20* 3.20* 5.34* 5.78* 5.90*  CALCIUM 8.2*  --  9.8  --  10.7*  --   --   --   --  10.0 9.8 9.2 9.2  PHOS 3.8  --  2.4*  --  3.0  --   --   --   --  2.5 3.4 4.0 3.7   < > = values in this interval not displayed.   CBC Recent Labs  Lab 05/22/19 0447 05/22/19 0605 05/22/19 2019 05/23/19 0522 05/24/19 0920 05/25/19 0133  WBC 6.6  --   --  5.6 9.6 8.1  HGB 7.3*   < > 7.8* 7.2* 7.6* 7.3*  HCT 24.2*   < > 23.0* 22.9* 24.9* 23.6*  MCV 100.8*  --   --  98.3 100.8* 99.6  PLT 222   --   --  247 329 315   < > = values in this interval not displayed.    Medications:    . amiodarone  200 mg Per Tube BID  . aspirin  81 mg Per Tube Daily  . atorvastatin  40 mg Per Tube q1800  . chlorhexidine gluconate (MEDLINE KIT)  15 mL Mouth Rinse BID  . Chlorhexidine Gluconate Cloth  6 each Topical Q0600  . Chlorhexidine Gluconate Cloth  6 each Topical Q0600  . darbepoetin (ARANESP) injection - NON-DIALYSIS  100 mcg Subcutaneous Q Mon-1800  . feeding supplement (PRO-STAT SUGAR FREE 64)  60 mL Per Tube BID  . FLUoxetine  20 mg Per Tube Daily  . heparin      . insulin aspart  0-20 Units Subcutaneous Q4H  . insulin aspart  5 Units Subcutaneous Q4H  . insulin detemir  25 Units Subcutaneous BID  . mouth rinse  15 mL Mouth Rinse 10 times per day  . midodrine  15 mg Per Tube TID WC  . pantoprazole sodium  40 mg Per Tube Q1200  . sodium chloride flush  10-40 mL Intracatheter Q12H  . Warfarin - Pharmacist Dosing Inpatient   Does not apply [place]  05/25/2019, 9:03 AM

## 2019-05-25 NOTE — Progress Notes (Signed)
ANTICOAGULATION CONSULT NOTE  Pharmacy Consult:  Bivalirudin + add Coumadin Indication: stroke 2/5, LV thrombus 2/8, HIT 2/15   Allergies  Allergen Reactions  . Heparin Other (See Comments)    Heparin induced thrombocytopenia. 2/15 HIT OD 1.692. 2/16 SRA positive-90.     Patient Measurements: Height: 6\' 1"  (185.4 cm) Weight: 239 lb 6.7 oz (108.6 kg) IBW/kg (Calculated) : 79.9   Vital Signs: Temp: 98.5 F (36.9 C) (03/10 1300) Temp Source: Axillary (03/10 1300) BP: 97/71 (03/10 1300) Pulse Rate: 108 (03/10 1300)  Labs: Recent Labs    05/23/19 0522 05/23/19 0522 05/23/19 1028 05/24/19 0520 05/24/19 0920 05/24/19 1830 05/25/19 0133 05/25/19 1045  HGB 7.2*   < >  --   --  7.6*  --  7.3*  --   HCT 22.9*  --   --   --  24.9*  --  23.6*  --   PLT 247  --   --   --  329  --  315  --   APTT 71*  --    < > 58*  --   --  47* 49*  LABPROT  --   --   --   --   --   --  17.0*  --   INR  --   --   --   --   --   --  1.4*  --   CREATININE 3.20*  --   --  5.34*  --  5.78* 5.90*  --    < > = values in this interval not displayed.    Estimated Creatinine Clearance: 22.8 mL/min (A) (by C-G formula based on SCr of 5.9 mg/dL (H)).  Assessment: 34 yr old male with left MCA infarct - received TPA and revascularization with IR on 2/5. On 2/8 found to have small LV apical thrombus on ECHO. Pharmacy consulted to dose IV heparin, which was switched to bivalirudin 2/15 for HIT. 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 SRA very positive at 76. Heparin allergy has appropriately been added to chart and updated with SRA results.   APTT is subtherapeutic at 49 at 0.05 mg/kg/hr; no bleeding reported. Supratherapeutic aPTT typically occurs when CRRT is discontinued. Hemoglobin is low at 7.3; nephrology has started Aranesp. Platelet count WNL. INR is 1.4.   Goal of Therapy:  APTT 50-65 sec  Monitor platelets by anticoagulation protocol: Yes   Plan:  - Increase  bivalirudin infusion to 0.06 mg/kg/hr - Warfarin 5 mg x1 this evening - Daily aPTT, CBC, INR - Monitor for s/sx of bleeding  [name], PharmD PGY1 Pharmacy Resident

## 2019-05-25 NOTE — Progress Notes (Signed)
Physical Therapy Treatment Patient Details Name: Carlos Brewer. MRN: [id number] DOB: 1985-09-30 Today's Date: 05/25/2019    History of Present Illness Pt is a 34 y/o male smoker who initially presented on 2/5 with slurred speech and Rt sided weakness. Admitted with left MCA CVA and multiple smaller embolic infarcts s/p tPA and thrombectomy in IR.  Additionally found to have left ventricular  thrombus. Hospital course complicated by septic shock r/t HCAP, AKI requiring CRRT, polymorphic VT and cardiogenic shock. Echo EF < 20%. Pt with Intermittent pressor dependence, required tracheostomy. Tolerating trach collar 3/4, back on vent 3/5 with flash pulmonary edema.    PT Comments    Pt again very restless and squirmy.  Pt needed directional cues and frequent redirection to task.  Emphasis on transitions, sitting balance and standing in the STEDY.   Follow Up Recommendations  CIR;Supervision/Assistance - 24 hour     Equipment Recommendations  Other (comment)(TBA)    Recommendations for Other Services       Precautions / Restrictions Precautions Precautions: Fall Precaution Comments: NG    Mobility  Bed Mobility Overal bed mobility: Needs Assistance Bed Mobility: Rolling;Supine to Sit Rolling: Mod assist Sidelying to sit: Mod assist;+2 for safety/equipment       General bed mobility comments: AS last treatment, up via L elbow.  directional cues with time for processing.  assist forward and to the side then up.  Transfers Overall transfer level: Needs assistance Equipment used: (STEDY) Transfers: Sit to/from Stand Sit to Stand: Mod assist;+2 safety/equipment         General transfer comment: mod of 1-2 persons depending on how "squirmy" and unfocused pt is,  from mildly raised bed.  Stood x4  Ambulation/Gait                 Marine scientist Rankin (Stroke Patients Only) Modified Rankin (Stroke Patients  Only) Pre-Morbid Rankin Score: No symptoms Modified Rankin: Severe disability     Balance Overall balance assessment: Needs assistance Sitting-balance support: Feet supported;Single extremity supported;No upper extremity supported Sitting balance-Leahy Scale: Poor Sitting balance - Comments: pt was able to sit upright and sway around his BOS.  Sometimes he can maintain balance for 10-15 secs, but he must be guarded for safety due to his squirminess and impulsivness..  Progressively worsened the longer he sat.  Suspect painful bottom.   Standing balance support: Bilateral upper extremity supported Standing balance-Leahy Scale: Poor Standing balance comment: needing external support.  Is able to stand fully upright for 20-30 seconds before sitting down, question if this from loss of focus, weakness or fatigue.                            Cognition Arousal/Alertness: Awake/alert Behavior During Therapy: Flat affect Overall Cognitive Status: Impaired/Different from baseline                     Current Attention Level: Focused   Following Commands: Follows one step commands with increased time;Follows one step commands inconsistently Safety/Judgement: Decreased awareness of safety;Decreased awareness of deficits   Problem Solving: Requires tactile cues;Requires verbal cues;Slow processing        Exercises Other Exercises Other Exercises: hip/knee warm up ROM with AA flexion and graded resistance extention.    General Comments General comments (skin integrity, edema, etc.): back on pressure support after  a couple of hours weaning.      Pertinent Vitals/Pain Pain Assessment: Faces Faces Pain Scale: Hurts little more Pain Location: rectal pain Pain Descriptors / Indicators: Burning;Grimacing(squirming) Pain Intervention(s): Monitored during session;Repositioned    Home Living                      Prior Function            PT Goals (current goals  can now be found in the care plan section) Acute Rehab PT Goals Patient Stated Goal: to d/c home with mother after rehab PT Goal Formulation: With patient/family Time For Goal Achievement: 06/01/19 Potential to Achieve Goals: Good Progress towards PT goals: Progressing toward goals    Frequency    Min 4X/week      PT Plan Current plan remains appropriate    Co-evaluation PT/OT/SLP Co-Evaluation/Treatment: Yes Reason for Co-Treatment: For patient/therapist safety PT goals addressed during session: Mobility/safety with mobility;Strengthening/ROM OT goals addressed during session: ADL's and self-care;Strengthening/ROM      AM-PAC PT "6 Clicks" Mobility   Outcome Measure  Help needed turning from your back to your side while in a flat bed without using bedrails?: A Lot Help needed moving from lying on your back to sitting on the side of a flat bed without using bedrails?: A Lot Help needed moving to and from a bed to a chair (including a wheelchair)?: A Lot Help needed standing up from a chair using your arms (e.g., wheelchair or bedside chair)?: A Lot Help needed to walk in hospital room?: Total Help needed climbing 3-5 steps with a railing? : Total 6 Click Score: 10    End of Session   Activity Tolerance: Patient tolerated treatment well Patient left: with call bell/phone within reach;in bed;with nursing/sitter in room Nurse Communication: Mobility status PT Visit Diagnosis: Other abnormalities of gait and mobility (R26.89);Muscle weakness (generalized) (M62.81);Other symptoms and signs involving the nervous system (R29.898)     Time: 4650-3546 PT Time Calculation (min) (ACUTE ONLY): 27 min  Charges:  $Therapeutic Activity: 8-22 mins                     05/25/2019  Ginger Carne., PT Acute Rehabilitation Services [phone]  (pager) [phone]  (office)   Tessie Fass Maki Sweetser 05/25/2019, 6:08 PM

## 2019-05-25 NOTE — Progress Notes (Signed)
eLink Physician-Brief Progress Note Patient Name: Carlos Brewer. DOB: 07-31-1985 MRN: [id number]   Date of Service  05/25/2019  HPI/Events of Note  CorTrak pulled out by a significant amount.  eICU Interventions  Will order: 1. Abdominal XRay now to assess CorTrak position.      Intervention Category Major Interventions: Other:  [name] 05/25/2019, 8:54 PM

## 2019-05-25 NOTE — Progress Notes (Signed)
SLP Cancellation Note  Patient Details Name: Carlos Brewer. MRN: [id number] DOB: 1986/01/13   Cancelled treatment:       Reason Eval/Treat Not Completed: Patient not medically ready, still working on weaning from vent.     Osie Bond., M.A. [place] [facility] Pager [phone] Office [phone]  05/25/2019, 2:37 PM

## 2019-05-25 NOTE — Progress Notes (Signed)
eLink Physician-Brief Progress Note Patient Name: Carlos Brewer. DOB: 06/19/1985 MRN: [id number]   Date of Service  05/25/2019  HPI/Events of Note  Fever to 101.7 F - Request for Tylenol. AST and ALT are improving, however, both remain elevated. Therefore, can't use Tylenol. Creatinine = 3.93. Therefore, can't use Motrin.   eICU Interventions  Plan: 1. Ice packs PRN.     Intervention Category Major Interventions: Other:  [name] 05/25/2019, 7:45 PM

## 2019-05-25 NOTE — Progress Notes (Signed)
Occupational Therapy Treatment Patient Details Name: Carlos Brewer. MRN: [id number] DOB: [birth year] Today's Date: 05/25/2019    History of present illness Pt is a 34 y/o male smoker who initially presented on 2/5 with slurred speech and Rt sided weakness. Admitted with left MCA CVA and multiple smaller embolic infarcts s/p tPA and thrombectomy in IR.  Additionally found to have left ventricular  thrombus. Hospital course complicated by septic shock r/t HCAP, AKI requiring CRRT, polymorphic VT and cardiogenic shock. Echo EF < 20%. Pt with Intermittent pressor dependence, required tracheostomy. Tolerating trach collar 3/4, back on vent 3/5 with flash pulmonary edema.   OT comments  Pt making gradual progress towards OT goals. Focus of session on bed mobility, sit<>stand trials via [place] from EOB. Provided pericare via [place] while up in [place] as pt with incontinence. Pt performing multiple sit<>stands, requiring two person assist for standing and fluctuating levels of assist for sitting balance (min-maxA). Pt requiring multimodal cues to follow commands and attend to task. Will continue per POC at this time.    Follow Up Recommendations  CIR;Supervision/Assistance - 24 hour    Equipment Recommendations  Other (comment)(TBD)          Precautions / Restrictions Precautions Precautions: Fall Precaution Comments: NG Restrictions Weight Bearing Restrictions: No       Mobility Bed Mobility Overal bed mobility: Needs Assistance Bed Mobility: Rolling;Supine to Sit Rolling: Mod assist Sidelying to sit: Mod assist;+2 for safety/equipment       General bed mobility comments: AS last treatment, up via L elbow.  directional cues with time for processing.  assist forward and to the side then up.  Transfers Overall transfer level: Needs assistance Equipment used: (STEDY) Transfers: Sit to/from Stand Sit to Stand: Mod assist;+2 safety/equipment         General transfer  comment: mod of 1-2 persons depending on how "squirmy" and unfocused pt is,  from mildly raised bed.  Stood x4    Balance Overall balance assessment: Needs assistance Sitting-balance support: Feet supported;Single extremity supported;No upper extremity supported Sitting balance-Leahy Scale: Poor Sitting balance - Comments: pt was able to sit upright and sway around his BOS.  Sometimes he can maintain balance for 10-15 secs, but he must be guarded for safety due to his squirminess and impulsivness..  Progressively worsened the longer he sat.  Suspect painful bottom.   Standing balance support: Bilateral upper extremity supported Standing balance-Leahy Scale: Poor Standing balance comment: needing external support.  Is able to stand fully upright for 20-30 seconds before sitting down, question if this from loss of focus, weakness or fatigue.                           ADL either performed or assessed with clinical judgement   ADL Overall ADL's : Needs assistance/impaired                     Lower Body Dressing: Maximal assistance;Total assistance;+2 for physical assistance;+2 for safety/equipment;Sit to/from stand Lower Body Dressing Details (indicate cue type and reason): totalA for socks     Toileting- Clothing Manipulation and Hygiene: Total assistance;+2 for physical assistance;+2 for safety/equipment;Sit to/from stand Toileting - Clothing Manipulation Details (indicate cue type and reason): completing pericare while standing in [place]     Functional mobility during ADLs: Moderate assistance;+2 for physical assistance;+2 for safety/equipment(sit<>stand in [place])       Vision       Perception  Praxis      Cognition Arousal/Alertness: Awake/alert Behavior During Therapy: Flat affect Overall Cognitive Status: Impaired/Different from baseline                     Current Attention Level: Focused   Following Commands: Follows one step commands  with increased time;Follows one step commands inconsistently Safety/Judgement: Decreased awareness of safety;Decreased awareness of deficits   Problem Solving: Requires tactile cues;Requires verbal cues;Slow processing          Exercises Other Exercises Other Exercises: hip/knee warm up ROM with AA flexion and graded resistance extention.   Shoulder Instructions       General Comments back on pressure support after a couple of hours weaning.    Pertinent Vitals/ Pain       Pain Assessment: Faces Faces Pain Scale: Hurts little more Pain Location: rectal pain Pain Descriptors / Indicators: Burning;Grimacing(squirming) Pain Intervention(s): Limited activity within patient's tolerance;Monitored during session  Home Living                                          Prior Functioning/Environment              Frequency  Min 2X/week(will benefit from 3x)        Progress Toward Goals  OT Goals(current goals can now be found in the care plan section)  Progress towards OT goals: Progressing toward goals  Acute Rehab OT Goals Patient Stated Goal: to d/c home with mother after rehab OT Goal Formulation: With patient Time For Goal Achievement: 06/01/19 Potential to Achieve Goals: Good  Plan Discharge plan remains appropriate    Co-evaluation    PT/OT/SLP Co-Evaluation/Treatment: Yes Reason for Co-Treatment: For patient/therapist safety;To address functional/ADL transfers PT goals addressed during session: Mobility/safety with mobility;Strengthening/ROM OT goals addressed during session: ADL's and self-care      AM-PAC OT "6 Clicks" Daily Activity     Outcome Measure   Help from another person eating meals?: Total Help from another person taking care of personal grooming?: A Lot Help from another person toileting, which includes using toliet, bedpan, or urinal?: Total Help from another person bathing (including washing, rinsing, drying)?: A  Lot Help from another person to put on and taking off regular upper body clothing?: A Lot Help from another person to put on and taking off regular lower body clothing?: Total 6 Click Score: 9    End of Session    OT Visit Diagnosis: Muscle weakness (generalized) (M62.81);Other abnormalities of gait and mobility (R26.89)   Activity Tolerance Patient tolerated treatment well   Patient Left in bed;with call bell/phone within reach;with bed alarm set;with nursing/sitter in room   Nurse Communication Mobility status        Time: 2563-8937 OT Time Calculation (min): 27 min  Charges: OT General Charges $OT Visit: 1 Visit OT Treatments $Therapeutic Activity: 8-22 mins  Lou Cal, [place] Pager [phone] Office [place] 05/25/2019, 6:25 PM

## 2019-05-25 NOTE — Progress Notes (Signed)
NAME:  Carlos Daywalt., MRN:  [id number], DOB:  06-03-1985, LOS: 20 ADMISSION DATE:  04/25/2019, CONSULTATION DATE:  05/02/2019 REFERRING MD:  Dr. Leonel Ramsay, CHIEF COMPLAINT:  Slurred speech  Brief History   34 yo male smoker found to have slurred speech and Rt sided weakness.  Admitted with left MCA CVA and multiple smaller embolic infarcts s/p tPA and thrombectomy in IR.  Additionally found to have left ventricular  thrombus.    Course complicated by septic shock r/t HCAP, AKI requiring CRRT, polymorphic VT and cardiogenic shock.  Intermittent pressor dependence, required tracheostomy  Past Medical History  Systolic CHF with non ischemic CM, Cocaine abuse, OSA, DM Hx of CHF (EF 15%)  Hx myocarditis Smoker 1/2 ppd   Significant Hospital Events   2/05 Admit, tPA, IR thrombectomy 2/6 100% on fvent at 2300  Overnight requiring increasing levophed and phenylephrine.    2/6: switched to levophed, epi, started antibiotics, started on CRRT.  2/9  developed wide-complex tachycardia and hypotension, started on amiodarone drip, increase Levophed drip 2/15 drop in platelets, heparin stopped, bival started 2/17 Hypotensive and back on Levophed 2/18 trach, lines changed 2/19 started milrinone , fever 103 2/20 Episode of wide-complex tachycardia yesterday, changed from Levophed to vasopressin 2/22 stopping vanc. Still on inotrope support. Some AF w/ RVR. Had to be placed back on pressors. ivabradine added 2/23 still on pressors/ CRRT continued 2/24 tmax 98.4, Remains on CRRT, even UF, remains anuric, Levophed at 14 mcg/min, Milrinone at 0.125 mcg/kg/min, Coox 63.4 Doing well this morning on ATC, no events overnight.  Midodrine added   2/25 more interactive, remains on ATC, tmax 99.5/ WBC 21.4, ~900 ml liquid stool/ 24 hours, neg Cdiff, Levophed up to 20 mcg/min, CVP 2, ,milrinone remains at 0.125 mcg/kg/min, coox 92.7, Off CRRT since last night ~2000 s/p clotted x 3 off citrate, restarted    3/1 Amio drip restarted for WCT/atrial flutter, milrinone turned off 3/2 CRRT stopped, permacath placed 3/5 crrt resumed 3/6 started on pressors for crrt tolerance 3/8 No longer on CRRT or pressor support   Consults:  Neuro IR Cardiology  Nephrology Heart Failure  EP   Procedures:  2/5-2/6:  TPA given at 428 am (total of 90 Mg) 520 am went to IR  S/P Lt common carotid arteriogram followed by complete revascularization of occluded LT MCA sup division mid M2 seg with x 1 pass with 30mx 40 mm solitaire X ret river device and penumbra aspiration with TICI 3 revascularization  ETT 2/05 >> 2/18 LIJ CVL 2/5 >>2/18  RT [place] CVL 2/18 >>3/2 RIJ HD cath 2/6 >>2/18 6 shiley cuffed trach 2/18  >>3/4  RT fem HD 2/19 >>3/2 RIJ permacath 3/2>> 3/4 shiley 4 uncuffed.... dislodged placed back 6cuffed shiley that evening 3/5: change to #6 cuffed distal XLT  Significant Diagnostic Tests:  CT angio head/neck 2/05 >> occlusion of Lt MCA bifurcation Echo 2/05 >> EF less than 20%, cannot rule out apical thrombus MRI brain 2/11 > extensive acute infarction of multiple areas without large or medium vessel occlusion Echocardiogram 2/8 Left ventricular ejection fraction, by estimation, is <20%. The left  ventricle has severely decreased function. The left ventricle demonstrates  global hypokinesis. The left ventricular internal cavity size was severely  dilated. Possible small 0.8 x  0.6 cm apical thrombus. Somewhat subtle finding, could confirm with  cardiac MRI. However, if patient has had CVA, would be reasonable to  anticoagulate given severity of LV dysfunction if no contraindications to  anticoagulation.   Micro Data:  SARS CoV2 PCR 2/05 >> negative Influenza PCR 2/05 >> negative Urine 2/6 >> ng resp 2/6 >> nml flora BC 2/6 >>ng resp 2/16 >> ng [facility] 2/19 >> negative  BC x 2 2/25 >>ng Cdiff 2/25 >> neg  Antimicrobials:  mero 2/6 -2/12 Zosyn 2/6 , 2/19 >> 2/23 vanc 2/6 >> 2/9 , 2/19  >>2/22  Interim history/subjective:  Off pressors, off CVVH since 3/8 Has tolerated PSV but has not done ATC since difficulty with trach and decompensation on 3/5 Currently on PCV  Objective   Blood pressure (!) 117/97, pulse (!) 115, temperature (!) 100.5 F (38.1 C), temperature source Oral, resp. rate (!) 22, height 6' 1"  (1.854 m), weight 110.8 kg, SpO2 95 %.    Vent Mode: PSV;CPAP FiO2 (%):  [40 %-50 %] 40 % Set Rate:  [20 bmp] 20 bmp PEEP:  [5 cmH20] 5 cmH20 Pressure Support:  [10 cmH20] 10 cmH20 Plateau Pressure:  [15 cmH20-23 cmH20] 22 cmH20   Intake/Output Summary (Last 24 hours) at 05/25/2019 0827 Last data filed at 05/25/2019 0800 Gross per 24 hour  Intake 1765.5 ml  Output 585 ml  Net 1180.5 ml   Filed Weights   05/23/19 0500 05/24/19 0500 05/25/19 0500  Weight: 112.2 kg 110.2 kg 110.8 kg    Examination:  General -awake ill-appearing man, ventilated Eyes -pupils equal, react ENT -trach in place, no skin lesions, no significant secretions reported but currently requiring suctioning Cardiac -regular, distant, no murmur Chest -coarse bilateral breath sounds, no wheezing Abdomen -soft, nondistended, slightly tympanic, positive bowel sounds Extremities -trace bilateral pretibial edema Skin -no rash Neuro -awake, nods to questions and follow commands slowly    Resolved Hospital Problem list   HCAP 2/59, Acute metabolic encephalopathy 2nd to hypoxia and renal failure, Cardiogenic shock  Assessment & Plan:   Acute hypoxic, hypercapnic respiratory failure with compromised airway in setting of CVA. Failure to wean from vent s/p tracheostomy. Okay to use PCV as rest/support mode Attempted progressed to ATC on 3/10 if possible.  Start with PSV Now with a #6 XLT trach, following with speech therapy.  Standard trach care.  Keep track of suctioning frequency Follow intermittent chest x-ray  Acute on chronic systolic CHF, non-ischemic.  Course complicated by  cardiogenic shock now improved Polymorphic VT. Apical thrombus in left ventricle. HLD. Negative fluid balance as he can tolerate Continue amiodarone, aspirin, Lipitor, ivabradine Remains on bivalirudin.  Plan transition to warfarin  Lt MCA CVA likely embolic. Hx of cocaine abuse, depression. Prozac as ordered PT/OT  AKI from ATN in setting of hypoxia, cardiogenic shock. Off CVVH since 3/7.  Likely attempt intermittent HD on 3/10.  Appreciate nephrology assistance.  If he is unable to tolerate hemodynamically then we will need to talk about goals of care with family and patient.  Unclear that there is any role to go back on CVVH as unclear endpoint Follow urine output, BMP. + 3.7L  HIT. Anemia of critical illness. - heparin induced Plt Ab 1.692 from 05/02/19 - SRA positive, 90% from 05/03/19 Continue bivalirudin Follow CBC Conservative transfusion goal hemoglobin 7  DM type II poorly controlled with hyperglycemia. Sign scale insulin per protocol Levemir  Insomnia. Ambien as ordered  Best practice:  Diet: NPO,  TFs DVT prophylaxis: SCDs/ bivalirudin GI prophylaxis: protonix Mobility: progress when able Code Status: full code Disposition: ICU   Labs:   CMP Latest Ref Rng & Units 05/25/2019 05/24/2019 05/24/2019  Glucose 70 - 99  mg/dL 196(H) 187(H) 190(H)  BUN 6 - 20 mg/dL 80(H) 72(H) 60(H)  Creatinine 0.61 - 1.24 mg/dL 5.90(H) 5.78(H) 5.34(H)  Sodium 135 - 145 mmol/L 145 144 145  Potassium 3.5 - 5.1 mmol/L 3.6 3.6 3.6  Chloride 98 - 111 mmol/L 95(L) 93(L) 91(L)  CO2 22 - 32 mmol/L 34(H) 35(H) 37(H)  Calcium 8.9 - 10.3 mg/dL 9.2 9.2 9.8  Total Protein 6.5 - 8.1 g/dL - - -  Total Bilirubin 0.3 - 1.2 mg/dL - - -  Alkaline Phos 38 - 126 U/L - - -  AST 15 - 41 U/L - - -  ALT 0 - 44 U/L - - -    CBC Latest Ref Rng & Units 05/25/2019 05/24/2019 05/23/2019  WBC 4.0 - 10.5 K/uL 8.1 9.6 5.6  Hemoglobin 13.0 - 17.0 g/dL 7.3(L) 7.6(L) 7.2(L)  Hematocrit 39.0 - 52.0 % 23.6(L)  24.9(L) 22.9(L)  Platelets 150 - 400 K/uL 315 329 247    ABG    Component Value Date/Time   PHART 7.335 (L) 05/21/2019 0427   PCO2ART 59.2 (H) 05/21/2019 0427   PO2ART 165.0 (H) 05/21/2019 0427   HCO3 31.5 (H) 05/21/2019 0427   TCO2 43 (H) 05/22/2019 2019   ACIDBASEDEF 3.0 (H) 05/04/2019 2218   O2SAT >100.0 05/25/2019 0153    CBG (last 3)  Recent Labs    05/24/19 2342 05/25/19 0347 05/25/19 0812  GLUCAP 198* 201* 179*    Independent CC time 31 minutes  Baltazar Apo, MD, PhD 05/25/2019, 8:35 AM [facility] [phone] or if no answer [phone]

## 2019-05-26 ENCOUNTER — Inpatient Hospital Stay (HOSPITAL_COMMUNITY): Payer: No Typology Code available for payment source

## 2019-05-26 DIAGNOSIS — I639 Cerebral infarction, unspecified: Secondary | ICD-10-CM | POA: Diagnosis not present

## 2019-05-26 DIAGNOSIS — N170 Acute kidney failure with tubular necrosis: Secondary | ICD-10-CM | POA: Diagnosis not present

## 2019-05-26 DIAGNOSIS — J9601 Acute respiratory failure with hypoxia: Secondary | ICD-10-CM | POA: Diagnosis not present

## 2019-05-26 LAB — RENAL FUNCTION PANEL
Albumin: 2.6 g/dL — ABNORMAL LOW (ref 3.5–5.0)
Albumin: 2.6 g/dL — ABNORMAL LOW (ref 3.5–5.0)
Anion gap: 17 — ABNORMAL HIGH (ref 5–15)
Anion gap: 17 — ABNORMAL HIGH (ref 5–15)
BUN: 63 mg/dL — ABNORMAL HIGH (ref 6–20)
BUN: 79 mg/dL — ABNORMAL HIGH (ref 6–20)
CO2: 25 mmol/L (ref 22–32)
CO2: 26 mmol/L (ref 22–32)
Calcium: 9.2 mg/dL (ref 8.9–10.3)
Calcium: 9.3 mg/dL (ref 8.9–10.3)
Chloride: 99 mmol/L (ref 98–111)
Chloride: 102 mmol/L (ref 98–111)
Creatinine, Ser: 4.97 mg/dL — ABNORMAL HIGH (ref 0.61–1.24)
Creatinine, Ser: 5.9 mg/dL — ABNORMAL HIGH (ref 0.61–1.24)
GFR calc Af Amer: 16 mL/min — ABNORMAL LOW (ref >60)
GFR calc Af Amer: 13 mL/min — ABNORMAL LOW (ref >60)
GFR calc non Af Amer: 14 mL/min — ABNORMAL LOW (ref >60)
GFR calc non Af Amer: 11 mL/min — ABNORMAL LOW (ref >60)
Glucose, Bld: 203 mg/dL — ABNORMAL HIGH (ref 70–99)
Glucose, Bld: 216 mg/dL — ABNORMAL HIGH (ref 70–99)
Phosphorus: 3.5 mg/dL (ref 2.5–4.6)
Phosphorus: 4.1 mg/dL (ref 2.5–4.6)
Potassium: 4.5 mmol/L (ref 3.5–5.1)
Potassium: 4.4 mmol/L (ref 3.5–5.1)
Sodium: 141 mmol/L (ref 135–145)
Sodium: 145 mmol/L (ref 135–145)

## 2019-05-26 LAB — CBC
HCT: 25.9 % — ABNORMAL LOW (ref 39.0–52.0)
Hemoglobin: 7.9 g/dL — ABNORMAL LOW (ref 13.0–17.0)
MCH: 30.9 pg (ref 26.0–34.0)
MCHC: 30.5 g/dL (ref 30.0–36.0)
MCV: 101.2 fL — ABNORMAL HIGH (ref 80.0–100.0)
Platelets: 381 10*3/uL (ref 150–400)
RBC: 2.56 MIL/uL — ABNORMAL LOW (ref 4.22–5.81)
RDW: 14.9 % (ref 11.5–15.5)
WBC: 11 10*3/uL — ABNORMAL HIGH (ref 4.0–10.5)
nRBC: 1.4 % — ABNORMAL HIGH (ref 0.0–0.2)

## 2019-05-26 LAB — APTT
aPTT: 49 seconds — ABNORMAL HIGH (ref 24–36)
aPTT: 53 seconds — ABNORMAL HIGH (ref 24–36)

## 2019-05-26 LAB — GLUCOSE, CAPILLARY
Glucose-Capillary: 186 mg/dL — ABNORMAL HIGH (ref 70–99)
Glucose-Capillary: 220 mg/dL — ABNORMAL HIGH (ref 70–99)
Glucose-Capillary: 262 mg/dL — ABNORMAL HIGH (ref 70–99)
Glucose-Capillary: 194 mg/dL — ABNORMAL HIGH (ref 70–99)
Glucose-Capillary: 238 mg/dL — ABNORMAL HIGH (ref 70–99)
Glucose-Capillary: 287 mg/dL — ABNORMAL HIGH (ref 70–99)

## 2019-05-26 LAB — COOXEMETRY PANEL
Carboxyhemoglobin: 2.5 % — ABNORMAL HIGH (ref 0.5–1.5)
Methemoglobin: 1.1 % (ref 0.0–1.5)
O2 Saturation: 90.3 %
Total hemoglobin: 8.3 g/dL — ABNORMAL LOW (ref 12.0–16.0)

## 2019-05-26 LAB — MAGNESIUM: Magnesium: 1.9 mg/dL (ref 1.7–2.4)

## 2019-05-26 LAB — PROTIME-INR
INR: 1.4 — ABNORMAL HIGH (ref 0.8–1.2)
Prothrombin Time: 17.4 seconds — ABNORMAL HIGH (ref 11.4–15.2)

## 2019-05-26 LAB — CALCIUM, IONIZED: Calcium, Ionized, Serum: 5 mg/dL (ref 4.5–5.6)

## 2019-05-26 MED ORDER — CHLORHEXIDINE GLUCONATE CLOTH 2 % EX PADS
6.0000 | MEDICATED_PAD | Freq: Every day | CUTANEOUS | Status: DC
  Administered 2019-05-29: 6 via TOPICAL

## 2019-05-26 MED ORDER — INSULIN DETEMIR 100 UNIT/ML ~~LOC~~ SOLN
27.0000 [IU] | Freq: Two times a day (BID) | SUBCUTANEOUS | Status: DC
  Administered 2019-05-26 – 2019-05-28 (×5): 27 [IU] via SUBCUTANEOUS
  Filled 2019-05-26 (×6): qty 0.27

## 2019-05-26 MED ORDER — WARFARIN SODIUM 5 MG PO TABS
5.0000 mg | ORAL_TABLET | Freq: Once | ORAL | Status: AC
  Administered 2019-05-26: 5 mg via ORAL
  Filled 2019-05-26: qty 1

## 2019-05-26 MED ORDER — ACETAMINOPHEN 160 MG/5ML PO SOLN
500.0000 mg | Freq: Four times a day (QID) | ORAL | Status: DC | PRN
  Administered 2019-05-26 – 2019-05-31 (×14): 500 mg
  Filled 2019-05-26 (×16): qty 20.3

## 2019-05-26 MED ORDER — SODIUM CHLORIDE 0.9 % IV SOLN
510.0000 mg | Freq: Once | INTRAVENOUS | Status: AC
  Administered 2019-05-27: 510 mg via INTRAVENOUS
  Filled 2019-05-26 (×2): qty 17

## 2019-05-26 NOTE — Progress Notes (Signed)
Coretrack upon assessment measured at 35 mark, originally placed at 70 mark. Coretrack removed and order to place NG tube. Abdominal x-ray confirmed NG tube lies in the stomach.

## 2019-05-26 NOTE — Discharge Instructions (Signed)

## 2019-05-26 NOTE — Progress Notes (Addendum)
ANTICOAGULATION CONSULT NOTE  Pharmacy Consult:  Bivalirudin + add Coumadin Indication: stroke 2/5, LV thrombus 2/8, HIT 2/15   Allergies  Allergen Reactions  . Heparin Other (See Comments)    Heparin induced thrombocytopenia. 2/15 HIT OD 1.692. 2/16 SRA positive-90.     Patient Measurements: Height: 6\' 1"  (185.4 cm) Weight: 234 lb 9.1 oz (106.4 kg) IBW/kg (Calculated) : 79.9   Vital Signs: Temp: 101.5 F (38.6 C) (03/11 0400) Temp Source: Axillary (03/11 0400) BP: 109/88 (03/11 0800) Pulse Rate: 108 (03/11 0800)  Labs: Recent Labs    05/24/19 0920 05/24/19 1830 05/25/19 0133 05/25/19 1045 05/25/19 1626 05/26/19 0337  HGB 7.6*  --  7.3*  --   --  7.9*  HCT 24.9*  --  23.6*  --   --  25.9*  PLT 329  --  315  --   --  381  APTT  --   --  47* 49*  --  49*  LABPROT  --   --  17.0*  --   --  17.4*  INR  --   --  1.4*  --   --  1.4*  CREATININE  --    < > 5.90*  --  3.93* 4.97*   < > = values in this interval not displayed.    Estimated Creatinine Clearance: 26.8 mL/min (A) (by C-G formula based on SCr of 4.97 mg/dL (H)).  Assessment: 34 yr old male with left MCA infarct - received TPA and revascularization with IR on 2/5. On 2/8 found to have small LV apical thrombus on ECHO. Pharmacy consulted to dose IV heparin, which was switched to bivalirudin 2/15 for HIT. 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 SRA very positive at 35. Heparin allergy has appropriately been added to chart and updated with SRA results.   APTT is subtherapeutic at 49 at 0.06 mg/kg/hr; no bleeding reported. Hemoglobin is low at 7.9 but trending up; nephrology has started Aranesp. Platelet count WNL. INR is 1.4. Will continue transitioning from bivalirudin to warfarin conservatively due to concomitant aspirin and drug interaction with amiodarone; day 3 of overlap.    Goal of Therapy:  APTT 50-65 sec  Monitor platelets by anticoagulation protocol: Yes   Plan:  -  Increase bivalirudin infusion to 0.07 mg/kg/hr - Recheck aPTT in 2 hours - Warfarin 5 mg x1 this evening - Daily aPTT, CBC, INR - Monitor for s/sx of bleeding  ADDENDUM: APTT is therapeutic at 53 after increasing rate to 0.07 mg/kg/hr with no bleeding reported. Continue at current rate and check aPTT tomorrow morning.   [name], PharmD PGY1 Pharmacy Resident

## 2019-05-26 NOTE — Progress Notes (Signed)
[facility] Progress Note    Assessment/ Plan:   1. AKIpresumably due to ATN with ischemic/nephrotoxic insults worsened by cardiogenic shock. Started on CRRT 04/23/19-  3/7.   Tolerated IHD yesterday- removed 2 liters.  No absolute needs today, will plan for tomorrow   2. Cardiogenic shock- Hx milrinone due to development of a fib with rvr and wide complex tachycardia. -Known severe NICM with EF <20% andmay not tolerate  IHD but also can't continue CRRT indefinitely. -  tolerated iHD    3. Acute stroke- presumed embolic with bilateral infarcts and LV thrombus- received tpa on admission 4. SVT/polymorphic VT- per Cardiology 5. Acute hypoxic respiratory failure- s/p trach  6. 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 HIT Ab+on Argatroban. 7. HTN/vol-  BP low-  Does not appear to have significant volume on board, CCM tells me he is much above his EDW-  By weights seems to be on the lower side- no pul edema by x-ray 3/11 - on max dose midodrine - UF as able with HD 8. Anemia-   iron stores low, got feraheme on 3/9, wil need to redose tomorrow - also  giving ESA  9. Bones-  Phos ok right now-  Just off of CRRT-   intact PTH 70 10. Disposition- overall prognosis is poor with ongoing RRT , trach, as well as cardiogenic shock/cardiomyopathy.  Whether or not he tolerates IHD will be a big step- seems to have passed that step but with trach and MS the way it is-  now the only option for HD out of hospital is LTACH  Subjective:   605 UOP - BUN and crt worse again -  Alert but difficult to tell how much information he is taking in.  Tolerated IHD yest - removed 2000   Objective:   BP 90/73   Pulse (!) 113   Temp (!) 100.6 F (38.1 C) (Axillary)   Resp (!) 21   Ht 6' 1"  (1.854 m)   Wt 106.4 kg   SpO2 99%   BMI 30.95 kg/m   Intake/Output Summary (Last 24 hours) at 05/26/2019 1023 Last data filed at 05/26/2019 0700 Gross per 24 hour  Intake 1533.37 ml  Output 2465 ml   Net -931.63 ml   Weight change: 0 kg  Physical Exam: Gen: awake in bed, trach collar, nonverbal. Follows commands but distracted  HEENT - trach in place CVS: S1S2 no rub Resp:reduced breath sounds Abd: soft/ND Ext: he has no edema Access: rightIJtunneled catheter  Imaging: DG CHEST PORT 1 VIEW  Result Date: 05/26/2019 CLINICAL DATA:  34 year old male with history of fever. EXAM: PORTABLE CHEST 1 VIEW COMPARISON:  Chest x-ray 05/25/2019. FINDINGS: A tracheostomy tube is in place with tip 3.7 cm above the carina. Right IJ PermCath with tip terminating at the superior cavoatrial junction. An endotracheal tube is in place with tip lung volumes are slightly low. Ill-defined opacity in the left base partially obscuring the left hemidiaphragm concerning for left lower lobe consolidative airspace disease. No definite pleural effusions. No evidence of pulmonary edema. Heart size is mildly enlarged. Upper mediastinal contours are within normal limits. IMPRESSION: 1. Support apparatus, as above. 2. Consolidative airspace disease in the left lower lobe concerning for pneumonia. 3. Mild cardiomegaly. Electronically Signed   By: Vinnie Langton M.D.   On: 05/26/2019 09:50   DG Chest Port 1 View  Result Date: 05/25/2019 CLINICAL DATA:  Respiratory failure. EXAM: PORTABLE CHEST 1 VIEW COMPARISON:  05/23/2019 FINDINGS: The tracheostomy  tube, feeding tube and right IJ dialysis catheters are stable. Stable mild cardiac enlargement and slight worsening pulmonary edema. No large pleural effusions or pneumothorax. IMPRESSION: Slight worsening pulmonary edema. Stable support apparatus. Electronically Signed   By: Marijo Sanes M.D.   On: 05/25/2019 08:33   DG Abd Portable 1V  Result Date: 05/25/2019 CLINICAL DATA:  Check gastric catheter placement EXAM: PORTABLE ABDOMEN - 1 VIEW COMPARISON:  Film from earlier in the same day. FINDINGS: Previously seen feeding catheter in the mid esophagus has been removed.  New nasogastric catheter has been advanced into the stomach. IMPRESSION: Gastric catheter within the stomach. Electronically Signed   By: Inez Catalina M.D.   On: 05/25/2019 22:16   DG Abd Portable 1V  Result Date: 05/25/2019 CLINICAL DATA:  Check gastric catheter placement EXAM: PORTABLE ABDOMEN - 1 VIEW COMPARISON:  05/25/2019 FINDINGS: Tracheostomy tube and right jugular central line are again noted and stable. Cardiomegaly is again seen. Previously seen feeding catheter has been withdrawn and now lies in the distal esophagus. This should be advanced several cm. IMPRESSION: Feeding catheter within the mid esophagus. This should be advanced several cm into the stomach. Electronically Signed   By: Inez Catalina M.D.   On: 05/25/2019 21:26    Labs: BMET Recent Labs  Lab 05/22/19 1642 05/22/19 1649 05/22/19 2019 05/23/19 0522 05/24/19 0520 05/24/19 1830 05/25/19 0133 05/25/19 1626 05/26/19 0337  NA 142   < > 139 142 145 144 145 140 141  K 3.5   < > 3.1* 3.2* 3.6 3.6 3.6 4.2 4.5  CL 85*   < > 83* 86* 91* 93* 95* 98 99  CO2 39*  --   --  40* 37* 35* 34* 27 25  GLUCOSE 165*   < > 233* 159* 190* 187* 196* 229* 203*  BUN 25*   < > 23* 32* 60* 72* 80* 44* 63*  CREATININE 2.38*   < > 2.20* 3.20* 5.34* 5.78* 5.90* 3.93* 4.97*  CALCIUM 10.7*  --   --  10.0 9.8 9.2 9.2 8.9 9.2  PHOS 3.0  --   --  2.5 3.4 4.0 3.7 2.8 3.5   < > = values in this interval not displayed.   CBC Recent Labs  Lab 05/23/19 0522 05/24/19 0920 05/25/19 0133 05/26/19 0337  WBC 5.6 9.6 8.1 11.0*  HGB 7.2* 7.6* 7.3* 7.9*  HCT 22.9* 24.9* 23.6* 25.9*  MCV 98.3 100.8* 99.6 101.2*  PLT 247 329 315 381    Medications:    . amiodarone  200 mg Per Tube BID  . aspirin  81 mg Per Tube Daily  . atorvastatin  40 mg Per Tube q1800  . chlorhexidine gluconate (MEDLINE KIT)  15 mL Mouth Rinse BID  . Chlorhexidine Gluconate Cloth  6 each Topical Q0600  . Chlorhexidine Gluconate Cloth  6 each Topical Q0600  . darbepoetin  (ARANESP) injection - NON-DIALYSIS  100 mcg Subcutaneous Q Mon-1800  . feeding supplement (PRO-STAT SUGAR FREE 64)  60 mL Per Tube BID  . FLUoxetine  20 mg Per Tube Daily  . insulin aspart  0-20 Units Subcutaneous Q4H  . insulin aspart  5 Units Subcutaneous Q4H  . insulin detemir  27 Units Subcutaneous BID  . mouth rinse  15 mL Mouth Rinse 10 times per day  . midodrine  15 mg Per Tube TID WC  . mupirocin ointment  1 application Nasal BID  . pantoprazole sodium  40 mg Per Tube Q1200  . sodium  chloride flush  10-40 mL Intracatheter Q12H  . warfarin  5 mg Oral ONCE-1800  . Warfarin - Pharmacist Dosing Inpatient   Does not apply Luke  05/26/2019, 10:23 AM

## 2019-05-26 NOTE — Progress Notes (Signed)
Physical Therapy Treatment Patient Details Name: Carlos Brewer. MRN: [id number] DOB: 05/14/1985 Today's Date: 05/26/2019    History of Present Illness Pt is a 34 y/o male smoker who initially presented on 2/5 with slurred speech and Rt sided weakness. Admitted with left MCA CVA and multiple smaller embolic infarcts s/p tPA and thrombectomy in IR.  Additionally found to have left ventricular  thrombus. Hospital course complicated by septic shock r/t HCAP, AKI requiring CRRT, polymorphic VT and cardiogenic shock. Echo EF < 20%. Pt with Intermittent pressor dependence, required tracheostomy. Tolerating trach collar 3/4, back on vent 3/5 with flash pulmonary edema.    PT Comments    Initially lethargic, but aroused when therapy started moving him around.  Emphasis as usual on transitions, sitting balance and standing activity.     Follow Up Recommendations  CIR;Supervision/Assistance - 24 hour     Equipment Recommendations  Other (comment)(TBA)    Recommendations for Other Services Rehab consult     Precautions / Restrictions Precautions Precautions: Fall Precaution Comments: NG    Mobility  Bed Mobility Overal bed mobility: Needs Assistance Bed Mobility: Rolling;Sidelying to Sit Rolling: Mod assist Sidelying to sit: Mod assist;+2 for safety/equipment       General bed mobility comments: AS last treatment, up via L elbow.  directional cues with time for processing.  assist forward and to the side then up.  Transfers Overall transfer level: Needs assistance   Transfers: Sit to/from Stand Sit to Stand: Mod assist;+2 physical assistance;+2 safety/equipment            Ambulation/Gait             General Gait Details: not able yet   Stairs             Wheelchair Mobility    Modified Rankin (Stroke Patients Only) Modified Rankin (Stroke Patients Only) Pre-Morbid Rankin Score: No symptoms Modified Rankin: Severe disability     Balance  Overall balance assessment: Needs assistance Sitting-balance support: Feet supported;Single extremity supported;No upper extremity supported Sitting balance-Leahy Scale: Poor Sitting balance - Comments: pt sways around midline and can maintain some semplance of upright fro 10-15 seconds then squirms back or down toward elbow.   Standing balance support: Bilateral upper extremity supported Standing balance-Leahy Scale: Poor Standing balance comment: needing external support.  Is able to stand fully upright for 20-30 seconds before sitting down, question if this from loss of focus, weakness or fatigue.                            Cognition Arousal/Alertness: Awake/alert Behavior During Therapy: Flat affect Overall Cognitive Status: Impaired/Different from baseline                     Current Attention Level: Focused   Following Commands: Follows one step commands inconsistently;Follows one step commands with increased time Safety/Judgement: Decreased awareness of safety;Decreased awareness of deficits   Problem Solving: Requires tactile cues;Requires verbal cues;Slow processing        Exercises      General Comments        Pertinent Vitals/Pain Faces Pain Scale: Hurts little more Pain Location: rectal pain Pain Descriptors / Indicators: Burning;Grimacing Pain Intervention(s): Monitored during session    Home Living                      Prior Function  PT Goals (current goals can now be found in the care plan section) Acute Rehab PT Goals Patient Stated Goal: to d/c home with mother after rehab PT Goal Formulation: With patient/family Time For Goal Achievement: 06/01/19 Potential to Achieve Goals: Good Progress towards PT goals: Progressing toward goals(slowly)    Frequency    Min 4X/week      PT Plan Current plan remains appropriate    Co-evaluation              AM-PAC PT "6 Clicks" Mobility   Outcome Measure   Help needed turning from your back to your side while in a flat bed without using bedrails?: A Lot Help needed moving from lying on your back to sitting on the side of a flat bed without using bedrails?: A Lot Help needed moving to and from a bed to a chair (including a wheelchair)?: A Lot Help needed standing up from a chair using your arms (e.g., wheelchair or bedside chair)?: A Lot Help needed to walk in hospital room?: Total Help needed climbing 3-5 steps with a railing? : Total 6 Click Score: 10    End of Session Equipment Utilized During Treatment: Oxygen Activity Tolerance: Patient tolerated treatment well;Patient limited by pain;Patient limited by fatigue Patient left: with call bell/phone within reach;in bed;with family/visitor present Nurse Communication: Mobility status PT Visit Diagnosis: Other abnormalities of gait and mobility (R26.89);Muscle weakness (generalized) (M62.81);Other symptoms and signs involving the nervous system (R29.898)     Time: 8828-0034 PT Time Calculation (min) (ACUTE ONLY): 29 min  Charges:  $Therapeutic Activity: 8-22 mins $Neuromuscular Re-education: 8-22 mins                     05/26/2019  Ginger Carne., PT Acute Rehabilitation Services [phone]  (pager) [phone]  (office)   Carlos Brewer 05/26/2019, 12:12 PM

## 2019-05-26 NOTE — Progress Notes (Signed)
 NAME:  Carlos Alvin Roza Jr., Carlos Brewer, Carlos Brewer, LOS: 35 ADMISSION DATE:  04/24/2019, CONSULTATION DATE:  05/11/2019 REFERRING MD:  Dr. Kirkpatrick, CHIEF COMPLAINT:  Slurred speech  Brief History   34 yo male smoker found to have slurred speech and Rt sided weakness.  Admitted with left MCA CVA and multiple smaller embolic infarcts s/p tPA and thrombectomy in IR.  Additionally found to have left ventricular  thrombus.    Course complicated by septic shock r/t HCAP, AKI requiring CRRT, polymorphic VT and cardiogenic shock.  Intermittent pressor dependence, required tracheostomy  Past Medical History  Systolic CHF with non ischemic CM, Cocaine abuse, OSA, DM Hx of CHF (EF 15%)  Hx myocarditis Smoker 1/2 ppd   Significant Hospital Events   2/05 Admit, tPA, IR thrombectomy 2/6 100% on fvent at 2300  Overnight requiring increasing levophed and phenylephrine.    2/6: switched to levophed, epi, started antibiotics, started on CRRT.  2/9  developed wide-complex tachycardia and hypotension, started on amiodarone drip, increase Levophed drip 2/15 drop in platelets, heparin stopped, bival started 2/17 Hypotensive and back on Levophed 2/18 trach, lines changed 2/19 started milrinone , fever 103 2/20 Episode of wide-complex tachycardia yesterday, changed from Levophed to vasopressin 2/22 stopping vanc. Still on inotrope support. Some AF w/ RVR. Had to be placed back on pressors. ivabradine added 2/23 still on pressors/ CRRT continued 2/24 tmax 98.4, Remains on CRRT, even UF, remains anuric, Levophed at 14 mcg/min, Milrinone at 0.125 mcg/kg/min, Coox 63.4 Doing well this morning on ATC, no events overnight.  Midodrine added   2/25 more interactive, remains on ATC, tmax 99.5/ WBC 21.4, ~900 ml liquid stool/ 24 hours, neg Cdiff, Levophed up to 20 mcg/min, CVP 2, ,milrinone remains at 0.125 mcg/kg/min, coox 92.7, Off CRRT since last night ~2000 s/p clotted x 3 off citrate, restarted    3/1 Amio drip restarted for WCT/atrial flutter, milrinone turned off 3/2 CRRT stopped, permacath placed 3/5 crrt resumed 3/6 started on pressors for crrt tolerance 3/8 No longer on CRRT or pressor support   Consults:  Neuro IR Cardiology  Nephrology Heart Failure  EP   Procedures:  2/5-2/6:  TPA given at 428 am (total of 90 Mg) 520 am went to IR  S/P Lt common carotid arteriogram followed by complete revascularization of occluded LT MCA sup division mid M2 seg with x 1 pass with 4mmx 40 mm solitaire X ret river device and penumbra aspiration with TICI 3 revascularization  ETT 2/05 >> 2/18 [facility] CVL 2/5 >>2/18  RT [employer] CVL 2/18 >>3/2 RIJ HD cath 2/6 >>2/18 6 shiley cuffed trach 2/18  >>3/4  RT fem HD 2/19 >>3/2 RIJ permacath 3/2>> 3/4 shiley 4 uncuffed.... dislodged placed back 6cuffed shiley that evening 3/5: change to #6 cuffed distal XLT  Significant Diagnostic Tests:  CT angio head/neck 2/05 >> occlusion of Lt MCA bifurcation Echo 2/05 >> EF less than 20%, cannot rule out apical thrombus MRI brain 2/11 > extensive acute infarction of multiple areas without large or medium vessel occlusion Echocardiogram 2/8 Left ventricular ejection fraction, by estimation, is <20%. The left  ventricle has severely decreased function. The left ventricle demonstrates  global hypokinesis. The left ventricular internal cavity size was severely  dilated. Possible small 0.8 x  0.6 cm apical thrombus. Somewhat subtle finding, could confirm with  cardiac MRI. However, if patient has had CVA, would be reasonable to  anticoagulate given severity of LV dysfunction if no contraindications to    anticoagulation.   Micro Data:  SARS CoV2 PCR 2/05 >> negative Influenza PCR 2/05 >> negative Urine 2/6 >> ng resp 2/6 >> nml flora BC 2/6 >>ng resp 2/16 >> ng [facility] 2/19 >> negative  BC x 2 2/25 >>ng Cdiff 2/25 >> neg  Antimicrobials:  mero 2/6 -2/12 Zosyn 2/6 , 2/19 >> 2/23 vanc 2/6 >> 2/9 , 2/19  >>2/22  Interim history/subjective:  S/p hemodialysis session yesterday. Overnight febrile with Tmax 101.7  Objective   Blood pressure 111/78, pulse (!) 116, temperature (!) 101.5 F (38.6 C), temperature source Axillary, resp. rate 20, height 6' 1" (1.854 m), weight 106.4 kg, SpO2 100 %.    Vent Mode: PCV FiO2 (%):  [40 %] 40 % Set Rate:  [20 bmp] 20 bmp PEEP:  [5 cmH20] 5 cmH20 Plateau Pressure:  [21 cmH20-24 cmH20] 22 cmH20   Intake/Output Summary (Last 24 hours) at 05/26/2019 0803 Last data filed at 05/26/2019 0700 Gross per 24 hour  Intake 1904.22 ml  Output 2545 ml  Net -640.78 ml   Filed Weights   05/25/19 0920 05/25/19 1300 05/26/19 0500  Weight: 110.8 kg 108.6 kg 106.4 kg   Physical Exam: General: Chronically ill-appearing, mildly agitated,  HENT: [place], AT, OP clear, MMM Neck: Cuffed trach #^ XLT in place, c/d/i Eyes: EOMI, no scleral icterus Respiratory: Clear to auscultation bilaterally.  No crackles, wheezing or rales Cardiovascular: RRR, -M/R/G, no JVD GI: BS+, soft, nontender Extremities:-Edema,-tenderness Neuro: Awake, tracks, sticks tongue out otherwise does not follow commands, moves extremities x 4  Resolved Hospital Problem list   HCAP 9/44, Acute metabolic encephalopathy 2nd to hypoxia and renal failure, Cardiogenic shock  Assessment & Plan:   Acute hypoxic, hypercapnic respiratory failure with compromised airway in setting of CVA. Failure to wean from vent s/p tracheostomy. Today CFB +13L PSV as tolerated however his volume status and agitation is limiting his progress Now with a #6 XLT trach, following with speech therapy Continue routine trach care PRN suctioning  Follow intermittent chest x-ray  Acute on chronic systolic CHF, non-ischemic.  Course complicated by cardiogenic shock now improved Polymorphic VT. Apical thrombus in left ventricle. HLD. Negative fluid balance as he can tolerate. Will need to discuss with Nephro regarding volume  removal Continue amiodarone, aspirin, Lipitor, ivabradine Transition from bivalirudin to warfarin  Fever Tylenol now Obtain trach aspirate and blood cultures Hold on antibiotics for now  Lt MCA CVA likely embolic. Hx of cocaine abuse, depression. Prozac as ordered PT/OT  AKI from ATN in setting of hypoxia, cardiogenic shock. Off CVVH since 3/7.  Likely attempt intermittent HD on 3/10.  Appreciate nephrology assistance.  If he is unable to tolerate hemodynamically then we will need to talk about goals of care with family and patient.  Unclear that there is any role to go back on CVVH as unclear endpoint. Currently +13L since admission Follow urine output, BMP.  HIT. Anemia of critical illness. - heparin induced Plt Ab 1.692 from 05/02/19 - SRA positive, 90% from 05/03/19 Transition from bivalirudin to warfarin Follow CBC Conservative transfusion goal hemoglobin 7  DM type II poorly controlled with hyperglycemia. Sign scale insulin per protocol Levemir  Insomnia. Ambien as ordered  Best practice:  Diet: NPO,  TFs DVT prophylaxis: SCDs/ bivalirudin GI prophylaxis: protonix Mobility: progress when able Code Status: full code Disposition: ICU   Labs:   CMP Latest Ref Rng & Units 05/26/2019 05/25/2019 05/25/2019  Glucose 70 - 99 mg/dL 203(H) 229(H) 196(H)  BUN 6 - 20  mg/dL 63(H) 44(H) 80(H)  Creatinine 0.61 - 1.24 mg/dL 4.97(H) 3.93(H) 5.90(H)  Sodium 135 - 145 mmol/L 141 140 145  Potassium 3.5 - 5.1 mmol/L 4.5 4.2 3.6  Chloride 98 - 111 mmol/L 99 98 95(L)  CO2 22 - 32 mmol/L 25 27 Brewer(H)  Calcium 8.9 - 10.3 mg/dL 9.2 8.9 9.2  Total Protein 6.5 - 8.1 g/dL - - -  Total Bilirubin 0.3 - 1.2 mg/dL - - -  Alkaline Phos 38 - 126 U/L - - -  AST 15 - 41 U/L - - -  ALT 0 - 44 U/L - - -    CBC Latest Ref Rng & Units 05/26/2019 05/25/2019 05/24/2019  WBC 4.0 - 10.5 K/uL 11.0(H) 8.1 9.6  Hemoglobin 13.0 - 17.0 g/dL 7.9(L) 7.3(L) 7.6(L)  Hematocrit 39.0 - 52.0 % 25.9(L) 23.6(L)  24.9(L)  Platelets 150 - 400 K/uL 381 315 329    ABG    Component Value Date/Time   PHART 7.335 (L) 05/21/2019 0427   PCO2ART 59.2 (H) 05/21/2019 0427   PO2ART 165.0 (H) 05/21/2019 0427   HCO3 31.5 (H) 05/21/2019 0427   TCO2 43 (H) 05/22/2019 2019   ACIDBASEDEF 3.0 (H) 05/04/2019 2218   O2SAT >100.0 05/25/2019 0153    CBG (last 3)  Recent Labs    05/25/19 2001 05/25/19 2346 05/26/19 0333  GLUCAP 192* 168* 186*    The patient is critically ill with multiple organ systems failure and requires high complexity decision making for assessment and support, frequent evaluation and titration of therapies, application of advanced monitoring technologies and extensive interpretation of multiple databases.   Critical Care Time devoted to patient care services described in this note is 31 Minutes.   Jane , M.D. [facility] 05/26/2019 8:04 AM   Please see Amion for pager number to reach on-call Pulmonary and Critical Care Team.   

## 2019-05-27 DIAGNOSIS — Z930 Tracheostomy status: Secondary | ICD-10-CM | POA: Diagnosis not present

## 2019-05-27 DIAGNOSIS — I63312 Cerebral infarction due to thrombosis of left middle cerebral artery: Secondary | ICD-10-CM | POA: Diagnosis not present

## 2019-05-27 DIAGNOSIS — I639 Cerebral infarction, unspecified: Secondary | ICD-10-CM | POA: Diagnosis not present

## 2019-05-27 DIAGNOSIS — N170 Acute kidney failure with tubular necrosis: Secondary | ICD-10-CM | POA: Diagnosis not present

## 2019-05-27 LAB — HEPATIC FUNCTION PANEL
ALT: 19 U/L (ref 0–44)
AST: 37 U/L (ref 15–41)
Albumin: 2.6 g/dL — ABNORMAL LOW (ref 3.5–5.0)
Alkaline Phosphatase: 210 U/L — ABNORMAL HIGH (ref 38–126)
Bilirubin, Direct: 0.2 mg/dL (ref 0.0–0.2)
Indirect Bilirubin: 0.8 mg/dL (ref 0.3–0.9)
Total Bilirubin: 1 mg/dL (ref 0.3–1.2)
Total Protein: 7.5 g/dL (ref 6.5–8.1)

## 2019-05-27 LAB — RENAL FUNCTION PANEL
Albumin: 2.7 g/dL — ABNORMAL LOW (ref 3.5–5.0)
Albumin: 3.3 g/dL — ABNORMAL LOW (ref 3.5–5.0)
Anion gap: 21 — ABNORMAL HIGH (ref 5–15)
Anion gap: 25 — ABNORMAL HIGH (ref 5–15)
BUN: 98 mg/dL — ABNORMAL HIGH (ref 6–20)
BUN: 47 mg/dL — ABNORMAL HIGH (ref 6–20)
CO2: 24 mmol/L (ref 22–32)
CO2: 19 mmol/L — ABNORMAL LOW (ref 22–32)
Calcium: 8.9 mg/dL (ref 8.9–10.3)
Calcium: 9.4 mg/dL (ref 8.9–10.3)
Chloride: 100 mmol/L (ref 98–111)
Chloride: 97 mmol/L — ABNORMAL LOW (ref 98–111)
Creatinine, Ser: 6.39 mg/dL — ABNORMAL HIGH (ref 0.61–1.24)
Creatinine, Ser: 4.06 mg/dL — ABNORMAL HIGH (ref 0.61–1.24)
GFR calc Af Amer: 12 mL/min — ABNORMAL LOW (ref >60)
GFR calc Af Amer: 21 mL/min — ABNORMAL LOW (ref >60)
GFR calc non Af Amer: 10 mL/min — ABNORMAL LOW (ref >60)
GFR calc non Af Amer: 18 mL/min — ABNORMAL LOW (ref >60)
Glucose, Bld: 267 mg/dL — ABNORMAL HIGH (ref 70–99)
Glucose, Bld: 228 mg/dL — ABNORMAL HIGH (ref 70–99)
Phosphorus: 3.8 mg/dL (ref 2.5–4.6)
Phosphorus: 4.8 mg/dL — ABNORMAL HIGH (ref 2.5–4.6)
Potassium: 4.2 mmol/L (ref 3.5–5.1)
Potassium: 4.4 mmol/L (ref 3.5–5.1)
Sodium: 145 mmol/L (ref 135–145)
Sodium: 141 mmol/L (ref 135–145)

## 2019-05-27 LAB — APTT
aPTT: 30 seconds (ref 24–36)
aPTT: 53 seconds — ABNORMAL HIGH (ref 24–36)

## 2019-05-27 LAB — PROTIME-INR
INR: 2 — ABNORMAL HIGH (ref 0.8–1.2)
Prothrombin Time: 22.8 seconds — ABNORMAL HIGH (ref 11.4–15.2)

## 2019-05-27 LAB — CBC
HCT: 26.8 % — ABNORMAL LOW (ref 39.0–52.0)
Hemoglobin: 8.1 g/dL — ABNORMAL LOW (ref 13.0–17.0)
MCH: 30.8 pg (ref 26.0–34.0)
MCHC: 30.2 g/dL (ref 30.0–36.0)
MCV: 101.9 fL — ABNORMAL HIGH (ref 80.0–100.0)
Platelets: 392 10*3/uL (ref 150–400)
RBC: 2.63 MIL/uL — ABNORMAL LOW (ref 4.22–5.81)
RDW: 15.4 % (ref 11.5–15.5)
WBC: 14.8 10*3/uL — ABNORMAL HIGH (ref 4.0–10.5)
nRBC: 1 % — ABNORMAL HIGH (ref 0.0–0.2)

## 2019-05-27 LAB — GLUCOSE, CAPILLARY
Glucose-Capillary: 268 mg/dL — ABNORMAL HIGH (ref 70–99)
Glucose-Capillary: 191 mg/dL — ABNORMAL HIGH (ref 70–99)
Glucose-Capillary: 160 mg/dL — ABNORMAL HIGH (ref 70–99)
Glucose-Capillary: 234 mg/dL — ABNORMAL HIGH (ref 70–99)
Glucose-Capillary: 285 mg/dL — ABNORMAL HIGH (ref 70–99)
Glucose-Capillary: 318 mg/dL — ABNORMAL HIGH (ref 70–99)

## 2019-05-27 LAB — MAGNESIUM: Magnesium: 2.1 mg/dL (ref 1.7–2.4)

## 2019-05-27 LAB — PHOSPHORUS: Phosphorus: 3.9 mg/dL (ref 2.5–4.6)

## 2019-05-27 LAB — HEPATITIS B CORE ANTIBODY, TOTAL: Hep B Core Total Ab: NONREACTIVE

## 2019-05-27 LAB — CALCIUM, IONIZED: Calcium, Ionized, Serum: 4.8 mg/dL (ref 4.5–5.6)

## 2019-05-27 LAB — HEPATITIS B SURFACE ANTIBODY,QUALITATIVE: Hep B S Ab: REACTIVE — AB

## 2019-05-27 LAB — HEPATITIS B SURFACE ANTIGEN: Hepatitis B Surface Ag: NONREACTIVE

## 2019-05-27 MED ORDER — SODIUM CHLORIDE 0.9 % IV SOLN
2.0000 g | INTRAVENOUS | Status: AC
  Administered 2019-05-27 – 2019-05-31 (×5): 2 g via INTRAVENOUS
  Filled 2019-05-27 (×5): qty 2

## 2019-05-27 MED ORDER — WARFARIN SODIUM 4 MG PO TABS
4.0000 mg | ORAL_TABLET | Freq: Once | ORAL | Status: AC
  Administered 2019-05-27: 4 mg
  Filled 2019-05-27: qty 1

## 2019-05-27 MED ORDER — ZOLPIDEM TARTRATE 5 MG PO TABS
5.0000 mg | ORAL_TABLET | Freq: Every evening | ORAL | Status: DC | PRN
  Administered 2019-05-27 – 2019-06-03 (×8): 10 mg
  Administered 2019-06-07: 5 mg
  Administered 2019-06-09 – 2019-06-10 (×2): 10 mg
  Filled 2019-05-27 (×7): qty 2
  Filled 2019-05-27: qty 1
  Filled 2019-05-27 (×3): qty 2

## 2019-05-27 MED ORDER — ARIPIPRAZOLE 5 MG PO TABS
5.0000 mg | ORAL_TABLET | Freq: Every day | ORAL | Status: DC
  Administered 2019-05-28 – 2019-06-01 (×5): 5 mg
  Filled 2019-05-27 (×6): qty 1

## 2019-05-27 MED ORDER — WARFARIN SODIUM 4 MG PO TABS
4.0000 mg | ORAL_TABLET | Freq: Once | ORAL | Status: DC
  Filled 2019-05-27: qty 1

## 2019-05-27 MED ORDER — QUETIAPINE FUMARATE 25 MG PO TABS: 25.0000 mg | ORAL_TABLET | Freq: Every day | ORAL | Status: DC

## 2019-05-27 MED ORDER — ARIPIPRAZOLE 5 MG PO TABS
5.0000 mg | ORAL_TABLET | Freq: Every day | ORAL | Status: DC
  Administered 2019-05-27: 5 mg via ORAL
  Filled 2019-05-27: qty 1

## 2019-05-27 MED ORDER — HEPARIN SODIUM (PORCINE) 1000 UNIT/ML IJ SOLN
INTRAMUSCULAR | Status: AC
  Filled 2019-05-27: qty 1

## 2019-05-27 NOTE — Care Management (Signed)
Left message for Risk Management to discuss issue between mother and fiance regarding decision making.  Will follow with updates as available.    Reinaldo Raddle, RN, BSN  Trauma/Neuro ICU Case Manager [phone]

## 2019-05-27 NOTE — Progress Notes (Signed)
Physical Therapy Treatment Patient Details Name: Carlos Brewer. MRN: [id number] DOB: March 09, 1986 Today's Date: 05/27/2019    History of Present Illness Pt is a 34 y/o male smoker who initially presented on 2/5 with slurred speech and Rt sided weakness. Admitted with left MCA CVA and multiple smaller embolic infarcts s/p tPA and thrombectomy in IR.  Additionally found to have left ventricular  thrombus. Hospital course complicated by septic shock r/t HCAP, AKI requiring CRRT, polymorphic VT and cardiogenic shock. Echo EF < 20%. Pt with Intermittent pressor dependence, required tracheostomy. Tolerating trach collar 3/4, back on vent 3/5 with flash pulmonary edema.  Started IHD 3/10.    PT Comments    Patient fatigued today after having HD earlier and weaning on trach collar on vent for 3 hours.  Currently diaphoretic and per RN with fever.  Attempted to engage for standing transfer, but pt too fatigued/weak so assisted RN to use lift for back to bed.  Feel he likely will need longer acute level care due to requiring HD.  Likely will need LTACH initially.  PT to follow.    Follow Up Recommendations  LTACH     Equipment Recommendations  Other (comment)(to be determined)    Recommendations for Other Services       Precautions / Restrictions Precautions Precautions: Fall Precaution Comments: trach/vent/flexiseal    Mobility  Bed Mobility Overal bed mobility: Needs Assistance         Sit to supine: Total assist;+2 for physical assistance   General bed mobility comments: assist to supine in lift  Transfers Overall transfer level: Needs assistance               General transfer comment: bed to chair with lift due to pt too fatigued to attempt to use [facility]  Ambulation/Gait                 Stairs             Wheelchair Mobility    Modified Rankin (Stroke Patients Only) Modified Rankin (Stroke Patients Only) Pre-Morbid Rankin Score: No  symptoms Modified Rankin: Severe disability     Balance Overall balance assessment: Needs assistance   Sitting balance-Leahy Scale: Zero Sitting balance - Comments: today in recliner pushing back and not helping to lean forward to adjust lift pad Postural control: Posterior lean                                  Cognition Arousal/Alertness: Awake/alert Behavior During Therapy: Flat affect Overall Cognitive Status: Impaired/Different from baseline Area of Impairment: Attention;Following commands;Safety/judgement;Problem solving                   Current Attention Level: Focused   Following Commands: Follows one step commands inconsistently;Follows one step commands with increased time Safety/Judgement: Decreased awareness of safety;Decreased awareness of deficits   Problem Solving: Slow processing General Comments: restless in chair scooting out and not assisting wtih mobility      Exercises      General Comments        Pertinent Vitals/Pain Pain Assessment: Faces Faces Pain Scale: Hurts little more Pain Location: generalized Pain Descriptors / Indicators: Grimacing;Discomfort Pain Intervention(s): Monitored during session;Repositioned    Home Living                      Prior Function  PT Goals (current goals can now be found in the care plan section) Progress towards PT goals: Not progressing toward goals - comment    Frequency    Min 3X/week      PT Plan Discharge plan needs to be updated;Frequency needs to be updated    Co-evaluation              AM-PAC PT "6 Clicks" Mobility   Outcome Measure  Help needed turning from your back to your side while in a flat bed without using bedrails?: Total Help needed moving from lying on your back to sitting on the side of a flat bed without using bedrails?: Total Help needed moving to and from a bed to a chair (including a wheelchair)?: Total Help needed standing  up from a chair using your arms (e.g., wheelchair or bedside chair)?: Total Help needed to walk in hospital room?: Total Help needed climbing 3-5 steps with a railing? : Total 6 Click Score: 6    End of Session Equipment Utilized During Treatment: Other (comment)(vent) Activity Tolerance: Patient limited by fatigue Patient left: in bed;with family/visitor present;with nursing/sitter in room   PT Visit Diagnosis: Other abnormalities of gait and mobility (R26.89);Muscle weakness (generalized) (M62.81);Other symptoms and signs involving the nervous system (X90.240)     Time: 9735-3299 PT Time Calculation (min) (ACUTE ONLY): 21 min  Charges:  $Therapeutic Activity: 8-22 mins                     Magda Kiel, [place] [facility] [phone] 05/27/2019    Reginia Naas 05/27/2019, 5:33 PM

## 2019-05-27 NOTE — Progress Notes (Signed)
PHARMACY - PHYSICIAN COMMUNICATION CRITICAL VALUE ALERT - BLOOD CULTURE IDENTIFICATION (BCID)  Carlos Brewer. is an 34 y.o. male who presented to [facility] on 04/27/2019 with a chief complaint of left MCA CVA, subsequently developed AKI requiring CRRT.  Assessment:  1/4 aerobic bottle GPC  Name of physician (or Provider) Contacted: Dr. Loanne Drilling  Current antibiotics: Ceftriaxone 2 gm IV q24h  Changes to prescribed antibiotics recommended:  Could be contaminant, but given pt's complicated hospital course, fevers, and leukocytosis, could be real as well. Pt is colonized with MRSA. F/u cxs, could consider adding vanc.  No results found for this or any previous visit.  Berenice Bouton, PharmD PGY1 Pharmacy Resident  Please check AMION for all [place] phone numbers After 10:00 PM, call [place] [phone] 05/27/2019  12:53 PM

## 2019-05-27 NOTE — Progress Notes (Signed)
Nutrition Follow-up  DOCUMENTATION CODES:   Obesity unspecified  INTERVENTION:   Continue:  Vital 1.5 @ 60 ml/hr (1440 ml/day) via Cortrak tube 60 ml Prostat BID  Provides: 2560 kcal, 157 grams protein, and 1100 ml free water.    NUTRITION DIAGNOSIS:   Inadequate oral intake related to inability to eat as evidenced by NPO status. Ongoing.   GOAL:   Patient will meet greater than or equal to 90% of their needs Meeting with TF.   MONITOR:   TF tolerance  REASON FOR ASSESSMENT:   Ventilator   ASSESSMENT:   Pt with PMH of CHF, OSA, poorly controlled DM, cocaine abuse admitted with L MCA stroke s/p tPA and IR thrombectomy.   Pt discussed during ICU rounds and with RN.  Pt back on vent support via trach. Per MD at rounds will likely need LTACH to help with weaning. Renal following, pt now on iHD.   2/6 started on CRRT  2/18 s/p trach placement 2/19 Cortrak placement (replaced 3/12)  3/10 CRRT stopped 3/12 iHD  MV: 14.4 L/min Temp (24hrs), Avg:100.6 F (38.1 C), Min:97.7 F (36.5 C), Max:103.2 F (39.6 C)   Medications reviewed and include:  5 units novolog every 4 hours, 27 units levemir BID  Labs reviewed: BUN/Cr: 98/6.39 (H) CBGs: 191-160   UOP: 975 x 24 hrs HD: 2507  Diet Order:   Diet Order    None      EDUCATION NEEDS:   No education needs have been identified at this time  Skin:  Skin Assessment: Skin Integrity Issues: Skin Integrity Issues:: Stage II, Other (Comment) Stage II: R buttocks, penis Other: open wound buttocks  Last BM:  3/11 x 2 large via rectal tube  Height:   Ht Readings from Last 1 Encounters:  05/11/2019 6\' 1"  (1.854 m)    Weight:   Wt Readings from Last 1 Encounters:  05/27/19 104.8 kg    Ideal Body Weight:  83.6 kg  BMI:  Body mass index is 30.48 kg/m.  Estimated Nutritional Needs:   Kcal:  2500 up to 2800 with fevers  Protein:  150-170 grams  Fluid:  2 L/day  Lockie Pares., RD, LDN, CNSC See AMiON  for contact information

## 2019-05-27 NOTE — Care Management (Signed)
Spoke with Lattie Haw in Risk Management regarding issues with decision making between mother and fiance:  Per H&P documentation by MD on 05/08/2019, pt indicated that Elsie Amis, his fiance, should make decisions for him if he became unable to.  On 05/22/19, two bedside RNs witnessed pt nodding yes when asked if he would like his mother to make his medical decisions.    Apparently pt's mother and fiance have had a disagreement, and now pt's mother has changed the password, no longer allowing fiance to receive any medical information.  Fiance is obviously upset by this, as she has been able to receive information for most of pt's hospitalization.     Lattie Haw with Risk Management feels that decision making should defer to mother at this time, and attending MD needs to be contacted to weigh in on whether or not they feel pt has capacity to make decisions for himself.  I have updated bedside nurse on Risk Management input.  Will continue to follow/assist as needed.   Reinaldo Raddle, RN, BSN  Trauma/Neuro ICU Case Manager [phone]

## 2019-05-27 NOTE — Progress Notes (Addendum)
Inpatient Diabetes Program Recommendations  AACE/ADA: New Consensus Statement on Inpatient Glycemic Control (2015)  Target Ranges:  Prepandial:   less than 140 mg/dL      Peak postprandial:   less than 180 mg/dL (1-2 hours)      Critically ill patients:  140 - 180 mg/dL   Results for MACAULAY, REICHER (MRN [id number]) as of 05/27/2019 07:31  Ref. Range 05/25/2019 23:46 05/26/2019 03:33 05/26/2019 08:22 05/26/2019 11:45 05/26/2019 15:57 05/26/2019 19:33  Glucose-Capillary Latest Ref Range: 70 - 99 mg/dL 168 (H)  9 units NOVOLOG  186 (H)  9 units NOVOLOG  220 (H)  12 units NOVOLOG  262 (H)  16 units NOVOLOG +  27 units LEVEMIR  194 (H)  9 units NOVOLOG  238 (H)  12 units NOVOLOG +  27 units LEVEMIR    Results for NOA, CONSTANTE (MRN [id number]) as of 05/27/2019 07:31  Ref. Range 05/26/2019 23:20 05/27/2019 03:50  Glucose-Capillary Latest Ref Range: 70 - 99 mg/dL 287 (H)  16 units NOVOLOG  268 (H)  16 units NOVOLOG     Home DM Meds: Levemir 40 units QHS       Novolog 10 units TI  Current Orders: Levemir 27 units BID      Novolog Resistant Correction Scale/ SSI (0-20 units) Q4 hours      Novolog 5 units Q4 hours     MD- Note patient still getting Continuous Tube Feedings at 60cc/hr.  Note that CBGs have been on the rise since yesterday (03/11) afternoon.  May consider the following:  1. Increase Levemir slightly to 30 units BID  2. Increase Novolog Tube Feed Coverage to: Novolog 8 units Q4 hours     --Will follow patient during hospitalization--  Wyn Quaker RN, MSN, CDE Diabetes Coordinator Inpatient Glycemic Control Team Team Pager: [phone] (8a-5p)

## 2019-05-27 NOTE — Progress Notes (Addendum)
ANTICOAGULATION CONSULT NOTE  Pharmacy Consult:  Bivalirudin + add Coumadin Indication: stroke 2/5, LV thrombus 2/8, HIT 2/15   Allergies  Allergen Reactions  . Heparin Other (See Comments)    Heparin induced thrombocytopenia. 2/15 HIT OD 1.692. 2/16 SRA positive-90.     Patient Measurements: Height: 6\' 1"  (185.4 cm) Weight: 236 lb 8.9 oz (107.3 kg) IBW/kg (Calculated) : 79.9   Vital Signs: Temp: 99.4 F (37.4 C) (03/12 0830) Temp Source: Axillary (03/12 0830) BP: 110/83 (03/12 0845) Pulse Rate: 106 (03/12 0845)  Labs: Recent Labs    05/25/19 0133 05/25/19 1045 05/26/19 0337 05/26/19 1042 05/26/19 1535 05/27/19 0543  HGB 7.3*  --  7.9*  --   --  8.1*  HCT 23.6*  --  25.9*  --   --  26.8*  PLT 315  --  381  --   --  392  APTT 47*   < > 49* 53*  --  30  LABPROT 17.0*  --  17.4*  --   --  22.8*  INR 1.4*  --  1.4*  --   --  2.0*  CREATININE 5.90*   < > 4.97*  --  5.90* 6.39*   < > = values in this interval not displayed.    Estimated Creatinine Clearance: 20.9 mL/min (A) (by C-G formula based on SCr of 6.39 mg/dL (H)).  Assessment: 34 yr old male with left MCA infarct - received TPA and revascularization with IR on 2/5. On 2/8 found to have small LV apical thrombus on ECHO. Pharmacy consulted to dose IV heparin, which was switched to bivalirudin 2/15 for HIT. 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 SRA very positive at 51. Heparin allergy has appropriately been added to chart and updated with SRA results.   APTT was subtherapeutic at 30 seconds at 0.07 mg/kg/hr this morning, unlikely drop with how consistent the aPTTs have been. aPTT was rechecked and found to be therapeutic at 53 seconds; no bleeding reported. Hemoglobin is low but stable at 8.1; nephrology has scheduled Aranesp every Monday. Platelet count WNL.  INR is now 2.0 after three days of warfarin 5 mg (falsely elevated due to bivalirudin). Will continue bivalirudin infusion  at 0.07 mg/kg/hr. Will stop bivalirudin when INR > 3.0 and check INR and aPTT 4-6 hr later.  1. If INR at goal (2-3)         a. If aPTT > 40: Assume prolonged bivalirudin clearance and  repeat aPTT and INR in 4-6 hrs.         b. If aPTT < 40: Begin warfarin alone 2. INR subtherapeutic: Resume bivalirudin  Goal of Therapy:  APTT 50-65 sec  Monitor platelets by anticoagulation protocol: Yes   Plan:  - Continue bivalirudin infusion to 0.07 mg/kg/hr for at least 24 more hours - Warfarin 4 mg x1 this evening - Daily aPTT, CBC, INR - Monitor for s/sx of bleeding  [name], PharmD PGY1 Pharmacy Resident

## 2019-05-27 NOTE — TOC Progression Note (Addendum)
Transition of Care (TOC) - Progression Note    Patient Details  Name: Carlos Brewer. MRN: [id number] Date of Birth: 1985-07-05  Transition of Care [facility]) CM/SW Contact  Carlos Median, LCSW Phone Number: 05/27/2019, 2:35 PM  Clinical Narrative:    Update 2:50pm  Carlos Brewer states that the CEO will have to review clinical information on Monday 3/15.    CSW informed in rounds that patient is nearing discharge to [facility]. Patient's family(Mother Carlos Brewer) is aware and agreeable to this plan. Patient has [facility] as primary and only payor source.   CSW spoke with [facility] and they are in network with [place]. Carlos Brewer is reviewing patient to see if he is appropriate for their facility. Carlos Brewer will follow up with CSW once she has reviewed medical chart.   TOC will continue to follow.   Expected Discharge Plan: Long Term Acute Care (LTAC) Barriers to Discharge: Continued Medical Work up  Expected Discharge Plan and Services Expected Discharge Plan: [place] (LTAC)                                               Social Determinants of Health (SDOH) Interventions    Readmission Risk Interventions No flowsheet data found.

## 2019-05-27 NOTE — Progress Notes (Signed)
[facility] Progress Note    Assessment/ Plan:   1. AKIpresumably due to ATN with ischemic/nephrotoxic insults worsened by cardiogenic shock. Started on CRRT 04/23/19-  3/7.   Tolerated IHD on 3/10 and today - removed 3 liters.  I guess will keep on MWF schedule.  Now that he has tolerated IHD- thi issue is with trach will need LTACH  2. Cardiogenic shock- Hx milrinone due to development of a fib with rvr and wide complex tachycardia. -Known severe NICM with EF <20% andmay not tolerate  IHD but seems to be so far   3. Acute stroke- presumed embolic with bilateral infarcts and LV thrombus- received tpa on admission 4. SVT/polymorphic VT- per Cardiology 5. Acute hypoxic respiratory failure- s/p trach  6. 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 HIT Ab+on Argatroban. 7. HTN/vol-  BP low-  Does not appear to have significant volume on board, CCM tells me he is much above his EDW-  By weights seems to be on the lower side- no pul edema by x-ray 3/11 - on max dose midodrine - UF as able with HD 8. Anemia-   iron stores low, got feraheme on 3/9 and 3/12,  - also  giving ESA - darbe 100 per week 9. Bones-  Phos ok right now-  No binder-   intact PTH 70 10. Disposition- overall prognosis is poor with ongoing RRT , trach, as well as cardiogenic shock/cardiomyopathy.  Whether or not he tolerates IHD will be a big step- seems to have passed that step but with trach and MS the way it is-  now the only option for HD out of hospital is LTACH  Subjective:   975 UOP - BUN and crt worse again -  Alert but difficult to tell how much information he is taking in. Just finishing HD today - removed 3000   Objective:   BP 106/84   Pulse (!) 102   Temp (!) 100.7 F (38.2 C)   Resp (!) 21   Ht _0  (1.854 m)   Wt 107.3 kg   SpO2 100%   BMI 31.21 kg/m   Intake/Output Summary (Last 24 hours) at 05/27/2019 1128 Last data filed at 05/27/2019 1100 Gross per 24 hour  Intake 2251.08 ml   Output 1200 ml  Net 1051.08 ml   Weight change: -2.3 kg  Physical Exam: Gen: awake in bed, trach collar, nonverbal. Follows commands but distracted  HEENT - trach in place CVS: S1S2 no rub Resp:reduced breath sounds Abd: soft/ND Ext: he has no edema Access: rightIJtunneled catheter  Imaging: DG CHEST PORT 1 VIEW  Result Date: 05/26/2019 CLINICAL DATA:  34 year old male with history of fever. EXAM: PORTABLE CHEST 1 VIEW COMPARISON:  Chest x-ray 05/25/2019. FINDINGS: A tracheostomy tube is in place with tip 3.7 cm above the carina. Right IJ PermCath with tip terminating at the superior cavoatrial junction. An endotracheal tube is in place with tip lung volumes are slightly low. Ill-defined opacity in the left base partially obscuring the left hemidiaphragm concerning for left lower lobe consolidative airspace disease. No definite pleural effusions. No evidence of pulmonary edema. Heart size is mildly enlarged. Upper mediastinal contours are within normal limits. IMPRESSION: 1. Support apparatus, as above. 2. Consolidative airspace disease in the left lower lobe concerning for pneumonia. 3. Mild cardiomegaly. Electronically Signed   By: Vinnie Langton M.D.   On: 05/26/2019 09:50   DG Abd Portable 1V  Result Date: 05/25/2019 CLINICAL DATA:  Check gastric catheter  placement EXAM: PORTABLE ABDOMEN - 1 VIEW COMPARISON:  Film from earlier in the same day. FINDINGS: Previously seen feeding catheter in the mid esophagus has been removed. New nasogastric catheter has been advanced into the stomach. IMPRESSION: Gastric catheter within the stomach. Electronically Signed   By: Inez Catalina M.D.   On: 05/25/2019 22:16   DG Abd Portable 1V  Result Date: 05/25/2019 CLINICAL DATA:  Check gastric catheter placement EXAM: PORTABLE ABDOMEN - 1 VIEW COMPARISON:  05/25/2019 FINDINGS: Tracheostomy tube and right jugular central line are again noted and stable. Cardiomegaly is again seen. Previously seen  feeding catheter has been withdrawn and now lies in the distal esophagus. This should be advanced several cm. IMPRESSION: Feeding catheter within the mid esophagus. This should be advanced several cm into the stomach. Electronically Signed   By: Inez Catalina M.D.   On: 05/25/2019 21:26    Labs: BMET Recent Labs  Lab 05/24/19 0520 05/24/19 1830 05/25/19 0133 05/25/19 1626 05/26/19 0337 05/26/19 1535 05/27/19 0543  NA 145 144 145 140 141 145 145  K 3.6 3.6 3.6 4.2 4.5 4.4 4.2  CL 91* 93* 95* 98 99 102 100  CO2 37* 35* 34* _0 GLUCOSE 190* 187* 196* 229* 203* 216* 267*  BUN 60* 72* 80* 44* 63* 79* 98*  CREATININE 5.34* 5.78* 5.90* 3.93* 4.97* 5.90* 6.39*  CALCIUM 9.8 9.2 9.2 8.9 9.2 9.3 8.9  PHOS 3.4 4.0 3.7 2.8 3.5 4.1 3.8  3.9   CBC Recent Labs  Lab 05/24/19 0920 05/25/19 0133 05/26/19 0337 05/27/19 0543  WBC 9.6 8.1 11.0* 14.8*  HGB 7.6* 7.3* 7.9* 8.1*  HCT 24.9* 23.6* 25.9* 26.8*  MCV 100.8* 99.6 101.2* 101.9*  PLT 329 315 381 392    Medications:    . amiodarone  200 mg Per Tube BID  . ARIPiprazole  5 mg Oral Daily  . aspirin  81 mg Per Tube Daily  . atorvastatin  40 mg Per Tube q1800  . chlorhexidine gluconate (MEDLINE KIT)  15 mL Mouth Rinse BID  . Chlorhexidine Gluconate Cloth  6 each Topical Q0600  . Chlorhexidine Gluconate Cloth  6 each Topical Q0600  . darbepoetin (ARANESP) injection - NON-DIALYSIS  100 mcg Subcutaneous Q Mon-1800  . feeding supplement (PRO-STAT SUGAR FREE 64)  60 mL Per Tube BID  . FLUoxetine  20 mg Per Tube Daily  . heparin      . insulin aspart  0-20 Units Subcutaneous Q4H  . insulin aspart  5 Units Subcutaneous Q4H  . insulin detemir  27 Units Subcutaneous BID  . mouth rinse  15 mL Mouth Rinse 10 times per day  . midodrine  15 mg Per Tube TID WC  . mupirocin ointment  1 application Nasal BID  . pantoprazole sodium  40 mg Per Tube Q1200  . sodium chloride flush  10-40 mL Intracatheter Q12H  . warfarin  4 mg Oral ONCE-1800   . Warfarin - Pharmacist Dosing Inpatient   Does not apply q1800      Louis Meckel  05/27/2019, 11:28 AM

## 2019-05-27 NOTE — Procedures (Signed)
Cortrak  Tube Type:  Cortrak - 43 inches Tube Location:  Left nare Initial Placement:  Stomach Secured by: Bridle Technique Used to Measure Tube Placement:  Documented cm marking at nare/ corner of mouth Cortrak Secured At:  70 cm    Cortrak Tube Team Note:  Consult received to place a Cortrak feeding tube.   No x-ray is required. RN may begin using tube.   If the tube becomes dislodged please keep the tube and contact the Cortrak team at [email or web address] ([email or web address]) for replacement.  If after hours and replacement cannot be delayed, place a NG tube and confirm placement with an abdominal x-ray.    Koleen Distance MS, RD, LDN Contact information available in Amion

## 2019-05-27 NOTE — Progress Notes (Signed)
SLP Cancellation Note  Patient Details Name: Carlos Brewer. MRN: [id number] DOB: 1985/11/29   Cancelled treatment:       Reason Eval/Treat Not Completed: Medical issues which prohibited therapy - on vent. Discussed overall tx plan with RN including consideration for inline PMV. She recommends holding for today. Will f/u as able.    Osie Bond., M.A. [place] Pager [phone] Office [phone]  05/27/2019, 12:07 PM

## 2019-05-27 NOTE — Progress Notes (Addendum)
NAME:  Carlos Ronan., MRN:  [id number], DOB:  [birth year], LOS: 73 ADMISSION DATE:  04/25/2019, CONSULTATION DATE:  04/27/2019 REFERRING MD:  Dr. Leonel Ramsay, CHIEF COMPLAINT:  Slurred speech  Brief History   34 yo male smoker found to have slurred speech and Rt sided weakness.  Admitted with left MCA CVA and multiple smaller embolic infarcts s/p tPA and thrombectomy in IR.  Additionally found to have left ventricular  thrombus.    Course complicated by septic shock r/t HCAP, AKI requiring CRRT, polymorphic VT and cardiogenic shock.  Intermittent pressor dependence, required tracheostomy  Past Medical History  Systolic CHF with non ischemic CM, Cocaine abuse, OSA, DM Hx of CHF (EF 15%)  Hx myocarditis Smoker 1/2 ppd   Significant Hospital Events   2/05 Admit, tPA, IR thrombectomy 2/6 100% on fvent at 2300  Overnight requiring increasing levophed and phenylephrine.    2/6: switched to levophed, epi, started antibiotics, started on CRRT.  2/9  developed wide-complex tachycardia and hypotension, started on amiodarone drip, increase Levophed drip 2/15 drop in platelets, heparin stopped, bival started 2/17 Hypotensive and back on Levophed 2/18 trach, lines changed 2/19 started milrinone , fever 103 2/20 Episode of wide-complex tachycardia yesterday, changed from Levophed to vasopressin 2/22 stopping vanc. Still on inotrope support. Some AF w/ RVR. Had to be placed back on pressors. ivabradine added 2/23 still on pressors/ CRRT continued 2/24 tmax 98.4, Remains on CRRT, even UF, remains anuric, Levophed at 14 mcg/min, Milrinone at 0.125 mcg/kg/min, Coox 63.4 Doing well this morning on ATC, no events overnight.  Midodrine added   2/25 more interactive, remains on ATC, tmax 99.5/ WBC 21.4, ~900 ml liquid stool/ 24 hours, neg Cdiff, Levophed up to 20 mcg/min, CVP 2, ,milrinone remains at 0.125 mcg/kg/min, coox 92.7, Off CRRT since last night ~2000 s/p clotted x 3 off citrate, restarted    3/1 Amio drip restarted for WCT/atrial flutter, milrinone turned off 3/2 CRRT stopped, permacath placed 3/5 crrt resumed 3/6 started on pressors for crrt tolerance 3/8 No longer on CRRT or pressor support   Consults:  Neuro IR Cardiology  Nephrology Heart Failure  EP   Procedures:  2/5-2/6:  TPA given at 428 am (total of 90 Mg) 520 am went to IR  S/P Lt common carotid arteriogram followed by complete revascularization of occluded LT MCA sup division mid M2 seg with x 1 pass with 47mx 40 mm solitaire X ret river device and penumbra aspiration with TICI 3 revascularization  ETT 2/05 >> 2/18 LIJ CVL 2/5 >>2/18  RT [place] CVL 2/18 >>3/2 RIJ HD cath 2/6 >>2/18 6 shiley cuffed trach 2/18  >>3/4  RT fem HD 2/19 >>3/2 RIJ permacath 3/2>> 3/4 shiley 4 uncuffed.... dislodged placed back 6cuffed shiley that evening 3/5: change to #6 cuffed distal XLT  Significant Diagnostic Tests:  CT angio head/neck 2/05 >> occlusion of Lt MCA bifurcation Echo 2/05 >> EF less than 20%, cannot rule out apical thrombus MRI brain 2/11 > extensive acute infarction of multiple areas without large or medium vessel occlusion Echocardiogram 2/8 Left ventricular ejection fraction, by estimation, is <20%. The left  ventricle has severely decreased function. The left ventricle demonstrates  global hypokinesis. The left ventricular internal cavity size was severely  dilated. Possible small 0.8 x  0.6 cm apical thrombus. Somewhat subtle finding, could confirm with  cardiac MRI. However, if patient has had CVA, would be reasonable to  anticoagulate given severity of LV dysfunction if no contraindications to  anticoagulation.   Micro Data:  SARS CoV2 PCR 2/05 >> negative Influenza PCR 2/05 >> negative Urine 2/6 >> ng resp 2/6 >> nml flora BC 2/6 >>ng resp 2/16 >> ng [facility] 2/19 >> negative  BC x 2 2/25 >>ng Cdiff 2/25 >> neg  Antimicrobials:  mero 2/6 -2/12 Zosyn 2/6 , 2/19 >> 2/23 vanc 2/6 >> 2/9 , 2/19  >>2/22  Interim history/subjective:  Tmax 103.2 in the last 24 hours. Minimal vent settings however agitated when stimulated. On hemodialysis  Objective   Blood pressure (!) 114/93, pulse (!) 114, temperature (!) 100.7 F (38.2 C), resp. rate (!) 27, height 6' 1"  (1.854 m), weight 107.3 kg, SpO2 100 %.    Vent Mode: PCV FiO2 (%):  [40 %] 40 % Set Rate:  [20 bmp] 20 bmp PEEP:  [5 cmH20] 5 cmH20 Plateau Pressure:  [23 cmH20-30 cmH20] 28 cmH20   Intake/Output Summary (Last 24 hours) at 05/27/2019 0931 Last data filed at 05/27/2019 0800 Gross per 24 hour  Intake 2077.34 ml  Output 1140 ml  Net 937.34 ml   Filed Weights   05/26/19 0500 05/27/19 0500 05/27/19 0730  Weight: 106.4 kg 108.5 kg 107.3 kg   Physical Exam: General: Chronically ill-appearing, sleeping HENT: [place], AT, OP clear, MMM Neck: Cuffed trach #6 XLT in place, c/d/i Eyes: EOMI, no scleral icterus Respiratory: Clear to auscultation bilaterally.  No crackles, wheezing or rales Cardiovascular: RRR, -M/R/G, no JVD GI: BS+, soft, nontender Extremities:-Edema,-tenderness Neuro: Awake, does not follow commands, moves extremities x4  Resolved Hospital Problem list   HCAP 9/67, Acute metabolic encephalopathy 2nd to hypoxia and renal failure, Cardiogenic shock  Assessment & Plan:   Hyperactive delirium  Start low-dose seroquel  Acute hypoxic, hypercapnic respiratory failure with compromised airway in setting of CVA. Failure to wean from vent s/p tracheostomy. Remains CFB +13L On full vent support PSV as tolerated however his volume status and agitation is limiting his progress Now with a #6 XLT trach, following with speech therapy Continue routine trach care PRN suctioning  Follow intermittent chest x-ray Volume removal per diaysis  Acute on chronic systolic CHF, non-ischemic.  Course complicated by cardiogenic shock now improved Polymorphic VT. Apical thrombus in left ventricle. HLD. Dialysis per  Nephrology Continue amiodarone, aspirin, Lipitor, ivabradine Transition from bivalirudin to warfarin  Fever, leukocytosis secondary suspected aspiration/CAP CXR with LLL infiltrate F/u trach aspirate and blood cultures PRN tylenol Start ceftriaxone x 5 days  Lt MCA CVA likely embolic. Hx of cocaine abuse, depression. Prozac as ordered PT/OT  AKI from ATN in setting of hypoxia, cardiogenic shock. Off CVVH since 3/7. Tolerating iHD on 3/10.   Appreciate nephrology assistance.  Currently +13L since admission Follow urine output, BMP Will need to involve case management regarding trach/HD placement options  HIT. Anemia of critical illness. - heparin induced Plt Ab 1.692 from 05/02/19 - SRA positive, 90% from 05/03/19 Transition from bivalirudin to warfarin Follow CBC Conservative transfusion goal hemoglobin 7  DM type II poorly controlled with hyperglycemia. Sign scale insulin per protocol Levemir  Insomnia. Ambien as ordered  Best practice:  Diet: NPO,  TFs DVT prophylaxis: SCDs/ bivalirudin GI prophylaxis: protonix Mobility: progress when able Code Status: full code Disposition: ICU Family: Updated mother 3/12   Labs:   CMP Latest Ref Rng & Units 05/27/2019 05/26/2019 05/26/2019  Glucose 70 - 99 mg/dL 267(H) 216(H) 203(H)  BUN 6 - 20 mg/dL 98(H) 79(H) 63(H)  Creatinine 0.61 - 1.24 mg/dL 6.39(H) 5.90(H) 4.97(H)  Sodium 135 -  145 mmol/L 145 145 141  Potassium 3.5 - 5.1 mmol/L 4.2 4.4 4.5  Chloride 98 - 111 mmol/L 100 102 99  CO2 22 - 32 mmol/L 24 26 25   Calcium 8.9 - 10.3 mg/dL 8.9 9.3 9.2  Total Protein 6.5 - 8.1 g/dL 7.5 - -  Total Bilirubin 0.3 - 1.2 mg/dL 1.0 - -  Alkaline Phos 38 - 126 U/L 210(H) - -  AST 15 - 41 U/L 37 - -  ALT 0 - 44 U/L 19 - -    CBC Latest Ref Rng & Units 05/27/2019 05/26/2019 05/25/2019  WBC 4.0 - 10.5 K/uL 14.8(H) 11.0(H) 8.1  Hemoglobin 13.0 - 17.0 g/dL 8.1(L) 7.9(L) 7.3(L)  Hematocrit 39.0 - 52.0 % 26.8(L) 25.9(L) 23.6(L)   Platelets 150 - 400 K/uL 392 381 315    ABG    Component Value Date/Time   PHART 7.335 (L) 05/21/2019 0427   PCO2ART 59.2 (H) 05/21/2019 0427   PO2ART 165.0 (H) 05/21/2019 0427   HCO3 31.5 (H) 05/21/2019 0427   TCO2 43 (H) 05/22/2019 2019   ACIDBASEDEF 3.0 (H) 05/04/2019 2218   O2SAT 90.3 05/26/2019 1042    CBG (last 3)  Recent Labs    05/26/19 2320 05/27/19 0350 05/27/19 0836  GLUCAP 287* 268* 191*    The patient is critically ill with multiple organ systems failure and requires high complexity decision making for assessment and support, frequent evaluation and titration of therapies, application of advanced monitoring technologies and extensive interpretation of multiple databases.   Critical Care Time devoted to patient care services described in this note is 31 Minutes.  Rodman Pickle, M.D. [facility] Pulmonary/Critical Care Medicine 05/27/2019 9:31 AM   Please see Amion for pager number to reach on-call Pulmonary and Critical Care Team.

## 2019-05-28 DIAGNOSIS — J9601 Acute respiratory failure with hypoxia: Secondary | ICD-10-CM | POA: Diagnosis not present

## 2019-05-28 DIAGNOSIS — Z930 Tracheostomy status: Secondary | ICD-10-CM | POA: Diagnosis not present

## 2019-05-28 DIAGNOSIS — N170 Acute kidney failure with tubular necrosis: Secondary | ICD-10-CM | POA: Diagnosis not present

## 2019-05-28 LAB — RENAL FUNCTION PANEL
Albumin: 2.9 g/dL — ABNORMAL LOW (ref 3.5–5.0)
Anion gap: 20 — ABNORMAL HIGH (ref 5–15)
BUN: 75 mg/dL — ABNORMAL HIGH (ref 6–20)
CO2: 23 mmol/L (ref 22–32)
Calcium: 8.5 mg/dL — ABNORMAL LOW (ref 8.9–10.3)
Chloride: 98 mmol/L (ref 98–111)
Creatinine, Ser: 5.43 mg/dL — ABNORMAL HIGH (ref 0.61–1.24)
GFR calc Af Amer: 15 mL/min — ABNORMAL LOW (ref >60)
GFR calc non Af Amer: 13 mL/min — ABNORMAL LOW (ref >60)
Glucose, Bld: 303 mg/dL — ABNORMAL HIGH (ref 70–99)
Phosphorus: 4.5 mg/dL (ref 2.5–4.6)
Potassium: 4.3 mmol/L (ref 3.5–5.1)
Sodium: 141 mmol/L (ref 135–145)

## 2019-05-28 LAB — CULTURE, BLOOD (ROUTINE X 2): Special Requests: ADEQUATE

## 2019-05-28 LAB — CBC
HCT: 29.6 % — ABNORMAL LOW (ref 39.0–52.0)
Hemoglobin: 9.2 g/dL — ABNORMAL LOW (ref 13.0–17.0)
MCH: 31.3 pg (ref 26.0–34.0)
MCHC: 31.1 g/dL (ref 30.0–36.0)
MCV: 100.7 fL — ABNORMAL HIGH (ref 80.0–100.0)
Platelets: 332 10*3/uL (ref 150–400)
RBC: 2.94 MIL/uL — ABNORMAL LOW (ref 4.22–5.81)
RDW: 15.5 % (ref 11.5–15.5)
WBC: 16.6 10*3/uL — ABNORMAL HIGH (ref 4.0–10.5)
nRBC: 1.6 % — ABNORMAL HIGH (ref 0.0–0.2)

## 2019-05-28 LAB — APTT: aPTT: 64 seconds — ABNORMAL HIGH (ref 24–36)

## 2019-05-28 LAB — GLUCOSE, CAPILLARY
Glucose-Capillary: 216 mg/dL — ABNORMAL HIGH (ref 70–99)
Glucose-Capillary: 224 mg/dL — ABNORMAL HIGH (ref 70–99)
Glucose-Capillary: 256 mg/dL — ABNORMAL HIGH (ref 70–99)
Glucose-Capillary: 281 mg/dL — ABNORMAL HIGH (ref 70–99)
Glucose-Capillary: 285 mg/dL — ABNORMAL HIGH (ref 70–99)
Glucose-Capillary: 308 mg/dL — ABNORMAL HIGH (ref 70–99)

## 2019-05-28 LAB — MAGNESIUM: Magnesium: 2.1 mg/dL (ref 1.7–2.4)

## 2019-05-28 LAB — PROTIME-INR
INR: 2.7 — ABNORMAL HIGH (ref 0.8–1.2)
Prothrombin Time: 28.3 seconds — ABNORMAL HIGH (ref 11.4–15.2)

## 2019-05-28 MED ORDER — WARFARIN SODIUM 3 MG PO TABS
3.0000 mg | ORAL_TABLET | Freq: Once | ORAL | Status: AC
  Administered 2019-05-28: 18:00:00 3 mg
  Filled 2019-05-28: qty 1

## 2019-05-28 MED ORDER — LIP MEDEX EX OINT
TOPICAL_OINTMENT | CUTANEOUS | Status: DC | PRN
  Filled 2019-05-28: qty 7

## 2019-05-28 MED ORDER — FENTANYL CITRATE (PF) 100 MCG/2ML IJ SOLN
50.0000 ug | INTRAMUSCULAR | Status: DC | PRN
  Administered 2019-05-28: 100 ug via INTRAVENOUS
  Administered 2019-05-28: 50 ug via INTRAVENOUS
  Administered 2019-05-28 – 2019-06-11 (×26): 100 ug via INTRAVENOUS
  Administered 2019-06-11: 50 ug via INTRAVENOUS
  Filled 2019-05-28 (×30): qty 2

## 2019-05-28 MED ORDER — INSULIN DETEMIR 100 UNIT/ML ~~LOC~~ SOLN
35.0000 [IU] | Freq: Two times a day (BID) | SUBCUTANEOUS | Status: DC
  Administered 2019-05-28 – 2019-05-29 (×2): 35 [IU] via SUBCUTANEOUS
  Filled 2019-05-28 (×3): qty 0.35

## 2019-05-28 NOTE — Progress Notes (Signed)
ANTICOAGULATION CONSULT NOTE  Pharmacy Consult:  Bivalirudin + add Coumadin Indication: stroke 2/5, LV thrombus 2/8, HIT 2/15   Allergies  Allergen Reactions  . Heparin Other (See Comments)    Heparin induced thrombocytopenia. 2/15 HIT OD 1.692. 2/16 SRA positive-90.     Patient Measurements: Height: 6\' 1"  (185.4 cm) Weight: 231 lb 0.7 oz (104.8 kg) IBW/kg (Calculated) : 79.9   Vital Signs: Temp: 98.5 F (36.9 C) (03/13 0800) Temp Source: Oral (03/13 0800) BP: 117/91 (03/13 0800) Pulse Rate: 119 (03/13 0800)  Labs: Recent Labs    05/26/19 0337 05/26/19 1042 05/27/19 0543 05/27/19 0852 05/27/19 1653 05/28/19 0349  HGB 7.9*  --  8.1*  --   --  9.2*  HCT 25.9*  --  26.8*  --   --  29.6*  PLT 381  --  392  --   --  332  APTT 49*   < > 30 53*  --  64*  LABPROT 17.4*  --  22.8*  --   --  28.3*  INR 1.4*  --  2.0*  --   --  2.7*  CREATININE 4.97*   < > 6.39*  --  4.06* 5.43*   < > = values in this interval not displayed.    Estimated Creatinine Clearance: 24.4 mL/min (A) (by C-G formula based on SCr of 5.43 mg/dL (H)).  Assessment: 34 yr old male with history of ESRD on HD presented with left MCA infarct - received TPA and revascularization with IR on 2/5. On 2/8 found to have small LV apical thrombus on ECHO. Pharmacy consulted to dose IV heparin, which was switched to bivalirudin 2/15 for HIT. 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 SRA very positive at 8. Heparin allergy has appropriately been added to chart and updated with SRA results.   APTT remains therapeutic at 64 seconds at 0.07 mg/kg/hr. No active bleed issues reported. Hemoglobin is low but stable at 9.2; nephrology has scheduled Aranesp every Monday. Platelet count WNL. LFTs improved to WNL. Noted DDI with warfarin and amiodarone (will likely need lower warfarin doses).  Noted, bivalirudin will falsely elevate INR. INR has trended up today 2.0>>2.7 after 4 doses of warfarin.  Per protocol, will stop bivalirudin when INR > 3.0 and check INR and aPTT 4-6 hr later.  1. If INR at goal (2-3)         a. If aPTT > 40: Assume prolonged bivalirudin clearance and  repeat aPTT and INR in 4-6 hrs.         b. If aPTT < 40: Begin warfarin alone 2. INR subtherapeutic: Resume bivalirudin  Goal of Therapy:  APTT 50-65 sec  Monitor platelets by anticoagulation protocol: Yes   Plan:  - Continue bivalirudin infusion at 0.07 mg/kg/hr until INR >3.0, then follow bivalirudin transition to warfarin protocol listed above - Warfarin 3mg  PO x 1 dose tonight - Monitor daily aPTT, CBC, INR, DDI with amiodarone, s/sx of bleeding   Arturo Morton, PharmD, BCPS Please check AMION for all [place] contact numbers Clinical Pharmacist 05/28/2019 9:03 AM

## 2019-05-28 NOTE — Progress Notes (Signed)
[facility] Progress Note    Assessment/ Plan:   1. AKIpresumably due to ATN with ischemic/nephrotoxic insults worsened by cardiogenic shock. Started on CRRT 04/23/19-  3/7.   Tolerated IHD on 3/10 and 3/12 - removed 3 liters.  will keep on MWF schedule.  Now that he has tolerated IHD- thi issue is with trach will need LTACH  2. Cardiogenic shock- Hx milrinone due to development of a fib with rvr and wide complex tachycardia. -Known severe NICM with EF <20% andmay not tolerate  IHD but seems to be so far   3. Acute stroke- presumed embolic with bilateral infarcts and LV thrombus- received tpa on admission 4. SVT/polymorphic VT- per Cardiology 5. Acute hypoxic respiratory failure- s/p trach  6. 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 HIT Ab+on Argatroban. 7. HTN/vol-  BP low-  Does not appear to have significant volume on board, CCM tells me he is much above his EDW-  By weights seems to be on the lower side- no pul edema by x-ray 3/11 - on max dose midodrine - UF as able with HD 8. Anemia-   iron stores low, got feraheme on 3/9 and 3/12,  - also  giving ESA - darbe 100 per week 9. Bones-  Phos ok right now-  No binder-   intact PTH 70 10. Disposition- overall prognosis is poor with ongoing RRT , trach, as well as cardiogenic shock/cardiomyopathy.  Whether or not he tolerates IHD was a big step- seems to have passed that step but with trach and MS the way it is-  now the only option for HD out of hospital is LTACH  Subjective:   [place] yest-  Removed 2500 tolerated well.  Labs reflective of HD   Objective:   BP 112/76   Pulse (!) 108   Temp 98.5 F (36.9 C) (Oral)   Resp (!) 26   Ht 6' 1"  (1.854 m)   Wt 104.8 kg   SpO2 93%   BMI 30.48 kg/m   Intake/Output Summary (Last 24 hours) at 05/28/2019 0955 Last data filed at 05/28/2019 0800 Gross per 24 hour  Intake 1187.34 ml  Output 3322 ml  Net -2134.66 ml   Weight change: -1.2 kg  Physical  Exam: Gen: awake in bed, trach collar, nonverbal. Follows commands but distracted  HEENT - trach in place CVS: S1S2 no rub Resp:reduced breath sounds Abd: soft/ND Ext: he has no edema Access: rightIJtunneled catheter  Imaging: No results found.  Labs: BMET Recent Labs  Lab 05/25/19 0133 05/25/19 1626 05/26/19 0337 05/26/19 1535 05/27/19 0543 05/27/19 1653 05/28/19 0349  NA 145 140 141 145 145 141 141  K 3.6 4.2 4.5 4.4 4.2 4.4 4.3  CL 95* 98 99 102 100 97* 98  CO2 34* 27 25 26 24  19* 23  GLUCOSE 196* 229* 203* 216* 267* 228* 303*  BUN 80* 44* 63* 79* 98* 47* 75*  CREATININE 5.90* 3.93* 4.97* 5.90* 6.39* 4.06* 5.43*  CALCIUM 9.2 8.9 9.2 9.3 8.9 9.4 8.5*  PHOS 3.7 2.8 3.5 4.1 3.8  3.9 4.8* 4.5   CBC Recent Labs  Lab 05/25/19 0133 05/26/19 0337 05/27/19 0543 05/28/19 0349  WBC 8.1 11.0* 14.8* 16.6*  HGB 7.3* 7.9* 8.1* 9.2*  HCT 23.6* 25.9* 26.8* 29.6*  MCV 99.6 101.2* 101.9* 100.7*  PLT 315 381 392 332    Medications:    . amiodarone  200 mg Per Tube BID  . ARIPiprazole  5 mg Per  Tube Daily  . aspirin  81 mg Per Tube Daily  . atorvastatin  40 mg Per Tube q1800  . chlorhexidine gluconate (MEDLINE KIT)  15 mL Mouth Rinse BID  . Chlorhexidine Gluconate Cloth  6 each Topical Q0600  . Chlorhexidine Gluconate Cloth  6 each Topical Q0600  . darbepoetin (ARANESP) injection - NON-DIALYSIS  100 mcg Subcutaneous Q Mon-1800  . feeding supplement (PRO-STAT SUGAR FREE 64)  60 mL Per Tube BID  . FLUoxetine  20 mg Per Tube Daily  . insulin aspart  0-20 Units Subcutaneous Q4H  . insulin aspart  5 Units Subcutaneous Q4H  . insulin detemir  27 Units Subcutaneous BID  . mouth rinse  15 mL Mouth Rinse 10 times per day  . midodrine  15 mg Per Tube TID WC  . mupirocin ointment  1 application Nasal BID  . pantoprazole sodium  40 mg Per Tube Q1200  . sodium chloride flush  10-40 mL Intracatheter Q12H  . warfarin  3 mg Per Tube ONCE-1800  . Warfarin - Pharmacist Dosing  Inpatient   Does not apply [place]  05/28/2019, 9:55 AM

## 2019-05-28 NOTE — Progress Notes (Signed)
NAME:  Carlos Brewer., MRN:  [id number], DOB:  21-Nov-1985, LOS: 77 ADMISSION DATE:  05/11/2019, CONSULTATION DATE:  04/29/2019 REFERRING MD:  Dr. Leonel Ramsay, CHIEF COMPLAINT:  Slurred speech  Brief History   34 yo male smoker found to have slurred speech and Rt sided weakness.  Admitted with left MCA CVA and multiple smaller embolic infarcts s/p tPA and thrombectomy in IR.  Additionally found to have left ventricular  thrombus.    Course complicated by septic shock r/t HCAP, AKI requiring CRRT, polymorphic VT and cardiogenic shock.  Intermittent pressor dependence, required tracheostomy  Past Medical History  Systolic CHF with non ischemic CM, Cocaine abuse, OSA, DM Hx of CHF (EF 15%)  Hx myocarditis Smoker 1/2 ppd   Significant Hospital Events   2/05 Admit, tPA, IR thrombectomy 2/6 100% on fvent at 2300  Overnight requiring increasing levophed and phenylephrine.    2/6: switched to levophed, epi, started antibiotics, started on CRRT.  2/9  developed wide-complex tachycardia and hypotension, started on amiodarone drip, increase Levophed drip 2/15 drop in platelets, heparin stopped, bival started 2/17 Hypotensive and back on Levophed 2/18 trach, lines changed 2/19 started milrinone , fever 103 2/20 Episode of wide-complex tachycardia yesterday, changed from Levophed to vasopressin 2/22 stopping vanc. Still on inotrope support. Some AF w/ RVR. Had to be placed back on pressors. ivabradine added 2/23 still on pressors/ CRRT continued 2/24 tmax 98.4, Remains on CRRT, even UF, remains anuric, Levophed at 14 mcg/min, Milrinone at 0.125 mcg/kg/min, Coox 63.4 Doing well this morning on ATC, no events overnight.  Midodrine added   2/25 more interactive, remains on ATC, tmax 99.5/ WBC 21.4, ~900 ml liquid stool/ 24 hours, neg Cdiff, Levophed up to 20 mcg/min, CVP 2, ,milrinone remains at 0.125 mcg/kg/min, coox 92.7, Off CRRT since last night ~2000 s/p clotted x 3 off citrate, restarted    3/1 Amio drip restarted for WCT/atrial flutter, milrinone turned off 3/2 CRRT stopped, permacath placed 3/5 crrt resumed 3/6 started on pressors for crrt tolerance 3/8 No longer on CRRT or pressor support  3/12 Tolerated PS for >2 hours   Consults:  Neuro IR Cardiology  Nephrology Heart Failure  EP   Procedures:  2/5-2/6:  TPA given at 428 am (total of 90 Mg) 520 am went to IR  S/P Lt common carotid arteriogram followed by complete revascularization of occluded LT MCA sup division mid M2 seg with x 1 pass with 57mx 40 mm solitaire X ret river device and penumbra aspiration with TICI 3 revascularization  ETT 2/05 >> 2/18 LIJ CVL 2/5 >>2/18  RT [place] CVL 2/18 >>3/2 RIJ HD cath 2/6 >>2/18 6 shiley cuffed trach 2/18  >>3/4  RT fem HD 2/19 >>3/2 RIJ permacath 3/2>> 3/4 shiley 4 uncuffed.... dislodged placed back 6cuffed shiley that evening 3/5: change to #6 cuffed distal XLT  Significant Diagnostic Tests:  CT angio head/neck 2/05 >> occlusion of Lt MCA bifurcation Echo 2/05 >> EF less than 20%, cannot rule out apical thrombus MRI brain 2/11 > extensive acute infarction of multiple areas without large or medium vessel occlusion Echocardiogram 2/8 Left ventricular ejection fraction, by estimation, is <20%. The left  ventricle has severely decreased function. The left ventricle demonstrates  global hypokinesis. The left ventricular internal cavity size was severely  dilated. Possible small 0.8 x  0.6 cm apical thrombus. Somewhat subtle finding, could confirm with  cardiac MRI. However, if patient has had CVA, would be reasonable to  anticoagulate given severity of  LV dysfunction if no contraindications to  anticoagulation.   Micro Data:  SARS CoV2 PCR 2/05 >> negative Influenza PCR 2/05 >> negative Urine 2/6 >> ng resp 2/6 >> nml flora BC 2/6 >>ng resp 2/16 >> ng [facility] 2/19 >> negative  BC x 2 2/25 >>ng Cdiff 2/25 >> neg Trach asp 3/11>> BC 3/11>> 1 of 4 with GPC BC 3/12  >>  Antimicrobials:  mero 2/6 -2/12 Zosyn 2/6 , 2/19 >> 2/23 vanc 2/6 >> 2/9 , 2/19 >>2/22 Ceftriaxone 3/12> Interim history/subjective:  Tmax 102 yesterday, afebrile this morning. Started on antibiotics. Repeated blood cultures for positive GPC in blood Did tolerate ~2-3 hours of PS yesterday. Remains agitated.  Objective   Blood pressure 112/76, pulse (!) 108, temperature 98.5 F (36.9 C), temperature source Oral, resp. rate (!) 26, height 6' 1"  (1.854 m), weight 104.8 kg, SpO2 93 %.    Vent Mode: PSV FiO2 (%):  [40 %] 40 % Set Rate:  [20 bmp] 20 bmp Vt Set:  [640 mL] 640 mL PEEP:  [5 cmH20] 5 cmH20 Pressure Support:  [15 cmH20] 15 cmH20 Plateau Pressure:  [18 cmH20-26 cmH20] 23 cmH20   Intake/Output Summary (Last 24 hours) at 05/28/2019 0910 Last data filed at 05/28/2019 0800 Gross per 24 hour  Intake 1187.34 ml  Output 3322 ml  Net -2134.66 ml   Filed Weights   05/27/19 0500 05/27/19 0730 05/27/19 1137  Weight: 108.5 kg 107.3 kg 104.8 kg   Physical Exam: General: Chronically ill-appearing, sleeping HENT: [place], AT, OP clear, MMM Neck: Cuffed trach #6 XLT in place, c/d/i Eyes: EOMI, no scleral icterus Respiratory: Clear to auscultation bilaterally.  No crackles, wheezing or rales Cardiovascular: RRR, -M/R/G, no JVD GI: BS+, soft, nontender Extremities:-Edema,-tenderness Neuro: Awake, does not follow commands, moves extremities x4  Resolved Hospital Problem list   HCAP 5/17, Acute metabolic encephalopathy 2nd to hypoxia and renal failure, Cardiogenic shock  Assessment & Plan:   Hyperactive delirium  Started on aripiprazole yesterday (against seroquel due to drug interactions)  Acute hypoxic, hypercapnic respiratory failure with compromised airway in setting of CVA. Failure to wean from vent s/p tracheostomy.  On full vent support PSV as tolerated however agitation is limiting his progress Now with a #6 XLT trach, following with speech therapy Continue routine  trach care PRN suctioning  Follow intermittent chest x-ray Volume removal per diaysis  Acute on chronic systolic CHF, non-ischemic.  Course complicated by cardiogenic shock now improved Polymorphic VT. Apical thrombus in left ventricle. HLD. Dialysis per Nephrology Continue amiodarone, aspirin, Lipitor, ivabradine Transition from bivalirudin to warfarin  Fever, leukocytosis secondary suspected aspiration/CAP CXR with LLL infiltrate F/u trach aspirate and blood cultures PRN tylenol Continue ceftriaxone x 5 days  Lt MCA CVA likely embolic. Hx of cocaine abuse, depression. Prozac as ordered PT/OT  AKI from ATN in setting of hypoxia, cardiogenic shock. Off CVVH since 3/7. Tolerating iHD on 3/10.   Appreciate nephrology assistance.  Follow urine output, BMP Will need to involve case management regarding trach/HD placement options  HIT. Anemia of critical illness. - heparin induced Plt Ab 1.692 from 05/02/19 - SRA positive, 90% from 05/03/19 Transition from bivalirudin to warfarin. Will need to wait for INR >3 per protocol Follow CBC Conservative transfusion goal hemoglobin 7  DM type II poorly controlled with hyperglycemia. Sign scale insulin per protocol Levemir  Insomnia. Ambien as ordered  Best practice:  Diet: NPO,  TFs DVT prophylaxis: SCDs/ bivalirudin GI prophylaxis: protonix Mobility: progress when able Code Status:  full code Disposition: ICU Family: Updated mother 3/12. On 3/13, left brief VM on plan.   Labs:   CMP Latest Ref Rng & Units 05/28/2019 05/27/2019 05/27/2019  Glucose 70 - 99 mg/dL 303(H) 228(H) 267(H)  BUN 6 - 20 mg/dL 75(H) 47(H) 98(H)  Creatinine 0.61 - 1.24 mg/dL 5.43(H) 4.06(H) 6.39(H)  Sodium 135 - 145 mmol/L 141 141 145  Potassium 3.5 - 5.1 mmol/L 4.3 4.4 4.2  Chloride 98 - 111 mmol/L 98 97(L) 100  CO2 22 - 32 mmol/L 23 19(L) 24  Calcium 8.9 - 10.3 mg/dL 8.5(L) 9.4 8.9  Total Protein 6.5 - 8.1 g/dL - - 7.5  Total Bilirubin 0.3 - 1.2  mg/dL - - 1.0  Alkaline Phos 38 - 126 U/L - - 210(H)  AST 15 - 41 U/L - - 37  ALT 0 - 44 U/L - - 19    CBC Latest Ref Rng & Units 05/28/2019 05/27/2019 05/26/2019  WBC 4.0 - 10.5 K/uL 16.6(H) 14.8(H) 11.0(H)  Hemoglobin 13.0 - 17.0 g/dL 9.2(L) 8.1(L) 7.9(L)  Hematocrit 39.0 - 52.0 % 29.6(L) 26.8(L) 25.9(L)  Platelets 150 - 400 K/uL 332 392 381    ABG    Component Value Date/Time   PHART 7.335 (L) 05/21/2019 0427   PCO2ART 59.2 (H) 05/21/2019 0427   PO2ART 165.0 (H) 05/21/2019 0427   HCO3 31.5 (H) 05/21/2019 0427   TCO2 43 (H) 05/22/2019 2019   ACIDBASEDEF 3.0 (H) 05/04/2019 2218   O2SAT 90.3 05/26/2019 1042    CBG (last 3)  Recent Labs    05/27/19 2317 05/28/19 0327 05/28/19 0806  GLUCAP 318* 281* 216*   The patient is critically ill with multiple organ systems failure and requires high complexity decision making for assessment and support, frequent evaluation and titration of therapies, application of advanced monitoring technologies and extensive interpretation of multiple databases.   Critical Care Time devoted to patient care services described in this note is 32 Minutes.   Rodman Pickle, M.D. [facility] Pulmonary/Critical Care Medicine 05/28/2019 9:10 AM   Please see Amion for pager number to reach on-call Pulmonary and Critical Care Team.

## 2019-05-29 DIAGNOSIS — Z9911 Dependence on respirator [ventilator] status: Secondary | ICD-10-CM | POA: Diagnosis not present

## 2019-05-29 DIAGNOSIS — Z930 Tracheostomy status: Secondary | ICD-10-CM | POA: Diagnosis not present

## 2019-05-29 DIAGNOSIS — N170 Acute kidney failure with tubular necrosis: Secondary | ICD-10-CM | POA: Diagnosis not present

## 2019-05-29 DIAGNOSIS — I639 Cerebral infarction, unspecified: Secondary | ICD-10-CM | POA: Diagnosis not present

## 2019-05-29 LAB — GLUCOSE, CAPILLARY
Glucose-Capillary: 246 mg/dL — ABNORMAL HIGH (ref 70–99)
Glucose-Capillary: 252 mg/dL — ABNORMAL HIGH (ref 70–99)
Glucose-Capillary: 269 mg/dL — ABNORMAL HIGH (ref 70–99)
Glucose-Capillary: 231 mg/dL — ABNORMAL HIGH (ref 70–99)
Glucose-Capillary: 164 mg/dL — ABNORMAL HIGH (ref 70–99)
Glucose-Capillary: 175 mg/dL — ABNORMAL HIGH (ref 70–99)

## 2019-05-29 LAB — CBC
HCT: 29.2 % — ABNORMAL LOW (ref 39.0–52.0)
Hemoglobin: 8.9 g/dL — ABNORMAL LOW (ref 13.0–17.0)
MCH: 30.6 pg (ref 26.0–34.0)
MCHC: 30.5 g/dL (ref 30.0–36.0)
MCV: 100.3 fL — ABNORMAL HIGH (ref 80.0–100.0)
Platelets: 285 10*3/uL (ref 150–400)
RBC: 2.91 MIL/uL — ABNORMAL LOW (ref 4.22–5.81)
RDW: 15.9 % — ABNORMAL HIGH (ref 11.5–15.5)
WBC: 16 10*3/uL — ABNORMAL HIGH (ref 4.0–10.5)
nRBC: 1.1 % — ABNORMAL HIGH (ref 0.0–0.2)

## 2019-05-29 LAB — MAGNESIUM: Magnesium: 2.5 mg/dL — ABNORMAL HIGH (ref 1.7–2.4)

## 2019-05-29 LAB — PROTIME-INR
INR: 3.1 — ABNORMAL HIGH (ref 0.8–1.2)
INR: 2.5 — ABNORMAL HIGH (ref 0.8–1.2)
Prothrombin Time: 32.3 seconds — ABNORMAL HIGH (ref 11.4–15.2)
Prothrombin Time: 27.1 seconds — ABNORMAL HIGH (ref 11.4–15.2)

## 2019-05-29 LAB — CULTURE, RESPIRATORY W GRAM STAIN: Special Requests: NORMAL

## 2019-05-29 LAB — RENAL FUNCTION PANEL
Albumin: 2.7 g/dL — ABNORMAL LOW (ref 3.5–5.0)
Anion gap: 20 — ABNORMAL HIGH (ref 5–15)
BUN: 115 mg/dL — ABNORMAL HIGH (ref 6–20)
CO2: 25 mmol/L (ref 22–32)
Calcium: 8.7 mg/dL — ABNORMAL LOW (ref 8.9–10.3)
Chloride: 100 mmol/L (ref 98–111)
Creatinine, Ser: 6.39 mg/dL — ABNORMAL HIGH (ref 0.61–1.24)
GFR calc Af Amer: 12 mL/min — ABNORMAL LOW (ref >60)
GFR calc non Af Amer: 10 mL/min — ABNORMAL LOW (ref >60)
Glucose, Bld: 238 mg/dL — ABNORMAL HIGH (ref 70–99)
Phosphorus: 6.7 mg/dL — ABNORMAL HIGH (ref 2.5–4.6)
Potassium: 4 mmol/L (ref 3.5–5.1)
Sodium: 145 mmol/L (ref 135–145)

## 2019-05-29 LAB — APTT
aPTT: 67 seconds — ABNORMAL HIGH (ref 24–36)
aPTT: 40 seconds — ABNORMAL HIGH (ref 24–36)

## 2019-05-29 LAB — PHOSPHORUS: Phosphorus: 6.5 mg/dL — ABNORMAL HIGH (ref 2.5–4.6)

## 2019-05-29 MED ORDER — CHLORHEXIDINE GLUCONATE CLOTH 2 % EX PADS
6.0000 | MEDICATED_PAD | Freq: Every day | CUTANEOUS | Status: DC
  Administered 2019-06-01 – 2019-06-02 (×3): 6 via TOPICAL

## 2019-05-29 MED ORDER — INSULIN DETEMIR 100 UNIT/ML ~~LOC~~ SOLN
40.0000 [IU] | Freq: Two times a day (BID) | SUBCUTANEOUS | Status: DC
  Administered 2019-05-29 – 2019-06-27 (×51): 40 [IU] via SUBCUTANEOUS
  Filled 2019-05-29 (×66): qty 0.4

## 2019-05-29 MED ORDER — WARFARIN SODIUM 4 MG PO TABS
4.0000 mg | ORAL_TABLET | Freq: Once | ORAL | Status: AC
  Administered 2019-05-29: 4 mg
  Filled 2019-05-29: qty 1

## 2019-05-29 NOTE — Progress Notes (Signed)
[facility] Progress Note    Assessment/ Plan:   1. AKIpresumably due to ATN with ischemic/nephrotoxic insults worsened by cardiogenic shock. Started on CRRT 04/23/19-  3/7.   Tolerated IHD on 3/10 and 3/12 - removed 3 liters.  will keep on MWF schedule- next due tomorrow 3/15.  Now that he has tolerated IHD- the issue is with trach will need LTACH.  Have not done perm access due to poor prognosis-  Pos hit ab-  If staying here much longer may want to consider ?   2. Cardiogenic shock- Hx milrinone due to development of a fib with rvr and wide complex tachycardia. -Known severe NICM with EF <20% andmay not tolerate  IHD but seems to be so far   3. Acute stroke- presumed embolic with bilateral infarcts and LV thrombus- received tpa on admission 4. SVT/polymorphic VT- per Cardiology 5. Acute hypoxic respiratory failure- s/p trach  6. 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 HIT Ab+on Argatroban. 7. HTN/vol-  BP low-  Does not appear to have significant volume on board,   By weights seems to be on the lower side- no pul edema by x-ray 3/11 - on max dose midodrine - UF as able with HD 8. Anemia-   iron stores low, got feraheme on 3/9 and 3/12,  - also  giving ESA - darbe 100 per week 9. Bones-  Phos had been ok -  No binder- rising now, will watch-  For HD tomorrow should help-    intact PTH 70 10. Disposition- overall prognosis is poor with ongoing RRT , trach, as well as cardiogenic shock/cardiomyopathy.  Whether or not he tolerates IHD was a big step- seems to have passed that step but with trach and MS the way it is-  now the only option for HD out of hospital is LTACH.  Hope that his youth can be on his side   Subjective:   910 UOP - but BUN and crt cont to climb off of HD   Objective:   BP 98/73   Pulse 91   Temp 98.7 F (37.1 C) (Axillary)   Resp 14   Ht _0  (1.854 m)   Wt 104.8 kg   SpO2 95%   BMI 30.48 kg/m   Intake/Output Summary (Last 24 hours)  at 05/29/2019 0950 Last data filed at 05/29/2019 0600 Gross per 24 hour  Intake 1492.74 ml  Output 1150 ml  Net 342.74 ml   Weight change:   Physical Exam: Gen: awake in bed, trach collar, nonverbal. Follows commands but distracted  HEENT - trach in place CVS: S1S2 no rub Resp:reduced breath sounds Abd: soft/ND Ext: he has no edema Access: rightIJtunneled catheter  Imaging: No results found.  Labs: BMET Recent Labs  Lab 05/25/19 1626 05/26/19 0337 05/26/19 1535 05/27/19 0543 05/27/19 1653 05/28/19 0349 05/29/19 0518  NA 140 141 145 145 141 141 145  K 4.2 4.5 4.4 4.2 4.4 4.3 4.0  CL 98 99 102 100 97* 98 100  CO2 _1 19* 23 25  GLUCOSE 229* 203* 216* 267* 228* 303* 238*  BUN 44* 63* 79* 98* 47* 75* 115*  CREATININE 3.93* 4.97* 5.90* 6.39* 4.06* 5.43* 6.39*  CALCIUM 8.9 9.2 9.3 8.9 9.4 8.5* 8.7*  PHOS 2.8 3.5 4.1 3.8  3.9 4.8* 4.5 6.5*  6.7*   CBC Recent Labs  Lab 05/26/19 0337 05/27/19 0543 05/28/19 0349 05/29/19 0518  WBC 11.0* 14.8* 16.6* 16.0*  HGB 7.9* 8.1*  9.2* 8.9*  HCT 25.9* 26.8* 29.6* 29.2*  MCV 101.2* 101.9* 100.7* 100.3*  PLT 381 392 332 285    Medications:    . amiodarone  200 mg Per Tube BID  . ARIPiprazole  5 mg Per Tube Daily  . aspirin  81 mg Per Tube Daily  . atorvastatin  40 mg Per Tube q1800  . chlorhexidine gluconate (MEDLINE KIT)  15 mL Mouth Rinse BID  . Chlorhexidine Gluconate Cloth  6 each Topical Q0600  . Chlorhexidine Gluconate Cloth  6 each Topical Q0600  . darbepoetin (ARANESP) injection - NON-DIALYSIS  100 mcg Subcutaneous Q Mon-1800  . feeding supplement (PRO-STAT SUGAR FREE 64)  60 mL Per Tube BID  . FLUoxetine  20 mg Per Tube Daily  . insulin aspart  0-20 Units Subcutaneous Q4H  . insulin aspart  5 Units Subcutaneous Q4H  . insulin detemir  35 Units Subcutaneous BID  . mouth rinse  15 mL Mouth Rinse 10 times per day  . midodrine  15 mg Per Tube TID WC  . mupirocin ointment  1 application Nasal BID  .  pantoprazole sodium  40 mg Per Tube Q1200  . sodium chloride flush  10-40 mL Intracatheter Q12H  . Warfarin - Pharmacist Dosing Inpatient   Does not apply [place]  05/29/2019, 9:50 AM

## 2019-05-29 NOTE — Progress Notes (Signed)
NAME:  Carlos Maland., MRN:  [id number], DOB:  07/27/1985, LOS: 36 ADMISSION DATE:  05/01/2019, CONSULTATION DATE:  04/19/2019 REFERRING MD:  Dr. Leonel Ramsay, CHIEF COMPLAINT:  Slurred speech  Brief History   34 yo male smoker found to have slurred speech and Rt sided weakness.  Admitted with left MCA CVA and multiple smaller embolic infarcts s/p tPA and thrombectomy in IR.  Additionally found to have left ventricular  thrombus.    Course complicated by septic shock r/t HCAP, AKI requiring CRRT, polymorphic VT and cardiogenic shock.  Intermittent pressor dependence, required tracheostomy  Past Medical History  Systolic CHF with non ischemic CM, Cocaine abuse, OSA, DM Hx of CHF (EF 15%)  Hx myocarditis Smoker 1/2 ppd   Significant Hospital Events   2/05 Admit, tPA, IR thrombectomy 2/6 100% on fvent at 2300  Overnight requiring increasing levophed and phenylephrine.    2/6: switched to levophed, epi, started antibiotics, started on CRRT.  2/9  developed wide-complex tachycardia and hypotension, started on amiodarone drip, increase Levophed drip 2/15 drop in platelets, heparin stopped, bival started 2/17 Hypotensive and back on Levophed 2/18 trach, lines changed 2/19 started milrinone , fever 103 2/20 Episode of wide-complex tachycardia yesterday, changed from Levophed to vasopressin 2/22 stopping vanc. Still on inotrope support. Some AF w/ RVR. Had to be placed back on pressors. ivabradine added 2/23 still on pressors/ CRRT continued 2/24 tmax 98.4, Remains on CRRT, even UF, remains anuric, Levophed at 14 mcg/min, Milrinone at 0.125 mcg/kg/min, Coox 63.4 Doing well this morning on ATC, no events overnight.  Midodrine added   2/25 more interactive, remains on ATC, tmax 99.5/ WBC 21.4, ~900 ml liquid stool/ 24 hours, neg Cdiff, Levophed up to 20 mcg/min, CVP 2, ,milrinone remains at 0.125 mcg/kg/min, coox 92.7, Off CRRT since last night ~2000 s/p clotted x 3 off citrate, restarted    3/1 Amio drip restarted for WCT/atrial flutter, milrinone turned off 3/2 CRRT stopped, permacath placed 3/5 crrt resumed 3/6 started on pressors for crrt tolerance 3/8 No longer on CRRT or pressor support  3/12 Tolerated PS for >2 hours   Consults:  Neuro IR Cardiology  Nephrology Heart Failure  EP   Procedures:  2/5-2/6:  TPA given at 428 am (total of 90 Mg) 520 am went to IR  S/P Lt common carotid arteriogram followed by complete revascularization of occluded LT MCA sup division mid M2 seg with x 1 pass with 18mx 40 mm solitaire X ret river device and penumbra aspiration with TICI 3 revascularization  ETT 2/05 >> 2/18 LIJ CVL 2/5 >>2/18  RT [place] CVL 2/18 >>3/2 RIJ HD cath 2/6 >>2/18 6 shiley cuffed trach 2/18  >>3/4  RT fem HD 2/19 >>3/2 RIJ permacath 3/2>> 3/4 shiley 4 uncuffed.... dislodged placed back 6cuffed shiley that evening 3/5: change to #6 cuffed distal XLT  Significant Diagnostic Tests:  CT angio head/neck 2/05 >> occlusion of Lt MCA bifurcation Echo 2/05 >> EF less than 20%, cannot rule out apical thrombus MRI brain 2/11 > extensive acute infarction of multiple areas without large or medium vessel occlusion Echocardiogram 2/8 Left ventricular ejection fraction, by estimation, is <20%. The left  ventricle has severely decreased function. The left ventricle demonstrates  global hypokinesis. The left ventricular internal cavity size was severely  dilated. Possible small 0.8 x  0.6 cm apical thrombus. Somewhat subtle finding, could confirm with  cardiac MRI. However, if patient has had CVA, would be reasonable to  anticoagulate given severity of  LV dysfunction if no contraindications to  anticoagulation.   Micro Data:  SARS CoV2 PCR 2/05 >> negative Influenza PCR 2/05 >> negative Urine 2/6 >> ng resp 2/6 >> nml flora BC 2/6 >>ng resp 2/16 >> ng [facility] 2/19 >> negative  BC x 2 2/25 >>ng Cdiff 2/25 >> neg Trach asp 3/11>> BC 3/11>> 1 of 4 with CoNS BC  3/12 >>  Antimicrobials:  mero 2/6 -2/12 Zosyn 2/6 , 2/19 >> 2/23 vanc 2/6 >> 2/9 , 2/19 >>2/22 Ceftriaxone 3/12> Interim history/subjective:  Remains febrile with Tmax 103.7 Tolerated PS 2-3 hours yesterday Appears less agitated this morning Objective   Blood pressure 98/73, pulse 91, temperature 98.7 F (37.1 C), temperature source Axillary, resp. rate 14, height _0  (1.854 m), weight 104.8 kg, SpO2 95 %.    Vent Mode: CPAP;PSV FiO2 (%):  [40 %] 40 % Set Rate:  [20 bmp] 20 bmp PEEP:  [5 cmH20] 5 cmH20 Pressure Support:  [15 cmH20] 15 cmH20 Plateau Pressure:  [22 cmH20-28 cmH20] 28 cmH20   Intake/Output Summary (Last 24 hours) at 05/29/2019 1030 Last data filed at 05/29/2019 0600 Gross per 24 hour  Intake 1305.58 ml  Output 1105 ml  Net 200.58 ml   Filed Weights   05/27/19 0500 05/27/19 0730 05/27/19 1137  Weight: 108.5 kg 107.3 kg 104.8 kg   Physical Exam: General: Chronically ill-appearing, no acute distress HENT: , AT, OP clear, MMM Neck: Cuffed trach #6 XLT in place, c/d/i Eyes: EOMI, no scleral icterus Respiratory: Clear to auscultation bilaterally.  No crackles, wheezing or rales Cardiovascular: RRR, -M/R/G, no JVD GI: BS+, soft, nontender Extremities:-Edema,-tenderness Neuro: Awake, does not follow commands, moves extremities x 4  Resolved Hospital Problem list   HCAP 3/71, Acute metabolic encephalopathy 2nd to hypoxia and renal failure, Cardiogenic shock  Assessment & Plan:   Hyperactive delirium  Continue aripiprazole (against seroquel due to drug interactions). Can increase if needed on 3/19 due to slow onset  Acute hypoxic, hypercapnic respiratory failure with compromised airway in setting of CVA. Failure to wean from vent s/p tracheostomy.  Tolerating PS 15/5 x 2 days. Continue PS as tolerated On full vent support Now with a #6 XLT trach, following with speech therapy Continue routine trach care PRN suctioning  Follow intermittent chest  x-ray Volume removal per diaysis  Chronic systolic CHF, non-ischemic Polymorphic VT. Apical thrombus in left ventricle. HLD. Dialysis per Nephrology Continue amiodarone, aspirin, Lipitor, ivabradine Transition from bivalirudin to warfarin. Will recheck APTT and INR this PM. Pharmacy will manage anticoagulation transition if appropriate  Fever, leukocytosis secondary suspected aspiration/CAP CoN Staph in 1 of 4 Blood cultures, likely contaminant CXR with LLL infiltrate F/u trach aspirate and blood cultures PRN tylenol Continue ceftriaxone x 5 days  Lt MCA CVA likely embolic. Hx of cocaine abuse, depression. Prozac as ordered PT/OT  AKI from ATN in setting of hypoxia, cardiogenic shock. Off CVVH since 3/7. Tolerating iHD on 3/10.   Appreciate nephrology assistance.  Follow urine output, BMP Will need to involve case management regarding trach/HD placement options  HIT. Anemia of critical illness. - heparin induced Plt Ab 1.692 from 05/02/19 - SRA positive, 90% from 05/03/19 Transition from bivalirudin to warfarin. Will need to wait for INR >3 per protocol Follow CBC Conservative transfusion goal hemoglobin 7  DM type II poorly controlled with hyperglycemia. Sign scale insulin per protocol Levemir  Insomnia. Ambien as ordered  Best practice:  Diet: NPO,  TFs DVT prophylaxis: SCDs/ bivalirudin GI prophylaxis: protonix Mobility:  progress when able Code Status: full code Disposition: ICU Family: Lasted updated mother 3/12. On 3/14, left brief VM on plan.   Labs:   CMP Latest Ref Rng & Units 05/29/2019 05/28/2019 05/27/2019  Glucose 70 - 99 mg/dL 238(H) 303(H) 228(H)  BUN 6 - 20 mg/dL 115(H) 75(H) 47(H)  Creatinine 0.61 - 1.24 mg/dL 6.39(H) 5.43(H) 4.06(H)  Sodium 135 - 145 mmol/L 145 141 141  Potassium 3.5 - 5.1 mmol/L 4.0 4.3 4.4  Chloride 98 - 111 mmol/L 100 98 97(L)  CO2 22 - 32 mmol/L 25 23 19(L)  Calcium 8.9 - 10.3 mg/dL 8.7(L) 8.5(L) 9.4  Total Protein 6.5  - 8.1 g/dL - - -  Total Bilirubin 0.3 - 1.2 mg/dL - - -  Alkaline Phos 38 - 126 U/L - - -  AST 15 - 41 U/L - - -  ALT 0 - 44 U/L - - -    CBC Latest Ref Rng & Units 05/29/2019 05/28/2019 05/27/2019  WBC 4.0 - 10.5 K/uL 16.0(H) 16.6(H) 14.8(H)  Hemoglobin 13.0 - 17.0 g/dL 8.9(L) 9.2(L) 8.1(L)  Hematocrit 39.0 - 52.0 % 29.2(L) 29.6(L) 26.8(L)  Platelets 150 - 400 K/uL 285 332 392    ABG    Component Value Date/Time   PHART 7.335 (L) 05/21/2019 0427   PCO2ART 59.2 (H) 05/21/2019 0427   PO2ART 165.0 (H) 05/21/2019 0427   HCO3 31.5 (H) 05/21/2019 0427   TCO2 43 (H) 05/22/2019 2019   ACIDBASEDEF 3.0 (H) 05/04/2019 2218   O2SAT 90.3 05/26/2019 1042    CBG (last 3)  Recent Labs    05/28/19 2348 05/29/19 0415 05/29/19 0804  GLUCAP 285* 246* 252*   The patient is critically ill with multiple organ systems failure and requires high complexity decision making for assessment and support, frequent evaluation and titration of therapies, application of advanced monitoring technologies and extensive interpretation of multiple databases.   Critical Care Time devoted to patient care services described in this note is 35 Minutes.   Rodman Pickle, M.D. [facility] Pulmonary/Critical Care Medicine 05/29/2019 10:30 AM   Please see Amion for pager number to reach on-call Pulmonary and Critical Care Team.

## 2019-05-29 NOTE — Progress Notes (Signed)
ANTICOAGULATION CONSULT NOTE  Pharmacy Consult:  Bivalirudin + add Coumadin Indication: stroke 2/5, LV thrombus 2/8, HIT 2/15   Allergies  Allergen Reactions  . Heparin Other (See Comments)    Heparin induced thrombocytopenia. 2/15 HIT OD 1.692. 2/16 SRA positive-90.     Patient Measurements: Height: 6\' 1"  (185.4 cm) Weight: 231 lb 0.7 oz (104.8 kg) IBW/kg (Calculated) : 79.9   Vital Signs: Temp: 98.3 F (36.8 C) (03/14 0400) Temp Source: Oral (03/14 0400) BP: 114/76 (03/14 0600) Pulse Rate: 91 (03/14 0600)  Labs: Recent Labs    05/27/19 0543 05/27/19 0543 05/27/19 0852 05/27/19 1653 05/28/19 0349 05/29/19 0518  HGB 8.1*   < >  --   --  9.2* 8.9*  HCT 26.8*  --   --   --  29.6* 29.2*  PLT 392  --   --   --  332 285  APTT 30   < > 53*  --  64* 67*  LABPROT 22.8*  --   --   --  28.3* 32.3*  INR 2.0*  --   --   --  2.7* 3.1*  CREATININE 6.39*   < >  --  4.06* 5.43* 6.39*   < > = values in this interval not displayed.    Estimated Creatinine Clearance: 20.7 mL/min (A) (by C-G formula based on SCr of 6.39 mg/dL (H)).  Assessment: 34 yr old male with history of ESRD on HD presented with left MCA infarct - received TPA and revascularization with IR on 2/5. On 2/8 found to have small LV apical thrombus on ECHO. Pharmacy consulted to dose IV heparin, which was switched to bivalirudin 2/15 for HIT. 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 SRA very positive at 53. Heparin allergy has appropriately been added to chart and updated with SRA results.   APTT slightly supratherapeutic at 67 seconds on 0.07 mg/kg/hr. No active bleed or infusion issues reported. Hemoglobin is low but stable at 8.9; nephrology has scheduled Aranesp every Monday. Platelet count WNL. LFTs improved to WNL. Noted DDI with warfarin and amiodarone (will likely need lower warfarin doses).  Noted - bivalirudin will falsely elevate INR. INR has trended up today 2.7 >> 3.1 after 5  doses of warfarin. Per protocol, will stop bivalirudin when INR > 3.0 and check INR and aPTT 4-6 hr later.  1. If INR at goal (2-3)         a. If aPTT > 40: Assume prolonged bivalirudin clearance and  repeat aPTT and INR in 4-6 hrs.         b. If aPTT < 40: Begin warfarin alone 2. INR subtherapeutic: Resume bivalirudin  Goal of Therapy:  APTT 50-65 sec  Monitor platelets by anticoagulation protocol: Yes   Plan:  - Stop bivalirudin this AM and check aPTT/INR 6 hours later, then follow bivalirudin transition to warfarin protocol listed above - F/u repeat INR to enter today's warfarin dose - Monitor daily aPTT, CBC, INR, DDI with amiodarone, s/sx of bleeding   Arturo Morton, PharmD, BCPS Please check AMION for all [place] contact numbers Clinical Pharmacist 05/29/2019 7:28 AM

## 2019-05-29 NOTE — Progress Notes (Addendum)
ANTICOAGULATION CONSULT NOTE  Pharmacy Consult:  Bivalirudin + add Coumadin Indication: stroke 2/5, LV thrombus 2/8, HIT 2/15   Allergies  Allergen Reactions  . Heparin Other (See Comments)    Heparin induced thrombocytopenia. 2/15 HIT OD 1.692. 2/16 SRA positive-90.     Patient Measurements: Height: 6\' 1"  (185.4 cm) Weight: 231 lb 0.7 oz (104.8 kg) IBW/kg (Calculated) : 79.9   Vital Signs: Temp: 98.5 F (36.9 C) (03/14 1200) Temp Source: Oral (03/14 1200) BP: 114/81 (03/14 1300) Pulse Rate: 98 (03/14 1300)  Labs: Recent Labs    05/27/19 0543 05/27/19 0543 05/27/19 0852 05/27/19 1653 05/28/19 0349 05/29/19 0518 05/29/19 1351  HGB 8.1*   < >  --   --  9.2* 8.9*  --   HCT 26.8*  --   --   --  29.6* 29.2*  --   PLT 392  --   --   --  332 285  --   APTT 30   < >   < >  --  64* 67* 40*  LABPROT 22.8*   < >  --   --  28.3* 32.3* 27.1*  INR 2.0*   < >  --   --  2.7* 3.1* 2.5*  CREATININE 6.39*  --    < > 4.06* 5.43* 6.39*  --    < > = values in this interval not displayed.    Estimated Creatinine Clearance: 20.7 mL/min (A) (by C-G formula based on SCr of 6.39 mg/dL (H)).  Assessment: 34 yr old male with history of ESRD on HD presented with left MCA infarct - received TPA and revascularization with IR on 2/5. On 2/8 found to have small LV apical thrombus on ECHO. Pharmacy consulted to dose IV heparin, which was switched to bivalirudin 2/15 for HIT. 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 SRA very positive at 70. Heparin allergy has appropriately been added to chart and updated with SRA results.   APTT slightly supratherapeutic at 67 seconds on 0.07 mg/kg/hr. No active bleed or infusion issues reported. Hemoglobin is low but stable at 8.9; nephrology has scheduled Aranesp every Monday. Platelet count WNL. LFTs improved to WNL. Noted DDI with warfarin and amiodarone (will likely need lower warfarin doses).  Noted - bivalirudin will falsely  elevate INR. INR has trended up today 2.7 >> 3.1 after 5 doses of warfarin. Per protocol, will stop bivalirudin when INR > 3.0 and check INR and aPTT 4-6 hr later.  1. If INR at goal (2-3)         a. If aPTT > 40: Assume prolonged bivalirudin clearance and  repeat aPTT and INR in 4-6 hrs.         b. If aPTT < 40: Begin warfarin alone 2. INR subtherapeutic: Resume bivalirudin  Goal of Therapy:  APTT 50-65 sec  Monitor platelets by anticoagulation protocol: Yes   Plan:  - Stop bivalirudin this AM and check aPTT/INR 6 hours later, then follow bivalirudin transition to warfarin protocol listed above - F/u repeat INR to enter today's warfarin dose - Monitor daily aPTT, CBC, INR, DDI with amiodarone, s/sx of bleeding   Arturo Morton, PharmD, BCPS Please check AMION for all [place] contact numbers Clinical Pharmacist 05/29/2019 2:29 PM    ADDENDUM: 6hr aPTT resulted at 40, INR 2.5. Will begin warfarin alone with no further bivalirudin. No active bleed issues reported.  Plan: Will not resume bivalirudin Warfarin 4mg  PO x 1 dose  tonight Monitor daily INR, CBC, s/sx bleeding, DDI with amiodarone   Arturo Morton, PharmD, BCPS Please check AMION for all [place] contact numbers Clinical Pharmacist 05/29/2019 3:04 PM

## 2019-05-30 DIAGNOSIS — N170 Acute kidney failure with tubular necrosis: Secondary | ICD-10-CM | POA: Diagnosis not present

## 2019-05-30 DIAGNOSIS — I639 Cerebral infarction, unspecified: Secondary | ICD-10-CM | POA: Diagnosis not present

## 2019-05-30 DIAGNOSIS — I63312 Cerebral infarction due to thrombosis of left middle cerebral artery: Secondary | ICD-10-CM | POA: Diagnosis not present

## 2019-05-30 DIAGNOSIS — Z930 Tracheostomy status: Secondary | ICD-10-CM | POA: Diagnosis not present

## 2019-05-30 LAB — GLUCOSE, CAPILLARY
Glucose-Capillary: 163 mg/dL — ABNORMAL HIGH (ref 70–99)
Glucose-Capillary: 175 mg/dL — ABNORMAL HIGH (ref 70–99)
Glucose-Capillary: 220 mg/dL — ABNORMAL HIGH (ref 70–99)
Glucose-Capillary: 223 mg/dL — ABNORMAL HIGH (ref 70–99)
Glucose-Capillary: 234 mg/dL — ABNORMAL HIGH (ref 70–99)
Glucose-Capillary: 279 mg/dL — ABNORMAL HIGH (ref 70–99)

## 2019-05-30 LAB — CBC
HCT: 28.5 % — ABNORMAL LOW (ref 39.0–52.0)
Hemoglobin: 8.6 g/dL — ABNORMAL LOW (ref 13.0–17.0)
MCH: 30.9 pg (ref 26.0–34.0)
MCHC: 30.2 g/dL (ref 30.0–36.0)
MCV: 102.5 fL — ABNORMAL HIGH (ref 80.0–100.0)
Platelets: 246 10*3/uL (ref 150–400)
RBC: 2.78 MIL/uL — ABNORMAL LOW (ref 4.22–5.81)
RDW: 15.8 % — ABNORMAL HIGH (ref 11.5–15.5)
WBC: 13.3 10*3/uL — ABNORMAL HIGH (ref 4.0–10.5)
nRBC: 0.6 % — ABNORMAL HIGH (ref 0.0–0.2)

## 2019-05-30 LAB — RENAL FUNCTION PANEL
Albumin: 2.7 g/dL — ABNORMAL LOW (ref 3.5–5.0)
Anion gap: 24 — ABNORMAL HIGH (ref 5–15)
BUN: 132 mg/dL — ABNORMAL HIGH (ref 6–20)
CO2: 24 mmol/L (ref 22–32)
Calcium: 8.9 mg/dL (ref 8.9–10.3)
Chloride: 101 mmol/L (ref 98–111)
Creatinine, Ser: 6.74 mg/dL — ABNORMAL HIGH (ref 0.61–1.24)
GFR calc Af Amer: 11 mL/min — ABNORMAL LOW (ref >60)
GFR calc non Af Amer: 10 mL/min — ABNORMAL LOW (ref >60)
Glucose, Bld: 244 mg/dL — ABNORMAL HIGH (ref 70–99)
Phosphorus: 6.3 mg/dL — ABNORMAL HIGH (ref 2.5–4.6)
Potassium: 3.7 mmol/L (ref 3.5–5.1)
Sodium: 149 mmol/L — ABNORMAL HIGH (ref 135–145)

## 2019-05-30 LAB — PROTIME-INR
INR: 2.6 — ABNORMAL HIGH (ref 0.8–1.2)
Prothrombin Time: 27.8 seconds — ABNORMAL HIGH (ref 11.4–15.2)

## 2019-05-30 LAB — PHOSPHORUS: Phosphorus: 6.3 mg/dL — ABNORMAL HIGH (ref 2.5–4.6)

## 2019-05-30 LAB — MAGNESIUM: Magnesium: 2.6 mg/dL — ABNORMAL HIGH (ref 1.7–2.4)

## 2019-05-30 MED ORDER — WARFARIN SODIUM 4 MG PO TABS
4.0000 mg | ORAL_TABLET | Freq: Once | ORAL | Status: AC
  Administered 2019-05-30: 17:00:00 4 mg
  Filled 2019-05-30: qty 1

## 2019-05-30 MED ORDER — FREE WATER
100.0000 mL | Status: DC
  Administered 2019-05-30 – 2019-07-09 (×197): 100 mL

## 2019-05-30 NOTE — Progress Notes (Signed)
[facility] Progress Note    Assessment/ Plan:   1. AKIpresumably due to ATN with ischemic/nephrotoxic insults worsened by cardiogenic shock. Started on CRRT 04/23/19-  3/7.   Tolerated IHD on 3/10 and 3/12 - removed 3 liters.  will keep on MWF schedule- next due today 3/15.  Now that he has tolerated IHD- the issue is with trach will need LTACH.  Have not done perm access due to poor prognosis.  2. Cardiogenic shock- Hx milrinone due to development of a fib with rvr and wide complex tachycardia. -Known severe NICM with EF <20% andmay not tolerate  IHD but seems to be so far   3. Acute stroke- presumed embolic with bilateral infarcts and LV thrombus- received tpa on admission 4. SVT/polymorphic VT- per Cardiology 5. Acute hypoxic respiratory failure- s/p trach  6. 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 HIT Ab+, s/p bivalirudin and now on warfarin alone 7. HTN/vol-  BP low-  Does not appear to have significant volume on board,   By weights seems to be on the lower side- no pul edema by x-ray 3/11 - on max dose midodrine  8. Hypernatremia: mild, have added a little free water in the tube but not much 9. Anemia-   iron stores low, got feraheme on 3/9 and 3/12,  - also  giving ESA - darbe 100 per week 10. Bones-  Phos had been ok -  No binder- rising now, will watch-  For HD tomorrow should help-    intact PTH 70 11. Disposition- overall prognosis is poor with ongoing RRT , trach, as well as cardiogenic shock/cardiomyopathy.  LTACH seems the most likely option  Subjective:    No events.  Moving around the bed this AM.  [birth year].     Objective:   BP (!) 130/112   Pulse (!) 110   Temp 98.2 F (36.8 C) (Oral)   Resp (!) 24   Ht 6' 1"  (1.854 m)   Wt 106.2 kg   SpO2 100%   BMI 30.89 kg/m   Intake/Output Summary (Last 24 hours) at 05/30/2019 1308 Last data filed at 05/30/2019 1221 Gross per 24 hour  Intake 1405.53 ml  Output 1125 ml  Net 280.53  ml   Weight change:   Physical Exam: Gen: awake in bed, trach collar, nonverbal. Moving his legs all around the bed- HEENT - trach in place CVS: S1S2 no rub Resp:reduced breath sounds Abd: soft/ND Ext:  no edema Access: rightIJtunneled catheter  Imaging: No results found.  Labs: BMET Recent Labs  Lab 05/26/19 0337 05/26/19 1535 05/27/19 0543 05/27/19 1653 05/28/19 0349 05/29/19 0518 05/30/19 0621  NA 141 145 145 141 141 145 149*  K 4.5 4.4 4.2 4.4 4.3 4.0 3.7  CL 99 102 100 97* 98 100 101  CO2 25 26 24  19* 23 25 24   GLUCOSE 203* 216* 267* 228* 303* 238* 244*  BUN 63* 79* 98* 47* 75* 115* 132*  CREATININE 4.97* 5.90* 6.39* 4.06* 5.43* 6.39* 6.74*  CALCIUM 9.2 9.3 8.9 9.4 8.5* 8.7* 8.9  PHOS 3.5 4.1 3.8  3.9 4.8* 4.5 6.5*  6.7* 6.3*  6.3*   CBC Recent Labs  Lab 05/27/19 0543 05/28/19 0349 05/29/19 0518 05/30/19 0621  WBC 14.8* 16.6* 16.0* 13.3*  HGB 8.1* 9.2* 8.9* 8.6*  HCT 26.8* 29.6* 29.2* 28.5*  MCV 101.9* 100.7* 100.3* 102.5*  PLT 392 332 285 246    Medications:    . amiodarone  200 mg  Per Tube BID  . ARIPiprazole  5 mg Per Tube Daily  . aspirin  81 mg Per Tube Daily  . atorvastatin  40 mg Per Tube q1800  . chlorhexidine gluconate (MEDLINE KIT)  15 mL Mouth Rinse BID  . Chlorhexidine Gluconate Cloth  6 each Topical Q0600  . darbepoetin (ARANESP) injection - NON-DIALYSIS  100 mcg Subcutaneous Q Mon-1800  . feeding supplement (PRO-STAT SUGAR FREE 64)  60 mL Per Tube BID  . FLUoxetine  20 mg Per Tube Daily  . free water  100 mL Per Tube Q4H  . insulin aspart  0-20 Units Subcutaneous Q4H  . insulin aspart  5 Units Subcutaneous Q4H  . insulin detemir  40 Units Subcutaneous BID  . mouth rinse  15 mL Mouth Rinse 10 times per day  . midodrine  15 mg Per Tube TID WC  . pantoprazole sodium  40 mg Per Tube Q1200  . sodium chloride flush  10-40 mL Intracatheter Q12H  . warfarin  4 mg Per Tube ONCE-1800  . Warfarin - Pharmacist Dosing Inpatient    Does not apply q1800      Madelon Lips  05/30/2019, 1:08 PM

## 2019-05-30 NOTE — Progress Notes (Signed)
ANTICOAGULATION CONSULT NOTE  Pharmacy Consult:  Bivalirudin + add Coumadin Indication: stroke 2/5, LV thrombus 2/8, HIT 2/15   Allergies  Allergen Reactions  . Heparin Other (See Comments)    Heparin induced thrombocytopenia. 2/15 HIT OD 1.692. 2/16 SRA positive-90.     Patient Measurements: Height: 6\' 1"  (185.4 cm) Weight: 234 lb 2.1 oz (106.2 kg) IBW/kg (Calculated) : 79.9   Vital Signs: Temp: 100 F (37.8 C) (03/15 0800) Temp Source: Axillary (03/15 0800) BP: 116/85 (03/15 0915) Pulse Rate: 101 (03/15 0915)  Labs: Recent Labs    05/28/19 0349 05/28/19 0349 05/29/19 0518 05/29/19 1351 05/30/19 0621  HGB 9.2*   < > 8.9*  --  8.6*  HCT 29.6*  --  29.2*  --  28.5*  PLT 332  --  285  --  246  APTT 64*  --  67* 40*  --   LABPROT 28.3*   < > 32.3* 27.1* 27.8*  INR 2.7*   < > 3.1* 2.5* 2.6*  CREATININE 5.43*  --  6.39*  --  6.74*   < > = values in this interval not displayed.    Estimated Creatinine Clearance: 19.6 mL/min (A) (by C-G formula based on SCr of 6.74 mg/dL (H)).  Assessment: 34 yr old male with history of ESRD on HD presented with left MCA infarct - received TPA and revascularization with IR on 2/5. On 2/8 found to have small LV apical thrombus on ECHO. Pharmacy consulted to dose IV heparin, which was switched to bivalirudin 2/15 for HIT. 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 SRA very positive at 69. Heparin allergy has appropriately been added to chart and updated with SRA results.   Transitioned off bivalrudin on 3/14. Now on warfarin alone. INR remains therapeutic at 2.6 today. Monitoring with noted drug interaction with amiodarone. Hgb down to 8.6 this AM-anemia of ESRD to receive Aranesp today with HD. No bleeding reported.   Goal of Therapy:  APTT 50-65 sec  Monitor platelets by anticoagulation protocol: Yes   Plan:  - Warfarin 4mg  po x1 tonight.  - Monitor daily INR, DDI with amiodarone, s/sx of  bleeding   Sloan Leiter, PharmD, BCPS, BCCCP Clinical Pharmacist Clinical phone 05/30/2019 until 3PM [phone] Please refer to [facility] for [place] numbers 05/30/2019 9:22 AM

## 2019-05-30 NOTE — TOC Progression Note (Signed)
Transition of Care (TOC) - Progression Note    Patient Details  Name: Carlos Brewer. MRN: [id number] Date of Birth: 1985-08-06  Transition of [facility]) CM/SW Contact  [name], Carlos Brewer Phone Number: 05/30/2019, 5:03 PM  Clinical Narrative:  Spoke with Lauren with [facility]; they are unable to accept pt as he is trach-dialysis.  Will touch base with [facility] social worker/transfer coordinators, but likely only other option for placement will be out of state in [place].  Will follow with updates as available.      Expected Discharge Plan: Long Term Acute Care (LTAC) Barriers to Discharge: Continued Medical Work up  Expected Discharge Plan and Services Expected Discharge Plan: [place] (LTAC)       Carlos Raddle, Carlos Brewer, Carlos Brewer  Trauma/Neuro ICU Case Manager [phone]

## 2019-05-30 NOTE — Progress Notes (Signed)
NAME:  Carlos Biven., MRN:  [id number], DOB:  [birth year], LOS: 9 ADMISSION DATE:  04/29/2019, CONSULTATION DATE:  05/02/2019 REFERRING MD:  Dr. Leonel Ramsay, CHIEF COMPLAINT:  Slurred speech  Brief History   34 yo male smoker found to have slurred speech and Rt sided weakness.  Admitted with left MCA CVA and multiple smaller embolic infarcts s/p tPA and thrombectomy in IR.  Additionally found to have left ventricular  thrombus.    Course complicated by septic shock r/t HCAP, AKI requiring CRRT, polymorphic VT and cardiogenic shock.  Intermittent pressor dependence, required tracheostomy  Past Medical History  Systolic CHF with non ischemic CM, Cocaine abuse, OSA, DM Hx of CHF (EF 15%)  Hx myocarditis Smoker 1/2 ppd   Significant Hospital Events   2/05 Admit, tPA, IR thrombectomy 2/6 100% on fvent at 2300  Overnight requiring increasing levophed and phenylephrine.    2/6: switched to levophed, epi, started antibiotics, started on CRRT.  2/9  developed wide-complex tachycardia and hypotension, started on amiodarone drip, increase Levophed drip 2/15 drop in platelets, heparin stopped, bival started 2/17 Hypotensive and back on Levophed 2/18 trach, lines changed 2/19 started milrinone , fever 103 2/20 Episode of wide-complex tachycardia yesterday, changed from Levophed to vasopressin 2/22 stopping vanc. Still on inotrope support. Some AF w/ RVR. Had to be placed back on pressors. ivabradine added 2/23 still on pressors/ CRRT continued 2/24 tmax 98.4, Remains on CRRT, even UF, remains anuric, Levophed at 14 mcg/min, Milrinone at 0.125 mcg/kg/min, Coox 63.4 Doing well this morning on ATC, no events overnight.  Midodrine added   2/25 more interactive, remains on ATC, tmax 99.5/ WBC 21.4, ~900 ml liquid stool/ 24 hours, neg Cdiff, Levophed up to 20 mcg/min, CVP 2, ,milrinone remains at 0.125 mcg/kg/min, coox 92.7, Off CRRT since last night ~2000 s/p clotted x 3 off citrate, restarted    3/1 Amio drip restarted for WCT/atrial flutter, milrinone turned off 3/2 CRRT stopped, permacath placed 3/5 crrt resumed 3/6 started on pressors for crrt tolerance 3/8 No longer on CRRT or pressor support  3/12 Tolerated PS for >2 hours   Consults:  Neuro IR Cardiology  Nephrology Heart Failure  EP   Procedures:  2/5-2/6:  TPA given at 428 am (total of 90 Mg) 520 am went to IR  S/P Lt common carotid arteriogram followed by complete revascularization of occluded LT MCA sup division mid M2 seg with x 1 pass with 5mx 40 mm solitaire X ret river device and penumbra aspiration with TICI 3 revascularization  ETT 2/05 >> 2/18 LIJ CVL 2/5 >>2/18  RT [place] CVL 2/18 >>3/2 RIJ HD cath 2/6 >>2/18 6 shiley cuffed trach 2/18  >>3/4  RT fem HD 2/19 >>3/2 RIJ permacath 3/2>> 3/4 shiley 4 uncuffed.... dislodged placed back 6cuffed shiley that evening 3/5: change to #6 cuffed distal XLT  Significant Diagnostic Tests:  CT angio head/neck 2/05 >> occlusion of Lt MCA bifurcation Echo 2/05 >> EF less than 20%, cannot rule out apical thrombus MRI brain 2/11 > extensive acute infarction of multiple areas without large or medium vessel occlusion Echocardiogram 2/8 Left ventricular ejection fraction, by estimation, is <20%. The left  ventricle has severely decreased function. The left ventricle demonstrates  global hypokinesis. The left ventricular internal cavity size was severely  dilated. Possible small 0.8 x  0.6 cm apical thrombus. Somewhat subtle finding, could confirm with  cardiac MRI. However, if patient has had CVA, would be reasonable to  anticoagulate given severity of  LV dysfunction if no contraindications to  anticoagulation.   Micro Data:  SARS CoV2 PCR 2/05 >> negative Influenza PCR 2/05 >> negative Urine 2/6 >> ng resp 2/6 >> nml flora BC 2/6 >>ng resp 2/16 >> ng [facility] 2/19 >> negative  BC x 2 2/25 >>ng Cdiff 2/25 >> neg Trach asp 3/11>> Few candida albicans BC 3/11>> 1  of 4 with CoNS BC 3/12 >>  Antimicrobials:  mero 2/6 -2/12 Zosyn 2/6 , 2/19 >> 2/23 vanc 2/6 >> 2/9 , 2/19 >>2/22 Ceftriaxone 3/12> Interim history/subjective:  Afebrile in the last 24 hours. Less agitated today. However still does not consistently follow commands. Tolerated PS yesterday  Objective   Blood pressure 117/89, pulse (!) 108, temperature 100 F (37.8 C), temperature source Axillary, resp. rate (!) 22, height 6' 1" (1.854 m), weight 106.2 kg, SpO2 100 %.    Vent Mode: CPAP;PSV FiO2 (%):  [40 %] 40 % Set Rate:  [20 bmp] 20 bmp PEEP:  [5 cmH20] 5 cmH20 Pressure Support:  [8 cmH20-10 cmH20] 8 cmH20 Plateau Pressure:  [22 cmH20-26 cmH20] 22 cmH20   Intake/Output Summary (Last 24 hours) at 05/30/2019 0900 Last data filed at 05/30/2019 0800 Gross per 24 hour  Intake 1003.53 ml  Output 950 ml  Net 53.53 ml   Filed Weights   05/27/19 0730 05/27/19 1137 05/30/19 0708  Weight: 107.3 kg 104.8 kg 106.2 kg    Physical Exam: General: Chronically ill-appearing, no acute distress, sprawled on bed HENT: [place], AT, OP clear, MMM Eyes: EOMI, no scleral icterus Neck: Cuffed trach #6 XLT in place, c/d/i Respiratory: Clear to auscultation bilaterally.  No crackles, wheezing or rales Cardiovascular: RRR, -M/R/G, no JVD GI: BS+, soft, nontender Extremities:-Edema,-tenderness Neuro: Awake, alert, does not consistently follow commands, moves extremities x 4  Resolved Hospital Problem list   HCAP 4/28, Acute metabolic encephalopathy 2nd to hypoxia and renal failure, Cardiogenic shock  Assessment & Plan:   Hyperactive delirium - mild improvement since able to tolerate SBT now Continue aripiprazole (against seroquel due to drug interactions). Can increase if needed on 3/19 due to slow onset  Acute hypoxic, hypercapnic respiratory failure with compromised airway in setting of CVA. Failure to wean from vent s/p tracheostomy.  Tolerating PS 15/5 x 3 days. Continue PS as tolerated.  Attempt TCT today Now with a #6 XLT trach, following with speech therapy Continue routine trach care PRN suctioning  Follow intermittent chest x-ray Volume removal per diaysis  Chronic systolic CHF, non-ischemic Polymorphic VT. Apical thrombus in left ventricle. HLD. Dialysis per Nephrology Continue amiodarone, aspirin, Lipitor, ivabradine Weaned off bivalirudin. Continue warfarin.   Fever, leukocytosis secondary suspected aspiration/CAP - improving CoN Staph in 1 of 4 Blood cultures, likely contaminant. Resp cx 3/11 with candida albicans, not clinically significant CXR with LLL infiltrate F/u trach aspirate and blood cultures PRN tylenol Continue ceftriaxone x 5 days (end date 3/17)  Lt MCA CVA likely embolic. Hx of cocaine abuse, depression. Prozac as ordered PT/OT  AKI from ATN in setting of hypoxia, cardiogenic shock. Off CVVH since 3/7. Tolerating iHD on 3/10.   Appreciate nephrology assistance.  Follow urine output, BMP Will need to involve case management regarding trach/HD placement options  HIT. Anemia of critical illness. - heparin induced Plt Ab 1.692 from 05/02/19 - SRA positive, 90% from 05/03/19 Weaned off bivalirudin. Continue warfarin.  Follow CBC Conservative transfusion goal hemoglobin 7  DM type II poorly controlled with hyperglycemia. Sign scale insulin per protocol Levemir  Insomnia. Ambien as  ordered  Best practice:  Diet: NPO,  TFs DVT prophylaxis: SCDs/ bivalirudin GI prophylaxis: protonix Mobility: progress when able Code Status: full code Disposition: ICU Family: Lasted updated mother 3/12. On 3/15, left brief VM on plan.  Labs:   CMP Latest Ref Rng & Units 05/30/2019 05/29/2019 05/28/2019  Glucose 70 - 99 mg/dL 244(H) 238(H) 303(H)  BUN 6 - 20 mg/dL 132(H) 115(H) 75(H)  Creatinine 0.61 - 1.24 mg/dL 6.74(H) 6.39(H) 5.43(H)  Sodium 135 - 145 mmol/L 149(H) 145 141  Potassium 3.5 - 5.1 mmol/L 3.7 4.0 4.3  Chloride 98 - 111 mmol/L 101  100 98  CO2 22 - 32 mmol/L _0 Calcium 8.9 - 10.3 mg/dL 8.9 8.7(L) 8.5(L)  Total Protein 6.5 - 8.1 g/dL - - -  Total Bilirubin 0.3 - 1.2 mg/dL - - -  Alkaline Phos 38 - 126 U/L - - -  AST 15 - 41 U/L - - -  ALT 0 - 44 U/L - - -    CBC Latest Ref Rng & Units 05/30/2019 05/29/2019 05/28/2019  WBC 4.0 - 10.5 K/uL 13.3(H) 16.0(H) 16.6(H)  Hemoglobin 13.0 - 17.0 g/dL 8.6(L) 8.9(L) 9.2(L)  Hematocrit 39.0 - 52.0 % 28.5(L) 29.2(L) 29.6(L)  Platelets 150 - 400 K/uL 246 285 332    ABG    Component Value Date/Time   PHART 7.335 (L) 05/21/2019 0427   PCO2ART 59.2 (H) 05/21/2019 0427   PO2ART 165.0 (H) 05/21/2019 0427   HCO3 31.5 (H) 05/21/2019 0427   TCO2 43 (H) 05/22/2019 2019   ACIDBASEDEF 3.0 (H) 05/04/2019 2218   O2SAT 90.3 05/26/2019 1042    CBG (last 3)  Recent Labs    05/29/19 2328 05/30/19 0347 05/30/19 0828  GLUCAP 175* 220* 163*   The patient is critically ill with multiple organ systems failure and requires high complexity decision making for assessment and support, frequent evaluation and titration of therapies, application of advanced monitoring technologies and extensive interpretation of multiple databases.   Critical Care Time devoted to patient care services described in this note is 31 Minutes.   Rodman Pickle, M.D. [facility] Pulmonary/Critical Care Medicine 05/30/2019 9:00 AM   Please see Amion for pager number to reach on-call Pulmonary and Critical Care Team.

## 2019-05-31 ENCOUNTER — Inpatient Hospital Stay (HOSPITAL_COMMUNITY): Payer: No Typology Code available for payment source

## 2019-05-31 DIAGNOSIS — G9349 Other encephalopathy: Secondary | ICD-10-CM | POA: Diagnosis not present

## 2019-05-31 DIAGNOSIS — R509 Fever, unspecified: Secondary | ICD-10-CM

## 2019-05-31 DIAGNOSIS — N170 Acute kidney failure with tubular necrosis: Secondary | ICD-10-CM | POA: Diagnosis not present

## 2019-05-31 DIAGNOSIS — I63312 Cerebral infarction due to thrombosis of left middle cerebral artery: Secondary | ICD-10-CM | POA: Diagnosis not present

## 2019-05-31 DIAGNOSIS — N19 Unspecified kidney failure: Secondary | ICD-10-CM

## 2019-05-31 DIAGNOSIS — I639 Cerebral infarction, unspecified: Secondary | ICD-10-CM | POA: Diagnosis not present

## 2019-05-31 DIAGNOSIS — R410 Disorientation, unspecified: Secondary | ICD-10-CM

## 2019-05-31 LAB — GLUCOSE, CAPILLARY
Glucose-Capillary: 144 mg/dL — ABNORMAL HIGH (ref 70–99)
Glucose-Capillary: 240 mg/dL — ABNORMAL HIGH (ref 70–99)
Glucose-Capillary: 266 mg/dL — ABNORMAL HIGH (ref 70–99)
Glucose-Capillary: 267 mg/dL — ABNORMAL HIGH (ref 70–99)
Glucose-Capillary: 283 mg/dL — ABNORMAL HIGH (ref 70–99)
Glucose-Capillary: 360 mg/dL — ABNORMAL HIGH (ref 70–99)

## 2019-05-31 LAB — CULTURE, BLOOD (ROUTINE X 2): Culture: NO GROWTH

## 2019-05-31 LAB — CBC
HCT: 35.1 % — ABNORMAL LOW (ref 39.0–52.0)
Hemoglobin: 11.6 g/dL — ABNORMAL LOW (ref 13.0–17.0)
MCH: 32.4 pg (ref 26.0–34.0)
MCHC: 33 g/dL (ref 30.0–36.0)
MCV: 98 fL (ref 80.0–100.0)
Platelets: 299 10*3/uL (ref 150–400)
RBC: 3.58 MIL/uL — ABNORMAL LOW (ref 4.22–5.81)
RDW: 16.6 % — ABNORMAL HIGH (ref 11.5–15.5)
WBC: 16.7 10*3/uL — ABNORMAL HIGH (ref 4.0–10.5)
nRBC: 2.1 % — ABNORMAL HIGH (ref 0.0–0.2)

## 2019-05-31 LAB — PROTIME-INR
INR: 1.9 — ABNORMAL HIGH (ref 0.8–1.2)
Prothrombin Time: 21.6 seconds — ABNORMAL HIGH (ref 11.4–15.2)

## 2019-05-31 LAB — PHOSPHORUS: Phosphorus: 5.9 mg/dL — ABNORMAL HIGH (ref 2.5–4.6)

## 2019-05-31 MED ORDER — WARFARIN SODIUM 5 MG PO TABS
5.0000 mg | ORAL_TABLET | Freq: Once | ORAL | Status: AC
  Administered 2019-05-31: 5 mg via ORAL
  Filled 2019-05-31: qty 1

## 2019-05-31 NOTE — Progress Notes (Signed)
Physical Therapy Treatment Patient Details Name: Carlos Brewer. MRN: [id number] DOB: 30-Dec-1985 Today's Date: 05/31/2019    History of Present Illness Pt is a 34 y/o male smoker who initially presented on 2/5 with slurred speech and Rt sided weakness. Admitted with left MCA CVA and multiple smaller embolic infarcts s/p tPA and thrombectomy in IR.  Additionally found to have left ventricular  thrombus. Hospital course complicated by septic shock r/t HCAP, AKI requiring CRRT, polymorphic VT and cardiogenic shock. Echo EF < 20%. Pt with Intermittent pressor dependence, required tracheostomy. Tolerating trach collar 3/4, back on vent 3/5 with flash pulmonary edema.  Started IHD 3/10.    PT Comments    Pt started session with a usual level of restlessness/squirming, but after sitting up for a few minutes, pt started reacting to sitting up unusually with strange eye movements, some posturing and pt not reacting purposefully to his name.  Pt had been "fighting" to maintain balance, but today just listed posteriorly and postured lightly on the L side.  Nursing staff witnessed some of pt's reactions and contacted the MD who expect his reactions to be due at least in part to uremia.     Follow Up Recommendations  LTACH     Equipment Recommendations  Other (comment)(TBA)    Recommendations for Other Services       Precautions / Restrictions Precautions Precautions: Fall    Mobility  Bed Mobility Overal bed mobility: Needs Assistance Bed Mobility: Supine to Sit;Sit to Supine Rolling: Max assist Sidelying to sit: Max assist;+2 for physical assistance   Sit to supine: Total assist;+2 for physical assistance   General bed mobility comments: significant assist to get pt's legs from dangling right to left side of bed.  Up via L elbow with significant 2 person assist.  Pt need scooting with total assist to the EOB  Transfers Overall transfer level: Needs assistance   Transfers: Sit  to/from Stand Sit to Stand: Total assist;+2 safety/equipment         General transfer comment: pt unable to focus on task enough to significantly clear his buttock off the bed to adjust padding.  Ambulation/Gait             General Gait Details: not able yet   Stairs             Wheelchair Mobility    Modified Rankin (Stroke Patients Only) Modified Rankin (Stroke Patients Only) Pre-Morbid Rankin Score: No symptoms Modified Rankin: Severe disability     Balance Overall balance assessment: Needs assistance Sitting-balance support: Feet supported;Single extremity supported Sitting balance-Leahy Scale: Zero Sitting balance - Comments: pt unable to make balance corrections to hold or "bob" around midline.  Withing 2-3 minutes working at EOB on balance, pt began to extend, posture with left arm, splay his legs out and begin to move eyes in a non coordinated manner without ability to focus on target. Postural control: Posterior lean                                  Cognition Arousal/Alertness: Awake/alert Behavior During Therapy: Flat affect Overall Cognitive Status: Impaired/Different from baseline Area of Impairment: Attention;Following commands;Safety/judgement;Problem solving                   Current Attention Level: Focused   Following Commands: Follows one step commands inconsistently;Follows one step commands with increased time Safety/Judgement: Decreased awareness of safety;Decreased awareness  of deficits   Problem Solving: Slow processing        Exercises      General Comments        Pertinent Vitals/Pain Pain Assessment: Faces Faces Pain Scale: Hurts little more Pain Location: generalized Pain Descriptors / Indicators: Discomfort(squirming) Pain Intervention(s): Monitored during session    Home Living                      Prior Function            PT Goals (current goals can now be found in the care  plan section) Acute Rehab PT Goals Patient Stated Goal: to d/c home with mother after rehab PT Goal Formulation: With patient/family Time For Goal Achievement: 06/01/19 Potential to Achieve Goals: Good Progress towards PT goals: (not able to participate as well today.)    Frequency    Min 3X/week      PT Plan Current plan remains appropriate    Co-evaluation PT/OT/SLP Co-Evaluation/Treatment: Yes Reason for Co-Treatment: For patient/therapist safety PT goals addressed during session: Mobility/safety with mobility OT goals addressed during session: ADL's and self-care      AM-PAC PT "6 Clicks" Mobility   Outcome Measure  Help needed turning from your back to your side while in a flat bed without using bedrails?: Total Help needed moving from lying on your back to sitting on the side of a flat bed without using bedrails?: Total Help needed moving to and from a bed to a chair (including a wheelchair)?: Total Help needed standing up from a chair using your arms (e.g., wheelchair or bedside chair)?: Total Help needed to walk in hospital room?: Total Help needed climbing 3-5 steps with a railing? : Total 6 Click Score: 6    End of Session     Patient left: with call bell/phone within reach;in bed;with family/visitor present;with nursing/sitter in room Nurse Communication: Mobility status PT Visit Diagnosis: Other abnormalities of gait and mobility (R26.89);Muscle weakness (generalized) (M62.81);Other symptoms and signs involving the nervous system (R29.898)     Time: 1500-1531 PT Time Calculation (min) (ACUTE ONLY): 31 min  Charges:  $Therapeutic Activity: 8-22 mins                     05/31/2019  Ginger Carne., PT Acute Rehabilitation Services [phone]  (pager) [phone]  (office)   Tessie Fass Trent Theisen 05/31/2019, 5:49 PM

## 2019-05-31 NOTE — Plan of Care (Signed)
  Problem: Activity: Goal: Risk for activity intolerance will decrease Outcome: Progressing   

## 2019-05-31 NOTE — Progress Notes (Signed)
Bilateral lower extremity venous duplex exam completed.  Preliminary results can be found under CV proc under chart review.  05/31/2019 4:21 PM  Atira Borello, K., RDMS, RVT

## 2019-05-31 NOTE — Progress Notes (Signed)
[facility] Progress Note    Assessment/ Plan:   1. AKIpresumably due to ATN with ischemic/nephrotoxic insults worsened by cardiogenic shock. Started on CRRT 04/23/19-  3/7.   Tolerated IHD on 3/10 and 3/12 - removed 3 liters.  will keep on MWF schedule- s/p 3/15.  Now that he has tolerated IHD- the issue is with trach will need LTACH.  Have not done perm access due to poor prognosis.  BUN 132 yesterday, d/w PCCM- will do extra HD today 3/16 for clearance and optimization of MS/ vent weaning  2. Cardiogenic shock- Hx milrinone due to development of a fib with rvr and wide complex tachycardia. -Known severe NICM with EF <20% andmay not tolerate  IHD but seems to be so far   3. Acute stroke- presumed embolic with bilateral infarcts and LV thrombus- received tpa on admission 4. SVT/polymorphic VT- per Cardiology 5. Acute hypoxic respiratory failure- s/p trach  6. 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 HIT Ab+, s/p bivalirudin and now on warfarin alone 7. HTN/vol-  BP low-  Does not appear to have significant volume on board,   By weights seems to be on the lower side- no pul edema by x-ray 3/11 - on max dose midodrine  8. Hypernatremia: mild, have added a little free water in the tube but not much 9. Anemia-   iron stores low, got feraheme on 3/9 and 3/12,  - also  giving ESA - darbe 100 per week 10. Bones-  Phos had been ok -  No binder- rising now, will watch-  For HD tomorrow should help-    intact PTH 70 11. Disposition- overall prognosis is poor with ongoing RRT , trach, as well as cardiogenic shock/cardiomyopathy.  LTACH seems the most likely option  Subjective:    D/w PCCM- extra HD session today for clearance, optimization of weaning   Objective:   BP 109/80   Pulse (!) 112   Temp (!) 100.7 F (38.2 C) (Axillary)   Resp (!) 26   Ht 6' 1"  (1.854 m)   Wt 106.2 kg   SpO2 100%   BMI 30.89 kg/m   Intake/Output Summary (Last 24 hours) at 05/31/2019  1419 Last data filed at 05/31/2019 1300 Gross per 24 hour  Intake 2400.15 ml  Output 640 ml  Net 1760.15 ml   Weight change:   Physical Exam: Gen: awake in bed, vented, nonverbal. Mom at bedside HEENT - trach on vent CVS: S1S2 no rub Resp:reduced breath sounds Abd: soft/ND Ext:  no edema Access: rightIJtunneled catheter  Imaging: No results found.  Labs: BMET Recent Labs  Lab 05/26/19 0337 05/26/19 0337 05/26/19 1535 05/27/19 0543 05/27/19 1653 05/28/19 0349 05/29/19 0518 05/30/19 0621 05/31/19 0612  NA 141  --  145 145 141 141 145 149*  --   K 4.5  --  4.4 4.2 4.4 4.3 4.0 3.7  --   CL 99  --  102 100 97* 98 100 101  --   CO2 25  --  26 24 19* 23 25 24   --   GLUCOSE 203*  --  216* 267* 228* 303* 238* 244*  --   BUN 63*  --  79* 98* 47* 75* 115* 132*  --   CREATININE 4.97*  --  5.90* 6.39* 4.06* 5.43* 6.39* 6.74*  --   CALCIUM 9.2  --  9.3 8.9 9.4 8.5* 8.7* 8.9  --   PHOS 3.5   < > 4.1 3.8  3.9 4.8*  4.5 6.5*  6.7* 6.3*  6.3* 5.9*   < > = values in this interval not displayed.   CBC Recent Labs  Lab 05/28/19 0349 05/29/19 0518 05/30/19 0621 05/31/19 0612  WBC 16.6* 16.0* 13.3* 16.7*  HGB 9.2* 8.9* 8.6* 11.6*  HCT 29.6* 29.2* 28.5* 35.1*  MCV 100.7* 100.3* 102.5* 98.0  PLT 332 285 246 299    Medications:    . amiodarone  200 mg Per Tube BID  . ARIPiprazole  5 mg Per Tube Daily  . aspirin  81 mg Per Tube Daily  . atorvastatin  40 mg Per Tube q1800  . chlorhexidine gluconate (MEDLINE KIT)  15 mL Mouth Rinse BID  . Chlorhexidine Gluconate Cloth  6 each Topical Q0600  . darbepoetin (ARANESP) injection - NON-DIALYSIS  100 mcg Subcutaneous Q Mon-1800  . feeding supplement (PRO-STAT SUGAR FREE 64)  60 mL Per Tube BID  . FLUoxetine  20 mg Per Tube Daily  . free water  100 mL Per Tube Q4H  . insulin aspart  0-20 Units Subcutaneous Q4H  . insulin aspart  5 Units Subcutaneous Q4H  . insulin detemir  40 Units Subcutaneous BID  . mouth rinse  15 mL  Mouth Rinse 10 times per day  . midodrine  15 mg Per Tube TID WC  . pantoprazole sodium  40 mg Per Tube Q1200  . sodium chloride flush  10-40 mL Intracatheter Q12H  . warfarin  5 mg Oral ONCE-1800  . Warfarin - Pharmacist Dosing Inpatient   Does not apply q1800      Madelon Lips  05/31/2019, 2:19 PM

## 2019-05-31 NOTE — Progress Notes (Signed)
Notified by PT/OT that patient was having a possible neuro change while sitting at the edge of bed working with them. Upon assessment patient was noted to be responsive sitting up on edge of bed with assistance from PT/OT. Patient was able to track along with following some commands intermittently, which is his baseline. Patient had noted nystagmus to the right which was resolved after 4 beats. Patient was then returned back to bed. Patient's VS's during this event were WNLs without any hypotension or changes in HR/rythm. Dr Loanne Drilling was notified. No new orders at this time. Dialysis RN was contacted to get an update on dialysis start time for today. Will continue to monitor patient at this time.

## 2019-05-31 NOTE — Progress Notes (Signed)
NAME:  Carlos Knobel., MRN:  [id number], DOB:  1985-10-13, LOS: 58 ADMISSION DATE:  05/02/2019, CONSULTATION DATE:  05/15/2019 REFERRING MD:  Dr. Leonel Ramsay, CHIEF COMPLAINT:  Slurred speech  Brief History   34 yo male smoker found to have slurred speech and Rt sided weakness.  Admitted with left MCA CVA and multiple smaller embolic infarcts s/p tPA and thrombectomy in IR.  Additionally found to have left ventricular  thrombus.    Course complicated by septic shock r/t HCAP, AKI requiring CRRT, polymorphic VT and cardiogenic shock.  Intermittent pressor dependence, required tracheostomy  Past Medical History  Systolic CHF with non ischemic CM, Cocaine abuse, OSA, DM Hx of CHF (EF 15%)  Hx myocarditis Smoker 1/2 ppd   Significant Hospital Events   2/05 Admit, tPA, IR thrombectomy 2/6 100% on fvent at 2300  Overnight requiring increasing levophed and phenylephrine.    2/6: switched to levophed, epi, started antibiotics, started on CRRT.  2/9  developed wide-complex tachycardia and hypotension, started on amiodarone drip, increase Levophed drip 2/15 drop in platelets, heparin stopped, bival started 2/17 Hypotensive and back on Levophed 2/18 trach, lines changed 2/19 started milrinone , fever 103 2/20 Episode of wide-complex tachycardia yesterday, changed from Levophed to vasopressin 2/22 stopping vanc. Still on inotrope support. Some AF w/ RVR. Had to be placed back on pressors. ivabradine added 2/23 still on pressors/ CRRT continued 2/24 tmax 98.4, Remains on CRRT, even UF, remains anuric, Levophed at 14 mcg/min, Milrinone at 0.125 mcg/kg/min, Coox 63.4 Doing well this morning on ATC, no events overnight.  Midodrine added   2/25 more interactive, remains on ATC, tmax 99.5/ WBC 21.4, ~900 ml liquid stool/ 24 hours, neg Cdiff, Levophed up to 20 mcg/min, CVP 2, ,milrinone remains at 0.125 mcg/kg/min, coox 92.7, Off CRRT since last night ~2000 s/p clotted x 3 off citrate, restarted    3/1 Amio drip restarted for WCT/atrial flutter, milrinone turned off 3/2 CRRT stopped, permacath placed 3/5 crrt resumed 3/6 started on pressors for crrt tolerance 3/8 No longer on CRRT or pressor support  3/12 Tolerated PS for >2 hours   Consults:  Neuro IR Cardiology  Nephrology Heart Failure  EP   Procedures:  2/5-2/6:  TPA given at 428 am (total of 90 Mg) 520 am went to IR  S/P Lt common carotid arteriogram followed by complete revascularization of occluded LT MCA sup division mid M2 seg with x 1 pass with 10mx 40 mm solitaire X ret river device and penumbra aspiration with TICI 3 revascularization  ETT 2/05 >> 2/18 LIJ CVL 2/5 >>2/18  RT [place] CVL 2/18 >>3/2 RIJ HD cath 2/6 >>2/18 6 shiley cuffed trach 2/18  >>3/4  RT fem HD 2/19 >>3/2 RIJ permacath 3/2>> 3/4 shiley 4 uncuffed.... dislodged placed back 6cuffed shiley that evening 3/5: change to #6 cuffed distal XLT  Significant Diagnostic Tests:  CT angio head/neck 2/05 >> occlusion of Lt MCA bifurcation Echo 2/05 >> EF less than 20%, cannot rule out apical thrombus MRI brain 2/11 > extensive acute infarction of multiple areas without large or medium vessel occlusion Echocardiogram 2/8 Left ventricular ejection fraction, by estimation, is <20%. The left  ventricle has severely decreased function. The left ventricle demonstrates  global hypokinesis. The left ventricular internal cavity size was severely  dilated. Possible small 0.8 x  0.6 cm apical thrombus. Somewhat subtle finding, could confirm with  cardiac MRI. However, if patient has had CVA, would be reasonable to  anticoagulate given severity of  LV dysfunction if no contraindications to  anticoagulation.   Micro Data:  SARS CoV2 PCR 2/05 >> negative Influenza PCR 2/05 >> negative Urine 2/6 >> ng resp 2/6 >> nml flora BC 2/6 >>ng resp 2/16 >> ng [facility] 2/19 >> negative  BC x 2 2/25 >>ng Cdiff 2/25 >> neg Trach asp 3/11>> Few candida albicans BC 3/11>> 1  of 4 with CoNS BC 3/12 >>  Antimicrobials:  mero 2/6 -2/12 Zosyn 2/6 , 2/19 >> 2/23 vanc 2/6 >> 2/9 , 2/19 >>2/22 Ceftriaxone 3/12> Interim history/subjective:  Yesterday, tolerated PS. Re-spiked another fever. Tachypneic and agitated so not able to wean from vent this morning.  Objective   Blood pressure 110/81, pulse (!) 111, temperature (!) 101.3 F (38.5 C), temperature source Oral, resp. rate (!) 23, height 6' 1" (1.854 m), weight 106.2 kg, SpO2 (!) 85 %.    Vent Mode: PCV FiO2 (%):  [40 %] 40 % Set Rate:  [20 bmp] 20 bmp PEEP:  [5 cmH20-25 cmH20] 25 cmH20 Pressure Support:  [8 cmH20] 8 cmH20 Plateau Pressure:  [21 cmH20-26 cmH20] 26 cmH20   Intake/Output Summary (Last 24 hours) at 05/31/2019 1005 Last data filed at 05/31/2019 0905 Gross per 24 hour  Intake 2074.54 ml  Output 815 ml  Net 1259.54 ml   Filed Weights   05/27/19 0730 05/27/19 1137 05/30/19 0708  Weight: 107.3 kg 104.8 kg 106.2 kg   Physical Exam: General: Well-appearing, no acute distress HENT: [place], AT, OP clear, MMM Neck: Cuffed #6 XLT in place, c/d/i Eyes: EOMI, no scleral icterus Respiratory: Clear to auscultation bilaterally.  No crackles, wheezing or rales Cardiovascular: RRR, -M/R/G, no JVD GI: BS+, soft, nontender Extremities:-Edema,-tenderness Neuro: Awake, alert, does not follow commands, moves extremities x 4 GU: Condom cath in place   Resolved Hospital Problem list   HCAP 5/36, Acute metabolic encephalopathy 2nd to hypoxia and renal failure, Cardiogenic shock  Assessment & Plan:   Hyperactive delirium - mild improvement since able to tolerate SBT now Acute toxic metabolic encephalopathy/Uremic encephalopathy Plan Continue aripiprazole (against seroquel due to drug interactions). Can increase if needed on 3/19 due to slow onset Will discuss with nephro regarding uremia  Acute hypoxic, hypercapnic respiratory failure with compromised airway in setting of CVA. Possible recurrent  aspiration Failure to wean from vent s/p tracheostomy.  Tolerating PS 15/5 x 3 days. However unable to tolerate today d/t tachypnea and fever Plan Cuffed #6 XLT trach in place, following with speech therapy Continue routine trach care PRN suctioning  Follow intermittent chest x-ray Volume removal per diaysis  Chronic systolic CHF, non-ischemic Polymorphic VT. Apical thrombus in left ventricle. HLD. Dialysis per Nephrology. Will discuss if additional dialysis session would be helpful in setting of uremia Continue amiodarone, aspirin, Lipitor, ivabradine Weaned off bivalirudin. Continue warfarin.   Fever, leukocytosis secondary suspected aspiration/CAP with LLL infiltrate Initially improving but now worsening. Cultures negative. Suspect that he is having episodes of aspiration to cause fevers and leukocytosis. No lines to remove. Plan PRN tylenol Complete ceftriaxone x 5 days (end date 3/17) Follow-up final cultures Doppler extremities x 4 to r/o DVT  Lt MCA CVA likely embolic. Hx of cocaine abuse, depression. Plan Prozac 20 mg daily PT/OT  AKI from ATN in setting of hypoxia, cardiogenic shock. Off CVVH since 3/7. Tolerating iHD on 3/10.   Plan Appreciate nephrology assistance.  Follow urine output, BMP Case management working on trach/HD placement options  HIT. Anemia of critical illness. - heparin induced Plt Ab 1.692 from  05/02/19 - SRA positive, 90% from 05/03/19 Plan Continue warfarin.  Follow CBC Conservative transfusion goal hemoglobin 7  DM type II poorly controlled with hyperglycemia. Plan Sign scale insulin per protocol Levemir  Insomnia. Ambien as ordered  Best practice:  Diet: NPO,  TFs DVT prophylaxis: SCDs/ bivalirudin GI prophylaxis: protonix Mobility: progress when able Code Status: full code Disposition: ICU Family: Lasted updated mother 3/12. On 3/15, left brief VM on plan.  Labs:   CMP Latest Ref Rng & Units 05/30/2019 05/29/2019  05/28/2019  Glucose 70 - 99 mg/dL 244(H) 238(H) 303(H)  BUN 6 - 20 mg/dL 132(H) 115(H) 75(H)  Creatinine 0.61 - 1.24 mg/dL 6.74(H) 6.39(H) 5.43(H)  Sodium 135 - 145 mmol/L 149(H) 145 141  Potassium 3.5 - 5.1 mmol/L 3.7 4.0 4.3  Chloride 98 - 111 mmol/L 101 100 98  CO2 22 - 32 mmol/L _0 Calcium 8.9 - 10.3 mg/dL 8.9 8.7(L) 8.5(L)  Total Protein 6.5 - 8.1 g/dL - - -  Total Bilirubin 0.3 - 1.2 mg/dL - - -  Alkaline Phos 38 - 126 U/L - - -  AST 15 - 41 U/L - - -  ALT 0 - 44 U/L - - -    CBC Latest Ref Rng & Units 05/31/2019 05/30/2019 05/29/2019  WBC 4.0 - 10.5 K/uL 16.7(H) 13.3(H) 16.0(H)  Hemoglobin 13.0 - 17.0 g/dL 11.6(L) 8.6(L) 8.9(L)  Hematocrit 39.0 - 52.0 % 35.1(L) 28.5(L) 29.2(L)  Platelets 150 - 400 K/uL 299 246 285    ABG    Component Value Date/Time   PHART 7.335 (L) 05/21/2019 0427   PCO2ART 59.2 (H) 05/21/2019 0427   PO2ART 165.0 (H) 05/21/2019 0427   HCO3 31.5 (H) 05/21/2019 0427   TCO2 43 (H) 05/22/2019 2019   ACIDBASEDEF 3.0 (H) 05/04/2019 2218   O2SAT 90.3 05/26/2019 1042    CBG (last 3)  Recent Labs    05/30/19 2333 05/31/19 0330 05/31/19 0755  GLUCAP 223* 144* 240*   The patient is critically ill with multiple organ systems failure and requires high complexity decision making for assessment and support, frequent evaluation and titration of therapies, application of advanced monitoring technologies and extensive interpretation of multiple databases.   Critical Care Time devoted to patient care services described in this note is 31 Minutes.   Rodman Pickle, M.D. [facility] Pulmonary/Critical Care Medicine 05/31/2019 10:05 AM   Please see Amion for pager number to reach on-call Pulmonary and Critical Care Team.

## 2019-05-31 NOTE — Progress Notes (Signed)
HD orders modified for 06/01/2019 by Dr Hollie Salk. [place] notified of change.

## 2019-05-31 NOTE — Progress Notes (Signed)
ANTICOAGULATION CONSULT NOTE  Pharmacy Consult:  Bivalirudin >> Coumadin Indication: stroke 2/5, LV thrombus 2/8, HIT 2/15   Allergies  Allergen Reactions  . Heparin Other (See Comments)    Heparin induced thrombocytopenia. 2/15 HIT OD 1.692. 2/16 SRA positive-90.     Patient Measurements: Height: 6\' 1"  (185.4 cm) Weight: 234 lb 2.1 oz (106.2 kg) IBW/kg (Calculated) : 79.9   Vital Signs: Temp: 100.3 F (37.9 C) (03/16 0400) Temp Source: Oral (03/16 0400) BP: 111/79 (03/16 0500) Pulse Rate: 114 (03/16 0756)  Labs: Recent Labs    05/29/19 0518 05/29/19 0518 05/29/19 1351 05/30/19 0621 05/31/19 0612  HGB 8.9*   < >  --  8.6* 11.6*  HCT 29.2*  --   --  28.5* 35.1*  PLT 285  --   --  246 299  APTT 67*  --  40*  --   --   LABPROT 32.3*   < > 27.1* 27.8* 21.6*  INR 3.1*   < > 2.5* 2.6* 1.9*  CREATININE 6.39*  --   --  6.74*  --    < > = values in this interval not displayed.    Estimated Creatinine Clearance: 19.6 mL/min (A) (by C-G formula based on SCr of 6.74 mg/dL (H)).  Assessment: 34 yr old male with history of ESRD on HD presented with left MCA infarct - received TPA and revascularization with IR on 2/5. On 2/8 found to have small LV apical thrombus on ECHO. Pharmacy consulted to dose IV heparin, which was switched to bivalirudin 2/15 for HIT. 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 SRA very positive at 35. Heparin allergy has appropriately been added to chart and updated with SRA results.   Transitioned off bivalrudin on 3/14. Now on warfarin alone. INR subtherapeutic at 1.9 today. Monitoring with noted drug interaction with amiodarone. Hgb up to 11.6 this AM-anemia of ESRD received Aranesp yesterday with HD. No bleeding reported.   Goal of Therapy:  INR 2-3 Monitor platelets by anticoagulation protocol: Yes   Plan:  - Warfarin 5mg  po x1 tonight.  - Monitor daily INR, DDI with amiodarone, s/sx of bleeding   [name],  PharmD PGY1 Pharmacy Resident

## 2019-05-31 NOTE — Progress Notes (Signed)
Bilateral upper extremity venous duplex complete.  Please see CV Proc tab for preliminary results. [place], RVT 5:15 PM  05/31/2019

## 2019-05-31 NOTE — Progress Notes (Signed)
SLP Cancellation Note  Patient Details Name: Carlos Brewer. MRN: [id number] DOB: February 08, 1986   Cancelled treatment:       Reason Eval/Treat Not Completed: Medical issues which prohibited therapy; pt continues to work on weaning. Will continue to follow.    Osie Bond., M.A. [place] Acute Rehabilitation Services Pager [phone] Office [phone]  05/31/2019, 8:08 AM

## 2019-05-31 NOTE — Progress Notes (Signed)
Occupational Therapy Treatment Patient Details Name: Milano Rosevear. MRN: [id number] DOB: August 19, 1985 Today's Date: 05/31/2019    History of present illness Pt is a 34 y/o male smoker who initially presented on 2/5 with slurred speech and Rt sided weakness. Admitted with left MCA CVA and multiple smaller embolic infarcts s/p tPA and thrombectomy in IR.  Additionally found to have left ventricular  thrombus. Hospital course complicated by septic shock r/t HCAP, AKI requiring CRRT, polymorphic VT and cardiogenic shock. Echo EF < 20%. Pt with Intermittent pressor dependence, required tracheostomy. Tolerating trach collar 3/4, back on vent 3/5 with flash pulmonary edema.  Started IHD 3/10.   OT comments  Pt demonstrating decreased engagement this session with poor following of cues and requiring Max-Total A for bed mobility. While sitting at EOB, pt with episode of pushing back into extension with trunk, extension of RUE, darting of eyes, and increased secretion at mouth. Pt typically making eye contact with calling of his name, but during episode pt not able to focus on therapist. BP stable. Called RN who contacted MD. Update dc to LTACH, updated goals, and will continue to follow acutely as admitted.    Follow Up Recommendations  LTACH;Supervision/Assistance - 24 hour    Equipment Recommendations  Other (comment)(TBD)    Recommendations for Other Services      Precautions / Restrictions Precautions Precautions: Fall       Mobility Bed Mobility Overal bed mobility: Needs Assistance Bed Mobility: Supine to Sit;Sit to Supine Rolling: Max assist Sidelying to sit: Max assist;+2 for physical assistance   Sit to supine: Total assist;+2 for physical assistance   General bed mobility comments: significant assist to get pt's legs from dangling right to left side of bed.  Up via L elbow with significant 2 person assist.  Pt need scooting with total assist to the  EOB  Transfers Overall transfer level: Needs assistance   Transfers: Sit to/from Stand Sit to Stand: Total assist;+2 safety/equipment         General transfer comment: pt unable to focus on task enough to significantly clear his buttock off the bed to adjust padding.    Balance Overall balance assessment: Needs assistance Sitting-balance support: Feet supported;Single extremity supported Sitting balance-Leahy Scale: Zero Sitting balance - Comments: pt unable to make balance corrections to hold or "bob" around midline.  Withing 2-3 minutes working at EOB on balance, pt began to extend, posture with left arm, splay his legs out and begin to move eyes in a non coordinated manner without ability to focus on target. Postural control: Posterior lean                                 ADL either performed or assessed with clinical judgement   ADL Overall ADL's : Needs assistance/impaired     Grooming: Wash/dry hands;Bed level Grooming Details (indicate cue type and reason): Total hand over hang for hand hygiene                               General ADL Comments: Total A with poor following of commands and engagement this session     Vision   Additional Comments: Noting nystagmus when returning to bed.   Perception     Praxis      Cognition Arousal/Alertness: Awake/alert Behavior During Therapy: Flat affect Overall Cognitive Status: Impaired/Different from baseline  Area of Impairment: Attention;Following commands;Safety/judgement;Problem solving                   Current Attention Level: Focused   Following Commands: Follows one step commands inconsistently Safety/Judgement: Decreased awareness of safety;Decreased awareness of deficits   Problem Solving: Slow processing General Comments: Continues to be restless. Poor following of commands. Makes eye contact to calling of his name        Exercises     Shoulder Instructions        General Comments Pt with episode of pushing into extension, RUE extension, eye darting into different directions without focusing to person's face when name is called, and increased sedation    Pertinent Vitals/ Pain       Pain Assessment: Faces Faces Pain Scale: Hurts little more Pain Location: generalized Pain Descriptors / Indicators: Discomfort(squirming) Pain Intervention(s): Monitored during session;Limited activity within patient's tolerance;Repositioned  Home Living                                          Prior Functioning/Environment              Frequency  Min 2X/week        Progress Toward Goals  OT Goals(current goals can now be found in the care plan section)  Progress towards OT goals: Not progressing toward goals - comment  Acute Rehab OT Goals Patient Stated Goal: to d/c home with mother after rehab OT Goal Formulation: With patient Time For Goal Achievement: 06/01/19 Potential to Achieve Goals: Good ADL Goals Pt Will Perform Grooming: with modified independence;sitting Pt Will Perform Upper Body Dressing: sitting;with supervision Pt/caregiver will Perform Home Exercise Program: Increased strength;Increased ROM;Both right and left upper extremity;With Supervision;With written HEP provided Additional ADL Goal #1: Pt will maintain sitting balance EOB >10 min at supervision level in prep for ADL. Additional ADL Goal #2: Pt will follow multi step commands with 75% accuracy during functional task completion. Additional ADL Goal #3: Pt will perform bed mobility with minguard assist as precursor to EOB/OOB ADL.  Plan Discharge plan needs to be updated    Co-evaluation    PT/OT/SLP Co-Evaluation/Treatment: Yes Reason for Co-Treatment: For patient/therapist safety;To address functional/ADL transfers PT goals addressed during session: Mobility/safety with mobility OT goals addressed during session: ADL's and self-care      AM-PAC OT "6  Clicks" Daily Activity     Outcome Measure   Help from another person eating meals?: Total Help from another person taking care of personal grooming?: Total Help from another person toileting, which includes using toliet, bedpan, or urinal?: Total Help from another person bathing (including washing, rinsing, drying)?: Total Help from another person to put on and taking off regular upper body clothing?: Total Help from another person to put on and taking off regular lower body clothing?: Total 6 Click Score: 6    End of Session Equipment Utilized During Treatment: Oxygen  OT Visit Diagnosis: Muscle weakness (generalized) (M62.81);Other abnormalities of gait and mobility (R26.89)   Activity Tolerance Patient limited by fatigue   Patient Left in bed;with call bell/phone within reach;with nursing/sitter in room;with family/visitor present   Nurse Communication Mobility status        Time: 1501-1530 OT Time Calculation (min): 29 min  Charges: OT General Charges $OT Visit: 1 Visit OT Treatments $Self Care/Home Management : 8-22 mins  Donell Sliwinski MSOT, OTR/L Acute  Rehab Pager: [phone] Office: [place] 05/31/2019, 6:33 PM

## 2019-06-01 ENCOUNTER — Inpatient Hospital Stay (HOSPITAL_COMMUNITY): Payer: No Typology Code available for payment source

## 2019-06-01 DIAGNOSIS — A419 Sepsis, unspecified organism: Secondary | ICD-10-CM | POA: Diagnosis not present

## 2019-06-01 DIAGNOSIS — I639 Cerebral infarction, unspecified: Secondary | ICD-10-CM | POA: Diagnosis not present

## 2019-06-01 DIAGNOSIS — R6521 Severe sepsis with septic shock: Secondary | ICD-10-CM | POA: Diagnosis not present

## 2019-06-01 DIAGNOSIS — N179 Acute kidney failure, unspecified: Secondary | ICD-10-CM | POA: Diagnosis not present

## 2019-06-01 LAB — GLUCOSE, CAPILLARY
Glucose-Capillary: 140 mg/dL — ABNORMAL HIGH (ref 70–99)
Glucose-Capillary: 154 mg/dL — ABNORMAL HIGH (ref 70–99)
Glucose-Capillary: 165 mg/dL — ABNORMAL HIGH (ref 70–99)
Glucose-Capillary: 173 mg/dL — ABNORMAL HIGH (ref 70–99)
Glucose-Capillary: 278 mg/dL — ABNORMAL HIGH (ref 70–99)
Glucose-Capillary: 346 mg/dL — ABNORMAL HIGH (ref 70–99)
Glucose-Capillary: 390 mg/dL — ABNORMAL HIGH (ref 70–99)

## 2019-06-01 LAB — LACTIC ACID, PLASMA: Lactic Acid, Venous: 1.7 mmol/L (ref 0.5–1.9)

## 2019-06-01 LAB — RENAL FUNCTION PANEL
Albumin: 3.2 g/dL — ABNORMAL LOW (ref 3.5–5.0)
Anion gap: 29 — ABNORMAL HIGH (ref 5–15)
BUN: 145 mg/dL — ABNORMAL HIGH (ref 6–20)
CO2: 17 mmol/L — ABNORMAL LOW (ref 22–32)
Calcium: 8.7 mg/dL — ABNORMAL LOW (ref 8.9–10.3)
Chloride: 99 mmol/L (ref 98–111)
Creatinine, Ser: 10.51 mg/dL — ABNORMAL HIGH (ref 0.61–1.24)
GFR calc Af Amer: 7 mL/min — ABNORMAL LOW (ref >60)
GFR calc non Af Amer: 6 mL/min — ABNORMAL LOW (ref >60)
Glucose, Bld: 441 mg/dL — ABNORMAL HIGH (ref 70–99)
Phosphorus: 9.6 mg/dL — ABNORMAL HIGH (ref 2.5–4.6)
Potassium: 4.6 mmol/L (ref 3.5–5.1)
Sodium: 145 mmol/L (ref 135–145)

## 2019-06-01 LAB — CBC
HCT: 32.5 % — ABNORMAL LOW (ref 39.0–52.0)
Hemoglobin: 10.2 g/dL — ABNORMAL LOW (ref 13.0–17.0)
MCH: 31.5 pg (ref 26.0–34.0)
MCHC: 31.4 g/dL (ref 30.0–36.0)
MCV: 100.3 fL — ABNORMAL HIGH (ref 80.0–100.0)
Platelets: 234 10*3/uL (ref 150–400)
RBC: 3.24 MIL/uL — ABNORMAL LOW (ref 4.22–5.81)
RDW: 18.2 % — ABNORMAL HIGH (ref 11.5–15.5)
WBC: 14.4 10*3/uL — ABNORMAL HIGH (ref 4.0–10.5)
nRBC: 1.4 % — ABNORMAL HIGH (ref 0.0–0.2)

## 2019-06-01 LAB — POCT I-STAT 7, (LYTES, BLD GAS, ICA,H+H)
Acid-base deficit: 7 mmol/L — ABNORMAL HIGH (ref 0.0–2.0)
Bicarbonate: 15.4 mmol/L — ABNORMAL LOW (ref 20.0–28.0)
Calcium, Ion: 1 mmol/L — ABNORMAL LOW (ref 1.15–1.40)
HCT: 31 % — ABNORMAL LOW (ref 39.0–52.0)
Hemoglobin: 10.5 g/dL — ABNORMAL LOW (ref 13.0–17.0)
O2 Saturation: 100 %
Patient temperature: 105
Potassium: 5 mmol/L (ref 3.5–5.1)
Sodium: 142 mmol/L (ref 135–145)
TCO2: 16 mmol/L — ABNORMAL LOW (ref 22–32)
pCO2 arterial: 26.9 mmHg — ABNORMAL LOW (ref 32.0–48.0)
pH, Arterial: 7.38 (ref 7.350–7.450)
pO2, Arterial: 203 mmHg — ABNORMAL HIGH (ref 83.0–108.0)

## 2019-06-01 LAB — CULTURE, BLOOD (ROUTINE X 2)
Culture: NO GROWTH
Culture: NO GROWTH
Special Requests: ADEQUATE

## 2019-06-01 LAB — PROTIME-INR
INR: 2.1 — ABNORMAL HIGH (ref 0.8–1.2)
Prothrombin Time: 23.3 seconds — ABNORMAL HIGH (ref 11.4–15.2)

## 2019-06-01 MED ORDER — VANCOMYCIN HCL IN DEXTROSE 1-5 GM/200ML-% IV SOLN: 1000.0000 mg | INTRAVENOUS | Status: DC

## 2019-06-01 MED ORDER — NOREPINEPHRINE 4 MG/250ML-% IV SOLN
INTRAVENOUS | Status: AC
  Administered 2019-06-01: 20 ug/min via INTRAVENOUS
  Filled 2019-06-01: qty 250

## 2019-06-01 MED ORDER — SODIUM CHLORIDE 0.9 % IV SOLN
200.0000 mg | Freq: Once | INTRAVENOUS | Status: AC
  Administered 2019-06-01: 200 mg via INTRAVENOUS
  Filled 2019-06-01: qty 200

## 2019-06-01 MED ORDER — CALCIUM GLUCONATE-NACL 1-0.675 GM/50ML-% IV SOLN
1.0000 g | Freq: Once | INTRAVENOUS | Status: AC
  Administered 2019-06-01: 1000 mg via INTRAVENOUS
  Filled 2019-06-01: qty 50

## 2019-06-01 MED ORDER — SODIUM CHLORIDE 0.9 % IV SOLN
400.0000 mg | Freq: Once | INTRAVENOUS | Status: AC
  Administered 2019-06-01: 400 mg via INTRAVENOUS
  Filled 2019-06-01: qty 4

## 2019-06-01 MED ORDER — VASOPRESSIN 20 UNIT/ML IV SOLN
0.0300 [IU]/min | INTRAVENOUS | Status: DC
  Administered 2019-06-01 – 2019-06-03 (×4): 0.03 [IU]/min via INTRAVENOUS
  Filled 2019-06-01 (×6): qty 2

## 2019-06-01 MED ORDER — SODIUM CHLORIDE 0.9 % IV SOLN: 100.0000 mg | INTRAVENOUS | Status: DC

## 2019-06-01 MED ORDER — ONDANSETRON HCL 4 MG/2ML IJ SOLN
4.0000 mg | Freq: Four times a day (QID) | INTRAMUSCULAR | Status: DC | PRN
  Administered 2019-06-02 – 2019-06-18 (×2): 4 mg via INTRAVENOUS
  Filled 2019-06-01 (×3): qty 2

## 2019-06-01 MED ORDER — VANCOMYCIN HCL 2000 MG/400ML IV SOLN
2000.0000 mg | Freq: Once | INTRAVENOUS | Status: AC
  Administered 2019-06-01: 2000 mg via INTRAVENOUS
  Filled 2019-06-01: qty 400

## 2019-06-01 MED ORDER — SODIUM CHLORIDE 0.9 % IV SOLN
1000.0000 mg | Freq: Every day | INTRAVENOUS | Status: DC
  Administered 2019-06-01: 1000 mg via INTRAVENOUS
  Filled 2019-06-01 (×2): qty 1

## 2019-06-01 MED ORDER — SODIUM CHLORIDE 0.9 % IV SOLN
100.0000 mg | INTRAVENOUS | Status: DC
  Administered 2019-06-02 – 2019-06-03 (×2): 100 mg via INTRAVENOUS
  Filled 2019-06-01 (×3): qty 100

## 2019-06-01 MED ORDER — SODIUM CHLORIDE 0.9 % IV BOLUS
250.0000 mL | Freq: Once | INTRAVENOUS | Status: AC
  Administered 2019-06-01: 250 mL via INTRAVENOUS

## 2019-06-01 MED ORDER — VANCOMYCIN HCL IN DEXTROSE 1-5 GM/200ML-% IV SOLN
1000.0000 mg | Freq: Once | INTRAVENOUS | Status: AC
  Administered 2019-06-01: 1000 mg via INTRAVENOUS
  Filled 2019-06-01: qty 200

## 2019-06-01 MED ORDER — ACETAMINOPHEN 160 MG/5ML PO SOLN
650.0000 mg | ORAL | Status: AC | PRN
  Administered 2019-06-01 – 2019-06-04 (×12): 650 mg
  Filled 2019-06-01 (×12): qty 20.3

## 2019-06-01 MED ORDER — NOREPINEPHRINE 4 MG/250ML-% IV SOLN
0.0000 ug/min | INTRAVENOUS | Status: DC
  Administered 2019-06-01: 2 ug/min via INTRAVENOUS
  Administered 2019-06-01: 16 ug/min via INTRAVENOUS
  Administered 2019-06-01: 13 ug/min via INTRAVENOUS
  Administered 2019-06-01: 15 ug/min via INTRAVENOUS
  Administered 2019-06-01: 14 ug/min via INTRAVENOUS
  Filled 2019-06-01 (×5): qty 250

## 2019-06-01 MED ORDER — WARFARIN SODIUM 5 MG PO TABS
5.0000 mg | ORAL_TABLET | Freq: Once | ORAL | Status: AC
  Administered 2019-06-01: 5 mg via ORAL
  Filled 2019-06-01: qty 1

## 2019-06-01 MED ORDER — ALBUMIN HUMAN 25 % IV SOLN
25.0000 g | Freq: Once | INTRAVENOUS | Status: AC
  Administered 2019-06-01: 25 g via INTRAVENOUS
  Filled 2019-06-01: qty 100

## 2019-06-01 MED ORDER — INSULIN ASPART 100 UNIT/ML ~~LOC~~ SOLN
8.0000 [IU] | SUBCUTANEOUS | Status: DC
  Administered 2019-06-01 – 2019-06-04 (×9): 8 [IU] via SUBCUTANEOUS

## 2019-06-01 MED ORDER — SODIUM CHLORIDE 0.9 % IV SOLN
1.0000 g | Freq: Every day | INTRAVENOUS | Status: DC
  Administered 2019-06-01 – 2019-06-03 (×3): 1 g via INTRAVENOUS
  Filled 2019-06-01 (×4): qty 1

## 2019-06-01 NOTE — Progress Notes (Addendum)
Pharmacy Antibiotic Note  Carlos Dominic. is a 34 y.o. male admitted on 04/25/2019 with stroke.  Pharmacy has been consulted for Vancomycin dosing for fever. Pt with prolonged hospital stay. WBC is elevated. Currently has been on MWF HD sessions. May need extra sessions.   Plan: Vancomycin 2000 mg IV x 1, then 1000 mg IV qHD MWF Cefepime per MD Trend WBC, temp, HD schedule F/U infectious work-up Drug levels as indicated   Height: 6\' 1"  (185.4 cm) Weight: 234 lb 2.1 oz (106.2 kg) IBW/kg (Calculated) : 79.9  Temp (24hrs), Avg:102 F (38.9 C), Min:100.5 F (38.1 C), Max:106.7 F (41.5 C)  Recent Labs  Lab 05/27/19 0543 05/27/19 1653 05/28/19 0349 05/29/19 0518 05/30/19 0621 05/31/19 0612  WBC 14.8*  --  16.6* 16.0* 13.3* 16.7*  CREATININE 6.39* 4.06* 5.43* 6.39* 6.74*  --     Estimated Creatinine Clearance: 19.6 mL/min (A) (by C-G formula based on SCr of 6.74 mg/dL (H)).    Allergies  Allergen Reactions  . Heparin Other (See Comments)    Heparin induced thrombocytopenia. 2/15 HIT OD 1.692. 2/16 SRA positive-90.     Narda Bonds, PharmD, BCPS Clinical Pharmacist Phone: [phone]

## 2019-06-01 NOTE — Procedures (Signed)
Central Venous Catheter Insertion Procedure Note Carlos Brewer [id number] 10/10/1985  Procedure: Insertion of Central Venous Catheter Indications: Drug and/or fluid administration  Procedure Details Consent: Unable to obtain consent because of emergent medical necessity. Time Out: Verified patient identification, verified procedure, site/side was marked, verified correct patient position, special equipment/implants available, medications/allergies/relevent history reviewed, required imaging and test results available.  Performed  Maximum sterile technique was used including antiseptics, cap, gloves, gown, hand hygiene, mask and sheet. Skin prep: Chlorhexidine; local anesthetic administered A antimicrobial bonded/coated triple lumen catheter was placed in the right femoral vein due to patient being a dialysis patient and emergent situation using the Seldinger technique.  Evaluation Blood flow good Complications: No apparent complications Patient did tolerate procedure well.  Carlos Brewer 06/01/2019, 4:36 AM

## 2019-06-01 NOTE — Progress Notes (Signed)
ANTICOAGULATION CONSULT NOTE  Pharmacy Consult:  Bivalirudin >> Coumadin Indication: stroke 2/5, LV thrombus 2/8, HIT 2/15   Allergies  Allergen Reactions  . Heparin Other (See Comments)    Heparin induced thrombocytopenia. 2/15 HIT OD 1.692. 2/16 SRA positive-90.     Patient Measurements: Height: 6\' 1"  (185.4 cm) Weight: 229 lb 11.5 oz (104.2 kg) IBW/kg (Calculated) : 79.9   Vital Signs: Temp: 98.3 F (36.8 C) (03/17 0730) Temp Source: Rectal (03/17 0730) BP: 83/61 (03/17 0915) Pulse Rate: 90 (03/17 0915)  Labs: Recent Labs    05/29/19 1351 05/29/19 1351 05/30/19 0621 05/30/19 9233 05/31/19 0612 05/31/19 0612 06/01/19 0346 06/01/19 0551 06/01/19 0754  HGB  --   --  8.6*   < > 11.6*   < > 10.5* 10.2*  --   HCT  --   --  28.5*   < > 35.1*  --  31.0* 32.5*  --   PLT  --   --  246  --  299  --   --  234  --   APTT 40*  --   --   --   --   --   --   --   --   LABPROT 27.1*   < > 27.8*  --  21.6*  --   --  23.3*  --   INR 2.5*   < > 2.6*  --  1.9*  --   --  2.1*  --   CREATININE  --   --  6.74*  --   --   --   --   --  10.51*   < > = values in this interval not displayed.    Estimated Creatinine Clearance: 12.4 mL/min (A) (by C-G formula based on SCr of 10.51 mg/dL (H)).  Assessment: 34 yr old male with history of ESRD on HD presented with left MCA infarct - received TPA and revascularization with IR on 2/5. On 2/8 found to have small LV apical thrombus on ECHO. Pharmacy consulted to dose IV heparin, which was switched to bivalirudin 2/15 for HIT. 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 SRA very positive at 28. Heparin allergy has appropriately been added to chart and updated with SRA results.   Transitioned off bivalrudin on 3/14. Now on warfarin alone. INR therapeutic at 2.1 today. Monitoring with noted drug interaction with amiodarone. Hgb down to 10.2 this AM-anemia of ESRD received Aranesp 3/15 with HD. No bleeding reported.    Goal of Therapy:  INR 2-3 Monitor platelets by anticoagulation protocol: Yes   Plan:  - Warfarin 5mg  po x1 tonight.  - Monitor daily INR, DDI with amiodarone, s/sx of bleeding   [name], PharmD PGY1 Pharmacy Resident

## 2019-06-01 NOTE — Progress Notes (Signed)
Inpatient Diabetes Program Recommendations  AACE/ADA: New Consensus Statement on Inpatient Glycemic Control   Target Ranges:  Prepandial:   less than 140 mg/dL      Peak postprandial:   less than 180 mg/dL (1-2 hours)      Critically ill patients:  140 - 180 mg/dL   Results for Carlos Brewer, Carlos Brewer (MRN [id number]) as of 06/01/2019 12:37  Ref. Range 05/31/2019 07:55 05/31/2019 11:46 05/31/2019 15:47 05/31/2019 19:52 05/31/2019 23:42 06/01/2019 03:03 06/01/2019 03:55 06/01/2019 08:20 06/01/2019 12:19  Glucose-Capillary Latest Ref Range: 70 - 99 mg/dL 240 (H) 360 (H) 266 (H) 267 (H) 283 (H) 346 (H) 390 (H) 278 (H) 165 (H)   Review of Glycemic Control  Current orders for Inpatient glycemic control: Levemir 40 units BID, Novolog 0-20 units Q4H, Novolog 8 units Q4H for tube feeding coverage  Inpatient Diabetes Program Recommendations:    Insulin-Basal: Please consider increasing Levemir to 45 units BID.  Thanks, Barnie Alderman, RN, MSN, CDE Diabetes Coordinator Inpatient Diabetes Program [phone] (Team Pager from 8am to 5pm)

## 2019-06-01 NOTE — Progress Notes (Signed)
[facility] Progress Note    Assessment/ Plan:   AKIpresumably due to ATN with ischemic/nephrotoxic insults worsened by cardiogenic shock. Started on CRRT 04/23/19-  3/7.   Tolerated IHD on 3/10 and 3/12 - removed 3 liters.  will keep on MWF schedule- s/p 3/15.  Now that he has tolerated IHD- the issue is with trach will need LTACH.  Have not done perm access due to poor prognosis.  HD today, bumped from yesterday.  May need to go back on CRRT given hyperpyrexia  Hyperpyrexia: blood cultures- vanc/ cefepime/ eraxis started  1. Cardiogenic shock- Hx milrinone due to development of a fib with rvr and wide complex tachycardia. -Known severe NICM with EF <20% andmay not tolerate  IHD but seems to be so far   3. Acute stroke- presumed embolic with bilateral infarcts and LV thrombus- received tpa on admission 4. SVT/polymorphic VT- per Cardiology 5. Acute hypoxic respiratory failure- s/p trach  6. 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 HIT Ab+, s/p bivalirudin and now on warfarin alone 7. HTN/vol-  BP low-  Does not appear to have significant volume on board,   By weights seems to be on the lower side- no pul edema by x-ray 3/11 - on max dose midodrine  8. Hypernatremia: mild, have added a little free water in the tube but not much 9. Anemia-   iron stores low, got feraheme on 3/9 and 3/12,  - also  giving ESA - darbe 100 per week 10. Bones-  Phos had been ok -  No binder- rising now, will watch-  For HD tomorrow should help-    intact PTH 70 11. Disposition- overall prognosis is poor with ongoing RRT , trach, as well as cardiogenic shock/cardiomyopathy.  LTACH seems the most likely option  Subjective:    Hyperpyrexia yesterday, fever 107, possible new infarct on head CT.  Back on pressors now.  Bumped from IHD schedule to today    Objective:   BP 112/67   Pulse 84   Temp 99.3 F (37.4 C)   Resp (!) 22   Ht _0  (1.854 m)   Wt 104.2 kg   SpO2 100%   BMI  30.31 kg/m   Intake/Output Summary (Last 24 hours) at 06/01/2019 1604 Last data filed at 06/01/2019 1315 Gross per 24 hour  Intake 2989.99 ml  Output 994 ml  Net 1995.99 ml   Weight change:   Physical Exam: Gen: awake in bed, vented, nonverbal. Mom at bedside HEENT - trach on vent CVS: S1S2 no rub Resp:reduced breath sounds Abd: soft/ND Ext:  no edema Access: rightIJtunneled catheter  Imaging: CT HEAD WO CONTRAST  Result Date: 06/01/2019 CLINICAL DATA:  Cerebral hemorrhage suspected EXAM: CT HEAD WITHOUT CONTRAST TECHNIQUE: Contiguous axial images were obtained from the base of the skull through the vertex without intravenous contrast. COMPARISON:  MRI MRA 04/28/2019, CT 04/23/2019 FINDINGS: Brain: New area of hypoattenuation in the left pons. Increasing evolving area of encephalomalacia in the left frontal lobe. Possible expansion expansion of a region of encephalomalacia involving the right frontal lobe and insula when compared to prior. More punctate region of gliotic change at the frontoparietal vertex on the right. Seen on comparison MRI. Further areas of remote infarct within the right cerebellum. No evidence of acute intracranial hemorrhage. No mass effect or midline shift. Basal cisterns are patent. Midline intracranial structures are unremarkable. Cerebellar tonsils are normally positioned. Vascular: No hyperdense vessel or unexpected calcification. Skull: No calvarial fracture or suspicious  osseous lesion. No scalp swelling or hematoma. Sinuses/Orbits: Paranasal sinuses and mastoid air cells are predominantly clear. Slightly dysconjugate gaze. Included orbital structures are otherwise unremarkable. Other: None IMPRESSION: New area of hypoattenuation in the left pons concerning for subacute to early chronic infarct not seen on comparison imaging. Interval expansion of a region of gliosis and encephalomalacia in the right frontal lobe beyond the territory seen on comparison MR and  MRA. Recommend further evaluation with MRI for better assessment. Likely evolving region of encephalomalacia in the left frontal lobe corresponding to prior infarct on MRI. With additional areas corresponding to remote infarct in the right frontoparietal vertex and right cerebellar hemisphere. No evidence of acute intracranial hemorrhage. Electronically Signed   By: Lovena Le M.D.   On: 06/01/2019 04:37   DG CHEST PORT 1 VIEW  Result Date: 06/01/2019 CLINICAL DATA:  Hypoxia. EXAM: PORTABLE CHEST 1 VIEW COMPARISON:  May 26, 2019. FINDINGS: Stable cardiomegaly. Tracheostomy tube is in grossly good position. Feeding tube is seen entering stomach. Right internal jugular dialysis catheter is unchanged in position. No pneumothorax or pleural effusion is noted. No acute pulmonary disease is noted. Bony thorax is unremarkable. IMPRESSION: No acute cardiopulmonary abnormality seen. Electronically Signed   By: Marijo Conception M.D.   On: 06/01/2019 11:11   VAS [place] LOWER EXTREMITY VENOUS (DVT)  Result Date: 05/31/2019  Lower Venous DVTStudy Indications: Fever, prolonged bed confinement.  Risk Factors: Immobility. Limitations: Body habitus, poor ultrasound/tissue interface and uncooperative. Comparison Study: No prior exam. Performing Technologist: Baldwin Crown ARDMS, RVT  Examination Guidelines: A complete evaluation includes B-mode imaging, spectral Doppler, color Doppler, and power Doppler as needed of all accessible portions of each vessel. Bilateral testing is considered an integral part of a complete examination. Limited examinations for reoccurring indications may be performed as noted. The reflux portion of the exam is performed with the patient in reverse Trendelenburg.  +---------+---------------+---------+-----------+----------+-----------------+ RIGHT    CompressibilityPhasicitySpontaneityPropertiesThrombus Aging    +---------+---------------+---------+-----------+----------+-----------------+  CFV      Full           Yes      Yes                                    +---------+---------------+---------+-----------+----------+-----------------+ SFJ      Full                                                           +---------+---------------+---------+-----------+----------+-----------------+ FV Prox  Full                                                           +---------+---------------+---------+-----------+----------+-----------------+ FV Mid   Full                                                           +---------+---------------+---------+-----------+----------+-----------------+ FV DistalFull                                                           +---------+---------------+---------+-----------+----------+-----------------+  PFV      Full                                                           +---------+---------------+---------+-----------+----------+-----------------+ POP      Full           Yes      Yes                                    +---------+---------------+---------+-----------+----------+-----------------+ PTV      Full                                         poorly visualized +---------+---------------+---------+-----------+----------+-----------------+ PERO     Full                                         poorly visualized +---------+---------------+---------+-----------+----------+-----------------+   +---------+---------------+---------+-----------+----------+-----------------+ LEFT     CompressibilityPhasicitySpontaneityPropertiesThrombus Aging    +---------+---------------+---------+-----------+----------+-----------------+ CFV      Full           Yes      Yes                                    +---------+---------------+---------+-----------+----------+-----------------+ SFJ      Full                                                            +---------+---------------+---------+-----------+----------+-----------------+ FV Prox  Full                                                           +---------+---------------+---------+-----------+----------+-----------------+ FV Mid   Full                                                           +---------+---------------+---------+-----------+----------+-----------------+ FV DistalFull                                                           +---------+---------------+---------+-----------+----------+-----------------+ PFV      Full                                                           +---------+---------------+---------+-----------+----------+-----------------+  POP      Full           Yes      Yes                                    +---------+---------------+---------+-----------+----------+-----------------+ PTV      Full                                         poorly visualized +---------+---------------+---------+-----------+----------+-----------------+ PERO     Full                                         poorly visualized +---------+---------------+---------+-----------+----------+-----------------+     Summary: RIGHT: - There is no evidence of deep vein thrombosis in the lower extremity. However, portions of this examination were limited- see technologist comments above.  - No cystic structure found in the popliteal fossa.  LEFT: - There is no evidence of deep vein thrombosis in the lower extremity. However, portions of this examination were limited- see technologist comments above.  - No cystic structure found in the popliteal fossa.  *See table(s) above for measurements and observations. Electronically signed by Harold Barban MD on 05/31/2019 at 9:41:23 PM.    Final    VAS [place] UPPER EXTREMITY VENOUS DUPLEX  Result Date: 05/31/2019 UPPER VENOUS STUDY  Indications: fever Limitations: Body habitus, bandages and patient cooperation. Performing  Technologist: Antonieta Pert RDMS, RVT  Examination Guidelines: A complete evaluation includes B-mode imaging, spectral Doppler, color Doppler, and power Doppler as needed of all accessible portions of each vessel. Bilateral testing is considered an integral part of a complete examination. Limited examinations for reoccurring indications may be performed as noted.  Right Findings: +----------+------------+---------+-----------+----------+--------------+ RIGHT     CompressiblePhasicitySpontaneousProperties   Summary     +----------+------------+---------+-----------+----------+--------------+ IJV                                                 Not visualized +----------+------------+---------+-----------+----------+--------------+ Subclavian    Full       Yes       Yes                             +----------+------------+---------+-----------+----------+--------------+ Axillary      Full       Yes       Yes                             +----------+------------+---------+-----------+----------+--------------+ Brachial      Full                                                 +----------+------------+---------+-----------+----------+--------------+ Radial        Full                                                 +----------+------------+---------+-----------+----------+--------------+  Ulnar                                               Not visualized +----------+------------+---------+-----------+----------+--------------+ Cephalic    Partial                                                +----------+------------+---------+-----------+----------+--------------+ Basilic                                             Not visualized +----------+------------+---------+-----------+----------+--------------+  Left Findings: +----------+------------+---------+-----------+----------+--------------+ LEFT      CompressiblePhasicitySpontaneousProperties   Summary      +----------+------------+---------+-----------+----------+--------------+ IJV                                                 Not visualized +----------+------------+---------+-----------+----------+--------------+ Subclavian    Full       Yes       Yes                             +----------+------------+---------+-----------+----------+--------------+ Axillary      Full       Yes       Yes                             +----------+------------+---------+-----------+----------+--------------+ Brachial      Full                                    duplicated   +----------+------------+---------+-----------+----------+--------------+ Radial        Full                                                 +----------+------------+---------+-----------+----------+--------------+ Ulnar                                               Not visualized +----------+------------+---------+-----------+----------+--------------+ Cephalic                                            Not visualized +----------+------------+---------+-----------+----------+--------------+ Basilic                                             Not visualized +----------+------------+---------+-----------+----------+--------------+  Summary:  Right: No evidence of deep vein thrombosis in the upper extremity. IJV not visualized due to trach colar, basilic and ulnar not visualized due to patient position  and cooperation.  Left: No evidence of deep vein thrombosis in the upper extremity. IJV not visualized due to trach colar. Basilic and ulnar not visualized due to patient position and cooperation. Cephalic not clearly visualized. Small focal area of thrombus near punctore site. Unable to determine if it is cephalic or accessory vein.  *See table(s) above for measurements and observations.  Diagnosing physician: Harold Barban MD Electronically signed by Harold Barban MD on 05/31/2019 at 9:41:36 PM.    Final      Labs: BMET Recent Labs  Lab 05/26/19 1535 05/26/19 1535 05/27/19 0543 05/27/19 1653 05/28/19 6063 05/29/19 0518 05/30/19 0160 05/31/19 0612 06/01/19 0346 06/01/19 0754  NA 145   < > 145 141 141 145 149*  --  142 145  K 4.4   < > 4.2 4.4 4.3 4.0 3.7  --  5.0 4.6  CL 102  --  100 97* 98 100 101  --   --  99  CO2 26  --  24 19* _0 --   --  17*  GLUCOSE 216*  --  267* 228* 303* 238* 244*  --   --  441*  BUN 79*  --  98* 47* 75* 115* 132*  --   --  145*  CREATININE 5.90*  --  6.39* 4.06* 5.43* 6.39* 6.74*  --   --  10.51*  CALCIUM 9.3  --  8.9 9.4 8.5* 8.7* 8.9  --   --  8.7*  PHOS 4.1   < > 3.8  3.9 4.8* 4.5 6.5*  6.7* 6.3*  6.3* 5.9*  --  9.6*   < > = values in this interval not displayed.   CBC Recent Labs  Lab 05/29/19 0518 05/29/19 0518 05/30/19 0621 05/31/19 0612 06/01/19 0346 06/01/19 0551  WBC 16.0*  --  13.3* 16.7*  --  14.4*  HGB 8.9*   < > 8.6* 11.6* 10.5* 10.2*  HCT 29.2*   < > 28.5* 35.1* 31.0* 32.5*  MCV 100.3*  --  102.5* 98.0  --  100.3*  PLT 285  --  246 299  --  234   < > = values in this interval not displayed.    Medications:    . amiodarone  200 mg Per Tube BID  . ARIPiprazole  5 mg Per Tube Daily  . aspirin  81 mg Per Tube Daily  . atorvastatin  40 mg Per Tube q1800  . chlorhexidine gluconate (MEDLINE KIT)  15 mL Mouth Rinse BID  . Chlorhexidine Gluconate Cloth  6 each Topical Q0600  . darbepoetin (ARANESP) injection - NON-DIALYSIS  100 mcg Subcutaneous Q Mon-1800  . feeding supplement (PRO-STAT SUGAR FREE 64)  60 mL Per Tube BID  . FLUoxetine  20 mg Per Tube Daily  . free water  100 mL Per Tube Q4H  . insulin aspart  0-20 Units Subcutaneous Q4H  . insulin aspart  8 Units Subcutaneous Q4H  . insulin detemir  40 Units Subcutaneous BID  . mouth rinse  15 mL Mouth Rinse 10 times per day  . midodrine  15 mg Per Tube TID WC  . pantoprazole sodium  40 mg Per Tube Q1200  . sodium chloride flush  10-40 mL Intracatheter Q12H  .  warfarin  5 mg Oral ONCE-1800  . Warfarin - Pharmacist Dosing Inpatient   Does not apply q1800      Madelon Lips  06/01/2019, 4:04 PM

## 2019-06-01 NOTE — Procedures (Signed)
Arterial Catheter Insertion Procedure Note Carlos Brewer. [id number] 1986/01/14  Procedure: Insertion of Arterial Catheter  Indications: Blood pressure monitoring  Procedure Details Consent: Unable to obtain consent because of emergent medical necessity. Time Out: Verified patient identification, verified procedure, site/side was marked, verified correct patient position, special equipment/implants available, medications/allergies/relevent history reviewed, required imaging and test results available.  Performed  Maximum sterile technique was used including antiseptics, cap, gloves, gown, hand hygiene, mask and sheet. Skin prep: Chlorhexidine; local anesthetic administered 20 gauge catheter was inserted into right femoral artery using the Seldinger technique. ULTRASOUND GUIDANCE USED: YES Evaluation Blood flow good; BP tracing good. Complications: No apparent complications.   Collier Bullock 06/01/2019

## 2019-06-01 NOTE — Plan of Care (Signed)
Pt had high grade fever yesterday, with Tmax 107. Currently on cooling blanket and Temp under control. Had CT head repeat showed new area of left pontine hypoattenuation, concerning for subacute or early chronic infarct. Therefore, there is a concern for central fever. By reviewing his images, his MRI on 04/28/19 also showed left mid pontine and right lower pontine infarcts, I feel that could be the hypoattenuation seen on CT this time. If so, the subacute or early chronic pontine infarct should not related to current high grade fever. However, new brainstem or hypothalamus infarcts may not easily seen from CT head. MRI brain would be better to assess. Currently pt on pressors, not stable for MRI. Would recommend continue current supportive care, once stable, may consider MRI brain for further evaluation. Case discussed with Dr. Elsworth Soho.   Rosalin Hawking, MD PhD Stroke Neurology 06/01/2019 11:11 AM

## 2019-06-01 NOTE — Progress Notes (Signed)
eLink Physician-Brief Progress Note Patient Name: Carlos Brewer. DOB: Jul 18, 1985 MRN: [id number]   Date of Service  06/01/2019  HPI/Events of Note  Pt with hyperpyrexia , temps up to 106 despite Tylenol, cooling blanket  And ice packs, also wild swings in blood pressure, concern is for sepsis vs intracranial hemorrhage  with increased intracranial pressure.  eICU Interventions  Normal saline 250 ml iv x 1, Albumin 25 % 25 gm iv x 1, Norepinephrine infusion, Vasopressin infusion, blood cultures x 2, empiric Vancomycin + Cefepime, arterial line, central line, check lactic acid level, non-contrast head CT to r/o ICH, PCCM rounder asked to see patient to place lines.        Kerry Kass Kaidan Harpster 06/01/2019, 3:34 AM

## 2019-06-01 NOTE — Progress Notes (Signed)
NAME:  Carlos Brewer., MRN:  [id number], DOB:  1985-08-30, LOS: 61 ADMISSION DATE:  04/21/2019, CONSULTATION DATE:  04/28/2019 REFERRING MD:  Dr. Leonel Ramsay, CHIEF COMPLAINT:  Slurred speech  Brief History   34 yo male smoker found to have slurred speech and Rt sided weakness.  Admitted with left MCA CVA and multiple smaller embolic infarcts s/p tPA and thrombectomy in IR.  Additionally found to have left ventricular  thrombus.    Course complicated by septic shock r/t HCAP, AKI requiring CRRT, polymorphic VT and cardiogenic shock.  Intermittent pressor dependence, required tracheostomy  Past Medical History  Systolic CHF with non ischemic CM, Cocaine abuse, OSA, DM Hx of CHF (EF 15%)  Hx myocarditis Smoker 1/2 ppd   Significant Hospital Events   2/05 Admit, tPA, IR thrombectomy 2/6 100% on fvent at 2300  Overnight requiring increasing levophed and phenylephrine.    2/6: switched to levophed, epi, started antibiotics, started on CRRT.  2/9  developed wide-complex tachycardia and hypotension, started on amiodarone drip, increase Levophed drip 2/15 drop in platelets, heparin stopped, bival started 2/17 Hypotensive and back on Levophed 2/18 trach, lines changed 2/19 started milrinone , fever 103 2/20 Episode of wide-complex tachycardia yesterday, changed from Levophed to vasopressin 2/22 stopping vanc. Still on inotrope support. Some AF w/ RVR. Had to be placed back on pressors. ivabradine added 2/23 still on pressors/ CRRT continued 2/24 tmax 98.4, Remains on CRRT, even UF, remains anuric, Levophed at 14 mcg/min, Milrinone at 0.125 mcg/kg/min, Coox 63.4 Doing well this morning on ATC, no events overnight.  Midodrine added   2/25 more interactive, remains on ATC, tmax 99.5/ WBC 21.4, ~900 ml liquid stool/ 24 hours, neg Cdiff, Levophed up to 20 mcg/min, CVP 2, ,milrinone remains at 0.125 mcg/kg/min, coox 92.7, Off CRRT since last night ~2000 s/p clotted x 3 off citrate, restarted    3/1 Amio drip restarted for WCT/atrial flutter, milrinone turned off 3/2 CRRT stopped, permacath placed 3/5 crrt resumed 3/6 started on pressors for crrt tolerance 3/8 No longer on CRRT or pressor support  3/12 Tolerated PS for >2 hours   Consults:  Neuro IR Cardiology  Nephrology Heart Failure  EP   Procedures:  2/5-2/6:  TPA given at 428 am (total of 90 Mg) 520 am went to IR  S/P Lt common carotid arteriogram followed by complete revascularization of occluded LT MCA sup division mid M2 seg with x 1 pass with 69mx 40 mm solitaire X ret river device and penumbra aspiration with TICI 3 revascularization  ETT 2/05 >> 2/18 LIJ CVL 2/5 >>2/18  RT [place] CVL 2/18 >>3/2 RIJ HD cath 2/6 >>2/18 6 shiley cuffed trach 2/18  >>3/4  RT fem HD 2/19 >>3/2 RIJ permacath 3/2>> 3/4 shiley 4 uncuffed.... dislodged placed back 6cuffed shiley that evening 3/5: change to #6 cuffed distal XLT  3/17 right femoral CVL >> 3/17 right femoral arterial line >>  Significant Diagnostic Tests:  CT angio head/neck 2/05 >> occlusion of Lt MCA bifurcation Echo 2/05 >> EF less than 20%, cannot rule out apical thrombus MRI brain 2/11 > extensive acute infarction of multiple areas without large or medium vessel occlusion Echocardiogram 2/8 Left ventricular ejection fraction, by estimation, is <20%. The left  ventricle has severely decreased function. The left ventricle demonstrates  global hypokinesis. The left ventricular internal cavity size was severely  dilated. Possible small 0.8 x 0.6 cm apical thrombus.   3/16 duplex upper and lower extremities >>neg  3/17 head CT >.  New area of hypoattenuation in the left pons , left frontal encephalomalacia related to old infarct  Micro Data:  SARS CoV2 PCR 2/05 >> negative Influenza PCR 2/05 >> negative Urine 2/6 >> ng resp 2/6 >> nml flora BC 2/6 >>ng resp 2/16 >> ng [facility] 2/19 >> negative  BC x 2 2/25 >>ng Cdiff 2/25 >> neg Trach asp 3/11>> Few candida  albicans BC 3/11>> 1 of 4 with CoNS BC 3/12 >>ng   Antimicrobials:  mero 2/6 -2/12 Zosyn 2/6 , 2/19 >> 2/23 vanc 2/6 >> 2/9 , 2/19 >>2/22 Ceftriaxone 3/12> 3/16 3/17 cefepime >> 3/17 vanc >> 3/17 Anidulafungin >>  Interim history/subjective:   Critically ill, febrile 106 overnight, placed on cooling blanket.  Required Levophed and placement of new lines   Objective   Blood pressure (!) 81/51, pulse 89, temperature 98.3 F (36.8 C), temperature source Rectal, resp. rate (!) 26, height 6' 1"  (1.854 m), weight 104.2 kg, SpO2 98 %.    Vent Mode: PCV FiO2 (%):  [40 %] 40 % Set Rate:  [20 bmp] 20 bmp PEEP:  [5 cmH20] 5 cmH20 Plateau Pressure:  [19 cmH20-28 cmH20] 27 cmH20   Intake/Output Summary (Last 24 hours) at 06/01/2019 1022 Last data filed at 06/01/2019 0900 Gross per 24 hour  Intake 2657.47 ml  Output 325 ml  Net 2332.47 ml   Filed Weights   05/27/19 1137 05/30/19 0708 06/01/19 0730  Weight: 104.8 kg 106.2 kg 104.2 kg   Physical Exam: General: Well-appearing, no acute distress , has lost weight, examined on HD HENT: [place], AT, OP clear, MMM Neck: Cuffed #6 XLT in place, c/d/i Eyes: EOMI, no scleral icterus Respiratory: No accessory muscle use, bilateral ventilated breath sounds, decrease secretions no crackles, wheezing or rales Cardiovascular: RRR, -M/R/G, no JVD GI: BS+, soft, nontender Extremities:-Edema,-tenderness Neuro: Awake, alert, does not follow commands, moves extremities x 4, purposeful GU: Condom cath in place   Labs showed rising BUN, slight decrease in leukocytosis, stable anemia and uncontrolled hyperglycemia  Resolved Hospital Problem list   HCAP 3/33, Acute metabolic encephalopathy 2nd to hypoxia and renal failure, Cardiogenic shock  Assessment & Plan:   Septic shock, new 3/17 Hyperthermia 3/17 Fever, leukocytosis secondary suspected aspiration/CAP with LLL infiltrate Initially improving but now worsening. Suspect that he is having episodes  of aspiration to cause fevers and leukocytosis.  Plan Back on pressors Antibiotics broadened to cefepime, vancomycin, will add anidulafungin while awaiting blood cultures Obtain respiratory cultures and chest x-ray If cultures negative, can attribute hypothermia to pontine infarct   Acute toxic metabolic encephalopathy/Uremic encephalopathy Hyperactive delirium  Plan Continue aripiprazole 59m (against seroquel due to drug interactions).  Can increase if needed on 3/19 due to slow onset Prozac 20 mg daily  Acute hypoxic, hypercapnic respiratory failure with compromised airway in setting of CVA. Possible recurrent aspiration Failure to wean from vent s/p tracheostomy.  Tolerating PS 15/5 x 3 days prior to worsening 3/16 Plan Cuffed #6 XLT trach in place, following with speech therapy Continue routine trach care PRN suctioning  Spontaneous breathing trials as able  Chronic systolic CHF, non-ischemic Polymorphic VT. Apical thrombus in left ventricle. HLD. Continue amiodarone, aspirin, Lipitor, ivabradine   is he having more embolic strokes?    Lt MCA CVA likely embolic. Left pontine CVA, new noted on 3/17 Hx of cocaine abuse, depression. Plan We will ask neurology to reconsult  AKI from ATN in setting of hypoxia, cardiogenic shock. Off CVVH since 3/7.    Plan Appreciate nephrology  assistance.  HD planned for 2 consecutive days 3/16 and 3/17, tolerating in spite of being on pressors Follow urine output, BMP for azotemia May not need Foley   HIT. Anemia of critical illness. - heparin induced Plt Ab 1.692 from 05/02/19 - SRA positive, 90% from 05/03/19 Plan Weaned off bivalirudin. Continue warfarin.   Follow CBC Conservative transfusion goal hemoglobin 7  DM type II poorly controlled with hyperglycemia. Plan Sign scale insulin per protocol Levemir  Insomnia. Ambien as ordered   Case management working on trach/HD placement options, however he is back on  pressors and requires ICU care  Best practice:  Diet: NPO,  TFs DVT prophylaxis: SCDs/ bivalirudin GI prophylaxis: protonix Mobility: progress when able Code Status: full code Disposition: ICU Family: mother at bedside 3/17  The patient is critically ill with multiple organ systems failure and requires high complexity decision making for assessment and support, frequent evaluation and titration of therapies, application of advanced monitoring technologies and extensive interpretation of multiple databases. Critical Care Time devoted to patient care services described in this note independent of APP/resident  time is 35 minutes.    [name] MD. [place] Flood.  Pulmonary & Critical care  If no response to pager , please call [phone]   06/01/2019

## 2019-06-01 NOTE — Progress Notes (Addendum)
eLink Physician-Brief Progress Note Patient Name: Carlos Brewer. DOB: 05-02-1985 MRN: [id number]   Date of Service  06/01/2019  HPI/Events of Note  Fever along with a sub-optimal response to low dose  Acetaminophen.  eICU Interventions  Cooling blanket ordered, Tylenol changed to 650 mg via tube Q 4 hours prn fever x 12 doses. Blood cultures x 2 ordered.        Kerry Kass Barrett Goldie 06/01/2019, 1:38 AM

## 2019-06-02 ENCOUNTER — Inpatient Hospital Stay (HOSPITAL_COMMUNITY): Payer: No Typology Code available for payment source

## 2019-06-02 DIAGNOSIS — R6521 Severe sepsis with septic shock: Secondary | ICD-10-CM | POA: Diagnosis not present

## 2019-06-02 DIAGNOSIS — A419 Sepsis, unspecified organism: Secondary | ICD-10-CM | POA: Diagnosis not present

## 2019-06-02 DIAGNOSIS — Z930 Tracheostomy status: Secondary | ICD-10-CM | POA: Diagnosis not present

## 2019-06-02 DIAGNOSIS — J9600 Acute respiratory failure, unspecified whether with hypoxia or hypercapnia: Secondary | ICD-10-CM | POA: Diagnosis not present

## 2019-06-02 LAB — PROTIME-INR
INR: 2.3 — ABNORMAL HIGH (ref 0.8–1.2)
Prothrombin Time: 25.4 seconds — ABNORMAL HIGH (ref 11.4–15.2)

## 2019-06-02 LAB — CBC
HCT: 28.8 % — ABNORMAL LOW (ref 39.0–52.0)
Hemoglobin: 9 g/dL — ABNORMAL LOW (ref 13.0–17.0)
MCH: 32.4 pg (ref 26.0–34.0)
MCHC: 31.3 g/dL (ref 30.0–36.0)
MCV: 103.6 fL — ABNORMAL HIGH (ref 80.0–100.0)
Platelets: 225 10*3/uL (ref 150–400)
RBC: 2.78 MIL/uL — ABNORMAL LOW (ref 4.22–5.81)
RDW: 18.9 % — ABNORMAL HIGH (ref 11.5–15.5)
WBC: 22.4 10*3/uL — ABNORMAL HIGH (ref 4.0–10.5)
nRBC: 1 % — ABNORMAL HIGH (ref 0.0–0.2)

## 2019-06-02 LAB — BASIC METABOLIC PANEL
Anion gap: 23 — ABNORMAL HIGH (ref 5–15)
BUN: 70 mg/dL — ABNORMAL HIGH (ref 6–20)
CO2: 19 mmol/L — ABNORMAL LOW (ref 22–32)
Calcium: 9 mg/dL (ref 8.9–10.3)
Chloride: 99 mmol/L (ref 98–111)
Creatinine, Ser: 6.27 mg/dL — ABNORMAL HIGH (ref 0.61–1.24)
GFR calc Af Amer: 12 mL/min — ABNORMAL LOW (ref >60)
GFR calc non Af Amer: 11 mL/min — ABNORMAL LOW (ref >60)
Glucose, Bld: 78 mg/dL (ref 70–99)
Potassium: 3.7 mmol/L (ref 3.5–5.1)
Sodium: 141 mmol/L (ref 135–145)

## 2019-06-02 LAB — GLUCOSE, CAPILLARY
Glucose-Capillary: 93 mg/dL (ref 70–99)
Glucose-Capillary: 74 mg/dL (ref 70–99)
Glucose-Capillary: 79 mg/dL (ref 70–99)
Glucose-Capillary: 91 mg/dL (ref 70–99)
Glucose-Capillary: 132 mg/dL — ABNORMAL HIGH (ref 70–99)
Glucose-Capillary: 92 mg/dL (ref 70–99)
Glucose-Capillary: 113 mg/dL — ABNORMAL HIGH (ref 70–99)

## 2019-06-02 MED ORDER — WARFARIN SODIUM 5 MG PO TABS
5.0000 mg | ORAL_TABLET | Freq: Once | ORAL | Status: AC
  Administered 2019-06-02: 5 mg via ORAL
  Filled 2019-06-02: qty 1

## 2019-06-02 MED ORDER — DEXMEDETOMIDINE HCL IN NACL 400 MCG/100ML IV SOLN
0.0000 ug/kg/h | INTRAVENOUS | Status: DC
  Administered 2019-06-02 – 2019-06-03 (×4): 0.4 ug/kg/h via INTRAVENOUS
  Administered 2019-06-04: 0.8 ug/kg/h via INTRAVENOUS
  Administered 2019-06-04: 0.7 ug/kg/h via INTRAVENOUS
  Administered 2019-06-04: 0.5 ug/kg/h via INTRAVENOUS
  Administered 2019-06-04 – 2019-06-05 (×4): 0.8 ug/kg/h via INTRAVENOUS
  Administered 2019-06-06: 0.4 ug/kg/h via INTRAVENOUS
  Administered 2019-06-06: 0.8 ug/kg/h via INTRAVENOUS
  Administered 2019-06-06: 0.3 ug/kg/h via INTRAVENOUS
  Filled 2019-06-02: qty 100
  Filled 2019-06-02: qty 200
  Filled 2019-06-02 (×12): qty 100

## 2019-06-02 MED ORDER — ACETAMINOPHEN 10 MG/ML IV SOLN
1000.0000 mg | Freq: Four times a day (QID) | INTRAVENOUS | Status: AC | PRN
  Administered 2019-06-02: 1000 mg via INTRAVENOUS
  Filled 2019-06-02: qty 100

## 2019-06-02 MED ORDER — VITAL 1.5 CAL PO LIQD
1000.0000 mL | ORAL | Status: DC
  Administered 2019-06-02 – 2019-06-03 (×2): 1000 mL
  Filled 2019-06-02 (×3): qty 1000

## 2019-06-02 MED ORDER — ALBUMIN HUMAN 5 % IV SOLN
12.5000 g | Freq: Once | INTRAVENOUS | Status: AC
  Administered 2019-06-02: 07:00:00 12.5 g via INTRAVENOUS
  Filled 2019-06-02: qty 500

## 2019-06-02 MED ORDER — NOREPINEPHRINE 16 MG/250ML-% IV SOLN
0.0000 ug/min | INTRAVENOUS | Status: DC
  Administered 2019-06-02: 28 ug/min via INTRAVENOUS
  Administered 2019-06-02: 17 ug/min via INTRAVENOUS
  Administered 2019-06-03: 14 ug/min via INTRAVENOUS
  Filled 2019-06-02 (×3): qty 250

## 2019-06-02 MED ORDER — MIDAZOLAM HCL 2 MG/2ML IJ SOLN
1.0000 mg | INTRAMUSCULAR | Status: DC | PRN
  Administered 2019-06-02 – 2019-06-04 (×3): 1 mg via INTRAVENOUS
  Administered 2019-06-04: 2 mg via INTRAVENOUS
  Administered 2019-06-04: 1 mg via INTRAVENOUS
  Administered 2019-06-08: 2 mg via INTRAVENOUS
  Administered 2019-06-08 – 2019-06-10 (×2): 1 mg via INTRAVENOUS
  Filled 2019-06-02 (×8): qty 2

## 2019-06-02 MED ORDER — ARIPIPRAZOLE 10 MG PO TABS
10.0000 mg | ORAL_TABLET | Freq: Every day | ORAL | Status: DC
  Administered 2019-06-02 – 2019-07-09 (×33): 10 mg
  Filled 2019-06-02 (×38): qty 1

## 2019-06-02 NOTE — Progress Notes (Signed)
eLink Physician-Brief Progress Note Patient Name: Carlos Brewer. DOB: 24-Feb-1986 MRN: [id number]   Date of Service  06/02/2019  HPI/Events of Note  Hypotension, agitation on the ventilator.  eICU Interventions  Precedex infusion ordered, Albumin 5 % 250 ml iv bolus x 1        Jenie Parish U Ja Ohman 06/02/2019, 6:47 AM

## 2019-06-02 NOTE — Progress Notes (Signed)
eLink Physician-Brief Progress Note Patient Name: Carlos Brewer. DOB: 1985-07-01 MRN: [id number]   Date of Service  06/02/2019  HPI/Events of Note  Pt dislodged his naso-gastric Dobbhoff feeding tube during a vomiting spell, he has several key medications via enteral route.  eICU Interventions  RN to place an NG tube for medication admin access pending re-insertion of Dobbhoff feeding tube.        Kerry Kass Ralyn Stlaurent 06/02/2019, 4:05 AM

## 2019-06-02 NOTE — Progress Notes (Signed)
Physical Therapy Treatment Patient Details Name: Carlos Brewer. MRN: [id number] DOB: September 21, 1985 Today's Date: 06/02/2019    History of Present Illness Pt is a 34 y/o male smoker who initially presented on 2/5 with slurred speech and Rt sided weakness. Admitted with left MCA CVA and multiple smaller embolic infarcts s/p tPA and thrombectomy in IR.  Additionally found to have left ventricular  thrombus. Hospital course complicated by septic shock r/t HCAP, AKI requiring CRRT, polymorphic VT and cardiogenic shock. Echo EF < 20%. Pt with Intermittent pressor dependence, required tracheostomy. Tolerating trach collar 3/4, back on vent 3/5 with flash pulmonary edema.  Started IHD 3/10.    PT Comments    Pt alert, flat affect, focusing on targets unlike yesterday's treatment.  Pt not following direction during warm up exercise.  Emphasis on transitions to EOB and sitting balance with reaching tasks and balance challenges.    Follow Up Recommendations  LTACH     Equipment Recommendations  Other (comment)(TBA)    Recommendations for Other Services       Precautions / Restrictions Precautions Precautions: Fall Precaution Comments: trach/vent/flexiseal Restrictions Weight Bearing Restrictions: No    Mobility  Bed Mobility Overal bed mobility: Needs Assistance Bed Mobility: Supine to Sit;Sit to Supine Rolling: Max assist Sidelying to sit: Max assist;+2 for physical assistance Supine to sit: Max assist;+2 for physical assistance Sit to supine: +2 for physical assistance;Max assist Sit to sidelying: Max assist;+2 for safety/equipment;+2 for physical assistance General bed mobility comments: pt's legs biased off the right side, but needed to assist legs over to the left and trunk assisted up and forward with lift pad to draw and truncal assist  Transfers Overall transfer level: Needs assistance Equipment used: (STEDY) Transfers: Sit to/from Stand Sit to Stand: Total assist;+2  safety/equipment Stand pivot transfers: Total assist       General transfer comment: pt fatigued quickly and wanted to return to supine, but due to his restlessness already decided to stay in bed.  Ambulation/Gait             General Gait Details: not able yet   Stairs             Wheelchair Mobility    Modified Rankin (Stroke Patients Only) Modified Rankin (Stroke Patients Only) Pre-Morbid Rankin Score: No symptoms Modified Rankin: Severe disability     Balance Overall balance assessment: Needs assistance Sitting-balance support: Feet supported;Single extremity supported Sitting balance-Leahy Scale: Poor Sitting balance - Comments: pt needing min assist in the least to maintain midline orientation.  Pt would hold to doing a reaching task or just lifting arms, but then extend back into the therapist Postural control: Posterior lean Standing balance support: Bilateral upper extremity supported Standing balance-Leahy Scale: Poor Standing balance comment: needing external support.  Is able to stand fully upright for 20-30 seconds before sitting down, question if this from loss of focus, weakness or fatigue.                            Cognition Arousal/Alertness: Awake/alert Behavior During Therapy: Flat affect Overall Cognitive Status: Impaired/Different from baseline Area of Impairment: Attention;Following commands;Safety/judgement;Problem solving                   Current Attention Level: Focused   Following Commands: Follows one step commands inconsistently;Follows one step commands with increased time Safety/Judgement: Decreased awareness of safety;Decreased awareness of deficits   Problem Solving: Slow processing General Comments:  Noting pt with increased engagement. Following simple commands such as "touch my hand" and "let go of tooth brush" with increased time. Increased eye contact this session. Nodding his head yes/no when sitting at  EOB.       Exercises General Exercises - Upper Extremity Shoulder Flexion: PROM;AAROM;Both;5 reps;Seated(Poor sustaining of attention to complete [facility]) General Exercises - Lower Extremity Long Arc Quad: AROM;Both;5 reps Other Exercises Other Exercises: hip/knee warm up ROM with AA flexion and graded resistance extention. Other Exercises: Supine therex ankle pumps x 10, heel slides AAROM x 10    General Comments General comments (skin integrity, edema, etc.): BP down at 70's-80's/ 50's      Pertinent Vitals/Pain Pain Assessment: Faces Faces Pain Scale: No hurt Pain Location: generalized Pain Descriptors / Indicators: Discomfort(squirming) Pain Intervention(s): Monitored during session    Home Living                      Prior Function            PT Goals (current goals can now be found in the care plan section) Acute Rehab PT Goals Patient Stated Goal: to d/c home with mother after rehab PT Goal Formulation: With patient/family Time For Goal Achievement: 06/01/19 Potential to Achieve Goals: Good Progress towards PT goals: Progressing toward goals    Frequency    Min 3X/week      PT Plan Current plan remains appropriate    Co-evaluation PT/OT/SLP Co-Evaluation/Treatment: Yes Reason for Co-Treatment: For patient/therapist safety PT goals addressed during session: Mobility/safety with mobility OT goals addressed during session: ADL's and self-care;Strengthening/ROM      AM-PAC PT "6 Clicks" Mobility   Outcome Measure  Help needed turning from your back to your side while in a flat bed without using bedrails?: Total Help needed moving from lying on your back to sitting on the side of a flat bed without using bedrails?: Total Help needed moving to and from a bed to a chair (including a wheelchair)?: Total Help needed standing up from a chair using your arms (e.g., wheelchair or bedside chair)?: Total Help needed to walk in hospital room?: Total Help  needed climbing 3-5 steps with a railing? : Total 6 Click Score: 6    End of Session   Activity Tolerance: Patient tolerated treatment well;Patient limited by fatigue Patient left: with call bell/phone within reach;in bed;with family/visitor present;with nursing/sitter in room Nurse Communication: Mobility status PT Visit Diagnosis: Other abnormalities of gait and mobility (R26.89);Muscle weakness (generalized) (M62.81);Other symptoms and signs involving the nervous system (R29.898)     Time: 9476-5465 PT Time Calculation (min) (ACUTE ONLY): 31 min  Charges:  $Neuromuscular Re-education: 8-22 mins                     06/02/2019  Ginger Carne., PT Acute Rehabilitation Services [phone]  (pager) [phone]  (office)   Tessie Fass Janelli Welling 06/02/2019, 5:45 PM

## 2019-06-02 NOTE — Progress Notes (Signed)
Nutrition Follow-up  DOCUMENTATION CODES:   Obesity unspecified  INTERVENTION:   Once cortrak replaced resume:  Vital 1.5 @ 60 ml/hr (1440 ml/day) via Cortrak tube 60 ml Prostat BID  100 ml every 4 hours: 600 ml   Provides: 2560 kcal, 157 grams protein, and 1100 ml free water.    NUTRITION DIAGNOSIS:   Inadequate oral intake related to inability to eat as evidenced by NPO status. Ongoing.   GOAL:   Patient will meet greater than or equal to 90% of their needs Meeting with TF.   MONITOR:   TF tolerance  REASON FOR ASSESSMENT:   Ventilator   ASSESSMENT:   Pt with PMH of CHF, OSA, poorly controlled DM, cocaine abuse admitted with L MCA stroke s/p tPA and IR thrombectomy.   tRenal following, pt now on iHD. Pt continues to tolerate but now not pulling fluid due to hypotension. Per CCM pt will need LTACH for vent weaning when appropriate.   2/6 started on CRRT  2/18 s/p trach placement 2/19 Cortrak placement (replaced 3/12)  3/10 CRRT stopped 3/12 iHD 3/17 febrile; developed septic shock on pressors, lines changed, on abx  3/18 Cortrak vomited out, plan for replacement tomorrow, NG placed for meds  MV: 10.5 L/min Temp (24hrs), Avg:100.5 F (38.1 C), Min:98.6 F (37 C), Max:101.3 F (38.5 C)  Medications reviewed and include:  8 units novolog every 4 hours, 40 units levemir BID  Levophed @ 19 mcg Vasopressin: .03 units  - MAP: 63-78  Labs reviewed: BUN/Cr: 70/6.27 (H) CBGs: 93-74-79 - TF held   UOP: 55 x 24 hrs - down from close to a liter  HD: 126   Diet Order:   Diet Order    None      EDUCATION NEEDS:   No education needs have been identified at this time  Skin:  Skin Assessment: Skin Integrity Issues: Skin Integrity Issues:: Stage II, Other (Comment) Stage II: R buttocks, penis Other: open wound buttocks  Last BM:  950 ml via rectal tube  Height:   Ht Readings from Last 1 Encounters:  05/05/2019 6\' 1"  (1.854 m)    Weight:   Wt  Readings from Last 1 Encounters:  06/01/19 104.2 kg    Ideal Body Weight:  83.6 kg  BMI:  Body mass index is 30.31 kg/m.  Estimated Nutritional Needs:   Kcal:  2500 up to 2800 with fevers  Protein:  150-170 grams  Fluid:  2 L/day  Lockie Pares., RD, LDN, CNSC See AMiON for contact information

## 2019-06-02 NOTE — Progress Notes (Signed)
ANTICOAGULATION CONSULT NOTE  Pharmacy Consult:  Bivalirudin >> Coumadin Indication: stroke 2/5, LV thrombus 2/8, HIT 2/15   Allergies  Allergen Reactions  . Heparin Other (See Comments)    Heparin induced thrombocytopenia. 2/15 HIT OD 1.692. 2/16 SRA positive-90.     Patient Measurements: Height: 6\' 1"  (185.4 cm) Weight: 229 lb 11.5 oz (104.2 kg) IBW/kg (Calculated) : 79.9   Vital Signs: Temp: 100.4 F (38 C) (03/18 0815) Temp Source: Bladder (03/18 0400) BP: 104/67 (03/18 0800) Pulse Rate: 81 (03/18 0856)  Labs: Recent Labs    05/31/19 0612 05/31/19 0612 06/01/19 0346 06/01/19 0346 06/01/19 0551 06/01/19 0754 06/02/19 0444  HGB 11.6*   < > 10.5*   < > 10.2*  --  9.0*  HCT 35.1*   < > 31.0*  --  32.5*  --  28.8*  PLT 299  --   --   --  234  --  225  LABPROT 21.6*  --   --   --  23.3*  --  25.4*  INR 1.9*  --   --   --  2.1*  --  2.3*  CREATININE  --   --   --   --   --  10.51* 6.27*   < > = values in this interval not displayed.    Estimated Creatinine Clearance: 20.8 mL/min (A) (by C-G formula based on SCr of 6.27 mg/dL (H)).  Assessment: 34 yr old male with history of ESRD on HD presented with left MCA infarct - received TPA and revascularization with IR on 2/5. On 2/8 found to have small LV apical thrombus on ECHO. Pharmacy consulted to dose IV heparin, which was switched to bivalirudin 2/15 for HIT. 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 SRA very positive at 4. Heparin allergy has appropriately been added to chart and updated with SRA results.   Transitioned off bivalrudin on 3/14. Now on warfarin alone. INR therapeutic at 2.3 today. Monitoring with noted drug interaction with amiodarone. Hgb down to 9 this AM-anemia of ESRD received Aranesp 3/15 with HD. No bleeding reported.   Goal of Therapy:  INR 2-3 Monitor platelets by anticoagulation protocol: Yes   Plan:  - Warfarin 5mg  po x1 tonight.  - Monitor daily INR, DDI  with amiodarone, s/sx of bleeding   [name], PharmD PGY1 Pharmacy Resident

## 2019-06-02 NOTE — Progress Notes (Signed)
Pt with very large episode of projectile vomiting, with distal end of cotrak vomited out of the mouth. TF turned off, Cortrak bridle cut and tube removed. Temperature 38.5, no enteral access and flexiseal present with large amounts of liquid stool. Requested IV tylenol for fevers.  Also of note- pt with several spells of staring off to the left with eyes wide and mouth open. Blinks to threat and responds to tactile stimuli after several seconds, able to follow commands immediately afterwards.   MD notified of events.

## 2019-06-02 NOTE — Progress Notes (Signed)
Spoke with Dr. Elsworth Soho about trach change today.  Dr Elsworth Soho advised me to hold off until pt bp stabilized.

## 2019-06-02 NOTE — Progress Notes (Signed)
NAME:  Carlos Senn., MRN:  [id number], DOB:  1985-11-30, LOS: 31 ADMISSION DATE:  04/18/2019, CONSULTATION DATE:  04/19/2019 REFERRING MD:  Dr. Leonel Ramsay, CHIEF COMPLAINT:  Slurred speech  Brief History   34 yo male smoker found to have slurred speech and Rt sided weakness.  Admitted with left MCA CVA and multiple smaller embolic infarcts s/p tPA and thrombectomy in IR.  Additionally found to have left ventricular  thrombus.    Course complicated by septic shock r/t HCAP, AKI requiring CRRT, polymorphic VT and cardiogenic shock.  Intermittent pressor dependence, required tracheostomy  Past Medical History  Systolic CHF with non ischemic CM, Cocaine abuse, OSA, DM Hx of CHF (EF 15%)  Hx myocarditis Smoker 1/2 ppd   Significant Hospital Events   2/05 Admit, tPA, IR thrombectomy 2/6 100% on fvent at 2300  Overnight requiring increasing levophed and phenylephrine.    2/6: switched to levophed, epi, started antibiotics, started on CRRT.  2/9  developed wide-complex tachycardia and hypotension, started on amiodarone drip, increase Levophed drip 2/15 drop in platelets, heparin stopped, bival started 2/17 Hypotensive and back on Levophed 2/18 trach, lines changed 2/19 started milrinone , fever 103 2/20 Episode of wide-complex tachycardia yesterday, changed from Levophed to vasopressin 2/22 stopping vanc. Still on inotrope support. Some AF w/ RVR. Had to be placed back on pressors. ivabradine added 2/23 still on pressors/ CRRT continued 2/24 tmax 98.4, Remains on CRRT, even UF, remains anuric, Levophed at 14 mcg/min, Milrinone at 0.125 mcg/kg/min, Coox 63.4 Doing well this morning on ATC, no events overnight.  Midodrine added   2/25 more interactive, remains on ATC, tmax 99.5/ WBC 21.4, ~900 ml liquid stool/ 24 hours, neg Cdiff, Levophed up to 20 mcg/min, CVP 2, ,milrinone remains at 0.125 mcg/kg/min, coox 92.7, Off CRRT since last night ~2000 s/p clotted x 3 off citrate, restarted    3/1 Amio drip restarted for WCT/atrial flutter, milrinone turned off 3/2 CRRT stopped, permacath placed 3/5 crrt resumed 3/6 started on pressors for crrt tolerance 3/8 No longer on CRRT or pressor support  3/12 Tolerated PS for >2 hours  3/17  febrile 106 overnight, shock on pressors, new lines placed, broad-spectrum antibiotics  Consults:  Neuro IR Cardiology  Nephrology Heart Failure  EP   Procedures:  2/5-2/6:  TPA given at 428 am (total of 90 Mg) 520 am went to IR  S/P Lt common carotid arteriogram followed by complete revascularization of occluded LT MCA sup division mid M2 seg with x 1 pass with 37mx 40 mm solitaire X ret river device and penumbra aspiration with TICI 3 revascularization  ETT 2/05 >> 2/18 LIJ CVL 2/5 >>2/18  RT [place] CVL 2/18 >>3/2 RIJ HD cath 2/6 >>2/18 6 shiley cuffed trach 2/18  >>3/4  RT fem HD 2/19 >>3/2 RIJ permacath 3/2>> 3/4 shiley 4 uncuffed.... dislodged placed back 6cuffed shiley that evening 3/5: change to #6 cuffed distal XLT  3/17 right femoral CVL >> 3/17 right femoral arterial line >>  Significant Diagnostic Tests:  CT angio head/neck 2/05 >> occlusion of Lt MCA bifurcation Echo 2/05 >> EF less than 20%, cannot rule out apical thrombus MRI brain 2/11 > extensive acute infarction of multiple areas without large or medium vessel occlusion Echocardiogram 2/8 Left ventricular ejection fraction, by estimation, is <20%. The left  ventricle has severely decreased function. The left ventricle demonstrates  global hypokinesis. The left ventricular internal cavity size was severely  dilated. Possible small 0.8 x 0.6 cm apical thrombus.  3/16 duplex upper and lower extremities >>neg  3/17 head CT >.  New area of hypoattenuation in the left pons , left frontal encephalomalacia related to old infarct  Micro Data:  SARS CoV2 PCR 2/05 >> negative Influenza PCR 2/05 >> negative Urine 2/6 >> ng resp 2/6 >> nml flora BC 2/6 >>ng resp 2/16 >>  ng [facility] 2/19 >> negative  BC x 2 2/25 >>ng Cdiff 2/25 >> neg Trach asp 3/11>> Few candida albicans BC 3/11>> 1 of 4 with CoNS BC 3/12 >>ng   Antimicrobials:  mero 2/6 -2/12 Zosyn 2/6 , 2/19 >> 2/23 vanc 2/6 >> 2/9 , 2/19 >>2/22 Ceftriaxone 3/12> 3/16 3/17 cefepime >> 3/17 vanc >> 3/17 Anidulafungin >>  Interim history/subjective:   Critically ill, on higher doses of Levophed Remains on vasopressin and on vent Febrile 101 last 24 hours Minimal urine output Episode of vomiting with removal of core track  Objective   Blood pressure (!) 87/65, pulse (!) 103, temperature (!) 100.6 F (38.1 C), resp. rate 20, height 6' 1"  (1.854 m), weight 104.2 kg, SpO2 91 %.    Vent Mode: PCV FiO2 (%):  [40 %] 40 % Set Rate:  [20 bmp] 20 bmp PEEP:  [5 cmH20] 5 cmH20 Plateau Pressure:  [17 cmH20-27 cmH20] 21 cmH20   Intake/Output Summary (Last 24 hours) at 06/02/2019 0747 Last data filed at 06/02/2019 0700 Gross per 24 hour  Intake 4783.16 ml  Output 879 ml  Net 3904.16 ml   Filed Weights   05/27/19 1137 05/30/19 0708 06/01/19 0730  Weight: 104.8 kg 106.2 kg 104.2 kg   Physical Exam: General: Well-appearing, no acute distress ,  HENT: [place], AT, OP clear, MMM Neck: Cuffed #6 XLT in place, c/d/i Eyes: EOMI, no scleral icterus Respiratory: No accessory muscle use, bilateral ventilated breath sounds, decrease secretions no crackles, wheezing or rales Cardiovascular: RRR, -M/R/G, no JVD GI: BS+, soft, nontender Extremities:-Edema,-tenderness, femoral lines exit site appears clean Neuro: Awake, blank stare, does not follow commands, moves extremities x 4, purposeful , was following commands for night RN  Chest x-ray 3/17 personally reviewed which shows clear lungs  Labs show decrease in BUN and creatinine postdialysis, normal electrolytes, increasing leukocytosis, therapeutic INR  Resolved Hospital Problem list   HCAP 9/62, Acute metabolic encephalopathy 2nd to hypoxia and renal failure,  Cardiogenic shock  Assessment & Plan:   Septic shock, new 3/17 Hyperthermia 3/17 Fever, leukocytosis secondary suspected aspiration/CAP with LLL infiltrate   . Suspect that he is having episodes of aspiration to cause fevers and leukocytosis.  Plan Titrate Levophed to MAP 65, continue vasopressin Antibiotics broadened to cefepime, vancomycin, added anidulafungin while awaiting blood cultures Await blood and respiratory cultures , If cultures negative, can attribute hyperthermia to pontine infarct   Acute toxic metabolic encephalopathy/U[place]ic encephalopathy Hyperactive delirium  Plan Increase  aripiprazole 10 mg (against seroquel due to drug interactions).  Prozac 20 mg daily Ambien 10 ineffective for insomnia, okay to use Versed as needed  Acute hypoxic, hypercapnic respiratory failure with compromised airway in setting of CVA. Possible recurrent aspiration Failure to wean from vent s/p tracheostomy.  Tolerating PS 15/5 x 3 days prior to worsening 3/16 Plan Cuffed #6 XLT trach in place, following with speech therapy Continue routine trach care PRN suctioning  Spontaneous breathing trials as able  Chronic systolic CHF, non-ischemic Polymorphic VT. Apical thrombus in left ventricle. HLD. Continue amiodarone, aspirin, Lipitor   Lt MCA CVA likely embolic. Left pontine CVA, ?new noted on 3/17 Hx of  cocaine abuse, depression. Plan Appreciate neurology input, may need repeat MRI if fevers persist to reassess pontine infarct  AKI from ATN in setting of hypoxia, cardiogenic shock. Off CVVH since 3/7.    Plan Appreciate nephrology assistance.  HD planned for 2 consecutive days 3/16 and 3/17, tolerating in spite of being on pressors Follow urine output, BMP for azotemia May not need Foley since oliguric   HIT. Anemia of critical illness. - heparin induced Plt Ab 1.692 from 05/02/19 - SRA positive, 90% from 05/03/19 Plan Weaned off bivalirudin. Continue warfarin.  INR  therapeutic Follow CBC Conservative transfusion goal hemoglobin 7  DM type II poorly controlled with hyperglycemia. Plan SSI per protocol Levemir 40 units every 12 Tube feed coverage 8 units every 4 hours-hold until NG replaced    GOC -Mom desires full scope of care.  Remains to be seen whether he will tolerate dialysis since he keeps going back on pressors due to bouts of sepsis/aspiration versus cardiogenic.   Best practice:  Diet: NPO,  TFs DVT prophylaxis: SCDs/ bivalirudin GI prophylaxis: protonix Mobility: progress when able Code Status: full code Disposition: ICU Family: mother  3/17  The patient is critically ill with multiple organ systems failure and requires high complexity decision making for assessment and support, frequent evaluation and titration of therapies, application of advanced monitoring technologies and extensive interpretation of multiple databases. Critical Care Time devoted to patient care services described in this note independent of APP/resident  time is 32 minutes.     [name] MD. Shade Flood. [facility]  If no response to pager , please call [phone]   06/02/2019

## 2019-06-02 NOTE — Progress Notes (Addendum)
eLink Physician-Brief Progress Note Patient Name: Carlos Brewer. DOB: Sep 04, 1985 MRN: [id number]   Date of Service  06/02/2019  HPI/Events of Note  NG tube order discontinued, Cortrak feeding tube in a.m. by Cortrak team, PRN iv Tylenol ordered for fever or moderate pain.  eICU Interventions  See above.        Kerry Kass [place] 06/02/2019, 4:25 AM

## 2019-06-02 NOTE — Progress Notes (Signed)
Occupational Therapy Treatment Note  Upon arrival, pt awake and supine in bed with mother at bedside. Pt demonstrating increased engagement this session compared to prior session. Pt inconsistently following simple, direct cues with significant time, nodding yes/no, and making increased eye contact. Pt requiring Max A +2 for bed mobility and tolerating sitting at EOB with Min-Max A for sitting balance. Pt performing oral care with Max hand over hand to brush teeth with tooth brush. Continue to recommend dc to [facility] and will continue to follow acutely as admitted.     06/02/19 1700  OT Visit Information  Last OT Received On 06/02/19  Assistance Needed +2  PT/OT/SLP Co-Evaluation/Treatment Yes  Reason for Co-Treatment For patient/therapist safety;To address functional/ADL transfers  OT goals addressed during session ADL's and self-care  History of Present Illness Pt is a 34 y/o male smoker who initially presented on 2/5 with slurred speech and Rt sided weakness. Admitted with left MCA CVA and multiple smaller embolic infarcts s/p tPA and thrombectomy in IR.  Additionally found to have left ventricular  thrombus. Hospital course complicated by septic shock r/t HCAP, AKI requiring CRRT, polymorphic VT and cardiogenic shock. Echo EF < 20%. Pt with Intermittent pressor dependence, required tracheostomy. Tolerating trach collar 3/4, back on vent 3/5 with flash pulmonary edema.  Started IHD 3/10.  Precautions  Precautions Fall  Precaution Comments trach/vent/flexiseal  Pain Assessment  Pain Assessment Faces  Faces Pain Scale 4  Pain Location generalized  Pain Descriptors / Indicators Discomfort (squirming)  Pain Intervention(s) Monitored during session;Limited activity within patient's tolerance;Repositioned  Cognition  Arousal/Alertness Awake/alert  Behavior During Therapy Flat affect  Overall Cognitive Status Impaired/Different from baseline  Area of Impairment Attention;Following  commands;Safety/judgement;Problem solving  Current Attention Level Focused;Sustained (Sustaining attention for ~20 seconds)  Following Commands Follows one step commands inconsistently  Safety/Judgement Decreased awareness of safety;Decreased awareness of deficits  Problem Solving Slow processing;Requires verbal cues  General Comments Noting pt with increased engagement. Following simple commands such as "touch my hand" and "let go of tooth brush" with increased time. Increased eye contact this session. Nodding his head yes/no when sitting at EOB.   Difficult to assess due to Tracheostomy  Upper Extremity Assessment  Upper Extremity Assessment RUE deficits/detail;LUE deficits/detail  RUE Deficits / Details Notable weakness bil UEs. RUE weakness > LUE. Poor FM coorindation. AROM to touch therapist's hand with shoulder flexion at ~60 degrees  RUE Coordination decreased fine motor;decreased gross motor  LUE Deficits / Details Decreased strength adn coorindation. able to perform AROM to touch therapist's hand with shoulder felxion ~70 degrees.  LUE Coordination decreased fine motor;decreased gross motor  Lower Extremity Assessment  Lower Extremity Assessment Defer to PT evaluation  ADL  Overall ADL's  Needs assistance/impaired  Eating/Feeding NPO  Grooming Oral care;Sitting;Maximal assistance  Grooming Details (indicate cue type and reason) Pt nodding his head yes with offering of oral care with tooth brush. Max A with hand over hand to maintain grasp of toothbrush with RUE and then bring to mouth. Pt fatigues quickly  General ADL Comments Min-Max A for sitting balance at EOB. Max hand over hand for oral care at EOB.   Bed Mobility  Overal bed mobility Needs Assistance  Bed Mobility Supine to Sit;Sit to Supine  Supine to sit Max assist;+2 for physical assistance  Sit to supine Max assist;+2 for physical assistance  General bed mobility comments pt's legs biased off the right side, but needed  to assist legs over to the left and trunk  assisted up and forward with lift pad to draw and truncal assist  Balance  Overall balance assessment Needs assistance  Sitting-balance support Feet supported;Single extremity supported  Sitting balance-Leahy Scale Zero  Sitting balance - Comments pt needing min assist in the least to maintain midline orientation.  Pt would hold to doing a reaching task or just lifting arms, but then extend back into the therapist  Postural control Posterior lean  Restrictions  Weight Bearing Restrictions No  Transfers  General transfer comment pt fatigued quickly and wanted to return to supine, but due to his restlessness already decided to stay in bed.  General Comments  General comments (skin integrity, edema, etc.) BP down at 70's-80's/ 50's  Other Exercises  Other Exercises hip/knee warm up ROM with AA flexion and graded resistance extention.  OT - End of Session  Equipment Utilized During Treatment Oxygen (vent)  Activity Tolerance Patient tolerated treatment well;Patient limited by fatigue  Patient left in bed;with call bell/phone within reach;with family/visitor present;with nursing/sitter in room  Nurse Communication Mobility status  OT Assessment/Plan  OT Plan Discharge plan remains appropriate  OT Visit Diagnosis Muscle weakness (generalized) (M62.81);Other abnormalities of gait and mobility (R26.89)  OT Frequency (ACUTE ONLY) Min 2X/week  Follow Up Recommendations LTACH;Supervision/Assistance - 24 hour  OT Equipment Other (comment) (TBD)  AM-PAC OT "6 Clicks" Daily Activity Outcome Measure (Version 2)  Help from another person eating meals? 1  Help from another person taking care of personal grooming? 2  Help from another person toileting, which includes using toliet, bedpan, or urinal? 1  Help from another person bathing (including washing, rinsing, drying)? 1  Help from another person to put on and taking off regular upper body clothing? 1   Help from another person to put on and taking off regular lower body clothing? 1  6 Click Score 7  OT Goal Progression  Progress towards OT goals Progressing toward goals  Acute Rehab OT Goals  Patient Stated Goal to d/c home with mother after rehab  OT Goal Formulation With patient  Time For Goal Achievement 06/01/19  Potential to Achieve Goals Good  ADL Goals  Pt Will Perform Grooming with modified independence;sitting  Pt Will Perform Upper Body Dressing sitting;with supervision  Pt/caregiver will Perform Home Exercise Program Increased strength;Increased ROM;Both right and left upper extremity;With Supervision;With written HEP provided  Additional ADL Goal #1 Pt will maintain sitting balance EOB >10 min at supervision level in prep for ADL.  Additional ADL Goal #2 Pt will follow multi step commands with 75% accuracy during functional task completion.  Additional ADL Goal #3 Pt will perform bed mobility with minguard assist as precursor to EOB/OOB ADL.  OT Time Calculation  OT Start Time (ACUTE ONLY) 1232  OT Stop Time (ACUTE ONLY) 1303  OT Time Calculation (min) 31 min  OT General Charges  $OT Visit 1 Visit  OT Treatments  $Self Care/Home Management  8-22 mins    Carlos Brewer MSOT, OTR/L Acute Rehab Pager: [phone] Office: [phone]

## 2019-06-02 NOTE — Progress Notes (Signed)
Pt very restless overnight, kicking legs in air and attempting to pull trach/femoral lines/foley catheter. PRN fentanyl and Ambien given with no effect. MD notified, orders received for Precedex.

## 2019-06-02 NOTE — Progress Notes (Signed)
[facility] Progress Note    Assessment/ Plan:   AKIpresumably due to ATN with ischemic/nephrotoxic insults worsened by cardiogenic shock. Started on CRRT 04/23/19-  3/7.   Tolerated IHD on 3/10 and 3/12 - removed 3 liters.  will keep on MWF schedule- s/p 3/15.  Now that he has tolerated IHD- the issue is with trach will need LTACH.  Have not done perm access due to poor prognosis.  HD 3/17.  Had hyperpyrexia: blood cultures- vanc/ cefepime/ eraxis started 3/17.  Discussed with PCCM- will see what pressor requirement is tomorrow.  If blood cultures are +, TDC will have to come out.  1. Cardiogenic shock- Hx milrinone due to development of a fib with rvr and wide complex tachycardia. -Known severe NICM with EF <20% andmay not tolerate  IHD but seems to be so far   3. Acute stroke- presumed embolic with bilateral infarcts and LV thrombus- received tpa on admission 4. SVT/polymorphic VT- per Cardiology 5. Acute hypoxic respiratory failure- s/p trach  6. 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 HIT Ab+, s/p bivalirudin and now on warfarin alone 7. HTN/vol-  BP low-  Does not appear to have significant volume on board,   By weights seems to be on the lower side- no pul edema by x-ray 3/11 - on max dose midodrine  8. Hypernatremia: mild, have added a little free water in the tube but not much 9. Anemia-   iron stores low, got feraheme on 3/9 and 3/12,  - also  giving ESA - darbe 100 per week 10. Bones-  Phos had been ok -  No binder- rising now, will watch-  For HD tomorrow should help-    intact PTH 70 11. Disposition- overall prognosis is poor with ongoing RRT , trach, as well as cardiogenic shock/cardiomyopathy.  LTACH seems the most likely option  Subjective:    Pressors up.    Objective:   BP 96/64   Pulse 81   Temp (!) 100.6 F (38.1 C)   Resp 20   Ht 6' 1"  (1.854 m)   Wt 104.2 kg   SpO2 100%   BMI 30.31 kg/m   Intake/Output Summary (Last 24 hours) at  06/02/2019 1140 Last data filed at 06/02/2019 0900 Gross per 24 hour  Intake 4443.31 ml  Output 879 ml  Net 3564.31 ml   Weight change:   Physical Exam: Gen: awake in bed, vented, nonverbal. Mom at bedside HEENT - trach on vent CVS: S1S2 no rub Resp:reduced breath sounds Abd: soft/ND Ext:  no edema Access: rightIJtunneled catheter  Imaging: CT HEAD WO CONTRAST  Result Date: 06/01/2019 CLINICAL DATA:  Cerebral hemorrhage suspected EXAM: CT HEAD WITHOUT CONTRAST TECHNIQUE: Contiguous axial images were obtained from the base of the skull through the vertex without intravenous contrast. COMPARISON:  MRI MRA 04/28/2019, CT 04/23/2019 FINDINGS: Brain: New area of hypoattenuation in the left pons. Increasing evolving area of encephalomalacia in the left frontal lobe. Possible expansion expansion of a region of encephalomalacia involving the right frontal lobe and insula when compared to prior. More punctate region of gliotic change at the frontoparietal vertex on the right. Seen on comparison MRI. Further areas of remote infarct within the right cerebellum. No evidence of acute intracranial hemorrhage. No mass effect or midline shift. Basal cisterns are patent. Midline intracranial structures are unremarkable. Cerebellar tonsils are normally positioned. Vascular: No hyperdense vessel or unexpected calcification. Skull: No calvarial fracture or suspicious osseous lesion. No scalp swelling or hematoma. Sinuses/Orbits:  Paranasal sinuses and mastoid air cells are predominantly clear. Slightly dysconjugate gaze. Included orbital structures are otherwise unremarkable. Other: None IMPRESSION: New area of hypoattenuation in the left pons concerning for subacute to early chronic infarct not seen on comparison imaging. Interval expansion of a region of gliosis and encephalomalacia in the right frontal lobe beyond the territory seen on comparison MR and MRA. Recommend further evaluation with MRI for better  assessment. Likely evolving region of encephalomalacia in the left frontal lobe corresponding to prior infarct on MRI. With additional areas corresponding to remote infarct in the right frontoparietal vertex and right cerebellar hemisphere. No evidence of acute intracranial hemorrhage. Electronically Signed   By: Lovena Le M.D.   On: 06/01/2019 04:37   DG CHEST PORT 1 VIEW  Result Date: 06/01/2019 CLINICAL DATA:  Hypoxia. EXAM: PORTABLE CHEST 1 VIEW COMPARISON:  May 26, 2019. FINDINGS: Stable cardiomegaly. Tracheostomy tube is in grossly good position. Feeding tube is seen entering stomach. Right internal jugular dialysis catheter is unchanged in position. No pneumothorax or pleural effusion is noted. No acute pulmonary disease is noted. Bony thorax is unremarkable. IMPRESSION: No acute cardiopulmonary abnormality seen. Electronically Signed   By: Marijo Conception M.D.   On: 06/01/2019 11:11   DG Abd Portable 1V  Result Date: 06/02/2019 CLINICAL DATA:  OG tube placement EXAM: PORTABLE ABDOMEN - 1 VIEW COMPARISON:  05/25/2019 FINDINGS: OG tube is in the stomach. Mild gaseous distention of the stomach. No evidence of bowel obstruction, organomegaly or free air. IMPRESSION: OG tube tip in the stomach. Electronically Signed   By: Rolm Baptise M.D.   On: 06/02/2019 08:54   VAS [place] LOWER EXTREMITY VENOUS (DVT)  Result Date: 05/31/2019  Lower Venous DVTStudy Indications: Fever, prolonged bed confinement.  Risk Factors: Immobility. Limitations: Body habitus, poor ultrasound/tissue interface and uncooperative. Comparison Study: No prior exam. Performing Technologist: Baldwin Crown ARDMS, RVT  Examination Guidelines: A complete evaluation includes B-mode imaging, spectral Doppler, color Doppler, and power Doppler as needed of all accessible portions of each vessel. Bilateral testing is considered an integral part of a complete examination. Limited examinations for reoccurring indications may be performed as  noted. The reflux portion of the exam is performed with the patient in reverse Trendelenburg.  +---------+---------------+---------+-----------+----------+-----------------+ RIGHT    CompressibilityPhasicitySpontaneityPropertiesThrombus Aging    +---------+---------------+---------+-----------+----------+-----------------+ CFV      Full           Yes      Yes                                    +---------+---------------+---------+-----------+----------+-----------------+ SFJ      Full                                                           +---------+---------------+---------+-----------+----------+-----------------+ FV Prox  Full                                                           +---------+---------------+---------+-----------+----------+-----------------+ FV Mid   Full                                                           +---------+---------------+---------+-----------+----------+-----------------+  FV DistalFull                                                           +---------+---------------+---------+-----------+----------+-----------------+ PFV      Full                                                           +---------+---------------+---------+-----------+----------+-----------------+ POP      Full           Yes      Yes                                    +---------+---------------+---------+-----------+----------+-----------------+ PTV      Full                                         poorly visualized +---------+---------------+---------+-----------+----------+-----------------+ PERO     Full                                         poorly visualized +---------+---------------+---------+-----------+----------+-----------------+   +---------+---------------+---------+-----------+----------+-----------------+ LEFT     CompressibilityPhasicitySpontaneityPropertiesThrombus Aging     +---------+---------------+---------+-----------+----------+-----------------+ CFV      Full           Yes      Yes                                    +---------+---------------+---------+-----------+----------+-----------------+ SFJ      Full                                                           +---------+---------------+---------+-----------+----------+-----------------+ FV Prox  Full                                                           +---------+---------------+---------+-----------+----------+-----------------+ FV Mid   Full                                                           +---------+---------------+---------+-----------+----------+-----------------+ FV DistalFull                                                           +---------+---------------+---------+-----------+----------+-----------------+  PFV      Full                                                           +---------+---------------+---------+-----------+----------+-----------------+ POP      Full           Yes      Yes                                    +---------+---------------+---------+-----------+----------+-----------------+ PTV      Full                                         poorly visualized +---------+---------------+---------+-----------+----------+-----------------+ PERO     Full                                         poorly visualized +---------+---------------+---------+-----------+----------+-----------------+     Summary: RIGHT: - There is no evidence of deep vein thrombosis in the lower extremity. However, portions of this examination were limited- see technologist comments above.  - No cystic structure found in the popliteal fossa.  LEFT: - There is no evidence of deep vein thrombosis in the lower extremity. However, portions of this examination were limited- see technologist comments above.  - No cystic structure found in the popliteal fossa.   *See table(s) above for measurements and observations. Electronically signed by Harold Barban MD on 05/31/2019 at 9:41:23 PM.    Final    VAS [place] UPPER EXTREMITY VENOUS DUPLEX  Result Date: 05/31/2019 UPPER VENOUS STUDY  Indications: fever Limitations: Body habitus, bandages and patient cooperation. Performing Technologist: Antonieta Pert RDMS, RVT  Examination Guidelines: A complete evaluation includes B-mode imaging, spectral Doppler, color Doppler, and power Doppler as needed of all accessible portions of each vessel. Bilateral testing is considered an integral part of a complete examination. Limited examinations for reoccurring indications may be performed as noted.  Right Findings: +----------+------------+---------+-----------+----------+--------------+ RIGHT     CompressiblePhasicitySpontaneousProperties   Summary     +----------+------------+---------+-----------+----------+--------------+ IJV                                                 Not visualized +----------+------------+---------+-----------+----------+--------------+ Subclavian    Full       Yes       Yes                             +----------+------------+---------+-----------+----------+--------------+ Axillary      Full       Yes       Yes                             +----------+------------+---------+-----------+----------+--------------+ Brachial      Full                                                 +----------+------------+---------+-----------+----------+--------------+  Radial        Full                                                 +----------+------------+---------+-----------+----------+--------------+ Ulnar                                               Not visualized +----------+------------+---------+-----------+----------+--------------+ Cephalic    Partial                                                 +----------+------------+---------+-----------+----------+--------------+ Basilic                                             Not visualized +----------+------------+---------+-----------+----------+--------------+  Left Findings: +----------+------------+---------+-----------+----------+--------------+ LEFT      CompressiblePhasicitySpontaneousProperties   Summary     +----------+------------+---------+-----------+----------+--------------+ IJV                                                 Not visualized +----------+------------+---------+-----------+----------+--------------+ Subclavian    Full       Yes       Yes                             +----------+------------+---------+-----------+----------+--------------+ Axillary      Full       Yes       Yes                             +----------+------------+---------+-----------+----------+--------------+ Brachial      Full                                    duplicated   +----------+------------+---------+-----------+----------+--------------+ Radial        Full                                                 +----------+------------+---------+-----------+----------+--------------+ Ulnar                                               Not visualized +----------+------------+---------+-----------+----------+--------------+ Cephalic                                            Not visualized +----------+------------+---------+-----------+----------+--------------+ Basilic  Not visualized +----------+------------+---------+-----------+----------+--------------+  Summary:  Right: No evidence of deep vein thrombosis in the upper extremity. IJV not visualized due to trach colar, basilic and ulnar not visualized due to patient position and cooperation.  Left: No evidence of deep vein thrombosis in the upper extremity. IJV not visualized due to trach colar. Basilic  and ulnar not visualized due to patient position and cooperation. Cephalic not clearly visualized. Small focal area of thrombus near punctore site. Unable to determine if it is cephalic or accessory vein.  *See table(s) above for measurements and observations.  Diagnosing physician: Harold Barban MD Electronically signed by Harold Barban MD on 05/31/2019 at 9:41:36 PM.    Final     Labs: BMET Recent Labs  Lab 05/27/19 0543 05/27/19 0543 05/27/19 1653 05/28/19 1031 05/29/19 0518 05/30/19 5945 05/31/19 8592 06/01/19 0346 06/01/19 0754 06/02/19 0444  NA 145   < > 141 141 145 149*  --  142 145 141  K 4.2   < > 4.4 4.3 4.0 3.7  --  5.0 4.6 3.7  CL 100  --  97* 98 100 101  --   --  99 99  CO2 24  --  19* 23 25 24   --   --  17* 19*  GLUCOSE 267*  --  228* 303* 238* 244*  --   --  441* 78  BUN 98*  --  47* 75* 115* 132*  --   --  145* 70*  CREATININE 6.39*  --  4.06* 5.43* 6.39* 6.74*  --   --  10.51* 6.27*  CALCIUM 8.9  --  9.4 8.5* 8.7* 8.9  --   --  8.7* 9.0  PHOS 3.8  3.9  --  4.8* 4.5 6.5*  6.7* 6.3*  6.3* 5.9*  --  9.6*  --    < > = values in this interval not displayed.   CBC Recent Labs  Lab 05/30/19 0621 05/30/19 0621 05/31/19 0612 06/01/19 0346 06/01/19 0551 06/02/19 0444  WBC 13.3*  --  16.7*  --  14.4* 22.4*  HGB 8.6*   < > 11.6* 10.5* 10.2* 9.0*  HCT 28.5*   < > 35.1* 31.0* 32.5* 28.8*  MCV 102.5*  --  98.0  --  100.3* 103.6*  PLT 246  --  299  --  234 225   < > = values in this interval not displayed.    Medications:    . amiodarone  200 mg Per Tube BID  . ARIPiprazole  10 mg Per Tube Daily  . aspirin  81 mg Per Tube Daily  . atorvastatin  40 mg Per Tube q1800  . chlorhexidine gluconate (MEDLINE KIT)  15 mL Mouth Rinse BID  . Chlorhexidine Gluconate Cloth  6 each Topical Q0600  . darbepoetin (ARANESP) injection - NON-DIALYSIS  100 mcg Subcutaneous Q Mon-1800  . feeding supplement (PRO-STAT SUGAR FREE 64)  60 mL Per Tube BID  . FLUoxetine  20 mg Per Tube  Daily  . free water  100 mL Per Tube Q4H  . insulin aspart  0-20 Units Subcutaneous Q4H  . insulin aspart  8 Units Subcutaneous Q4H  . insulin detemir  40 Units Subcutaneous BID  . mouth rinse  15 mL Mouth Rinse 10 times per day  . midodrine  15 mg Per Tube TID WC  . pantoprazole sodium  40 mg Per Tube Q1200  . sodium chloride flush  10-40 mL Intracatheter Q12H  . warfarin  5 mg Oral  ONCE-1800  . Warfarin - Pharmacist Dosing Inpatient   Does not apply q1800      Madelon Lips  06/02/2019, 11:40 AM

## 2019-06-03 DIAGNOSIS — I472 Ventricular tachycardia: Secondary | ICD-10-CM

## 2019-06-03 DIAGNOSIS — Z930 Tracheostomy status: Secondary | ICD-10-CM | POA: Diagnosis not present

## 2019-06-03 DIAGNOSIS — I959 Hypotension, unspecified: Secondary | ICD-10-CM

## 2019-06-03 DIAGNOSIS — J9601 Acute respiratory failure with hypoxia: Secondary | ICD-10-CM | POA: Diagnosis not present

## 2019-06-03 DIAGNOSIS — R570 Cardiogenic shock: Secondary | ICD-10-CM

## 2019-06-03 DIAGNOSIS — I509 Heart failure, unspecified: Secondary | ICD-10-CM

## 2019-06-03 DIAGNOSIS — R579 Shock, unspecified: Secondary | ICD-10-CM | POA: Diagnosis not present

## 2019-06-03 DIAGNOSIS — N170 Acute kidney failure with tubular necrosis: Secondary | ICD-10-CM | POA: Diagnosis not present

## 2019-06-03 LAB — CBC
HCT: 27.7 % — ABNORMAL LOW (ref 39.0–52.0)
Hemoglobin: 8.2 g/dL — ABNORMAL LOW (ref 13.0–17.0)
MCH: 32.2 pg (ref 26.0–34.0)
MCHC: 29.6 g/dL — ABNORMAL LOW (ref 30.0–36.0)
MCV: 108.6 fL — ABNORMAL HIGH (ref 80.0–100.0)
Platelets: 225 10*3/uL (ref 150–400)
RBC: 2.55 MIL/uL — ABNORMAL LOW (ref 4.22–5.81)
RDW: 19.8 % — ABNORMAL HIGH (ref 11.5–15.5)
WBC: 17.2 10*3/uL — ABNORMAL HIGH (ref 4.0–10.5)
nRBC: 2.6 % — ABNORMAL HIGH (ref 0.0–0.2)

## 2019-06-03 LAB — POCT I-STAT 7, (LYTES, BLD GAS, ICA,H+H)
Acid-Base Excess: 1 mmol/L (ref 0.0–2.0)
Acid-base deficit: 10 mmol/L — ABNORMAL HIGH (ref 0.0–2.0)
Bicarbonate: 15.3 mmol/L — ABNORMAL LOW (ref 20.0–28.0)
Bicarbonate: 24.1 mmol/L (ref 20.0–28.0)
Calcium, Ion: 1.12 mmol/L — ABNORMAL LOW (ref 1.15–1.40)
Calcium, Ion: 1.09 mmol/L — ABNORMAL LOW (ref 1.15–1.40)
HCT: 25 % — ABNORMAL LOW (ref 39.0–52.0)
HCT: 28 % — ABNORMAL LOW (ref 39.0–52.0)
Hemoglobin: 8.5 g/dL — ABNORMAL LOW (ref 13.0–17.0)
Hemoglobin: 9.5 g/dL — ABNORMAL LOW (ref 13.0–17.0)
O2 Saturation: 99 %
O2 Saturation: 99 %
Patient temperature: 100.2
Patient temperature: 38.4
Potassium: 4.3 mmol/L (ref 3.5–5.1)
Potassium: 2.8 mmol/L — ABNORMAL LOW (ref 3.5–5.1)
Sodium: 139 mmol/L (ref 135–145)
Sodium: 138 mmol/L (ref 135–145)
TCO2: 16 mmol/L — ABNORMAL LOW (ref 22–32)
TCO2: 25 mmol/L (ref 22–32)
pCO2 arterial: 29.7 mmHg — ABNORMAL LOW (ref 32.0–48.0)
pCO2 arterial: 35.3 mmHg (ref 32.0–48.0)
pH, Arterial: 7.323 — ABNORMAL LOW (ref 7.350–7.450)
pH, Arterial: 7.447 (ref 7.350–7.450)
pO2, Arterial: 136 mmHg — ABNORMAL HIGH (ref 83.0–108.0)
pO2, Arterial: 150 mmHg — ABNORMAL HIGH (ref 83.0–108.0)

## 2019-06-03 LAB — BASIC METABOLIC PANEL
Anion gap: 29 — ABNORMAL HIGH (ref 5–15)
Anion gap: 17 — ABNORMAL HIGH (ref 5–15)
BUN: 99 mg/dL — ABNORMAL HIGH (ref 6–20)
BUN: 38 mg/dL — ABNORMAL HIGH (ref 6–20)
CO2: 12 mmol/L — ABNORMAL LOW (ref 22–32)
CO2: 21 mmol/L — ABNORMAL LOW (ref 22–32)
Calcium: 9.1 mg/dL (ref 8.9–10.3)
Calcium: 8.6 mg/dL — ABNORMAL LOW (ref 8.9–10.3)
Chloride: 100 mmol/L (ref 98–111)
Chloride: 99 mmol/L (ref 98–111)
Creatinine, Ser: 7.96 mg/dL — ABNORMAL HIGH (ref 0.61–1.24)
Creatinine, Ser: 3.78 mg/dL — ABNORMAL HIGH (ref 0.61–1.24)
GFR calc Af Amer: 9 mL/min — ABNORMAL LOW (ref >60)
GFR calc Af Amer: 23 mL/min — ABNORMAL LOW (ref >60)
GFR calc non Af Amer: 8 mL/min — ABNORMAL LOW (ref >60)
GFR calc non Af Amer: 19 mL/min — ABNORMAL LOW (ref >60)
Glucose, Bld: 154 mg/dL — ABNORMAL HIGH (ref 70–99)
Glucose, Bld: 165 mg/dL — ABNORMAL HIGH (ref 70–99)
Potassium: 4.8 mmol/L (ref 3.5–5.1)
Potassium: 2.9 mmol/L — ABNORMAL LOW (ref 3.5–5.1)
Sodium: 141 mmol/L (ref 135–145)
Sodium: 137 mmol/L (ref 135–145)

## 2019-06-03 LAB — HEPATIC FUNCTION PANEL
ALT: 41 U/L (ref 0–44)
AST: 45 U/L — ABNORMAL HIGH (ref 15–41)
Albumin: 3 g/dL — ABNORMAL LOW (ref 3.5–5.0)
Alkaline Phosphatase: 161 U/L — ABNORMAL HIGH (ref 38–126)
Bilirubin, Direct: 0.2 mg/dL (ref 0.0–0.2)
Indirect Bilirubin: 0.9 mg/dL (ref 0.3–0.9)
Total Bilirubin: 1.1 mg/dL (ref 0.3–1.2)
Total Protein: 6.9 g/dL (ref 6.5–8.1)

## 2019-06-03 LAB — LACTIC ACID, PLASMA
Lactic Acid, Venous: 4 mmol/L (ref 0.5–1.9)
Lactic Acid, Venous: 2.2 mmol/L (ref 0.5–1.9)

## 2019-06-03 LAB — GLUCOSE, CAPILLARY
Glucose-Capillary: 141 mg/dL — ABNORMAL HIGH (ref 70–99)
Glucose-Capillary: 134 mg/dL — ABNORMAL HIGH (ref 70–99)
Glucose-Capillary: 134 mg/dL — ABNORMAL HIGH (ref 70–99)
Glucose-Capillary: 91 mg/dL (ref 70–99)
Glucose-Capillary: 87 mg/dL (ref 70–99)
Glucose-Capillary: 158 mg/dL — ABNORMAL HIGH (ref 70–99)

## 2019-06-03 LAB — PROTIME-INR
INR: 4 — ABNORMAL HIGH (ref 0.8–1.2)
INR: 5 (ref 0.8–1.2)
INR: 4.7 (ref 0.8–1.2)
Prothrombin Time: 39 seconds — ABNORMAL HIGH (ref 11.4–15.2)
Prothrombin Time: 46.8 seconds — ABNORMAL HIGH (ref 11.4–15.2)
Prothrombin Time: 44.5 seconds — ABNORMAL HIGH (ref 11.4–15.2)

## 2019-06-03 LAB — MAGNESIUM: Magnesium: 1.8 mg/dL (ref 1.7–2.4)

## 2019-06-03 MED ORDER — POTASSIUM CHLORIDE 10 MEQ/50ML IV SOLN
10.0000 meq | INTRAVENOUS | Status: AC
  Administered 2019-06-03 – 2019-06-04 (×3): 10 meq via INTRAVENOUS
  Filled 2019-06-03: qty 50

## 2019-06-03 MED ORDER — DOPAMINE-DEXTROSE 3.2-5 MG/ML-% IV SOLN
INTRAVENOUS | Status: AC
  Administered 2019-06-03: 2.5 ug/kg/min via INTRAVENOUS
  Filled 2019-06-03: qty 250

## 2019-06-03 MED ORDER — ATROPINE SULFATE 1 MG/10ML IJ SOSY: 0.5000 mg | PREFILLED_SYRINGE | Freq: Once | INTRAMUSCULAR | Status: DC

## 2019-06-03 MED ORDER — ANTICOAGULANT SODIUM CITRATE 4% (200MG/5ML) IV SOLN
5.0000 mL | Freq: Once | Status: AC
  Administered 2019-06-03: 5 mL
  Filled 2019-06-03: qty 5

## 2019-06-03 MED ORDER — CHLORHEXIDINE GLUCONATE CLOTH 2 % EX PADS
6.0000 | MEDICATED_PAD | Freq: Every day | CUTANEOUS | Status: DC
  Administered 2019-06-03 – 2019-06-12 (×9): 6 via TOPICAL

## 2019-06-03 MED ORDER — DOPAMINE-DEXTROSE 3.2-5 MG/ML-% IV SOLN
2.5000 ug/kg/min | INTRAVENOUS | Status: DC
  Administered 2019-06-04: 2.5 ug/kg/min via INTRAVENOUS

## 2019-06-03 MED ORDER — ATROPINE SULFATE 1 MG/10ML IJ SOSY
PREFILLED_SYRINGE | INTRAMUSCULAR | Status: AC
  Filled 2019-06-03: qty 10

## 2019-06-03 MED ORDER — POTASSIUM CHLORIDE 10 MEQ/100ML IV SOLN
INTRAVENOUS | Status: AC
  Administered 2019-06-03: 10 meq
  Filled 2019-06-03: qty 100

## 2019-06-03 MED ORDER — VANCOMYCIN HCL IN DEXTROSE 1-5 GM/200ML-% IV SOLN
INTRAVENOUS | Status: AC
  Administered 2019-06-03: 1000 mg via INTRAVENOUS
  Filled 2019-06-03: qty 200

## 2019-06-03 NOTE — Progress Notes (Signed)
ANTICOAGULATION CONSULT NOTE  Pharmacy Consult:  Bivalirudin >> Coumadin Indication: stroke 2/5, LV thrombus 2/8, HIT 2/15   Allergies  Allergen Reactions  . Heparin Other (See Comments)    Heparin induced thrombocytopenia. 2/15 HIT OD 1.692. 2/16 SRA positive-90.     Patient Measurements: Height: 6\' 1"  (185.4 cm) Weight: 229 lb 11.5 oz (104.2 kg) IBW/kg (Calculated) : 79.9   Vital Signs: Temp: 100.2 F (37.9 C) (03/19 0845) Temp Source: Bladder (03/19 0400) BP: 105/74 (03/19 0845) Pulse Rate: 75 (03/19 0845)  Labs: Recent Labs    06/01/19 0551 06/01/19 0551 06/01/19 0754 06/02/19 0444 06/03/19 0443  HGB 10.2*   < >  --  9.0* 8.2*  HCT 32.5*  --   --  28.8* 27.7*  PLT 234  --   --  225 225  LABPROT 23.3*  --   --  25.4* 39.0*  INR 2.1*  --   --  2.3* 4.0*  CREATININE  --   --  10.51* 6.27* 7.96*   < > = values in this interval not displayed.    Estimated Creatinine Clearance: 16.4 mL/min (A) (by C-G formula based on SCr of 7.96 mg/dL (H)).  Assessment: 34 yr old male with history of ESRD on HD presented with left MCA infarct - received TPA and revascularization with IR on 2/5. On 2/8 found to have small LV apical thrombus on ECHO. Pharmacy consulted to dose IV heparin, which was switched to bivalirudin 2/15 for HIT. 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 SRA very positive at 65. Heparin allergy has appropriately been added to chart and updated with SRA results.   Transitioned off bivalrudin on 3/14. Now on warfarin alone. INR supratherapeutic at 4 today. Monitoring with noted drug interaction with amiodarone. Hgb down to 8.2 this AM-anemia of ESRD received Aranesp 3/15 with HD. No bleeding reported.   INR elevation may be due to drug interaction with amiodarone, but patient has a hospital history of shock liver and has recently required vasopressors for shock state.   Goal of Therapy:  INR 2-3 Monitor platelets by anticoagulation  protocol: Yes   Plan:  - Hold warfarin dose tonight - Follow-up LFTs  - Monitor daily INR, DDI with amiodarone, s/sx of bleeding   [name], PharmD PGY1 Pharmacy Resident

## 2019-06-03 NOTE — Progress Notes (Signed)
NAME:  Carlos Brewer., MRN:  [id number], DOB:  09/21/1985, LOS: 98 ADMISSION DATE:  04/30/2019, CONSULTATION DATE:  05/12/2019 REFERRING MD:  Dr. Leonel Ramsay, CHIEF COMPLAINT:  Slurred speech  Brief History   34 yo male smoker found to have slurred speech and Rt sided weakness.  Admitted with left MCA CVA and multiple smaller embolic infarcts s/p tPA and thrombectomy in IR.  Additionally found to have left ventricular  thrombus.    Course complicated by septic shock r/t HCAP, AKI requiring CRRT, polymorphic VT and cardiogenic shock.  Intermittent pressor dependence, required tracheostomy  Past Medical History  Systolic CHF with non ischemic CM, Cocaine abuse, OSA, DM Hx of CHF (EF 15%)  Hx myocarditis Smoker 1/2 ppd   Significant Hospital Events   2/05 Admit, tPA, IR thrombectomy 2/6 100% on fvent at 2300  Overnight requiring increasing levophed and phenylephrine.    2/6: switched to levophed, epi, started antibiotics, started on CRRT.  2/9  developed wide-complex tachycardia and hypotension, started on amiodarone drip, increase Levophed drip 2/15 drop in platelets, heparin stopped, bival started 2/17 Hypotensive and back on Levophed 2/18 trach, lines changed 2/19 started milrinone , fever 103 2/20 Episode of wide-complex tachycardia yesterday, changed from Levophed to vasopressin 2/22 stopping vanc. Still on inotrope support. Some AF w/ RVR. Had to be placed back on pressors. ivabradine added 2/23 still on pressors/ CRRT continued 2/24 tmax 98.4, Remains on CRRT, even UF, remains anuric, Levophed at 14 mcg/min, Milrinone at 0.125 mcg/kg/min, Coox 63.4 Doing well this morning on ATC, no events overnight.  Midodrine added   2/25 more interactive, remains on ATC, tmax 99.5/ WBC 21.4, ~900 ml liquid stool/ 24 hours, neg Cdiff, Levophed up to 20 mcg/min, CVP 2, ,milrinone remains at 0.125 mcg/kg/min, coox 92.7, Off CRRT since last night ~2000 s/p clotted x 3 off citrate, restarted    3/1 Amio drip restarted for WCT/atrial flutter, milrinone turned off 3/2 CRRT stopped, permacath placed 3/5 crrt resumed 3/6 started on pressors for crrt tolerance 3/8 No longer on CRRT or pressor support  3/12 Tolerated PS for >2 hours  3/17  febrile 106 overnight, shock on pressors, new lines placed, broad-spectrum antibiotics  Consults:  Neuro IR Cardiology  Nephrology Heart Failure  EP   Procedures:  2/5-2/6:  TPA given at 428 am (total of 90 Mg) 520 am went to IR  S/P Lt common carotid arteriogram followed by complete revascularization of occluded LT MCA sup division mid M2 seg with x 1 pass with 2mx 40 mm solitaire X ret river device and penumbra aspiration with TICI 3 revascularization  ETT 2/05 >> 2/18 LIJ CVL 2/5 >>2/18  RT [place] CVL 2/18 >>3/2 RIJ HD cath 2/6 >>2/18 6 shiley cuffed trach 2/18  >>3/4  RT fem HD 2/19 >>3/2 RIJ permacath 3/2>> 3/4 shiley 4 uncuffed.... dislodged placed back 6cuffed shiley that evening 3/5: change to #6 cuffed distal XLT  3/17 right femoral CVL >> 3/17 right femoral arterial line >>  Significant Diagnostic Tests:  CT angio head/neck 2/05 >> occlusion of Lt MCA bifurcation Echo 2/05 >> EF less than 20%, cannot rule out apical thrombus MRI brain 2/11 > extensive acute infarction of multiple areas without large or medium vessel occlusion Echocardiogram 2/8 Left ventricular ejection fraction, by estimation, is <20%. The left  ventricle has severely decreased function. The left ventricle demonstrates  global hypokinesis. The left ventricular internal cavity size was severely  dilated. Possible small 0.8 x 0.6 cm apical thrombus.  3/16 duplex upper and lower extremities >>neg  3/17 head CT >.  New area of hypoattenuation in the left pons , left frontal encephalomalacia related to old infarct  Micro Data:  SARS CoV2 PCR 2/05 >> negative Influenza PCR 2/05 >> negative Urine 2/6 >> ng resp 2/6 >> nml flora BC 2/6 >>ng resp 2/16 >>  ng [facility] 2/19 >> negative  BC x 2 2/25 >>ng Cdiff 2/25 >> neg Trach asp 3/11>> Few candida albicans BC 3/11>> 1 of 4 with CoNS BC 3/12 >>ng   Antimicrobials:  mero 2/6 -2/12 Zosyn 2/6 , 2/19 >> 2/23 vanc 2/6 >> 2/9 , 2/19 >>2/22 Ceftriaxone 3/12> 3/16 3/17 cefepime >> 3/17 vanc >> 3/17 Anidulafungin >>  Interim history/subjective:  Critically ill on vent and vaso pressor support.  Objective   Blood pressure 105/74, pulse 75, temperature 100.2 F (37.9 C), resp. rate (!) 0, height '6\' 1"'$  (1.854 m), weight 104.2 kg, SpO2 100 %.    Vent Mode: PCV FiO2 (%):  [40 %] 40 % Set Rate:  [20 bmp] 20 bmp PEEP:  [5 cmH20] 5 cmH20 Plateau Pressure:  [21 cmH20-26 cmH20] 21 cmH20   Intake/Output Summary (Last 24 hours) at 06/03/2019 0945 Last data filed at 06/03/2019 0900 Gross per 24 hour  Intake 2619.98 ml  Output 340 ml  Net 2279.98 ml   Filed Weights   05/27/19 1137 05/30/19 0708 06/01/19 0730  Weight: 104.8 kg 106.2 kg 104.2 kg   Physical Exam: General: Well-appearing young male laying in bed, sedated HENT: [place], AT, OP clear, MMM Neck: Cuffed trach in place, c/d/i Eyes: EOMI, no scleral icterus Respiratory: Coarse breath sounds bilaterally. No wheezing Cardiovascular: RRR, -M/R/G, no JVD GI: BS+, soft, nontender Extremities:-Edema,-tenderness Neuro: Sedated, does not follow commands, moves extremities spontaneously x 4   CBC Latest Ref Rng & Units 06/03/2019 06/02/2019 06/01/2019  WBC 4.0 - 10.5 K/uL 17.2(H) 22.4(H) 14.4(H)  Hemoglobin 13.0 - 17.0 g/dL 8.2(L) 9.0(L) 10.2(L)  Hematocrit 39.0 - 52.0 % 27.7(L) 28.8(L) 32.5(L)  Platelets 150 - 400 K/uL 225 225 234   CMP Latest Ref Rng & Units 06/03/2019 06/02/2019 06/01/2019  Glucose 70 - 99 mg/dL 154(H) 78 441(H)  BUN 6 - 20 mg/dL 99(H) 70(H) 145(H)  Creatinine 0.61 - 1.24 mg/dL 7.96(H) 6.27(H) 10.51(H)  Sodium 135 - 145 mmol/L 141 141 145  Potassium 3.5 - 5.1 mmol/L 4.8 3.7 4.6  Chloride 98 - 111 mmol/L 100 99 99  CO2 22 - 32  mmol/L 12(L) 19(L) 17(L)  Calcium 8.9 - 10.3 mg/dL 9.1 9.0 8.7(L)  Total Protein 6.5 - 8.1 g/dL - - -  Total Bilirubin 0.3 - 1.2 mg/dL - - -  Alkaline Phos 38 - 126 U/L - - -  AST 15 - 41 U/L - - -  ALT 0 - 44 U/L - - -    Resolved Hospital Problem list   HCAP 9/89, Acute metabolic encephalopathy 2nd to hypoxia and renal failure, Cardiogenic shock  Assessment & Plan:   Septic shock, new 3/17 - remains on pressors Hyperthermia 3/17 Fever, leukocytosis secondary suspected aspiration/CAP with LLL infiltrate. Suspect that he is having episodes of aspiration to cause fevers and leukocytosis.  Plan Wean levophed and vasopressin for goal >65  Continue broad spectrum antibiotics: cefepime, vancomycin, anidulafungin  ABG and LA now for metabolic acidosis  Acute toxic metabolic encephalopathy/Uremic encephalopathy Hyperactive delirium  Plan Continue aripiprazole 10 mg (against seroquel due to drug interactions).  Prozac 20 mg daily Ambien 10 ineffective for insomnia,  okay to use Versed as needed  Acute hypoxic, hypercapnic respiratory failure with compromised airway in setting of CVA. Possible recurrent aspiration Failure to wean from vent s/p tracheostomy.  Unable to wean due critical illness Plan Cuffed #6 XLT trach in place, following with speech therapy Continue routine trach care PRN suctioning   Chronic systolic CHF, non-ischemic Polymorphic VT. Apical thrombus in left ventricle. HLD. Continue amiodarone, aspirin, Lipitor  Lt MCA CVA likely embolic. Left pontine CVA, ?new noted on 3/17 Hx of cocaine abuse, depression. Plan Appreciate neurology input, may need repeat MRI if fevers persist to reassess pontine infarct  AKI from ATN in setting of hypoxia, cardiogenic shock. Off CVVH since 3/7.    Plan Appreciate nephrology assistance. May require CRRT while in shock Follow urine output/Cr  HIT. Anemia of critical illness. Elevated INR ?hepatic congestion - heparin  induced Plt Ab 1.692 from 05/02/19 - SRA positive, 90% from 05/03/19 Plan Weaned off bivalirudin. Continue warfarin.  INR therapeutic Follow CBC Conservative transfusion goal hemoglobin 7 Repeat INR and obtain LFTs  DM type II poorly controlled with hyperglycemia. Plan SSI per protocol Levemir 40 units every 12 Tube feed coverage 8 units every 4 hours-hold until NG replaced  GOC -Mom desires full scope of care.  Remains to be seen whether he will tolerate dialysis since he keeps going back on pressors due to bouts of sepsis/aspiration versus cardiogenic.   Best practice:  Diet: NPO,  TFs DVT prophylaxis: SCDs/ bivalirudin GI prophylaxis: protonix Mobility: progress when able Code Status: full code Disposition: ICU Family: mother  3/17  The patient is critically ill with multiple organ systems failure and requires high complexity decision making for assessment and support, frequent evaluation and titration of therapies, application of advanced monitoring technologies and extensive interpretation of multiple databases.   Critical Care Time devoted to patient care services described in this note is 40 Minutes.  Rodman Pickle, M.D. [facility] Pulmonary/Critical Care Medicine 06/03/2019 9:45 AM   Please see Amion for pager number to reach on-call Pulmonary and Critical Care Team.

## 2019-06-03 NOTE — Progress Notes (Signed)
[facility] Progress Note    Assessment/ Plan:   1.  AKIpresumably due to ATN with ischemic/nephrotoxic insults worsened by cardiogenic shock. Started on CRRT 04/23/19-  3/7.   Tolerated IHD on 3/10 and 3/12 - removed 3 liters.  will keep on MWF schedule- s/p 3/15.  Now that he has tolerated IHD- the issue is with trach will need LTACH.  Have not done perm access due to poor prognosis.  HD 3/17.  Had hyperpyrexia: blood cultures- vanc/ cefepime/ eraxis started 3/17.  Discussed with PCCM- will attempt IHD today with no vol removal.   2.Acute stroke- presumed embolic with bilateral infarcts and LV thrombus- received tpa on admission 3. SVT/polymorphic VT- per Cardiology 4. Acute hypoxic respiratory failure- s/p trach  5. 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 HIT Ab+, s/p bivalirudin and now on warfarin alone 6. HTN/vol-  BP low-  Does not appear to have significant volume on board,   By weights seems to be on the lower side- no pul edema by x-ray 3/11 - on max dose midodrine  7. Hypernatremia: mild, have added a little free water in the tube but not much 8. Anemia-   iron stores low, got feraheme on 3/9 and 3/12,  - also  giving ESA - darbe 100 per week 9. Bones-  Phos had been ok -  No binder- rising now, will watch-  For HD tomorrow should help-    intact PTH 70 10. Shock: on vanc/ cefepime/ eraxis now-- back on pressors 11. Disposition- overall prognosis is poor with ongoing RRT , trach, as well as cardiogenic shock/cardiomyopathy.  LTACH seems the most likely option  Subjective:    He is actually making some urine- about 200 mL yesterday and already ~300 today.  For HD today on pressor  Objective:   BP (!) 123/91   Pulse 74   Temp (!) 101.3 F (38.5 C)   Resp (!) 24   Ht 6' 1"  (1.854 m)   Wt 108.7 kg   SpO2 100%   BMI 31.62 kg/m   Intake/Output Summary (Last 24 hours) at 06/03/2019 1410 Last data filed at 06/03/2019 1300 Gross per 24 hour  Intake 2466.4  ml  Output 320 ml  Net 2146.4 ml   Weight change:   Physical Exam: Gen: awake in bed, vented, sleeping Mom at bedside HEENT - trach on vent CVS: S1S2 no rub Resp:reduced breath sounds Abd: soft/ND Ext:  no edema Access: rightIJtunneled catheter  Imaging: DG Abd Portable 1V  Result Date: 06/02/2019 CLINICAL DATA:  OG tube placement EXAM: PORTABLE ABDOMEN - 1 VIEW COMPARISON:  05/25/2019 FINDINGS: OG tube is in the stomach. Mild gaseous distention of the stomach. No evidence of bowel obstruction, organomegaly or free air. IMPRESSION: OG tube tip in the stomach. Electronically Signed   By: Rolm Baptise M.D.   On: 06/02/2019 08:54    Labs: BMET Recent Labs  Lab 05/27/19 1653 05/27/19 1653 05/28/19 0349 05/28/19 0349 05/29/19 0518 05/30/19 3614 05/31/19 0612 06/01/19 0346 06/01/19 0754 06/02/19 0444 06/03/19 0443 06/03/19 1133  NA 141   < > 141   < > 145 149*  --  142 145 141 141 139  K 4.4   < > 4.3   < > 4.0 3.7  --  5.0 4.6 3.7 4.8 4.3  CL 97*  --  98  --  100 101  --   --  99 99 100  --   CO2 19*  --  23  --  25 24  --   --  17* 19* 12*  --   GLUCOSE 228*  --  303*  --  238* 244*  --   --  441* 78 154*  --   BUN 47*  --  75*  --  115* 132*  --   --  145* 70* 99*  --   CREATININE 4.06*  --  5.43*  --  6.39* 6.74*  --   --  10.51* 6.27* 7.96*  --   CALCIUM 9.4  --  8.5*  --  8.7* 8.9  --   --  8.7* 9.0 9.1  --   PHOS 4.8*  --  4.5  --  6.5*  6.7* 6.3*  6.3* 5.9*  --  9.6*  --   --   --    < > = values in this interval not displayed.   CBC Recent Labs  Lab 05/31/19 0612 06/01/19 0346 06/01/19 0551 06/02/19 0444 06/03/19 0443 06/03/19 1133  WBC 16.7*  --  14.4* 22.4* 17.2*  --   HGB 11.6*   < > 10.2* 9.0* 8.2* 8.5*  HCT 35.1*   < > 32.5* 28.8* 27.7* 25.0*  MCV 98.0  --  100.3* 103.6* 108.6*  --   PLT 299  --  234 225 225  --    < > = values in this interval not displayed.    Medications:    . amiodarone  200 mg Per Tube BID  . ARIPiprazole  10 mg  Per Tube Daily  . aspirin  81 mg Per Tube Daily  . atorvastatin  40 mg Per Tube q1800  . chlorhexidine gluconate (MEDLINE KIT)  15 mL Mouth Rinse BID  . Chlorhexidine Gluconate Cloth  6 each Topical Daily  . darbepoetin (ARANESP) injection - NON-DIALYSIS  100 mcg Subcutaneous Q Mon-1800  . feeding supplement (PRO-STAT SUGAR FREE 64)  60 mL Per Tube BID  . FLUoxetine  20 mg Per Tube Daily  . free water  100 mL Per Tube Q4H  . insulin aspart  0-20 Units Subcutaneous Q4H  . insulin aspart  8 Units Subcutaneous Q4H  . insulin detemir  40 Units Subcutaneous BID  . mouth rinse  15 mL Mouth Rinse 10 times per day  . midodrine  15 mg Per Tube TID WC  . pantoprazole sodium  40 mg Per Tube Q1200  . sodium chloride flush  10-40 mL Intracatheter Q12H  . Warfarin - Pharmacist Dosing Inpatient   Does not apply q1800      Madelon Lips  06/03/2019, 2:10 PM

## 2019-06-03 NOTE — Progress Notes (Signed)
PT Cancellation Note  Patient Details Name: Carlos Brewer. MRN: [id number] DOB: 1985/06/04   Cancelled Treatment:    Reason Eval/Treat Not Completed: Patient at procedure or test/unavailable.  Pt currently on HD.  Per HD RN he has ~ 2 more hours.  PT will follow up on Monday and attempt to avoid his HD treatment.  Thanks,  Verdene Lennert, PT, DPT  Acute Rehabilitation [phone] pager #[phone] office       Carlos Brewer 06/03/2019, 2:16 PM

## 2019-06-03 NOTE — Consult Note (Addendum)
Cardiology Consultation:   Patient ID: Carlos Brewer. MRN: [id number]; DOB: 11-14-1985  Admit date: 04/27/2019 Date of Consult: 06/03/2019  Primary Care Provider: Patient, No Pcp Per Primary Cardiologist: Ida Rogue, MD  Primary Electrophysiologist:  None    Patient Profile:   Carlos Brewer. is a 34 y.o. male with a hx of chronic systolic heart failure, cocaine abuse, OSA on CPAP, who is being seen today for the evaluation of cardiogenic shock at the request of Dr. Elsworth Soho.  History of Present Illness:   Carlos Brewer was admitted on 07/18/6142 with embolic infarct of the left MCA managed with IV TPA followed by thrombectomy, found to be likely cardioembolic in nature with LVEF of 20% and LV apical thrombus. Course has been c/b septic shock r/t HCAP, AKI requiring CRRT, polymorphic VT and cardiogenic shock, and need for tracheostomy.   Pertinent cardiac history, summarized in Dr. Clayborne Dana note from 05/06/2019, is as follows   Echo 10/2016 EF 15-20%.LHC in 2018 with no significant coronary disease. Cause of cardiomyopathy thought to be heavy ETOH + cocaine. Not candidate for advanced therapies/home inotropes with substance abuse.  Admitted 4/28-07/17/18 with volume overload. Echo showed EF down to 10-15%. Diuresed with IV lasix and metolazone then transitioned to torsemide 40 mg BID. HF meds optimized. He was given metolazone to take PRN. DC weight 276 lbs.   Admitted 2/5 w/ acute MCA stroke, treated w/ tPA and IR thrombectomy. Source felt cardioembolic. Echo showed LV thrombus. EF < 20%. Started on heparin but develop thrombocytopenia.  Heparin switched to bivalirudin 2/2 concern for HITT. Course further c/b shock (felt combined septic + cardiogenic shock) rendering him milrinone dependent on CRRT.  He was determined to be not a candidate for advanced therapies and felt to have a poor prognosis.  He was last seen by the advanced heart failure cardiology team on  05/24/2019.  Recent history  In the past month, milrinone was turned off (3/1) and he was weaned from CRRT and pressor support (3/8). He tolerated iHD (3/10) and pressure support trials (3/12). On 3/17 he developed recurrent fever with septic shock managed with pressors, line exchange, and broad spectrum antibiotics. Source was attributed to aspiration pneumonia. He was on NE and vasopressin, but was transitioned to dopamine this evening after an episode of pulse in the 30s.   Past Medical History:  Diagnosis Date  . CHF (congestive heart failure) ([place])   . Cocaine abuse ([place])   . HFrEF (heart failure with reduced ejection fraction) ([place])    a. 10/2016 Echo: EF 15-20%, Gr2 DD, mildly dil LA/RA.  Marland Kitchen History of cardiac cath    a. 10/2016 Cath: LM nl, LAD min irregs, LCX nl, RCA nl, EF 15%.  . Morbid obesity ([place])   . Myocarditis ([place])    a. 10/2016 Admit w/ CHF and trop elevation; b. 10/2016 Echo: EF 15-20%; c. 10/2016 Cath: Min irregs in LAD otw nl cors, EF 15%, glob HK.  Marland Kitchen NICM (nonischemic cardiomyopathy) ([place])    a. 10/2016 Echo: EF 15-20%, Gr2 DD; b. 10/2016 Cath: Nl cors.  . Palpitations   . Sleep apnea    USES CPAP  . Tobacco abuse     Past Surgical History:  Procedure Laterality Date  . CORONARY ANGIOPLASTY    . IR CT HEAD LTD  04/21/2019  . IR FLUORO GUIDE CV LINE RIGHT  05/17/2019  . IR PATIENT EVAL TECH 0-60 MINS  04/23/2019  . IR PERCUTANEOUS ART THROMBECTOMY/INFUSION INTRACRANIAL INC DIAG  ANGIO  05/09/2019  . IR US GUIDE VASC ACCESS RIGHT  05/17/2019  . NO PAST SURGERIES    . RADIOLOGY WITH ANESTHESIA N/A 05/03/2019   Procedure: RADIOLOGY WITH ANESTHESIA;  Surgeon: Luanne Bras, MD;  Location: [facility];  Service: Radiology;  Laterality: N/A;  . RIGHT/LEFT HEART CATH AND CORONARY ANGIOGRAPHY N/A 10/27/2016   Procedure: RIGHT/LEFT HEART CATH AND CORONARY ANGIOGRAPHY;  Surgeon: Wellington Hampshire, MD;  Location: [facility];  Service: Cardiovascular;  Laterality: N/A;  . WISDOM  TOOTH EXTRACTION  2008     Home Medications:  Prior to Admission medications   Medication Sig Start Date End Date Taking? Authorizing Provider  albuterol (ACCUNEB) 1.25 MG/3ML nebulizer solution Take 3 mLs by nebulization 4 (four) times daily as needed for wheezing or shortness of breath. 08/23/18  Yes [provider]  albuterol (VENTOLIN HFA) 108 (90 Base) MCG/ACT inhaler Inhale 1-2 puffs into the lungs every 6 (six) hours as needed for wheezing or shortness of breath. 07/17/18  Yes Florencia Reasons, MD  aspirin EC 81 MG EC tablet Take 1 tablet (81 mg total) by mouth daily. Patient taking differently: Take 81 mg by mouth at bedtime.  10/30/16  Yes Demetrios Loll, MD  atorvastatin (LIPITOR) 40 MG tablet Take 1 tablet (40 mg total) by mouth daily at 6 PM. 08/23/18  Yes Bensimhon, Shaune Pascal, MD  bacitracin ointment Apply 1 application topically 2 (two) times daily. Patient taking differently: Apply 1 application topically 2 (two) times daily as needed for wound care.  08/28/18  Yes Montine Circle, PA-C  carvedilol (COREG) 3.125 MG tablet Take 1 tablet (3.125 mg total) by mouth 2 (two) times daily with a meal. 08/23/18  Yes Bensimhon, Shaune Pascal, MD  digoxin (LANOXIN) 0.125 MG tablet Take 1 tablet (0.125 mg total) by mouth daily. 08/23/18  Yes Bensimhon, Shaune Pascal, MD  FLUoxetine (PROZAC) 20 MG capsule Take 1 capsule (20 mg total) by mouth daily. 12/19/18  Yes Gherghe, Vella Redhead, MD  insulin aspart (NOVOLOG) 100 UNIT/ML injection Inject 10 Units into the skin 3 (three) times daily before meals.   Yes [provider]  insulin detemir (LEVEMIR) 100 UNIT/ML injection Inject 40 Units into the skin at bedtime.    Yes [provider]  metolazone (ZAROXOLYN) 2.5 MG tablet Take 1 tablet (2.5 mg total) by mouth daily as needed for up to 30 days. Take metolazone 2.4m x1 only for 5pound weight gain, please take potassium 270m x1 with metolazone. Patient taking differently: Take 2.5 mg by mouth daily as needed  (for 5 lb weight gain (take additional potassium 20 meq with each dose).  07/17/18 06/15/19 Yes XuFlorencia ReasonsMD  Multiple Vitamin (MULTIVITAMIN WITH MINERALS) TABS tablet Take 1 tablet by mouth daily with lunch.   Yes [provider]  pantoprazole (PROTONIX) 40 MG tablet Take 40 mg by mouth daily as needed (acid reflux/indigestion).    Yes [provider]  potassium chloride SA (K-DUR) 20 MEQ tablet Take 1 tablet (20 mEq total) by mouth daily. Can take extra one if taking metolazone. Patient taking differently: Take 20 mEq by mouth See admin instructions. Take one tablet (20 meq) by mouth every morning, also take an extra tablet (20 meq) with each dose of metolazone 08/23/18  Yes Bensimhon, DaShaune PascalMD  sacubitril-valsartan (ENTRESTO) 24-26 MG Take 1 tablet by mouth 2 (two) times daily. 08/23/18  Yes Bensimhon, DaShaune PascalMD  spironolactone (ALDACTONE) 25 MG tablet Take 1 tablet (25 mg total)  by mouth daily. Patient taking differently: Take 25 mg by mouth at bedtime.  08/23/18  Yes Bensimhon, Shaune Pascal, MD  torsemide (DEMADEX) 20 MG tablet Take 2 tablets (40 mg total) by mouth 2 (two) times daily. Patient taking differently: Take 40 mg by mouth daily.  08/23/18  Yes Bensimhon, Shaune Pascal, MD  Vitamin D, Ergocalciferol, (DRISDOL) 1.25 MG (50000 UNIT) CAPS capsule Take 50,000 Units by mouth every Thursday.   Yes [provider]    Inpatient Medications: Scheduled Meds: . amiodarone  200 mg Per Tube BID  . ARIPiprazole  10 mg Per Tube Daily  . aspirin  81 mg Per Tube Daily  . atorvastatin  40 mg Per Tube q1800  . atropine  0.5 mg Intravenous Once  . atropine      . chlorhexidine gluconate (MEDLINE KIT)  15 mL Mouth Rinse BID  . Chlorhexidine Gluconate Cloth  6 each Topical Daily  . darbepoetin (ARANESP) injection - NON-DIALYSIS  100 mcg Subcutaneous Q Mon-1800  . feeding supplement (PRO-STAT SUGAR FREE 64)  60 mL Per Tube BID  . FLUoxetine  20 mg Per Tube Daily  . free water  100 mL  Per Tube Q4H  . insulin aspart  0-20 Units Subcutaneous Q4H  . insulin aspart  8 Units Subcutaneous Q4H  . insulin detemir  40 Units Subcutaneous BID  . mouth rinse  15 mL Mouth Rinse 10 times per day  . midodrine  15 mg Per Tube TID WC  . pantoprazole sodium  40 mg Per Tube Q1200  . sodium chloride flush  10-40 mL Intracatheter Q12H  . Warfarin - Pharmacist Dosing Inpatient   Does not apply q1800   Continuous Infusions: . sodium chloride Stopped (06/03/19 2155)  . sodium chloride Stopped (05/21/19 0940)  . anidulafungin Stopped (06/03/19 1910)  . anticoagulant sodium citrate 5 mL (06/01/19 0331)  . ceFEPime (MAXIPIME) IV 200 mL/hr at 06/03/19 2200  . dexmedetomidine (PRECEDEX) IV infusion 0.2 mcg/kg/hr (06/03/19 2200)  . DOPamine    . DOPamine    . feeding supplement (VITAL 1.5 CAL) 1,000 mL (06/03/19 1953)  . norepinephrine (LEVOPHED) Adult infusion 8 mcg/min (06/03/19 2200)  . potassium chloride    . potassium chloride    . vancomycin 10 mL/hr at 06/03/19 2200  . vasopressin (PITRESSIN) infusion - *FOR SHOCK* 0.03 Units/min (06/03/19 2200)   PRN Meds: sodium chloride, acetaminophen (TYLENOL) oral liquid 160 mg/5 mL, anticoagulant sodium citrate, bisacodyl, docusate, fentaNYL (SUBLIMAZE) injection, lip balm, loperamide HCl, midazolam, ondansetron (ZOFRAN) IV, sodium chloride flush, zolpidem  Allergies:    Allergies  Allergen Reactions  . Heparin Other (See Comments)    Heparin induced thrombocytopenia. 2/15 HIT OD 1.692. 2/16 SRA positive-90.     Social History:   Social History   Socioeconomic History  . Marital status: Married    Spouse name: Not on file  . Number of children: Not on file  . Years of education: Not on file  . Highest education level: Not on file  Occupational History  . 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 Smoking status: Current Every Day Smoker    Packs/day: 0.50    Types: Cigarettes  . Smokeless tobacco: Never Used    Substance and Sexual Activity  . Alcohol use: Yes  . Drug use: Not Currently    Types: Cocaine    Comment: quit 10/23/2016   . Sexual activity: Yes    Birth control/protection: None  Other  Topics Concern  . Not on file  Social History Narrative   ** Merged History Encounter **       ** Merged History Encounter **   Lives in [place] w/ girlfriend.  He does not routinely exercise.   Social Determinants of Health   Financial Resource Strain:   . Difficulty of Paying Living Expenses:   Food Insecurity:   . Worried About Charity fundraiser in the Last Year:   . Arboriculturist in the Last Year:   Transportation Needs:   . Film/video editor (Medical):   Marland Kitchen Lack of Transportation (Non-Medical):   Physical Activity:   . Days of Exercise per Week:   . Minutes of Exercise per Session:   Stress:   . Feeling of Stress :   Social Connections:   . Frequency of Communication with Friends and Family:   . Frequency of Social Gatherings with Friends and Family:   . Attends Religious Services:   . Active Member of Clubs or Organizations:   . Attends Archivist Meetings:   Marland Kitchen Marital Status:   Intimate Partner Violence:   . Fear of Current or Ex-Partner:   . Emotionally Abused:   Marland Kitchen Physically Abused:   . Sexually Abused:     Family History:   Family History  Problem Relation Age of Onset  . Healthy Mother   . Heart failure Father        EF is 35%     ROS:  Unable to obtain  Physical Exam/Data:   Vitals:   06/03/19 2235 06/03/19 2240 06/03/19 2245 06/03/19 2250  BP: 127/78 135/85 130/75 125/72  Pulse: (!) 35  (!) 103 (!) 105  Resp: (!) 23 (!) 24 (!) 22 20  Temp: (!) 101.1 F (38.4 C) (!) 101.1 F (38.4 C) (!) 100.9 F (38.3 C) (!) 100.9 F (38.3 C)  TempSrc:      SpO2: 100%  100% 100%  Weight:      Height:        Intake/Output Summary (Last 24 hours) at 06/03/2019 2252 Last data filed at 06/03/2019 2200 Gross per 24 hour  Intake 2325.89 ml   Output 501 ml  Net 1824.89 ml   Last 3 Weights 06/03/2019 06/03/2019 06/01/2019  Weight (lbs) 239 lb 3.2 oz 239 lb 10.2 oz 229 lb 11.5 oz  Weight (kg) 108.5 kg 108.7 kg 104.2 kg     Body mass index is 31.56 kg/m.  General: Lying in bed awake and looking around, .  Slightly restless, does not answer questions HEENT: trach and NG tube in place Lymph: no adenopathy Neck: No JVD Endocrine:  No thryomegaly Vascular: No carotid bruits; FA pulses 2+ bilaterally without bruits  Cardiac:  normal S1, S2; RRR; 2 out of 6 systolic murmur.  Cannot appreciate S3 or S4 Lungs: Mechanical breath sounds, good air movement bilaterally. Abd: soft, nontender, no hepatomegaly  Ext: Feet cool, but shins knees and thighs warm.  No edema Musculoskeletal:  No deformities, BUE and BLE strength normal and equal Skin: warm and dry  Neuro: Awake on trach, turns to voice, does not follow commands Psych:  Normal affect   EKG:  The EKG was personally reviewed and demonstrates:  SINUS TACHYCARDIA WITH IVCD  Telemetry:  Telemetry was personally reviewed and demonstrates: Sinus rhythm and sinus tachycardia with periods of frequent PVCs including bigeminy and 8-10 beat runs of NSVT.  No bradycardia on telemetry  Relevant CV Studies:  Echo 04/26/2019  1. Left ventricular ejection fraction, by visual estimation, is <20%. The left ventricle has severely decreased function. There is no left ventricular hypertrophy.  2. Apex is not well-visualized. In setting of stroke with severe LV systolic dysfunction, recommend repeat TTE with contrast to rule out LV apical thrombus  3. Severely dilated left ventricular internal cavity size.  4. Left ventricular diastolic parameters are consistent with Grade III diastolic dysfunction (restrictive).  5. Elevated left atrial pressure.  6. The left ventricle demonstrates global hypokinesis.  7. Global right ventricle has normal systolic function.The right ventricular size is mildly  enlarged.  8. Left atrial size was moderately dilated.  9. Right atrial size was normal.  10. The mitral valve is normal in structure. Mild mitral valve  regurgitation.  11. The tricuspid valve is normal in structure. Tricuspid valve regurgitation is trivial.  12. The aortic valve is normal in structure. Aortic valve regurgitation is not visualized. No evidence of aortic valve sclerosis or stenosis.  13. The pulmonic valve was not well visualized. Pulmonic valve regurgitation is not visualized.   Echo 04/25/2019 1. Left ventricular ejection fraction, by estimation, is <20%. The left ventricle has severely decreased function. The left ventricle demonstrates global hypokinesis. The left ventricular internal cavity size was severely dilated. Possible small 0.8 x 0.6 cm apical thrombus. Somewhat subtle finding, could confirm with cardiac MRI. However, if patient has had CVA, would be reasonable to anticoagulate given severity of LV dysfunction if no contraindications to anticoagulation.  Laboratory Data:  High Sensitivity Troponin:  No results for input(s): TROPONINIHS in the last 720 hours.   Chemistry Recent Labs  Lab 06/02/19 0444 06/02/19 0444 06/03/19 0443 06/03/19 1133 06/03/19 1734  NA 141   < > 141 139 136  K 3.7   < > 4.8 4.3 2.8*  CL 99  --  100  --  96*  CO2 19*  --  12*  --  22  GLUCOSE 78  --  154*  --  113*  BUN 70*  --  99*  --  28*  CREATININE 6.27*  --  7.96*  --  2.82*  CALCIUM 9.0  --  9.1  --  8.9  GFRNONAA 11*  --  8*  --  28*  GFRAA 12*  --  9*  --  32*  ANIONGAP 23*  --  29*  --  18*   < > = values in this interval not displayed.    Recent Labs  Lab 05/30/19 0621 06/01/19 0754 06/03/19 1040  PROT  --   --  6.9  ALBUMIN 2.7* 3.2* 3.0*  AST  --   --  45*  ALT  --   --  41  ALKPHOS  --   --  161*  BILITOT  --   --  1.1   Hematology Recent Labs  Lab 06/01/19 0551 06/01/19 0551 06/02/19 0444 06/03/19 0443 06/03/19 1133  WBC 14.4*  --  22.4* 17.2*   --   RBC 3.24*  --  2.78* 2.55*  --   HGB 10.2*   < > 9.0* 8.2* 8.5*  HCT 32.5*   < > 28.8* 27.7* 25.0*  MCV 100.3*  --  103.6* 108.6*  --   MCH 31.5  --  32.4 32.2  --   MCHC 31.4  --  31.3 29.6*  --   RDW 18.2*  --  18.9* 19.8*  --   PLT 234  --  225 225  --    < > =  values in this interval not displayed.   BNPNo results for input(s): BNP, PROBNP in the last 168 hours.  DDimer No results for input(s): DDIMER in the last 168 hours.   Assessment and Plan:   The cardiology consult service was asked to reevaluate Mr. Weiand in the setting of worsening hypotension with ventricular ectopy and slow pulse rates.   He will be important to determine the nature of current hypotension, rather it represents cardiogenic or distributive physiology.  He is currently warm to touch with no volume overload, stable mental exam, normal pulse pressure, and mild lactate elevation, most of which points against florid cardiogenic shock though he is chronically in a low output state. He has been assessed to have recurrent aspiration pneumonia and it being treated for this. It is likely he has recurrent septic (distributive) shock on top of his low output state. Venous O2 by co-oximetry is 69.4, which is consistent with distributive or mixed shock. Goal is >55.   On review of telemetry and vitals, pulse oximeter and arterial line detected an effective pulse rate in the 30s while electrical heart rate was in the 60s to 70 bpm range with ventricular ectopy.  His potassium level is 2.8.  He clearly has limited perfusion with PVCs, therefore will benefit from potassium supplementation to limit ectopy and preserve sinus rhythm.  We will aim to limit the dose of chronotropic agents (dopamine, dobutamine, norepinephrine, etc). as this will only worsen ectopy and he does not need chronotropic support.  1. Chronic heart failure with acute on chronic hypotension  -Now back on low-dose inotropy with dopamine at a rate of 2.5.  As co-oximetry does is normal-high, recommend stopping this. Check Co-ox again in the morning.  -First-line therapy for cardiogenic shock as well as for septic shock is norepinephrine.  I recommend titrating norepinephrine to maintain MAP greater than 65 mmHg.  Can continue midodrine for baseline blood pressure support. -Cannot start GDMT as he is requiring pressors  2. Ventricular ectopy with NSVT -Aggressive potassium repletion, targeting level greater than 4 -Monitor serum magnesium targeting a level greater than 2 -Continue amiodarone maintenance dose of 400 mg a day  3. Acute CVA with severe residual deficits due to extensive R cerebellar, midbrain, R MCA and L MCA infarcts - likely cardioembolic - s/p clot extraction - Working with PT/OT   4. Acute hypoxic respiratory failure - s/p trach, failing vent wean   5.AKI to end-stage renal disease -Tolerating HD   6. Apical Thrombus - supratherapeutic on warfarin; dosed per pharmacy  7. Aspiration pneumonia - on broad spectrum antibiotics   For questions or updates, please contact [place] Please consult [email or web address] for contact info under   Signed, Osvaldo Shipper, MD  06/03/2019 10:52 PM

## 2019-06-03 NOTE — Progress Notes (Signed)
SLP Cancellation Note  Patient Details Name: Carlos Brewer. MRN: [id number] DOB: [birth year]   Cancelled treatment:       Reason Eval/Treat Not Completed: Medical issues which prohibited therapy. Pt on HD this afternoon and remains on the vent. Note that pt continues to have difficulty weaning off of the vent. Recommend considering inline PMV trials. Will attempt with clearance from MD.    Osie Bond., M.A. [facility] Pager [phone] Office [phone]  06/03/2019, 2:38 PM

## 2019-06-03 NOTE — Progress Notes (Signed)
OT Cancellation Note  Patient Details Name: Carlos Brewer. MRN: [id number] DOB: [birth year]   Cancelled Treatment:    Reason Eval/Treat Not Completed: Patient at procedure or test/ unavailable(CRRT. Will return as schedule allows.)  [place], OTR/L Acute Rehab Pager: [phone] Office: [phone] 06/03/2019, 2:42 PM

## 2019-06-03 NOTE — Procedures (Signed)
Cortrak  Person Inserting Tube:  Lynett Brasil, RD Tube Type:  Cortrak - 43 inches Tube Location:  Left nare Initial Placement:  Stomach Secured by: Bridle Technique Used to Measure Tube Placement:  Documented cm marking at nare/ corner of mouth Cortrak Secured At:  90 cm   No x-ray is required. RN may begin using tube.   If the tube becomes dislodged please keep the tube and contact the Cortrak team at [email or web address] ([email or web address]) for replacement.  If after hours and replacement cannot be delayed, place a NG tube and confirm placement with an abdominal x-ray.    Mariana Single RD, LDN Clinical Nutrition Pager listed in [place]

## 2019-06-04 DIAGNOSIS — J9601 Acute respiratory failure with hypoxia: Secondary | ICD-10-CM | POA: Diagnosis present

## 2019-06-04 DIAGNOSIS — R509 Fever, unspecified: Secondary | ICD-10-CM | POA: Diagnosis not present

## 2019-06-04 DIAGNOSIS — R579 Shock, unspecified: Secondary | ICD-10-CM | POA: Diagnosis not present

## 2019-06-04 DIAGNOSIS — A419 Sepsis, unspecified organism: Secondary | ICD-10-CM | POA: Diagnosis not present

## 2019-06-04 DIAGNOSIS — R6521 Severe sepsis with septic shock: Secondary | ICD-10-CM | POA: Diagnosis not present

## 2019-06-04 DIAGNOSIS — Z930 Tracheostomy status: Secondary | ICD-10-CM | POA: Diagnosis not present

## 2019-06-04 LAB — COMPREHENSIVE METABOLIC PANEL
ALT: 34 U/L (ref 0–44)
AST: 37 U/L (ref 15–41)
Albumin: 2.9 g/dL — ABNORMAL LOW (ref 3.5–5.0)
Alkaline Phosphatase: 178 U/L — ABNORMAL HIGH (ref 38–126)
Anion gap: 18 — ABNORMAL HIGH (ref 5–15)
BUN: 46 mg/dL — ABNORMAL HIGH (ref 6–20)
CO2: 22 mmol/L (ref 22–32)
Calcium: 8.7 mg/dL — ABNORMAL LOW (ref 8.9–10.3)
Chloride: 97 mmol/L — ABNORMAL LOW (ref 98–111)
Creatinine, Ser: 4.24 mg/dL — ABNORMAL HIGH (ref 0.61–1.24)
GFR calc Af Amer: 20 mL/min — ABNORMAL LOW (ref >60)
GFR calc non Af Amer: 17 mL/min — ABNORMAL LOW (ref >60)
Glucose, Bld: 98 mg/dL (ref 70–99)
Potassium: 3 mmol/L — ABNORMAL LOW (ref 3.5–5.1)
Sodium: 137 mmol/L (ref 135–145)
Total Bilirubin: 1.3 mg/dL — ABNORMAL HIGH (ref 0.3–1.2)
Total Protein: 6.9 g/dL (ref 6.5–8.1)

## 2019-06-04 LAB — COOXEMETRY PANEL
Carboxyhemoglobin: 2.5 % — ABNORMAL HIGH (ref 0.5–1.5)
Methemoglobin: 0.7 % (ref 0.0–1.5)
O2 Saturation: 69.4 %
Total hemoglobin: 8 g/dL — ABNORMAL LOW (ref 12.0–16.0)

## 2019-06-04 LAB — CBC
HCT: 26.4 % — ABNORMAL LOW (ref 39.0–52.0)
Hemoglobin: 8 g/dL — ABNORMAL LOW (ref 13.0–17.0)
MCH: 31.6 pg (ref 26.0–34.0)
MCHC: 30.3 g/dL (ref 30.0–36.0)
MCV: 104.3 fL — ABNORMAL HIGH (ref 80.0–100.0)
Platelets: 211 10*3/uL (ref 150–400)
RBC: 2.53 MIL/uL — ABNORMAL LOW (ref 4.22–5.81)
RDW: 20.4 % — ABNORMAL HIGH (ref 11.5–15.5)
WBC: 11.3 10*3/uL — ABNORMAL HIGH (ref 4.0–10.5)
nRBC: 0.9 % — ABNORMAL HIGH (ref 0.0–0.2)

## 2019-06-04 LAB — BASIC METABOLIC PANEL
Anion gap: 18 — ABNORMAL HIGH (ref 5–15)
BUN: 28 mg/dL — ABNORMAL HIGH (ref 6–20)
CO2: 22 mmol/L (ref 22–32)
Calcium: 8.9 mg/dL (ref 8.9–10.3)
Chloride: 96 mmol/L — ABNORMAL LOW (ref 98–111)
Creatinine, Ser: 2.82 mg/dL — ABNORMAL HIGH (ref 0.61–1.24)
GFR calc Af Amer: 32 mL/min — ABNORMAL LOW (ref >60)
GFR calc non Af Amer: 28 mL/min — ABNORMAL LOW (ref >60)
Glucose, Bld: 113 mg/dL — ABNORMAL HIGH (ref 70–99)
Potassium: 2.8 mmol/L — ABNORMAL LOW (ref 3.5–5.1)
Sodium: 136 mmol/L (ref 135–145)

## 2019-06-04 LAB — GLUCOSE, CAPILLARY
Glucose-Capillary: 77 mg/dL (ref 70–99)
Glucose-Capillary: 102 mg/dL — ABNORMAL HIGH (ref 70–99)
Glucose-Capillary: 152 mg/dL — ABNORMAL HIGH (ref 70–99)
Glucose-Capillary: 109 mg/dL — ABNORMAL HIGH (ref 70–99)
Glucose-Capillary: 124 mg/dL — ABNORMAL HIGH (ref 70–99)
Glucose-Capillary: 100 mg/dL — ABNORMAL HIGH (ref 70–99)

## 2019-06-04 LAB — PROTIME-INR
INR: 3.9 — ABNORMAL HIGH (ref 0.8–1.2)
Prothrombin Time: 37.9 seconds — ABNORMAL HIGH (ref 11.4–15.2)

## 2019-06-04 MED ORDER — INSULIN ASPART 100 UNIT/ML ~~LOC~~ SOLN
6.0000 [IU] | SUBCUTANEOUS | Status: DC
  Administered 2019-06-04 – 2019-06-08 (×21): 6 [IU] via SUBCUTANEOUS

## 2019-06-04 MED ORDER — SODIUM CHLORIDE 0.9 % IV SOLN
1.0000 g | Freq: Every day | INTRAVENOUS | Status: AC
  Administered 2019-06-04 – 2019-06-06 (×3): 1 g via INTRAVENOUS
  Filled 2019-06-04 (×3): qty 1

## 2019-06-04 MED ORDER — POTASSIUM CHLORIDE 10 MEQ/100ML IV SOLN
10.0000 meq | INTRAVENOUS | Status: AC
  Administered 2019-06-04 (×2): 10 meq via INTRAVENOUS
  Filled 2019-06-04 (×2): qty 100

## 2019-06-04 MED ORDER — NOREPINEPHRINE 4 MG/250ML-% IV SOLN: 0.0000 ug/min | INTRAVENOUS | Status: DC

## 2019-06-04 MED ORDER — NOREPINEPHRINE 16 MG/250ML-% IV SOLN
0.0000 ug/min | INTRAVENOUS | Status: DC
  Administered 2019-06-04: 12 ug/min via INTRAVENOUS
  Administered 2019-06-04: 2 ug/min via INTRAVENOUS
  Filled 2019-06-04 (×2): qty 250

## 2019-06-04 NOTE — Progress Notes (Addendum)
Patient ID: Mariah Harn., male   DOB: Mar 29, 1985, 34 y.o.   MRN: [id number]    Advanced Heart Failure Rounding Note   Subjective:    Remains on vent through trach. Has been getting intermittent HD, last on 3/19.   On 3/17, became febrile again and started on vancomycin/cefepime/Eraxis. Tm 100.9 last night.  Thought to be possible aspiration PNA.  Developed septic shock picture again.    Last night, noted to have frequent PVCs.  Plan had been to stop dopamine and transition to norepinephrine but this never happened.  Today, MAP stable with HR in 70s, no ectopy.     Objective:   Weight Range:  Vital Signs:   Temp:  [98.8 F (37.1 C)-101.5 F (38.6 C)] 99 F (37.2 C) (03/20 1100) Pulse Rate:  [33-125] 74 (03/20 1100) Resp:  [0-29] 18 (03/20 1100) BP: (88-135)/(54-91) 102/66 (03/20 1100) SpO2:  [91 %-100 %] 100 % (03/20 1100) Arterial Line BP: (35-159)/(4-81) 107/59 (03/20 1100) FiO2 (%):  [40 %] 40 % (03/20 0805) Weight:  [108.5 kg-108.7 kg] 108.5 kg (03/19 1700) Last BM Date: 06/03/19  Weight change: Filed Weights   06/01/19 0730 06/03/19 1226 06/03/19 1700  Weight: 104.2 kg 108.7 kg 108.5 kg    Intake/Output:   Intake/Output Summary (Last 24 hours) at 06/04/2019 1137 Last data filed at 06/04/2019 1100 Gross per 24 hour  Intake 2351.6 ml  Output 771 ml  Net 1580.6 ml    PHYSICAL EXAM: General: NAD Neck: JVP 8 cm, no thyromegaly or thyroid nodule. Tracheostomy Lungs: Clear to auscultation bilaterally with normal respiratory effort. CV: Nondisplaced PMI.  Heart regular S1/S2, no S3/S4, no murmur.  No peripheral edema.  Abdomen: Soft, nontender, no hepatosplenomegaly, no distention.  Skin: Intact without lesions or rashes.  Neurologic: Alert/follows commands.  Extremities: No clubbing or cyanosis.  HEENT: Normal.   Telemetry: Sinus 70s personally reviewed    Labs: Basic Metabolic Panel: Recent Labs  Lab  0000 05/29/19 0518 05/29/19 0518  05/30/19 0174 05/31/19 0612 06/01/19 0346 06/01/19 0754 06/01/19 0754 06/02/19 0444 06/02/19 0444 06/03/19 0443 06/03/19 0443 06/03/19 1133 06/03/19 1734 06/03/19 2231 06/03/19 2242 06/04/19 0500  NA  --  145   < > 149*  --    < > 145   < > 141   < > 141   < > 139 136 137 138 137  K  --  4.0   < > 3.7  --    < > 4.6   < > 3.7   < > 4.8   < > 4.3 2.8* 2.9* 2.8* 3.0*  CL   < > 100   < > 101  --   --  99   < > 99  --  100  --   --  96* 99  --  97*  CO2   < > 25   < > 24  --   --  17*   < > 19*  --  12*  --   --  22 21*  --  22  GLUCOSE   < > 238*   < > 244*  --   --  441*   < > 78  --  154*  --   --  113* 165*  --  98  BUN   < > 115*   < > 132*  --   --  145*   < > 70*  --  99*  --   --  28* 38*  --  46*  CREATININE   < > 6.39*   < > 6.74*  --   --  10.51*   < > 6.27*  --  7.96*  --   --  2.82* 3.78*  --  4.24*  CALCIUM   < > 8.7*   < > 8.9  --   --  8.7*   < > 9.0   < > 9.1   < >  --  8.9 8.6*  --  8.7*  MG  --  2.5*  --  2.6*  --   --   --   --   --   --   --   --   --   --  1.8  --   --   PHOS  --  6.5*  6.7*  --  6.3*  6.3* 5.9*  --  9.6*  --   --   --   --   --   --   --   --   --   --    < > = values in this interval not displayed.    Liver Function Tests: Recent Labs  Lab 05/29/19 0518 05/30/19 0621 06/01/19 0754 06/03/19 1040 06/04/19 0500  AST  --   --   --  45* 37  ALT  --   --   --  41 34  ALKPHOS  --   --   --  161* 178*  BILITOT  --   --   --  1.1 1.3*  PROT  --   --   --  6.9 6.9  ALBUMIN 2.7* 2.7* 3.2* 3.0* 2.9*   No results for input(s): LIPASE, AMYLASE in the last 168 hours. No results for input(s): AMMONIA in the last 168 hours.  CBC: Recent Labs  Lab 05/31/19 0612 06/01/19 0346 06/01/19 0551 06/01/19 0551 06/02/19 0444 06/03/19 0443 06/03/19 1133 06/03/19 2242 06/04/19 0500  WBC 16.7*  --  14.4*  --  22.4* 17.2*  --   --  11.3*  HGB 11.6*   < > 10.2*   < > 9.0* 8.2* 8.5* 9.5* 8.0*  HCT 35.1*   < > 32.5*   < > 28.8* 27.7* 25.0* 28.0* 26.4*    MCV 98.0  --  100.3*  --  103.6* 108.6*  --   --  104.3*  PLT 299  --  234  --  225 225  --   --  211   < > = values in this interval not displayed.    Cardiac Enzymes: No results for input(s): CKTOTAL, CKMB, CKMBINDEX, TROPONINI in the last 168 hours.  BNP: BNP (last 3 results) Recent Labs    12/13/18 1750 12/15/18 0405 04/23/19 0945  BNP 1,156.1* 1,161.0* 2,909.9*    ProBNP (last 3 results) No results for input(s): PROBNP in the last 8760 hours.    Other results:  Imaging: No results found.   Medications:     Scheduled Medications: . amiodarone  200 mg Per Tube BID  . ARIPiprazole  10 mg Per Tube Daily  . aspirin  81 mg Per Tube Daily  . atorvastatin  40 mg Per Tube q1800  . atropine  0.5 mg Intravenous Once  . chlorhexidine gluconate (MEDLINE KIT)  15 mL Mouth Rinse BID  . Chlorhexidine Gluconate Cloth  6 each Topical Daily  . darbepoetin (ARANESP) injection - NON-DIALYSIS  100 mcg Subcutaneous Q Mon-1800  . feeding supplement (PRO-STAT SUGAR FREE 64)  60 mL Per Tube BID  . FLUoxetine  20 mg Per Tube Daily  . free water  100 mL Per Tube Q4H  . insulin aspart  0-20 Units Subcutaneous Q4H  . insulin aspart  6 Units Subcutaneous Q4H  . insulin detemir  40 Units Subcutaneous BID  . mouth rinse  15 mL Mouth Rinse 10 times per day  . pantoprazole sodium  40 mg Per Tube Q1200  . sodium chloride flush  10-40 mL Intracatheter Q12H  . Warfarin - Pharmacist Dosing Inpatient   Does not apply q1800    Infusions: . sodium chloride 5 mL/hr at 06/04/19 1100  . sodium chloride Stopped (05/21/19 0940)  . anticoagulant sodium citrate 5 mL (06/01/19 0331)  . dexmedetomidine (PRECEDEX) IV infusion 0.3 mcg/kg/hr (06/04/19 1100)  . feeding supplement (VITAL 1.5 CAL) 20 mL/hr at 06/04/19 0800  . norepinephrine (LEVOPHED) Adult infusion    . potassium chloride    . vasopressin (PITRESSIN) infusion - *FOR SHOCK* Stopped (06/03/19 2224)    PRN Medications: sodium chloride,  anticoagulant sodium citrate, bisacodyl, docusate, fentaNYL (SUBLIMAZE) injection, lip balm, loperamide HCl, midazolam, ondansetron (ZOFRAN) IV, sodium chloride flush, zolpidem   Assessment/Plan:   1. Shock - Recurrent septic shock, on broad spectrum abx.   - Appears more stable this morning, on dopamine 2.5.  Overnight, having a lot of ectopy with dopamine.  Given suspected septic shock, will stop dopamine and transition to norepinephrine if needed (may be able to stop pressor altogether).   2. Acute on chronic systolic HF due to NICM - Echo EF < 20% - On midodrine for BP support so unable to start HF meds - Volume status looks ok on exam, controlled by HD.   3. AKI -> ESRD - started on CRRT 04/23/19, now on iHD.  Next dialysis Monday.  Volume status looks ok.   4. Acute CVA with severe residual deficits due to extensive R cerebellar, midbrain, R MCA and L MCA infarcts  - likely cardioembolic - s/p clot extraction  5. Acute hypoxic respiratory failure - s/p trach, has failed vent wean and will likely need LTACH.   6. Frequent PVCs/ventricular bigeminy/atrial tachycardia flutter - PVCs last night while on dopamine but better now.  As above, will stop dopamine and use norepinephrine if needed.   7. Apical Thrombus - Warfarin.   8. ID - Suspect recurrent septic shock/infection noted 3/17.  Possible aspiration PNA.  - Broad spectrum antimicrobials per CCM.   Length of Stay: 38   Loralie Champagne  MD 06/04/2019, 11:37 AM  Advanced Heart Failure Team Pager [phone] (M-F; [place])  Please contact [facility] for night-coverage after hours (4p -7a ) and weekends on [email or web address]

## 2019-06-04 NOTE — Progress Notes (Signed)
Patient placed back on rest mode with this check from ATC

## 2019-06-04 NOTE — Progress Notes (Signed)
Heart rhythm vent bigeminy 80s with pulse (both from art line waveform and femoral doppler) in the 30s. Extremities very cold to the touch. DBP and MAP remained the same as when pt was in NSR, however SBP elevated to the 150s on art line. [place] notified. Dopamine gtt started, vasopressin turned off, and levo titrated to off. Replacing potassium (10 mEq bags x 3) for level of 2.8. Co-ox obtained.   Currently pt is in NSR, with rate and pulse both in the 80s. Extremities warm, pulses able to be manually palpated.

## 2019-06-04 NOTE — Progress Notes (Signed)
[facility] Progress Note    Assessment/ Plan:   1.  AKIpresumably due to ATN with ischemic/nephrotoxic insults worsened by cardiogenic shock. Started on CRRT 04/23/19-  3/7.   Tolerated IHD on 3/10 and 3/12 - removed 3 liters.  will keep on MWF schedule- s/p 3/15.  Now that he has tolerated IHD- the issue is with trach will need LTACH.  Have not done perm access due to poor prognosis.  HD 3/17.  Had hyperpyrexia: blood cultures- vanc/ cefepime/ eraxis started 3/17.  S/p IHD 3/19, nothing planned until Monday.  Will do 4K bath to prevent ectopy   2.Acute stroke- presumed embolic with bilateral infarcts and LV thrombus- received tpa on admission 3. SVT/polymorphic VT- per Cardiology 4. Acute hypoxic respiratory failure- s/p trach  5. 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 HIT Ab+, s/p bivalirudin and now on warfarin alone 6. HTN/vol-  BP low-  Does not appear to have significant volume on board,   By weights seems to be on the lower side- no pul edema by x-ray 3/11 - on max dose midodrine  7. Hypernatremia: mild, have added a little free water in the tube but not much 8. Anemia-   iron stores low, got feraheme on 3/9 and 3/12,  - also  giving ESA - darbe 100 per week 9. Bones-  Phos had been ok -  No binder- rising now, will watch-  For HD tomorrow should help-    intact PTH 70 10. Shock: on vanc/ cefepime/ eraxis now-- back on pressors 11. Disposition- overall prognosis is poor with ongoing RRT , trach, as well as cardiogenic shock/cardiomyopathy.  LTACH seems the most likely option  Subjective:    S/p HD yesterday had some bigeminy last night, K repleted.    Objective:   BP 106/71   Pulse 80   Temp 99 F (37.2 C)   Resp 20   Ht 6' 1"  (1.854 m)   Wt 108.5 kg   SpO2 96%   BMI 31.56 kg/m   Intake/Output Summary (Last 24 hours) at 06/04/2019 1044 Last data filed at 06/04/2019 0700 Gross per 24 hour  Intake 2260.03 ml  Output 711 ml  Net 1549.03 ml   Weight  change:   Physical Exam: Gen: awake in bed, vented, sleeping Mom at bedside HEENT - trach on vent CVS: S1S2 no rub Resp:reduced breath sounds Abd: soft/ND Ext:  no edema Access: rightIJtunneled catheter  Imaging: No results found.  Labs: BMET Recent Labs  Lab 05/29/19 0518 05/29/19 0518 05/30/19 6834 05/31/19 0612 06/01/19 0346 06/01/19 0754 06/01/19 0754 06/02/19 0444 06/03/19 0443 06/03/19 1133 06/03/19 1734 06/03/19 2231 06/03/19 2242 06/04/19 0500  NA 145   < > 149*  --    < > 145   < > 141 141 139 136 137 138 137  K 4.0   < > 3.7  --    < > 4.6   < > 3.7 4.8 4.3 2.8* 2.9* 2.8* 3.0*  CL 100   < > 101  --   --  99  --  99 100  --  96* 99  --  97*  CO2 25   < > 24  --   --  17*  --  19* 12*  --  22 21*  --  22  GLUCOSE 238*   < > 244*  --   --  441*  --  78 154*  --  113* 165*  --  98  BUN 115*   < > 132*  --   --  145*  --  70* 99*  --  28* 38*  --  46*  CREATININE 6.39*   < > 6.74*  --   --  10.51*  --  6.27* 7.96*  --  2.82* 3.78*  --  4.24*  CALCIUM 8.7*   < > 8.9  --   --  8.7*  --  9.0 9.1  --  8.9 8.6*  --  8.7*  PHOS 6.5*  6.7*  --  6.3*  6.3* 5.9*  --  9.6*  --   --   --   --   --   --   --   --    < > = values in this interval not displayed.   CBC Recent Labs  Lab 06/01/19 0551 06/01/19 0551 06/02/19 0444 06/02/19 0444 06/03/19 0443 06/03/19 1133 06/03/19 2242 06/04/19 0500  WBC 14.4*  --  22.4*  --  17.2*  --   --  11.3*  HGB 10.2*   < > 9.0*   < > 8.2* 8.5* 9.5* 8.0*  HCT 32.5*   < > 28.8*   < > 27.7* 25.0* 28.0* 26.4*  MCV 100.3*  --  103.6*  --  108.6*  --   --  104.3*  PLT 234  --  225  --  225  --   --  211   < > = values in this interval not displayed.    Medications:    . amiodarone  200 mg Per Tube BID  . ARIPiprazole  10 mg Per Tube Daily  . aspirin  81 mg Per Tube Daily  . atorvastatin  40 mg Per Tube q1800  . atropine  0.5 mg Intravenous Once  . chlorhexidine gluconate (MEDLINE KIT)  15 mL Mouth Rinse BID  .  Chlorhexidine Gluconate Cloth  6 each Topical Daily  . darbepoetin (ARANESP) injection - NON-DIALYSIS  100 mcg Subcutaneous Q Mon-1800  . feeding supplement (PRO-STAT SUGAR FREE 64)  60 mL Per Tube BID  . FLUoxetine  20 mg Per Tube Daily  . free water  100 mL Per Tube Q4H  . insulin aspart  0-20 Units Subcutaneous Q4H  . insulin aspart  8 Units Subcutaneous Q4H  . insulin detemir  40 Units Subcutaneous BID  . mouth rinse  15 mL Mouth Rinse 10 times per day  . pantoprazole sodium  40 mg Per Tube Q1200  . sodium chloride flush  10-40 mL Intracatheter Q12H  . Warfarin - Pharmacist Dosing Inpatient   Does not apply q1800      Madelon Lips  06/04/2019, 10:44 AM

## 2019-06-04 NOTE — Progress Notes (Addendum)
ANTICOAGULATION/ANTIBIOTIC CONSULT NOTE  Pharmacy Consult:  Bivalirudin >> Coumadin, Cefepime Indication: stroke 2/5, LV thrombus 2/8, HIT 2/15; sepsis  Allergies  Allergen Reactions  . Heparin Other (See Comments)    Heparin induced thrombocytopenia. 2/15 HIT OD 1.692. 2/16 SRA positive-90.     Patient Measurements: Height: 6\' 1"  (185.4 cm) Weight: 239 lb 3.2 oz (108.5 kg) IBW/kg (Calculated) : 79.9   Vital Signs: Temp: 99 F (37.2 C) (03/20 0700) Temp Source: Bladder (03/20 0400) BP: 106/71 (03/20 0700) Pulse Rate: 80 (03/20 0700)  Labs: Recent Labs    06/02/19 0444 06/02/19 0444 06/03/19 0443 06/03/19 0443 06/03/19 1040 06/03/19 1133 06/03/19 1133 06/03/19 1734 06/03/19 2231 06/03/19 2242 06/04/19 0500 06/04/19 0628  HGB 9.0*   < > 8.2*   < >  --  8.5*   < >  --   --  9.5* 8.0*  --   HCT 28.8*   < > 27.7*   < >  --  25.0*  --   --   --  28.0* 26.4*  --   PLT 225  --  225  --   --   --   --   --   --   --  211  --   LABPROT 25.4*   < > 39.0*   < > 46.8*  --   --  44.5*  --   --   --  37.9*  INR 2.3*   < > 4.0*   < > 5.0*  --   --  4.7*  --   --   --  3.9*  CREATININE 6.27*   < > 7.96*   < >  --   --   --  2.82* 3.78*  --  4.24*  --    < > = values in this interval not displayed.    Estimated Creatinine Clearance: 31.4 mL/min (A) (by C-G formula based on SCr of 4.24 mg/dL (H)).  Assessment: 34 yr old male with history of ESRD on HD presented with left MCA infarct - received TPA and revascularization with IR on 2/5. On 2/8 found to have small LV apical thrombus on ECHO. Pharmacy consulted to dose IV heparin, which was switched to bivalirudin 2/15 for HIT. 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 SRA very positive at 31. Heparin allergy has appropriately been added to chart and updated with SRA results. Transitioned off bivalrudin on 3/14. Now on warfarin alone.  INR supratherapeutic at 3.9 today. Monitoring with noted drug  interaction with amiodarone. Hgb down to 8 this AM-anemia of ESRD received Aranesp 3/15 with HD. No bleeding reported.   INR elevation may be due to drug interaction with amiodarone or combination of this, tube feeds being held and restarted at lower rate (not at goal), and false elevation due to hemodialysis. Patient also has a hospital history of shock liver and has recently required vasopressors for shock state. LFTs WNL this AM.   Source of shock may be sepsis for which patient has been receiving multiple antibiotics. This is day #9 of total antibiotics. On 3/17, patient was switched from ceftriaxone (originally on for aspiration PNA) to vancomycin, Eraxis, and cefepime. Vancomycin and Eraxis were discontinued 3/20, but pharmacy was re-consulted for cefepime dosing. WBC 11.3, fevers resolving. All recent cultures negative, MRSA PCR on 3/10 positive.  Antibiotics CTX 3/12 >> 3/17 Vanc 3/17 >> 3/20 Eraxis 3/17 >> 3/20 Cefepime 3/17 >>  Goal of Therapy:  INR 2-3  Monitor platelets by anticoagulation protocol: Yes   Plan:  - Hold warfarin dose tonight - Monitor daily INR, DDI with amiodarone, s/sx of bleeding - Continue cefepime 1g IV q24hr - F/u HD, cultures and sensitivities   [name], PharmD PGY1 Pharmacy Resident

## 2019-06-04 NOTE — Progress Notes (Signed)
NAME:  Carlos Schoon., MRN:  [id number], DOB:  06-06-1985, LOS: 46 ADMISSION DATE:  04/27/2019, CONSULTATION DATE:  04/19/2019 REFERRING MD:  Dr. Leonel Ramsay, CHIEF COMPLAINT:  Slurred speech  Brief History   34 yo male smoker found to have slurred speech and Rt sided weakness.  Admitted with left MCA CVA and multiple smaller embolic infarcts s/p tPA and thrombectomy in IR.  Additionally found to have left ventricular  thrombus.    Course complicated by septic shock r/t HCAP, AKI requiring CRRT, polymorphic VT and cardiogenic shock.  Intermittent pressor dependence, required tracheostomy  Past Medical History  Systolic CHF with non ischemic CM, Cocaine abuse, OSA, DM Hx of CHF (EF 15%)  Hx myocarditis Smoker 1/2 ppd   Significant Hospital Events   2/05 Admit, tPA, IR thrombectomy 2/6 100% on fvent at 2300  Overnight requiring increasing levophed and phenylephrine.    2/6: switched to levophed, epi, started antibiotics, started on CRRT.  2/9  developed wide-complex tachycardia and hypotension, started on amiodarone drip, increase Levophed drip 2/15 drop in platelets, heparin stopped, bival started 2/17 Hypotensive and back on Levophed 2/18 trach, lines changed 2/19 started milrinone , fever 103 2/20 Episode of wide-complex tachycardia yesterday, changed from Levophed to vasopressin 2/22 stopping vanc. Still on inotrope support. Some AF w/ RVR. Had to be placed back on pressors. ivabradine added 2/23 still on pressors/ CRRT continued 2/24 tmax 98.4, Remains on CRRT, even UF, remains anuric, Levophed at 14 mcg/min, Milrinone at 0.125 mcg/kg/min, Coox 63.4 Doing well this morning on ATC, no events overnight.  Midodrine added   2/25 more interactive, remains on ATC, tmax 99.5/ WBC 21.4, ~900 ml liquid stool/ 24 hours, neg Cdiff, Levophed up to 20 mcg/min, CVP 2, ,milrinone remains at 0.125 mcg/kg/min, coox 92.7, Off CRRT since last night ~2000 s/p clotted x 3 off citrate, restarted    3/1 Amio drip restarted for WCT/atrial flutter, milrinone turned off 3/2 CRRT stopped, permacath placed 3/5 crrt resumed 3/6 started on pressors for crrt tolerance 3/8 No longer on CRRT or pressor support  3/12 Tolerated PS for >2 hours  3/17  febrile 106 overnight, shock on pressors, new lines placed, broad-spectrum antibiotics  Consults:  Neuro IR Cardiology  Nephrology Heart Failure  EP   Procedures:  2/5-2/6:  TPA given at 428 am (total of 90 Mg) 520 am went to IR  S/P Lt common carotid arteriogram followed by complete revascularization of occluded LT MCA sup division mid M2 seg with x 1 pass with 22mx 40 mm solitaire X ret river device and penumbra aspiration with TICI 3 revascularization  ETT 2/05 >> 2/18 [facility] CVL 2/5 >>2/18  RT [place] CVL 2/18 >>3/2 RIJ HD cath 2/6 >>2/18 6 shiley cuffed trach 2/18  >>3/4  RT fem HD 2/19 >>3/2 RIJ permacath 3/2>> 3/4 shiley 4 uncuffed.... dislodged placed back 6cuffed shiley that evening 3/5: change to #6 cuffed distal XLT  3/17 right femoral CVL >> 3/17 right femoral arterial line >>  Significant Diagnostic Tests:  CT angio head/neck 2/05 >> occlusion of Lt MCA bifurcation Echo 2/05 >> EF less than 20%, cannot rule out apical thrombus MRI brain 2/11 > extensive acute infarction of multiple areas without large or medium vessel occlusion Echocardiogram 2/8 Left ventricular ejection fraction, by estimation, is <20%. The left  ventricle has severely decreased function. The left ventricle demonstrates  global hypokinesis. The left ventricular internal cavity size was severely  dilated. Possible small 0.8 x 0.6 cm apical thrombus.  3/16 duplex upper and lower extremities >>neg  3/17 head CT >.  New area of hypoattenuation in the left pons , left frontal encephalomalacia related to old infarct  Micro Data:  SARS CoV2 PCR 2/05 >> negative Influenza PCR 2/05 >> negative Urine 2/6 >> ng resp 2/6 >> nml flora BC 2/6 >>ng resp 2/16 >>  ng [facility] 2/19 >> negative  BC x 2 2/25 >>ng Cdiff 2/25 >> neg Trach asp 3/11>> Few candida albicans BC 3/11>> 1 of 4 with CoNS BC 3/12 >>ng  BC x 2 3/17 >>>  Antimicrobials:  mero 2/6 -2/12 Zosyn 2/6 , 2/19 >> 2/23 vanc 2/6 >> 2/9 , 2/19 >>2/22 Ceftriaxone 3/12> 3/16 3/17 vanc >> 3/19 3/17 Anidulafungin >> 3/19 3/17 cefepime >>    Scheduled Meds: . amiodarone  200 mg Per Tube BID  . ARIPiprazole  10 mg Per Tube Daily  . aspirin  81 mg Per Tube Daily  . atorvastatin  40 mg Per Tube q1800  . atropine  0.5 mg Intravenous Once  . chlorhexidine gluconate (MEDLINE KIT)  15 mL Mouth Rinse BID  . Chlorhexidine Gluconate Cloth  6 each Topical Daily  . darbepoetin (ARANESP) injection - NON-DIALYSIS  100 mcg Subcutaneous Q Mon-1800  . feeding supplement (PRO-STAT SUGAR FREE 64)  60 mL Per Tube BID  . FLUoxetine  20 mg Per Tube Daily  . free water  100 mL Per Tube Q4H  . insulin aspart  0-20 Units Subcutaneous Q4H  . insulin aspart  6 Units Subcutaneous Q4H  . insulin detemir  40 Units Subcutaneous BID  . mouth rinse  15 mL Mouth Rinse 10 times per day  . pantoprazole sodium  40 mg Per Tube Q1200  . sodium chloride flush  10-40 mL Intracatheter Q12H  . Warfarin - Pharmacist Dosing Inpatient   Does not apply q1800   Continuous Infusions: . sodium chloride 5 mL/hr at 06/04/19 1100  . sodium chloride Stopped (05/21/19 0940)  . anticoagulant sodium citrate 5 mL (06/01/19 0331)  . dexmedetomidine (PRECEDEX) IV infusion 0.3 mcg/kg/hr (06/04/19 1100)  . DOPamine 2.5 mcg/kg/min (06/04/19 1100)  . feeding supplement (VITAL 1.5 CAL) 20 mL/hr at 06/04/19 0800  . potassium chloride    . vasopressin (PITRESSIN) infusion - *FOR SHOCK* Stopped (06/03/19 2224)   PRN Meds:.sodium chloride, anticoagulant sodium citrate, bisacodyl, docusate, fentaNYL (SUBLIMAZE) injection, lip balm, loperamide HCl, midazolam, ondansetron (ZOFRAN) IV, sodium chloride flush, zolpidem   Interim history/subjective:    Alert appearing on psv but not consistently f/c   Objective   Blood pressure 102/66, pulse 74, temperature 99 F (37.2 C), resp. rate 18, height 6' 1"  (1.854 m), weight 108.5 kg, SpO2 100 %.    Vent Mode: CPAP;PSV FiO2 (%):  [40 %] 40 % Set Rate:  [20 bmp] 20 bmp PEEP:  [5 cmH20] 5 cmH20 Pressure Support:  [18 cmH20] 18 cmH20 Plateau Pressure:  [21 cmH20-22 cmH20] 22 cmH20   Intake/Output Summary (Last 24 hours) at 06/04/2019 1122 Last data filed at 06/04/2019 0800 Gross per 24 hour  Intake 2277.92 ml  Output 711 ml  Net 1566.92 ml   Filed Weights   06/01/19 0730 06/03/19 1226 06/03/19 1700  Weight: 104.2 kg 108.7 kg 108.5 kg   Physical Exam: Pt alert,  nad on PSV at 18 cm  No jvd Oropharynx clear,  Neck supple Lungs with min rhonchi bilaterally RRR no s3 or or sign murmur Abd obese soft with nl excursion  Extr warm with no edema  or clubbing noted       CBC Latest Ref Rng & Units 06/04/2019 06/03/2019 06/03/2019  WBC 4.0 - 10.5 K/uL 11.3(H) - -  Hemoglobin 13.0 - 17.0 g/dL 8.0(L) 9.5(L) 8.5(L)  Hematocrit 39.0 - 52.0 % 26.4(L) 28.0(L) 25.0(L)  Platelets 150 - 400 K/uL 211 - -   CMP Latest Ref Rng & Units 06/04/2019 06/03/2019 06/03/2019  Glucose 70 - 99 mg/dL 98 - 165(H)  BUN 6 - 20 mg/dL 46(H) - 38(H)  Creatinine 0.61 - 1.24 mg/dL 4.24(H) - 3.78(H)  Sodium 135 - 145 mmol/L 137 138 137  Potassium 3.5 - 5.1 mmol/L 3.0(L) 2.8(L) 2.9(L)  Chloride 98 - 111 mmol/L 97(L) - 99  CO2 22 - 32 mmol/L 22 - 21(L)  Calcium 8.9 - 10.3 mg/dL 8.7(L) - 8.6(L)  Total Protein 6.5 - 8.1 g/dL 6.9 - -  Total Bilirubin 0.3 - 1.2 mg/dL 1.3(H) - -  Alkaline Phos 38 - 126 U/L 178(H) - -  AST 15 - 41 U/L 37 - -  ALT 0 - 44 U/L 34 - -    Resolved Hospital Problem list   HCAP 1/69, Acute metabolic encephalopathy 2nd to hypoxia and renal failure, Cardiogenic shock  Assessment & Plan:   Septic shock, new 3/17 - remains on low dose dopamine with plan to wean off, esp since causes ectopy  >   Neo would be the least likely pressor to cause ectopy if needed  Hyperthermia 3/17 Fever, leukocytosis secondary suspected aspiration/CAP with LLL infiltrate. Suspect that he is having episodes of aspiration to cause fevers and leukocytosis.  >>>  No obvious source, temp trending down on broad coverage after lines changed out, so rx just maxepime for now    Acute toxic metabolic encephalopathy/Uremic encephalopathy Hyperactive delirium  Plan Continue aripiprazole 10 mg (against seroquel due to drug interactions).  Prozac 20 mg daily Ambien 10 ineffective for insomnia, okay to use Versed as needed   Acute hypoxic, hypercapnic respiratory failure with compromised airway in setting of CVA. Possible recurrent aspiration Failure to wean from vent s/p tracheostomy.  Unable to wean due critical illness Plan Cuffed #6 XLT trach in place, following with speech therapy Continue routine trach care PRN suctioning  >>> continue daily weaning as tolerated   Chronic systolic CHF, non-ischemic Polymorphic VT. Apical thrombus in left ventricle. HLD. Continue amiodarone, aspirin, Lipitor >>>  Try off dopamine   Lt MCA CVA likely embolic. Left pontine CVA, ?new noted on 3/17 Hx of cocaine abuse, depression. Plan Supportive rx     AKI from ATN in setting of hypoxia, cardiogenic shock. Off CVVH since 3/7.    Plan HD with K 4 bath to keep K up / in meantime caution with supplement given next HD not until 3/22  HIT. Anemia of critical illness. Elevated INR ?hepatic congestion - heparin induced Plt Ab 1.692 from 05/02/19 - SRA positive, 90% from 05/03/19   Lab Results  Component Value Date   HGB 8.0 (L) 06/04/2019   HGB 9.5 (L) 06/03/2019   HGB 8.5 (L) 06/03/2019    Lab Results  Component Value Date   PLT 211 06/04/2019   PLT 225 06/03/2019   PLT 225 06/02/2019    Lab Results  Component Value Date   INR 3.9 (H) 06/04/2019   INR 4.7 (HH) 06/03/2019   INR 5.0 (HH) 06/03/2019      Plan Continue warfarin.  INR therapeutic Follow CBC Conservative transfusion goal hemoglobin 7     DM type II  poorly controlled with hyperglycemia. Plan levemir 40 units bid  SSI per protocol       Best practice:  Diet: NPO,  TFs DVT prophylaxis: SCDs/coumadin   GI prophylaxis: protonix Mobility: progress when able Code Status: full code Disposition: ICU Family: mother  Pending     The patient is critically ill with multiple organ systems failure and requires high complexity decision making for assessment and support, frequent evaluation and titration of therapies, application of advanced monitoring technologies and extensive interpretation of multiple databases. Critical Care Time devoted to patient care services described in this note is 45 minutes.    Christinia Gully, MD Pulmonary and [place] [phone] After 6:00 PM or weekends, use Beeper [phone]  After 7:00 pm call Elink  [phone]

## 2019-06-05 DIAGNOSIS — R509 Fever, unspecified: Secondary | ICD-10-CM | POA: Diagnosis not present

## 2019-06-05 DIAGNOSIS — Z930 Tracheostomy status: Secondary | ICD-10-CM | POA: Diagnosis not present

## 2019-06-05 DIAGNOSIS — J9601 Acute respiratory failure with hypoxia: Secondary | ICD-10-CM | POA: Diagnosis not present

## 2019-06-05 DIAGNOSIS — R579 Shock, unspecified: Secondary | ICD-10-CM | POA: Diagnosis not present

## 2019-06-05 LAB — COMPREHENSIVE METABOLIC PANEL
ALT: 30 U/L (ref 0–44)
AST: 31 U/L (ref 15–41)
Albumin: 2.6 g/dL — ABNORMAL LOW (ref 3.5–5.0)
Alkaline Phosphatase: 163 U/L — ABNORMAL HIGH (ref 38–126)
Anion gap: 17 — ABNORMAL HIGH (ref 5–15)
BUN: 66 mg/dL — ABNORMAL HIGH (ref 6–20)
CO2: 18 mmol/L — ABNORMAL LOW (ref 22–32)
Calcium: 8.8 mg/dL — ABNORMAL LOW (ref 8.9–10.3)
Chloride: 102 mmol/L (ref 98–111)
Creatinine, Ser: 4.95 mg/dL — ABNORMAL HIGH (ref 0.61–1.24)
GFR calc Af Amer: 16 mL/min — ABNORMAL LOW (ref >60)
GFR calc non Af Amer: 14 mL/min — ABNORMAL LOW (ref >60)
Glucose, Bld: 137 mg/dL — ABNORMAL HIGH (ref 70–99)
Potassium: 3.3 mmol/L — ABNORMAL LOW (ref 3.5–5.1)
Sodium: 137 mmol/L (ref 135–145)
Total Bilirubin: 1 mg/dL (ref 0.3–1.2)
Total Protein: 6.6 g/dL (ref 6.5–8.1)

## 2019-06-05 LAB — GLUCOSE, CAPILLARY
Glucose-Capillary: 90 mg/dL (ref 70–99)
Glucose-Capillary: 117 mg/dL — ABNORMAL HIGH (ref 70–99)
Glucose-Capillary: 133 mg/dL — ABNORMAL HIGH (ref 70–99)
Glucose-Capillary: 143 mg/dL — ABNORMAL HIGH (ref 70–99)
Glucose-Capillary: 120 mg/dL — ABNORMAL HIGH (ref 70–99)
Glucose-Capillary: 147 mg/dL — ABNORMAL HIGH (ref 70–99)

## 2019-06-05 LAB — COOXEMETRY PANEL
Carboxyhemoglobin: 2.7 % — ABNORMAL HIGH (ref 0.5–1.5)
Methemoglobin: 1 % (ref 0.0–1.5)
O2 Saturation: 99.5 %
Total hemoglobin: 8.6 g/dL — ABNORMAL LOW (ref 12.0–16.0)

## 2019-06-05 LAB — CBC
HCT: 27.7 % — ABNORMAL LOW (ref 39.0–52.0)
Hemoglobin: 8.3 g/dL — ABNORMAL LOW (ref 13.0–17.0)
MCH: 32.3 pg (ref 26.0–34.0)
MCHC: 30 g/dL (ref 30.0–36.0)
MCV: 107.8 fL — ABNORMAL HIGH (ref 80.0–100.0)
Platelets: 204 10*3/uL (ref 150–400)
RBC: 2.57 MIL/uL — ABNORMAL LOW (ref 4.22–5.81)
RDW: 21.4 % — ABNORMAL HIGH (ref 11.5–15.5)
WBC: 9.6 10*3/uL (ref 4.0–10.5)
nRBC: 0.3 % — ABNORMAL HIGH (ref 0.0–0.2)

## 2019-06-05 LAB — PROTIME-INR
INR: 2.6 — ABNORMAL HIGH (ref 0.8–1.2)
Prothrombin Time: 27.4 seconds — ABNORMAL HIGH (ref 11.4–15.2)

## 2019-06-05 MED ORDER — POTASSIUM CHLORIDE 10 MEQ/100ML IV SOLN
10.0000 meq | INTRAVENOUS | Status: AC
  Administered 2019-06-05 (×2): 10 meq via INTRAVENOUS
  Filled 2019-06-05 (×2): qty 100

## 2019-06-05 MED ORDER — WARFARIN SODIUM 2.5 MG PO TABS
2.5000 mg | ORAL_TABLET | Freq: Once | ORAL | Status: AC
  Administered 2019-06-05: 2.5 mg via ORAL
  Filled 2019-06-05: qty 1

## 2019-06-05 NOTE — Progress Notes (Signed)
[facility] Progress Note    Assessment/ Plan:   1.  AKIpresumably due to ATN with ischemic/nephrotoxic insults worsened by cardiogenic shock. Started on CRRT 04/23/19-  3/7.   Tolerated IHD on 3/10 and 3/12 - removed 3 liters.  will keep on MWF schedule- s/p 3/15.  Now that he has tolerated IHD- the issue is with trach will need LTACH.  Have not done perm access due to poor prognosis.  HD 3/17.  Had hyperpyrexia: blood cultures- vanc/ cefepime/ eraxis started 3/17.  S/p IHD 3/19, nothing planned until Monday 3/22.  Will do 4K bath to prevent ectopy--> ? If having some element of renal recovery given increased UOP, will plan for second shift in case not needed.  No heparin.     2.Acute stroke- presumed embolic with bilateral infarcts and LV thrombus- received tpa on admission  3. SVT/polymorphic VT- per Cardiology  4. Acute hypoxic respiratory failure- s/p trach--> trying to wean  5. 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 HIT Ab+, s/p bivalirudin and now on warfarin alone  6. HTN/vol: on and off pressors, on midodrine too   7. Hypernatremia: resvolved have added a little free water in the tube but not much  8. Anemia-   iron stores low, got feraheme on 3/9 and 3/12,  - also  giving ESA - darbe 100 per week  9. Bones-  Phos had been ok -  No binder yet  10. Shock: on vanc/ cefepime/ eraxis now-- back on pressors  11. Disposition- overall prognosis is poor with ongoing RRT , trach, as well as cardiogenic shock/cardiomyopathy.  LTACH seems the most likely option  Subjective:    UOP increasing--> had 410 mL yesterday!    Objective:   BP 119/80 (BP Location: Left Arm)   Pulse 65   Temp 97.9 F (36.6 C) (Bladder)   Resp 18   Ht 6' 1"  (1.854 m)   Wt 108.5 kg   SpO2 100%   BMI 31.56 kg/m   Intake/Output Summary (Last 24 hours) at 06/05/2019 1112 Last data filed at 06/05/2019 0800 Gross per 24 hour  Intake 1925.96 ml  Output 560 ml  Net 1365.96 ml    Weight change:   Physical Exam: Gen: sleeping, vented HEENT - trach on vent CVS: S1S2 no rub Resp:reduced breath sounds Abd: soft/ND Ext:  no edema Access: rightIJtunneled catheter  Imaging: No results found.  Labs: BMET Recent Labs  Lab  0000 05/30/19 6301 05/31/19 0612 06/01/19 0346 06/01/19 0754 06/01/19 0754 06/02/19 0444 06/02/19 0444 06/03/19 0443 06/03/19 1133 06/03/19 1734 06/03/19 2231 06/03/19 2242 06/04/19 0500 06/05/19 0616  NA  --  149*  --    < > 145   < > 141   < > 141 139 136 137 138 137 137  K  --  3.7  --    < > 4.6   < > 3.7   < > 4.8 4.3 2.8* 2.9* 2.8* 3.0* 3.3*  CL   < > 101  --   --  99  --  99  --  100  --  96* 99  --  97* 102  CO2   < > 24  --   --  17*  --  19*  --  12*  --  22 21*  --  22 18*  GLUCOSE   < > 244*  --   --  441*  --  78  --  154*  --  113* 165*  --  98 137*  BUN   < > 132*  --   --  145*  --  70*  --  99*  --  28* 38*  --  46* 66*  CREATININE   < > 6.74*  --   --  10.51*  --  6.27*  --  7.96*  --  2.82* 3.78*  --  4.24* 4.95*  CALCIUM   < > 8.9  --   --  8.7*  --  9.0  --  9.1  --  8.9 8.6*  --  8.7* 8.8*  PHOS  --  6.3*  6.3* 5.9*  --  9.6*  --   --   --   --   --   --   --   --   --   --    < > = values in this interval not displayed.   CBC Recent Labs  Lab 06/02/19 0444 06/02/19 0444 06/03/19 0443 06/03/19 0443 06/03/19 1133 06/03/19 2242 06/04/19 0500 06/05/19 0616  WBC 22.4*  --  17.2*  --   --   --  11.3* 9.6  HGB 9.0*   < > 8.2*   < > 8.5* 9.5* 8.0* 8.3*  HCT 28.8*   < > 27.7*   < > 25.0* 28.0* 26.4* 27.7*  MCV 103.6*  --  108.6*  --   --   --  104.3* 107.8*  PLT 225  --  225  --   --   --  211 204   < > = values in this interval not displayed.    Medications:    . amiodarone  200 mg Per Tube BID  . ARIPiprazole  10 mg Per Tube Daily  . aspirin  81 mg Per Tube Daily  . atorvastatin  40 mg Per Tube q1800  . atropine  0.5 mg Intravenous Once  . chlorhexidine gluconate (MEDLINE KIT)  15 mL  Mouth Rinse BID  . Chlorhexidine Gluconate Cloth  6 each Topical Daily  . darbepoetin (ARANESP) injection - NON-DIALYSIS  100 mcg Subcutaneous Q Mon-1800  . feeding supplement (PRO-STAT SUGAR FREE 64)  60 mL Per Tube BID  . FLUoxetine  20 mg Per Tube Daily  . free water  100 mL Per Tube Q4H  . insulin aspart  0-20 Units Subcutaneous Q4H  . insulin aspart  6 Units Subcutaneous Q4H  . insulin detemir  40 Units Subcutaneous BID  . mouth rinse  15 mL Mouth Rinse 10 times per day  . pantoprazole sodium  40 mg Per Tube Q1200  . sodium chloride flush  10-40 mL Intracatheter Q12H  . warfarin  2.5 mg Oral ONCE-1800  . Warfarin - Pharmacist Dosing Inpatient   Does not apply q1800      Madelon Lips  06/05/2019, 11:12 AM

## 2019-06-05 NOTE — Progress Notes (Signed)
Patient placed back on rest mode from wean for the night.

## 2019-06-05 NOTE — Progress Notes (Signed)
NAME:  Carlos Keesling., MRN:  [id number], DOB:  [birth year], LOS: 68 ADMISSION DATE:  04/21/2019, CONSULTATION DATE:  04/19/2019 REFERRING MD:  Dr. Leonel Ramsay, CHIEF COMPLAINT:  Slurred speech  Brief History   34 yo male smoker found to have slurred speech and Rt sided weakness.  Admitted with left MCA CVA and multiple smaller embolic infarcts s/p tPA and thrombectomy in IR.  Additionally found to have left ventricular  thrombus.    Course complicated by septic shock r/t HCAP, AKI requiring CRRT, polymorphic VT and cardiogenic shock.  Intermittent pressor dependence, required tracheostomy  Past Medical History  Systolic CHF with non ischemic CM, Cocaine abuse, OSA, DM Hx of CHF (EF 15%)  Hx myocarditis Smoker 1/2 ppd   Significant Hospital Events   2/05 Admit, tPA, IR thrombectomy 2/6 100% on fvent at 2300  Overnight requiring increasing levophed and phenylephrine.    2/6: switched to levophed, epi, started antibiotics, started on CRRT.  2/9  developed wide-complex tachycardia and hypotension, started on amiodarone drip, increase Levophed drip 2/15 drop in platelets, heparin stopped, bival started 2/17 Hypotensive and back on Levophed 2/18 trach, lines changed 2/19 started milrinone , fever 103 2/20 Episode of wide-complex tachycardia yesterday, changed from Levophed to vasopressin 2/22 stopping vanc. Still on inotrope support. Some AF w/ RVR. Had to be placed back on pressors. ivabradine added 2/23 still on pressors/ CRRT continued 2/24 tmax 98.4, Remains on CRRT, even UF, remains anuric, Levophed at 14 mcg/min, Milrinone at 0.125 mcg/kg/min, Coox 63.4 Doing well this morning on ATC, no events overnight.  Midodrine added   2/25 more interactive, remains on ATC, tmax 99.5/ WBC 21.4, ~900 ml liquid stool/ 24 hours, neg Cdiff, Levophed up to 20 mcg/min, CVP 2, ,milrinone remains at 0.125 mcg/kg/min, coox 92.7, Off CRRT since last night ~2000 s/p clotted x 3 off citrate,  restarted  3/1 Amio drip restarted for WCT/atrial flutter, milrinone turned off 3/2 CRRT stopped, permacath placed 3/5 crrt resumed 3/6 started on pressors for crrt tolerance 3/8 No longer on CRRT or pressor support  3/12 Tolerated PS for >2 hours  3/17  febrile 106 overnight, shock on pressors, new lines placed, broad-spectrum antibiotics  Consults:  Neuro IR Cardiology  Nephrology Heart Failure  EP   Procedures:  2/5-2/6:  TPA given at 428 am (total of 90 Mg) 520 am went to IR  S/P Lt common carotid arteriogram followed by complete revascularization of occluded LT MCA sup division mid M2 seg with x 1 pass with 54mx 40 mm solitaire X ret river device and penumbra aspiration with TICI 3 revascularization  ETT 2/05 >> 2/18 [facility] CVL 2/5 >>2/18  RT [place] CVL 2/18 >>3/2 RIJ HD cath 2/6 >>2/18 6 shiley cuffed trach 2/18  >>3/4  RT fem HD 2/19 >>3/2 RIJ permacath 3/2>> 3/4 shiley 4 uncuffed.... dislodged placed back 6cuffed shiley that evening 3/5: change to #6 cuffed distal XLT  3/17 right femoral CVL >> 3/17 right femoral arterial line >>  Significant Diagnostic Tests:  CT angio head/neck 2/05 >> occlusion of Lt MCA bifurcation Echo 2/05 >> EF less than 20%, cannot rule out apical thrombus MRI brain 2/11 > extensive acute infarction of multiple areas without large or medium vessel occlusion Echocardiogram 2/8 Left ventricular ejection fraction, by estimation, is <20%. The left  ventricle has severely decreased function. The left ventricle demonstrates  global hypokinesis. The left ventricular internal cavity size was severely  dilated. Possible small 0.8 x 0.6 cm apical thrombus.  3/16 duplex upper and lower extremities >>neg  3/17 head CT >.  New area of hypoattenuation in the left pons , left frontal encephalomalacia related to old infarct  Micro Data:  SARS CoV2 PCR 2/05 >> negative Influenza PCR 2/05 >> negative Urine 2/6 >> ng resp 2/6 >> nml flora BC 2/6  >>ng resp 2/16 >> ng [place] 2/19 >> negative  BC x 2 2/25 >>ng Cdiff 2/25 >> neg Trach asp 3/11>> Few candida albicans BC 3/11>> 1 of 4 with CoNS BC 3/12 >>ng  BC x 2 3/17 >>>   Antimicrobials:  mero 2/6 -2/12 Zosyn 2/6 , 2/19 >> 2/23 vanc 2/6 >> 2/9 , 2/19 >>2/22 Ceftriaxone 3/12> 3/16 3/17 vanc >> 3/19 3/17 Anidulafungin >> 3/19 3/17 cefepime >>    Scheduled Meds: . amiodarone  200 mg Per Tube BID  . ARIPiprazole  10 mg Per Tube Daily  . aspirin  81 mg Per Tube Daily  . atorvastatin  40 mg Per Tube q1800  . atropine  0.5 mg Intravenous Once  . chlorhexidine gluconate (MEDLINE KIT)  15 mL Mouth Rinse BID  . Chlorhexidine Gluconate Cloth  6 each Topical Daily  . darbepoetin (ARANESP) injection - NON-DIALYSIS  100 mcg Subcutaneous Q Mon-1800  . feeding supplement (PRO-STAT SUGAR FREE 64)  60 mL Per Tube BID  . FLUoxetine  20 mg Per Tube Daily  . free water  100 mL Per Tube Q4H  . insulin aspart  0-20 Units Subcutaneous Q4H  . insulin aspart  6 Units Subcutaneous Q4H  . insulin detemir  40 Units Subcutaneous BID  . mouth rinse  15 mL Mouth Rinse 10 times per day  . pantoprazole sodium  40 mg Per Tube Q1200  . sodium chloride flush  10-40 mL Intracatheter Q12H  . Warfarin - Pharmacist Dosing Inpatient   Does not apply q1800   Continuous Infusions: . sodium chloride 5 mL/hr at 06/05/19 0800  . sodium chloride Stopped (05/21/19 0940)  . anticoagulant sodium citrate 5 mL (06/01/19 0331)  . ceFEPime (MAXIPIME) IV Stopped (06/04/19 2256)  . dexmedetomidine (PRECEDEX) IV infusion 0.8 mcg/kg/hr (06/05/19 0800)  . feeding supplement (VITAL 1.5 CAL) 20 mL/hr at 06/04/19 1200  . norepinephrine (LEVOPHED) Adult infusion 6 mcg/min (06/05/19 0800)   PRN Meds:.sodium chloride, anticoagulant sodium citrate, bisacodyl, docusate, fentaNYL (SUBLIMAZE) injection, lip balm, loperamide HCl, midazolam, ondansetron (ZOFRAN) IV, sodium chloride flush, zolpidem   Interim history/subjective:    still on low dose levophed/ on precedex wean mode tolerating well   Objective   Blood pressure 119/80, pulse 65, temperature 97.9 F (36.6 C), temperature source Bladder, resp. rate 18, height 6' 1"  (1.854 m), weight 108.5 kg, SpO2 100 %.    Vent Mode: CPAP;PSV FiO2 (%):  [40 %] 40 % Set Rate:  [20 bmp] 20 bmp PEEP:  [5 cmH20] 5 cmH20 Pressure Support:  [12 cmH20-15 cmH20] 12 cmH20 Plateau Pressure:  [17 cmH20-22 cmH20] 17 cmH20   Intake/Output Summary (Last 24 hours) at 06/05/2019 0931 Last data filed at 06/05/2019 0800 Gross per 24 hour  Intake 1961.75 ml  Output 590 ml  Net 1371.75 ml   Filed Weights   06/01/19 0730 06/03/19 1226 06/03/19 1700  Weight: 104.2 kg 108.7 kg 108.5 kg     Physical Exam:  Tmax 101.8  Pt opens eyes and tracks some but not f/c  No jvd/ NT in place Oropharynx clear,  mucosa nl Neck supple  Lungs with minimal rhonchi bilaterally RRR no s3 or or  sign murmur Abd obese/ soft    Extr warm with no edema or clubbing noted        CBC Latest Ref Rng & Units 06/05/2019 06/04/2019 06/03/2019  WBC 4.0 - 10.5 K/uL 9.6 11.3(H) -  Hemoglobin 13.0 - 17.0 g/dL 8.3(L) 8.0(L) 9.5(L)  Hematocrit 39.0 - 52.0 % 27.7(L) 26.4(L) 28.0(L)  Platelets 150 - 400 K/uL 204 211 -   CMP Latest Ref Rng & Units 06/05/2019 06/04/2019 06/03/2019  Glucose 70 - 99 mg/dL 137(H) 98 -  BUN 6 - 20 mg/dL 66(H) 46(H) -  Creatinine 0.61 - 1.24 mg/dL 4.95(H) 4.24(H) -  Sodium 135 - 145 mmol/L 137 137 138  Potassium 3.5 - 5.1 mmol/L 3.3(L) 3.0(L) 2.8(L)  Chloride 98 - 111 mmol/L 102 97(L) -  CO2 22 - 32 mmol/L 18(L) 22 -  Calcium 8.9 - 10.3 mg/dL 8.8(L) 8.7(L) -  Total Protein 6.5 - 8.1 g/dL 6.6 6.9 -  Total Bilirubin 0.3 - 1.2 mg/dL 1.0 1.3(H) -  Alkaline Phos 38 - 126 U/L 163(H) 178(H) -  AST 15 - 41 U/L 31 37 -  ALT 0 - 44 U/L 30 34 -    Resolved Hospital Problem list   HCAP 0/62, Acute metabolic encephalopathy 2nd to hypoxia and renal failure, Cardiogenic shock  Assessment &  Plan:   Septic shock, new 3/17 - remains on low dose dopamine with plan to wean off, esp since causes ectopy  >  Neo would be the least likely pressor to cause ectopy if needed  Hyperthermia 3/17 Fever, leukocytosis secondary suspected aspiration/CAP with LLL infiltrate. Suspect that he is having episodes of aspiration to cause fevers and leukocytosis.  >>>  3/21 temp curve / wbc trending down on just maxipime but still levophed dependent/ weaning as tol and awaiting final culture results   Acute toxic metabolic encephalopathy/[place] encephalopathy Hyperactive delirium  Plan Continuing  aripiprazole 10 mg (ambilify) (against seroquel due to drug interactions).  Prozac 20 mg daily Ambien 10 ineffective for insomnia, okay to use Versed as needed   Acute hypoxic, hypercapnic respiratory failure with compromised airway in setting of CVA. Possible recurrent aspiration Failure to wean from vent s/p tracheostomy.  Plan Cuffed #6 XLT trach in place, following with speech therapy Continue routine trach care PRN suctioning  >>> continue daily weaning as tolerated   Chronic systolic CHF, non-ischemic Polymorphic VT. Apical thrombus in left ventricle. HLD. Continue amiodarone, aspirin, Lipitor >>>  Try off dopamine 3/20 but still needing levophed 3/21 with euvolemic status  Lt MCA CVA likely embolic. Left pontine CVA, ?new noted on 3/17 Hx of cocaine abuse, depression. Plan Supportive rx     AKI from ATN in setting of hypoxia, cardiogenic shock. Off CVVH since 3/7 and now on HD m-w-f Plan HD with K 4 bath to keep K up / in meantime caution with supplement given next HD not until 3/22 -  K 3.3 on 3/21 so give another 20 meq/ no arrythmia issues yet on levophed instead of dopa  HIT. Anemia of critical illness. Elevated INR ?hepatic congestion - heparin induced Plt Ab 1.692 from 05/02/19 - SRA positive, 90% from 05/03/19   Lab Results  Component Value Date   HGB 8.3 (L) 06/05/2019    HGB 8.0 (L) 06/04/2019   HGB 9.5 (L) 06/03/2019    Lab Results  Component Value Date   PLT 204 06/05/2019   PLT 211 06/04/2019   PLT 225 06/03/2019    Lab Results  Component Value Date  INR 2.6 (H) 06/05/2019   INR 3.9 (H) 06/04/2019   INR 4.7 (HH) 06/03/2019    Plan Continue warfarin.  INR therapeutic Follow CBC Conservative transfusion goal hemoglobin 7     DM type II poorly controlled with hyperglycemia. Plan levemir 40 units bid  SSI per protocol       Best practice:  Diet: NPO,  TFs DVT prophylaxis: SCDs/coumadin   GI prophylaxis: protonix Mobility: progress when able Code Status: full code Disposition: ICU Family: mother  Pending     The patient is critically ill with multiple organ systems failure and requires high complexity decision making for assessment and support, frequent evaluation and titration of therapies, application of advanced monitoring technologies and extensive interpretation of multiple databases. Critical Care Time devoted to patient care services described in this note is 35  minutes.    Christinia Gully, MD Pulmonary and [place] [phone] After 6:00 PM or weekends, use Beeper [phone]  After 7:00 pm call Elink  [phone]

## 2019-06-05 NOTE — Progress Notes (Signed)
ANTICOAGULATION/ANTIBIOTIC CONSULT NOTE  Pharmacy Consult:  Bivalirudin >> Coumadin, Cefepime Indication: stroke 2/5, LV thrombus 2/8, HIT 2/15; sepsis  Allergies  Allergen Reactions  . Heparin Other (See Comments)    Heparin induced thrombocytopenia. 2/15 HIT OD 1.692. 2/16 SRA positive-90.     Patient Measurements: Height: 6\' 1"  (185.4 cm) Weight: 239 lb 3.2 oz (108.5 kg) IBW/kg (Calculated) : 79.9   Vital Signs: Temp: 97.9 F (36.6 C) (03/21 0800) Temp Source: Bladder (03/21 0800) BP: 119/80 (03/21 0800) Pulse Rate: 65 (03/21 0800)  Labs: Recent Labs    06/03/19 0443 06/03/19 1040 06/03/19 1133 06/03/19 1734 06/03/19 1734 06/03/19 2231 06/03/19 2242 06/03/19 2242 06/04/19 0500 06/04/19 0628 06/05/19 0616  HGB 8.2*   < >   < >  --   --   --  9.5*   < > 8.0*  --  8.3*  HCT 27.7*   < >   < >  --   --   --  28.0*  --  26.4*  --  27.7*  PLT 225  --   --   --   --   --   --   --  211  --  204  LABPROT 39.0*   < >  --  44.5*  --   --   --   --   --  37.9* 27.4*  INR 4.0*   < >  --  4.7*  --   --   --   --   --  3.9* 2.6*  CREATININE 7.96*  --   --  2.82*   < > 3.78*  --   --  4.24*  --  4.95*   < > = values in this interval not displayed.    Estimated Creatinine Clearance: 26.9 mL/min (A) (by C-G formula based on SCr of 4.95 mg/dL (H)).  Assessment: 34 yr old male with history of ESRD on HD presented with left MCA infarct - received TPA and revascularization with IR on 2/5. On 2/8 found to have small LV apical thrombus on ECHO. Pharmacy consulted to dose IV heparin, which was switched to bivalirudin 2/15 for HIT. 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 SRA very positive at 26. Heparin allergy has appropriately been added to chart and updated with SRA results. Transitioned off bivalrudin on 3/14. Now on warfarin alone.  INR back to therapeutic range today at 2.6. Will resume warfarin dosing today cautiously.  Hgb stable at 8.3 this  AM-anemia of ESRD received Aranesp 3/15 with HD. No bleeding reported.   INR elevation may have been caused by drug interaction with amiodarone or combination of this, tube feeds being held and restarted at lower rate (not at goal), and false elevation due to hemodialysis. Patient also has a hospital history of shock liver and has recently required vasopressors for shock state. LFTs WNL this AM.   Source of shock may be sepsis for which patient has been receiving multiple antibiotics. This is day #10 of total antibiotics. On 3/17, patient was switched from ceftriaxone (originally on for aspiration PNA) to vancomycin, Eraxis, and cefepime. Vancomycin and Eraxis were discontinued 3/20, but pharmacy was re-consulted for cefepime dosing. WBC WNL, fevers resolving. All recent cultures negative, MRSA PCR on 3/10 positive.  Antibiotics CTX 3/12 >> 3/17 Vanc 3/17 >> 3/20 Eraxis 3/17 >> 3/20 Cefepime 3/17 >>  Goal of Therapy:  INR 2-3 Monitor platelets by anticoagulation protocol: Yes   Plan:  -  Give warfarin 2.5 mg PO x1 this evening - Monitor daily INR, DDI with amiodarone, s/sx of bleeding - Continue cefepime 1g IV q24hr - F/u HD, LOT goal, cultures and sensitivities   [name], PharmD PGY1 Pharmacy Resident

## 2019-06-06 ENCOUNTER — Inpatient Hospital Stay: Payer: Self-pay

## 2019-06-06 DIAGNOSIS — R579 Shock, unspecified: Secondary | ICD-10-CM | POA: Diagnosis not present

## 2019-06-06 DIAGNOSIS — J9601 Acute respiratory failure with hypoxia: Secondary | ICD-10-CM | POA: Diagnosis not present

## 2019-06-06 DIAGNOSIS — N179 Acute kidney failure, unspecified: Secondary | ICD-10-CM | POA: Diagnosis not present

## 2019-06-06 DIAGNOSIS — I63312 Cerebral infarction due to thrombosis of left middle cerebral artery: Secondary | ICD-10-CM | POA: Diagnosis not present

## 2019-06-06 DIAGNOSIS — R570 Cardiogenic shock: Secondary | ICD-10-CM | POA: Diagnosis not present

## 2019-06-06 LAB — COMPREHENSIVE METABOLIC PANEL
ALT: 28 U/L (ref 0–44)
AST: 30 U/L (ref 15–41)
Albumin: 2.4 g/dL — ABNORMAL LOW (ref 3.5–5.0)
Alkaline Phosphatase: 145 U/L — ABNORMAL HIGH (ref 38–126)
Anion gap: 18 — ABNORMAL HIGH (ref 5–15)
BUN: 86 mg/dL — ABNORMAL HIGH (ref 6–20)
CO2: 18 mmol/L — ABNORMAL LOW (ref 22–32)
Calcium: 9.1 mg/dL (ref 8.9–10.3)
Chloride: 104 mmol/L (ref 98–111)
Creatinine, Ser: 4.26 mg/dL — ABNORMAL HIGH (ref 0.61–1.24)
GFR calc Af Amer: 20 mL/min — ABNORMAL LOW (ref >60)
GFR calc non Af Amer: 17 mL/min — ABNORMAL LOW (ref >60)
Glucose, Bld: 111 mg/dL — ABNORMAL HIGH (ref 70–99)
Potassium: 3.3 mmol/L — ABNORMAL LOW (ref 3.5–5.1)
Sodium: 140 mmol/L (ref 135–145)
Total Bilirubin: 1 mg/dL (ref 0.3–1.2)
Total Protein: 6.5 g/dL (ref 6.5–8.1)

## 2019-06-06 LAB — COOXEMETRY PANEL
Carboxyhemoglobin: 2.7 % — ABNORMAL HIGH (ref 0.5–1.5)
Methemoglobin: 0 % (ref 0.0–1.5)
O2 Saturation: 29.1 %
Total hemoglobin: 8.2 g/dL — ABNORMAL LOW (ref 12.0–16.0)

## 2019-06-06 LAB — CULTURE, BLOOD (ROUTINE X 2)
Culture: NO GROWTH
Culture: NO GROWTH
Special Requests: ADEQUATE
Special Requests: ADEQUATE

## 2019-06-06 LAB — GLUCOSE, CAPILLARY
Glucose-Capillary: 122 mg/dL — ABNORMAL HIGH (ref 70–99)
Glucose-Capillary: 147 mg/dL — ABNORMAL HIGH (ref 70–99)
Glucose-Capillary: 148 mg/dL — ABNORMAL HIGH (ref 70–99)
Glucose-Capillary: 80 mg/dL (ref 70–99)
Glucose-Capillary: 85 mg/dL (ref 70–99)
Glucose-Capillary: 96 mg/dL (ref 70–99)

## 2019-06-06 LAB — CBC
HCT: 26.6 % — ABNORMAL LOW (ref 39.0–52.0)
Hemoglobin: 8 g/dL — ABNORMAL LOW (ref 13.0–17.0)
MCH: 32.1 pg (ref 26.0–34.0)
MCHC: 30.1 g/dL (ref 30.0–36.0)
MCV: 106.8 fL — ABNORMAL HIGH (ref 80.0–100.0)
Platelets: 225 10*3/uL (ref 150–400)
RBC: 2.49 MIL/uL — ABNORMAL LOW (ref 4.22–5.81)
RDW: 21.6 % — ABNORMAL HIGH (ref 11.5–15.5)
WBC: 8.4 10*3/uL (ref 4.0–10.5)
nRBC: 0.4 % — ABNORMAL HIGH (ref 0.0–0.2)

## 2019-06-06 LAB — PROTIME-INR
INR: 2.1 — ABNORMAL HIGH (ref 0.8–1.2)
Prothrombin Time: 23.8 seconds — ABNORMAL HIGH (ref 11.4–15.2)

## 2019-06-06 MED ORDER — MIDODRINE HCL 5 MG PO TABS
5.0000 mg | ORAL_TABLET | Freq: Three times a day (TID) | ORAL | Status: DC
  Administered 2019-06-06 – 2019-06-19 (×34): 5 mg
  Filled 2019-06-06 (×33): qty 1

## 2019-06-06 MED ORDER — WARFARIN - PHARMACIST DOSING INPATIENT: Freq: Every day | Status: DC

## 2019-06-06 MED ORDER — DARBEPOETIN ALFA 150 MCG/0.3ML IJ SOSY
150.0000 ug | PREFILLED_SYRINGE | INTRAMUSCULAR | Status: DC
  Administered 2019-06-06 – 2019-06-13 (×2): 150 ug via SUBCUTANEOUS
  Filled 2019-06-06 (×2): qty 0.3

## 2019-06-06 MED ORDER — FUROSEMIDE 10 MG/ML IJ SOLN: 120.0000 mg | Freq: Once | INTRAVENOUS | Status: DC

## 2019-06-06 MED ORDER — WARFARIN SODIUM 2.5 MG PO TABS
2.5000 mg | ORAL_TABLET | Freq: Once | ORAL | Status: AC
  Administered 2019-06-06: 2.5 mg via ORAL
  Filled 2019-06-06: qty 1

## 2019-06-06 NOTE — Progress Notes (Signed)
[facility] Progress Note    Assessment/ Plan:   1.  AKIpresumably due to ATN with ischemic/nephrotoxic insults worsened by cardiogenic shock. Started on CRRT 04/23/19-  3/7.   Tolerated IHD on 3/10 and 3/12 - removed 3 liters.  will keep on MWF schedule- s/p 3/15.  Now that he has tolerated IHD- the issue is with trach will need LTACH.  Have not done perm access due to poor prognosis.  HD 3/17.  Had hyperpyrexia: blood cultures- vanc/ cefepime/ eraxis started 3/17.  S/p IHD 3/19  ? If having some element of renal recovery given increased UOP, will plan for HD tomorrow 3/23.  No heparin.     2.Acute stroke- presumed embolic with bilateral infarcts and LV thrombus- received tpa on admission  3. SVT/polymorphic VT- per Cardiology, using 4K today for dialysis  4. Acute hypoxic respiratory failure- s/p trach--> trying to wean  5. 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 HIT Ab+, s/p bivalirudin and now on warfarin alone  6. HTN/vol: on and off pressors, on midodrine too; net even UF today.    7. Hypernatremia: resvolved have added a little free water in the tube but not much  8. Anemia-   iron stores low, got feraheme on 3/9 and 3/12,  - also  giving ESA - darbe 100 per week > increase dose for 3/22 to 150 given Hb in 8s.   9. Bones-  Phos was ok then  9.6 on 3/17 -  No binder yet, check tomorrow and add binder if still up.   10. Shock: on vanc/ cefepime/ eraxis now-- back on low dose pressors  11. Disposition- overall prognosis is poor with ongoing RRT , trach, as well as cardiogenic shock/cardiomyopathy.  LTACH seems the most likely option  Subjective:    UOP increasing--> had 550 mL yesterday Tm 100.4 overnight 8pm  Objective:   BP 110/62   Pulse 67   Temp 99.5 F (37.5 C)   Resp 20   Ht 6' 1"  (1.854 m)   Wt 108.5 kg   SpO2 100%   BMI 31.56 kg/m   Intake/Output Summary (Last 24 hours) at 06/06/2019 0759 Last data filed at 06/06/2019 0700 Gross per 24  hour  Intake 1737.98 ml  Output 725 ml  Net 1012.98 ml   Weight change:   Physical Exam: Gen: sleeping, vented HEENT - trach on vent CVS: S1S2 no rub Resp:reduced breath sounds Abd: soft/ND Ext:  no edema Access: rightIJtunneled catheter  Imaging: No results found.  Labs: BMET Recent Labs  Lab 05/31/19 0612 06/01/19 0346 06/01/19 0754 06/01/19 0754 06/02/19 0444 06/02/19 0444 06/03/19 0443 06/03/19 0443 06/03/19 1133 06/03/19 1734 06/03/19 2231 06/03/19 2242 06/04/19 0500 06/05/19 0616 06/06/19 0500  NA  --    < > 145   < > 141   < > 141   < > 139 136 137 138 137 137 140  K  --    < > 4.6   < > 3.7   < > 4.8   < > 4.3 2.8* 2.9* 2.8* 3.0* 3.3* 3.3*  CL  --   --  99   < > 99  --  100  --   --  96* 99  --  97* 102 104  CO2  --   --  17*   < > 19*  --  12*  --   --  22 21*  --  22 18* 18*  GLUCOSE  --   --  441*   < > 78  --  154*  --   --  113* 165*  --  98 137* 111*  BUN  --   --  145*   < > 70*  --  99*  --   --  28* 38*  --  46* 66* 86*  CREATININE  --   --  10.51*   < > 6.27*  --  7.96*  --   --  2.82* 3.78*  --  4.24* 4.95* 4.26*  CALCIUM  --   --  8.7*   < > 9.0  --  9.1  --   --  8.9 8.6*  --  8.7* 8.8* 9.1  PHOS 5.9*  --  9.6*  --   --   --   --   --   --   --   --   --   --   --   --    < > = values in this interval not displayed.   CBC Recent Labs  Lab 06/03/19 0443 06/03/19 1133 06/03/19 2242 06/04/19 0500 06/05/19 0616 06/06/19 0500  WBC 17.2*  --   --  11.3* 9.6 8.4  HGB 8.2*   < > 9.5* 8.0* 8.3* 8.0*  HCT 27.7*   < > 28.0* 26.4* 27.7* 26.6*  MCV 108.6*  --   --  104.3* 107.8* 106.8*  PLT 225  --   --  211 204 225   < > = values in this interval not displayed.    Medications:    . amiodarone  200 mg Per Tube BID  . ARIPiprazole  10 mg Per Tube Daily  . aspirin  81 mg Per Tube Daily  . atorvastatin  40 mg Per Tube q1800  . atropine  0.5 mg Intravenous Once  . chlorhexidine gluconate (MEDLINE KIT)  15 mL Mouth Rinse BID  .  Chlorhexidine Gluconate Cloth  6 each Topical Daily  . darbepoetin (ARANESP) injection - NON-DIALYSIS  100 mcg Subcutaneous Q Mon-1800  . feeding supplement (PRO-STAT SUGAR FREE 64)  60 mL Per Tube BID  . FLUoxetine  20 mg Per Tube Daily  . free water  100 mL Per Tube Q4H  . insulin aspart  0-20 Units Subcutaneous Q4H  . insulin aspart  6 Units Subcutaneous Q4H  . insulin detemir  40 Units Subcutaneous BID  . mouth rinse  15 mL Mouth Rinse 10 times per day  . pantoprazole sodium  40 mg Per Tube Q1200  . sodium chloride flush  10-40 mL Intracatheter Q12H  . Warfarin - Pharmacist Dosing Inpatient   Does not apply q1800      Justin Mend  06/06/2019, 7:59 AM

## 2019-06-06 NOTE — Progress Notes (Signed)
NAME:  Carlos Cham., MRN:  [id number], DOB:  08-02-1985, LOS: 34 ADMISSION DATE:  04/18/2019, CONSULTATION DATE:  05/03/2019 REFERRING MD:  Dr. Leonel Ramsay, CHIEF COMPLAINT:  Slurred speech  Brief History   34 yo male smoker found to have slurred speech and Rt sided weakness.  Admitted with left MCA CVA and multiple smaller embolic infarcts s/p tPA and thrombectomy in IR.  Additionally found to have left ventricular  thrombus.    Course complicated by septic shock r/t HCAP, AKI requiring CRRT, polymorphic VT and cardiogenic shock.  Intermittent pressor dependence, required tracheostomy  Past Medical History  Systolic CHF with non ischemic CM, Cocaine abuse, OSA, DM Hx of CHF (EF 15%)  Hx myocarditis Smoker 1/2 ppd   Significant Hospital Events   2/05 Admit, tPA, IR thrombectomy 2/6 100% on fvent at 2300  Overnight requiring increasing levophed and phenylephrine.    2/6: switched to levophed, epi, started antibiotics, started on CRRT.  2/9  developed wide-complex tachycardia and hypotension, started on amiodarone drip, increase Levophed drip 2/15 drop in platelets, heparin stopped, bival started 2/17 Hypotensive and back on Levophed 2/18 trach, lines changed 2/19 started milrinone , fever 103 2/20 Episode of wide-complex tachycardia yesterday, changed from Levophed to vasopressin 2/22 stopping vanc. Still on inotrope support. Some AF w/ RVR. Had to be placed back on pressors. ivabradine added 2/23 still on pressors/ CRRT continued 2/24 tmax 98.4, Remains on CRRT, even UF, remains anuric, Levophed at 14 mcg/min, Milrinone at 0.125 mcg/kg/min, Coox 63.4 Doing well this morning on ATC, no events overnight.  Midodrine added   2/25 more interactive, remains on ATC, tmax 99.5/ WBC 21.4, ~900 ml liquid stool/ 24 hours, neg Cdiff, Levophed up to 20 mcg/min, CVP 2, ,milrinone remains at 0.125 mcg/kg/min, coox 92.7, Off CRRT since last night ~2000 s/p clotted x 3 off citrate,  restarted  3/1 Amio drip restarted for WCT/atrial flutter, milrinone turned off 3/2 CRRT stopped, permacath placed 3/5 crrt resumed 3/6 started on pressors for crrt tolerance 3/8 No longer on CRRT or pressor support  3/12 Tolerated PS for >2 hours  3/17  febrile 106 overnight, shock on pressors, new lines placed, broad-spectrum antibiotics 3/22: Awake, appears comfortable on pressure support ventilation.  Currently weaning Precedex, he is back on low-dose norepinephrine Consults:  Neuro IR Cardiology  Nephrology Heart Failure  EP   Procedures:  2/5-2/6:  TPA given at 428 am (total of 90 Mg) 520 am went to IR  S/P Lt common carotid arteriogram followed by complete revascularization of occluded LT MCA sup division mid M2 seg with x 1 pass with 14mx 40 mm solitaire X ret river device and penumbra aspiration with TICI 3 revascularization  ETT 2/05 >> 2/18 LIJ CVL 2/5 >>2/18  RT Chase CVL 2/18 >>3/2 RIJ HD cath 2/6 >>2/18 6 shiley cuffed trach 2/18  >>3/4  RT fem HD 2/19 >>3/2 RIJ permacath 3/2>> 3/4 shiley 4 uncuffed.... dislodged placed back 6cuffed shiley that evening 3/5: change to #6 cuffed distal XLT  3/17 right femoral CVL >> 3/17 right femoral arterial line >>  Significant Diagnostic Tests:  CT angio head/neck 2/05 >> occlusion of Lt MCA bifurcation Echo 2/05 >> EF less than 20%, cannot rule out apical thrombus MRI brain 2/11 > extensive acute infarction of multiple areas without large or medium vessel occlusion Echocardiogram 2/8 Left ventricular ejection fraction, by estimation, is <20%. The left  ventricle has severely decreased function. The left ventricle demonstrates  global hypokinesis. The  left ventricular internal cavity size was severely  dilated. Possible small 0.8 x 0.6 cm apical thrombus.   3/16 duplex upper and lower extremities >>neg  3/17 head CT >.  New area of hypoattenuation in the left pons , left frontal encephalomalacia related to old  infarct  Micro Data:  SARS CoV2 PCR 2/05 >> negative Influenza PCR 2/05 >> negative Urine 2/6 >> ng resp 2/6 >> nml flora BC 2/6 >>ng resp 2/16 >> ng [facility] 2/19 >> negative  BC x 2 2/25 >>ng Cdiff 2/25 >> neg Trach asp 3/11>> Few candida albicans BC 3/11>> 1 of 4 with CoNS BC 3/12 >>ng  BC x 2 3/17 >>>   Antimicrobials:  mero 2/6 -2/12 Zosyn 2/6 , 2/19 >> 2/23 vanc 2/6 >> 2/9 , 2/19 >>2/22 Ceftriaxone 3/12> 3/16 3/17 vanc >> 3/19 3/17 Anidulafungin >> 3/19 3/17 cefepime >>    Scheduled Meds: . amiodarone  200 mg Per Tube BID  . ARIPiprazole  10 mg Per Tube Daily  . aspirin  81 mg Per Tube Daily  . atorvastatin  40 mg Per Tube q1800  . atropine  0.5 mg Intravenous Once  . chlorhexidine gluconate (MEDLINE KIT)  15 mL Mouth Rinse BID  . Chlorhexidine Gluconate Cloth  6 each Topical Daily  . darbepoetin (ARANESP) injection - NON-DIALYSIS  150 mcg Subcutaneous Q Mon-1800  . feeding supplement (PRO-STAT SUGAR FREE 64)  60 mL Per Tube BID  . FLUoxetine  20 mg Per Tube Daily  . free water  100 mL Per Tube Q4H  . insulin aspart  0-20 Units Subcutaneous Q4H  . insulin aspart  6 Units Subcutaneous Q4H  . insulin detemir  40 Units Subcutaneous BID  . mouth rinse  15 mL Mouth Rinse 10 times per day  . pantoprazole sodium  40 mg Per Tube Q1200  . sodium chloride flush  10-40 mL Intracatheter Q12H  . Warfarin - Pharmacist Dosing Inpatient   Does not apply q1600   Continuous Infusions: . sodium chloride 5 mL/hr at 06/06/19 0700  . sodium chloride Stopped (05/21/19 0940)  . anticoagulant sodium citrate 5 mL (06/01/19 0331)  . ceFEPime (MAXIPIME) IV Stopped (06/05/19 2152)  . dexmedetomidine (PRECEDEX) IV infusion 0.4 mcg/kg/hr (06/06/19 0757)  . feeding supplement (VITAL 1.5 CAL) 20 mL/hr at 06/05/19 1800  . norepinephrine (LEVOPHED) Adult infusion 5 mcg/min (06/06/19 0700)   PRN Meds:.sodium chloride, anticoagulant sodium citrate, bisacodyl, docusate, fentaNYL (SUBLIMAZE)  injection, lip balm, loperamide HCl, midazolam, ondansetron (ZOFRAN) IV, sodium chloride flush, zolpidem   Interim history/subjective:  T-max is 99.5 no distress no events overnight  Objective   Blood pressure 110/62, pulse 67, temperature 99.5 F (37.5 C), resp. rate 20, height 6' 1"  (1.854 m), weight 108.5 kg, SpO2 100 %.    Vent Mode: CPAP;PSV FiO2 (%):  [40 %] 40 % Set Rate:  [20 bmp] 20 bmp PEEP:  [5 cmH20] 5 cmH20 Pressure Support:  [10 WNU27-25 cmH20] 10 cmH20 Plateau Pressure:  [18 cmH20-23 cmH20] 20 cmH20   Intake/Output Summary (Last 24 hours) at 06/06/2019 0944 Last data filed at 06/06/2019 0700 Gross per 24 hour  Intake 1635.8 ml  Output 650 ml  Net 985.8 ml   Filed Weights   06/01/19 0730 06/03/19 1226 06/03/19 1700  Weight: 104.2 kg 108.7 kg 108.5 kg     Physical Exam: General 34 year old male resting comfortably in bed currently on pressure support ventilation HEENT normocephalic atraumatic tracheostomy unremarkable Pulmonary scattered rhonchi no accessory use currently on pressure  support of 10 FiO2 40% acceptable minute ventilation Cardiac regular rate and rhythm without murmur rub or gallop Abdomen soft nontender Extremities are warm with brisk capillary refill Neuro awake, follows commands, moves all extremities    Resolved Hospital Problem list   HCAP 2/82, Acute metabolic encephalopathy 2nd to hypoxia and renal failure, Cardiogenic shock  Assessment & Plan:    Circulatory shock, this is been recurrent in nature.  Has underlying cardiomyopathy but more recently complicated by recurrent sepsis in the setting of what was felt to be aspiration pneumonia Plan Continue to trend fever and white blood cell curve Continue to titrate norepinephrine Adding back midodrine Continue telemetry monitoring Keep euvolemic He is currently on day #7 of cefepime, will discontinue this after today's dose  Chronic systolic CHF, non-ischemic Polymorphic VT. Apical  thrombus in left ventricle. HLD. Plan Continuing aspirin, Lipitor, and amiodarone Continuing to try to wean norepinephrine, adding midodrine Continue telemetry monitoring  Acute toxic metabolic encephalopathy/Uremic encephalopathy Hyperactive delirium  Plan Continue Abilify, instead of Seroquel due to drug interactions, continue Prozac and Ambien    Acute hypoxic, hypercapnic respiratory failure with compromised airway in setting of CVA.  Complicated by recurrent aspiration and failure to wean from vent s/p tracheostomy.  -Unfortunately it seems as though his cardiomyopathy and renal failure also playing a role in liberating him from ventilator Plan Antibiotics to complete today Continue pressure support ventilation, and Daily assessment for aerosol trach collar Mobilization Keep euvolemic, avoid positive fluid balance VAP bundle   Lt MCA CVA likely embolic. Left pontine CVA, ?new noted on 3/17 Hx of cocaine abuse, depression. Plan Continuing supportive care   AKI from ATN in setting of hypoxia, cardiogenic shock. Off CVVH since 3/7 and now on HD m-w-f Plan Nephrology following, for dialysis today on 3/22  Fluid and electrolyte imbalance/acid-base imbalance: Hypokalemia, anion gap metabolic acidosis Plan Replace potassium For dialysis today 3/22 Daily chemistries  HIT: Platelets have now normalized Plan Continuing warfarin per pharmacy    Anemia of critical illness no evidence of bleeding Plan Trend CBC Transfuse for hemoglobin less than 7   DM type II poorly controlled with hyperglycemia. Plan Continue Levemir and sliding scale insulin     Best practice:  Diet: NPO,  TFs DVT prophylaxis: SCDs/coumadin   GI prophylaxis: protonix Mobility: progress when able Code Status: full code Disposition: ICU Family: mother  Pending    My cct 105 min   Erick Colace ACNP-BC [place] Pager # [phone] OR # [phone] if no answer

## 2019-06-06 NOTE — Progress Notes (Signed)
Patient ID: Carlos Brewer., male   DOB: [birth year], 34 y.o.   MRN: [id number]    Advanced Heart Failure Rounding Note   Subjective:    Remains on vent through trach. Has been getting intermittent HD, last on 3/19.   On 3/17, became febrile again and started on vancomycin/cefepime/Eraxis. Tm 100.8 last night.  Thought to be possible aspiration PNA.  Developed septic shock picture again.    Sitting up in bed working with PT. Will follow commands. On NE 2 (weaning)  Midodrine 5 tid restarted. Making some urine. HD pushed back until tomorrow.   Rhythm stable.    Objective:   Weight Range:  Vital Signs:   Temp:  [97.5 F (36.4 C)-100.8 F (38.2 C)] 99.5 F (37.5 C) (03/22 0700) Pulse Rate:  [65-74] 67 (03/22 0733) Resp:  [16-23] 20 (03/22 0733) BP: (83-115)/(61-85) 110/62 (03/22 0733) SpO2:  [93 %-100 %] 100 % (03/22 1134) Arterial Line BP: (103-133)/(61-79) 126/74 (03/22 0700) FiO2 (%):  [40 %] 40 % (03/22 1134) Last BM Date: 06/05/19  Weight change: Filed Weights   06/01/19 0730 06/03/19 1226 06/03/19 1700  Weight: 104.2 kg 108.7 kg 108.5 kg    Intake/Output:   Intake/Output Summary (Last 24 hours) at 06/06/2019 1501 Last data filed at 06/06/2019 1200 Gross per 24 hour  Intake 1360.76 ml  Output 685 ml  Net 675.76 ml    PHYSICAL EXAM: General:  Sitting up in bed. Working with PT Will follow commands HEENT: normal Neck: supple. no JVD. + trach Carotids 2+ bilat; no bruits. No lymphadenopathy or thryomegaly appreciated. Cor: PMI nondisplaced. Regular rate & rhythm. No rubs, gallops or murmurs. Lungs: clear no wheeze Abdomen: obese soft, nontender, nondistended. No hepatosplenomegaly. No bruits or masses. Good bowel sounds. Extremities: no cyanosis, clubbing, rash, edema Neuro: awake follows commands aphasic   Telemetry: Sinus 60-70s +occasional PVCs. Personally reviewed    Labs: Basic Metabolic Panel: Recent Labs  Lab 05/31/19 0612 06/01/19 0346  06/01/19 0754 06/02/19 0444 06/03/19 1734 06/03/19 1734 06/03/19 2231 06/03/19 2231 06/03/19 2242 06/04/19 0500 06/05/19 0616 06/06/19 0500  NA  --    < > 145   < > 136   < > 137  --  138 137 137 140  K  --    < > 4.6   < > 2.8*   < > 2.9*  --  2.8* 3.0* 3.3* 3.3*  CL  --   --  99   < > 96*  --  99  --   --  97* 102 104  CO2  --   --  17*   < > 22  --  21*  --   --  22 18* 18*  GLUCOSE  --   --  441*   < > 113*  --  165*  --   --  98 137* 111*  BUN  --   --  145*   < > 28*  --  38*  --   --  46* 66* 86*  CREATININE  --   --  10.51*   < > 2.82*  --  3.78*  --   --  4.24* 4.95* 4.26*  CALCIUM  --   --  8.7*   < > 8.9   < > 8.6*   < >  --  8.7* 8.8* 9.1  MG  --   --   --   --   --   --  1.8  --   --   --   --   --  PHOS 5.9*  --  9.6*  --   --   --   --   --   --   --   --   --    < > = values in this interval not displayed.    Liver Function Tests: Recent Labs  Lab 06/01/19 0754 06/03/19 1040 06/04/19 0500 06/05/19 0616 06/06/19 0500  AST  --  45* 37 31 30  ALT  --  41 34 30 28  ALKPHOS  --  161* 178* 163* 145*  BILITOT  --  1.1 1.3* 1.0 1.0  PROT  --  6.9 6.9 6.6 6.5  ALBUMIN 3.2* 3.0* 2.9* 2.6* 2.4*   No results for input(s): LIPASE, AMYLASE in the last 168 hours. No results for input(s): AMMONIA in the last 168 hours.  CBC: Recent Labs  Lab 06/02/19 0444 06/02/19 0444 06/03/19 0443 06/03/19 0443 06/03/19 1133 06/03/19 2242 06/04/19 0500 06/05/19 0616 06/06/19 0500  WBC 22.4*  --  17.2*  --   --   --  11.3* 9.6 8.4  HGB 9.0*   < > 8.2*   < > 8.5* 9.5* 8.0* 8.3* 8.0*  HCT 28.8*   < > 27.7*   < > 25.0* 28.0* 26.4* 27.7* 26.6*  MCV 103.6*  --  108.6*  --   --   --  104.3* 107.8* 106.8*  PLT 225  --  225  --   --   --  211 204 225   < > = values in this interval not displayed.    Cardiac Enzymes: No results for input(s): CKTOTAL, CKMB, CKMBINDEX, TROPONINI in the last 168 hours.  BNP: BNP (last 3 results) Recent Labs    12/13/18 1750 12/15/18 0405  04/23/19 0945  BNP 1,156.1* 1,161.0* 2,909.9*    ProBNP (last 3 results) No results for input(s): PROBNP in the last 8760 hours.    Other results:  Imaging: No results found.   Medications:     Scheduled Medications: . amiodarone  200 mg Per Tube BID  . ARIPiprazole  10 mg Per Tube Daily  . aspirin  81 mg Per Tube Daily  . atorvastatin  40 mg Per Tube q1800  . atropine  0.5 mg Intravenous Once  . chlorhexidine gluconate (MEDLINE KIT)  15 mL Mouth Rinse BID  . Chlorhexidine Gluconate Cloth  6 each Topical Daily  . darbepoetin (ARANESP) injection - NON-DIALYSIS  150 mcg Subcutaneous Q Mon-1800  . feeding supplement (PRO-STAT SUGAR FREE 64)  60 mL Per Tube BID  . FLUoxetine  20 mg Per Tube Daily  . free water  100 mL Per Tube Q4H  . insulin aspart  0-20 Units Subcutaneous Q4H  . insulin aspart  6 Units Subcutaneous Q4H  . insulin detemir  40 Units Subcutaneous BID  . mouth rinse  15 mL Mouth Rinse 10 times per day  . midodrine  5 mg Per Tube Q8H  . pantoprazole sodium  40 mg Per Tube Q1200  . sodium chloride flush  10-40 mL Intracatheter Q12H  . warfarin  2.5 mg Oral ONCE-1600  . Warfarin - Pharmacist Dosing Inpatient   Does not apply q1600    Infusions: . sodium chloride 5 mL/hr at 06/06/19 0700  . sodium chloride Stopped (05/21/19 0940)  . anticoagulant sodium citrate 5 mL (06/01/19 0331)  . ceFEPime (MAXIPIME) IV Stopped (06/05/19 2152)  . dexmedetomidine (PRECEDEX) IV infusion 0.4 mcg/kg/hr (06/06/19 0757)  . feeding supplement (VITAL 1.5 CAL) 20 mL/hr at 06/05/19  1800  . norepinephrine (LEVOPHED) Adult infusion 5 mcg/min (06/06/19 0700)    PRN Medications: sodium chloride, anticoagulant sodium citrate, bisacodyl, docusate, fentaNYL (SUBLIMAZE) injection, lip balm, loperamide HCl, midazolam, ondansetron (ZOFRAN) IV, sodium chloride flush, zolpidem   Assessment/Plan:   1. Shock - Recurrent septic shock, on broad spectrum abx.   - Beginning to stabilize. Now  on NE 2. Weaning. Ectopy much improved in switching from dopamine to NE. Midodrine added to facilitate pressor wean  - D/w CCM  2. Acute on chronic systolic HF due to NICM - Echo EF < 20% - On midodrine for BP support so unable to start HF meds - Volume status looks ok on exam, controlled by HD.  - co-ox this am 29%. Doubt this is accurate. Not sure where this was drawn from .  3. AKI -> ESRD - started on CRRT 04/23/19, now on iHD.   - Renal following plan iHD tomorrow - Making some urine Volume status looks ok.   4. Acute CVA with severe residual deficits due to extensive R cerebellar, midbrain, R MCA and L MCA infarcts  - likely cardioembolic - s/p clot extraction - remains aphasic. Working with PT  5. Acute hypoxic respiratory failure - s/p trach, has failed vent wean and will likely need LTACH.   6. Frequent PVCs/ventricular bigeminy/atrial tachycardia flutter - PVCs improved of dopamine - Keep K> 4.0 Mg > 2.0  7. Apical Thrombus - Warfarin. INR 2.1   8. ID - Suspect recurrent septic shock/infection noted 3/17.  Possible aspiration PNA.  - Broad spectrum antimicrobials per CCM.  - Remains febrile but hemodynamically improving   CRITICAL CARE Performed by: Glori Bickers  Total critical care time: 35 minutes  Critical care time was exclusive of separately billable procedures and treating other patients.  Critical care was necessary to treat or prevent imminent or life-threatening deterioration.  Critical care was time spent personally by me (independent of midlevel providers or residents) on the following activities: development of treatment plan with patient and/or surrogate as well as nursing, discussions with consultants, evaluation of patient's response to treatment, examination of patient, obtaining history from patient or surrogate, ordering and performing treatments and interventions, ordering and review of laboratory studies, ordering and review of radiographic  studies, pulse oximetry and re-evaluation of patient's condition.    Length of Stay: 45   Glori Bickers  MD 06/06/2019, 3:01 PM  Advanced Heart Failure Team Pager [phone] (M-F; 7a - 4p)  Please contact [facility] for night-coverage after hours (4p -7a ) and weekends on [email or web address]

## 2019-06-06 NOTE — Progress Notes (Signed)
ANTICOAGULATION/ANTIBIOTIC CONSULT NOTE  Pharmacy Consult:  Bivalirudin >> Coumadin, Cefepime Indication: stroke 2/5, LV thrombus 2/8, HIT 2/15; sepsis  Allergies  Allergen Reactions  . Heparin Other (See Comments)    Heparin induced thrombocytopenia. 2/15 HIT OD 1.692. 2/16 SRA positive-90.     Patient Measurements: Height: 6\' 1"  (185.4 cm) Weight: 239 lb 3.2 oz (108.5 kg) IBW/kg (Calculated) : 79.9   Vital Signs: Temp: 99.5 F (37.5 C) (03/22 0700) Temp Source: Bladder (03/22 0400) BP: 110/62 (03/22 0733) Pulse Rate: 67 (03/22 0733)  Labs: Recent Labs    06/03/19 1734 06/04/19 0500 06/04/19 0500 06/04/19 0628 06/05/19 0616 06/06/19 0500  HGB  --  8.0*   < >  --  8.3* 8.0*  HCT  --  26.4*  --   --  27.7* 26.6*  PLT  --  211  --   --  204 225  LABPROT   < >  --   --  37.9* 27.4* 23.8*  INR   < >  --   --  3.9* 2.6* 2.1*  CREATININE  --  4.24*  --   --  4.95* 4.26*   < > = values in this interval not displayed.    Estimated Creatinine Clearance: 31.3 mL/min (A) (by C-G formula based on SCr of 4.26 mg/dL (H)).  Assessment: 34 yr old male with history of ESRD on HD presented with left MCA infarct - received TPA and revascularization with IR on 2/5. On 2/8 found to have small LV apical thrombus on ECHO. Pharmacy consulted to dose IV heparin, which was switched to bivalirudin 2/15 for HIT. 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 SRA very positive at 7. Heparin allergy has appropriately been added to chart and updated with SRA results. Transitioned off bivalrudin on 3/14. Now on warfarin alone.  INR in therapeutic range today at 2.1. Warfarin dosing cautiously.  Hgb stable at 8.3 this AM-anemia of ESRD received Aranesp 3/15 with HD. No bleeding reported.   INR elevation may have been caused by drug interaction with amiodarone or combination of this, tube feeds being held and restarted at lower rate (not at goal), and false elevation due  to hemodialysis. Patient also has a hospital history of shock liver and has recently required vasopressors for shock state. LFTs WNL this AM.   Source of shock may be sepsis for which patient has been receiving multiple antibiotics. This is day #10 of total antibiotics. On 3/17, patient was switched from ceftriaxone (originally on for aspiration PNA) to vancomycin, Eraxis, and cefepime. Vancomycin and Eraxis were discontinued 3/20, but pharmacy was re-consulted for cefepime dosing. WBC WNL, fevers resolving. All recent cultures negative, MRSA PCR on 3/10 positive.  Antibiotics CTX 3/12 >> 3/17 Vanc 3/17 >> 3/20 Eraxis 3/17 >> 3/20 Cefepime 3/17 >>  Goal of Therapy:  INR 2-3 Monitor platelets by anticoagulation protocol: Yes   Plan:  - Give warfarin 2.5 mg PO x1 this evening - Monitor daily INR, DDI with amiodarone, s/sx of bleeding - Continue cefepime 1g IV q24hr - F/u HD, LOT goal, cultures and sensitivities  Alanda Slim, PharmD, [facility] Clinical Pharmacist Please see AMION for all Pharmacists' Contact Phone Numbers 06/06/2019, 10:42 AM

## 2019-06-06 NOTE — Progress Notes (Signed)
Physical Therapy Treatment Patient Details Name: Carlos Brewer. MRN: [id number] DOB: [birth year] Today's Date: 06/06/2019    History of Present Illness Pt is a 34 y/o male smoker who initially presented on 2/5 with slurred speech and Rt sided weakness. Admitted with left MCA CVA and multiple smaller embolic infarcts s/p tPA and thrombectomy in IR.  Additionally found to have left ventricular  thrombus. Hospital course complicated by septic shock r/t HCAP, AKI requiring CRRT, polymorphic VT and cardiogenic shock. Echo EF < 20%. Pt with Intermittent pressor dependence, required tracheostomy. Tolerating trach collar 3/4, back on vent 3/5 with flash pulmonary edema.  Started IHD 3/10.    PT Comments    Patient needing lots of encouragement for participation.  He seems lacking in motivation with prolonged illness with multiple set backs.   Encouraging him today with his mother present and able to sit up in chair position end of session.  Feel he will need longer term rehab due to medical issues.  PT to follow.  Updated goals this session.   Follow Up Recommendations  LTACH     Equipment Recommendations  Other (comment)(TBA)    Recommendations for Other Services       Precautions / Restrictions Precautions Precautions: Fall Precaution Comments: trach collar/flexiseal    Mobility  Bed Mobility Overal bed mobility: Needs Assistance Bed Mobility: Rolling;Supine to Sit;Sit to Supine Rolling: Max assist Sidelying to sit: +2 for physical assistance;Max assist   Sit to supine: Mod assist;Max assist;+2 for physical assistance   General bed mobility comments: assist with rolling and side to sit technique for increased engagement, but pt not participating much; sit to supine pt more engaged as wanted to lie down, so assist for safety with trunk and assist to lift legs  Transfers                 General transfer comment: NT due to pt not working to stay upright at EOB and  pushing back at times vs forward; sat up in chair position in bed  Ambulation/Gait                 Stairs             Wheelchair Mobility    Modified Rankin (Stroke Patients Only) Modified Rankin (Stroke Patients Only) Pre-Morbid Rankin Score: No symptoms Modified Rankin: Severe disability     Balance Overall balance assessment: Needs assistance Sitting-balance support: Bilateral upper extremity supported;Feet supported Sitting balance-Leahy Scale: Zero Sitting balance - Comments: mod to max A for balance at EOB about 15 minutes with SLP entering during session and placing PMSV and trying to engage in speech.  At times pushing back into PT and other times lurching forward, but no attempts noted for balance reactions                                    Cognition Arousal/Alertness: Awake/alert Behavior During Therapy: Flat affect Overall Cognitive Status: Impaired/Different from baseline Area of Impairment: Attention;Following commands;Safety/judgement;Problem solving                   Current Attention Level: Focused   Following Commands: Follows one step commands inconsistently;Follows one step commands with increased time Safety/Judgement: Decreased awareness of safety;Decreased awareness of deficits   Problem Solving: Slow processing;Requires verbal cues;Decreased initiation        Exercises Other Exercises Other Exercises: attempted to engage in  LE therex in supine with AAROM, but pt not participating.    General Comments General comments (skin integrity, edema, etc.): washed out appearance and limited engagement in sitting, BP not taken      Pertinent Vitals/Pain Pain Assessment: Faces Faces Pain Scale: Hurts a little bit Pain Location: generalized Pain Descriptors / Indicators: Discomfort;Restless Pain Intervention(s): Monitored during session;Limited activity within patient's tolerance;Repositioned    Home Living                       Prior Function            PT Goals (current goals can now be found in the care plan section) Acute Rehab PT Goals Patient Stated Goal: to d/c home with mother after rehab PT Goal Formulation: With patient/family Time For Goal Achievement: 06/20/19 Potential to Achieve Goals: Fair Progress towards PT goals: Goals downgraded-see care plan    Frequency    Min 3X/week      PT Plan Current plan remains appropriate    Co-evaluation PT/OT/SLP Co-Evaluation/Treatment: Yes Reason for Co-Treatment: Complexity of the patient's impairments (multi-system involvement);Necessary to address cognition/behavior during functional activity;For patient/therapist safety PT goals addressed during session: Mobility/safety with mobility;Balance   SLP goals addressed during session: Communication    AM-PAC PT "6 Clicks" Mobility   Outcome Measure  Help needed turning from your back to your side while in a flat bed without using bedrails?: Total Help needed moving from lying on your back to sitting on the side of a flat bed without using bedrails?: Total Help needed moving to and from a bed to a chair (including a wheelchair)?: Total Help needed standing up from a chair using your arms (e.g., wheelchair or bedside chair)?: Total Help needed to walk in hospital room?: Total Help needed climbing 3-5 steps with a railing? : Total 6 Click Score: 6    End of Session Equipment Utilized During Treatment: Gait belt Activity Tolerance: Patient limited by fatigue Patient left: in bed;with call bell/phone within reach;with bed alarm set   PT Visit Diagnosis: Other abnormalities of gait and mobility (R26.89);Muscle weakness (generalized) (M62.81);Other symptoms and signs involving the nervous system (R29.898)     Time: 1342-1410 PT Time Calculation (min) (ACUTE ONLY): 28 min  Charges:  $Therapeutic Activity: 23-37 mins                     Carlos Brewer, [facility] [phone] 06/06/2019    Carlos Brewer 06/06/2019, 5:19 PM

## 2019-06-06 NOTE — Progress Notes (Signed)
Order for PICC received.  Spoke with Alroy Dust, RN.  Advised that PICC cannot be placed without approval from nephrology due to patient being actively dialyzed.  Also advised that PICC will not be able to be placed tonight.  Alroy Dust stated that he would contact nephrology for approval.  Aware that order must come from nephrology or progress note written stating which nephrologist gave approval for PICC to be placed.

## 2019-06-06 NOTE — Progress Notes (Signed)
Per Dr. Johnney Ou, pt cannot have PICC line. Will notify Dr. Vaughan Browner.

## 2019-06-06 NOTE — Progress Notes (Signed)
  Speech Language Pathology Treatment: Nada Boozer Speaking valve  Patient Details Name: Carlos Brewer. MRN: [id number] DOB: April 03, 1985 Today's Date: 06/06/2019 Time: 1350-1410 SLP Time Calculation (min) (ACUTE ONLY): 20 min  Assessment / Plan / Recommendation Clinical Impression  Pt was seen during PT session, very alert while sitting EOB. PMV was placed while pt was on ATC with no overt signs of intolerance. He did have a moment while lying flat to get back in bed in which his breathing became a little faster and a little more audible, but no back pressure was noted upon removal. He initiated minimal verbal output despite cueing but he did produce spontaneous phonation that was soft but clear in quality. Overall pt appeared to be more expressive with his facial expressions today than during previous SLP visits, and he followed a few one-step commands after a delay in processing. Will continue to follow for PMV as well as potential to resume PO diet.    HPI HPI: 34 yo male smoker found to have slurred speech and Rt sided weakness.  CT head showed M2 occlusion.  Ultimately treated with thrombolytic and thrombectomy by IR.  Required intubation for airway protection. Course complicated by septic shock, AKI requiring CRRT and polymorphic VT. Intubated 2/5-2/18.      SLP Plan  Continue with current plan of care       Recommendations         Patient may use Passy-Muir Speech Valve: Intermittently with supervision PMSV Supervision: Full MD: Please consider changing trach tube to : Smaller size;Cuffless         Oral Care Recommendations: Oral care QID Follow up Recommendations: LTACH Plan: Continue with current plan of care       GO                 Osie Bond., M.A. [facility] Pager [phone] Office [phone]  06/06/2019, 3:56 PM

## 2019-06-07 DIAGNOSIS — A419 Sepsis, unspecified organism: Secondary | ICD-10-CM | POA: Diagnosis not present

## 2019-06-07 DIAGNOSIS — J9601 Acute respiratory failure with hypoxia: Secondary | ICD-10-CM | POA: Diagnosis not present

## 2019-06-07 DIAGNOSIS — R6521 Severe sepsis with septic shock: Secondary | ICD-10-CM | POA: Diagnosis not present

## 2019-06-07 LAB — PHOSPHORUS: Phosphorus: 8.3 mg/dL — ABNORMAL HIGH (ref 2.5–4.6)

## 2019-06-07 LAB — COMPREHENSIVE METABOLIC PANEL
ALT: 30 U/L (ref 0–44)
AST: 36 U/L (ref 15–41)
Albumin: 2.4 g/dL — ABNORMAL LOW (ref 3.5–5.0)
Alkaline Phosphatase: 132 U/L — ABNORMAL HIGH (ref 38–126)
Anion gap: 15 (ref 5–15)
BUN: 105 mg/dL — ABNORMAL HIGH (ref 6–20)
CO2: 18 mmol/L — ABNORMAL LOW (ref 22–32)
Calcium: 8.7 mg/dL — ABNORMAL LOW (ref 8.9–10.3)
Chloride: 106 mmol/L (ref 98–111)
Creatinine, Ser: 4.39 mg/dL — ABNORMAL HIGH (ref 0.61–1.24)
GFR calc Af Amer: 19 mL/min — ABNORMAL LOW (ref >60)
GFR calc non Af Amer: 16 mL/min — ABNORMAL LOW (ref >60)
Glucose, Bld: 129 mg/dL — ABNORMAL HIGH (ref 70–99)
Potassium: 3 mmol/L — ABNORMAL LOW (ref 3.5–5.1)
Sodium: 139 mmol/L (ref 135–145)
Total Bilirubin: 0.9 mg/dL (ref 0.3–1.2)
Total Protein: 6.3 g/dL — ABNORMAL LOW (ref 6.5–8.1)

## 2019-06-07 LAB — COOXEMETRY PANEL
Carboxyhemoglobin: 3 % — ABNORMAL HIGH (ref 0.5–1.5)
Methemoglobin: 0.9 % (ref 0.0–1.5)
O2 Saturation: 92.3 %
Total hemoglobin: 7.8 g/dL — ABNORMAL LOW (ref 12.0–16.0)

## 2019-06-07 LAB — CBC
HCT: 24.8 % — ABNORMAL LOW (ref 39.0–52.0)
Hemoglobin: 7.6 g/dL — ABNORMAL LOW (ref 13.0–17.0)
MCH: 32.3 pg (ref 26.0–34.0)
MCHC: 30.6 g/dL (ref 30.0–36.0)
MCV: 105.5 fL — ABNORMAL HIGH (ref 80.0–100.0)
Platelets: 241 10*3/uL (ref 150–400)
RBC: 2.35 MIL/uL — ABNORMAL LOW (ref 4.22–5.81)
RDW: 21.2 % — ABNORMAL HIGH (ref 11.5–15.5)
WBC: 8.1 10*3/uL (ref 4.0–10.5)
nRBC: 0 % (ref 0.0–0.2)

## 2019-06-07 LAB — PROTIME-INR
INR: 1.9 — ABNORMAL HIGH (ref 0.8–1.2)
Prothrombin Time: 21.3 seconds — ABNORMAL HIGH (ref 11.4–15.2)

## 2019-06-07 LAB — GLUCOSE, CAPILLARY
Glucose-Capillary: 120 mg/dL — ABNORMAL HIGH (ref 70–99)
Glucose-Capillary: 131 mg/dL — ABNORMAL HIGH (ref 70–99)
Glucose-Capillary: 143 mg/dL — ABNORMAL HIGH (ref 70–99)
Glucose-Capillary: 197 mg/dL — ABNORMAL HIGH (ref 70–99)
Glucose-Capillary: 86 mg/dL (ref 70–99)
Glucose-Capillary: 95 mg/dL (ref 70–99)

## 2019-06-07 MED ORDER — VITAL 1.5 CAL PO LIQD
1000.0000 mL | ORAL | Status: AC
  Administered 2019-06-07 – 2019-06-19 (×10): 1000 mL
  Filled 2019-06-07 (×20): qty 1000

## 2019-06-07 MED ORDER — WARFARIN SODIUM 4 MG PO TABS
4.0000 mg | ORAL_TABLET | Freq: Once | ORAL | Status: DC
  Filled 2019-06-07: qty 1

## 2019-06-07 MED ORDER — POTASSIUM CHLORIDE 10 MEQ/50ML IV SOLN
10.0000 meq | INTRAVENOUS | Status: AC
  Administered 2019-06-07 (×2): 10 meq via INTRAVENOUS
  Filled 2019-06-07 (×2): qty 50

## 2019-06-07 MED ORDER — WARFARIN SODIUM 4 MG PO TABS
4.0000 mg | ORAL_TABLET | Freq: Once | ORAL | Status: AC
  Administered 2019-06-07: 4 mg
  Filled 2019-06-07: qty 1

## 2019-06-07 NOTE — Progress Notes (Signed)
NAME:  Carlos [place]., MRN:  [id number], DOB:  April 19, 1985, LOS: 82 ADMISSION DATE:  04/19/2019, CONSULTATION DATE:  04/25/2019 REFERRING MD:  Dr. Leonel Ramsay, CHIEF COMPLAINT:  Slurred speech  Brief History   34 yo male smoker found to have slurred speech and Rt sided weakness.  Admitted with left MCA CVA and multiple smaller embolic infarcts s/p tPA and thrombectomy in IR.  Additionally found to have left ventricular  thrombus.    Course complicated by septic shock r/t HCAP, AKI requiring CRRT, polymorphic VT and cardiogenic shock.  Intermittent pressor dependence, required tracheostomy  Past Medical History  Systolic CHF with non ischemic CM, Cocaine abuse, OSA, DM Hx of CHF (EF 15%)  Hx myocarditis Smoker 1/2 ppd   Significant Hospital Events   2/05 Admit, tPA, IR thrombectomy 2/6 100% on fvent at 2300  Overnight requiring increasing levophed and phenylephrine.    2/6: switched to levophed, epi, started antibiotics, started on CRRT.  2/9  developed wide-complex tachycardia and hypotension, started on amiodarone drip, increase Levophed drip 2/15 drop in platelets, heparin stopped, bival started 2/17 Hypotensive and back on Levophed 2/18 trach, lines changed 2/19 started milrinone , fever 103 2/20 Episode of wide-complex tachycardia yesterday, changed from Levophed to vasopressin 2/22 stopping vanc. Still on inotrope support. Some AF w/ RVR. Had to be placed back on pressors. ivabradine added 2/23 still on pressors/ CRRT continued 2/24 tmax 98.4, Remains on CRRT, even UF, remains anuric, Levophed at 14 mcg/min, Milrinone at 0.125 mcg/kg/min, Coox 63.4 Doing well this morning on ATC, no events overnight.  Midodrine added   2/25 more interactive, remains on ATC, tmax 99.5/ WBC 21.4, ~900 ml liquid stool/ 24 hours, neg Cdiff, Levophed up to 20 mcg/min, CVP 2, ,milrinone remains at 0.125 mcg/kg/min, coox 92.7, Off CRRT since last night ~2000 s/p clotted x 3 off citrate,  restarted  3/1 Amio drip restarted for WCT/atrial flutter, milrinone turned off 3/2 CRRT stopped, permacath placed 3/5 crrt resumed 3/6 started on pressors for crrt tolerance 3/8 No longer on CRRT or pressor support  3/12 Tolerated PS for >2 hours  3/17  febrile 106 overnight, shock on pressors, new lines placed, broad-spectrum antibiotics 3/22: Awake, appears comfortable on pressure support ventilation.  Currently weaning Precedex, he is back on low-dose norepinephrine Consults:  Neuro IR Cardiology  Nephrology Heart Failure  EP   Procedures:  2/5-2/6:  TPA given at 428 am (total of 90 Mg) 520 am went to IR  S/P Lt common carotid arteriogram followed by complete revascularization of occluded LT MCA sup division mid M2 seg with x 1 pass with 38mx 40 mm solitaire X ret river device and penumbra aspiration with TICI 3 revascularization  ETT 2/05 >> 2/18 LIJ CVL 2/5 >>2/18  RT Gerber CVL 2/18 >>3/2 RIJ HD cath 2/6 >>2/18 6 shiley cuffed trach 2/18  >>3/4  RT fem HD 2/19 >>3/2 RIJ permacath 3/2>> 3/4 shiley 4 uncuffed.... dislodged placed back 6cuffed shiley that evening 3/5: change to #6 cuffed distal XLT  3/17 right femoral CVL >> 3/17 right femoral arterial line >>  Significant Diagnostic Tests:  CT angio head/neck 2/05 >> occlusion of Lt MCA bifurcation Echo 2/05 >> EF less than 20%, cannot rule out apical thrombus MRI brain 2/11 > extensive acute infarction of multiple areas without large or medium vessel occlusion Echocardiogram 2/8 Left ventricular ejection fraction, by estimation, is <20%. The left  ventricle has severely decreased function. The left ventricle demonstrates  global hypokinesis. The  left ventricular internal cavity size was severely  dilated. Possible small 0.8 x 0.6 cm apical thrombus.   3/16 duplex upper and lower extremities >>neg  3/17 head CT >.  New area of hypoattenuation in the left pons , left frontal encephalomalacia related to old infarct   Micro Data:  SARS CoV2 PCR 2/05 >> negative Influenza PCR 2/05 >> negative Urine 2/6 >> ng resp 2/6 >> nml flora BC 2/6 >>ng resp 2/16 >> ng [employer] 2/19 >> negative  BC x 2 2/25 >>ng Cdiff 2/25 >> neg Trach asp 3/11>> Few candida albicans BC 3/11>> 1 of 4 with Coag neg staph  BC 3/12 >>neg  BC x 2 3/17 >>> neg   Antimicrobials:  mero 2/6 -2/12 Zosyn 2/6 , 2/19 >> 2/23 vanc 2/6 >> 2/9 , 2/19 >>2/22 Ceftriaxone 3/12> 3/16 3/17 vanc >> 3/19 3/17 Anidulafungin >> 3/19 3/17 cefepime >>  3/22   Scheduled Meds: . amiodarone  200 mg Per Tube BID  . ARIPiprazole  10 mg Per Tube Daily  . aspirin  81 mg Per Tube Daily  . atorvastatin  40 mg Per Tube q1800  . atropine  0.5 mg Intravenous Once  . chlorhexidine gluconate (MEDLINE KIT)  15 mL Mouth Rinse BID  . Chlorhexidine Gluconate Cloth  6 each Topical Daily  . darbepoetin (ARANESP) injection - NON-DIALYSIS  150 mcg Subcutaneous Q Mon-1800  . feeding supplement (PRO-STAT SUGAR FREE 64)  60 mL Per Tube BID  . FLUoxetine  20 mg Per Tube Daily  . free water  100 mL Per Tube Q4H  . insulin aspart  0-20 Units Subcutaneous Q4H  . insulin aspart  6 Units Subcutaneous Q4H  . insulin detemir  40 Units Subcutaneous BID  . mouth rinse  15 mL Mouth Rinse 10 times per day  . midodrine  5 mg Per Tube Q8H  . pantoprazole sodium  40 mg Per Tube Q1200  . sodium chloride flush  10-40 mL Intracatheter Q12H  . Warfarin - Pharmacist Dosing Inpatient   Does not apply q1600   Continuous Infusions: . sodium chloride Stopped (06/07/19 0832)  . sodium chloride Stopped (05/21/19 0940)  . anticoagulant sodium citrate 5 mL (06/01/19 0331)  . dexmedetomidine (PRECEDEX) IV infusion Stopped (06/07/19 0830)  . feeding supplement (VITAL 1.5 CAL) 20 mL/hr at 06/05/19 1800  . norepinephrine (LEVOPHED) Adult infusion Stopped (06/06/19 1545)   PRN Meds:.sodium chloride, anticoagulant sodium citrate, bisacodyl, docusate, fentaNYL (SUBLIMAZE) injection, lip balm,  loperamide HCl, midazolam, ondansetron (ZOFRAN) IV, sodium chloride flush, zolpidem   Interim history/subjective:   alert, smiles and tracks but does not follow complex commands requiring interpretation  Objective   Blood pressure 114/74, pulse 64, temperature (!) 96.6 F (35.9 C), resp. rate 15, height 6' 1"  (1.854 m), weight 108.5 kg, SpO2 100 %.    Vent Mode: Stand-by FiO2 (%):  [28 %-40 %] 28 % PEEP:  [5 cmH20] 5 cmH20 Pressure Support:  [5 cmH20] 5 cmH20   Intake/Output Summary (Last 24 hours) at 06/07/2019 1007 Last data filed at 06/07/2019 0900 Gross per 24 hour  Intake 1440.45 ml  Output 1195 ml  Net 245.45 ml   Filed Weights   06/01/19 0730 06/03/19 1226 06/03/19 1700  Weight: 104.2 kg 108.7 kg 108.5 kg     Physical Exam: Tmax 98.3  Alert appearing nad p 24 h T collar @ 28%  Pt   nad @ 30 degrees hob  No jvd  Neck supple/ trach in place Lungs with  distant bs bilaterally RRR no s3 or or sign murmur Abd obese soft with nl excursion  Extr warm with no edema   Neuro  F/c      Resolved Hospital Problem list   HCAP 4/97, Acute metabolic encephalopathy 2nd to hypoxia and renal failure, Cardiogenic shock  Assessment & Plan:    Circulatory shock, this is been recurrent in nature.  Has underlying cardiomyopathy but more recently complicated by recurrent sepsis in the setting of what was felt to be aspiration pneumonia Plan Added back midodrine 3/22 and this may have helped  Continue telemetry monitoring Keep euvolemic/ off pressors if possible     Chronic systolic CHF, non-ischemic Polymorphic VT. Apical thrombus in left ventricle. HLD. Plan Continuing aspirin, Lipitor, and amiodarone Continuing  midodrine Continue telemetry monitoring  Acute toxic metabolic encephalopathy/Uremic encephalopathy Hyperactive delirium  Plan Continue Abilify, instead of Seroquel due to drug interactions, continue Prozac and Ambien    Acute hypoxic, hypercapnic respiratory  failure with compromised airway in setting of CVA.  Complicated by recurrent aspiration and failure to wean from vent s/p tracheostomy.  -Unfortunately it seems as though his cardiomyopathy and renal failure also playing a role in liberating him from ventilator Plan  >>> 24 h T collar  Mobilization Keep euvolemic, avoid positive fluid balance VAP bundle   Lt MCA CVA likely embolic. Left pontine CVA, ?new noted on 3/17 Hx of cocaine abuse, depression. Plan Continuing supportive care   AKI from ATN in setting of hypoxia, cardiogenic shock. Off CVVH since 3/7 and holding off HD for now  Plan  per renal   Fluid and electrolyte imbalance/acid-base imbalance: Hypokalemia, anion gap metabolic acidosis Plan >>>  Need to be careful with KCl supplements  if not dialyzing         HIT: Platelets have  normalized Lab Results  Component Value Date   PLT 241 06/07/2019   PLT 225 06/06/2019   PLT 204 06/05/2019    Plan Continuing warfarin per pharmacy    Anemia of critical illness no evidence of bleeding   Lab Results  Component Value Date   HGB 7.6 (L) 06/07/2019   HGB 8.0 (L) 06/06/2019   HGB 8.3 (L) 06/05/2019    Plan Trend CBC Transfuse for hemoglobin less than 7 or if vol needed for low bp again given that he has chf     DM type II poorly controlled with hyperglycemia. Plan Continue Levemir and sliding scale insulin    Christinia Gully, MD [facility] [place] [phone] After 6:00 PM or weekends, use Beeper [phone]  After 7:00 pm call Elink  [phone]

## 2019-06-07 NOTE — Progress Notes (Signed)
ANTICOAGULATION CONSULT NOTE  Pharmacy Consult:  Bivalirudin >> Coumadin Indication: stroke 2/5, LV thrombus 2/8, HIT 2/15; sepsis  Allergies  Allergen Reactions  . Heparin Other (See Comments)    Heparin induced thrombocytopenia. 2/15 HIT OD 1.692. 2/16 SRA positive-90.     Patient Measurements: Height: 6\' 1"  (185.4 cm) Weight: 239 lb 3.2 oz (108.5 kg) IBW/kg (Calculated) : 79.9   Vital Signs: Temp: 96.6 F (35.9 C) (03/23 0900) Temp Source: Bladder (03/23 0400) BP: 114/74 (03/23 0630) Pulse Rate: 64 (03/23 0900)  Labs: Recent Labs    06/05/19 0616 06/05/19 0616 06/06/19 0500 06/07/19 0411  HGB 8.3*   < > 8.0* 7.6*  HCT 27.7*  --  26.6* 24.8*  PLT 204  --  225 241  LABPROT 27.4*  --  23.8* 21.3*  INR 2.6*  --  2.1* 1.9*  CREATININE 4.95*  --  4.26* 4.39*   < > = values in this interval not displayed.    Estimated Creatinine Clearance: 30.3 mL/min (A) (by C-G formula based on SCr of 4.39 mg/dL (H)).  Assessment: 34 yr old male with history of ESRD on HD presented with left MCA infarct - received TPA and revascularization with IR on 2/5. On 2/8 found to have small LV apical thrombus on ECHO. Pharmacy consulted to dose IV heparin, which was switched to bivalirudin 2/15 for HIT. 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 SRA very positive at 16. Heparin allergy has appropriately been added to chart and updated with SRA results. Transitioned off bivalrudin on 3/14. Now on warfarin alone.  INR is subtherapeutic today at 1.9. Warfarin dosing cautiously.  Hgb low at 7.6 this AM-anemia of ESRD received Aranesp 3/22 with HD. No bleeding reported.   INR elevation may have been caused by drug interaction with amiodarone or combination of this, tube feeds being held and restarted at lower rate (not at goal), and false elevation due to hemodialysis. Patient also has a hospital history of shock liver and has recently required vasopressors for shock  state. LFTs WNL this AM.   Goal of Therapy:  INR 2-3 Monitor platelets by anticoagulation protocol: Yes   Plan:  - Give warfarin 4 mg PO x1 this evening - Monitor daily INR, DDI with amiodarone, s/sx of bleeding

## 2019-06-07 NOTE — Progress Notes (Signed)
Occupational Therapy Treatment Patient Details Name: Carlos Brewer. MRN: [id number] DOB: Aug 08, 1985 Today's Date: 06/07/2019    History of present illness Pt is a 34 y/o male smoker who initially presented on 2/5 with slurred speech and Rt sided weakness. Admitted with left MCA CVA and multiple smaller embolic infarcts s/p tPA and thrombectomy in IR.  Additionally found to have left ventricular  thrombus. Hospital course complicated by septic shock r/t HCAP, AKI requiring CRRT, polymorphic VT and cardiogenic shock. Echo EF < 20%. Pt with Intermittent pressor dependence, required tracheostomy. Tolerating trach collar 3/4, back on vent 3/5 with flash pulmonary edema.  Started IHD 3/10.   OT comments  Upon arrival, pt supine in bed and awake. Pt with increased eye contact and following of commands this session. Pt performing oral care with Mod A and Max cues with bed in chair position. Pt requiring increased encouragement for participation of exercises including pulling trunk into upright posture and BUE ROM. Continue to recommend dc to post acute rehab and will continue to follow acutely as admitted.    Follow Up Recommendations  LTACH;Supervision/Assistance - 24 hour    Equipment Recommendations  Other (comment)(TBD)    Recommendations for Other Services      Precautions / Restrictions Precautions Precautions: Fall Precaution Comments: trach collar/flexiseal Restrictions Weight Bearing Restrictions: No       Mobility Bed Mobility Overal bed mobility: Needs Assistance             General bed mobility comments: Focused session on bed level exercises and ADLs. Max A for repositioning with assist from RN  Transfers                      Balance                                           ADL either performed or assessed with clinical judgement   ADL Overall ADL's : Needs assistance/impaired     Grooming: Oral care;Moderate assistance;Bed  level Grooming Details (indicate cue type and reason): Positioning pt in chair position in bed. Holding cup in L hand and then holding yonker/swab with Mod A for hand over hand and support at elbow. Assistance for coorindation of brushing teeth                               General ADL Comments: Pt performing oral care at bed level and then BUE exercises. Increased engagment this session     Vision       Perception     Praxis      Cognition Arousal/Alertness: Awake/alert Behavior During Therapy: Flat affect Overall Cognitive Status: Impaired/Different from baseline Area of Impairment: Attention;Following commands;Safety/judgement;Problem solving                   Current Attention Level: Focused;Sustained   Following Commands: Follows one step commands inconsistently;Follows one step commands with increased time Safety/Judgement: Decreased awareness of safety;Decreased awareness of deficits   Problem Solving: Slow processing;Requires verbal cues;Decreased initiation General Comments: Pt with increased engagement and facial expression this session. Pt noding yes/no to simple questions ~50 of time. Pt following simple, direct cues ~50% of time such as elevating RUE, reaching for ADL item, or return ot upright posture.  Exercises Exercises: General Upper Extremity;Other exercises General Exercises - Upper Extremity Shoulder Flexion: AAROM;Both;10 reps;Supine(HOB elevated) Elbow Flexion: AAROM;Both;10 reps;Supine Elbow Extension: AAROM;Both;10 reps;Supine Other Exercises Other Exercises: Holding BUEs to then pull trunk into upright posture. 3 reps. Pt attempting to lean to sides and then correcting posture with cues   Shoulder Instructions       General Comments VSS on trach collar    Pertinent Vitals/ Pain       Pain Assessment: Faces Faces Pain Scale: Hurts a little bit Pain Location: generalized Pain Descriptors / Indicators:  Discomfort;Restless Pain Intervention(s): Monitored during session;Limited activity within patient's tolerance;Repositioned  Home Living                                          Prior Functioning/Environment              Frequency  Min 2X/week        Progress Toward Goals  OT Goals(current goals can now be found in the care plan section)  Progress towards OT goals: Progressing toward goals  Acute Rehab OT Goals Patient Stated Goal: to d/c home with mother after rehab OT Goal Formulation: With patient Time For Goal Achievement: 06/01/19 Potential to Achieve Goals: Good ADL Goals Pt Will Perform Grooming: with modified independence;sitting Pt Will Perform Upper Body Dressing: sitting;with supervision Pt/caregiver will Perform Home Exercise Program: Increased strength;Increased ROM;Both right and left upper extremity;With Supervision;With written HEP provided Additional ADL Goal #1: Pt will maintain sitting balance EOB >10 min at supervision level in prep for ADL. Additional ADL Goal #2: Pt will follow multi step commands with 75% accuracy during functional task completion. Additional ADL Goal #3: Pt will perform bed mobility with minguard assist as precursor to EOB/OOB ADL.  Plan Discharge plan remains appropriate    Co-evaluation                 AM-PAC OT "6 Clicks" Daily Activity     Outcome Measure   Help from another person eating meals?: Total Help from another person taking care of personal grooming?: A Lot Help from another person toileting, which includes using toliet, bedpan, or urinal?: Total Help from another person bathing (including washing, rinsing, drying)?: Total Help from another person to put on and taking off regular upper body clothing?: Total Help from another person to put on and taking off regular lower body clothing?: Total 6 Click Score: 7    End of Session Equipment Utilized During Treatment: Oxygen(trach)  OT  Visit Diagnosis: Muscle weakness (generalized) (M62.81);Other abnormalities of gait and mobility (R26.89)   Activity Tolerance Patient tolerated treatment well;Patient limited by fatigue   Patient Left in bed;with call bell/phone within reach;with bed alarm set   Nurse Communication Mobility status        Time: 5188-4166 OT Time Calculation (min): 24 min  Charges: OT General Charges $OT Visit: 1 Visit OT Treatments $Self Care/Home Management : 8-22 mins $Therapeutic Activity: 8-22 mins  [place], OTR/L Acute Rehab Pager: [phone] Office: [place] 06/07/2019, 6:10 PM

## 2019-06-07 NOTE — Progress Notes (Signed)
eLink Physician-Brief Progress Note Patient Name: Carlos Brewer. DOB: 1985/03/29 MRN: [id number]   Date of Service  06/07/2019  HPI/Events of Note  K+ 3.0, GFR 16  eICU Interventions  KCL 10 meq iv Q 1 hour x 2 then check K+        Frederik Pear 06/07/2019, 6:43 AM

## 2019-06-07 NOTE — Consult Note (Signed)
[place] Nurse Consult Note: Reason for Consult: groin wound Wound type: Medical Device Related Pressure Injury; noted to be consistent with pressure from the central line clamps that are under the sterile dressing Pressure Injury POA: No Measurement:1cm x 0.2cm x 0.1cm  Wound bed: clean and pink Drainage (amount, consistency, odor) scant, no odor Periwound: intact Dressing procedure/placement/frequency: Unable to provide wound care under sterile dressing.  Monitor for changes. Attempt to reposition clamps away from site.   Discussed POC with patient and bedside nurse.  Re consult if needed, will not follow at this time. Thanks  Terah Robey [employer], RN,CWOCN, CNS, [place] [phone])

## 2019-06-07 NOTE — Progress Notes (Signed)
[facility] Progress Note    Assessment/ Plan:   1.  AKIpresumably due to ATN with ischemic/nephrotoxic insults worsened by cardiogenic shock. Started on CRRT 04/23/19-  3/7.   Tolerating IHD since 3/10.  Have not done perm access due to poor prognosis.  If having some element of renal recovery given increased UOP but HD today for clearance.   No heparin.     2.Acute stroke- presumed embolic with bilateral infarcts and LV thrombus- received tpa on admission  3. SVT/polymorphic VT- per Cardiology, using 4K today for dialysis  4. Acute hypoxic respiratory failure- s/p trach--> tol TC  5. 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 HIT Ab+, s/p bivalirudin and now on warfarin alone  6. HTN/vol: on and off pressors, on midodrine too; net even UF today.    7. Anemia-   iron stores low, got feraheme on 3/9 and 3/12,  - also  giving ESA - darbe 100 per week > increase dose for 3/22 to 150 given Hb in 8s.   8. BMM-  Phos was ok then  9.6 on 3/17 -  No binder yet, check tomorrow and add binder if still up.   9. Disposition- overall prognosis is poor with ongoing RRT , trach, as well as cardiogenic shock/cardiomyopathy.  LTACH seems the most likely option  Subjective:    UOP increasing--> had 891m yesterday Afebrile HD today   Objective:   BP 118/77 (BP Location: Left Arm)   Pulse 99   Temp (!) 96.6 F (35.9 C)   Resp (!) 30   Ht 6' 1"  (1.854 m)   Wt 110 kg   SpO2 100%   BMI 31.99 kg/m   Intake/Output Summary (Last 24 hours) at 06/07/2019 1407 Last data filed at 06/07/2019 1200 Gross per 24 hour  Intake 1371.71 ml  Output 1180 ml  Net 191.71 ml   Weight change:   Physical Exam: Gen: awake pulling at foley HEENT - trach on TC  CVS: S1S2 no rub Resp:reduced breath sounds Abd: soft/ND Ext:  no edema Access: rightIJtunneled catheter  Imaging: [place]EKG SITE RITE  Result Date: 06/06/2019 If Site Rite image not attached, placement could not be  confirmed due to current cardiac rhythm.   Labs: BMET Recent Labs  Lab 06/01/19 0754 06/02/19 0444 06/03/19 0443 06/03/19 1133 06/03/19 1734 06/03/19 2231 06/03/19 2242 06/04/19 0500 06/05/19 0616 06/06/19 0500 06/07/19 0411  NA 145   < > 141   < > 136 137 138 137 137 140 139  K 4.6   < > 4.8   < > 2.8* 2.9* 2.8* 3.0* 3.3* 3.3* 3.0*  CL 99   < > 100  --  96* 99  --  97* 102 104 106  CO2 17*   < > 12*  --  22 21*  --  22 18* 18* 18*  GLUCOSE 441*   < > 154*  --  113* 165*  --  98 137* 111* 129*  BUN 145*   < > 99*  --  28* 38*  --  46* 66* 86* 105*  CREATININE 10.51*   < > 7.96*  --  2.82* 3.78*  --  4.24* 4.95* 4.26* 4.39*  CALCIUM 8.7*   < > 9.1  --  8.9 8.6*  --  8.7* 8.8* 9.1 8.7*  PHOS 9.6*  --   --   --   --   --   --   --   --   --   --    < > =  values in this interval not displayed.   CBC Recent Labs  Lab 06/04/19 0500 06/05/19 0616 06/06/19 0500 06/07/19 0411  WBC 11.3* 9.6 8.4 8.1  HGB 8.0* 8.3* 8.0* 7.6*  HCT 26.4* 27.7* 26.6* 24.8*  MCV 104.3* 107.8* 106.8* 105.5*  PLT 211 204 225 241    Medications:    . amiodarone  200 mg Per Tube BID  . ARIPiprazole  10 mg Per Tube Daily  . aspirin  81 mg Per Tube Daily  . atorvastatin  40 mg Per Tube q1800  . atropine  0.5 mg Intravenous Once  . chlorhexidine gluconate (MEDLINE KIT)  15 mL Mouth Rinse BID  . Chlorhexidine Gluconate Cloth  6 each Topical Daily  . darbepoetin (ARANESP) injection - NON-DIALYSIS  150 mcg Subcutaneous Q Mon-1800  . feeding supplement (PRO-STAT SUGAR FREE 64)  60 mL Per Tube BID  . FLUoxetine  20 mg Per Tube Daily  . free water  100 mL Per Tube Q4H  . insulin aspart  0-20 Units Subcutaneous Q4H  . insulin aspart  6 Units Subcutaneous Q4H  . insulin detemir  40 Units Subcutaneous BID  . mouth rinse  15 mL Mouth Rinse 10 times per day  . midodrine  5 mg Per Tube Q8H  . pantoprazole sodium  40 mg Per Tube Q1200  . sodium chloride flush  10-40 mL Intracatheter Q12H  . warfarin  4 mg  Oral ONCE-1600  . Warfarin - Pharmacist Dosing Inpatient   Does not apply [facility] [place]  06/07/2019, 2:07 PM

## 2019-06-07 NOTE — Progress Notes (Signed)
ANTICOAGULATION CONSULT NOTE  Pharmacy Consult:  Bivalirudin >> Coumadin Indication: stroke 2/5, LV thrombus 2/8, HIT 2/15; sepsis  Allergies  Allergen Reactions  . Heparin Other (See Comments)    Heparin induced thrombocytopenia. 2/15 HIT OD 1.692. 2/16 SRA positive-90.     Patient Measurements: Height: 6\' 1"  (185.4 cm) Weight: 239 lb 3.2 oz (108.5 kg) IBW/kg (Calculated) : 79.9   Vital Signs: Temp: 96.6 F (35.9 C) (03/23 1000) Temp Source: Bladder (03/23 0400) BP: 114/74 (03/23 0630) Pulse Rate: 73 (03/23 1000)  Labs: Recent Labs    06/05/19 0616 06/05/19 0616 06/06/19 0500 06/07/19 0411  HGB 8.3*   < > 8.0* 7.6*  HCT 27.7*  --  26.6* 24.8*  PLT 204  --  225 241  LABPROT 27.4*  --  23.8* 21.3*  INR 2.6*  --  2.1* 1.9*  CREATININE 4.95*  --  4.26* 4.39*   < > = values in this interval not displayed.    Estimated Creatinine Clearance: 30.3 mL/min (A) (by C-G formula based on SCr of 4.39 mg/dL (H)).  Assessment: 34 yr old male with history of ESRD on HD presented with left MCA infarct - received TPA and revascularization with IR on 2/5. On 2/8 found to have small LV apical thrombus on ECHO. Pharmacy consulted to dose IV heparin, which was switched to bivalirudin 2/15 for HIT. 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 SRA very positive at 87. Heparin allergy has appropriately been added to chart and updated with SRA results. Transitioned off bivalrudin on 3/14. Now on warfarin alone.  INR is subtherapeutic today at 1.9. Warfarin dosing cautiously.  Hgb low at 7.6 this AM-anemia of ESRD received Aranesp 3/22. No bleeding reported.   INR elevation may have been caused by drug interaction with amiodarone or combination of this, tube feeds being held and restarted at lower rate (not at goal), and false elevation due to hemodialysis. Patient also has a hospital history of shock liver and has recently required vasopressors for shock state. LFTs  WNL this AM.   Goal of Therapy:  INR 2-3 Monitor platelets by anticoagulation protocol: Yes   Plan:  - Give warfarin 4 mg PO x1 this evening - Monitor daily INR, DDI with amiodarone, s/sx of bleeding

## 2019-06-08 ENCOUNTER — Inpatient Hospital Stay (HOSPITAL_COMMUNITY): Payer: No Typology Code available for payment source

## 2019-06-08 DIAGNOSIS — I63312 Cerebral infarction due to thrombosis of left middle cerebral artery: Secondary | ICD-10-CM | POA: Diagnosis not present

## 2019-06-08 DIAGNOSIS — Z930 Tracheostomy status: Secondary | ICD-10-CM | POA: Diagnosis not present

## 2019-06-08 DIAGNOSIS — J9601 Acute respiratory failure with hypoxia: Secondary | ICD-10-CM | POA: Diagnosis not present

## 2019-06-08 LAB — COMPREHENSIVE METABOLIC PANEL
ALT: 27 U/L (ref 0–44)
AST: 91 U/L — ABNORMAL HIGH (ref 15–41)
Albumin: 2.6 g/dL — ABNORMAL LOW (ref 3.5–5.0)
Alkaline Phosphatase: 156 U/L — ABNORMAL HIGH (ref 38–126)
Anion gap: 17 — ABNORMAL HIGH (ref 5–15)
BUN: 49 mg/dL — ABNORMAL HIGH (ref 6–20)
CO2: 20 mmol/L — ABNORMAL LOW (ref 22–32)
Calcium: 8.3 mg/dL — ABNORMAL LOW (ref 8.9–10.3)
Chloride: 100 mmol/L (ref 98–111)
Creatinine, Ser: 2.87 mg/dL — ABNORMAL HIGH (ref 0.61–1.24)
GFR calc Af Amer: 31 mL/min — ABNORMAL LOW (ref >60)
GFR calc non Af Amer: 27 mL/min — ABNORMAL LOW (ref >60)
Glucose, Bld: 186 mg/dL — ABNORMAL HIGH (ref 70–99)
Potassium: 4.5 mmol/L (ref 3.5–5.1)
Sodium: 137 mmol/L (ref 135–145)
Total Bilirubin: 2.1 mg/dL — ABNORMAL HIGH (ref 0.3–1.2)
Total Protein: 6.2 g/dL — ABNORMAL LOW (ref 6.5–8.1)

## 2019-06-08 LAB — POCT I-STAT 7, (LYTES, BLD GAS, ICA,H+H)
Acid-Base Excess: 1 mmol/L (ref 0.0–2.0)
Bicarbonate: 22.5 mmol/L (ref 20.0–28.0)
Calcium, Ion: 1.1 mmol/L — ABNORMAL LOW (ref 1.15–1.40)
HCT: 27 % — ABNORMAL LOW (ref 39.0–52.0)
Hemoglobin: 9.2 g/dL — ABNORMAL LOW (ref 13.0–17.0)
O2 Saturation: 100 %
Patient temperature: 99.4
Potassium: 3.1 mmol/L — ABNORMAL LOW (ref 3.5–5.1)
Sodium: 137 mmol/L (ref 135–145)
TCO2: 23 mmol/L (ref 22–32)
pCO2 arterial: 25.6 mmHg — ABNORMAL LOW (ref 32.0–48.0)
pH, Arterial: 7.553 — ABNORMAL HIGH (ref 7.350–7.450)
pO2, Arterial: 177 mmHg — ABNORMAL HIGH (ref 83.0–108.0)

## 2019-06-08 LAB — GLUCOSE, CAPILLARY
Glucose-Capillary: 208 mg/dL — ABNORMAL HIGH (ref 70–99)
Glucose-Capillary: 175 mg/dL — ABNORMAL HIGH (ref 70–99)
Glucose-Capillary: 194 mg/dL — ABNORMAL HIGH (ref 70–99)
Glucose-Capillary: 151 mg/dL — ABNORMAL HIGH (ref 70–99)
Glucose-Capillary: 141 mg/dL — ABNORMAL HIGH (ref 70–99)
Glucose-Capillary: 226 mg/dL — ABNORMAL HIGH (ref 70–99)

## 2019-06-08 LAB — PROTIME-INR
INR: 1.5 — ABNORMAL HIGH (ref 0.8–1.2)
Prothrombin Time: 18.2 seconds — ABNORMAL HIGH (ref 11.4–15.2)

## 2019-06-08 LAB — CBC
HCT: 28.6 % — ABNORMAL LOW (ref 39.0–52.0)
Hemoglobin: 9 g/dL — ABNORMAL LOW (ref 13.0–17.0)
MCH: 32.8 pg (ref 26.0–34.0)
MCHC: 31.5 g/dL (ref 30.0–36.0)
MCV: 104.4 fL — ABNORMAL HIGH (ref 80.0–100.0)
Platelets: 331 10*3/uL (ref 150–400)
RBC: 2.74 MIL/uL — ABNORMAL LOW (ref 4.22–5.81)
RDW: 21.9 % — ABNORMAL HIGH (ref 11.5–15.5)
WBC: 13.6 10*3/uL — ABNORMAL HIGH (ref 4.0–10.5)
nRBC: 0 % (ref 0.0–0.2)

## 2019-06-08 MED ORDER — POTASSIUM CHLORIDE 20 MEQ PO PACK
20.0000 meq | PACK | Freq: Once | ORAL | Status: AC
  Administered 2019-06-08: 20 meq
  Filled 2019-06-08: qty 1

## 2019-06-08 MED ORDER — WARFARIN SODIUM 5 MG PO TABS
5.0000 mg | ORAL_TABLET | Freq: Once | ORAL | Status: AC
  Administered 2019-06-08: 5 mg via ORAL
  Filled 2019-06-08: qty 1

## 2019-06-08 MED ORDER — DEXMEDETOMIDINE HCL IN NACL 400 MCG/100ML IV SOLN
0.4000 ug/kg/h | INTRAVENOUS | Status: DC
  Administered 2019-06-08: 0.4 ug/kg/h via INTRAVENOUS
  Filled 2019-06-08: qty 100

## 2019-06-08 MED ORDER — SEVELAMER CARBONATE 2.4 G PO PACK
2.4000 g | PACK | Freq: Three times a day (TID) | ORAL | Status: DC
  Administered 2019-06-08 – 2019-06-26 (×35): 2.4 g
  Filled 2019-06-08 (×54): qty 1

## 2019-06-08 MED ORDER — METOLAZONE 5 MG PO TABS
10.0000 mg | ORAL_TABLET | Freq: Once | ORAL | Status: AC
  Administered 2019-06-08: 10 mg via ORAL
  Filled 2019-06-08: qty 2
  Filled 2019-06-08: qty 1

## 2019-06-08 MED ORDER — FUROSEMIDE 10 MG/ML IJ SOLN
160.0000 mg | Freq: Once | INTRAVENOUS | Status: AC
  Administered 2019-06-08: 160 mg via INTRAVENOUS
  Filled 2019-06-08: qty 16

## 2019-06-08 MED ORDER — INSULIN ASPART 100 UNIT/ML ~~LOC~~ SOLN
7.0000 [IU] | SUBCUTANEOUS | Status: DC
  Administered 2019-06-08 – 2019-06-28 (×77): 7 [IU] via SUBCUTANEOUS

## 2019-06-08 NOTE — Progress Notes (Signed)
Pt placed back on full support vent settings due to agitations, increased WOB, and desaturation despite increasing his 02 form 258% to 80%.

## 2019-06-08 NOTE — Progress Notes (Addendum)
Respiratory therapist Oley Balm and charge nurse made aware that patient is and has been tachypnic, with oxygen saturation varying from mid 80s to mid 90s. Pulse ox site has been changed, meds given for sleep, agitation, and pain, with no effect. This RN questioned whether patient needs to be put back on ventilator. RT and charge were unconcerned. Will continue to monitor.

## 2019-06-08 NOTE — Progress Notes (Signed)
  Speech Language Pathology Treatment: Nada Boozer Speaking valve  Patient Details Name: Carlos Brewer. MRN: [id number] DOB: 1986-03-05 Today's Date: 06/08/2019 Time: 7078-6754 SLP Time Calculation (min) (ACUTE ONLY): 16 min  Assessment / Plan / Recommendation Clinical Impression  Pt was on ATC with cuff deflated upon SLP arrival, although with RR elevated into the 30s. He was repositioned in bed which seemed to mildly improve his WOB. PMV was placed for about 15 minutes with frequent checks given baseline respirations, but no back pressure was noted. He makes spontaneous vocalizations on exhalation but does not verbalize despite Max cues and opportunities. He repeated at the phoneme level (/a/)  x1, but this could not be replicated on any other phonemes. Valve was removed upon completion of session. Will f/i for additional trials, with consideration of PO trials as his respirations improve.   HPI HPI: 34 yo male smoker found to have slurred speech and Rt sided weakness.  CT head showed M2 occlusion.  Ultimately treated with thrombolytic and thrombectomy by IR.  Required intubation for airway protection. Course complicated by septic shock, AKI requiring CRRT and polymorphic VT. Intubated 2/5-2/18.      SLP Plan  Continue with current plan of care       Recommendations         Patient may use Passy-Muir Speech Valve: Intermittently with supervision PMSV Supervision: Full MD: Please consider changing trach tube to : Smaller size;Cuffless         Oral Care Recommendations: Oral care QID Follow up Recommendations: LTACH SLP Visit Diagnosis: Dysphagia, oropharyngeal phase (R13.12) Plan: Continue with current plan of care       GO                  Osie Bond., M.A. [place] Acute Rehabilitation Services Pager [phone] Office [phone] e 06/08/2019, 11:06 AM

## 2019-06-08 NOTE — Progress Notes (Signed)
ANTICOAGULATION CONSULT NOTE  Pharmacy Consult:  Bivalirudin >> Coumadin Indication: stroke 2/5, LV thrombus 2/8, HIT 2/15; sepsis  Allergies  Allergen Reactions  . Heparin Other (See Comments)    Heparin induced thrombocytopenia. 2/15 HIT OD 1.692. 2/16 SRA positive-90.     Patient Measurements: Height: 6\' 1"  (185.4 cm) Weight: 242 lb 8.1 oz (110 kg) IBW/kg (Calculated) : 79.9   Vital Signs: Temp: 99.2 F (37.3 C) (03/24 0800) Temp Source: Oral (03/24 0800) BP: 127/70 (03/24 0900) Pulse Rate: 91 (03/24 0900)  Labs: Recent Labs    06/06/19 0500 06/06/19 0500 06/07/19 0411 06/07/19 0411 06/08/19 0500 06/08/19 0611 06/08/19 0808  HGB 8.0*   < > 7.6*   < > 9.0* 9.2*  --   HCT 26.6*   < > 24.8*  --  28.6* 27.0*  --   PLT 225  --  241  --  331  --   --   LABPROT 23.8*  --  21.3*  --   --   --  18.2*  INR 2.1*  --  1.9*  --   --   --  1.5*  CREATININE 4.26*  --  4.39*  --  2.87*  --   --    < > = values in this interval not displayed.    Estimated Creatinine Clearance: 46.7 mL/min (A) (by C-G formula based on SCr of 2.87 mg/dL (H)).  Assessment: 34 yr old male with history of ESRD on HD presented with left MCA infarct - received TPA and revascularization with IR on 2/5. On 2/8 found to have small LV apical thrombus on ECHO. Pharmacy consulted to dose IV heparin, which was switched to bivalirudin 2/15 for HIT. 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 SRA very positive at 65. Heparin allergy has appropriately been added to chart and updated with SRA results. Transitioned off bivalrudin on 3/14. Now on warfarin alone.  On 3/19, patient had supratherapeutic INRs of 4>5>4.7. INR elevation may have been caused by drug interaction with amiodarone or combination of this, tube feeds being held and restarted at lower rate (not at goal), and false elevation due to hemodialysis.   INR is subtherapeutic today at 1.5, down from 1.9 yesterday. Patient's  tube feed rate was increased from 20 ml/hr to 60 ml/hr which is likely contributing to the decrease in INR. Hgb is stable at 9.2; patient has anemia of ESRD and last received Aranesp on 3/22. No bleeding reported. Dose warfarin cautiously and assess tube feed intake regularly.   Goal of Therapy:  INR 2-3 Monitor platelets by anticoagulation protocol: Yes   Plan:  - Give warfarin 5 mg PO x1 this evening - Please consider restarting bivalirudin with subtherapeutic INR, especially if subtherapeutic again tomorrow morning - Monitor daily INR, DDI with amiodarone, s/sx of bleeding

## 2019-06-08 NOTE — Progress Notes (Signed)
Physical Therapy Treatment Patient Details Name: Carlos Brewer. MRN: [id number] DOB: 09/05/1985 Today's Date: 06/08/2019    History of Present Illness Pt is a 34 y/o male smoker who initially presented on 2/5 with slurred speech and Rt sided weakness. Admitted with left MCA CVA and multiple smaller embolic infarcts s/p tPA and thrombectomy in IR.  Additionally found to have left ventricular  thrombus. Hospital course complicated by septic shock r/t HCAP, AKI requiring CRRT, polymorphic VT and cardiogenic shock. Echo EF < 20%. Pt with Intermittent pressor dependence, required tracheostomy. Tolerating trach collar 3/4, back on vent 3/5 with flash pulmonary edema.  Started IHD 3/10.    PT Comments    Pt agreeable to participation.  Holding focus on tasks for short period with need for redirection.  Work of breathing up, but sats holding well.  Lower tone and weaker than pre uremic event.  Emphasis on transitions, sitting balance, standing in the STEDY and working on sitting tolerance in the recliner,    Follow Up Recommendations  LTACH     Equipment Recommendations  Other (comment)(TBA)    Recommendations for Other Services Rehab consult     Precautions / Restrictions Precautions Precautions: Fall    Mobility  Bed Mobility Overal bed mobility: Needs Assistance Bed Mobility: Rolling;Sidelying to Sit Rolling: Max assist Sidelying to sit: +2 for physical assistance;Max assist       General bed mobility comments: cued for technique and redirection.  truncal assist to roll and come up via R elbow.  Transfers Overall transfer level: Needs assistance   Transfers: Sit to/from Stand Sit to Stand: Total assist;+2 safety/equipment;Max assist         General transfer comment: pt clearly with lower truncal tone and activation the a couple of weeks ago, pre uremia.  Significant assist to stand in the STEDY to a submaximal stance, x3 before transfer into a  chair.  Ambulation/Gait             General Gait Details: not able yet   Stairs             Wheelchair Mobility    Modified Rankin (Stroke Patients Only) Modified Rankin (Stroke Patients Only) Modified Rankin: Severe disability     Balance   Sitting-balance support: Bilateral upper extremity supported;Feet supported Sitting balance-Leahy Scale: Poor Sitting balance - Comments: can make truncal reactions, but not maintain for more than a few seconds before losing balance in any directions.     Standing balance-Leahy Scale: Zero Standing balance comment: needing external support                            Cognition Arousal/Alertness: Awake/alert Behavior During Therapy: Flat affect Overall Cognitive Status: Impaired/Different from baseline Area of Impairment: Attention;Following commands;Safety/judgement;Problem solving                   Current Attention Level: Focused;Sustained   Following Commands: Follows one step commands inconsistently;Follows one step commands with increased time Safety/Judgement: Decreased awareness of safety;Decreased awareness of deficits   Problem Solving: Slow processing;Requires verbal cues;Decreased initiation        Exercises      General Comments General comments (skin integrity, edema, etc.): sats 91-93 on RA, upper 90's on 28% TC, EHR in the 100's increased RR      Pertinent Vitals/Pain Pain Assessment: Faces Faces Pain Scale: No hurt Pain Intervention(s): Monitored during session    Home Living  Prior Function            PT Goals (current goals can now be found in the care plan section) Acute Rehab PT Goals Patient Stated Goal: to d/c home with mother after rehab PT Goal Formulation: With patient/family Time For Goal Achievement: 06/20/19 Potential to Achieve Goals: Fair Progress towards PT goals: Progressing toward goals(starting to improve again post uremia  in the past weeks)    Frequency    Min 3X/week      PT Plan Current plan remains appropriate    Co-evaluation              AM-PAC PT "6 Clicks" Mobility   Outcome Measure  Help needed turning from your back to your side while in a flat bed without using bedrails?: Total Help needed moving from lying on your back to sitting on the side of a flat bed without using bedrails?: Total Help needed moving to and from a bed to a chair (including a wheelchair)?: Total Help needed standing up from a chair using your arms (e.g., wheelchair or bedside chair)?: Total Help needed to walk in hospital room?: Total Help needed climbing 3-5 steps with a railing? : Total 6 Click Score: 6    End of Session Equipment Utilized During Treatment: Gait belt Activity Tolerance: Patient limited by fatigue;Patient tolerated treatment well Patient left: in chair;with call bell/phone within reach;with family/visitor present;with nursing/sitter in room;Other (comment)(on maxisky pad) Nurse Communication: Mobility status PT Visit Diagnosis: Other abnormalities of gait and mobility (R26.89);Other symptoms and signs involving the nervous system (R29.898);Muscle weakness (generalized) (M62.81)     Time: 6389-3734 PT Time Calculation (min) (ACUTE ONLY): 39 min  Charges:  $Therapeutic Activity: 23-37 mins $Neuromuscular Re-education: 38-52 mins                     06/08/2019  Ginger Carne., PT Acute Rehabilitation Services [phone]  (pager) [phone]  (office)   Carlos Brewer 06/08/2019, 12:50 PM

## 2019-06-08 NOTE — Progress Notes (Signed)
NAME:  Carlos Geister., MRN:  [id number], DOB:  04-04-1985, LOS: 29 ADMISSION DATE:  04/21/2019, CONSULTATION DATE:  05/04/2019 REFERRING MD:  Dr. Leonel Ramsay, CHIEF COMPLAINT:  Slurred speech  Brief History   34 yo male smoker found to have slurred speech and Rt sided weakness.  Admitted with left MCA CVA and multiple smaller embolic infarcts s/p tPA and thrombectomy in IR.  Additionally found to have left ventricular  thrombus.    Course complicated by septic shock r/t HCAP, AKI requiring CRRT, polymorphic VT and cardiogenic shock.  Intermittent pressor dependence, required tracheostomy  Past Medical History  Systolic CHF with non ischemic CM, Cocaine abuse, OSA, DM Hx of CHF (EF 15%)  Hx myocarditis Smoker 1/2 ppd   Significant Hospital Events   2/05 Admit, tPA, IR thrombectomy 2/6 100% on fvent at 2300  Overnight requiring increasing levophed and phenylephrine.    2/6: switched to levophed, epi, started antibiotics, started on CRRT.  2/9  developed wide-complex tachycardia and hypotension, started on amiodarone drip, increase Levophed drip 2/15 drop in platelets, heparin stopped, bival started 2/17 Hypotensive and back on Levophed 2/18 trach, lines changed 2/19 started milrinone , fever 103 2/20 Episode of wide-complex tachycardia yesterday, changed from Levophed to vasopressin 2/22 stopping vanc. Still on inotrope support. Some AF w/ RVR. Had to be placed back on pressors. ivabradine added 2/23 still on pressors/ CRRT continued 2/24 tmax 98.4, Remains on CRRT, even UF, remains anuric, Levophed at 14 mcg/min, Milrinone at 0.125 mcg/kg/min, Coox 63.4 Doing well this morning on ATC, no events overnight.  Midodrine added   2/25 more interactive, remains on ATC, tmax 99.5/ WBC 21.4, ~900 ml liquid stool/ 24 hours, neg Cdiff, Levophed up to 20 mcg/min, CVP 2, ,milrinone remains at 0.125 mcg/kg/min, coox 92.7, Off CRRT since last night ~2000 s/p clotted x 3 off citrate,  restarted  3/1 Amio drip restarted for WCT/atrial flutter, milrinone turned off 3/2 CRRT stopped, permacath placed 3/5 crrt resumed 3/6 started on pressors for crrt tolerance 3/8 No longer on CRRT or pressor support  3/12 Tolerated PS for >2 hours  3/17  febrile 106 overnight, shock on pressors, new lines placed, broad-spectrum antibiotics 3/22: Awake, appears comfortable on pressure support ventilation.  Currently weaning Precedex, he is back on low-dose norepinephrine 3/23 pm back on vent at hs for resp distress / desats ? chf on cxr  Consults:  Neuro IR Cardiology  Nephrology Heart Failure  EP   Procedures:  2/5-2/6:  TPA given at 428 am (total of 90 Mg) 520 am went to IR  S/P Lt common carotid arteriogram followed by complete revascularization of occluded LT MCA sup division mid M2 seg with x 1 pass with 20mx 40 mm solitaire X ret river device and penumbra aspiration with TICI 3 revascularization  ETT 2/05 >> 2/18 [facility]J CVL 2/5 >>2/18  RT [place] CVL 2/18 >>3/2 RIJ HD cath 2/6 >>2/18 6 shiley cuffed trach 2/18  >>3/4  RT fem HD 2/19 >>3/2 RIJ permacath 3/2>> 3/4 shiley 4 uncuffed.... dislodged placed back 6cuffed shiley that evening 3/5: change to #6 cuffed distal XLT  3/17 right femoral CVL >> 3/17 right femoral arterial line >>  Significant Diagnostic Tests:  CT angio head/neck 2/05 >> occlusion of Lt MCA bifurcation Echo 2/05 >> EF less than 20%, cannot rule out apical thrombus MRI brain 2/11 > extensive acute infarction of multiple areas without large or medium vessel occlusion Echocardiogram 2/8 Left ventricular ejection fraction, by estimation, is <  20%. The left  ventricle has severely decreased function. The left ventricle demonstrates  global hypokinesis. The left ventricular internal cavity size was severely  dilated. Possible small 0.8 x 0.6 cm apical thrombus.   3/16 duplex upper and lower extremities >>neg  3/17 head CT >.  New area of hypoattenuation in the  left pons , left frontal encephalomalacia related to old infarct  Micro Data:  SARS CoV2 PCR 2/05 >> negative Influenza PCR 2/05 >> negative Urine 2/6 >> ng resp 2/6 >> nml flora BC 2/6 >>ng resp 2/16 >> ng [facility] 2/19 >> negative  BC x 2 2/25 >>ng Cdiff 2/25 >> neg Trach asp 3/11>> Few candida albicans BC 3/11>> 1 of 4 with Coag neg staph  BC 3/12 >>neg  BC x 2 3/17 >>> neg  Trach  3/24 >>>  Antimicrobials:  mero 2/6 -2/12 Zosyn 2/6 , 2/19 >> 2/23 vanc 2/6 >> 2/9 , 2/19 >>2/22 Ceftriaxone 3/12> 3/16 3/17 vanc >> 3/19 3/17 Anidulafungin >> 3/19 3/17 cefepime >>  3/22   Scheduled Meds: . amiodarone  200 mg Per Tube BID  . ARIPiprazole  10 mg Per Tube Daily  . aspirin  81 mg Per Tube Daily  . atorvastatin  40 mg Per Tube q1800  . atropine  0.5 mg Intravenous Once  . chlorhexidine gluconate (MEDLINE KIT)  15 mL Mouth Rinse BID  . Chlorhexidine Gluconate Cloth  6 each Topical Daily  . darbepoetin (ARANESP) injection - NON-DIALYSIS  150 mcg Subcutaneous Q Mon-1800  . feeding supplement (PRO-STAT SUGAR FREE 64)  60 mL Per Tube BID  . FLUoxetine  20 mg Per Tube Daily  . free water  100 mL Per Tube Q4H  . insulin aspart  0-20 Units Subcutaneous Q4H  . insulin aspart  6 Units Subcutaneous Q4H  . insulin detemir  40 Units Subcutaneous BID  . mouth rinse  15 mL Mouth Rinse 10 times per day  . metolazone  10 mg Oral Once  . midodrine  5 mg Per Tube Q8H  . pantoprazole sodium  40 mg Per Tube Q1200  . sodium chloride flush  10-40 mL Intracatheter Q12H  . warfarin  5 mg Oral ONCE-1600  . Warfarin - Pharmacist Dosing Inpatient   Does not apply q1600   Continuous Infusions: . sodium chloride Stopped (06/07/19 0938)  . sodium chloride Stopped (05/21/19 0940)  . anticoagulant sodium citrate 5 mL (06/01/19 0331)  . dexmedetomidine (PRECEDEX) IV infusion Stopped (06/08/19 0834)  . feeding supplement (VITAL 1.5 CAL) 1,000 mL (06/08/19 1009)  . furosemide     PRN Meds:.sodium chloride,  anticoagulant sodium citrate, bisacodyl, docusate, fentaNYL (SUBLIMAZE) injection, lip balm, loperamide HCl, midazolam, ondansetron (ZOFRAN) IV, sodium chloride flush, zolpidem   Interim history/subjective:  resp distress / desats overnight > rested  on vent back on t collar moderate increased wob  Objective   Blood pressure 127/70, pulse 91, temperature 99.2 F (37.3 C), temperature source Oral, resp. rate (!) 30, height 6' 1"  (1.854 m), weight 110 kg, SpO2 (!) 86 %.    Vent Mode: Stand-by FiO2 (%):  [28 %-60 %] 28 % Set Rate:  [14 bmp] 14 bmp Vt Set:  [470 mL-640 mL] 470 mL PEEP:  [5 cmH20] 5 cmH20 Plateau Pressure:  [24 cmH20] 24 cmH20   Intake/Output Summary (Last 24 hours) at 06/08/2019 1101 Last data filed at 06/08/2019 0900 Gross per 24 hour  Intake 1879.73 ml  Output 550 ml  Net 1329.73 ml   [employer]  06/03/19 1700 06/07/19 1240 06/07/19 1625  Weight: 108.5 kg 110 kg 110 kg     Physical Exam: Tmax 99.4 (no change)    Pt alert but not f/c  Mod increased wob on Tcollar @ 0.28 fio2  No jvd/ FT L naris Oropharynx clear,  mucosa nl Neck supple Lungs with a few scattered exp > insp rhonchi bilaterally RRR no s3 or or sign murmur Abd obese nl excursion  Extr warm with 1+ pitting edema      pCXR   3/24    I personally reviewed images and agree with radiology impression as follows:   Interval development of multifocal patchy/fluffy airspace opacities, left greater than right. This could be due to asymmetric edema and/or multifocal pneumonia.      Resolved Hospital Problem list   HCAP 9/38, Acute metabolic encephalopathy 2nd to hypoxia and renal failure, Cardiogenic shock  Assessment & Plan:   Circulatory shock, this is been recurrent in nature.  Has underlying cardiomyopathy but more recently complicated by recurrent sepsis in the setting of what was felt to be aspiration pneumonia Plan Added back midodrine 3/22  may have helped  Continue telemetry  monitoring Keep euvolemic/ off pressors if possible     Chronic systolic CHF, non-ischemic Polymorphic VT. Apical thrombus in left ventricle. HLD. Plan Continuing aspirin, Lipitor, and amiodarone Continuing  midodrine Continue telemetry monitoring >>>  Renal trying diuresis 3/24 to keep off vent   Acute toxic metabolic encephalopathy/Uremic encephalopathy Hyperactive delirium  Plan Continue Abilify, instead of Seroquel due to drug interactions, continue Prozac and Ambien    Acute hypoxic, hypercapnic respiratory failure with compromised airway in setting of CVA.  Complicated by recurrent aspiration and failure to wean from vent s/p tracheostomy.  -Unfortunately it seems as though his cardiomyopathy/renal failure also playing a role in liberating him from ventilator Plan >>> low threshold back on vent  If not responding to diuretics   Lt MCA CVA likely embolic. Left pontine CVA, ?new noted on 3/17 Hx of cocaine abuse, depression. Plan Continuing supportive care   AKI from ATN in setting of hypoxia, cardiogenic shock. Off CVVH since 3/7 and holding off HD for now  Lab Results  Component Value Date   CREATININE 2.87 (H) 06/08/2019   CREATININE 4.39 (H) 06/07/2019   CREATININE 4.26 (H) 06/06/2019  Plan  per renal >> attempt more rigorous diuresis   Fluid and electrolyte imbalance/acid-base imbalance: Hypokalemia, anion gap metabolic acidosis Plan >>>  Need to be careful with KCl supplements  if not dialyzing         HIT: Platelets have  normalized Lab Results  Component Value Date   PLT 331 06/08/2019   PLT 241 06/07/2019   PLT 225 06/06/2019    Plan Continuing warfarin per pharmacy    Anemia of critical illness no evidence of bleeding   Lab Results  Component Value Date   HGB 9.2 (L) 06/08/2019   HGB 9.0 (L) 06/08/2019   HGB 7.6 (L) 06/07/2019    Plan Trend CBC Transfuse for hemoglobin less than 7 or if vol needed for low bp again given that he has  chf     DM type II poorly controlled with hyperglycemia. Plan Continue Levemir and sliding scale insulin  Discussed with renal / try diuresis  if possible prior to resuming HD but low threshold to re-start vent esp at hs to rest    Pumonary infiltrates/ recurrent 3/24 ? Chf/ pna/amiodarone toxicity  >>>  See micro flow sheet  The patient is critically ill with multiple organ systems failure and requires high complexity decision making for assessment and support, frequent evaluation and titration of therapies, application of advanced monitoring technologies and extensive interpretation of multiple databases. Critical Care Time devoted to patient care services described in this note is 35  minutes.     Christinia Gully, MD Pulmonary and [place] [phone] After 6:00 PM or weekends, use Beeper [phone]  After 7:00 pm call Elink  [phone]

## 2019-06-08 NOTE — Progress Notes (Signed)
Nutrition Follow-up  DOCUMENTATION CODES:   Obesity unspecified  INTERVENTION:   Vital 1.5 @ 60 ml/hr (1440 ml/day) via Cortrak tube 60 ml Prostat BID  100 ml every 4 hours: 600 ml   Provides: 2560 kcal, 157 grams protein, and 1100 ml free water.    NUTRITION DIAGNOSIS:   Inadequate oral intake related to inability to eat as evidenced by NPO status. Ongoing.   GOAL:   Patient will meet greater than or equal to 90% of their needs Meeting with TF.   MONITOR:   TF tolerance  REASON FOR ASSESSMENT:   Ventilator   ASSESSMENT:   Pt with PMH of CHF, OSA, poorly controlled DM, cocaine abuse admitted with L MCA stroke s/p tPA and IR thrombectomy.   Renal following, pt now on iHD for renal clearance. Not removing fluid. Pt is having increased UOP.   Pt had been on trach collar > 48 hours but back on vent due to increased WOB, agitation, and desaturation overnight  2/6 started on CRRT  2/18 s/p trach placement 3/10 CRRT stopped 3/12 iHD 3/17 febrile; developed septic shock on pressors, lines changed, on abx  3/18 Cortrak vomited out, cortrak replaced  MV: 16.9 L/min Temp (24hrs), Avg:98.1 F (36.7 C), Min:96.6 F (35.9 C), Max:99.4 F (37.4 C)  Medications reviewed and include:  6 units novolog every 4 hours, 40 units levemir BID  Precedex  Labs reviewed: BUN: 49  Cr: 2.87 (H), K+ 3.1 (L), PO4: 8.3 - per renal may add binder if remains elevated CBGs:175   UOP: 305 ml x 24 hrs  UF: 0  Diet Order:   Diet Order    None      EDUCATION NEEDS:   No education needs have been identified at this time  Skin:  Skin Assessment: Skin Integrity Issues: Skin Integrity Issues:: DTI DTI: L heel Stage II: NA Other: skin tear to coccyx and penis  Last BM:  300 ml via rectal tube  Height:   Ht Readings from Last 1 Encounters:  05/07/2019 6\' 1"  (1.854 m)    Weight:   Wt Readings from Last 1 Encounters:  06/07/19 110 kg    Ideal Body Weight:  83.6 kg  BMI:   Body mass index is 31.99 kg/m.  Estimated Nutritional Needs:   Kcal:  2550  Protein:  150-170 grams  Fluid:  2 L/day  Lockie Pares., RD, LDN, CNSC See AMiON for contact information

## 2019-06-08 NOTE — TOC Progression Note (Addendum)
Transition of Care (TOC) - Progression Note    Patient Details  Name: Carlos Brewer. MRN: [id number] Date of Birth: 02-10-1986  Transition of Care [facility]) CM/SW Contact  [name], RN Phone Number: 06/08/2019, 2:53 PM  Clinical Narrative:    Spoke w patient's mother at the bedside, she is agreeable to have [facility] look at case.  I spoke w Doroteo Bradford at [facility]	[place], she confirms that they take [employer] and that they have 2 HD beds. Neither are available in the next few days, but may be soon thereafter. I have reached out to attending who will discuss with team to decide when he will be ready for potential LTACH transfer.    UPDATE: 3/25 0845  I have received a call from [facility] stating they have declined referral. I have updated Dr. Melvyn Novas.     Expected Discharge Plan: [facility] Acute Care (LTAC) Barriers to Discharge: Continued Medical Work up  Expected Discharge Plan and Services Expected Discharge Plan: [place] (LTAC)                                               Social Determinants of Health (SDOH) Interventions    Readmission Risk Interventions No flowsheet data found.

## 2019-06-08 NOTE — Progress Notes (Addendum)
Updated E-Link RN who said she would update E-Link MD. I updated her about patient being tachpnic and oxygen saturation. Awaiting new orders, will continue to monitor.

## 2019-06-08 NOTE — Progress Notes (Signed)
eLink Physician-Brief Progress Note Patient Name: Carlos Brewer. DOB: 04-27-1985 MRN: [id number]   Date of Service  06/08/2019  HPI/Events of Note  Pt has been on trach collar for the past 3 days but tonight has had respiratory distress, increasing oxygen requirement, increased secretions.  eICU Interventions  RT instructed to put Pt back on the ventilator, Precedex for sedation, ABG one hour after vent switch, send sputum for culture and sensitivity, CXR        Appolonia Ackert U Johana Hopkinson 06/08/2019, 5:11 AM

## 2019-06-08 NOTE — Progress Notes (Signed)
[facility] Progress Note    Assessment/ Plan:   1.  AKIpresumably due to ATN with ischemic/nephrotoxic insults worsened by cardiogenic shock. Started on CRRT 04/23/19-  3/7.   Tolerating IHD since 3/10.  Have not done perm access due to poor prognosis.  Last HD 3/23 for clearance.  Having increasing UOP in past few days, cont to monitor for recovery.  Plan next HD tomorrow AM.       2.Acute stroke- presumed embolic with bilateral infarcts and LV thrombus- received tpa on admission  3. SVT/polymorphic VT- per Cardiology, has not been issue recently  4. Acute hypoxic respiratory failure- s/p trach--> tol TC in past few days but briefly on vent early AM.  CXR likely pulmonary edema given no fever/^WBC.  Will attempt high dose diuretics today and plan for HD with UF tomorrow AM.   5. 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 HIT Ab+, s/p bivalirudin and now on warfarin alone  6. HTN/vol: on and off pressors, on midodrine too; net even UF today.    7. Anemia-   iron stores low, got feraheme on 3/9 and 3/12,  - also  giving ESA - darbe 100 per week > increase dose for 3/22 to 150 given Hb in 8s.   8. BMM-  Phos was ok then  9.6 on 3/17 -  Now 8.3. sevelamer 800 TID  9. Disposition- overall prognosis is poor with ongoing RRT , trach, as well as cardiogenic shock/cardiomyopathy.  LTACH seems the most likely option  Subjective:    HD yesterday - no UF as ^ UOP; UOP yesterday 398m Early AM - agitation, hypoxia and went back on vent; CXR edema v infection; no fever or leukocytosis noted Back on trach collar now and doing well per RN.   Objective:   BP (!) 147/86   Pulse 96   Temp 98.8 F (37.1 C) (Oral)   Resp (!) 23   Ht 6' 1"  (1.854 m)   Wt 110 kg   SpO2 100%   BMI 31.99 kg/m   Intake/Output Summary (Last 24 hours) at 06/08/2019 1441 Last data filed at 06/08/2019 1300 Gross per 24 hour  Intake 2125.73 ml  Output 425 ml  Net 1700.73 ml   Weight change:    Physical Exam: Gen: awake calm HEENT - trach on TC  CVS: S1S2 no rub Resp:coarse BS, no wheezes or rales appreciated Abd: soft/ND Ext:  no edema Access: rightIJtunneled catheter  Imaging: DG Chest Port 1 View  Result Date: 06/08/2019 CLINICAL DATA:  Tachypnea EXAM: PORTABLE CHEST 1 VIEW COMPARISON:  June 01, 2019 FINDINGS: The heart size and mediastinal contours are unchanged with cardiomegaly. There is new multifocal patchy/fluffy airspace opacities, prominently within the right upper lobe and throughout the left lung. No definite pleural effusion. ET tube is 1.5 cm above the carina. NG tube is seen coursing below the diaphragm. A right-sided central venous catheter with the tip at the superior cavoatrial junction. IMPRESSION: Interval development of multifocal patchy/fluffy airspace opacities, left greater than right. This could be due to asymmetric edema and/or multifocal pneumonia. Electronically Signed   By: BPrudencio PairM.D.   On: 06/08/2019 05:46   [place]KG SITE RITE  Result Date: 06/06/2019 If [facility] not [facility], placement could not be confirmed due to current cardiac rhythm.   Labs: BMET Recent Labs  Lab 06/03/19 1734 06/03/19 1734 06/03/19 2231 06/03/19 2231 06/03/19 2242 06/04/19 0500 06/05/19 0616 06/06/19 0500 06/07/19 0411 06/07/19 1430 06/08/19  0500 06/08/19 0611  NA 136   < > 137   < > 138 137 137 140 139  --  137 137  K 2.8*   < > 2.9*   < > 2.8* 3.0* 3.3* 3.3* 3.0*  --  4.5 3.1*  CL 96*  --  99  --   --  97* 102 104 106  --  100  --   CO2 22  --  21*  --   --  22 18* 18* 18*  --  20*  --   GLUCOSE 113*  --  165*  --   --  98 137* 111* 129*  --  186*  --   BUN 28*  --  38*  --   --  46* 66* 86* 105*  --  49*  --   CREATININE 2.82*  --  3.78*  --   --  4.24* 4.95* 4.26* 4.39*  --  2.87*  --   CALCIUM 8.9  --  8.6*  --   --  8.7* 8.8* 9.1 8.7*  --  8.3*  --   PHOS  --   --   --   --   --   --   --   --   --  8.3*  --   --    < > = values in  this interval not displayed.   CBC Recent Labs  Lab 06/05/19 0616 06/05/19 0616 06/06/19 0500 06/07/19 0411 06/08/19 0500 06/08/19 0611  WBC 9.6  --  8.4 8.1 13.6*  --   HGB 8.3*   < > 8.0* 7.6* 9.0* 9.2*  HCT 27.7*   < > 26.6* 24.8* 28.6* 27.0*  MCV 107.8*  --  106.8* 105.5* 104.4*  --   PLT 204  --  225 241 331  --    < > = values in this interval not displayed.    Medications:    . amiodarone  200 mg Per Tube BID  . ARIPiprazole  10 mg Per Tube Daily  . aspirin  81 mg Per Tube Daily  . atorvastatin  40 mg Per Tube q1800  . atropine  0.5 mg Intravenous Once  . chlorhexidine gluconate (MEDLINE KIT)  15 mL Mouth Rinse BID  . Chlorhexidine Gluconate Cloth  6 each Topical Daily  . darbepoetin (ARANESP) injection - NON-DIALYSIS  150 mcg Subcutaneous Q Mon-1800  . feeding supplement (PRO-STAT SUGAR FREE 64)  60 mL Per Tube BID  . FLUoxetine  20 mg Per Tube Daily  . free water  100 mL Per Tube Q4H  . insulin aspart  0-20 Units Subcutaneous Q4H  . insulin aspart  7 Units Subcutaneous Q4H  . insulin detemir  40 Units Subcutaneous BID  . mouth rinse  15 mL Mouth Rinse 10 times per day  . midodrine  5 mg Per Tube Q8H  . pantoprazole sodium  40 mg Per Tube Q1200  . sodium chloride flush  10-40 mL Intracatheter Q12H  . warfarin  5 mg Oral ONCE-1600  . Warfarin - Pharmacist Dosing Inpatient   Does not apply Katherine  06/08/2019, 2:41 PM

## 2019-06-09 DIAGNOSIS — J9601 Acute respiratory failure with hypoxia: Secondary | ICD-10-CM | POA: Diagnosis not present

## 2019-06-09 DIAGNOSIS — Z930 Tracheostomy status: Secondary | ICD-10-CM | POA: Diagnosis not present

## 2019-06-09 DIAGNOSIS — N179 Acute kidney failure, unspecified: Secondary | ICD-10-CM | POA: Diagnosis not present

## 2019-06-09 LAB — RENAL FUNCTION PANEL
Albumin: 2.5 g/dL — ABNORMAL LOW (ref 3.5–5.0)
Anion gap: 14 (ref 5–15)
BUN: 70 mg/dL — ABNORMAL HIGH (ref 6–20)
CO2: 23 mmol/L (ref 22–32)
Calcium: 8.6 mg/dL — ABNORMAL LOW (ref 8.9–10.3)
Chloride: 100 mmol/L (ref 98–111)
Creatinine, Ser: 3.39 mg/dL — ABNORMAL HIGH (ref 0.61–1.24)
GFR calc Af Amer: 26 mL/min — ABNORMAL LOW (ref >60)
GFR calc non Af Amer: 22 mL/min — ABNORMAL LOW (ref >60)
Glucose, Bld: 183 mg/dL — ABNORMAL HIGH (ref 70–99)
Phosphorus: 4.7 mg/dL — ABNORMAL HIGH (ref 2.5–4.6)
Potassium: 3.2 mmol/L — ABNORMAL LOW (ref 3.5–5.1)
Sodium: 137 mmol/L (ref 135–145)

## 2019-06-09 LAB — CBC
HCT: 29.3 % — ABNORMAL LOW (ref 39.0–52.0)
Hemoglobin: 8.8 g/dL — ABNORMAL LOW (ref 13.0–17.0)
MCH: 32.5 pg (ref 26.0–34.0)
MCHC: 30 g/dL (ref 30.0–36.0)
MCV: 108.1 fL — ABNORMAL HIGH (ref 80.0–100.0)
Platelets: 329 10*3/uL (ref 150–400)
RBC: 2.71 MIL/uL — ABNORMAL LOW (ref 4.22–5.81)
RDW: 21.7 % — ABNORMAL HIGH (ref 11.5–15.5)
WBC: 9.5 10*3/uL (ref 4.0–10.5)
nRBC: 0 % (ref 0.0–0.2)

## 2019-06-09 LAB — GLUCOSE, CAPILLARY
Glucose-Capillary: 137 mg/dL — ABNORMAL HIGH (ref 70–99)
Glucose-Capillary: 143 mg/dL — ABNORMAL HIGH (ref 70–99)
Glucose-Capillary: 152 mg/dL — ABNORMAL HIGH (ref 70–99)
Glucose-Capillary: 177 mg/dL — ABNORMAL HIGH (ref 70–99)
Glucose-Capillary: 180 mg/dL — ABNORMAL HIGH (ref 70–99)
Glucose-Capillary: 194 mg/dL — ABNORMAL HIGH (ref 70–99)

## 2019-06-09 LAB — PROTIME-INR
INR: 1.8 — ABNORMAL HIGH (ref 0.8–1.2)
Prothrombin Time: 20.4 seconds — ABNORMAL HIGH (ref 11.4–15.2)

## 2019-06-09 MED ORDER — ALPRAZOLAM 0.5 MG PO TABS
0.5000 mg | ORAL_TABLET | Freq: Every evening | ORAL | Status: DC | PRN
  Administered 2019-06-09 – 2019-06-22 (×6): 0.5 mg via ORAL
  Filled 2019-06-09: qty 2
  Filled 2019-06-09 (×5): qty 1

## 2019-06-09 MED ORDER — WARFARIN SODIUM 5 MG PO TABS
5.0000 mg | ORAL_TABLET | Freq: Once | ORAL | Status: AC
  Administered 2019-06-09: 5 mg via ORAL
  Filled 2019-06-09: qty 1

## 2019-06-09 NOTE — Progress Notes (Signed)
PT Cancellation Note  Patient Details Name: Carlos Brewer. MRN: [id number] DOB: 09-04-1985   Cancelled Treatment:    Reason Eval/Treat Not Completed: Fatigue/lethargy limiting ability to participate;Other (comment)(RN cleaning pt up after leak in flexi seal.) Recently finished with HD and is fatigued. 06/09/2019  Ginger Carne., PT Acute Rehabilitation Services [phone]  (pager) [phone]  (office)   Tessie Fass Adi Seales 06/09/2019, 2:16 PM

## 2019-06-09 NOTE — Progress Notes (Signed)
ANTICOAGULATION CONSULT NOTE  Pharmacy Consult:  Bivalirudin >> Coumadin Indication: stroke 2/5, LV thrombus 2/8, HIT 2/15; sepsis  Allergies  Allergen Reactions  . Heparin Other (See Comments)    Heparin induced thrombocytopenia. 2/15 HIT OD 1.692. 2/16 SRA positive-90.     Patient Measurements: Height: 6\' 1"  (185.4 cm) Weight: 239 lb 6.7 oz (108.6 kg) IBW/kg (Calculated) : 79.9   Vital Signs: Temp: 99.2 F (37.3 C) (03/25 0755) Temp Source: Oral (03/25 0755) BP: 94/49 (03/25 0930) Pulse Rate: 100 (03/25 0930)  Labs: Recent Labs    06/07/19 0411 06/07/19 0411 06/08/19 0500 06/08/19 0500 06/08/19 0611 06/08/19 0808 06/09/19 0647 06/09/19 0750  HGB 7.6*   < > 9.0*   < > 9.2*  --   --  8.8*  HCT 24.8*   < > 28.6*  --  27.0*  --   --  29.3*  PLT 241  --  331  --   --   --   --  329  LABPROT 21.3*  --   --   --   --  18.2* 20.4*  --   INR 1.9*  --   --   --   --  1.5* 1.8*  --   CREATININE 4.39*  --  2.87*  --   --   --   --  3.39*   < > = values in this interval not displayed.    Estimated Creatinine Clearance: 39.3 mL/min (A) (by C-G formula based on SCr of 3.39 mg/dL (H)).  Assessment: 34 yr old male with history of ESRD on HD presented with left MCA infarct - received TPA and revascularization with IR on 2/5. On 2/8 found to have small LV apical thrombus on ECHO. Pharmacy consulted to dose IV heparin, which was switched to bivalirudin 2/15 for HIT. 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 SRA very positive at 7. Heparin allergy has appropriately been added to chart and updated with SRA results. Transitioned off bivalrudin on 3/14. Now on warfarin alone.  On 3/19, patient had supratherapeutic INRs of 4>5>4.7. INR elevation may have been caused by drug interaction with amiodarone or combination of this, tube feeds being held and restarted at lower rate, and false elevation due to hemodialysis.   INR is slightly subtherapeutic today at  1.8, up from 1.5 yesterday. Patient's tube feed rate was increased from 20 ml/hr to 60 ml/hr, which accounts for the initial drop in INR. Hgb is down to 8.8; patient has anemia of ESRD and last received Aranesp on 3/22. No bleeding reported. Dose warfarin cautiously and assess tube feed intake regularly. Will hold off on restarting bivalirudin for bridging since INR is very close to goal.   Goal of Therapy:  INR 2-3 Monitor platelets by anticoagulation protocol: Yes   Plan:  - Give warfarin 5 mg PO x1 this evening - Monitor daily INR, DDI with amiodarone, s/sx of bleeding

## 2019-06-09 NOTE — Progress Notes (Signed)
Occupational Therapy Treatment Patient Details Name: Carlos Brewer. MRN: [id number] DOB: 1985-05-25 Today's Date: 06/09/2019    History of present illness Pt is a 34 y/o male smoker who initially presented on 2/5 with slurred speech and Rt sided weakness. Admitted with left MCA CVA and multiple smaller embolic infarcts s/p tPA and thrombectomy in IR.  Additionally found to have left ventricular  thrombus. Hospital course complicated by septic shock r/t HCAP, AKI requiring CRRT, polymorphic VT and cardiogenic shock. Echo EF < 20%. Pt with Intermittent pressor dependence, required tracheostomy. Tolerating trach collar 3/4, back on vent 3/5 with flash pulmonary edema.  Started IHD 3/10.   OT comments  Upon arrival, pt supine in bed and awake. Pt requiring Max A for bed mobility and then Mod-Max A for maintaining sitting balance. Pt performing sit<>stand at stedy x3; first and third time, pt requiring Max A+2 for power up and maintaining balance and then Min A for second sit<>stand from stedy seat. Pt with decreased following of cues and requiring significant time to engagement, process, and follow cues. Continue to recommend dc to post-acute rehab and will continue to follow acutely as admitted.    Follow Up Recommendations  LTACH;Supervision/Assistance - 24 hour    Equipment Recommendations  Other (comment)(TBD)    Recommendations for Other Services      Precautions / Restrictions Precautions Precautions: Fall Precaution Comments: trach collar/flexiseal Restrictions Weight Bearing Restrictions: No       Mobility Bed Mobility Overal bed mobility: Needs Assistance Bed Mobility: Rolling;Sidelying to Sit Rolling: Max assist Sidelying to sit: Max assist;+2 for physical assistance       General bed mobility comments: Max A to roll to right and then bring BLEs over EOB and elevate trunk  Transfers Overall transfer level: Needs assistance Equipment used: Ambulation equipment  used Transfers: Sit to/from Stand Sit to Stand: Max assist;Min assist;+2 physical assistance         General transfer comment: Max A +2 for first and third sit<>stand; pt standing with Min A from stedy seat without cues and maintaining standing during transfer to recliner.     Balance Overall balance assessment: Needs assistance Sitting-balance support: Bilateral upper extremity supported;Feet supported Sitting balance-Leahy Scale: Poor     Standing balance support: Bilateral upper extremity supported Standing balance-Leahy Scale: Poor Standing balance comment: Reliant on UE support                           ADL either performed or assessed with clinical judgement   ADL Overall ADL's : Needs assistance/impaired                         Toilet Transfer: Maximal assistance;+2 for physical assistance;+2 for safety/equipment;Minimal assistance(sit<>stand with stedy and transfer to recliner) Toilet Transfer Details (indicate cue type and reason): Max A +2 for first and third sit<>stand; pt standing with Min A from stedy seat without cues and maintaining standing during transfer to recliner.          Functional mobility during ADLs: Maximal assistance;Minimal assistance;+2 for physical assistance;+2 for safety/equipment(sit<>stand in [place]) General ADL Comments: Pt performing oral care at bed level and then BUE exercises. Increased engagment this session     Vision       Perception     Praxis      Cognition Arousal/Alertness: Awake/alert Behavior During Therapy: Flat affect Overall Cognitive Status: Impaired/Different from baseline Area of Impairment:  Attention;Following commands;Safety/judgement;Problem solving;Awareness                   Current Attention Level: Focused;Sustained   Following Commands: Follows one step commands inconsistently;Follows one step commands with increased time Safety/Judgement: Decreased awareness of  safety;Decreased awareness of deficits Awareness: Intellectual Problem Solving: Slow processing;Requires verbal cues;Decreased initiation General Comments: Pt with decreased following of simple commands; requiring significant amount of time. However, despite poor engagement and following commands, pt making increased amounts of eye contact.         Exercises     Shoulder Instructions       General Comments Mother arriving for second half of session. SpO2 in 90s on RA with PMV in place. RR 30s.     Pertinent Vitals/ Pain       Pain Assessment: Faces Faces Pain Scale: No hurt Pain Location: generalized Pain Descriptors / Indicators: Discomfort;Restless Pain Intervention(s): Monitored during session;Limited activity within patient's tolerance;Repositioned  Home Living                                          Prior Functioning/Environment              Frequency  Min 2X/week        Progress Toward Goals  OT Goals(current goals can now be found in the care plan section)  Progress towards OT goals: Progressing toward goals  Acute Rehab OT Goals Patient Stated Goal: to d/c home with mother after rehab OT Goal Formulation: With patient Time For Goal Achievement: 06/01/19 Potential to Achieve Goals: Good ADL Goals Pt Will Perform Grooming: with modified independence;sitting Pt Will Perform Upper Body Dressing: sitting;with supervision Pt/caregiver will Perform Home Exercise Program: Increased strength;Increased ROM;Both right and left upper extremity;With Supervision;With written HEP provided Additional ADL Goal #1: Pt will maintain sitting balance EOB >10 min at supervision level in prep for ADL. Additional ADL Goal #2: Pt will follow multi step commands with 75% accuracy during functional task completion. Additional ADL Goal #3: Pt will perform bed mobility with minguard assist as precursor to EOB/OOB ADL.  Plan Discharge plan remains appropriate     Co-evaluation                 AM-PAC OT "6 Clicks" Daily Activity     Outcome Measure   Help from another person eating meals?: Total Help from another person taking care of personal grooming?: A Lot Help from another person toileting, which includes using toliet, bedpan, or urinal?: Total Help from another person bathing (including washing, rinsing, drying)?: Total Help from another person to put on and taking off regular upper body clothing?: Total Help from another person to put on and taking off regular lower body clothing?: Total 6 Click Score: 7    End of Session Equipment Utilized During Treatment: Other (comment)(stedy)  OT Visit Diagnosis: Muscle weakness (generalized) (M62.81);Other abnormalities of gait and mobility (R26.89)   Activity Tolerance Patient tolerated treatment well;Patient limited by fatigue   Patient Left with call bell/phone within reach;in chair;with family/visitor present;with nursing/sitter in room   Nurse Communication Mobility status        Time: 3235-5732 OT Time Calculation (min): 28 min  Charges: OT General Charges $OT Visit: 1 Visit OT Treatments $Self Care/Home Management : 8-22 mins  [place], OTR/L Acute Rehab Pager: [phone] Office: [place] 06/09/2019,  3:56 PM

## 2019-06-09 NOTE — Progress Notes (Signed)
NAME:  Carlos Brewer., MRN:  [id number], DOB:  January 31, 1986, LOS: 56 ADMISSION DATE:  04/28/2019, CONSULTATION DATE:  05/06/2019 REFERRING MD:  Dr. Leonel Ramsay, CHIEF COMPLAINT:  Slurred speech  Brief History   34 yo male smoker found to have slurred speech and Rt sided weakness.  Admitted with left MCA CVA and multiple smaller embolic infarcts s/p tPA and thrombectomy in IR.  Additionally found to have left ventricular  thrombus.    Course complicated by septic shock r/t HCAP, AKI requiring CRRT, polymorphic VT and cardiogenic shock.  Intermittent pressor dependence, required tracheostomy  Past Medical History  Systolic CHF with non ischemic CM, Cocaine abuse, OSA, DM Hx of CHF (EF 15%)  Hx myocarditis Smoker 1/2 ppd   Significant Hospital Events   2/05 Admit, tPA, IR thrombectomy 2/6 100% on fvent at 2300  Overnight requiring increasing levophed and phenylephrine.    2/6: switched to levophed, epi, started antibiotics, started on CRRT.  2/9  developed wide-complex tachycardia and hypotension, started on amiodarone drip, increase Levophed drip 2/15 drop in platelets, heparin stopped, bival started 2/17 Hypotensive and back on Levophed 2/18 trach, lines changed 2/19 started milrinone , fever 103 2/20 Episode of wide-complex tachycardia yesterday, changed from Levophed to vasopressin 2/22 stopping vanc. Still on inotrope support. Some AF w/ RVR. Had to be placed back on pressors. ivabradine added 2/23 still on pressors/ CRRT continued 2/24 tmax 98.4, Remains on CRRT, even UF, remains anuric, Levophed at 14 mcg/min, Milrinone at 0.125 mcg/kg/min, Coox 63.4 Doing well this morning on ATC, no events overnight.  Midodrine added   2/25 more interactive, remains on ATC, tmax 99.5/ WBC 21.4, ~900 ml liquid stool/ 24 hours, neg Cdiff, Levophed up to 20 mcg/min, CVP 2, ,milrinone remains at 0.125 mcg/kg/min, coox 92.7, Off CRRT since last night ~2000 s/p clotted x 3 off citrate,  restarted  3/1 Amio drip restarted for WCT/atrial flutter, milrinone turned off 3/2 CRRT stopped, permacath placed 3/5 crrt resumed 3/6 started on pressors for crrt tolerance 3/8 No longer on CRRT or pressor support  3/12 Tolerated PS for >2 hours  3/17  febrile 106 overnight, shock on pressors, new lines placed, broad-spectrum antibiotics 3/22: Awake, appears comfortable on pressure support ventilation.  Currently weaning Precedex, he is back on low-dose norepinephrine 3/23 pm back on vent at hs for resp distress / desats ? chf on cxr    Consults:  Neuro IR Cardiology  Nephrology Heart Failure  EP   Procedures:  2/5-2/6:  TPA given at 428 am (total of 90 Mg) 520 am went to IR  S/P Lt common carotid arteriogram followed by complete revascularization of occluded LT MCA sup division mid M2 seg with x 1 pass with 13mx 40 mm solitaire X ret river device and penumbra aspiration with TICI 3 revascularization  ETT 2/05 >> 2/18 LIJ CVL 2/5 >>2/18  RT  CVL 2/18 >>3/2 RIJ HD cath 2/6 >>2/18 6 shiley cuffed trach 2/18  >>3/4  RT fem HD 2/19 >>3/2 RIJ permacath 3/2>> 3/4 shiley 4 uncuffed.... dislodged placed back 6cuffed shiley that evening 3/5: change to #6 cuffed distal XLT  3/17 right femoral CVL >> 3/23 3/17 right femoral arterial line >>3/23  Significant Diagnostic Tests:  CT angio head/neck 2/05 >> occlusion of Lt MCA bifurcation Echo 2/05 >> EF less than 20%, cannot rule out apical thrombus MRI brain 2/11 > extensive acute infarction of multiple areas without large or medium vessel occlusion Echocardiogram 2/8 Left ventricular ejection fraction,  by estimation, is <20%. The left  ventricle has severely decreased function. The left ventricle demonstrates  global hypokinesis. The left ventricular internal cavity size was severely  dilated. Possible small 0.8 x 0.6 cm apical thrombus.   3/16 duplex upper and lower extremities >>neg  3/17 head CT >.  New area of  hypoattenuation in the left pons , left frontal encephalomalacia related to old infarct  Micro Data:  SARS CoV2 PCR 2/05 >> negative Influenza PCR 2/05 >> negative ... Cdiff 2/25 >> neg Trach asp 3/11>> Few candida albicans BC 3/11>> 1 of 4 with Coag neg staph   resp 3/24 >>>  Antimicrobials:  mero 2/6 -2/12 Zosyn 2/6 , 2/19 >> 2/23 vanc 2/6 >> 2/9 , 2/19 >>2/22 Ceftriaxone 3/12> 3/16 3/17 vanc >> 3/19 3/17 Anidulafungin >> 3/19 3/17 cefepime >>  3/22   Scheduled Meds: . amiodarone  200 mg Per Tube BID  . ARIPiprazole  10 mg Per Tube Daily  . aspirin  81 mg Per Tube Daily  . atorvastatin  40 mg Per Tube q1800  . atropine  0.5 mg Intravenous Once  . chlorhexidine gluconate (MEDLINE KIT)  15 mL Mouth Rinse BID  . Chlorhexidine Gluconate Cloth  6 each Topical Daily  . darbepoetin (ARANESP) injection - NON-DIALYSIS  150 mcg Subcutaneous Q Mon-1800  . feeding supplement (PRO-STAT SUGAR FREE 64)  60 mL Per Tube BID  . FLUoxetine  20 mg Per Tube Daily  . free water  100 mL Per Tube Q4H  . insulin aspart  0-20 Units Subcutaneous Q4H  . insulin aspart  7 Units Subcutaneous Q4H  . insulin detemir  40 Units Subcutaneous BID  . mouth rinse  15 mL Mouth Rinse 10 times per day  . midodrine  5 mg Per Tube Q8H  . pantoprazole sodium  40 mg Per Tube Q1200  . sevelamer carbonate  2.4 g Per Tube TID WC  . sodium chloride flush  10-40 mL Intracatheter Q12H  . Warfarin - Pharmacist Dosing Inpatient   Does not apply q1600   Continuous Infusions: . sodium chloride Stopped (06/07/19 0938)  . sodium chloride Stopped (05/21/19 0940)  . anticoagulant sodium citrate 5 mL (06/01/19 0331)  . dexmedetomidine (PRECEDEX) IV infusion Stopped (06/08/19 0834)  . feeding supplement (VITAL 1.5 CAL) 1,000 mL (06/09/19 0617)   PRN Meds:.sodium chloride, anticoagulant sodium citrate, bisacodyl, docusate, fentaNYL (SUBLIMAZE) injection, lip balm, loperamide HCl, midazolam, ondansetron (ZOFRAN) IV, sodium  chloride flush, zolpidem   Interim history/subjective:   Afebrile On trach collar since 3/20 4 AM. Minimal urine output with Lasix  Objective   Blood pressure 125/87, pulse (!) 107, temperature 99.2 F (37.3 C), temperature source Oral, resp. rate (!) 31, height 6' 1"  (1.854 m), weight 108.6 kg, SpO2 92 %.    FiO2 (%):  [28 %] 28 %   Intake/Output Summary (Last 24 hours) at 06/09/2019 0842 Last data filed at 06/09/2019 0700 Gross per 24 hour  Intake 2177.65 ml  Output 350 ml  Net 1827.65 ml   Filed Weights   06/07/19 1240 06/07/19 1625 06/09/19 0755  Weight: 110 kg 110 kg 108.6 kg     Physical Exam: Young man, chronically ill-appearing, no distress, on trach collar No jvd/mild pallor, no icterus Neck supple, no JVD, trach site with dried green secretions Lungs decreased breath sounds right basal crackles, no accessory muscle use RRR no s3 or or sign murmur , right permacath exit site appears clean Abd obese nl excursion  Extr warm  with 1+ pitting edema      Chest x-ray 3/24 reviewed which shows new multifocal infiltrates?  Asymmetric edema versus pneumonia     Resolved Hospital Problem list   HCAP 3/61, Acute metabolic encephalopathy 2nd to hypoxia and renal failure, Cardiogenic shock  Assessment & Plan:   Circulatory shock, this is been recurrent in nature.  Has underlying cardiomyopathy but more recently complicated by recurrent sepsis in the setting of what was felt to be aspiration pneumonia Plan Ct midodrine added 3/22       Chronic systolic CHF, non-ischemic Polymorphic VT. Apical thrombus in left ventricle. HLD. Plan Continuing aspirin, Lipitor, and amiodarone Telemetry At some point, if blood pressure permits, trial of hydralazine and nitrates  Acute toxic metabolic encephalopathy/Uremic encephalopathy Hyperactive delirium  Plan Continue Abilify, instead of Seroquel due to drug interactions,  continue Prozac and Ambien  Aggressive PT   Acute  hypoxic, hypercapnic respiratory failure with compromised airway in setting of CVA.  Complicated by recurrent aspiration and failure to wean from vent s/p tracheostomy.  -Unfortunately it seems as though his cardiomyopathy/renal failure also playing a role in liberating him from ventilator Plan Trach collar as tolerated, since 3/24 Resume PM valve  Lt MCA CVA likely embolic. Left pontine CVA, ?new noted on 3/17 Hx of cocaine abuse, depression. Plan Continuing supportive care   AKI from ATN in setting of hypoxia, cardiogenic shock. Off CVVH since 3/7 , tolerating IHD since 3/10 Lab Results  Component Value Date   CREATININE 2.87 (H) 06/08/2019   CREATININE 4.39 (H) 06/07/2019   CREATININE 4.26 (H) 06/06/2019  Plan  per renal >> no response to diuresis 3/24 Hemodialysis today with planned removal of 3 L Permanent access not planned due to poor overall prognosis  Fluid and electrolyte imbalance/acid-base imbalance: Hypokalemia, anion gap metabolic acidosis Plan >>>  Need to be careful with KCl supplements  if not dialyzing         HIT: Platelets have  normalized Lab Results  Component Value Date   PLT 329 06/09/2019   PLT 331 06/08/2019   PLT 241 06/07/2019    Plan Continuing warfarin per pharmacy    Anemia of critical illness no evidence of bleeding   Lab Results  Component Value Date   HGB 8.8 (L) 06/09/2019   HGB 9.2 (L) 06/08/2019   HGB 9.0 (L) 06/08/2019    Plan Trend CBC Transfuse for hemoglobin less than 7 or if vol needed for low bp again given that he has chf     DM type II poorly controlled with hyperglycemia. Plan Continue Levemir and sliding scale insulin  Discussed with renal / try diuresis  if possible prior to resuming HD but low threshold to re-start vent esp at hs to rest    Pumonary infiltrates/ recurrent 3/24 ? Chf/ pna/amiodarone toxicity  >>> Unfortunately he does need amiodarone given his wide-complex tachycardia. Await repeat  respiratory culture with low threshold to start antibiotics if fever/leukocytosis   The patient is critically ill with multiple organ systems failure and requires high complexity decision making for assessment and support, frequent evaluation and titration of therapies, application of advanced monitoring technologies and extensive interpretation of multiple databases. Critical Care Time devoted to patient care services described in this note independent of APP/resident  time is 31 minutes.   [name] MD. [place]. [facility]  If no response to pager , please call [phone]   06/09/2019

## 2019-06-09 NOTE — Progress Notes (Signed)
Nutrition Follow-up  DOCUMENTATION CODES:   Obesity unspecified  INTERVENTION:   Vital 1.5 @ 60 ml/hr (1440 ml/day) via Cortrak tube 60 ml Prostat BID  100 ml every 4 hours: 600 ml   Provides: 2560 kcal, 157 grams protein, and 1700 ml free water.    NUTRITION DIAGNOSIS:   Inadequate oral intake related to inability to eat as evidenced by NPO status. Ongoing.   GOAL:   Patient will meet greater than or equal to 90% of their needs Meeting with TF.   MONITOR:   TF tolerance  REASON FOR ASSESSMENT:   Ventilator   ASSESSMENT:   Pt with PMH of CHF, OSA, poorly controlled DM, cocaine abuse admitted with L MCA stroke s/p tPA and IR thrombectomy.   Renal following, pt now on iHD for renal clearance. Pt is having increased UOP and iHD has not been pulling fluid per notes however plan is for UF today.   Pt had been on trach collar > 48 hours but back on vent 3/24 due to increased WOB, agitation, and desaturation overnight. Now back on TC.   2/6 started on CRRT  2/18 s/p trach placement 3/10 CRRT stopped 3/12 iHD 3/17 febrile; developed septic shock on pressors, lines changed, on abx  3/18 Cortrak vomited out, cortrak replaced   Medications reviewed and include:  7 units novolog every 4 hours, 40 units levemir BID, renvela TID   Precedex  Labs reviewed: BUN: 70  Cr: 3.39 (H), K+ 3.2 (L), PO4: 4.7 CBGs:226-194-143   UOP: 240 ml x 24 hrs  UF: 0 - 3/23  Diet Order:   Diet Order    None      EDUCATION NEEDS:   No education needs have been identified at this time  Skin:  Skin Assessment: Skin Integrity Issues: Skin Integrity Issues:: DTI DTI: L heel Stage II: NA Other: skin tear to coccyx and penis  Last BM:  110 ml via rectal tube  Height:   Ht Readings from Last 1 Encounters:  05/11/2019 6\' 1"  (1.854 m)    Weight:   Wt Readings from Last 1 Encounters:  06/09/19 108.6 kg    Ideal Body Weight:  83.6 kg  BMI:  Body mass index is 31.59  kg/m.  Estimated Nutritional Needs:   Kcal:  2500-2700  Protein:  150-170 grams  Fluid:  2 L/day  Lockie Pares., RD, LDN, CNSC See AMiON for contact information

## 2019-06-09 NOTE — Progress Notes (Signed)
[facility] Progress Note    Assessment/ Plan:   1.  AKIpresumably due to ATN with ischemic/nephrotoxic insults worsened by cardiogenic shock. Started on CRRT 04/23/19-  3/7.   Tolerating IHD since 3/10.  Have not done perm access due to poor prognosis.  Last HD 3/25, UF ~2.5L.  Was having some ^ UOP this week but trailed off yesterday.  Plan next HD Sat.      2.Acute stroke- presumed embolic with bilateral infarcts and LV thrombus- received tpa on admission  3. SVT/polymorphic VT- per Cardiology, has not been issue recently; on oral amiodarone now.  4. Acute hypoxic respiratory failure- s/p trach--> tol TC in past few days but briefly on vent yesterday.  HD with UF today as c/f pulmonary edema on CX  5. 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 HIT Ab+, s/p bivalirudin and now on warfarin alone  6. HTN/vol: on and off pressors, on midodrine too; UF 2.5L today.    7. Anemia-   iron stores low, got feraheme on 3/9 and 3/12,  - also  giving ESA - darbe 100 per week > increase dose for 3/22 to 150 given Hb in 8s.   8. BMM-  Phos was ok then  9.6 on 3/17 -  Now 8.3. sevelamer 800 TID  9. Disposition- overall prognosis is poor with ongoing RRT , trach, as well as cardiogenic shock/cardiomyopathy.  LTACH seems the most likely option  Subjective:    Back on vent briefly yesterday, CXR with some edema but then tolerated TC all afternoon and night.  UOP marginal with high dose diuretics.  HD this AM - looks like UF will be about 2.5L.  Objective:   BP 108/87   Pulse (!) 101   Temp 98 F (36.7 C) (Oral)   Resp (!) 24   Ht 6' 1"  (1.854 m)   Wt 105 kg   SpO2 100%   BMI 30.54 kg/m   Intake/Output Summary (Last 24 hours) at 06/09/2019 1344 Last data filed at 06/09/2019 1300 Gross per 24 hour  Intake 1590 ml  Output 3350 ml  Net -1760 ml   Weight change:   Physical Exam: Gen: awake calm HEENT - trach on TC  CVS: S1S2 no rub Resp:coarse BS, no wheezes or rales  appreciated Abd: soft/ND Ext:  no edema Access: rightIJtunneled catheter  Imaging: DG Chest Port 1 View  Result Date: 06/08/2019 CLINICAL DATA:  Tachypnea EXAM: PORTABLE CHEST 1 VIEW COMPARISON:  June 01, 2019 FINDINGS: The heart size and mediastinal contours are unchanged with cardiomegaly. There is new multifocal patchy/fluffy airspace opacities, prominently within the right upper lobe and throughout the left lung. No definite pleural effusion. ET tube is 1.5 cm above the carina. NG tube is seen coursing below the diaphragm. A right-sided central venous catheter with the tip at the superior cavoatrial junction. IMPRESSION: Interval development of multifocal patchy/fluffy airspace opacities, left greater than right. This could be due to asymmetric edema and/or multifocal pneumonia. Electronically Signed   By: Prudencio Pair M.D.   On: 06/08/2019 05:46    Labs: BMET Recent Labs  Lab 06/03/19 2231 06/03/19 2242 06/04/19 0500 06/05/19 0616 06/06/19 0500 06/07/19 0411 06/07/19 1430 06/08/19 0500 06/08/19 0611 06/09/19 0750  NA 137   < > 137 137 140 139  --  137 137 137  K 2.9*   < > 3.0* 3.3* 3.3* 3.0*  --  4.5 3.1* 3.2*  CL 99  --  97* 102 104 106  --  100  --  100  CO2 21*  --  22 18* 18* 18*  --  20*  --  23  GLUCOSE 165*  --  98 137* 111* 129*  --  186*  --  183*  BUN 38*  --  46* 66* 86* 105*  --  49*  --  70*  CREATININE 3.78*  --  4.24* 4.95* 4.26* 4.39*  --  2.87*  --  3.39*  CALCIUM 8.6*  --  8.7* 8.8* 9.1 8.7*  --  8.3*  --  8.6*  PHOS  --   --   --   --   --   --  8.3*  --   --  4.7*   < > = values in this interval not displayed.   CBC Recent Labs  Lab 06/06/19 0500 06/06/19 0500 06/07/19 0411 06/08/19 0500 06/08/19 0611 06/09/19 0750  WBC 8.4  --  8.1 13.6*  --  9.5  HGB 8.0*   < > 7.6* 9.0* 9.2* 8.8*  HCT 26.6*   < > 24.8* 28.6* 27.0* 29.3*  MCV 106.8*  --  105.5* 104.4*  --  108.1*  PLT 225  --  241 331  --  329   < > = values in this interval not  displayed.    Medications:    . amiodarone  200 mg Per Tube BID  . ARIPiprazole  10 mg Per Tube Daily  . aspirin  81 mg Per Tube Daily  . atorvastatin  40 mg Per Tube q1800  . atropine  0.5 mg Intravenous Once  . chlorhexidine gluconate (MEDLINE KIT)  15 mL Mouth Rinse BID  . Chlorhexidine Gluconate Cloth  6 each Topical Daily  . darbepoetin (ARANESP) injection - NON-DIALYSIS  150 mcg Subcutaneous Q Mon-1800  . feeding supplement (PRO-STAT SUGAR FREE 64)  60 mL Per Tube BID  . FLUoxetine  20 mg Per Tube Daily  . free water  100 mL Per Tube Q4H  . insulin aspart  0-20 Units Subcutaneous Q4H  . insulin aspart  7 Units Subcutaneous Q4H  . insulin detemir  40 Units Subcutaneous BID  . mouth rinse  15 mL Mouth Rinse 10 times per day  . midodrine  5 mg Per Tube Q8H  . pantoprazole sodium  40 mg Per Tube Q1200  . sevelamer carbonate  2.4 g Per Tube TID WC  . sodium chloride flush  10-40 mL Intracatheter Q12H  . warfarin  5 mg Oral ONCE-1600  . Warfarin - Pharmacist Dosing Inpatient   Does not apply [place]  06/09/2019, 1:44 PM

## 2019-06-09 NOTE — Progress Notes (Signed)
OT Cancellation Note  Patient Details Name: Carlos Brewer. MRN: [id number] DOB: [birth year]   Cancelled Treatment:    Reason Eval/Treat Not Completed: Other (comment)(RN cleaning pt up after leak in flexi seal.) Recently finishing dialysis and fatigued. Will return as schedule allows.   [place], OTR/L Acute Rehab Pager: [phone] Office: [phone] 06/09/2019, 1:28 PM

## 2019-06-09 NOTE — Progress Notes (Signed)
Physical Therapy Treatment Patient Details Name: Carlos Brewer. MRN: [id number] DOB: 1985-12-25 Today's Date: 06/09/2019    History of Present Illness Pt is a 34 y/o male smoker who initially presented on 2/5 with slurred speech and Rt sided weakness. Admitted with left MCA CVA and multiple smaller embolic infarcts s/p tPA and thrombectomy in IR.  Additionally found to have left ventricular  thrombus. Hospital course complicated by septic shock r/t HCAP, AKI requiring CRRT, polymorphic VT and cardiogenic shock. Echo EF < 20%. Pt with Intermittent pressor dependence, required tracheostomy. Tolerating trach collar 3/4, back on vent 3/5 with flash pulmonary edema.  Started IHD 3/10.    PT Comments    Pt was notably fatigued hours after coming out of HD.  Pt agree through his actions to participate and worked on transitions, sitting balance, sit to stand into the STEDY x3 before sitting up in the recliner for a while.  Pt was completely spent after the session.    Follow Up Recommendations  LTACH     Equipment Recommendations  Other (comment)    Recommendations for Other Services       Precautions / Restrictions Precautions Precautions: Fall Precaution Comments: trach collar/flexiseal Restrictions Weight Bearing Restrictions: No    Mobility  Bed Mobility Overal bed mobility: Needs Assistance Bed Mobility: Rolling;Sidelying to Sit Rolling: Max assist Sidelying to sit: Max assist;+2 for physical assistance       General bed mobility comments: Max A to roll to right and then bring BLEs over EOB and elevate trunk  Transfers Overall transfer level: Needs assistance Equipment used: Ambulation equipment used Transfers: Sit to/from Stand Sit to Stand: Max assist;Min assist;+2 physical assistance         General transfer comment: Max A +2 for first and third sit<>stand; pt standing with Min A from stedy seat without cues and maintaining standing during transfer to  recliner.   Ambulation/Gait                 Stairs             Wheelchair Mobility    Modified Rankin (Stroke Patients Only)       Balance Overall balance assessment: Needs assistance Sitting-balance support: Bilateral upper extremity supported;Feet supported Sitting balance-Leahy Scale: Poor     Standing balance support: Bilateral upper extremity supported Standing balance-Leahy Scale: Poor Standing balance comment: Reliant on UE support:   Stood x3 while in the STEDY.  Pt was quick to fatigue completely out with little ability to assist on the 3rd trial.                            Cognition Arousal/Alertness: Awake/alert Behavior During Therapy: Flat affect Overall Cognitive Status: Impaired/Different from baseline Area of Impairment: Attention;Following commands;Safety/judgement;Problem solving;Awareness                   Current Attention Level: Focused;Sustained   Following Commands: Follows one step commands inconsistently;Follows one step commands with increased time Safety/Judgement: Decreased awareness of safety;Decreased awareness of deficits Awareness: Intellectual Problem Solving: Slow processing;Requires verbal cues;Decreased initiation General Comments: Pt with decreased following of simple commands; requiring significant amount of time. However, despite poor engagement and following commands, pt making increased amounts of eye contact.       Exercises      General Comments General comments (skin integrity, edema, etc.): Mother arriving for second half of session. SpO2 in 90s on RA with  PMV in place. RR 30s.       Pertinent Vitals/Pain Pain Assessment: Faces Faces Pain Scale: No hurt Pain Location: generalized Pain Descriptors / Indicators: Discomfort;Restless Pain Intervention(s): Monitored during session    Home Living                      Prior Function            PT Goals (current goals can now be  found in the care plan section) Acute Rehab PT Goals Patient Stated Goal: to d/c home with mother after rehab PT Goal Formulation: With patient/family Time For Goal Achievement: 06/20/19 Potential to Achieve Goals: Fair Progress towards PT goals: Progressing toward goals(from downturn due to uremia)    Frequency    Min 3X/week      PT Plan Current plan remains appropriate    Co-evaluation PT/OT/SLP Co-Evaluation/Treatment: Yes   PT goals addressed during session: Mobility/safety with mobility OT goals addressed during session: Strengthening/ROM;ADL's and self-care      AM-PAC PT "6 Clicks" Mobility   Outcome Measure  Help needed turning from your back to your side while in a flat bed without using bedrails?: Total Help needed moving from lying on your back to sitting on the side of a flat bed without using bedrails?: Total Help needed moving to and from a bed to a chair (including a wheelchair)?: Total Help needed standing up from a chair using your arms (e.g., wheelchair or bedside chair)?: Total Help needed to walk in hospital room?: Total Help needed climbing 3-5 steps with a railing? : Total 6 Click Score: 6    End of Session   Activity Tolerance: Patient limited by fatigue;Patient tolerated treatment well Patient left: in chair;with call bell/phone within reach;with family/visitor present;with nursing/sitter in room;Other (comment) Nurse Communication: Mobility status PT Visit Diagnosis: Other abnormalities of gait and mobility (R26.89);Other symptoms and signs involving the nervous system (R29.898);Muscle weakness (generalized) (M62.81)     Time: 8841-6606 PT Time Calculation (min) (ACUTE ONLY): 28 min  Charges:  $Therapeutic Activity: 8-22 mins                     06/09/2019  Ginger Carne., PT Acute Rehabilitation Services [phone]  (pager) [phone]  (office)   Tessie Fass Merwyn Hodapp 06/09/2019, 4:31 PM

## 2019-06-10 DIAGNOSIS — J9601 Acute respiratory failure with hypoxia: Secondary | ICD-10-CM | POA: Diagnosis not present

## 2019-06-10 DIAGNOSIS — I639 Cerebral infarction, unspecified: Secondary | ICD-10-CM | POA: Diagnosis not present

## 2019-06-10 DIAGNOSIS — Z930 Tracheostomy status: Secondary | ICD-10-CM | POA: Diagnosis not present

## 2019-06-10 DIAGNOSIS — N179 Acute kidney failure, unspecified: Secondary | ICD-10-CM | POA: Diagnosis not present

## 2019-06-10 LAB — GLUCOSE, CAPILLARY
Glucose-Capillary: 112 mg/dL — ABNORMAL HIGH (ref 70–99)
Glucose-Capillary: 149 mg/dL — ABNORMAL HIGH (ref 70–99)
Glucose-Capillary: 178 mg/dL — ABNORMAL HIGH (ref 70–99)
Glucose-Capillary: 122 mg/dL — ABNORMAL HIGH (ref 70–99)
Glucose-Capillary: 119 mg/dL — ABNORMAL HIGH (ref 70–99)

## 2019-06-10 LAB — CBC
HCT: 31.9 % — ABNORMAL LOW (ref 39.0–52.0)
Hemoglobin: 9.8 g/dL — ABNORMAL LOW (ref 13.0–17.0)
MCH: 31.8 pg (ref 26.0–34.0)
MCHC: 30.7 g/dL (ref 30.0–36.0)
MCV: 103.6 fL — ABNORMAL HIGH (ref 80.0–100.0)
Platelets: 323 10*3/uL (ref 150–400)
RBC: 3.08 MIL/uL — ABNORMAL LOW (ref 4.22–5.81)
RDW: 20.4 % — ABNORMAL HIGH (ref 11.5–15.5)
WBC: 8.9 10*3/uL (ref 4.0–10.5)
nRBC: 0 % (ref 0.0–0.2)

## 2019-06-10 LAB — COOXEMETRY PANEL
Carboxyhemoglobin: 3 % — ABNORMAL HIGH (ref 0.5–1.5)
Methemoglobin: 1 % (ref 0.0–1.5)
O2 Saturation: 75.4 %
Total hemoglobin: 10.4 g/dL — ABNORMAL LOW (ref 12.0–16.0)

## 2019-06-10 LAB — COMPREHENSIVE METABOLIC PANEL
ALT: 29 U/L (ref 0–44)
AST: 35 U/L (ref 15–41)
Albumin: 2.7 g/dL — ABNORMAL LOW (ref 3.5–5.0)
Alkaline Phosphatase: 162 U/L — ABNORMAL HIGH (ref 38–126)
Anion gap: 16 — ABNORMAL HIGH (ref 5–15)
BUN: 59 mg/dL — ABNORMAL HIGH (ref 6–20)
CO2: 22 mmol/L (ref 22–32)
Calcium: 9.4 mg/dL (ref 8.9–10.3)
Chloride: 97 mmol/L — ABNORMAL LOW (ref 98–111)
Creatinine, Ser: 3.42 mg/dL — ABNORMAL HIGH (ref 0.61–1.24)
GFR calc Af Amer: 25 mL/min — ABNORMAL LOW (ref >60)
GFR calc non Af Amer: 22 mL/min — ABNORMAL LOW (ref >60)
Glucose, Bld: 188 mg/dL — ABNORMAL HIGH (ref 70–99)
Potassium: 3.8 mmol/L (ref 3.5–5.1)
Sodium: 135 mmol/L (ref 135–145)
Total Bilirubin: 1.1 mg/dL (ref 0.3–1.2)
Total Protein: 7 g/dL (ref 6.5–8.1)

## 2019-06-10 LAB — APTT
aPTT: 53 seconds — ABNORMAL HIGH (ref 24–36)
aPTT: 76 seconds — ABNORMAL HIGH (ref 24–36)
aPTT: 52 seconds — ABNORMAL HIGH (ref 24–36)

## 2019-06-10 LAB — PROTIME-INR
INR: 1.6 — ABNORMAL HIGH (ref 0.8–1.2)
Prothrombin Time: 19.3 seconds — ABNORMAL HIGH (ref 11.4–15.2)

## 2019-06-10 LAB — CULTURE, RESPIRATORY W GRAM STAIN
Culture: NORMAL
Gram Stain: NONE SEEN
Special Requests: NORMAL

## 2019-06-10 LAB — SEDIMENTATION RATE: Sed Rate: 121 mm/hr — ABNORMAL HIGH (ref 0–16)

## 2019-06-10 MED ORDER — SODIUM CHLORIDE 0.9 % IV SOLN
0.0500 mg/kg/h | INTRAVENOUS | Status: DC
  Administered 2019-06-10: 0.06 mg/kg/h via INTRAVENOUS
  Administered 2019-06-12: 0.095 mg/kg/h via INTRAVENOUS
  Administered 2019-06-13: 0.085 mg/kg/h via INTRAVENOUS
  Administered 2019-06-16 – 2019-06-19 (×3): 0.05 mg/kg/h via INTRAVENOUS
  Filled 2019-06-10 (×7): qty 250

## 2019-06-10 MED ORDER — WARFARIN SODIUM 5 MG PO TABS
5.0000 mg | ORAL_TABLET | Freq: Once | ORAL | Status: AC
  Administered 2019-06-10: 5 mg via ORAL
  Filled 2019-06-10: qty 1

## 2019-06-10 NOTE — Progress Notes (Signed)
ANTICOAGULATION CONSULT NOTE  Pharmacy Consult:  Bivalirudin  Indication: stroke 2/5, LV thrombus 2/8, HIT 2/15; sepsis  Allergies  Allergen Reactions  . Heparin Other (See Comments)    Heparin induced thrombocytopenia. 2/15 HIT OD 1.692. 2/16 SRA positive-90.     Patient Measurements: Height: 6\' 1"  (185.4 cm) Weight: 231 lb 7.7 oz (105 kg) IBW/kg (Calculated) : 79.9   Vital Signs: Temp: 98.5 F (36.9 C) (03/26 1940) Temp Source: Oral (03/26 1940) BP: 115/85 (03/26 1940) Pulse Rate: 102 (03/26 1940)  Labs: Recent Labs    06/08/19 0500 06/08/19 0500 06/08/19 4967 06/08/19 5916 06/08/19 0808 06/09/19 0647 06/09/19 0750 06/10/19 0618 06/10/19 1157 06/10/19 1408 06/10/19 1907  HGB 9.0*   < > 9.2*   < >  --   --  8.8*  --  9.8*  --   --   HCT 28.6*   < > 27.0*  --   --   --  29.3*  --  31.9*  --   --   PLT 331  --   --   --   --   --  329  --  323  --   --   APTT  --   --   --   --   --   --   --   --  53* 76* 52*  LABPROT  --   --   --   --  18.2* 20.4*  --  19.3*  --   --   --   INR  --   --   --   --  1.5* 1.8*  --  1.6*  --   --   --   CREATININE 2.87*  --   --   --   --   --  3.39*  --  3.42*  --   --    < > = values in this interval not displayed.    Estimated Creatinine Clearance: 38.3 mL/min (A) (by C-G formula based on SCr of 3.42 mg/dL (H)).  Assessment:  Anticoagulation: Small LV apical thrombus, HIT. APTT 52 in goal.  Goal of Therapy:  INR 2-3 APTT 50-65 seconds Monitor platelets by anticoagulation protocol: Yes   Plan:  - Con't Bivalirudin infusion 0.05 mg/kg/hr - Order CBC, Bmet, aPTT - Monitor aPTT q4hr until two therapeutic levels then daily    [place] S. Alford Highland, PharmD, BCPS Clinical Staff Pharmacist [email or web address]

## 2019-06-10 NOTE — Progress Notes (Addendum)
NAME:  Carlos Brewer., MRN:  [id number], DOB:  02-08-1986, LOS: 9 ADMISSION DATE:  05/04/2019, CONSULTATION DATE:  04/21/2019 REFERRING MD:  Dr. Leonel Ramsay, CHIEF COMPLAINT:  Slurred speech  Brief History   34 yo male smoker found to have slurred speech and Rt sided weakness.  Admitted with left MCA CVA and multiple smaller embolic infarcts s/p tPA and thrombectomy in IR.  Additionally found to have left ventricular  thrombus.    Course complicated by septic shock r/t HCAP, AKI requiring CRRT, polymorphic VT and cardiogenic shock.  Intermittent pressor dependence, required tracheostomy  Past Medical History  Systolic CHF with non ischemic CM, Cocaine abuse, OSA, DM Hx of CHF (EF 15%)  Hx myocarditis Smoker 1/2 ppd   Significant Hospital Events   2/05 Admit, tPA, IR thrombectomy 2/6 100% on fvent at 2300  Overnight requiring increasing levophed and phenylephrine.    2/6: switched to levophed, epi, started antibiotics, started on CRRT.  2/9  developed wide-complex tachycardia and hypotension, started on amiodarone drip, increase Levophed drip 2/15 drop in platelets, heparin stopped, bival started 2/17 Hypotensive and back on Levophed 2/18 trach, lines changed 2/19 started milrinone , fever 103 2/20 Episode of wide-complex tachycardia yesterday, changed from Levophed to vasopressin 2/22 stopping vanc. Still on inotrope support. Some AF w/ RVR. Had to be placed back on pressors. ivabradine added 2/23 still on pressors/ CRRT continued 2/24 tmax 98.4, Remains on CRRT, even UF, remains anuric, Levophed at 14 mcg/min, Milrinone at 0.125 mcg/kg/min, Coox 63.4 Doing well this morning on ATC, no events overnight.  Midodrine added   2/25 more interactive, remains on ATC, tmax 99.5/ WBC 21.4, ~900 ml liquid stool/ 24 hours, neg Cdiff, Levophed up to 20 mcg/min, CVP 2, ,milrinone remains at 0.125 mcg/kg/min, coox 92.7, Off CRRT since last night ~2000 s/p clotted x 3 off citrate,  restarted  3/1 Amio drip restarted for WCT/atrial flutter, milrinone turned off 3/2 CRRT stopped, permacath placed 3/5 crrt resumed 3/6 started on pressors for crrt tolerance 3/8 No longer on CRRT or pressor support  3/12 Tolerated PS for >2 hours  3/17  febrile 106 overnight, shock on pressors, new lines placed, broad-spectrum antibiotics 3/22: Awake, appears comfortable on pressure support ventilation.  Currently weaning Precedex, he is back on low-dose norepinephrine 3/23 pm back on vent at hs for resp distress / desats ? chf on cxr  3/24 t collar 24/7   Consults:  Neuro IR Cardiology  Nephrology Heart Failure  EP   Procedures:  2/5-2/6:  TPA given at 428 am (total of 90 Mg) 520 am went to IR  S/P Lt common carotid arteriogram followed by complete revascularization of occluded LT MCA sup division mid M2 seg with x 1 pass with 37mx 40 mm solitaire X ret river device and penumbra aspiration with TICI 3 revascularization  ETT 2/05 >> 2/18 LIJ CVL 2/5 >>2/18  RT Arthur CVL 2/18 >>3/2 RIJ HD cath 2/6 >>2/18 6 shiley cuffed trach 2/18  >>3/4  RT fem HD 2/19 >>3/2 RIJ permacath 3/2>> 3/4 shiley 4 uncuffed.... dislodged placed back 6cuffed shiley that evening 3/5: change to #6 cuffed distal XLT  3/17 right femoral CVL >> 3/23 3/17 right femoral arterial line >>3/23  Significant Diagnostic Tests:  CT angio head/neck 2/05 >> occlusion of Lt MCA bifurcation Echo 2/05 >> EF less than 20%, cannot rule out apical thrombus MRI brain 2/11 > extensive acute infarction of multiple areas without large or medium vessel occlusion Echocardiogram 2/8  Left ventricular ejection fraction, by estimation, is <20%. The left  ventricle has severely decreased function. The left ventricle demonstrates  global hypokinesis. The left ventricular internal cavity size was severely  dilated. Possible small 0.8 x 0.6 cm apical thrombus.   3/16 duplex upper and lower extremities >>neg  3/17 head CT >.   New area of hypoattenuation in the left pons , left frontal encephalomalacia related to old infarct  Micro Data:  SARS CoV2 PCR 2/05 >> negative Influenza PCR 2/05 >> negative ... Cdiff 2/25 >> neg Trach asp 3/11>> Few candida albicans BC 3/11>> 1 of 4 with Coag neg staph  BC 3/12 x 2 > neg  BC 3/17 x 2 > neg   trach 3/24 >>> no  wbc, no organisms final - nl flora   Antimicrobials:  mero 2/6 -2/12 Zosyn 2/6 , 2/19 >> 2/23 vanc 2/6 >> 2/9 , 2/19 >>2/22 Ceftriaxone 3/12> 3/16 3/17 vanc >> 3/19 3/17 Anidulafungin >> 3/19 3/17 cefepime >>  3/22   Scheduled Meds: . amiodarone  200 mg Per Tube BID  . ARIPiprazole  10 mg Per Tube Daily  . aspirin  81 mg Per Tube Daily  . atorvastatin  40 mg Per Tube q1800  . atropine  0.5 mg Intravenous Once  . chlorhexidine gluconate (MEDLINE KIT)  15 mL Mouth Rinse BID  . Chlorhexidine Gluconate Cloth  6 each Topical Daily  . darbepoetin (ARANESP) injection - NON-DIALYSIS  150 mcg Subcutaneous Q Mon-1800  . feeding supplement (PRO-STAT SUGAR FREE 64)  60 mL Per Tube BID  . FLUoxetine  20 mg Per Tube Daily  . free water  100 mL Per Tube Q4H  . insulin aspart  0-20 Units Subcutaneous Q4H  . insulin aspart  7 Units Subcutaneous Q4H  . insulin detemir  40 Units Subcutaneous BID  . mouth rinse  15 mL Mouth Rinse 10 times per day  . midodrine  5 mg Per Tube Q8H  . pantoprazole sodium  40 mg Per Tube Q1200  . sevelamer carbonate  2.4 g Per Tube TID WC  . sodium chloride flush  10-40 mL Intracatheter Q12H  . warfarin  5 mg Oral ONCE-1600  . Warfarin - Pharmacist Dosing Inpatient   Does not apply q1600   Continuous Infusions: . sodium chloride Stopped (06/07/19 0938)  . sodium chloride Stopped (05/21/19 0940)  . anticoagulant sodium citrate 5 mL (06/01/19 0331)  . bivalirudin (ANGIOMAX) infusion 0.5 mg/mL (Non-ACS indications)    . feeding supplement (VITAL 1.5 CAL) 1,000 mL (06/10/19 0248)   PRN Meds:.sodium chloride, ALPRAZolam, anticoagulant  sodium citrate, bisacodyl, docusate, fentaNYL (SUBLIMAZE) injection, lip balm, loperamide HCl, midazolam, ondansetron (ZOFRAN) IV, sodium chloride flush, zolpidem   Interim history/subjective:  Continuing to tol t collar 24/7 but no recovery of urine output     Objective   Blood pressure 103/75, pulse 100, temperature 98.9 F (37.2 C), temperature source Oral, resp. rate (!) 34, height 6' 1"  (1.854 m), weight 105 kg, SpO2 100 %.    FiO2 (%):  [21 %-28 %] 21 %   Intake/Output Summary (Last 24 hours) at 06/10/2019 1009 Last data filed at 06/10/2019 0900 Gross per 24 hour  Intake 1680 ml  Output 3550 ml  Net -1870 ml   Filed Weights   06/07/19 1625 06/09/19 0755 06/09/19 1132  Weight: 110 kg 108.6 kg 105 kg     Physical Exam:   Tmax 99 (no change) Pt alert, flat affect/ hob up 30 degrees on trach  collar and NO oxygen  No jvd L naris FT/ trach Lungs with minimal  exp > insp rhonchi bilaterally RRR no s3 or or sign murmur Abd obese with limited  excursion but no paradox or grunting as before  Extr warm with no edema or clubbing noted Neuro  Alert and slowly fc both sides better on L         Resolved Hospital Problem list   HCAP 0/16, Acute metabolic encephalopathy 2nd to hypoxia and renal failure, Cardiogenic shock HIT   Assessment & Plan:   Circulatory shock,  recurrent in nature.  Has underlying cardiomyopathy but more recently complicated by recurrent sepsis in the setting of what was felt to be aspiration pneumonia Plan Ct midodrine added 3/22       Chronic systolic CHF, non-ischemic Polymorphic VT. Apical thrombus in left ventricle. HLD. Plan Continuing aspirin, Lipitor, and amiodarone Telemetry At some point, if blood pressure permits, trial of hydralazine and nitrates >>> 3/26  added back bivalirudin as INR subtherapeutic for  2nd day   Acute toxic metabolic encephalopathy/Uremic encephalopathy Hyperactive delirium  Plan Continue Abilify, instead of  Seroquel due to drug interactions,  continue Prozac and Ambien  Aggressive PT   Acute hypoxic, hypercapnic respiratory failure with compromised airway in setting of CVA.  Complicated by recurrent aspiration and failure to wean from vent s/p tracheostomy.  -Unfortunately it seems as though his cardiomyopathy/renal failure also playing a role in liberating him from ventilator Plan Trach collar  since 3/24 not even requiring 02 at this point  Resume PM valve as tolerated   Lt MCA CVA likely embolic. Left pontine CVA, ?new noted on 3/17 Hx of cocaine abuse, depression. Plan Continuing supportive care   AKI from ATN in setting of hypoxia, cardiogenic shock. Off CVVH since 3/7 , tolerating IHD since 3/10 Plan  per renal >> no response to diuresis  See note 3/26   Permanent access not planned due to poor overall prognosis    Fluid and electrolyte imbalance/acid-base imbalance: Hypokalemia, anion gap metabolic acidosis Plan >>>  Need to be careful with KCl supplements  if not dialyzing       Anemia of critical illness no evidence of bleeding   Lab Results  Component Value Date   HGB 8.8 (L) 06/09/2019   HGB 9.2 (L) 06/08/2019   HGB 9.0 (L) 06/08/2019    Plan Trend CBC Transfuse for hemoglobin less than 7 or if vol needed for low bp again given that he has chf     DM type II poorly controlled with hyperglycemia. Plan Continue Levemir and sliding scale insulin      Pumonary infiltrates/ recurrent 3/24 ? Chf/ pna/amiodarone toxicity  >>> Unfortunately he does need amiodarone given his wide-complex tachycardia. Await repeat respiratory culture with low threshold to start antibiotics if fever/leukocytosis and in meantime renal pulling as neg as tolerated with sats ok on 3/26 on RA  >>>  Recheck cxr today as baseline >>>  check esr also as baseline though obviously non-specific in this setting    [facility] for neuro floor at this point and transfer back to [facility] am 3/27  .   Christinia Gully, MD Pulmonary and [place] [phone] After 6:00 PM or weekends, use Beeper [phone]  After 7:00 pm call Elink  [phone]

## 2019-06-10 NOTE — Progress Notes (Signed)
   Chart reviewed.  HF Team available over the weekend if needed.   Yolando Gillum NP-C  1:39 PM

## 2019-06-10 NOTE — Progress Notes (Signed)
Patient ID: Carlos Brewer., male   DOB: 1985-11-17, 34 y.o.   MRN: [id number] [facility] Progress Note   Assessment/ Plan:   1. Acute kidney Injury: This appears to be multifactorial with dense ATN following ischemic and nephrotoxic injury followed by cardiogenic shock.  Was started on CRRT on 04/23/2019 and recently transitioned to intermittent hemodialysis since 05/25/2019.  He remains anuric and without any evidence of renal recovery with next hemodialysis due tomorrow.  Overall prognosis is poor with sequelae of CVA and continued dialysis dependency. 2.  Acute CVA: Likely embolic with bilateral infarcts and LVEF thrombus for which he was treated with TPA on admission.  Significant residual sequelae. 3.  Hypoxic respiratory failure: Prolonged ventilator wean status post tracheostomy and now tolerating trach collar. 4.  Hypertension: On midodrine at this time with fair blood pressure control allowing HD. 5.  Anemia: Status post intravenous iron and now on ESA.  No overt loss.  Subjective:   Without acute events noted overnight and tolerated dialysis yesterday.   Objective:   BP 103/75   Pulse 100   Temp 98.9 F (37.2 C) (Oral)   Resp (!) 34   Ht 6' 1"  (1.854 m)   Wt 105 kg   SpO2 100%   BMI 30.54 kg/m   Intake/Output Summary (Last 24 hours) at 06/10/2019 0957 Last data filed at 06/10/2019 0900 Gross per 24 hour  Intake 1740 ml  Output 3550 ml  Net -1810 ml   Weight change:   Physical Exam: Gen: Appears comfortable resting in bed, on trach collar CVS: Pulse regular tachycardia, S1 and S2 normal.  Right IJ TDC Resp: Coarse/transmitted breath sounds bilaterally, no rales Abd: Soft, obese, nontender Ext: No lower extremity edema  Imaging: No results found.  Labs: BMET Recent Labs  Lab 06/03/19 2231 06/03/19 2242 06/04/19 0500 06/05/19 0616 06/06/19 0500 06/07/19 0411 06/07/19 1430 06/08/19 0500 06/08/19 0611 06/09/19 0750  NA 137   < > 137  137 140 139  --  137 137 137  K 2.9*   < > 3.0* 3.3* 3.3* 3.0*  --  4.5 3.1* 3.2*  CL 99  --  97* 102 104 106  --  100  --  100  CO2 21*  --  22 18* 18* 18*  --  20*  --  23  GLUCOSE 165*  --  98 137* 111* 129*  --  186*  --  183*  BUN 38*  --  46* 66* 86* 105*  --  49*  --  70*  CREATININE 3.78*  --  4.24* 4.95* 4.26* 4.39*  --  2.87*  --  3.39*  CALCIUM 8.6*  --  8.7* 8.8* 9.1 8.7*  --  8.3*  --  8.6*  PHOS  --   --   --   --   --   --  8.3*  --   --  4.7*   < > = values in this interval not displayed.   CBC Recent Labs  Lab 06/06/19 0500 06/06/19 0500 06/07/19 0411 06/08/19 0500 06/08/19 0611 06/09/19 0750  WBC 8.4  --  8.1 13.6*  --  9.5  HGB 8.0*   < > 7.6* 9.0* 9.2* 8.8*  HCT 26.6*   < > 24.8* 28.6* 27.0* 29.3*  MCV 106.8*  --  105.5* 104.4*  --  108.1*  PLT 225  --  241 331  --  329   < > = values in this interval not  displayed.    Medications:    . amiodarone  200 mg Per Tube BID  . ARIPiprazole  10 mg Per Tube Daily  . aspirin  81 mg Per Tube Daily  . atorvastatin  40 mg Per Tube q1800  . atropine  0.5 mg Intravenous Once  . chlorhexidine gluconate (MEDLINE KIT)  15 mL Mouth Rinse BID  . Chlorhexidine Gluconate Cloth  6 each Topical Daily  . darbepoetin (ARANESP) injection - NON-DIALYSIS  150 mcg Subcutaneous Q Mon-1800  . feeding supplement (PRO-STAT SUGAR FREE 64)  60 mL Per Tube BID  . FLUoxetine  20 mg Per Tube Daily  . free water  100 mL Per Tube Q4H  . insulin aspart  0-20 Units Subcutaneous Q4H  . insulin aspart  7 Units Subcutaneous Q4H  . insulin detemir  40 Units Subcutaneous BID  . mouth rinse  15 mL Mouth Rinse 10 times per day  . midodrine  5 mg Per Tube Q8H  . pantoprazole sodium  40 mg Per Tube Q1200  . sevelamer carbonate  2.4 g Per Tube TID WC  . sodium chloride flush  10-40 mL Intracatheter Q12H  . Warfarin - Pharmacist Dosing Inpatient   Does not apply q1600      Elmarie Shiley, MD 06/10/2019, 9:57 AM

## 2019-06-10 NOTE — Progress Notes (Signed)
ANTICOAGULATION CONSULT NOTE  Pharmacy Consult:  Bivalirudin >> Coumadin Indication: stroke 2/5, LV thrombus 2/8, HIT 2/15; sepsis  Allergies  Allergen Reactions  . Heparin Other (See Comments)    Heparin induced thrombocytopenia. 2/15 HIT OD 1.692. 2/16 SRA positive-90.     Patient Measurements: Height: 6\' 1"  (185.4 cm) Weight: 231 lb 7.7 oz (105 kg) IBW/kg (Calculated) : 79.9   Vital Signs: Temp: 98.9 F (37.2 C) (03/26 0800) Temp Source: Oral (03/26 0800) BP: 103/75 (03/26 0900) Pulse Rate: 100 (03/26 0900)  Labs: Recent Labs    06/08/19 0500 06/08/19 0500 06/08/19 0611 06/08/19 0808 06/09/19 0647 06/09/19 0750 06/10/19 0618  HGB 9.0*   < > 9.2*  --   --  8.8*  --   HCT 28.6*  --  27.0*  --   --  29.3*  --   PLT 331  --   --   --   --  329  --   LABPROT  --   --   --  18.2* 20.4*  --  19.3*  INR  --   --   --  1.5* 1.8*  --  1.6*  CREATININE 2.87*  --   --   --   --  3.39*  --    < > = values in this interval not displayed.    Estimated Creatinine Clearance: 38.7 mL/min (A) (by C-G formula based on SCr of 3.39 mg/dL (H)).  Assessment: 34 yr old male with history of ESRD on HD presented with left MCA infarct - received TPA and revascularization with IR on 2/5. On 2/8 found to have small LV apical thrombus on ECHO. Pharmacy consulted to dose IV heparin, which was switched to bivalirudin 2/15 for HIT. 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 SRA very positive at 56. Heparin allergy has appropriately been added to chart and updated with SRA results. Transitioned off bivalrudin on 3/14. Restarted bivalirudin 3/26.   On 3/19, patient had supratherapeutic INRs of 4>5>4.7. INR elevation may have been caused by drug interaction with amiodarone or combination of this, tube feeds being held and restarted at lower rate, and false elevation due to hemodialysis. INR has been subtherapeutic since 3/22, likely due to tube feed rate being increased  from 20 to 60 ml/hr.   INR is subtherapeutic today at 1.6, down from 1.9 yesterday. Initial aPTT on bivalirudin this morning was supratherapeutic at 76.  Hgb is up to 9.8; patient has anemia of ESRD and last received Aranesp on 3/22. No bleeding reported. Will restart bivalirudin to bridge to therapeutic INR on warfarin.  Per protocol, will stop bivalirudin when INR > 3.0 and check INR and aPTT 4-6 hr later.  1. If INR at goal (2-3)         a. If aPTT > 40: Assume prolonged bivalirudin clearance and             repeat aPTT and INR in 4-6 hrs.         b. If aPTT < 40: Begin warfarin alone 2. INR subtherapeutic: Resume bivalirudin   Goal of Therapy:  INR 2-3 APTT 50-65 seconds Monitor platelets by anticoagulation protocol: Yes   Plan:  - Give warfarin 5 mg PO x1 this evening - Restart bivalirudin infusion at 0.06 mg/kg/hr >> switch to 0.05 mg/kg/hr - Order CBC, Bmet, aPTT - Monitor aPTT q4hr until two therapeutic levels then daily - Monitor daily INR, DDI with amiodarone, s/sx of bleeding

## 2019-06-10 NOTE — Progress Notes (Signed)
Physical Therapy Treatment Patient Details Name: Carlos Brewer. MRN: [id number] DOB: [birth year] Today's Date: 06/10/2019    History of Present Illness Pt is a 34 y/o male smoker who initially presented on 2/5 with slurred speech and Rt sided weakness. Admitted with left MCA CVA and multiple smaller embolic infarcts s/p tPA and thrombectomy in IR.  Additionally found to have left ventricular  thrombus. Hospital course complicated by septic shock r/t HCAP, AKI requiring CRRT, polymorphic VT and cardiogenic shock. Echo EF < 20%. Pt with Intermittent pressor dependence, required tracheostomy. Tolerating trach collar 3/4, back on vent 3/5 with flash pulmonary edema.  Started IHD 3/10.    PT Comments    Pt less fatigued having a bye from HD.  Pt managed 3 stands in the STEDY, with the last 2 attaining full upright stance which has not happen since the uremia.  Pt left in the chair for sitting tolerance.    Follow Up Recommendations  LTACH     Equipment Recommendations  Other (comment)(TBA)    Recommendations for Other Services       Precautions / Restrictions Precautions Precautions: Fall Precaution Comments: trach collar/flexiseal Restrictions Weight Bearing Restrictions: No    Mobility  Bed Mobility Overal bed mobility: Needs Assistance Bed Mobility: Rolling;Sidelying to Sit Rolling: Max assist Sidelying to sit: Max assist;+2 for physical assistance       General bed mobility comments: Max A to roll to right and then bring BLEs over EOB and elevate trunk  Transfers Overall transfer level: Needs assistance Equipment used: Ambulation equipment used Transfers: Sit to/from Stand Sit to Stand: Max assist;+2 physical assistance         General transfer comment: Max assist to attain full upright positioning 2nd and 3rd trial out of 3.  Ambulation/Gait             General Gait Details: not able yet   Stairs             Wheelchair Mobility     Modified Rankin (Stroke Patients Only) Modified Rankin (Stroke Patients Only) Modified Rankin: Severe disability     Balance Overall balance assessment: Needs assistance Sitting-balance support: Bilateral upper extremity supported;Feet supported Sitting balance-Leahy Scale: Poor Sitting balance - Comments: spent 8 min at EOB working on balance   Standing balance support: Bilateral upper extremity supported Standing balance-Leahy Scale: Poor Standing balance comment: Reliant on UE support:   Stood x3 while in the [employer].  Able to attain full upright position 2nd and third trials with significant assist.                            Cognition Arousal/Alertness: Awake/alert Behavior During Therapy: Flat affect Overall Cognitive Status: Impaired/Different from baseline Area of Impairment: Attention;Following commands;Safety/judgement;Problem solving;Awareness                   Current Attention Level: Focused;Sustained   Following Commands: Follows one step commands inconsistently;Follows one step commands with increased time Safety/Judgement: Decreased awareness of safety;Decreased awareness of deficits Awareness: Intellectual Problem Solving: Slow processing;Requires verbal cues;Decreased initiation General Comments: Pt with decreased following of simple commands; requiring significant amount of time. However, despite poor engagement and following commands, pt making increased amounts of eye contact.       Exercises Other Exercises Other Exercises: warm up ROM exercise to activate LE's    General Comments        Pertinent Vitals/Pain Pain Assessment: Faces Faces  Pain Scale: No hurt Pain Location: generalized Pain Descriptors / Indicators: Discomfort;Restless    Home Living                      Prior Function            PT Goals (current goals can now be found in the care plan section) Acute Rehab PT Goals Patient Stated Goal: to d/c home  with mother after rehab PT Goal Formulation: With patient/family Time For Goal Achievement: 06/20/19 Potential to Achieve Goals: Fair Progress towards PT goals: Progressing toward goals    Frequency    Min 3X/week      PT Plan Current plan remains appropriate    Co-evaluation              AM-PAC PT "6 Clicks" Mobility   Outcome Measure  Help needed turning from your back to your side while in a flat bed without using bedrails?: Total Help needed moving from lying on your back to sitting on the side of a flat bed without using bedrails?: Total Help needed moving to and from a bed to a chair (including a wheelchair)?: Total Help needed standing up from a chair using your arms (e.g., wheelchair or bedside chair)?: Total Help needed to walk in hospital room?: Total Help needed climbing 3-5 steps with a railing? : Total 6 Click Score: 6    End of Session   Activity Tolerance: Patient limited by fatigue;Patient tolerated treatment well Patient left: in chair;with call bell/phone within reach;with family/visitor present;with nursing/sitter in room;Other (comment) Nurse Communication: Mobility status PT Visit Diagnosis: Other abnormalities of gait and mobility (R26.89);Other symptoms and signs involving the nervous system (R29.898);Muscle weakness (generalized) (M62.81)     Time: 6979-4801 PT Time Calculation (min) (ACUTE ONLY): 33 min  Charges:  $Therapeutic Activity: 23-37 mins                     06/10/2019  Ginger Carne., PT Acute Rehabilitation Services [phone]  (pager) [phone]  (office)   Tessie Fass Nora Rooke 06/10/2019, 6:02 PM

## 2019-06-11 ENCOUNTER — Inpatient Hospital Stay (HOSPITAL_COMMUNITY): Payer: No Typology Code available for payment source

## 2019-06-11 DIAGNOSIS — I63312 Cerebral infarction due to thrombosis of left middle cerebral artery: Secondary | ICD-10-CM | POA: Diagnosis not present

## 2019-06-11 DIAGNOSIS — I639 Cerebral infarction, unspecified: Secondary | ICD-10-CM | POA: Diagnosis not present

## 2019-06-11 DIAGNOSIS — N179 Acute kidney failure, unspecified: Secondary | ICD-10-CM | POA: Diagnosis not present

## 2019-06-11 DIAGNOSIS — J9601 Acute respiratory failure with hypoxia: Secondary | ICD-10-CM | POA: Diagnosis not present

## 2019-06-11 DIAGNOSIS — L89159 Pressure ulcer of sacral region, unspecified stage: Secondary | ICD-10-CM

## 2019-06-11 LAB — GLUCOSE, CAPILLARY
Glucose-Capillary: 101 mg/dL — ABNORMAL HIGH (ref 70–99)
Glucose-Capillary: 122 mg/dL — ABNORMAL HIGH (ref 70–99)
Glucose-Capillary: 130 mg/dL — ABNORMAL HIGH (ref 70–99)
Glucose-Capillary: 162 mg/dL — ABNORMAL HIGH (ref 70–99)
Glucose-Capillary: 97 mg/dL (ref 70–99)

## 2019-06-11 LAB — BASIC METABOLIC PANEL
Anion gap: 14 (ref 5–15)
BUN: 23 mg/dL — ABNORMAL HIGH (ref 6–20)
CO2: 23 mmol/L (ref 22–32)
Calcium: 8.8 mg/dL — ABNORMAL LOW (ref 8.9–10.3)
Chloride: 98 mmol/L (ref 98–111)
Creatinine, Ser: 2.09 mg/dL — ABNORMAL HIGH (ref 0.61–1.24)
GFR calc Af Amer: 46 mL/min — ABNORMAL LOW (ref >60)
GFR calc non Af Amer: 40 mL/min — ABNORMAL LOW (ref >60)
Glucose, Bld: 133 mg/dL — ABNORMAL HIGH (ref 70–99)
Potassium: 4.9 mmol/L (ref 3.5–5.1)
Sodium: 135 mmol/L (ref 135–145)

## 2019-06-11 LAB — APTT
aPTT: 47 seconds — ABNORMAL HIGH (ref 24–36)
aPTT: 52 seconds — ABNORMAL HIGH (ref 24–36)
aPTT: 48 seconds — ABNORMAL HIGH (ref 24–36)
aPTT: 37 seconds — ABNORMAL HIGH (ref 24–36)
aPTT: 26 seconds (ref 24–36)

## 2019-06-11 LAB — PROTIME-INR
INR: 2.1 — ABNORMAL HIGH (ref 0.8–1.2)
Prothrombin Time: 23.1 seconds — ABNORMAL HIGH (ref 11.4–15.2)

## 2019-06-11 MED ORDER — ANTICOAGULANT SODIUM CITRATE 4% (200MG/5ML) IV SOLN
5.0000 mL | Status: AC
  Administered 2019-06-11: 5 mL
  Filled 2019-06-11: qty 5

## 2019-06-11 MED ORDER — WARFARIN SODIUM 5 MG PO TABS
5.0000 mg | ORAL_TABLET | Freq: Once | ORAL | Status: AC
  Administered 2019-06-11: 5 mg via ORAL
  Filled 2019-06-11: qty 1

## 2019-06-11 NOTE — Plan of Care (Signed)

## 2019-06-11 NOTE — Progress Notes (Signed)
Patient off floor for 8 am and 12 pm trach check.

## 2019-06-11 NOTE — Progress Notes (Signed)
PROGRESS NOTE    Carlos Brewer.  BTD:[id number] DOB: 05-04-1985 DOA: 05/09/2019 PCP: Patient, No Pcp Per   Brief Narrative:  34 yo male with history of systolic heart failure EF 15%, myocarditis, tobacco abuse, cocaine abuse, nonischemic cardiomyopathy.  Patient presented to the ED after being found to have slurred speech and profound rt sided weakness.  Admitted with left MCA CVA and multiple smaller embolic infarcts s/p tPA and thrombectomy in IR.  Additionally found to have left ventricular  thrombus. Course complicated by septic shock 2/2 HCAP, AKI requiring CRRT/dialysis, polymorphic VT and cardiogenic shock/septic shock requiring Intermittent pressor dependence, required tracheostomy due to extubation difficulties.   Assessment & Plan:   Active Problems:   Occlusion of left middle cerebral artery   Middle cerebral artery embolism, left   Cerebrovascular accident (CVA) due to thrombosis of left middle cerebral artery (HCC)   Shock ([place])   Shock liver   Respiratory failure (HCC)   Pressure injury of skin   Tracheostomy tube present (HCC)   Acute CVA (cerebrovascular accident) ([place])   Acute kidney injury ([place])   Endotracheal tube present   Complication of tracheostomy tube ([place])   Acute hypoxemic respiratory failure (HCC)   Fever  Acute hypoxic hypercapnic respiratory failure in the setting of CVA, HCAP pneumonia with difficulty weaning from ventilator status post tracheostomy, stable -Patient able to wean off of ventilator now status post tracheostomy -Stable at this point, continue to wean oxygen as tolerated -Defer to pulmonology for trach management at this point, appears stable -HCAP pneumonia resolved on antibiotics as below SpO2: 99 % O2 Flow Rate (L/min): 10 L/min FiO2 (%): 40 %  Acute metabolic/toxic encephalopathy in the setting of illicit substance abuse and large MCA territory infarct -Mental status continues to be somewhat limited given large area of  evolving stroke -Patient able to shake head yes or no follow simple commands but difficulty communicating -Patient is toxic encephalopathy appears to be resolved, awake appears to be alert but unable to assess orientation -Continue PT OT, disposition likely to be difficult given patient's profound weakness -Continue tube feeds per dietary given poor PO intake/swallowing difficulties  Shock: Multifactorial likely cardiogenic as well as septic in the setting of HCAP pneumonia, resolved -Patient no longer on pressors, resolved infectious process after completion of antibiotics -Cardiogenic shock appears to be well managed on current regimen, defer to cardiology  Left MCA likely embolic CVA with left pontine involvement -Profound right-sided weakness although appears to be 1 out of 5 strength today on physical exam and able to follow simple commands like move foot, squeeze hand -still profoundly weak on the right compared to left -Continue PT, OT; disposition will likely need to be at skilled care given patient's profound weakness and likely total care needs, will follow up with family as patient progresses -candidate for LTAC at this point  AKI in the setting of acute tubular necrosis secondary to above cardiogenic shock hypoxia and hypotension; anuric on intermittent dialysis per nephrology -Patient being followed by nephrology, receiving dialysis this morning during interview -Creatinine appears to be stabilizing, unclear baseline although given patient's age and lack of history of likely was previously within normal limits Lab Results  Component Value Date   CREATININE 3.42 (H) 06/10/2019   CREATININE 3.39 (H) 06/09/2019   CREATININE 2.87 (H) 06/08/2019   Pressure injury of skin -Wound care and nursing to follow   DVT prophylaxis: Warfarin Code Status: Full Family Communication: Unavailable today by phone Disposition Plan: Continue  as inpatient, likely difficult disposition given  patient's new total care status, PT recommending LTAC at this point, certainly reasonable given patient's respiratory needs and physical therapy needs.  Will follow up with family in the next 24 to 48-hour as well as with case management as patient continues to stabilize.  Patient will likely need prolonged physical therapy and treatment with hopes to continue to improve strength, speech and activity levels.  Significant Hospital Events   2/05 Admit, tPA, IR thrombectomy 2/6 100% on fvent at 2300  Overnight requiring increasing levophed and phenylephrine.    2/6: switched to levophed, epi, started antibiotics, started on CRRT.  2/9  developed wide-complex tachycardia and hypotension, started on amiodarone drip, increase Levophed drip 2/15 drop in platelets, heparin stopped, bival started 2/17 Hypotensive and back on Levophed 2/18 trach, lines changed 2/19 started milrinone , fever 103 2/20 Episode of wide-complex tachycardia yesterday, changed from Levophed to vasopressin 2/22 stopping vanc. Still on inotrope support. Some AF w/ RVR. Had to be placed back on pressors. ivabradine added 2/23 still on pressors/ CRRT continued 2/24 tmax 98.4, Remains on CRRT, even UF, remains anuric, Levophed at 14 mcg/min, Milrinone at 0.125 mcg/kg/min, Coox 63.4 Doing well this morning on ATC, no events overnight.  Midodrine added   2/25 more interactive, remains on ATC, tmax 99.5/ WBC 21.4, ~900 ml liquid stool/ 24 hours, neg Cdiff, Levophed up to 20 mcg/min, CVP 2, ,milrinone remains at 0.125 mcg/kg/min, coox 92.7, Off CRRT since last night ~2000 s/p clotted x 3 off citrate, restarted  3/1 Amio drip restarted for WCT/atrial flutter, milrinone turned off 3/2 CRRT stopped, permacath placed 3/5 crrt resumed 3/6 started on pressors for crrt tolerance 3/8 No longer on CRRT or pressor support  3/12 Tolerated PS for >2 hours  3/17  febrile 106 overnight, shock on pressors, new lines placed, broad-spectrum  antibiotics 3/22: Awake, appears comfortable on pressure support ventilation.  Currently weaning Precedex, he is back on low-dose norepinephrine 3/23 pm back on vent at hs for resp distress / desats ? chf on cxr  3/24 t collar 24/7   Consults:  Neuro IR Cardiology  Nephrology Heart Failure Team EP   Procedures:  2/5-2/6:  TPA given at 428 am (total of 90 Mg) 520 am went to IR  S/P Lt common carotid arteriogram followed by complete revascularization of occluded LT MCA sup division mid M2 seg with x 1 pass with 22mx 40 mm solitaire X ret river device and penumbra aspiration with TICI3revascularization  ETT 2/05 >> 2/18 [facility] CVL 2/5 >>2/18  RT [place] CVL 2/18 >>3/2 RIJ HD cath 2/6 >>2/18 6 shiley cuffed trach 2/18  >>3/4  RT fem HD 2/19 >>3/2 RIJ permacath 3/2>> 3/4 shiley 4 uncuffed.... dislodged placed back 6cuffed shiley that evening 3/5: change to #6 cuffed distal XLT  3/17 right femoral CVL >> 3/23 3/17 right femoral arterial line >>3/23   Significant Diagnostic Tests:   CT angio head/neck 2/05 >> occlusion of Lt MCA bifurcation Echo 2/05 >> EF less than 20%, cannot rule out apical thrombus MRI brain 2/11 > extensive acute infarction of multiple areas without large or medium vessel occlusion Echocardiogram 2/8 Left ventricular ejection fraction, by estimation, is <20%. The left  ventricle has severely decreased function. The left ventricle demonstrates  global hypokinesis. The left ventricular internal cavity size was severely  dilated. Possible small 0.8 x 0.6 cm apical thrombus.   3/16 duplex upper and lower extremities >>negative 3/17 head CT >.  New area  of hypoattenuation in the left pons , left frontal encephalomalacia related to old infarct  Micro Data:  SARS CoV2 PCR 2/05 >> negative Influenza PCR 2/05 >> negative ... Cdiff 2/25 >> neg Trach asp 3/11>> Few candida albicans BC 3/11>> 1 of 4 with Coag neg staph  BC 3/12 x 2 > neg  BC 3/17 x 2 >  neg   Trach aspirate 3/24 >>> no  wbc, no organisms final - nl flora   Antimicrobials:  Meropenem 2/6 -2/12 Zosyn 2/6 , 2/19 >> 2/23 vanc 2/6 >> 2/9 , 2/19 >>2/22 Ceftriaxone 3/12> 3/16 3/17 vanc >> 3/19 3/17 Anidulafungin >> 3/19 3/17 cefepime >>  3/22     Objective: Vitals:   06/11/19 1030 06/11/19 1100 06/11/19 1130 06/11/19 1153  BP: (!) 112/49 111/61 (!) 132/42 122/67  Pulse: (!) 102 (!) 102 (!) 111 (!) 112  Resp: (!) 40 (!) 21 (!) 31 (!) 39  Temp:    97.9 F (36.6 C)  TempSrc:    Oral  SpO2:    99%  Weight:    106.5 kg  Height:        Intake/Output Summary (Last 24 hours) at 06/11/2019 1503 Last data filed at 06/11/2019 1153 Gross per 24 hour  Intake 894.71 ml  Output 2425 ml  Net -1530.29 ml   Filed Weights   06/09/19 0755 06/09/19 1132 06/11/19 1153  Weight: 108.6 kg 105 kg 106.5 kg    Examination:   General:  Pleasantly resting in bed, No acute distress. Awake, alert, unable to assess orientation HEENT:  Normocephalic atraumatic.  Sclerae nonicteric, noninjected.  Extraocular movements intact bilaterally. Neck:  Trach-collar without bleeding or drainage Lungs:  Clear to auscultate bilaterally without rhonchi, wheeze, or rales. Heart:  Regular rate and rhythm.  Without murmurs, rubs, or gallops. Abdomen:  Soft, nontender, nondistended.  Without guarding or rebound. Extremities: Without cyanosis, clubbing, edema, or obvious deformity.  Left-sided strength 3/5, right-sided strength 1/5 Vascular:  Dorsalis pedis and posterior tibial pulses palpable bilaterally. Skin:  Warm and dry, no erythema, no ulcerations. Sacral skin pressure sore.    Data Reviewed: I have personally reviewed following labs and imaging studies  CBC: Recent Labs  Lab 06/06/19 0500 06/06/19 0500 06/07/19 0411 06/08/19 0500 06/08/19 0611 06/09/19 0750 06/10/19 1157  WBC 8.4  --  8.1 13.6*  --  9.5 8.9  HGB 8.0*   < > 7.6* 9.0* 9.2* 8.8* 9.8*  HCT 26.6*   < > 24.8* 28.6*  27.0* 29.3* 31.9*  MCV 106.8*  --  105.5* 104.4*  --  108.1* 103.6*  PLT 225  --  241 331  --  329 323   < > = values in this interval not displayed.   Basic Metabolic Panel: Recent Labs  Lab 06/06/19 0500 06/06/19 0500 06/07/19 0411 06/07/19 1430 06/08/19 0500 06/08/19 0611 06/09/19 0750 06/10/19 1157  NA 140   < > 139  --  137 137 137 135  K 3.3*   < > 3.0*  --  4.5 3.1* 3.2* 3.8  CL 104  --  106  --  100  --  100 97*  CO2 18*  --  18*  --  20*  --  23 22  GLUCOSE 111*  --  129*  --  186*  --  183* 188*  BUN 86*  --  105*  --  49*  --  70* 59*  CREATININE 4.26*  --  4.39*  --  2.87*  --  3.39* 3.42*  CALCIUM 9.1  --  8.7*  --  8.3*  --  8.6* 9.4  PHOS  --   --   --  8.3*  --   --  4.7*  --    < > = values in this interval not displayed.   GFR: Estimated Creatinine Clearance: 38.6 mL/min (A) (by C-G formula based on SCr of 3.42 mg/dL (H)). Liver Function Tests: Recent Labs  Lab 06/05/19 0616 06/05/19 0616 06/06/19 0500 06/07/19 0411 06/08/19 0500 06/09/19 0750 06/10/19 1157  AST 31  --  30 36 91*  --  35  ALT 30  --  28 30 27   --  29  ALKPHOS 163*  --  145* 132* 156*  --  162*  BILITOT 1.0  --  1.0 0.9 2.1*  --  1.1  PROT 6.6  --  6.5 6.3* 6.2*  --  7.0  ALBUMIN 2.6*   < > 2.4* 2.4* 2.6* 2.5* 2.7*   < > = values in this interval not displayed.   No results for input(s): LIPASE, AMYLASE in the last 168 hours. No results for input(s): AMMONIA in the last 168 hours. Coagulation Profile: Recent Labs  Lab 06/07/19 0411 06/08/19 0808 06/09/19 0647 06/10/19 0618 06/11/19 1152  INR 1.9* 1.5* 1.8* 1.6* 2.1*   Cardiac Enzymes: No results for input(s): CKTOTAL, CKMB, CKMBINDEX, TROPONINI in the last 168 hours. BNP (last 3 results) No results for input(s): PROBNP in the last 8760 hours. HbA1C: No results for input(s): HGBA1C in the last 72 hours. CBG: Recent Labs  Lab 06/10/19 1132 06/10/19 1543 06/10/19 1933 06/11/19 0007 06/11/19 1325  GLUCAP 178* 122*  119* 162* 101*   Lipid Profile: No results for input(s): CHOL, HDL, LDLCALC, TRIG, CHOLHDL, LDLDIRECT in the last 72 hours. Thyroid Function Tests: No results for input(s): TSH, T4TOTAL, FREET4, T3FREE, THYROIDAB in the last 72 hours. Anemia Panel: No results for input(s): VITAMINB12, FOLATE, FERRITIN, TIBC, IRON, RETICCTPCT in the last 72 hours. Sepsis Labs: No results for input(s): PROCALCITON, LATICACIDVEN in the last 168 hours.  Recent Results (from the past 240 hour(s))  Culture, respiratory (non-expectorated)     Status: None   Collection Time: 06/08/19  8:08 AM   Specimen: Tracheal Aspirate; Respiratory  Result Value Ref Range Status   Specimen Description TRACHEAL ASPIRATE  Final   Special Requests Normal  Final   Gram Stain NO WBC SEEN NO ORGANISMS SEEN   Final   Culture   Final    FEW Consistent with normal respiratory flora. Performed at [facility], [place] [address], [place], [place] [address]    Report Status 06/10/2019 FINAL  Final         Radiology Studies: No results found.      Scheduled Meds: . amiodarone  200 mg Per Tube BID  . ARIPiprazole  10 mg Per Tube Daily  . aspirin  81 mg Per Tube Daily  . atorvastatin  40 mg Per Tube q1800  . atropine  0.5 mg Intravenous Once  . chlorhexidine gluconate (MEDLINE KIT)  15 mL Mouth Rinse BID  . Chlorhexidine Gluconate Cloth  6 each Topical Daily  . darbepoetin (ARANESP) injection - NON-DIALYSIS  150 mcg Subcutaneous Q Mon-1800  . feeding supplement (PRO-STAT SUGAR FREE 64)  60 mL Per Tube BID  . FLUoxetine  20 mg Per Tube Daily  . free water  100 mL Per Tube Q4H  . insulin aspart  0-20 Units Subcutaneous Q4H  .  insulin aspart  7 Units Subcutaneous Q4H  . insulin detemir  40 Units Subcutaneous BID  . mouth rinse  15 mL Mouth Rinse 10 times per day  . midodrine  5 mg Per Tube Q8H  . pantoprazole sodium  40 mg Per Tube Q1200  . sevelamer carbonate  2.4 g Per Tube TID WC  . sodium chloride flush   10-40 mL Intracatheter Q12H  . warfarin  5 mg Oral ONCE-1600  . Warfarin - Pharmacist Dosing Inpatient   Does not apply q1600   Continuous Infusions: . sodium chloride Stopped (06/07/19 0938)  . sodium chloride Stopped (05/21/19 0940)  . bivalirudin (ANGIOMAX) infusion 0.5 mg/mL (Non-ACS indications) 0.055 mg/kg/hr (06/11/19 0037)  . feeding supplement (VITAL 1.5 CAL) 1,000 mL (06/10/19 2256)     LOS: 50 days    Time spent: 72mn    Tejas Seawood C Julya Alioto, DO [facility]  If 7PM-7AM, please contact night-coverage [email or web address] 06/11/2019, 3:03 PM

## 2019-06-11 NOTE — Procedures (Signed)
Patient seen on Hemodialysis. BP 117/63   Pulse (!) 101   Temp 98.5 F (36.9 C) (Oral)   Resp 20   Ht 6\' 1"  (1.854 m)   Wt 105 kg   SpO2 99%   BMI 30.54 kg/m   QB 400, UF goal 2.5L Tolerating treatment without problems at this time on T Bar.   Elmarie Shiley MD [facility] Office # [phone] Pager # [phone] 10:19 AM

## 2019-06-11 NOTE — Progress Notes (Addendum)
ANTICOAGULATION CONSULT NOTE  Pharmacy Consult:  Bivalirudin >> Coumadin Indication: stroke 2/5, LV thrombus 2/8, HIT 2/15; sepsis  Allergies  Allergen Reactions  . Heparin Other (See Comments)    Heparin induced thrombocytopenia. 2/15 HIT OD 1.692. 2/16 SRA positive-90.     Patient Measurements: Height: 6\' 1"  (185.4 cm) Weight: 234 lb 12.6 oz (106.5 kg) IBW/kg (Calculated) : 79.9   Vital Signs: Temp: 97.9 F (36.6 C) (03/27 1153) Temp Source: Oral (03/27 1153) BP: 122/67 (03/27 1153) Pulse Rate: 112 (03/27 1153)  Labs: Recent Labs    06/09/19 8563 06/09/19 0750 06/10/19 0618 06/10/19 1157 06/10/19 1408 06/10/19 2327 06/11/19 1152 06/11/19 1448  HGB  --  8.8*  --  9.8*  --   --   --   --   HCT  --  29.3*  --  31.9*  --   --   --   --   PLT  --  329  --  323  --   --   --   --   APTT  --   --   --  53*   < > 47* 52* 48*  LABPROT 20.4*  --  19.3*  --   --   --  23.1*  --   INR 1.8*  --  1.6*  --   --   --  2.1*  --   CREATININE  --  3.39*  --  3.42*  --   --   --   --    < > = values in this interval not displayed.    Estimated Creatinine Clearance: 38.6 mL/min (A) (by C-G formula based on SCr of 3.42 mg/dL (H)).  Assessment: 34 yr old male with history of ESRD on HD presented with left MCA infarct - received TPA and revascularization with IR on 2/5. On 2/8 found to have small LV apical thrombus on ECHO. Pharmacy consulted to dose IV heparin, which was switched to bivalirudin 2/15 for HIT. 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 SRA very positive at 22. Heparin allergy has appropriately been added to chart and updated with SRA results. Transitioned off bivalrudin on 3/14. Restarted bivalirudin 3/26.   On 3/19, patient had supratherapeutic INRs of 4>5>4.7. INR elevation may have been caused by drug interaction with amiodarone or combination of this, tube feeds being held and restarted at lower rate, and false elevation due to  hemodialysis. INR has been subtherapeutic since 3/22, likely due to tube feed rate being increased from 20 to 60 ml/hr.   Per protocol, will stop bivalirudin when INR > 3.0 and check INR and aPTT 4-6 hr later.  1. If INR at goal (2-3)         a. If aPTT > 40: Assume prolonged bivalirudin clearance and             repeat aPTT and INR in 4-6 hrs.         b. If aPTT < 40: Begin warfarin alone 2. INR subtherapeutic: Resume bivalirudin   PM f/u > aPTT below goal.  No known issues with IV infusion.  Pt s/p HD this AM.  Goal of Therapy:  INR 2-3 APTT 50-65 seconds Monitor platelets by anticoagulation protocol: Yes   Plan:  - Increase bivalirudin to 0.065 mg/kg/hr.  -Recheck aPTT in 2 hrs.  [name], [facility] Clinical Pharmacist Phone [phone]  06/11/2019 3:19 PM   Addendum:  APTT trending down to  37 despite rate increase in bivalirudin.  Confirmed with RN no issues with IV infusion, IV site looks okay.  Will increase bivalirudin to 0.075 mg/kg/hr.  Recheck aPTT in 2 hrs.  [name], [facility] Clinical Pharmacist Phone [phone]  06/11/2019 8:40 PM

## 2019-06-11 NOTE — Progress Notes (Signed)
ANTICOAGULATION CONSULT NOTE  Pharmacy Consult:  Bivalirudin >> Coumadin Indication: stroke 2/5, LV thrombus 2/8, HIT 2/15; sepsis  Allergies  Allergen Reactions  . Heparin Other (See Comments)    Heparin induced thrombocytopenia. 2/15 HIT OD 1.692. 2/16 SRA positive-90.     Patient Measurements: Height: 6\' 1"  (185.4 cm) Weight: 231 lb 7.7 oz (105 kg) IBW/kg (Calculated) : 79.9   Vital Signs: Temp: 98.6 F (37 C) (03/27 0007) Temp Source: Oral (03/27 0007) BP: 115/71 (03/27 0007) Pulse Rate: 107 (03/27 0007)  Labs: Recent Labs    06/08/19 0500 06/08/19 0500 06/08/19 0611 06/08/19 1093 06/08/19 0808 06/09/19 0647 06/09/19 0750 06/10/19 0618 06/10/19 1157 06/10/19 1157 06/10/19 1408 06/10/19 1907 06/10/19 2327  HGB 9.0*   < > 9.2*   < >  --   --  8.8*  --  9.8*  --   --   --   --   HCT 28.6*   < > 27.0*  --   --   --  29.3*  --  31.9*  --   --   --   --   PLT 331  --   --   --   --   --  329  --  323  --   --   --   --   APTT  --   --   --   --   --   --   --   --  53*   < > 76* 52* 47*  LABPROT  --   --   --   --  18.2* 20.4*  --  19.3*  --   --   --   --   --   INR  --   --   --   --  1.5* 1.8*  --  1.6*  --   --   --   --   --   CREATININE 2.87*  --   --   --   --   --  3.39*  --  3.42*  --   --   --   --    < > = values in this interval not displayed.    Estimated Creatinine Clearance: 38.3 mL/min (A) (by C-G formula based on SCr of 3.42 mg/dL (H)).  Assessment: 34 yr old male with history of ESRD on HD presented with left MCA infarct - received TPA and revascularization with IR on 2/5. On 2/8 found to have small LV apical thrombus on ECHO. Pharmacy consulted to dose IV heparin, which was switched to bivalirudin 2/15 for HIT. 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 SRA very positive at 43. Heparin allergy has appropriately been added to chart and updated with SRA results. Transitioned off bivalrudin on 3/14. Restarted  bivalirudin 3/26.   aPTT this evening is slightly SUBtherapeutic (aPTT 47 << 52). No bleeding or issues noted per RN.   Goal of Therapy:  INR 2-3 APTT 50-65 seconds Monitor platelets by anticoagulation protocol: Yes   Plan:  - Increase bivalirudin infusion to 0.055 mg/kg/hr (11.6 ml/hr) - Will continue to monitor for any signs/symptoms of bleeding and will follow up with an aPTT in 4 hours after the rate adjustment -  Monitor aPTT q4hr until two therapeutic levels then daily  Thank you for allowing pharmacy to be a part of this patient's care.  Alycia Rossetti, PharmD, BCPS Clinical Pharmacist 06/11/2019 12:36 AM   **Pharmacist phone directory can  now be found on [email or web address] ([email or web address]).  Listed under [place].

## 2019-06-11 NOTE — Progress Notes (Signed)
ANTICOAGULATION CONSULT NOTE  Pharmacy Consult:  Bivalirudin >> Coumadin Indication: stroke 2/5, LV thrombus 2/8, HIT 2/15; sepsis  Allergies  Allergen Reactions  . Heparin Other (See Comments)    Heparin induced thrombocytopenia. 2/15 HIT OD 1.692. 2/16 SRA positive-90.     Patient Measurements: Height: 6\' 1"  (185.4 cm) Weight: 234 lb 12.6 oz (106.5 kg) IBW/kg (Calculated) : 79.9   Vital Signs: Temp: 97.9 F (36.6 C) (03/27 1153) Temp Source: Oral (03/27 1153) BP: 122/67 (03/27 1153) Pulse Rate: 112 (03/27 1153)  Labs: Recent Labs    06/09/19 0647 06/09/19 0750 06/10/19 0618 06/10/19 1157 06/10/19 1408 06/10/19 1907 06/10/19 2327 06/11/19 1152  HGB  --  8.8*  --  9.8*  --   --   --   --   HCT  --  29.3*  --  31.9*  --   --   --   --   PLT  --  329  --  323  --   --   --   --   APTT  --   --   --  53*   < > 52* 47* 52*  LABPROT 20.4*  --  19.3*  --   --   --   --  23.1*  INR 1.8*  --  1.6*  --   --   --   --  2.1*  CREATININE  --  3.39*  --  3.42*  --   --   --   --    < > = values in this interval not displayed.    Estimated Creatinine Clearance: 38.6 mL/min (A) (by C-G formula based on SCr of 3.42 mg/dL (H)).  Assessment: 34 yr old male with history of ESRD on HD presented with left MCA infarct - received TPA and revascularization with IR on 2/5. On 2/8 found to have small LV apical thrombus on ECHO. Pharmacy consulted to dose IV heparin, which was switched to bivalirudin 2/15 for HIT. 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 SRA very positive at 11. Heparin allergy has appropriately been added to chart and updated with SRA results. Transitioned off bivalrudin on 3/14. Restarted bivalirudin 3/26.   On 3/19, patient had supratherapeutic INRs of 4>5>4.7. INR elevation may have been caused by drug interaction with amiodarone or combination of this, tube feeds being held and restarted at lower rate, and false elevation due to  hemodialysis. INR has been subtherapeutic since 3/22, likely due to tube feed rate being increased from 20 to 60 ml/hr.   PTT is therapeutic this AM at 52. INR up to 2.1. We will cont with the same rate and check a confirm PTT in 2 hrs. Will   Per protocol, will stop bivalirudin when INR > 3.0 and check INR and aPTT 4-6 hr later.  1. If INR at goal (2-3)         a. If aPTT > 40: Assume prolonged bivalirudin clearance and             repeat aPTT and INR in 4-6 hrs.         b. If aPTT < 40: Begin warfarin alone 2. INR subtherapeutic: Resume bivalirudin   Goal of Therapy:  INR 2-3 APTT 50-65 seconds Monitor platelets by anticoagulation protocol: Yes   Plan:  - Give warfarin 5 mg PO x1 - Cont bivalirudin infusion 0.055 mg/kg/hr - Confirm PTT in 2 hr - Order CBC, Bmet, aPTT - Monitor aPTT  q4hr until two therapeutic levels then daily - Monitor daily INR, DDI with amiodarone, s/sx of bleeding  Onnie Boer, PharmD, BCIDP, AAHIVP, CPP Infectious Disease Pharmacist 06/11/2019 12:51 PM

## 2019-06-12 DIAGNOSIS — I639 Cerebral infarction, unspecified: Secondary | ICD-10-CM | POA: Diagnosis not present

## 2019-06-12 DIAGNOSIS — N179 Acute kidney failure, unspecified: Secondary | ICD-10-CM | POA: Diagnosis not present

## 2019-06-12 DIAGNOSIS — J9601 Acute respiratory failure with hypoxia: Secondary | ICD-10-CM | POA: Diagnosis not present

## 2019-06-12 DIAGNOSIS — I63312 Cerebral infarction due to thrombosis of left middle cerebral artery: Secondary | ICD-10-CM | POA: Diagnosis not present

## 2019-06-12 LAB — BASIC METABOLIC PANEL
Anion gap: 18 — ABNORMAL HIGH (ref 5–15)
BUN: 35 mg/dL — ABNORMAL HIGH (ref 6–20)
CO2: 22 mmol/L (ref 22–32)
Calcium: 9.6 mg/dL (ref 8.9–10.3)
Chloride: 96 mmol/L — ABNORMAL LOW (ref 98–111)
Creatinine, Ser: 2.49 mg/dL — ABNORMAL HIGH (ref 0.61–1.24)
GFR calc Af Amer: 37 mL/min — ABNORMAL LOW (ref >60)
GFR calc non Af Amer: 32 mL/min — ABNORMAL LOW (ref >60)
Glucose, Bld: 83 mg/dL (ref 70–99)
Potassium: 4.1 mmol/L (ref 3.5–5.1)
Sodium: 136 mmol/L (ref 135–145)

## 2019-06-12 LAB — GLUCOSE, CAPILLARY
Glucose-Capillary: 103 mg/dL — ABNORMAL HIGH (ref 70–99)
Glucose-Capillary: 108 mg/dL — ABNORMAL HIGH (ref 70–99)
Glucose-Capillary: 122 mg/dL — ABNORMAL HIGH (ref 70–99)
Glucose-Capillary: 127 mg/dL — ABNORMAL HIGH (ref 70–99)
Glucose-Capillary: 143 mg/dL — ABNORMAL HIGH (ref 70–99)
Glucose-Capillary: 85 mg/dL (ref 70–99)

## 2019-06-12 LAB — PROTIME-INR
INR: 2.4 — ABNORMAL HIGH (ref 0.8–1.2)
Prothrombin Time: 26.3 seconds — ABNORMAL HIGH (ref 11.4–15.2)

## 2019-06-12 LAB — APTT
aPTT: 60 seconds — ABNORMAL HIGH (ref 24–36)
aPTT: 87 seconds — ABNORMAL HIGH (ref 24–36)

## 2019-06-12 LAB — CBC
HCT: 33.5 % — ABNORMAL LOW (ref 39.0–52.0)
Hemoglobin: 10.2 g/dL — ABNORMAL LOW (ref 13.0–17.0)
MCH: 31.7 pg (ref 26.0–34.0)
MCHC: 30.4 g/dL (ref 30.0–36.0)
MCV: 104 fL — ABNORMAL HIGH (ref 80.0–100.0)
Platelets: 366 10*3/uL (ref 150–400)
RBC: 3.22 MIL/uL — ABNORMAL LOW (ref 4.22–5.81)
RDW: 19.5 % — ABNORMAL HIGH (ref 11.5–15.5)
WBC: 10.2 10*3/uL (ref 4.0–10.5)
nRBC: 0 % (ref 0.0–0.2)

## 2019-06-12 MED ORDER — MORPHINE SULFATE (PF) 2 MG/ML IV SOLN
2.0000 mg | INTRAVENOUS | Status: DC | PRN
  Administered 2019-06-13 – 2019-06-15 (×5): 2 mg via INTRAVENOUS
  Filled 2019-06-12 (×5): qty 1

## 2019-06-12 MED ORDER — WARFARIN SODIUM 7.5 MG PO TABS: 7.5000 mg | ORAL_TABLET | Freq: Once | ORAL | Status: DC

## 2019-06-12 MED ORDER — LORAZEPAM 2 MG/ML IJ SOLN
1.0000 mg | INTRAMUSCULAR | Status: DC | PRN
  Administered 2019-06-12 – 2019-06-22 (×16): 1 mg via INTRAVENOUS
  Filled 2019-06-12 (×16): qty 1

## 2019-06-12 NOTE — Progress Notes (Addendum)
ANTICOAGULATION CONSULT NOTE  Pharmacy Consult:  Bivalirudin >> Coumadin Indication: stroke 2/5, LV thrombus 2/8, HIT 2/15; sepsis  Allergies  Allergen Reactions  . Heparin Other (See Comments)    Heparin induced thrombocytopenia. 2/15 HIT OD 1.692. 2/16 SRA positive-90.     Patient Measurements: Height: 6\' 1"  (185.4 cm) Weight: 234 lb 12.6 oz (106.5 kg) IBW/kg (Calculated) : 79.9   Vital Signs: Temp: 98.8 F (37.1 C) (03/27 2346) Temp Source: Oral (03/27 2346) BP: 114/73 (03/27 2346) Pulse Rate: 104 (03/27 2346)  Labs: Recent Labs    06/09/19 0647 06/09/19 0750 06/10/19 0618 06/10/19 1157 06/10/19 1408 06/11/19 1152 06/11/19 1152 06/11/19 1448 06/11/19 1905 06/11/19 2311  HGB  --  8.8*  --  9.8*  --   --   --   --   --   --   HCT  --  29.3*  --  31.9*  --   --   --   --   --   --   PLT  --  329  --  323  --   --   --   --   --   --   APTT  --   --   --  53*   < > 52*   < > 48* 37* 26  LABPROT 20.4*  --  19.3*  --   --  23.1*  --   --   --   --   INR 1.8*  --  1.6*  --   --  2.1*  --   --   --   --   CREATININE  --  3.39*  --  3.42*  --   --   --  2.09*  --   --    < > = values in this interval not displayed.    Estimated Creatinine Clearance: 63.1 mL/min (A) (by C-G formula based on SCr of 2.09 mg/dL (H)).  Assessment: 34 yr old male with history of ESRD on HD presented with left MCA infarct - received TPA and revascularization with IR on 2/5. On 2/8 found to have small LV apical thrombus on ECHO. Pharmacy consulted to dose IV heparin, which was switched to bivalirudin 2/15 for HIT. 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 SRA very positive at 22. Heparin allergy has appropriately been added to chart and updated with SRA results. Transitioned off bivalrudin on 3/14. Restarted bivalirudin 3/26.   aPTT this evening is slightly SUBtherapeutic (aPTT 47 << 52). No bleeding or issues noted per RN.   3/28 AM update:  APTT remains  low Pt had HD today  Goal of Therapy:  INR 2-3 APTT 50-65 seconds Monitor platelets by anticoagulation protocol: Yes   Plan:  - Increase bivalirudin infusion to 0.095 mg/kg/hr  - Will continue to monitor for any signs/symptoms of bleeding and will follow up with an aPTT in 4 hours after the rate adjustment -  Monitor aPTT q4hr until two therapeutic levels then daily  Narda Bonds, PharmD, BCPS Clinical Pharmacist Phone: [phone]   5 am: repeat PTT therapeutic, no change in rate Thank you Anette Guarneri, PharmD

## 2019-06-12 NOTE — Progress Notes (Signed)
ANTICOAGULATION CONSULT NOTE  Pharmacy Consult:  Bivalirudin >> Coumadin Indication: stroke 2/5, LV thrombus 2/8, HIT 2/15; sepsis  Allergies  Allergen Reactions  . Heparin Other (See Comments)    Heparin induced thrombocytopenia. 2/15 HIT OD 1.692. 2/16 SRA positive-90.     Patient Measurements: Height: 6\' 1"  (185.4 cm) Weight: 234 lb 12.6 oz (106.5 kg) IBW/kg (Calculated) : 79.9   Vital Signs: Temp: 98.1 F (36.7 C) (03/28 1223) Temp Source: Axillary (03/28 1223) BP: 118/71 (03/28 1223) Pulse Rate: 104 (03/28 1124)  Labs: Recent Labs    06/10/19 0618 06/10/19 1157 06/10/19 1408 06/11/19 1152 06/11/19 1152 06/11/19 1448 06/11/19 1905 06/11/19 2311 06/12/19 0213 06/12/19 0456 06/12/19 1148  HGB  --  9.8*  --   --   --   --   --   --  10.2*  --   --   HCT  --  31.9*  --   --   --   --   --   --  33.5*  --   --   PLT  --  323  --   --   --   --   --   --  366  --   --   APTT  --  53*   < > 52*   < > 48*   < > 26  --  60* 87*  LABPROT 19.3*  --   --  23.1*  --   --   --   --   --  26.3*  --   INR 1.6*  --   --  2.1*  --   --   --   --   --  2.4*  --   CREATININE  --  3.42*  --   --   --  2.09*  --   --  2.49*  --   --    < > = values in this interval not displayed.    Estimated Creatinine Clearance: 53 mL/min (A) (by C-G formula based on SCr of 2.49 mg/dL (H)).  Assessment: 34 yr old male with history of ESRD on HD presented with left MCA infarct - received TPA and revascularization with IR on 2/5. On 2/8 found to have small LV apical thrombus on ECHO. Pharmacy consulted to dose IV heparin, which was switched to bivalirudin 2/15 for HIT. 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 SRA very positive at 61. Heparin allergy has appropriately been added to chart and updated with SRA results. Transitioned off bivalrudin on 3/14. Restarted bivalirudin 3/26.   aPTT this afternoon is at upper end of goal. No bleeding or issues noted per RN.    Rn called and said cortrak dislodged so he will not be able to have warfarin tonight.   Goal of Therapy:  INR 2-3 APTT 50-85 seconds Monitor platelets by anticoagulation protocol: Yes   Plan:  - Decrease bivalirudin infusion to 0.085 mg/kg/hr  - Will recheck aptt in am  Erin Hearing PharmD., BCPS Clinical Pharmacist 06/12/2019 2:31 PM

## 2019-06-12 NOTE — Progress Notes (Signed)
PROGRESS NOTE    Carlos Brewer.  IDP:[id number] DOB: April 05, 1985 DOA: 05/09/2019 PCP: Patient, No Pcp Per   Brief Narrative:  34 yo male with history of systolic heart failure EF 15%, myocarditis, tobacco abuse, cocaine abuse, nonischemic cardiomyopathy.  Patient presented to the ED after being found to have slurred speech and profound rt sided weakness.  Admitted with left MCA CVA and multiple smaller embolic infarcts s/p tPA and thrombectomy in IR.  Additionally found to have left ventricular  thrombus. Course complicated by septic shock 2/2 HCAP, AKI requiring CRRT/dialysis, polymorphic VT and cardiogenic shock/septic shock requiring Intermittent pressor dependence, required tracheostomy due to extubation difficulties.  Assessment & Plan:   Active Problems:   Occlusion of left middle cerebral artery   Middle cerebral artery embolism, left   Cerebrovascular accident (CVA) due to thrombosis of left middle cerebral artery (HCC)   Shock ([place])   Shock liver   Respiratory failure (HCC)   Pressure injury of skin   Tracheostomy tube present ([place])   Acute CVA (cerebrovascular accident) ([place])   Acute kidney injury ([place])   Endotracheal tube present   Complication of tracheostomy tube ([place])   Acute hypoxemic respiratory failure (HCC)   Fever   Acute hypoxic hypercapnic respiratory failure in the setting of CVA, HCAP pneumonia with difficulty weaning from ventilator status post tracheostomy, stable - Patient able to wean off of ventilator now status post tracheostomy - Stable at this point, continue to wean oxygen as tolerated - Defer to pulmonology for trach management at this point, appears stable - HCAP pneumonia resolved on antibiotics as below SpO2: 99 % O2 Flow Rate (L/min): 10 L/min FiO2 (%): 40 %   Acute metabolic/toxic encephalopathy in the setting of illicit substance abuse and large MCA territory infarct - Mental status continues to be somewhat limited given large  area of evolving stroke - Patient able to shake head yes or no follow simple commands but difficulty communicating - Patient is toxic encephalopathy appears to be resolved, awake appears to be alert but unable to assess orientation - Continue PT OT, disposition likely to be difficult given patient's profound weakness - Continue tube feeds per dietary given poor PO intake/swallowing difficulties - PEG tube approved by mother - will discuss with team.   Shock: Multifactorial likely cardiogenic as well as septic in the setting of HCAP pneumonia, resolved - Patient no longer on pressors, resolved infectious process after completion of antibiotics - Cardiogenic shock appears to be well managed on current regimen, defer to cardiology   Left MCA likely embolic CVA with left pontine involvement - Profound right-sided weakness although appears to be 1 out of 5 strength today on physical exam and able to follow simple commands like move foot, squeeze hand -still profoundly weak on the right compared to left - Continue PT, OT; disposition will likely need to be at skilled care given patient's profound weakness and likely total care needs, will follow up with family as patient progresses -candidate for LTAC at this point   AKI in the setting of acute tubular necrosis secondary to above cardiogenic shock hypoxia and hypotension; anuric on intermittent dialysis per nephrology - Patient being followed by nephrology, receiving dialysis this morning during interview - Creatinine appears to be stabilizing, unclear baseline although given patient's age and lack of history of likely was previously within normal limits Lab Results  Component Value Date   CREATININE 2.49 (H) 06/12/2019   CREATININE 2.09 (H) 06/11/2019   CREATININE 3.42 (H)  06/10/2019    HIT - Currently being managed by pharmacy - on bivalrubin>> transitioning to warfarin   Pressure injury of skin - Wound care and nursing to follow  DVT  prophylaxis: Warfarin Code Status: Full Family Communication: Mother by phone - lengthy discussion about need for PEG tube and likely prolonged therapy and placement. Disposition Plan: Continue as inpatient, likely difficult disposition given patient's new total care status, PT recommending LTAC at this point, certainly reasonable given patient's respiratory needs and physical therapy needs. Will follow up with family in the next 24 to 47 - hour as well as with case management as patient continues to stabilize. Patient will likely need prolonged physical therapy and treatment with hopes to continue to improve strength, speech and activity levels.  Significant Hospital Events   2/05 Admit, tPA, IR thrombectomy 2/6 100% on fvent at 2300  Overnight requiring increasing levophed and phenylephrine.    2/6: switched to levophed, epi, started antibiotics, started on CRRT.  2/9  developed wide-complex tachycardia and hypotension, started on amiodarone drip, increase Levophed drip 2/15 drop in platelets, heparin stopped, bival started 2/17 Hypotensive and back on Levophed 2/18 trach, lines changed 2/19 started milrinone , fever 103 2/20 Episode of wide-complex tachycardia yesterday, changed from Levophed to vasopressin 2/22 stopping vanc. Still on inotrope support. Some AF w/ RVR. Had to be placed back on pressors. ivabradine added 2/23 still on pressors/ CRRT continued 2/24 tmax 98.4, Remains on CRRT, even UF, remains anuric, Levophed at 14 mcg/min, Milrinone at 0.125 mcg/kg/min, Coox 63.4 Doing well this morning on ATC, no events overnight.  Midodrine added   2/25 more interactive, remains on ATC, tmax 99.5/ WBC 21.4, ~900 ml liquid stool/ 24 hours, neg Cdiff, Levophed up to 20 mcg/min, CVP 2, ,milrinone remains at 0.125 mcg/kg/min, coox 92.7, Off CRRT since last night ~2000 s/p clotted x 3 off citrate, restarted  3/1 Amio drip restarted for WCT/atrial flutter, milrinone turned off 3/2 CRRT stopped,  permacath placed 3/5 crrt resumed 3/6 started on pressors for crrt tolerance 3/8 No longer on CRRT or pressor support  3/12 Tolerated PS for >2 hours  3/17  febrile 106 overnight, shock on pressors, new lines placed, broad-spectrum antibiotics 3/22: Awake, appears comfortable on pressure support ventilation.  Currently weaning Precedex, he is back on low-dose norepinephrine 3/23 pm back on vent at hs for resp distress / desats ? chf on cxr  3/24 T-collar 24/7  3/26-3/28 - NG pulled out - family agreeable to PEG tube - continue PT/OT/SLP - likely candidate for LTAC in the next few days to week pending clinical course and placement options.   Consults:  Neuro IR Cardiology  Nephrology Heart Failure Team EP   Procedures:  2/5-2/6:  TPA given at 428 am (total of 90 Mg) 520 am went to IR  S/P Lt common carotid arteriogram followed by complete revascularization of occluded LT MCA sup division mid M2 seg with x 1 pass with 27mx 40 mm solitaire X ret river device and penumbra aspiration with TICI3revascularization  ETT 2/05 >> 2/18 LIJ CVL 2/5 >>2/18  RT [place] CVL 2/18 >>3/2 RIJ HD cath 2/6 >>2/18 6 shiley cuffed trach 2/18  >>3/4  RT fem HD 2/19 >>3/2 RIJ permacath 3/2>> 3/4 shiley 4 uncuffed.... dislodged placed back 6cuffed shiley that evening 3/5: change to #6 cuffed distal XLT  3/17 right femoral CVL >> 3/23 3/17 right femoral arterial line >>3/23   Significant Diagnostic Tests:   CT angio head/neck 2/05 >> occlusion  of Lt MCA bifurcation Echo 2/05 >> EF less than 20%, cannot rule out apical thrombus MRI brain 2/11 > Extensive acute infarction of multiple areas without large or medium vessel occlusion Echocardiogram 2/8 Left ventricular ejection fraction, by estimation, is <20%. The left  ventricle has severely decreased function. The left ventricle demonstrates  global hypokinesis. The left ventricular internal cavity size was severely dilated. Possible small 0.8 x  0.6 cm apical thrombus.   3/16 duplex upper and lower extremities >>negative  3/17 head CT > New area of hypoattenuation in the left pons, left frontal encephalomalacia related to old infarct  Micro Data:  SARS CoV2 PCR 2/05 >> negative Influenza PCR 2/05 >> negative ... Cdiff 2/25 >> neg Trach asp 3/11>> Few candida albicans BC 3/11>> 1 of 4 with Coag neg staph  BC 3/12 x 2 > neg  BC 3/17 x 2 > neg   Trach aspirate 3/24 >>> no  wbc, no organisms final - nl flora   Antimicrobials:  Meropenem 2/6 -2/12 Zosyn 2/6 , 2/19 >> 2/23 vanc 2/6 >> 2/9 , 2/19 >>2/22 Ceftriaxone 3/12> 3/16 3/17 vanc >> 3/19 3/17 Anidulafungin >> 3/19 3/17 cefepime >>  3/22  Objective: Vitals:   06/12/19 0502 06/12/19 0527 06/12/19 0735 06/12/19 0836  BP:   118/82   Pulse: (!) 108  (!) 108 (!) 103  Resp: 20  (!) 24 17  Temp:   98.7 F (37.1 C)   TempSrc:   Axillary   SpO2: 95% 98% 95% 99%  Weight:      Height:        Intake/Output Summary (Last 24 hours) at 06/12/2019 1101 Last data filed at 06/12/2019 0800 Gross per 24 hour  Intake 1132.92 ml  Output 2480 ml  Net -1347.08 ml   Filed Weights   06/09/19 0755 06/09/19 1132 06/11/19 1153  Weight: 108.6 kg 105 kg 106.5 kg    Examination:   General: Pleasantly resting in bed, No acute distress. Awake, alert, unable to assess orientation given speech issues. HEENT: Normocephalic atraumatic.  Sclerae nonicteric, noninjected.  Extraocular movements intact bilaterally. Coretrak NG in place. Neck: Trach-collar without bleeding or drainage. Lungs: Clear to auscultate bilaterally without rhonchi, wheeze, or rales. Heart: Regular rate and rhythm.  Without murmurs, rubs, or gallops. Abdomen: Soft, nontender, nondistended.  Without guarding or rebound. Extremities: Without cyanosis, clubbing, edema, or obvious deformity. Left-sided strength 3/5, right-sided strength 1/5 Vascular: Dorsalis pedis and posterior tibial pulses palpable  bilaterally. Skin: Warm and dry, no erythema, no ulcerations. Sacral skin pressure sore.  Data Reviewed: I have personally reviewed following labs and imaging studies  CBC: Recent Labs  Lab 06/07/19 0411 06/07/19 0411 06/08/19 0500 06/08/19 0611 06/09/19 0750 06/10/19 1157 06/12/19 0213  WBC 8.1  --  13.6*  --  9.5 8.9 10.2  HGB 7.6*   < > 9.0* 9.2* 8.8* 9.8* 10.2*  HCT 24.8*   < > 28.6* 27.0* 29.3* 31.9* 33.5*  MCV 105.5*  --  104.4*  --  108.1* 103.6* 104.0*  PLT 241  --  331  --  329 323 366   < > = values in this interval not displayed.   Basic Metabolic Panel: Recent Labs  Lab 06/07/19 0411 06/07/19 1430 06/08/19 0500 06/08/19 0500 06/08/19 0611 06/09/19 0750 06/10/19 1157 06/11/19 1448 06/12/19 0213  NA   < >  --  137   < > 137 137 135 135 136  K   < >  --  4.5   < >  3.1* 3.2* 3.8 4.9 4.1  CL   < >  --  100  --   --  100 97* 98 96*  CO2   < >  --  20*  --   --  23 22 23 22   GLUCOSE   < >  --  186*  --   --  183* 188* 133* 83  BUN   < >  --  49*  --   --  70* 59* 23* 35*  CREATININE   < >  --  2.87*  --   --  3.39* 3.42* 2.09* 2.49*  CALCIUM   < >  --  8.3*  --   --  8.6* 9.4 8.8* 9.6  PHOS  --  8.3*  --   --   --  4.7*  --   --   --    < > = values in this interval not displayed.   GFR: Estimated Creatinine Clearance: 53 mL/min (A) (by C-G formula based on SCr of 2.49 mg/dL (H)). Liver Function Tests: Recent Labs  Lab 06/06/19 0500 06/07/19 0411 06/08/19 0500 06/09/19 0750 06/10/19 1157  AST 30 36 91*  --  35  ALT 28 30 27   --  29  ALKPHOS 145* 132* 156*  --  162*  BILITOT 1.0 0.9 2.1*  --  1.1  PROT 6.5 6.3* 6.2*  --  7.0  ALBUMIN 2.4* 2.4* 2.6* 2.5* 2.7*   No results for input(s): LIPASE, AMYLASE in the last 168 hours. No results for input(s): AMMONIA in the last 168 hours. Coagulation Profile: Recent Labs  Lab 06/08/19 0808 06/09/19 0647 06/10/19 0618 06/11/19 1152 06/12/19 0456  INR 1.5* 1.8* 1.6* 2.1* 2.4*   Cardiac Enzymes: No  results for input(s): CKTOTAL, CKMB, CKMBINDEX, TROPONINI in the last 168 hours. BNP (last 3 results) No results for input(s): PROBNP in the last 8760 hours. HbA1C: No results for input(s): HGBA1C in the last 72 hours. CBG: Recent Labs  Lab 06/11/19 1557 06/11/19 2000 06/11/19 2341 06/12/19 0401 06/12/19 0825  GLUCAP 130* 122* 97 85 103*   Lipid Profile: No results for input(s): CHOL, HDL, LDLCALC, TRIG, CHOLHDL, LDLDIRECT in the last 72 hours. Thyroid Function Tests: No results for input(s): TSH, T4TOTAL, FREET4, T3FREE, THYROIDAB in the last 72 hours. Anemia Panel: No results for input(s): VITAMINB12, FOLATE, FERRITIN, TIBC, IRON, RETICCTPCT in the last 72 hours. Sepsis Labs: No results for input(s): PROCALCITON, LATICACIDVEN in the last 168 hours.  Recent Results (from the past 240 hour(s))  Culture, respiratory (non-expectorated)     Status: None   Collection Time: 06/08/19  8:08 AM   Specimen: Tracheal Aspirate; Respiratory  Result Value Ref Range Status   Specimen Description TRACHEAL ASPIRATE  Final   Special Requests Normal  Final   Gram Stain NO WBC SEEN NO ORGANISMS SEEN   Final   Culture   Final    FEW Consistent with normal respiratory flora. Performed at [facility], [place] [address] [place], [place] [address]    Report Status 06/10/2019 FINAL  Final    Radiology Studies: DG Abd 1 View  Result Date: 06/11/2019 CLINICAL DATA:  Enteric catheter placement EXAM: ABDOMEN - 1 VIEW COMPARISON:  06/02/2019 FINDINGS: Supine frontal view of the lower chest and upper abdomen was performed. No enteric catheter is identified. Bowel gas pattern is unremarkable. Lung bases are clear. IMPRESSION: 1. There is no enteric catheter identified on this exam. Chest x-ray may be useful to  assess enteric catheter position. Electronically Signed   By: Randa Ngo M.D.   On: 06/11/2019 20:47   DG CHEST PORT 1 VIEW  Result Date: 06/11/2019 CLINICAL DATA:  Feeding tube  placement EXAM: PORTABLE CHEST 1 VIEW COMPARISON:  June 08, 2019 FINDINGS: The tracheostomy tube terminates above the carina. The tunneled dialysis catheter is stable in positioning. There is an enteric feeding tube, the tip of which projects over the upper mediastinum. The heart size is enlarged. Diffuse hazy bilateral airspace opacities are noted. There is no pneumothorax. There are probable pleural effusions. IMPRESSION: 1. Feeding tube projects over the upper mediastinum. Repositioning is recommended. 2. Remaining lines and tubes as above. 3. Cardiomegaly with persistent diffuse hazy bilateral airspace opacities as before. These results will be called to the ordering clinician or representative by the Radiologist Assistant, and communication documented in the PACS or [employer]. Electronically Signed   By: Constance Holster M.D.   On: 06/11/2019 23:42   Scheduled Meds: . amiodarone  200 mg Per Tube BID  . ARIPiprazole  10 mg Per Tube Daily  . aspirin  81 mg Per Tube Daily  . atorvastatin  40 mg Per Tube q1800  . atropine  0.5 mg Intravenous Once  . chlorhexidine gluconate (MEDLINE KIT)  15 mL Mouth Rinse BID  . Chlorhexidine Gluconate Cloth  6 each Topical Daily  . darbepoetin (ARANESP) injection - NON-DIALYSIS  150 mcg Subcutaneous Q Mon-1800  . feeding supplement (PRO-STAT SUGAR FREE 64)  60 mL Per Tube BID  . FLUoxetine  20 mg Per Tube Daily  . free water  100 mL Per Tube Q4H  . insulin aspart  0-20 Units Subcutaneous Q4H  . insulin aspart  7 Units Subcutaneous Q4H  . insulin detemir  40 Units Subcutaneous BID  . mouth rinse  15 mL Mouth Rinse 10 times per day  . midodrine  5 mg Per Tube Q8H  . pantoprazole sodium  40 mg Per Tube Q1200  . sevelamer carbonate  2.4 g Per Tube TID WC  . sodium chloride flush  10-40 mL Intracatheter Q12H   Continuous Infusions: . sodium chloride Stopped (06/07/19 0938)  . sodium chloride Stopped (05/21/19 0940)  . bivalirudin (ANGIOMAX) infusion  0.5 mg/mL (Non-ACS indications) 0.095 mg/kg/hr (06/12/19 0800)  . feeding supplement (VITAL 1.5 CAL) 60 mL/hr at 06/11/19 1600    LOS: 51 days   Time spent: 86mn  Sharine Cadle C Kemari Mares, DO [facility]  If 7PM-7AM, please contact night-coverage [email or web address] 06/12/2019, 11:01 AM

## 2019-06-12 NOTE — Progress Notes (Signed)
Patients previous assessment indicated Core Track to be at 90cm from ear lobe to stomach. Upon looking at Core Track and tracing it back to patients stomach area, Core Track reads 135cm showing . Tube feeding turned off and stat chest x-ray obtained to verify placement. Placement was not in stomach and Bodenheimer, NP  Notified. Orders received to turn off tube feeding and he will address having the Core Track repositioned tomorrow. Will continue to monitor patient closely.

## 2019-06-12 NOTE — Progress Notes (Signed)
Patient ID: Carlos Alvin Prada Jr., male   DOB: 10/04/1985, 34 y.o.   MRN: [id number] [facility] Progress Note   Assessment/ Plan:   1. Acute kidney Injury: Multifactorial with dense ATN following ischemic and nephrotoxic injury followed by cardiogenic shock.  Was started on CRRT on 04/23/2019 and recently transitioned to intermittent hemodialysis since 05/25/2019.  He is anuric and without any evidence of renal recovery with hemodialysis on TTS schedule.  Overall prognosis is poor with sequelae of CVA and continued dialysis dependency. 2.  Acute CVA: Likely embolic with bilateral infarcts and LVEF thrombus for which he was treated with TPA on admission.  Significant residual neurological deficit with plan for LTAC. 3.  Hypoxic respiratory failure: Prolonged ventilator wean status post tracheostomy and now tolerating trach collar. 4.  Hypertension: On midodrine at this time with fair blood pressure control allowing HD. 5.  Anemia: Status post intravenous iron and now on ESA.  No overt loss.  Subjective:   Without acute events noted overnight and tolerated dialysis yesterday without problems.   Objective:   BP 118/82 (BP Location: Right Arm)   Pulse (!) 103   Temp 98.7 F (37.1 C) (Axillary)   Resp 17   Ht 6' 1" (1.854 m)   Wt 106.5 kg   SpO2 99%   BMI 30.98 kg/m   Intake/Output Summary (Last 24 hours) at 06/12/2019 0933 Last data filed at 06/12/2019 0700 Gross per 24 hour  Intake 920.53 ml  Output 2480 ml  Net -1559.47 ml   Weight change:   Physical Exam: Gen: Appears comfortable resting in bed watching TV, on trach collar CVS: Pulse regular tachycardia, S1 and S2 normal.  Right IJ TDC Resp: Coarse/transmitted breath sounds bilaterally, no rales Abd: Soft, obese, nontender Ext: No lower extremity edema  Imaging: DG Abd 1 View  Result Date: 06/11/2019 CLINICAL DATA:  Enteric catheter placement EXAM: ABDOMEN - 1 VIEW COMPARISON:  06/02/2019 FINDINGS: Supine  frontal view of the lower chest and upper abdomen was performed. No enteric catheter is identified. Bowel gas pattern is unremarkable. Lung bases are clear. IMPRESSION: 1. There is no enteric catheter identified on this exam. Chest x-ray may be useful to assess enteric catheter position. Electronically Signed   By: Michael  Brown M.D.   On: 06/11/2019 20:47   DG CHEST PORT 1 VIEW  Result Date: 06/11/2019 CLINICAL DATA:  Feeding tube placement EXAM: PORTABLE CHEST 1 VIEW COMPARISON:  June 08, 2019 FINDINGS: The tracheostomy tube terminates above the carina. The tunneled dialysis catheter is stable in positioning. There is an enteric feeding tube, the tip of which projects over the upper mediastinum. The heart size is enlarged. Diffuse hazy bilateral airspace opacities are noted. There is no pneumothorax. There are probable pleural effusions. IMPRESSION: 1. Feeding tube projects over the upper mediastinum. Repositioning is recommended. 2. Remaining lines and tubes as above. 3. Cardiomegaly with persistent diffuse hazy bilateral airspace opacities as before. These results will be called to the ordering clinician or representative by the Radiologist Assistant, and communication documented in the PACS or [facility]. Electronically Signed   By: Christopher  Green M.D.   On: 06/11/2019 23:42    Labs: BMET Recent Labs  Lab 06/06/19 0500 06/06/19 0500 06/07/19 0411 06/07/19 1430 06/08/19 0500 06/08/19 0611 06/09/19 0750 06/10/19 1157 06/11/19 1448 06/12/19 0213  NA 140   < > 139  --  137 137 137 135 135 136  K 3.3*   < > 3.0*  --  4.5   3.1* 3.2* 3.8 4.9 4.1  CL 104  --  106  --  100  --  100 97* 98 96*  CO2 18*  --  18*  --  20*  --  _0 GLUCOSE 111*  --  129*  --  186*  --  183* 188* 133* 83  BUN 86*  --  105*  --  49*  --  70* 59* 23* 35*  CREATININE 4.26*  --  4.39*  --  2.87*  --  3.39* 3.42* 2.09* 2.49*  CALCIUM 9.1  --  8.7*  --  8.3*  --  8.6* 9.4 8.8* 9.6  PHOS  --   --    --  8.3*  --   --  4.7*  --   --   --    < > = values in this interval not displayed.   CBC Recent Labs  Lab 06/08/19 0500 06/08/19 0500 06/08/19 0611 06/09/19 0750 06/10/19 1157 06/12/19 0213  WBC 13.6*  --   --  9.5 8.9 10.2  HGB 9.0*   < > 9.2* 8.8* 9.8* 10.2*  HCT 28.6*   < > 27.0* 29.3* 31.9* 33.5*  MCV 104.4*  --   --  108.1* 103.6* 104.0*  PLT 331  --   --  329 323 366   < > = values in this interval not displayed.    Medications:    . amiodarone  200 mg Per Tube BID  . ARIPiprazole  10 mg Per Tube Daily  . aspirin  81 mg Per Tube Daily  . atorvastatin  40 mg Per Tube q1800  . atropine  0.5 mg Intravenous Once  . chlorhexidine gluconate (MEDLINE KIT)  15 mL Mouth Rinse BID  . Chlorhexidine Gluconate Cloth  6 each Topical Daily  . darbepoetin (ARANESP) injection - NON-DIALYSIS  150 mcg Subcutaneous Q Mon-1800  . feeding supplement (PRO-STAT SUGAR FREE 64)  60 mL Per Tube BID  . FLUoxetine  20 mg Per Tube Daily  . free water  100 mL Per Tube Q4H  . insulin aspart  0-20 Units Subcutaneous Q4H  . insulin aspart  7 Units Subcutaneous Q4H  . insulin detemir  40 Units Subcutaneous BID  . mouth rinse  15 mL Mouth Rinse 10 times per day  . midodrine  5 mg Per Tube Q8H  . pantoprazole sodium  40 mg Per Tube Q1200  . sevelamer carbonate  2.4 g Per Tube TID WC  . sodium chloride flush  10-40 mL Intracatheter Q12H  . warfarin  7.5 mg Oral ONCE-1600  . Warfarin - Pharmacist Dosing Inpatient   Does not apply q1600      Elmarie Shiley, MD 06/12/2019, 9:33 AM

## 2019-06-12 NOTE — Plan of Care (Signed)
  Problem: Health Behavior/Discharge Planning: Goal: Ability to manage health-related needs will improve 06/12/2019 2127 by Landry Lookingbill, Kristopher Glee, RN Outcome: Progressing 06/12/2019 2127 by Roslyn Smiling, RN Outcome: Progressing   Problem: Clinical Measurements: Goal: Will remain free from infection 06/12/2019 2127 by Lanna Labella, Kristopher Glee, RN Outcome: Progressing 06/12/2019 2127 by Roslyn Smiling, RN Outcome: Progressing

## 2019-06-13 ENCOUNTER — Inpatient Hospital Stay (HOSPITAL_COMMUNITY): Payer: No Typology Code available for payment source

## 2019-06-13 DIAGNOSIS — I63312 Cerebral infarction due to thrombosis of left middle cerebral artery: Secondary | ICD-10-CM | POA: Diagnosis not present

## 2019-06-13 DIAGNOSIS — Z930 Tracheostomy status: Secondary | ICD-10-CM | POA: Diagnosis not present

## 2019-06-13 DIAGNOSIS — J9601 Acute respiratory failure with hypoxia: Secondary | ICD-10-CM | POA: Diagnosis not present

## 2019-06-13 DIAGNOSIS — I639 Cerebral infarction, unspecified: Secondary | ICD-10-CM | POA: Diagnosis not present

## 2019-06-13 DIAGNOSIS — N179 Acute kidney failure, unspecified: Secondary | ICD-10-CM | POA: Diagnosis not present

## 2019-06-13 LAB — GLUCOSE, CAPILLARY
Glucose-Capillary: 154 mg/dL — ABNORMAL HIGH (ref 70–99)
Glucose-Capillary: 142 mg/dL — ABNORMAL HIGH (ref 70–99)
Glucose-Capillary: 147 mg/dL — ABNORMAL HIGH (ref 70–99)
Glucose-Capillary: 130 mg/dL — ABNORMAL HIGH (ref 70–99)
Glucose-Capillary: 198 mg/dL — ABNORMAL HIGH (ref 70–99)
Glucose-Capillary: 199 mg/dL — ABNORMAL HIGH (ref 70–99)

## 2019-06-13 LAB — BASIC METABOLIC PANEL
Anion gap: 19 — ABNORMAL HIGH (ref 5–15)
BUN: 56 mg/dL — ABNORMAL HIGH (ref 6–20)
CO2: 20 mmol/L — ABNORMAL LOW (ref 22–32)
Calcium: 10 mg/dL (ref 8.9–10.3)
Chloride: 100 mmol/L (ref 98–111)
Creatinine, Ser: 3.58 mg/dL — ABNORMAL HIGH (ref 0.61–1.24)
GFR calc Af Amer: 24 mL/min — ABNORMAL LOW (ref >60)
GFR calc non Af Amer: 21 mL/min — ABNORMAL LOW (ref >60)
Glucose, Bld: 136 mg/dL — ABNORMAL HIGH (ref 70–99)
Potassium: 4.6 mmol/L (ref 3.5–5.1)
Sodium: 139 mmol/L (ref 135–145)

## 2019-06-13 LAB — BLOOD GAS, ARTERIAL
Acid-base deficit: 1.1 mmol/L (ref 0.0–2.0)
Bicarbonate: 21.3 mmol/L (ref 20.0–28.0)
Drawn by: 56037
FIO2: 40
O2 Saturation: 94.1 %
Patient temperature: 37
pCO2 arterial: 25.2 mmHg — ABNORMAL LOW (ref 32.0–48.0)
pH, Arterial: 7.536 — ABNORMAL HIGH (ref 7.350–7.450)
pO2, Arterial: 68.8 mmHg — ABNORMAL LOW (ref 83.0–108.0)

## 2019-06-13 LAB — CBC
HCT: 36.3 % — ABNORMAL LOW (ref 39.0–52.0)
Hemoglobin: 11 g/dL — ABNORMAL LOW (ref 13.0–17.0)
MCH: 31.6 pg (ref 26.0–34.0)
MCHC: 30.3 g/dL (ref 30.0–36.0)
MCV: 104.3 fL — ABNORMAL HIGH (ref 80.0–100.0)
Platelets: 377 10*3/uL (ref 150–400)
RBC: 3.48 MIL/uL — ABNORMAL LOW (ref 4.22–5.81)
RDW: 18.6 % — ABNORMAL HIGH (ref 11.5–15.5)
WBC: 10.3 10*3/uL (ref 4.0–10.5)
nRBC: 0 % (ref 0.0–0.2)

## 2019-06-13 LAB — PROTIME-INR
INR: 2.9 — ABNORMAL HIGH (ref 0.8–1.2)
Prothrombin Time: 30.4 seconds — ABNORMAL HIGH (ref 11.4–15.2)

## 2019-06-13 LAB — APTT: aPTT: 62 seconds — ABNORMAL HIGH (ref 24–36)

## 2019-06-13 MED ORDER — CHLORHEXIDINE GLUCONATE 0.12 % MT SOLN
15.0000 mL | Freq: Two times a day (BID) | OROMUCOSAL | Status: DC
  Administered 2019-06-13 – 2019-06-28 (×18): 15 mL via OROMUCOSAL
  Filled 2019-06-13 (×15): qty 15

## 2019-06-13 MED ORDER — MIDAZOLAM HCL 2 MG/2ML IJ SOLN
INTRAMUSCULAR | Status: AC
  Administered 2019-06-13: 1 mg
  Filled 2019-06-13: qty 2

## 2019-06-13 MED ORDER — ORAL CARE MOUTH RINSE
15.0000 mL | Freq: Two times a day (BID) | OROMUCOSAL | Status: DC
  Administered 2019-06-13 – 2019-06-28 (×29): 15 mL via OROMUCOSAL

## 2019-06-13 MED ORDER — CHLORHEXIDINE GLUCONATE CLOTH 2 % EX PADS
6.0000 | MEDICATED_PAD | Freq: Every day | CUTANEOUS | Status: DC
  Administered 2019-06-13 – 2019-06-18 (×6): 6 via TOPICAL

## 2019-06-13 MED ORDER — FENTANYL CITRATE (PF) 100 MCG/2ML IJ SOLN
INTRAMUSCULAR | Status: AC
  Administered 2019-06-13: 50 ug
  Filled 2019-06-13: qty 2

## 2019-06-13 NOTE — Progress Notes (Signed)
PROGRESS NOTE    Carlos Brewer.  MBW:[id number] DOB: [birth year] DOA: 05/14/2019 PCP: Patient, No Pcp Per   Brief Narrative:  34 yo male with history of systolic heart failure EF 15%, myocarditis, tobacco abuse, cocaine abuse, nonischemic cardiomyopathy.  Patient presented to the ED after being found to have slurred speech and profound rt sided weakness.  Admitted with left MCA CVA and multiple smaller embolic infarcts s/p tPA and thrombectomy in IR.  Additionally found to have left ventricular  thrombus. Course complicated by septic shock 2/2 HCAP, AKI requiring CRRT/dialysis, polymorphic VT and cardiogenic shock/septic shock requiring Intermittent pressor dependence, required tracheostomy due to extubation difficulties.  Assessment & Plan:   Active Problems:   Occlusion of left middle cerebral artery   Middle cerebral artery embolism, left   Cerebrovascular accident (CVA) due to thrombosis of left middle cerebral artery (HCC)   Shock ([place])   Shock liver   Respiratory failure (HCC)   Pressure injury of skin   Tracheostomy tube present ([place])   Acute CVA (cerebrovascular accident) ([place])   Acute kidney injury ([place])   Endotracheal tube present   Complication of tracheostomy tube ([place])   Acute hypoxemic respiratory failure (HCC)   Fever   Acute hypoxic hypercapnic respiratory failure in the setting of CVA, HCAP pneumonia with difficulty weaning from ventilator status post tracheostomy, stable - Patient able to wean off of ventilator now status post tracheostomy 06/08/19 - Stable at this point, continue to wean oxygen as tolerated - Defer to pulmonology for trach management at this point, appears stable - HCAP pneumonia resolved on antibiotics as below SpO2: 100 % O2 Flow Rate (L/min): 8 L/min FiO2 (%): 35 %  Acute metabolic/toxic encephalopathy in the setting of illicit substance abuse and large MCA territory infarct - Mental status continues to be somewhat limited given  large area of evolving stroke - Patient able to shake head yes or no follow simple commands but difficulty communicating - Patient's toxic encephalopathy appears to be resolved, awake appears to be alert but unable to assess orientation - Continue PT OT, disposition likely to be difficult given patient's profound weakness - Continue tube feeds per dietary given poor PO intake/swallowing difficulties - PEG tube approved by mother - ordered 06/13/19 Awaiting evaluation for placement.  Shock: Multifactorial likely cardiogenic as well as septic in the setting of above as well as HCAP pneumonia, resolved - Patient no longer on pressors, resolved infectious process after completion of antibiotics - Cardiogenic shock appears to be well managed on current regimen, defer to cardiology  Left MCA likely embolic CVA with left pontine involvement - Profound right-sided weakness although appears to be 1 out of 5 strength today on physical exam and able to follow simple commands like move foot, squeeze hand -still profoundly weak on the right compared to left - Continue PT, OT; disposition will likely need to be at skilled care given patient's profound weakness and likely total care needs, will follow up with family as patient progresses -candidate for LTAC at this point   Kidney failure in the setting of acute tubular necrosis secondary to above cardiogenic shock hypoxia and hypotension; anuric on intermittent dialysis per nephrology - Patient being followed by nephrology - CRRT initiated on 04/23/2019 and recently transitioned to intermittent hemodialysis since 05/25/2019 - now on TTS schedule - Creatinine appears to be stabilizing, unclear baseline although given patient's age and lack of history of likely was previously within normal limits - UOP continues to be extremely poor  Intake/Output Summary (Last 24 hours) at 06/13/2019 1111 Last data filed at 06/12/2019 1700 Gross per 24 hour  Intake 157.41 ml    Output 100 ml  Net 57.41 ml   Lab Results  Component Value Date   CREATININE 3.58 (H) 06/13/2019   CREATININE 2.49 (H) 06/12/2019   CREATININE 2.09 (H) 06/11/2019   HIT - Currently being managed by pharmacy - on bivalrubin; transitioning to warfarin for prolonged anticoagulation in the future  Pressure injury of skin, multiple - Wound care and nursing to follow  DVT prophylaxis: Warfarin Code Status: Full Family Communication: Mother by phone - lengthy discussion about need for PEG tube and likely prolonged therapy and placement. Disposition Plan: Continue as inpatient, likely difficult disposition given patient's new total care status, PT recommending LTAC at this point, certainly reasonable given patient's respiratory needs and physical therapy needs. Will follow up with family in the next 24 to 24 - hour as well as with case management as patient continues to stabilize. Patient will likely need prolonged physical therapy and treatment with hopes to continue to improve strength, speech and activity levels.  Significant Hospital Events   2/05 Admit, tPA, IR thrombectomy 2/6 100% on fvent at 2300  Overnight requiring increasing levophed and phenylephrine.    2/6: switched to levophed, epi, started antibiotics, started on CRRT.  2/9  developed wide-complex tachycardia and hypotension, started on amiodarone drip, increase Levophed drip 2/15 drop in platelets, heparin stopped, bival started 2/17 Hypotensive and back on Levophed 2/18 trach, lines changed 2/19 started milrinone , fever 103 2/20 Episode of wide-complex tachycardia yesterday, changed from Levophed to vasopressin 2/22 stopping vanc. Still on inotrope support. Some AF w/ RVR. Had to be placed back on pressors. ivabradine added 2/23 still on pressors/ CRRT continued 2/24 tmax 98.4, Remains on CRRT, even UF, remains anuric, Levophed at 14 mcg/min, Milrinone at 0.125 mcg/kg/min, Coox 63.4 Doing well this morning on ATC, no  events overnight.  Midodrine added   2/25 more interactive, remains on ATC, tmax 99.5/ WBC 21.4, ~900 ml liquid stool/ 24 hours, neg Cdiff, Levophed up to 20 mcg/min, CVP 2, ,milrinone remains at 0.125 mcg/kg/min, coox 92.7, Off CRRT since last night ~2000 s/p clotted x 3 off citrate, restarted  3/1 Amio drip restarted for WCT/atrial flutter, milrinone turned off 3/2 CRRT stopped, permacath placed 3/5 crrt resumed 3/6 started on pressors for crrt tolerance 3/8 No longer on CRRT or pressor support  3/12 Tolerated PS for >2 hours  3/17  febrile 106 overnight, shock on pressors, new lines placed, broad-spectrum antibiotics 3/22: Awake, appears comfortable on pressure support ventilation.  Currently weaning Precedex, he is back on low-dose norepinephrine 3/23 pm back on vent at hs for resp distress / desats ? chf on cxr  3/24 T-collar 24/7  3/26-3/28 - NG pulled out - family agreeable to PEG tube - continue PT/OT/SLP - likely candidate for LTAC in the next few days to week pending clinical course and placement options.   Consults:  Neuro IR Cardiology  Nephrology Heart Failure Team EP   Procedures:  2/5-2/6:  TPA given at 428 am (total of 90 Mg) 520 am went to IR  S/P Lt common carotid arteriogram followed by complete revascularization of occluded LT MCA sup division mid M2 seg with x 1 pass with 33mx 40 mm solitaire X ret river device and penumbra aspiration with TICI3revascularization  ETT 2/05 >> 2/18 LIJ CVL 2/5 >>2/18  RT [place] CVL 2/18 >>3/2 RIJ HD cath 2/6 >>  2/18 6 shiley cuffed trach 2/18  >>3/4  RT fem HD 2/19 >>3/2 RIJ permacath 3/2>> 3/4 shiley 4 uncuffed.... dislodged placed back 6cuffed shiley that evening 3/5: change to #6 cuffed distal XLT  3/17 right femoral CVL >> 3/23 3/17 right femoral arterial line >>3/23   Significant Diagnostic Tests:   CT angio head/neck 2/05 >> occlusion of Lt MCA bifurcation Echo 2/05 >> EF less than 20%, cannot rule out  apical thrombus MRI brain 2/11 > Extensive acute infarction of multiple areas without large or medium vessel occlusion Echocardiogram 2/8 Left ventricular ejection fraction, by estimation, is <20%. The left  ventricle has severely decreased function. The left ventricle demonstrates  global hypokinesis. The left ventricular internal cavity size was severely dilated. Possible small 0.8 x 0.6 cm apical thrombus.   3/16 duplex upper and lower extremities >>negative  3/17 head CT > New area of hypoattenuation in the left pons, left frontal encephalomalacia related to old infarct  Micro Data:  SARS CoV2 PCR 2/05 >> negative Influenza PCR 2/05 >> negative ... Cdiff 2/25 >> neg Trach asp 3/11>> Few candida albicans BC 3/11>> 1 of 4 with Coag neg staph  BC 3/12 x 2 > neg  BC 3/17 x 2 > neg   Trach aspirate 3/24 >>> no  wbc, no organisms final - nl flora   Antimicrobials:  Meropenem 2/6 -2/12 Zosyn 2/6 , 2/19 >> 2/23 vanc 2/6 >> 2/9 , 2/19 >>2/22 Ceftriaxone 3/12> 3/16 3/17 vanc >> 3/19 3/17 Anidulafungin >> 3/19 3/17 cefepime >>  3/22  Objective: Vitals:   06/13/19 0600 06/13/19 0700 06/13/19 0744 06/13/19 0906  BP: 125/84 101/82 104/70   Pulse: (!) 107 (!) 107 (!) 103 (!) 107  Resp:   20 (!) 31  Temp:   98.6 F (37 C)   TempSrc:   Oral   SpO2: 95% (!) 87% 100%   Weight:      Height:        Intake/Output Summary (Last 24 hours) at 06/13/2019 1111 Last data filed at 06/12/2019 1700 Gross per 24 hour  Intake 157.41 ml  Output 100 ml  Net 57.41 ml   Filed Weights   06/09/19 0755 06/09/19 1132 06/11/19 1153  Weight: 108.6 kg 105 kg 106.5 kg    Examination:   General: Pleasantly resting in bed, No acute distress. Awake, alert, unable to assess orientation given speech issues. HEENT: Normocephalic atraumatic.  Sclerae nonicteric, noninjected.  Extraocular movements intact bilaterally. Coretrak NG in place. Neck: Trach-collar without bleeding or drainage. Lungs: Clear  to auscultate bilaterally without rhonchi, wheeze, or rales. Heart: Regular rate and rhythm.  Without murmurs, rubs, or gallops. Abdomen: Soft, nontender, nondistended.  Without guarding or rebound. Extremities: Without cyanosis, clubbing, edema, or obvious deformity. Left-sided strength 3/5, right-sided strength 1/5 Vascular: Dorsalis pedis and posterior tibial pulses palpable bilaterally. Skin: Warm and dry, no erythema, Sacral skin pressure sore. Left heel pressure injury.  Data Reviewed: I have personally reviewed following labs and imaging studies  CBC: Recent Labs  Lab 06/08/19 0500 06/08/19 0500 06/08/19 0611 06/09/19 0750 06/10/19 1157 06/12/19 0213 06/13/19 0841  WBC 13.6*  --   --  9.5 8.9 10.2 10.3  HGB 9.0*   < > 9.2* 8.8* 9.8* 10.2* 11.0*  HCT 28.6*   < > 27.0* 29.3* 31.9* 33.5* 36.3*  MCV 104.4*  --   --  108.1* 103.6* 104.0* 104.3*  PLT 331  --   --  329 323 366 377   < > =  values in this interval not displayed.   Basic Metabolic Panel: Recent Labs  Lab 06/07/19 1430 06/08/19 0500 06/09/19 0750 06/10/19 1157 06/11/19 1448 06/12/19 0213 06/13/19 0841  NA  --    < > 137 135 135 136 139  K  --    < > 3.2* 3.8 4.9 4.1 4.6  CL  --    < > 100 97* 98 96* 100  CO2  --    < > 23 22 23 22  20*  GLUCOSE  --    < > 183* 188* 133* 83 136*  BUN  --    < > 70* 59* 23* 35* 56*  CREATININE  --    < > 3.39* 3.42* 2.09* 2.49* 3.58*  CALCIUM  --    < > 8.6* 9.4 8.8* 9.6 10.0  PHOS 8.3*  --  4.7*  --   --   --   --    < > = values in this interval not displayed.   GFR: Estimated Creatinine Clearance: 36.9 mL/min (A) (by C-G formula based on SCr of 3.58 mg/dL (H)). Liver Function Tests: Recent Labs  Lab 06/07/19 0411 06/08/19 0500 06/09/19 0750 06/10/19 1157  AST 36 91*  --  35  ALT 30 27  --  29  ALKPHOS 132* 156*  --  162*  BILITOT 0.9 2.1*  --  1.1  PROT 6.3* 6.2*  --  7.0  ALBUMIN 2.4* 2.6* 2.5* 2.7*   No results for input(s): LIPASE, AMYLASE in the last 168  hours. No results for input(s): AMMONIA in the last 168 hours. Coagulation Profile: Recent Labs  Lab 06/09/19 0647 06/10/19 0618 06/11/19 1152 06/12/19 0456 06/13/19 0841  INR 1.8* 1.6* 2.1* 2.4* 2.9*   Cardiac Enzymes: No results for input(s): CKTOTAL, CKMB, CKMBINDEX, TROPONINI in the last 168 hours. BNP (last 3 results) No results for input(s): PROBNP in the last 8760 hours. HbA1C: No results for input(s): HGBA1C in the last 72 hours. CBG: Recent Labs  Lab 06/12/19 1657 06/12/19 1937 06/12/19 2330 06/13/19 0347 06/13/19 0757  GLUCAP 127* 122* 143* 142* 147*   Lipid Profile: No results for input(s): CHOL, HDL, LDLCALC, TRIG, CHOLHDL, LDLDIRECT in the last 72 hours. Thyroid Function Tests: No results for input(s): TSH, T4TOTAL, FREET4, T3FREE, THYROIDAB in the last 72 hours. Anemia Panel: No results for input(s): VITAMINB12, FOLATE, FERRITIN, TIBC, IRON, RETICCTPCT in the last 72 hours. Sepsis Labs: No results for input(s): PROCALCITON, LATICACIDVEN in the last 168 hours.  Recent Results (from the past 240 hour(s))  Culture, respiratory (non-expectorated)     Status: None   Collection Time: 06/08/19  8:08 AM   Specimen: Tracheal Aspirate; Respiratory  Result Value Ref Range Status   Specimen Description TRACHEAL ASPIRATE  Final   Special Requests Normal  Final   Gram Stain NO WBC SEEN NO ORGANISMS SEEN   Final   Culture   Final    FEW Consistent with normal respiratory flora. Performed at [facility], [place] [address], [place], [place] [address]    Report Status 06/10/2019 FINAL  Final    Radiology Studies: DG Abd 1 View  Result Date: 06/11/2019 CLINICAL DATA:  Enteric catheter placement EXAM: ABDOMEN - 1 VIEW COMPARISON:  06/02/2019 FINDINGS: Supine frontal view of the lower chest and upper abdomen was performed. No enteric catheter is identified. Bowel gas pattern is unremarkable. Lung bases are clear. IMPRESSION: 1. There is no enteric catheter  identified on this exam. Chest x-ray may  be useful to assess enteric catheter position. Electronically Signed   By: Randa Ngo M.D.   On: 06/11/2019 20:47   DG CHEST PORT 1 VIEW  Result Date: 06/11/2019 CLINICAL DATA:  Feeding tube placement EXAM: PORTABLE CHEST 1 VIEW COMPARISON:  June 08, 2019 FINDINGS: The tracheostomy tube terminates above the carina. The tunneled dialysis catheter is stable in positioning. There is an enteric feeding tube, the tip of which projects over the upper mediastinum. The heart size is enlarged. Diffuse hazy bilateral airspace opacities are noted. There is no pneumothorax. There are probable pleural effusions. IMPRESSION: 1. Feeding tube projects over the upper mediastinum. Repositioning is recommended. 2. Remaining lines and tubes as above. 3. Cardiomegaly with persistent diffuse hazy bilateral airspace opacities as before. These results will be called to the ordering clinician or representative by the Radiologist Assistant, and communication documented in the PACS or [employer]. Electronically Signed   By: Constance Holster M.D.   On: 06/11/2019 23:42   Scheduled Meds: . amiodarone  200 mg Per Tube BID  . ARIPiprazole  10 mg Per Tube Daily  . aspirin  81 mg Per Tube Daily  . atorvastatin  40 mg Per Tube q1800  . atropine  0.5 mg Intravenous Once  . chlorhexidine gluconate (MEDLINE KIT)  15 mL Mouth Rinse BID  . Chlorhexidine Gluconate Cloth  6 each Topical Daily  . Chlorhexidine Gluconate Cloth  6 each Topical Q0600  . darbepoetin (ARANESP) injection - NON-DIALYSIS  150 mcg Subcutaneous Q Mon-1800  . feeding supplement (PRO-STAT SUGAR FREE 64)  60 mL Per Tube BID  . FLUoxetine  20 mg Per Tube Daily  . free water  100 mL Per Tube Q4H  . insulin aspart  0-20 Units Subcutaneous Q4H  . insulin aspart  7 Units Subcutaneous Q4H  . insulin detemir  40 Units Subcutaneous BID  . mouth rinse  15 mL Mouth Rinse 10 times per day  . midodrine  5 mg Per Tube Q8H    . pantoprazole sodium  40 mg Per Tube Q1200  . sevelamer carbonate  2.4 g Per Tube TID WC  . sodium chloride flush  10-40 mL Intracatheter Q12H   Continuous Infusions: . sodium chloride Stopped (06/07/19 0938)  . sodium chloride Stopped (05/21/19 0940)  . bivalirudin (ANGIOMAX) infusion 0.5 mg/mL (Non-ACS indications) 0.085 mg/kg/hr (06/13/19 0247)  . feeding supplement (VITAL 1.5 CAL) 60 mL/hr at 06/11/19 1600    LOS: 52 days   Time spent: 57mn  Carlos Gul C Rhylynn Perdomo, DO [facility]  If 7PM-7AM, please contact night-coverage [email or web address] 06/13/2019, 11:11 AM

## 2019-06-13 NOTE — Progress Notes (Signed)
Dr A MD  CCM placed #6 Shiley cuffless

## 2019-06-13 NOTE — Progress Notes (Signed)
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   By: Anner Crete M.D.   On: 05/20/2019 18:51   DG CHEST PORT 1 VIEW  Result Date: 05/20/2019 CLINICAL DATA:  34 year old male with history of acute respiratory failure with hypoxemia. EXAM: PORTABLE CHEST 1 VIEW COMPARISON:  Chest x-ray 05/20/2019. FINDINGS: A tracheostomy tube is in place with tip 5.0 cm above the carina. Right internal jugular PermCath with tip terminating in the distal superior vena cava. A feeding tube is seen extending into the abdomen, however, the tip of the feeding tube extends below the lower margin of the image. Patchy multifocal airspace consolidation again noted throughout the lungs bilaterally (left greater than right), without significant improvement in aeration. No pleural effusions. Pulmonary vasculature is obscured. Heart size is mildly enlarged. Upper mediastinal contours are obscured. IMPRESSION: 1. Support apparatus, as above. 2. Severe multilobar pneumonia redemonstrated, as above. No significant change. 3. Cardiomegaly. Electronically Signed   By: Vinnie Langton M.D.   On: 05/20/2019 14:52   DG CHEST PORT 1  VIEW  Result Date: 05/20/2019 CLINICAL DATA:  Respiratory distress. Shortness of breath. History of heart failure, myocarditis. EXAM: PORTABLE CHEST 1 VIEW COMPARISON:  Radiographs 05/19/2019 and  05/10/2019. CT 12/13/2018. FINDINGS: 1136 hours. The tracheostomy, feeding tube and right IJ dialysis catheter appear unchanged. There are worsening bilateral airspace opacities with confluent components, especially in the right upper lobe. No pneumothorax or significant pleural effusion. The heart is partly obscured by these airspace opacities. No significant osseous abnormalities. IMPRESSION: Significant worsening of bilateral airspace opacities since yesterday, consistent with edema, hemorrhage or infection. No pneumothorax or significant pleural effusion. Stable support system. Electronically Signed   By: Richardean Sale M.D.   On: 05/20/2019 11:52   Portable Chest x-ray  Result Date: 05/19/2019 CLINICAL DATA:  34 year old male with shortness of breath. EXAM: PORTABLE CHEST 1 VIEW COMPARISON:  Chest radiograph dated 05/10/2019. FINDINGS: Tracheostomy with tip approximately 5 cm above the carina. A feeding tube extends below the diaphragm with tip beyond the inferior margin of the image. Right sided dialysis catheter with tip in the region of the cavoatrial junction. There is diffuse interstitial and vascular prominence and hazy airspace opacity most consistent with edema. Pneumonia is not excluded. Clinical correlation is recommended. No large pleural effusion. No pneumothorax. There is cardiomegaly. No acute osseous pathology. IMPRESSION: Cardiomegaly with findings of edema or fluid overload. Pneumonia is not excluded. Clinical correlation is recommended. Electronically Signed   By: Anner Crete M.D.   On: 05/19/2019 21:57   DG Abd Portable 1V  Result Date: 06/02/2019 CLINICAL DATA:  OG tube placement EXAM: PORTABLE ABDOMEN - 1 VIEW COMPARISON:  05/25/2019 FINDINGS: OG tube is in the stomach. Mild gaseous  distention of the stomach. No evidence of bowel obstruction, organomegaly or free air. IMPRESSION: OG tube tip in the stomach. Electronically Signed   By: Rolm Baptise M.D.   On: 06/02/2019 08:54   DG Abd Portable 1V  Result Date: 05/25/2019 CLINICAL DATA:  Check gastric catheter placement EXAM: PORTABLE ABDOMEN - 1 VIEW COMPARISON:  Film from earlier in the same day. FINDINGS: Previously seen feeding catheter in the mid esophagus has been removed. New nasogastric catheter has been advanced into the stomach. IMPRESSION: Gastric catheter within the stomach. Electronically Signed   By: Inez Catalina M.D.   On: 05/25/2019 22:16   DG Abd Portable 1V  Result Date: 05/25/2019 CLINICAL DATA:  Check gastric catheter placement EXAM: PORTABLE ABDOMEN - 1 VIEW COMPARISON:  05/25/2019 FINDINGS: Tracheostomy tube and right jugular central line are again noted and stable. Cardiomegaly is again seen. Previously seen feeding catheter has been withdrawn and now lies in the distal esophagus. This should be advanced several cm. IMPRESSION: Feeding catheter within the mid esophagus. This should be advanced several cm into the stomach. Electronically Signed   By: Inez Catalina M.D.   On: 05/25/2019 21:26   VAS [place] LOWER EXTREMITY VENOUS (DVT)  Result Date: 05/31/2019  Lower Venous DVTStudy Indications: Fever, prolonged bed confinement.  Risk Factors: Immobility. Limitations: Body habitus, poor ultrasound/tissue interface and uncooperative. Comparison Study: No prior exam. Performing Technologist: Baldwin Crown ARDMS, RVT  Examination Guidelines: A complete evaluation includes B-mode imaging, spectral Doppler, color Doppler, and power Doppler as needed of all accessible portions of each vessel. Bilateral testing is considered an integral part of a complete examination. Limited examinations for reoccurring indications may be performed as noted. The reflux portion of the exam is performed with the patient in reverse  Trendelenburg.  +---------+---------------+---------+-----------+----------+-----------------+ RIGHT    CompressibilityPhasicitySpontaneityPropertiesThrombus Aging    +---------+---------------+---------+-----------+----------+-----------------+ CFV      Full           Yes      Yes                                    +---------+---------------+---------+-----------+----------+-----------------+  SFJ      Full                                                           +---------+---------------+---------+-----------+----------+-----------------+ FV Prox  Full                                                           +---------+---------------+---------+-----------+----------+-----------------+ FV Mid   Full                                                           +---------+---------------+---------+-----------+----------+-----------------+ FV DistalFull                                                           +---------+---------------+---------+-----------+----------+-----------------+ PFV      Full                                                           +---------+---------------+---------+-----------+----------+-----------------+ POP      Full           Yes      Yes                                    +---------+---------------+---------+-----------+----------+-----------------+ PTV      Full                                         poorly visualized +---------+---------------+---------+-----------+----------+-----------------+ PERO     Full                                         poorly visualized +---------+---------------+---------+-----------+----------+-----------------+   +---------+---------------+---------+-----------+----------+-----------------+ LEFT     CompressibilityPhasicitySpontaneityPropertiesThrombus Aging    +---------+---------------+---------+-----------+----------+-----------------+ CFV      Full           Yes       Yes                                    +---------+---------------+---------+-----------+----------+-----------------+ SFJ      Full                                                           +---------+---------------+---------+-----------+----------+-----------------+  FV Prox  Full                                                           +---------+---------------+---------+-----------+----------+-----------------+ FV Mid   Full                                                           +---------+---------------+---------+-----------+----------+-----------------+ FV DistalFull                                                           +---------+---------------+---------+-----------+----------+-----------------+ PFV      Full                                                           +---------+---------------+---------+-----------+----------+-----------------+ POP      Full           Yes      Yes                                    +---------+---------------+---------+-----------+----------+-----------------+ PTV      Full                                         poorly visualized +---------+---------------+---------+-----------+----------+-----------------+ PERO     Full                                         poorly visualized +---------+---------------+---------+-----------+----------+-----------------+     Summary: RIGHT: - There is no evidence of deep vein thrombosis in the lower extremity. However, portions of this examination were limited- see technologist comments above.  - No cystic structure found in the popliteal fossa.  LEFT: - There is no evidence of deep vein thrombosis in the lower extremity. However, portions of this examination were limited- see technologist comments above.  - No cystic structure found in the popliteal fossa.  *See table(s) above for measurements and observations. Electronically signed by Harold Barban MD on 05/31/2019 at  9:41:23 PM.    Final    VAS [place] UPPER EXTREMITY VENOUS DUPLEX  Result Date: 05/31/2019 UPPER VENOUS STUDY  Indications: fever Limitations: Body habitus, bandages and patient cooperation. Performing Technologist: Antonieta Pert RDMS, RVT  Examination Guidelines: A complete evaluation includes B-mode imaging, spectral Doppler, color Doppler, and power Doppler as needed of all accessible portions of each vessel. Bilateral testing is considered an integral part of a complete examination. Limited examinations for reoccurring indications may be performed as noted.  Right Findings: +----------+------------+---------+-----------+----------+--------------+ RIGHT  CompressiblePhasicitySpontaneousProperties   Summary     +----------+------------+---------+-----------+----------+--------------+ IJV                                                 Not visualized +----------+------------+---------+-----------+----------+--------------+ Subclavian    Full       Yes       Yes                             +----------+------------+---------+-----------+----------+--------------+ Axillary      Full       Yes       Yes                             +----------+------------+---------+-----------+----------+--------------+ Brachial      Full                                                 +----------+------------+---------+-----------+----------+--------------+ Radial        Full                                                 +----------+------------+---------+-----------+----------+--------------+ Ulnar                                               Not visualized +----------+------------+---------+-----------+----------+--------------+ Cephalic    Partial                                                +----------+------------+---------+-----------+----------+--------------+ Basilic                                             Not visualized  +----------+------------+---------+-----------+----------+--------------+  Left Findings: +----------+------------+---------+-----------+----------+--------------+ LEFT      CompressiblePhasicitySpontaneousProperties   Summary     +----------+------------+---------+-----------+----------+--------------+ IJV                                                 Not visualized +----------+------------+---------+-----------+----------+--------------+ Subclavian    Full       Yes       Yes                             +----------+------------+---------+-----------+----------+--------------+ Axillary      Full       Yes       Yes                             +----------+------------+---------+-----------+----------+--------------+ Brachial  Full                                    duplicated   +----------+------------+---------+-----------+----------+--------------+ Radial        Full                                                 +----------+------------+---------+-----------+----------+--------------+ Ulnar                                               Not visualized +----------+------------+---------+-----------+----------+--------------+ Cephalic                                            Not visualized +----------+------------+---------+-----------+----------+--------------+ Basilic                                             Not visualized +----------+------------+---------+-----------+----------+--------------+  Summary:  Right: No evidence of deep vein thrombosis in the upper extremity. IJV not visualized due to trach colar, basilic and ulnar not visualized due to patient position and cooperation.  Left: No evidence of deep vein thrombosis in the upper extremity. IJV not visualized due to trach colar. Basilic and ulnar not visualized due to patient position and cooperation. Cephalic not clearly visualized. Small focal area of thrombus near punctore site.  Unable to determine if it is cephalic or accessory vein.  *See table(s) above for measurements and observations.  Diagnosing physician: Harold Barban MD Electronically signed by Harold Barban MD on 05/31/2019 at 9:41:36 PM.    Final    [place] EKG SITE RITE  Result Date: 06/06/2019 If Site Rite image not attached, placement could not be confirmed due to current cardiac rhythm.   Labs:  CBC: Recent Labs    06/09/19 0750 06/10/19 1157 06/12/19 0213 06/13/19 0841  WBC 9.5 8.9 10.2 10.3  HGB 8.8* 9.8* 10.2* 11.0*  HCT 29.3* 31.9* 33.5* 36.3*  PLT 329 323 366 377    COAGS: Recent Labs    06/10/19 0618 06/10/19 1157 06/11/19 1152 06/11/19 1448 06/11/19 2311 06/12/19 0456 06/12/19 1148 06/13/19 0841  INR 1.6*  --  2.1*  --   --  2.4*  --  2.9*  APTT  --    < > 52*   < > 26 60* 87* 62*   < > = values in this interval not displayed.    BMP: Recent Labs    06/10/19 1157 06/11/19 1448 06/12/19 0213 06/13/19 0841  NA 135 135 136 139  K 3.8 4.9 4.1 4.6  CL 97* 98 96* 100  CO2 22 23 22  20*  GLUCOSE 188* 133* 83 136*  BUN 59* 23* 35* 56*  CALCIUM 9.4 8.8* 9.6 10.0  CREATININE 3.42* 2.09* 2.49* 3.58*  GFRNONAA 22* 40* 32* 21*  GFRAA 25* 46* 37* 24*    LIVER FUNCTION TESTS: Recent Labs    06/06/19 0500 06/06/19 0500 06/07/19 0411 06/08/19 0500 06/09/19 0750 06/10/19 1157  BILITOT  1.0  --  0.9 2.1*  --  1.1  AST 30  --  36 91*  --  35  ALT 28  --  30 27  --  29  ALKPHOS 145*  --  132* 156*  --  162*  PROT 6.5  --  6.3* 6.2*  --  7.0  ALBUMIN 2.4*   < > 2.4* 2.6* 2.5* 2.7*   < > = values in this interval not displayed.    TUMOR MARKERS: No results for input(s): AFPTM, CEA, CA199, CHROMGRNA in the last 8760 hours.  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  Currently in need of gastrostomy tube placement.  CT Abdomen reviewed by Dr. Vernard Gambles who approves patient for percutaneous  attempt.  He is currently on coumadin. Last dose 3/27.  Plan for possible placement later this week.  Unfortunately, patient pulled his NGT.  This will have to be replaced until alternative access can be obtained.  Discussed procedure with mother at bedside. She is agreeable to move forward.   Risks and benefits image guided gastrostomy tube placement was discussed with the patient including, but not limited to the need for a barium enema during the procedure, bleeding, infection, peritonitis and/or damage to adjacent structures.  All of the patient's questions were answered, patient is agreeable to proceed.  Consent signed and in chart.  Thank you for this interesting consult.  I greatly enjoyed meeting Carlos Brewer. and look forward to participating in their care.  A copy of this report was sent to the requesting provider on this date.  Electronically Signed: Docia Barrier, PA 06/13/2019, 3:56 PM   I spent a total of 40 Minutes    in face to face in clinical consultation, greater than 50% of which was counseling/coordinating care for dysphagia.

## 2019-06-13 NOTE — Consult Note (Signed)
WOC Nurse Consult Note: Reason for Consult: groin wound, DTPI to left heel Wound type: Medical Device Related Pressure Injury measuring 1 cm x 0.2 cm covered with granulating tissue moist, and no odor or drainage present, peri-wound intact.  Order placed for foam dressing to groin.  DTPI to left heel measuring 3.0 cm x 2.6 cm x 0.3cm small portion of wound is open Xeroform applied to open area and covered with foam, Wound bed is light pink and dry, no drainage or odor present.  Periwound is intact. Order placed for Xeroform to left heel to be changed daily and foam to be changed every three days or PRN for soilage, RN may lift the foam away and change the Xeroform daily and replace the foam dressing over top.   Discussed POC with patient and bedside nurse.  Monitor the wound area's for worsening of condition such as, Signs/symptoms of infection, Increase in size, development of or worsening of odor, development of pain, or increased pain at the affected locations.  Notify the medical team if any of these develop.  Thank you for the consult.  Discussed plan of care with the bedside nurse.  [place] nurse will continue to follow.  Austin Miles, RN, [employer]

## 2019-06-13 NOTE — Progress Notes (Signed)
RT changed trach with second RT. Patient did not tolerate well with resisting reinsertion. MD came to bedside to assist with trach insertion and trach was placed with good color change. Chest Xray was ordered. MD verified trach placement. RT will continue to monitor.

## 2019-06-13 NOTE — Plan of Care (Signed)
  Problem: Nutrition: Goal: Adequate nutrition will be maintained Outcome: Progressing   

## 2019-06-13 NOTE — Progress Notes (Signed)
[facility] ROUNDING NOTE   Subjective:   Brief history this is a 34 year old gentleman with a history of systolic heart failure myocarditis tobacco abuse cocaine abuse nonischemic cardiomyopathy.  He had presented to the emergency room 05/14/2019 with a left MCA CVA multiple small embolic infarcts status post TPA thrombectomy in IR.  His hospital course been complicated by septic shock hospital-acquired pneumonia acute kidney injury requiring CRRT and ultimately dialysis polymorphic ventricular tachycardia and cardiogenic shock/septic shock requiring intermittent pressor dependence.  Due to difficulty with vent wean he has required tracheostomy 05/05/2019.  The plan is for placement of PEG tube and LTAC.  Blood pressure 125/84 pulse 109 temperature 98.6 O2 sats 95% 40% FiO2 through trach collar  Sodium 136 potassium 4.1 chloride 96 CO2 22 BUN 35 creatinine 2.49 glucose 83 calcium 9.6 WBC 10.2 hemoglobin 10.2 platelets 366 INR 2.4  Amiodarone 200 mg twice daily, Abilify 10 mg daily aspirin 81 mg daily atorvastatin 40 mg daily darbepoetin 150 mcg q. Monday Prozac 20 mg daily, insulin 7 units every 4 hours, insulin Levemir 40 units twice daily midodrine 5 mg every 8 hours Protonix 40 mg daily Renvela 2.4 g 3 times daily Coumadin dosing per pharmacy, Angiomax  Last chest x-ray 08 June 2019 cardiomegaly with persistent diffuse hazy bilateral airspace opacities unchanged. Abdominal x-ray no enteric catheter identified  CT scan of head 06/01/2019 likely evolving region of encephalomalacia right frontal lobe corresponding with prior infarcts on MRI  Objective:  Vital signs in last 24 hours:  Temp:  [98.1 F (36.7 C)-98.7 F (37.1 C)] 98.6 F (37 C) (03/28 2300) Pulse Rate:  [54-113] 107 (03/29 0600) Resp:  [17-28] 24 (03/29 0400) BP: (101-199)/(55-105) 125/84 (03/29 0600) SpO2:  [90 %-100 %] 95 % (03/29 0600) FiO2 (%):  [40 %] 40 % (03/29 0400)  Weight change:  Filed Weights    06/09/19 0755 06/09/19 1132 06/11/19 1153  Weight: 108.6 kg 105 kg 106.5 kg    Intake/Output: I/O last 3 completed shifts: In: 1410.3 [I.V.:550.3; NG/GT:860] Out: 2580 [Urine:460; Other:2000; Stool:120]   Intake/Output this shift:  No intake/output data recorded.  Gen: Appears comfortable on trach collar CVS: Pulse regular tachycardia, S1 and S2 normal.  Right IJ TDC Resp: Coarse/transmitted breath sounds bilaterally, no rales Abd: Soft, obese, nontender Ext: No lower extremity edema   Basic Metabolic Panel: Recent Labs  Lab 06/07/19 0411 06/07/19 1430 06/08/19 0500 06/08/19 0500 06/08/19 0611 06/09/19 0750 06/09/19 0750 06/10/19 1157 06/11/19 1448 06/12/19 0213  NA   < >  --  137   < > 137 137  --  135 135 136  K   < >  --  4.5   < > 3.1* 3.2*  --  3.8 4.9 4.1  CL   < >  --  100  --   --  100  --  97* 98 96*  CO2   < >  --  20*  --   --  23  --  22 23 22   GLUCOSE   < >  --  186*  --   --  183*  --  188* 133* 83  BUN   < >  --  49*  --   --  70*  --  59* 23* 35*  CREATININE   < >  --  2.87*  --   --  3.39*  --  3.42* 2.09* 2.49*  CALCIUM   < >  --  8.3*   < >  --  8.6*   < > 9.4 8.8* 9.6  PHOS  --  8.3*  --   --   --  4.7*  --   --   --   --    < > = values in this interval not displayed.    Liver Function Tests: Recent Labs  Lab 06/07/19 0411 06/08/19 0500 06/09/19 0750 06/10/19 1157  AST 36 91*  --  35  ALT 30 27  --  29  ALKPHOS 132* 156*  --  162*  BILITOT 0.9 2.1*  --  1.1  PROT 6.3* 6.2*  --  7.0  ALBUMIN 2.4* 2.6* 2.5* 2.7*   No results for input(s): LIPASE, AMYLASE in the last 168 hours. No results for input(s): AMMONIA in the last 168 hours.  CBC: Recent Labs  Lab 06/07/19 0411 06/07/19 0411 06/08/19 0500 06/08/19 0611 06/09/19 0750 06/10/19 1157 06/12/19 0213  WBC 8.1  --  13.6*  --  9.5 8.9 10.2  HGB 7.6*   < > 9.0* 9.2* 8.8* 9.8* 10.2*  HCT 24.8*   < > 28.6* 27.0* 29.3* 31.9* 33.5*  MCV 105.5*  --  104.4*  --  108.1* 103.6* 104.0*   PLT 241  --  331  --  329 323 366   < > = values in this interval not displayed.    Cardiac Enzymes: No results for input(s): CKTOTAL, CKMB, CKMBINDEX, TROPONINI in the last 168 hours.  BNP: Invalid input(s): POCBNP  CBG: Recent Labs  Lab 06/12/19 1153 06/12/19 1657 06/12/19 1937 06/12/19 2330 06/13/19 0347  GLUCAP 108* 127* 122* 143* 142*    Microbiology: Results for orders placed or performed during the hospital encounter of 05/11/2019  Respiratory Panel by RT PCR (Flu A&B, Covid) - Nasopharyngeal Swab     Status: None   Collection Time: 05/12/2019  4:42 AM   Specimen: Nasopharyngeal Swab  Result Value Ref Range Status   SARS Coronavirus 2 by RT PCR NEGATIVE NEGATIVE Final    Comment: (NOTE) SARS-CoV-2 target nucleic acids are NOT DETECTED. The SARS-CoV-2 RNA is generally detectable in upper respiratoy specimens during the acute phase of infection. The lowest concentration of SARS-CoV-2 viral copies this assay can detect is 131 copies/mL. A negative result does not preclude SARS-Cov-2 infection and should not be used as the sole basis for treatment or other patient management decisions. A negative result may occur with  improper specimen collection/handling, submission of specimen other than nasopharyngeal swab, presence of viral mutation(s) within the areas targeted by this assay, and inadequate number of viral copies (<131 copies/mL). A negative result must be combined with clinical observations, patient history, and epidemiological information. The expected result is Negative. Fact Sheet for Patients:  PinkCheek.be Fact Sheet for Healthcare Providers:  GravelBags.it This test is not yet ap proved or cleared by the [place] FDA and  has been authorized for detection and/or diagnosis of SARS-CoV-2 by FDA under an Emergency Use Authorization (EUA). This EUA will remain  in effect (meaning this test can be  used) for the duration of the COVID-19 declaration under Section 564(b)(1) of the Act, 21 U.S.C. section 360bbb-3(b)(1), unless the authorization is terminated or revoked sooner.    Influenza A by PCR NEGATIVE NEGATIVE Final   Influenza B by PCR NEGATIVE NEGATIVE Final    Comment: (NOTE) The Xpert Xpress SARS-CoV-2/FLU/RSV assay is intended as an aid in  the diagnosis of influenza from Nasopharyngeal swab specimens and  should not be used as a sole basis for  treatment. Nasal washings and  aspirates are unacceptable for Xpert Xpress SARS-CoV-2/FLU/RSV  testing. Fact Sheet for Patients: PinkCheek.be Fact Sheet for Healthcare Providers: GravelBags.it This test is not yet approved or cleared by the [place] FDA and  has been authorized for detection and/or diagnosis of SARS-CoV-2 by  FDA under an Emergency Use Authorization (EUA). This EUA will remain  in effect (meaning this test can be used) for the duration of the  Covid-19 declaration under Section 564(b)(1) of the Act, 21  U.S.C. section 360bbb-3(b)(1), unless the authorization is  terminated or revoked. Performed at [facility], [place] [address], [place], [place] [address]   MRSA PCR Screening     Status: Abnormal   Collection Time: 05/14/2019  1:56 PM   Specimen: Nasal Mucosa; Nasopharyngeal  Result Value Ref Range Status   MRSA by PCR POSITIVE (A) NEGATIVE Final    Comment:        The GeneXpert MRSA Assay (FDA approved for NASAL specimens only), is one component of a comprehensive MRSA colonization surveillance program. It is not intended to diagnose MRSA infection nor to guide or monitor treatment for MRSA infections. RESULT CALLED TO, READ BACK BY AND VERIFIED WITH: Adrienne Mocha RN 16:20 05/12/2019 ([email or web address]) Performed at [facility], [place] [address] [place], [place] [address]   Culture, respiratory (non-expectorated)     Status: None   Collection  Time: 04/23/19 10:58 AM   Specimen: Tracheal Aspirate; Respiratory  Result Value Ref Range Status   Specimen Description TRACHEAL ASPIRATE  Final   Special Requests NONE  Final   Gram Stain   Final    FEW WBC PRESENT, PREDOMINANTLY PMN FEW GRAM NEGATIVE COCCOBACILLI FEW GRAM POSITIVE COCCI IN PAIRS IN CLUSTERS RARE GRAM POSITIVE RODS    Culture   Final    ABUNDANT Consistent with normal respiratory flora. Performed at [facility], [place] [address] [place], [place] [address]    Report Status 04/25/2019 FINAL  Final  Culture, blood (Routine X 2) w Reflex to ID Panel     Status: None   Collection Time: 04/23/19 11:20 AM   Specimen: BLOOD  Result Value Ref Range Status   Specimen Description BLOOD RIGHT ANTECUBITAL  Final   Special Requests   Final    AEROBIC BOTTLE ONLY Blood Culture results may not be optimal due to an inadequate volume of blood received in culture bottles   Culture   Final    NO GROWTH 5 DAYS Performed at [facility], [place] [address], [place], [place] [address]    Report Status 04/28/2019 FINAL  Final  Culture, blood (Routine X 2) w Reflex to ID Panel     Status: None   Collection Time: 04/23/19 11:21 AM   Specimen: BLOOD  Result Value Ref Range Status   Specimen Description BLOOD RIGHT ANTECUBITAL  Final   Special Requests   Final    AEROBIC BOTTLE ONLY Blood Culture results may not be optimal due to an inadequate volume of blood received in culture bottles   Culture   Final    NO GROWTH 5 DAYS Performed at [facility], [place] [address] [place], [place] [address]    Report Status 04/28/2019 FINAL  Final  Culture, Urine     Status: None   Collection Time: 04/23/19  1:56 PM   Specimen: Urine, Random  Result Value Ref Range Status   Specimen Description URINE, RANDOM  Final   Special Requests  Final    NONE Performed at [facility], [place] [address] [place], [place] [address]    Culture NO GROWTH  Final   Report Status  04/25/2019 FINAL  Final  Culture, respiratory (non-expectorated)     Status: None   Collection Time: 05/03/19 11:23 AM   Specimen: Tracheal Aspirate; Respiratory  Result Value Ref Range Status   Specimen Description TRACHEAL ASPIRATE  Final   Special Requests NONE  Final   Gram Stain   Final    MODERATE WBC PRESENT, PREDOMINANTLY PMN RARE GRAM POSITIVE COCCI    Culture   Final    Consistent with normal respiratory flora. Performed at [facility], [place] [address] [place], [place] [address]    Report Status 05/05/2019 FINAL  Final  Culture, blood (Routine X 2) w Reflex to ID Panel     Status: None   Collection Time: 05/06/19 11:45 AM   Specimen: BLOOD LEFT ARM  Result Value Ref Range Status   Specimen Description BLOOD LEFT ARM  Final   Special Requests   Final    BOTTLES DRAWN AEROBIC ONLY Blood Culture adequate volume   Culture   Final    NO GROWTH 5 DAYS Performed at [facility], [place] [address], [place], [place] [address]    Report Status 05/11/2019 FINAL  Final  Culture, blood (Routine X 2) w Reflex to ID Panel     Status: None   Collection Time: 05/06/19 12:20 PM   Specimen: BLOOD RIGHT ARM  Result Value Ref Range Status   Specimen Description BLOOD RIGHT ARM  Final   Special Requests   Final    BOTTLES DRAWN AEROBIC ONLY Blood Culture results may not be optimal due to an inadequate volume of blood received in culture bottles   Culture   Final    NO GROWTH 5 DAYS Performed at [facility], [place] [address], [place], [place] [address]    Report Status 05/11/2019 FINAL  Final  C difficile quick scan w PCR reflex     Status: None   Collection Time: 05/12/19 10:26 AM   Specimen: STOOL  Result Value Ref Range Status   C Diff antigen NEGATIVE NEGATIVE Final   C Diff toxin NEGATIVE NEGATIVE Final   C Diff interpretation No C. difficile detected.  Final    Comment: Performed at [facility], [place] [address] [place], [place] [address]  Culture, blood  (routine x 2)     Status: None   Collection Time: 05/12/19  9:07 PM   Specimen: BLOOD LEFT HAND  Result Value Ref Range Status   Specimen Description BLOOD LEFT HAND  Final   Special Requests   Final    Blood Culture results may not be optimal due to an inadequate volume of blood received in culture bottles   Culture   Final    NO GROWTH 5 DAYS Performed at [facility], [place] [address], [place], [place] [address]    Report Status 05/17/2019 FINAL  Final  Culture, blood (routine x 2)     Status: None   Collection Time: 05/12/19  9:21 PM   Specimen: BLOOD RIGHT HAND  Result Value Ref Range Status   Specimen Description BLOOD RIGHT HAND  Final   Special Requests   Final    BOTTLES DRAWN AEROBIC ONLY Blood Culture results may not be optimal due to an inadequate volume of blood received in culture bottles  Culture   Final    NO GROWTH 5 DAYS Performed at [facility], [place] [address], [place], [place] [address]    Report Status 05/17/2019 FINAL  Final  Culture, respiratory (non-expectorated)     Status: None   Collection Time: 05/20/19  8:06 PM   Specimen: Tracheal Aspirate; Respiratory  Result Value Ref Range Status   Specimen Description TRACHEAL ASPIRATE  Final   Special Requests NONE  Final   Gram Stain   Final    ABUNDANT WBC PRESENT, PREDOMINANTLY PMN NO ORGANISMS SEEN    Culture   Final    NO GROWTH 2 DAYS Performed at [facility], [address] [place], [place] [address]    Report Status 05/23/2019 FINAL  Final  MRSA PCR Screening     Status: Abnormal   Collection Time: 05/25/19 11:06 AM   Specimen: Nasal Mucosa; Nasopharyngeal  Result Value Ref Range Status   MRSA by PCR POSITIVE (A) NEGATIVE Final    Comment:        The GeneXpert MRSA Assay (FDA approved for NASAL specimens only), is one component of a comprehensive MRSA colonization surveillance program. It is not intended to diagnose MRSA infection nor to guide or monitor treatment  for MRSA infections. RESULT CALLED TO, READ BACK BY AND VERIFIED WITH: Silver Huguenin RN 13:15 05/25/19 ([email or web address]) Performed at [facility], [place] [address] [place], [place] [address]   Culture, blood (routine x 2)     Status: Abnormal   Collection Time: 05/26/19 10:45 AM   Specimen: BLOOD RIGHT HAND  Result Value Ref Range Status   Specimen Description BLOOD RIGHT HAND  Final   Special Requests AEROBIC BOTTLE ONLY Blood Culture adequate volume  Final   Culture  Setup Time   Final    GRAM POSITIVE COCCI AEROBIC BOTTLE ONLY CRITICAL RESULT CALLED TO, READ BACK BY AND VERIFIED WITH: PHARMD J STEENWYK [id number] MLM    Culture (A)  Final    STAPHYLOCOCCUS SPECIES (COAGULASE NEGATIVE) THE SIGNIFICANCE OF ISOLATING THIS ORGANISM FROM A SINGLE SET OF BLOOD CULTURES WHEN MULTIPLE SETS ARE DRAWN IS UNCERTAIN. PLEASE NOTIFY THE MICROBIOLOGY DEPARTMENT WITHIN ONE WEEK IF SPECIATION AND SENSITIVITIES ARE REQUIRED. Performed at [facility], [place] [address] [place], [place] [address]    Report Status 05/28/2019 FINAL  Final  Culture, blood (routine x 2)     Status: None   Collection Time: 05/26/19 10:50 AM   Specimen: BLOOD LEFT HAND  Result Value Ref Range Status   Specimen Description BLOOD LEFT HAND  Final   Special Requests   Final    AEROBIC BOTTLE ONLY Blood Culture results may not be optimal due to an inadequate volume of blood received in culture bottles   Culture   Final    NO GROWTH 5 DAYS Performed at [facility], [place] [address], [place], [place] [address]    Report Status 05/31/2019 FINAL  Final  Culture, respiratory     Status: None   Collection Time: 05/26/19 11:52 AM   Specimen: Tracheal Aspirate; Respiratory  Result Value Ref Range Status   Specimen Description TRACHEAL ASPIRATE  Final   Special Requests Normal  Final   Gram Stain   Final    MODERATE WBC PRESENT, PREDOMINANTLY PMN NO ORGANISMS SEEN Performed at [facility], [address],  [place],  [address]    Culture FEW CANDIDA ALBICANS  Final   Report Status 05/29/2019  FINAL  Final  Culture, blood (routine x 2)     Status: None   Collection Time: 05/27/19  4:48 PM   Specimen: BLOOD  Result Value Ref Range Status   Specimen Description BLOOD RIGHT ANTECUBITAL  Final   Special Requests AEROBIC BOTTLE ONLY Blood Culture adequate volume  Final   Culture   Final    NO GROWTH 5 DAYS Performed at [facility], [address], [place], [place] [address]    Report Status 06/01/2019 FINAL  Final  Culture, blood (routine x 2)     Status: None   Collection Time: 05/27/19  4:53 PM   Specimen: BLOOD RIGHT HAND  Result Value Ref Range Status   Specimen Description BLOOD RIGHT HAND  Final   Special Requests   Final    AEROBIC BOTTLE ONLY Blood Culture results may not be optimal due to an inadequate volume of blood received in culture bottles   Culture   Final    NO GROWTH 5 DAYS Performed at [facility], [place] [address] [place], [place] [address]    Report Status 06/01/2019 FINAL  Final  Culture, blood (routine x 2)     Status: None   Collection Time: 06/01/19  3:50 AM   Specimen: BLOOD  Result Value Ref Range Status   Specimen Description BLOOD A-LINE  Final   Special Requests   Final    BOTTLES DRAWN AEROBIC AND ANAEROBIC Blood Culture adequate volume   Culture   Final    NO GROWTH 5 DAYS Performed at [facility], [place] [address], [place], [place] [address]    Report Status 06/06/2019 FINAL  Final  Culture, blood (Routine X 2) w Reflex to ID Panel     Status: None   Collection Time: 06/01/19  4:51 AM   Specimen: BLOOD RIGHT HAND  Result Value Ref Range Status   Specimen Description BLOOD RIGHT HAND  Final   Special Requests   Final    BOTTLES DRAWN AEROBIC ONLY Blood Culture adequate volume   Culture   Final    NO GROWTH 5 DAYS Performed at [facility], [place] [address], [place], [place] [address]    Report Status 06/06/2019 FINAL   Final  Culture, respiratory (non-expectorated)     Status: None   Collection Time: 06/08/19  8:08 AM   Specimen: Tracheal Aspirate; Respiratory  Result Value Ref Range Status   Specimen Description TRACHEAL ASPIRATE  Final   Special Requests Normal  Final   Gram Stain NO WBC SEEN NO ORGANISMS SEEN   Final   Culture   Final    FEW Consistent with normal respiratory flora. Performed at [facility], [place] [address], [place],  [address]    Report Status 06/10/2019 FINAL  Final    Coagulation Studies: Recent Labs    06/11/19 1152 06/12/19 0456  LABPROT 23.1* 26.3*  INR 2.1* 2.4*    Urinalysis: No results for input(s): COLORURINE, LABSPEC, PHURINE, GLUCOSEU, HGBUR, BILIRUBINUR, KETONESUR, PROTEINUR, UROBILINOGEN, NITRITE, LEUKOCYTESUR in the last 72 hours.  Invalid input(s): APPERANCEUR    Imaging: DG Abd 1 View  Result Date: 06/11/2019 CLINICAL DATA:  Enteric catheter placement EXAM: ABDOMEN - 1 VIEW COMPARISON:  06/02/2019 FINDINGS: Supine frontal view of the lower chest and upper abdomen was performed. No enteric catheter is identified. Bowel gas pattern is unremarkable. Lung bases are clear. IMPRESSION: 1. There is no enteric catheter identified on this exam. Chest x-ray  may be useful to assess enteric catheter position. Electronically Signed   By: Randa Ngo M.D.   On: 06/11/2019 20:47   DG CHEST PORT 1 VIEW  Result Date: 06/11/2019 CLINICAL DATA:  Feeding tube placement EXAM: PORTABLE CHEST 1 VIEW COMPARISON:  June 08, 2019 FINDINGS: The tracheostomy tube terminates above the carina. The tunneled dialysis catheter is stable in positioning. There is an enteric feeding tube, the tip of which projects over the upper mediastinum. The heart size is enlarged. Diffuse hazy bilateral airspace opacities are noted. There is no pneumothorax. There are probable pleural effusions. IMPRESSION: 1. Feeding tube projects over the upper mediastinum. Repositioning is  recommended. 2. Remaining lines and tubes as above. 3. Cardiomegaly with persistent diffuse hazy bilateral airspace opacities as before. These results will be called to the ordering clinician or representative by the Radiologist Assistant, and communication documented in the PACS or [employer]. Electronically Signed   By: Constance Holster M.D.   On: 06/11/2019 23:42     Medications:   . sodium chloride Stopped (06/07/19 0938)  . sodium chloride Stopped (05/21/19 0940)  . bivalirudin (ANGIOMAX) infusion 0.5 mg/mL (Non-ACS indications) 0.085 mg/kg/hr (06/13/19 0247)  . feeding supplement (VITAL 1.5 CAL) 60 mL/hr at 06/11/19 1600   . amiodarone  200 mg Per Tube BID  . ARIPiprazole  10 mg Per Tube Daily  . aspirin  81 mg Per Tube Daily  . atorvastatin  40 mg Per Tube q1800  . atropine  0.5 mg Intravenous Once  . chlorhexidine gluconate (MEDLINE KIT)  15 mL Mouth Rinse BID  . Chlorhexidine Gluconate Cloth  6 each Topical Daily  . darbepoetin (ARANESP) injection - NON-DIALYSIS  150 mcg Subcutaneous Q Mon-1800  . feeding supplement (PRO-STAT SUGAR FREE 64)  60 mL Per Tube BID  . FLUoxetine  20 mg Per Tube Daily  . free water  100 mL Per Tube Q4H  . insulin aspart  0-20 Units Subcutaneous Q4H  . insulin aspart  7 Units Subcutaneous Q4H  . insulin detemir  40 Units Subcutaneous BID  . mouth rinse  15 mL Mouth Rinse 10 times per day  . midodrine  5 mg Per Tube Q8H  . pantoprazole sodium  40 mg Per Tube Q1200  . sevelamer carbonate  2.4 g Per Tube TID WC  . sodium chloride flush  10-40 mL Intracatheter Q12H   sodium chloride, ALPRAZolam, bisacodyl, docusate, lip balm, loperamide HCl, LORazepam, morphine injection, ondansetron (ZOFRAN) IV, sodium chloride flush, zolpidem  Assessment/ Plan:  1. Acute kidney Injury: Multifactorial with dense ATN following ischemic and nephrotoxic injury followed by cardiogenic shock.  Was started on CRRT on 04/23/2019 and recently transitioned to  intermittent hemodialysis since 05/25/2019.  He is anuric and without any evidence of renal recovery with hemodialysis on TTS schedule.  Overall prognosis is poor with sequelae of CVA and continued dialysis dependency.  We will plan next dialysis treatment 06/14/2019 2.  Acute CVA: Likely embolic with bilateral infarcts and LVEF thrombus for which he was treated with TPA on admission.  Significant residual neurological deficit with plan for LTAC.  Appears to have residual encephalopathy 3.  Hypoxic respiratory failure: Prolonged ventilator wean status post tracheostomy and now tolerating trach collar. 4.  Hypertension: On midodrine at this time with fair blood pressure control allowing HD. 5.  Anemia: Status post intravenous iron and now on ESA.  No overt loss.  Continues on darbepoetin 150 mcg administered 06/13/2019     LOS: 52 Hassell Done  Smith Robert @TODAY @6 :34 AM

## 2019-06-13 NOTE — Progress Notes (Signed)
  Speech Language Pathology Treatment: Nada Boozer Speaking valve;Cognitive-Linquistic  Patient Details Name: Carlos Brewer. MRN: [id number] DOB: 26-Feb-1986 Today's Date: 06/13/2019 Time: 3500-9381 SLP Time Calculation (min) (ACUTE ONLY): 17 min  Assessment / Plan / Recommendation Clinical Impression  Pt was seen for treatment and was cooperative throughout the session. Vital were HR 104, SpO2 96, and RR 22 at baseline and pt's cuff was deflated upon SLP's entry. He tolerated PMSV for a total of 12 minutes with two brief episodes of desaturation and elevation in respiratory rate which neccessited PMSV remmoval. However, vitals range was otherwise HR 105-107, SpO2 94-99, and RR 20-37. Pt was able to sustain /a/ on three occasions but verbalization was not demonstrated despite use of various language strategies and prompts. SLP will continue to follow pt.    HPI HPI: 34 yo male smoker found to have slurred speech and Rt sided weakness.  CT head showed M2 occlusion.  Ultimately treated with thrombolytic and thrombectomy by IR.  Required intubation for airway protection. Course complicated by septic shock, AKI requiring CRRT and polymorphic VT. Intubated 2/5-2/18.      SLP Plan  Continue with current plan of care       Recommendations  Diet recommendations: NPO Medication Administration: Via alternative means      Patient may use Passy-Muir Speech Valve: Intermittently with supervision PMSV Supervision: Full MD: Please consider changing trach tube to : Smaller size;Cuffless         Oral Care Recommendations: Oral care QID Follow up Recommendations: LTACH SLP Visit Diagnosis: Aphonia (R49.1);Aphasia (R47.01) Plan: Continue with current plan of care       Josafat Enrico I. Hardin Negus, [place], [place] Office number [phone] Pager [place] 06/13/2019, 5:38 PM

## 2019-06-13 NOTE — Progress Notes (Signed)
Pt in distress at bedside, concerns for proper trache placement. Bronchoscopy performed and trach changed to #6 cuffless. Will continue to monitor.

## 2019-06-13 NOTE — Plan of Care (Signed)
  Problem: Education: Goal: Knowledge of General Education information will improve Description: Including pain rating scale, medication(s)/side effects and non-pharmacologic comfort measures Outcome: Progressing   Problem: Health Behavior/Discharge Planning: Goal: Ability to manage health-related needs will improve Outcome: Progressing   Problem: Clinical Measurements: Goal: Ability to maintain clinical measurements within normal limits will improve Outcome: Progressing Goal: Will remain free from infection Outcome: Progressing Goal: Diagnostic test results will improve Outcome: Progressing Goal: Respiratory complications will improve Outcome: Progressing Goal: Cardiovascular complication will be avoided Outcome: Progressing   Problem: Coping: Goal: Level of anxiety will decrease Outcome: Progressing   Problem: Elimination: Goal: Will not experience complications related to bowel motility Outcome: Progressing Goal: Will not experience complications related to urinary retention Outcome: Progressing   Problem: Pain Managment: Goal: General experience of comfort will improve Outcome: Progressing   Problem: Safety: Goal: Ability to remain free from injury will improve Outcome: Progressing   Problem: Skin Integrity: Goal: Risk for impaired skin integrity will decrease Outcome: Progressing   Problem: Education: Goal: Knowledge of disease or condition will improve Outcome: Progressing Goal: Knowledge of secondary prevention will improve Outcome: Progressing Goal: Knowledge of patient specific risk factors addressed and post discharge goals established will improve Outcome: Progressing Goal: Individualized Educational Video(s) Outcome: Progressing   Problem: Coping: Goal: Will verbalize positive feelings about self Outcome: Progressing Goal: Will identify appropriate support needs Outcome: Progressing   Problem: Health Behavior/Discharge Planning: Goal: Ability to  manage health-related needs will improve Outcome: Progressing   Problem: Self-Care: Goal: Ability to participate in self-care as condition permits will improve Outcome: Progressing Goal: Verbalization of feelings and concerns over difficulty with self-care will improve Outcome: Progressing Goal: Ability to communicate needs accurately will improve Outcome: Progressing   Problem: Ischemic Stroke/TIA Tissue Perfusion: Goal: Complications of ischemic stroke/TIA will be minimized Outcome: Progressing

## 2019-06-13 NOTE — Progress Notes (Signed)
Physical Therapy Treatment Patient Details Name: Carlos Brewer. MRN: [id number] DOB: 04/02/1985 Today's Date: 06/13/2019    History of Present Illness Pt is a 33 y/o male smoker who initially presented on 2/5 with slurred speech and Rt sided weakness. Admitted with left MCA CVA and multiple smaller embolic infarcts s/p tPA and thrombectomy in IR.  Additionally found to have left ventricular  thrombus. Hospital course complicated by septic shock r/t HCAP, AKI requiring CRRT, polymorphic VT and cardiogenic shock. Echo EF < 20%. Pt with Intermittent pressor dependence, required tracheostomy. Tolerating trach collar 3/4, back on vent 3/5 with flash pulmonary edema.  Started IHD 3/10.    PT Comments    Pt received in bed. Appears lethargic. Continuous cues to keep his eyes open during session. He required +2 total assist bed mobility. He sat EOB x 8 minutes total assist to maintain balance. Pt pushing posteriorly and left. Following one step commands 50% of trials. Unable to attempt standing in stedy due to lack of active participation from pt. Pt returned to supine at end of session. Pt on 10L O2 via trach collar.    Follow Up Recommendations  LTACH     Equipment Recommendations  Other (comment)(TBD)    Recommendations for Other Services       Precautions / Restrictions Precautions Precautions: Fall;Other (comment) Precaution Comments: trach collar/flexiseal    Mobility  Bed Mobility Overal bed mobility: Needs Assistance Bed Mobility: Rolling;Sidelying to Sit;Sit to Sidelying Rolling: Total assist Sidelying to sit: Total assist;+2 for physical assistance     Sit to sidelying: Total assist;+2 for physical assistance General bed mobility comments: Pt with little to no active participation in transition to EOB. Use of bed pad to assist with elevating trunk and scoot to EOB.  Transfers                    Ambulation/Gait                 Stairs              Wheelchair Mobility    Modified Rankin (Stroke Patients Only) Modified Rankin (Stroke Patients Only) Pre-Morbid Rankin Score: No symptoms Modified Rankin: Severe disability     Balance Overall balance assessment: Needs assistance Sitting-balance support: Bilateral upper extremity supported;Feet supported Sitting balance-Leahy Scale: Zero Sitting balance - Comments: Pt sat EOB x 8 minutes. Total assist to maintain balance. Pt pushing posteriorly and to the left. Postural control: Left lateral lean;Posterior lean                                  Cognition Arousal/Alertness: Lethargic Behavior During Therapy: Flat affect Overall Cognitive Status: Impaired/Different from baseline Area of Impairment: Attention;Following commands;Safety/judgement;Problem solving;Awareness                   Current Attention Level: Focused   Following Commands: Follows one step commands inconsistently;Follows one step commands with increased time Safety/Judgement: Decreased awareness of safety;Decreased awareness of deficits Awareness: Intellectual Problem Solving: Slow processing;Decreased initiation;Requires verbal cues General Comments: Poor engagement with minimal eye contact during session.      Exercises      General Comments General comments (skin integrity, edema, etc.): Continuous cues to slow breathing with multi episodes of RR in 40s. SpO2 remained in 90s with 10L O2 via trach collar.      Pertinent Vitals/Pain Pain Assessment: Faces Faces Pain Scale:  Hurts a little bit Pain Location: generalized Pain Descriptors / Indicators: Discomfort;Restless Pain Intervention(s): Monitored during session;Repositioned    Home Living                      Prior Function            PT Goals (current goals can now be found in the care plan section) Acute Rehab PT Goals Patient Stated Goal: to d/c home with mother after rehab Progress towards PT goals:  Progressing toward goals    Frequency    Min 3X/week      PT Plan Current plan remains appropriate    Co-evaluation              AM-PAC PT "6 Clicks" Mobility   Outcome Measure  Help needed turning from your back to your side while in a flat bed without using bedrails?: Total Help needed moving from lying on your back to sitting on the side of a flat bed without using bedrails?: Total Help needed moving to and from a bed to a chair (including a wheelchair)?: Total Help needed standing up from a chair using your arms (e.g., wheelchair or bedside chair)?: Total Help needed to walk in hospital room?: Total Help needed climbing 3-5 steps with a railing? : Total 6 Click Score: 6    End of Session Equipment Utilized During Treatment: Oxygen Activity Tolerance: Patient limited by lethargy;Patient limited by fatigue Patient left: in bed;with call bell/phone within reach;with bed alarm set;with restraints reapplied Nurse Communication: Mobility status PT Visit Diagnosis: Other abnormalities of gait and mobility (R26.89);Other symptoms and signs involving the nervous system (R29.898);Muscle weakness (generalized) (M62.81)     Time: 9622-2979 PT Time Calculation (min) (ACUTE ONLY): 25 min  Charges:  $Therapeutic Activity: 23-37 mins                     Lorrin Goodell, PT  Office # [phone] Pager [phone]    Lorriane Shire 06/13/2019, 1:15 PM

## 2019-06-13 NOTE — Progress Notes (Signed)
eLink Physician-Brief Progress Note Patient Name: Carlos Brewer. DOB: [birth year] MRN: [id number]   Date of Service  06/13/2019  HPI/Events of Note  ABG on 40% trach collar = 7.53/25.2/68.8/21.3. Sat = 100% by oximetry and RR = 22.   eICU Interventions  Continue current management.      Intervention Category Major Interventions: Other:  [name] 06/13/2019, 9:09 PM

## 2019-06-13 NOTE — Progress Notes (Signed)
NAME:  Carlos Brewer., MRN:  [id number], DOB:  November 05, 1985, LOS: 37 ADMISSION DATE:  04/20/2019, CONSULTATION DATE:  05/09/2019 REFERRING MD:  Dr. Leonel Ramsay, CHIEF COMPLAINT:  Slurred speech  Brief History   33 yo male smoker found to have slurred speech and Rt sided weakness.  Admitted with left MCA CVA and multiple smaller embolic infarcts s/p tPA and thrombectomy in IR.  Additionally found to have left ventricular  thrombus.    Course complicated by septic shock r/t HCAP, AKI requiring CRRT, polymorphic VT and cardiogenic shock.  Intermittent pressor dependence, required tracheostomy  Past Medical History  Systolic CHF with non ischemic CM, Cocaine abuse, OSA, DM Hx of CHF (EF 15%)  Hx myocarditis Smoker 1/2 ppd   Significant Hospital Events   2/05 Admit, tPA, IR thrombectomy 2/6 100% on fvent at 2300  Overnight requiring increasing levophed and phenylephrine.    2/6: switched to levophed, epi, started antibiotics, started on CRRT.  2/9  developed wide-complex tachycardia and hypotension, started on amiodarone drip, increase Levophed drip 2/15 drop in platelets, heparin stopped, bival started 2/17 Hypotensive and back on Levophed 2/18 trach, lines changed 2/19 started milrinone , fever 103 2/20 Episode of wide-complex tachycardia yesterday, changed from Levophed to vasopressin 2/22 stopping vanc. Still on inotrope support. Some AF w/ RVR. Had to be placed back on pressors. ivabradine added 2/23 still on pressors/ CRRT continued 2/24 tmax 98.4, Remains on CRRT, even UF, remains anuric, Levophed at 14 mcg/min, Milrinone at 0.125 mcg/kg/min, Coox 63.4 Doing well this morning on ATC, no events overnight.  Midodrine added   2/25 more interactive, remains on ATC, tmax 99.5/ WBC 21.4, ~900 ml liquid stool/ 24 hours, neg Cdiff, Levophed up to 20 mcg/min, CVP 2, ,milrinone remains at 0.125 mcg/kg/min, coox 92.7, Off CRRT since last night ~2000 s/p clotted x 3 off citrate,  restarted  3/1 Amio drip restarted for WCT/atrial flutter, milrinone turned off 3/2 CRRT stopped, permacath placed 3/5 crrt resumed 3/6 started on pressors for crrt tolerance 3/8 No longer on CRRT or pressor support  3/12 Tolerated PS for >2 hours  3/17  febrile 106 overnight, shock on pressors, new lines placed, broad-spectrum antibiotics 3/22: Awake, appears comfortable on pressure support ventilation.  Currently weaning Precedex, he is back on low-dose norepinephrine 3/23 pm back on vent at hs for resp distress / desats ? chf on cxr  3/24 t collar 24/7 3/29 tolerating ATM without difficulty   Consults:  Neuro IR Cardiology  Nephrology Heart Failure  EP   Procedures:  2/5-2/6:  TPA given at 428 am (total of 90 Mg) 520 am went to IR  S/P Lt common carotid arteriogram followed by complete revascularization of occluded LT MCA sup division mid M2 seg with x 1 pass with 40mx 40 mm solitaire X ret river device and penumbra aspiration with TICI 3 revascularization  ETT 2/05 >> 2/18 LIJ CVL 2/5 >>2/18  RT [place] CVL 2/18 >>3/2 RIJ HD cath 2/6 >>2/18 6 shiley cuffed trach 2/18  >>3/4  RT fem HD 2/19 >>3/2 RIJ permacath 3/2>> 3/4 shiley 4 uncuffed.... dislodged placed back 6cuffed shiley that evening 3/5: change to #6 cuffed distal XLT  3/17 right femoral CVL >> 3/23 3/17 right femoral arterial line >>3/23  Significant Diagnostic Tests:  CT angio head/neck 2/05 >> occlusion of Lt MCA bifurcation Echo 2/05 >> EF less than 20%, cannot rule out apical thrombus MRI brain 2/11 > extensive acute infarction of multiple areas without large or  medium vessel occlusion Echocardiogram 2/8 Left ventricular ejection fraction, by estimation, is <20%. The left  ventricle has severely decreased function. The left ventricle demonstrates  global hypokinesis. The left ventricular internal cavity size was severely  dilated. Possible small 0.8 x 0.6 cm apical thrombus.   3/16 duplex upper and lower  extremities >>neg  3/17 head CT >.  New area of hypoattenuation in the left pons , left frontal encephalomalacia related to old infarct  Micro Data:  SARS CoV2 PCR 2/05 >> negative Influenza PCR 2/05 >> negative ... Cdiff 2/25 >> neg Trach asp 3/11>> Few candida albicans BC 3/11>> 1 of 4 with Coag neg staph  BC 3/12 x 2 > neg  BC 3/17 x 2 > neg   trach 3/24 >>> no  wbc, no organisms final - nl flora   Antimicrobials:  mero 2/6 -2/12 Zosyn 2/6 , 2/19 >> 2/23 vanc 2/6 >> 2/9 , 2/19 >>2/22 Ceftriaxone 3/12> 3/16 3/17 vanc >> 3/19 3/17 Anidulafungin >> 3/19 3/17 cefepime >>  3/22   Interim history/subjective:  Tolerating ATM without difficulty  Objective   Blood pressure 115/85, pulse (!) 107, temperature 98.7 F (37.1 C), temperature source Oral, resp. rate (!) 25, height 6' 1" (1.854 m), weight 106.5 kg, SpO2 98 %.    FiO2 (%):  [35 %-40 %] 40 %   Intake/Output Summary (Last 24 hours) at 06/13/2019 1427 Last data filed at 06/12/2019 1700 Gross per 24 hour  Intake 77.41 ml  Output -  Net 77.41 ml   Filed Weights   06/09/19 0755 06/09/19 1132 06/11/19 1153  Weight: 108.6 kg 105 kg 106.5 kg     Physical Exam:   Tmax 99 (no change) Pt alert, flat affect, attempts to interact No jvd  Lungs: Rhonchi S1-S2 appreciated Abd obese with limited  excursion but no paradox or grunting as before  Extr warm with no edema or clubbing noted Alert, attempts to phonate      Resolved Hospital Problem list   HCAP 9/39, Acute metabolic encephalopathy 2nd to hypoxia and renal failure, Cardiogenic shock HIT   Assessment & Plan:   Circulatory shock Underlying cardiomyopathy Plan Midodrine    Chronic systolic CHF, non-ischemic Polymorphic VT. Apical thrombus in left ventricle. HLD. Plan Continuing aspirin, Lipitor, and amiodarone Telemetry 3/26  added back bivalirudin as INR subtherapeutic for  2nd day   Acute toxic metabolic encephalopathy/Uremic encephalopathy  Hyperactive delirium  Plan On Abilify, Prozac, Ambien Aggressive PT   Acute hypoxic, hypercapnic respiratory failure with compromised airway in setting of CVA.  Complicated by recurrent aspiration and failure to wean from vent s/p tracheostomy.  -Unfortunately it seems as though his cardiomyopathy/renal failure also playing a role in liberating him from ventilator Plan Tolerating trach collar well Resume PM valve as tolerated   Lt MCA CVA likely embolic. Left pontine CVA, ?new noted on 3/17 Hx of cocaine abuse, depression. Plan Continuing supportive care   AKI from ATN in setting of hypoxia, cardiogenic shock. Off CVVH since 3/7 , tolerating IHD since 3/10 Plan  per renal >> no response to diuresis  See note 3/26   Permanent access not planned due to poor overall prognosis    Fluid and electrolyte imbalance/acid-base imbalance: Hypokalemia, anion gap metabolic acidosis Plan >>>  Need to be careful with KCl supplements  if not dialyzing       Anemia of critical illness no evidence of bleeding  Plan Trend CBC Transfusion per protocol    DM type II poorly  controlled with hyperglycemia. Plan Levemir and SSI  Pumonary infiltrates/ recurrent 3/24 ? Chf/ pna/amiodarone toxicity  Unfortunately he does need amiodarone given his wide-complex tachycardia.   Sherrilyn Rist, MD [place] [facility] Pager: [phone]

## 2019-06-13 NOTE — TOC Progression Note (Addendum)
Transition of Care (TOC) - Progression Note    Patient Details  Name: Kevon Tench. MRN: [id number] Date of Birth: May 10, 1985  [facility]) CM/SW Contact  Zenon Mayo, RN Phone Number: 06/13/2019, 4:18 PM  Clinical Narrative:    NCM reached out to [facility] with [facility] to see why patient was declined, she states they do not have any HD beds.  NCM reaching out to [place] with [facility] to see if patient is a candidate for them. Awaiting call back. Lauren with [facility] states yes she has looked at this patient and was unable to take patient because of HD and trach,does not look like he will renal recovery and does not look like he will be decannulated.  Lauren said she will continue to follow along to keep an eye on patient.  NCM notified [place] CSW  Of this information.  Expected Discharge Plan: Long Term Acute Care (LTAC) Barriers to Discharge: Continued Medical Work up  Expected Discharge Plan and Services Expected Discharge Plan: [place] (LTAC)                                               Social Determinants of Health (SDOH) Interventions    Readmission Risk Interventions No flowsheet data found.

## 2019-06-13 NOTE — Significant Event (Addendum)
Rapid Response Event Note  Overview: Time Called: 1846 Arrival Time: 1850 Event Type: Respiratory Pt having intermittent tachypnea and oxygen desaturation. Lurline Idol was exchanged this afternoon.   Initial Focused Assessment: Pt lying in bed, alert. Pt intermittently tachypneic with RR up to 55 breaths and simultaneously oxygen saturation decreases to mid 70%. Pt then slows his breathing down and oxygen saturation improves back to 98-100%. Lung sounds are clear, diminished. Abdomen is soft. RT at bedside. PCCM notified.   Interventions: -Dr. Lynetta Mare paged and at bedside -[id number] trach exchanged for a 6CFS- Fentanyl and Versed given for exchange and bronchoscopy  Plan of Care (if not transferred): -Continue to monitor pt's ability to maintain oxygen saturation -Trach care  Call rapid response for additional needs.  Event Summary: Name of Physician Notified: Dr. Lynetta Mare at [place] in room and stabalized Event End Time: [place]

## 2019-06-13 NOTE — Progress Notes (Signed)
ANTICOAGULATION CONSULT NOTE  Pharmacy Consult:  Bivalirudin  Indication: stroke 2/5, LV thrombus 2/8, HIT 2/15; sepsis  Allergies  Allergen Reactions  . Heparin Other (See Comments)    Heparin induced thrombocytopenia. 2/15 HIT OD 1.692. 2/16 SRA positive-90.     Patient Measurements: Height: 6\' 1"  (185.4 cm) Weight: 234 lb 12.6 oz (106.5 kg) IBW/kg (Calculated) : 79.9   Vital Signs: Temp: 98.7 F (37.1 C) (03/29 1129) Temp Source: Oral (03/29 1129) BP: 115/85 (03/29 1129) Pulse Rate: 107 (03/29 1144)  Labs: Recent Labs    06/11/19 1152 06/11/19 1152 06/11/19 1448 06/11/19 1905 06/11/19 2311 06/12/19 0213 06/12/19 0456 06/12/19 1148 06/13/19 0841  HGB  --   --   --   --   --  10.2*  --   --  11.0*  HCT  --   --   --   --   --  33.5*  --   --  36.3*  PLT  --   --   --   --   --  366  --   --  377  APTT 52*   < > 48*   < >   < >  --  60* 87* 62*  LABPROT 23.1*  --   --   --   --   --  26.3*  --  30.4*  INR 2.1*  --   --   --   --   --  2.4*  --  2.9*  CREATININE  --   --  2.09*  --   --  2.49*  --   --  3.58*   < > = values in this interval not displayed.    Estimated Creatinine Clearance: 36.9 mL/min (A) (by C-G formula based on SCr of 3.58 mg/dL (H)).  Assessment: 34 yr old male with history of ESRD on HD presented with left MCA infarct - received TPA and revascularization with IR on 2/5. On 2/8 found to have small LV apical thrombus on ECHO. Pharmacy consulted to dose IV heparin, which was switched to bivalirudin 2/15 for HIT. 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 SRA very positive at 44. Heparin allergy has appropriately been added to chart and updated with SRA results. Transitioned off bivalrudin on 3/14. Restarted bivalirudin 3/26.   Aptt within therapeutic range this am in 60s. No bleeding issues noted. CBC within normal limits. INR up to 2.9, no warfarin given last night d/t no access. Now planning PEG so will hold  warfarin.   Goal of Therapy:  INR 2-3 APTT 50-85 seconds Monitor platelets by anticoagulation protocol: Yes   Plan:  - Continue bivalirudin infusion at 0.085 mg/kg/hr  - Will recheck aptt in am  Erin Hearing PharmD., BCPS Clinical Pharmacist 06/13/2019 12:00 PM

## 2019-06-13 NOTE — Procedures (Signed)
Cortrak  Tube Type:  Cortrak - 43 inches Tube Location:  Left nare Initial Placement:  Stomach Secured by: Bridle Technique Used to Measure Tube Placement:  Documented cm marking at nare/ corner of mouth Cortrak Secured At:  70 cm    Cortrak Tube Team Note:  Consult received to place a Cortrak feeding tube.   No x-ray is required. RN may begin using tube.   If the tube becomes dislodged please keep the tube and contact the Cortrak team at [email or web address] ([email or web address]) for replacement.  If after hours and replacement cannot be delayed, place a NG tube and confirm placement with an abdominal x-ray.    Knox Saliva MS, RD, LDN Please refer to [facility] for RD and/or RD on-call/weekend/after hours pager

## 2019-06-14 ENCOUNTER — Inpatient Hospital Stay (HOSPITAL_COMMUNITY): Payer: No Typology Code available for payment source

## 2019-06-14 DIAGNOSIS — J9601 Acute respiratory failure with hypoxia: Secondary | ICD-10-CM | POA: Diagnosis not present

## 2019-06-14 DIAGNOSIS — I63312 Cerebral infarction due to thrombosis of left middle cerebral artery: Secondary | ICD-10-CM | POA: Diagnosis not present

## 2019-06-14 DIAGNOSIS — J939 Pneumothorax, unspecified: Secondary | ICD-10-CM

## 2019-06-14 DIAGNOSIS — J982 Interstitial emphysema: Secondary | ICD-10-CM

## 2019-06-14 DIAGNOSIS — N179 Acute kidney failure, unspecified: Secondary | ICD-10-CM | POA: Diagnosis not present

## 2019-06-14 DIAGNOSIS — E1169 Type 2 diabetes mellitus with other specified complication: Secondary | ICD-10-CM | POA: Diagnosis present

## 2019-06-14 DIAGNOSIS — E785 Hyperlipidemia, unspecified: Secondary | ICD-10-CM | POA: Diagnosis present

## 2019-06-14 DIAGNOSIS — I5020 Unspecified systolic (congestive) heart failure: Secondary | ICD-10-CM | POA: Diagnosis present

## 2019-06-14 DIAGNOSIS — R0902 Hypoxemia: Secondary | ICD-10-CM | POA: Diagnosis present

## 2019-06-14 DIAGNOSIS — I639 Cerebral infarction, unspecified: Secondary | ICD-10-CM | POA: Diagnosis not present

## 2019-06-14 LAB — BASIC METABOLIC PANEL
Anion gap: 18 — ABNORMAL HIGH (ref 5–15)
BUN: 85 mg/dL — ABNORMAL HIGH (ref 6–20)
CO2: 19 mmol/L — ABNORMAL LOW (ref 22–32)
Calcium: 9.5 mg/dL (ref 8.9–10.3)
Chloride: 105 mmol/L (ref 98–111)
Creatinine, Ser: 4.06 mg/dL — ABNORMAL HIGH (ref 0.61–1.24)
GFR calc Af Amer: 21 mL/min — ABNORMAL LOW (ref >60)
GFR calc non Af Amer: 18 mL/min — ABNORMAL LOW (ref >60)
Glucose, Bld: 215 mg/dL — ABNORMAL HIGH (ref 70–99)
Potassium: 4.8 mmol/L (ref 3.5–5.1)
Sodium: 142 mmol/L (ref 135–145)

## 2019-06-14 LAB — CBC
HCT: 35.4 % — ABNORMAL LOW (ref 39.0–52.0)
Hemoglobin: 10.9 g/dL — ABNORMAL LOW (ref 13.0–17.0)
MCH: 31.6 pg (ref 26.0–34.0)
MCHC: 30.8 g/dL (ref 30.0–36.0)
MCV: 102.6 fL — ABNORMAL HIGH (ref 80.0–100.0)
Platelets: 336 10*3/uL (ref 150–400)
RBC: 3.45 MIL/uL — ABNORMAL LOW (ref 4.22–5.81)
RDW: 19.1 % — ABNORMAL HIGH (ref 11.5–15.5)
WBC: 11.1 10*3/uL — ABNORMAL HIGH (ref 4.0–10.5)
nRBC: 0 % (ref 0.0–0.2)

## 2019-06-14 LAB — GLUCOSE, CAPILLARY
Glucose-Capillary: 138 mg/dL — ABNORMAL HIGH (ref 70–99)
Glucose-Capillary: 148 mg/dL — ABNORMAL HIGH (ref 70–99)
Glucose-Capillary: 162 mg/dL — ABNORMAL HIGH (ref 70–99)
Glucose-Capillary: 191 mg/dL — ABNORMAL HIGH (ref 70–99)
Glucose-Capillary: 194 mg/dL — ABNORMAL HIGH (ref 70–99)
Glucose-Capillary: 237 mg/dL — ABNORMAL HIGH (ref 70–99)

## 2019-06-14 LAB — APTT
aPTT: 114 seconds — ABNORMAL HIGH (ref 24–36)
aPTT: 102 seconds — ABNORMAL HIGH (ref 24–36)
aPTT: 89 seconds — ABNORMAL HIGH (ref 24–36)
aPTT: 65 seconds — ABNORMAL HIGH (ref 24–36)

## 2019-06-14 LAB — PROTIME-INR
INR: 4.2 (ref 0.8–1.2)
Prothrombin Time: 40.7 seconds — ABNORMAL HIGH (ref 11.4–15.2)

## 2019-06-14 MED ORDER — ANTICOAGULANT SODIUM CITRATE 4% (200MG/5ML) IV SOLN
5.0000 mL | Freq: Once | Status: AC
  Administered 2019-06-14: 3.2 mL
  Filled 2019-06-14: qty 5

## 2019-06-14 NOTE — TOC Progression Note (Addendum)
Transition of Care (TOC) - Progression Note    Patient Details  Name: Carlos Brewer. MRN: [id number] Date of Birth: [birth year]  Transition [facility] CM/SW [place], [place] Phone Number: 06/14/2019, 1:41 PM  Clinical Narrative:     CSW has staffed this patient and his dc plan with supervisor Barbette Or, [place] has called [facility] with [place] to discuss if [place] will pay for patient's vent SNF, as this is his only insurance.   Barriers to SNF include patient also being HD patient in addition to 40% trach, as well as solely having [facility] insurance.   CSW has lvm with [facility] in [place] at [phone] inquiring if they take HD trach patients per supervisor's suggestion. Awaiting call back at this time.   Update: CSW spoke with [place] they returned call, they stated they do not take trach patients that need HD, however they suggested if patient could do trilogy then he could potentially have SNF options in , however if not he would be looking at SNF options out of state. RNCM following up on if this is a possibility.    Expected Discharge Plan: Long Term Acute Care (LTAC) Barriers to Discharge: Continued Medical Work up  Expected Discharge Plan and Services Expected Discharge Plan: [place] (LTAC)                                               Social Determinants of Health (SDOH) Interventions    Readmission Risk Interventions No flowsheet data found.

## 2019-06-14 NOTE — Progress Notes (Signed)
PROGRESS NOTE    Carlos Brewer.  VOH:[id number] DOB: 1986-02-05 DOA: 05/15/2019 PCP: Patient, No Pcp Per   Brief Narrative:  34 yo male with history of systolic heart failure EF 15%, myocarditis, tobacco abuse, cocaine abuse, nonischemic cardiomyopathy.  Patient presented to the ED after being found to have slurred speech and profound rt sided weakness.  Admitted with left MCA CVA and multiple smaller embolic infarcts s/p tPA and thrombectomy in IR.  Additionally found to have left ventricular  thrombus. Course complicated by septic shock 2/2 HCAP, AKI requiring CRRT/dialysis, polymorphic VT and cardiogenic shock/septic shock requiring Intermittent pressor dependence, required tracheostomy due to extubation difficulties.  Assessment & Plan:   Active Problems:   Occlusion of left middle cerebral artery   Middle cerebral artery embolism, left   Cerebrovascular accident (CVA) due to thrombosis of left middle cerebral artery (HCC)   Shock ([place])   Shock liver   Respiratory failure (HCC)   Pressure injury of skin   Tracheostomy tube present ([place])   Acute CVA (cerebrovascular accident) ([place])   Acute kidney injury ([place])   Endotracheal tube present   Complication of tracheostomy tube ([place])   Acute hypoxemic respiratory failure (HCC)   Fever   Type 2 diabetes mellitus with other specified complication (HCC)    Acute hypoxic hypercapnic respiratory failure in the setting of CVA, HCAP pneumonia with difficulty weaning from ventilator status post tracheostomy, stable - Patient able to wean off of ventilator now status post tracheostomy 06/08/19 - Continues to have issues with tachypnea - pulm following - bronch overnight 3/29 - placed #6 cuff-less trach  - Repeat CXR in am - repeat yesterday shows resolving LLL pneumonia - follow for evolving aspiration given poor control of secretion. - HCAP pneumonia resolved off antibiotics as below - high risk for aspiration but no clear evidence  for active infection. SpO2: 100 % O2 Flow Rate (L/min): 10 L/min FiO2 (%): 40 %  Acute metabolic/toxic encephalopathy in the setting of illicit substance abuse and large MCA territory infarct - Mental status continues to be difficult to assess given large area of evolving stroke - Patient able to shake head yes or no/follow simple commands but difficulty communicating - attempting to train patient/family on "blink once for yes, two for no" as well as get white board for patient to attempt to point at for communication given left hand movement improving. - Continue PT OT, disposition likely to be difficult given patient's profound weakness - Continue tube feeds per dietary given poor PO intake/swallowing difficulties - PEG tube approved by mother - ordered 06/13/19, tentatively planned for 3/30 but given elevated INR may need to delay - will defer to IR, appreciate assistance.  Shock: Multifactorial likely cardiogenic as well as septic in the setting of above as well as HCAP pneumonia, resolved - Patient no longer on pressors, resolved infectious process after completion of antibiotics - Cardiogenic shock appears to be well managed on current regimen, defer to cardiology  Left MCA likely embolic CVA with left pontine involvement - Profound bilateral weakness although Right appears to be 1 out of 5 strength today on physical exam and able to follow simple commands like move foot, squeeze hand -still profoundly weak on the right compared to left - 3 out of 5 - Continue PT, OT; disposition will likely need to be at skilled care given patient's profound weakness and likely total care needs, will follow up with family as patient progresses -candidate for LTAC at this point -  [facility] has no HD beds - [facility] contacted  Kidney failure in the setting of acute tubular necrosis secondary to above cardiogenic shock hypoxia and hypotension; anuric on intermittent dialysis per nephrology - Patient being followed  by nephrology - CRRT initiated on 04/23/2019 and recently transitioned to intermittent hemodialysis since 05/25/2019 - now on TTS schedule - Creatinine appears to be stabilizing, unclear baseline although given patient's age and lack of history of likely was previously within normal limits - UOP continues to be extremely poor  Intake/Output Summary (Last 24 hours) at 06/14/2019 1105 Last data filed at 06/14/2019 1000 Gross per 24 hour  Intake 215.8 ml  Output --  Net 215.8 ml   Lab Results  Component Value Date   CREATININE 4.06 (H) 06/14/2019   CREATININE 3.58 (H) 06/13/2019   CREATININE 2.49 (H) 06/12/2019   Diabetes Mellitus, type 2, POA -A1C 04/23/19 6.5 consistent with uncontrolled glucose and borderline DM2 -Continue levemir 40BID and sliding scale insulin - on tube feeds so moderately well controlled at this time.  HIT - Currently being managed by pharmacy - on bivalrubin; transitioning to warfarin for prolonged anticoagulation in the future (currently on hold given elevated INR and need for PEG placement)  Pressure injury of skin, multiple - Wound care and nursing to follow  DVT prophylaxis: Warfarin (currently on hold - elevated INR and need for PEG placement). Code Status: Full Family Communication: Mother by phone - lengthy discussion about likely prolonged need for therapy - dialysis ongoing, discussed issue overnight of respiratory status and trach swap.  Disposition Plan: Continue as inpatient, likely difficult disposition given patient's new total care status, PT recommending LTAC at this point, certainly reasonable given patient's respiratory and physical therapy needs. Family agreeable for LTAC placement - [facility] has no HD beds; [facility] contacted.. Patient will likely need prolonged physical therapy and treatment with hopes to continue to improve strength, speech and activity levels.  Significant Hospital Events   2/05 Admit, tPA, IR thrombectomy 2/6 100% on fvent at 2300   Overnight requiring increasing levophed and phenylephrine.    2/6: switched to levophed, epi, started antibiotics, started on CRRT.  2/9  developed wide-complex tachycardia and hypotension, started on amiodarone drip, increase Levophed drip 2/15 drop in platelets, heparin stopped, bival started 2/17 Hypotensive and back on Levophed 2/18 trach, lines changed 2/19 started milrinone , fever 103 2/20 Episode of wide-complex tachycardia yesterday, changed from Levophed to vasopressin 2/22 stopping vanc. Still on inotrope support. Some AF w/ RVR. Had to be placed back on pressors. ivabradine added 2/23 still on pressors/ CRRT continued 2/24 tmax 98.4, Remains on CRRT, even UF, remains anuric, Levophed at 14 mcg/min, Milrinone at 0.125 mcg/kg/min, Coox 63.4 Doing well this morning on ATC, no events overnight.  Midodrine added   2/25 more interactive, remains on ATC, tmax 99.5/ WBC 21.4, ~900 ml liquid stool/ 24 hours, neg Cdiff, Levophed up to 20 mcg/min, CVP 2, ,milrinone remains at 0.125 mcg/kg/min, coox 92.7, Off CRRT since last night ~2000 s/p clotted x 3 off citrate, restarted  3/1 Amio drip restarted for WCT/atrial flutter, milrinone turned off 3/2 CRRT stopped, permacath placed 3/5 crrt resumed 3/6 started on pressors for crrt tolerance 3/8 No longer on CRRT or pressor support  3/12 Tolerated PS for >2 hours  3/17  febrile 106 overnight, shock on pressors, new lines placed, broad-spectrum antibiotics 3/22: Awake, appears comfortable on pressure support ventilation.  Currently weaning Precedex, he is back on low-dose norepinephrine 3/23 pm back on vent  at hs for resp distress / desats ? chf on cxr  3/24 T-collar 24/7  3/26-3/28 - NG pulled out - family agreeable to PEG tube - continue PT/OT/SLP - likely candidate for LTAC in the next few days to week pending clinical course and placement options. 3/29-30 - Trach exchange for cuffless #6; repeat CXR to rule out ongoing aspiration  pneumonitis, unlikely overt infection; PEG placement pending, more awake alert - LTAC approval pending. Medically improving - dispo pending location approval/availability   Consults:  Neuro IR Cardiology  Nephrology Heart Failure Team EP   Procedures:  2/5-2/6:  TPA given at 428 am (total of 90 Mg) 520 am went to IR  S/P Lt common carotid arteriogram followed by complete revascularization of occluded LT MCA sup division mid M2 seg with x 1 pass with 7mx 40 mm solitaire X ret river device and penumbra aspiration with TICI3revascularization  ETT 2/05 >> 2/18 [facility]J CVL 2/5 >>2/18  RT [place] CVL 2/18 >>3/2 RIJ HD cath 2/6 >>2/18 6 shiley cuffed trach 2/18  >>3/4  RT fem HD 2/19 >>3/2 RIJ permacath 3/2>> 3/4 shiley 4 uncuffed.... dislodged placed back 6cuffed shiley that evening 3/5: change to #6 cuffed distal XLT  3/17 right femoral CVL >> 3/23 3/17 right femoral arterial line >>3/23  3/29 uncuffed #6 trach  Significant Diagnostic Tests:   CT angio head/neck 2/05 >> occlusion of Lt MCA bifurcation Echo 2/05 >> EF less than 20%, cannot rule out apical thrombus MRI brain 2/11 > Extensive acute infarction of multiple areas without large or medium vessel occlusion Echocardiogram 2/8 Left ventricular ejection fraction, by estimation, is <20%. The left  ventricle has severely decreased function. The left ventricle demonstrates  global hypokinesis. The left ventricular internal cavity size was severely dilated. Possible small 0.8 x 0.6 cm apical thrombus.   3/16 duplex upper and lower extremities >>negative  3/17 head CT > New area of hypoattenuation in the left pons, left frontal encephalomalacia related to old infarct  Micro Data:  SARS CoV2 PCR 2/05 >> negative Influenza PCR 2/05 >> negative ... Cdiff 2/25 >> neg Trach asp 3/11>> Few candida albicans BC 3/11>> 1 of 4 with Coag neg staph  BC 3/12 x 2 > neg  BC 3/17 x 2 > neg   Trach aspirate 3/24 >>> no  wbc, no  organisms final - nl flora   Antimicrobials:  Meropenem 2/6 -2/12 Zosyn 2/6 , 2/19 >> 2/23 vanc 2/6 >> 2/9 , 2/19 >>2/22 Ceftriaxone 3/12> 3/16 3/17 vanc >> 3/19 3/17 Anidulafungin >> 3/19 3/17 cefepime >>  3/22  Objective: Vitals:   06/14/19 1015 06/14/19 1037 06/14/19 1045 06/14/19 1100  BP: 119/71 113/83 122/80 117/82  Pulse: (!) 113 (!) 115 (!) 112 (!) 111  Resp: (!) 44 16 (!) 43 (!) 28  Temp:      TempSrc:      SpO2: 97%  97% 100%  Weight:      Height:        Intake/Output Summary (Last 24 hours) at 06/14/2019 1105 Last data filed at 06/14/2019 1000 Gross per 24 hour  Intake 215.8 ml  Output --  Net 215.8 ml   Filed Weights   06/09/19 1132 06/11/19 1153 06/14/19 0825  Weight: 105 kg 106.5 kg 104.7 kg    Examination:   General: Pleasantly resting in bed, No acute distress. Awake, alert, unable to assess orientation given speech issues but follows simple commands. HEENT: Normocephalic atraumatic.  Sclerae nonicteric, noninjected.  Extraocular movements intact  bilaterally. Coretrak NG in place. Neck:#6 uncuffed Trach without bleeding or drainage Lungs: Tachypneic, bibasilar ronchi without over wheeze/rales Heart: tachycardic into 100/110s.  Without murmurs, rubs, or gallops. Abdomen: Soft, nontender, nondistended.  Without guarding or rebound. Extremities: Without cyanosis, clubbing, edema, or obvious deformity. Left-sided strength 3/5, right-sided strength 1/5 Vascular: Dorsalis pedis and posterior tibial pulses palpable bilaterally. Skin: Warm and dry, no erythema, Sacral skin pressure injury. Left heel pressure injury.  Data Reviewed: I have personally reviewed following labs and imaging studies  CBC: Recent Labs  Lab 06/09/19 0750 06/10/19 1157 06/12/19 0213 06/13/19 0841 06/14/19 0549  WBC 9.5 8.9 10.2 10.3 11.1*  HGB 8.8* 9.8* 10.2* 11.0* 10.9*  HCT 29.3* 31.9* 33.5* 36.3* 35.4*  MCV 108.1* 103.6* 104.0* 104.3* 102.6*  PLT 329 323 366 377 700    Basic Metabolic Panel: Recent Labs  Lab 06/07/19 1430 06/08/19 0500 06/09/19 0750 06/09/19 0750 06/10/19 1157 06/11/19 1448 06/12/19 0213 06/13/19 0841 06/14/19 0549  NA  --    < > 137   < > 135 135 136 139 142  K  --    < > 3.2*   < > 3.8 4.9 4.1 4.6 4.8  CL  --    < > 100   < > 97* 98 96* 100 105  CO2  --    < > 23   < > 22 23 22  20* 19*  GLUCOSE  --    < > 183*   < > 188* 133* 83 136* 215*  BUN  --    < > 70*   < > 59* 23* 35* 56* 85*  CREATININE  --    < > 3.39*   < > 3.42* 2.09* 2.49* 3.58* 4.06*  CALCIUM  --    < > 8.6*   < > 9.4 8.8* 9.6 10.0 9.5  PHOS 8.3*  --  4.7*  --   --   --   --   --   --    < > = values in this interval not displayed.   GFR: Estimated Creatinine Clearance: 32.3 mL/min (A) (by C-G formula based on SCr of 4.06 mg/dL (H)). Liver Function Tests: Recent Labs  Lab 06/08/19 0500 06/09/19 0750 06/10/19 1157  AST 91*  --  35  ALT 27  --  29  ALKPHOS 156*  --  162*  BILITOT 2.1*  --  1.1  PROT 6.2*  --  7.0  ALBUMIN 2.6* 2.5* 2.7*   No results for input(s): LIPASE, AMYLASE in the last 168 hours. No results for input(s): AMMONIA in the last 168 hours. Coagulation Profile: Recent Labs  Lab 06/10/19 0618 06/11/19 1152 06/12/19 0456 06/13/19 0841 06/14/19 0549  INR 1.6* 2.1* 2.4* 2.9* 4.2*   Cardiac Enzymes: No results for input(s): CKTOTAL, CKMB, CKMBINDEX, TROPONINI in the last 168 hours. BNP (last 3 results) No results for input(s): PROBNP in the last 8760 hours. HbA1C: No results for input(s): HGBA1C in the last 72 hours. CBG: Recent Labs  Lab 06/13/19 1659 06/13/19 2009 06/14/19 0012 06/14/19 0435 06/14/19 0813  GLUCAP 198* 199* 194* 237* 191*   Lipid Profile: No results for input(s): CHOL, HDL, LDLCALC, TRIG, CHOLHDL, LDLDIRECT in the last 72 hours. Thyroid Function Tests: No results for input(s): TSH, T4TOTAL, FREET4, T3FREE, THYROIDAB in the last 72 hours. Anemia Panel: No results for input(s): VITAMINB12, FOLATE,  FERRITIN, TIBC, IRON, RETICCTPCT in the last 72 hours. Sepsis Labs: No results for input(s): PROCALCITON, LATICACIDVEN in the  last 168 hours.  Recent Results (from the past 240 hour(s))  Culture, respiratory (non-expectorated)     Status: None   Collection Time: 06/08/19  8:08 AM   Specimen: Tracheal Aspirate; Respiratory  Result Value Ref Range Status   Specimen Description TRACHEAL ASPIRATE  Final   Special Requests Normal  Final   Gram Stain NO WBC SEEN NO ORGANISMS SEEN   Final   Culture   Final    FEW Consistent with normal respiratory flora. Performed at [facility], [place] [address], [place], [place] [address]    Report Status 06/10/2019 FINAL  Final    Radiology Studies: DG Chest 1 View  Result Date: 06/13/2019 CLINICAL DATA:  Tracheomegaly. Congestive heart failure. EXAM: CHEST  1 VIEW COMPARISON:  Radiographs 06/11/2019 and 06/08/2019. CT 12/13/2018. FINDINGS: 1652 hours. Right IJ dialysis catheter and tracheostomy appear unchanged. The feeding tube has been advanced, projecting below the diaphragm, tip not visualized. There is stable cardiac enlargement. Diffuse bilateral airspace opacities have not significantly changed. There is no pneumothorax or significant pleural effusion. The bones appear unchanged. IMPRESSION: Interval advancement of the feeding tube. Otherwise stable chest with diffuse bilateral airspace opacities consistent with congestive heart failure. Electronically Signed   By: Richardean Sale M.D.   On: 06/13/2019 16:59   Scheduled Meds: . amiodarone  200 mg Per Tube BID  . ARIPiprazole  10 mg Per Tube Daily  . aspirin  81 mg Per Tube Daily  . atorvastatin  40 mg Per Tube q1800  . atropine  0.5 mg Intravenous Once  . chlorhexidine  15 mL Mouth Rinse BID  . chlorhexidine gluconate (MEDLINE KIT)  15 mL Mouth Rinse BID  . Chlorhexidine Gluconate Cloth  6 each Topical Q0600  . darbepoetin (ARANESP) injection - NON-DIALYSIS  150 mcg Subcutaneous Q Mon-1800   . feeding supplement (PRO-STAT SUGAR FREE 64)  60 mL Per Tube BID  . FLUoxetine  20 mg Per Tube Daily  . free water  100 mL Per Tube Q4H  . insulin aspart  0-20 Units Subcutaneous Q4H  . insulin aspart  7 Units Subcutaneous Q4H  . insulin detemir  40 Units Subcutaneous BID  . mouth rinse  15 mL Mouth Rinse q12n4p  . midodrine  5 mg Per Tube Q8H  . pantoprazole sodium  40 mg Per Tube Q1200  . sevelamer carbonate  2.4 g Per Tube TID WC  . sodium chloride flush  10-40 mL Intracatheter Q12H   Continuous Infusions: . sodium chloride Stopped (06/07/19 0938)  . sodium chloride Stopped (05/21/19 0940)  . anticoagulant sodium citrate    . bivalirudin (ANGIOMAX) infusion 0.5 mg/mL (Non-ACS indications) 0.06 mg/kg/hr (06/14/19 1047)  . feeding supplement (VITAL 1.5 CAL) 60 mL/hr at 06/11/19 1600    LOS: 53 days   Time spent: 49mn  Rudolpho Claxton C Wilmore Holsomback, DO [facility]  If 7PM-7AM, please contact night-coverage [email or web address] 06/14/2019, 11:05 AM

## 2019-06-14 NOTE — TOC Progression Note (Signed)
Transition of Care (TOC) - Progression Note    Patient Details  Name: Carlos Brewer. MRN: [id number] Date of Birth: [birth year]  Transition of Care [facility]) CM/SW Contact  Zenon Mayo, RN Phone Number: 06/14/2019, 3:41 PM  Clinical Narrative:    NCM contacted Heather at the [facility] she states looks like he is not eligible for [facility] services, she will check further into and call Caryl Pina CSW tomorrow.   Expected Discharge Plan: Long Term Acute Care (LTAC) Barriers to Discharge: Continued Medical Work up  Expected Discharge Plan and Services Expected Discharge Plan: [place] (LTAC)                                               Social Determinants of Health (SDOH) Interventions    Readmission Risk Interventions No flowsheet data found.

## 2019-06-14 NOTE — Procedures (Addendum)
Bronchoscopy Procedure Note Carlos Brewer [id number] [birth year]  Procedure: Bronchoscopy Indications: Diagnostic evaluation of the airways  Procedure Details Consent: Unable to obtain consent because of emergent medical necessity. Time Out: Verified patient identification, verified procedure, site/side was marked, verified correct patient position, special equipment/implants available, medications/allergies/relevent history reviewed, required imaging and test results available.  Performed  In preparation for procedure, scope was lubricated. Sedation: Benzodiazepines and narcotics  Airway entered and the following bronchi were examined: trachea. Procedures performed:   Called to bedside for cyclic desaturation to the 70's since tracheostomy change to #5 Distal XLT.  Patient has cyclic breathing pattern. Minimal secretions, but some resistance to passage of suction catheter.   Suspected that tracheostomy tip abutting against wall of the trachea creating intermittent obstruction.   Tracheostomy tip was visualized in lumen without clear obstruction. Nevertheless, tracheostomy was replaced with 6 cuffless inserted over bronchoscope.  Position confirmed in lumen of trachea.   Patient continued to have cyclic desaturation for 2-3 s  Only before returning to 100%.    Carlos Brewer Carlos Brewer 06/14/2019

## 2019-06-14 NOTE — Progress Notes (Addendum)
ANTICOAGULATION CONSULT NOTE  Pharmacy Consult:  Bivalirudin  Indication: stroke 2/5, LV thrombus 2/8, HIT 2/15  Allergies  Allergen Reactions  . Heparin Other (See Comments)    Heparin induced thrombocytopenia. 2/15 HIT OD 1.692. 2/16 SRA positive-90.     Patient Measurements: Height: 6\' 1"  (185.4 cm) Weight: 223 lb 5.2 oz (101.3 kg) IBW/kg (Calculated) : 79.9   Vital Signs: Temp: 99.1 F (37.3 C) (03/30 1603) Temp Source: Axillary (03/30 1603) BP: 117/67 (03/30 2000) Pulse Rate: 125 (03/30 2000)  Labs: Recent Labs    06/11/19 2311 06/12/19 0213 06/12/19 0456 06/12/19 1148 06/13/19 0841 06/13/19 0841 06/14/19 0549 06/14/19 0549 06/14/19 0841 06/14/19 1538 06/14/19 2018  HGB   < > 10.2*  --   --  11.0*  --  10.9*  --   --   --   --   HCT  --  33.5*  --   --  36.3*  --  35.4*  --   --   --   --   PLT  --  366  --   --  377  --  336  --   --   --   --   APTT   < >  --  60*   < > 62*   < > 114*   < > 102* 89* 65*  LABPROT  --   --  26.3*  --  30.4*  --  40.7*  --   --   --   --   INR  --   --  2.4*  --  2.9*  --  4.2*  --   --   --   --   CREATININE  --  2.49*  --   --  3.58*  --  4.06*  --   --   --   --    < > = values in this interval not displayed.    Estimated Creatinine Clearance: 31.8 mL/min (A) (by C-G formula based on SCr of 4.06 mg/dL (H)).  Assessment: 34 yr old male with history of ESRD on HD presented with left MCA infarct - received TPA and revascularization with IR on 2/5. On 2/8 found to have small LV apical thrombus on ECHO. Pharmacy consulted to dose IV heparin, which was switched to bivalirudin 2/15 for HIT. 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 SRA very positive at 91. Heparin allergy has appropriately been added to chart and updated with SRA results. Transitioned off bivalrudin on 3/14. Restarted bivalirudin 3/26.   APTT now therapeutic at 65 after rate decrease. Warfarin has been on hold since 3/28 due to lack  of access. INR up to 4.2 today? - will f/u repeat level with AM labs. CBC stable. No active bleed issues reported  Goal of Therapy:  APTT 50-85 seconds Monitor platelets by anticoagulation protocol: Yes   Plan:  Continue bivalirudin infusion at 0.05 mg/kg/hr  Confirmatory aPTT in 4 hours Monitor daily aPTT/CBC, s/sx bleeding   Arturo Morton, PharmD, BCPS Please check AMION for all [place] contact numbers Clinical Pharmacist 06/14/2019 9:16 PM  1 am: Confirmatory PTT therapeutic Thank you Anette Guarneri, PharmD

## 2019-06-14 NOTE — Progress Notes (Addendum)
ANTICOAGULATION CONSULT NOTE  Pharmacy Consult:  Bivalirudin  Indication: stroke 2/5, LV thrombus 2/8, HIT 2/15; sepsis  Allergies  Allergen Reactions  . Heparin Other (See Comments)    Heparin induced thrombocytopenia. 2/15 HIT OD 1.692. 2/16 SRA positive-90.     Patient Measurements: Height: 6\' 1"  (185.4 cm) Weight: 234 lb 12.6 oz (106.5 kg) IBW/kg (Calculated) : 79.9   Vital Signs: Temp: 99.2 F (37.3 C) (03/30 0724) Temp Source: Axillary (03/30 0724) BP: 115/85 (03/30 0724) Pulse Rate: 113 (03/30 0724)  Labs: Recent Labs    06/11/19 1152 06/12/19 0213 06/12/19 0213 06/12/19 0456 06/12/19 0456 06/12/19 1148 06/13/19 0841 06/14/19 0549  HGB  --  10.2*   < >  --   --   --  11.0* 10.9*  HCT  --  33.5*  --   --   --   --  36.3* 35.4*  PLT  --  366  --   --   --   --  377 336  APTT   < >  --   --  60*   < > 87* 62* 114*  LABPROT   < >  --   --  26.3*  --   --  30.4* 40.7*  INR   < >  --   --  2.4*  --   --  2.9* 4.2*  CREATININE  --  2.49*  --   --   --   --  3.58* 4.06*   < > = values in this interval not displayed.    Estimated Creatinine Clearance: 32.5 mL/min (A) (by C-G formula based on SCr of 4.06 mg/dL (H)).  Assessment: 34 yr old male with history of ESRD on HD presented with left MCA infarct - received TPA and revascularization with IR on 2/5. On 2/8 found to have small LV apical thrombus on ECHO. Pharmacy consulted to dose IV heparin, which was switched to bivalirudin 2/15 for HIT. 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 SRA very positive at 63. Heparin allergy has appropriately been added to chart and updated with SRA results. Transitioned off bivalrudin on 3/14. Restarted bivalirudin 3/26.   APTT came back supratherapeutic - unclear how drawn - repeat value came back at 102, drawn correctly. INR came back at 4.2. Hgb 10.9, plt 336. No s/sx of bleeding. No bleeding issues noted. Warfarin has been on hold since 3/28 due to  lack of access.    Goal of Therapy:  INR 2-3 APTT 50-85 seconds Monitor platelets by anticoagulation protocol: Yes   Plan:  - Continue to hold warfarin therapy  - given off warfarin at this time, will disregard INR - Reduce bivalirudin infusion at 0.06 mg/kg/hr  - Will recheck aptt in am  Antonietta Jewel, PharmD, [place] Pharmacist  Phone: [phone]  Please check AMION for all [place] phone numbers After 10:00 PM, call [place] [phone] 06/14/2019 8:29 AM  ADDENDUM APTT is slightly supratherapeutic at 89, on 0.06 mg/kg/hr. Reduce bivalirudin to 0.05 mg/kg/hr and get level in 4 hours. No s/sx of bleeding or infusion issues.  Antonietta Jewel, PharmD, [place] Clinical Pharmacist

## 2019-06-14 NOTE — Progress Notes (Addendum)
eLink Physician-Brief Progress Note Patient Name: Carlos Brewer. DOB: [birth year] MRN: [id number]   Date of Service  06/14/2019  HPI/Events of Note  Review of CXR reveals trace biapical pneumothoraces have developed volume estimated approximately 5-10% each. Additionally, there is diffuse pneumomediastinum with subcutaneous gas dissecting into the supraclavicular regions. Patient is on trach collar O2. Ground team already aware of CXR findings.  eICU Interventions  Continue management per ground team.      Intervention Category Major Interventions: Other:  Carlos Brewer 06/14/2019, 9:11 PM

## 2019-06-14 NOTE — Progress Notes (Addendum)
PCCM Overnight   Examined patient at bedside  Pt with increased respiratory rate, cyclic increase in respiratory rate HR in 120s  O2 sat 99% on FiO2 35 % via trach collar Palpation of chest wall has no crepitus Good air entry bilaterally on exam  On review of chart and EMR pt has a h/o left pontine CVA new on 3/17 previous left frontal encephalomalacia, HFrEF <20% on last echo Pt received dialysis today per RN  Noted to have cyclic desaturation since tracheostomy was changed to 5 distal XLT--> it was replaced with cuffless 6 trach confirmed in position with direct visualization on bronchoscopy.  Subsequent CXR now shows at 1935 : very small biapical pneumothoraces and diffuse Pneumomediastinum with subcutaneous gas dissecting into the supraclavicular regions with no evidence of tension.  Plan: Increased work of breathing due to Primary central neurogenic hyperventilation  Pt has a known CVA in left pontine  ABG from 3/17-> 3/29 show developing resp alkalosis During the period where his RR increases he appears to be exerting large transpulmonary pressures Which may explain the changes on his CXR Will increase FiO2 and flow via trach collar No indication for a chest tube at this time. Rpt CXR in AM O2 sats 99% when pleth is good Asked RN to give morphine 2 mg IV  Pt may also benefit from prn ativan.  If work of breathing does not improve, resp alkalosis ph >7.6 pt may need to be connected to mechanical ventilation and sedated;  Mechanical ventilation with decreased PEEP given most recent CXR    Signed Dr Seward [facility]

## 2019-06-14 NOTE — Progress Notes (Signed)
Pt's heart rate on 120's sinus tach.  Still on kussmaul respiration which MD aware this am. Text paged and paged  Dr. Avon Gully  many times Still waiting for return call.

## 2019-06-14 NOTE — Progress Notes (Signed)
[facility] ROUNDING NOTE   Subjective:   Brief history this is a 34 year old gentleman with a history of systolic heart failure myocarditis tobacco abuse cocaine abuse nonischemic cardiomyopathy.  He had presented to the emergency room 04/30/2019 with a left MCA CVA multiple small embolic infarcts status post TPA thrombectomy in IR.  His hospital course been complicated by septic shock hospital-acquired pneumonia acute kidney injury requiring CRRT and ultimately dialysis polymorphic ventricular tachycardia and cardiogenic shock/septic shock requiring intermittent pressor dependence.  Due to difficulty with vent wean he has required tracheostomy 05/05/2019.  The plan is for placement of PEG tube and LTAC.  Appears to have some worsening dyspnea.  Is due for dialysis.  Underwent bronchoscopy 06/13/2019.  Blood pressure 115/85 pulse 112 temperature 98.5 O2 sats 100% FiO2 40% trach collar  Sodium 139 potassium 4.6 chloride 100 CO2 20 BUN 56 creatinine 3.58 glucose 136 hemoglobin 11.0  Amiodarone 200 mg twice daily, Abilify 10 mg daily aspirin 81 mg daily atorvastatin 40 mg daily darbepoetin 150 mcg q. Monday Prozac 20 mg daily, insulin 7 units every 4 hours, insulin Levemir 40 units twice daily midodrine 5 mg every 8 hours Protonix 40 mg daily Renvela 2.4 g 3 times daily Coumadin dosing per pharmacy, Angiomax  Last chest x-ray interval advancement of feeding tube otherwise stable chest with diffuse bilateral airspace opacities consistent with congestive heart failure 06/13/2019 Abdominal x-ray no enteric catheter identified  CT scan of head 06/01/2019 likely evolving region of encephalomalacia right frontal lobe corresponding with prior infarcts on MRI  Objective:  Vital signs in last 24 hours:  Temp:  [97.6 F (36.4 C)-98.7 F (37.1 C)] 98.5 F (36.9 C) (03/30 0330) Pulse Rate:  [40-119] 111 (03/30 0500) Resp:  [12-55] 23 (03/30 0500) BP: (92-130)/(56-96) 113/74 (03/30 0500) SpO2:   [84 %-100 %] 100 % (03/30 0500) FiO2 (%):  [35 %-40 %] 40 % (03/30 0428)  Weight change:  Filed Weights   06/09/19 0755 06/09/19 1132 06/11/19 1153  Weight: 108.6 kg 105 kg 106.5 kg    Intake/Output: No intake/output data recorded.   Intake/Output this shift:  No intake/output data recorded.  Gen: Appears dyspneic this morning using trach collar CVS: Pulse regular tachycardia, S1 and S2 normal.  Right IJ TDC Resp: Coarse/transmitted breath sounds bilaterally, no rales Abd: Soft, obese, nontender Ext: No lower extremity edema   Basic Metabolic Panel: Recent Labs  Lab 06/07/19 1430 06/08/19 0500 06/09/19 0750 06/09/19 0750 06/10/19 1157 06/10/19 1157 06/11/19 1448 06/11/19 1448 06/12/19 0213 06/13/19 0841 06/14/19 0549  NA  --    < > 137   < > 135  --  135  --  136 139 142  K  --    < > 3.2*   < > 3.8  --  4.9  --  4.1 4.6 4.8  CL  --    < > 100   < > 97*  --  98  --  96* 100 105  CO2  --    < > 23   < > 22  --  23  --  22 20* 19*  GLUCOSE  --    < > 183*   < > 188*  --  133*  --  83 136* 215*  BUN  --    < > 70*   < > 59*  --  23*  --  35* 56* 85*  CREATININE  --    < > 3.39*   < >  3.42*  --  2.09*  --  2.49* 3.58* 4.06*  CALCIUM  --    < > 8.6*   < > 9.4   < > 8.8*   < > 9.6 10.0 9.5  PHOS 8.3*  --  4.7*  --   --   --   --   --   --   --   --    < > = values in this interval not displayed.    Liver Function Tests: Recent Labs  Lab 06/08/19 0500 06/09/19 0750 06/10/19 1157  AST 91*  --  35  ALT 27  --  29  ALKPHOS 156*  --  162*  BILITOT 2.1*  --  1.1  PROT 6.2*  --  7.0  ALBUMIN 2.6* 2.5* 2.7*   No results for input(s): LIPASE, AMYLASE in the last 168 hours. No results for input(s): AMMONIA in the last 168 hours.  CBC: Recent Labs  Lab 06/09/19 0750 06/10/19 1157 06/12/19 0213 06/13/19 0841 06/14/19 0549  WBC 9.5 8.9 10.2 10.3 11.1*  HGB 8.8* 9.8* 10.2* 11.0* 10.9*  HCT 29.3* 31.9* 33.5* 36.3* 35.4*  MCV 108.1* 103.6* 104.0* 104.3* 102.6*   PLT 329 323 366 377 336    Cardiac Enzymes: No results for input(s): CKTOTAL, CKMB, CKMBINDEX, TROPONINI in the last 168 hours.  BNP: Invalid input(s): POCBNP  CBG: Recent Labs  Lab 06/13/19 1127 06/13/19 1659 06/13/19 2009 06/14/19 0012 06/14/19 0435  GLUCAP 130* 198* 199* 194* 237*    Microbiology: Results for orders placed or performed during the hospital encounter of 05/15/2019  Respiratory Panel by RT PCR (Flu A&B, Covid) - Nasopharyngeal Swab     Status: None   Collection Time: 04/21/2019  4:42 AM   Specimen: Nasopharyngeal Swab  Result Value Ref Range Status   SARS Coronavirus 2 by RT PCR NEGATIVE NEGATIVE Final    Comment: (NOTE) SARS-CoV-2 target nucleic acids are NOT DETECTED. The SARS-CoV-2 RNA is generally detectable in upper respiratoy specimens during the acute phase of infection. The lowest concentration of SARS-CoV-2 viral copies this assay can detect is 131 copies/mL. A negative result does not preclude SARS-Cov-2 infection and should not be used as the sole basis for treatment or other patient management decisions. A negative result may occur with  improper specimen collection/handling, submission of specimen other than nasopharyngeal swab, presence of viral mutation(s) within the areas targeted by this assay, and inadequate number of viral copies (<131 copies/mL). A negative result must be combined with clinical observations, patient history, and epidemiological information. The expected result is Negative. Fact Sheet for Patients:  PinkCheek.be Fact Sheet for Healthcare Providers:  GravelBags.it This test is not yet ap proved or cleared by the [place] FDA and  has been authorized for detection and/or diagnosis of SARS-CoV-2 by FDA under an Emergency Use Authorization (EUA). This EUA will remain  in effect (meaning this test can be used) for the duration of the COVID-19 declaration under  Section 564(b)(1) of the Act, 21 U.S.C. section 360bbb-3(b)(1), unless the authorization is terminated or revoked sooner.    Influenza A by PCR NEGATIVE NEGATIVE Final   Influenza B by PCR NEGATIVE NEGATIVE Final    Comment: (NOTE) The Xpert Xpress SARS-CoV-2/FLU/RSV assay is intended as an aid in  the diagnosis of influenza from Nasopharyngeal swab specimens and  should not be used as a sole basis for treatment. Nasal washings and  aspirates are unacceptable for Xpert Xpress SARS-CoV-2/FLU/RSV  testing. Fact Sheet for Patients:  PinkCheek.be Fact Sheet for Healthcare Providers: GravelBags.it This test is not yet approved or cleared by the [place] FDA and  has been authorized for detection and/or diagnosis of SARS-CoV-2 by  FDA under an Emergency Use Authorization (EUA). This EUA will remain  in effect (meaning this test can be used) for the duration of the  Covid-19 declaration under Section 564(b)(1) of the Act, 21  U.S.C. section 360bbb-3(b)(1), unless the authorization is  terminated or revoked. Performed at [facility], [place] [address] [place], [place] [address]   MRSA PCR Screening     Status: Abnormal   Collection Time: 04/30/2019  1:56 PM   Specimen: Nasal Mucosa; Nasopharyngeal  Result Value Ref Range Status   MRSA by PCR POSITIVE (A) NEGATIVE Final    Comment:        The GeneXpert MRSA Assay (FDA approved for NASAL specimens only), is one component of a comprehensive MRSA colonization surveillance program. It is not intended to diagnose MRSA infection nor to guide or monitor treatment for MRSA infections. RESULT CALLED TO, READ BACK BY AND VERIFIED WITH: Adrienne Mocha RN 16:20 04/29/2019 ([email or web address]) Performed at [facility], [place] [address] [place], [place] [address]   Culture, respiratory (non-expectorated)     Status: None   Collection Time: 04/23/19 10:58 AM   Specimen: Tracheal Aspirate;  Respiratory  Result Value Ref Range Status   Specimen Description TRACHEAL ASPIRATE  Final   Special Requests NONE  Final   Gram Stain   Final    FEW WBC PRESENT, PREDOMINANTLY PMN FEW GRAM NEGATIVE COCCOBACILLI FEW GRAM POSITIVE COCCI IN PAIRS IN CLUSTERS RARE GRAM POSITIVE RODS    Culture   Final    ABUNDANT Consistent with normal respiratory flora. Performed at [facility], [place] [address], [place], [place] [address]    Report Status 04/25/2019 FINAL  Final  Culture, blood (Routine X 2) w Reflex to ID Panel     Status: None   Collection Time: 04/23/19 11:20 AM   Specimen: BLOOD  Result Value Ref Range Status   Specimen Description BLOOD RIGHT ANTECUBITAL  Final   Special Requests   Final    AEROBIC BOTTLE ONLY Blood Culture results may not be optimal due to an inadequate volume of blood received in culture bottles   Culture   Final    NO GROWTH 5 DAYS Performed at [facility], [place] [address], [place], [place] [address]    Report Status 04/28/2019 FINAL  Final  Culture, blood (Routine X 2) w Reflex to ID Panel     Status: None   Collection Time: 04/23/19 11:21 AM   Specimen: BLOOD  Result Value Ref Range Status   Specimen Description BLOOD RIGHT ANTECUBITAL  Final   Special Requests   Final    AEROBIC BOTTLE ONLY Blood Culture results may not be optimal due to an inadequate volume of blood received in culture bottles   Culture   Final    NO GROWTH 5 DAYS Performed at [facility], [place] [address] [place], [place] [address]    Report Status 04/28/2019 FINAL  Final  Culture, Urine     Status: None   Collection Time: 04/23/19  1:56 PM   Specimen: Urine, Random  Result Value Ref Range Status   Specimen Description URINE, RANDOM  Final   Special Requests   Final    NONE Performed at [facility], [place],  [place] [address]    Culture NO GROWTH  Final   Report Status 04/25/2019 FINAL  Final  Culture, respiratory (non-expectorated)      Status: None   Collection Time: 05/03/19 11:23 AM   Specimen: Tracheal Aspirate; Respiratory  Result Value Ref Range Status   Specimen Description TRACHEAL ASPIRATE  Final   Special Requests NONE  Final   Gram Stain   Final    MODERATE WBC PRESENT, PREDOMINANTLY PMN RARE GRAM POSITIVE COCCI    Culture   Final    Consistent with normal respiratory flora. Performed at [facility], [place] [address], [place], [place] [address]    Report Status 05/05/2019 FINAL  Final  Culture, blood (Routine X 2) w Reflex to ID Panel     Status: None   Collection Time: 05/06/19 11:45 AM   Specimen: BLOOD LEFT ARM  Result Value Ref Range Status   Specimen Description BLOOD LEFT ARM  Final   Special Requests   Final    BOTTLES DRAWN AEROBIC ONLY Blood Culture adequate volume   Culture   Final    NO GROWTH 5 DAYS Performed at [facility], [place] [address] [place], [place] [address]    Report Status 05/11/2019 FINAL  Final  Culture, blood (Routine X 2) w Reflex to ID Panel     Status: None   Collection Time: 05/06/19 12:20 PM   Specimen: BLOOD RIGHT ARM  Result Value Ref Range Status   Specimen Description BLOOD RIGHT ARM  Final   Special Requests   Final    BOTTLES DRAWN AEROBIC ONLY Blood Culture results may not be optimal due to an inadequate volume of blood received in culture bottles   Culture   Final    NO GROWTH 5 DAYS Performed at [facility], [place] [address], [place], [place] [address]    Report Status 05/11/2019 FINAL  Final  C difficile quick scan w PCR reflex     Status: None   Collection Time: 05/12/19 10:26 AM   Specimen: STOOL  Result Value Ref Range Status   C Diff antigen NEGATIVE NEGATIVE Final   C Diff toxin NEGATIVE NEGATIVE Final   C Diff interpretation No C. difficile detected.  Final    Comment: Performed at [facility], [place] [address] [place], [place] [address]  Culture, blood (routine x 2)     Status: None   Collection Time: 05/12/19  9:07  PM   Specimen: BLOOD LEFT HAND  Result Value Ref Range Status   Specimen Description BLOOD LEFT HAND  Final   Special Requests   Final    Blood Culture results may not be optimal due to an inadequate volume of blood received in culture bottles   Culture   Final    NO GROWTH 5 DAYS Performed at [facility], [place] [address], [place], [place] [address]    Report Status 05/17/2019 FINAL  Final  Culture, blood (routine x 2)     Status: None   Collection Time: 05/12/19  9:21 PM   Specimen: BLOOD RIGHT HAND  Result Value Ref Range Status   Specimen Description BLOOD RIGHT HAND  Final   Special Requests   Final    BOTTLES DRAWN AEROBIC ONLY Blood Culture results may not be optimal due to an inadequate volume of blood received in culture bottles   Culture   Final    NO GROWTH 5 DAYS Performed at [facility], [address], [place], [place] [address]    Report Status 05/17/2019 FINAL  Final  Culture, respiratory (non-expectorated)     Status: None   Collection Time: 05/20/19  8:06 PM   Specimen: Tracheal Aspirate; Respiratory  Result Value Ref Range Status   Specimen Description TRACHEAL ASPIRATE  Final   Special Requests NONE  Final   Gram Stain   Final    ABUNDANT WBC PRESENT, PREDOMINANTLY PMN NO ORGANISMS SEEN    Culture   Final    NO GROWTH 2 DAYS Performed at [facility], [address], [place], [place] [address]    Report Status 05/23/2019 FINAL  Final  MRSA PCR Screening     Status: Abnormal   Collection Time: 05/25/19 11:06 AM   Specimen: Nasal Mucosa; Nasopharyngeal  Result Value Ref Range Status   MRSA by PCR POSITIVE (A) NEGATIVE Final    Comment:        The GeneXpert MRSA Assay (FDA approved for NASAL specimens only), is one component of a comprehensive MRSA colonization surveillance program. It is not intended to diagnose MRSA infection nor to guide or monitor treatment for MRSA infections. RESULT CALLED TO, READ BACK BY AND VERIFIED  WITH: Silver Huguenin RN 13:15 05/25/19 ([email or web address]) Performed at [facility], [place] [address], [place], [place] [address]   Culture, blood (routine x 2)     Status: Abnormal   Collection Time: 05/26/19 10:45 AM   Specimen: BLOOD RIGHT HAND  Result Value Ref Range Status   Specimen Description BLOOD RIGHT HAND  Final   Special Requests AEROBIC BOTTLE ONLY Blood Culture adequate volume  Final   Culture  Setup Time   Final    GRAM POSITIVE COCCI AEROBIC BOTTLE ONLY CRITICAL RESULT CALLED TO, READ BACK BY AND VERIFIED WITH: PHARMD J STEENWYK [id number]    Culture (A)  Final    STAPHYLOCOCCUS SPECIES (COAGULASE NEGATIVE) THE SIGNIFICANCE OF ISOLATING THIS ORGANISM FROM A SINGLE SET OF BLOOD CULTURES WHEN MULTIPLE SETS ARE DRAWN IS UNCERTAIN. PLEASE NOTIFY THE MICROBIOLOGY DEPARTMENT WITHIN ONE WEEK IF SPECIATION AND SENSITIVITIES ARE REQUIRED. Performed at [facility], [place] [address], [place], [place] [address]    Report Status 05/28/2019 FINAL  Final  Culture, blood (routine x 2)     Status: None   Collection Time: 05/26/19 10:50 AM   Specimen: BLOOD LEFT HAND  Result Value Ref Range Status   Specimen Description BLOOD LEFT HAND  Final   Special Requests   Final    AEROBIC BOTTLE ONLY Blood Culture results may not be optimal due to an inadequate volume of blood received in culture bottles   Culture   Final    NO GROWTH 5 DAYS Performed at [facility], [place] [address], [place], [place] [address]    Report Status 05/31/2019 FINAL  Final  Culture, respiratory     Status: None   Collection Time: 05/26/19 11:52 AM   Specimen: Tracheal Aspirate; Respiratory  Result Value Ref Range Status   Specimen Description TRACHEAL ASPIRATE  Final   Special Requests Normal  Final   Gram Stain   Final    MODERATE WBC PRESENT, PREDOMINANTLY PMN NO ORGANISMS SEEN Performed at [facility], [address] [place], [place] [address]    Culture FEW CANDIDA ALBICANS  Final    Report Status 05/29/2019 FINAL  Final  Culture, blood (routine x 2)     Status: None  Collection Time: 05/27/19  4:48 PM   Specimen: BLOOD  Result Value Ref Range Status   Specimen Description BLOOD RIGHT ANTECUBITAL  Final   Special Requests AEROBIC BOTTLE ONLY Blood Culture adequate volume  Final   Culture   Final    NO GROWTH 5 DAYS Performed at [place] [address] [place], [place] [address]    Report Status 06/01/2019 FINAL  Final  Culture, blood (routine x 2)     Status: None   Collection Time: 05/27/19  4:53 PM   Specimen: BLOOD RIGHT HAND  Result Value Ref Range Status   Specimen Description BLOOD RIGHT HAND  Final   Special Requests   Final    AEROBIC BOTTLE ONLY Blood Culture results may not be optimal due to an inadequate volume of blood received in culture bottles   Culture   Final    NO GROWTH 5 DAYS Performed at [facility], [place] [address], [place], [place] [address]    Report Status 06/01/2019 FINAL  Final  Culture, blood (routine x 2)     Status: None   Collection Time: 06/01/19  3:50 AM   Specimen: BLOOD  Result Value Ref Range Status   Specimen Description BLOOD A-LINE  Final   Special Requests   Final    BOTTLES DRAWN AEROBIC AND ANAEROBIC Blood Culture adequate volume   Culture   Final    NO GROWTH 5 DAYS Performed at [facility], [place] [address], [place], [place] [address]    Report Status 06/06/2019 FINAL  Final  Culture, blood (Routine X 2) w Reflex to ID Panel     Status: None   Collection Time: 06/01/19  4:51 AM   Specimen: BLOOD RIGHT HAND  Result Value Ref Range Status   Specimen Description BLOOD RIGHT HAND  Final   Special Requests   Final    BOTTLES DRAWN AEROBIC ONLY Blood Culture adequate volume   Culture   Final    NO GROWTH 5 DAYS Performed at [facility], [place] [address] [place], [place] [address]    Report Status 06/06/2019 FINAL  Final  Culture, respiratory (non-expectorated)     Status: None    Collection Time: 06/08/19  8:08 AM   Specimen: Tracheal Aspirate; Respiratory  Result Value Ref Range Status   Specimen Description TRACHEAL ASPIRATE  Final   Special Requests Normal  Final   Gram Stain NO WBC SEEN NO ORGANISMS SEEN   Final   Culture   Final    FEW Consistent with normal respiratory flora. Performed at [facility], [place] [address], [place], [place] [address]    Report Status 06/10/2019 FINAL  Final    Coagulation Studies: Recent Labs    06/11/19 1152 06/12/19 0456 06/13/19 0841 06/14/19 0549  LABPROT 23.1* 26.3* 30.4* 40.7*  INR 2.1* 2.4* 2.9* 4.2*    Urinalysis: No results for input(s): COLORURINE, LABSPEC, PHURINE, GLUCOSEU, HGBUR, BILIRUBINUR, KETONESUR, PROTEINUR, UROBILINOGEN, NITRITE, LEUKOCYTESUR in the last 72 hours.  Invalid input(s): APPERANCEUR    Imaging: DG Chest 1 View  Result Date: 06/13/2019 CLINICAL DATA:  Tracheomegaly. Congestive heart failure. EXAM: CHEST  1 VIEW COMPARISON:  Radiographs 06/11/2019 and 06/08/2019. CT 12/13/2018. FINDINGS: 1652 hours. Right IJ dialysis catheter and tracheostomy appear unchanged. The feeding tube has been advanced, projecting below the diaphragm, tip not visualized. There is stable cardiac enlargement. Diffuse bilateral airspace opacities have not significantly changed. There is no pneumothorax or significant  pleural effusion. The bones appear unchanged. IMPRESSION: Interval advancement of the feeding tube. Otherwise stable chest with diffuse bilateral airspace opacities consistent with congestive heart failure. Electronically Signed   By: Richardean Sale M.D.   On: 06/13/2019 16:59     Medications:   . sodium chloride Stopped (06/07/19 0938)  . sodium chloride Stopped (05/21/19 0940)  . bivalirudin (ANGIOMAX) infusion 0.5 mg/mL (Non-ACS indications) 0.085 mg/kg/hr (06/13/19 0247)  . feeding supplement (VITAL 1.5 CAL) 60 mL/hr at 06/11/19 1600   . amiodarone  200 mg Per Tube BID  . ARIPiprazole   10 mg Per Tube Daily  . aspirin  81 mg Per Tube Daily  . atorvastatin  40 mg Per Tube q1800  . atropine  0.5 mg Intravenous Once  . chlorhexidine  15 mL Mouth Rinse BID  . chlorhexidine gluconate (MEDLINE KIT)  15 mL Mouth Rinse BID  . Chlorhexidine Gluconate Cloth  6 each Topical Q0600  . darbepoetin (ARANESP) injection - NON-DIALYSIS  150 mcg Subcutaneous Q Mon-1800  . feeding supplement (PRO-STAT SUGAR FREE 64)  60 mL Per Tube BID  . FLUoxetine  20 mg Per Tube Daily  . free water  100 mL Per Tube Q4H  . insulin aspart  0-20 Units Subcutaneous Q4H  . insulin aspart  7 Units Subcutaneous Q4H  . insulin detemir  40 Units Subcutaneous BID  . mouth rinse  15 mL Mouth Rinse q12n4p  . midodrine  5 mg Per Tube Q8H  . pantoprazole sodium  40 mg Per Tube Q1200  . sevelamer carbonate  2.4 g Per Tube TID WC  . sodium chloride flush  10-40 mL Intracatheter Q12H   sodium chloride, ALPRAZolam, bisacodyl, docusate, lip balm, loperamide HCl, LORazepam, morphine injection, ondansetron (ZOFRAN) IV, sodium chloride flush, zolpidem  Assessment/ Plan:  1. Acute kidney Injury: Multifactorial with dense ATN following ischemic and nephrotoxic injury followed by cardiogenic shock.  Was started on CRRT on 04/23/2019 and recently transitioned to intermittent hemodialysis since 05/25/2019.  He is anuric and without any evidence of renal recovery with hemodialysis on TTS schedule.  Overall prognosis is poor with sequelae of CVA and continued dialysis dependency.  We will plan next dialysis treatment 06/14/2019.  Will need dialysis scheduled at bedside. 2.  Acute CVA: Likely embolic with bilateral infarcts and LVEF thrombus for which he was treated with TPA on admission.  Significant residual neurological deficit with plan for LTAC.  Appears to have residual encephalopathy 3.  Hypoxic respiratory failure: Prolonged ventilator wean status post tracheostomy and now tolerating trach collar. 4.  Hypertension: On midodrine at  this time with fair blood pressure control allowing HD. 5.  Anemia: Status post intravenous iron and now on ESA.  No overt loss.  Continues on darbepoetin 150 mcg administered 06/13/2019     LOS: Coin @TODAY @7 :10 AM

## 2019-06-14 NOTE — Progress Notes (Signed)
Heart rate consistent to 120's sinus tach, with Kussmaul  Respiration. Dr. Oletta Darter with critical care made aware with orders to do stat chest-xray and stat abg.

## 2019-06-14 NOTE — Progress Notes (Signed)
eLink Physician-Brief Progress Note Patient Name: Carlos Brewer. DOB: 1986/01/11 MRN: [id number]   Date of Service  06/14/2019  HPI/Events of Note  Increased WOB - RR increased into 40-60 range. Sat = 82% - 100% by oximetry.  eICU Interventions  Will order: 1. Portable CXR STAT. 2. ABG STAT. 3. Will request ground team to evaluate the patient at bedside.      Intervention Category Major Interventions: Other:  [name] 06/14/2019, 7:23 PM

## 2019-06-14 NOTE — Progress Notes (Signed)
Breathing is on and off rapid shallow breathing 30-40 and brief drop of saturation to 70's and goes up to 90's when breathing slows down . MD aware who claimed that this is not new. Continue to monitor.

## 2019-06-14 NOTE — Progress Notes (Signed)
OT Cancellation Note  Patient Details Name: Carlos Brewer. MRN: [id number] DOB: 24-Dec-1985   Cancelled Treatment:    Reason Eval/Treat Not Completed: Patient at procedure or test/ unavailable(Pt having HD in room and unable to participate with OT.)   OT to continue to follow per POC when pt is next available.  Jefferey Pica, OTR/L Acute Rehabilitation Services Pager: [phone] Office: [phone]   Jefferey Pica 06/14/2019, 12:55 PM

## 2019-06-15 ENCOUNTER — Inpatient Hospital Stay (HOSPITAL_COMMUNITY): Payer: No Typology Code available for payment source

## 2019-06-15 ENCOUNTER — Encounter (HOSPITAL_COMMUNITY): Payer: Self-pay | Admitting: Neurology

## 2019-06-15 DIAGNOSIS — J939 Pneumothorax, unspecified: Secondary | ICD-10-CM

## 2019-06-15 DIAGNOSIS — I639 Cerebral infarction, unspecified: Secondary | ICD-10-CM | POA: Diagnosis not present

## 2019-06-15 DIAGNOSIS — J9500 Unspecified tracheostomy complication: Secondary | ICD-10-CM | POA: Diagnosis not present

## 2019-06-15 DIAGNOSIS — J982 Interstitial emphysema: Secondary | ICD-10-CM

## 2019-06-15 DIAGNOSIS — R509 Fever, unspecified: Secondary | ICD-10-CM | POA: Diagnosis not present

## 2019-06-15 DIAGNOSIS — J9601 Acute respiratory failure with hypoxia: Secondary | ICD-10-CM | POA: Diagnosis not present

## 2019-06-15 DIAGNOSIS — I63312 Cerebral infarction due to thrombosis of left middle cerebral artery: Secondary | ICD-10-CM | POA: Diagnosis not present

## 2019-06-15 DIAGNOSIS — N179 Acute kidney failure, unspecified: Secondary | ICD-10-CM | POA: Diagnosis not present

## 2019-06-15 LAB — POCT I-STAT 7, (LYTES, BLD GAS, ICA,H+H)
Acid-Base Excess: 2 mmol/L (ref 0.0–2.0)
Bicarbonate: 24.9 mmol/L (ref 20.0–28.0)
Calcium, Ion: 1.16 mmol/L (ref 1.15–1.40)
HCT: 32 % — ABNORMAL LOW (ref 39.0–52.0)
Hemoglobin: 10.9 g/dL — ABNORMAL LOW (ref 13.0–17.0)
O2 Saturation: 100 %
Potassium: 5.6 mmol/L — ABNORMAL HIGH (ref 3.5–5.1)
Sodium: 139 mmol/L (ref 135–145)
TCO2: 26 mmol/L (ref 22–32)
pCO2 arterial: 33.7 mmHg (ref 32.0–48.0)
pH, Arterial: 7.476 — ABNORMAL HIGH (ref 7.350–7.450)
pO2, Arterial: 426 mmHg — ABNORMAL HIGH (ref 83.0–108.0)

## 2019-06-15 LAB — BLOOD GAS, ARTERIAL
Acid-base deficit: 0.3 mmol/L (ref 0.0–2.0)
Bicarbonate: 22.6 mmol/L (ref 20.0–28.0)
FIO2: 40
O2 Saturation: 79.6 %
Patient temperature: 38.3
pCO2 arterial: 31.3 mmHg — ABNORMAL LOW (ref 32.0–48.0)
pH, Arterial: 7.478 — ABNORMAL HIGH (ref 7.350–7.450)
pO2, Arterial: 53.5 mmHg — ABNORMAL LOW (ref 83.0–108.0)

## 2019-06-15 LAB — GLUCOSE, CAPILLARY
Glucose-Capillary: 192 mg/dL — ABNORMAL HIGH (ref 70–99)
Glucose-Capillary: 196 mg/dL — ABNORMAL HIGH (ref 70–99)
Glucose-Capillary: 208 mg/dL — ABNORMAL HIGH (ref 70–99)
Glucose-Capillary: 212 mg/dL — ABNORMAL HIGH (ref 70–99)
Glucose-Capillary: 235 mg/dL — ABNORMAL HIGH (ref 70–99)
Glucose-Capillary: 235 mg/dL — ABNORMAL HIGH (ref 70–99)
Glucose-Capillary: 240 mg/dL — ABNORMAL HIGH (ref 70–99)

## 2019-06-15 LAB — APTT
aPTT: 58 seconds — ABNORMAL HIGH (ref 24–36)
aPTT: 58 seconds — ABNORMAL HIGH (ref 24–36)

## 2019-06-15 LAB — PROTIME-INR
INR: 2.2 — ABNORMAL HIGH (ref 0.8–1.2)
Prothrombin Time: 24.6 seconds — ABNORMAL HIGH (ref 11.4–15.2)

## 2019-06-15 MED ORDER — CHLORHEXIDINE GLUCONATE CLOTH 2 % EX PADS
6.0000 | MEDICATED_PAD | Freq: Every day | CUTANEOUS | Status: DC
  Administered 2019-06-15 – 2019-06-20 (×6): 6 via TOPICAL

## 2019-06-15 MED ORDER — FENTANYL CITRATE (PF) 100 MCG/2ML IJ SOLN
25.0000 ug | INTRAMUSCULAR | Status: DC | PRN
  Administered 2019-06-18: 100 ug via INTRAVENOUS
  Administered 2019-06-18: 20:00:00 50 ug via INTRAVENOUS
  Administered 2019-06-19: 13:00:00 25 ug via INTRAVENOUS
  Administered 2019-06-19: 100 ug via INTRAVENOUS
  Administered 2019-06-24 – 2019-06-25 (×3): 50 ug via INTRAVENOUS
  Administered 2019-06-26 – 2019-07-04 (×6): 100 ug via INTRAVENOUS
  Administered 2019-07-05: 03:00:00 50 ug via INTRAVENOUS
  Filled 2019-06-15 (×14): qty 2

## 2019-06-15 MED ORDER — MIDAZOLAM HCL 2 MG/2ML IJ SOLN
2.0000 mg | INTRAMUSCULAR | Status: DC | PRN
  Administered 2019-06-15 – 2019-06-19 (×2): 2 mg via INTRAVENOUS
  Filled 2019-06-15 (×2): qty 2

## 2019-06-15 MED ORDER — VANCOMYCIN HCL IN DEXTROSE 1-5 GM/200ML-% IV SOLN: 1000.0000 mg | INTRAVENOUS | Status: DC

## 2019-06-15 MED ORDER — VANCOMYCIN HCL 2000 MG/400ML IV SOLN
2000.0000 mg | Freq: Once | INTRAVENOUS | Status: AC
  Administered 2019-06-15: 2000 mg via INTRAVENOUS
  Filled 2019-06-15: qty 400

## 2019-06-15 MED ORDER — SODIUM CHLORIDE 0.9 % IV SOLN
2.0000 g | Freq: Once | INTRAVENOUS | Status: AC
  Administered 2019-06-15: 2 g via INTRAVENOUS
  Filled 2019-06-15: qty 2

## 2019-06-15 MED ORDER — SODIUM CHLORIDE 0.9 % IV SOLN
1.0000 g | INTRAVENOUS | Status: DC
  Administered 2019-06-15: 1 g via INTRAVENOUS
  Filled 2019-06-15 (×3): qty 1

## 2019-06-15 MED ORDER — FENTANYL 2500MCG IN NS 250ML (10MCG/ML) PREMIX INFUSION
0.0000 ug/h | INTRAVENOUS | Status: DC
  Administered 2019-06-15: 25 ug/h via INTRAVENOUS
  Filled 2019-06-15: qty 250

## 2019-06-15 MED ORDER — ACETAMINOPHEN 160 MG/5ML PO SOLN
650.0000 mg | Freq: Four times a day (QID) | ORAL | Status: DC | PRN
  Administered 2019-06-15 (×3): 650 mg
  Filled 2019-06-15 (×3): qty 20.3

## 2019-06-15 NOTE — Significant Event (Addendum)
[facility] 06/15/2019 Carlos Brewer received on neurosurgical intensive care unit at approximately 0930 hrs. he was in acute respiratory distress with Cheyne-Stokes respirations and desaturating down into the 64th percentile.  MD and NP at bedside and tracheostomy was changed to a #6 distal XLT cuffed by Dr. Elsworth Soho. Stat portable chest x-ray has been ordered. He will most likely need a CT of the head to rule out further neurological injury. Is been placed back on full mechanical ventilatory support. Recent Labs  Lab 06/12/19 0213 06/13/19 0841 06/14/19 0549  NA 136 139 142  K 4.1 4.6 4.8  CL 96* 100 105  CO2 22 20* 19*  BUN 35* 56* 85*  CREATININE 2.49* 3.58* 4.06*  GLUCOSE 83 136* 215*   Recent Labs  Lab 06/12/19 0213 06/13/19 0841 06/14/19 0549  HGB 10.2* 11.0* 10.9*  HCT 33.5* 36.3* 35.4*  WBC 10.2 10.3 11.1*  PLT 366 377 336   DG Chest 1 View  Result Date: 06/13/2019 CLINICAL DATA:  Tracheomegaly. Congestive heart failure. EXAM: CHEST  1 VIEW COMPARISON:  Radiographs 06/11/2019 and 06/08/2019. CT 12/13/2018. FINDINGS: 1652 hours. Right IJ dialysis catheter and tracheostomy appear unchanged. The feeding tube has been advanced, projecting below the diaphragm, tip not visualized. There is stable cardiac enlargement. Diffuse bilateral airspace opacities have not significantly changed. There is no pneumothorax or significant pleural effusion. The bones appear unchanged. IMPRESSION: Interval advancement of the feeding tube. Otherwise stable chest with diffuse bilateral airspace opacities consistent with congestive heart failure. Electronically Signed   By: Richardean Sale M.D.   On: 06/13/2019 16:59   DG Chest Port 1 View  Result Date: 06/15/2019 CLINICAL DATA:  Code stroke. Hypoxia. EXAM: PORTABLE CHEST 1 VIEW COMPARISON:  06/14/2019 FINDINGS: Feeding tube tip is below the GE junction. Tracheostomy tube tip above carina. Right chest wall dialysis catheter is  identified with tips at the cavoatrial junction. Stable cardiac enlargement. Pneumo mediastinum appears stable to decreased in the interval. Persistent small biapical pneumothoraces. No change in diffuse bilateral lung opacities IMPRESSION: 1. Stable to improved pneumomediastinum. Persistence of small biapical pneumothoraces. 2. No change in aeration to lungs compared with previous exam. Electronically Signed   By: Kerby Moors M.D.   On: 06/15/2019 08:50   DG CHEST PORT 1 VIEW  Result Date: 06/14/2019 CLINICAL DATA:  Congestive heart failure, myocarditis EXAM: PORTABLE CHEST 1 VIEW COMPARISON:  06/13/2019 FINDINGS: Single frontal view of the chest demonstrates stable right internal jugular catheter, tracheostomy tube, and enteric catheter. The cardiac silhouette remains enlarged. Since the prior exam, trace biapical pneumothoraces have developed volume estimated approximately 5-10% each. Additionally, there is diffuse pneumomediastinum with subcutaneous gas dissecting into the supraclavicular regions. No effusion.  Persistent bilateral airspace disease. IMPRESSION: 1. Interval development of small biapical pneumothoraces and diffuse pneumomediastinum, possibly related to barotrauma. No tension effect. 2. Widespread bilateral airspace disease unchanged. 3. Continued enlarged cardiac silhouette. These results were called by telephone at the time of interpretation on 06/14/2019 at 7:53 pm to patient's nurse, Chester Holstein, who verbally acknowledged these results. Electronically Signed   By: Randa Ngo M.D.   On: 06/14/2019 19:53   ABG    Component Value Date/Time   PHART 7.478 (H) 06/15/2019 0050   PCO2ART 31.3 (L) 06/15/2019 0050   PO2ART 53.5 (L) 06/15/2019 0050   HCO3 22.6 06/15/2019 0050   TCO2 23 06/08/2019 0611   ACIDBASEDEF 0.3 06/15/2019 0050   O2SAT 79.6 06/15/2019 0050   I/P Acute on chronic respiratory failure prolonged  ventilatory distress, prolonged tracheostomy dependent.  This in the  setting of congestive heart failure generalized failure to thrive.  Noted to have bilateral pneumothoraces.   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

## 2019-06-15 NOTE — Progress Notes (Addendum)
PROGRESS NOTE    Carlos Brewer.  GNF:[id number] DOB: [birth year] DOA: 05/11/2019 PCP: Patient, No Pcp Per   Brief Narrative:  34 yo male with history of systolic heart failure EF 15%, myocarditis, tobacco abuse, cocaine abuse, nonischemic cardiomyopathy.  Patient presented to the ED after being found to have slurred speech and profound rt sided weakness.  Admitted with left MCA CVA and multiple smaller embolic infarcts s/p tPA and thrombectomy in IR.  Additionally found to have left ventricular  thrombus. Course complicated by septic shock 2/2 HCAP, AKI requiring CRRT/dialysis, polymorphic VT and cardiogenic shock/septic shock requiring Intermittent pressor dependence, required tracheostomy due to extubation difficulties.   Today, patient noted to be very lethargic, minimally responsive, in significant respiratory distress, unable to follow commands.  Change in clinical status as compared to the past previous days.  PCCM notified immediately, with subsequent transfer to ICU for further management.  Assessment & Plan:   Active Problems:   Occlusion of left middle cerebral artery   Middle cerebral artery embolism, left   Cerebrovascular accident (CVA) due to thrombosis of left middle cerebral artery (HCC)   Shock ([place])   Shock liver   Respiratory failure (HCC)   Pressure injury of skin   Tracheostomy tube present (HCC)   Acute CVA (cerebrovascular accident) ([place])   Acute kidney injury ([place])   Endotracheal tube present   Complication of tracheostomy tube ([place])   Acute hypoxemic respiratory failure (HCC)   Fever   Type 2 diabetes mellitus with other specified complication (HCC)   Hypoxia   HFrEF (heart failure with reduced ejection fraction) ([place])   Pneumothorax   Pneumomediastinum (HCC)   Acute hypoxic hypercapnic respiratory failure in the setting of CVA, HCAP pneumonia with difficulty weaning from ventilator status post tracheostomy On 06/15/2019, noted fever,  leukocytosis, worsening tachypnea (Cheyne-Stokes respiration), tachycardia, noted to be minimally responsive, very lethargic PCCM reconsulted, patient transferred to ICU, currently requiring full vent support on 06/15/19 CT chest shows small amount of pneumomediastinum but no extensive pneumothorax CT head does not show any evidence of bleeding Started Vanco, cefepime Further management by PCCM, appreciate recs  Acute metabolic/toxic encephalopathy in the setting of illicit substance abuse and large MCA territory infarct - Continue PT OT, disposition likely to be difficult given patient's profound weakness - Continue tube feeds per dietary given poor PO intake/swallowing difficulties - PEG tube approved by mother - ordered 06/13/19, tentatively planned for 3/30 but given elevated INR may need to delay - will defer to IR, appreciate assistance.  Shock: Multifactorial likely cardiogenic as well as septic in the setting of above as well as HCAP pneumonia, resolved - Cardiogenic shock appears to be well managed on current regimen, defer to cardiology  Left MCA likely embolic CVA with left pontine involvement - Continue PT, OT; disposition will likely need to be at skilled care given patient's profound weakness and likely total care needs, will follow up with family as patient progresses -candidate for LTAC at this point - [facility] has no HD beds - [facility] contacted  Kidney failure in the setting of acute tubular necrosis secondary to above cardiogenic shock hypoxia and hypotension; anuric on intermittent dialysis per nephrology - Patient being followed by nephrology - CRRT initiated on 04/23/2019 and recently transitioned to intermittent hemodialysis since 05/25/2019 - now on TTS schedule - UOP continues to be extremely poor  Intake/Output Summary (Last 24 hours) at 06/15/2019 1815 Last data filed at 06/15/2019 1800 Gross per 24 hour  Intake 318.82  ml  Output --  Net 318.82 ml   Lab Results   Component Value Date   CREATININE 4.06 (H) 06/14/2019   CREATININE 3.58 (H) 06/13/2019   CREATININE 2.49 (H) 06/12/2019   Diabetes Mellitus, type 2, POA -A1C 04/23/19 6.5 consistent with uncontrolled glucose and borderline DM2 -Continue levemir 40BID and sliding scale insulin - on tube feeds so moderately well controlled at this time.  HIT - Currently being managed by pharmacy - on bivalrubin; transitioning to warfarin for prolonged anticoagulation in the future (currently on hold given elevated INR and need for PEG placement)  Pressure injury of skin, multiple - Wound care and nursing to follow  DVT prophylaxis: Warfarin (currently on hold - elevated INR and need for PEG placement). Code Status: Full Family Communication: PCCM discussed with mother on 06/15/2019 Disposition Plan: Continue as inpatient, likely difficult disposition given patient's new total care status, PT recommending LTAC at this point, certainly reasonable given patient's respiratory and physical therapy needs. Family agreeable for LTAC placement - [facility] has no HD beds; [facility] contacted.. Patient will likely need prolonged physical therapy and treatment with hopes to continue to improve strength, speech and activity levels.  Significant Hospital Events   2/05 Admit, tPA, IR thrombectomy 2/6 100% on fvent at 2300  Overnight requiring increasing levophed and phenylephrine.    2/6: switched to levophed, epi, started antibiotics, started on CRRT.  2/9  developed wide-complex tachycardia and hypotension, started on amiodarone drip, increase Levophed drip 2/15 drop in platelets, heparin stopped, bival started 2/17 Hypotensive and back on Levophed 2/18 trach, lines changed 2/19 started milrinone , fever 103 2/20 Episode of wide-complex tachycardia yesterday, changed from Levophed to vasopressin 2/22 stopping vanc. Still on inotrope support. Some AF w/ RVR. Had to be placed back on pressors. ivabradine added 2/23 still  on pressors/ CRRT continued 2/24 tmax 98.4, Remains on CRRT, even UF, remains anuric, Levophed at 14 mcg/min, Milrinone at 0.125 mcg/kg/min, Coox 63.4 Doing well this morning on ATC, no events overnight.  Midodrine added   2/25 more interactive, remains on ATC, tmax 99.5/ WBC 21.4, ~900 ml liquid stool/ 24 hours, neg Cdiff, Levophed up to 20 mcg/min, CVP 2, ,milrinone remains at 0.125 mcg/kg/min, coox 92.7, Off CRRT since last night ~2000 s/p clotted x 3 off citrate, restarted  3/1 Amio drip restarted for WCT/atrial flutter, milrinone turned off 3/2 CRRT stopped, permacath placed 3/5 crrt resumed 3/6 started on pressors for crrt tolerance 3/8 No longer on CRRT or pressor support  3/12 Tolerated PS for >2 hours  3/17  febrile 106 overnight, shock on pressors, new lines placed, broad-spectrum antibiotics 3/22: Awake, appears comfortable on pressure support ventilation.  Currently weaning Precedex, he is back on low-dose norepinephrine 3/23 pm back on vent at hs for resp distress / desats ? chf on cxr  3/24 T-collar 24/7  3/26-3/28 - NG pulled out - family agreeable to PEG tube - continue PT/OT/SLP - likely candidate for LTAC in the next few days to week pending clinical course and placement options. 3/29-30 - Trach exchange for cuffless #6; repeat CXR to rule out ongoing aspiration pneumonitis, unlikely overt infection; PEG placement pending, more awake alert - LTAC approval pending. Medically improving - dispo pending location approval/availability   Consults:  Neuro IR Cardiology  Nephrology Heart Failure Team EP   Procedures:  2/5-2/6:  TPA given at 428 am (total of 90 Mg) 520 am went to IR  S/P Lt common carotid arteriogram followed by complete revascularization of  occluded LT MCA sup division mid M2 seg with x 1 pass with 13mx 40 mm solitaire X ret river device and penumbra aspiration with TICI3revascularization  ETT 2/05 >> 2/18 LIJ CVL 2/5 >>2/18  RT Early CVL 2/18  >>3/2 RIJ HD cath 2/6 >>2/18 6 shiley cuffed trach 2/18  >>3/4  RT fem HD 2/19 >>3/2 RIJ permacath 3/2>> 3/4 shiley 4 uncuffed.... dislodged placed back 6cuffed shiley that evening 3/5: change to #6 cuffed distal XLT  3/17 right femoral CVL >> 3/23 3/17 right femoral arterial line >>3/23  3/29 uncuffed #6 trach  Significant Diagnostic Tests:   CT angio head/neck 2/05 >> occlusion of Lt MCA bifurcation Echo 2/05 >> EF less than 20%, cannot rule out apical thrombus MRI brain 2/11 > Extensive acute infarction of multiple areas without large or medium vessel occlusion Echocardiogram 2/8 Left ventricular ejection fraction, by estimation, is <20%. The left  ventricle has severely decreased function. The left ventricle demonstrates  global hypokinesis. The left ventricular internal cavity size was severely dilated. Possible small 0.8 x 0.6 cm apical thrombus.   3/16 duplex upper and lower extremities >>negative  3/17 head CT > New area of hypoattenuation in the left pons, left frontal encephalomalacia related to old infarct  Micro Data:  SARS CoV2 PCR 2/05 >> negative Influenza PCR 2/05 >> negative ... Cdiff 2/25 >> neg Trach asp 3/11>> Few candida albicans BC 3/11>> 1 of 4 with Coag neg staph  BC 3/12 x 2 > neg  BC 3/17 x 2 > neg   Trach aspirate 3/24 >>> no  wbc, no organisms final - nl flora   Antimicrobials:  Meropenem 2/6 -2/12 Zosyn 2/6 , 2/19 >> 2/23 vanc 2/6 >> 2/9 , 2/19 >>2/22 Ceftriaxone 3/12> 3/16 3/17 vanc >> 3/19 3/17 Anidulafungin >> 3/19 3/17 cefepime >>  3/22  Objective: Vitals:   06/15/19 1630 06/15/19 1700 06/15/19 1730 06/15/19 1800  BP: 100/63 93/72 106/66 (!) 83/51  Pulse: (!) 117 (!) 114 (!) 115 (!) 111  Resp: (!) 22 (!) 28 (!) 30 20  Temp:      TempSrc:      SpO2: 100% 100% 100% 100%  Weight:      Height:        Intake/Output Summary (Last 24 hours) at 06/15/2019 1815 Last data filed at 06/15/2019 1800 Gross per 24 hour  Intake  318.82 ml  Output --  Net 318.82 ml   Filed Weights   06/11/19 1153 06/14/19 0825 06/14/19 1233  Weight: 106.5 kg 104.7 kg 101.3 kg    Examination:  General:  Very lethargic, minimally responsive, significant respiratory distress  Cardiovascular: S1, S2 present  Respiratory:  Tachypneic, bibasilar rhonchi noted  Abdomen: Soft, nontender, nondistended, bowel sounds present  Musculoskeletal: No bilateral pedal edema noted  Skin:  Sacral skin pressure injury, left heel pressure injury  Psychiatry:  Unable to assess  Neurology: Unable to follow commands   Data Reviewed: I have personally reviewed following labs and imaging studies  CBC: Recent Labs  Lab 06/09/19 0750 06/09/19 0750 06/10/19 1157 06/12/19 0213 06/13/19 0841 06/14/19 0549 06/15/19 1127  WBC 9.5  --  8.9 10.2 10.3 11.1*  --   HGB 8.8*   < > 9.8* 10.2* 11.0* 10.9* 10.9*  HCT 29.3*   < > 31.9* 33.5* 36.3* 35.4* 32.0*  MCV 108.1*  --  103.6* 104.0* 104.3* 102.6*  --   PLT 329  --  323 366 377 336  --    < > =  values in this interval not displayed.   Basic Metabolic Panel: Recent Labs  Lab 06/09/19 0750 06/09/19 0750 06/10/19 1157 06/10/19 1157 06/11/19 1448 06/12/19 0213 06/13/19 0841 06/14/19 0549 06/15/19 1127  NA 137   < > 135   < > 135 136 139 142 139  K 3.2*   < > 3.8   < > 4.9 4.1 4.6 4.8 5.6*  CL 100   < > 97*  --  98 96* 100 105  --   CO2 23   < > 22  --  23 22 20* 19*  --   GLUCOSE 183*   < > 188*  --  133* 83 136* 215*  --   BUN 70*   < > 59*  --  23* 35* 56* 85*  --   CREATININE 3.39*   < > 3.42*  --  2.09* 2.49* 3.58* 4.06*  --   CALCIUM 8.6*   < > 9.4  --  8.8* 9.6 10.0 9.5  --   PHOS 4.7*  --   --   --   --   --   --   --   --    < > = values in this interval not displayed.   GFR: Estimated Creatinine Clearance: 31.8 mL/min (A) (by C-G formula based on SCr of 4.06 mg/dL (H)). Liver Function Tests: Recent Labs  Lab 06/09/19 0750 06/10/19 1157  AST  --  35  ALT  --  29   ALKPHOS  --  162*  BILITOT  --  1.1  PROT  --  7.0  ALBUMIN 2.5* 2.7*   No results for input(s): LIPASE, AMYLASE in the last 168 hours. No results for input(s): AMMONIA in the last 168 hours. Coagulation Profile: Recent Labs  Lab 06/11/19 1152 06/12/19 0456 06/13/19 0841 06/14/19 0549 06/15/19 0322  INR 2.1* 2.4* 2.9* 4.2* 2.2*   Cardiac Enzymes: No results for input(s): CKTOTAL, CKMB, CKMBINDEX, TROPONINI in the last 168 hours. BNP (last 3 results) No results for input(s): PROBNP in the last 8760 hours. HbA1C: No results for input(s): HGBA1C in the last 72 hours. CBG: Recent Labs  Lab 06/14/19 2344 06/15/19 0430 06/15/19 0855 06/15/19 1144 06/15/19 1608  GLUCAP 192* 208* 240* 196* 212*   Lipid Profile: No results for input(s): CHOL, HDL, LDLCALC, TRIG, CHOLHDL, LDLDIRECT in the last 72 hours. Thyroid Function Tests: No results for input(s): TSH, T4TOTAL, FREET4, T3FREE, THYROIDAB in the last 72 hours. Anemia Panel: No results for input(s): VITAMINB12, FOLATE, FERRITIN, TIBC, IRON, RETICCTPCT in the last 72 hours. Sepsis Labs: No results for input(s): PROCALCITON, LATICACIDVEN in the last 168 hours.  Recent Results (from the past 240 hour(s))  Culture, respiratory (non-expectorated)     Status: None   Collection Time: 06/08/19  8:08 AM   Specimen: Tracheal Aspirate; Respiratory  Result Value Ref Range Status   Specimen Description TRACHEAL ASPIRATE  Final   Special Requests Normal  Final   Gram Stain NO WBC SEEN NO ORGANISMS SEEN   Final   Culture   Final    FEW Consistent with normal respiratory flora. Performed at [facility], [place] [address] [place], [place] [address]    Report Status 06/10/2019 FINAL  Final  Culture, respiratory (non-expectorated)     Status: None (Preliminary result)   Collection Time: 06/15/19  4:47 AM   Specimen: Tracheal Aspirate; Respiratory  Result Value Ref Range Status   Specimen Description TRACHEAL ASPIRATE  Final  Special Requests Normal  Final   Gram Stain   Final    FEW WBC PRESENT, PREDOMINANTLY PMN MODERATE GRAM POSITIVE COCCI IN PAIRS IN CLUSTERS Performed at [facility], [place] [address] [place], [place] [address]    Culture PENDING  Incomplete   Report Status PENDING  Incomplete    Radiology Studies: CT HEAD WO CONTRAST  Result Date: 06/15/2019 CLINICAL DATA:  Encephalopathy previous infarction EXAM: CT HEAD WITHOUT CONTRAST TECHNIQUE: Contiguous axial images were obtained from the base of the skull through the vertex without intravenous contrast. COMPARISON:  CT brain, 06/01/2019, MR brain, 04/28/2019 FINDINGS: Brain: Interval evolution of subacute to chronic bilateral frontal lobe infarctions primarily involving the operculum (series 3, image 21) as well as a large infarction of the right cerebellar hemisphere (series 3, image 11), seen acutely on prior MR dated 04/28/2019. There has also been interval evolution of infarction of the central and left pons (series 3, image 12). No new infarction or acute intracranial finding appreciated. Vascular: No hyperdense vessel or unexpected calcification. Skull: Normal. Negative for fracture or focal lesion. Sinuses/Orbits: No acute finding. Other: None. IMPRESSION: Expected interval evolution of subacute to chronic infarctions of the frontal lobes, right cerebellar hemisphere, and pons. No noncontrast CT evidence of new infarction or other acute intracranial pathology. Electronically Signed   By: Eddie Candle M.D.   On: 06/15/2019 11:38   CT CHEST WO CONTRAST  Result Date: 06/15/2019 CLINICAL DATA:  Pneumothorax, pneumomediastinum, abnormal chest radiograph EXAM: CT CHEST WITHOUT CONTRAST TECHNIQUE: Multidetector CT imaging of the chest was performed following the standard protocol without IV contrast. COMPARISON:  Chest radiograph, 06/15/2019, 10:23 a.m. FINDINGS: Cardiovascular: Large-bore right neck multi lumen vascular catheter, tip near the superior  cavoatrial junction. Cardiomegaly. No pericardial effusion. Mediastinum/Nodes: No enlarged mediastinal, hilar, or axillary lymph nodes. Extensive pneumomediastinum and subcutaneous emphysema in the lower neck. Thyroid gland, trachea, and esophagus demonstrate no significant findings. Lungs/Pleura: Tracheostomy. Extensive heterogeneous and consolidative airspace disease, worse in the right lung. Minimal, less than 5% bilateral pneumothoraces (series 3, image 79). Upper Abdomen: No acute abnormality. Musculoskeletal: No chest wall mass or suspicious bone lesions identified. IMPRESSION: 1. Extensive pneumomediastinum and subcutaneous emphysema in the lower neck. 2.  Minimal, less than 5% bilateral pneumothoraces. 3. Extensive heterogeneous and consolidative airspace disease, worse in the right lung, consistent with multifocal infection, edema, and/or ARDS. 4.  Tracheostomy. 5.  Cardiomegaly. Electronically Signed   By: Eddie Candle M.D.   On: 06/15/2019 11:43   DG Chest Port 1 View  Addendum Date: 06/15/2019   ADDENDUM REPORT: 06/15/2019 11:07 ADDENDUM: These results were called by telephone at the time of interpretation on 06/15/2019 at 11:07 am to provider Gwyndolyn Saxon MINOR , who verbally acknowledged these results. Specifically the possibility that the right pneumothorax in particular may be larger than expected based on appearance and perhaps associated with more pronounced anterior component was discussed. CT may be helpful for further assessment. Electronically Signed   By: Zetta Bills M.D.   On: 06/15/2019 11:07   Result Date: 06/15/2019 CLINICAL DATA:  Tracheostomy tube change. EXAM: PORTABLE CHEST 1 VIEW COMPARISON:  06/14/2019 and 06/15/2019 FINDINGS: Right IJ dialysis catheter remains in place tip at the caval to atrial junction. Tracheostomy tube in place terminating, tip over mid trachea. Heart size remains enlarged with globular appearance. Diffuse airspace disease in the right chest as before with  some medial lucency along the right heart border. Patchy airspace disease in the left chest similar to previous  study. Discrete pneumothorax in the left chest is not visible on the current study, seen on the more recent prior. There is some lucency along the left heart border and at the left costodiaphragmatic sulcus. Subcutaneous emphysema in the right neck similar to prior study. No acute bone finding. IMPRESSION: 1. Tracheostomy tube in place. 2. Persistent cardiomegaly with globular appearance of the heart. 3. Persistent diffuse airspace disease in the right chest greater than left. 4. Lucency along the left heart border and at the left costodiaphragmatic sulcus. This likely reflects pneumomediastinum that was seen on previous imaging. The pneumothorax with anterior component is also considered as the discrete pleural line seen at the lung apices are not seen on the current exam. Film may underestimate the size of pneumothoraces particularly in right chest though there is no significant change from the recent comparison study. 5. A call is out to the referring provider to further discuss findings in the above case. Electronically Signed: By: Zetta Bills M.D. On: 06/15/2019 10:50   DG Chest Port 1 View  Result Date: 06/15/2019 CLINICAL DATA:  Code stroke. Hypoxia. EXAM: PORTABLE CHEST 1 VIEW COMPARISON:  06/14/2019 FINDINGS: Feeding tube tip is below the GE junction. Tracheostomy tube tip above carina. Right chest wall dialysis catheter is identified with tips at the cavoatrial junction. Stable cardiac enlargement. Pneumo mediastinum appears stable to decreased in the interval. Persistent small biapical pneumothoraces. No change in diffuse bilateral lung opacities IMPRESSION: 1. Stable to improved pneumomediastinum. Persistence of small biapical pneumothoraces. 2. No change in aeration to lungs compared with previous exam. Electronically Signed   By: Kerby Moors M.D.   On: 06/15/2019 08:50   DG CHEST  PORT 1 VIEW  Result Date: 06/14/2019 CLINICAL DATA:  Congestive heart failure, myocarditis EXAM: PORTABLE CHEST 1 VIEW COMPARISON:  06/13/2019 FINDINGS: Single frontal view of the chest demonstrates stable right internal jugular catheter, tracheostomy tube, and enteric catheter. The cardiac silhouette remains enlarged. Since the prior exam, trace biapical pneumothoraces have developed volume estimated approximately 5-10% each. Additionally, there is diffuse pneumomediastinum with subcutaneous gas dissecting into the supraclavicular regions. No effusion.  Persistent bilateral airspace disease. IMPRESSION: 1. Interval development of small biapical pneumothoraces and diffuse pneumomediastinum, possibly related to barotrauma. No tension effect. 2. Widespread bilateral airspace disease unchanged. 3. Continued enlarged cardiac silhouette. These results were called by telephone at the time of interpretation on 06/14/2019 at 7:53 pm to patient's nurse, Chester Holstein, who verbally acknowledged these results. Electronically Signed   By: Randa Ngo M.D.   On: 06/14/2019 19:53   Scheduled Meds: . amiodarone  200 mg Per Tube BID  . ARIPiprazole  10 mg Per Tube Daily  . aspirin  81 mg Per Tube Daily  . atorvastatin  40 mg Per Tube q1800  . atropine  0.5 mg Intravenous Once  . chlorhexidine  15 mL Mouth Rinse BID  . chlorhexidine gluconate (MEDLINE KIT)  15 mL Mouth Rinse BID  . Chlorhexidine Gluconate Cloth  6 each Topical Q0600  . Chlorhexidine Gluconate Cloth  6 each Topical Q0600  . darbepoetin (ARANESP) injection - NON-DIALYSIS  150 mcg Subcutaneous Q Mon-1800  . feeding supplement (PRO-STAT SUGAR FREE 64)  60 mL Per Tube BID  . FLUoxetine  20 mg Per Tube Daily  . free water  100 mL Per Tube Q4H  . insulin aspart  0-20 Units Subcutaneous Q4H  . insulin aspart  7 Units Subcutaneous Q4H  . insulin detemir  40 Units Subcutaneous BID  . mouth  rinse  15 mL Mouth Rinse q12n4p  . midodrine  5 mg Per Tube Q8H  .  pantoprazole sodium  40 mg Per Tube Q1200  . sevelamer carbonate  2.4 g Per Tube TID WC  . sodium chloride flush  10-40 mL Intracatheter Q12H   Continuous Infusions: . sodium chloride Stopped (06/07/19 0938)  . sodium chloride Stopped (05/21/19 0940)  . bivalirudin (ANGIOMAX) infusion 0.5 mg/mL (Non-ACS indications) 0.05 mg/kg/hr (06/15/19 1228)  . ceFEPime (MAXIPIME) IV    . feeding supplement (VITAL 1.5 CAL) 60 mL/hr at 06/11/19 1600  . fentaNYL infusion INTRAVENOUS 75 mcg/hr (06/15/19 1734)  . [START ON 06/16/2019] vancomycin      LOS: 54 days     Alma Friendly, MD [facility]  If 7PM-7AM, please contact night-coverage [email or web address] 06/15/2019, 6:15 PM

## 2019-06-15 NOTE — Significant Event (Addendum)
Rapid Response Event Note  Overview: Time Called: 7076 Arrival Time: 0900 Event Type: Respiratory  Initial Focused Assessment: Patient with continuing O2 desat, cyclical respiratory pattern, and anxiety  BP 158/69  ST 117  RR 40-50  o2 sat 85-100%  Interventions: 2mg  Morphine IV  Transfer to ICU  4N29 ICU team at bedside  Plan of Care (if not transferred):  Event Summary: Name of Physician Notified: Eznduka at bedside prior to my arrival    at    Outcome: Transferred (Comment)  Event End:  [place]  Raliegh Ip

## 2019-06-15 NOTE — Progress Notes (Signed)
Pharmacy Antibiotic Note  Carlos Brewer. is a 34 y.o. male admitted on 05/15/2019 with stroke.  Pharmacy has been consulted for Vancomycin/Cefepime dosing for fever. Pt with prolonged hospital stay. WBC is elevated. Pt has ESRD on HD and is now on TTS schedule. It has been ~10 days since last vancomycin dose, will need re-loaded.   Plan: Vancomycin 2000 mg IV x 1, then 1000 mg IV qHD TTS Cefepime 1g IV q24h Trend WBC, temp, HD schedule F/U infectious work-up Drug levels as indicated   Height: 6\' 1"  (185.4 cm) Weight: 223 lb 5.2 oz (101.3 kg) IBW/kg (Calculated) : 79.9  Temp (24hrs), Avg:99.9 F (37.7 C), Min:98.9 F (37.2 C), Max:103.1 F (39.5 C)  Recent Labs  Lab 06/09/19 0750 06/09/19 0750 06/10/19 1157 06/11/19 1448 06/12/19 0213 06/13/19 0841 06/14/19 0549  WBC 9.5  --  8.9  --  10.2 10.3 11.1*  CREATININE 3.39*   < > 3.42* 2.09* 2.49* 3.58* 4.06*   < > = values in this interval not displayed.    Estimated Creatinine Clearance: 31.8 mL/min (A) (by C-G formula based on SCr of 4.06 mg/dL (H)).    Allergies  Allergen Reactions  . Heparin Other (See Comments)    Heparin induced thrombocytopenia. 2/15 HIT OD 1.692. 2/16 SRA positive-90.     Narda Bonds, PharmD, BCPS Clinical Pharmacist Phone: [phone]

## 2019-06-15 NOTE — Progress Notes (Signed)
ANTICOAGULATION CONSULT NOTE  Pharmacy Consult:  Bivalirudin  Indication: stroke 2/5, LV thrombus 2/8, HIT 2/15  Allergies  Allergen Reactions  . Heparin Other (See Comments)    Heparin induced thrombocytopenia. 2/15 HIT OD 1.692. 2/16 SRA positive-90.     Patient Measurements: Height: 6\' 1"  (185.4 cm) Weight: 223 lb 5.2 oz (101.3 kg) IBW/kg (Calculated) : 79.9   Vital Signs: Temp: 100.5 F (38.1 C) (03/31 0600) Temp Source: Axillary (03/31 0600) BP: 130/96 (03/31 0432) Pulse Rate: 119 (03/31 0600)  Labs: Recent Labs    06/13/19 0841 06/13/19 0841 06/14/19 0549 06/14/19 0841 06/14/19 2018 06/14/19 2336 06/15/19 0322  HGB 11.0*  --  10.9*  --   --   --   --   HCT 36.3*  --  35.4*  --   --   --   --   PLT 377  --  336  --   --   --   --   APTT 62*   < > 114*   < > 65* 58* 58*  LABPROT 30.4*  --  40.7*  --   --   --  24.6*  INR 2.9*  --  4.2*  --   --   --  2.2*  CREATININE 3.58*  --  4.06*  --   --   --   --    < > = values in this interval not displayed.    Estimated Creatinine Clearance: 31.8 mL/min (A) (by C-G formula based on SCr of 4.06 mg/dL (H)).  Assessment: 34 yr old male with history of ESRD on HD presented with left MCA infarct - received TPA and revascularization with IR on 2/5. On 2/8 found to have small LV apical thrombus on ECHO. Pharmacy consulted to dose IV heparin, which was switched to bivalirudin 2/15 for HIT. 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 SRA very positive at 36. Heparin allergy has appropriately been added to chart and updated with SRA results. Transitioned off bivalrudin on 3/14. Restarted bivalirudin 3/26.   APTT came back therapeutic at 58, on 0.05 mg/kg/hr. Warfarin remains on hold since 3/28 due to lack of access. INR trending down to 2.2 today. CBC stable. No s/sx of bleeding or infusion issues per RN.   Goal of Therapy:  APTT 50-85 seconds Monitor platelets by anticoagulation protocol: Yes    Plan:  Continue bivalirudin infusion at 0.05 mg/kg/hr  Monitor daily aPTT/CBC, s/sx bleeding   Antonietta Jewel, PharmD, [place] Pharmacist  Phone: [phone]  Please check AMION for all [place] phone numbers After 10:00 PM, call [place] [phone] 06/15/2019 7:22 AM

## 2019-06-15 NOTE — Progress Notes (Signed)
eLink Physician-Brief Progress Note Patient Name: Carlos Brewer. DOB: 07/10/1985 MRN: [id number]   Date of Service  06/15/2019  HPI/Events of Note  Fever to 103.1 F - Patient is not on antibiotics. AST and ALT both normal.   eICU Interventions  Will order: 1. Blood cultures X 2. 2. Tracheal aspirate culture. 3. Vancomycin and Cefepime per pharmacy consult. 4. Tylenol liquid 650 mg per tube Q 6 hours PRN Temp > 101.0 F.     Intervention Category Major Interventions: Infection - evaluation and management  Davena Julian Eugene 06/15/2019, 4:49 AM

## 2019-06-15 NOTE — Progress Notes (Signed)
PT Cancellation Note  Patient Details Name: Carlos Brewer. MRN: [id number] DOB: 17-May-1985   Cancelled Treatment:    Reason Eval/Treat Not Completed: Patient not medically ready Pt with rapid response event this am, transferred to 4N, and on ventilatory support. PT will continue to follow acutely.    Earney Navy, PTA Acute Rehabilitation Services Pager: [phone] Office: [phone]   06/15/2019, 11:31 AM

## 2019-06-15 NOTE — Progress Notes (Signed)
Nutrition Follow-up  DOCUMENTATION CODES:   Not applicable  INTERVENTION:   Continue:  Vital 1.5 @ 60 ml/hr (1440 ml/day) via Cortrak tube 60 ml Prostat BID  100 ml every 4 hours: 600 ml   Provides: 2560 kcal, 157 grams protein, and 1700 ml free water.    NUTRITION DIAGNOSIS:   Inadequate oral intake related to inability to eat as evidenced by NPO status. Ongoing.   GOAL:   Patient will meet greater than or equal to 90% of their needs Meeting with TF.   MONITOR:   TF tolerance  REASON FOR ASSESSMENT:   Ventilator   ASSESSMENT:   Pt with PMH of CHF, OSA, poorly controlled DM, cocaine abuse admitted with L MCA stroke s/p tPA and IR thrombectomy.   Per chart review pt has developed fever, chills, and greenish mucus from trach site. Per chest xray pt with development of small  pneumothoraces and diffuse pneumomediastinum.  Renal following for iHD. Last iHD was 3/30 with 3 L removed.  PEG placement delayed due to increased INR.  Plan for LTACH placement at time of discharge.   2/6 started on CRRT  2/18 s/p trach placement 3/10 CRRT stopped 3/12 iHD 3/17 febrile; developed septic shock on pressors, lines changed, on abx  3/29 s/p bronch, chest xray shows resolving LLL PNA due to aspiration with poor control of secretions  3/31 pt had rapid response on progressive unit, pt transferred to ICU s/p trach change and on currently on vent support    Medications reviewed and include:  7 units novolog every 4 hours, 40 units levemir BID, renvela TID    Labs reviewed: BUN: 85  Cr: 4.06 (H), K+ 5.6 (L), PO4: 4.7 CBGs:[id number]   UOP: 360 ml x 24 hrs  UF: 3,000 ml - 3/30  Diet Order:   Diet Order            Diet NPO time specified  Diet effective now              EDUCATION NEEDS:   No education needs have been identified at this time  Skin:  Skin Assessment: Skin Integrity Issues: Skin Integrity Issues:: DTI DTI: L heel Stage II: NA Other: skin tear to  coccyx and penis  Last BM:  small bm today per documentation  Height:   Ht Readings from Last 1 Encounters:  04/21/2019 6\' 1"  (1.854 m)    Weight:   Wt Readings from Last 1 Encounters:  06/14/19 101.3 kg    Ideal Body Weight:  83.6 kg  BMI:  Body mass index is 29.46 kg/m.  Estimated Nutritional Needs:   Kcal:  2500-2700  Protein:  150-170 grams  Fluid:  2 L/day  Lockie Pares., RD, LDN, CNSC See AMiON for contact information

## 2019-06-15 NOTE — Plan of Care (Signed)
  Problem: Nutrition: Goal: Adequate nutrition will be maintained Outcome: Progressing   

## 2019-06-15 NOTE — Progress Notes (Addendum)
ANTICOAGULATION CONSULT NOTE  Pharmacy Consult:  Bivalirudin  Indication: stroke 2/5, LV thrombus 2/8, HIT 2/15  Allergies  Allergen Reactions  . Heparin Other (See Comments)    Heparin induced thrombocytopenia. 2/15 HIT OD 1.692. 2/16 SRA positive-90.     Patient Measurements: Height: 6\' 1"  (185.4 cm) Weight: 223 lb 5.2 oz (101.3 kg) IBW/kg (Calculated) : 79.9   Vital Signs: Temp: 102.8 F (39.3 C) (03/31 1600) Temp Source: Axillary (03/31 1600) BP: 102/60 (03/31 1600) Pulse Rate: 117 (03/31 1600)  Labs: Recent Labs    06/13/19 0841 06/13/19 0841 06/14/19 0549 06/14/19 0841 06/14/19 2018 06/14/19 2336 06/15/19 0322 06/15/19 1127  HGB 11.0*   < > 10.9*  --   --   --   --  10.9*  HCT 36.3*  --  35.4*  --   --   --   --  32.0*  PLT 377  --  336  --   --   --   --   --   APTT 62*   < > 114*   < > 65* 58* 58*  --   LABPROT 30.4*  --  40.7*  --   --   --  24.6*  --   INR 2.9*  --  4.2*  --   --   --  2.2*  --   CREATININE 3.58*  --  4.06*  --   --   --   --   --    < > = values in this interval not displayed.    Estimated Creatinine Clearance: 31.8 mL/min (A) (by C-G formula based on SCr of 4.06 mg/dL (H)).  Assessment: 34 yr old male with history of ESRD on HD presented with left MCA infarct - received TPA and revascularization with IR on 2/5. On 2/8 found to have small LV apical thrombus on ECHO. Pharmacy consulted to dose IV heparin, which was switched to bivalirudin 2/15 for HIT. 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 SRA very positive at 23. Heparin allergy has appropriately been added to chart and updated with SRA results. Transitioned off bivalrudin on 3/14. Restarted bivalirudin 3/26.   APTT was scheduled for 1430 but RN tried phlebotomy twice with both samples hemolyzed. Spoke with IV team about accessing HD line but would need renal orders for this. Bival continues at 0.05 mg/kg/hr, which patient was previously therapeutic on.  HD continues, next planned for tomorrow. Warfarin remains on hold since 3/28 due to lack of access. INR down to  2.2 today due to bival effect. CBC stable. No s/sx of bleeding or infusion issues per RN. Will not try to draw another aPTT tonight given patient previously stable on this dose. F/u AM labs. Asked RN to call if any signs of bleeding.   Goal of Therapy:  APTT 50-85 seconds Monitor platelets by anticoagulation protocol: Yes   Plan:  Continue bivalirudin infusion at 0.05 mg/kg/hr  Monitor daily aPTT/CBC, s/sx bleeding  Benetta Spar, PharmD, BCPS, BCCP Clinical Pharmacist  Please check AMION for all [place] phone numbers After 10:00 PM, call [place] [phone]

## 2019-06-15 NOTE — Progress Notes (Signed)
NAME:  Carlos Brewer., MRN:  [id number], DOB:  [birth year], LOS: 48 ADMISSION DATE:  05/10/2019, CONSULTATION DATE:  05/06/2019 REFERRING MD:  Dr. Leonel Ramsay, CHIEF COMPLAINT:  Slurred speech  Brief History   34 yo male smoker found to have slurred speech and Rt sided weakness.  Admitted with left MCA CVA and multiple smaller embolic infarcts s/p tPA and thrombectomy in IR.  Additionally found to have left ventricular  thrombus.    Course complicated by septic shock r/t HCAP, AKI requiring CRRT, polymorphic VT and cardiogenic shock.  Intermittent pressor dependence, required tracheostomy Intermittent hemodialysis [place] CVA Heart failure with reduced ejection fraction < 15% on last echocardiogram  Past Medical History  Systolic CHF with non ischemic CM, Cocaine abuse, OSA, DM Hx of CHF (EF 15%)  Hx myocarditis Smoker 1/2 ppd   Significant Hospital Events   2/05 Admit, tPA, IR thrombectomy 2/6 100% on fvent at 2300  Overnight requiring increasing levophed and phenylephrine.    2/6: switched to levophed, epi, started antibiotics, started on CRRT.  2/9  developed wide-complex tachycardia and hypotension, started on amiodarone drip, increase Levophed drip 2/15 drop in platelets, heparin stopped, bival started 2/17 Hypotensive and back on Levophed 2/18 trach, lines changed 2/19 started milrinone , fever 103 2/20 Episode of wide-complex tachycardia yesterday, changed from Levophed to vasopressin 2/22 stopping vanc. Still on inotrope support. Some AF w/ RVR. Had to be placed back on pressors. ivabradine added 2/23 still on pressors/ CRRT continued 2/24 tmax 98.4, Remains on CRRT, even UF, remains anuric, Levophed at 14 mcg/min, Milrinone at 0.125 mcg/kg/min, Coox 63.4 Doing well this morning on ATC, no events overnight.  Midodrine added   2/25 more interactive, remains on ATC, tmax 99.5/ WBC 21.4, ~900 ml liquid stool/ 24 hours, neg Cdiff, Levophed up to 20 mcg/min, CVP 2,  ,milrinone remains at 0.125 mcg/kg/min, coox 92.7, Off CRRT since last night ~2000 s/p clotted x 3 off citrate, restarted  3/1 Amio drip restarted for WCT/atrial flutter, milrinone turned off 3/2 CRRT stopped, permacath placed 3/5 crrt resumed 3/6 started on pressors for crrt tolerance 3/8 No longer on CRRT or pressor support  3/12 Tolerated PS for >2 hours  3/17  febrile 106 overnight, shock on pressors, new lines placed, broad-spectrum antibiotics 3/22: Awake, appears comfortable on pressure support ventilation.  Currently weaning Precedex, he is back on low-dose norepinephrine 3/23 pm back on vent at hs for resp distress / desats ? chf on cxr  3/24 t collar 24/7 3/29 tolerating ATM without difficulty 3/31 overnight events noted-trach changed to 6 cuffless, bronched, new fever, started on antibiotics, pneumomediastinum small biapical pneumothoraces-stable   Consults:  Neuro IR Cardiology  Nephrology Heart Failure  EP   Procedures:  2/5-2/6:  TPA given at 428 am (total of 90 Mg) 520 am went to IR  S/P Lt common carotid arteriogram followed by complete revascularization of occluded LT MCA sup division mid M2 seg with x 1 pass with 5mx 40 mm solitaire X ret river device and penumbra aspiration with TICI 3 revascularization  ETT 2/05 >> 2/18 LIJ CVL 2/5 >>2/18  RT [place] CVL 2/18 >>3/2 RIJ HD cath 2/6 >>2/18 6 shiley cuffed trach 2/18  >>3/4  RT fem HD 2/19 >>3/2 RIJ permacath 3/2>> 3/4 shiley 4 uncuffed.... dislodged placed back 6cuffed shiley that evening 3/5: change to #6 cuffed distal XLT  3/17 right femoral CVL >> 3/23 3/17 right femoral arterial line >>3/23 3/31 bronchoscopy  Significant Diagnostic Tests:  CT angio head/neck 2/05 >> occlusion of Lt MCA bifurcation Echo 2/05 >> EF less than 20%, cannot rule out apical thrombus MRI brain 2/11 > extensive acute infarction of multiple areas without large or medium vessel occlusion Echocardiogram 2/8 Left ventricular  ejection fraction, by estimation, is <20%. The left  ventricle has severely decreased function. The left ventricle demonstrates  global hypokinesis. The left ventricular internal cavity size was severely  dilated. Possible small 0.8 x 0.6 cm apical thrombus.   3/16 duplex upper and lower extremities >>neg  3/17 head CT >.  New area of hypoattenuation in the left pons , left frontal encephalomalacia related to old infarct 3/31 chest x-ray with biapical small pneumothoraces, pneumomediastinum  Micro Data:  SARS CoV2 PCR 2/05 >> negative Influenza PCR 2/05 >> negative ... Cdiff 2/25 >> neg Trach asp 3/11>> Few candida albicans BC 3/11>> 1 of 4 with Coag neg staph  BC 3/12 x 2 > neg  BC 3/17 x 2 > neg   trach 3/24 >>> no  wbc, no organisms final - nl flora  Bronchoscopy 3/31>> Blood cultures 3/31>> Antimicrobials:  mero 2/6 -2/12 Zosyn 2/6 , 2/19 >> 2/23 vanc 2/6 >> 2/9 , 2/19 >>2/22 Ceftriaxone 3/12> 3/16 3/17 vanc >> 3/19 3/17 Anidulafungin >> 3/19 3/17 cefepime >>  3/22  3/31 vancomycin>> 3/31 cefepime>>  Interim history/subjective:  New fever 103.1 Tachypnea Pneumomediastinum, pneumothoraces Increased respiratory secretions Last dialysis 3/30 Objective   Blood pressure (!) 116/104, pulse (!) 114, temperature 100.3 F (37.9 C), temperature source Axillary, resp. rate (!) 53, height 6' 1"  (1.854 m), weight 101.3 kg, SpO2 98 %.    FiO2 (%):  [40 %-60 %] 60 %   Intake/Output Summary (Last 24 hours) at 06/15/2019 0858 Last data filed at 06/14/2019 1900 Gross per 24 hour  Intake 1093.5 ml  Output 3000 ml  Net -1906.5 ml   Filed Weights   06/11/19 1153 06/14/19 0825 06/14/19 1233  Weight: 106.5 kg 104.7 kg 101.3 kg     Physical Exam:   T-max 103.1 Did arouse to name calling this morning, currently lethargic Minimal bloody secretions on suctioning Lungs: Bilateral rhonchi, no crepitus, good air entry S1-S2 appreciated Abd obese, bowel sounds appreciated Extr  warm with no edema or clubbing noted    Resolved Hospital Problem list   HCAP 9/67, Acute metabolic encephalopathy 2nd to hypoxia and renal failure, Cardiogenic shock HIT   Assessment & Plan:   Pneumomediastinum, pneumothoraces -Follow-up chest x-ray shows improvement in pneumomediastinum -Small bibasilar apical pneumothoraces -No chest tube indicated -Continue with oxygen supplementation-this should help resumption of pneumomediastinum or pneumothoraces  New fever 103.1 -Cultures obtained -Follow bronchoscopy cultures, follow blood cultures -Continue vancomycin and cefepime at present  Respiratory failure -Multifactorial -Cyclic increasing respiratory rate -May be related to neurogenic hypoventilation -Repeat arterial blood gas  Encephalopathic -Repeat CT head without contrast -Repeat ABG  Circulatory shock Underlying cardiomyopathy Midodrine    Chronic systolic CHF, non-ischemic Polymorphic VT. Apical thrombus in left ventricle. HLD. Continuing aspirin, Lipitor, and amiodarone Telemetry INR 2.2, bivalirudin restarted  Acute toxic metabolic encephalopathy/[place] encephalopathy Hyperactive delirium  On Abilify, Prozac, Ambien Aggressive PT   Acute hypoxic, hypercapnic respiratory failure with compromised airway in setting of CVA.  Complicated by recurrent aspiration and failure to wean from vent s/p tracheostomy.  -Unfortunately it seems as though his cardiomyopathy/renal failure also playing a role in liberating him from ventilator Plan Tolerating trach collar well   Lt MCA CVA likely embolic. Left pontine CVA, ?new noted  on 3/17 Hx of cocaine abuse, depression. Plan Continuing supportive care   AKI from ATN in setting of hypoxia, cardiogenic shock. Off CVVH since 3/7 , tolerating IHD since 3/10 Plan per renal >> no response to diuresis  On intermittent hemodialysis Permanent access not planned due to poor overall prognosis  Fluid and electrolyte  imbalance/acid-base imbalance: Hypokalemia, anion gap metabolic acidosis Plan >>>  Need to be careful with KCl supplements  if not dialyzing     Anemia of critical illness no evidence of bleeding  Plan Trend CBC Transfusion per protocol    DM type II poorly controlled with hyperglycemia. Plan Levemir and SSI  Pumonary infiltrates/ recurrent 3/24 ? Chf/ pna/amiodarone toxicity  Unfortunately he does need amiodarone given his wide-complex tachycardia.  PEG approved as documented in Dr. Justus Memory notes Was on hold for elevated INR  Will transfer to the ICU Hemodynamic monitoring  The patient is critically ill with multiple organ systems failure and requires high complexity decision making for assessment and support, frequent evaluation and titration of therapies, application of advanced monitoring technologies and extensive interpretation of multiple databases. Critical Care Time devoted to patient care services described in this note independent of APP/resident time (if applicable)  is 35 minutes.   Sherrilyn Rist MD [facility] Personal pager: [phone] If unanswered, please page CCM On-call: [phone]

## 2019-06-15 NOTE — Procedures (Signed)
Arrived in ICU with respiratory distress He had a cuffless tracheostomy tube, this was emergently changed over bougie to #6 XLT cuffed ET tube to facilitate mechanical ventilation Good color change on capnometer. Saturations improved with initiation of ventilation Chest x-ray will be obtained Head CT will be obtained due to poor mental status and bilateral dilated pupils, reactive   Kiarra Kidd V. Elsworth Soho MD

## 2019-06-15 NOTE — Progress Notes (Signed)
SLP Cancellation Note  Patient Details Name: Carlos Brewer. MRN: [id number] DOB: 03-02-1986   Cancelled treatment:       Reason Eval/Treat Not Completed: Medical issues which prohibited therapy(Pt had rapid response event this morning and has been transferred to 4N with vent support. SLP will follow up on subsequent date.)  Diondre Pulis I. Hardin Negus, [place], [place] Office number [phone] Pager [place] 06/15/2019, 12:29 PM

## 2019-06-15 NOTE — Progress Notes (Signed)
[facility] ROUNDING NOTE   Subjective:   Brief history this is a 34 year old gentleman with a history of systolic heart failure myocarditis tobacco abuse cocaine abuse nonischemic cardiomyopathy.  He had presented to the emergency room 05/01/2019 with a left MCA CVA multiple small embolic infarcts status post TPA thrombectomy in IR.  His hospital course been complicated by septic shock hospital-acquired pneumonia acute kidney injury requiring CRRT and ultimately dialysis polymorphic ventricular tachycardia and cardiogenic shock/septic shock requiring intermittent pressor dependence.  Due to difficulty with vent wean he has required tracheostomy 05/05/2019.  The plan is for placement of PEG tube and LTAC. Marland Kitchen  Underwent bronchoscopy 06/13/2019.  Has developed fever chills and greenish mucus from trach site.  Underwent dialysis 06/14/2019 with removal of 3 L.  We can plan next dialysis treatment for 06/16/2019.  Blood pressure 130/96 pulse 103 temperature 103.1  ABG PO2 53.5 PCO2 31.3 pH 7.478  Labs 06/14/2019 sodium 142 potassium 4.8 chloride 105 CO2 19 BUN 85 creatinine 4.06 glucose 215 calcium 9.5.  Hemoglobin 11.1   Amiodarone 200 mg twice daily, Abilify 10 mg daily aspirin 81 mg daily atorvastatin 40 mg daily darbepoetin 150 mcg q. Monday Prozac 20 mg daily, insulin 7 units every 4 hours, insulin Levemir 40 units twice daily midodrine 5 mg every 8 hours Protonix 40 mg daily Renvela 2.4 g 3 times daily Coumadin dosing per pharmacy, Angiomax  IV vancomycin IV cefepime 06/15/2019  Last chest x-ray interval advancement of feeding tube otherwise stable chest with diffuse bilateral airspace opacities consistent with congestive heart failure 06/13/2019 Abdominal x-ray no enteric catheter identified  CT scan of head 06/01/2019 likely evolving region of encephalomalacia right frontal lobe corresponding with prior infarcts on MRI Chest x-ray 06/14/2019 interval development of small biapical  pneumothoraces diffuse pneumomediastinum probably related to barotrauma widespread bilateral airspace disease unchanged enlarged cardiac silhouette.  Objective:  Vital signs in last 24 hours:  Temp:  [98.9 F (37.2 C)-103.1 F (39.5 C)] 100.5 F (38.1 C) (03/31 0600) Pulse Rate:  [59-125] 119 (03/31 0600) Resp:  [16-65] 56 (03/31 0600) BP: (104-152)/(27-132) 130/96 (03/31 0432) SpO2:  [92 %-100 %] 100 % (03/31 0600) FiO2 (%):  [40 %-60 %] 60 % (03/31 0432) Weight:  [101.3 kg-104.7 kg] 101.3 kg (03/30 1233)  Weight change:  Filed Weights   06/11/19 1153 06/14/19 0825 06/14/19 1233  Weight: 106.5 kg 104.7 kg 101.3 kg    Intake/Output: I/O last 3 completed shifts: In: 1171.4 [I.V.:111.4; NG/GT:1060] Out: 3000 [Other:3000]   Intake/Output this shift:  No intake/output data recorded.  Gen: Appears dyspneic this morning using trach collar greenish mucus around trach site CVS: Pulse regular tachycardia, S1 and S2 normal.  Right IJ TDC Resp: Coarse/transmitted breath sounds bilaterally, no rales Abd: Soft, obese, nontender Ext: No lower extremity edema   Basic Metabolic Panel: Recent Labs  Lab 06/09/19 0750 06/09/19 0750 06/10/19 1157 06/10/19 1157 06/11/19 1448 06/11/19 1448 06/12/19 0213 06/13/19 0841 06/14/19 0549  NA 137   < > 135  --  135  --  136 139 142  K 3.2*   < > 3.8  --  4.9  --  4.1 4.6 4.8  CL 100   < > 97*  --  98  --  96* 100 105  CO2 23   < > 22  --  23  --  22 20* 19*  GLUCOSE 183*   < > 188*  --  133*  --  83 136* 215*  BUN  70*   < > 59*  --  23*  --  35* 56* 85*  CREATININE 3.39*   < > 3.42*  --  2.09*  --  2.49* 3.58* 4.06*  CALCIUM 8.6*   < > 9.4   < > 8.8*   < > 9.6 10.0 9.5  PHOS 4.7*  --   --   --   --   --   --   --   --    < > = values in this interval not displayed.    Liver Function Tests: Recent Labs  Lab 06/09/19 0750 06/10/19 1157  AST  --  35  ALT  --  29  ALKPHOS  --  162*  BILITOT  --  1.1  PROT  --  7.0  ALBUMIN 2.5*  2.7*   No results for input(s): LIPASE, AMYLASE in the last 168 hours. No results for input(s): AMMONIA in the last 168 hours.  CBC: Recent Labs  Lab 06/09/19 0750 06/10/19 1157 06/12/19 0213 06/13/19 0841 06/14/19 0549  WBC 9.5 8.9 10.2 10.3 11.1*  HGB 8.8* 9.8* 10.2* 11.0* 10.9*  HCT 29.3* 31.9* 33.5* 36.3* 35.4*  MCV 108.1* 103.6* 104.0* 104.3* 102.6*  PLT 329 323 366 377 336    Cardiac Enzymes: No results for input(s): CKTOTAL, CKMB, CKMBINDEX, TROPONINI in the last 168 hours.  BNP: Invalid input(s): POCBNP  CBG: Recent Labs  Lab 06/14/19 1133 06/14/19 1601 06/14/19 2008 06/14/19 2344 06/15/19 0430  GLUCAP 148* 138* 162* 192* 208*    Microbiology: Results for orders placed or performed during the hospital encounter of 04/28/2019  Respiratory Panel by RT PCR (Flu A&B, Covid) - Nasopharyngeal Swab     Status: None   Collection Time: 04/26/2019  4:42 AM   Specimen: Nasopharyngeal Swab  Result Value Ref Range Status   SARS Coronavirus 2 by RT PCR NEGATIVE NEGATIVE Final    Comment: (NOTE) SARS-CoV-2 target nucleic acids are NOT DETECTED. The SARS-CoV-2 RNA is generally detectable in upper respiratoy specimens during the acute phase of infection. The lowest concentration of SARS-CoV-2 viral copies this assay can detect is 131 copies/mL. A negative result does not preclude SARS-Cov-2 infection and should not be used as the sole basis for treatment or other patient management decisions. A negative result may occur with  improper specimen collection/handling, submission of specimen other than nasopharyngeal swab, presence of viral mutation(s) within the areas targeted by this assay, and inadequate number of viral copies (<131 copies/mL). A negative result must be combined with clinical observations, patient history, and epidemiological information. The expected result is Negative. Fact Sheet for Patients:  PinkCheek.be Fact Sheet for  Healthcare Providers:  GravelBags.it This test is not yet ap proved or cleared by the [place] FDA and  has been authorized for detection and/or diagnosis of SARS-CoV-2 by FDA under an Emergency Use Authorization (EUA). This EUA will remain  in effect (meaning this test can be used) for the duration of the COVID-19 declaration under Section 564(b)(1) of the Act, 21 U.S.C. section 360bbb-3(b)(1), unless the authorization is terminated or revoked sooner.    Influenza A by PCR NEGATIVE NEGATIVE Final   Influenza B by PCR NEGATIVE NEGATIVE Final    Comment: (NOTE) The Xpert Xpress SARS-CoV-2/FLU/RSV assay is intended as an aid in  the diagnosis of influenza from Nasopharyngeal swab specimens and  should not be used as a sole basis for treatment. Nasal washings and  aspirates are unacceptable for Xpert Xpress SARS-CoV-2/FLU/RSV  testing.  Fact Sheet for Patients: PinkCheek.be Fact Sheet for Healthcare Providers: GravelBags.it This test is not yet approved or cleared by the [place] FDA and  has been authorized for detection and/or diagnosis of SARS-CoV-2 by  FDA under an Emergency Use Authorization (EUA). This EUA will remain  in effect (meaning this test can be used) for the duration of the  Covid-19 declaration under Section 564(b)(1) of the Act, 21  U.S.C. section 360bbb-3(b)(1), unless the authorization is  terminated or revoked. Performed at [facility], [place] [address], [place], [place] [address]   MRSA PCR Screening     Status: Abnormal   Collection Time: 04/21/2019  1:56 PM   Specimen: Nasal Mucosa; Nasopharyngeal  Result Value Ref Range Status   MRSA by PCR POSITIVE (A) NEGATIVE Final    Comment:        The GeneXpert MRSA Assay (FDA approved for NASAL specimens only), is one component of a comprehensive MRSA colonization surveillance program. It is not intended to diagnose  MRSA infection nor to guide or monitor treatment for MRSA infections. RESULT CALLED TO, READ BACK BY AND VERIFIED WITH: Adrienne Mocha RN 16:20 05/03/2019 ([email or web address]) Performed at [facility], [place] [address] [place], [place] [address]   Culture, respiratory (non-expectorated)     Status: None   Collection Time: 04/23/19 10:58 AM   Specimen: Tracheal Aspirate; Respiratory  Result Value Ref Range Status   Specimen Description TRACHEAL ASPIRATE  Final   Special Requests NONE  Final   Gram Stain   Final    FEW WBC PRESENT, PREDOMINANTLY PMN FEW GRAM NEGATIVE COCCOBACILLI FEW GRAM POSITIVE COCCI IN PAIRS IN CLUSTERS RARE GRAM POSITIVE RODS    Culture   Final    ABUNDANT Consistent with normal respiratory flora. Performed at [facility], [place] [address] [place], [place] [address]    Report Status 04/25/2019 FINAL  Final  Culture, blood (Routine X 2) w Reflex to ID Panel     Status: None   Collection Time: 04/23/19 11:20 AM   Specimen: BLOOD  Result Value Ref Range Status   Specimen Description BLOOD RIGHT ANTECUBITAL  Final   Special Requests   Final    AEROBIC BOTTLE ONLY Blood Culture results may not be optimal due to an inadequate volume of blood received in culture bottles   Culture   Final    NO GROWTH 5 DAYS Performed at [facility], [place] [address] [place], [place] [address]    Report Status 04/28/2019 FINAL  Final  Culture, blood (Routine X 2) w Reflex to ID Panel     Status: None   Collection Time: 04/23/19 11:21 AM   Specimen: BLOOD  Result Value Ref Range Status   Specimen Description BLOOD RIGHT ANTECUBITAL  Final   Special Requests   Final    AEROBIC BOTTLE ONLY Blood Culture results may not be optimal due to an inadequate volume of blood received in culture bottles   Culture   Final    NO GROWTH 5 DAYS Performed at [facility], [place] [address], [place], [place] [address]    Report Status 04/28/2019 FINAL  Final  Culture, Urine     Status:  None   Collection Time: 04/23/19  1:56 PM   Specimen: Urine, Random  Result Value Ref Range Status   Specimen Description URINE, RANDOM  Final   Special Requests   Final    NONE Performed at [facility],  [address], [place], [place] [address]    Culture NO GROWTH  Final   Report Status 04/25/2019 FINAL  Final  Culture, respiratory (non-expectorated)     Status: None   Collection Time: 05/03/19 11:23 AM   Specimen: Tracheal Aspirate; Respiratory  Result Value Ref Range Status   Specimen Description TRACHEAL ASPIRATE  Final   Special Requests NONE  Final   Gram Stain   Final    MODERATE WBC PRESENT, PREDOMINANTLY PMN RARE GRAM POSITIVE COCCI    Culture   Final    Consistent with normal respiratory flora. Performed at [facility], [place] [address], [place], [place] [address]    Report Status 05/05/2019 FINAL  Final  Culture, blood (Routine X 2) w Reflex to ID Panel     Status: None   Collection Time: 05/06/19 11:45 AM   Specimen: BLOOD LEFT ARM  Result Value Ref Range Status   Specimen Description BLOOD LEFT ARM  Final   Special Requests   Final    BOTTLES DRAWN AEROBIC ONLY Blood Culture adequate volume   Culture   Final    NO GROWTH 5 DAYS Performed at [facility], [place] [address], [place], [place] [address]    Report Status 05/11/2019 FINAL  Final  Culture, blood (Routine X 2) w Reflex to ID Panel     Status: None   Collection Time: 05/06/19 12:20 PM   Specimen: BLOOD RIGHT ARM  Result Value Ref Range Status   Specimen Description BLOOD RIGHT ARM  Final   Special Requests   Final    BOTTLES DRAWN AEROBIC ONLY Blood Culture results may not be optimal due to an inadequate volume of blood received in culture bottles   Culture   Final    NO GROWTH 5 DAYS Performed at [facility], [place] [address] [place], [place] [address]    Report Status 05/11/2019 FINAL  Final  C difficile quick scan w PCR reflex     Status: None   Collection Time:  05/12/19 10:26 AM   Specimen: STOOL  Result Value Ref Range Status   C Diff antigen NEGATIVE NEGATIVE Final   C Diff toxin NEGATIVE NEGATIVE Final   C Diff interpretation No C. difficile detected.  Final    Comment: Performed at [facility], [place] [address], [place], [place] [address]  Culture, blood (routine x 2)     Status: None   Collection Time: 05/12/19  9:07 PM   Specimen: BLOOD LEFT HAND  Result Value Ref Range Status   Specimen Description BLOOD LEFT HAND  Final   Special Requests   Final    Blood Culture results may not be optimal due to an inadequate volume of blood received in culture bottles   Culture   Final    NO GROWTH 5 DAYS Performed at [facility], [place] [address], [place], [place] [address]    Report Status 05/17/2019 FINAL  Final  Culture, blood (routine x 2)     Status: None   Collection Time: 05/12/19  9:21 PM   Specimen: BLOOD RIGHT HAND  Result Value Ref Range Status   Specimen Description BLOOD RIGHT HAND  Final   Special Requests   Final    BOTTLES DRAWN AEROBIC ONLY Blood Culture results may not be optimal due to an inadequate volume of blood received in culture bottles   Culture   Final    NO GROWTH 5 DAYS Performed  at [facility], [place] [address] [place], [place] [address]    Report Status 05/17/2019 FINAL  Final  Culture, respiratory (non-expectorated)     Status: None   Collection Time: 05/20/19  8:06 PM   Specimen: Tracheal Aspirate; Respiratory  Result Value Ref Range Status   Specimen Description TRACHEAL ASPIRATE  Final   Special Requests NONE  Final   Gram Stain   Final    ABUNDANT WBC PRESENT, PREDOMINANTLY PMN NO ORGANISMS SEEN    Culture   Final    NO GROWTH 2 DAYS Performed at [facility], [address], [place], [place] [address]    Report Status 05/23/2019 FINAL  Final  MRSA PCR Screening     Status: Abnormal   Collection Time: 05/25/19 11:06 AM   Specimen: Nasal Mucosa; Nasopharyngeal  Result Value  Ref Range Status   MRSA by PCR POSITIVE (A) NEGATIVE Final    Comment:        The GeneXpert MRSA Assay (FDA approved for NASAL specimens only), is one component of a comprehensive MRSA colonization surveillance program. It is not intended to diagnose MRSA infection nor to guide or monitor treatment for MRSA infections. RESULT CALLED TO, READ BACK BY AND VERIFIED WITH: Silver Huguenin RN 13:15 05/25/19 ([email or web address]) Performed at [facility], [place] [address] [place], [place] [address]   Culture, blood (routine x 2)     Status: Abnormal   Collection Time: 05/26/19 10:45 AM   Specimen: BLOOD RIGHT HAND  Result Value Ref Range Status   Specimen Description BLOOD RIGHT HAND  Final   Special Requests AEROBIC BOTTLE ONLY Blood Culture adequate volume  Final   Culture  Setup Time   Final    GRAM POSITIVE COCCI AEROBIC BOTTLE ONLY CRITICAL RESULT CALLED TO, READ BACK BY AND VERIFIED WITH: PHARMD J STEENWYK [id number] MLM    Culture (A)  Final    STAPHYLOCOCCUS SPECIES (COAGULASE NEGATIVE) THE SIGNIFICANCE OF ISOLATING THIS ORGANISM FROM A SINGLE SET OF BLOOD CULTURES WHEN MULTIPLE SETS ARE DRAWN IS UNCERTAIN. PLEASE NOTIFY THE MICROBIOLOGY DEPARTMENT WITHIN ONE WEEK IF SPECIATION AND SENSITIVITIES ARE REQUIRED. Performed at [facility], [place] [address], [place], [place] [address]    Report Status 05/28/2019 FINAL  Final  Culture, blood (routine x 2)     Status: None   Collection Time: 05/26/19 10:50 AM   Specimen: BLOOD LEFT HAND  Result Value Ref Range Status   Specimen Description BLOOD LEFT HAND  Final   Special Requests   Final    AEROBIC BOTTLE ONLY Blood Culture results may not be optimal due to an inadequate volume of blood received in culture bottles   Culture   Final    NO GROWTH 5 DAYS Performed at [facility], [place] [address] [place], [place] [address]    Report Status 05/31/2019 FINAL  Final  Culture, respiratory     Status: None   Collection Time: 05/26/19  11:52 AM   Specimen: Tracheal Aspirate; Respiratory  Result Value Ref Range Status   Specimen Description TRACHEAL ASPIRATE  Final   Special Requests Normal  Final   Gram Stain   Final    MODERATE WBC PRESENT, PREDOMINANTLY PMN NO ORGANISMS SEEN Performed at [facility], [address] [place],  [address]    Culture FEW CANDIDA ALBICANS  Final   Report Status 05/29/2019 FINAL  Final  Culture, blood (routine x 2)  Status: None   Collection Time: 05/27/19  4:48 PM   Specimen: BLOOD  Result Value Ref Range Status   Specimen Description BLOOD RIGHT ANTECUBITAL  Final   Special Requests AEROBIC BOTTLE ONLY Blood Culture adequate volume  Final   Culture   Final    NO GROWTH 5 DAYS Performed at [facility], [address] [place], [place] [address]    Report Status 06/01/2019 FINAL  Final  Culture, blood (routine x 2)     Status: None   Collection Time: 05/27/19  4:53 PM   Specimen: BLOOD RIGHT HAND  Result Value Ref Range Status   Specimen Description BLOOD RIGHT HAND  Final   Special Requests   Final    AEROBIC BOTTLE ONLY Blood Culture results may not be optimal due to an inadequate volume of blood received in culture bottles   Culture   Final    NO GROWTH 5 DAYS Performed at [facility], [place] [address] [place], [place] [address]    Report Status 06/01/2019 FINAL  Final  Culture, blood (routine x 2)     Status: None   Collection Time: 06/01/19  3:50 AM   Specimen: BLOOD  Result Value Ref Range Status   Specimen Description BLOOD A-LINE  Final   Special Requests   Final    BOTTLES DRAWN AEROBIC AND ANAEROBIC Blood Culture adequate volume   Culture   Final    NO GROWTH 5 DAYS Performed at [facility], [place] [address], [place], [place] [address]    Report Status 06/06/2019 FINAL  Final  Culture, blood (Routine X 2) w Reflex to ID Panel     Status: None   Collection Time: 06/01/19  4:51 AM   Specimen: BLOOD RIGHT HAND  Result Value Ref  Range Status   Specimen Description BLOOD RIGHT HAND  Final   Special Requests   Final    BOTTLES DRAWN AEROBIC ONLY Blood Culture adequate volume   Culture   Final    NO GROWTH 5 DAYS Performed at [facility], [place] [address] [place], [place] [address]    Report Status 06/06/2019 FINAL  Final  Culture, respiratory (non-expectorated)     Status: None   Collection Time: 06/08/19  8:08 AM   Specimen: Tracheal Aspirate; Respiratory  Result Value Ref Range Status   Specimen Description TRACHEAL ASPIRATE  Final   Special Requests Normal  Final   Gram Stain NO WBC SEEN NO ORGANISMS SEEN   Final   Culture   Final    FEW Consistent with normal respiratory flora. Performed at [facility], [place] [address] [place],  [address]    Report Status 06/10/2019 FINAL  Final    Coagulation Studies: Recent Labs    06/13/19 0841 06/14/19 0549 06/15/19 0322  LABPROT 30.4* 40.7* 24.6*  INR 2.9* 4.2* 2.2*    Urinalysis: No results for input(s): COLORURINE, LABSPEC, PHURINE, GLUCOSEU, HGBUR, BILIRUBINUR, KETONESUR, PROTEINUR, UROBILINOGEN, NITRITE, LEUKOCYTESUR in the last 72 hours.  Invalid input(s): APPERANCEUR    Imaging: DG Chest 1 View  Result Date: 06/13/2019 CLINICAL DATA:  Tracheomegaly. Congestive heart failure. EXAM: CHEST  1 VIEW COMPARISON:  Radiographs 06/11/2019 and 06/08/2019. CT 12/13/2018. FINDINGS: 1652 hours. Right IJ dialysis catheter and tracheostomy appear unchanged. The feeding tube has been advanced, projecting below the diaphragm, tip not visualized. There is stable cardiac enlargement. Diffuse bilateral airspace opacities have not significantly changed. There is no pneumothorax or significant  pleural effusion. The bones appear unchanged. IMPRESSION: Interval advancement of the feeding tube. Otherwise stable chest with diffuse bilateral airspace opacities consistent with congestive heart failure. Electronically Signed   By: Richardean Sale M.D.   On:  06/13/2019 16:59   DG CHEST PORT 1 VIEW  Result Date: 06/14/2019 CLINICAL DATA:  Congestive heart failure, myocarditis EXAM: PORTABLE CHEST 1 VIEW COMPARISON:  06/13/2019 FINDINGS: Single frontal view of the chest demonstrates stable right internal jugular catheter, tracheostomy tube, and enteric catheter. The cardiac silhouette remains enlarged. Since the prior exam, trace biapical pneumothoraces have developed volume estimated approximately 5-10% each. Additionally, there is diffuse pneumomediastinum with subcutaneous gas dissecting into the supraclavicular regions. No effusion.  Persistent bilateral airspace disease. IMPRESSION: 1. Interval development of small biapical pneumothoraces and diffuse pneumomediastinum, possibly related to barotrauma. No tension effect. 2. Widespread bilateral airspace disease unchanged. 3. Continued enlarged cardiac silhouette. These results were called by telephone at the time of interpretation on 06/14/2019 at 7:53 pm to patient's nurse, Chester Holstein, who verbally acknowledged these results. Electronically Signed   By: Randa Ngo M.D.   On: 06/14/2019 19:53     Medications:   . sodium chloride Stopped (06/07/19 0938)  . sodium chloride Stopped (05/21/19 0940)  . bivalirudin (ANGIOMAX) infusion 0.5 mg/mL (Non-ACS indications) 0.05 mg/kg/hr (06/14/19 1900)  . ceFEPime (MAXIPIME) IV    . ceFEPime (MAXIPIME) IV    . feeding supplement (VITAL 1.5 CAL) 60 mL/hr at 06/11/19 1600  . [START ON 06/16/2019] vancomycin    . vancomycin 2,000 mg (06/15/19 0602)   . amiodarone  200 mg Per Tube BID  . ARIPiprazole  10 mg Per Tube Daily  . aspirin  81 mg Per Tube Daily  . atorvastatin  40 mg Per Tube q1800  . atropine  0.5 mg Intravenous Once  . chlorhexidine  15 mL Mouth Rinse BID  . chlorhexidine gluconate (MEDLINE KIT)  15 mL Mouth Rinse BID  . Chlorhexidine Gluconate Cloth  6 each Topical Q0600  . darbepoetin (ARANESP) injection - NON-DIALYSIS  150 mcg Subcutaneous Q  Mon-1800  . feeding supplement (PRO-STAT SUGAR FREE 64)  60 mL Per Tube BID  . FLUoxetine  20 mg Per Tube Daily  . free water  100 mL Per Tube Q4H  . insulin aspart  0-20 Units Subcutaneous Q4H  . insulin aspart  7 Units Subcutaneous Q4H  . insulin detemir  40 Units Subcutaneous BID  . mouth rinse  15 mL Mouth Rinse q12n4p  . midodrine  5 mg Per Tube Q8H  . pantoprazole sodium  40 mg Per Tube Q1200  . sevelamer carbonate  2.4 g Per Tube TID WC  . sodium chloride flush  10-40 mL Intracatheter Q12H   sodium chloride, acetaminophen (TYLENOL) oral liquid 160 mg/5 mL, ALPRAZolam, bisacodyl, docusate, lip balm, loperamide HCl, LORazepam, morphine injection, ondansetron (ZOFRAN) IV, sodium chloride flush, zolpidem  Assessment/ Plan:  1. Acute kidney Injury: Multifactorial with dense ATN following ischemic and nephrotoxic injury followed by cardiogenic shock.  Was started on CRRT on 04/23/2019 and recently transitioned to intermittent hemodialysis since 05/25/2019.  He is anuric and without any evidence of renal recovery with hemodialysis on TTS schedule.  Overall prognosis is poor with sequelae of CVA and continued dialysis dependency.  Tolerated dialysis 06/14/2019 with removal of 3 L. 2.  Acute CVA: Likely embolic with bilateral infarcts and LVEF thrombus for which he was treated with TPA on admission.  Significant residual neurological deficit with plan for LTAC.  Appears  to have residual encephalopathy. 3.  Hypoxic respiratory failure: Prolonged ventilator wean status post tracheostomy and now tolerating trach collar.  Chest x-ray showing pneumomediastinum and pneumothoraces 06/14/2019 4.  Hypertension: On midodrine at this time with fair blood pressure control allowing HD. 5.  Anemia: Status post intravenous iron and now on ESA.  No overt loss.  Continues on darbepoetin 150 mcg administered 06/13/2019 6. HIT currently being managed by pharmacy on bivalirudin transitioning to Coumadin anticoagulation 7.   Multiple pressure injury to skin 8.  Febrile illness blood cultures pending.  Patient started on vancomycin and cefepime 06/15/2019     LOS: [place] @TODAY @6 :48 AM

## 2019-06-15 NOTE — Progress Notes (Signed)
Patient transferred to 4N ICU, Mother Langley Gauss called and notified.

## 2019-06-15 NOTE — Progress Notes (Signed)
OT Cancellation Note  Patient Details Name: Carlos Brewer. MRN: [id number] DOB: June 05, 1985   Cancelled Treatment:    Reason Eval/Treat Not Completed: Medical issues which prohibited therapy Pt with rapid response event this am, transferred to 4N, and on ventilatory support. Will continue to follow as available and appropriate to continue OT POC.  Zenovia Jarred, MSOT, OTR/L Acute Rehabilitation Services [facility] Office Number: [phone] Pager: [phone]  Zenovia Jarred 06/15/2019, 11:52 AM

## 2019-06-15 NOTE — TOC Progression Note (Signed)
Transition of Care (TOC) - Progression Note    Patient Details  Name: Carlos Brewer. MRN: [id number] Date of Birth: 17-May-1985  Transition of Care [facility]) CM/SW [place], [place] Phone Number: 06/15/2019, 11:03 AM  Clinical Narrative:     CSW received vm from [place] stating he's [place] that [place] will need to speak to Geannie Risen [phone] ext [phone] patient coordinator at [facility].   CSW has left handoff and informed 4N Education officer, museum.   Expected Discharge Plan: Long Term Acute Care (LTAC) Barriers to Discharge: Continued Medical Work up  Expected Discharge Plan and Services Expected Discharge Plan: [place] (LTAC)                                               Social Determinants of Health (SDOH) Interventions    Readmission Risk Interventions No flowsheet data found.

## 2019-06-16 ENCOUNTER — Inpatient Hospital Stay (HOSPITAL_COMMUNITY): Payer: No Typology Code available for payment source

## 2019-06-16 DIAGNOSIS — G9340 Encephalopathy, unspecified: Secondary | ICD-10-CM | POA: Diagnosis not present

## 2019-06-16 DIAGNOSIS — N179 Acute kidney failure, unspecified: Secondary | ICD-10-CM | POA: Diagnosis not present

## 2019-06-16 DIAGNOSIS — J9601 Acute respiratory failure with hypoxia: Secondary | ICD-10-CM | POA: Diagnosis not present

## 2019-06-16 LAB — BASIC METABOLIC PANEL
Anion gap: 25 — ABNORMAL HIGH (ref 5–15)
Anion gap: 22 — ABNORMAL HIGH (ref 5–15)
BUN: 124 mg/dL — ABNORMAL HIGH (ref 6–20)
BUN: 94 mg/dL — ABNORMAL HIGH (ref 6–20)
CO2: 18 mmol/L — ABNORMAL LOW (ref 22–32)
CO2: 20 mmol/L — ABNORMAL LOW (ref 22–32)
Calcium: 9.7 mg/dL (ref 8.9–10.3)
Calcium: 9.2 mg/dL (ref 8.9–10.3)
Chloride: 101 mmol/L (ref 98–111)
Chloride: 99 mmol/L (ref 98–111)
Creatinine, Ser: 6.11 mg/dL — ABNORMAL HIGH (ref 0.61–1.24)
Creatinine, Ser: 4.38 mg/dL — ABNORMAL HIGH (ref 0.61–1.24)
GFR calc Af Amer: 13 mL/min — ABNORMAL LOW (ref >60)
GFR calc Af Amer: 19 mL/min — ABNORMAL LOW (ref >60)
GFR calc non Af Amer: 11 mL/min — ABNORMAL LOW (ref >60)
GFR calc non Af Amer: 16 mL/min — ABNORMAL LOW (ref >60)
Glucose, Bld: 125 mg/dL — ABNORMAL HIGH (ref 70–99)
Glucose, Bld: 174 mg/dL — ABNORMAL HIGH (ref 70–99)
Potassium: 7 mmol/L (ref 3.5–5.1)
Potassium: 5.4 mmol/L — ABNORMAL HIGH (ref 3.5–5.1)
Sodium: 144 mmol/L (ref 135–145)
Sodium: 141 mmol/L (ref 135–145)

## 2019-06-16 LAB — PROTIME-INR
INR: 2.6 — ABNORMAL HIGH (ref 0.8–1.2)
Prothrombin Time: 27.3 seconds — ABNORMAL HIGH (ref 11.4–15.2)

## 2019-06-16 LAB — APTT
aPTT: 72 seconds — ABNORMAL HIGH (ref 24–36)
aPTT: 65 seconds — ABNORMAL HIGH (ref 24–36)

## 2019-06-16 LAB — CBC
HCT: 33 % — ABNORMAL LOW (ref 39.0–52.0)
Hemoglobin: 9.7 g/dL — ABNORMAL LOW (ref 13.0–17.0)
MCH: 31 pg (ref 26.0–34.0)
MCHC: 29.4 g/dL — ABNORMAL LOW (ref 30.0–36.0)
MCV: 105.4 fL — ABNORMAL HIGH (ref 80.0–100.0)
Platelets: 345 10*3/uL (ref 150–400)
RBC: 3.13 MIL/uL — ABNORMAL LOW (ref 4.22–5.81)
RDW: 18.6 % — ABNORMAL HIGH (ref 11.5–15.5)
WBC: 17.7 10*3/uL — ABNORMAL HIGH (ref 4.0–10.5)
nRBC: 0 % (ref 0.0–0.2)

## 2019-06-16 LAB — GLUCOSE, CAPILLARY
Glucose-Capillary: 126 mg/dL — ABNORMAL HIGH (ref 70–99)
Glucose-Capillary: 107 mg/dL — ABNORMAL HIGH (ref 70–99)
Glucose-Capillary: 137 mg/dL — ABNORMAL HIGH (ref 70–99)
Glucose-Capillary: 150 mg/dL — ABNORMAL HIGH (ref 70–99)
Glucose-Capillary: 189 mg/dL — ABNORMAL HIGH (ref 70–99)
Glucose-Capillary: 166 mg/dL — ABNORMAL HIGH (ref 70–99)

## 2019-06-16 LAB — PHOSPHORUS: Phosphorus: 9.4 mg/dL — ABNORMAL HIGH (ref 2.5–4.6)

## 2019-06-16 LAB — MAGNESIUM: Magnesium: 2.6 mg/dL — ABNORMAL HIGH (ref 1.7–2.4)

## 2019-06-16 MED ORDER — VANCOMYCIN HCL 1250 MG/250ML IV SOLN
1250.0000 mg | INTRAVENOUS | Status: DC
  Administered 2019-06-16 – 2019-06-17 (×2): 1250 mg via INTRAVENOUS
  Filled 2019-06-16 (×3): qty 250

## 2019-06-16 MED ORDER — HEPARIN SODIUM (PORCINE) 1000 UNIT/ML DIALYSIS: 1000.0000 [IU] | INTRAMUSCULAR | Status: DC | PRN

## 2019-06-16 MED ORDER — PRISMASOL BGK 0/2.5 32-2.5 MEQ/L REPLACEMENT SOLN
Status: DC
  Filled 2019-06-16 (×5): qty 5000

## 2019-06-16 MED ORDER — SODIUM CHLORIDE 0.9 % IV SOLN
2.0000 g | Freq: Two times a day (BID) | INTRAVENOUS | Status: DC
  Administered 2019-06-16 – 2019-06-18 (×4): 2 g via INTRAVENOUS
  Filled 2019-06-16 (×5): qty 2

## 2019-06-16 MED ORDER — PRISMASOL BGK 0/2.5 32-2.5 MEQ/L REPLACEMENT SOLN
Status: DC
  Filled 2019-06-16 (×6): qty 5000

## 2019-06-16 MED ORDER — SODIUM CHLORIDE 0.9 % FOR CRRT
INTRAVENOUS_CENTRAL | Status: DC | PRN
  Filled 2019-06-16: qty 1000

## 2019-06-16 MED ORDER — PRISMASOL BGK 0/2.5 32-2.5 MEQ/L IV SOLN
INTRAVENOUS | Status: DC
  Filled 2019-06-16 (×21): qty 5000

## 2019-06-16 MED ORDER — NOREPINEPHRINE 4 MG/250ML-% IV SOLN
0.0000 ug/min | INTRAVENOUS | Status: DC
  Administered 2019-06-16: 2 ug/min via INTRAVENOUS
  Administered 2019-06-16 – 2019-06-17 (×2): 10 ug/min via INTRAVENOUS
  Filled 2019-06-16 (×3): qty 250

## 2019-06-16 NOTE — Progress Notes (Signed)
[facility] ROUNDING NOTE   Subjective:   Brief history this is a 34 year old gentleman with a history of systolic heart failure myocarditis tobacco abuse cocaine abuse nonischemic cardiomyopathy.  He had presented to the emergency room 05/13/2019 with a left MCA CVA multiple small embolic infarcts status post TPA thrombectomy in IR.  His hospital course been complicated by septic shock hospital-acquired pneumonia acute kidney injury requiring CRRT and ultimately dialysis polymorphic ventricular tachycardia and cardiogenic shock/septic shock requiring intermittent pressor dependence.  Due to difficulty with vent wean he has required tracheostomy 05/05/2019.  The plan is for placement of PEG tube and LTAC. Marland Kitchen  Underwent bronchoscopy 06/13/2019.  Has developed fever chills and greenish mucus from trach site.  Underwent dialysis 06/14/2019 with removal of 3 L.  Patient appears to have deteriorated 06/15/2019 and transferred to intensive care unit.  He is now hypotensive.  He is back on the ventilator.  Blood pressure 98/35 pulse 113 temperature 98.9 O2 sats 100% FiO2 30%    Sodium 144 potassium 7 chloride 101 CO2 18 BUN 124 creatinine 6.11 glucose 125 calcium 9.7 phosphorus 9.4 magnesium 2.6 hemoglobin 10.9   Amiodarone 200 mg twice daily, Abilify 10 mg daily aspirin 81 mg daily atorvastatin 40 mg daily darbepoetin 150 mcg q. Monday Prozac 20 mg daily, insulin 7 units every 4 hours, insulin Levemir 40 units twice daily midodrine 5 mg every 8 hours Protonix 40 mg daily Renvela 2.4 g 3 times daily Coumadin dosing per pharmacy, Angiomax  IV vancomycin IV cefepime 06/15/2019  Last chest x-ray interval advancement of feeding tube otherwise stable chest with diffuse bilateral airspace opacities consistent with congestive heart failure 06/13/2019 Abdominal x-ray no enteric catheter identified  CT scan of head expected interval evolution of subacute and chronic infarctions of the frontal lobes right  cerebellar hemisphere and pons.  No acute intracranial pathology noted 06/15/2019  CT scan of chest showed extensive pneumomediastinum with subcutaneous emphysema in lower neck less than 5% bilateral pneumothoraces and extensive hydrogenous consolidation airspace disease worse right consistent with multifocal infection edema ARDS tracheostomy.  Objective:  Vital signs in last 24 hours:  Temp:  [98.6 F (37 C)-102.8 F (39.3 C)] 98.9 F (37.2 C) (04/01 0400) Pulse Rate:  [100-120] 111 (04/01 0700) Resp:  [19-53] 20 (04/01 0700) BP: (71-158)/(46-104) 85/66 (04/01 0700) SpO2:  [89 %-100 %] 95 % (04/01 0746) FiO2 (%):  [30 %-100 %] 30 % (04/01 0746)  Weight change:  Filed Weights   06/11/19 1153 06/14/19 0825 06/14/19 1233  Weight: 106.5 kg 104.7 kg 101.3 kg    Intake/Output: I/O last 3 completed shifts: In: 2404.9 [I.V.:464.9; NG/GT:1840; IV Piggyback:100] Out: 350 [Urine:350]   Intake/Output this shift:  No intake/output data recorded.  Gen: Appears dyspneic this morning using trach collar greenish mucus around trach site CVS: Pulse regular tachycardia, S1 and S2 normal.  Right IJ TDC Resp: Coarse/transmitted breath sounds bilaterally, no rales Abd: Soft, obese, nontender Ext: No lower extremity edema   Basic Metabolic Panel: Recent Labs  Lab 06/11/19 1448 06/11/19 1448 06/12/19 0213 06/12/19 0213 06/13/19 0841 06/14/19 0549 06/15/19 1127 06/16/19 0637  NA 135   < > 136  --  139 142 139 144  K 4.9   < > 4.1  --  4.6 4.8 5.6* 7.0*  CL 98  --  96*  --  100 105  --  101  CO2 23  --  22  --  20* 19*  --  18*  GLUCOSE 133*  --  83  --  136* 215*  --  125*  BUN 23*  --  35*  --  56* 85*  --  124*  CREATININE 2.09*  --  2.49*  --  3.58* 4.06*  --  6.11*  CALCIUM 8.8*   < > 9.6   < > 10.0 9.5  --  9.7  MG  --   --   --   --   --   --   --  2.6*  PHOS  --   --   --   --   --   --   --  9.4*   < > = values in this interval not displayed.    Liver Function  Tests: Recent Labs  Lab 06/10/19 1157  AST 35  ALT 29  ALKPHOS 162*  BILITOT 1.1  PROT 7.0  ALBUMIN 2.7*   No results for input(s): LIPASE, AMYLASE in the last 168 hours. No results for input(s): AMMONIA in the last 168 hours.  CBC: Recent Labs  Lab 06/10/19 1157 06/12/19 0213 06/13/19 0841 06/14/19 0549 06/15/19 1127  WBC 8.9 10.2 10.3 11.1*  --   HGB 9.8* 10.2* 11.0* 10.9* 10.9*  HCT 31.9* 33.5* 36.3* 35.4* 32.0*  MCV 103.6* 104.0* 104.3* 102.6*  --   PLT 323 366 377 336  --     Cardiac Enzymes: No results for input(s): CKTOTAL, CKMB, CKMBINDEX, TROPONINI in the last 168 hours.  BNP: Invalid input(s): POCBNP  CBG: Recent Labs  Lab 06/15/19 1144 06/15/19 1608 06/15/19 1957 06/15/19 2333 06/16/19 0338  GLUCAP 196* 212* 235* 235* 126*    Microbiology: Results for orders placed or performed during the hospital encounter of 04/19/2019  Respiratory Panel by RT PCR (Flu A&B, Covid) - Nasopharyngeal Swab     Status: None   Collection Time: 04/20/2019  4:42 AM   Specimen: Nasopharyngeal Swab  Result Value Ref Range Status   SARS Coronavirus 2 by RT PCR NEGATIVE NEGATIVE Final    Comment: (NOTE) SARS-CoV-2 target nucleic acids are NOT DETECTED. The SARS-CoV-2 RNA is generally detectable in upper respiratoy specimens during the acute phase of infection. The lowest concentration of SARS-CoV-2 viral copies this assay can detect is 131 copies/mL. A negative result does not preclude SARS-Cov-2 infection and should not be used as the sole basis for treatment or other patient management decisions. A negative result may occur with  improper specimen collection/handling, submission of specimen other than nasopharyngeal swab, presence of viral mutation(s) within the areas targeted by this assay, and inadequate number of viral copies (<131 copies/mL). A negative result must be combined with clinical observations, patient history, and epidemiological information.  The expected result is Negative. Fact Sheet for Patients:  PinkCheek.be Fact Sheet for Healthcare Providers:  GravelBags.it This test is not yet ap proved or cleared by the [place] FDA and  has been authorized for detection and/or diagnosis of SARS-CoV-2 by FDA under an Emergency Use Authorization (EUA). This EUA will remain  in effect (meaning this test can be used) for the duration of the COVID-19 declaration under Section 564(b)(1) of the Act, 21 U.S.C. section 360bbb-3(b)(1), unless the authorization is terminated or revoked sooner.    Influenza A by PCR NEGATIVE NEGATIVE Final   Influenza B by PCR NEGATIVE NEGATIVE Final    Comment: (NOTE) The Xpert Xpress SARS-CoV-2/FLU/RSV assay is intended as an aid in  the diagnosis of influenza from Nasopharyngeal swab specimens and  should not be used as a sole basis  for treatment. Nasal washings and  aspirates are unacceptable for Xpert Xpress SARS-CoV-2/FLU/RSV  testing. Fact Sheet for Patients: PinkCheek.be Fact Sheet for Healthcare Providers: GravelBags.it This test is not yet approved or cleared by the [place] FDA and  has been authorized for detection and/or diagnosis of SARS-CoV-2 by  FDA under an Emergency Use Authorization (EUA). This EUA will remain  in effect (meaning this test can be used) for the duration of the  Covid-19 declaration under Section 564(b)(1) of the Act, 21  U.S.C. section 360bbb-3(b)(1), unless the authorization is  terminated or revoked. Performed at [facility], [place] [address], [place], [place] [address]   MRSA PCR Screening     Status: Abnormal   Collection Time: 04/21/2019  1:56 PM   Specimen: Nasal Mucosa; Nasopharyngeal  Result Value Ref Range Status   MRSA by PCR POSITIVE (A) NEGATIVE Final    Comment:        The GeneXpert MRSA Assay (FDA approved for NASAL  specimens only), is one component of a comprehensive MRSA colonization surveillance program. It is not intended to diagnose MRSA infection nor to guide or monitor treatment for MRSA infections. RESULT CALLED TO, READ BACK BY AND VERIFIED WITH: Adrienne Mocha RN 16:20 05/10/2019 ([email or web address]) Performed at [facility], [place] [address], [place], [place] [address]   Culture, respiratory (non-expectorated)     Status: None   Collection Time: 04/23/19 10:58 AM   Specimen: Tracheal Aspirate; Respiratory  Result Value Ref Range Status   Specimen Description TRACHEAL ASPIRATE  Final   Special Requests NONE  Final   Gram Stain   Final    FEW WBC PRESENT, PREDOMINANTLY PMN FEW GRAM NEGATIVE COCCOBACILLI FEW GRAM POSITIVE COCCI IN PAIRS IN CLUSTERS RARE GRAM POSITIVE RODS    Culture   Final    ABUNDANT Consistent with normal respiratory flora. Performed at [facility], [place] [address], [place], [place] [address]    Report Status 04/25/2019 FINAL  Final  Culture, blood (Routine X 2) w Reflex to ID Panel     Status: None   Collection Time: 04/23/19 11:20 AM   Specimen: BLOOD  Result Value Ref Range Status   Specimen Description BLOOD RIGHT ANTECUBITAL  Final   Special Requests   Final    AEROBIC BOTTLE ONLY Blood Culture results may not be optimal due to an inadequate volume of blood received in culture bottles   Culture   Final    NO GROWTH 5 DAYS Performed at [facility], [place] [address] [place], [place] [address]    Report Status 04/28/2019 FINAL  Final  Culture, blood (Routine X 2) w Reflex to ID Panel     Status: None   Collection Time: 04/23/19 11:21 AM   Specimen: BLOOD  Result Value Ref Range Status   Specimen Description BLOOD RIGHT ANTECUBITAL  Final   Special Requests   Final    AEROBIC BOTTLE ONLY Blood Culture results may not be optimal due to an inadequate volume of blood received in culture bottles   Culture   Final    NO GROWTH 5 DAYS Performed at [facility], [place] [address] [place], [place] [address]    Report Status 04/28/2019 FINAL  Final  Culture, Urine     Status: None   Collection Time: 04/23/19  1:56 PM   Specimen: Urine, Random  Result Value Ref Range Status   Specimen Description URINE, RANDOM  Final  Special Requests   Final    NONE Performed at [facility], [place] [address] [place], [place] [address]    Culture NO GROWTH  Final   Report Status 04/25/2019 FINAL  Final  Culture, respiratory (non-expectorated)     Status: None   Collection Time: 05/03/19 11:23 AM   Specimen: Tracheal Aspirate; Respiratory  Result Value Ref Range Status   Specimen Description TRACHEAL ASPIRATE  Final   Special Requests NONE  Final   Gram Stain   Final    MODERATE WBC PRESENT, PREDOMINANTLY PMN RARE GRAM POSITIVE COCCI    Culture   Final    Consistent with normal respiratory flora. Performed at [facility], [place] [address], [place], [place] [address]    Report Status 05/05/2019 FINAL  Final  Culture, blood (Routine X 2) w Reflex to ID Panel     Status: None   Collection Time: 05/06/19 11:45 AM   Specimen: BLOOD LEFT ARM  Result Value Ref Range Status   Specimen Description BLOOD LEFT ARM  Final   Special Requests   Final    BOTTLES DRAWN AEROBIC ONLY Blood Culture adequate volume   Culture   Final    NO GROWTH 5 DAYS Performed at [facility], [place] [address] [place], [place] [address]    Report Status 05/11/2019 FINAL  Final  Culture, blood (Routine X 2) w Reflex to ID Panel     Status: None   Collection Time: 05/06/19 12:20 PM   Specimen: BLOOD RIGHT ARM  Result Value Ref Range Status   Specimen Description BLOOD RIGHT ARM  Final   Special Requests   Final    BOTTLES DRAWN AEROBIC ONLY Blood Culture results may not be optimal due to an inadequate volume of blood received in culture bottles   Culture   Final    NO GROWTH 5 DAYS Performed at [facility], [place] [address], [place], [place]  [address]    Report Status 05/11/2019 FINAL  Final  C difficile quick scan w PCR reflex     Status: None   Collection Time: 05/12/19 10:26 AM   Specimen: STOOL  Result Value Ref Range Status   C Diff antigen NEGATIVE NEGATIVE Final   C Diff toxin NEGATIVE NEGATIVE Final   C Diff interpretation No C. difficile detected.  Final    Comment: Performed at [facility], [place] [address] [place], [place] [address]  Culture, blood (routine x 2)     Status: None   Collection Time: 05/12/19  9:07 PM   Specimen: BLOOD LEFT HAND  Result Value Ref Range Status   Specimen Description BLOOD LEFT HAND  Final   Special Requests   Final    Blood Culture results may not be optimal due to an inadequate volume of blood received in culture bottles   Culture   Final    NO GROWTH 5 DAYS Performed at [facility], [place] [address], [place], [place] [address]    Report Status 05/17/2019 FINAL  Final  Culture, blood (routine x 2)     Status: None   Collection Time: 05/12/19  9:21 PM   Specimen: BLOOD RIGHT HAND  Result Value Ref Range Status   Specimen Description BLOOD RIGHT HAND  Final   Special Requests   Final    BOTTLES DRAWN AEROBIC ONLY Blood Culture results may not be optimal due to an inadequate volume of blood received in culture  bottles   Culture   Final    NO GROWTH 5 DAYS Performed at [facility], [place] [address] [place], [place] [address]    Report Status 05/17/2019 FINAL  Final  Culture, respiratory (non-expectorated)     Status: None   Collection Time: 05/20/19  8:06 PM   Specimen: Tracheal Aspirate; Respiratory  Result Value Ref Range Status   Specimen Description TRACHEAL ASPIRATE  Final   Special Requests NONE  Final   Gram Stain   Final    ABUNDANT WBC PRESENT, PREDOMINANTLY PMN NO ORGANISMS SEEN    Culture   Final    NO GROWTH 2 DAYS Performed at [facility], [address] [place], [place] [address]    Report Status 05/23/2019 FINAL  Final  MRSA PCR  Screening     Status: Abnormal   Collection Time: 05/25/19 11:06 AM   Specimen: Nasal Mucosa; Nasopharyngeal  Result Value Ref Range Status   MRSA by PCR POSITIVE (A) NEGATIVE Final    Comment:        The GeneXpert MRSA Assay (FDA approved for NASAL specimens only), is one component of a comprehensive MRSA colonization surveillance program. It is not intended to diagnose MRSA infection nor to guide or monitor treatment for MRSA infections. RESULT CALLED TO, READ BACK BY AND VERIFIED WITH: Silver Huguenin RN 13:15 05/25/19 ([email or web address]) Performed at [facility], [place] [address] [place], [place] [address]   Culture, blood (routine x 2)     Status: Abnormal   Collection Time: 05/26/19 10:45 AM   Specimen: BLOOD RIGHT HAND  Result Value Ref Range Status   Specimen Description BLOOD RIGHT HAND  Final   Special Requests AEROBIC BOTTLE ONLY Blood Culture adequate volume  Final   Culture  Setup Time   Final    GRAM POSITIVE COCCI AEROBIC BOTTLE ONLY CRITICAL RESULT CALLED TO, READ BACK BY AND VERIFIED WITH: PHARMD J STEENWYK [id number] MLM    Culture (A)  Final    STAPHYLOCOCCUS SPECIES (COAGULASE NEGATIVE) THE SIGNIFICANCE OF ISOLATING THIS ORGANISM FROM A SINGLE SET OF BLOOD CULTURES WHEN MULTIPLE SETS ARE DRAWN IS UNCERTAIN. PLEASE NOTIFY THE MICROBIOLOGY DEPARTMENT WITHIN ONE WEEK IF SPECIATION AND SENSITIVITIES ARE REQUIRED. Performed at [facility], [place] [address], [place], [place] [address]    Report Status 05/28/2019 FINAL  Final  Culture, blood (routine x 2)     Status: None   Collection Time: 05/26/19 10:50 AM   Specimen: BLOOD LEFT HAND  Result Value Ref Range Status   Specimen Description BLOOD LEFT HAND  Final   Special Requests   Final    AEROBIC BOTTLE ONLY Blood Culture results may not be optimal due to an inadequate volume of blood received in culture bottles   Culture   Final    NO GROWTH 5 DAYS Performed at [facility], [place] [address]  [place], [place] [address]    Report Status 05/31/2019 FINAL  Final  Culture, respiratory     Status: None   Collection Time: 05/26/19 11:52 AM   Specimen: Tracheal Aspirate; Respiratory  Result Value Ref Range Status   Specimen Description TRACHEAL ASPIRATE  Final   Special Requests Normal  Final   Gram Stain   Final    MODERATE WBC PRESENT, PREDOMINANTLY PMN NO ORGANISMS SEEN Performed at [facility], [address] [place], [place] [address]    Culture FEW CANDIDA ALBICANS  Final  Report Status 05/29/2019 FINAL  Final  Culture, blood (routine x 2)     Status: None   Collection Time: 05/27/19  4:48 PM   Specimen: BLOOD  Result Value Ref Range Status   Specimen Description BLOOD RIGHT ANTECUBITAL  Final   Special Requests AEROBIC BOTTLE ONLY Blood Culture adequate volume  Final   Culture   Final    NO GROWTH 5 DAYS Performed at [facility], [address] [place], [place] [address]    Report Status 06/01/2019 FINAL  Final  Culture, blood (routine x 2)     Status: None   Collection Time: 05/27/19  4:53 PM   Specimen: BLOOD RIGHT HAND  Result Value Ref Range Status   Specimen Description BLOOD RIGHT HAND  Final   Special Requests   Final    AEROBIC BOTTLE ONLY Blood Culture results may not be optimal due to an inadequate volume of blood received in culture bottles   Culture   Final    NO GROWTH 5 DAYS Performed at [facility], [place] [address] [place], [place] [address]    Report Status 06/01/2019 FINAL  Final  Culture, blood (routine x 2)     Status: None   Collection Time: 06/01/19  3:50 AM   Specimen: BLOOD  Result Value Ref Range Status   Specimen Description BLOOD A-LINE  Final   Special Requests   Final    BOTTLES DRAWN AEROBIC AND ANAEROBIC Blood Culture adequate volume   Culture   Final    NO GROWTH 5 DAYS Performed at [facility], [place] [address] [place], [place] [address]    Report Status 06/06/2019 FINAL  Final  Culture, blood  (Routine X 2) w Reflex to ID Panel     Status: None   Collection Time: 06/01/19  4:51 AM   Specimen: BLOOD RIGHT HAND  Result Value Ref Range Status   Specimen Description BLOOD RIGHT HAND  Final   Special Requests   Final    BOTTLES DRAWN AEROBIC ONLY Blood Culture adequate volume   Culture   Final    NO GROWTH 5 DAYS Performed at [facility], [place] [address], [place], [place] [address]    Report Status 06/06/2019 FINAL  Final  Culture, respiratory (non-expectorated)     Status: None   Collection Time: 06/08/19  8:08 AM   Specimen: Tracheal Aspirate; Respiratory  Result Value Ref Range Status   Specimen Description TRACHEAL ASPIRATE  Final   Special Requests Normal  Final   Gram Stain NO WBC SEEN NO ORGANISMS SEEN   Final   Culture   Final    FEW Consistent with normal respiratory flora. Performed at [facility], [place] [address] [place], [place] [address]    Report Status 06/10/2019 FINAL  Final  Culture, respiratory (non-expectorated)     Status: None (Preliminary result)   Collection Time: 06/15/19  4:47 AM   Specimen: Tracheal Aspirate; Respiratory  Result Value Ref Range Status   Specimen Description TRACHEAL ASPIRATE  Final   Special Requests Normal  Final   Gram Stain   Final    FEW WBC PRESENT, PREDOMINANTLY PMN MODERATE GRAM POSITIVE COCCI IN PAIRS IN CLUSTERS    Culture   Final    CULTURE REINCUBATED FOR BETTER GROWTH Performed at [facility], [address], [place], [place] [address]    Report Status PENDING  Incomplete  Culture, blood (Routine X 2) w  Reflex to ID Panel     Status: None (Preliminary result)   Collection Time: 06/15/19  5:41 AM   Specimen: BLOOD  Result Value Ref Range Status   Specimen Description BLOOD RIGHT ARM  Final   Special Requests   Final    BOTTLES DRAWN AEROBIC AND ANAEROBIC Blood Culture adequate volume   Culture   Final    NO GROWTH 1 DAY Performed at [facility], [place] [address], [place], [place]  [address]    Report Status PENDING  Incomplete  Culture, blood (Routine X 2) w Reflex to ID Panel     Status: None (Preliminary result)   Collection Time: 06/15/19  5:46 AM   Specimen: BLOOD  Result Value Ref Range Status   Specimen Description BLOOD RIGHT ARM  Final   Special Requests   Final    BOTTLES DRAWN AEROBIC AND ANAEROBIC Blood Culture adequate volume   Culture   Final    NO GROWTH 1 DAY Performed at [facility], [place] [address], [place], [place] [address]    Report Status PENDING  Incomplete    Coagulation Studies: Recent Labs    06/13/19 0841 06/14/19 0549 06/15/19 0322  LABPROT 30.4* 40.7* 24.6*  INR 2.9* 4.2* 2.2*    Urinalysis: No results for input(s): COLORURINE, LABSPEC, PHURINE, GLUCOSEU, HGBUR, BILIRUBINUR, KETONESUR, PROTEINUR, UROBILINOGEN, NITRITE, LEUKOCYTESUR in the last 72 hours.  Invalid input(s): APPERANCEUR    Imaging: CT HEAD WO CONTRAST  Result Date: 06/15/2019 CLINICAL DATA:  Encephalopathy previous infarction EXAM: CT HEAD WITHOUT CONTRAST TECHNIQUE: Contiguous axial images were obtained from the base of the skull through the vertex without intravenous contrast. COMPARISON:  CT brain, 06/01/2019, MR brain, 04/28/2019 FINDINGS: Brain: Interval evolution of subacute to chronic bilateral frontal lobe infarctions primarily involving the operculum (series 3, image 21) as well as a large infarction of the right cerebellar hemisphere (series 3, image 11), seen acutely on prior MR dated 04/28/2019. There has also been interval evolution of infarction of the central and left pons (series 3, image 12). No new infarction or acute intracranial finding appreciated. Vascular: No hyperdense vessel or unexpected calcification. Skull: Normal. Negative for fracture or focal lesion. Sinuses/Orbits: No acute finding. Other: None. IMPRESSION: Expected interval evolution of subacute to chronic infarctions of the frontal lobes, right cerebellar hemisphere, and pons. No  noncontrast CT evidence of new infarction or other acute intracranial pathology. Electronically Signed   By: Eddie Candle M.D.   On: 06/15/2019 11:38   CT CHEST WO CONTRAST  Result Date: 06/15/2019 CLINICAL DATA:  Pneumothorax, pneumomediastinum, abnormal chest radiograph EXAM: CT CHEST WITHOUT CONTRAST TECHNIQUE: Multidetector CT imaging of the chest was performed following the standard protocol without IV contrast. COMPARISON:  Chest radiograph, 06/15/2019, 10:23 a.m. FINDINGS: Cardiovascular: Large-bore right neck multi lumen vascular catheter, tip near the superior cavoatrial junction. Cardiomegaly. No pericardial effusion. Mediastinum/Nodes: No enlarged mediastinal, hilar, or axillary lymph nodes. Extensive pneumomediastinum and subcutaneous emphysema in the lower neck. Thyroid gland, trachea, and esophagus demonstrate no significant findings. Lungs/Pleura: Tracheostomy. Extensive heterogeneous and consolidative airspace disease, worse in the right lung. Minimal, less than 5% bilateral pneumothoraces (series 3, image 79). Upper Abdomen: No acute abnormality. Musculoskeletal: No chest wall mass or suspicious bone lesions identified. IMPRESSION: 1. Extensive pneumomediastinum and subcutaneous emphysema in the lower neck. 2.  Minimal, less than 5% bilateral pneumothoraces. 3. Extensive heterogeneous and consolidative airspace disease, worse in the right lung, consistent with multifocal infection, edema, and/or ARDS. 4.  Tracheostomy.  5.  Cardiomegaly. Electronically Signed   By: Eddie Candle M.D.   On: 06/15/2019 11:43   DG Chest Port 1 View  Result Date: 06/16/2019 CLINICAL DATA:  Respiratory failure. EXAM: PORTABLE CHEST 1 VIEW COMPARISON:  CT 06/15/2019.  Chest x-ray 06/15/2019. FINDINGS: Tracheostomy tube, feeding tube, right central line in unchanged position. Pneumomediastinum again noted. Persistent very tiny biapical pneumothoraces cannot be completely excluded. Persistent right side greater than  left bilateral pulmonary infiltrates/edema again noted. Interim improvement of aeration in the right lung from prior exam. No prominent pleural effusion. Stable cardiomegaly. Subcutaneous emphysema about the neck and supraclavicular regions again noted. IMPRESSION: 1. Tracheostomy tube, feeding tube, right central line in unchanged position. 2. Pneumomediastinum again noted. Persistent very tiny biapical pneumothoraces cannot be completely excluded. Subcutaneous emphysema about the neck and supraclavicular regions again noted. 3. Persistent right side greater than left bilateral pulmonary infiltrates/edema again noted. Interim improvement of aeration right lung from prior exam. 4.  Stable cardiomegaly. Electronically Signed   By: Marcello Moores  Register   On: 06/16/2019 06:27   DG Chest Port 1 View  Addendum Date: 06/15/2019   ADDENDUM REPORT: 06/15/2019 11:07 ADDENDUM: These results were called by telephone at the time of interpretation on 06/15/2019 at 11:07 am to provider Gwyndolyn Saxon MINOR , who verbally acknowledged these results. Specifically the possibility that the right pneumothorax in particular may be larger than expected based on appearance and perhaps associated with more pronounced anterior component was discussed. CT may be helpful for further assessment. Electronically Signed   By: Zetta Bills M.D.   On: 06/15/2019 11:07   Result Date: 06/15/2019 CLINICAL DATA:  Tracheostomy tube change. EXAM: PORTABLE CHEST 1 VIEW COMPARISON:  06/14/2019 and 06/15/2019 FINDINGS: Right IJ dialysis catheter remains in place tip at the caval to atrial junction. Tracheostomy tube in place terminating, tip over mid trachea. Heart size remains enlarged with globular appearance. Diffuse airspace disease in the right chest as before with some medial lucency along the right heart border. Patchy airspace disease in the left chest similar to previous study. Discrete pneumothorax in the left chest is not visible on the current  study, seen on the more recent prior. There is some lucency along the left heart border and at the left costodiaphragmatic sulcus. Subcutaneous emphysema in the right neck similar to prior study. No acute bone finding. IMPRESSION: 1. Tracheostomy tube in place. 2. Persistent cardiomegaly with globular appearance of the heart. 3. Persistent diffuse airspace disease in the right chest greater than left. 4. Lucency along the left heart border and at the left costodiaphragmatic sulcus. This likely reflects pneumomediastinum that was seen on previous imaging. The pneumothorax with anterior component is also considered as the discrete pleural line seen at the lung apices are not seen on the current exam. Film may underestimate the size of pneumothoraces particularly in right chest though there is no significant change from the recent comparison study. 5. A call is out to the referring provider to further discuss findings in the above case. Electronically Signed: By: Zetta Bills M.D. On: 06/15/2019 10:50   DG Chest Port 1 View  Result Date: 06/15/2019 CLINICAL DATA:  Code stroke. Hypoxia. EXAM: PORTABLE CHEST 1 VIEW COMPARISON:  06/14/2019 FINDINGS: Feeding tube tip is below the GE junction. Tracheostomy tube tip above carina. Right chest wall dialysis catheter is identified with tips at the cavoatrial junction. Stable cardiac enlargement. Pneumo mediastinum appears stable to decreased in the interval. Persistent small biapical pneumothoraces. No change in diffuse bilateral  lung opacities IMPRESSION: 1. Stable to improved pneumomediastinum. Persistence of small biapical pneumothoraces. 2. No change in aeration to lungs compared with previous exam. Electronically Signed   By: Kerby Moors M.D.   On: 06/15/2019 08:50   DG CHEST PORT 1 VIEW  Result Date: 06/14/2019 CLINICAL DATA:  Congestive heart failure, myocarditis EXAM: PORTABLE CHEST 1 VIEW COMPARISON:  06/13/2019 FINDINGS: Single frontal view of the chest  demonstrates stable right internal jugular catheter, tracheostomy tube, and enteric catheter. The cardiac silhouette remains enlarged. Since the prior exam, trace biapical pneumothoraces have developed volume estimated approximately 5-10% each. Additionally, there is diffuse pneumomediastinum with subcutaneous gas dissecting into the supraclavicular regions. No effusion.  Persistent bilateral airspace disease. IMPRESSION: 1. Interval development of small biapical pneumothoraces and diffuse pneumomediastinum, possibly related to barotrauma. No tension effect. 2. Widespread bilateral airspace disease unchanged. 3. Continued enlarged cardiac silhouette. These results were called by telephone at the time of interpretation on 06/14/2019 at 7:53 pm to patient's nurse, Chester Holstein, who verbally acknowledged these results. Electronically Signed   By: Randa Ngo M.D.   On: 06/14/2019 19:53     Medications:   . sodium chloride Stopped (06/16/19 0139)  . sodium chloride Stopped (05/21/19 0940)  . bivalirudin (ANGIOMAX) infusion 0.5 mg/mL (Non-ACS indications) 0.05 mg/kg/hr (06/16/19 0700)  . ceFEPime (MAXIPIME) IV Stopped (06/15/19 2332)  . feeding supplement (VITAL 1.5 CAL) Stopped (06/16/19 0000)  . fentaNYL infusion INTRAVENOUS 25 mcg/hr (06/16/19 0700)  . prismasol BGK 0/2.5    . prismasol BGK 0/2.5    . prismasol BGK 0/2.5    . vancomycin     . amiodarone  200 mg Per Tube BID  . ARIPiprazole  10 mg Per Tube Daily  . aspirin  81 mg Per Tube Daily  . atorvastatin  40 mg Per Tube q1800  . atropine  0.5 mg Intravenous Once  . chlorhexidine  15 mL Mouth Rinse BID  . chlorhexidine gluconate (MEDLINE KIT)  15 mL Mouth Rinse BID  . Chlorhexidine Gluconate Cloth  6 each Topical Q0600  . Chlorhexidine Gluconate Cloth  6 each Topical Q0600  . darbepoetin (ARANESP) injection - NON-DIALYSIS  150 mcg Subcutaneous Q Mon-1800  . feeding supplement (PRO-STAT SUGAR FREE 64)  60 mL Per Tube BID  . FLUoxetine  20 mg  Per Tube Daily  . free water  100 mL Per Tube Q4H  . insulin aspart  0-20 Units Subcutaneous Q4H  . insulin aspart  7 Units Subcutaneous Q4H  . insulin detemir  40 Units Subcutaneous BID  . mouth rinse  15 mL Mouth Rinse q12n4p  . midodrine  5 mg Per Tube Q8H  . pantoprazole sodium  40 mg Per Tube Q1200  . sevelamer carbonate  2.4 g Per Tube TID WC  . sodium chloride flush  10-40 mL Intracatheter Q12H   sodium chloride, acetaminophen (TYLENOL) oral liquid 160 mg/5 mL, ALPRAZolam, bisacodyl, docusate, fentaNYL (SUBLIMAZE) injection, heparin, lip balm, loperamide HCl, LORazepam, midazolam, morphine injection, ondansetron (ZOFRAN) IV, sodium chloride, sodium chloride flush, zolpidem  Assessment/ Plan:  1. Acute kidney Injury: Multifactorial with dense ATN following ischemic and nephrotoxic injury followed by cardiogenic shock.  Was started on CRRT on 04/23/2019 and recently transitioned to intermittent hemodialysis since 05/25/2019.  He is anuric and without any evidence of renal recovery with hemodialysis on TTS schedule.  Overall prognosis is poor with sequelae of CVA and continued dialysis dependency.  Tolerated dialysis 06/14/2019 with removal of 3 L.  Patient decompensated 06/15/2019 and  is now in the intensive care unit.  Hypotension we will restart CRRT 06/16/2019. 2.  Acute CVA: Likely embolic with bilateral infarcts and LVEF thrombus for which he was treated with TPA on admission.  Significant residual neurological deficit with plan for LTAC.  Appears to have residual encephalopathy. 3.  Hypoxic respiratory failure: Prolonged ventilator wean status post tracheostomy and now tolerating trach collar.  Chest x-ray showing pneumomediastinum and pneumothoraces 06/14/2019 4.  Hypertension: On midodrine at this time with fair blood pressure control allowing HD. 5.  Anemia: Status post intravenous iron and now on ESA.  No overt loss.  Continues on darbepoetin 150 mcg administered 06/13/2019 6. HIT currently  being managed by pharmacy on bivalirudin transitioning to Coumadin anticoagulation 7.  Multiple pressure injury to skin 8.  Febrile illness blood cultures pending.  Patient started on vancomycin and cefepime 06/15/2019     LOS: [place] @TODAY @8 :24 AM

## 2019-06-16 NOTE — Progress Notes (Signed)
NAME:  Juana Montini., MRN:  [id number], DOB:  1985-12-20, LOS: 18 ADMISSION DATE:  05/03/2019, CONSULTATION DATE:  05/08/2019 REFERRING MD:  Dr. Leonel Ramsay, CHIEF COMPLAINT:  Slurred speech  Brief History   34 yo male smoker found to have slurred speech and Rt sided weakness.  Admitted with left MCA CVA and multiple smaller embolic infarcts s/p tPA and thrombectomy in IR.  Additionally found to have left ventricular  thrombus.    Course complicated by septic shock r/t HCAP, AKI requiring CRRT, polymorphic VT and cardiogenic shock.  Intermittent pressor dependence, required tracheostomy Intermittent hemodialysis [place] CVA Heart failure with reduced ejection fraction < 15% on last echocardiogram  Past Medical History  Systolic CHF with non ischemic CM, Cocaine abuse, OSA, DM Hx of CHF (EF 15%)  Hx myocarditis Smoker 1/2 ppd   Significant Hospital Events   2/05 Admit, tPA, IR thrombectomy 2/6 100% on fvent at 2300  Overnight requiring increasing levophed and phenylephrine.    2/6: switched to levophed, epi, started antibiotics, started on CRRT.  2/9  developed wide-complex tachycardia and hypotension, started on amiodarone drip, increase Levophed drip 2/15 drop in platelets, heparin stopped, bival started 2/17 Hypotensive and back on Levophed 2/18 trach, lines changed 2/19 started milrinone , fever 103 2/20 Episode of wide-complex tachycardia yesterday, changed from Levophed to vasopressin 2/22 stopping vanc. Still on inotrope support. Some AF w/ RVR. Had to be placed back on pressors. ivabradine added 2/23 still on pressors/ CRRT continued 2/24 tmax 98.4, Remains on CRRT, even UF, remains anuric, Levophed at 14 mcg/min, Milrinone at 0.125 mcg/kg/min, Coox 63.4 Doing well this morning on ATC, no events overnight.  Midodrine added   2/25 more interactive, remains on ATC, tmax 99.5/ WBC 21.4, ~900 ml liquid stool/ 24 hours, neg Cdiff, Levophed up to 20 mcg/min, CVP 2,  ,milrinone remains at 0.125 mcg/kg/min, coox 92.7, Off CRRT since last night ~2000 s/p clotted x 3 off citrate, restarted  3/1 Amio drip restarted for WCT/atrial flutter, milrinone turned off 3/2 CRRT stopped, permacath placed 3/5 crrt resumed 3/6 started on pressors for crrt tolerance 3/8 No longer on CRRT or pressor support  3/12 Tolerated PS for >2 hours  3/17  febrile 106 overnight, shock on pressors, new lines placed, broad-spectrum antibiotics 3/22: Awake, appears comfortable on pressure support ventilation.  Currently weaning Precedex, he is back on low-dose norepinephrine 3/23 pm back on vent at hs for resp distress / desats ? chf on cxr  3/24 t collar 24/7 3/29 tolerating ATM without difficulty 3/31 overnight events noted-trach changed to 6 cuffless, bronched, new fever, started on antibiotics, pneumomediastinum small biapical pneumothoraces-stable   Consults:  Neuro IR Cardiology  Nephrology Heart Failure  EP   Procedures:  2/5-2/6:  TPA given at 428 am (total of 90 Mg) 520 am went to IR  S/P Lt common carotid arteriogram followed by complete revascularization of occluded LT MCA sup division mid M2 seg with x 1 pass with 7mx 40 mm solitaire X ret river device and penumbra aspiration with TICI 3 revascularization  ETT 2/05 >> 2/18 LIJ CVL 2/5 >>2/18  RT  CVL 2/18 >>3/2 RIJ HD cath 2/6 >>2/18 6 shiley cuffed trach 2/18  >>3/4  RT fem HD 2/19 >>3/2 RIJ permacath 3/2>> 3/4 shiley 4 uncuffed.... dislodged placed back 6cuffed shiley that evening 3/5: change to #6 cuffed distal XLT  3/17 right femoral CVL >> 3/23 3/17 right femoral arterial line >>3/23 3/31 bronchoscopy  Significant Diagnostic Tests:  CT angio head/neck 2/05 >> occlusion of Lt MCA bifurcation Echo 2/05 >> EF less than 20%, cannot rule out apical thrombus MRI brain 2/11 > extensive acute infarction of multiple areas without large or medium vessel occlusion Echocardiogram 2/8 Left ventricular  ejection fraction, by estimation, is <20%. The left  ventricle has severely decreased function. The left ventricle demonstrates  global hypokinesis. The left ventricular internal cavity size was severely  dilated. Possible small 0.8 x 0.6 cm apical thrombus.   3/16 duplex upper and lower extremities >>neg  3/17 head CT >.  New area of hypoattenuation in the left pons , left frontal encephalomalacia related to old infarct 3/31 chest x-ray with biapical small pneumothoraces, pneumomediastinum  Micro Data:  SARS CoV2 PCR 2/05 >> negative Influenza PCR 2/05 >> negative ... Cdiff 2/25 >> neg Trach asp 3/11>> Few candida albicans BC 3/11>> 1 of 4 with Coag neg staph  BC 3/12 x 2 > neg  BC 3/17 x 2 > neg   trach 3/24 >>> no  wbc, no organisms final - nl flora  Bronchoscopy 3/31>> Blood cultures 3/31>> Antimicrobials:  mero 2/6 -2/12 Zosyn 2/6 , 2/19 >> 2/23 vanc 2/6 >> 2/9 , 2/19 >>2/22 Ceftriaxone 3/12> 3/16 3/17 vanc >> 3/19 3/17 Anidulafungin >> 3/19 3/17 cefepime >>  3/22  3/31 vancomycin>> 3/31 cefepime>>  Interim history/subjective:  New fever 103.1 Tachypnea Pneumomediastinum, pneumothoraces Increased respiratory secretions Last dialysis 3/30 Objective   Blood pressure (!) 85/66, pulse (!) 111, temperature 98.9 F (37.2 C), temperature source Axillary, resp. rate 20, height _0  (1.854 m), weight 101.3 kg, SpO2 95 %.    Vent Mode: PRVC FiO2 (%):  [30 %-100 %] 30 % Set Rate:  [20 bmp] 20 bmp Vt Set:  [640 mL] 640 mL PEEP:  [5 cmH20] 5 cmH20 Pressure Support:  [14 cmH20] 14 cmH20 Plateau Pressure:  [22 cmH20-28 cmH20] 25 cmH20   Intake/Output Summary (Last 24 hours) at 06/16/2019 0825 Last data filed at 06/16/2019 0700 Gross per 24 hour  Intake 2404.92 ml  Output 350 ml  Net 2054.92 ml   Filed Weights   06/11/19 1153 06/14/19 0825 06/14/19 1233  Weight: 106.5 kg 104.7 kg 101.3 kg     Physical Exam:   T-max 103.1 Did arouse to name calling this morning,  currently lethargic Minimal bloody secretions on suctioning Lungs: Bilateral rhonchi, no crepitus, good air entry S1-S2 appreciated Abd obese, bowel sounds appreciated Extr warm with no edema or clubbing noted    Resolved Hospital Problem list   HCAP 6/50, Acute metabolic encephalopathy 2nd to hypoxia and renal failure, Cardiogenic shock HIT   Assessment & Plan:  Trach and vent dependent respiratory failure ongoing. Note there is a component of Cheyne-Stokes breathing 06/15/2019 change to #6 distal XLT cuffed trach 06/15/2019 placed back on full ventilatory support Vent bundle    Pneumomediastinum, pneumothoraces CT scan performed on 06/15/2019 reveals extensive pneumomediastinum and subcutaneous emphysema in the lower neck.  There is less than 5% bilateral pneumothoraces.  Extensive airspace disease right greater than left consistent with ARDS or infection No role for chest tubes at this time Continue to monitor   New fever 103.1 06/15/2019 blood cultures x2 sputum culture x1 Currently on vancomycin and cefepime narrow as tolerated    Encephalopathic 06/15/2018 1 repeat CT of the head was with no new acute injuries  Circulatory shock Underlying cardiomyopathy Continue current interventions Per heart failure team Noted being on bivalirudin drip  Chronic systolic CHF, non-ischemic Polymorphic VT.  Apical thrombus in left ventricle. HLD. Currently on ASA Lipitor and amiodarone   Acute toxic metabolic encephalopathy/Uremic encephalopathy Hyperactive delirium  06/16/2019 poorly responsive     Lt MCA CVA likely embolic. Left pontine CVA, ?new noted on 3/17 Hx of cocaine abuse, depression. Plan Continue supportive care   AKI from ATN in setting of hypoxia, cardiogenic shock. Off CVVH since 3/7 , tolerating IHD since 3/10 Hyperkalemia Plan Per renal   Fluid and electrolyte imbalance/acid-base imbalance: Hypokalemia, anion gap metabolic acidosis Plan >>>  Need  to be careful with KCl supplements  if not dialyzing     Anemia of critical illness no evidence of bleeding Recent Labs    06/15/19 1127 06/16/19 0637  HGB 10.9* 9.7*     Plan Transfuse per protocol    DM type II poorly controlled with hyperglycemia. CBG (last 3)  Recent Labs    06/15/19 2333 06/16/19 0338 06/16/19 0828  GLUCAP 235* 126* 107*   Plan Continue current Levemir and sliding scale insulin    App CCT 45 min  [name] Shana Younge ACNP Acute Care Nurse Practitioner [place] Please consult Amion 06/16/2019, 8:26 AM

## 2019-06-16 NOTE — Progress Notes (Signed)
Pharmacy Antibiotic Note  Carlos Brewer. is a 34 y.o. male admitted on 04/30/2019 with stroke.  Pharmacy has been consulted for Vancomycin/Cefepime dosing for fever. Patient has had a prolonged hospital stay. Patient was started on cefepime 1g IV q24h and vancomycin 1000mg  IV qTTS on HD. Pt is ESRD on HD was planned to have HD today but was started on CRRT due to hyperkalemia and hypotension. WBC 17.7. Tmax 102.8. Respiratory culture growing moderate GPC in clusters which have been reincubated and blood cultures are no growth to date. Of note, patient's MRSA PCR was positive.    Plan: Adjust vancomycin to 1250mg  IV q24h  Adjust cefepime to 2g IV q12h  Monitor plans for CRRT transition back to iHD  Height: 6\' 1"  (185.4 cm) Weight: 102.3 kg (225 lb 8.5 oz) IBW/kg (Calculated) : 79.9  Temp (24hrs), Avg:100.7 F (38.2 C), Min:98.9 F (37.2 C), Max:102.8 F (39.3 C)  Recent Labs  Lab 06/10/19 1157 06/10/19 1157 06/11/19 1448 06/12/19 0213 06/13/19 0841 06/14/19 0549 06/16/19 0637  WBC 8.9  --   --  10.2 10.3 11.1* 17.7*  CREATININE 3.42*   < > 2.09* 2.49* 3.58* 4.06* 6.11*   < > = values in this interval not displayed.    Estimated Creatinine Clearance: 21.2 mL/min (A) (by C-G formula based on SCr of 6.11 mg/dL (H)).    Allergies  Allergen Reactions  . Heparin Other (See Comments)    Heparin induced thrombocytopenia. 2/15 HIT OD 1.692. 2/16 SRA positive-90.     Cristela Felt, PharmD PGY1 Pharmacy Resident [employer]: [phone]

## 2019-06-16 NOTE — Progress Notes (Addendum)
ANTICOAGULATION CONSULT NOTE  Pharmacy Consult:  Bivalirudin  Indication: stroke 2/5, LV thrombus 2/8, HIT 2/15  Allergies  Allergen Reactions  . Heparin Other (See Comments)    Heparin induced thrombocytopenia. 2/15 HIT OD 1.692. 2/16 SRA positive-90.     Patient Measurements: Height: 6\' 1"  (185.4 cm) Weight: 223 lb 5.2 oz (101.3 kg) IBW/kg (Calculated) : 79.9   Vital Signs: Temp: 98.9 F (37.2 C) (04/01 0400) Temp Source: Axillary (04/01 0400) BP: 88/53 (04/01 0356) Pulse Rate: 111 (04/01 0356)  Labs: Recent Labs    06/13/19 0841 06/13/19 0841 06/14/19 0549 06/14/19 0841 06/14/19 2018 06/14/19 2336 06/15/19 0322 06/15/19 1127  HGB 11.0*   < > 10.9*  --   --   --   --  10.9*  HCT 36.3*  --  35.4*  --   --   --   --  32.0*  PLT 377  --  336  --   --   --   --   --   APTT 62*   < > 114*   < > 65* 58* 58*  --   LABPROT 30.4*  --  40.7*  --   --   --  24.6*  --   INR 2.9*  --  4.2*  --   --   --  2.2*  --   CREATININE 3.58*  --  4.06*  --   --   --   --   --    < > = values in this interval not displayed.    Estimated Creatinine Clearance: 31.8 mL/min (A) (by C-G formula based on SCr of 4.06 mg/dL (H)).  Assessment: 34 yr old male with history of ESRD on HD presented with left MCA infarct - received TPA and revascularization with IR on 2/5. On 2/8 found to have small LV apical thrombus on ECHO. Pharmacy consulted to dose IV heparin, which was switched to bivalirudin 2/15 for HIT. 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 SRA very positive at 88. Heparin allergy has appropriately been added to chart and updated with SRA results. Transitioned off bivalrudin on 3/14. Restarted bivalirudin 3/26.   Bivalirudin was held briefly yesterday for chest tube placement yesterday and resumed at a rate of 0.05mg /kg/hr which the patient was previously therapeutic on. aPTT after resuming bivalirudin yesterday was unable to be obtained due to hemolyzed  specimens. aPTT this morning of 72 is therapeutic on bivalirudin 0.05mg /kg/hr. Level drawn appropriately. Hgb 9.7. Plt 345. No reported bleeding per RN.   Of note, patient is starting CRRT today.   Goal of Therapy:  APTT 50-85 seconds Monitor platelets by anticoagulation protocol: Yes   Plan:  Continue bivalirudin infusion at 0.05 mg/kg/hr  Check aPTT at 1030 Monitor aPTT, CBC, and S/S of bleeding daily   Cristela Felt, PharmD PGY1 Pharmacy Resident [employer]: [phone]  Addendum: Confirmatory aPTT 65 on bivalirudin 0.05.mg/kg/hr is therapeutic. No reported bleeding. Continue bivalirudin 0.05mg /kg/hr. Check aPTT next tomorrow morning since two therapeutic aPTT

## 2019-06-16 NOTE — Progress Notes (Signed)
PT Cancellation Note  Patient Details Name: Carlos Brewer. MRN: [id number] DOB: 25-May-1985   Cancelled Treatment:    Reason Eval/Treat Not Completed: Medical issues which prohibited therapy. Per discussion with RN pt about to initiate CRRT and is unable to follow commands at this time. PT will follow up when pt is more medically appropriate to participate in PT intervention.   Zenaida Niece 06/16/2019, 10:52 AM

## 2019-06-16 NOTE — Progress Notes (Signed)
Dear Doctor: This patient has been identified as a candidate for a PICC or CVC for the following reason (s): has had multiple PIVs and is on critical medication (vanc/pressors), poor vasculature. If you agree, please write an order for the indicated device.   Thank you for supporting the early vascular access assessment program.

## 2019-06-16 NOTE — Progress Notes (Signed)
OT Cancellation Note  Patient Details Name: Carlos Brewer. MRN: [id number] DOB: [birth year]   Cancelled Treatment:    Reason Eval/Treat Not Completed: Medical issues which prohibited therapy. Per discussion with RN pt on CRRT and presenting with decreased following of commands at this time. Will return as schedule allows and pt medically ready. Thank you.  [place], OTR/L Acute Rehab Pager: [phone] Office: [phone] 06/16/2019, 12:10 PM

## 2019-06-16 NOTE — Progress Notes (Addendum)
Patient ID: Carlos Brewer., male   DOB: [birth year], 34 y.o.   MRN: [id number]   Pt was scheduled for possible G tube placement  In IR today HOLD now per MD  Will recheck with RN/MD tomorrow Can plan for G tube maybe Monday 4/5

## 2019-06-17 ENCOUNTER — Inpatient Hospital Stay (HOSPITAL_COMMUNITY): Payer: No Typology Code available for payment source

## 2019-06-17 DIAGNOSIS — J9601 Acute respiratory failure with hypoxia: Secondary | ICD-10-CM | POA: Diagnosis not present

## 2019-06-17 DIAGNOSIS — A419 Sepsis, unspecified organism: Secondary | ICD-10-CM | POA: Diagnosis not present

## 2019-06-17 DIAGNOSIS — N179 Acute kidney failure, unspecified: Secondary | ICD-10-CM | POA: Diagnosis not present

## 2019-06-17 DIAGNOSIS — Z930 Tracheostomy status: Secondary | ICD-10-CM | POA: Diagnosis not present

## 2019-06-17 LAB — GLUCOSE, CAPILLARY
Glucose-Capillary: 137 mg/dL — ABNORMAL HIGH (ref 70–99)
Glucose-Capillary: 79 mg/dL (ref 70–99)
Glucose-Capillary: 181 mg/dL — ABNORMAL HIGH (ref 70–99)
Glucose-Capillary: 173 mg/dL — ABNORMAL HIGH (ref 70–99)
Glucose-Capillary: 119 mg/dL — ABNORMAL HIGH (ref 70–99)
Glucose-Capillary: 146 mg/dL — ABNORMAL HIGH (ref 70–99)

## 2019-06-17 LAB — CULTURE, RESPIRATORY W GRAM STAIN
Culture: NORMAL
Special Requests: NORMAL

## 2019-06-17 LAB — RENAL FUNCTION PANEL
Albumin: 2.5 g/dL — ABNORMAL LOW (ref 3.5–5.0)
Albumin: 2.5 g/dL — ABNORMAL LOW (ref 3.5–5.0)
Anion gap: 23 — ABNORMAL HIGH (ref 5–15)
Anion gap: 16 — ABNORMAL HIGH (ref 5–15)
BUN: 56 mg/dL — ABNORMAL HIGH (ref 6–20)
BUN: 44 mg/dL — ABNORMAL HIGH (ref 6–20)
CO2: 17 mmol/L — ABNORMAL LOW (ref 22–32)
CO2: 24 mmol/L (ref 22–32)
Calcium: 9 mg/dL (ref 8.9–10.3)
Calcium: 8.8 mg/dL — ABNORMAL LOW (ref 8.9–10.3)
Chloride: 99 mmol/L (ref 98–111)
Chloride: 97 mmol/L — ABNORMAL LOW (ref 98–111)
Creatinine, Ser: 2.88 mg/dL — ABNORMAL HIGH (ref 0.61–1.24)
Creatinine, Ser: 2.31 mg/dL — ABNORMAL HIGH (ref 0.61–1.24)
GFR calc Af Amer: 31 mL/min — ABNORMAL LOW (ref >60)
GFR calc Af Amer: 41 mL/min — ABNORMAL LOW (ref >60)
GFR calc non Af Amer: 27 mL/min — ABNORMAL LOW (ref >60)
GFR calc non Af Amer: 35 mL/min — ABNORMAL LOW (ref >60)
Glucose, Bld: 76 mg/dL (ref 70–99)
Glucose, Bld: 200 mg/dL — ABNORMAL HIGH (ref 70–99)
Phosphorus: 5.2 mg/dL — ABNORMAL HIGH (ref 2.5–4.6)
Phosphorus: 3.1 mg/dL (ref 2.5–4.6)
Potassium: 5.6 mmol/L — ABNORMAL HIGH (ref 3.5–5.1)
Potassium: 3.7 mmol/L (ref 3.5–5.1)
Sodium: 139 mmol/L (ref 135–145)
Sodium: 137 mmol/L (ref 135–145)

## 2019-06-17 LAB — PROTIME-INR
INR: 2.2 — ABNORMAL HIGH (ref 0.8–1.2)
Prothrombin Time: 24.7 seconds — ABNORMAL HIGH (ref 11.4–15.2)

## 2019-06-17 LAB — CBC WITH DIFFERENTIAL/PLATELET
Abs Immature Granulocytes: 0.05 10*3/uL (ref 0.00–0.07)
Basophils Absolute: 0.1 10*3/uL (ref 0.0–0.1)
Basophils Relative: 0 %
Eosinophils Absolute: 0.2 10*3/uL (ref 0.0–0.5)
Eosinophils Relative: 1 %
HCT: 33.7 % — ABNORMAL LOW (ref 39.0–52.0)
Hemoglobin: 10.4 g/dL — ABNORMAL LOW (ref 13.0–17.0)
Immature Granulocytes: 0 %
Lymphocytes Relative: 7 %
Lymphs Abs: 0.8 10*3/uL (ref 0.7–4.0)
MCH: 32.7 pg (ref 26.0–34.0)
MCHC: 30.9 g/dL (ref 30.0–36.0)
MCV: 106 fL — ABNORMAL HIGH (ref 80.0–100.0)
Monocytes Absolute: 0.8 10*3/uL (ref 0.1–1.0)
Monocytes Relative: 6 %
Neutro Abs: 11.1 10*3/uL — ABNORMAL HIGH (ref 1.7–7.7)
Neutrophils Relative %: 86 %
Platelets: 332 10*3/uL (ref 150–400)
RBC: 3.18 MIL/uL — ABNORMAL LOW (ref 4.22–5.81)
RDW: 18 % — ABNORMAL HIGH (ref 11.5–15.5)
WBC: 13 10*3/uL — ABNORMAL HIGH (ref 4.0–10.5)
nRBC: 0.2 % (ref 0.0–0.2)

## 2019-06-17 LAB — MAGNESIUM: Magnesium: 2.6 mg/dL — ABNORMAL HIGH (ref 1.7–2.4)

## 2019-06-17 LAB — APTT: aPTT: 78 seconds — ABNORMAL HIGH (ref 24–36)

## 2019-06-17 MED ORDER — NOREPINEPHRINE 4 MG/250ML-% IV SOLN
2.0000 ug/min | INTRAVENOUS | Status: DC
  Administered 2019-06-17 (×2): 10 ug/min via INTRAVENOUS
  Administered 2019-06-18: 9 ug/min via INTRAVENOUS
  Administered 2019-06-18 – 2019-06-21 (×10): 10 ug/min via INTRAVENOUS
  Administered 2019-06-21: 23:00:00 6 ug/min via INTRAVENOUS
  Administered 2019-06-21: 12:00:00 5.333 ug/min via INTRAVENOUS
  Filled 2019-06-17 (×15): qty 250

## 2019-06-17 MED ORDER — SODIUM CHLORIDE 0.9 % IV SOLN: 250.0000 mL | INTRAVENOUS | Status: DC

## 2019-06-17 MED ORDER — PRISMASOL BGK 4/2.5 32-4-2.5 MEQ/L REPLACEMENT SOLN
Status: DC
  Filled 2019-06-17 (×8): qty 5000

## 2019-06-17 MED ORDER — PRISMASOL BGK 4/2.5 32-4-2.5 MEQ/L IV SOLN
INTRAVENOUS | Status: DC
  Filled 2019-06-17 (×34): qty 5000

## 2019-06-17 NOTE — Progress Notes (Signed)
IR requested by Dr. Avon Gully for possible image-guided percutaneous gastrostomy tube placement.  CT abdomen reviewed by Dr. Vernard Gambles who approves procedure. Patient has been seen/consented for procedure. Patient currently on CRRT at this time. Plan for possible image-guided percutaneous gastrostomy tube placement in IR tentatively for Monday 06/20/2019- hopefully patient's renal function will improve and he will not be requiring CRRT at that time. Last dose Coumadin 06/11/2019- please continue to hold this until post-procedure. Patient will be NPO (including all tube feedings) on 06/20/2019 at 0001. Katie, RN aware of plan.  Please call IR with questions/concerns.   Bea Graff Azra Abrell, PA-C 06/17/2019, 11:31 AM

## 2019-06-17 NOTE — Progress Notes (Signed)
ANTICOAGULATION CONSULT NOTE  Pharmacy Consult:  Bivalirudin  Indication: stroke 2/5, LV thrombus 2/8, HIT 2/15  Allergies  Allergen Reactions  . Heparin Other (See Comments)    Heparin induced thrombocytopenia. 2/15 HIT OD 1.692. 2/16 SRA positive-90.     Patient Measurements: Height: 6\' 1"  (185.4 cm) Weight: 102.9 kg (226 lb 13.7 oz) IBW/kg (Calculated) : 79.9   Vital Signs: Temp: 97.9 F (36.6 C) (04/02 0800) Temp Source: Axillary (04/02 0800) BP: 93/72 (04/02 0900) Pulse Rate: 162 (04/02 0800)  Labs: Recent Labs    06/15/19 0322 06/15/19 0322 06/15/19 1127 06/16/19 0637 06/16/19 1110 06/16/19 1846 06/17/19 0815 06/17/19 0847  HGB  --   --  10.9* 9.7*  --   --   --   --   HCT  --   --  32.0* 33.0*  --   --   --   --   PLT  --   --   --  345  --   --   --   --   APTT 58*   < >  --  72* 65*  --   --  78*  LABPROT 24.6*  --   --  27.3*  --   --   --  24.7*  INR 2.2*  --   --  2.6*  --   --   --  2.2*  CREATININE  --   --   --  6.11*  --  4.38* 2.88*  --    < > = values in this interval not displayed.    Estimated Creatinine Clearance: 45.1 mL/min (A) (by C-G formula based on SCr of 2.88 mg/dL (H)).  Assessment: 34 yr old male with history of ESRD on HD presented with left MCA infarct - received TPA and revascularization with IR on 2/5. On 2/8 found to have small LV apical thrombus on ECHO. Pharmacy consulted to dose IV heparin, which was switched to bivalirudin 2/15 for HIT. 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 SRA very positive at 49. Heparin allergy has appropriately been added to chart and updated with SRA results. Transitioned off bivalrudin on 3/14. Restarted bivalirudin 3/26 and briefly held on 3/31 for chest tube placement.   aPTT this morning of 78 is therapeutic on bivalirudin 0.05mg /kg/hr. Level drawn appropriately. Yesterday Hgb 9.7 and Plt 345. No reported bleeding per RN. Of note, patient is also receiving CRRT.    Goal of Therapy:  APTT 50-85 seconds Monitor platelets by anticoagulation protocol: Yes   Plan:  Continue bivalirudin infusion at 0.05 mg/kg/hr  Monitor aPTT, CBC, and S/S of bleeding daily   Cristela Felt, PharmD PGY1 Pharmacy Resident [employer]: [phone]

## 2019-06-17 NOTE — Progress Notes (Signed)
PT Cancellation Note  Patient Details Name: Tamim Skog. MRN: [id number] DOB: Mar 29, 1985   Cancelled Treatment:    Reason Eval/Treat Not Completed: Medical issues which prohibited therapy.  Per RN, pt opens eyes, but does not make eye contact or track, responds weakly to painful stimuli, does not follow any commands.  This is different than his previous level.  PT will follow along, may attempt more stimulation on Monday to see if he can respond.    Thanks,  Verdene Lennert, PT, DPT  Acute Rehabilitation [phone] pager #[phone] office       Wells Guiles B Orly Quimby 06/17/2019, 11:06 AM

## 2019-06-17 NOTE — Progress Notes (Signed)
[facility] ROUNDING NOTE   Subjective:   Brief history this is a 34 year old gentleman with a history of systolic heart failure myocarditis tobacco abuse cocaine abuse nonischemic cardiomyopathy.  He had presented to the emergency room 05/08/2019 with a left MCA CVA multiple small embolic infarcts status post TPA thrombectomy in IR.  His hospital course been complicated by septic shock hospital-acquired pneumonia acute kidney injury requiring CRRT and ultimately dialysis polymorphic ventricular tachycardia and cardiogenic shock/septic shock requiring intermittent pressor dependence.  Due to difficulty with vent wean he has required tracheostomy 05/05/2019.  The plan is for placement of PEG tube and LTAC. Marland Kitchen  Underwent bronchoscopy 06/13/2019.  Has developed fever chills and greenish mucus from trach site.  Underwent dialysis 06/14/2019 with removal of 3 L.  Patient appears to have deteriorated 06/15/2019 and transferred to intensive care unit.  He is now hypotensive.  He is back on the ventilator.  He is back on CRRT started for 06/16/2019  Blood pressure 122/94 pulse 89 temperature 97.5 O2 sats 100% FiO2 30%  Sodium 141 potassium 5.4 chloride 99 CO2 20 BUN 494 creatinine 4.38 glucose 174 calcium 92  IV norepinephrine  Amiodarone 200 mg twice daily, Abilify 10 mg daily aspirin 81 mg daily atorvastatin 40 mg daily darbepoetin 150 mcg q. Monday Prozac 20 mg daily, insulin 7 units every 4 hours, insulin Levemir 40 units twice daily midodrine 5 mg every 8 hours Protonix 40 mg daily Renvela 2.4 g 3 times daily Coumadin dosing per pharmacy, Angiomax  IV vancomycin IV cefepime 06/15/2019  Last chest x-ray interval advancement of feeding tube otherwise stable chest with diffuse bilateral airspace opacities consistent with congestive heart failure 06/13/2019 Abdominal x-ray no enteric catheter identified  CT scan of head expected interval evolution of subacute and chronic infarctions of the frontal  lobes right cerebellar hemisphere and pons.  No acute intracranial pathology noted 06/15/2019  CT scan of chest showed extensive pneumomediastinum with subcutaneous emphysema in lower neck less than 5% bilateral pneumothoraces and extensive hydrogenous consolidation airspace disease worse right consistent with multifocal infection edema ARDS tracheostomy.  Objective:  Vital signs in last 24 hours:  Temp:  [95.1 F (35.1 C)-98.9 F (37.2 C)] 97.5 F (36.4 C) (04/02 0400) Pulse Rate:  [53-118] 53 (04/02 0500) Resp:  [15-35] 20 (04/02 0745) BP: (46-119)/(35-99) 95/75 (04/02 0745) SpO2:  [79 %-100 %] 98 % (04/02 0730) FiO2 (%):  [30 %] 30 % (04/02 0730) Weight:  [102.3 kg-102.9 kg] 102.9 kg (04/02 0500)  Weight change:  Filed Weights   06/14/19 1233 06/16/19 0900 06/17/19 0500  Weight: 101.3 kg 102.3 kg 102.9 kg    Intake/Output: I/O last 3 completed shifts: In: 5441.4 [I.V.:1163.6; NG/GT:3728; IV Piggyback:549.8] Out: 3022 [Urine:350; Other:2672]   Intake/Output this shift:  Total I/O In: -  Out: 76 [Other:76]  Gen: Appears dyspneic this morning using trach collar greenish mucus around trach site CVS: Pulse regular tachycardia, S1 and S2 normal.  Right IJ TDC Resp: Coarse/transmitted breath sounds bilaterally, no rales Abd: Soft, obese, nontender Ext: No lower extremity edema   Basic Metabolic Panel: Recent Labs  Lab 06/12/19 0213 06/12/19 0213 06/13/19 0841 06/13/19 0841 06/14/19 0549 06/15/19 1127 06/16/19 0637 06/16/19 1846  NA 136   < > 139  --  142 139 144 141  K 4.1   < > 4.6  --  4.8 5.6* 7.0* 5.4*  CL 96*  --  100  --  105  --  101 99  CO2 22  --  20*  --  19*  --  18* 20*  GLUCOSE 83  --  136*  --  215*  --  125* 174*  BUN 35*  --  56*  --  85*  --  124* 94*  CREATININE 2.49*  --  3.58*  --  4.06*  --  6.11* 4.38*  CALCIUM 9.6   < > 10.0   < > 9.5  --  9.7 9.2  MG  --   --   --   --   --   --  2.6*  --   PHOS  --   --   --   --   --   --  9.4*  --     < > = values in this interval not displayed.    Liver Function Tests: Recent Labs  Lab 06/10/19 1157  AST 35  ALT 29  ALKPHOS 162*  BILITOT 1.1  PROT 7.0  ALBUMIN 2.7*   No results for input(s): LIPASE, AMYLASE in the last 168 hours. No results for input(s): AMMONIA in the last 168 hours.  CBC: Recent Labs  Lab 06/10/19 1157 06/10/19 1157 06/12/19 0213 06/13/19 0841 06/14/19 0549 06/15/19 1127 06/16/19 0637  WBC 8.9  --  10.2 10.3 11.1*  --  17.7*  HGB 9.8*   < > 10.2* 11.0* 10.9* 10.9* 9.7*  HCT 31.9*   < > 33.5* 36.3* 35.4* 32.0* 33.0*  MCV 103.6*  --  104.0* 104.3* 102.6*  --  105.4*  PLT 323  --  366 377 336  --  345   < > = values in this interval not displayed.    Cardiac Enzymes: No results for input(s): CKTOTAL, CKMB, CKMBINDEX, TROPONINI in the last 168 hours.  BNP: Invalid input(s): POCBNP  CBG: Recent Labs  Lab 06/16/19 1620 06/16/19 1927 06/16/19 2305 06/17/19 0321 06/17/19 0813  GLUCAP 150* 189* 166* 137* 46    Microbiology: Results for orders placed or performed during the hospital encounter of 05/07/2019  Respiratory Panel by RT PCR (Flu A&B, Covid) - Nasopharyngeal Swab     Status: None   Collection Time: 05/08/2019  4:42 AM   Specimen: Nasopharyngeal Swab  Result Value Ref Range Status   SARS Coronavirus 2 by RT PCR NEGATIVE NEGATIVE Final    Comment: (NOTE) SARS-CoV-2 target nucleic acids are NOT DETECTED. The SARS-CoV-2 RNA is generally detectable in upper respiratoy specimens during the acute phase of infection. The lowest concentration of SARS-CoV-2 viral copies this assay can detect is 131 copies/mL. A negative result does not preclude SARS-Cov-2 infection and should not be used as the sole basis for treatment or other patient management decisions. A negative result may occur with  improper specimen collection/handling, submission of specimen other than nasopharyngeal swab, presence of viral mutation(s) within the areas targeted  by this assay, and inadequate number of viral copies (<131 copies/mL). A negative result must be combined with clinical observations, patient history, and epidemiological information. The expected result is Negative. Fact Sheet for Patients:  PinkCheek.be Fact Sheet for Healthcare Providers:  GravelBags.it This test is not yet ap proved or cleared by the [place] FDA and  has been authorized for detection and/or diagnosis of SARS-CoV-2 by FDA under an Emergency Use Authorization (EUA). This EUA will remain  in effect (meaning this test can be used) for the duration of the COVID-19 declaration under Section 564(b)(1) of the Act, 21 U.S.C. section 360bbb-3(b)(1), unless the authorization is terminated or revoked sooner.  Influenza A by PCR NEGATIVE NEGATIVE Final   Influenza B by PCR NEGATIVE NEGATIVE Final    Comment: (NOTE) The Xpert Xpress SARS-CoV-2/FLU/RSV assay is intended as an aid in  the diagnosis of influenza from Nasopharyngeal swab specimens and  should not be used as a sole basis for treatment. Nasal washings and  aspirates are unacceptable for Xpert Xpress SARS-CoV-2/FLU/RSV  testing. Fact Sheet for Patients: PinkCheek.be Fact Sheet for Healthcare Providers: GravelBags.it This test is not yet approved or cleared by the [place] FDA and  has been authorized for detection and/or diagnosis of SARS-CoV-2 by  FDA under an Emergency Use Authorization (EUA). This EUA will remain  in effect (meaning this test can be used) for the duration of the  Covid-19 declaration under Section 564(b)(1) of the Act, 21  U.S.C. section 360bbb-3(b)(1), unless the authorization is  terminated or revoked. Performed at [facility], [place] [address], [place], [place] [address]   MRSA PCR Screening     Status: Abnormal   Collection Time: 05/10/2019  1:56 PM    Specimen: Nasal Mucosa; Nasopharyngeal  Result Value Ref Range Status   MRSA by PCR POSITIVE (A) NEGATIVE Final    Comment:        The GeneXpert MRSA Assay (FDA approved for NASAL specimens only), is one component of a comprehensive MRSA colonization surveillance program. It is not intended to diagnose MRSA infection nor to guide or monitor treatment for MRSA infections. RESULT CALLED TO, READ BACK BY AND VERIFIED WITH: Adrienne Mocha RN 16:20 04/21/2019 ([email or web address]) Performed at [facility], [place] [address] [place], [place] [address]   Culture, respiratory (non-expectorated)     Status: None   Collection Time: 04/23/19 10:58 AM   Specimen: Tracheal Aspirate; Respiratory  Result Value Ref Range Status   Specimen Description TRACHEAL ASPIRATE  Final   Special Requests NONE  Final   Gram Stain   Final    FEW WBC PRESENT, PREDOMINANTLY PMN FEW GRAM NEGATIVE COCCOBACILLI FEW GRAM POSITIVE COCCI IN PAIRS IN CLUSTERS RARE GRAM POSITIVE RODS    Culture   Final    ABUNDANT Consistent with normal respiratory flora. Performed at [facility], [place] [address] [place] [address], [place], [place] [address]    Report Status 04/25/2019 FINAL  Final  Culture, blood (Routine X 2) w Reflex to ID Panel     Status: None   Collection Time: 04/23/19 11:20 AM   Specimen: BLOOD  Result Value Ref Range Status   Specimen Description BLOOD RIGHT ANTECUBITAL  Final   Special Requests   Final    AEROBIC BOTTLE ONLY Blood Culture results may not be optimal due to an inadequate volume of blood received in culture bottles   Culture   Final    NO GROWTH 5 DAYS Performed at [facility], [place] [address] [place], [place] [address]    Report Status 04/28/2019 FINAL  Final  Culture, blood (Routine X 2) w Reflex to ID Panel     Status: None   Collection Time: 04/23/19 11:21 AM   Specimen: BLOOD  Result Value Ref Range Status   Specimen Description BLOOD RIGHT ANTECUBITAL  Final   Special Requests   Final     AEROBIC BOTTLE ONLY Blood Culture results may not be optimal due to an inadequate volume of blood received in culture bottles   Culture   Final    NO GROWTH 5 DAYS Performed at [facility], [place]  [address] [place], [place] [address]    Report Status 04/28/2019 FINAL  Final  Culture, Urine     Status: None   Collection Time: 04/23/19  1:56 PM   Specimen: Urine, Random  Result Value Ref Range Status   Specimen Description URINE, RANDOM  Final   Special Requests   Final    NONE Performed at [facility], [place] [address] [place], [place] [address]    Culture NO GROWTH  Final   Report Status 04/25/2019 FINAL  Final  Culture, respiratory (non-expectorated)     Status: None   Collection Time: 05/03/19 11:23 AM   Specimen: Tracheal Aspirate; Respiratory  Result Value Ref Range Status   Specimen Description TRACHEAL ASPIRATE  Final   Special Requests NONE  Final   Gram Stain   Final    MODERATE WBC PRESENT, PREDOMINANTLY PMN RARE GRAM POSITIVE COCCI    Culture   Final    Consistent with normal respiratory flora. Performed at [facility], [place] [address] [place], [place] [address]    Report Status 05/05/2019 FINAL  Final  Culture, blood (Routine X 2) w Reflex to ID Panel     Status: None   Collection Time: 05/06/19 11:45 AM   Specimen: BLOOD LEFT ARM  Result Value Ref Range Status   Specimen Description BLOOD LEFT ARM  Final   Special Requests   Final    BOTTLES DRAWN AEROBIC ONLY Blood Culture adequate volume   Culture   Final    NO GROWTH 5 DAYS Performed at [facility], [place] [address], [place], [place] [address]    Report Status 05/11/2019 FINAL  Final  Culture, blood (Routine X 2) w Reflex to ID Panel     Status: None   Collection Time: 05/06/19 12:20 PM   Specimen: BLOOD RIGHT ARM  Result Value Ref Range Status   Specimen Description BLOOD RIGHT ARM  Final   Special Requests   Final    BOTTLES DRAWN AEROBIC ONLY Blood Culture results may not be  optimal due to an inadequate volume of blood received in culture bottles   Culture   Final    NO GROWTH 5 DAYS Performed at [facility], [place] [address], [place], [place] [address]    Report Status 05/11/2019 FINAL  Final  C difficile quick scan w PCR reflex     Status: None   Collection Time: 05/12/19 10:26 AM   Specimen: STOOL  Result Value Ref Range Status   C Diff antigen NEGATIVE NEGATIVE Final   C Diff toxin NEGATIVE NEGATIVE Final   C Diff interpretation No C. difficile detected.  Final    Comment: Performed at [facility], [place] [address] [place],  [address]  Culture, blood (routine x 2)     Status: None   Collection Time: 05/12/19  9:07 PM   Specimen: BLOOD LEFT HAND  Result Value Ref Range Status   Specimen Description BLOOD LEFT HAND  Final   Special Requests   Final    Blood Culture results may not be optimal due to an inadequate volume of blood received in culture bottles   Culture   Final    NO GROWTH 5 DAYS Performed at [facility], [place] [address] [place],  [address]    Report Status 05/17/2019 FINAL  Final  Culture, blood (routine x 2)     Status: None   Collection Time: 05/12/19  9:21 PM  Specimen: BLOOD RIGHT HAND  Result Value Ref Range Status   Specimen Description BLOOD RIGHT HAND  Final   Special Requests   Final    BOTTLES DRAWN AEROBIC ONLY Blood Culture results may not be optimal due to an inadequate volume of blood received in culture bottles   Culture   Final    NO GROWTH 5 DAYS Performed at [facility], [place] [address], [place], [place] [address]    Report Status 05/17/2019 FINAL  Final  Culture, respiratory (non-expectorated)     Status: None   Collection Time: 05/20/19  8:06 PM   Specimen: Tracheal Aspirate; Respiratory  Result Value Ref Range Status   Specimen Description TRACHEAL ASPIRATE  Final   Special Requests NONE  Final   Gram Stain   Final    ABUNDANT WBC PRESENT, PREDOMINANTLY PMN NO  ORGANISMS SEEN    Culture   Final    NO GROWTH 2 DAYS Performed at [facility], [address] [place], [place] [address]    Report Status 05/23/2019 FINAL  Final  MRSA PCR Screening     Status: Abnormal   Collection Time: 05/25/19 11:06 AM   Specimen: Nasal Mucosa; Nasopharyngeal  Result Value Ref Range Status   MRSA by PCR POSITIVE (A) NEGATIVE Final    Comment:        The GeneXpert MRSA Assay (FDA approved for NASAL specimens only), is one component of a comprehensive MRSA colonization surveillance program. It is not intended to diagnose MRSA infection nor to guide or monitor treatment for MRSA infections. RESULT CALLED TO, READ BACK BY AND VERIFIED WITH: Silver Huguenin RN 13:15 05/25/19 ([email or web address]) Performed at [facility], [place] [address], [place], [place] [address]   Culture, blood (routine x 2)     Status: Abnormal   Collection Time: 05/26/19 10:45 AM   Specimen: BLOOD RIGHT HAND  Result Value Ref Range Status   Specimen Description BLOOD RIGHT HAND  Final   Special Requests AEROBIC BOTTLE ONLY Blood Culture adequate volume  Final   Culture  Setup Time   Final    GRAM POSITIVE COCCI AEROBIC BOTTLE ONLY CRITICAL RESULT CALLED TO, READ BACK BY AND VERIFIED WITH: PHARMD J STEENWYK [id number] MLM    Culture (A)  Final    STAPHYLOCOCCUS SPECIES (COAGULASE NEGATIVE) THE SIGNIFICANCE OF ISOLATING THIS ORGANISM FROM A SINGLE SET OF BLOOD CULTURES WHEN MULTIPLE SETS ARE DRAWN IS UNCERTAIN. PLEASE NOTIFY THE MICROBIOLOGY DEPARTMENT WITHIN ONE WEEK IF SPECIATION AND SENSITIVITIES ARE REQUIRED. Performed at [facility], [place] [address] [place], [place] [address]    Report Status 05/28/2019 FINAL  Final  Culture, blood (routine x 2)     Status: None   Collection Time: 05/26/19 10:50 AM   Specimen: BLOOD LEFT HAND  Result Value Ref Range Status   Specimen Description BLOOD LEFT HAND  Final   Special Requests   Final    AEROBIC BOTTLE ONLY Blood Culture results  may not be optimal due to an inadequate volume of blood received in culture bottles   Culture   Final    NO GROWTH 5 DAYS Performed at [facility], [place] [address], [place],  [address]    Report Status 05/31/2019 FINAL  Final  Culture, respiratory     Status: None   Collection Time: 05/26/19 11:52 AM   Specimen: Tracheal Aspirate; Respiratory  Result Value Ref Range Status   Specimen Description TRACHEAL ASPIRATE  Final   Special Requests Normal  Final   Gram Stain   Final    MODERATE WBC PRESENT, PREDOMINANTLY PMN NO ORGANISMS SEEN Performed at [facility], [place] [address], [place], [place] [address]    Culture FEW CANDIDA ALBICANS  Final   Report Status 05/29/2019 FINAL  Final  Culture, blood (routine x 2)     Status: None   Collection Time: 05/27/19  4:48 PM   Specimen: BLOOD  Result Value Ref Range Status   Specimen Description BLOOD RIGHT ANTECUBITAL  Final   Special Requests AEROBIC BOTTLE ONLY Blood Culture adequate volume  Final   Culture   Final    NO GROWTH 5 DAYS Performed at [place] [address] [place], [place] [address]    Report Status 06/01/2019 FINAL  Final  Culture, blood (routine x 2)     Status: None   Collection Time: 05/27/19  4:53 PM   Specimen: BLOOD RIGHT HAND  Result Value Ref Range Status   Specimen Description BLOOD RIGHT HAND  Final   Special Requests   Final    AEROBIC BOTTLE ONLY Blood Culture results may not be optimal due to an inadequate volume of blood received in culture bottles   Culture   Final    NO GROWTH 5 DAYS Performed at [facility], [place] [address], [place], [place] [address]    Report Status 06/01/2019 FINAL  Final  Culture, blood (routine x 2)     Status: None   Collection Time: 06/01/19  3:50 AM   Specimen: BLOOD  Result Value Ref Range Status   Specimen Description BLOOD A-LINE  Final   Special Requests   Final    BOTTLES DRAWN AEROBIC AND ANAEROBIC Blood Culture adequate volume    Culture   Final    NO GROWTH 5 DAYS Performed at [facility], [place] [address], [place], [place] [address]    Report Status 06/06/2019 FINAL  Final  Culture, blood (Routine X 2) w Reflex to ID Panel     Status: None   Collection Time: 06/01/19  4:51 AM   Specimen: BLOOD RIGHT HAND  Result Value Ref Range Status   Specimen Description BLOOD RIGHT HAND  Final   Special Requests   Final    BOTTLES DRAWN AEROBIC ONLY Blood Culture adequate volume   Culture   Final    NO GROWTH 5 DAYS Performed at [facility], [place] [address], [place], [place] [address]    Report Status 06/06/2019 FINAL  Final  Culture, respiratory (non-expectorated)     Status: None   Collection Time: 06/08/19  8:08 AM   Specimen: Tracheal Aspirate; Respiratory  Result Value Ref Range Status   Specimen Description TRACHEAL ASPIRATE  Final   Special Requests Normal  Final   Gram Stain NO WBC SEEN NO ORGANISMS SEEN   Final   Culture   Final    FEW Consistent with normal respiratory flora. Performed at [facility], [place] [address], [place],  [address]    Report Status 06/10/2019 FINAL  Final  Culture, respiratory (non-expectorated)     Status: None (Preliminary result)   Collection Time: 06/15/19  4:47 AM   Specimen: Tracheal Aspirate; Respiratory  Result Value Ref Range Status   Specimen Description TRACHEAL ASPIRATE  Final   Special Requests Normal  Final   Gram Stain   Final    FEW WBC PRESENT, PREDOMINANTLY PMN MODERATE  GRAM POSITIVE COCCI IN PAIRS IN CLUSTERS    Culture   Final    CULTURE REINCUBATED FOR BETTER GROWTH Performed at [facility], [place] [address], [place], [place] [address]    Report Status PENDING  Incomplete  Culture, blood (Routine X 2) w Reflex to ID Panel     Status: None (Preliminary result)   Collection Time: 06/15/19  5:41 AM   Specimen: BLOOD  Result Value Ref Range Status   Specimen Description BLOOD RIGHT ARM  Final   Special Requests   Final     BOTTLES DRAWN AEROBIC AND ANAEROBIC Blood Culture adequate volume   Culture   Final    NO GROWTH 1 DAY Performed at [facility], [place] [address], [place], [place] [address]    Report Status PENDING  Incomplete  Culture, blood (Routine X 2) w Reflex to ID Panel     Status: None (Preliminary result)   Collection Time: 06/15/19  5:46 AM   Specimen: BLOOD  Result Value Ref Range Status   Specimen Description BLOOD RIGHT ARM  Final   Special Requests   Final    BOTTLES DRAWN AEROBIC AND ANAEROBIC Blood Culture adequate volume   Culture   Final    NO GROWTH 1 DAY Performed at [facility], [place] [address] [place], [place] [address]    Report Status PENDING  Incomplete    Coagulation Studies: Recent Labs    06/15/19 0322 06/16/19 0637  LABPROT 24.6* 27.3*  INR 2.2* 2.6*    Urinalysis: No results for input(s): COLORURINE, LABSPEC, PHURINE, GLUCOSEU, HGBUR, BILIRUBINUR, KETONESUR, PROTEINUR, UROBILINOGEN, NITRITE, LEUKOCYTESUR in the last 72 hours.  Invalid input(s): APPERANCEUR    Imaging: CT HEAD WO CONTRAST  Result Date: 06/15/2019 CLINICAL DATA:  Encephalopathy previous infarction EXAM: CT HEAD WITHOUT CONTRAST TECHNIQUE: Contiguous axial images were obtained from the base of the skull through the vertex without intravenous contrast. COMPARISON:  CT brain, 06/01/2019, MR brain, 04/28/2019 FINDINGS: Brain: Interval evolution of subacute to chronic bilateral frontal lobe infarctions primarily involving the operculum (series 3, image 21) as well as a large infarction of the right cerebellar hemisphere (series 3, image 11), seen acutely on prior MR dated 04/28/2019. There has also been interval evolution of infarction of the central and left pons (series 3, image 12). No new infarction or acute intracranial finding appreciated. Vascular: No hyperdense vessel or unexpected calcification. Skull: Normal. Negative for fracture or focal lesion. Sinuses/Orbits: No acute finding.  Other: None. IMPRESSION: Expected interval evolution of subacute to chronic infarctions of the frontal lobes, right cerebellar hemisphere, and pons. No noncontrast CT evidence of new infarction or other acute intracranial pathology. Electronically Signed   By: Eddie Candle M.D.   On: 06/15/2019 11:38   CT CHEST WO CONTRAST  Result Date: 06/15/2019 CLINICAL DATA:  Pneumothorax, pneumomediastinum, abnormal chest radiograph EXAM: CT CHEST WITHOUT CONTRAST TECHNIQUE: Multidetector CT imaging of the chest was performed following the standard protocol without IV contrast. COMPARISON:  Chest radiograph, 06/15/2019, 10:23 a.m. FINDINGS: Cardiovascular: Large-bore right neck multi lumen vascular catheter, tip near the superior cavoatrial junction. Cardiomegaly. No pericardial effusion. Mediastinum/Nodes: No enlarged mediastinal, hilar, or axillary lymph nodes. Extensive pneumomediastinum and subcutaneous emphysema in the lower neck. Thyroid gland, trachea, and esophagus demonstrate no significant findings. Lungs/Pleura: Tracheostomy. Extensive heterogeneous and consolidative airspace disease, worse in the right lung. Minimal, less than 5% bilateral pneumothoraces (series 3, image 79). Upper Abdomen: No acute abnormality. Musculoskeletal: No chest wall mass  or suspicious bone lesions identified. IMPRESSION: 1. Extensive pneumomediastinum and subcutaneous emphysema in the lower neck. 2.  Minimal, less than 5% bilateral pneumothoraces. 3. Extensive heterogeneous and consolidative airspace disease, worse in the right lung, consistent with multifocal infection, edema, and/or ARDS. 4.  Tracheostomy. 5.  Cardiomegaly. Electronically Signed   By: Eddie Candle M.D.   On: 06/15/2019 11:43   DG Chest Port 1 View  Result Date: 06/17/2019 CLINICAL DATA:  34 year old male with history of respiratory failure. EXAM: PORTABLE CHEST 1 VIEW COMPARISON:  Chest x-ray 06/16/2019. FINDINGS: A tracheostomy tube is in place with tip 4.0 cm  above the carina. There is a right-sided internal jugular central venous catheter with tip terminating in the distal superior vena cava. Lung volumes are slightly low. Bibasilar opacities (left greater than right), which may reflect areas of atelectasis and/or consolidation. No definite pleural effusions. Moderate cardiomegaly. Lucencies adjacent to the heart and mediastinal contours, compatible with residual pneumomediastinum. No evidence of pulmonary edema. Small amount of gas in the cervical soft tissues. IMPRESSION: 1. Support apparatus, as above. 2. Persistent bibasilar areas of atelectasis and/or consolidation. 3. Moderate cardiomegaly. 4. Persistent pneumomediastinum. Electronically Signed   By: Vinnie Langton M.D.   On: 06/17/2019 06:40   DG Chest Port 1 View  Result Date: 06/16/2019 CLINICAL DATA:  Respiratory failure. EXAM: PORTABLE CHEST 1 VIEW COMPARISON:  CT 06/15/2019.  Chest x-ray 06/15/2019. FINDINGS: Tracheostomy tube, feeding tube, right central line in unchanged position. Pneumomediastinum again noted. Persistent very tiny biapical pneumothoraces cannot be completely excluded. Persistent right side greater than left bilateral pulmonary infiltrates/edema again noted. Interim improvement of aeration in the right lung from prior exam. No prominent pleural effusion. Stable cardiomegaly. Subcutaneous emphysema about the neck and supraclavicular regions again noted. IMPRESSION: 1. Tracheostomy tube, feeding tube, right central line in unchanged position. 2. Pneumomediastinum again noted. Persistent very tiny biapical pneumothoraces cannot be completely excluded. Subcutaneous emphysema about the neck and supraclavicular regions again noted. 3. Persistent right side greater than left bilateral pulmonary infiltrates/edema again noted. Interim improvement of aeration right lung from prior exam. 4.  Stable cardiomegaly. Electronically Signed   By: Marcello Moores  Register   On: 06/16/2019 06:27   DG Chest  Port 1 View  Addendum Date: 06/15/2019   ADDENDUM REPORT: 06/15/2019 11:07 ADDENDUM: These results were called by telephone at the time of interpretation on 06/15/2019 at 11:07 am to provider Gwyndolyn Saxon MINOR , who verbally acknowledged these results. Specifically the possibility that the right pneumothorax in particular may be larger than expected based on appearance and perhaps associated with more pronounced anterior component was discussed. CT may be helpful for further assessment. Electronically Signed   By: Zetta Bills M.D.   On: 06/15/2019 11:07   Result Date: 06/15/2019 CLINICAL DATA:  Tracheostomy tube change. EXAM: PORTABLE CHEST 1 VIEW COMPARISON:  06/14/2019 and 06/15/2019 FINDINGS: Right IJ dialysis catheter remains in place tip at the caval to atrial junction. Tracheostomy tube in place terminating, tip over mid trachea. Heart size remains enlarged with globular appearance. Diffuse airspace disease in the right chest as before with some medial lucency along the right heart border. Patchy airspace disease in the left chest similar to previous study. Discrete pneumothorax in the left chest is not visible on the current study, seen on the more recent prior. There is some lucency along the left heart border and at the left costodiaphragmatic sulcus. Subcutaneous emphysema in the right neck similar to prior study. No acute bone finding. IMPRESSION: 1. Tracheostomy  tube in place. 2. Persistent cardiomegaly with globular appearance of the heart. 3. Persistent diffuse airspace disease in the right chest greater than left. 4. Lucency along the left heart border and at the left costodiaphragmatic sulcus. This likely reflects pneumomediastinum that was seen on previous imaging. The pneumothorax with anterior component is also considered as the discrete pleural line seen at the lung apices are not seen on the current exam. Film may underestimate the size of pneumothoraces particularly in right chest though  there is no significant change from the recent comparison study. 5. A call is out to the referring provider to further discuss findings in the above case. Electronically Signed: By: Zetta Bills M.D. On: 06/15/2019 10:50     Medications:   . sodium chloride Stopped (06/17/19 0331)  . sodium chloride Stopped (05/21/19 0940)  . bivalirudin (ANGIOMAX) infusion 0.5 mg/mL (Non-ACS indications) 0.05 mg/kg/hr (06/17/19 0700)  . ceFEPime (MAXIPIME) IV Stopped (06/17/19 0207)  . feeding supplement (VITAL 1.5 CAL) 1,000 mL (06/17/19 0609)  . fentaNYL infusion INTRAVENOUS 50 mcg/hr (06/17/19 0700)  . norepinephrine (LEVOPHED) Adult infusion 12 mcg/min (06/17/19 0700)  . prismasol BGK 0/2.5 500 mL/hr at 06/16/19 2111  . prismasol BGK 0/2.5 500 mL/hr at 06/17/19 0740  . prismasol BGK 0/2.5 2,000 mL/hr at 06/17/19 1696  . vancomycin 1,250 mg (06/16/19 1502)   . amiodarone  200 mg Per Tube BID  . ARIPiprazole  10 mg Per Tube Daily  . aspirin  81 mg Per Tube Daily  . atorvastatin  40 mg Per Tube q1800  . atropine  0.5 mg Intravenous Once  . chlorhexidine  15 mL Mouth Rinse BID  . chlorhexidine gluconate (MEDLINE KIT)  15 mL Mouth Rinse BID  . Chlorhexidine Gluconate Cloth  6 each Topical Q0600  . Chlorhexidine Gluconate Cloth  6 each Topical Q0600  . darbepoetin (ARANESP) injection - NON-DIALYSIS  150 mcg Subcutaneous Q Mon-1800  . feeding supplement (PRO-STAT SUGAR FREE 64)  60 mL Per Tube BID  . FLUoxetine  20 mg Per Tube Daily  . free water  100 mL Per Tube Q4H  . insulin aspart  0-20 Units Subcutaneous Q4H  . insulin aspart  7 Units Subcutaneous Q4H  . insulin detemir  40 Units Subcutaneous BID  . mouth rinse  15 mL Mouth Rinse q12n4p  . midodrine  5 mg Per Tube Q8H  . pantoprazole sodium  40 mg Per Tube Q1200  . sevelamer carbonate  2.4 g Per Tube TID WC  . sodium chloride flush  10-40 mL Intracatheter Q12H   sodium chloride, acetaminophen (TYLENOL) oral liquid 160 mg/5 mL, ALPRAZolam,  bisacodyl, docusate, fentaNYL (SUBLIMAZE) injection, lip balm, loperamide HCl, LORazepam, midazolam, morphine injection, ondansetron (ZOFRAN) IV, sodium chloride, sodium chloride flush, zolpidem  Assessment/ Plan:  1. Acute kidney Injury: Multifactorial with dense ATN following ischemic and nephrotoxic injury followed by cardiogenic shock.  Was started on CRRT on 04/23/2019 and recently transitioned to intermittent hemodialysis since 05/25/2019.  He is anuric and without any evidence of renal recovery with hemodialysis on TTS schedule.  Overall prognosis is poor with sequelae of CVA and continued dialysis dependency.  Patient decompensated 06/15/2019 and is now in the intensive care unit.  Hypotension we will restart CRRT 06/16/2019.  Now requiring Levophed for blood pressure support. 2.  Acute CVA: Likely embolic with bilateral infarcts and LVEF thrombus for which he was treated with TPA on admission.  Significant residual neurological deficit with plan for LTAC.  Appears to have  residual encephalopathy. 3.  Hypoxic respiratory failure: Prolonged ventilator wean status post tracheostomy and now tolerating trach collar.  Chest x-ray showing pneumomediastinum and pneumothoraces 06/14/2019 4.  Hypertension: On midodrine at this time with fair blood pressure control allowing HD. 5.  Anemia: Status post intravenous iron and now on ESA.  No overt loss.  Continues on darbepoetin 150 mcg administered 06/13/2019 6. HIT currently being managed by pharmacy on bivalirudin transitioning to Coumadin anticoagulation 7.  Multiple pressure injury to skin 8.  Febrile illness blood cultures pending.  Patient started on vancomycin and cefepime 06/15/2019 9.  Disposition.  Patient appears to be awake but not following commands.  Family wishes are to press on.  Blood pressure is low requiring pressors.  Ventilator support and CRRT.  Will need to have family meeting with palliative medicine and critical care     LOS: Between @TODAY @8 :23 AM

## 2019-06-17 NOTE — Progress Notes (Signed)
SLP Cancellation Note  Patient Details Name: Carlos Brewer. MRN: [id number] DOB: [birth year]   Cancelled treatment:       Reason Eval/Treat Not Completed: Medical issues which prohibited therapy; full vent support. SLP will continue to follow.  [name] L. [name], [name] CCC/SLP Acute Rehabilitation Services Office number [phone] Pager [phone]   [name] 06/17/2019, 12:47 PM

## 2019-06-17 NOTE — Progress Notes (Signed)
NAME:  Carlos Brewer., MRN:  [id number], DOB:  Nov 16, 1985, LOS: 57 ADMISSION DATE:  05/01/2019, CONSULTATION DATE:  05/10/2019 REFERRING MD:  Dr. Leonel Ramsay, CHIEF COMPLAINT:  Slurred speech  Brief History   34 yo male smoker found to have slurred speech and Rt sided weakness.  Admitted with left MCA CVA and multiple smaller embolic infarcts s/p tPA and thrombectomy in IR.  Additionally found to have left ventricular  thrombus.    Course complicated by septic shock r/t HCAP, AKI requiring CRRT, polymorphic VT and cardiogenic shock.  Intermittent pressor dependence, required tracheostomy Intermittent hemodialysis [place] CVA Heart failure with reduced ejection fraction < 15% on last echocardiogram  Past Medical History  Systolic CHF with non ischemic CM, Cocaine abuse, OSA, DM Hx of CHF (EF 15%)  Hx myocarditis Smoker 1/2 ppd   Significant Hospital Events   2/05 Admit, tPA, IR thrombectomy 2/6 100% on fvent at 2300  Overnight requiring increasing levophed and phenylephrine.    2/6: switched to levophed, epi, started antibiotics, started on CRRT.  2/9  developed wide-complex tachycardia and hypotension, started on amiodarone drip, increase Levophed drip 2/15 drop in platelets, heparin stopped, bival started 2/17 Hypotensive and back on Levophed 2/18 trach, lines changed 2/19 started milrinone , fever 103 2/20 Episode of wide-complex tachycardia yesterday, changed from Levophed to vasopressin 2/22 stopping vanc. Still on inotrope support. Some AF w/ RVR. Had to be placed back on pressors. ivabradine added 2/23 still on pressors/ CRRT continued 2/24 tmax 98.4, Remains on CRRT, even UF, remains anuric, Levophed at 14 mcg/min, Milrinone at 0.125 mcg/kg/min, Coox 63.4 Doing well this morning on ATC, no events overnight.  Midodrine added   2/25 more interactive, remains on ATC, tmax 99.5/ WBC 21.4, ~900 ml liquid stool/ 24 hours, neg Cdiff, Levophed up to 20 mcg/min, CVP 2,  ,milrinone remains at 0.125 mcg/kg/min, coox 92.7, Off CRRT since last night ~2000 s/p clotted x 3 off citrate, restarted  3/1 Amio drip restarted for WCT/atrial flutter, milrinone turned off 3/2 CRRT stopped, permacath placed 3/5 crrt resumed 3/6 started on pressors for crrt tolerance 3/8 No longer on CRRT or pressor support  3/12 Tolerated PS for >2 hours  3/17  febrile 106 overnight, shock on pressors, new lines placed, broad-spectrum antibiotics 3/22: Awake, appears comfortable on pressure support ventilation.  Currently weaning Precedex, he is back on low-dose norepinephrine 3/23 pm back on vent at hs for resp distress / desats ? chf on cxr  3/24 t collar 24/7 3/29 tolerating ATM without difficulty 3/31 tr back to ICU -trach changed to 6 cuffless, bronched, new fever, started on antibiotics, pneumomediastinum small biapical pneumothoraces-stable 4/1 CRR T started  Consults:  Neuro IR Cardiology  Nephrology Heart Failure  EP   Procedures:  2/5-2/6:  TPA given at 428 am (total of 90 Mg) 520 am went to IR  S/P Lt common carotid arteriogram followed by complete revascularization of occluded LT MCA sup division mid M2 seg with x 1 pass with 4mx 40 mm solitaire X ret river device and penumbra aspiration with TICI 3 revascularization  ETT 2/05 >> 2/18 LIJ CVL 2/5 >>2/18  RT [place] CVL 2/18 >>3/2 RIJ HD cath 2/6 >>2/18 6 shiley cuffed trach 2/18  >>3/4  RT fem HD 2/19 >>3/2 RIJ permacath 3/2>> 3/4 shiley 4 uncuffed.... dislodged placed back 6cuffed shiley that evening 3/5: change to #6 cuffed distal XLT  3/17 right femoral CVL >> 3/23 3/17 right femoral arterial line >>3/23 3/31 bronchoscopy  3/31 trach change to cuffed #6 XLT   Significant Diagnostic Tests:  CT angio head/neck 2/05 >> occlusion of Lt MCA bifurcation Echo 2/05 >> EF less than 20%, cannot rule out apical thrombus MRI brain 2/11 > extensive acute infarction of multiple areas without large or medium vessel  occlusion Echocardiogram 2/8 Left ventricular ejection fraction, by estimation, is <20%. The left  ventricle has severely decreased function. The left ventricle demonstrates  global hypokinesis. The left ventricular internal cavity size was severely  dilated. Possible small 0.8 x 0.6 cm apical thrombus.   3/16 duplex upper and lower extremities >>neg  3/17 head CT >.  New area of hypoattenuation in the left pons , left frontal encephalomalacia related to old infarct 3/31CT chestreveals extensive pneumomediastinum and subcutaneous emphysema in the lower neck.  There is less than 5% bilateral pneumothoraces.  Extensive airspace disease right greater than left consistent with ARDS or infection  3/31 head CT >> neg for new pathology Micro Data:  SARS CoV2 PCR 2/05 >> negative Influenza PCR 2/05 >> negative ... Cdiff 2/25 >> neg Trach asp 3/11>> Few candida albicans BC 3/11>> 1 of 4 with Coag neg staph  BC 3/12 x 2 > neg  BC 3/17 x 2 > neg   trach 3/24 >>> no  wbc, no organisms final - nl flora  Bronchoscopy 3/31>> Blood cultures 3/31>>   Antimicrobials:  mero 2/6 -2/12 Zosyn 2/6 , 2/19 >> 2/23 vanc 2/6 >> 2/9 , 2/19 >>2/22 Ceftriaxone 3/12> 3/16 3/17 vanc >> 3/19 3/17 Anidulafungin >> 3/19 3/17 cefepime >>  3/22  3/31 vancomycin>> 3/31 cefepime>>  Interim history/subjective:   Has defervesced Critically ill, on CRRT, on low-dose Levophed Remains on vent, low-dose fentanyldrip  Objective   Blood pressure 108/70, pulse (!) 162, temperature 97.9 F (36.6 C), temperature source Axillary, resp. rate 20, height 6' 1" (1.854 m), weight 102.9 kg, SpO2 99 %.    Vent Mode: PRVC FiO2 (%):  [30 %] 30 % Set Rate:  [20 bmp] 20 bmp Vt Set:  [640 mL] 640 mL PEEP:  [5 cmH20] 5 cmH20 Plateau Pressure:  [25 cmH20-26 cmH20] 25 cmH20   Intake/Output Summary (Last 24 hours) at 06/17/2019 0934 Last data filed at 06/17/2019 0900 Gross per 24 hour  Intake 3313.28 ml  Output 2920 ml  Net  393.28 ml   Filed Weights   06/14/19 1233 06/16/19 0900 06/17/19 0500  Weight: 101.3 kg 102.3 kg 102.9 kg     Physical Exam: Young man, trach, on vent, no distress, on fentanyl drip Opens eyes to name and intermittently follows one-step commands Minimal bloody secretions on suctioning Lungs: Bilateral rhonchi, no crepitus, good air entry S1-S2 appreciated Abd obese, bowel sounds appreciated Extr warm with no edema or clubbing noted   Chest x-ray 4/2 personally reviewed which shows improved bilateral infiltrates, better aeration, persistent decreased pneumomediastinum.  Labs show decreased hyperkalemia, decreasing creatinine, CBGs improved   Resolved Hospital Problem list   HCAP 7/10, Acute metabolic encephalopathy 2nd to hypoxia and renal failure, Cardiogenic shock HIT   Assessment & Plan:   Acute on chronic respiratory failure , current worsening 3/31 attributed to aspiration Tracheostomy -note component of Cheyne-Stokes breathing  -Resume weaning efforts, with goal back to trach collar, had tolerated for a week in the past   Septic shock Recurrent aspiration pneumonia -Empiric cefepime and vancomycin, has now defervesced, simplify once resp cultures back -Levophed for MAP 65 and above    Pneumomediastinum, pneumothoraces -Improving on serial chest x-ray,  try to avoid high PEEP   Acute metabolic encephalopathy -Using fentanyl drip for neurogenic breathing, try to transition to intermittent fentanyl and Versed, goal RASS 0  Nonischemic cardiomyopathy Poor prognosis Per heart failure team   Chronic systolic CHF, non-ischemic Polymorphic VT. Apical thrombus in left ventricle.  Currently on ASA Lipitor and amiodarone  HIT -On bivalirudin, can transition back to Coumadin once PEG placed   Lt MCA CVA likely embolic. Left [place] CVA, ?new noted on 3/17 Hx of cocaine abuse, depression. Plan Continue supportive care   AKI from ATN in setting of hypoxia,  cardiogenic shock. Off CVVH since 3/7 , tolerating IHD since 3/10 Hyperkalemia Plan Per renal, CRRT resumed 4/1 -we will need goals of care discussion around this   Anemia of critical illness no evidence of bleeding   Plan Transfuse only for hemoglobin 7 or lower    DM type II poorly controlled with hyperglycemia. CBG (last 3)  Recent Labs    06/16/19 2305 06/17/19 0321 06/17/19 0813  GLUCAP 166* 137* 79   Plan Continue current Levemir and sliding scale insulin   The patient is critically ill with multiple organ systems failure and requires high complexity decision making for assessment and support, frequent evaluation and titration of therapies, application of advanced monitoring technologies and extensive interpretation of multiple databases. Critical Care Time devoted to patient care services described in this note independent of APP/resident  time is 33 minutes.   [name] MD. Shade Flood. [facility]  If no response to pager , please call [phone]   06/17/2019   06/17/2019, 9:34 AM

## 2019-06-18 ENCOUNTER — Inpatient Hospital Stay (HOSPITAL_COMMUNITY): Payer: No Typology Code available for payment source

## 2019-06-18 DIAGNOSIS — J9601 Acute respiratory failure with hypoxia: Secondary | ICD-10-CM | POA: Diagnosis not present

## 2019-06-18 DIAGNOSIS — N179 Acute kidney failure, unspecified: Secondary | ICD-10-CM | POA: Diagnosis not present

## 2019-06-18 DIAGNOSIS — I69391 Dysphagia following cerebral infarction: Secondary | ICD-10-CM

## 2019-06-18 DIAGNOSIS — I639 Cerebral infarction, unspecified: Secondary | ICD-10-CM | POA: Diagnosis not present

## 2019-06-18 DIAGNOSIS — J9500 Unspecified tracheostomy complication: Secondary | ICD-10-CM | POA: Diagnosis not present

## 2019-06-18 DIAGNOSIS — I5020 Unspecified systolic (congestive) heart failure: Secondary | ICD-10-CM

## 2019-06-18 LAB — CBC
HCT: 34.1 % — ABNORMAL LOW (ref 39.0–52.0)
Hemoglobin: 9.8 g/dL — ABNORMAL LOW (ref 13.0–17.0)
MCH: 30.4 pg (ref 26.0–34.0)
MCHC: 28.7 g/dL — ABNORMAL LOW (ref 30.0–36.0)
MCV: 105.9 fL — ABNORMAL HIGH (ref 80.0–100.0)
Platelets: 241 10*3/uL (ref 150–400)
RBC: 3.22 MIL/uL — ABNORMAL LOW (ref 4.22–5.81)
RDW: 17.9 % — ABNORMAL HIGH (ref 11.5–15.5)
WBC: 8.3 10*3/uL (ref 4.0–10.5)
nRBC: 1.7 % — ABNORMAL HIGH (ref 0.0–0.2)

## 2019-06-18 LAB — RENAL FUNCTION PANEL
Albumin: 2.6 g/dL — ABNORMAL LOW (ref 3.5–5.0)
Albumin: 2.5 g/dL — ABNORMAL LOW (ref 3.5–5.0)
Anion gap: 16 — ABNORMAL HIGH (ref 5–15)
Anion gap: 11 (ref 5–15)
BUN: 26 mg/dL — ABNORMAL HIGH (ref 6–20)
BUN: 25 mg/dL — ABNORMAL HIGH (ref 6–20)
CO2: 23 mmol/L (ref 22–32)
CO2: 21 mmol/L — ABNORMAL LOW (ref 22–32)
Calcium: 9.1 mg/dL (ref 8.9–10.3)
Calcium: 8.4 mg/dL — ABNORMAL LOW (ref 8.9–10.3)
Chloride: 99 mmol/L (ref 98–111)
Chloride: 102 mmol/L (ref 98–111)
Creatinine, Ser: 1.75 mg/dL — ABNORMAL HIGH (ref 0.61–1.24)
Creatinine, Ser: 1.68 mg/dL — ABNORMAL HIGH (ref 0.61–1.24)
GFR calc Af Amer: 57 mL/min — ABNORMAL LOW (ref >60)
GFR calc Af Amer: >60 mL/min (ref >60)
GFR calc non Af Amer: 49 mL/min — ABNORMAL LOW (ref >60)
GFR calc non Af Amer: 52 mL/min — ABNORMAL LOW (ref >60)
Glucose, Bld: 82 mg/dL (ref 70–99)
Glucose, Bld: 172 mg/dL — ABNORMAL HIGH (ref 70–99)
Phosphorus: 2.9 mg/dL (ref 2.5–4.6)
Phosphorus: 3.2 mg/dL (ref 2.5–4.6)
Potassium: 4 mmol/L (ref 3.5–5.1)
Potassium: 5.4 mmol/L — ABNORMAL HIGH (ref 3.5–5.1)
Sodium: 138 mmol/L (ref 135–145)
Sodium: 134 mmol/L — ABNORMAL LOW (ref 135–145)

## 2019-06-18 LAB — GLUCOSE, CAPILLARY
Glucose-Capillary: 81 mg/dL (ref 70–99)
Glucose-Capillary: 39 mg/dL — CL (ref 70–99)
Glucose-Capillary: 93 mg/dL (ref 70–99)
Glucose-Capillary: 77 mg/dL (ref 70–99)
Glucose-Capillary: 156 mg/dL — ABNORMAL HIGH (ref 70–99)
Glucose-Capillary: 130 mg/dL — ABNORMAL HIGH (ref 70–99)
Glucose-Capillary: 89 mg/dL (ref 70–99)

## 2019-06-18 LAB — PROTIME-INR
INR: 1.7 — ABNORMAL HIGH (ref 0.8–1.2)
Prothrombin Time: 19.9 seconds — ABNORMAL HIGH (ref 11.4–15.2)

## 2019-06-18 LAB — MAGNESIUM: Magnesium: 2.7 mg/dL — ABNORMAL HIGH (ref 1.7–2.4)

## 2019-06-18 LAB — APTT: aPTT: 58 seconds — ABNORMAL HIGH (ref 24–36)

## 2019-06-18 MED ORDER — POLYETHYLENE GLYCOL 3350 17 G PO PACK: 17.0000 g | PACK | Freq: Every day | ORAL | Status: DC

## 2019-06-18 MED ORDER — SODIUM CHLORIDE 0.9 % IV SOLN
3.0000 g | Freq: Three times a day (TID) | INTRAVENOUS | Status: AC
  Administered 2019-06-18 – 2019-06-19 (×5): 3 g via INTRAVENOUS
  Filled 2019-06-18 (×5): qty 3

## 2019-06-18 MED ORDER — DEXTROSE 50 % IV SOLN
INTRAVENOUS | Status: AC
  Administered 2019-06-18: 09:00:00 25 g via INTRAVENOUS
  Filled 2019-06-18: qty 50

## 2019-06-18 MED ORDER — DEXTROSE 5 % IV SOLN: INTRAVENOUS | Status: DC

## 2019-06-18 MED ORDER — POLYETHYLENE GLYCOL 3350 17 G PO PACK
17.0000 g | PACK | Freq: Every day | ORAL | Status: DC
  Administered 2019-06-19 – 2019-06-28 (×5): 17 g
  Filled 2019-06-18 (×6): qty 1

## 2019-06-18 MED ORDER — DEXTROSE 50 % IV SOLN: 25.0000 g | INTRAVENOUS | Status: AC

## 2019-06-18 NOTE — Progress Notes (Signed)
Hypoglycemic Event  CBG: 39  Treatment: D50 50 mL (25 gm)  Symptoms: None  Follow-up CBG: [id number] CBG Result:93  Possible Reasons for Event: Inadequate meal intake  Comments/MD notified: Dr. Brett Canales, Bailey Mech, RN

## 2019-06-18 NOTE — Progress Notes (Signed)
  HF team has been following recent events with recurrent septic shock.   Given comorbidities he is not a candidate for advanced HF therapies.  Volume status managed with HD.   We will sign off as we have little to offer. Please call me if I can help.   Glori Bickers, MD  4:58 PM

## 2019-06-18 NOTE — Progress Notes (Signed)
NAME:  Carlos Brewer., MRN:  [id number], DOB:  [birth year], LOS: 82 ADMISSION DATE:  05/06/2019, CONSULTATION DATE:  05/05/2019 REFERRING MD:  Dr. Leonel Ramsay, CHIEF COMPLAINT:  Slurred speech  Brief History   34 yo male smoker found to have slurred speech and Rt sided weakness.  Admitted with left MCA CVA and multiple smaller embolic infarcts s/p tPA and thrombectomy in IR.  Additionally found to have left ventricular  thrombus.    Course complicated by septic shock r/t HCAP, AKI requiring CRRT, polymorphic VT and cardiogenic shock.  Intermittent pressor dependence, required tracheostomy Intermittent hemodialysis [place] CVA Heart failure with reduced ejection fraction < 15% on last echocardiogram  Past Medical History  Systolic CHF with non ischemic CM, Cocaine abuse, OSA, DM Hx of CHF (EF 15%)  Hx myocarditis Smoker 1/2 ppd   Significant Hospital Events   2/05 Admit, tPA, IR thrombectomy 2/6 100% on fvent at 2300  Overnight requiring increasing levophed and phenylephrine.    2/6: switched to levophed, epi, started antibiotics, started on CRRT.  2/9  developed wide-complex tachycardia and hypotension, started on amiodarone drip, increase Levophed drip 2/15 drop in platelets, heparin stopped, bival started 2/17 Hypotensive and back on Levophed 2/18 trach, lines changed 2/19 started milrinone , fever 103 2/20 Episode of wide-complex tachycardia yesterday, changed from Levophed to vasopressin 2/22 stopping vanc. Still on inotrope support. Some AF w/ RVR. Had to be placed back on pressors. ivabradine added 2/23 still on pressors/ CRRT continued 2/24 tmax 98.4, Remains on CRRT, even UF, remains anuric, Levophed at 14 mcg/min, Milrinone at 0.125 mcg/kg/min, Coox 63.4 Doing well this morning on ATC, no events overnight.  Midodrine added   2/25 more interactive, remains on ATC, tmax 99.5/ WBC 21.4, ~900 ml liquid stool/ 24 hours, neg Cdiff, Levophed up to 20 mcg/min, CVP 2,  ,milrinone remains at 0.125 mcg/kg/min, coox 92.7, Off CRRT since last night ~2000 s/p clotted x 3 off citrate, restarted  3/1 Amio drip restarted for WCT/atrial flutter, milrinone turned off 3/2 CRRT stopped, permacath placed 3/5 crrt resumed 3/6 started on pressors for crrt tolerance 3/8 No longer on CRRT or pressor support  3/12 Tolerated PS for >2 hours  3/17  febrile 106 overnight, shock on pressors, new lines placed, broad-spectrum antibiotics 3/22: Awake, appears comfortable on pressure support ventilation.  Currently weaning Precedex, he is back on low-dose norepinephrine 3/23 pm back on vent at hs for resp distress / desats ? chf on cxr  3/24 t collar 24/7 3/29 tolerating ATM without difficulty 3/31 tr back to ICU -trach changed to 6 cuffless, bronched, new fever, started on antibiotics, pneumomediastinum small biapical pneumothoraces-stable 4/1 CRRT started  Consults:  Neuro IR Cardiology  Nephrology Heart Failure  EP   Procedures:  2/5-2/6:  TPA given at 428 am (total of 90 Mg) 520 am went to IR  S/P Lt common carotid arteriogram followed by complete revascularization of occluded LT MCA sup division mid M2 seg with x 1 pass with 96mx 40 mm solitaire X ret river device and penumbra aspiration with TICI 3 revascularization  ETT 2/05 >> 2/18 [facility] CVL 2/5 >>2/18  RT [place] CVL 2/18 >>3/2 RIJ HD cath 2/6 >>2/18 6 shiley cuffed trach 2/18  >>3/4  RT fem HD 2/19 >>3/2 RIJ permacath 3/2>> 3/4 shiley 4 uncuffed.... dislodged placed back 6cuffed shiley that evening 3/5: change to #6 cuffed distal XLT  3/17 right femoral CVL >> 3/23 3/17 right femoral arterial line >>3/23 3/31 bronchoscopy  3/31 trach change to cuffed #6 XLT   Significant Diagnostic Tests:  CT angio head/neck 2/05 >> occlusion of Lt MCA bifurcation Echo 2/05 >> EF less than 20%, cannot rule out apical thrombus MRI brain 2/11 > extensive acute infarction of multiple areas without large or medium vessel  occlusion Echocardiogram 2/8 Left ventricular ejection fraction, by estimation, is <20%. The left  ventricle has severely decreased function. The left ventricle demonstrates  global hypokinesis. The left ventricular internal cavity size was severely  dilated. Possible small 0.8 x 0.6 cm apical thrombus.   3/16 duplex upper and lower extremities >>neg  3/17 head CT >.  New area of hypoattenuation in the left pons , left frontal encephalomalacia related to old infarct 3/31CT chestreveals extensive pneumomediastinum and subcutaneous emphysema in the lower neck.  There is less than 5% bilateral pneumothoraces.  Extensive airspace disease right greater than left consistent with ARDS or infection  3/31 head CT >> neg for new pathology Micro Data:  SARS CoV2 PCR 2/05 >> negative Influenza PCR 2/05 >> negative ... Cdiff 2/25 >> neg Trach asp 3/11>> Few candida albicans BC 3/11>> 1 of 4 with Coag neg staph  BC 3/12 x 2 > neg  BC 3/17 x 2 > neg   trach 3/24 >>> no  wbc, no organisms final - nl flora  Bronchoscopy 3/31>> moderate GPC, normal respiratory flora Blood cultures 3/31>> NGTD   Antimicrobials:  mero 2/6 -2/12 Zosyn 2/6 , 2/19 >> 2/23 vanc 2/6 >> 2/9 , 2/19 >>2/22 Ceftriaxone 3/12> 3/16 3/17 vanc >> 3/19 3/17 Anidulafungin >> 3/19 3/17 cefepime >>  3/22  3/31 vancomycin>> 3/31 cefepime>>  Interim history/subjective:   Has defervesced Critically ill, on CRRT, on low-dose Levophed Remains on vent, low-dose fentanyl drip/ Cor-Trak pulled out overnight with concern for emesis.  Had hypoglycemic event requiring Amp of D50  Objective   Blood pressure 96/76, pulse 74, temperature 98 F (36.7 C), temperature source Oral, resp. rate 12, height 6' 1"  (1.854 m), weight 102.9 kg, SpO2 95 %.    Vent Mode: CPAP;PSV FiO2 (%):  [30 %] 30 % Set Rate:  [20 bmp] 20 bmp Vt Set:  [640 mL] 640 mL PEEP:  [5 cmH20] 5 cmH20 Pressure Support:  [10 cmH20] 10 cmH20 Plateau Pressure:  [23  cmH20-26 cmH20] 23 cmH20   Intake/Output Summary (Last 24 hours) at 06/18/2019 0826 Last data filed at 06/18/2019 0800 Gross per 24 hour  Intake 3497.13 ml  Output 2755 ml  Net 742.13 ml   Filed Weights   06/14/19 1233 06/16/19 0900 06/17/19 0500  Weight: 101.3 kg 102.3 kg 102.9 kg     Physical Exam: Young man, trach, on vent, no distress, on fentanyl drip Lungs: Bilateral rhonchi, no crepitus, good air entry S1-S2 appreciated Abd obese, bowel sounds appreciated Extr warm with no edema or clubbing noted Neuro: follows commands intermittently - will wiggle toes today  Chest x-ray 4/2 personally reviewed which shows improved bilateral infiltrates, better aeration, persistent decreased pneumomediastinum.   Resolved Hospital Problem list   HCAP 6/01, Acute metabolic encephalopathy 2nd to hypoxia and renal failure, Cardiogenic shock HIT   Assessment & Plan:   Acute on chronic respiratory failure , current worsening 3/31 attributed to aspiration Tracheostomy Pneumomediastinum, pneumothoraces -note component of Cheyne-Stokes breathing -daily PS trials with goal back to trach collar, had tolerated for a week in the past   AKI from ATN in setting of hypoxia, cardiogenic shock. Off CVVH since 3/7 , tolerating IHD since 3/10,  CRRT resumed 4/1 Hyperkalemia Plan Almost off norepinephrine. Suspect can transition to iHD once filter clots, would defer to renal.   Septic shock Recurrent aspiration pneumonia -Empiric cefepime and vancomycin, has now defervesced after 4 days. Narrow to unasyn for total 5 days abx. last day 4/4/. -Levophed for MAP 65 and above  Acute metabolic encephalopathy - multifactorial but largely due to mlutiple strokes -Using fentanyl drip for neurogenic breathing, try to transition to intermittent fentanyl and Versed, goal RASS 0  Chronic systolic CHF, non-ischemic EF 15% Polymorphic VT. Apical thrombus in left ventricle. Poor prognosis Per heart failure team.  Not a candidate for advanced mechanical support. Currently on ASA Lipitor and amiodarone  HIT -On bivalirudin, can transition back to Coumadin once PEG placed (planned for 4/5 Monday)  Lt MCA CVA likely embolic. Left pontine CVA, ?new noted on 3/17 Hx of cocaine abuse, depression. Plan Continue supportive care, statin   Anemia of critical illness no evidence of bleeding   Plan Transfuse only for hemoglobin 7 or lower    DM type II poorly controlled with hyperglycemia. CBG (last 3)  Recent Labs    06/17/19 1924 06/17/19 2323 06/18/19 0319  GLUCAP 119* 146* 81   Plan Continue current Levemir and sliding scale insulin  Best practice:  Diet: tube feeds. PEG tube planned for 06/20/2019. Will place NG today so he can get his meds until Monday. Pain/Anxiety/Delirium protocol (if indicated): prn fentanyl VAP protocol (if indicated): ordered DVT prophylaxis: on therapeutic AC GI prophylaxis: PPI Glucose control: at goal<180 Foley: anuric on crrt Mobility: bed rest Code Status: Full Family Communication: will update this afternoon Disposition: needs ICU    The patient is critically ill with multiple organ systems failure and requires high complexity decision making for assessment and support, frequent evaluation and titration of therapies, application of advanced monitoring technologies and extensive interpretation of multiple databases.   Critical Care Time devoted to patient care services described in this note is 39 minutes. This time reflects time of care of this [place] . This critical care time does not reflect separately billable procedures or procedure time, teaching time or supervisory time of PA/NP/Med student/Med Resident etc but could involve care discussion time.  [facility] 06/18/2019 8:29 AM  Pager: [phone] After hours pager: [phone]

## 2019-06-18 NOTE — Progress Notes (Addendum)
ANTICOAGULATION CONSULT NOTE - Follow Up Consult  Pharmacy Consult for Bival + Unasyn Indication: CVA,  LV apical thrombus + aspiration PNA  Allergies  Allergen Reactions  . Heparin Other (See Comments)    Heparin induced thrombocytopenia. 2/15 HIT OD 1.692. 2/16 SRA positive-90.     Patient Measurements: Height: 6\' 1"  (185.4 cm) Weight: 102.9 kg (226 lb 13.7 oz) IBW/kg (Calculated) : 79.9  Vital Signs: Temp: 96.3 F (35.7 C) (04/03 0800) Temp Source: Axillary (04/03 0800) BP: 82/65 (04/03 1045) Pulse Rate: 90 (04/03 1030)  Labs: Recent Labs    06/15/19 1127 06/15/19 1127 06/16/19 0637 06/16/19 0637 06/16/19 1110 06/16/19 1846 06/17/19 0815 06/17/19 0847 06/17/19 0850 06/17/19 1615  HGB 10.9*   < > 9.7*  --   --   --   --   --  10.4*  --   HCT 32.0*  --  33.0*  --   --   --   --   --  33.7*  --   PLT  --   --  345  --   --   --   --   --  332  --   APTT  --   --  72*  --  65*  --   --  78*  --   --   LABPROT  --   --  27.3*  --   --   --   --  24.7*  --   --   INR  --   --  2.6*  --   --   --   --  2.2*  --   --   CREATININE  --   --  6.11*   < >  --  4.38* 2.88*  --   --  2.31*   < > = values in this interval not displayed.    Estimated Creatinine Clearance: 56.3 mL/min (A) (by C-G formula based on SCr of 2.31 mg/dL (H)).  Assessment:  Anticoag: Small LV apical thrombus, HIT - on warfarin. *SRA positive (90) - Heparin allergy on chart. Bival off on 3/14.  - INR 2.2 on bival-holding warfarin for PEG. - Last aPTT 78 on bival 0.05mg /kg/hr. drawn approp. No reported bleeding. CBC stable.  - 4/3: Cannot get labs after multiple attempts. Patient has been stable with aPTT in goal 50-85 for days.  ID: Abx. for fever.  - WBC 13 down. Temp 96.3 - could be pulm fibrosis from amio? But unlikely per [facility] 2/6>>2/9, restart 2/19 >>2/22, 3/17>> 3/20, 3/31>4/3 Cefepime 3/17>> 3/22, 3/31>4/3 Unasyn 4/3>> Eraxis 3/17>> 3/20 Zosyn 2/6x1, restart 2/19 >>2/23 Merrem  2/6>>2/12 CTX 3/12 >> 3/16  2/5 covid / flu - negative 2/5 MRSA PCR - positive 2/6 UCx - negative 2/6 TA - negative 2/6 BCx - negative 2/16 TA - negative 2/19 BCx - Neg 2/25 cdiff - neg 2/25 BCx - neg 3/5 TA - neg 3/10 mrsa pcr+ 3/11 Bcx: 1/4 coag neg staph 3/11 Resp Cx: few candida albicans 3/12 BCx - ngtd 3/17 Bcx: NG 3/31 TA: mod GPC in clusters (reincubated) > normal flora 3/31 bcx: ngtd   Goal of Therapy:  aptt 50-85 Treatment for aspiration PNA   Plan:  - Cont bival 0.05mg /kg/hr - daily aPTT  - d/c Vanco, Cefepime, Start Unasyn 3g IV q8hr for CVVHD through 4/4.   Adden for late labs: 1445 aPTT 58 in goal. Hgb 9.8 down slightly. Plts 332>241? Will have to watch closely. Con't Bival at current  rate   Jacki Couse S. Alford [place], PharmD, BCPS Clinical Staff Pharmacist [email or web address] Alford [place], Octa Uplinger Stillinger 06/18/2019,11:00 AM

## 2019-06-18 NOTE — Progress Notes (Signed)
[facility] ROUNDING NOTE   Subjective:   Brief history this is a 34 year old gentleman with a history of systolic heart failure myocarditis tobacco abuse cocaine abuse nonischemic cardiomyopathy.  He had presented to the emergency room 05/15/2019 with a left MCA CVA multiple small embolic infarcts status post TPA thrombectomy in IR.  His hospital course been complicated by septic shock hospital-acquired pneumonia acute kidney injury requiring CRRT and ultimately dialysis polymorphic ventricular tachycardia and cardiogenic shock/septic shock requiring intermittent pressor dependence.  Due to difficulty with vent wean he has required tracheostomy 05/05/2019.  The plan is for placement of PEG tube and LTAC. Marland Kitchen  Underwent bronchoscopy 06/13/2019.  Has developed fever chills and greenish mucus from trach site.  Underwent dialysis 06/14/2019 with removal of 3 L.  Patient appears to have deteriorated 06/15/2019 and transferred to intensive care unit.  He is now hypotensive.  He is back on the ventilator.  He is back on CRRT started for 06/16/2019  Blood pressure 98/77 pulse 81 temperature 98 O2 sats 100% FiO2 30% Being kept even on CRRT.  Sodium 137 potassium 3.7 chloride 97 CO2 24 BUN 44 creatinine 2.31 glucose 200 calcium 8.8 phosphorus 3.1 albumin 2.5 WBC 13 hemoglobin 10.4  IV norepinephrine  Amiodarone 200 mg twice daily, Abilify 10 mg daily aspirin 81 mg daily atorvastatin 40 mg daily darbepoetin 150 mcg q. Monday Prozac 20 mg daily, insulin 7 units every 4 hours, insulin Levemir 40 units twice daily midodrine 5 mg every 8 hours Protonix 40 mg daily Renvela 2.4 g 3 times daily Coumadin dosing per pharmacy, Angiomax  IV vancomycin IV cefepime 06/15/2019  Last chest x-ray interval advancement of feeding tube otherwise stable chest with diffuse bilateral airspace opacities consistent with congestive heart failure 06/13/2019 Abdominal x-ray no enteric catheter identified  CT scan of head  expected interval evolution of subacute and chronic infarctions of the frontal lobes right cerebellar hemisphere and pons.  No acute intracranial pathology noted 06/15/2019  CT scan of chest showed extensive pneumomediastinum with subcutaneous emphysema in lower neck less than 5% bilateral pneumothoraces and extensive hydrogenous consolidation airspace disease worse right consistent with multifocal infection edema ARDS tracheostomy.  Objective:  Vital signs in last 24 hours:  Temp:  [97.7 F (36.5 C)-98.1 F (36.7 C)] 98 F (36.7 C) (04/03 0400) Pulse Rate:  [80-162] 80 (04/03 0600) Resp:  [18-22] 20 (04/03 0600) BP: (85-125)/(59-76) 93/73 (04/03 0600) SpO2:  [82 %-100 %] 82 % (04/03 0600) FiO2 (%):  [30 %] 30 % (04/03 0400)  Weight change:  Filed Weights   06/14/19 1233 06/16/19 0900 06/17/19 0500  Weight: 101.3 kg 102.3 kg 102.9 kg    Intake/Output: I/O last 3 completed shifts: In: 5450.5 [I.V.:2094.5; NG/GT:2806; IV Piggyback:549.9] Out: 8938 [BOFBP:1025]   Intake/Output this shift:  No intake/output data recorded.  Gen: Appears dyspneic this morning using trach collar greenish mucus around trach site CVS: Pulse regular tachycardia, S1 and S2 normal.  Right IJ TDC Resp: Coarse/transmitted breath sounds bilaterally, no rales Abd: Soft, obese, nontender Ext: No lower extremity edema   Basic Metabolic Panel: Recent Labs  Lab 06/14/19 0549 06/14/19 0549 06/15/19 1127 06/16/19 0637 06/16/19 0637 06/16/19 1846 06/17/19 0815 06/17/19 1615  NA 142   < > 139 144  --  141 139 137  K 4.8   < > 5.6* 7.0*  --  5.4* 5.6* 3.7  CL 105  --   --  101  --  99 99 97*  CO2 19*  --   --  18*  --  20* 17* 24  GLUCOSE 215*  --   --  125*  --  174* 76 200*  BUN 85*  --   --  124*  --  94* 56* 44*  CREATININE 4.06*  --   --  6.11*  --  4.38* 2.88* 2.31*  CALCIUM 9.5   < >  --  9.7   < > 9.2 9.0 8.8*  MG  --   --   --  2.6*  --   --  2.6*  --   PHOS  --   --   --  9.4*  --   --  5.2*  3.1   < > = values in this interval not displayed.    Liver Function Tests: Recent Labs  Lab 06/17/19 0815 06/17/19 1615  ALBUMIN 2.5* 2.5*   No results for input(s): LIPASE, AMYLASE in the last 168 hours. No results for input(s): AMMONIA in the last 168 hours.  CBC: Recent Labs  Lab 06/12/19 0213 06/12/19 0213 06/13/19 0841 06/14/19 0549 06/15/19 1127 06/16/19 0637 06/17/19 0850  WBC 10.2  --  10.3 11.1*  --  17.7* 13.0*  NEUTROABS  --   --   --   --   --   --  11.1*  HGB 10.2*   < > 11.0* 10.9* 10.9* 9.7* 10.4*  HCT 33.5*   < > 36.3* 35.4* 32.0* 33.0* 33.7*  MCV 104.0*  --  104.3* 102.6*  --  105.4* 106.0*  PLT 366  --  377 336  --  345 332   < > = values in this interval not displayed.    Cardiac Enzymes: No results for input(s): CKTOTAL, CKMB, CKMBINDEX, TROPONINI in the last 168 hours.  BNP: Invalid input(s): POCBNP  CBG: Recent Labs  Lab 06/17/19 1206 06/17/19 1544 06/17/19 1924 06/17/19 2323 06/18/19 0319  GLUCAP 181* 173* 119* 146* 81    Microbiology: Results for orders placed or performed during the hospital encounter of 04/26/2019  Respiratory Panel by RT PCR (Flu A&B, Covid) - Nasopharyngeal Swab     Status: None   Collection Time: 04/24/2019  4:42 AM   Specimen: Nasopharyngeal Swab  Result Value Ref Range Status   SARS Coronavirus 2 by RT PCR NEGATIVE NEGATIVE Final    Comment: (NOTE) SARS-CoV-2 target nucleic acids are NOT DETECTED. The SARS-CoV-2 RNA is generally detectable in upper respiratoy specimens during the acute phase of infection. The lowest concentration of SARS-CoV-2 viral copies this assay can detect is 131 copies/mL. A negative result does not preclude SARS-Cov-2 infection and should not be used as the sole basis for treatment or other patient management decisions. A negative result may occur with  improper specimen collection/handling, submission of specimen other than nasopharyngeal swab, presence of viral mutation(s) within  the areas targeted by this assay, and inadequate number of viral copies (<131 copies/mL). A negative result must be combined with clinical observations, patient history, and epidemiological information. The expected result is Negative. Fact Sheet for Patients:  PinkCheek.be Fact Sheet for Healthcare Providers:  GravelBags.it This test is not yet ap proved or cleared by the [place] FDA and  has been authorized for detection and/or diagnosis of SARS-CoV-2 by FDA under an Emergency Use Authorization (EUA). This EUA will remain  in effect (meaning this test can be used) for the duration of the COVID-19 declaration under Section 564(b)(1) of the Act, 21 U.S.C. section 360bbb-3(b)(1), unless the authorization is terminated or revoked sooner.  Influenza A by PCR NEGATIVE NEGATIVE Final   Influenza B by PCR NEGATIVE NEGATIVE Final    Comment: (NOTE) The Xpert Xpress SARS-CoV-2/FLU/RSV assay is intended as an aid in  the diagnosis of influenza from Nasopharyngeal swab specimens and  should not be used as a sole basis for treatment. Nasal washings and  aspirates are unacceptable for Xpert Xpress SARS-CoV-2/FLU/RSV  testing. Fact Sheet for Patients: PinkCheek.be Fact Sheet for Healthcare Providers: GravelBags.it This test is not yet approved or cleared by the [place] FDA and  has been authorized for detection and/or diagnosis of SARS-CoV-2 by  FDA under an Emergency Use Authorization (EUA). This EUA will remain  in effect (meaning this test can be used) for the duration of the  Covid-19 declaration under Section 564(b)(1) of the Act, 21  U.S.C. section 360bbb-3(b)(1), unless the authorization is  terminated or revoked. Performed at [facility], [place] [address], [place], [place] [address]   MRSA PCR Screening     Status: Abnormal   Collection Time: 04/19/2019   1:56 PM   Specimen: Nasal Mucosa; Nasopharyngeal  Result Value Ref Range Status   MRSA by PCR POSITIVE (A) NEGATIVE Final    Comment:        The GeneXpert MRSA Assay (FDA approved for NASAL specimens only), is one component of a comprehensive MRSA colonization surveillance program. It is not intended to diagnose MRSA infection nor to guide or monitor treatment for MRSA infections. RESULT CALLED TO, READ BACK BY AND VERIFIED WITH: Adrienne Mocha RN 16:20 05/13/2019 ([email or web address]) Performed at [facility], [place] [address], [place], [place] [address]   Culture, respiratory (non-expectorated)     Status: None   Collection Time: 04/23/19 10:58 AM   Specimen: Tracheal Aspirate; Respiratory  Result Value Ref Range Status   Specimen Description TRACHEAL ASPIRATE  Final   Special Requests NONE  Final   Gram Stain   Final    FEW WBC PRESENT, PREDOMINANTLY PMN FEW GRAM NEGATIVE COCCOBACILLI FEW GRAM POSITIVE COCCI IN PAIRS IN CLUSTERS RARE GRAM POSITIVE RODS    Culture   Final    ABUNDANT Consistent with normal respiratory flora. Performed at [facility], [place] [address], [place], [place] [address]    Report Status 04/25/2019 FINAL  Final  Culture, blood (Routine X 2) w Reflex to ID Panel     Status: None   Collection Time: 04/23/19 11:20 AM   Specimen: BLOOD  Result Value Ref Range Status   Specimen Description BLOOD RIGHT ANTECUBITAL  Final   Special Requests   Final    AEROBIC BOTTLE ONLY Blood Culture results may not be optimal due to an inadequate volume of blood received in culture bottles   Culture   Final    NO GROWTH 5 DAYS Performed at [facility], [place] [address] [place], [place] [address]    Report Status 04/28/2019 FINAL  Final  Culture, blood (Routine X 2) w Reflex to ID Panel     Status: None   Collection Time: 04/23/19 11:21 AM   Specimen: BLOOD  Result Value Ref Range Status   Specimen Description BLOOD RIGHT ANTECUBITAL  Final   Special Requests    Final    AEROBIC BOTTLE ONLY Blood Culture results may not be optimal due to an inadequate volume of blood received in culture bottles   Culture   Final    NO GROWTH 5 DAYS Performed at [facility], [place]  [address] [place], [place] [address]    Report Status 04/28/2019 FINAL  Final  Culture, Urine     Status: None   Collection Time: 04/23/19  1:56 PM   Specimen: Urine, Random  Result Value Ref Range Status   Specimen Description URINE, RANDOM  Final   Special Requests   Final    NONE Performed at [facility], [place] [address] [place], [place] [address]    Culture NO GROWTH  Final   Report Status 04/25/2019 FINAL  Final  Culture, respiratory (non-expectorated)     Status: None   Collection Time: 05/03/19 11:23 AM   Specimen: Tracheal Aspirate; Respiratory  Result Value Ref Range Status   Specimen Description TRACHEAL ASPIRATE  Final   Special Requests NONE  Final   Gram Stain   Final    MODERATE WBC PRESENT, PREDOMINANTLY PMN RARE GRAM POSITIVE COCCI    Culture   Final    Consistent with normal respiratory flora. Performed at [facility], [place] [address], [place], [place] [address]    Report Status 05/05/2019 FINAL  Final  Culture, blood (Routine X 2) w Reflex to ID Panel     Status: None   Collection Time: 05/06/19 11:45 AM   Specimen: BLOOD LEFT ARM  Result Value Ref Range Status   Specimen Description BLOOD LEFT ARM  Final   Special Requests   Final    BOTTLES DRAWN AEROBIC ONLY Blood Culture adequate volume   Culture   Final    NO GROWTH 5 DAYS Performed at [facility], [place] [address], [place], [place] [address]    Report Status 05/11/2019 FINAL  Final  Culture, blood (Routine X 2) w Reflex to ID Panel     Status: None   Collection Time: 05/06/19 12:20 PM   Specimen: BLOOD RIGHT ARM  Result Value Ref Range Status   Specimen Description BLOOD RIGHT ARM  Final   Special Requests   Final    BOTTLES DRAWN AEROBIC ONLY Blood Culture results  may not be optimal due to an inadequate volume of blood received in culture bottles   Culture   Final    NO GROWTH 5 DAYS Performed at [facility], [place] [address] [place], [place] [address]    Report Status 05/11/2019 FINAL  Final  C difficile quick scan w PCR reflex     Status: None   Collection Time: 05/12/19 10:26 AM   Specimen: STOOL  Result Value Ref Range Status   C Diff antigen NEGATIVE NEGATIVE Final   C Diff toxin NEGATIVE NEGATIVE Final   C Diff interpretation No C. difficile detected.  Final    Comment: Performed at [facility], [place] [address], [place], [place] [address]  Culture, blood (routine x 2)     Status: None   Collection Time: 05/12/19  9:07 PM   Specimen: BLOOD LEFT HAND  Result Value Ref Range Status   Specimen Description BLOOD LEFT HAND  Final   Special Requests   Final    Blood Culture results may not be optimal due to an inadequate volume of blood received in culture bottles   Culture   Final    NO GROWTH 5 DAYS Performed at [facility], [place] [address], [place],  [address]    Report Status 05/17/2019 FINAL  Final  Culture, blood (routine x 2)     Status: None   Collection Time: 05/12/19  9:21 PM  Specimen: BLOOD RIGHT HAND  Result Value Ref Range Status   Specimen Description BLOOD RIGHT HAND  Final   Special Requests   Final    BOTTLES DRAWN AEROBIC ONLY Blood Culture results may not be optimal due to an inadequate volume of blood received in culture bottles   Culture   Final    NO GROWTH 5 DAYS Performed at [facility], [place] [address] [place], [place] [address]    Report Status 05/17/2019 FINAL  Final  Culture, respiratory (non-expectorated)     Status: None   Collection Time: 05/20/19  8:06 PM   Specimen: Tracheal Aspirate; Respiratory  Result Value Ref Range Status   Specimen Description TRACHEAL ASPIRATE  Final   Special Requests NONE  Final   Gram Stain   Final    ABUNDANT WBC PRESENT, PREDOMINANTLY  PMN NO ORGANISMS SEEN    Culture   Final    NO GROWTH 2 DAYS Performed at [facility], [address], [place], [place] [address]    Report Status 05/23/2019 FINAL  Final  MRSA PCR Screening     Status: Abnormal   Collection Time: 05/25/19 11:06 AM   Specimen: Nasal Mucosa; Nasopharyngeal  Result Value Ref Range Status   MRSA by PCR POSITIVE (A) NEGATIVE Final    Comment:        The GeneXpert MRSA Assay (FDA approved for NASAL specimens only), is one component of a comprehensive MRSA colonization surveillance program. It is not intended to diagnose MRSA infection nor to guide or monitor treatment for MRSA infections. RESULT CALLED TO, READ BACK BY AND VERIFIED WITH: Silver Huguenin RN 13:15 05/25/19 ([email or web address]) Performed at [facility], [place] [address], [place], [place] [address]   Culture, blood (routine x 2)     Status: Abnormal   Collection Time: 05/26/19 10:45 AM   Specimen: BLOOD RIGHT HAND  Result Value Ref Range Status   Specimen Description BLOOD RIGHT HAND  Final   Special Requests AEROBIC BOTTLE ONLY Blood Culture adequate volume  Final   Culture  Setup Time   Final    GRAM POSITIVE COCCI AEROBIC BOTTLE ONLY CRITICAL RESULT CALLED TO, READ BACK BY AND VERIFIED WITH: PHARMD J STEENWYK [id number] MLM    Culture (A)  Final    STAPHYLOCOCCUS SPECIES (COAGULASE NEGATIVE) THE SIGNIFICANCE OF ISOLATING THIS ORGANISM FROM A SINGLE SET OF BLOOD CULTURES WHEN MULTIPLE SETS ARE DRAWN IS UNCERTAIN. PLEASE NOTIFY THE MICROBIOLOGY DEPARTMENT WITHIN ONE WEEK IF SPECIATION AND SENSITIVITIES ARE REQUIRED. Performed at [facility], [place] [address] [place], [place] [address]    Report Status 05/28/2019 FINAL  Final  Culture, blood (routine x 2)     Status: None   Collection Time: 05/26/19 10:50 AM   Specimen: BLOOD LEFT HAND  Result Value Ref Range Status   Specimen Description BLOOD LEFT HAND  Final   Special Requests   Final    AEROBIC BOTTLE ONLY Blood Culture  results may not be optimal due to an inadequate volume of blood received in culture bottles   Culture   Final    NO GROWTH 5 DAYS Performed at [facility], [place] [address], [place], [place] [address]    Report Status 05/31/2019 FINAL  Final  Culture, respiratory     Status: None   Collection Time: 05/26/19 11:52 AM   Specimen: Tracheal Aspirate; Respiratory  Result Value Ref Range Status   Specimen Description TRACHEAL ASPIRATE  Final   Special Requests Normal  Final   Gram Stain   Final    MODERATE WBC PRESENT, PREDOMINANTLY PMN NO ORGANISMS SEEN Performed at [facility], [place] [address] [place], [place] [address]    Culture FEW CANDIDA ALBICANS  Final   Report Status 05/29/2019 FINAL  Final  Culture, blood (routine x 2)     Status: None   Collection Time: 05/27/19  4:48 PM   Specimen: BLOOD  Result Value Ref Range Status   Specimen Description BLOOD RIGHT ANTECUBITAL  Final   Special Requests AEROBIC BOTTLE ONLY Blood Culture adequate volume  Final   Culture   Final    NO GROWTH 5 DAYS Performed at [place] [address], [place], [place] [address]    Report Status 06/01/2019 FINAL  Final  Culture, blood (routine x 2)     Status: None   Collection Time: 05/27/19  4:53 PM   Specimen: BLOOD RIGHT HAND  Result Value Ref Range Status   Specimen Description BLOOD RIGHT HAND  Final   Special Requests   Final    AEROBIC BOTTLE ONLY Blood Culture results may not be optimal due to an inadequate volume of blood received in culture bottles   Culture   Final    NO GROWTH 5 DAYS Performed at [facility], [place] [address] [place], [place] [address]    Report Status 06/01/2019 FINAL  Final  Culture, blood (routine x 2)     Status: None   Collection Time: 06/01/19  3:50 AM   Specimen: BLOOD  Result Value Ref Range Status   Specimen Description BLOOD A-LINE  Final   Special Requests   Final    BOTTLES DRAWN AEROBIC AND ANAEROBIC Blood Culture adequate  volume   Culture   Final    NO GROWTH 5 DAYS Performed at [facility], [place] [address], [place], [place] [address]    Report Status 06/06/2019 FINAL  Final  Culture, blood (Routine X 2) w Reflex to ID Panel     Status: None   Collection Time: 06/01/19  4:51 AM   Specimen: BLOOD RIGHT HAND  Result Value Ref Range Status   Specimen Description BLOOD RIGHT HAND  Final   Special Requests   Final    BOTTLES DRAWN AEROBIC ONLY Blood Culture adequate volume   Culture   Final    NO GROWTH 5 DAYS Performed at [facility], [place] [address], [place], [place] [address]    Report Status 06/06/2019 FINAL  Final  Culture, respiratory (non-expectorated)     Status: None   Collection Time: 06/08/19  8:08 AM   Specimen: Tracheal Aspirate; Respiratory  Result Value Ref Range Status   Specimen Description TRACHEAL ASPIRATE  Final   Special Requests Normal  Final   Gram Stain NO WBC SEEN NO ORGANISMS SEEN   Final   Culture   Final    FEW Consistent with normal respiratory flora. Performed at [facility], [place] [address] [place],  [address]    Report Status 06/10/2019 FINAL  Final  Culture, respiratory (non-expectorated)     Status: None   Collection Time: 06/15/19  4:47 AM   Specimen: Tracheal Aspirate; Respiratory  Result Value Ref Range Status   Specimen Description TRACHEAL ASPIRATE  Final   Special Requests Normal  Final   Gram Stain   Final    FEW WBC PRESENT, PREDOMINANTLY PMN MODERATE GRAM POSITIVE  COCCI IN PAIRS IN CLUSTERS Performed at [facility], [place] [address] [place], [place] [address]    Culture ABUNDANT Consistent with normal respiratory flora.  Final   Report Status 06/17/2019 FINAL  Final  Culture, blood (Routine X 2) w Reflex to ID Panel     Status: None (Preliminary result)   Collection Time: 06/15/19  5:41 AM   Specimen: BLOOD  Result Value Ref Range Status   Specimen Description BLOOD RIGHT ARM  Final   Special Requests   Final     BOTTLES DRAWN AEROBIC AND ANAEROBIC Blood Culture adequate volume   Culture   Final    NO GROWTH 2 DAYS Performed at [facility], [address] [place], [place] [address]    Report Status PENDING  Incomplete  Culture, blood (Routine X 2) w Reflex to ID Panel     Status: None (Preliminary result)   Collection Time: 06/15/19  5:46 AM   Specimen: BLOOD  Result Value Ref Range Status   Specimen Description BLOOD RIGHT ARM  Final   Special Requests   Final    BOTTLES DRAWN AEROBIC AND ANAEROBIC Blood Culture adequate volume   Culture   Final    NO GROWTH 2 DAYS Performed at [facility], [place] [address], [place], [place] [address]    Report Status PENDING  Incomplete    Coagulation Studies: Recent Labs    06/16/19 0637 06/17/19 0847  LABPROT 27.3* 24.7*  INR 2.6* 2.2*    Urinalysis: No results for input(s): COLORURINE, LABSPEC, PHURINE, GLUCOSEU, HGBUR, BILIRUBINUR, KETONESUR, PROTEINUR, UROBILINOGEN, NITRITE, LEUKOCYTESUR in the last 72 hours.  Invalid input(s): APPERANCEUR    Imaging: DG Chest Port 1 View  Result Date: 06/17/2019 CLINICAL DATA:  34 year old male with history of respiratory failure. EXAM: PORTABLE CHEST 1 VIEW COMPARISON:  Chest x-ray 06/16/2019. FINDINGS: A tracheostomy tube is in place with tip 4.0 cm above the carina. There is a right-sided internal jugular central venous catheter with tip terminating in the distal superior vena cava. Lung volumes are slightly low. Bibasilar opacities (left greater than right), which may reflect areas of atelectasis and/or consolidation. No definite pleural effusions. Moderate cardiomegaly. Lucencies adjacent to the heart and mediastinal contours, compatible with residual pneumomediastinum. No evidence of pulmonary edema. Small amount of gas in the cervical soft tissues. IMPRESSION: 1. Support apparatus, as above. 2. Persistent bibasilar areas of atelectasis and/or consolidation. 3. Moderate cardiomegaly. 4.  Persistent pneumomediastinum. Electronically Signed   By: Vinnie Langton M.D.   On: 06/17/2019 06:40     Medications:   .  prismasol BGK 4/2.5 500 mL/hr at 06/18/19 0518  .  prismasol BGK 4/2.5 500 mL/hr at 06/18/19 0518  . sodium chloride Stopped (06/17/19 1357)  . sodium chloride 250 mL (06/18/19 0200)  . sodium chloride    . bivalirudin (ANGIOMAX) infusion 0.5 mg/mL (Non-ACS indications) 0.05 mg/kg/hr (06/18/19 0700)  . ceFEPime (MAXIPIME) IV Stopped (06/18/19 0214)  . feeding supplement (VITAL 1.5 CAL) Stopped (06/18/19 0136)  . fentaNYL infusion INTRAVENOUS 50 mcg/hr (06/18/19 0700)  . norepinephrine (LEVOPHED) Adult infusion 5 mcg/min (06/18/19 0700)  . prismasol BGK 4/2.5 2,000 mL/hr at 06/18/19 0518  . vancomycin Stopped (06/17/19 1527)   . amiodarone  200 mg Per Tube BID  . ARIPiprazole  10 mg Per Tube Daily  . aspirin  81 mg Per Tube Daily  . atorvastatin  40 mg Per Tube q1800  . atropine  0.5 mg Intravenous Once  .  chlorhexidine  15 mL Mouth Rinse BID  . chlorhexidine gluconate (MEDLINE KIT)  15 mL Mouth Rinse BID  . Chlorhexidine Gluconate Cloth  6 each Topical Q0600  . Chlorhexidine Gluconate Cloth  6 each Topical Q0600  . darbepoetin (ARANESP) injection - NON-DIALYSIS  150 mcg Subcutaneous Q Mon-1800  . feeding supplement (PRO-STAT SUGAR FREE 64)  60 mL Per Tube BID  . FLUoxetine  20 mg Per Tube Daily  . free water  100 mL Per Tube Q4H  . insulin aspart  0-20 Units Subcutaneous Q4H  . insulin aspart  7 Units Subcutaneous Q4H  . insulin detemir  40 Units Subcutaneous BID  . mouth rinse  15 mL Mouth Rinse q12n4p  . midodrine  5 mg Per Tube Q8H  . pantoprazole sodium  40 mg Per Tube Q1200  . sevelamer carbonate  2.4 g Per Tube TID WC  . sodium chloride flush  10-40 mL Intracatheter Q12H   sodium chloride, acetaminophen (TYLENOL) oral liquid 160 mg/5 mL, ALPRAZolam, bisacodyl, docusate, fentaNYL (SUBLIMAZE) injection, lip balm, loperamide HCl, LORazepam,  midazolam, morphine injection, ondansetron (ZOFRAN) IV, sodium chloride, sodium chloride flush, zolpidem  Assessment/ Plan:  1. Acute kidney Injury: Multifactorial with dense ATN following ischemic and nephrotoxic injury followed by cardiogenic shock.  Was started on CRRT on 04/23/2019 and recently transitioned to intermittent hemodialysis since 05/25/2019 now restarted on CRRT 06/16/2019.  He is anuric and without any evidence of renal recovery..  Overall prognosis is poor with sequelae of CVA and continued dialysis dependency.  Patient decompensated 06/15/2019 and is now in the intensive care unit.  Requiring Levophed for blood pressure support. 2.  Acute CVA: Likely embolic with bilateral infarcts and LVEF thrombus for which he was treated with TPA on admission.  Significant residual neurological deficit with plan for LTAC.  Appears to have residual encephalopathy. 3.  Hypoxic respiratory failure: Prolonged ventilator wean status post tracheostomy and now tolerating trach collar.  Chest x-ray showing pneumomediastinum and pneumothoraces 06/14/2019 4.  Hypertension: Unfortunately developed shock with hypotension continues on midodrine but requiring IV pressors 5.  Anemia: Status post intravenous iron and now on ESA.  No overt loss.  Continues on darbepoetin 150 mcg administered 06/13/2019 6. HIT currently being managed by pharmacy on bivalirudin transitioning to Coumadin anticoagulation 7.  Multiple pressure injury to skin 8.  Febrile illness blood cultures no growth.  Patient started on vancomycin and cefepime 06/15/2019 9.  Disposition.  Patient appears to be awake but not following commands.  Family wishes are to press on.  Blood pressure is low requiring pressors.  Ventilator support and CRRT.  Will need to have family meeting with palliative medicine and critical care     LOS: [place] _0 _1 :29 AM

## 2019-06-19 DIAGNOSIS — I639 Cerebral infarction, unspecified: Secondary | ICD-10-CM | POA: Diagnosis not present

## 2019-06-19 DIAGNOSIS — I63312 Cerebral infarction due to thrombosis of left middle cerebral artery: Secondary | ICD-10-CM | POA: Diagnosis not present

## 2019-06-19 DIAGNOSIS — N179 Acute kidney failure, unspecified: Secondary | ICD-10-CM | POA: Diagnosis not present

## 2019-06-19 DIAGNOSIS — J9601 Acute respiratory failure with hypoxia: Secondary | ICD-10-CM | POA: Diagnosis not present

## 2019-06-19 LAB — APTT: aPTT: 55 seconds — ABNORMAL HIGH (ref 24–36)

## 2019-06-19 LAB — RENAL FUNCTION PANEL
Albumin: 2.5 g/dL — ABNORMAL LOW (ref 3.5–5.0)
Albumin: 2.5 g/dL — ABNORMAL LOW (ref 3.5–5.0)
Albumin: 2.5 g/dL — ABNORMAL LOW (ref 3.5–5.0)
Anion gap: 13 (ref 5–15)
Anion gap: 16 — ABNORMAL HIGH (ref 5–15)
Anion gap: 14 (ref 5–15)
BUN: 19 mg/dL (ref 6–20)
BUN: 25 mg/dL — ABNORMAL HIGH (ref 6–20)
BUN: 31 mg/dL — ABNORMAL HIGH (ref 6–20)
CO2: 25 mmol/L (ref 22–32)
CO2: 21 mmol/L — ABNORMAL LOW (ref 22–32)
CO2: 22 mmol/L (ref 22–32)
Calcium: 8.8 mg/dL — ABNORMAL LOW (ref 8.9–10.3)
Calcium: 8.6 mg/dL — ABNORMAL LOW (ref 8.9–10.3)
Calcium: 8.7 mg/dL — ABNORMAL LOW (ref 8.9–10.3)
Chloride: 99 mmol/L (ref 98–111)
Chloride: 101 mmol/L (ref 98–111)
Chloride: 101 mmol/L (ref 98–111)
Creatinine, Ser: 1.37 mg/dL — ABNORMAL HIGH (ref 0.61–1.24)
Creatinine, Ser: 1.67 mg/dL — ABNORMAL HIGH (ref 0.61–1.24)
Creatinine, Ser: 2.18 mg/dL — ABNORMAL HIGH (ref 0.61–1.24)
GFR calc Af Amer: >60 mL/min (ref >60)
GFR calc Af Amer: >60 mL/min (ref >60)
GFR calc Af Amer: 44 mL/min — ABNORMAL LOW (ref >60)
GFR calc non Af Amer: >60 mL/min (ref >60)
GFR calc non Af Amer: 52 mL/min — ABNORMAL LOW (ref >60)
GFR calc non Af Amer: 38 mL/min — ABNORMAL LOW (ref >60)
Glucose, Bld: 124 mg/dL — ABNORMAL HIGH (ref 70–99)
Glucose, Bld: 125 mg/dL — ABNORMAL HIGH (ref 70–99)
Glucose, Bld: 75 mg/dL (ref 70–99)
Phosphorus: 2 mg/dL — ABNORMAL LOW (ref 2.5–4.6)
Phosphorus: 2.2 mg/dL — ABNORMAL LOW (ref 2.5–4.6)
Phosphorus: 2.3 mg/dL — ABNORMAL LOW (ref 2.5–4.6)
Potassium: 3.9 mmol/L (ref 3.5–5.1)
Potassium: 4.2 mmol/L (ref 3.5–5.1)
Potassium: 4.2 mmol/L (ref 3.5–5.1)
Sodium: 137 mmol/L (ref 135–145)
Sodium: 138 mmol/L (ref 135–145)
Sodium: 137 mmol/L (ref 135–145)

## 2019-06-19 LAB — CBC
HCT: 31 % — ABNORMAL LOW (ref 39.0–52.0)
Hemoglobin: 9 g/dL — ABNORMAL LOW (ref 13.0–17.0)
MCH: 31 pg (ref 26.0–34.0)
MCHC: 29 g/dL — ABNORMAL LOW (ref 30.0–36.0)
MCV: 106.9 fL — ABNORMAL HIGH (ref 80.0–100.0)
Platelets: 209 10*3/uL (ref 150–400)
RBC: 2.9 MIL/uL — ABNORMAL LOW (ref 4.22–5.81)
RDW: 18.2 % — ABNORMAL HIGH (ref 11.5–15.5)
WBC: 8.1 10*3/uL (ref 4.0–10.5)
nRBC: 1.4 % — ABNORMAL HIGH (ref 0.0–0.2)

## 2019-06-19 LAB — GLUCOSE, CAPILLARY
Glucose-Capillary: 142 mg/dL — ABNORMAL HIGH (ref 70–99)
Glucose-Capillary: 93 mg/dL (ref 70–99)
Glucose-Capillary: 109 mg/dL — ABNORMAL HIGH (ref 70–99)
Glucose-Capillary: 75 mg/dL (ref 70–99)
Glucose-Capillary: 127 mg/dL — ABNORMAL HIGH (ref 70–99)

## 2019-06-19 LAB — MAGNESIUM: Magnesium: 2.7 mg/dL — ABNORMAL HIGH (ref 1.7–2.4)

## 2019-06-19 LAB — PROTIME-INR
INR: 1.6 — ABNORMAL HIGH (ref 0.8–1.2)
Prothrombin Time: 19 seconds — ABNORMAL HIGH (ref 11.4–15.2)

## 2019-06-19 MED ORDER — SODIUM CHLORIDE 0.9 % IV SOLN: 100.0000 mL | INTRAVENOUS | Status: DC | PRN

## 2019-06-19 MED ORDER — ALTEPLASE 2 MG IJ SOLR: 2.0000 mg | Freq: Once | INTRAMUSCULAR | Status: DC | PRN

## 2019-06-19 MED ORDER — CHLORHEXIDINE GLUCONATE CLOTH 2 % EX PADS: 6.0000 | MEDICATED_PAD | Freq: Every day | CUTANEOUS | Status: DC

## 2019-06-19 MED ORDER — MIDODRINE HCL 5 MG PO TABS
10.0000 mg | ORAL_TABLET | Freq: Three times a day (TID) | ORAL | Status: DC
  Administered 2019-06-19 – 2019-07-09 (×50): 10 mg
  Filled 2019-06-19 (×51): qty 2

## 2019-06-19 MED ORDER — LIDOCAINE HCL (PF) 1 % IJ SOLN: 5.0000 mL | INTRAMUSCULAR | Status: DC | PRN

## 2019-06-19 MED ORDER — LIDOCAINE-PRILOCAINE 2.5-2.5 % EX CREA: 1.0000 "application " | TOPICAL_CREAM | CUTANEOUS | Status: DC | PRN

## 2019-06-19 MED ORDER — PENTAFLUOROPROP-TETRAFLUOROETH EX AERO: 1.0000 "application " | INHALATION_SPRAY | CUTANEOUS | Status: DC | PRN

## 2019-06-19 MED ORDER — HEPARIN SODIUM (PORCINE) 1000 UNIT/ML DIALYSIS: 1000.0000 [IU] | INTRAMUSCULAR | Status: DC | PRN

## 2019-06-19 MED ORDER — ANTICOAGULANT SODIUM CITRATE 4% (200MG/5ML) IV SOLN
5.0000 mL | Status: DC | PRN
  Administered 2019-06-19: 3.2 mL
  Administered 2019-06-22: 5 mL
  Administered 2019-06-24 – 2019-06-30 (×2): 3.2 mL
  Administered 2019-07-07 – 2019-07-09 (×2): 5 mL
  Filled 2019-06-19 (×2): qty 5
  Filled 2019-06-19 (×2): qty 3.2
  Filled 2019-06-19 (×3): qty 5

## 2019-06-19 MED ORDER — DEXTROSE 5 % IV SOLN: INTRAVENOUS | Status: DC

## 2019-06-19 NOTE — Progress Notes (Signed)
ANTICOAGULATION CONSULT NOTE - Follow Up Consult  Pharmacy Consult for Bival Indication: CVA,  LV apical thrombus   Allergies  Allergen Reactions  . Heparin Other (See Comments)    Heparin induced thrombocytopenia. 2/15 HIT OD 1.692. 2/16 SRA positive-90.     Patient Measurements: Height: 6\' 1"  (185.4 cm) Weight: 102.9 kg (226 lb 13.7 oz) IBW/kg (Calculated) : 79.9  Vital Signs: Temp: 97.2 F (36.2 C) (04/04 0800) Temp Source: Axillary (04/04 0800) BP: 99/71 (04/04 0800) Pulse Rate: 89 (04/04 0800)  Labs: Recent Labs    06/17/19 0847 06/17/19 0850 06/17/19 1615 06/18/19 1112 06/18/19 1536 06/19/19 0425  HGB  --  10.4*  --  9.8*  --   --   HCT  --  33.7*  --  34.1*  --   --   PLT  --  332  --  241  --   --   APTT 78*  --   --  58*  --  55*  LABPROT 24.7*  --   --  19.9*  --  19.0*  INR 2.2*  --   --  1.7*  --  1.6*  CREATININE  --   --    < > 1.75* 1.68* 1.37*   < > = values in this interval not displayed.    Estimated Creatinine Clearance: 94.8 mL/min (A) (by C-G formula based on SCr of 1.37 mg/dL (H)).  Assessment:  Anticoag: Small LV apical thrombus, HIT - on warfarin. *SRA positive (90) - Heparin allergy on chart. Bival off on 3/14.  - INR 2.6 on bival-holding warfarin for PEG. - Last aPTT 55 in goal on bival 0.05mg /kg/hr. CBC ordered q48h   Goal of Therapy:  aptt 50-85 Treatment for aspiration PNA   Plan:  - Cont bival 0.05mg /kg/hr - daily aPTT, CBC q48h --can transition back to Coumadin once PEG placed (planned for 4/5 Monday)   Carlos Brewer S. Alford Highland, PharmD, BCPS Clinical Staff Pharmacist [email or web address] Alford Highland, Jeannifer Drakeford Stillinger 06/19/2019,9:30 AM

## 2019-06-19 NOTE — Progress Notes (Signed)
NAME:  Carlos Gornick., MRN:  [id number], DOB:  08-12-1985, LOS: 96 ADMISSION DATE:  04/27/2019, CONSULTATION DATE:  04/28/2019 REFERRING MD:  Dr. Leonel Ramsay, CHIEF COMPLAINT:  Slurred speech  Brief History   34 yo male smoker found to have slurred speech and Rt sided weakness.  Admitted with left MCA CVA and multiple smaller embolic infarcts s/p tPA and thrombectomy in IR.  Additionally found to have left ventricular  thrombus.    Course complicated by septic shock r/t HCAP, AKI requiring CRRT, polymorphic VT and cardiogenic shock.  Intermittent pressor dependence, required tracheostomy Intermittent hemodialysis [place] CVA Heart failure with reduced ejection fraction < 15% on last echocardiogram  Past Medical History  Systolic CHF with non ischemic CM, Cocaine abuse, OSA, DM Hx of CHF (EF 15%)  Hx myocarditis Smoker 1/2 ppd   Significant Hospital Events   2/05 Admit, tPA, IR thrombectomy 2/6 100% on fvent at 2300  Overnight requiring increasing levophed and phenylephrine.    2/6: switched to levophed, epi, started antibiotics, started on CRRT.  2/9  developed wide-complex tachycardia and hypotension, started on amiodarone drip, increase Levophed drip 2/15 drop in platelets, heparin stopped, bival started 2/17 Hypotensive and back on Levophed 2/18 trach, lines changed 2/19 started milrinone , fever 103 2/20 Episode of wide-complex tachycardia yesterday, changed from Levophed to vasopressin 2/22 stopping vanc. Still on inotrope support. Some AF w/ RVR. Had to be placed back on pressors. ivabradine added 2/23 still on pressors/ CRRT continued 2/24 tmax 98.4, Remains on CRRT, even UF, remains anuric, Levophed at 14 mcg/min, Milrinone at 0.125 mcg/kg/min, Coox 63.4 Doing well this morning on ATC, no events overnight.  Midodrine added   2/25 more interactive, remains on ATC, tmax 99.5/ WBC 21.4, ~900 ml liquid stool/ 24 hours, neg Cdiff, Levophed up to 20 mcg/min, CVP 2,  ,milrinone remains at 0.125 mcg/kg/min, coox 92.7, Off CRRT since last night ~2000 s/p clotted x 3 off citrate, restarted  3/1 Amio drip restarted for WCT/atrial flutter, milrinone turned off 3/2 CRRT stopped, permacath placed 3/5 crrt resumed 3/6 started on pressors for crrt tolerance 3/8 No longer on CRRT or pressor support  3/12 Tolerated PS for >2 hours  3/17  febrile 106 overnight, shock on pressors, new lines placed, broad-spectrum antibiotics 3/22: Awake, appears comfortable on pressure support ventilation.  Currently weaning Precedex, he is back on low-dose norepinephrine 3/23 pm back on vent at hs for resp distress / desats ? chf on cxr  3/24 t collar 24/7 3/29 tolerating ATM without difficulty 3/31 tr back to ICU -trach changed to 6 cuffless, bronched, new fever, started on antibiotics, pneumomediastinum small biapical pneumothoraces-stable 4/1 CRRT started  Consults:  Neuro IR Cardiology  Nephrology Heart Failure  EP   Procedures:  2/5-2/6:  TPA given at 428 am (total of 90 Mg) 520 am went to IR  S/P Lt common carotid arteriogram followed by complete revascularization of occluded LT MCA sup division mid M2 seg with x 1 pass with 37mx 40 mm solitaire X ret river device and penumbra aspiration with TICI 3 revascularization  ETT 2/05 >> 2/18 [facility] CVL 2/5 >>2/18  RT [place] CVL 2/18 >>3/2 [facility] HD cath 2/6 >>2/18 6 shiley cuffed trach 2/18  >>3/4  RT fem HD 2/19 >>3/2 [facility] permacath 3/2>> 3/4 shiley 4 uncuffed.... dislodged placed back 6cuffed shiley that evening 3/5: change to #6 cuffed distal XLT  3/17 right femoral CVL >> 3/23 3/17 right femoral arterial line >>3/23 3/31 bronchoscopy  3/31 trach change to cuffed #6 XLT   Significant Diagnostic Tests:  CT angio head/neck 2/05 >> occlusion of Lt MCA bifurcation Echo 2/05 >> EF less than 20%, cannot rule out apical thrombus MRI brain 2/11 > extensive acute infarction of multiple areas without large or medium vessel  occlusion Echocardiogram 2/8 Left ventricular ejection fraction, by estimation, is <20%. The left  ventricle has severely decreased function. The left ventricle demonstrates  global hypokinesis. The left ventricular internal cavity size was severely  dilated. Possible small 0.8 x 0.6 cm apical thrombus.   3/16 duplex upper and lower extremities >>neg  3/17 head CT >.  New area of hypoattenuation in the left pons , left frontal encephalomalacia related to old infarct 3/31CT chestreveals extensive pneumomediastinum and subcutaneous emphysema in the lower neck.  There is less than 5% bilateral pneumothoraces.  Extensive airspace disease right greater than left consistent with ARDS or infection  3/31 head CT >> neg for new pathology Micro Data:  SARS CoV2 PCR 2/05 >> negative Influenza PCR 2/05 >> negative ... Cdiff 2/25 >> neg Trach asp 3/11>> Few candida albicans BC 3/11>> 1 of 4 with Coag neg staph  BC 3/12 x 2 > neg  BC 3/17 x 2 > neg   trach 3/24 >>> no  wbc, no organisms final - nl flora  Bronchoscopy 3/31>> moderate GPC, normal respiratory flora Blood cultures 3/31>> NGTD   Antimicrobials:  mero 2/6 -2/12 Zosyn 2/6 , 2/19 >> 2/23 vanc 2/6 >> 2/9 , 2/19 >>2/22 Ceftriaxone 3/12> 3/16 3/17 vanc >> 3/19 3/17 Anidulafungin >> 3/19 3/17 cefepime >>  3/22  3/31 vancomycin>>4/3 3/31 cefepime>>4/3 Unasyn 4/3>>4/4  Interim history/subjective:  Tolerating tube feeds. Awake and alert and responsive to me today. Follows commands. Tolerating PS trial 5/5 this morning. Unable to take volume off on CRRT due to hypotension.  Objective   Blood pressure 99/71, pulse 89, temperature (!) 97.2 F (36.2 C), temperature source Axillary, resp. rate 18, height 6' 1"  (1.854 m), weight 102.9 kg, SpO2 100 %.    Vent Mode: CPAP;PSV FiO2 (%):  [30 %] 30 % Set Rate:  [20 bmp] 20 bmp Vt Set:  [640 mL] 640 mL PEEP:  [5 cmH20] 5 cmH20 Pressure Support:  [8 cmH20-10 cmH20] 8 cmH20 Plateau  Pressure:  [22 cmH20] 22 cmH20   Intake/Output Summary (Last 24 hours) at 06/19/2019 0942 Last data filed at 06/19/2019 0900 Gross per 24 hour  Intake 3700.74 ml  Output 3257 ml  Net 443.74 ml   Filed Weights   06/14/19 1233 06/16/19 0900 06/17/19 0500  Weight: 101.3 kg 102.3 kg 102.9 kg     Physical Exam: Young man, trach, on vent, no distress, on fentanyl drip Lungs: Bilateral rhonchi, no crepitus, good air entry CV: S1-S2 appreciated Abd obese, bowel sounds appreciated Extr warm with bilateral edema Neuro: awake, alert, follows commands but does reach for life support devices.   Chest x-ray 4/2 personally reviewed which shows improved bilateral infiltrates, better aeration, persistent decreased pneumomediastinum.   Resolved Hospital Problem list   HCAP 9/38, Acute metabolic encephalopathy 2nd to hypoxia and renal failure, Cardiogenic shock HIT   Assessment & Plan:   Acute on chronic respiratory failure , current worsening 3/31 attributed to aspiration Tracheostomy Pneumomediastinum, pneumothoraces -note component of Cheyne-Stokes breathing -tolerating PS trial 5/5 today. Will challenge him with TC trials starting today as tolerated.   Acute metabolic encephalopathy - multifactorial but largely due to multiple strokes -Using fentanyl drip for neurogenic breathing, try  to transition to intermittent fentanyl and Versed, goal RASS 0 AKI from ATN in setting of hypoxia, cardiogenic shock. Off CVVH since 3/7 , tolerating IHD since 3/10, CRRT resumed 4/1 Hyperkalemia Plan Almost off norepinephrine. Appreciate renal involvement and ongoing goals of care with family. Plan is to stop CRRT once filter clots.  Can increase his midodrine to 10 mg TID  Mixed Cardiogenic Septic shock Recurrent aspiration pneumonia -Empiric cefepime and vancomycin, has now defervesced after 4 days. Narrow to unasyn for total 5 days abx. last day today, 4/4.  -Levophed for MAP 65 and above   Chronic  systolic CHF, non-ischemic EF 15% Polymorphic VT. Apical thrombus in left ventricle. Poor prognosis Per heart failure team. Not a candidate for advanced mechanical support. Currently on ASA Lipitor and amiodarone  HIT -On bivalirudin, can transition back to Coumadin once PEG placed (planned for 4/5 Monday)  Lt MCA CVA likely embolic. Left pontine CVA, ?new noted on 3/17 Hx of cocaine abuse, depression. Plan Continue supportive care, statin  Anemia of critical illness no evidence of bleeding   Plan Transfuse only for hemoglobin 7 or lower    DM type II poorly controlled with hyperglycemia. CBG (last 3)  Recent Labs    06/18/19 2315 06/19/19 0332 06/19/19 0753  GLUCAP 89 142* 93   Plan Continue current Levemir and sliding scale insulin  Deconditioning Will revisit PT/OT as he is more alert today  Best practice:  Diet: tube feeds. PEG tube planned for 06/20/2019. Pain/Anxiety/Delirium protocol (if indicated): prn fentanyl VAP protocol (if indicated): ordered DVT prophylaxis: on therapeutic AC GI prophylaxis: PPI Glucose control: at goal<180 Foley: anuric on crrt Mobility: bed rest Code Status: Full Family Communication: mother updated 4/3. Disposition: needs ICU    The patient is critically ill with multiple organ systems failure and requires high complexity decision making for assessment and support, frequent evaluation and titration of therapies, application of advanced monitoring technologies and extensive interpretation of multiple databases.   Critical Care Time devoted to patient care services described in this note is 35 minutes. This time reflects time of care of this [facility] . This critical care time does not reflect separately billable procedures or procedure time, teaching time or supervisory time of PA/NP/Med student/Med Resident etc but could involve care discussion time.  [facility] 06/19/2019 9:42 AM  Pager: [phone] After hours pager: [phone]

## 2019-06-19 NOTE — Progress Notes (Signed)
[facility] ROUNDING NOTE   Subjective:   Brief history this is a 34 year old gentleman with a history of systolic heart failure myocarditis tobacco abuse cocaine abuse nonischemic cardiomyopathy.  He had presented to the emergency room 05/10/2019 with a left MCA CVA multiple small embolic infarcts status post TPA thrombectomy in IR.  His hospital course been complicated by septic shock hospital-acquired pneumonia acute kidney injury requiring CRRT and ultimately dialysis polymorphic ventricular tachycardia and cardiogenic shock/septic shock requiring intermittent pressor dependence.  Due to difficulty with vent wean he has required tracheostomy 05/05/2019.  The plan is for placement of PEG tube and LTAC. Marland Kitchen  Underwent bronchoscopy 06/13/2019.  Has developed fever chills and greenish mucus from trach site.  Underwent dialysis 06/14/2019 with removal of 3 L.  Patient appears to have deteriorated 06/15/2019 and transferred to intensive care unit.  He is now hypotensive.  He is back on the ventilator.  He is back on CRRT started for 06/16/2019  Blood pressure 108/68 pulse 80 temperature 98.8 O2 sats 100% FiO2 30 Being kept even on CRRT.  Sodium 137 potassium 3.9 chloride 99 CO2 25 BUN 19 creatinine 1.37 glucose 124 calcium 8.8 magnesium 2.7 albumin 2.5 hemoglobin 8.9  IV norepinephrine  Amiodarone 200 mg twice daily, Abilify 10 mg daily aspirin 81 mg daily atorvastatin 40 mg daily darbepoetin 150 mcg q. Monday Prozac 20 mg daily, insulin 7 units every 4 hours, insulin Levemir 40 units twice daily midodrine 5 mg every 8 hours Protonix 40 mg daily Renvela 2.4 g 3 times daily Coumadin dosing per pharmacy, Angiomax  IV Unasyn 3 g every 8 hours  Last chest x-ray interval advancement of feeding tube otherwise stable chest with diffuse bilateral airspace opacities consistent with congestive heart failure 06/13/2019 Abdominal x-ray no enteric catheter identified  CT scan of head expected interval  evolution of subacute and chronic infarctions of the frontal lobes right cerebellar hemisphere and pons.  No acute intracranial pathology noted 06/15/2019  CT scan of chest showed extensive pneumomediastinum with subcutaneous emphysema in lower neck less than 5% bilateral pneumothoraces and extensive hydrogenous consolidation airspace disease worse right consistent with multifocal infection edema ARDS tracheostomy.  Objective:  Vital signs in last 24 hours:  Temp:  [95.8 F (35.4 C)-98.8 F (37.1 C)] 98.8 F (37.1 C) (04/04 0400) Pulse Rate:  [29-98] 29 (04/04 0730) Resp:  [12-34] 23 (04/04 0730) BP: (69-125)/(31-82) 108/68 (04/04 0730) SpO2:  [91 %-100 %] 100 % (04/04 0730) FiO2 (%):  [30 %] 30 % (04/04 0300)  Weight change:  Filed Weights   06/14/19 1233 06/16/19 0900 06/17/19 0500  Weight: 101.3 kg 102.3 kg 102.9 kg    Intake/Output: I/O last 3 completed shifts: In: 4977.6 [I.V.:2028.4; Other:50; TZ/GY:1749; IV Piggyback:400.2] Out: 4496 [PRFFM:3846]   Intake/Output this shift:  No intake/output data recorded.  Gen: Appears dyspneic this morning using trach collar greenish mucus around trach site CVS: Pulse regular tachycardia, S1 and S2 normal.  Right IJ TDC Resp: Coarse/transmitted breath sounds bilaterally, no rales Abd: Soft, obese, nontender Ext: No lower extremity edema   Basic Metabolic Panel: Recent Labs  Lab 06/16/19 0637 06/16/19 1846 06/17/19 0815 06/17/19 0815 06/17/19 1615 06/17/19 1615 06/18/19 1112 06/18/19 1536 06/19/19 0425  NA 144   < > 139  --  137  --  138 134* 137  K 7.0*   < > 5.6*  --  3.7  --  4.0 5.4* 3.9  CL 101   < > 99  --  97*  --  99 102 99  CO2 18*   < > 17*  --  24  --  23 21* 25  GLUCOSE 125*   < > 76  --  200*  --  82 172* 124*  BUN 124*   < > 56*  --  44*  --  26* 25* 19  CREATININE 6.11*   < > 2.88*  --  2.31*  --  1.75* 1.68* 1.37*  CALCIUM 9.7   < > 9.0   < > 8.8*   < > 9.1 8.4* 8.8*  MG 2.6*  --  2.6*  --   --   --   2.7*  --  2.7*  PHOS 9.4*  --  5.2*  --  3.1  --  2.9 3.2 PENDING   < > = values in this interval not displayed.    Liver Function Tests: Recent Labs  Lab 06/17/19 0815 06/17/19 1615 06/18/19 1112 06/18/19 1536 06/19/19 0425  ALBUMIN 2.5* 2.5* 2.6* 2.5* 2.5*   No results for input(s): LIPASE, AMYLASE in the last 168 hours. No results for input(s): AMMONIA in the last 168 hours.  CBC: Recent Labs  Lab 06/13/19 0841 06/13/19 0841 06/14/19 0549 06/15/19 1127 06/16/19 0637 06/17/19 0850 06/18/19 1112  WBC 10.3  --  11.1*  --  17.7* 13.0* 8.3  NEUTROABS  --   --   --   --   --  11.1*  --   HGB 11.0*   < > 10.9* 10.9* 9.7* 10.4* 9.8*  HCT 36.3*   < > 35.4* 32.0* 33.0* 33.7* 34.1*  MCV 104.3*  --  102.6*  --  105.4* 106.0* 105.9*  PLT 377  --  336  --  345 332 241   < > = values in this interval not displayed.    Cardiac Enzymes: No results for input(s): CKTOTAL, CKMB, CKMBINDEX, TROPONINI in the last 168 hours.  BNP: Invalid input(s): POCBNP  CBG: Recent Labs  Lab 06/18/19 1142 06/18/19 1549 06/18/19 1939 06/18/19 2315 06/19/19 0332  GLUCAP 77 156* 130* 89 142*    Microbiology: Results for orders placed or performed during the hospital encounter of 04/19/2019  Respiratory Panel by RT PCR (Flu A&B, Covid) - Nasopharyngeal Swab     Status: None   Collection Time: 04/27/2019  4:42 AM   Specimen: Nasopharyngeal Swab  Result Value Ref Range Status   SARS Coronavirus 2 by RT PCR NEGATIVE NEGATIVE Final    Comment: (NOTE) SARS-CoV-2 target nucleic acids are NOT DETECTED. The SARS-CoV-2 RNA is generally detectable in upper respiratoy specimens during the acute phase of infection. The lowest concentration of SARS-CoV-2 viral copies this assay can detect is 131 copies/mL. A negative result does not preclude SARS-Cov-2 infection and should not be used as the sole basis for treatment or other patient management decisions. A negative result may occur with  improper  specimen collection/handling, submission of specimen other than nasopharyngeal swab, presence of viral mutation(s) within the areas targeted by this assay, and inadequate number of viral copies (<131 copies/mL). A negative result must be combined with clinical observations, patient history, and epidemiological information. The expected result is Negative. Fact Sheet for Patients:  PinkCheek.be Fact Sheet for Healthcare Providers:  GravelBags.it This test is not yet ap proved or cleared by the [place] FDA and  has been authorized for detection and/or diagnosis of SARS-CoV-2 by FDA under an Emergency Use Authorization (EUA). This EUA will remain  in effect (meaning  this test can be used) for the duration of the COVID-19 declaration under Section 564(b)(1) of the Act, 21 U.S.C. section 360bbb-3(b)(1), unless the authorization is terminated or revoked sooner.    Influenza A by PCR NEGATIVE NEGATIVE Final   Influenza B by PCR NEGATIVE NEGATIVE Final    Comment: (NOTE) The Xpert Xpress SARS-CoV-2/FLU/RSV assay is intended as an aid in  the diagnosis of influenza from Nasopharyngeal swab specimens and  should not be used as a sole basis for treatment. Nasal washings and  aspirates are unacceptable for Xpert Xpress SARS-CoV-2/FLU/RSV  testing. Fact Sheet for Patients: PinkCheek.be Fact Sheet for Healthcare Providers: GravelBags.it This test is not yet approved or cleared by the [place] FDA and  has been authorized for detection and/or diagnosis of SARS-CoV-2 by  FDA under an Emergency Use Authorization (EUA). This EUA will remain  in effect (meaning this test can be used) for the duration of the  Covid-19 declaration under Section 564(b)(1) of the Act, 21  U.S.C. section 360bbb-3(b)(1), unless the authorization is  terminated or revoked. Performed at [facility], [place] [address], [place], [place] [address]   MRSA PCR Screening     Status: Abnormal   Collection Time: 05/10/2019  1:56 PM   Specimen: Nasal Mucosa; Nasopharyngeal  Result Value Ref Range Status   MRSA by PCR POSITIVE (A) NEGATIVE Final    Comment:        The GeneXpert MRSA Assay (FDA approved for NASAL specimens only), is one component of a comprehensive MRSA colonization surveillance program. It is not intended to diagnose MRSA infection nor to guide or monitor treatment for MRSA infections. RESULT CALLED TO, READ BACK BY AND VERIFIED WITH: Adrienne Mocha RN 16:20 05/11/2019 ([email or web address]) Performed at [facility], [place] [address] [place] [address] [place], [place] [address]   Culture, respiratory (non-expectorated)     Status: None   Collection Time: 04/23/19 10:58 AM   Specimen: Tracheal Aspirate; Respiratory  Result Value Ref Range Status   Specimen Description TRACHEAL ASPIRATE  Final   Special Requests NONE  Final   Gram Stain   Final    FEW WBC PRESENT, PREDOMINANTLY PMN FEW GRAM NEGATIVE COCCOBACILLI FEW GRAM POSITIVE COCCI IN PAIRS IN CLUSTERS RARE GRAM POSITIVE RODS    Culture   Final    ABUNDANT Consistent with normal respiratory flora. Performed at [facility], [place] [address], [place], [place] [address]    Report Status 04/25/2019 FINAL  Final  Culture, blood (Routine X 2) w Reflex to ID Panel     Status: None   Collection Time: 04/23/19 11:20 AM   Specimen: BLOOD  Result Value Ref Range Status   Specimen Description BLOOD RIGHT ANTECUBITAL  Final   Special Requests   Final    AEROBIC BOTTLE ONLY Blood Culture results may not be optimal due to an inadequate volume of blood received in culture bottles   Culture   Final    NO GROWTH 5 DAYS Performed at [facility], [place] [address] [place], [place] [address]    Report Status 04/28/2019 FINAL  Final  Culture, blood (Routine X 2) w Reflex to ID Panel     Status: None   Collection Time: 04/23/19 11:21 AM    Specimen: BLOOD  Result Value Ref Range Status   Specimen Description BLOOD RIGHT ANTECUBITAL  Final   Special Requests   Final    AEROBIC BOTTLE ONLY Blood Culture results may not be optimal  due to an inadequate volume of blood received in culture bottles   Culture   Final    NO GROWTH 5 DAYS Performed at [facility], [place] [address], [place], [place] [address]    Report Status 04/28/2019 FINAL  Final  Culture, Urine     Status: None   Collection Time: 04/23/19  1:56 PM   Specimen: Urine, Random  Result Value Ref Range Status   Specimen Description URINE, RANDOM  Final   Special Requests   Final    NONE Performed at [facility], [place] [address] [place], [place] [address]    Culture NO GROWTH  Final   Report Status 04/25/2019 FINAL  Final  Culture, respiratory (non-expectorated)     Status: None   Collection Time: 05/03/19 11:23 AM   Specimen: Tracheal Aspirate; Respiratory  Result Value Ref Range Status   Specimen Description TRACHEAL ASPIRATE  Final   Special Requests NONE  Final   Gram Stain   Final    MODERATE WBC PRESENT, PREDOMINANTLY PMN RARE GRAM POSITIVE COCCI    Culture   Final    Consistent with normal respiratory flora. Performed at [facility], [place] [address], [place], [place] [address]    Report Status 05/05/2019 FINAL  Final  Culture, blood (Routine X 2) w Reflex to ID Panel     Status: None   Collection Time: 05/06/19 11:45 AM   Specimen: BLOOD LEFT ARM  Result Value Ref Range Status   Specimen Description BLOOD LEFT ARM  Final   Special Requests   Final    BOTTLES DRAWN AEROBIC ONLY Blood Culture adequate volume   Culture   Final    NO GROWTH 5 DAYS Performed at [facility], [place] [address] [place], [place] [address]    Report Status 05/11/2019 FINAL  Final  Culture, blood (Routine X 2) w Reflex to ID Panel     Status: None   Collection Time: 05/06/19 12:20 PM   Specimen: BLOOD RIGHT ARM  Result Value Ref Range Status    Specimen Description BLOOD RIGHT ARM  Final   Special Requests   Final    BOTTLES DRAWN AEROBIC ONLY Blood Culture results may not be optimal due to an inadequate volume of blood received in culture bottles   Culture   Final    NO GROWTH 5 DAYS Performed at [facility], [place] [address], [place],  [address]    Report Status 05/11/2019 FINAL  Final  C difficile quick scan w PCR reflex     Status: None   Collection Time: 05/12/19 10:26 AM   Specimen: STOOL  Result Value Ref Range Status   C Diff antigen NEGATIVE NEGATIVE Final   C Diff toxin NEGATIVE NEGATIVE Final   C Diff interpretation No C. difficile detected.  Final    Comment: Performed at [facility], [place] [address] [place],  [address]  Culture, blood (routine x 2)     Status: None   Collection Time: 05/12/19  9:07 PM   Specimen: BLOOD LEFT HAND  Result Value Ref Range Status   Specimen Description BLOOD LEFT HAND  Final   Special Requests   Final    Blood Culture results may not be optimal due to an inadequate volume of blood received in culture bottles   Culture   Final    NO GROWTH 5 DAYS Performed at [facility], [place] [address],  [place], [place] [address]    Report Status 05/17/2019 FINAL  Final  Culture, blood (routine x 2)     Status: None   Collection Time: 05/12/19  9:21 PM   Specimen: BLOOD RIGHT HAND  Result Value Ref Range Status   Specimen Description BLOOD RIGHT HAND  Final   Special Requests   Final    BOTTLES DRAWN AEROBIC ONLY Blood Culture results may not be optimal due to an inadequate volume of blood received in culture bottles   Culture   Final    NO GROWTH 5 DAYS Performed at [facility], [place] [address], [place], [place] [address]    Report Status 05/17/2019 FINAL  Final  Culture, respiratory (non-expectorated)     Status: None   Collection Time: 05/20/19  8:06 PM   Specimen: Tracheal Aspirate; Respiratory  Result Value Ref Range Status   Specimen  Description TRACHEAL ASPIRATE  Final   Special Requests NONE  Final   Gram Stain   Final    ABUNDANT WBC PRESENT, PREDOMINANTLY PMN NO ORGANISMS SEEN    Culture   Final    NO GROWTH 2 DAYS Performed at [facility], [address] [place], [place] [address]    Report Status 05/23/2019 FINAL  Final  MRSA PCR Screening     Status: Abnormal   Collection Time: 05/25/19 11:06 AM   Specimen: Nasal Mucosa; Nasopharyngeal  Result Value Ref Range Status   MRSA by PCR POSITIVE (A) NEGATIVE Final    Comment:        The GeneXpert MRSA Assay (FDA approved for NASAL specimens only), is one component of a comprehensive MRSA colonization surveillance program. It is not intended to diagnose MRSA infection nor to guide or monitor treatment for MRSA infections. RESULT CALLED TO, READ BACK BY AND VERIFIED WITH: Silver Huguenin RN 13:15 05/25/19 ([email or web address]) Performed at [facility], [place] [address] [place], [place] [address]   Culture, blood (routine x 2)     Status: Abnormal   Collection Time: 05/26/19 10:45 AM   Specimen: BLOOD RIGHT HAND  Result Value Ref Range Status   Specimen Description BLOOD RIGHT HAND  Final   Special Requests AEROBIC BOTTLE ONLY Blood Culture adequate volume  Final   Culture  Setup Time   Final    GRAM POSITIVE COCCI AEROBIC BOTTLE ONLY CRITICAL RESULT CALLED TO, READ BACK BY AND VERIFIED WITH: PHARMD J STEENWYK [id number] MLM    Culture (A)  Final    STAPHYLOCOCCUS SPECIES (COAGULASE NEGATIVE) THE SIGNIFICANCE OF ISOLATING THIS ORGANISM FROM A SINGLE SET OF BLOOD CULTURES WHEN MULTIPLE SETS ARE DRAWN IS UNCERTAIN. PLEASE NOTIFY THE MICROBIOLOGY DEPARTMENT WITHIN ONE WEEK IF SPECIATION AND SENSITIVITIES ARE REQUIRED. Performed at [facility], [place] [address], [place],  [address]    Report Status 05/28/2019 FINAL  Final  Culture, blood (routine x 2)     Status: None   Collection Time: 05/26/19 10:50 AM   Specimen: BLOOD LEFT HAND  Result  Value Ref Range Status   Specimen Description BLOOD LEFT HAND  Final   Special Requests   Final    AEROBIC BOTTLE ONLY Blood Culture results may not be optimal due to an inadequate volume of blood received in culture bottles   Culture   Final    NO GROWTH 5 DAYS Performed at [facility], [place] [address], [place],  [address]    Report Status 05/31/2019 FINAL  Final  Culture, respiratory     Status: None   Collection Time: 05/26/19 11:52 AM   Specimen: Tracheal Aspirate; Respiratory  Result Value Ref Range Status   Specimen Description TRACHEAL ASPIRATE  Final   Special Requests Normal  Final   Gram Stain   Final    MODERATE WBC PRESENT, PREDOMINANTLY PMN NO ORGANISMS SEEN Performed at [facility], [address], [place], [place] [address]    Culture FEW CANDIDA ALBICANS  Final   Report Status 05/29/2019 FINAL  Final  Culture, blood (routine x 2)     Status: None   Collection Time: 05/27/19  4:48 PM   Specimen: BLOOD  Result Value Ref Range Status   Specimen Description BLOOD RIGHT ANTECUBITAL  Final   Special Requests AEROBIC BOTTLE ONLY Blood Culture adequate volume  Final   Culture   Final    NO GROWTH 5 DAYS Performed at [place] [address], [place], [place] [address]    Report Status 06/01/2019 FINAL  Final  Culture, blood (routine x 2)     Status: None   Collection Time: 05/27/19  4:53 PM   Specimen: BLOOD RIGHT HAND  Result Value Ref Range Status   Specimen Description BLOOD RIGHT HAND  Final   Special Requests   Final    AEROBIC BOTTLE ONLY Blood Culture results may not be optimal due to an inadequate volume of blood received in culture bottles   Culture   Final    NO GROWTH 5 DAYS Performed at [facility], [place] [address] [place], [place] [address]    Report Status 06/01/2019 FINAL  Final  Culture, blood (routine x 2)     Status: None   Collection Time: 06/01/19  3:50 AM   Specimen: BLOOD  Result Value Ref Range Status    Specimen Description BLOOD A-LINE  Final   Special Requests   Final    BOTTLES DRAWN AEROBIC AND ANAEROBIC Blood Culture adequate volume   Culture   Final    NO GROWTH 5 DAYS Performed at [facility], [place] [address] [place], [place] [address]    Report Status 06/06/2019 FINAL  Final  Culture, blood (Routine X 2) w Reflex to ID Panel     Status: None   Collection Time: 06/01/19  4:51 AM   Specimen: BLOOD RIGHT HAND  Result Value Ref Range Status   Specimen Description BLOOD RIGHT HAND  Final   Special Requests   Final    BOTTLES DRAWN AEROBIC ONLY Blood Culture adequate volume   Culture   Final    NO GROWTH 5 DAYS Performed at [facility], [place] [address], [place], [place] [address]    Report Status 06/06/2019 FINAL  Final  Culture, respiratory (non-expectorated)     Status: None   Collection Time: 06/08/19  8:08 AM   Specimen: Tracheal Aspirate; Respiratory  Result Value Ref Range Status   Specimen Description TRACHEAL ASPIRATE  Final   Special Requests Normal  Final   Gram Stain NO WBC SEEN NO ORGANISMS SEEN   Final   Culture   Final    FEW Consistent with normal respiratory flora. Performed at [facility], [place] [address] [place], [place] [address]    Report Status 06/10/2019 FINAL  Final  Culture, respiratory (non-expectorated)     Status: None   Collection Time: 06/15/19  4:47 AM   Specimen: Tracheal Aspirate; Respiratory  Result Value Ref Range  Status   Specimen Description TRACHEAL ASPIRATE  Final   Special Requests Normal  Final   Gram Stain   Final    FEW WBC PRESENT, PREDOMINANTLY PMN MODERATE GRAM POSITIVE COCCI IN PAIRS IN CLUSTERS Performed at [facility], [place] [address] [place], [place] [address]    Culture ABUNDANT Consistent with normal respiratory flora.  Final   Report Status 06/17/2019 FINAL  Final  Culture, blood (Routine X 2) w Reflex to ID Panel     Status: None (Preliminary result)   Collection Time: 06/15/19  5:41  AM   Specimen: BLOOD  Result Value Ref Range Status   Specimen Description BLOOD RIGHT ARM  Final   Special Requests   Final    BOTTLES DRAWN AEROBIC AND ANAEROBIC Blood Culture adequate volume   Culture   Final    NO GROWTH 4 DAYS Performed at [facility], [address] [place], [place] [address]    Report Status PENDING  Incomplete  Culture, blood (Routine X 2) w Reflex to ID Panel     Status: None (Preliminary result)   Collection Time: 06/15/19  5:46 AM   Specimen: BLOOD  Result Value Ref Range Status   Specimen Description BLOOD RIGHT ARM  Final   Special Requests   Final    BOTTLES DRAWN AEROBIC AND ANAEROBIC Blood Culture adequate volume   Culture   Final    NO GROWTH 4 DAYS Performed at [facility], [place] [address] [place],  [address]    Report Status PENDING  Incomplete    Coagulation Studies: Recent Labs    06/17/19 0847 06/18/19 1112 06/19/19 0425  LABPROT 24.7* 19.9* 19.0*  INR 2.2* 1.7* 1.6*    Urinalysis: No results for input(s): COLORURINE, LABSPEC, PHURINE, GLUCOSEU, HGBUR, BILIRUBINUR, KETONESUR, PROTEINUR, UROBILINOGEN, NITRITE, LEUKOCYTESUR in the last 72 hours.  Invalid input(s): APPERANCEUR    Imaging: DG Abd Portable 1V  Result Date: 06/18/2019 CLINICAL DATA:  Nasogastric tube placement EXAM: PORTABLE ABDOMEN - 1 VIEW COMPARISON:  June 11, 2019 FINDINGS: Nasogastric tube tip and side port are in the stomach. No bowel dilatation or air-fluid level to suggest bowel obstruction. No free air. IMPRESSION: Nasogastric tube tip and side port in stomach. No bowel obstruction or free air demonstrable on supine examination. Electronically Signed   By: Lowella Grip III M.D.   On: 06/18/2019 11:29     Medications:   .  prismasol BGK 4/2.5 500 mL/hr at 06/19/19 0156  .  prismasol BGK 4/2.5 500 mL/hr at 06/19/19 0204  . sodium chloride Stopped (06/17/19 1357)  . sodium chloride 250 mL (06/18/19 0200)  . sodium chloride    .  ampicillin-sulbactam (UNASYN) IV Stopped (06/19/19 0410)  . bivalirudin (ANGIOMAX) infusion 0.5 mg/mL (Non-ACS indications) 0.05 mg/kg/hr (06/19/19 0700)  . feeding supplement (VITAL 1.5 CAL) 1,000 mL (06/19/19 0524)  . fentaNYL infusion INTRAVENOUS Stopped (06/18/19 1241)  . norepinephrine (LEVOPHED) Adult infusion 10 mcg/min (06/19/19 0700)  . prismasol BGK 4/2.5 2,000 mL/hr at 06/19/19 0736   . amiodarone  200 mg Per Tube BID  . ARIPiprazole  10 mg Per Tube Daily  . aspirin  81 mg Per Tube Daily  . atorvastatin  40 mg Per Tube q1800  . chlorhexidine  15 mL Mouth Rinse BID  . chlorhexidine gluconate (MEDLINE KIT)  15 mL Mouth Rinse BID  . Chlorhexidine Gluconate Cloth  6 each Topical Q0600  . Chlorhexidine Gluconate Cloth  6 each Topical Q0600  .  darbepoetin (ARANESP) injection - NON-DIALYSIS  150 mcg Subcutaneous Q Mon-1800  . feeding supplement (PRO-STAT SUGAR FREE 64)  60 mL Per Tube BID  . FLUoxetine  20 mg Per Tube Daily  . free water  100 mL Per Tube Q4H  . insulin aspart  0-20 Units Subcutaneous Q4H  . insulin aspart  7 Units Subcutaneous Q4H  . insulin detemir  40 Units Subcutaneous BID  . mouth rinse  15 mL Mouth Rinse q12n4p  . midodrine  5 mg Per Tube Q8H  . pantoprazole sodium  40 mg Per Tube Q1200  . polyethylene glycol  17 g Per Tube Daily  . sevelamer carbonate  2.4 g Per Tube TID WC  . sodium chloride flush  10-40 mL Intracatheter Q12H   sodium chloride, acetaminophen (TYLENOL) oral liquid 160 mg/5 mL, ALPRAZolam, bisacodyl, docusate, fentaNYL (SUBLIMAZE) injection, lip balm, loperamide HCl, LORazepam, midazolam, ondansetron (ZOFRAN) IV, sodium chloride, sodium chloride flush  Assessment/ Plan:  1. Acute kidney Injury: Multifactorial with dense ATN following ischemic and nephrotoxic injury followed by cardiogenic shock.  Was started on CRRT on 04/23/2019 and recently transitioned to intermittent hemodialysis since 05/25/2019 now restarted on CRRT 06/16/2019.  He is anuric  and without any evidence of renal recovery..  Overall prognosis is poor with sequelae of CVA and continued dialysis dependency.  Patient decompensated 06/15/2019 and is now in the intensive care unit.  Requiring Levophed for blood pressure support.  Will transition back to hemodialysis if filter clots.  Will discontinue CRRT at that time. 2.  Acute CVA: Likely embolic with bilateral infarcts and LVEF thrombus for which he was treated with TPA on admission.  Significant residual neurological deficit with plan for LTAC.  Appears to have residual encephalopathy. 3.  Hypoxic respiratory failure: Prolonged ventilator wean status post tracheostomy and now tolerating trach collar.  Chest x-ray showing pneumomediastinum and pneumothoraces 06/14/2019 4.  Hypertension: Unfortunately developed shock with hypotension continues on midodrine but requiring IV pressors 5.  Anemia: Status post intravenous iron and now on ESA.  No overt loss.  Continues on darbepoetin 150 mcg administered 06/13/2019 6. HIT currently being managed by pharmacy on bivalirudin transitioning to Coumadin anticoagulation 7.  Multiple pressure injury to skin 8.  Febrile illness blood cultures no growth.  Patient on Unasyn 3 g every 8 hours 9.  Disposition.  Patient appears to be awake but not following commands.  Family wishes are to press on.  Blood pressure is low requiring pressors.  Ventilator support.  Discussed with Langley Gauss the mother 06/18/2019.  She understands the poor prognosis and wishes to discuss with her other family members.  I favor discontinuation of dialysis and comfort care.     LOS: [place] @TODAY @7 :38 AM

## 2019-06-20 ENCOUNTER — Inpatient Hospital Stay (HOSPITAL_COMMUNITY): Payer: No Typology Code available for payment source

## 2019-06-20 DIAGNOSIS — R579 Shock, unspecified: Secondary | ICD-10-CM | POA: Diagnosis not present

## 2019-06-20 DIAGNOSIS — J9601 Acute respiratory failure with hypoxia: Secondary | ICD-10-CM | POA: Diagnosis not present

## 2019-06-20 DIAGNOSIS — I639 Cerebral infarction, unspecified: Secondary | ICD-10-CM | POA: Diagnosis not present

## 2019-06-20 DIAGNOSIS — Z515 Encounter for palliative care: Secondary | ICD-10-CM

## 2019-06-20 DIAGNOSIS — Z930 Tracheostomy status: Secondary | ICD-10-CM | POA: Diagnosis not present

## 2019-06-20 DIAGNOSIS — R042 Hemoptysis: Secondary | ICD-10-CM | POA: Diagnosis not present

## 2019-06-20 LAB — PROTIME-INR
INR: 1.6 — ABNORMAL HIGH (ref 0.8–1.2)
Prothrombin Time: 18.6 seconds — ABNORMAL HIGH (ref 11.4–15.2)

## 2019-06-20 LAB — RENAL FUNCTION PANEL
Albumin: 2.4 g/dL — ABNORMAL LOW (ref 3.5–5.0)
Albumin: 2.4 g/dL — ABNORMAL LOW (ref 3.5–5.0)
Anion gap: 16 — ABNORMAL HIGH (ref 5–15)
Anion gap: 13 (ref 5–15)
BUN: 46 mg/dL — ABNORMAL HIGH (ref 6–20)
BUN: 17 mg/dL (ref 6–20)
CO2: 23 mmol/L (ref 22–32)
CO2: 23 mmol/L (ref 22–32)
Calcium: 8.5 mg/dL — ABNORMAL LOW (ref 8.9–10.3)
Calcium: 8.7 mg/dL — ABNORMAL LOW (ref 8.9–10.3)
Chloride: 100 mmol/L (ref 98–111)
Chloride: 100 mmol/L (ref 98–111)
Creatinine, Ser: 3.04 mg/dL — ABNORMAL HIGH (ref 0.61–1.24)
Creatinine, Ser: 1.71 mg/dL — ABNORMAL HIGH (ref 0.61–1.24)
GFR calc Af Amer: 29 mL/min — ABNORMAL LOW (ref >60)
GFR calc Af Amer: 59 mL/min — ABNORMAL LOW (ref >60)
GFR calc non Af Amer: 25 mL/min — ABNORMAL LOW (ref >60)
GFR calc non Af Amer: 51 mL/min — ABNORMAL LOW (ref >60)
Glucose, Bld: 96 mg/dL (ref 70–99)
Glucose, Bld: 146 mg/dL — ABNORMAL HIGH (ref 70–99)
Phosphorus: 3.1 mg/dL (ref 2.5–4.6)
Phosphorus: 1.4 mg/dL — ABNORMAL LOW (ref 2.5–4.6)
Potassium: 3.7 mmol/L (ref 3.5–5.1)
Potassium: 3.9 mmol/L (ref 3.5–5.1)
Sodium: 139 mmol/L (ref 135–145)
Sodium: 136 mmol/L (ref 135–145)

## 2019-06-20 LAB — CBC
HCT: 27.6 % — ABNORMAL LOW (ref 39.0–52.0)
Hemoglobin: 8 g/dL — ABNORMAL LOW (ref 13.0–17.0)
MCH: 31.5 pg (ref 26.0–34.0)
MCHC: 29 g/dL — ABNORMAL LOW (ref 30.0–36.0)
MCV: 108.7 fL — ABNORMAL HIGH (ref 80.0–100.0)
Platelets: 224 10*3/uL (ref 150–400)
RBC: 2.54 MIL/uL — ABNORMAL LOW (ref 4.22–5.81)
RDW: 19 % — ABNORMAL HIGH (ref 11.5–15.5)
WBC: 10.4 10*3/uL (ref 4.0–10.5)
nRBC: 0.5 % — ABNORMAL HIGH (ref 0.0–0.2)

## 2019-06-20 LAB — CULTURE, BLOOD (ROUTINE X 2)
Culture: NO GROWTH
Culture: NO GROWTH
Special Requests: ADEQUATE
Special Requests: ADEQUATE

## 2019-06-20 LAB — SEDIMENTATION RATE: Sed Rate: 102 mm/hr — ABNORMAL HIGH (ref 0–16)

## 2019-06-20 LAB — APTT: aPTT: 65 seconds — ABNORMAL HIGH (ref 24–36)

## 2019-06-20 LAB — GLUCOSE, CAPILLARY
Glucose-Capillary: 114 mg/dL — ABNORMAL HIGH (ref 70–99)
Glucose-Capillary: 124 mg/dL — ABNORMAL HIGH (ref 70–99)
Glucose-Capillary: 136 mg/dL — ABNORMAL HIGH (ref 70–99)
Glucose-Capillary: 162 mg/dL — ABNORMAL HIGH (ref 70–99)
Glucose-Capillary: 168 mg/dL — ABNORMAL HIGH (ref 70–99)
Glucose-Capillary: 93 mg/dL (ref 70–99)
Glucose-Capillary: 95 mg/dL (ref 70–99)

## 2019-06-20 LAB — MAGNESIUM: Magnesium: 2.9 mg/dL — ABNORMAL HIGH (ref 1.7–2.4)

## 2019-06-20 LAB — PROCALCITONIN: Procalcitonin: 1.48 ng/mL

## 2019-06-20 MED ORDER — DARBEPOETIN ALFA 150 MCG/0.3ML IJ SOSY
150.0000 ug | PREFILLED_SYRINGE | INTRAMUSCULAR | Status: DC
  Administered 2019-06-20 – 2019-06-27 (×2): 150 ug via INTRAVENOUS
  Filled 2019-06-20 (×4): qty 0.3

## 2019-06-20 MED ORDER — ANTICOAGULANT SODIUM CITRATE 4% (200MG/5ML) IV SOLN
5.0000 mL | Freq: Once | Status: AC
  Administered 2019-06-20: 5 mL
  Filled 2019-06-20: qty 5

## 2019-06-20 MED ORDER — VITAL 1.5 CAL PO LIQD
1000.0000 mL | ORAL | Status: DC
  Administered 2019-06-20 – 2019-06-29 (×10): 1000 mL
  Filled 2019-06-20 (×16): qty 1000

## 2019-06-20 MED ORDER — DARBEPOETIN ALFA 150 MCG/0.3ML IJ SOSY
150.0000 ug | PREFILLED_SYRINGE | Freq: Once | INTRAMUSCULAR | Status: AC
  Administered 2019-06-20: 150 ug via SUBCUTANEOUS
  Filled 2019-06-20: qty 0.3

## 2019-06-20 NOTE — Consult Note (Signed)
[place] Nurse wound follow up Patient receiving care in [place]. Wound type: Groin wound is healed and no longer existent.   Left heel is now a stage 2. Measurement: 1.6 cm x 1.2 cm Wound bed: 100% pink Drainage (amount, consistency, odor) none Periwound: intact Dressing procedure/placement/frequency: Foam dressing and prevalon boot. Monitor the wound area(s) for worsening of condition such as: Signs/symptoms of infection,  Increase in size,  Development of or worsening of odor, Development of pain, or increased pain at the affected locations.  Notify the medical team if any of these develop. Val Riles, RN, MSN, CWOCN, CNS-BC, pager [phone]

## 2019-06-20 NOTE — Progress Notes (Signed)
PT Cancellation Note  Patient Details Name: Carlos Brewer. MRN: [id number] DOB: Jan 01, 1986   Cancelled Treatment:    Reason Eval/Treat Not Completed: Patient at procedure or test/unavailable. Pt undergoing bedside HD. PT to return as able, as appropriate.  Kittie Plater, PT, DPT Acute Rehabilitation Services Pager #: [phone] Office #: [phone]    Berline Lopes 06/20/2019, 10:55 AM

## 2019-06-20 NOTE — Progress Notes (Addendum)
ANTICOAGULATION CONSULT NOTE  Pharmacy Consult:  Bivalirudin  Indication: stroke 2/5, LV thrombus 2/8, HIT 2/15  Allergies  Allergen Reactions  . Heparin Other (See Comments)    Heparin induced thrombocytopenia. 2/15 HIT OD 1.692. 2/16 SRA positive-90.     Patient Measurements: Height: 6\' 1"  (185.4 cm) Weight: 104.8 kg (231 lb 0.7 oz) IBW/kg (Calculated) : 79.9   Vital Signs: Temp: 97.3 F (36.3 C) (04/05 0645) Temp Source: Axillary (04/05 0645) BP: 107/69 (04/05 0730) Pulse Rate: 90 (04/05 0730)  Labs: Recent Labs    06/17/19 0847 06/17/19 0850 06/18/19 1112 06/18/19 1112 06/18/19 1536 06/19/19 0425 06/19/19 1120 06/19/19 1615 06/20/19 0623  HGB  --    < > 9.8*   < >  --   --  9.0*  --  8.0*  HCT  --    < > 34.1*  --   --   --  31.0*  --  27.6*  PLT  --    < > 241  --   --   --  209  --  224  APTT 78*  --  58*  --   --  55*  --   --   --   LABPROT 24.7*  --  19.9*  --   --  19.0*  --   --   --   INR 2.2*  --  1.7*  --   --  1.6*  --   --   --   CREATININE  --    < > 1.75*  --    < > 1.37* 1.67* 2.18*  --    < > = values in this interval not displayed.    Estimated Creatinine Clearance: 60.1 mL/min (A) (by C-G formula based on SCr of 2.18 mg/dL (H)).  Assessment: 33 yr old male with history of ESRD on HD presented with left MCA infarct - received TPA and revascularization with IR on 2/5. On 2/8 found to have small LV apical thrombus on ECHO. Pharmacy consulted to dose IV heparin, which was switched to bivalirudin 2/15 for HIT. 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 SRA very positive at 85. Heparin allergy has appropriately been added to chart and updated with SRA results. Transitioned off bivalrudin on 3/14. Restarted bivalirudin 3/26 and briefly held on 3/31 for chest tube placement.   aPTT 65 on bivalirudin 0.05mg /kg/hr is therapeutic. Level was drawn appropriately. Hgb 8.0. Plt 224. Per RN patient has starting bleeding from  trach site - no other bleeding reported.  Plan is for PEG placement tomorrow, will follow up plans to resume warfarin and transition from bivalirudin to warfarin when appropriate.    Goal of Therapy:  APTT 50-85 seconds Monitor platelets by anticoagulation protocol: Yes   Plan:  Continue bivalirudin infusion at 0.05 mg/kg/hr  Monitor bleeding from trach site  Monitor aPTT, CBC, and S/S of bleeding daily   Cristela Felt, PharmD PGY1 Pharmacy Resident [employer]: [phone]  Addendum: Discussed with Dr. Melvyn Novas and will hold bivalirudin today given significant bleeding from trach. Will follow up plans for PEG tomorrow and resuming bivalirudin and warfarin.

## 2019-06-20 NOTE — Progress Notes (Signed)
NAME:  Carlos Kaczmarczyk., MRN:  [id number], DOB:  November 30, 1985, LOS: 33 ADMISSION DATE:  04/21/2019, CONSULTATION DATE:  05/04/2019 REFERRING MD:  Dr. Leonel Ramsay, CHIEF COMPLAINT:  Slurred speech  Brief History   34 yo male smoker found to have slurred speech and Rt sided weakness.  Admitted with left MCA CVA and multiple smaller embolic infarcts>>>  tPA and thrombectomy in IR.  Additionally found to have left ventricular  thrombus.    Course complicated by septic shock r/t HCAP, AKI requiring CRRT, polymorphic VT and cardiogenic shock.  Intermittent pressor dependence, required tracheostomy Intermittent hemodialysis [place] CVA Heart failure with reduced ejection fraction < 15% on last echocardiogram  Past Medical History  Systolic CHF with non ischemic CM, Cocaine abuse, OSA, DM Hx of CHF (EF 15%)  Hx myocarditis Smoker 1/2 ppd   Significant Hospital Events   2/05 Admit, tPA, IR thrombectomy 2/6 100% on fvent at 2300  Overnight requiring increasing levophed and phenylephrine.    2/6: switched to levophed, epi, started antibiotics, started on CRRT.  2/9  developed wide-complex tachycardia and hypotension, started on amiodarone drip, increase Levophed drip 2/15 drop in platelets, heparin stopped, bival started 2/17 Hypotensive and back on Levophed 2/18 trach, lines changed 2/19 started milrinone , fever 103 2/20 Episode of wide-complex tachycardia yesterday, changed from Levophed to vasopressin 2/22 stopping vanc. Still on inotrope support. Some AF w/ RVR. Had to be placed back on pressors. ivabradine added 2/23 still on pressors/ CRRT continued 2/24 tmax 98.4, Remains on CRRT, even UF, remains anuric, Levophed at 14 mcg/min, Milrinone at 0.125 mcg/kg/min, Coox 63.4 Doing well this morning on ATC, no events overnight.  Midodrine added   2/25 more interactive, remains on ATC, tmax 99.5/ WBC 21.4, ~900 ml liquid stool/ 24 hours, neg Cdiff, Levophed up to 20 mcg/min, CVP 2,  ,milrinone remains at 0.125 mcg/kg/min, coox 92.7, Off CRRT since last night ~2000 s/p clotted x 3 off citrate, restarted  3/1 Amio drip restarted for WCT/atrial flutter, milrinone turned off 3/2 CRRT stopped, permacath placed 3/5 crrt resumed 3/6 started on pressors for crrt tolerance 3/8 No longer on CRRT or pressor support  3/12 Tolerated PS for >2 hours  3/17  febrile 106 overnight, shock on pressors, new lines placed, broad-spectrum antibiotics 3/22: Awake, appears comfortable on pressure support ventilation. weaning Precedex, he is back on low-dose norepinephrine 3/23 pm back on vent at hs for resp distress / desats ? chf on cxr  3/24 t collar 24/7 3/29 tolerating ATM without difficulty 3/31 tr back to ICU -trach changed to 6 cuffless, bronched, new fever, started on antibiotics, pneumomediastinum small biapical pneumothoraces-stable 4/1 CRRT transient 4/5 started back on HD tiw   Consults:  Neuro IR Cardiology  Nephrology Heart Failure  EP   Procedures:  2/5-2/6:  TPA given at 428 am (total of 90 Mg) 520 am went to IR  S/P Lt common carotid arteriogram followed by complete revascularization of occluded LT MCA sup division mid M2 seg with x 1 pass with 36mx 40 mm solitaire X ret river device and penumbra aspiration with TICI 3 revascularization  ETT 2/05 >> 2/18 LIJ CVL 2/5 >>2/18  RT [place] CVL 2/18 >>3/2 RIJ HD cath 2/6 >>2/18 6 shiley cuffed trach 2/18  >>3/4  RT fem HD 2/19 >>3/2 RIJ permacath 3/2>> 3/4 shiley 4 uncuffed.... dislodged placed back 6cuffed shiley that evening 3/5: change to #6 cuffed distal XLT  3/17 right femoral CVL >> 3/23 3/17 right femoral arterial  line >>3/23 3/31 bronchoscopyL: airway clear, conclusion = end of trach up against the wall obstructing airflow   3/31 trach change to cuffed #6 XLT   Significant Diagnostic Tests:  CT angio head/neck 2/05 >> occlusion of Lt MCA bifurcation Echo 2/05 >> EF less than 20%, cannot rule out apical  thrombus MRI brain 2/11 > extensive acute infarction of multiple areas without large or medium vessel occlusion Echocardiogram 2/8 Left ventricular ejection fraction, by estimation, is <20%. The left  ventricle has severely decreased function. The left ventricle demonstrates  global hypokinesis. The left ventricular internal cavity size was severely  dilated. Possible small 0.8 x 0.6 cm apical thrombus.   3/16 duplex upper and lower extremities >>neg  3/17 head CT >.  New area of hypoattenuation in the left pons , left frontal encephalomalacia related to old infarct 3/31CT chestreveals extensive pneumomediastinum and subcutaneous emphysema in the lower neck.  There is less than 5% bilateral pneumothoraces.  Extensive airspace disease right greater than left consistent with ARDS or infection  3/31 head CT >> neg for new pathology Micro Data:  SARS CoV2 PCR 2/05 >> negative Influenza PCR 2/05 >> negative ... Cdiff 2/25 >> neg Trach asp 3/11>> Few candida albicans BC 3/11>> 1 of 4 with Coag neg staph  BC 3/12 x 2 > neg  BC 3/17 x 2 > neg   trach 3/24 >>> no  wbc, no organisms final - nl flora  Bronchoscopy 3/31>> moderate GPC, normal respiratory flora Blood cultures 3/31>> NGTD   Antimicrobials:  mero 2/6 -2/12 Zosyn 2/6 , 2/19 >> 2/23 vanc 2/6 >> 2/9 , 2/19 >>2/22 Ceftriaxone 3/12> 3/16 3/17 vanc >> 3/19 3/17 Anidulafungin >> 3/19 3/17 cefepime >>  3/22  3/31 vancomycin>>4/3 3/31 cefepime>>4/3 Unasyn 4/3>>4/4   Scheduled Meds: . amiodarone  200 mg Per Tube BID  . ARIPiprazole  10 mg Per Tube Daily  . aspirin  81 mg Per Tube Daily  . atorvastatin  40 mg Per Tube q1800  . chlorhexidine  15 mL Mouth Rinse BID  . chlorhexidine gluconate (MEDLINE KIT)  15 mL Mouth Rinse BID  . Chlorhexidine Gluconate Cloth  6 each Topical Q0600  . darbepoetin (ARANESP) injection - DIALYSIS  150 mcg Intravenous Q Mon-HD  . feeding supplement (PRO-STAT SUGAR FREE 64)  60 mL Per Tube BID  .  FLUoxetine  20 mg Per Tube Daily  . free water  100 mL Per Tube Q4H  . insulin aspart  0-20 Units Subcutaneous Q4H  . insulin aspart  7 Units Subcutaneous Q4H  . insulin detemir  40 Units Subcutaneous BID  . mouth rinse  15 mL Mouth Rinse q12n4p  . midodrine  10 mg Per Tube Q8H  . pantoprazole sodium  40 mg Per Tube Q1200  . polyethylene glycol  17 g Per Tube Daily  . sevelamer carbonate  2.4 g Per Tube TID WC  . sodium chloride flush  10-40 mL Intracatheter Q12H   Continuous Infusions: .  prismasol BGK 4/2.5 500 mL/hr at 06/19/19 0156  .  prismasol BGK 4/2.5 500 mL/hr at 06/19/19 0204  . sodium chloride Stopped (06/17/19 1357)  . sodium chloride    . sodium chloride    . sodium chloride    . anticoagulant sodium citrate    . anticoagulant sodium citrate    . bivalirudin (ANGIOMAX) infusion 0.5 mg/mL (Non-ACS indications) 0.05 mg/kg/hr (06/20/19 0800)  . dextrose 50 mL/hr at 06/20/19 0800  . fentaNYL infusion INTRAVENOUS Stopped (06/18/19 1241)  .  norepinephrine (LEVOPHED) Adult infusion 10 mcg/min (06/20/19 0800)  . prismasol BGK 4/2.5 2,000 mL/hr at 06/19/19 0736   PRN Meds:.sodium chloride, sodium chloride, sodium chloride, ALPRAZolam, alteplase, anticoagulant sodium citrate, bisacodyl, docusate, fentaNYL (SUBLIMAZE) injection, lidocaine (PF), lidocaine-prilocaine, lip balm, loperamide HCl, LORazepam, midazolam, ondansetron (ZOFRAN) IV, pentafluoroprop-tetrafluoroeth, sodium chloride, sodium chloride flush   Interim history/subjective:  Back on HD this am/ significant trach bleeding p suctioning this am/ back on vent/ still on levophed and prn fentanyl   Objective   Blood pressure 102/70, pulse 89, temperature 98 F (36.7 C), temperature source Axillary, resp. rate 20, height _0  (1.854 m), weight 104.8 kg, SpO2 100 %.    Vent Mode: PRVC FiO2 (%):  [28 %-30 %] 30 % Set Rate:  [20 bmp] 20 bmp Vt Set:  [640 mL] 640 mL PEEP:  [5 cmH20] 5 cmH20 Pressure Support:  [5 cmH20] 5  cmH20 Plateau Pressure:  [21 cmH20] 21 cmH20   Intake/Output Summary (Last 24 hours) at 06/20/2019 0915 Last data filed at 06/20/2019 0800 Gross per 24 hour  Intake 2947.66 ml  Output 246 ml  Net 2701.66 ml   Filed Weights   06/16/19 0900 06/17/19 0500 06/20/19 0645  Weight: 102.3 kg 102.9 kg 104.8 kg     Physical Exam Pt alert,  nad @ 30 degrees back on PRVC No jvd Neck supple Lungs with a minimal scattered exp > insp rhonchi bilaterally RRR no s3 or or sign murmur Abd obese with soft/ nl  excursion / tf was going well but on hold for PEG  Extr warm with no edema or clubbing noted Skin:  L heal stage II         Resolved Hospital Problem list   HCAP 7/51, Acute metabolic encephalopathy 2nd to hypoxia and renal failure, Cardiogenic shock HIT   Assessment & Plan:   Acute on chronic respiratory failure , current worsening 3/31 attributed to aspiration Tracheostomy Pneumomediastinum, pneumothoraces -note component of Cheyne-Stokes breathing >>> back on PRVC with active trach bleeding - note fob 3/31 s lesions .   Acute metabolic encephalopathy - multifactorial but largely due to multiple strokes -Using fentanyl drip for neurogenic breathing, try to transition to intermittent fentanyl and Versed, goal RASS 0   AKI from ATN in setting of hypoxia, cardiogenic shock. - back on HD 4/5 with goal of pulling off 1 liter per HD nurse but note still levophed    Plan May not be able to pull fluid off if levophed needs persist >>> follow cvp     Mixed Cardiogenic Septic shock Recurrent aspiration pneumonia but no source  -Levophed for MAP 65 and above -continue midodrine 10 mg tid (max dose)    Chronic systolic CHF, non-ischemic EF 15% Polymorphic VT. Apical thrombus in left ventricle. Poor prognosis Per heart failure team. Not a candidate for advanced mechanical support. Currently on ASA Lipitor and amiodarone -continue midodrine 10 mg tid (max dose)   HIT - actively  bleeding so hold bivalrudin for 24 hours (needs PEG anyway am 4/6 so timing is good anyway for holding the bivalrudin and proceeding with PEG unless unstable hemodynamically   Lt MCA CVA likely embolic. Left [place]ine CVA, ?new noted on 3/17 Hx of cocaine abuse, depression. Plan Continue supportive care, statin  Anemia of critical illness  / acute trach bleeding 4/5 am    Lab Results  Component Value Date   HGB 8.0 (L) 06/20/2019   HGB 9.0 (L) 06/19/2019   HGB 9.8 (L) 06/18/2019  Plan Transfuse only for hemoglobin 7 or lower    DM type II poorly controlled with hyperglycemia. CBG (last 3)  Recent Labs    06/20/19 0052 06/20/19 0417 06/20/19 0804  GLUCAP 168* 114* 95   Plan Continue current Levemir and sliding scale insulin  Deconditioning Will revisit PT/OT as he is more alert today  Best practice:  Diet: tube feeds. PEG tube planned for 4/6  Pain/Anxiety/Delirium protocol (if indicated): prn fentanyl VAP protocol (if indicated): ordered DVT prophylaxis:  PAS hose GI prophylaxis: PPI Glucose control: at goal<180 Foley: minimal uop/ no foley with residual of 60 cc am 4/5  Mobility: bed rest Code Status: Full Family Communication: mother visits 10 am am Disposition: needs ICU     The patient is critically ill with multiple organ systems failure and requires high complexity decision making for assessment and support, frequent evaluation and titration of therapies, application of advanced monitoring technologies and extensive interpretation of multiple databases. Critical Care Time devoted to patient care services described in this note is 45 minutes.    Christinia Gully, MD Pulmonary and [place] [phone] After 6:00 PM or weekends, use Beeper [phone]  After 7:00 pm call Elink  [phone]

## 2019-06-20 NOTE — Progress Notes (Signed)
Patient ID: Carlos Brewer., male   DOB: 1986/01/25, 34 y.o.   MRN: [id number]   Off CRRT--- now hemodialysis with Rt IJ temp cath For dialysis today  We will plan for dialysis in am  NPO and additional orders in chart

## 2019-06-20 NOTE — Progress Notes (Signed)
[facility] NEPHROLOGY PROGRESS NOTE  Assessment/ Plan: Pt is a 34 y.o. yo male with history of CHF, myocarditis, and NICM, stroke, hospital course complicated by septic shock, HCAP and AKI requiring CRRT.  #Acute kidney injury multifactorial etiology including ATN following cardiogenic/septic shock; Discontinue CRRT on 4/4.  Receiving intermittent hemodialysis today.  Changed to 4K bath and UF goal around 1 kg.  Currently on Levophed. Overall prognosis is poor given no improvement in encephalopathy and remains vent and dialysis dependent.  #Acute stroke: Likely embolic with bilateral infarcts and LV thrombus.  Has residual encephalopathy.  #Hypoxic respiratory failure, prolonged ventilator dependent, has tracheostomy.  Pulmonary following.  #Shock: Requiring levo. Weaning as tolerated.  #Anemia: Continue ESA.  No Iron because of concern of infection.  # CKD-MBD; on Renvela.  Phosphorus level acceptable. PTH 70.  Subjective: Seen and examined at bedside.  Tolerating intermittent hemodialysis well.  On Levophed with acceptable blood pressure reading.  No new event. Objective Vital signs in last 24 hours: Vitals:   06/20/19 1030 06/20/19 1045 06/20/19 1106 06/20/19 1115  BP: 111/78 116/73 107/73   Pulse: (!) 103 (!) 103 (!) 101   Resp: (!) 21 18 20    Temp:    98.5 F (36.9 C)  TempSrc:    Axillary  SpO2: 100% 100% 100%   Weight:      Height:       Weight change:   Intake/Output Summary (Last 24 hours) at 06/20/2019 1132 Last data filed at 06/20/2019 0800 Gross per 24 hour  Intake 2719.18 ml  Output 200 ml  Net 2519.18 ml       Labs: Basic Metabolic Panel: Recent Labs  Lab 06/19/19 1120 06/19/19 1615 06/20/19 0623  NA 138 137 139  K 4.2 4.2 3.7  CL 101 101 100  CO2 21* 22 23  GLUCOSE 125* 75 96  BUN 25* 31* 46*  CREATININE 1.67* 2.18* 3.04*  CALCIUM 8.6* 8.7* 8.5*  PHOS 2.2* 2.3* 3.1   Liver Function Tests: Recent Labs  Lab 06/19/19 1120  06/19/19 1615 06/20/19 0623  ALBUMIN 2.5* 2.5* 2.4*   No results for input(s): LIPASE, AMYLASE in the last 168 hours. No results for input(s): AMMONIA in the last 168 hours. CBC: Recent Labs  Lab 06/16/19 0637 06/16/19 0637 06/17/19 0850 06/17/19 0850 06/18/19 1112 06/19/19 1120 06/20/19 0623  WBC 17.7*   < > 13.0*   < > 8.3 8.1 10.4  NEUTROABS  --   --  11.1*  --   --   --   --   HGB 9.7*   < > 10.4*   < > 9.8* 9.0* 8.0*  HCT 33.0*   < > 33.7*   < > 34.1* 31.0* 27.6*  MCV 105.4*  --  106.0*  --  105.9* 106.9* 108.7*  PLT 345   < > 332   < > 241 209 224   < > = values in this interval not displayed.   Cardiac Enzymes: No results for input(s): CKTOTAL, CKMB, CKMBINDEX, TROPONINI in the last 168 hours. CBG: Recent Labs  Lab 06/19/19 2008 06/20/19 0052 06/20/19 0417 06/20/19 0804 06/20/19 1123  GLUCAP 127* 168* 114* 95 93    Iron Studies: No results for input(s): IRON, TIBC, TRANSFERRIN, FERRITIN in the last 72 hours. Studies/Results: DG Chest Port 1 View  Result Date: 06/20/2019 CLINICAL DATA:  Respiratory failure, hypoxia EXAM: PORTABLE CHEST 1 VIEW COMPARISON:  06/17/2019 FINDINGS: Interval removal of enteric feeding tube and placement of esophagogastric  tube, tip and side port below the diaphragm. Near complete interval resolution of previously noted pneumomediastinum. Tracheostomy. Unchanged cardiomegaly. Diffuse interstitial and heterogeneous airspace opacity, not significantly changed. IMPRESSION: 1. Interval removal of enteric feeding tube and placement of esophagogastric tube, tip and side port below the diaphragm. 2. Near complete interval resolution of previously noted pneumomediastinum. 3. Unchanged cardiomegaly and diffuse interstitial heterogeneous airspace opacity, consistent with edema, infection, and/or ARDS. No new airspace opacity. Electronically Signed   By: Eddie Candle M.D.   On: 06/20/2019 11:19    Medications: Infusions: . sodium chloride Stopped  (06/17/19 1357)  . sodium chloride    . sodium chloride    . sodium chloride    . anticoagulant sodium citrate    . dextrose 50 mL/hr at 06/20/19 0800  . feeding supplement (VITAL 1.5 CAL)    . fentaNYL infusion INTRAVENOUS Stopped (06/18/19 1241)  . norepinephrine (LEVOPHED) Adult infusion 10 mcg/min (06/20/19 0800)    Scheduled Medications: . amiodarone  200 mg Per Tube BID  . ARIPiprazole  10 mg Per Tube Daily  . aspirin  81 mg Per Tube Daily  . atorvastatin  40 mg Per Tube q1800  . chlorhexidine  15 mL Mouth Rinse BID  . chlorhexidine gluconate (MEDLINE KIT)  15 mL Mouth Rinse BID  . Chlorhexidine Gluconate Cloth  6 each Topical Q0600  . darbepoetin (ARANESP) injection - DIALYSIS  150 mcg Intravenous Q Mon-HD  . feeding supplement (PRO-STAT SUGAR FREE 64)  60 mL Per Tube BID  . FLUoxetine  20 mg Per Tube Daily  . free water  100 mL Per Tube Q4H  . insulin aspart  0-20 Units Subcutaneous Q4H  . insulin aspart  7 Units Subcutaneous Q4H  . insulin detemir  40 Units Subcutaneous BID  . mouth rinse  15 mL Mouth Rinse q12n4p  . midodrine  10 mg Per Tube Q8H  . pantoprazole sodium  40 mg Per Tube Q1200  . polyethylene glycol  17 g Per Tube Daily  . sevelamer carbonate  2.4 g Per Tube TID WC  . sodium chloride flush  10-40 mL Intracatheter Q12H    have reviewed scheduled and prn medications.  Physical Exam: General: On vent, not in distress, opens eyes with the name only Heart:RRR, s1s2 nl Lungs: Coarse breath sound bilateral Abdomen:soft, Non-tender, non-distended Extremities: Trace LE edema Dialysis Access: Right IJ TDC placed by IR on 3/2.  Carlos Brewer 06/20/2019,11:32 AM  LOS: 20 days  Pager: [phone]

## 2019-06-20 NOTE — Consult Note (Signed)
Consultation Note Date: 06/20/2019   Patient Name: Carlos Brewer.  DOB: [birth year]  MRN: [id number]  Age / Sex: 34 y.o., male  PCP: Patient, No Pcp Per Referring Physician: Tanda Rockers, MD  Reason for Consultation: Establishing goals of care and Psychosocial/spiritual support.    HPI/Patient Profile: 34 y.o. male  with past medical history of OSA, NICM, myocarditis in 2018 that left him with an EF of 15 - 20%, obesity and cocaine abuse who was admitted on 04/29/2019 with a left MCA CVA.  The patient is currently trach'd and remains on vent support.  He is due to receive a PEG tomorrow.  He has progressed from CRRT to intermittent HD over the last two days.  He still requires vasopressors and has difficulty managing his secretions.     Clinical Assessment and Goals of Care:  I have reviewed medical records including EPIC notes, labs and imaging, received report from bedside RN, examined the patient and met at bedside with his mother Carlos Brewer  to discuss diagnosis prognosis, [place], EOL wishes, disposition and options.  I introduced Palliative Medicine as specialized medical care for people living with serious illness. It focuses on providing relief from the symptoms and stress of a serious illness.   We discussed a brief life review of the patient. Carlos Brewer was in the [place] and served in [place].  Unfortunately he developed PTSD and is now on disability.  He has 5 children and worked driving trucks as a Science writer    His mother Carlos Brewer is his medical decision making surrogate.  Carlos Brewer's father lives out of state but they have an amicable relationship.  As far as functional and nutritional status Carlos Brewer is unable to take nutrition by mouth as he remains on the vent and is trach'd.  He is unable to speak but does grunt occasionally.  He makes eye contact with me and gives me a small smile.  He raises his left  arm in order to get the attention of the RN Tech.  He can move his left left and foot with purpose.   As I am sitting in his room he continually works a bit to clear his throat.  We discussed his current illness and what it means in the larger context of his on-going co-morbidities.  Natural disease trajectory was discussed.  I attempted to elicit values and goals of care important to the patient.  The difference between aggressive medical intervention and comfort care was considered in light of the patient's goals of care.   The patient's mother Carlos Brewer was a Radio broadcast assistant for the [employer].  After she retired she began working with disabled children and adults.  She has a very strong [place] faith.  Her hope is that [name] will continue to improve and then go thru rehab.  Months down the road she envisions taking him into her home to live with her and her husband.  Questions and concerns were addressed.  The family was encouraged to call with questions or concerns.  Primary Decision Maker:  NEXT OF KIN Mother Carlos Brewer.   Patient has 5 children, I don't believe any of them are of age.    SUMMARY OF RECOMMENDATIONS    Full scope treatment.  Family hopeful for continued improvement.  We discussed the needs to tolerate HD without pressors, and to hopefully be able to wean from the vent.  Proceed with PEG.  PMT will continue to follow for support and communication.   I will return on Thursday.  If PMT is needed prior to Thursday, please call our office on [phone] and request support.  Recommend outpatient Palliative follow up at next venue for patient and family support.  Code Status/Advance Care Planning:  Full Code   Symptom Management:  Per primary.  Patient appears comfortable.  Additional Recommendations (Limitations, Scope, Preferences):  Full Scope Treatment  Palliative Prophylaxis:   Aspiration  Psycho-social/Spiritual:   Desire for further  Chaplaincy support: requested.  Prognosis: Patient is at high risk for acute decline, rehospitalization, or death.  It would not be surprising if he passed in the next 12 months.    Discharge Planning: To Be Determined      Primary Diagnoses: Present on Admission: . Occlusion of left middle cerebral artery . Middle cerebral artery embolism, left   I have reviewed the medical record, interviewed the patient and family, and examined the patient. The following aspects are pertinent.  Past Medical History:  Diagnosis Date  . CHF (congestive heart failure) ([place])   . Cocaine abuse ([place])   . HFrEF (heart failure with reduced ejection fraction) ([place])    a. 10/2016 Echo: EF 15-20%, Gr2 DD, mildly dil LA/RA.  Marland Kitchen History of cardiac cath    a. 10/2016 Cath: LM nl, LAD min irregs, LCX nl, RCA nl, EF 15%.  . Morbid obesity ([place])   . Myocarditis ([place])    a. 10/2016 Admit w/ CHF and trop elevation; b. 10/2016 Echo: EF 15-20%; c. 10/2016 Cath: Min irregs in LAD otw nl cors, EF 15%, glob HK.  Marland Kitchen NICM (nonischemic cardiomyopathy) ([place])    a. 10/2016 Echo: EF 15-20%, Gr2 DD; b. 10/2016 Cath: Nl cors.  . Palpitations   . Sleep apnea    USES CPAP  . Tobacco abuse    Social History   Socioeconomic History  . Marital status: Single    Spouse name: Not on file  . Number of children: Not on file  . Years of education: Not on file  . Highest education level: Not on file  Occupational History  . 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 Smoking status: Current Every Day Smoker    Packs/day: 0.50    Types: Cigarettes  . Smokeless tobacco: Never Used  Substance and Sexual Activity  . Alcohol use: Yes  . Drug use: Not Currently    Types: Cocaine    Comment: quit 10/23/2016   . Sexual activity: Yes    Birth control/protection: None  Other Topics Concern  . Not on file  Social History Narrative   ** Merged History Encounter **       ** Merged History Encounter **   Lives in  [place] w/ girlfriend.  He does not routinely exercise.   Social Determinants of Health   Financial Resource Strain:   . Difficulty of Paying Living Expenses:   Food Insecurity:   . Worried About Charity fundraiser in the Last Year:   . Arboriculturist in  the Last Year:   Transportation Needs:   . Film/video editor (Medical):   Marland Kitchen Lack of Transportation (Non-Medical):   Physical Activity:   . Days of Exercise per Week:   . Minutes of Exercise per Session:   Stress:   . Feeling of Stress :   Social Connections:   . Frequency of Communication with Friends and Family:   . Frequency of Social Gatherings with Friends and Family:   . Attends Religious Services:   . Active Member of Clubs or Organizations:   . Attends Archivist Meetings:   Marland Kitchen Marital Status:    Family History  Problem Relation Age of Onset  . Healthy Mother   . Heart failure Father        EF is 35%   Scheduled Meds: . amiodarone  200 mg Per Tube BID  . ARIPiprazole  10 mg Per Tube Daily  . aspirin  81 mg Per Tube Daily  . atorvastatin  40 mg Per Tube q1800  . chlorhexidine  15 mL Mouth Rinse BID  . chlorhexidine gluconate (MEDLINE KIT)  15 mL Mouth Rinse BID  . Chlorhexidine Gluconate Cloth  6 each Topical Q0600  . darbepoetin (ARANESP) injection - DIALYSIS  150 mcg Intravenous Q Mon-HD  . feeding supplement (PRO-STAT SUGAR FREE 64)  60 mL Per Tube BID  . FLUoxetine  20 mg Per Tube Daily  . free water  100 mL Per Tube Q4H  . insulin aspart  0-20 Units Subcutaneous Q4H  . insulin aspart  7 Units Subcutaneous Q4H  . insulin detemir  40 Units Subcutaneous BID  . mouth rinse  15 mL Mouth Rinse q12n4p  . midodrine  10 mg Per Tube Q8H  . pantoprazole sodium  40 mg Per Tube Q1200  . polyethylene glycol  17 g Per Tube Daily  . sevelamer carbonate  2.4 g Per Tube TID WC  . sodium chloride flush  10-40 mL Intracatheter Q12H   Continuous Infusions: . sodium chloride Stopped (06/17/19 1357)  .  sodium chloride    . sodium chloride    . sodium chloride    . anticoagulant sodium citrate    . dextrose 50 mL/hr at 06/20/19 1400  . feeding supplement (VITAL 1.5 CAL) 1,000 mL (06/20/19 1614)  . fentaNYL infusion INTRAVENOUS Stopped (06/18/19 1241)  . norepinephrine (LEVOPHED) Adult infusion 10 mcg/min (06/20/19 1423)   PRN Meds:.sodium chloride, sodium chloride, sodium chloride, ALPRAZolam, anticoagulant sodium citrate, bisacodyl, docusate, fentaNYL (SUBLIMAZE) injection, lidocaine (PF), lidocaine-prilocaine, lip balm, loperamide HCl, LORazepam, midazolam, ondansetron (ZOFRAN) IV, pentafluoroprop-tetrafluoroeth, sodium chloride, sodium chloride flush Allergies  Allergen Reactions  . Heparin Other (See Comments)    Heparin induced thrombocytopenia. 2/15 HIT OD 1.692. 2/16 SRA positive-90.     Review of Systems patient unable to speak  Physical Exam Well developed male, awake, alert, makes eye contact and attempts to interact. CV rrr  No m/r/g Resp on vent, patient working to manage secretions. Abdomen soft, nt, nd Lower extremities with bandaged heel wounds.  Vital Signs: BP 97/66   Pulse 94   Temp 98.5 F (36.9 C) (Axillary)   Resp (!) 28   Ht _0  (1.854 m)   Wt 104.1 kg   SpO2 100%   BMI 30.28 kg/m  Pain Scale: Faces POSS *See Group Information*: S-Acceptable,Sleep, easy to arouse Pain Score: 0-No pain   SpO2: SpO2: 100 % O2 Device:SpO2: 100 % O2 Flow Rate: .O2 Flow Rate (L/min): 5 L/min  IO: Intake/output  summary:   Intake/Output Summary (Last 24 hours) at 06/20/2019 1650 Last data filed at 06/20/2019 1400 Gross per 24 hour  Intake 2440.24 ml  Output 1117 ml  Net 1323.24 ml    LBM: Last BM Date: 06/20/19 Baseline Weight: Weight: 124.2 kg Most recent weight: Weight: 104.1 kg     Palliative Assessment/Data: 20%     Time In: 1:00 Time Out: 2:00 Time Total: 60 min. Visit consisted of counseling and education dealing with the complex and emotionally  intense issues surrounding the need for palliative care and symptom management in the setting of serious and potentially life-threatening illness. Greater than 50%  of this time was spent counseling and coordinating care related to the above assessment and plan.  Signed by: Florentina Jenny, PA-C Palliative Medicine  Please contact Palliative Medicine Team phone at [phone] for questions and concerns.  For individual provider: See Shea Evans

## 2019-06-21 DIAGNOSIS — R579 Shock, unspecified: Secondary | ICD-10-CM | POA: Diagnosis not present

## 2019-06-21 DIAGNOSIS — I63312 Cerebral infarction due to thrombosis of left middle cerebral artery: Secondary | ICD-10-CM | POA: Diagnosis not present

## 2019-06-21 DIAGNOSIS — J9601 Acute respiratory failure with hypoxia: Secondary | ICD-10-CM | POA: Diagnosis not present

## 2019-06-21 DIAGNOSIS — R042 Hemoptysis: Secondary | ICD-10-CM | POA: Diagnosis not present

## 2019-06-21 LAB — RENAL FUNCTION PANEL
Albumin: 2.6 g/dL — ABNORMAL LOW (ref 3.5–5.0)
Albumin: 2.6 g/dL — ABNORMAL LOW (ref 3.5–5.0)
Anion gap: 15 (ref 5–15)
Anion gap: 13 (ref 5–15)
BUN: 34 mg/dL — ABNORMAL HIGH (ref 6–20)
BUN: 43 mg/dL — ABNORMAL HIGH (ref 6–20)
CO2: 23 mmol/L (ref 22–32)
CO2: 23 mmol/L (ref 22–32)
Calcium: 9.2 mg/dL (ref 8.9–10.3)
Calcium: 9 mg/dL (ref 8.9–10.3)
Chloride: 97 mmol/L — ABNORMAL LOW (ref 98–111)
Chloride: 98 mmol/L (ref 98–111)
Creatinine, Ser: 2.82 mg/dL — ABNORMAL HIGH (ref 0.61–1.24)
Creatinine, Ser: 3.34 mg/dL — ABNORMAL HIGH (ref 0.61–1.24)
GFR calc Af Amer: 32 mL/min — ABNORMAL LOW (ref >60)
GFR calc Af Amer: 26 mL/min — ABNORMAL LOW (ref >60)
GFR calc non Af Amer: 28 mL/min — ABNORMAL LOW (ref >60)
GFR calc non Af Amer: 23 mL/min — ABNORMAL LOW (ref >60)
Glucose, Bld: 121 mg/dL — ABNORMAL HIGH (ref 70–99)
Glucose, Bld: 133 mg/dL — ABNORMAL HIGH (ref 70–99)
Phosphorus: 2.9 mg/dL (ref 2.5–4.6)
Phosphorus: 3.3 mg/dL (ref 2.5–4.6)
Potassium: 3.9 mmol/L (ref 3.5–5.1)
Potassium: 4.1 mmol/L (ref 3.5–5.1)
Sodium: 135 mmol/L (ref 135–145)
Sodium: 134 mmol/L — ABNORMAL LOW (ref 135–145)

## 2019-06-21 LAB — GLUCOSE, CAPILLARY
Glucose-Capillary: 126 mg/dL — ABNORMAL HIGH (ref 70–99)
Glucose-Capillary: 133 mg/dL — ABNORMAL HIGH (ref 70–99)
Glucose-Capillary: 154 mg/dL — ABNORMAL HIGH (ref 70–99)
Glucose-Capillary: 154 mg/dL — ABNORMAL HIGH (ref 70–99)
Glucose-Capillary: 77 mg/dL (ref 70–99)
Glucose-Capillary: 78 mg/dL (ref 70–99)

## 2019-06-21 LAB — APTT
aPTT: 53 seconds — ABNORMAL HIGH (ref 24–36)
aPTT: 34 seconds (ref 24–36)
aPTT: 63 seconds — ABNORMAL HIGH (ref 24–36)

## 2019-06-21 LAB — CBC
HCT: 30.6 % — ABNORMAL LOW (ref 39.0–52.0)
Hemoglobin: 8.9 g/dL — ABNORMAL LOW (ref 13.0–17.0)
MCH: 30.6 pg (ref 26.0–34.0)
MCHC: 29.1 g/dL — ABNORMAL LOW (ref 30.0–36.0)
MCV: 105.2 fL — ABNORMAL HIGH (ref 80.0–100.0)
Platelets: 190 10*3/uL (ref 150–400)
RBC: 2.91 MIL/uL — ABNORMAL LOW (ref 4.22–5.81)
RDW: 19.7 % — ABNORMAL HIGH (ref 11.5–15.5)
WBC: 10.1 10*3/uL (ref 4.0–10.5)
nRBC: 0.5 % — ABNORMAL HIGH (ref 0.0–0.2)

## 2019-06-21 LAB — MAGNESIUM: Magnesium: 2.3 mg/dL (ref 1.7–2.4)

## 2019-06-21 LAB — PROTIME-INR
INR: 1.2 (ref 0.8–1.2)
Prothrombin Time: 14.7 seconds (ref 11.4–15.2)

## 2019-06-21 MED ORDER — CHLORHEXIDINE GLUCONATE CLOTH 2 % EX PADS
6.0000 | MEDICATED_PAD | Freq: Every day | CUTANEOUS | Status: DC
  Administered 2019-06-21 – 2019-06-25 (×4): 6 via TOPICAL

## 2019-06-21 MED ORDER — SODIUM CHLORIDE 0.9 % IV SOLN
0.0520 mg/kg/h | INTRAVENOUS | Status: AC
  Administered 2019-06-21: 0.048 mg/kg/h via INTRAVENOUS
  Filled 2019-06-21: qty 250

## 2019-06-21 MED ORDER — AMIODARONE HCL 200 MG PO TABS
200.0000 mg | ORAL_TABLET | Freq: Every day | ORAL | Status: DC
  Administered 2019-06-22 – 2019-06-28 (×6): 200 mg
  Filled 2019-06-21 (×7): qty 1

## 2019-06-21 NOTE — Progress Notes (Signed)
  Speech Language Pathology Treatment: Nada Boozer Speaking valve;Cognitive-Linquistic  Patient Details Name: Carlos Brewer. MRN: [id number] DOB: Jan 07, 1986 Today's Date: 06/21/2019 Time: 9794-8016 SLP Time Calculation (min) (ACUTE ONLY): 15 min  Assessment / Plan / Recommendation Clinical Impression  Pt is alert today, still with blood coming from his trach but cuff deflated at baseline. Pt at baseline and throughout PMV placement exhibits intermittent periods of tachypnea that lasts for several seconds at a time before resolving. This does not appear to be alleviated by PMV removal and there is no evidence of back pressure. Upon initial placement, pt repeated words x2 ("hi" and "hello"), and said his first and last name. No further verbalizations were observed despite Max cues for word generation, repetition at the word level, and automatic speech tasks. Pt did repeat speech sounds (phonemic level) x2. Pt should continue to wear PMV intermittently with supervision from staff. PO trials on hold today as pt is planned to have PEG, but SLP will continue to follow for readiness.    HPI HPI: 34 yo male smoker found to have slurred speech and Rt sided weakness.  CT head showed M2 occlusion.  Ultimately treated with thrombolytic and thrombectomy by IR.  Required intubation for airway protection. Course complicated by septic shock, AKI requiring CRRT and polymorphic VT. Intubated 2/5-2/18.      SLP Plan  Continue with current plan of care       Recommendations         Patient may use Passy-Muir Speech Valve: Intermittently with supervision PMSV Supervision: Full         Oral Care Recommendations: Oral care QID Follow up Recommendations: LTACH;Skilled Nursing facility SLP Visit Diagnosis: Aphonia (R49.1);Aphasia (R47.01) Plan: Continue with current plan of care       GO                Osie Bond., M.A. [facility] Pager [phone] Office  [phone]  06/21/2019, 10:26 AM

## 2019-06-21 NOTE — Progress Notes (Signed)
[facility] NEPHROLOGY PROGRESS NOTE  Assessment/ Plan: Pt is a 34 y.o. yo male with history of CHF, myocarditis, and NICM, stroke, hospital course complicated by septic shock, HCAP and AKI requiring CRRT.  #Acute kidney injury multifactorial etiology including ATN following cardiogenic/septic shock; Discontinue CRRT on 4/4.  Tolerated intermittent hemodialysis on 4/5.  No sign of renal recovery so far.  Plan for next HD tomorrow. Currently on Levophed. Overall prognosis is poor given no improvement in encephalopathy and remains vent and dialysis dependent.  Palliative care has seen the patient.  #Acute stroke: Likely embolic with bilateral infarcts and LV thrombus.  Has residual encephalopathy.  #Hypoxic respiratory failure, prolonged ventilator dependent, has tracheostomy.  Pulmonary following.  #Shock: Requiring levo. Weaning as tolerated.  #Anemia: Continue ESA.  No Iron because of concern of infection.  # CKD-MBD; on Renvela.  Phosphorus level acceptable. PTH 70.  Subjective: Seen and examined at bedside.  No new event.  Had dialysis yesterday with 917 cc UF.  Remains on low-dose Levophed. Objective Vital signs in last 24 hours: Vitals:   06/21/19 0730 06/21/19 0735 06/21/19 0745 06/21/19 0800  BP: 105/77     Pulse: 87 86 89   Resp: (!) 33 16 (!) 33   Temp:    98.2 F (36.8 C)  TempSrc:    Axillary  SpO2: 96% 100% 100%   Weight:      Height:       Weight change: -0.7 kg  Intake/Output Summary (Last 24 hours) at 06/21/2019 0902 Last data filed at 06/21/2019 0600 Gross per 24 hour  Intake 2400.24 ml  Output 917 ml  Net 1483.24 ml       Labs: Basic Metabolic Panel: Recent Labs  Lab 06/20/19 0623 06/20/19 1506 06/21/19 0611  NA 139 136 135  K 3.7 3.9 3.9  CL 100 100 97*  CO2 23 23 23   GLUCOSE 96 146* 121*  BUN 46* 17 34*  CREATININE 3.04* 1.71* 2.82*  CALCIUM 8.5* 8.7* 9.2  PHOS 3.1 1.4* 2.9   Liver Function Tests: Recent Labs  Lab  06/20/19 0623 06/20/19 1506 06/21/19 0611  ALBUMIN 2.4* 2.4* 2.6*   No results for input(s): LIPASE, AMYLASE in the last 168 hours. No results for input(s): AMMONIA in the last 168 hours. CBC: Recent Labs  Lab 06/17/19 0850 06/17/19 0850 06/18/19 1112 06/18/19 1112 06/19/19 1120 06/20/19 0623 06/21/19 0611  WBC 13.0*   < > 8.3   < > 8.1 10.4 10.1  NEUTROABS 11.1*  --   --   --   --   --   --   HGB 10.4*   < > 9.8*   < > 9.0* 8.0* 8.9*  HCT 33.7*   < > 34.1*   < > 31.0* 27.6* 30.6*  MCV 106.0*  --  105.9*  --  106.9* 108.7* 105.2*  PLT 332   < > 241   < > 209 224 190   < > = values in this interval not displayed.   Cardiac Enzymes: No results for input(s): CKTOTAL, CKMB, CKMBINDEX, TROPONINI in the last 168 hours. CBG: Recent Labs  Lab 06/20/19 1532 06/20/19 2004 06/20/19 2341 06/21/19 0341 06/21/19 0806  GLUCAP 136* 162* 124* 154* 133*    Iron Studies: No results for input(s): IRON, TIBC, TRANSFERRIN, FERRITIN in the last 72 hours. Studies/Results: DG Chest Port 1 View  Result Date: 06/20/2019 CLINICAL DATA:  Respiratory failure, hypoxia EXAM: PORTABLE CHEST 1 VIEW COMPARISON:  06/17/2019 FINDINGS: Interval  removal of enteric feeding tube and placement of esophagogastric tube, tip and side port below the diaphragm. Near complete interval resolution of previously noted pneumomediastinum. Tracheostomy. Unchanged cardiomegaly. Diffuse interstitial and heterogeneous airspace opacity, not significantly changed. IMPRESSION: 1. Interval removal of enteric feeding tube and placement of esophagogastric tube, tip and side port below the diaphragm. 2. Near complete interval resolution of previously noted pneumomediastinum. 3. Unchanged cardiomegaly and diffuse interstitial heterogeneous airspace opacity, consistent with edema, infection, and/or ARDS. No new airspace opacity. Electronically Signed   By: Eddie Candle M.D.   On: 06/20/2019 11:19    Medications: Infusions: . sodium  chloride Stopped (06/17/19 1357)  . sodium chloride    . sodium chloride    . sodium chloride    . anticoagulant sodium citrate    . dextrose 50 mL/hr at 06/21/19 0600  . feeding supplement (VITAL 1.5 CAL) Stopped (06/21/19 0000)  . fentaNYL infusion INTRAVENOUS Stopped (06/18/19 1241)  . norepinephrine (LEVOPHED) Adult infusion 10 mcg/min (06/21/19 0600)    Scheduled Medications: . amiodarone  200 mg Per Tube BID  . ARIPiprazole  10 mg Per Tube Daily  . aspirin  81 mg Per Tube Daily  . atorvastatin  40 mg Per Tube q1800  . chlorhexidine  15 mL Mouth Rinse BID  . chlorhexidine gluconate (MEDLINE KIT)  15 mL Mouth Rinse BID  . Chlorhexidine Gluconate Cloth  6 each Topical Q0600  . darbepoetin (ARANESP) injection - DIALYSIS  150 mcg Intravenous Q Mon-HD  . feeding supplement (PRO-STAT SUGAR FREE 64)  60 mL Per Tube BID  . FLUoxetine  20 mg Per Tube Daily  . free water  100 mL Per Tube Q4H  . insulin aspart  0-20 Units Subcutaneous Q4H  . insulin aspart  7 Units Subcutaneous Q4H  . insulin detemir  40 Units Subcutaneous BID  . mouth rinse  15 mL Mouth Rinse q12n4p  . midodrine  10 mg Per Tube Q8H  . pantoprazole sodium  40 mg Per Tube Q1200  . polyethylene glycol  17 g Per Tube Daily  . sevelamer carbonate  2.4 g Per Tube TID WC  . sodium chloride flush  10-40 mL Intracatheter Q12H    have reviewed scheduled and prn medications.  Physical Exam: General: On trach/vent, not in distress, opens eyes with the name only Heart:RRR, s1s2 nl Lungs: Coarse breath sound bilateral Abdomen:soft, Non-tender, non-distended Extremities: Trace LE edema Dialysis Access: Right IJ TDC placed by IR on 3/2.  Aashvi Rezabek Tanna Furry 06/21/2019,9:02 AM  LOS: 60 days  Pager: [phone]

## 2019-06-21 NOTE — Progress Notes (Signed)
The chaplain visited as a result of rounding on the unit. The chaplain noticed the mother sitting in the room with her head down. The mother expressed to the chaplain that she had spoke with a palliative care nurse and found comfort in that. The chaplain was present with the mother as she prayed for the patient. The chaplain will continue to follow this patient.  Brion Aliment Chaplain Resident For questions concerning this note please contact me by pager [phone]

## 2019-06-21 NOTE — Progress Notes (Signed)
ANTICOAGULATION CONSULT NOTE  Pharmacy Consult:  Bivalirudin  Indication: stroke 2/5, LV thrombus 2/8, HIT 2/15  Allergies  Allergen Reactions  . Heparin Other (See Comments)    Heparin induced thrombocytopenia. 2/15 HIT OD 1.692. 2/16 SRA positive-90.     Patient Measurements: Height: 6\' 1"  (185.4 cm) Weight: 106.1 kg (233 lb 14.5 oz) IBW/kg (Calculated) : 79.9   Vital Signs: Temp: 98 F (36.7 C) (04/06 2000) Temp Source: Axillary (04/06 2000) BP: 117/78 (04/06 2300) Pulse Rate: 89 (04/06 2333)  Labs: Recent Labs    06/19/19 0425 06/19/19 0425 06/19/19 1120 06/19/19 1615 06/20/19 3614 06/20/19 4315 06/20/19 4008 06/20/19 0658 06/20/19 1506 06/21/19 0611 06/21/19 1624 06/21/19 1846 06/21/19 2309  HGB  --    < > 9.0*  --  8.0*  --   --   --   --  8.9*  --   --   --   HCT  --   --  31.0*  --  27.6*  --   --   --   --  30.6*  --   --   --   PLT  --   --  209  --  224  --   --   --   --  190  --   --   --   APTT 55*   < >  --   --   --   --  65*   < >  --   --  53* 34 63*  LABPROT 19.0*  --   --   --   --   --  18.6*  --   --  14.7  --   --   --   INR 1.6*  --   --   --   --   --  1.6*  --   --  1.2  --   --   --   CREATININE 1.37*   < > 1.67*   < > 3.04*   < >  --   --  1.71* 2.82* 3.34*  --   --    < > = values in this interval not displayed.    Estimated Creatinine Clearance: 39.5 mL/min (A) (by C-G formula based on SCr of 3.34 mg/dL (H)).  Assessment: 34 yr old male with history of ESRD on HD presented with left MCA infarct - received TPA and revascularization with IR on 2/5. On 2/8 found to have small LV apical thrombus on ECHO. Pharmacy consulted to dose IV heparin, which was switched to bivalirudin 2/15 for HIT. 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 SRA very positive at 22. Heparin allergy has appropriately been added to chart and updated with SRA results. Transitioned off bivalrudin on 3/14. Restarted bivalirudin 3/26 and  briefly held on 3/31 for chest tube placement.   Bivalirudin was held yesterday due to significant bleeding from trach and patient was intended to receive PEG today. PEG placement has been delayed at this time. First shift pharmacist discussed with Dr. Vaughan Browner and resumed bivalirudin this afternoon targeting the lower end of the therapeutic range. Patient previosuly therapeutic on bivalirudin 0.05mg /kg/hr using a weight of 105kg. Hgb 8.9. Plt 190. Bleeding at trach site was still present but improved.   APTT remains therapeutic at 63 on rate of 0.052 mg/kg/hr of bivalrudin.    Goal of Therapy:  APTT 50-65 seconds per discussion with Dr. Vaughan Browner Monitor platelets by anticoagulation protocol: Yes  Plan:  Continue bivalrudin infusion at 0.052 mg/kg/hr (using current weight of 106.1kg) Monitor bleeding from trach site  Monitor aPTT, CBC, and S/S of bleeding daily  Follow up plans for PEG placement   Sherlon Handing, PharmD, BCPS Please see amion for complete clinical pharmacist phone list 06/21/2019 11:56 PM

## 2019-06-21 NOTE — Progress Notes (Addendum)
ANTICOAGULATION CONSULT NOTE  Pharmacy Consult:  Bivalirudin  Indication: stroke 2/5, LV thrombus 2/8, HIT 2/15  Allergies  Allergen Reactions  . Heparin Other (See Comments)    Heparin induced thrombocytopenia. 2/15 HIT OD 1.692. 2/16 SRA positive-90.     Patient Measurements: Height: 6\' 1"  (185.4 cm) Weight: 106.1 kg (233 lb 14.5 oz) IBW/kg (Calculated) : 79.9   Vital Signs: Temp: 98.2 F (36.8 C) (04/06 0800) Temp Source: Axillary (04/06 0800) BP: 103/78 (04/06 1130) Pulse Rate: 90 (04/06 1145)  Labs: Recent Labs    06/19/19 0425 06/19/19 0425 06/19/19 1120 06/19/19 1615 06/20/19 0623 06/20/19 0658 06/20/19 1506 06/21/19 0611  HGB  --    < > 9.0*  --  8.0*  --   --  8.9*  HCT  --   --  31.0*  --  27.6*  --   --  30.6*  PLT  --   --  209  --  224  --   --  190  APTT 55*  --   --   --   --  65*  --   --   LABPROT 19.0*  --   --   --   --  18.6*  --  14.7  INR 1.6*  --   --   --   --  1.6*  --  1.2  CREATININE 1.37*   < > 1.67*   < > 3.04*  --  1.71* 2.82*   < > = values in this interval not displayed.    Estimated Creatinine Clearance: 46.7 mL/min (A) (by C-G formula based on SCr of 2.82 mg/dL (H)).  Assessment: 34 yr old male with history of ESRD on HD presented with left MCA infarct - received TPA and revascularization with IR on 2/5. On 2/8 found to have small LV apical thrombus on ECHO. Pharmacy consulted to dose IV heparin, which was switched to bivalirudin 2/15 for HIT. 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 SRA very positive at 67. Heparin allergy has appropriately been added to chart and updated with SRA results. Transitioned off bivalrudin on 3/14. Restarted bivalirudin 3/26 and briefly held on 3/31 for chest tube placement.   Bivalirudin was held yesterday due to significant bleeding from trach and patient was intended to receive PEG today. PEG placement has been delayed and will not be placed today. Spoke with Dr. Vaughan Browner  and will resume bivalirudin targeting the lower end of the therapeutic range. Patient previosuly therapeutic on bivalirudin 0.05mg /kg/hr using a weight of 105kg. Hgb 8.9. Plt 190. Bleeding at trach site still present but improved.    Goal of Therapy:  APTT 50-65 seconds per discussion with Dr. Vaughan Browner Monitor platelets by anticoagulation protocol: Yes   Plan:  Resume bivalirudin infusion at 0.048 mg/kg/hr (using current weight of 106.1kg) Check aPTT at 1630 Monitor bleeding from trach site  Monitor aPTT, CBC, and S/S of bleeding daily  Follow up plans for PEG placement   Cristela Felt, PharmD PGY1 Pharmacy Resident [employer]: [phone]

## 2019-06-21 NOTE — Progress Notes (Signed)
Physical Therapy Treatment Patient Details Name: Carlos Brewer. MRN: [id number] DOB: 1985/10/12 Today's Date: 06/21/2019    History of Present Illness Pt is a 34 y/o male smoker who initially presented on 2/5 with slurred speech and Rt sided weakness. Admitted with left MCA CVA and multiple smaller embolic infarcts s/p tPA and thrombectomy in IR.  Additionally found to have left ventricular  thrombus. Hospital course complicated by septic shock r/t HCAP, AKI requiring CRRT, polymorphic VT and cardiogenic shock. Echo EF < 20%. Pt with Intermittent pressor dependence, required tracheostomy. Tolerating trach collar 3/4, back on vent 3/5 with flash pulmonary edema.  Started IHD 3/10.    PT Comments    Pt more alert with increased command follow today x 4 extremities however with poor attn span and activity tolerance. Used egress to achieve upright sitting posture and worked on supported and unsupported sitting x 8 min. Pt remains on trach collar and ultimately dependent for all mobility and ADLs. Acute PT to cont to follow.   Follow Up Recommendations  LTACH     Equipment Recommendations  (TBD)    Recommendations for Other Services       Precautions / Restrictions Precautions Precautions: Fall Precaution Comments: trach collar, watch BP Restrictions Weight Bearing Restrictions: No    Mobility  Bed Mobility               General bed mobility comments: used egress to work on sitting as pt with fluctuating BP  Transfers                 General transfer comment: deferred today  Ambulation/Gait             General Gait Details: unable   Stairs             Wheelchair Mobility    Modified Rankin (Stroke Patients Only) Modified Rankin (Stroke Patients Only) Pre-Morbid Rankin Score: No symptoms Modified Rankin: Severe disability     Balance Overall balance assessment: Needs assistance Sitting-balance support: Bilateral upper extremity  supported;Feet supported Sitting balance-Leahy Scale: Zero Sitting balance - Comments: using egress pt able to sit with feet on the floor, required maxA to maintain EOB support without leaning back onto bed. pt tolerated 8 min. attempted to complete LE LAQ, pt with decreased attn to complete more than 2 AA reps Postural control: Posterior lean;Left lateral lean                                  Cognition Arousal/Alertness: Awake/alert Behavior During Therapy: Flat affect Overall Cognitive Status: Impaired/Different from baseline Area of Impairment: Attention;Following commands;Safety/judgement;Problem solving;Awareness                   Current Attention Level: Focused   Following Commands: Follows one step commands with increased time;Follows one step commands inconsistently(able to follow 2 simple commands then looses attn) Safety/Judgement: Decreased awareness of safety;Decreased awareness of deficits Awareness: Intellectual Problem Solving: Slow processing;Difficulty sequencing;Requires verbal cues;Requires tactile cues General Comments: pt with increased command following compared to prior sessions however pt with decreased attn limiting his command follow to 2 simple commands at a time. ie. pt would complete 2 quad sets and then would no longer follow, 5 min later when asked to do quad sets he would do 2 again      Exercises General Exercises - Lower Extremity Ankle Circles/Pumps: AROM;Both;Supine(3 sets of 2 reps) Quad Sets:  AROM;Both;Supine(3 sets of 2 reps) Long Arc Quad: AAROM;Both;Seated(3 sets of 2 reps)    General Comments General comments (skin integrity, edema, etc.): pt with some bleeding from trach, RN aware. BP up and down but unsure of accuracy      Pertinent Vitals/Pain Pain Assessment: Faces Faces Pain Scale: No hurt    Home Living                      Prior Function            PT Goals (current goals can now be found in the  care plan section) Progress towards PT goals: Progressing toward goals    Frequency    Min 3X/week      PT Plan Current plan remains appropriate    Co-evaluation              AM-PAC PT "6 Clicks" Mobility   Outcome Measure  Help needed turning from your back to your side while in a flat bed without using bedrails?: Total Help needed moving from lying on your back to sitting on the side of a flat bed without using bedrails?: Total Help needed moving to and from a bed to a chair (including a wheelchair)?: Total Help needed standing up from a chair using your arms (e.g., wheelchair or bedside chair)?: Total Help needed to walk in hospital room?: Total Help needed climbing 3-5 steps with a railing? : Total 6 Click Score: 6    End of Session Equipment Utilized During Treatment: Oxygen Activity Tolerance: Patient limited by fatigue Patient left: in bed;with call bell/phone within reach;with bed alarm set;with restraints reapplied Nurse Communication: Mobility status PT Visit Diagnosis: Other abnormalities of gait and mobility (R26.89);Other symptoms and signs involving the nervous system (R29.898);Muscle weakness (generalized) (M62.81)     Time: 1694-5038 PT Time Calculation (min) (ACUTE ONLY): 29 min  Charges:  $Therapeutic Exercise: 8-22 mins $Therapeutic Activity: 8-22 mins                     Kittie Plater, PT, DPT Acute Rehabilitation Services Pager #: [phone] Office #: [phone]    Carlos Brewer 06/21/2019, 1:09 PM

## 2019-06-21 NOTE — Progress Notes (Addendum)
ANTICOAGULATION CONSULT NOTE  Pharmacy Consult:  Bivalirudin  Indication: stroke 2/5, LV thrombus 2/8, HIT 2/15  Allergies  Allergen Reactions  . Heparin Other (See Comments)    Heparin induced thrombocytopenia. 2/15 HIT OD 1.692. 2/16 SRA positive-90.     Patient Measurements: Height: 6\' 1"  (185.4 cm) Weight: 106.1 kg (233 lb 14.5 oz) IBW/kg (Calculated) : 79.9   Vital Signs: Temp: 98.2 F (36.8 C) (04/06 1559) Temp Source: Axillary (04/06 1559) BP: 99/65 (04/06 1500) Pulse Rate: 87 (04/06 1509)  Labs: Recent Labs    06/19/19 0425 06/19/19 0425 06/19/19 1120 06/19/19 1615 06/20/19 0623 06/20/19 0623 06/20/19 0658 06/20/19 1506 06/21/19 0611 06/21/19 1624  HGB  --    < > 9.0*  --  8.0*  --   --   --  8.9*  --   HCT  --   --  31.0*  --  27.6*  --   --   --  30.6*  --   PLT  --   --  209  --  224  --   --   --  190  --   APTT 55*  --   --   --   --   --  65*  --   --  53*  LABPROT 19.0*  --   --   --   --   --  18.6*  --  14.7  --   INR 1.6*  --   --   --   --   --  1.6*  --  1.2  --   CREATININE 1.37*   < > 1.67*   < > 3.04*   < >  --  1.71* 2.82* 3.34*   < > = values in this interval not displayed.    Estimated Creatinine Clearance: 39.5 mL/min (A) (by C-G formula based on SCr of 3.34 mg/dL (H)).  Assessment: 34 yr old male with history of ESRD on HD presented with left MCA infarct - received TPA and revascularization with IR on 2/5. On 2/8 found to have small LV apical thrombus on ECHO. Pharmacy consulted to dose IV heparin, which was switched to bivalirudin 2/15 for HIT. 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 SRA very positive at 43. Heparin allergy has appropriately been added to chart and updated with SRA results. Transitioned off bivalrudin on 3/14. Restarted bivalirudin 3/26 and briefly held on 3/31 for chest tube placement.   Bivalirudin was held yesterday due to significant bleeding from trach and patient was intended to  receive PEG today. PEG placement has been delayed at this time. First shift pharmacist discussed with Dr. Vaughan Browner and resumed bivalirudin this afternoon targeting the lower end of the therapeutic range. Patient previosuly therapeutic on bivalirudin 0.05mg /kg/hr using a weight of 105kg. Hgb 8.9. Plt 190. Bleeding at trach site was still present but improved.   APTT is therapeutic at 53 on rate of 0.048 mg/kg/hr of bivalrudin.  Will continue at this rate and check confirmatory aPTT in 2 hours.    Goal of Therapy:  APTT 50-65 seconds per discussion with Dr. Vaughan Browner Monitor platelets by anticoagulation protocol: Yes   Plan:  Continue bivalrudin infusion at 0.048 mg/kg/hr (using current weight of 106.1kg) Confirm aPTT at 1830.  Monitor bleeding from trach site  Monitor aPTT, CBC, and S/S of bleeding daily  Follow up plans for PEG placement   Sloan Leiter, PharmD, BCPS, BCCCP Clinical Pharmacist Please refer to [facility] for [facility]  Pharmacy numbers 5:01 PM, 06/21/2019   Addendum: Repeat aPTT is now low at 34 and no issues with infusion or stops of infusion per RN. No bleeding noted.    Plan:  Increase bivalrudin infusion to 0.052 mg/kg/hr.  Recheck aPTT in 2 hours.   Sloan Leiter, PharmD, BCPS, BCCCP Clinical Pharmacist Please refer to [facility] for [place] numbers 06/21/2019, 8:51 PM

## 2019-06-21 NOTE — TOC Progression Note (Signed)
Transition of Care (TOC) - Progression Note    Patient Details  Name: Carlos Brewer. MRN: [id number] Date of Birth: May 19, 1985  Transition of Care [facility]) CM/SW Contact  [name], RN Phone Number: 06/21/2019, 3:44 PM  Clinical Narrative:   Received call from April Alexander at the [facility] and provided update.  Contact for placement is Geannie Risen [phone] ext [phone] at [facility].     Expected Discharge Plan: Long Term Acute Care (LTAC) Barriers to Discharge: Continued Medical Work up  Expected Discharge Plan and Services Expected Discharge Plan: [place] (LTAC)                                               Social Determinants of Health (SDOH) Interventions    Readmission Risk Interventions No flowsheet data found.

## 2019-06-21 NOTE — Progress Notes (Addendum)
NAME:  Carlos Brewer., MRN:  [id number], DOB:  August 28, 1985, LOS: 45 ADMISSION DATE:  05/11/2019, CONSULTATION DATE:  04/25/2019 REFERRING MD:  Dr. Leonel Ramsay, CHIEF COMPLAINT:  Slurred speech  Brief History   34 yo male smoker found to have slurred speech and Rt sided weakness.  Admitted with left MCA CVA and multiple smaller embolic infarcts>>>  tPA and thrombectomy in IR.  Additionally found to have left ventricular  thrombus.    Course complicated by septic shock r/t HCAP, AKI requiring CRRT, polymorphic VT and cardiogenic shock.  Intermittent pressor dependence, required tracheostomy Intermittent hemodialysis [place] CVA Heart failure with reduced ejection fraction < 15% on last echocardiogram  Past Medical History  Systolic CHF with non ischemic CM, Cocaine abuse, OSA, DM Hx of CHF (EF 15%)  Hx myocarditis Smoker 1/2 ppd   Significant Hospital Events   2/05 Admit, tPA, IR thrombectomy 2/6 100% on fvent at 2300  Overnight requiring increasing levophed and phenylephrine.    2/6: switched to levophed, epi, started antibiotics, started on CRRT.  2/9  developed wide-complex tachycardia and hypotension, started on amiodarone drip, increase Levophed drip 2/15 drop in platelets, heparin stopped, bival started 2/17 Hypotensive and back on Levophed 2/18 trach, lines changed 2/19 started milrinone , fever 103 2/20 Episode of wide-complex tachycardia yesterday, changed from Levophed to vasopressin 2/22 stopping vanc. Still on inotrope support. Some AF w/ RVR. Had to be placed back on pressors. ivabradine added 2/23 still on pressors/ CRRT continued 2/24 tmax 98.4, Remains on CRRT, even UF, remains anuric, Levophed at 14 mcg/min, Milrinone at 0.125 mcg/kg/min, Coox 63.4 Doing well this morning on ATC, no events overnight.  Midodrine added   2/25 more interactive, remains on ATC, tmax 99.5/ WBC 21.4, ~900 ml liquid stool/ 24 hours, neg Cdiff, Levophed up to 20 mcg/min, CVP 2,  ,milrinone remains at 0.125 mcg/kg/min, coox 92.7, Off CRRT since last night ~2000 s/p clotted x 3 off citrate, restarted  3/1 Amio drip restarted for WCT/atrial flutter, milrinone turned off 3/2 CRRT stopped, permacath placed 3/5 crrt resumed 3/6 started on pressors for crrt tolerance 3/8 No longer on CRRT or pressor support  3/12 Tolerated PS for >2 hours  3/17  febrile 106 overnight, shock on pressors, new lines placed, broad-spectrum antibiotics 3/22: Awake, appears comfortable on pressure support ventilation. weaning Precedex, he is back on low-dose norepinephrine 3/23 pm back on vent at hs for resp distress / desats ? chf on cxr  3/24 t collar 24/7 3/29 tolerating ATM without difficulty 3/31 tr back to ICU -trach changed to 6 cuffless, bronched, new fever, started on antibiotics, pneumomediastinum small biapical pneumothoraces-stable 4/1 CRRT transient 4/5 started back on HD tiw  4/6 reduced amiodarone to 200 mg daily   Consults:  Neuro IR Cardiology  Nephrology Heart Failure  EP   Procedures:  2/5-2/6:  TPA given at 428 am (total of 90 Mg) 520 am went to IR  S/P Lt common carotid arteriogram followed by complete revascularization of occluded LT MCA sup division mid M2 seg with x 1 pass with 56mx 40 mm solitaire X ret river device and penumbra aspiration with TICI 3 revascularization  ETT 2/05 >> 2/18 [facility] CVL 2/5 >>2/18  RT [place] CVL 2/18 >>3/2 RIJ HD cath 2/6 >>2/18 6 shiley cuffed trach 2/18  >>3/4  RT fem HD 2/19 >>3/2 RIJ permacath 3/2>> 3/4 shiley 4 uncuffed.... dislodged placed back 6cuffed shiley that evening 3/5: change to #6 cuffed distal XLT  3/17 right  femoral CVL >> 3/23 3/17 right femoral arterial line >>3/23 3/31 bronchoscopy : airway clear, conclusion = end of trach up against the wall obstructing airflow   3/31 trach change to cuffed #6 XLT  4/5 back on t collar 24/7  And still levophed dep  4/6  PEG planned  Significant Diagnostic Tests:  CT  angio head/neck 2/05 >> occlusion of Lt MCA bifurcation Echo 2/05 >> EF less than 20%, cannot rule out apical thrombus MRI brain 2/11 > extensive acute infarction of multiple areas without large or medium vessel occlusion Echocardiogram 2/8 Left ventricular ejection fraction, by estimation, is <20%. The left  ventricle has severely decreased function. The left ventricle demonstrates  global hypokinesis. The left ventricular internal cavity size was severely  dilated. Possible small 0.8 x 0.6 cm apical thrombus.   3/16 duplex upper and lower extremities >>neg  3/17 head CT >.  New area of hypoattenuation in the left pons , left frontal encephalomalacia related to old infarct 3/31CT chestreveals extensive pneumomediastinum and subcutaneous emphysema in the lower neck.  There is less than 5% bilateral pneumothoraces.  Extensive airspace disease right greater than left consistent with ARDS or infection  3/31 head CT >> neg for new pathology Micro Data:  SARS CoV2 PCR 2/05 >> negative Influenza PCR 2/05 >> negative ... Cdiff 2/25 >> neg Trach asp 3/11>> Few candida albicans BC 3/11>> 1 of 4 with Coag neg staph  BC 3/12 x 2 > neg  BC 3/17 x 2 > neg   trach 3/24 >  no  wbc, no organisms final - nl flora  Bronchoscopy 3/31  moderate GPC, normal respiratory flora Blood cultures 3/31 Neg    Antimicrobials:  mero 2/6 -2/12 Zosyn 2/6 , 2/19 >> 2/23 vanc 2/6 >> 2/9 , 2/19 >>2/22 Ceftriaxone 3/12> 3/16 3/17 vanc >> 3/19 3/17 Anidulafungin >> 3/19 3/17 cefepime >>  3/22  3/31 vancomycin>>4/3 3/31 cefepime>>4/3 Unasyn 4/3>>4/4   Scheduled Meds: . amiodarone  200 mg Per Tube BID  . ARIPiprazole  10 mg Per Tube Daily  . aspirin  81 mg Per Tube Daily  . atorvastatin  40 mg Per Tube q1800  . chlorhexidine  15 mL Mouth Rinse BID  . chlorhexidine gluconate (MEDLINE KIT)  15 mL Mouth Rinse BID  . Chlorhexidine Gluconate Cloth  6 each Topical Q0600  . darbepoetin (ARANESP) injection -  DIALYSIS  150 mcg Intravenous Q Mon-HD  . feeding supplement (PRO-STAT SUGAR FREE 64)  60 mL Per Tube BID  . FLUoxetine  20 mg Per Tube Daily  . free water  100 mL Per Tube Q4H  . insulin aspart  0-20 Units Subcutaneous Q4H  . insulin aspart  7 Units Subcutaneous Q4H  . insulin detemir  40 Units Subcutaneous BID  . mouth rinse  15 mL Mouth Rinse q12n4p  . midodrine  10 mg Per Tube Q8H  . pantoprazole sodium  40 mg Per Tube Q1200  . polyethylene glycol  17 g Per Tube Daily  . sevelamer carbonate  2.4 g Per Tube TID WC  . sodium chloride flush  10-40 mL Intracatheter Q12H   Continuous Infusions: . sodium chloride Stopped (06/17/19 1357)  . sodium chloride    . sodium chloride    . sodium chloride    . anticoagulant sodium citrate    . dextrose 50 mL/hr at 06/21/19 0600  . feeding supplement (VITAL 1.5 CAL) Stopped (06/21/19 0000)  . fentaNYL infusion INTRAVENOUS Stopped (06/18/19 1241)  . norepinephrine (LEVOPHED)  Adult infusion 10 mcg/min (06/21/19 0600)   PRN Meds:.sodium chloride, sodium chloride, sodium chloride, ALPRAZolam, anticoagulant sodium citrate, bisacodyl, docusate, fentaNYL (SUBLIMAZE) injection, lidocaine (PF), lidocaine-prilocaine, lip balm, loperamide HCl, LORazepam, midazolam, ondansetron (ZOFRAN) IV, pentafluoroprop-tetrafluoroeth, sodium chloride, sodium chloride flush   Interim history/subjective:  Back on t collar since pm 4/5 / still on levophed 7 mcg More alert this am s increased wob   Objective   Blood pressure 105/77, pulse 89, temperature 98.2 F (36.8 C), temperature source Axillary, resp. rate (!) 33, height 6' 1"  (1.854 m), weight 106.1 kg, SpO2 100 %. CVP:  [15 mmHg] 15 mmHg  FiO2 (%):  [28 %] 28 %   Intake/Output Summary (Last 24 hours) at 06/21/2019 1008 Last data filed at 06/21/2019 0600 Gross per 24 hour  Intake 2400.24 ml  Output 917 ml  Net 1483.24 ml   Filed Weights   06/20/19 0645 06/20/19 1115 06/21/19 0401  Weight: 104.8 kg 104.1 kg  106.1 kg   CVP:  [15 mmHg] 15 mmHg    Physical Exam Tmax 98.5 Pt alert, approp nad @ 30 degrees hob/ T collar 0.28 FIO2  No jvd Neck supple Lungs with a minimal  rhonchi bilaterally RRR no s3 or or sign murmur Abd obese with limited  excursion / tf ok  Extr warm with no edema or clubbing noted/ pos pas Neuro  Sticks tongue out appears alert  Skin: L heal stage II       Resolved Hospital Problem list   HCAP 7/65, Acute metabolic encephalopathy 2nd to hypoxia and renal failure, Cardiogenic shock HIT   Assessment & Plan:   Acute on chronic respiratory failure , current worsening 3/31 attributed to aspiration Tracheostomy Pneumomediastinum, pneumothoraces> resolved by pCXR 4/5  -note component of Cheyne-Stokes breathing >>> back on t collar, much less bleeding off bivalrudin since am 4/5   Acute metabolic encephalopathy - multifactorial but largely due to multiple strokes -Using ativan > fentanyl IV prn    AKI from ATN in setting of hypoxia, cardiogenic shock. - HD m-w-f    Plan Continue HD but careful with pulling too much vol off as still levophed dep     Mixed Cardiogenic Septic shock by hx  Recurrent aspiration pneumonia but no source sepsis and PCT way down 4/5 (p HD which artificially raises it)  -Levophed for MAP 65 and above with cvp 12-18 range suggesting adequate vasc volume so shouldbe able wean off levophed -continue midodrine 10 mg tid (max dose)    Chronic systolic CHF, non-ischemic EF 15% Polymorphic VT. Apical thrombus in left ventricle. Poor prognosis Per heart failure team. Not a candidate for advanced mechanical support. Currently on ASA Lipitor  -continue midodrine 10 mg tid (max dose)  - reduced amiodarone to 200 mg daily 4/6   HIT - actively bleeding so holding  bivalrudin since am 4/5 and then PEG 4/6 and resume when hemostasis achieved post op   Lt MCA CVA likely embolic. Left [place] CVA, ?new noted on 3/17 Hx of cocaine abuse,  depression. Plan Continue supportive care, statin  Anemia of critical illness  / acute trach bleeding 4/5 am improved am 4/6    Lab Results  Component Value Date   HGB 8.9 (L) 06/21/2019   HGB 8.0 (L) 06/20/2019   HGB 9.0 (L) 06/19/2019     Plan Transfuse only for hemoglobin 7 or lower at this point     DM type II poorly controlled with hyperglycemia. CBG (last 3)  Recent Labs  06/20/19 2341 06/21/19 0341 06/21/19 0806  GLUCAP 124* 154* 133*   Plan Continue current Levemir and sliding scale insulin  Deconditioning Will revisit PT/OT as he is more alert today  Best practice:  Diet: tube feeds. PEG tube planned for 4/6  Pain/Anxiety/Delirium protocol (if indicated): prn fentanyl/ ativan  VAP protocol (if indicated): ordered DVT prophylaxis:  PAS hose - holding bivalrudin due to trach bleed/need for PET 4/6 GI prophylaxis: PPI Glucose control: at goal<180 Foley: minimal uop/ no foley needed with residual of 60 cc am 4/5  Mobility: bed rest Code Status: Full Family Communication: mother visits 10 am daily  Disposition: needs ICU     The patient is critically ill with multiple organ systems failure and requires high complexity decision making for assessment and support, frequent evaluation and titration of therapies, application of advanced monitoring technologies and extensive interpretation of multiple databases. Critical Care Time devoted to patient care services described in this note is 35 minutes.     Christinia Gully, MD Pulmonary and [place] [phone] After 6:00 PM or weekends, use Beeper [phone]  After 7:00 pm call Elink  [phone]

## 2019-06-22 DIAGNOSIS — J9601 Acute respiratory failure with hypoxia: Secondary | ICD-10-CM | POA: Diagnosis not present

## 2019-06-22 LAB — GLUCOSE, CAPILLARY
Glucose-Capillary: 101 mg/dL — ABNORMAL HIGH (ref 70–99)
Glucose-Capillary: 112 mg/dL — ABNORMAL HIGH (ref 70–99)
Glucose-Capillary: 120 mg/dL — ABNORMAL HIGH (ref 70–99)
Glucose-Capillary: 142 mg/dL — ABNORMAL HIGH (ref 70–99)
Glucose-Capillary: 143 mg/dL — ABNORMAL HIGH (ref 70–99)
Glucose-Capillary: 96 mg/dL (ref 70–99)

## 2019-06-22 LAB — PROTIME-INR
INR: 1.4 — ABNORMAL HIGH (ref 0.8–1.2)
Prothrombin Time: 17.1 seconds — ABNORMAL HIGH (ref 11.4–15.2)

## 2019-06-22 LAB — RENAL FUNCTION PANEL
Albumin: 2.4 g/dL — ABNORMAL LOW (ref 3.5–5.0)
Albumin: 2.3 g/dL — ABNORMAL LOW (ref 3.5–5.0)
Anion gap: 13 (ref 5–15)
Anion gap: 14 (ref 5–15)
BUN: 55 mg/dL — ABNORMAL HIGH (ref 6–20)
BUN: 28 mg/dL — ABNORMAL HIGH (ref 6–20)
CO2: 22 mmol/L (ref 22–32)
CO2: 24 mmol/L (ref 22–32)
Calcium: 9 mg/dL (ref 8.9–10.3)
Calcium: 8.4 mg/dL — ABNORMAL LOW (ref 8.9–10.3)
Chloride: 100 mmol/L (ref 98–111)
Chloride: 102 mmol/L (ref 98–111)
Creatinine, Ser: 3.59 mg/dL — ABNORMAL HIGH (ref 0.61–1.24)
Creatinine, Ser: 2.05 mg/dL — ABNORMAL HIGH (ref 0.61–1.24)
GFR calc Af Amer: 24 mL/min — ABNORMAL LOW (ref >60)
GFR calc Af Amer: 47 mL/min — ABNORMAL LOW (ref >60)
GFR calc non Af Amer: 21 mL/min — ABNORMAL LOW (ref >60)
GFR calc non Af Amer: 41 mL/min — ABNORMAL LOW (ref >60)
Glucose, Bld: 162 mg/dL — ABNORMAL HIGH (ref 70–99)
Glucose, Bld: 139 mg/dL — ABNORMAL HIGH (ref 70–99)
Phosphorus: 3.5 mg/dL (ref 2.5–4.6)
Phosphorus: 1.9 mg/dL — ABNORMAL LOW (ref 2.5–4.6)
Potassium: 3.9 mmol/L (ref 3.5–5.1)
Potassium: 3.9 mmol/L (ref 3.5–5.1)
Sodium: 135 mmol/L (ref 135–145)
Sodium: 140 mmol/L (ref 135–145)

## 2019-06-22 LAB — CBC
HCT: 28.2 % — ABNORMAL LOW (ref 39.0–52.0)
Hemoglobin: 8.2 g/dL — ABNORMAL LOW (ref 13.0–17.0)
MCH: 30.8 pg (ref 26.0–34.0)
MCHC: 29.1 g/dL — ABNORMAL LOW (ref 30.0–36.0)
MCV: 106 fL — ABNORMAL HIGH (ref 80.0–100.0)
Platelets: 193 10*3/uL (ref 150–400)
RBC: 2.66 MIL/uL — ABNORMAL LOW (ref 4.22–5.81)
RDW: 19.4 % — ABNORMAL HIGH (ref 11.5–15.5)
WBC: 9.5 10*3/uL (ref 4.0–10.5)
nRBC: 0.3 % — ABNORMAL HIGH (ref 0.0–0.2)

## 2019-06-22 LAB — MAGNESIUM: Magnesium: 2.3 mg/dL (ref 1.7–2.4)

## 2019-06-22 LAB — APTT: aPTT: 58 seconds — ABNORMAL HIGH (ref 24–36)

## 2019-06-22 MED ORDER — HEPARIN SODIUM (PORCINE) 1000 UNIT/ML IJ SOLN
INTRAMUSCULAR | Status: AC
  Filled 2019-06-22: qty 4

## 2019-06-22 MED ORDER — WHITE PETROLATUM EX OINT
TOPICAL_OINTMENT | CUTANEOUS | Status: AC
  Filled 2019-06-22: qty 28.35

## 2019-06-22 NOTE — Progress Notes (Signed)
Occupational Therapy Treatment Patient Details Name: Carlos Brewer. MRN: [id number] DOB: [birth year] Today's Date: 06/22/2019    History of present illness Pt is a 34 y/o male smoker who initially presented on 2/5 with slurred speech and Rt sided weakness. Admitted with left MCA CVA and multiple smaller embolic infarcts s/p tPA and thrombectomy in IR.  Additionally found to have left ventricular  thrombus. Hospital course complicated by septic shock r/t HCAP, AKI requiring CRRT, polymorphic VT and cardiogenic shock. Echo EF < 20%. Pt with Intermittent pressor dependence, required tracheostomy. Tolerating trach collar 3/4, back on vent 3/5 with flash pulmonary edema.  Started IHD 3/10.   OT comments  Pt engaging in bed level activity this session, tolerating bed mobility rolling towards L/R for pericare and changing of bed linens during session. Pt following simple commands but often requiring up to max multimodal cues during session to do so. Pt with purposeful movement attempting to wave/"high five" NT hand. BP soft but improving as session progressed. Acute care goals updated this date to reflect pt progress. Will continue per POC at this time.    Follow Up Recommendations  LTACH;Supervision/Assistance - 24 hour    Equipment Recommendations  Other (comment)(TBD)          Precautions / Restrictions Precautions Precautions: Fall Precaution Comments: trach collar, watch BP, NG Restrictions Weight Bearing Restrictions: No       Mobility Bed Mobility Overal bed mobility: Needs Assistance Bed Mobility: Rolling Rolling: Total assist;+2 for physical assistance         General bed mobility comments: rolling to L/R for pericare/changing bed linens                 ADL either performed or assessed with clinical judgement   ADL Overall ADL's : Needs assistance/impaired                             Toileting- Clothing Manipulation and Hygiene: Total  assistance;+2 for physical assistance;+2 for safety/equipment;Bed level Toileting - Clothing Manipulation Details (indicate cue type and reason): pericare at bed level, changing bed linens        General ADL Comments: pt engaging in bed mobility for pericare/linen change during session     Cognition Arousal/Alertness: Awake/alert Behavior During Therapy: Flat affect Overall Cognitive Status: Impaired/Different from baseline Area of Impairment: Attention;Following commands;Safety/judgement;Problem solving;Awareness                   Current Attention Level: Focused   Following Commands: Follows one step commands with increased time;Follows one step commands inconsistently Safety/Judgement: Decreased awareness of safety;Decreased awareness of deficits Awareness: Intellectual Problem Solving: Slow processing;Difficulty sequencing;Requires verbal cues;Requires tactile cues;Decreased initiation          Exercises     Shoulder Instructions       General Comments      Pertinent Vitals/ Pain       Pain Assessment: Faces Faces Pain Scale: No hurt Pain Intervention(s): Monitored during session  Home Living                                          Prior Functioning/Environment              Frequency  Min 2X/week        Progress Toward Goals  OT Goals(current goals can now  be found in the care plan section)  Progress towards OT goals: Progressing toward goals  Acute Rehab OT Goals Patient Stated Goal: to d/c home with mother after rehab OT Goal Formulation: With patient Time For Goal Achievement: 07/06/19 Potential to Achieve Goals: [place] Discharge plan remains appropriate    Co-evaluation                 AM-PAC OT "6 Clicks" Daily Activity     Outcome Measure   Help from another person eating meals?: Total Help from another person taking care of personal grooming?: A Lot Help from another person toileting, which  includes using toliet, bedpan, or urinal?: Total Help from another person bathing (including washing, rinsing, drying)?: Total Help from another person to put on and taking off regular upper body clothing?: Total Help from another person to put on and taking off regular lower body clothing?: Total 6 Click Score: 7    End of Session Equipment Utilized During Treatment: Oxygen  OT Visit Diagnosis: Muscle weakness (generalized) (M62.81);Other abnormalities of gait and mobility (R26.89)   Activity Tolerance Patient tolerated treatment well   Patient Left in bed;with call bell/phone within reach;with restraints reapplied;with family/visitor present   Nurse Communication Mobility status        Time: 8657-8469 OT Time Calculation (min): 24 min  Charges: OT General Charges $OT Visit: 1 Visit OT Treatments $Self Care/Home Management : 8-22 mins  Lou Cal, [place] Pager [phone] Office [place] 06/22/2019, 5:10 PM

## 2019-06-22 NOTE — Progress Notes (Signed)
[facility] NEPHROLOGY PROGRESS NOTE  Assessment/ Plan: Pt is a 34 y.o. yo male with history of CHF, myocarditis, and NICM, stroke, hospital course complicated by septic shock, HCAP and AKI requiring CRRT.  #Acute kidney injury multifactorial etiology including ATN following cardiogenic/septic shock; Discontinue CRRT on 4/4.  Tolerated intermittent hemodialysis on 4/5. Patient remains anuric therefore we will continue MWF schedule for dialysis.  HD today.  Continue to watch for renal recovery.  Overall prognosis is poor given no improvement in encephalopathy and remains vent and dialysis dependent.  Palliative care has seen the patient.  #Acute stroke: Likely embolic with bilateral infarcts and LV thrombus.  Has residual encephalopathy.  #Hypoxic respiratory failure, prolonged ventilator dependent, has tracheostomy.  Pulmonary following.  #Shock: Off of levo.  Blood pressure soft.  #Anemia: Continue ESA.  No Iron because of concern of infection.  Transfuse as needed.  # CKD-MBD; on Renvela.  Phosphorus level acceptable. PTH 70.  Subjective: Seen and examined at bedside.  Off pressors.  Remains anuric.  Patient's mother at bedside. Objective Vital signs in last 24 hours: Vitals:   06/22/19 0725 06/22/19 0800 06/22/19 0900 06/22/19 1000  BP:  112/74 98/70 100/66  Pulse: 94 93 98 94  Resp: 17 (!) 42 19 (!) 39  Temp:  98 F (36.7 C)    TempSrc:  Axillary    SpO2: 97% (!) 85% 100% 92%  Weight:      Height:       Weight change: 2.8 kg  Intake/Output Summary (Last 24 hours) at 06/22/2019 1103 Last data filed at 06/22/2019 1000 Gross per 24 hour  Intake 3273.47 ml  Output --  Net 3273.47 ml       Labs: Basic Metabolic Panel: Recent Labs  Lab 06/21/19 0611 06/21/19 1624 06/22/19 0427  NA 135 134* 135  K 3.9 4.1 3.9  CL 97* 98 100  CO2 23 23 22   GLUCOSE 121* 133* 162*  BUN 34* 43* 55*  CREATININE 2.82* 3.34* 3.59*  CALCIUM 9.2 9.0 9.0  PHOS 2.9 3.3 3.5    Liver Function Tests: Recent Labs  Lab 06/21/19 0611 06/21/19 1624 06/22/19 0427  ALBUMIN 2.6* 2.6* 2.4*   No results for input(s): LIPASE, AMYLASE in the last 168 hours. No results for input(s): AMMONIA in the last 168 hours. CBC: Recent Labs  Lab 06/17/19 0850 06/17/19 0850 06/18/19 1112 06/18/19 1112 06/19/19 1120 06/19/19 1120 06/20/19 0623 06/21/19 0611 06/22/19 0427  WBC 13.0*   < > 8.3   < > 8.1   < > 10.4 10.1 9.5  NEUTROABS 11.1*  --   --   --   --   --   --   --   --   HGB 10.4*   < > 9.8*   < > 9.0*   < > 8.0* 8.9* 8.2*  HCT 33.7*   < > 34.1*   < > 31.0*   < > 27.6* 30.6* 28.2*  MCV 106.0*   < > 105.9*  --  106.9*  --  108.7* 105.2* 106.0*  PLT 332   < > 241   < > 209   < > 224 190 193   < > = values in this interval not displayed.   Cardiac Enzymes: No results for input(s): CKTOTAL, CKMB, CKMBINDEX, TROPONINI in the last 168 hours. CBG: Recent Labs  Lab 06/21/19 1544 06/21/19 1955 06/21/19 2333 06/22/19 0343 06/22/19 0836  GLUCAP 154* 77 126* 143* 120*    Iron  Studies: No results for input(s): IRON, TIBC, TRANSFERRIN, FERRITIN in the last 72 hours. Studies/Results: DG Chest Port 1 View  Result Date: 06/20/2019 CLINICAL DATA:  Respiratory failure, hypoxia EXAM: PORTABLE CHEST 1 VIEW COMPARISON:  06/17/2019 FINDINGS: Interval removal of enteric feeding tube and placement of esophagogastric tube, tip and side port below the diaphragm. Near complete interval resolution of previously noted pneumomediastinum. Tracheostomy. Unchanged cardiomegaly. Diffuse interstitial and heterogeneous airspace opacity, not significantly changed. IMPRESSION: 1. Interval removal of enteric feeding tube and placement of esophagogastric tube, tip and side port below the diaphragm. 2. Near complete interval resolution of previously noted pneumomediastinum. 3. Unchanged cardiomegaly and diffuse interstitial heterogeneous airspace opacity, consistent with edema, infection, and/or  ARDS. No new airspace opacity. Electronically Signed   By: Eddie Candle M.D.   On: 06/20/2019 11:19    Medications: Infusions: . sodium chloride Stopped (06/17/19 1357)  . sodium chloride    . sodium chloride    . sodium chloride    . anticoagulant sodium citrate    . bivalirudin (ANGIOMAX) infusion 0.5 mg/mL (Non-ACS indications) 0.052 mg/kg/hr (06/22/19 1000)  . feeding supplement (VITAL 1.5 CAL) 1,000 mL (06/22/19 6728)  . fentaNYL infusion INTRAVENOUS Stopped (06/18/19 1241)  . norepinephrine (LEVOPHED) Adult infusion Stopped (06/22/19 9791)    Scheduled Medications: . amiodarone  200 mg Per Tube Daily  . ARIPiprazole  10 mg Per Tube Daily  . aspirin  81 mg Per Tube Daily  . atorvastatin  40 mg Per Tube q1800  . chlorhexidine  15 mL Mouth Rinse BID  . chlorhexidine gluconate (MEDLINE KIT)  15 mL Mouth Rinse BID  . Chlorhexidine Gluconate Cloth  6 each Topical Q0600  . darbepoetin (ARANESP) injection - DIALYSIS  150 mcg Intravenous Q Mon-HD  . feeding supplement (PRO-STAT SUGAR FREE 64)  60 mL Per Tube BID  . FLUoxetine  20 mg Per Tube Daily  . free water  100 mL Per Tube Q4H  . insulin aspart  0-20 Units Subcutaneous Q4H  . insulin aspart  7 Units Subcutaneous Q4H  . insulin detemir  40 Units Subcutaneous BID  . mouth rinse  15 mL Mouth Rinse q12n4p  . midodrine  10 mg Per Tube Q8H  . pantoprazole sodium  40 mg Per Tube Q1200  . polyethylene glycol  17 g Per Tube Daily  . sevelamer carbonate  2.4 g Per Tube TID WC  . sodium chloride flush  10-40 mL Intracatheter Q12H    have reviewed scheduled and prn medications.  Physical Exam: General: On trach/vent, not in distress, opens eyes with the name only Heart:RRR, s1s2 nl, no rubs Lungs: Coarse breath sound bilateral Abdomen:soft, Non-tender, non-distended Extremities: Trace LE edema Dialysis Access: Right IJ TDC placed by IR on 3/2.  Alexandra Lipps Tanna Furry 06/22/2019,11:03 AM  LOS: 46 days  Pager: [phone]

## 2019-06-22 NOTE — Progress Notes (Signed)
Nutrition Follow-up  DOCUMENTATION CODES:   Not applicable  INTERVENTION:   Continue:  Vital 1.5 @ 60 ml/hr (1440 ml/day) via NG tube 60 ml Prostat BID  100 ml every 4 hours: 600 ml   Provides: 2560 kcal, 157 grams protein, and 1700 ml free water.    NUTRITION DIAGNOSIS:   Inadequate oral intake related to inability to eat as evidenced by NPO status. Ongoing.   GOAL:   Patient will meet greater than or equal to 90% of their needs Meeting with TF.   MONITOR:   TF tolerance  REASON FOR ASSESSMENT:   Ventilator   ASSESSMENT:   Pt with PMH of CHF, OSA, poorly controlled DM, cocaine abuse admitted with L MCA stroke s/p tPA and IR thrombectomy.   PEG placement delayed due to back order of g-tube catheters. NG tube in place Pt tolerating trach collar and off pressors at this time.  Renal following pt for iHD.  Plan for LTACH placement at time of discharge.   2/6 started on CRRT  2/18 s/p trach placement 3/10 CRRT stopped 3/12 iHD 3/17 febrile; developed septic shock on pressors, lines changed, on abx  3/29 s/p bronch, chest xray shows resolving LLL PNA due to aspiration with poor control of secretions  3/31 pt had rapid response on progressive unit, pt transferred to ICU s/p trach change and on currently on vent support    Medications reviewed and include:  7 units novolog every 4 hours, 40 units levemir BID, miralax, renvela TID    Labs reviewed:  CBGs:143-120-96   UOP: x 1 unmeasured  UF: 1,000 ml - 4/7  Diet Order:   Diet Order            Diet NPO time specified Except for: Sips with Meds  Diet effective midnight              EDUCATION NEEDS:   No education needs have been identified at this time  Skin:  Skin Assessment: Skin Integrity Issues: Skin Integrity Issues:: DTI DTI: L heel Stage II: NA Other: skin tear to coccyx and penis  Last BM:  4/7 medium  Height:   Ht Readings from Last 1 Encounters:  04/27/2019 6\' 1"  (1.854 m)     Weight:   Wt Readings from Last 1 Encounters:  06/22/19 110.9 kg    Ideal Body Weight:  83.6 kg  BMI:  Body mass index is 32.26 kg/m.  Estimated Nutritional Needs:   Kcal:  2500-2700  Protein:  150-170 grams  Fluid:  2 L/day  Lockie Pares., RD, LDN, CNSC See AMiON for contact information

## 2019-06-22 NOTE — Progress Notes (Signed)
ANTICOAGULATION CONSULT NOTE  Pharmacy Consult:  Bivalirudin  Indication: stroke 2/5, LV thrombus 2/8, HIT 2/15  Allergies  Allergen Reactions  . Heparin Other (See Comments)    Heparin induced thrombocytopenia. 2/15 HIT OD 1.692. 2/16 SRA positive-90.     Patient Measurements: Height: 6\' 1"  (185.4 cm) Weight: 106.9 kg (235 lb 10.8 oz) IBW/kg (Calculated) : 79.9   Vital Signs: Temp: 98.2 F (36.8 C) (04/07 0400) Temp Source: Axillary (04/07 0400) BP: 126/85 (04/07 0400) Pulse Rate: 98 (04/07 0400)  Labs: Recent Labs    06/20/19 2637 06/20/19 8588 06/20/19 5027 06/20/19 0658 06/20/19 1506 06/21/19 0611 06/21/19 1624 06/21/19 1624 06/21/19 1846 06/21/19 2309 06/22/19 0427  HGB 8.0*   < >  --   --   --  8.9*  --   --   --   --  8.2*  HCT 27.6*  --   --   --   --  30.6*  --   --   --   --  28.2*  PLT 224  --   --   --   --  190  --   --   --   --  193  APTT  --   --  65*   < >  --   --  53*   < > 34 63* 58*  LABPROT  --   --  18.6*  --   --  14.7  --   --   --   --  17.1*  INR  --   --  1.6*  --   --  1.2  --   --   --   --  1.4*  CREATININE 3.04*   < >  --   --  1.71* 2.82* 3.34*  --   --   --   --    < > = values in this interval not displayed.    Estimated Creatinine Clearance: 39.6 mL/min (A) (by C-G formula based on SCr of 3.34 mg/dL (H)).  Assessment: 34 yr old male with history of ESRD on HD presented with left MCA infarct - received TPA and revascularization with IR on 2/5. On 2/8 found to have small LV apical thrombus on ECHO. Pharmacy consulted to dose IV heparin, which was switched to bivalirudin 2/15 for HIT. 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 SRA very positive at 28. Heparin allergy has appropriately been added to chart and updated with SRA results. Transitioned off bivalrudin on 3/14. Restarted bivalirudin 3/26 and briefly held on 3/31 for chest tube placement.   Bivalirudin was held on 4/5 due to significant  bleeding from trach and patient was intended to receive PEG on 4/6. PEG placement has been delayed at this time.Per discussion with Dr. Vaughan Browner  bivalirudin was resumed with targeting the lower end of the therapeutic range due to trach bleeding.   APTT remains therapeutic at 58 on bivalirudin 0.052 mg/kg/hr. Per RN trach bleeding has improved with minimal blood noted in secretions. Hgb 8.2. Plt 193.    Goal of Therapy:  aPTT 50-65 seconds per discussion with Dr. Vaughan Browner Monitor platelets by anticoagulation protocol: Yes   Plan:  Continue bivalrudin infusion at 0.052 mg/kg/hr (using current weight of 106.1kg) Check aPTT next daily due to two therapeutic aPTTs Monitor bleeding from trach site  Monitor aPTT, CBC, and S/S of bleeding daily  Follow up plans for PEG placement   Cristela Felt, PharmD PGY1 Pharmacy Resident [employer]:  [phone] 06/22/2019 5:18 AM

## 2019-06-22 NOTE — Progress Notes (Signed)
IR following for percutaneous gastrostomy tube placement.   Gastrostomy tubes currently out of stock.  Will plan for placement once tubes are available.   Brynda Greathouse, MS RD PA-C

## 2019-06-22 NOTE — Progress Notes (Signed)
   06/22/19 1600  Clinical Encounter Type  Visited With Patient and family together  Visit Type Follow-up  Consult/Referral To Chaplain  Spiritual Encounters  Spiritual Needs Prayer   Chaplain offered ministry of presence and prayer with Reshad and his mother. Chaplains remain available for support as needs arise.   Chaplain Resident, Evelene Croon, M Div [phone] on-call pager

## 2019-06-22 NOTE — Progress Notes (Addendum)
NAME:  Revel Stellmach., MRN:  [id number], DOB:  Apr 28, 1985, LOS: 74 ADMISSION DATE:  05/04/2019, CONSULTATION DATE:  04/21/2019 REFERRING MD:  Dr. Leonel Ramsay, CHIEF COMPLAINT:  Slurred speech  Brief History   34 yo male smoker found to have slurred speech and Rt sided weakness.  Admitted with left MCA CVA and multiple smaller embolic infarcts>>>  tPA and thrombectomy in IR.  Additionally found to have left ventricular  thrombus.    Course complicated by septic shock r/t HCAP, AKI requiring CRRT, polymorphic VT and cardiogenic shock.  Intermittent pressor dependence, required tracheostomy Intermittent hemodialysis [place] CVA Heart failure with reduced ejection fraction < 15% on last echocardiogram  Past Medical History  Systolic CHF with non ischemic CM, Cocaine abuse, OSA, DM Hx of CHF (EF 15%)  Hx myocarditis Smoker 1/2 ppd   Significant Hospital Events   2/05 Admit, tPA, IR thrombectomy 2/6 Shock requiring increasing levophed and phenylephrine.   2/6: switched to levophed, epi, started antibiotics, started on CRRT.  2/9  developed wide-complex tachycardia and hypotension, started on amiodarone drip, increase Levophed drip 2/15 drop in platelets, heparin stopped, bival started 2/17 Hypotensive and back on Levophed 2/18 trach, lines changed 2/19 started milrinone , fever 103 2/20 Episode of wide-complex tachycardia yesterday, changed from Levophed to vasopressin 2/22 stopping vanc. Still on inotrope support. Some AF w/ RVR. Had to be placed back on pressors. ivabradine added 2/23 still on pressors/ CRRT continued 2/24 tmax 98.4, Remains on CRRT, even UF, remains anuric, Levophed at 14 mcg/min, Milrinone at 0.125 mcg/kg/min, Coox 63.4 Doing well this morning on ATC, no events overnight.  Midodrine added   2/25 more interactive, remains on ATC, tmax 99.5/ WBC 21.4, ~900 ml liquid stool/ 24 hours, neg Cdiff, Levophed up to 20 mcg/min, CVP 2, ,milrinone remains at 0.125  mcg/kg/min, coox 92.7, Off CRRT since last night ~2000 s/p clotted x 3 off citrate, restarted  3/1 Amio drip restarted for WCT/atrial flutter, milrinone turned off 3/2 CRRT stopped, permacath placed 3/5 crrt resumed 3/6 started on pressors for crrt tolerance 3/8 No longer on CRRT or pressor support  3/12 Tolerated PS for >2 hours  3/17  febrile 106 overnight, shock on pressors, new lines placed, broad-spectrum antibiotics 3/22: Awake, appears comfortable on pressure support ventilation. weaning Precedex, he is back on low-dose norepinephrine 3/23 pm back on vent at hs for resp distress / desats ? chf on cxr  3/24 t collar 24/7 3/29 tolerating ATM without difficulty 3/31 tr back to ICU -trach changed to 6 cuffless, bronched, new fever, started on antibiotics, pneumomediastinum small biapical pneumothoraces-stable 4/1 CRRT transient 4/5 started back on HD tiw, pneumothorax, pneumomediastinum resolved. Active tracheal bleeding> Bival held 4/6 reduced amiodarone to 200 mg daily, bival resumed 4/7 Off levo, tolerating trach collar.   Consults:  Neuro IR Cardiology  Nephrology Heart Failure  EP   Procedures:  2/5-2/6:  TPA given at 428 am (total of 90 Mg) 520 am went to IR  S/P Lt common carotid arteriogram followed by complete revascularization of occluded LT MCA sup division mid M2 seg with x 1 pass with 44mx 40 mm solitaire X ret river device and penumbra aspiration with TICI 3 revascularization  ETT 2/05 >> 2/18 LIJ CVL 2/5 >>2/18  RT [place] CVL 2/18 >>3/2 RIJ HD cath 2/6 >>2/18 6 shiley cuffed trach 2/18  >>3/4  RT fem HD 2/19 >>3/2 RIJ permacath 3/2>> 3/4 shiley 4 uncuffed.... dislodged placed back 6cuffed shiley that evening 3/5: change  to #6 cuffed distal XLT  3/17 right femoral CVL >> 3/23 3/17 right femoral arterial line >>3/23 3/31 bronchoscopy : airway clear, conclusion = end of trach up against the wall obstructing airflow   3/31 trach change to cuffed #6 XLT  4/5  back on t collar 24/7  And still levophed dep  4/6  PEG planned  Significant Diagnostic Tests:  CT angio head/neck 2/05 >> occlusion of Lt MCA bifurcation Echo 2/05 >> EF less than 20%, cannot rule out apical thrombus MRI brain 2/11 > extensive acute infarction of multiple areas without large or medium vessel occlusion Echocardiogram 2/8 Left ventricular ejection fraction, by estimation, is <20%. The left  ventricle has severely decreased function. The left ventricle demonstrates  global hypokinesis. The left ventricular internal cavity size was severely  dilated. Possible small 0.8 x 0.6 cm apical thrombus.   3/16 duplex upper and lower extremities >>neg  3/17 head CT >.  New area of hypoattenuation in the left pons , left frontal encephalomalacia related to old infarct 3/31CT chestreveals extensive pneumomediastinum and subcutaneous emphysema in the lower neck.  There is less than 5% bilateral pneumothoraces.  Extensive airspace disease right greater than left consistent with ARDS or infection  3/31 head CT >> neg for new pathology Micro Data:  SARS CoV2 PCR 2/05 >> negative Influenza PCR 2/05 >> negative ... Cdiff 2/25 >> neg Trach asp 3/11>> Few candida albicans BC 3/11>> 1 of 4 with Coag neg staph  BC 3/12 x 2 > neg  BC 3/17 x 2 > neg   trach 3/24 >  no  wbc, no organisms final - nl flora  Bronchoscopy 3/31  moderate GPC, normal respiratory flora Blood cultures 3/31 Neg   Antimicrobials:  mero 2/6 -2/12 Zosyn 2/6 , 2/19 >> 2/23 vanc 2/6 >> 2/9 , 2/19 >>2/22 Ceftriaxone 3/12> 3/16 3/17 vanc >> 3/19 3/17 Anidulafungin >> 3/19 3/17 cefepime >>  3/22  3/31 vancomycin>>4/3 3/31 cefepime>>4/3 Unasyn 4/3>>4/4  Interim history/subjective:   Off levo today. No acute events.  No acute events overnight. On trach collar since 2 days  Objective   Blood pressure 110/89, pulse 94, temperature 98 F (36.7 C), temperature source Axillary, resp. rate 17, height 6' 1"  (1.854 m),  weight 106.9 kg, SpO2 97 %. CVP:  [15 mmHg] 15 mmHg  FiO2 (%):  [28 %] 28 %   Intake/Output Summary (Last 24 hours) at 06/22/2019 0918 Last data filed at 06/22/2019 0700 Gross per 24 hour  Intake 2216.68 ml  Output --  Net 2216.68 ml   Filed Weights   06/20/19 1115 06/21/19 0401 06/22/19 0438  Weight: 104.1 kg 106.1 kg 106.9 kg   CVP:  [15 mmHg] 15 mmHg    Physical Exam. Gen:      No acute distress HEENT:  EOMI, sclera anicteric, Trach Neck:     No masses; no thyromegaly Lungs:    Clear to auscultation bilaterally; normal respiratory effort CV:         Regular rate and rhythm; no murmurs Abd:      + bowel sounds; soft, non-tender; no palpable masses, no distension Ext:    No edema; adequate peripheral perfusion Skin:      Warm and dry; no rash Neuro: alert and oriented x 3 Psych: normal mood and affect  Labs significant for BUN/creatinine 55/3.59 Hemoglobin 8.2, platelets 193 No new imaging   Resolved Hospital Problem list   HCAP 1/69, Acute metabolic encephalopathy 2nd to hypoxia and renal failure, Cardiogenic  shock HIT   Assessment & Plan:   Acute on chronic respiratory failure Tracheostomy with intermittent bleeding Pneumomediastinum, pneumothoraces> resolved by pCXR 4/5  -note component of Cheyne-Stokes breathing Back on trach collar Minimal bleeding even after initiation of bival Follow CXR  Acute metabolic encephalopathy Multifactorial but largely due to multiple strokes Using ativan > fentanyl IV prn   AKI from ATN in setting of hypoxia, cardiogenic shock. HD m-w-f    Plan Continue dialysis per nephrology.   Mixed Cardiogenic Septic shock, present on admission Sepsis present on admission Recurrent aspiration pneumonia Weaning off Levophed, Observe off antibiotics Continue midodrine  Chronic systolic CHF, non-ischemic EF 15% Polymorphic VT. Apical thrombus in left ventricle. Poor prognosis Per heart failure team. Not a candidate for advanced  mechanical support. Currently on ASA Lipitor  -continue midodrine 10 mg tid (max dose)  - reduced amiodarone to 200 mg daily 4/6   HIT Resume bivalirudin   Lt MCA CVA likely embolic. Left [place] CVA, ?new noted on 3/17 Hx of cocaine abuse, depression. Plan Continue supportive care, statin  Anemia of critical illness  / acute trach bleeding 4/5 am improved am 4/6  Transfuse only for hemoglobin 7 or lower at this point    DM type II poorly controlled with hyperglycemia. CBG (last 3)  Continue current Levemir and sliding scale insulin  Deconditioning Resume PT/OT  Goals of care Palliative care on board. Continue discussions with the family. Remains full code for now. Will likely need LTAC once critical issues are addressed.  Nutrition Resume tube feeds once cor track is placed.   Awaiting PEG tube placement by IR.  Procedure has been delayed due to nonavailability of catheters  Best practice:  Diet: Tube feeds when access is obtained   Pain/Anxiety/Delirium protocol (if indicated): prn fentanyl/ ativan  VAP protocol (if indicated): ordered DVT prophylaxis:  PAS hose, Bival GI prophylaxis: PPI Glucose control: at goal<180 Foley: minimal uop/ no foley needed with residual of 60 cc am 4/5  Mobility: bed rest Code Status: Full Family Communication: mother visits 10 am daily  Disposition: needs ICU. Can transfer out 4/8 if the remains stable  The patient is critically ill with multiple organ system failure and requires high complexity decision making for assessment and support, frequent evaluation and titration of therapies, advanced monitoring, review of radiographic studies and interpretation of complex data.   Critical Care Time devoted to patient care services, exclusive of separately billable procedures, described in this note is 35 minutes.   Marshell Garfinkel MD  Pulmonary and Critical Care Please see [email or web address] for pager details.  06/22/2019, 9:18 AM

## 2019-06-23 ENCOUNTER — Inpatient Hospital Stay (HOSPITAL_COMMUNITY): Payer: No Typology Code available for payment source

## 2019-06-23 ENCOUNTER — Encounter (HOSPITAL_COMMUNITY): Payer: Self-pay | Admitting: Neurology

## 2019-06-23 DIAGNOSIS — J9601 Acute respiratory failure with hypoxia: Secondary | ICD-10-CM | POA: Diagnosis not present

## 2019-06-23 DIAGNOSIS — Z930 Tracheostomy status: Secondary | ICD-10-CM | POA: Diagnosis not present

## 2019-06-23 DIAGNOSIS — N179 Acute kidney failure, unspecified: Secondary | ICD-10-CM | POA: Diagnosis not present

## 2019-06-23 HISTORY — PX: IR GASTROSTOMY TUBE MOD SED: IMG625

## 2019-06-23 LAB — RENAL FUNCTION PANEL
Albumin: 2.4 g/dL — ABNORMAL LOW (ref 3.5–5.0)
Albumin: 2.4 g/dL — ABNORMAL LOW (ref 3.5–5.0)
Anion gap: 14 (ref 5–15)
Anion gap: 15 (ref 5–15)
BUN: 42 mg/dL — ABNORMAL HIGH (ref 6–20)
BUN: 45 mg/dL — ABNORMAL HIGH (ref 6–20)
CO2: 27 mmol/L (ref 22–32)
CO2: 24 mmol/L (ref 22–32)
Calcium: 9 mg/dL (ref 8.9–10.3)
Calcium: 9.2 mg/dL (ref 8.9–10.3)
Chloride: 99 mmol/L (ref 98–111)
Chloride: 99 mmol/L (ref 98–111)
Creatinine, Ser: 2.55 mg/dL — ABNORMAL HIGH (ref 0.61–1.24)
Creatinine, Ser: 2.77 mg/dL — ABNORMAL HIGH (ref 0.61–1.24)
GFR calc Af Amer: 36 mL/min — ABNORMAL LOW (ref >60)
GFR calc Af Amer: 33 mL/min — ABNORMAL LOW (ref >60)
GFR calc non Af Amer: 31 mL/min — ABNORMAL LOW (ref >60)
GFR calc non Af Amer: 28 mL/min — ABNORMAL LOW (ref >60)
Glucose, Bld: 71 mg/dL (ref 70–99)
Glucose, Bld: 70 mg/dL (ref 70–99)
Phosphorus: 3 mg/dL (ref 2.5–4.6)
Phosphorus: 4.1 mg/dL (ref 2.5–4.6)
Potassium: 3.9 mmol/L (ref 3.5–5.1)
Potassium: 4 mmol/L (ref 3.5–5.1)
Sodium: 140 mmol/L (ref 135–145)
Sodium: 138 mmol/L (ref 135–145)

## 2019-06-23 LAB — CBC
HCT: 29.9 % — ABNORMAL LOW (ref 39.0–52.0)
Hemoglobin: 8.9 g/dL — ABNORMAL LOW (ref 13.0–17.0)
MCH: 31.2 pg (ref 26.0–34.0)
MCHC: 29.8 g/dL — ABNORMAL LOW (ref 30.0–36.0)
MCV: 104.9 fL — ABNORMAL HIGH (ref 80.0–100.0)
Platelets: 182 K/uL (ref 150–400)
RBC: 2.85 MIL/uL — ABNORMAL LOW (ref 4.22–5.81)
RDW: 19.3 % — ABNORMAL HIGH (ref 11.5–15.5)
WBC: 7.7 K/uL (ref 4.0–10.5)
nRBC: 0 % (ref 0.0–0.2)

## 2019-06-23 LAB — GLUCOSE, CAPILLARY
Glucose-Capillary: 114 mg/dL — ABNORMAL HIGH (ref 70–99)
Glucose-Capillary: 69 mg/dL — ABNORMAL LOW (ref 70–99)
Glucose-Capillary: 67 mg/dL — ABNORMAL LOW (ref 70–99)
Glucose-Capillary: 142 mg/dL — ABNORMAL HIGH (ref 70–99)
Glucose-Capillary: 82 mg/dL (ref 70–99)
Glucose-Capillary: 76 mg/dL (ref 70–99)
Glucose-Capillary: 71 mg/dL (ref 70–99)
Glucose-Capillary: 76 mg/dL (ref 70–99)

## 2019-06-23 LAB — PROTIME-INR
INR: 1.4 — ABNORMAL HIGH (ref 0.8–1.2)
Prothrombin Time: 17.2 seconds — ABNORMAL HIGH (ref 11.4–15.2)

## 2019-06-23 LAB — APTT
aPTT: 48 seconds — ABNORMAL HIGH (ref 24–36)
aPTT: 52 seconds — ABNORMAL HIGH (ref 24–36)

## 2019-06-23 LAB — MAGNESIUM: Magnesium: 2 mg/dL (ref 1.7–2.4)

## 2019-06-23 MED ORDER — DEXTROSE 50 % IV SOLN
12.5000 g | INTRAVENOUS | Status: AC
  Administered 2019-06-23: 04:00:00 12.5 g via INTRAVENOUS
  Filled 2019-06-23: qty 50

## 2019-06-23 MED ORDER — DEXTROSE 50 % IV SOLN
12.5000 g | INTRAVENOUS | Status: AC
  Administered 2019-06-23: 12.5 g via INTRAVENOUS

## 2019-06-23 MED ORDER — MIDAZOLAM HCL 2 MG/2ML IJ SOLN
INTRAMUSCULAR | Status: AC | PRN
  Administered 2019-06-23 (×2): 0.5 mg via INTRAVENOUS

## 2019-06-23 MED ORDER — CHLORHEXIDINE GLUCONATE CLOTH 2 % EX PADS
6.0000 | MEDICATED_PAD | Freq: Every day | CUTANEOUS | Status: DC
  Administered 2019-06-23 – 2019-06-25 (×2): 6 via TOPICAL

## 2019-06-23 MED ORDER — IOHEXOL 300 MG/ML  SOLN
50.0000 mL | Freq: Once | INTRAMUSCULAR | Status: AC | PRN
  Administered 2019-06-23: 16:00:00 15 mL

## 2019-06-23 MED ORDER — BACITRACIN-NEOMYCIN-POLYMYXIN 400-5-5000 EX OINT
1.0000 "application " | TOPICAL_OINTMENT | Freq: Every day | CUTANEOUS | Status: DC
  Administered 2019-06-23 – 2019-06-29 (×6): 1 via TOPICAL
  Filled 2019-06-23 (×6): qty 1

## 2019-06-23 MED ORDER — FENTANYL CITRATE (PF) 100 MCG/2ML IJ SOLN
INTRAMUSCULAR | Status: AC | PRN
  Administered 2019-06-23: 25 ug via INTRAVENOUS

## 2019-06-23 MED ORDER — LIDOCAINE HCL 1 % IJ SOLN
INTRAMUSCULAR | Status: AC
  Filled 2019-06-23: qty 20

## 2019-06-23 MED ORDER — DEXTROSE 10 % IV SOLN: INTRAVENOUS | Status: DC

## 2019-06-23 MED ORDER — LIDOCAINE HCL (PF) 1 % IJ SOLN
INTRAMUSCULAR | Status: AC | PRN
  Administered 2019-06-23: 5 mL

## 2019-06-23 MED ORDER — CEFAZOLIN SODIUM-DEXTROSE 2-4 GM/100ML-% IV SOLN
INTRAVENOUS | Status: AC | PRN
  Administered 2019-06-23: 2 g via INTRAVENOUS

## 2019-06-23 MED ORDER — DEXTROSE 50 % IV SOLN
INTRAVENOUS | Status: AC
  Filled 2019-06-23: qty 50

## 2019-06-23 MED ORDER — MIDAZOLAM HCL 2 MG/2ML IJ SOLN
INTRAMUSCULAR | Status: AC
  Filled 2019-06-23: qty 2

## 2019-06-23 MED ORDER — SODIUM CHLORIDE 0.9 % IV SOLN
0.0570 mg/kg/h | INTRAVENOUS | Status: AC
  Administered 2019-06-23: 0.052 mg/kg/h via INTRAVENOUS
  Administered 2019-06-25: 0.057 mg/kg/h via INTRAVENOUS
  Filled 2019-06-23 (×2): qty 250

## 2019-06-23 MED ORDER — CEFAZOLIN SODIUM-DEXTROSE 2-4 GM/100ML-% IV SOLN
INTRAVENOUS | Status: AC
  Filled 2019-06-23: qty 100

## 2019-06-23 MED ORDER — FENTANYL CITRATE (PF) 100 MCG/2ML IJ SOLN
INTRAMUSCULAR | Status: AC
  Filled 2019-06-23: qty 2

## 2019-06-23 NOTE — Progress Notes (Signed)
[facility] NEPHROLOGY PROGRESS NOTE  Assessment/ Plan: Pt is a 34 y.o. yo male with history of CHF, myocarditis, and NICM, stroke, hospital course complicated by septic shock, HCAP and AKI requiring CRRT.  #Acute kidney injury multifactorial etiology including ATN following cardiogenic/septic shock; Discontinued CRRT on 4/4 and has been tolerating intermittent hemodialysis.  Last HD on 4/7 with 1 L UF, on midodrine for hypotension.  Plan for next HD tomorrow.   Patient remains anuric therefore we will continue MWF schedule for dialysis. Continue to watch for renal recovery.  Overall prognosis is poor given no improvement in encephalopathy and remains vent and dialysis dependent.  Palliative care has seen the patient.  #Acute stroke: Likely embolic with bilateral infarcts and LV thrombus.  Has residual encephalopathy.  Noted plan for PEG tube placement today.  #Hypoxic respiratory failure, prolonged ventilator dependent, has tracheostomy.  Pulmonary following.  #Shock: Off of levo.  Blood pressure soft.  On midodrine 10 mg 3 times daily.  #Anemia: Continue ESA.  No Iron because of concern of infection.  Transfuse as needed.  # CKD-MBD; on Renvela.  Phosphorus level acceptable. PTH 70.  Subjective: Seen and examined at bedside.  Plan for gastric feeding tube placement today.  Blood pressures soft.  No new event.  Objective Vital signs in last 24 hours: Vitals:   06/23/19 0600 06/23/19 0700 06/23/19 0800 06/23/19 0825  BP: 106/70 106/71  95/75  Pulse: (!) 54 99 93 93  Resp: (!) 31 (!) 26 (!) 42 19  Temp:   97.6 F (36.4 C)   TempSrc:   Oral   SpO2: 100% 100% 93% 100%  Weight:      Height:       Weight change: 4.9 kg  Intake/Output Summary (Last 24 hours) at 06/23/2019 1032 Last data filed at 06/23/2019 0925 Gross per 24 hour  Intake 906.84 ml  Output 1000 ml  Net -93.16 ml       Labs: Basic Metabolic Panel: Recent Labs  Lab 06/22/19 0427 06/22/19 1712  06/23/19 0828  NA 135 140 140  K 3.9 3.9 3.9  CL 100 102 99  CO2 _0 GLUCOSE 162* 139* 71  BUN 55* 28* 42*  CREATININE 3.59* 2.05* 2.55*  CALCIUM 9.0 8.4* 9.0  PHOS 3.5 1.9* 3.0   Liver Function Tests: Recent Labs  Lab 06/22/19 0427 06/22/19 1712 06/23/19 0828  ALBUMIN 2.4* 2.3* 2.4*   No results for input(s): LIPASE, AMYLASE in the last 168 hours. No results for input(s): AMMONIA in the last 168 hours. CBC: Recent Labs  Lab 06/17/19 0850 06/18/19 1112 06/19/19 1120 06/19/19 1120 06/20/19 0623 06/20/19 0623 06/21/19 0611 06/22/19 0427 06/23/19 0828  WBC 13.0*   < > 8.1   < > 10.4   < > 10.1 9.5 7.7  NEUTROABS 11.1*  --   --   --   --   --   --   --   --   HGB 10.4*   < > 9.0*   < > 8.0*   < > 8.9* 8.2* 8.9*  HCT 33.7*   < > 31.0*   < > 27.6*   < > 30.6* 28.2* 29.9*  MCV 106.0*   < > 106.9*  --  108.7*  --  105.2* 106.0* 104.9*  PLT 332   < > 209   < > 224   < > 190 193 182   < > = values in this interval not displayed.  Cardiac Enzymes: No results for input(s): CKTOTAL, CKMB, CKMBINDEX, TROPONINI in the last 168 hours. CBG: Recent Labs  Lab 06/22/19 2347 06/23/19 0335 06/23/19 0413 06/23/19 0836 06/23/19 0941  GLUCAP 142* 69* 114* 67* 142*    Iron Studies: No results for input(s): IRON, TIBC, TRANSFERRIN, FERRITIN in the last 72 hours. Studies/Results: DG Chest Port 1 View  Result Date: 06/23/2019 CLINICAL DATA:  Acute respiratory failure. EXAM: PORTABLE CHEST 1 VIEW COMPARISON:  06/20/2019. FINDINGS: Interim removal of tracheostomy tube and placement of endotracheal tube. Endotracheal tube tip 2 cm above the lower most portion of the carina. NG tube, right IJ line in stable position. Severe cardiomegaly again noted. Diffuse bilateral pulmonary infiltrates/edema. No pleural effusion or pneumothorax. IMPRESSION: 1. Interim removal of tracheostomy tube and placement of an endotracheal tube. Endotracheal tube tip 2 cm above the lower most portion of the  carina. NG tube and right IJ line stable position. 2.  Severe cardiomegaly.  Again noted. 3.  Diffuse bilateral pulmonary infiltrates/edema. Electronically Signed   By: Marcello Moores  Register   On: 06/23/2019 08:09    Medications: Infusions: . sodium chloride Stopped (06/17/19 1357)  . sodium chloride    . sodium chloride    . sodium chloride    . anticoagulant sodium citrate    . dextrose 30 mL/hr at 06/23/19 0919  . feeding supplement (VITAL 1.5 CAL) 1,000 mL (06/22/19 1736)    Scheduled Medications: . amiodarone  200 mg Per Tube Daily  . ARIPiprazole  10 mg Per Tube Daily  . aspirin  81 mg Per Tube Daily  . atorvastatin  40 mg Per Tube q1800  . chlorhexidine  15 mL Mouth Rinse BID  . chlorhexidine gluconate (MEDLINE KIT)  15 mL Mouth Rinse BID  . Chlorhexidine Gluconate Cloth  6 each Topical Q0600  . darbepoetin (ARANESP) injection - DIALYSIS  150 mcg Intravenous Q Mon-HD  . feeding supplement (PRO-STAT SUGAR FREE 64)  60 mL Per Tube BID  . FLUoxetine  20 mg Per Tube Daily  . free water  100 mL Per Tube Q4H  . insulin aspart  0-20 Units Subcutaneous Q4H  . insulin aspart  7 Units Subcutaneous Q4H  . insulin detemir  40 Units Subcutaneous BID  . mouth rinse  15 mL Mouth Rinse q12n4p  . midodrine  10 mg Per Tube Q8H  . pantoprazole sodium  40 mg Per Tube Q1200  . polyethylene glycol  17 g Per Tube Daily  . sevelamer carbonate  2.4 g Per Tube TID WC  . sodium chloride flush  10-40 mL Intracatheter Q12H    have reviewed scheduled and prn medications.  Physical Exam: General: On trach/vent, not in distress, lethargic today Heart:RRR, s1s2 nl, no rubs Lungs: Coarse breath sound bilateral Abdomen:soft, Non-tender, non-distended Extremities: Trace LE edema Dialysis Access: Right IJ TDC placed by IR on 3/2.  Louvenia Golomb Prasad Kolbey Teichert 06/23/2019,10:32 AM  LOS: 62 days  Pager: [phone]

## 2019-06-23 NOTE — Progress Notes (Signed)
  Speech Language Pathology Treatment: Cognitive-Linquistic;Passy Muir Speaking valve  Patient Details Name: Carlos Brewer. MRN: [id number] DOB: September 06, 1985 Today's Date: 06/23/2019 Time: 1000-1018 SLP Time Calculation (min) (ACUTE ONLY): 18 min  Assessment / Plan / Recommendation Clinical Impression  Pt initially very drowsy requiring Max cues to maintain adequate alert state. Cuff was deflated upon arrival. PMV was donned for ~15 mins with vitals remaining stable, and no back pressure noted with removal. Pt's mother arrived mid-session and was able to arouse pt. He maintained appropriate eye contact throughout the session. He phonated while yawning, but no other verbalizations were observed this session, despite Max cues. At the end of the session, the pt waved "bye" with Max cues. Pt is planning to receive PEG today, so no PO trials were attempted this session. Recommend pt wear PMV intermittently with staff with full supervision to initiate verbalizations. Will continue to follow.   HPI HPI: 34 yo male smoker found to have slurred speech and Rt sided weakness.  CT head showed M2 occlusion.  Ultimately treated with thrombolytic and thrombectomy by IR.  Required intubation for airway protection. Course complicated by septic shock, AKI requiring CRRT and polymorphic VT. Intubated 2/5-2/18.      SLP Plan  Continue with current plan of care       Recommendations         Patient may use Passy-Muir Speech Valve: Intermittently with supervision PMSV Supervision: Full MD: Please consider changing trach tube to : Smaller size;Cuffless         Oral Care Recommendations: Oral care QID Follow up Recommendations: LTACH;Skilled Nursing facility SLP Visit Diagnosis: Aphonia (R49.1);Aphasia (R47.01) Plan: Continue with current plan of care       [place], Student SLP Office: [phone]  06/23/2019, 10:27 AM

## 2019-06-23 NOTE — Progress Notes (Signed)
NAME:  Carlos Brewer., MRN:  [id number], DOB:  1985/06/23, LOS: 96 ADMISSION DATE:  05/07/2019, CONSULTATION DATE:  04/30/2019 REFERRING MD:  Dr. Leonel Ramsay, CHIEF COMPLAINT:  Slurred speech  Brief History   34 yo male smoker found to have slurred speech and Rt sided weakness.  Admitted with left MCA CVA and multiple smaller embolic infarcts>>>  tPA and thrombectomy in IR.  Additionally found to have left ventricular  thrombus.    Course complicated by septic shock r/t HCAP, AKI requiring CRRT, polymorphic VT and cardiogenic shock.  Intermittent pressor dependence, required tracheostomy Intermittent hemodialysis [place] CVA Heart failure with reduced ejection fraction < 15% on last echocardiogram  Past Medical History  Systolic CHF with non ischemic CM, Cocaine abuse, OSA, DM Hx of CHF (EF 15%)  Hx myocarditis Smoker 1/2 ppd   Significant Hospital Events   2/05 Admit, tPA, IR thrombectomy 2/6 Shock requiring increasing levophed and phenylephrine.   2/6: switched to levophed, epi, started antibiotics, started on CRRT.  2/9  developed wide-complex tachycardia and hypotension, started on amiodarone drip, increase Levophed drip 2/15 drop in platelets, heparin stopped, bival started 2/17 Hypotensive and back on Levophed 2/18 trach, lines changed 2/19 started milrinone , fever 103 2/20 Episode of wide-complex tachycardia yesterday, changed from Levophed to vasopressin 2/22 stopping vanc. Still on inotrope support. Some AF w/ RVR. Had to be placed back on pressors. ivabradine added 2/23 still on pressors/ CRRT continued 2/24 tmax 98.4, Remains on CRRT, even UF, remains anuric, Levophed at 14 mcg/min, Milrinone at 0.125 mcg/kg/min, Coox 63.4 Doing well this morning on ATC, no events overnight.  Midodrine added   2/25 more interactive, remains on ATC, tmax 99.5/ WBC 21.4, ~900 ml liquid stool/ 24 hours, neg Cdiff, Levophed up to 20 mcg/min, CVP 2, ,milrinone remains at 0.125  mcg/kg/min, coox 92.7, Off CRRT since last night ~2000 s/p clotted x 3 off citrate, restarted  3/1 Amio drip restarted for WCT/atrial flutter, milrinone turned off 3/2 CRRT stopped, permacath placed 3/5 crrt resumed 3/6 started on pressors for crrt tolerance 3/8 No longer on CRRT or pressor support  3/12 Tolerated PS for >2 hours  3/17  febrile 106 overnight, shock on pressors, new lines placed, broad-spectrum antibiotics 3/22: Awake, appears comfortable on pressure support ventilation. weaning Precedex, he is back on low-dose norepinephrine 3/23 pm back on vent at hs for resp distress / desats ? chf on cxr  3/24 t collar 24/7 3/29 tolerating ATM without difficulty 3/31 tr back to ICU -trach changed to 6 cuffless, bronched, new fever, started on antibiotics, pneumomediastinum small biapical pneumothoraces-stable 4/1 CRRT transient 4/5 started back on HD tiw, pneumothorax, pneumomediastinum resolved. Active tracheal bleeding> Bival held 4/6 reduced amiodarone to 200 mg daily, bival resumed 4/7 Off levo, tolerating trach collar.   Consults:  Neuro IR Cardiology  Nephrology Heart Failure  EP   Procedures:  2/5-2/6:  TPA given at 428 am (total of 90 Mg) 520 am went to IR  S/P Lt common carotid arteriogram followed by complete revascularization of occluded LT MCA sup division mid M2 seg with x 1 pass with 21mx 40 mm solitaire X ret river device and penumbra aspiration with TICI 3 revascularization  ETT 2/05 >> 2/18 [facility] CVL 2/5 >>2/18  RT [place] CVL 2/18 >>3/2 RIJ HD cath 2/6 >>2/18 6 shiley cuffed trach 2/18  >>3/4  RT fem HD 2/19 >>3/2 RIJ permacath 3/2>> 3/4 shiley 4 uncuffed.... dislodged placed back 6cuffed shiley that evening 3/5: change  to #6 cuffed distal XLT  3/17 right femoral CVL >> 3/23 3/17 right femoral arterial line >>3/23 3/31 bronchoscopy : airway clear, conclusion = end of trach up against the wall obstructing airflow   3/31 trach change to cuffed #6 XLT  4/5  back on t collar 24/7  And still levophed dep  4/6  PEG planned  Significant Diagnostic Tests:  CT angio head/neck 2/05 >> occlusion of Lt MCA bifurcation Echo 2/05 >> EF less than 20%, cannot rule out apical thrombus MRI brain 2/11 > extensive acute infarction of multiple areas without large or medium vessel occlusion Echocardiogram 2/8 Left ventricular ejection fraction, by estimation, is <20%. The left  ventricle has severely decreased function. The left ventricle demonstrates  global hypokinesis. The left ventricular internal cavity size was severely  dilated. Possible small 0.8 x 0.6 cm apical thrombus.   3/16 duplex upper and lower extremities >>neg  3/17 head CT >.  New area of hypoattenuation in the left pons , left frontal encephalomalacia related to old infarct 3/31CT chestreveals extensive pneumomediastinum and subcutaneous emphysema in the lower neck.  There is less than 5% bilateral pneumothoraces.  Extensive airspace disease right greater than left consistent with ARDS or infection  3/31 head CT >> neg for new pathology Micro Data:  SARS CoV2 PCR 2/05 >> negative Influenza PCR 2/05 >> negative ... Cdiff 2/25 >> neg Trach asp 3/11>> Few candida albicans BC 3/11>> 1 of 4 with Coag neg staph  BC 3/12 x 2 > neg  BC 3/17 x 2 > neg   trach 3/24 >  no  wbc, no organisms final - nl flora  Bronchoscopy 3/31  moderate GPC, normal respiratory flora Blood cultures 3/31 Neg   Antimicrobials:  mero 2/6 -2/12 Zosyn 2/6 , 2/19 >> 2/23 vanc 2/6 >> 2/9 , 2/19 >>2/22 Ceftriaxone 3/12> 3/16 3/17 vanc >> 3/19 3/17 Anidulafungin >> 3/19 3/17 cefepime >>  3/22  3/31 vancomycin>>4/3 3/31 cefepime>>4/3 Unasyn 4/3>>4/4  Interim history/subjective:   Off pressors On trach collar Tolerated dialysis yesterday Afebrile  Objective   Blood pressure 95/75, pulse 93, temperature 98.4 F (36.9 C), temperature source Axillary, resp. rate 19, height 6' 1"  (1.854 m), weight 108.3 kg,  SpO2 100 %.    FiO2 (%):  [28 %-40 %] 40 %   Intake/Output Summary (Last 24 hours) at 06/23/2019 0901 Last data filed at 06/23/2019 0800 Gross per 24 hour  Intake 1150.63 ml  Output 1000 ml  Net 150.63 ml   Filed Weights   06/22/19 1135 06/22/19 1505 06/23/19 0400  Weight: 111.8 kg 110.9 kg 108.3 kg        Physical Exam. Gen:   Young man no acute distress HEENT:  EOMI, sclera anicteric, Trach 6 cuffed Neck:     No masses; no thyromegaly Lungs:    Clear to auscultation bilaterally; normal respiratory effort CV:         Regular rate and rhythm; no murmurs Abd:      + bowel sounds; soft, non-tender; no palpable masses, no distension Ext:    No edema; adequate peripheral perfusion Skin:      Warm and dry; no rash Neuro: alert and oriented x 3 Psych: normal mood and affect  Chest x-ray 4/8 shows increased bilateral infiltrates and cardiomegaly compared to 4/5   Resolved Hospital Problem list   HCAP 0/92, Acute metabolic encephalopathy 2nd to hypoxia and renal failure, Cardiogenic shock HIT  Mixed Cardiogenic Septic shock, present on admission  Assessment &  Plan:   Acute on chronic respiratory failure Tracheostomy with intermittent bleeding Pneumomediastinum, pneumothoraces> resolved by pCXR 4/5  -note component of Cheyne-Stokes breathing Back on trach collar since 4/4   Acute metabolic encephalopathy Multifactorial but largely due to multiple strokes Using ativan > fentanyl IV prn  Abilify daily  AKI from ATN in setting of hypoxia, cardiogenic shock. HD m-w-f , tolerated 4/7   Plan Continue dialysis per nephrology.    Recurrent aspiration pneumonia Observe off antibiotics PEG Plan today   Chronic systolic CHF, non-ischemic EF 15% Polymorphic VT. Apical thrombus in left ventricle. Poor prognosis Per heart failure team. Not a candidate for advanced mechanical support. Currently on ASA Lipitor  -continue midodrine 10 mg tid (max dose)  - reduced amiodarone to  200 mg daily 4/6   HIT Bivalirudin on hold for PEG procedure - can change to Coumadin on 4/9   Lt MCA CVA likely embolic. Left [place] CVA, ?new noted on 3/17 Hx of cocaine abuse, depression. Plan Continue supportive care, statin  Anemia of critical illness  / acute trach bleeding 4/5 am improved am 4/6  Transfuse only for hemoglobin 7 or lower at this point    DM type II poorly controlled with hyperglycemia. CBG (last 3)  Continue current Levemir and sliding scale insulin And D10 while n.p.o.  Deconditioning Resume PT/OT  Goals of care Palliative care on board. Continue discussions with the family. Remains full code for now. Will likely need LTAC once critical issues are addressed.   Best practice:  Diet: Tube feeds when access is obtained   Pain/Anxiety/Delirium protocol (if indicated): prn fentanyl/ ativan  VAP protocol (if indicated): ordered DVT prophylaxis:  PAS hose, Bival GI prophylaxis: PPI Glucose control: at goal<180 Foley: minimal uop/ no foley needed with residual of 60 cc am 4/5  Mobility: bed rest Code Status: Full Family Communication: mother visits 10 am daily  Disposition:  ICU. Can transfer out after PEG  [name] MD. FCCP. [facility]  If no response to pager , please call [phone]    06/23/2019, 9:01 AM

## 2019-06-23 NOTE — Progress Notes (Addendum)
ANTICOAGULATION CONSULT NOTE  Pharmacy Consult:  Bivalirudin  Indication: stroke 2/5, LV thrombus 2/8, HIT 2/15  Allergies  Allergen Reactions  . Heparin Other (See Comments)    Heparin induced thrombocytopenia. 2/15 HIT OD 1.692. 2/16 SRA positive-90.     Patient Measurements: Height: 6\' 1"  (185.4 cm) Weight: 108.3 kg (238 lb 12.1 oz) IBW/kg (Calculated) : 79.9   Vital Signs: Temp: 97.9 F (36.6 C) (04/08 1700) Temp Source: Oral (04/08 1200) BP: 95/71 (04/08 1615) Pulse Rate: 91 (04/08 1615)  Labs: Recent Labs    06/21/19 6433 06/21/19 2951 06/21/19 1624 06/21/19 2309 06/22/19 0427 06/22/19 1712 06/23/19 0745 06/23/19 0828  HGB 8.9*   < >  --   --  8.2*  --   --  8.9*  HCT 30.6*  --   --   --  28.2*  --   --  29.9*  PLT 190  --   --   --  193  --   --  182  APTT  --   --    < > 63* 58*  --  48*  --   LABPROT 14.7  --   --   --  17.1*  --  17.2*  --   INR 1.2  --   --   --  1.4*  --  1.4*  --   CREATININE 2.82*   < >   < >  --  3.59* 2.05*  --  2.55*   < > = values in this interval not displayed.    Estimated Creatinine Clearance: 52.2 mL/min (A) (by C-G formula based on SCr of 2.55 mg/dL (H)).  Assessment: 34 yr old male with history of ESRD on HD presented with left MCA infarct - received TPA and revascularization with IR on 2/5. On 2/8 found to have small LV apical thrombus on ECHO. Pharmacy consulted to dose IV heparin, which was switched to bivalirudin 2/15 for HIT. 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 SRA very positive at 37. Heparin allergy has appropriately been added to chart and updated with SRA results. Transitioned off bivalrudin on 3/14. Restarted bivalirudin 3/26 and briefly held on 3/31 for chest tube placement.   Bivalirudin was held on 4/5 due to significant bleeding from trach and patient was intended to receive PEG on 4/6; however procedure was delayed and bivalirudin resumed targeting lower end of therapeutic  range due to trach bleeding. Bivalirudin held again 4/8 AM for PEG procedure.  PEG placement has now been completed 4/8 PM. No bleeding or issues with infusion per discussion with RN. Bivalirudin will be resumed immediately post-op per discussion with Dr. Elsworth Soho. CBC stable.  Will resume bivalirudin at previous rate despite low level this morning due to uncertainty surrounding when level was drawn in relation to bivalirudin stop time.   Goal of Therapy:  aPTT 50-65 seconds per discussion with Dr. Vaughan Browner Monitor platelets by anticoagulation protocol: Yes   Plan:  Resume bivalirudin post-PEG placement at previous rate 0.052 mg/kg/hr Check 2hr aPTT Monitor bleeding from trach site  Monitor daily aPTT, CBC Follow up starting warfarin tomorrow   Arturo Morton, PharmD, BCPS Please check AMION for all [place] contact numbers Clinical Pharmacist 06/23/2019 5:26 PM    ADDENDUM:  2-hr aPTT is therapeutic at 52 seconds after resumption of bivalirudin this afternoon after PEG placement. Will check confirmatory aPTT in 2 hrs. No active bleed issues reported.   Arturo Morton, PharmD, BCPS Please  check AMION for all [place] contact numbers Clinical Pharmacist 06/23/2019 9:13 PM

## 2019-06-23 NOTE — Progress Notes (Signed)
ANTICOAGULATION CONSULT NOTE  Pharmacy Consult:  Bivalirudin  Indication: stroke 2/5, LV thrombus 2/8, HIT 2/15  Allergies  Allergen Reactions  . Heparin Other (See Comments)    Heparin induced thrombocytopenia. 2/15 HIT OD 1.692. 2/16 SRA positive-90.     Patient Measurements: Height: 6\' 1"  (185.4 cm) Weight: 108.3 kg (238 lb 12.1 oz) IBW/kg (Calculated) : 79.9   Vital Signs: Temp: 97.6 F (36.4 C) (04/08 0800) Temp Source: Oral (04/08 0800) BP: 95/75 (04/08 0825) Pulse Rate: 93 (04/08 0825)  Labs: Recent Labs    06/21/19 4268 06/21/19 3419 06/21/19 1624 06/21/19 1846 06/21/19 2309 06/22/19 0427 06/22/19 1712 06/23/19 0745 06/23/19 0828  HGB 8.9*   < >  --   --   --  8.2*  --   --  8.9*  HCT 30.6*  --   --   --   --  28.2*  --   --  29.9*  PLT 190  --   --   --   --  193  --   --  182  APTT  --   --  53*   < > 63* 58*  --  48*  --   LABPROT 14.7  --   --   --   --  17.1*  --  17.2*  --   INR 1.2  --   --   --   --  1.4*  --  1.4*  --   CREATININE 2.82*   < > 3.34*  --   --  3.59* 2.05*  --   --    < > = values in this interval not displayed.    Estimated Creatinine Clearance: 64.9 mL/min (A) (by C-G formula based on SCr of 2.05 mg/dL (H)).  Assessment: 34 yr old male with history of ESRD on HD presented with left MCA infarct - received TPA and revascularization with IR on 2/5. On 2/8 found to have small LV apical thrombus on ECHO. Pharmacy consulted to dose IV heparin, which was switched to bivalirudin 2/15 for HIT. 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 SRA very positive at 47. Heparin allergy has appropriately been added to chart and updated with SRA results. Transitioned off bivalrudin on 3/14. Restarted bivalirudin 3/26 and briefly held on 3/31 for chest tube placement.   Bivalirudin was held on 4/5 due to significant bleeding from trach and patient was intended to receive PEG on 4/6. PEG placement has been delayed at this  time.Per discussion with Dr. Vaughan Browner  bivalirudin was resumed with targeting the lower end of the therapeutic range due to trach bleeding.   APTT remains slightly subtherapeutic at 48 on bivalirudin 0.052 mg/kg/hr. Per RN aPTT was drawn around the same time bivalirudin was stopped this morning in anticipation of PEG placement today at 1200. Patient only with minimal blood with suctioning and no other bleeding noted. CBC stable.    Goal of Therapy:  aPTT 50-65 seconds per discussion with Dr. Vaughan Browner Monitor platelets by anticoagulation protocol: Yes   Plan:  Follow up resuming bivalirudin after PEG placement Follow up starting warfarin tomorrow  Monitor bleeding from trach site  Monitor aPTT, CBC, and S/S of bleeding daily   Cristela Felt, PharmD PGY1 Pharmacy Resident [employer]: [phone] 06/23/2019 9:48 AM

## 2019-06-23 NOTE — Progress Notes (Signed)
Inpatient Diabetes Program Recommendations  AACE/ADA: New Consensus Statement on Inpatient Glycemic Control   Target Ranges:  Prepandial:   less than 140 mg/dL      Peak postprandial:   less than 180 mg/dL (1-2 hours)      Critically ill patients:  140 - 180 mg/dL   Results for TYSON, PARKISON (MRN [id number]) as of 06/23/2019 10:40  Ref. Range 06/22/2019 23:47 06/23/2019 03:35 06/23/2019 04:13 06/23/2019 08:36 06/23/2019 09:41  Glucose-Capillary Latest Ref Range: 70 - 99 mg/dL 142 (H) 69 (L) 114 (H) 67 (L) 142 (H)  Results for CHAKA, JEFFERYS (MRN [id number]) as of 06/23/2019 10:40  Ref. Range 06/22/2019 08:36 06/22/2019 12:08 06/22/2019 15:54 06/22/2019 19:52 06/22/2019 23:47  Glucose-Capillary Latest Ref Range: 70 - 99 mg/dL 120 (H) 96 112 (H) 101 (H) 142 (H)  Results for LENIN, KUHNLE (MRN [id number]) as of 06/23/2019 10:40  Ref. Range 06/21/2019 08:06 06/21/2019 11:28 06/21/2019 15:44 06/21/2019 19:55 06/21/2019 23:33  Glucose-Capillary Latest Ref Range: 70 - 99 mg/dL 133 (H) 78 154 (H) 77 126 (H)   Review of Glycemic Control  Current orders for Inpatient glycemic control: Levemir 40 units BID, Novolog 7 units Q4H for TF coverage, Novolog 0-20 units Q4H; D10 @ 30 ml/hr (while NPO), Vital @ 60 ml/hr  Inpatient Diabetes Program Recommendations:   Insulin - Basal: Please consider decreasing Levemir slightly to 38 units BID.  Thanks, Barnie Alderman, RN, MSN, CDE Diabetes Coordinator Inpatient Diabetes Program [phone] (Team Pager from 8am to 5pm)

## 2019-06-23 NOTE — Procedures (Signed)
Interventional Radiology Procedure Note  Procedure: Placement of percutaneous 20F pull-through gastrostomy tube. Complications: None Recommendations: - NPO except for sips and chips remainder of today and overnight - Maintain G-tube to LWS until tomorrow morning  - May advance diet as tolerated and begin using tube tomorrow morning  Signed,   Sanvika Cuttino S. Julicia Krieger, DO   

## 2019-06-23 NOTE — Progress Notes (Signed)
Physical Therapy Treatment Patient Details Name: Carlos Brewer. MRN: [id number] DOB: 16-Feb-1986 Today's Date: 06/23/2019    History of Present Illness Pt is a 34 y/o male smoker who initially presented on 2/5 with slurred speech and Rt sided weakness. Admitted with left MCA CVA and multiple smaller embolic infarcts s/p tPA and thrombectomy in IR.  Additionally found to have left ventricular  thrombus. Hospital course complicated by septic shock r/t HCAP, AKI requiring CRRT, polymorphic VT and cardiogenic shock. Echo EF < 20%. Pt with Intermittent pressor dependence, required tracheostomy. Tolerating trach collar 3/4, back on vent 3/5 with flash pulmonary edema.  Started IHD 3/10.    PT Comments    Pt limited by fatigue and profound weakness. Pt fatigues quickly and demonstrates more difficulty following commands with fatigue. Pt demonstrates significant weakness in all extremities and is only able to maintain attention for LE exercise for a 2-3 repetitions at a time before becoming distracted. Pt with significant increase in RR up into 40s with unsupported sitting in bed at this time. Pt will continue to benefit from acute PT POC to improve activity tolerance and attempt to reduce caregiver burden.   Follow Up Recommendations  LTACH     Equipment Recommendations  (defer to post-acute setting)    Recommendations for Other Services       Precautions / Restrictions Precautions Precautions: Fall Precaution Comments: trach collar, watch BP, NG Restrictions Weight Bearing Restrictions: No    Mobility  Bed Mobility Overal bed mobility: Needs Assistance Bed Mobility: (supine to long sit in bed in bed egress)           General bed mobility comments: pt requires totalA to pull forward into unsupported sitting in bed egress mode  Transfers                    Ambulation/Gait                 Stairs             Wheelchair Mobility    Modified Rankin  (Stroke Patients Only) Modified Rankin (Stroke Patients Only) Pre-Morbid Rankin Score: No symptoms Modified Rankin: Severe disability     Balance Overall balance assessment: Needs assistance Sitting-balance support: Feet supported;Single extremity supported(unsupported sitting in bed egress mode) Sitting balance-Leahy Scale: Zero Sitting balance - Comments: pt requires totalA to pull forward into unsupported sitting and totalA to maintain unsupported sitting Postural control: Posterior lean;Right lateral lean                                  Cognition Arousal/Alertness: Awake/alert Behavior During Therapy: Flat affect Overall Cognitive Status: Impaired/Different from baseline Area of Impairment: Attention;Memory;Following commands;Safety/judgement;Awareness;Problem solving                   Current Attention Level: Alternating Memory: Decreased recall of precautions;Decreased short-term memory Following Commands: Follows one step commands inconsistently;Follows one step commands with increased time Safety/Judgement: Decreased awareness of safety;Decreased awareness of deficits   Problem Solving: Slow processing;Decreased initiation;Requires verbal cues;Requires tactile cues General Comments: pt following roughly 50% of commands initially with frequent verbal cues, pt with less frequent following of commands with fatigue      Exercises General Exercises - Lower Extremity Ankle Circles/Pumps: AROM;Both;10 reps Quad Sets: AROM;Both;5 reps Hip ABduction/ADduction: AAROM;Both;5 reps Other Exercises Other Exercises: Hip internal rotation, extrenal rotation 5 reps PROM bilaterally  General Comments General comments (skin integrity, edema, etc.): pt with labile RR during session ranging from 14 to 41 with activity, PT frequently encouraging slowed deep breaths. Pt with stable BP, 90s/60s during session. Pt on trach collar 10L 40% FiO2      Pertinent  Vitals/Pain Pain Assessment: Faces Faces Pain Scale: Hurts even more Pain Location: generalized Pain Descriptors / Indicators: Discomfort;Restless Pain Intervention(s): Monitored during session    Home Living                      Prior Function            PT Goals (current goals can now be found in the care plan section) Acute Rehab PT Goals Patient Stated Goal: to d/c home with mother after rehab Progress towards PT goals: Progressing toward goals(very slowly)    Frequency    Min 3X/week      PT Plan Current plan remains appropriate    Co-evaluation              AM-PAC PT "6 Clicks" Mobility   Outcome Measure  Help needed turning from your back to your side while in a flat bed without using bedrails?: Total Help needed moving from lying on your back to sitting on the side of a flat bed without using bedrails?: Total Help needed moving to and from a bed to a chair (including a wheelchair)?: Total Help needed standing up from a chair using your arms (e.g., wheelchair or bedside chair)?: Total Help needed to walk in hospital room?: Total Help needed climbing 3-5 steps with a railing? : Total 6 Click Score: 6    End of Session Equipment Utilized During Treatment: Oxygen Activity Tolerance: Patient limited by fatigue Patient left: in bed;with call bell/phone within reach;with bed alarm set Nurse Communication: Mobility status PT Visit Diagnosis: Other abnormalities of gait and mobility (R26.89);Other symptoms and signs involving the nervous system (R29.898);Muscle weakness (generalized) (M62.81)     Time: 9735-3299 PT Time Calculation (min) (ACUTE ONLY): 29 min  Charges:  $Therapeutic Exercise: 8-22 mins $Therapeutic Activity: 8-22 mins                     Zenaida Niece, PT, DPT Acute Rehabilitation Pager: [phone]    Zenaida Niece 06/23/2019, 9:15 AM

## 2019-06-24 DIAGNOSIS — I639 Cerebral infarction, unspecified: Secondary | ICD-10-CM | POA: Diagnosis not present

## 2019-06-24 LAB — MAGNESIUM: Magnesium: 1.7 mg/dL (ref 1.7–2.4)

## 2019-06-24 LAB — RENAL FUNCTION PANEL
Albumin: 2.5 g/dL — ABNORMAL LOW (ref 3.5–5.0)
Albumin: 2.4 g/dL — ABNORMAL LOW (ref 3.5–5.0)
Anion gap: 16 — ABNORMAL HIGH (ref 5–15)
Anion gap: 15 (ref 5–15)
BUN: 47 mg/dL — ABNORMAL HIGH (ref 6–20)
BUN: 26 mg/dL — ABNORMAL HIGH (ref 6–20)
CO2: 25 mmol/L (ref 22–32)
CO2: 25 mmol/L (ref 22–32)
Calcium: 9.4 mg/dL (ref 8.9–10.3)
Calcium: 8.8 mg/dL — ABNORMAL LOW (ref 8.9–10.3)
Chloride: 99 mmol/L (ref 98–111)
Chloride: 98 mmol/L (ref 98–111)
Creatinine, Ser: 2.97 mg/dL — ABNORMAL HIGH (ref 0.61–1.24)
Creatinine, Ser: 2.07 mg/dL — ABNORMAL HIGH (ref 0.61–1.24)
GFR calc Af Amer: 30 mL/min — ABNORMAL LOW (ref >60)
GFR calc Af Amer: 47 mL/min — ABNORMAL LOW (ref >60)
GFR calc non Af Amer: 26 mL/min — ABNORMAL LOW (ref >60)
GFR calc non Af Amer: 40 mL/min — ABNORMAL LOW (ref >60)
Glucose, Bld: 92 mg/dL (ref 70–99)
Glucose, Bld: 127 mg/dL — ABNORMAL HIGH (ref 70–99)
Phosphorus: 4.3 mg/dL (ref 2.5–4.6)
Phosphorus: 2.4 mg/dL — ABNORMAL LOW (ref 2.5–4.6)
Potassium: 3.8 mmol/L (ref 3.5–5.1)
Potassium: 3.6 mmol/L (ref 3.5–5.1)
Sodium: 140 mmol/L (ref 135–145)
Sodium: 138 mmol/L (ref 135–145)

## 2019-06-24 LAB — BASIC METABOLIC PANEL
Anion gap: 17 — ABNORMAL HIGH (ref 5–15)
BUN: 48 mg/dL — ABNORMAL HIGH (ref 6–20)
CO2: 25 mmol/L (ref 22–32)
Calcium: 9.4 mg/dL (ref 8.9–10.3)
Chloride: 97 mmol/L — ABNORMAL LOW (ref 98–111)
Creatinine, Ser: 3.06 mg/dL — ABNORMAL HIGH (ref 0.61–1.24)
GFR calc Af Amer: 29 mL/min — ABNORMAL LOW (ref >60)
GFR calc non Af Amer: 25 mL/min — ABNORMAL LOW (ref >60)
Glucose, Bld: 94 mg/dL (ref 70–99)
Potassium: 3.7 mmol/L (ref 3.5–5.1)
Sodium: 139 mmol/L (ref 135–145)

## 2019-06-24 LAB — CBC
HCT: 30.9 % — ABNORMAL LOW (ref 39.0–52.0)
Hemoglobin: 9.1 g/dL — ABNORMAL LOW (ref 13.0–17.0)
MCH: 30.4 pg (ref 26.0–34.0)
MCHC: 29.4 g/dL — ABNORMAL LOW (ref 30.0–36.0)
MCV: 103.3 fL — ABNORMAL HIGH (ref 80.0–100.0)
Platelets: 204 10*3/uL (ref 150–400)
RBC: 2.99 MIL/uL — ABNORMAL LOW (ref 4.22–5.81)
RDW: 19.4 % — ABNORMAL HIGH (ref 11.5–15.5)
WBC: 6 10*3/uL (ref 4.0–10.5)
nRBC: 0 % (ref 0.0–0.2)

## 2019-06-24 LAB — APTT
aPTT: 38 seconds — ABNORMAL HIGH (ref 24–36)
aPTT: 55 seconds — ABNORMAL HIGH (ref 24–36)
aPTT: 67 seconds — ABNORMAL HIGH (ref 24–36)

## 2019-06-24 LAB — GLUCOSE, CAPILLARY
Glucose-Capillary: 90 mg/dL (ref 70–99)
Glucose-Capillary: 69 mg/dL — ABNORMAL LOW (ref 70–99)
Glucose-Capillary: 101 mg/dL — ABNORMAL HIGH (ref 70–99)
Glucose-Capillary: 113 mg/dL — ABNORMAL HIGH (ref 70–99)
Glucose-Capillary: 141 mg/dL — ABNORMAL HIGH (ref 70–99)

## 2019-06-24 LAB — PROTIME-INR
INR: 1.4 — ABNORMAL HIGH (ref 0.8–1.2)
Prothrombin Time: 16.8 seconds — ABNORMAL HIGH (ref 11.4–15.2)

## 2019-06-24 MED ORDER — LIDOCAINE HCL (PF) 1 % IJ SOLN: 5.0000 mL | INTRAMUSCULAR | Status: DC | PRN

## 2019-06-24 MED ORDER — ALTEPLASE 2 MG IJ SOLR
2.0000 mg | Freq: Once | INTRAMUSCULAR | Status: DC | PRN
  Filled 2019-06-24: qty 2

## 2019-06-24 MED ORDER — WARFARIN SODIUM 3 MG PO TABS
3.0000 mg | ORAL_TABLET | Freq: Once | ORAL | Status: AC
  Administered 2019-06-24: 15:00:00 3 mg via ORAL
  Filled 2019-06-24: qty 1

## 2019-06-24 MED ORDER — SODIUM CHLORIDE 0.9 % IV SOLN: 100.0000 mL | INTRAVENOUS | Status: DC | PRN

## 2019-06-24 MED ORDER — ANTICOAGULANT SODIUM CITRATE 4% (200MG/5ML) IV SOLN
5.0000 mL | Status: DC | PRN
  Filled 2019-06-24 (×7): qty 5

## 2019-06-24 MED ORDER — ALPRAZOLAM 0.5 MG PO TABS
0.5000 mg | ORAL_TABLET | Freq: Every evening | ORAL | Status: DC | PRN
  Administered 2019-06-25 – 2019-07-08 (×4): 0.5 mg
  Filled 2019-06-24 (×4): qty 1

## 2019-06-24 MED ORDER — LIDOCAINE-PRILOCAINE 2.5-2.5 % EX CREA: 1.0000 "application " | TOPICAL_CREAM | CUTANEOUS | Status: DC | PRN

## 2019-06-24 MED ORDER — PENTAFLUOROPROP-TETRAFLUOROETH EX AERO
1.0000 "application " | INHALATION_SPRAY | CUTANEOUS | Status: DC | PRN
  Filled 2019-06-24: qty 116

## 2019-06-24 MED ORDER — WARFARIN - PHARMACIST DOSING INPATIENT: Freq: Every day | Status: DC

## 2019-06-24 NOTE — Progress Notes (Signed)
  NAME:  Carlos Alvin Groleau Jr., MRN:  [id number], DOB:  04/12/1985, LOS: 63 ADMISSION DATE:  05/09/2019, CONSULTATION DATE:  05/14/2019 REFERRING MD:  Dr. Kirkpatrick, CHIEF COMPLAINT:  Slurred speech  Brief History   34 yo male smoker found to have slurred speech and Rt sided weakness.  Admitted with left MCA CVA and multiple smaller embolic infarcts>>>  tPA and thrombectomy in IR.  Additionally found to have left ventricular  thrombus.    Course complicated by septic shock r/t HCAP, AKI requiring CRRT, polymorphic VT and cardiogenic shock.  Intermittent pressor dependence, required tracheostomy Intermittent hemodialysis [place] CVA Heart failure with reduced ejection fraction < 15% on last echocardiogram  Past Medical History  Systolic CHF with non ischemic CM, Cocaine abuse, OSA, DM Hx of CHF (EF 15%)  Hx myocarditis Smoker 1/2 ppd   Significant Hospital Events   2/05 Admit, tPA, IR thrombectomy 2/6 Shock requiring increasing levophed and phenylephrine.   2/6: switched to levophed, epi, started antibiotics, started on CRRT.  2/9  developed wide-complex tachycardia and hypotension, started on amiodarone drip, increase Levophed drip 2/15 drop in platelets, heparin stopped, bival started 2/17 Hypotensive and back on Levophed 2/18 trach, lines changed 2/19 started milrinone , fever 103 2/20 Episode of wide-complex tachycardia yesterday, changed from Levophed to vasopressin 2/22 stopping vanc. Still on inotrope support. Some AF w/ RVR. Had to be placed back on pressors. ivabradine added 2/23 still on pressors/ CRRT continued 2/24 tmax 98.4, Remains on CRRT, even UF, remains anuric, Levophed at 14 mcg/min, Milrinone at 0.125 mcg/kg/min, Coox 63.4 Doing well this morning on ATC, no events overnight.  Midodrine added   2/25 more interactive, remains on ATC, tmax 99.5/ WBC 21.4, ~900 ml liquid stool/ 24 hours, neg Cdiff, Levophed up to 20 mcg/min, CVP 2, ,milrinone remains at 0.125  mcg/kg/min, coox 92.7, Off CRRT since last night ~2000 s/p clotted x 3 off citrate, restarted  3/1 Amio drip restarted for WCT/atrial flutter, milrinone turned off 3/2 CRRT stopped, permacath placed 3/5 crrt resumed 3/6 started on pressors for crrt tolerance 3/8 No longer on CRRT or pressor support  3/12 Tolerated PS for >2 hours  3/17  febrile 106 overnight, shock on pressors, new lines placed, broad-spectrum antibiotics 3/22: Awake, appears comfortable on pressure support ventilation. weaning Precedex, he is back on low-dose norepinephrine 3/23 pm back on vent at hs for resp distress / desats ? chf on cxr  3/24 t collar 24/7 3/29 tolerating ATM without difficulty 3/31 tr back to ICU -trach changed to 6 cuffless, bronched, new fever, started on antibiotics, pneumomediastinum small biapical pneumothoraces-stable 4/1 CRRT transient 4/5 started back on HD tiw, pneumothorax, pneumomediastinum resolved. Active tracheal bleeding> Bival held 4/6 reduced amiodarone to 200 mg daily, bival resumed 4/7 Off levo, tolerating trach collar.  4/9 transfer out of ICU  Consults:  Neuro IR Cardiology  Nephrology Heart Failure  EP   Procedures:  2/5-2/6:  TPA given at 428 am (total of 90 Mg) 520 am went to IR  S/P Lt common carotid arteriogram followed by complete revascularization of occluded LT MCA sup division mid M2 seg with x 1 pass with 4mmx 40 mm solitaire X ret river device and penumbra aspiration with TICI 3 revascularization  ETT 2/05 >> 2/18 [facility] CVL 2/5 >>2/18  RT [place] CVL 2/18 >>3/2 RIJ HD cath 2/6 >>2/18 6 shiley cuffed trach 2/18  >>3/4  RT fem HD 2/19 >>3/2 RIJ permacath 3/2>> 3/4 shiley 4 uncuffed.... dislodged placed back 6cuffed   shiley that evening 3/5: change to #6 cuffed distal XLT  3/17 right femoral CVL >> 3/23 3/17 right femoral arterial line >>3/23 3/31 bronchoscopy : airway clear, conclusion = end of trach up against the wall obstructing airflow   3/31 trach change  to cuffed #6 XLT  4/7 PEG by IR  Significant Diagnostic Tests:  CT angio head/neck 2/05 >> occlusion of Lt MCA bifurcation Echo 2/05 >> EF less than 20%, cannot rule out apical thrombus MRI brain 2/11 > extensive acute infarction of multiple areas without large or medium vessel occlusion Echocardiogram 2/8 Left ventricular ejection fraction, by estimation, is <20%. The left  ventricle has severely decreased function. The left ventricle demonstrates  global hypokinesis. The left ventricular internal cavity size was severely  dilated. Possible small 0.8 x 0.6 cm apical thrombus.   3/16 duplex upper and lower extremities >>neg  3/17 head CT >.  New area of hypoattenuation in the left pons , left frontal encephalomalacia related to old infarct 3/31CT chestreveals extensive pneumomediastinum and subcutaneous emphysema in the lower neck.  There is less than 5% bilateral pneumothoraces.  Extensive airspace disease right greater than left consistent with ARDS or infection  3/31 head CT >> neg for new pathology Micro Data:  SARS CoV2 PCR 2/05 >> negative Influenza PCR 2/05 >> negative ... Cdiff 2/25 >> neg Trach asp 3/11>> Few candida albicans BC 3/11>> 1 of 4 with Coag neg staph  BC 3/12 x 2 > neg  BC 3/17 x 2 > neg   trach 3/24 >  no  wbc, no organisms final - nl flora  Bronchoscopy 3/31  moderate GPC, normal respiratory flora Blood cultures 3/31 Neg   Antimicrobials:  mero 2/6 -2/12 Zosyn 2/6 , 2/19 >> 2/23 vanc 2/6 >> 2/9 , 2/19 >>2/22 Ceftriaxone 3/12> 3/16 3/17 vanc >> 3/19 3/17 Anidulafungin >> 3/19 3/17 cefepime >>  3/22  3/31 vancomycin>>4/3 3/31 cefepime>>4/3 Unasyn 4/3>>4/4  Interim history/subjective:   No acute events. Remains on trach collar.   Objective   Blood pressure (!) 91/48, pulse 90, temperature 97.8 F (36.6 C), temperature source Oral, resp. rate (!) 27, height 6' 1" (1.854 m), weight 108.6 kg, SpO2 100 %.    FiO2 (%):  [40 %] 40 %   Intake/Output  Summary (Last 24 hours) at 06/24/2019 0938 Last data filed at 06/24/2019 0800 Gross per 24 hour  Intake 2266.6 ml  Output 400 ml  Net 1866.6 ml   Filed Weights   06/22/19 1505 06/23/19 0400 06/24/19 0700  Weight: 110.9 kg 108.3 kg 108.6 kg        Physical Exam. Gen:      No acute distress HEENT:  EOMI, sclera anicteric Neck:     No masses; no thyromegaly, trach Lungs:    Clear to auscultation bilaterally; normal respiratory effort CV:         Regular rate and rhythm; no murmurs Abd:      + bowel sounds; soft, non-tender; no palpable masses, no distension Ext:    No edema; adequate peripheral perfusion Skin:      Warm and dry; no rash Neuro: Awake, responsive  Labs significant for BUN/creatinine 47/2.97, hemoglobin 9.1 No new imaging.   Resolved Hospital Problem list   HCAP 2/19, Acute metabolic encephalopathy 2nd to hypoxia and renal failure, Cardiogenic shock HIT  Mixed Cardiogenic Septic shock, present on admission  Assessment & Plan:   Acute on chronic respiratory failure Tracheostomy with intermittent bleeding Pneumomediastinum, pneumothoraces> resolved by pCXR 4/5  -  note component of Cheyne-Stokes breathing Back on trach collar since 4/4 Continue routine trach care  Acute metabolic encephalopathy Multifactorial but largely due to multiple strokes Xanax, fentanyl PRN Abilify, prozac daily  AKI from ATN in setting of hypoxia, cardiogenic shock. HD m-w-f ,  Continue dialysis per nephrology.   Recurrent aspiration pneumonia Observe off antibiotics  Chronic systolic CHF, non-ischemic EF 15% Polymorphic VT. Apical thrombus in left ventricle. Poor prognosis Per heart failure team. Not a candidate for advanced mechanical support. Currently on ASA Lipitor  continue midodrine 10 mg tid (max dose)  reduced amiodarone to 200 mg daily 4/6   HIT Resumed Bival Start coumadin per pharmacy  Lt MCA CVA likely embolic. Left [place] CVA, ?new noted on 3/17 Hx of  cocaine abuse, depression. Plan Continue supportive care, statin  Anemia of critical illness  / acute trach bleeding 4/5 am improved am 4/6  Transfuse only for hemoglobin 7 or lower at this point    DM type II poorly controlled with hyperglycemia. CBG (last 3)  Continue current Levemir and sliding scale insulin  Deconditioning Resume PT/OT  Goals of care Palliative care on board. Continue discussions with the family. Remains full code for now. Will likely need LTAC placement. Case management consulted  Best practice:  Diet: Tube feeds when access is obtained   Pain/Anxiety/Delirium protocol (if indicated): prn fentanyl/ ativan  VAP protocol (if indicated): ordered DVT prophylaxis:  PAS hose, Bival GI prophylaxis: PPI Glucose control: at goal<180 Foley: minimal uop/ no foley needed with residual of 60 cc am 4/5  Mobility: bed rest Code Status: Full Family Communication: mother visits 10 am daily  Disposition:  Out of ICU and to [facility]. PCCM to follow for trach care  Marshell Garfinkel MD [facility] Please see [email or web address] for pager details.  06/24/2019, 9:39 AM

## 2019-06-24 NOTE — Progress Notes (Signed)
[facility] NEPHROLOGY PROGRESS NOTE  Assessment/ Plan: Pt is a 34 y.o. yo male with history of CHF, myocarditis, and NICM, stroke, hospital course complicated by septic shock, HCAP and AKI requiring CRRT.  #Acute kidney injury multifactorial etiology including ATN following cardiogenic/septic shock; anuric and now dialysis dependent MWF. Discontinued CRRT on 4/4 and has been tolerating intermittent hemodialysis.  Last HD on 4/7 with 1 L UF, on midodrine for hypotension.  Receiving HD today with goal UF around 1 L, tolerating well so far. Overall prognosis is poor given no improvement in encephalopathy and remains vent and dialysis dependent.  Palliative care has seen the patient.  #Acute stroke: Likely embolic with bilateral infarcts and LV thrombus.  Has residual encephalopathy.  Gastric tube placed by IR on 4/8.  #Hypoxic respiratory failure, prolonged ventilator dependent, has tracheostomy.  Pulmonary following.  #Shock: Off of levo.  Blood pressure soft.  On midodrine 10 mg 3 times daily.  #Anemia: Continue ESA.  No Iron because of concern of infection.  Transfuse as needed.  # CKD-MBD; on Renvela.  Phosphorus level acceptable. PTH 70.  Subjective: Seen and examined at bedside.  Receiving dialysis.  Systolic blood pressure around 90s.  Goal UF 1 kg.  Discussed with HD nurse.  No new event.  Objective Vital signs in last 24 hours: Vitals:   06/24/19 0930 06/24/19 0945 06/24/19 1000 06/24/19 1015  BP: (!) 91/48 (!) 78/65 95/73 97/87  Pulse: 90 99 100 (!) 109  Resp: (!) 27 (!) 44 14 (!) 46  Temp:      TempSrc:      SpO2: 100% 96% 97% 94%  Weight:      Height:       Weight change: -3.2 kg  Intake/Output Summary (Last 24 hours) at 06/24/2019 1022 Last data filed at 06/24/2019 0800 Gross per 24 hour  Intake 2248.21 ml  Output 400 ml  Net 1848.21 ml       Labs: Basic Metabolic Panel: Recent Labs  Lab 06/23/19 0828 06/23/19 2019 06/24/19 0403  NA 140 138  140  139  K 3.9 4.0 3.8  3.7  CL 99 99 99  97*  CO2 27 24 25  25  GLUCOSE 71 70 92  94  BUN 42* 45* 47*  48*  CREATININE 2.55* 2.77* 2.97*  3.06*  CALCIUM 9.0 9.2 9.4  9.4  PHOS 3.0 4.1 4.3   Liver Function Tests: Recent Labs  Lab 06/23/19 0828 06/23/19 2019 06/24/19 0403  ALBUMIN 2.4* 2.4* 2.5*   No results for input(s): LIPASE, AMYLASE in the last 168 hours. No results for input(s): AMMONIA in the last 168 hours. CBC: Recent Labs  Lab 06/20/19 0623 06/20/19 0623 06/21/19 0611 06/21/19 0611 06/22/19 0427 06/23/19 0828 06/24/19 0403  WBC 10.4   < > 10.1   < > 9.5 7.7 6.0  HGB 8.0*   < > 8.9*   < > 8.2* 8.9* 9.1*  HCT 27.6*   < > 30.6*   < > 28.2* 29.9* 30.9*  MCV 108.7*  --  105.2*  --  106.0* 104.9* 103.3*  PLT 224   < > 190   < > 193 182 204   < > = values in this interval not displayed.   Cardiac Enzymes: No results for input(s): CKTOTAL, CKMB, CKMBINDEX, TROPONINI in the last 168 hours. CBG: Recent Labs  Lab 06/23/19 1653 06/23/19 1942 06/23/19 2313 06/24/19 0316 06/24/19 0810  GLUCAP 76 71 76 90 69*      Iron Studies: No results for input(s): IRON, TIBC, TRANSFERRIN, FERRITIN in the last 72 hours. Studies/Results: IR GASTROSTOMY TUBE MOD SED  Result Date: 06/23/2019 INDICATION: 34 year old male with a history of dysphagia EXAM: PERC PLACEMENT GASTROSTOMY MEDICATIONS: 2 g Ancef; Antibiotics were administered within 1 hour of the procedure. Scratch ANESTHESIA/SEDATION: Versed 1.0 mg IV; Fentanyl 25 mcg IV Moderate Sedation Time:  16 minutes The patient was continuously monitored during the procedure by the interventional radiology nurse under my direct supervision. CONTRAST:  42m OMNIPAQUE IOHEXOL 300 MG/ML SOLN - administered into the gastric lumen. FLUOROSCOPY TIME:  Fluoroscopy Time: 7 minutes 24 seconds (150 mGy). COMPLICATIONS: None PROCEDURE: Informed written consent was obtained from the patient and the patient's family after a thorough discussion  of the procedural risks, benefits and alternatives. All questions were addressed. Maximal Sterile Barrier Technique was utilized including caps, mask, sterile gowns, sterile gloves, sterile drape, hand hygiene and skin antiseptic. A timeout was performed prior to the initiation of the procedure. The epigastrium was prepped with Betadine in a sterile fashion, and a sterile drape was applied covering the operative field. A sterile gown and sterile gloves were used for the procedure. A 5-French orogastric tube is placed under fluoroscopic guidance. Scout imaging of the abdomen confirms barium within the transverse colon. The stomach was distended with gas. Under fluoroscopic guidance, an 18 gauge needle was utilized to puncture the anterior wall of the body of the stomach. An Amplatz wire was advanced through the needle passing a T fastener into the lumen of the stomach. The T fastener was secured for gastropexy. A 9-French sheath was inserted. A snare was advanced through the 9-French sheath. A BBritta Mccreedywas advanced through the orogastric tube. It was snared then pulled out the oral cavity, pulling the snare, as well. The leading edge of the gastrostomy was attached to the snare. It was then pulled down the esophagus and out the percutaneous site. Tube secured in place. Contrast was injected. Patient tolerated the procedure well and remained hemodynamically stable throughout. No complications were encountered and no significant blood loss encountered. IMPRESSION: Status post fluoroscopic placed percutaneous gastrostomy tube, with 20 FPak[place]pull-through. Signed, JDulcy Fanny WEarleen Newport DO [facility] Electronically Signed   By: JCorrie MckusickD.O.   On: 06/23/2019 17:14   DG Chest Port 1 View  Result Date: 06/23/2019 CLINICAL DATA:  Acute respiratory failure. EXAM: PORTABLE CHEST 1 VIEW COMPARISON:  06/20/2019. FINDINGS: Interim removal of tracheostomy tube and  placement of endotracheal tube. Endotracheal tube tip 2 cm above the lower most portion of the carina. NG tube, right IJ line in stable position. Severe cardiomegaly again noted. Diffuse bilateral pulmonary infiltrates/edema. No pleural effusion or pneumothorax. IMPRESSION: 1. Interim removal of tracheostomy tube and placement of an endotracheal tube. Endotracheal tube tip 2 cm above the lower most portion of the carina. NG tube and right IJ line stable position. 2.  Severe cardiomegaly.  Again noted. 3.  Diffuse bilateral pulmonary infiltrates/edema. Electronically Signed   By: TMarcello Moores Register   On: 06/23/2019 08:09    Medications: Infusions: . sodium chloride    . sodium chloride    . anticoagulant sodium citrate    . anticoagulant sodium citrate    . bivalirudin (ANGIOMAX) infusion 0.5 mg/mL (Non-ACS indications) 0.057 mg/kg/hr (06/24/19 0601)  . dextrose 30 mL/hr at 06/23/19 1627  . feeding supplement (VITAL 1.5 CAL) 1,000 mL (06/24/19 0925)    Scheduled Medications: . amiodarone  200 mg Per Tube  Daily  . ARIPiprazole  10 mg Per Tube Daily  . aspirin  81 mg Per Tube Daily  . atorvastatin  40 mg Per Tube q1800  . chlorhexidine  15 mL Mouth Rinse BID  . chlorhexidine gluconate (MEDLINE KIT)  15 mL Mouth Rinse BID  . Chlorhexidine Gluconate Cloth  6 each Topical Q0600  . Chlorhexidine Gluconate Cloth  6 each Topical Q0600  . darbepoetin (ARANESP) injection - DIALYSIS  150 mcg Intravenous Q Mon-HD  . feeding supplement (PRO-STAT SUGAR FREE 64)  60 mL Per Tube BID  . FLUoxetine  20 mg Per Tube Daily  . free water  100 mL Per Tube Q4H  . insulin aspart  0-20 Units Subcutaneous Q4H  . insulin aspart  7 Units Subcutaneous Q4H  . insulin detemir  40 Units Subcutaneous BID  . mouth rinse  15 mL Mouth Rinse q12n4p  . midodrine  10 mg Per Tube Q8H  . neomycin-bacitracin-polymyxin  1 application Topical Daily  . pantoprazole sodium  40 mg Per Tube Q1200  . polyethylene glycol  17 g Per Tube  Daily  . sevelamer carbonate  2.4 g Per Tube TID WC  . sodium chloride flush  10-40 mL Intracatheter Q12H  . warfarin  3 mg Oral ONCE-1600  . Warfarin - Pharmacist Dosing Inpatient   Does not apply q1600    have reviewed scheduled and prn medications.  Physical Exam: General: On trach/vent, not in distress, lethargic today Heart:RRR, s1s2 nl, no rubs Lungs: Coarse breath sound bilateral Abdomen:soft, Non-tender, gastric feeding tube present. Extremities: Trace LE edema Dialysis Access: Right IJ TDC placed by IR on 3/2.  Zira Helinski Prasad Shakenya Stoneberg 06/24/2019,10:22 AM  LOS: 63 days  Pager: [phone]

## 2019-06-24 NOTE — Progress Notes (Addendum)
ANTICOAGULATION CONSULT NOTE  Pharmacy Consult:  Bivalirudin and Warfarin Indication: stroke 2/5, LV thrombus 2/8, HIT 2/15  Allergies  Allergen Reactions  . Heparin Other (See Comments)    Heparin induced thrombocytopenia. 2/15 HIT OD 1.692. 2/16 SRA positive-90.     Patient Measurements: Height: 6\' 1"  (185.4 cm) Weight: 108.3 kg (238 lb 12.1 oz) IBW/kg (Calculated) : 79.9   Vital Signs: Temp: 98.2 F (36.8 C) (04/09 0400) Temp Source: Axillary (04/09 0400) BP: 95/56 (04/09 0000) Pulse Rate: 98 (04/09 0321)  Labs: Recent Labs    06/22/19 0427 06/22/19 0427 06/22/19 1712 06/23/19 0745 06/23/19 0828 06/23/19 2019 06/24/19 0403  HGB 8.2*   < >  --   --  8.9*  --  9.1*  HCT 28.2*  --   --   --  29.9*  --  30.9*  PLT 193  --   --   --  182  --  204  APTT 58*  --   --  48*  --  52* 38*  LABPROT 17.1*  --   --  17.2*  --   --  16.8*  INR 1.4*  --   --  1.4*  --   --  1.4*  CREATININE 3.59*   < >   < >  --  2.55* 2.77* 2.97*  3.06*   < > = values in this interval not displayed.    Estimated Creatinine Clearance: 43.5 mL/min (A) (by C-G formula based on SCr of 3.06 mg/dL (H)).  Assessment: 34 yr old male with history of ESRD on HD presented with left MCA infarct - received TPA and revascularization with IR on 2/5. On 2/8 found to have small LV apical thrombus on ECHO. Pharmacy consulted to dose IV heparin, which was switched to bivalirudin 2/15 for HIT. 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 SRA very positive at 44. Heparin allergy has appropriately been added to chart and updated with SRA results. Transitioned off bivalrudin on 3/14. Restarted bivalirudin 3/26 and briefly held on 3/31 for chest tube placement.   Bivalirudin was held on 4/5 due to significant bleeding from trach and patient was intended to receive PEG on 4/6; however procedure was delayed and bivalirudin resumed targeting lower end of therapeutic range due to trach  bleeding. Bivalirudin held again 4/8 AM for PEG procedure and resumed after PEG placement on 4/8 PM.   aPTT this morning of 38 is subtherapeutic on bivalirudin 0.052mg /kghr (using a weight of 106.1kg). Per RN no issues with IV infusion or access. CBC stable.    Goal of Therapy:  aPTT 50-65 seconds per discussion with Dr. Vaughan Browner Monitor platelets by anticoagulation protocol: Yes   Plan:  Increase bivalirudin to 0.057mg /kg/hr Check 2hr aPTT Monitor bleeding from trach site  Monitor daily aPTT, CBC Follow up starting warfarin today  Cristela Felt, PharmD PGY1 Pharmacy Resident [employer]: [phone]  06/24/2019 5:57 AM  Addendum: aPTT 55 is therapeutic on bivalirudin 0.057mg /kg/hr. Per RN no bleeding noted and no issues with IV infusion. Pharmacy now consulted to resume warfarin now that PEG is placed and transition off bivalirudin when appropriate. INR 1.4 CBC stable.   Plan:  - Continue bivalirudin 0.057mg /kg/hr - Check aPTT at 1200 - Warfarin 3mg  x1 tonight  - Monitor INR and stop bivalirudin when INR > 3.0 and check aPTT - Monitor aPTT, INR, CBC, and S/S of bleeding daily   Addendum #2: Confirmatory aPTT on bivalirudin 0.057mg /kg/hr was 67 which is slightly above  our current targeted goal of 50-65 due to previous trach bleeding which has now improved with no bleeding today. After discussion with Dr. Vaughan Browner since bleeding has improved will now target our normal aPTT goal of 50-85.   Plan: - Continue bivalirudin 0.057mg /kg/hr - Check aPTT next daily due to 2 therapeutic aPTT on bivalirudin 0.057mg /kg/hr - Change target aPTT goal to 50-85

## 2019-06-24 NOTE — Progress Notes (Signed)
Referring Physician(s): Little Ishikawa  Supervising Physician: Daryll Brod  Patient Status:  [facility] - In-pt  Chief Complaint: None- tracheostomy  Subjective:  History of CVA with residual dysphasia s/p percutaneous gastrostomy tube placement in IR 06/23/2019 by Dr. Earleen Newport. Patient awake and alert laying in bed, tracheostomy in place. He responds to voice and nods to yes/no questions appropriately. Gastrostomy tube site c/d/i.   Allergies: Heparin  Medications: Prior to Admission medications   Medication Sig Start Date End Date Taking? Authorizing Provider  albuterol (ACCUNEB) 1.25 MG/3ML nebulizer solution Take 3 mLs by nebulization 4 (four) times daily as needed for wheezing or shortness of breath. 08/23/18  Yes [provider]  albuterol (VENTOLIN HFA) 108 (90 Base) MCG/ACT inhaler Inhale 1-2 puffs into the lungs every 6 (six) hours as needed for wheezing or shortness of breath. 07/17/18  Yes Florencia Reasons, MD  aspirin EC 81 MG EC tablet Take 1 tablet (81 mg total) by mouth daily. Patient taking differently: Take 81 mg by mouth at bedtime.  10/30/16  Yes Demetrios Loll, MD  atorvastatin (LIPITOR) 40 MG tablet Take 1 tablet (40 mg total) by mouth daily at 6 PM. 08/23/18  Yes Bensimhon, Shaune Pascal, MD  bacitracin ointment Apply 1 application topically 2 (two) times daily. Patient taking differently: Apply 1 application topically 2 (two) times daily as needed for wound care.  08/28/18  Yes Montine Circle, PA-C  carvedilol (COREG) 3.125 MG tablet Take 1 tablet (3.125 mg total) by mouth 2 (two) times daily with a meal. 08/23/18  Yes Bensimhon, Shaune Pascal, MD  digoxin (LANOXIN) 0.125 MG tablet Take 1 tablet (0.125 mg total) by mouth daily. 08/23/18  Yes Bensimhon, Shaune Pascal, MD  FLUoxetine (PROZAC) 20 MG capsule Take 1 capsule (20 mg total) by mouth daily. 12/19/18  Yes Gherghe, Vella Redhead, MD  insulin aspart (NOVOLOG) 100 UNIT/ML injection Inject 10 Units into the skin 3 (three) times daily before  meals.   Yes [provider]  insulin detemir (LEVEMIR) 100 UNIT/ML injection Inject 40 Units into the skin at bedtime.    Yes [provider]  metolazone (ZAROXOLYN) 2.5 MG tablet Take 1 tablet (2.5 mg total) by mouth daily as needed for up to 30 days. Take metolazone 2.5mg  x1 only for 5pound weight gain, please take potassium 15meq x1 with metolazone. Patient taking differently: Take 2.5 mg by mouth daily as needed (for 5 lb weight gain (take additional potassium 20 meq with each dose).  07/17/18 06/15/19 Yes Florencia Reasons, MD  Multiple Vitamin (MULTIVITAMIN WITH MINERALS) TABS tablet Take 1 tablet by mouth daily with lunch.   Yes [provider]  pantoprazole (PROTONIX) 40 MG tablet Take 40 mg by mouth daily as needed (acid reflux/indigestion).    Yes [provider]  potassium chloride SA (K-DUR) 20 MEQ tablet Take 1 tablet (20 mEq total) by mouth daily. Can take extra one if taking metolazone. Patient taking differently: Take 20 mEq by mouth See admin instructions. Take one tablet (20 meq) by mouth every morning, also take an extra tablet (20 meq) with each dose of metolazone 08/23/18  Yes Bensimhon, Shaune Pascal, MD  sacubitril-valsartan (ENTRESTO) 24-26 MG Take 1 tablet by mouth 2 (two) times daily. 08/23/18  Yes Bensimhon, Shaune Pascal, MD  spironolactone (ALDACTONE) 25 MG tablet Take 1 tablet (25 mg total) by mouth daily. Patient taking differently: Take 25 mg by mouth at bedtime.  08/23/18  Yes Bensimhon, Shaune Pascal, MD  torsemide (DEMADEX) 20 MG  tablet Take 2 tablets (40 mg total) by mouth 2 (two) times daily. Patient taking differently: Take 40 mg by mouth daily.  08/23/18  Yes Bensimhon, Shaune Pascal, MD  Vitamin D, Ergocalciferol, (DRISDOL) 1.25 MG (50000 UNIT) CAPS capsule Take 50,000 Units by mouth every Thursday.   Yes [provider]     Vital Signs: BP (!) 84/53   Pulse 98   Temp 97.8 F (36.6 C) (Oral)   Resp (!) 37   Ht 6\' 1"  (1.854 m)   Wt 239 lb 6.7 oz  (108.6 kg)   SpO2 92%   BMI 31.59 kg/m   Physical Exam Vitals and nursing note reviewed.  Constitutional:      General: He is not in acute distress.    Comments: Tracheostomy.  Pulmonary:     Effort: Pulmonary effort is normal. No respiratory distress.     Comments: Tracheostomy. Abdominal:     Comments: Gastrostomy tube site soft without tenderness, erythema, edema, drainage, or active bleeding; bumper cinched to skin.  Skin:    General: Skin is warm and dry.  Neurological:     Mental Status: He is alert.     Imaging: IR GASTROSTOMY TUBE MOD SED  Result Date: 06/23/2019 INDICATION: 34 year old male with a history of dysphagia EXAM: PERC PLACEMENT GASTROSTOMY MEDICATIONS: 2 g Ancef; Antibiotics were administered within 1 hour of the procedure. Scratch ANESTHESIA/SEDATION: Versed 1.0 mg IV; Fentanyl 25 mcg IV Moderate Sedation Time:  16 minutes The patient was continuously monitored during the procedure by the interventional radiology nurse under my direct supervision. CONTRAST:  17mL OMNIPAQUE IOHEXOL 300 MG/ML SOLN - administered into the gastric lumen. FLUOROSCOPY TIME:  Fluoroscopy Time: 7 minutes 24 seconds (150 mGy). COMPLICATIONS: None PROCEDURE: Informed written consent was obtained from the patient and the patient's family after a thorough discussion of the procedural risks, benefits and alternatives. All questions were addressed. Maximal Sterile Barrier Technique was utilized including caps, mask, sterile gowns, sterile gloves, sterile drape, hand hygiene and skin antiseptic. A timeout was performed prior to the initiation of the procedure. The epigastrium was prepped with Betadine in a sterile fashion, and a sterile drape was applied covering the operative field. A sterile gown and sterile gloves were used for the procedure. A 5-French orogastric tube is placed under fluoroscopic guidance. Scout imaging of the abdomen confirms barium within the transverse colon. The stomach was  distended with gas. Under fluoroscopic guidance, an 18 gauge needle was utilized to puncture the anterior wall of the body of the stomach. An Amplatz wire was advanced through the needle passing a T fastener into the lumen of the stomach. The T fastener was secured for gastropexy. A 9-French sheath was inserted. A snare was advanced through the 9-French sheath. A Britta Mccreedy was advanced through the orogastric tube. It was snared then pulled out the oral cavity, pulling the snare, as well. The leading edge of the gastrostomy was attached to the snare. It was then pulled down the esophagus and out the percutaneous site. Tube secured in place. Contrast was injected. Patient tolerated the procedure well and remained hemodynamically stable throughout. No complications were encountered and no significant blood loss encountered. IMPRESSION: Status post fluoroscopic placed percutaneous gastrostomy tube, with 20 [place] pull-through. Signed, Dulcy Fanny. Earleen Newport, DO [facility] Radiology Electronically Signed   By: Corrie Mckusick D.O.   On: 06/23/2019 17:14   DG Chest Port 1 View  Result Date: 06/23/2019 CLINICAL DATA:  Acute respiratory failure. EXAM:  PORTABLE CHEST 1 VIEW COMPARISON:  06/20/2019. FINDINGS: Interim removal of tracheostomy tube and placement of endotracheal tube. Endotracheal tube tip 2 cm above the lower most portion of the carina. NG tube, right IJ line in stable position. Severe cardiomegaly again noted. Diffuse bilateral pulmonary infiltrates/edema. No pleural effusion or pneumothorax. IMPRESSION: 1. Interim removal of tracheostomy tube and placement of an endotracheal tube. Endotracheal tube tip 2 cm above the lower most portion of the carina. NG tube and right IJ line stable position. 2.  Severe cardiomegaly.  Again noted. 3.  Diffuse bilateral pulmonary infiltrates/edema. Electronically Signed   By: Marcello Moores  Register   On: 06/23/2019 08:09     Labs:  CBC: Recent Labs    06/21/19 3875 06/22/19 0427 06/23/19 0828 06/24/19 0403  WBC 10.1 9.5 7.7 6.0  HGB 8.9* 8.2* 8.9* 9.1*  HCT 30.6* 28.2* 29.9* 30.9*  PLT 190 193 182 204    COAGS: Recent Labs    06/21/19 0611 06/21/19 1624 06/22/19 0427 06/22/19 0427 06/23/19 0745 06/23/19 2019 06/24/19 0403 06/24/19 0904  INR 1.2  --  1.4*  --  1.4*  --  1.4*  --   APTT  --    < > 58*   < > 48* 52* 38* 55*   < > = values in this interval not displayed.    BMP: Recent Labs    06/22/19 1712 06/23/19 0828 06/23/19 2019 06/24/19 0403  NA 140 140 138 140  139  K 3.9 3.9 4.0 3.8  3.7  CL 102 99 99 99  97*  CO2 24 27 24 25  25   GLUCOSE 139* 71 70 92  94  BUN 28* 42* 45* 47*  48*  CALCIUM 8.4* 9.0 9.2 9.4  9.4  CREATININE 2.05* 2.55* 2.77* 2.97*  3.06*  GFRNONAA 41* 31* 28* 26*  25*  GFRAA 47* 36* 33* 30*  29*    LIVER FUNCTION TESTS: Recent Labs    06/06/19 0500 06/06/19 0500 06/07/19 0411 06/07/19 0411 06/08/19 0500 06/09/19 0750 06/10/19 1157 06/17/19 0815 06/22/19 1712 06/23/19 0828 06/23/19 2019 06/24/19 0403  BILITOT 1.0  --  0.9  --  2.1*  --  1.1  --   --   --   --   --   AST 30  --  36  --  91*  --  35  --   --   --   --   --   ALT 28  --  30  --  27  --  29  --   --   --   --   --   ALKPHOS 145*  --  132*  --  156*  --  162*  --   --   --   --   --   PROT 6.5  --  6.3*  --  6.2*  --  7.0  --   --   --   --   --   ALBUMIN 2.4*   < > 2.4*   < > 2.6*   < > 2.7*   < > 2.3* 2.4* 2.4* 2.5*   < > = values in this interval not displayed.    Assessment and Plan:  History of CVA with residual dysphasia s/p percutaneous gastrostomy tube placement in IR 06/23/2019 by Dr. Earleen Newport. Gastrostomy tube stable- tube is ready for use. Please ensure that bumper is cinched to skin to prevent leakage. Further plans per CCM/TRH/neurology/cardiology/nephrology- appreciate and agree with management. Please  call IR with  questions/concerns.   Electronically Signed: Earley Abide, PA-C 06/24/2019, 11:16 AM   I spent a total of 15 Minutes at the the patient's bedside AND on the patient's hospital floor or unit, greater than 50% of which was counseling/coordinating care for dysphasia s/p gastrostomy tube placement.

## 2019-06-24 NOTE — Plan of Care (Signed)

## 2019-06-25 ENCOUNTER — Inpatient Hospital Stay (HOSPITAL_COMMUNITY): Payer: No Typology Code available for payment source

## 2019-06-25 DIAGNOSIS — J9601 Acute respiratory failure with hypoxia: Secondary | ICD-10-CM | POA: Diagnosis not present

## 2019-06-25 DIAGNOSIS — N179 Acute kidney failure, unspecified: Secondary | ICD-10-CM | POA: Diagnosis not present

## 2019-06-25 DIAGNOSIS — Z515 Encounter for palliative care: Secondary | ICD-10-CM | POA: Diagnosis not present

## 2019-06-25 DIAGNOSIS — I639 Cerebral infarction, unspecified: Secondary | ICD-10-CM | POA: Diagnosis not present

## 2019-06-25 LAB — GLUCOSE, CAPILLARY
Glucose-Capillary: 103 mg/dL — ABNORMAL HIGH (ref 70–99)
Glucose-Capillary: 124 mg/dL — ABNORMAL HIGH (ref 70–99)
Glucose-Capillary: 127 mg/dL — ABNORMAL HIGH (ref 70–99)
Glucose-Capillary: 142 mg/dL — ABNORMAL HIGH (ref 70–99)
Glucose-Capillary: 145 mg/dL — ABNORMAL HIGH (ref 70–99)
Glucose-Capillary: 148 mg/dL — ABNORMAL HIGH (ref 70–99)

## 2019-06-25 LAB — RENAL FUNCTION PANEL
Albumin: 2.2 g/dL — ABNORMAL LOW (ref 3.5–5.0)
Albumin: 2.1 g/dL — ABNORMAL LOW (ref 3.5–5.0)
Anion gap: 12 (ref 5–15)
Anion gap: 14 (ref 5–15)
BUN: 38 mg/dL — ABNORMAL HIGH (ref 6–20)
BUN: 47 mg/dL — ABNORMAL HIGH (ref 6–20)
CO2: 24 mmol/L (ref 22–32)
CO2: 22 mmol/L (ref 22–32)
Calcium: 8.9 mg/dL (ref 8.9–10.3)
Calcium: 8.8 mg/dL — ABNORMAL LOW (ref 8.9–10.3)
Chloride: 98 mmol/L (ref 98–111)
Chloride: 99 mmol/L (ref 98–111)
Creatinine, Ser: 2.44 mg/dL — ABNORMAL HIGH (ref 0.61–1.24)
Creatinine, Ser: 2.92 mg/dL — ABNORMAL HIGH (ref 0.61–1.24)
GFR calc Af Amer: 38 mL/min — ABNORMAL LOW (ref >60)
GFR calc Af Amer: 31 mL/min — ABNORMAL LOW (ref >60)
GFR calc non Af Amer: 33 mL/min — ABNORMAL LOW (ref >60)
GFR calc non Af Amer: 27 mL/min — ABNORMAL LOW (ref >60)
Glucose, Bld: 130 mg/dL — ABNORMAL HIGH (ref 70–99)
Glucose, Bld: 163 mg/dL — ABNORMAL HIGH (ref 70–99)
Phosphorus: 3 mg/dL (ref 2.5–4.6)
Phosphorus: 3.5 mg/dL (ref 2.5–4.6)
Potassium: 3.9 mmol/L (ref 3.5–5.1)
Potassium: 4.2 mmol/L (ref 3.5–5.1)
Sodium: 134 mmol/L — ABNORMAL LOW (ref 135–145)
Sodium: 135 mmol/L (ref 135–145)

## 2019-06-25 LAB — CBC
HCT: 30.1 % — ABNORMAL LOW (ref 39.0–52.0)
Hemoglobin: 9 g/dL — ABNORMAL LOW (ref 13.0–17.0)
MCH: 30.7 pg (ref 26.0–34.0)
MCHC: 29.9 g/dL — ABNORMAL LOW (ref 30.0–36.0)
MCV: 102.7 fL — ABNORMAL HIGH (ref 80.0–100.0)
Platelets: 240 10*3/uL (ref 150–400)
RBC: 2.93 MIL/uL — ABNORMAL LOW (ref 4.22–5.81)
RDW: 18.9 % — ABNORMAL HIGH (ref 11.5–15.5)
WBC: 9.2 10*3/uL (ref 4.0–10.5)
nRBC: 0 % (ref 0.0–0.2)

## 2019-06-25 LAB — APTT: aPTT: 68 seconds — ABNORMAL HIGH (ref 24–36)

## 2019-06-25 LAB — PROTIME-INR
INR: 1.5 — ABNORMAL HIGH (ref 0.8–1.2)
Prothrombin Time: 18.2 seconds — ABNORMAL HIGH (ref 11.4–15.2)

## 2019-06-25 LAB — MAGNESIUM: Magnesium: 1.6 mg/dL — ABNORMAL LOW (ref 1.7–2.4)

## 2019-06-25 MED ORDER — TRANEXAMIC ACID FOR INHALATION
500.0000 mg | Freq: Two times a day (BID) | RESPIRATORY_TRACT | Status: AC
  Administered 2019-06-25 – 2019-06-26 (×3): 500 mg via RESPIRATORY_TRACT
  Filled 2019-06-25 (×3): qty 10

## 2019-06-25 MED ORDER — TRANEXAMIC ACID FOR INHALATION: 500.0000 mg | Freq: Two times a day (BID) | RESPIRATORY_TRACT | Status: DC

## 2019-06-25 MED ORDER — CHLORHEXIDINE GLUCONATE CLOTH 2 % EX PADS
6.0000 | MEDICATED_PAD | Freq: Every day | CUTANEOUS | Status: DC
  Administered 2019-06-26 – 2019-07-09 (×15): 6 via TOPICAL

## 2019-06-25 MED ORDER — QUETIAPINE FUMARATE 50 MG PO TABS
25.0000 mg | ORAL_TABLET | Freq: Two times a day (BID) | ORAL | Status: DC
  Administered 2019-06-25 – 2019-06-28 (×6): 25 mg via ORAL
  Filled 2019-06-25 (×5): qty 1

## 2019-06-25 MED ORDER — SODIUM CHLORIDE 0.9 % IV SOLN
0.0816 mg/kg/h | INTRAVENOUS | Status: DC
  Administered 2019-06-25: 16:00:00 0.057 mg/kg/h via INTRAVENOUS
  Administered 2019-06-27: 0.068 mg/kg/h via INTRAVENOUS
  Administered 2019-06-28: 0.0816 mg/kg/h via INTRAVENOUS
  Filled 2019-06-25 (×3): qty 250

## 2019-06-25 MED ORDER — WARFARIN SODIUM 3 MG PO TABS
3.0000 mg | ORAL_TABLET | Freq: Once | ORAL | Status: AC
  Administered 2019-06-25: 3 mg via ORAL
  Filled 2019-06-25: qty 1

## 2019-06-25 MED ORDER — ACETAMINOPHEN 160 MG/5ML PO SOLN
650.0000 mg | Freq: Once | ORAL | Status: AC
  Administered 2019-06-25: 650 mg
  Filled 2019-06-25: qty 20.3

## 2019-06-25 NOTE — Progress Notes (Signed)
ANTICOAGULATION CONSULT NOTE  Pharmacy Consult:  Bivalirudin and Warfarin Indication: stroke 2/5, LV thrombus 2/8, HIT 2/15  Allergies  Allergen Reactions  . Heparin Other (See Comments)    Heparin induced thrombocytopenia. 2/15 HIT OD 1.692. 2/16 SRA positive-90.     Patient Measurements: Height: 6\' 1"  (185.4 cm) Weight: 105 kg (231 lb 7.7 oz) IBW/kg (Calculated) : 79.9   Vital Signs: Temp: 97.3 F (36.3 C) (04/10 0338) Temp Source: Axillary (04/10 0338) BP: 95/58 (04/10 0338) Pulse Rate: 55 (04/10 0338)  Labs: Recent Labs    06/22/19 1712 06/23/19 0745 06/23/19 5638 06/23/19 2019 06/24/19 0403 06/24/19 0403 06/24/19 0904 06/24/19 1504 06/25/19 0233  HGB   < >  --  8.9*  --  9.1*  --   --   --  9.0*  HCT  --   --  29.9*  --  30.9*  --   --   --  30.1*  PLT  --   --  182  --  204  --   --   --  240  APTT  --  48*  --    < > 38*   < > 55* 67* 68*  LABPROT  --  17.2*  --   --  16.8*  --   --   --  18.2*  INR  --  1.4*  --   --  1.4*  --   --   --  1.5*  CREATININE   < >  --  2.55*   < > 2.97*  3.06*  --   --  2.07* 2.44*   < > = values in this interval not displayed.    Estimated Creatinine Clearance: 53.7 mL/min (A) (by C-G formula based on SCr of 2.44 mg/dL (H)).  Assessment: 34 yr old male with history of ESRD on HD presented with left MCA infarct - received TPA and revascularization with IR on 2/5. On 2/8 found to have small LV apical thrombus on ECHO. Pharmacy consulted to dose IV heparin, which was switched to bivalirudin 2/15 for HIT. 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 SRA very positive at 64. Heparin allergy has appropriately been added to chart and updated with SRA results. Transitioned off bivalrudin on 3/14. Restarted bivalirudin 3/26 and briefly held on 3/31 for chest tube placement.   Bivalirudin was held on 4/5 due to significant bleeding from trach and patient was intended to receive PEG on 4/6; however procedure  was delayed and bivalirudin resumed targeting lower end of therapeutic range due to trach bleeding. Bivalirudin held again 4/8 AM for PEG procedure and resumed after PEG placement on 4/8 PM. Pharmacy has been consulted to resume warfarin now that PEG has been placed and continue bivalirudin until INR >3 and then recheck INR and aPTT 4-6 hours later  aPTT this morning of 68 is therapeutic on bivalirudin 0.057mg /kghr. INR 1.5. CBC stable.   Goal of Therapy:  aPTT 50-85 seconds INR 2-3 Monitor platelets by anticoagulation protocol: Yes   Plan:  Continue bivalirudin at 0.057 mg/kg/hr Warfarin 3 mg x 1 tonight Monitor bleeding from trach site  Monitor daily aPTT, CBC, INR  Vertis Kelch, PharmD, [facility] PGY2 Cardiology Pharmacy Resident Phone [phone] 06/25/2019       7:33 AM  Please check [email or web address] for unit-specific pharmacist phone numbers

## 2019-06-25 NOTE — Progress Notes (Addendum)
RR was 40's -50's with slow repeatedly ( tachy and bradypnea). HR went up to 110's -120's. SpO2 70's to 90's continuously up and down frequently less than 5-10 seconds. Paging Dr. Jacquelin Hawking regarding this matter. Dr. Jacquelin Hawking said that he was going to check him soon. Palliative care nurse came by to check patient then patient's breathing pattern wasn't like that before. RT stated that bloody sputum came out from trach when she started to give chest PT. Paging Dr. Jacquelin Hawking regarding this issue. Talked to rapid response nurse also regarding this matter. She came to check patient and she knew him when he was in ICU. Paging critical care team. Dr. Tamala Julian came to check patient's condition. Dr. Tamala Julian stated that he has been neuro breathing pattern (tachy-brady). Continue to monitor RR, until SpO2 is okay. SpO2 doesn't come back to above 90's for a while then call critical care team.  HS Truman Hayward RN

## 2019-06-25 NOTE — Progress Notes (Addendum)
[facility] NEPHROLOGY PROGRESS NOTE  Assessment/ Plan: Pt is a 34 y.o. yo male with history of CHF, myocarditis, and NICM, stroke, hospital course complicated by septic shock, HCAP and AKI requiring CRRT.  #Acute kidney injury multifactorial etiology including ATN following cardiogenic/septic shock; anuric and now dialysis dependent MWF. Discontinued CRRT on 4/4 and has been tolerating intermittent hemodialysis.  Last HD on 4/9 with around 600 cc UF, midodrine for hypotension.  Tolerating well so far.  Plan for next HD on 4/12. Overall prognosis is poor given no improvement in encephalopathy and remains vent and dialysis dependent.  Palliative care has seen the patient.  #Acute stroke: Likely embolic with bilateral infarcts and LV thrombus.  Has residual encephalopathy.  Gastric tube placed by IR on 4/8.  #Hypoxic respiratory failure, prolonged ventilator dependent, has tracheostomy.  Pulmonary following.  #Shock: Off of levo.  Blood pressure soft.  On midodrine 10 mg 3 times daily.  #Anemia: Continue ESA.  No Iron because of concern of infection.  Transfuse as needed.  # CKD-MBD; on Renvela.  Phosphorus level acceptable. PTH 70.  Addendum: Discussed with ICU team.  Chest x-ray has worsening airspace disease concerning for fluid.  I will order dialysis for today if schedule allows mainly for ultrafiltration.  If the respiratory status worsen then he may have to go to ICU and put on vent..  Subjective: Seen and examined at bedside.  Transferred to [place].  No new event. Objective Vital signs in last 24 hours: Vitals:   06/25/19 0300 06/25/19 0305 06/25/19 0338 06/25/19 0837  BP:   (!) 95/58   Pulse: 80 (!) 106 (!) 55 (!) 111  Resp:  (!) 38 (!) 43 (!) 22  Temp:   (!) 97.3 F (36.3 C)   TempSrc:   Axillary   SpO2:  100% 100% 98%  Weight:   105 kg   Height:       Weight change: -3.6 kg  Intake/Output Summary (Last 24 hours) at 06/25/2019 0900 Last data filed at  06/24/2019 1500 Gross per 24 hour  Intake 487.6 ml  Output 661 ml  Net -173.4 ml       Labs: Basic Metabolic Panel: Recent Labs  Lab 06/24/19 0403 06/24/19 1504 06/25/19 0233  NA 140  139 138 134*  K 3.8  3.7 3.6 3.9  CL 99  97* 98 98  CO2 _0 GLUCOSE 92  94 127* 130*  BUN 47*  48* 26* 38*  CREATININE 2.97*  3.06* 2.07* 2.44*  CALCIUM 9.4  9.4 8.8* 8.9  PHOS 4.3 2.4* 3.0   Liver Function Tests: Recent Labs  Lab 06/24/19 0403 06/24/19 1504 06/25/19 0233  ALBUMIN 2.5* 2.4* 2.2*   No results for input(s): LIPASE, AMYLASE in the last 168 hours. No results for input(s): AMMONIA in the last 168 hours. CBC: Recent Labs  Lab 06/21/19 0611 06/21/19 0611 06/22/19 0427 06/22/19 0427 06/23/19 0828 06/24/19 0403 06/25/19 0233  WBC 10.1   < > 9.5   < > 7.7 6.0 9.2  HGB 8.9*   < > 8.2*   < > 8.9* 9.1* 9.0*  HCT 30.6*   < > 28.2*   < > 29.9* 30.9* 30.1*  MCV 105.2*  --  106.0*  --  104.9* 103.3* 102.7*  PLT 190   < > 193   < > 182 204 240   < > = values in this interval not displayed.   Cardiac Enzymes: No results for  input(s): CKTOTAL, CKMB, CKMBINDEX, TROPONINI in the last 168 hours. CBG: Recent Labs  Lab 06/24/19 1617 06/24/19 2042 06/25/19 0015 06/25/19 0335 06/25/19 0809  GLUCAP 113* 141* 142* 127* 103*    Iron Studies: No results for input(s): IRON, TIBC, TRANSFERRIN, FERRITIN in the last 72 hours. Studies/Results: IR GASTROSTOMY TUBE MOD SED  Result Date: 06/23/2019 INDICATION: 34 year old male with a history of dysphagia EXAM: PERC PLACEMENT GASTROSTOMY MEDICATIONS: 2 g Ancef; Antibiotics were administered within 1 hour of the procedure. Scratch ANESTHESIA/SEDATION: Versed 1.0 mg IV; Fentanyl 25 mcg IV Moderate Sedation Time:  16 minutes The patient was continuously monitored during the procedure by the interventional radiology nurse under my direct supervision. CONTRAST:  43m OMNIPAQUE IOHEXOL 300 MG/ML SOLN - administered into the  gastric lumen. FLUOROSCOPY TIME:  Fluoroscopy Time: 7 minutes 24 seconds (150 mGy). COMPLICATIONS: None PROCEDURE: Informed written consent was obtained from the patient and the patient's family after a thorough discussion of the procedural risks, benefits and alternatives. All questions were addressed. Maximal Sterile Barrier Technique was utilized including caps, mask, sterile gowns, sterile gloves, sterile drape, hand hygiene and skin antiseptic. A timeout was performed prior to the initiation of the procedure. The epigastrium was prepped with Betadine in a sterile fashion, and a sterile drape was applied covering the operative field. A sterile gown and sterile gloves were used for the procedure. A 5-French orogastric tube is placed under fluoroscopic guidance. Scout imaging of the abdomen confirms barium within the transverse colon. The stomach was distended with gas. Under fluoroscopic guidance, an 18 gauge needle was utilized to puncture the anterior wall of the body of the stomach. An Amplatz wire was advanced through the needle passing a T fastener into the lumen of the stomach. The T fastener was secured for gastropexy. A 9-French sheath was inserted. A snare was advanced through the 9-French sheath. A BBritta Mccreedywas advanced through the orogastric tube. It was snared then pulled out the oral cavity, pulling the snare, as well. The leading edge of the gastrostomy was attached to the snare. It was then pulled down the esophagus and out the percutaneous site. Tube secured in place. Contrast was injected. Patient tolerated the procedure well and remained hemodynamically stable throughout. No complications were encountered and no significant blood loss encountered. IMPRESSION: Status post fluoroscopic placed percutaneous gastrostomy tube, with 20 [place]through. Signed, JDulcy Fanny WEarleen Newport DO [facility] Electronically Signed   By: JCorrie MckusickD.O.    On: 06/23/2019 17:14    Medications: Infusions: . sodium chloride    . sodium chloride    . anticoagulant sodium citrate    . anticoagulant sodium citrate    . bivalirudin (ANGIOMAX) infusion 0.5 mg/mL (Non-ACS indications) 0.057 mg/kg/hr (06/24/19 0601)  . dextrose Stopped (06/25/19 0636)  . feeding supplement (VITAL 1.5 CAL) 1,000 mL (06/24/19 0925)    Scheduled Medications: . amiodarone  200 mg Per Tube Daily  . ARIPiprazole  10 mg Per Tube Daily  . aspirin  81 mg Per Tube Daily  . atorvastatin  40 mg Per Tube q1800  . chlorhexidine  15 mL Mouth Rinse BID  . chlorhexidine gluconate (MEDLINE KIT)  15 mL Mouth Rinse BID  . Chlorhexidine Gluconate Cloth  6 each Topical Q0600  . Chlorhexidine Gluconate Cloth  6 each Topical Q0600  . darbepoetin (ARANESP) injection - DIALYSIS  150 mcg Intravenous Q Mon-HD  . feeding supplement (PRO-STAT SUGAR FREE 64)  60 mL Per Tube BID  .  FLUoxetine  20 mg Per Tube Daily  . free water  100 mL Per Tube Q4H  . insulin aspart  0-20 Units Subcutaneous Q4H  . insulin aspart  7 Units Subcutaneous Q4H  . insulin detemir  40 Units Subcutaneous BID  . mouth rinse  15 mL Mouth Rinse q12n4p  . midodrine  10 mg Per Tube Q8H  . neomycin-bacitracin-polymyxin  1 application Topical Daily  . pantoprazole sodium  40 mg Per Tube Q1200  . polyethylene glycol  17 g Per Tube Daily  . sevelamer carbonate  2.4 g Per Tube TID WC  . sodium chloride flush  10-40 mL Intracatheter Q12H  . warfarin  3 mg Oral ONCE-1600  . Warfarin - Pharmacist Dosing Inpatient   Does not apply q1600    have reviewed scheduled and prn medications.  Physical Exam: General: On trach/vent, not in distress,  Heart:RRR, s1s2 nl, no rubs Lungs: Coarse breath sound bilateral Abdomen:soft, Non-tender, gastric feeding tube present. Extremities: Trace LE edema Dialysis Access: Right IJ TDC placed by IR on 3/2.  Davey Limas Prasad Delta Pichon 06/25/2019,9:00 AM  LOS: 64 days  Pager: [phone]

## 2019-06-25 NOTE — Progress Notes (Signed)
Dr. Jacquelin Hawking ordered Seroquel for anxiety which helped, but breathing pattern is still same. Dr. Tamala Julian talked Dr. Carolin Sicks regarding his breathing and will do dialysis today. Patient had temp 100.9 at 1232. Notified Dr. Jacquelin Hawking regarding this matter, there was no tylenol order. Administrated one time order of tylenol 650mg  via peg tube. Pt's BT went down to 98.5. Gave patient's mom updated today. Stopped tube feeding at 1300 due to gastric residual high ( 340 ml). Restarted tube feeding at 1620 (residual was 70 ml). HS [employer]

## 2019-06-25 NOTE — Progress Notes (Signed)
Daily Progress Note   Patient Name: Carlos Brewer.       Date: 06/25/2019 DOB: [birth year]  Age: 34 y.o. MRN#: [id number] Attending Physician: Benito Mccreedy, MD Primary Care Physician: Patient, No Pcp Per Admit Date: 05/10/2019  Reason for Consultation/Follow-up: Establishing goals of care and Psychosocial/spiritual support  Patient slightly diaphoretic, tachycardic, breathing rapidly.  He gives me a small smile and a barely preceptable nod.  Discussed with RN patient we share concern that he requires a high level of attention and is very fragile.  Called [employer] on the phone.  Discussed current status and medications.  She asks if he can get out of bed with hoyer lift.  I said that was an excellent idea when his heart rate and breathing allow it.  We talked about the thrombus in his heart which likely created the stroke.  Now he is on coumadin and aspirin for a very high (but not full proof ) level of protection.  We discuss that the primary concern at this point is his fragility - he is at very high risk for complications such as pneumonia or recurrent heart failure.   Bodies are not meant to lie in bed and it will be important to encourage movement when his heart and lungs can tolerate it.  We discussed ways to engage his mind more and improve communication.  A vision board was suggested.  Langley Gauss talked with me about bringing in nerf basketballs to throw to him.  PMT committed to continue to check in intermittently and assist where possible.   Subjective: Clinically patient appears to be in some type of decline/distress given diaphoresis, tachycardia and rapid respiratory rate.  Assessment: Very fragile young man after debilitating stroke.  Able to interact non-verbally.  Xray  reviewed today 4/10 shows worsening of his bilateral air/space disease.  WBC still WNL and he does not have a fever.   Patient Profile/HPI:  34 y.o. male  with past medical history of OSA, NICM, myocarditis in 2018 that left him with an EF of 15 - 20%, obesity and cocaine abuse who was admitted on 04/23/2019 with a left MCA CVA.  The patient is currently trach'd and remains on vent support.  He is due to receive a PEG tomorrow.  He has progressed from CRRT to intermittent HD over the  last two days.  He still requires vasopressors and has difficulty managing his secretions.     Length of Stay: 52  Current Medications: Scheduled Meds:  . amiodarone  200 mg Per Tube Daily  . ARIPiprazole  10 mg Per Tube Daily  . aspirin  81 mg Per Tube Daily  . atorvastatin  40 mg Per Tube q1800  . chlorhexidine  15 mL Mouth Rinse BID  . chlorhexidine gluconate (MEDLINE KIT)  15 mL Mouth Rinse BID  . Chlorhexidine Gluconate Cloth  6 each Topical Q0600  . Chlorhexidine Gluconate Cloth  6 each Topical Q0600  . darbepoetin (ARANESP) injection - DIALYSIS  150 mcg Intravenous Q Mon-HD  . feeding supplement (PRO-STAT SUGAR FREE 64)  60 mL Per Tube BID  . FLUoxetine  20 mg Per Tube Daily  . free water  100 mL Per Tube Q4H  . insulin aspart  0-20 Units Subcutaneous Q4H  . insulin aspart  7 Units Subcutaneous Q4H  . insulin detemir  40 Units Subcutaneous BID  . mouth rinse  15 mL Mouth Rinse q12n4p  . midodrine  10 mg Per Tube Q8H  . neomycin-bacitracin-polymyxin  1 application Topical Daily  . pantoprazole sodium  40 mg Per Tube Q1200  . polyethylene glycol  17 g Per Tube Daily  . sevelamer carbonate  2.4 g Per Tube TID WC  . sodium chloride flush  10-40 mL Intracatheter Q12H  . warfarin  3 mg Oral ONCE-1600  . Warfarin - Pharmacist Dosing Inpatient   Does not apply q1600    Continuous Infusions: . sodium chloride    . sodium chloride    . anticoagulant sodium citrate    . anticoagulant sodium citrate    .  bivalirudin (ANGIOMAX) infusion 0.5 mg/mL (Non-ACS indications) 0.057 mg/kg/hr (06/24/19 0601)  . dextrose Stopped (06/25/19 0636)  . feeding supplement (VITAL 1.5 CAL) 1,000 mL (06/24/19 0925)    PRN Meds: sodium chloride, sodium chloride, ALPRAZolam, alteplase, anticoagulant sodium citrate, anticoagulant sodium citrate, bisacodyl, docusate, fentaNYL (SUBLIMAZE) injection, lidocaine (PF), lidocaine-prilocaine, lip balm, loperamide HCl, ondansetron (ZOFRAN) IV, pentafluoroprop-tetrafluoroeth, sodium chloride flush   Vital Signs: BP (!) 95/58 (BP Location: Left Wrist)   Pulse (!) 111   Temp (!) 97.3 F (36.3 C) (Axillary)   Resp (!) 22   Ht _0  (1.854 m)   Wt 105 kg   SpO2 98%   BMI 30.54 kg/m  SpO2: SpO2: 98 % O2 Device: O2 Device: Tracheostomy Collar O2 Flow Rate: O2 Flow Rate (L/min): 10 L/min  Intake/output summary:   Intake/Output Summary (Last 24 hours) at 06/25/2019 0954 Last data filed at 06/24/2019 1500 Gross per 24 hour  Intake 387.6 ml  Output 661 ml  Net -273.4 ml   LBM: Last BM Date: 06/23/19 Baseline Weight: Weight: 124.2 kg Most recent weight: Weight: 105 kg       Palliative Assessment/Data: 20%      Patient Active Problem List   Diagnosis Date Noted  . Hemoptysis 06/20/2019  . Palliative care encounter   . Type 2 diabetes mellitus with other specified complication ([place]) 35/46/5681  . Hypoxia   . HFrEF (heart failure with reduced ejection fraction) ([place])   . Pneumothorax   . Pneumomediastinum ([place])   . Acute hypoxemic respiratory failure ([place])   . Fever   . Complication of tracheostomy tube ([place])   . Endotracheal tube present   . Acute CVA (cerebrovascular accident) ([place])   . Acute kidney injury ([place])   . Tracheostomy  tube present ([place])   . Pressure injury of skin 05/02/2019  . Shock liver 04/23/2019  . Respiratory failure ([place]) 04/23/2019  . Occlusion of left middle cerebral artery 04/29/2019  . Middle cerebral artery embolism, left  04/19/2019  . Cerebrovascular accident (CVA) due to thrombosis of left middle cerebral artery ([place])   . Shock ([place])   . COVID-19 virus infection 12/14/2018  . COVID-19 12/13/2018  . Obesity (BMI 30-39.9)   . OSA on CPAP   . CKD (chronic kidney disease), stage II   . Polysubstance (excluding opioids) dependence ([place])   . Acute on chronic systolic CHF (congestive heart failure) ([place]) 07/12/2018  . Morbid obesity ([place]) 02/07/2017  . Nonischemic cardiomyopathy ([place]) 11/07/2016  . Chronic systolic heart failure ([place]) 11/05/2016  . Anxiety 11/05/2016  . Substance use disorder 11/05/2016  . Pneumonia 10/24/2016    Palliative Care Plan    Recommendations/Plan:  Full scope, aggressive care desired by family.  Question if he needs continued ICU level care.  Consider chest physiotherapy  Sitting up in bed when possible.  Goals of Care and Additional Recommendations:  Limitations on Scope of Treatment: Full Scope Treatment  Code Status:  Full code  Prognosis:  Very fragile patient at high risk for acute decline or death unfortunately.  Discharge Planning:  To Be Determined  Care plan was discussed with RN, mother.  Thank you for allowing the Palliative Medicine Team to assist in the care of this patient.  Total time spent:  30 min.     Greater than 50%  of this time was spent counseling and coordinating care related to the above assessment and plan.  Florentina Jenny, PA-C Palliative Medicine  Please contact Palliative MedicineTeam phone at [phone] for questions and concerns between 7 am - 7 pm.   Please see AMION for individual provider pager numbers.

## 2019-06-25 NOTE — Plan of Care (Signed)
  Problem: Clinical Measurements: Goal: Will remain free from infection Outcome: Progressing   Problem: Clinical Measurements: Goal: Ability to maintain clinical measurements within normal limits will improve Outcome: Not Progressing Goal: Respiratory complications will improve Outcome: Not Progressing   Problem: Activity: Goal: Risk for activity intolerance will decrease Outcome: Not Progressing

## 2019-06-25 NOTE — Progress Notes (Signed)
[facility]                                                                              Patient Demographics  Carlos Brewer, is a 34 y.o. male, DOB - 11/03/1985, TZG:[id number]  Admit date - 04/21/2019   Admitting Physician Greta Doom, MD  Outpatient Primary MD for the patient is Patient, No Pcp Per  Outpatient specialists:   LOS - 64  days    No chief complaint on file.      Brief summary  This 34-year-old gentleman who presented admitted for acute left MCA CVA with left ventricular thrombus.  Hospital course complicated by septic shock and healthcare acquired pneumonia.  Required endotracheal intubation mechanical ventilation and ultimately tracheostomy due to failure to wean from the vent. Has had dialysis for acute kidney injury.   Patient transferred hospital service 06/25/2019     Assessment & Plan    Active Problems:   Occlusion of left middle cerebral artery   Middle cerebral artery embolism, left   Cerebrovascular accident (CVA) due to thrombosis of left middle cerebral artery (HCC)   Shock ([place])   Shock liver   Respiratory failure (HCC)   Pressure injury of skin   Tracheostomy tube present (HCC)   Acute CVA (cerebrovascular accident) ([place])   Acute kidney injury ([place])   Endotracheal tube present   Complication of tracheostomy tube ([place])   Acute hypoxemic respiratory failure (HCC)   Fever   Type 2 diabetes mellitus with other specified complication (HCC)   Hypoxia   HFrEF (heart failure with reduced ejection fraction) (HCC)   Pneumothorax   Pneumomediastinum (HCC)   Hemoptysis   Palliative care encounter  Acute on chronic respiratory failure Pneumomediastinum/pneumothorax-resolved History of Cheyne-Stokes breathing Tracheostomy dependent, intermittent trach bleeding liberated from the ventilator History of healthcare acquired pneumonia with end-stage heart failure Routine trach care Patient volume overloaded and plan for  ultrafiltration at hemodialysis per nephrology Episode of tachypnea and PCCM reconsulted -Seroquel ordered  Left MCA CVA History of cocaine abuse Felt to be embolic Left pontine CVA noted 7/59/1638  Chronic systolic CHF, nonischemic EF 15% with polymorphic V. tach per history Apical thrombus in left ventricle Poor prognosis per heart failure team-not a candidate for advanced mechanical support Continue supportive care of aspirin, statin, amiodarone with as needed midodrine  HIT Resumed Bivalirudin Continue coumadin per pharmacy  Acute metabolic encephalopathy Multifactorial including multiple strokes Supportive care Acute kidney injury Suspected from ATN in the setting of cardiogenic shock Dialysis per nephrology  History of recurrent aspiration pneumonia/pneumonitis Currently off antibiotics Follow clinically  Type 2 diabetes mellitus  A1c 6.5%-04/23/2019 Patient on insulin-cut back on insulin and follow glycemic control  Deconditioning PT and OT eval and treat as tolerated    Code Status: Full code DVT Prophylaxis: PAS hose, [place] Family Communication:  Disposition Plan: Long-term acute care  Time Spent in minutes35 minutes  Procedures:    Consultants:   PCCM Cardiology Palliative medicine  Antimicrobials:      Medications  Scheduled Meds: . amiodarone  200 mg Per Tube Daily  . ARIPiprazole  10 mg Per Tube Daily  .  aspirin  81 mg Per Tube Daily  . atorvastatin  40 mg Per Tube q1800  . chlorhexidine  15 mL Mouth Rinse BID  . chlorhexidine gluconate (MEDLINE KIT)  15 mL Mouth Rinse BID  . Chlorhexidine Gluconate Cloth  6 each Topical Q0600  . Chlorhexidine Gluconate Cloth  6 each Topical Q0600  . darbepoetin (ARANESP) injection - DIALYSIS  150 mcg Intravenous Q Mon-HD  . feeding supplement (PRO-STAT SUGAR FREE 64)  60 mL Per Tube BID  . FLUoxetine  20 mg Per Tube Daily  . free water  100 mL Per Tube Q4H  . insulin aspart  0-20 Units Subcutaneous  Q4H  . insulin aspart  7 Units Subcutaneous Q4H  . insulin detemir  40 Units Subcutaneous BID  . mouth rinse  15 mL Mouth Rinse q12n4p  . midodrine  10 mg Per Tube Q8H  . neomycin-bacitracin-polymyxin  1 application Topical Daily  . pantoprazole sodium  40 mg Per Tube Q1200  . polyethylene glycol  17 g Per Tube Daily  . QUEtiapine  25 mg Oral BID  . sevelamer carbonate  2.4 g Per Tube TID WC  . sodium chloride flush  10-40 mL Intracatheter Q12H  . warfarin  3 mg Oral ONCE-1600  . Warfarin - Pharmacist Dosing Inpatient   Does not apply q1600   Continuous Infusions: . sodium chloride    . sodium chloride    . anticoagulant sodium citrate    . anticoagulant sodium citrate    . bivalirudin (ANGIOMAX) infusion 0.5 mg/mL (Non-ACS indications) 0.057 mg/kg/hr (06/24/19 0601)  . dextrose Stopped (06/25/19 0636)  . feeding supplement (VITAL 1.5 CAL) 1,000 mL (06/24/19 0925)   PRN Meds:.sodium chloride, sodium chloride, ALPRAZolam, alteplase, anticoagulant sodium citrate, anticoagulant sodium citrate, bisacodyl, docusate, fentaNYL (SUBLIMAZE) injection, lidocaine (PF), lidocaine-prilocaine, lip balm, loperamide HCl, ondansetron (ZOFRAN) IV, pentafluoroprop-tetrafluoroeth, sodium chloride flush   Antibiotics   Anti-infectives (From admission, onward)   Start     Dose/Rate Route Frequency Ordered Stop   06/23/19 1554  ceFAZolin (ANCEF) IVPB 2g/100 mL premix     over 30 Minutes  Continuous PRN 06/23/19 1554 06/23/19 1554   06/23/19 1544  ceFAZolin (ANCEF) 2-4 GM/100ML-% IVPB    Note to Pharmacy: Tamsen Snider   : cabinet override      06/23/19 1544 06/24/19 0359   06/18/19 1130  Ampicillin-Sulbactam (UNASYN) 3 g in sodium chloride 0.9 % 100 mL IVPB     3 g 200 mL/hr over 30 Minutes Intravenous Every 8 hours 06/18/19 1039 06/19/19 2119   06/16/19 1400  ceFEPIme (MAXIPIME) 2 g in sodium chloride 0.9 % 100 mL IVPB  Status:  Discontinued     2 g 200 mL/hr over 30 Minutes Intravenous Every 12  hours 06/16/19 1216 06/18/19 1010   06/16/19 1400  vancomycin (VANCOREADY) IVPB 1250 mg/250 mL  Status:  Discontinued     1,250 mg 166.7 mL/hr over 90 Minutes Intravenous Every 24 hours 06/16/19 1216 06/18/19 1010   06/16/19 1200  vancomycin (VANCOCIN) IVPB 1000 mg/200 mL premix  Status:  Discontinued     1,000 mg 200 mL/hr over 60 Minutes Intravenous Every T-Th-Sa (Hemodialysis) 06/15/19 0459 06/16/19 1216   06/15/19 2200  ceFEPIme (MAXIPIME) 1 g in sodium chloride 0.9 % 100 mL IVPB  Status:  Discontinued     1 g 200 mL/hr over 30 Minutes Intravenous Every 24 hours 06/15/19 0459 06/16/19 1216   06/15/19 0500  vancomycin (VANCOREADY) IVPB 2000 mg/400 mL  2,000 mg 200 mL/hr over 120 Minutes Intravenous  Once 06/15/19 0459 06/15/19 0802   06/15/19 0500  ceFEPIme (MAXIPIME) 2 g in sodium chloride 0.9 % 100 mL IVPB     2 g 200 mL/hr over 30 Minutes Intravenous  Once 06/15/19 0459 06/15/19 0941   06/04/19 2200  ceFEPIme (MAXIPIME) 1 g in sodium chloride 0.9 % 100 mL IVPB    Note to Pharmacy: Pharmacy may modify dose as appropriate for renal failure   1 g 200 mL/hr over 30 Minutes Intravenous Daily at bedtime 06/04/19 1347 06/06/19 2303   06/02/19 1200  anidulafungin (ERAXIS) 100 mg in sodium chloride 0.9 % 100 mL IVPB  Status:  Discontinued     100 mg 78 mL/hr over 100 Minutes Intravenous Every 24 hours 06/01/19 1049 06/04/19 1053   06/01/19 2200  ceFEPIme (MAXIPIME) 1 g in sodium chloride 0.9 % 100 mL IVPB  Status:  Discontinued    Note to Pharmacy: Pharmacy may modify dose as appropriate for renal failure   1 g 200 mL/hr over 30 Minutes Intravenous Daily at bedtime 06/01/19 1054 06/04/19 1053   06/01/19 1300  vancomycin (VANCOCIN) IVPB 1000 mg/200 mL premix     1,000 mg 200 mL/hr over 60 Minutes Intravenous  Once 06/01/19 1203 06/01/19 1401   06/01/19 1200  vancomycin (VANCOCIN) IVPB 1000 mg/200 mL premix  Status:  Discontinued     1,000 mg 200 mL/hr over 60 Minutes Intravenous Every  M-W-F (Hemodialysis) 06/01/19 0428 06/04/19 1053   06/01/19 1100  anidulafungin (ERAXIS) 200 mg in sodium chloride 0.9 % 200 mL IVPB     200 mg 78 mL/hr over 200 Minutes Intravenous  Once 06/01/19 1049 06/01/19 2002   06/01/19 1045  anidulafungin (ERAXIS) 100 mg in sodium chloride 0.9 % 100 mL IVPB  Status:  Discontinued     100 mg 78 mL/hr over 100 Minutes Intravenous Every 24 hours 06/01/19 1038 06/01/19 1048   06/01/19 0300  ceFEPIme (MAXIPIME) 1,000 mg in sodium chloride 0.9 % 100 mL IVPB  Status:  Discontinued    Note to Pharmacy: Pharmacy may modify dose as appropriate for renal failure   1,000 mg 200 mL/hr over 30 Minutes Intravenous Daily at bedtime 06/01/19 0250 06/01/19 1054   06/01/19 0300  vancomycin (VANCOREADY) IVPB 2000 mg/400 mL     2,000 mg 200 mL/hr over 120 Minutes Intravenous  Once 06/01/19 0252 06/01/19 0843   05/27/19 1000  cefTRIAXone (ROCEPHIN) 2 g in sodium chloride 0.9 % 100 mL IVPB     2 g 200 mL/hr over 30 Minutes Intravenous Every 24 hours 05/27/19 0946 05/31/19 0952   05/17/19 0000  ceFAZolin (ANCEF) IVPB 2g/100 mL premix     2 g 200 mL/hr over 30 Minutes Intravenous To Radiology 05/16/19 1431 05/17/19 1556   05/09/19 1200  piperacillin-tazobactam (ZOSYN) IVPB 3.375 g     3.375 g 100 mL/hr over 30 Minutes Intravenous Every 6 hours 05/09/19 1029 05/10/19 1830   05/08/19 1800  piperacillin-tazobactam (ZOSYN) IVPB 3.375 g  Status:  Discontinued     3.375 g 12.5 mL/hr over 240 Minutes Intravenous Every 6 hours 05/08/19 1318 05/09/19 1029   05/07/19 1100  vancomycin (VANCOCIN) IVPB 1000 mg/200 mL premix  Status:  Discontinued     1,000 mg 200 mL/hr over 60 Minutes Intravenous Every 24 hours 05/06/19 0949 05/09/19 1019   05/06/19 1030  piperacillin-tazobactam (ZOSYN) IVPB 3.375 g  Status:  Discontinued     3.375 g 12.5 mL/hr over 240  Minutes Intravenous Every 6 hours 05/06/19 0949 05/08/19 1318   05/06/19 1000  vancomycin (VANCOREADY) IVPB 2000 mg/400 mL      2,000 mg 200 mL/hr over 120 Minutes Intravenous  Once 05/06/19 0949 05/06/19 1240   04/24/19 0600  meropenem (MERREM) 1 g in sodium chloride 0.9 % 100 mL IVPB     1 g 200 mL/hr over 30 Minutes Intravenous Every 8 hours 04/23/19 2024 04/29/19 2138   04/23/19 2000  meropenem (MERREM) 2 g in sodium chloride 0.9 % 100 mL IVPB     2 g 200 mL/hr over 30 Minutes Intravenous Every 8 hours 04/23/19 1956 04/23/19 2057   04/23/19 1000  vancomycin (VANCOREADY) IVPB 1250 mg/250 mL  Status:  Discontinued     1,250 mg 166.7 mL/hr over 90 Minutes Intravenous Every 24 hours 04/23/19 0907 04/26/19 1003   04/23/19 1000  piperacillin-tazobactam (ZOSYN) IVPB 3.375 g  Status:  Discontinued     3.375 g 12.5 mL/hr over 240 Minutes Intravenous Every 8 hours 04/23/19 0907 04/23/19 1955   04/23/2019 0549  ceFAZolin (ANCEF) 2-4 GM/100ML-% IVPB    Note to Pharmacy: Fredric Dine   : cabinet override      04/25/2019 0549 04/29/2019 1759        Subjective:   Carlos Brewer was seen and examined today.  Episodes of tachypnea with apnea/desats.  Patient unable to volunteer. Objective:   Vitals:   06/25/19 0837 06/25/19 1000 06/25/19 1030 06/25/19 1123  BP:  110/66 116/79   Pulse: (!) 111 (!) 113 (!) 113 (!) 119  Resp: (!) 22 (!) 25 (!) 23 (!) 30  Temp:   99.6 F (37.6 C)   TempSrc:   Axillary   SpO2: 98%  92% 93%  Weight:      Height:        Intake/Output Summary (Last 24 hours) at 06/25/2019 1144 Last data filed at 06/24/2019 1500 Gross per 24 hour  Intake 310.5 ml  Output --  Net 310.5 ml     Wt Readings from Last 3 Encounters:  06/25/19 105 kg  12/14/18 127.1 kg  08/27/18 122.5 kg     Exam  General: Intermittent agitation  HEENT: NCAT,  PERRL,MMM  Neck: Trach noted, SUPPLE, (-) JVD  Cardiovascular: Mild tachycardic, (-) GALLOP  Respiratory: Coarse breath sounds anteriorly  Gastrointestinal: SOFT, (-) DISTENSION, BS(+), (_) TENDERNESS  Ext: (-) CYANOSIS, (+) trace EDEMA  Neuro:  Awake but does not follow command        Data Reviewed:  I have personally reviewed following labs and imaging studies  Micro Results No results found for this or any previous visit (from the past 240 hour(s)).  Radiology Reports DG Chest 1 View  Result Date: 06/13/2019 CLINICAL DATA:  Tracheomegaly. Congestive heart failure. EXAM: CHEST  1 VIEW COMPARISON:  Radiographs 06/11/2019 and 06/08/2019. CT 12/13/2018. FINDINGS: 1652 hours. Right IJ dialysis catheter and tracheostomy appear unchanged. The feeding tube has been advanced, projecting below the diaphragm, tip not visualized. There is stable cardiac enlargement. Diffuse bilateral airspace opacities have not significantly changed. There is no pneumothorax or significant pleural effusion. The bones appear unchanged. IMPRESSION: Interval advancement of the feeding tube. Otherwise stable chest with diffuse bilateral airspace opacities consistent with congestive heart failure. Electronically Signed   By: Richardean Sale M.D.   On: 06/13/2019 16:59   DG Abd 1 View  Result Date: 06/11/2019 CLINICAL DATA:  Enteric catheter placement EXAM: ABDOMEN - 1 VIEW COMPARISON:  06/02/2019 FINDINGS: Supine frontal view  of the lower chest and upper abdomen was performed. No enteric catheter is identified. Bowel gas pattern is unremarkable. Lung bases are clear. IMPRESSION: 1. There is no enteric catheter identified on this exam. Chest x-ray may be useful to assess enteric catheter position. Electronically Signed   By: Randa Ngo M.D.   On: 06/11/2019 20:47   CT HEAD WO CONTRAST  Result Date: 06/15/2019 CLINICAL DATA:  Encephalopathy previous infarction EXAM: CT HEAD WITHOUT CONTRAST TECHNIQUE: Contiguous axial images were obtained from the base of the skull through the vertex without intravenous contrast. COMPARISON:  CT brain, 06/01/2019, MR brain, 04/28/2019 FINDINGS: Brain: Interval evolution of subacute to chronic bilateral frontal lobe infarctions  primarily involving the operculum (series 3, image 21) as well as a large infarction of the right cerebellar hemisphere (series 3, image 11), seen acutely on prior MR dated 04/28/2019. There has also been interval evolution of infarction of the central and left pons (series 3, image 12). No new infarction or acute intracranial finding appreciated. Vascular: No hyperdense vessel or unexpected calcification. Skull: Normal. Negative for fracture or focal lesion. Sinuses/Orbits: No acute finding. Other: None. IMPRESSION: Expected interval evolution of subacute to chronic infarctions of the frontal lobes, right cerebellar hemisphere, and pons. No noncontrast CT evidence of new infarction or other acute intracranial pathology. Electronically Signed   By: Eddie Candle M.D.   On: 06/15/2019 11:38   CT HEAD WO CONTRAST  Result Date: 06/01/2019 CLINICAL DATA:  Cerebral hemorrhage suspected EXAM: CT HEAD WITHOUT CONTRAST TECHNIQUE: Contiguous axial images were obtained from the base of the skull through the vertex without intravenous contrast. COMPARISON:  MRI MRA 04/28/2019, CT 04/23/2019 FINDINGS: Brain: New area of hypoattenuation in the left pons. Increasing evolving area of encephalomalacia in the left frontal lobe. Possible expansion expansion of a region of encephalomalacia involving the right frontal lobe and insula when compared to prior. More punctate region of gliotic change at the frontoparietal vertex on the right. Seen on comparison MRI. Further areas of remote infarct within the right cerebellum. No evidence of acute intracranial hemorrhage. No mass effect or midline shift. Basal cisterns are patent. Midline intracranial structures are unremarkable. Cerebellar tonsils are normally positioned. Vascular: No hyperdense vessel or unexpected calcification. Skull: No calvarial fracture or suspicious osseous lesion. No scalp swelling or hematoma. Sinuses/Orbits: Paranasal sinuses and mastoid air cells are  predominantly clear. Slightly dysconjugate gaze. Included orbital structures are otherwise unremarkable. Other: None IMPRESSION: New area of hypoattenuation in the left pons concerning for subacute to early chronic infarct not seen on comparison imaging. Interval expansion of a region of gliosis and encephalomalacia in the right frontal lobe beyond the territory seen on comparison MR and MRA. Recommend further evaluation with MRI for better assessment. Likely evolving region of encephalomalacia in the left frontal lobe corresponding to prior infarct on MRI. With additional areas corresponding to remote infarct in the right frontoparietal vertex and right cerebellar hemisphere. No evidence of acute intracranial hemorrhage. Electronically Signed   By: Lovena Le M.D.   On: 06/01/2019 04:37   CT CHEST WO CONTRAST  Result Date: 06/15/2019 CLINICAL DATA:  Pneumothorax, pneumomediastinum, abnormal chest radiograph EXAM: CT CHEST WITHOUT CONTRAST TECHNIQUE: Multidetector CT imaging of the chest was performed following the standard protocol without IV contrast. COMPARISON:  Chest radiograph, 06/15/2019, 10:23 a.m. FINDINGS: Cardiovascular: Large-bore right neck multi lumen vascular catheter, tip near the superior cavoatrial junction. Cardiomegaly. No pericardial effusion. Mediastinum/Nodes: No enlarged mediastinal, hilar, or axillary lymph nodes. Extensive pneumomediastinum and subcutaneous  emphysema in the lower neck. Thyroid gland, trachea, and esophagus demonstrate no significant findings. Lungs/Pleura: Tracheostomy. Extensive heterogeneous and consolidative airspace disease, worse in the right lung. Minimal, less than 5% bilateral pneumothoraces (series 3, image 79). Upper Abdomen: No acute abnormality. Musculoskeletal: No chest wall mass or suspicious bone lesions identified. IMPRESSION: 1. Extensive pneumomediastinum and subcutaneous emphysema in the lower neck. 2.  Minimal, less than 5% bilateral  pneumothoraces. 3. Extensive heterogeneous and consolidative airspace disease, worse in the right lung, consistent with multifocal infection, edema, and/or ARDS. 4.  Tracheostomy. 5.  Cardiomegaly. Electronically Signed   By: Eddie Candle M.D.   On: 06/15/2019 11:43   IR GASTROSTOMY TUBE MOD SED  Result Date: 06/23/2019 INDICATION: 34 year old male with a history of dysphagia EXAM: PERC PLACEMENT GASTROSTOMY MEDICATIONS: 2 g Ancef; Antibiotics were administered within 1 hour of the procedure. Scratch ANESTHESIA/SEDATION: Versed 1.0 mg IV; Fentanyl 25 mcg IV Moderate Sedation Time:  16 minutes The patient was continuously monitored during the procedure by the interventional radiology nurse under my direct supervision. CONTRAST:  59m OMNIPAQUE IOHEXOL 300 MG/ML SOLN - administered into the gastric lumen. FLUOROSCOPY TIME:  Fluoroscopy Time: 7 minutes 24 seconds (150 mGy). COMPLICATIONS: None PROCEDURE: Informed written consent was obtained from the patient and the patient's family after a thorough discussion of the procedural risks, benefits and alternatives. All questions were addressed. Maximal Sterile Barrier Technique was utilized including caps, mask, sterile gowns, sterile gloves, sterile drape, hand hygiene and skin antiseptic. A timeout was performed prior to the initiation of the procedure. The epigastrium was prepped with Betadine in a sterile fashion, and a sterile drape was applied covering the operative field. A sterile gown and sterile gloves were used for the procedure. A 5-French orogastric tube is placed under fluoroscopic guidance. Scout imaging of the abdomen confirms barium within the transverse colon. The stomach was distended with gas. Under fluoroscopic guidance, an 18 gauge needle was utilized to puncture the anterior wall of the body of the stomach. An Amplatz wire was advanced through the needle passing a T fastener into the lumen of the stomach. The T fastener was secured for gastropexy.  A 9-French sheath was inserted. A snare was advanced through the 9-French sheath. A BBritta Mccreedywas advanced through the orogastric tube. It was snared then pulled out the oral cavity, pulling the snare, as well. The leading edge of the gastrostomy was attached to the snare. It was then pulled down the esophagus and out the percutaneous site. Tube secured in place. Contrast was injected. Patient tolerated the procedure well and remained hemodynamically stable throughout. No complications were encountered and no significant blood loss encountered. IMPRESSION: Status post fluoroscopic placed percutaneous gastrostomy tube, with 20 FP[place]-through. Signed, JDulcy Fanny WEarleen Newport DO [facility] Electronically Signed   By: JCorrie MckusickD.O.   On: 06/23/2019 17:14   DG Chest Port 1 View  Result Date: 06/25/2019 CLINICAL DATA:  Acute respiratory failure EXAM: PORTABLE CHEST 1 VIEW COMPARISON:  06/23/2019 FINDINGS: Tracheostomy tube and central venous line unchanged. Stable cardiac silhouette. There is diffuse airspace disease slight increased in density compared to prior. IMPRESSION: 1. Slight worsening of diffuse bilateral airspace disease. 2. Stable support apparatus. Electronically Signed   By: SSuzy BouchardM.D.   On: 06/25/2019 09:45   DG Chest Port 1 View  Result Date: 06/23/2019 CLINICAL DATA:  Acute respiratory failure. EXAM: PORTABLE CHEST 1 VIEW COMPARISON:  06/20/2019. FINDINGS: Interim removal of tracheostomy tube and placement  of endotracheal tube. Endotracheal tube tip 2 cm above the lower most portion of the carina. NG tube, right IJ line in stable position. Severe cardiomegaly again noted. Diffuse bilateral pulmonary infiltrates/edema. No pleural effusion or pneumothorax. IMPRESSION: 1. Interim removal of tracheostomy tube and placement of an endotracheal tube. Endotracheal tube tip 2 cm above the lower most portion of the carina. NG tube and  right IJ line stable position. 2.  Severe cardiomegaly.  Again noted. 3.  Diffuse bilateral pulmonary infiltrates/edema. Electronically Signed   By: Marcello Moores  Register   On: 06/23/2019 08:09   DG Chest Port 1 View  Result Date: 06/20/2019 CLINICAL DATA:  Respiratory failure, hypoxia EXAM: PORTABLE CHEST 1 VIEW COMPARISON:  06/17/2019 FINDINGS: Interval removal of enteric feeding tube and placement of esophagogastric tube, tip and side port below the diaphragm. Near complete interval resolution of previously noted pneumomediastinum. Tracheostomy. Unchanged cardiomegaly. Diffuse interstitial and heterogeneous airspace opacity, not significantly changed. IMPRESSION: 1. Interval removal of enteric feeding tube and placement of esophagogastric tube, tip and side port below the diaphragm. 2. Near complete interval resolution of previously noted pneumomediastinum. 3. Unchanged cardiomegaly and diffuse interstitial heterogeneous airspace opacity, consistent with edema, infection, and/or ARDS. No new airspace opacity. Electronically Signed   By: Eddie Candle M.D.   On: 06/20/2019 11:19   DG Chest Port 1 View  Result Date: 06/17/2019 CLINICAL DATA:  34 year old male with history of respiratory failure. EXAM: PORTABLE CHEST 1 VIEW COMPARISON:  Chest x-ray 06/16/2019. FINDINGS: A tracheostomy tube is in place with tip 4.0 cm above the carina. There is a right-sided internal jugular central venous catheter with tip terminating in the distal superior vena cava. Lung volumes are slightly low. Bibasilar opacities (left greater than right), which may reflect areas of atelectasis and/or consolidation. No definite pleural effusions. Moderate cardiomegaly. Lucencies adjacent to the heart and mediastinal contours, compatible with residual pneumomediastinum. No evidence of pulmonary edema. Small amount of gas in the cervical soft tissues. IMPRESSION: 1. Support apparatus, as above. 2. Persistent bibasilar areas of atelectasis and/or  consolidation. 3. Moderate cardiomegaly. 4. Persistent pneumomediastinum. Electronically Signed   By: Vinnie Langton M.D.   On: 06/17/2019 06:40   DG Chest Port 1 View  Result Date: 06/16/2019 CLINICAL DATA:  Respiratory failure. EXAM: PORTABLE CHEST 1 VIEW COMPARISON:  CT 06/15/2019.  Chest x-ray 06/15/2019. FINDINGS: Tracheostomy tube, feeding tube, right central line in unchanged position. Pneumomediastinum again noted. Persistent very tiny biapical pneumothoraces cannot be completely excluded. Persistent right side greater than left bilateral pulmonary infiltrates/edema again noted. Interim improvement of aeration in the right lung from prior exam. No prominent pleural effusion. Stable cardiomegaly. Subcutaneous emphysema about the neck and supraclavicular regions again noted. IMPRESSION: 1. Tracheostomy tube, feeding tube, right central line in unchanged position. 2. Pneumomediastinum again noted. Persistent very tiny biapical pneumothoraces cannot be completely excluded. Subcutaneous emphysema about the neck and supraclavicular regions again noted. 3. Persistent right side greater than left bilateral pulmonary infiltrates/edema again noted. Interim improvement of aeration right lung from prior exam. 4.  Stable cardiomegaly. Electronically Signed   By: Marcello Moores  Register   On: 06/16/2019 06:27   DG Chest Port 1 View  Addendum Date: 06/15/2019   ADDENDUM REPORT: 06/15/2019 11:07 ADDENDUM: These results were called by telephone at the time of interpretation on 06/15/2019 at 11:07 am to provider Gwyndolyn Saxon MINOR , who verbally acknowledged these results. Specifically the possibility that the right pneumothorax in particular may be larger than expected based on appearance and perhaps associated  with more pronounced anterior component was discussed. CT may be helpful for further assessment. Electronically Signed   By: Zetta Bills M.D.   On: 06/15/2019 11:07   Result Date: 06/15/2019 CLINICAL DATA:   Tracheostomy tube change. EXAM: PORTABLE CHEST 1 VIEW COMPARISON:  06/14/2019 and 06/15/2019 FINDINGS: Right IJ dialysis catheter remains in place tip at the caval to atrial junction. Tracheostomy tube in place terminating, tip over mid trachea. Heart size remains enlarged with globular appearance. Diffuse airspace disease in the right chest as before with some medial lucency along the right heart border. Patchy airspace disease in the left chest similar to previous study. Discrete pneumothorax in the left chest is not visible on the current study, seen on the more recent prior. There is some lucency along the left heart border and at the left costodiaphragmatic sulcus. Subcutaneous emphysema in the right neck similar to prior study. No acute bone finding. IMPRESSION: 1. Tracheostomy tube in place. 2. Persistent cardiomegaly with globular appearance of the heart. 3. Persistent diffuse airspace disease in the right chest greater than left. 4. Lucency along the left heart border and at the left costodiaphragmatic sulcus. This likely reflects pneumomediastinum that was seen on previous imaging. The pneumothorax with anterior component is also considered as the discrete pleural line seen at the lung apices are not seen on the current exam. Film may underestimate the size of pneumothoraces particularly in right chest though there is no significant change from the recent comparison study. 5. A call is out to the referring provider to further discuss findings in the above case. Electronically Signed: By: Zetta Bills M.D. On: 06/15/2019 10:50   DG Chest Port 1 View  Result Date: 06/15/2019 CLINICAL DATA:  Code stroke. Hypoxia. EXAM: PORTABLE CHEST 1 VIEW COMPARISON:  06/14/2019 FINDINGS: Feeding tube tip is below the GE junction. Tracheostomy tube tip above carina. Right chest wall dialysis catheter is identified with tips at the cavoatrial junction. Stable cardiac enlargement. Pneumo mediastinum appears stable to  decreased in the interval. Persistent small biapical pneumothoraces. No change in diffuse bilateral lung opacities IMPRESSION: 1. Stable to improved pneumomediastinum. Persistence of small biapical pneumothoraces. 2. No change in aeration to lungs compared with previous exam. Electronically Signed   By: Kerby Moors M.D.   On: 06/15/2019 08:50   DG CHEST PORT 1 VIEW  Result Date: 06/14/2019 CLINICAL DATA:  Congestive heart failure, myocarditis EXAM: PORTABLE CHEST 1 VIEW COMPARISON:  06/13/2019 FINDINGS: Single frontal view of the chest demonstrates stable right internal jugular catheter, tracheostomy tube, and enteric catheter. The cardiac silhouette remains enlarged. Since the prior exam, trace biapical pneumothoraces have developed volume estimated approximately 5-10% each. Additionally, there is diffuse pneumomediastinum with subcutaneous gas dissecting into the supraclavicular regions. No effusion.  Persistent bilateral airspace disease. IMPRESSION: 1. Interval development of small biapical pneumothoraces and diffuse pneumomediastinum, possibly related to barotrauma. No tension effect. 2. Widespread bilateral airspace disease unchanged. 3. Continued enlarged cardiac silhouette. These results were called by telephone at the time of interpretation on 06/14/2019 at 7:53 pm to patient's nurse, Chester Holstein, who verbally acknowledged these results. Electronically Signed   By: Randa Ngo M.D.   On: 06/14/2019 19:53   DG CHEST PORT 1 VIEW  Result Date: 06/11/2019 CLINICAL DATA:  Feeding tube placement EXAM: PORTABLE CHEST 1 VIEW COMPARISON:  June 08, 2019 FINDINGS: The tracheostomy tube terminates above the carina. The tunneled dialysis catheter is stable in positioning. There is an enteric feeding tube, the tip of which projects over  the upper mediastinum. The heart size is enlarged. Diffuse hazy bilateral airspace opacities are noted. There is no pneumothorax. There are probable pleural effusions. IMPRESSION:  1. Feeding tube projects over the upper mediastinum. Repositioning is recommended. 2. Remaining lines and tubes as above. 3. Cardiomegaly with persistent diffuse hazy bilateral airspace opacities as before. These results will be called to the ordering clinician or representative by the Radiologist Assistant, and communication documented in the PACS or [employer]. Electronically Signed   By: Constance Holster M.D.   On: 06/11/2019 23:42   DG Chest Port 1 View  Result Date: 06/08/2019 CLINICAL DATA:  Tachypnea EXAM: PORTABLE CHEST 1 VIEW COMPARISON:  June 01, 2019 FINDINGS: The heart size and mediastinal contours are unchanged with cardiomegaly. There is new multifocal patchy/fluffy airspace opacities, prominently within the right upper lobe and throughout the left lung. No definite pleural effusion. ET tube is 1.5 cm above the carina. NG tube is seen coursing below the diaphragm. A right-sided central venous catheter with the tip at the superior cavoatrial junction. IMPRESSION: Interval development of multifocal patchy/fluffy airspace opacities, left greater than right. This could be due to asymmetric edema and/or multifocal pneumonia. Electronically Signed   By: Prudencio Pair M.D.   On: 06/08/2019 05:46   DG CHEST PORT 1 VIEW  Result Date: 06/01/2019 CLINICAL DATA:  Hypoxia. EXAM: PORTABLE CHEST 1 VIEW COMPARISON:  May 26, 2019. FINDINGS: Stable cardiomegaly. Tracheostomy tube is in grossly good position. Feeding tube is seen entering stomach. Right internal jugular dialysis catheter is unchanged in position. No pneumothorax or pleural effusion is noted. No acute pulmonary disease is noted. Bony thorax is unremarkable. IMPRESSION: No acute cardiopulmonary abnormality seen. Electronically Signed   By: Marijo Conception M.D.   On: 06/01/2019 11:11   DG Abd Portable 1V  Result Date: 06/18/2019 CLINICAL DATA:  Nasogastric tube placement EXAM: PORTABLE ABDOMEN - 1 VIEW COMPARISON:  June 11, 2019  FINDINGS: Nasogastric tube tip and side port are in the stomach. No bowel dilatation or air-fluid level to suggest bowel obstruction. No free air. IMPRESSION: Nasogastric tube tip and side port in stomach. No bowel obstruction or free air demonstrable on supine examination. Electronically Signed   By: Lowella Grip III M.D.   On: 06/18/2019 11:29   DG Abd Portable 1V  Result Date: 06/02/2019 CLINICAL DATA:  OG tube placement EXAM: PORTABLE ABDOMEN - 1 VIEW COMPARISON:  05/25/2019 FINDINGS: OG tube is in the stomach. Mild gaseous distention of the stomach. No evidence of bowel obstruction, organomegaly or free air. IMPRESSION: OG tube tip in the stomach. Electronically Signed   By: Rolm Baptise M.D.   On: 06/02/2019 08:54   VAS [place] LOWER EXTREMITY VENOUS (DVT)  Result Date: 05/31/2019  Lower Venous DVTStudy Indications: Fever, prolonged bed confinement.  Risk Factors: Immobility. Limitations: Body habitus, poor ultrasound/tissue interface and uncooperative. Comparison Study: No prior exam. Performing Technologist: Baldwin Crown ARDMS, RVT  Examination Guidelines: A complete evaluation includes B-mode imaging, spectral Doppler, color Doppler, and power Doppler as needed of all accessible portions of each vessel. Bilateral testing is considered an integral part of a complete examination. Limited examinations for reoccurring indications may be performed as noted. The reflux portion of the exam is performed with the patient in reverse Trendelenburg.  +---------+---------------+---------+-----------+----------+-----------------+ RIGHT    CompressibilityPhasicitySpontaneityPropertiesThrombus Aging    +---------+---------------+---------+-----------+----------+-----------------+ CFV      Full           Yes      Yes                                    +---------+---------------+---------+-----------+----------+-----------------+  SFJ      Full                                                            +---------+---------------+---------+-----------+----------+-----------------+ FV Prox  Full                                                           +---------+---------------+---------+-----------+----------+-----------------+ FV Mid   Full                                                           +---------+---------------+---------+-----------+----------+-----------------+ FV DistalFull                                                           +---------+---------------+---------+-----------+----------+-----------------+ PFV      Full                                                           +---------+---------------+---------+-----------+----------+-----------------+ POP      Full           Yes      Yes                                    +---------+---------------+---------+-----------+----------+-----------------+ PTV      Full                                         poorly visualized +---------+---------------+---------+-----------+----------+-----------------+ PERO     Full                                         poorly visualized +---------+---------------+---------+-----------+----------+-----------------+   +---------+---------------+---------+-----------+----------+-----------------+ LEFT     CompressibilityPhasicitySpontaneityPropertiesThrombus Aging    +---------+---------------+---------+-----------+----------+-----------------+ CFV      Full           Yes      Yes                                    +---------+---------------+---------+-----------+----------+-----------------+ SFJ      Full                                                           +---------+---------------+---------+-----------+----------+-----------------+  FV Prox  Full                                                           +---------+---------------+---------+-----------+----------+-----------------+ FV Mid   Full                                                            +---------+---------------+---------+-----------+----------+-----------------+ FV DistalFull                                                           +---------+---------------+---------+-----------+----------+-----------------+ PFV      Full                                                           +---------+---------------+---------+-----------+----------+-----------------+ POP      Full           Yes      Yes                                    +---------+---------------+---------+-----------+----------+-----------------+ PTV      Full                                         poorly visualized +---------+---------------+---------+-----------+----------+-----------------+ PERO     Full                                         poorly visualized +---------+---------------+---------+-----------+----------+-----------------+     Summary: RIGHT: - There is no evidence of deep vein thrombosis in the lower extremity. However, portions of this examination were limited- see technologist comments above.  - No cystic structure found in the popliteal fossa.  LEFT: - There is no evidence of deep vein thrombosis in the lower extremity. However, portions of this examination were limited- see technologist comments above.  - No cystic structure found in the popliteal fossa.  *See table(s) above for measurements and observations. Electronically signed by Harold Barban MD on 05/31/2019 at 9:41:23 PM.    Final    VAS [place] UPPER EXTREMITY VENOUS DUPLEX  Result Date: 05/31/2019 UPPER VENOUS STUDY  Indications: fever Limitations: Body habitus, bandages and patient cooperation. Performing Technologist: Antonieta Pert RDMS, RVT  Examination Guidelines: A complete evaluation includes B-mode imaging, spectral Doppler, color Doppler, and power Doppler as needed of all accessible portions of each vessel. Bilateral testing is considered an integral part of a complete  examination. Limited examinations for reoccurring indications may be performed as noted.  Right Findings: +----------+------------+---------+-----------+----------+--------------+ RIGHT  CompressiblePhasicitySpontaneousProperties   Summary     +----------+------------+---------+-----------+----------+--------------+ IJV                                                 Not visualized +----------+------------+---------+-----------+----------+--------------+ Subclavian    Full       Yes       Yes                             +----------+------------+---------+-----------+----------+--------------+ Axillary      Full       Yes       Yes                             +----------+------------+---------+-----------+----------+--------------+ Brachial      Full                                                 +----------+------------+---------+-----------+----------+--------------+ Radial        Full                                                 +----------+------------+---------+-----------+----------+--------------+ Ulnar                                               Not visualized +----------+------------+---------+-----------+----------+--------------+ Cephalic    Partial                                                +----------+------------+---------+-----------+----------+--------------+ Basilic                                             Not visualized +----------+------------+---------+-----------+----------+--------------+  Left Findings: +----------+------------+---------+-----------+----------+--------------+ LEFT      CompressiblePhasicitySpontaneousProperties   Summary     +----------+------------+---------+-----------+----------+--------------+ IJV                                                 Not visualized +----------+------------+---------+-----------+----------+--------------+ Subclavian    Full       Yes       Yes                              +----------+------------+---------+-----------+----------+--------------+ Axillary      Full       Yes       Yes                             +----------+------------+---------+-----------+----------+--------------+ Brachial  Full                                    duplicated   +----------+------------+---------+-----------+----------+--------------+ Radial        Full                                                 +----------+------------+---------+-----------+----------+--------------+ Ulnar                                               Not visualized +----------+------------+---------+-----------+----------+--------------+ Cephalic                                            Not visualized +----------+------------+---------+-----------+----------+--------------+ Basilic                                             Not visualized +----------+------------+---------+-----------+----------+--------------+  Summary:  Right: No evidence of deep vein thrombosis in the upper extremity. IJV not visualized due to trach colar, basilic and ulnar not visualized due to patient position and cooperation.  Left: No evidence of deep vein thrombosis in the upper extremity. IJV not visualized due to trach colar. Basilic and ulnar not visualized due to patient position and cooperation. Cephalic not clearly visualized. Small focal area of thrombus near punctore site. Unable to determine if it is cephalic or accessory vein.  *See table(s) above for measurements and observations.  Diagnosing physician: Harold Barban MD Electronically signed by Harold Barban MD on 05/31/2019 at 9:41:36 PM.    Final    [place] EKG SITE RITE  Result Date: 06/06/2019 If Site Rite image not attached, placement could not be confirmed due to current cardiac rhythm.   Lab Data:  CBC: Recent Labs  Lab 06/21/19 0611 06/22/19 0427 06/23/19 0828 06/24/19 0403 06/25/19 0233  WBC 10.1 9.5  7.7 6.0 9.2  HGB 8.9* 8.2* 8.9* 9.1* 9.0*  HCT 30.6* 28.2* 29.9* 30.9* 30.1*  MCV 105.2* 106.0* 104.9* 103.3* 102.7*  PLT 190 193 182 204 419   Basic Metabolic Panel: Recent Labs  Lab 06/21/19 0611 06/21/19 1624 06/22/19 0427 06/22/19 1712 06/23/19 0828 06/23/19 2019 06/24/19 0403 06/24/19 1504 06/25/19 0233  NA 135   < > 135   < > 140 138 140  139 138 134*  K 3.9   < > 3.9   < > 3.9 4.0 3.8  3.7 3.6 3.9  CL 97*   < > 100   < > 99 99 99  97* 98 98  CO2 23   < > 22   < > 27 24 25  25 25 24   GLUCOSE 121*   < > 162*   < > 71 70 92  94 127* 130*  BUN 34*   < > 55*   < > 42* 45* 47*  48* 26* 38*  CREATININE 2.82*   < > 3.59*   < > 2.55* 2.77* 2.97*  3.06* 2.07* 2.44*  CALCIUM 9.2   < > 9.0   < > 9.0 9.2 9.4  9.4 8.8* 8.9  MG 2.3  --  2.3  --  2.0  --  1.7  --  1.6*  PHOS 2.9   < > 3.5   < > 3.0 4.1 4.3 2.4* 3.0   < > = values in this interval not displayed.   GFR: Estimated Creatinine Clearance: 53.7 mL/min (A) (by C-G formula based on SCr of 2.44 mg/dL (H)). Liver Function Tests: Recent Labs  Lab 06/23/19 0828 06/23/19 2019 06/24/19 0403 06/24/19 1504 06/25/19 0233  ALBUMIN 2.4* 2.4* 2.5* 2.4* 2.2*   No results for input(s): LIPASE, AMYLASE in the last 168 hours. No results for input(s): AMMONIA in the last 168 hours. Coagulation Profile: Recent Labs  Lab 06/21/19 0611 06/22/19 0427 06/23/19 0745 06/24/19 0403 06/25/19 0233  INR 1.2 1.4* 1.4* 1.4* 1.5*   Cardiac Enzymes: No results for input(s): CKTOTAL, CKMB, CKMBINDEX, TROPONINI in the last 168 hours. BNP (last 3 results) No results for input(s): PROBNP in the last 8760 hours. HbA1C: No results for input(s): HGBA1C in the last 72 hours. CBG: Recent Labs  Lab 06/24/19 1617 06/24/19 2042 06/25/19 0015 06/25/19 0335 06/25/19 0809  GLUCAP 113* 141* 142* 127* 103*   Lipid Profile: No results for input(s): CHOL, HDL, LDLCALC, TRIG, CHOLHDL, LDLDIRECT in the last 72 hours. Thyroid Function  Tests: No results for input(s): TSH, T4TOTAL, FREET4, T3FREE, THYROIDAB in the last 72 hours. Anemia Panel: No results for input(s): VITAMINB12, FOLATE, FERRITIN, TIBC, IRON, RETICCTPCT in the last 72 hours. Urine analysis:    Component Value Date/Time   COLORURINE AMBER (A) 04/23/2019 0930   APPEARANCEUR CLOUDY (A) 04/23/2019 0930   LABSPEC 1.025 04/23/2019 0930   PHURINE 6.0 04/23/2019 0930   GLUCOSEU 50 (A) 04/23/2019 0930   HGBUR LARGE (A) 04/23/2019 0930   BILIRUBINUR NEGATIVE 04/23/2019 0930   KETONESUR NEGATIVE 04/23/2019 0930   PROTEINUR >=300 (A) 04/23/2019 0930   NITRITE NEGATIVE 04/23/2019 0930   LEUKOCYTESUR NEGATIVE 04/23/2019 0930     Benito Mccreedy M.D. [facility] 06/25/2019, 11:44 AM  Pager: [phone] Between 7am to 7pm - call Pager - [phone]  After 7pm go to [email or web address] - password [email or web address]  Call night coverage person covering after 7pm

## 2019-06-25 NOTE — Progress Notes (Signed)
Notified MD with update of increased respirations of 60/min. No orders received. RT previously came and assessed patient. Primary RN will continue to monitor.

## 2019-06-25 NOTE — Progress Notes (Signed)
NAME:  Carlos Brewer., MRN:  [id number], DOB:  10-13-1985, LOS: 59 ADMISSION DATE:  04/28/2019, CONSULTATION DATE:  05/11/2019 REFERRING MD:  Dr. Leonel Ramsay, CHIEF COMPLAINT:  Slurred speech  Brief History   34 yo male smoker found to have slurred speech and Rt sided weakness.  Admitted with left MCA CVA and multiple smaller embolic infarcts>>>  tPA and thrombectomy in IR.  Additionally found to have left ventricular  thrombus.    Course complicated by septic shock r/t HCAP, AKI requiring CRRT, polymorphic VT and cardiogenic shock.  Intermittent pressor dependence, required tracheostomy Intermittent hemodialysis [place] CVA Heart failure with reduced ejection fraction < 15% on last echocardiogram  Past Medical History  Systolic CHF with non ischemic CM, Cocaine abuse, OSA, DM Hx of CHF (EF 15%)  Hx myocarditis Smoker 1/2 ppd   Significant Hospital Events   2/05 Admit, tPA, IR thrombectomy 2/6 Shock requiring increasing levophed and phenylephrine.   2/6: switched to levophed, epi, started antibiotics, started on CRRT.  2/9  developed wide-complex tachycardia and hypotension, started on amiodarone drip, increase Levophed drip 2/15 drop in platelets, heparin stopped, bival started 2/17 Hypotensive and back on Levophed 2/18 trach, lines changed 2/19 started milrinone , fever 103 2/20 Episode of wide-complex tachycardia yesterday, changed from Levophed to vasopressin 2/22 stopping vanc. Still on inotrope support. Some AF w/ RVR. Had to be placed back on pressors. ivabradine added 2/23 still on pressors/ CRRT continued 2/24 tmax 98.4, Remains on CRRT, even UF, remains anuric, Levophed at 14 mcg/min, Milrinone at 0.125 mcg/kg/min, Coox 63.4 Doing well this morning on ATC, no events overnight.  Midodrine added   2/25 more interactive, remains on ATC, tmax 99.5/ WBC 21.4, ~900 ml liquid stool/ 24 hours, neg Cdiff, Levophed up to 20 mcg/min, CVP 2, ,milrinone remains at 0.125  mcg/kg/min, coox 92.7, Off CRRT since last night ~2000 s/p clotted x 3 off citrate, restarted  3/1 Amio drip restarted for WCT/atrial flutter, milrinone turned off 3/2 CRRT stopped, permacath placed 3/5 crrt resumed 3/6 started on pressors for crrt tolerance 3/8 No longer on CRRT or pressor support  3/12 Tolerated PS for >2 hours  3/17  febrile 106 overnight, shock on pressors, new lines placed, broad-spectrum antibiotics 3/22: Awake, appears comfortable on pressure support ventilation. weaning Precedex, he is back on low-dose norepinephrine 3/23 pm back on vent at hs for resp distress / desats ? chf on cxr  3/24 t collar 24/7 3/29 tolerating ATM without difficulty 3/31 tr back to ICU -trach changed to 6 cuffless, bronched, new fever, started on antibiotics, pneumomediastinum small biapical pneumothoraces-stable 4/1 CRRT transient 4/5 started back on HD tiw, pneumothorax, pneumomediastinum resolved. Active tracheal bleeding> Bival held 4/6 reduced amiodarone to 200 mg daily, bival resumed 4/7 Off levo, tolerating trach collar.  4/9 transfer out of ICU  Consults:  Neuro IR Cardiology  Nephrology Heart Failure  EP   Procedures:  2/5-2/6:  TPA given at 428 am (total of 90 Mg) 520 am went to IR  S/P Lt common carotid arteriogram followed by complete revascularization of occluded LT MCA sup division mid M2 seg with x 1 pass with 22mx 40 mm solitaire X ret river device and penumbra aspiration with TICI 3 revascularization  ETT 2/05 >> 2/18 LIJ CVL 2/5 >>2/18  RT [place] CVL 2/18 >>3/2 RIJ HD cath 2/6 >>2/18 6 shiley cuffed trach 2/18  >>3/4  RT fem HD 2/19 >>3/2 RIJ permacath 3/2>> 3/4 shiley 4 uncuffed.... dislodged placed back 6cuffed  shiley that evening 3/5: change to #6 cuffed distal XLT  3/17 right femoral CVL >> 3/23 3/17 right femoral arterial line >>3/23 3/31 bronchoscopy : airway clear, conclusion = end of trach up against the wall obstructing airflow   3/31 trach change  to cuffed #6 XLT  4/7 PEG by IR  Significant Diagnostic Tests:  CT angio head/neck 2/05 >> occlusion of Lt MCA bifurcation Echo 2/05 >> EF less than 20%, cannot rule out apical thrombus MRI brain 2/11 > extensive acute infarction of multiple areas without large or medium vessel occlusion Echocardiogram 2/8 Left ventricular ejection fraction, by estimation, is <20%. The left  ventricle has severely decreased function. The left ventricle demonstrates  global hypokinesis. The left ventricular internal cavity size was severely  dilated. Possible small 0.8 x 0.6 cm apical thrombus.   3/16 duplex upper and lower extremities >>neg  3/17 head CT >.  New area of hypoattenuation in the left pons , left frontal encephalomalacia related to old infarct 3/31CT chestreveals extensive pneumomediastinum and subcutaneous emphysema in the lower neck.  There is less than 5% bilateral pneumothoraces.  Extensive airspace disease right greater than left consistent with ARDS or infection  3/31 head CT >> neg for new pathology Micro Data:  SARS CoV2 PCR 2/05 >> negative Influenza PCR 2/05 >> negative ... Cdiff 2/25 >> neg Trach asp 3/11>> Few candida albicans BC 3/11>> 1 of 4 with Coag neg staph  BC 3/12 x 2 > neg  BC 3/17 x 2 > neg   trach 3/24 >  no  wbc, no organisms final - nl flora  Bronchoscopy 3/31  moderate GPC, normal respiratory flora Blood cultures 3/31 Neg   Antimicrobials:  mero 2/6 -2/12 Zosyn 2/6 , 2/19 >> 2/23 vanc 2/6 >> 2/9 , 2/19 >>2/22 Ceftriaxone 3/12> 3/16 3/17 vanc >> 3/19 3/17 Anidulafungin >> 3/19 3/17 cefepime >>  3/22  3/31 vancomycin>>4/3 3/31 cefepime>>4/3 Unasyn 4/3>>4/4  Interim history/subjective:  Tachypneic this AM resulting in desats.  Appears agitated.  Objective   Blood pressure 116/79, pulse (!) 119, temperature 99.6 F (37.6 C), temperature source Axillary, resp. rate (!) 30, height 6' 1" (1.854 m), weight 105 kg, SpO2 93 %.    FiO2 (%):  [40 %] 40 %     Intake/Output Summary (Last 24 hours) at 06/25/2019 1148 Last data filed at 06/24/2019 1500 Gross per 24 hour  Intake 310.5 ml  Output --  Net 310.5 ml   Filed Weights   06/23/19 0400 06/24/19 0700 06/25/19 0338  Weight: 108.3 kg 108.6 kg 105 kg        Physical Exam. GEN: agitated man lying in bed HEENT: Trach in place, minimal secretions CV: Tachycardic, ext warm PULM: Shallow rapid inspiratory efforts followed by periods of apnea, + accessory muscle use during inspiratory episodes and resultant desats EXT: trace edema NEURO: he looks around but does not follow commands PSYCH: RASS 0 SKIN: No rashes    Resolved Hospital Problem list   HCAP 2/19, Acute metabolic encephalopathy 2nd to hypoxia and renal failure, Cardiogenic shock HIT  Mixed Cardiogenic Septic shock, present on admission Pneumomediastinum, pneumothoraces> resolved by pCXR 4/5   Assessment & Plan:   Acute on chronic respiratory failure- Cheyne stokes breathing resulting in desaturations (from CHF and stroke) Tracheostomy with intermittent bleeding- Fair amount bloody secretions persist End stage heart failure- CXR looks more volume overloaded End stage renal failure- intolerant to UF due to hypotension HIT- starting coumadin  - Discussed with Dr. Bhandari, will   try UF again but may not tolerate from BP standpoint - Trial of TXA nebs - Hold on aggressive suctioning - Told nurse to watch for sustained rather than intermittent desats - Would place [place] or facemask O2 due to patient bypassing trach during rapid breathing episodes - If gets worse, fine to send back to ICU for PAP which should resolve the desats - His long term prognosis is dismal  The patient is critically ill with multiple organ systems failure and requires high complexity decision making for assessment and support, frequent evaluation and titration of therapies, application of advanced monitoring technologies and extensive interpretation of  multiple databases. Critical Care Time devoted to patient care services described in this note independent of APP/resident time (if applicable)  is 35 minutes.   Erskine Emery MD [facility] 06/25/2019 12:15 PM Personal pager: [phone] If unanswered, please page CCM On-call: [phone]  Erskine Emery MD PCCM

## 2019-06-26 ENCOUNTER — Inpatient Hospital Stay (HOSPITAL_COMMUNITY): Payer: No Typology Code available for payment source

## 2019-06-26 DIAGNOSIS — J9601 Acute respiratory failure with hypoxia: Secondary | ICD-10-CM | POA: Diagnosis not present

## 2019-06-26 LAB — RENAL FUNCTION PANEL
Albumin: 2.3 g/dL — ABNORMAL LOW (ref 3.5–5.0)
Albumin: 2.3 g/dL — ABNORMAL LOW (ref 3.5–5.0)
Anion gap: 20 — ABNORMAL HIGH (ref 5–15)
Anion gap: 19 — ABNORMAL HIGH (ref 5–15)
BUN: 56 mg/dL — ABNORMAL HIGH (ref 6–20)
BUN: 54 mg/dL — ABNORMAL HIGH (ref 6–20)
CO2: 17 mmol/L — ABNORMAL LOW (ref 22–32)
CO2: 17 mmol/L — ABNORMAL LOW (ref 22–32)
Calcium: 9 mg/dL (ref 8.9–10.3)
Calcium: 9 mg/dL (ref 8.9–10.3)
Chloride: 100 mmol/L (ref 98–111)
Chloride: 100 mmol/L (ref 98–111)
Creatinine, Ser: 3 mg/dL — ABNORMAL HIGH (ref 0.61–1.24)
Creatinine, Ser: 2.48 mg/dL — ABNORMAL HIGH (ref 0.61–1.24)
GFR calc Af Amer: 30 mL/min — ABNORMAL LOW (ref >60)
GFR calc Af Amer: 38 mL/min — ABNORMAL LOW (ref >60)
GFR calc non Af Amer: 26 mL/min — ABNORMAL LOW (ref >60)
GFR calc non Af Amer: 32 mL/min — ABNORMAL LOW (ref >60)
Glucose, Bld: 180 mg/dL — ABNORMAL HIGH (ref 70–99)
Glucose, Bld: 130 mg/dL — ABNORMAL HIGH (ref 70–99)
Phosphorus: 4.2 mg/dL (ref 2.5–4.6)
Phosphorus: 3.7 mg/dL (ref 2.5–4.6)
Potassium: 5.3 mmol/L — ABNORMAL HIGH (ref 3.5–5.1)
Potassium: 5.6 mmol/L — ABNORMAL HIGH (ref 3.5–5.1)
Sodium: 137 mmol/L (ref 135–145)
Sodium: 136 mmol/L (ref 135–145)

## 2019-06-26 LAB — GLUCOSE, CAPILLARY
Glucose-Capillary: 117 mg/dL — ABNORMAL HIGH (ref 70–99)
Glucose-Capillary: 141 mg/dL — ABNORMAL HIGH (ref 70–99)
Glucose-Capillary: 165 mg/dL — ABNORMAL HIGH (ref 70–99)
Glucose-Capillary: 181 mg/dL — ABNORMAL HIGH (ref 70–99)
Glucose-Capillary: 183 mg/dL — ABNORMAL HIGH (ref 70–99)
Glucose-Capillary: 186 mg/dL — ABNORMAL HIGH (ref 70–99)
Glucose-Capillary: 192 mg/dL — ABNORMAL HIGH (ref 70–99)

## 2019-06-26 LAB — CBC
HCT: 30.5 % — ABNORMAL LOW (ref 39.0–52.0)
Hemoglobin: 9.1 g/dL — ABNORMAL LOW (ref 13.0–17.0)
MCH: 31.5 pg (ref 26.0–34.0)
MCHC: 29.8 g/dL — ABNORMAL LOW (ref 30.0–36.0)
MCV: 105.5 fL — ABNORMAL HIGH (ref 80.0–100.0)
Platelets: 265 10*3/uL (ref 150–400)
RBC: 2.89 MIL/uL — ABNORMAL LOW (ref 4.22–5.81)
RDW: 19.4 % — ABNORMAL HIGH (ref 11.5–15.5)
WBC: 14.9 10*3/uL — ABNORMAL HIGH (ref 4.0–10.5)
nRBC: 0.1 % (ref 0.0–0.2)

## 2019-06-26 LAB — PROTIME-INR
INR: 1.6 — ABNORMAL HIGH (ref 0.8–1.2)
Prothrombin Time: 19 seconds — ABNORMAL HIGH (ref 11.4–15.2)

## 2019-06-26 LAB — MAGNESIUM: Magnesium: 1.6 mg/dL — ABNORMAL LOW (ref 1.7–2.4)

## 2019-06-26 LAB — APTT
aPTT: 41 seconds — ABNORMAL HIGH (ref 24–36)
aPTT: 70 seconds — ABNORMAL HIGH (ref 24–36)

## 2019-06-26 LAB — PROCALCITONIN: Procalcitonin: 2.86 ng/mL

## 2019-06-26 MED ORDER — NOREPINEPHRINE 4 MG/250ML-% IV SOLN
0.0000 ug/min | INTRAVENOUS | Status: DC
  Administered 2019-06-26: 13:00:00 2 ug/min via INTRAVENOUS
  Administered 2019-06-28: 8 ug/min via INTRAVENOUS
  Administered 2019-06-28: 10:00:00 4 ug/min via INTRAVENOUS
  Filled 2019-06-26 (×3): qty 250

## 2019-06-26 MED ORDER — PRISMASOL BGK 0/2.5 32-2.5 MEQ/L IV SOLN
INTRAVENOUS | Status: DC
  Filled 2019-06-26 (×31): qty 5000

## 2019-06-26 MED ORDER — ACETAMINOPHEN 325 MG PO TABS: 650.0000 mg | ORAL_TABLET | Freq: Four times a day (QID) | ORAL | Status: DC | PRN

## 2019-06-26 MED ORDER — ACETAMINOPHEN 160 MG/5ML PO SOLN
650.0000 mg | Freq: Once | ORAL | Status: AC
  Administered 2019-06-26: 650 mg
  Filled 2019-06-26: qty 20.3

## 2019-06-26 MED ORDER — PRISMASOL BGK 4/2.5 32-4-2.5 MEQ/L REPLACEMENT SOLN
Status: DC
  Filled 2019-06-26 (×3): qty 5000

## 2019-06-26 MED ORDER — WHITE PETROLATUM EX OINT: TOPICAL_OINTMENT | CUTANEOUS | Status: DC | PRN

## 2019-06-26 MED ORDER — WARFARIN SODIUM 3 MG PO TABS
3.0000 mg | ORAL_TABLET | Freq: Once | ORAL | Status: AC
  Administered 2019-06-26: 3 mg via ORAL
  Filled 2019-06-26: qty 1

## 2019-06-26 MED ORDER — PRISMASOL BGK 4/2.5 32-4-2.5 MEQ/L REPLACEMENT SOLN
Status: DC
  Filled 2019-06-26 (×5): qty 5000

## 2019-06-26 MED ORDER — PRISMASOL BGK 4/2.5 32-4-2.5 MEQ/L IV SOLN
INTRAVENOUS | Status: DC
  Filled 2019-06-26 (×8): qty 5000

## 2019-06-26 MED ORDER — MAGNESIUM SULFATE 2 GM/50ML IV SOLN
2.0000 g | Freq: Once | INTRAVENOUS | Status: AC
  Administered 2019-06-26: 2 g via INTRAVENOUS
  Filled 2019-06-26: qty 50

## 2019-06-26 NOTE — Progress Notes (Signed)
[facility] NEPHROLOGY PROGRESS NOTE  Assessment/ Plan: Pt is a 34 y.o. yo male with history of CHF, myocarditis, and NICM, stroke, hospital course complicated by septic shock, HCAP and AKI requiring CRRT.  #Acute kidney injury multifactorial etiology including ATN following cardiogenic/septic shock; anuric and now dialysis dependent MWF. Initially discontinued CRRT on 4/4 and then tried intermittent hemodialysis.  Last IHD was on 4/9. Patient is now febrile, hypotensive, hypoxic and concern for fluid overload.  Unable to UF with current blood pressure.  Discussed with ICU, Dr. Tamala Julian.  Plan to move patient to ICU and initiate CRRT.  UF as tolerated, 4K bath.  Overall prognosis is poor given no improvement in encephalopathy and remains vent and dialysis dependent.  Palliative care has seen the patient.  #Acute stroke: Likely embolic with bilateral infarcts and LV thrombus.  Has residual encephalopathy.  Gastric tube placed by IR on 4/8.  #Hypoxic respiratory failure, prolonged ventilator dependent, has tracheostomy.  Pulmonary following.  #Shock: Probably need Levophed during CRRT.  Blood pressure soft.  On midodrine 10 mg 3 times daily.  #Anemia: Continue ESA.  No Iron because of concern of infection.  Transfuse as needed.  # CKD-MBD;  PTH 70.  Discontinue Renvela since patient will be on CRRT.   Subjective: Unable to do dialysis overnight because patient was unstable.  Currently patient is lethargic, hypotensive and febrile.  Discussed with the nurse and ICU team. Objective Vital signs in last 24 hours: Vitals:   06/26/19 0800 06/26/19 0825 06/26/19 0827 06/26/19 0838  BP: (!) 80/52   101/62  Pulse: (!) 106 (!) 104  (!) 103  Resp: (!) 52 (!) 40  (!) 51  Temp: (!) 100.9 F (38.3 C)     TempSrc: Axillary     SpO2: (!) 86% 91% 91% 92%  Weight:      Height:       Weight change: 9.6 kg  Intake/Output Summary (Last 24 hours) at 06/26/2019 0914 Last data filed at  06/25/2019 1900 Gross per 24 hour  Intake 373.09 ml  Output --  Net 373.09 ml       Labs: Basic Metabolic Panel: Recent Labs  Lab 06/25/19 0233 06/25/19 1451 06/26/19 0451  NA 134* 135 137  K 3.9 4.2 5.3*  CL 98 99 100  CO2 24 22 17*  GLUCOSE 130* 163* 180*  BUN 38* 47* 56*  CREATININE 2.44* 2.92* 3.00*  CALCIUM 8.9 8.8* 9.0  PHOS 3.0 3.5 4.2   Liver Function Tests: Recent Labs  Lab 06/25/19 0233 06/25/19 1451 06/26/19 0451  ALBUMIN 2.2* 2.1* 2.3*   No results for input(s): LIPASE, AMYLASE in the last 168 hours. No results for input(s): AMMONIA in the last 168 hours. CBC: Recent Labs  Lab 06/22/19 0427 06/22/19 0427 06/23/19 0828 06/23/19 0828 06/24/19 0403 06/25/19 0233 06/26/19 0458  WBC 9.5   < > 7.7   < > 6.0 9.2 14.9*  HGB 8.2*   < > 8.9*   < > 9.1* 9.0* 9.1*  HCT 28.2*   < > 29.9*   < > 30.9* 30.1* 30.5*  MCV 106.0*  --  104.9*  --  103.3* 102.7* 105.5*  PLT 193   < > 182   < > 204 240 265   < > = values in this interval not displayed.   Cardiac Enzymes: No results for input(s): CKTOTAL, CKMB, CKMBINDEX, TROPONINI in the last 168 hours. CBG: Recent Labs  Lab 06/25/19 1542 06/25/19 1918 06/26/19 0007 06/26/19  0429 06/26/19 0728  GLUCAP 148* 145* 186* 183* 192*    Iron Studies: No results for input(s): IRON, TIBC, TRANSFERRIN, FERRITIN in the last 72 hours. Studies/Results: DG Chest Port 1 View  Result Date: 06/25/2019 CLINICAL DATA:  Acute respiratory failure EXAM: PORTABLE CHEST 1 VIEW COMPARISON:  06/23/2019 FINDINGS: Tracheostomy tube and central venous line unchanged. Stable cardiac silhouette. There is diffuse airspace disease slight increased in density compared to prior. IMPRESSION: 1. Slight worsening of diffuse bilateral airspace disease. 2. Stable support apparatus. Electronically Signed   By: Suzy Bouchard M.D.   On: 06/25/2019 09:45    Medications: Infusions: . sodium chloride    . sodium chloride    . anticoagulant  sodium citrate    . anticoagulant sodium citrate    . bivalirudin (ANGIOMAX) infusion 0.5 mg/mL (Non-ACS indications) 0.068 mg/kg/hr (06/26/19 0746)  . feeding supplement (VITAL 1.5 CAL) 1,000 mL (06/26/19 0210)  . norepinephrine (LEVOPHED) Adult infusion      Scheduled Medications: . amiodarone  200 mg Per Tube Daily  . ARIPiprazole  10 mg Per Tube Daily  . aspirin  81 mg Per Tube Daily  . atorvastatin  40 mg Per Tube q1800  . chlorhexidine  15 mL Mouth Rinse BID  . chlorhexidine gluconate (MEDLINE KIT)  15 mL Mouth Rinse BID  . Chlorhexidine Gluconate Cloth  6 each Topical Q0600  . darbepoetin (ARANESP) injection - DIALYSIS  150 mcg Intravenous Q Mon-HD  . feeding supplement (PRO-STAT SUGAR FREE 64)  60 mL Per Tube BID  . FLUoxetine  20 mg Per Tube Daily  . free water  100 mL Per Tube Q4H  . insulin aspart  0-20 Units Subcutaneous Q4H  . insulin aspart  7 Units Subcutaneous Q4H  . insulin detemir  40 Units Subcutaneous BID  . mouth rinse  15 mL Mouth Rinse q12n4p  . midodrine  10 mg Per Tube Q8H  . neomycin-bacitracin-polymyxin  1 application Topical Daily  . pantoprazole sodium  40 mg Per Tube Q1200  . polyethylene glycol  17 g Per Tube Daily  . QUEtiapine  25 mg Oral BID  . sodium chloride flush  10-40 mL Intracatheter Q12H  . warfarin  3 mg Oral ONCE-1600  . Warfarin - Pharmacist Dosing Inpatient   Does not apply q1600    have reviewed scheduled and prn medications.  Physical Exam: General: 59 ill-looking male with trach, hypotensive. Heart:RRR, s1s2 nl, no rubs Lungs: Coarse breath sound bilateral Abdomen:soft, Non-tender, gastric feeding tube present. Extremities: Trace LE edema Dialysis Access: Right IJ TDC placed by IR on 3/2.  Pearla Mckinny Prasad Creedence Heiss 06/26/2019,9:14 AM  LOS: 65 days  Pager: [phone]

## 2019-06-26 NOTE — Progress Notes (Addendum)
ANTICOAGULATION CONSULT NOTE  Pharmacy Consult:  Bivalirudin and Warfarin Indication: stroke 2/5, LV thrombus 2/8, HIT 2/15  Allergies  Allergen Reactions  . Heparin Other (See Comments)    Heparin induced thrombocytopenia. 2/15 HIT OD 1.692. 2/16 SRA positive-90.     Patient Measurements: Height: 6\' 1"  (185.4 cm) Weight: 114.6 kg (252 lb 10.4 oz) IBW/kg (Calculated) : 79.9   Vital Signs: Temp: 97.6 F (36.4 C) (04/11 0405) Temp Source: Oral (04/11 0405) BP: 99/77 (04/11 0405) Pulse Rate: 109 (04/11 0405)  Labs: Recent Labs    06/24/19 0403 06/24/19 0403 06/24/19 0904 06/24/19 1504 06/25/19 0233 06/25/19 1451 06/26/19 0451 06/26/19 0458  HGB 9.1*   < >  --   --  9.0*  --   --  9.1*  HCT 30.9*  --   --   --  30.1*  --   --  30.5*  PLT 204  --   --   --  240  --   --  265  APTT 38*  --    < > 67* 68*  --   --  41*  LABPROT 16.8*  --   --   --  18.2*  --   --  19.0*  INR 1.4*  --   --   --  1.5*  --   --  1.6*  CREATININE 2.97*  3.06*   < >  --  2.07* 2.44* 2.92* 3.00*  --    < > = values in this interval not displayed.    Estimated Creatinine Clearance: 45.6 mL/min (A) (by C-G formula based on SCr of 3 mg/dL (H)).  Assessment: 34 yr old male with history of ESRD on HD presented with left MCA infarct - received TPA and revascularization with IR on 2/5. On 2/8 found to have small LV apical thrombus on ECHO. Pharmacy consulted to dose IV heparin, which was switched to bivalirudin 2/15 for HIT. 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 SRA very positive at 52. Heparin allergy has appropriately been added to chart and updated with SRA results. Transitioned off bivalrudin on 3/14. Restarted bivalirudin 3/26 and briefly held on 3/31 for chest tube placement.   Bivalirudin was held on 4/5 due to significant bleeding from trach and patient was intended to receive PEG on 4/6; however procedure was delayed and bivalirudin resumed targeting lower  end of therapeutic range due to trach bleeding. Bivalirudin held again 4/8 AM for PEG procedure and resumed after PEG placement on 4/8 PM. Pharmacy has been consulted to resume warfarin now that PEG has been placed and continue bivalirudin until INR >3 and then recheck INR and aPTT 4-6 hours later  aPTT this morning of 41 is subtherapeutic on bivalirudin 0.057mg /kghr. INR 1.6. CBC stable.   Goal of Therapy:  aPTT 50-85 seconds INR 2-3 Monitor platelets by anticoagulation protocol: Yes   Plan:  Increase bivalirudin to 0.068 mg/kg/hr Recheck aPTT in 2 hours Warfarin 3 mg x 1 tonight Monitor bleeding from trach site  Monitor daily aPTT, CBC, INR   Addendum: aPTT on recheck after rate increase is therapeutic at 70s Continue bivalirudin at 0.068 mg/kg/hr  Vertis Kelch, PharmD, [facility] PGY2 Cardiology Pharmacy Resident Phone [phone] 06/26/2019       7:31 AM  Please check [email or web address] for unit-specific pharmacist phone numbers

## 2019-06-26 NOTE — Progress Notes (Signed)
Patient sent back from dialysis as dialysis nurse stated patient O2Sats would not stay long enough above 90%. Dialysis was rescheduled for day time when more resources are available per dialysis nurse.

## 2019-06-26 NOTE — Progress Notes (Signed)
NAME:  Carlos Brewer., MRN:  [id number], DOB:  1985/04/03, LOS: 92 ADMISSION DATE:  05/08/2019, CONSULTATION DATE:  05/12/2019 REFERRING MD:  Dr. Leonel Ramsay, CHIEF COMPLAINT:  Slurred speech  Brief History   34 yo male smoker found to have slurred speech and Rt sided weakness.  Admitted with left MCA CVA and multiple smaller embolic infarcts>>>  tPA and thrombectomy in IR.  Additionally found to have left ventricular  thrombus.    Course complicated by septic shock r/t HCAP, AKI requiring CRRT, polymorphic VT and cardiogenic shock.  Intermittent pressor dependence, required tracheostomy Intermittent hemodialysis [place] CVA Heart failure with reduced ejection fraction < 15% on last echocardiogram  Past Medical History  Systolic CHF with non ischemic CM, Cocaine abuse, OSA, DM Hx of CHF (EF 15%)  Hx myocarditis Smoker 1/2 ppd   Significant Hospital Events   2/05 Admit, tPA, IR thrombectomy 2/6 Shock requiring increasing levophed and phenylephrine.   2/6: switched to levophed, epi, started antibiotics, started on CRRT.  2/9  developed wide-complex tachycardia and hypotension, started on amiodarone drip, increase Levophed drip 2/15 drop in platelets, heparin stopped, bival started 2/17 Hypotensive and back on Levophed 2/18 trach, lines changed 2/19 started milrinone , fever 103 2/20 Episode of wide-complex tachycardia yesterday, changed from Levophed to vasopressin 2/22 stopping vanc. Still on inotrope support. Some AF w/ RVR. Had to be placed back on pressors. ivabradine added 2/23 still on pressors/ CRRT continued 2/24 tmax 98.4, Remains on CRRT, even UF, remains anuric, Levophed at 14 mcg/min, Milrinone at 0.125 mcg/kg/min, Coox 63.4 Doing well this morning on ATC, no events overnight.  Midodrine added   2/25 more interactive, remains on ATC, tmax 99.5/ WBC 21.4, ~900 ml liquid stool/ 24 hours, neg Cdiff, Levophed up to 20 mcg/min, CVP 2, ,milrinone remains at 0.125  mcg/kg/min, coox 92.7, Off CRRT since last night ~2000 s/p clotted x 3 off citrate, restarted  3/1 Amio drip restarted for WCT/atrial flutter, milrinone turned off 3/2 CRRT stopped, permacath placed 3/5 crrt resumed 3/6 started on pressors for crrt tolerance 3/8 No longer on CRRT or pressor support  3/12 Tolerated PS for >2 hours  3/17  febrile 106 overnight, shock on pressors, new lines placed, broad-spectrum antibiotics 3/22: Awake, appears comfortable on pressure support ventilation. weaning Precedex, he is back on low-dose norepinephrine 3/23 pm back on vent at hs for resp distress / desats ? chf on cxr  3/24 t collar 24/7 3/29 tolerating ATM without difficulty 3/31 tr back to ICU -trach changed to 6 cuffless, bronched, new fever, started on antibiotics, pneumomediastinum small biapical pneumothoraces-stable 4/1 CRRT transient 4/5 started back on HD tiw, pneumothorax, pneumomediastinum resolved. Active tracheal bleeding> Bival held 4/6 reduced amiodarone to 200 mg daily, bival resumed 4/7 Off levo, tolerating trach collar.  4/9 transfer out of ICU 4/10 desaturations, too "unastable" for HD from respiratory standpoint 4/11 send back to ICU for CRRT  Consults:  Neuro IR Cardiology  Nephrology Heart Failure  EP   Procedures:  2/5-2/6:  TPA given at 428 am (total of 90 Mg) 520 am went to IR  S/P Lt common carotid arteriogram followed by complete revascularization of occluded LT MCA sup division mid M2 seg with x 1 pass with 45mx 40 mm solitaire X ret river device and penumbra aspiration with TICI 3 revascularization  ETT 2/05 >> 2/18 [facility]J CVL 2/5 >>2/18  RT [place] CVL 2/18 >>3/2 RIJ HD cath 2/6 >>2/18 6 shiley cuffed trach 2/18  >>3/4  RT fem HD 2/19 >>3/2 RIJ permacath 3/2>> 3/4 shiley 4 uncuffed.... dislodged placed back 6cuffed shiley that evening 3/5: change to #6 cuffed distal XLT  3/17 right femoral CVL >> 3/23 3/17 right femoral arterial line >>3/23 3/31 bronchoscopy  : airway clear, conclusion = end of trach up against the wall obstructing airflow   3/31 trach change to cuffed #6 XLT  4/7 PEG by IR  Significant Diagnostic Tests:  CT angio head/neck 2/05 >> occlusion of Lt MCA bifurcation Echo 2/05 >> EF less than 20%, cannot rule out apical thrombus MRI brain 2/11 > extensive acute infarction of multiple areas without large or medium vessel occlusion Echocardiogram 2/8 Left ventricular ejection fraction, by estimation, is <20%. The left  ventricle has severely decreased function. The left ventricle demonstrates  global hypokinesis. The left ventricular internal cavity size was severely  dilated. Possible small 0.8 x 0.6 cm apical thrombus.   3/16 duplex upper and lower extremities >>neg  3/17 head CT >.  New area of hypoattenuation in the left pons , left frontal encephalomalacia related to old infarct 3/31CT chestreveals extensive pneumomediastinum and subcutaneous emphysema in the lower neck.  There is less than 5% bilateral pneumothoraces.  Extensive airspace disease right greater than left consistent with ARDS or infection  3/31 head CT >> neg for new pathology Micro Data:  SARS CoV2 PCR 2/05 >> negative Influenza PCR 2/05 >> negative ... Cdiff 2/25 >> neg Trach asp 3/11>> Few candida albicans BC 3/11>> 1 of 4 with Coag neg staph  BC 3/12 x 2 > neg  BC 3/17 x 2 > neg   trach 3/24 >  no  wbc, no organisms final - nl flora  Bronchoscopy 3/31  moderate GPC, normal respiratory flora Blood cultures 3/31 Neg   Antimicrobials:  mero 2/6 -2/12 Zosyn 2/6 , 2/19 >> 2/23 vanc 2/6 >> 2/9 , 2/19 >>2/22 Ceftriaxone 3/12> 3/16 3/17 vanc >> 3/19 3/17 Anidulafungin >> 3/19 3/17 cefepime >>  3/22  3/31 vancomycin>>4/3 3/31 cefepime>>4/3 Unasyn 4/3>>4/4  Interim history/subjective:  Remains tachypneic in 40-60s refractory to sedating measures.  Also low grade fever.  Deemed too unstable from respiratory standpoint to go to HD  yesterday.  Objective   Blood pressure 101/62, pulse (!) 103, temperature (!) 100.9 F (38.3 C), temperature source Axillary, resp. rate (!) 51, height 6' 1"  (1.854 m), weight 114.6 kg, SpO2 92 %.    FiO2 (%):  [40 %] 40 %   Intake/Output Summary (Last 24 hours) at 06/26/2019 0858 Last data filed at 06/25/2019 1900 Gross per 24 hour  Intake 373.09 ml  Output --  Net 373.09 ml   Filed Weights   06/24/19 0700 06/25/19 0338 06/26/19 0405  Weight: 108.6 kg 105 kg 114.6 kg        Physical Exam. GEN: agitated man lying in bed HEENT: Trach in place, minimal bloody secretions CV: Tachycardic, ext warm PULM: Shallow rapid inspiratory efforts followed by periods of apnea, + accessory muscle use during inspiratory episodes and resultant desats EXT: trace edema NEURO: he looks around but does not follow commands PSYCH: RASS 0 SKIN: No rashes    Resolved Hospital Problem list   HCAP 3/24, Acute metabolic encephalopathy 2nd to hypoxia and renal failure, Cardiogenic shock HIT  Mixed Cardiogenic Septic shock, present on admission Pneumomediastinum, pneumothoraces> resolved by pCXR 4/5   Assessment & Plan:   Acute on chronic respiratory failure- Cheyne stokes breathing resulting in desaturations (from CHF and stroke).  There is nothing I  can do to fix this breathing pattern other than heavy sedation.  Baseline hypoxemia is related to volume overload. Tracheostomy with intermittent bleeding- improved End stage heart failure- CXR looks more volume overloaded End stage renal failure- intolerant to UF due to hypotension HIT- starting coumadin  - Continue trial of TXA nebs - Hold on aggressive suctioning - Given low Bps, worsening sats, inability to do iHD, send back to ICU for pressors and CRRT; will take back on our service - Trying to hold off abx, will see what happens with fever curve and BPs - His long term prognosis is dismal  The patient is critically ill with multiple organ  systems failure and requires high complexity decision making for assessment and support, frequent evaluation and titration of therapies, application of advanced monitoring technologies and extensive interpretation of multiple databases. Critical Care Time devoted to patient care services described in this note independent of APP/resident time (if applicable)  is 32 minutes.   Erskine Emery MD [facility] 06/26/2019 8:58 AM Personal pager: [phone] If unanswered, please page CCM On-call: [phone]  Erskine Emery MD PCCM

## 2019-06-26 NOTE — Progress Notes (Signed)
Patient transported to dialysis

## 2019-06-26 NOTE — Progress Notes (Signed)
Patient had fever 100.9 in the morning. Notified Dr. Vista Lawman regarding this matter and received tylenol order. Administered tylenol. SpO2 stayed 80's and once while up to low 90's. Paging RT and Dr. Tamala Julian regarding this matter. Patient hadn't had dialysis yesterday. Dr. Carolin Sicks talked with Dr. Tamala Julian regarding CRRT because hypotensive. Dr. Tamala Julian deceived to send patient to ICU for CRRT. Explained pt's mom and she was upset, but Dr. Vista Lawman talked with mom then she felt better and understood it. HS [employer]

## 2019-06-27 DIAGNOSIS — J9601 Acute respiratory failure with hypoxia: Secondary | ICD-10-CM | POA: Diagnosis not present

## 2019-06-27 DIAGNOSIS — Y95 Nosocomial condition: Secondary | ICD-10-CM

## 2019-06-27 DIAGNOSIS — Z930 Tracheostomy status: Secondary | ICD-10-CM | POA: Diagnosis not present

## 2019-06-27 DIAGNOSIS — N179 Acute kidney failure, unspecified: Secondary | ICD-10-CM | POA: Diagnosis not present

## 2019-06-27 DIAGNOSIS — J189 Pneumonia, unspecified organism: Secondary | ICD-10-CM

## 2019-06-27 LAB — GLUCOSE, CAPILLARY
Glucose-Capillary: 128 mg/dL — ABNORMAL HIGH (ref 70–99)
Glucose-Capillary: 108 mg/dL — ABNORMAL HIGH (ref 70–99)
Glucose-Capillary: 115 mg/dL — ABNORMAL HIGH (ref 70–99)
Glucose-Capillary: 106 mg/dL — ABNORMAL HIGH (ref 70–99)
Glucose-Capillary: 108 mg/dL — ABNORMAL HIGH (ref 70–99)
Glucose-Capillary: 109 mg/dL — ABNORMAL HIGH (ref 70–99)

## 2019-06-27 LAB — RENAL FUNCTION PANEL
Albumin: 2.2 g/dL — ABNORMAL LOW (ref 3.5–5.0)
Albumin: 2.3 g/dL — ABNORMAL LOW (ref 3.5–5.0)
Anion gap: 17 — ABNORMAL HIGH (ref 5–15)
Anion gap: 15 (ref 5–15)
BUN: 47 mg/dL — ABNORMAL HIGH (ref 6–20)
BUN: 45 mg/dL — ABNORMAL HIGH (ref 6–20)
CO2: 22 mmol/L (ref 22–32)
CO2: 21 mmol/L — ABNORMAL LOW (ref 22–32)
Calcium: 9.4 mg/dL (ref 8.9–10.3)
Calcium: 8.7 mg/dL — ABNORMAL LOW (ref 8.9–10.3)
Chloride: 98 mmol/L (ref 98–111)
Chloride: 99 mmol/L (ref 98–111)
Creatinine, Ser: 2.17 mg/dL — ABNORMAL HIGH (ref 0.61–1.24)
Creatinine, Ser: 1.76 mg/dL — ABNORMAL HIGH (ref 0.61–1.24)
GFR calc Af Amer: 44 mL/min — ABNORMAL LOW (ref >60)
GFR calc Af Amer: 57 mL/min — ABNORMAL LOW (ref >60)
GFR calc non Af Amer: 38 mL/min — ABNORMAL LOW (ref >60)
GFR calc non Af Amer: 49 mL/min — ABNORMAL LOW (ref >60)
Glucose, Bld: 146 mg/dL — ABNORMAL HIGH (ref 70–99)
Glucose, Bld: 127 mg/dL — ABNORMAL HIGH (ref 70–99)
Phosphorus: 3.4 mg/dL (ref 2.5–4.6)
Phosphorus: 3.4 mg/dL (ref 2.5–4.6)
Potassium: 5 mmol/L (ref 3.5–5.1)
Potassium: 6.2 mmol/L — ABNORMAL HIGH (ref 3.5–5.1)
Sodium: 137 mmol/L (ref 135–145)
Sodium: 135 mmol/L (ref 135–145)

## 2019-06-27 LAB — APTT: aPTT: 71 seconds — ABNORMAL HIGH (ref 24–36)

## 2019-06-27 LAB — CBC
HCT: 28.1 % — ABNORMAL LOW (ref 39.0–52.0)
Hemoglobin: 8.2 g/dL — ABNORMAL LOW (ref 13.0–17.0)
MCH: 30.8 pg (ref 26.0–34.0)
MCHC: 29.2 g/dL — ABNORMAL LOW (ref 30.0–36.0)
MCV: 105.6 fL — ABNORMAL HIGH (ref 80.0–100.0)
Platelets: 259 10*3/uL (ref 150–400)
RBC: 2.66 MIL/uL — ABNORMAL LOW (ref 4.22–5.81)
RDW: 19.3 % — ABNORMAL HIGH (ref 11.5–15.5)
WBC: 13.4 10*3/uL — ABNORMAL HIGH (ref 4.0–10.5)
nRBC: 0.2 % (ref 0.0–0.2)

## 2019-06-27 LAB — PROCALCITONIN: Procalcitonin: 4.21 ng/mL

## 2019-06-27 LAB — MAGNESIUM: Magnesium: 2.3 mg/dL (ref 1.7–2.4)

## 2019-06-27 LAB — PROTIME-INR
INR: 1.8 — ABNORMAL HIGH (ref 0.8–1.2)
Prothrombin Time: 21.2 seconds — ABNORMAL HIGH (ref 11.4–15.2)

## 2019-06-27 MED ORDER — VANCOMYCIN HCL 1250 MG/250ML IV SOLN
1250.0000 mg | INTRAVENOUS | Status: DC
  Administered 2019-06-28: 11:00:00 1250 mg via INTRAVENOUS
  Filled 2019-06-27 (×2): qty 250

## 2019-06-27 MED ORDER — SODIUM CHLORIDE 0.9 % IV SOLN
1.0000 g | INTRAVENOUS | Status: DC
  Filled 2019-06-27: qty 1

## 2019-06-27 MED ORDER — PRISMASOL BGK 0/2.5 32-2.5 MEQ/L IV SOLN
INTRAVENOUS | Status: DC
  Filled 2019-06-27 (×9): qty 5000

## 2019-06-27 MED ORDER — SODIUM CHLORIDE 0.9 % IV SOLN
1.0000 g | Freq: Three times a day (TID) | INTRAVENOUS | Status: DC
  Filled 2019-06-27 (×2): qty 1

## 2019-06-27 MED ORDER — SODIUM CHLORIDE 0.9 % IV SOLN
1.0000 g | Freq: Three times a day (TID) | INTRAVENOUS | Status: DC
  Administered 2019-06-27 – 2019-06-29 (×7): 1 g via INTRAVENOUS
  Filled 2019-06-27 (×8): qty 1

## 2019-06-27 MED ORDER — PRISMASOL BGK 0/2.5 32-2.5 MEQ/L IV SOLN
INTRAVENOUS | Status: DC
  Filled 2019-06-27 (×6): qty 5000

## 2019-06-27 MED ORDER — VANCOMYCIN HCL 2000 MG/400ML IV SOLN
2000.0000 mg | Freq: Once | INTRAVENOUS | Status: AC
  Administered 2019-06-27: 11:00:00 2000 mg via INTRAVENOUS
  Filled 2019-06-27: qty 400

## 2019-06-27 MED ORDER — WARFARIN SODIUM 3 MG PO TABS
3.0000 mg | ORAL_TABLET | Freq: Once | ORAL | Status: AC
  Administered 2019-06-27: 3 mg via ORAL
  Filled 2019-06-27: qty 1

## 2019-06-27 NOTE — Progress Notes (Signed)
ANTICOAGULATION CONSULT NOTE  Pharmacy Consult:  Bivalirudin and Warfarin Indication: stroke 2/5, LV thrombus 2/8, HIT 2/15  Allergies  Allergen Reactions  . Heparin Other (See Comments)    Heparin induced thrombocytopenia. 2/15 HIT OD 1.692. 2/16 SRA positive-90.     Patient Measurements: Height: 6\' 1"  (185.4 cm) Weight: 106.2 kg (234 lb 2.1 oz) IBW/kg (Calculated) : 79.9   Vital Signs: Temp: 97.8 F (36.6 C) (04/12 0354) Temp Source: Oral (04/12 0354) BP: 85/58 (04/12 0600) Pulse Rate: 89 (04/12 0600)  Labs: Recent Labs    06/25/19 0233 06/25/19 0233 06/25/19 1451 06/26/19 0451 06/26/19 0458 06/26/19 0926 06/26/19 2011 06/27/19 0538  HGB 9.0*   < >  --   --  9.1*  --   --  8.2*  HCT 30.1*  --   --   --  30.5*  --   --  28.1*  PLT 240  --   --   --  265  --   --  259  APTT 68*   < >  --   --  41* 70*  --  71*  LABPROT 18.2*  --   --   --  19.0*  --   --  21.2*  INR 1.5*  --   --   --  1.6*  --   --  1.8*  CREATININE 2.44*   < > 2.92* 3.00*  --   --  2.48*  --    < > = values in this interval not displayed.    Estimated Creatinine Clearance: 53.2 mL/min (A) (by C-G formula based on SCr of 2.48 mg/dL (H)).  Assessment: 34 yr old male with history of ESRD on HD presented with left MCA infarct - received TPA and revascularization with IR on 2/5. On 2/8 found to have small LV apical thrombus on ECHO. Pharmacy consulted to dose IV heparin, which was switched to bivalirudin 2/15 for HIT. 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 SRA very positive at 58. Heparin allergy has appropriately been added to chart and updated with SRA results. Transitioned off bivalrudin on 3/14. Restarted bivalirudin 3/26 and briefly held on 3/31 for chest tube placement.   Bivalirudin was held on 4/5 due to significant bleeding from trach and patient was intended to receive PEG on 4/6; however procedure was delayed and bivalirudin resumed targeting lower end of  therapeutic range due to trach bleeding. Bivalirudin held again 4/8 AM for PEG procedure and resumed after PEG placement on 4/8 PM. Pharmacy has been consulted to resume warfarin now that PEG has been placed and continue bivalirudin until INR >3 and then recheck INR and aPTT 4-6 hours later  aPTT this morning is therapeutic at 71 on bivalirudin 0.068 mg/kghr. INR 1.8. CBC stable.   Goal of Therapy:  aPTT 50-85 seconds INR 2-3 Monitor platelets by anticoagulation protocol: Yes   Plan:  Increase bivalirudin to 0.068 mg/kg/hr Warfarin 3 mg x 1 tonight Monitor bleeding from trach site  Monitor daily aPTT, CBC, INR  Lorel Monaco, PharmD PGY1 Ambulatory Care Resident [employer] # [phone]  Please check [email or web address] for unit-specific pharmacist phone numbers

## 2019-06-27 NOTE — Consult Note (Signed)
[place] Nurse wound follow up Patient receiving care in [facility] 2M05.   Wound type: Healing stage 2 PI to left heel Measurement: 1.2 cm x 1 cm Wound bed: 100% pink, just needs to reepithelialize Drainage (amount, consistency, odor) none Periwound: intact Dressing procedure/placement/frequency: Xeroform and gauze over area at the time of my assessment.  Wound care changed to reflect current dressing selection by RNs. Monitor the wound area(s) for worsening of condition such as: Signs/symptoms of infection,  Increase in size,  Development of or worsening of odor, Development of pain, or increased pain at the affected locations.  Notify the medical team if any of these develop. Val Riles, RN, MSN, CWOCN, CNS-BC, pager [phone]

## 2019-06-27 NOTE — Progress Notes (Signed)
Physical Therapy Treatment Patient Details Name: Carlos Brewer. MRN: [id number] DOB: 1985-06-30 Today's Date: 06/27/2019    History of Present Illness Pt is a 34 y/o male smoker who initially presented on 2/5 with slurred speech and Rt sided weakness. Admitted with left MCA CVA and multiple smaller embolic infarcts s/p tPA and thrombectomy in IR.  Additionally found to have left ventricular  thrombus. Hospital course complicated by septic shock r/t HCAP, AKI requiring CRRT, polymorphic VT and cardiogenic shock. Echo EF < 20%. Pt with Intermittent pressor dependence, required tracheostomy. Tolerating trach collar 3/4, back on vent 3/5 with flash pulmonary edema.  Started IHD 3/10.  G-tube placed 06/23/19.    PT Comments    Pt initially lethargic on CRRT.  Warm up exercise and interaction started his arousal.  Emphasis on sitting and truncal activation.  Ended in the bed due to notable fatigue.   Follow Up Recommendations  LTACH     Equipment Recommendations  Other (comment)    Recommendations for Other Services       Precautions / Restrictions Precautions Precautions: Fall Precaution Comments: trach collar, watch BP, NG    Mobility  Bed Mobility Overal bed mobility: Needs Assistance       Supine to sit: Total assist;+2 for physical assistance Sit to supine: Total assist;+2 for physical assistance   General bed mobility comments: pt unable to come up and forward without significant assist  Transfers                 General transfer comment: deferred today, pt not yet ready to stand and on CRRT  Ambulation/Gait             General Gait Details: unable   Stairs             Wheelchair Mobility    Modified Rankin (Stroke Patients Only) Modified Rankin (Stroke Patients Only) Pre-Morbid Rankin Score: No symptoms Modified Rankin: Severe disability     Balance Overall balance assessment: Needs assistance Sitting-balance support: Feet  supported;Single extremity supported Sitting balance-Leahy Scale: Poor Sitting balance - Comments: Worked on balance, activation of LE and completing weak LAQ R>L with cerical rotation and spinal extension.  Generally need consistent assist, but maintain balance in/out of BOS for a few seconds until fatigued.                                    Cognition Arousal/Alertness: Awake/alert Behavior During Therapy: Flat affect Overall Cognitive Status: Impaired/Different from baseline                     Current Attention Level: Sustained;Selective   Following Commands: Follows one step commands with increased time Safety/Judgement: Decreased awareness of safety;Decreased awareness of deficits Awareness: Intellectual Problem Solving: Slow processing;Decreased initiation;Requires verbal cues;Requires tactile cues        Exercises Other Exercises Other Exercises: warm up ROM exercise to activate LE's    General Comments        Pertinent Vitals/Pain Pain Assessment: No/denies pain Pain Intervention(s): Monitored during session    Home Living                      Prior Function            PT Goals (current goals can now be found in the care plan section) Acute Rehab PT Goals Patient Stated Goal: to d/c home  with mother after rehab PT Goal Formulation: With patient/family Time For Goal Achievement: 07/04/19 Potential to Achieve Goals: Fair Progress towards PT goals: Progressing toward goals(starting upward climb again, continue goals again)    Frequency    Min 3X/week      PT Plan Current plan remains appropriate    Co-evaluation              AM-PAC PT "6 Clicks" Mobility   Outcome Measure  Help needed turning from your back to your side while in a flat bed without using bedrails?: Total Help needed moving from lying on your back to sitting on the side of a flat bed without using bedrails?: Total Help needed moving to and from a  bed to a chair (including a wheelchair)?: Total Help needed standing up from a chair using your arms (e.g., wheelchair or bedside chair)?: Total Help needed to walk in hospital room?: Total Help needed climbing 3-5 steps with a railing? : Total 6 Click Score: 6    End of Session Equipment Utilized During Treatment: Oxygen Activity Tolerance: Patient limited by fatigue Patient left: in bed;with call bell/phone within reach;with bed alarm set Nurse Communication: Mobility status PT Visit Diagnosis: Other abnormalities of gait and mobility (R26.89);Other symptoms and signs involving the nervous system (R29.898);Muscle weakness (generalized) (M62.81)     Time: 1540-1610 PT Time Calculation (min) (ACUTE ONLY): 30 min  Charges:  $Therapeutic Activity: 8-22 mins $Neuromuscular Re-education: 8-22 mins                     06/27/2019  Ginger Carne., PT Acute Rehabilitation Services [phone]  (pager) [phone]  (office)   Tessie Fass Isiaih Hollenbach 06/27/2019, 5:25 PM

## 2019-06-27 NOTE — Progress Notes (Signed)
K+ 6.2 called to DR Posey Pronto

## 2019-06-27 NOTE — Progress Notes (Addendum)
Pharmacy Antibiotic Note  Carlos Brewer. is a 34 y.o. male admitted on 05/14/2019 with stroke.  Pharmacy has been consulted for Vancomycin/Meropenem dosing for fever and shivering. Patient has had a prolonged hospital stay with previous abx treatment. Patient is ESRD previously on HD, however unable to tolerate due to hypotension and is now on CRRT. Tmax 100.61F, WBC 13.4, PCT 2.86>4.21.  Plan: Give vancomycin 2000 mg IV x1 loading dose Start vancomycin 1250 mg IV q24h starting tomorrow (4/13) Start Meropenem 1 g IV q8h Plan for 48 hours of abx and f/u cultures.   Monitor plans for CRRT transition back to iHD  Height: 6\' 1"  (185.4 cm) Weight: 106.2 kg (234 lb 2.1 oz) IBW/kg (Calculated) : 79.9  Temp (24hrs), Avg:98.4 F (36.9 C), Min:97.3 F (36.3 C), Max:100.1 F (37.8 C)  Recent Labs  Lab 06/23/19 0828 06/23/19 2019 06/24/19 0403 06/24/19 1504 06/25/19 0233 06/25/19 1451 06/26/19 0451 06/26/19 0458 06/26/19 2011 06/27/19 0538  WBC 7.7  --  6.0  --  9.2  --   --  14.9*  --  13.4*  CREATININE 2.55*   < > 2.97*  3.06*   < > 2.44* 2.92* 3.00*  --  2.48* 2.17*   < > = values in this interval not displayed.    Estimated Creatinine Clearance: 60.8 mL/min (A) (by C-G formula based on SCr of 2.17 mg/dL (H)).    Allergies  Allergen Reactions  . Heparin Other (See Comments)    Heparin induced thrombocytopenia. 2/15 HIT OD 1.692. 2/16 SRA positive-90.     Vanc 2/6>>2/9, restart 2/19 >>2/22, 3/17>> 3/20, 3/31>4/3 Cefepime 3/17>> 3/22, 3/31>4/3 Unasyn 4/3>> 4/4 Eraxis 3/17>> 3/20 Zosyn 2/6x1, restart 2/19 >>2/23 Merrem 2/6>>2/12 CTX 3/12 >> 3/16 Vancomycin 4/12>> Meropenem 4/12>>  Microbiology Results 2/5 covid / flu - negative 2/5 MRSA PCR - positive 2/6 UCx - negative 2/6 TA - negative 2/6 BCx - negative 2/16 TA - negative 2/19 BCx - Neg 2/25 cdiff - neg 2/25 BCx - neg 3/5 TA - neg 3/10 mrsa pcr+ 3/11 Bcx: 1/4 coag neg staph 3/11 Resp Cx: few  candida albicans 3/12 BCx - ngtd 3/17 Bcx: NG 3/31 TA: mod GPC in clusters (reincubated) > normal flora 3/31 bcx: ngtd 4/12 BCx: pending   Lorel Monaco, PharmD PGY1 Ambulatory Care Resident [employer] # [phone]

## 2019-06-27 NOTE — Progress Notes (Signed)
Patient ID: Carlos Brewer., male   DOB: [birth year], 34 y.o.   MRN: [id number] [facility] Progress Note   Assessment/ Plan:   1. End stage renal disease following prolonged dialysis dependent AKI: multifactorial etiology including ATN following cardiogenic/septic shock; anuric and now dialysis dependent MWF. Attempted transition to IHD that was transiently successful and then limited by hypotension and progressive volume overload indicating transfer back to ICU and CRRT (net UF 7m/h). Long term prognosis remains poor. 2. Acute stroke: Likely embolic with bilateral infarcts and LV thrombus.  Has residual encephalopathy.  Gastric tube placed by IR on 4/8. On heparin and bivalrudin. 3. Hypoxic respiratory failure: prolonged ventilator dependent, has tracheostomy.  Pulmonary following. 4. Shock: On midodrine and appears to be tolerating CRRT/UF- levophed currently on hold. 5. Anemia: Continue ESA.  No Iron because of concern of infection.  Transfuse as needed. 6. CKD-MBD: Calcium and phosphorus levels at goal, PTH 70.  Subjective:   Remains intermittently hypotensive overnight. CRRT filter life ~15hr.   Objective:   BP 96/69   Pulse 92   Temp 97.8 F (36.6 C) (Oral)   Resp (!) 41   Ht _0  (1.854 m)   Wt 106.2 kg   SpO2 97%   BMI 30.89 kg/m   Intake/Output Summary (Last 24 hours) at 06/27/2019 09774Last data filed at 06/27/2019 0600 Gross per 24 hour  Intake 2731.56 ml  Output 2590 ml  Net 141.56 ml   Weight change: -8.4 kg  Physical Exam: GFSE:LTRVUYEcomfortable on ventilator via trach CBXI:DHWYSregular rhythm and normal rate. s1 and s2 normal. RIJ TDC Resp:Anteriorly coarse breath sounds bilaterally, no rales/rhonchi AHUO:HFGB obese, non-tender, PEG tube in situ Ext: trace LE edema bilaterally  Imaging: DG Chest Port 1V same Day  Result Date: 06/26/2019 CLINICAL DATA:  Respiratory distress.  Cerebral vascular accident. EXAM: PORTABLE CHEST 1 VIEW  COMPARISON:  06/25/2019 FINDINGS: Tracheostomy tube central venous line unchanged. Stable enlarged cardiac silhouette. Diffuse airspace disease unchanged from prior. Greatest density in the RIGHT lower lobe. No pneumothorax. IMPRESSION: 1. No interval change.  Stable support apparatus. 2. Diffuse bilateral severe airspace disease. Electronically Signed   By: SSuzy BouchardM.D.   On: 06/26/2019 09:17    Labs: BMET Recent Labs  Lab 06/24/19 0403 06/24/19 1504 06/25/19 0233 06/25/19 1451 06/26/19 0451 06/26/19 2011 06/27/19 0538  NA 140  139 138 134* 135 137 136 137  K 3.8  3.7 3.6 3.9 4.2 5.3* 5.6* 5.0  CL 99  97* 98 98 99 100 100 98  CO2 _1 17* 17* 22  GLUCOSE 92  94 127* 130* 163* 180* 130* 146*  BUN 47*  48* 26* 38* 47* 56* 54* 47*  CREATININE 2.97*  3.06* 2.07* 2.44* 2.92* 3.00* 2.48* 2.17*  CALCIUM 9.4  9.4 8.8* 8.9 8.8* 9.0 9.0 9.4  PHOS 4.3 2.4* 3.0 3.5 4.2 3.7 3.4   CBC Recent Labs  Lab 06/24/19 0403 06/25/19 0233 06/26/19 0458 06/27/19 0538  WBC 6.0 9.2 14.9* 13.4*  HGB 9.1* 9.0* 9.1* 8.2*  HCT 30.9* 30.1* 30.5* 28.1*  MCV 103.3* 102.7* 105.5* 105.6*  PLT 204 240 265 259    Medications:    . amiodarone  200 mg Per Tube Daily  . ARIPiprazole  10 mg Per Tube Daily  . aspirin  81 mg Per Tube Daily  . atorvastatin  40 mg Per Tube q1800  . chlorhexidine  15 mL Mouth Rinse BID  .  chlorhexidine gluconate (MEDLINE KIT)  15 mL Mouth Rinse BID  . Chlorhexidine Gluconate Cloth  6 each Topical Q0600  . darbepoetin (ARANESP) injection - DIALYSIS  150 mcg Intravenous Q Mon-HD  . feeding supplement (PRO-STAT SUGAR FREE 64)  60 mL Per Tube BID  . FLUoxetine  20 mg Per Tube Daily  . free water  100 mL Per Tube Q4H  . insulin aspart  0-20 Units Subcutaneous Q4H  . insulin aspart  7 Units Subcutaneous Q4H  . insulin detemir  40 Units Subcutaneous BID  . mouth rinse  15 mL Mouth Rinse q12n4p  . midodrine  10 mg Per Tube Q8H  .  neomycin-bacitracin-polymyxin  1 application Topical Daily  . pantoprazole sodium  40 mg Per Tube Q1200  . polyethylene glycol  17 g Per Tube Daily  . QUEtiapine  25 mg Oral BID  . sodium chloride flush  10-40 mL Intracatheter Q12H  . warfarin  3 mg Oral ONCE-1600  . Warfarin - Pharmacist Dosing Inpatient   Does not apply q1600   Elmarie Shiley, MD 06/27/2019, 6:27 AM

## 2019-06-27 NOTE — Progress Notes (Signed)
NAME:  Carlos Brewer., MRN:  [id number], DOB:  03/25/1985, LOS: 72 ADMISSION DATE:  04/20/2019, CONSULTATION DATE:  04/26/2019 REFERRING MD:  Dr. Leonel Ramsay, CHIEF COMPLAINT:  Slurred speech  Brief History   34 yo male smoker found to have slurred speech and Rt sided weakness.  Admitted 2/5 with left MCA CVA and multiple smaller embolic infarcts>>>  tPA and thrombectomy in IR.  Additionally found to have left ventricular  thrombus.    Course complicated by septic shock r/t HCAP, AKI requiring CRRT, polymorphic VT and cardiogenic shock.  Intermittent pressor dependence, required tracheostomy Intermittent hemodialysis [place] CVA Heart failure with reduced ejection fraction < 15% on last echocardiogram Recurrent episodes of bilateral infiltrates and mechanical ventilation?  Aspiration pneumonitis  Past Medical History  Systolic CHF with non ischemic CM, Cocaine abuse, OSA, DM Hx of CHF (EF 15%)  Hx myocarditis Smoker 1/2 ppd   Significant Hospital Events   2/05 Admit, tPA, IR thrombectomy 2/6 Shock requiring increasing levophed and phenylephrine.   2/6: switched to levophed, epi, started antibiotics, started on CRRT.  2/9  developed wide-complex tachycardia and hypotension, started on amiodarone drip, increase Levophed drip 2/15 drop in platelets, heparin stopped, bival started 2/17 Hypotensive and back on Levophed 2/18 trach, lines changed 2/19 started milrinone , fever 103 2/20 Episode of wide-complex tachycardia yesterday, changed from Levophed to vasopressin 2/22 stopping vanc. Still on inotrope support. Some AF w/ RVR. Had to be placed back on pressors. ivabradine added 2/23 still on pressors/ CRRT continued 2/24 tmax 98.4, Remains on CRRT, even UF, remains anuric, Levophed at 14 mcg/min, Milrinone at 0.125 mcg/kg/min, Coox 63.4 Doing well this morning on ATC, no events overnight.  Midodrine added   2/25 more interactive, remains on ATC, tmax 99.5/ WBC 21.4, ~900 ml  liquid stool/ 24 hours, neg Cdiff, Levophed up to 20 mcg/min, CVP 2, ,milrinone remains at 0.125 mcg/kg/min, coox 92.7, Off CRRT since last night ~2000 s/p clotted x 3 off citrate, restarted  3/1 Amio drip restarted for WCT/atrial flutter, milrinone turned off 3/2 CRRT stopped, permacath placed 3/5 crrt resumed 3/6 started on pressors for crrt tolerance 3/8 No longer on CRRT or pressor support  3/12 Tolerated PS for >2 hours  3/17  febrile 106 overnight, shock on pressors, new lines placed, broad-spectrum antibiotics 3/22: Awake, appears comfortable on pressure support ventilation. weaning Precedex, he is back on low-dose norepinephrine 3/23 pm back on vent at hs for resp distress / desats ? chf on cxr  3/24 t collar 24/7 3/29 tolerating ATM without difficulty 3/31 tr back to ICU -trach changed to 6 cuffless, bronched, new fever, started on antibiotics, pneumomediastinum small biapical pneumothoraces-stable 4/1 CRRT transient 4/5 started back on HD tiw, pneumothorax, pneumomediastinum resolved. Active tracheal bleeding> Bival held 4/6 reduced amiodarone to 200 mg daily, bival resumed 4/7 Off levo, tolerating trach collar.  4/9 transfer out of ICU 4/10 desaturations, too "unastable" for HD from respiratory standpoint 4/11 send back to ICU for CRRT  Consults:  Neuro IR Cardiology  Nephrology Heart Failure  EP   Procedures:  2/5-2/6:  TPA given at 428 am (total of 90 Mg) 520 am went to IR  S/P Lt common carotid arteriogram followed by complete revascularization of occluded LT MCA sup division mid M2 seg with x 1 pass with 42mx 40 mm solitaire X ret river device and penumbra aspiration with TICI 3 revascularization  ETT 2/05 >> 2/18 [facility] CVL 2/5 >>2/18  RT [place] CVL 2/18 >>3/2 RIJ  HD cath 2/6 >>2/18 6 shiley cuffed trach 2/18  >>3/4  RT fem HD 2/19 >>3/2 RIJ permacath 3/2>> 3/4 shiley 4 uncuffed.... dislodged placed back 6cuffed shiley that evening 3/5: change to #6 cuffed distal  XLT  3/17 right femoral CVL >> 3/23 3/17 right femoral arterial line >>3/23 3/31 bronchoscopy : airway clear, conclusion = end of trach up against the wall obstructing airflow   3/31 trach change to cuffed #6 XLT  4/7 PEG by IR  Significant Diagnostic Tests:  CT angio head/neck 2/05 >> occlusion of Lt MCA bifurcation Echo 2/05 >> EF less than 20%, cannot rule out apical thrombus MRI brain 2/11 > extensive acute infarction of multiple areas without large or medium vessel occlusion Echocardiogram 2/8 Left ventricular ejection fraction, by estimation, is <20%. The left  ventricle has severely decreased function. The left ventricle demonstrates  global hypokinesis. The left ventricular internal cavity size was severely  dilated. Possible small 0.8 x 0.6 cm apical thrombus.   3/16 duplex upper and lower extremities >>neg  3/17 head CT >.  New area of hypoattenuation in the left pons , left frontal encephalomalacia related to old infarct 3/31CT chestreveals extensive pneumomediastinum and subcutaneous emphysema in the lower neck.  There is less than 5% bilateral pneumothoraces.  Extensive airspace disease right greater than left consistent with ARDS or infection  3/31 head CT >> neg for new pathology Micro Data:  SARS CoV2 PCR 2/05 >> negative Influenza PCR 2/05 >> negative ... Cdiff 2/25 >> neg Trach asp 3/11>> Few candida albicans BC 3/11>> 1 of 4 with Coag neg staph  ...  trach 3/24 >  no  wbc, no organisms final - nl flora  Bronchoscopy 3/31  moderate GPC, normal respiratory flora Blood cultures 3/31 Neg   resp 4/11 >> Blood 4/12 >>  Antimicrobials:  mero 2/6 -2/12 Zosyn 2/6 , 2/19 >> 2/23 vanc 2/6 >> 2/9 , 2/19 >>2/22 Ceftriaxone 3/12> 3/16 3/17 vanc >> 3/19 3/17 Anidulafungin >> 3/19 3/17 cefepime >>  3/22  3/31 vancomycin>>4/3 3/31 cefepime>>4/3 Unasyn 4/3>>4/4  Interim history/subjective:   Critically ill, on CRRT Low-grade febrile 100.9 on 4/11, defervesced  after   Objective   Blood pressure (!) 82/60, pulse 90, temperature 97.9 F (36.6 C), temperature source Axillary, resp. rate (!) 35, height 6' 1"  (1.854 m), weight 106.2 kg, SpO2 100 %.    FiO2 (%):  [40 %-60 %] 60 %   Intake/Output Summary (Last 24 hours) at 06/27/2019 1142 Last data filed at 06/27/2019 1113 Gross per 24 hour  Intake 2931.84 ml  Output 3532 ml  Net -600.16 ml   Filed Weights   06/25/19 0338 06/26/19 0405 06/27/19 0415  Weight: 105 kg 114.6 kg 106.2 kg        Physical Exam. GEN: Young man lying in bed , on CRRT, mild distress HEENT: Trach in place, minimal bloody secretions CV: Tachycardic, ext warm PULM: Shallow rapid inspiratory efforts followed by periods of apnea with accessory muscle use-appears to be periodic breathing EXT: trace edema NEURO: he looks around but does not follow commands PSYCH: RASS 0 SKIN: No rashes   Chest x-ray 4/11 personally reviewed which shows diffuse bilateral airspace disease Labs show decreased hyperkalemia, decreasing creatinine on CRRT, rising procalcitonin, stable mild leukocytosis   Resolved Hospital Problem list   HCAP 7/34, Acute metabolic encephalopathy 2nd to hypoxia and renal failure, Cardiogenic shock HIT  Mixed Cardiogenic Septic shock, present on admission Pneumomediastinum, pneumothoraces> resolved by pCXR 4/5   Assessment &  Plan:   Acute on chronic respiratory failure- Cheyne stokes breathing resulting in desaturations (from CHF and stroke).  Hypoxia appears to be related to volume overload rather than aspiration  -Continue trach collar trials -Only reason to put him back on the vent would be worsening hypoxia  Tracheostomy with intermittent bleeding- improved  Hypotension -continue midodrine, can add low-dose Levophed to enable volume removal if needed  End stage heart failure- CXR looks more volume overloaded  End stage renal failure-intolerant of regular HD -Continue CRRT with volume removal if  blood pressure will permit  HAP -with fever on 4/11, chills noted on exam today, rising procalcitonin, start empiric meropenem and vancomycin and obtain blood and respiratory culture  HIT- ct coumadin with bivalirudin bridge until therapeutic INR  Uncontrolled diabetes type 2 -better controlled with SSI Levemir 40 units twice daily Tube feed coverage 7 units every 4 hours   Summary-prognosis dismal here long-term.  Short-term trying CRRT for volume removal but we are reaching a point where we may have to limit resources and think about not offering any more CRRT here. Adding empiric antibiotics today  The patient is critically ill with multiple organ systems failure and requires high complexity decision making for assessment and support, frequent evaluation and titration of therapies, application of advanced monitoring technologies and extensive interpretation of multiple databases. Critical Care Time devoted to patient care services described in this note independent of APP/resident  time is 32 minutes.   [name] MD. [place] Flood. [facility]  If no response to pager , please call [phone]   06/27/2019

## 2019-06-28 DIAGNOSIS — N179 Acute kidney failure, unspecified: Secondary | ICD-10-CM | POA: Diagnosis not present

## 2019-06-28 DIAGNOSIS — J9601 Acute respiratory failure with hypoxia: Secondary | ICD-10-CM | POA: Diagnosis not present

## 2019-06-28 DIAGNOSIS — Z930 Tracheostomy status: Secondary | ICD-10-CM | POA: Diagnosis not present

## 2019-06-28 LAB — RENAL FUNCTION PANEL
Albumin: 2.3 g/dL — ABNORMAL LOW (ref 3.5–5.0)
Albumin: 2.5 g/dL — ABNORMAL LOW (ref 3.5–5.0)
Anion gap: 16 — ABNORMAL HIGH (ref 5–15)
Anion gap: 17 — ABNORMAL HIGH (ref 5–15)
BUN: 41 mg/dL — ABNORMAL HIGH (ref 6–20)
BUN: 42 mg/dL — ABNORMAL HIGH (ref 6–20)
CO2: 22 mmol/L (ref 22–32)
CO2: 21 mmol/L — ABNORMAL LOW (ref 22–32)
Calcium: 9.2 mg/dL (ref 8.9–10.3)
Calcium: 9 mg/dL (ref 8.9–10.3)
Chloride: 99 mmol/L (ref 98–111)
Chloride: 99 mmol/L (ref 98–111)
Creatinine, Ser: 1.51 mg/dL — ABNORMAL HIGH (ref 0.61–1.24)
Creatinine, Ser: 1.44 mg/dL — ABNORMAL HIGH (ref 0.61–1.24)
GFR calc Af Amer: >60 mL/min (ref >60)
GFR calc Af Amer: >60 mL/min (ref >60)
GFR calc non Af Amer: 59 mL/min — ABNORMAL LOW (ref >60)
GFR calc non Af Amer: >60 mL/min (ref >60)
Glucose, Bld: 96 mg/dL (ref 70–99)
Glucose, Bld: 99 mg/dL (ref 70–99)
Phosphorus: 2.7 mg/dL (ref 2.5–4.6)
Phosphorus: 2.8 mg/dL (ref 2.5–4.6)
Potassium: 4.7 mmol/L (ref 3.5–5.1)
Potassium: 4.6 mmol/L (ref 3.5–5.1)
Sodium: 137 mmol/L (ref 135–145)
Sodium: 137 mmol/L (ref 135–145)

## 2019-06-28 LAB — CBC
HCT: 32.2 % — ABNORMAL LOW (ref 39.0–52.0)
Hemoglobin: 9.2 g/dL — ABNORMAL LOW (ref 13.0–17.0)
MCH: 30.7 pg (ref 26.0–34.0)
MCHC: 28.6 g/dL — ABNORMAL LOW (ref 30.0–36.0)
MCV: 107.3 fL — ABNORMAL HIGH (ref 80.0–100.0)
Platelets: 298 10*3/uL (ref 150–400)
RBC: 3 MIL/uL — ABNORMAL LOW (ref 4.22–5.81)
RDW: 18.9 % — ABNORMAL HIGH (ref 11.5–15.5)
WBC: 13.5 10*3/uL — ABNORMAL HIGH (ref 4.0–10.5)
nRBC: 1.3 % — ABNORMAL HIGH (ref 0.0–0.2)

## 2019-06-28 LAB — CULTURE, RESPIRATORY W GRAM STAIN: Culture: NORMAL

## 2019-06-28 LAB — PROTIME-INR
INR: 1.6 — ABNORMAL HIGH (ref 0.8–1.2)
Prothrombin Time: 19.1 seconds — ABNORMAL HIGH (ref 11.4–15.2)

## 2019-06-28 LAB — GLUCOSE, CAPILLARY
Glucose-Capillary: 114 mg/dL — ABNORMAL HIGH (ref 70–99)
Glucose-Capillary: 128 mg/dL — ABNORMAL HIGH (ref 70–99)
Glucose-Capillary: 87 mg/dL (ref 70–99)
Glucose-Capillary: 96 mg/dL (ref 70–99)
Glucose-Capillary: 99 mg/dL (ref 70–99)

## 2019-06-28 LAB — APTT
aPTT: 30 seconds (ref 24–36)
aPTT: >200 seconds (ref 24–36)
aPTT: 29 seconds (ref 24–36)
aPTT: 39 seconds — ABNORMAL HIGH (ref 24–36)

## 2019-06-28 LAB — MAGNESIUM: Magnesium: 2.4 mg/dL (ref 1.7–2.4)

## 2019-06-28 LAB — PROCALCITONIN: Procalcitonin: 3.19 ng/mL

## 2019-06-28 MED ORDER — INSULIN DETEMIR 100 UNIT/ML ~~LOC~~ SOLN
30.0000 [IU] | Freq: Two times a day (BID) | SUBCUTANEOUS | Status: DC
  Administered 2019-06-28 – 2019-06-29 (×3): 30 [IU] via SUBCUTANEOUS
  Filled 2019-06-28 (×4): qty 0.3

## 2019-06-28 MED ORDER — CHLORHEXIDINE GLUCONATE 0.12% ORAL RINSE (MEDLINE KIT)
15.0000 mL | Freq: Two times a day (BID) | OROMUCOSAL | Status: DC
  Administered 2019-06-29 – 2019-07-09 (×20): 15 mL via OROMUCOSAL

## 2019-06-28 MED ORDER — WARFARIN SODIUM 5 MG PO TABS
5.0000 mg | ORAL_TABLET | Freq: Once | ORAL | Status: AC
  Administered 2019-06-28: 5 mg via ORAL
  Filled 2019-06-28: qty 1

## 2019-06-28 MED ORDER — QUETIAPINE FUMARATE 50 MG PO TABS
25.0000 mg | ORAL_TABLET | Freq: Two times a day (BID) | ORAL | Status: DC
  Administered 2019-06-28 – 2019-06-29 (×2): 25 mg
  Filled 2019-06-28 (×2): qty 1

## 2019-06-28 MED ORDER — ORAL CARE MOUTH RINSE
15.0000 mL | OROMUCOSAL | Status: DC
  Administered 2019-06-29 – 2019-07-09 (×96): 15 mL via OROMUCOSAL

## 2019-06-28 MED ORDER — DARBEPOETIN ALFA 150 MCG/0.3ML IJ SOSY
150.0000 ug | PREFILLED_SYRINGE | INTRAMUSCULAR | Status: DC
  Administered 2019-07-04: 17:00:00 150 ug via INTRAVENOUS
  Filled 2019-06-28: qty 0.3

## 2019-06-28 MED ORDER — SODIUM CHLORIDE 0.9 % IV SOLN
0.0979 mg/kg/h | INTRAVENOUS | Status: DC
  Filled 2019-06-28: qty 250

## 2019-06-28 NOTE — Progress Notes (Signed)
RT NOTES: Patient due for trach tube change. CCM wants to change trach and requested it be done tomorrow 06/29/19.

## 2019-06-28 NOTE — Progress Notes (Signed)
  Speech Language Pathology Treatment: Carlos Brewer Speaking valve  Patient Details Name: Carlos Brewer. MRN: [id number] DOB: [birth year] Today's Date: 06/28/2019 Time: 6644-0347 SLP Time Calculation (min) (ACUTE ONLY): 10 min  Assessment / Plan / Recommendation Clinical Impression  Pt more alert than when last seen by SLP. Now on 68M and requiring CRRT.  Cuff deflated at baseline; tolerating TC; RR high and increased WOB evident.  RN reports Cheyne-Stokes respiratory pattern.  PMV placed for limited intervals of 3-4 breath cycles. Pt making good eye contact, voicing when cued and when PMV in place.  Did not achieve other verbalizations (counting, name, automatic sequences) despite modeling and visual/verbal cues.  Ceased trials given rapid RR.  Recommend valve be placed at least for brief intervals when family is present to encourage oral communication.  SLP will continue to follow for PMV, aphasia, and swallowing as appropriate.    HPI HPI: Pt is a 34 y/o male smoker who initially presented on 2/5 with slurred speech and Rt sided weakness. Admitted with left MCA CVA and multiple smaller embolic infarcts s/p tPA and thrombectomy in IR.  CT head 3/31: subacute to chronic infarctions of the frontal lobes, right cerebellar hemisphere, and pons.  Additionally found to have left ventricular  thrombus. Hospital course complicated by septic shock r/t HCAP, AKI requiring CRRT, polymorphic VT and cardiogenic shock. Echo EF < 20%. Pt with Intermittent pressor dependence, required tracheostomy 2/18.  Has been in and out of ICU and has required intermittent vent support. G-tube placed 06/23/19. Sent back to ICU 4/11 for CRRT.       SLP Plan  Continue with current plan of care       Recommendations         Patient may use Passy-Muir Speech Valve: Intermittently with supervision PMSV Supervision: Full         Oral Care Recommendations: Oral care QID SLP Visit Diagnosis: Aphonia (R49.1) Plan:  Continue with current plan of care       GO                Carlos Brewer 06/28/2019, 10:02 AM Estill Bamberg L. [name], [place] Office number [phone] Pager [phone]

## 2019-06-28 NOTE — Progress Notes (Signed)
ANTICOAGULATION CONSULT NOTE  Pharmacy Consult:  Bivalirudin and Warfarin Indication: stroke 2/5, LV thrombus 2/8, HIT 2/15  Allergies  Allergen Reactions  . Heparin Other (See Comments)    Heparin induced thrombocytopenia. 2/15 HIT OD 1.692. 2/16 SRA positive-90.     Patient Measurements: Height: 6\' 1"  (185.4 cm) Weight: 104.3 kg (229 lb 15 oz) IBW/kg (Calculated) : 79.9   Vital Signs: Temp: 97.6 F (36.4 C) (04/13 0711) Temp Source: Axillary (04/13 0711) BP: 105/71 (04/13 0800) Pulse Rate: 89 (04/13 0800)  Labs: Recent Labs    06/26/19 0458 06/26/19 0458 06/26/19 0926 06/26/19 2011 06/27/19 0538 06/27/19 1545 06/28/19 0518  HGB 9.1*   < >  --   --  8.2*  --  9.2*  HCT 30.5*  --   --   --  28.1*  --  32.2*  PLT 265  --   --   --  259  --  298  APTT 41*   < > 70*  --  71*  --  30  LABPROT 19.0*  --   --   --  21.2*  --  19.1*  INR 1.6*  --   --   --  1.8*  --  1.6*  CREATININE  --   --   --    < > 2.17* 1.76* 1.51*   < > = values in this interval not displayed.    Estimated Creatinine Clearance: 86.6 mL/min (A) (by C-G formula based on SCr of 1.51 mg/dL (H)).  Assessment: 35 yr old male with history of ESRD on HD presented with left MCA infarct - received TPA and revascularization with IR on 2/5. On 2/8 found to have small LV apical thrombus on ECHO. Pharmacy consulted to dose IV heparin, which was switched to bivalirudin 2/15 for HIT. 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 SRA very positive at 2. Heparin allergy has appropriately been added to chart and updated with SRA results. Transitioned off bivalrudin on 3/14. Restarted bivalirudin 3/26 and briefly held on 3/31 for chest tube placement.   Bivalirudin was held on 4/5 due to significant bleeding from trach and patient was intended to receive PEG on 4/6; however procedure was delayed and bivalirudin resumed targeting lower end of therapeutic range due to trach bleeding.  Bivalirudin held again 4/8 AM for PEG procedure and resumed after PEG placement on 4/8 PM. Pharmacy has been consulted to resume warfarin now that PEG has been placed and continue bivalirudin until INR >3 and then recheck INR and aPTT 4-6 hours later  4/13: First aPTT can back subtherapeutic at 30, unsure if this level was drawn correctly since last two daily levels have been therapeutic, requested a STAT aPTT which can back supratherapeutic at >200 (lab drawn directly from IV site), requested another STAT aPTT which confirmed level was subtherapeutic at 29 (draw from opposite arm containing IV)  INR remains subtherapeutic at 1.6. CBC stable.   Goal of Therapy:  aPTT 50-85 seconds INR 2-3 Monitor platelets by anticoagulation protocol: Yes   Plan:  Increase bivalirudin to 0.0816 mg/kg/hr Check 2hr aPTT Warfarin 5 mg x 1 tonight Monitor bleeding from trach site  Monitor daily aPTT, CBC, INR  Lorel Monaco, PharmD PGY1 Ambulatory Care Resident [employer] # [phone]  Please check [email or web address] for unit-specific pharmacist phone numbers

## 2019-06-28 NOTE — Progress Notes (Signed)
ANTICOAGULATION CONSULT NOTE  Pharmacy Consult:  Bivalirudin and Warfarin Indication: stroke 2/5, LV thrombus 2/8, HIT 2/15  Allergies  Allergen Reactions  . Heparin Other (See Comments)    Heparin induced thrombocytopenia. 2/15 HIT OD 1.692. 2/16 SRA positive-90.     Patient Measurements: Height: 6\' 1"  (185.4 cm) Weight: 104.3 kg (229 lb 15 oz) IBW/kg (Calculated) : 79.9   Vital Signs: Temp: 97.5 F (36.4 C) (04/13 1530) Temp Source: Axillary (04/13 1530) BP: 99/79 (04/13 1545) Pulse Rate: 88 (04/13 1545)  Labs: Recent Labs    06/26/19 0458 06/26/19 0926 06/27/19 0538 06/27/19 0538 06/27/19 1545 06/28/19 0518 06/28/19 0518 06/28/19 0816 06/28/19 0957 06/28/19 1437 06/28/19 1513  HGB 9.1*  --  8.2*  --   --  9.2*  --   --   --   --   --   HCT 30.5*  --  28.1*  --   --  32.2*  --   --   --   --   --   PLT 265  --  259  --   --  298  --   --   --   --   --   APTT 41*   < > 71*   < >  --  30   < > >200* 29  --  39*  LABPROT 19.0*  --  21.2*  --   --  19.1*  --   --   --   --   --   INR 1.6*  --  1.8*  --   --  1.6*  --   --   --   --   --   CREATININE  --    < > 2.17*   < > 1.76* 1.51*  --   --   --  1.44*  --    < > = values in this interval not displayed.    Estimated Creatinine Clearance: 90.8 mL/min (A) (by C-G formula based on SCr of 1.44 mg/dL (H)).  Assessment: 34 yr old male with history of ESRD on HD presented with left MCA infarct - received TPA and revascularization with IR on 2/5. On 2/8 found to have small LV apical thrombus on ECHO. Pharmacy consulted to dose IV heparin, which was switched to bivalirudin 2/15 for HIT. 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 SRA very positive at 80. Heparin allergy has appropriately been added to chart and updated with SRA results. Transitioned off bivalrudin on 3/14. Restarted bivalirudin 3/26 and briefly held on 3/31 for chest tube placement.   Bivalirudin was held on 4/5 due to  significant bleeding from trach and patient was intended to receive PEG on 4/6; however procedure was delayed and bivalirudin resumed targeting lower end of therapeutic range due to trach bleeding. Bivalirudin held again 4/8 AM for PEG procedure and resumed after PEG placement on 4/8 PM. Pharmacy has been consulted to resume warfarin now that PEG has been placed and continue bivalirudin until INR >3 and then recheck INR and aPTT 4-6 hours later  4/13 PM: aPTT is 39 which is up some after increase to 0.0816 mg/kg/hr but still SUB-therapeutic for goal of 50-85. Patient remains on CRRT and patient has required almost daily increases in dose since the initiation of CRRT on 4/11. Patient is a VERY difficult stick due to extensive scarring after being stuck so many times with this prologed admission and is currently adamantly refusing any further sticks  today. No overt bleeding this PM per the RN and CBC is stable. Patient has received his dose of Warfarin this PM. Will go ahead and increase drip rate but will wait until AM to recheck a level.  Goal of Therapy:  aPTT 50-85 seconds INR 2-3 Monitor platelets by anticoagulation protocol: Yes   Plan:  Increase bivalirudin to 0.979 mg/kg/hr (20% increase per protocol). Monitor daily aPTT, CBC, INR RN to call if any concerns.   Sloan Leiter, PharmD, BCPS, BCCCP Clinical Pharmacist Please refer to [facility] for [place] numbers 06/28/2019, 4:06 PM

## 2019-06-28 NOTE — Progress Notes (Signed)
NAME:  Carlos Brewer., MRN:  [id number], DOB:  08-13-1985, LOS: 41 ADMISSION DATE:  05/02/2019, CONSULTATION DATE:  04/21/2019 REFERRING MD:  Dr. Leonel Ramsay, CHIEF COMPLAINT:  Slurred speech  Brief History   34 yo male smoker found to have slurred speech and Rt sided weakness.  Admitted 2/5 with left MCA CVA and multiple smaller embolic infarcts>>>  tPA and thrombectomy in IR.  Additionally found to have left ventricular  thrombus.    Course complicated by septic shock r/t HCAP, AKI requiring CRRT, polymorphic VT and cardiogenic shock.  Intermittent pressor dependence, required tracheostomy Intermittent hemodialysis [place] CVA Heart failure with reduced ejection fraction < 15% on last echocardiogram Recurrent episodes of bilateral infiltrates and mechanical ventilation?  Aspiration pneumonitis  Past Medical History  Systolic CHF with non ischemic CM, Cocaine abuse, OSA, DM Hx of CHF (EF 15%)  Hx myocarditis Smoker 1/2 ppd   Significant Hospital Events   2/05 Admit, tPA, IR thrombectomy 2/6 Shock requiring increasing levophed and phenylephrine.   2/6: switched to levophed, epi, started antibiotics, started on CRRT.  2/9  developed wide-complex tachycardia and hypotension, started on amiodarone drip, increase Levophed drip 2/15 drop in platelets, heparin stopped, bival started 2/17 Hypotensive and back on Levophed 2/18 trach, lines changed 2/19 started milrinone , fever 103 2/20 Episode of wide-complex tachycardia yesterday, changed from Levophed to vasopressin 2/22 stopping vanc. Still on inotrope support. Some AF w/ RVR. Had to be placed back on pressors. ivabradine added 2/23 still on pressors/ CRRT continued 2/24 tmax 98.4, Remains on CRRT, even UF, remains anuric, Levophed at 14 mcg/min, Milrinone at 0.125 mcg/kg/min, Coox 63.4 Doing well this morning on ATC, no events overnight.  Midodrine added   2/25 more interactive, remains on ATC, tmax 99.5/ WBC 21.4, ~900 ml  liquid stool/ 24 hours, neg Cdiff, Levophed up to 20 mcg/min, CVP 2, ,milrinone remains at 0.125 mcg/kg/min, coox 92.7, Off CRRT since last night ~2000 s/p clotted x 3 off citrate, restarted  3/1 Amio drip restarted for WCT/atrial flutter, milrinone turned off 3/2 CRRT stopped, permacath placed 3/5 crrt resumed 3/6 started on pressors for crrt tolerance 3/8 No longer on CRRT or pressor support  3/12 Tolerated PS for >2 hours  3/17  febrile 106 overnight, shock on pressors, new lines placed, broad-spectrum antibiotics 3/22: Awake, appears comfortable on pressure support ventilation. weaning Precedex, he is back on low-dose norepinephrine 3/23 pm back on vent at hs for resp distress / desats ? chf on cxr  3/24 t collar 24/7 3/29 tolerating ATM without difficulty 3/31 tr back to ICU -trach changed to 6 cuffless, bronched, new fever, started on antibiotics, pneumomediastinum small biapical pneumothoraces-stable 4/1 CRRT transient 4/5 started back on HD tiw, pneumothorax, pneumomediastinum resolved. Active tracheal bleeding> Bival held 4/6 reduced amiodarone to 200 mg daily, bival resumed 4/7 Off levo, tolerating trach collar.  4/9 transfer out of ICU 4/10 desaturations, too "unastable" for HD from respiratory standpoint 4/11 send back to ICU for CRRT  Consults:  Neuro IR Cardiology  Nephrology Heart Failure  EP   Procedures:  2/5-2/6:  TPA given at 428 am (total of 90 Mg) 520 am went to IR  S/P Lt common carotid arteriogram followed by complete revascularization of occluded LT MCA sup division mid M2 seg with x 1 pass with 61mx 40 mm solitaire X ret river device and penumbra aspiration with TICI 3 revascularization  ETT 2/05 >> 2/18 LIJ CVL 2/5 >>2/18  RT [place] CVL 2/18 >>3/2 RIJ  HD cath 2/6 >>2/18 6 shiley cuffed trach 2/18  >>3/4  RT fem HD 2/19 >>3/2 RIJ permacath 3/2>> 3/4 shiley 4 uncuffed.... dislodged placed back 6cuffed shiley that evening 3/5: change to #6 cuffed distal  XLT  3/17 right femoral CVL >> 3/23 3/17 right femoral arterial line >>3/23 3/31 bronchoscopy : airway clear, conclusion = end of trach up against the wall obstructing airflow   3/31 trach change to cuffed #6 XLT  4/7 PEG by IR  Significant Diagnostic Tests:  CT angio head/neck 2/05 >> occlusion of Lt MCA bifurcation Echo 2/05 >> EF less than 20%, cannot rule out apical thrombus MRI brain 2/11 > extensive acute infarction of multiple areas without large or medium vessel occlusion Echocardiogram 2/8 Left ventricular ejection fraction, by estimation, is <20%. The left  ventricle has severely decreased function. The left ventricle demonstrates  global hypokinesis. The left ventricular internal cavity size was severely  dilated. Possible small 0.8 x 0.6 cm apical thrombus.   3/16 duplex upper and lower extremities >>neg  3/17 head CT >.  New area of hypoattenuation in the left pons , left frontal encephalomalacia related to old infarct 3/31CT chestreveals extensive pneumomediastinum and subcutaneous emphysema in the lower neck.  There is less than 5% bilateral pneumothoraces.  Extensive airspace disease right greater than left consistent with ARDS or infection  3/31 head CT >> neg for new pathology Micro Data:  SARS CoV2 PCR 2/05 >> negative Influenza PCR 2/05 >> negative ... Cdiff 2/25 >> neg Trach asp 3/11>> Few candida albicans BC 3/11>> 1 of 4 with Coag neg staph  ...  trach 3/24 >  no  wbc, no organisms final - nl flora  Bronchoscopy 3/31  moderate GPC, normal respiratory flora Blood cultures 3/31 Neg   resp 4/11 >> nml flora Blood 4/12 >> ng  Antimicrobials:  mero 2/6 -2/12 Zosyn 2/6 , 2/19 >> 2/23 vanc 2/6 >> 2/9 , 2/19 >>2/22 Ceftriaxone 3/12> 3/16 3/17 vanc >> 3/19 3/17 Anidulafungin >> 3/19 3/17 cefepime >>  3/22  3/31 vancomycin>>4/3 3/31 cefepime>>4/3 Unasyn 4/3>>4/4  4/12 meropenem >> 4/12 vanc >>  Interim history/subjective:   Remains on CRRT,  critically ill On trach collar Afebrile Intermittent junctional bradycardia noted   Objective   Blood pressure (!) 84/60, pulse 89, temperature 97.6 F (36.4 C), temperature source Axillary, resp. rate (!) 27, height 6' 1"  (1.854 m), weight 104.3 kg, SpO2 100 %.    FiO2 (%):  [40 %-60 %] 60 %   Intake/Output Summary (Last 24 hours) at 06/28/2019 1045 Last data filed at 06/28/2019 1000 Gross per 24 hour  Intake 3513.06 ml  Output 4633 ml  Net -1119.94 ml   Filed Weights   06/26/19 0405 06/27/19 0415 06/28/19 0515  Weight: 114.6 kg 106.2 kg 104.3 kg        Physical Exam. GEN: Young man sitting up in bed , on CRRT, no distress, mild shivering HEENT: Trach in place, minimal bloody secretions CV: S1-S2 regular PULM: Decreased periodic breathing, no accessory muscle use EXT: trace edema NEURO: Follows one-step commands PSYCH: RASS 0 SKIN: No rashes   Chest x-ray 4/11 personally reviewed which shows diffuse bilateral airspace disease Labs show decreased hyperkalemia, decreasing creatinine on CRRT, decreasing procalcitonin, stable mild leukocytosis   Resolved Hospital Problem list   HCAP 7/34, Acute metabolic encephalopathy 2nd to hypoxia and renal failure, Cardiogenic shock HIT  Mixed Cardiogenic Septic shock, present on admission Pneumomediastinum, pneumothoraces> resolved by pCXR 4/5   Assessment & Plan:  Acute on chronic respiratory failure- Cheyne stokes breathing resulting in desaturations (from CHF and stroke).  Hypoxia appears to be related to volume overload rather than aspiration  -Continue trach collar trials   Tracheostomy with intermittent bleeding- improved  Hypotension -continue midodrine, ct  low-dose Levophed to enable volume removal   End stage cardiomyopathy/chronic systolic heart failure -Unable to add meds due to soft blood pressure -We will DC amiodarone due to bradycardic episodes, discussed with CHF service  End stage renal  failure-intolerant of regular HD -Continue CRRT -prescription changed by renal due to hyperkalemia  HAP -with fever on 4/11, on empiric meropenem and vancomycin -we will DC on 4/14 if cultures remain negative  HIT- ct coumadin with bivalirudin bridge until therapeutic INR  Uncontrolled diabetes type 2 -better controlled with SSI Decrease Levemir 30 units twice daily Tube feed coverage 7 units every 4 hours -hold if CBG is low   Summary-prognosis dismal here long-term.  Short-term trying CRRT for volume removal  Ok but I do think we have to discuss renal goals of care with patient's mother  The patient is critically ill with multiple organ systems failure and requires high complexity decision making for assessment and support, frequent evaluation and titration of therapies, application of advanced monitoring technologies and extensive interpretation of multiple databases. Critical Care Time devoted to patient care services described in this note independent of APP/resident  time is 31 minutes.     [name] MD. [place] [place]. [facility]  If no response to pager , please call [phone]   06/28/2019

## 2019-06-28 NOTE — Progress Notes (Signed)
Patient ID: Carlos Brewer., male   DOB: [birth year], 34 y.o.   MRN: [id number] [facility] Progress Note   Assessment/ Plan:   1. End stage renal disease following prolonged dialysis dependent AKI: multifactorial etiology including ATN following cardiogenic/septic shock; oliguric (200 cc urine output overnight) but without clear evidence of renal recovery.  I will continue CRRT at this time for efforts at continued volume unloading to help optimize respiratory status; hemodynamic instability has been challenging to consistent transition to intermittent hemodialysis.  CRRT prescription changed yesterday for management of persistent hyperkalemia (which is of unclear origin but raises concern for tissue ischemia/necrosis).  His long term prognosis remains poor and I would recommend continuing CRRT for another 24 hours before revisiting goals of care. 2. Acute stroke: Likely embolic with bilateral infarcts and LV thrombus.  Has residual encephalopathy.  Gastric tube placed by IR on 4/8.  He has evidence of HIT and is on bivalirudin. 3. Hypoxic respiratory failure: prolonged ventilator dependent, has tracheostomy (with intermittent bleeding that has improved).    With trach collar trials. 4. Shock: On midodrine and appears to be tolerating CRRT/UF- levophed currently on hold. 5. Anemia: Continue ESA.  No Iron because of concern of infection.  Transfuse as needed. 6. CKD-MBD: Calcium and phosphorus levels at goal, PTH 70.  Subjective:   Afternoon labs from yesterday afternoon showed hyperkalemia for which CRRT prescription was modified.  Transiently on pressors overnight.  Objective:   BP (!) 77/51   Pulse 87   Temp 97.7 F (36.5 C) (Axillary)   Resp (!) 46   Ht 6' 1"  (1.854 m)   Wt 104.3 kg   SpO2 90%   BMI 30.34 kg/m   Intake/Output Summary (Last 24 hours) at 06/28/2019 0612 Last data filed at 06/28/2019 0600 Gross per 24 hour  Intake 3493.89 ml  Output 4694 ml  Net  -1200.11 ml   Weight change: -1.9 kg  Physical Exam: HAL:PFXTKWI comfortable on ventilator via trach OXB:DZHGD regular rhythm and normal rate. s1 and s2 normal. RIJ TDC connected to CRRT Resp:Anteriorly coarse breath sounds bilaterally, no rales/rhonchi JME:QAST, obese, non-tender, PEG tube in situ Ext: No LE edema bilaterally  Imaging: DG Chest Port 1V same Day  Result Date: 06/26/2019 CLINICAL DATA:  Respiratory distress.  Cerebral vascular accident. EXAM: PORTABLE CHEST 1 VIEW COMPARISON:  06/25/2019 FINDINGS: Tracheostomy tube central venous line unchanged. Stable enlarged cardiac silhouette. Diffuse airspace disease unchanged from prior. Greatest density in the RIGHT lower lobe. No pneumothorax. IMPRESSION: 1. No interval change.  Stable support apparatus. 2. Diffuse bilateral severe airspace disease. Electronically Signed   By: Suzy Bouchard M.D.   On: 06/26/2019 09:17    Labs: BMET Recent Labs  Lab 06/24/19 1504 06/25/19 0233 06/25/19 1451 06/26/19 0451 06/26/19 2011 06/27/19 0538 06/27/19 1545  NA 138 134* 135 137 136 137 135  K 3.6 3.9 4.2 5.3* 5.6* 5.0 6.2*  CL 98 98 99 100 100 98 99  CO2 25 24 22  17* 17* 22 21*  GLUCOSE 127* 130* 163* 180* 130* 146* 127*  BUN 26* 38* 47* 56* 54* 47* 45*  CREATININE 2.07* 2.44* 2.92* 3.00* 2.48* 2.17* 1.76*  CALCIUM 8.8* 8.9 8.8* 9.0 9.0 9.4 8.7*  PHOS 2.4* 3.0 3.5 4.2 3.7 3.4 3.4   CBC Recent Labs  Lab 06/25/19 0233 06/26/19 0458 06/27/19 0538 06/28/19 0518  WBC 9.2 14.9* 13.4* 13.5*  HGB 9.0* 9.1* 8.2* 9.2*  HCT 30.1* 30.5* 28.1* 32.2*  MCV 102.7* 105.5*  105.6* 107.3*  PLT 240 265 259 298    Medications:    . amiodarone  200 mg Per Tube Daily  . ARIPiprazole  10 mg Per Tube Daily  . aspirin  81 mg Per Tube Daily  . atorvastatin  40 mg Per Tube q1800  . chlorhexidine  15 mL Mouth Rinse BID  . chlorhexidine gluconate (MEDLINE KIT)  15 mL Mouth Rinse BID  . Chlorhexidine Gluconate Cloth  6 each Topical Q0600  .  darbepoetin (ARANESP) injection - DIALYSIS  150 mcg Intravenous Q Mon-HD  . feeding supplement (PRO-STAT SUGAR FREE 64)  60 mL Per Tube BID  . FLUoxetine  20 mg Per Tube Daily  . free water  100 mL Per Tube Q4H  . insulin aspart  0-20 Units Subcutaneous Q4H  . insulin aspart  7 Units Subcutaneous Q4H  . insulin detemir  40 Units Subcutaneous BID  . mouth rinse  15 mL Mouth Rinse q12n4p  . midodrine  10 mg Per Tube Q8H  . neomycin-bacitracin-polymyxin  1 application Topical Daily  . pantoprazole sodium  40 mg Per Tube Q1200  . polyethylene glycol  17 g Per Tube Daily  . QUEtiapine  25 mg Oral BID  . sodium chloride flush  10-40 mL Intracatheter Q12H  . Warfarin - Pharmacist Dosing Inpatient   Does not apply q1600   Elmarie Shiley, MD 06/28/2019, 6:12 AM

## 2019-06-28 NOTE — Progress Notes (Signed)
Occupational Therapy Treatment Patient Details Name: Carlos Brewer. MRN: [id number] DOB: September 23, 1985 Today's Date: 06/28/2019    History of present illness Pt is a 34 y/o male smoker who initially presented on 2/5 with slurred speech and Rt sided weakness. Admitted with left MCA CVA and multiple smaller embolic infarcts s/p tPA and thrombectomy in IR.  Additionally found to have left ventricular  thrombus. Hospital course complicated by septic shock r/t HCAP, AKI requiring CRRT, polymorphic VT and cardiogenic shock. Echo EF < 20%. Pt with Intermittent pressor dependence, required tracheostomy. Tolerating trach collar 3/4, back on vent 3/5 with flash pulmonary edema.  Started IHD 3/10.  G-tube placed 06/23/19.   OT comments  Pt agreeable to working with OT but notably fatigued this PM, mother present and reports has had busy afternoon with lab attempting blood draw and recently having a wash up; pt also now on CRRT. Pt requiring cues to remain awake/engage with therapist due to fatigue. He tolerated AA/PROM and stretching to bil UEs/LEs within available range. Additional focus on stretching to RUE/RLE as pt with increased stiffness/decreased ROM noted. Mother present and supportive throughout. BP soft but stable, RR fluctuating 20s-low 30s. Will continue per POC at this time.   Follow Up Recommendations  LTACH;Supervision/Assistance - 24 hour    Equipment Recommendations  Other (comment)(TBD)          Precautions / Restrictions Precautions Precautions: Fall Precaution Comments: trach collar, watch BP, PEG, CRRT       Mobility Bed Mobility               General bed mobility comments: bed egressed to modified chair position start of session  Transfers                 General transfer comment: deferred        ADL either performed or assessed with clinical judgement   ADL Overall ADL's : Needs assistance/impaired                                        General ADL Comments: pt remains totalA                       Cognition Arousal/Alertness: Awake/alert Behavior During Therapy: Flat affect Overall Cognitive Status: Impaired/Different from baseline                     Current Attention Level: Sustained;Selective   Following Commands: Follows one step commands with increased time Safety/Judgement: Decreased awareness of safety;Decreased awareness of deficits Awareness: Intellectual Problem Solving: Slow processing;Decreased initiation;Requires verbal cues;Requires tactile cues          Exercises Other Exercises Other Exercises: graded AA/PROM to bil UEs (shoulder, elbow, wrist and digits), stretching to RUE as R shoulder/scapular with increased tightness noted  Other Exercises: AA/PROM to bil LEs including hip/knee flexion/extension, hip abd/addution Other Exercises: stretching to heel cords Other Exercises: stretching to RLE towards midline as pt LE tends to rest in external rotation    Shoulder Instructions       General Comments      Pertinent Vitals/ Pain       Pain Assessment: Faces Faces Pain Scale: No hurt  Home Living  Prior Functioning/Environment              Frequency  Min 2X/week        Progress Toward Goals  OT Goals(current goals can now be found in the care plan section)  Progress towards OT goals: OT to reassess next treatment  Acute Rehab OT Goals Patient Stated Goal: to d/c home with mother after rehab OT Goal Formulation: With patient Time For Goal Achievement: 07/06/19 Potential to Achieve Goals: Fair ADL Goals Pt Will Perform Grooming: sitting;with mod assist Pt Will Perform Upper Body Dressing: sitting;with mod assist Pt/caregiver will Perform Home Exercise Program: Increased ROM;Increased strength;Both right and left upper extremity;With minimal assist;With written HEP provided Additional ADL  Goal #1: Pt will maintain sitting balance EOB >10 min at minA level in prep for ADL. Additional ADL Goal #2: Pt will follow multi step commands with 75% accuracy during functional task completion. Additional ADL Goal #3: Pt will perform bed mobility with modA as precursor to EOB/OOB ADL.  Plan Discharge plan remains appropriate    Co-evaluation                 AM-PAC OT "6 Clicks" Daily Activity     Outcome Measure   Help from another person eating meals?: Total Help from another person taking care of personal grooming?: Total Help from another person toileting, which includes using toliet, bedpan, or urinal?: Total Help from another person bathing (including washing, rinsing, drying)?: Total Help from another person to put on and taking off regular upper body clothing?: Total Help from another person to put on and taking off regular lower body clothing?: Total 6 Click Score: 6    End of Session Equipment Utilized During Treatment: Oxygen  OT Visit Diagnosis: Muscle weakness (generalized) (M62.81);Other abnormalities of gait and mobility (R26.89)   Activity Tolerance Patient tolerated treatment well;Patient limited by fatigue   Patient Left in bed;with call bell/phone within reach;with family/visitor present   Nurse Communication Mobility status        Time: 1530-1555 OT Time Calculation (min): 25 min  Charges: OT General Charges $OT Visit: 1 Visit OT Treatments $Therapeutic Activity: 23-37 mins  Lou Cal, OT Acute Rehabilitation Services Pager [phone] Office [place] 06/28/2019, 5:08 PM

## 2019-06-29 DIAGNOSIS — N179 Acute kidney failure, unspecified: Secondary | ICD-10-CM | POA: Diagnosis not present

## 2019-06-29 DIAGNOSIS — J9601 Acute respiratory failure with hypoxia: Secondary | ICD-10-CM | POA: Diagnosis not present

## 2019-06-29 DIAGNOSIS — Z930 Tracheostomy status: Secondary | ICD-10-CM | POA: Diagnosis not present

## 2019-06-29 LAB — GLUCOSE, CAPILLARY
Glucose-Capillary: 113 mg/dL — ABNORMAL HIGH (ref 70–99)
Glucose-Capillary: 96 mg/dL (ref 70–99)
Glucose-Capillary: 103 mg/dL — ABNORMAL HIGH (ref 70–99)
Glucose-Capillary: 117 mg/dL — ABNORMAL HIGH (ref 70–99)
Glucose-Capillary: 127 mg/dL — ABNORMAL HIGH (ref 70–99)
Glucose-Capillary: 71 mg/dL (ref 70–99)

## 2019-06-29 LAB — RENAL FUNCTION PANEL
Albumin: 2.6 g/dL — ABNORMAL LOW (ref 3.5–5.0)
Albumin: 2.4 g/dL — ABNORMAL LOW (ref 3.5–5.0)
Anion gap: 18 — ABNORMAL HIGH (ref 5–15)
Anion gap: 16 — ABNORMAL HIGH (ref 5–15)
BUN: 41 mg/dL — ABNORMAL HIGH (ref 6–20)
BUN: 42 mg/dL — ABNORMAL HIGH (ref 6–20)
CO2: 20 mmol/L — ABNORMAL LOW (ref 22–32)
CO2: 20 mmol/L — ABNORMAL LOW (ref 22–32)
Calcium: 9.4 mg/dL (ref 8.9–10.3)
Calcium: 9.2 mg/dL (ref 8.9–10.3)
Chloride: 98 mmol/L (ref 98–111)
Chloride: 100 mmol/L (ref 98–111)
Creatinine, Ser: 1.43 mg/dL — ABNORMAL HIGH (ref 0.61–1.24)
Creatinine, Ser: 1.43 mg/dL — ABNORMAL HIGH (ref 0.61–1.24)
GFR calc Af Amer: >60 mL/min (ref >60)
GFR calc Af Amer: >60 mL/min (ref >60)
GFR calc non Af Amer: >60 mL/min (ref >60)
GFR calc non Af Amer: >60 mL/min (ref >60)
Glucose, Bld: 105 mg/dL — ABNORMAL HIGH (ref 70–99)
Glucose, Bld: 104 mg/dL — ABNORMAL HIGH (ref 70–99)
Phosphorus: 2.5 mg/dL (ref 2.5–4.6)
Phosphorus: 2.6 mg/dL (ref 2.5–4.6)
Potassium: 4.5 mmol/L (ref 3.5–5.1)
Potassium: 4.9 mmol/L (ref 3.5–5.1)
Sodium: 136 mmol/L (ref 135–145)
Sodium: 136 mmol/L (ref 135–145)

## 2019-06-29 LAB — CBC
HCT: 32.4 % — ABNORMAL LOW (ref 39.0–52.0)
Hemoglobin: 9.4 g/dL — ABNORMAL LOW (ref 13.0–17.0)
MCH: 30.3 pg (ref 26.0–34.0)
MCHC: 29 g/dL — ABNORMAL LOW (ref 30.0–36.0)
MCV: 104.5 fL — ABNORMAL HIGH (ref 80.0–100.0)
Platelets: 320 10*3/uL (ref 150–400)
RBC: 3.1 MIL/uL — ABNORMAL LOW (ref 4.22–5.81)
RDW: 18.6 % — ABNORMAL HIGH (ref 11.5–15.5)
WBC: 13.4 10*3/uL — ABNORMAL HIGH (ref 4.0–10.5)
nRBC: 2.8 % — ABNORMAL HIGH (ref 0.0–0.2)

## 2019-06-29 LAB — PROTIME-INR
INR: 1.8 — ABNORMAL HIGH (ref 0.8–1.2)
Prothrombin Time: 21 seconds — ABNORMAL HIGH (ref 11.4–15.2)

## 2019-06-29 LAB — MAGNESIUM: Magnesium: 2.6 mg/dL — ABNORMAL HIGH (ref 1.7–2.4)

## 2019-06-29 LAB — APTT: aPTT: 55 seconds — ABNORMAL HIGH (ref 24–36)

## 2019-06-29 MED ORDER — DEXTROSE 50 % IV SOLN
INTRAVENOUS | Status: AC
  Administered 2019-06-30: 25 mL
  Filled 2019-06-29: qty 50

## 2019-06-29 MED ORDER — INSULIN DETEMIR 100 UNIT/ML ~~LOC~~ SOLN
25.0000 [IU] | Freq: Two times a day (BID) | SUBCUTANEOUS | Status: DC
  Administered 2019-06-29 – 2019-06-30 (×2): 25 [IU] via SUBCUTANEOUS
  Filled 2019-06-29 (×5): qty 0.25

## 2019-06-29 MED ORDER — VITAL 1.5 CAL PO LIQD
1000.0000 mL | ORAL | Status: AC
  Administered 2019-06-29 – 2019-07-01 (×2): 1000 mL
  Filled 2019-06-29 (×5): qty 1000

## 2019-06-29 MED ORDER — WARFARIN SODIUM 7.5 MG PO TABS
7.5000 mg | ORAL_TABLET | Freq: Once | ORAL | Status: AC
  Administered 2019-06-29: 16:00:00 7.5 mg via ORAL
  Filled 2019-06-29: qty 1

## 2019-06-29 MED ORDER — WARFARIN SODIUM 5 MG PO TABS: 5.0000 mg | ORAL_TABLET | Freq: Once | ORAL | Status: DC

## 2019-06-29 MED ORDER — QUETIAPINE FUMARATE 50 MG PO TABS
50.0000 mg | ORAL_TABLET | Freq: Every day | ORAL | Status: DC
  Administered 2019-07-01 – 2019-07-09 (×8): 50 mg
  Filled 2019-06-29 (×9): qty 1

## 2019-06-29 MED ORDER — MIDAZOLAM HCL 2 MG/2ML IJ SOLN
2.0000 mg | Freq: Once | INTRAMUSCULAR | Status: AC
  Administered 2019-06-29: 10:00:00 2 mg via INTRAVENOUS
  Filled 2019-06-29: qty 2

## 2019-06-29 MED ORDER — PRO-STAT SUGAR FREE PO LIQD
60.0000 mL | Freq: Three times a day (TID) | ORAL | Status: DC
  Administered 2019-06-29 – 2019-07-06 (×14): 60 mL
  Filled 2019-06-29 (×14): qty 60

## 2019-06-29 NOTE — Progress Notes (Addendum)
ANTICOAGULATION CONSULT NOTE  Pharmacy Consult:  Bivalirudin>>Warfarin Indication: stroke 2/5, LV thrombus 2/8, HIT 2/15  Allergies  Allergen Reactions  . Heparin Other (See Comments)    Heparin induced thrombocytopenia. 2/15 HIT OD 1.692. 2/16 SRA positive-90.     Patient Measurements: Height: 6\' 1"  (185.4 cm) Weight: 101.6 kg (223 lb 15.8 oz) IBW/kg (Calculated) : 79.9   Vital Signs: Temp: 97.6 F (36.4 C) (04/14 0354) Temp Source: Oral (04/14 0354) BP: 95/66 (04/14 0600) Pulse Rate: 88 (04/14 0600)  Labs: Recent Labs    06/27/19 0538 06/27/19 1545 06/28/19 0518 06/28/19 0816 06/28/19 0957 06/28/19 1437 06/28/19 1513 06/29/19 0334  HGB 8.2*  --  9.2*  --   --   --   --  9.4*  HCT 28.1*  --  32.2*  --   --   --   --  32.4*  PLT 259  --  298  --   --   --   --  320  APTT 71*  --  30   < > 29  --  39* 55*  LABPROT 21.2*  --  19.1*  --   --   --   --  21.0*  INR 1.8*  --  1.6*  --   --   --   --  1.8*  CREATININE 2.17*   < > 1.51*  --   --  1.44*  --  1.43*   < > = values in this interval not displayed.    Estimated Creatinine Clearance: 90.4 mL/min (A) (by C-G formula based on SCr of 1.43 mg/dL (H)).  Assessment: 34 yr old male with history of ESRD on HD presented with left MCA infarct - received TPA and revascularization with IR on 2/5. On 2/8 found to have small LV apical thrombus on ECHO. Pharmacy consulted to dose IV heparin, which was switched to bivalirudin 2/15 for HIT. 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 SRA very positive at 69. Heparin allergy has appropriately been added to chart and updated with SRA results. Transitioned off bivalrudin on 3/14. Restarted bivalirudin 3/26 and briefly held on 3/31 for chest tube placement.   4/14 AM: aPTT is therapeutic at 55 on 0.0979 mg/kg/hr. INR remains subtherapeutic at 1.8. Of note, patient is a VERY difficult stick due to extensive scarring after being stuck so many times with this  prologed admission and is currently adamantly refusing any additional sticks. No reports of bleeding or line issues per the RN and CBC is stable.   4/14 Addendum (after rounds): Stop bivalirudin per MD and continue with warfarin for anticoagulation. MD will like to be a little more aggressive with warfarin dosing today, so will give 7.5 mg x1 today and monitor INR daily for dose adjustments.   Goal of Therapy:  INR 2-3 Monitor platelets by anticoagulation protocol: Yes   Plan:  Discontinue bivalirudin  Warfarin 7.5 mg x1  Daily INR Monitor for s/sx of bleeding  Lorel Monaco, PharmD PGY1 Ambulatory Care Resident [employer] # [phone]

## 2019-06-29 NOTE — Procedures (Signed)
Tracheostomy Change Note  Patient Details:   Name: Carlos Brewer. DOB: 08/13/1985 MRN: [id number]    Airway Documentation:  Trach tube changed by CCM. #6 XLT Shiley Distal cuffed placed without complications.    Evaluation  O2 sats: stable throughout Complications: No apparent complications Patient did tolerate procedure well. Bilateral Breath Sounds: Clear, Diminished    Ander Purpura 06/29/2019, 10:23 AM

## 2019-06-29 NOTE — Progress Notes (Signed)
eLink Physician-Brief Progress Note Patient Name: Phong Isenberg. DOB: 25-Jan-1986 MRN: [id number]   Date of Service  06/29/2019  HPI/Events of Note  Multiple loose BM's. Now with skin breakdown. Request for Flexiseal.  eICU Interventions  Will order: 1. Place Flexiseal.      Intervention Category Major Interventions: Other:  [name] 06/29/2019, 8:06 PM

## 2019-06-29 NOTE — Progress Notes (Addendum)
Nutrition Follow-up  **RD working remotely**  DOCUMENTATION CODES:   Not applicable  INTERVENTION:  Vital 1.5 @ 55 ml/hr (1320 ml/day) via PEG 60 ml Prostat TID  100 ml every 4 hours: 600 ml   Provides: 2580 kcal, 189 grams protein, and 1670 ml free water.    NUTRITION DIAGNOSIS:   Inadequate oral intake related to inability to eat as evidenced by NPO status.  Ongoing.  GOAL:   Patient will meet greater than or equal to 90% of their needs  Met with TF.   MONITOR:   TF tolerance  REASON FOR ASSESSMENT:   Ventilator    ASSESSMENT:   Pt with PMH of CHF, OSA, poorly controlled DM, cocaine abuse admitted with L MCA stroke s/p tPA and IR thrombectomy.   2/6 started on CRRT  2/18 s/p trach placement 3/10 CRRT stopped 3/12 iHD 3/17 febrile; developed septic shock on pressors, lines changed, on abx  3/29 s/p bronch, chest xray shows resolving LLL PNA due to aspiration with poor control of secretions  3/31 pt had rapid response on progressive unit, pt transferred to ICU s/p trach change and on currently on vent support 4/8 s/p PEG placement 4/9 transferred out of ICU 4/10 desaturations, too "unastable" for HD from respiratory standpoint 4/11 transferred back to ICU for CRRT  Pt anuric without evidence of renal recovery per nephrology. Pt continues on CRRT, net UF 50 cc/h. Per MD, renal GOC discussion should be had with pt's mother. If pt is to continue on CRRT, will check thiamine, vitamin c, zinc, folate, copper, and vitamin B6 at follow-up.   Labs reviewed: Mg 2.6 (H) CBGs: 128-113-96  Medications reviewed and include: aranesp, Novolog, Levemir, Miralax, Coumadin, Levophed (stopped)   Diet Order:   Diet Order            Diet NPO time specified Except for: Sips with Meds  Diet effective midnight              EDUCATION NEEDS:   No education needs have been identified at this time  Skin:  Skin Assessment: Skin Integrity Issues: Skin Integrity  Issues:: DTI, Other (Comment) DTI: L heel Stage II: NA Other: skin tear to coccyx and penis  Last BM:  4/14  Height:   Ht Readings from Last 1 Encounters:  05/12/2019 _0  (1.854 m)    Weight:   Wt Readings from Last 1 Encounters:  06/29/19 101.6 kg    Ideal Body Weight:  83.6 kg  BMI:  Body mass index is 29.55 kg/m.  Estimated Nutritional Needs:   Kcal:  2500-2700  Protein:  167-209 grams  Fluid:  2 L/day   Larkin Ina, MS, RD, LDN RD pager number and weekend/on-call pager number located in [place].

## 2019-06-29 NOTE — Progress Notes (Signed)
Patient ID: Carlos Brewer., male   DOB: November 20, 1985, 34 y.o.   MRN: [id number] [facility] Progress Note   Assessment/ Plan:   1. End stage renal disease following prolonged dialysis dependent AKI: multifactorial etiology including ATN following cardiogenic/septic shock; anuric and without evidence of renal recovery.  I will continue CRRT at this time for clearance/volume management; hemodynamic instability has been challenging to consistent transition to intermittent hemodialysis.  Potassium level controlled on 2K bath. 2. Acute stroke: Likely embolic with bilateral infarcts and LV thrombus.  Has residual encephalopathy.  Gastric tube placed by IR on 4/8.  He has evidence of HIT and is on bivalirudin. 3. Hypoxic respiratory failure: prolonged ventilator dependent, has tracheostomy (with intermittent bleeding that has improved).    With trach collar trials. 4. Shock: On midodrine and appears to be tolerating CRRT/UF- levophed currently on hold. 5. Anemia: Continue ESA.  No Iron because of concern of infection.  Transfuse as needed. 6. CKD-MBD: Calcium and phosphorus levels at goal, PTH 70.  Subjective:   Without acute events overnight-ordering CRRT without problem net UF 50 cc/h.  Objective:   BP 140/85   Pulse 90   Temp 97.6 F (36.4 C) (Oral)   Resp (!) 35   Ht 6' 1"  (1.854 m)   Wt 101.6 kg   SpO2 100%   BMI 29.55 kg/m   Intake/Output Summary (Last 24 hours) at 06/29/2019 0746 Last data filed at 06/29/2019 0700 Gross per 24 hour  Intake 3691.84 ml  Output 4461 ml  Net -769.16 ml   Weight change: -2.7 kg  Physical Exam: MWU:[id number] comfortable on ventilator via trach, awake/alert UUV:OZDGU regular rhythm and normal rate. s1 and s2 normal. RIJ TDC connected to CRRT Resp:Anteriorly coarse breath sounds bilaterally, no rales/rhonchi YQI:HKVQ, obese, non-tender, PEG tube in situ Ext: No LE edema bilaterally  Imaging: No results  found.  Labs: BMET Recent Labs  Lab 06/26/19 0451 06/26/19 2011 06/27/19 0538 06/27/19 1545 06/28/19 0518 06/28/19 1437 06/29/19 0334  NA 137 136 137 135 137 137 136  K 5.3* 5.6* 5.0 6.2* 4.7 4.6 4.5  CL 100 100 98 99 99 99 98  CO2 17* 17* 22 21* 22 21* 20*  GLUCOSE 180* 130* 146* 127* 96 99 105*  BUN 56* 54* 47* 45* 41* 42* 41*  CREATININE 3.00* 2.48* 2.17* 1.76* 1.51* 1.44* 1.43*  CALCIUM 9.0 9.0 9.4 8.7* 9.2 9.0 9.4  PHOS 4.2 3.7 3.4 3.4 2.7 2.8 2.5   CBC Recent Labs  Lab 06/26/19 0458 06/27/19 0538 06/28/19 0518 06/29/19 0334  WBC 14.9* 13.4* 13.5* 13.4*  HGB 9.1* 8.2* 9.2* 9.4*  HCT 30.5* 28.1* 32.2* 32.4*  MCV 105.5* 105.6* 107.3* 104.5*  PLT 265 259 298 320    Medications:    . ARIPiprazole  10 mg Per Tube Daily  . aspirin  81 mg Per Tube Daily  . atorvastatin  40 mg Per Tube q1800  . chlorhexidine gluconate (MEDLINE KIT)  15 mL Mouth Rinse BID  . Chlorhexidine Gluconate Cloth  6 each Topical Q0600  . [START ON 07/04/2019] Darbepoetin Alfa  150 mcg Intravenous Q Mon-1800  . feeding supplement (PRO-STAT SUGAR FREE 64)  60 mL Per Tube BID  . FLUoxetine  20 mg Per Tube Daily  . free water  100 mL Per Tube Q4H  . insulin aspart  0-20 Units Subcutaneous Q4H  . insulin aspart  7 Units Subcutaneous Q4H  . insulin detemir  30 Units Subcutaneous BID  .  mouth rinse  15 mL Mouth Rinse 10 times per day  . midodrine  10 mg Per Tube Q8H  . neomycin-bacitracin-polymyxin  1 application Topical Daily  . pantoprazole sodium  40 mg Per Tube Q1200  . polyethylene glycol  17 g Per Tube Daily  . QUEtiapine  25 mg Per Tube BID  . sodium chloride flush  10-40 mL Intracatheter Q12H  . warfarin  5 mg Oral ONCE-1600  . Warfarin - Pharmacist Dosing Inpatient   Does not apply q1600   Elmarie Shiley, MD 06/29/2019, 7:46 AM

## 2019-06-29 NOTE — Progress Notes (Signed)
NAME:  Carlos Wyss., MRN:  [id number], DOB:  [birth year], LOS: 29 ADMISSION DATE:  04/21/2019, CONSULTATION DATE:  05/15/2019 REFERRING MD:  Dr. Leonel Ramsay, CHIEF COMPLAINT:  Slurred speech  Brief History   34 yo male smoker found to have slurred speech and Rt sided weakness.  Admitted 2/5 with left MCA CVA and multiple smaller embolic infarcts>>>  tPA and thrombectomy in IR.  Additionally found to have left ventricular  thrombus.    Course complicated by septic shock r/t HCAP, AKI requiring CRRT, polymorphic VT and cardiogenic shock.  Intermittent pressor dependence, required tracheostomy Intermittent hemodialysis [place] CVA Heart failure with reduced ejection fraction < 15% on last echocardiogram Recurrent episodes of bilateral infiltrates and mechanical ventilation?  Aspiration pneumonitis  Past Medical History  Systolic CHF with non ischemic CM, Cocaine abuse, OSA, DM Hx of CHF (EF 15%)  Hx myocarditis Smoker 1/2 ppd   Significant Hospital Events   2/05 Admit, tPA, IR thrombectomy 2/6 Shock requiring increasing levophed and phenylephrine.   2/6: switched to levophed, epi, started antibiotics, started on CRRT.  2/9  developed wide-complex tachycardia and hypotension, started on amiodarone drip, increase Levophed drip 2/15 drop in platelets, heparin stopped, bival started 2/17 Hypotensive and back on Levophed 2/18 trach, lines changed 2/19 started milrinone , fever 103 2/20 Episode of wide-complex tachycardia yesterday, changed from Levophed to vasopressin 2/22 stopping vanc. Still on inotrope support. Some AF w/ RVR. Had to be placed back on pressors. ivabradine added 2/23 still on pressors/ CRRT continued 2/24 tmax 98.4, Remains on CRRT, even UF, remains anuric, Levophed at 14 mcg/min, Milrinone at 0.125 mcg/kg/min, Coox 63.4 Doing well this morning on ATC, no events overnight.  Midodrine added   2/25 more interactive, remains on ATC, tmax 99.5/ WBC 21.4, ~900 ml  liquid stool/ 24 hours, neg Cdiff, Levophed up to 20 mcg/min, CVP 2, ,milrinone remains at 0.125 mcg/kg/min, coox 92.7, Off CRRT since last night ~2000 s/p clotted x 3 off citrate, restarted  3/1 Amio drip restarted for WCT/atrial flutter, milrinone turned off 3/2 CRRT stopped, permacath placed 3/5 crrt resumed 3/6 started on pressors for crrt tolerance 3/8 No longer on CRRT or pressor support  3/12 Tolerated PS for >2 hours  3/17  febrile 106 overnight, shock on pressors, new lines placed, broad-spectrum antibiotics 3/22: Awake, appears comfortable on pressure support ventilation. weaning Precedex, he is back on low-dose norepinephrine 3/23 pm back on vent at hs for resp distress / desats ? chf on cxr  3/24 t collar 24/7 3/29 tolerating ATM without difficulty 3/31 tr back to ICU -trach changed to 6 cuffless, bronched, new fever, started on antibiotics, pneumomediastinum small biapical pneumothoraces-stable 4/1 CRRT transient 4/5 started back on HD tiw, pneumothorax, pneumomediastinum resolved. Active tracheal bleeding> Bival held 4/6 reduced amiodarone to 200 mg daily, bival resumed 4/7 Off levo, tolerating trach collar.  4/9 transfer out of ICU 4/10 desaturations, too "unastable" for HD from respiratory standpoint 4/11 send back to ICU for CRRT 4/13 Intermittent junctional bradycardia noted >> amio stopped  Consults:  Neuro IR Cardiology  Nephrology Heart Failure  EP   Procedures:  2/5-2/6:  TPA given at 428 am (total of 90 Mg) 520 am went to IR  S/P Lt common carotid arteriogram followed by complete revascularization of occluded LT MCA sup division mid M2 seg with x 1 pass with 48mx 40 mm solitaire X ret river device and penumbra aspiration with TICI 3 revascularization  ETT 2/05 >> 2/18 LIJ CVL 2/5 >>  2/18  RT [place] CVL 2/18 >>3/2 RIJ HD cath 2/6 >>2/18 6 shiley cuffed trach 2/18  >>3/4  RT fem HD 2/19 >>3/2 RIJ permacath 3/2>> 3/4 shiley 4 uncuffed.... dislodged placed  back 6cuffed shiley that evening 3/5: change to #6 cuffed distal XLT  3/17 right femoral CVL >> 3/23 3/17 right femoral arterial line >>3/23 3/31 bronchoscopy : airway clear, conclusion = end of trach up against the wall obstructing airflow   3/31 trach change to cuffed #6 XLT  4/7 PEG by IR  Significant Diagnostic Tests:  CT angio head/neck 2/05 >> occlusion of Lt MCA bifurcation Echo 2/05 >> EF less than 20%, cannot rule out apical thrombus MRI brain 2/11 > extensive acute infarction of multiple areas without large or medium vessel occlusion Echocardiogram 2/8 Left ventricular ejection fraction, by estimation, is <20%. The left  ventricle has severely decreased function. The left ventricle demonstrates  global hypokinesis. The left ventricular internal cavity size was severely  dilated. Possible small 0.8 x 0.6 cm apical thrombus.   3/16 duplex upper and lower extremities >>neg  3/17 head CT >.  New area of hypoattenuation in the left pons , left frontal encephalomalacia related to old infarct 3/31CT chestreveals extensive pneumomediastinum and subcutaneous emphysema in the lower neck.  There is less than 5% bilateral pneumothoraces.  Extensive airspace disease right greater than left consistent with ARDS or infection  3/31 head CT >> neg for new pathology Micro Data:  SARS CoV2 PCR 2/05 >> negative Influenza PCR 2/05 >> negative ... Cdiff 2/25 >> neg Trach asp 3/11>> Few candida albicans BC 3/11>> 1 of 4 with Coag neg staph  ...  trach 3/24 >  no  wbc, no organisms final - nl flora  Bronchoscopy 3/31  moderate GPC, normal respiratory flora Blood cultures 3/31 Neg   resp 4/11 >> nml flora Blood 4/12 >> ng  Antimicrobials:  mero 2/6 -2/12 Zosyn 2/6 , 2/19 >> 2/23 vanc 2/6 >> 2/9 , 2/19 >>2/22 Ceftriaxone 3/12> 3/16 3/17 vanc >> 3/19 3/17 Anidulafungin >> 3/19 3/17 cefepime >>  3/22  3/31 vancomycin>>4/3 3/31 cefepime>>4/3 Unasyn 4/3>>4/4  4/12 meropenem >> 4/12  vanc >>  Interim history/subjective:   Remains on CRRT On trach collar Afebrile Off Levophed    Objective   Blood pressure 118/65, pulse 88, temperature (!) 96.9 F (36.1 C), temperature source Axillary, resp. rate (!) 25, height 6' 1"  (1.854 m), weight 101.6 kg, SpO2 100 %.    FiO2 (%):  [40 %-60 %] 40 %   Intake/Output Summary (Last 24 hours) at 06/29/2019 1113 Last data filed at 06/29/2019 1100 Gross per 24 hour  Intake 3707.48 ml  Output 4326 ml  Net -618.52 ml   Filed Weights   06/27/19 0415 06/28/19 0515 06/29/19 0407  Weight: 106.2 kg 104.3 kg 101.6 kg        Physical Exam.  Unchanged GEN: Young man sitting up in bed , on CRRT, no distress, mild shivering HEENT: Trach in place, minimal bloody secretions CV: S1-S2 regular PULM:  periodic breathing +, no accessory muscle use EXT: trace edema NEURO: Follows one-step commands PSYCH: RASS 0 SKIN: No rashes   Chest x-ray 4/11 personally reviewed which shows diffuse bilateral airspace disease Labs show normal electrolytes, mild leukocytosis   Resolved Hospital Problem list   HCAP 4/33, Acute metabolic encephalopathy 2nd to hypoxia and renal failure, Cardiogenic shock HIT  Mixed Cardiogenic Septic shock, present on admission Pneumomediastinum, pneumothoraces> resolved by pCXR 4/5   Assessment &  Plan:   Acute on chronic respiratory failure- Cheyne stokes breathing resulting in desaturations (from CHF and stroke).  Hypoxia during this last episode related to volume overload rather than aspiration >> improved  -Continue trach collar trials -Minimize O2   Tracheostomy with intermittent bleeding- improved -XLT distal cuffed trach changed 4/14  Hypotension -improved ,continue midodrine, off Levophed    End stage cardiomyopathy/chronic systolic heart failure -Unable to add meds due to soft blood pressure -Off amio  End stage renal failure-intolerant of regular HD -Transition from CRRT to intermittent HD  again  HAP -with fever on 4/11, BC empiric antibiotics since cultures remain negative  HIT- ct coumadin , INR is up to 1.8, DC bivalirudin   Uncontrolled diabetes type 2 -better controlled  Decrease Levemir 25 units twice daily Dc Tube feed coverage  Ct SSI   Summary-prognosispoor long-term.  He seems to be rallying again but I do think we have to discuss renal goals of care with patient's mother and limit further CRRT use once we transition to intermittent dialysis-defer to renal service to have this conversation      [name] MD. [place] Flood. [facility]  If no response to pager , please call [phone]   06/29/2019

## 2019-06-29 NOTE — Progress Notes (Signed)
Physical Therapy Treatment Patient Details Name: Carlos Brewer. MRN: [id number] DOB: [birth year] Today's Date: 06/29/2019    History of Present Illness Pt is a 34 y/o male smoker who initially presented on 2/5 with slurred speech and Rt sided weakness. Admitted with left MCA CVA and multiple smaller embolic infarcts s/p tPA and thrombectomy in IR.  Additionally found to have left ventricular  thrombus. Hospital course complicated by septic shock r/t HCAP, AKI requiring CRRT, polymorphic VT and cardiogenic shock. Echo EF < 20%. Pt with Intermittent pressor dependence, required tracheostomy. Tolerating trach collar 3/4, back on vent 3/5 with flash pulmonary edema.  Started IHD 3/10.  G-tube placed 06/23/19.    PT Comments    Pt lethargic on arrival, but quickly aroused and was relatively participative today.  Pt initiated balance reaction and standing today without cues/commands.  Emphasis on transitions, sitting balance, focus on task and sit to stand.    Follow Up Recommendations  LTACH     Equipment Recommendations  Other (comment)    Recommendations for Other Services       Precautions / Restrictions Precautions Precautions: Fall Precaution Comments: trach collar, watch BP, NG    Mobility  Bed Mobility Overal bed mobility: Needs Assistance       Supine to sit: +2 for physical assistance;Max assist Sit to supine: Total assist;+2 for physical assistance   General bed mobility comments: directional cues and truncal assist for normalized movement, hand over hand  up onto R elbow then R hand to assist  Transfers Overall transfer level: Needs assistance   Transfers: Sit to/from Stand Sit to Stand: Mod assist;Max assist(mod+2  x1 and max +2  x2)         General transfer comment: pt self initiated 2/3 standing trials today by leaning forward and kicking in bil LE's without cues  Ambulation/Gait             General Gait Details: unable   Stairs              Wheelchair Mobility    Modified Rankin (Stroke Patients Only) Modified Rankin (Stroke Patients Only) Pre-Morbid Rankin Score: No symptoms Modified Rankin: Severe disability     Balance Overall balance assessment: Needs assistance Sitting-balance support: Feet supported;Single extremity supported Sitting balance-Leahy Scale: Poor Sitting balance - Comments: Worked on balance,  pt took more initiative to activate and maintain midline, but still unable to hold for long.  Abs not as strong as his paraspinals     Standing balance-Leahy Scale: Poor Standing balance comment: pt stood at EOB x3, 2/3 fully upright with pelvis tucked in and fully upright.                            Cognition Arousal/Alertness: Awake/alert Behavior During Therapy: Flat affect Overall Cognitive Status: Impaired/Different from baseline                     Current Attention Level: Sustained;Selective   Following Commands: Follows one step commands with increased time Safety/Judgement: Decreased awareness of safety;Decreased awareness of deficits Awareness: Intellectual Problem Solving: Slow processing;Decreased initiation;Requires verbal cues;Requires tactile cues        Exercises Other Exercises Other Exercises: resisted hip/knee flex/ext bil x5 to warm up. Other Exercises: warm up ROM exercise to activate LE's    General Comments General comments (skin integrity, edema, etc.): vss, with PMV, vss through out.      Pertinent Vitals/Pain  Pain Assessment: Faces Faces Pain Scale: No hurt Pain Intervention(s): Monitored during session    Home Living                      Prior Function            PT Goals (current goals can now be found in the care plan section) Acute Rehab PT Goals Patient Stated Goal: to d/c home with mother after rehab PT Goal Formulation: With patient/family Time For Goal Achievement: 07/04/19 Potential to Achieve Goals: Fair Progress  towards PT goals: Progressing toward goals    Frequency    Min 3X/week      PT Plan Current plan remains appropriate    Co-evaluation              AM-PAC PT "6 Clicks" Mobility   Outcome Measure  Help needed turning from your back to your side while in a flat bed without using bedrails?: Total Help needed moving from lying on your back to sitting on the side of a flat bed without using bedrails?: Total Help needed moving to and from a bed to a chair (including a wheelchair)?: Total Help needed standing up from a chair using your arms (e.g., wheelchair or bedside chair)?: Total Help needed to walk in hospital room?: Total Help needed climbing 3-5 steps with a railing? : Total 6 Click Score: 6    End of Session Equipment Utilized During Treatment: Oxygen Activity Tolerance: Patient limited by fatigue;Patient tolerated treatment well Patient left: in bed;with call bell/phone within reach;with bed alarm set Nurse Communication: Mobility status PT Visit Diagnosis: Other abnormalities of gait and mobility (R26.89);Other symptoms and signs involving the nervous system (R29.898);Muscle weakness (generalized) (M62.81)     Time: 1415-1450 PT Time Calculation (min) (ACUTE ONLY): 35 min  Charges:  $Therapeutic Activity: 8-22 mins $Neuromuscular Re-education: 8-22 mins                     06/29/2019  Ginger Carne., PT Acute Rehabilitation Services [phone]  (pager) [phone]  (office)   Carlos Brewer 06/29/2019, 3:37 PM

## 2019-06-29 NOTE — Progress Notes (Signed)
eLink Physician-Brief Progress Note Patient Name: Carlos Brewer. DOB: [birth year] MRN: [id number]   Date of Service  06/29/2019  HPI/Events of Note  Diarrhea   eICU Interventions  Will D/C [facility].     Intervention Category Major Interventions: Other:  [name] 06/29/2019, 11:18 PM

## 2019-06-30 ENCOUNTER — Inpatient Hospital Stay (HOSPITAL_COMMUNITY): Payer: No Typology Code available for payment source

## 2019-06-30 DIAGNOSIS — N179 Acute kidney failure, unspecified: Secondary | ICD-10-CM | POA: Diagnosis not present

## 2019-06-30 DIAGNOSIS — Z930 Tracheostomy status: Secondary | ICD-10-CM | POA: Diagnosis not present

## 2019-06-30 DIAGNOSIS — J9601 Acute respiratory failure with hypoxia: Secondary | ICD-10-CM | POA: Diagnosis not present

## 2019-06-30 LAB — RENAL FUNCTION PANEL
Albumin: 2.7 g/dL — ABNORMAL LOW (ref 3.5–5.0)
Anion gap: 17 — ABNORMAL HIGH (ref 5–15)
BUN: 43 mg/dL — ABNORMAL HIGH (ref 6–20)
CO2: 24 mmol/L (ref 22–32)
Calcium: 9.4 mg/dL (ref 8.9–10.3)
Chloride: 95 mmol/L — ABNORMAL LOW (ref 98–111)
Creatinine, Ser: 1.53 mg/dL — ABNORMAL HIGH (ref 0.61–1.24)
GFR calc Af Amer: >60 mL/min (ref >60)
GFR calc non Af Amer: 58 mL/min — ABNORMAL LOW (ref >60)
Glucose, Bld: 126 mg/dL — ABNORMAL HIGH (ref 70–99)
Phosphorus: 3.1 mg/dL (ref 2.5–4.6)
Potassium: 3.7 mmol/L (ref 3.5–5.1)
Sodium: 136 mmol/L (ref 135–145)

## 2019-06-30 LAB — APTT: aPTT: 63 seconds — ABNORMAL HIGH (ref 24–36)

## 2019-06-30 LAB — CBC
HCT: 34.8 % — ABNORMAL LOW (ref 39.0–52.0)
Hemoglobin: 10.3 g/dL — ABNORMAL LOW (ref 13.0–17.0)
MCH: 30.4 pg (ref 26.0–34.0)
MCHC: 29.6 g/dL — ABNORMAL LOW (ref 30.0–36.0)
MCV: 102.7 fL — ABNORMAL HIGH (ref 80.0–100.0)
Platelets: 346 10*3/uL (ref 150–400)
RBC: 3.39 MIL/uL — ABNORMAL LOW (ref 4.22–5.81)
RDW: 18.6 % — ABNORMAL HIGH (ref 11.5–15.5)
WBC: 12.9 10*3/uL — ABNORMAL HIGH (ref 4.0–10.5)
nRBC: 1.7 % — ABNORMAL HIGH (ref 0.0–0.2)

## 2019-06-30 LAB — GLUCOSE, CAPILLARY
Glucose-Capillary: 114 mg/dL — ABNORMAL HIGH (ref 70–99)
Glucose-Capillary: 120 mg/dL — ABNORMAL HIGH (ref 70–99)
Glucose-Capillary: 136 mg/dL — ABNORMAL HIGH (ref 70–99)
Glucose-Capillary: 142 mg/dL — ABNORMAL HIGH (ref 70–99)
Glucose-Capillary: 59 mg/dL — ABNORMAL LOW (ref 70–99)
Glucose-Capillary: 75 mg/dL (ref 70–99)
Glucose-Capillary: 78 mg/dL (ref 70–99)
Glucose-Capillary: 99 mg/dL (ref 70–99)

## 2019-06-30 LAB — PROTIME-INR
INR: 1.2 (ref 0.8–1.2)
Prothrombin Time: 15.3 seconds — ABNORMAL HIGH (ref 11.4–15.2)

## 2019-06-30 MED ORDER — SODIUM CHLORIDE 0.9 % IV SOLN
0.0700 mg/kg/h | INTRAVENOUS | Status: DC
  Administered 2019-06-30 – 2019-07-03 (×3): 0.07 mg/kg/h via INTRAVENOUS
  Filled 2019-06-30 (×3): qty 250

## 2019-06-30 MED ORDER — DEXTROSE 50 % IV SOLN
INTRAVENOUS | Status: AC
  Administered 2019-06-30: 25 mL
  Filled 2019-06-30: qty 50

## 2019-06-30 MED ORDER — ISOSORB DINITRATE-HYDRALAZINE 20-37.5 MG PO TABS
1.0000 | ORAL_TABLET | Freq: Two times a day (BID) | ORAL | Status: DC
  Administered 2019-07-01 – 2019-07-09 (×10): 1
  Filled 2019-06-30 (×20): qty 1

## 2019-06-30 MED ORDER — WARFARIN SODIUM 7.5 MG PO TABS
7.5000 mg | ORAL_TABLET | Freq: Once | ORAL | Status: DC
  Filled 2019-06-30: qty 1

## 2019-06-30 MED ORDER — POLYETHYLENE GLYCOL 3350 17 G PO PACK: 17.0000 g | PACK | Freq: Every day | ORAL | Status: DC | PRN

## 2019-06-30 MED ORDER — WARFARIN SODIUM 10 MG PO TABS
10.0000 mg | ORAL_TABLET | Freq: Once | ORAL | Status: DC
  Filled 2019-06-30: qty 1

## 2019-06-30 NOTE — Progress Notes (Signed)
Dr. Royce Macadamia paged about CRRT filter pressures rising after filter change @0130 . Will continue with current treatment and assess when day team comes in. Continuing to monitor.

## 2019-06-30 NOTE — Progress Notes (Addendum)
ANTICOAGULATION CONSULT NOTE  Pharmacy Consult:  Bivalirudin and Warfarin bridge Indication: stroke 2/5, LV thrombus 2/8, HIT 2/15  Allergies  Allergen Reactions  . Heparin Other (See Comments)    Heparin induced thrombocytopenia. 2/15 HIT OD 1.692. 2/16 SRA positive-90.     Patient Measurements: Height: 6\' 1"  (185.4 cm) Weight: 98 kg (216 lb 0.8 oz) IBW/kg (Calculated) : 79.9   Vital Signs: Temp: 97.7 F (36.5 C) (04/15 1113) Temp Source: Axillary (04/15 1113) BP: 112/76 (04/15 1200) Pulse Rate: 94 (04/15 1200)  Labs: Recent Labs    06/28/19 0518 06/28/19 0518 06/28/19 0816 06/28/19 0957 06/28/19 1437 06/28/19 1513 06/29/19 0334 06/29/19 1537 06/30/19 0252  HGB 9.2*   < >  --   --   --   --  9.4*  --  10.3*  HCT 32.2*  --   --   --   --   --  32.4*  --  34.8*  PLT 298  --   --   --   --   --  320  --  346  APTT 30  --    < > 29  --  39* 55*  --   --   LABPROT 19.1*  --   --   --   --   --  21.0*  --  15.3*  INR 1.6*  --   --   --   --   --  1.8*  --  1.2  CREATININE 1.51*   < >  --   --    < >  --  1.43* 1.43* 1.53*   < > = values in this interval not displayed.    Estimated Creatinine Clearance: 83 mL/min (A) (by C-G formula based on SCr of 1.53 mg/dL (H)).  Assessment: 34 yr old male with history of ESRD on HD presented with left MCA infarct - received TPA and revascularization with IR on 2/5. On 2/8 found to have small LV apical thrombus on ECHO. Pharmacy consulted to dose IV heparin, which was switched to bivalirudin 2/15 for HIT. 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 SRA very positive at 79. Heparin allergy has appropriately been added to chart and updated with SRA results.   Transitioned off bivalrudin on 4/14 and restarted today 4/15. Warfarin continued.  INR subtherapeutic at 1.2 which is expected with discontinuation of Bivalirudin which can cause transient INR elevations. CBC stable  Patient is currently off of CRRT  with plans on transitioning to IHD, While on CRRT patient required higher doses of bivalirudin, last therapeutic dose was 0.0979 mg/kg/hr. Patient is currently not on any renal replacement therapy and was previously on amiodarone. Will plan to start bivalirudin at 0.07 mg/kg/hr since not on CRRT but required higher doses while on CRRT. Plan to check aPTT in 4 hours, if therapeutic, will recheck with AM labs due to patient being a very difficult stick due to extensive scarring after being stuck so many times with this prologed admission and previously refusing additional sticks. If 4 hour aPTT not therapeutic, consider checking another 4 hour level.   Per protocol, will stop bivalirudin when INR > 3.0 and check INR and aPTT 4-6 hr later.   Goal of Therapy:  aPTT 50-85 seconds INR 2-3 Monitor platelets by anticoagulation protocol: Yes   Plan:   Start Bivalirudin 0.07 mg/kg/hr Check 4 hr aPTT Warfarin 10 mg x1  Monitor daily aPTT, CBC, INR, BMET Monitor for s/sx of bleeding  Lorel Monaco, PharmD PGY1 Ambulatory Care Resident [employer] # [phone]

## 2019-06-30 NOTE — Progress Notes (Signed)
NAME:  Carlos Austad., MRN:  [id number], DOB:  [birth year], LOS: 72 ADMISSION DATE:  04/30/2019, CONSULTATION DATE:  04/25/2019 REFERRING MD:  Dr. Leonel Ramsay, CHIEF COMPLAINT:  Slurred speech  Brief History   34 yo male smoker found to have slurred speech and Rt sided weakness.  Admitted 2/5 with left MCA CVA and multiple smaller embolic infarcts>>>  tPA and thrombectomy in IR.  Additionally found to have left ventricular  thrombus.    Course complicated by septic shock r/t HCAP, AKI requiring CRRT, polymorphic VT and cardiogenic shock.  Intermittent pressor dependence, required tracheostomy Intermittent hemodialysis [place] CVA Heart failure with reduced ejection fraction < 15% on last echocardiogram Recurrent episodes of bilateral infiltrates and mechanical ventilation?  Aspiration pneumonitis  Past Medical History  Systolic CHF with non ischemic CM, Cocaine abuse, OSA, DM Hx of CHF (EF 15%)  Hx myocarditis Smoker 1/2 ppd   Significant Hospital Events   2/05 Admit, tPA, IR thrombectomy 2/6 Shock requiring increasing levophed and phenylephrine.   2/6: switched to levophed, epi, started antibiotics, started on CRRT.  2/9  developed wide-complex tachycardia and hypotension, started on amiodarone drip, increase Levophed drip 2/15 drop in platelets, heparin stopped, bival started 2/17 Hypotensive and back on Levophed 2/18 trach, lines changed 2/19 started milrinone , fever 103 2/20 Episode of wide-complex tachycardia yesterday, changed from Levophed to vasopressin 2/22 stopping vanc. Still on inotrope support. Some AF w/ RVR. Had to be placed back on pressors. ivabradine added 2/23 still on pressors/ CRRT continued 2/24 tmax 98.4, Remains on CRRT, even UF, remains anuric, Levophed at 14 mcg/min, Milrinone at 0.125 mcg/kg/min, Coox 63.4 Doing well this morning on ATC, no events overnight.  Midodrine added   2/25 more interactive, remains on ATC, tmax 99.5/ WBC 21.4, ~900 ml  liquid stool/ 24 hours, neg Cdiff, Levophed up to 20 mcg/min, CVP 2, ,milrinone remains at 0.125 mcg/kg/min, coox 92.7, Off CRRT since last night ~2000 s/p clotted x 3 off citrate, restarted  3/1 Amio drip restarted for WCT/atrial flutter, milrinone turned off 3/2 CRRT stopped, permacath placed 3/5 crrt resumed 3/6 started on pressors for crrt tolerance 3/8 No longer on CRRT or pressor support  3/12 Tolerated PS for >2 hours  3/17  febrile 106 overnight, shock on pressors, new lines placed, broad-spectrum antibiotics 3/22: Awake, appears comfortable on pressure support ventilation. weaning Precedex, he is back on low-dose norepinephrine 3/23 pm back on vent at hs for resp distress / desats ? chf on cxr  3/24 t collar 24/7 3/29 tolerating ATM without difficulty 3/31 tr back to ICU -trach changed to 6 cuffless, bronched, new fever, started on antibiotics, pneumomediastinum small biapical pneumothoraces-stable 4/1 CRRT transient 4/5 started back on HD tiw, pneumothorax, pneumomediastinum resolved. Active tracheal bleeding> Bival held 4/6 reduced amiodarone to 200 mg daily, bival resumed 4/7 Off levo, tolerating trach collar.  4/9 transfer out of ICU 4/10 desaturations, too "unastable" for HD from respiratory standpoint 4/11 send back to ICU for CRRT 4/13 Intermittent junctional bradycardia noted >> amio stopped  Consults:  Neuro IR Cardiology  Nephrology Heart Failure  EP   Procedures:  2/5-2/6:  TPA given at 428 am (total of 90 Mg) 520 am went to IR  S/P Lt common carotid arteriogram followed by complete revascularization of occluded LT MCA sup division mid M2 seg with x 1 pass with 11mx 40 mm solitaire X ret river device and penumbra aspiration with TICI 3 revascularization  ETT 2/05 >> 2/18 LIJ CVL 2/5 >>  2/18  RT [place] CVL 2/18 >>3/2 RIJ HD cath 2/6 >>2/18 6 shiley cuffed trach 2/18  >>3/4  RT fem HD 2/19 >>3/2 RIJ permacath 3/2>> 3/4 shiley 4 uncuffed.... dislodged placed  back 6cuffed shiley that evening 3/5: change to #6 cuffed distal XLT  3/17 right femoral CVL >> 3/23 3/17 right femoral arterial line >>3/23 3/31 bronchoscopy : airway clear, conclusion = end of trach up against the wall obstructing airflow   3/31 trach change to cuffed #6 XLT  4/7 PEG by IR  Significant Diagnostic Tests:  CT angio head/neck 2/05 >> occlusion of Lt MCA bifurcation Echo 2/05 >> EF less than 20%, cannot rule out apical thrombus MRI brain 2/11 > extensive acute infarction of multiple areas without large or medium vessel occlusion Echocardiogram 2/8 Left ventricular ejection fraction, by estimation, is <20%. The left  ventricle has severely decreased function. The left ventricle demonstrates  global hypokinesis. The left ventricular internal cavity size was severely  dilated. Possible small 0.8 x 0.6 cm apical thrombus.   3/16 duplex upper and lower extremities >>neg  3/17 head CT >.  New area of hypoattenuation in the left pons , left frontal encephalomalacia related to old infarct 3/31CT chestreveals extensive pneumomediastinum and subcutaneous emphysema in the lower neck.  There is less than 5% bilateral pneumothoraces.  Extensive airspace disease right greater than left consistent with ARDS or infection  3/31 head CT >> neg for new pathology Micro Data:  SARS CoV2 PCR 2/05 >> negative Influenza PCR 2/05 >> negative ... Cdiff 2/25 >> neg Trach asp 3/11>> Few candida albicans BC 3/11>> 1 of 4 with Coag neg staph  ...  trach 3/24 >  no  wbc, no organisms final - nl flora  Bronchoscopy 3/31  moderate GPC, normal respiratory flora Blood cultures 3/31 Neg   resp 4/11 >> nml flora Blood 4/12 >> ng  Antimicrobials:  mero 2/6 -2/12 Zosyn 2/6 , 2/19 >> 2/23 vanc 2/6 >> 2/9 , 2/19 >>2/22 Ceftriaxone 3/12> 3/16 3/17 vanc >> 3/19 3/17 Anidulafungin >> 3/19 3/17 cefepime >>  3/22  3/31 vancomycin>>4/3 3/31 cefepime>>4/3 Unasyn 4/3>>4/4  4/12 meropenem  >>4/14 4/12 vanc >>4/14  Interim history/subjective:   Afebrile CRRT filter clotted and terminated Pulled PEG tube out    Objective   Blood pressure 122/84, pulse 98, temperature 97.7 F (36.5 C), temperature source Axillary, resp. rate (!) 21, height 6' 1"  (1.854 m), weight 98 kg, SpO2 100 %.    FiO2 (%):  [28 %-40 %] 28 %   Intake/Output Summary (Last 24 hours) at 06/30/2019 1212 Last data filed at 06/30/2019 1000 Gross per 24 hour  Intake 1968.2 ml  Output 4857 ml  Net -2888.8 ml   Filed Weights   06/29/19 0407 06/29/19 2252 06/30/19 0154  Weight: 101.6 kg 98 kg 98 kg        Physical Exam.  GEN: Young man sitting up in bed , no distress HEENT: Trach in place, minimal bloody secretions CV: S1-S2 regular PULM:  periodic breathing +, no accessory muscle use EXT: trace edema NEURO: Follows one-step commands PSYCH: RASS 0 SKIN: No rashes   Chest x-ray 4/15 personally reviewed which shows diffuse bilateral infiltrates with improved aeration Labs show normal electrolytes, mild decrease in leukocytosis   Resolved Hospital Problem list   HCAP 6/96, Acute metabolic encephalopathy 2nd to hypoxia and renal failure, Cardiogenic shock HIT  Mixed Cardiogenic Septic shock, present on admission Pneumomediastinum, pneumothoraces> resolved by pCXR 4/5   Assessment & Plan:  Acute on chronic respiratory failure- Cheyne stokes breathing resulting in desaturations (from CHF and stroke).  Hypoxia during this last episode related to volume overload rather than aspiration >> improved  -Continue trach collar  -On 28% FiO2   Tracheostomy with intermittent bleeding- improved -XLT distal cuffed trach changed 4/14  Hypotension -improved ,continue midodrine, off Levophed    End stage cardiomyopathy/chronic systolic heart failure -Add hydralazine 25 2 times daily -Off amio  End stage renal failure-intolerant of regular HD -Attempt transition from CRRT to intermittent HD  again  HAP -with fever on 4/11, observe off antibiotics  HIT- ct coumadin , INR is down to 1.2, resume bivalirudin   Uncontrolled diabetes type 2 -better controlled  Decrease Levemir 20 units twice daily Ct SSI  Intermittent agitation-pulled out PEG, will ask IR to reinsert Continue Seroquel nightly  Summary-prognosis poor long-term.  D/w renal about limiting further CRRT use once we transition to intermittent dialysis      [name] MD. [facility]. [facility]  If no response to pager , please call [phone]   06/30/2019

## 2019-06-30 NOTE — Progress Notes (Signed)
ANTICOAGULATION CONSULT NOTE  Pharmacy Consult:  Bivalirudin>>Warfarin Indication: stroke 2/5, LV thrombus 2/8, HIT 2/15  Allergies  Allergen Reactions  . Heparin Other (See Comments)    Heparin induced thrombocytopenia. 2/15 HIT OD 1.692. 2/16 SRA positive-90.     Patient Measurements: Height: 6\' 1"  (185.4 cm) Weight: 98 kg (216 lb 0.8 oz) IBW/kg (Calculated) : 79.9   Vital Signs: Temp: 97.9 F (36.6 C) (04/15 0349) Temp Source: Axillary (04/15 0349) BP: 119/67 (04/15 0630) Pulse Rate: 96 (04/15 0630)  Labs: Recent Labs    06/28/19 0518 06/28/19 0518 06/28/19 0816 06/28/19 0957 06/28/19 1437 06/28/19 1513 06/29/19 0334 06/29/19 1537 06/30/19 0252  HGB 9.2*   < >  --   --   --   --  9.4*  --  10.3*  HCT 32.2*  --   --   --   --   --  32.4*  --  34.8*  PLT 298  --   --   --   --   --  320  --  346  APTT 30  --    < > 29  --  39* 55*  --   --   LABPROT 19.1*  --   --   --   --   --  21.0*  --  15.3*  INR 1.6*  --   --   --   --   --  1.8*  --  1.2  CREATININE 1.51*   < >  --   --    < >  --  1.43* 1.43* 1.53*   < > = values in this interval not displayed.    Estimated Creatinine Clearance: 83 mL/min (A) (by C-G formula based on SCr of 1.53 mg/dL (H)).  Assessment: 34 yr old male with history of ESRD on HD presented with left MCA infarct - received TPA and revascularization with IR on 2/5. On 2/8 found to have small LV apical thrombus on ECHO. Pharmacy consulted to dose IV heparin, which was switched to bivalirudin 2/15 for HIT. 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 SRA very positive at 41. Heparin allergy has appropriately been added to chart and updated with SRA results. Transitioned off bivalrudin on 4/14 per MD and continued on warfarin.   Of note, patient is a VERY difficult stick due to extensive scarring after being stuck so many times with this prologed admission and is currently adamantly refusing any additional  sticks.  INR subtherapeutic at 1.2. CBC stable  Goal of Therapy:  INR 2-3 Monitor platelets by anticoagulation protocol: Yes   Plan:   Warfarin 7.5 mg x1  Daily INR Monitor for s/sx of bleeding  Lorel Monaco, PharmD PGY1 Ambulatory Care Resident [employer] # [phone]

## 2019-06-30 NOTE — Care Management (Signed)
CM forwarded a VM by CM JA received on 06/29/19 from [facility] with [facility] transfer coordiantor requesting callback.  CM attempted to call coordinator back however was unable to reach - CM left message requesting call back

## 2019-06-30 NOTE — Progress Notes (Signed)
SLP Cancellation Note  Patient Details Name: Carlos Brewer. MRN: [id number] DOB: January 21, 1986   Cancelled treatment:        Pt pulled out PEG and IV just prior to therapist preparing to work with him on PMV. Will continue efforts.    Houston Siren 06/30/2019, 4:10 PM  [name].Ed Risk analyst [phone] Office [phone]

## 2019-06-30 NOTE — Progress Notes (Signed)
Pt's mother at bedside very concerned that pt may have been agitated or "triggered" prior to peg removal. I explained to her that it happened on my shift and I came in and saw him with the peg out. I asked her if it was possible for him to have just been bored. MD explained to her how pt may be impulsive with previous stroke history.   Pt's mother also concerned that pt hasn't had anything by mouth and would like speech therapy to work with him. I told her that I don't know when he will be able to do any foods by mouth, but that speech therapy would be great for him and we would work on that.

## 2019-06-30 NOTE — Progress Notes (Addendum)
ANTICOAGULATION CONSULT NOTE  Pharmacy Consult:  Bivalirudin and Warfarin [place] Indication: stroke 2/5, LV thrombus 2/8, HIT 2/15  Allergies  Allergen Reactions  . Heparin Other (See Comments)    Heparin induced thrombocytopenia. 2/15 HIT OD 1.692. 2/16 SRA positive-90.     Patient Measurements: Height: 6\' 1"  (185.4 cm) Weight: 98 kg (216 lb 0.8 oz) IBW/kg (Calculated) : 79.9   Vital Signs: Temp: 98.1 F (36.7 C) (04/15 1941) Temp Source: Oral (04/15 1941) BP: 118/70 (04/15 2030) Pulse Rate: 92 (04/15 2043)  Labs: Recent Labs    06/28/19 0518 06/28/19 0518 06/28/19 0816 06/28/19 1513 06/29/19 0334 06/29/19 1537 06/30/19 0252 06/30/19 2011  HGB 9.2*   < >  --   --  9.4*  --  10.3*  --   HCT 32.2*  --   --   --  32.4*  --  34.8*  --   PLT 298  --   --   --  320  --  346  --   APTT 30  --    < > 39* 55*  --   --  63*  LABPROT 19.1*  --   --   --  21.0*  --  15.3*  --   INR 1.6*  --   --   --  1.8*  --  1.2  --   CREATININE 1.51*   < >   < >  --  1.43* 1.43* 1.53*  --    < > = values in this interval not displayed.    Estimated Creatinine Clearance: 83 mL/min (A) (by C-G formula based on SCr of 1.53 mg/dL (H)).  Assessment: 34 yr old male with history of ESRD on HD presented with left MCA infarct - received TPA and revascularization with IR on 2/5. On 2/8 found to have small LV apical thrombus on ECHO. Pharmacy consulted to dose IV heparin, which was switched to bivalirudin 2/15 for HIT. 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 SRA very positive at 55. Heparin allergy has appropriately been added to chart and updated with SRA results.   Transitioned off bivalrudin on 4/14 and restarted today 4/15. Warfarin continued.  INR subtherapeutic at 1.2 which is expected with discontinuation of bivalirudin which can cause transient INR elevations. H/H improved, platelets WNL.  Patient is currently off of CRRT with plans on transitioning to  IHD. While on CRRT, patient required higher doses of bivalirudin, last therapeutic dose was 0.0979 mg/kg/hr. Patient is currently not on any renal replacement therapy and was previously on amiodarone. This afternoon, pt was started bivalirudin at 0.07 mg/kg/hr since not on CRRT, but required higher doses while on CRRT.  aPTT ~3.5 hrs after restarting bivalirudin infusion was 63 sec, which is within the goal range for this pt. Will recheck aPTT with AM labs due to patient being a very difficult stick, due to extensive scarring after being stuck so many times with this prologed admission and previously refusing additional sticks.  Per protocol, will stop bivalirudin when INR > 3.0 and check INR and aPTT 4-6 hr later.   Per RN, no issues with IV or bleeding observed.  Goal of Therapy:  aPTT 50-85 seconds INR 2-3 Monitor platelets by anticoagulation protocol: Yes   Plan:   Continue bivalirudin infusion at 0.07 mg/kg/hr Check aPTT with AM labs Warfarin 10 mg X 1 - pt removed his PEG tube, so is unable to get this dose tonight (tube to be replaced tomorrow)  Monitor daily aPTT, CBC, INR, BMET Monitor for s/sx of bleeding  Gillermina Hu, PharmD, BCPS, [facility] Clinical Pharmacist 06/30/19, 21:05 PM

## 2019-06-30 NOTE — Progress Notes (Signed)
Patient ID: Carlos Mennenga., male   DOB: July 05, 1985, 34 y.o.   MRN: [id number] [facility] Progress Note   Assessment/ Plan:   1. End stage renal disease following prolonged dialysis dependent AKI: multifactorial etiology including ATN following cardiogenic/septic shock; anuric and without evidence of renal recovery.  Discontinue CRRT today at 5 PM or if filter clots (which ever comes earlier) with plan to transition to IHD as blood pressures appear permissive on midodrine. 2. Acute stroke: Likely embolic with bilateral infarcts and LV thrombus.  Has residual encephalopathy.  Gastric tube placed by IR on 4/8.  On warfarin. 3. Hypoxic respiratory failure: prolonged ventilator dependent, has tracheostomy (with intermittent bleeding that has improved).  Ongoing trach collar trials per CCM. 4. Shock: On midodrine after transiently requiring Levophed and appears to be tolerating CRRT/UF. 5. Anemia: Continue ESA.  No Iron because of concern of infection.  Transfuse as needed. 6. CKD-MBD: Calcium and phosphorus levels at goal, PTH 70.  Subjective:   Problems overnight with filter pressures on CRRT following discontinuation of bivalirudin (evidence of HIT).  Diarrhea noted overnight.  Objective:   BP 119/67   Pulse 96   Temp 97.9 F (36.6 C) (Axillary)   Resp 17   Ht 6' 1"  (1.854 m)   Wt 98 kg   SpO2 97%   BMI 28.50 kg/m   Intake/Output Summary (Last 24 hours) at 06/30/2019 0711 Last data filed at 06/30/2019 0700 Gross per 24 hour  Intake 2626.11 ml  Output 5480 ml  Net -2853.89 ml   Weight change: -3.6 kg  Physical Exam: Gen: Resting comfortably in bed, awakens to calling out his name CVS: Pulse regular rhythm and normal rate. s1 and s2 normal. RIJ TDC connected to CRRT Resp: Anteriorly coarse breath sounds bilaterally, no rales/rhonchi EHU:DJSH, obese, non-tender, PEG tube in situ Ext: No LE edema bilaterally  Imaging: DG Chest Port 1 View  Result Date:  06/30/2019 CLINICAL DATA:  Acute respiratory failure. EXAM: PORTABLE CHEST 1 VIEW COMPARISON:  06/26/2019. FINDINGS: Tracheostomy tube and right central line stable position. Cardiomegaly again noted without interim change. Diffuse bilateral pulmonary infiltrates/edema again noted. Improved aeration from prior exam. No pleural effusion or pneumothorax. IMPRESSION: 1.  Tracheostomy tube and central line stable position. 2.  Stable cardiomegaly. 3. Diffuse bilateral pulmonary infiltrates/edema again noted. Improved aeration from prior exam. Electronically Signed   By: Marcello Moores  Register   On: 06/30/2019 06:51    Labs: BMET Recent Labs  Lab 06/27/19 7026 06/27/19 1545 06/28/19 0518 06/28/19 1437 06/29/19 0334 06/29/19 1537 06/30/19 0252  NA 137 135 137 137 136 136 136  K 5.0 6.2* 4.7 4.6 4.5 4.9 3.7  CL 98 99 99 99 98 100 95*  CO2 22 21* 22 21* 20* 20* 24  GLUCOSE 146* 127* 96 99 105* 104* 126*  BUN 47* 45* 41* 42* 41* 42* 43*  CREATININE 2.17* 1.76* 1.51* 1.44* 1.43* 1.43* 1.53*  CALCIUM 9.4 8.7* 9.2 9.0 9.4 9.2 9.4  PHOS 3.4 3.4 2.7 2.8 2.5 2.6 3.1   CBC Recent Labs  Lab 06/27/19 0538 06/28/19 0518 06/29/19 0334 06/30/19 0252  WBC 13.4* 13.5* 13.4* 12.9*  HGB 8.2* 9.2* 9.4* 10.3*  HCT 28.1* 32.2* 32.4* 34.8*  MCV 105.6* 107.3* 104.5* 102.7*  PLT 259 298 320 346    Medications:    . ARIPiprazole  10 mg Per Tube Daily  . aspirin  81 mg Per Tube Daily  . atorvastatin  40 mg Per Tube q1800  .  chlorhexidine gluconate (MEDLINE KIT)  15 mL Mouth Rinse BID  . Chlorhexidine Gluconate Cloth  6 each Topical Q0600  . [START ON 07/04/2019] Darbepoetin Alfa  150 mcg Intravenous Q Mon-1800  . feeding supplement (PRO-STAT SUGAR FREE 64)  60 mL Per Tube TID  . FLUoxetine  20 mg Per Tube Daily  . free water  100 mL Per Tube Q4H  . insulin aspart  0-20 Units Subcutaneous Q4H  . insulin detemir  25 Units Subcutaneous BID  . mouth rinse  15 mL Mouth Rinse 10 times per day  . midodrine  10  mg Per Tube Q8H  . pantoprazole sodium  40 mg Per Tube Q1200  . polyethylene glycol  17 g Per Tube Daily  . QUEtiapine  50 mg Per Tube QHS  . sodium chloride flush  10-40 mL Intracatheter Q12H  . warfarin  7.5 mg Oral ONCE-1600  . Warfarin - Pharmacist Dosing Inpatient   Does not apply q1600   Elmarie Shiley, MD 06/30/2019, 7:11 AM

## 2019-06-30 NOTE — Progress Notes (Signed)
CRRT stopped, not able to return blood as filter clotted with no warning. Dr. Posey Pronto notified.

## 2019-06-30 NOTE — Progress Notes (Signed)
Alerted by secretary that pt was in pain. Went in to room to check on pt and pt had IV in his mouth, and peg tube in the bed detached. Notified CCM and applied restraints.

## 2019-07-01 ENCOUNTER — Inpatient Hospital Stay (HOSPITAL_COMMUNITY): Payer: No Typology Code available for payment source

## 2019-07-01 DIAGNOSIS — N179 Acute kidney failure, unspecified: Secondary | ICD-10-CM | POA: Diagnosis not present

## 2019-07-01 DIAGNOSIS — I639 Cerebral infarction, unspecified: Secondary | ICD-10-CM | POA: Diagnosis not present

## 2019-07-01 DIAGNOSIS — Z930 Tracheostomy status: Secondary | ICD-10-CM | POA: Diagnosis not present

## 2019-07-01 LAB — BASIC METABOLIC PANEL
Anion gap: 22 — ABNORMAL HIGH (ref 5–15)
BUN: 72 mg/dL — ABNORMAL HIGH (ref 6–20)
CO2: 20 mmol/L — ABNORMAL LOW (ref 22–32)
Calcium: 10.4 mg/dL — ABNORMAL HIGH (ref 8.9–10.3)
Chloride: 97 mmol/L — ABNORMAL LOW (ref 98–111)
Creatinine, Ser: 3.37 mg/dL — ABNORMAL HIGH (ref 0.61–1.24)
GFR calc Af Amer: 26 mL/min — ABNORMAL LOW (ref >60)
GFR calc non Af Amer: 22 mL/min — ABNORMAL LOW (ref >60)
Glucose, Bld: 78 mg/dL (ref 70–99)
Potassium: 3.8 mmol/L (ref 3.5–5.1)
Sodium: 139 mmol/L (ref 135–145)

## 2019-07-01 LAB — CBC
HCT: 38.3 % — ABNORMAL LOW (ref 39.0–52.0)
Hemoglobin: 11.2 g/dL — ABNORMAL LOW (ref 13.0–17.0)
MCH: 29.3 pg (ref 26.0–34.0)
MCHC: 29.2 g/dL — ABNORMAL LOW (ref 30.0–36.0)
MCV: 100.3 fL — ABNORMAL HIGH (ref 80.0–100.0)
Platelets: 351 10*3/uL (ref 150–400)
RBC: 3.82 MIL/uL — ABNORMAL LOW (ref 4.22–5.81)
RDW: 18.6 % — ABNORMAL HIGH (ref 11.5–15.5)
WBC: 12.5 10*3/uL — ABNORMAL HIGH (ref 4.0–10.5)
nRBC: 0.4 % — ABNORMAL HIGH (ref 0.0–0.2)

## 2019-07-01 LAB — GLUCOSE, CAPILLARY
Glucose-Capillary: 108 mg/dL — ABNORMAL HIGH (ref 70–99)
Glucose-Capillary: 123 mg/dL — ABNORMAL HIGH (ref 70–99)
Glucose-Capillary: 162 mg/dL — ABNORMAL HIGH (ref 70–99)
Glucose-Capillary: 72 mg/dL (ref 70–99)
Glucose-Capillary: 78 mg/dL (ref 70–99)
Glucose-Capillary: 93 mg/dL (ref 70–99)

## 2019-07-01 LAB — PROTIME-INR
INR: 1.7 — ABNORMAL HIGH (ref 0.8–1.2)
Prothrombin Time: 19.9 seconds — ABNORMAL HIGH (ref 11.4–15.2)

## 2019-07-01 LAB — MAGNESIUM: Magnesium: 3.1 mg/dL — ABNORMAL HIGH (ref 1.7–2.4)

## 2019-07-01 LAB — APTT: aPTT: 77 seconds — ABNORMAL HIGH (ref 24–36)

## 2019-07-01 MED ORDER — INSULIN DETEMIR 100 UNIT/ML ~~LOC~~ SOLN
15.0000 [IU] | Freq: Two times a day (BID) | SUBCUTANEOUS | Status: DC
  Administered 2019-07-01 – 2019-07-09 (×16): 15 [IU] via SUBCUTANEOUS
  Filled 2019-07-01 (×18): qty 0.15

## 2019-07-01 MED ORDER — WARFARIN SODIUM 10 MG PO TABS
10.0000 mg | ORAL_TABLET | Freq: Once | ORAL | Status: DC
  Filled 2019-07-01: qty 1

## 2019-07-01 MED ORDER — IOHEXOL 300 MG/ML  SOLN
50.0000 mL | Freq: Once | INTRAMUSCULAR | Status: AC | PRN
  Administered 2019-07-01: 40 mL

## 2019-07-01 MED ORDER — DEXTROSE 5 % IV SOLN: INTRAVENOUS | Status: DC

## 2019-07-01 MED ORDER — WARFARIN SODIUM 10 MG PO TABS
10.0000 mg | ORAL_TABLET | Freq: Once | ORAL | Status: AC
  Administered 2019-07-01: 10 mg
  Filled 2019-07-01: qty 1

## 2019-07-01 MED ORDER — ACETAMINOPHEN 325 MG PO TABS: 650.0000 mg | ORAL_TABLET | Freq: Four times a day (QID) | ORAL | Status: DC | PRN

## 2019-07-01 NOTE — Progress Notes (Signed)
Have updated pt's mother to delay in replacing PEG tube today and plan for utilizing existing foley tube if cleared by radiology ( per Dr. Elsworth Soho and IR). Mother expressed disappointment and states "Sometimes I dont think they care if he gets better.Marland KitchenMarland KitchenI wander if they want him to get worse. I have issues trusting now". Emotional support offered and offered for mother to speak with Dr. Elsworth Soho. She has declined. She expresses concern over utilizing foley for feeding and I will seek her consent after radiology confirms placement.

## 2019-07-01 NOTE — Progress Notes (Signed)
NAME:  Carlos Barthold., MRN:  [id number], DOB:  January 23, 1986, LOS: 34 ADMISSION DATE:  05/06/2019, CONSULTATION DATE:  05/09/2019 REFERRING MD:  Dr. Leonel Ramsay, CHIEF COMPLAINT:  Slurred speech  Brief History   34 yo male smoker found to have slurred speech and Rt sided weakness.  Admitted 2/5 with left MCA CVA and multiple smaller embolic infarcts>>>  tPA and thrombectomy in IR.  Additionally found to have left ventricular  thrombus.     Course complicated by septic shock r/t HCAP, AKI requiring CRRT, polymorphic VT and cardiogenic shock.  Intermittent pressor dependence, required tracheostomy Intermittent hemodialysis [place] CVA Heart failure with reduced ejection fraction < 15% on last echocardiogram Recurrent episodes of bilateral infiltrates and mechanical ventilation?  Aspiration pneumonitis  Past Medical History  Systolic CHF with non ischemic CM, Cocaine abuse, OSA, DM Hx of CHF (EF 15%)  Hx myocarditis Smoker 1/2 ppd   Significant Hospital Events   2/05 Admit, tPA, IR thrombectomy 2/6 Shock requiring increasing levophed and phenylephrine.   2/6: switched to levophed, epi, started antibiotics, started on CRRT.  2/9  developed wide-complex tachycardia and hypotension, started on amiodarone drip, increase Levophed drip 2/15 drop in platelets, heparin stopped, bival started 2/17 Hypotensive and back on Levophed 2/18 trach, lines changed 2/19 started milrinone , fever 103 2/20 Episode of wide-complex tachycardia yesterday, changed from Levophed to vasopressin 2/22 stopping vanc. Still on inotrope support. Some AF w/ RVR. Had to be placed back on pressors. ivabradine added 2/23 still on pressors/ CRRT continued 2/24 tmax 98.4, Remains on CRRT, even UF, remains anuric, Levophed at 14 mcg/min, Milrinone at 0.125 mcg/kg/min, Coox 63.4 Doing well this morning on ATC, no events overnight.  Midodrine added   2/25 more interactive, remains on ATC, tmax 99.5/ WBC 21.4, ~900 ml  liquid stool/ 24 hours, neg Cdiff, Levophed up to 20 mcg/min, CVP 2, ,milrinone remains at 0.125 mcg/kg/min, coox 92.7, Off CRRT since last night ~2000 s/p clotted x 3 off citrate, restarted  3/1 Amio drip restarted for WCT/atrial flutter, milrinone turned off 3/2 CRRT stopped, permacath placed 3/5 crrt resumed 3/6 started on pressors for crrt tolerance 3/8 No longer on CRRT or pressor support  3/12 Tolerated PS for >2 hours  3/17  febrile 106 overnight, shock on pressors, new lines placed, broad-spectrum antibiotics 3/22: Awake, appears comfortable on pressure support ventilation. weaning Precedex, he is back on low-dose norepinephrine 3/23 pm back on vent at hs for resp distress / desats ? chf on cxr  3/24 t collar 24/7 3/29 tolerating ATM without difficulty 3/31 tr back to ICU -trach changed to 6 cuffless, bronched, new fever, started on antibiotics, pneumomediastinum small biapical pneumothoraces-stable 4/1 CRRT transient 4/5 started back on HD tiw, pneumothorax, pneumomediastinum resolved. Active tracheal bleeding> Bival held 4/6 reduced amiodarone to 200 mg daily, bival resumed 4/7 Off levo, tolerating trach collar.  4/9 transfer out of ICU 4/10 desaturations, too "unastable" for HD from respiratory standpoint 4/11 send back to ICU for CRRT 4/13 Intermittent junctional bradycardia noted >> amio stopped  Consults:  Neuro IR Cardiology  Nephrology Heart Failure  EP   Procedures:  2/5-2/6:  TPA given at 428 am (total of 90 Mg) 520 am went to IR  S/P Lt common carotid arteriogram followed by complete revascularization of occluded LT MCA sup division mid M2 seg with x 1 pass with 67mx 40 mm solitaire X ret river device and penumbra aspiration with TICI 3 revascularization  ETT 2/05 >> 2/18 [facility] CVL  2/5 >>2/18  RT [place] CVL 2/18 >>3/2 RIJ HD cath 2/6 >>2/18 6 shiley cuffed trach 2/18  >>3/4  RT fem HD 2/19 >>3/2 RIJ permacath 3/2>> 3/4 shiley 4 uncuffed.... dislodged placed  back 6cuffed shiley that evening 3/5: change to #6 cuffed distal XLT  3/17 right femoral CVL >> 3/23 3/17 right femoral arterial line >>3/23 3/31 bronchoscopy : airway clear, conclusion = end of trach up against the wall obstructing airflow   3/31 trach change to cuffed #6 XLT  4/7 PEG by IR  Significant Diagnostic Tests:  CT angio head/neck 2/05 >> occlusion of Lt MCA bifurcation Echo 2/05 >> EF less than 20%, cannot rule out apical thrombus MRI brain 2/11 > extensive acute infarction of multiple areas without large or medium vessel occlusion Echocardiogram 2/8 Left ventricular ejection fraction, by estimation, is <20%. The left  ventricle has severely decreased function. The left ventricle demonstrates  global hypokinesis. The left ventricular internal cavity size was severely  dilated. Possible small 0.8 x 0.6 cm apical thrombus.  3/16 duplex upper and lower extremities >>neg  3/17 head CT >.  New area of hypoattenuation in the left pons , left frontal encephalomalacia related to old infarct 3/31CT chestreveals extensive pneumomediastinum and subcutaneous emphysema in the lower neck.  There is less than 5% bilateral pneumothoraces.  Extensive airspace disease right greater than left consistent with ARDS or infection 3/31 head CT >> neg for new pathology  Micro Data:  SARS CoV2 PCR 2/05 >> negative Influenza PCR 2/05 >> negative ... Cdiff 2/25 >> neg Trach asp 3/11>> Few candida albicans BC 3/11>> 1 of 4 with Coag neg staph  ...  trach 3/24 >  no  wbc, no organisms final - nl flora  Bronchoscopy 3/31  moderate GPC, normal respiratory flora Blood cultures 3/31 Neg   resp 4/11 >> nml flora Blood 4/12 >> ng  Antimicrobials:  mero 2/6 -2/12 Zosyn 2/6 , 2/19 >> 2/23 vanc 2/6 >> 2/9 , 2/19 >>2/22 Ceftriaxone 3/12> 3/16 3/17 vanc >> 3/19 3/17 Anidulafungin >> 3/19 3/17 cefepime >>  3/22  3/31 vancomycin>>4/3 3/31 cefepime>>4/3 Unasyn 4/3>>4/4  4/12 meropenem  >>4/14 4/12 vanc >>4/14  Interim history/subjective:  Lying in bed in no acute distress will track to voice but will no answer any questions or follow any commands.   Objective   Blood pressure 110/79, pulse 85, temperature 97.8 F (36.6 C), temperature source Oral, resp. rate (!) 27, height 6' 1"  (1.854 m), weight 98.3 kg, SpO2 98 %.    FiO2 (%):  [28 %] 28 %   Intake/Output Summary (Last 24 hours) at 07/01/2019 0912 Last data filed at 07/01/2019 0423 Gross per 24 hour  Intake 1215.17 ml  Output 105 ml  Net 1110.17 ml   Filed Weights   06/29/19 2252 06/30/19 0154 07/01/19 0420  Weight: 98 kg 98 kg 98.3 kg      Physical Exam.  General: Chronically ill appearing adult male on ATC, in NAD HEENT: [place]/AT, trach midline, no secretions seen, MM pink/moist, PERRL,  Neuro: Alert, able to trach to voice in room, unable to follow any commands  CV: s1s2 regular rate and rhythm, no murmur, rubs, or gallops,  PULM:  Clear to ascultation bilaterally, no added breath sounds, no increased work of breathing  GI: soft, bowel sounds active in all 4 quadrants, non-tender, non-distended, foley cath inserted though PEG site  Extremities: warm/dry, no edema  Skin: no rashes or lesions   Resolved Hospital Problem list  HCAP 1/77 Acute metabolic encephalopathy 2nd to hypoxia and renal failure Cardiogenic shock HIT  Mixed Cardiogenic Septic shock, present on admission Pneumomediastinum, pneumothoraces -resolved by pCXR 4/5  Hypotension -Levophed stopped again 4/14  Assessment & Plan:   Acute on chronic respiratory failure, Improving Tracheostomy with intermittent bleeding- improved -Cheyne stokes breathing resulting in desaturations (from CHF and stroke).  Hypoxia during this last episode related to volume overload rather than aspiration -XLT distal cuffed trach changed 4/14 P: Continue ATC  Routine trach care Pulmonary hygiene  End stage cardiomyopathy/chronic systolic heart  failure -Intermittent junctional bradycardia noted 4/13, Amio stopped  P: Continue hydralazine  Continuous telemetry  Cardiology seen during admission, signed off 4/3 as little left to offer  Patient is no a candidate for advanced heart failure therapies  Continued scheduled Midodrine   End stage renal failure -multifactorial etiology including ATN following cardiogenic/septic shock; anuric and without evidence of renal recovery -CRRT stopped again 4/15 P: Management per nephrology Attempt transition back to iHD  HAP  -with fever on 4/11, P: Antibiotics stopped again 4/14 Follow WBC and fever curve off antibiotics   HIT -INR 1.7 P: Continue coumadin and angiomax   Uncontrolled diabetes type 2 -better controlled  Hypoglycemia  -With holding of tube feeds due to accidental PEG tube removal hypoglycemia seen P: Hold Levemir while NPO SSI ABG checks q4 D5 drip while patient is NPO  Intermittent agitation -Pulled out PEG, will ask IR to reinsert P: Continue Seroquel qHS  Goals of care  Prognosis poor long-term.  D/w renal about limiting further CRRT use once we transition to intermittent dialysis  Best practice:  Diet: Tube feeds Pain/Anxiety/Delirium protocol (if indicated): PRNs VAP protocol (if indicated): N/A DVT prophylaxis: Coumadin and Angiomax GI prophylaxis: PPI Glucose control: SSI Mobility: Mobilize as able  Code Status: Full Family Communication: Updated  Disposition: ICU  Labs   CBC: Recent Labs  Lab 06/27/19 0538 06/28/19 0518 06/29/19 0334 06/30/19 0252 07/01/19 0630  WBC 13.4* 13.5* 13.4* 12.9* 12.5*  HGB 8.2* 9.2* 9.4* 10.3* 11.2*  HCT 28.1* 32.2* 32.4* 34.8* 38.3*  MCV 105.6* 107.3* 104.5* 102.7* 100.3*  PLT 259 298 320 346 939    Basic Metabolic Panel: Recent Labs  Lab 06/26/19 0458 06/26/19 2011 06/27/19 0538 06/27/19 1545 06/28/19 0518 06/28/19 0518 06/28/19 1437 06/29/19 0334 06/29/19 1537 06/30/19 0252  07/01/19 0630  NA  --    < > 137   < > 137   < > 137 136 136 136 139  K  --    < > 5.0   < > 4.7   < > 4.6 4.5 4.9 3.7 3.8  CL  --    < > 98   < > 99   < > 99 98 100 95* 97*  CO2  --    < > 22   < > 22   < > 21* 20* 20* 24 20*  GLUCOSE  --    < > 146*   < > 96   < > 99 105* 104* 126* 78  BUN  --    < > 47*   < > 41*   < > 42* 41* 42* 43* 72*  CREATININE  --    < > 2.17*   < > 1.51*   < > 1.44* 1.43* 1.43* 1.53* 3.37*  CALCIUM  --    < > 9.4   < > 9.2   < > 9.0 9.4 9.2 9.4 10.4*  MG  1.6*  --  2.3  --  2.4  --   --  2.6*  --   --  3.1*  PHOS  --    < > 3.4   < > 2.7  --  2.8 2.5 2.6 3.1  --    < > = values in this interval not displayed.   GFR: Estimated Creatinine Clearance: 37.8 mL/min (A) (by C-G formula based on SCr of 3.37 mg/dL (H)). Recent Labs  Lab 06/26/19 0458 06/26/19 0926 06/27/19 0538 06/27/19 0538 06/28/19 0518 06/29/19 0334 06/30/19 0252 07/01/19 0630  PROCALCITON  --  2.86 4.21  --  3.19  --   --   --   WBC   < >  --  13.4*   < > 13.5* 13.4* 12.9* 12.5*   < > = values in this interval not displayed.    Liver Function Tests: Recent Labs  Lab 06/28/19 0518 06/28/19 1437 06/29/19 0334 06/29/19 1537 06/30/19 0252  ALBUMIN 2.3* 2.5* 2.6* 2.4* 2.7*   No results for input(s): LIPASE, AMYLASE in the last 168 hours. No results for input(s): AMMONIA in the last 168 hours.  ABG    Component Value Date/Time   PHART 7.476 (H) 06/15/2019 1127   PCO2ART 33.7 06/15/2019 1127   PO2ART 426.0 (H) 06/15/2019 1127   HCO3 24.9 06/15/2019 1127   TCO2 26 06/15/2019 1127   ACIDBASEDEF 0.3 06/15/2019 0050   O2SAT 100.0 06/15/2019 1127     Coagulation Profile: Recent Labs  Lab 06/27/19 0538 06/28/19 0518 06/29/19 0334 06/30/19 0252 07/01/19 0630  INR 1.8* 1.6* 1.8* 1.2 1.7*    Cardiac Enzymes: No results for input(s): CKTOTAL, CKMB, CKMBINDEX, TROPONINI in the last 168 hours.  HbA1C: Hgb A1c MFr Bld  Date/Time Value Ref Range Status  04/23/2019 03:27 AM 6.5  (H) 4.8 - 5.6 % Final    Comment:    (NOTE) Pre diabetes:          5.7%-6.4% Diabetes:              >6.4% Glycemic control for   <7.0% adults with diabetes   05/04/2019 03:55 AM 6.5 (H) 4.8 - 5.6 % Final    Comment:    (NOTE) Pre diabetes:          5.7%-6.4% Diabetes:              >6.4% Glycemic control for   <7.0% adults with diabetes     CBG: Recent Labs  Lab 06/30/19 1940 06/30/19 2045 06/30/19 2329 07/01/19 0353 07/01/19 0721  GLUCAP 59* 120* 78 78 72      Signature:  Johnsie Cancel, NP-C [facility] Contact / Pager information can be found on Amion  07/01/2019, 9:15 AM

## 2019-07-01 NOTE — Progress Notes (Signed)
Pt w/o access for med admin...will reschedule when PEG replaced.

## 2019-07-01 NOTE — Progress Notes (Signed)
ANTICOAGULATION CONSULT NOTE  Pharmacy Consult:  Bivalirudin and Warfarin [place] Indication: stroke 2/5, LV thrombus 2/8, HIT 2/15  Allergies  Allergen Reactions  . Heparin Other (See Comments)    Heparin induced thrombocytopenia. 2/15 HIT OD 1.692. 2/16 SRA positive-90.     Patient Measurements: Height: 6\' 1"  (185.4 cm) Weight: 98.3 kg (216 lb 11.4 oz) IBW/kg (Calculated) : 79.9   Vital Signs: Temp: 97.8 F (36.6 C) (04/16 0700) Temp Source: Oral (04/16 0700) BP: 102/73 (04/16 0700) Pulse Rate: 94 (04/16 0700)  Labs: Recent Labs    06/29/19 0334 06/29/19 0334 06/29/19 1537 06/30/19 0252 06/30/19 2011 07/01/19 0630  HGB 9.4*   < >  --  10.3*  --  11.2*  HCT 32.4*  --   --  34.8*  --  38.3*  PLT 320  --   --  346  --  351  APTT 55*  --   --   --  63* 77*  LABPROT 21.0*  --   --  15.3*  --  19.9*  INR 1.8*  --   --  1.2  --  1.7*  CREATININE 1.43*  --  1.43* 1.53*  --   --    < > = values in this interval not displayed.    Estimated Creatinine Clearance: 83.2 mL/min (A) (by C-G formula based on SCr of 1.53 mg/dL (H)).  Assessment: 34 yr old male with history of ESRD on HD presented with left MCA infarct - received TPA and revascularization with IR on 2/5. On 2/8 found to have small LV apical thrombus on ECHO. Pharmacy consulted to dose IV heparin, which was switched to bivalirudin 2/15 for HIT. 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 SRA very positive at 11. Heparin allergy has appropriately been added to chart and updated with SRA results.   Transitioned off bivalrudin on 4/14 and restarted on 4/15. Of note, patient being a very difficult stick, due to extensive scarring after being stuck so many times with this prologed admission and previously refusing additional sticks. Checking daily labs with therapeutic levels. Per protocol, will stop bivalirudin when INR > 3.0 and check INR and aPTT 4-6 hr later.   APTT is therapeutic at 77  secs on 0.07 mg/kg/hr.  INR subtherapeutic at 1.7. Patient removed his PEG tube on 4/15, so is unable to get dose yesterday night (goal for tube to be replaced today). IR planning to stop bivalirudin near PEG procedure.  H/H improved, platelets WNL. Per RN, no issues with IV or bleeding observed.  Goal of Therapy:  aPTT 50-85 seconds INR 2-3 Monitor platelets by anticoagulation protocol: Yes   Plan:   Continue bivalirudin infusion at 0.07 mg/kg/hr Warfarin 10 mg X 1  Monitor daily aPTT, CBC, INR, BMET Monitor for s/sx of bleeding  Lorel Monaco, PharmD PGY1 Ambulatory Care Resident [employer] # [phone]

## 2019-07-01 NOTE — Progress Notes (Signed)
Admit: 04/25/2019 LOS: 70  34M originally presented with L MCA CVA s/p tPA and thrombectomy; lV Thrombus with sCHF/cardiogenic shock; VDRF req trach; additional CVA  Subjective:   . CRRT stopped yesterday . K 3.8, HCO3 20 this AM . Trach 28% FIO2 . Remains anuric . Has  HIT . PEG fell out yesterday  04/15 0701 - 04/16 0700 In: 1325.2 [I.V.:149.2; NG/GT:1176] Out: 303 [Urine:5; Stool:100]  Filed Weights   06/29/19 2252 06/30/19 0154 07/01/19 0420  Weight: 98 kg 98 kg 98.3 kg    Scheduled Meds: . ARIPiprazole  10 mg Per Tube Daily  . aspirin  81 mg Per Tube Daily  . atorvastatin  40 mg Per Tube q1800  . chlorhexidine gluconate (MEDLINE KIT)  15 mL Mouth Rinse BID  . Chlorhexidine Gluconate Cloth  6 each Topical Q0600  . [START ON 07/04/2019] Darbepoetin Alfa  150 mcg Intravenous Q Mon-1800  . feeding supplement (PRO-STAT SUGAR FREE 64)  60 mL Per Tube TID  . FLUoxetine  20 mg Per Tube Daily  . free water  100 mL Per Tube Q4H  . insulin aspart  0-20 Units Subcutaneous Q4H  . insulin detemir  15 Units Subcutaneous BID  . isosorbide-hydrALAZINE  1 tablet Per Tube BID  . mouth rinse  15 mL Mouth Rinse 10 times per day  . midodrine  10 mg Per Tube Q8H  . pantoprazole sodium  40 mg Per Tube Q1200  . QUEtiapine  50 mg Per Tube QHS  . sodium chloride flush  10-40 mL Intracatheter Q12H  . warfarin  10 mg Oral ONCE-1600  . Warfarin - Pharmacist Dosing Inpatient   Does not apply q1600   Continuous Infusions: . anticoagulant sodium citrate    . anticoagulant sodium citrate    . bivalirudin (ANGIOMAX) infusion 0.5 mg/mL (Non-ACS indications) 0.07 mg/kg/hr (07/01/19 0423)  . dextrose 20 mL/hr at 07/01/19 0829  . feeding supplement (VITAL 1.5 CAL) 55 mL/hr at 07/01/19 0423   PRN Meds:.acetaminophen, ALPRAZolam, anticoagulant sodium citrate, anticoagulant sodium citrate, bisacodyl, fentaNYL (SUBLIMAZE) injection, lidocaine (PF), lidocaine-prilocaine, lip balm, loperamide HCl,  ondansetron (ZOFRAN) IV, pentafluoroprop-tetrafluoroeth, polyethylene glycol, sodium chloride flush, white petrolatum  Current Labs: reviewed    Physical Exam:  Blood pressure 110/79, pulse 85, temperature 97.8 F (36.6 C), temperature source Oral, resp. rate (!) 27, height 6' 1" (1.854 m), weight 98.3 kg, SpO2 98 %. Awake, tracks L IJ TDC present RRR  CTAB No sig LEE S/nt  A 1. Dialysis dependent AKI, anuric 2. Prolonged VDRF, trach present 3. S/p CVA at admission 4. Chronic sCHF 5. LV Thrombus 6. HIT on bivalirudin 7. Polymorphic VT 8. Anemia ESA qTues, Hb stable 9. CKD-BMD; stable #s  P . Resume HD on THS schedule, next tomorrow in ICU: 2K 4h TDC 400/600, no heparin, UF 2L . Medication Issues; o Preferred narcotic agents for pain control are hydromorphone, fentanyl, and methadone. Morphine should not be used.  o Baclofen should be avoided o Avoid oral sodium phosphate and magnesium citrate based laxatives / bowel preps      MD 07/01/2019, 9:25 AM  Recent Labs  Lab 06/29/19 0334 06/29/19 0334 06/29/19 1537 06/30/19 0252 07/01/19 0630  NA 136   < > 136 136 139  K 4.5   < > 4.9 3.7 3.8  CL 98   < > 100 95* 97*  CO2 20*   < > 20* 24 20*  GLUCOSE 105*   < > 104* 126* 78  BUN 41*   < >   42* 43* 72*  CREATININE 1.43*   < > 1.43* 1.53* 3.37*  CALCIUM 9.4   < > 9.2 9.4 10.4*  PHOS 2.5  --  2.6 3.1  --    < > = values in this interval not displayed.   Recent Labs  Lab 06/29/19 0334 06/30/19 0252 07/01/19 0630  WBC 13.4* 12.9* 12.5*  HGB 9.4* 10.3* 11.2*  HCT 32.4* 34.8* 38.3*  MCV 104.5* 102.7* 100.3*  PLT 320 346 351             

## 2019-07-01 NOTE — Progress Notes (Signed)
eLink Physician-Brief Progress Note Patient Name: Carlos Brewer. DOB: 1985/10/03 MRN: [id number]   Date of Service  07/01/2019  HPI/Events of Note  Review of abdominal film: There is contrast injection of a percutaneous gastrostomy tube, with intraluminal contrast identified in stomach. There is no evident extraluminal contrast on single supine radiograph.   eICU Interventions  OK to use gastric tube.     Intervention Category Intermediate Interventions: Diagnostic test evaluation  [name] 07/01/2019, 7:35 PM

## 2019-07-01 NOTE — Progress Notes (Signed)
  Speech Language Pathology Treatment: Dysphagia;Nada Boozer Speaking valve;Cognitive-Linquistic  Patient Details Name: Carlos Brewer. MRN: [id number] DOB: 04-02-1985 Today's Date: 07/01/2019 Time: 1437-1500 SLP Time Calculation (min) (ACUTE ONLY): 23 min  Assessment / Plan / Recommendation Clinical Impression  Pt was participatory during skilled intervention targeting aphasia, apraxia, dysphagia and use of PMV. Pt sitting in chair with mom at bedside wearing PMV with RR 20, SpO2 100% and HR 90. No audible secretions or pharyngeal/laryngeal congestion detected with patent upper airway.  He was distractible but redirectable during speech production tasks using various combination of techniques with automatic speech tasks, melodic intonation therapy and tactile assist for labial placement. [name] produced spontaneous sigh, moan x 3 during session. No labial movement during familiar utterances/melodies. He is able to open mouth slightly when requested but keeps lips adducted when at rest. He became more animated when therapist pointed out pictures and spoke about his children- appeared to mouth a word and smiling. Mom generated biographical information re: pt to use in future therapy sessions.  After oral care, trials ice chip then tsp sips water given. Masticated ice and suspect delayed swallow initiation - no overt coughing or throat clearing which was expected given clinical presentation.He is getting closer to repeating an instrumental swallow assessment. In speaking with RN and SLP colleague who worked with him prior, his participation/behavior waxes and wanes. Continue oral care.     HPI HPI: Pt is a 34 y/o male smoker who initially presented on 2/5 with slurred speech and Rt sided weakness. Admitted with left MCA CVA and multiple smaller embolic infarcts s/p tPA and thrombectomy in IR.  CT head 3/31: subacute to chronic infarctions of the frontal lobes, right cerebellar hemisphere, and  pons.  Additionally found to have left ventricular  thrombus. Hospital course complicated by septic shock r/t HCAP, AKI requiring CRRT, polymorphic VT and cardiogenic shock. Echo EF < 20%. Pt with Intermittent pressor dependence, required tracheostomy 2/18.  Has been in and out of ICU and has required intermittent vent support. G-tube placed 06/23/19. Sent back to ICU 4/11 for CRRT.       SLP Plan  Continue with current plan of care       Recommendations  Diet recommendations: NPO Medication Administration: Via alternative means      Patient may use Passy-Muir Speech Valve: During all waking hours (remove during sleep) PMSV Supervision: Full MD: Please consider changing trach tube to : Smaller size;Cuffless         Oral Care Recommendations: Oral care QID Follow up Recommendations: Inpatient Rehab(inpt rehab??) SLP Visit Diagnosis: Aphasia (R47.01);Aphonia (R49.1);Apraxia (R48.2);Cognitive communication deficit (R41.841);Dysphagia, unspecified (R13.10) Plan: Continue with current plan of care       GO                Houston Siren 07/01/2019, 3:44 PM  [name].Ed Risk analyst [phone] Office [phone]

## 2019-07-01 NOTE — Progress Notes (Signed)
Physical Therapy Treatment Patient Details Name: Carlos Brewer. MRN: [id number] DOB: 10-22-1985 Today's Date: 07/01/2019    History of Present Illness Pt is a 34 y/o male smoker who initially presented on 2/5 with slurred speech and Rt sided weakness. Admitted with left MCA CVA and multiple smaller embolic infarcts s/p tPA and thrombectomy in IR.  Additionally found to have left ventricular  thrombus. Hospital course complicated by septic shock r/t HCAP, AKI requiring CRRT, polymorphic VT and cardiogenic shock. Echo EF < 20%. Pt with Intermittent pressor dependence, required tracheostomy. Tolerating trach collar 3/4, back on vent 3/5 with flash pulmonary edema.  Started IHD 3/10.  G-tube placed 06/23/19.    PT Comments    Pt was able to focus on task today, though not as well as last session. Emphasis continues to be strengthening, transitions, sitting balance, sit to stand and transfers to the chair.    Follow Up Recommendations  LTACH     Equipment Recommendations  Other (comment)(TBA)    Recommendations for Other Services       Precautions / Restrictions Precautions Precautions: Fall Precaution Comments: trach collar, PEG going to sx for another PEG    Mobility  Bed Mobility Overal bed mobility: Needs Assistance Bed Mobility: Supine to Sit     Supine to sit: Max assist;+2 for safety/equipment     General bed mobility comments: still needing directional cues and significant truncal assist in flexion.vs extention  Transfers Overall transfer level: Needs assistance   Transfers: Sit to/from Stand Sit to Stand: Max assist;+2 physical assistance Stand pivot transfers: Max assist;+2 safety/equipment       General transfer comment: pt not participative with less initiation than last session, but he did follow directional cues and stand at EOB  Ambulation/Gait             General Gait Details: unable   Stairs             Wheelchair Mobility     Modified Rankin (Stroke Patients Only) Modified Rankin (Stroke Patients Only) Modified Rankin: Severe disability     Balance Overall balance assessment: Needs assistance   Sitting balance-Leahy Scale: Poor Sitting balance - Comments: worked on balance.  pt tends to list posteriorly and not be able to use abs sufficiently to return to midline. Postural control: Posterior lean;Right lateral lean Standing balance support: Bilateral upper extremity supported Standing balance-Leahy Scale: Poor Standing balance comment: pt stood x1 and attain upright posture with struggle.                            Cognition Arousal/Alertness: Awake/alert Behavior During Therapy: Flat affect Overall Cognitive Status: Impaired/Different from baseline Area of Impairment: Attention;Following commands;Safety/judgement;Awareness;Problem solving                   Current Attention Level: Sustained   Following Commands: Follows one step commands with increased time;Follows one step commands inconsistently Safety/Judgement: Decreased awareness of safety;Decreased awareness of deficits Awareness: Intellectual Problem Solving: Slow processing;Decreased initiation        Exercises Other Exercises Other Exercises: resisted hip/knee flex/ext bil x10 to warm up.    General Comments        Pertinent Vitals/Pain Pain Assessment: Faces Faces Pain Scale: Hurts a little bit Pain Intervention(s): Monitored during session    Home Living                      Prior  Function            PT Goals (current goals can now be found in the care plan section) Acute Rehab PT Goals Patient Stated Goal: to d/c home with mother after rehab PT Goal Formulation: With patient/family Time For Goal Achievement: 07/04/19 Potential to Achieve Goals: Fair Progress towards PT goals: Progressing toward goals    Frequency    Min 3X/week      PT Plan Current plan remains appropriate     Co-evaluation              AM-PAC PT "6 Clicks" Mobility   Outcome Measure  Help needed turning from your back to your side while in a flat bed without using bedrails?: Total Help needed moving from lying on your back to sitting on the side of a flat bed without using bedrails?: Total Help needed moving to and from a bed to a chair (including a wheelchair)?: Total Help needed standing up from a chair using your arms (e.g., wheelchair or bedside chair)?: Total Help needed to walk in hospital room?: Total Help needed climbing 3-5 steps with a railing? : Total 6 Click Score: 6    End of Session Equipment Utilized During Treatment: Oxygen Activity Tolerance: Patient limited by fatigue;Patient tolerated treatment well Patient left: in chair;with call bell/phone within reach;with chair alarm set;with family/visitor present;Other (comment)(on lift pad.) Nurse Communication: Mobility status PT Visit Diagnosis: Other abnormalities of gait and mobility (R26.89);Other symptoms and signs involving the nervous system (R29.898);Muscle weakness (generalized) (M62.81)     Time: 8676-7209 PT Time Calculation (min) (ACUTE ONLY): 30 min  Charges:  $Therapeutic Activity: 8-22 mins $Neuromuscular Re-education: 8-22 mins                     07/01/2019  Ginger Carne., PT Acute Rehabilitation Services [phone]  (pager) [phone]  (office)   Tessie Fass Asad Keeven 07/01/2019, 5:13 PM

## 2019-07-02 ENCOUNTER — Inpatient Hospital Stay (HOSPITAL_COMMUNITY): Payer: No Typology Code available for payment source

## 2019-07-02 DIAGNOSIS — Z930 Tracheostomy status: Secondary | ICD-10-CM | POA: Diagnosis not present

## 2019-07-02 DIAGNOSIS — J9601 Acute respiratory failure with hypoxia: Secondary | ICD-10-CM | POA: Diagnosis not present

## 2019-07-02 DIAGNOSIS — I639 Cerebral infarction, unspecified: Secondary | ICD-10-CM | POA: Diagnosis not present

## 2019-07-02 LAB — GLUCOSE, CAPILLARY
Glucose-Capillary: 157 mg/dL — ABNORMAL HIGH (ref 70–99)
Glucose-Capillary: 107 mg/dL — ABNORMAL HIGH (ref 70–99)
Glucose-Capillary: 111 mg/dL — ABNORMAL HIGH (ref 70–99)
Glucose-Capillary: 140 mg/dL — ABNORMAL HIGH (ref 70–99)
Glucose-Capillary: 162 mg/dL — ABNORMAL HIGH (ref 70–99)

## 2019-07-02 LAB — BASIC METABOLIC PANEL
Anion gap: 21 — ABNORMAL HIGH (ref 5–15)
BUN: 104 mg/dL — ABNORMAL HIGH (ref 6–20)
CO2: 20 mmol/L — ABNORMAL LOW (ref 22–32)
Calcium: 9.9 mg/dL (ref 8.9–10.3)
Chloride: 96 mmol/L — ABNORMAL LOW (ref 98–111)
Creatinine, Ser: 4.74 mg/dL — ABNORMAL HIGH (ref 0.61–1.24)
GFR calc Af Amer: 17 mL/min — ABNORMAL LOW (ref >60)
GFR calc non Af Amer: 15 mL/min — ABNORMAL LOW (ref >60)
Glucose, Bld: 138 mg/dL — ABNORMAL HIGH (ref 70–99)
Potassium: 3.8 mmol/L (ref 3.5–5.1)
Sodium: 137 mmol/L (ref 135–145)

## 2019-07-02 LAB — PROTIME-INR
INR: 2 — ABNORMAL HIGH (ref 0.8–1.2)
Prothrombin Time: 22.7 seconds — ABNORMAL HIGH (ref 11.4–15.2)

## 2019-07-02 LAB — CBC
HCT: 37.6 % — ABNORMAL LOW (ref 39.0–52.0)
Hemoglobin: 11 g/dL — ABNORMAL LOW (ref 13.0–17.0)
MCH: 29.5 pg (ref 26.0–34.0)
MCHC: 29.3 g/dL — ABNORMAL LOW (ref 30.0–36.0)
MCV: 100.8 fL — ABNORMAL HIGH (ref 80.0–100.0)
Platelets: 381 10*3/uL (ref 150–400)
RBC: 3.73 MIL/uL — ABNORMAL LOW (ref 4.22–5.81)
RDW: 18 % — ABNORMAL HIGH (ref 11.5–15.5)
WBC: 10.5 10*3/uL (ref 4.0–10.5)
nRBC: 0.4 % — ABNORMAL HIGH (ref 0.0–0.2)

## 2019-07-02 LAB — APTT: aPTT: 76 seconds — ABNORMAL HIGH (ref 24–36)

## 2019-07-02 LAB — CULTURE, BLOOD (ROUTINE X 2): Culture: NO GROWTH

## 2019-07-02 LAB — MAGNESIUM: Magnesium: 3.3 mg/dL — ABNORMAL HIGH (ref 1.7–2.4)

## 2019-07-02 MED ORDER — DEXTROSE 10 % IV SOLN: INTRAVENOUS | Status: DC

## 2019-07-02 MED ORDER — VITAL 1.5 CAL PO LIQD
1000.0000 mL | ORAL | Status: AC
  Administered 2019-07-02 – 2019-07-04 (×2): 1000 mL
  Filled 2019-07-02 (×5): qty 1000

## 2019-07-02 MED ORDER — WARFARIN SODIUM 7.5 MG PO TABS: 7.5000 mg | ORAL_TABLET | Freq: Once | ORAL | Status: DC

## 2019-07-02 MED ORDER — ALBUMIN HUMAN 25 % IV SOLN
25.0000 g | Freq: Once | INTRAVENOUS | Status: AC
  Administered 2019-07-02: 25 g via INTRAVENOUS
  Filled 2019-07-02: qty 100

## 2019-07-02 MED ORDER — ACETAMINOPHEN 650 MG RE SUPP: 650.0000 mg | RECTAL | Status: DC | PRN

## 2019-07-02 MED ORDER — WARFARIN SODIUM 7.5 MG PO TABS
7.5000 mg | ORAL_TABLET | Freq: Once | ORAL | Status: AC
  Administered 2019-07-02: 7.5 mg
  Filled 2019-07-02: qty 1

## 2019-07-02 NOTE — Progress Notes (Signed)
PCCM Interval Note  Tolerated hemodialysis today. Will transfer to [facility] 07/03/19 as primary service. PCCM will continue to intermittently follow for trach care. Handoff given to Dr. Candiss Norse.   Rodman Pickle, M.D. [facility] Pulmonary/Critical Care Medicine 07/02/2019 4:13 PM

## 2019-07-02 NOTE — Progress Notes (Signed)
NAME:  Carlos Knutzen., MRN:  [id number], DOB:  [birth year], LOS: 5 ADMISSION DATE:  04/25/2019, CONSULTATION DATE:  05/12/2019 REFERRING MD:  Dr. Leonel Ramsay, CHIEF COMPLAINT:  Slurred speech  Brief History   34 yo male smoker found to have slurred speech and Rt sided weakness.  Admitted 2/5 with left MCA CVA and multiple smaller embolic infarcts>>>  tPA and thrombectomy in IR.  Additionally found to have left ventricular  thrombus.     Course complicated by septic shock r/t HCAP, AKI requiring CRRT, polymorphic VT and cardiogenic shock.  Intermittent pressor dependence, required tracheostomy Intermittent hemodialysis [place] CVA Heart failure with reduced ejection fraction < 15% on last echocardiogram Recurrent episodes of bilateral infiltrates and mechanical ventilation?  Aspiration pneumonitis  Past Medical History  Systolic CHF with non ischemic CM, Cocaine abuse, OSA, DM Hx of CHF (EF 15%)  Hx myocarditis Smoker 1/2 ppd   Significant Hospital Events   2/05 Admit, tPA, IR thrombectomy 2/6 Shock requiring increasing levophed and phenylephrine.   2/6: switched to levophed, epi, started antibiotics, started on CRRT.  2/9  developed wide-complex tachycardia and hypotension, started on amiodarone drip, increase Levophed drip 2/15 drop in platelets, heparin stopped, bival started 2/17 Hypotensive and back on Levophed 2/18 trach, lines changed 2/19 started milrinone , fever 103 2/20 Episode of wide-complex tachycardia yesterday, changed from Levophed to vasopressin 2/22 stopping vanc. Still on inotrope support. Some AF w/ RVR. Had to be placed back on pressors. ivabradine added 2/23 still on pressors/ CRRT continued 2/24 tmax 98.4, Remains on CRRT, even UF, remains anuric, Levophed at 14 mcg/min, Milrinone at 0.125 mcg/kg/min, Coox 63.4 Doing well this morning on ATC, no events overnight.  Midodrine added   2/25 more interactive, remains on ATC, tmax 99.5/ WBC 21.4, ~900 ml  liquid stool/ 24 hours, neg Cdiff, Levophed up to 20 mcg/min, CVP 2, ,milrinone remains at 0.125 mcg/kg/min, coox 92.7, Off CRRT since last night ~2000 s/p clotted x 3 off citrate, restarted  3/1 Amio drip restarted for WCT/atrial flutter, milrinone turned off 3/2 CRRT stopped, permacath placed 3/5 crrt resumed 3/6 started on pressors for crrt tolerance 3/8 No longer on CRRT or pressor support  3/12 Tolerated PS for >2 hours  3/17  febrile 106 overnight, shock on pressors, new lines placed, broad-spectrum antibiotics 3/22: Awake, appears comfortable on pressure support ventilation. weaning Precedex, he is back on low-dose norepinephrine 3/23 pm back on vent at hs for resp distress / desats ? chf on cxr  3/24 t collar 24/7 3/29 tolerating ATM without difficulty 3/31 tr back to ICU -trach changed to 6 cuffless, bronched, new fever, started on antibiotics, pneumomediastinum small biapical pneumothoraces-stable 4/1 CRRT transient 4/5 started back on HD tiw, pneumothorax, pneumomediastinum resolved. Active tracheal bleeding> Bival held 4/6 reduced amiodarone to 200 mg daily, bival resumed 4/7 Off levo, tolerating trach collar.  4/9 transfer out of ICU 4/10 desaturations, too "unastable" for HD from respiratory standpoint 4/11 send back to ICU for CRRT 4/13 Intermittent junctional bradycardia noted >> amio stopped  Consults:  Neuro IR Cardiology  Nephrology Heart Failure  EP   Procedures:  2/5-2/6:  TPA given at 428 am (total of 90 Mg) 520 am went to IR  S/P Lt common carotid arteriogram followed by complete revascularization of occluded LT MCA sup division mid M2 seg with x 1 pass with 17mx 40 mm solitaire X ret river device and penumbra aspiration with TICI 3 revascularization  ETT 2/05 >> 2/18 LIJ CVL  2/5 >>2/18  RT [place] CVL 2/18 >>3/2 RIJ HD cath 2/6 >>2/18 6 shiley cuffed trach 2/18  >>3/4  RT fem HD 2/19 >>3/2 RIJ permacath 3/2>> 3/4 shiley 4 uncuffed.... dislodged placed  back 6cuffed shiley that evening 3/5: change to #6 cuffed distal XLT  3/17 right femoral CVL >> 3/23 3/17 right femoral arterial line >>3/23 3/31 bronchoscopy : airway clear, conclusion = end of trach up against the wall obstructing airflow   3/31 trach change to cuffed #6 XLT  4/7 PEG by IR  Significant Diagnostic Tests:  CT angio head/neck 2/05 >> occlusion of Lt MCA bifurcation Echo 2/05 >> EF less than 20%, cannot rule out apical thrombus MRI brain 2/11 > extensive acute infarction of multiple areas without large or medium vessel occlusion Echocardiogram 2/8 Left ventricular ejection fraction, by estimation, is <20%. The left  ventricle has severely decreased function. The left ventricle demonstrates  global hypokinesis. The left ventricular internal cavity size was severely  dilated. Possible small 0.8 x 0.6 cm apical thrombus.  3/16 duplex upper and lower extremities >>neg  3/17 head CT >.  New area of hypoattenuation in the left pons , left frontal encephalomalacia related to old infarct 3/31CT chestreveals extensive pneumomediastinum and subcutaneous emphysema in the lower neck.  There is less than 5% bilateral pneumothoraces.  Extensive airspace disease right greater than left consistent with ARDS or infection 3/31 head CT >> neg for new pathology  Micro Data:  SARS CoV2 PCR 2/05 >> negative Influenza PCR 2/05 >> negative ... Cdiff 2/25 >> neg Trach asp 3/11>> Few candida albicans BC 3/11>> 1 of 4 with Coag neg staph  ...  trach 3/24 >  no  wbc, no organisms final - nl flora  Bronchoscopy 3/31  moderate GPC, normal respiratory flora Blood cultures 3/31 Neg   resp 4/11 >> nml flora Blood 4/12 >> ng  Antimicrobials:  mero 2/6 -2/12 Zosyn 2/6 , 2/19 >> 2/23 vanc 2/6 >> 2/9 , 2/19 >>2/22 Ceftriaxone 3/12> 3/16 3/17 vanc >> 3/19 3/17 Anidulafungin >> 3/19 3/17 cefepime >>  3/22  3/31 vancomycin>>4/3 3/31 cefepime>>4/3 Unasyn 4/3>>4/4  4/12 meropenem  >>4/14 4/12 vanc >>4/14  Interim history/subjective:  Tolerating trach collar. Currently on HD. No acute distress. Follows simple commands however does not appropriately answer questions.   Objective   Blood pressure 111/76, pulse (!) 106, temperature (!) 97.5 F (36.4 C), temperature source Oral, resp. rate 20, height 6' 1"  (1.854 m), weight 97.1 kg, SpO2 99 %.    Vent Mode: Spontaneous FiO2 (%):  [28 %] 28 %   Intake/Output Summary (Last 24 hours) at 07/02/2019 0820 Last data filed at 07/02/2019 0600 Gross per 24 hour  Intake 990.73 ml  Output 445 ml  Net 545.73 ml   Filed Weights   07/01/19 0420 07/02/19 0500 07/02/19 0700  Weight: 98.3 kg 95.2 kg 97.1 kg     Physical Exam: General: Chronically ill-appearing, no acute distress HENT: [place], AT, OP clear, MMM Neck: Trach in place, c/d/i Eyes: EOMI, no scleral icterus Respiratory: Clear to auscultation bilaterally.  No crackles, wheezing or rales Cardiovascular: RRR, -M/R/G, no JVD GI: BS+, soft, nontender, foley in place of PEG site and retracted Extremities:-Edema,-tenderness Neuro: Awake, tracks, follows simples commands, moves extremities x 4   Resolved Hospital Problem list   HCAP 9/82 Acute metabolic encephalopathy 2nd to hypoxia and renal failure Cardiogenic shock HIT  Mixed Cardiogenic Septic shock, present on admission Pneumomediastinum, pneumothoraces -resolved by pCXR 4/5  Hypotension -Levophed stopped  again 4/14  Assessment & Plan:   Acute on chronic respiratory failure - tolerating trach collar since 4/4 Tracheostomy with intermittent bleeding- improved -XLT distal cuffed trach changed 4/14 P: Continue ATC Routine trach care Pulmonary hygiene  End stage cardiomyopathy/chronic systolic heart failure -Intermittent junctional bradycardia noted 4/13, Amio stopped  P: Continue hydralazine  Continuous telemetry  Cardiology seen during admission, signed off 4/3 as little left to offer  Patient is no a  candidate for advanced heart failure therapies  Continued scheduled Midodrine   End stage renal failure -multifactorial etiology including ATN following cardiogenic/septic shock; anuric and without evidence of renal recovery -CRRT stopped again 4/15. Tolerating HD today P: Management per nephrology iHD as tolerated  HIT -INR 1.7 P: Continue coumadin and angiomax   Uncontrolled diabetes type 2 -better controlled  Hypoglycemia  -With holding of tube feeds due to accidental PEG tube removal hypoglycemia seen P: Hold Levemir while NPO SSI ABG checks q4 D5 drip while patient is NPO  Intermittent agitation -Pulled out PEG, will ask IR to reinsert or have cortrak place enteral access P: Continue Seroquel qHS  Goals of care  Prognosis poor long-term.  D/w renal about limiting further CRRT use once we transition to intermittent dialysis  Best practice:  Diet: Holding tube feeds due to retracted foley. Will obtain enteral access via Dobhoff if foley/PEG retracted too far Pain/Anxiety/Delirium protocol (if indicated): PRNs VAP protocol (if indicated): N/A DVT prophylaxis: Coumadin and Angiomax GI prophylaxis: PPI Glucose control: SSI Mobility: Mobilize as able  Code Status: Full Family Communication: Updated  Disposition: ICU  Labs   CBC: Recent Labs  Lab 06/28/19 0518 06/29/19 0334 06/30/19 0252 07/01/19 0630 07/02/19 0342  WBC 13.5* 13.4* 12.9* 12.5* 10.5  HGB 9.2* 9.4* 10.3* 11.2* 11.0*  HCT 32.2* 32.4* 34.8* 38.3* 37.6*  MCV 107.3* 104.5* 102.7* 100.3* 100.8*  PLT 298 320 346 351 891    Basic Metabolic Panel: Recent Labs  Lab 06/27/19 0538 06/27/19 1545 06/28/19 0518 06/28/19 0518 06/28/19 1437 06/28/19 1437 06/29/19 0334 06/29/19 1537 06/30/19 0252 07/01/19 0630 07/02/19 0342  NA 137   < > 137   < > 137   < > 136 136 136 139 137  K 5.0   < > 4.7   < > 4.6   < > 4.5 4.9 3.7 3.8 3.8  CL 98   < > 99   < > 99   < > 98 100 95* 97* 96*  CO2 22   < > 22    < > 21*   < > 20* 20* 24 20* 20*  GLUCOSE 146*   < > 96   < > 99   < > 105* 104* 126* 78 138*  BUN 47*   < > 41*   < > 42*   < > 41* 42* 43* 72* 104*  CREATININE 2.17*   < > 1.51*   < > 1.44*   < > 1.43* 1.43* 1.53* 3.37* 4.74*  CALCIUM 9.4   < > 9.2   < > 9.0   < > 9.4 9.2 9.4 10.4* 9.9  MG 2.3  --  2.4  --   --   --  2.6*  --   --  3.1* 3.3*  PHOS 3.4   < > 2.7  --  2.8  --  2.5 2.6 3.1  --   --    < > = values in this interval not displayed.   GFR: Estimated Creatinine Clearance:  26.7 mL/min (A) (by C-G formula based on SCr of 4.74 mg/dL (H)). Recent Labs  Lab 06/26/19 0458 06/26/19 0926 06/27/19 0538 06/27/19 0538 06/28/19 0518 06/28/19 0518 06/29/19 0334 06/30/19 0252 07/01/19 0630 07/02/19 0342  PROCALCITON  --  2.86 4.21  --  3.19  --   --   --   --   --   WBC   < >  --  13.4*   < > 13.5*   < > 13.4* 12.9* 12.5* 10.5   < > = values in this interval not displayed.    Liver Function Tests: Recent Labs  Lab 06/28/19 0518 06/28/19 1437 06/29/19 0334 06/29/19 1537 06/30/19 0252  ALBUMIN 2.3* 2.5* 2.6* 2.4* 2.7*   No results for input(s): LIPASE, AMYLASE in the last 168 hours. No results for input(s): AMMONIA in the last 168 hours.  ABG    Component Value Date/Time   PHART 7.476 (H) 06/15/2019 1127   PCO2ART 33.7 06/15/2019 1127   PO2ART 426.0 (H) 06/15/2019 1127   HCO3 24.9 06/15/2019 1127   TCO2 26 06/15/2019 1127   ACIDBASEDEF 0.3 06/15/2019 0050   O2SAT 100.0 06/15/2019 1127     Coagulation Profile: Recent Labs  Lab 06/28/19 0518 06/29/19 0334 06/30/19 0252 07/01/19 0630 07/02/19 0342  INR 1.6* 1.8* 1.2 1.7* 2.0*    Cardiac Enzymes: No results for input(s): CKTOTAL, CKMB, CKMBINDEX, TROPONINI in the last 168 hours.  HbA1C: Hgb A1c MFr Bld  Date/Time Value Ref Range Status  04/23/2019 03:27 AM 6.5 (H) 4.8 - 5.6 % Final    Comment:    (NOTE) Pre diabetes:          5.7%-6.4% Diabetes:              >6.4% Glycemic control for    <7.0% adults with diabetes   05/14/2019 03:55 AM 6.5 (H) 4.8 - 5.6 % Final    Comment:    (NOTE) Pre diabetes:          5.7%-6.4% Diabetes:              >6.4% Glycemic control for   <7.0% adults with diabetes     CBG: Recent Labs  Lab 07/01/19 1535 07/01/19 1937 07/01/19 2352 07/02/19 0346 07/02/19 0720  GLUCAP 123* 108* 162* 157* 107*      Signature:   Rodman Pickle, M.D. [facility] Pulmonary/Critical Care Medicine 07/02/2019 8:48 AM

## 2019-07-02 NOTE — Progress Notes (Addendum)
Gastric tube noted to be 1 1/2-2" further out than earlier. Tube feed held, more tape applied to better secure tube and Elink made aware. Will continue to monitor.

## 2019-07-02 NOTE — Progress Notes (Signed)
eLink Physician-Brief Progress Note Patient Name: Carlos Brewer. DOB: 09/07/1985 MRN: [id number]   Date of Service  07/02/2019  HPI/Events of Note  Patient has pulled out his NGT twice today. Nursing unable to replace NGT to to patient combativeness. Many of his current enteral medications have no IV equivalents and will need to be held.   eICU Interventions  Will order: 1. D/C Tylenol per tube.  2. Tylenol Suppository 650 mg PR Q 6 hours PRN Temp > 101.0 F.  3. D10W to run IV at 20 mL/hour. 4. Primary rounding team to address replacement of enteral tube in AM.      Intervention Category Major Interventions: Other:  [name] 07/02/2019, 10:57 PM

## 2019-07-02 NOTE — Progress Notes (Signed)
ANTICOAGULATION CONSULT NOTE  Pharmacy Consult:  Bivalirudin and Warfarin [place] Indication: stroke 2/5, LV thrombus 2/8, HIT 2/15  Allergies  Allergen Reactions  . Heparin Other (See Comments)    Heparin induced thrombocytopenia. 2/15 HIT OD 1.692. 2/16 SRA positive-90.     Patient Measurements: Height: 6\' 1"  (185.4 cm) Weight: 97.1 kg (214 lb 1.1 oz) IBW/kg (Calculated) : 79.9   Vital Signs: Temp: 97.5 F (36.4 C) (04/17 0721) Temp Source: Oral (04/17 0721) BP: 121/73 (04/17 0815) Pulse Rate: 104 (04/17 0815)  Labs: Recent Labs    06/30/19 0252 06/30/19 0252 06/30/19 2011 07/01/19 0630 07/02/19 0342  HGB 10.3*   < >  --  11.2* 11.0*  HCT 34.8*  --   --  38.3* 37.6*  PLT 346  --   --  351 381  APTT  --   --  63* 77* 76*  LABPROT 15.3*  --   --  19.9* 22.7*  INR 1.2  --   --  1.7* 2.0*  CREATININE 1.53*  --   --  3.37* 4.74*   < > = values in this interval not displayed.    Estimated Creatinine Clearance: 26.7 mL/min (A) (by C-G formula based on SCr of 4.74 mg/dL (H)).  Assessment: 34 yr old male with history of ESRD on HD presented with left MCA infarct - received TPA and revascularization with IR on 2/5. On 2/8 found to have small LV apical thrombus on ECHO. Pharmacy consulted to dose IV heparin, which was switched to bivalirudin 2/15 for HIT. 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 SRA very positive at 56. Heparin allergy has appropriately been added to chart and updated with SRA results.   Transitioned off bivalrudin on 4/14 and restarted on 4/15. Of note, patient being a very difficult stick, due to extensive scarring after being stuck so many times with this prologed admission and previously refusing additional sticks. Checking daily labs with therapeutic levels. Per protocol, will stop bivalirudin when INR > 3.0 and check INR and aPTT 4-6 hr later.   APTT is therapeutic at 76 secs on 0.07 mg/kg/hr.  INR therapeutic 2.0 but  likely not truly 2.0 with bivalirudin that can transiently increase INR.   Patient removed his PEG tube on 4/15, unable to place new PEG there is a backorder. Inserted a foley tube instead which was able to be utilized. Updated by RN this morning that it is slightly out of position and will need to be evaluated by MD today.   H/H improved, platelets WNL. Per RN, no issues with IV or bleeding observed.  Goal of Therapy:  aPTT 50-85 seconds INR 2-3 Monitor platelets by anticoagulation protocol: Yes   Plan:   Continue bivalirudin infusion at 0.07 mg/kg/hr Warfarin 7.5 mg X 1  Monitor daily aPTT, CBC, INR, BMET Monitor for s/sx of bleeding  Nicoletta Dress, PharmD PGY2 Infectious Disease Pharmacy Resident

## 2019-07-02 NOTE — Progress Notes (Signed)
Patient pulled out NG tube. Attempted new insertion with multiple nurses and patient was not compliant. NG placement unsuccessful, bloody secretions noted on NG tube. Elink made aware. Continuing to monitor.

## 2019-07-02 NOTE — Progress Notes (Signed)
While pt was sitting in chair pt was able to pull his NG tube out.  Pt in no signs of distress.  WIll attempt to place again. Irven Baltimore, RN

## 2019-07-02 NOTE — Progress Notes (Signed)
Late entry.  This Probation officer informed Dr. Loanne Drilling this morning about the foreign body that appeared on the abdominal radiograph that was completed at 1031.  Irven Baltimore, RN

## 2019-07-02 NOTE — Procedures (Signed)
I was present at this dialysis session. I have reviewed the session itself and made appropriate changes.   Use 3K.  TDC.  Goal UF 2L but keep SBP up throughout.  IVB albumin 25gm if needed. Hb stable.  Filed Weights   06/30/19 0154 07/01/19 0420 07/02/19 0500  Weight: 98 kg 98.3 kg 95.2 kg    Recent Labs  Lab 06/30/19 0252 07/01/19 0630 07/02/19 0342  NA 136   < > 137  K 3.7   < > 3.8  CL 95*   < > 96*  CO2 24   < > 20*  GLUCOSE 126*   < > 138*  BUN 43*   < > 104*  CREATININE 1.53*   < > 4.74*  CALCIUM 9.4   < > 9.9  PHOS 3.1  --   --    < > = values in this interval not displayed.    Recent Labs  Lab 06/30/19 0252 07/01/19 0630 07/02/19 0342  WBC 12.9* 12.5* 10.5  HGB 10.3* 11.2* 11.0*  HCT 34.8* 38.3* 37.6*  MCV 102.7* 100.3* 100.8*  PLT 346 351 381    Scheduled Meds: . ARIPiprazole  10 mg Per Tube Daily  . aspirin  81 mg Per Tube Daily  . atorvastatin  40 mg Per Tube q1800  . chlorhexidine gluconate (MEDLINE KIT)  15 mL Mouth Rinse BID  . Chlorhexidine Gluconate Cloth  6 each Topical Q0600  . [START ON 07/04/2019] Darbepoetin Alfa  150 mcg Intravenous Q Mon-1800  . feeding supplement (PRO-STAT SUGAR FREE 64)  60 mL Per Tube TID  . FLUoxetine  20 mg Per Tube Daily  . free water  100 mL Per Tube Q4H  . insulin aspart  0-20 Units Subcutaneous Q4H  . insulin detemir  15 Units Subcutaneous BID  . isosorbide-hydrALAZINE  1 tablet Per Tube BID  . mouth rinse  15 mL Mouth Rinse 10 times per day  . midodrine  10 mg Per Tube Q8H  . pantoprazole sodium  40 mg Per Tube Q1200  . QUEtiapine  50 mg Per Tube QHS  . sodium chloride flush  10-40 mL Intracatheter Q12H  . Warfarin - Pharmacist Dosing Inpatient   Does not apply q1600   Continuous Infusions: . anticoagulant sodium citrate    . anticoagulant sodium citrate    . bivalirudin (ANGIOMAX) infusion 0.5 mg/mL (Non-ACS indications) 0.07 mg/kg/hr (07/02/19 0300)  . dextrose 20 mL/hr at 07/02/19 0636  . feeding  supplement (VITAL 1.5 CAL) Stopped (07/02/19 0300)   PRN Meds:.acetaminophen, ALPRAZolam, anticoagulant sodium citrate, anticoagulant sodium citrate, bisacodyl, fentaNYL (SUBLIMAZE) injection, lidocaine (PF), lidocaine-prilocaine, lip balm, loperamide HCl, ondansetron (ZOFRAN) IV, pentafluoroprop-tetrafluoroeth, polyethylene glycol, sodium chloride flush, white petrolatum   [name]  MD 07/02/2019, 7:19 AM

## 2019-07-02 NOTE — Progress Notes (Signed)
eLink Physician-Brief Progress Note Patient Name: Carlos Brewer. DOB: 1986/02/28 MRN: [id number]   Date of Service  07/02/2019  HPI/Events of Note  Request to reorder expired tube feed orders.   eICU Interventions  Will reorder tube feeds.      Intervention Category Major Interventions: Other:  [name] 07/02/2019, 8:23 PM

## 2019-07-03 ENCOUNTER — Inpatient Hospital Stay (HOSPITAL_COMMUNITY): Payer: No Typology Code available for payment source

## 2019-07-03 DIAGNOSIS — N179 Acute kidney failure, unspecified: Secondary | ICD-10-CM | POA: Diagnosis not present

## 2019-07-03 DIAGNOSIS — I639 Cerebral infarction, unspecified: Secondary | ICD-10-CM | POA: Diagnosis not present

## 2019-07-03 DIAGNOSIS — Z515 Encounter for palliative care: Secondary | ICD-10-CM | POA: Diagnosis not present

## 2019-07-03 DIAGNOSIS — J9601 Acute respiratory failure with hypoxia: Secondary | ICD-10-CM | POA: Diagnosis not present

## 2019-07-03 DIAGNOSIS — I63312 Cerebral infarction due to thrombosis of left middle cerebral artery: Secondary | ICD-10-CM | POA: Diagnosis not present

## 2019-07-03 LAB — BASIC METABOLIC PANEL
Anion gap: 18 — ABNORMAL HIGH (ref 5–15)
BUN: 38 mg/dL — ABNORMAL HIGH (ref 6–20)
CO2: 23 mmol/L (ref 22–32)
Calcium: 10 mg/dL (ref 8.9–10.3)
Chloride: 94 mmol/L — ABNORMAL LOW (ref 98–111)
Creatinine, Ser: 3.79 mg/dL — ABNORMAL HIGH (ref 0.61–1.24)
GFR calc Af Amer: 22 mL/min — ABNORMAL LOW (ref >60)
GFR calc non Af Amer: 19 mL/min — ABNORMAL LOW (ref >60)
Glucose, Bld: 119 mg/dL — ABNORMAL HIGH (ref 70–99)
Potassium: 4.1 mmol/L (ref 3.5–5.1)
Sodium: 135 mmol/L (ref 135–145)

## 2019-07-03 LAB — CBC
HCT: 39 % (ref 39.0–52.0)
Hemoglobin: 11.4 g/dL — ABNORMAL LOW (ref 13.0–17.0)
MCH: 29.6 pg (ref 26.0–34.0)
MCHC: 29.2 g/dL — ABNORMAL LOW (ref 30.0–36.0)
MCV: 101.3 fL — ABNORMAL HIGH (ref 80.0–100.0)
Platelets: 379 10*3/uL (ref 150–400)
RBC: 3.85 MIL/uL — ABNORMAL LOW (ref 4.22–5.81)
RDW: 18.6 % — ABNORMAL HIGH (ref 11.5–15.5)
WBC: 10.3 10*3/uL (ref 4.0–10.5)
nRBC: 0.2 % (ref 0.0–0.2)

## 2019-07-03 LAB — GLUCOSE, CAPILLARY
Glucose-Capillary: 101 mg/dL — ABNORMAL HIGH (ref 70–99)
Glucose-Capillary: 109 mg/dL — ABNORMAL HIGH (ref 70–99)
Glucose-Capillary: 137 mg/dL — ABNORMAL HIGH (ref 70–99)
Glucose-Capillary: 162 mg/dL — ABNORMAL HIGH (ref 70–99)
Glucose-Capillary: 175 mg/dL — ABNORMAL HIGH (ref 70–99)
Glucose-Capillary: 182 mg/dL — ABNORMAL HIGH (ref 70–99)

## 2019-07-03 LAB — APTT: aPTT: 69 seconds — ABNORMAL HIGH (ref 24–36)

## 2019-07-03 LAB — PROTIME-INR
INR: 2.3 — ABNORMAL HIGH (ref 0.8–1.2)
Prothrombin Time: 25.5 seconds — ABNORMAL HIGH (ref 11.4–15.2)

## 2019-07-03 MED ORDER — WARFARIN SODIUM 7.5 MG PO TABS
7.5000 mg | ORAL_TABLET | Freq: Once | ORAL | Status: AC
  Administered 2019-07-03: 7.5 mg
  Filled 2019-07-03: qty 1

## 2019-07-03 NOTE — Progress Notes (Signed)
Admit: 04/28/2019 LOS: 61  70M originally presented with L MCA CVA s/p tPA and thrombectomy; lV Thrombus with sCHF/cardiogenic shock; VDRF req trach; additional CVA  Subjective:   . Tol iHD yesterday, 1L UF  04/17 0701 - 04/18 0700 In: 862.3 [I.V.:614.8; NG/GT:247.5] Out: 1077   Filed Weights   07/02/19 0700 07/02/19 1130 07/03/19 0300  Weight: 97.1 kg 95.6 kg 94.5 kg    Scheduled Meds: . ARIPiprazole  10 mg Per Tube Daily  . aspirin  81 mg Per Tube Daily  . atorvastatin  40 mg Per Tube q1800  . chlorhexidine gluconate (MEDLINE KIT)  15 mL Mouth Rinse BID  . Chlorhexidine Gluconate Cloth  6 each Topical Q0600  . [START ON 07/04/2019] Darbepoetin Alfa  150 mcg Intravenous Q Mon-1800  . feeding supplement (PRO-STAT SUGAR FREE 64)  60 mL Per Tube TID  . FLUoxetine  20 mg Per Tube Daily  . free water  100 mL Per Tube Q4H  . insulin aspart  0-20 Units Subcutaneous Q4H  . insulin detemir  15 Units Subcutaneous BID  . isosorbide-hydrALAZINE  1 tablet Per Tube BID  . mouth rinse  15 mL Mouth Rinse 10 times per day  . midodrine  10 mg Per Tube Q8H  . pantoprazole sodium  40 mg Per Tube Q1200  . QUEtiapine  50 mg Per Tube QHS  . sodium chloride flush  10-40 mL Intracatheter Q12H  . Warfarin - Pharmacist Dosing Inpatient   Does not apply q1600   Continuous Infusions: . anticoagulant sodium citrate    . anticoagulant sodium citrate    . bivalirudin (ANGIOMAX) infusion 0.5 mg/mL (Non-ACS indications) 0.07 mg/kg/hr (07/03/19 0600)  . dextrose 20 mL/hr at 07/03/19 0600  . dextrose Stopped (07/02/19 1730)  . feeding supplement (VITAL 1.5 CAL) Stopped (07/02/19 2200)   PRN Meds:.acetaminophen, ALPRAZolam, anticoagulant sodium citrate, anticoagulant sodium citrate, bisacodyl, fentaNYL (SUBLIMAZE) injection, lidocaine (PF), lidocaine-prilocaine, lip balm, loperamide HCl, ondansetron (ZOFRAN) IV, pentafluoroprop-tetrafluoroeth, polyethylene glycol, sodium chloride flush, white  petrolatum  Current Labs: reviewed    Physical Exam:  Blood pressure 93/66, pulse 99, temperature 98.3 F (36.8 C), temperature source Oral, resp. rate (!) 22, height 6' 1"  (1.854 m), weight 94.5 kg, SpO2 99 %. Awake, tracks L IJ TDC present RRR  CTAB No sig LEE S/nt  A 1. Dialysis dependent AKI, anuric 2. Prolonged VDRF, trach present 3. S/p CVA at admission 4. Chronic sCHF 5. LV Thrombus 6. HIT on bivalirudin 7. Polymorphic VT 8. Anemia ESA qTues, Hb stable 9. CKD-BMD; stable #s  P . Cont HD on THS schedule:  2K 4h TDC 400/600, no heparin, UF 2L . Medication Issues; o Preferred narcotic agents for pain control are hydromorphone, fentanyl, and methadone. Morphine should not be used.  o Baclofen should be avoided o Avoid oral sodium phosphate and magnesium citrate based laxatives / bowel preps    [name] MD 07/03/2019, 7:57 AM  Recent Labs  Lab 06/29/19 0334 06/29/19 0334 06/29/19 1537 06/29/19 1537 06/30/19 0252 06/30/19 0252 07/01/19 0630 07/02/19 0342 07/03/19 0313  NA 136   < > 136   < > 136   < > 139 137 135  K 4.5   < > 4.9   < > 3.7   < > 3.8 3.8 4.1  CL 98   < > 100   < > 95*   < > 97* 96* 94*  CO2 20*   < > 20*   < > 24   < >  20* 20* 23  GLUCOSE 105*   < > 104*   < > 126*   < > 78 138* 119*  BUN 41*   < > 42*   < > 43*   < > 72* 104* 38*  CREATININE 1.43*   < > 1.43*   < > 1.53*   < > 3.37* 4.74* 3.79*  CALCIUM 9.4   < > 9.2   < > 9.4   < > 10.4* 9.9 10.0  PHOS 2.5  --  2.6  --  3.1  --   --   --   --    < > = values in this interval not displayed.   Recent Labs  Lab 07/01/19 0630 07/02/19 0342 07/03/19 0313  WBC 12.5* 10.5 10.3  HGB 11.2* 11.0* 11.4*  HCT 38.3* 37.6* 39.0  MCV 100.3* 100.8* 101.3*  PLT 351 381 379

## 2019-07-03 NOTE — Progress Notes (Signed)
ANTICOAGULATION CONSULT NOTE  Pharmacy Consult:  Bivalirudin and Warfarin [place] Indication: stroke 2/5, LV thrombus 2/8, HIT 2/15  Allergies  Allergen Reactions  . Heparin Other (See Comments)    Heparin induced thrombocytopenia. 2/15 HIT OD 1.692. 2/16 SRA positive-90.     Patient Measurements: Height: 6\' 1"  (185.4 cm) Weight: 94.5 kg (208 lb 5.4 oz) IBW/kg (Calculated) : 79.9   Vital Signs: Temp: 98.3 F (36.8 C) (04/18 0725) Temp Source: Oral (04/18 0725) BP: 93/66 (04/18 0730) Pulse Rate: 99 (04/18 0748)  Labs: Recent Labs    07/01/19 0630 07/01/19 0630 07/02/19 0342 07/03/19 0313  HGB 11.2*   < > 11.0* 11.4*  HCT 38.3*  --  37.6* 39.0  PLT 351  --  381 379  APTT 77*  --  76* 69*  LABPROT 19.9*  --  22.7* 25.5*  INR 1.7*  --  2.0* 2.3*  CREATININE 3.37*  --  4.74* 3.79*   < > = values in this interval not displayed.    Estimated Creatinine Clearance: 30.7 mL/min (A) (by C-G formula based on SCr of 3.79 mg/dL (H)).  Assessment: 34 yr old male with history of ESRD on HD presented with left MCA infarct - received TPA and revascularization with IR on 2/5. On 2/8 found to have small LV apical thrombus on ECHO. Pharmacy consulted to dose IV heparin, which was switched to bivalirudin 2/15 for HIT. 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 SRA very positive at 79. Heparin allergy has appropriately been added to chart and updated with SRA results.   Transitioned off bivalrudin on 4/14 and restarted on 4/15. Of note, patient being a very difficult stick, due to extensive scarring after being stuck so many times with this prologed admission and previously refusing additional sticks. Checking daily labs with therapeutic levels. Per protocol, will stop bivalirudin when INR > 3.0 and check INR and aPTT 4-6 hr later.   APTT is therapeutic at 69 secs on 0.07 mg/kg/hr.  INR coming up and therapeutic at 2.3 but likely not truly 2.3 with bivalirudin  that can transiently increase INR.   Patient removed his PEG tube on 4/15, unable to place new PEG there is a backorder. Patient continues to pull out NG tube, reinserted again this morning.   H/H improved, platelets WNL. Per RN, no issues with IV or bleeding observed.  Goal of Therapy:  aPTT 50-85 seconds INR 2-3 Monitor platelets by anticoagulation protocol: Yes   Plan:   Continue bivalirudin infusion at 0.07 mg/kg/hr Warfarin 7.5 mg X 1  Monitor daily aPTT, CBC, INR, BMET Monitor for s/sx of bleeding  Nicoletta Dress, PharmD PGY2 Infectious Disease Pharmacy Resident

## 2019-07-03 NOTE — Progress Notes (Signed)
PROGRESS NOTE  Carlos Brewer. JFH:[id number] DOB: 08-02-1985 DOA: 05/06/2019 PCP: Patient, No Pcp Per  HPI/Recap of past 24 hours: HPI from PCCM 34 yo male smoker found to have slurred speech and Rt sided weakness. Admitted 2/5 with left MCA CVA and multiple smaller embolic infarcts>>>  tPA and thrombectomy in IR. Additionally found to have left ventricular thrombus. Course complicated by septic shock r/t HCAP, AKI requiring CRRT, polymorphic VT and cardiogenic shock.  Intermittent pressor dependence, required tracheostomy, intermittent hemodialysis, pontine CVA, Heart failure with reduced ejection fraction < 15% on last echocardiogram, Recurrent episodes of bilateral infiltrates and mechanical ventilation, ??Aspiration pneumonitis.  Patient has been somewhat stabilized in the ICU.  [facility] hospitalist to resume care on 07/03/2019.     Overnight, patient noted to pull out his NG tube multiple times including this a.m., also noted to pull out his PEG tube.  Patient is able to nod, to answer simple questions.  Still appears delirious, as evidenced by pulling out tubes.  Looks comfortable.   Assessment/Plan: Active Problems:   Occlusion of left middle cerebral artery   Middle cerebral artery embolism, left   Cerebrovascular accident (CVA) due to thrombosis of left middle cerebral artery (HCC)   Shock ([place])   Shock liver   Respiratory failure (HCC)   Pressure injury of skin   Tracheostomy tube present (HCC)   Acute CVA (cerebrovascular accident) ([place])   Acute kidney injury ([place])   Endotracheal tube present   Complication of tracheostomy tube ([place])   Acute hypoxemic respiratory failure (HCC)   Fever   Type 2 diabetes mellitus with other specified complication (HCC)   Hypoxia   HFrEF (heart failure with reduced ejection fraction) ([place])   Pneumothorax   Pneumomediastinum ([place])   Hemoptysis   Palliative care encounter   Acute renal failure ([place])   Acute on chronic respiratory  failure Tracheostomy with intermittent bleeding Stable Tolerating trach collar Pulmonary hygiene PCCM for trach/vent needs  End-stage cardiomyopathy/chronic systolic HF/polymorphic VT/LV thrombus Followed by cardiology, signed off on 4/3 as patient is not a candidate for advanced heart failure therapies Continue hydralazine, midodrine, Coumadin Telemetry  End-stage renal failure Multifactorial including ATN following cardiogenic/septic shock Anuric without evidence of renal recovery Status post CRRT, stopped on 4/15 Currently tolerating HD Management by nephrology  Diabetes mellitus type 2 SSI, Lantus, Accu-Cheks, hypoglycemic protocol Continue tube feeds once NGT has been reinserted D5 drip while patient is n.p.o.  HIT INR 2.3 Continue Angiomax  CVA on admission Continue ASA, Lipitor  Radiopaque density in abdomen ??  Ingested foreign body Serial KUBs  ICU delirium/intermittent agitation Noted to keep pulling out NG tube, PEG tube Has a Foley catheter in the site of PEG, to prevent closure, as there is Tree surgeon of PEG tubes Continue Seroquel Surveyor, quantity, restraints  Goals of care Very poor prognosis Palliative care on board-full scope of treatment       Malnutrition Type:  Nutrition Problem: Inadequate oral intake Etiology: inability to eat   Malnutrition Characteristics:  Signs/Symptoms: NPO status   Nutrition Interventions:  Interventions: Tube feeding, Prostat    Estimated body mass index is 27.49 kg/m as calculated from the following:   Height as of this encounter: 6' 1"  (1.854 m).   Weight as of this encounter: 94.5 kg.     Code Status: Full  Family Communication: Plan to discuss with mother  Disposition Plan: Patient has a long stay in the hospital, not yet medically stable for discharge, pending consultants  signoff.  Plan for LTAC  placement   Consultants:  PCCM  Cardiology/EP  Nephrology  Neurology  IR  Palliative   Procedures: 2/5-2/6:  TPA given at 428 am (total of 90 Mg) 520 am went to IR  S/P Lt common carotid arteriogram followed by complete revascularization of occluded LT MCA sup division mid M2 seg with x 1 pass with 73mx 40 mm solitaire X ret river device and penumbra aspiration with TICI3revascularization  ETT 2/05 >> 2/18 LIJ CVL 2/5 >>2/18  RT [place] CVL 2/18 >>3/2 RIJ HD cath 2/6 >>2/18 6 shiley cuffed trach 2/18  >>3/4  RT fem HD 2/19 >>3/2 RIJ permacath 3/2>> 3/4 shiley 4 uncuffed.... dislodged placed back 6cuffed shiley that evening 3/5: change to #6 cuffed distal XLT  3/17 right femoral CVL >> 3/23 3/17 right femoral arterial line >>3/23 3/31 bronchoscopy : airway clear, conclusion = end of trach up against the wall obstructing airflow   3/31 trach change to cuffed #6 XLT  4/7 PEG by IR  Antimicrobials:  None  DVT prophylaxis: Coumadin   Objective: Vitals:   07/03/19 1400 07/03/19 1500 07/03/19 1512 07/03/19 1539  BP: 118/74 104/63    Pulse: (!) 106 (!) 103  (!) 103  Resp: 12 16  20   Temp:   (!) 97.5 F (36.4 C)   TempSrc:   Oral   SpO2: 99% 97%  98%  Weight:      Height:        Intake/Output Summary (Last 24 hours) at 07/03/2019 1612 Last data filed at 07/03/2019 1500 Gross per 24 hour  Intake 1082.28 ml  Output --  Net 1082.28 ml   Filed Weights   07/02/19 0700 07/02/19 1130 07/03/19 0300  Weight: 97.1 kg 95.6 kg 94.5 kg    Exam:  General: NAD, chronically ill-appearing, trach collar in place  Cardiovascular: S1, S2 present  Respiratory: CTAB  Abdomen: Soft, nontender, nondistended, bowel sounds present, in place of PEG site  Musculoskeletal: No bilateral pedal edema noted  Skin: Normal  Psychiatry: Normal mood  Neurology: Awake, tracks, able to follow simple commands, moves all extremities   Data Reviewed: CBC: Recent Labs   Lab 06/29/19 0334 06/30/19 0252 07/01/19 0630 07/02/19 0342 07/03/19 0313  WBC 13.4* 12.9* 12.5* 10.5 10.3  HGB 9.4* 10.3* 11.2* 11.0* 11.4*  HCT 32.4* 34.8* 38.3* 37.6* 39.0  MCV 104.5* 102.7* 100.3* 100.8* 101.3*  PLT 320 346 351 381 3814  Basic Metabolic Panel: Recent Labs  Lab 06/27/19 0538 06/27/19 1545 06/28/19 0518 06/28/19 0518 06/28/19 1437 06/28/19 1437 06/29/19 0334 06/29/19 0334 06/29/19 1537 06/30/19 0252 07/01/19 0630 07/02/19 0342 07/03/19 0313  NA 137   < > 137   < > 137   < > 136   < > 136 136 139 137 135  K 5.0   < > 4.7   < > 4.6   < > 4.5   < > 4.9 3.7 3.8 3.8 4.1  CL 98   < > 99   < > 99   < > 98   < > 100 95* 97* 96* 94*  CO2 22   < > 22   < > 21*   < > 20*   < > 20* 24 20* 20* 23  GLUCOSE 146*   < > 96   < > 99   < > 105*   < > 104* 126* 78 138* 119*  BUN 47*   < > 41*   < >  42*   < > 41*   < > 42* 43* 72* 104* 38*  CREATININE 2.17*   < > 1.51*   < > 1.44*   < > 1.43*   < > 1.43* 1.53* 3.37* 4.74* 3.79*  CALCIUM 9.4   < > 9.2   < > 9.0   < > 9.4   < > 9.2 9.4 10.4* 9.9 10.0  MG 2.3  --  2.4  --   --   --  2.6*  --   --   --  3.1* 3.3*  --   PHOS 3.4   < > 2.7  --  2.8  --  2.5  --  2.6 3.1  --   --   --    < > = values in this interval not displayed.   GFR: Estimated Creatinine Clearance: 30.7 mL/min (A) (by C-G formula based on SCr of 3.79 mg/dL (H)). Liver Function Tests: Recent Labs  Lab 06/28/19 0518 06/28/19 1437 06/29/19 0334 06/29/19 1537 06/30/19 0252  ALBUMIN 2.3* 2.5* 2.6* 2.4* 2.7*   No results for input(s): LIPASE, AMYLASE in the last 168 hours. No results for input(s): AMMONIA in the last 168 hours. Coagulation Profile: Recent Labs  Lab 06/29/19 0334 06/30/19 0252 07/01/19 0630 07/02/19 0342 07/03/19 0313  INR 1.8* 1.2 1.7* 2.0* 2.3*   Cardiac Enzymes: No results for input(s): CKTOTAL, CKMB, CKMBINDEX, TROPONINI in the last 168 hours. BNP (last 3 results) No results for input(s): PROBNP in the last 8760  hours. HbA1C: No results for input(s): HGBA1C in the last 72 hours. CBG: Recent Labs  Lab 07/02/19 1946 07/03/19 0002 07/03/19 0725 07/03/19 1133 07/03/19 1511  GLUCAP 162* 101* 109* 137* 175*   Lipid Profile: No results for input(s): CHOL, HDL, LDLCALC, TRIG, CHOLHDL, LDLDIRECT in the last 72 hours. Thyroid Function Tests: No results for input(s): TSH, T4TOTAL, FREET4, T3FREE, THYROIDAB in the last 72 hours. Anemia Panel: No results for input(s): VITAMINB12, FOLATE, FERRITIN, TIBC, IRON, RETICCTPCT in the last 72 hours. Urine analysis:    Component Value Date/Time   COLORURINE AMBER (A) 04/23/2019 0930   APPEARANCEUR CLOUDY (A) 04/23/2019 0930   LABSPEC 1.025 04/23/2019 0930   PHURINE 6.0 04/23/2019 0930   GLUCOSEU 50 (A) 04/23/2019 0930   HGBUR LARGE (A) 04/23/2019 0930   BILIRUBINUR NEGATIVE 04/23/2019 0930   KETONESUR NEGATIVE 04/23/2019 0930   PROTEINUR >=300 (A) 04/23/2019 0930   NITRITE NEGATIVE 04/23/2019 0930   LEUKOCYTESUR NEGATIVE 04/23/2019 0930   Sepsis Labs: @LABRCNTIP (procalcitonin:4,lacticidven:4)  ) Recent Results (from the past 240 hour(s))  Culture, respiratory (non-expectorated)     Status: None   Collection Time: 06/26/19 11:38 AM   Specimen: Tracheal Aspirate; Respiratory  Result Value Ref Range Status   Specimen Description TRACHEAL ASPIRATE  Final   Special Requests NONE  Final   Gram Stain   Final    RARE WBC PRESENT,BOTH PMN AND MONONUCLEAR NO ORGANISMS SEEN    Culture   Final    Consistent with normal respiratory flora. Performed at [facility], [place] [address] [place], [place] [address]    Report Status 06/28/2019 FINAL  Final  Culture, blood (Routine X 2) w Reflex to ID Panel     Status: None   Collection Time: 06/27/19  3:45 PM   Specimen: BLOOD LEFT HAND  Result Value Ref Range Status   Specimen Description BLOOD LEFT HAND  Final   Special Requests   Final    BOTTLES DRAWN AEROBIC  ONLY Blood Culture results may not be  optimal due to an inadequate volume of blood received in culture bottles   Culture   Final    NO GROWTH 5 DAYS Performed at [facility] [address] [place], [place] [address]    Report Status 07/02/2019 FINAL  Final      Studies: DG Abd 1 View  Result Date: 07/03/2019 CLINICAL DATA:  Nasogastric tube placement EXAM: ABDOMEN - 1 VIEW COMPARISON:  1 day prior FINDINGS: Nasogastric tube terminates at the distal stomach. Bibasilar airspace disease is suboptimally evaluated. The previously described linear density projects over the left mid abdomen. IMPRESSION: Nasogastric tube terminating at the distal stomach. Radiopaque density currently projecting in the left mid abdomen. This may be within the descending colon, given position on previous radiographs. Electronically Signed   By: Abigail Miyamoto M.D.   On: 07/03/2019 10:47   DG Abd 1 View  Result Date: 07/02/2019 CLINICAL DATA:  Enteric tube placement EXAM: ABDOMEN - 1 VIEW COMPARISON:  Abdominal radiograph from earlier today FINDINGS: Enteric tube terminates in the distal stomach. Linear 2 cm metallic structure overlies the medial left abdomen at the L2 level, which overlied the right abdomen on the previous abdominal radiograph from this morning. No dilated small bowel loops. Rectal catheter in place. No significant colonic stool. No evidence of pneumatosis or pneumoperitoneum. No radiopaque nephrolithiasis. IMPRESSION: 1. Enteric tube terminates in the distal stomach. 2. Nonobstructive bowel gas pattern. 3. Linear 2 cm metallic structure overlies the medial left abdomen at the L2 level, which overlied the right abdomen on comparison abdominal radiograph from this morning. Uncertain if this is an external structure versus radiopaque abdominal foreign body. Suggest patient examination for any overlying structures. CT of the abdomen and pelvis may be obtained for further evaluation as clinically warranted. These results will be called to the  ordering clinician or representative by the Radiologist Assistant, and communication documented in the PACS or [employer]. Electronically Signed   By: Ilona Sorrel M.D.   On: 07/02/2019 16:35    Scheduled Meds: . ARIPiprazole  10 mg Per Tube Daily  . aspirin  81 mg Per Tube Daily  . atorvastatin  40 mg Per Tube q1800  . chlorhexidine gluconate (MEDLINE KIT)  15 mL Mouth Rinse BID  . Chlorhexidine Gluconate Cloth  6 each Topical Q0600  . [START ON 07/04/2019] Darbepoetin Alfa  150 mcg Intravenous Q Mon-1800  . feeding supplement (PRO-STAT SUGAR FREE 64)  60 mL Per Tube TID  . FLUoxetine  20 mg Per Tube Daily  . free water  100 mL Per Tube Q4H  . insulin aspart  0-20 Units Subcutaneous Q4H  . insulin detemir  15 Units Subcutaneous BID  . isosorbide-hydrALAZINE  1 tablet Per Tube BID  . mouth rinse  15 mL Mouth Rinse 10 times per day  . midodrine  10 mg Per Tube Q8H  . pantoprazole sodium  40 mg Per Tube Q1200  . QUEtiapine  50 mg Per Tube QHS  . sodium chloride flush  10-40 mL Intracatheter Q12H  . warfarin  7.5 mg Per Tube ONCE-1600  . Warfarin - Pharmacist Dosing Inpatient   Does not apply q1600    Continuous Infusions: . anticoagulant sodium citrate    . anticoagulant sodium citrate    . bivalirudin (ANGIOMAX) infusion 0.5 mg/mL (Non-ACS indications) 0.07 mg/kg/hr (07/03/19 1500)  . dextrose Stopped (07/03/19 1141)  . dextrose Stopped (07/02/19 1730)  . feeding supplement (VITAL 1.5  CAL) 55 mL/hr at 07/03/19 1144     LOS: 72 days     Alma Friendly, MD [facility]  If 7PM-7AM, please contact night-coverage [email or web address] 07/03/2019, 4:12 PM

## 2019-07-03 NOTE — Progress Notes (Signed)
Palliative follow up for goals of care / psychosocial support.  Visited Carlos Brewer at bedside.  He nods at me in greeting and offers a smile.  He tolerated HD today.    He has had PEG placement since I last saw him and pulled it out.  PEG has been replaced and Carlos Brewer is wearing a binder.    He is able to stand with max assist.    Carlos Brewer is hopeful he will be successful with rehab and then live at home with her.   PMT will continue to check in intermittently. Full code/Full scope.  Florentina Jenny, PA-C Palliative Medicine Office:  [phone]  15 min. Greater than 50%  of this time was spent counseling and coordinating care related to the above assessment and plan.

## 2019-07-04 ENCOUNTER — Inpatient Hospital Stay (HOSPITAL_COMMUNITY): Payer: No Typology Code available for payment source

## 2019-07-04 DIAGNOSIS — J9601 Acute respiratory failure with hypoxia: Secondary | ICD-10-CM | POA: Diagnosis not present

## 2019-07-04 DIAGNOSIS — Z515 Encounter for palliative care: Secondary | ICD-10-CM | POA: Diagnosis not present

## 2019-07-04 DIAGNOSIS — N179 Acute kidney failure, unspecified: Secondary | ICD-10-CM | POA: Diagnosis not present

## 2019-07-04 DIAGNOSIS — I639 Cerebral infarction, unspecified: Secondary | ICD-10-CM | POA: Diagnosis not present

## 2019-07-04 DIAGNOSIS — I63312 Cerebral infarction due to thrombosis of left middle cerebral artery: Secondary | ICD-10-CM | POA: Diagnosis not present

## 2019-07-04 HISTORY — PX: IR REPLC GASTRO/COLONIC TUBE PERCUT W/FLUORO: IMG2333

## 2019-07-04 LAB — GLUCOSE, CAPILLARY
Glucose-Capillary: 121 mg/dL — ABNORMAL HIGH (ref 70–99)
Glucose-Capillary: 216 mg/dL — ABNORMAL HIGH (ref 70–99)
Glucose-Capillary: 193 mg/dL — ABNORMAL HIGH (ref 70–99)
Glucose-Capillary: 130 mg/dL — ABNORMAL HIGH (ref 70–99)
Glucose-Capillary: 174 mg/dL — ABNORMAL HIGH (ref 70–99)
Glucose-Capillary: 209 mg/dL — ABNORMAL HIGH (ref 70–99)
Glucose-Capillary: 157 mg/dL — ABNORMAL HIGH (ref 70–99)

## 2019-07-04 LAB — CBC
HCT: 33 % — ABNORMAL LOW (ref 39.0–52.0)
Hemoglobin: 9.9 g/dL — ABNORMAL LOW (ref 13.0–17.0)
MCH: 29.6 pg (ref 26.0–34.0)
MCHC: 30 g/dL (ref 30.0–36.0)
MCV: 98.5 fL (ref 80.0–100.0)
Platelets: 389 10*3/uL (ref 150–400)
RBC: 3.35 MIL/uL — ABNORMAL LOW (ref 4.22–5.81)
RDW: 18.1 % — ABNORMAL HIGH (ref 11.5–15.5)
WBC: 8.4 10*3/uL (ref 4.0–10.5)
nRBC: 0 % (ref 0.0–0.2)

## 2019-07-04 LAB — BASIC METABOLIC PANEL
Anion gap: 20 — ABNORMAL HIGH (ref 5–15)
BUN: 78 mg/dL — ABNORMAL HIGH (ref 6–20)
CO2: 21 mmol/L — ABNORMAL LOW (ref 22–32)
Calcium: 9.5 mg/dL (ref 8.9–10.3)
Chloride: 95 mmol/L — ABNORMAL LOW (ref 98–111)
Creatinine, Ser: 6.2 mg/dL — ABNORMAL HIGH (ref 0.61–1.24)
GFR calc Af Amer: 12 mL/min — ABNORMAL LOW (ref >60)
GFR calc non Af Amer: 11 mL/min — ABNORMAL LOW (ref >60)
Glucose, Bld: 195 mg/dL — ABNORMAL HIGH (ref 70–99)
Potassium: 3.9 mmol/L (ref 3.5–5.1)
Sodium: 136 mmol/L (ref 135–145)

## 2019-07-04 LAB — APTT
aPTT: 76 seconds — ABNORMAL HIGH (ref 24–36)
aPTT: 45 seconds — ABNORMAL HIGH (ref 24–36)

## 2019-07-04 LAB — PROTIME-INR
INR: 3.5 — ABNORMAL HIGH (ref 0.8–1.2)
INR: 2.5 — ABNORMAL HIGH (ref 0.8–1.2)
Prothrombin Time: 34.9 seconds — ABNORMAL HIGH (ref 11.4–15.2)
Prothrombin Time: 26.9 seconds — ABNORMAL HIGH (ref 11.4–15.2)

## 2019-07-04 MED ORDER — IOHEXOL 300 MG/ML  SOLN
50.0000 mL | Freq: Once | INTRAMUSCULAR | Status: AC | PRN
  Administered 2019-07-04: 10:00:00 15 mL

## 2019-07-04 MED ORDER — WARFARIN SODIUM 7.5 MG PO TABS: 7.5000 mg | ORAL_TABLET | Freq: Once | ORAL | Status: DC

## 2019-07-04 MED ORDER — HALOPERIDOL LACTATE 5 MG/ML IJ SOLN
INTRAMUSCULAR | Status: AC
  Filled 2019-07-04: qty 1

## 2019-07-04 MED ORDER — LIDOCAINE VISCOUS HCL 2 % MT SOLN
OROMUCOSAL | Status: AC
  Filled 2019-07-04: qty 15

## 2019-07-04 NOTE — Progress Notes (Signed)
PT BID Note:  Assisted patient back to bed with nursing assist due to scooting out to edge of chair and unsafe for unsupervised OOB at this time.  Vito was quick to reply he was ready for back to bed.  Feel he is improving with response time and more motivated today to participate.  PT to follow.     07/04/19 1700  PT Visit Information  Last PT Received On 07/04/19  Assistance Needed +2  History of Present Illness Pt is a 34 y/o male smoker who initially presented on 2/5 with slurred speech and Rt sided weakness. Admitted with left MCA CVA and multiple smaller embolic infarcts s/p tPA and thrombectomy in IR.  Additionally found to have left ventricular  thrombus. Hospital course complicated by septic shock r/t HCAP, AKI requiring CRRT, polymorphic VT and cardiogenic shock. Echo EF < 20%. Pt with Intermittent pressor dependence, required tracheostomy. Tolerating trach collar 3/4, back on vent 3/5 with flash pulmonary edema.  Started IHD 3/10.  G-tube placed 06/23/19, pt removed and replaced 07/04/19.  Precautions  Precautions Fall  Precaution Comments trach collar, PEG w/abdominal binder, flexiseal  Pain Assessment  Pain Assessment Faces  Faces Pain Scale 2  Pain Location generalized  Pain Descriptors / Indicators Discomfort;Restless  Pain Intervention(s) Monitored during session;Repositioned  Cognition  Arousal/Alertness Awake/alert  Behavior During Therapy Restless  Overall Cognitive Status Impaired/Different from baseline  Area of Impairment Attention;Safety/judgement  Current Attention Level Sustained  Memory Decreased recall of precautions;Decreased short-term memory  Following Commands Follows one step commands with increased time;Follows one step commands inconsistently  Safety/Judgement Decreased awareness of safety;Decreased awareness of deficits  Problem Solving Slow processing;Decreased initiation  General Comments scooting out to edge of chair but repositioning, or attempting  to, when cued   Bed Mobility  Overal bed mobility Needs Assistance  Bed Mobility Sit to Supine  Sit to supine Total assist;+2 for physical assistance  General bed mobility comments assist for legs into bed, for scooting hips & positioning  Transfers  Overall transfer level Needs assistance  Equipment used 2 person hand held assist  Sit to Stand Max assist;+2 physical assistance  Stand pivot transfers Max assist;+2 safety/equipment  General transfer comment stood from recliner with lifting help of 2 and facilitation for trunk extension, patient moving toward bed and assisted to lower onto bed  Balance  Overall balance assessment Needs assistance  Sitting-balance support Feet supported  Sitting balance-Leahy Scale Poor  Standing balance support Bilateral upper extremity supported  Standing balance-Leahy Scale Poor   PT - Assessment/Plan  PT Plan Current plan remains appropriate  PT Visit Diagnosis Other abnormalities of gait and mobility (R26.89);Other symptoms and signs involving the nervous system (R29.898);Muscle weakness (generalized) (M62.81)  PT Frequency (ACUTE ONLY) Min 3X/week  Follow Up Recommendations LTACH  AM-PAC PT "6 Clicks" Mobility Outcome Measure (Version 2)  Help needed turning from your back to your side while in a flat bed without using bedrails? 1  Help needed moving from lying on your back to sitting on the side of a flat bed without using bedrails? 1  Help needed moving to and from a bed to a chair (including a wheelchair)? 1  Help needed standing up from a chair using your arms (e.g., wheelchair or bedside chair)? 1  Help needed to walk in hospital room? 1  Help needed climbing 3-5 steps with a railing?  1  6 Click Score 6  Consider Recommendation of Discharge To: CIR/SNF/LTACH  PT Goal Progression  Progress  towards PT goals Progressing toward goals  Acute Rehab PT Goals  PT Goal Formulation With patient  Time For Goal Achievement 07/18/19  Potential to  Achieve Goals Fair  PT Time Calculation  PT Start Time (ACUTE ONLY) 1458  PT Stop Time (ACUTE ONLY) 1506  PT Time Calculation (min) (ACUTE ONLY) 8 min  PT General Charges  $$ ACUTE PT VISIT 1 Visit  PT Treatments  $Therapeutic Activity 8-22 mins   Magda Kiel, PT Acute Rehabilitation Services [phone] 07/04/2019

## 2019-07-04 NOTE — Progress Notes (Addendum)
ANTICOAGULATION CONSULT NOTE  Pharmacy Consult:  Bivalirudin and Warfarin [place] Indication: stroke 2/5, LV thrombus 2/8, HIT 2/15  Allergies  Allergen Reactions  . Heparin Other (See Comments)    Heparin induced thrombocytopenia. 2/15 HIT OD 1.692. 2/16 SRA positive-90.     Patient Measurements: Height: 6\' 1"  (185.4 cm) Weight: 94.5 kg (208 lb 5.4 oz) IBW/kg (Calculated) : 79.9   Vital Signs: Temp: 98.5 F (36.9 C) (04/19 0745) Temp Source: Axillary (04/19 0745) BP: 110/69 (04/19 0731) Pulse Rate: 101 (04/19 0731)  Labs: Recent Labs    07/02/19 0342 07/02/19 0342 07/03/19 0313 07/04/19 0313  HGB 11.0*   < > 11.4* 9.9*  HCT 37.6*  --  39.0 33.0*  PLT 381  --  379 389  APTT 76*  --  69* 76*  LABPROT 22.7*  --  25.5* 34.9*  INR 2.0*  --  2.3* 3.5*  CREATININE 4.74*  --  3.79* 6.20*   < > = values in this interval not displayed.    Estimated Creatinine Clearance: 18.8 mL/min (A) (by C-G formula based on SCr of 6.2 mg/dL (H)).  Assessment: 34 yr old male with history of ESRD on HD presented with left MCA infarct - received TPA and revascularization with IR on 2/5. On 2/8 found to have small LV apical thrombus on ECHO. Pharmacy consulted to dose IV heparin, which was switched to bivalirudin 2/15 for HIT. 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 SRA very positive at 16. Heparin allergy has appropriately been added to chart and updated with SRA results. Of note, patient is a very difficult stick, due to extensive scarring after being stuck so many times since admission. Patient continues to remove his PEG tube, latest 4/18 and unable to place new PEG due to backorder. Patient currently has an NG tube for med.   Per protocol, will stop bivalirudin when INR > 3.0 and check INR and aPTT 4-6hr later.  1.If INR at goal (2-3) a. If aPTT >40: Assume prolonged bivalirudin clearance and repeat aPTT and INR in 4-6 hrs. b. If aPTT  <40: Begin warfarin alone 2.INR subtherapeutic: Resume bivalirudin  INR 3.5 and APTT therapeutic at 76 secs on 0.07 mg/kg/hr. Will stop bivalirudin and check an APTT and INR in 6 hours. H/H stable, platelets WNL. Per RN, no issues with IV or bleeding observed.  Goal of Therapy:  aPTT 50-85 seconds INR 2-3 Monitor platelets by anticoagulation protocol: Yes   Plan:   Stopped bivalirudin infusion at 0.07 mg/kg/hr @ 0835 Recheck INR and aPTT in 6 hrs  Plan for warfarin tonight x1 Monitor daily aPTT, CBC, INR, BMET Monitor for s/sx of bleeding  Lorel Monaco, PharmD PGY1 Ambulatory Care Resident [employer] # [phone]   ADDENDUM: Follow-up 6 hr INR was therapeutic at 2.5 and APTT of 45 secs was borderline elevated. Due to patient being a very difficult stick and previously refusing labs, will defer to monitor INR w/AM labs. Would anticipate INR to remain within goal tomorrow AM. H/H stable, platelets WNL.  Plan: Warfarin 7.5 mg x1 Recheck INR w/AM labs  Lorel Monaco, PharmD PGY1 Ambulatory Care Resident [employer] # [phone]

## 2019-07-04 NOTE — Progress Notes (Signed)
PROGRESS NOTE  Carlos Brewer. BSJ:[id number] DOB: Feb 25, 1986 DOA: 04/20/2019 PCP: Patient, No Pcp Per  HPI/Recap of past 24 hours: HPI from PCCM 34 yo male smoker found to have slurred speech and Rt sided weakness. Admitted 2/5 with left MCA CVA and multiple smaller embolic infarcts>>>  tPA and thrombectomy in IR. Additionally found to have left ventricular thrombus. Course complicated by septic shock r/t HCAP, AKI requiring CRRT, polymorphic VT and cardiogenic shock.  Intermittent pressor dependence, required tracheostomy, intermittent hemodialysis, pontine CVA, Heart failure with reduced ejection fraction < 15% on last echocardiogram, Recurrent episodes of bilateral infiltrates and mechanical ventilation, ??Aspiration pneumonitis.  Patient has been somewhat stabilized in the ICU.  [facility] hospitalist to resume care on 07/03/2019.    Today, pt was able to participate a little better with PT, was moved around in the recliner in the unit, was more responsive, by nodding his head appropriately to questions asked.  Patient had his PEG tube replaced today.      Assessment/Plan: Active Problems:   Occlusion of left middle cerebral artery   Middle cerebral artery embolism, left   Cerebrovascular accident (CVA) due to thrombosis of left middle cerebral artery (HCC)   Shock ([place])   Shock liver   Respiratory failure (HCC)   Pressure injury of skin   Tracheostomy tube present (HCC)   Acute CVA (cerebrovascular accident) ([place])   Acute kidney injury ([place])   Endotracheal tube present   Complication of tracheostomy tube ([place])   Acute hypoxemic respiratory failure (HCC)   Fever   Type 2 diabetes mellitus with other specified complication (HCC)   Hypoxia   HFrEF (heart failure with reduced ejection fraction) ([place])   Pneumothorax   Pneumomediastinum (HCC)   Hemoptysis   Palliative care encounter   Acute renal failure ([place])   Acute on chronic respiratory failure Tracheostomy with  intermittent bleeding Stable Tolerating trach collar Pulmonary hygiene PCCM for trach/vent needs  End-stage cardiomyopathy/chronic systolic HF/polymorphic VT/LV thrombus Followed by cardiology, signed off on 4/3 as patient is not a candidate for advanced heart failure therapies Continue hydralazine, midodrine, Coumadin Telemetry  End-stage renal failure Multifactorial including ATN following cardiogenic/septic shock Anuric without evidence of renal recovery Status post CRRT, stopped on 4/15 Currently tolerating HD Management by nephrology  Diabetes mellitus type 2 SSI, Lantus, Accu-Cheks, hypoglycemic protocol  HIT Continue Angiomax For the management by pharmacy  CVA on admission Continue ASA, Lipitor  Radiopaque density in abdomen ??  Ingested foreign body Serial KUBs  ICU delirium/intermittent agitation Noted to keep pulling out NG tube, PEG tube PEG tube replaced by IR on 07/04/2019 Continue Seroquel One-to-one sitter, restraints  Nutrition/dysphagia PEG tube replaced by IR on 07/04/2019 Continue tube feeds  Goals of care Very poor prognosis Palliative care on board-full scope of treatment       Malnutrition Type:  Nutrition Problem: Inadequate oral intake Etiology: inability to eat   Malnutrition Characteristics:  Signs/Symptoms: NPO status   Nutrition Interventions:  Interventions: Tube feeding, Prostat    Estimated body mass index is 27.49 kg/m as calculated from the following:   Height as of this encounter: '6\' 1"'$  (1.854 m).   Weight as of this encounter: 94.5 kg.     Code Status: Full  Family Communication: Plan to discuss with mother  Disposition Plan: Patient has a long stay in the hospital, not yet medically stable for discharge, pending consultants signoff.  Plan for [facility] placement   Consultants:  PCCM  Cardiology/EP  Nephrology  Neurology  IR  Palliative   Procedures: 2/5-2/6:  TPA given at 428 am (total of 90  Mg) 520 am went to IR  S/P Lt common carotid arteriogram followed by complete revascularization of occluded LT MCA sup division mid M2 seg with x 1 pass with 47mx 40 mm solitaire X ret river device and penumbra aspiration with TICI3revascularization  ETT 2/05 >> 2/18 LIJ CVL 2/5 >>2/18  RT Rivereno CVL 2/18 >>3/2 RIJ HD cath 2/6 >>2/18 6 shiley cuffed trach 2/18  >>3/4  RT fem HD 2/19 >>3/2 RIJ permacath 3/2>> 3/4 shiley 4 uncuffed.... dislodged placed back 6cuffed shiley that evening 3/5: change to #6 cuffed distal XLT  3/17 right femoral CVL >> 3/23 3/17 right femoral arterial line >>3/23 3/31 bronchoscopy : airway clear, conclusion = end of trach up against the wall obstructing airflow   3/31 trach change to cuffed #6 XLT  4/7 PEG by IR  Antimicrobials:  None  DVT prophylaxis: Coumadin   Objective: Vitals:   07/04/19 1117 07/04/19 1123 07/04/19 1533 07/04/19 1557  BP: 101/70     Pulse: 93   94  Resp: 18   18  Temp:  97.7 F (36.5 C) 97.7 F (36.5 C)   TempSrc:  Oral    SpO2: 100%   97%  Weight:      Height:        Intake/Output Summary (Last 24 hours) at 07/04/2019 1736 Last data filed at 07/04/2019 1500 Gross per 24 hour  Intake 2457.17 ml  Output 225 ml  Net 2232.17 ml   Filed Weights   07/02/19 0700 07/02/19 1130 07/03/19 0300  Weight: 97.1 kg 95.6 kg 94.5 kg    Exam:  General: NAD, chronically ill-appearing, trach collar in place  Cardiovascular: S1, S2 present  Respiratory: CTAB  Abdomen: Soft, nontender, nondistended, bowel sounds present, PEG tube noted  Musculoskeletal: No bilateral pedal edema noted  Skin: Normal  Psychiatry: Normal mood  Neurology: Awake, tracks, able to follow simple commands, moves all extremities   Data Reviewed: CBC: Recent Labs  Lab 06/30/19 0252 07/01/19 0630 07/02/19 0342 07/03/19 0313 07/04/19 0313  WBC 12.9* 12.5* 10.5 10.3 8.4  HGB 10.3* 11.2* 11.0* 11.4* 9.9*  HCT 34.8* 38.3* 37.6* 39.0  33.0*  MCV 102.7* 100.3* 100.8* 101.3* 98.5  PLT 346 351 381 379 3196  Basic Metabolic Panel: Recent Labs  Lab 06/28/19 0518 06/28/19 0518 06/28/19 1437 06/28/19 1437 06/29/19 0334 06/29/19 0334 06/29/19 1537 06/29/19 1537 06/30/19 0252 07/01/19 0630 07/02/19 0342 07/03/19 0313 07/04/19 0313  NA 137   < > 137   < > 136   < > 136   < > 136 139 137 135 136  K 4.7   < > 4.6   < > 4.5   < > 4.9   < > 3.7 3.8 3.8 4.1 3.9  CL 99   < > 99   < > 98   < > 100   < > 95* 97* 96* 94* 95*  CO2 22   < > 21*   < > 20*   < > 20*   < > 24 20* 20* 23 21*  GLUCOSE 96   < > 99   < > 105*   < > 104*   < > 126* 78 138* 119* 195*  BUN 41*   < > 42*   < > 41*   < > 42*   < > 43* 72* 104* 38* 78*  CREATININE  1.51*   < > 1.44*   < > 1.43*   < > 1.43*   < > 1.53* 3.37* 4.74* 3.79* 6.20*  CALCIUM 9.2   < > 9.0   < > 9.4   < > 9.2   < > 9.4 10.4* 9.9 10.0 9.5  MG 2.4  --   --   --  2.6*  --   --   --   --  3.1* 3.3*  --   --   PHOS 2.7  --  2.8  --  2.5  --  2.6  --  3.1  --   --   --   --    < > = values in this interval not displayed.   GFR: Estimated Creatinine Clearance: 18.8 mL/min (A) (by C-G formula based on SCr of 6.2 mg/dL (H)). Liver Function Tests: Recent Labs  Lab 06/28/19 0518 06/28/19 1437 06/29/19 0334 06/29/19 1537 06/30/19 0252  ALBUMIN 2.3* 2.5* 2.6* 2.4* 2.7*   No results for input(s): LIPASE, AMYLASE in the last 168 hours. No results for input(s): AMMONIA in the last 168 hours. Coagulation Profile: Recent Labs  Lab 07/01/19 0630 07/02/19 0342 07/03/19 0313 07/04/19 0313 07/04/19 1427  INR 1.7* 2.0* 2.3* 3.5* 2.5*   Cardiac Enzymes: No results for input(s): CKTOTAL, CKMB, CKMBINDEX, TROPONINI in the last 168 hours. BNP (last 3 results) No results for input(s): PROBNP in the last 8760 hours. HbA1C: No results for input(s): HGBA1C in the last 72 hours. CBG: Recent Labs  Lab 07/03/19 2348 07/04/19 0412 07/04/19 0743 07/04/19 1124 07/04/19 1531  GLUCAP 162*  216* 193* 130* 174*   Lipid Profile: No results for input(s): CHOL, HDL, LDLCALC, TRIG, CHOLHDL, LDLDIRECT in the last 72 hours. Thyroid Function Tests: No results for input(s): TSH, T4TOTAL, FREET4, T3FREE, THYROIDAB in the last 72 hours. Anemia Panel: No results for input(s): VITAMINB12, FOLATE, FERRITIN, TIBC, IRON, RETICCTPCT in the last 72 hours. Urine analysis:    Component Value Date/Time   COLORURINE AMBER (A) 04/23/2019 0930   APPEARANCEUR CLOUDY (A) 04/23/2019 0930   LABSPEC 1.025 04/23/2019 0930   PHURINE 6.0 04/23/2019 0930   GLUCOSEU 50 (A) 04/23/2019 0930   HGBUR LARGE (A) 04/23/2019 0930   BILIRUBINUR NEGATIVE 04/23/2019 0930   KETONESUR NEGATIVE 04/23/2019 0930   PROTEINUR >=300 (A) 04/23/2019 0930   NITRITE NEGATIVE 04/23/2019 0930   LEUKOCYTESUR NEGATIVE 04/23/2019 0930   Sepsis Labs: _0 (procalcitonin:4,lacticidven:4)  ) Recent Results (from the past 240 hour(s))  Culture, respiratory (non-expectorated)     Status: None   Collection Time: 06/26/19 11:38 AM   Specimen: Tracheal Aspirate; Respiratory  Result Value Ref Range Status   Specimen Description TRACHEAL ASPIRATE  Final   Special Requests NONE  Final   Gram Stain   Final    RARE WBC PRESENT,BOTH PMN AND MONONUCLEAR NO ORGANISMS SEEN    Culture   Final    Consistent with normal respiratory flora. Performed at [facility], [place] [address], [place], [place] [address]    Report Status 06/28/2019 FINAL  Final  Culture, blood (Routine X 2) w Reflex to ID Panel     Status: None   Collection Time: 06/27/19  3:45 PM   Specimen: BLOOD LEFT HAND  Result Value Ref Range Status   Specimen Description BLOOD LEFT HAND  Final   Special Requests   Final    BOTTLES DRAWN AEROBIC ONLY Blood Culture results may not be optimal due to an inadequate volume of  blood received in culture bottles   Culture   Final    NO GROWTH 5 DAYS Performed at [facility], [place] [address] [place], [place]  [address]    Report Status 07/02/2019 FINAL  Final      Studies: IR Replc Gastro/Colonic Tube Percut W/Fluoro  Result Date: 07/04/2019 INDICATION: History dysphagia, post percutaneous gastrostomy tube placement 06/23/2019 Patient with altered mental status and removed the gastrostomy tube. Gastrostomy track has been maintained with placement of a Foley catheter. Patient presents now for definitive fluoroscopic guided exchange and replacement EXAM: FLUOROSCOPIC GUIDED REPLACEMENT OF GASTROSTOMY TUBE COMPARISON:  Image guided gastrostomy tube placement-06/23/2019 MEDICATIONS: None. CONTRAST:  36m OMNIPAQUE IOHEXOL 300 MG/ML SOLN administered into the gastric lumen FLUOROSCOPY TIME:  18 seconds (1.4 mGy) COMPLICATIONS: None immediate. PROCEDURE: Patient was positioned supine on the fluoroscopy table The existing Foley catheter, utilized to maintain the patency of the gastrostomy track, was injected with a small amount of contrast demonstrating appropriate position functionality The external portion of the Foley catheter was cut and removed intact. Next, a 150French balloon retention gastrostomy tube was inserted via the chronic gastrostomy tube track, insufflated with approximately 10 cc of saline and dilute contrast and pulled against the anterior inner aspect of the gastric lumen. The external disc was cinched. Contrast injection demonstrated appropriate position functionality of the new balloon retention gastrostomy tube. A dressing was applied. The patient tolerated the procedure well without immediate postprocedural complication. IMPRESSION: Successful fluoroscopic guided replacement of a new 18-French gastrostomy tube. The new gastrostomy tube is ready for immediate use. Electronically Signed   By: JSandi MariscalM.D.   On: 07/04/2019 14:03    Scheduled Meds: . ARIPiprazole  10 mg Per Tube Daily  . aspirin  81 mg Per Tube Daily  . atorvastatin  40 mg Per Tube q1800  . chlorhexidine gluconate (MEDLINE  KIT)  15 mL Mouth Rinse BID  . Chlorhexidine Gluconate Cloth  6 each Topical Q0600  . Darbepoetin Alfa  150 mcg Intravenous Q Mon-1800  . feeding supplement (PRO-STAT SUGAR FREE 64)  60 mL Per Tube TID  . FLUoxetine  20 mg Per Tube Daily  . free water  100 mL Per Tube Q4H  . haloperidol lactate      . insulin aspart  0-20 Units Subcutaneous Q4H  . insulin detemir  15 Units Subcutaneous BID  . isosorbide-hydrALAZINE  1 tablet Per Tube BID  . mouth rinse  15 mL Mouth Rinse 10 times per day  . midodrine  10 mg Per Tube Q8H  . pantoprazole sodium  40 mg Per Tube Q1200  . QUEtiapine  50 mg Per Tube QHS  . sodium chloride flush  10-40 mL Intracatheter Q12H  . [START ON 07/05/2019] warfarin  7.5 mg Per Tube ONCE-1600  . Warfarin - Pharmacist Dosing Inpatient   Does not apply q1600    Continuous Infusions: . anticoagulant sodium citrate    . anticoagulant sodium citrate    . dextrose Stopped (07/03/19 1141)  . dextrose Stopped (07/02/19 1730)  . feeding supplement (VITAL 1.5 CAL) 55 mL/hr at 07/04/19 1101     LOS: 73 days     NAlma Friendly MD [facility]  If 7PM-7AM, please contact night-coverage [email or web address] 07/04/2019, 5:36 PM

## 2019-07-04 NOTE — Progress Notes (Signed)
Daily Progress Note   Patient Name: Carlos Brewer.       Date: 07/04/2019 DOB: 12/04/1985  Age: 34 y.o. MRN#: [id number] Attending Physician: Alma Friendly, MD Primary Care Physician: Patient, No Pcp Per Admit Date: 04/21/2019  Reason for Consultation/Follow-up: To discuss complex medical decision making related to patient's goals of care  Subjective: Talked with Langley Gauss about over all progress.  We agreed that she has a 200 lb toddler.  We discussed SLP results and plans for re-evaluation down the road.  Discussed using passy muir valve. Discussed need for life long hemodialysis. I'm concerned that outpatient hemodialysis may be a problem down the road.  Langley Gauss understands that she likely has a "forever baby".  She remains hopeful that his trach will one day be able to be safely removed, he will graduate from SNF rehab and live at home.  I supported her hopes and committed to follow along with LJ.  Length of Stay: 73   Vital Signs: BP 101/70   Pulse 94   Temp 97.7 F (36.5 C)   Resp 18   Ht 6\' 1"  (1.854 m)   Wt 94.5 kg   SpO2 97%   BMI 27.49 kg/m  SpO2: SpO2: 97 % O2 Device: O2 Device: Tracheostomy Collar O2 Flow Rate: O2 Flow Rate (L/min): 5 L/min       Palliative Assessment/Data: 40%     Palliative Care Plan    Recommendations/Plan:  PMT will continue to follow intermittently checking in for decline that would require and re-evaluation of goals.  Strongly recommend Palliative to follow outpatient when he discharges.  Chaplain support for [facility] appreciated.  Code Status:  Full code  Discharge Planning:  [place] for rehab with Palliative care service follow-up  Care plan was discussed with Mother.  Thank you for allowing the  Palliative Medicine Team to assist in the care of this patient.  Total time spent:  15 min.     Greater than 50%  of this time was spent counseling and coordinating care related to the above assessment and plan.  Florentina Jenny, PA-C Palliative Medicine  Please contact Palliative MedicineTeam phone at [phone] for questions and concerns between 7 am - 7 pm.   Please see AMION for individual provider pager numbers.

## 2019-07-04 NOTE — Procedures (Signed)
Pre procedural Dx: Dysphagia, poorly functioning feeding tube. Post procedural Dx: Same  Successful fluoroscopic guided replacement of exisitng 18 Fr gastrostomy tube.   The feeding tube is ready for immediate use.  EBL: None  Complications: None immediate.  Jay Chassie Pennix, MD Pager #: [phone]  

## 2019-07-04 NOTE — Progress Notes (Signed)
Admit: 05/06/2019 LOS: 64  39M originally presented with L MCA CVA s/p tPA and thrombectomy; lV Thrombus with sCHF/cardiogenic shock; VDRF req trach; additional CVA  Subjective:   . No interval events  04/18 0701 - 04/19 0700 In: 2069 [I.V.:409.4; NG/GT:1659.7] Out: 200 [Stool:200]  Filed Weights   07/02/19 0700 07/02/19 1130 07/03/19 0300  Weight: 97.1 kg 95.6 kg 94.5 kg    Scheduled Meds: . ARIPiprazole  10 mg Per Tube Daily  . aspirin  81 mg Per Tube Daily  . atorvastatin  40 mg Per Tube q1800  . chlorhexidine gluconate (MEDLINE KIT)  15 mL Mouth Rinse BID  . Chlorhexidine Gluconate Cloth  6 each Topical Q0600  . Darbepoetin Alfa  150 mcg Intravenous Q Mon-1800  . feeding supplement (PRO-STAT SUGAR FREE 64)  60 mL Per Tube TID  . FLUoxetine  20 mg Per Tube Daily  . free water  100 mL Per Tube Q4H  . insulin aspart  0-20 Units Subcutaneous Q4H  . insulin detemir  15 Units Subcutaneous BID  . isosorbide-hydrALAZINE  1 tablet Per Tube BID  . mouth rinse  15 mL Mouth Rinse 10 times per day  . midodrine  10 mg Per Tube Q8H  . pantoprazole sodium  40 mg Per Tube Q1200  . QUEtiapine  50 mg Per Tube QHS  . sodium chloride flush  10-40 mL Intracatheter Q12H  . Warfarin - Pharmacist Dosing Inpatient   Does not apply q1600   Continuous Infusions: . anticoagulant sodium citrate    . anticoagulant sodium citrate    . dextrose Stopped (07/03/19 1141)  . dextrose Stopped (07/02/19 1730)  . feeding supplement (VITAL 1.5 CAL) 1,000 mL (07/04/19 0500)   PRN Meds:.acetaminophen, ALPRAZolam, anticoagulant sodium citrate, anticoagulant sodium citrate, bisacodyl, fentaNYL (SUBLIMAZE) injection, lidocaine (PF), lidocaine-prilocaine, lip balm, loperamide HCl, ondansetron (ZOFRAN) IV, pentafluoroprop-tetrafluoroeth, polyethylene glycol, sodium chloride flush, white petrolatum  Current Labs: reviewed    Physical Exam:  Blood pressure 110/69, pulse (!) 101, temperature 98.5 F (36.9 C),  temperature source Axillary, resp. rate 19, height 6' 1"  (1.854 m), weight 94.5 kg, SpO2 100 %. Awake, tracks L IJ TDC present RRR  CTAB No sig LEE S/nt  A 1. Dialysis dependent AKI, anuric 2. Prolonged VDRF, trach present 3. S/p CVA at admission 4. Chronic sCHF 5. LV Thrombus 6. HIT on bivalirudin 7. Polymorphic VT, resolved 8. Anemia ESA qTues, Hb stable 9. CKD-BMD; stable #s  P . Cont HD on THS schedule:  2K 4h TDC 400/600, no heparin, UF 2L; citrate lock TDC . Medication Issues; o Preferred narcotic agents for pain control are hydromorphone, fentanyl, and methadone. Morphine should not be used.  o Baclofen should be avoided o Avoid oral sodium phosphate and magnesium citrate based laxatives / bowel preps    [name] MD 07/04/2019, 8:39 AM  Recent Labs  Lab 06/29/19 0334 06/29/19 0334 06/29/19 1537 06/29/19 1537 06/30/19 0252 07/01/19 0630 07/02/19 0342 07/03/19 0313 07/04/19 0313  NA 136   < > 136   < > 136   < > 137 135 136  K 4.5   < > 4.9   < > 3.7   < > 3.8 4.1 3.9  CL 98   < > 100   < > 95*   < > 96* 94* 95*  CO2 20*   < > 20*   < > 24   < > 20* 23 21*  GLUCOSE 105*   < > 104*   < >  126*   < > 138* 119* 195*  BUN 41*   < > 42*   < > 43*   < > 104* 38* 78*  CREATININE 1.43*   < > 1.43*   < > 1.53*   < > 4.74* 3.79* 6.20*  CALCIUM 9.4   < > 9.2   < > 9.4   < > 9.9 10.0 9.5  PHOS 2.5  --  2.6  --  3.1  --   --   --   --    < > = values in this interval not displayed.   Recent Labs  Lab 07/02/19 0342 07/03/19 0313 07/04/19 0313  WBC 10.5 10.3 8.4  HGB 11.0* 11.4* 9.9*  HCT 37.6* 39.0 33.0*  MCV 100.8* 101.3* 98.5  PLT [phone]

## 2019-07-04 NOTE — Consult Note (Signed)
Christopher Nurse wound follow up Patient receiving care in [facility] 2M05. Wound type: Healing left heel PI Measurement: 1.2 cm x 0.6 cm x no measurable depth Wound bed: 100% pink Drainage (amount, consistency, odor) none Periwound: intact Dressing procedure/placement/frequency: Continue current topical therapy of Xeroform and foam. Monitor the wound area(s) for worsening of condition such as: Signs/symptoms of infection,  Increase in size,  Development of or worsening of odor, Development of pain, or increased pain at the affected locations.  Notify the medical team if any of these develop. Val Riles, RN, MSN, CWOCN, CNS-BC, pager [phone]

## 2019-07-04 NOTE — Progress Notes (Signed)
Physical Therapy Treatment Patient Details Name: Carlos Brewer. MRN: [id number] DOB: 1985/08/16 Today's Date: 07/04/2019    History of Present Illness Pt is a 34 y/o male smoker who initially presented on 2/5 with slurred speech and Rt sided weakness. Admitted with left MCA CVA and multiple smaller embolic infarcts s/p tPA and thrombectomy in IR.  Additionally found to have left ventricular  thrombus. Hospital course complicated by septic shock r/t HCAP, AKI requiring CRRT, polymorphic VT and cardiogenic shock. Echo EF < 20%. Pt with Intermittent pressor dependence, required tracheostomy. Tolerating trach collar 3/4, back on vent 3/5 with flash pulmonary edema.  Started IHD 3/10.  G-tube placed 06/23/19, pt removed and replaced 07/04/19.    PT Comments    Patient with some improved affect after pushed in recliner around unit.  Still weak and at times not initiating, but more interactive with quicker responses noted shaking his head for questions about if he wanted to go out of room, keep legs down in chair, go back to bed, etc.  Still needing extensive assist for OOB, but pt taking his weight once up on his feet and seemed to want to participate in some seated exercises.  PT to follow acutely, goals updated this session.  Still appropriate for LTACH at d/c.    Follow Up Recommendations  LTACH     Equipment Recommendations  Other (comment)(TBA)    Recommendations for Other Services       Precautions / Restrictions Precautions Precautions: Fall Precaution Comments: trach collar, PEG w/abdominal binder, flexiseal    Mobility  Bed Mobility Overal bed mobility: Needs Assistance Bed Mobility: Rolling;Sidelying to Sit Rolling: Max assist;+2 for physical assistance Sidelying to sit: Max assist;+2 for physical assistance       General bed mobility comments: assist for initiation and completion, but pt participatory  Transfers Overall transfer level: Needs assistance Equipment  used: 2 person hand held assist Transfers: Sit to/from Stand Sit to Stand: Max assist;+2 physical assistance Stand pivot transfers: Max assist;+2 safety/equipment       General transfer comment: stood with lifting help of two and facilitation for upright trunk, short shuffling one foot to get to chair, then assist to lower safely.  Ambulation/Gait             General Gait Details: unable   Stairs             Wheelchair Mobility    Modified Rankin (Stroke Patients Only) Modified Rankin (Stroke Patients Only) Pre-Morbid Rankin Score: No symptoms Modified Rankin: Severe disability     Balance Overall balance assessment: Needs assistance Sitting-balance support: Feet supported Sitting balance-Leahy Scale: Poor Sitting balance - Comments: assist for keeping COG over BOS, pt listing to one side or back with occasional late effort to correct whether at EOB or at edge of chair   Standing balance support: Bilateral upper extremity supported Standing balance-Leahy Scale: Poor Standing balance comment: able to get upright with assist, standing wtih +2 for safety with min to mod support                            Cognition Arousal/Alertness: Awake/alert Behavior During Therapy: Flat affect Overall Cognitive Status: Impaired/Different from baseline Area of Impairment: Attention;Safety/judgement                   Current Attention Level: Sustained   Following Commands: Follows one step commands with increased time;Follows one step commands inconsistently Safety/Judgement:  Decreased awareness of safety;Decreased awareness of deficits   Problem Solving: Slow processing;Decreased initiation General Comments: following commands with increased time today and more quick to reply to questions with head nods      Exercises General Exercises - Upper Extremity Shoulder Horizontal ABduction: 5 reps;Seated;AAROM;Both Shoulder Horizontal ADduction: 5  reps;AAROM;Seated;Both General Exercises - Lower Extremity Hip Flexion/Marching: AAROM;10 reps;Seated;Both Other Exercises Other Exercises: seated trunk flex and ext using UE's versus abs with assist    General Comments General comments (skin integrity, edema, etc.): pushed pt in recliner around unit x 2 after RT set up portable trach collar O2.  Seemed more interactive with staff and patients.      Pertinent Vitals/Pain Faces Pain Scale: Hurts a little bit Pain Location: generalized Pain Descriptors / Indicators: Discomfort;Restless Pain Intervention(s): Monitored during session;Repositioned    Home Living                      Prior Function            PT Goals (current goals can now be found in the care plan section) Acute Rehab PT Goals Patient Stated Goal: to d/c home with mother after rehab PT Goal Formulation: With patient Time For Goal Achievement: 07/18/19 Potential to Achieve Goals: Fair Progress towards PT goals: Progressing toward goals(goals updated)    Frequency    Min 3X/week      PT Plan Current plan remains appropriate    Co-evaluation              AM-PAC PT "6 Clicks" Mobility   Outcome Measure  Help needed turning from your back to your side while in a flat bed without using bedrails?: Total Help needed moving from lying on your back to sitting on the side of a flat bed without using bedrails?: Total Help needed moving to and from a bed to a chair (including a wheelchair)?: Total Help needed standing up from a chair using your arms (e.g., wheelchair or bedside chair)?: Total Help needed to walk in hospital room?: Total Help needed climbing 3-5 steps with a railing? : Total 6 Click Score: 6    End of Session Equipment Utilized During Treatment: Oxygen(trach collar) Activity Tolerance: Patient tolerated treatment well Patient left: in chair;with call bell/phone within reach;with chair alarm set;with restraints reapplied(kept on  mitts throughout, RN stated OK to leave off wrist restraints) Nurse Communication: Mobility status;Other (comment)(pt scooting out in chair) PT Visit Diagnosis: Other abnormalities of gait and mobility (R26.89);Other symptoms and signs involving the nervous system (R29.898);Muscle weakness (generalized) (M62.81)     Time: 3329-5188 PT Time Calculation (min) (ACUTE ONLY): 48 min  Charges:  $Therapeutic Exercise: 8-22 mins $Therapeutic Activity: 23-37 mins                     Magda Kiel, [place] Acute Rehabilitation Services [phone] 07/04/2019    Reginia Naas 07/04/2019, 4:55 PM

## 2019-07-05 DIAGNOSIS — I639 Cerebral infarction, unspecified: Secondary | ICD-10-CM | POA: Diagnosis not present

## 2019-07-05 DIAGNOSIS — I63312 Cerebral infarction due to thrombosis of left middle cerebral artery: Secondary | ICD-10-CM | POA: Diagnosis not present

## 2019-07-05 DIAGNOSIS — J9601 Acute respiratory failure with hypoxia: Secondary | ICD-10-CM | POA: Diagnosis not present

## 2019-07-05 DIAGNOSIS — N179 Acute kidney failure, unspecified: Secondary | ICD-10-CM | POA: Diagnosis not present

## 2019-07-05 LAB — GLUCOSE, CAPILLARY
Glucose-Capillary: 173 mg/dL — ABNORMAL HIGH (ref 70–99)
Glucose-Capillary: 165 mg/dL — ABNORMAL HIGH (ref 70–99)
Glucose-Capillary: 118 mg/dL — ABNORMAL HIGH (ref 70–99)
Glucose-Capillary: 134 mg/dL — ABNORMAL HIGH (ref 70–99)
Glucose-Capillary: 174 mg/dL — ABNORMAL HIGH (ref 70–99)

## 2019-07-05 LAB — BASIC METABOLIC PANEL
Anion gap: 23 — ABNORMAL HIGH (ref 5–15)
BUN: 116 mg/dL — ABNORMAL HIGH (ref 6–20)
CO2: 19 mmol/L — ABNORMAL LOW (ref 22–32)
Calcium: 9.2 mg/dL (ref 8.9–10.3)
Chloride: 97 mmol/L — ABNORMAL LOW (ref 98–111)
Creatinine, Ser: 6.65 mg/dL — ABNORMAL HIGH (ref 0.61–1.24)
GFR calc Af Amer: 11 mL/min — ABNORMAL LOW (ref >60)
GFR calc non Af Amer: 10 mL/min — ABNORMAL LOW (ref >60)
Glucose, Bld: 190 mg/dL — ABNORMAL HIGH (ref 70–99)
Potassium: 4.6 mmol/L (ref 3.5–5.1)
Sodium: 139 mmol/L (ref 135–145)

## 2019-07-05 LAB — CBC
HCT: 33.9 % — ABNORMAL LOW (ref 39.0–52.0)
Hemoglobin: 10.1 g/dL — ABNORMAL LOW (ref 13.0–17.0)
MCH: 29.1 pg (ref 26.0–34.0)
MCHC: 29.8 g/dL — ABNORMAL LOW (ref 30.0–36.0)
MCV: 97.7 fL (ref 80.0–100.0)
Platelets: 358 10*3/uL (ref 150–400)
RBC: 3.47 MIL/uL — ABNORMAL LOW (ref 4.22–5.81)
RDW: 18.1 % — ABNORMAL HIGH (ref 11.5–15.5)
WBC: 7.3 10*3/uL (ref 4.0–10.5)
nRBC: 0 % (ref 0.0–0.2)

## 2019-07-05 LAB — PROTIME-INR
INR: 2.1 — ABNORMAL HIGH (ref 0.8–1.2)
Prothrombin Time: 23.4 seconds — ABNORMAL HIGH (ref 11.4–15.2)

## 2019-07-05 LAB — APTT: aPTT: 26 seconds (ref 24–36)

## 2019-07-05 MED ORDER — WARFARIN SODIUM 10 MG PO TABS: 10.0000 mg | ORAL_TABLET | Freq: Once | ORAL | Status: DC

## 2019-07-05 MED ORDER — WARFARIN SODIUM 2.5 MG PO TABS
12.5000 mg | ORAL_TABLET | Freq: Once | ORAL | Status: AC
  Administered 2019-07-05: 17:00:00 12.5 mg
  Filled 2019-07-05: qty 1

## 2019-07-05 MED ORDER — ALBUMIN HUMAN 25 % IV SOLN
INTRAVENOUS | Status: AC
  Administered 2019-07-05: 25 g
  Filled 2019-07-05: qty 200

## 2019-07-05 NOTE — Procedures (Signed)
I was present at this dialysis session. I have reviewed the session itself and made appropriate changes.   Seen on HD. Some ASx IDH, albumin x2 prn.  Rec midodrine this AM.  UF goal 2L. 2K bath.  TDC.  Hb 10.2.  Next HD 4/22  Filed Weights   07/02/19 0700 07/02/19 1130 07/03/19 0300  Weight: 97.1 kg 95.6 kg 94.5 kg    Recent Labs  Lab 06/30/19 0252 07/01/19 0630 07/05/19 0325  NA 136   < > 139  K 3.7   < > 4.6  CL 95*   < > 97*  CO2 24   < > 19*  GLUCOSE 126*   < > 190*  BUN 43*   < > 116*  CREATININE 1.53*   < > 6.65*  CALCIUM 9.4   < > 9.2  PHOS 3.1  --   --    < > = values in this interval not displayed.    Recent Labs  Lab 07/03/19 0313 07/04/19 0313 07/05/19 0325  WBC 10.3 8.4 7.3  HGB 11.4* 9.9* 10.1*  HCT 39.0 33.0* 33.9*  MCV 101.3* 98.5 97.7  PLT [phone]    Scheduled Meds: . ARIPiprazole  10 mg Per Tube Daily  . aspirin  81 mg Per Tube Daily  . atorvastatin  40 mg Per Tube q1800  . chlorhexidine gluconate (MEDLINE KIT)  15 mL Mouth Rinse BID  . Chlorhexidine Gluconate Cloth  6 each Topical Q0600  . Darbepoetin Alfa  150 mcg Intravenous Q Mon-1800  . feeding supplement (PRO-STAT SUGAR FREE 64)  60 mL Per Tube TID  . FLUoxetine  20 mg Per Tube Daily  . free water  100 mL Per Tube Q4H  . insulin aspart  0-20 Units Subcutaneous Q4H  . insulin detemir  15 Units Subcutaneous BID  . isosorbide-hydrALAZINE  1 tablet Per Tube BID  . mouth rinse  15 mL Mouth Rinse 10 times per day  . midodrine  10 mg Per Tube Q8H  . pantoprazole sodium  40 mg Per Tube Q1200  . QUEtiapine  50 mg Per Tube QHS  . sodium chloride flush  10-40 mL Intracatheter Q12H  . warfarin  10 mg Per Tube ONCE-1600  . Warfarin - Pharmacist Dosing Inpatient   Does not apply q1600   Continuous Infusions: . anticoagulant sodium citrate    . anticoagulant sodium citrate    . dextrose Stopped (07/03/19 1141)  . dextrose Stopped (07/02/19 1730)  . feeding supplement (VITAL 1.5 CAL) 55  mL/hr at 07/04/19 2100   PRN Meds:.acetaminophen, ALPRAZolam, anticoagulant sodium citrate, anticoagulant sodium citrate, bisacodyl, fentaNYL (SUBLIMAZE) injection, lidocaine (PF), lidocaine-prilocaine, lip balm, loperamide HCl, ondansetron (ZOFRAN) IV, pentafluoroprop-tetrafluoroeth, polyethylene glycol, sodium chloride flush, white petrolatum   [name]  MD 07/05/2019, 8:46 AM

## 2019-07-05 NOTE — Progress Notes (Addendum)
Occupational Therapy Treatment Patient Details Name: Carlos Brewer. MRN: [id number] DOB: [birth year] Today's Date: 07/05/2019    History of present illness Pt is a 34 y/o male smoker who initially presented on 2/5 with slurred speech and Rt sided weakness. Admitted with left MCA CVA and multiple smaller embolic infarcts s/p tPA and thrombectomy in IR.  Additionally found to have left ventricular  thrombus. Hospital course complicated by septic shock r/t HCAP, AKI requiring CRRT, polymorphic VT and cardiogenic shock. Echo EF < 20%. Pt with Intermittent pressor dependence, required tracheostomy. Tolerating trach collar 3/4, back on vent 3/5 with flash pulmonary edema.  Started IHD 3/10.  G-tube placed 06/23/19, pt removed and replaced 07/04/19.   OT comments  Session limited today as pt with increased agitation levels as session progressed. Pt not as responsive/engaging today in response to questions, but with improvements in command following and UE strength noted. Pt able to maintain focus on bil UE ROM/exercise for increased amount of time compared to previous sessions, though he does continue to require cues to carryout HEP through its entirety. Deferred further activity given pt agitation. Mother present and supportive throughout. Will continue per POC at this time.    Follow Up Recommendations  LTACH;Supervision/Assistance - 24 hour    Equipment Recommendations  Other (comment)(TBD)          Precautions / Restrictions Precautions Precautions: Fall Precaution Comments: trach collar, PEG w/abdominal binder, flexiseal, bil mitts and wrist restraints Restrictions Weight Bearing Restrictions: No       Mobility Bed Mobility Overal bed mobility: Needs Assistance             General bed mobility comments: maxA to reposition to midline in bed, egressed to modified chair position                   ADL either performed or assessed with clinical judgement   ADL  Overall ADL's : Needs assistance/impaired Eating/Feeding: NPO                                     General ADL Comments: pt remains totalA but with improved UE strength and command following today                       Cognition Arousal/Alertness: Awake/alert Behavior During Therapy: Restless;Agitated Overall Cognitive Status: Impaired/Different from baseline Area of Impairment: Attention;Safety/judgement                   Current Attention Level: Sustained Memory: Decreased recall of precautions;Decreased short-term memory Following Commands: Follows one step commands with increased time;Follows one step commands inconsistently Safety/Judgement: Decreased awareness of safety;Decreased awareness of deficits   Problem Solving: Slow processing;Decreased initiation General Comments: pt less engaging/responsive to questions today, noted with improved ability to carry out multiple reps of exercise today but continues to require repetition to fully attend to task. He became agitated towards end of session and making action/attempting to bite therapist         Exercises Exercises: General Upper Extremity;Hand exercises General Exercises - Upper Extremity Shoulder Flexion: AAROM;Both;10 reps Shoulder Horizontal ABduction: AAROM;Both;10 reps Shoulder Horizontal ADduction: AAROM;Both;10 reps Elbow Flexion: AAROM;Both;10 reps Elbow Extension: AAROM;Both;10 reps Hand Exercises Forearm Supination: AAROM;Both;10 reps Forearm Pronation: AAROM;Both;10 reps   Shoulder Instructions       General Comments VSS    Pertinent Vitals/ Pain  Pain Assessment: Faces Faces Pain Scale: Hurts a little bit Pain Location: generalized Pain Descriptors / Indicators: Discomfort;Restless Pain Intervention(s): Limited activity within patient's tolerance;Monitored during session  Home Living                                          Prior  Functioning/Environment              Frequency  Min 2X/week        Progress Toward Goals  OT Goals(current goals can now be found in the care plan section)  Progress towards OT goals: Progressing toward goals  Acute Rehab OT Goals Patient Stated Goal: to d/c home with mother after rehab OT Goal Formulation: With patient Time For Goal Achievement: 07/19/19 Potential to Achieve Goals: [place] Discharge plan remains appropriate    Co-evaluation                 AM-PAC OT "6 Clicks" Daily Activity     Outcome Measure   Help from another person eating meals?: Total Help from another person taking care of personal grooming?: Total Help from another person toileting, which includes using toliet, bedpan, or urinal?: Total Help from another person bathing (including washing, rinsing, drying)?: Total Help from another person to put on and taking off regular upper body clothing?: Total Help from another person to put on and taking off regular lower body clothing?: Total 6 Click Score: 6    End of Session Equipment Utilized During Treatment: Oxygen  OT Visit Diagnosis: Muscle weakness (generalized) (M62.81);Other abnormalities of gait and mobility (R26.89)   Activity Tolerance Treatment limited secondary to agitation;Patient tolerated treatment well   Patient Left in bed;with call bell/phone within reach;with restraints reapplied   Nurse Communication Mobility status        Time: 1546-1610 OT Time Calculation (min): 24 min  Charges: OT General Charges $OT Visit: 1 Visit OT Treatments $Therapeutic Activity: 23-37 mins  Lou Cal, OT Acute Rehabilitation Services Pager [phone] Office [phone]    Raymondo Band 07/05/2019, 5:24 PM

## 2019-07-05 NOTE — Progress Notes (Signed)
PROGRESS NOTE  Carlos Brewer. TSV:[id number] DOB: 02/10/1986 DOA: 05/09/2019 PCP: Patient, No Pcp Per  HPI/Recap of past 24 hours: HPI from PCCM 34 yo male smoker found to have slurred speech and Rt sided weakness. Admitted 2/5 with left MCA CVA and multiple smaller embolic infarcts>>>  tPA and thrombectomy in IR. Additionally found to have left ventricular thrombus. Course complicated by septic shock r/t HCAP, AKI requiring CRRT, polymorphic VT and cardiogenic shock.  Intermittent pressor dependence, required tracheostomy, intermittent hemodialysis, pontine CVA, Heart failure with reduced ejection fraction < 15% on last echocardiogram, Recurrent episodes of bilateral infiltrates and mechanical ventilation, ??Aspiration pneumonitis.  Patient has been somewhat stabilized in the ICU.  [facility] hospitalist to resume care on 07/03/2019.    Today, met patient during dialysis, responds by nodding appropriately, looks comfortable.      Assessment/Plan: Active Problems:   Occlusion of left middle cerebral artery   Middle cerebral artery embolism, left   Cerebrovascular accident (CVA) due to thrombosis of left middle cerebral artery (HCC)   Shock ([place])   Shock liver   Respiratory failure (HCC)   Pressure injury of skin   Tracheostomy tube present (HCC)   Acute CVA (cerebrovascular accident) ([place])   Acute kidney injury ([place])   Endotracheal tube present   Complication of tracheostomy tube ([place])   Acute hypoxemic respiratory failure (HCC)   Fever   Type 2 diabetes mellitus with other specified complication (HCC)   Hypoxia   HFrEF (heart failure with reduced ejection fraction) ([place])   Pneumothorax   Pneumomediastinum (HCC)   Hemoptysis   Palliative care encounter   Acute renal failure ([place])   Acute on chronic respiratory failure Tracheostomy with intermittent bleeding Stable Tolerating trach collar Pulmonary hygiene PCCM for trach/vent needs  End-stage  cardiomyopathy/chronic systolic HF/polymorphic VT/LV thrombus Followed by cardiology, signed off on 4/3 as patient is not a candidate for advanced heart failure therapies Continue hydralazine, midodrine, Coumadin Telemetry  End-stage renal failure Multifactorial including ATN following cardiogenic/septic shock Anuric without evidence of renal recovery Status post CRRT, stopped on 4/15 Currently tolerating HD Management by nephrology  Diabetes mellitus type 2 SSI, Lantus, Accu-Cheks, hypoglycemic protocol  HIT Continue Angiomax For the management by pharmacy  CVA on admission Continue ASA, Lipitor  Radiopaque density in abdomen ??  Ingested foreign body Serial KUBs  ICU delirium/intermittent agitation Noted to keep pulling out NG tube, PEG tube PEG tube replaced by IR on 07/04/2019 Continue Seroquel One-to-one sitter, restraints  Nutrition/dysphagia PEG tube replaced by IR on 07/04/2019 Continue tube feeds  Goals of care Very poor prognosis Palliative care on board-full scope of treatment       Malnutrition Type:  Nutrition Problem: Inadequate oral intake Etiology: inability to eat   Malnutrition Characteristics:  Signs/Symptoms: NPO status   Nutrition Interventions:  Interventions: Tube feeding, Prostat    Estimated body mass index is 27.49 kg/m as calculated from the following:   Height as of this encounter: 6' 1"  (1.854 m).   Weight as of this encounter: 94.5 kg.     Code Status: Full  Family Communication: Discussed with mother on 07/05/19  Disposition Plan: Patient has a long stay in the hospital, not yet medically stable for discharge, pending consultants signoff.  Plan for SNF vs LTACH placement. TOC on board, awaiting response from the [facility]   Consultants:  PCCM  Cardiology/EP  Nephrology  Neurology  IR  Palliative   Procedures: 2/5-2/6:  TPA given at 428 am (total of 90  Mg) 520 am went to IR  S/P Lt common carotid  arteriogram followed by complete revascularization of occluded LT MCA sup division mid M2 seg with x 1 pass with 43mx 40 mm solitaire X ret river device and penumbra aspiration with TICI3revascularization  ETT 2/05 >> 2/18 LIJ CVL 2/5 >>2/18  RT [place] CVL 2/18 >>3/2 RIJ HD cath 2/6 >>2/18 6 shiley cuffed trach 2/18  >>3/4  RT fem HD 2/19 >>3/2 RIJ permacath 3/2>> 3/4 shiley 4 uncuffed.... dislodged placed back 6cuffed shiley that evening 3/5: change to #6 cuffed distal XLT  3/17 right femoral CVL >> 3/23 3/17 right femoral arterial line >>3/23 3/31 bronchoscopy : airway clear, conclusion = end of trach up against the wall obstructing airflow   3/31 trach change to cuffed #6 XLT  4/7 PEG by IR  Antimicrobials:  None  DVT prophylaxis: Coumadin   Objective: Vitals:   07/05/19 1155 07/05/19 1200 07/05/19 1211 07/05/19 1300  BP:  101/71 101/67 96/82  Pulse: 91 91 92 95  Resp: 16 17 20 14   Temp:   98.4 F (36.9 C)   TempSrc:   Oral   SpO2: 100% 100%  100%  Weight:      Height:        Intake/Output Summary (Last 24 hours) at 07/05/2019 1430 Last data filed at 07/05/2019 1300 Gross per 24 hour  Intake 1065.16 ml  Output 900 ml  Net 165.16 ml   Filed Weights   07/02/19 0700 07/02/19 1130 07/03/19 0300  Weight: 97.1 kg 95.6 kg 94.5 kg    Exam:  General: NAD, chronically ill-appearing, trach collar in place  Cardiovascular: S1, S2 present  Respiratory: CTAB  Abdomen: Soft, nontender, nondistended, bowel sounds present, PEG tube noted  Musculoskeletal: No bilateral pedal edema noted  Skin: Normal  Psychiatry: Normal mood  Neurology: Awake, tracks, able to follow simple commands, moves all extremities   Data Reviewed: CBC: Recent Labs  Lab 07/01/19 0630 07/02/19 0342 07/03/19 0313 07/04/19 0313 07/05/19 0325  WBC 12.5* 10.5 10.3 8.4 7.3  HGB 11.2* 11.0* 11.4* 9.9* 10.1*  HCT 38.3* 37.6* 39.0 33.0* 33.9*  MCV 100.3* 100.8* 101.3* 98.5 97.7    PLT 351 381 379 389 3950  Basic Metabolic Panel: Recent Labs  Lab 06/28/19 1437 06/28/19 1437 06/29/19 0334 06/29/19 0334 06/29/19 1537 06/29/19 1537 06/30/19 0252 06/30/19 0252 07/01/19 0630 07/02/19 0342 07/03/19 0313 07/04/19 0313 07/05/19 0325  NA 137   < > 136   < > 136   < > 136   < > 139 137 135 136 139  K 4.6   < > 4.5   < > 4.9   < > 3.7   < > 3.8 3.8 4.1 3.9 4.6  CL 99   < > 98   < > 100   < > 95*   < > 97* 96* 94* 95* 97*  CO2 21*   < > 20*   < > 20*   < > 24   < > 20* 20* 23 21* 19*  GLUCOSE 99   < > 105*   < > 104*   < > 126*   < > 78 138* 119* 195* 190*  BUN 42*   < > 41*   < > 42*   < > 43*   < > 72* 104* 38* 78* 116*  CREATININE 1.44*   < > 1.43*   < > 1.43*   < > 1.53*   < >  3.37* 4.74* 3.79* 6.20* 6.65*  CALCIUM 9.0   < > 9.4   < > 9.2   < > 9.4   < > 10.4* 9.9 10.0 9.5 9.2  MG  --   --  2.6*  --   --   --   --   --  3.1* 3.3*  --   --   --   PHOS 2.8  --  2.5  --  2.6  --  3.1  --   --   --   --   --   --    < > = values in this interval not displayed.   GFR: Estimated Creatinine Clearance: 17.5 mL/min (A) (by C-G formula based on SCr of 6.65 mg/dL (H)). Liver Function Tests: Recent Labs  Lab 06/28/19 1437 06/29/19 0334 06/29/19 1537 06/30/19 0252  ALBUMIN 2.5* 2.6* 2.4* 2.7*   No results for input(s): LIPASE, AMYLASE in the last 168 hours. No results for input(s): AMMONIA in the last 168 hours. Coagulation Profile: Recent Labs  Lab 07/02/19 0342 07/03/19 0313 07/04/19 0313 07/04/19 1427 07/05/19 0325  INR 2.0* 2.3* 3.5* 2.5* 2.1*   Cardiac Enzymes: No results for input(s): CKTOTAL, CKMB, CKMBINDEX, TROPONINI in the last 168 hours. BNP (last 3 results) No results for input(s): PROBNP in the last 8760 hours. HbA1C: No results for input(s): HGBA1C in the last 72 hours. CBG: Recent Labs  Lab 07/04/19 1916 07/04/19 2314 07/05/19 0313 07/05/19 0725 07/05/19 1144  GLUCAP 209* 157* 173* 165* 118*   Lipid Profile: No results for  input(s): CHOL, HDL, LDLCALC, TRIG, CHOLHDL, LDLDIRECT in the last 72 hours. Thyroid Function Tests: No results for input(s): TSH, T4TOTAL, FREET4, T3FREE, THYROIDAB in the last 72 hours. Anemia Panel: No results for input(s): VITAMINB12, FOLATE, FERRITIN, TIBC, IRON, RETICCTPCT in the last 72 hours. Urine analysis:    Component Value Date/Time   COLORURINE AMBER (A) 04/23/2019 0930   APPEARANCEUR CLOUDY (A) 04/23/2019 0930   LABSPEC 1.025 04/23/2019 0930   PHURINE 6.0 04/23/2019 0930   GLUCOSEU 50 (A) 04/23/2019 0930   HGBUR LARGE (A) 04/23/2019 0930   BILIRUBINUR NEGATIVE 04/23/2019 0930   KETONESUR NEGATIVE 04/23/2019 0930   PROTEINUR >=300 (A) 04/23/2019 0930   NITRITE NEGATIVE 04/23/2019 0930   LEUKOCYTESUR NEGATIVE 04/23/2019 0930   Sepsis Labs: @LABRCNTIP (procalcitonin:4,lacticidven:4)  ) Recent Results (from the past 240 hour(s))  Culture, respiratory (non-expectorated)     Status: None   Collection Time: 06/26/19 11:38 AM   Specimen: Tracheal Aspirate; Respiratory  Result Value Ref Range Status   Specimen Description TRACHEAL ASPIRATE  Final   Special Requests NONE  Final   Gram Stain   Final    RARE WBC PRESENT,BOTH PMN AND MONONUCLEAR NO ORGANISMS SEEN    Culture   Final    Consistent with normal respiratory flora. Performed at [facility], [place] [address], [place], [place] [address]    Report Status 06/28/2019 FINAL  Final  Culture, blood (Routine X 2) w Reflex to ID Panel     Status: None   Collection Time: 06/27/19  3:45 PM   Specimen: BLOOD LEFT HAND  Result Value Ref Range Status   Specimen Description BLOOD LEFT HAND  Final   Special Requests   Final    BOTTLES DRAWN AEROBIC ONLY Blood Culture results may not be optimal due to an inadequate volume of blood received in culture bottles   Culture   Final    NO GROWTH 5 DAYS Performed  at [facility], [place] [address] [place], [place] [address]    Report Status 07/02/2019 FINAL  Final       Studies: No results found.  Scheduled Meds: . ARIPiprazole  10 mg Per Tube Daily  . aspirin  81 mg Per Tube Daily  . atorvastatin  40 mg Per Tube q1800  . chlorhexidine gluconate (MEDLINE KIT)  15 mL Mouth Rinse BID  . Chlorhexidine Gluconate Cloth  6 each Topical Q0600  . Darbepoetin Alfa  150 mcg Intravenous Q Mon-1800  . feeding supplement (PRO-STAT SUGAR FREE 64)  60 mL Per Tube TID  . FLUoxetine  20 mg Per Tube Daily  . free water  100 mL Per Tube Q4H  . insulin aspart  0-20 Units Subcutaneous Q4H  . insulin detemir  15 Units Subcutaneous BID  . isosorbide-hydrALAZINE  1 tablet Per Tube BID  . mouth rinse  15 mL Mouth Rinse 10 times per day  . midodrine  10 mg Per Tube Q8H  . pantoprazole sodium  40 mg Per Tube Q1200  . QUEtiapine  50 mg Per Tube QHS  . sodium chloride flush  10-40 mL Intracatheter Q12H  . warfarin  12.5 mg Per Tube ONCE-1600  . Warfarin - Pharmacist Dosing Inpatient   Does not apply q1600    Continuous Infusions: . anticoagulant sodium citrate    . anticoagulant sodium citrate    . dextrose Stopped (07/03/19 1141)  . dextrose Stopped (07/02/19 1730)  . feeding supplement (VITAL 1.5 CAL) 55 mL/hr at 07/04/19 2100     LOS: 74 days     Alma Friendly, MD [facility]  If 7PM-7AM, please contact night-coverage [email or web address] 07/05/2019, 2:30 PM

## 2019-07-05 NOTE — Progress Notes (Signed)
CSW attempted to reach patient's [facility] social worker Tye [place] at [phone] ext. [phone] without success - a voicemail was left requesting a return call.  Madilyn Fireman, MSW, LCSW-A Transitions of Care  Clinical Social Worker  [facility] Emergency Departments  Medical ICU [phone]

## 2019-07-05 NOTE — Progress Notes (Addendum)
ANTICOAGULATION CONSULT NOTE  Pharmacy Consult:  Bivalirudin>>Warfarin Indication: stroke 2/5, LV thrombus 2/8, HIT 2/15  Allergies  Allergen Reactions  . Heparin Other (See Comments)    Heparin induced thrombocytopenia. 2/15 HIT OD 1.692. 2/16 SRA positive-90.     Patient Measurements: Height: 6\' 1"  (185.4 cm) Weight: 94.5 kg (208 lb 5.4 oz) IBW/kg (Calculated) : 79.9   Vital Signs: Temp: 97.9 F (36.6 C) (04/20 0346) Temp Source: Axillary (04/20 0346) BP: 120/71 (04/20 0600) Pulse Rate: 89 (04/20 0400)  Labs: Recent Labs    07/03/19 0313 07/03/19 0313 07/04/19 0313 07/04/19 1427 07/05/19 0325  HGB 11.4*   < > 9.9*  --  10.1*  HCT 39.0  --  33.0*  --  33.9*  PLT 379  --  389  --  358  APTT 69*   < > 76* 45* 26  LABPROT 25.5*   < > 34.9* 26.9* 23.4*  INR 2.3*   < > 3.5* 2.5* 2.1*  CREATININE 3.79*  --  6.20*  --  6.65*   < > = values in this interval not displayed.    Estimated Creatinine Clearance: 17.5 mL/min (A) (by C-G formula based on SCr of 6.65 mg/dL (H)).  Assessment: 34 yr old male with history of ESRD on HD presented with left MCA infarct - received TPA and revascularization with IR on 2/5. On 2/8 found to have small LV apical thrombus on ECHO. Pharmacy consulted to dose IV heparin, which was switched to bivalirudin 2/15 for HIT. 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 SRA very positive at 61. Heparin allergy has appropriately been added to chart and updated with SRA results. Of note, patient is a very difficult stick, due to extensive scarring after being stuck so many times since admission.   Patient has transitioned to warfarin only. INR of 2.1 is on the lower end of goal, however did not receive last night's dose of 7.5 mg due to order entry. Will give a higher dose today to avoid INR dropping below goal in the next few days. H/H stable, platelets WNL.   Goal of Therapy:  INR 2-3 Monitor platelets by anticoagulation  protocol: Yes   Plan:   Warfarin 12.5 mg x1 Monitor daily INR, CBC Q72H Monitor for s/sx of bleeding  Lorel Monaco, PharmD PGY1 Ambulatory Care Resident [facility] # [phone]

## 2019-07-06 DIAGNOSIS — N179 Acute kidney failure, unspecified: Secondary | ICD-10-CM | POA: Diagnosis not present

## 2019-07-06 DIAGNOSIS — J9601 Acute respiratory failure with hypoxia: Secondary | ICD-10-CM | POA: Diagnosis not present

## 2019-07-06 DIAGNOSIS — Z452 Encounter for adjustment and management of vascular access device: Secondary | ICD-10-CM | POA: Diagnosis not present

## 2019-07-06 LAB — PROTIME-INR
INR: 2.2 — ABNORMAL HIGH (ref 0.8–1.2)
Prothrombin Time: 24.7 seconds — ABNORMAL HIGH (ref 11.4–15.2)

## 2019-07-06 LAB — CBC WITH DIFFERENTIAL/PLATELET
Abs Immature Granulocytes: 0.02 10*3/uL (ref 0.00–0.07)
Basophils Absolute: 0.1 10*3/uL (ref 0.0–0.1)
Basophils Relative: 1 %
Eosinophils Absolute: 0.2 10*3/uL (ref 0.0–0.5)
Eosinophils Relative: 3 %
HCT: 33.2 % — ABNORMAL LOW (ref 39.0–52.0)
Hemoglobin: 9.7 g/dL — ABNORMAL LOW (ref 13.0–17.0)
Immature Granulocytes: 0 %
Lymphocytes Relative: 18 %
Lymphs Abs: 1.3 10*3/uL (ref 0.7–4.0)
MCH: 29 pg (ref 26.0–34.0)
MCHC: 29.2 g/dL — ABNORMAL LOW (ref 30.0–36.0)
MCV: 99.4 fL (ref 80.0–100.0)
Monocytes Absolute: 0.6 10*3/uL (ref 0.1–1.0)
Monocytes Relative: 8 %
Neutro Abs: 4.8 10*3/uL (ref 1.7–7.7)
Neutrophils Relative %: 70 %
Platelets: 364 10*3/uL (ref 150–400)
RBC: 3.34 MIL/uL — ABNORMAL LOW (ref 4.22–5.81)
RDW: 17.9 % — ABNORMAL HIGH (ref 11.5–15.5)
WBC: 6.8 10*3/uL (ref 4.0–10.5)
nRBC: 0 % (ref 0.0–0.2)

## 2019-07-06 LAB — BASIC METABOLIC PANEL
Anion gap: 17 — ABNORMAL HIGH (ref 5–15)
BUN: 55 mg/dL — ABNORMAL HIGH (ref 6–20)
CO2: 25 mmol/L (ref 22–32)
Calcium: 9.5 mg/dL (ref 8.9–10.3)
Chloride: 97 mmol/L — ABNORMAL LOW (ref 98–111)
Creatinine, Ser: 3.78 mg/dL — ABNORMAL HIGH (ref 0.61–1.24)
GFR calc Af Amer: 23 mL/min — ABNORMAL LOW (ref >60)
GFR calc non Af Amer: 19 mL/min — ABNORMAL LOW (ref >60)
Glucose, Bld: 126 mg/dL — ABNORMAL HIGH (ref 70–99)
Potassium: 4.5 mmol/L (ref 3.5–5.1)
Sodium: 139 mmol/L (ref 135–145)

## 2019-07-06 LAB — GLUCOSE, CAPILLARY
Glucose-Capillary: 139 mg/dL — ABNORMAL HIGH (ref 70–99)
Glucose-Capillary: 144 mg/dL — ABNORMAL HIGH (ref 70–99)
Glucose-Capillary: 145 mg/dL — ABNORMAL HIGH (ref 70–99)
Glucose-Capillary: 157 mg/dL — ABNORMAL HIGH (ref 70–99)
Glucose-Capillary: 164 mg/dL — ABNORMAL HIGH (ref 70–99)
Glucose-Capillary: 190 mg/dL — ABNORMAL HIGH (ref 70–99)

## 2019-07-06 MED ORDER — WARFARIN SODIUM 2.5 MG PO TABS
12.5000 mg | ORAL_TABLET | Freq: Once | ORAL | Status: DC
  Filled 2019-07-06: qty 1

## 2019-07-06 MED ORDER — CHLORHEXIDINE GLUCONATE CLOTH 2 % EX PADS
6.0000 | MEDICATED_PAD | Freq: Every day | CUTANEOUS | Status: DC
  Administered 2019-07-07 – 2019-07-09 (×3): 6 via TOPICAL

## 2019-07-06 MED ORDER — WARFARIN SODIUM 10 MG PO TABS
10.0000 mg | ORAL_TABLET | Freq: Once | ORAL | Status: AC
  Administered 2019-07-06: 10 mg
  Filled 2019-07-06: qty 1

## 2019-07-06 MED ORDER — PRO-STAT SUGAR FREE PO LIQD
60.0000 mL | Freq: Two times a day (BID) | ORAL | Status: DC
  Administered 2019-07-06 – 2019-07-09 (×7): 60 mL
  Filled 2019-07-06 (×7): qty 60

## 2019-07-06 MED ORDER — VITAL 1.5 CAL PO LIQD
1000.0000 mL | ORAL | Status: DC
  Administered 2019-07-06 – 2019-07-08 (×3): 1000 mL
  Filled 2019-07-06 (×6): qty 1000

## 2019-07-06 NOTE — Progress Notes (Signed)
Pt noted to have BP 94/71, 91/70 and BIDIL due. Opyd updated.

## 2019-07-06 NOTE — Progress Notes (Signed)
Physical Therapy Treatment Patient Details Name: Carlos Brewer. MRN: [id number] DOB: Aug 13, 1985 Today's Date: 07/06/2019    History of Present Illness Pt is a 34 y/o male smoker who initially presented on 2/5 with slurred speech and Rt sided weakness. Admitted with left MCA CVA and multiple smaller embolic infarcts s/p tPA and thrombectomy in IR.  Additionally found to have left ventricular  thrombus. Hospital course complicated by septic shock r/t HCAP, AKI requiring CRRT, polymorphic VT and cardiogenic shock. Echo EF < 20%. Pt with Intermittent pressor dependence, required tracheostomy. Tolerating trach collar 3/4, back on vent 3/5 with flash pulmonary edema.  Started IHD 3/10.  G-tube placed 06/23/19, pt removed and replaced 07/04/19.    PT Comments    Pt alert on arrival. Participative and expressive.  Expressed himself softly x2 with PMV on.  Emphasis on sitting balance and standing activity in the RW including pregait w/shifting and steping, before stand pivot to the chair.    Follow Up Recommendations  LTACH     Equipment Recommendations       Recommendations for Other Services       Precautions / Restrictions Precautions Precautions: Fall Precaution Comments: trach collar, PEG w/abdominal binder, flexiseal, bil mitts and wrist restraints    Mobility  Bed Mobility Overal bed mobility: Needs Assistance Bed Mobility: Rolling;Supine to Sit Rolling: Mod assist;Max assist;+2 for safety/equipment   Supine to sit: Max assist;+2 for safety/equipment     General bed mobility comments: cues and directional assist to overcome some of his apraxias.  Truncal assist forward and up to sit up at EOB.  Worked on assymetrical scoot with max assist with w/shift right.  Transfers Overall transfer level: Needs assistance Equipment used: 2 person hand held assist;Rolling walker (2 wheeled) Transfers: Sit to/from Stand Sit to Stand: Mod assist;Max assist;From elevated  surface(x3) Stand pivot transfers: Max assist;+2 physical assistance       General transfer comment: directional cues for standing and work on w/shift and stepping.  Pt then pivoted to the chair with 4 pivotal steps, RW and maximal assist for stability and w/shifting.  Ambulation/Gait             General Gait Details: unable   Stairs             Wheelchair Mobility    Modified Rankin (Stroke Patients Only) Modified Rankin (Stroke Patients Only) Modified Rankin: Severe disability     Balance Overall balance assessment: Needs assistance Sitting-balance support: Feet supported Sitting balance-Leahy Scale: Poor Sitting balance - Comments: worked at EOB on sitting balance in midline.  Pt could sit for seconds, but invariably would eventually fall slowly backward.     Standing balance-Leahy Scale: Poor Standing balance comment: sit to stand x3, with the initiation of pregait and standing activity.                            Cognition Arousal/Alertness: Awake/alert Behavior During Therapy: Restless Overall Cognitive Status: Impaired/Different from baseline                     Current Attention Level: Sustained   Following Commands: Follows one step commands with increased time Safety/Judgement: Decreased awareness of safety;Decreased awareness of deficits Awareness: Intellectual Problem Solving: Slow processing;Decreased initiation        Exercises Other Exercises Other Exercises: warm up resistive hip/knee flex/ext exercise    General Comments General comments (skin integrity, edema, etc.): vss  Pertinent Vitals/Pain Pain Assessment: Faces Faces Pain Scale: No hurt Pain Intervention(s): Monitored during session    Home Living                      Prior Function            PT Goals (current goals can now be found in the care plan section) Acute Rehab PT Goals Patient Stated Goal: to d/c home with mother after  rehab PT Goal Formulation: With patient Time For Goal Achievement: 07/18/19 Potential to Achieve Goals: Fair Progress towards PT goals: Progressing toward goals    Frequency    Min 3X/week      PT Plan Current plan remains appropriate    Co-evaluation              AM-PAC PT "6 Clicks" Mobility   Outcome Measure  Help needed turning from your back to your side while in a flat bed without using bedrails?: Total Help needed moving from lying on your back to sitting on the side of a flat bed without using bedrails?: Total Help needed moving to and from a bed to a chair (including a wheelchair)?: Total Help needed standing up from a chair using your arms (e.g., wheelchair or bedside chair)?: Total Help needed to walk in hospital room?: Total Help needed climbing 3-5 steps with a railing? : Total 6 Click Score: 6    End of Session Equipment Utilized During Treatment: Oxygen;Other (comment)(pt able to complete the session without use of sup. oxygen) Activity Tolerance: Patient tolerated treatment well Patient left: in chair;with call bell/phone within reach;with family/visitor present Nurse Communication: Mobility status PT Visit Diagnosis: Other abnormalities of gait and mobility (R26.89);Muscle weakness (generalized) (M62.81);Other symptoms and signs involving the nervous system (R29.898)     Time: 1353-1436 PT Time Calculation (min) (ACUTE ONLY): 43 min  Charges:  $Therapeutic Exercise: 8-22 mins $Therapeutic Activity: 23-37 mins                     07/06/2019  Ginger Carne., PT Acute Rehabilitation Services [phone]  (pager) [phone]  (office)   Tessie Fass Aeriel Boulay 07/06/2019, 5:00 PM

## 2019-07-06 NOTE — Progress Notes (Addendum)
ANTICOAGULATION CONSULT NOTE  Pharmacy Consult:  Bivalirudin>>Warfarin Indication: stroke 2/5, LV thrombus 2/8, HIT 2/15  Allergies  Allergen Reactions  . Heparin Other (See Comments)    Heparin induced thrombocytopenia. 2/15 HIT OD 1.692. 2/16 SRA positive-90.     Patient Measurements: Height: 6\' 1"  (185.4 cm) Weight: 94.5 kg (208 lb 5.4 oz) IBW/kg (Calculated) : 79.9   Vital Signs: Temp: 97.9 F (36.6 C) (04/21 0407) Temp Source: Oral (04/21 0407) BP: 109/68 (04/21 0600) Pulse Rate: 82 (04/21 0600)  Labs: Recent Labs    07/04/19 0313 07/04/19 0313 07/04/19 1427 07/05/19 0325 07/06/19 0302  HGB 9.9*   < >  --  10.1* 9.7*  HCT 33.0*  --   --  33.9* 33.2*  PLT 389  --   --  358 364  APTT 76*  --  45* 26  --   LABPROT 34.9*   < > 26.9* 23.4* 24.7*  INR 3.5*   < > 2.5* 2.1* 2.2*  CREATININE 6.20*  --   --  6.65* 3.78*   < > = values in this interval not displayed.    Estimated Creatinine Clearance: 30.8 mL/min (A) (by C-G formula based on SCr of 3.78 mg/dL (H)).  Assessment: 34 yr old male with history of ESRD on HD presented with left MCA infarct - received TPA and revascularization with IR on 2/5. On 2/8 found to have small LV apical thrombus on ECHO. Pharmacy consulted to dose IV heparin, which was switched to bivalirudin 2/15 for HIT. 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 SRA very positive at 90. Heparin allergy has appropriately been added to chart and updated with SRA results. Of note, patient is a very difficult stick, due to extensive scarring after being stuck so many times since admission.   INR therapeutic at 2.2. H/H stable, platelets WNL.   Goal of Therapy:  INR 2-3 Monitor platelets by anticoagulation protocol: Yes   Plan:   Warfarin 10 mg x1 Monitor daily INR, CBC Q72H Monitor for s/sx of bleeding  Lorel Monaco, PharmD PGY1 Ambulatory Care Resident [facility] # [phone]

## 2019-07-06 NOTE — Progress Notes (Signed)
PM PROGRESS NOTE  Patient was so restless in the chair, he slid down to a precarious position in less than 10 min.  Pt nodded YES to returning to bed, but he was too restless to try pivot in the RW, but needed a face to face assist.    07/06/19 1700  PT Visit Information  Last PT Received On 07/06/19  Assistance Needed +2  History of Present Illness Pt is a 34 y/o male smoker who initially presented on 2/5 with slurred speech and Rt sided weakness. Admitted with left MCA CVA and multiple smaller embolic infarcts s/p tPA and thrombectomy in IR.  Additionally found to have left ventricular  thrombus. Hospital course complicated by septic shock r/t HCAP, AKI requiring CRRT, polymorphic VT and cardiogenic shock. Echo EF < 20%. Pt with Intermittent pressor dependence, required tracheostomy. Tolerating trach collar 3/4, back on vent 3/5 with flash pulmonary edema.  Started IHD 3/10.  G-tube placed 06/23/19, pt removed and replaced 07/04/19.  Subjective Data  Patient Stated Goal to d/c home with mother after rehab  Precautions  Precautions Fall  Precaution Comments trach collar, PEG w/abdominal binder, flexiseal, bil mitts and wrist restraints  Pain Assessment  Pain Assessment Faces  Faces Pain Scale 0  Pain Intervention(s) Monitored during session  Cognition  Arousal/Alertness Awake/alert  Behavior During Therapy Restless  Overall Cognitive Status Impaired/Different from baseline  Area of Impairment Attention;Safety/judgement  Current Attention Level Sustained  Memory Decreased recall of precautions;Decreased short-term memory  Following Commands Follows one step commands with increased time  Safety/Judgement Decreased awareness of safety;Decreased awareness of deficits  Awareness Intellectual  Problem Solving Slow processing;Decreased initiation  Difficult to assess due to Tracheostomy  Bed Mobility  Overal bed mobility Needs Assistance  Bed Mobility Sit to Supine  Sit to supine Mod  assist;Max assist;+2 for physical assistance  General bed mobility comments pt was not interested in scooting up toward [facility] and being assisted to transition into bed.  Pt pushed back against assist to supine across the bed.  Max of 2 to reposition up in the bed.  Transfers  Overall transfer level Needs assistance  Transfers Stand Pivot Transfers  Stand pivot transfers Max assist;+2 safety/equipment  General transfer comment directional cues and face to face assist to stand and pivot.  Unable to get pt to back up to the bed.  Modified Rankin (Stroke Patients Only)  Modified Rankin 5  Balance  Sitting balance-Leahy Scale Poor  Standing balance-Leahy Scale Poor  PT - End of Session  Equipment Utilized During Treatment Oxygen  Activity Tolerance Patient tolerated treatment well  Patient left in bed;with call bell/phone within reach;with bed alarm set;with family/visitor present  Nurse Communication Mobility status   PT - Assessment/Plan  PT Plan Current plan remains appropriate  PT Visit Diagnosis Other abnormalities of gait and mobility (R26.89);Muscle weakness (generalized) (M62.81);Other symptoms and signs involving the nervous system (R29.898)  PT Frequency (ACUTE ONLY) Min 3X/week  Follow Up Recommendations LTACH  PT equipment Other (comment)  AM-PAC PT "6 Clicks" Mobility Outcome Measure (Version 2)  Help needed turning from your back to your side while in a flat bed without using bedrails? 1  Help needed moving from lying on your back to sitting on the side of a flat bed without using bedrails? 1  Help needed moving to and from a bed to a chair (including a wheelchair)? 1  Help needed standing up from a chair using your arms (e.g., wheelchair or bedside chair)? 1  Help needed to walk in hospital room? 1  Help needed climbing 3-5 steps with a railing?  1  6 Click Score 6  Consider Recommendation of Discharge To: CIR/SNF/LTACH  PT Goal Progression  Progress towards PT goals  Progressing toward goals  Acute Rehab PT Goals  PT Goal Formulation With patient  Time For Goal Achievement 07/18/19  Potential to Achieve Goals Fair  PT Time Calculation  PT Start Time (ACUTE ONLY) 1447  PT Stop Time (ACUTE ONLY) 1457  PT Time Calculation (min) (ACUTE ONLY) 10 min  PT General Charges  $$ ACUTE PT VISIT 1 Visit  PT Treatments  $Therapeutic Activity 8-22 mins    07/06/2019  Ginger Carne., PT Acute Rehabilitation Services [phone]  (pager) [phone]  (office)

## 2019-07-06 NOTE — Progress Notes (Signed)
CSW sent patient's clinical information to the [place] for review for consideration for LTACH placement. CSW was advised by [place] social worker Tye Maryland that the patient is not eligible for SNF placement due to lack of benefits.  Madilyn Fireman, MSW, LCSW-A Transitions of Care  Clinical Social Worker  [facility] Emergency Departments  Medical ICU [phone]

## 2019-07-06 NOTE — Care Management (Signed)
CM acknowledges consult for LTACH.  CM spoke with attending regarding parameters for [facility] acceptance and LTACH approval from [place].  CSW reached out to [place] to discuss facility options.  CM also reached out to both [facility] and [facility] to gauge appropriateness for and [facility] referral

## 2019-07-06 NOTE — Progress Notes (Signed)
Nutrition Follow-up  DOCUMENTATION CODES:   Not applicable  INTERVENTION:  Change tube feeding to: Vital 1.5 @ 60 ml/hr (1440 ml/day) via NG tube 60 ml Prostat BID  100 ml every 4 hours: 600 ml   Provides: 2560 kcal, 157 grams protein, and 1700 ml free water.   NUTRITION DIAGNOSIS:   Inadequate oral intake related to inability to eat as evidenced by NPO status.  Ongoing.  GOAL:   Patient will meet greater than or equal to 90% of their needs  Met with TF.   MONITOR:   TF tolerance  REASON FOR ASSESSMENT:   Ventilator    ASSESSMENT:   Pt with PMH of CHF, OSA, poorly controlled DM, cocaine abuse admitted with L MCA stroke s/p tPA and IR thrombectomy.  2/6 started on CRRT  2/18 s/p trach placement 3/10 CRRT stopped 3/12 iHD 3/17 febrile; developed septic shock on pressors, lines changed, on abx  3/29 s/p bronch, chest xray shows resolving LLL PNA due to aspiration with poor control of secretions  3/31 pt had rapid response on progressive unit, pt transferred to ICU s/p trach change and on currently on vent support 4/8 s/p PEG placement 4/9 transferred out of ICU 4/10 desaturations, too "unastable" for HD from respiratory standpoint 4/11 transferred back to ICU for CRRT 4/15 CRRT discontinued; return to [facility]; pt pulled PEG tube out 4/19 PEG tube replaced   Of note, from period of 4/15-4/19 after pt pulled his PEG tube, multiple placements of NG tubes were made with pt pulling them out. Foley catheter was placed at site of PEG tube to prevent closure.   Pt resumed iHD with UF goal of 2L. Last HD 4/20, UF 0.8L. Pt remains anuric.  I/O: +30,562.44m since admit  Labs: CBGs [id number]: Aranesp, Novolog, Levemir, Coumadin,  635mPro-stat TID, 10040mree water Q4H  Diet Order:   Diet Order            Diet NPO time specified Except for: Sips with Meds  Diet effective midnight              EDUCATION NEEDS:   No education needs have been  identified at this time  Skin:  Skin Assessment: Skin Integrity Issues: Skin Integrity Issues:: DTI, Other (Comment) DTI: L heel Stage II: NA Other: wound to L buttocks  Last BM:  4/19 (rectal tube)  Height:   Ht Readings from Last 1 Encounters:  05/13/2019 6' 1" (1.854 m)    Weight:   Wt Readings from Last 1 Encounters:  07/03/19 94.5 kg    Ideal Body Weight:  83.6 kg  BMI:  Body mass index is 27.49 kg/m.  Estimated Nutritional Needs:   Kcal:  2500-2700  Protein:  150-170 grams  Fluid:  2 L/day    AmaLarkin InaS, RD, LDN RD pager number and weekend/on-call pager number located in [place]

## 2019-07-06 NOTE — Progress Notes (Signed)
  Speech Language Pathology Treatment: Dysphagia;Passy Muir Speaking valve  Patient Details Name: Carlos Brewer. MRN: [id number] DOB: 04/29/1985 Today's Date: 07/06/2019 Time: 0488-8916 SLP Time Calculation (min) (ACUTE ONLY): 17 min  Assessment / Plan / Recommendation Clinical Impression  Jamyson alert, intermittently smiling and no family at bedside for skilled treatment for upper airway utilization with (PMV), motor speech abilities, language and swallow. Ensured cuff deflation (was slightly inflated), speaking valve donned for combined 10 minutes throughout session and pt able to imitate phonemes on 80% of attempts. Therapist provided tactile cues for labial closure with onset of phonation- pt could imitate labial closure but not in combination with vocalization- this is an improvement- with ST he been unable to produce vocalization in ST since 4/6.   Oral care followed by ice chip then teaspoon water trials with adequate oral control- suspect delayed initiation. No cough during session but heard coughing as therapist documenting. Pt had FEES 3/4, initiated a diet then had medical decline several days after with utilization of PEG since. He is now appropriate to re assess for po potential. Recommend MBS tomorrow.     HPI HPI: Pt is a 34 y/o male smoker who initially presented on 2/5 with slurred speech and Rt sided weakness. Admitted with left MCA CVA and multiple smaller embolic infarcts s/p tPA and thrombectomy in IR.  CT head 3/31: subacute to chronic infarctions of the frontal lobes, right cerebellar hemisphere, and pons.  Additionally found to have left ventricular  thrombus. Hospital course complicated by septic shock r/t HCAP, AKI requiring CRRT, polymorphic VT and cardiogenic shock. Echo EF < 20%. Pt with Intermittent pressor dependence, required tracheostomy 2/18.  Has been in and out of ICU and has required intermittent vent support. G-tube placed 06/23/19. Sent back to ICU 4/11  for CRRT.       SLP Plan  Continue with current plan of care;MBS;Goals updated       Recommendations  Diet recommendations: NPO Medication Administration: Via alternative means      Patient may use Passy-Muir Speech Valve: During all therapies with supervision PMSV Supervision: Full MD: Please consider changing trach tube to : Smaller size;Cuffless         Oral Care Recommendations: Oral care QID Follow up Recommendations: 24 hour supervision/assistance;LTACH;Skilled Nursing facility SLP Visit Diagnosis: Aphasia (R47.01);Aphonia (R49.1);Apraxia (R48.2);Cognitive communication deficit (R41.841);Dysphagia, unspecified (R13.10) Plan: Continue with current plan of care;MBS;Goals updated       GO                Houston Siren 07/06/2019, 9:51 AM

## 2019-07-06 NOTE — Progress Notes (Signed)
Admit: 05/01/2019 LOS: 47  56M originally presented with L MCA CVA s/p tPA and thrombectomy; lV Thrombus with sCHF/cardiogenic shock; VDRF req trach; additional CVA  Subjective:   . No interval events . HD yesterdaoy 0.8L  04/20 0701 - 04/21 0700 In: 545 [I.V.:10; NG/GT:495] Out: 1200 [Drains:100; Stool:300]  Filed Weights   07/02/19 0700 07/02/19 1130 07/03/19 0300  Weight: 97.1 kg 95.6 kg 94.5 kg    Scheduled Meds: . ARIPiprazole  10 mg Per Tube Daily  . aspirin  81 mg Per Tube Daily  . atorvastatin  40 mg Per Tube q1800  . chlorhexidine gluconate (MEDLINE KIT)  15 mL Mouth Rinse BID  . Chlorhexidine Gluconate Cloth  6 each Topical Q0600  . Darbepoetin Alfa  150 mcg Intravenous Q Mon-1800  . feeding supplement (PRO-STAT SUGAR FREE 64)  60 mL Per Tube TID  . FLUoxetine  20 mg Per Tube Daily  . free water  100 mL Per Tube Q4H  . insulin aspart  0-20 Units Subcutaneous Q4H  . insulin detemir  15 Units Subcutaneous BID  . isosorbide-hydrALAZINE  1 tablet Per Tube BID  . mouth rinse  15 mL Mouth Rinse 10 times per day  . midodrine  10 mg Per Tube Q8H  . pantoprazole sodium  40 mg Per Tube Q1200  . QUEtiapine  50 mg Per Tube QHS  . sodium chloride flush  10-40 mL Intracatheter Q12H  . warfarin  10 mg Per Tube ONCE-1600  . Warfarin - Pharmacist Dosing Inpatient   Does not apply q1600   Continuous Infusions: . anticoagulant sodium citrate    . anticoagulant sodium citrate    . dextrose Stopped (07/03/19 1141)  . dextrose Stopped (07/02/19 1730)   PRN Meds:.acetaminophen, ALPRAZolam, anticoagulant sodium citrate, anticoagulant sodium citrate, bisacodyl, fentaNYL (SUBLIMAZE) injection, lidocaine (PF), lidocaine-prilocaine, lip balm, loperamide HCl, ondansetron (ZOFRAN) IV, pentafluoroprop-tetrafluoroeth, polyethylene glycol, sodium chloride flush, white petrolatum  Current Labs: reviewed    Physical Exam:  Blood pressure 96/85, pulse 94, temperature 98.7 F (37.1 C),  temperature source Oral, resp. rate 18, height 6' 1"  (1.854 m), weight 94.5 kg, SpO2 98 %. Awake, tracks L IJ TDC present RRR  CTAB No sig LEE S/nt  A 1. Dialysis dependent AKI, anuric 2. Prolonged VDRF now trach present 3. S/p CVA at admission 4. Chronic sCHF 5. LV Thrombus 6. HIT on warfarin 7. Polymorphic VT, resolved 8. Anemia ESA qTues, Hb stable 9. CKD-BMD; stable #s  P . Cont HD on THS schedule:  2K 4h TDC 400/600, no heparin, UF 2L; citrate lock TDC . Medication Issues; o Preferred narcotic agents for pain control are hydromorphone, fentanyl, and methadone. Morphine should not be used.  o Baclofen should be avoided o Avoid oral sodium phosphate and magnesium citrate based laxatives / bowel preps    [name] MD 07/06/2019, 1:20 PM  Recent Labs  Lab 06/29/19 1537 06/29/19 1537 06/30/19 0252 07/01/19 0630 07/04/19 0313 07/05/19 0325 07/06/19 0302  NA 136   < > 136   < > 136 139 139  K 4.9   < > 3.7   < > 3.9 4.6 4.5  CL 100   < > 95*   < > 95* 97* 97*  CO2 20*   < > 24   < > 21* 19* 25  GLUCOSE 104*   < > 126*   < > 195* 190* 126*  BUN 42*   < > 43*   < > 78* 116* 55*  CREATININE 1.43*   < > 1.53*   < > 6.20* 6.65* 3.78*  CALCIUM 9.2   < > 9.4   < > 9.5 9.2 9.5  PHOS 2.6  --  3.1  --   --   --   --    < > = values in this interval not displayed.   Recent Labs  Lab 07/04/19 0313 07/05/19 0325 07/06/19 0302  WBC 8.4 7.3 6.8  NEUTROABS  --   --  4.8  HGB 9.9* 10.1* 9.7*  HCT 33.0* 33.9* 33.2*  MCV 98.5 97.7 99.4  PLT [phone]

## 2019-07-06 NOTE — Plan of Care (Signed)
  Problem: Skin Integrity: Goal: Risk for impaired skin integrity will decrease Note: Pt with flexiseal and sacral skin injuries, Pt has been turned throughout shift for pressure re-distribution. Prophylactic dressing such as alleyvn foam dressings applied to sacral aea and heels for ulcer prevention. HOB is at about 30-45 degrees. Sheets are dry and wrinkle free with one incontinence sheet. Patient hygiene care performed daily and as needed. Please see notes and flowsheets for additional details on interactions.     Pt remains with non-violent restraints, applied correctly, assessed and monitored every 2 hours per protocol. See flowsheet for further details.   Problem: Self-Care: Goal: Ability to communicate needs accurately will improve  Note: Patient with communication impairment due to tracheostomy. Has flat affect and wandering facial expression. Intermittently follows commands when asked and nods appropriately. RN and family will continue to interact with patient.

## 2019-07-06 NOTE — Progress Notes (Deleted)
Physical Therapy Treatment Patient Details Name: Carlos Brewer. MRN: [id number] DOB: 1986-01-18 Today's Date: 07/06/2019    History of Present Illness Pt is a 34 y/o male smoker who initially presented on 2/5 with slurred speech and Rt sided weakness. Admitted with left MCA CVA and multiple smaller embolic infarcts s/p tPA and thrombectomy in IR.  Additionally found to have left ventricular  thrombus. Hospital course complicated by septic shock r/t HCAP, AKI requiring CRRT, polymorphic VT and cardiogenic shock. Echo EF < 20%. Pt with Intermittent pressor dependence, required tracheostomy. Tolerating trach collar 3/4, back on vent 3/5 with flash pulmonary edema.  Started IHD 3/10.  G-tube placed 06/23/19, pt removed and replaced 07/04/19.    PT Comments    Pt so restless in the chair, he had slid down to a precarious position in less than 10 min.  Pt nodded YES to returning to bed, but he was too restless to try pivot in the RW, but needed a face to face assist.    Follow Up Recommendations  LTACH     Equipment Recommendations  Other (comment)    Recommendations for Other Services       Precautions / Restrictions Precautions Precautions: Fall Precaution Comments: trach collar, PEG w/abdominal binder, flexiseal, bil mitts and wrist restraints    Mobility  Bed Mobility Overal bed mobility: Needs Assistance Bed Mobility: Sit to Supine Rolling: Mod assist;Max assist;+2 for safety/equipment   Supine to sit: Max assist;+2 for safety/equipment Sit to supine: Mod assist;Max assist;+2 for physical assistance   General bed mobility comments: pt was not interested in scooting up toward [facility] and being assisted to transition into bed.  Pt pushed back against assist to supine across the bed.  Max of 2 to reposition up in the bed.  Transfers Overall transfer level: Needs assistance Equipment used: 2 person hand held assist;Rolling walker (2 wheeled) Transfers: Stand Pivot  Transfers Sit to Stand: Mod assist;Max assist;From elevated surface(x3) Stand pivot transfers: Max assist;+2 safety/equipment       General transfer comment: directional cues and face to face assist to stand and pivot.  Unable to get pt to back up to the bed.  Ambulation/Gait             General Gait Details: unable   Stairs             Wheelchair Mobility    Modified Rankin (Stroke Patients Only) Modified Rankin (Stroke Patients Only) Modified Rankin: Severe disability     Balance Overall balance assessment: Needs assistance Sitting-balance support: Feet supported Sitting balance-Leahy Scale: Poor Sitting balance - Comments: worked at EOB on sitting balance in midline.  Pt could sit for seconds, but invariably would eventually fall slowly backward.     Standing balance-Leahy Scale: Poor Standing balance comment: sit to stand x3, with the initiation of pregait and standing activity.                            Cognition Arousal/Alertness: Awake/alert Behavior During Therapy: Restless Overall Cognitive Status: Impaired/Different from baseline Area of Impairment: Attention;Safety/judgement                   Current Attention Level: Sustained Memory: Decreased recall of precautions;Decreased short-term memory Following Commands: Follows one step commands with increased time Safety/Judgement: Decreased awareness of safety;Decreased awareness of deficits Awareness: Intellectual Problem Solving: Slow processing;Decreased initiation        Exercises Other Exercises Other Exercises:  warm up resistive hip/knee flex/ext exercise    General Comments General comments (skin integrity, edema, etc.): vss      Pertinent Vitals/Pain Pain Assessment: Faces Faces Pain Scale: No hurt Pain Intervention(s): Monitored during session    Home Living                      Prior Function            PT Goals (current goals can now be found  in the care plan section) Acute Rehab PT Goals Patient Stated Goal: to d/c home with mother after rehab PT Goal Formulation: With patient Time For Goal Achievement: 07/18/19 Potential to Achieve Goals: Fair Progress towards PT goals: Progressing toward goals    Frequency    Min 3X/week      PT Plan Current plan remains appropriate    Co-evaluation              AM-PAC PT "6 Clicks" Mobility   Outcome Measure  Help needed turning from your back to your side while in a flat bed without using bedrails?: Total Help needed moving from lying on your back to sitting on the side of a flat bed without using bedrails?: Total Help needed moving to and from a bed to a chair (including a wheelchair)?: Total Help needed standing up from a chair using your arms (e.g., wheelchair or bedside chair)?: Total Help needed to walk in hospital room?: Total Help needed climbing 3-5 steps with a railing? : Total 6 Click Score: 6    End of Session Equipment Utilized During Treatment: Oxygen Activity Tolerance: Patient tolerated treatment well Patient left: in bed;with call bell/phone within reach;with bed alarm set;with family/visitor present Nurse Communication: Mobility status PT Visit Diagnosis: Other abnormalities of gait and mobility (R26.89);Muscle weakness (generalized) (M62.81);Other symptoms and signs involving the nervous system (R29.898)     Time: 9528-4132 PT Time Calculation (min) (ACUTE ONLY): 10 min  Charges:  $Therapeutic Exercise: 8-22 mins $Therapeutic Activity: 8-22 mins                     07/06/2019  Carlos Carne., PT Acute Rehabilitation Services [phone]  (pager) [phone]  (office)   Carlos Brewer 07/06/2019, 5:11 PM

## 2019-07-06 NOTE — Progress Notes (Signed)
PROGRESS NOTE    Carlos Brewer.  LMB:[id number] DOB: [birth year] DOA: 05/01/2019 PCP: Patient, No Pcp Per    Brief Narrative:  34 yo male smoker found to have slurred speech and Rt sided weakness. Admitted 2/5 with left MCA CVA and multiple smaller embolic infarcts>>>tPA and thrombectomy in IR. Additionally found to have left ventricular thrombus. Course complicated by septic shock r/t HCAP, AKI requiring CRRT, polymorphic VT and cardiogenic shock. Intermittent pressor dependence, required tracheostomy, intermittent hemodialysis, pontine CVA, Heart failure with reduced ejection fraction < 15% on last echocardiogram, Recurrent episodes of bilateral infiltrates and mechanical ventilation, ??Aspiration pneumonitis.  Patient has been somewhat stabilized in the ICU.  [facility] to resume care on 07/03/2019  Assessment & Plan:   Active Problems:   Occlusion of left middle cerebral artery   Middle cerebral artery embolism, left   Cerebrovascular accident (CVA) due to thrombosis of left middle cerebral artery (HCC)   Shock ([place])   Shock liver   Respiratory failure (HCC)   Pressure injury of skin   Tracheostomy tube present (HCC)   Acute CVA (cerebrovascular accident) ([place])   Acute kidney injury ([place])   Endotracheal tube present   Complication of tracheostomy tube ([place])   Acute hypoxemic respiratory failure (HCC)   Fever   Type 2 diabetes mellitus with other specified complication (HCC)   Hypoxia   HFrEF (heart failure with reduced ejection fraction) ([place])   Pneumothorax   Pneumomediastinum (HCC)   Hemoptysis   Palliative care encounter   Acute renal failure ([place])   Acute on chronic respiratory failure Tracheostomy with intermittent bleeding Stable Currently continued on trach collar, 28% FiO2 Pulmonary hygiene PCCM following for trach/vent needs  End-stage cardiomyopathy/chronic systolic HF/polymorphic VT/LV thrombus Followed by cardiology, signed off on 4/3  as patient is not a candidate for advanced heart failure therapies Continue hydralazine, midodrine, Coumadin with pharmacy to dose INR currently 2.2 Telemetry  End-stage renal failure Multifactorial including ATN following cardiogenic/septic shock Anuric without evidence of renal recovery thus far Status post CRRT, stopped on 4/15 Currently tolerating HD Cont HD as per nephrology  Diabetes mellitus type 2 SSI, Lantus, Accu-Cheks, hypoglycemic protocol Glycemic trends are stable  HIT Continue Angiomax For the management by pharmacy  CVA on admission Continue ASA, Lipitor as tolerated  Radiopaque density in abdomen Suspicion for ingested foreign body Serial KUBs with density perhaps in descending colon Would repeat abd xray in AM, most recent was on 4/18  ICU delirium/intermittent agitation Noted to keep pulling out NG tube, PEG tube PEG tube replaced by IR on 07/04/2019 Continue Seroquel as tolerated One-to-one sitter, restraints for patient safety  Nutrition/dysphagia PEG tube replaced by IR on 07/04/2019 Continue tube feeds as tolerated  Goals of care Very poor prognosis Palliative care on board-full scope of treatment   DVT prophylaxis: SCD's Code Status: Full Family Communication: Pt in room, family not at bedside  Status is: Inpatient  Remains inpatient appropriate because:Altered mental status and IV treatments appropriate due to intensity of illness or inability to take PO   Dispo: The patient is from: Home              Anticipated d/c is to: Complicated disposition currently being worked on by [facility]              Anticipated d/c date is: > 3 days              Patient currently is not medically stable to d/c.  Consultants:   PCCM  Cardiology/EP  Nephrology  Neurology  IR  Palliative  Procedures:  2/5-2/6:  TPA given at 428 am (total of 90 Mg) 520 am went to IR  S/P Lt common carotid arteriogram followed by complete  revascularization of occluded LT MCA sup division mid M2 seg with x 1 pass with 63mx 40 mm solitaire X ret river device and penumbra aspiration with TICI3revascularization  ETT 2/05 >> 2/18 LIJ CVL 2/5 >>2/18  RT [place] CVL 2/18 >>3/2 RIJ HD cath 2/6 >>2/18 6 shiley cuffed trach 2/18 >>3/4  RT fem HD 2/19 >>3/2 RIJ permacath 3/2>> 3/4 shiley 4 uncuffed.... dislodged placed back 6cuffed shiley that evening 3/5: change to #6 cuffed distal XLT  3/17 right femoral CVL >> 3/23 3/17 right femoral arterial line >>3/23 3/31 bronchoscopy : airway clear, conclusion = end of trach up against the wall obstructing airflow   3/31 trach change to cuffed #6 XLT  4/7 PEG by IR  Antimicrobials: Anti-infectives (From admission, onward)   Start     Dose/Rate Route Frequency Ordered Stop   06/28/19 1100  vancomycin (VANCOREADY) IVPB 1250 mg/250 mL  Status:  Discontinued     1,250 mg 166.7 mL/hr over 90 Minutes Intravenous Every 24 hours 06/27/19 0925 06/29/19 1011   06/27/19 1400  meropenem (MERREM) 1 g in sodium chloride 0.9 % 100 mL IVPB  Status:  Discontinued     1 g 200 mL/hr over 30 Minutes Intravenous Every 8 hours 06/27/19 0950 06/27/19 0952   06/27/19 1045  meropenem (MERREM) 1 g in sodium chloride 0.9 % 100 mL IVPB  Status:  Discontinued     1 g 200 mL/hr over 30 Minutes Intravenous Every 8 hours 06/27/19 0952 06/29/19 1011   06/27/19 1015  meropenem (MERREM) 1 g in sodium chloride 0.9 % 100 mL IVPB  Status:  Discontinued     1 g 200 mL/hr over 30 Minutes Intravenous Every 24 hours 06/27/19 0925 06/27/19 0950   06/27/19 1015  vancomycin (VANCOREADY) IVPB 2000 mg/400 mL     2,000 mg 200 mL/hr over 120 Minutes Intravenous  Once 06/27/19 0925 06/27/19 1246   06/23/19 1554  ceFAZolin (ANCEF) IVPB 2g/100 mL premix     over 30 Minutes  Continuous PRN 06/23/19 1554 06/23/19 1554   06/23/19 1544  ceFAZolin (ANCEF) 2-4 GM/100ML-% IVPB    Note to Pharmacy: PTamsen Snider  : cabinet override        06/23/19 1544 06/24/19 0359   06/18/19 1130  Ampicillin-Sulbactam (UNASYN) 3 g in sodium chloride 0.9 % 100 mL IVPB     3 g 200 mL/hr over 30 Minutes Intravenous Every 8 hours 06/18/19 1039 06/19/19 2119   06/16/19 1400  ceFEPIme (MAXIPIME) 2 g in sodium chloride 0.9 % 100 mL IVPB  Status:  Discontinued     2 g 200 mL/hr over 30 Minutes Intravenous Every 12 hours 06/16/19 1216 06/18/19 1010   06/16/19 1400  vancomycin (VANCOREADY) IVPB 1250 mg/250 mL  Status:  Discontinued     1,250 mg 166.7 mL/hr over 90 Minutes Intravenous Every 24 hours 06/16/19 1216 06/18/19 1010   06/16/19 1200  vancomycin (VANCOCIN) IVPB 1000 mg/200 mL premix  Status:  Discontinued     1,000 mg 200 mL/hr over 60 Minutes Intravenous Every T-Th-Sa (Hemodialysis) 06/15/19 0459 06/16/19 1216   06/15/19 2200  ceFEPIme (MAXIPIME) 1 g in sodium chloride 0.9 % 100 mL IVPB  Status:  Discontinued     1 g 200  mL/hr over 30 Minutes Intravenous Every 24 hours 06/15/19 0459 06/16/19 1216   06/15/19 0500  vancomycin (VANCOREADY) IVPB 2000 mg/400 mL     2,000 mg 200 mL/hr over 120 Minutes Intravenous  Once 06/15/19 0459 06/15/19 0802   06/15/19 0500  ceFEPIme (MAXIPIME) 2 g in sodium chloride 0.9 % 100 mL IVPB     2 g 200 mL/hr over 30 Minutes Intravenous  Once 06/15/19 0459 06/15/19 0941   06/04/19 2200  ceFEPIme (MAXIPIME) 1 g in sodium chloride 0.9 % 100 mL IVPB    Note to Pharmacy: Pharmacy may modify dose as appropriate for renal failure   1 g 200 mL/hr over 30 Minutes Intravenous Daily at bedtime 06/04/19 1347 06/06/19 2303   06/02/19 1200  anidulafungin (ERAXIS) 100 mg in sodium chloride 0.9 % 100 mL IVPB  Status:  Discontinued     100 mg 78 mL/hr over 100 Minutes Intravenous Every 24 hours 06/01/19 1049 06/04/19 1053   06/01/19 2200  ceFEPIme (MAXIPIME) 1 g in sodium chloride 0.9 % 100 mL IVPB  Status:  Discontinued    Note to Pharmacy: Pharmacy may modify dose as appropriate for renal failure   1 g 200 mL/hr over  30 Minutes Intravenous Daily at bedtime 06/01/19 1054 06/04/19 1053   06/01/19 1300  vancomycin (VANCOCIN) IVPB 1000 mg/200 mL premix     1,000 mg 200 mL/hr over 60 Minutes Intravenous  Once 06/01/19 1203 06/01/19 1401   06/01/19 1200  vancomycin (VANCOCIN) IVPB 1000 mg/200 mL premix  Status:  Discontinued     1,000 mg 200 mL/hr over 60 Minutes Intravenous Every M-W-F (Hemodialysis) 06/01/19 0428 06/04/19 1053   06/01/19 1100  anidulafungin (ERAXIS) 200 mg in sodium chloride 0.9 % 200 mL IVPB     200 mg 78 mL/hr over 200 Minutes Intravenous  Once 06/01/19 1049 06/01/19 2002   06/01/19 1045  anidulafungin (ERAXIS) 100 mg in sodium chloride 0.9 % 100 mL IVPB  Status:  Discontinued     100 mg 78 mL/hr over 100 Minutes Intravenous Every 24 hours 06/01/19 1038 06/01/19 1048   06/01/19 0300  ceFEPIme (MAXIPIME) 1,000 mg in sodium chloride 0.9 % 100 mL IVPB  Status:  Discontinued    Note to Pharmacy: Pharmacy may modify dose as appropriate for renal failure   1,000 mg 200 mL/hr over 30 Minutes Intravenous Daily at bedtime 06/01/19 0250 06/01/19 1054   06/01/19 0300  vancomycin (VANCOREADY) IVPB 2000 mg/400 mL     2,000 mg 200 mL/hr over 120 Minutes Intravenous  Once 06/01/19 0252 06/01/19 0843   05/27/19 1000  cefTRIAXone (ROCEPHIN) 2 g in sodium chloride 0.9 % 100 mL IVPB     2 g 200 mL/hr over 30 Minutes Intravenous Every 24 hours 05/27/19 0946 05/31/19 0952   05/17/19 0000  ceFAZolin (ANCEF) IVPB 2g/100 mL premix     2 g 200 mL/hr over 30 Minutes Intravenous To Radiology 05/16/19 1431 05/17/19 1556   05/09/19 1200  piperacillin-tazobactam (ZOSYN) IVPB 3.375 g     3.375 g 100 mL/hr over 30 Minutes Intravenous Every 6 hours 05/09/19 1029 05/10/19 1830   05/08/19 1800  piperacillin-tazobactam (ZOSYN) IVPB 3.375 g  Status:  Discontinued     3.375 g 12.5 mL/hr over 240 Minutes Intravenous Every 6 hours 05/08/19 1318 05/09/19 1029   05/07/19 1100  vancomycin (VANCOCIN) IVPB 1000 mg/200 mL  premix  Status:  Discontinued     1,000 mg 200 mL/hr over 60 Minutes Intravenous Every 24 hours  05/06/19 0949 05/09/19 1019   05/06/19 1030  piperacillin-tazobactam (ZOSYN) IVPB 3.375 g  Status:  Discontinued     3.375 g 12.5 mL/hr over 240 Minutes Intravenous Every 6 hours 05/06/19 0949 05/08/19 1318   05/06/19 1000  vancomycin (VANCOREADY) IVPB 2000 mg/400 mL     2,000 mg 200 mL/hr over 120 Minutes Intravenous  Once 05/06/19 0949 05/06/19 1240   04/24/19 0600  meropenem (MERREM) 1 g in sodium chloride 0.9 % 100 mL IVPB     1 g 200 mL/hr over 30 Minutes Intravenous Every 8 hours 04/23/19 2024 04/29/19 2138   04/23/19 2000  meropenem (MERREM) 2 g in sodium chloride 0.9 % 100 mL IVPB     2 g 200 mL/hr over 30 Minutes Intravenous Every 8 hours 04/23/19 1956 04/23/19 2057   04/23/19 1000  vancomycin (VANCOREADY) IVPB 1250 mg/250 mL  Status:  Discontinued     1,250 mg 166.7 mL/hr over 90 Minutes Intravenous Every 24 hours 04/23/19 0907 04/26/19 1003   04/23/19 1000  piperacillin-tazobactam (ZOSYN) IVPB 3.375 g  Status:  Discontinued     3.375 g 12.5 mL/hr over 240 Minutes Intravenous Every 8 hours 04/23/19 0907 04/23/19 1955   04/18/2019 0549  ceFAZolin (ANCEF) 2-4 GM/100ML-% IVPB    Note to Pharmacy: Fredric Dine   : cabinet override      04/21/2019 0549 04/29/2019 1759       Subjective: Difficult to assess. Seems to nod when asked questions. Remains in restraints  Objective: Vitals:   07/06/19 0728 07/06/19 0800 07/06/19 0900 07/06/19 1114  BP: 110/77 95/75 97/72  96/85  Pulse: 95   94  Resp: (!) 28 (!) 27 18 18   Temp: 98.7 F (37.1 C)     TempSrc: Oral     SpO2: 98% 100%  98%  Weight:      Height:        Intake/Output Summary (Last 24 hours) at 07/06/2019 1432 Last data filed at 07/06/2019 1000 Gross per 24 hour  Intake 1385 ml  Output 400 ml  Net 985 ml   Filed Weights   07/02/19 0700 07/02/19 1130 07/03/19 0300  Weight: 97.1 kg 95.6 kg 94.5 kg     Examination:  General exam: Appears calm and comfortable  Respiratory system: Clear to auscultation. Respiratory effort normal. Trach in place Cardiovascular system: S1 & S2 heard, Regular Gastrointestinal system: Abdomen is nondistended, PEG in place, pos bs Central nervous system: Alert. No tremors or seizures Extremities: Symmetric 5 x 5 power. Skin: No rashes, lesions Psychiatry: Unable to assess given current mentation  Data Reviewed: I have personally reviewed following labs and imaging studies  CBC: Recent Labs  Lab 07/02/19 0342 07/03/19 0313 07/04/19 0313 07/05/19 0325 07/06/19 0302  WBC 10.5 10.3 8.4 7.3 6.8  NEUTROABS  --   --   --   --  4.8  HGB 11.0* 11.4* 9.9* 10.1* 9.7*  HCT 37.6* 39.0 33.0* 33.9* 33.2*  MCV 100.8* 101.3* 98.5 97.7 99.4  PLT 381 379 389 358 010   Basic Metabolic Panel: Recent Labs  Lab 06/29/19 1537 06/29/19 1537 06/30/19 0252 06/30/19 0252 07/01/19 0630 07/01/19 0630 07/02/19 0342 07/03/19 0313 07/04/19 0313 07/05/19 0325 07/06/19 0302  NA 136   < > 136   < > 139   < > 137 135 136 139 139  K 4.9   < > 3.7   < > 3.8   < > 3.8 4.1 3.9 4.6 4.5  CL 100   < >  95*   < > 97*   < > 96* 94* 95* 97* 97*  CO2 20*   < > 24   < > 20*   < > 20* 23 21* 19* 25  GLUCOSE 104*   < > 126*   < > 78   < > 138* 119* 195* 190* 126*  BUN 42*   < > 43*   < > 72*   < > 104* 38* 78* 116* 55*  CREATININE 1.43*   < > 1.53*   < > 3.37*   < > 4.74* 3.79* 6.20* 6.65* 3.78*  CALCIUM 9.2   < > 9.4   < > 10.4*   < > 9.9 10.0 9.5 9.2 9.5  MG  --   --   --   --  3.1*  --  3.3*  --   --   --   --   PHOS 2.6  --  3.1  --   --   --   --   --   --   --   --    < > = values in this interval not displayed.   GFR: Estimated Creatinine Clearance: 30.8 mL/min (A) (by C-G formula based on SCr of 3.78 mg/dL (H)). Liver Function Tests: Recent Labs  Lab 06/29/19 1537 06/30/19 0252  ALBUMIN 2.4* 2.7*   No results for input(s): LIPASE, AMYLASE in the last 168  hours. No results for input(s): AMMONIA in the last 168 hours. Coagulation Profile: Recent Labs  Lab 07/03/19 0313 07/04/19 0313 07/04/19 1427 07/05/19 0325 07/06/19 0302  INR 2.3* 3.5* 2.5* 2.1* 2.2*   Cardiac Enzymes: No results for input(s): CKTOTAL, CKMB, CKMBINDEX, TROPONINI in the last 168 hours. BNP (last 3 results) No results for input(s): PROBNP in the last 8760 hours. HbA1C: No results for input(s): HGBA1C in the last 72 hours. CBG: Recent Labs  Lab 07/05/19 2004 07/06/19 0002 07/06/19 0404 07/06/19 0743 07/06/19 1135  GLUCAP 174* 164* 139* 157* 190*   Lipid Profile: No results for input(s): CHOL, HDL, LDLCALC, TRIG, CHOLHDL, LDLDIRECT in the last 72 hours. Thyroid Function Tests: No results for input(s): TSH, T4TOTAL, FREET4, T3FREE, THYROIDAB in the last 72 hours. Anemia Panel: No results for input(s): VITAMINB12, FOLATE, FERRITIN, TIBC, IRON, RETICCTPCT in the last 72 hours. Sepsis Labs: No results for input(s): PROCALCITON, LATICACIDVEN in the last 168 hours.  Recent Results (from the past 240 hour(s))  Culture, blood (Routine X 2) w Reflex to ID Panel     Status: None   Collection Time: 06/27/19  3:45 PM   Specimen: BLOOD LEFT HAND  Result Value Ref Range Status   Specimen Description BLOOD LEFT HAND  Final   Special Requests   Final    BOTTLES DRAWN AEROBIC ONLY Blood Culture results may not be optimal due to an inadequate volume of blood received in culture bottles   Culture   Final    NO GROWTH 5 DAYS Performed at [facility], [place] [address], [place], [place] [address]    Report Status 07/02/2019 FINAL  Final     Radiology Studies: No results found.  Scheduled Meds: . ARIPiprazole  10 mg Per Tube Daily  . aspirin  81 mg Per Tube Daily  . atorvastatin  40 mg Per Tube q1800  . chlorhexidine gluconate (MEDLINE KIT)  15 mL Mouth Rinse BID  . Chlorhexidine Gluconate Cloth  6 each Topical Q0600  . [START ON 07/07/2019] Chlorhexidine  Gluconate Cloth  6 each Topical Q0600  . Darbepoetin Alfa  150 mcg Intravenous Q Mon-1800  . feeding supplement (PRO-STAT SUGAR FREE 64)  60 mL Per Tube TID  . FLUoxetine  20 mg Per Tube Daily  . free water  100 mL Per Tube Q4H  . insulin aspart  0-20 Units Subcutaneous Q4H  . insulin detemir  15 Units Subcutaneous BID  . isosorbide-hydrALAZINE  1 tablet Per Tube BID  . mouth rinse  15 mL Mouth Rinse 10 times per day  . midodrine  10 mg Per Tube Q8H  . pantoprazole sodium  40 mg Per Tube Q1200  . QUEtiapine  50 mg Per Tube QHS  . sodium chloride flush  10-40 mL Intracatheter Q12H  . warfarin  10 mg Per Tube ONCE-1600  . Warfarin - Pharmacist Dosing Inpatient   Does not apply q1600   Continuous Infusions: . anticoagulant sodium citrate    . anticoagulant sodium citrate    . dextrose Stopped (07/03/19 1141)  . dextrose Stopped (07/02/19 1730)     LOS: 75 days   Marylu Lund, MD [facility] Pager On Amion  If 7PM-7AM, please contact night-coverage 07/06/2019, 2:32 PM

## 2019-07-07 ENCOUNTER — Inpatient Hospital Stay (HOSPITAL_COMMUNITY): Payer: No Typology Code available for payment source

## 2019-07-07 DIAGNOSIS — J9601 Acute respiratory failure with hypoxia: Secondary | ICD-10-CM | POA: Diagnosis not present

## 2019-07-07 DIAGNOSIS — I63312 Cerebral infarction due to thrombosis of left middle cerebral artery: Secondary | ICD-10-CM | POA: Diagnosis not present

## 2019-07-07 LAB — CBC WITH DIFFERENTIAL/PLATELET
Abs Immature Granulocytes: 0.02 10*3/uL (ref 0.00–0.07)
Basophils Absolute: 0.1 10*3/uL (ref 0.0–0.1)
Basophils Relative: 1 %
Eosinophils Absolute: 0.2 10*3/uL (ref 0.0–0.5)
Eosinophils Relative: 2 %
HCT: 32.1 % — ABNORMAL LOW (ref 39.0–52.0)
Hemoglobin: 9.5 g/dL — ABNORMAL LOW (ref 13.0–17.0)
Immature Granulocytes: 0 %
Lymphocytes Relative: 17 %
Lymphs Abs: 1.3 10*3/uL (ref 0.7–4.0)
MCH: 29 pg (ref 26.0–34.0)
MCHC: 29.6 g/dL — ABNORMAL LOW (ref 30.0–36.0)
MCV: 97.9 fL (ref 80.0–100.0)
Monocytes Absolute: 0.5 10*3/uL (ref 0.1–1.0)
Monocytes Relative: 6 %
Neutro Abs: 5.6 10*3/uL (ref 1.7–7.7)
Neutrophils Relative %: 74 %
Platelets: 399 10*3/uL (ref 150–400)
RBC: 3.28 MIL/uL — ABNORMAL LOW (ref 4.22–5.81)
RDW: 17.5 % — ABNORMAL HIGH (ref 11.5–15.5)
WBC: 7.6 10*3/uL (ref 4.0–10.5)
nRBC: 0 % (ref 0.0–0.2)

## 2019-07-07 LAB — GLUCOSE, CAPILLARY
Glucose-Capillary: 120 mg/dL — ABNORMAL HIGH (ref 70–99)
Glucose-Capillary: 158 mg/dL — ABNORMAL HIGH (ref 70–99)
Glucose-Capillary: 173 mg/dL — ABNORMAL HIGH (ref 70–99)
Glucose-Capillary: 187 mg/dL — ABNORMAL HIGH (ref 70–99)
Glucose-Capillary: 197 mg/dL — ABNORMAL HIGH (ref 70–99)

## 2019-07-07 LAB — BASIC METABOLIC PANEL
Anion gap: 18 — ABNORMAL HIGH (ref 5–15)
BUN: 90 mg/dL — ABNORMAL HIGH (ref 6–20)
CO2: 26 mmol/L (ref 22–32)
Calcium: 9.8 mg/dL (ref 8.9–10.3)
Chloride: 92 mmol/L — ABNORMAL LOW (ref 98–111)
Creatinine, Ser: 4.74 mg/dL — ABNORMAL HIGH (ref 0.61–1.24)
GFR calc Af Amer: 17 mL/min — ABNORMAL LOW (ref >60)
GFR calc non Af Amer: 15 mL/min — ABNORMAL LOW (ref >60)
Glucose, Bld: 179 mg/dL — ABNORMAL HIGH (ref 70–99)
Potassium: 4 mmol/L (ref 3.5–5.1)
Sodium: 136 mmol/L (ref 135–145)

## 2019-07-07 LAB — PROTIME-INR
INR: 3 — ABNORMAL HIGH (ref 0.8–1.2)
Prothrombin Time: 30.7 seconds — ABNORMAL HIGH (ref 11.4–15.2)

## 2019-07-07 MED ORDER — ALBUMIN HUMAN 25 % IV SOLN
INTRAVENOUS | Status: AC
  Administered 2019-07-07: 25 g
  Filled 2019-07-07: qty 100

## 2019-07-07 NOTE — Plan of Care (Signed)
  Problem: Education: Goal: Knowledge of General Education information will improve Description: Including pain rating scale, medication(s)/side effects and non-pharmacologic comfort measures Outcome: Progressing   Problem: Health Behavior/Discharge Planning: Goal: Ability to manage health-related needs will improve Outcome: Progressing   Problem: Clinical Measurements: Goal: Ability to maintain clinical measurements within normal limits will improve Outcome: Progressing Goal: Will remain free from infection Outcome: Progressing Goal: Diagnostic test results will improve Outcome: Progressing Goal: Respiratory complications will improve Outcome: Progressing Goal: Cardiovascular complication will be avoided Outcome: Progressing   Problem: Activity: Goal: Risk for activity intolerance will decrease Outcome: Progressing   Problem: Nutrition: Goal: Adequate nutrition will be maintained Outcome: Progressing   Problem: Coping: Goal: Level of anxiety will decrease Outcome: Progressing   Problem: Elimination: Goal: Will not experience complications related to bowel motility Outcome: Progressing Goal: Will not experience complications related to urinary retention Outcome: Progressing   Problem: Pain Managment: Goal: General experience of comfort will improve Outcome: Progressing   Problem: Safety: Goal: Ability to remain free from injury will improve Outcome: Progressing   Problem: Skin Integrity: Goal: Risk for impaired skin integrity will decrease Outcome: Progressing   Problem: Education: Goal: Knowledge of disease or condition will improve Outcome: Progressing Goal: Knowledge of secondary prevention will improve Outcome: Progressing Goal: Knowledge of patient specific risk factors addressed and post discharge goals established will improve Outcome: Progressing Goal: Individualized Educational Video(s) Outcome: Progressing   Problem: Coping: Goal: Will verbalize  positive feelings about self Outcome: Progressing Goal: Will identify appropriate support needs Outcome: Progressing   Problem: Health Behavior/Discharge Planning: Goal: Ability to manage health-related needs will improve Outcome: Progressing   Problem: Self-Care: Goal: Ability to participate in self-care as condition permits will improve Outcome: Progressing Goal: Verbalization of feelings and concerns over difficulty with self-care will improve Outcome: Progressing Goal: Ability to communicate needs accurately will improve Description: Patient with communication impairment due to tracheostomy. Has flat affect and wandering facial expression. Intermittently follows commands when asked, and nod appropriately. RN and family will continue to interact with patient.    Outcome: Progressing   Problem: Nutrition: Goal: Risk of aspiration will decrease Outcome: Progressing Goal: Dietary intake will improve Outcome: Progressing   Problem: Ischemic Stroke/TIA Tissue Perfusion: Goal: Complications of ischemic stroke/TIA will be minimized Outcome: Progressing

## 2019-07-07 NOTE — TOC Progression Note (Signed)
Transition of Care (TOC) - Progression Note    Patient Details  Name: Carlos Brewer. MRN: [id number] Date of Birth: Oct 14, 1985  Transition of Care [facility] CM/SW Contact  Pollie Friar, RN Phone Number: 07/07/2019, 3:16 PM  Clinical Narrative:    CM was updated by last CM that the [facility] will look at a transfer to the [facility] since [facility] has been denied by the 2 local facilities and SNF not approved per [place]. CM has updated the patients mother and she does not want the patient transferred to the [facility] at this time. CM has updated Cherly Anderson with the [place] and she asked that we retry [facility]. CM has asked [facility] to re-look at the patient and his progress.  TOC awaiting their decision.   Expected Discharge Plan: Long Term Acute Care (LTAC) Barriers to Discharge: Continued Medical Work up  Expected Discharge Plan and Services Expected Discharge Plan: [place] (LTAC)                                               Social Determinants of Health (SDOH) Interventions    Readmission Risk Interventions No flowsheet data found.

## 2019-07-07 NOTE — Care Management (Signed)
CM gave LTACH referral to both [facility] and [facility] - both declined to offer [facility]. Per [facility] communication - [facility] now be considered for possible transfer to [facility] facility.  CM dicussed limited discharge options with attending and discussed recommendation from [place] to transfer to [place] facility.  Attending plans to assess pt for stability required for transfer - attending to follow up with TOC 07/07/19 and TOC will provide documentation.

## 2019-07-07 NOTE — Progress Notes (Signed)
Admit: 04/21/2019 LOS: 76  34M originally presented with L MCA CVA s/p tPA and thrombectomy; lV Thrombus with sCHF/cardiogenic shock; VDRF req trach; additional CVA  Subjective:   . Seen on HD, 2K bath, 2LUF, TDC. Tol well . No interval events  04/21 0701 - 04/22 0700 In: 2666 [NG/GT:[id number]] Out: 1000 [Urine:500; Stool:500]  Filed Weights   07/03/19 0300 07/07/19 0420 07/07/19 0927  Weight: 94.5 kg 98.8 kg 99.4 kg    Scheduled Meds: . ARIPiprazole  10 mg Per Tube Daily  . aspirin  81 mg Per Tube Daily  . atorvastatin  40 mg Per Tube q1800  . chlorhexidine gluconate (MEDLINE KIT)  15 mL Mouth Rinse BID  . Chlorhexidine Gluconate Cloth  6 each Topical Q0600  . Chlorhexidine Gluconate Cloth  6 each Topical Q0600  . Darbepoetin Alfa  150 mcg Intravenous Q Mon-1800  . feeding supplement (PRO-STAT SUGAR FREE 64)  60 mL Per Tube BID  . FLUoxetine  20 mg Per Tube Daily  . free water  100 mL Per Tube Q4H  . insulin aspart  0-20 Units Subcutaneous Q4H  . insulin detemir  15 Units Subcutaneous BID  . isosorbide-hydrALAZINE  1 tablet Per Tube BID  . mouth rinse  15 mL Mouth Rinse 10 times per day  . midodrine  10 mg Per Tube Q8H  . pantoprazole sodium  40 mg Per Tube Q1200  . QUEtiapine  50 mg Per Tube QHS  . sodium chloride flush  10-40 mL Intracatheter Q12H  . Warfarin - Pharmacist Dosing Inpatient   Does not apply q1600   Continuous Infusions: . anticoagulant sodium citrate    . anticoagulant sodium citrate    . dextrose Stopped (07/03/19 1141)  . dextrose Stopped (07/02/19 1730)  . feeding supplement (VITAL 1.5 CAL) 1,000 mL (07/06/19 1901)   PRN Meds:.acetaminophen, ALPRAZolam, anticoagulant sodium citrate, anticoagulant sodium citrate, bisacodyl, fentaNYL (SUBLIMAZE) injection, lidocaine (PF), lidocaine-prilocaine, lip balm, loperamide HCl, ondansetron (ZOFRAN) IV, pentafluoroprop-tetrafluoroeth, polyethylene glycol, sodium chloride flush, white petrolatum  Current Labs:  reviewed    Physical Exam:  Blood pressure 102/66, pulse (!) 103, temperature (!) 97.5 F (36.4 C), temperature source Oral, resp. rate 16, height 6' 1" (1.854 m), weight 99.4 kg, SpO2 100 %. Awake, tracks L IJ TDC present RRR  CTAB No sig LEE S/nt  A 1. Dialysis dependent AKI, anuric 2. Prolonged VDRF now trach present 3. S/p CVA at admission 4. Chronic sCHF 5. LV Thrombus 6. HIT on warfarin 7. Polymorphic VT, resolved 8. Anemia ESA qTues, Hb stable 9. CKD-BMD; stable #s  P . Cont HD on THS schedule:  2K 4h TDC 400/600, no heparin, UF 2L; citrate lock TDC . Medication Issues; o Preferred narcotic agents for pain control are hydromorphone, fentanyl, and methadone. Morphine should not be used.  o Baclofen should be avoided o Avoid oral sodium phosphate and magnesium citrate based laxatives / bowel preps      MD 07/07/2019, 10:54 AM  Recent Labs  Lab 07/05/19 0325 07/06/19 0302 07/07/19 0634  NA 139 139 136  K 4.6 4.5 4.0  CL 97* 97* 92*  CO2 19* 25 26  GLUCOSE 190* 126* 179*  BUN 116* 55* 90*  CREATININE 6.65* 3.78* 4.74*  CALCIUM 9.2 9.5 9.8   Recent Labs  Lab 07/05/19 0325 07/06/19 0302 07/07/19 0634  WBC 7.3 6.8 7.6  NEUTROABS  --  4.8 5.6  HGB 10.1* 9.7* 9.5*  HCT 33.9* 33.2* 32.1*  MCV 97.7 99.4 97.9    PLT 358 364 399             

## 2019-07-07 NOTE — Progress Notes (Signed)
  Speech Language Pathology  Patient Details Name: Carlos Brewer. MRN: [id number] DOB: 1985-09-18 Today's Date: 07/07/2019 Time:  -     MBS is scheduled for today at 10:00. PLEASE MAKE SURE PASSY-MUIR SPEAKING VALVE COMES DOWN WITH PT.               Houston Siren 07/07/2019, 7:54 AM   [name].Ed Risk analyst [phone] Office [phone]

## 2019-07-07 NOTE — Progress Notes (Signed)
SLP Cancellation Note  Patient Details Name: Carlos Brewer. MRN: [id number] DOB: Mar 05, 1986   Cancelled treatment:        Therapist received word from radiology technician that pt was in HD this morning and unable to come for MBS. Will schedule for tomorrow.   Carlos Brewer 07/07/2019, 11:46 AM   Carlos Brewer.Ed Risk analyst [phone] Office [phone]

## 2019-07-07 NOTE — Progress Notes (Signed)
PROGRESS NOTE    Carlos Brewer.  SJG:[id number] DOB: 09-19-1985 DOA: 04/21/2019 PCP: Patient, No Pcp Per    Brief Narrative:  34 yo male smoker found to have slurred speech and Rt sided weakness. Admitted 2/5 with left MCA CVA and multiple smaller embolic infarcts>>>tPA and thrombectomy in IR. Additionally found to have left ventricular thrombus. Course complicated by septic shock r/t HCAP, AKI requiring CRRT, polymorphic VT and cardiogenic shock. Intermittent pressor dependence, required tracheostomy, intermittent hemodialysis, pontine CVA, Heart failure with reduced ejection fraction < 15% on last echocardiogram, Recurrent episodes of bilateral infiltrates and mechanical ventilation, ??Aspiration pneumonitis.  Patient has been somewhat stabilized in the ICU.  [facility] hospital[facility] to resume care on 07/03/2019  Assessment & Plan:   Active Problems:   Occlusion of left middle cerebral artery   Middle cerebral artery embolism, left   Cerebrovascular accident (CVA) due to thrombosis of left middle cerebral artery (HCC)   Shock ([place])   Shock liver   Respiratory failure (HCC)   Pressure injury of skin   Tracheostomy tube present (HCC)   Acute CVA (cerebrovascular accident) ([place])   Acute kidney injury ([place])   Endotracheal tube present   Complication of tracheostomy tube ([place])   Acute hypoxemic respiratory failure (HCC)   Fever   Type 2 diabetes mellitus with other specified complication (HCC)   Hypoxia   HFrEF (heart failure with reduced ejection fraction) ([place])   Pneumothorax   Pneumomediastinum (HCC)   Hemoptysis   Palliative care encounter   Acute renal failure ([place])   Acute on chronic respiratory failure Tracheostomy with intermittent bleeding Stable Currently continued on trach collar, stable at 28% FiO2 Continue Pulmonary hygiene PCCM following for trach/vent needs Per nursing staff, only small to moderate amounts of sputum suctioned out On exam, lungs clear this  AM  End-stage cardiomyopathy/chronic systolic HF/polymorphic VT/LV thrombus Followed by cardiology, signed off on 4/3 as patient is not a candidate for advanced heart failure therapies Continue hydralazine, midodrine, Coumadin with pharmacy to dose INR currently 3.0 Cont on tele  End-stage renal failure Multifactorial including ATN following cardiogenic/septic shock Anuric without evidence of renal recovery thus far Status post CRRT, stopped on 4/15 Currently tolerating HD per Nephrology  Diabetes mellitus type 2 SSI, Lantus, Accu-Cheks, hypoglycemic protocol Glycemic trends remain stable  HIT Continue Angiomax For the management by pharmacy  CVA on admission Continue with ASA, Lipitor as tolerated  Radiopaque density in abdomen Suspicion for ingested foreign body Serial KUBs with density perhaps in descending colon Will repeat abd xray in AM, most recent was on 4/18  ICU delirium/intermittent agitation Noted to keep pulling out NG tube, PEG tube PEG tube replaced by IR on 07/04/2019 Continue Seroquel as tolerated One-to-one sitter, restraints continued for patient safety  Nutrition/dysphagia PEG tube replaced by IR on 07/04/2019 Continue with tube feeds as tolerated  Goals of care Very poor prognosis Palliative care on board-full scope of treatment   DVT prophylaxis: SCD's Code Status: Full Family Communication: Pt in room, family not at bedside  Status is: Inpatient  Remains inpatient appropriate because:Altered mental status and IV treatments appropriate due to intensity of illness or inability to take PO   Dispo: The patient is from: Home              Anticipated d/c is to: Complicated disposition currently being worked on by [facility], possible LTAC              Anticipated d/c date is: > 3 days  Patient currently is not medically stable to d/c.        Consultants:    PCCM  Cardiology/EP  Nephrology  Neurology  IR  Palliative  Procedures:  2/5-2/6:  TPA given at 428 am (total of 90 Mg) 520 am went to IR  S/P Lt common carotid arteriogram followed by complete revascularization of occluded LT MCA sup division mid M2 seg with x 1 pass with 9mx 40 mm solitaire X ret river device and penumbra aspiration with TICI3revascularization  ETT 2/05 >> 2/18 LIJ CVL 2/5 >>2/18  RT [place] CVL 2/18 >>3/2 RIJ HD cath 2/6 >>2/18 6 shiley cuffed trach 2/18 >>3/4  RT fem HD 2/19 >>3/2 RIJ permacath 3/2>> 3/4 shiley 4 uncuffed.... dislodged placed back 6cuffed shiley that evening 3/5: change to #6 cuffed distal XLT  3/17 right femoral CVL >> 3/23 3/17 right femoral arterial line >>3/23 3/31 bronchoscopy : airway clear, conclusion = end of trach up against the wall obstructing airflow   3/31 trach change to cuffed #6 XLT  4/7 PEG by IR  Antimicrobials: Anti-infectives (From admission, onward)   Start     Dose/Rate Route Frequency Ordered Stop   06/28/19 1100  vancomycin (VANCOREADY) IVPB 1250 mg/250 mL  Status:  Discontinued     1,250 mg 166.7 mL/hr over 90 Minutes Intravenous Every 24 hours 06/27/19 0925 06/29/19 1011   06/27/19 1400  meropenem (MERREM) 1 g in sodium chloride 0.9 % 100 mL IVPB  Status:  Discontinued     1 g 200 mL/hr over 30 Minutes Intravenous Every 8 hours 06/27/19 0950 06/27/19 0952   06/27/19 1045  meropenem (MERREM) 1 g in sodium chloride 0.9 % 100 mL IVPB  Status:  Discontinued     1 g 200 mL/hr over 30 Minutes Intravenous Every 8 hours 06/27/19 0952 06/29/19 1011   06/27/19 1015  meropenem (MERREM) 1 g in sodium chloride 0.9 % 100 mL IVPB  Status:  Discontinued     1 g 200 mL/hr over 30 Minutes Intravenous Every 24 hours 06/27/19 0925 06/27/19 0950   06/27/19 1015  vancomycin (VANCOREADY) IVPB 2000 mg/400 mL     2,000 mg 200 mL/hr over 120 Minutes Intravenous  Once 06/27/19 0925 06/27/19 1246   06/23/19 1554   ceFAZolin (ANCEF) IVPB 2g/100 mL premix     over 30 Minutes  Continuous PRN 06/23/19 1554 06/23/19 1554   06/23/19 1544  ceFAZolin (ANCEF) 2-4 GM/100ML-% IVPB    Note to Pharmacy: PTamsen Snider  : cabinet override      06/23/19 1544 06/24/19 0359   06/18/19 1130  Ampicillin-Sulbactam (UNASYN) 3 g in sodium chloride 0.9 % 100 mL IVPB     3 g 200 mL/hr over 30 Minutes Intravenous Every 8 hours 06/18/19 1039 06/19/19 2119   06/16/19 1400  ceFEPIme (MAXIPIME) 2 g in sodium chloride 0.9 % 100 mL IVPB  Status:  Discontinued     2 g 200 mL/hr over 30 Minutes Intravenous Every 12 hours 06/16/19 1216 06/18/19 1010   06/16/19 1400  vancomycin (VANCOREADY) IVPB 1250 mg/250 mL  Status:  Discontinued     1,250 mg 166.7 mL/hr over 90 Minutes Intravenous Every 24 hours 06/16/19 1216 06/18/19 1010   06/16/19 1200  vancomycin (VANCOCIN) IVPB 1000 mg/200 mL premix  Status:  Discontinued     1,000 mg 200 mL/hr over 60 Minutes Intravenous Every T-Th-Sa (Hemodialysis) 06/15/19 0459 06/16/19 1216   06/15/19 2200  ceFEPIme (MAXIPIME) 1 g in sodium chloride 0.9 %  100 mL IVPB  Status:  Discontinued     1 g 200 mL/hr over 30 Minutes Intravenous Every 24 hours 06/15/19 0459 06/16/19 1216   06/15/19 0500  vancomycin (VANCOREADY) IVPB 2000 mg/400 mL     2,000 mg 200 mL/hr over 120 Minutes Intravenous  Once 06/15/19 0459 06/15/19 0802   06/15/19 0500  ceFEPIme (MAXIPIME) 2 g in sodium chloride 0.9 % 100 mL IVPB     2 g 200 mL/hr over 30 Minutes Intravenous  Once 06/15/19 0459 06/15/19 0941   06/04/19 2200  ceFEPIme (MAXIPIME) 1 g in sodium chloride 0.9 % 100 mL IVPB    Note to Pharmacy: Pharmacy may modify dose as appropriate for renal failure   1 g 200 mL/hr over 30 Minutes Intravenous Daily at bedtime 06/04/19 1347 06/06/19 2303   06/02/19 1200  anidulafungin (ERAXIS) 100 mg in sodium chloride 0.9 % 100 mL IVPB  Status:  Discontinued     100 mg 78 mL/hr over 100 Minutes Intravenous Every 24 hours 06/01/19 1049  06/04/19 1053   06/01/19 2200  ceFEPIme (MAXIPIME) 1 g in sodium chloride 0.9 % 100 mL IVPB  Status:  Discontinued    Note to Pharmacy: Pharmacy may modify dose as appropriate for renal failure   1 g 200 mL/hr over 30 Minutes Intravenous Daily at bedtime 06/01/19 1054 06/04/19 1053   06/01/19 1300  vancomycin (VANCOCIN) IVPB 1000 mg/200 mL premix     1,000 mg 200 mL/hr over 60 Minutes Intravenous  Once 06/01/19 1203 06/01/19 1401   06/01/19 1200  vancomycin (VANCOCIN) IVPB 1000 mg/200 mL premix  Status:  Discontinued     1,000 mg 200 mL/hr over 60 Minutes Intravenous Every M-W-F (Hemodialysis) 06/01/19 0428 06/04/19 1053   06/01/19 1100  anidulafungin (ERAXIS) 200 mg in sodium chloride 0.9 % 200 mL IVPB     200 mg 78 mL/hr over 200 Minutes Intravenous  Once 06/01/19 1049 06/01/19 2002   06/01/19 1045  anidulafungin (ERAXIS) 100 mg in sodium chloride 0.9 % 100 mL IVPB  Status:  Discontinued     100 mg 78 mL/hr over 100 Minutes Intravenous Every 24 hours 06/01/19 1038 06/01/19 1048   06/01/19 0300  ceFEPIme (MAXIPIME) 1,000 mg in sodium chloride 0.9 % 100 mL IVPB  Status:  Discontinued    Note to Pharmacy: Pharmacy may modify dose as appropriate for renal failure   1,000 mg 200 mL/hr over 30 Minutes Intravenous Daily at bedtime 06/01/19 0250 06/01/19 1054   06/01/19 0300  vancomycin (VANCOREADY) IVPB 2000 mg/400 mL     2,000 mg 200 mL/hr over 120 Minutes Intravenous  Once 06/01/19 0252 06/01/19 0843   05/27/19 1000  cefTRIAXone (ROCEPHIN) 2 g in sodium chloride 0.9 % 100 mL IVPB     2 g 200 mL/hr over 30 Minutes Intravenous Every 24 hours 05/27/19 0946 05/31/19 0952   05/17/19 0000  ceFAZolin (ANCEF) IVPB 2g/100 mL premix     2 g 200 mL/hr over 30 Minutes Intravenous To Radiology 05/16/19 1431 05/17/19 1556   05/09/19 1200  piperacillin-tazobactam (ZOSYN) IVPB 3.375 g     3.375 g 100 mL/hr over 30 Minutes Intravenous Every 6 hours 05/09/19 1029 05/10/19 1830   05/08/19 1800   piperacillin-tazobactam (ZOSYN) IVPB 3.375 g  Status:  Discontinued     3.375 g 12.5 mL/hr over 240 Minutes Intravenous Every 6 hours 05/08/19 1318 05/09/19 1029   05/07/19 1100  vancomycin (VANCOCIN) IVPB 1000 mg/200 mL premix  Status:  Discontinued  1,000 mg 200 mL/hr over 60 Minutes Intravenous Every 24 hours 05/06/19 0949 05/09/19 1019   05/06/19 1030  piperacillin-tazobactam (ZOSYN) IVPB 3.375 g  Status:  Discontinued     3.375 g 12.5 mL/hr over 240 Minutes Intravenous Every 6 hours 05/06/19 0949 05/08/19 1318   05/06/19 1000  vancomycin (VANCOREADY) IVPB 2000 mg/400 mL     2,000 mg 200 mL/hr over 120 Minutes Intravenous  Once 05/06/19 0949 05/06/19 1240   04/24/19 0600  meropenem (MERREM) 1 g in sodium chloride 0.9 % 100 mL IVPB     1 g 200 mL/hr over 30 Minutes Intravenous Every 8 hours 04/23/19 2024 04/29/19 2138   04/23/19 2000  meropenem (MERREM) 2 g in sodium chloride 0.9 % 100 mL IVPB     2 g 200 mL/hr over 30 Minutes Intravenous Every 8 hours 04/23/19 1956 04/23/19 2057   04/23/19 1000  vancomycin (VANCOREADY) IVPB 1250 mg/250 mL  Status:  Discontinued     1,250 mg 166.7 mL/hr over 90 Minutes Intravenous Every 24 hours 04/23/19 0907 04/26/19 1003   04/23/19 1000  piperacillin-tazobactam (ZOSYN) IVPB 3.375 g  Status:  Discontinued     3.375 g 12.5 mL/hr over 240 Minutes Intravenous Every 8 hours 04/23/19 0907 04/23/19 1955   04/18/2019 0549  ceFAZolin (ANCEF) 2-4 GM/100ML-% IVPB    Note to Pharmacy: Fredric Dine   : cabinet override      04/18/2019 0549 05/02/2019 1759      Subjective: Unable to assess as pt is non verbal. Seems to nod appropriately and smile in response to questions  Objective: Vitals:   07/07/19 1330 07/07/19 1344 07/07/19 1534 07/07/19 1537  BP: 95/64 (!) 97/58  97/78  Pulse: 95 95  91  Resp:  16 18 18   Temp:  97.7 F (36.5 C)  (!) 97.3 F (36.3 C)  TempSrc:  Oral  Oral  SpO2:  100% 100% 100%  Weight:  98.7 kg    Height:         Intake/Output Summary (Last 24 hours) at 07/07/2019 1727 Last data filed at 07/07/2019 1500 Gross per 24 hour  Intake 2415 ml  Output 1494 ml  Net 921 ml   Filed Weights   07/07/19 0420 07/07/19 0927 07/07/19 1344  Weight: 98.8 kg 99.4 kg 98.7 kg    Examination: General exam: Awake, laying in bed, in nad, in HD Respiratory system: Normal respiratory effort, no wheezing Cardiovascular system: regular rate, s1, s2 Gastrointestinal system: Soft, nondistended, positive BS Central nervous system: CN2-12 grossly intact, strength intact Extremities: Perfused, no clubbing Skin: Normal skin turgor, no notable skin lesions seen Psychiatry: Unable to assess as pt not verbal  Data Reviewed: I have personally reviewed following labs and imaging studies  CBC: Recent Labs  Lab 07/03/19 0313 07/04/19 0313 07/05/19 0325 07/06/19 0302 07/07/19 0634  WBC 10.3 8.4 7.3 6.8 7.6  NEUTROABS  --   --   --  4.8 5.6  HGB 11.4* 9.9* 10.1* 9.7* 9.5*  HCT 39.0 33.0* 33.9* 33.2* 32.1*  MCV 101.3* 98.5 97.7 99.4 97.9  PLT 379 389 358 364 235   Basic Metabolic Panel: Recent Labs  Lab 07/01/19 0630 07/01/19 0630 07/02/19 0342 07/02/19 0342 07/03/19 0313 07/04/19 0313 07/05/19 0325 07/06/19 0302 07/07/19 0634  NA 139   < > 137   < > 135 136 139 139 136  K 3.8   < > 3.8   < > 4.1 3.9 4.6 4.5 4.0  CL 97*   < >  96*   < > 94* 95* 97* 97* 92*  CO2 20*   < > 20*   < > 23 21* 19* 25 26  GLUCOSE 78   < > 138*   < > 119* 195* 190* 126* 179*  BUN 72*   < > 104*   < > 38* 78* 116* 55* 90*  CREATININE 3.37*   < > 4.74*   < > 3.79* 6.20* 6.65* 3.78* 4.74*  CALCIUM 10.4*   < > 9.9   < > 10.0 9.5 9.2 9.5 9.8  MG 3.1*  --  3.3*  --   --   --   --   --   --    < > = values in this interval not displayed.   GFR: Estimated Creatinine Clearance: 26.9 mL/min (A) (by C-G formula based on SCr of 4.74 mg/dL (H)). Liver Function Tests: No results for input(s): AST, ALT, ALKPHOS, BILITOT, PROT, ALBUMIN in  the last 168 hours. No results for input(s): LIPASE, AMYLASE in the last 168 hours. No results for input(s): AMMONIA in the last 168 hours. Coagulation Profile: Recent Labs  Lab 07/04/19 0313 07/04/19 1427 07/05/19 0325 07/06/19 0302 07/07/19 0634  INR 3.5* 2.5* 2.1* 2.2* 3.0*   Cardiac Enzymes: No results for input(s): CKTOTAL, CKMB, CKMBINDEX, TROPONINI in the last 168 hours. BNP (last 3 results) No results for input(s): PROBNP in the last 8760 hours. HbA1C: No results for input(s): HGBA1C in the last 72 hours. CBG: Recent Labs  Lab 07/06/19 2012 07/07/19 0000 07/07/19 0418 07/07/19 0754 07/07/19 1541  GLUCAP 144* 158* 173* 187* 120*   Lipid Profile: No results for input(s): CHOL, HDL, LDLCALC, TRIG, CHOLHDL, LDLDIRECT in the last 72 hours. Thyroid Function Tests: No results for input(s): TSH, T4TOTAL, FREET4, T3FREE, THYROIDAB in the last 72 hours. Anemia Panel: No results for input(s): VITAMINB12, FOLATE, FERRITIN, TIBC, IRON, RETICCTPCT in the last 72 hours. Sepsis Labs: No results for input(s): PROCALCITON, LATICACIDVEN in the last 168 hours.  No results found for this or any previous visit (from the past 240 hour(s)).   Radiology Studies: No results found.  Scheduled Meds: . ARIPiprazole  10 mg Per Tube Daily  . aspirin  81 mg Per Tube Daily  . atorvastatin  40 mg Per Tube q1800  . chlorhexidine gluconate (MEDLINE KIT)  15 mL Mouth Rinse BID  . Chlorhexidine Gluconate Cloth  6 each Topical Q0600  . Chlorhexidine Gluconate Cloth  6 each Topical Q0600  . Darbepoetin Alfa  150 mcg Intravenous Q Mon-1800  . feeding supplement (PRO-STAT SUGAR FREE 64)  60 mL Per Tube BID  . FLUoxetine  20 mg Per Tube Daily  . free water  100 mL Per Tube Q4H  . insulin aspart  0-20 Units Subcutaneous Q4H  . insulin detemir  15 Units Subcutaneous BID  . isosorbide-hydrALAZINE  1 tablet Per Tube BID  . mouth rinse  15 mL Mouth Rinse 10 times per day  . midodrine  10 mg Per  Tube Q8H  . pantoprazole sodium  40 mg Per Tube Q1200  . QUEtiapine  50 mg Per Tube QHS  . sodium chloride flush  10-40 mL Intracatheter Q12H  . Warfarin - Pharmacist Dosing Inpatient   Does not apply q1600   Continuous Infusions: . anticoagulant sodium citrate    . dextrose Stopped (07/03/19 1141)  . dextrose Stopped (07/02/19 1730)  . feeding supplement (VITAL 1.5 CAL) 1,000 mL (07/06/19 1901)     LOS: 76  days   Marylu Lund, MD [facility] Pager On [facility]  If 7PM-7AM, please contact night-coverage 07/07/2019, 5:27 PM

## 2019-07-07 NOTE — Progress Notes (Signed)
ANTICOAGULATION CONSULT NOTE - Follow Up Consult  Pharmacy Consult for Coumadin Indication: LV apical thrombus + CVA,  Allergies  Allergen Reactions  . Heparin Other (See Comments)    Heparin induced thrombocytopenia. 2/15 HIT OD 1.692. 2/16 SRA positive-90.     Patient Measurements: Height: 6\' 1"  (185.4 cm) Weight: 98.8 kg (217 lb 13 oz) IBW/kg (Calculated) : 79.9   Vital Signs: Temp: 98 F (36.7 C) (04/22 0738) Temp Source: Oral (04/22 0738) BP: 105/75 (04/22 0738) Pulse Rate: 90 (04/22 0738)  Labs: Recent Labs    07/04/19 1427 07/04/19 1427 07/05/19 0325 07/05/19 0325 07/06/19 0302 07/07/19 0634  HGB  --   --  10.1*   < > 9.7* 9.5*  HCT  --   --  33.9*  --  33.2* 32.1*  PLT  --   --  358  --  364 399  APTT 45*  --  26  --   --   --   LABPROT 26.9*   < > 23.4*  --  24.7* 30.7*  INR 2.5*   < > 2.1*  --  2.2* 3.0*  CREATININE  --   --  6.65*  --  3.78* 4.74*   < > = values in this interval not displayed.    Estimated Creatinine Clearance: 26.9 mL/min (A) (by C-G formula based on SCr of 4.74 mg/dL (H)).  Assessment: Anticoag: Small LV apical thrombus + CVA, +HIT - on warfarin. *SRA positive (90) - Heparin allergy on chart. Bivalrudin restarted on 4/15  - Goal aPTT 50-85 seconds  4/19: Bival stopped @ 1735 4/19: did not receive Coumadin dose 4/22: INR 2.2>3 (after larger 12.5mg  dose 4/20)  Goal of Therapy:  INR 2-3 Monitor platelets by anticoagulation protocol: Yes   Plan:  - Hold Coumadin today. - Daily INR, CBC Q72H   Zayana Salvador S. Alford Highland, PharmD, BCPS Clinical Staff Pharmacist [email or web address] Alford Highland, Cadan Maggart Stillinger 07/07/2019,8:04 AM

## 2019-07-08 ENCOUNTER — Inpatient Hospital Stay (HOSPITAL_COMMUNITY): Payer: No Typology Code available for payment source

## 2019-07-08 DIAGNOSIS — J9500 Unspecified tracheostomy complication: Secondary | ICD-10-CM | POA: Diagnosis not present

## 2019-07-08 DIAGNOSIS — J9601 Acute respiratory failure with hypoxia: Secondary | ICD-10-CM | POA: Diagnosis not present

## 2019-07-08 DIAGNOSIS — I69391 Dysphagia following cerebral infarction: Secondary | ICD-10-CM | POA: Diagnosis not present

## 2019-07-08 DIAGNOSIS — Z930 Tracheostomy status: Secondary | ICD-10-CM | POA: Diagnosis not present

## 2019-07-08 LAB — CBC WITH DIFFERENTIAL/PLATELET
Abs Immature Granulocytes: 0.03 10*3/uL (ref 0.00–0.07)
Basophils Absolute: 0.1 10*3/uL (ref 0.0–0.1)
Basophils Relative: 1 %
Eosinophils Absolute: 0.1 10*3/uL (ref 0.0–0.5)
Eosinophils Relative: 1 %
HCT: 30.4 % — ABNORMAL LOW (ref 39.0–52.0)
Hemoglobin: 8.8 g/dL — ABNORMAL LOW (ref 13.0–17.0)
Immature Granulocytes: 0 %
Lymphocytes Relative: 13 %
Lymphs Abs: 1 10*3/uL (ref 0.7–4.0)
MCH: 28 pg (ref 26.0–34.0)
MCHC: 28.9 g/dL — ABNORMAL LOW (ref 30.0–36.0)
MCV: 96.8 fL (ref 80.0–100.0)
Monocytes Absolute: 0.5 10*3/uL (ref 0.1–1.0)
Monocytes Relative: 7 %
Neutro Abs: 6.3 10*3/uL (ref 1.7–7.7)
Neutrophils Relative %: 78 %
Platelets: 358 10*3/uL (ref 150–400)
RBC: 3.14 MIL/uL — ABNORMAL LOW (ref 4.22–5.81)
RDW: 17.2 % — ABNORMAL HIGH (ref 11.5–15.5)
WBC: 8.1 10*3/uL (ref 4.0–10.5)
nRBC: 0 % (ref 0.0–0.2)

## 2019-07-08 LAB — GLUCOSE, CAPILLARY
Glucose-Capillary: 137 mg/dL — ABNORMAL HIGH (ref 70–99)
Glucose-Capillary: 159 mg/dL — ABNORMAL HIGH (ref 70–99)
Glucose-Capillary: 180 mg/dL — ABNORMAL HIGH (ref 70–99)
Glucose-Capillary: 188 mg/dL — ABNORMAL HIGH (ref 70–99)
Glucose-Capillary: 199 mg/dL — ABNORMAL HIGH (ref 70–99)
Glucose-Capillary: 205 mg/dL — ABNORMAL HIGH (ref 70–99)

## 2019-07-08 LAB — BASIC METABOLIC PANEL
Anion gap: 13 (ref 5–15)
BUN: 39 mg/dL — ABNORMAL HIGH (ref 6–20)
CO2: 25 mmol/L (ref 22–32)
Calcium: 9.5 mg/dL (ref 8.9–10.3)
Chloride: 97 mmol/L — ABNORMAL LOW (ref 98–111)
Creatinine, Ser: 3.12 mg/dL — ABNORMAL HIGH (ref 0.61–1.24)
GFR calc Af Amer: 28 mL/min — ABNORMAL LOW (ref >60)
GFR calc non Af Amer: 25 mL/min — ABNORMAL LOW (ref >60)
Glucose, Bld: 201 mg/dL — ABNORMAL HIGH (ref 70–99)
Potassium: 4 mmol/L (ref 3.5–5.1)
Sodium: 135 mmol/L (ref 135–145)

## 2019-07-08 LAB — PROTIME-INR
INR: 4 — ABNORMAL HIGH (ref 0.8–1.2)
Prothrombin Time: 39 seconds — ABNORMAL HIGH (ref 11.4–15.2)

## 2019-07-08 MED ORDER — RESOURCE THICKENUP CLEAR PO POWD
Freq: Once | ORAL | Status: DC
  Filled 2019-07-08: qty 125

## 2019-07-08 NOTE — Progress Notes (Signed)
MD notified about patient MEWS score. Nurse reassessed the vital signs again. BP was 80/ 64 . MD notified again. Awaiting response.

## 2019-07-08 NOTE — Progress Notes (Signed)
Modified Barium Swallow Progress Note  Patient Details  Name: Carlos Brewer. MRN: [id number] Date of Birth: 1985/08/19  Today's Date: 07/08/2019  Modified Barium Swallow completed.  Full report located under Chart Review in the Imaging Section.  Brief recommendations include the following:  Clinical Impression  Pt demonstrated a mild pharyngeal dysphagia with intermittent trace aspiration of thin liquids.  Aspiration occurred before the onset of the pharyngeal swallow, with liquids spilling into the laryngeal vestibule before it could be closed.  Aspiration elicited a consistent cough response. (PMV was in place for procedure).  Nectar-thick liquids transited through the pharynx without incident (no aspiration).  Purees and mechanical solids were masticated with mild diffuse residue remaining in the oral cavity after the swallow; they were propelled through pharynx with good clearance, no residue post-swallow.  Recommend resuming an oral diet of dysphagia 3; nectar-thick liquids for now.  SLP to focus on improving timing of swallow with thin liquids; diet can be upgraded per clinical judgment since aspiration is accompanied by a cough response.  D/W RN.  Will follow.   Swallow Evaluation Recommendations       SLP Diet Recommendations: Dysphagia 3 (Mech soft) solids;Nectar thick liquid; MUST USE PMV FOR ALL PO INTAKE   Liquid Administration via: Cup;Straw   Medication Administration: Whole meds with puree   Supervision: Staff to assist with self feeding   Compensations: Small sips/bites       Oral Care Recommendations: Oral care BID   Other Recommendations: Order thickener from pharmacy;Place PMSV during PO intake  Carlos Brewer, [place] Office number [phone] Pager [phone]   Carlos Brewer 07/08/2019,10:46 AM

## 2019-07-08 NOTE — Progress Notes (Signed)
Admit: 05/10/2019 LOS: 79  22M originally presented with L MCA CVA s/p tPA and thrombectomy; lV Thrombus with sCHF/cardiogenic shock; VDRF req trach; additional CVA  Subjective:   . Tol HD yesterday, 0.5L UOP, Making some urine? Marland Kitchen No interval events  04/22 0701 - 04/23 0700 In: 1134 [NG/GT:714] Out: 494   Filed Weights   07/07/19 0420 07/07/19 0927 07/07/19 1344  Weight: 98.8 kg 99.4 kg 98.7 kg    Scheduled Meds: . ARIPiprazole  10 mg Per Tube Daily  . aspirin  81 mg Per Tube Daily  . atorvastatin  40 mg Per Tube q1800  . chlorhexidine gluconate (MEDLINE KIT)  15 mL Mouth Rinse BID  . Chlorhexidine Gluconate Cloth  6 each Topical Q0600  . Chlorhexidine Gluconate Cloth  6 each Topical Q0600  . Darbepoetin Alfa  150 mcg Intravenous Q Mon-1800  . feeding supplement (PRO-STAT SUGAR FREE 64)  60 mL Per Tube BID  . FLUoxetine  20 mg Per Tube Daily  . free water  100 mL Per Tube Q4H  . insulin aspart  0-20 Units Subcutaneous Q4H  . insulin detemir  15 Units Subcutaneous BID  . isosorbide-hydrALAZINE  1 tablet Per Tube BID  . mouth rinse  15 mL Mouth Rinse 10 times per day  . midodrine  10 mg Per Tube Q8H  . pantoprazole sodium  40 mg Per Tube Q1200  . QUEtiapine  50 mg Per Tube QHS  . Resource ThickenUp Clear   Oral Once  . sodium chloride flush  10-40 mL Intracatheter Q12H  . Warfarin - Pharmacist Dosing Inpatient   Does not apply q1600   Continuous Infusions: . anticoagulant sodium citrate    . dextrose Stopped (07/03/19 1141)  . dextrose Stopped (07/02/19 1730)  . feeding supplement (VITAL 1.5 CAL) 1,000 mL (07/06/19 1901)   PRN Meds:.acetaminophen, ALPRAZolam, anticoagulant sodium citrate, bisacodyl, fentaNYL (SUBLIMAZE) injection, lip balm, loperamide HCl, ondansetron (ZOFRAN) IV, polyethylene glycol, sodium chloride flush, white petrolatum  Current Labs: reviewed    Physical Exam:  Blood pressure 94/77, pulse (!) 103, temperature 97.9 F (36.6 C), temperature source  Oral, resp. rate 14, height '6\' 1"'$  (1.854 m), weight 98.7 kg, SpO2 91 %. Awake, tracks L IJ TDC present RRR  CTAB No sig LEE S/nt  A 1. Dialysis dependent AKI  2. Prolonged VDRF now trach present 3. S/p CVA at admission 4. Chronic sCHF 5. LV Thrombus 6. HIT on warfarin 7. Polymorphic VT, resolved 8. Anemia ESA qTues, Hb stable 9. CKD-BMD; stable #s  P . Cont HD on THS schedule:  2K 4h TDC 400/600, no heparin, UF 2L BP permitting; citrate lock TDC . PTH, Fe panel with HD tomorrow . Medication Issues; o Preferred narcotic agents for pain control are hydromorphone, fentanyl, and methadone. Morphine should not be used.  o Baclofen should be avoided o Avoid oral sodium phosphate and magnesium citrate based laxatives / bowel preps    [name] MD 07/08/2019, 11:49 AM  Recent Labs  Lab 07/06/19 0302 07/07/19 0634 07/08/19 0458  NA 139 136 135  K 4.5 4.0 4.0  CL 97* 92* 97*  CO2 '25 26 25  '$ GLUCOSE 126* 179* 201*  BUN 55* 90* 39*  CREATININE 3.78* 4.74* 3.12*  CALCIUM 9.5 9.8 9.5   Recent Labs  Lab 07/06/19 0302 07/07/19 0634 07/08/19 0458  WBC 6.8 7.6 8.1  NEUTROABS 4.8 5.6 6.3  HGB 9.7* 9.5* 8.8*  HCT 33.2* 32.1* 30.4*  MCV 99.4 97.9 96.8  PLT  364 399 358             

## 2019-07-08 NOTE — Progress Notes (Signed)
NAME:  Carlos Brewer., MRN:  [id number], DOB:  [birth year], LOS: 3 ADMISSION DATE:  04/26/2019, CONSULTATION DATE:  05/01/2019 REFERRING MD:  Dr. Leonel Ramsay, CHIEF COMPLAINT:  Slurred speech  Brief History   34 yo male smoker found to have slurred speech and Rt sided weakness.  Admitted 2/5 with left MCA CVA and multiple smaller embolic infarcts>>>  tPA and thrombectomy in IR.  Additionally found to have left ventricular  thrombus.     Course complicated by septic shock r/t HCAP, AKI requiring CRRT, polymorphic VT and cardiogenic shock.  Intermittent pressor dependence, required tracheostomy Intermittent hemodialysis [place] CVA Heart failure with reduced ejection fraction < 15% on last echocardiogram Recurrent episodes of bilateral infiltrates and mechanical ventilation?  Aspiration pneumonitis  Past Medical History  Systolic CHF with non ischemic CM, Cocaine abuse, OSA, DM Hx of CHF (EF 15%)  Hx myocarditis Smoker 1/2 ppd   Significant Hospital Events   2/05 Admit, tPA, IR thrombectomy 2/6 Shock requiring increasing levophed and phenylephrine.   2/6: switched to levophed, epi, started antibiotics, started on CRRT.  2/9  developed wide-complex tachycardia and hypotension, started on amiodarone drip, increase Levophed drip 2/15 drop in platelets, heparin stopped, bival started 2/17 Hypotensive and back on Levophed 2/18 trach, lines changed 2/19 started milrinone , fever 103 2/20 Episode of wide-complex tachycardia yesterday, changed from Levophed to vasopressin 2/22 stopping vanc. Still on inotrope support. Some AF w/ RVR. Had to be placed back on pressors. ivabradine added 2/23 still on pressors/ CRRT continued 2/24 tmax 98.4, Remains on CRRT, even UF, remains anuric, Levophed at 14 mcg/min, Milrinone at 0.125 mcg/kg/min, Coox 63.4 Doing well this morning on ATC, no events overnight.  Midodrine added   2/25 more interactive, remains on ATC, tmax 99.5/ WBC 21.4, ~900 ml  liquid stool/ 24 hours, neg Cdiff, Levophed up to 20 mcg/min, CVP 2, ,milrinone remains at 0.125 mcg/kg/min, coox 92.7, Off CRRT since last night ~2000 s/p clotted x 3 off citrate, restarted  3/1 Amio drip restarted for WCT/atrial flutter, milrinone turned off 3/2 CRRT stopped, permacath placed 3/5 crrt resumed 3/6 started on pressors for crrt tolerance 3/8 No longer on CRRT or pressor support  3/12 Tolerated PS for >2 hours  3/17  febrile 106 overnight, shock on pressors, new lines placed, broad-spectrum antibiotics 3/22: Awake, appears comfortable on pressure support ventilation. weaning Precedex, he is back on low-dose norepinephrine 3/23 pm back on vent at hs for resp distress / desats ? chf on cxr  3/24 t collar 24/7 3/29 tolerating ATM without difficulty 3/31 tr back to ICU -trach changed to 6 cuffless, bronched, new fever, started on antibiotics, pneumomediastinum small biapical pneumothoraces-stable 4/1 CRRT transient 4/5 started back on HD tiw, pneumothorax, pneumomediastinum resolved. Active tracheal bleeding> Bival held 4/6 reduced amiodarone to 200 mg daily, bival resumed 4/7 Off levo, tolerating trach collar.  4/9 transfer out of ICU 4/10 desaturations, too "unastable" for HD from respiratory standpoint 4/11 send back to ICU for CRRT 4/13 Intermittent junctional bradycardia noted >> amio stopped  Consults:  Neuro IR Cardiology  Nephrology Heart Failure  EP   Procedures:  2/5-2/6:  TPA given at 428 am (total of 90 Mg) 520 am went to IR  S/P Lt common carotid arteriogram followed by complete revascularization of occluded LT MCA sup division mid M2 seg with x 1 pass with 81mx 40 mm solitaire X ret river device and penumbra aspiration with TICI 3 revascularization  ETT 2/05 >> 2/18 [facility]J CVL  2/5 >>2/18  RT [place] CVL 2/18 >>3/2 RIJ HD cath 2/6 >>2/18 6 shiley cuffed trach 2/18  >>3/4  RT fem HD 2/19 >>3/2 RIJ permacath 3/2>> 3/4 shiley 4 uncuffed.... dislodged placed  back 6cuffed shiley that evening 3/5: change to #6 cuffed distal XLT  3/17 right femoral CVL >> 3/23 3/17 right femoral arterial line >>3/23 3/31 bronchoscopy : airway clear, conclusion = end of trach up against the wall obstructing airflow   3/31 trach change to cuffed #6 XLT  4/7 PEG by IR  Significant Diagnostic Tests:  CT angio head/neck 2/05 >> occlusion of Lt MCA bifurcation Echo 2/05 >> EF less than 20%, cannot rule out apical thrombus MRI brain 2/11 > extensive acute infarction of multiple areas without large or medium vessel occlusion Echocardiogram 2/8 Left ventricular ejection fraction, by estimation, is <20%. The left  ventricle has severely decreased function. The left ventricle demonstrates  global hypokinesis. The left ventricular internal cavity size was severely  dilated. Possible small 0.8 x 0.6 cm apical thrombus.  3/16 duplex upper and lower extremities >>neg  3/17 head CT >.  New area of hypoattenuation in the left pons , left frontal encephalomalacia related to old infarct 3/31CT chestreveals extensive pneumomediastinum and subcutaneous emphysema in the lower neck.  There is less than 5% bilateral pneumothoraces.  Extensive airspace disease right greater than left consistent with ARDS or infection 3/31 head CT >> neg for new pathology 4/23wallow eval:  Micro Data:  SARS CoV2 PCR 2/05 >> negative Influenza PCR 2/05 >> negative ... Cdiff 2/25 >> neg Trach asp 3/11>> Few candida albicans BC 3/11>> 1 of 4 with Coag neg staph  ...  trach 3/24 >  no  wbc, no organisms final - nl flora  Bronchoscopy 3/31  moderate GPC, normal respiratory flora Blood cultures 3/31 Neg   resp 4/11 >> nml flora Blood 4/12 >> ng  Antimicrobials:  mero 2/6 -2/12 Zosyn 2/6 , 2/19 >> 2/23 vanc 2/6 >> 2/9 , 2/19 >>2/22 Ceftriaxone 3/12> 3/16 3/17 vanc >> 3/19 3/17 Anidulafungin >> 3/19 3/17 cefepime >>  3/22  3/31 vancomycin>>4/3 3/31 cefepime>>4/3 Unasyn 4/3>>4/4  4/12  meropenem >>4/14 4/12 vanc >>4/14  Interim history/subjective:   Sitting up in chair, currently eating ice cream.  No distress.  Objective   Blood pressure 94/77, pulse (Abnormal) 110, temperature 98.3 F (36.8 C), temperature source Oral, resp. rate (Abnormal) 26, height 6' 1"  (1.854 m), weight 98.7 kg, SpO2 94 %.    FiO2 (%):  [28 %] 28 %   Intake/Output Summary (Last 24 hours) at 07/08/2019 0851 Last data filed at 07/07/2019 1800 Gross per 24 hour  Intake 1074 ml  Output 494 ml  Net 580 ml   Filed Weights   07/07/19 0420 07/07/19 0927 07/07/19 1344  Weight: 98.8 kg 99.4 kg 98.7 kg     Physical Exam: General: 34 year old male patient resting up in chair he is in no acute distress currently HEENT normocephalic atraumatic pupils equal reactive he currently has a size 6 distal XLT in place, cuff is currently deflated, he is able to phonate, there is no stridor audible Pulmonary: Clear to auscultation diminished bases no accessory use Cardiac regular rate and rhythm Abdomen soft not tender Extremities are warm dry with brisk capillary refill there is trace lower extremity edema Neuro awake, exhibits some expressive aphasia, right-sided weakness persists but has improved  Resolved Hospital Problem list   HCAP 7/93 Acute metabolic encephalopathy 2nd to hypoxia and renal failure  Cardiogenic  shock HIT  Mixed Cardiogenic Septic shock, present on admission Pneumomediastinum, pneumothoraces -resolved by pCXR 4/5  Hypotension-Levophed stopped again 4/14 -Intermittent junctional bradycardia noted 4/13, Amio stopped  Intermittent tracheal site bleeding  Acute on chronic hypoxic respiratory failure (has been on 28% ATC since 4/14) Assessment & Plan:   Tracheostomy status s/p CVA and prolonged critical illness improved-XLT distal cuffed trach changed 4/14 End stage cardiomyopathy/chronic systolic heart failure End stage renal failure-multifactorial etiology including ATN following  cardiogenic/septic shock; anuric and without evidence of renal recovery Uncontrolled diabetes type 2 -better controlled  Intermittent agitation  Discussion From a tracheostomy standpoint patient is doing quite well.  He is no longer ventilator dependent.  He is requiring minimal oxygen.  He does have underlying severe sleep apnea and was CPAP dependent prior to his acute illness.  I think he may be able to be decannulated in the future, however we will need to ensure he can tolerate and be compliant with sleep apnea measures in regards to CPAP device.  I think were still a ways away from that. Plan Continue current routine tracheostomy care I will see again on 4/26 with plan to change to cuffless tracheostomy, I do not think he will require XLT at this point and we should be able to safely change him to a regular Shiley. I have asked his family to bring in his CPAP device, would continue current PMV as much as tolerated during daytime, and later next week we can evaluate and consider capping trials with nocturnal CPAP.  Again this needs to be carried out very slowly given his underlying cardiomyopathy and recent stroke, I worry inadequate treatment of his underlying OSA could have significant consequences on his overall health   Best practice:  Diet: Holding tube feeds due to retracted foley. Will obtain enteral access via Dobhoff if foley/PEG retracted too far Pain/Anxiety/Delirium protocol (if indicated): PRNs VAP protocol (if indicated): N/A DVT prophylaxis: Coumadin and Angiomax GI prophylaxis: PPI Glucose control: SSI Mobility: Mobilize as able  Code Status: Full   Erick Colace ACNP-BC [place] Pager # [phone] OR # [phone] if no answer

## 2019-07-08 NOTE — Progress Notes (Signed)
Patient MEWS score changed from yellow to red at 0800, because of HR, RR and BP. MEWS has been yellow before shift change, it changed back to yellow, MD notified, no new order at this time. Will follow protocol. Will continue to monitor patient.

## 2019-07-08 NOTE — Progress Notes (Signed)
Physical Therapy Treatment Patient Details Name: Carlos Brewer. MRN: [id number] DOB: 01-20-1986 Today's Date: 07/08/2019    History of Present Illness Pt is a 34 y/o male smoker who initially presented on 2/5 with slurred speech and Rt sided weakness. Admitted with left MCA CVA and multiple smaller embolic infarcts s/p tPA and thrombectomy in IR.  Additionally found to have left ventricular  thrombus. Hospital course complicated by septic shock r/t HCAP, AKI requiring CRRT, polymorphic VT and cardiogenic shock. Echo EF < 20%. Pt with Intermittent pressor dependence, required tracheostomy. Tolerating trach collar 3/4, back on vent 3/5 with flash pulmonary edema.  Started IHD 3/10.  G-tube placed 06/23/19, pt removed and replaced 07/04/19.    PT Comments    Pt seamed eager to get up, but once moving, he fatigued quickly and was rather low tone.   Emphasis on transitions, sitting balance, sit to stand into a RW and transfer bed to chair.    Follow Up Recommendations  LTACH     Equipment Recommendations  Other (comment)(TBA)    Recommendations for Other Services       Precautions / Restrictions Precautions Precautions: Fall Precaution Comments: trach collar, PEG w/abdominal binder, flexiseal, bil mitts and wrist restraints    Mobility  Bed Mobility Overal bed mobility: Needs Assistance Bed Mobility: Supine to Sit     Supine to sit: Max assist;+2 for safety/equipment     General bed mobility comments: cues and directional assist to overcome some of his apraxias.  Truncal assist forward and up to sit up at EOB.    Transfers Overall transfer level: Needs assistance Equipment used: 2 person hand held assist;Rolling walker (2 wheeled) Transfers: Sit to/from Stand Sit to Stand: Max assist;From elevated surface;+2 physical assistance(x3) Stand pivot transfers: Max assist;+2 physical assistance       General transfer comment: directional cues for standing and work on  w/shift and stepping.  Pt then pivoted to the chair with 4 pivotal steps, RW and maximal assist for stability and w/shifting.  Ambulation/Gait             General Gait Details: unable   Stairs             Wheelchair Mobility    Modified Rankin (Stroke Patients Only) Modified Rankin (Stroke Patients Only) Modified Rankin: Severe disability     Balance Overall balance assessment: Needs assistance Sitting-balance support: Feet supported Sitting balance-Leahy Scale: Poor Sitting balance - Comments: worked at EOB on sitting balance in midline.  Pt could sit for seconds, but invariably would eventually fall slowly backward.     Standing balance-Leahy Scale: Poor Standing balance comment: sit to stand x3, with the initiation of pregait and standing activity.                            Cognition Arousal/Alertness: Awake/alert Behavior During Therapy: Restless Overall Cognitive Status: Impaired/Different from baseline                     Current Attention Level: Sustained   Following Commands: Follows one step commands with increased time Safety/Judgement: Decreased awareness of safety;Decreased awareness of deficits Awareness: Intellectual Problem Solving: Slow processing;Decreased initiation        Exercises Other Exercises Other Exercises: warm up resistive hip/knee flex/ext exercise    General Comments        Pertinent Vitals/Pain Faces Pain Scale: No hurt    Home Living  Prior Function            PT Goals (current goals can now be found in the care plan section) Acute Rehab PT Goals Patient Stated Goal: to d/c home with mother after rehab PT Goal Formulation: With patient Time For Goal Achievement: 07/18/19 Potential to Achieve Goals: Fair Progress towards PT goals: Progressing toward goals    Frequency    Min 3X/week      PT Plan Current plan remains appropriate    Co-evaluation               AM-PAC PT "6 Clicks" Mobility   Outcome Measure  Help needed turning from your back to your side while in a flat bed without using bedrails?: Total Help needed moving from lying on your back to sitting on the side of a flat bed without using bedrails?: Total Help needed moving to and from a bed to a chair (including a wheelchair)?: Total Help needed standing up from a chair using your arms (e.g., wheelchair or bedside chair)?: Total Help needed to walk in hospital room?: Total Help needed climbing 3-5 steps with a railing? : Total 6 Click Score: 6    End of Session Equipment Utilized During Treatment: Oxygen;Other (comment)(pt able to complete the session without use of sup. oxygen) Activity Tolerance: Patient limited by lethargy Patient left: in chair;with call bell/phone within reach;with family/visitor present Nurse Communication: Mobility status PT Visit Diagnosis: Other abnormalities of gait and mobility (R26.89);Muscle weakness (generalized) (M62.81);Other symptoms and signs involving the nervous system (R29.898)     Time: 1410-1440 PT Time Calculation (min) (ACUTE ONLY): 30 min  Charges:  $Therapeutic Activity: 8-22 mins $Neuromuscular Re-education: 8-22 mins                     07/08/2019  Ginger Carne., PT Acute Rehabilitation Services [phone]  (pager) [phone]  (office)   [name] 07/08/2019, 5:04 PM

## 2019-07-08 NOTE — Progress Notes (Signed)
PROGRESS NOTE    Carlos Brewer.  QVZ:[id number] DOB: 1986-03-11 DOA: 05/14/2019 PCP: Patient, No Pcp Per    Brief Narrative:  34 yo male smoker found to have slurred speech and Rt sided weakness. Admitted 2/5 with left MCA CVA and multiple smaller embolic infarcts>>>tPA and thrombectomy in IR. Additionally found to have left ventricular thrombus. Course complicated by septic shock r/t HCAP, AKI requiring CRRT, polymorphic VT and cardiogenic shock. Intermittent pressor dependence, required tracheostomy, intermittent hemodialysis, pontine CVA, Heart failure with reduced ejection fraction < 15% on last echocardiogram, Recurrent episodes of bilateral infiltrates and mechanical ventilation, ??Aspiration pneumonitis.  Patient has been somewhat stabilized in the ICU.  [facility] hospitalist to resume care on 07/03/2019  Assessment & Plan:   Active Problems:   Occlusion of left middle cerebral artery   Middle cerebral artery embolism, left   Cerebrovascular accident (CVA) due to thrombosis of left middle cerebral artery (HCC)   Shock ([place])   Shock liver   Respiratory failure (HCC)   Pressure injury of skin   Tracheostomy tube present ([place])   Acute CVA (cerebrovascular accident) ([place])   Acute kidney injury ([place])   Endotracheal tube present   Complication of tracheostomy tube ([place])   Acute hypoxemic respiratory failure (HCC)   Fever   Type 2 diabetes mellitus with other specified complication (HCC)   Hypoxia   HFrEF (heart failure with reduced ejection fraction) ([place])   Pneumothorax   Pneumomediastinum (HCC)   Hemoptysis   Palliative care encounter   Acute renal failure ([place])   Acute on chronic respiratory failure Tracheostomy with intermittent bleeding Stable Currently continued on trach collar, stable at 28% FiO2 Continue Pulmonary hygiene PCCM following for trach/vent needs Per nursing staff, noted to have only small to moderate amounts of sputum suctioned out On exam,  lungs remain clear. Occasional cough  End-stage cardiomyopathy/chronic systolic HF/polymorphic VT/LV thrombus Followed by cardiology, signed off on 4/3 as patient is not a candidate for advanced heart failure therapies Continue hydralazine, midodrine INR currently 4.0. Cont dosing per pharmacy Cont on tele  End-stage renal failure Multifactorial including ATN following cardiogenic/septic shock Anuric without evidence of renal recovery thus far Status post CRRT, stopped on 4/15 Currently tolerating HD per Nephrology  Diabetes mellitus type 2 SSI, Lantus, Accu-Cheks, hypoglycemic protocol Glycemic trends thus far stable  HIT Continue Angiomax For the management by pharmacy  CVA on admission Continue with ASA, Lipitor as tolerated  Radiopaque density in abdomen Suspicion for ingested foreign body Serial KUBs with density perhaps in descending colon Repeat abd xray from today reviewed. Foreign body seen previously no longer present  ICU delirium/intermittent agitation Noted to keep pulling out NG tube, PEG tube earlier, prompting restraints PEG tube replaced by IR on 07/04/2019 Continue Seroquel as tolerated Pt following simple commands (gives thumbs-up when ask to do so)  Nutrition/dysphagia PEG tube replaced by IR on 07/04/2019 Continue with nutrition as tolerated  Goals of care Poor long term prognosis Palliative care on board-full scope of treatment   DVT prophylaxis: SCD's Code Status: Full Family Communication: Pt in room, family not at bedside  Status is: Inpatient  Remains inpatient appropriate because:Altered mental status and IV treatments appropriate due to intensity of illness or inability to take PO   Dispo: The patient is from: Home              Anticipated d/c is to: Complicated disposition currently being worked on by [facility], possible LTAC  Anticipated d/c date is: > 3 days              Patient currently is not medically stable to  d/c.        Consultants:   PCCM  Cardiology/EP  Nephrology  Neurology  IR  Palliative  Procedures:  2/5-2/6:  TPA given at 428 am (total of 90 Mg) 520 am went to IR  S/P Lt common carotid arteriogram followed by complete revascularization of occluded LT MCA sup division mid M2 seg with x 1 pass with 64mx 40 mm solitaire X ret river device and penumbra aspiration with TICI3revascularization  ETT 2/05 >> 2/18 LIJ CVL 2/5 >>2/18  RT [place] CVL 2/18 >>3/2 RIJ HD cath 2/6 >>2/18 6 shiley cuffed trach 2/18 >>3/4  RT fem HD 2/19 >>3/2 RIJ permacath 3/2>> 3/4 shiley 4 uncuffed.... dislodged placed back 6cuffed shiley that evening 3/5: change to #6 cuffed distal XLT  3/17 right femoral CVL >> 3/23 3/17 right femoral arterial line >>3/23 3/31 bronchoscopy : airway clear, conclusion = end of trach up against the wall obstructing airflow   3/31 trach change to cuffed #6 XLT  4/7 PEG by IR  Antimicrobials: Anti-infectives (From admission, onward)   Start     Dose/Rate Route Frequency Ordered Stop   06/28/19 1100  vancomycin (VANCOREADY) IVPB 1250 mg/250 mL  Status:  Discontinued     1,250 mg 166.7 mL/hr over 90 Minutes Intravenous Every 24 hours 06/27/19 0925 06/29/19 1011   06/27/19 1400  meropenem (MERREM) 1 g in sodium chloride 0.9 % 100 mL IVPB  Status:  Discontinued     1 g 200 mL/hr over 30 Minutes Intravenous Every 8 hours 06/27/19 0950 06/27/19 0952   06/27/19 1045  meropenem (MERREM) 1 g in sodium chloride 0.9 % 100 mL IVPB  Status:  Discontinued     1 g 200 mL/hr over 30 Minutes Intravenous Every 8 hours 06/27/19 0952 06/29/19 1011   06/27/19 1015  meropenem (MERREM) 1 g in sodium chloride 0.9 % 100 mL IVPB  Status:  Discontinued     1 g 200 mL/hr over 30 Minutes Intravenous Every 24 hours 06/27/19 0925 06/27/19 0950   06/27/19 1015  vancomycin (VANCOREADY) IVPB 2000 mg/400 mL     2,000 mg 200 mL/hr over 120 Minutes Intravenous  Once 06/27/19 0925  06/27/19 1246   06/23/19 1554  ceFAZolin (ANCEF) IVPB 2g/100 mL premix     over 30 Minutes  Continuous PRN 06/23/19 1554 06/23/19 1554   06/23/19 1544  ceFAZolin (ANCEF) 2-4 GM/100ML-% IVPB    Note to Pharmacy: PTamsen Snider  : cabinet override      06/23/19 1544 06/24/19 0359   06/18/19 1130  Ampicillin-Sulbactam (UNASYN) 3 g in sodium chloride 0.9 % 100 mL IVPB     3 g 200 mL/hr over 30 Minutes Intravenous Every 8 hours 06/18/19 1039 06/19/19 2119   06/16/19 1400  ceFEPIme (MAXIPIME) 2 g in sodium chloride 0.9 % 100 mL IVPB  Status:  Discontinued     2 g 200 mL/hr over 30 Minutes Intravenous Every 12 hours 06/16/19 1216 06/18/19 1010   06/16/19 1400  vancomycin (VANCOREADY) IVPB 1250 mg/250 mL  Status:  Discontinued     1,250 mg 166.7 mL/hr over 90 Minutes Intravenous Every 24 hours 06/16/19 1216 06/18/19 1010   06/16/19 1200  vancomycin (VANCOCIN) IVPB 1000 mg/200 mL premix  Status:  Discontinued     1,000 mg 200 mL/hr over 60 Minutes Intravenous Every  T-Th-Sa (Hemodialysis) 06/15/19 0459 06/16/19 1216   06/15/19 2200  ceFEPIme (MAXIPIME) 1 g in sodium chloride 0.9 % 100 mL IVPB  Status:  Discontinued     1 g 200 mL/hr over 30 Minutes Intravenous Every 24 hours 06/15/19 0459 06/16/19 1216   06/15/19 0500  vancomycin (VANCOREADY) IVPB 2000 mg/400 mL     2,000 mg 200 mL/hr over 120 Minutes Intravenous  Once 06/15/19 0459 06/15/19 0802   06/15/19 0500  ceFEPIme (MAXIPIME) 2 g in sodium chloride 0.9 % 100 mL IVPB     2 g 200 mL/hr over 30 Minutes Intravenous  Once 06/15/19 0459 06/15/19 0941   06/04/19 2200  ceFEPIme (MAXIPIME) 1 g in sodium chloride 0.9 % 100 mL IVPB    Note to Pharmacy: Pharmacy may modify dose as appropriate for renal failure   1 g 200 mL/hr over 30 Minutes Intravenous Daily at bedtime 06/04/19 1347 06/06/19 2303   06/02/19 1200  anidulafungin (ERAXIS) 100 mg in sodium chloride 0.9 % 100 mL IVPB  Status:  Discontinued     100 mg 78 mL/hr over 100 Minutes  Intravenous Every 24 hours 06/01/19 1049 06/04/19 1053   06/01/19 2200  ceFEPIme (MAXIPIME) 1 g in sodium chloride 0.9 % 100 mL IVPB  Status:  Discontinued    Note to Pharmacy: Pharmacy may modify dose as appropriate for renal failure   1 g 200 mL/hr over 30 Minutes Intravenous Daily at bedtime 06/01/19 1054 06/04/19 1053   06/01/19 1300  vancomycin (VANCOCIN) IVPB 1000 mg/200 mL premix     1,000 mg 200 mL/hr over 60 Minutes Intravenous  Once 06/01/19 1203 06/01/19 1401   06/01/19 1200  vancomycin (VANCOCIN) IVPB 1000 mg/200 mL premix  Status:  Discontinued     1,000 mg 200 mL/hr over 60 Minutes Intravenous Every M-W-F (Hemodialysis) 06/01/19 0428 06/04/19 1053   06/01/19 1100  anidulafungin (ERAXIS) 200 mg in sodium chloride 0.9 % 200 mL IVPB     200 mg 78 mL/hr over 200 Minutes Intravenous  Once 06/01/19 1049 06/01/19 2002   06/01/19 1045  anidulafungin (ERAXIS) 100 mg in sodium chloride 0.9 % 100 mL IVPB  Status:  Discontinued     100 mg 78 mL/hr over 100 Minutes Intravenous Every 24 hours 06/01/19 1038 06/01/19 1048   06/01/19 0300  ceFEPIme (MAXIPIME) 1,000 mg in sodium chloride 0.9 % 100 mL IVPB  Status:  Discontinued    Note to Pharmacy: Pharmacy may modify dose as appropriate for renal failure   1,000 mg 200 mL/hr over 30 Minutes Intravenous Daily at bedtime 06/01/19 0250 06/01/19 1054   06/01/19 0300  vancomycin (VANCOREADY) IVPB 2000 mg/400 mL     2,000 mg 200 mL/hr over 120 Minutes Intravenous  Once 06/01/19 0252 06/01/19 0843   05/27/19 1000  cefTRIAXone (ROCEPHIN) 2 g in sodium chloride 0.9 % 100 mL IVPB     2 g 200 mL/hr over 30 Minutes Intravenous Every 24 hours 05/27/19 0946 05/31/19 0952   05/17/19 0000  ceFAZolin (ANCEF) IVPB 2g/100 mL premix     2 g 200 mL/hr over 30 Minutes Intravenous To Radiology 05/16/19 1431 05/17/19 1556   05/09/19 1200  piperacillin-tazobactam (ZOSYN) IVPB 3.375 g     3.375 g 100 mL/hr over 30 Minutes Intravenous Every 6 hours 05/09/19 1029  05/10/19 1830   05/08/19 1800  piperacillin-tazobactam (ZOSYN) IVPB 3.375 g  Status:  Discontinued     3.375 g 12.5 mL/hr over 240 Minutes Intravenous Every 6 hours 05/08/19  1318 05/09/19 1029   05/07/19 1100  vancomycin (VANCOCIN) IVPB 1000 mg/200 mL premix  Status:  Discontinued     1,000 mg 200 mL/hr over 60 Minutes Intravenous Every 24 hours 05/06/19 0949 05/09/19 1019   05/06/19 1030  piperacillin-tazobactam (ZOSYN) IVPB 3.375 g  Status:  Discontinued     3.375 g 12.5 mL/hr over 240 Minutes Intravenous Every 6 hours 05/06/19 0949 05/08/19 1318   05/06/19 1000  vancomycin (VANCOREADY) IVPB 2000 mg/400 mL     2,000 mg 200 mL/hr over 120 Minutes Intravenous  Once 05/06/19 0949 05/06/19 1240   04/24/19 0600  meropenem (MERREM) 1 g in sodium chloride 0.9 % 100 mL IVPB     1 g 200 mL/hr over 30 Minutes Intravenous Every 8 hours 04/23/19 2024 04/29/19 2138   04/23/19 2000  meropenem (MERREM) 2 g in sodium chloride 0.9 % 100 mL IVPB     2 g 200 mL/hr over 30 Minutes Intravenous Every 8 hours 04/23/19 1956 04/23/19 2057   04/23/19 1000  vancomycin (VANCOREADY) IVPB 1250 mg/250 mL  Status:  Discontinued     1,250 mg 166.7 mL/hr over 90 Minutes Intravenous Every 24 hours 04/23/19 0907 04/26/19 1003   04/23/19 1000  piperacillin-tazobactam (ZOSYN) IVPB 3.375 g  Status:  Discontinued     3.375 g 12.5 mL/hr over 240 Minutes Intravenous Every 8 hours 04/23/19 0907 04/23/19 1955   05/10/2019 0549  ceFAZolin (ANCEF) 2-4 GM/100ML-% IVPB    Note to Pharmacy: Fredric Dine   : cabinet override      04/18/2019 0549 05/12/2019 1759      Subjective: Difficult to assess as pt not verbal.   Objective: Vitals:   07/08/19 1208 07/08/19 1242 07/08/19 1408 07/08/19 1531  BP: (!) 92/51  95/71   Pulse: (!) 106  (!) 112 (!) 108  Resp: 14  (!) 23 (!) 21  Temp:  98.9 F (37.2 C) 98.7 F (37.1 C)   TempSrc: Axillary  Oral   SpO2: 100%  96% 98%  Weight:      Height:        Intake/Output Summary (Last  24 hours) at 07/08/2019 1555 Last data filed at 07/07/2019 1800 Gross per 24 hour  Intake 180 ml  Output --  Net 180 ml   Filed Weights   07/07/19 0420 07/07/19 0927 07/07/19 1344  Weight: 98.8 kg 99.4 kg 98.7 kg    Examination: General exam: Not conversant, in no acute distress Respiratory system: normal chest rise, clear, no audible wheezing, occasional cough Cardiovascular system: regular rhythm, s1-s2 Gastrointestinal system: Nondistended, nontender, pos BS Central nervous system: No seizures, no tremors Extremities: No cyanosis, no joint deformities Skin: No rashes, no pallor Psychiatry: difficult to assess given nonverbal state  Data Reviewed: I have personally reviewed following labs and imaging studies  CBC: Recent Labs  Lab 07/04/19 0313 07/05/19 0325 07/06/19 0302 07/07/19 0634 07/08/19 0458  WBC 8.4 7.3 6.8 7.6 8.1  NEUTROABS  --   --  4.8 5.6 6.3  HGB 9.9* 10.1* 9.7* 9.5* 8.8*  HCT 33.0* 33.9* 33.2* 32.1* 30.4*  MCV 98.5 97.7 99.4 97.9 96.8  PLT 389 358 364 399 263   Basic Metabolic Panel: Recent Labs  Lab 07/02/19 0342 07/03/19 0313 07/04/19 0313 07/05/19 0325 07/06/19 0302 07/07/19 0634 07/08/19 0458  NA 137   < > 136 139 139 136 135  K 3.8   < > 3.9 4.6 4.5 4.0 4.0  CL 96*   < > 95* 97*  97* 92* 97*  CO2 20*   < > 21* 19* 25 26 25   GLUCOSE 138*   < > 195* 190* 126* 179* 201*  BUN 104*   < > 78* 116* 55* 90* 39*  CREATININE 4.74*   < > 6.20* 6.65* 3.78* 4.74* 3.12*  CALCIUM 9.9   < > 9.5 9.2 9.5 9.8 9.5  MG 3.3*  --   --   --   --   --   --    < > = values in this interval not displayed.   GFR: Estimated Creatinine Clearance: 40.9 mL/min (A) (by C-G formula based on SCr of 3.12 mg/dL (H)). Liver Function Tests: No results for input(s): AST, ALT, ALKPHOS, BILITOT, PROT, ALBUMIN in the last 168 hours. No results for input(s): LIPASE, AMYLASE in the last 168 hours. No results for input(s): AMMONIA in the last 168 hours. Coagulation  Profile: Recent Labs  Lab 07/04/19 1427 07/05/19 0325 07/06/19 0302 07/07/19 0634 07/08/19 0458  INR 2.5* 2.1* 2.2* 3.0* 4.0*   Cardiac Enzymes: No results for input(s): CKTOTAL, CKMB, CKMBINDEX, TROPONINI in the last 168 hours. BNP (last 3 results) No results for input(s): PROBNP in the last 8760 hours. HbA1C: No results for input(s): HGBA1C in the last 72 hours. CBG: Recent Labs  Lab 07/08/19 0009 07/08/19 0408 07/08/19 0722 07/08/19 1115 07/08/19 1546  GLUCAP 188* 180* 159* 137* 199*   Lipid Profile: No results for input(s): CHOL, HDL, LDLCALC, TRIG, CHOLHDL, LDLDIRECT in the last 72 hours. Thyroid Function Tests: No results for input(s): TSH, T4TOTAL, FREET4, T3FREE, THYROIDAB in the last 72 hours. Anemia Panel: No results for input(s): VITAMINB12, FOLATE, FERRITIN, TIBC, IRON, RETICCTPCT in the last 72 hours. Sepsis Labs: No results for input(s): PROCALCITON, LATICACIDVEN in the last 168 hours.  No results found for this or any previous visit (from the past 240 hour(s)).   Radiology Studies: DG Abd 1 View  Result Date: 07/08/2019 CLINICAL DATA:  Question foreign body. EXAM: ABDOMEN - 1 VIEW COMPARISON:  07/03/2019. FINDINGS: Gastrostomy tube noted with tip over the left upper abdomen. Contrast noted in the proximal small bowel. Previously identified metallic density noted over the left colon no longer identified. The left upper abdomen however is incompletely imaged. No acute bony abnormality. IMPRESSION: 1. Gastrostomy tube noted with tip over the left upper abdomen. Oral contrast noted in the proximal small bowel. 2. Left upper abdomen is incompletely imaged. No metallic foreign body identified. Previously identified metallic density noted on abdomen of 07/03/2019 no longer identified. Electronically Signed   By: Marcello Moores  Register   On: 07/08/2019 11:43   DG Swallowing Func-Speech Pathology  Result Date: 07/08/2019 Objective Swallowing Evaluation: Type of Study:  MBS-Modified Barium Swallow Study  Patient Details Name: Stephanos Fan. MRN: [id number] Date of Birth: June 26, 1985 Today's Date: 07/08/2019 Time: SLP Start Time (ACUTE ONLY): 0930 -SLP Stop Time (ACUTE ONLY): 0950 SLP Time Calculation (min) (ACUTE ONLY): 20 min Past Medical History: Past Medical History: Diagnosis Date . CHF (congestive heart failure) ([place])  . Cocaine abuse ([place])  . HFrEF (heart failure with reduced ejection fraction) ([place])   a. 10/2016 Echo: EF 15-20%, Gr2 DD, mildly dil LA/RA. Marland Kitchen History of cardiac cath   a. 10/2016 Cath: LM nl, LAD min irregs, LCX nl, RCA nl, EF 15%. . Morbid obesity ([place])  . Myocarditis ([place])   a. 10/2016 Admit w/ CHF and trop elevation; b. 10/2016 Echo: EF 15-20%; c. 10/2016 Cath: Min irregs in LAD otw nl  cors, EF 15%, glob HK. Marland Kitchen NICM (nonischemic cardiomyopathy) ([place])   a. 10/2016 Echo: EF 15-20%, Gr2 DD; b. 10/2016 Cath: Nl cors. . Palpitations  . Sleep apnea   USES CPAP . Tobacco abuse  Past Surgical History: Past Surgical History: Procedure Laterality Date . CORONARY ANGIOPLASTY   . IR CT HEAD LTD  05/12/2019 . IR FLUORO GUIDE CV LINE RIGHT  05/17/2019 . IR GASTROSTOMY TUBE MOD SED  06/23/2019 . IR PATIENT EVAL TECH 0-60 MINS  04/23/2019 . IR PERCUTANEOUS ART THROMBECTOMY/INFUSION INTRACRANIAL INC DIAG ANGIO  05/15/2019 . IR REPLC GASTRO/COLONIC TUBE PERCUT W/FLUORO  07/04/2019 . IR US GUIDE VASC ACCESS RIGHT  05/17/2019 . NO PAST SURGERIES   . RADIOLOGY WITH ANESTHESIA N/A 04/29/2019  Procedure: RADIOLOGY WITH ANESTHESIA;  Surgeon: Luanne Bras, MD;  Location: [facility];  Service: Radiology;  Laterality: N/A; . RIGHT/LEFT HEART CATH AND CORONARY ANGIOGRAPHY N/A 10/27/2016  Procedure: RIGHT/LEFT HEART CATH AND CORONARY ANGIOGRAPHY;  Surgeon: Wellington Hampshire, MD;  Location: [facility];  Service: Cardiovascular;  Laterality: N/A; . WISDOM TOOTH EXTRACTION  2008 HPI: Pt is a 34 y/o male smoker who initially presented on 2/5 with slurred speech and Rt sided weakness. Admitted with  left MCA CVA and multiple smaller embolic infarcts s/p tPA and thrombectomy in IR.  CT head 3/31: subacute to chronic infarctions of the frontal lobes, right cerebellar hemisphere, and pons.  Additionally found to have left ventricular  thrombus. Hospital course complicated by septic shock r/t HCAP, AKI requiring CRRT, polymorphic VT and cardiogenic shock. Echo EF < 20%. Pt with Intermittent pressor dependence, required tracheostomy 2/18.  Has been in and out of ICU and has required intermittent vent support. G-tube placed 06/23/19. Sent back to ICU 4/11 for CRRT.  Subjective: alert, cooperative Assessment / Plan / Recommendation CHL IP CLINICAL IMPRESSIONS 07/08/2019 Clinical Impression Pt demonstrated a mild pharyngeal dysphagia with intermittent trace aspiration of thin liquids.  Aspiration occurred before the onset of the pharyngeal swallow, with liquids spilling into the laryngeal vestibule before it could be closed.  Aspiration elicited a consistent cough response. (PMV was in place for procedure).  Nectar-thick liquids transited through the pharynx without incident (no aspiration).  Purees and mechanical solids were masticated with mild diffuse residue remaining in the oral cavity after the swallow; they were propelled through pharynx with good clearance, no residue post-swallow.  Recommend resuming an oral diet of dysphagia 3; nectar-thick liquids for now.  SLP to focus on improving timing of swallow with thin liquids; diet can be upgraded per clinical judgment since aspiration is accompanied by a cough response.  D/W RN.  Will follow. SLP Visit Diagnosis Dysphagia, pharyngeal phase (R13.13) Attention and concentration deficit following -- Frontal lobe and executive function deficit following -- Impact on safety and function Mild aspiration risk   CHL IP TREATMENT RECOMMENDATION 07/08/2019 Treatment Recommendations Therapy as outlined in treatment plan below   Prognosis 07/08/2019 Prognosis for Safe Diet  Advancement Good Barriers to Reach Goals -- Barriers/Prognosis Comment -- CHL IP DIET RECOMMENDATION 07/08/2019 SLP Diet Recommendations Dysphagia 3 (Mech soft) solids;Nectar thick liquid Liquid Administration via Cup;Straw Medication Administration Whole meds with puree Compensations Small sips/bites Postural Changes --   CHL IP OTHER RECOMMENDATIONS 07/08/2019 Recommended Consults -- Oral Care Recommendations Oral care BID Other Recommendations Order thickener from pharmacy;Place PMSV during PO intake   CHL IP FOLLOW UP RECOMMENDATIONS 07/06/2019 Follow up Recommendations 24 hour supervision/assistance;LTACH;Skilled Nursing facility   [facility] IP FREQUENCY AND DURATION 07/08/2019 Speech Therapy  Frequency (ACUTE ONLY) min 2x/week Treatment Duration 2 weeks      CHL IP ORAL PHASE 07/08/2019 Oral Phase Impaired Oral - Pudding Teaspoon -- Oral - Pudding Cup -- Oral - Honey Teaspoon -- Oral - Honey Cup -- Oral - Nectar Teaspoon WFL Oral - Nectar Cup -- Oral - Nectar Straw WFL Oral - Thin Teaspoon NT Oral - Thin Cup -- Oral - Thin Straw WFL Oral - Puree Lingual/palatal residue Oral - Mech Soft Lingual/palatal residue;Right pocketing in lateral sulci;Left pocketing in lateral sulci Oral - Regular -- Oral - Multi-Consistency -- Oral - Pill -- Oral Phase - Comment --  CHL IP PHARYNGEAL PHASE 07/08/2019 Pharyngeal Phase Impaired Pharyngeal- Pudding Teaspoon -- Pharyngeal -- Pharyngeal- Pudding Cup -- Pharyngeal -- Pharyngeal- Honey Teaspoon -- Pharyngeal -- Pharyngeal- Honey Cup -- Pharyngeal -- Pharyngeal- Nectar Teaspoon -- Pharyngeal -- Pharyngeal- Nectar Cup Delayed swallow initiation-pyriform sinuses;Delayed swallow initiation-vallecula Pharyngeal -- Pharyngeal- Nectar Straw Delayed swallow initiation-vallecula;Delayed swallow initiation-pyriform sinuses Pharyngeal -- Pharyngeal- Thin Teaspoon -- Pharyngeal -- Pharyngeal- Thin Cup Delayed swallow initiation-pyriform sinuses;Penetration/Aspiration before swallow;Trace aspiration  Pharyngeal Material enters airway, passes BELOW cords and not ejected out despite cough attempt by patient Pharyngeal- Thin Straw Delayed swallow initiation-pyriform sinuses;Trace aspiration;Penetration/Aspiration before swallow Pharyngeal Material enters airway, passes BELOW cords and not ejected out despite cough attempt by patient Pharyngeal- Puree WFL Pharyngeal -- Pharyngeal- Mechanical Soft WFL Pharyngeal -- Pharyngeal- Regular -- Pharyngeal -- Pharyngeal- Multi-consistency -- Pharyngeal -- Pharyngeal- Pill -- Pharyngeal -- Pharyngeal Comment --  No flowsheet data found. [name] 07/08/2019, 10:47 AM               Scheduled Meds: . ARIPiprazole  10 mg Per Tube Daily  . aspirin  81 mg Per Tube Daily  . atorvastatin  40 mg Per Tube q1800  . chlorhexidine gluconate (MEDLINE KIT)  15 mL Mouth Rinse BID  . Chlorhexidine Gluconate Cloth  6 each Topical Q0600  . Chlorhexidine Gluconate Cloth  6 each Topical Q0600  . Darbepoetin Alfa  150 mcg Intravenous Q Mon-1800  . feeding supplement (PRO-STAT SUGAR FREE 64)  60 mL Per Tube BID  . FLUoxetine  20 mg Per Tube Daily  . free water  100 mL Per Tube Q4H  . insulin aspart  0-20 Units Subcutaneous Q4H  . insulin detemir  15 Units Subcutaneous BID  . isosorbide-hydrALAZINE  1 tablet Per Tube BID  . mouth rinse  15 mL Mouth Rinse 10 times per day  . midodrine  10 mg Per Tube Q8H  . pantoprazole sodium  40 mg Per Tube Q1200  . QUEtiapine  50 mg Per Tube QHS  . Resource ThickenUp Clear   Oral Once  . sodium chloride flush  10-40 mL Intracatheter Q12H  . Warfarin - Pharmacist Dosing Inpatient   Does not apply q1600   Continuous Infusions: . anticoagulant sodium citrate    . dextrose Stopped (07/03/19 1141)  . dextrose Stopped (07/02/19 1730)  . feeding supplement (VITAL 1.5 CAL) 1,000 mL (07/06/19 1901)     LOS: 77 days   Marylu Lund, MD [facility] Pager On Amion  If 7PM-7AM, please contact night-coverage 07/08/2019,  3:55 PM

## 2019-07-08 NOTE — Progress Notes (Signed)
Visited Carlos Brewer and Carlos at bedside.  Carlos Brewer is smiling and using his Passy-Muir valve.  [name], [facility] PAs-2 sits and feeds Carlos Brewer tastes of chocolate ice cream which he seemed to enjoy immensely.  Carlos Brewer tells me that she understands he is at risk for infections, wounds, and decline but at this point she is thankful to God as he has come so far.  She explained that he will discharged to the [facility] rehab facility in [place].  Family has our contact information and will reach out if we can support them in any way.  Florentina Jenny, PA-C Palliative Medicine Office:  [phone]  No charge note

## 2019-07-08 NOTE — TOC Progression Note (Signed)
Transition of Care (TOC) - Progression Note    Patient Details  Name: Carlos Brewer. MRN: [id number] Date of Birth: 24-Apr-1985  Transition of Care [facility]) CM/SW Contact  Pollie Friar, RN Phone Number: 07/08/2019, 2:55 PM  Clinical Narrative:    [facility] is unable to accept the patient for [facility]. CM has updated Cathy with the [place] and the only options are LTACH in [place] if they would accept, taking the patient home or transferring to the [facility] hospital. CM went over this with the patient and his mother. Decision has been made to start papers for transfer to [facility].  CM will fax paperwork to [phone].[facility] will need to look these over and update on bed availability.  TOC following.   Expected Discharge Plan: Long Term Acute Care (LTAC) Barriers to Discharge: Continued Medical Work up  Expected Discharge Plan and Services Expected Discharge Plan: [place] (LTAC)                                               Social Determinants of Health (SDOH) Interventions    Readmission Risk Interventions No flowsheet data found.

## 2019-07-08 NOTE — Progress Notes (Signed)
ANTICOAGULATION CONSULT NOTE - Follow Up Consult  Pharmacy Consult for Coumadin Indication: LV apical thrombus + CVA,  Allergies  Allergen Reactions  . Heparin Other (See Comments)    Heparin induced thrombocytopenia. 2/15 HIT OD 1.692. 2/16 SRA positive-90.     Patient Measurements: Height: 6\' 1"  (185.4 cm) Weight: 98.7 kg (217 lb 9.5 oz) IBW/kg (Calculated) : 79.9   Vital Signs: Temp: 97.9 F (36.6 C) (04/23 1049) Temp Source: Oral (04/23 1049) BP: 94/77 (04/23 0808) Pulse Rate: 108 (04/23 0901)  Labs: Recent Labs    07/06/19 0302 07/06/19 0302 07/07/19 0634 07/08/19 0458  HGB 9.7*   < > 9.5* 8.8*  HCT 33.2*  --  32.1* 30.4*  PLT 364  --  399 358  LABPROT 24.7*  --  30.7* 39.0*  INR 2.2*  --  3.0* 4.0*  CREATININE 3.78*  --  4.74* 3.12*   < > = values in this interval not displayed.    Estimated Creatinine Clearance: 40.9 mL/min (A) (by C-G formula based on SCr of 3.12 mg/dL (H)).  Assessment: Anticoag: Small LV apical thrombus + CVA, +HIT - on warfarin. *SRA positive (90) - Heparin allergy on chart. Bivalrudin restarted on 4/15  - Goal aPTT 50-85 seconds  4/19: Bival stopped @ 0321 4/19: did not receive Coumadin dose 4/22: INR 2.2>3>4 (after larger 12.5mg  dose 4/20). Hgb down to 8.8. Plts WNL.   Goal of Therapy:  INR 2-3 Monitor platelets by anticoagulation protocol: Yes   Plan:  - Hold Coumadin today again - Daily INR, CBC Q72H   Donnae Michels S. Alford Highland, PharmD, BCPS Clinical Staff Pharmacist [email or web address] Alford Highland, [place] 07/08/2019,11:24 AM

## 2019-07-09 ENCOUNTER — Inpatient Hospital Stay (HOSPITAL_COMMUNITY): Payer: No Typology Code available for payment source

## 2019-07-09 DIAGNOSIS — J9601 Acute respiratory failure with hypoxia: Secondary | ICD-10-CM | POA: Diagnosis not present

## 2019-07-09 DIAGNOSIS — N179 Acute kidney failure, unspecified: Secondary | ICD-10-CM | POA: Diagnosis not present

## 2019-07-09 LAB — BASIC METABOLIC PANEL
Anion gap: 17 — ABNORMAL HIGH (ref 5–15)
BUN: 57 mg/dL — ABNORMAL HIGH (ref 6–20)
CO2: 24 mmol/L (ref 22–32)
Calcium: 10.1 mg/dL (ref 8.9–10.3)
Chloride: 96 mmol/L — ABNORMAL LOW (ref 98–111)
Creatinine, Ser: 3.99 mg/dL — ABNORMAL HIGH (ref 0.61–1.24)
GFR calc Af Amer: 21 mL/min — ABNORMAL LOW (ref >60)
GFR calc non Af Amer: 18 mL/min — ABNORMAL LOW (ref >60)
Glucose, Bld: 123 mg/dL — ABNORMAL HIGH (ref 70–99)
Potassium: 3.7 mmol/L (ref 3.5–5.1)
Sodium: 137 mmol/L (ref 135–145)

## 2019-07-09 LAB — CBC WITH DIFFERENTIAL/PLATELET
Abs Immature Granulocytes: 0.06 10*3/uL (ref 0.00–0.07)
Basophils Absolute: 0.1 10*3/uL (ref 0.0–0.1)
Basophils Relative: 0 %
Eosinophils Absolute: 0 10*3/uL (ref 0.0–0.5)
Eosinophils Relative: 0 %
HCT: 35 % — ABNORMAL LOW (ref 39.0–52.0)
Hemoglobin: 10.5 g/dL — ABNORMAL LOW (ref 13.0–17.0)
Immature Granulocytes: 0 %
Lymphocytes Relative: 6 %
Lymphs Abs: 0.9 10*3/uL (ref 0.7–4.0)
MCH: 29.1 pg (ref 26.0–34.0)
MCHC: 30 g/dL (ref 30.0–36.0)
MCV: 97 fL (ref 80.0–100.0)
Monocytes Absolute: 0.4 10*3/uL (ref 0.1–1.0)
Monocytes Relative: 3 %
Neutro Abs: 12.8 10*3/uL — ABNORMAL HIGH (ref 1.7–7.7)
Neutrophils Relative %: 91 %
Platelets: 410 10*3/uL — ABNORMAL HIGH (ref 150–400)
RBC: 3.61 MIL/uL — ABNORMAL LOW (ref 4.22–5.81)
RDW: 17.3 % — ABNORMAL HIGH (ref 11.5–15.5)
WBC: 14.3 10*3/uL — ABNORMAL HIGH (ref 4.0–10.5)
nRBC: 0 % (ref 0.0–0.2)

## 2019-07-09 LAB — GLUCOSE, CAPILLARY
Glucose-Capillary: 106 mg/dL — ABNORMAL HIGH (ref 70–99)
Glucose-Capillary: 174 mg/dL — ABNORMAL HIGH (ref 70–99)
Glucose-Capillary: 200 mg/dL — ABNORMAL HIGH (ref 70–99)
Glucose-Capillary: 279 mg/dL — ABNORMAL HIGH (ref 70–99)
Glucose-Capillary: 282 mg/dL — ABNORMAL HIGH (ref 70–99)
Glucose-Capillary: 309 mg/dL — ABNORMAL HIGH (ref 70–99)
Glucose-Capillary: 364 mg/dL — ABNORMAL HIGH (ref 70–99)

## 2019-07-09 LAB — IRON AND TIBC
Iron: 39 ug/dL — ABNORMAL LOW (ref 45–182)
Saturation Ratios: 11 % — ABNORMAL LOW (ref 17.9–39.5)
TIBC: 364 ug/dL (ref 250–450)
UIBC: 325 ug/dL

## 2019-07-09 LAB — FERRITIN: Ferritin: 336 ng/mL (ref 24–336)

## 2019-07-09 LAB — PROTIME-INR
INR: 3.1 — ABNORMAL HIGH (ref 0.8–1.2)
Prothrombin Time: 31.7 seconds — ABNORMAL HIGH (ref 11.4–15.2)

## 2019-07-09 MED ORDER — ALPRAZOLAM 0.25 MG PO TABS
0.2500 mg | ORAL_TABLET | Freq: Two times a day (BID) | ORAL | Status: DC | PRN
  Administered 2019-07-09: 16:00:00 0.25 mg
  Filled 2019-07-09: qty 1

## 2019-07-09 MED ORDER — SODIUM CHLORIDE 0.9 % IV SOLN
500.0000 mg | INTRAVENOUS | Status: DC
  Administered 2019-07-09: 500 mg via INTRAVENOUS
  Filled 2019-07-09: qty 0.5

## 2019-07-09 MED ORDER — LABETALOL HCL 5 MG/ML IV SOLN
5.0000 mg | INTRAVENOUS | Status: DC | PRN
  Administered 2019-07-09: 5 mg via INTRAVENOUS
  Filled 2019-07-09: qty 4

## 2019-07-09 MED ORDER — VANCOMYCIN HCL 2000 MG/400ML IV SOLN
2000.0000 mg | Freq: Once | INTRAVENOUS | Status: AC
  Administered 2019-07-09: 22:00:00 2000 mg via INTRAVENOUS
  Filled 2019-07-09: qty 400

## 2019-07-09 MED ORDER — VANCOMYCIN HCL IN DEXTROSE 1-5 GM/200ML-% IV SOLN: 1000.0000 mg | INTRAVENOUS | Status: DC

## 2019-07-09 MED ORDER — WARFARIN SODIUM 7.5 MG PO TABS
7.5000 mg | ORAL_TABLET | Freq: Once | ORAL | Status: AC
  Administered 2019-07-09: 7.5 mg via ORAL
  Filled 2019-07-09: qty 1

## 2019-07-10 MED FILL — Medication: Qty: 1 | Status: AC

## 2019-07-10 NOTE — Discharge Summary (Addendum)
Death Summary  Carlos Brewer. VOZ:[id number] DOB: August 21, 1985 DOA: 05-11-19  PCP: Patient, No Pcp Per  Admit date: 05-11-19 Date of Death: 07-28-19 Time of Death: May 29, 2353 Notification: Patient, No Pcp Per notified of death of Jul 31, 2019   History of present illness:  The patient was a 34 yo smoker who was found to have slurred speech and right sided weakness. He was initially admitted on 2022-05-10 with a left MCA stroke with multiple smaller embolic infarcts noted. Patient did receive tPA and thrombectomy. He was found to have a left ventricular thrombus. His earlier course was complicated by septic shock from HCAP, renal failure requiring dialysis, VT, and cardiogenic shock. After a stay in the ICU, the patient was ultimately transferred to [facility] on 07/03/19  Final Diagnoses:  Principal Problem:   Acute CVA (cerebrovascular accident) ([place]) s/p tPA and thrombectomy due to M-2 LVO Active Problems:   Systolic and diastolic CHF, chronic (HCC)   Morbid obesity ([place])   Obesity (BMI 30-39.9)   OSA on CPAP   Occlusion of left middle cerebral artery   Middle cerebral artery embolism, left   Cerebrovascular accident (CVA) due to thrombosis of left middle cerebral artery ([place])   Shock ([place])   Shock liver   Respiratory failure (HCC)   Pressure injury of skin   Tracheostomy tube present ([place])   Acute kidney injury ([place])   Endotracheal tube present   Complication of tracheostomy tube ([place])   Acute hypoxemic respiratory failure (HCC)   Fever   Type 2 diabetes mellitus with hyperlipidemia (HCC)   Hypoxia   HFrEF (heart failure with reduced ejection fraction) ([place])   Pneumothorax   Pneumomediastinum ([place])   Hemoptysis   Palliative care by specialist   Acute renal failure ([place])   Received tissue plasminogen activator (t-PA) less than 24 hours prior to arrival   HTN (hypertension)   Cocaine abuse ([place])   Tachycardia   Hypokalemia   Grade III diastolic dysfunction   Pleural  effusion on left   MRSA carrier   Hyperkalemia   Hypocalcemia   Proteinuria   Lactic acidosis   Dysarthria due to acute cerebrovascular accident (CVA) ([place])   Acute right hemiparesis (HCC)   LBBB (left bundle branch block)   ATN (acute tubular necrosis) (HCC)   Hematuria, gross   Coagulopathy (HCC)   Decreased fibrinogen (HCC)   Hyperammonemia (HCC)   Encounter for continuous renal replacement therapy (CRRT) for acute renal failure (HCC)   Acute metabolic encephalopathy   Thrombocytopenia (HCC)   Tobacco abuse   Acute thrombus of left ventricle (HCC)   Polymorphic ventricular tachycardia (HCC)   Severe sepsis with septic shock (HCC)   Bigeminy   Embolic stroke [facility]): Multiple   Prolonged QT interval   HIT (heparin-induced thrombocytopenia) (HCC)   Hypercatabolic hypoproteinemia   Atrial fibrillation with RVR (HCC)   Anemia of infection and chronic disease   Muscular deconditioning   Aphasia due to acute stroke ([place])   Dysphasia due to recent stroke   Aphonia   Aspiration pneumonia ([place])   Hypernatremia   Nystagmus   Encephalomalacia with cerebral infarction ([place])   Projectile vomiting   Pressure injury due to medical device   Pressure injury of deep tissue of heel   Neurogenic hypoventilation   Hypoglycemia due to insulin   PEG (percutaneous endoscopic gastrostomy) status (HCC)   Cheyne-Stokes breathing disorder  Summary of hospital course: Hospital course: 34 YO M with a PMH of OSA on CPAP, morbid obesity,  polysubstance abuse including alcohol and cocaine, myocarditis in 2018 and cardiomyopathy (from ETOH/cocaine) resulting in chronic systolic and diastolic CHF and diabetes mellitus with hyperlipidemia who was admitted by neurology on 04/21/19 with a large vessel M-2 occlusion/acute CVA with evolution requiring emergent tPA and embolectomy.  He went into shock post-procedure with the need to initiate pressor support (ultimately maxing out on neosynephrine/norepinephrine),  stress dose steroids, and ventilatory support with increasing oxygen needs.  He had multiple metabolic derangements including significant hyperkalemia (treated with D50/insulin/albuterol/bicarb), lactic acidosis, leukocytosis, AKI, and significant LFT elevation/shock liver all within 24 hours of presentation.  Temperature spiked to 103.2 (noted to be a MRSA carrier)-so empiric Vancomycin/Zosyn initiated with urine/sputum/blood cultures sent.  The remainder of his stroke work up completed with a 2 D echo which showed an EF of less than 20% with grade III diastolic dysfunction, and LDL of 203 indicating uncontrolled hyperlipidemia, and a Hgb A1c of 6.5%. CXR showed bilateral infiltrates concerning for ARDS versus cardiogenic pulmonary edema.  Troponin screen revealed a significant elevation of 16,274 so cardiology was consulted. The cardiologist recommended diuresis and discontinuation of statin therapy given his acute liver injury. His NSTEMI was not treated with ASA due to concerns for DIC as the patient was coagulopathic with low fibrinogen in the setting of shock, and the troponin elevation was felt to be due to demand ischemia in the setting of multi-organ failure/sepsis. Nephrology was consulted on 04/23/19 due to AKI thought to be from ATN due to shock, complicated by the use of contrast.  Noted to have hematuria/clots as well.  Nephrology recommended attempting to diurese and a HD catheter was placed.  Gastroenterology was also consulted given marked elevation of LFTs. It was felt that LFT elevation from passive congestion of liver from CHF and shock liver with possible rhabdomyolysis contributory and imaging with an abdominal ultrasound (unremarkable) was recommended. Despite aggressive attempts to diurese, the patient became more acidotic and hyperkalemic so CRRT was initiated. Given concerns for underlying septic shock, antibiotics were broadened to meropenem/vanc on 04/23/19. Source felt to be from UTI versus  PNA and he completed 7 days of meropenem/vanc. Cultures were negative. On 04/25/19, the cardiologist ordered a limited echo to which confirmed an apical thrombus and he was placed on a heparin gtt. Additionally, TF were initiated given ongoing NPO status. The patient also was started on amiodarone due to persistent wide complex tachycardia. By 04/27/19, the patient continued to be unresponsive raising concerns of anoxic damage. By 04/28/19, pressors were able to be weaned but there were still no signs of renal recovery and Precedex had to be initiated due to agitation (biting ETT) and hypoxia. Ultimately, pressors had be be resumed later in the day. An MRI was able to be obtained which showed multiple embolic strokes as well as a large right cerebellar infarct.  Prognosis was felt to be poor by the neurologist. An electrophysiologist evaluated the patient on 04/30/19 due to prolonged QT, frequent PVCs and nonsustained polymorphic VT.  He was felt to be at high risk for fatal arrhythmia, but unfortunately, he was not felt to be a candidate for EP interventions. Again, he was felt to have a poor prognosis by the electrophysiologist/nephrologist/pulmonologist, however family wished to continue with aggressive care. On 05/02/19, IV heparin was placed on hold due to worsening thrombocytopenia.  A HIT panel was sent (resulted positive) and the patient was started on angiomax. On 05/03/19, pressors were, once again, weaned and SBTs initiated. On 05/05/19, a tracheostomy was placed.  On 05/05/19, a diagnostic evaluation of the airways/bronchoscopy was performed. After a break in CRRT x 12 hours, the patient was noted to have recurrent hyperkalemia with a potassium of 7.3.  An emergent dialysis catheter was placed and CVVHD was resumed. Empiric zosyn/vanc was initiated due to concerns for line sepsis versus HCAP after he spiked a fever and was noted to have leukocytosis. On 05/07/19, went into afib w/ RVR while on Levophed so this was  d/c'd and the patient was started on vasopressin then went into a junctional wide complex tachycardia which was treated with an IV amiodarone bolus. Pressors were again weaned by 05/08/19. The heart failure team tried to slow his heart rate with adenosine on 05/09/19, but this did not have any lasting effect. Recommended low dose ivabradine with continued amiodarone. Completed course of Vancomycin with Zosyn discontinued on 05/10/19. Norepinephrine resumed for recurrent hypotension. Developed diarrhea on 05/12/19 and C. diff checked (negative). Blood cultres re-sent due to persistent leukocytosis. Speech therapy began working with the patient with a PMV but no phonation noted, though more interactive and nodding his head. Was able to wean off ventilator to trach collar by 05/13/19. On 05/14/19, noted to be back in afib/flutter. Remained on pressors/milrinone. On 05/16/19, Ivabradine stopped. ST noted that the patient had oropharyngeal dysphagia and was at high risk of aspiration and recommended ongoing NPO status. Permacath placed 05/17/19. On 05/18/19, amio changed to per tube route and PT/OT began to work with the patient. Noted to be making urine with plans to transition to iHD if needed. On 05/19/19, trach downsized from #6 to #4 Shiley. FEES performed, showing ongoing dysphagia and mild aspiration risk with recommendations to initiate a dysphagia II diet. Unfortunately, patient developed hypoxia and respiratory distress, and emergent bronchoscopy showed decannulation of trach.  Reintubated and #6 Shiley trach replaced. Attempted to wean back to [facility] on 05/20/19 but unable to tolerate. PCXR showed a marked increase in bilateral pulmonary edema. Bronchoscopy repeated and brushings sent for analysis. CVVHD resumed. On 05/21/19, pressors had to be restarted so the patient could tolerated CVVHD. CVVHD discontinued on 05/22/19 and iHD planned. Received feraheme on 05/24/19 to address anemia of chronic disease. On 05/25/19 had first iHD  session. Also spiked a fever of 101.7. Cortrak dislodged as well, so it was removed and replaced with an NG tube. Due to worsening fever/recurrent leukocytosis, repeat CXR showed a LLL infiltrate and the patient was started on Rocephin. Cortrak replaced 05/27/19. One of 4 blood cultures were noted to be positive for GPC so Rocephin dose increased to 2 grams IV Q 24 hours. Respiratory cultures grew candida albicans, not felt to be significant. Ultimately, speciation showed this was a likely contaminant (coagulase negative staph). CXR showed worsening LLL infiltrate over time, felt to be from ongoing aspiration. On 05/29/19, Coumadin initiated and bivalirudin maintained pending therapeutic INR. On 05/31/19, patient noted to have nystagmus and possible neuro changes. Bilateral upper and lower extremity dopplers completed (negative). Continuing to spike high fevers up to 106, so a cooling blanket was utilized to address. Cefepime, Vanc, and Eraxis empirically started. New lines placed. Shock noted requiring re-initiation of pressor support. Due to concerns for ICH, CT of the head was obtained (New area of hypoattenuation in the left pons, left frontal encephalomalacia related to old infarct), no neurology re-consulted and recommended repeat MRI when medically stable. On 06/02/19, noted to have an episode of projectile vomiting with dislodgement of Cortrak. Given agitation on the vent, placed on Precedex drip. Cortrak  replaced on 06/03/19. Cardiology reconsulted after patient went back into bigeminy with poor peripheral perfusion and dopplers showing peripheral pulses in the 30's. Had been transitioned to dopamine and cardiologist recommended resuming norepinephrine.  Completed antibiotics on 06/06/19. On 06/08/19, patient noted to have increased WOB, increased secretions necessitating resuming ventilator support. Repeat CXR showed new multifocal infiltrates concerning for asymmetric edema. Nephrology attempted to diurese to  address and was able to be weaned back to [facility]. Patient stabilized enough to move out of ICU and transition care to the hospitalists on 06/11/19.  Later in the day, noted to have dislodged the Cortrak again, so it was removed. The patient's mother agreed to placement of a PEG tube. Patient noted to have cyclic desaturation to the 70's with tracheostomy change to #5, and bronchoscopy done at bedside on 06/13/19. tracheostomy was replaced with 6 cuffless inserted over bronchoscope. On 06/14/19, noted to have increased WOB again. CXR showed very small biapical pneumothoraces and diffuse pneumomediastinum with subcutaneous gas dissecting into the supraclavicular regions with no evidence of tension. Supportive care advised by pulmonology. On 06/15/19, patient had recurrent fever to 103.1.  Blood cultures/tracheal aspirate cultures sent and empiric Cefepime/Vanc initiated. Continued to have episodes of increased WOB/desaturation, with pulmonology opining that the patient may have an element of neurogenic hypoventilation. Repeat CXR and CT of the head and chest obtained. No major new findings. Transferred back to the ICU to reinitiate vent support. On 06/16/19, CRRT re-initiated due to hypotension. Levophed resumed 06/17/19. Cortrak once again dislodged and removed resulting in an episode of hypoglycemia. Despite ongoing palliative care discussions with the patient's mother, and reiteration of his poor prognosis, she continued to request aggressive care. On 06/20/19, a formal palliative care consultation was performed. Again, the patient's mother opted for full code status/full scope of treatment and proceeding with PEG placement. On 06/22/19, pressors weaned off and once again, tolerating TC.  PEG placed by IR on 06/23/19. Patient transferred out of the ICU on 06/24/19. On 06/25/19, had increased WOB and CXR showed increased pulmonary edema. Nephrology performed ultrafiltration. Began spiking fevers again. On 06/26/19, unable to tolerate HD  due to hypoxia. Transferred back to the ICU and CRRT re-initiated. Pulmonologist indicated the recurrent pattern of Cheyne stokes breathing resulting in desaturations (from CHF and stroke) could only be addressed by heavy sedation. Vancomycin/Meropenem initiated on 06/27/19. Blood cultures re-sent. Midodrine resumed for hypotension. On 06/28/19, intermittent junctional bradycardia noted so amio discontinued. On 06/29/19, cuff downsized to #6 Shiley. A flexiseal was placed for diarrhea causing skin breakdown. On 06/30/19, patient dislodged PEG and was biting IV lines and had to be restrained for safety. NG placed but patient pulled out NG tube x 2. The hospitalists resumed care on 07/03/19. PEG replaced 07/04/19. Patient appeared to be stabilizing and plans were made to possibly transfer to [place].  On 07/08/19, patient had an aspiration event when undergoing a MBS and also was noted to have recurrent hypotension and an increase in his MEWS score. On 07/22/19, patient was noted to be tachypneic and tachycardic and had recurrent leukocytosis on labs.  Meropenem was re-initiated, with aspiration pneumonia felt to be the likely underlying cause of recurrent sepsis. Mother noted to be at the bedside earlier, feeding him thickened liquids and he was noted to have increased coughing.  Heart rate was in the 120s and he was given 5 mg of labetalol.  Became hypotensive with systolics in the 33'X with sweating and hyperventilating.  Rapid response evaluated patient,  he was noted to have a respiratory rate in the 20s and oxygen 94% on trach collar 28% FiO2.  Several hours later he came pulseless and unresponsive.  CODE BLUE was called, patient received approximately epi x7, 2 A of bicarb.  ROSC was achieved after 27 minutes of CPR with weak pulse and bradycardia.  En route to the ICU patient coded again.  He was given approximately milligrams epinephrine with bicarb, magnesium and calcium, no shockable rhythm was  obtained.  Bedside ultrasound did not reveal obvious pneumothorax.  After additional 18 minutes of CPR there was no sign of cardiac activity on ultrasound and he remained pulseless.  Family at the bedside.  Time of death was called at 9.  In summary, this patient presented with multiple embolic strokes resulting in significant neurological deficits including dysphagia/aphasia and severe sepsis/septic shock within 24 hours of admission, likely from aspiration pneumonia.  He had recurrent issues with sepsis requiring multiple bouts of antibiotics, felt to be from recurrent aspiration events as he was tube feeding dependent with an inability to protect his airway.  Given his underlying severe cardiomyopathy, he also had recurrent severe stress on his cardiac status, and ultimately was unable to tolerate the physiological stress of recurrent pneumonia.  He also experienced AKI from ATN with no recovery of renal function, and was HD dependent, adding to physiological stress.  Despite his known poor prognosis, family wanted aggressive care.   Acute on chronic respiratory failure with recurrent sepsis with pneumonia Tracheostomy with intermittent bleeding The patient was ultimately continued on trach collar at 28% FiO2 with pulmonary hygiene See below. After transfer out of ICU, patient initially remained stable, however later developed progressively worsening shortness of breath and markedly worse leukocytosis Patient later again met sepsis criteria with increased tachypnea and tachycardia with chest xray findings noting continued interstitial lung infiltrates Of note, nursing documented that family was feeding patient diet not compatible with diet recommendations with increased shortness of breath following. Did resume IV vancomycin and meropenem for broad coverage, blood cultures were ordered On the evening of 4/24, rapid response was contacted for progressively worse tachypnea. Later in the evening,  patient was found unresponsive and pulseless. Code Blue was called and despite heroric measures, patient was ultimately pronounced at [id number]  End-stage cardiomyopathy/chronic systolic HF/polymorphic VT/LV thrombus Followed by cardiology who signed off on 4/3 as patient is not a candidate for advanced heart failure therapies Continue hydralazine, midodrine Patient was continued on coumadin per pharmacy dosing  End-stage renal failure Multifactorial including ATN following cardiogenic/septic shock Anuric without evidence of renal recovery Status post CRRT which was stopped on 4/15 Patient had been continued with scheduled HD per Nephrology  Diabetes mellitus type 2 SSI, Lantus, Accu-Cheks, hypoglycemic protocol Glycemic trends had appeared relatively stable  HIT Continued Angiomax  CVA on admission Continued with ASA, Lipitor as tolerated  Radiopaque density in abdomen Suspicion for ingested foreign body Serial KUBs were reviewed, ultimately with clearance of metallic foreign body  ICU delirium/intermittent agitation Noted to keep pulling out NG tube, PEG tube earlier this admit, prompted restraints PEG tube was replaced by IR on 07/04/2019 Patient was continued on Seroquel as tolerated Patient was ultimately able to follow commands  Nutrition/dysphagia PEG tube replaced by IR on 07/04/2019 Patient was continued with nutrition as tolerated via tube feeding See above, patient was later cleared for dysphagia 3 diet with nectar thick liquids. Nursing staff noted family feeding item not compatible with recommended diet order  Goals of care  Poor long term prognosis had been earlier established per Palliative Care meetings Palliative care on board with full scope of treatment ultimately determined per patient and family's wishes Per Palliative Care, family aware patient remains a high risk for worsening infections, wounds, and further decline  Recurrent sepsis with  Pneumonia During patient's prolonged stay, he was treated for septic shock secondary to PNA Antibiotic course (vancomycin and meropenem) completed on 4/14 Later, patient was noted to have progressively worsening tachycardia, tachypnea, and new leukocytosis 14k, neutrophil predominate Blood cultures were ordered Repeat CXR showed findings consistent with stable airspace disease Given concerns of worsening sepsis, broad spectrum antibiotics (meropenem and vancomycin) were continued Despite aggressive efforts, patient continued to decline as per above.   The results of significant diagnostics from this hospitalization (including imaging, microbiology, ancillary and laboratory) are listed below for reference.    Significant Diagnostic Studies: DG Chest 1 View  Result Date: 06/13/2019 CLINICAL DATA:  Tracheomegaly. Congestive heart failure. EXAM: CHEST  1 VIEW COMPARISON:  Radiographs 06/11/2019 and 06/08/2019. CT 12/13/2018. FINDINGS: 1652 hours. Right IJ dialysis catheter and tracheostomy appear unchanged. The feeding tube has been advanced, projecting below the diaphragm, tip not visualized. There is stable cardiac enlargement. Diffuse bilateral airspace opacities have not significantly changed. There is no pneumothorax or significant pleural effusion. The bones appear unchanged. IMPRESSION: Interval advancement of the feeding tube. Otherwise stable chest with diffuse bilateral airspace opacities consistent with congestive heart failure. Electronically Signed   By: Richardean Sale M.D.   On: 06/13/2019 16:59   DG Abd 1 View  Result Date: 07/08/2019 CLINICAL DATA:  Question foreign body. EXAM: ABDOMEN - 1 VIEW COMPARISON:  07/03/2019. FINDINGS: Gastrostomy tube noted with tip over the left upper abdomen. Contrast noted in the proximal small bowel. Previously identified metallic density noted over the left colon no longer identified. The left upper abdomen however is incompletely imaged. No acute  bony abnormality. IMPRESSION: 1. Gastrostomy tube noted with tip over the left upper abdomen. Oral contrast noted in the proximal small bowel. 2. Left upper abdomen is incompletely imaged. No metallic foreign body identified. Previously identified metallic density noted on abdomen of 07/03/2019 no longer identified. Electronically Signed   By: Marcello Moores  Register   On: 07/08/2019 11:43   DG Abd 1 View  Result Date: 07/03/2019 CLINICAL DATA:  Nasogastric tube placement EXAM: ABDOMEN - 1 VIEW COMPARISON:  1 day prior FINDINGS: Nasogastric tube terminates at the distal stomach. Bibasilar airspace disease is suboptimally evaluated. The previously described linear density projects over the left mid abdomen. IMPRESSION: Nasogastric tube terminating at the distal stomach. Radiopaque density currently projecting in the left mid abdomen. This may be within the descending colon, given position on previous radiographs. Electronically Signed   By: Abigail Miyamoto M.D.   On: 07/03/2019 10:47   DG Abd 1 View  Result Date: 07/02/2019 CLINICAL DATA:  Enteric tube placement EXAM: ABDOMEN - 1 VIEW COMPARISON:  Abdominal radiograph from earlier today FINDINGS: Enteric tube terminates in the distal stomach. Linear 2 cm metallic structure overlies the medial left abdomen at the L2 level, which overlied the right abdomen on the previous abdominal radiograph from this morning. No dilated small bowel loops. Rectal catheter in place. No significant colonic stool. No evidence of pneumatosis or pneumoperitoneum. No radiopaque nephrolithiasis. IMPRESSION: 1. Enteric tube terminates in the distal stomach. 2. Nonobstructive bowel gas pattern. 3. Linear 2 cm metallic structure overlies the medial left abdomen at the L2 level, which overlied the right abdomen on  comparison abdominal radiograph from this morning. Uncertain if this is an external structure versus radiopaque abdominal foreign body. Suggest patient examination for any overlying  structures. CT of the abdomen and pelvis may be obtained for further evaluation as clinically warranted. These results will be called to the ordering clinician or representative by the Radiologist Assistant, and communication documented in the PACS or [employer]. Electronically Signed   By: Ilona Sorrel M.D.   On: 07/02/2019 16:35   DG Abd 1 View  Result Date: 07/02/2019 CLINICAL DATA:  Nasogastric tube placement. EXAM: ABDOMEN - 1 VIEW COMPARISON:  Earlier today. FINDINGS: Interval nasogastric 2 with its tip and side hole in the proximal stomach. Normal bowel gas pattern. Stable linear density overlying the hepatic flexure region of the colon. Unremarkable bones. IMPRESSION: 1. Nasogastric tube tip and side hole in the proximal stomach. 2. Stable possible linear metallic foreign body in the hepatic flexure region of the right colon. Electronically Signed   By: Claudie Revering M.D.   On: 07/02/2019 13:23   DG Abd 1 View  Result Date: 07/02/2019 CLINICAL DATA:  Peg replacement with Foley catheter. EXAM: ABDOMEN - 1 VIEW COMPARISON:  None. FINDINGS: Linear radiodense foreign body overlying the RIGHT upper quadrant, overlying the hepatic flexure of the RIGHT colon, measuring 2.6 cm in length, of uncertain etiology. No tubing is seen over the stomach. No dilated large or small bowel loops. No evidence of free intraperitoneal air. Lung bases appear clear. IMPRESSION: 1. New linear radiodense foreign body overlying the RIGHT upper quadrant, measuring 2.6 cm in length, overlying the hepatic flexure of the RIGHT colon. This is of uncertain etiology, possibly a foreign body within the colon. 2. No tubing is seen over the stomach. 3. Nonobstructive bowel gas pattern. These results will be called to the ordering clinician or representative by the Radiologist Assistant, and communication documented in the PACS or [employer]. Electronically Signed   By: Franki Cabot M.D.   On: 07/02/2019 10:31   CT HEAD WO  CONTRAST  Result Date: 06/15/2019 CLINICAL DATA:  Encephalopathy previous infarction EXAM: CT HEAD WITHOUT CONTRAST TECHNIQUE: Contiguous axial images were obtained from the base of the skull through the vertex without intravenous contrast. COMPARISON:  CT brain, 06/01/2019, MR brain, 04/28/2019 FINDINGS: Brain: Interval evolution of subacute to chronic bilateral frontal lobe infarctions primarily involving the operculum (series 3, image 21) as well as a large infarction of the right cerebellar hemisphere (series 3, image 11), seen acutely on prior MR dated 04/28/2019. There has also been interval evolution of infarction of the central and left pons (series 3, image 12). No new infarction or acute intracranial finding appreciated. Vascular: No hyperdense vessel or unexpected calcification. Skull: Normal. Negative for fracture or focal lesion. Sinuses/Orbits: No acute finding. Other: None. IMPRESSION: Expected interval evolution of subacute to chronic infarctions of the frontal lobes, right cerebellar hemisphere, and pons. No noncontrast CT evidence of new infarction or other acute intracranial pathology. Electronically Signed   By: Eddie Candle M.D.   On: 06/15/2019 11:38   CT CHEST WO CONTRAST  Result Date: 06/15/2019 CLINICAL DATA:  Pneumothorax, pneumomediastinum, abnormal chest radiograph EXAM: CT CHEST WITHOUT CONTRAST TECHNIQUE: Multidetector CT imaging of the chest was performed following the standard protocol without IV contrast. COMPARISON:  Chest radiograph, 06/15/2019, 10:23 a.m. FINDINGS: Cardiovascular: Large-bore right neck multi lumen vascular catheter, tip near the superior cavoatrial junction. Cardiomegaly. No pericardial effusion. Mediastinum/Nodes: No enlarged mediastinal, hilar, or axillary lymph nodes. Extensive pneumomediastinum and subcutaneous  emphysema in the lower neck. Thyroid gland, trachea, and esophagus demonstrate no significant findings. Lungs/Pleura: Tracheostomy. Extensive  heterogeneous and consolidative airspace disease, worse in the right lung. Minimal, less than 5% bilateral pneumothoraces (series 3, image 79). Upper Abdomen: No acute abnormality. Musculoskeletal: No chest wall mass or suspicious bone lesions identified. IMPRESSION: 1. Extensive pneumomediastinum and subcutaneous emphysema in the lower neck. 2.  Minimal, less than 5% bilateral pneumothoraces. 3. Extensive heterogeneous and consolidative airspace disease, worse in the right lung, consistent with multifocal infection, edema, and/or ARDS. 4.  Tracheostomy. 5.  Cardiomegaly. Electronically Signed   By: Eddie Candle M.D.   On: 06/15/2019 11:43   IR GASTROSTOMY TUBE MOD SED  Result Date: 06/23/2019 INDICATION: 34 year old male with a history of dysphagia EXAM: PERC PLACEMENT GASTROSTOMY MEDICATIONS: 2 g Ancef; Antibiotics were administered within 1 hour of the procedure. Scratch ANESTHESIA/SEDATION: Versed 1.0 mg IV; Fentanyl 25 mcg IV Moderate Sedation Time:  16 minutes The patient was continuously monitored during the procedure by the interventional radiology nurse under my direct supervision. CONTRAST:  53m OMNIPAQUE IOHEXOL 300 MG/ML SOLN - administered into the gastric lumen. FLUOROSCOPY TIME:  Fluoroscopy Time: 7 minutes 24 seconds (150 mGy). COMPLICATIONS: None PROCEDURE: Informed written consent was obtained from the patient and the patient's family after a thorough discussion of the procedural risks, benefits and alternatives. All questions were addressed. Maximal Sterile Barrier Technique was utilized including caps, mask, sterile gowns, sterile gloves, sterile drape, hand hygiene and skin antiseptic. A timeout was performed prior to the initiation of the procedure. The epigastrium was prepped with Betadine in a sterile fashion, and a sterile drape was applied covering the operative field. A sterile gown and sterile gloves were used for the procedure. A 5-French orogastric tube is placed under fluoroscopic  guidance. Scout imaging of the abdomen confirms barium within the transverse colon. The stomach was distended with gas. Under fluoroscopic guidance, an 18 gauge needle was utilized to puncture the anterior wall of the body of the stomach. An Amplatz wire was advanced through the needle passing a T fastener into the lumen of the stomach. The T fastener was secured for gastropexy. A 9-French sheath was inserted. A snare was advanced through the 9-French sheath. A BBritta Mccreedywas advanced through the orogastric tube. It was snared then pulled out the oral cavity, pulling the snare, as well. The leading edge of the gastrostomy was attached to the snare. It was then pulled down the esophagus and out the percutaneous site. Tube secured in place. Contrast was injected. Patient tolerated the procedure well and remained hemodynamically stable throughout. No complications were encountered and no significant blood loss encountered. IMPRESSION: Status post fluoroscopic placed percutaneous gastrostomy tube, with 20 [place]through. Signed, JDulcy Fanny WEarleen Newport DO [facility] Electronically Signed   By: JCorrie MckusickD.O.   On: 06/23/2019 17:14   IR Replc Gastro/Colonic Tube Percut W/Fluoro  Result Date: 07/04/2019 INDICATION: History dysphagia, post percutaneous gastrostomy tube placement 06/23/2019 Patient with altered mental status and removed the gastrostomy tube. Gastrostomy track has been maintained with placement of a Foley catheter. Patient presents now for definitive fluoroscopic guided exchange and replacement EXAM: FLUOROSCOPIC GUIDED REPLACEMENT OF GASTROSTOMY TUBE COMPARISON:  Image guided gastrostomy tube placement-06/23/2019 MEDICATIONS: None. CONTRAST:  11mOMNIPAQUE IOHEXOL 300 MG/ML SOLN administered into the gastric lumen FLUOROSCOPY TIME:  18 seconds (1.4 mGy) COMPLICATIONS: None immediate. PROCEDURE: Patient was positioned supine on the fluoroscopy  table The existing Foley catheter, utilized to maintain  the patency of the gastrostomy track, was injected with a small amount of contrast demonstrating appropriate position functionality The external portion of the Foley catheter was cut and removed intact. Next, a 85 French balloon retention gastrostomy tube was inserted via the chronic gastrostomy tube track, insufflated with approximately 10 cc of saline and dilute contrast and pulled against the anterior inner aspect of the gastric lumen. The external disc was cinched. Contrast injection demonstrated appropriate position functionality of the new balloon retention gastrostomy tube. A dressing was applied. The patient tolerated the procedure well without immediate postprocedural complication. IMPRESSION: Successful fluoroscopic guided replacement of a new 18-French gastrostomy tube. The new gastrostomy tube is ready for immediate use. Electronically Signed   By: Sandi Mariscal M.D.   On: 07/04/2019 14:03   DG ABDOMEN PEG TUBE LOCATION  Result Date: 07/01/2019 CLINICAL DATA:  PEG EXAM: ABDOMEN - 1 VIEW COMPARISON:  None. FINDINGS: General paucity of bowel gas. There is contrast injection of a percutaneous gastrostomy tube, with intraluminal contrast identified in stomach. There is no evident extraluminal contrast on single supine radiograph. Cardiomegaly in the lower chest. IMPRESSION: There is contrast injection of a percutaneous gastrostomy tube, with intraluminal contrast identified in stomach. There is no evident extraluminal contrast on single supine radiograph. Electronically Signed   By: Eddie Candle M.D.   On: 07/01/2019 19:05   DG CHEST PORT 1 VIEW  Result Date: 2019/08/02 CLINICAL DATA:  Pneumonia EXAM: PORTABLE CHEST 1 VIEW COMPARISON:  06/30/2019 FINDINGS: No significant change in AP portable examination with diffuse bilateral interstitial and heterogeneous airspace opacity. Tracheostomy. Cardiomegaly. Large-bore right neck vascular catheter.  IMPRESSION: 1. No significant change in AP portable examination with diffuse bilateral interstitial and heterogeneous airspace opacity. Tracheostomy. 2.  Cardiomegaly. Electronically Signed   By: Eddie Candle M.D.   On: 08/02/2019 13:07   DG Chest Port 1 View  Result Date: 06/30/2019 CLINICAL DATA:  Acute respiratory failure. EXAM: PORTABLE CHEST 1 VIEW COMPARISON:  06/26/2019. FINDINGS: Tracheostomy tube and right central line stable position. Cardiomegaly again noted without interim change. Diffuse bilateral pulmonary infiltrates/edema again noted. Improved aeration from prior exam. No pleural effusion or pneumothorax. IMPRESSION: 1.  Tracheostomy tube and central line stable position. 2.  Stable cardiomegaly. 3. Diffuse bilateral pulmonary infiltrates/edema again noted. Improved aeration from prior exam. Electronically Signed   By: Marcello Moores  Register   On: 06/30/2019 06:51   DG Chest Port 1 View  Result Date: 06/25/2019 CLINICAL DATA:  Acute respiratory failure EXAM: PORTABLE CHEST 1 VIEW COMPARISON:  06/23/2019 FINDINGS: Tracheostomy tube and central venous line unchanged. Stable cardiac silhouette. There is diffuse airspace disease slight increased in density compared to prior. IMPRESSION: 1. Slight worsening of diffuse bilateral airspace disease. 2. Stable support apparatus. Electronically Signed   By: Suzy Bouchard M.D.   On: 06/25/2019 09:45   DG Chest Port 1 View  Result Date: 06/23/2019 CLINICAL DATA:  Acute respiratory failure. EXAM: PORTABLE CHEST 1 VIEW COMPARISON:  06/20/2019. FINDINGS: Interim removal of tracheostomy tube and placement of endotracheal tube. Endotracheal tube tip 2 cm above the lower most portion of the carina. NG tube, right IJ line in stable position. Severe cardiomegaly again noted. Diffuse bilateral pulmonary infiltrates/edema. No pleural effusion or pneumothorax. IMPRESSION: 1. Interim removal of tracheostomy tube and placement of an endotracheal tube. Endotracheal  tube tip 2 cm above the lower most portion of the carina. NG tube and right IJ line stable position. 2.  Severe cardiomegaly.  Again noted. 3.  Diffuse bilateral  pulmonary infiltrates/edema. Electronically Signed   By: Marcello Moores  Register   On: 06/23/2019 08:09   DG Chest Port 1 View  Result Date: 06/20/2019 CLINICAL DATA:  Respiratory failure, hypoxia EXAM: PORTABLE CHEST 1 VIEW COMPARISON:  06/17/2019 FINDINGS: Interval removal of enteric feeding tube and placement of esophagogastric tube, tip and side port below the diaphragm. Near complete interval resolution of previously noted pneumomediastinum. Tracheostomy. Unchanged cardiomegaly. Diffuse interstitial and heterogeneous airspace opacity, not significantly changed. IMPRESSION: 1. Interval removal of enteric feeding tube and placement of esophagogastric tube, tip and side port below the diaphragm. 2. Near complete interval resolution of previously noted pneumomediastinum. 3. Unchanged cardiomegaly and diffuse interstitial heterogeneous airspace opacity, consistent with edema, infection, and/or ARDS. No new airspace opacity. Electronically Signed   By: Eddie Candle M.D.   On: 06/20/2019 11:19   DG Chest Port 1 View  Result Date: 06/17/2019 CLINICAL DATA:  34 year old male with history of respiratory failure. EXAM: PORTABLE CHEST 1 VIEW COMPARISON:  Chest x-ray 06/16/2019. FINDINGS: A tracheostomy tube is in place with tip 4.0 cm above the carina. There is a right-sided internal jugular central venous catheter with tip terminating in the distal superior vena cava. Lung volumes are slightly low. Bibasilar opacities (left greater than right), which may reflect areas of atelectasis and/or consolidation. No definite pleural effusions. Moderate cardiomegaly. Lucencies adjacent to the heart and mediastinal contours, compatible with residual pneumomediastinum. No evidence of pulmonary edema. Small amount of gas in the cervical soft tissues. IMPRESSION: 1. Support  apparatus, as above. 2. Persistent bibasilar areas of atelectasis and/or consolidation. 3. Moderate cardiomegaly. 4. Persistent pneumomediastinum. Electronically Signed   By: Vinnie Langton M.D.   On: 06/17/2019 06:40   DG Chest Port 1 View  Result Date: 06/16/2019 CLINICAL DATA:  Respiratory failure. EXAM: PORTABLE CHEST 1 VIEW COMPARISON:  CT 06/15/2019.  Chest x-ray 06/15/2019. FINDINGS: Tracheostomy tube, feeding tube, right central line in unchanged position. Pneumomediastinum again noted. Persistent very tiny biapical pneumothoraces cannot be completely excluded. Persistent right side greater than left bilateral pulmonary infiltrates/edema again noted. Interim improvement of aeration in the right lung from prior exam. No prominent pleural effusion. Stable cardiomegaly. Subcutaneous emphysema about the neck and supraclavicular regions again noted. IMPRESSION: 1. Tracheostomy tube, feeding tube, right central line in unchanged position. 2. Pneumomediastinum again noted. Persistent very tiny biapical pneumothoraces cannot be completely excluded. Subcutaneous emphysema about the neck and supraclavicular regions again noted. 3. Persistent right side greater than left bilateral pulmonary infiltrates/edema again noted. Interim improvement of aeration right lung from prior exam. 4.  Stable cardiomegaly. Electronically Signed   By: Marcello Moores  Register   On: 06/16/2019 06:27   DG Chest Port 1 View  Addendum Date: 06/15/2019   ADDENDUM REPORT: 06/15/2019 11:07 ADDENDUM: These results were called by telephone at the time of interpretation on 06/15/2019 at 11:07 am to provider Gwyndolyn Saxon MINOR , who verbally acknowledged these results. Specifically the possibility that the right pneumothorax in particular may be larger than expected based on appearance and perhaps associated with more pronounced anterior component was discussed. CT may be helpful for further assessment. Electronically Signed   By: Zetta Bills M.D.    On: 06/15/2019 11:07   Result Date: 06/15/2019 CLINICAL DATA:  Tracheostomy tube change. EXAM: PORTABLE CHEST 1 VIEW COMPARISON:  06/14/2019 and 06/15/2019 FINDINGS: Right IJ dialysis catheter remains in place tip at the caval to atrial junction. Tracheostomy tube in place terminating, tip over mid trachea. Heart size remains enlarged with globular appearance. Diffuse airspace  disease in the right chest as before with some medial lucency along the right heart border. Patchy airspace disease in the left chest similar to previous study. Discrete pneumothorax in the left chest is not visible on the current study, seen on the more recent prior. There is some lucency along the left heart border and at the left costodiaphragmatic sulcus. Subcutaneous emphysema in the right neck similar to prior study. No acute bone finding. IMPRESSION: 1. Tracheostomy tube in place. 2. Persistent cardiomegaly with globular appearance of the heart. 3. Persistent diffuse airspace disease in the right chest greater than left. 4. Lucency along the left heart border and at the left costodiaphragmatic sulcus. This likely reflects pneumomediastinum that was seen on previous imaging. The pneumothorax with anterior component is also considered as the discrete pleural line seen at the lung apices are not seen on the current exam. Film may underestimate the size of pneumothoraces particularly in right chest though there is no significant change from the recent comparison study. 5. A call is out to the referring provider to further discuss findings in the above case. Electronically Signed: By: Zetta Bills M.D. On: 06/15/2019 10:50   DG Chest Port 1 View  Result Date: 06/15/2019 CLINICAL DATA:  Code stroke. Hypoxia. EXAM: PORTABLE CHEST 1 VIEW COMPARISON:  06/14/2019 FINDINGS: Feeding tube tip is below the GE junction. Tracheostomy tube tip above carina. Right chest wall dialysis catheter is identified with tips at the cavoatrial junction.  Stable cardiac enlargement. Pneumo mediastinum appears stable to decreased in the interval. Persistent small biapical pneumothoraces. No change in diffuse bilateral lung opacities IMPRESSION: 1. Stable to improved pneumomediastinum. Persistence of small biapical pneumothoraces. 2. No change in aeration to lungs compared with previous exam. Electronically Signed   By: Kerby Moors M.D.   On: 06/15/2019 08:50   DG CHEST PORT 1 VIEW  Result Date: 06/14/2019 CLINICAL DATA:  Congestive heart failure, myocarditis EXAM: PORTABLE CHEST 1 VIEW COMPARISON:  06/13/2019 FINDINGS: Single frontal view of the chest demonstrates stable right internal jugular catheter, tracheostomy tube, and enteric catheter. The cardiac silhouette remains enlarged. Since the prior exam, trace biapical pneumothoraces have developed volume estimated approximately 5-10% each. Additionally, there is diffuse pneumomediastinum with subcutaneous gas dissecting into the supraclavicular regions. No effusion.  Persistent bilateral airspace disease. IMPRESSION: 1. Interval development of small biapical pneumothoraces and diffuse pneumomediastinum, possibly related to barotrauma. No tension effect. 2. Widespread bilateral airspace disease unchanged. 3. Continued enlarged cardiac silhouette. These results were called by telephone at the time of interpretation on 06/14/2019 at 7:53 pm to patient's nurse, Chester Holstein, who verbally acknowledged these results. Electronically Signed   By: Randa Ngo M.D.   On: 06/14/2019 19:53   DG Chest Port 1V same Day  Result Date: 06/26/2019 CLINICAL DATA:  Respiratory distress.  Cerebral vascular accident. EXAM: PORTABLE CHEST 1 VIEW COMPARISON:  06/25/2019 FINDINGS: Tracheostomy tube central venous line unchanged. Stable enlarged cardiac silhouette. Diffuse airspace disease unchanged from prior. Greatest density in the RIGHT lower lobe. No pneumothorax. IMPRESSION: 1. No interval change.  Stable support apparatus. 2.  Diffuse bilateral severe airspace disease. Electronically Signed   By: Suzy Bouchard M.D.   On: 06/26/2019 09:17   DG Abd Portable 1V  Result Date: 06/18/2019 CLINICAL DATA:  Nasogastric tube placement EXAM: PORTABLE ABDOMEN - 1 VIEW COMPARISON:  June 11, 2019 FINDINGS: Nasogastric tube tip and side port are in the stomach. No bowel dilatation or air-fluid level to suggest bowel obstruction. No free air. IMPRESSION: Nasogastric  tube tip and side port in stomach. No bowel obstruction or free air demonstrable on supine examination. Electronically Signed   By: Lowella Grip III M.D.   On: 06/18/2019 11:29   DG Swallowing Func-Speech Pathology  Result Date: 07/08/2019 Objective Swallowing Evaluation: Type of Study: MBS-Modified Barium Swallow Study  Patient Details Name: Carlos Brewer. MRN: [id number] Date of Birth: 05-Feb-1986 Today's Date: 07/08/2019 Time: SLP Start Time (ACUTE ONLY): 0930 -SLP Stop Time (ACUTE ONLY): 0950 SLP Time Calculation (min) (ACUTE ONLY): 20 min Past Medical History: Past Medical History: Diagnosis Date . CHF (congestive heart failure) ([place])  . Cocaine abuse ([place])  . HFrEF (heart failure with reduced ejection fraction) ([place])   a. 10/2016 Echo: EF 15-20%, Gr2 DD, mildly dil LA/RA. Marland Kitchen History of cardiac cath   a. 10/2016 Cath: LM nl, LAD min irregs, LCX nl, RCA nl, EF 15%. . Morbid obesity ([place])  . Myocarditis ([place])   a. 10/2016 Admit w/ CHF and trop elevation; b. 10/2016 Echo: EF 15-20%; c. 10/2016 Cath: Min irregs in LAD otw nl cors, EF 15%, glob HK. Marland Kitchen NICM (nonischemic cardiomyopathy) ([place])   a. 10/2016 Echo: EF 15-20%, Gr2 DD; b. 10/2016 Cath: Nl cors. . Palpitations  . Sleep apnea   USES CPAP . Tobacco abuse  Past Surgical History: Past Surgical History: Procedure Laterality Date . CORONARY ANGIOPLASTY   . IR CT HEAD LTD  05/14/2019 . IR FLUORO GUIDE CV LINE RIGHT  05/17/2019 . IR GASTROSTOMY TUBE MOD SED  06/23/2019 . IR PATIENT EVAL TECH 0-60 MINS  04/23/2019 . IR PERCUTANEOUS ART  THROMBECTOMY/INFUSION INTRACRANIAL INC DIAG ANGIO  05/15/2019 . IR REPLC GASTRO/COLONIC TUBE PERCUT W/FLUORO  07/04/2019 . IR US GUIDE VASC ACCESS RIGHT  05/17/2019 . NO PAST SURGERIES   . RADIOLOGY WITH ANESTHESIA N/A 04/19/2019  Procedure: RADIOLOGY WITH ANESTHESIA;  Surgeon: Luanne Bras, MD;  Location: [facility];  Service: Radiology;  Laterality: N/A; . RIGHT/LEFT HEART CATH AND CORONARY ANGIOGRAPHY N/A 10/27/2016  Procedure: RIGHT/LEFT HEART CATH AND CORONARY ANGIOGRAPHY;  Surgeon: Wellington Hampshire, MD;  Location: [facility];  Service: Cardiovascular;  Laterality: N/A; . WISDOM TOOTH EXTRACTION  2008 HPI: Pt is a 34 y/o male smoker who initially presented on 2/5 with slurred speech and Rt sided weakness. Admitted with left MCA CVA and multiple smaller embolic infarcts s/p tPA and thrombectomy in IR.  CT head 3/31: subacute to chronic infarctions of the frontal lobes, right cerebellar hemisphere, and pons.  Additionally found to have left ventricular  thrombus. Hospital course complicated by septic shock r/t HCAP, AKI requiring CRRT, polymorphic VT and cardiogenic shock. Echo EF < 20%. Pt with Intermittent pressor dependence, required tracheostomy 2/18.  Has been in and out of ICU and has required intermittent vent support. G-tube placed 06/23/19. Sent back to ICU 4/11 for CRRT.  Subjective: alert, cooperative Assessment / Plan / Recommendation CHL IP CLINICAL IMPRESSIONS 07/08/2019 Clinical Impression Pt demonstrated a mild pharyngeal dysphagia with intermittent trace aspiration of thin liquids.  Aspiration occurred before the onset of the pharyngeal swallow, with liquids spilling into the laryngeal vestibule before it could be closed.  Aspiration elicited a consistent cough response. (PMV was in place for procedure).  Nectar-thick liquids transited through the pharynx without incident (no aspiration).  Purees and mechanical solids were masticated with mild diffuse residue remaining in the oral cavity after  the swallow; they were propelled through pharynx with good clearance, no residue post-swallow.  Recommend resuming an oral diet of dysphagia  3; nectar-thick liquids for now.  SLP to focus on improving timing of swallow with thin liquids; diet can be upgraded per clinical judgment since aspiration is accompanied by a cough response.  D/W RN.  Will follow. SLP Visit Diagnosis Dysphagia, pharyngeal phase (R13.13) Attention and concentration deficit following -- Frontal lobe and executive function deficit following -- Impact on safety and function Mild aspiration risk   CHL IP TREATMENT RECOMMENDATION 07/08/2019 Treatment Recommendations Therapy as outlined in treatment plan below   Prognosis 07/08/2019 Prognosis for Safe Diet Advancement Good Barriers to Reach Goals -- Barriers/Prognosis Comment -- CHL IP DIET RECOMMENDATION 07/08/2019 SLP Diet Recommendations Dysphagia 3 (Mech soft) solids;Nectar thick liquid Liquid Administration via Cup;Straw Medication Administration Whole meds with puree Compensations Small sips/bites Postural Changes --   CHL IP OTHER RECOMMENDATIONS 07/08/2019 Recommended Consults -- Oral Care Recommendations Oral care BID Other Recommendations Order thickener from pharmacy;Place PMSV during PO intake   [facility]L IP FOLLOW UP RECOMMENDATIONS 07/06/2019 Follow up Recommendations 24 hour supervision/assistance;LTACH;Skilled Nursing facility   [facility] IP FREQUENCY AND DURATION 07/08/2019 Speech Therapy Frequency (ACUTE ONLY) min 2x/week Treatment Duration 2 weeks      CHL IP ORAL PHASE 07/08/2019 Oral Phase Impaired Oral - Pudding Teaspoon -- Oral - Pudding Cup -- Oral - Honey Teaspoon -- Oral - Honey Cup -- Oral - Nectar Teaspoon WFL Oral - Nectar Cup -- Oral - Nectar Straw WFL Oral - Thin Teaspoon NT Oral - Thin Cup -- Oral - Thin Straw WFL Oral - Puree Lingual/palatal residue Oral - Mech Soft Lingual/palatal residue;Right pocketing in lateral sulci;Left pocketing in lateral sulci Oral - Regular -- Oral -  Multi-Consistency -- Oral - Pill -- Oral Phase - Comment --  CHL IP PHARYNGEAL PHASE 07/08/2019 Pharyngeal Phase Impaired Pharyngeal- Pudding Teaspoon -- Pharyngeal -- Pharyngeal- Pudding Cup -- Pharyngeal -- Pharyngeal- Honey Teaspoon -- Pharyngeal -- Pharyngeal- Honey Cup -- Pharyngeal -- Pharyngeal- Nectar Teaspoon -- Pharyngeal -- Pharyngeal- Nectar Cup Delayed swallow initiation-pyriform sinuses;Delayed swallow initiation-vallecula Pharyngeal -- Pharyngeal- Nectar Straw Delayed swallow initiation-vallecula;Delayed swallow initiation-pyriform sinuses Pharyngeal -- Pharyngeal- Thin Teaspoon -- Pharyngeal -- Pharyngeal- Thin Cup Delayed swallow initiation-pyriform sinuses;Penetration/Aspiration before swallow;Trace aspiration Pharyngeal Material enters airway, passes BELOW cords and not ejected out despite cough attempt by patient Pharyngeal- Thin Straw Delayed swallow initiation-pyriform sinuses;Trace aspiration;Penetration/Aspiration before swallow Pharyngeal Material enters airway, passes BELOW cords and not ejected out despite cough attempt by patient Pharyngeal- Puree WFL Pharyngeal -- Pharyngeal- Mechanical Soft WFL Pharyngeal -- Pharyngeal- Regular -- Pharyngeal -- Pharyngeal- Multi-consistency -- Pharyngeal -- Pharyngeal- Pill -- Pharyngeal -- Pharyngeal Comment --  No flowsheet data found. [name] 07/08/2019, 10:47 AM               Microbiology: Recent Results (from the past 240 hour(s))  Culture, blood (routine x 2)     Status: None (Preliminary result)   Collection Time: 2019/07/11  6:46 PM   Specimen: BLOOD RIGHT HAND  Result Value Ref Range Status   Specimen Description BLOOD RIGHT HAND  Final   Special Requests   Final    BOTTLES DRAWN AEROBIC AND ANAEROBIC Blood Culture adequate volume   Culture   Final    NO GROWTH 3 DAYS Performed at [facility], [place] [address], [place], [place] [address]    Report Status PENDING  Incomplete  Culture, blood (routine x 2)      Status: None (Preliminary result)   Collection Time: 11-Jul-2019  6:50 PM   Specimen: BLOOD  Result Value  Ref Range Status   Specimen Description BLOOD RIGHT ANTECUBITAL  Final   Special Requests   Final    BOTTLES DRAWN AEROBIC ONLY Blood Culture adequate volume   Culture   Final    NO GROWTH 3 DAYS Performed at [facility], [address] [place], [place] [address]    Report Status PENDING  Incomplete     Labs: Basic Metabolic Panel: Recent Labs  Lab 07/06/19 0302 07/06/19 0302 07/07/19 0634 07/07/19 0634 07/08/19 0458 07-29-19 0356  NA 139  --  136  --  135 137  K 4.5   < > 4.0   < > 4.0 3.7  CL 97*  --  92*  --  97* 96*  CO2 25  --  26  --  25 24  GLUCOSE 126*  --  179*  --  201* 123*  BUN 55*  --  90*  --  39* 57*  CREATININE 3.78*  --  4.74*  --  3.12* 3.99*  CALCIUM 9.5  --  9.8  --  9.5 10.1   < > = values in this interval not displayed.   Liver Function Tests: No results for input(s): AST, ALT, ALKPHOS, BILITOT, PROT, ALBUMIN in the last 168 hours. No results for input(s): LIPASE, AMYLASE in the last 168 hours. No results for input(s): AMMONIA in the last 168 hours. CBC: Recent Labs  Lab 07/06/19 0302 07/07/19 0634 07/08/19 0458 Jul 29, 2019 0356  WBC 6.8 7.6 8.1 14.3*  NEUTROABS 4.8 5.6 6.3 12.8*  HGB 9.7* 9.5* 8.8* 10.5*  HCT 33.2* 32.1* 30.4* 35.0*  MCV 99.4 97.9 96.8 97.0  PLT 364 399 358 410*   Cardiac Enzymes: No results for input(s): CKTOTAL, CKMB, CKMBINDEX, TROPONINI in the last 168 hours. D-Dimer No results for input(s): DDIMER in the last 72 hours. BNP: Invalid input(s): POCBNP CBG: Recent Labs  Lab 07-29-2019 1205 07/29/19 1633 07/29/19 1928 07-29-19 2130 2019/07/29 2255  GLUCAP 174* 309* 279* 364* 282*   Anemia work up No results for input(s): VITAMINB12, FOLATE, FERRITIN, TIBC, IRON, RETICCTPCT in the last 72 hours. Urinalysis    Component Value Date/Time   COLORURINE AMBER (A) 04/23/2019 0930   APPEARANCEUR CLOUDY (A)  04/23/2019 0930   LABSPEC 1.025 04/23/2019 0930   PHURINE 6.0 04/23/2019 0930   GLUCOSEU 50 (A) 04/23/2019 0930   HGBUR LARGE (A) 04/23/2019 0930   BILIRUBINUR NEGATIVE 04/23/2019 0930   KETONESUR NEGATIVE 04/23/2019 0930   PROTEINUR >=300 (A) 04/23/2019 0930   NITRITE NEGATIVE 04/23/2019 0930   LEUKOCYTESUR NEGATIVE 04/23/2019 0930   Sepsis Labs Invalid input(s): PROCALCITONIN,  WBC,  LACTICIDVEN    SIGNED:  Marylu Lund, MD  [facility] 07/12/2019, 12:47 PM  If 7PM-7AM, please contact night-coverage [email or web address] Password [email or web address]

## 2019-07-10 NOTE — Significant Event (Addendum)
PCCM Significant Event:   Paged to the ICU for CPR in progress.  Patient had been more tachycardic and tachypneic this evening.  Patient has had a complicated hospital course after presenting with CVA initially, chronic respiratory failure requiring trach, end-stage cardiomyopathy with VT/LV thrombus, ESRD, HIT.  Note states that mother has been feeding him on thickened fluids at the bedside and he was noted to have increased coughing.  Heart rate was in the 120s and he was given 5 mg of labetalol.  Hypotensive 80 sweating and hyperventilating.  Rapid response evaluated patient, he was noted to have a respiratory rate in the 20s and oxygen 94% on trach collar 28% FiO2.  Several hours later he came pulseless and unresponsive and pulseless.  CODE BLUE was called, patient received approximately epi x7, 2 A of bicarb.  ROSC was achieved after 27 minutes of CPR with weak pulse and bradycardia.  En route to the ICU patient coded again.  He was given approximately milligrams epinephrine with bicarb, magnesium and calcium, no shockable rhythm was obtained.  Bedside ultrasound did not reveal obvious pneumothorax.  After additional 18 minutes of CPR there was no sign of cardiac activity on ultrasound and he remained pulseless.  Family at the bedside.  Time of death was called at 70.  Reviewed with PCCM attending and CODE BLUE team and support offered to family.    Otilio Carpen Vasco Chong, PA-C

## 2019-07-10 NOTE — Progress Notes (Signed)
Chaplain responded to both Code Blues first 3W then 69M.  Chaplain made contact with mother Carlos Brewer and asked her to come. Chaplain supported the family in the conference room and then bedside as medical team assessed.  Patient did not recover.  Chaplain offered prayer and support for the mother and step father at bedside.  Chaplain got the contact information and gave to the SN Chaplain provided family with patient placement card and educated on calling when funeral home is decided.  Chaplain available to assist as needed. [place], MDiv.     07/10/19 0000  Clinical Encounter Type  Visited With Health care provider;Family  Visit Type Critical Care;Code  Referral From Nurse  Consult/Referral To Chaplain  Stress Factors  Family Stress Factors Loss

## 2019-07-10 NOTE — Progress Notes (Signed)
Pt arrived to ICU SB HR 60s, declined further to 30's with no palpable pulse. Code blue initiated, see code documentation. TOD 08/01/19 2355, called by Elease Etienne, PCCM PA. Family support provided, pt belongings returned to family at bedside.

## 2019-07-10 NOTE — Progress Notes (Signed)
Pt noted to have low O2 at 2251 via RRT/tele. Code blue called at 2253.

## 2019-07-10 NOTE — Significant Event (Signed)
  Patient Name: Carlos Brewer.   MRN: [id number]   Date of Birth/ Sex: 1985/12/25 , male      Admission Date: 05/15/2019  Attending Provider: Donne Hazel, MD  Primary Diagnosis: Stroke [facility]) [I63.9] Stroke (cerebrum) [facility]) [I63.9] Acute CVA (cerebrovascular accident) [facility]) [I63.9] Cerebrovascular accident (CVA) due to thrombosis of left middle cerebral artery ([place]) [I63.312] Middle cerebral artery embolism, left [I66.02]   Indication: Pt was in his usual state of health until this PM, when he was noted to be altered and without a pulse. Code blue was subsequently called. At the time of arrival on scene, ACLS protocol was underway.   Technical Description:  - CPR performance duration:  27 minutes on floor and 18 minutes in ICU  - Was defibrillation or cardioversion used? No   - Was external pacer placed? No  - Was patient intubated pre/post CPR? Yes   Medications Administered: Y = Yes; Blank = No Amiodarone    Atropine    Calcium    Epinephrine  Y  Lidocaine    Magnesium    Norepinephrine  Y  Phenylephrine    Sodium bicarbonate  Y  Vasopressin    Other    Post CPR evaluation:  - Final Status - Was patient successfully resuscitated ? No   Miscellaneous Information:  - Time of death:  06/20/2353 PM  - Primary team notified?  Yes  - Family Notified? Yes     Ladona Horns, MD   07/10/2019, 12:19 AM

## 2019-07-11 DIAGNOSIS — R310 Gross hematuria: Secondary | ICD-10-CM | POA: Diagnosis present

## 2019-07-11 DIAGNOSIS — A419 Sepsis, unspecified organism: Secondary | ICD-10-CM | POA: Diagnosis present

## 2019-07-11 DIAGNOSIS — E876 Hypokalemia: Secondary | ICD-10-CM | POA: Diagnosis present

## 2019-07-11 DIAGNOSIS — I472 Ventricular tachycardia: Secondary | ICD-10-CM

## 2019-07-11 DIAGNOSIS — G9341 Metabolic encephalopathy: Secondary | ICD-10-CM | POA: Diagnosis present

## 2019-07-11 DIAGNOSIS — E875 Hyperkalemia: Secondary | ICD-10-CM | POA: Clinically undetermined

## 2019-07-11 DIAGNOSIS — N170 Acute kidney failure with tubular necrosis: Secondary | ICD-10-CM | POA: Diagnosis present

## 2019-07-11 DIAGNOSIS — I240 Acute coronary thrombosis not resulting in myocardial infarction: Secondary | ICD-10-CM | POA: Diagnosis present

## 2019-07-11 DIAGNOSIS — R809 Proteinuria, unspecified: Secondary | ICD-10-CM | POA: Diagnosis present

## 2019-07-11 DIAGNOSIS — E872 Acidosis, unspecified: Secondary | ICD-10-CM | POA: Diagnosis present

## 2019-07-11 DIAGNOSIS — I4729 Other ventricular tachycardia: Secondary | ICD-10-CM

## 2019-07-11 DIAGNOSIS — I5189 Other ill-defined heart diseases: Secondary | ICD-10-CM | POA: Diagnosis present

## 2019-07-11 DIAGNOSIS — B999 Unspecified infectious disease: Secondary | ICD-10-CM

## 2019-07-11 DIAGNOSIS — D689 Coagulation defect, unspecified: Secondary | ICD-10-CM | POA: Diagnosis present

## 2019-07-11 DIAGNOSIS — Z720 Tobacco use: Secondary | ICD-10-CM | POA: Diagnosis present

## 2019-07-11 DIAGNOSIS — D682 Hereditary deficiency of other clotting factors: Secondary | ICD-10-CM | POA: Diagnosis present

## 2019-07-11 DIAGNOSIS — E7220 Disorder of urea cycle metabolism, unspecified: Secondary | ICD-10-CM | POA: Clinically undetermined

## 2019-07-11 DIAGNOSIS — N179 Acute kidney failure, unspecified: Secondary | ICD-10-CM

## 2019-07-11 DIAGNOSIS — E778 Other disorders of glycoprotein metabolism: Secondary | ICD-10-CM | POA: Clinically undetermined

## 2019-07-11 DIAGNOSIS — R9431 Abnormal electrocardiogram [ECG] [EKG]: Secondary | ICD-10-CM

## 2019-07-11 DIAGNOSIS — R471 Dysarthria and anarthria: Secondary | ICD-10-CM | POA: Diagnosis present

## 2019-07-11 DIAGNOSIS — I498 Other specified cardiac arrhythmias: Secondary | ICD-10-CM | POA: Diagnosis not present

## 2019-07-11 DIAGNOSIS — R000 Tachycardia, unspecified: Secondary | ICD-10-CM | POA: Diagnosis present

## 2019-07-11 DIAGNOSIS — D696 Thrombocytopenia, unspecified: Secondary | ICD-10-CM | POA: Diagnosis not present

## 2019-07-11 DIAGNOSIS — D75829 Heparin-induced thrombocytopenia, unspecified: Secondary | ICD-10-CM | POA: Diagnosis not present

## 2019-07-11 DIAGNOSIS — G8191 Hemiplegia, unspecified affecting right dominant side: Secondary | ICD-10-CM

## 2019-07-11 DIAGNOSIS — Z9282 Status post administration of tPA (rtPA) in a different facility within the last 24 hours prior to admission to current facility: Secondary | ICD-10-CM

## 2019-07-11 DIAGNOSIS — D7582 Heparin induced thrombocytopenia (HIT): Secondary | ICD-10-CM | POA: Diagnosis not present

## 2019-07-11 DIAGNOSIS — I10 Essential (primary) hypertension: Secondary | ICD-10-CM | POA: Diagnosis present

## 2019-07-11 DIAGNOSIS — F1410 Cocaine abuse, uncomplicated: Secondary | ICD-10-CM | POA: Diagnosis not present

## 2019-07-11 DIAGNOSIS — Z22322 Carrier or suspected carrier of Methicillin resistant Staphylococcus aureus: Secondary | ICD-10-CM

## 2019-07-11 DIAGNOSIS — I447 Left bundle-branch block, unspecified: Secondary | ICD-10-CM | POA: Clinically undetermined

## 2019-07-11 DIAGNOSIS — I4891 Unspecified atrial fibrillation: Secondary | ICD-10-CM | POA: Diagnosis not present

## 2019-07-11 DIAGNOSIS — I519 Heart disease, unspecified: Secondary | ICD-10-CM | POA: Diagnosis present

## 2019-07-11 DIAGNOSIS — E8809 Other disorders of plasma-protein metabolism, not elsewhere classified: Secondary | ICD-10-CM | POA: Clinically undetermined

## 2019-07-11 DIAGNOSIS — I639 Cerebral infarction, unspecified: Secondary | ICD-10-CM | POA: Diagnosis present

## 2019-07-11 DIAGNOSIS — J90 Pleural effusion, not elsewhere classified: Secondary | ICD-10-CM | POA: Diagnosis present

## 2019-07-12 DIAGNOSIS — R491 Aphonia: Secondary | ICD-10-CM | POA: Diagnosis present

## 2019-07-12 DIAGNOSIS — I639 Cerebral infarction, unspecified: Secondary | ICD-10-CM

## 2019-07-12 DIAGNOSIS — L8990 Pressure ulcer of unspecified site, unspecified stage: Secondary | ICD-10-CM | POA: Diagnosis not present

## 2019-07-12 DIAGNOSIS — E160 Drug-induced hypoglycemia without coma: Secondary | ICD-10-CM

## 2019-07-12 DIAGNOSIS — Z931 Gastrostomy status: Secondary | ICD-10-CM

## 2019-07-12 DIAGNOSIS — G9389 Other specified disorders of brain: Secondary | ICD-10-CM

## 2019-07-12 DIAGNOSIS — H5500 Unspecified nystagmus: Secondary | ICD-10-CM

## 2019-07-12 DIAGNOSIS — I69321 Dysphasia following cerebral infarction: Secondary | ICD-10-CM

## 2019-07-12 DIAGNOSIS — R1112 Projectile vomiting: Secondary | ICD-10-CM | POA: Diagnosis not present

## 2019-07-12 DIAGNOSIS — R29898 Other symptoms and signs involving the musculoskeletal system: Secondary | ICD-10-CM

## 2019-07-12 DIAGNOSIS — R0689 Other abnormalities of breathing: Secondary | ICD-10-CM | POA: Diagnosis not present

## 2019-07-12 DIAGNOSIS — J690 Pneumonitis due to inhalation of food and vomit: Secondary | ICD-10-CM | POA: Diagnosis not present

## 2019-07-12 DIAGNOSIS — E870 Hyperosmolality and hypernatremia: Secondary | ICD-10-CM | POA: Diagnosis not present

## 2019-07-12 DIAGNOSIS — L89606 Pressure-induced deep tissue damage of unspecified heel: Secondary | ICD-10-CM | POA: Diagnosis not present

## 2019-07-12 DIAGNOSIS — R063 Periodic breathing: Secondary | ICD-10-CM

## 2019-07-12 DIAGNOSIS — T85898A Other specified complication of other internal prosthetic devices, implants and grafts, initial encounter: Secondary | ICD-10-CM | POA: Diagnosis not present

## 2019-07-14 LAB — CULTURE, BLOOD (ROUTINE X 2)
Culture: NO GROWTH
Culture: NO GROWTH
Special Requests: ADEQUATE
Special Requests: ADEQUATE

## 2019-07-15 LAB — PTH, INTACT AND CALCIUM
Calcium, Total (PTH): 10 mg/dL (ref 8.7–10.2)
PTH: 116 pg/mL — ABNORMAL HIGH (ref 15–65)

## 2019-07-16 NOTE — Progress Notes (Addendum)
   Jul 16, 2019 1945  Vitals  BP 108/72  BP Location Right Arm  BP Method Automatic  Patient Position (if appropriate) Lying  Pulse Rate (!) 120  Pulse Rate Source Monitor  ECG Heart Rate (!) 120  Resp (!) 24  Oxygen Therapy  SpO2 100 %  O2 Device Tracheostomy Collar  O2 Flow Rate (L/min) 6 L/min  MEWS Score  MEWS Temp 0  MEWS Systolic 0  MEWS Pulse 2  MEWS RR 1  MEWS LOC 0  MEWS Score 3  MEWS Score Color Yellow    5 mg Labetalol given with good effects at 1945

## 2019-07-16 NOTE — Progress Notes (Signed)
PROGRESS NOTE    Carlos Brewer.  OIT:[id number] DOB: Jan 10, 1986 DOA: 05/02/2019 PCP: Patient, No Pcp Per    Brief Narrative:  34 yo male smoker found to have slurred speech and Rt sided weakness. Admitted 2/5 with left MCA CVA and multiple smaller embolic infarcts>>>tPA and thrombectomy in IR. Additionally found to have left ventricular thrombus. Course complicated by septic shock r/t HCAP, AKI requiring CRRT, polymorphic VT and cardiogenic shock. Intermittent pressor dependence, required tracheostomy, intermittent hemodialysis, pontine CVA, Heart failure with reduced ejection fraction < 15% on last echocardiogram, Recurrent episodes of bilateral infiltrates and mechanical ventilation, ??Aspiration pneumonitis.  Patient has been somewhat stabilized in the ICU.  [facility] hospitalist to resume care on 07/03/2019  Assessment & Plan:   Active Problems:   Occlusion of left middle cerebral artery   Middle cerebral artery embolism, left   Cerebrovascular accident (CVA) due to thrombosis of left middle cerebral artery (HCC)   Shock ([place])   Shock liver   Respiratory failure (HCC)   Pressure injury of skin   Tracheostomy tube present ([place])   Acute CVA (cerebrovascular accident) ([place])   Acute kidney injury ([place])   Endotracheal tube present   Complication of tracheostomy tube ([place])   Acute hypoxemic respiratory failure (HCC)   Fever   Type 2 diabetes mellitus with other specified complication (HCC)   Hypoxia   HFrEF (heart failure with reduced ejection fraction) ([place])   Pneumothorax   Pneumomediastinum (HCC)   Hemoptysis   Palliative care encounter   Acute renal failure ([place])   Acute on chronic respiratory failure Tracheostomy with intermittent bleeding Stable Currently continued on trach collar, stable at 28% FiO2 Continue Pulmonary hygiene PCCM following for trach/vent needs Per nursing staff, noted to have only small to moderate amounts of sputum suctioned out See below.  Increasing resp rate and tachycardia, now with leukocytosis meeting sepsis criteria Will resume vancomycin and meropenem for broad coverage  End-stage cardiomyopathy/chronic systolic HF/polymorphic VT/LV thrombus Followed by cardiology, signed off on 4/3 as patient is not a candidate for advanced heart failure therapies Continue hydralazine, midodrine INR currently 3.1. Cont dosing per pharmacy Cont on tele as tolerated  End-stage renal failure Multifactorial including ATN following cardiogenic/septic shock Anuric without evidence of renal recovery thus far Status post CRRT, stopped on 4/15 Continue with HD per Nephrology  Diabetes mellitus type 2 SSI, Lantus, Accu-Cheks, hypoglycemic protocol Glycemic currently stable  HIT Continue Angiomax For the management by pharmacy  CVA on admission Continue with ASA, Lipitor as tolerated  Radiopaque density in abdomen Suspicion for ingested foreign body Serial KUBs with density perhaps in descending colon Repeat abd xray from today reviewed. 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 Discussed with pharmacy Repeat  CBC in AM  DVT  prophylaxis: SCD's Code Status: Full Family Communication: Pt in room, family not at bedside  Status is: Inpatient  Remains inpatient appropriate because:Altered mental status and IV treatments appropriate due to intensity of illness or inability to take PO   Dispo: The patient is from: Home              Anticipated d/c is to: Complicated disposition currently being worked on by [facility], possible LTAC              Anticipated d/c date is: > 3 days              Patient currently is not medically stable to d/c.  Consultants:   PCCM  Cardiology/EP  Nephrology  Neurology  IR  Palliative  Procedures:  2/5-2/6:  TPA given at 428 am (total of 90 Mg) 520 am went to IR  S/P Lt common carotid arteriogram followed by complete revascularization of occluded LT MCA sup division mid M2 seg with x 1 pass with 54mx 40 mm solitaire X ret river device and penumbra aspiration with TICI3revascularization  ETT 2/05 >> 2/18 [facility]J CVL 2/5 >>2/18  RT [place] CVL 2/18 >>3/2 RIJ HD cath 2/6 >>2/18 6 shiley cuffed trach 2/18 >>3/4  RT fem HD 2/19 >>3/2 RIJ permacath 3/2>> 3/4 shiley 4 uncuffed.... dislodged placed back 6cuffed shiley that evening 3/5: change to #6 cuffed distal XLT  3/17 right femoral CVL >> 3/23 3/17 right femoral arterial line >>3/23 3/31 bronchoscopy : airway clear, conclusion = end of trach up against the wall obstructing airflow   3/31 trach change to cuffed #6 XLT  4/7 PEG by IR  Antimicrobials: Anti-infectives (From admission, onward)   Start     Dose/Rate Route Frequency Ordered Stop   06/28/19 1100  vancomycin (VANCOREADY) IVPB 1250 mg/250 mL  Status:  Discontinued     1,250 mg 166.7 mL/hr over 90 Minutes Intravenous Every 24 hours 06/27/19 0925 06/29/19 1011   06/27/19 1400  meropenem (MERREM) 1 g in sodium chloride 0.9 % 100 mL IVPB  Status:  Discontinued     1 g 200 mL/hr over 30 Minutes Intravenous Every 8 hours 06/27/19 0950 06/27/19 0952    06/27/19 1045  meropenem (MERREM) 1 g in sodium chloride 0.9 % 100 mL IVPB  Status:  Discontinued     1 g 200 mL/hr over 30 Minutes Intravenous Every 8 hours 06/27/19 0952 06/29/19 1011   06/27/19 1015  meropenem (MERREM) 1 g in sodium chloride 0.9 % 100 mL IVPB  Status:  Discontinued     1 g 200 mL/hr over 30 Minutes Intravenous Every 24 hours 06/27/19 0925 06/27/19 0950   06/27/19 1015  vancomycin (VANCOREADY) IVPB 2000 mg/400 mL     2,000 mg 200 mL/hr over 120 Minutes Intravenous  Once 06/27/19 0925 06/27/19 1246   06/23/19 1554  ceFAZolin (ANCEF) IVPB 2g/100 mL premix     over 30 Minutes  Continuous PRN 06/23/19 1554 06/23/19 1554   06/23/19 1544  ceFAZolin (ANCEF) 2-4 GM/100ML-% IVPB    Note to Pharmacy: PTamsen Snider  : cabinet override      06/23/19 1544 06/24/19 0359   06/18/19 1130  Ampicillin-Sulbactam (UNASYN) 3 g in sodium chloride 0.9 % 100 mL IVPB     3 g 200 mL/hr over 30 Minutes Intravenous Every 8 hours 06/18/19 1039 06/19/19 2119   06/16/19 1400  ceFEPIme (MAXIPIME) 2 g in sodium chloride 0.9 % 100 mL IVPB  Status:  Discontinued  2 g 200 mL/hr over 30 Minutes Intravenous Every 12 hours 06/16/19 1216 06/18/19 1010   06/16/19 1400  vancomycin (VANCOREADY) IVPB 1250 mg/250 mL  Status:  Discontinued     1,250 mg 166.7 mL/hr over 90 Minutes Intravenous Every 24 hours 06/16/19 1216 06/18/19 1010   06/16/19 1200  vancomycin (VANCOCIN) IVPB 1000 mg/200 mL premix  Status:  Discontinued     1,000 mg 200 mL/hr over 60 Minutes Intravenous Every T-Th-Sa (Hemodialysis) 06/15/19 0459 06/16/19 1216   06/15/19 2200  ceFEPIme (MAXIPIME) 1 g in sodium chloride 0.9 % 100 mL IVPB  Status:  Discontinued     1 g 200 mL/hr over 30 Minutes Intravenous Every 24 hours 06/15/19 0459 06/16/19 1216   06/15/19 0500  vancomycin (VANCOREADY) IVPB 2000 mg/400 mL     2,000 mg 200 mL/hr over 120 Minutes Intravenous  Once 06/15/19 0459 06/15/19 0802   06/15/19 0500  ceFEPIme (MAXIPIME) 2 g in  sodium chloride 0.9 % 100 mL IVPB     2 g 200 mL/hr over 30 Minutes Intravenous  Once 06/15/19 0459 06/15/19 0941   06/04/19 2200  ceFEPIme (MAXIPIME) 1 g in sodium chloride 0.9 % 100 mL IVPB    Note to Pharmacy: Pharmacy may modify dose as appropriate for renal failure   1 g 200 mL/hr over 30 Minutes Intravenous Daily at bedtime 06/04/19 1347 06/06/19 2303   06/02/19 1200  anidulafungin (ERAXIS) 100 mg in sodium chloride 0.9 % 100 mL IVPB  Status:  Discontinued     100 mg 78 mL/hr over 100 Minutes Intravenous Every 24 hours 06/01/19 1049 06/04/19 1053   06/01/19 2200  ceFEPIme (MAXIPIME) 1 g in sodium chloride 0.9 % 100 mL IVPB  Status:  Discontinued    Note to Pharmacy: Pharmacy may modify dose as appropriate for renal failure   1 g 200 mL/hr over 30 Minutes Intravenous Daily at bedtime 06/01/19 1054 06/04/19 1053   06/01/19 1300  vancomycin (VANCOCIN) IVPB 1000 mg/200 mL premix     1,000 mg 200 mL/hr over 60 Minutes Intravenous  Once 06/01/19 1203 06/01/19 1401   06/01/19 1200  vancomycin (VANCOCIN) IVPB 1000 mg/200 mL premix  Status:  Discontinued     1,000 mg 200 mL/hr over 60 Minutes Intravenous Every M-W-F (Hemodialysis) 06/01/19 0428 06/04/19 1053   06/01/19 1100  anidulafungin (ERAXIS) 200 mg in sodium chloride 0.9 % 200 mL IVPB     200 mg 78 mL/hr over 200 Minutes Intravenous  Once 06/01/19 1049 06/01/19 2002   06/01/19 1045  anidulafungin (ERAXIS) 100 mg in sodium chloride 0.9 % 100 mL IVPB  Status:  Discontinued     100 mg 78 mL/hr over 100 Minutes Intravenous Every 24 hours 06/01/19 1038 06/01/19 1048   06/01/19 0300  ceFEPIme (MAXIPIME) 1,000 mg in sodium chloride 0.9 % 100 mL IVPB  Status:  Discontinued    Note to Pharmacy: Pharmacy may modify dose as appropriate for renal failure   1,000 mg 200 mL/hr over 30 Minutes Intravenous Daily at bedtime 06/01/19 0250 06/01/19 1054   06/01/19 0300  vancomycin (VANCOREADY) IVPB 2000 mg/400 mL     2,000 mg 200 mL/hr over 120  Minutes Intravenous  Once 06/01/19 0252 06/01/19 0843   05/27/19 1000  cefTRIAXone (ROCEPHIN) 2 g in sodium chloride 0.9 % 100 mL IVPB     2 g 200 mL/hr over 30 Minutes Intravenous Every 24 hours 05/27/19 0946 05/31/19 0952   05/17/19 0000  ceFAZolin (ANCEF) IVPB 2g/100 mL  premix     2 g 200 mL/hr over 30 Minutes Intravenous To Radiology 05/16/19 1431 05/17/19 1556   05/09/19 1200  piperacillin-tazobactam (ZOSYN) IVPB 3.375 g     3.375 g 100 mL/hr over 30 Minutes Intravenous Every 6 hours 05/09/19 1029 05/10/19 1830   05/08/19 1800  piperacillin-tazobactam (ZOSYN) IVPB 3.375 g  Status:  Discontinued     3.375 g 12.5 mL/hr over 240 Minutes Intravenous Every 6 hours 05/08/19 1318 05/09/19 1029   05/07/19 1100  vancomycin (VANCOCIN) IVPB 1000 mg/200 mL premix  Status:  Discontinued     1,000 mg 200 mL/hr over 60 Minutes Intravenous Every 24 hours 05/06/19 0949 05/09/19 1019   05/06/19 1030  piperacillin-tazobactam (ZOSYN) IVPB 3.375 g  Status:  Discontinued     3.375 g 12.5 mL/hr over 240 Minutes Intravenous Every 6 hours 05/06/19 0949 05/08/19 1318   05/06/19 1000  vancomycin (VANCOREADY) IVPB 2000 mg/400 mL     2,000 mg 200 mL/hr over 120 Minutes Intravenous  Once 05/06/19 0949 05/06/19 1240   04/24/19 0600  meropenem (MERREM) 1 g in sodium chloride 0.9 % 100 mL IVPB     1 g 200 mL/hr over 30 Minutes Intravenous Every 8 hours 04/23/19 2024 04/29/19 2138   04/23/19 2000  meropenem (MERREM) 2 g in sodium chloride 0.9 % 100 mL IVPB     2 g 200 mL/hr over 30 Minutes Intravenous Every 8 hours 04/23/19 1956 04/23/19 2057   04/23/19 1000  vancomycin (VANCOREADY) IVPB 1250 mg/250 mL  Status:  Discontinued     1,250 mg 166.7 mL/hr over 90 Minutes Intravenous Every 24 hours 04/23/19 0907 04/26/19 1003   04/23/19 1000  piperacillin-tazobactam (ZOSYN) IVPB 3.375 g  Status:  Discontinued     3.375 g 12.5 mL/hr over 240 Minutes Intravenous Every 8 hours 04/23/19 0907 04/23/19 1955   05/15/2019 0549   ceFAZolin (ANCEF) 2-4 GM/100ML-% IVPB    Note to Pharmacy: Fredric Dine   : cabinet override      05/14/2019 0549 04/23/2019 1759      Subjective: This AM complained of headaches  Objective: Vitals:   07-30-2019 1140 Jul 30, 2019 1210 Jul 30, 2019 1533 30-Jul-2019 1635  BP:  101/78  (!) 110/46  Pulse: (!) 112 (!) 127 (!) 123 (!) 122  Resp: 20 (!) 26 18 (!) 28  Temp:  98.1 F (36.7 C)  99 F (37.2 C)  TempSrc:    Tympanic  SpO2: 92% 95% 98% 100%  Weight:      Height:        Intake/Output Summary (Last 24 hours) at 07-30-19 1741 Last data filed at 2019-07-30 1134 Gross per 24 hour  Intake 1410 ml  Output 2000 ml  Net -590 ml   Filed Weights   07/07/19 0927 07/07/19 1344 07-30-19 0725  Weight: 99.4 kg 98.7 kg 98.7 kg    Examination: General exam: Awake, laying in bed, in nad, seen in HD Respiratory system: Increased respiratory effort, no audible wheezing Cardiovascular system: tachycardic, s1, s2 Gastrointestinal system: Soft, nondistended, positive BS Central nervous system: CN2-12 grossly intact, strength intact Extremities: Perfused, no clubbing Skin: Normal skin turgor, no notable skin lesions seen Psychiatry: mood seems normal, affect seems normal  Data Reviewed: I have personally reviewed following labs and imaging studies  CBC: Recent Labs  Lab 07/05/19 0325 07/06/19 0302 07/07/19 0634 07/08/19 0458 2019/07/30 0356  WBC 7.3 6.8 7.6 8.1 14.3*  NEUTROABS  --  4.8 5.6 6.3 12.8*  HGB 10.1* 9.7* 9.5* 8.8*  10.5*  HCT 33.9* 33.2* 32.1* 30.4* 35.0*  MCV 97.7 99.4 97.9 96.8 97.0  PLT 358 364 399 358 892*   Basic Metabolic Panel: Recent Labs  Lab 07/05/19 0325 07/06/19 0302 07/07/19 0634 07/08/19 0458 07-31-2019 0356  NA 139 139 136 135 137  K 4.6 4.5 4.0 4.0 3.7  CL 97* 97* 92* 97* 96*  CO2 19* _0 GLUCOSE 190* 126* 179* 201* 123*  BUN 116* 55* 90* 39* 57*  CREATININE 6.65* 3.78* 4.74* 3.12* 3.99*  CALCIUM 9.2 9.5 9.8 9.5 10.1   GFR: Estimated  Creatinine Clearance: 31.9 mL/min (A) (by C-G formula based on SCr of 3.99 mg/dL (H)). Liver Function Tests: No results for input(s): AST, ALT, ALKPHOS, BILITOT, PROT, ALBUMIN in the last 168 hours. No results for input(s): LIPASE, AMYLASE in the last 168 hours. No results for input(s): AMMONIA in the last 168 hours. Coagulation Profile: Recent Labs  Lab 07/05/19 0325 07/06/19 0302 07/07/19 0634 07/08/19 0458 31-Jul-2019 0356  INR 2.1* 2.2* 3.0* 4.0* 3.1*   Cardiac Enzymes: No results for input(s): CKTOTAL, CKMB, CKMBINDEX, TROPONINI in the last 168 hours. BNP (last 3 results) No results for input(s): PROBNP in the last 8760 hours. HbA1C: No results for input(s): HGBA1C in the last 72 hours. CBG: Recent Labs  Lab 07/08/19 1959 07/31/19 0012 07-31-19 0419 Jul 31, 2019 1205 2019-07-31 1633  GLUCAP 205* 200* 106* 174* 309*   Lipid Profile: No results for input(s): CHOL, HDL, LDLCALC, TRIG, CHOLHDL, LDLDIRECT in the last 72 hours. Thyroid Function Tests: No results for input(s): TSH, T4TOTAL, FREET4, T3FREE, THYROIDAB in the last 72 hours. Anemia Panel: Recent Labs    2019/07/31 0752  FERRITIN 336  TIBC 364  IRON 39*   Sepsis Labs: No results for input(s): PROCALCITON, LATICACIDVEN in the last 168 hours.  No results found for this or any previous visit (from the past 240 hour(s)).   Radiology Studies: DG Abd 1 View  Result Date: 07/08/2019 CLINICAL DATA:  Question foreign body. EXAM: ABDOMEN - 1 VIEW COMPARISON:  07/03/2019. FINDINGS: Gastrostomy tube noted with tip over the left upper abdomen. Contrast noted in the proximal small bowel. Previously identified metallic density noted over the left colon no longer identified. The left upper abdomen however is incompletely imaged. No acute bony abnormality. IMPRESSION: 1. Gastrostomy tube noted with tip over the left upper abdomen. Oral contrast noted in the proximal small bowel. 2. Left upper abdomen is incompletely imaged. No  metallic foreign body identified. Previously identified metallic density noted on abdomen of 07/03/2019 no longer identified. Electronically Signed   By: Marcello Moores  Register   On: 07/08/2019 11:43   DG CHEST PORT 1 VIEW  Result Date: Jul 31, 2019 CLINICAL DATA:  Pneumonia EXAM: PORTABLE CHEST 1 VIEW COMPARISON:  06/30/2019 FINDINGS: No significant change in AP portable examination with diffuse bilateral interstitial and heterogeneous airspace opacity. Tracheostomy. Cardiomegaly. Large-bore right neck vascular catheter. IMPRESSION: 1. No significant change in AP portable examination with diffuse bilateral interstitial and heterogeneous airspace opacity. Tracheostomy. 2.  Cardiomegaly. Electronically Signed   By: Eddie Candle M.D.   On: 07-31-2019 13:07   DG Swallowing Func-Speech Pathology  Result Date: 07/08/2019 Objective Swallowing Evaluation: Type of Study: MBS-Modified Barium Swallow Study  Patient Details Name: Carlos Brewer. MRN: [id number] Date of Birth: Mar 05, 1986 Today's Date: 07/08/2019 Time: SLP Start Time (ACUTE ONLY): 0930 -SLP Stop Time (ACUTE ONLY): 0950 SLP Time Calculation (min) (ACUTE ONLY): 20 min Past Medical History: Past Medical History: Diagnosis Date .  CHF (congestive heart failure) ([place])  . Cocaine abuse ([place])  . HFrEF (heart failure with reduced ejection fraction) ([place])   a. 10/2016 Echo: EF 15-20%, Gr2 DD, mildly dil LA/RA. Marland Kitchen History of cardiac cath   a. 10/2016 Cath: LM nl, LAD min irregs, LCX nl, RCA nl, EF 15%. . Morbid obesity ([place])  . Myocarditis ([place])   a. 10/2016 Admit w/ CHF and trop elevation; b. 10/2016 Echo: EF 15-20%; c. 10/2016 Cath: Min irregs in LAD otw nl cors, EF 15%, glob HK. Marland Kitchen NICM (nonischemic cardiomyopathy) ([place])   a. 10/2016 Echo: EF 15-20%, Gr2 DD; b. 10/2016 Cath: Nl cors. . Palpitations  . Sleep apnea   USES CPAP . Tobacco abuse  Past Surgical History: Past Surgical History: Procedure Laterality Date . CORONARY ANGIOPLASTY   . IR CT HEAD LTD  04/29/2019 . IR  FLUORO GUIDE CV LINE RIGHT  05/17/2019 . IR GASTROSTOMY TUBE MOD SED  06/23/2019 . IR PATIENT EVAL TECH 0-60 MINS  04/23/2019 . IR PERCUTANEOUS ART THROMBECTOMY/INFUSION INTRACRANIAL INC DIAG ANGIO  04/19/2019 . IR REPLC GASTRO/COLONIC TUBE PERCUT W/FLUORO  07/04/2019 . IR US GUIDE VASC ACCESS RIGHT  05/17/2019 . NO PAST SURGERIES   . RADIOLOGY WITH ANESTHESIA N/A 04/19/2019  Procedure: RADIOLOGY WITH ANESTHESIA;  Surgeon: Luanne Bras, MD;  Location: [facility];  Service: Radiology;  Laterality: N/A; . RIGHT/LEFT HEART CATH AND CORONARY ANGIOGRAPHY N/A 10/27/2016  Procedure: RIGHT/LEFT HEART CATH AND CORONARY ANGIOGRAPHY;  Surgeon: Wellington Hampshire, MD;  Location: [facility];  Service: Cardiovascular;  Laterality: N/A; . WISDOM TOOTH EXTRACTION  2008 HPI: Pt is a 34 y/o male smoker who initially presented on 2/5 with slurred speech and Rt sided weakness. Admitted with left MCA CVA and multiple smaller embolic infarcts s/p tPA and thrombectomy in IR.  CT head 3/31: subacute to chronic infarctions of the frontal lobes, right cerebellar hemisphere, and pons.  Additionally found to have left ventricular  thrombus. Hospital course complicated by septic shock r/t HCAP, AKI requiring CRRT, polymorphic VT and cardiogenic shock. Echo EF < 20%. Pt with Intermittent pressor dependence, required tracheostomy 2/18.  Has been in and out of ICU and has required intermittent vent support. G-tube placed 06/23/19. Sent back to ICU 4/11 for CRRT.  Subjective: alert, cooperative Assessment / Plan / Recommendation CHL IP CLINICAL IMPRESSIONS 07/08/2019 Clinical Impression Pt demonstrated a mild pharyngeal dysphagia with intermittent trace aspiration of thin liquids.  Aspiration occurred before the onset of the pharyngeal swallow, with liquids spilling into the laryngeal vestibule before it could be closed.  Aspiration elicited a consistent cough response. (PMV was in place for procedure).  Nectar-thick liquids transited through the pharynx  without incident (no aspiration).  Purees and mechanical solids were masticated with mild diffuse residue remaining in the oral cavity after the swallow; they were propelled through pharynx with good clearance, no residue post-swallow.  Recommend resuming an oral diet of dysphagia 3; nectar-thick liquids for now.  SLP to focus on improving timing of swallow with thin liquids; diet can be upgraded per clinical judgment since aspiration is accompanied by a cough response.  D/W RN.  Will follow. SLP Visit Diagnosis Dysphagia, pharyngeal phase (R13.13) Attention and concentration deficit following -- Frontal lobe and executive function deficit following -- Impact on safety and function Mild aspiration risk   CHL IP TREATMENT RECOMMENDATION 07/08/2019 Treatment Recommendations Therapy as outlined in treatment plan below   Prognosis 07/08/2019 Prognosis for Safe Diet Advancement Good Barriers to Reach Goals -- Barriers/Prognosis  Comment -- CHL IP DIET RECOMMENDATION 07/08/2019 SLP Diet Recommendations Dysphagia 3 (Mech soft) solids;Nectar thick liquid Liquid Administration via Cup;Straw Medication Administration Whole meds with puree Compensations Small sips/bites Postural Changes --   CHL IP OTHER RECOMMENDATIONS 07/08/2019 Recommended Consults -- Oral Care Recommendations Oral care BID Other Recommendations Order thickener from pharmacy;Place PMSV during PO intake   CHL IP FOLLOW UP RECOMMENDATIONS 07/06/2019 Follow up Recommendations 24 hour supervision/assistance;LTACH;Skilled Nursing facility   [facility] IP FREQUENCY AND DURATION 07/08/2019 Speech Therapy Frequency (ACUTE ONLY) min 2x/week Treatment Duration 2 weeks      CHL IP ORAL PHASE 07/08/2019 Oral Phase Impaired Oral - Pudding Teaspoon -- Oral - Pudding Cup -- Oral - Honey Teaspoon -- Oral - Honey Cup -- Oral - Nectar Teaspoon WFL Oral - Nectar Cup -- Oral - Nectar Straw WFL Oral - Thin Teaspoon NT Oral - Thin Cup -- Oral - Thin Straw WFL Oral - Puree Lingual/palatal  residue Oral - Mech Soft Lingual/palatal residue;Right pocketing in lateral sulci;Left pocketing in lateral sulci Oral - Regular -- Oral - Multi-Consistency -- Oral - Pill -- Oral Phase - Comment --  CHL IP PHARYNGEAL PHASE 07/08/2019 Pharyngeal Phase Impaired Pharyngeal- Pudding Teaspoon -- Pharyngeal -- Pharyngeal- Pudding Cup -- Pharyngeal -- Pharyngeal- Honey Teaspoon -- Pharyngeal -- Pharyngeal- Honey Cup -- Pharyngeal -- Pharyngeal- Nectar Teaspoon -- Pharyngeal -- Pharyngeal- Nectar Cup Delayed swallow initiation-pyriform sinuses;Delayed swallow initiation-vallecula Pharyngeal -- Pharyngeal- Nectar Straw Delayed swallow initiation-vallecula;Delayed swallow initiation-pyriform sinuses Pharyngeal -- Pharyngeal- Thin Teaspoon -- Pharyngeal -- Pharyngeal- Thin Cup Delayed swallow initiation-pyriform sinuses;Penetration/Aspiration before swallow;Trace aspiration Pharyngeal Material enters airway, passes BELOW cords and not ejected out despite cough attempt by patient Pharyngeal- Thin Straw Delayed swallow initiation-pyriform sinuses;Trace aspiration;Penetration/Aspiration before swallow Pharyngeal Material enters airway, passes BELOW cords and not ejected out despite cough attempt by patient Pharyngeal- Puree WFL Pharyngeal -- Pharyngeal- Mechanical Soft WFL Pharyngeal -- Pharyngeal- Regular -- Pharyngeal -- Pharyngeal- Multi-consistency -- Pharyngeal -- Pharyngeal- Pill -- Pharyngeal -- Pharyngeal Comment --  No flowsheet data found. Carlos Brewer 07/08/2019, 10:47 AM               Scheduled Meds: . ARIPiprazole  10 mg Per Tube Daily  . aspirin  81 mg Per Tube Daily  . atorvastatin  40 mg Per Tube q1800  . chlorhexidine gluconate (MEDLINE KIT)  15 mL Mouth Rinse BID  . Chlorhexidine Gluconate Cloth  6 each Topical Q0600  . Chlorhexidine Gluconate Cloth  6 each Topical Q0600  . Darbepoetin Alfa  150 mcg Intravenous Q Mon-1800  . feeding supplement (PRO-STAT SUGAR FREE 64)  60 mL Per Tube BID  .  FLUoxetine  20 mg Per Tube Daily  . free water  100 mL Per Tube Q4H  . insulin aspart  0-20 Units Subcutaneous Q4H  . insulin detemir  15 Units Subcutaneous BID  . isosorbide-hydrALAZINE  1 tablet Per Tube BID  . mouth rinse  15 mL Mouth Rinse 10 times per day  . midodrine  10 mg Per Tube Q8H  . pantoprazole sodium  40 mg Per Tube Q1200  . QUEtiapine  50 mg Per Tube QHS  . Resource ThickenUp Clear   Oral Once  . sodium chloride flush  10-40 mL Intracatheter Q12H  . warfarin  7.5 mg Oral ONCE-1600  . Warfarin - Pharmacist Dosing Inpatient   Does not apply q1600   Continuous Infusions: . anticoagulant sodium citrate    . dextrose Stopped (07/03/19 1141)  . dextrose Stopped (07/02/19  1730)  . feeding supplement (VITAL 1.5 CAL) 1,000 mL (07/08/19 1200)     LOS: 78 days   Marylu Lund, MD [facility] Hospitalists Pager On Amion  If 7PM-7AM, please contact night-coverage 2019-07-14, 5:41 PM

## 2019-07-16 NOTE — Progress Notes (Signed)
ANTICOAGULATION CONSULT NOTE - Follow Up Consult  Pharmacy Consult for Coumadin Indication: LV apical thrombus + CVA,  Allergies  Allergen Reactions  . Heparin Other (See Comments)    Heparin induced thrombocytopenia. 2/15 HIT OD 1.692. 2/16 SRA positive-90.     Patient Measurements: Height: 6\' 1"  (185.4 cm) Weight: 98.7 kg (217 lb 9.5 oz) IBW/kg (Calculated) : 79.9   Vital Signs: Temp: 99.2 F (37.3 C) (04/24 0725) Temp Source: Oral (04/24 0725) BP: 103/60 (04/24 0800) Pulse Rate: 113 (04/24 0800)  Labs: Recent Labs    07/07/19 0634 07/07/19 0634 07/08/19 0458 07-24-2019 0356  HGB 9.5*   < > 8.8* 10.5*  HCT 32.1*  --  30.4* 35.0*  PLT 399  --  358 410*  LABPROT 30.7*  --  39.0* 31.7*  INR 3.0*  --  4.0* 3.1*  CREATININE 4.74*  --  3.12* 3.99*   < > = values in this interval not displayed.    Estimated Creatinine Clearance: 31.9 mL/min (A) (by C-G formula based on SCr of 3.99 mg/dL (H)).  Assessment:  Anticoag: Small LV apical thrombus + CVA, +HIT - on warfarin. *SRA positive (90) - Heparin allergy on chart. Bivalrudin restarted on 4/15  - Goal aPTT 50-85 seconds  4/19: Bival stopped @ 9390 4/19: did not receive Coumadin dose 4/22: INR 2.2>3>4>3.1 today. Hgb up to 10.5. Plts 410 rising.   Goal of Therapy:  INR 2-3 Monitor platelets by anticoagulation protocol: Yes   Plan:  - Resume Coumadin 7.5mg  po x 1 today. - Daily INR, CBC Q72H   Teosha Casso S. Alford Highland, PharmD, BCPS Clinical Staff Pharmacist [email or web address] Alford Highland, Traylen Eckels [place] 07/24/2019,8:10 AM

## 2019-07-16 NOTE — Significant Event (Addendum)
Rapid Response Event Note  Overview: Called d/t BP-85/40. Pt given 5mg  labetolol at 1945 for HR-120s. BP has been 100-120s all day.   Initial Focused Assessment: Pt laying in bed with eyes opened. Pt will track RN around room. Pt is having episodes of hyperventilation intermittently(this is pt's baseline). T-98, HR-70, BP-85/70, RR-40s-50s when hyperventilating, RR-20s when calm, SpO2-94% on .28 TC. Skin warm and diaphoretic. Pt trach sxed x 1-small amount tan secretions.  Interventions: Trach suctioned Plan of Care (if not transferred): Monitor BP and breathing per NP. Call RRT if further assistance needed.  Event Summary:  Bodenheimer, NP notified at [facility]

## 2019-07-16 NOTE — Progress Notes (Addendum)
   Jul 16, 2019 1210  Vitals  Temp 98.1 F (36.7 C)  BP 101/78  MAP (mmHg) 87  BP Location Right Arm  BP Method Automatic  Patient Position (if appropriate) Lying  Pulse Rate (!) 127  Pulse Rate Source Monitor  ECG Heart Rate (!) 121  Resp (!) 26  Oxygen Therapy  SpO2 95 %  MEWS Score  MEWS Temp 0  MEWS Systolic 0  MEWS Pulse 2  MEWS RR 2  MEWS LOC 0  MEWS Score 4  MEWS Score Color Red   Respiratory called to assess patient.  MD made aware of status.  Continue to monitor.

## 2019-07-16 NOTE — Procedures (Signed)
I was present at this dialysis session. I have reviewed the session itself and made appropriate changes.   2L UF, move to 3K bath.  Possibly to transfer to [facility].  NOt accepted at [facility].    Filed Weights   07/07/19 0927 07/07/19 1344 08-01-2019 0725  Weight: 99.4 kg 98.7 kg 98.7 kg    Recent Labs  Lab 2019/08/01 0356  NA 137  K 3.7  CL 96*  CO2 24  GLUCOSE 123*  BUN 57*  CREATININE 3.99*  CALCIUM 10.1    Recent Labs  Lab 07/07/19 0634 07/08/19 0458 08/01/19 0356  WBC 7.6 8.1 14.3*  NEUTROABS 5.6 6.3 12.8*  HGB 9.5* 8.8* 10.5*  HCT 32.1* 30.4* 35.0*  MCV 97.9 96.8 97.0  PLT 399 358 410*    Scheduled Meds: . ARIPiprazole  10 mg Per Tube Daily  . aspirin  81 mg Per Tube Daily  . atorvastatin  40 mg Per Tube q1800  . chlorhexidine gluconate (MEDLINE KIT)  15 mL Mouth Rinse BID  . Chlorhexidine Gluconate Cloth  6 each Topical Q0600  . Chlorhexidine Gluconate Cloth  6 each Topical Q0600  . Darbepoetin Alfa  150 mcg Intravenous Q Mon-1800  . feeding supplement (PRO-STAT SUGAR FREE 64)  60 mL Per Tube BID  . FLUoxetine  20 mg Per Tube Daily  . free water  100 mL Per Tube Q4H  . insulin aspart  0-20 Units Subcutaneous Q4H  . insulin detemir  15 Units Subcutaneous BID  . isosorbide-hydrALAZINE  1 tablet Per Tube BID  . mouth rinse  15 mL Mouth Rinse 10 times per day  . midodrine  10 mg Per Tube Q8H  . pantoprazole sodium  40 mg Per Tube Q1200  . QUEtiapine  50 mg Per Tube QHS  . Resource ThickenUp Clear   Oral Once  . sodium chloride flush  10-40 mL Intracatheter Q12H  . warfarin  7.5 mg Oral ONCE-1600  . Warfarin - Pharmacist Dosing Inpatient   Does not apply q1600   Continuous Infusions: . anticoagulant sodium citrate    . dextrose Stopped (07/03/19 1141)  . dextrose Stopped (07/02/19 1730)  . feeding supplement (VITAL 1.5 CAL) 1,000 mL (07/08/19 1200)   PRN Meds:.acetaminophen, ALPRAZolam, anticoagulant sodium citrate, bisacodyl, fentaNYL (SUBLIMAZE)  injection, lip balm, loperamide HCl, ondansetron (ZOFRAN) IV, polyethylene glycol, sodium chloride flush, white petrolatum   Carlos Grippe  MD 08-01-19, 8:21 AM

## 2019-07-16 NOTE — Progress Notes (Signed)
Pharmacy Antibiotic Note  Carlos Brewer. is a 34 y.o. male admitted on 05/14/2019 with stroke.  Pharmacy had been consulted for Vancomycin/Meropenem dosing for fever and shivering. Patient has had a prolonged hospital stay with previous abx treatment. Patient is ESRD on HD (s/p on off HD/CRRT)  Has tolerated HD TTS this week.  New fever (( today wbc inc wnl >14.  Last ABX ended 4/14.   Reculture, treat for sepsis, HCAP and possible resistant organisms.     Plan: Restart vancomycin 2gm x1 then 1gm TTS with HD Meropenem 500mg  q24h  Height: 6\' 1"  (185.4 cm) Weight: 98.7 kg (217 lb 9.5 oz) IBW/kg (Calculated) : 79.9  Temp (24hrs), Avg:98.5 F (36.9 C), Min:97.9 F (36.6 C), Max:99.2 F (37.3 C)  Recent Labs  Lab 07/05/19 0325 07/06/19 0302 07/07/19 0634 07/08/19 0458 19-Jul-2019 0356  WBC 7.3 6.8 7.6 8.1 14.3*  CREATININE 6.65* 3.78* 4.74* 3.12* 3.99*    Estimated Creatinine Clearance: 31.9 mL/min (A) (by C-G formula based on SCr of 3.99 mg/dL (H)).    Allergies  Allergen Reactions  . Heparin Other (See Comments)    Heparin induced thrombocytopenia. 2/15 HIT OD 1.692. 2/16 SRA positive-90.     Vanc 2/6>>2/9, restart 2/19 >>2/22, 3/17>> 3/20, 3/31>4/3 Cefepime 3/17>> 3/22, 3/31>4/3 Unasyn 4/3>> 4/4 Eraxis 3/17>> 3/20 Zosyn 2/6x1, restart 2/19 >>2/23 Merrem 2/6>>2/12 CTX 3/12 >> 3/16 Vancomycin 4/12>>4/14  Restart 4/24> Meropenem 4/12>> 4/14  Restart 4/24>  Microbiology Results 2/5 covid / flu - negative 2/5 MRSA PCR - positive 2/6 UCx - negative 2/6 TA - negative 2/6 BCx - negative 2/16 TA - negative 2/19 BCx - Neg 2/25 cdiff - neg 2/25 BCx - neg 3/5 TA - neg 3/10 mrsa pcr+ 3/11 Bcx: 1/4 coag neg staph 3/11 Resp Cx: few candida albicans 3/12 BCx - ngtd 3/17 Bcx: NG 3/31 TA: mod GPC in clusters (reincubated) > normal flora 3/31 bcx: ngtd 4/12 Bcx ngF 4/24 Bcx>  Bonnita Nasuti Pharm.D. CPP, BCPS Clinical Pharmacist [phone] 07/19/19 7:59  PM

## 2019-07-16 NOTE — Progress Notes (Signed)
Pt's BP 85/40 with sweating and hyperventilations. Mandy (RR) and Bodenheimer informed.

## 2019-07-16 NOTE — Progress Notes (Signed)
Pt's MEWS red noted upon start of shift, pt noted to have elevated RR, BP, and HR. Per shift report vital signs elevated since morning time, MD aware and prn meds ordered. Will conti to monitor and follow pt per MEWS protocol.

## 2019-07-16 NOTE — Progress Notes (Addendum)
   07-19-19 1635  Vitals  Temp 99 F (37.2 C)  Temp Source Tympanic  BP (!) 110/46  MAP (mmHg) (!) 60  BP Location Right Arm  BP Method Automatic  Patient Position (if appropriate) Lying  Pulse Rate (!) 122  Pulse Rate Source Monitor  ECG Heart Rate (!) 122  Resp (!) 28  Oxygen Therapy  SpO2 100 %  O2 Device Partial Rebreather Mask  O2 Flow Rate (L/min) 5 L/min  FiO2 (%) 28 %  MEWS Score  MEWS Temp 0  MEWS Systolic 0  MEWS Pulse 2  MEWS RR 2  MEWS LOC 0  MEWS Score 4  MEWS Score Color Red   MD was made aware.  Stated will contact respiratory for assessment. MD aware that patient's mother is feeding him inappropriate fluids.  I.e., she is giving him ice cream and unthickened water.  He is noted to have increased coughing, but unable to suction.  Lungs clear TA.  He appears anxious and is restless.  Afebrile at this time.  Will continue to monitor.  Patient placed on NPO status for safety.  Mother was educated as to plan of care and rational for returning to NPO status for now.  MD also in agreement with plan.

## 2019-07-16 DEATH — deceased

## 2020-03-25 IMAGING — CR DG HAND COMPLETE 3+V*L*
3 series · 3 of 3 positions shown · non-contrast
Comparison: None.

CLINICAL DATA: Left hand injury with abrasions between the fourth
and fifth digits.

EXAM:
LEFT HAND - COMPLETE 3+ VIEW

[hand ap]
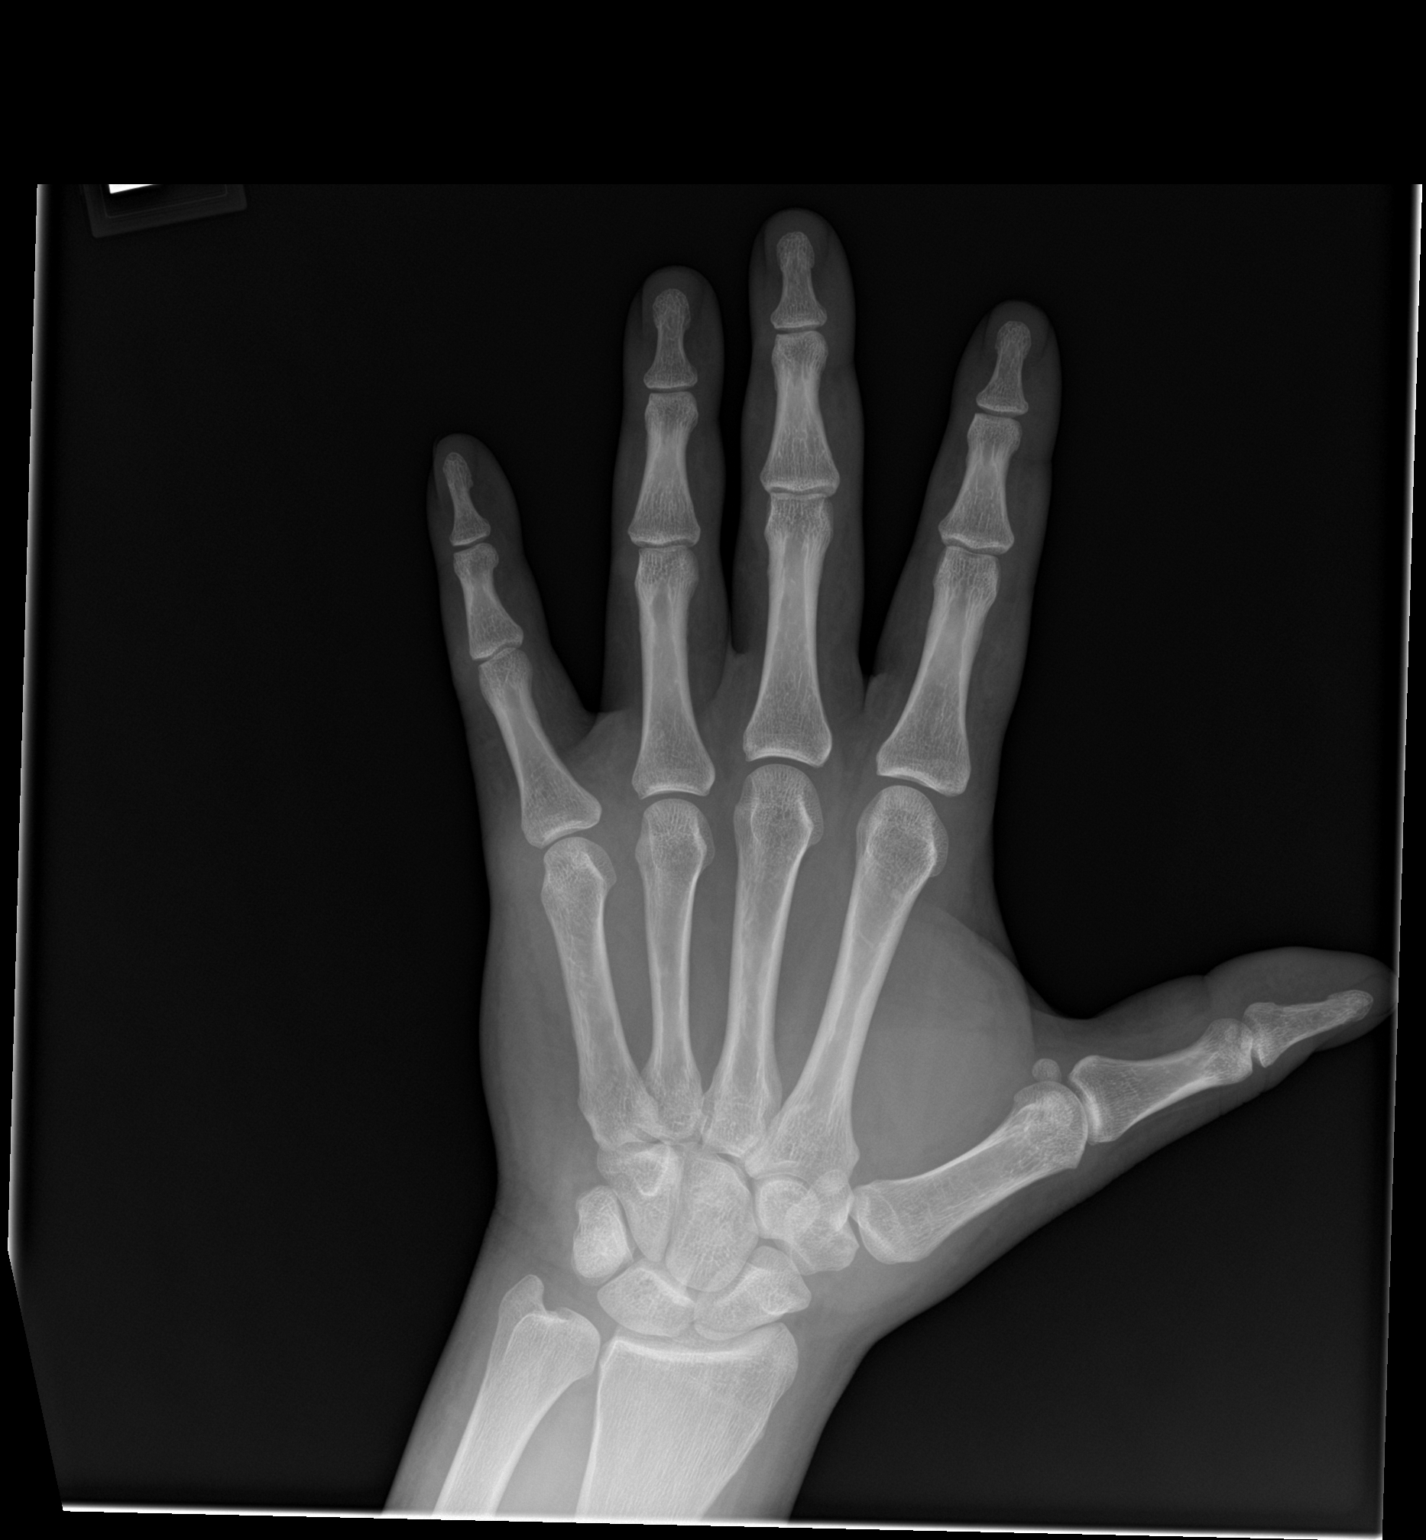

[hand obl]
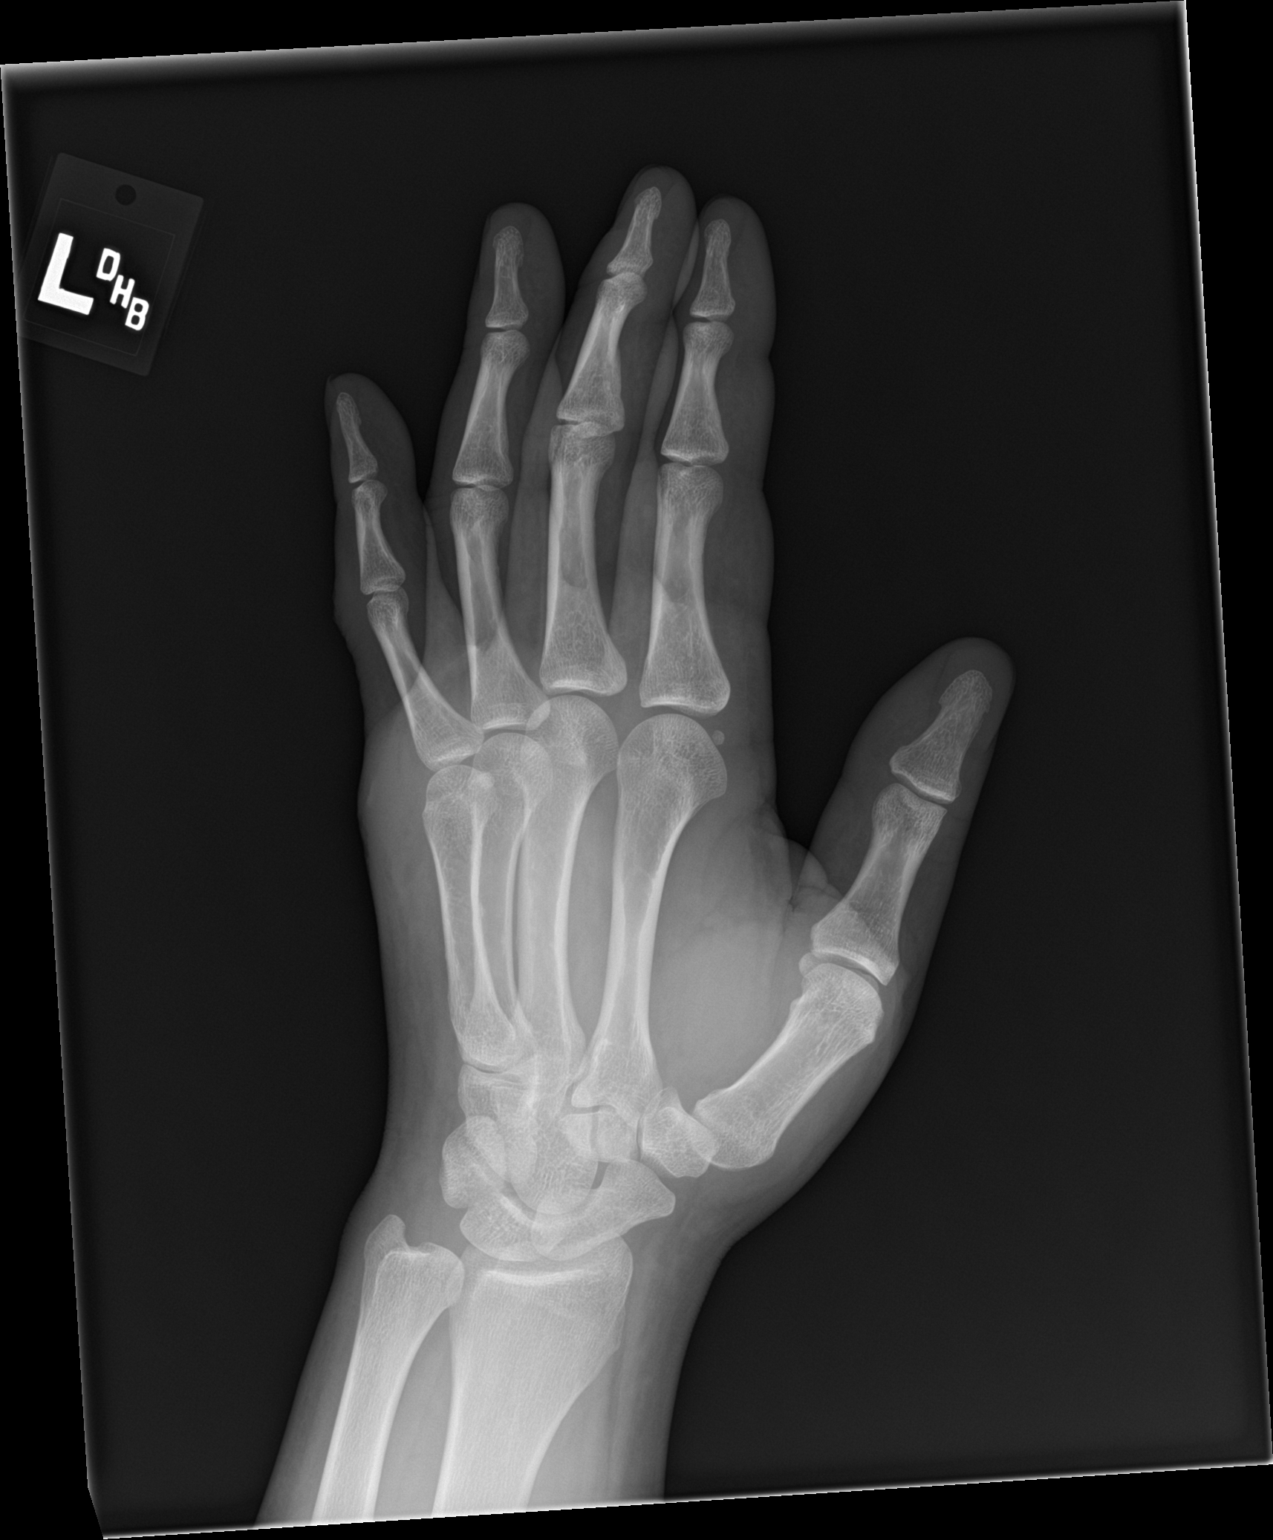

[hand lat]
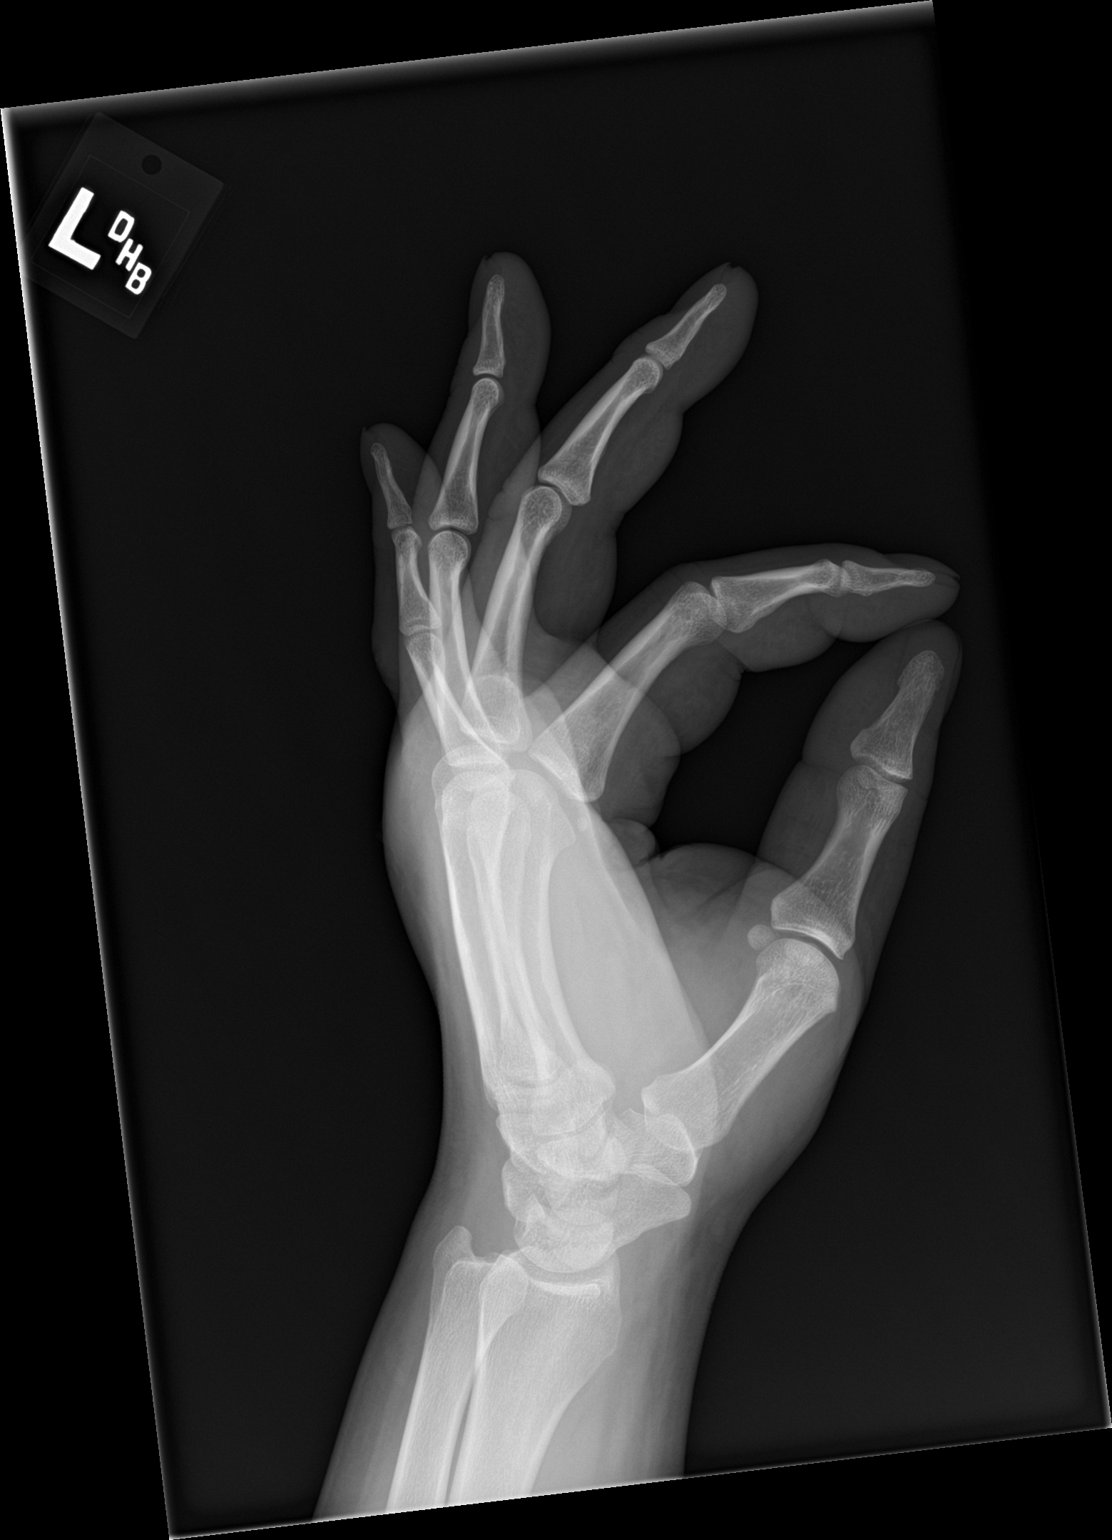

[3 of 3 positions shown; findings below may reference images not displayed]

FINDINGS: Soft tissue swelling over the dorsum of the left hand. No underlying
fractures identified. No focal bone lesion or bone destruction. Bone
cortex appears intact. No radiopaque soft tissue foreign bodies.
IMPRESSION: Soft tissue swelling. No acute bony abnormalities.

## 2021-03-30 IMAGING — CR CHEST - 2 VIEW
3 series · 3 of 3 positions shown · non-contrast
Comparison: 06/13/2018, 04/10/2018, CT 04/10/2018

CLINICAL DATA: 33-year-old male with shortness of breath

EXAM:
CHEST - 2 VIEW

[w chest pa]
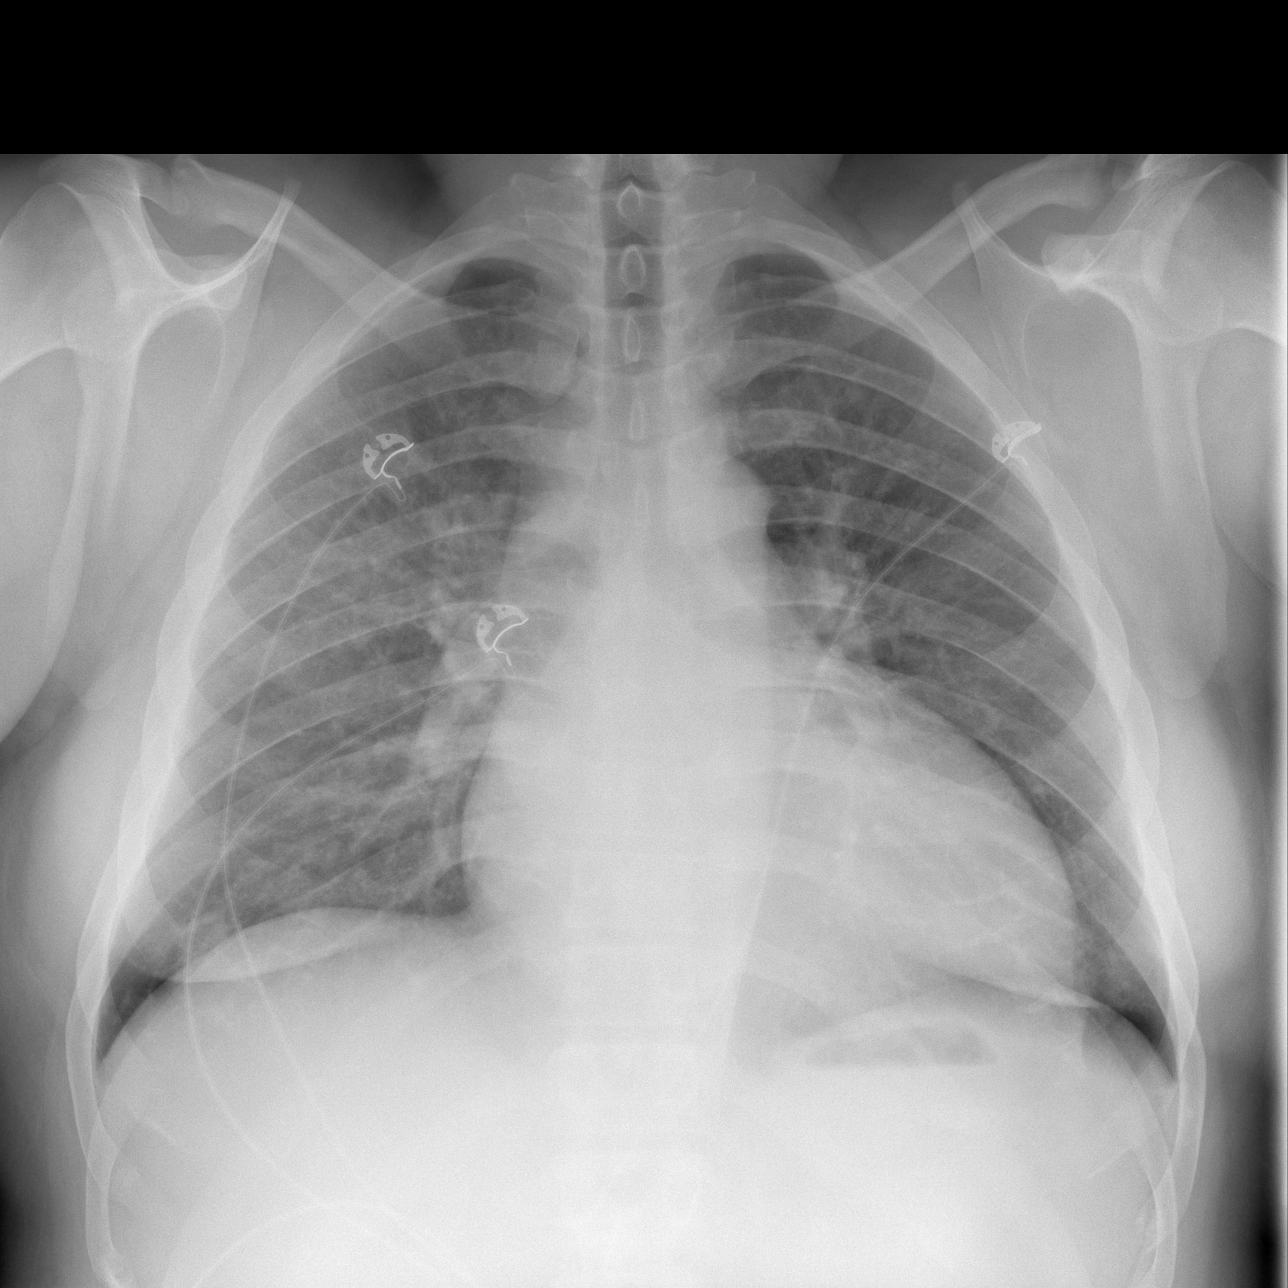

[w chest lat]
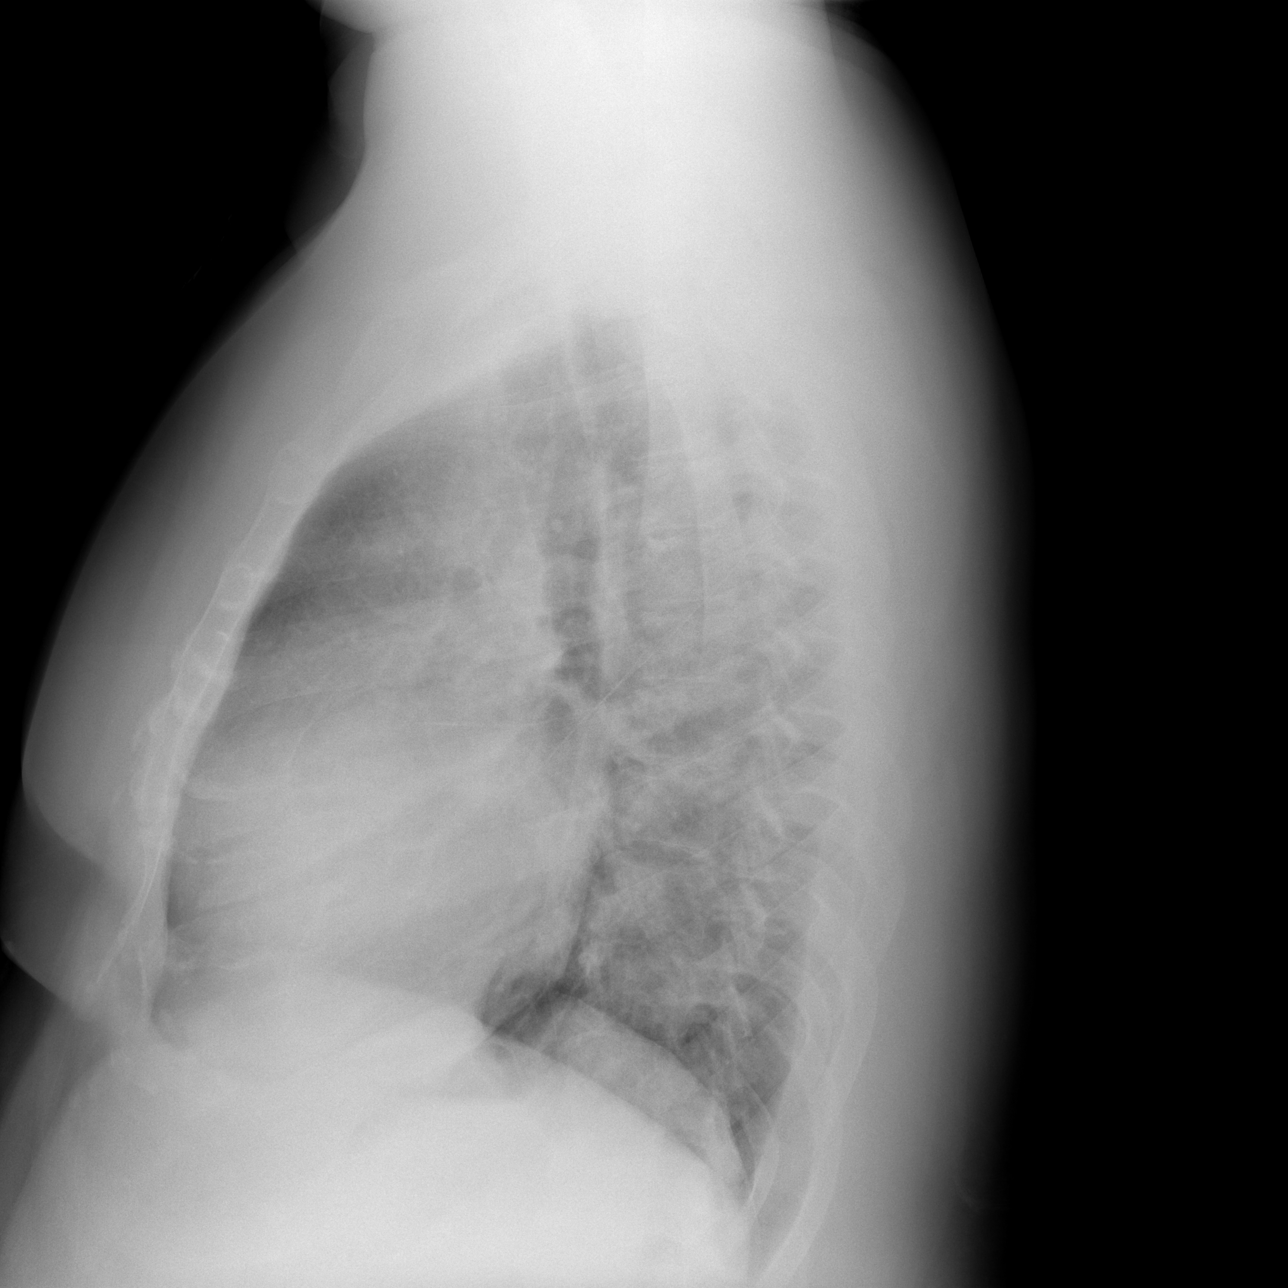

[w chest lat *]
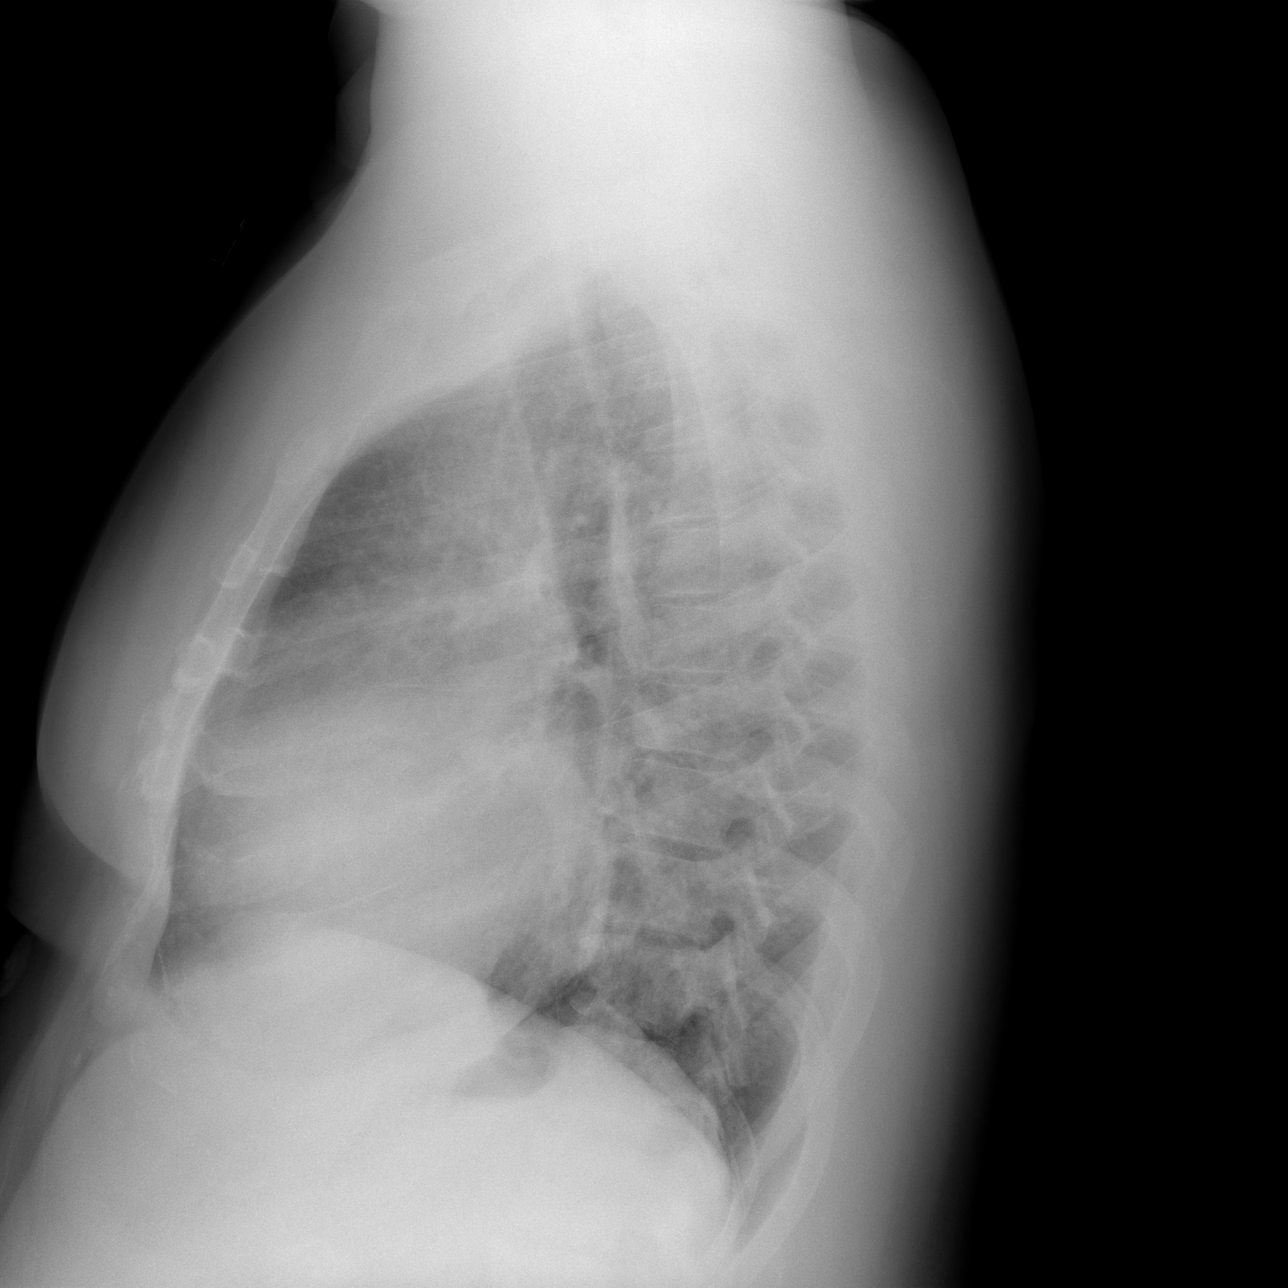

[3 of 3 positions shown; findings below may reference images not displayed]

FINDINGS: Cardiomediastinal silhouette unchanged in size and contour. Fullness
in the central vasculature with peribronchial thickening. Reticular
opacities. No evidence of pleural effusion or pneumothorax. No
displaced fracture
IMPRESSION: Ill-defined reticular opacities bilaterally, may represent early
pulmonary edema, or potentially infection. Correlation with lab
values may be useful.

Cardiomegaly
